# Patient Record
Sex: Female | Born: 2000
Health system: Southern US, Community
[De-identification: ages and names within clinical notes are randomized; demographics above are authoritative.]

## PROBLEM LIST (undated history)

## (undated) ENCOUNTER — Emergency Department: Admission: EM | Payer: Medicaid Other | Source: Home / Self Care

## (undated) ENCOUNTER — Emergency Department (HOSPITAL_COMMUNITY): Admission: EM | Payer: Medicaid Other

## (undated) DIAGNOSIS — E669 Obesity, unspecified: Secondary | ICD-10-CM

## (undated) DIAGNOSIS — E119 Type 2 diabetes mellitus without complications: Secondary | ICD-10-CM

## (undated) DIAGNOSIS — G8929 Other chronic pain: Secondary | ICD-10-CM

## (undated) DIAGNOSIS — E1010 Type 1 diabetes mellitus with ketoacidosis without coma: Secondary | ICD-10-CM

## (undated) DIAGNOSIS — F32A Depression, unspecified: Secondary | ICD-10-CM

## (undated) DIAGNOSIS — R1013 Epigastric pain: Secondary | ICD-10-CM

## (undated) DIAGNOSIS — I509 Heart failure, unspecified: Secondary | ICD-10-CM

## (undated) DIAGNOSIS — N83209 Unspecified ovarian cyst, unspecified side: Secondary | ICD-10-CM

## (undated) DIAGNOSIS — F329 Major depressive disorder, single episode, unspecified: Secondary | ICD-10-CM

## (undated) DIAGNOSIS — F419 Anxiety disorder, unspecified: Secondary | ICD-10-CM

## (undated) DIAGNOSIS — R569 Unspecified convulsions: Secondary | ICD-10-CM

## (undated) DIAGNOSIS — M545 Low back pain, unspecified: Secondary | ICD-10-CM

## (undated) DIAGNOSIS — L83 Acanthosis nigricans: Secondary | ICD-10-CM

## (undated) DIAGNOSIS — R7303 Prediabetes: Secondary | ICD-10-CM

## (undated) DIAGNOSIS — E270 Other adrenocortical overactivity: Secondary | ICD-10-CM

## (undated) HISTORY — PX: HERNIA REPAIR: SHX51

## (undated) HISTORY — DX: Prediabetes: R73.03

## (undated) HISTORY — DX: Acanthosis nigricans: L83

## (undated) HISTORY — DX: Epigastric pain: R10.13

## (undated) HISTORY — PX: TONSILLECTOMY AND ADENOIDECTOMY: SUR1326

## (undated) HISTORY — DX: Other adrenocortical overactivity: E27.0

## (undated) HISTORY — DX: Unspecified ovarian cyst, unspecified side: N83.209

## (undated) HISTORY — PX: ABDOMINAL HERNIA REPAIR: SHX539

## (undated) HISTORY — DX: Obesity, unspecified: E66.9

---
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
## 2018-01-04 ENCOUNTER — Other Ambulatory Visit: Payer: Self-pay | Admitting: Critical Care Medicine

## 2018-01-07 ENCOUNTER — Telehealth: Payer: Self-pay | Admitting: Nurse Practitioner

## 2018-01-07 NOTE — Telephone Encounter (Signed)
Patient called with chest pain I informed her to go to the ER she sated that she already did and that she needs to fu with PCP she has an appointment on 1/14

## 2018-01-08 MED FILL — CEPHALEXIN 500 MG CAPSULE: 500 | 7 days supply | Qty: 21 | Fill #0

## 2018-01-09 ENCOUNTER — Other Ambulatory Visit: Payer: Self-pay

## 2018-01-09 ENCOUNTER — Encounter (HOSPITAL_COMMUNITY): Payer: Self-pay

## 2018-01-09 ENCOUNTER — Emergency Department (HOSPITAL_COMMUNITY): Payer: Medicaid Other

## 2018-01-09 ENCOUNTER — Emergency Department (HOSPITAL_COMMUNITY)
Admission: EM | Admit: 2018-01-09 | Discharge: 2018-01-09 | Disposition: A | Discharge status: Home / Self Care | Payer: Medicaid Other | Attending: Emergency Medicine | Admitting: Emergency Medicine

## 2018-01-09 DIAGNOSIS — I509 Heart failure, unspecified: Secondary | ICD-10-CM | POA: Diagnosis not present

## 2018-01-09 DIAGNOSIS — Z794 Long term (current) use of insulin: Secondary | ICD-10-CM | POA: Diagnosis not present

## 2018-01-09 DIAGNOSIS — E119 Type 2 diabetes mellitus without complications: Secondary | ICD-10-CM | POA: Diagnosis not present

## 2018-01-09 DIAGNOSIS — R1084 Generalized abdominal pain: Secondary | ICD-10-CM | POA: Diagnosis not present

## 2018-01-09 DIAGNOSIS — R109 Unspecified abdominal pain: Secondary | ICD-10-CM

## 2018-01-09 DIAGNOSIS — R072 Precordial pain: Secondary | ICD-10-CM | POA: Insufficient documentation

## 2018-01-09 DIAGNOSIS — Z79899 Other long term (current) drug therapy: Secondary | ICD-10-CM | POA: Insufficient documentation

## 2018-01-09 DIAGNOSIS — I110 Hypertensive heart disease with heart failure: Secondary | ICD-10-CM | POA: Diagnosis not present

## 2018-01-09 DIAGNOSIS — R0789 Other chest pain: Secondary | ICD-10-CM | POA: Diagnosis present

## 2018-01-09 DIAGNOSIS — G8929 Other chronic pain: Secondary | ICD-10-CM | POA: Insufficient documentation

## 2018-01-09 LAB — CBC
HCT: 44.4 % (ref 36.0–46.0)
HEMOGLOBIN: 14.4 g/dL (ref 12.0–15.0)
MCH: 26.9 pg (ref 26.0–34.0)
MCHC: 32.4 g/dL (ref 30.0–36.0)
MCV: 82.8 fL (ref 80.0–100.0)
Platelets: 319 10*3/uL (ref 150–400)
RBC: 5.36 MIL/uL — ABNORMAL HIGH (ref 3.87–5.11)
RDW: 13.8 % (ref 11.5–15.5)
WBC: 7.1 10*3/uL (ref 4.0–10.5)
nRBC: 0 % (ref 0.0–0.2)

## 2018-01-09 LAB — BASIC METABOLIC PANEL
Anion gap: 10 (ref 5–15)
BUN: 8 mg/dL (ref 6–20)
CO2: 24 mmol/L (ref 22–32)
Calcium: 9.3 mg/dL (ref 8.9–10.3)
Chloride: 97 mmol/L — ABNORMAL LOW (ref 98–111)
Creatinine, Ser: 0.64 mg/dL (ref 0.44–1.00)
GFR calc non Af Amer: >60 mL/min (ref >60)
Glucose, Bld: 622 mg/dL (ref 70–99)
POTASSIUM: 3.9 mmol/L (ref 3.5–5.1)
Sodium: 131 mmol/L — ABNORMAL LOW (ref 135–145)

## 2018-01-09 LAB — I-STAT BETA HCG BLOOD, ED (MC, WL, AP ONLY): I-stat hCG, quantitative: <5 m[IU]/mL (ref <5)

## 2018-01-09 LAB — I-STAT TROPONIN, ED
TROPONIN I, POC: 0 ng/mL (ref 0.00–0.08)
Troponin i, poc: 0 ng/mL (ref 0.00–0.08)

## 2018-01-09 LAB — CBG MONITORING, ED: GLUCOSE-CAPILLARY: 245 mg/dL — AB (ref 70–99)

## 2018-01-09 MED ORDER — SODIUM CHLORIDE 0.9 % IV BOLUS
1000.0000 mL | Freq: Once | INTRAVENOUS | Status: AC
  Administered 2018-01-09: 1000 mL via INTRAVENOUS

## 2018-01-09 MED ORDER — INSULIN ASPART 100 UNIT/ML ~~LOC~~ SOLN
10.0000 [IU] | Freq: Once | SUBCUTANEOUS | Status: AC
  Administered 2018-01-09: 10 [IU] via SUBCUTANEOUS

## 2018-01-09 MED ORDER — KETAMINE HCL 50 MG/5ML IJ SOSY
0.3000 mg/kg | PREFILLED_SYRINGE | Freq: Once | INTRAMUSCULAR | Status: AC
  Administered 2018-01-09: 25 mg via INTRAVENOUS
  Filled 2018-01-09: qty 5

## 2018-01-09 MED ORDER — HALOPERIDOL LACTATE 5 MG/ML IJ SOLN
5.0000 mg | Freq: Once | INTRAMUSCULAR | Status: AC
  Administered 2018-01-09: 5 mg via INTRAVENOUS
  Filled 2018-01-09: qty 1

## 2018-01-09 MED ORDER — PROCHLORPERAZINE EDISYLATE 10 MG/2ML IJ SOLN
10.0000 mg | Freq: Once | INTRAMUSCULAR | Status: AC
  Administered 2018-01-09: 10 mg via INTRAVENOUS
  Filled 2018-01-09: qty 2

## 2018-01-09 MED ORDER — DIPHENHYDRAMINE HCL 50 MG/ML IJ SOLN
25.0000 mg | Freq: Once | INTRAMUSCULAR | Status: AC
  Administered 2018-01-09: 25 mg via INTRAVENOUS
  Filled 2018-01-09: qty 1

## 2018-01-09 MED ORDER — PROMETHAZINE HCL 25 MG PO TABS: 25.0000 mg | ORAL_TABLET | Freq: Four times a day (QID) | ORAL | 0 refills | Status: DC | PRN

## 2018-01-09 NOTE — Discharge Instructions (Signed)
You were seen in the ER for chest and abdominal pain.   I reviewed your most recent tests including MRI brain, CT chest, abdomen, pelvis, gastric emptying studies.  Cause of  your symptoms is unclear.   Take [phone] mg acetaminophen for pain. Phenergan for nausea and vomiting.   Follow up with gastroenterology and cardiology for further discussion of your symptoms.   Return to ER for fever, cough, phlegm, blood in vomit or stool, lower extremity swelling.

## 2018-01-09 NOTE — ED Notes (Signed)
Pt tolerating ginger ale, given sandwich. OK per [place], [place]

## 2018-01-09 NOTE — ED Triage Notes (Signed)
To  Triage via EMS.  Onset 3 weeks mid chest pain and abd pain.  Syncopal episode x 3   Pt has been seen in ED and has followup appt 01-19-18

## 2018-01-09 NOTE — ED Provider Notes (Addendum)
[facility] EMERGENCY DEPARTMENT Provider Note   CSN: [id number] Arrival date & time: 01/09/18  1506     History   Chief Complaint Chief Complaint  Patient presents with  . Chest Pain  . Abdominal Pain    HPI Nancy [name] is a 18 y.o. female with h/o poorly controlled DM on insulin, chronic abdominal pain, chronic chest pain, sinus tachycardia, syncope, is here for evaluation of chest pain and abdominal pain. On and off for 1 month. Acutely worsening this morning. It is 10/10, sharp, constant since onset this morning.  Associated with nausea, nbnb emesis.  Her chest pain is described as sharp, left sided, radiating to right chest and left scapula.  Worse with movement and walking around.  Her abdominal pain is sharp, located to umbilicus, non radiating, severe, 10/10.  Last vomited last night.  Nothing makes it better.  Sometimes heating pad to abdomen improves the abdominal pain and nausea. Sometimes she feels short of breath that she describes as "feeling like i'm going to have a panic attack".  States she has passed out more than 10 times this week, states her mom told her she has had 3 syncopal episodes today.  States she "passes out" randomly, sometimes at rest, while talking, standing up. When she wakes up she states she doesn't remember anything.  She has been compliant with lantus 60 units qhs and humalog 10 units BID with meals however last use yesterday.  She denies fevers, chills, cough, diarrhea, melena, dysuria, frequency, urgency. No ETOH or marijuana use. Does not take anything for pain at home such as acetaminophen, ibuprofen. LMP 12/14.  States ketamine helped her symptoms last time.  No h/o DVT/PE, recent prolonged travel, lower extremity edema or calf pain. Recent admission for syncope, chest pain, tachycardia.   HPI  Past Medical History:  Diagnosis Date  . Acanthosis nigricans   . Anxiety   . CHF (congestive heart failure) ([place])   . Chronic lower  back pain   . Depression   . Dyspepsia   . Obesity   . Ovarian cyst    pt is not aware of this hx (11/24/2017)  . Pre-diabetes   . 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 Premature baby   . Type II diabetes mellitus ([facility])    insulin dependant    Patient Active Problem List   Diagnosis Date Noted  . Chronic abdominal pain 12/24/2017  . Sinus tachycardia by electrocardiogram   . Acute lower UTI   . Uncontrolled type 2 diabetes mellitus with hyperglycemia ([place])   . Chest pain 12/19/2017  . Generalized abdominal pain 08/21/2017  . Non compliance with medical treatment 01/27/2012  . Adjustment disorder 09/16/2011  . Dyspepsia   . Acanthosis nigricans   . Goiter   . Obesity 06/14/2010  . Hypertension 06/14/2010    Past Surgical History:  Procedure Laterality Date  . ABDOMINAL HERNIA REPAIR     "I was a baby"  . HERNIA REPAIR    . TONSILLECTOMY AND ADENOIDECTOMY    . WISDOM TOOTH EXTRACTION  2017     OB History   No obstetric history on file.      Home Medications    Prior to Admission medications   Medication Sig Start Date End Date Taking? Authorizing Provider  amLODipine (NORVASC) 5 MG tablet Take 1 tablet (5 mg total) by mouth daily. 12/02/17   [name], NP  Blood Glucose Monitoring Suppl (TRUE METRIX METER) w/Device KIT Use as  instructed. Monitor blood glucose levels twice per day 12/02/17   [name], NP  diclofenac sodium (VOLTAREN) 1 % GEL Apply 2 g topically 4 (four) times daily. Patient taking differently: Apply 2 g topically See admin instructions. Apply 2 grams to painful areas of chest and/or stomach four times a day 12/20/17   Debbe Odea, MD  glucagon 1 MG injection Use for Severe Hypoglycemia . Inject 1 mg intramuscularly if unresponsive, unable to swallow, unconscious and/or has seizure Patient not taking: Reported on 12/28/2017 07/20/13 12/19/25  Renato Shin, MD  glucose blood (TRUE METRIX BLOOD GLUCOSE TEST) test strip Use as instructed  12/02/17   [name], NP  Insulin Glargine (LANTUS) 100 UNIT/ML Solostar Pen Inject 60 Units into the skin daily. And pen needles 2/day Patient taking differently: Inject 60 Units into the skin at bedtime.  12/02/17   [name], NP  insulin lispro (HUMALOG) 100 UNIT/ML injection Inject 10 Units into the skin 2 (two) times daily with a meal.     [provider]  Insulin Pen Needle (B-D UF III MINI PEN NEEDLES) 31G X 5 MM MISC Use as instructed. Monitor blood glucose levels twice per day 12/02/17   [name], NP  meloxicam (MOBIC) 15 MG tablet Take 1 tablet (15 mg total) by mouth daily. 12/28/17   Elsie Stain, MD  metoprolol tartrate (LOPRESSOR) 25 MG tablet Take 25 mg by mouth 2 (two) times daily.    [provider]  promethazine (PHENERGAN) 25 MG tablet Take 1 tablet (25 mg total) by mouth every 6 (six) hours as needed for nausea or vomiting. 01/09/18   Kinnie Feil, PA-C  QUEtiapine (SEROQUEL) 100 MG tablet Take 100 mg by mouth at bedtime.    [provider]  TRUEPLUS LANCETS 28G MISC Use as instructed. Monitor blood glucose levels twice per day 12/02/17   [name], NP    Family History Family History  Problem Relation Age of Onset  . Diabetes Mother   . Hypertension Mother   . Obesity Mother   . Asthma Mother   . Allergic rhinitis Mother   . Eczema Mother   . Diabetes Father   . Hypertension Father   . Obesity Father   . Hyperlipidemia Father   . Hypertension Paternal Aunt   . Hypertension Maternal Grandfather   . Colon cancer Maternal Grandfather   . Diabetes Paternal Grandmother   . Obesity Paternal Grandmother   . Diabetes Paternal Grandfather   . Obesity Paternal Grandfather   . Angioedema Neg Hx   . Immunodeficiency Neg Hx   . Urticaria Neg Hx   . Stomach cancer Neg Hx   . Esophageal cancer Neg Hx     Social History Social History   Tobacco Use  . Smoking status: Never Smoker  . Smokeless tobacco:  Never Used  Substance Use Topics  . Alcohol use: No    Alcohol/week: 0.0 standard drinks  . Drug use: No     Allergies   Patient has no known allergies.   Review of Systems Review of Systems  Respiratory: Positive for chest tightness and shortness of breath.   Cardiovascular: Positive for chest pain.  Gastrointestinal: Positive for abdominal pain, nausea and vomiting.  Neurological: Positive for syncope and light-headedness.  All other systems reviewed and are negative.    Physical Exam Updated Vital Signs BP 118/76   Pulse (!) 112   Temp 98.9 F (37.2 C) (Oral)   Resp  18   Wt 83 kg   LMP 12/19/2017   SpO2 100%   BMI 32.41 kg/m   Physical Exam Constitutional:      Appearance: She is well-developed.     Comments: NAD. Non toxic.   HENT:     Head: Normocephalic and atraumatic.     Nose: Nose normal.  Eyes:     General: Lids are normal.     Conjunctiva/sclera: Conjunctivae normal.  Neck:     Musculoskeletal: Normal range of motion.     Trachea: Trachea normal.     Comments: Trachea midline.  Cardiovascular:     Rate and Rhythm: Regular rhythm. Tachycardia present.     Pulses:          Carotid pulses are 2+ on the right side and 2+ on the left side.      Radial pulses are 2+ on the right side and 2+ on the left side.       Dorsalis pedis pulses are 2+ on the right side and 2+ on the left side.     Heart sounds: Normal heart sounds, S1 normal and S2 normal.     Comments: Slight left chest wall and scapular tenderness. No overlaying rash. Equal rise/fall of chest. No LE edema or calf tenderness.  Pulmonary:     Effort: Pulmonary effort is normal.     Breath sounds: Normal breath sounds.  Abdominal:     General: Bowel sounds are normal.     Palpations: Abdomen is soft.     Tenderness: There is abdominal tenderness.     Comments: Generalized abdominal tenderness, mild. No rigidity, guarding. Negative Murphy's and McBurneys. No CVA or suprapubic tenderness.     Skin:    General: Skin is warm and dry.     Capillary Refill: Capillary refill takes less than 2 seconds.     Comments: No rash to chest wall  Neurological:     Mental Status: She is alert.     GCS: GCS eye subscore is 4. GCS verbal subscore is 5. GCS motor subscore is 6.  Psychiatric:        Speech: Speech normal.        Behavior: Behavior normal.        Thought Content: Thought content normal.      ED Treatments / Results  Labs (all labs ordered are listed, but only abnormal results are displayed) Labs Reviewed  BASIC METABOLIC PANEL - Abnormal; Notable for the following components:      Result Value   Sodium 131 (*)    Chloride 97 (*)    Glucose, Bld 622 (*)    All other components within normal limits  CBC - Abnormal; Notable for the following components:   RBC 5.36 (*)    All other components within normal limits  CBG MONITORING, ED - Abnormal; Notable for the following components:   Glucose-Capillary 245 (*)    All other components within normal limits  I-STAT TROPONIN, ED  I-STAT BETA HCG BLOOD, ED ([facility], [facility], [facility] ONLY)  I-STAT TROPONIN, ED    EKG None  Radiology Dg Chest 2 View  Result Date: 01/09/2018 CLINICAL DATA:  Chest pain, shortness of breath and vomiting. EXAM: CHEST - 2 VIEW COMPARISON:  12/23/2017 FINDINGS: The heart size and mediastinal contours are within normal limits. There is no evidence of pulmonary edema, consolidation, pneumothorax, nodule or pleural fluid. The visualized skeletal structures are unremarkable. IMPRESSION: No active cardiopulmonary disease. Electronically Signed   By:  Aletta Edouard M.D.   On: 01/09/2018 15:58    Procedures Procedures (including critical care time)  Medications Ordered in ED Medications  sodium chloride 0.9 % bolus 1,000 mL (0 mLs Intravenous Stopped 01/09/18 1912)  insulin aspart (novoLOG) injection 10 Units (10 Units Subcutaneous Given 01/09/18 1806)  haloperidol lactate (HALDOL) injection 5 mg (5 mg Intravenous  Given 01/09/18 1924)  prochlorperazine (COMPAZINE) injection 10 mg (10 mg Intravenous Given 01/09/18 1924)  diphenhydrAMINE (BENADRYL) injection 25 mg (25 mg Intravenous Given 01/09/18 1924)  ketamine 50 mg in normal saline 5 mL (10 mg/mL) syringe (25 mg Intravenous Given 01/09/18 2044)     Initial Impression / Assessment and Plan / ED Course  I have reviewed the triage vital signs and the nursing notes.  Pertinent labs & imaging results that were available during my care of the patient were reviewed by me and considered in my medical decision making (see chart for details).    18 yo with acute on chronic chest and abd pain, nausea, vomiting.   Pt was admitted/discharged on 12/21 for syncope, chest pain, tachycardia, abdominal pain. At that time echo showed EF 45% with global hypokinesis, cardiology was consulted and recommended outpatient work up.  She has upcoming coronary cardiac CT on 01/09.   CTA and d-dimer last month negative.  MRI brain 11/2017 negative, unclear etiology of syncope. Normal gastric emptying study 05/2017. CT [facility] last month unremarkable. On exam today she has generalized abd tenderness but no peritonitis.  Cardiopulmonary exam unremarkable w/o signs of DVT.  She is tachycardic but this is at her baseline. No tachypnea, hypoxia.  I doubt cardiac ischemia, PE, acute intraabdominal process given recent extensive work up.  She is afebrile.    No leukocytosis.  Trop x 2 undetectable during constant CP.  CXR w/o infection or pulmonary edema.  Hyperglycemia w/o DKA/HHS.  CBG improved with IVF and insulin.  Cp/Abd pain and tenderness on exam improved with ketamine. She had full meal in ER w/o emesis.  Tachycardia improved.  We will dc with rec to f/u with cardiology and GI for ongoing management of chronic CP/abdominal pain. Return precautions discussed. Pt is comfortable with this plan.   Final Clinical Impressions(s) / ED Diagnoses   Final diagnoses:  Precordial pain  Chronic abdominal  pain    ED Discharge Orders         Ordered    promethazine (PHENERGAN) 25 MG tablet  Every 6 hours PRN     01/09/18 2114           Kinnie Feil, PA-C 01/09/18 2117    Margette Fast, MD 01/10/18 8502    Kinnie Feil, PA-C 01/28/18 1934    Long, [place] [employer], MD 01/30/18 [phone]

## 2018-01-13 ENCOUNTER — Telehealth (HOSPITAL_COMMUNITY): Payer: Self-pay | Admitting: Emergency Medicine

## 2018-01-13 NOTE — Telephone Encounter (Signed)
Reaching out to patient to offer assistance regarding upcoming cardiac imaging study; pt verbalizes understanding of appt date/time, parking situation and where to check in, pre-test NPO status and medications ordered, and verified current allergies; name and call back number provided for further questions should they arise Rodriques Badie RN Navigator Cardiac Imaging [phone] 

## 2018-01-14 ENCOUNTER — Ambulatory Visit (HOSPITAL_COMMUNITY)
Admission: RE | Admit: 2018-01-14 | Discharge: 2018-01-14 | Disposition: A | Discharge status: Home / Self Care | Payer: Medicaid Other | Source: Ambulatory Visit | Attending: Critical Care Medicine | Admitting: Critical Care Medicine

## 2018-01-14 DIAGNOSIS — Z006 Encounter for examination for normal comparison and control in clinical research program: Secondary | ICD-10-CM

## 2018-01-14 DIAGNOSIS — R072 Precordial pain: Secondary | ICD-10-CM

## 2018-01-14 DIAGNOSIS — I10 Essential (primary) hypertension: Secondary | ICD-10-CM

## 2018-01-14 MED ORDER — NITROGLYCERIN 0.4 MG SL SUBL
SUBLINGUAL_TABLET | SUBLINGUAL | Status: AC
  Filled 2018-01-14: qty 2

## 2018-01-14 MED ORDER — DILTIAZEM HCL 25 MG/5ML IV SOLN
5.0000 mg | Freq: Once | INTRAVENOUS | Status: AC
  Administered 2018-01-14: 5 mg via INTRAVENOUS
  Filled 2018-01-14: qty 5

## 2018-01-14 MED ORDER — IOPAMIDOL (ISOVUE-370) INJECTION 76%
100.0000 mL | Freq: Once | INTRAVENOUS | Status: AC | PRN
  Administered 2018-01-14: 100 mL via INTRAVENOUS

## 2018-01-14 MED ORDER — DILTIAZEM HCL 25 MG/5ML IV SOLN
INTRAVENOUS | Status: AC
  Administered 2018-01-14: 5 mg
  Filled 2018-01-14: qty 5

## 2018-01-14 MED ORDER — METOPROLOL TARTRATE 5 MG/5ML IV SOLN
INTRAVENOUS | Status: AC
  Filled 2018-01-14: qty 10

## 2018-01-14 MED ORDER — DILTIAZEM HCL 25 MG/5ML IV SOLN
10.0000 mg | Freq: Once | INTRAVENOUS | Status: AC
  Administered 2018-01-14: 5 mg via INTRAVENOUS
  Filled 2018-01-14: qty 5

## 2018-01-14 MED ORDER — DILTIAZEM LOAD VIA INFUSION: 10.0000 mg | Freq: Once | INTRAVENOUS | Status: DC

## 2018-01-14 MED ORDER — NITROGLYCERIN 0.4 MG SL SUBL
0.8000 mg | SUBLINGUAL_TABLET | Freq: Once | SUBLINGUAL | Status: AC
  Administered 2018-01-14: 0.8 mg via SUBLINGUAL
  Filled 2018-01-14: qty 25

## 2018-01-14 MED ORDER — METOPROLOL TARTRATE 5 MG/5ML IV SOLN
10.0000 mg | INTRAVENOUS | Status: DC | PRN
  Administered 2018-01-14: 10 mg via INTRAVENOUS
  Filled 2018-01-14: qty 10

## 2018-01-14 NOTE — Research (Signed)
[name] met inclusion and exclusion criteria.  The informed consent form, study requirements and expectations were reviewed with the subject and questions and concerns were addressed prior to the signing of the consent form.  The subject verbalized understanding of the trial requirements.  The subject agreed to participate in the [email or web address] trial and signed the informed consent.  The informed consent was obtained prior to performance of any protocol-specific procedures for the subject.  A copy of the signed informed consent was given to the subject and a copy was placed in the subject's medical record.   Dionne Bucy. [employer]

## 2018-01-14 NOTE — Progress Notes (Signed)
CT scan completed. Tolerated well. D/C home walking with mother. Awake and alert. In no distress. 

## 2018-01-16 ENCOUNTER — Encounter (HOSPITAL_COMMUNITY): Payer: Self-pay

## 2018-01-16 ENCOUNTER — Other Ambulatory Visit: Payer: Self-pay

## 2018-01-16 ENCOUNTER — Emergency Department (HOSPITAL_COMMUNITY)
Admission: EM | Admit: 2018-01-16 | Discharge: 2018-01-16 | Disposition: A | Discharge status: Home / Self Care | Payer: Medicaid Other | Attending: Emergency Medicine | Admitting: Emergency Medicine

## 2018-01-16 ENCOUNTER — Emergency Department (HOSPITAL_COMMUNITY): Payer: Medicaid Other

## 2018-01-16 DIAGNOSIS — R000 Tachycardia, unspecified: Secondary | ICD-10-CM

## 2018-01-16 DIAGNOSIS — I110 Hypertensive heart disease with heart failure: Secondary | ICD-10-CM | POA: Diagnosis not present

## 2018-01-16 DIAGNOSIS — R0789 Other chest pain: Secondary | ICD-10-CM | POA: Diagnosis not present

## 2018-01-16 DIAGNOSIS — Z79899 Other long term (current) drug therapy: Secondary | ICD-10-CM | POA: Insufficient documentation

## 2018-01-16 DIAGNOSIS — I509 Heart failure, unspecified: Secondary | ICD-10-CM | POA: Diagnosis not present

## 2018-01-16 DIAGNOSIS — R072 Precordial pain: Secondary | ICD-10-CM | POA: Diagnosis not present

## 2018-01-16 DIAGNOSIS — Z794 Long term (current) use of insulin: Secondary | ICD-10-CM | POA: Diagnosis not present

## 2018-01-16 DIAGNOSIS — E860 Dehydration: Secondary | ICD-10-CM | POA: Diagnosis not present

## 2018-01-16 DIAGNOSIS — R739 Hyperglycemia, unspecified: Secondary | ICD-10-CM

## 2018-01-16 DIAGNOSIS — E1165 Type 2 diabetes mellitus with hyperglycemia: Secondary | ICD-10-CM | POA: Insufficient documentation

## 2018-01-16 LAB — CBC WITH DIFFERENTIAL/PLATELET
Abs Immature Granulocytes: 0.03 10*3/uL (ref 0.00–0.07)
Basophils Absolute: 0 10*3/uL (ref 0.0–0.1)
Basophils Relative: 1 %
Eosinophils Absolute: 0.1 10*3/uL (ref 0.0–0.5)
Eosinophils Relative: 1 %
HCT: 44.4 % (ref 36.0–46.0)
Hemoglobin: 13.7 g/dL (ref 12.0–15.0)
IMMATURE GRANULOCYTES: 0 %
Lymphocytes Relative: 29 %
Lymphs Abs: 2.3 10*3/uL (ref 0.7–4.0)
MCH: 26.7 pg (ref 26.0–34.0)
MCHC: 30.9 g/dL (ref 30.0–36.0)
MCV: 86.4 fL (ref 80.0–100.0)
Monocytes Absolute: 0.8 10*3/uL (ref 0.1–1.0)
Monocytes Relative: 10 %
Neutro Abs: 4.7 10*3/uL (ref 1.7–7.7)
Neutrophils Relative %: 59 %
Platelets: 250 10*3/uL (ref 150–400)
RBC: 5.14 MIL/uL — ABNORMAL HIGH (ref 3.87–5.11)
RDW: 14.2 % (ref 11.5–15.5)
WBC: 7.9 10*3/uL (ref 4.0–10.5)
nRBC: 0 % (ref 0.0–0.2)

## 2018-01-16 LAB — COMPREHENSIVE METABOLIC PANEL
ALT: 36 U/L (ref 0–44)
AST: 26 U/L (ref 15–41)
Albumin: 3.9 g/dL (ref 3.5–5.0)
Alkaline Phosphatase: 74 U/L (ref 38–126)
Anion gap: 15 (ref 5–15)
BUN: 6 mg/dL (ref 6–20)
CHLORIDE: 98 mmol/L (ref 98–111)
CO2: 19 mmol/L — ABNORMAL LOW (ref 22–32)
Calcium: 9.4 mg/dL (ref 8.9–10.3)
Creatinine, Ser: 0.78 mg/dL (ref 0.44–1.00)
GFR calc Af Amer: >60 mL/min (ref >60)
GFR calc non Af Amer: >60 mL/min (ref >60)
Glucose, Bld: 579 mg/dL (ref 70–99)
POTASSIUM: 4.1 mmol/L (ref 3.5–5.1)
Sodium: 132 mmol/L — ABNORMAL LOW (ref 135–145)
Total Bilirubin: 0.4 mg/dL (ref 0.3–1.2)
Total Protein: 6.6 g/dL (ref 6.5–8.1)

## 2018-01-16 LAB — I-STAT VENOUS BLOOD GAS, ED
Acid-base deficit: 4 mmol/L — ABNORMAL HIGH (ref 0.0–2.0)
Bicarbonate: 21.6 mmol/L (ref 20.0–28.0)
O2 Saturation: 76 %
TCO2: 23 mmol/L (ref 22–32)
pCO2, Ven: 39 mmHg — ABNORMAL LOW (ref 44.0–60.0)
pH, Ven: 7.352 (ref 7.250–7.430)
pO2, Ven: 43 mmHg (ref 32.0–45.0)

## 2018-01-16 LAB — URINALYSIS, ROUTINE W REFLEX MICROSCOPIC
Bilirubin Urine: NEGATIVE
Glucose, UA: >=500 mg/dL — AB
Ketones, ur: 5 mg/dL — AB
Leukocytes, UA: NEGATIVE
Nitrite: NEGATIVE
Protein, ur: NEGATIVE mg/dL
Specific Gravity, Urine: 1.039 — ABNORMAL HIGH (ref 1.005–1.030)
pH: 6 (ref 5.0–8.0)

## 2018-01-16 LAB — I-STAT TROPONIN, ED
TROPONIN I, POC: 0.01 ng/mL (ref 0.00–0.08)
Troponin i, poc: 0 ng/mL (ref 0.00–0.08)

## 2018-01-16 LAB — MAGNESIUM: Magnesium: 2 mg/dL (ref 1.7–2.4)

## 2018-01-16 LAB — CBG MONITORING, ED
Glucose-Capillary: 307 mg/dL — ABNORMAL HIGH (ref 70–99)
Glucose-Capillary: 272 mg/dL — ABNORMAL HIGH (ref 70–99)

## 2018-01-16 LAB — POC URINE PREG, ED: Preg Test, Ur: NEGATIVE

## 2018-01-16 LAB — BRAIN NATRIURETIC PEPTIDE: B Natriuretic Peptide: 5.6 pg/mL (ref 0.0–100.0)

## 2018-01-16 LAB — TSH: TSH: 0.881 u[IU]/mL (ref 0.350–4.500)

## 2018-01-16 LAB — LIPASE, BLOOD: Lipase: 25 U/L (ref 11–51)

## 2018-01-16 MED ORDER — ONDANSETRON HCL 4 MG/2ML IJ SOLN
4.0000 mg | Freq: Once | INTRAMUSCULAR | Status: AC
  Administered 2018-01-16: 4 mg via INTRAVENOUS
  Filled 2018-01-16: qty 2

## 2018-01-16 MED ORDER — TRAMADOL HCL 50 MG PO TABS: 50.0000 mg | ORAL_TABLET | Freq: Four times a day (QID) | ORAL | 0 refills | Status: DC | PRN

## 2018-01-16 MED ORDER — FENTANYL CITRATE (PF) 100 MCG/2ML IJ SOLN
50.0000 ug | Freq: Once | INTRAMUSCULAR | Status: AC
  Administered 2018-01-16: 50 ug via INTRAVENOUS
  Filled 2018-01-16: qty 2

## 2018-01-16 MED ORDER — SODIUM CHLORIDE 0.9 % IV BOLUS
1000.0000 mL | Freq: Once | INTRAVENOUS | Status: AC
  Administered 2018-01-16: 1000 mL via INTRAVENOUS

## 2018-01-16 MED ORDER — PROCHLORPERAZINE MALEATE 10 MG PO TABS: 10.0000 mg | ORAL_TABLET | Freq: Two times a day (BID) | ORAL | 0 refills | Status: DC | PRN

## 2018-01-16 NOTE — ED Notes (Signed)
Pt still not able to give a urine sample at this time

## 2018-01-16 NOTE — ED Notes (Signed)
Pt ambulated independently to bathroom to provide urine sample at this time.

## 2018-01-16 NOTE — ED Triage Notes (Signed)
Pt c/o chest pain radiating to the back and also c/o SOB / with 2 syncopal episodes x2 ; generalized abd pain /n/v; pt states she has been dealing with the same symptoms on and off x couple of months

## 2018-01-16 NOTE — ED Provider Notes (Signed)
[facility] Provider Note   CSN: [id number] Arrival date & time: 01/16/18  1644     History   Chief Complaint Chief Complaint  Patient presents with  . Chest Pain  . Abdominal Pain    HPI Nancy [name] is a 18 y.o. female.  The history is provided by the patient and medical records.  Chest Pain  Pain location:  Substernal area and L chest Pain quality: crushing and pressure   Pain radiates to:  Does not radiate Pain severity:  Severe Onset quality:  Gradual Duration:  1 month Timing:  Constant Progression:  Waxing and waning Relieved by:  Nothing Worsened by:  Nothing Associated symptoms: abdominal pain, anxiety, fatigue, nausea, palpitations, shortness of breath, syncope and vomiting   Associated symptoms: no altered mental status, no back pain, no cough, no diaphoresis, no dizziness, no fever, no heartburn, no lower extremity edema and no numbness   Risk factors: diabetes mellitus   Abdominal Pain  Associated symptoms: chest pain, fatigue, nausea, shortness of breath and vomiting   Associated symptoms: no chills, no constipation, no cough, no diarrhea, no dysuria and no fever     Past Medical History:  Diagnosis Date  . Acanthosis nigricans   . Anxiety   . CHF (congestive heart failure) ([place])   . Chronic lower back pain   . Depression   . Dyspepsia   . Obesity   . Ovarian cyst    pt is not aware of this hx (11/24/2017)  . Pre-diabetes   . 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 Premature baby   . Type II diabetes mellitus ([facility])    insulin dependant    Patient Active Problem List   Diagnosis Date Noted  . Chronic abdominal pain 12/24/2017  . Sinus tachycardia by electrocardiogram   . Acute lower UTI   . Uncontrolled type 2 diabetes mellitus with hyperglycemia ([place])   . Chest pain 12/19/2017  . Generalized abdominal pain 08/21/2017  . Non compliance with medical treatment 01/27/2012  . Adjustment disorder 09/16/2011    . Dyspepsia   . Acanthosis nigricans   . Goiter   . Obesity 06/14/2010  . Hypertension 06/14/2010    Past Surgical History:  Procedure Laterality Date  . ABDOMINAL HERNIA REPAIR     "I was a baby"  . HERNIA REPAIR    . TONSILLECTOMY AND ADENOIDECTOMY    . WISDOM TOOTH EXTRACTION  2017     OB History   No obstetric history on file.      Home Medications    Prior to Admission medications   Medication Sig Start Date End Date Taking? Authorizing Provider  amLODipine (NORVASC) 5 MG tablet Take 1 tablet (5 mg total) by mouth daily. 12/02/17   [name], NP  Blood Glucose Monitoring Suppl (TRUE METRIX METER) w/Device KIT Use as instructed. Monitor blood glucose levels twice per day 12/02/17   [name], NP  diclofenac sodium (VOLTAREN) 1 % GEL Apply 2 g topically 4 (four) times daily. Patient taking differently: Apply 2 g topically See admin instructions. Apply 2 grams to painful areas of chest and/or stomach four times a day 12/20/17   Debbe Odea, MD  glucagon 1 MG injection Use for Severe Hypoglycemia . Inject 1 mg intramuscularly if unresponsive, unable to swallow, unconscious and/or has seizure 07/20/13 12/19/25  Renato Shin, MD  glucose blood (TRUE METRIX BLOOD GLUCOSE TEST) test strip Use as instructed 12/02/17   [name],  NP  Insulin Glargine (LANTUS) 100 UNIT/ML Solostar Pen Inject 60 Units into the skin daily. And pen needles 2/day Patient taking differently: Inject 60 Units into the skin at bedtime.  12/02/17   [name], NP  insulin lispro (HUMALOG) 100 UNIT/ML injection Inject 10 Units into the skin 2 (two) times daily with a meal.     [provider]  Insulin Pen Needle (B-D UF III MINI PEN NEEDLES) 31G X 5 MM MISC Use as instructed. Monitor blood glucose levels twice per day 12/02/17   [name], NP  meloxicam (MOBIC) 15 MG tablet Take 1 tablet (15 mg total) by mouth daily. Patient not taking: Reported on 01/14/2018 12/28/17    Elsie Stain, MD  metoprolol tartrate (LOPRESSOR) 25 MG tablet Take 25 mg by mouth 2 (two) times daily.    [provider]  promethazine (PHENERGAN) 25 MG tablet Take 1 tablet (25 mg total) by mouth every 6 (six) hours as needed for nausea or vomiting. 01/09/18   Kinnie Feil, PA-C  QUEtiapine (SEROQUEL) 100 MG tablet Take 100 mg by mouth at bedtime.    [provider]  TRUEPLUS LANCETS 28G MISC Use as instructed. Monitor blood glucose levels twice per day 12/02/17   [name], NP    Family History Family History  Problem Relation Age of Onset  . Diabetes Mother   . Hypertension Mother   . Obesity Mother   . Asthma Mother   . Allergic rhinitis Mother   . Eczema Mother   . Diabetes Father   . Hypertension Father   . Obesity Father   . Hyperlipidemia Father   . Hypertension Paternal Aunt   . Hypertension Maternal Grandfather   . Colon cancer Maternal Grandfather   . Diabetes Paternal Grandmother   . Obesity Paternal Grandmother   . Diabetes Paternal Grandfather   . Obesity Paternal Grandfather   . Angioedema Neg Hx   . Immunodeficiency Neg Hx   . Urticaria Neg Hx   . Stomach cancer Neg Hx   . Esophageal cancer Neg Hx     Social History Social History   Tobacco Use  . Smoking status: Never Smoker  . Smokeless tobacco: Never Used  Substance Use Topics  . Alcohol use: No    Alcohol/week: 0.0 standard drinks  . Drug use: No     Allergies   Patient has no known allergies.   Review of Systems Review of Systems  Constitutional: Positive for fatigue. Negative for chills, diaphoresis and fever.  HENT: Negative for congestion.   Eyes: Negative for visual disturbance.  Respiratory: Positive for chest tightness and shortness of breath. Negative for cough, wheezing and stridor.   Cardiovascular: Positive for chest pain, palpitations and syncope. Negative for leg swelling.  Gastrointestinal: Positive for abdominal pain, nausea and  vomiting. Negative for constipation, diarrhea and heartburn.  Genitourinary: Negative for dysuria and flank pain.  Musculoskeletal: Negative for back pain, neck pain and neck stiffness.  Skin: Negative for rash and wound.  Neurological: Positive for syncope and light-headedness. Negative for dizziness and numbness.  Psychiatric/Behavioral: Negative for agitation.  All other systems reviewed and are negative.    Physical Exam Updated Vital Signs BP 135/83 (BP Location: Right Arm)   Pulse (!) 133   Temp 98.7 F (37.1 C) (Oral)   Resp (!) 30   Ht 5' 3"  (1.6 m)   Wt 83 kg   LMP 12/19/2017   SpO2 100%   BMI 32.42  kg/m   Physical Exam Vitals signs and nursing note reviewed.  Constitutional:      General: She is not in acute distress.    Appearance: She is well-developed. She is not ill-appearing, toxic-appearing or diaphoretic.  HENT:     Head: Normocephalic and atraumatic.  Eyes:     Conjunctiva/sclera: Conjunctivae normal.     Pupils: Pupils are equal, round, and reactive to light.  Neck:     Musculoskeletal: Neck supple.  Cardiovascular:     Rate and Rhythm: Regular rhythm. Tachycardia present.     Heart sounds: No murmur.  Pulmonary:     Effort: Pulmonary effort is normal. No respiratory distress.     Breath sounds: Normal breath sounds. No decreased breath sounds, wheezing, rhonchi or rales.  Abdominal:     Palpations: Abdomen is soft.     Tenderness: There is no abdominal tenderness.  Musculoskeletal:     Right lower leg: She exhibits no tenderness. No edema.     Left lower leg: She exhibits no tenderness. No edema.  Skin:    General: Skin is warm and dry.     Capillary Refill: Capillary refill takes less than 2 seconds.     Findings: No erythema or rash.  Neurological:     General: No focal deficit present.     Mental Status: She is alert and oriented to person, place, and time.     Cranial Nerves: No cranial nerve deficit.  Psychiatric:        Mood and  Affect: Mood normal.      ED Treatments / Results  Labs (all labs ordered are listed, but only abnormal results are displayed) Labs Reviewed  CBC WITH DIFFERENTIAL/PLATELET - Abnormal; Notable for the following components:      Result Value   RBC 5.14 (*)    All other components within normal limits  COMPREHENSIVE METABOLIC PANEL - Abnormal; Notable for the following components:   Sodium 132 (*)    CO2 19 (*)    Glucose, Bld 579 (*)    All other components within normal limits  URINALYSIS, ROUTINE W REFLEX MICROSCOPIC - Abnormal; Notable for the following components:   Color, Urine STRAW (*)    Specific Gravity, Urine 1.039 (*)    Glucose, UA >=500 (*)    Hgb urine dipstick LARGE (*)    Ketones, ur 5 (*)    Bacteria, UA RARE (*)    All other components within normal limits  I-STAT VENOUS BLOOD GAS, ED - Abnormal; Notable for the following components:   pCO2, Ven 39.0 (*)    Acid-base deficit 4.0 (*)    All other components within normal limits  CBG MONITORING, ED - Abnormal; Notable for the following components:   Glucose-Capillary 307 (*)    All other components within normal limits  CBG MONITORING, ED - Abnormal; Notable for the following components:   Glucose-Capillary 272 (*)    All other components within normal limits  URINE CULTURE  BRAIN NATRIURETIC PEPTIDE  LIPASE, BLOOD  TSH  MAGNESIUM  I-STAT TROPONIN, ED  POC URINE PREG, ED  I-STAT TROPONIN, ED    EKG EKG Interpretation  Date/Time:  Saturday January 16 2018 16:57:06 EST Ventricular Rate:  131 PR Interval:    QRS Duration: 84 QT Interval:  308 QTC Calculation: 455 R Axis:   47 Text Interpretation:  Sinus tachycardia Multiple ventricular premature complexes ST elev, probable normal early repol pattern When compared to prior, similar tachycardia.  No STEMI  Confirmed by Antony Blackbird [phone]) on 01/16/2018 5:25:19 PM   Radiology Dg Chest 2 View  Result Date: 01/16/2018 CLINICAL DATA:  Chest pain  and shortness of breath. Two recent syncopal episodes. EXAM: CHEST - 2 VIEW COMPARISON:  01/09/2018 FINDINGS: The heart size and mediastinal contours are within normal limits. Both lungs are clear. The visualized skeletal structures are unremarkable. IMPRESSION: Negative.  No active cardiopulmonary disease. Electronically Signed   By: Earle Gell M.D.   On: 01/16/2018 17:46    Procedures Procedures (including critical care time)  Medications Ordered in ED Medications  sodium chloride 0.9 % bolus 1,000 mL (0 mLs Intravenous Stopped 01/16/18 1851)  fentaNYL (SUBLIMAZE) injection 50 mcg (50 mcg Intravenous Given 01/16/18 1859)  fentaNYL (SUBLIMAZE) injection 50 mcg (50 mcg Intravenous Given 01/16/18 2009)  sodium chloride 0.9 % bolus 1,000 mL (0 mLs Intravenous Stopped 01/16/18 2120)  ondansetron (ZOFRAN) injection 4 mg (4 mg Intravenous Given 01/16/18 2010)     Initial Impression / Assessment and Plan / ED Course  I have reviewed the triage vital signs and the nursing notes.  Pertinent labs & imaging results that were available during my care of the patient were reviewed by me and considered in my medical decision making (see chart for details).     EDLA PARA is a 18 y.o. female with a past medical history significant for uncontrolled diabetes, recent echocardiogram showing CHF, and extensive work-up for chest pain who presents with continued chest pain, syncopal episodes, nausea, vomiting, and lightheadedness.  Patient reports that for the last month she has been having significant chest pain.  She reports that she has had work-up including a CT scan which ruled out pulmonary blizzard, CT scan of the abdomen and pelvis and chest which did not show significant realities, recently had a coronary CT 2 days ago showing 0 on the calcium score and no evidence of CAD, and has also been admitted for the chest pain and syncopal episodes.  It is unclear why the patient is having the symptoms however  she says that she is passing out nearly every day and today passed out twice.  She reports that in the context of the passing out she has palpitations and chest tightness and chest pressure.  She reports it is a squeezing pain in her central chest.  She reports she has had no recent fevers or chills.  No significant cough but she has had these shortness of breath.  She is unsure if it is pleuritic or exertional.  She reports she is not sure how long she passed out for.  She has not been able to eat or drink much due to the nausea and vomiting that has been ongoing with it.  She denies any urinary symptoms constipation or diarrhea.  She denies recent trauma.  She reports the chest pain gets up to a 10 out of 10 but is currently a 9 out of 10 on arrival.  Vital signs on arrival show a heart rate of around 140.  Patient has some tachypnea.  Blood pressure is not hypotensive and patient is afebrile.  On exam, patient has no murmur.  Chest is slightly tender.  Lungs are clear.  Patient has no significant edema in her legs.  Symmetric radial pulses.  Abdomen nontender.  Unclear etiology of the patient's symptoms.  As she has recently had work-up showing no evidence of PE, dissection, mass, pneumonia, or other abnormalities, we will start with a chest x-ray and labs.  Will have labs to rule out DKA given her lack of eating and drinking and nausea and vomiting.  Given her continued chest pain, syncopal episodes, and her heart rate in the 140s, patient may require admission for further management of her discomfort despite unclear etiology at this time.  Anticipate reassessment after fluids and work-up.  10:04 PM After 1 L fluids heart rate improved, after second liter he continued to improve.  Rate was around 100 on my last assessment.  Glucose had improved from over 500 to into the 200s.  Troponin was negative x2.  Urinalysis showed no UTI.  Patient is not in DKA.  BNP not elevated.  TSH normal.  No kidney  injury.  No leukocytosis or anemia.  Chest x-ray was reassuring.  Patient reports he is still having the discomfort however it improved slightly after the fentanyl.  It is unclear the etiology given the reassuring work-up thus far.  Patient says she is seeing her doctor in the next 3 days.  I suspect she is dehydrated in setting of nausea and vomiting and making her have worsened symptoms.  As she is now rehydrated and sugars are improving, we feel she is safe for discharge home.  Do not feel she needs admission given the extensively reassuring recent cardiac work-up.  Patient recently had CT PE study for which she reports was exact same pain showed no evidence of pulmonary embolism.  Shared decision made conversation held with patient and we agreed not to pursue further imaging or lab work in this regard.  Patient will be given prescription for nausea medicine and pain medicine.  Anticipate she will need further management of her problems with her specialty and PCP team.  Patient understood plan of care as well as return precautions.  Patient discharged in good condition with slightly improved chest pain and significantly improved heart rate.   Final Clinical Impressions(s) / ED Diagnoses   Final diagnoses:  Precordial pain  Atypical chest pain  Tachycardia  Hyperglycemia  Dehydration    ED Discharge Orders         Ordered    traMADol (ULTRAM) 50 MG tablet  Every 6 hours PRN     01/16/18 2209    prochlorperazine (COMPAZINE) 10 MG tablet  2 times daily PRN     01/16/18 2209          Clinical Impression: 1. Precordial pain   2. Atypical chest pain   3. Tachycardia   4. Hyperglycemia   5. Dehydration     Disposition: Discharge  Condition: Good  I have discussed the results, Dx and Tx plan with the pt(& family if present). He/she/they expressed understanding and agree(s) with the plan. Discharge instructions discussed at great length. Strict return precautions discussed and  pt &/or family have verbalized understanding of the instructions. No further questions at time of discharge.    New Prescriptions   PROCHLORPERAZINE (COMPAZINE) 10 MG TABLET    Take 1 tablet (10 mg total) by mouth 2 (two) times daily as needed for nausea or vomiting.   TRAMADOL (ULTRAM) 50 MG TABLET    Take 1 tablet (50 mg total) by mouth every 6 (six) hours as needed.    Follow Up: [name], NP [place] [id number] [facility] [address] [id number] mc [place]       Indigo Barbian, Gwenyth Allegra, MD 01/17/18 [facility] Lewandowsky

## 2018-01-16 NOTE — ED Notes (Signed)
Patient continues to frequently request pain medication, stating pain is 10/10 while smiling/texing on her phone. Md notified that pt requests more pain meds.

## 2018-01-16 NOTE — Discharge Instructions (Signed)
Your work-up today was overall reassuring.  Your hyperglycemia was improved with just fluids.  Please maintain your hydration and use the nausea medicine prescription to help with this.  Please follow-up with your doctor in the next several days as scheduled.  We suspect your symptoms are more chronic and we did not see evidence of acute abnormality requiring admission at this time.  Please return if any symptoms change or worsen.

## 2018-01-16 NOTE — ED Notes (Signed)
Dr. tegeler aware of 579 blood sugar ; no further orders received at this time

## 2018-01-18 LAB — URINE CULTURE

## 2018-01-19 ENCOUNTER — Ambulatory Visit: Payer: Medicaid Other | Admitting: Nurse Practitioner

## 2018-01-19 MED FILL — traMADol HCL 50 MG TABS: 50 | 3 days supply | Qty: 12 | Fill #0

## 2018-01-19 MED FILL — !TRUE METRIX BLOOD GLUCOSE: 365 days supply | Qty: 1 | Fill #0

## 2018-01-24 ENCOUNTER — Encounter (HOSPITAL_COMMUNITY): Payer: Self-pay

## 2018-01-24 ENCOUNTER — Inpatient Hospital Stay (HOSPITAL_COMMUNITY)
Admission: EM | Admit: 2018-01-24 | Discharge: 2018-01-26 | DRG: 638 | Disposition: A | Discharge status: Home / Self Care | Payer: Medicaid Other | Attending: Internal Medicine | Admitting: Internal Medicine

## 2018-01-24 DIAGNOSIS — Z833 Family history of diabetes mellitus: Secondary | ICD-10-CM | POA: Diagnosis not present

## 2018-01-24 DIAGNOSIS — Z832 Family history of diseases of the blood and blood-forming organs and certain disorders involving the immune mechanism: Secondary | ICD-10-CM

## 2018-01-24 DIAGNOSIS — E1110 Type 2 diabetes mellitus with ketoacidosis without coma: Principal | ICD-10-CM

## 2018-01-24 DIAGNOSIS — Z8349 Family history of other endocrine, nutritional and metabolic diseases: Secondary | ICD-10-CM

## 2018-01-24 DIAGNOSIS — E049 Nontoxic goiter, unspecified: Secondary | ICD-10-CM | POA: Diagnosis present

## 2018-01-24 DIAGNOSIS — Z9119 Patient's noncompliance with other medical treatment and regimen: Secondary | ICD-10-CM

## 2018-01-24 DIAGNOSIS — E860 Dehydration: Secondary | ICD-10-CM | POA: Diagnosis present

## 2018-01-24 DIAGNOSIS — Z91199 Patient's noncompliance with other medical treatment and regimen due to unspecified reason: Secondary | ICD-10-CM

## 2018-01-24 DIAGNOSIS — F4329 Adjustment disorder with other symptoms: Secondary | ICD-10-CM

## 2018-01-24 DIAGNOSIS — Z886 Allergy status to analgesic agent status: Secondary | ICD-10-CM

## 2018-01-24 DIAGNOSIS — Z840 Family history of diseases of the skin and subcutaneous tissue: Secondary | ICD-10-CM

## 2018-01-24 DIAGNOSIS — Z6832 Body mass index (BMI) 32.0-32.9, adult: Secondary | ICD-10-CM | POA: Diagnosis not present

## 2018-01-24 DIAGNOSIS — I152 Hypertension secondary to endocrine disorders: Secondary | ICD-10-CM | POA: Diagnosis present

## 2018-01-24 DIAGNOSIS — L83 Acanthosis nigricans: Secondary | ICD-10-CM | POA: Diagnosis present

## 2018-01-24 DIAGNOSIS — I10 Essential (primary) hypertension: Secondary | ICD-10-CM | POA: Diagnosis not present

## 2018-01-24 DIAGNOSIS — I110 Hypertensive heart disease with heart failure: Secondary | ICD-10-CM | POA: Diagnosis present

## 2018-01-24 DIAGNOSIS — I428 Other cardiomyopathies: Secondary | ICD-10-CM | POA: Diagnosis present

## 2018-01-24 DIAGNOSIS — E876 Hypokalemia: Secondary | ICD-10-CM | POA: Diagnosis present

## 2018-01-24 DIAGNOSIS — R1084 Generalized abdominal pain: Secondary | ICD-10-CM | POA: Diagnosis present

## 2018-01-24 DIAGNOSIS — E872 Acidosis, unspecified: Secondary | ICD-10-CM | POA: Diagnosis present

## 2018-01-24 DIAGNOSIS — R109 Unspecified abdominal pain: Secondary | ICD-10-CM

## 2018-01-24 DIAGNOSIS — E1310 Other specified diabetes mellitus with ketoacidosis without coma: Secondary | ICD-10-CM

## 2018-01-24 DIAGNOSIS — Z9089 Acquired absence of other organs: Secondary | ICD-10-CM | POA: Diagnosis not present

## 2018-01-24 DIAGNOSIS — R55 Syncope and collapse: Secondary | ICD-10-CM

## 2018-01-24 DIAGNOSIS — I5022 Chronic systolic (congestive) heart failure: Secondary | ICD-10-CM | POA: Diagnosis present

## 2018-01-24 DIAGNOSIS — Z794 Long term (current) use of insulin: Secondary | ICD-10-CM | POA: Diagnosis not present

## 2018-01-24 DIAGNOSIS — Z79899 Other long term (current) drug therapy: Secondary | ICD-10-CM

## 2018-01-24 DIAGNOSIS — Z8249 Family history of ischemic heart disease and other diseases of the circulatory system: Secondary | ICD-10-CM

## 2018-01-24 DIAGNOSIS — F4320 Adjustment disorder, unspecified: Secondary | ICD-10-CM | POA: Diagnosis present

## 2018-01-24 DIAGNOSIS — Z800 Family history of malignant neoplasm of digestive organs: Secondary | ICD-10-CM

## 2018-01-24 DIAGNOSIS — M545 Low back pain: Secondary | ICD-10-CM | POA: Diagnosis present

## 2018-01-24 DIAGNOSIS — R000 Tachycardia, unspecified: Secondary | ICD-10-CM | POA: Diagnosis present

## 2018-01-24 DIAGNOSIS — E1165 Type 2 diabetes mellitus with hyperglycemia: Secondary | ICD-10-CM

## 2018-01-24 DIAGNOSIS — G8929 Other chronic pain: Secondary | ICD-10-CM | POA: Diagnosis present

## 2018-01-24 DIAGNOSIS — I951 Orthostatic hypotension: Secondary | ICD-10-CM | POA: Diagnosis present

## 2018-01-24 DIAGNOSIS — E669 Obesity, unspecified: Secondary | ICD-10-CM | POA: Diagnosis present

## 2018-01-24 DIAGNOSIS — E118 Type 2 diabetes mellitus with unspecified complications: Secondary | ICD-10-CM

## 2018-01-24 DIAGNOSIS — IMO0002 Reserved for concepts with insufficient information to code with codable children: Secondary | ICD-10-CM

## 2018-01-24 DIAGNOSIS — Z825 Family history of asthma and other chronic lower respiratory diseases: Secondary | ICD-10-CM

## 2018-01-24 DIAGNOSIS — Z791 Long term (current) use of non-steroidal anti-inflammatories (NSAID): Secondary | ICD-10-CM

## 2018-01-24 LAB — COMPREHENSIVE METABOLIC PANEL
ALBUMIN: 3.9 g/dL (ref 3.5–5.0)
ALT: 23 U/L (ref 0–44)
ANION GAP: 18 — AB (ref 5–15)
AST: 14 U/L — ABNORMAL LOW (ref 15–41)
Alkaline Phosphatase: 81 U/L (ref 38–126)
BUN: 8 mg/dL (ref 6–20)
CO2: 18 mmol/L — ABNORMAL LOW (ref 22–32)
Calcium: 9.4 mg/dL (ref 8.9–10.3)
Chloride: 96 mmol/L — ABNORMAL LOW (ref 98–111)
Creatinine, Ser: 0.68 mg/dL (ref 0.44–1.00)
GFR calc Af Amer: >60 mL/min (ref >60)
GFR calc non Af Amer: >60 mL/min (ref >60)
GLUCOSE: 563 mg/dL — AB (ref 70–99)
Potassium: 4 mmol/L (ref 3.5–5.1)
Sodium: 132 mmol/L — ABNORMAL LOW (ref 135–145)
Total Bilirubin: 0.8 mg/dL (ref 0.3–1.2)
Total Protein: 7.1 g/dL (ref 6.5–8.1)

## 2018-01-24 LAB — URINALYSIS, ROUTINE W REFLEX MICROSCOPIC
Bilirubin Urine: NEGATIVE
Glucose, UA: >=500 mg/dL — AB
Hgb urine dipstick: NEGATIVE
KETONES UR: 5 mg/dL — AB
Leukocytes, UA: NEGATIVE
Nitrite: NEGATIVE
PH: 5 (ref 5.0–8.0)
Protein, ur: NEGATIVE mg/dL
Specific Gravity, Urine: 1.036 — ABNORMAL HIGH (ref 1.005–1.030)

## 2018-01-24 LAB — TROPONIN I: Troponin I: <0.03 ng/mL (ref <0.03)

## 2018-01-24 LAB — CBC WITH DIFFERENTIAL/PLATELET
Abs Immature Granulocytes: 0.02 10*3/uL (ref 0.00–0.07)
Basophils Absolute: 0.1 10*3/uL (ref 0.0–0.1)
Basophils Relative: 1 %
Eosinophils Absolute: 0 10*3/uL (ref 0.0–0.5)
Eosinophils Relative: 1 %
HCT: 45.3 % (ref 36.0–46.0)
Hemoglobin: 14.8 g/dL (ref 12.0–15.0)
IMMATURE GRANULOCYTES: 0 %
Lymphocytes Relative: 26 %
Lymphs Abs: 1.7 10*3/uL (ref 0.7–4.0)
MCH: 28.1 pg (ref 26.0–34.0)
MCHC: 32.7 g/dL (ref 30.0–36.0)
MCV: 86.1 fL (ref 80.0–100.0)
Monocytes Absolute: 0.6 10*3/uL (ref 0.1–1.0)
Monocytes Relative: 10 %
NEUTROS ABS: 4 10*3/uL (ref 1.7–7.7)
NEUTROS PCT: 62 %
PLATELETS: 199 10*3/uL (ref 150–400)
RBC: 5.26 MIL/uL — ABNORMAL HIGH (ref 3.87–5.11)
RDW: 14.5 % (ref 11.5–15.5)
WBC: 6.4 10*3/uL (ref 4.0–10.5)
nRBC: 0 % (ref 0.0–0.2)

## 2018-01-24 LAB — BASIC METABOLIC PANEL
ANION GAP: 12 (ref 5–15)
Anion gap: 11 (ref 5–15)
BUN: 7 mg/dL (ref 6–20)
BUN: 8 mg/dL (ref 6–20)
CALCIUM: 8.5 mg/dL — AB (ref 8.9–10.3)
CO2: 25 mmol/L (ref 22–32)
CO2: 24 mmol/L (ref 22–32)
Calcium: 9.4 mg/dL (ref 8.9–10.3)
Chloride: 99 mmol/L (ref 98–111)
Chloride: 96 mmol/L — ABNORMAL LOW (ref 98–111)
Creatinine, Ser: 0.54 mg/dL (ref 0.44–1.00)
Creatinine, Ser: 0.51 mg/dL (ref 0.44–1.00)
GFR calc Af Amer: >60 mL/min (ref >60)
GFR calc Af Amer: >60 mL/min (ref >60)
GFR calc non Af Amer: >60 mL/min (ref >60)
Glucose, Bld: 358 mg/dL — ABNORMAL HIGH (ref 70–99)
Glucose, Bld: 262 mg/dL — ABNORMAL HIGH (ref 70–99)
POTASSIUM: 4.3 mmol/L (ref 3.5–5.1)
Potassium: 3.7 mmol/L (ref 3.5–5.1)
SODIUM: 131 mmol/L — AB (ref 135–145)
Sodium: 136 mmol/L (ref 135–145)

## 2018-01-24 LAB — GLUCOSE, CAPILLARY
Glucose-Capillary: 157 mg/dL — ABNORMAL HIGH (ref 70–99)
Glucose-Capillary: 194 mg/dL — ABNORMAL HIGH (ref 70–99)
Glucose-Capillary: 215 mg/dL — ABNORMAL HIGH (ref 70–99)
Glucose-Capillary: 250 mg/dL — ABNORMAL HIGH (ref 70–99)
Glucose-Capillary: 277 mg/dL — ABNORMAL HIGH (ref 70–99)
Glucose-Capillary: 280 mg/dL — ABNORMAL HIGH (ref 70–99)

## 2018-01-24 LAB — CBG MONITORING, ED
Glucose-Capillary: 487 mg/dL — ABNORMAL HIGH (ref 70–99)
Glucose-Capillary: 337 mg/dL — ABNORMAL HIGH (ref 70–99)
Glucose-Capillary: 280 mg/dL — ABNORMAL HIGH (ref 70–99)

## 2018-01-24 LAB — PREGNANCY, URINE: Preg Test, Ur: NEGATIVE

## 2018-01-24 LAB — RAPID URINE DRUG SCREEN, HOSP PERFORMED
Amphetamines: NOT DETECTED
Barbiturates: NOT DETECTED
Benzodiazepines: NOT DETECTED
COCAINE: NOT DETECTED
Opiates: NOT DETECTED
Tetrahydrocannabinol: NOT DETECTED

## 2018-01-24 LAB — HEMOGLOBIN A1C
HEMOGLOBIN A1C: 12 % — AB (ref 4.8–5.6)
Mean Plasma Glucose: 297.7 mg/dL

## 2018-01-24 LAB — MRSA PCR SCREENING: MRSA by PCR: NEGATIVE

## 2018-01-24 LAB — BRAIN NATRIURETIC PEPTIDE: B NATRIURETIC PEPTIDE 5: 4.8 pg/mL (ref 0.0–100.0)

## 2018-01-24 LAB — MAGNESIUM: Magnesium: 2.1 mg/dL (ref 1.7–2.4)

## 2018-01-24 MED ORDER — INSULIN REGULAR(HUMAN) IN NACL 100-0.9 UT/100ML-% IV SOLN
INTRAVENOUS | Status: DC
  Administered 2018-01-24: 2.8 [IU]/h via INTRAVENOUS
  Administered 2018-01-25: 5.8 [IU]/h via INTRAVENOUS
  Filled 2018-01-24 (×2): qty 100

## 2018-01-24 MED ORDER — MORPHINE SULFATE (PF) 2 MG/ML IV SOLN
1.0000 mg | INTRAVENOUS | Status: DC | PRN
  Administered 2018-01-24 – 2018-01-25 (×6): 1 mg via INTRAVENOUS
  Filled 2018-01-24 (×6): qty 1

## 2018-01-24 MED ORDER — DEXTROSE-NACL 5-0.45 % IV SOLN
INTRAVENOUS | Status: DC
  Administered 2018-01-24: 22:00:00 via INTRAVENOUS

## 2018-01-24 MED ORDER — SODIUM CHLORIDE 0.9 % IV BOLUS
500.0000 mL | Freq: Once | INTRAVENOUS | Status: AC
  Administered 2018-01-24: 500 mL via INTRAVENOUS

## 2018-01-24 MED ORDER — FLUCONAZOLE 150 MG PO TABS
150.0000 mg | ORAL_TABLET | Freq: Once | ORAL | Status: AC
  Administered 2018-01-24: 150 mg via ORAL
  Filled 2018-01-24: qty 1

## 2018-01-24 MED ORDER — QUETIAPINE FUMARATE 300 MG PO TABS
300.0000 mg | ORAL_TABLET | Freq: Every day | ORAL | Status: DC
  Administered 2018-01-24 – 2018-01-25 (×2): 300 mg via ORAL
  Filled 2018-01-24 (×2): qty 1

## 2018-01-24 MED ORDER — ACETAMINOPHEN 325 MG PO TABS
650.0000 mg | ORAL_TABLET | Freq: Once | ORAL | Status: AC
  Administered 2018-01-24: 650 mg via ORAL
  Filled 2018-01-24: qty 2

## 2018-01-24 MED ORDER — METOPROLOL TARTRATE 25 MG PO TABS
25.0000 mg | ORAL_TABLET | Freq: Two times a day (BID) | ORAL | Status: DC
  Administered 2018-01-24 – 2018-01-26 (×4): 25 mg via ORAL
  Filled 2018-01-24 (×4): qty 1

## 2018-01-24 MED ORDER — LACTATED RINGERS IV BOLUS
1000.0000 mL | Freq: Once | INTRAVENOUS | Status: AC
  Administered 2018-01-24: 1000 mL via INTRAVENOUS

## 2018-01-24 MED ORDER — LACTATED RINGERS IV SOLN
INTRAVENOUS | Status: DC
  Administered 2018-01-24: 16:00:00 via INTRAVENOUS

## 2018-01-24 MED ORDER — SODIUM CHLORIDE 0.9 % IV SOLN
INTRAVENOUS | Status: AC
  Administered 2018-01-24: 21:00:00 via INTRAVENOUS

## 2018-01-24 MED ORDER — ENOXAPARIN SODIUM 40 MG/0.4ML ~~LOC~~ SOLN
40.0000 mg | SUBCUTANEOUS | Status: DC
  Administered 2018-01-24 – 2018-01-25 (×2): 40 mg via SUBCUTANEOUS
  Filled 2018-01-24 (×2): qty 0.4

## 2018-01-24 MED ORDER — ONDANSETRON HCL 4 MG/2ML IJ SOLN
4.0000 mg | Freq: Once | INTRAMUSCULAR | Status: AC
  Administered 2018-01-24: 4 mg via INTRAVENOUS
  Filled 2018-01-24: qty 2

## 2018-01-24 MED ORDER — MORPHINE SULFATE (PF) 2 MG/ML IV SOLN
1.0000 mg | Freq: Once | INTRAVENOUS | Status: AC
  Administered 2018-01-24: 1 mg via INTRAVENOUS
  Filled 2018-01-24: qty 1

## 2018-01-24 MED ORDER — POTASSIUM CHLORIDE 10 MEQ/100ML IV SOLN
10.0000 meq | INTRAVENOUS | Status: AC
  Administered 2018-01-24 (×4): 10 meq via INTRAVENOUS
  Filled 2018-01-24 (×3): qty 100

## 2018-01-24 MED ORDER — FENTANYL CITRATE (PF) 100 MCG/2ML IJ SOLN
50.0000 ug | Freq: Once | INTRAMUSCULAR | Status: AC
  Administered 2018-01-24: 50 ug via INTRAVENOUS
  Filled 2018-01-24: qty 2

## 2018-01-24 MED ORDER — POTASSIUM CHLORIDE 10 MEQ/100ML IV SOLN
10.0000 meq | INTRAVENOUS | Status: AC
  Administered 2018-01-24 (×2): 10 meq via INTRAVENOUS
  Filled 2018-01-24 (×2): qty 100

## 2018-01-24 MED ORDER — SODIUM CHLORIDE 0.9 % IV SOLN
INTRAVENOUS | Status: DC
  Administered 2018-01-24: 19:00:00 via INTRAVENOUS

## 2018-01-24 MED ORDER — AMLODIPINE BESYLATE 5 MG PO TABS
5.0000 mg | ORAL_TABLET | Freq: Every day | ORAL | Status: DC
  Administered 2018-01-24 – 2018-01-25 (×2): 5 mg via ORAL
  Filled 2018-01-24 (×2): qty 1

## 2018-01-24 NOTE — ED Provider Notes (Addendum)
[facility] Provider Note   CSN: [id number] Arrival date & time: 01/24/18  1224     History   Chief Complaint Chief Complaint  Patient presents with  . Chest Pain  . Loss of Consciousness    HPI Nancy [name] is a 18 y.o. female.  HPI  18 year old female comes in with chief complaint of fainting. Patient has history of CHF, diabetes.  She reports that she has had persistent left-sided chest pain radiating to the back for the last month and a half.  Her pain is sharp in nature and there is no specific aggravating or relieving factors.  Over the past 24 hours patient has had 10+ episodes of syncope, which is why she decided to come to the ER.  She states that on her normal ED basis she has 1-3 syncopal episodes.  Patient's blood sugar is noted to be elevated.  She denies noncompliance with her medication and denies any recent infection, fevers, URI-like symptoms, UTI-like symptoms.  Patient denies any premature CAD or sudden unexpected deaths in the family.  She does indicate that she has had family members who have blood clots, however she had a CT PE last month which was negative for blood clots.  Patient denies any long distance travels, recent surgeries or any oral contraceptive use.  Past Medical History:  Diagnosis Date  . Acanthosis nigricans   . Anxiety   . CHF (congestive heart failure) ([place])   . Chronic lower back pain   . Depression   . Dyspepsia   . Obesity   . Ovarian cyst    pt is not aware of this hx (11/24/2017)  . Pre-diabetes   . 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 Premature baby   . Type II diabetes mellitus ([facility])    insulin dependant    Patient Active Problem List   Diagnosis Date Noted  . Orthostatic hypotension 01/24/2018  . Chronic abdominal pain 12/24/2017  . Sinus tachycardia by electrocardiogram   . Acute lower UTI   . Uncontrolled type 2 diabetes mellitus with hyperglycemia ([place])   . Chest pain  12/19/2017  . Generalized abdominal pain 08/21/2017  . Non compliance with medical treatment 01/27/2012  . Adjustment disorder 09/16/2011  . Dyspepsia   . Acanthosis nigricans   . Goiter   . Obesity 06/14/2010  . Hypertension 06/14/2010    Past Surgical History:  Procedure Laterality Date  . ABDOMINAL HERNIA REPAIR     "I was a baby"  . HERNIA REPAIR    . TONSILLECTOMY AND ADENOIDECTOMY    . WISDOM TOOTH EXTRACTION  2017     OB History   No obstetric history on file.      Home Medications    Prior to Admission medications   Medication Sig Start Date End Date Taking? Authorizing Provider  amLODipine (NORVASC) 5 MG tablet Take 1 tablet (5 mg total) by mouth daily. 12/02/17  Yes [name], NP  diclofenac sodium (VOLTAREN) 1 % GEL Apply 2 g topically 4 (four) times daily. Patient taking differently: Apply 2 g topically See admin instructions. Apply 2 grams to painful areas of chest and/or stomach four times a day 12/20/17  Yes Rizwan, Eunice Blase, MD  glucagon 1 MG injection Use for Severe Hypoglycemia . Inject 1 mg intramuscularly if unresponsive, unable to swallow, unconscious and/or has seizure Patient taking differently: Inject 1 mg into the muscle once as needed (for severe hypoglycemia- if unresponsive, unable to swallow, unconscious and/or  has seizure).  07/20/13 12/19/25 Yes Renato Shin, MD  Insulin Glargine (LANTUS) 100 UNIT/ML Solostar Pen Inject 60 Units into the skin daily. And pen needles 2/day Patient taking differently: Inject 60 Units into the skin at bedtime.  12/02/17  Yes [name], NP  insulin lispro (HUMALOG) 100 UNIT/ML injection Inject 10 Units into the skin 2 (two) times daily with a meal.    Yes [provider]  metoprolol tartrate (LOPRESSOR) 25 MG tablet Take 25 mg by mouth 2 (two) times daily.   Yes [provider]  promethazine (PHENERGAN) 25 MG tablet Take 1 tablet (25 mg total) by mouth every 6 (six) hours as needed for  nausea or vomiting. 01/09/18  Yes Carmon Sails J, PA-C  QUEtiapine (SEROQUEL) 100 MG tablet Take 300 mg by mouth at bedtime.    Yes [provider]  Blood Glucose Monitoring Suppl (TRUE METRIX METER) w/Device KIT Use as instructed. Monitor blood glucose levels twice per day 12/02/17   [name], NP  glucose blood (TRUE METRIX BLOOD GLUCOSE TEST) test strip Use as instructed 12/02/17   [name], NP  Insulin Pen Needle (B-D UF III MINI PEN NEEDLES) 31G X 5 MM MISC Use as instructed. Monitor blood glucose levels twice per day 12/02/17   [name], NP  meloxicam (MOBIC) 15 MG tablet Take 1 tablet (15 mg total) by mouth daily. Patient not taking: Reported on 01/16/2018 12/28/17   Elsie Stain, MD  prochlorperazine (COMPAZINE) 10 MG tablet Take 1 tablet (10 mg total) by mouth 2 (two) times daily as needed for nausea or vomiting. Patient not taking: Reported on 01/24/2018 01/16/18   Tegeler, Gwenyth Allegra, MD  traMADol (ULTRAM) 50 MG tablet Take 1 tablet (50 mg total) by mouth every 6 (six) hours as needed. Patient not taking: Reported on 01/24/2018 01/16/18   Tegeler, Gwenyth Allegra, MD  TRUEPLUS LANCETS 28G MISC Use as instructed. Monitor blood glucose levels twice per day 12/02/17   [name], NP    Family History Family History  Problem Relation Age of Onset  . Diabetes Mother   . Hypertension Mother   . Obesity Mother   . Asthma Mother   . Allergic rhinitis Mother   . Eczema Mother   . Diabetes Father   . Hypertension Father   . Obesity Father   . Hyperlipidemia Father   . Hypertension Paternal Aunt   . Hypertension Maternal Grandfather   . Colon cancer Maternal Grandfather   . Diabetes Paternal Grandmother   . Obesity Paternal Grandmother   . Diabetes Paternal Grandfather   . Obesity Paternal Grandfather   . Angioedema Neg Hx   . Immunodeficiency Neg Hx   . Urticaria Neg Hx   . Stomach cancer Neg Hx   . Esophageal cancer Neg Hx     Social  History Social History   Tobacco Use  . Smoking status: Never Smoker  . Smokeless tobacco: Never Used  Substance Use Topics  . Alcohol use: No    Alcohol/week: 0.0 standard drinks  . Drug use: No     Allergies   Ibuprofen   Review of Systems Review of Systems  Constitutional: Positive for activity change. Negative for fever.  Respiratory: Positive for shortness of breath.   Cardiovascular: Positive for chest pain.  Genitourinary: Negative for dysuria.  Neurological: Positive for dizziness and syncope. Negative for headaches.  All other systems reviewed and are negative.    Physical Exam Updated Vital  Signs BP (!) 136/125   Pulse (!) 126   Temp 98.4 F (36.9 C) (Oral)   Resp 19   LMP 01/13/2018   SpO2 98%   Physical Exam Vitals signs and nursing note reviewed.  Constitutional:      Appearance: She is well-developed.  HENT:     Head: Normocephalic and atraumatic.  Neck:     Musculoskeletal: Normal range of motion and neck supple.  Cardiovascular:     Rate and Rhythm: Tachycardia present.  Pulmonary:     Effort: Pulmonary effort is normal.  Abdominal:     General: Bowel sounds are normal.  Musculoskeletal:     Right lower leg: No edema.     Left lower leg: No edema.  Skin:    General: Skin is warm and dry.  Neurological:     Mental Status: She is alert and oriented to person, place, and time.      ED Treatments / Results  Labs (all labs ordered are listed, but only abnormal results are displayed) Labs Reviewed  COMPREHENSIVE METABOLIC PANEL - Abnormal; Notable for the following components:      Result Value   Sodium 132 (*)    Chloride 96 (*)    CO2 18 (*)    Glucose, Bld 563 (*)    AST 14 (*)    Anion gap 18 (*)    All other components within normal limits  CBC WITH DIFFERENTIAL/PLATELET - Abnormal; Notable for the following components:   RBC 5.26 (*)    All other components within normal limits  URINALYSIS, ROUTINE W REFLEX MICROSCOPIC -  Abnormal; Notable for the following components:   Color, Urine STRAW (*)    APPearance HAZY (*)    Specific Gravity, Urine 1.036 (*)    Glucose, UA >=500 (*)    Ketones, ur 5 (*)    Bacteria, UA RARE (*)    All other components within normal limits  CBG MONITORING, ED - Abnormal; Notable for the following components:   Glucose-Capillary 487 (*)    All other components within normal limits  CBG MONITORING, ED - Abnormal; Notable for the following components:   Glucose-Capillary 337 (*)    All other components within normal limits  PREGNANCY, URINE  RAPID URINE DRUG SCREEN, HOSP PERFORMED  MAGNESIUM  BRAIN NATRIURETIC PEPTIDE  BASIC METABOLIC PANEL  BASIC METABOLIC PANEL  CBG MONITORING, ED    EKG EKG Interpretation  Date/Time:  Sunday January 24 2018 12:32:00 EST Ventricular Rate:  124 PR Interval:  122 QRS Duration: 88 QT Interval:  308 QTC Calculation: 442 R Axis:   104 Text Interpretation:  Sinus tachycardia Right atrial enlargement Rightward axis T wave abnormality, consider inferior ischemia Abnormal ECG [id number] Confirmed by Varney Biles ([id number]) on 01/24/2018 12:55:18 PM   Radiology No results found.  Procedures .Critical Care Performed by: Varney Biles, MD Authorized by: Varney Biles, MD   Critical care provider statement:    Critical care time (minutes):  45   Critical care start time:  01/24/2018 12:41 PM   Critical care end time:  01/24/2018 2:41 PM   Critical care was necessary to treat or prevent imminent or life-threatening deterioration of the following conditions:  Circulatory failure and metabolic crisis   Critical care was time spent personally by me on the following activities:  Discussions with consultants, evaluation of patient's response to treatment, examination of patient, ordering and performing treatments and interventions, ordering and review of laboratory studies, ordering and review of radiographic  studies, pulse oximetry, re-evaluation  of patient's condition, obtaining history from patient or surrogate, review of old charts and development of treatment plan with patient or surrogate   (including critical care time)  Medications Ordered in ED Medications  insulin regular, human (MYXREDLIN) 100 units/ 100 mL infusion (has no administration in time range)  lactated ringers infusion (has no administration in time range)  potassium chloride 10 mEq in 100 mL IVPB (10 mEq Intravenous New Bag/Given 01/24/18 1503)  lactated ringers bolus 1,000 mL (1,000 mLs Intravenous New Bag/Given 01/24/18 1419)  sodium chloride 0.9 % bolus 500 mL (0 mLs Intravenous Stopped 01/24/18 1502)  acetaminophen (TYLENOL) tablet 650 mg (650 mg Oral Given 01/24/18 1450)  ondansetron (ZOFRAN) injection 4 mg (4 mg Intravenous Given 01/24/18 1451)  fentaNYL (SUBLIMAZE) injection 50 mcg (50 mcg Intravenous Given 01/24/18 1452)     Initial Impression / Assessment and Plan / ED Course  I have reviewed the triage vital signs and the nursing notes.  Pertinent labs & imaging results that were available during my care of the patient were reviewed by me and considered in my medical decision making (see chart for details).  Clinical Course as of Jan 24 1545  Sun Jan 24, 2018  1438 Blood sugar is 560, bicarb is 18 -anion gap is 18.  There is a mild DKA.  We will hydrate and start insulin drip.  Glucose(!!): 563 [AN]  1439 I discussed case with Dr. Katharina Caper, cardiology to see if there is any role for cardiology to further evaluate this patient since she has had negative CT PE, normal echo and normal coronary CT.  Dr. Katharina Caper thinks that the EP guys might be able to introduce a loop recorder.  He will let EP know about patient being admitted to medicine service.  I think since she is having syncopal episodes daily, it might be worthwhile for her to get some kind of event monitor.  She has a cardiology appointment but it is on February 23  Pulse Rate(!): 139 [AN]    Clinical  Course User Index [AN] Varney Biles, MD    Patient comes in with chief complaint of multiple syncopes.  She does not have any prodrome prior to syncope, and there is no specific aggravating or relieving factor. Patient is noted to be tachycardic.  EKG is mostly unchanged. She was admitted to the hospital last month with syncope.  At that time she had a CT PE which was negative, echocardiogram that showed EF of 45% and global dyskinesia and an outpatient coronary CT which was normal.  History is not suggestive of any underlying infection.  Patient has had significant weight loss over the past 1 year, she also had a TSH drawn which was normal.  -In the setting of patient having CHF with profound tachycardia, she is not safe for discharge.  We will admit her for telemetry monitoring.  She might have some kind of dysautonomia that needs further work-up. -Patient's orthostatics do reveal that her heart rate jumps almost 20 beats when she stood up, with her blood sugar being elevated there could be an element of dehydration.  We will hydrate her.   Final Clinical Impressions(s) / ED Diagnoses   Final diagnoses:  Syncope and collapse  Diabetic ketoacidosis without coma associated with other specified diabetes mellitus [facility])    ED Discharge Orders    None       Varney Biles, MD 01/24/18 1441    Varney Biles, MD 01/24/18 1548

## 2018-01-24 NOTE — ED Triage Notes (Signed)
Pt has been seen several times in the past month for syncopal episodes, chest pain, abd pain, vomiting, and  shortness of breath.  Symptoms.have worsened. Last vomit 30 min PTA.

## 2018-01-24 NOTE — Consult Note (Signed)
Cardiology Consultation:   Patient ID: Nancy [name] MRN: [id number]; DOB: 12/26/2000  Admit date: 01/24/2018 Date of Consult: 01/24/2018  Primary Care Provider: Claiborne Rigg, NP Primary Cardiologist:  New    Patient Profile:   Nancy [name] is a 18 y.o. female with a hx of syncope who is being seen today for the evaluation of syncope and SOB  at the request of   History of Present Illness:   Nancy [name] is a 18 yo with history of IDDM, DKA, chronic abdominal pain, (normal gastric emptying study in past) The patent says over the past couple of weeks she has been passing out all the time    Today she passed out 10 times   Denies dizziness   No palpitastions      SHe says she may have hit her head once Present to ED   Has some CP   SOme SOB   Past Medical History:  Diagnosis Date  . Acanthosis nigricans   . Anxiety   . CHF (congestive heart failure) (HCC)   . Chronic lower back pain   . Depression   . Dyspepsia   . Obesity   . Ovarian cyst    pt is not aware of this hx (11/24/2017)  . Pre-diabetes   . 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 Premature baby   . Type II diabetes mellitus (HCC)    insulin dependant    Past Surgical History:  Procedure Laterality Date  . ABDOMINAL HERNIA REPAIR     "I was a baby"  . HERNIA REPAIR    . TONSILLECTOMY AND ADENOIDECTOMY    . WISDOM TOOTH EXTRACTION  2017       Inpatient Medications: Scheduled Meds:  Continuous Infusions: . lactated ringers 1,000 mL (01/24/18 1419)  . sodium chloride     PRN Meds:   Allergies:    Allergies  Allergen Reactions  . Ibuprofen Other (See Comments)    GI MD said to not take this anymore    Social History:   Social History   Socioeconomic History  . Marital status: Single    Spouse name: Not on file  . Number of children: 0  . Years of education: Not on file  . Highest education level: Not on file  Occupational History  . Occupation: Admission  Social Needs  . Financial  resource strain: Not on file  . Food insecurity:    Worry: Not on file    Inability: Not on file  . Transportation needs:    Medical: Not on file    Non-medical: Not on file  Tobacco Use  . Smoking status: Never Smoker  . Smokeless tobacco: Never Used  Substance and Sexual Activity  . Alcohol use: No    Alcohol/week: 0.0 standard drinks  . Drug use: No  . Sexual activity: Never  Lifestyle  . Physical activity:    Days per week: Not on file    Minutes per session: Not on file  . Stress: Not on file  Relationships  . Social connections:    Talks on phone: Not on file    Gets together: Not on file    Attends religious service: Not on file    Active member of club or organization: Not on file    Attends meetings of clubs or organizations: Not on file    Relationship status: Not on file  . Intimate partner violence:    Fear of current or ex partner: Not on file  Emotionally abused: Not on file    Physically abused: Not on file    Forced sexual activity: Not on file  Other Topics Concern  . Not on file  Social History Narrative   Lives with mom and mom's girlfriend. Dad involved. 9th grade [employer].    Family History:    Family History  Problem Relation Age of Onset  . Diabetes Mother   . Hypertension Mother   . Obesity Mother   . Asthma Mother   . Allergic rhinitis Mother   . Eczema Mother   . Diabetes Father   . Hypertension Father   . Obesity Father   . Hyperlipidemia Father   . Hypertension Paternal Aunt   . Hypertension Maternal Grandfather   . Colon cancer Maternal Grandfather   . Diabetes Paternal Grandmother   . Obesity Paternal Grandmother   . Diabetes Paternal Grandfather   . Obesity Paternal Grandfather   . Angioedema Neg Hx   . Immunodeficiency Neg Hx   . Urticaria Neg Hx   . Stomach cancer Neg Hx   . Esophageal cancer Neg Hx      ROS:  Please see the history of present illness.   All other ROS reviewed and negative.      Physical Exam/Data:   Vitals:   01/24/18 1232 01/24/18 1245 01/24/18 1300 01/24/18 1345  BP: (!) 148/83 138/78 129/79 135/86  Pulse: (!) 126 (!) 126 (!) 126 (!) 139  Resp: (!) 24 18 15 19   Temp: 98.4 F (36.9 C)     TempSrc: Oral     SpO2:  99% 97% 97%   No intake or output data in the 24 hours ending 01/24/18 1424 Last 3 Weights 01/16/2018 01/09/2018 12/26/2017  Weight (lbs) 183 lb 182 lb 15.7 oz 183 lb 1.6 oz  Weight (kg) 83.008 kg 83 kg 83.054 kg     There is no height or weight on file to calculate BMI.  General:  Obese 18 yo in no acute distress HEENT: normal Lymph: no adenopathy Neck: no JVD Endocrine:  No thryomegaly Vascular: No carotid bruits; FA pulses 2+ bilaterally without bruits  Cardiac:  normal S1, S2; RRR; no murmur  Lungs:  clear to auscultation bilaterally, no wheezing, rhonchi or rales  Abd: soft, nontender, no hepatomegaly  Ext: no edema Musculoskeletal:  No deformities, BUE and BLE strength normal and equal Skin: warm and dry  Neuro:  CNs 2-12 intact, no focal abnormalities noted Psych:  Normal affect   EKG:  The EKG was personally reviewed and demonstrates:  ST 124 bpm  ST depression with  T wave inversion in II, III, AVF  (present in some previous EKGs) Telemetry:  Telemetry was personally reviewed and demonstrates:  Sinus tachycardia   110s    Relevant CV Studies:  Echo:  12/25/2017 ------------------------------------------------------------------- Study Conclusions  - Left ventricle: The cavity size was normal. Wall thickness was   normal. Systolic function was mildly reduced. The estimated   ejection fraction was in the range of 45% to 50%. Diffuse   hypokinesis. Left ventricular diastolic function parameters were   normal.  Impressions:  - Mild global reduction in LV systolic function.  ------------------------------------------------------------------- Study data:  No prior study was available for comparison.  Study status:   Routine.  Procedure:  The patient&'s pain level was 10 on a scale of 1 to 10. - new sentence - Transthoracic echocardiography. Image quality was good.  Study completion:  There were no complications.  Transthoracic echocardiography.  M-mode, complete 2D, spectral Doppler, and color Doppler.  Birthdate: Patient birthdate: Oct 12, 2000.  Age:  Patient is 18 yr old.  Sex: Gender: female.    BMI: 31.8 kg/m^2.  Blood pressure:     129/88 Patient status:  Inpatient.  Study date:  Study date: 12/25/2017. Study time: 09:45 AM.  Location:  Echo laboratory.  -------------------------------------------------------------------  ------------------------------------------------------------------- Left ventricle:  The cavity size was normal. Wall thickness was normal. Systolic function was mildly reduced. The estimated ejection fraction was in the range of 45% to 50%. Diffuse hypokinesis. Left ventricular diastolic function parameters were normal.  ------------------------------------------------------------------- Aortic valve:   Trileaflet; normal thickness leaflets. Mobility was not restricted.  Doppler:  Transvalvular velocity was within the normal range. There was no stenosis. There was no regurgitation.   ------------------------------------------------------------------- Aorta:  Aortic root: The aortic root was normal in size.  ------------------------------------------------------------------- Mitral valve:   Structurally normal valve.   Mobility was not restricted.  Doppler:  Transvalvular velocity was within the normal range. There was no evidence for stenosis. There was no regurgitation.    Peak gradient (D): 4 mm Hg.  ------------------------------------------------------------------- Left atrium:  The atrium was normal in size.  ------------------------------------------------------------------- Right ventricle:  The cavity size was normal. Systolic function  was normal.  ------------------------------------------------------------------- Pulmonic valve:    Doppler:  Transvalvular velocity was within the normal range. There was no evidence for stenosis.  ------------------------------------------------------------------- Tricuspid valve:   Structurally normal valve.    Doppler: Transvalvular velocity was within the normal range. There was trivial regurgitation.  ------------------------------------------------------------------- Right atrium:  The atrium was normal in size.  ------------------------------------------------------------------- Pericardium:  There was no pericardial effusion.  ------------------------------------------------------------------- Systemic veins: Inferior vena cava: The vessel was normal in size.  ------------------------------------------------------------------- Measurements   Left ventricle                         Value        Reference  LV ID, ED, PLAX chordal                47    mm     43 - 52  LV ID, ES, PLAX chordal                34    mm     23 - 38  LV fx shortening, PLAX chordal (L)     28    %      >=29  LV PW thickness, ED                    8     mm     ----------  IVS/LV PW ratio, ED                    1            <=1.3  LV e&', lateral                         11.1  cm/s   ----------  LV E/e&', lateral                       8.5          ----------  LV e&', medial                          9.36  cm/s   ----------  LV E/e&', medial                        10.07        ----------  LV e&', average                         10.23 cm/s   ----------  LV E/e&', average                       9.22         ----------    Ventricular septum                     Value        Reference  IVS thickness, ED                      8     mm     ----------    LVOT                                   Value        Reference  LVOT ID, S                             21    mm     ----------  LVOT area                               3.46  cm^2   ----------  LVOT peak velocity, S                  84.9  cm/s   ----------  LVOT mean velocity, S                  58.4  cm/s   ----------  LVOT VTI, S                            13.6  cm     ----------  LVOT peak gradient, S                  3     mm Hg  ----------    Aorta                                  Value        Reference  Aortic root ID, ED                     26    mm     ----------    Left atrium                            Value        Reference  LA ID, A-P, ES                         34    mm     ----------  LA ID/bsa, A-P  1.76  cm/m^2 <=2.2  LA volume, S                           35.3  ml     ----------  LA volume/bsa, S                       18.3  ml/m^2 ----------  LA volume, ES, 1-p A4C                 37.3  ml     ----------  LA volume/bsa, ES, 1-p A4C             19.3  ml/m^2 ----------  LA volume, ES, 1-p A2C                 33    ml     ----------  LA volume/bsa, ES, 1-p A2C             17.1  ml/m^2 ----------    Mitral valve                           Value        Reference  Mitral E-wave peak velocity            94.3  cm/s   ----------  Mitral A-wave peak velocity            50.1  cm/s   ----------  Mitral deceleration time               153   ms     150 - 230  Mitral peak gradient, D                4     mm Hg  ----------  Mitral E/A ratio, peak                 1.9          ----------    Right atrium                           Value        Reference  RA ID, S-I, ES, A4C                    39.4  mm     34 - 49  RA area, ES, A4C                       10.4  cm^2   8.3 - 19.5  RA volume, ES, A/L                     22    ml     ----------  RA volume/bsa, ES, A/L                 11.4  ml/m^2 ----------    Systemic veins                         Value        Reference  Estimated CVP                          3     mm Hg  ----------  Right ventricle                        Value        Reference  TAPSE                                   15.5  mm     ----------  RV s&', lateral, S                      11.5  cm/s   ----------  Legend: (L)  and  (H)  mark values outside specified reference range.  ------------------------------------------------------------------- Prepared and Electronically Authenticated by  Olga Millers 2019-12-20T14:03:07 Laboratory Data:  Chemistry Recent Labs  Lab 01/24/18 1313  NA 132*  K 4.0  CL 96*  CO2 18*  GLUCOSE 563*  BUN 8  CREATININE 0.68  CALCIUM 9.4  GFRNONAA >60  GFRAA >60  ANIONGAP 18*    Recent Labs  Lab 01/24/18 1313  PROT 7.1  ALBUMIN 3.9  AST 14*  ALT 23  ALKPHOS 81  BILITOT 0.8   Hematology Recent Labs  Lab 01/24/18 1313  WBC 6.4  RBC 5.26*  HGB 14.8  HCT 45.3  MCV 86.1  MCH 28.1  MCHC 32.7  RDW 14.5  PLT 199   Cardiac EnzymesNo results for input(s): TROPONINI in the last 168 hours. No results for input(s): TROPIPOC in the last 168 hours.  BNPNo results for input(s): BNP, PROBNP in the last 168 hours.  DDimer No results for input(s): DDIMER in the last 168 hours.  Radiology/Studies:  No results found.  Assessment and Plan:   Patient is a 18 yo with Hx IDDM poorly controlled   PResents with syncope and SOB   Found to be in DKA    UA shows urine is concentrated     She is currently getting hydrated   On exam  Orthostatics done;   Layin 143/89  P122   Sitting 149/102  P 134   Standing 143/83   P 143    COnsistent with mild POTS Otherwise exam unremarkabe   Syncpe most likely due to dehydration in setting of DKA along with some possible autonomic dysfunction in the setting of DM    I do not think it is due to an arrhythmia.    I am having a hard time believing that she actually passed out 10 times today   ? Presyncope instead given taht her current BP is high    The change in HR with standing shows though that her body is working to maintain BP  REcomm:   Hydrate     Treat glucose levels Check UA in AM Check  orthostatics in Am   2   LV dysfunction   I have reviewed echo from December   I think LVEf is probably better than stated   WOuld recomm Limited echo with Definity to confirm (once metabolics have improved) CT angio of heart showed normal coronary arteries  3   DM   Stressed with pt the importance of controlling glucose    WIll defer ti IM    For questions or updates, please contact [facility] Please consult [email or web address] for contact info under     Signed, Dietrich Pates, MD  01/24/2018 2:24 PM

## 2018-01-24 NOTE — Progress Notes (Signed)
RAYONNA KRIEL is a 18 y.o. female patient admitted from ED awake, alert - oriented  X 4 - no acute distress noted.  VSS - Blood pressure (!) 143/99, pulse 99, temperature 98.7 F (37.1 C), temperature source Oral, resp. rate 18, height 5\' 3"  (1.6 m), weight 83.3 kg, last menstrual period 01/13/2018, SpO2 100 %.    IV in place, occlusive dsg intact without redness.  Orientation to room, and floor completed with information packet given to patient/family.  Patient declined safety video at this time.  Admission INP armband ID verified with patient/family, and in place.   SR up x 2, fall assessment complete, with patient and family able to verbalize understanding of risk associated with falls, and verbalized understanding to call nsg before up out of bed.  Call light within reach, patient able to voice, and demonstrate understanding.  Skin, clean-dry- intact without evidence of bruising, or skin tears.   No evidence of skin break down noted on exam.     Will cont to eval and treat per MD orders.  Eligah East, RN 01/24/2018 6:40 PM

## 2018-01-24 NOTE — H&P (Addendum)
History and Physical  [name] TXH:[id number] DOB: Sep 20, 2000 DOA: 01/24/2018  Referring physician: Varney Biles, MD  PCP: [name], NP  Outpatient Specialists: None Patient coming from: Home & is able to ambulate   Chief Complaint: Recurrent syncope  HPI: Nancy [name] is a 18 y.o. female with medical history significant for 2 diabetes mellitus diagnosed at age 47 with chronic noncompliance.  Congestive heart failure with low ejection fraction of 44% global hypokinesia hypertension chronic GI issues was last seen admitted to this hospital December night 2019 for abdominal pain nausea and vomiting.  Patient comes in today with complaint of recurrent passing out, that she has been passing out about 3-4 or 5 times a day patient stated that it happens anytime she could be sitting down or walking or standing patient stated that she has been compliant with taking her Lantus regularly but not her Humalog because she is supposed to take it with meals and sometimes she does not eat so she does not take it.  She also complains of chest pain for the past 1 month pain is in the epigastric area and the abdomen and left flank  ED Course: Patient was given IV fluids boluses.  Orthostatic was done.  She was also found to have hypokalemia and was given IV potassium.  She was found to be in DKA and started on insulin drip.  ED doc consulted with Dr. Meda Coffee who recommended admission and they will consult with electrophysiology in a.m.  Review of Systems: Patient seen  . Pt complains of terminal pain chest pain Pt denies any cough fever or chills.  Review of systems are otherwise negative   Past Medical History:  Diagnosis Date  . Acanthosis nigricans   . Anxiety   . CHF (congestive heart failure) ([place])   . Chronic lower back pain   . Depression   . Dyspepsia   . Obesity   . Ovarian cyst    pt is not aware of this hx (11/24/2017)  . Pre-diabetes   . 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  Premature baby   . Type II diabetes mellitus (HCC)    insulin dependant   Past Surgical History:  Procedure Laterality Date  . ABDOMINAL HERNIA REPAIR     "I was a baby"  . HERNIA REPAIR    . TONSILLECTOMY AND ADENOIDECTOMY    . WISDOM TOOTH EXTRACTION  2017    Social History:  reports that she has never smoked. She has never used smokeless tobacco. She reports that she does not drink alcohol or use drugs.   Allergies  Allergen Reactions  . Ibuprofen Other (See Comments)    GI MD said to not take this anymore    Family History  Problem Relation Age of Onset  . Diabetes Mother   . Hypertension Mother   . Obesity Mother   . Asthma Mother   . Allergic rhinitis Mother   . Eczema Mother   . Diabetes Father   . Hypertension Father   . Obesity Father   . Hyperlipidemia Father   . Hypertension Paternal Aunt   . Hypertension Maternal Grandfather   . Colon cancer Maternal Grandfather   . Diabetes Paternal Grandmother   . Obesity Paternal Grandmother   . Diabetes Paternal Grandfather   . Obesity Paternal Grandfather   . Angioedema Neg Hx   . Immunodeficiency Neg Hx   . Urticaria Neg Hx   . Stomach cancer Neg Hx   . Esophageal  cancer Neg Hx       Prior to Admission medications   Medication Sig Start Date End Date Taking? Authorizing Provider  amLODipine (NORVASC) 5 MG tablet Take 1 tablet (5 mg total) by mouth daily. 12/02/17  Yes [name], NP  diclofenac sodium (VOLTAREN) 1 % GEL Apply 2 g topically 4 (four) times daily. Patient taking differently: Apply 2 g topically See admin instructions. Apply 2 grams to painful areas of chest and/or stomach four times a day 12/20/17  Yes Rizwan, Eunice Blase, MD  glucagon 1 MG injection Use for Severe Hypoglycemia . Inject 1 mg intramuscularly if unresponsive, unable to swallow, unconscious and/or has seizure Patient taking differently: Inject 1 mg into the muscle once as needed (for severe hypoglycemia- if unresponsive, unable to  swallow, unconscious and/or has seizure).  07/20/13 12/19/25 Yes Renato Shin, MD  Insulin Glargine (LANTUS) 100 UNIT/ML Solostar Pen Inject 60 Units into the skin daily. And pen needles 2/day Patient taking differently: Inject 60 Units into the skin at bedtime.  12/02/17  Yes [name], NP  insulin lispro (HUMALOG) 100 UNIT/ML injection Inject 10 Units into the skin 2 (two) times daily with a meal.    Yes [provider]  metoprolol tartrate (LOPRESSOR) 25 MG tablet Take 25 mg by mouth 2 (two) times daily.   Yes [provider]  promethazine (PHENERGAN) 25 MG tablet Take 1 tablet (25 mg total) by mouth every 6 (six) hours as needed for nausea or vomiting. 01/09/18  Yes Carmon Sails J, PA-C  QUEtiapine (SEROQUEL) 100 MG tablet Take 300 mg by mouth at bedtime.    Yes [provider]  Blood Glucose Monitoring Suppl (TRUE METRIX METER) w/Device KIT Use as instructed. Monitor blood glucose levels twice per day 12/02/17   [name], NP  glucose blood (TRUE METRIX BLOOD GLUCOSE TEST) test strip Use as instructed 12/02/17   [name], NP  Insulin Pen Needle (B-D UF III MINI PEN NEEDLES) 31G X 5 MM MISC Use as instructed. Monitor blood glucose levels twice per day 12/02/17   [name], NP  meloxicam (MOBIC) 15 MG tablet Take 1 tablet (15 mg total) by mouth daily. Patient not taking: Reported on 01/16/2018 12/28/17   Elsie Stain, MD  prochlorperazine (COMPAZINE) 10 MG tablet Take 1 tablet (10 mg total) by mouth 2 (two) times daily as needed for nausea or vomiting. Patient not taking: Reported on 01/24/2018 01/16/18   Tegeler, Gwenyth Allegra, MD  traMADol (ULTRAM) 50 MG tablet Take 1 tablet (50 mg total) by mouth every 6 (six) hours as needed. Patient not taking: Reported on 01/24/2018 01/16/18   Tegeler, Gwenyth Allegra, MD  TRUEPLUS LANCETS 28G MISC Use as instructed. Monitor blood glucose levels twice per day 12/02/17   [name], NP     Physical Exam: BP 126/88   Pulse (!) 108   Temp 98.4 F (36.9 C) (Oral)   Resp 17   LMP 01/13/2018   SpO2 100%   Exam:  . General: 18 y.o. year-old female well developed well nourished in no acute distress.  Alert and oriented x3. . Cardiovascular: Regular rate and rhythm with no rubs or gallops.  No thyromegaly or JVD noted.   Marland Kitchen Respiratory: Clear to auscultation with no wheezes or rales. Good inspiratory effort. . Abdomen: Soft nontender nondistended with normal bowel sounds x4 quadrants. . Musculoskeletal: No lower extremity edema. 2/4 pulses in all 4 extremities. . Skin: No ulcerative lesions noted  or rashes, . Psychiatry: Mood is appropriate for condition and setting           Labs on Admission:  Basic Metabolic Panel: Recent Labs  Lab 01/24/18 1313 01/24/18 1600  NA 132* 136  K 4.0 4.3  CL 96* 99  CO2 18* 25  GLUCOSE 563* 358*  BUN 8 7  CREATININE 0.68 0.54  CALCIUM 9.4 9.4  MG 2.1  --    Liver Function Tests: Recent Labs  Lab 01/24/18 1313  AST 14*  ALT 23  ALKPHOS 81  BILITOT 0.8  PROT 7.1  ALBUMIN 3.9   No results for input(s): LIPASE, AMYLASE in the last 168 hours. No results for input(s): AMMONIA in the last 168 hours. CBC: Recent Labs  Lab 01/24/18 1313  WBC 6.4  NEUTROABS 4.0  HGB 14.8  HCT 45.3  MCV 86.1  PLT 199   Cardiac Enzymes: No results for input(s): CKTOTAL, CKMB, CKMBINDEX, TROPONINI in the last 168 hours.  BNP (last 3 results) Recent Labs    01/16/18 1830 01/24/18 1313  BNP 5.6 4.8    ProBNP (last 3 results) No results for input(s): PROBNP in the last 8760 hours.  CBG: Recent Labs  Lab 01/24/18 1311 01/24/18 1537  GLUCAP 487* 337*    Radiological Exams on Admission: No results found.  EKG: Independently reviewed.    Assessment/Plan Present on Admission: . Obesity . Hypertension . Acanthosis nigricans . Goiter . Adjustment disorder . Sinus tachycardia by electrocardiogram . Uncontrolled type 2  diabetes mellitus with hyperglycemia ([place]) . (Resolved) Syncope . (Resolved) Lactic acidosis . (Resolved) DKA (diabetic ketoacidoses) ([place]) . Orthostatic hypotension . DKA (diabetic ketoacidosis) ([place])  Active Problems:   Obesity   Hypertension   Acanthosis nigricans   Goiter   Adjustment disorder   Non compliance with medical treatment   Sinus tachycardia by electrocardiogram   Uncontrolled type 2 diabetes mellitus with hyperglycemia (HCC)   Orthostatic hypotension   DKA (diabetic ketoacidosis) ([place])   1.  DKA.  Patient has elevated blood sugars in the 500s with no abdominal pain but she does have an anion gap.  DKA protocol has been started which includes aggressive IV rehydration and V insulin and she will be admitted to stepdown units  2.  Recurrent syncope unknown cause with orthostatic hypotension most likely contributing to her recurrent symptom.  Cardiology Dr. Katharina Caper has Been consulted and she will consult with electrophysiologist  3.  Tachycardia most likely from DKA patient is being hydrated with IV fluids boluses  4.  CHF with reduced ejection fraction of 44% with global hypokinesia patient has a cardiology appointment for follow-up she is not in failure at this time  5.  Dehydration most likely from DKA IV hydration is being done accordingly  6.  Chest pain EKG is abnormal but troponin is normal.  We will continue to monitor  7.  Hypokalemia: Patient is being repleted with IV potassium  Severity of Illness: The appropriate patient status for this patient is INPATIENT. Inpatient status is judged to be reasonable and necessary in order to provide the required intensity of service to ensure the patient's safety. The patient's presenting symptoms, physical exam findings, and initial radiographic and laboratory data in the context of their chronic comorbidities is felt to place them at high risk for further clinical deterioration. Furthermore, it is not anticipated that the  patient will be medically stable for discharge from the hospital within 2 midnights of admission. The following factors support the patient  status of inpatient.   " The patient's presenting symptoms include recurrent syncope abdominal pain chest pain. " The worrisome physical exam findings include chest pain DKA. " The initial radiographic and laboratory data are worrisome because of abnormal EKG. " The chronic co-morbidities include noncompliance.   * I certify that at the point of admission it is my clinical judgment that the patient will require inpatient hospital care spanning beyond 2 midnights from the point of admission due to high intensity of service, high risk for further deterioration and high frequency of surveillance required.*    DVT prophylaxis: Lovenox  Code Status: Full  Family Communication: None at bedside  Disposition Plan: When stable  Consults called: cardiology  Dr. Katharina Caper  Admission status: Patient    Cristal Deer MD [facility] Pager [phone]  If 7PM-7AM, please contact night-coverage [email or web address] Password [facility]  01/24/2018, 4:48 PM

## 2018-01-25 ENCOUNTER — Inpatient Hospital Stay (HOSPITAL_COMMUNITY): Payer: Medicaid Other

## 2018-01-25 DIAGNOSIS — R55 Syncope and collapse: Secondary | ICD-10-CM

## 2018-01-25 DIAGNOSIS — E1110 Type 2 diabetes mellitus with ketoacidosis without coma: Principal | ICD-10-CM

## 2018-01-25 LAB — GLUCOSE, CAPILLARY
GLUCOSE-CAPILLARY: 334 mg/dL — AB (ref 70–99)
Glucose-Capillary: 123 mg/dL — ABNORMAL HIGH (ref 70–99)
Glucose-Capillary: 134 mg/dL — ABNORMAL HIGH (ref 70–99)
Glucose-Capillary: 143 mg/dL — ABNORMAL HIGH (ref 70–99)
Glucose-Capillary: 144 mg/dL — ABNORMAL HIGH (ref 70–99)
Glucose-Capillary: 146 mg/dL — ABNORMAL HIGH (ref 70–99)
Glucose-Capillary: 157 mg/dL — ABNORMAL HIGH (ref 70–99)
Glucose-Capillary: 169 mg/dL — ABNORMAL HIGH (ref 70–99)
Glucose-Capillary: 193 mg/dL — ABNORMAL HIGH (ref 70–99)
Glucose-Capillary: 213 mg/dL — ABNORMAL HIGH (ref 70–99)
Glucose-Capillary: 215 mg/dL — ABNORMAL HIGH (ref 70–99)
Glucose-Capillary: 226 mg/dL — ABNORMAL HIGH (ref 70–99)
Glucose-Capillary: 234 mg/dL — ABNORMAL HIGH (ref 70–99)
Glucose-Capillary: 85 mg/dL (ref 70–99)

## 2018-01-25 LAB — BASIC METABOLIC PANEL
Anion gap: 8 (ref 5–15)
BUN: 11 mg/dL (ref 6–20)
CO2: 23 mmol/L (ref 22–32)
Calcium: 8 mg/dL — ABNORMAL LOW (ref 8.9–10.3)
Chloride: 107 mmol/L (ref 98–111)
Creatinine, Ser: 0.98 mg/dL (ref 0.44–1.00)
GFR calc Af Amer: >60 mL/min (ref >60)
GFR calc non Af Amer: >60 mL/min (ref >60)
Glucose, Bld: 269 mg/dL — ABNORMAL HIGH (ref 70–99)
Potassium: 3.5 mmol/L (ref 3.5–5.1)
Sodium: 138 mmol/L (ref 135–145)

## 2018-01-25 LAB — ECHOCARDIOGRAM LIMITED
Height: 63 in
Weight: 2938.29 oz

## 2018-01-25 LAB — TROPONIN I
Troponin I: <0.03 ng/mL (ref <0.03)
Troponin I: <0.03 ng/mL (ref <0.03)

## 2018-01-25 MED ORDER — INSULIN ASPART 100 UNIT/ML ~~LOC~~ SOLN
4.0000 [IU] | Freq: Three times a day (TID) | SUBCUTANEOUS | Status: DC
  Administered 2018-01-25: 4 [IU] via SUBCUTANEOUS

## 2018-01-25 MED ORDER — INSULIN GLARGINE 100 UNIT/ML ~~LOC~~ SOLN
42.0000 [IU] | SUBCUTANEOUS | Status: DC
  Administered 2018-01-25: 42 [IU] via SUBCUTANEOUS
  Filled 2018-01-25 (×2): qty 0.42

## 2018-01-25 MED ORDER — POTASSIUM CHLORIDE 10 MEQ/100ML IV SOLN
INTRAVENOUS | Status: AC
  Administered 2018-01-25: 10 meq
  Filled 2018-01-25: qty 100

## 2018-01-25 MED ORDER — INSULIN ASPART 100 UNIT/ML ~~LOC~~ SOLN: 0.0000 [IU] | Freq: Every day | SUBCUTANEOUS | Status: DC

## 2018-01-25 MED ORDER — INSULIN ASPART 100 UNIT/ML ~~LOC~~ SOLN
0.0000 [IU] | Freq: Three times a day (TID) | SUBCUTANEOUS | Status: DC
  Administered 2018-01-25: 15 [IU] via SUBCUTANEOUS
  Administered 2018-01-26: 11 [IU] via SUBCUTANEOUS

## 2018-01-25 MED ORDER — MORPHINE SULFATE (PF) 2 MG/ML IV SOLN
2.0000 mg | INTRAVENOUS | Status: DC | PRN
  Administered 2018-01-25 (×3): 2 mg via INTRAVENOUS
  Administered 2018-01-26: 4 mg via INTRAVENOUS
  Filled 2018-01-25 (×3): qty 1
  Filled 2018-01-25: qty 2

## 2018-01-25 MED ORDER — ALUM & MAG HYDROXIDE-SIMETH 200-200-20 MG/5ML PO SUSP: 30.0000 mL | ORAL | Status: DC | PRN

## 2018-01-25 MED ORDER — PERFLUTREN LIPID MICROSPHERE
2.0000 mL | INTRAVENOUS | Status: AC | PRN
  Administered 2018-01-25: 2 mL via INTRAVENOUS
  Filled 2018-01-25: qty 2

## 2018-01-25 MED ORDER — PANTOPRAZOLE SODIUM 40 MG PO TBEC
40.0000 mg | DELAYED_RELEASE_TABLET | Freq: Every day | ORAL | Status: DC
  Administered 2018-01-25 – 2018-01-26 (×2): 40 mg via ORAL
  Filled 2018-01-25 (×2): qty 1

## 2018-01-25 NOTE — Progress Notes (Addendum)
Inpatient Diabetes Program Recommendations  AACE/ADA: New Consensus Statement on Inpatient Glycemic Control   Target Ranges:  Prepandial:   less than 140 mg/dL      Peak postprandial:   less than 180 mg/dL (1-2 hours)      Critically ill patients:  140 - 180 mg/dL   Results for DESMARIE, NANDIN (MRN [id number]) as of 01/25/2018 08:48  Ref. Range 01/25/2018 00:28 01/25/2018 01:38 01/25/2018 02:31 01/25/2018 03:27 01/25/2018 04:31 01/25/2018 05:31 01/25/2018 06:34 01/25/2018 07:55  Glucose-Capillary Latest Ref Range: 70 - 99 mg/dL 903 (H) 833 (H) 383 (H) 146 (H) 144 (H) 157 (H) 143 (H) 85   Results for TABYTHA, ROHS (MRN [id number]) as of 01/25/2018 08:48  Ref. Range 01/24/2018 13:13  Glucose Latest Ref Range: 70 - 99 mg/dL 004 [facility])  Results for LACOSTA, AX (MRN [id number]) as of 01/25/2018 08:48  Ref. Range 11/24/2017 03:23 12/16/2017 08:23 12/19/2017 03:50 01/24/2018 18:19  Hemoglobin A1C Latest Ref Range: 4.8 - 5.6 % 12.4 (H) 10.6 (H) 10.8 (H) 12.0 (H)    Review of Glycemic Control  Diabetes history: DM2 (managed as DM1) Outpatient Diabetes medications: Lantus 60 units QHS, Humalog 10 units BID Current orders for Inpatient glycemic control: IV insulin drip per DKA  Inpatient Diabetes Program Recommendations:  At transition from IV to SQ Insulin - Basal: At time of transition from IV to SQ insulin, please consider ordering Lantus 42 units Q24H (based on 83 kg x 0.5 units), CBGs with Novolog 0-9 units Q4H, and if diet ordered then please order Novolog 3 units TID with meals for meal coverage if patient eats at least 50% of meals. HgbA1C: A1C 12% on 01/24/18 indicating an average glucose of 298 mg/dl. Patient was last seen by Diabetes Coordinator on 12/25/17 during last hospital admission. Patient has been seen by Inpatient Diabetes team multiple times over the past 6 months.  Patient would benefit from having a correction scale to use with the Humalog (along with meal coverage) AC&HS. Please  consider adding a correction scale for the Humalog at time of discharge.   NOTE: Patient does not have insurance but is followed by [facility] and she was last seen on 12/28/17. Patient admitted with DKA with initial glucose 563 mg/dl and patient was started on IV insulin drip. Diabetes Coordinator will continue to follow.  Addendum 01/25/18@12 :30-Spoke with patient about diabetes and home regimen for diabetes control. Patient reports that she is followed by [facility]) for diabetes management and currently she takes Lantus 60 units QHS and Humalog 10 units BID with meals as an outpatient for diabetes control.  Patient reports that she is taking Lantus insulin consistently as prescribed but she admits that she does not take the Humalog consistently. Patient states that she is not able to eat at times due to nausea and vomiting after trying to eat. Patient states she is working with GI doctors to resolve the issue but she reports that she has been told they ruled out gastroparesis.  Patient reports that she is able to get insulins and other medications from the clinic and she is in the process of trying to sign up for Medicaid.  Patient states thats he checks glucose 2-3 times per day and that it is usually 250 mg/dl or higher.  Discussed A1C results (12% on 01/24/18) and explained that current A1C indicates an average glucose of 298 mg/dl over the past 2-3 months. Discussed glucose and A1C goals. Discussed  importance of checking CBGs and maintaining good CBG control to prevent long-term and short-term complications. Explained how hyperglycemia leads to damage within blood vessels which lead to the common complications seen with uncontrolled diabetes. Stressed to the patient the importance of improving glycemic control to prevent further complications from uncontrolled diabetes especially given that she is only 18 years old. Discussed impact of nutrition,  exercise, stress, sickness, and medications on diabetes control. Encouraged patient to check her glucose 4 times per day (before meals and at bedtime) and to keep a log book of glucose readings and insulin taken which she will need to take to doctor appointments. Explained how the doctor she follows up with can use the log book to continue to make insulin adjustments if needed. Encouraged patient to ask if clinic has any access to continuous glucose sensors which would provide more data for her and for the doctor to make adjustments with DM medications. Explained that she likely would benefit from having a correction scale to use with the Humalog (along with meal coverage) so that she can take the Humalog to get her glucose back down to target glucose. Patient has used correction scale insulin in the past.  Patient verbalized understanding of information discussed and she states that she has no further questions at this time related to diabetes.  Thanks, Orlando Penner, RN, MSN, CDE Diabetes Coordinator Inpatient Diabetes Program [phone] (Team Pager from 8am to 5pm)

## 2018-01-25 NOTE — Progress Notes (Signed)
  Echocardiogram 2D Echocardiogram has been performed.  Callum Wolf G Derrek Puff 01/25/2018, 1:04 PM

## 2018-01-25 NOTE — Progress Notes (Addendum)
This nurse suspected that patient was eating due to visiting of a family member and empty grocery bag on the side of patient's bedside table and blood sugar jumped from 157 to 194 to 266 on insulin gtt. This nurse explained to patient the consequences of eating while being on a insulin gtt.  Pt denies eating, but CNA Lurena Joiner took patient to the bathroom and she had crumbs all in her bed.

## 2018-01-25 NOTE — Evaluation (Signed)
Physical Therapy Evaluation Patient Details Name: Nancy [name] MRN: [id number] DOB: 09/15/2000 Today's Date: 01/25/2018   History of Present Illness  Pt is a 18yo female admitted with complaints of frequent loss of consciousness (3-4 times/day) when sitting or standing, suspected due to orthostatic hypotension. She has DM and CHF, and was determined to have DKA upon admission.  Clinical Impression   Pt received in bed, willing to work with therapy. She reports pain 9/10 in her chest and abdomen, though her behaviors did not reflect this high of a pain rating. She reports she will be going home with her mother, who is available for 24 hour care, to their apartment which has no stairs to enter and no stairs inside. Pt was able to perform bed mobility Mod(I) and sit to/from stand transfers Mod(I). Evaluated pt's dizziness upon standing before beginning ambulation; she reported no dizziness. Pt able to ambulate with no AD approximately 200 ft in hallway with min guard for safety due to history of loss of consciousness. She maintained HR 102-110 during ambulation. Assessed pt for dizziness and SOB throughout ambulation, with no abnormal increase in HR or SOB. Pt returned to chair in room where she was left with needs met and call bell in reach. Due to patient's ability to perform bed mobility and transfers Mod(I) and ambulation with min guard for safety only, no further recommendations for PT follow up or equipment.     Follow Up Recommendations No PT follow up    Equipment Recommendations  None recommended by PT    Recommendations for Other Services       Precautions / Restrictions Precautions Precautions: Fall Restrictions Weight Bearing Restrictions: No      Mobility  Bed Mobility Overal bed mobility: Modified Independent             General bed mobility comments: Pt able to move from supine to sit without assistance or verbal cuing; increased time  Transfers Overall  transfer level: Modified independent               General transfer comment: Pt able to stand from sitting on EOB without assistance; increased time  Ambulation/Gait Ambulation/Gait assistance: Min guard Gait Distance (Feet): 200 Feet Assistive device: None Gait Pattern/deviations: Step-through pattern;Decreased stride length     General Gait Details: decreased  Stairs            Wheelchair Mobility    Modified Rankin (Stroke Patients Only)       Balance Overall balance assessment: Modified Independent(Pt able to balance in standing without UE support, and balance during ambulation wtihout UE support while walking around hallway obstacles)                                           Pertinent Vitals/Pain Pain Assessment: 0-10 Pain Score: 9  Pain Location: chest, abdomen Pain Descriptors / Indicators: Discomfort;Constant Pain Intervention(s): Monitored during session;Limited activity within patient's tolerance    Home Living Family/patient expects to be discharged to:: Private residence Living Arrangements: Parent Available Help at Discharge: Family;Available 24 hours/day Type of Home: Apartment Home Access: Level entry     Home Layout: One level Home Equipment: None      Prior Function Level of Independence: Independent               Hand Dominance        Extremity/Trunk Assessment  Upper Extremity Assessment Upper Extremity Assessment: Overall WFL for tasks assessed    Lower Extremity Assessment Lower Extremity Assessment: Overall WFL for tasks assessed    Cervical / Trunk Assessment Cervical / Trunk Assessment: Normal  Communication   Communication: No difficulties  Cognition Arousal/Alertness: Awake/alert Behavior During Therapy: WFL for tasks assessed/performed Overall Cognitive Status: Within Functional Limits for tasks assessed                                        General Comments       Exercises     Assessment/Plan    PT Assessment Patent does not need any further PT services  PT Problem List         PT Treatment Interventions      PT Goals (Current goals can be found in the Care Plan section)  Acute Rehab PT Goals Patient Stated Goal: go home PT Goal Formulation: With patient Time For Goal Achievement: 02/08/18 Potential to Achieve Goals: Good    Frequency     Barriers to discharge        Co-evaluation               AM-PAC PT "6 Clicks" Mobility  Outcome Measure Help needed turning from your back to your side while in a flat bed without using bedrails?: None Help needed moving from lying on your back to sitting on the side of a flat bed without using bedrails?: None Help needed moving to and from a bed to a chair (including a wheelchair)?: None Help needed standing up from a chair using your arms (e.g., wheelchair or bedside chair)?: None Help needed to walk in hospital room?: A Little Help needed climbing 3-5 steps with a railing? : A Little 6 Click Score: 22    End of Session Equipment Utilized During Treatment: Gait belt Activity Tolerance: Patient tolerated treatment well Patient left: in chair;with call bell/phone within reach   PT Visit Diagnosis: Pain;History of falling (Z91.81) Pain - part of body: (chest, abdomen)    Time: 1350-1410 PT Time Calculation (min) (ACUTE ONLY): 20 min   Charges:              Ronnell Guadal[place], SPT

## 2018-01-25 NOTE — Progress Notes (Signed)
Patient ID: Nancy [name], female   DOB: 2000/04/07, 18 y.o.   MRN: [id number]  PROGRESS NOTE    Nancy [name]  XVQ:[id number] DOB: [birth year] DOA: 01/24/2018 PCP: Claiborne Rigg, NP   Brief Narrative:  18 year old female with history of diabetes mellitus, chronic noncompliance, congestive heart failure with ejection fraction of 44%, hypertension, chronic abdominal pain presented with recurrent syncope.  She was found to have DKA and started on insulin drip and IV fluids.  Cardiology was consulted.   Assessment & Plan:   Active Problems:   Obesity   Hypertension   Acanthosis nigricans   Goiter   Adjustment disorder   Non compliance with medical treatment   Sinus tachycardia by electrocardiogram   Uncontrolled type 2 diabetes mellitus with hyperglycemia (HCC)   Orthostatic hypotension   DKA (diabetic ketoacidosis) (HCC)   DKA in a patient with history of diabetes mellitus type 2 and noncompliance -Started on insulin drip.  Anion gap is closed.  Will transition to long-acting insulin and give 42 units of Lantus daily.  Start CBGs with sliding scale coverage.  Add NovoLog with meals.  Recurrent syncope -Unknown cause.  Cardiology consulted.  Orthostatic vitals.  PT eval.  Might need outpatient event monitor.  Question of cardiomyopathy with EF of 45 to 50% in 12/2017 -strict input and output.  Daily weights.  Follow cardiology recommendations.  Continue metoprolol  Chronic chest and abdominal pain -Patient has had extensive work-up with GI as an outpatient.  Will get right upper quadrant ultrasound to rule out was source.  Outpatient follow-up with GI.  We will add Protonix and antacids.  Unclear if the chest pain is ischemic in nature.  Follow further cardiology recommendations.  DVT prophylaxis: Lovenox Code Status: Full Family Communication: None at bedside Disposition Plan: Home probably tomorrow if medically improved and cleared by cardiology  Consultants:  Cardiology  Procedures: None  Antimicrobials: None   Subjective: Patient seen and examined at bedside.  Complains of chest and abdominal pain but slightly better.  Feels hungry.  No overnight fever or vomiting.  Objective: Vitals:   01/24/18 1600 01/24/18 1828 01/24/18 2017 01/24/18 2230  BP: (!) 136/91 (!) 143/99  110/72  Pulse: (!) 106 99  100  Resp: 18 18  17   Temp:  98.7 F (37.1 C) 99.1 F (37.3 C) 98.1 F (36.7 C)  TempSrc:  Oral Oral Oral  SpO2: 100% 100%  99%  Weight:  83.3 kg    Height:  5\' 3"  (1.6 m)      Intake/Output Summary (Last 24 hours) at 01/25/2018 1032 Last data filed at 01/25/2018 0331 Gross per 24 hour  Intake 3362.99 ml  Output -  Net 3362.99 ml   Filed Weights   01/24/18 1828  Weight: 83.3 kg    Examination:  General exam: Appears calm and comfortable.  No distress Respiratory system: Bilateral decreased breath sounds at bases Cardiovascular system: S1 & S2 heard, Rate controlled Gastrointestinal system: Abdomen is nondistended, soft and mildly tender in the epigastric region. Normal bowel sounds heard. Extremities: No cyanosis, clubbing, edema     Data Reviewed: I have personally reviewed following labs and imaging studies  CBC: Recent Labs  Lab 01/24/18 1313  WBC 6.4  NEUTROABS 4.0  HGB 14.8  HCT 45.3  MCV 86.1  PLT 199   Basic Metabolic Panel: Recent Labs  Lab 01/24/18 1313 01/24/18 1600 01/24/18 2007 01/25/18 0114  NA 132* 136 131* 138  K 4.0 4.3 3.7 3.5  CL 96* 99 96* 107  CO2 18* 25 24 23   GLUCOSE 563* 358* 262* 269*  BUN 8 7 8 11   CREATININE 0.68 0.54 0.51 0.98  CALCIUM 9.4 9.4 8.5* 8.0*  MG 2.1  --   --   --    GFR: Estimated Creatinine Clearance: 94.5 mL/min (by C-G formula based on SCr of 0.98 mg/dL). Liver Function Tests: Recent Labs  Lab 01/24/18 1313  AST 14*  ALT 23  ALKPHOS 81  BILITOT 0.8  PROT 7.1  ALBUMIN 3.9   No results for input(s): LIPASE, AMYLASE in the last 168 hours. No results  for input(s): AMMONIA in the last 168 hours. Coagulation Profile: No results for input(s): INR, PROTIME in the last 168 hours. Cardiac Enzymes: Recent Labs  Lab 01/24/18 2007 01/25/18 0114 01/25/18 0520  TROPONINI <0.03 <0.03 <0.03   BNP (last 3 results) No results for input(s): PROBNP in the last 8760 hours. HbA1C: Recent Labs    01/24/18 1819  HGBA1C 12.0*   CBG: Recent Labs  Lab 01/25/18 0531 01/25/18 0634 01/25/18 0755 01/25/18 0916 01/25/18 1028  GLUCAP 157* 143* 85 134* 123*   Lipid Profile: No results for input(s): CHOL, HDL, LDLCALC, TRIG, CHOLHDL, LDLDIRECT in the last 72 hours. Thyroid Function Tests: No results for input(s): TSH, T4TOTAL, FREET4, T3FREE, THYROIDAB in the last 72 hours. Anemia Panel: No results for input(s): VITAMINB12, FOLATE, FERRITIN, TIBC, IRON, RETICCTPCT in the last 72 hours. Sepsis Labs: No results for input(s): PROCALCITON, LATICACIDVEN in the last 168 hours.  Recent Results (from the past 240 hour(s))  Urine culture     Status: Abnormal   Collection Time: 01/16/18  5:09 PM  Result Value Ref Range Status   Specimen Description URINE, RANDOM  Final   Special Requests   Final    NONE Performed at [facility], [address], [place], [place] [address]    Culture MULTIPLE SPECIES PRESENT, SUGGEST RECOLLECTION (A)  Final   Report Status 01/18/2018 FINAL  Final  MRSA PCR Screening     Status: None   Collection Time: 01/24/18  6:40 PM  Result Value Ref Range Status   MRSA by PCR NEGATIVE NEGATIVE Final    Comment:        The GeneXpert MRSA Assay (FDA approved for NASAL specimens only), is one component of a comprehensive MRSA colonization surveillance program. It is not intended to diagnose MRSA infection nor to guide or monitor treatment for MRSA infections. Performed at [facility], [address], [place], [place] [address]          Radiology Studies: No results found.      Scheduled Meds: .  amLODipine  5 mg Oral Daily  . enoxaparin (LOVENOX) injection  40 mg Subcutaneous Q24H  . insulin aspart  0-20 Units Subcutaneous TID WC  . insulin aspart  0-5 Units Subcutaneous QHS  . insulin aspart  4 Units Subcutaneous TID WC  . insulin glargine  42 Units Subcutaneous Q24H  . metoprolol tartrate  25 mg Oral BID  . QUEtiapine  300 mg Oral QHS   Continuous Infusions: . dextrose 5 % and 0.45% NaCl 100 mL/hr at 01/24/18 2133  . insulin 3.3 Units/hr (01/25/18 0918)  . lactated ringers 125 mL/hr at 01/24/18 1550     LOS: 1 day        Glade Lloyd, MD [facility] Pager [phone]  If 7PM-7AM, please contact night-coverage [email or web address] Password [facility] 01/25/2018, 10:32 AM

## 2018-01-25 NOTE — Care Management Note (Addendum)
Case Management Note  Patient Details  Name: Nancy [name] MRN: [id number] Date of Birth: [birth year]  Subjective/Objective:             DKA      [name] (Mother)     [phone]       PCP: Dr.Fleming  Action/Plan: Transition to home when medically stable. Hospital f/u: [facility]   [phone] [address] [place] [address]    Next Steps: Follow up on 02/09/2018 @ 10 am , Dr. Shan Levans         Pt states has transportation to home.  Expected Discharge Date:                  Expected Discharge Plan:  Home/Self Care  In-House Referral:  NA  Discharge planning Services  [facility], Follow-up appt scheduled, CM Consult  Post Acute Care Choice:    Choice offered to:  NA  DME Arranged:  N/A DME Agency:  NA  HH Arranged:  NA HH Agency:  NA  Status of Service:  Completed, signed off  If discussed at Long Length of Stay Meetings, dates discussed:    Additional Comments:  Epifanio Lesches, RN 01/25/2018, 4:38 PM

## 2018-01-25 NOTE — Progress Notes (Signed)
Patient still waiting on lunch tray. Pt states she can not wait to eat lunch. Pt must be NPO for 5 hours for ultrasound exam. Per Ultrasound, they will plan to perform ultrasound exam in the morning. Pt needs to be NPO after midnight. Information relayed to [place], RN. - Alwyn Ren RN

## 2018-01-25 NOTE — Progress Notes (Addendum)
Progress Note  Patient Name: Nancy [name] Date of Encounter: 01/25/2018  Primary Cardiologist: Dietrich Pates, MD  Subjective   Feeling terrible today. Chest and stomach still hurt. Stomach hurts worse with palpation. Chest hurts with palpation as well as shifting about in bed. No syncope since admission.  Inpatient Medications    Scheduled Meds: . amLODipine  5 mg Oral Daily  . enoxaparin (LOVENOX) injection  40 mg Subcutaneous Q24H  . metoprolol tartrate  25 mg Oral BID  . QUEtiapine  300 mg Oral QHS   Continuous Infusions: . sodium chloride 125 mL/hr at 01/24/18 1831  . dextrose 5 % and 0.45% NaCl 100 mL/hr at 01/24/18 2133  . insulin 3.3 Units/hr (01/25/18 0918)  . lactated ringers 125 mL/hr at 01/24/18 1550   PRN Meds: morphine injection   Vital Signs    Vitals:   01/24/18 1600 01/24/18 1828 01/24/18 2017 01/24/18 2230  BP: (!) 136/91 (!) 143/99  110/72  Pulse: (!) 106 99  100  Resp: 18 18  17   Temp:  98.7 F (37.1 C) 99.1 F (37.3 C) 98.1 F (36.7 C)  TempSrc:  Oral Oral Oral  SpO2: 100% 100%  99%  Weight:  83.3 kg    Height:  5\' 3"  (1.6 m)      Intake/Output Summary (Last 24 hours) at 01/25/2018 0925 Last data filed at 01/25/2018 0331 Gross per 24 hour  Intake 3362.99 ml  Output -  Net 3362.99 ml   Last 3 Weights 01/24/2018 01/16/2018 01/09/2018  Weight (lbs) 183 lb 10.3 oz 183 lb 182 lb 15.7 oz  Weight (kg) 83.3 kg 83.008 kg 83 kg     Telemetry    NSR/ST - Personally Reviewed  Physical Exam   GEN: No acute distress. Lying flat in bed without dyspnea HEENT: Normocephalic, atraumatic, sclera non-icteric. Neck: No JVD or bruits. Cardiac: RRR minimally elevated rate no murmurs, rubs, or gallops.  Radials/DP/PT 1+ and equal bilaterally.  Respiratory: Clear to auscultation bilaterally. Breathing is unlabored. GI: Soft, nontender, non-distended, BS +x 4. MS: no deformity. Extremities: No clubbing or cyanosis. No edema. Distal pedal pulses are 2+  and equal bilaterally. Neuro:  AAOx3. Follows commands. Psych:  Responds to questions appropriately with a normal affect.  Labs    Chemistry Recent Labs  Lab 01/24/18 1313 01/24/18 1600 01/24/18 2007 01/25/18 0114  NA 132* 136 131* 138  K 4.0 4.3 3.7 3.5  CL 96* 99 96* 107  CO2 18* 25 24 23   GLUCOSE 563* 358* 262* 269*  BUN 8 7 8 11   CREATININE 0.68 0.54 0.51 0.98  CALCIUM 9.4 9.4 8.5* 8.0*  PROT 7.1  --   --   --   ALBUMIN 3.9  --   --   --   AST 14*  --   --   --   ALT 23  --   --   --   ALKPHOS 81  --   --   --   BILITOT 0.8  --   --   --   GFRNONAA >60 >60 >60 >60  GFRAA >60 >60 >60 >60  ANIONGAP 18* 12 11 8      Hematology Recent Labs  Lab 01/24/18 1313  WBC 6.4  RBC 5.26*  HGB 14.8  HCT 45.3  MCV 86.1  MCH 28.1  MCHC 32.7  RDW 14.5  PLT 199    Cardiac Enzymes Recent Labs  Lab 01/24/18 2007 01/25/18 0114 01/25/18 0520  TROPONINI <0.03 <0.03 <  0.03   No results for input(s): TROPIPOC in the last 168 hours.   BNP Recent Labs  Lab 01/24/18 1313  BNP 4.8     DDimer No results for input(s): DDIMER in the last 168 hours.   Radiology    No results found.   Patient Profile     18 y.o. female with obesity, DM (dx age 65), precocious adrenarche, depression, chronic abdominal pain, DKA. She was admitted 11/2017 with confusion and syncope, having passed out in a car in the gas station - this was felt due to poorly controlled DM. Brain imaging nonacute. She had been taking occasional oxycocodone of her mother's. In mid 12/2017 she was admitted for DKA and left AMA but returned. She also had chest pain which was reproducible on palpation. CT angio 12/14/17 was negative for acute abnormality. She was recently re-admitted 12/19-12/21 for chest pain, abdominal pain and recurrent syncope both standing and sitting.2D echo showed EF 45-50% with possible global HK and OP f/u was recommended. Cardiac CT ordered by Dr. Delford Field 01/14/18 showed no evidence of CAD. She  presented back to [facility] 01/24/2018 with 3 weeks of mid-sternal chest pain, abdominal pain and recurrent syncopal episodes. Orthostatics were negative from BP standpoint but did note rise in HR 122->143.  Assessment & Plan    1. DKA with chronic abdominal pain/chest pain - per IM.  2. Chest pain - noncardiac. CT 01/2018 negative for CAD. Troponins negative. Multiple MSK components. No further cardiac w/u necessary for this.  3. Recurrent syncope - very atypical, passed out 10 times the day of admission but no significant injuries noted. Has not recurred while on telemetry as inpatient. Orthostatics showed rise in HR but no significant hypotension on standing. Consider OP event monitor - will review with MD. Discussed DMV recommendation of no driving x 6 months from last episode of syncope.  4. ? Cardiomyopathy - EF 45-50 in 12/2017. Dr. Tenny Craw reviewed and felt it was better as state. As per prior note, will repeat limited echo today with [facility] to clarify LVEF. Not clear why she is not on ACEI/ARB already for her DM but this could be considered (but would need contraception counseling to avoid pregnancy while taking med given category X).  5. Sinus tach likely related to #1. TSH wnl 01/16/18.  For questions or updates, please contact [facility] Please consult [email or web address] for contact info under Cardiology/STEMI.  Signed, Laurann Montana, PA-C 01/25/2018, 9:25 AM    Pt seen and examined  I agree with findings as noted above  Pt says she feels terrible   Has not been out of bed ON exam  HR 100s (ST) Lungs are CTA   Cardiac RRR  No murmurs   Ext are without edema  Recomm: Continue IV fluids   Again, I reviewed with her that she is dehydreated due to severe glucose elevation.     Orthostatics pending I would recomm repeat echo (limited ) to confirm LVEF   By my review of previous echo it appeared normal    Dietrich Pates

## 2018-01-26 ENCOUNTER — Inpatient Hospital Stay (HOSPITAL_COMMUNITY): Payer: Medicaid Other

## 2018-01-26 DIAGNOSIS — I10 Essential (primary) hypertension: Secondary | ICD-10-CM

## 2018-01-26 DIAGNOSIS — Z9119 Patient's noncompliance with other medical treatment and regimen: Secondary | ICD-10-CM

## 2018-01-26 LAB — COMPREHENSIVE METABOLIC PANEL
ALT: 16 U/L (ref 0–44)
AST: 13 U/L — ABNORMAL LOW (ref 15–41)
Albumin: 2.9 g/dL — ABNORMAL LOW (ref 3.5–5.0)
Alkaline Phosphatase: 51 U/L (ref 38–126)
Anion gap: 7 (ref 5–15)
BUN: 13 mg/dL (ref 6–20)
CO2: 27 mmol/L (ref 22–32)
Calcium: 8.5 mg/dL — ABNORMAL LOW (ref 8.9–10.3)
Chloride: 103 mmol/L (ref 98–111)
Creatinine, Ser: 0.65 mg/dL (ref 0.44–1.00)
GFR calc Af Amer: >60 mL/min (ref >60)
Glucose, Bld: 276 mg/dL — ABNORMAL HIGH (ref 70–99)
Potassium: 3.9 mmol/L (ref 3.5–5.1)
Sodium: 137 mmol/L (ref 135–145)
TOTAL PROTEIN: 5.1 g/dL — AB (ref 6.5–8.1)
Total Bilirubin: 0.3 mg/dL (ref 0.3–1.2)

## 2018-01-26 LAB — LIPASE, BLOOD: Lipase: 25 U/L (ref 11–51)

## 2018-01-26 LAB — GLUCOSE, CAPILLARY
Glucose-Capillary: 270 mg/dL — ABNORMAL HIGH (ref 70–99)
Glucose-Capillary: 314 mg/dL — ABNORMAL HIGH (ref 70–99)

## 2018-01-26 MED ORDER — INSULIN ASPART 100 UNIT/ML ~~LOC~~ SOLN: 0.0000 [IU] | Freq: Three times a day (TID) | SUBCUTANEOUS | 0 refills | Status: DC

## 2018-01-26 MED ORDER — INSULIN ASPART 100 UNIT/ML ~~LOC~~ SOLN: 6.0000 [IU] | Freq: Three times a day (TID) | SUBCUTANEOUS | Status: DC

## 2018-01-26 MED ORDER — ONDANSETRON HCL 4 MG PO TABS: 4.0000 mg | ORAL_TABLET | Freq: Three times a day (TID) | ORAL | 0 refills | Status: DC | PRN

## 2018-01-26 MED ORDER — INSULIN GLARGINE 100 UNIT/ML SOLOSTAR PEN: 60.0000 [IU] | PEN_INJECTOR | Freq: Every day | SUBCUTANEOUS | Status: DC

## 2018-01-26 MED ORDER — PANTOPRAZOLE SODIUM 40 MG PO TBEC: 40.0000 mg | DELAYED_RELEASE_TABLET | Freq: Every day | ORAL | 0 refills | Status: DC

## 2018-01-26 MED ORDER — INSULIN GLARGINE 100 UNIT/ML ~~LOC~~ SOLN
50.0000 [IU] | SUBCUTANEOUS | Status: DC
  Administered 2018-01-26: 50 [IU] via SUBCUTANEOUS
  Filled 2018-01-26: qty 0.5

## 2018-01-26 MED ORDER — ALUM & MAG HYDROXIDE-SIMETH 200-200-20 MG/5ML PO SUSP: 30.0000 mL | ORAL | 0 refills | Status: DC | PRN

## 2018-01-26 MED ORDER — TRAMADOL HCL 50 MG PO TABS: 50.0000 mg | ORAL_TABLET | Freq: Four times a day (QID) | ORAL | 0 refills | Status: DC | PRN

## 2018-01-26 MED FILL — traMADol HCL 50 MG TABS: 50 | 5 days supply | Qty: 20 | Fill #0

## 2018-01-26 NOTE — Plan of Care (Signed)
  Problem: Education: Goal: Knowledge of General Education information will improve Description Including pain rating scale, medication(s)/side effects and non-pharmacologic comfort measures Outcome: Adequate for Discharge   Problem: Pain Managment: Goal: General experience of comfort will improve Outcome: Adequate for Discharge   

## 2018-01-26 NOTE — Evaluation (Signed)
Physical Therapy Evaluation Patient Details Name: Nancy [name] MRN: [id number] DOB: 06-12-2000 Today's Date: 01/26/2018   History of Present Illness  Pt is a 18yo female admitted with complaints of frequent loss of consciousness (3-4 times/day) when sitting or standing, suspected due to orthostatic hypotension. She has DM and CHF, and was determined to have DKA upon admission.  Clinical Impression  Pt is at or close to baseline functioning and should be safe at home alone. There are no further acute PT needs.  Will sign off at this time.     Follow Up Recommendations No PT follow up    Equipment Recommendations  None recommended by PT    Recommendations for Other Services       Precautions / Restrictions Precautions Precautions: Fall      Mobility  Bed Mobility Overal bed mobility: Modified Independent                Transfers Overall transfer level: Modified independent                  Ambulation/Gait Ambulation/Gait assistance: Independent(in a homelike environment) Gait Distance (Feet): 700 Feet Assistive device: None Gait Pattern/deviations: Step-through pattern   Gait velocity interpretation: >4.37 ft/sec, indicative of normal walking speed    Stairs Stairs: Yes Stairs assistance: Modified independent (Device/Increase time) Stair Management: One rail Right;Alternating pattern;Forwards Number of Stairs: 12 General stair comments: safe with rail  Wheelchair Mobility    Modified Rankin (Stroke Patients Only)       Balance Overall balance assessment: Modified Independent                               Standardized Balance Assessment Standardized Balance Assessment : Dynamic Gait Index   Dynamic Gait Index Level Surface: Normal Change in Gait Speed: Normal Gait with Horizontal Head Turns: Normal Gait with Vertical Head Turns: Normal Gait and Pivot Turn: Normal Step Over Obstacle: Normal Step Around Obstacles:  Normal Steps: Mild Impairment Total Score: 23       Pertinent Vitals/Pain Pain Assessment: Faces Faces Pain Scale: Hurts little more Pain Location: chest, abdomen Pain Descriptors / Indicators: Discomfort;Constant Pain Intervention(s): Monitored during session    Home Living                        Prior Function                 Hand Dominance        Extremity/Trunk Assessment                Communication      Cognition Arousal/Alertness: Awake/alert Behavior During Therapy: WFL for tasks assessed/performed Overall Cognitive Status: Within Functional Limits for tasks assessed                                        General Comments General comments (skin integrity, edema, etc.): Completing balance activity at age appropriate speeds for a 18 y/o did not exacerbate pt's pain, but pt did hurt consistently.    Exercises     Assessment/Plan    PT Assessment    PT Problem List         PT Treatment Interventions      PT Goals (Current goals can be found in the Care Plan section)  Acute Rehab PT  Goals Patient Stated Goal: go home PT Goal Formulation: With patient Time For Goal Achievement: 02/08/18 Potential to Achieve Goals: Good    Frequency     Barriers to discharge        Co-evaluation               AM-PAC PT "6 Clicks" Mobility  Outcome Measure Help needed turning from your back to your side while in a flat bed without using bedrails?: None Help needed moving from lying on your back to sitting on the side of a flat bed without using bedrails?: None Help needed moving to and from a bed to a chair (including a wheelchair)?: None Help needed standing up from a chair using your arms (e.g., wheelchair or bedside chair)?: None Help needed to walk in hospital room?: None Help needed climbing 3-5 steps with a railing? : None 6 Click Score: 24    End of Session   Activity Tolerance: Patient tolerated treatment  well Patient left: in chair;with call bell/phone within reach Nurse Communication: Mobility status PT Visit Diagnosis: Pain Pain - part of body: (chest)    Time: 2440-1027 PT Time Calculation (min) (ACUTE ONLY): 21 min   Charges:     PT Treatments $Gait Training: 8-22 mins        01/26/2018  Plains Bing, PT Acute Rehabilitation Services [phone]  (pager) [phone]  (office)  Eliseo Gum Kennedy Brines 01/26/2018, 1:08 PM

## 2018-01-26 NOTE — Progress Notes (Addendum)
Inpatient Diabetes Program Recommendations  AACE/ADA: New Consensus Statement on Inpatient Glycemic Control  Target Ranges:  Prepandial:   less than 140 mg/dL      Peak postprandial:   less than 180 mg/dL (1-2 hours)      Critically ill patients:  140 - 180 mg/dL  Results for ASHIMA, LAFRAMBOISE (MRN [id number]) as of 01/26/2018 07:39  Ref. Range 01/26/2018 03:27  Glucose Latest Ref Range: 70 - 99 mg/dL 629 (H)   Results for YURICO, BASNETT (MRN [id number]) as of 01/26/2018 07:39  Ref. Range 01/25/2018 07:55 01/25/2018 09:16 01/25/2018 10:28 01/25/2018 11:56 01/25/2018 13:29 01/25/2018 17:30 01/25/2018 20:55  Glucose-Capillary Latest Ref Range: 70 - 99 mg/dL 85 010 (H) 272 (H) 536 (H) 215 (H) 334 (H) 193 (H)   Review of Glycemic Control  Diabetes history: DM2 (managed as DM1) Outpatient Diabetes medications: Lantus 60 units QHS, Humalog 10 units BID Current orders for Inpatient glycemic control: Lantus 42 units Q24H, Novolog 0-20 units TID with meals, Novolog 0-5 units QHS, Novolog 4 units TID with meals for meal coverage  Inpatient Diabetes Program Recommendations:   Insulin-Basal: Please consider increasing Lantus to 50 units Q24H.  Insulin-Meal Coverage: Please consider increasing meal coverage to Novolog 6 units TID with meals.  HgbA1C: A1C 12% on 01/24/18 indicating an average glucose of 298 mg/dl. Patient was last seen by Diabetes Coordinator on 12/25/17 during last hospital admission. Patient has been seen by Inpatient Diabetes team multiple times over the past 6 months.  Patient was seen by Diabetes Coordinator again yesterday.  Patient would benefit from having a correction scale to use with the Humalog (along with meal coverage) AC&HS. Please consider adding a correction scale for the Humalog at time of discharge.   Thanks, Orlando Penner, RN, MSN, CDE Diabetes Coordinator Inpatient Diabetes Program [phone] (Team Pager from 8am to 5pm)

## 2018-01-26 NOTE — Progress Notes (Addendum)
Progress Note  Patient Name: Nancy [name] Date of Encounter: 01/26/2018  Primary Cardiologist: Dietrich Pates, MD  Subjective   Achy all over   Not dizzy  Inpatient Medications    Scheduled Meds: . amLODipine  5 mg Oral Daily  . enoxaparin (LOVENOX) injection  40 mg Subcutaneous Q24H  . insulin aspart  0-20 Units Subcutaneous TID WC  . insulin aspart  0-5 Units Subcutaneous QHS  . insulin aspart  4 Units Subcutaneous TID WC  . insulin glargine  42 Units Subcutaneous Q24H  . metoprolol tartrate  25 mg Oral BID  . pantoprazole  40 mg Oral Daily  . QUEtiapine  300 mg Oral QHS   Continuous Infusions:  PRN Meds: alum & mag hydroxide-simeth, morphine injection   Vital Signs    Vitals:   01/24/18 2230 01/25/18 1420 01/25/18 2239 01/26/18 0531  BP: 110/72  (!) 97/56 (!) 93/58  Pulse: 100 (!) 102 91 84  Resp: 17  16 16   Temp: 98.1 F (36.7 C)  98.5 F (36.9 C) 97.7 F (36.5 C)  TempSrc: Oral  Oral Oral  SpO2: 99%  98% 99%  Weight:      Height:        Intake/Output Summary (Last 24 hours) at 01/26/2018 0715 Last data filed at 01/25/2018 1204 Gross per 24 hour  Intake 873.14 ml  Output -  Net 873.14 ml   Last 3 Weights 01/24/2018 01/16/2018 01/09/2018  Weight (lbs) 183 lb 10.3 oz 183 lb 182 lb 15.7 oz  Weight (kg) 83.3 kg 83.008 kg 83 kg     Telemetry    SR- Personally Reviewed  Physical Exam   GEN: No acute distress. Lying flat in bed without dyspnea HEENT: Normocephalic, atraumatic, sclera non-icteric. Neck: No JVD   Cardiac: RRR no murmurs, rubs, or gallops.  Radials/DP/PT 1+ and equal bilaterally.  Respiratory: Clear to auscultation bilaterally. Breathing is unlabored. GI: Soft, nontender, non-distended, BS +x 4. MS: no deformity. Extremities: No clubbing or cyanosis. No edema. Distal pedal pulses are 2+ and equal bilaterally. Neuro:  AAOx3. Follows commands.  Labs    Chemistry Recent Labs  Lab 01/24/18 1313  01/24/18 2007 01/25/18 0114  01/26/18 0327  NA 132*   < > 131* 138 137  K 4.0   < > 3.7 3.5 3.9  CL 96*   < > 96* 107 103  CO2 18*   < > 24 23 27   GLUCOSE 563*   < > 262* 269* 276*  BUN 8   < > 8 11 13   CREATININE 0.68   < > 0.51 0.98 0.65  CALCIUM 9.4   < > 8.5* 8.0* 8.5*  PROT 7.1  --   --   --  5.1*  ALBUMIN 3.9  --   --   --  2.9*  AST 14*  --   --   --  13*  ALT 23  --   --   --  16  ALKPHOS 81  --   --   --  51  BILITOT 0.8  --   --   --  0.3  GFRNONAA >60   < > >60 >60 >60  GFRAA >60   < > >60 >60 >60  ANIONGAP 18*   < > 11 8 7    < > = values in this interval not displayed.     Hematology Recent Labs  Lab 01/24/18 1313  WBC 6.4  RBC 5.26*  HGB 14.8  HCT  45.3  MCV 86.1  MCH 28.1  MCHC 32.7  RDW 14.5  PLT 199    Cardiac Enzymes Recent Labs  Lab 01/24/18 2007 01/25/18 0114 01/25/18 0520  TROPONINI <0.03 <0.03 <0.03   No results for input(s): TROPIPOC in the last 168 hours.   BNP Recent Labs  Lab 01/24/18 1313  BNP 4.8     DDimer No results for input(s): DDIMER in the last 168 hours.   Radiology    US Abdomen Limited Ruq  Result Date: 01/26/2018 CLINICAL DATA:  Abdominal pain EXAM: ULTRASOUND ABDOMEN LIMITED RIGHT UPPER QUADRANT COMPARISON:  12/24/2017 abdominal CT FINDINGS: Gallbladder: No gallstones or wall thickening visualized. No sonographic Murphy sign noted by sonographer. Common bile duct: Diameter: 5 mm.  Where visualized, no filling defect. Liver: No focal lesion identified. Within normal limits in parenchymal echogenicity. Portal vein is patent on color Doppler imaging with normal direction of blood flow towards the liver. IMPRESSION: Negative right upper quadrant ultrasound. Electronically Signed   By: Marnee Spring M.D.   On: 01/26/2018 04:53     Patient Profile     18 y.o. female with obesity, DM (dx age 40), precocious adrenarche, depression, chronic abdominal pain, DKA. She was admitted 11/2017 with confusion and syncope, having passed out in a car [place] the gas  station - this was felt due to poorly controlled DM. Brain imaging nonacute. She had been taking occasional oxycocodone of her mother's. In mid 12/2017 she was admitted for DKA and left AMA but returned. She also had chest pain which was reproducible on palpation. CT angio 12/14/17 was negative for acute abnormality. She was recently re-admitted 12/19-12/21 for chest pain, abdominal pain and recurrent syncope both standing and sitting.2D echo showed EF 45-50% with possible global HK and OP f/u was recommended. Cardiac CT ordered by Dr. Delford Field 01/14/18 showed no evidence of CAD. She presented back to [facility] 01/24/2018 with 3 weeks of mid-sternal chest pain, abdominal pain and recurrent syncopal episodes. Orthostatics were negative from BP standpoint but did note rise in HR 122->143.  Assessment & Plan    1. DKA  - per IM.  2. Chest pain - CTA 01/2018 negative for CAD. Troponins negative. Multiple MSK complaints  3. Recurrent syncope - Pt has finished IV fluids    Will get orthostatics today     Stop amlodipine     Ambulate and follow HR and BP  4. Cardiomyopathy Nonischemic with mild LV dysfunction - EF 45-50 in 12/2017. I will review echo done yesterday  Reported the same LVEF 45 to 50%   Volume status is OK    For questions or updates, please contact [facility] Please consult [email or web address] for contact info under Cardiology/STEMI.  Signed, Dietrich Pates, MD 01/26/2018, 7:15 AM

## 2018-01-26 NOTE — Discharge Summary (Signed)
Physician Discharge Summary  Nancy [name] KCM:[id number] DOB: 02/22/2000 DOA: 01/24/2018  PCP: [name], NP  Admit date: 01/24/2018 Discharge date: 01/26/2018  Admitted From:Home Disposition:  Home  Recommendations for Outpatient Follow-up:  1. Follow up with PCP in 1 week 2. Follow-up with gastroenterology as an outpatient 3. Follow-up with cardiology as an outpatient 4. Comply with medications and follow-up   Home Health: No Equipment/Devices: None  Discharge Condition: Stable CODE STATUS: Full Diet recommendation: Carb modified  Brief/Interim Summary: 18 year old female with history of diabetes mellitus, chronic noncompliance, congestive heart failure with ejection fraction of 44%, hypertension, chronic abdominal pain presented with recurrent syncope.  She was found to have DKA and started on insulin drip and IV fluids.  Cardiology was consulted.  After her anion gap closed, she was transitioned to long-acting insulin.  No more syncopal episodes since admission.  Amlodipine has been stopped, no signs of orthostatic hypotension.  Cardiology has cleared the patient for discharge.  Right upper quadrant ultrasound was negative for gallstones.  Patient will be discharged with outpatient follow-up with cardiology and gastroenterology.  Discharge Diagnoses:  Active Problems:   Obesity   Hypertension   Acanthosis nigricans   Goiter   Adjustment disorder   Non compliance with medical treatment   Sinus tachycardia by electrocardiogram   Uncontrolled type 2 diabetes mellitus with hyperglycemia (HCC)   Orthostatic hypotension   DKA (diabetic ketoacidosis) ([place])  DKA in a patient with history of diabetes mellitus type 2 and noncompliance -Started on insulin drip.  Anion gap closed.  Transitioned to long-acting insulin along with NovoLog with meals and sliding scale insulin.  Blood sugars improving.  Discharge home on current home regimen of Lantus 60 units at bedtime and  NovoLog 10 units twice a day with meals along with sliding scale insulin.  Recurrent syncope -Unknown cause.  No orthostatic hypotension.  -No further syncopal episodes in the hospital -Cardiology evaluation appreciated.  Limited echo showed EF of 45 to 50%.  Cardiology has discontinued amlodipine.  Cardiology has cleared the patient for discharge.  Outpatient follow-up with cardiology.  cardiomyopathy with EF of 45 to 50% in 12/2017 -strict input and output.  Daily weights.    Clear from my allergy continue metoprolol.  Repeat limited echo showed EF of 45 to 50%.  Outpatient follow-up with cardiology.  Amlodipine has been discontinued.  Chronic chest and abdominal pain -Patient has had extensive work-up with GI as an outpatient.  Right upper quadrant ultrasound was negative for gallstones. Outpatient follow-up with GI.    Continue Protonix and antacids.   -No further work-up as per cardiology at this time.  Outpatient follow-up   Discharge Instructions  Discharge Instructions    Ambulatory referral to Cardiology   Complete by:  As directed    Follow-up for recurrent syncope   Ambulatory referral to Gastroenterology   Complete by:  As directed    Abdominal pain   What is the reason for referral?:  Other   Call MD for:  persistant nausea and vomiting   Complete by:  As directed    Call MD for:  severe uncontrolled pain   Complete by:  As directed    Diet - low sodium heart healthy   Complete by:  As directed    Diet Carb Modified   Complete by:  As directed    Increase activity slowly   Complete by:  As directed      Allergies as of 01/26/2018  Reactions   Ibuprofen Other (See Comments)   GI MD said to not take this anymore      Medication List    STOP taking these medications   amLODipine 5 MG tablet Commonly known as:  NORVASC   meloxicam 15 MG tablet Commonly known as:  MOBIC   prochlorperazine 10 MG tablet Commonly known as:  COMPAZINE   promethazine 25  MG tablet Commonly known as:  PHENERGAN     TAKE these medications   alum & mag hydroxide-simeth 200-200-20 MG/5ML suspension Commonly known as:  MAALOX/MYLANTA Take 30 mLs by mouth every 4 (four) hours as needed for indigestion or heartburn.   diclofenac sodium 1 % Gel Commonly known as:  VOLTAREN Apply 2 g topically 4 (four) times daily. What changed:    when to take this  additional instructions   glucagon 1 MG injection Use for Severe Hypoglycemia . Inject 1 mg intramuscularly if unresponsive, unable to swallow, unconscious and/or has seizure What changed:    how much to take  how to take this  when to take this  reasons to take this  additional instructions   glucose blood test strip Commonly known as:  TRUE METRIX BLOOD GLUCOSE TEST Use as instructed   insulin aspart 100 UNIT/ML injection Commonly known as:  novoLOG Inject 0-20 Units into the skin 3 (three) times daily with meals.   Insulin Glargine 100 UNIT/ML Solostar Pen Commonly known as:  LANTUS Inject 60 Units into the skin at bedtime.   insulin lispro 100 UNIT/ML injection Commonly known as:  HUMALOG Inject 10 Units into the skin 2 (two) times daily with a meal.   Insulin Pen Needle 31G X 5 MM Misc Commonly known as:  B-D UF III MINI PEN NEEDLES Use as instructed. Monitor blood glucose levels twice per day   metoprolol tartrate 25 MG tablet Commonly known as:  LOPRESSOR Take 25 mg by mouth 2 (two) times daily.   ondansetron 4 MG tablet Commonly known as:  ZOFRAN Take 1 tablet (4 mg total) by mouth every 8 (eight) hours as needed for nausea or vomiting.   pantoprazole 40 MG tablet Commonly known as:  PROTONIX Take 1 tablet (40 mg total) by mouth daily. Start taking on:  January 27, 2018   QUEtiapine 100 MG tablet Commonly known as:  SEROQUEL Take 300 mg by mouth at bedtime.   traMADol 50 MG tablet Commonly known as:  ULTRAM Take 1 tablet (50 mg total) by mouth every 6 (six) hours as  needed for moderate pain. What changed:  reasons to take this   TRUE METRIX METER w/Device Kit Use as instructed. Monitor blood glucose levels twice per day   TRUEPLUS LANCETS 28G Misc Use as instructed. Monitor blood glucose levels twice per day      Follow-up [facility] Follow up on 02/09/2018.   Why:  10 am, Dr. Ferdinand Lango information: [place] [id number] [phone]       Yetta Flock, MD. Schedule an appointment as soon as possible for a visit in 1 week(s).   Specialty:  Gastroenterology Contact information: [address] [place] [address] [phone]          Allergies  Allergen Reactions  . Ibuprofen Other (See Comments)    GI MD said to not take this anymore    Consultations:  cardiology   Procedures/Studies: Dg Chest 2 View  Result Date: 01/16/2018 CLINICAL DATA:  Chest pain and shortness of breath. Two recent syncopal episodes. EXAM: CHEST - 2 VIEW COMPARISON:  01/09/2018 FINDINGS: The heart size and mediastinal contours are within normal limits. Both lungs are clear. The visualized skeletal structures are unremarkable. IMPRESSION: Negative.  No active cardiopulmonary disease. Electronically Signed   By: Earle Gell M.D.   On: 01/16/2018 17:46   Dg Chest 2 View  Result Date: 01/09/2018 CLINICAL DATA:  Chest pain, shortness of breath and vomiting. EXAM: CHEST - 2 VIEW COMPARISON:  12/23/2017 FINDINGS: The heart size and mediastinal contours are within normal limits. There is no evidence of pulmonary edema, consolidation, pneumothorax, nodule or pleural fluid. The visualized skeletal structures are unremarkable. IMPRESSION: No active cardiopulmonary disease. Electronically Signed   By: Aletta Edouard M.D.   On: 01/09/2018 15:58   Ct Coronary Morph W/cta Cor W/score W/ca W/cm &/or Wo/cm  Addendum Date: 01/14/2018   ADDENDUM REPORT: 01/14/2018 17:17  CLINICAL DATA:  18 year old female with recurrent atypical chest pain. EXAM: Cardiac/Coronary  CT TECHNIQUE: The patient was scanned on a [employer]. FINDINGS: A 120 kV prospective scan was triggered in the descending thoracic aorta at 111 HU's. Axial non-contrast 3 mm slices were carried out through the heart. The data set was analyzed on a dedicated work station and scored using the [place]. Gantry rotation speed was 250 msecs and collimation was .6 mm. No beta blockade and 0.8 mg of sl NTG was given. The 3D data set was reconstructed in 5% intervals of the 67-82 % of the R-R cycle. Diastolic phases were analyzed on a dedicated work station using MPR, MIP and VRT modes. The patient received 80 cc of contrast. Aorta:  Normal size.  No calcifications.  No dissection. Aortic Valve:  Trileaflet.  No calcifications. Coronary Arteries:  Normal coronary origin.  Right dominance. RCA is a large dominant artery that gives rise to PDA and PLVB. There is no plaque. Left main is a very large artery that gives rise to LAD and LCX arteries. Left main has no plaque. LAD is a large vessel that gives rise to one small diagonal artery and has no plaque. LCX is a non-dominant artery that gives rise to two large OM branches. There is no plaque. Other findings: Normal pulmonary vein drainage into the left atrium. Normal let atrial appendage without a thrombus. Normal size of the pulmonary artery. IMPRESSION: 1. Coronary calcium score of 0. This was 0 percentile for age and sex matched control. 2. Normal coronary origin with right dominance. 3. No evidence of CAD.  Consider non-cardiac causes of chest pain. Ena Dawley Electronically Signed   By: Ena Dawley   On: 01/14/2018 17:17   Result Date: 01/14/2018 EXAM: OVER-READ INTERPRETATION  CT CHEST The following report is an over-read performed by radiologist Dr. Rolm Baptise of [facility], [place] on 01/14/2018. This over-read does not include  interpretation of cardiac or coronary anatomy or pathology. The coronary CTA interpretation by the cardiologist is attached. COMPARISON:  12/14/2017 FINDINGS: Vascular: Heart is normal size.  Visualized aorta normal caliber. Mediastinum/Nodes: No adenopathy in the lower mediastinum or hila. Lungs/Pleura: Visualized lungs clear.  No effusions. Upper Abdomen: Imaging into the upper abdomen shows no acute findings. Musculoskeletal: Chest wall soft tissues are unremarkable. No acute bony abnormality. IMPRESSION: No acute or significant extracardiac abnormality. Electronically Signed: By: Rolm Baptise M.D. On: 01/14/2018 13:57   US Abdomen Limited Ruq  Result Date: 01/26/2018 CLINICAL DATA:  Abdominal  pain EXAM: ULTRASOUND ABDOMEN LIMITED RIGHT UPPER QUADRANT COMPARISON:  12/24/2017 abdominal CT FINDINGS: Gallbladder: No gallstones or wall thickening visualized. No sonographic Murphy sign noted by sonographer. Common bile duct: Diameter: 5 mm.  Where visualized, no filling defect. Liver: No focal lesion identified. Within normal limits in parenchymal echogenicity. Portal vein is patent on color Doppler imaging with normal direction of blood flow towards the liver. IMPRESSION: Negative right upper quadrant ultrasound. Electronically Signed   By: Monte Fantasia M.D.   On: 01/26/2018 04:53    Echo on 01/25/2018 Study Conclusions  - Left ventricle: The cavity size was normal. Systolic function was   mildly reduced. The estimated ejection fraction was in the range   of 45% to 50%. Diffuse hypokinesis.  Impressions:  - Limited images of LV only.  Subjective: Patient seen and examined at bedside.  She denies worsening abdominal pain or chest pain.  Tolerating diet.  Thinks that she is okay to go home.  No overnight fever or vomiting.  Discharge Exam: Vitals:   01/26/18 0938 01/26/18 0940  BP: 105/71 (!) 107/58  Pulse: 89   Resp:    Temp:    SpO2:     Vitals:   01/26/18 0531 01/26/18 0936  01/26/18 0938 01/26/18 0940  BP: (!) 93/58 (!) 97/59 105/71 (!) 107/58  Pulse: 84 81 89   Resp: 16 16    Temp: 97.7 F (36.5 C) (!) 97.4 F (36.3 C)    TempSrc: Oral Axillary    SpO2: 99% 100%    Weight:      Height:        General: Pt is alert, awake, not in acute distress Cardiovascular: rate controlled, S1/S2 + Respiratory: bilateral decreased breath sounds at bases Abdominal: Soft, obese, mildly tender in the epigastric region, ND, bowel sounds + Extremities: Trace edema, no cyanosis    The results of significant diagnostics from this hospitalization (including imaging, microbiology, ancillary and laboratory) are listed below for reference.     Microbiology: Recent Results (from the past 240 hour(s))  Urine culture     Status: Abnormal   Collection Time: 01/16/18  5:09 PM  Result Value Ref Range Status   Specimen Description URINE, RANDOM  Final   Special Requests   Final    NONE Performed at [facility], [address] [place], [place] [address]    Culture MULTIPLE SPECIES PRESENT, SUGGEST RECOLLECTION (A)  Final   Report Status 01/18/2018 FINAL  Final  MRSA PCR Screening     Status: None   Collection Time: 01/24/18  6:40 PM  Result Value Ref Range Status   MRSA by PCR NEGATIVE NEGATIVE Final    Comment:        The GeneXpert MRSA Assay (FDA approved for NASAL specimens only), is one component of a comprehensive MRSA colonization surveillance program. It is not intended to diagnose MRSA infection nor to guide or monitor treatment for MRSA infections. Performed at [facility], [place] [address] [place], [place] [address]      Labs: BNP (last 3 results) Recent Labs    01/16/18 1830 01/24/18 1313  BNP 5.6 4.8   Basic Metabolic Panel: Recent Labs  Lab 01/24/18 1313 01/24/18 1600 01/24/18 2007 01/25/18 0114 01/26/18 0327  NA 132* 136 131* 138 137  K 4.0 4.3 3.7 3.5 3.9  CL 96* 99 96* 107 103  CO2 18* _0 GLUCOSE 563* 358*  262* 269* 276*  BUN 8  _0 CREATININE 0.68 0.54 0.51 0.98 0.65  CALCIUM 9.4 9.4 8.5* 8.0* 8.5*  MG 2.1  --   --   --   --    Liver Function Tests: Recent Labs  Lab 01/24/18 1313 01/26/18 0327  AST 14* 13*  ALT 23 16  ALKPHOS 81 51  BILITOT 0.8 0.3  PROT 7.1 5.1*  ALBUMIN 3.9 2.9*   Recent Labs  Lab 01/26/18 0327  LIPASE 25   No results for input(s): AMMONIA in the last 168 hours. CBC: Recent Labs  Lab 01/24/18 1313  WBC 6.4  NEUTROABS 4.0  HGB 14.8  HCT 45.3  MCV 86.1  PLT 199   Cardiac Enzymes: Recent Labs  Lab 01/24/18 2007 01/25/18 0114 01/25/18 0520  TROPONINI <0.03 <0.03 <0.03   BNP: Invalid input(s): POCBNP CBG: Recent Labs  Lab 01/25/18 1156 01/25/18 1329 01/25/18 1730 01/25/18 2055 01/26/18 0848  GLUCAP 169* 215* 334* 193* 270*   D-Dimer No results for input(s): DDIMER in the last 72 hours. Hgb A1c Recent Labs    01/24/18 1819  HGBA1C 12.0*   Lipid Profile No results for input(s): CHOL, HDL, LDLCALC, TRIG, CHOLHDL, LDLDIRECT in the last 72 hours. Thyroid function studies No results for input(s): TSH, T4TOTAL, T3FREE, THYROIDAB in the last 72 hours.  Invalid input(s): FREET3 Anemia work up No results for input(s): VITAMINB12, FOLATE, FERRITIN, TIBC, IRON, RETICCTPCT in the last 72 hours. Urinalysis    Component Value Date/Time   COLORURINE STRAW (A) 01/24/2018 1313   APPEARANCEUR HAZY (A) 01/24/2018 1313   LABSPEC 1.036 (H) 01/24/2018 1313   PHURINE 5.0 01/24/2018 1313   GLUCOSEU >=500 (A) 01/24/2018 1313   HGBUR NEGATIVE 01/24/2018 1313   [place] 01/24/2018 1313   BILIRUBINUR negative 12/28/2017 1612   KETONESUR 5 (A) 01/24/2018 1313   PROTEINUR NEGATIVE 01/24/2018 1313   UROBILINOGEN 1.0 12/28/2017 1612   UROBILINOGEN 1.0 07/09/2017 1324   NITRITE NEGATIVE 01/24/2018 1313   LEUKOCYTESUR NEGATIVE 01/24/2018 1313   Sepsis Labs Invalid input(s): PROCALCITONIN,  WBC,  LACTICIDVEN Microbiology Recent  Results (from the past 240 hour(s))  Urine culture     Status: Abnormal   Collection Time: 01/16/18  5:09 PM  Result Value Ref Range Status   Specimen Description URINE, RANDOM  Final   Special Requests   Final    NONE Performed at [facility], [place] [address] [place], [place] [address]    Culture MULTIPLE SPECIES PRESENT, SUGGEST RECOLLECTION (A)  Final   Report Status 01/18/2018 FINAL  Final  MRSA PCR Screening     Status: None   Collection Time: 01/24/18  6:40 PM  Result Value Ref Range Status   MRSA by PCR NEGATIVE NEGATIVE Final    Comment:        The GeneXpert MRSA Assay (FDA approved for NASAL specimens only), is one component of a comprehensive MRSA colonization surveillance program. It is not intended to diagnose MRSA infection nor to guide or monitor treatment for MRSA infections. Performed at [facility], [place] [address] [place], [place] [address]      Time coordinating discharge: 35 minutes  SIGNED:   Aline August, MD  [facility] 01/26/2018, 11:20 AM Pager: [phone]  If 7PM-7AM, please contact night-coverage [email or web address] Password [email or web address]

## 2018-01-26 NOTE — Progress Notes (Signed)
Patient discharged home. Discharge instruction given to patient. All questions answered and concerns addressed. Patient verbalized understanding.

## 2018-01-27 ENCOUNTER — Telehealth: Payer: Self-pay

## 2018-01-27 NOTE — Telephone Encounter (Signed)
Nancy [name],  From the discharge call:    She stated that she is feeling " okay."  Said she still has experienced episodes of abdominal and chest pain.  She understands when to return to ED.  Marland Kitchen    She said that she is taking lantus 60 units at bedtime and humalog10 units twice daily with a meal. Both noted on the AVS.    Novolog is also listed on the AVS to take 0-20 units three times daily.  She does not have the novolog and stated that no one spoke to her about a sliding scale.  Informed her that this CM would ask her PCP to clarify the orders for novolog and humalog.

## 2018-01-27 NOTE — Telephone Encounter (Signed)
Transition Care Management Follow-up Telephone Call  Date of discharge and from where: 01/26/2018 , [facility]   How have you been since you were released from the hospital? She stated that she is feeling " okay."  Said she still has experienced episodes of abdominal and chest pain.  She understands when to return to ED.   Any questions or concerns? Her only question is regarding the order for novolog  - please see below.   Items Reviewed:  Did the pt receive and understand the discharge instructions provided? Yes, she said that she received the instructions and did not have any questions.   Medications obtained and verified? She stated that she has the medication list from her AVS and she did not have any questions.  She then noted that she has not picked up the new medications - maalox, novolog, ondansetron, pantoprazole and tramadol. She said that she is taking lantus 60 units at bedtime and humalog10 units twice daily with a meal.  Novolog is also listed on the AVS to take 0-20 units three times daily.  She does not have the novolog and stated that no one spoke to her about a sliding scale.  Informed her that this CM would ask her PCP to clarify the orders for novolog and humalog.   Any new allergies since your discharge? None reported   Do you have support at home? Her mother  Other (ie: DME, Home Health, etc) she has a glucometer and checks her blood sugars twice a day - fasting in the morning and then at night. This morning her blood sugar was 183.  She reports that she keeps a log of her blood sugars.   Functional Questionnaire: (I = Independent and D = Dependent) ADL's: independent   Follow up appointments reviewed:    PCP Hospital f/u appt confirmed? Appointment scheduled with Bertram Denver, NP for 02/01/2018 @ 1050.    Specialist Hospital f/u appt confirmed? GI appointment 01/28/2018. Needs to schedule an appointment with cardiology needs to be scheduled.   Are  transportation arrangements needed? She said that she has transportation.   If their condition worsens, is the pt aware to call  their PCP or go to the ED? yes  Was the patient provided with contact information for the PCP's office or ED? She has the phone number  Was the pt encouraged to call back with questions or concerns? yes

## 2018-01-28 ENCOUNTER — Ambulatory Visit: Payer: Medicaid Other | Admitting: Gastroenterology

## 2018-01-29 ENCOUNTER — Telehealth: Payer: Self-pay | Admitting: Nurse Practitioner

## 2018-01-29 ENCOUNTER — Other Ambulatory Visit: Payer: Self-pay

## 2018-01-29 ENCOUNTER — Other Ambulatory Visit: Payer: Self-pay | Admitting: Nurse Practitioner

## 2018-01-29 ENCOUNTER — Observation Stay (HOSPITAL_COMMUNITY)
Admission: EM | Admit: 2018-01-29 | Discharge: 2018-02-01 | Disposition: A | Discharge status: Home / Self Care | Payer: Medicaid Other | Attending: Internal Medicine | Admitting: Internal Medicine

## 2018-01-29 ENCOUNTER — Encounter (HOSPITAL_COMMUNITY): Payer: Self-pay

## 2018-01-29 ENCOUNTER — Emergency Department (HOSPITAL_COMMUNITY): Payer: Medicaid Other

## 2018-01-29 DIAGNOSIS — E1065 Type 1 diabetes mellitus with hyperglycemia: Secondary | ICD-10-CM | POA: Diagnosis not present

## 2018-01-29 DIAGNOSIS — Z79899 Other long term (current) drug therapy: Secondary | ICD-10-CM | POA: Diagnosis not present

## 2018-01-29 DIAGNOSIS — R55 Syncope and collapse: Principal | ICD-10-CM | POA: Diagnosis present

## 2018-01-29 DIAGNOSIS — N181 Chronic kidney disease, stage 1: Secondary | ICD-10-CM

## 2018-01-29 DIAGNOSIS — I509 Heart failure, unspecified: Secondary | ICD-10-CM | POA: Diagnosis not present

## 2018-01-29 DIAGNOSIS — R112 Nausea with vomiting, unspecified: Secondary | ICD-10-CM

## 2018-01-29 DIAGNOSIS — I152 Hypertension secondary to endocrine disorders: Secondary | ICD-10-CM | POA: Diagnosis present

## 2018-01-29 DIAGNOSIS — IMO0002 Reserved for concepts with insufficient information to code with codable children: Secondary | ICD-10-CM

## 2018-01-29 DIAGNOSIS — E118 Type 2 diabetes mellitus with unspecified complications: Secondary | ICD-10-CM

## 2018-01-29 DIAGNOSIS — I429 Cardiomyopathy, unspecified: Secondary | ICD-10-CM | POA: Diagnosis not present

## 2018-01-29 DIAGNOSIS — E1165 Type 2 diabetes mellitus with hyperglycemia: Secondary | ICD-10-CM

## 2018-01-29 DIAGNOSIS — E109 Type 1 diabetes mellitus without complications: Secondary | ICD-10-CM

## 2018-01-29 DIAGNOSIS — R079 Chest pain, unspecified: Secondary | ICD-10-CM | POA: Diagnosis present

## 2018-01-29 DIAGNOSIS — R000 Tachycardia, unspecified: Secondary | ICD-10-CM | POA: Diagnosis present

## 2018-01-29 DIAGNOSIS — E1022 Type 1 diabetes mellitus with diabetic chronic kidney disease: Secondary | ICD-10-CM

## 2018-01-29 DIAGNOSIS — F329 Major depressive disorder, single episode, unspecified: Secondary | ICD-10-CM | POA: Insufficient documentation

## 2018-01-29 DIAGNOSIS — F419 Anxiety disorder, unspecified: Secondary | ICD-10-CM | POA: Diagnosis not present

## 2018-01-29 DIAGNOSIS — Z794 Long term (current) use of insulin: Secondary | ICD-10-CM | POA: Insufficient documentation

## 2018-01-29 DIAGNOSIS — R42 Dizziness and giddiness: Secondary | ICD-10-CM | POA: Diagnosis present

## 2018-01-29 DIAGNOSIS — I110 Hypertensive heart disease with heart failure: Secondary | ICD-10-CM | POA: Insufficient documentation

## 2018-01-29 DIAGNOSIS — E1010 Type 1 diabetes mellitus with ketoacidosis without coma: Secondary | ICD-10-CM | POA: Diagnosis not present

## 2018-01-29 DIAGNOSIS — I10 Essential (primary) hypertension: Secondary | ICD-10-CM | POA: Diagnosis present

## 2018-01-29 LAB — LIPASE, BLOOD: LIPASE: 23 U/L (ref 11–51)

## 2018-01-29 LAB — POCT I-STAT EG7
Acid-Base Excess: 1 mmol/L (ref 0.0–2.0)
BICARBONATE: 25.5 mmol/L (ref 20.0–28.0)
Calcium, Ion: 1.23 mmol/L (ref 1.15–1.40)
HCT: 47 % — ABNORMAL HIGH (ref 36.0–46.0)
Hemoglobin: 16 g/dL — ABNORMAL HIGH (ref 12.0–15.0)
O2 Saturation: 79 %
Potassium: 4 mmol/L (ref 3.5–5.1)
Sodium: 134 mmol/L — ABNORMAL LOW (ref 135–145)
TCO2: 27 mmol/L (ref 22–32)
pCO2, Ven: 40.9 mmHg — ABNORMAL LOW (ref 44.0–60.0)
pH, Ven: 7.403 (ref 7.250–7.430)
pO2, Ven: 43 mmHg (ref 32.0–45.0)

## 2018-01-29 LAB — CBC
HCT: 45.8 % (ref 36.0–46.0)
HEMOGLOBIN: 15.1 g/dL — AB (ref 12.0–15.0)
MCH: 28.3 pg (ref 26.0–34.0)
MCHC: 33 g/dL (ref 30.0–36.0)
MCV: 85.9 fL (ref 80.0–100.0)
Platelets: 252 10*3/uL (ref 150–400)
RBC: 5.33 MIL/uL — ABNORMAL HIGH (ref 3.87–5.11)
RDW: 14.8 % (ref 11.5–15.5)
WBC: 8.2 10*3/uL (ref 4.0–10.5)
nRBC: 0 % (ref 0.0–0.2)

## 2018-01-29 LAB — HEPATIC FUNCTION PANEL
ALT: 25 U/L (ref 0–44)
AST: 19 U/L (ref 15–41)
Albumin: 4 g/dL (ref 3.5–5.0)
Alkaline Phosphatase: 62 U/L (ref 38–126)
BILIRUBIN INDIRECT: 0.5 mg/dL (ref 0.3–0.9)
Bilirubin, Direct: 0.1 mg/dL (ref 0.0–0.2)
Total Bilirubin: 0.6 mg/dL (ref 0.3–1.2)
Total Protein: 7 g/dL (ref 6.5–8.1)

## 2018-01-29 LAB — I-STAT BETA HCG BLOOD, ED (MC, WL, AP ONLY): I-stat hCG, quantitative: <5 m[IU]/mL (ref <5)

## 2018-01-29 LAB — BASIC METABOLIC PANEL
ANION GAP: 14 (ref 5–15)
BUN: 9 mg/dL (ref 6–20)
CO2: 24 mmol/L (ref 22–32)
Calcium: 9.4 mg/dL (ref 8.9–10.3)
Chloride: 96 mmol/L — ABNORMAL LOW (ref 98–111)
Creatinine, Ser: 0.56 mg/dL (ref 0.44–1.00)
GFR calc Af Amer: >60 mL/min (ref >60)
GFR calc non Af Amer: >60 mL/min (ref >60)
Glucose, Bld: 317 mg/dL — ABNORMAL HIGH (ref 70–99)
POTASSIUM: 4.2 mmol/L (ref 3.5–5.1)
Sodium: 134 mmol/L — ABNORMAL LOW (ref 135–145)

## 2018-01-29 LAB — PROTIME-INR
INR: 0.91
Prothrombin Time: 12.2 seconds (ref 11.4–15.2)

## 2018-01-29 LAB — I-STAT TROPONIN, ED: Troponin i, poc: 0 ng/mL (ref 0.00–0.08)

## 2018-01-29 LAB — BRAIN NATRIURETIC PEPTIDE: B Natriuretic Peptide: 12.4 pg/mL (ref 0.0–100.0)

## 2018-01-29 MED ORDER — PROMETHAZINE HCL 25 MG/ML IJ SOLN
12.5000 mg | Freq: Once | INTRAMUSCULAR | Status: AC
  Administered 2018-01-29: 12.5 mg via INTRAVENOUS
  Filled 2018-01-29: qty 1

## 2018-01-29 MED ORDER — MORPHINE SULFATE (PF) 4 MG/ML IV SOLN
4.0000 mg | Freq: Once | INTRAVENOUS | Status: AC
  Administered 2018-01-29: 4 mg via INTRAVENOUS
  Filled 2018-01-29: qty 1

## 2018-01-29 MED ORDER — SODIUM CHLORIDE 0.9 % IV BOLUS
1000.0000 mL | Freq: Once | INTRAVENOUS | Status: AC
  Administered 2018-01-29: 1000 mL via INTRAVENOUS

## 2018-01-29 MED ORDER — SODIUM CHLORIDE 0.9% FLUSH
3.0000 mL | Freq: Once | INTRAVENOUS | Status: AC
  Administered 2018-01-29: 3 mL via INTRAVENOUS

## 2018-01-29 MED ORDER — INSULIN LISPRO 100 UNIT/ML ~~LOC~~ SOLN: 15.0000 [IU] | Freq: Two times a day (BID) | SUBCUTANEOUS | 99 refills | Status: DC

## 2018-01-29 NOTE — Telephone Encounter (Signed)
Spoke with patient regarding medication changes.  She is now aware that she is to only administer Lantus 60 units in the p.m. and Humalog 15 units twice daily with meals.  NovoLog order has been DC'd at this time.

## 2018-01-29 NOTE — ED Triage Notes (Signed)
Pt here from home with central chest pain and feeling like she may pass out.  Pt A&Ox4, ambulatory to triage.

## 2018-01-29 NOTE — Telephone Encounter (Signed)
Patient called because she says she has a lot of stomach pain and is throwing up a lot of blood. Please follow up.  Patient was advised to go to the ED or UC.

## 2018-01-29 NOTE — ED Provider Notes (Signed)
[facility] EMERGENCY DEPARTMENT Provider Note   CSN: [id number] Arrival date & time: 01/29/18  1731     History   Chief Complaint Chief Complaint  Patient presents with  . Chest Pain  . Near Syncope    HPI Nancy [name] is a 18 y.o. female.  The history is provided by the patient and medical records. No language interpreter was used.  Loss of Consciousness  Episode history:  Multiple Most recent episode:  Today Duration:  11 minutes Timing:  Intermittent Progression:  Waxing and waning Chronicity:  Recurrent Witnessed: yes   Relieved by:  Nothing Worsened by:  Nothing Ineffective treatments:  None tried Associated symptoms: chest pain, diaphoresis, malaise/fatigue, nausea, palpitations, shortness of breath and vomiting   Associated symptoms: no dizziness, no fever, no headaches, no recent fall, no recent surgery, no rectal bleeding and no seizures     Past Medical History:  Diagnosis Date  . Acanthosis nigricans   . Anxiety   . CHF (congestive heart failure) ([place])   . Chronic lower back pain   . Depression   . Dyspepsia   . Obesity   . Ovarian cyst    pt is not aware of this hx (11/24/2017)  . Pre-diabetes   . 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 Premature baby   . Type II diabetes mellitus ([facility])    insulin dependant    Patient Active Problem List   Diagnosis Date Noted  . Orthostatic hypotension 01/24/2018  . DKA (diabetic ketoacidosis) ([place]) 01/24/2018  . Chronic abdominal pain 12/24/2017  . Sinus tachycardia by electrocardiogram   . Acute lower UTI   . Uncontrolled type 2 diabetes mellitus with hyperglycemia ([place])   . Chest pain 12/19/2017  . Generalized abdominal pain 08/21/2017  . Non compliance with medical treatment 01/27/2012  . Adjustment disorder 09/16/2011  . Dyspepsia   . Acanthosis nigricans   . Goiter   . Obesity 06/14/2010  . Hypertension 06/14/2010    Past Surgical History:  Procedure Laterality Date  .  ABDOMINAL HERNIA REPAIR     "I was a baby"  . HERNIA REPAIR    . TONSILLECTOMY AND ADENOIDECTOMY    . WISDOM TOOTH EXTRACTION  2017     OB History   No obstetric history on file.      Home Medications    Prior to Admission medications   Medication Sig Start Date End Date Taking? Authorizing Provider  alum & mag hydroxide-simeth (MAALOX/MYLANTA) 200-200-20 MG/5ML suspension Take 30 mLs by mouth every 4 (four) hours as needed for indigestion or heartburn. 01/26/18   Aline August, MD  Blood Glucose Monitoring Suppl (TRUE METRIX METER) w/Device KIT Use as instructed. Monitor blood glucose levels twice per day 12/02/17   [name], NP  diclofenac sodium (VOLTAREN) 1 % GEL Apply 2 g topically 4 (four) times daily. Patient taking differently: Apply 2 g topically See admin instructions. Apply 2 grams to painful areas of chest and/or stomach four times a day 12/20/17   Debbe Odea, MD  glucagon 1 MG injection Use for Severe Hypoglycemia . Inject 1 mg intramuscularly if unresponsive, unable to swallow, unconscious and/or has seizure Patient taking differently: Inject 1 mg into the muscle once as needed (for severe hypoglycemia- if unresponsive, unable to swallow, unconscious and/or has seizure).  07/20/13 12/19/25  Renato Shin, MD  glucose blood (TRUE METRIX BLOOD GLUCOSE TEST) test strip Use as instructed 12/02/17   [name], NP  Insulin Glargine (LANTUS)  100 UNIT/ML Solostar Pen Inject 60 Units into the skin at bedtime. 01/26/18   Aline August, MD  insulin lispro (HUMALOG) 100 UNIT/ML injection Inject 0.15 mLs (15 Units total) into the skin 2 (two) times daily with a meal for 30 days. 01/29/18 02/28/18  [name], NP  Insulin Pen Needle (B-D UF III MINI PEN NEEDLES) 31G X 5 MM MISC Use as instructed. Monitor blood glucose levels twice per day 12/02/17   [name], NP  metoprolol tartrate (LOPRESSOR) 25 MG tablet Take 25 mg by mouth 2 (two) times daily.    [provider]  ondansetron (ZOFRAN) 4 MG tablet Take 1 tablet (4 mg total) by mouth every 8 (eight) hours as needed for nausea or vomiting. 01/26/18 01/26/19  Aline August, MD  pantoprazole (PROTONIX) 40 MG tablet Take 1 tablet (40 mg total) by mouth daily. 01/27/18   Aline August, MD  QUEtiapine (SEROQUEL) 100 MG tablet Take 300 mg by mouth at bedtime.     [provider]  traMADol (ULTRAM) 50 MG tablet Take 1 tablet (50 mg total) by mouth every 6 (six) hours as needed for moderate pain. 01/26/18   Aline August, MD  TRUEPLUS LANCETS 28G MISC Use as instructed. Monitor blood glucose levels twice per day 12/02/17   [name], NP    Family History Family History  Problem Relation Age of Onset  . Diabetes Mother   . Hypertension Mother   . Obesity Mother   . Asthma Mother   . Allergic rhinitis Mother   . Eczema Mother   . Diabetes Father   . Hypertension Father   . Obesity Father   . Hyperlipidemia Father   . Hypertension Paternal Aunt   . Hypertension Maternal Grandfather   . Colon cancer Maternal Grandfather   . Diabetes Paternal Grandmother   . Obesity Paternal Grandmother   . Diabetes Paternal Grandfather   . Obesity Paternal Grandfather   . Angioedema Neg Hx   . Immunodeficiency Neg Hx   . Urticaria Neg Hx   . Stomach cancer Neg Hx   . Esophageal cancer Neg Hx     Social History Social History   Tobacco Use  . Smoking status: Never Smoker  . Smokeless tobacco: Never Used  Substance Use Topics  . Alcohol use: No    Alcohol/week: 0.0 standard drinks  . Drug use: No     Allergies   Ibuprofen   Review of Systems Review of Systems  Constitutional: Positive for chills, diaphoresis, fatigue and malaise/fatigue. Negative for fever.  HENT: Negative for congestion.   Eyes: Negative for visual disturbance.  Respiratory: Positive for chest tightness and shortness of breath. Negative for wheezing and stridor.   Cardiovascular: Positive for chest pain,  palpitations and syncope. Negative for leg swelling.  Gastrointestinal: Positive for diarrhea, nausea and vomiting. Negative for abdominal pain and constipation.  Genitourinary: Negative for dysuria.  Musculoskeletal: Negative for back pain, neck pain and neck stiffness.  Skin: Negative for rash and wound.  Neurological: Positive for syncope and light-headedness. Negative for dizziness, seizures and headaches.  All other systems reviewed and are negative.    Physical Exam Updated Vital Signs BP 129/81   Pulse (!) 133   Temp 98.4 F (36.9 C)   Resp 16   LMP 01/13/2018   SpO2 100%   Physical Exam Vitals signs and nursing note reviewed.  Constitutional:      General: She is not in acute distress.  Appearance: She is well-developed. She is not ill-appearing, toxic-appearing or diaphoretic.  HENT:     Head: Normocephalic and atraumatic.  Eyes:     Conjunctiva/sclera: Conjunctivae normal.     Pupils: Pupils are equal, round, and reactive to light.  Neck:     Musculoskeletal: Neck supple.  Cardiovascular:     Rate and Rhythm: Regular rhythm. Tachycardia present.     Heart sounds: No murmur.  Pulmonary:     Effort: Pulmonary effort is normal. No respiratory distress.     Breath sounds: Normal breath sounds. No wheezing, rhonchi or rales.  Chest:     Chest wall: Tenderness present.  Abdominal:     Palpations: Abdomen is soft.     Tenderness: There is abdominal tenderness. There is no guarding or rebound.  Musculoskeletal:     Right lower leg: She exhibits no tenderness. No edema.     Left lower leg: She exhibits no tenderness. No edema.  Skin:    General: Skin is warm and dry.     Capillary Refill: Capillary refill takes less than 2 seconds.  Neurological:     General: No focal deficit present.     Mental Status: She is alert.  Psychiatric:        Mood and Affect: Mood normal.      ED Treatments / Results  Labs (all labs ordered are listed, but only abnormal  results are displayed) Labs Reviewed  BASIC METABOLIC PANEL - Abnormal; Notable for the following components:      Result Value   Sodium 134 (*)    Chloride 96 (*)    Glucose, Bld 317 (*)    All other components within normal limits  CBC - Abnormal; Notable for the following components:   RBC 5.33 (*)    Hemoglobin 15.1 (*)    All other components within normal limits  POCT I-STAT EG7 - Abnormal; Notable for the following components:   pCO2, Ven 40.9 (*)    Sodium 134 (*)    HCT 47.0 (*)    Hemoglobin 16.0 (*)    All other components within normal limits  HEPATIC FUNCTION PANEL  LIPASE, BLOOD  PROTIME-INR  BRAIN NATRIURETIC PEPTIDE  I-STAT TROPONIN, ED  I-STAT BETA HCG BLOOD, ED ([facility], [facility], [facility] ONLY)  I-STAT VENOUS BLOOD GAS, ED    EKG EKG Interpretation  Date/Time:  Friday January 29 2018 17:43:38 EST Ventricular Rate:  137 PR Interval:  116 QRS Duration: 78 QT Interval:  306 QTC Calculation: 462 R Axis:   98 Text Interpretation:  Sinus tachycardia Right atrial enlargement Rightward axis Borderline ECG When compared to priorn similar tachycardia with t wave now upright in AVF.  no STEMI Confirmed by Antony Blackbird [phone]) on 01/29/2018 8:28:34 PM   Radiology Dg Chest 2 View  Result Date: 01/29/2018 CLINICAL DATA:  Chest pain and short of breath EXAM: CHEST - 2 VIEW COMPARISON:  01/16/2018 FINDINGS: The heart size and mediastinal contours are within normal limits. Both lungs are clear. The visualized skeletal structures are unremarkable. IMPRESSION: No active cardiopulmonary disease. Electronically Signed   By: Donavan Foil M.D.   On: 01/29/2018 18:51    Procedures Procedures (including critical care time)  Medications Ordered in ED Medications  sodium chloride 0.9 % bolus 1,000 mL (1,000 mLs Intravenous New Bag/Given 01/29/18 2208)  morphine 4 MG/ML injection 4 mg (has no administration in time range)  sodium chloride 0.9 % bolus 1,000 mL (has no administration in  time range)  sodium chloride flush (NS) 0.9 % injection 3 mL (3 mLs Intravenous Given 01/29/18 2208)  promethazine (PHENERGAN) injection 12.5 mg (12.5 mg Intravenous Given 01/29/18 2207)  morphine 4 MG/ML injection 4 mg (4 mg Intravenous Given 01/29/18 2207)     Initial Impression / Assessment and Plan / ED Course  I have reviewed the triage vital signs and the nursing notes.  Pertinent labs & imaging results that were available during my care of the patient were reviewed by me and considered in my medical decision making (see chart for details).     Nancy [name] is a 18 y.o. female with a past medical history significant for diabetes with prior DKA, hypertension, obesity, CHF, and recent admission for chest pain, syncope, and DKA who presents with recurrent chest pain, nausea, vomiting, diarrhea, numerous syncopal episodes, and hematemesis.  Patient reports that she was discharged 3 days ago from the hospital and says that her symptoms have continued to worsen since leaving.  She reports that she passed out greater than 10 times a day with the longest episode being out for 11 minutes.  She reports that she is been vomiting blood since yesterday that is bright blood.  She reports she has had some diarrhea and she is also had significant chest pain and palpitations.  She reports her chest pain is severe and 10 out of 10.  She reports that the exact same as it was when she left the hospital.  She reports that she has scheduled appointments with both cardiology and GI and since her symptoms continue to worsen she did not feel safe at home.   On arrival, EKG showed heart rate in the 130s.  EKG shows similar sinus tachycardia.  No STEMI.  Patient is still feeling lightheaded and having chest pain.  On exam, chest is slightly tender.  Lungs were clear.  No significant murmur appreciated.  Abdomen nontender.  Legs had no significant edema.  Patient had palpable pulses in extremities.  Clinically I am  concerned about the patient's report that she is having continued chest pain, palpitations, fast heart rate, nausea, vomiting, diarrhea, hematemesis, and the numerous syncopal episodes.  Patient will have screening laboratory testing and imaging however anticipate she will require readmission.  10:52 PM Patient diagnostic work-up began to return.  Patient's pH was normal, no evidence of DKA.  Hepatic function normal.  Troponin negative.  Lipase not elevated.  Patient is not pregnant.  BNP not elevated.  INR normal.  CBC shows no leukocytosis and patient is not anemic.  Patient is hyperglycemic.  Chest x-ray shows no acute cardiopulmonary disease.  Unclear etiology of the patient's tachycardia, chest pain, syncopal episodes, and presents for nausea and vomiting.  Patient was feeling somewhat better after medications however she is still feeling lightheaded and is still tachycardic.  Given her history of heart failure and her recent discharge several days ago with worsening symptoms, do not feel safe sending patient home.  Patient will be readmitted.   Final Clinical Impressions(s) / ED Diagnoses   Final diagnoses:  Syncope, unspecified syncope type  Tachycardia  Chest pain, unspecified type  Intractable vomiting with nausea, unspecified vomiting type    ED Discharge Orders    None     Clinical Impression: 1. Syncope, unspecified syncope type   2. Tachycardia   3. Chest pain, unspecified type   4. Intractable vomiting with nausea, unspecified vomiting type     Disposition: Admit  This note was prepared with assistance of  [employer] and education officer. Occasional wrong-word or sound-a-like substitutions may have occurred due to the inherent limitations of voice recognition software.     , Gwenyth Allegra, MD 01/29/18 2312

## 2018-01-29 NOTE — Telephone Encounter (Signed)
Pt name and DOB verified. She states she has N/V of blood, about 6 ounces. With emesis.  Sx's has been going on for 4 days: cx pain, hematemesis. Unable to keep fluids down. Denies gastroparesis.   GI appointment canceled on 01/28/2018 rescheduled on 02/01/2018 Has appointment with [facility] on 02/01/2018 Pt advised to go to ED if sx's worsen. Pt verbalized understanding.

## 2018-01-30 ENCOUNTER — Encounter (HOSPITAL_COMMUNITY): Payer: Self-pay | Admitting: Internal Medicine

## 2018-01-30 DIAGNOSIS — R55 Syncope and collapse: Secondary | ICD-10-CM | POA: Diagnosis not present

## 2018-01-30 DIAGNOSIS — R079 Chest pain, unspecified: Secondary | ICD-10-CM | POA: Diagnosis not present

## 2018-01-30 DIAGNOSIS — E1165 Type 2 diabetes mellitus with hyperglycemia: Secondary | ICD-10-CM

## 2018-01-30 LAB — COMPREHENSIVE METABOLIC PANEL
ALT: 24 U/L (ref 0–44)
AST: 15 U/L (ref 15–41)
Albumin: 3.6 g/dL (ref 3.5–5.0)
Alkaline Phosphatase: 56 U/L (ref 38–126)
Anion gap: 10 (ref 5–15)
BILIRUBIN TOTAL: 0.9 mg/dL (ref 0.3–1.2)
BUN: 11 mg/dL (ref 6–20)
CO2: 21 mmol/L — ABNORMAL LOW (ref 22–32)
Calcium: 8.6 mg/dL — ABNORMAL LOW (ref 8.9–10.3)
Chloride: 106 mmol/L (ref 98–111)
Creatinine, Ser: 0.64 mg/dL (ref 0.44–1.00)
GFR calc Af Amer: >60 mL/min (ref >60)
GFR calc non Af Amer: >60 mL/min (ref >60)
Glucose, Bld: 298 mg/dL — ABNORMAL HIGH (ref 70–99)
Potassium: 3.8 mmol/L (ref 3.5–5.1)
Sodium: 137 mmol/L (ref 135–145)
Total Protein: 6.3 g/dL — ABNORMAL LOW (ref 6.5–8.1)

## 2018-01-30 LAB — CBG MONITORING, ED
Glucose-Capillary: 247 mg/dL — ABNORMAL HIGH (ref 70–99)
Glucose-Capillary: 150 mg/dL — ABNORMAL HIGH (ref 70–99)

## 2018-01-30 LAB — C-REACTIVE PROTEIN: CRP: <0.8 mg/dL (ref <1.0)

## 2018-01-30 LAB — CBC WITH DIFFERENTIAL/PLATELET
Abs Immature Granulocytes: 0.02 10*3/uL (ref 0.00–0.07)
Basophils Absolute: 0 10*3/uL (ref 0.0–0.1)
Basophils Relative: 1 %
Eosinophils Absolute: 0.1 10*3/uL (ref 0.0–0.5)
Eosinophils Relative: 1 %
HCT: 41.8 % (ref 36.0–46.0)
Hemoglobin: 13.2 g/dL (ref 12.0–15.0)
Immature Granulocytes: 0 %
Lymphocytes Relative: 47 %
Lymphs Abs: 3.8 10*3/uL (ref 0.7–4.0)
MCH: 27.6 pg (ref 26.0–34.0)
MCHC: 31.6 g/dL (ref 30.0–36.0)
MCV: 87.4 fL (ref 80.0–100.0)
MONOS PCT: 13 %
Monocytes Absolute: 1.1 10*3/uL — ABNORMAL HIGH (ref 0.1–1.0)
Neutro Abs: 3.2 10*3/uL (ref 1.7–7.7)
Neutrophils Relative %: 38 %
Platelets: 228 10*3/uL (ref 150–400)
RBC: 4.78 MIL/uL (ref 3.87–5.11)
RDW: 15 % (ref 11.5–15.5)
WBC: 8.2 10*3/uL (ref 4.0–10.5)
nRBC: 0 % (ref 0.0–0.2)

## 2018-01-30 LAB — GLUCOSE, CAPILLARY
Glucose-Capillary: 143 mg/dL — ABNORMAL HIGH (ref 70–99)
Glucose-Capillary: 191 mg/dL — ABNORMAL HIGH (ref 70–99)

## 2018-01-30 LAB — TSH: TSH: 4.053 u[IU]/mL (ref 0.350–4.500)

## 2018-01-30 LAB — TROPONIN I
Troponin I: <0.03 ng/mL (ref <0.03)
Troponin I: <0.03 ng/mL (ref <0.03)
Troponin I: <0.03 ng/mL (ref <0.03)

## 2018-01-30 LAB — RAPID URINE DRUG SCREEN, HOSP PERFORMED
Amphetamines: NOT DETECTED
BENZODIAZEPINES: NOT DETECTED
Barbiturates: NOT DETECTED
Cocaine: NOT DETECTED
Opiates: POSITIVE — AB
Tetrahydrocannabinol: NOT DETECTED

## 2018-01-30 LAB — SEDIMENTATION RATE: Sed Rate: 1 mm/hr (ref 0–22)

## 2018-01-30 MED ORDER — ACETAMINOPHEN 650 MG RE SUPP: 650.0000 mg | Freq: Four times a day (QID) | RECTAL | Status: DC | PRN

## 2018-01-30 MED ORDER — ONDANSETRON HCL 4 MG/2ML IJ SOLN: 4.0000 mg | Freq: Four times a day (QID) | INTRAMUSCULAR | Status: DC | PRN

## 2018-01-30 MED ORDER — METOPROLOL TARTRATE 25 MG PO TABS
25.0000 mg | ORAL_TABLET | Freq: Two times a day (BID) | ORAL | Status: DC
  Administered 2018-01-30 – 2018-01-31 (×2): 25 mg via ORAL
  Filled 2018-01-30 (×3): qty 1

## 2018-01-30 MED ORDER — ACETAMINOPHEN 325 MG PO TABS
650.0000 mg | ORAL_TABLET | Freq: Four times a day (QID) | ORAL | Status: DC | PRN
  Administered 2018-01-30 (×3): 650 mg via ORAL
  Filled 2018-01-30 (×2): qty 2

## 2018-01-30 MED ORDER — INSULIN GLARGINE 100 UNIT/ML ~~LOC~~ SOLN
60.0000 [IU] | Freq: Every day | SUBCUTANEOUS | Status: DC
  Administered 2018-01-30: 60 [IU] via SUBCUTANEOUS
  Filled 2018-01-30: qty 0.6

## 2018-01-30 MED ORDER — QUETIAPINE FUMARATE 50 MG PO TABS
300.0000 mg | ORAL_TABLET | Freq: Every day | ORAL | Status: DC
  Administered 2018-01-30 – 2018-01-31 (×2): 300 mg via ORAL
  Filled 2018-01-30 (×2): qty 6

## 2018-01-30 MED ORDER — ESCITALOPRAM OXALATE 20 MG PO TABS
20.0000 mg | ORAL_TABLET | Freq: Every day | ORAL | Status: DC
  Administered 2018-01-30 – 2018-02-01 (×3): 20 mg via ORAL
  Filled 2018-01-30: qty 2
  Filled 2018-01-30 (×2): qty 1

## 2018-01-30 MED ORDER — INSULIN ASPART 100 UNIT/ML ~~LOC~~ SOLN
0.0000 [IU] | Freq: Three times a day (TID) | SUBCUTANEOUS | Status: DC
  Administered 2018-01-30 (×2): 1 [IU] via SUBCUTANEOUS
  Administered 2018-01-30: 3 [IU] via SUBCUTANEOUS
  Administered 2018-01-31 (×2): 2 [IU] via SUBCUTANEOUS
  Administered 2018-01-31 – 2018-02-01 (×2): 1 [IU] via SUBCUTANEOUS

## 2018-01-30 MED ORDER — HYDROMORPHONE HCL 1 MG/ML IJ SOLN
0.5000 mg | Freq: Once | INTRAMUSCULAR | Status: AC
  Administered 2018-01-30: 0.5 mg via INTRAVENOUS
  Filled 2018-01-30: qty 1

## 2018-01-30 MED ORDER — ONDANSETRON HCL 4 MG PO TABS: 4.0000 mg | ORAL_TABLET | Freq: Four times a day (QID) | ORAL | Status: DC | PRN

## 2018-01-30 MED ORDER — DICYCLOMINE HCL 10 MG PO CAPS
10.0000 mg | ORAL_CAPSULE | Freq: Three times a day (TID) | ORAL | Status: DC | PRN
  Administered 2018-01-30: 10 mg via ORAL
  Filled 2018-01-30: qty 1

## 2018-01-30 MED ORDER — INSULIN ASPART 100 UNIT/ML ~~LOC~~ SOLN
15.0000 [IU] | Freq: Two times a day (BID) | SUBCUTANEOUS | Status: DC
  Administered 2018-01-30 – 2018-02-01 (×5): 15 [IU] via SUBCUTANEOUS

## 2018-01-30 MED ORDER — PANTOPRAZOLE SODIUM 40 MG PO TBEC
40.0000 mg | DELAYED_RELEASE_TABLET | Freq: Every day | ORAL | Status: DC
  Administered 2018-01-30 – 2018-01-31 (×2): 40 mg via ORAL
  Filled 2018-01-30 (×3): qty 1

## 2018-01-30 NOTE — ED Notes (Signed)
Admitting MD ( Dr,. Kakrakandy ) notified on pt.'s persistent chest pain.

## 2018-01-30 NOTE — H&P (Signed)
History and Physical    Nancy [name] JOI:[id number] DOB: [birth year] DOA: 01/29/2018  PCP: [name], NP  Patient coming from: Home.  Chief Complaint: Loss of consciousness and chest pain.  HPI: Nancy [name] is a 18 y.o. female with history of diabetes mellitus type 2 recently admitted for DKA and recurrent syncope with history of cardiomyopathy last EF measured was 45% presents to the ER because of recurrent syncopal episode over the last 24 hours.  Patient states she has had at least 10 episodes in the last 24 hours and was witnessed by her family.  Patient stated the longest she had was around 10 minutes but did not have any seizure-like activities or any hypoglycemic episodes.  Did not have any tongue bite or incontinence of urine.  Some of the episodes were preceded by some dizziness but some of them were just spontaneous without any prodrome.  Patient did not have any loss of function of upper or lower extremity.  Same time patient also has been a persistent chest pain which is chronically present.  Patient states he has been compliant with her medications.  During the recent admission patient amlodipine was discontinued.  ED Course: In the ER patient was tachycardic with heart rate in the 130s.  Urine drug screen is positive for benzodiazepine.  Troponin was negative BNP was 12.4.  Chest x-ray unremarkable.  Given the recurrent syncopal episodes ongoing chest pain with tachycardia patient admitted for further observation.  Recent thyroid function test were within acceptable limits.  Patient also had recent CT angiogram of the chest on December 14, 2017 which was negative for PE.  Patient had CT coronary study on January 14 2018 showed a calcium score of 0.  Heart rate improved with fluids.  Patient admitted for further observation for syncope.  Review of Systems: As per HPI, rest all negative.   Past Medical History:  Diagnosis Date  . Acanthosis nigricans   . Anxiety   . CHF  (congestive heart failure) ([place])   . Chronic lower back pain   . Depression   . Dyspepsia   . Obesity   . Ovarian cyst    pt is not aware of this hx (11/24/2017)  . Pre-diabetes   . 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 Premature baby   . Type II diabetes mellitus (HCC)    insulin dependant    Past Surgical History:  Procedure Laterality Date  . ABDOMINAL HERNIA REPAIR     "I was a baby"  . HERNIA REPAIR    . TONSILLECTOMY AND ADENOIDECTOMY    . WISDOM TOOTH EXTRACTION  2017     reports that she has never smoked. She has never used smokeless tobacco. She reports that she does not drink alcohol or use drugs.  Allergies  Allergen Reactions  . Ibuprofen Other (See Comments)    GI MD said to not take this anymore    Family History  Problem Relation Age of Onset  . Diabetes Mother   . Hypertension Mother   . Obesity Mother   . Asthma Mother   . Allergic rhinitis Mother   . Eczema Mother   . Diabetes Father   . Hypertension Father   . Obesity Father   . Hyperlipidemia Father   . Hypertension Paternal Aunt   . Hypertension Maternal Grandfather   . Colon cancer Maternal Grandfather   . Diabetes Paternal Grandmother   . Obesity Paternal Grandmother   . Diabetes Paternal Grandfather   .  Obesity Paternal Grandfather   . Angioedema Neg Hx   . Immunodeficiency Neg Hx   . Urticaria Neg Hx   . Stomach cancer Neg Hx   . Esophageal cancer Neg Hx     Prior to Admission medications   Medication Sig Start Date End Date Taking? Authorizing Provider  alum & mag hydroxide-simeth (MAALOX/MYLANTA) 200-200-20 MG/5ML suspension Take 30 mLs by mouth every 4 (four) hours as needed for indigestion or heartburn. 01/26/18  Yes Aline August, MD  diclofenac sodium (VOLTAREN) 1 % GEL Apply 2 g topically 4 (four) times daily. Patient taking differently: Apply 2 g topically 4 (four) times daily. to painful areas of chest and/or stomach 12/20/17  Yes Rizwan, Eunice Blase, MD  escitalopram (LEXAPRO) 10 MG  tablet Take 20 mg by mouth daily.   Yes [provider]  glucagon 1 MG injection Use for Severe Hypoglycemia . Inject 1 mg intramuscularly if unresponsive, unable to swallow, unconscious and/or has seizure Patient taking differently: Inject 1 mg into the muscle once as needed (for severe hypoglycemia- if unresponsive, unable to swallow, unconscious and/or has seizure).  07/20/13 12/19/25 Yes Renato Shin, MD  Insulin Glargine (LANTUS) 100 UNIT/ML Solostar Pen Inject 60 Units into the skin at bedtime. 01/26/18  Yes Aline August, MD  insulin lispro (HUMALOG) 100 UNIT/ML injection Inject 0.15 mLs (15 Units total) into the skin 2 (two) times daily with a meal for 30 days. 01/29/18 02/28/18 Yes [name], NP  metoprolol tartrate (LOPRESSOR) 25 MG tablet Take 25 mg by mouth 2 (two) times daily.   Yes [provider]  ondansetron (ZOFRAN) 4 MG tablet Take 1 tablet (4 mg total) by mouth every 8 (eight) hours as needed for nausea or vomiting. 01/26/18 01/26/19 Yes Aline August, MD  pantoprazole (PROTONIX) 40 MG tablet Take 1 tablet (40 mg total) by mouth daily. 01/27/18  Yes Aline August, MD  QUEtiapine (SEROQUEL) 100 MG tablet Take 300 mg by mouth at bedtime.    Yes [provider]  traMADol (ULTRAM) 50 MG tablet Take 1 tablet (50 mg total) by mouth every 6 (six) hours as needed for moderate pain. 01/26/18  Yes Aline August, MD  Blood Glucose Monitoring Suppl (TRUE METRIX METER) w/Device KIT Use as instructed. Monitor blood glucose levels twice per day 12/02/17   [name], NP  glucose blood (TRUE METRIX BLOOD GLUCOSE TEST) test strip Use as instructed 12/02/17   [name], NP  Insulin Pen Needle (B-D UF III MINI PEN NEEDLES) 31G X 5 MM MISC Use as instructed. Monitor blood glucose levels twice per day 12/02/17   [name], NP  TRUEPLUS LANCETS 28G MISC Use as instructed. Monitor blood glucose levels twice per day 12/02/17   [name], NP     Physical Exam: Vitals:   01/30/18 0013 01/30/18 0030 01/30/18 0100 01/30/18 0130  BP: 123/75 117/77 119/81 113/74  Pulse: (!) 102 (!) 108 (!) 104 (!) 103  Resp: 16 (!) 24 (!) 27 (!) 23  Temp:      SpO2: 100% 100% 100% 100%      Constitutional: Moderately built and nourished. Vitals:   01/30/18 0013 01/30/18 0030 01/30/18 0100 01/30/18 0130  BP: 123/75 117/77 119/81 113/74  Pulse: (!) 102 (!) 108 (!) 104 (!) 103  Resp: 16 (!) 24 (!) 27 (!) 23  Temp:      SpO2: 100% 100% 100% 100%   Eyes: Anicteric no pallor. ENMT: No discharge from the ears eyes nose  and mouth. Neck: No JVD appreciated no mass felt. Respiratory: No rhonchi or crepitations. Cardiovascular: S1-S2 heard.  Tachycardic. Abdomen: Soft nontender bowel sounds present. Musculoskeletal: No edema.  No joint effusion. Skin: No rash. Neurologic: Alert awake oriented to time place and person.  Moves all extremities. Psychiatric: Appears normal per normal affect.   Labs on Admission: I have personally reviewed following labs and imaging studies  CBC: Recent Labs  Lab 01/24/18 1313 01/29/18 1740 01/29/18 2215  WBC 6.4 8.2  --   NEUTROABS 4.0  --   --   HGB 14.8 15.1* 16.0*  HCT 45.3 45.8 47.0*  MCV 86.1 85.9  --   PLT 199 252  --    Basic Metabolic Panel: Recent Labs  Lab 01/24/18 1313 01/24/18 1600 01/24/18 2007 01/25/18 0114 01/26/18 0327 01/29/18 1740 01/29/18 2215  NA 132* 136 131* 138 137 134* 134*  K 4.0 4.3 3.7 3.5 3.9 4.2 4.0  CL 96* 99 96* 107 103 96*  --   CO2 18* 25 24 23 27 24   --   GLUCOSE 563* 358* 262* 269* 276* 317*  --   BUN 8 7 8 11 13 9   --   CREATININE 0.68 0.54 0.51 0.98 0.65 0.56  --   CALCIUM 9.4 9.4 8.5* 8.0* 8.5* 9.4  --   MG 2.1  --   --   --   --   --   --    GFR: Estimated Creatinine Clearance: 115.7 mL/min (by C-G formula based on SCr of 0.56 mg/dL). Liver Function Tests: Recent Labs  Lab 01/24/18 1313 01/26/18 0327 01/29/18 2135  AST 14* 13* 19  ALT 23 16  25   ALKPHOS 81 51 62  BILITOT 0.8 0.3 0.6  PROT 7.1 5.1* 7.0  ALBUMIN 3.9 2.9* 4.0   Recent Labs  Lab 01/26/18 0327 01/29/18 2135  LIPASE 25 23   No results for input(s): AMMONIA in the last 168 hours. Coagulation Profile: Recent Labs  Lab 01/29/18 2135  INR 0.91   Cardiac Enzymes: Recent Labs  Lab 01/24/18 2007 01/25/18 0114 01/25/18 0520  TROPONINI <0.03 <0.03 <0.03   BNP (last 3 results) No results for input(s): PROBNP in the last 8760 hours. HbA1C: No results for input(s): HGBA1C in the last 72 hours. CBG: Recent Labs  Lab 01/25/18 1329 01/25/18 1730 01/25/18 2055 01/26/18 0848 01/26/18 1235  GLUCAP 215* 334* 193* 270* 314*   Lipid Profile: No results for input(s): CHOL, HDL, LDLCALC, TRIG, CHOLHDL, LDLDIRECT in the last 72 hours. Thyroid Function Tests: No results for input(s): TSH, T4TOTAL, FREET4, T3FREE, THYROIDAB in the last 72 hours. Anemia Panel: No results for input(s): VITAMINB12, FOLATE, FERRITIN, TIBC, IRON, RETICCTPCT in the last 72 hours. Urine analysis:    Component Value Date/Time   COLORURINE STRAW (A) 01/24/2018 1313   APPEARANCEUR HAZY (A) 01/24/2018 1313   LABSPEC 1.036 (H) 01/24/2018 1313   PHURINE 5.0 01/24/2018 1313   GLUCOSEU >=500 (A) 01/24/2018 1313   HGBUR NEGATIVE 01/24/2018 1313   BILIRUBINUR NEGATIVE 01/24/2018 1313   BILIRUBINUR negative 12/28/2017 1612   KETONESUR 5 (A) 01/24/2018 1313   PROTEINUR NEGATIVE 01/24/2018 1313   UROBILINOGEN 1.0 12/28/2017 1612   UROBILINOGEN 1.0 07/09/2017 1324   NITRITE NEGATIVE 01/24/2018 1313   LEUKOCYTESUR NEGATIVE 01/24/2018 1313   Sepsis Labs: @LABRCNTIP (procalcitonin:4,lacticidven:4) ) Recent Results (from the past 240 hour(s))  MRSA PCR Screening     Status: None   Collection Time: 01/24/18  6:40 PM  Result Value Ref Range  Status   MRSA by PCR NEGATIVE NEGATIVE Final    Comment:        The GeneXpert MRSA Assay (FDA approved for NASAL specimens only), is one component of  a comprehensive MRSA colonization surveillance program. It is not intended to diagnose MRSA infection nor to guide or monitor treatment for MRSA infections. Performed at [facility], [place] [address], [place], [place] [address]      Radiological Exams on Admission: Dg Chest 2 View  Result Date: 01/29/2018 CLINICAL DATA:  Chest pain and short of breath EXAM: CHEST - 2 VIEW COMPARISON:  01/16/2018 FINDINGS: The heart size and mediastinal contours are within normal limits. Both lungs are clear. The visualized skeletal structures are unremarkable. IMPRESSION: No active cardiopulmonary disease. Electronically Signed   By: Donavan Foil M.D.   On: 01/29/2018 18:51    EKG: Independently reviewed.  Sinus tachycardia with a QTC of 462 ms.  Assessment/Plan Principal Problem:   Chest pain Active Problems:   Hypertension   Sinus tachycardia by electrocardiogram   Uncontrolled type 2 diabetes mellitus with hyperglycemia (HCC)   Syncope    1. Recurrent syncopal episodes -has had been increasing episodes now.  EKG shows QTC of 462 ms.  During recent admission no orthostatic changes were found.  Limited 2D echo done on January 25, 2018 showed EF of 45 to 50%.  We will continue to monitor in telemetry for now and see if there is any further episodes.  May discuss with cardiologist to see if patient would need Holter monitoring or otherwise.  CT angiogram of the chest in December 2019 last month was negative for PE. 2. Chest pain appears atypical and is reproducible on palpation.  Coronary calcium scoring done on January 14, 2018 was 0.  Troponins are negative. 3. Sinus tachycardia appears to have improved with fluids.  Continue metoprolol. 4. Hypertension on metoprolol.  Amlodipine was recently discontinued. 5. Cardiomyopathy last EF was 45 to 50% appears compensated. 6. Diabetes mellitus type 2 on insulin recently admitted for DKA.  Blood sugars in the 300s has not taken her last night dose of  Lantus which I have ordered now.  Closely follow CBGs with sliding scale coverage.  Follow metabolic panel. 7. Depression on Seroquel.   DVT prophylaxis: Lovenox. Code Status: Full code. Family Communication: Discussed with patient. Disposition Plan: Home. Consults called: None. Admission status: Observation.   Rise Patience MD [facility] Pager [phone].  If 7PM-7AM, please contact night-coverage [email or web address] Password [facility]  01/30/2018, 3:49 AM

## 2018-01-30 NOTE — Progress Notes (Signed)
See H&P from this morning for full details.  Essentially this is a 18 year old female with history of diabetes mellitus, cardiomyopathy with EF of 45% who seems to have had recurrent hospitalizations for DKA/syncope work-up.  She has also had several visits to the ED with complaints of syncope and extensive work-up has been done with cardiology evaluation during repeated admissions.  She has also signed out AMA few times.  She presents now again with complaints of recurrent syncope x10 episodes yesterday.  When seen by me in the ED patient's room was being changed and she literally walked with me at least 30 feet with no dizziness or chest pain or shortness of breath.  Her orthostatic blood pressures have been checked in previous admissions and have been repeatedly negative. On further probing she reports she started having syncopal episodes a year and half back.  Initially associated with drooling/jerky movements in extremities witnessed by her mother.  Apparently this was evaluated by PCP and recommended no work-up or treatment.  She denies childhood history of seizures or seeing a neurologist.  Per recent discharge summaries and ED notes, patient presents with a evaluation for syncope and also requests IV morphine or ketamine for abdominal pain during her stay.  She has had extensive work-up including negative CT abdomen in December 2019.  Shortly after my evaluation and discussion regarding observation/downgrading to MedSurg bed, I was paged by nurse to inform that patient now complaining of abdominal pain and requesting IV morphine.  A/p: Given extensive radiological and cardiology work-up in the recent past with no obvious cause of syncope identified, will defer further imaging studies.  CT head and MRI head in November 2019 were negative.  Will recheck orthostatics though less likely to be contributing to her symptoms.  Will obtain EEG.  Can consider neurology evaluation for atypical  seizures/pseudoseizures.  Will add Bentyl for abdominal spasms/possible IBS.  Avoid IV opiates or ketamine as patient may be malingering/have Munchhausen's. Downgrade to MedSurg bed.

## 2018-01-30 NOTE — Plan of Care (Signed)
  Problem: Education: Goal: Knowledge of General Education information will improve Description: Including pain rating scale, medication(s)/side effects and non-pharmacologic comfort measures Outcome: Progressing   Problem: Pain Managment: Goal: General experience of comfort will improve Outcome: Progressing   Problem: Safety: Goal: Ability to remain free from injury will improve Outcome: Progressing   

## 2018-01-30 NOTE — ED Notes (Signed)
Lunch tray ordered for pt.

## 2018-01-30 NOTE — ED Notes (Signed)
Attempted to call report , nurse not available at this time , will call me back in 3 minutes

## 2018-01-30 NOTE — ED Notes (Signed)
Ordered diet tray for pt  

## 2018-01-31 ENCOUNTER — Observation Stay (HOSPITAL_COMMUNITY): Payer: Medicaid Other

## 2018-01-31 DIAGNOSIS — R112 Nausea with vomiting, unspecified: Secondary | ICD-10-CM

## 2018-01-31 DIAGNOSIS — I10 Essential (primary) hypertension: Secondary | ICD-10-CM

## 2018-01-31 DIAGNOSIS — R55 Syncope and collapse: Secondary | ICD-10-CM | POA: Diagnosis not present

## 2018-01-31 DIAGNOSIS — E1165 Type 2 diabetes mellitus with hyperglycemia: Secondary | ICD-10-CM | POA: Diagnosis not present

## 2018-01-31 LAB — GLUCOSE, CAPILLARY
Glucose-Capillary: 196 mg/dL — ABNORMAL HIGH (ref 70–99)
Glucose-Capillary: 133 mg/dL — ABNORMAL HIGH (ref 70–99)
Glucose-Capillary: 185 mg/dL — ABNORMAL HIGH (ref 70–99)
Glucose-Capillary: 196 mg/dL — ABNORMAL HIGH (ref 70–99)

## 2018-01-31 MED ORDER — INSULIN GLARGINE 100 UNIT/ML ~~LOC~~ SOLN
65.0000 [IU] | Freq: Every day | SUBCUTANEOUS | Status: DC
  Administered 2018-01-31: 65 [IU] via SUBCUTANEOUS
  Filled 2018-01-31: qty 0.65

## 2018-01-31 MED ORDER — DICYCLOMINE HCL 10 MG PO CAPS
10.0000 mg | ORAL_CAPSULE | Freq: Three times a day (TID) | ORAL | Status: DC
  Administered 2018-01-31: 10 mg via ORAL
  Filled 2018-01-31: qty 1

## 2018-01-31 MED ORDER — PROPRANOLOL HCL 40 MG PO TABS
40.0000 mg | ORAL_TABLET | Freq: Two times a day (BID) | ORAL | Status: DC
  Administered 2018-01-31: 40 mg via ORAL
  Filled 2018-01-31 (×3): qty 1

## 2018-01-31 NOTE — Progress Notes (Signed)
EEG completed, results pending. 

## 2018-01-31 NOTE — Procedures (Signed)
History: 18 year old female being evaluated for recurrent syncope  Sedation: None  Technique: This is a 21 channel routine scalp EEG performed at the bedside with bipolar and monopolar montages arranged in accordance to the international 10/20 system of electrode placement. One channel was dedicated to EKG recording.    Background: The background consists of intermixed alpha and beta activities. There is a well defined posterior dominant rhythm of 10 hz that attenuates with eye opening. Sleep is not recorded.   Photic stimulation: Physiologic driving is present  EEG Abnormalities: none  Clinical Interpretation: This normal EEG is recorded in the waking  state. There was no seizure or seizure predisposition recorded on this study. Please note that lack of epileptiform activity on EEG does not preclude the possibility of epilepsy.   Ritta Slot, MD [facility] [phone]  If 7pm- 7am, please page neurology on call as listed in AMION.

## 2018-01-31 NOTE — Progress Notes (Signed)
Progress Note    Nancy [name]  XJO:[id number] DOB: 27-Mar-2000  DOA: 01/29/2018 PCP: Claiborne Rigg, NP    Brief Narrative:     Medical records reviewed and are as summarized below:  Nancy [name] is an 18 y.o. female with history of diabetes mellitus type 2 recently admitted for DKA and recurrent syncope with history of cardiomyopathy last EF measured was 45% presents to the ER because of recurrent syncopal episode over the last 24 hours.  Patient states she has had at least 10 episodes in the last 24 hours and was witnessed by her family.  Patient stated the longest she had was around 10 minutes but did not have any seizure-like activities or any hypoglycemic episodes.  Did not have any tongue bite or incontinence of urine.  Some of the episodes were preceded by some dizziness but some of them were just spontaneous without any prodrome.  Patient did not have any loss of function of upper or lower extremity.  Same time patient also has been a persistent chest pain which is chronically present.  Patient states he has been compliant with her medications.  During the recent admission patient amlodipine was discontinued.  Assessment/Plan:   Principal Problem:   Chest pain Active Problems:   Hypertension   Sinus tachycardia by electrocardiogram   Uncontrolled type 2 diabetes mellitus with hyperglycemia (HCC)   Syncope  Recurrent syncopal episodes  -EKG shows QTC of 462 ms.   -During recent admission no orthostatic changes were found.   -Limited 2D echo done on January 25, 2018 showed EF of 45 to 50%.   -suspect patient was dehydrated and perhaps orthostatic prior to IVF -EEG done but doubtful to show anything  Abdominal pain with N/V -per patient, she has had HIDA (unable to find) and gastric emptying which were normal -she states her abdominal pain/N/V keep her from eating and her blood sugars drop rapidly and then she forces something in and blood sugars rebound high -she  is requesting narcotics frequently which is worrisome but I have spoken to patient and under no circumstances will she receive narcotics here or when she leaves-- she expressed understanding -another red flag is that she is eating 100% of her meals but c/o abdominal pain -? Abdominal migraines-- does say she has migraine headaches-- will do trial of propranolol for now -is due to follow up with [facility] (Dr. Leta Jungling PA) in few weeks  Chest pain  -appears atypical and is reproducible on palpation.  Coronary calcium scoring done on January 14, 2018 was 0.  Troponins are negative.  Sinus tachycardia  -related to dehydration from poorly controlled DM  Hypertension  -change BP medication and monitor  Cardiomyopathy last EF was 45 to 50% appears compensated.  Poorly controlled Diabetes mellitus type 1 on insulin recently admitted for DKA.   -HgbA1c 12 -per chart review appears she is a type 1 not type 2 -says she is compliant with her medications but doubt this -titrate medications-- would like to have controlled better prior to d/c  Depression  -Seroquel. -denies any current symptoms    Family Communication/Anticipated D/C date and plan/Code Status   DVT prophylaxis: Lovenox ordered. Code Status: Full Code.  Family Communication:  Disposition Plan: home in the AM?   Medical Consultants:    None.   Anti-Infectives:    None  Subjective:   C/o abdominal pain   Objective:    Vitals:   01/30/18 2124 01/31/18 0009 01/31/18 0541  01/31/18 1211  BP: 97/67 105/76 111/75 107/66  Pulse:  90 84 91  Resp:  18 18 18   Temp:  97.9 F (36.6 C) 98.6 F (37 C) 98.6 F (37 C)  TempSrc:  Oral Oral Oral  SpO2:  100% 100% 100%    Intake/Output Summary (Last 24 hours) at 01/31/2018 1543 Last data filed at 01/31/2018 1300 Gross per 24 hour  Intake 600 ml  Output -  Net 600 ml   There were no vitals filed for this visit.  Exam: In bed, NAD Poor eye  contact rrr +BS, soft, non-tender when distracted No LE edema  Data Reviewed:   I have personally reviewed following labs and imaging studies:  Labs: Labs show the following:   Basic Metabolic Panel: Recent Labs  Lab 01/24/18 2007 01/25/18 0114 01/26/18 0327 01/29/18 1740 01/29/18 2215 01/30/18 0409  NA 131* 138 137 134* 134* 137  K 3.7 3.5 3.9 4.2 4.0 3.8  CL 96* 107 103 96*  --  106  CO2 24 23 27 24   --  21*  GLUCOSE 262* 269* 276* 317*  --  298*  BUN 8 11 13 9   --  11  CREATININE 0.51 0.98 0.65 0.56  --  0.64  CALCIUM 8.5* 8.0* 8.5* 9.4  --  8.6*   GFR Estimated Creatinine Clearance: 115.7 mL/min (by C-G formula based on SCr of 0.64 mg/dL). Liver Function Tests: Recent Labs  Lab 01/26/18 0327 01/29/18 2135 01/30/18 0409  AST 13* 19 15  ALT 16 25 24   ALKPHOS 51 62 56  BILITOT 0.3 0.6 0.9  PROT 5.1* 7.0 6.3*  ALBUMIN 2.9* 4.0 3.6   Recent Labs  Lab 01/26/18 0327 01/29/18 2135  LIPASE 25 23   No results for input(s): AMMONIA in the last 168 hours. Coagulation profile Recent Labs  Lab 01/29/18 2135  INR 0.91    CBC: Recent Labs  Lab 01/29/18 1740 01/29/18 2215 01/30/18 0409  WBC 8.2  --  8.2  NEUTROABS  --   --  3.2  HGB 15.1* 16.0* 13.2  HCT 45.8 47.0* 41.8  MCV 85.9  --  87.4  PLT 252  --  228   Cardiac Enzymes: Recent Labs  Lab 01/25/18 0114 01/25/18 0520 01/30/18 0409 01/30/18 0947 01/30/18 1610  TROPONINI <0.03 <0.03 <0.03 <0.03 <0.03   BNP (last 3 results) No results for input(s): PROBNP in the last 8760 hours. CBG: Recent Labs  Lab 01/30/18 1216 01/30/18 1547 01/30/18 2151 01/31/18 0634 01/31/18 1135  GLUCAP 150* 143* 191* 196* 133*   D-Dimer: No results for input(s): DDIMER in the last 72 hours. Hgb A1c: No results for input(s): HGBA1C in the last 72 hours. Lipid Profile: No results for input(s): CHOL, HDL, LDLCALC, TRIG, CHOLHDL, LDLDIRECT in the last 72 hours. Thyroid function studies: Recent Labs     01/30/18 0409  TSH 4.053   Anemia work up: No results for input(s): VITAMINB12, FOLATE, FERRITIN, TIBC, IRON, RETICCTPCT in the last 72 hours. Sepsis Labs: Recent Labs  Lab 01/29/18 1740 01/30/18 0409  WBC 8.2 8.2    Microbiology Recent Results (from the past 240 hour(s))  MRSA PCR Screening     Status: None   Collection Time: 01/24/18  6:40 PM  Result Value Ref Range Status   MRSA by PCR NEGATIVE NEGATIVE Final    Comment:        The GeneXpert MRSA Assay (FDA approved for NASAL specimens only), is one component of a comprehensive MRSA colonization  surveillance program. It is not intended to diagnose MRSA infection nor to guide or monitor treatment for MRSA infections. Performed at [facility], [address] [place], [place] [address]     Procedures and diagnostic studies:  Dg Chest 2 View  Result Date: 01/29/2018 CLINICAL DATA:  Chest pain and short of breath EXAM: CHEST - 2 VIEW COMPARISON:  01/16/2018 FINDINGS: The heart size and mediastinal contours are within normal limits. Both lungs are clear. The visualized skeletal structures are unremarkable. IMPRESSION: No active cardiopulmonary disease. Electronically Signed   By: Jasmine Pang M.D.   On: 01/29/2018 18:51    Medications:   . escitalopram  20 mg Oral Daily  . insulin aspart  0-9 Units Subcutaneous TID WC  . insulin aspart  15 Units Subcutaneous BID WC  . insulin glargine  65 Units Subcutaneous QHS  . pantoprazole  40 mg Oral Daily  . propranolol  40 mg Oral BID  . QUEtiapine  300 mg Oral QHS   Continuous Infusions:   LOS: 0 days   Joseph Art  [facility]   *Please refer to [email or web address], password [email or web address] to get updated schedule on who will round on this patient, as hospitalists switch teams weekly. If 7PM-7AM, please contact night-coverage at [email or web address], password [email or web address] for any overnight needs.  01/31/2018, 3:43 PM

## 2018-01-31 NOTE — Plan of Care (Signed)

## 2018-02-01 ENCOUNTER — Ambulatory Visit: Payer: Medicaid Other | Admitting: Nurse Practitioner

## 2018-02-01 DIAGNOSIS — R000 Tachycardia, unspecified: Secondary | ICD-10-CM | POA: Diagnosis not present

## 2018-02-01 DIAGNOSIS — E1069 Type 1 diabetes mellitus with other specified complication: Secondary | ICD-10-CM

## 2018-02-01 DIAGNOSIS — R079 Chest pain, unspecified: Secondary | ICD-10-CM | POA: Diagnosis not present

## 2018-02-01 DIAGNOSIS — R55 Syncope and collapse: Secondary | ICD-10-CM | POA: Diagnosis not present

## 2018-02-01 LAB — GLUCOSE, CAPILLARY
Glucose-Capillary: 138 mg/dL — ABNORMAL HIGH (ref 70–99)
Glucose-Capillary: 92 mg/dL (ref 70–99)

## 2018-02-01 LAB — BASIC METABOLIC PANEL
Anion gap: 9 (ref 5–15)
BUN: 17 mg/dL (ref 6–20)
CO2: 25 mmol/L (ref 22–32)
Calcium: 8.5 mg/dL — ABNORMAL LOW (ref 8.9–10.3)
Chloride: 105 mmol/L (ref 98–111)
Creatinine, Ser: 0.64 mg/dL (ref 0.44–1.00)
GFR calc Af Amer: >60 mL/min (ref >60)
GFR calc non Af Amer: >60 mL/min (ref >60)
Glucose, Bld: 142 mg/dL — ABNORMAL HIGH (ref 70–99)
Potassium: 3.8 mmol/L (ref 3.5–5.1)
SODIUM: 139 mmol/L (ref 135–145)

## 2018-02-01 MED ORDER — PANTOPRAZOLE SODIUM 40 MG PO TBEC
40.0000 mg | DELAYED_RELEASE_TABLET | Freq: Two times a day (BID) | ORAL | Status: DC
  Administered 2018-02-01: 40 mg via ORAL

## 2018-02-01 MED ORDER — PROPRANOLOL HCL 20 MG PO TABS
20.0000 mg | ORAL_TABLET | Freq: Two times a day (BID) | ORAL | Status: DC
  Administered 2018-02-01: 20 mg via ORAL

## 2018-02-01 MED ORDER — ONDANSETRON HCL 4 MG PO TABS: 4.0000 mg | ORAL_TABLET | Freq: Three times a day (TID) | ORAL | 0 refills | Status: DC

## 2018-02-01 MED ORDER — ONDANSETRON HCL 4 MG PO TABS
4.0000 mg | ORAL_TABLET | Freq: Three times a day (TID) | ORAL | Status: DC
  Administered 2018-02-01: 4 mg via ORAL
  Filled 2018-02-01: qty 1

## 2018-02-01 MED ORDER — PANTOPRAZOLE SODIUM 40 MG PO TBEC: 40.0000 mg | DELAYED_RELEASE_TABLET | Freq: Two times a day (BID) | ORAL | 0 refills | Status: DC

## 2018-02-01 MED ORDER — INSULIN GLARGINE 100 UNIT/ML SOLOSTAR PEN: 65.0000 [IU] | PEN_INJECTOR | Freq: Every day | SUBCUTANEOUS | 11 refills | Status: DC

## 2018-02-01 MED ORDER — SUMATRIPTAN SUCCINATE 6 MG/0.5ML ~~LOC~~ SOLN
6.0000 mg | Freq: Once | SUBCUTANEOUS | Status: AC
  Administered 2018-02-01: 6 mg via SUBCUTANEOUS
  Filled 2018-02-01: qty 0.5

## 2018-02-01 MED ORDER — PROPRANOLOL HCL 20 MG PO TABS: 20.0000 mg | ORAL_TABLET | Freq: Two times a day (BID) | ORAL | 0 refills | Status: DC

## 2018-02-01 MED FILL — PROPRANOLOL 20 MG TABLET: 20 | 30 days supply | Qty: 60 | Fill #0

## 2018-02-01 NOTE — Discharge Summary (Signed)
Physician Discharge Summary  Nancy [name]:[id number] DOB: 03-06-2000 DOA: 01/29/2018  PCP: [name], NP  Admit date: 01/29/2018 Discharge date: 02/01/2018  Admitted From: home Discharge disposition: home   Recommendations for Outpatient Follow-Up:   1. Needs better diabetic control 2. Close GI follow up-- ? HIDA done? 3. Would not prescribe narcotics as has several red flags: 1) takes mom's opioids from home 2) always a 10/10 for pain despite eating 100% of meals    Discharge Diagnosis:   Principal Problem:   Chest pain Active Problems:   Hypertension   DM (diabetes mellitus), type 1 (HCC)   Sinus tachycardia by electrocardiogram   Syncope    Discharge Condition: Improved.  Diet recommendation: Low sodium, heart healthy.  Carbohydrate-modified  Wound care: None.  Code status: Full.   History of Present Illness:   Nancy [name] is a 18 y.o. female with history of diabetes mellitus type 2 recently admitted for DKA and recurrent syncope with history of cardiomyopathy last EF measured was 45% presents to the ER because of recurrent syncopal episode over the last 24 hours.  Patient states she has had at least 10 episodes in the last 24 hours and was witnessed by her family.  Patient stated the longest she had was around 10 minutes but did not have any seizure-like activities or any hypoglycemic episodes.  Did not have any tongue bite or incontinence of urine.  Some of the episodes were preceded by some dizziness but some of them were just spontaneous without any prodrome.  Patient did not have any loss of function of upper or lower extremity.  Same time patient also has been a persistent chest pain which is chronically present.  Patient states he has been compliant with her medications.  During the recent admission patient amlodipine was discontinued.   Hospital Course by Problem:   Recurrent syncopal episodes -EKG shows QTC of 462 ms.  -Limited  2D echo done on January 25, 2018 showed EF of 45 to 50%.  -suspect patient was dehydrated and perhaps orthostatic prior to IVF -EEG  Read as normal -no events here  Abdominal pain with N/V -per patient, she has had HIDA (unable to find) and gastric emptying which were normal -she states her abdominal pain/N/V keep her from eating and her blood sugars drop rapidly and then she forces something in and blood sugars rebound high -she is requesting narcotics frequently which is worrisome but I have spoken to patient and under no circumstances will she receive narcotics here or when she leaves-- she expressed understanding -another red flag is that she is eating 100% of her meals but c/o abdominal pain -? Abdominal migraines-- does say she has migraine headaches-- will change to propranolol for prevention -is due to follow up with [facility] (Dr. Azalee Course PA) in few weeks  Chest pain  -appears atypical and is reproducible on palpation. Coronary calcium scoring done on January 14, 2018 was 0. Troponins are negative.  Sinus tachycardia  -related to dehydration from poorly controlled DM  Hypertension  -change BP medication and monitor  Cardiomyopathy last EF was 45 to 50% appears compensated.  Poorly controlled Diabetes mellitus type 1 on insulin recently admitted for DKA.  -HgbA1c 12 -per chart review appears she is a type 1 not type 2 -says she is compliant with her medications but doubt this -titrate medications-- doubt she is taking medications at home  Depression  -Seroquel. -denies any current symptoms  Medical Consultants:      Discharge Exam:   Vitals:   02/01/18 0033 02/01/18 0633  BP: 100/68 100/66  Pulse: 73 82  Resp:  18  Temp: 98.4 F (36.9 C) 98 F (36.7 C)  SpO2: 99% 100%   Vitals:   01/31/18 1700 02/01/18 0033 02/01/18 0633 02/01/18 1050  BP: 117/78 100/68 100/66   Pulse: 95 73 82   Resp: 18  18   Temp: 98.6 F (37 C) 98.4 F (36.9  C) 98 F (36.7 C)   TempSrc: Oral Oral Oral   SpO2: 99% 99% 100%   Weight:    83.3 kg  Height:    5' 3"  (1.6 m)    General exam: NAD   The results of significant diagnostics from this hospitalization (including imaging, microbiology, ancillary and laboratory) are listed below for reference.     Procedures and Diagnostic Studies:   Dg Chest 2 View  Result Date: 01/29/2018 CLINICAL DATA:  Chest pain and short of breath EXAM: CHEST - 2 VIEW COMPARISON:  01/16/2018 FINDINGS: The heart size and mediastinal contours are within normal limits. Both lungs are clear. The visualized skeletal structures are unremarkable. IMPRESSION: No active cardiopulmonary disease. Electronically Signed   By: Donavan Foil M.D.   On: 01/29/2018 18:51     Labs:   Basic Metabolic Panel: Recent Labs  Lab 01/26/18 0327 01/29/18 1740 01/29/18 2215 01/30/18 0409 02/01/18 0622  NA 137 134* 134* 137 139  K 3.9 4.2 4.0 3.8 3.8  CL 103 96*  --  106 105  CO2 27 24  --  21* 25  GLUCOSE 276* 317*  --  298* 142*  BUN 13 9  --  11 17  CREATININE 0.65 0.56  --  0.64 0.64  CALCIUM 8.5* 9.4  --  8.6* 8.5*   GFR Estimated Creatinine Clearance: 115.7 mL/min (by C-G formula based on SCr of 0.64 mg/dL). Liver Function Tests: Recent Labs  Lab 01/26/18 0327 01/29/18 2135 01/30/18 0409  AST 13* 19 15  ALT 16 25 24   ALKPHOS 51 62 56  BILITOT 0.3 0.6 0.9  PROT 5.1* 7.0 6.3*  ALBUMIN 2.9* 4.0 3.6   Recent Labs  Lab 01/26/18 0327 01/29/18 2135  LIPASE 25 23   No results for input(s): AMMONIA in the last 168 hours. Coagulation profile Recent Labs  Lab 01/29/18 2135  INR 0.91    CBC: Recent Labs  Lab 01/29/18 1740 01/29/18 2215 01/30/18 0409  WBC 8.2  --  8.2  NEUTROABS  --   --  3.2  HGB 15.1* 16.0* 13.2  HCT 45.8 47.0* 41.8  MCV 85.9  --  87.4  PLT 252  --  228   Cardiac Enzymes: Recent Labs  Lab 01/30/18 0409 01/30/18 0947 01/30/18 1610  TROPONINI <0.03 <0.03 <0.03    BNP: Invalid input(s): POCBNP CBG: Recent Labs  Lab 01/31/18 1135 01/31/18 1626 01/31/18 2120 02/01/18 0647 02/01/18 1056  GLUCAP 133* 185* 196* 138* 92   D-Dimer No results for input(s): DDIMER in the last 72 hours. Hgb A1c No results for input(s): HGBA1C in the last 72 hours. Lipid Profile No results for input(s): CHOL, HDL, LDLCALC, TRIG, CHOLHDL, LDLDIRECT in the last 72 hours. Thyroid function studies Recent Labs    01/30/18 0409  TSH 4.053   Anemia work up No results for input(s): VITAMINB12, FOLATE, FERRITIN, TIBC, IRON, RETICCTPCT in the last 72 hours. Microbiology Recent Results (from the past 240 hour(s))  MRSA PCR Screening  Status: None   Collection Time: 01/24/18  6:40 PM  Result Value Ref Range Status   MRSA by PCR NEGATIVE NEGATIVE Final    Comment:        The GeneXpert MRSA Assay (FDA approved for NASAL specimens only), is one component of a comprehensive MRSA colonization surveillance program. It is not intended to diagnose MRSA infection nor to guide or monitor treatment for MRSA infections. Performed at [facility], [place] [address], [place], [place] [address]      Discharge Instructions:   Discharge Instructions    Diet Carb Modified   Complete by:  As directed    Discharge instructions   Complete by:  As directed    Keep appointment with GI Keep log of blood sugars and bring to PCP Very important to stay hydrated   Increase activity slowly   Complete by:  As directed      Allergies as of 02/01/2018      Reactions   Ibuprofen Other (See Comments)   GI MD said to not take this anymore      Medication List    STOP taking these medications   metoprolol tartrate 25 MG tablet Commonly known as:  LOPRESSOR   traMADol 50 MG tablet Commonly known as:  ULTRAM     TAKE these medications   alum & mag hydroxide-simeth 200-200-20 MG/5ML suspension Commonly known as:  MAALOX/MYLANTA Take 30 mLs by mouth every 4 (four)  hours as needed for indigestion or heartburn.   diclofenac sodium 1 % Gel Commonly known as:  VOLTAREN Apply 2 g topically 4 (four) times daily. What changed:  additional instructions   escitalopram 10 MG tablet Commonly known as:  LEXAPRO Take 20 mg by mouth daily.   glucagon 1 MG injection Use for Severe Hypoglycemia . Inject 1 mg intramuscularly if unresponsive, unable to swallow, unconscious and/or has seizure What changed:    how much to take  how to take this  when to take this  reasons to take this  additional instructions   glucose blood test strip Commonly known as:  TRUE METRIX BLOOD GLUCOSE TEST Use as instructed   Insulin Glargine 100 UNIT/ML Solostar Pen Commonly known as:  LANTUS Inject 65 Units into the skin at bedtime. What changed:  how much to take   insulin lispro 100 UNIT/ML injection Commonly known as:  HUMALOG Inject 0.15 mLs (15 Units total) into the skin 2 (two) times daily with a meal for 30 days.   Insulin Pen Needle 31G X 5 MM Misc Commonly known as:  B-D UF III MINI PEN NEEDLES Use as instructed. Monitor blood glucose levels twice per day   ondansetron 4 MG tablet Commonly known as:  ZOFRAN Take 1 tablet (4 mg total) by mouth 3 (three) times daily with meals. What changed:    when to take this  reasons to take this   pantoprazole 40 MG tablet Commonly known as:  PROTONIX Take 1 tablet (40 mg total) by mouth 2 (two) times daily. What changed:  when to take this   propranolol 20 MG tablet Commonly known as:  INDERAL Take 1 tablet (20 mg total) by mouth 2 (two) times daily.   QUEtiapine 100 MG tablet Commonly known as:  SEROQUEL Take 300 mg by mouth at bedtime.   TRUE METRIX METER w/Device Kit Use as instructed. Monitor blood glucose levels twice per day   TRUEPLUS LANCETS 28G Misc Use as instructed. Monitor blood glucose levels twice  per day      Follow-up Information    [name], NP Follow up in 1 week(s).    Specialty:  Nurse Practitioner Contact information: [place] [address] [phone]            Time coordinating discharge: 25 min  Signed:  Geradine Girt DO  [facility] 02/01/2018, 2:40 PM

## 2018-02-02 ENCOUNTER — Telehealth: Payer: Self-pay

## 2018-02-02 NOTE — Telephone Encounter (Signed)
NOTED

## 2018-02-02 NOTE — Telephone Encounter (Signed)
Transition Care Management Follow-up Telephone Call  Date of discharge and from where: 02/01/2018, [facility].   How have you been since you were released from the hospital? She said that she is feeling " okay" no complaints.   Any questions or concerns? No questions or concerns reported.   Items Reviewed:  Did the pt receive and understand the discharge instructions provided? Yes and she said that she did not have any questions.   Medications obtained and verified? She has not yet obtained the propanolol. She said that she has all other medications and did not have any questions and did not want to review the medication list. She did correctly state how much lantus and humalog she is taking.    Any new allergies?  None reported  Do you have support at home? Her mother  Other (ie: DME, Home Health, etc) she has a glucometer and checks blood sugars twice daily.  She reported this morning her blood sugar was 120.  She also stated that she keeps a log of the blood sugars.   Functional Questionnaire: (I = Independent and D = Dependent)       ADL's: independent   Follow up appointments reviewed:    PCP Hospital f/u appt confirmed? Yes, she has an appointment on 02/15/2018. She was offered an appointment on 2/7 or 2/10 and she preferred to keep the appointment she already has.   Specialist Hospital f/u appt confirmed?remined her of GI appointment on 02/04/2018. .  Are transportation arrangements needed? no  If their condition worsens, is the pt aware to call  their PCP or go to the ED? yes  Was the patient provided with contact information for the PCP's office or ED? She has the clinic phone number.  Was the pt encouraged to call back with questions?  yes

## 2018-02-04 ENCOUNTER — Ambulatory Visit: Payer: Medicaid Other | Admitting: Gastroenterology

## 2018-02-09 ENCOUNTER — Emergency Department (HOSPITAL_COMMUNITY)
Admission: EM | Admit: 2018-02-09 | Discharge: 2018-02-09 | Disposition: A | Discharge status: Home / Self Care | Payer: Medicaid Other | Attending: Emergency Medicine | Admitting: Emergency Medicine

## 2018-02-09 ENCOUNTER — Emergency Department (HOSPITAL_COMMUNITY): Payer: Medicaid Other

## 2018-02-09 ENCOUNTER — Inpatient Hospital Stay: Payer: Medicaid Other | Admitting: Critical Care Medicine

## 2018-02-09 ENCOUNTER — Encounter (HOSPITAL_COMMUNITY): Payer: Self-pay

## 2018-02-09 DIAGNOSIS — E1165 Type 2 diabetes mellitus with hyperglycemia: Secondary | ICD-10-CM | POA: Insufficient documentation

## 2018-02-09 DIAGNOSIS — Z79899 Other long term (current) drug therapy: Secondary | ICD-10-CM | POA: Diagnosis not present

## 2018-02-09 DIAGNOSIS — I509 Heart failure, unspecified: Secondary | ICD-10-CM | POA: Diagnosis not present

## 2018-02-09 DIAGNOSIS — Z794 Long term (current) use of insulin: Secondary | ICD-10-CM | POA: Insufficient documentation

## 2018-02-09 DIAGNOSIS — I110 Hypertensive heart disease with heart failure: Secondary | ICD-10-CM | POA: Insufficient documentation

## 2018-02-09 DIAGNOSIS — R079 Chest pain, unspecified: Secondary | ICD-10-CM | POA: Diagnosis present

## 2018-02-09 DIAGNOSIS — R739 Hyperglycemia, unspecified: Secondary | ICD-10-CM

## 2018-02-09 LAB — CBC
HCT: 44 % (ref 36.0–46.0)
Hemoglobin: 14.4 g/dL (ref 12.0–15.0)
MCH: 27.9 pg (ref 26.0–34.0)
MCHC: 32.7 g/dL (ref 30.0–36.0)
MCV: 85.3 fL (ref 80.0–100.0)
Platelets: 322 10*3/uL (ref 150–400)
RBC: 5.16 MIL/uL — ABNORMAL HIGH (ref 3.87–5.11)
RDW: 14.2 % (ref 11.5–15.5)
WBC: 9.7 10*3/uL (ref 4.0–10.5)
nRBC: 0 % (ref 0.0–0.2)

## 2018-02-09 LAB — COMPREHENSIVE METABOLIC PANEL
ALT: 19 U/L (ref 0–44)
AST: 17 U/L (ref 15–41)
Albumin: 4.2 g/dL (ref 3.5–5.0)
Alkaline Phosphatase: 66 U/L (ref 38–126)
Anion gap: 12 (ref 5–15)
BUN: 13 mg/dL (ref 6–20)
CO2: 21 mmol/L — ABNORMAL LOW (ref 22–32)
CREATININE: 0.63 mg/dL (ref 0.44–1.00)
Calcium: 9.5 mg/dL (ref 8.9–10.3)
Chloride: 101 mmol/L (ref 98–111)
GFR calc Af Amer: >60 mL/min (ref >60)
Glucose, Bld: 320 mg/dL — ABNORMAL HIGH (ref 70–99)
Potassium: 4.2 mmol/L (ref 3.5–5.1)
Sodium: 134 mmol/L — ABNORMAL LOW (ref 135–145)
Total Bilirubin: 0.5 mg/dL (ref 0.3–1.2)
Total Protein: 7 g/dL (ref 6.5–8.1)

## 2018-02-09 LAB — URINALYSIS, ROUTINE W REFLEX MICROSCOPIC
Bilirubin Urine: NEGATIVE
Glucose, UA: >=500 mg/dL — AB
Hgb urine dipstick: NEGATIVE
Ketones, ur: 5 mg/dL — AB
Leukocytes, UA: NEGATIVE
Nitrite: NEGATIVE
Protein, ur: NEGATIVE mg/dL
Specific Gravity, Urine: 1.04 — ABNORMAL HIGH (ref 1.005–1.030)
pH: 5 (ref 5.0–8.0)

## 2018-02-09 LAB — I-STAT TROPONIN, ED: TROPONIN I, POC: 0 ng/mL (ref 0.00–0.08)

## 2018-02-09 LAB — LIPASE, BLOOD: LIPASE: 21 U/L (ref 11–51)

## 2018-02-09 LAB — I-STAT BETA HCG BLOOD, ED (MC, WL, AP ONLY): I-stat hCG, quantitative: <5 m[IU]/mL (ref <5)

## 2018-02-09 LAB — CBG MONITORING, ED
Glucose-Capillary: 322 mg/dL — ABNORMAL HIGH (ref 70–99)
Glucose-Capillary: 231 mg/dL — ABNORMAL HIGH (ref 70–99)
Glucose-Capillary: 174 mg/dL — ABNORMAL HIGH (ref 70–99)

## 2018-02-09 MED ORDER — INSULIN ASPART 100 UNIT/ML ~~LOC~~ SOLN
5.0000 [IU] | Freq: Once | SUBCUTANEOUS | Status: AC
  Administered 2018-02-09: 5 [IU] via SUBCUTANEOUS

## 2018-02-09 MED ORDER — SODIUM CHLORIDE 0.9 % IV BOLUS
1000.0000 mL | Freq: Once | INTRAVENOUS | Status: AC
  Administered 2018-02-09: 1000 mL via INTRAVENOUS

## 2018-02-09 MED ORDER — SODIUM CHLORIDE 0.9% FLUSH: 3.0000 mL | Freq: Once | INTRAVENOUS | Status: DC

## 2018-02-09 NOTE — ED Provider Notes (Signed)
[facility] EMERGENCY DEPARTMENT Provider Note   CSN: [id number] Arrival date & time: 02/09/18  1432     History   Chief Complaint Chief Complaint  Patient presents with  . Chest Pain  . Abdominal Pain  . Loss of Consciousness    HPI Nancy [name] is a 18 y.o. female.  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  She has had multiple evaluations in the past for her diabetes, she is frequently seen for hyperglycemic and diabetic ketoacidosis events and has a history of frequent near syncope and syncope which has been evaluated multiple times in the inpatient setting most recently from January 24 through January 27.  During that admission it was noted that the patient had almost constant chest pain abdominal pain and complained of nausea and vomiting but despite that ate 100% of her meals, had no episodes of near syncope or syncope despite stating that she had at least 10 episodes per day leading up to the admission.  She had EEG monitoring which showed no signs of seizure activity and had EKGs which showed no signs of arrhythmias or prolonged QT.  She was discharged in good condition however it was noted that her A1c was 12 and she had had poor glycemic control.  The patient reports that she comes in today with multiple complaints including chest pain, abdominal pain, frequent nausea and vomiting, multiple syncopal events today totaling 10.  Some of these events she remembers including feeling lightheaded before she falls into the wall and to the ground and some she does not know anything about but was told by her mother.  She reports that she is taking her medications, she has had better control over her diabetes with sugars less than 200 on most days.  She has been eating and  drinking but states that she has had multiple episodes of nausea and vomiting which is very consistent with her.  She has had multiple evaluations for the symptoms in the past including a gastric emptying study, HIDA scan, ultrasounds and has ultimately not been found to have an etiology of her symptoms and thus abdominal migraines were entertained by the discharging team at her last admission.  The patient specifically denies fevers or chills, she denies headaches, blurred vision, sore throat and denies any dysuria, diarrhea, hematuria, increased swelling of her legs or rashes.  Past Medical History:  Diagnosis Date  . Acanthosis nigricans   . Anxiety   . CHF (congestive heart failure) ([place])   . Chronic lower back pain   . Depression   . Dyspepsia   . Obesity   . Ovarian cyst    pt is not aware of this hx (11/24/2017)  . Pre-diabetes   . 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 Premature baby   . Type II diabetes mellitus ([facility])    insulin dependant    Patient Active Problem List   Diagnosis Date Noted  . Syncope 01/30/2018  . Orthostatic hypotension 01/24/2018  . DKA (diabetic ketoacidosis) ([place]) 01/24/2018  . Chronic abdominal pain 12/24/2017  . Sinus tachycardia by electrocardiogram   . Acute lower UTI   . Uncontrolled type 2 diabetes mellitus with hyperglycemia ([place])   . Chest pain 12/19/2017  . Generalized abdominal pain 08/21/2017  . Non compliance with medical  treatment 01/27/2012  . Adjustment disorder 09/16/2011  . DM (diabetes mellitus), type 1 ([place]) 09/15/2011  . Dyspepsia   . Acanthosis nigricans   . Goiter   . Obesity 06/14/2010  . Hypertension 06/14/2010    Past Surgical History:  Procedure Laterality Date  . ABDOMINAL HERNIA REPAIR     "I was a baby"  . HERNIA REPAIR    . TONSILLECTOMY AND ADENOIDECTOMY    . WISDOM TOOTH EXTRACTION  2017     OB History   No obstetric history on file.      Home Medications    Prior to Admission medications     Medication Sig Start Date End Date Taking? Authorizing Provider  alum & mag hydroxide-simeth (MAALOX/MYLANTA) 200-200-20 MG/5ML suspension Take 30 mLs by mouth every 4 (four) hours as needed for indigestion or heartburn. 01/26/18   Aline August, MD  Blood Glucose Monitoring Suppl (TRUE METRIX METER) w/Device KIT Use as instructed. Monitor blood glucose levels twice per day 12/02/17   [name], NP  diclofenac sodium (VOLTAREN) 1 % GEL Apply 2 g topically 4 (four) times daily. Patient taking differently: Apply 2 g topically 4 (four) times daily. to painful areas of chest and/or stomach 12/20/17   Debbe Odea, MD  escitalopram (LEXAPRO) 10 MG tablet Take 20 mg by mouth daily.    [provider]  glucagon 1 MG injection Use for Severe Hypoglycemia . Inject 1 mg intramuscularly if unresponsive, unable to swallow, unconscious and/or has seizure Patient taking differently: Inject 1 mg into the muscle once as needed (for severe hypoglycemia- if unresponsive, unable to swallow, unconscious and/or has seizure).  07/20/13 12/19/25  Renato Shin, MD  glucose blood (TRUE METRIX BLOOD GLUCOSE TEST) test strip Use as instructed 12/02/17   [name], NP  Insulin Glargine (LANTUS) 100 UNIT/ML Solostar Pen Inject 65 Units into the skin at bedtime. 02/01/18   Geradine Girt, DO  insulin lispro (HUMALOG) 100 UNIT/ML injection Inject 0.15 mLs (15 Units total) into the skin 2 (two) times daily with a meal for 30 days. 01/29/18 02/28/18  [name], NP  Insulin Pen Needle (B-D UF III MINI PEN NEEDLES) 31G X 5 MM MISC Use as instructed. Monitor blood glucose levels twice per day 12/02/17   [name], NP  ondansetron (ZOFRAN) 4 MG tablet Take 1 tablet (4 mg total) by mouth 3 (three) times daily with meals. 02/01/18   Geradine Girt, DO  pantoprazole (PROTONIX) 40 MG tablet Take 1 tablet (40 mg total) by mouth 2 (two) times daily. 02/01/18   Geradine Girt, DO  propranolol (INDERAL) 20 MG  tablet Take 1 tablet (20 mg total) by mouth 2 (two) times daily. 02/01/18   Geradine Girt, DO  QUEtiapine (SEROQUEL) 100 MG tablet Take 300 mg by mouth at bedtime.     [provider]  TRUEPLUS LANCETS 28G MISC Use as instructed. Monitor blood glucose levels twice per day 12/02/17   [name], NP    Family History Family History  Problem Relation Age of Onset  . Diabetes Mother   . Hypertension Mother   . Obesity Mother   . Asthma Mother   . Allergic rhinitis Mother   . Eczema Mother   . Diabetes Father   . Hypertension Father   . Obesity Father   . Hyperlipidemia Father   . Hypertension Paternal Aunt   . Hypertension Maternal Grandfather   . Colon cancer Maternal Grandfather   .  Diabetes Paternal Grandmother   . Obesity Paternal Grandmother   . Diabetes Paternal Grandfather   . Obesity Paternal Grandfather   . Angioedema Neg Hx   . Immunodeficiency Neg Hx   . Urticaria Neg Hx   . Stomach cancer Neg Hx   . Esophageal cancer Neg Hx     Social History Social History   Tobacco Use  . Smoking status: Never Smoker  . Smokeless tobacco: Never Used  Substance Use Topics  . Alcohol use: No    Alcohol/week: 0.0 standard drinks  . Drug use: No     Allergies   Ibuprofen   Review of Systems Review of Systems  All other systems reviewed and are negative.    Physical Exam Updated Vital Signs BP 114/76 (BP Location: Right Arm)   Pulse (!) 113   Temp 98.6 F (37 C) (Oral)   Resp 20   LMP 01/13/2018   SpO2 100%   Physical Exam Vitals signs and nursing note reviewed.  Constitutional:      General: She is not in acute distress.    Appearance: She is well-developed.  HENT:     Head: Normocephalic and atraumatic.     Mouth/Throat:     Pharynx: No oropharyngeal exudate.  Eyes:     General: No scleral icterus.       Right eye: No discharge.        Left eye: No discharge.     Conjunctiva/sclera: Conjunctivae normal.     Pupils: Pupils are equal,  round, and reactive to light.  Neck:     Musculoskeletal: Normal range of motion and neck supple.     Thyroid: No thyromegaly.     Vascular: No JVD.  Cardiovascular:     Rate and Rhythm: Regular rhythm. Tachycardia present.     Heart sounds: Normal heart sounds. No murmur. No friction rub. No gallop.      Comments: Pulse of 103 Pulmonary:     Effort: Pulmonary effort is normal. No respiratory distress.     Breath sounds: Normal breath sounds. No wheezing or rales.  Abdominal:     General: Bowel sounds are normal. There is no distension.     Palpations: Abdomen is soft. There is no mass.     Tenderness: There is no abdominal tenderness.  Musculoskeletal: Normal range of motion.        General: No tenderness.  Lymphadenopathy:     Cervical: No cervical adenopathy.  Skin:    General: Skin is warm and dry.     Findings: No erythema or rash.  Neurological:     Mental Status: She is alert.     Coordination: Coordination normal.  Psychiatric:        Behavior: Behavior normal.      ED Treatments / Results  Labs (all labs ordered are listed, but only abnormal results are displayed) Labs Reviewed  COMPREHENSIVE METABOLIC PANEL - Abnormal; Notable for the following components:      Result Value   Sodium 134 (*)    CO2 21 (*)    Glucose, Bld 320 (*)    All other components within normal limits  CBC - Abnormal; Notable for the following components:   RBC 5.16 (*)    All other components within normal limits  URINALYSIS, ROUTINE W REFLEX MICROSCOPIC - Abnormal; Notable for the following components:   APPearance CLOUDY (*)    Specific Gravity, Urine 1.040 (*)    Glucose, UA >=500 (*)  Ketones, ur 5 (*)    Bacteria, UA MANY (*)    All other components within normal limits  CBG MONITORING, ED - Abnormal; Notable for the following components:   Glucose-Capillary 322 (*)    All other components within normal limits  LIPASE, BLOOD  I-STAT BETA HCG BLOOD, ED ([facility], [facility], [facility] ONLY)    I-STAT TROPONIN, ED  CBG MONITORING, ED    EKG EKG Interpretation  Date/Time:  Tuesday February 09 2018 14:37:20 EST Ventricular Rate:  115 PR Interval:  114 QRS Duration: 88 QT Interval:  324 QTC Calculation: 448 R Axis:   86 Text Interpretation:  Sinus tachycardia Right atrial enlargement Borderline ECG since last tracing no significant change Confirmed by Noemi Chapel [phone]) on 02/09/2018 4:44:19 PM   Radiology Dg Chest 2 View  Result Date: 02/09/2018 CLINICAL DATA:  Chest pain.  Nominal pain.  Syncope. EXAM: CHEST - 2 VIEW COMPARISON:  01/29/2018. FINDINGS: Mediastinum and hilar structures normal. Low lung volumes. No focal infiltrate. No pleural effusion pneumothorax. Heart size normal. No acute bony abnormality. IMPRESSION: No acute cardiopulmonary disease. Electronically Signed   By: Marcello Moores  Register   On: 02/09/2018 15:21    Procedures Procedures (including critical care time)  Medications Ordered in ED Medications  sodium chloride flush (NS) 0.9 % injection 3 mL (3 mLs Intravenous Not Given 02/09/18 1708)  sodium chloride 0.9 % bolus 1,000 mL (has no administration in time range)  insulin aspart (novoLOG) injection 5 Units (has no administration in time range)  sodium chloride 0.9 % bolus 1,000 mL (1,000 mLs Intravenous New Bag/Given 02/09/18 1708)     Initial Impression / Assessment and Plan / ED Course  I have reviewed the triage vital signs and the nursing notes.  Pertinent labs & imaging results that were available during my care of the patient were reviewed by me and considered in my medical decision making (see chart for details).     The patient has no reproducible tenderness to palpation, she has a very soft abdomen, her chest is slightly tender, her EKG is unremarkable, chest x-ray is unremarkable and the labs are unremarkable including a normal troponin.  She will be given 1 L of IV fluids but does not have diabetic ketoacidosis based on her lab work, she  has a normal CO2 and no increased anion gap.  Her blood sugar is just over 300.  The patient is agreeable, she does not have any acute findings that make me concerned about other causes, she is already followed by labao or gastroenterology she last had a CT angiogram of her chest on December 9 and a CT coronary scan on January 9 neither of which showed any significant findings  At change of shift the patient has had an evaluation with labs clinical evaluation, urinalysis and she does have a high specific gravity as well as a small amount of ketones in the urine.  She does not have any serum laboratory findings of ketoacidosis which is good, she will receive another liter of fluid as well as some insulin,   I have asked physician assistant Petrucelli to follow-up on repeat CBG and disposition the patient appropriately, I anticipate the patient will be able to be discharged.  I have counseled the patient at length regarding the appropriate use of her medications including insulin and following a diabetic diet.  She has had no episodes of vomiting or syncope while she has been here similar to her prior admission.  Final Clinical Impressions(s) /  ED Diagnoses   Final diagnoses:  Hyperglycemia      Noemi Chapel, MD 02/09/18 1921

## 2018-02-09 NOTE — ED Provider Notes (Signed)
20:00: Assumed care of patient from Dr. Hyacinth MeekerMiller at change of shift pending completion of fluids, insulin, and repeat CBG.  Plan for discharge home pending improving CBG.   Results for orders placed or performed during the hospital encounter of 02/09/18  Lipase, blood  Result Value Ref Range   Lipase 21 11 - 51 U/L  Comprehensive metabolic panel  Result Value Ref Range   Sodium 134 (L) 135 - 145 mmol/L   Potassium 4.2 3.5 - 5.1 mmol/L   Chloride 101 98 - 111 mmol/L   CO2 21 (L) 22 - 32 mmol/L   Glucose, Bld 320 (H) 70 - 99 mg/dL   BUN 13 6 - 20 mg/dL   Creatinine, Ser 1.610.63 0.44 - 1.00 mg/dL   Calcium 9.5 8.9 - 09.610.3 mg/dL   Total Protein 7.0 6.5 - 8.1 g/dL   Albumin 4.2 3.5 - 5.0 g/dL   AST 17 15 - 41 U/L   ALT 19 0 - 44 U/L   Alkaline Phosphatase 66 38 - 126 U/L   Total Bilirubin 0.5 0.3 - 1.2 mg/dL   GFR calc non Af Amer >60 >60 mL/min   GFR calc Af Amer >60 >60 mL/min   Anion gap 12 5 - 15  CBC  Result Value Ref Range   WBC 9.7 4.0 - 10.5 K/uL   RBC 5.16 (H) 3.87 - 5.11 MIL/uL   Hemoglobin 14.4 12.0 - 15.0 g/dL   HCT 04.544.0 40.936.0 - 81.146.0 %   MCV 85.3 80.0 - 100.0 fL   MCH 27.9 26.0 - 34.0 pg   MCHC 32.7 30.0 - 36.0 g/dL   RDW 91.414.2 78.211.5 - 95.615.5 %   Platelets 322 150 - 400 K/uL   nRBC 0.0 0.0 - 0.2 %  Urinalysis, Routine w reflex microscopic  Result Value Ref Range   Color, Urine YELLOW YELLOW   APPearance CLOUDY (A) CLEAR   Specific Gravity, Urine 1.040 (H) 1.005 - 1.030   pH 5.0 5.0 - 8.0   Glucose, UA >=500 (A) NEGATIVE mg/dL   Hgb urine dipstick NEGATIVE NEGATIVE   Bilirubin Urine NEGATIVE NEGATIVE   Ketones, ur 5 (A) NEGATIVE mg/dL   Protein, ur NEGATIVE NEGATIVE mg/dL   Nitrite NEGATIVE NEGATIVE   Leukocytes, UA NEGATIVE NEGATIVE   RBC / HPF 11-20 0 - 5 RBC/hpf   WBC, UA 0-5 0 - 5 WBC/hpf   Bacteria, UA MANY (A) NONE SEEN   Squamous Epithelial / LPF 21-50 0 - 5   Mucus PRESENT   I-Stat beta hCG blood, ED  Result Value Ref Range   I-stat hCG, quantitative <5.0  <5 mIU/mL   Comment 3          I-Stat Troponin, ED [facility])  Result Value Ref Range   Troponin i, poc 0.00 0.00 - 0.08 ng/mL   Comment 3          CBG monitoring, ED  Result Value Ref Range   Glucose-Capillary 322 (H) 70 - 99 mg/dL  CBG monitoring, ED  Result Value Ref Range   Glucose-Capillary 231 (H) 70 - 99 mg/dL  CBG monitoring, ED  Result Value Ref Range   Glucose-Capillary 174 (H) 70 - 99 mg/dL   Dg Chest 2 View  Result Date: 02/09/2018 CLINICAL DATA:  Chest pain.  Nominal pain.  Syncope. EXAM: CHEST - 2 VIEW COMPARISON:  01/29/2018. FINDINGS: Mediastinum and hilar structures normal. Low lung volumes. No focal infiltrate. No pleural effusion pneumothorax. Heart size normal. No acute  bony abnormality. IMPRESSION: No acute cardiopulmonary disease. Electronically Signed   By: Maisie Fus  Register   On: 02/09/2018 15:21   Dg Chest 2 View  Result Date: 01/29/2018 CLINICAL DATA:  Chest pain and short of breath EXAM: CHEST - 2 VIEW COMPARISON:  01/16/2018 FINDINGS: The heart size and mediastinal contours are within normal limits. Both lungs are clear. The visualized skeletal structures are unremarkable. IMPRESSION: No active cardiopulmonary disease. Electronically Signed   By: Jasmine Pang M.D.   On: 01/29/2018 18:51   Dg Chest 2 View  Result Date: 01/16/2018 CLINICAL DATA:  Chest pain and shortness of breath. Two recent syncopal episodes. EXAM: CHEST - 2 VIEW COMPARISON:  01/09/2018 FINDINGS: The heart size and mediastinal contours are within normal limits. Both lungs are clear. The visualized skeletal structures are unremarkable. IMPRESSION: Negative.  No active cardiopulmonary disease. Electronically Signed   By: Myles Rosenthal M.D.   On: 01/16/2018 17:46   Ct Coronary Morph W/cta Cor W/score W/ca W/cm &/or Wo/cm  Addendum Date: 01/14/2018   ADDENDUM REPORT: 01/14/2018 17:17 CLINICAL DATA:  18 year old female with recurrent atypical chest pain. EXAM: Cardiac/Coronary  CT TECHNIQUE: The  patient was scanned on a [employer]. FINDINGS: A 120 kV prospective scan was triggered in the descending thoracic aorta at 111 HU's. Axial non-contrast 3 mm slices were carried out through the heart. The data set was analyzed on a dedicated work station and scored using the Agatson method. Gantry rotation speed was 250 msecs and collimation was .6 mm. No beta blockade and 0.8 mg of sl NTG was given. The 3D data set was reconstructed in 5% intervals of the 67-82 % of the R-R cycle. Diastolic phases were analyzed on a dedicated work station using MPR, MIP and VRT modes. The patient received 80 cc of contrast. Aorta:  Normal size.  No calcifications.  No dissection. Aortic Valve:  Trileaflet.  No calcifications. Coronary Arteries:  Normal coronary origin.  Right dominance. RCA is a large dominant artery that gives rise to PDA and PLVB. There is no plaque. Left main is a very large artery that gives rise to LAD and LCX arteries. Left main has no plaque. LAD is a large vessel that gives rise to one small diagonal artery and has no plaque. LCX is a non-dominant artery that gives rise to two large OM branches. There is no plaque. Other findings: Normal pulmonary vein drainage into the left atrium. Normal let atrial appendage without a thrombus. Normal size of the pulmonary artery. IMPRESSION: 1. Coronary calcium score of 0. This was 0 percentile for age and sex matched control. 2. Normal coronary origin with right dominance. 3. No evidence of CAD.  Consider non-cardiac causes of chest pain. Tobias Alexander Electronically Signed   By: Tobias Alexander   On: 01/14/2018 17:17   Result Date: 01/14/2018 EXAM: OVER-READ INTERPRETATION  CT CHEST The following report is an over-read performed by radiologist Dr. Charlett Nose of [facility] on 01/14/2018. This over-read does not include interpretation of cardiac or coronary anatomy or pathology. The coronary CTA interpretation by the cardiologist is attached.  COMPARISON:  12/14/2017 FINDINGS: Vascular: Heart is normal size.  Visualized aorta normal caliber. Mediastinum/Nodes: No adenopathy in the lower mediastinum or hila. Lungs/Pleura: Visualized lungs clear.  No effusions. Upper Abdomen: Imaging into the upper abdomen shows no acute findings. Musculoskeletal: Chest wall soft tissues are unremarkable. No acute bony abnormality. IMPRESSION: No acute or significant extracardiac abnormality. Electronically Signed: By: Charlett Nose M.D.  On: 01/14/2018 13:57   US Abdomen Limited Ruq  Result Date: 01/26/2018 CLINICAL DATA:  Abdominal pain EXAM: ULTRASOUND ABDOMEN LIMITED RIGHT UPPER QUADRANT COMPARISON:  12/24/2017 abdominal CT FINDINGS: Gallbladder: No gallstones or wall thickening visualized. No sonographic Murphy sign noted by sonographer. Common bile duct: Diameter: 5 mm.  Where visualized, no filling defect. Liver: No focal lesion identified. Within normal limits in parenchymal echogenicity. Portal vein is patent on color Doppler imaging with normal direction of blood flow towards the liver. IMPRESSION: Negative right upper quadrant ultrasound. Electronically Signed   By: Marnee Spring M.D.   On: 01/26/2018 04:53    21:30: : Patient has completed her third liter of fluids & received insulin, her CBG is trending down, currently 174.  On reassessment she is nontoxic-appearing, resting comfortably, with normalized vital signs, and ability to tolerate PO fluids. She appears safe for discharge home at this time. She has been educated on importance of medication compliance and proper diet.  PCP follow-up. I discussed results, treatment plan, need for follow-up, and return precautions with the patient. Provided opportunity for questions, patient confirmed understanding and is in agreement with plan.      Desmond Lope 02/09/18 2224    Eber Hong, MD 02/10/18 [phone]

## 2018-02-09 NOTE — Discharge Instructions (Addendum)
You were seen in the ER today and found to be hyperglycemic, meaning that your blood sugar was high.  Your initial blood sugar was 322, improved to 174 following intervention the ER.  It Appears that you are very dehydrated, you were given fluids for this.  Please be sure to drink plenty of fluids, monitor your blood sugar, and follow-up with your primary care provider.  Please return to the ER for new or worsening symptoms or any other concerns.

## 2018-02-09 NOTE — ED Triage Notes (Signed)
Pt reports chest pain, abd pain, and 7 syncopal episodes today. Describes chest pain as sharp and stabbing, and a cramping pain in her abd. Pt also reports vomit x4. Pt a.o in triage nad.

## 2018-02-09 NOTE — ED Notes (Signed)
CBG 174  

## 2018-02-15 ENCOUNTER — Ambulatory Visit: Payer: Medicaid Other | Attending: Nurse Practitioner | Admitting: Nurse Practitioner

## 2018-02-15 ENCOUNTER — Encounter: Payer: Self-pay | Admitting: Nurse Practitioner

## 2018-02-15 VITALS — BP 132/90 | HR 108 | Temp 99.0°F | Ht 63.0 in | Wt 185.0 lb

## 2018-02-15 DIAGNOSIS — Z23 Encounter for immunization: Secondary | ICD-10-CM

## 2018-02-15 DIAGNOSIS — S00511A Abrasion of lip, initial encounter: Secondary | ICD-10-CM

## 2018-02-15 DIAGNOSIS — E1165 Type 2 diabetes mellitus with hyperglycemia: Secondary | ICD-10-CM | POA: Diagnosis not present

## 2018-02-15 DIAGNOSIS — Z91199 Patient's noncompliance with other medical treatment and regimen due to unspecified reason: Secondary | ICD-10-CM

## 2018-02-15 DIAGNOSIS — S00511D Abrasion of lip, subsequent encounter: Secondary | ICD-10-CM

## 2018-02-15 DIAGNOSIS — Z9119 Patient's noncompliance with other medical treatment and regimen: Secondary | ICD-10-CM | POA: Diagnosis not present

## 2018-02-15 DIAGNOSIS — E118 Type 2 diabetes mellitus with unspecified complications: Principal | ICD-10-CM

## 2018-02-15 DIAGNOSIS — IMO0002 Reserved for concepts with insufficient information to code with codable children: Secondary | ICD-10-CM

## 2018-02-15 DIAGNOSIS — S0091XA Abrasion of unspecified part of head, initial encounter: Secondary | ICD-10-CM | POA: Diagnosis not present

## 2018-02-15 LAB — POCT URINALYSIS DIP (CLINITEK)
Bilirubin, UA: NEGATIVE
Glucose, UA: 500 mg/dL — AB
Leukocytes, UA: NEGATIVE
Nitrite, UA: NEGATIVE
SPEC GRAV UA: 1.015 (ref 1.010–1.025)
Urobilinogen, UA: 0.2 E.U./dL
pH, UA: 6 (ref 5.0–8.0)

## 2018-02-15 LAB — GLUCOSE, POCT (MANUAL RESULT ENTRY)
POC Glucose: 286 mg/dl — AB (ref 70–99)
POC Glucose: 309 mg/dl — AB (ref 70–99)

## 2018-02-15 MED ORDER — INSULIN ASPART 100 UNIT/ML ~~LOC~~ SOLN
10.0000 [IU] | Freq: Once | SUBCUTANEOUS | Status: AC
  Administered 2018-02-15: 10 [IU] via SUBCUTANEOUS

## 2018-02-15 MED ORDER — GLUCOSE BLOOD VI STRP: ORAL_STRIP | 12 refills | Status: DC

## 2018-02-15 MED ORDER — INSULIN LISPRO 100 UNIT/ML ~~LOC~~ SOLN: 15.0000 [IU] | Freq: Two times a day (BID) | SUBCUTANEOUS | 99 refills | Status: DC

## 2018-02-15 MED ORDER — INSULIN GLARGINE 100 UNIT/ML SOLOSTAR PEN: 70.0000 [IU] | PEN_INJECTOR | Freq: Every day | SUBCUTANEOUS | 11 refills | Status: DC

## 2018-02-15 MED ORDER — INSULIN PEN NEEDLE 31G X 5 MM MISC: 1 refills | Status: DC

## 2018-02-15 MED ORDER — AMOXICILLIN 875 MG PO TABS: 875.0000 mg | ORAL_TABLET | Freq: Two times a day (BID) | ORAL | 0 refills | Status: DC

## 2018-02-15 NOTE — Progress Notes (Signed)
Assessment & Plan:  Nancy [name] was seen today for follow-up.  Diagnoses and all orders for this visit:  Diabetes mellitus type 2 with complications, uncontrolled (HCC) -     Glucose (CBG) -     insulin aspart (novoLOG) injection 10 Units -     Glucose (CBG) -     glucose blood (TRUE METRIX BLOOD GLUCOSE TEST) test strip; Use as instructed -     Insulin Glargine (LANTUS) 100 UNIT/ML Solostar Pen; Inject 70 Units into the skin at bedtime. -     insulin lispro (HUMALOG) 100 UNIT/ML injection; Inject 0.15 mLs (15 Units total) into the skin 2 (two) times daily with a meal for 30 days. Hold humalog if unable to eat -     Insulin Pen Needle (B-D UF III MINI PEN NEEDLES) 31G X 5 MM MISC; Use as instructed. Monitor blood glucose levels twice per day -     Microalbumin/Creatinine Ratio, Urine -     POCT URINALYSIS DIP (CLINITEK) Continue blood sugar control as discussed in office today, low carbohydrate diet, and regular physical exercise as tolerated, 150 minutes per week (30 min each day, 5 days per week, or 50 min 3 days per week). Keep blood sugar logs with fasting goal of 90-130 mg/dl, post prandial (after you eat) less than 180.  For Hypoglycemia: BS <60 and Hyperglycemia BS >400; contact the clinic ASAP. Annual eye exams and foot exams are recommended.   Need for immunization against influenza -     Flu Vaccine QUAD 36+ mos IM  Lip abrasion, subsequent encounter -     amoxicillin (AMOXIL) 875 MG tablet; Take 1 tablet (875 mg total) by mouth 2 (two) times daily. -     lidocaine (XYLOCAINE) 5 % ointment; Apply 1 application topically as needed.  Non compliance with medical treatment    Patient has been counseled on age-appropriate routine health concerns for screening and prevention. These are reviewed and up-to-date. Referrals have been placed accordingly. Immunizations are up-to-date or declined.    Subjective:   Chief Complaint  Patient presents with  . Follow-up    Patient  stated her stomach hurts sometimes.    HPI Nancy [name] 18 y.o. female presents to office today for follow up to DM. She has a history of anxiety, depression and insulin dependent diabetes. She still has not applied for the financial assistance although we have discussed this since last year.   DM TYPE 2 A1c is not well controlled. She required 10 units of insulin here in the office today due to elevated glucose.  She has a history of medication and diet noncompliance. Current medications include humalog 15 units BID, 65 units of lantus. She has not been able to take her humalog as she continues with complaints of chronic nausea and vomiting despite negative GI workup. There has been mention of abdominal migraines. Will increase lantus from 65units to 70 today. She denies any hypoglycemic symptoms. She is not monitoring her blood glucose levels consistently at home but does report lows and highs of 90-250s.  Lab Results  Component Value Date   HGBA1C 12.0 (H) 01/24/2018    Lip Abrasion Patient complains of skin tear of upper inner lip. States she went roller skating and fell on her face over a week ago. She has an abrasion on her chin as well as the right side of her face from where her skin made contact with the carpet. She did not lose consciousness.  Interventions to  date: none.   Review of Systems  Constitutional: Negative for fever, malaise/fatigue and weight loss.  HENT: Negative.  Negative for nosebleeds.   Eyes: Negative.  Negative for blurred vision, double vision and photophobia.  Respiratory: Negative.  Negative for cough and shortness of breath.   Cardiovascular: Negative.  Negative for chest pain, palpitations and leg swelling.  Gastrointestinal: Positive for constipation (chronic; BMs once a week), nausea and vomiting. Negative for heartburn.  Musculoskeletal: Positive for back pain (chronic low back). Negative for myalgias.  Skin:       SEE HPI  Neurological: Negative.   Negative for dizziness, focal weakness, seizures and headaches.  Psychiatric/Behavioral: Negative for suicidal ideas. The patient is nervous/anxious.     Past Medical History:  Diagnosis Date  . Acanthosis nigricans   . Anxiety   . CHF (congestive heart failure) ([place])   . Chronic lower back pain   . Depression   . Dyspepsia   . Obesity   . Ovarian cyst    pt is not aware of this hx (11/24/2017)  . Pre-diabetes   . 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 Premature baby   . Type II diabetes mellitus (HCC)    insulin dependant    Past Surgical History:  Procedure Laterality Date  . ABDOMINAL HERNIA REPAIR     "I was a baby"  . HERNIA REPAIR    . TONSILLECTOMY AND ADENOIDECTOMY    . WISDOM TOOTH EXTRACTION  2017    Family History  Problem Relation Age of Onset  . Diabetes Mother   . Hypertension Mother   . Obesity Mother   . Asthma Mother   . Allergic rhinitis Mother   . Eczema Mother   . Diabetes Father   . Hypertension Father   . Obesity Father   . Hyperlipidemia Father   . Hypertension Paternal Aunt   . Hypertension Maternal Grandfather   . Colon cancer Maternal Grandfather   . Diabetes Paternal Grandmother   . Obesity Paternal Grandmother   . Diabetes Paternal Grandfather   . Obesity Paternal Grandfather   . Angioedema Neg Hx   . Immunodeficiency Neg Hx   . Urticaria Neg Hx   . Stomach cancer Neg Hx   . Esophageal cancer Neg Hx     Social History Reviewed with no changes to be made today.   Outpatient Medications Prior to Visit  Medication Sig Dispense Refill  . Blood Glucose Monitoring Suppl (TRUE METRIX METER) w/Device KIT Use as instructed. Monitor blood glucose levels twice per day 1 kit 0  . pantoprazole (PROTONIX) 40 MG tablet Take 1 tablet (40 mg total) by mouth 2 (two) times daily. 60 tablet 0  . propranolol (INDERAL) 20 MG tablet Take 1 tablet (20 mg total) by mouth 2 (two) times daily. 60 tablet 0  . TRUEPLUS LANCETS 28G MISC Use as instructed.  Monitor blood glucose levels twice per day 100 each 3  . diclofenac sodium (VOLTAREN) 1 % GEL Apply 2 g topically 4 (four) times daily. (Patient taking differently: Apply 2 g topically 4 (four) times daily. to painful areas of chest and/or stomach)    . glucose blood (TRUE METRIX BLOOD GLUCOSE TEST) test strip Use as instructed 100 each 12  . Insulin Glargine (LANTUS) 100 UNIT/ML Solostar Pen Inject 65 Units into the skin at bedtime. 15 mL 11  . insulin lispro (HUMALOG) 100 UNIT/ML injection Inject 0.15 mLs (15 Units total) into the skin 2 (two) times daily with a meal for  30 days. 10 mL prn  . Insulin Pen Needle (B-D UF III MINI PEN NEEDLES) 31G X 5 MM MISC Use as instructed. Monitor blood glucose levels twice per day 90 each 1  . alum & mag hydroxide-simeth (MAALOX/MYLANTA) 200-200-20 MG/5ML suspension Take 30 mLs by mouth every 4 (four) hours as needed for indigestion or heartburn. (Patient not taking: Reported on 02/15/2018) 355 mL 0  . escitalopram (LEXAPRO) 10 MG tablet Take 20 mg by mouth daily.    Marland Kitchen glucagon 1 MG injection Use for Severe Hypoglycemia . Inject 1 mg intramuscularly if unresponsive, unable to swallow, unconscious and/or has seizure (Patient not taking: Reported on 02/15/2018) 2 each 5  . QUEtiapine (SEROQUEL) 100 MG tablet Take 300 mg by mouth at bedtime.     . ondansetron (ZOFRAN) 4 MG tablet Take 1 tablet (4 mg total) by mouth 3 (three) times daily with meals. (Patient not taking: Reported on 02/15/2018) 30 tablet 0   No facility-administered medications prior to visit.     Allergies  Allergen Reactions  . Ibuprofen Other (See Comments)    GI MD said to not take this anymore       Objective:    BP 132/90 (BP Location: Right Arm, Patient Position: Sitting, Cuff Size: Normal)   Pulse (!) 108   Temp 99 F (37.2 C) (Oral)   Ht 5' 3"  (1.6 m)   Wt 185 lb (83.9 kg)   LMP 02/11/2018   SpO2 100%   BMI 32.77 kg/m  Wt Readings from Last 3 Encounters:  02/15/18 185 lb  (83.9 kg) (96 %, Z= 1.70)*  02/01/18 183 lb 10.3 oz (83.3 kg) (95 %, Z= 1.67)*  01/24/18 183 lb 10.3 oz (83.3 kg) (95 %, Z= 1.67)*   * Growth percentiles are based on CDC (Girls, 2-20 Years) data.    Physical Exam Vitals signs and nursing note reviewed.  Constitutional:      Appearance: She is well-developed.  HENT:     Head: Normocephalic and atraumatic.     Mouth/Throat:     Lips: Lesions present.     Mouth: Oral lesions present.     Dentition: Abnormal dentition.   Neck:     Musculoskeletal: Normal range of motion.  Cardiovascular:     Rate and Rhythm: Normal rate and regular rhythm.     Heart sounds: Normal heart sounds. No murmur. No friction rub. No gallop.   Pulmonary:     Effort: Pulmonary effort is normal. No tachypnea or respiratory distress.     Breath sounds: Normal breath sounds. No decreased breath sounds, wheezing, rhonchi or rales.  Chest:     Chest wall: No tenderness.  Abdominal:     General: Bowel sounds are normal.     Palpations: Abdomen is soft.  Musculoskeletal: Normal range of motion.  Skin:    General: Skin is warm and dry.  Neurological:     Mental Status: She is alert and oriented to person, place, and time.     Coordination: Coordination normal.  Psychiatric:        Behavior: Behavior normal. Behavior is cooperative.        Thought Content: Thought content normal.        Judgment: Judgment normal.          Patient has been counseled extensively about nutrition and exercise as well as the importance of adherence with medications and regular follow-up. The patient was given clear instructions to go to ER or return to medical  center if symptoms don't improve, worsen or new problems develop. The patient verbalized understanding.   Follow-up: Return in about 3 months (around 05/16/2018) for DM.   [name], FNP-BC [facility] [place], [place]   02/19/2018, 9:52 PM

## 2018-02-16 LAB — MICROALBUMIN / CREATININE URINE RATIO
Creatinine, Urine: 87.7 mg/dL
Microalb/Creat Ratio: 25 mg/g creat (ref 0–29)
Microalbumin, Urine: 21.9 ug/mL

## 2018-02-19 MED ORDER — LIDOCAINE 5 % EX OINT: 1.0000 "application " | TOPICAL_OINTMENT | CUTANEOUS | 0 refills | Status: DC | PRN

## 2018-03-02 ENCOUNTER — Ambulatory Visit: Payer: Medicaid Other | Admitting: Gastroenterology

## 2018-03-03 ENCOUNTER — Encounter: Payer: Self-pay | Admitting: Interventional Cardiology

## 2018-03-23 ENCOUNTER — Ambulatory Visit: Payer: Self-pay | Admitting: Interventional Cardiology

## 2018-03-23 IMAGING — DX DG ABDOMEN 2V
2 series · 2 of 2 positions shown · non-contrast
Comparison: CT abdomen 10/13/2016

CLINICAL DATA: Patient with generalized abdominal pain and
vomiting.

EXAM:
ABDOMEN - 2 VIEW

[abdomen erect]
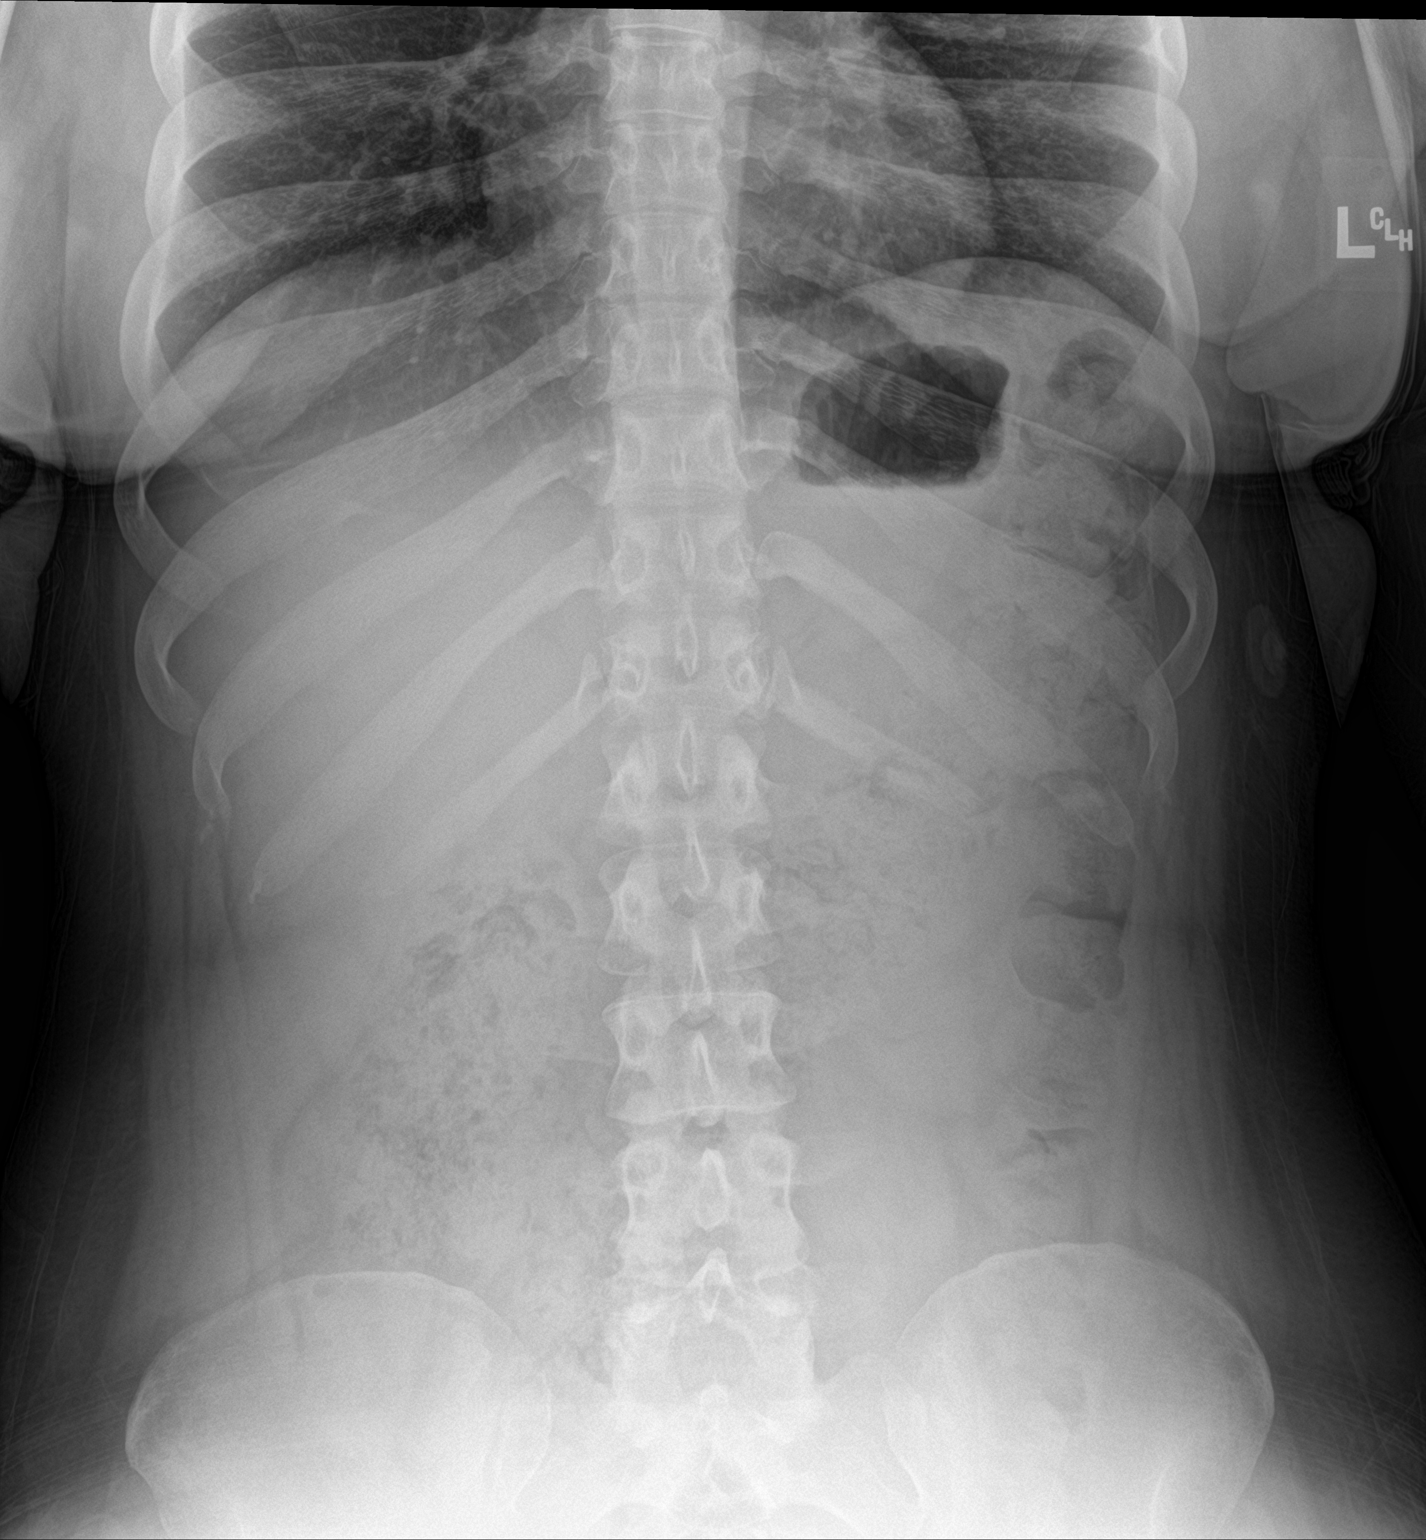

[abdomen supine]
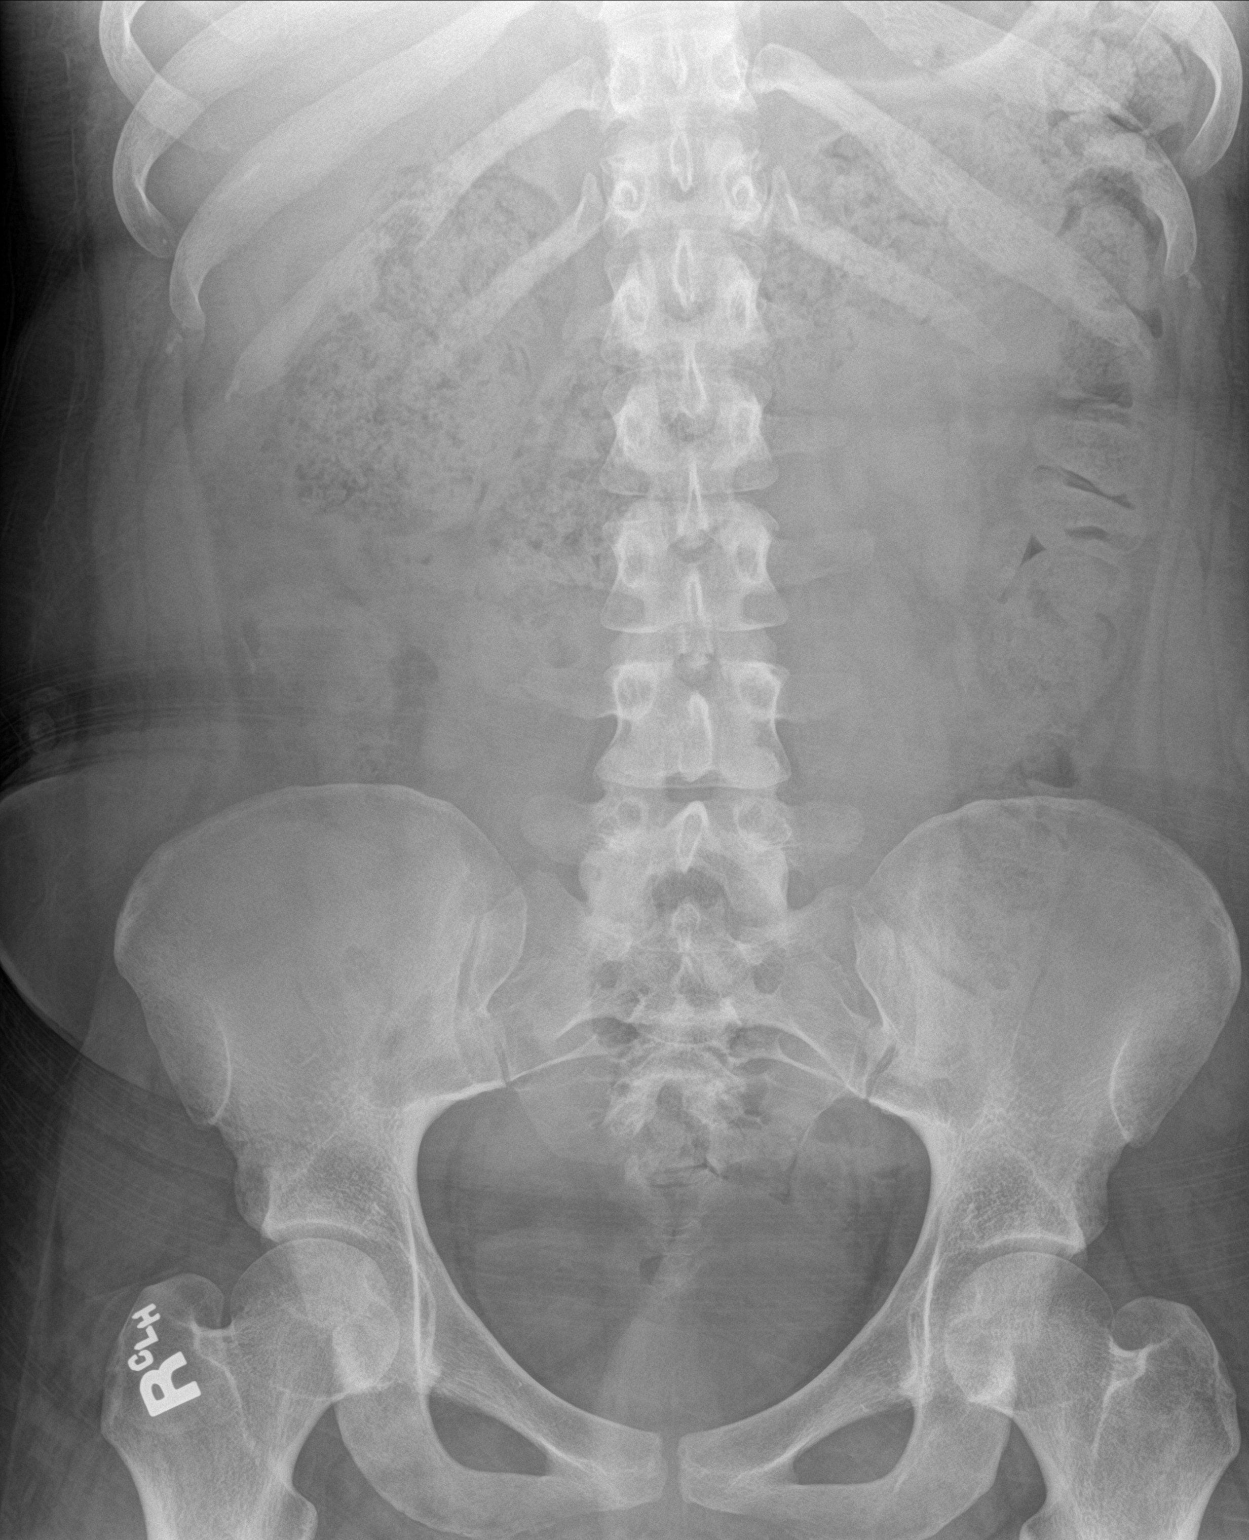

[2 of 2 positions shown; findings below may reference images not displayed]

FINDINGS: Lung bases are clear. Stool throughout the colon. Paucity of small
bowel gas. Unremarkable osseous skeleton.
IMPRESSION: Stool throughout the colon as can be seen with constipation.

Paucity of small bowel gas limits evaluation for obstruction.

## 2018-03-24 ENCOUNTER — Encounter: Payer: Self-pay | Admitting: Interventional Cardiology

## 2018-04-14 ENCOUNTER — Emergency Department (HOSPITAL_COMMUNITY)
Admission: EM | Admit: 2018-04-14 | Discharge: 2018-04-15 | Disposition: A | Discharge status: Home / Self Care | Payer: Medicaid Other | Attending: Emergency Medicine | Admitting: Emergency Medicine

## 2018-04-14 ENCOUNTER — Other Ambulatory Visit: Payer: Self-pay

## 2018-04-14 ENCOUNTER — Emergency Department (HOSPITAL_COMMUNITY): Payer: Medicaid Other

## 2018-04-14 ENCOUNTER — Encounter (HOSPITAL_COMMUNITY): Payer: Self-pay

## 2018-04-14 DIAGNOSIS — Z794 Long term (current) use of insulin: Secondary | ICD-10-CM | POA: Insufficient documentation

## 2018-04-14 DIAGNOSIS — I110 Hypertensive heart disease with heart failure: Secondary | ICD-10-CM | POA: Diagnosis not present

## 2018-04-14 DIAGNOSIS — I509 Heart failure, unspecified: Secondary | ICD-10-CM | POA: Diagnosis not present

## 2018-04-14 DIAGNOSIS — E860 Dehydration: Secondary | ICD-10-CM

## 2018-04-14 DIAGNOSIS — N390 Urinary tract infection, site not specified: Secondary | ICD-10-CM | POA: Insufficient documentation

## 2018-04-14 DIAGNOSIS — E119 Type 2 diabetes mellitus without complications: Secondary | ICD-10-CM | POA: Insufficient documentation

## 2018-04-14 DIAGNOSIS — Z79899 Other long term (current) drug therapy: Secondary | ICD-10-CM | POA: Diagnosis not present

## 2018-04-14 DIAGNOSIS — R079 Chest pain, unspecified: Secondary | ICD-10-CM | POA: Diagnosis present

## 2018-04-14 LAB — COMPREHENSIVE METABOLIC PANEL
ALT: 15 U/L (ref 0–44)
AST: 12 U/L — ABNORMAL LOW (ref 15–41)
Albumin: 4 g/dL (ref 3.5–5.0)
Alkaline Phosphatase: 77 U/L (ref 38–126)
Anion gap: 20 — ABNORMAL HIGH (ref 5–15)
BUN: 10 mg/dL (ref 6–20)
CO2: 19 mmol/L — ABNORMAL LOW (ref 22–32)
Calcium: 9.4 mg/dL (ref 8.9–10.3)
Chloride: 95 mmol/L — ABNORMAL LOW (ref 98–111)
Creatinine, Ser: 0.74 mg/dL (ref 0.44–1.00)
GFR calc Af Amer: >60 mL/min (ref >60)
GFR calc non Af Amer: >60 mL/min (ref >60)
Glucose, Bld: 476 mg/dL — ABNORMAL HIGH (ref 70–99)
Potassium: 3.1 mmol/L — ABNORMAL LOW (ref 3.5–5.1)
Sodium: 134 mmol/L — ABNORMAL LOW (ref 135–145)
Total Bilirubin: 0.4 mg/dL (ref 0.3–1.2)
Total Protein: 6.6 g/dL (ref 6.5–8.1)

## 2018-04-14 LAB — I-STAT TROPONIN, ED: Troponin i, poc: 0 ng/mL (ref 0.00–0.08)

## 2018-04-14 LAB — URINALYSIS, ROUTINE W REFLEX MICROSCOPIC
Bilirubin Urine: NEGATIVE
Glucose, UA: >=500 mg/dL — AB
Ketones, ur: 20 mg/dL — AB
Leukocytes,Ua: NEGATIVE
Nitrite: POSITIVE — AB
Protein, ur: NEGATIVE mg/dL
Specific Gravity, Urine: 1.037 — ABNORMAL HIGH (ref 1.005–1.030)
pH: 6 (ref 5.0–8.0)

## 2018-04-14 LAB — POCT I-STAT EG7
Acid-base deficit: 3 mmol/L — ABNORMAL HIGH (ref 0.0–2.0)
Bicarbonate: 20.3 mmol/L (ref 20.0–28.0)
Calcium, Ion: 1.09 mmol/L — ABNORMAL LOW (ref 1.15–1.40)
HCT: 48 % — ABNORMAL HIGH (ref 36.0–46.0)
Hemoglobin: 16.3 g/dL — ABNORMAL HIGH (ref 12.0–15.0)
O2 Saturation: 81 %
Potassium: 3.2 mmol/L — ABNORMAL LOW (ref 3.5–5.1)
Sodium: 134 mmol/L — ABNORMAL LOW (ref 135–145)
TCO2: 21 mmol/L — ABNORMAL LOW (ref 22–32)
pCO2, Ven: 31.1 mmHg — ABNORMAL LOW (ref 44.0–60.0)
pH, Ven: 7.423 (ref 7.250–7.430)
pO2, Ven: 44 mmHg (ref 32.0–45.0)

## 2018-04-14 LAB — I-STAT BETA HCG BLOOD, ED (MC, WL, AP ONLY): I-stat hCG, quantitative: <5 m[IU]/mL (ref <5)

## 2018-04-14 LAB — CBC WITH DIFFERENTIAL/PLATELET
Abs Immature Granulocytes: 0.06 10*3/uL (ref 0.00–0.07)
Basophils Absolute: 0.1 10*3/uL (ref 0.0–0.1)
Basophils Relative: 1 %
Eosinophils Absolute: 0 10*3/uL (ref 0.0–0.5)
Eosinophils Relative: 1 %
HCT: 47.7 % — ABNORMAL HIGH (ref 36.0–46.0)
Hemoglobin: 15.3 g/dL — ABNORMAL HIGH (ref 12.0–15.0)
Immature Granulocytes: 1 %
Lymphocytes Relative: 32 %
Lymphs Abs: 2.7 10*3/uL (ref 0.7–4.0)
MCH: 27.5 pg (ref 26.0–34.0)
MCHC: 32.1 g/dL (ref 30.0–36.0)
MCV: 85.6 fL (ref 80.0–100.0)
Monocytes Absolute: 0.9 10*3/uL (ref 0.1–1.0)
Monocytes Relative: 10 %
Neutro Abs: 4.8 10*3/uL (ref 1.7–7.7)
Neutrophils Relative %: 55 %
Platelets: 371 10*3/uL (ref 150–400)
RBC: 5.57 MIL/uL — ABNORMAL HIGH (ref 3.87–5.11)
RDW: 11.9 % (ref 11.5–15.5)
WBC: 8.5 10*3/uL (ref 4.0–10.5)
nRBC: 0 % (ref 0.0–0.2)

## 2018-04-14 LAB — CBG MONITORING, ED
Glucose-Capillary: 428 mg/dL — ABNORMAL HIGH (ref 70–99)
Glucose-Capillary: 333 mg/dL — ABNORMAL HIGH (ref 70–99)
Glucose-Capillary: 337 mg/dL — ABNORMAL HIGH (ref 70–99)

## 2018-04-14 LAB — LIPASE, BLOOD: Lipase: 28 U/L (ref 11–51)

## 2018-04-14 MED ORDER — SODIUM CHLORIDE 0.9 % IV BOLUS
1000.0000 mL | Freq: Once | INTRAVENOUS | Status: AC
  Administered 2018-04-14: 1000 mL via INTRAVENOUS

## 2018-04-14 MED ORDER — INSULIN ASPART 100 UNIT/ML ~~LOC~~ SOLN
4.0000 [IU] | Freq: Once | SUBCUTANEOUS | Status: AC
  Administered 2018-04-14: 22:00:00 4 [IU] via INTRAVENOUS

## 2018-04-14 MED ORDER — MORPHINE SULFATE (PF) 4 MG/ML IV SOLN
4.0000 mg | Freq: Once | INTRAVENOUS | Status: AC
  Administered 2018-04-14: 4 mg via INTRAVENOUS
  Filled 2018-04-14: qty 1

## 2018-04-14 MED ORDER — SODIUM CHLORIDE 0.9 % IV BOLUS
1000.0000 mL | Freq: Once | INTRAVENOUS | Status: AC
  Administered 2018-04-14: 22:00:00 1000 mL via INTRAVENOUS

## 2018-04-14 MED ORDER — LACTATED RINGERS IV BOLUS
1000.0000 mL | Freq: Once | INTRAVENOUS | Status: AC
  Administered 2018-04-14: 19:00:00 1000 mL via INTRAVENOUS

## 2018-04-14 MED ORDER — FENTANYL CITRATE (PF) 100 MCG/2ML IJ SOLN
100.0000 ug | Freq: Once | INTRAMUSCULAR | Status: AC
  Administered 2018-04-14: 100 ug via INTRAVENOUS
  Filled 2018-04-14: qty 2

## 2018-04-14 NOTE — ED Notes (Signed)
ED Provider at bedside. 

## 2018-04-14 NOTE — ED Provider Notes (Signed)
MSE was initiated and I personally evaluated the patient and placed orders (if any) at  6:17 PM on April 14, 2018.  Patient presents with recurrent chest pain, abdominal pain, and syncopal episodes.  She is noted to be quite tachycardic with a heart rate of around 150.  This appears to be sinus.  The chest pain and abdominal pain are recurrent but worse than typical.  She will need labs, fluids, and evaluation for DKA and other emergent conditions.  However this is not appropriate for this pod/zone and she will be moved.  The patient appears stable so that the remainder of the MSE may be completed by another provider.   Pricilla Loveless, MD 04/14/18 [phone]

## 2018-04-14 NOTE — ED Triage Notes (Signed)
Pt has had CP and abdominal pain  x 3-4 days,  Pt reports passing out more than ten times.

## 2018-04-15 ENCOUNTER — Telehealth: Payer: Self-pay | Admitting: Nurse Practitioner

## 2018-04-15 LAB — CBG MONITORING, ED: Glucose-Capillary: 240 mg/dL — ABNORMAL HIGH (ref 70–99)

## 2018-04-15 MED ORDER — CIPROFLOXACIN HCL 500 MG PO TABS: 500.0000 mg | ORAL_TABLET | Freq: Two times a day (BID) | ORAL | 0 refills | Status: DC

## 2018-04-15 MED ORDER — PROMETHAZINE HCL 25 MG PO TABS: 25.0000 mg | ORAL_TABLET | Freq: Four times a day (QID) | ORAL | 0 refills | Status: DC | PRN

## 2018-04-15 NOTE — Telephone Encounter (Signed)
Pt called in wanting to speak with nurse did not disclose reason

## 2018-04-15 NOTE — ED Provider Notes (Signed)
[facility] EMERGENCY DEPARTMENT Provider Note   CSN: [id number] Arrival date & time: 04/14/18  1750    History   Chief Complaint Chief Complaint  Patient presents with  . Chest Pain  . Abdominal Pain  . Loss of Consciousness    HPI Nancy [name] is a 18 y.o. female.     HPI Patient presents to the emergency department with chest and abdominal pain that is been ongoing for months.  She states that she feels weak and like she is been passing out all day.  She states she has had some vomiting intermittently as well.  Patient states she did not take any medications prior to arrival.  The patient denies chest pain, shortness of breath, headache,blurred vision, neck pain, fever, cough, weakness, numbness, dizziness, anorexia, edema, abdominal pain, nausea, vomiting, diarrhea, rash, back pain, dysuria, hematemesis, bloody stool, near syncope, or syncope. Past Medical History:  Diagnosis Date  . Acanthosis nigricans   . Anxiety   . CHF (congestive heart failure) ([place])   . Chronic lower back pain   . Depression   . Dyspepsia   . Obesity   . Ovarian cyst    pt is not aware of this hx (11/24/2017)  . Pre-diabetes   . 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 Premature baby   . Type II diabetes mellitus (HCC)    insulin dependant    Patient Active Problem List   Diagnosis Date Noted  . Syncope 01/30/2018  . Orthostatic hypotension 01/24/2018  . DKA (diabetic ketoacidosis) ([place]) 01/24/2018  . Chronic abdominal pain 12/24/2017  . Sinus tachycardia by electrocardiogram   . Acute lower UTI   . Uncontrolled type 2 diabetes mellitus with hyperglycemia ([place])   . Chest pain 12/19/2017  . Generalized abdominal pain 08/21/2017  . Non compliance with medical treatment 01/27/2012  . Adjustment disorder 09/16/2011  . DM (diabetes mellitus), type 1 ([place]) 09/15/2011  . Dyspepsia   . Acanthosis nigricans   . Goiter   . Obesity 06/14/2010  . Hypertension 06/14/2010     Past Surgical History:  Procedure Laterality Date  . ABDOMINAL HERNIA REPAIR     "I was a baby"  . HERNIA REPAIR    . TONSILLECTOMY AND ADENOIDECTOMY    . WISDOM TOOTH EXTRACTION  2017     OB History   No obstetric history on file.      Home Medications    Prior to Admission medications   Medication Sig Start Date End Date Taking? Authorizing Provider  diclofenac sodium (VOLTAREN) 1 % GEL Apply 2-4 g topically 3 (three) times daily as needed (for "chest" pain).    Yes [provider]  escitalopram (LEXAPRO) 10 MG tablet Take 20 mg by mouth daily.   Yes [provider]  FLUoxetine (PROZAC) 20 MG capsule Take 20 mg by mouth at bedtime.   Yes [provider]  glucagon 1 MG injection Use for Severe Hypoglycemia . Inject 1 mg intramuscularly if unresponsive, unable to swallow, unconscious and/or has seizure 07/20/13 12/19/25 Yes Renato Shin, MD  Insulin Glargine (LANTUS) 100 UNIT/ML Solostar Pen Inject 70 Units into the skin at bedtime. 02/15/18  Yes [name], NP  insulin lispro (HUMALOG) 100 UNIT/ML injection Inject 0.15 mLs (15 Units total) into the skin 2 (two) times daily with a meal for 30 days. Hold humalog if unable to eat Patient taking differently: Inject 15 Units into the skin See admin instructions. Hold humalog if unable to einm Inject  Units into the skin 2 times a day with meals and HOLD IF UNABLE TO EAT 02/15/18 04/14/18 Yes [name], NP  pantoprazole (PROTONIX) 40 MG tablet Take 1 tablet (40 mg total) by mouth 2 (two) times daily. 02/01/18  Yes Geradine Girt, DO  propranolol (INDERAL) 20 MG tablet Take 1 tablet (20 mg total) by mouth 2 (two) times daily. 02/01/18  Yes Vann, Jessica U, DO  QUEtiapine (SEROQUEL) 100 MG tablet Take 300 mg by mouth at bedtime.    Yes [provider]  alum & mag hydroxide-simeth (MAALOX/MYLANTA) 200-200-20 MG/5ML suspension Take 30 mLs by mouth every 4 (four) hours as needed for indigestion or  heartburn. Patient not taking: Reported on 04/14/2018 01/26/18   Aline August, MD  amoxicillin (AMOXIL) 875 MG tablet Take 1 tablet (875 mg total) by mouth 2 (two) times daily. Patient not taking: Reported on 04/14/2018 02/15/18   [name], NP  Blood Glucose Monitoring Suppl (TRUE METRIX METER) w/Device KIT Use as instructed. Monitor blood glucose levels twice per day 12/02/17   [name], NP  glucose blood (TRUE METRIX BLOOD GLUCOSE TEST) test strip Use as instructed 02/15/18   [name], NP  Insulin Pen Needle (B-D UF III MINI PEN NEEDLES) 31G X 5 MM MISC Use as instructed. Monitor blood glucose levels twice per day 02/15/18   [name], NP  lidocaine (XYLOCAINE) 5 % ointment Apply 1 application topically as needed. 02/19/18   [name], NP  TRUEPLUS LANCETS 28G MISC Use as instructed. Monitor blood glucose levels twice per day 12/02/17   [name], NP    Family History Family History  Problem Relation Age of Onset  . Diabetes Mother   . Hypertension Mother   . Obesity Mother   . Asthma Mother   . Allergic rhinitis Mother   . Eczema Mother   . Diabetes Father   . Hypertension Father   . Obesity Father   . Hyperlipidemia Father   . Hypertension Paternal Aunt   . Hypertension Maternal Grandfather   . Colon cancer Maternal Grandfather   . Diabetes Paternal Grandmother   . Obesity Paternal Grandmother   . Diabetes Paternal Grandfather   . Obesity Paternal Grandfather   . Angioedema Neg Hx   . Immunodeficiency Neg Hx   . Urticaria Neg Hx   . Stomach cancer Neg Hx   . Esophageal cancer Neg Hx     Social History Social History   Tobacco Use  . Smoking status: Never Smoker  . Smokeless tobacco: Never Used  Substance Use Topics  . Alcohol use: No    Alcohol/week: 0.0 standard drinks  . Drug use: No     Allergies   Ibuprofen   Review of Systems Review of Systems All other systems negative except as documented in the HPI. All  pertinent positives and negatives as reviewed in the HPI.  Physical Exam Updated Vital Signs BP 110/76   Pulse (!) 101   Temp 98.4 F (36.9 C) (Oral)   Resp 17   Ht 5' 3"  (1.6 m)   Wt 84.4 kg   LMP 04/07/2018 (Exact Date)   SpO2 99%   BMI 32.95 kg/m   Physical Exam Vitals signs and nursing note reviewed.  Constitutional:      General: She is not in acute distress.    Appearance: She is well-developed.  HENT:     Head: Normocephalic and atraumatic.  Eyes:  Pupils: Pupils are equal, round, and reactive to light.  Neck:     Musculoskeletal: Normal range of motion and neck supple.  Cardiovascular:     Rate and Rhythm: Normal rate and regular rhythm.     Heart sounds: Normal heart sounds. No murmur. No friction rub. No gallop.   Pulmonary:     Effort: Pulmonary effort is normal. No respiratory distress.     Breath sounds: Normal breath sounds. No wheezing.  Abdominal:     General: Bowel sounds are normal. There is no distension.     Palpations: Abdomen is soft.     Tenderness: There is no abdominal tenderness. There is no guarding or rebound.  Skin:    General: Skin is warm and dry.     Capillary Refill: Capillary refill takes less than 2 seconds.     Findings: No erythema or rash.  Neurological:     Mental Status: She is alert and oriented to person, place, and time.     Motor: No abnormal muscle tone.     Coordination: Coordination normal.  Psychiatric:        Behavior: Behavior normal.      ED Treatments / Results  Labs (all labs ordered are listed, but only abnormal results are displayed) Labs Reviewed  CBC WITH DIFFERENTIAL/PLATELET - Abnormal; Notable for the following components:      Result Value   RBC 5.57 (*)    Hemoglobin 15.3 (*)    HCT 47.7 (*)    All other components within normal limits  COMPREHENSIVE METABOLIC PANEL - Abnormal; Notable for the following components:   Sodium 134 (*)    Potassium 3.1 (*)    Chloride 95 (*)    CO2 19 (*)     Glucose, Bld 476 (*)    AST 12 (*)    Anion gap 20 (*)    All other components within normal limits  URINALYSIS, ROUTINE W REFLEX MICROSCOPIC - Abnormal; Notable for the following components:   APPearance CLOUDY (*)    Specific Gravity, Urine 1.037 (*)    Glucose, UA >=500 (*)    Hgb urine dipstick LARGE (*)    Ketones, ur 20 (*)    Nitrite POSITIVE (*)    Bacteria, UA MANY (*)    All other components within normal limits  CBG MONITORING, ED - Abnormal; Notable for the following components:   Glucose-Capillary 428 (*)    All other components within normal limits  POCT I-STAT EG7 - Abnormal; Notable for the following components:   pCO2, Ven 31.1 (*)    TCO2 21 (*)    Acid-base deficit 3.0 (*)    Sodium 134 (*)    Potassium 3.2 (*)    Calcium, Ion 1.09 (*)    HCT 48.0 (*)    Hemoglobin 16.3 (*)    All other components within normal limits  CBG MONITORING, ED - Abnormal; Notable for the following components:   Glucose-Capillary 333 (*)    All other components within normal limits  CBG MONITORING, ED - Abnormal; Notable for the following components:   Glucose-Capillary 337 (*)    All other components within normal limits  LIPASE, BLOOD  I-STAT TROPONIN, ED  I-STAT BETA HCG BLOOD, ED ([facility], [facility], [facility] ONLY)  CBG MONITORING, ED    EKG EKG Interpretation  Date/Time:  Wednesday April 14 2018 18:02:24 EDT Ventricular Rate:  142 PR Interval:  118 QRS Duration: 80 QT Interval:  364 QTC Calculation: 559 R Axis:  74 Text Interpretation:  Sinus tachycardia Right atrial enlargement Borderline ECG rate is faster compared to Feb 2020 Confirmed by Sherwood Gambler [phone]) on 04/14/2018 6:10:45 PM   Radiology Dg Chest Portable 1 View  Result Date: 04/14/2018 CLINICAL DATA:  Chest pain and shortness of breath EXAM: PORTABLE CHEST 1 VIEW COMPARISON:  February 09, 2018 FINDINGS: The lungs are clear. Heart size and pulmonary vascularity are normal. No adenopathy. No bone lesions. IMPRESSION: No  edema or consolidation. Electronically Signed   By: Lowella Grip III M.D.   On: 04/14/2018 19:15    Procedures Procedures (including critical care time)  Medications Ordered in ED Medications  lactated ringers bolus 1,000 mL (0 mLs Intravenous Stopped 04/14/18 2058)  sodium chloride 0.9 % bolus 1,000 mL (0 mLs Intravenous Stopped 04/14/18 2058)  morphine 4 MG/ML injection 4 mg (4 mg Intravenous Given 04/14/18 1856)  fentaNYL (SUBLIMAZE) injection 100 mcg (100 mcg Intravenous Given 04/14/18 2055)  sodium chloride 0.9 % bolus 1,000 mL (1,000 mLs Intravenous New Bag/Given 04/14/18 2145)  sodium chloride 0.9 % bolus 1,000 mL (1,000 mLs Intravenous New Bag/Given 04/14/18 2203)  insulin aspart (novoLOG) injection 4 Units (4 Units Intravenous Given 04/14/18 2203)     Initial Impression / Assessment and Plan / ED Course  I have reviewed the triage vital signs and the nursing notes.  Pertinent labs & imaging results that were available during my care of the patient were reviewed by me and considered in my medical decision making (see chart for details).        Patient does not appear to be in DKA I feel like she is dehydrated based on her laboratory testing.  I feel like the anion gap is due to lactic acidosis from the dehydration.  Patient will be a advised to return here as needed.  Her heart rate has improved.  She is been hydrated with 3 L of normal saline.  Patient is advised to follow-up with her primary doctor.  Final Clinical Impressions(s) / ED Diagnoses   Final diagnoses:  None    ED Discharge Orders    None       Dalia Heading, PA-C 04/15/18 0005    Lennice Sites, DO 04/15/18 0013

## 2018-04-15 NOTE — Telephone Encounter (Signed)
Patients call returned.  Patient identified by name and date of birth.  Patient states she has chest and abdominal pain for the last four (4) days.  Patient did go to ED yesterday where she was assessed.  EKG showed tachycardia.  Patient had a urinalysis that was noted as a urinary tract infection. Patient prescribed medications and told to call PCP.  Patient advised to continue medications and take tylenol, which she is not allergic to, and to drink plenty of water.  Patient advised that triage would attempt to move her appointment to sooner than scheduled.  Patient advised if chest pains become constant, with shortness of breath to go to the ED or urgent care. Patient acknowledged understanding of instructions.

## 2018-04-15 NOTE — Discharge Instructions (Addendum)
Return here as needed.  Follow-up with your primary doctor.  You do have a urinary tract infection.  Increase your fluid intake.

## 2018-04-19 NOTE — Telephone Encounter (Signed)
NOTED. Agree with instructions given to patient.

## 2018-04-26 NOTE — Progress Notes (Signed)
Subjective:    Patient ID: Nancy [name], female    DOB: 2000/05/05, 18 y.o.   MRN: [id number] Virtual Visit via Video Note  I connected with@ on 04/27/18 at@ by a video enabled telemedicine application and verified that I am speaking with the correct person using two identifiers.   Consent:  I discussed the limitations, risks, security and privacy concerns of performing an evaluation and management service by video visit and the availability of in person appointments. I also discussed with the patient that there may be a patient responsible charge related to this service. The patient expressed understanding and agreed to proceed.  Location of patient: The patient was at home Location of provider: I was in my office Persons participating in the televisit with the patient.    No one else was on the call   History of Present Illness: This is an 18 year old female who I previously saw in December 2019 and has been subsequently followed up by her primary provider Bertram Denver for type 1 diabetes and recurrent abdominal and chest pain.  The patient's had what is been described as abdominal migraines with a work-up that was negative for gastroparesis including a negative HIDA scan.  This patient has been to the emergency room or been hospitalized on multiple occasions since January.  She was last seen by provider Meredeth Ide on February 2020.  At that time the patient's insulin was adjusted to 70 units Lantus daily and Humalog 15 units twice daily at meals.  The patient had an elevated microalbumin and her hemoglobin A1c has been elevated as well.  She had a lip abrasion and was given an antibiotic for this and was given a flu vaccine.  Note we had previously obtain a cardiac CT which was negative for coronary artery disease.  The patient is also had recurrent urinary tract symptoms based upon the fact that she gets volume depleted and dehydrated and has orthostatic hypotension and decreased urine  output.  In addition to this because she has significant inability to take food in her diabetes is been difficult to control.  This has resulted that she is now tending more towards being a type I diabetic than type II and now is quite insulin-dependent.  I did connect with this patient today by WebEx video.  I confirmed that the patient was the only one in the room at the time.  Prior to the visit I spent 15 minutes reviewing the patient's entire record since I last saw the patient in December 2019.  Included in this was a recent emergency room visit April 14, 2018.  Documentation from that visit is as follows.  Recent ED visit 4/8 for UTI and: Patient presents to the emergency department with chest and abdominal pain that is been ongoing for months.  She states that she feels weak and like she is been passing out all day.  She states she has had some vomiting intermittently as well.  Patient states she did not take any medications prior to arrival.  The patient denies chest pain, shortness of breath, headache,blurred vision, neck pain, fever, cough, weakness, numbness, dizziness, anorexia, edema, abdominal pain, nausea, vomiting, diarrhea, rash, back pain, dysuria, hematemesis, bloody stool, near syncope, or syncope.  Medications Ordered in ED Medications lactated ringers bolus 1,000 mL (0 mLs Intravenous Stopped 04/14/18 2058) sodium chloride 0.9 % bolus 1,000 mL (0 mLs Intravenous Stopped 04/14/18 2058) morphine 4 MG/ML injection 4 mg (4 mg Intravenous Given 04/14/18 1856) fentaNYL (SUBLIMAZE) injection  100 mcg (100 mcg Intravenous Given 04/14/18 2055) sodium chloride 0.9 % bolus 1,000 mL (1,000 mLs Intravenous New Bag/Given 04/14/18 2145) sodium chloride 0.9 % bolus 1,000 mL (1,000 mLs Intravenous New Bag/Given 04/14/18 2203) insulin aspart (novoLOG) injection 4 Units (4 Units Intravenous Given 04/14/18 2203)  Patient does not appear to be in DKA I feel like she is dehydrated based on her laboratory  testing.  I feel like the anion gap is due to lactic acidosis from the dehydration.  Patient will be a advised to return here as needed.  Her heart rate has improved.  She is been hydrated with 3 L of normal saline.  Patient is advised to follow-up with her primary doctor. The patient was given a prescription for ciprofloxacin and promethazine.  The patient states that since that visit her urinary symptoms have improved.  She is having no dysuria.  However her chest pain continues and is on the left side of the chest radiating into the back.  Is an 8 out of 10 on a scale.  She still also has hypertension noted at home with blood pressures in the 150/97 range and heart rate in the 140+ range.  She states her blood sugars run fasting 145-1 55 and after eating 256.  She has emesis with every meal.  She is having difficulty keeping fluids down.  She states Phenergan, Reglan, and Zofran have not been helpful.  She has a pending visit with her gastroenterologist.  She states the lidocaine patch and the diclofenac gel have not helped the chest pain.  She states the Inderal given in the hospital back in January for potential abdominal migraine was of no benefit and she is now out of this.  She states she is out of Lexapro and Prozac and needs refills on both.  Note her last hemoglobin A1c was 12 in January 2020.  Also note she has findings in her urinalysis of budding yeast as well as bacteria.  Her last menstrual period was on April 1     Review of Systems  Constitutional: Positive for activity change, diaphoresis and fatigue. Negative for fever.  HENT: Negative.   Eyes: Negative.   Respiratory: Positive for shortness of breath. Negative for cough, choking, chest tightness, wheezing and stridor.   Cardiovascular: Positive for chest pain and palpitations. Negative for leg swelling.  Gastrointestinal: Positive for abdominal distention, abdominal pain, nausea and vomiting. Negative for blood in stool and diarrhea.   Endocrine: Negative for polyuria.  Genitourinary: Negative.   Musculoskeletal: Positive for back pain.  Skin: Negative.   Neurological: Positive for dizziness, syncope, weakness and headaches.  Psychiatric/Behavioral: Negative.    Observations/Objective: I was able to observe the patient through the video and I asked to look at her abdomen which did not appear distended.  The patient was not in any acute distress.  Her skin turgor appeared normal.    cxr 4/8 IMPRESSION: No edema or consolidation. Labs Reviewed  CBC WITH DIFFERENTIAL/PLATELET - Abnormal; Notable for the following components:      Result Value    RBC 5.57 (*)    Hemoglobin 15.3 (*)    HCT 47.7 (*)    All other components within normal limits  COMPREHENSIVE METABOLIC PANEL - Abnormal; Notable for the following components:   Sodium 134 (*)    Potassium 3.1 (*)    Chloride 95 (*)    CO2 19 (*)    Glucose, Bld 476 (*)    AST 12 (*)  Anion gap 20 (*)    All other components within normal limits  URINALYSIS, ROUTINE W REFLEX MICROSCOPIC - Abnormal; Notable for the following components:   APPearance CLOUDY (*)    Specific Gravity, Urine 1.037 (*)    Glucose, UA >=500 (*)    Hgb urine dipstick LARGE (*)    Ketones, ur 20 (*)    Nitrite POSITIVE (*)    Bacteria, UA MANY (*)    All other components within normal limits  CBG MONITORING, ED - Abnormal; Notable for the following components:   Glucose-Capillary 428 (*)    All other components within normal limits  POCT I-STAT EG7 - Abnormal; Notable for the following components:   pCO2, Ven 31.1 (*)    TCO2 21 (*)    Acid-base deficit 3.0 (*)    Sodium 134 (*)    Potassium 3.2 (*)    Calcium, Ion 1.09 (*)    HCT 48.0 (*)    Hemoglobin 16.3 (*)    All other components within normal limits  CBG MONITORING, ED - Abnormal; Notable for the following components:   Glucose-Capillary 333 (*)    All other  components within normal limits  CBG MONITORING, ED - Abnormal; Notable for the following components:   Glucose-Capillary 337 (*)    All other components within normal limits    Assessment and Plan: #1 former type II now tending towards type 1 diabetes insulin-dependent with significant hyperglycemia and poor glycemic control.  The patient tends towards diabetic ketoacidosis.  During the last emergency room visit she was acidotic but was not admitted after several liters of fluid given IV.  I am concerned about her metabolic status today as well.  Plan will be to maintain insulin at current dosing and will plan to have the patient come in for laboratory to include a complete metabolic panel and CBC as well as urinalysis.  Based on this we may recommend returning the patient to the emergency room for further IV fluids and evaluations  #2 recurrent abdominal pain with nausea and vomiting.  The patient has been resistant to primary care level interventions.  She has a gastroenterologist and we encouraged the patient to call the office today for a telemetry visit with gastroenterology.  #3 orthostatic hypotension and recurrent syncope which appears to continue to be an issue in this patient.  Again have recommended pushing fluids and yet may require recurrent hospitalization  #4 urinary tract infection appears to be quiesced sent at this time and no further antibiotics are indicated  #5 history of mood disorder and depression   I have refilled the Lexapro and the Prozac for this patient  Follow Up Instructions: The patient understands to come in today for labs and to contact gastroenterology for a visit.  Depending upon the results of the laboratory or gastroenterology recommendations the patient may need to return to the emergency room for further evaluation and possible admission   I discussed the assessment and treatment plan with the patient. The patient was provided an opportunity to ask  questions and all were answered. The patient agreed with the plan and demonstrated an understanding of the instructions.   The patient was advised to call back or seek an in-person evaluation if the symptoms worsen or if the condition fails to improve as anticipated.  I provided 45 minutes of non-face-to-face time during this encounter  including  median intraservice time , review of notes, labs, imaging, medications  and explaining diagnosis and management to  the patient .    Asencion Noble, MD

## 2018-04-27 ENCOUNTER — Other Ambulatory Visit: Payer: Self-pay

## 2018-04-27 ENCOUNTER — Encounter: Payer: Self-pay | Admitting: Critical Care Medicine

## 2018-04-27 ENCOUNTER — Ambulatory Visit: Payer: Medicaid Other | Attending: Critical Care Medicine | Admitting: Critical Care Medicine

## 2018-04-27 DIAGNOSIS — R55 Syncope and collapse: Secondary | ICD-10-CM | POA: Diagnosis not present

## 2018-04-27 DIAGNOSIS — I951 Orthostatic hypotension: Secondary | ICD-10-CM

## 2018-04-27 DIAGNOSIS — Z91199 Patient's noncompliance with other medical treatment and regimen due to unspecified reason: Secondary | ICD-10-CM

## 2018-04-27 DIAGNOSIS — I10 Essential (primary) hypertension: Secondary | ICD-10-CM

## 2018-04-27 DIAGNOSIS — E1069 Type 1 diabetes mellitus with other specified complication: Secondary | ICD-10-CM

## 2018-04-27 DIAGNOSIS — R000 Tachycardia, unspecified: Secondary | ICD-10-CM

## 2018-04-27 DIAGNOSIS — N390 Urinary tract infection, site not specified: Secondary | ICD-10-CM

## 2018-04-27 DIAGNOSIS — E1065 Type 1 diabetes mellitus with hyperglycemia: Secondary | ICD-10-CM

## 2018-04-27 DIAGNOSIS — F4329 Adjustment disorder with other symptoms: Secondary | ICD-10-CM

## 2018-04-27 DIAGNOSIS — R1084 Generalized abdominal pain: Secondary | ICD-10-CM

## 2018-04-27 DIAGNOSIS — Z9119 Patient's noncompliance with other medical treatment and regimen: Secondary | ICD-10-CM

## 2018-04-27 MED ORDER — FLUOXETINE HCL 20 MG PO CAPS: 20.0000 mg | ORAL_CAPSULE | Freq: Every day | ORAL | 4 refills | Status: DC

## 2018-04-27 MED ORDER — ESCITALOPRAM OXALATE 10 MG PO TABS: 20.0000 mg | ORAL_TABLET | Freq: Every day | ORAL | 3 refills | Status: DC

## 2018-04-27 NOTE — Progress Notes (Signed)
Called patient to initiate their telephone visit with provider Dr. Delford Field. Verified date of birth. Patient has c/o sharp, stabbing L sided chest pain that radiates to her L side of her body. Currently rates pain as 8/10.Marland Kitchen KWalker, CMA.

## 2018-05-12 IMAGING — CR DG CHEST 2V
2 series · 2 of 2 positions shown · non-contrast
Comparison: None

CLINICAL DATA: Nausea, vomiting, abdominal pain, worsening
RIGHT-side chest pain radiating to LEFT for 3 days, history diabetes
mellitus, hypertension

EXAM:
CHEST - 2 VIEW

[w chest pa]
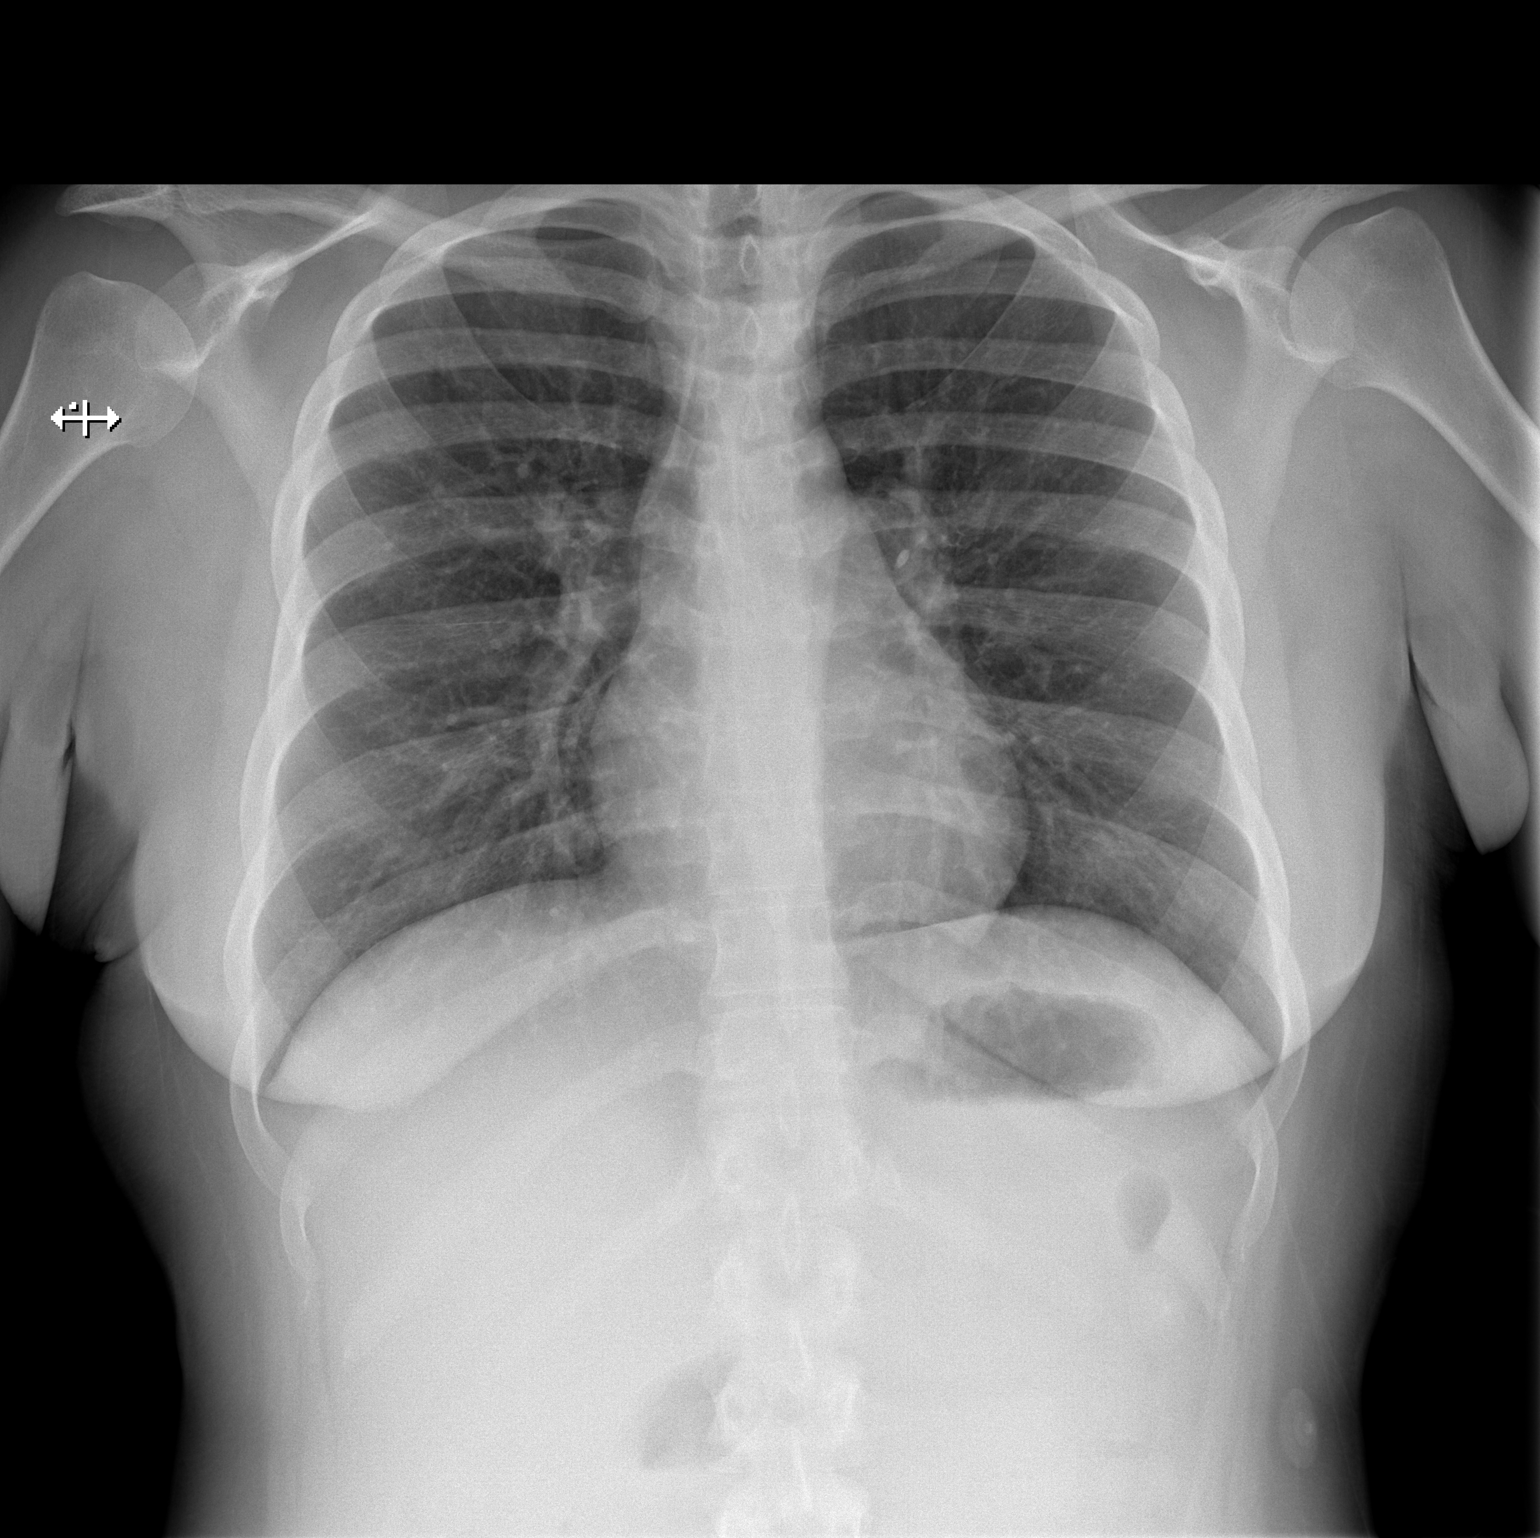

[w chest lat]
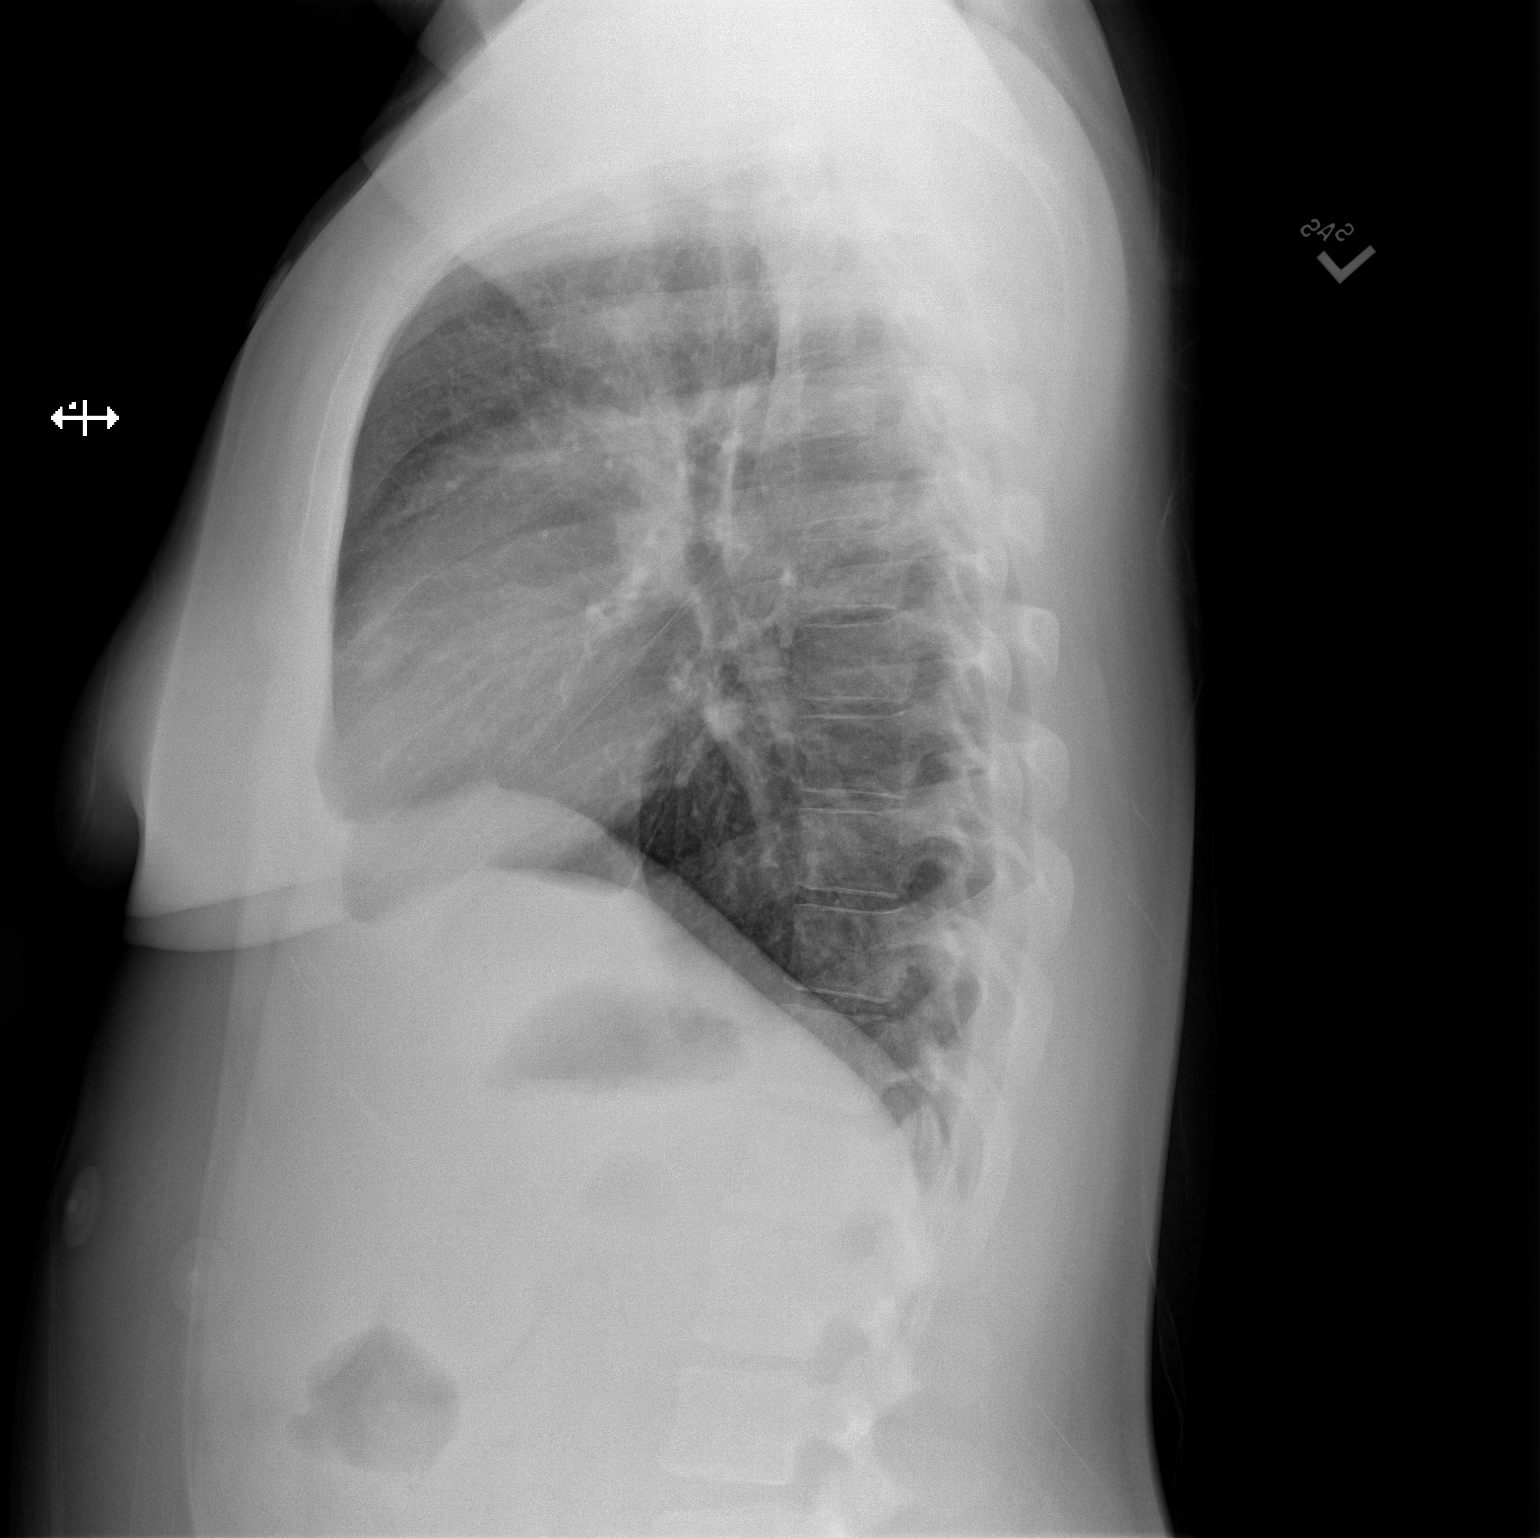

[2 of 2 positions shown; findings below may reference images not displayed]

FINDINGS: Normal heart size, mediastinal contours, and pulmonary vascularity.

Lungs clear.

No pleural effusion or pneumothorax.

Bones unremarkable.
IMPRESSION: Normal exam.

## 2018-05-17 ENCOUNTER — Ambulatory Visit: Payer: Self-pay | Admitting: Nurse Practitioner

## 2018-05-25 ENCOUNTER — Other Ambulatory Visit: Payer: Self-pay

## 2018-05-25 ENCOUNTER — Ambulatory Visit: Payer: Medicaid Other | Attending: Nurse Practitioner | Admitting: Nurse Practitioner

## 2018-05-25 ENCOUNTER — Encounter: Payer: Self-pay | Admitting: Nurse Practitioner

## 2018-05-25 DIAGNOSIS — F419 Anxiety disorder, unspecified: Secondary | ICD-10-CM

## 2018-05-25 DIAGNOSIS — E11649 Type 2 diabetes mellitus with hypoglycemia without coma: Secondary | ICD-10-CM

## 2018-05-25 DIAGNOSIS — F329 Major depressive disorder, single episode, unspecified: Secondary | ICD-10-CM

## 2018-05-25 MED ORDER — FLUOXETINE HCL 20 MG PO CAPS: 20.0000 mg | ORAL_CAPSULE | Freq: Every day | ORAL | 4 refills | Status: DC

## 2018-05-25 MED ORDER — ESCITALOPRAM OXALATE 10 MG PO TABS: 20.0000 mg | ORAL_TABLET | Freq: Every day | ORAL | 3 refills | Status: DC

## 2018-05-25 NOTE — Progress Notes (Signed)
Virtual Visit via Telephone Note Due to national recommendations of social distancing due to COVID 19, telehealth visit is felt to be most appropriate for this patient at this time.  I discussed the limitations, risks, security and privacy concerns of performing an evaluation and management service by telephone and the availability of in person appointments. I also discussed with the patient that there may be a patient responsible charge related to this service. The patient expressed understanding and agreed to proceed.    I connected with Nancy [name] on 05/25/18  at  10:50 AM EDT  EDT by telephone and verified that I am speaking with the correct person using two identifiers.   Consent I discussed the limitations, risks, security and privacy concerns of performing an evaluation and management service by telephone and the availability of in person appointments. I also discussed with the patient that there may be a patient responsible charge related to this service. The patient expressed understanding and agreed to proceed.   Location of Patient: Private  Residence    Location of Provider: [facility]    Persons participating in Telemedicine visit: Bertram Denver FNP-BC YY [place] CMA Nancy [name]    History of Present Illness: Telemedicine visit for: Hypoglycemia  She has a history of poorly controlled IDDM. Reports glucose readings 60-70s.  She has problems with her teeth due to poor dentition including multiple caries and broken teeth down into the gumline. Difficult to eat due to dental pain. Eating late at night and then giving herself lantus 70 units and humalog 15 units. Only eating foods like applesauce or soft fruits. I instructed her that if she is going to only be able to eat small amounts of food then she will need to add foods like no to low sodium soups. I recommended she stop giving herself humalog if she is eating small amounts of food at  at time. She is to only give herself humalog with a full meal. Also I instructed her to decrease her nighttime lantus by 2 units every 3 days until fasting glucose is 90-120.   She is uninsured and can not afford an endocrinologist. Somewhat of a difficult situation as she is having dental issues and also was referred to Gastroenterology for GI issues related to emesis with every meal.  Lab Results  Component Value Date   HGBA1C 12.0 (H) 01/24/2018      Past Medical History:  Diagnosis Date  . Acanthosis nigricans   . Anxiety   . CHF (congestive heart failure) (HCC)   . Chronic lower back pain   . Depression   . Dyspepsia   . Obesity   . Ovarian cyst    pt is not aware of this hx (11/24/2017)  . Pre-diabetes   . 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 Premature baby   . Type II diabetes mellitus (HCC)    insulin dependant    Past Surgical History:  Procedure Laterality Date  . ABDOMINAL HERNIA REPAIR     "I was a baby"  . HERNIA REPAIR    . TONSILLECTOMY AND ADENOIDECTOMY    . WISDOM TOOTH EXTRACTION  2017    Family History  Problem Relation Age of Onset  . Diabetes Mother   . Hypertension Mother   . Obesity Mother   . Asthma Mother   . Allergic rhinitis Mother   . Eczema Mother   . Diabetes Father   . Hypertension Father   . Obesity Father   .  Hyperlipidemia Father   . Hypertension Paternal Aunt   . Hypertension Maternal Grandfather   . Colon cancer Maternal Grandfather   . Diabetes Paternal Grandmother   . Obesity Paternal Grandmother   . Diabetes Paternal Grandfather   . Obesity Paternal Grandfather   . Angioedema Neg Hx   . Immunodeficiency Neg Hx   . Urticaria Neg Hx   . Stomach cancer Neg Hx   . Esophageal cancer Neg Hx     Social History   Socioeconomic History  . Marital status: Single    Spouse name: Not on file  . Number of children: 0  . Years of education: Not on file  . Highest education level: Not on file  Occupational History  . Occupation:  Admission  Social Needs  . Financial resource strain: Not on file  . Food insecurity:    Worry: Not on file    Inability: Not on file  . Transportation needs:    Medical: Not on file    Non-medical: Not on file  Tobacco Use  . Smoking status: Never Smoker  . Smokeless tobacco: Never Used  Substance and Sexual Activity  . Alcohol use: No    Alcohol/week: 0.0 standard drinks  . Drug use: No  . Sexual activity: Never  Lifestyle  . Physical activity:    Days per week: Not on file    Minutes per session: Not on file  . Stress: Not on file  Relationships  . Social connections:    Talks on phone: Not on file    Gets together: Not on file    Attends religious service: Not on file    Active member of club or organization: Not on file    Attends meetings of clubs or organizations: Not on file    Relationship status: Not on file  Other Topics Concern  . Not on file  Social History Narrative   Lives with mom and mom's girlfriend. Dad involved. 9th grade [employer].     Observations/Objective: Awake, alert and oriented x 3   Review of Systems  Constitutional: Negative for fever, malaise/fatigue and weight loss.  HENT: Negative.  Negative for nosebleeds.   Eyes: Negative.  Negative for blurred vision, double vision and photophobia.  Respiratory: Negative.  Negative for cough and shortness of breath.   Cardiovascular: Negative.  Negative for chest pain, palpitations and leg swelling.  Gastrointestinal: Positive for nausea and vomiting. Negative for abdominal pain, blood in stool, constipation, diarrhea, heartburn and melena.  Musculoskeletal: Negative.  Negative for myalgias.  Neurological: Negative.  Negative for dizziness, focal weakness, seizures and headaches.  Psychiatric/Behavioral: Positive for depression. Negative for hallucinations, memory loss, substance abuse and suicidal ideas. The patient is nervous/anxious and has insomnia.     Assessment and Plan: Nancy [name]  was seen today for follow-up.  Diagnoses and all orders for this visit:  Hypoglycemia associated with diabetes (HCC) Lantus to be decreased every 3 days by 2 units until fasting blood glucose 90-120  Anxiety and depression -     escitalopram (LEXAPRO) 10 MG tablet; Take 2 tablets (20 mg total) by mouth daily. -     FLUoxetine (PROZAC) 20 MG capsule; Take 1 capsule (20 mg total) by mouth at bedtime. She denies any current thoughts of self harm.      Follow Up Instructions Return in about 1 week (around 06/01/2018) for f/u hypoglycemia and lantus changes.     I discussed the assessment and treatment plan with the patient. The  patient was provided an opportunity to ask questions and all were answered. The patient agreed with the plan and demonstrated an understanding of the instructions.   The patient was advised to call back or seek an in-person evaluation if the symptoms worsen or if the condition fails to improve as anticipated.  I provided 29 minutes of non-face-to-face time during this encounter including median intraservice time, reviewing previous notes, labs, imaging, medications and explaining diagnosis and management.  Claiborne RiggZelda W Fleming, FNP-BC

## 2018-06-02 ENCOUNTER — Other Ambulatory Visit: Payer: Self-pay

## 2018-06-02 ENCOUNTER — Ambulatory Visit: Payer: Medicaid Other | Attending: Nurse Practitioner | Admitting: Nurse Practitioner

## 2018-06-02 ENCOUNTER — Encounter: Payer: Self-pay | Admitting: Nurse Practitioner

## 2018-06-02 DIAGNOSIS — Z8349 Family history of other endocrine, nutritional and metabolic diseases: Secondary | ICD-10-CM | POA: Insufficient documentation

## 2018-06-02 DIAGNOSIS — G8929 Other chronic pain: Secondary | ICD-10-CM | POA: Diagnosis not present

## 2018-06-02 DIAGNOSIS — R0602 Shortness of breath: Secondary | ICD-10-CM | POA: Diagnosis not present

## 2018-06-02 DIAGNOSIS — E669 Obesity, unspecified: Secondary | ICD-10-CM | POA: Insufficient documentation

## 2018-06-02 DIAGNOSIS — Z135 Encounter for screening for eye and ear disorders: Secondary | ICD-10-CM

## 2018-06-02 DIAGNOSIS — Z79899 Other long term (current) drug therapy: Secondary | ICD-10-CM | POA: Diagnosis not present

## 2018-06-02 DIAGNOSIS — I509 Heart failure, unspecified: Secondary | ICD-10-CM | POA: Diagnosis not present

## 2018-06-02 DIAGNOSIS — Z833 Family history of diabetes mellitus: Secondary | ICD-10-CM | POA: Insufficient documentation

## 2018-06-02 DIAGNOSIS — Z800 Family history of malignant neoplasm of digestive organs: Secondary | ICD-10-CM | POA: Diagnosis not present

## 2018-06-02 DIAGNOSIS — Z8249 Family history of ischemic heart disease and other diseases of the circulatory system: Secondary | ICD-10-CM | POA: Insufficient documentation

## 2018-06-02 DIAGNOSIS — E1165 Type 2 diabetes mellitus with hyperglycemia: Secondary | ICD-10-CM | POA: Insufficient documentation

## 2018-06-02 DIAGNOSIS — IMO0001 Reserved for inherently not codable concepts without codable children: Secondary | ICD-10-CM

## 2018-06-02 DIAGNOSIS — Z794 Long term (current) use of insulin: Secondary | ICD-10-CM | POA: Diagnosis not present

## 2018-06-02 MED ORDER — GABAPENTIN 300 MG PO CAPS: 300.0000 mg | ORAL_CAPSULE | Freq: Every day | ORAL | 1 refills | Status: DC

## 2018-06-02 NOTE — Progress Notes (Signed)
Virtual Visit via Telephone Note Due to national recommendations of social distancing due to COVID 19, telehealth visit is felt to be most appropriate for this patient at this time.  I discussed the limitations, risks, security and privacy concerns of performing an evaluation and management service by telephone and the availability of in person appointments. I also discussed with the patient that there may be a patient responsible charge related to this service. The patient expressed understanding and agreed to proceed.    I connected with Nancy [name] on 06/04/18  at  10:10 AM EDT  EDT by telephone and verified that I am speaking with the correct person using two identifiers.   Consent I discussed the limitations, risks, security and privacy concerns of performing an evaluation and management service by telephone and the availability of in person appointments. I also discussed with the patient that there may be a patient responsible charge related to this service. The patient expressed understanding and agreed to proceed.   Location of Patient: Private Residence   Location of Provider: [facility]    Persons participating in Telemedicine visit: Bertram Denver FNP-BC [place]Y [place] CMA Nancy [name]    History of Present Illness: Telemedicine visit for: F/U Meter check. Patient has been advised to apply for financial assistance and schedule to see our financial counselor.    DM TYPE 2 Reports highest post prandial reading averages 185. Still endorsing some low readings in the mornings 80s due to poor dentition causing her to eat substantially less. I have instructed her to not give herself insulin if she is not eating meals. Currently taking humalog 15 units daily and lantus 70 units at bedtime. She does have intermittent nausea which was at one point thought to be related to gastroparesis. Abdominal US 01-26-2018 negative.   She endorses hyperglycemic signs  of Diabetic neuropathy. Will start neurontin.  Lab Results  Component Value Date   HGBA1C 12.0 (H) 01/24/2018    Shortness of breath She has a history of CHF. Echo 01-2018 EF 45-50%. She endorses increased shortness of breath over the past few weeks. EKG in April also showing left atrial enlargement. She is obese with a BMI of 32. She is not diet or exercise compliant. Other symptoms include mild BLE edema which does resolve with elevation. Denies chest pain. Will need cardiology referral and BNP.   Past Medical History:  Diagnosis Date  . Acanthosis nigricans   . Anxiety   . CHF (congestive heart failure) (HCC)   . Chronic lower back pain   . Depression   . Dyspepsia   . Obesity   . Ovarian cyst    pt is not aware of this hx (11/24/2017)  . Pre-diabetes   . 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 Premature baby   . Type II diabetes mellitus (HCC)    insulin dependant    Past Surgical History:  Procedure Laterality Date  . ABDOMINAL HERNIA REPAIR     "I was a baby"  . HERNIA REPAIR    . TONSILLECTOMY AND ADENOIDECTOMY    . WISDOM TOOTH EXTRACTION  2017    Family History  Problem Relation Age of Onset  . Diabetes Mother   . Hypertension Mother   . Obesity Mother   . Asthma Mother   . Allergic rhinitis Mother   . Eczema Mother   . Diabetes Father   . Hypertension Father   . Obesity Father   . Hyperlipidemia Father   .  Hypertension Paternal Aunt   . Hypertension Maternal Grandfather   . Colon cancer Maternal Grandfather   . Diabetes Paternal Grandmother   . Obesity Paternal Grandmother   . Diabetes Paternal Grandfather   . Obesity Paternal Grandfather   . Angioedema Neg Hx   . Immunodeficiency Neg Hx   . Urticaria Neg Hx   . Stomach cancer Neg Hx   . Esophageal cancer Neg Hx     Social History   Socioeconomic History  . Marital status: Single    Spouse name: Not on file  . Number of children: 0  . Years of education: Not on file  . Highest education level: Not on  file  Occupational History  . Occupation: Admission  Social Needs  . Financial resource strain: Not on file  . Food insecurity:    Worry: Not on file    Inability: Not on file  . Transportation needs:    Medical: Not on file    Non-medical: Not on file  Tobacco Use  . Smoking status: Never Smoker  . Smokeless tobacco: Never Used  Substance and Sexual Activity  . Alcohol use: No    Alcohol/week: 0.0 standard drinks  . Drug use: No  . Sexual activity: Never  Lifestyle  . Physical activity:    Days per week: Not on file    Minutes per session: Not on file  . Stress: Not on file  Relationships  . Social connections:    Talks on phone: Not on file    Gets together: Not on file    Attends religious service: Not on file    Active member of club or organization: Not on file    Attends meetings of clubs or organizations: Not on file    Relationship status: Not on file  Other Topics Concern  . Not on file  Social History Narrative   Lives with mom and mom's girlfriend. Dad involved. 9th grade [employer].     Observations/Objective: Awake, alert and oriented x 3   Review of Systems  Constitutional: Negative for fever, malaise/fatigue and weight loss.       POOR DENTITION  HENT: Negative.  Negative for nosebleeds.   Eyes: Negative.  Negative for blurred vision, double vision and photophobia.  Respiratory: Positive for shortness of breath. Negative for cough.   Cardiovascular: Positive for leg swelling. Negative for chest pain and palpitations.  Gastrointestinal: Positive for heartburn. Negative for nausea and vomiting.  Musculoskeletal: Negative.  Negative for myalgias.  Neurological: Negative.  Negative for dizziness, focal weakness, seizures and headaches.  Psychiatric/Behavioral: Negative.  Negative for suicidal ideas.    Assessment and Plan:  Diagnoses and all orders for this visit:  Insulin dependent diabetes mellitus with complications (HCC) -      gabapentin (NEURONTIN) 300 MG capsule; Take 1 capsule (300 mg total) by mouth at bedtime for 30 days. -     CBC; Future -     Basic metabolic panel; Future -     Lipid panel; Future Diabetes is poorly controlled. Advised patient to keep a fasting blood sugar log fast, 2 hours post lunch and bedtime which will be reviewed at the next office visit.   Screening for diabetic retinopathy -     Ambulatory referral to Ophthalmology  Congestive heart failure, unspecified HF chronicity, unspecified heart failure type (HCC) -     Brain natriuretic peptide; Future DASH DIET Avoid excessive sodium intake     Follow Up Instructions Return in about  6 weeks (around 07/14/2018).     I discussed the assessment and treatment plan with the patient. The patient was provided an opportunity to ask questions and all were answered. The patient agreed with the plan and demonstrated an understanding of the instructions.   The patient was advised to call back or seek an in-person evaluation if the symptoms worsen or if the condition fails to improve as anticipated.  I provided 28 minutes of non-face-to-face time during this encounter including median intraservice time, reviewing previous notes, labs, imaging, medications and explaining diagnosis and management.  Claiborne Rigg, FNP-BC

## 2018-06-04 ENCOUNTER — Encounter: Payer: Self-pay | Admitting: Nurse Practitioner

## 2018-06-12 IMAGING — CT CT ABD-PELV W/ CM
2 of 4 series · 16 of 46 positions shown, 18 images · IV contrast (omnipaque)
Comparison: CT scan 10/13/2016

CLINICAL DATA: Abdominal pain with nausea, vomiting, and diarrhea.

EXAM:
CT ABDOMEN AND PELVIS WITH CONTRAST
TECHNIQUE: Multidetector CT imaging of the abdomen and pelvis was performed
using the standard protocol following bolus administration of
intravenous contrast.
CONTRAST:  100mL OMNIPAQUE IOHEXOL 300 MG/ML  SOLN

[Series 3: abdomen 5.0 · axial · 0.65mm/px · z∈[+753,+1133]mm · 13 of 88 slices shown, 15 images]
[im 6/88  soft-tissue]
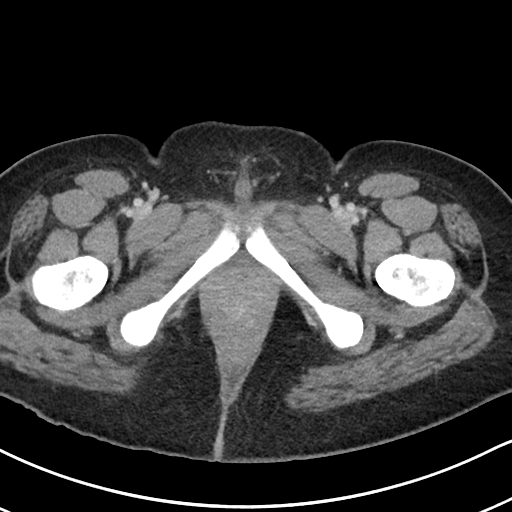
[im 6/88  bone]
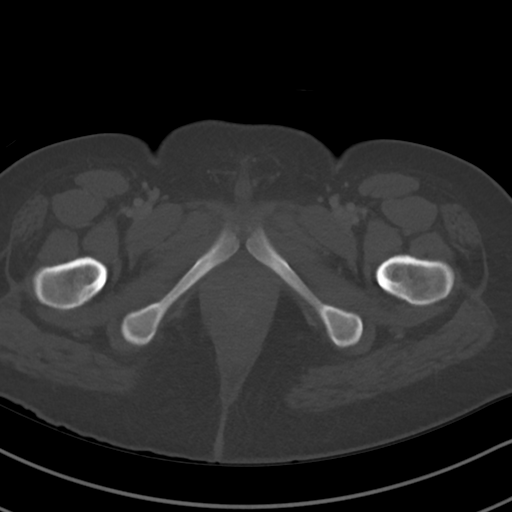
[im 11/88  soft-tissue]
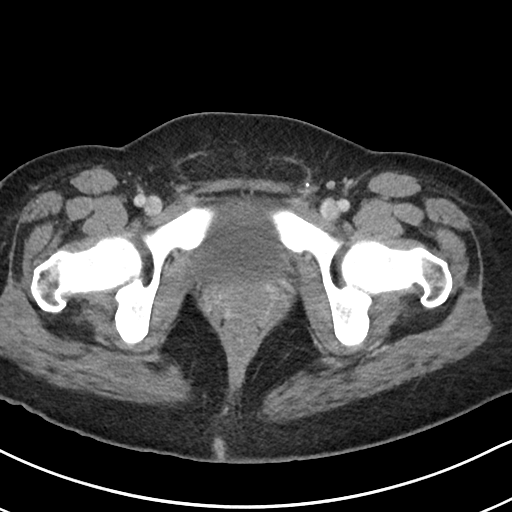
[im 21/88  soft-tissue]
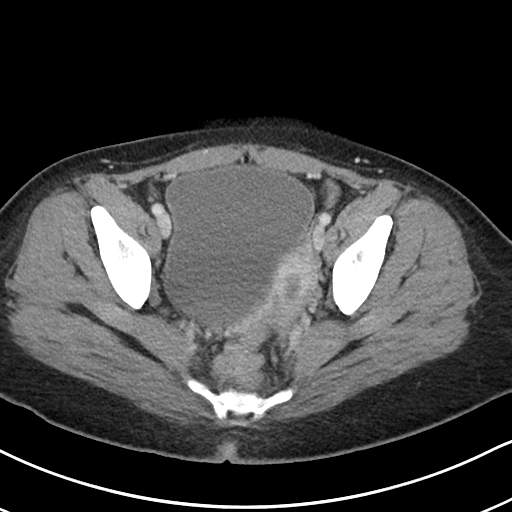
[im 26/88  soft-tissue]
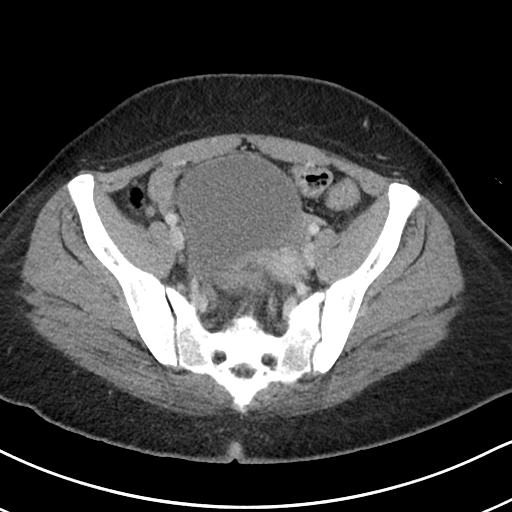
[im 31/88  soft-tissue]
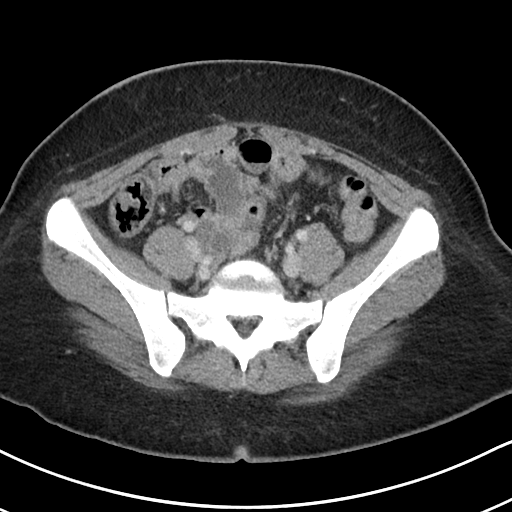
[im 36/88  soft-tissue]
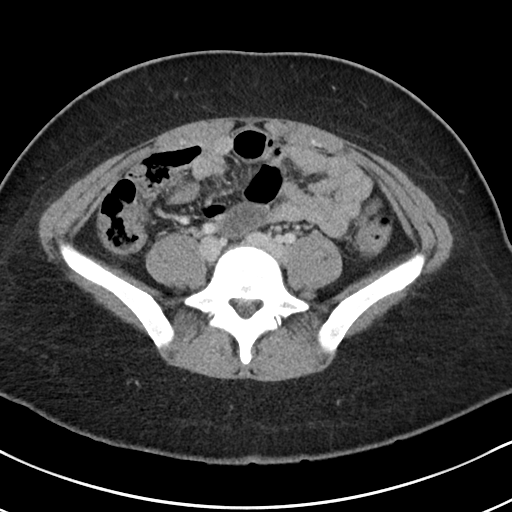
[im 47/88  soft-tissue]
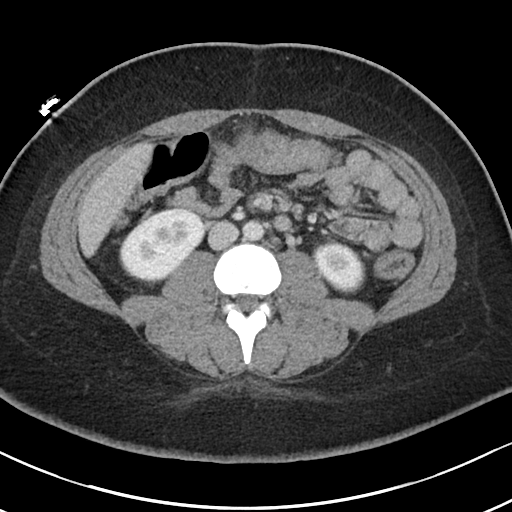
[im 52/88  soft-tissue]
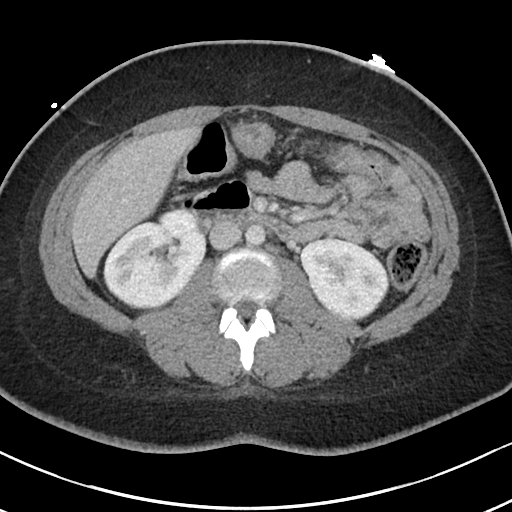
[im 57/88  soft-tissue]
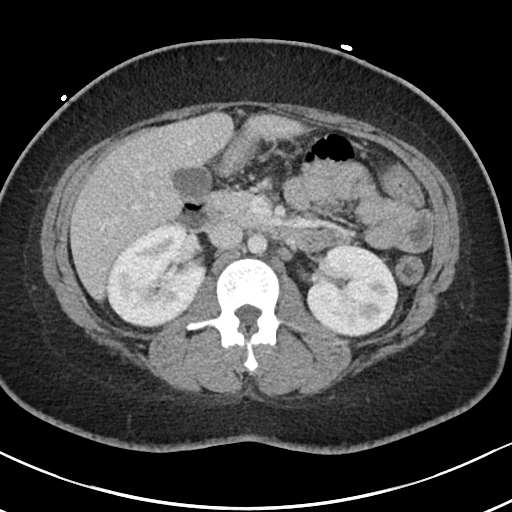
[im 57/88  bone]
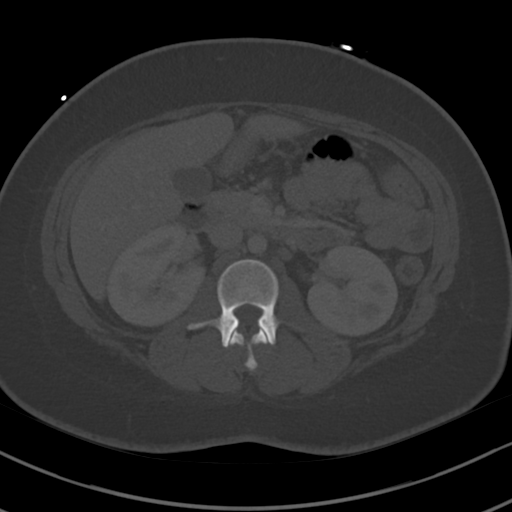
[im 62/88  soft-tissue]
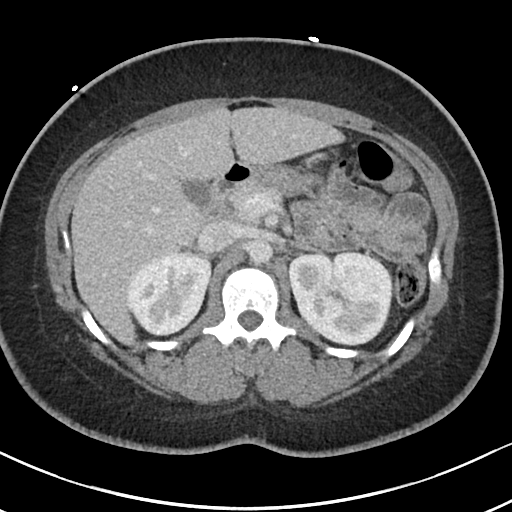
[im 67/88  soft-tissue]
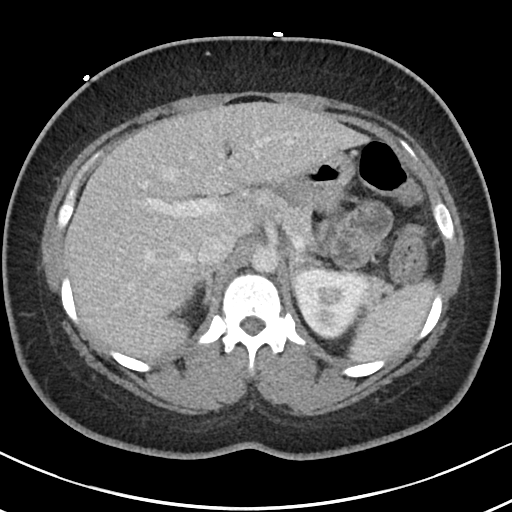
[im 77/88  soft-tissue]
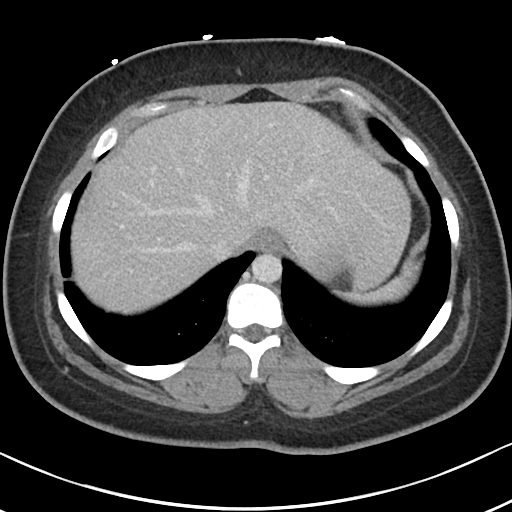
[im 82/88  soft-tissue]
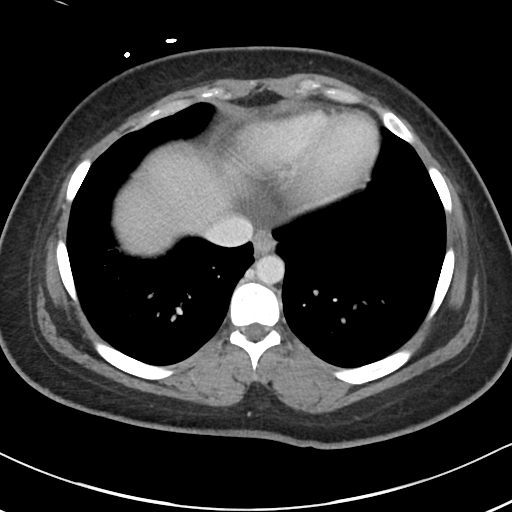

[Series 6: abdomen 3.0 mpr cor · coronal · 0.63mm/px · 3 of 101 slices shown]
[im 34/101  soft-tissue]
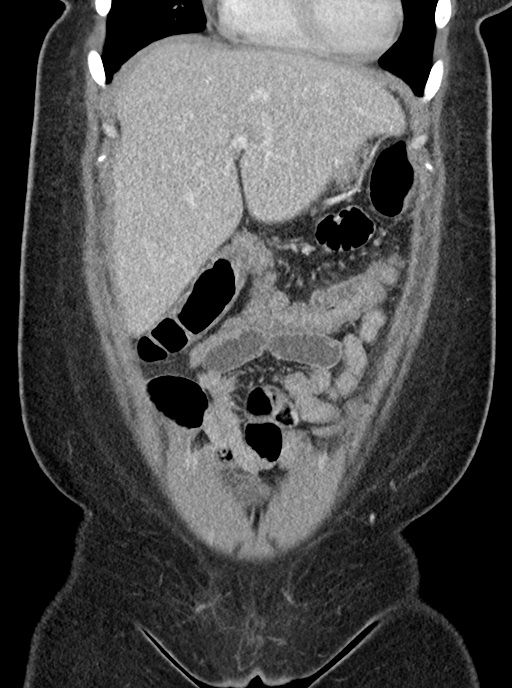
[im 45/101  soft-tissue]
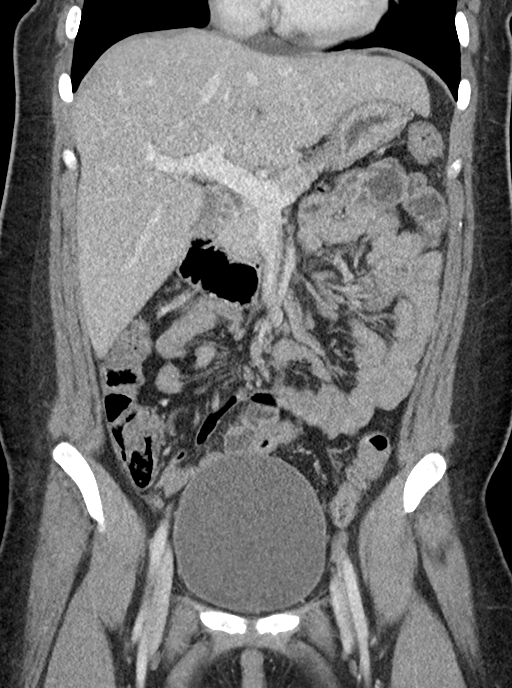
[im 56/101  soft-tissue]
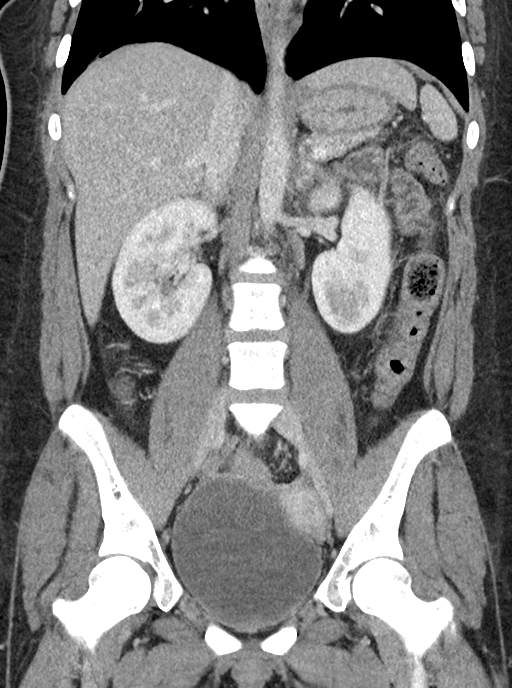

[16 of 46 positions shown; findings below may reference images not displayed]

FINDINGS: Lower chest: Normal.

Hepatobiliary: No focal liver abnormality is seen. No gallstones,
gallbladder wall thickening, or biliary dilatation.

Pancreas: Unremarkable. No pancreatic ductal dilatation or
surrounding inflammatory changes.

Spleen: Normal in size without focal abnormality.

Adrenals/Urinary Tract: Adrenal glands are unremarkable. Kidneys are
normal, without renal calculi, focal lesion, or hydronephrosis.
Bladder is unremarkable.

Stomach/Bowel: Stomach is within normal limits. Appendix appears
normal. No evidence of bowel wall thickening, distention, or
inflammatory changes.

Vascular/Lymphatic: No significant vascular findings are present. No
enlarged abdominal or pelvic lymph nodes.

Reproductive: Uterus and bilateral adnexa are unremarkable.

Other: No abdominal wall hernia or abnormality. No abdominopelvic
ascites.

Musculoskeletal: No acute or significant osseous findings.
IMPRESSION: Benign-appearing abdomen and pelvis.

## 2018-06-12 IMAGING — DX DG CHEST 2V
2 series · 2 of 2 positions shown · non-contrast
Comparison: 05/12/2017

CLINICAL DATA: Shortness of breath

EXAM:
CHEST - 2 VIEW

[chest pa]
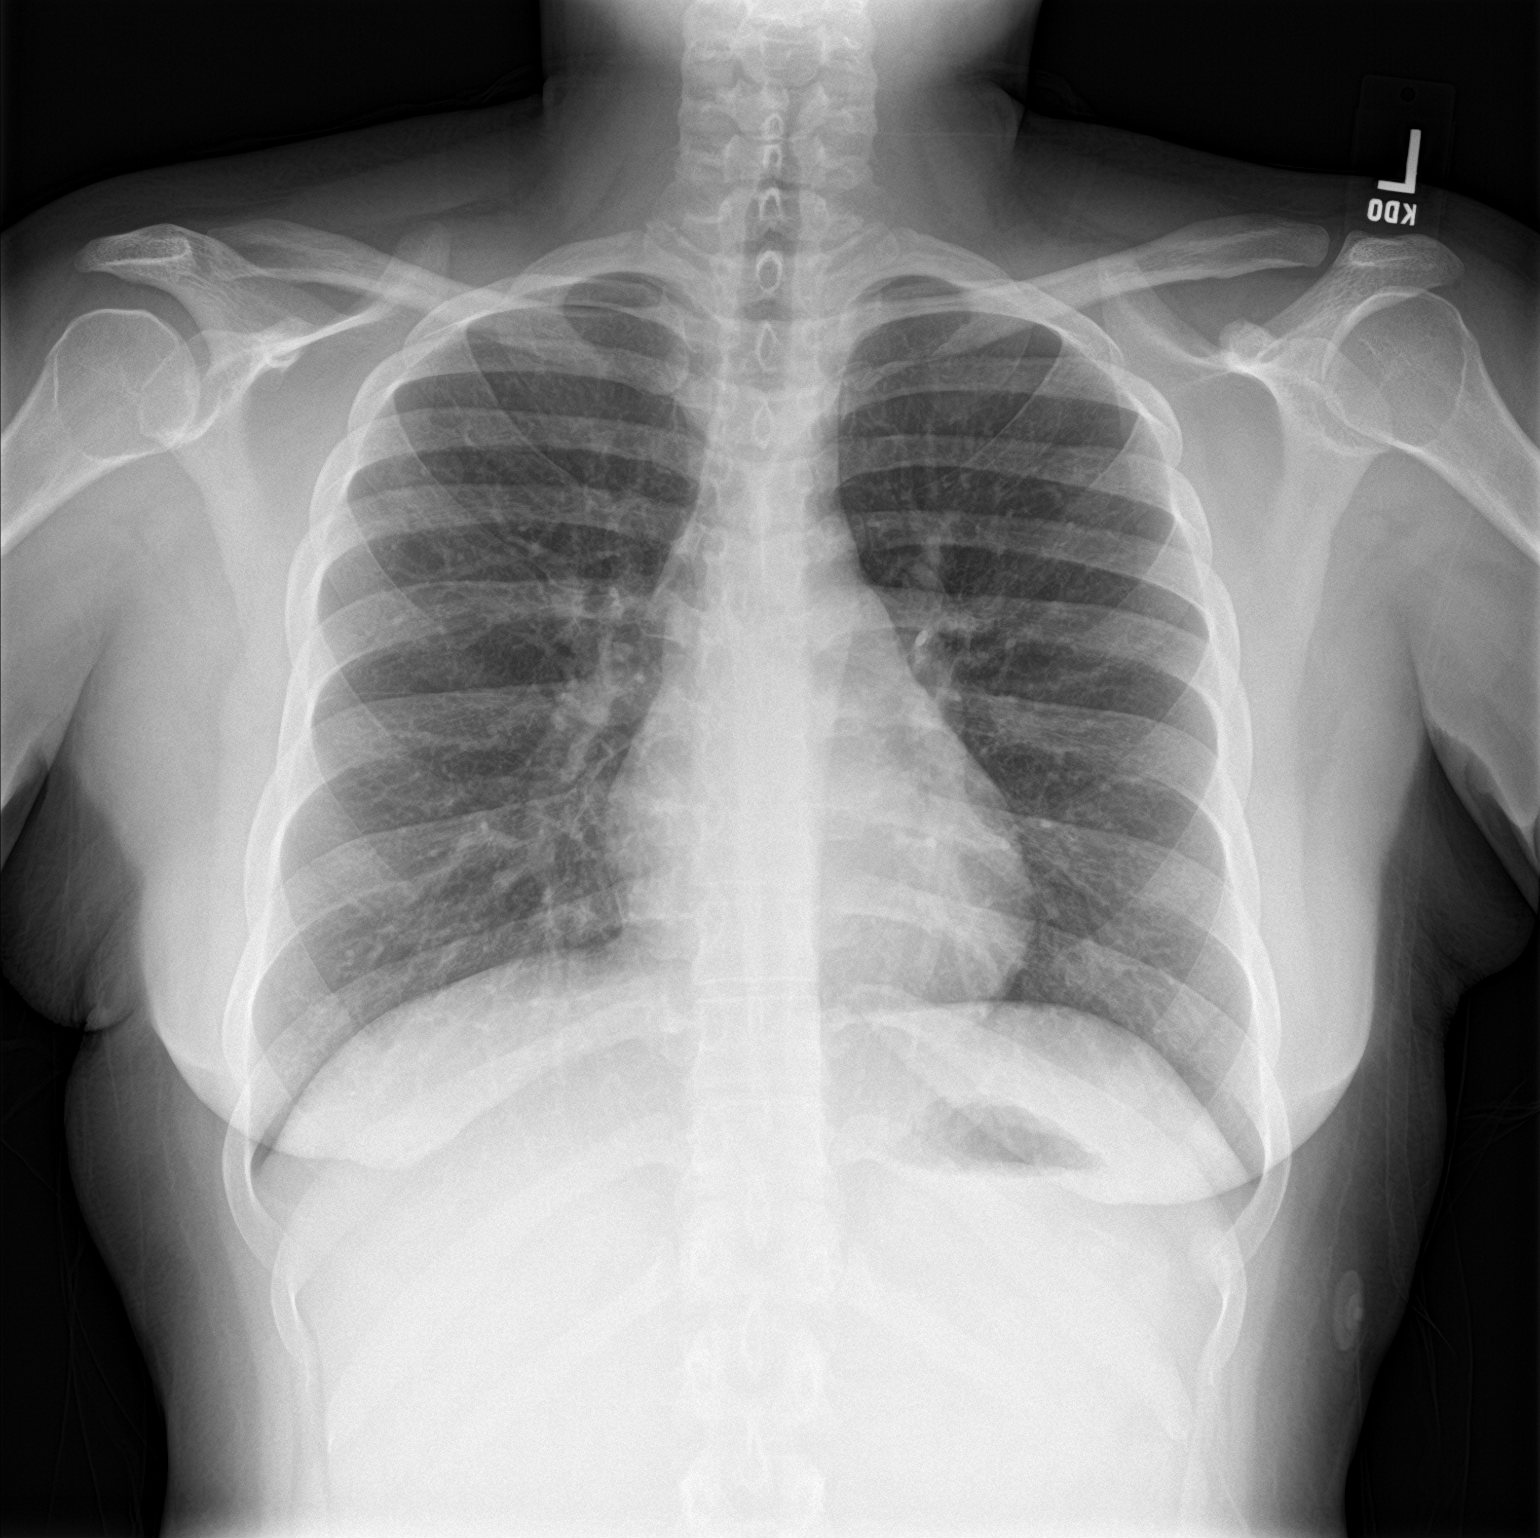

[chest lat]
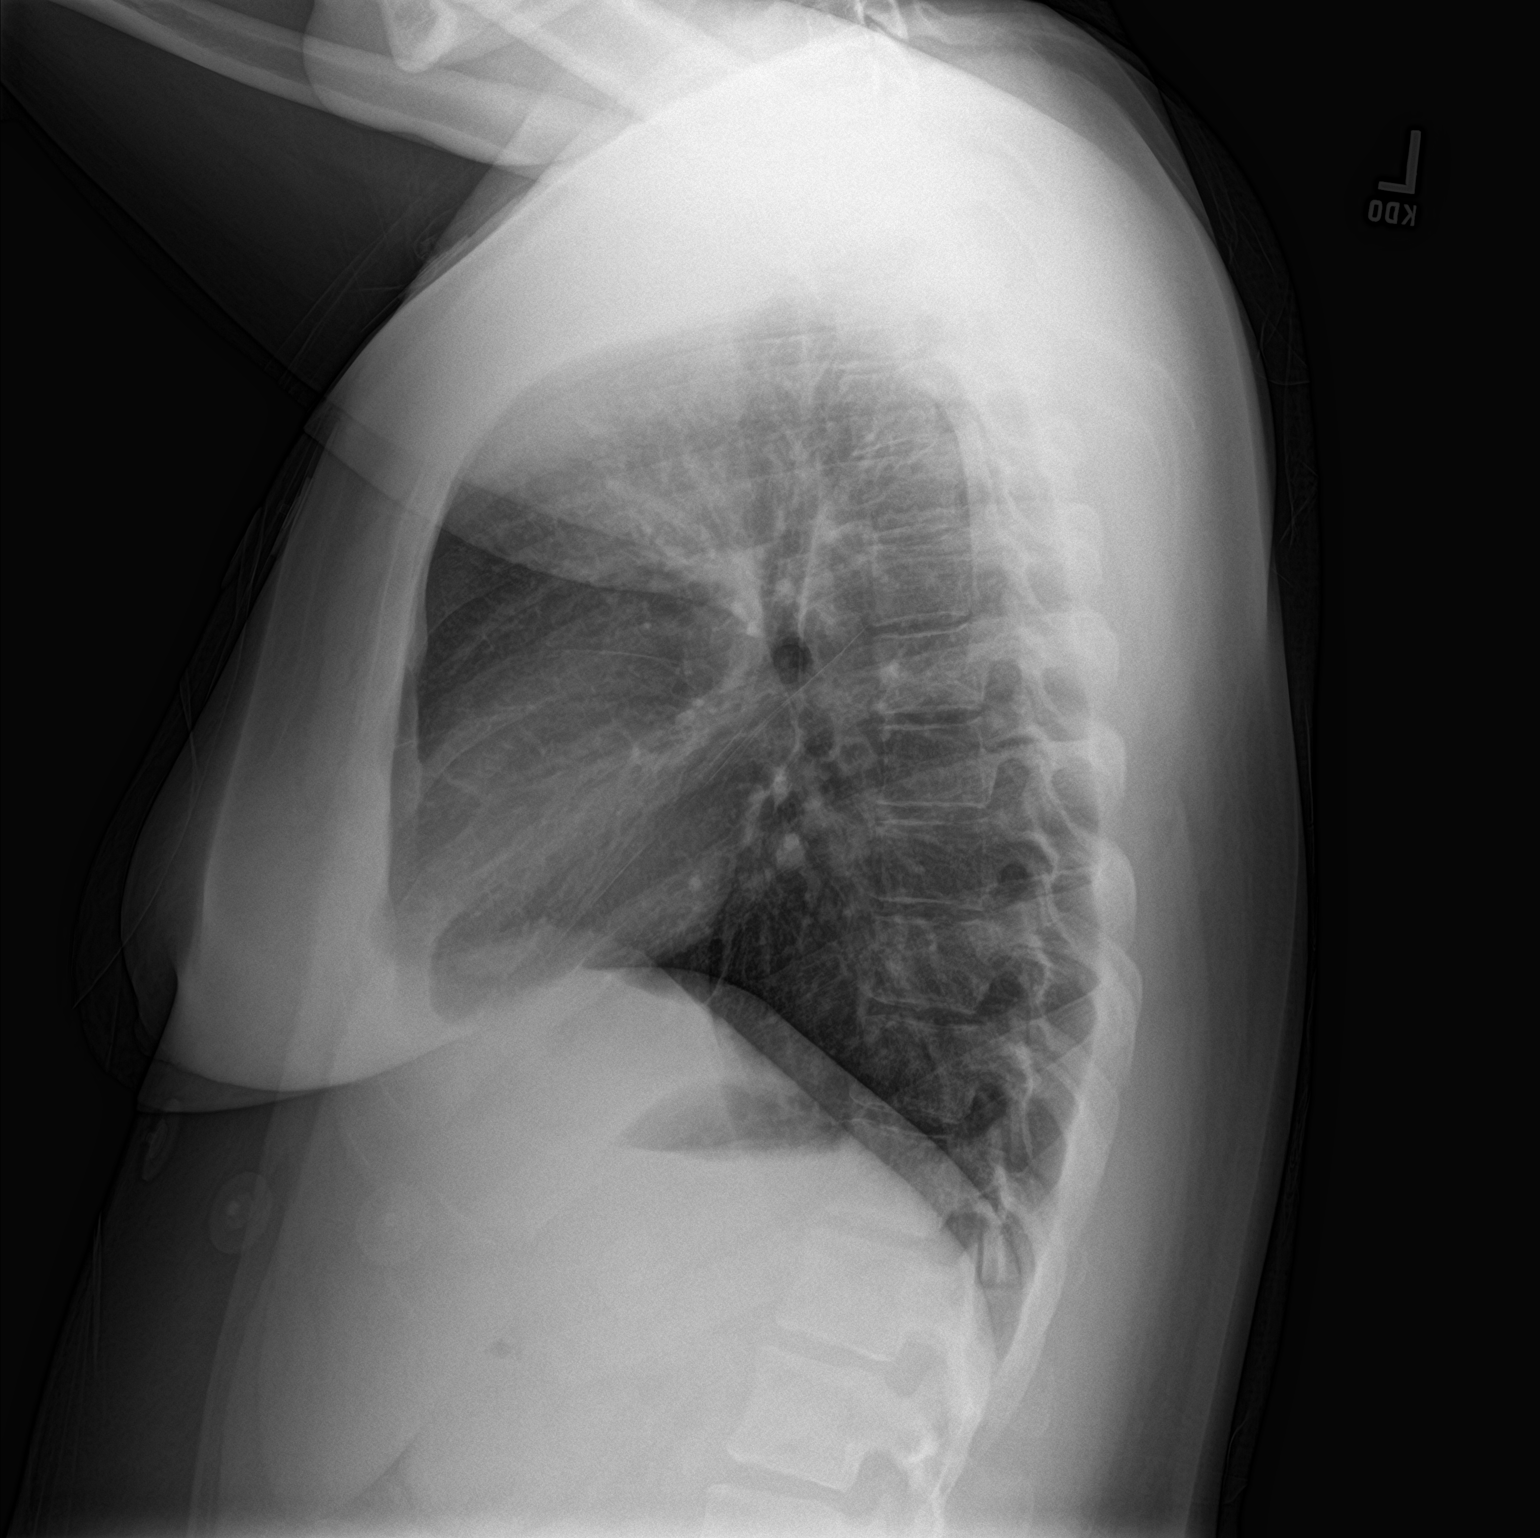

[2 of 2 positions shown; findings below may reference images not displayed]

FINDINGS: The heart size and mediastinal contours are within normal limits.
Both lungs are clear. The visualized skeletal structures are
unremarkable.
IMPRESSION: Normal chest.

## 2018-06-14 ENCOUNTER — Observation Stay (HOSPITAL_COMMUNITY)
Admission: EM | Admit: 2018-06-14 | Discharge: 2018-06-15 | Disposition: A | Discharge status: Home / Self Care | Payer: Medicaid Other | Attending: Internal Medicine | Admitting: Internal Medicine

## 2018-06-14 ENCOUNTER — Emergency Department (HOSPITAL_COMMUNITY): Payer: Medicaid Other

## 2018-06-14 ENCOUNTER — Other Ambulatory Visit: Payer: Self-pay

## 2018-06-14 ENCOUNTER — Encounter (HOSPITAL_COMMUNITY): Payer: Self-pay | Admitting: Emergency Medicine

## 2018-06-14 DIAGNOSIS — Z833 Family history of diabetes mellitus: Secondary | ICD-10-CM | POA: Diagnosis not present

## 2018-06-14 DIAGNOSIS — R569 Unspecified convulsions: Secondary | ICD-10-CM

## 2018-06-14 DIAGNOSIS — I10 Essential (primary) hypertension: Secondary | ICD-10-CM | POA: Diagnosis present

## 2018-06-14 DIAGNOSIS — M545 Low back pain: Secondary | ICD-10-CM | POA: Insufficient documentation

## 2018-06-14 DIAGNOSIS — F329 Major depressive disorder, single episode, unspecified: Secondary | ICD-10-CM | POA: Insufficient documentation

## 2018-06-14 DIAGNOSIS — Z8249 Family history of ischemic heart disease and other diseases of the circulatory system: Secondary | ICD-10-CM | POA: Diagnosis not present

## 2018-06-14 DIAGNOSIS — G894 Chronic pain syndrome: Secondary | ICD-10-CM | POA: Diagnosis not present

## 2018-06-14 DIAGNOSIS — Z886 Allergy status to analgesic agent status: Secondary | ICD-10-CM | POA: Insufficient documentation

## 2018-06-14 DIAGNOSIS — Z1159 Encounter for screening for other viral diseases: Secondary | ICD-10-CM | POA: Insufficient documentation

## 2018-06-14 DIAGNOSIS — F419 Anxiety disorder, unspecified: Secondary | ICD-10-CM | POA: Diagnosis not present

## 2018-06-14 DIAGNOSIS — F4320 Adjustment disorder, unspecified: Secondary | ICD-10-CM | POA: Insufficient documentation

## 2018-06-14 DIAGNOSIS — Z791 Long term (current) use of non-steroidal anti-inflammatories (NSAID): Secondary | ICD-10-CM | POA: Insufficient documentation

## 2018-06-14 DIAGNOSIS — R739 Hyperglycemia, unspecified: Secondary | ICD-10-CM

## 2018-06-14 DIAGNOSIS — Z91199 Patient's noncompliance with other medical treatment and regimen due to unspecified reason: Secondary | ICD-10-CM

## 2018-06-14 DIAGNOSIS — Z713 Dietary counseling and surveillance: Secondary | ICD-10-CM | POA: Diagnosis not present

## 2018-06-14 DIAGNOSIS — R079 Chest pain, unspecified: Secondary | ICD-10-CM

## 2018-06-14 DIAGNOSIS — Z79899 Other long term (current) drug therapy: Secondary | ICD-10-CM | POA: Insufficient documentation

## 2018-06-14 DIAGNOSIS — IMO0002 Reserved for concepts with insufficient information to code with codable children: Secondary | ICD-10-CM | POA: Diagnosis present

## 2018-06-14 DIAGNOSIS — I152 Hypertension secondary to endocrine disorders: Secondary | ICD-10-CM | POA: Diagnosis present

## 2018-06-14 DIAGNOSIS — I110 Hypertensive heart disease with heart failure: Secondary | ICD-10-CM | POA: Diagnosis not present

## 2018-06-14 DIAGNOSIS — R000 Tachycardia, unspecified: Secondary | ICD-10-CM | POA: Insufficient documentation

## 2018-06-14 DIAGNOSIS — G40909 Epilepsy, unspecified, not intractable, without status epilepticus: Principal | ICD-10-CM | POA: Insufficient documentation

## 2018-06-14 DIAGNOSIS — Z9114 Patient's other noncompliance with medication regimen: Secondary | ICD-10-CM | POA: Insufficient documentation

## 2018-06-14 DIAGNOSIS — E669 Obesity, unspecified: Secondary | ICD-10-CM | POA: Diagnosis present

## 2018-06-14 DIAGNOSIS — E1065 Type 1 diabetes mellitus with hyperglycemia: Secondary | ICD-10-CM | POA: Diagnosis present

## 2018-06-14 DIAGNOSIS — E1165 Type 2 diabetes mellitus with hyperglycemia: Secondary | ICD-10-CM | POA: Diagnosis not present

## 2018-06-14 DIAGNOSIS — R55 Syncope and collapse: Secondary | ICD-10-CM | POA: Insufficient documentation

## 2018-06-14 DIAGNOSIS — Z794 Long term (current) use of insulin: Secondary | ICD-10-CM | POA: Insufficient documentation

## 2018-06-14 DIAGNOSIS — I5032 Chronic diastolic (congestive) heart failure: Secondary | ICD-10-CM | POA: Insufficient documentation

## 2018-06-14 DIAGNOSIS — Z9119 Patient's noncompliance with other medical treatment and regimen: Secondary | ICD-10-CM

## 2018-06-14 LAB — URINALYSIS, ROUTINE W REFLEX MICROSCOPIC
Bacteria, UA: NONE SEEN
Bilirubin Urine: NEGATIVE
Glucose, UA: >=500 mg/dL — AB
Ketones, ur: NEGATIVE mg/dL
Leukocytes,Ua: NEGATIVE
Nitrite: NEGATIVE
Protein, ur: NEGATIVE mg/dL
Specific Gravity, Urine: 1.032 — ABNORMAL HIGH (ref 1.005–1.030)
pH: 5 (ref 5.0–8.0)

## 2018-06-14 LAB — SARS CORONAVIRUS 2 BY RT PCR (HOSPITAL ORDER, PERFORMED IN ~~LOC~~ HOSPITAL LAB): SARS Coronavirus 2: NEGATIVE

## 2018-06-14 LAB — CBC
HCT: 45.4 % (ref 36.0–46.0)
Hemoglobin: 14.6 g/dL (ref 12.0–15.0)
MCH: 27.7 pg (ref 26.0–34.0)
MCHC: 32.2 g/dL (ref 30.0–36.0)
MCV: 86 fL (ref 80.0–100.0)
Platelets: 387 10*3/uL (ref 150–400)
RBC: 5.28 MIL/uL — ABNORMAL HIGH (ref 3.87–5.11)
RDW: 14.4 % (ref 11.5–15.5)
WBC: 7.3 10*3/uL (ref 4.0–10.5)
nRBC: 0 % (ref 0.0–0.2)

## 2018-06-14 LAB — BASIC METABOLIC PANEL
Anion gap: 14 (ref 5–15)
BUN: 10 mg/dL (ref 6–20)
CO2: 23 mmol/L (ref 22–32)
Calcium: 9.4 mg/dL (ref 8.9–10.3)
Chloride: 96 mmol/L — ABNORMAL LOW (ref 98–111)
Creatinine, Ser: 0.68 mg/dL (ref 0.44–1.00)
GFR calc Af Amer: >60 mL/min (ref >60)
GFR calc non Af Amer: >60 mL/min (ref >60)
Glucose, Bld: 495 mg/dL — ABNORMAL HIGH (ref 70–99)
Potassium: 4.3 mmol/L (ref 3.5–5.1)
Sodium: 133 mmol/L — ABNORMAL LOW (ref 135–145)

## 2018-06-14 LAB — D-DIMER, QUANTITATIVE: D-Dimer, Quant: <0.27 ug/mL-FEU (ref 0.00–0.50)

## 2018-06-14 LAB — I-STAT BETA HCG BLOOD, ED (MC, WL, AP ONLY): I-stat hCG, quantitative: <5 m[IU]/mL (ref <5)

## 2018-06-14 LAB — GLUCOSE, CAPILLARY: Glucose-Capillary: 220 mg/dL — ABNORMAL HIGH (ref 70–99)

## 2018-06-14 LAB — CBG MONITORING, ED
Glucose-Capillary: 325 mg/dL — ABNORMAL HIGH (ref 70–99)
Glucose-Capillary: 266 mg/dL — ABNORMAL HIGH (ref 70–99)

## 2018-06-14 LAB — POC URINE PREG, ED: Preg Test, Ur: NEGATIVE

## 2018-06-14 MED ORDER — DICLOFENAC SODIUM 1 % TD GEL
2.0000 g | Freq: Three times a day (TID) | TRANSDERMAL | Status: DC
  Administered 2018-06-14 – 2018-06-15 (×2): 2 g via TOPICAL
  Filled 2018-06-14: qty 100

## 2018-06-14 MED ORDER — PROMETHAZINE HCL 25 MG PO TABS: 25.0000 mg | ORAL_TABLET | Freq: Four times a day (QID) | ORAL | Status: DC | PRN

## 2018-06-14 MED ORDER — FLUOXETINE HCL 20 MG PO CAPS
20.0000 mg | ORAL_CAPSULE | Freq: Every day | ORAL | Status: DC
  Administered 2018-06-14: 23:00:00 20 mg via ORAL
  Filled 2018-06-14: qty 1

## 2018-06-14 MED ORDER — ONDANSETRON HCL 4 MG/2ML IJ SOLN: 4.0000 mg | Freq: Four times a day (QID) | INTRAMUSCULAR | Status: DC | PRN

## 2018-06-14 MED ORDER — SODIUM CHLORIDE 0.9 % IV BOLUS
500.0000 mL | Freq: Once | INTRAVENOUS | Status: AC
  Administered 2018-06-14: 16:00:00 500 mL via INTRAVENOUS

## 2018-06-14 MED ORDER — GABAPENTIN 300 MG PO CAPS
300.0000 mg | ORAL_CAPSULE | Freq: Every day | ORAL | Status: DC
  Administered 2018-06-14: 300 mg via ORAL
  Filled 2018-06-14: qty 1

## 2018-06-14 MED ORDER — INSULIN ASPART 100 UNIT/ML ~~LOC~~ SOLN
0.0000 [IU] | Freq: Three times a day (TID) | SUBCUTANEOUS | Status: DC
  Administered 2018-06-15: 11 [IU] via SUBCUTANEOUS
  Administered 2018-06-15: 7 [IU] via SUBCUTANEOUS

## 2018-06-14 MED ORDER — INSULIN ASPART 100 UNIT/ML ~~LOC~~ SOLN
0.0000 [IU] | Freq: Every day | SUBCUTANEOUS | Status: DC
  Administered 2018-06-15: 2 [IU] via SUBCUTANEOUS

## 2018-06-14 MED ORDER — LEVETIRACETAM 250 MG PO TABS
250.0000 mg | ORAL_TABLET | Freq: Two times a day (BID) | ORAL | Status: DC
  Administered 2018-06-14: 23:00:00 250 mg via ORAL
  Filled 2018-06-14: qty 1

## 2018-06-14 MED ORDER — INSULIN ASPART 100 UNIT/ML ~~LOC~~ SOLN
10.0000 [IU] | Freq: Once | SUBCUTANEOUS | Status: AC
  Administered 2018-06-14: 10 [IU] via SUBCUTANEOUS

## 2018-06-14 MED ORDER — INSULIN GLARGINE 100 UNIT/ML SOLOSTAR PEN: 70.0000 [IU] | PEN_INJECTOR | Freq: Every day | SUBCUTANEOUS | Status: DC

## 2018-06-14 MED ORDER — QUETIAPINE FUMARATE 50 MG PO TABS
300.0000 mg | ORAL_TABLET | Freq: Every day | ORAL | Status: DC
  Administered 2018-06-14: 300 mg via ORAL
  Filled 2018-06-14: qty 6

## 2018-06-14 MED ORDER — PANTOPRAZOLE SODIUM 40 MG PO TBEC
40.0000 mg | DELAYED_RELEASE_TABLET | Freq: Two times a day (BID) | ORAL | Status: DC
  Administered 2018-06-14 – 2018-06-15 (×2): 40 mg via ORAL
  Filled 2018-06-14 (×2): qty 1

## 2018-06-14 MED ORDER — PROPRANOLOL HCL 20 MG PO TABS
20.0000 mg | ORAL_TABLET | Freq: Two times a day (BID) | ORAL | Status: DC
  Administered 2018-06-15 (×2): 20 mg via ORAL
  Filled 2018-06-14 (×3): qty 1

## 2018-06-14 MED ORDER — ENOXAPARIN SODIUM 40 MG/0.4ML ~~LOC~~ SOLN
40.0000 mg | SUBCUTANEOUS | Status: DC
  Administered 2018-06-14: 23:00:00 40 mg via SUBCUTANEOUS
  Filled 2018-06-14: qty 0.4

## 2018-06-14 MED ORDER — ACETAMINOPHEN 325 MG PO TABS
650.0000 mg | ORAL_TABLET | Freq: Four times a day (QID) | ORAL | Status: DC | PRN
  Administered 2018-06-14 – 2018-06-15 (×2): 650 mg via ORAL
  Filled 2018-06-14 (×2): qty 2

## 2018-06-14 MED ORDER — ONDANSETRON HCL 4 MG PO TABS: 4.0000 mg | ORAL_TABLET | Freq: Four times a day (QID) | ORAL | Status: DC | PRN

## 2018-06-14 MED ORDER — ESCITALOPRAM OXALATE 10 MG PO TABS
20.0000 mg | ORAL_TABLET | Freq: Every day | ORAL | Status: DC
  Administered 2018-06-14 – 2018-06-15 (×2): 20 mg via ORAL
  Filled 2018-06-14 (×2): qty 2

## 2018-06-14 MED ORDER — SODIUM CHLORIDE 0.9 % IV SOLN
INTRAVENOUS | Status: DC
  Administered 2018-06-15: 01:00:00 via INTRAVENOUS

## 2018-06-14 MED ORDER — SODIUM CHLORIDE 0.9 % IV SOLN: 250.0000 mg | Freq: Two times a day (BID) | INTRAVENOUS | Status: DC

## 2018-06-14 MED ORDER — INSULIN GLARGINE 100 UNIT/ML ~~LOC~~ SOLN
70.0000 [IU] | Freq: Every day | SUBCUTANEOUS | Status: DC
  Administered 2018-06-15: 70 [IU] via SUBCUTANEOUS
  Filled 2018-06-14 (×2): qty 0.7

## 2018-06-14 NOTE — ED Notes (Signed)
ED Provider at bedside. 

## 2018-06-14 NOTE — Consult Note (Addendum)
NEURO HOSPITALIST CONSULT NOTE   Requestig physician: Dr. Jonelle Sidle  Reason for Consult: Seizures  History obtained from:  Patient and Chart    HPI:                                                                                                                                          Nancy [name] is an 18 y.o. female with a PMHx of CHF, depression, obesity, insulin-dependent DM2, multiple caries and precocious adrenarche who presented to the [facility] ED this afternoon with multiple seizures and hyperglycemia. She was alert and oriented in Triage. She stated that her last seizure was yesterday on initial interview by Triage RN. She had multiple additional complaints, including intermittent CP and SOB.   Additional history regarding her seizures is obtained from the ED PA's note: "Patient also complaining of seizure-like activity, at least 8 episodes within the last 24 hours.  Last episode was around 2 AM this morning.  She states she was sitting with her mother and her mother states that fell backwards and she had some convulsive-like activity and urinary incontinence.  She states not all of the episodes included urinary incontinence, however most recent few have.  She has no prodrome.  So involved full body convulsions and lasted no more than a couple of minutes. When patient came to, she states she felt confused and disoriented, lasted about 10 to 15 minutes.  Does have history of syncope, however states she never has a prodrome with her syncope either.  She also never has convulsive-like movements with her syncopal episodes.  Syncope is not new and she has been evaluated for this."   She was noted to be hyperglycemic with a normal anion gap. Glucose at 1330 today was 495. Her CBG improved with fluids and insulin. She was febrile with tachycardia. WBC was normal. Initial neuro exam by EDP was negative. CT head was negative.   Past Medical History:  Diagnosis Date  . Acanthosis  nigricans   . Anxiety   . CHF (congestive heart failure) ([place])   . Chronic lower back pain   . Depression   . Dyspepsia   . Obesity   . Ovarian cyst    pt is not aware of this hx (11/24/2017)  . Pre-diabetes   . Precocious adrenarche ([place])   . Premature baby   . Type II diabetes mellitus (HCC)    insulin dependant    Past Surgical History:  Procedure Laterality Date  . ABDOMINAL HERNIA REPAIR     "I was a baby"  . HERNIA REPAIR    . TONSILLECTOMY AND ADENOIDECTOMY    . WISDOM TOOTH EXTRACTION  2017    Family History  Problem Relation Age of Onset  . Diabetes Mother   . Hypertension  Mother   . Obesity Mother   . Asthma Mother   . Allergic rhinitis Mother   . Eczema Mother   . Diabetes Father   . Hypertension Father   . Obesity Father   . Hyperlipidemia Father   . Hypertension Paternal Aunt   . Hypertension Maternal Grandfather   . Colon cancer Maternal Grandfather   . Diabetes Paternal Grandmother   . Obesity Paternal Grandmother   . Diabetes Paternal Grandfather   . Obesity Paternal Grandfather   . Angioedema Neg Hx   . Immunodeficiency Neg Hx   . Urticaria Neg Hx   . Stomach cancer Neg Hx   . Esophageal cancer Neg Hx              Social History:  reports that she has never smoked. She has never used smokeless tobacco. She reports that she does not drink alcohol or use drugs.  Allergies  Allergen Reactions  . Ibuprofen Other (See Comments)    GI MD said to not take this anymore    MEDICATIONS:                                                                                                                     Prior to Admission:  Medications Prior to Admission  Medication Sig Dispense Refill Last Dose  . Blood Glucose Monitoring Suppl (TRUE METRIX METER) w/Device KIT Use as instructed. Monitor blood glucose levels twice per day 1 kit 0 UNK  . diclofenac sodium (VOLTAREN) 1 % GEL Apply 2 g topically 3 (three) times daily.   06/13/2018 at Unknown time  .  escitalopram (LEXAPRO) 10 MG tablet Take 2 tablets (20 mg total) by mouth daily. 30 tablet 3 06/13/2018 at Unknown time  . FLUoxetine (PROZAC) 20 MG capsule Take 1 capsule (20 mg total) by mouth at bedtime. 30 capsule 4 06/13/2018 at Unknown time  . gabapentin (NEURONTIN) 300 MG capsule Take 1 capsule (300 mg total) by mouth at bedtime for 30 days. 30 capsule 1 06/13/2018 at Unknown time  . glucagon 1 MG injection Use for Severe Hypoglycemia . Inject 1 mg intramuscularly if unresponsive, unable to swallow, unconscious and/or has seizure 2 each 5 UNK  . glucose blood (TRUE METRIX BLOOD GLUCOSE TEST) test strip Use as instructed 100 each 12 UNK  . Insulin Glargine (LANTUS) 100 UNIT/ML Solostar Pen Inject 70 Units into the skin at bedtime. 15 mL 11 06/13/2018 at Unknown time  . insulin lispro (HUMALOG) 100 UNIT/ML injection Inject 0.15 mLs (15 Units total) into the skin 2 (two) times daily with a meal for 30 days. Hold humalog if unable to eat (Patient taking differently: Inject 15 Units into the skin See admin instructions. Hold humalog if unable to einm Inject  Units into the skin 2 times a day with meals and HOLD IF UNABLE TO EAT) 10 mL prn 06/13/2018 at Unknown time  . Insulin Pen Needle (B-D UF III MINI PEN NEEDLES) 31G X 5 MM MISC Use as instructed.  Monitor blood glucose levels twice per day 90 each 1 UNK  . pantoprazole (PROTONIX) 40 MG tablet Take 1 tablet (40 mg total) by mouth 2 (two) times daily. 60 tablet 0 06/13/2018 at Unknown time  . promethazine (PHENERGAN) 25 MG tablet Take 1 tablet (25 mg total) by mouth every 6 (six) hours as needed for nausea or vomiting. 10 tablet 0 Past Month at Unknown time  . propranolol (INDERAL) 20 MG tablet Take 1 tablet (20 mg total) by mouth 2 (two) times daily. 60 tablet 0 06/13/2018 at 1800  . QUEtiapine (SEROQUEL) 100 MG tablet Take 300 mg by mouth at bedtime.    06/13/2018 at Unknown time  . TRUEPLUS LANCETS 28G MISC Use as instructed. Monitor blood glucose levels twice  per day 100 each 3 UNK     ROS:                                                                                                                                       Has 8/10 headache. No vision changes, no confusion or trouble talking. No trouble eating. Had CP, SOB in the ED. No fever or chills. No limb weakness. Has numbness below her knee on the left, which has been ongoing for about 2 weeks. Other ROS as per HPI.    Blood pressure 117/80, pulse (!) 102, temperature 99 F (37.2 C), temperature source Oral, resp. rate (!) 21, height _0  (1.6 m), weight 85.7 kg, last menstrual period 06/07/2018, SpO2 100 %.   General Examination:                                                                                                       Physical Exam  HEENT-  [place]/AT    Lungs- Respirations unlabored Extremities- Warm and well perfused   Neurological Examination Mental Status: Intact to complex questions and commands Cranial Nerves: II: Visual fields intact with no extinction to DSS.  PERRL.  III,IV, VI: No ptosis. EOMI. V,VII: No facial droop. Temp sensation equal bilaterally  VIII: hearing intact to voice IX,X: Palate rises symmetrically XI: Symmetric  XII: midline tongue extension Motor: BUE and BLE are 5/5 with subtle decrease to LUE and LLE relative to the right.  Sensory: Decreased temp sensation LUE. Subjectively absent FT sensation to LUE and LLE Deep Tendon Reflexes:  Right brachioradialis, biceps and patella: 1+. Right achilles 0 Left brachioradialis, biceps, patella and achilles: 2+ Plantars: No movement bilaterally  Cerebellar: No ataxia with H-S  or FNF bilaterally  Gait: Deferred   Lab Results: Basic Metabolic Panel: Recent Labs  Lab 06/14/18 1330  NA 133*  K 4.3  CL 96*  CO2 23  GLUCOSE 495*  BUN 10  CREATININE 0.68  CALCIUM 9.4    CBC: Recent Labs  Lab 06/14/18 1330  WBC 7.3  HGB 14.6  HCT 45.4  MCV 86.0  PLT 387    Cardiac Enzymes: No  results for input(s): CKTOTAL, CKMB, CKMBINDEX, TROPONINI in the last 168 hours.  Lipid Panel: No results for input(s): CHOL, TRIG, HDL, CHOLHDL, VLDL, LDLCALC in the last 168 hours.  Imaging: Dg Chest 2 View  Result Date: 06/14/2018 CLINICAL DATA:  Chest pain EXAM: CHEST - 2 VIEW COMPARISON:  April 14, 2018 FINDINGS: Lungs are clear. Heart size and pulmonary vascularity are normal. No adenopathy. No pneumothorax. No bone lesions. IMPRESSION: No edema or consolidation. Electronically Signed   By: Lowella Grip III M.D.   On: 06/14/2018 16:04   Ct Head Wo Contrast  Result Date: 06/14/2018 CLINICAL DATA:  Multiple seizures, posterior head trauma EXAM: CT HEAD WITHOUT CONTRAST TECHNIQUE: Contiguous axial images were obtained from the base of the skull through the vertex without intravenous contrast. COMPARISON:  11/23/2017 FINDINGS: Brain: No evidence of acute infarction, hemorrhage, hydrocephalus, extra-axial collection or mass lesion/mass effect. Vascular: No hyperdense vessel or unexpected calcification. Skull: Normal. Negative for fracture or focal lesion. Sinuses/Orbits: No acute finding. Other: None IMPRESSION: Negative for bleed or other acute intracranial process. Electronically Signed   By: Lucrezia Europe M.D.   On: 06/14/2018 17:46    Assessment: 18 year old female with approximately 8 seizures recently 1. States that she also has a history of 2 prior seizures, the first about one year ago, with GTC activity. She states she was worked up for this here with EEG, which per her recollection was normal. Chart review reveals an EEG on 11/24/17 was normal. A subsequent EEG on 01/31/18 was also normal.  2. Regarding semiology, at least one of her 8 recent seizures began with left sided jerking per patient. This may functionally correlate with the sensory numbness, slightly increased reflexes and subtle weakness on this side noted on today's exam.  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 (Seizures may have been precipitated by severe hyperglycemia. 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 Conversely, although there is mild central atrophy on her old MRI, there is no focal cortical lesion seen). 3. Denies withdrawing from benzos or EtOH, neither of which she uses.   Recommendations: 1. EEG.  2. Start Keppra 250 mg po BID.  3. Monitoring and PRN correction of serum glucose.  4. Per [facility] statutes, patients with seizures are not allowed to drive until  they have been seizure-free for six months. Use caution when using heavy equipment or power tools. Avoid working on ladders or at heights. Take showers instead of baths. Ensure the water temperature is not too high on the home water heater. Do not go swimming alone. When caring for infants or small children, sit down when holding, feeding, or changing them to minimize risk of injury to the child in the event you have a seizure. Also, Maintain good sleep hygiene. Avoid alcohol.      Electronically signed: Dr. Kerney Elbe 06/14/2018,  7:33 PM

## 2018-06-14 NOTE — ED Provider Notes (Signed)
[facility] EMERGENCY DEPARTMENT Provider Note   CSN: [id number] Arrival date & time: 06/14/18  1217    History   Chief Complaint Chief Complaint  Patient presents with  . Seizures    HPI Nancy [name] is a 18 y.o. female past medical history of CHF, chronic abdominal pain, syncope, diabetes mellitus, hypertension, presenting to the emergency department with multiple complaints.  Patient complaining of sharp left-sided intermittent chest pain that began on Thursday with some associated intermittent shortness of breath.  Pain is worse when laying flat.  Denies new swelling or pain to her legs, no cough or fever, no history of DVT/PE.  No exogenous estrogen use, history of recent surgery, or history of cancer. Patient also complaining of seizure-like activity, at least 8 episodes within the last 24 hours.  Last episode was around 2 AM this morning.  She states she was sitting with her mother and her mother states that fell backwards and she had some convulsive-like activity and urinary incontinence.  She states not all of the episodes included urinary incontinence, however most recent few have.  She has no prodrome.  So involved full body convulsions and lasted no more than a couple of minutes. When patient came to, she states she felt confused and disoriented, lasted about 10 to 15 minutes.  Does have history of syncope, however states she never has a prodrome with her syncope either.  She also never has convulsive-like movements with her syncopal episodes.  Syncope is not new and she has been evaluated for this.     The history is provided by the patient and medical records.    Past Medical History:  Diagnosis Date  . Acanthosis nigricans   . Anxiety   . CHF (congestive heart failure) ([place])   . Chronic lower back pain   . Depression   . Dyspepsia   . Obesity   . Ovarian cyst    pt is not aware of this hx (11/24/2017)  . Pre-diabetes   . 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 Premature baby   . Type II diabetes mellitus ([facility])    insulin dependant    Patient Active Problem List   Diagnosis Date Noted  . Seizure disorder ([place]) 06/14/2018  . Syncope 01/30/2018  . Orthostatic hypotension 01/24/2018  . DKA (diabetic ketoacidosis) ([place]) 01/24/2018  . Chronic abdominal pain 12/24/2017  . Sinus tachycardia by electrocardiogram   . Acute lower UTI   . Uncontrolled type 2 diabetes mellitus with hyperglycemia ([place])   . Chest pain 12/19/2017  . Generalized abdominal pain 08/21/2017  . Non compliance with medical treatment 01/27/2012  . Adjustment disorder 09/16/2011  . DM (diabetes mellitus), type 1 ([place]) 09/15/2011  . Dyspepsia   . Acanthosis nigricans   . Goiter   . Obesity 06/14/2010  . Hypertension 06/14/2010    Past Surgical History:  Procedure Laterality Date  . ABDOMINAL HERNIA REPAIR     "I was a baby"  . HERNIA REPAIR    . TONSILLECTOMY AND ADENOIDECTOMY    . WISDOM TOOTH EXTRACTION  2017     OB History   No obstetric history on file.      Home Medications    Prior to Admission medications   Medication Sig Start Date End Date Taking? Authorizing Provider  Blood Glucose Monitoring Suppl (TRUE METRIX METER) w/Device KIT Use as instructed. Monitor blood glucose levels twice per day 12/02/17  Yes [name], NP  diclofenac sodium (VOLTAREN) 1 %  GEL Apply 2 g topically 3 (three) times daily.   Yes [provider]  escitalopram (LEXAPRO) 10 MG tablet Take 2 tablets (20 mg total) by mouth daily. 05/25/18  Yes [name], NP  FLUoxetine (PROZAC) 20 MG capsule Take 1 capsule (20 mg total) by mouth at bedtime. 05/25/18  Yes [name], NP  gabapentin (NEURONTIN) 300 MG capsule Take 1 capsule (300 mg total) by mouth at bedtime for 30 days. 06/02/18 07/02/18 Yes [name], NP  glucagon 1 MG injection Use for Severe Hypoglycemia . Inject 1 mg intramuscularly if unresponsive, unable to swallow, unconscious and/or  has seizure 07/20/13 12/19/25 Yes Renato Shin, MD  glucose blood (TRUE METRIX BLOOD GLUCOSE TEST) test strip Use as instructed 02/15/18  Yes [name], NP  Insulin Glargine (LANTUS) 100 UNIT/ML Solostar Pen Inject 70 Units into the skin at bedtime. 02/15/18  Yes [name], NP  insulin lispro (HUMALOG) 100 UNIT/ML injection Inject 0.15 mLs (15 Units total) into the skin 2 (two) times daily with a meal for 30 days. Hold humalog if unable to eat Patient taking differently: Inject 15 Units into the skin See admin instructions. Hold humalog if unable to einm Inject  Units into the skin 2 times a day with meals and HOLD IF UNABLE TO EAT 02/15/18 06/14/18 Yes [name], NP  Insulin Pen Needle (B-D UF III MINI PEN NEEDLES) 31G X 5 MM MISC Use as instructed. Monitor blood glucose levels twice per day 02/15/18  Yes [name], NP  pantoprazole (PROTONIX) 40 MG tablet Take 1 tablet (40 mg total) by mouth 2 (two) times daily. 02/01/18  Yes Geradine Girt, DO  promethazine (PHENERGAN) 25 MG tablet Take 1 tablet (25 mg total) by mouth every 6 (six) hours as needed for nausea or vomiting. 04/15/18  Yes Lawyer, Harrell Gave, PA-C  propranolol (INDERAL) 20 MG tablet Take 1 tablet (20 mg total) by mouth 2 (two) times daily. 02/01/18  Yes Vann, Jessica U, DO  QUEtiapine (SEROQUEL) 100 MG tablet Take 300 mg by mouth at bedtime.    Yes [provider]  TRUEPLUS LANCETS 28G MISC Use as instructed. Monitor blood glucose levels twice per day 12/02/17  Yes [name], NP    Family History Family History  Problem Relation Age of Onset  . Diabetes Mother   . Hypertension Mother   . Obesity Mother   . Asthma Mother   . Allergic rhinitis Mother   . Eczema Mother   . Diabetes Father   . Hypertension Father   . Obesity Father   . Hyperlipidemia Father   . Hypertension Paternal Aunt   . Hypertension Maternal Grandfather   . Colon cancer Maternal Grandfather   . Diabetes Paternal  Grandmother   . Obesity Paternal Grandmother   . Diabetes Paternal Grandfather   . Obesity Paternal Grandfather   . Angioedema Neg Hx   . Immunodeficiency Neg Hx   . Urticaria Neg Hx   . Stomach cancer Neg Hx   . Esophageal cancer Neg Hx     Social History Social History   Tobacco Use  . Smoking status: Never Smoker  . Smokeless tobacco: Never Used  Substance Use Topics  . Alcohol use: No    Alcohol/week: 0.0 standard drinks  . Drug use: No     Allergies   Ibuprofen   Review of Systems Review of Systems  Cardiovascular: Positive for chest pain.  Neurological: Positive for seizures.  All other systems reviewed and are negative.    Physical Exam Updated Vital Signs BP 115/85 (BP Location: Left Arm)   Pulse (!) 105   Temp 99 F (37.2 C) (Oral)   Resp (!) 26   Ht 5' 3"  (1.6 m)   Wt 85.7 kg   LMP 06/07/2018 (Exact Date)   SpO2 100%   BMI 33.48 kg/m   Physical Exam Vitals signs and nursing note reviewed.  Constitutional:      General: She is not in acute distress.    Appearance: She is well-developed. She is not ill-appearing.  HENT:     Head: Normocephalic and atraumatic.  Eyes:     Extraocular Movements: Extraocular movements intact.     Conjunctiva/sclera: Conjunctivae normal.     Pupils: Pupils are equal, round, and reactive to light.  Neck:     Musculoskeletal: Normal range of motion and neck supple.  Cardiovascular:     Rate and Rhythm: Regular rhythm. Tachycardia present.     Pulses: Normal pulses.  Pulmonary:     Effort: Pulmonary effort is normal. No respiratory distress.     Breath sounds: Normal breath sounds.  Chest:     Chest wall: No tenderness.  Abdominal:     General: Bowel sounds are normal.     Palpations: Abdomen is soft.     Tenderness: There is no abdominal tenderness. There is no guarding or rebound.  Musculoskeletal:     Right lower leg: No edema.     Left lower leg: No edema.  Skin:    General: Skin is warm.   Neurological:     Mental Status: She is alert.     Comments: Mental Status:  Alert, oriented, thought content appropriate, able to give a coherent history. Speech fluent without evidence of aphasia. Able to follow 2 step commands without difficulty.  Cranial Nerves:  II:  Peripheral visual fields grossly normal, pupils equal, round, reactive to light III,IV, VI: ptosis not present, extra-ocular motions intact bilaterally  V,VII: smile symmetric, facial light touch sensation equal VIII: hearing grossly normal to voice  X: uvula elevates symmetrically  XI: bilateral shoulder shrug symmetric and strong XII: midline tongue extension without fassiculations Motor:  Normal tone. 5/5 in upper and lower extremities bilaterally including strong and equal grip strength and dorsiflexion/plantar flexion Sensory: Pinprick and light touch normal in all extremities.  Cerebellar: normal finger-to-nose with bilateral upper extremities CV: distal pulses palpable throughout    Psychiatric:        Behavior: Behavior normal.      ED Treatments / Results  Labs (all labs ordered are listed, but only abnormal results are displayed) Labs Reviewed  BASIC METABOLIC PANEL - Abnormal; Notable for the following components:      Result Value   Sodium 133 (*)    Chloride 96 (*)    Glucose, Bld 495 (*)    All other components within normal limits  CBC - Abnormal; Notable for the following components:   RBC 5.28 (*)    All other components within normal limits  URINALYSIS, ROUTINE W REFLEX MICROSCOPIC - Abnormal; Notable for the following components:   APPearance HAZY (*)    Specific Gravity, Urine 1.032 (*)    Glucose, UA >=500 (*)    Hgb urine dipstick SMALL (*)    All other components within normal limits  CBG MONITORING, ED - Abnormal; Notable for the following components:   Glucose-Capillary 325 (*)    All other components within normal  limits  CBG MONITORING, ED - Abnormal; Notable for the  following components:   Glucose-Capillary 266 (*)    All other components within normal limits  SARS CORONAVIRUS 2 (HOSPITAL ORDER, [facility])  D-DIMER, QUANTITATIVE (NOT AT [facility])  I-STAT BETA HCG BLOOD, ED ([facility], [facility], [facility] ONLY)  POC URINE PREG, ED    EKG EKG Interpretation  Date/Time:  Monday June 14 2018 12:49:45 EDT Ventricular Rate:  119 PR Interval:  120 QRS Duration: 80 QT Interval:  318 QTC Calculation: 447 R Axis:   74 Text Interpretation:  Sinus tachycardia Abnormal QRS-T angle, consider primary T wave abnormality Abnormal ECG Confirmed by Julianne Rice [phone]) on 06/14/2018 3:41:48 PM   Radiology Dg Chest 2 View  Result Date: 06/14/2018 CLINICAL DATA:  Chest pain EXAM: CHEST - 2 VIEW COMPARISON:  April 14, 2018 FINDINGS: Lungs are clear. Heart size and pulmonary vascularity are normal. No adenopathy. No pneumothorax. No bone lesions. IMPRESSION: No edema or consolidation. Electronically Signed   By: Lowella Grip III M.D.   On: 06/14/2018 16:04   Ct Head Wo Contrast  Result Date: 06/14/2018 CLINICAL DATA:  Multiple seizures, posterior head trauma EXAM: CT HEAD WITHOUT CONTRAST TECHNIQUE: Contiguous axial images were obtained from the base of the skull through the vertex without intravenous contrast. COMPARISON:  11/23/2017 FINDINGS: Brain: No evidence of acute infarction, hemorrhage, hydrocephalus, extra-axial collection or mass lesion/mass effect. Vascular: No hyperdense vessel or unexpected calcification. Skull: Normal. Negative for fracture or focal lesion. Sinuses/Orbits: No acute finding. Other: None IMPRESSION: Negative for bleed or other acute intracranial process. Electronically Signed   By: Lucrezia Europe M.D.   On: 06/14/2018 17:46    Procedures Procedures (including critical care time)  Medications Ordered in ED Medications  sodium chloride 0.9 % bolus 500 mL (0 mLs Intravenous Stopped 06/14/18 1751)  insulin aspart (novoLOG) injection 10  Units (10 Units Subcutaneous Given 06/14/18 1643)     Initial Impression / Assessment and Plan / ED Course  I have reviewed the triage vital signs and the nursing notes.  Pertinent labs & imaging results that were available during my care of the patient were reviewed by me and considered in my medical decision making (see chart for details).  Clinical Course as of Jun 13 1837  Mon Jun 14, 2018  1820 Discussed with Dr. Leonel Ramsay with neurology.  At this time, will admit for seizure-like activity for further evaluation.   [JR]  [id number] Dr. Jonelle Sidle with [facility] accepting admission.   [JR]    Clinical Course User Index [JR] , [place] N, PA-C       Patient with history of CHF, diabetes (unknown if type I or type II), hypertension, presenting to the emergency department with multiple complaints.  Presenting with left-sided sharp intermittent chest pain since Thursday day, as well as about 8 more episodes of seizure-like activity when the last 24 hours and involved urinary incontinence, last episode around 2am.  Patient also noted to be hyperglycemic, however normal gap and low normal bicarb.  She is tachycardic on arrival as well, afebrile.  No distress noted.  Normal neuro exam.  No recent illness or infectious symptoms today.  D-dimer is negative, chest x-ray is negative.  Urine is negative for infection.  Negative beta-hCG.  No leukocytosis.  CT head is negative.  CBG improved after fluids and insulin.  Concern for large number of seizure-like episodes over the last 24 hours and multiple medical problems.  Consult placed to  neurology, spoke with Dr. Leonel Ramsay who recommends admission at this time for seizure-like activity versus possible syncope.  Dr. Jonelle Sidle with [facility] hospitalist accepting admission.  Patient discussed with Dr. Lita Mains.  The patient appears reasonably stabilized for admission considering the current resources, flow, and capabilities available in the ED at this time, and I  doubt any other [facility] requiring further screening and/or treatment in the ED prior to admission.   Final Clinical Impressions(s) / ED Diagnoses   Final diagnoses:  Seizure-like activity ([place])  Hyperglycemia  Intermittent chest pain    ED Discharge Orders    None       , [place] N, PA-C 06/14/18 1841    Julianne Rice, MD 06/19/18 [phone]

## 2018-06-14 NOTE — ED Notes (Signed)
D-dimer recollected and sent to main lab

## 2018-06-14 NOTE — ED Triage Notes (Signed)
Pt reports having multiple seizures as told to her by her mother. Pt CAOx4 in triage with the last seizure being yesterday.

## 2018-06-14 NOTE — ED Notes (Signed)
Nurse Navigator communication: The patient has a cell phone at bedside and is able to give updates to family.     

## 2018-06-14 NOTE — H&P (Signed)
History and Physical   Nancy [name] LHT:[id number] DOB: [birth year] DOA: 06/14/2018  Referring MD/NP/PA: Martinique Robinson, NP  PCP: [name], NP   Outpatient Specialists: None  Patient coming from: Home  Chief Complaint: Seizures  HPI: Nancy [name] is a 18 y.o. female with medical history significant of diabetes, diastolic CHF, depression, dyspepsia, morbid obesity, chronic pain syndrome, hypertension, previous syncope and chronic abdominal pain who presented with multiple complaints but notably multiple episodes of seizure-like activities.  Associated with intermittent shortness of breath as well as chest pain.  The chest pain is transient and mainly when she is laying flat.  No PND orthopnea no prior history of DVT or PE.  Patient's episodes of seizures were multiple about 8 episodes within 24 hours.  She was sitting down when she in started having them and mother noted that she fell backwards.  She was noted to have convulsive-like activity and urinary incontinence.  No prodrome.  There was postictal confusion apparently about 10 to 15 minutes.  She has previously had syncope but this time around suspected to have seizures.  Neurology was consulted in the ER and recommendation is for inpatient admission and evaluation..  ED Course: Temperature is 99 blood pressure 150/102 pulse 128 and respirate of 28.  Oxygen sat 98% room air.  Urinalysis essentially negative.  Glucose 220.  COVID-19 is negative.  Sodium 133 potassium 4.3 chloride 96 CO2 23 with glucose 495 initially.  Creatinine is 0.68.  CBC also all within normal.  Pregnancy test negative d-dimer also negative.  Chest x-ray showed no active findings and head CT without contrast showed no significant findings.  Patient is being admitted with possible seizures to be evaluated.  Review of Systems: As per HPI otherwise 10 point review of systems negative.    Past Medical History:  Diagnosis Date  . Acanthosis nigricans   .  Anxiety   . CHF (congestive heart failure) ([place])   . Chronic lower back pain   . Depression   . Dyspepsia   . Obesity   . Ovarian cyst    pt is not aware of this hx (11/24/2017)  . Pre-diabetes   . 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 Premature baby   . Type II diabetes mellitus ([facility])    insulin dependant    Past Surgical History:  Procedure Laterality Date  . ABDOMINAL HERNIA REPAIR     "I was a baby"  . HERNIA REPAIR    . TONSILLECTOMY AND ADENOIDECTOMY    . WISDOM TOOTH EXTRACTION  2017     reports that she has never smoked. She has never used smokeless tobacco. She reports that she does not drink alcohol or use drugs.  Allergies  Allergen Reactions  . Ibuprofen Other (See Comments)    GI MD said to not take this anymore    Family History  Problem Relation Age of Onset  . Diabetes Mother   . Hypertension Mother   . Obesity Mother   . Asthma Mother   . Allergic rhinitis Mother   . Eczema Mother   . Diabetes Father   . Hypertension Father   . Obesity Father   . Hyperlipidemia Father   . Hypertension Paternal Aunt   . Hypertension Maternal Grandfather   . Colon cancer Maternal Grandfather   . Diabetes Paternal Grandmother   . Obesity Paternal Grandmother   . Diabetes Paternal Grandfather   . Obesity Paternal Grandfather   . Angioedema Neg Hx   .  Immunodeficiency Neg Hx   . Urticaria Neg Hx   . Stomach cancer Neg Hx   . Esophageal cancer Neg Hx      Prior to Admission medications   Medication Sig Start Date End Date Taking? Authorizing Provider  Blood Glucose Monitoring Suppl (TRUE METRIX METER) w/Device KIT Use as instructed. Monitor blood glucose levels twice per day 12/02/17  Yes [name], NP  diclofenac sodium (VOLTAREN) 1 % GEL Apply 2 g topically 3 (three) times daily.   Yes [provider]  escitalopram (LEXAPRO) 10 MG tablet Take 2 tablets (20 mg total) by mouth daily. 05/25/18  Yes [name], NP  FLUoxetine (PROZAC) 20 MG  capsule Take 1 capsule (20 mg total) by mouth at bedtime. 05/25/18  Yes [name], NP  gabapentin (NEURONTIN) 300 MG capsule Take 1 capsule (300 mg total) by mouth at bedtime for 30 days. 06/02/18 07/02/18 Yes [name], NP  glucagon 1 MG injection Use for Severe Hypoglycemia . Inject 1 mg intramuscularly if unresponsive, unable to swallow, unconscious and/or has seizure 07/20/13 12/19/25 Yes Renato Shin, MD  glucose blood (TRUE METRIX BLOOD GLUCOSE TEST) test strip Use as instructed 02/15/18  Yes [name], NP  Insulin Glargine (LANTUS) 100 UNIT/ML Solostar Pen Inject 70 Units into the skin at bedtime. 02/15/18  Yes [name], NP  insulin lispro (HUMALOG) 100 UNIT/ML injection Inject 0.15 mLs (15 Units total) into the skin 2 (two) times daily with a meal for 30 days. Hold humalog if unable to eat Patient taking differently: Inject 15 Units into the skin See admin instructions. Hold humalog if unable to einm Inject  Units into the skin 2 times a day with meals and HOLD IF UNABLE TO EAT 02/15/18 06/14/18 Yes [name], NP  Insulin Pen Needle (B-D UF III MINI PEN NEEDLES) 31G X 5 MM MISC Use as instructed. Monitor blood glucose levels twice per day 02/15/18  Yes [name], NP  pantoprazole (PROTONIX) 40 MG tablet Take 1 tablet (40 mg total) by mouth 2 (two) times daily. 02/01/18  Yes Geradine Girt, DO  promethazine (PHENERGAN) 25 MG tablet Take 1 tablet (25 mg total) by mouth every 6 (six) hours as needed for nausea or vomiting. 04/15/18  Yes Lawyer, Harrell Gave, PA-C  propranolol (INDERAL) 20 MG tablet Take 1 tablet (20 mg total) by mouth 2 (two) times daily. 02/01/18  Yes Vann, Jessica U, DO  QUEtiapine (SEROQUEL) 100 MG tablet Take 300 mg by mouth at bedtime.    Yes [provider]  TRUEPLUS LANCETS 28G MISC Use as instructed. Monitor blood glucose levels twice per day 12/02/17  Yes [name], NP    Physical Exam: Vitals:   06/14/18 1630 06/14/18  1645 06/14/18 1700 06/14/18 1752  BP: 119/86 114/81 119/81 115/85  Pulse:  100 100 (!) 105  Resp: 14 20 (!) 22 (!) 26  Temp:      TempSrc:      SpO2:  100% 100% 100%  Weight:      Height:          Constitutional: Morbidly obese, very anxious NAD,  Vitals:   06/14/18 1630 06/14/18 1645 06/14/18 1700 06/14/18 1752  BP: 119/86 114/81 119/81 115/85  Pulse:  100 100 (!) 105  Resp: 14 20 (!) 22 (!) 26  Temp:      TempSrc:      SpO2:  100% 100% 100%  Weight:  Height:       Eyes: PERRL, lids and conjunctivae normal ENMT: Mucous membranes are moist. Posterior pharynx clear of any exudate or lesions.Normal dentition.  Neck: normal, supple, no masses, no thyromegaly Respiratory: clear to auscultation bilaterally, no wheezing, no crackles. Normal respiratory effort. No accessory muscle use.  Cardiovascular: Regular rate and rhythm, no murmurs / rubs / gallops. No extremity edema. 2+ pedal pulses. No carotid bruits.  Abdomen: no tenderness, no masses palpated. No hepatosplenomegaly. Bowel sounds positive.  Musculoskeletal: no clubbing / cyanosis. No joint deformity upper and lower extremities. Good ROM, no contractures. Normal muscle tone.  Skin: no rashes, lesions, ulcers. No induration Neurologic: CN 2-12 grossly intact. Sensation intact, DTR normal. Strength 5/5 in all 4.  Psychiatric: Normal judgment and insight. Alert and oriented x 3. Normal mood.     Labs on Admission: I have personally reviewed following labs and imaging studies  CBC: Recent Labs  Lab 06/14/18 1330  WBC 7.3  HGB 14.6  HCT 45.4  MCV 86.0  PLT 353   Basic Metabolic Panel: Recent Labs  Lab 06/14/18 1330  NA 133*  K 4.3  CL 96*  CO2 23  GLUCOSE 495*  BUN 10  CREATININE 0.68  CALCIUM 9.4   GFR: Estimated Creatinine Clearance: 117.3 mL/min (by C-G formula based on SCr of 0.68 mg/dL). Liver Function Tests: No results for input(s): AST, ALT, ALKPHOS, BILITOT, PROT, ALBUMIN in the last 168  hours. No results for input(s): LIPASE, AMYLASE in the last 168 hours. No results for input(s): AMMONIA in the last 168 hours. Coagulation Profile: No results for input(s): INR, PROTIME in the last 168 hours. Cardiac Enzymes: No results for input(s): CKTOTAL, CKMB, CKMBINDEX, TROPONINI in the last 168 hours. BNP (last 3 results) No results for input(s): PROBNP in the last 8760 hours. HbA1C: No results for input(s): HGBA1C in the last 72 hours. CBG: Recent Labs  Lab 06/14/18 1624 06/14/18 1743  GLUCAP 325* 266*   Lipid Profile: No results for input(s): CHOL, HDL, LDLCALC, TRIG, CHOLHDL, LDLDIRECT in the last 72 hours. Thyroid Function Tests: No results for input(s): TSH, T4TOTAL, FREET4, T3FREE, THYROIDAB in the last 72 hours. Anemia Panel: No results for input(s): VITAMINB12, FOLATE, FERRITIN, TIBC, IRON, RETICCTPCT in the last 72 hours. Urine analysis:    Component Value Date/Time   COLORURINE YELLOW 06/14/2018 1630   APPEARANCEUR HAZY (A) 06/14/2018 1630   LABSPEC 1.032 (H) 06/14/2018 1630   PHURINE 5.0 06/14/2018 1630   GLUCOSEU >=500 (A) 06/14/2018 1630   HGBUR SMALL (A) 06/14/2018 1630   BILIRUBINUR NEGATIVE 06/14/2018 1630   BILIRUBINUR negative 02/15/2018 1617   KETONESUR NEGATIVE 06/14/2018 1630   PROTEINUR NEGATIVE 06/14/2018 1630   UROBILINOGEN 0.2 02/15/2018 1617   UROBILINOGEN 1.0 07/09/2017 1324   NITRITE NEGATIVE 06/14/2018 1630   LEUKOCYTESUR NEGATIVE 06/14/2018 1630   Sepsis Labs: _0 (procalcitonin:4,lacticidven:4) )No results found for this or any previous visit (from the past 240 hour(s)).   Radiological Exams on Admission: Dg Chest 2 View  Result Date: 06/14/2018 CLINICAL DATA:  Chest pain EXAM: CHEST - 2 VIEW COMPARISON:  April 14, 2018 FINDINGS: Lungs are clear. Heart size and pulmonary vascularity are normal. No adenopathy. No pneumothorax. No bone lesions. IMPRESSION: No edema or consolidation. Electronically Signed   By: Lowella Grip  III M.D.   On: 06/14/2018 16:04   Ct Head Wo Contrast  Result Date: 06/14/2018 CLINICAL DATA:  Multiple seizures, posterior head trauma EXAM: CT HEAD WITHOUT CONTRAST TECHNIQUE: Contiguous axial images were  obtained from the base of the skull through the vertex without intravenous contrast. COMPARISON:  11/23/2017 FINDINGS: Brain: No evidence of acute infarction, hemorrhage, hydrocephalus, extra-axial collection or mass lesion/mass effect. Vascular: No hyperdense vessel or unexpected calcification. Skull: Normal. Negative for fracture or focal lesion. Sinuses/Orbits: No acute finding. Other: None IMPRESSION: Negative for bleed or other acute intracranial process. Electronically Signed   By: Lucrezia Europe M.D.   On: 06/14/2018 17:46    EKG: Independently reviewed. It shows sinus tachycardia with a rate of 115, evidence of right atrial enlargement, early repolarization.  No Change from old EKG on April 2020  Assessment/Plan Principal Problem:   Seizure disorder [facility]) Active Problems:   Obesity   Hypertension   DM (diabetes mellitus), type 1 ([place])   Adjustment disorder   Non compliance with medical treatment   Syncope     #1 suspected seizure disorder: Patient has not had any observed seizure in the hospital.  Syncope versus seizure being worked up.  No medications loaded at this point.  Neurology suggested admissions.  Will defer decision on antiseizure medicine to neurology.  #2 diabetes: Type II.  Uncontrolled.  Start sliding scale insulin with home regimen.  #3 morbid obesity: Dietary counseling.  #4 hypertension: Resume home regimen and continue.  #5 adjustment disorder: Again we will continue with home regimen.  She appears to be very stable.  #6 medication noncompliance: Reportedly noncompliant with medicines.  Patient will receive counseling.     DVT prophylaxis: Lovenox Code Status: Full code Family Communication: Care is discussed with patient. Disposition Plan: Home  Consults called: Dr. Katherine Roan of neurology Admission status: Observation  Severity of Illness: The appropriate patient status for this patient is OBSERVATION. Observation status is judged to be reasonable and necessary in order to provide the required intensity of service to ensure the patient's safety. The patient's presenting symptoms, physical exam findings, and initial radiographic and laboratory data in the context of their medical condition is felt to place them at decreased risk for further clinical deterioration. Furthermore, it is anticipated that the patient will be medically stable for discharge from the hospital within 2 midnights of admission. The following factors support the patient status of observation.   " The patient's presenting symptoms include seizure-like activities. " The physical exam findings include no abnormal finding at this point. " The initial radiographic and laboratory data are mostly within normal.     Neldon Shepard,LAWAL MD [facility] Pager [phone]  If 7PM-7AM, please contact night-coverage [email or web address] Password [facility]  06/14/2018, 6:47 PM

## 2018-06-14 NOTE — ED Notes (Signed)
Report given to 3W RN. All questions answered.  

## 2018-06-15 ENCOUNTER — Inpatient Hospital Stay (HOSPITAL_COMMUNITY): Payer: Medicaid Other

## 2018-06-15 DIAGNOSIS — G40909 Epilepsy, unspecified, not intractable, without status epilepticus: Secondary | ICD-10-CM | POA: Diagnosis not present

## 2018-06-15 DIAGNOSIS — R739 Hyperglycemia, unspecified: Secondary | ICD-10-CM | POA: Diagnosis not present

## 2018-06-15 LAB — COMPREHENSIVE METABOLIC PANEL
ALT: 12 U/L (ref 0–44)
AST: 13 U/L — ABNORMAL LOW (ref 15–41)
Albumin: 3.2 g/dL — ABNORMAL LOW (ref 3.5–5.0)
Alkaline Phosphatase: 57 U/L (ref 38–126)
Anion gap: 9 (ref 5–15)
BUN: 14 mg/dL (ref 6–20)
CO2: 24 mmol/L (ref 22–32)
Calcium: 8.7 mg/dL — ABNORMAL LOW (ref 8.9–10.3)
Chloride: 103 mmol/L (ref 98–111)
Creatinine, Ser: 0.47 mg/dL (ref 0.44–1.00)
GFR calc Af Amer: >60 mL/min (ref >60)
GFR calc non Af Amer: >60 mL/min (ref >60)
Glucose, Bld: 288 mg/dL — ABNORMAL HIGH (ref 70–99)
Potassium: 3.7 mmol/L (ref 3.5–5.1)
Sodium: 136 mmol/L (ref 135–145)
Total Bilirubin: 0.4 mg/dL (ref 0.3–1.2)
Total Protein: 5.6 g/dL — ABNORMAL LOW (ref 6.5–8.1)

## 2018-06-15 LAB — CBC
HCT: 40.9 % (ref 36.0–46.0)
Hemoglobin: 13.5 g/dL (ref 12.0–15.0)
MCH: 28.2 pg (ref 26.0–34.0)
MCHC: 33 g/dL (ref 30.0–36.0)
MCV: 85.4 fL (ref 80.0–100.0)
Platelets: 333 10*3/uL (ref 150–400)
RBC: 4.79 MIL/uL (ref 3.87–5.11)
RDW: 14.2 % (ref 11.5–15.5)
WBC: 8 10*3/uL (ref 4.0–10.5)
nRBC: 0 % (ref 0.0–0.2)

## 2018-06-15 LAB — GLUCOSE, CAPILLARY
Glucose-Capillary: 237 mg/dL — ABNORMAL HIGH (ref 70–99)
Glucose-Capillary: 299 mg/dL — ABNORMAL HIGH (ref 70–99)

## 2018-06-15 MED ORDER — LEVETIRACETAM 500 MG PO TABS
500.0000 mg | ORAL_TABLET | Freq: Two times a day (BID) | ORAL | Status: DC
  Administered 2018-06-15: 10:00:00 500 mg via ORAL
  Filled 2018-06-15: qty 1

## 2018-06-15 MED ORDER — LEVETIRACETAM 500 MG PO TABS: 500.0000 mg | ORAL_TABLET | Freq: Two times a day (BID) | ORAL | 0 refills | Status: DC

## 2018-06-15 MED FILL — levETIRAcetam 500 MG TABS: 500 | 30 days supply | Qty: 60 | Fill #0

## 2018-06-15 NOTE — Progress Notes (Signed)
Inpatient Diabetes Program Recommendations  AACE/ADA: New Consensus Statement on Inpatient Glycemic Control (2015)  Target Ranges:  Prepandial:   less than 140 mg/dL      Peak postprandial:   less than 180 mg/dL (1-2 hours)      Critically ill patients:  140 - 180 mg/dL   Lab Results  Component Value Date   GLUCAP 299 (H) 06/15/2018   HGBA1C 12.0 (H) 01/24/2018   Spoke with the patient regarding outpatient diabetes management. Patient is seen at the [facility]. Denies missing doses or having trouble picking up medications.  Reviewed patient's last A1c of 12.0%. Explained what a A1c is and what it measures. Also reviewed goal A1c with patient, importance of good glucose control @ home, and blood sugar goals. Reviewed patho of DM, need for insulin, role of pancreas, vascular changes and comorbidites.  Patient has a meter and reports checking 2-3 times per day. Reports fastings are below 120 mg/dL and post prandials are usually <200 mg/dL. Discussed current inpatient trends and patient states that this has not been her normal. Stressed the importance of continuing to taking CBGs at the same times 2-3 times per day and following up with [facility]. Patient aware of appointment and has no further questions.   Thanks, Bronson Curb, MSN, RNC-OB Diabetes Coordinator [phone] (8a-5p)

## 2018-06-15 NOTE — Progress Notes (Signed)
Pt given discharge summary and discharged home via mother as transportation.

## 2018-06-15 NOTE — Procedures (Signed)
History: 18 year old female being evaluated for seizures  Sedation: None  Technique: This is a 21 channel routine scalp EEG performed at the bedside with bipolar and monopolar montages arranged in accordance to the international 10/20 system of electrode placement. One channel was dedicated to EKG recording.    Background: The background consists of intermixed alpha and beta activities. There is a well defined posterior dominant rhythm of 10 hz that attenuates with eye opening. Sleep is recorded with normal appearing structures.   Photic stimulation: Physiologic driving is present  EEG Abnormalities: None  Clinical Interpretation: This normal EEG is recorded in the waking and sleep state. There was no seizure or seizure predisposition recorded on this study. Please note that lack of epileptiform activity on EEG does not preclude the possibility of epilepsy.   Roland Rack, MD [facility] [phone]  If 7pm- 7am, please page neurology on call as listed in [place].

## 2018-06-15 NOTE — Progress Notes (Signed)
EEG Completed; Results Pending  

## 2018-06-15 NOTE — Progress Notes (Signed)
Subjective: No further seizures  Exam: Vitals:   06/15/18 0900 06/15/18 1200  BP: 101/69 96/72  Pulse: 91 84  Resp: 17 18  Temp: 98.4 F (36.9 C) 98.9 F (37.2 C)  SpO2: 100% 100%   Gen: In bed, NAD Resp: non-labored breathing, no acute distress Abd: soft, nt  Neuro: MS: Awake, alert, interactive and appropriate CN: Visual fields full Motor: No pronator drift  Pertinent Labs: Glucose 288  Impression: 18 year old female with recurrent episodes concerning for seizure yesterday.  Given her history of syncope of unexplained origin, I do think that antiepileptic therapy is reasonable.  MRI done previously was negative, I do not think this needs to repeated at this time.  Recommendations: 1) Keppra 500 mg twice daily 2) I informed her that she is allowed to drive for 6 months.  Roland Rack, MD [facility] [phone]  If 7pm- 7am, please page neurology on call as listed in [place].

## 2018-06-15 NOTE — Progress Notes (Addendum)
Inpatient Diabetes Program Recommendations  AACE/ADA: New Consensus Statement on Inpatient Glycemic Control (2015)  Target Ranges:  Prepandial:   less than 140 mg/dL      Peak postprandial:   less than 180 mg/dL (1-2 hours)      Critically ill patients:  140 - 180 mg/dL   Lab Results  Component Value Date   GLUCAP 237 (H) 06/15/2018   HGBA1C 12.0 (H) 01/24/2018    Review of Glycemic Control Results for Nancy [name], Nancy [name] (MRN [id number]) as of 06/15/2018 09:14  Ref. Range 06/14/2018 16:24 06/14/2018 17:43 06/14/2018 22:34 06/15/2018 07:09  Glucose-Capillary Latest Ref Range: 70 - 99 mg/dL 325 (H) 266 (H) 220 (H) 237 (H)   Diabetes history: Type 2 DM Outpatient Diabetes medications: Lantus 70 units QHS, Humalog 15 units BID Current orders for Inpatient glycemic control: Lantus 70 units QHS, Novolog 0-20 units TID, Novolog 0-5 units QHS  Inpatient Diabetes Program Recommendations:    Last A1C was in January 2020. Consider repeating A1C?  Also, consider increasing Lantus to 76 units QHS and adding meal coverage: Novolog 4 units TID (asusming patient is consuming >50% of meals).   Thanks, Bronson Curb, MSN, RNC-OB Diabetes Coordinator [phone] (8a-5p)

## 2018-06-15 NOTE — TOC Transition Note (Signed)
Transition of Care [facility]) - CM/SW Discharge Note   Patient Details  Name: VALERY AMEDEE MRN: [id number] Date of Birth: [birth year]  Transition of Care [facility]) CM/SW Contact:  Zenon Mayo, RN Phone Number: 06/15/2018, 1:44 PM   Clinical Narrative:    From home with mom, pta indep, she has follow up apt at [facility] clinic, she will go there to get her medications with her card. She has transportation at [employer].    Final next level of care: Home/Self Care Barriers to Discharge: No Barriers Identified   Patient Goals and CMS Choice Patient states their goals for this hospitalization and ongoing recovery are:: to get better   Choice offered to / list presented to : NA  Discharge Placement                       Discharge Plan and Services In-house Referral: NA Discharge Planning Services: CM Consult, [facility], Follow-up appt scheduled, Medication Assistance Post Acute Care Choice: NA          DME Arranged: N/A DME Agency: NA       HH Arranged: NA HH Agency: NA        Social Determinants of Health (SDOH) Interventions     Readmission Risk Interventions Readmission Risk Prevention Plan 06/15/2018  Transportation Screening Complete  Medication Review Press [employer]) Complete  PCP or Specialist appointment within 3-5 days of discharge Complete  HRI or [place] Complete  SW Recovery Care/Counseling Consult Complete  [place] Not Applicable  Some recent data might be hidden

## 2018-06-15 NOTE — Discharge Summary (Signed)
Physician Discharge Summary  Nancy [name] OTL:[id number] DOB: [birth year] DOA: 06/14/2018  PCP: [name], NP  Admit date: 06/14/2018 Discharge date: 06/15/2018  Admitted From: Home  Disposition: Home   Recommendations for Outpatient Follow-up:  1. Follow up with PCP/neurology in 1-2 weeks scheduled 2. Please obtain BMP/CBC in one week  Discharge Condition: Stable CODE STATUS: Full  Diet recommendation: As tolerated  Brief/Interim Summary: Nancy [name] is a 18 y.o. female with medical history significant of diabetes, diastolic CHF, depression, dyspepsia, morbid obesity, chronic pain syndrome, hypertension, previous syncope and chronic abdominal pain who presented with multiple complaints but notably multiple episodes of seizure-like activities.  Associated with intermittent shortness of breath as well as chest pain.  The chest pain is transient and mainly when she is laying flat.  No PND orthopnea no prior history of DVT or PE.  Patient's episodes of seizures were multiple about 8 episodes within 24 hours.  She was sitting down when she in started having them and mother noted that she fell backwards.  She was noted to have convulsive-like activity and urinary incontinence.  No prodrome.  There was postictal confusion apparently about 10 to 15 minutes.  She has previously had syncope but this time around suspected to have seizures.  Neurology was consulted in the ER and recommendation is for inpatient admission and evaluation. In ED temperature is 99 blood pressure 150/102 pulse 128 and respirate of 28.  Oxygen sat 98% room air.  Urinalysis essentially negative.  Glucose 220.  COVID-19 is negative.  Sodium 133 potassium 4.3 chloride 96 CO2 23 with glucose 495 initially.  Creatinine is 0.68.  CBC also all within normal.  Pregnancy test negative d-dimer also negative.  Chest x-ray showed no active findings and head CT without contrast showed no significant findings.  Patient is being admitted  with possible seizures to be evaluated.  Patient admitted as above with multiple episodes of syncope with questionable seizure-like activity.  Further review and discussion with neurology patient has multiple episodes in the past of unprovoked syncope without clear etiology, given patient's multiple shaking episodes as above with incontinence and essentially a postictal state patient is been initiated on Keppra 500 twice daily, EEG pending, patient will need close follow-up with PCP and neurology in the outpatient setting ongoing evaluation and treatment.  Otherwise patient has been counseled at length at bedside about the need for driving cessation given her high risk for collapse with increased risk of morbidity mortality and risk to others as well as herself.  So advised avoidance of heights, machinery operation, power tool operation, and she would think would put her at a higher risk should she acutely syncopized or have a seizure.  Otherwise stable and agreeable for discharge as above.  Discharge Diagnoses:  Principal Problem:   Seizure disorder ([place]) Active Problems:   Obesity   Hypertension   DM (diabetes mellitus), type 1 (HCC)   Adjustment disorder   Non compliance with medical treatment   Syncope  Acute Bolick encephalopathy likely undiagnosed seizure disorder:  Neurology  following appreciate insight and recommendations deferring to their expertise  No recurrent episodes of seizure in the inpatient setting, Keppra initiated at 500 twice daily EEG unremarkable per discussion with neurology for acute seizure-like activity -this does not preclude or rule out seizures Close follow-up with PCP and neurology as above in the outpatient setting, continue Keppra 500 twice daily  Insulin dependent diabetes type 2, poorly controlled  Continue home regimen, No medication changes  during hospital stay Discussion about need for medication and dietary compliance Patient will need outpatient  follow-up, if glucose remains uncontrolled despite dietary control and medication compliance noted then adjust patient's home insulin dosing  Morbid obesity:  Dietary counseling at length at bedside  Hypertension, essential:  Continue home medications including propranolol Well controlled today -transiently elevated at intake, questionably stress related postictal state  Adjustment disorder:  Home medications, appears to be quite stable   Marked medication noncompliance:  Lengthy discussion at bedside about need for medication compliance in the setting of newly diagnosed seizure, poorly controlled diabetes, hypertension adjustment disorder, and obesity.  Discharge Instructions Follow up with PCP and neurology as scheduled  Allergies as of 06/15/2018      Reactions   Ibuprofen Other (See Comments)   GI MD said to not take this anymore      Medication List    TAKE these medications   diclofenac sodium 1 % Gel Commonly known as:  VOLTAREN Apply 2 g topically 3 (three) times daily.   escitalopram 10 MG tablet Commonly known as:  LEXAPRO Take 2 tablets (20 mg total) by mouth daily.   FLUoxetine 20 MG capsule Commonly known as:  PROZAC Take 1 capsule (20 mg total) by mouth at bedtime.   gabapentin 300 MG capsule Commonly known as:  NEURONTIN Take 1 capsule (300 mg total) by mouth at bedtime for 30 days.   glucagon 1 MG injection Use for Severe Hypoglycemia . Inject 1 mg intramuscularly if unresponsive, unable to swallow, unconscious and/or has seizure   glucose blood test strip Commonly known as:  True Metrix Blood Glucose Test Use as instructed   Insulin Glargine 100 UNIT/ML Solostar Pen Commonly known as:  LANTUS Inject 70 Units into the skin at bedtime.   insulin lispro 100 UNIT/ML injection Commonly known as:  HUMALOG Inject 0.15 mLs (15 Units total) into the skin 2 (two) times daily with a meal for 30 days. Hold humalog if unable to eat What changed:     when to take this  additional instructions   Insulin Pen Needle 31G X 5 MM Misc Commonly known as:  B-D UF III MINI PEN NEEDLES Use as instructed. Monitor blood glucose levels twice per day   levETIRAcetam 500 MG tablet Commonly known as:  KEPPRA Take 1 tablet (500 mg total) by mouth 2 (two) times daily for 30 days.   pantoprazole 40 MG tablet Commonly known as:  PROTONIX Take 1 tablet (40 mg total) by mouth 2 (two) times daily.   promethazine 25 MG tablet Commonly known as:  PHENERGAN Take 1 tablet (25 mg total) by mouth every 6 (six) hours as needed for nausea or vomiting.   propranolol 20 MG tablet Commonly known as:  INDERAL Take 1 tablet (20 mg total) by mouth 2 (two) times daily.   QUEtiapine 100 MG tablet Commonly known as:  SEROQUEL Take 300 mg by mouth at bedtime.   True Metrix Meter w/Device Kit Use as instructed. Monitor blood glucose levels twice per day   TRUEplus Lancets 28G Misc Use as instructed. Monitor blood glucose levels twice per day      Follow-up [facility] Follow up.   Contact information: [place] [id number] [phone]         Allergies  Allergen Reactions  . Ibuprofen Other (See Comments)    GI MD said to not take this anymore  Consultations: Neurology   Procedures/Studies: Dg Chest 2 View  Result Date: 06/14/2018 CLINICAL DATA:  Chest pain EXAM: CHEST - 2 VIEW COMPARISON:  April 14, 2018 FINDINGS: Lungs are clear. Heart size and pulmonary vascularity are normal. No adenopathy. No pneumothorax. No bone lesions. IMPRESSION: No edema or consolidation. Electronically Signed   By: Lowella Grip III M.D.   On: 06/14/2018 16:04   Ct Head Wo Contrast  Result Date: 06/14/2018 CLINICAL DATA:  Multiple seizures, posterior head trauma EXAM: CT HEAD WITHOUT CONTRAST TECHNIQUE: Contiguous axial images were obtained from the base of the skull through  the vertex without intravenous contrast. COMPARISON:  11/23/2017 FINDINGS: Brain: No evidence of acute infarction, hemorrhage, hydrocephalus, extra-axial collection or mass lesion/mass effect. Vascular: No hyperdense vessel or unexpected calcification. Skull: Normal. Negative for fracture or focal lesion. Sinuses/Orbits: No acute finding. Other: None IMPRESSION: Negative for bleed or other acute intracranial process. Electronically Signed   By: Lucrezia Europe M.D.   On: 06/14/2018 17:46    Subjective: No acute issues or events overnight, no further seizure-like activity, patient awake alert oriented x4, no overt deficits, mental status back to baseline.  Otherwise requesting discharge.   Discharge Exam: Vitals:   06/15/18 0900 06/15/18 1200  BP: 101/69 96/72  Pulse: 91 84  Resp: 17 18  Temp: 98.4 F (36.9 C) 98.9 F (37.2 C)  SpO2: 100% 100%   Vitals:   06/14/18 2348 06/15/18 0329 06/15/18 0900 06/15/18 1200  BP: 99/69 94/69 101/69 96/72  Pulse: (!) 128 99 91 84  Resp: '18 18 17 18  '$ Temp: 98.3 F (36.8 C) 98 F (36.7 C) 98.4 F (36.9 C) 98.9 F (37.2 C)  TempSrc: Oral Oral Oral Oral  SpO2: 99% 100% 100% 100%  Weight:      Height:        General:  Pleasantly resting in bed, No acute distress. HEENT:  Normocephalic atraumatic.  Sclerae nonicteric, noninjected.  Extraocular movements intact bilaterally. Neck:  Without mass or deformity.  Trachea is midline. Lungs:  Clear to auscultate bilaterally without rhonchi, wheeze, or rales. Heart:  Regular rate and rhythm.  Without murmurs, rubs, or gallops. Abdomen:  Soft, nontender, nondistended.  Without guarding or rebound. Extremities: Without cyanosis, clubbing, edema, or obvious deformity. Vascular:  Dorsalis pedis and posterior tibial pulses palpable bilaterally. Skin:  Warm and dry, no erythema, no ulcerations.   The results of significant diagnostics from this hospitalization (including imaging, microbiology, ancillary and  laboratory) are listed below for reference.     Microbiology: Recent Results (from the past 240 hour(s))  SARS Coronavirus 2 (CEPHEID - Performed in [place] hospital lab), Hosp Order     Status: None   Collection Time: 06/14/18  8:00 PM  Result Value Ref Range Status   SARS Coronavirus 2 NEGATIVE NEGATIVE Final    Comment: (NOTE) If result is NEGATIVE SARS-CoV-2 target nucleic acids are NOT DETECTED. The SARS-CoV-2 RNA is generally detectable in upper and lower  respiratory specimens during the acute phase of infection. The lowest  concentration of SARS-CoV-2 viral copies this assay can detect is 250  copies / mL. A negative result does not preclude SARS-CoV-2 infection  and should not be used as the sole basis for treatment or other  patient management decisions.  A negative result may occur with  improper specimen collection / handling, submission of specimen other  than nasopharyngeal swab, presence of viral mutation(s) within the  areas targeted by this assay, and inadequate number of viral  copies  (<250 copies / mL). A negative result must be combined with clinical  observations, patient history, and epidemiological information. If result is POSITIVE SARS-CoV-2 target nucleic acids are DETECTED. The SARS-CoV-2 RNA is generally detectable in upper and lower  respiratory specimens dur ing the acute phase of infection.  Positive  results are indicative of active infection with SARS-CoV-2.  Clinical  correlation with patient history and other diagnostic information is  necessary to determine patient infection status.  Positive results do  not rule out bacterial infection or co-infection with other viruses. If result is PRESUMPTIVE POSTIVE SARS-CoV-2 nucleic acids MAY BE PRESENT.   A presumptive positive result was obtained on the submitted specimen  and confirmed on repeat testing.  While 2019 novel coronavirus  (SARS-CoV-2) nucleic acids may be present in the submitted  sample  additional confirmatory testing may be necessary for epidemiological  and / or clinical management purposes  to differentiate between  SARS-CoV-2 and other Sarbecovirus currently known to infect humans.  If clinically indicated additional testing with an alternate test  methodology [phone]) is advised. The SARS-CoV-2 RNA is generally  detectable in upper and lower respiratory sp ecimens during the acute  phase of infection. The expected result is Negative. Fact Sheet for Patients:  StrictlyIdeas.no Fact Sheet for Healthcare Providers: BankingDealers.co.za This test is not yet approved or cleared by the [place] FDA and has been authorized for detection and/or diagnosis of SARS-CoV-2 by FDA under an Emergency Use Authorization (EUA).  This EUA will remain in effect (meaning this test can be used) for the duration of the COVID-19 declaration under Section 564(b)(1) of the Act, 21 U.S.C. section 360bbb-3(b)(1), unless the authorization is terminated or revoked sooner. Performed at [facility], [place] [address], [place], [place] [address]      Labs: BNP (last 3 results) Recent Labs    01/16/18 1830 01/24/18 1313 01/29/18 2134  BNP 5.6 4.8 48.0   Basic Metabolic Panel: Recent Labs  Lab 06/14/18 1330 06/15/18 0351  NA 133* 136  K 4.3 3.7  CL 96* 103  CO2 23 24  GLUCOSE 495* 288*  BUN 10 14  CREATININE 0.68 0.47  CALCIUM 9.4 8.7*   Liver Function Tests: Recent Labs  Lab 06/15/18 0351  AST 13*  ALT 12  ALKPHOS 57  BILITOT 0.4  PROT 5.6*  ALBUMIN 3.2*   No results for input(s): LIPASE, AMYLASE in the last 168 hours. No results for input(s): AMMONIA in the last 168 hours. CBC: Recent Labs  Lab 06/14/18 1330 06/15/18 0351  WBC 7.3 8.0  HGB 14.6 13.5  HCT 45.4 40.9  MCV 86.0 85.4  PLT 387 333   Cardiac Enzymes: No results for input(s): CKTOTAL, CKMB, CKMBINDEX, TROPONINI in the last 168  hours. BNP: Invalid input(s): POCBNP CBG: Recent Labs  Lab 06/14/18 1624 06/14/18 1743 06/14/18 2234 06/15/18 0709 06/15/18 1126  GLUCAP 325* 266* 220* 237* 299*   D-Dimer Recent Labs    06/14/18 1617  DDIMER <0.27   Hgb A1c No results for input(s): HGBA1C in the last 72 hours. Lipid Profile No results for input(s): CHOL, HDL, LDLCALC, TRIG, CHOLHDL, LDLDIRECT in the last 72 hours. Thyroid function studies No results for input(s): TSH, T4TOTAL, T3FREE, THYROIDAB in the last 72 hours.  Invalid input(s): FREET3 Anemia work up No results for input(s): VITAMINB12, FOLATE, FERRITIN, TIBC, IRON, RETICCTPCT in the last 72 hours. Urinalysis    Component Value Date/Time   COLORURINE YELLOW 06/14/2018 1630  APPEARANCEUR HAZY (A) 06/14/2018 1630   LABSPEC 1.032 (H) 06/14/2018 1630   PHURINE 5.0 06/14/2018 1630   GLUCOSEU >=500 (A) 06/14/2018 1630   HGBUR SMALL (A) 06/14/2018 1630   BILIRUBINUR NEGATIVE 06/14/2018 1630   BILIRUBINUR negative 02/15/2018 1617   KETONESUR NEGATIVE 06/14/2018 1630   PROTEINUR NEGATIVE 06/14/2018 1630   UROBILINOGEN 0.2 02/15/2018 1617   UROBILINOGEN 1.0 07/09/2017 1324   NITRITE NEGATIVE 06/14/2018 1630   LEUKOCYTESUR NEGATIVE 06/14/2018 1630   Sepsis Labs Invalid input(s): PROCALCITONIN,  WBC,  LACTICIDVEN Microbiology Recent Results (from the past 240 hour(s))  SARS Coronavirus 2 (CEPHEID - Performed in [place] hospital lab), Hosp Order     Status: None   Collection Time: 06/14/18  8:00 PM  Result Value Ref Range Status   SARS Coronavirus 2 NEGATIVE NEGATIVE Final    Comment: (NOTE) If result is NEGATIVE SARS-CoV-2 target nucleic acids are NOT DETECTED. The SARS-CoV-2 RNA is generally detectable in upper and lower  respiratory specimens during the acute phase of infection. The lowest  concentration of SARS-CoV-2 viral copies this assay can detect is 250  copies / mL. A negative result does not preclude SARS-CoV-2 infection  and  should not be used as the sole basis for treatment or other  patient management decisions.  A negative result may occur with  improper specimen collection / handling, submission of specimen other  than nasopharyngeal swab, presence of viral mutation(s) within the  areas targeted by this assay, and inadequate number of viral copies  (<250 copies / mL). A negative result must be combined with clinical  observations, patient history, and epidemiological information. If result is POSITIVE SARS-CoV-2 target nucleic acids are DETECTED. The SARS-CoV-2 RNA is generally detectable in upper and lower  respiratory specimens dur ing the acute phase of infection.  Positive  results are indicative of active infection with SARS-CoV-2.  Clinical  correlation with patient history and other diagnostic information is  necessary to determine patient infection status.  Positive results do  not rule out bacterial infection or co-infection with other viruses. If result is PRESUMPTIVE POSTIVE SARS-CoV-2 nucleic acids MAY BE PRESENT.   A presumptive positive result was obtained on the submitted specimen  and confirmed on repeat testing.  While 2019 novel coronavirus  (SARS-CoV-2) nucleic acids may be present in the submitted sample  additional confirmatory testing may be necessary for epidemiological  and / or clinical management purposes  to differentiate between  SARS-CoV-2 and other Sarbecovirus currently known to infect humans.  If clinically indicated additional testing with an alternate test  methodology [phone]) is advised. The SARS-CoV-2 RNA is generally  detectable in upper and lower respiratory sp ecimens during the acute  phase of infection. The expected result is Negative. Fact Sheet for Patients:  StrictlyIdeas.no Fact Sheet for Healthcare Providers: BankingDealers.co.za This test is not yet approved or cleared by the [place] FDA and has been  authorized for detection and/or diagnosis of SARS-CoV-2 by FDA under an Emergency Use Authorization (EUA).  This EUA will remain in effect (meaning this test can be used) for the duration of the COVID-19 declaration under Section 564(b)(1) of the Act, 21 U.S.C. section 360bbb-3(b)(1), unless the authorization is terminated or revoked sooner. Performed at [facility], [place] [address], [place], [place] [address]      Time coordinating discharge: Over 30 minutes  SIGNED:   Little Ishikawa, DO [facility] 06/15/2018, 1:26 PM Pager   If 7PM-7AM, please contact night-coverage [email or web address] Password  [email or web address]

## 2018-06-20 ENCOUNTER — Emergency Department (HOSPITAL_COMMUNITY)
Admission: EM | Admit: 2018-06-20 | Discharge: 2018-06-21 | Disposition: A | Discharge status: Home / Self Care | Payer: Medicaid Other | Attending: Emergency Medicine | Admitting: Emergency Medicine

## 2018-06-20 ENCOUNTER — Encounter (HOSPITAL_COMMUNITY): Payer: Self-pay | Admitting: Emergency Medicine

## 2018-06-20 ENCOUNTER — Other Ambulatory Visit: Payer: Self-pay

## 2018-06-20 ENCOUNTER — Emergency Department (HOSPITAL_COMMUNITY): Payer: Medicaid Other

## 2018-06-20 DIAGNOSIS — I110 Hypertensive heart disease with heart failure: Secondary | ICD-10-CM | POA: Insufficient documentation

## 2018-06-20 DIAGNOSIS — E109 Type 1 diabetes mellitus without complications: Secondary | ICD-10-CM | POA: Insufficient documentation

## 2018-06-20 DIAGNOSIS — Z794 Long term (current) use of insulin: Secondary | ICD-10-CM | POA: Diagnosis not present

## 2018-06-20 DIAGNOSIS — I509 Heart failure, unspecified: Secondary | ICD-10-CM | POA: Insufficient documentation

## 2018-06-20 DIAGNOSIS — R569 Unspecified convulsions: Secondary | ICD-10-CM

## 2018-06-20 DIAGNOSIS — Z79899 Other long term (current) drug therapy: Secondary | ICD-10-CM | POA: Diagnosis not present

## 2018-06-20 DIAGNOSIS — R1084 Generalized abdominal pain: Secondary | ICD-10-CM | POA: Insufficient documentation

## 2018-06-20 HISTORY — DX: Unspecified convulsions: R56.9

## 2018-06-20 LAB — I-STAT BETA HCG BLOOD, ED (MC, WL, AP ONLY): I-stat hCG, quantitative: <5 m[IU]/mL (ref <5)

## 2018-06-20 LAB — TROPONIN I: Troponin I: <0.03 ng/mL (ref <0.03)

## 2018-06-20 LAB — CBC
HCT: 41.9 % (ref 36.0–46.0)
Hemoglobin: 13.7 g/dL (ref 12.0–15.0)
MCH: 28.2 pg (ref 26.0–34.0)
MCHC: 32.7 g/dL (ref 30.0–36.0)
MCV: 86.4 fL (ref 80.0–100.0)
Platelets: 346 10*3/uL (ref 150–400)
RBC: 4.85 MIL/uL (ref 3.87–5.11)
RDW: 14 % (ref 11.5–15.5)
WBC: 15.5 10*3/uL — ABNORMAL HIGH (ref 4.0–10.5)
nRBC: 0 % (ref 0.0–0.2)

## 2018-06-20 LAB — BASIC METABOLIC PANEL
Anion gap: 15 (ref 5–15)
BUN: 6 mg/dL (ref 6–20)
CO2: 18 mmol/L — ABNORMAL LOW (ref 22–32)
Calcium: 9.1 mg/dL (ref 8.9–10.3)
Chloride: 99 mmol/L (ref 98–111)
Creatinine, Ser: 0.85 mg/dL (ref 0.44–1.00)
GFR calc Af Amer: >60 mL/min (ref >60)
GFR calc non Af Amer: >60 mL/min (ref >60)
Glucose, Bld: 624 mg/dL (ref 70–99)
Potassium: 4.5 mmol/L (ref 3.5–5.1)
Sodium: 132 mmol/L — ABNORMAL LOW (ref 135–145)

## 2018-06-20 LAB — PROTIME-INR
INR: 1 (ref 0.8–1.2)
Prothrombin Time: 12.8 seconds (ref 11.4–15.2)

## 2018-06-20 MED ORDER — SODIUM CHLORIDE 0.9% FLUSH: 3.0000 mL | Freq: Once | INTRAVENOUS | Status: DC

## 2018-06-20 NOTE — ED Triage Notes (Signed)
Patient reports seizures today x3 with headache and chest pain with SOB , she received 324 mg ASA po and 1 NTG sl prior to arrival with no relief , rates chest pain 10/10 . Pt. stated she hit her head on the floor . Alert and oriented x4 at arrival /respirations unlabored.

## 2018-06-21 ENCOUNTER — Other Ambulatory Visit: Payer: Self-pay

## 2018-06-21 ENCOUNTER — Emergency Department (HOSPITAL_COMMUNITY): Payer: Medicaid Other

## 2018-06-21 LAB — CBG MONITORING, ED
Glucose-Capillary: 455 mg/dL — ABNORMAL HIGH (ref 70–99)
Glucose-Capillary: 321 mg/dL — ABNORMAL HIGH (ref 70–99)

## 2018-06-21 MED ORDER — MORPHINE SULFATE (PF) 4 MG/ML IV SOLN
4.0000 mg | Freq: Once | INTRAVENOUS | Status: AC
  Administered 2018-06-21: 02:00:00 4 mg via INTRAVENOUS
  Filled 2018-06-21: qty 1

## 2018-06-21 MED ORDER — IOHEXOL 300 MG/ML  SOLN
100.0000 mL | Freq: Once | INTRAMUSCULAR | Status: AC | PRN
  Administered 2018-06-21: 100 mL via INTRAVENOUS

## 2018-06-21 MED ORDER — INSULIN ASPART 100 UNIT/ML ~~LOC~~ SOLN
10.0000 [IU] | Freq: Once | SUBCUTANEOUS | Status: AC
  Administered 2018-06-21: 10 [IU] via SUBCUTANEOUS

## 2018-06-21 MED ORDER — SODIUM CHLORIDE 0.9 % IV BOLUS
500.0000 mL | Freq: Once | INTRAVENOUS | Status: AC
  Administered 2018-06-21: 500 mL via INTRAVENOUS

## 2018-06-21 NOTE — ED Provider Notes (Signed)
[facility] EMERGENCY DEPARTMENT Provider Note   CSN: [id number] Arrival date & time: 06/20/18  2218     History   Chief Complaint Chief Complaint  Patient presents with   Seizures   Chest Pain    HPI Nancy [name] is a 18 y.o. female.     Patient is a 18 year old female with past medical history of type 1 diabetes and seizure disorder.  She presents today with multiple complaints.  She describes generalized chest and abdominal pain, headache, and reports having had 3 seizures today.  Each of these episodes consists of her losing consciousness shaking for approximately 1 minute, then returning to baseline.  She tells me that on one occasion she "wet herself".  She denies any fevers or chills.  Patient was just recently admitted with similar issues.  She was started on Keppra after having an unremarkable EEG.  She reports being compliant with this medication.  The history is provided by the patient.  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[place])    Premature baby    Seizures ([place])    Type II diabetes mellitus ([place])    insulin dependant    Patient Active Problem List   Diagnosis Date Noted   Seizure disorder ([place]) 06/14/2018   Syncope 01/30/2018   Orthostatic hypotension 01/24/2018   DKA (diabetic ketoacidosis) ([place]) 01/24/2018   Chronic abdominal pain 12/24/2017   Sinus tachycardia by electrocardiogram    Acute lower UTI     Uncontrolled type 2 diabetes mellitus with hyperglycemia ([place])    Chest pain 12/19/2017   Generalized abdominal pain 08/21/2017   Non compliance with medical treatment 01/27/2012   Adjustment disorder 09/16/2011   DM (diabetes mellitus), type 1 ([place]) 09/15/2011   Dyspepsia    Acanthosis nigricans    Goiter    Obesity 06/14/2010   Hypertension 06/14/2010    Past Surgical History:  Procedure Laterality Date   ABDOMINAL HERNIA REPAIR     "I was a baby"   HERNIA REPAIR     TONSILLECTOMY AND ADENOIDECTOMY     WISDOM TOOTH EXTRACTION  2017     OB History   No obstetric history on file.      Home Medications    Prior to Admission medications   Medication Sig Start Date End Date Taking? Authorizing Provider  diclofenac sodium (VOLTAREN) 1 % GEL Apply 2 g topically 3 (three) times daily as needed (as directed to painful sites).    Yes [provider]  escitalopram (LEXAPRO) 10 MG tablet Take 2 tablets (20 mg total) by mouth daily. 05/25/18  Yes [name], NP  FLUoxetine (PROZAC) 20 MG capsule Take 1 capsule (20 mg total) by mouth at bedtime. 05/25/18  Yes [name], NP  gabapentin (NEURONTIN) 300 MG capsule Take 1 capsule (300 mg total) by mouth at bedtime for 30 days. 06/02/18 07/02/18 Yes  [name], NP  glucagon 1 MG injection Use for Severe Hypoglycemia . Inject 1 mg intramuscularly if unresponsive, unable to swallow, unconscious and/or has seizure Patient taking differently: Inject 1 mg into the muscle once as needed (for severe hypoglycemia- if unresponsive, unable to swallow, unconscious, and/or has had seizure(s)).  07/20/13 12/19/25 Yes Renato Shin, MD  Insulin Glargine (LANTUS) 100 UNIT/ML Solostar Pen Inject 70 Units into the skin at bedtime. 02/15/18  Yes [name], NP  insulin lispro (HUMALOG) 100 UNIT/ML injection Inject 0.15 mLs (15 Units total) into the skin 2 (two) times daily with a meal for 30 days. Hold humalog if unable  to eat Patient taking differently: Inject 15 Units into the skin See admin instructions. Inject 15 units into the skin two times a day before meals and HOLD IF UNABLE TO EAT 02/15/18 06/20/18 Yes [name], NP  levETIRAcetam (KEPPRA) 500 MG tablet Take 1 tablet (500 mg total) by mouth 2 (two) times daily for 30 days. 06/15/18 07/15/18 Yes Little Ishikawa, MD  pantoprazole (PROTONIX) 40 MG tablet Take 1 tablet (40 mg total) by mouth 2 (two) times daily. 02/01/18  Yes Geradine Girt, DO  propranolol (INDERAL) 20 MG tablet Take 1 tablet (20 mg total) by mouth 2 (two) times daily. 02/01/18  Yes Vann, Jessica U, DO  QUEtiapine (SEROQUEL) 100 MG tablet Take 300 mg by mouth at bedtime.    Yes [provider]  traMADol (ULTRAM) 50 MG tablet Take 50 mg by mouth every 6 (six) hours as needed (for pain).   Yes [provider]  Blood Glucose Monitoring Suppl (TRUE METRIX METER) w/Device KIT Use as instructed. Monitor blood glucose levels twice per day 12/02/17   [name], NP  glucose blood (TRUE METRIX BLOOD GLUCOSE TEST) test strip Use as instructed 02/15/18   [name], NP  Insulin Pen Needle (B-D UF III MINI PEN NEEDLES) 31G X 5 MM MISC Use as instructed. Monitor blood glucose levels twice per day 02/15/18   [name], NP  promethazine (PHENERGAN) 25 MG tablet Take 1 tablet (25 mg total) by mouth every 6 (six) hours as needed for nausea or vomiting. Patient not taking: Reported on 06/20/2018 04/15/18   Dalia Heading, PA-C  TRUEPLUS LANCETS 28G MISC Use as instructed. Monitor blood glucose levels twice per day 12/02/17   [name], NP    Family History Family History  Problem Relation Age of Onset   Diabetes Mother    Hypertension Mother    Obesity Mother    Asthma Mother    Allergic rhinitis Mother    Eczema Mother    Diabetes Father    Hypertension Father    Obesity Father    Hyperlipidemia Father    Hypertension Paternal Aunt     Hypertension Maternal Grandfather    Colon cancer Maternal Grandfather    Diabetes Paternal Grandmother    Obesity Paternal Grandmother    Diabetes Paternal Grandfather    Obesity Paternal Grandfather    Angioedema Neg Hx    Immunodeficiency Neg Hx    Urticaria Neg Hx    Stomach cancer Neg Hx    Esophageal cancer Neg Hx     Social History Social History   Tobacco Use   Smoking status: Never Smoker   Smokeless tobacco: Never Used  Substance Use Topics   Alcohol use: No    Alcohol/week: 0.0 standard drinks   Drug use: No  Allergies   Ibuprofen   Review of Systems Review of Systems  Cardiovascular: Positive for chest pain.  Neurological: Positive for seizures.  All other systems reviewed and are negative.    Physical Exam Updated Vital Signs BP (!) 128/91 (BP Location: Left Arm)    Pulse (!) 110    Temp 99.3 F (37.4 C) (Oral)    Resp 18    LMP 06/07/2018 (Exact Date)    SpO2 100%   Physical Exam Vitals signs and nursing note reviewed.  Constitutional:      General: She is not in acute distress.    Appearance: She is well-developed. She is not diaphoretic.  HENT:     Head: Normocephalic and atraumatic.  Neck:     Musculoskeletal: Normal range of motion and neck supple.  Cardiovascular:     Rate and Rhythm: Normal rate and regular rhythm.     Heart sounds: No murmur. No friction rub. No gallop.   Pulmonary:     Effort: Pulmonary effort is normal. No respiratory distress.     Breath sounds: Normal breath sounds. No wheezing.  Abdominal:     General: Bowel sounds are normal. There is no distension.     Palpations: Abdomen is soft.     Tenderness: There is no abdominal tenderness.  Musculoskeletal: Normal range of motion.  Skin:    General: Skin is warm and dry.  Neurological:     Mental Status: She is alert and oriented to person, place, and time.      ED Treatments / Results  Labs (all labs ordered are listed, but only abnormal  results are displayed) Labs Reviewed  BASIC METABOLIC PANEL - Abnormal; Notable for the following components:      Result Value   Sodium 132 (*)    CO2 18 (*)    Glucose, Bld 624 (*)    All other components within normal limits  CBC - Abnormal; Notable for the following components:   WBC 15.5 (*)    All other components within normal limits  CBG MONITORING, ED - Abnormal; Notable for the following components:   Glucose-Capillary 455 (*)    All other components within normal limits  TROPONIN I  PROTIME-INR  I-STAT BETA HCG BLOOD, ED ([facility], [facility], [facility] ONLY)    EKG    Radiology Dg Chest 2 View  Result Date: 06/20/2018 CLINICAL DATA:  Patient reports seizures today x3 with headache and chest pain with SOB , she received 324 mg ASA po and 1 NTG sl prior to arrival with no relief , rates chest pain 10/10 . Pt. stated she hit her head on the floor EXAM: CHEST - 2 VIEW COMPARISON:  06/14/2018 FINDINGS: Normal heart, mediastinum and hila. Lungs clear and symmetrically aerated. No pleural effusion or pneumothorax. Skeletal structures are within normal limits. IMPRESSION: Normal chest radiographs. Electronically Signed   By: Lajean Manes M.D.   On: 06/20/2018 22:59   Ct Head Wo Contrast  Result Date: 06/20/2018 CLINICAL DATA:  Seizure EXAM: CT HEAD WITHOUT CONTRAST TECHNIQUE: Contiguous axial images were obtained from the base of the skull through the vertex without intravenous contrast. COMPARISON:  06/14/2018 FINDINGS: Brain: No acute intracranial abnormality. Specifically, no hemorrhage, hydrocephalus, mass lesion, acute infarction, or significant intracranial injury. Vascular: No hyperdense vessel or unexpected calcification. Skull: No acute calvarial abnormality. Sinuses/Orbits: Visualized paranasal sinuses and mastoids clear. Orbital soft tissues unremarkable. Other: None IMPRESSION: Normal study. Electronically Signed   By: Rolm Baptise M.D.   On:  06/20/2018 23:03    Procedures Procedures  (including critical care time)  Medications Ordered in ED Medications  sodium chloride flush (NS) 0.9 % injection 3 mL (has no administration in time range)  sodium chloride 0.9 % bolus 500 mL (has no administration in time range)  insulin aspart (novoLOG) injection 10 Units (has no administration in time range)     Initial Impression / Assessment and Plan / ED Course  I have reviewed the triage vital signs and the nursing notes.  Pertinent labs & imaging results that were available during my care of the patient were reviewed by me and considered in my medical decision making (see chart for details).  Patient presenting with complaints of headache, chest, and abdominal pain.  She also reports experiencing 3 seizures today.  Patient recently admitted for seizures and started on Keppra.  Today's work-up is essentially unremarkable with the exception of an elevated glucose.  Patient was given fluids and insulin and this has improved.  She does have a slight elevation of her white count, however CT scan of the abdomen and pelvis are unremarkable.  Patient has had no further seizure activity while in the ER.  I have discussed the care with Dr. Cheral Marker from neurology.  He is familiar with this patient as he saw her during her recent admission.  His recommendation is to increase her Keppra to 1000 mg twice daily.  She is to follow-up with neurology as an outpatient.  Final Clinical Impressions(s) / ED Diagnoses   Final diagnoses:  None    ED Discharge Orders    None       Veryl Speak, MD 06/21/18 [phone]

## 2018-06-21 NOTE — Discharge Instructions (Signed)
Increase your Keppra to 1000 mg twice daily.  Follow-up with neurology in the next week.

## 2018-06-21 NOTE — ED Notes (Signed)
Patient transported to CT 

## 2018-07-04 ENCOUNTER — Emergency Department (HOSPITAL_COMMUNITY)
Admission: EM | Admit: 2018-07-04 | Discharge: 2018-07-05 | Disposition: A | Discharge status: Home / Self Care | Payer: Medicaid Other | Attending: Emergency Medicine | Admitting: Emergency Medicine

## 2018-07-04 ENCOUNTER — Encounter (HOSPITAL_COMMUNITY): Payer: Self-pay

## 2018-07-04 ENCOUNTER — Other Ambulatory Visit: Payer: Self-pay

## 2018-07-04 DIAGNOSIS — R569 Unspecified convulsions: Secondary | ICD-10-CM | POA: Diagnosis not present

## 2018-07-04 DIAGNOSIS — R079 Chest pain, unspecified: Secondary | ICD-10-CM | POA: Insufficient documentation

## 2018-07-04 DIAGNOSIS — I509 Heart failure, unspecified: Secondary | ICD-10-CM | POA: Insufficient documentation

## 2018-07-04 DIAGNOSIS — R1084 Generalized abdominal pain: Secondary | ICD-10-CM | POA: Diagnosis present

## 2018-07-04 DIAGNOSIS — Z794 Long term (current) use of insulin: Secondary | ICD-10-CM | POA: Diagnosis not present

## 2018-07-04 DIAGNOSIS — R739 Hyperglycemia, unspecified: Secondary | ICD-10-CM

## 2018-07-04 DIAGNOSIS — E1165 Type 2 diabetes mellitus with hyperglycemia: Secondary | ICD-10-CM | POA: Diagnosis not present

## 2018-07-04 DIAGNOSIS — Z79899 Other long term (current) drug therapy: Secondary | ICD-10-CM | POA: Insufficient documentation

## 2018-07-04 DIAGNOSIS — G8929 Other chronic pain: Secondary | ICD-10-CM | POA: Insufficient documentation

## 2018-07-04 LAB — COMPREHENSIVE METABOLIC PANEL
ALT: 10 U/L (ref 0–44)
AST: 11 U/L — ABNORMAL LOW (ref 15–41)
Albumin: 3.6 g/dL (ref 3.5–5.0)
Alkaline Phosphatase: 55 U/L (ref 38–126)
Anion gap: 10 (ref 5–15)
BUN: 8 mg/dL (ref 6–20)
CO2: 25 mmol/L (ref 22–32)
Calcium: 9 mg/dL (ref 8.9–10.3)
Chloride: 101 mmol/L (ref 98–111)
Creatinine, Ser: 0.67 mg/dL (ref 0.44–1.00)
GFR calc Af Amer: >60 mL/min (ref >60)
GFR calc non Af Amer: >60 mL/min (ref >60)
Glucose, Bld: 355 mg/dL — ABNORMAL HIGH (ref 70–99)
Potassium: 3.6 mmol/L (ref 3.5–5.1)
Sodium: 136 mmol/L (ref 135–145)
Total Bilirubin: 0.4 mg/dL (ref 0.3–1.2)
Total Protein: 6 g/dL — ABNORMAL LOW (ref 6.5–8.1)

## 2018-07-04 LAB — I-STAT BETA HCG BLOOD, ED (MC, WL, AP ONLY): I-stat hCG, quantitative: <5 m[IU]/mL (ref <5)

## 2018-07-04 LAB — CBC
HCT: 40.3 % (ref 36.0–46.0)
Hemoglobin: 13.2 g/dL (ref 12.0–15.0)
MCH: 27.7 pg (ref 26.0–34.0)
MCHC: 32.8 g/dL (ref 30.0–36.0)
MCV: 84.7 fL (ref 80.0–100.0)
Platelets: 325 10*3/uL (ref 150–400)
RBC: 4.76 MIL/uL (ref 3.87–5.11)
RDW: 13.9 % (ref 11.5–15.5)
WBC: 9.3 10*3/uL (ref 4.0–10.5)
nRBC: 0 % (ref 0.0–0.2)

## 2018-07-04 LAB — LIPASE, BLOOD: Lipase: 25 U/L (ref 11–51)

## 2018-07-04 MED ORDER — SODIUM CHLORIDE 0.9% FLUSH: 3.0000 mL | Freq: Once | INTRAVENOUS | Status: DC

## 2018-07-04 MED ORDER — SODIUM CHLORIDE 0.9 % IV BOLUS (SEPSIS): 1000.0000 mL | Freq: Once | INTRAVENOUS | Status: DC

## 2018-07-04 MED ORDER — DICYCLOMINE HCL 10 MG/ML IM SOLN
20.0000 mg | Freq: Once | INTRAMUSCULAR | Status: AC
  Administered 2018-07-05: 20 mg via INTRAMUSCULAR
  Filled 2018-07-04: qty 2

## 2018-07-04 MED ORDER — SODIUM CHLORIDE 0.9 % IV SOLN
2000.0000 mg | Freq: Once | INTRAVENOUS | Status: AC
  Administered 2018-07-05: 2000 mg via INTRAVENOUS
  Filled 2018-07-04: qty 20

## 2018-07-04 MED ORDER — ONDANSETRON HCL 4 MG/2ML IJ SOLN
4.0000 mg | Freq: Once | INTRAMUSCULAR | Status: AC
  Administered 2018-07-05: 4 mg via INTRAVENOUS
  Filled 2018-07-04: qty 2

## 2018-07-04 NOTE — ED Provider Notes (Signed)
TIME SEEN: 11:37 PM  CHIEF COMPLAINT: seizures  HPI: Patient is a 18 year old female with history of insulin-dependent diabetes, CHF, seizures on Keppra, chronic chest pain who presents to the emergency department with complaints of 4 seizures today.  She states that they are generalized tonic-clonic seizures that last for a few minutes.  She had an episode of incontinence.  She states she is post ictal afterwards.  They were witnessed by her mother.  She is complaining of continued central chest pain that she describes as a sharp, tight pain that is intermittent and last for several seconds and then resolves.  She does feel short of breath with this pain.  This has been ongoing for weeks she has had work-up including negative cardiac CT and negative CTA to evaluate for PE.  She does have an EF of 45 to 50%.  No history of PE, DVT, exogenous estrogen use, recent fractures, surgery, trauma, hospitalization or prolonged travel. No lower extremity swelling or pain. No calf tenderness.  She also reports that she has had 3 days of vomiting and 2 days of mid crampy abdominal pain.  No diarrhea.  No dysuria, hematuria, vaginal bleeding or discharge.  No fevers.  No cough.  Does not think she has been exposed to coronavirus.  She states that she has been taking her Keppra and states she is taking 2000 mg twice a day.  She states that she is followed by outpatient neurology but she cannot remember the name of the doctor.  She states her seizures are not well controlled and her last seizure was a week ago.  Echo 01/2018:  Study Conclusions  - Left ventricle: The cavity size was normal. Systolic function was   mildly reduced. The estimated ejection fraction was in the range   of 45% to 50%. Diffuse hypokinesis.  Cardiac CT 01/2018:  IMPRESSION: 1. Coronary calcium score of 0. This was 0 percentile for age and sex matched control.  2. Normal coronary origin with right dominance.  3. No evidence of CAD.   Consider non-cardiac causes of chest pain.  12/2017 CTA chest:  FINDINGS: Cardiovascular: Heart is normal size. Aorta is normal caliber. No filling defects in the pulmonary arteries to suggest pulmonary emboli.  Mediastinum/Nodes: No mediastinal, hilar, or axillary adenopathy.  Lungs/Pleura: Lungs are clear. No focal airspace opacities or suspicious nodules. No effusions.  Upper Abdomen: Imaging into the upper abdomen shows no acute findings.  Musculoskeletal: Chest wall soft tissues are unremarkable. No acute bony abnormality.  Review of the MIP images confirms the above findings.  IMPRESSION: No acute cardiopulmonary disease.      ROS: See HPI Constitutional: no fever  Eyes: no drainage  ENT: no runny nose   Cardiovascular:   chest pain  Resp:  SOB  GI:  vomiting GU: no dysuria Integumentary: no rash  Allergy: no hives  Musculoskeletal: no leg swelling  Neurological: no slurred speech ROS otherwise negative  PAST MEDICAL HISTORY/PAST SURGICAL HISTORY:  Past Medical History:  Diagnosis Date  . Acanthosis nigricans   . Anxiety   . CHF (congestive heart failure) ([place])   . Chronic lower back pain   . Depression   . Dyspepsia   . Obesity   . Ovarian cyst    pt is not aware of this hx (11/24/2017)  . Pre-diabetes   . 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 Premature baby   . Seizures ([place])   . Type II diabetes mellitus (HCC)    insulin dependant  MEDICATIONS:  Prior to Admission medications   Medication Sig Start Date End Date Taking? Authorizing Provider  Blood Glucose Monitoring Suppl (TRUE METRIX METER) w/Device KIT Use as instructed. Monitor blood glucose levels twice per day 12/02/17   [name], NP  diclofenac sodium (VOLTAREN) 1 % GEL Apply 2 g topically 3 (three) times daily as needed (as directed to painful sites).     [provider]  escitalopram (LEXAPRO) 10 MG tablet Take 2 tablets (20 mg total) by mouth daily. 05/25/18    [name], NP  FLUoxetine (PROZAC) 20 MG capsule Take 1 capsule (20 mg total) by mouth at bedtime. 05/25/18   [name], NP  gabapentin (NEURONTIN) 300 MG capsule Take 1 capsule (300 mg total) by mouth at bedtime for 30 days. 06/02/18 07/02/18  [name], NP  glucagon 1 MG injection Use for Severe Hypoglycemia . Inject 1 mg intramuscularly if unresponsive, unable to swallow, unconscious and/or has seizure Patient taking differently: Inject 1 mg into the muscle once as needed (for severe hypoglycemia- if unresponsive, unable to swallow, unconscious, and/or has had seizure(s)).  07/20/13 12/19/25  Renato Shin, MD  glucose blood (TRUE METRIX BLOOD GLUCOSE TEST) test strip Use as instructed 02/15/18   [name], NP  Insulin Glargine (LANTUS) 100 UNIT/ML Solostar Pen Inject 70 Units into the skin at bedtime. 02/15/18   [name], NP  insulin lispro (HUMALOG) 100 UNIT/ML injection Inject 0.15 mLs (15 Units total) into the skin 2 (two) times daily with a meal for 30 days. Hold humalog if unable to eat Patient taking differently: Inject 15 Units into the skin See admin instructions. Inject 15 units into the skin two times a day before meals and HOLD IF UNABLE TO EAT 02/15/18 06/20/18  [name], NP  Insulin Pen Needle (B-D UF III MINI PEN NEEDLES) 31G X 5 MM MISC Use as instructed. Monitor blood glucose levels twice per day 02/15/18   [name], NP  levETIRAcetam (KEPPRA) 500 MG tablet Take 1 tablet (500 mg total) by mouth 2 (two) times daily for 30 days. 06/15/18 07/15/18  Little Ishikawa, MD  pantoprazole (PROTONIX) 40 MG tablet Take 1 tablet (40 mg total) by mouth 2 (two) times daily. 02/01/18   Geradine Girt, DO  promethazine (PHENERGAN) 25 MG tablet Take 1 tablet (25 mg total) by mouth every 6 (six) hours as needed for nausea or vomiting. Patient not taking: Reported on 06/20/2018 04/15/18   Dalia Heading, PA-C  propranolol (INDERAL) 20 MG tablet Take 1  tablet (20 mg total) by mouth 2 (two) times daily. 02/01/18   Geradine Girt, DO  QUEtiapine (SEROQUEL) 100 MG tablet Take 300 mg by mouth at bedtime.     [provider]  traMADol (ULTRAM) 50 MG tablet Take 50 mg by mouth every 6 (six) hours as needed (for pain).    [provider]  TRUEPLUS LANCETS 28G MISC Use as instructed. Monitor blood glucose levels twice per day 12/02/17   [name], NP    ALLERGIES:  Allergies  Allergen Reactions  . Ibuprofen Other (See Comments)    GI MD said to not take this anymore    SOCIAL HISTORY:  Social History   Tobacco Use  . Smoking status: Never Smoker  . Smokeless tobacco: Never Used  Substance Use Topics  . Alcohol use: No    Alcohol/week: 0.0 standard drinks    FAMILY HISTORY: Family History  Problem Relation Age of Onset  . Diabetes Mother   . Hypertension Mother   . Obesity Mother   . Asthma Mother   . Allergic rhinitis Mother   . Eczema Mother   . Diabetes Father   . Hypertension Father   . Obesity Father   . Hyperlipidemia Father   . Hypertension Paternal Aunt   . Hypertension Maternal Grandfather   . Colon cancer Maternal Grandfather   . Diabetes Paternal Grandmother   . Obesity Paternal Grandmother   . Diabetes Paternal Grandfather   . Obesity Paternal Grandfather   . Angioedema Neg Hx   . Immunodeficiency Neg Hx   . Urticaria Neg Hx   . Stomach cancer Neg Hx   . Esophageal cancer Neg Hx     EXAM: BP 120/78 (BP Location: Left Arm)   Pulse (!) 116   Temp 98.6 F (37 C) (Oral)   Resp 17   LMP 06/07/2018 (Exact Date)   SpO2 99%  CONSTITUTIONAL: Alert and oriented x 3 and responds appropriately to questions. Well-appearing; well-nourished, obese, no distress HEAD: Normocephalic, atraumatic EYES: Conjunctivae clear, pupils appear equal, EOMI ENT: normal nose; moist mucous membranes NECK: Supple, no meningismus, no nuchal rigidity, no LAD  CARD: RRR; S1 and S2 appreciated; no murmurs, no  clicks, no rubs, no gallops CHEST:  Chest wall is nontender to palpation.  No crepitus, ecchymosis, erythema, warmth, rash or other lesions present.   RESP: Normal chest excursion without splinting or tachypnea; breath sounds clear and equal bilaterally; no wheezes, no rhonchi, no rales, no hypoxia or respiratory distress, speaking full sentences ABD/GI: Normal bowel sounds; non-distended; soft, diffusely tender throughout the abdomen, no rebound, no guarding, no peritoneal signs, no hepatosplenomegaly BACK:  The back appears normal and is non-tender to palpation, there is no CVA tenderness EXT: Normal ROM in all joints; non-tender to palpation; no edema; normal capillary refill; no cyanosis, no calf tenderness or swelling    SKIN: Normal color for age and race; warm; no rash NEURO: Moves all extremities equally, sensation to light touch intact diffusely, cranial nerves II through XII intact, normal speech PSYCH: The patient's mood and manner are appropriate. Grooming and personal hygiene are appropriate.  MEDICAL DECISION MAKING: Patient here with multiple seizures.  She states she has an outpatient neurologist and states that her seizures are not well controlled.  She reports she is on Keppra 2000 mg twice a day and has been compliant with this medication but states she has been vomiting for the past 3 days with abdominal pain.  This may be the reason of her breakthrough seizures today.  Will give IV Keppra.  She is complaining of chest pain but has history of this and has had negative cardiac CT as well as negative CTA PE studies recently.  Low suspicion for ACS, PE or dissection.  Given she is having abdominal pain and is tender on exam with vomiting, will obtain CT of her abdomen pelvis to rule out appendicitis, colitis, diverticulitis, bowel obstruction.  Will give IV fluids, pain and nausea medicine.  ED PROGRESS: Patient reports she is feeling better.  She is hyperglycemic without DKA.  Will give  IV insulin.  Avoiding IV fluids given her history of CHF.  She has been loaded with IV Keppra here.  EKG shows no ischemic changes.  Chest x-ray clear.  CT of her abdomen pelvis shows mild bladder wall thickening.  Urinalysis pending.  Urine culture has also been sent.  She has been able  to drink here in the emergency department.   Urine shows no sign of infection or ketones.  Blood sugar has improved to 242.  Her heart rate is now down in the 80s.  Her vitals are now normal.  She has not had any further seizure activity.  No vomiting and has been able to tolerate p.o. fluids.  Will discharge with Zofran.  Have recommended PCP follow-up for her continued hyperglycemia and neurology follow-up for her breakthrough seizures.  Do not feel she needs further work-up for her atypical chest pain given she has had extensive work-up previously that has been negative.  I do not think she needs further work-up for her abdominal pain.  Her CT abdomen pelvis shows no acute abnormality.  Discussed with patient that this may have been a viral illness and that vomiting may have contributed to her breakthrough seizures.   At this time, I do not feel there is any life-threatening condition present. I have reviewed and discussed all results (EKG, imaging, lab, urine as appropriate) and exam findings with patient/family. I have reviewed nursing notes and appropriate previous records.  I feel the patient is safe to be discharged home without further emergent workup and can continue workup as an outpatient as needed. Discussed usual and customary return precautions. Patient/family verbalize understanding and are comfortable with this plan.  Outpatient follow-up has been provided as needed. All questions have been answered.     EKG Interpretation  Date/Time:  Monday July 05 2018 00:54:29 EDT Ventricular Rate:  99 PR Interval:    QRS Duration: 87 QT Interval:  333 QTC Calculation: 428 R Axis:   88 Text Interpretation:   Sinus rhythm ST elev, probable normal early repol pattern No significant change since last tracing other than rate is slower Confirmed by , Cyril Mourning [phone]) on 07/05/2018 1:08:44 AM         , Delice Bison, DO 07/05/18 [id number]

## 2018-07-04 NOTE — ED Triage Notes (Signed)
Pt reports abd pain for 4 days and 3 seizures today. Pt taking keppra as prescribed. Pt a.o, nad noted at this time

## 2018-07-05 ENCOUNTER — Emergency Department (HOSPITAL_COMMUNITY): Payer: Medicaid Other

## 2018-07-05 LAB — URINALYSIS, ROUTINE W REFLEX MICROSCOPIC
Bacteria, UA: NONE SEEN
Bilirubin Urine: NEGATIVE
Glucose, UA: >=500 mg/dL — AB
Ketones, ur: NEGATIVE mg/dL
Leukocytes,Ua: NEGATIVE
Nitrite: NEGATIVE
Protein, ur: NEGATIVE mg/dL
Specific Gravity, Urine: 1.035 — ABNORMAL HIGH (ref 1.005–1.030)
pH: 6 (ref 5.0–8.0)

## 2018-07-05 LAB — CBG MONITORING, ED
Glucose-Capillary: 349 mg/dL — ABNORMAL HIGH (ref 70–99)
Glucose-Capillary: 242 mg/dL — ABNORMAL HIGH (ref 70–99)

## 2018-07-05 LAB — RAPID URINE DRUG SCREEN, HOSP PERFORMED
Amphetamines: NOT DETECTED
Barbiturates: NOT DETECTED
Benzodiazepines: NOT DETECTED
Cocaine: NOT DETECTED
Opiates: POSITIVE — AB
Tetrahydrocannabinol: NOT DETECTED

## 2018-07-05 MED ORDER — IOHEXOL 300 MG/ML  SOLN
100.0000 mL | Freq: Once | INTRAMUSCULAR | Status: AC | PRN
  Administered 2018-07-05: 100 mL via INTRAVENOUS

## 2018-07-05 MED ORDER — INSULIN ASPART 100 UNIT/ML ~~LOC~~ SOLN
5.0000 [IU] | Freq: Once | SUBCUTANEOUS | Status: AC
  Administered 2018-07-05: 03:00:00 5 [IU] via INTRAVENOUS

## 2018-07-05 MED ORDER — ONDANSETRON 4 MG PO TBDP: 4.0000 mg | ORAL_TABLET | Freq: Three times a day (TID) | ORAL | 0 refills | Status: DC | PRN

## 2018-07-05 NOTE — ED Notes (Signed)
Pt able to drink some water with no issues.

## 2018-07-05 NOTE — ED Notes (Signed)
Patient transported to CT 

## 2018-07-05 NOTE — ED Notes (Signed)
CBG reading of 349 m/dl reported and read back by RN at 0200.

## 2018-07-06 LAB — URINE CULTURE: Culture: NO GROWTH

## 2018-07-13 ENCOUNTER — Ambulatory Visit: Payer: Medicaid Other | Admitting: Nurse Practitioner

## 2018-07-13 ENCOUNTER — Ambulatory Visit: Payer: Medicaid Other | Attending: Nurse Practitioner | Admitting: Nurse Practitioner

## 2018-07-13 ENCOUNTER — Encounter: Payer: Self-pay | Admitting: Nurse Practitioner

## 2018-07-13 ENCOUNTER — Other Ambulatory Visit: Payer: Self-pay

## 2018-07-13 VITALS — BP 100/69 | HR 138 | Temp 99.5°F | Ht 63.0 in | Wt 184.0 lb

## 2018-07-13 DIAGNOSIS — E118 Type 2 diabetes mellitus with unspecified complications: Secondary | ICD-10-CM | POA: Diagnosis not present

## 2018-07-13 DIAGNOSIS — R51 Headache: Secondary | ICD-10-CM

## 2018-07-13 DIAGNOSIS — G40909 Epilepsy, unspecified, not intractable, without status epilepticus: Secondary | ICD-10-CM

## 2018-07-13 DIAGNOSIS — F419 Anxiety disorder, unspecified: Secondary | ICD-10-CM | POA: Diagnosis not present

## 2018-07-13 DIAGNOSIS — F32A Depression, unspecified: Secondary | ICD-10-CM

## 2018-07-13 DIAGNOSIS — R519 Headache, unspecified: Secondary | ICD-10-CM

## 2018-07-13 DIAGNOSIS — Z794 Long term (current) use of insulin: Secondary | ICD-10-CM | POA: Diagnosis not present

## 2018-07-13 DIAGNOSIS — R000 Tachycardia, unspecified: Secondary | ICD-10-CM

## 2018-07-13 DIAGNOSIS — F329 Major depressive disorder, single episode, unspecified: Secondary | ICD-10-CM

## 2018-07-13 DIAGNOSIS — IMO0001 Reserved for inherently not codable concepts without codable children: Secondary | ICD-10-CM

## 2018-07-13 LAB — POCT GLYCOSYLATED HEMOGLOBIN (HGB A1C): Hemoglobin A1C: 12.9 % — AB (ref 4.0–5.6)

## 2018-07-13 LAB — POCT URINALYSIS DIP (CLINITEK)
Bilirubin, UA: NEGATIVE
Glucose, UA: 500 mg/dL — AB
Leukocytes, UA: NEGATIVE
Nitrite, UA: NEGATIVE
POC PROTEIN,UA: NEGATIVE
Spec Grav, UA: 1.01 (ref 1.010–1.025)
Urobilinogen, UA: 0.2 E.U./dL
pH, UA: 7 (ref 5.0–8.0)

## 2018-07-13 LAB — GLUCOSE, POCT (MANUAL RESULT ENTRY)
POC Glucose: 362 mg/dl — AB (ref 70–99)
POC Glucose: 327 mg/dl — AB (ref 70–99)

## 2018-07-13 MED ORDER — FLUOXETINE HCL 20 MG PO CAPS: 20.0000 mg | ORAL_CAPSULE | Freq: Every day | ORAL | 4 refills | Status: DC

## 2018-07-13 MED ORDER — TOPIRAMATE 25 MG PO TABS: 25.0000 mg | ORAL_TABLET | Freq: Two times a day (BID) | ORAL | 2 refills | Status: DC

## 2018-07-13 MED ORDER — PROPRANOLOL HCL 20 MG PO TABS: 20.0000 mg | ORAL_TABLET | Freq: Two times a day (BID) | ORAL | 2 refills | Status: DC

## 2018-07-13 MED ORDER — INSULIN ASPART 100 UNIT/ML ~~LOC~~ SOLN
20.0000 [IU] | Freq: Once | SUBCUTANEOUS | Status: AC
  Administered 2018-07-13: 20 [IU] via SUBCUTANEOUS

## 2018-07-13 MED ORDER — PROPRANOLOL HCL 40 MG PO TABS: 40.0000 mg | ORAL_TABLET | Freq: Two times a day (BID) | ORAL | 2 refills | Status: DC

## 2018-07-13 MED ORDER — ESCITALOPRAM OXALATE 20 MG PO TABS: 20.0000 mg | ORAL_TABLET | Freq: Every day | ORAL | 1 refills | Status: DC

## 2018-07-13 MED FILL — ESCITALOPRAM 20 MG TABLET: 20 | 30 days supply | Qty: 30 | Fill #0

## 2018-07-13 MED FILL — FLUoxetine HCL 20 MG CAPS: 20 | 30 days supply | Qty: 30 | Fill #0

## 2018-07-13 MED FILL — PROPRANOLOL HCL 40 MG TABS: 40 | 30 days supply | Qty: 60 | Fill #0

## 2018-07-13 MED FILL — TOPIRAMATE 25 MG TABS: 25 | 30 days supply | Qty: 60 | Fill #0

## 2018-07-13 NOTE — Progress Notes (Signed)
Assessment & Plan:  Nancy [name] was seen today for follow-up.  Diagnoses and all orders for this visit:  Insulin dependent diabetes mellitus with complications (HCC) -     Glucose (CBG) -     HgB A1c -     insulin aspart (novoLOG) injection 20 Units -     Glucose (CBG) -     POCT URINALYSIS DIP (CLINITEK) Poorly controlled. She needs to see Endocrinology for evaluation Her DM is very difficult to control and her noncompliance is a factor as well.    Seizure disorder [facility]) -     Ambulatory referral to Neurology She denies any seizure activity since her last ED visit.   Anxiety and depression -     FLUoxetine (PROZAC) 20 MG capsule; Take 1 capsule (20 mg total) by mouth at bedtime. Please allow patient to pick up 30 day supply for free -     escitalopram (LEXAPRO) 20 MG tablet; Take 1 tablet (20 mg total) by mouth daily. Please allow patient to pick up 30 day supply for free  Sinus tachycardia -     propranolol (INDERAL) 20 MG tablet; Take 1 tablet (20 mg total) by mouth 2 (two) times daily. Please allow patient to pick up 30 day supply for free  Chronic nonintractable headache, unspecified headache type -     topiramate (TOPAMAX) 25 MG tablet; Take 1 tablet (25 mg total) by mouth 2 (two) times daily. Please allow patient to pick up 30 day supply for free   Patient has been counseled on age-appropriate routine health concerns for screening and prevention. These are reviewed and up-to-date. Referrals have been placed accordingly. Immunizations are up-to-date or declined.    Subjective:   Chief Complaint  Patient presents with  . Follow-up    Pt. is here to follow up on diabetes.    HPI Nancy [name] 18 y.o. female presents to office today for follow up to DM. She is tachycardic today, has ketones in her urine and glucose is 362. She required 20 units of novolog in the office today. I did request that she be evaluated in the ED due to her tachycardia and ketonuria as well as  history of DKA. She is refusing to go. States she does not want another bill. She is out of several of her medications and will need to have them refilled today.  Lab Results  Component Value Date   HGBA1C 12.9 (A) 07/13/2018    Adjustment Disorder PHQ9 and GAD scores are high. Unfortunately the onsite LCSW is not available for 1 to 1 counseling today. Patient will be scheduled for counseling. She denies any current thoughts of suicide or self harm. She does endorse ruminating thoughts with no specific plan.  Last thoughts of harming herself was a few days ago. I did offer to contact [facility] for possible IP evaluation however patient declines. She has not been taking her prozac or lexapro. Will refill today. She endorses increased stress dealing with lack of finances, unemployment, and having to live with her mother and her mother's girlfriend. States her mother and gf argue and fight in the home often. She can't afford her medications and has to ask her mother for assistance with paying them so therefore she does not have her medications filled often.  Depression screen [facility] 2/9 07/13/2018 06/02/2018 05/25/2018 02/15/2018 12/02/2017  Decreased Interest 3 3 3 3 3   Down, Depressed, Hopeless 3 3 3 3 3   PHQ - 2 Score  6 6 6 6 6   Altered sleeping 3 3 3 3 3   Tired, decreased energy 3 3 3 2 2   Change in appetite 3 1 3 3 3   Feeling bad or failure about yourself  3 3 3 3 3   Trouble concentrating 2 0 1 0 0  Moving slowly or fidgety/restless 3 0 1 2 0  Suicidal thoughts 3 1 1 3 2   PHQ-9 Score 26 17 21 22 19    GAD 7 : Generalized Anxiety Score 07/13/2018 06/02/2018 05/25/2018 02/15/2018  Nervous, Anxious, on Edge 3 1 3 3   Control/stop worrying 3 3 3 3   Worry too much - different things 3 3 3 2   Trouble relaxing 3 1 3 3   Restless 2 1 3 1   Easily annoyed or irritable 3 3 1 2   Afraid - awful might happen 0 0 0 2  Total GAD 7 Score 17 12 16 16     Seizure Disorder She has been seen in the ED  and even admitted to the hospital 3 times since 06-14-2018. She was instructed to follow up with Neurology. Unfortunately she is uninsured. Patient has been advised to apply for financial assistance and schedule to see our financial counselor.  She endorses tonic clonic seizures. Some episodes with incontinence. Currently taking keppra 500 mg BID. EEG unremarkable per review of ED notes.    Review of Systems  Constitutional: Negative for fever, malaise/fatigue and weight loss.  HENT: Negative.  Negative for nosebleeds.   Eyes: Negative.  Negative for blurred vision, double vision and photophobia.  Respiratory: Positive for shortness of breath. Negative for cough.   Cardiovascular: Positive for chest pain. Negative for palpitations and leg swelling.  Gastrointestinal: Negative.  Negative for heartburn, nausea and vomiting.  Musculoskeletal: Negative.  Negative for myalgias.  Neurological: Positive for headaches. Negative for dizziness, focal weakness and seizures.  Psychiatric/Behavioral: Positive for depression. Negative for suicidal ideas. The patient is nervous/anxious and has insomnia.     Past Medical History:  Diagnosis Date  . Acanthosis nigricans   . Anxiety   . CHF (congestive heart failure) ([place])   . Chronic lower back pain   . Depression   . Dyspepsia   . Obesity   . Ovarian cyst    pt is not aware of this hx (11/24/2017)  . Pre-diabetes   . 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 Premature baby   . Seizures ([place])   . Type II diabetes mellitus (HCC)    insulin dependant    Past Surgical History:  Procedure Laterality Date  . ABDOMINAL HERNIA REPAIR     "I was a baby"  . HERNIA REPAIR    . TONSILLECTOMY AND ADENOIDECTOMY    . WISDOM TOOTH EXTRACTION  2017    Family History  Problem Relation Age of Onset  . Diabetes Mother   . Hypertension Mother   . Obesity Mother   . Asthma Mother   . Allergic rhinitis Mother   . Eczema Mother   . Diabetes Father   . Hypertension  Father   . Obesity Father   . Hyperlipidemia Father   . Hypertension Paternal Aunt   . Hypertension Maternal Grandfather   . Colon cancer Maternal Grandfather   . Diabetes Paternal Grandmother   . Obesity Paternal Grandmother   . Diabetes Paternal Grandfather   . Obesity Paternal Grandfather   . Angioedema Neg Hx   . Immunodeficiency Neg Hx   . Urticaria Neg Hx   . Stomach cancer Neg  Hx   . Esophageal cancer Neg Hx     Social History Reviewed with no changes to be made today.   Outpatient Medications Prior to Visit  Medication Sig Dispense Refill  . Blood Glucose Monitoring Suppl (TRUE METRIX METER) w/Device KIT Use as instructed. Monitor blood glucose levels twice per day 1 kit 0  . gabapentin (NEURONTIN) 300 MG capsule Take 1 capsule (300 mg total) by mouth at bedtime for 30 days. 30 capsule 1  . glucagon 1 MG injection Use for Severe Hypoglycemia . Inject 1 mg intramuscularly if unresponsive, unable to swallow, unconscious and/or has seizure (Patient taking differently: Inject 1 mg into the muscle once as needed (for severe hypoglycemia- if unresponsive, unable to swallow, unconscious, and/or has had seizure(s)). ) 2 each 5  . glucose blood (TRUE METRIX BLOOD GLUCOSE TEST) test strip Use as instructed 100 each 12  . Insulin Glargine (LANTUS) 100 UNIT/ML Solostar Pen Inject 70 Units into the skin at bedtime. 15 mL 11  . insulin lispro (HUMALOG) 100 UNIT/ML injection Inject 0.15 mLs (15 Units total) into the skin 2 (two) times daily with a meal for 30 days. Hold humalog if unable to eat (Patient taking differently: Inject 15 Units into the skin See admin instructions. Inject 15 units into the skin two times a day before meals and HOLD IF UNABLE TO EAT) 10 mL prn  . Insulin Pen Needle (B-D UF III MINI PEN NEEDLES) 31G X 5 MM MISC Use as instructed. Monitor blood glucose levels twice per day 90 each 1  . levETIRAcetam (KEPPRA) 500 MG tablet Take 1 tablet (500 mg total) by mouth 2 (two)  times daily for 30 days. 60 tablet 0  . QUEtiapine (SEROQUEL) 100 MG tablet Take 300 mg by mouth at bedtime.     . TRUEPLUS LANCETS 28G MISC Use as instructed. Monitor blood glucose levels twice per day 100 each 3  . escitalopram (LEXAPRO) 10 MG tablet Take 2 tablets (20 mg total) by mouth daily. 30 tablet 3  . FLUoxetine (PROZAC) 20 MG capsule Take 1 capsule (20 mg total) by mouth at bedtime. 30 capsule 4  . traMADol (ULTRAM) 50 MG tablet Take 50 mg by mouth every 6 (six) hours as needed (for pain).    Marland Kitchen diclofenac sodium (VOLTAREN) 1 % GEL Apply 2 g topically 3 (three) times daily as needed (as directed to painful sites).     . ondansetron (ZOFRAN ODT) 4 MG disintegrating tablet Take 1 tablet (4 mg total) by mouth every 8 (eight) hours as needed for nausea or vomiting. (Patient not taking: Reported on 07/13/2018) 20 tablet 0  . pantoprazole (PROTONIX) 40 MG tablet Take 1 tablet (40 mg total) by mouth 2 (two) times daily. (Patient not taking: Reported on 07/13/2018) 60 tablet 0  . promethazine (PHENERGAN) 25 MG tablet Take 1 tablet (25 mg total) by mouth every 6 (six) hours as needed for nausea or vomiting. (Patient not taking: Reported on 06/20/2018) 10 tablet 0  . propranolol (INDERAL) 20 MG tablet Take 1 tablet (20 mg total) by mouth 2 (two) times daily. (Patient not taking: Reported on 07/13/2018) 60 tablet 0   No facility-administered medications prior to visit.     Allergies  Allergen Reactions  . Ibuprofen Other (See Comments)    GI MD said to not take this anymore       Objective:    BP 100/69 (BP Location: Right Arm, Patient Position: Sitting, Cuff Size: Large)   Pulse Marland Kitchen)  138   Temp 99.5 F (37.5 C) (Oral)   Ht 5' 3"  (1.6 m)   Wt 184 lb (83.5 kg)   SpO2 100%   BMI 32.59 kg/m  Wt Readings from Last 3 Encounters:  07/13/18 184 lb (83.5 kg)  06/14/18 188 lb 11.4 oz (85.6 kg) (96 %, Z= 1.75)*  04/14/18 186 lb (84.4 kg) (96 %, Z= 1.71)*   * Growth percentiles are based on CDC  (Girls, 2-20 Years) data.    Physical Exam Vitals signs and nursing note reviewed.  Constitutional:      Appearance: She is well-developed.  HENT:     Head: Normocephalic and atraumatic.     Mouth/Throat:     Dentition: Abnormal dentition. Dental tenderness and dental caries (numerous broken teeth) present. No gingival swelling or dental abscesses.  Neck:     Musculoskeletal: Normal range of motion.  Cardiovascular:     Rate and Rhythm: Regular rhythm. Tachycardia present.     Heart sounds: Normal heart sounds. No murmur. No friction rub. No gallop.   Pulmonary:     Effort: Pulmonary effort is normal. No tachypnea or respiratory distress.     Breath sounds: Normal breath sounds. No decreased breath sounds, wheezing, rhonchi or rales.  Chest:     Chest wall: No tenderness.  Abdominal:     General: Bowel sounds are normal.     Palpations: Abdomen is soft.  Musculoskeletal: Normal range of motion.  Skin:    General: Skin is warm and dry.  Neurological:     Mental Status: She is alert and oriented to person, place, and time.     Coordination: Coordination normal.  Psychiatric:        Attention and Perception: Attention normal.        Mood and Affect: Mood is depressed.        Speech: Speech normal.        Behavior: Behavior normal. Behavior is cooperative.        Thought Content: Thought content normal.        Cognition and Memory: Cognition and memory normal.        Judgment: Judgment normal.        Patient has been counseled extensively about nutrition and exercise as well as the importance of adherence with medications and regular follow-up. The patient was given clear instructions to go to ER or return to medical center if symptoms don't improve, worsen or new problems develop. The patient verbalized understanding.   Follow-up: Return in about 2 weeks (around 07/27/2018) for BP & Heart rate check with Lurena Joiner.  [name], FNP-BC [facility] and  [place], [place]   07/13/2018, 8:58 PM

## 2018-07-14 ENCOUNTER — Encounter: Payer: Self-pay | Admitting: Neurology

## 2018-07-14 MED FILL — PROPRANOLOL 20 MG TABLET: 20 | 30 days supply | Qty: 60 | Fill #0

## 2018-07-16 ENCOUNTER — Telehealth: Payer: Self-pay | Admitting: Licensed Clinical Social Worker

## 2018-07-16 NOTE — Telephone Encounter (Signed)
Call placed to patient to follow up on behavioral health and/or resource needs. LCSW left a message requesting a return call.

## 2018-07-22 ENCOUNTER — Telehealth: Payer: Self-pay | Admitting: Licensed Clinical Social Worker

## 2018-07-22 NOTE — Telephone Encounter (Signed)
Call placed to patient. LCSW introduced self and explained role at [facility]. Pt was informed of consult from PCP to address behavioral health and/or resource needs.   Pt shared that she was diagnosed with depression and anxiety two years ago. She is currently having difficulty managing physical and mental health triggered by chronic health conditions and financial stain. Pt reports hx of SI without plan. She obtained a Financial Counseling application at previous appointment to assist with medical and medication costs.   LCSW informed pt on the correlation between one's physical and mental health, in addition, to how stress can negatively impact health. Coping skills to decrease and/or manage symptoms were discussed. Pt is participating in medication management through PCP. LCSW mailed crisis intervention and behavioral health resources to verified address on file.   Pt was strongly encouraged to contact PCP if symptoms increase or worsen. Pt verbalized understanding and was appreciative for the call.

## 2018-07-25 ENCOUNTER — Other Ambulatory Visit: Payer: Self-pay

## 2018-07-25 ENCOUNTER — Emergency Department (HOSPITAL_COMMUNITY): Payer: Medicaid Other

## 2018-07-25 ENCOUNTER — Encounter (HOSPITAL_COMMUNITY): Payer: Self-pay | Admitting: Emergency Medicine

## 2018-07-25 ENCOUNTER — Observation Stay (HOSPITAL_COMMUNITY)
Admission: EM | Admit: 2018-07-25 | Discharge: 2018-07-26 | Disposition: A | Discharge status: Home / Self Care | Payer: Medicaid Other | Attending: Student | Admitting: Student

## 2018-07-25 DIAGNOSIS — I491 Atrial premature depolarization: Secondary | ICD-10-CM | POA: Insufficient documentation

## 2018-07-25 DIAGNOSIS — R109 Unspecified abdominal pain: Secondary | ICD-10-CM | POA: Diagnosis not present

## 2018-07-25 DIAGNOSIS — G8929 Other chronic pain: Secondary | ICD-10-CM | POA: Diagnosis not present

## 2018-07-25 DIAGNOSIS — R160 Hepatomegaly, not elsewhere classified: Secondary | ICD-10-CM | POA: Diagnosis not present

## 2018-07-25 DIAGNOSIS — F418 Other specified anxiety disorders: Secondary | ICD-10-CM | POA: Diagnosis not present

## 2018-07-25 DIAGNOSIS — R9431 Abnormal electrocardiogram [ECG] [EKG]: Secondary | ICD-10-CM

## 2018-07-25 DIAGNOSIS — R52 Pain, unspecified: Secondary | ICD-10-CM | POA: Diagnosis present

## 2018-07-25 DIAGNOSIS — Z8249 Family history of ischemic heart disease and other diseases of the circulatory system: Secondary | ICD-10-CM | POA: Diagnosis not present

## 2018-07-25 DIAGNOSIS — E1010 Type 1 diabetes mellitus with ketoacidosis without coma: Secondary | ICD-10-CM | POA: Diagnosis not present

## 2018-07-25 DIAGNOSIS — R0789 Other chest pain: Secondary | ICD-10-CM | POA: Insufficient documentation

## 2018-07-25 DIAGNOSIS — Z833 Family history of diabetes mellitus: Secondary | ICD-10-CM | POA: Diagnosis not present

## 2018-07-25 DIAGNOSIS — R569 Unspecified convulsions: Secondary | ICD-10-CM | POA: Diagnosis present

## 2018-07-25 DIAGNOSIS — Z20828 Contact with and (suspected) exposure to other viral communicable diseases: Secondary | ICD-10-CM | POA: Diagnosis not present

## 2018-07-25 DIAGNOSIS — Z79899 Other long term (current) drug therapy: Secondary | ICD-10-CM | POA: Diagnosis not present

## 2018-07-25 DIAGNOSIS — I5022 Chronic systolic (congestive) heart failure: Secondary | ICD-10-CM | POA: Diagnosis not present

## 2018-07-25 DIAGNOSIS — E1110 Type 2 diabetes mellitus with ketoacidosis without coma: Secondary | ICD-10-CM | POA: Diagnosis not present

## 2018-07-25 DIAGNOSIS — R000 Tachycardia, unspecified: Secondary | ICD-10-CM | POA: Insufficient documentation

## 2018-07-25 DIAGNOSIS — R079 Chest pain, unspecified: Secondary | ICD-10-CM | POA: Diagnosis not present

## 2018-07-25 DIAGNOSIS — Z794 Long term (current) use of insulin: Secondary | ICD-10-CM | POA: Insufficient documentation

## 2018-07-25 DIAGNOSIS — F32A Depression, unspecified: Secondary | ICD-10-CM | POA: Diagnosis present

## 2018-07-25 DIAGNOSIS — Z791 Long term (current) use of non-steroidal anti-inflammatories (NSAID): Secondary | ICD-10-CM | POA: Diagnosis not present

## 2018-07-25 DIAGNOSIS — Z23 Encounter for immunization: Secondary | ICD-10-CM | POA: Insufficient documentation

## 2018-07-25 DIAGNOSIS — F329 Major depressive disorder, single episode, unspecified: Secondary | ICD-10-CM | POA: Diagnosis present

## 2018-07-25 DIAGNOSIS — IMO0002 Reserved for concepts with insufficient information to code with codable children: Secondary | ICD-10-CM

## 2018-07-25 DIAGNOSIS — E1165 Type 2 diabetes mellitus with hyperglycemia: Secondary | ICD-10-CM

## 2018-07-25 DIAGNOSIS — R112 Nausea with vomiting, unspecified: Secondary | ICD-10-CM

## 2018-07-25 LAB — BASIC METABOLIC PANEL
Anion gap: 17 — ABNORMAL HIGH (ref 5–15)
Anion gap: 16 — ABNORMAL HIGH (ref 5–15)
Anion gap: 10 (ref 5–15)
Anion gap: 9 (ref 5–15)
BUN: 6 mg/dL (ref 6–20)
BUN: 8 mg/dL (ref 6–20)
BUN: 6 mg/dL (ref 6–20)
BUN: 6 mg/dL (ref 6–20)
CO2: 17 mmol/L — ABNORMAL LOW (ref 22–32)
CO2: 17 mmol/L — ABNORMAL LOW (ref 22–32)
CO2: 22 mmol/L (ref 22–32)
CO2: 22 mmol/L (ref 22–32)
Calcium: 9.2 mg/dL (ref 8.9–10.3)
Calcium: 9 mg/dL (ref 8.9–10.3)
Calcium: 9 mg/dL (ref 8.9–10.3)
Calcium: 8.8 mg/dL — ABNORMAL LOW (ref 8.9–10.3)
Chloride: 101 mmol/L (ref 98–111)
Chloride: 103 mmol/L (ref 98–111)
Chloride: 105 mmol/L (ref 98–111)
Chloride: 105 mmol/L (ref 98–111)
Creatinine, Ser: 0.69 mg/dL (ref 0.44–1.00)
Creatinine, Ser: 0.89 mg/dL (ref 0.44–1.00)
Creatinine, Ser: 0.64 mg/dL (ref 0.44–1.00)
Creatinine, Ser: 0.44 mg/dL (ref 0.44–1.00)
GFR calc Af Amer: >60 mL/min (ref >60)
GFR calc Af Amer: >60 mL/min (ref >60)
GFR calc Af Amer: >60 mL/min (ref >60)
GFR calc Af Amer: >60 mL/min (ref >60)
GFR calc non Af Amer: >60 mL/min (ref >60)
GFR calc non Af Amer: >60 mL/min (ref >60)
GFR calc non Af Amer: >60 mL/min (ref >60)
GFR calc non Af Amer: >60 mL/min (ref >60)
Glucose, Bld: 435 mg/dL — ABNORMAL HIGH (ref 70–99)
Glucose, Bld: 421 mg/dL — ABNORMAL HIGH (ref 70–99)
Glucose, Bld: 125 mg/dL — ABNORMAL HIGH (ref 70–99)
Glucose, Bld: 157 mg/dL — ABNORMAL HIGH (ref 70–99)
Potassium: 4.2 mmol/L (ref 3.5–5.1)
Potassium: 3.7 mmol/L (ref 3.5–5.1)
Potassium: 4 mmol/L (ref 3.5–5.1)
Potassium: 3.3 mmol/L — ABNORMAL LOW (ref 3.5–5.1)
Sodium: 135 mmol/L (ref 135–145)
Sodium: 136 mmol/L (ref 135–145)
Sodium: 137 mmol/L (ref 135–145)
Sodium: 136 mmol/L (ref 135–145)

## 2018-07-25 LAB — CBG MONITORING, ED
Glucose-Capillary: 437 mg/dL — ABNORMAL HIGH (ref 70–99)
Glucose-Capillary: 349 mg/dL — ABNORMAL HIGH (ref 70–99)
Glucose-Capillary: 356 mg/dL — ABNORMAL HIGH (ref 70–99)
Glucose-Capillary: 403 mg/dL — ABNORMAL HIGH (ref 70–99)
Glucose-Capillary: 325 mg/dL — ABNORMAL HIGH (ref 70–99)
Glucose-Capillary: 259 mg/dL — ABNORMAL HIGH (ref 70–99)
Glucose-Capillary: 210 mg/dL — ABNORMAL HIGH (ref 70–99)

## 2018-07-25 LAB — GLUCOSE, CAPILLARY
Glucose-Capillary: 130 mg/dL — ABNORMAL HIGH (ref 70–99)
Glucose-Capillary: 136 mg/dL — ABNORMAL HIGH (ref 70–99)
Glucose-Capillary: 137 mg/dL — ABNORMAL HIGH (ref 70–99)
Glucose-Capillary: 141 mg/dL — ABNORMAL HIGH (ref 70–99)
Glucose-Capillary: 147 mg/dL — ABNORMAL HIGH (ref 70–99)
Glucose-Capillary: 150 mg/dL — ABNORMAL HIGH (ref 70–99)
Glucose-Capillary: 189 mg/dL — ABNORMAL HIGH (ref 70–99)
Glucose-Capillary: 232 mg/dL — ABNORMAL HIGH (ref 70–99)
Glucose-Capillary: 264 mg/dL — ABNORMAL HIGH (ref 70–99)
Glucose-Capillary: 267 mg/dL — ABNORMAL HIGH (ref 70–99)
Glucose-Capillary: 281 mg/dL — ABNORMAL HIGH (ref 70–99)
Glucose-Capillary: 327 mg/dL — ABNORMAL HIGH (ref 70–99)

## 2018-07-25 LAB — TROPONIN I (HIGH SENSITIVITY)
Troponin I (High Sensitivity): <2 ng/L (ref <18)
Troponin I (High Sensitivity): <2 ng/L (ref <18)

## 2018-07-25 LAB — RENAL FUNCTION PANEL
Albumin: 3.8 g/dL (ref 3.5–5.0)
Anion gap: 18 — ABNORMAL HIGH (ref 5–15)
BUN: 7 mg/dL (ref 6–20)
CO2: 15 mmol/L — ABNORMAL LOW (ref 22–32)
Calcium: 9 mg/dL (ref 8.9–10.3)
Chloride: 103 mmol/L (ref 98–111)
Creatinine, Ser: 0.89 mg/dL (ref 0.44–1.00)
GFR calc Af Amer: >60 mL/min (ref >60)
GFR calc non Af Amer: >60 mL/min (ref >60)
Glucose, Bld: 421 mg/dL — ABNORMAL HIGH (ref 70–99)
Phosphorus: 3.9 mg/dL (ref 2.5–4.6)
Potassium: 3.8 mmol/L (ref 3.5–5.1)
Sodium: 136 mmol/L (ref 135–145)

## 2018-07-25 LAB — POCT I-STAT EG7
Acid-base deficit: 3 mmol/L — ABNORMAL HIGH (ref 0.0–2.0)
Bicarbonate: 18.1 mmol/L — ABNORMAL LOW (ref 20.0–28.0)
Calcium, Ion: 1.06 mmol/L — ABNORMAL LOW (ref 1.15–1.40)
HCT: 43 % (ref 36.0–46.0)
Hemoglobin: 14.6 g/dL (ref 12.0–15.0)
O2 Saturation: 76 %
Potassium: 4.1 mmol/L (ref 3.5–5.1)
Sodium: 136 mmol/L (ref 135–145)
TCO2: 19 mmol/L — ABNORMAL LOW (ref 22–32)
pCO2, Ven: 23.7 mmHg — ABNORMAL LOW (ref 44.0–60.0)
pH, Ven: 7.49 — ABNORMAL HIGH (ref 7.250–7.430)
pO2, Ven: 36 mmHg (ref 32.0–45.0)

## 2018-07-25 LAB — CBC
HCT: 40.9 % (ref 36.0–46.0)
Hemoglobin: 13.6 g/dL (ref 12.0–15.0)
MCH: 28 pg (ref 26.0–34.0)
MCHC: 33.3 g/dL (ref 30.0–36.0)
MCV: 84.3 fL (ref 80.0–100.0)
Platelets: 300 10*3/uL (ref 150–400)
RBC: 4.85 MIL/uL (ref 3.87–5.11)
RDW: 12.4 % (ref 11.5–15.5)
WBC: 10.9 10*3/uL — ABNORMAL HIGH (ref 4.0–10.5)
nRBC: 0 % (ref 0.0–0.2)

## 2018-07-25 LAB — URINALYSIS, ROUTINE W REFLEX MICROSCOPIC
Bacteria, UA: NONE SEEN
Bilirubin Urine: NEGATIVE
Glucose, UA: >=500 mg/dL — AB
Hgb urine dipstick: NEGATIVE
Ketones, ur: 80 mg/dL — AB
Leukocytes,Ua: NEGATIVE
Nitrite: NEGATIVE
Protein, ur: NEGATIVE mg/dL
Specific Gravity, Urine: >1.046 — ABNORMAL HIGH (ref 1.005–1.030)
pH: 6 (ref 5.0–8.0)

## 2018-07-25 LAB — HEPATIC FUNCTION PANEL
ALT: 21 U/L (ref 0–44)
AST: 14 U/L — ABNORMAL LOW (ref 15–41)
Albumin: 4.1 g/dL (ref 3.5–5.0)
Alkaline Phosphatase: 85 U/L (ref 38–126)
Bilirubin, Direct: 0.2 mg/dL (ref 0.0–0.2)
Indirect Bilirubin: 1.1 mg/dL — ABNORMAL HIGH (ref 0.3–0.9)
Total Bilirubin: 1.3 mg/dL — ABNORMAL HIGH (ref 0.3–1.2)
Total Protein: 7.1 g/dL (ref 6.5–8.1)

## 2018-07-25 LAB — LIPASE, BLOOD: Lipase: 20 U/L (ref 11–51)

## 2018-07-25 LAB — I-STAT BETA HCG BLOOD, ED (MC, WL, AP ONLY): I-stat hCG, quantitative: <5 m[IU]/mL (ref <5)

## 2018-07-25 LAB — MAGNESIUM: Magnesium: 2.1 mg/dL (ref 1.7–2.4)

## 2018-07-25 LAB — SARS CORONAVIRUS 2 BY RT PCR (HOSPITAL ORDER, PERFORMED IN ~~LOC~~ HOSPITAL LAB): SARS Coronavirus 2: NEGATIVE

## 2018-07-25 MED ORDER — POLYETHYLENE GLYCOL 3350 17 G PO PACK: 17.0000 g | PACK | Freq: Every day | ORAL | Status: DC | PRN

## 2018-07-25 MED ORDER — ACETAMINOPHEN 325 MG PO TABS
650.0000 mg | ORAL_TABLET | Freq: Four times a day (QID) | ORAL | Status: DC | PRN
  Administered 2018-07-26: 650 mg via ORAL
  Filled 2018-07-25: qty 2

## 2018-07-25 MED ORDER — ESCITALOPRAM OXALATE 20 MG PO TABS: 20.0000 mg | ORAL_TABLET | Freq: Every day | ORAL | Status: DC

## 2018-07-25 MED ORDER — INSULIN REGULAR BOLUS VIA INFUSION
0.0000 [IU] | Freq: Three times a day (TID) | INTRAVENOUS | Status: DC
  Filled 2018-07-25: qty 10

## 2018-07-25 MED ORDER — PROPRANOLOL HCL 20 MG PO TABS
20.0000 mg | ORAL_TABLET | Freq: Two times a day (BID) | ORAL | Status: DC
  Administered 2018-07-25 – 2018-07-26 (×3): 20 mg via ORAL
  Filled 2018-07-25 (×4): qty 1

## 2018-07-25 MED ORDER — LEVETIRACETAM IN NACL 1000 MG/100ML IV SOLN
1000.0000 mg | Freq: Once | INTRAVENOUS | Status: AC
  Administered 2018-07-25: 1000 mg via INTRAVENOUS
  Filled 2018-07-25: qty 100

## 2018-07-25 MED ORDER — INSULIN GLARGINE 100 UNIT/ML ~~LOC~~ SOLN
20.0000 [IU] | Freq: Once | SUBCUTANEOUS | Status: AC
  Administered 2018-07-25: 18:00:00 20 [IU] via SUBCUTANEOUS
  Filled 2018-07-25: qty 0.2

## 2018-07-25 MED ORDER — INSULIN GLARGINE 100 UNIT/ML ~~LOC~~ SOLN
20.0000 [IU] | Freq: Two times a day (BID) | SUBCUTANEOUS | Status: DC
  Filled 2018-07-25: qty 0.2

## 2018-07-25 MED ORDER — ONDANSETRON HCL 4 MG PO TABS: 4.0000 mg | ORAL_TABLET | Freq: Four times a day (QID) | ORAL | Status: DC | PRN

## 2018-07-25 MED ORDER — INSULIN REGULAR(HUMAN) IN NACL 100-0.9 UT/100ML-% IV SOLN
INTRAVENOUS | Status: DC
  Administered 2018-07-25: 04:00:00 2.9 [IU]/h via INTRAVENOUS
  Filled 2018-07-25: qty 100

## 2018-07-25 MED ORDER — PANTOPRAZOLE SODIUM 40 MG PO TBEC: 40.0000 mg | DELAYED_RELEASE_TABLET | Freq: Two times a day (BID) | ORAL | Status: DC

## 2018-07-25 MED ORDER — DEXTROSE 50 % IV SOLN: 25.0000 mL | INTRAVENOUS | Status: DC | PRN

## 2018-07-25 MED ORDER — ENOXAPARIN SODIUM 40 MG/0.4ML ~~LOC~~ SOLN
40.0000 mg | SUBCUTANEOUS | Status: DC
  Administered 2018-07-25 – 2018-07-26 (×2): 40 mg via SUBCUTANEOUS
  Filled 2018-07-25 (×3): qty 0.4

## 2018-07-25 MED ORDER — INSULIN REGULAR(HUMAN) IN NACL 100-0.9 UT/100ML-% IV SOLN: INTRAVENOUS | Status: DC

## 2018-07-25 MED ORDER — ONDANSETRON HCL 4 MG/2ML IJ SOLN: 4.0000 mg | Freq: Four times a day (QID) | INTRAMUSCULAR | Status: DC | PRN

## 2018-07-25 MED ORDER — HYDROCODONE-ACETAMINOPHEN 5-325 MG PO TABS
1.0000 | ORAL_TABLET | ORAL | Status: DC | PRN
  Administered 2018-07-25 (×2): 2 via ORAL
  Filled 2018-07-25 (×2): qty 2

## 2018-07-25 MED ORDER — SODIUM CHLORIDE 0.9% FLUSH: 3.0000 mL | Freq: Once | INTRAVENOUS | Status: DC

## 2018-07-25 MED ORDER — IOHEXOL 300 MG/ML  SOLN
100.0000 mL | Freq: Once | INTRAMUSCULAR | Status: AC | PRN
  Administered 2018-07-25: 100 mL via INTRAVENOUS

## 2018-07-25 MED ORDER — PROMETHAZINE HCL 25 MG/ML IJ SOLN
25.0000 mg | Freq: Once | INTRAMUSCULAR | Status: AC
  Administered 2018-07-25: 25 mg via INTRAVENOUS
  Filled 2018-07-25: qty 1

## 2018-07-25 MED ORDER — TOPIRAMATE 25 MG PO TABS
25.0000 mg | ORAL_TABLET | Freq: Two times a day (BID) | ORAL | Status: DC
  Administered 2018-07-25 – 2018-07-26 (×3): 25 mg via ORAL
  Filled 2018-07-25 (×3): qty 1

## 2018-07-25 MED ORDER — GABAPENTIN 300 MG PO CAPS
300.0000 mg | ORAL_CAPSULE | Freq: Every day | ORAL | Status: DC
  Administered 2018-07-25: 23:00:00 300 mg via ORAL
  Filled 2018-07-25: qty 1

## 2018-07-25 MED ORDER — SODIUM CHLORIDE 0.9 % IV BOLUS (SEPSIS)
1000.0000 mL | Freq: Once | INTRAVENOUS | Status: AC
  Administered 2018-07-25: 1000 mL via INTRAVENOUS

## 2018-07-25 MED ORDER — OXYCODONE HCL 5 MG PO TABS
5.0000 mg | ORAL_TABLET | Freq: Four times a day (QID) | ORAL | Status: AC | PRN
  Administered 2018-07-25 – 2018-07-26 (×2): 10 mg via ORAL
  Filled 2018-07-25 (×2): qty 2

## 2018-07-25 MED ORDER — INSULIN ASPART 100 UNIT/ML ~~LOC~~ SOLN
0.0000 [IU] | SUBCUTANEOUS | Status: DC
  Administered 2018-07-25: 7 [IU] via SUBCUTANEOUS
  Administered 2018-07-26: 3 [IU] via SUBCUTANEOUS
  Administered 2018-07-26: 01:00:00 11 [IU] via SUBCUTANEOUS
  Administered 2018-07-26: 7 [IU] via SUBCUTANEOUS
  Administered 2018-07-26: 09:00:00 4 [IU] via SUBCUTANEOUS

## 2018-07-25 MED ORDER — SODIUM CHLORIDE 0.9 % IV SOLN
INTRAVENOUS | Status: DC
  Administered 2018-07-25 (×2): via INTRAVENOUS

## 2018-07-25 MED ORDER — SODIUM CHLORIDE 0.9% FLUSH
3.0000 mL | Freq: Two times a day (BID) | INTRAVENOUS | Status: DC
  Administered 2018-07-26: 3 mL via INTRAVENOUS

## 2018-07-25 MED ORDER — MORPHINE SULFATE (PF) 4 MG/ML IV SOLN
4.0000 mg | Freq: Once | INTRAVENOUS | Status: AC
  Administered 2018-07-25: 03:00:00 4 mg via INTRAVENOUS
  Filled 2018-07-25: qty 1

## 2018-07-25 MED ORDER — FLUOXETINE HCL 20 MG PO CAPS: 20.0000 mg | ORAL_CAPSULE | Freq: Every day | ORAL | Status: DC

## 2018-07-25 MED ORDER — DEXTROSE-NACL 5-0.45 % IV SOLN
INTRAVENOUS | Status: DC
  Administered 2018-07-25: 09:00:00 via INTRAVENOUS

## 2018-07-25 MED ORDER — SODIUM CHLORIDE 0.9 % IV SOLN
INTRAVENOUS | Status: DC
  Administered 2018-07-25: 04:00:00 via INTRAVENOUS

## 2018-07-25 MED ORDER — DEXTROSE-NACL 5-0.45 % IV SOLN: INTRAVENOUS | Status: DC

## 2018-07-25 MED ORDER — ACETAMINOPHEN 650 MG RE SUPP: 650.0000 mg | Freq: Four times a day (QID) | RECTAL | Status: DC | PRN

## 2018-07-25 MED ORDER — LEVETIRACETAM 500 MG PO TABS
1000.0000 mg | ORAL_TABLET | Freq: Two times a day (BID) | ORAL | Status: DC
  Administered 2018-07-25 – 2018-07-26 (×3): 1000 mg via ORAL
  Filled 2018-07-25 (×3): qty 2

## 2018-07-25 MED ORDER — INSULIN GLARGINE 100 UNIT/ML ~~LOC~~ SOLN
20.0000 [IU] | Freq: Two times a day (BID) | SUBCUTANEOUS | Status: DC
  Administered 2018-07-25: 20 [IU] via SUBCUTANEOUS
  Filled 2018-07-25: qty 0.2

## 2018-07-25 MED ORDER — POTASSIUM CHLORIDE 10 MEQ/100ML IV SOLN
10.0000 meq | INTRAVENOUS | Status: AC
  Administered 2018-07-25 (×2): 10 meq via INTRAVENOUS
  Filled 2018-07-25 (×2): qty 100

## 2018-07-25 MED ORDER — LEVETIRACETAM 500 MG PO TABS: 1000.0000 mg | ORAL_TABLET | Freq: Two times a day (BID) | ORAL | Status: DC

## 2018-07-25 MED ORDER — QUETIAPINE FUMARATE 300 MG PO TABS: 300.0000 mg | ORAL_TABLET | Freq: Every day | ORAL | Status: DC

## 2018-07-25 MED ORDER — DICLOFENAC SODIUM 1 % TD GEL
2.0000 g | Freq: Three times a day (TID) | TRANSDERMAL | Status: DC | PRN
  Filled 2018-07-25: qty 100

## 2018-07-25 MED ORDER — PNEUMOCOCCAL VAC POLYVALENT 25 MCG/0.5ML IJ INJ
0.5000 mL | INJECTION | INTRAMUSCULAR | Status: AC
  Administered 2018-07-26: 0.5 mL via INTRAMUSCULAR
  Filled 2018-07-25: qty 0.5

## 2018-07-25 NOTE — ED Triage Notes (Signed)
Per EMS, pt from home, c/o emesis X 1 day and abdominal pain.  Pt also reports three unwitnessed syncopal episodes.  Pt also reports chest pain X a couple days ago. Pt took ASA PTA, was given 2 nitro 188/110 down to 144/80).  Also given 4 mg of zofran.    144/80 Pulse - 118 CBG 440

## 2018-07-25 NOTE — Progress Notes (Signed)
Patient was admitted earlier today, 07/25/2018. Kindly see H&P done by Dr. Christia Reading Opyd.  DKA has resolved No seizure noted since admission Patient is asking for opiate medication for pain. Optimize blood sugar management.  Likely discharge home in the morning.

## 2018-07-25 NOTE — ED Notes (Signed)
ED TO INPATIENT HANDOFF REPORT  ED Nurse Name and Phone #: Seward Grater [phone]  S Name/Age/Gender Nancy [name] 18 y.o. female Room/Bed: RESUSC/RESUSC  Code Status   Code Status: Full Code  Home/SNF/Other Home Patient oriented to: self, place, time and situation Is this baseline? Yes   Triage Complete: Triage complete  Chief Complaint CHF, hypertension  Triage Note Per EMS, pt from home, c/o emesis X 1 day and abdominal pain.  Pt also reports three unwitnessed syncopal episodes.  Pt also reports chest pain X a couple days ago. Pt took ASA PTA, was given 2 nitro 188/110 down to 144/80).  Also given 4 mg of zofran.    144/80 Pulse - 118 CBG 440   Allergies Allergies  Allergen Reactions  . Ibuprofen Other (See Comments)    GI MD said to not take this anymore    Level of Care/Admitting Diagnosis ED Disposition    ED Disposition Condition Comment   Admit  Hospital Area: [facility] [[id number]]  Level of Care: Progressive [102]  I expect the patient will be discharged within 24 hours: Yes  LOW acuity---Tx typically complete <24 hrs---ACUTE conditions typically can be evaluated <24 hours---LABS likely to return to acceptable levels <24 hours---IS near functional baseline---EXPECTED to return to current living arrangement---NOT newly hypoxic: Meets criteria for 5C-Observation unit  Covid Evaluation: Asymptomatic Screening Protocol (No Symptoms)  Diagnosis: DKA (diabetic ketoacidoses) [facility]) [[id number]]  Admitting Physician: Briscoe Deutscher [[id number]]  Attending Physician: Briscoe Deutscher [[id number]]  PT Class (Do Not Modify): Observation [104]  PT Acc Code (Do Not Modify): Observation [10022]       B Medical/Surgery History Past Medical History:  Diagnosis Date  . Acanthosis nigricans   . Anxiety   . CHF (congestive heart failure) (HCC)   . Chronic lower back pain   . Depression   . Dyspepsia   . Obesity   . Ovarian cyst    pt is not aware of this hx  (11/24/2017)  . Pre-diabetes   . 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 Premature baby   . Seizures (HCC)   . Type II diabetes mellitus (HCC)    insulin dependant   Past Surgical History:  Procedure Laterality Date  . ABDOMINAL HERNIA REPAIR     "I was a baby"  . HERNIA REPAIR    . TONSILLECTOMY AND ADENOIDECTOMY    . WISDOM TOOTH EXTRACTION  2017     A IV Location/Drains/Wounds Patient Lines/Drains/Airways Status   Active Line/Drains/Airways    Name:   Placement date:   Placement time:   Site:   Days:   Peripheral IV 07/25/18 Right Antecubital   07/25/18    0247    Antecubital   less than 1   Peripheral IV 07/25/18 Left Antecubital   07/25/18    0350    Antecubital   less than 1          Intake/Output Last 24 hours  Intake/Output Summary (Last 24 hours) at 07/25/2018 0845 Last data filed at 07/25/2018 0865 Gross per 24 hour  Intake 1000 ml  Output -  Net 1000 ml    Labs/Imaging Results for orders placed or performed during the hospital encounter of 07/25/18 (from the past 48 hour(s))  CBG monitoring, ED     Status: Abnormal   Collection Time: 07/25/18  2:39 AM  Result Value Ref Range   Glucose-Capillary 437 (H) 70 - 99 mg/dL  Basic metabolic panel     Status:  Abnormal   Collection Time: 07/25/18  2:40 AM  Result Value Ref Range   Sodium 135 135 - 145 mmol/L   Potassium 4.2 3.5 - 5.1 mmol/L   Chloride 101 98 - 111 mmol/L   CO2 17 (L) 22 - 32 mmol/L   Glucose, Bld 435 (H) 70 - 99 mg/dL   BUN 6 6 - 20 mg/dL   Creatinine, Ser 4.540.69 0.44 - 1.00 mg/dL   Calcium 9.2 8.9 - 09.810.3 mg/dL   GFR calc non Af Amer >60 >60 mL/min   GFR calc Af Amer >60 >60 mL/min   Anion gap 17 (H) 5 - 15    Comment: Performed at [facility], [address] [place], [place] [address]  CBC     Status: Abnormal   Collection Time: 07/25/18  2:40 AM  Result Value Ref Range   WBC 10.9 (H) 4.0 - 10.5 K/uL   RBC 4.85 3.87 - 5.11 MIL/uL   Hemoglobin 13.6 12.0 - 15.0 g/dL   HCT 78.240.9 95.636.0 - 21.346.0  %   MCV 84.3 80.0 - 100.0 fL   MCH 28.0 26.0 - 34.0 pg   MCHC 33.3 30.0 - 36.0 g/dL   RDW 08.612.4 57.811.5 - 46.915.5 %   Platelets 300 150 - 400 K/uL   nRBC 0.0 0.0 - 0.2 %    Comment: Performed at [facility], [address] [place], [place] [address]  Troponin I (High Sensitivity)     Status: None   Collection Time: 07/25/18  2:40 AM  Result Value Ref Range   Troponin I (High Sensitivity) <2 <18 ng/L    Comment: Performed at [facility], [address] [place], [place] [address]  Hepatic function panel     Status: Abnormal   Collection Time: 07/25/18  2:40 AM  Result Value Ref Range   Total Protein 7.1 6.5 - 8.1 g/dL   Albumin 4.1 3.5 - 5.0 g/dL   AST 14 (L) 15 - 41 U/L   ALT 21 0 - 44 U/L   Alkaline Phosphatase 85 38 - 126 U/L   Total Bilirubin 1.3 (H) 0.3 - 1.2 mg/dL   Bilirubin, Direct 0.2 0.0 - 0.2 mg/dL   Indirect Bilirubin 1.1 (H) 0.3 - 0.9 mg/dL    Comment: Performed at [facility], [address] [place], [place] [address]  Lipase, blood     Status: None   Collection Time: 07/25/18  2:40 AM  Result Value Ref Range   Lipase 20 11 - 51 U/L    Comment: Performed at [facility], [address] [place], [place] [address]  I-Stat beta hCG blood, ED     Status: None   Collection Time: 07/25/18  2:49 AM  Result Value Ref Range   I-stat hCG, quantitative <5.0 <5 mIU/mL   Comment 3            Comment:   GEST. AGE      CONC.  (mIU/mL)   <=1 WEEK        5 - 50     2 WEEKS       50 - 500     3 WEEKS       100 - 10,000     4 WEEKS     1,000 - 30,000        FEMALE AND NON-PREGNANT FEMALE:     LESS THAN 5 mIU/mL   POCT I-Stat EG7  Status: Abnormal   Collection Time: 07/25/18  3:08 AM  Result Value Ref Range   pH, Ven 7.490 (H) 7.250 - 7.430   pCO2, Ven 23.7 (L) 44.0 - 60.0 mmHg   pO2, Ven 36.0 32.0 - 45.0 mmHg   Bicarbonate 18.1 (L) 20.0 - 28.0 mmol/L   TCO2 19 (L) 22 - 32 mmol/L   O2 Saturation 76.0 %   Acid-base deficit 3.0 (H) 0.0 - 2.0 mmol/L    Sodium 136 135 - 145 mmol/L   Potassium 4.1 3.5 - 5.1 mmol/L   Calcium, Ion 1.06 (L) 1.15 - 1.40 mmol/L   HCT 43.0 36.0 - 46.0 %   Hemoglobin 14.6 12.0 - 15.0 g/dL   Patient temperature HIDE    Sample type VENOUS    Comment NOTIFIED PHYSICIAN   CBG monitoring, ED     Status: Abnormal   Collection Time: 07/25/18  3:52 AM  Result Value Ref Range   Glucose-Capillary 349 (H) 70 - 99 mg/dL  Troponin I (High Sensitivity)     Status: None   Collection Time: 07/25/18  4:51 AM  Result Value Ref Range   Troponin I (High Sensitivity) <2 <18 ng/L    Comment: Performed at [facility], [address], [place], [place] [address]  CBG monitoring, ED     Status: Abnormal   Collection Time: 07/25/18  5:05 AM  Result Value Ref Range   Glucose-Capillary 356 (H) 70 - 99 mg/dL  Urinalysis, Routine w reflex microscopic     Status: Abnormal   Collection Time: 07/25/18  5:22 AM  Result Value Ref Range   Color, Urine STRAW (A) YELLOW   APPearance CLEAR CLEAR   Specific Gravity, Urine >1.046 (H) 1.005 - 1.030   pH 6.0 5.0 - 8.0   Glucose, UA >=500 (A) NEGATIVE mg/dL   Hgb urine dipstick NEGATIVE NEGATIVE   Bilirubin Urine NEGATIVE NEGATIVE   Ketones, ur 80 (A) NEGATIVE mg/dL   Protein, ur NEGATIVE NEGATIVE mg/dL   Nitrite NEGATIVE NEGATIVE   Leukocytes,Ua NEGATIVE NEGATIVE   RBC / HPF 6-10 0 - 5 RBC/hpf   WBC, UA 0-5 0 - 5 WBC/hpf   Bacteria, UA NONE SEEN NONE SEEN   Squamous Epithelial / LPF 0-5 0 - 5    Comment: Performed at [facility], [address] [place], [place] [address]  CBG monitoring, ED     Status: Abnormal   Collection Time: 07/25/18  5:36 AM  Result Value Ref Range   Glucose-Capillary 403 (H) 70 - 99 mg/dL  Basic metabolic panel     Status: Abnormal   Collection Time: 07/25/18  6:10 AM  Result Value Ref Range   Sodium 136 135 - 145 mmol/L   Potassium 3.7 3.5 - 5.1 mmol/L   Chloride 103 98 - 111 mmol/L   CO2 17 (L) 22 - 32 mmol/L   Glucose, Bld 421 (H) 70 - 99  mg/dL   BUN 8 6 - 20 mg/dL   Creatinine, Ser 8.29 0.44 - 1.00 mg/dL   Calcium 9.0 8.9 - 56.2 mg/dL   GFR calc non Af Amer >60 >60 mL/min   GFR calc Af Amer >60 >60 mL/min   Anion gap 16 (H) 5 - 15    Comment: Performed at [facility], [address] [place], [place] [address]  Renal function panel     Status: Abnormal   Collection Time: 07/25/18  6:10 AM  Result Value Ref Range  Sodium 136 135 - 145 mmol/L   Potassium 3.8 3.5 - 5.1 mmol/L   Chloride 103 98 - 111 mmol/L   CO2 15 (L) 22 - 32 mmol/L   Glucose, Bld 421 (H) 70 - 99 mg/dL   BUN 7 6 - 20 mg/dL   Creatinine, Ser 0.89 0.44 - 1.00 mg/dL   Calcium 9.0 8.9 - 10.3 mg/dL   Phosphorus 3.9 2.5 - 4.6 mg/dL   Albumin 3.8 3.5 - 5.0 g/dL   GFR calc non Af Amer >60 >60 mL/min   GFR calc Af Amer >60 >60 mL/min   Anion gap 18 (H) 5 - 15    Comment: Performed at [facility] [address] [place], [place] [address]  Magnesium     Status: None   Collection Time: 07/25/18  6:10 AM  Result Value Ref Range   Magnesium 2.1 1.7 - 2.4 mg/dL    Comment: Performed at [facility] [address], [place], [place] [address]  CBG monitoring, ED     Status: Abnormal   Collection Time: 07/25/18  6:40 AM  Result Value Ref Range   Glucose-Capillary 325 (H) 70 - 99 mg/dL  CBG monitoring, ED     Status: Abnormal   Collection Time: 07/25/18  7:52 AM  Result Value Ref Range   Glucose-Capillary 259 (H) 70 - 99 mg/dL   Ct Abdomen Pelvis W Contrast  Result Date: 07/25/2018 CLINICAL DATA:  Acute abdominal pain, generalized, with nausea. History of ovarian cyst. EXAM: CT ABDOMEN AND PELVIS WITH CONTRAST TECHNIQUE: Multidetector CT imaging of the abdomen and pelvis was performed using the standard protocol following bolus administration of intravenous contrast. CONTRAST:  14mL OMNIPAQUE IOHEXOL 300 MG/ML  SOLN COMPARISON:  None. FINDINGS: Lower chest: No acute abnormality. Hepatobiliary: No focal liver abnormality is seen. No  gallstones, gallbladder wall thickening, or biliary dilatation. Pancreas: Unremarkable. No pancreatic ductal dilatation or surrounding inflammatory changes. Spleen: Normal in size without focal abnormality. Adrenals/Urinary Tract: Adrenal glands appear normal. Subtle low-density focus within the lower pole of the LEFT kidney, possible edema. No renal stone or hydronephrosis bilaterally. No perinephric fluid. No ureteral or bladder calculi identified. Bladder appears normal. Stomach/Bowel: No dilated large or small bowel loops. No convincing evidence of bowel wall inflammation. Stomach appears normal. Appendix is normal. Vascular/Lymphatic: No significant vascular findings are present. No enlarged abdominal or pelvic lymph nodes. Reproductive: Mild RIGHT adnexal fullness, probable small chronic ovarian cyst. LEFT adnexal regions unremarkable. No free fluid or inflammatory change within either adnexal region Other: No free fluid or abscess collection seen. No free intraperitoneal air. Musculoskeletal: No acute or suspicious osseous finding. IMPRESSION: 1. Subtle low-density focus within the lower pole of the LEFT kidney, possible edema. Recommend correlation with urinalysis for any evidence of pyelonephritis. 2. Mild RIGHT adnexal fullness, probable small chronic ovarian cyst. No free fluid or inflammatory change within either adnexal region. Consider nonemergent pelvic ultrasound at some point to ensure benignity. 3. Remainder of the abdomen and pelvis CT is unremarkable, as detailed above. No bowel obstruction or evidence of bowel wall inflammation. No evidence of acute solid organ abnormality. No renal or ureteral calculi. Appendix is normal. Electronically Signed   By: Franki Cabot M.D.   On: 07/25/2018 04:54   Dg Chest Port 1 View  Result Date: 07/25/2018 CLINICAL DATA:  Pain. Emesis. EXAM: PORTABLE CHEST 1 VIEW COMPARISON:  07/05/2018 FINDINGS: The cardiomediastinal contours are normal. The lungs are clear.  Pulmonary vasculature is normal. No  consolidation, pleural effusion, or pneumothorax. No acute osseous abnormalities are seen. IMPRESSION: No acute chest findings. Electronically Signed   By: Narda RutherfordMelanie  Sanford M.D.   On: 07/25/2018 03:07    Pending Labs Unresulted Labs (From admission, onward)    Start     Ordered   08/01/18 0500  Creatinine, serum  (enoxaparin (LOVENOX)    CrCl >/= 30 ml/min)  Weekly,   R    Comments: while on enoxaparin therapy    07/25/18 0540   07/25/18 0553  SARS Coronavirus 2 (CEPHEID - Performed in [facility] hospital lab), Hosp Order  (Asymptomatic Patients Labs)  Once,   STAT    Question:  Rule Out  Answer:  Yes   07/25/18 0552   07/25/18 0538  Basic metabolic panel  STAT Now then every 4 hours ,   STAT     07/25/18 0538   07/25/18 0537  Levetiracetam level  Add-on,   AD     07/25/18 0536   07/25/18 0501  Urine culture  ONCE - STAT,   STAT     07/25/18 0500          Vitals/Pain Today's Vitals   07/25/18 0600 07/25/18 0743 07/25/18 0743 07/25/18 0800  BP:   (!) 114/58 (!) 128/98  Pulse:   (!) 120 (!) 115  Resp:   (!) 23 13  Temp:      TempSrc:      SpO2:   100% 100%  PainSc: Asleep 10-Worst pain ever      Isolation Precautions No active isolations  Medications Medications  sodium chloride flush (NS) 0.9 % injection 3 mL (3 mLs Intravenous Not Given 07/25/18 0755)  dextrose 5 %-0.45 % sodium chloride infusion ( Intravenous Not Given 07/25/18 0754)  insulin regular bolus via infusion 0-10 Units (0 Units Intravenous Not Given 07/25/18 0755)  insulin regular, human (MYXREDLIN) 100 units/ 100 mL infusion (6 Units/hr Intravenous Rate/Dose Change 07/25/18 0753)  dextrose 50 % solution 25 mL (has no administration in time range)  0.9 %  sodium chloride infusion ( Intravenous New Bag/Given 07/25/18 0755)  dextrose 5 %-0.45 % sodium chloride infusion ( Intravenous Not Given 07/25/18 0754)  potassium chloride 10 mEq in 100 mL IVPB (10 mEq Intravenous New  Bag/Given 07/25/18 0807)  enoxaparin (LOVENOX) injection 40 mg (has no administration in time range)  sodium chloride flush (NS) 0.9 % injection 3 mL (has no administration in time range)  acetaminophen (TYLENOL) tablet 650 mg (has no administration in time range)    Or  acetaminophen (TYLENOL) suppository 650 mg (has no administration in time range)  HYDROcodone-acetaminophen (NORCO/VICODIN) 5-325 MG per tablet 1-2 tablet (2 tablets Oral Given 07/25/18 0645)  polyethylene glycol (MIRALAX / GLYCOLAX) packet 17 g (has no administration in time range)  propranolol (INDERAL) tablet 20 mg (has no administration in time range)  escitalopram (LEXAPRO) tablet 20 mg (has no administration in time range)  QUEtiapine (SEROQUEL) tablet 300 mg (has no administration in time range)  gabapentin (NEURONTIN) capsule 300 mg (has no administration in time range)  levETIRAcetam (KEPPRA) tablet 1,000 mg (has no administration in time range)  topiramate (TOPAMAX) tablet 25 mg (has no administration in time range)  diclofenac sodium (VOLTAREN) 1 % transdermal gel 2 g (has no administration in time range)  sodium chloride 0.9 % bolus 1,000 mL (0 mLs Intravenous Stopped 07/25/18 0646)  promethazine (PHENERGAN) injection 25 mg (25 mg Intravenous Given 07/25/18 0318)  morphine 4 MG/ML injection 4 mg (4 mg  Intravenous Given 07/25/18 0322)  levETIRAcetam (KEPPRA) IVPB 1000 mg/100 mL premix (0 mg Intravenous Stopped 07/25/18 0353)  iohexol (OMNIPAQUE) 300 MG/ML solution 100 mL (100 mLs Intravenous Contrast Given 07/25/18 0411)    Mobility walks Low fall risk   Focused Assessments   R Recommendations: See Admitting Provider Note  Report given to:   Additional Notes: glucostablizer

## 2018-07-25 NOTE — ED Provider Notes (Signed)
TIME SEEN: 3:05 AM  CHIEF COMPLAINT: Multiple complaints  HPI: Patient is a 18 year old female with history of CHF, insulin-dependent type 2 diabetes, seizures, morbid obesity who presents to the emergency department with multiple complaints.  Patient complains of episodes of "passing out" that have been ongoing for weeks.  It appears she has had work-up for this and is thought to have undiagnosed seizure disorder.  She has had normal brain MRI in November 2019.  She has had negative EEG.  It is unclear if patient has followed up with neurology as recommended during her last admission in June 2020.  Patient also reports that she has had 3 days of chest pain that feels like a tightness worse with deep inspiration.  She states she feels short of breath.  No fevers or cough.  She has a history of chronic chest pain.  She does have a history of CHF.  No peripheral edema on exam.  It does not appear that she is on a diuretic.  Patient also complaining of diffuse abdominal pain that is crampy in nature with nausea and vomiting.  Her blood sugars in the 400s today.  She states she has had DKA today and this feels somewhat similar.  No fevers, diarrhea, dysuria, hematuria, vaginal bleeding or discharge.  No previous pregnancies.  Echo 12/2017:  Study Conclusions  - Left ventricle: The cavity size was normal. Wall thickness was   normal. Systolic function was mildly reduced. The estimated   ejection fraction was in the range of 45% to 50%. Diffuse   hypokinesis. Left ventricular diastolic function parameters were   normal.  Impressions:  - Mild global reduction in LV systolic function.   ROS: See HPI Constitutional: no fever  Eyes: no drainage  ENT: no runny nose   Cardiovascular:   chest pain  Resp:  SOB  GI:  vomiting GU: no dysuria Integumentary: no rash  Allergy: no hives  Musculoskeletal: no leg swelling  Neurological: no slurred speech ROS otherwise negative  PAST MEDICAL  HISTORY/PAST SURGICAL HISTORY:  Past Medical History:  Diagnosis Date  . Acanthosis nigricans   . Anxiety   . CHF (congestive heart failure) ([place])   . Chronic lower back pain   . Depression   . Dyspepsia   . Obesity   . Ovarian cyst    pt is not aware of this hx (11/24/2017)  . Pre-diabetes   . 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 Premature baby   . Seizures ([place])   . Type II diabetes mellitus ([facility])    insulin dependant    MEDICATIONS:  Prior to Admission medications   Medication Sig Start Date End Date Taking? Authorizing Provider  Blood Glucose Monitoring Suppl (TRUE METRIX METER) w/Device KIT Use as instructed. Monitor blood glucose levels twice per day 12/02/17   [name], NP  diclofenac sodium (VOLTAREN) 1 % GEL Apply 2 g topically 3 (three) times daily as needed (as directed to painful sites).     [provider]  escitalopram (LEXAPRO) 20 MG tablet Take 1 tablet (20 mg total) by mouth daily. Please allow patient to pick up 30 day supply for free 07/13/18 10/11/18  [name], NP  FLUoxetine (PROZAC) 20 MG capsule Take 1 capsule (20 mg total) by mouth at bedtime. Please allow patient to pick up 30 day supply for free 07/13/18 08/12/18  [name], NP  gabapentin (NEURONTIN) 300 MG capsule Take 1 capsule (300 mg total) by mouth at bedtime for  30 days. 06/02/18 07/05/27  [name], NP  glucagon 1 MG injection Use for Severe Hypoglycemia . Inject 1 mg intramuscularly if unresponsive, unable to swallow, unconscious and/or has seizure Patient taking differently: Inject 1 mg into the muscle once as needed (for severe hypoglycemia- if unresponsive, unable to swallow, unconscious, and/or has had seizure(s)).  07/20/13 12/19/25  Renato Shin, MD  glucose blood (TRUE METRIX BLOOD GLUCOSE TEST) test strip Use as instructed 02/15/18   [name], NP  Insulin Glargine (LANTUS) 100 UNIT/ML Solostar Pen Inject 70 Units into the skin at bedtime. 02/15/18   [name], NP  insulin lispro (HUMALOG) 100 UNIT/ML injection Inject 0.15 mLs (15 Units total) into the skin 2 (two) times daily with a meal for 30 days. Hold humalog if unable to eat Patient taking differently: Inject 15 Units into the skin See admin instructions. Inject 15 units into the skin two times a day before meals and HOLD IF UNABLE TO EAT 02/15/18 07/05/27  [name], NP  Insulin Pen Needle (B-D UF III MINI PEN NEEDLES) 31G X 5 MM MISC Use as instructed. Monitor blood glucose levels twice per day 02/15/18   [name], NP  levETIRAcetam (KEPPRA) 500 MG tablet Take 1 tablet (500 mg total) by mouth 2 (two) times daily for 30 days. 06/15/18 07/15/18  Little Ishikawa, MD  ondansetron (ZOFRAN ODT) 4 MG disintegrating tablet Take 1 tablet (4 mg total) by mouth every 8 (eight) hours as needed for nausea or vomiting. Patient not taking: Reported on 07/13/2018 07/05/18   Tomeshia Pizzi, Delice Bison, DO  pantoprazole (PROTONIX) 40 MG tablet Take 1 tablet (40 mg total) by mouth 2 (two) times daily. Patient not taking: Reported on 07/13/2018 02/01/18   Geradine Girt, DO  promethazine (PHENERGAN) 25 MG tablet Take 1 tablet (25 mg total) by mouth every 6 (six) hours as needed for nausea or vomiting. Patient not taking: Reported on 06/20/2018 04/15/18   Dalia Heading, PA-C  propranolol (INDERAL) 20 MG tablet Take 1 tablet (20 mg total) by mouth 2 (two) times daily. Please allow patient to pick up 30 day supply for free 07/13/18 08/12/18  [name], NP  QUEtiapine (SEROQUEL) 100 MG tablet Take 300 mg by mouth at bedtime.     [provider]  topiramate (TOPAMAX) 25 MG tablet Take 1 tablet (25 mg total) by mouth 2 (two) times daily. Please allow patient to pick up 30 day supply for free 07/13/18 08/12/18  [name], NP  TRUEPLUS LANCETS 28G MISC Use as instructed. Monitor blood glucose levels twice per day 12/02/17   [name], NP    ALLERGIES:  Allergies  Allergen Reactions  .  Ibuprofen Other (See Comments)    GI MD said to not take this anymore    SOCIAL HISTORY:  Social History   Tobacco Use  . Smoking status: Never Smoker  . Smokeless tobacco: Never Used  Substance Use Topics  . Alcohol use: No    Alcohol/week: 0.0 standard drinks    FAMILY HISTORY: Family History  Problem Relation Age of Onset  . Diabetes Mother   . Hypertension Mother   . Obesity Mother   . Asthma Mother   . Allergic rhinitis Mother   . Eczema Mother   . Diabetes Father   . Hypertension Father   . Obesity Father   . Hyperlipidemia Father   . Hypertension Paternal Aunt   . Hypertension Maternal Grandfather   .  Colon cancer Maternal Grandfather   . Diabetes Paternal Grandmother   . Obesity Paternal Grandmother   . Diabetes Paternal Grandfather   . Obesity Paternal Grandfather   . Angioedema Neg Hx   . Immunodeficiency Neg Hx   . Urticaria Neg Hx   . Stomach cancer Neg Hx   . Esophageal cancer Neg Hx     EXAM: BP (!) 141/91   Pulse (!) 137   Temp 98.3 F (36.8 C) (Oral)   Resp (!) 28   SpO2 100%  CONSTITUTIONAL: Alert and oriented and responds appropriately to questions.  Morbidly obese.  Appears uncomfortable.  Actively vomiting. HEAD: Normocephalic EYES: Conjunctivae clear, pupils appear equal, EOMI ENT: normal nose; moist mucous membranes NECK: Supple, no meningismus, no nuchal rigidity, no LAD  CARD: Regular and tachycardic; S1 and S2 appreciated; no murmurs, no clicks, no rubs, no gallops RESP: Normal chest excursion without splinting or tachypnea; breath sounds clear and equal bilaterally; no wheezes, no rhonchi, no rales, no hypoxia or respiratory distress, speaking full sentences ABD/GI: Normal bowel sounds; non-distended; soft, diffusely tender throughout the abdomen without guarding or rebound BACK:  The back appears normal and is non-tender to palpation, there is no CVA tenderness EXT: Normal ROM in all joints; non-tender to palpation; no edema; normal  capillary refill; no cyanosis, no calf tenderness or swelling    SKIN: Normal color for age and race; warm; no rash NEURO: Moves all extremities equally PSYCH: The patient's mood and manner are appropriate. Grooming and personal hygiene are appropriate.  MEDICAL DECISION MAKING: Patient here with multiple complaints.  She complains of syncopal event versus seizures which has been ongoing.  She is on Keppra 500 mg twice daily.  States she has not been able to keep her medication down today.  Will give a dose of IV Keppra here in the emergency department.  I do not feel this is a primary reason why she is here in the ED today.  Also complains of chest pain which seems very atypical.  Does have a history of CHF but appears dry rather than volume overloaded.  Low suspicion for ACS.  PE is on the differential the patient does have history of chronic chest pain.  EKG shows no ischemic change.  Patient also complaining of vomiting abdominal pain.  She is hyperglycemic here.  I suspect that she is in DKA but will obtain CT imaging of her abdomen to rule out any surgical pathology.  ED PROGRESS: Patient's pH is 7.49 with a PCO2 of 23.7 and a bicarb of 18.  Potassium normal at 4.1.  Will start insulin drip.  Patient CT scan shows subtle low-density focus within the lower pole of the left kidney that could be possible edema however urine does not appear infected.  Her urine does show large ketones consistent with her DKA.  She does have a right ovarian cyst but no significant pelvic pain on exam.  Discussed these findings with patient and have recommended admission which she agrees to.  5:35 AM Discussed patient's case with hospitalist, Dr. Myna Hidalgo.  I have recommended admission and patient (and family if present) agree with this plan. Admitting physician will place admission orders.   I reviewed all nursing notes, vitals, pertinent previous records, EKGs, lab and urine results, imaging (as  available).   CRITICAL CARE Performed by: Pryor Curia   Total critical care time: 45 minutes  Critical care time was exclusive of separately billable procedures and treating other patients.  Critical care  was necessary to treat or prevent imminent or life-threatening deterioration.  Critical care was time spent personally by me on the following activities: development of treatment plan with patient and/or surrogate as well as nursing, discussions with consultants, evaluation of patient's response to treatment, examination of patient, obtaining history from patient or surrogate, ordering and performing treatments and interventions, ordering and review of laboratory studies, ordering and review of radiographic studies, pulse oximetry and re-evaluation of patient's condition.      Karmello Abercrombie, Delice Bison, DO 07/25/18 [phone]

## 2018-07-25 NOTE — ED Notes (Signed)
Pt drinking "sugar free cool aide"

## 2018-07-25 NOTE — H&P (Signed)
History and Physical    Nancy [name] HYQ:[id number] DOB: 2000-06-30 DOA: 07/25/2018  PCP: [name], NP   Patient coming from: Home   Chief Complaint: Abd pain, N/V, seizures, chest pain,   HPI: Nancy [name] is a 18 y.o. female with medical history significant for insulin-dependent diabetes mellitus, seizures, CHF, chronic pain, and depression with anxiety, now presenting to the emergency department for evaluation of multiple complaints, primarily abdominal pain with nausea and vomiting, but she also reports some recent chest pain, and 3 seizures since her last visit.  Patient reports that the chest and abdominal pain have been going on for years, has been little worse over the past 3 days, and then she developed worsening abdominal pain overnight that was followed by nausea and recurrent episodes of nonbloody vomiting.  She reports that she took her Keppra in the morning and had not vomited for several hours after that.  She reports recent adherence with her medications.  She has not been coughing and does not believe that she has had a fever.  She has been seen in the emergency department couple times since her recent admission for similar complaints, had her Keppra increased to 1 g twice daily.  ED Course: Upon arrival to the ED, patient is found to be afebrile, saturating well on room air, tachycardic, and with stable blood pressure.  EKG features sinus tachycardia with rate 108, PAC, nonspecific ST abnormality, and QTc interval of 533 ms.  Chemistry panel is notable for glucose of 135 with bicarbonate of 17 and anion gap of 17.  CBC features a mild leukocytosis.  Troponin is undetectably low.  Pregnancy test negative.  Urine notable for glucosuria and ketonuria.  CT of the abdomen and pelvis was obtained and notable for subtle low-density focus within the lower pole the right kidney that could potentially reflect pyelonephritis, as well as mild right-sided adnexal fullness which is  probably a small chronic cyst.  Chest x-ray is negative for acute cardiopulmonary disease.  Patient was given a liter of normal saline, morphine, Reglan, Phenergan, and 1 g of IV Keppra in the ED.  Hospitalists are consulted for admission.  Review of Systems:  All other systems reviewed and apart from HPI, are negative.  Past Medical History:  Diagnosis Date  . Acanthosis nigricans   . Anxiety   . CHF (congestive heart failure) ([place])   . Chronic lower back pain   . Depression   . Dyspepsia   . Obesity   . Ovarian cyst    pt is not aware of this hx (11/24/2017)  . Pre-diabetes   . 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 Premature baby   . Seizures ([place])   . Type II diabetes mellitus ([facility])    insulin dependant    Past Surgical History:  Procedure Laterality Date  . ABDOMINAL HERNIA REPAIR     "I was a baby"  . HERNIA REPAIR    . TONSILLECTOMY AND ADENOIDECTOMY    . WISDOM TOOTH EXTRACTION  2017     reports that she has never smoked. She has never used smokeless tobacco. She reports that she does not drink alcohol or use drugs.  Allergies  Allergen Reactions  . Ibuprofen Other (See Comments)    GI MD said to not take this anymore    Family History  Problem Relation Age of Onset  . Diabetes Mother   . Hypertension Mother   . Obesity Mother   . Asthma Mother   .  Allergic rhinitis Mother   . Eczema Mother   . Diabetes Father   . Hypertension Father   . Obesity Father   . Hyperlipidemia Father   . Hypertension Paternal Aunt   . Hypertension Maternal Grandfather   . Colon cancer Maternal Grandfather   . Diabetes Paternal Grandmother   . Obesity Paternal Grandmother   . Diabetes Paternal Grandfather   . Obesity Paternal Grandfather   . Angioedema Neg Hx   . Immunodeficiency Neg Hx   . Urticaria Neg Hx   . Stomach cancer Neg Hx   . Esophageal cancer Neg Hx      Prior to Admission medications   Medication Sig Start Date End Date Taking? Authorizing Provider  Blood  Glucose Monitoring Suppl (TRUE METRIX METER) w/Device KIT Use as instructed. Monitor blood glucose levels twice per day 12/02/17   [name], NP  diclofenac sodium (VOLTAREN) 1 % GEL Apply 2 g topically 3 (three) times daily as needed (as directed to painful sites).     [provider]  escitalopram (LEXAPRO) 20 MG tablet Take 1 tablet (20 mg total) by mouth daily. Please allow patient to pick up 30 day supply for free 07/13/18 10/11/18  [name], NP  FLUoxetine (PROZAC) 20 MG capsule Take 1 capsule (20 mg total) by mouth at bedtime. Please allow patient to pick up 30 day supply for free 07/13/18 08/12/18  [name], NP  gabapentin (NEURONTIN) 300 MG capsule Take 1 capsule (300 mg total) by mouth at bedtime for 30 days. 06/02/18 07/05/27  [name], NP  glucagon 1 MG injection Use for Severe Hypoglycemia . Inject 1 mg intramuscularly if unresponsive, unable to swallow, unconscious and/or has seizure Patient taking differently: Inject 1 mg into the muscle once as needed (for severe hypoglycemia- if unresponsive, unable to swallow, unconscious, and/or has had seizure(s)).  07/20/13 12/19/25  Renato Shin, MD  glucose blood (TRUE METRIX BLOOD GLUCOSE TEST) test strip Use as instructed 02/15/18   [name], NP  Insulin Glargine (LANTUS) 100 UNIT/ML Solostar Pen Inject 70 Units into the skin at bedtime. 02/15/18   [name], NP  insulin lispro (HUMALOG) 100 UNIT/ML injection Inject 0.15 mLs (15 Units total) into the skin 2 (two) times daily with a meal for 30 days. Hold humalog if unable to eat Patient taking differently: Inject 15 Units into the skin See admin instructions. Inject 15 units into the skin two times a day before meals and HOLD IF UNABLE TO EAT 02/15/18 07/05/27  [name], NP  Insulin Pen Needle (B-D UF III MINI PEN NEEDLES) 31G X 5 MM MISC Use as instructed. Monitor blood glucose levels twice per day 02/15/18   [name], NP  levETIRAcetam  (KEPPRA) 500 MG tablet Take 1 tablet (500 mg total) by mouth 2 (two) times daily for 30 days. 06/15/18 07/15/18  Little Ishikawa, MD  ondansetron (ZOFRAN ODT) 4 MG disintegrating tablet Take 1 tablet (4 mg total) by mouth every 8 (eight) hours as needed for nausea or vomiting. Patient not taking: Reported on 07/13/2018 07/05/18   Ward, Delice Bison, DO  pantoprazole (PROTONIX) 40 MG tablet Take 1 tablet (40 mg total) by mouth 2 (two) times daily. Patient not taking: Reported on 07/13/2018 02/01/18   Geradine Girt, DO  promethazine (PHENERGAN) 25 MG tablet Take 1 tablet (25 mg total) by mouth every 6 (six) hours as needed for nausea or vomiting. Patient not taking: Reported on  06/20/2018 04/15/18   Lawyer, Harrell Gave, PA-C  propranolol (INDERAL) 20 MG tablet Take 1 tablet (20 mg total) by mouth 2 (two) times daily. Please allow patient to pick up 30 day supply for free 07/13/18 08/12/18  [name], NP  QUEtiapine (SEROQUEL) 100 MG tablet Take 300 mg by mouth at bedtime.     [provider]  topiramate (TOPAMAX) 25 MG tablet Take 1 tablet (25 mg total) by mouth 2 (two) times daily. Please allow patient to pick up 30 day supply for free 07/13/18 08/12/18  [name], NP  TRUEPLUS LANCETS 28G MISC Use as instructed. Monitor blood glucose levels twice per day 12/02/17   [name], NP    Physical Exam: Vitals:   07/25/18 0222 07/25/18 0243 07/25/18 0300 07/25/18 0330  BP: 139/79 (!) 141/91 (!) 155/98 (!) 141/82  Pulse: (!) 137  (!) 114   Resp: 20 (!) 28 (!) 21 (!) 8  Temp: 98.3 F (36.8 C)     TempSrc: Oral     SpO2: 100%  100%     Constitutional: NAD, sleeping, easily woken  Eyes: PERTLA, lids and conjunctivae normal ENMT: Mucous membranes are moist. Posterior pharynx clear of any exudate or lesions.   Neck: normal, supple, no masses, no thyromegaly Respiratory: no wheezing, no crackles. Normal respiratory effort. No accessory muscle use.  Cardiovascular: Rate ~110 and regular.  No extremity edema. 2+ pedal pulses. No carotid bruits. No significant JVD. Abdomen: No distension, soft, generalized tenderness. Bowel sounds active.  Musculoskeletal: no clubbing / cyanosis. No joint deformity upper and lower extremities.   Skin: no significant rashes, lesions, ulcers. Warm, dry, well-perfused. Neurologic: CN 2-12 grossly intact. Sensation intact. Strength 5/5 in all 4 limbs.  Psychiatric: Alert and oriented x 3. Calm, cooperative.    Labs on Admission: I have personally reviewed following labs and imaging studies  CBC: Recent Labs  Lab 07/25/18 0240 07/25/18 0308  WBC 10.9*  --   HGB 13.6 14.6  HCT 40.9 43.0  MCV 84.3  --   PLT 300  --    Basic Metabolic Panel: Recent Labs  Lab 07/25/18 0240 07/25/18 0308  NA 135 136  K 4.2 4.1  CL 101  --   CO2 17*  --   GLUCOSE 435*  --   BUN 6  --   CREATININE 0.69  --   CALCIUM 9.2  --    GFR: Estimated Creatinine Clearance: 114.8 mL/min (by C-G formula based on SCr of 0.69 mg/dL). Liver Function Tests: Recent Labs  Lab 07/25/18 0240  AST 14*  ALT 21  ALKPHOS 85  BILITOT 1.3*  PROT 7.1  ALBUMIN 4.1   Recent Labs  Lab 07/25/18 0240  LIPASE 20   No results for input(s): AMMONIA in the last 168 hours. Coagulation Profile: No results for input(s): INR, PROTIME in the last 168 hours. Cardiac Enzymes: No results for input(s): CKTOTAL, CKMB, CKMBINDEX, TROPONINI in the last 168 hours. BNP (last 3 results) No results for input(s): PROBNP in the last 8760 hours. HbA1C: No results for input(s): HGBA1C in the last 72 hours. CBG: Recent Labs  Lab 07/25/18 0239 07/25/18 0352 07/25/18 0505 07/25/18 0536  GLUCAP 437* 349* 356* 403*   Lipid Profile: No results for input(s): CHOL, HDL, LDLCALC, TRIG, CHOLHDL, LDLDIRECT in the last 72 hours. Thyroid Function Tests: No results for input(s): TSH, T4TOTAL, FREET4, T3FREE, THYROIDAB in the last 72 hours. Anemia Panel: No results for input(s): VITAMINB12,  FOLATE, FERRITIN,  TIBC, IRON, RETICCTPCT in the last 72 hours. Urine analysis:    Component Value Date/Time   COLORURINE STRAW (A) 07/25/2018 0522   APPEARANCEUR CLEAR 07/25/2018 0522   LABSPEC >1.046 (H) 07/25/2018 0522   PHURINE 6.0 07/25/2018 0522   GLUCOSEU >=500 (A) 07/25/2018 0522   HGBUR NEGATIVE 07/25/2018 0522   BILIRUBINUR NEGATIVE 07/25/2018 0522   BILIRUBINUR negative 07/13/2018 1651   KETONESUR 80 (A) 07/25/2018 0522   PROTEINUR NEGATIVE 07/25/2018 0522   UROBILINOGEN 0.2 07/13/2018 1651   UROBILINOGEN 1.0 07/09/2017 1324   NITRITE NEGATIVE 07/25/2018 0522   LEUKOCYTESUR NEGATIVE 07/25/2018 0522   Sepsis Labs: _0 (procalcitonin:4,lacticidven:4) )No results found for this or any previous visit (from the past 240 hour(s)).   Radiological Exams on Admission: Ct Abdomen Pelvis W Contrast  Result Date: 07/25/2018 CLINICAL DATA:  Acute abdominal pain, generalized, with nausea. History of ovarian cyst. EXAM: CT ABDOMEN AND PELVIS WITH CONTRAST TECHNIQUE: Multidetector CT imaging of the abdomen and pelvis was performed using the standard protocol following bolus administration of intravenous contrast. CONTRAST:  169m OMNIPAQUE IOHEXOL 300 MG/ML  SOLN COMPARISON:  None. FINDINGS: Lower chest: No acute abnormality. Hepatobiliary: No focal liver abnormality is seen. No gallstones, gallbladder wall thickening, or biliary dilatation. Pancreas: Unremarkable. No pancreatic ductal dilatation or surrounding inflammatory changes. Spleen: Normal in size without focal abnormality. Adrenals/Urinary Tract: Adrenal glands appear normal. Subtle low-density focus within the lower pole of the LEFT kidney, possible edema. No renal stone or hydronephrosis bilaterally. No perinephric fluid. No ureteral or bladder calculi identified. Bladder appears normal. Stomach/Bowel: No dilated large or small bowel loops. No convincing evidence of bowel wall inflammation. Stomach appears normal. Appendix is  normal. Vascular/Lymphatic: No significant vascular findings are present. No enlarged abdominal or pelvic lymph nodes. Reproductive: Mild RIGHT adnexal fullness, probable small chronic ovarian cyst. LEFT adnexal regions unremarkable. No free fluid or inflammatory change within either adnexal region Other: No free fluid or abscess collection seen. No free intraperitoneal air. Musculoskeletal: No acute or suspicious osseous finding. IMPRESSION: 1. Subtle low-density focus within the lower pole of the LEFT kidney, possible edema. Recommend correlation with urinalysis for any evidence of pyelonephritis. 2. Mild RIGHT adnexal fullness, probable small chronic ovarian cyst. No free fluid or inflammatory change within either adnexal region. Consider nonemergent pelvic ultrasound at some point to ensure benignity. 3. Remainder of the abdomen and pelvis CT is unremarkable, as detailed above. No bowel obstruction or evidence of bowel wall inflammation. No evidence of acute solid organ abnormality. No renal or ureteral calculi. Appendix is normal. Electronically Signed   By: SFranki CabotM.D.   On: 07/25/2018 04:54   Dg Chest Port 1 View  Result Date: 07/25/2018 CLINICAL DATA:  Pain. Emesis. EXAM: PORTABLE CHEST 1 VIEW COMPARISON:  07/05/2018 FINDINGS: The cardiomediastinal contours are normal. The lungs are clear. Pulmonary vasculature is normal. No consolidation, pleural effusion, or pneumothorax. No acute osseous abnormalities are seen. IMPRESSION: No acute chest findings. Electronically Signed   By: MKeith RakeM.D.   On: 07/25/2018 03:07    EKG: Independently reviewed. Sinus tachycardia (rate 108), PAC, non-specific ST-abnormality, QTc 533.   Assessment/Plan   1. DKA; uncontrolled IDDM  - Presents with many complaints but is primarily concerned about abd pain with N/V and is found to be in mild DKA with low bicarb, elevated AG, and urine ketones  - A1c was 12.9% earlier this month  - She has been  fluid-resuscitated in ED and started on insulin infusion  - Continue insulin  infusion with frequent CBG's and serial chem panels until DKA resolves and she is tolerating a diet   2. Abdominal pain with N/V  - Presents with increase in her chronic abd pain and reports N/V   - CT abd/pelvis is notable for possible mild area of edema at right kidney raising ? of pyelo, but no CVA tenderness and UA not consistent with infection  - She is tolerated clear liquids in ED  - Acute worsening likely secondary to DKA  - Treat DKA, continue supportive care, monitor electrolytes, advance diet as she improves   3. Chest pain  - Chronic complaint  - Troponin is undetectably low despite days of constant pain  - PE considered in light of tachycardia but she is not dyspneic or hypoxic and there is no cough or evidence for DVT; tachycardia now improving with IVF  - Continue cardiac monitoring, continue beta-blocker, repeat EKG    4. Seizures  - Patient reports ongoing seizures at home despite reported adherence with medications (has been an issue in the past) and increase in Keppra to 1g BID a month ago  - Check Keppra level, continue 1000 mg BID, will also continue the Topomax she takes for migraine ppx    5. Depression, anxiety  - Continue home medications   6. Chronic systolic CHF  - Appears compensated, states that ankles usually swollen but not now  - She is being hydrated with IVF, will follow daily wt and I/O's   7. Prolonged QT  - QTc interval is 533 ms in ED  - Continue cardiac monitoring, monitor electrolytes, minimize QT-prolonging medications, and repeat EKG in am    PPE: Mask, face shield  DVT prophylaxis: Lovenox  Code Status: Full  Family Communication: Discussed with patient   Consults called: None Admission status: Observation     Vianne Bulls, MD [facility] Pager [phone]  If 7PM-7AM, please contact night-coverage [email or web address] Password [facility]  07/25/2018,  5:40 AM

## 2018-07-25 NOTE — ED Notes (Signed)
Patient transported to CT 

## 2018-07-25 NOTE — Progress Notes (Signed)
Paged NP Blount at approx 1955.Pt 10/10 abdominal pain, 2 tablet norco at 1614 ineffective; takes oxy at home. Needs Phenergan for nausea   Insulin drip stopped at 2008.  Spoke with NP Blount at 2000 and informed that patient complaining of nausea after eating and after reviewing MAR and previous administration of Phenergan, Phenergan will likely address patient's nausea. Informed NP no orders for Novolog to be given after insulin drip stopped,however, nursing orders indicate to administer after drip stopped; Novolog orders updated. Informed Blount patient on heart healthy, not carb modified diet; diet updated to carb modified diet. Confirmed patient still on NS maintenance fluids and Blount informed to stop fluids. Informed Blount patient complaining of pain and aware of previous attempts by day RN to address pain mgmt; indicated unsure of why oxycodone not ordered. Patient stated can not take ibuprofen due to swelling/hives.  Approx 0320 patient inquired why she did not receive Seroquel. Informed patient no scheduled nor PRN Seroquel available. Patient indicated she takes it every night at home and previously communicated what medications she takes at home when she was admitted.

## 2018-07-25 NOTE — Progress Notes (Signed)
This nurse has spent several hours trying to get the provider to adjust the patient's pain medication. Patient states that Norco is not working. Patient states she takes Oxycodone at home but unable to verify with pharmacy as it is closed and inpatient pharmacy unable to locate any recent fills in narcotics database. Provider has not adjusted or changed medication. Patient is dissatisfied with current medication and is in pain. Explained to patient that I cannot give her what I do not have an order for but I will try again. Patient verbalizes understanding.

## 2018-07-25 NOTE — Progress Notes (Signed)
CSW acknowledges consult.  CSW spoke with pt concerning medicaid.  Pt stated that she has a medicaid care and will contact PCP tomorrow to update medicaid card.  CSW signing off.  Reed Breech LCSWA [phone]

## 2018-07-26 DIAGNOSIS — R000 Tachycardia, unspecified: Secondary | ICD-10-CM | POA: Diagnosis not present

## 2018-07-26 DIAGNOSIS — E1010 Type 1 diabetes mellitus with ketoacidosis without coma: Secondary | ICD-10-CM

## 2018-07-26 DIAGNOSIS — Z23 Encounter for immunization: Secondary | ICD-10-CM | POA: Diagnosis not present

## 2018-07-26 DIAGNOSIS — Z20828 Contact with and (suspected) exposure to other viral communicable diseases: Secondary | ICD-10-CM | POA: Diagnosis not present

## 2018-07-26 LAB — GLUCOSE, CAPILLARY
Glucose-Capillary: 139 mg/dL — ABNORMAL HIGH (ref 70–99)
Glucose-Capillary: 196 mg/dL — ABNORMAL HIGH (ref 70–99)
Glucose-Capillary: 216 mg/dL — ABNORMAL HIGH (ref 70–99)
Glucose-Capillary: 256 mg/dL — ABNORMAL HIGH (ref 70–99)

## 2018-07-26 LAB — BASIC METABOLIC PANEL
Anion gap: 8 (ref 5–15)
BUN: 12 mg/dL (ref 6–20)
CO2: 23 mmol/L (ref 22–32)
Calcium: 8.7 mg/dL — ABNORMAL LOW (ref 8.9–10.3)
Chloride: 105 mmol/L (ref 98–111)
Creatinine, Ser: 0.53 mg/dL (ref 0.44–1.00)
GFR calc Af Amer: >60 mL/min (ref >60)
GFR calc non Af Amer: >60 mL/min (ref >60)
Glucose, Bld: 155 mg/dL — ABNORMAL HIGH (ref 70–99)
Potassium: 3.6 mmol/L (ref 3.5–5.1)
Sodium: 136 mmol/L (ref 135–145)

## 2018-07-26 LAB — URINE CULTURE

## 2018-07-26 LAB — LEVETIRACETAM LEVEL: Levetiracetam Lvl: 16 ug/mL (ref 10.0–40.0)

## 2018-07-26 MED ORDER — INSULIN LISPRO 100 UNIT/ML ~~LOC~~ SOLN: 0.0000 [IU] | Freq: Three times a day (TID) | SUBCUTANEOUS | 99 refills | Status: DC

## 2018-07-26 MED ORDER — ATORVASTATIN CALCIUM 20 MG PO TABS: 20.0000 mg | ORAL_TABLET | Freq: Every day | ORAL | 0 refills | Status: DC

## 2018-07-26 MED ORDER — INSULIN GLARGINE 100 UNIT/ML ~~LOC~~ SOLN: 20.0000 [IU] | Freq: Two times a day (BID) | SUBCUTANEOUS | 1 refills | Status: DC

## 2018-07-26 NOTE — Plan of Care (Signed)
Care Plan needs met 

## 2018-07-26 NOTE — Progress Notes (Signed)
Pt seen by MD, orders written for discharge. RN went over discharge instructions with pt and answered all questions. RN removed IV's.  Levada Dy (Case Manager) arranged for medications to be delivered to [facility] by [facility] prior to discharge. Pt reported that she had both insulin types along with needles already at home and would follow up about lipitor at her doctor's appointment tomorrow. Pt stated she needed to leave due to timing of availability of her ride.   RN escorted for discharge with all belongings. Pt will follow up outpatient with MD.

## 2018-07-26 NOTE — Discharge Summary (Signed)
Physician Discharge Summary  Nancy [name] ZJI:[id number] DOB: 06-Sep-2000 DOA: 07/25/2018  PCP: [name], NP  Admit date: 07/25/2018 Discharge date: 07/26/2018  Admitted From: Home Disposition: Home  Recommendations for Outpatient Follow-up:  1. Follow up with PCP in 1-2 weeks 2. Please obtain CBC/BMP/Mag at follow up 3. Please follow up on the following pending results: None  Home Health: None Equipment/Devices: None  Discharge Condition: Stable CODE STATUS: Full code  HPI: Per Dr. Mattie [name] is a 18 y.o. female with medical history significant for insulin-dependent diabetes mellitus, seizures, CHF, chronic pain, and depression with anxiety, now presenting to the emergency department for evaluation of multiple complaints, primarily abdominal pain with nausea and vomiting, but she also reports some recent chest pain, and 3 seizures since her last visit.  Patient reports that the chest and abdominal pain have been going on for years, has been little worse over the past 3 days, and then she developed worsening abdominal pain overnight that was followed by nausea and recurrent episodes of nonbloody vomiting.  She reports that she took her Keppra in the morning and had not vomited for several hours after that.  She reports recent adherence with her medications.  She has not been coughing and does not believe that she has had a fever.  She has been seen in the emergency department couple times since her recent admission for similar complaints, had her Keppra increased to 1 g twice daily.  ED Course: Upon arrival to the ED, patient is found to be afebrile, saturating well on room air, tachycardic, and with stable blood pressure.  EKG features sinus tachycardia with rate 108, PAC, nonspecific ST abnormality, and QTc interval of 533 ms.  Chemistry panel is notable for glucose of 135 with bicarbonate of 17 and anion gap of 17.  CBC features a mild leukocytosis.  Troponin is  undetectably low.  Pregnancy test negative.  Urine notable for glucosuria and ketonuria.  CT of the abdomen and pelvis was obtained and notable for subtle low-density focus within the lower pole the right kidney that could potentially reflect pyelonephritis, as well as mild right-sided adnexal fullness which is probably a small chronic cyst.  Chest x-ray is negative for acute cardiopulmonary disease.  Patient was given a liter of normal saline, morphine, Reglan, Phenergan, and 1 g of IV Keppra in the ED.  Hospitalists are consulted for admission.  Hospital Course: Patient admitted with DKA and started on insulin drip with IV fluid.  DKA resolved and she was transitioned to subcu insulin.  CBG remained within fair range.  GI symptoms resolved except for vague and diffuse abdominal pain.  Apparently, patient had pain seeking behavior asking for opiates and stating that she takes oxycodone at home although I could not see this on a narcotic database.   Patient discharged on current regimen of insulin which includes Lantus 20 units twice daily and sliding scale. Patient stated that he has a follow-up with her PCP tomorrow.  See individual problem list below for more.  Discharge Diagnoses:  Poorly controlled type 1 diabetes with DKA: A1c 12.9%.  Suspect noncompliance.  Supposedly on 70 units of Lantus with mealtime coverage but did not require that much of insulin here. -DKA resolved.  CBG within appropriate range -Discharged on Lantus 20 units twice daily and moderate sliding scale.  Nausea/vomiting/abdominal pain: Likely due to the above and gastroparesis.  Nausea and vomiting resolved.  Abdominal pain appears to be chronic.  Abdominal exam  with diffuse tenderness.  CT abdomen and pelvis not impressive.  Atypical chest pain with tachycardia: Resolved.  History of seizures: No seizure activity here. -Discharged on home Keppra.  History of depression/anxiety -Discharged on home Seroquel and Prozac.   QTC resolved. -Lexapro listed on her chart as well but discontinued on discharge.  Chronic systolic CHF: Her limited echo in January with EF of 45 to 50%.  Appears euvolemic.  Not on medication at home.  Defer to PCP.   Prolonged QTc: Resolved.  Suspect this to be due to electrolyte derangements in the setting of DKA.  Discharge Instructions  Discharge Instructions    Call MD for:  persistant nausea and vomiting   Complete by: As directed    Diet - low sodium heart healthy   Complete by: As directed    Discharge instructions   Complete by: As directed    It is very important that you eat your meals regularly and watch the amount of carbohydrate you eat.   Check your blood glucose 4 times a day (before meals and at bedtime), and write them down. Correction coverage during day as follow: Glucose < 70: implement hypoglycemia protocol Glucose 70 - 120: 0 units Glucose 121 - 150: 2 units Glucose 151 - 200: 3 units Glucose 201 - 250: 5 units Glucose 251 - 300: 8 units Glucose 301 - 350: 11 units Glucose 351 - 400: 15 units and call your doctor or go to emergency department  5.  Correction coverage at bedtime (9pm) Glucose < 70: implement hypoglycemia protocol Glucose  70 - 120: 0 units Glucose 121 - 150: 0 units Glucose 151 - 200: 0 units Glucose 201 - 250: 2 units Glucose 251 - 300: 3 units Glucose 301 - 350: 4 units and call you doctor or go to emergency department   Increase activity slowly   Complete by: As directed      Allergies as of 07/26/2018      Reactions   Ibuprofen Other (See Comments)   GI MD said to not take this anymore      Medication List    STOP taking these medications   escitalopram 20 MG tablet Commonly known as: LEXAPRO   Insulin Glargine 100 UNIT/ML Solostar Pen Commonly known as: LANTUS Replaced by: insulin glargine 100 UNIT/ML injection     TAKE these medications   atorvastatin 20 MG tablet Commonly known as: Lipitor Take 1 tablet (20  mg total) by mouth daily.   diclofenac sodium 1 % Gel Commonly known as: VOLTAREN Apply 2 g topically 3 (three) times daily as needed (as directed to painful sites).   FLUoxetine 20 MG capsule Commonly known as: PROZAC Take 1 capsule (20 mg total) by mouth at bedtime. Please allow patient to pick up 30 day supply for free   gabapentin 300 MG capsule Commonly known as: NEURONTIN Take 1 capsule (300 mg total) by mouth at bedtime for 30 days.   glucagon 1 MG injection Use for Severe Hypoglycemia . Inject 1 mg intramuscularly if unresponsive, unable to swallow, unconscious and/or has seizure What changed:   how much to take  how to take this  when to take this  reasons to take this  additional instructions   glucose blood test strip Commonly known as: True Metrix Blood Glucose Test Use as instructed   insulin glargine 100 UNIT/ML injection Commonly known as: LANTUS Inject 0.2 mLs (20 Units total) into the skin 2 (two) times daily. Replaces: Insulin Glargine 100  UNIT/ML Solostar Pen   insulin lispro 100 UNIT/ML injection Commonly known as: HUMALOG Inject 0-0.15 mLs (0-15 Units total) into the skin 4 (four) times daily -  with meals and at bedtime. What changed:   how much to take  when to take this  additional instructions   Insulin Pen Needle 31G X 5 MM Misc Commonly known as: B-D UF III MINI PEN NEEDLES Use as instructed. Monitor blood glucose levels twice per day   levETIRAcetam 500 MG tablet Commonly known as: KEPPRA Take 1 tablet (500 mg total) by mouth 2 (two) times daily for 30 days. What changed: how much to take   meloxicam 7.5 MG tablet Commonly known as: MOBIC Take 7.5 mg by mouth daily.   propranolol 20 MG tablet Commonly known as: INDERAL Take 1 tablet (20 mg total) by mouth 2 (two) times daily. Please allow patient to pick up 30 day supply for free   QUEtiapine 100 MG tablet Commonly known as: SEROQUEL Take 400 mg by mouth at bedtime.     topiramate 25 MG tablet Commonly known as: Topamax Take 1 tablet (25 mg total) by mouth 2 (two) times daily. Please allow patient to pick up 30 day supply for free   True Metrix Meter w/Device Kit Use as instructed. Monitor blood glucose levels twice per day   TRUEplus Lancets 28G Misc Use as instructed. Monitor blood glucose levels twice per day      Follow-up Information    [name], NP. Schedule an appointment as soon as possible for a visit in 1 week(s).   Specialty: Nurse Practitioner Contact information: [address] [place] [address] [phone]           Consultations:  None  Procedures/Studies:  2D Echo: None  Ct Abdomen Pelvis W Contrast  Result Date: 07/25/2018 CLINICAL DATA:  Acute abdominal pain, generalized, with nausea. History of ovarian cyst. EXAM: CT ABDOMEN AND PELVIS WITH CONTRAST TECHNIQUE: Multidetector CT imaging of the abdomen and pelvis was performed using the standard protocol following bolus administration of intravenous contrast. CONTRAST:  159m OMNIPAQUE IOHEXOL 300 MG/ML  SOLN COMPARISON:  None. FINDINGS: Lower chest: No acute abnormality. Hepatobiliary: No focal liver abnormality is seen. No gallstones, gallbladder wall thickening, or biliary dilatation. Pancreas: Unremarkable. No pancreatic ductal dilatation or surrounding inflammatory changes. Spleen: Normal in size without focal abnormality. Adrenals/Urinary Tract: Adrenal glands appear normal. Subtle low-density focus within the lower pole of the LEFT kidney, possible edema. No renal stone or hydronephrosis bilaterally. No perinephric fluid. No ureteral or bladder calculi identified. Bladder appears normal. Stomach/Bowel: No dilated large or small bowel loops. No convincing evidence of bowel wall inflammation. Stomach appears normal. Appendix is normal. Vascular/Lymphatic: No significant vascular findings are present. No enlarged abdominal or pelvic lymph nodes. Reproductive:  Mild RIGHT adnexal fullness, probable small chronic ovarian cyst. LEFT adnexal regions unremarkable. No free fluid or inflammatory change within either adnexal region Other: No free fluid or abscess collection seen. No free intraperitoneal air. Musculoskeletal: No acute or suspicious osseous finding. IMPRESSION: 1. Subtle low-density focus within the lower pole of the LEFT kidney, possible edema. Recommend correlation with urinalysis for any evidence of pyelonephritis. 2. Mild RIGHT adnexal fullness, probable small chronic ovarian cyst. No free fluid or inflammatory change within either adnexal region. Consider nonemergent pelvic ultrasound at some point to ensure benignity. 3. Remainder of the abdomen and pelvis CT is unremarkable, as detailed above. No bowel obstruction or evidence of bowel wall inflammation. No evidence  of acute solid organ abnormality. No renal or ureteral calculi. Appendix is normal. Electronically Signed   By: Franki Cabot M.D.   On: 07/25/2018 04:54   Ct Abdomen Pelvis W Contrast  Result Date: 07/05/2018 CLINICAL DATA:  Abdominal pain and nausea x2 weeks EXAM: CT ABDOMEN AND PELVIS WITH CONTRAST TECHNIQUE: Multidetector CT imaging of the abdomen and pelvis was performed using the standard protocol following bolus administration of intravenous contrast. CONTRAST:  164m OMNIPAQUE IOHEXOL 300 MG/ML  SOLN COMPARISON:  June 21, 2018 FINDINGS: Lower chest: The lung bases are clear. The heart size is normal. Hepatobiliary: The liver is enlarged measuring approximately 22 cm craniocaudad. Normal gallbladder.There is no biliary ductal dilation. Pancreas: Normal contours without ductal dilatation. No peripancreatic fluid collection. Spleen: No splenic laceration or hematoma. Adrenals/Urinary Tract: --Adrenal glands: No adrenal hemorrhage. --Right kidney/ureter: No hydronephrosis or perinephric hematoma. --Left kidney/ureter: No hydronephrosis or perinephric hematoma. --Urinary bladder: There is  mild diffuse bladder wall thickening. Stomach/Bowel: --Stomach/Duodenum: No hiatal hernia or other gastric abnormality. Normal duodenal course and caliber. --Small bowel: No dilatation or inflammation. --Colon: There is a moderate amount of stool throughout the colon. No CT evidence for diverticulitis or colitis. --Appendix: Normal. Vascular/Lymphatic: Normal course and caliber of the major abdominal vessels. --No retroperitoneal lymphadenopathy. --No mesenteric lymphadenopathy. --No pelvic or inguinal lymphadenopathy. Reproductive: Unremarkable Other: No ascites or free air. The abdominal wall is normal. Musculoskeletal. No acute displaced fractures. IMPRESSION: 1. Mild bladder wall thickening of unknown clinical significance. Correlation with urinalysis is recommended. 2. Otherwise, no acute intra-abdominal abnormality detected. 3. Moderate amount of stool throughout the colon. Electronically Signed   By: CConstance HolsterM.D.   On: 07/05/2018 00:34   Dg Chest Port 1 View  Result Date: 07/25/2018 CLINICAL DATA:  Pain. Emesis. EXAM: PORTABLE CHEST 1 VIEW COMPARISON:  07/05/2018 FINDINGS: The cardiomediastinal contours are normal. The lungs are clear. Pulmonary vasculature is normal. No consolidation, pleural effusion, or pneumothorax. No acute osseous abnormalities are seen. IMPRESSION: No acute chest findings. Electronically Signed   By: MKeith RakeM.D.   On: 07/25/2018 03:07   Dg Chest Portable 1 View  Result Date: 07/05/2018 CLINICAL DATA:  Abdominal pain. EXAM: PORTABLE CHEST 1 VIEW COMPARISON:  June 20, 2018 FINDINGS: The cardiac silhouette is stable. There is no pneumothorax. No large pleural effusion. No infiltrate. No acute osseous abnormality. IMPRESSION: No active disease. Electronically Signed   By: CConstance HolsterM.D.   On: 07/05/2018 00:53      Subjective: No major events overnight of this morning.  CBG remained stable.  Complains of vague abdominal pain that she could not  describe.  Pain is diffuse.  No radiation to her back.  No urinary symptoms.  She says pain is chronic and comes and goes.    Discharge Exam: Vitals:   07/26/18 0807 07/26/18 1214  BP: 107/73 101/71  Pulse: 100 78  Resp: 18 20  Temp: 98.5 F (36.9 C) 98.4 F (36.9 C)  SpO2: 95% 100%    GENERAL: No acute distress.  Appears well.  HEENT: MMM.  Vision and hearing grossly intact.  NECK: Supple.  No JVD.  LUNGS:  No IWOB. Good air movement bilaterally. HEART:  RRR. Heart sounds normal.  ABD: Bowel sounds present. Soft.  Diffuse tenderness. MSK/EXT:  Moves all extremities. No apparent deformity. No edema bilaterally. SKIN: no apparent skin lesion or wound NEURO: Awake, alert and oriented appropriately.  No gross deficit.  PSYCH: Calm. Normal affect.     The  results of significant diagnostics from this hospitalization (including imaging, microbiology, ancillary and laboratory) are listed below for reference.     Microbiology: Recent Results (from the past 240 hour(s))  Urine culture     Status: Abnormal   Collection Time: 07/25/18  5:01 AM   Specimen: Urine, Random  Result Value Ref Range Status   Specimen Description URINE, RANDOM  Final   Special Requests   Final    NONE Performed at [facility], [address], [place], [place] [address]    Culture MULTIPLE SPECIES PRESENT, SUGGEST RECOLLECTION (A)  Final   Report Status 07/26/2018 FINAL  Final  SARS Coronavirus 2 (CEPHEID - Performed in [place] hospital lab), Hosp Order     Status: None   Collection Time: 07/25/18  7:44 AM   Specimen: Nasopharyngeal Swab  Result Value Ref Range Status   SARS Coronavirus 2 NEGATIVE NEGATIVE Final    Comment: (NOTE) If result is NEGATIVE SARS-CoV-2 target nucleic acids are NOT DETECTED. The SARS-CoV-2 RNA is generally detectable in upper and lower  respiratory specimens during the acute phase of infection. The lowest  concentration of SARS-CoV-2 viral copies this assay can  detect is 250  copies / mL. A negative result does not preclude SARS-CoV-2 infection  and should not be used as the sole basis for treatment or other  patient management decisions.  A negative result may occur with  improper specimen collection / handling, submission of specimen other  than nasopharyngeal swab, presence of viral mutation(s) within the  areas targeted by this assay, and inadequate number of viral copies  (<250 copies / mL). A negative result must be combined with clinical  observations, patient history, and epidemiological information. If result is POSITIVE SARS-CoV-2 target nucleic acids are DETECTED. The SARS-CoV-2 RNA is generally detectable in upper and lower  respiratory specimens dur ing the acute phase of infection.  Positive  results are indicative of active infection with SARS-CoV-2.  Clinical  correlation with patient history and other diagnostic information is  necessary to determine patient infection status.  Positive results do  not rule out bacterial infection or co-infection with other viruses. If result is PRESUMPTIVE POSTIVE SARS-CoV-2 nucleic acids MAY BE PRESENT.   A presumptive positive result was obtained on the submitted specimen  and confirmed on repeat testing.  While 2019 novel coronavirus  (SARS-CoV-2) nucleic acids may be present in the submitted sample  additional confirmatory testing may be necessary for epidemiological  and / or clinical management purposes  to differentiate between  SARS-CoV-2 and other Sarbecovirus currently known to infect humans.  If clinically indicated additional testing with an alternate test  methodology [phone]) is advised. The SARS-CoV-2 RNA is generally  detectable in upper and lower respiratory sp ecimens during the acute  phase of infection. The expected result is Negative. Fact Sheet for Patients:  StrictlyIdeas.no Fact Sheet for Healthcare  Providers: BankingDealers.co.za This test is not yet approved or cleared by the [place] FDA and has been authorized for detection and/or diagnosis of SARS-CoV-2 by FDA under an Emergency Use Authorization (EUA).  This EUA will remain in effect (meaning this test can be used) for the duration of the COVID-19 declaration under Section 564(b)(1) of the Act, 21 U.S.C. section 360bbb-3(b)(1), unless the authorization is terminated or revoked sooner. Performed at [facility], [place] [address] [place],  [address]      Labs: BNP (last 3 results) Recent Labs    01/16/18 1830 01/24/18  1313 01/29/18 2134  BNP 5.6 4.8 73.5   Basic Metabolic Panel: Recent Labs  Lab 07/25/18 0240 07/25/18 0308 07/25/18 0610 07/25/18 1008 07/25/18 1356 07/26/18 0204  NA 135 136 136   136 137 136 136  K 4.2 4.1 3.8   3.7 4.0 3.3* 3.6  CL 101  --  103   103 105 105 105  CO2 17*  --  15*   17* 22 22 23   GLUCOSE 435*  --  421*   421* 125* 157* 155*  BUN 6  --  7   8 6 6 12   CREATININE 0.69  --  0.89   0.89 0.64 0.44 0.53  CALCIUM 9.2  --  9.0   9.0 9.0 8.8* 8.7*  MG  --   --  2.1  --   --   --   PHOS  --   --  3.9  --   --   --    Liver Function Tests: Recent Labs  Lab 07/25/18 0240 07/25/18 0610  AST 14*  --   ALT 21  --   ALKPHOS 85  --   BILITOT 1.3*  --   PROT 7.1  --   ALBUMIN 4.1 3.8   Recent Labs  Lab 07/25/18 0240  LIPASE 20   No results for input(s): AMMONIA in the last 168 hours. CBC: Recent Labs  Lab 07/25/18 0240 07/25/18 0308  WBC 10.9*  --   HGB 13.6 14.6  HCT 40.9 43.0  MCV 84.3  --   PLT 300  --    Cardiac Enzymes: No results for input(s): CKTOTAL, CKMB, CKMBINDEX, TROPONINI in the last 168 hours. BNP: Invalid input(s): POCBNP CBG: Recent Labs  Lab 07/25/18 2354 07/26/18 0515 07/26/18 0806 07/26/18 1212 07/26/18 1220  GLUCAP 264* 139* 196* 216* 256*   D-Dimer No results for input(s): DDIMER in the last 72  hours. Hgb A1c No results for input(s): HGBA1C in the last 72 hours. Lipid Profile No results for input(s): CHOL, HDL, LDLCALC, TRIG, CHOLHDL, LDLDIRECT in the last 72 hours. Thyroid function studies No results for input(s): TSH, T4TOTAL, T3FREE, THYROIDAB in the last 72 hours.  Invalid input(s): FREET3 Anemia work up No results for input(s): VITAMINB12, FOLATE, FERRITIN, TIBC, IRON, RETICCTPCT in the last 72 hours. Urinalysis    Component Value Date/Time   COLORURINE STRAW (A) 07/25/2018 0522   APPEARANCEUR CLEAR 07/25/2018 0522   LABSPEC >1.046 (H) 07/25/2018 0522   PHURINE 6.0 07/25/2018 0522   GLUCOSEU >=500 (A) 07/25/2018 0522   HGBUR NEGATIVE 07/25/2018 0522   BILIRUBINUR NEGATIVE 07/25/2018 0522   BILIRUBINUR negative 07/13/2018 1651   KETONESUR 80 (A) 07/25/2018 0522   PROTEINUR NEGATIVE 07/25/2018 0522   UROBILINOGEN 0.2 07/13/2018 1651   UROBILINOGEN 1.0 07/09/2017 1324   NITRITE NEGATIVE 07/25/2018 0522   LEUKOCYTESUR NEGATIVE 07/25/2018 0522   Sepsis Labs Invalid input(s): PROCALCITONIN,  WBC,  LACTICIDVEN   Time coordinating discharge: 35 minutes  SIGNED:  Mercy Riding, MD  [facility] 07/26/2018, 6:03 PM  If 7PM-7AM, please contact night-coverage [email or web address] Password [email or web address]

## 2018-07-27 ENCOUNTER — Ambulatory Visit: Payer: Medicaid Other | Admitting: Pharmacist

## 2018-07-28 ENCOUNTER — Emergency Department (HOSPITAL_COMMUNITY): Payer: Medicaid Other

## 2018-07-28 ENCOUNTER — Emergency Department (HOSPITAL_COMMUNITY)
Admission: EM | Admit: 2018-07-28 | Discharge: 2018-07-29 | Disposition: A | Discharge status: Home / Self Care | Payer: Medicaid Other | Attending: Emergency Medicine | Admitting: Emergency Medicine

## 2018-07-28 ENCOUNTER — Encounter (HOSPITAL_COMMUNITY): Payer: Self-pay | Admitting: Emergency Medicine

## 2018-07-28 ENCOUNTER — Other Ambulatory Visit: Payer: Self-pay

## 2018-07-28 DIAGNOSIS — Z794 Long term (current) use of insulin: Secondary | ICD-10-CM | POA: Insufficient documentation

## 2018-07-28 DIAGNOSIS — Z79899 Other long term (current) drug therapy: Secondary | ICD-10-CM | POA: Diagnosis not present

## 2018-07-28 DIAGNOSIS — R072 Precordial pain: Secondary | ICD-10-CM | POA: Insufficient documentation

## 2018-07-28 DIAGNOSIS — R739 Hyperglycemia, unspecified: Secondary | ICD-10-CM

## 2018-07-28 DIAGNOSIS — I509 Heart failure, unspecified: Secondary | ICD-10-CM | POA: Diagnosis not present

## 2018-07-28 DIAGNOSIS — E1165 Type 2 diabetes mellitus with hyperglycemia: Secondary | ICD-10-CM | POA: Insufficient documentation

## 2018-07-28 DIAGNOSIS — R1110 Vomiting, unspecified: Secondary | ICD-10-CM | POA: Diagnosis not present

## 2018-07-28 DIAGNOSIS — R1084 Generalized abdominal pain: Secondary | ICD-10-CM

## 2018-07-28 DIAGNOSIS — R0789 Other chest pain: Secondary | ICD-10-CM | POA: Diagnosis present

## 2018-07-28 LAB — CBG MONITORING, ED: Glucose-Capillary: 425 mg/dL — ABNORMAL HIGH (ref 70–99)

## 2018-07-28 MED ORDER — SODIUM CHLORIDE 0.9 % IV BOLUS (SEPSIS)
1000.0000 mL | Freq: Once | INTRAVENOUS | Status: AC
  Administered 2018-07-28: 1000 mL via INTRAVENOUS

## 2018-07-28 MED ORDER — FENTANYL CITRATE (PF) 100 MCG/2ML IJ SOLN
100.0000 ug | Freq: Once | INTRAMUSCULAR | Status: AC
  Administered 2018-07-28: 100 ug via INTRAVENOUS
  Filled 2018-07-28: qty 2

## 2018-07-28 MED ORDER — ONDANSETRON HCL 4 MG/2ML IJ SOLN
4.0000 mg | Freq: Once | INTRAMUSCULAR | Status: AC
  Administered 2018-07-28: 4 mg via INTRAVENOUS
  Filled 2018-07-28: qty 2

## 2018-07-28 MED ORDER — LEVETIRACETAM IN NACL 500 MG/100ML IV SOLN
500.0000 mg | Freq: Once | INTRAVENOUS | Status: AC
  Administered 2018-07-29: 01:00:00 500 mg via INTRAVENOUS
  Filled 2018-07-28: qty 100

## 2018-07-28 NOTE — ED Triage Notes (Signed)
Pt BIB EMS from home with c/o continued abdominal pain and CP over a week, even after being d/c from the hospital for DKA 2 days ago. Pt c/o CP during inspiration and exhalation. Per EMS, pt stated she had a seizure at [place] with LOC and incontinence episode. Pt insulin was changed recently, she takes it as prescribed but does not monitor her glucose when she takes it. Pt is in stage 4 CHF. Lungs CTA, CBG 490, Slightly Hypertensive, Pt given 4 zofran with no relief. Allergic to ibuprofen. Pt reports no new symptoms since her discharge.

## 2018-07-28 NOTE — ED Provider Notes (Signed)
[facility] EMERGENCY DEPARTMENT Provider Note   CSN: [id number] Arrival date & time: 07/28/18  2238     History   Chief Complaint Chief Complaint - chest pain, abdominal pain, seizure, hyperglycemia  HPI Nancy [name] is a 18 y.o. female.     The history is provided by the patient.  Emesis Severity:  Moderate Timing:  Intermittent Progression:  Worsening Chronicity:  Recurrent Relieved by:  Nothing Worsened by:  Nothing Associated symptoms: abdominal pain   Associated symptoms: no cough and no fever   Risk factors: diabetes   Patient with history of diabetes, seizures presents multiple complaints.  She reports over the past several days she has had ongoing chest and abdominal pain.  She reports frequent episodes of vomiting.  She is also reporting increasing seizures. She was just discharged in the hospital about 2 days ago for DKA. She reports getting chest pain frequently, but reports this is different. She reports multiple seizures despite taking her Keppra twice a day. She has been vomiting, therefore she might not be able to keep all of her medicines down Past Medical History:  Diagnosis Date  . Acanthosis nigricans   . Anxiety   . CHF (congestive heart failure) ([place])   . Chronic lower back pain   . Depression   . Dyspepsia   . Obesity   . Ovarian cyst    pt is not aware of this hx (11/24/2017)  . Pre-diabetes   . 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 Premature baby   . Seizures ([place])   . Type II diabetes mellitus ([facility])    insulin dependant    Patient Active Problem List   Diagnosis Date Noted  . Depression with anxiety 07/25/2018  . DKA (diabetic ketoacidoses) ([place]) 07/25/2018  . Prolonged QT interval 07/25/2018  . Seizures ([place]) 06/14/2018  . Syncope 01/30/2018  . Orthostatic hypotension 01/24/2018  . DKA (diabetic ketoacidosis) ([place]) 01/24/2018  . Chronic abdominal pain 12/24/2017  . Sinus tachycardia by electrocardiogram   .  Acute lower UTI   . Uncontrolled type 2 diabetes mellitus with hyperglycemia ([place])   . Chest pain 12/19/2017  . Generalized abdominal pain 08/21/2017  . Non compliance with medical treatment 01/27/2012  . Adjustment disorder 09/16/2011  . DM (diabetes mellitus), type 1 ([place]) 09/15/2011  . Dyspepsia   . Acanthosis nigricans   . Goiter   . Obesity 06/14/2010  . Hypertension 06/14/2010    Past Surgical History:  Procedure Laterality Date  . ABDOMINAL HERNIA REPAIR     "I was a baby"  . HERNIA REPAIR    . TONSILLECTOMY AND ADENOIDECTOMY    . WISDOM TOOTH EXTRACTION  2017     OB History   No obstetric history on file.      Home Medications    Prior to Admission medications   Medication Sig Start Date End Date Taking? Authorizing Provider  atorvastatin (LIPITOR) 20 MG tablet Take 1 tablet (20 mg total) by mouth daily. 07/26/18 10/24/18  Mercy Riding, MD  Blood Glucose Monitoring Suppl (TRUE METRIX METER) w/Device KIT Use as instructed. Monitor blood glucose levels twice per day 12/02/17   [name], NP  diclofenac sodium (VOLTAREN) 1 % GEL Apply 2 g topically 3 (three) times daily as needed (as directed to painful sites).     [provider]  FLUoxetine (PROZAC) 20 MG capsule Take 1 capsule (20 mg total) by mouth at bedtime. Please allow patient to pick up 30 day  supply for free 07/13/18 08/12/18  [name], NP  gabapentin (NEURONTIN) 300 MG capsule Take 1 capsule (300 mg total) by mouth at bedtime for 30 days. 06/02/18 07/05/27  [name], NP  glucagon 1 MG injection Use for Severe Hypoglycemia . Inject 1 mg intramuscularly if unresponsive, unable to swallow, unconscious and/or has seizure Patient taking differently: Inject 1 mg into the muscle once as needed (for severe hypoglycemia- if unresponsive, unable to swallow, unconscious, and/or has had seizure(s)).  07/20/13 12/19/25  Renato Shin, MD  glucose blood (TRUE METRIX BLOOD GLUCOSE TEST) test strip Use  as instructed 02/15/18   [name], NP  insulin glargine (LANTUS) 100 UNIT/ML injection Inject 0.2 mLs (20 Units total) into the skin 2 (two) times daily. 07/26/18   Mercy Riding, MD  insulin lispro (HUMALOG) 100 UNIT/ML injection Inject 0-0.15 mLs (0-15 Units total) into the skin 4 (four) times daily -  with meals and at bedtime. 07/26/18 08/25/18  Mercy Riding, MD  Insulin Pen Needle (B-D UF III MINI PEN NEEDLES) 31G X 5 MM MISC Use as instructed. Monitor blood glucose levels twice per day 02/15/18   [name], NP  levETIRAcetam (KEPPRA) 500 MG tablet Take 1 tablet (500 mg total) by mouth 2 (two) times daily for 30 days. Patient taking differently: Take 1,000 mg by mouth 2 (two) times daily.  06/15/18 07/25/18  Little Ishikawa, MD  meloxicam (MOBIC) 7.5 MG tablet Take 7.5 mg by mouth daily.    [provider]  propranolol (INDERAL) 20 MG tablet Take 1 tablet (20 mg total) by mouth 2 (two) times daily. Please allow patient to pick up 30 day supply for free 07/13/18 08/12/18  [name], NP  QUEtiapine (SEROQUEL) 100 MG tablet Take 400 mg by mouth at bedtime.     [provider]  topiramate (TOPAMAX) 25 MG tablet Take 1 tablet (25 mg total) by mouth 2 (two) times daily. Please allow patient to pick up 30 day supply for free 07/13/18 08/12/18  [name], NP  TRUEPLUS LANCETS 28G MISC Use as instructed. Monitor blood glucose levels twice per day 12/02/17   [name], NP    Family History Family History  Problem Relation Age of Onset  . Diabetes Mother   . Hypertension Mother   . Obesity Mother   . Asthma Mother   . Allergic rhinitis Mother   . Eczema Mother   . Diabetes Father   . Hypertension Father   . Obesity Father   . Hyperlipidemia Father   . Hypertension Paternal Aunt   . Hypertension Maternal Grandfather   . Colon cancer Maternal Grandfather   . Diabetes Paternal Grandmother   . Obesity Paternal Grandmother   . Diabetes Paternal  Grandfather   . Obesity Paternal Grandfather   . Angioedema Neg Hx   . Immunodeficiency Neg Hx   . Urticaria Neg Hx   . Stomach cancer Neg Hx   . Esophageal cancer Neg Hx     Social History Social History   Tobacco Use  . Smoking status: Never Smoker  . Smokeless tobacco: Never Used  Substance Use Topics  . Alcohol use: No    Alcohol/week: 0.0 standard drinks  . Drug use: No     Allergies   Ibuprofen   Review of Systems Review of Systems  Constitutional: Negative for fever.  Respiratory: Negative for cough.   Cardiovascular: Positive for chest pain.  Gastrointestinal: Positive for abdominal  pain and vomiting.  Neurological: Positive for seizures.  All other systems reviewed and are negative.    Physical Exam Updated Vital Signs BP (!) 142/95 (BP Location: Right Arm)   Pulse (!) 110   Temp 99.9 F (37.7 C) (Oral)   Resp 20   Ht 1.6 m (_0 )   Wt 83.9 kg   SpO2 100%   BMI 32.77 kg/m   Physical Exam CONSTITUTIONAL: Well developed/well nourished, anxious HEAD: Normocephalic/atraumatic EYES: EOMI ENMT: Mucous membranes moist NECK: supple no meningeal signs SPINE/BACK:entire spine nontender CV: S1/S2 noted, no murmurs/rubs/gallops noted LUNGS: Lungs are clear to auscultation bilaterally, no apparent distress Chest-no chest wall tenderness ABDOMEN: soft, mild diffuse tenderness, no rebound or guarding, bowel sounds noted throughout abdomen GU:no cva tenderness NEURO: Pt is awake/alert/appropriate, moves all extremitiesx4.  No facial droop.   EXTREMITIES: pulses normal/equal, full ROM SKIN: warm, color normal PSYCH: Anxious  ED Treatments / Results  Labs (all labs ordered are listed, but only abnormal results are displayed) Labs Reviewed  BASIC METABOLIC PANEL - Abnormal; Notable for the following components:      Result Value   Potassium 3.4 (*)    CO2 21 (*)    Glucose, Bld 422 (*)    BUN <5 (*)    Calcium 8.6 (*)    All other components within  normal limits  URINALYSIS, ROUTINE W REFLEX MICROSCOPIC - Abnormal; Notable for the following components:   Color, Urine STRAW (*)    Glucose, UA >=500 (*)    All other components within normal limits  CBG MONITORING, ED - Abnormal; Notable for the following components:   Glucose-Capillary 425 (*)    All other components within normal limits  CBG MONITORING, ED - Abnormal; Notable for the following components:   Glucose-Capillary 221 (*)    All other components within normal limits  CBC WITH DIFFERENTIAL/PLATELET  POC URINE PREG, ED  CBG MONITORING, ED    EKG EKG Interpretation  Date/Time:  Wednesday July 28 2018 23:04:56 EDT Ventricular Rate:  108 PR Interval:    QRS Duration: 84 QT Interval:  341 QTC Calculation: 457 R Axis:   85 Text Interpretation:  Sinus tachycardia No significant change since last tracing Confirmed by Ripley Fraise ([id number]) on 07/29/2018 12:41:46 AM   Radiology Dg Chest Port 1 View  Result Date: 07/28/2018 CLINICAL DATA:  Chest pain. EXAM: PORTABLE CHEST 1 VIEW COMPARISON:  Multiple prior exams most recent radiograph 3 days ago. FINDINGS: The cardiomediastinal contours are normal. The lungs are clear. Pulmonary vasculature is normal. No consolidation, pleural effusion, or pneumothorax. No acute osseous abnormalities are seen. IMPRESSION: Negative AP view of the chest. Electronically Signed   By: Keith Rake M.D.   On: 07/28/2018 23:47    Procedures Procedures  Medications Ordered in ED Medications  fentaNYL (SUBLIMAZE) injection 50 mcg (has no administration in time range)  fentaNYL (SUBLIMAZE) injection 100 mcg (100 mcg Intravenous Given 07/28/18 2338)  ondansetron (ZOFRAN) injection 4 mg (4 mg Intravenous Given 07/28/18 2338)  levETIRAcetam (KEPPRA) IVPB 500 mg/100 mL premix (0 mg Intravenous Stopped 07/29/18 0136)  sodium chloride 0.9 % bolus 1,000 mL (0 mLs Intravenous Stopped 07/29/18 0008)  promethazine (PHENERGAN) injection 12.5 mg (12.5 mg  Intravenous Given 07/29/18 0131)  insulin aspart (novoLOG) injection 10 Units (10 Units Subcutaneous Given 07/29/18 0144)     Initial Impression / Assessment and Plan / ED Course  I have reviewed the triage vital signs and the nursing notes.  Pertinent labs &  imaging results that were available during my care of the patient were reviewed by me and considered in my medical decision making (see chart for details).        11:32 PM Patient presents with multiple complaints.  She reports continued chest abdominal pain, which appear to be chronic.  Also reports recent history of DKA, now reports hyperglycemia as well as vomiting Recent admission, she had negative CT abdomen pelvis Labs/imaging pending at this time 12:43 AM Patient reports no improvement in her pain.  In fact she reports she has not felt any improvement since her last admission 3:57 AM Patient monitored for several hours.  She appears improved.  Vitals are improved.  No distress, she has been resting comfortably.  No vomiting No Signs of DKA. She has had multiple re-evaluations previously. She has had multiple CT scans of her abdomen/pelvis, will defer further imaging. Low suspicion for ACS/PE at this time.  Patient feels uncomfortable going home.  I had a long discussion with her that she has had multiple evaluations before, and her work-up thus far is reassuring.  At this point I see no indication for readmission. We did agree to stop Zofran, will start Phenergan orally at home.  No prolonged QT on EKG tonight, potassium is normal   She reports she has PCP follow-up next week.  Patient reports she has monitor for her glucose at home and is taking her insulin. Final Clinical Impressions(s) / ED Diagnoses   Final diagnoses:  Hyperglycemia  Precordial pain  Generalized abdominal pain  Non-intractable vomiting, presence of nausea not specified, unspecified vomiting type    ED Discharge Orders         Ordered     promethazine (PHENERGAN) 25 MG tablet  Every 8 hours PRN     07/29/18 0348           Ripley Fraise, MD 07/29/18 [phone]

## 2018-07-29 LAB — BASIC METABOLIC PANEL
Anion gap: 11 (ref 5–15)
BUN: <5 mg/dL — ABNORMAL LOW (ref 6–20)
CO2: 21 mmol/L — ABNORMAL LOW (ref 22–32)
Calcium: 8.6 mg/dL — ABNORMAL LOW (ref 8.9–10.3)
Chloride: 103 mmol/L (ref 98–111)
Creatinine, Ser: 0.62 mg/dL (ref 0.44–1.00)
GFR calc Af Amer: >60 mL/min (ref >60)
GFR calc non Af Amer: >60 mL/min (ref >60)
Glucose, Bld: 422 mg/dL — ABNORMAL HIGH (ref 70–99)
Potassium: 3.4 mmol/L — ABNORMAL LOW (ref 3.5–5.1)
Sodium: 135 mmol/L (ref 135–145)

## 2018-07-29 LAB — CBC WITH DIFFERENTIAL/PLATELET
Abs Immature Granulocytes: 0.04 10*3/uL (ref 0.00–0.07)
Basophils Absolute: 0 10*3/uL (ref 0.0–0.1)
Basophils Relative: 0 %
Eosinophils Absolute: 0.1 10*3/uL (ref 0.0–0.5)
Eosinophils Relative: 1 %
HCT: 37.1 % (ref 36.0–46.0)
Hemoglobin: 12 g/dL (ref 12.0–15.0)
Immature Granulocytes: 0 %
Lymphocytes Relative: 20 %
Lymphs Abs: 1.9 10*3/uL (ref 0.7–4.0)
MCH: 28 pg (ref 26.0–34.0)
MCHC: 32.3 g/dL (ref 30.0–36.0)
MCV: 86.7 fL (ref 80.0–100.0)
Monocytes Absolute: 0.9 10*3/uL (ref 0.1–1.0)
Monocytes Relative: 9 %
Neutro Abs: 6.6 10*3/uL (ref 1.7–7.7)
Neutrophils Relative %: 70 %
Platelets: 257 10*3/uL (ref 150–400)
RBC: 4.28 MIL/uL (ref 3.87–5.11)
RDW: 12.8 % (ref 11.5–15.5)
WBC: 9.5 10*3/uL (ref 4.0–10.5)
nRBC: 0 % (ref 0.0–0.2)

## 2018-07-29 LAB — URINALYSIS, ROUTINE W REFLEX MICROSCOPIC
Bacteria, UA: NONE SEEN
Bilirubin Urine: NEGATIVE
Glucose, UA: >=500 mg/dL — AB
Hgb urine dipstick: NEGATIVE
Ketones, ur: NEGATIVE mg/dL
Leukocytes,Ua: NEGATIVE
Nitrite: NEGATIVE
Protein, ur: NEGATIVE mg/dL
Specific Gravity, Urine: 1.029 (ref 1.005–1.030)
pH: 7 (ref 5.0–8.0)

## 2018-07-29 LAB — POC URINE PREG, ED: Preg Test, Ur: NEGATIVE

## 2018-07-29 LAB — CBG MONITORING, ED: Glucose-Capillary: 221 mg/dL — ABNORMAL HIGH (ref 70–99)

## 2018-07-29 MED ORDER — PROMETHAZINE HCL 25 MG/ML IJ SOLN
12.5000 mg | Freq: Once | INTRAMUSCULAR | Status: AC
  Administered 2018-07-29: 02:00:00 12.5 mg via INTRAVENOUS
  Filled 2018-07-29: qty 1

## 2018-07-29 MED ORDER — FENTANYL CITRATE (PF) 100 MCG/2ML IJ SOLN: 50.0000 ug | Freq: Once | INTRAMUSCULAR | Status: DC

## 2018-07-29 MED ORDER — INSULIN ASPART 100 UNIT/ML ~~LOC~~ SOLN
10.0000 [IU] | Freq: Once | SUBCUTANEOUS | Status: AC
  Administered 2018-07-29: 10 [IU] via SUBCUTANEOUS

## 2018-07-29 MED ORDER — PROMETHAZINE HCL 25 MG PO TABS: 25.0000 mg | ORAL_TABLET | Freq: Three times a day (TID) | ORAL | 0 refills | Status: DC | PRN

## 2018-07-29 MED FILL — PROMETHAZINE 25 MG TABLET: 25 | 5 days supply | Qty: 15 | Fill #0

## 2018-08-03 ENCOUNTER — Inpatient Hospital Stay (HOSPITAL_COMMUNITY)
Admission: EM | Admit: 2018-08-03 | Discharge: 2018-08-05 | DRG: 101 | Disposition: A | Discharge status: Home / Self Care | Payer: Medicaid Other | Attending: Internal Medicine | Admitting: Internal Medicine

## 2018-08-03 ENCOUNTER — Emergency Department (HOSPITAL_COMMUNITY)
Admission: EM | Admit: 2018-08-03 | Discharge: 2018-08-03 | Disposition: A | Discharge status: Home / Self Care | Payer: Medicaid Other | Source: Home / Self Care | Attending: Emergency Medicine | Admitting: Emergency Medicine

## 2018-08-03 ENCOUNTER — Emergency Department (HOSPITAL_COMMUNITY): Payer: Medicaid Other

## 2018-08-03 ENCOUNTER — Other Ambulatory Visit: Payer: Self-pay

## 2018-08-03 DIAGNOSIS — I152 Hypertension secondary to endocrine disorders: Secondary | ICD-10-CM | POA: Diagnosis present

## 2018-08-03 DIAGNOSIS — N179 Acute kidney failure, unspecified: Secondary | ICD-10-CM

## 2018-08-03 DIAGNOSIS — Z794 Long term (current) use of insulin: Secondary | ICD-10-CM

## 2018-08-03 DIAGNOSIS — Z20828 Contact with and (suspected) exposure to other viral communicable diseases: Secondary | ICD-10-CM | POA: Insufficient documentation

## 2018-08-03 DIAGNOSIS — G8929 Other chronic pain: Secondary | ICD-10-CM | POA: Diagnosis present

## 2018-08-03 DIAGNOSIS — Z8249 Family history of ischemic heart disease and other diseases of the circulatory system: Secondary | ICD-10-CM

## 2018-08-03 DIAGNOSIS — E876 Hypokalemia: Secondary | ICD-10-CM | POA: Diagnosis present

## 2018-08-03 DIAGNOSIS — R569 Unspecified convulsions: Secondary | ICD-10-CM

## 2018-08-03 DIAGNOSIS — I110 Hypertensive heart disease with heart failure: Secondary | ICD-10-CM | POA: Insufficient documentation

## 2018-08-03 DIAGNOSIS — E1065 Type 1 diabetes mellitus with hyperglycemia: Secondary | ICD-10-CM | POA: Diagnosis present

## 2018-08-03 DIAGNOSIS — R739 Hyperglycemia, unspecified: Secondary | ICD-10-CM

## 2018-08-03 DIAGNOSIS — E860 Dehydration: Secondary | ICD-10-CM

## 2018-08-03 DIAGNOSIS — I10 Essential (primary) hypertension: Secondary | ICD-10-CM

## 2018-08-03 DIAGNOSIS — R9431 Abnormal electrocardiogram [ECG] [EKG]: Secondary | ICD-10-CM

## 2018-08-03 DIAGNOSIS — G40909 Epilepsy, unspecified, not intractable, without status epilepticus: Principal | ICD-10-CM | POA: Diagnosis present

## 2018-08-03 DIAGNOSIS — F329 Major depressive disorder, single episode, unspecified: Secondary | ICD-10-CM

## 2018-08-03 DIAGNOSIS — E1165 Type 2 diabetes mellitus with hyperglycemia: Secondary | ICD-10-CM

## 2018-08-03 DIAGNOSIS — Z79899 Other long term (current) drug therapy: Secondary | ICD-10-CM

## 2018-08-03 DIAGNOSIS — M545 Low back pain: Secondary | ICD-10-CM | POA: Diagnosis present

## 2018-08-03 DIAGNOSIS — Z800 Family history of malignant neoplasm of digestive organs: Secondary | ICD-10-CM

## 2018-08-03 DIAGNOSIS — F419 Anxiety disorder, unspecified: Secondary | ICD-10-CM

## 2018-08-03 DIAGNOSIS — E119 Type 2 diabetes mellitus without complications: Secondary | ICD-10-CM | POA: Insufficient documentation

## 2018-08-03 DIAGNOSIS — Z8349 Family history of other endocrine, nutritional and metabolic diseases: Secondary | ICD-10-CM

## 2018-08-03 DIAGNOSIS — R55 Syncope and collapse: Secondary | ICD-10-CM

## 2018-08-03 DIAGNOSIS — Z91199 Patient's noncompliance with other medical treatment and regimen due to unspecified reason: Secondary | ICD-10-CM

## 2018-08-03 DIAGNOSIS — Z825 Family history of asthma and other chronic lower respiratory diseases: Secondary | ICD-10-CM

## 2018-08-03 DIAGNOSIS — Z833 Family history of diabetes mellitus: Secondary | ICD-10-CM

## 2018-08-03 DIAGNOSIS — Z9119 Patient's noncompliance with other medical treatment and regimen: Secondary | ICD-10-CM | POA: Diagnosis not present

## 2018-08-03 DIAGNOSIS — I5020 Unspecified systolic (congestive) heart failure: Secondary | ICD-10-CM | POA: Diagnosis present

## 2018-08-03 DIAGNOSIS — I509 Heart failure, unspecified: Secondary | ICD-10-CM | POA: Insufficient documentation

## 2018-08-03 LAB — BASIC METABOLIC PANEL
Anion gap: 12 (ref 5–15)
BUN: 6 mg/dL (ref 6–20)
CO2: 21 mmol/L — ABNORMAL LOW (ref 22–32)
Calcium: 8.5 mg/dL — ABNORMAL LOW (ref 8.9–10.3)
Chloride: 108 mmol/L (ref 98–111)
Creatinine, Ser: 1.82 mg/dL — ABNORMAL HIGH (ref 0.44–1.00)
GFR calc Af Amer: 46 mL/min — ABNORMAL LOW (ref >60)
GFR calc non Af Amer: 39 mL/min — ABNORMAL LOW (ref >60)
Glucose, Bld: 345 mg/dL — ABNORMAL HIGH (ref 70–99)
Potassium: 3.4 mmol/L — ABNORMAL LOW (ref 3.5–5.1)
Sodium: 141 mmol/L (ref 135–145)

## 2018-08-03 LAB — CBC WITH DIFFERENTIAL/PLATELET
Abs Immature Granulocytes: 0.1 10*3/uL — ABNORMAL HIGH (ref 0.00–0.07)
Basophils Absolute: 0.1 10*3/uL (ref 0.0–0.1)
Basophils Relative: 1 %
Eosinophils Absolute: 0 10*3/uL (ref 0.0–0.5)
Eosinophils Relative: 0 %
HCT: 42.2 % (ref 36.0–46.0)
Hemoglobin: 13.8 g/dL (ref 12.0–15.0)
Immature Granulocytes: 1 %
Lymphocytes Relative: 35 %
Lymphs Abs: 3.5 10*3/uL (ref 0.7–4.0)
MCH: 28.5 pg (ref 26.0–34.0)
MCHC: 32.7 g/dL (ref 30.0–36.0)
MCV: 87 fL (ref 80.0–100.0)
Monocytes Absolute: 0.8 10*3/uL (ref 0.1–1.0)
Monocytes Relative: 8 %
Neutro Abs: 5.4 10*3/uL (ref 1.7–7.7)
Neutrophils Relative %: 55 %
Platelets: 338 10*3/uL (ref 150–400)
RBC: 4.85 MIL/uL (ref 3.87–5.11)
RDW: 12.8 % (ref 11.5–15.5)
WBC: 9.9 10*3/uL (ref 4.0–10.5)
nRBC: 0 % (ref 0.0–0.2)

## 2018-08-03 LAB — MAGNESIUM: Magnesium: 1.5 mg/dL — ABNORMAL LOW (ref 1.7–2.4)

## 2018-08-03 LAB — GLUCOSE, CAPILLARY: Glucose-Capillary: 251 mg/dL — ABNORMAL HIGH (ref 70–99)

## 2018-08-03 LAB — COMPREHENSIVE METABOLIC PANEL
ALT: 12 U/L (ref 0–44)
AST: 14 U/L — ABNORMAL LOW (ref 15–41)
Albumin: 3.9 g/dL (ref 3.5–5.0)
Alkaline Phosphatase: 63 U/L (ref 38–126)
Anion gap: 14 (ref 5–15)
BUN: 6 mg/dL (ref 6–20)
CO2: 20 mmol/L — ABNORMAL LOW (ref 22–32)
Calcium: 8.8 mg/dL — ABNORMAL LOW (ref 8.9–10.3)
Chloride: 106 mmol/L (ref 98–111)
Creatinine, Ser: 1.42 mg/dL — ABNORMAL HIGH (ref 0.44–1.00)
GFR calc Af Amer: >60 mL/min (ref >60)
GFR calc non Af Amer: 53 mL/min — ABNORMAL LOW (ref >60)
Glucose, Bld: 330 mg/dL — ABNORMAL HIGH (ref 70–99)
Potassium: 3.5 mmol/L (ref 3.5–5.1)
Sodium: 140 mmol/L (ref 135–145)
Total Bilirubin: 0.4 mg/dL (ref 0.3–1.2)
Total Protein: 6.7 g/dL (ref 6.5–8.1)

## 2018-08-03 LAB — POCT I-STAT EG7
Acid-base deficit: 3 mmol/L — ABNORMAL HIGH (ref 0.0–2.0)
Bicarbonate: 23.9 mmol/L (ref 20.0–28.0)
Calcium, Ion: 1.12 mmol/L — ABNORMAL LOW (ref 1.15–1.40)
HCT: 47 % — ABNORMAL HIGH (ref 36.0–46.0)
Hemoglobin: 16 g/dL — ABNORMAL HIGH (ref 12.0–15.0)
O2 Saturation: 54 %
Potassium: 3.6 mmol/L (ref 3.5–5.1)
Sodium: 142 mmol/L (ref 135–145)
TCO2: 25 mmol/L (ref 22–32)
pCO2, Ven: 47.3 mmHg (ref 44.0–60.0)
pH, Ven: 7.312 (ref 7.250–7.430)
pO2, Ven: 32 mmHg (ref 32.0–45.0)

## 2018-08-03 LAB — RAPID URINE DRUG SCREEN, HOSP PERFORMED
Amphetamines: NOT DETECTED
Barbiturates: NOT DETECTED
Benzodiazepines: NOT DETECTED
Cocaine: NOT DETECTED
Opiates: POSITIVE — AB
Tetrahydrocannabinol: NOT DETECTED

## 2018-08-03 LAB — CBG MONITORING, ED: Glucose-Capillary: 311 mg/dL — ABNORMAL HIGH (ref 70–99)

## 2018-08-03 LAB — URINALYSIS, ROUTINE W REFLEX MICROSCOPIC
Bacteria, UA: NONE SEEN
Bilirubin Urine: NEGATIVE
Glucose, UA: >=500 mg/dL — AB
Hgb urine dipstick: NEGATIVE
Ketones, ur: 20 mg/dL — AB
Leukocytes,Ua: NEGATIVE
Nitrite: NEGATIVE
Protein, ur: NEGATIVE mg/dL
Specific Gravity, Urine: 1.017 (ref 1.005–1.030)
pH: 7 (ref 5.0–8.0)

## 2018-08-03 LAB — CK: Total CK: 41 U/L (ref 38–234)

## 2018-08-03 LAB — I-STAT BETA HCG BLOOD, ED (MC, WL, AP ONLY): I-stat hCG, quantitative: <5 m[IU]/mL (ref <5)

## 2018-08-03 LAB — SARS CORONAVIRUS 2 BY RT PCR (HOSPITAL ORDER, PERFORMED IN ~~LOC~~ HOSPITAL LAB): SARS Coronavirus 2: NEGATIVE

## 2018-08-03 MED ORDER — HYDROCODONE-ACETAMINOPHEN 5-325 MG PO TABS
1.0000 | ORAL_TABLET | Freq: Once | ORAL | Status: AC
  Administered 2018-08-03: 1 via ORAL
  Filled 2018-08-03: qty 1

## 2018-08-03 MED ORDER — SODIUM CHLORIDE 0.9 % IV SOLN
INTRAVENOUS | Status: DC
  Administered 2018-08-03: 13:00:00 via INTRAVENOUS

## 2018-08-03 MED ORDER — SODIUM CHLORIDE 0.9 % IV SOLN
INTRAVENOUS | Status: DC
  Administered 2018-08-03 – 2018-08-05 (×5): via INTRAVENOUS

## 2018-08-03 MED ORDER — TOPIRAMATE 25 MG PO TABS
25.0000 mg | ORAL_TABLET | Freq: Two times a day (BID) | ORAL | Status: DC
  Administered 2018-08-03 – 2018-08-05 (×4): 25 mg via ORAL
  Filled 2018-08-03 (×4): qty 1

## 2018-08-03 MED ORDER — LORAZEPAM 2 MG/ML IJ SOLN
1.0000 mg | Freq: Once | INTRAMUSCULAR | Status: AC | PRN
  Administered 2018-08-03: 07:00:00 1 mg via INTRAVENOUS
  Filled 2018-08-03: qty 1

## 2018-08-03 MED ORDER — ENOXAPARIN SODIUM 40 MG/0.4ML ~~LOC~~ SOLN
40.0000 mg | SUBCUTANEOUS | Status: DC
  Administered 2018-08-03 – 2018-08-04 (×2): 40 mg via SUBCUTANEOUS
  Filled 2018-08-03 (×2): qty 0.4

## 2018-08-03 MED ORDER — QUETIAPINE FUMARATE 50 MG PO TABS
400.0000 mg | ORAL_TABLET | Freq: Every day | ORAL | Status: DC
  Administered 2018-08-03 – 2018-08-04 (×2): 400 mg via ORAL
  Filled 2018-08-03 (×2): qty 8

## 2018-08-03 MED ORDER — LEVETIRACETAM IN NACL 1000 MG/100ML IV SOLN
1000.0000 mg | Freq: Once | INTRAVENOUS | Status: AC
  Administered 2018-08-03: 09:00:00 1000 mg via INTRAVENOUS
  Filled 2018-08-03: qty 100

## 2018-08-03 MED ORDER — INSULIN GLARGINE 100 UNIT/ML ~~LOC~~ SOLN
20.0000 [IU] | Freq: Two times a day (BID) | SUBCUTANEOUS | Status: DC
  Administered 2018-08-03 – 2018-08-05 (×4): 20 [IU] via SUBCUTANEOUS
  Filled 2018-08-03 (×5): qty 0.2

## 2018-08-03 MED ORDER — LEVETIRACETAM 500 MG PO TABS: 1000.0000 mg | ORAL_TABLET | Freq: Two times a day (BID) | ORAL | 0 refills | Status: DC

## 2018-08-03 MED ORDER — POTASSIUM CHLORIDE CRYS ER 20 MEQ PO TBCR
40.0000 meq | EXTENDED_RELEASE_TABLET | Freq: Once | ORAL | Status: AC
  Administered 2018-08-03: 40 meq via ORAL
  Filled 2018-08-03: qty 2

## 2018-08-03 MED ORDER — SODIUM CHLORIDE 0.9 % IV BOLUS
1000.0000 mL | Freq: Once | INTRAVENOUS | Status: AC
  Administered 2018-08-03: 1000 mL via INTRAVENOUS

## 2018-08-03 MED ORDER — LEVETIRACETAM 500 MG PO TABS
1000.0000 mg | ORAL_TABLET | Freq: Two times a day (BID) | ORAL | Status: DC
  Administered 2018-08-03 – 2018-08-05 (×4): 1000 mg via ORAL
  Filled 2018-08-03 (×4): qty 2

## 2018-08-03 MED ORDER — DICLOFENAC SODIUM 1 % TD GEL
2.0000 g | Freq: Three times a day (TID) | TRANSDERMAL | Status: DC | PRN
  Administered 2018-08-03: 2 g via TOPICAL
  Filled 2018-08-03: qty 100

## 2018-08-03 MED ORDER — INSULIN ASPART 100 UNIT/ML ~~LOC~~ SOLN
10.0000 [IU] | Freq: Once | SUBCUTANEOUS | Status: AC
  Administered 2018-08-03: 10 [IU] via SUBCUTANEOUS

## 2018-08-03 MED ORDER — INSULIN ASPART 100 UNIT/ML ~~LOC~~ SOLN
0.0000 [IU] | Freq: Three times a day (TID) | SUBCUTANEOUS | Status: DC
  Administered 2018-08-04: 5 [IU] via SUBCUTANEOUS
  Administered 2018-08-04: 3 [IU] via SUBCUTANEOUS
  Administered 2018-08-05: 5 [IU] via SUBCUTANEOUS
  Administered 2018-08-05: 13:00:00 2 [IU] via SUBCUTANEOUS

## 2018-08-03 MED ORDER — ATORVASTATIN CALCIUM 10 MG PO TABS
20.0000 mg | ORAL_TABLET | Freq: Every day | ORAL | Status: DC
  Administered 2018-08-03 – 2018-08-05 (×3): 20 mg via ORAL
  Filled 2018-08-03 (×3): qty 2

## 2018-08-03 MED ORDER — PROPRANOLOL HCL 20 MG PO TABS
20.0000 mg | ORAL_TABLET | Freq: Two times a day (BID) | ORAL | Status: DC
  Administered 2018-08-04 – 2018-08-05 (×3): 20 mg via ORAL
  Filled 2018-08-03 (×5): qty 1

## 2018-08-03 MED ORDER — GABAPENTIN 300 MG PO CAPS
300.0000 mg | ORAL_CAPSULE | Freq: Every day | ORAL | Status: DC
  Administered 2018-08-03 – 2018-08-04 (×2): 300 mg via ORAL
  Filled 2018-08-03 (×2): qty 1

## 2018-08-03 NOTE — ED Notes (Signed)
Patient transported to CT 

## 2018-08-03 NOTE — ED Notes (Signed)
It took four staff members to complete an in and out on pt.  A tampon was removed that appeared to be in for an extended period of time.

## 2018-08-03 NOTE — ED Notes (Signed)
Iv attempted x2 unsucessful, placed IV team consult

## 2018-08-03 NOTE — ED Notes (Signed)
ED TO INPATIENT HANDOFF REPORT  ED Nurse Name and Phone #: Leatha Gilding [phone]  S Name/Age/Gender Nancy [name] 18 y.o. female Room/Bed: 045C/045C  Code Status   Code Status: Prior  Home/SNF/Other Home Patient oriented to: self, person, place, and time Is this baseline? Yes   Triage Complete: Triage complete  Chief Complaint sz  Triage Note Pt endorses having multiple seizures today, has hx and takes keppra. Has hx of DM and CHF. Tachy, axox4, slightly lethargic.    Allergies Allergies  Allergen Reactions  . Ibuprofen Other (See Comments)    GI MD said to not take this anymore    Level of Care/Admitting Diagnosis ED Disposition    ED Disposition Condition Comment   Admit  Hospital Area: [facility] [[id number]]  Level of Care: Telemetry Medical [104]  I expect the patient will be discharged within 24 hours: Yes  LOW acuity---Tx typically complete <24 hrs---ACUTE conditions typically can be evaluated <24 hours---LABS likely to return to acceptable levels <24 hours---IS near functional baseline---EXPECTED to return to current living arrangement---NOT newly hypoxic: Does not meet criteria for 5C-Observation unit  Covid Evaluation: Asymptomatic Screening Protocol (No Symptoms)  Diagnosis: Seizure [facility]) [[id number]]  Admitting Physician: Pieter Partridge [[id number]]  Attending Physician: Pieter Partridge [[id number]]  PT Class (Do Not Modify): Observation [104]  PT Acc Code (Do Not Modify): Observation [10022]       B Medical/Surgery History Past Medical History:  Diagnosis Date  . Acanthosis nigricans   . Anxiety   . CHF (congestive heart failure) ([facility])   . Chronic lower back pain   . Depression   . Dyspepsia   . Obesity   . Ovarian cyst    pt is not aware of this hx (11/24/2017)  . Pre-diabetes   . 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 Premature baby   . Seizures ([facility])   . Type II diabetes mellitus ([facility])    insulin dependant   Past  Surgical History:  Procedure Laterality Date  . ABDOMINAL HERNIA REPAIR     "I was a baby"  . HERNIA REPAIR    . TONSILLECTOMY AND ADENOIDECTOMY    . WISDOM TOOTH EXTRACTION  2017     A IV Location/Drains/Wounds Patient Lines/Drains/Airways Status   Active Line/Drains/Airways    Name:   Placement date:   Placement time:   Site:   Days:   Peripheral IV 08/03/18 Right Antecubital   08/03/18    1252    Antecubital   less than 1          Intake/Output Last 24 hours No intake or output data in the 24 hours ending 08/03/18 1842  Labs/Imaging Results for orders placed or performed during the hospital encounter of 08/03/18 (from the past 48 hour(s))  Basic metabolic panel     Status: Abnormal   Collection Time: 08/03/18 12:35 PM  Result Value Ref Range   Sodium 141 135 - 145 mmol/L   Potassium 3.4 (L) 3.5 - 5.1 mmol/L   Chloride 108 98 - 111 mmol/L   CO2 21 (L) 22 - 32 mmol/L   Glucose, Bld 345 (H) 70 - 99 mg/dL   BUN 6 6 - 20 mg/dL   Creatinine, Ser 5.78 (H) 0.44 - 1.00 mg/dL   Calcium 8.5 (L) 8.9 - 10.3 mg/dL   GFR calc non Af Amer 39 (L) >60 mL/min   GFR calc Af Amer 46 (L) >60 mL/min   Anion gap 12 5 - 15  Comment: Performed at [facility], [place] [address], [place], [place] [id number]  CK     Status: None   Collection Time: 08/03/18  5:20 PM  Result Value Ref Range   Total CK 41 38 - 234 U/L    Comment: Performed at [facility], [place] [address], [place], [place] [address]   Ct Head Wo Contrast  Result Date: 08/03/2018 CLINICAL DATA:  Ct head wo, Pt endorses having multiple seizures today, has hx and takes keppra. Has hx of DM and CHF. Tachy, axox4, slightly lethargic. EXAM: CT HEAD WITHOUT CONTRAST TECHNIQUE: Contiguous axial images were obtained from the base of the skull through the vertex without intravenous contrast. COMPARISON:  06/20/2018 FINDINGS: Brain: No evidence of acute infarction, hemorrhage, hydrocephalus, extra-axial collection or mass  lesion/mass effect. Vascular: No hyperdense vessel or unexpected calcification. Skull: Normal. Negative for fracture or focal lesion. Sinuses/Orbits: Normal globes and orbits.  Clear sinuses. Other: None. IMPRESSION: Normal unenhanced CT scan of the brain. Electronically Signed   By: Lajean Manes M.D.   On: 08/03/2018 16:19   Dg Chest Portable 1 View  Result Date: 08/03/2018 CLINICAL DATA:  Screening exam EXAM: PORTABLE CHEST 1 VIEW COMPARISON:  Six days ago FINDINGS: Low volume chest with rotation. Normal heart size and mediastinal contours. No acute infiltrate or edema. No effusion or pneumothorax. No acute osseous findings. IMPRESSION: Negative low volume chest. Electronically Signed   By: Monte Fantasia M.D.   On: 08/03/2018 05:27    Pending Labs Unresulted Labs (From admission, onward)    Start     Ordered   Signed and Held  CBC  (enoxaparin (LOVENOX)    CrCl >/= 30 ml/min)  Once,   R    Comments: Baseline for enoxaparin therapy IF NOT ALREADY DRAWN.  Notify MD if PLT < 100 K.    Signed and Held   Signed and Held  Creatinine, serum  (enoxaparin (LOVENOX)    CrCl >/= 30 ml/min)  Once,   R    Comments: Baseline for enoxaparin therapy IF NOT ALREADY DRAWN.    Signed and Held   Signed and Held  Creatinine, serum  (enoxaparin (LOVENOX)    CrCl >/= 30 ml/min)  Weekly,   R    Comments: while on enoxaparin therapy    Signed and Held   Signed and Held  Basic metabolic panel  Tomorrow morning,   R     Signed and Held   Signed and Held  CBC  Tomorrow morning,   R     Signed and Held   Signed and Held  Magnesium  Once,   R     Signed and Held          Vitals/Pain Today's Vitals   08/03/18 1642 08/03/18 1700 08/03/18 1728 08/03/18 1815  BP:  124/76 117/86 117/76  Pulse:  97 97 97  Resp:  18 (!) 21 (!) 24  Temp: 98.5 F (36.9 C)     TempSrc: Oral     SpO2:  100% 100% 100%    Isolation Precautions No active isolations  Medications Medications  0.9 %  sodium chloride infusion  ( Intravenous New Bag/Given 08/03/18 1252)  sodium chloride 0.9 % bolus 1,000 mL (0 mLs Intravenous Stopped 08/03/18 1633)  insulin aspart (novoLOG) injection 10 Units (10 Units Subcutaneous Given 08/03/18 1416)  potassium chloride SA (K-DUR) CR tablet 40 mEq (40 mEq Oral Given 08/03/18 1631)    Mobility walks Low fall  risk   Focused Assessments Neuro Assessment Handoff:  Swallow screen pass?      Last date known well: 08/03/18   Neuro Assessment:   Neuro Checks:      Last Documented NIHSS Modified Score:   Has TPA been given? No If patient is a Neuro Trauma and patient is going to OR before floor call report to 4N Charge nurse: [phone] or [phone]     R Recommendations: See Admitting Provider Note  Report given to:   Additional Notes:

## 2018-08-03 NOTE — ED Notes (Signed)
Diet tray ordered 

## 2018-08-03 NOTE — ED Provider Notes (Signed)
[facility] EMERGENCY DEPARTMENT Provider Note   CSN: [id number] Arrival date & time: 08/03/18  0431     History   Chief Complaint Chief Complaint  Patient presents with  . Seizures    HPI Nancy [name] is a 18 y.o. female.     Patient with past medical history significant for insulin-dependent diabetes mellitus, seizures, CHF, chronic pain, and depression with anxiety presents to the ED with a chief complaint of seizures.  Per EMS, family found patient on floor of house.  Suspected seizure.  Hx of the same.  Reportedly takes keppra, but unclear about compliance.  Level 5 caveat applies 2/2 mental status.  The history is provided by the patient. No language interpreter was used.  Seizures   Past Medical History:  Diagnosis Date  . Acanthosis nigricans   . Anxiety   . CHF (congestive heart failure) ([place])   . Chronic lower back pain   . Depression   . Dyspepsia   . Obesity   . Ovarian cyst    pt is not aware of this hx (11/24/2017)  . Pre-diabetes   . 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 Premature baby   . Seizures ([place])   . Type II diabetes mellitus ([facility])    insulin dependant    Patient Active Problem List   Diagnosis Date Noted  . Depression with anxiety 07/25/2018  . DKA (diabetic ketoacidoses) ([place]) 07/25/2018  . Prolonged QT interval 07/25/2018  . Seizures ([place]) 06/14/2018  . Syncope 01/30/2018  . Orthostatic hypotension 01/24/2018  . DKA (diabetic ketoacidosis) ([place]) 01/24/2018  . Chronic abdominal pain 12/24/2017  . Sinus tachycardia by electrocardiogram   . Acute lower UTI   . Uncontrolled type 2 diabetes mellitus with hyperglycemia ([place])   . Chest pain 12/19/2017  . Generalized abdominal pain 08/21/2017  . Non compliance with medical treatment 01/27/2012  . Adjustment disorder 09/16/2011  . DM (diabetes mellitus), type 1 ([place]) 09/15/2011  . Dyspepsia   . Acanthosis nigricans   . Goiter   . Obesity 06/14/2010  . Hypertension  06/14/2010    Past Surgical History:  Procedure Laterality Date  . ABDOMINAL HERNIA REPAIR     "I was a baby"  . HERNIA REPAIR    . TONSILLECTOMY AND ADENOIDECTOMY    . WISDOM TOOTH EXTRACTION  2017     OB History   No obstetric history on file.      Home Medications    Prior to Admission medications   Medication Sig Start Date End Date Taking? Authorizing Provider  atorvastatin (LIPITOR) 20 MG tablet Take 1 tablet (20 mg total) by mouth daily. 07/26/18 10/24/18  Mercy Riding, MD  Blood Glucose Monitoring Suppl (TRUE METRIX METER) w/Device KIT Use as instructed. Monitor blood glucose levels twice per day 12/02/17   [name], NP  diclofenac sodium (VOLTAREN) 1 % GEL Apply 2 g topically 3 (three) times daily as needed (as directed to painful sites).     [provider]  FLUoxetine (PROZAC) 20 MG capsule Take 1 capsule (20 mg total) by mouth at bedtime. Please allow patient to pick up 30 day supply for free 07/13/18 08/12/18  [name], NP  gabapentin (NEURONTIN) 300 MG capsule Take 1 capsule (300 mg total) by mouth at bedtime for 30 days. 06/02/18 07/05/27  [name], NP  glucagon 1 MG injection Use for Severe Hypoglycemia . Inject 1 mg intramuscularly if unresponsive, unable to swallow, unconscious and/or has seizure  Patient taking differently: Inject 1 mg into the muscle once as needed (for severe hypoglycemia- if unresponsive, unable to swallow, unconscious, and/or has had seizure(s)).  07/20/13 12/19/25  Renato Shin, MD  glucose blood (TRUE METRIX BLOOD GLUCOSE TEST) test strip Use as instructed 02/15/18   [name], NP  insulin glargine (LANTUS) 100 UNIT/ML injection Inject 0.2 mLs (20 Units total) into the skin 2 (two) times daily. 07/26/18   Mercy Riding, MD  insulin lispro (HUMALOG) 100 UNIT/ML injection Inject 0-0.15 mLs (0-15 Units total) into the skin 4 (four) times daily -  with meals and at bedtime. Patient taking differently: Inject 0-15  Units into the skin 4 (four) times daily -  with meals and at bedtime. Sliding scale 07/26/18 08/25/18  Mercy Riding, MD  Insulin Pen Needle (B-D UF III MINI PEN NEEDLES) 31G X 5 MM MISC Use as instructed. Monitor blood glucose levels twice per day 02/15/18   [name], NP  levETIRAcetam (KEPPRA) 500 MG tablet Take 1 tablet (500 mg total) by mouth 2 (two) times daily for 30 days. Patient taking differently: Take 1,000 mg by mouth 2 (two) times daily.  06/15/18 07/29/27  Little Ishikawa, MD  meloxicam (MOBIC) 7.5 MG tablet Take 7.5 mg by mouth daily.    [provider]  promethazine (PHENERGAN) 25 MG tablet Take 1 tablet (25 mg total) by mouth every 8 (eight) hours as needed for nausea or vomiting. 07/29/18   Ripley Fraise, MD  propranolol (INDERAL) 20 MG tablet Take 1 tablet (20 mg total) by mouth 2 (two) times daily. Please allow patient to pick up 30 day supply for free 07/13/18 08/12/18  [name], NP  QUEtiapine (SEROQUEL) 100 MG tablet Take 400 mg by mouth at bedtime.     [provider]  topiramate (TOPAMAX) 25 MG tablet Take 1 tablet (25 mg total) by mouth 2 (two) times daily. Please allow patient to pick up 30 day supply for free 07/13/18 08/12/18  [name], NP  TRUEPLUS LANCETS 28G MISC Use as instructed. Monitor blood glucose levels twice per day 12/02/17   [name], NP    Family History Family History  Problem Relation Age of Onset  . Diabetes Mother   . Hypertension Mother   . Obesity Mother   . Asthma Mother   . Allergic rhinitis Mother   . Eczema Mother   . Diabetes Father   . Hypertension Father   . Obesity Father   . Hyperlipidemia Father   . Hypertension Paternal Aunt   . Hypertension Maternal Grandfather   . Colon cancer Maternal Grandfather   . Diabetes Paternal Grandmother   . Obesity Paternal Grandmother   . Diabetes Paternal Grandfather   . Obesity Paternal Grandfather   . Angioedema Neg Hx   . Immunodeficiency Neg Hx    . Urticaria Neg Hx   . Stomach cancer Neg Hx   . Esophageal cancer Neg Hx     Social History Social History   Tobacco Use  . Smoking status: Never Smoker  . Smokeless tobacco: Never Used  Substance Use Topics  . Alcohol use: No    Alcohol/week: 0.0 standard drinks  . Drug use: No     Allergies   Ibuprofen   Review of Systems Review of Systems  Neurological: Positive for seizures.  All other systems reviewed and are negative.    Physical Exam Updated Vital Signs Pulse (!) 122   Temp (!) 96.8  F (36 C) (Axillary)   LMP 07/06/2018 (Approximate)   SpO2 100%   Physical Exam Vitals signs and nursing note reviewed.  Constitutional:      General: She is not in acute distress.    Appearance: She is well-developed.  HENT:     Head: Normocephalic and atraumatic.  Eyes:     Conjunctiva/sclera: Conjunctivae normal.  Neck:     Musculoskeletal: Neck supple.  Cardiovascular:     Rate and Rhythm: Normal rate and regular rhythm.     Heart sounds: No murmur.  Pulmonary:     Effort: Pulmonary effort is normal. No respiratory distress.     Breath sounds: Normal breath sounds.  Abdominal:     Palpations: Abdomen is soft.     Tenderness: There is no abdominal tenderness.  Skin:    General: Skin is warm and dry.  Neurological:     Comments: GCS 7 (E1V2M4), possibly postictal  Psychiatric:     Comments: Unable to assess      ED Treatments / Results  Labs (all labs ordered are listed, but only abnormal results are displayed) Labs Reviewed  SARS CORONAVIRUS 2 (HOSPITAL ORDER, [facility])  CBC WITH DIFFERENTIAL/PLATELET  COMPREHENSIVE METABOLIC PANEL  BLOOD GAS, VENOUS  URINALYSIS, ROUTINE W REFLEX MICROSCOPIC  I-STAT BETA HCG BLOOD, ED ([facility], [facility], [facility] ONLY)    EKG None  Radiology No results found.  Procedures Procedures (including critical care time)  Medications Ordered in ED Medications  sodium chloride 0.9 % bolus 1,000 mL (has  no administration in time range)  LORazepam (ATIVAN) injection 1 mg (has no administration in time range)     Initial Impression / Assessment and Plan / ED Course  I have reviewed the triage vital signs and the nursing notes.  Pertinent labs & imaging results that were available during my care of the patient were reviewed by me and considered in my medical decision making (see chart for details).        Patient with poorly controlled DM, hx of DKA.  Recent admission for syncope vs seizure.  Had neurology consult and was started on Keppra.  Questionable compliance.  Had another seizure tonight.  Postictal on arrival.  Labs thus far reassuring.  No evidence of DKA.  Will give loading dose of keppra.  Will continue to monitor and ensure that she returns baseline.    Patient signed out to oncoming team, Dr. Juliane Poot.   Final Clinical Impressions(s) / ED Diagnoses   Final diagnoses:  None    ED Discharge Orders    None       Montine Circle, PA-C 08/03/18 0645    Mesner, Corene Cornea, MD 08/06/18 [phone]

## 2018-08-03 NOTE — H&P (Signed)
History and Physical:    Nancy [name]   BVQ:[id number] DOB: 2000-06-27 DOA: 08/03/2018  Referring MD/provider: Dr Ashok Cordia PCP: [name], NP   Patient coming from: Home  Chief Complaint: recurrent seizures   History of Present Illness:   Nancy [name] is an 18 y.o. female with past medical history significant for known seizure disorder, type 1 diabetes with multiple admissions for DKA, medical noncompliance, hypertension, HFr EF, depression and chronic pain who presents with recurrent seizures.  Patient was seen earlier today with a seizure is brought in by her mother.  Extensive work-up was negative for any acute causes of seizures.  Patient is known to have recurrent seizures at home.  She was loaded with Keppra and given another prescription for Keppra and discharged home.  However on the way home patient apparently had another seizure and was brought back to the ED.  Per my history patient states that she has multiple seizures a week.  Notes that she often has 1-2 seizures a day.  Patient states that she is compliant with her medications.  She has no acute complaints and states that she does not remember her seizures earlier today.  She states that she has an outpatient neurology appointment for initial evaluation in the first week of August.  ED Course:  The patient was noted to be awake and alert without evidence of a postictal state.  She had no acute complaints.  She is admitted for recurrent seizures.  ROS:   ROS   Review of Systems: General: No fever, chills, weight changes Skin: No rashes, lesions, wounds Respiratory: No cough,, shortness of breath, hemoptysis Cardiovascular: No palpitations, chest pain GI: No  diarrhea, constipation.  Patient has chronic nausea. 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[place])    Premature baby    Seizures ([facility])    Type II diabetes mellitus ([place])    insulin dependant    Past Surgical History:   Past Surgical History:  Procedure Laterality Date   ABDOMINAL HERNIA REPAIR     "I was a baby"   HERNIA REPAIR     TONSILLECTOMY AND ADENOIDECTOMY     WISDOM TOOTH EXTRACTION  2017    Social History:   Social History   Socioeconomic History   Marital status: Single    Spouse name: Not on file   Number of children: 0   Years of education: Not on file   Highest education level: Not on file  Occupational History   Occupation: Admission  Social Needs   Financial resource strain: Not on file   Food insecurity    Worry: Not on file    Inability: Not on file   Transportation needs    Medical: Not on file    Non-medical: Not on file  Tobacco Use   Smoking status: Never Smoker   Smokeless tobacco: Never Used  Substance and Sexual Activity   Alcohol use: No    Alcohol/week: 0.0 standard drinks   Drug use: No  Sexual activity: Never  Lifestyle   Physical activity    Days per week: Not on file    Minutes per session: Not on file   Stress: Not on file  Relationships   Social connections    Talks on phone: Not on file    Gets together: Not on file    Attends religious service: Not on file    Active member of club or organization: Not on file    Attends meetings of clubs or organizations: Not on file    Relationship status: Not on file   Intimate partner violence    Fear of current or ex partner: Not on file    Emotionally abused: Not on file    Physically abused: Not on file    Forced sexual activity: Not on file  Other Topics Concern   Not on file  Social History  Narrative   Lives with mom and mom's girlfriend. Dad involved. 9th grade [employer].    Allergies   Ibuprofen  Family history:   Family History  Problem Relation Age of Onset   Diabetes Mother    Hypertension Mother    Obesity Mother    Asthma Mother    Allergic rhinitis Mother    Eczema Mother    Diabetes Father    Hypertension Father    Obesity Father    Hyperlipidemia Father    Hypertension Paternal Aunt    Hypertension Maternal Grandfather    Colon cancer Maternal Grandfather    Diabetes Paternal Grandmother    Obesity Paternal Grandmother    Diabetes Paternal Grandfather    Obesity Paternal Grandfather    Angioedema Neg Hx    Immunodeficiency Neg Hx    Urticaria Neg Hx    Stomach cancer Neg Hx    Esophageal cancer Neg Hx     Current Medications:   Prior to Admission medications   Medication Sig Start Date End Date Taking? Authorizing Provider  atorvastatin (LIPITOR) 20 MG tablet Take 1 tablet (20 mg total) by mouth daily. 07/26/18 10/24/18  Mercy Riding, MD  Blood Glucose Monitoring Suppl (TRUE METRIX METER) w/Device KIT Use as instructed. Monitor blood glucose levels twice per day 12/02/17   [name], NP  diclofenac sodium (VOLTAREN) 1 % GEL Apply 2 g topically 3 (three) times daily as needed (as directed to painful sites).     [provider]  FLUoxetine (PROZAC) 20 MG capsule Take 1 capsule (20 mg total) by mouth at bedtime. Please allow patient to pick up 30 day supply for free 07/13/18 08/12/18  [name], NP  gabapentin (NEURONTIN) 300 MG capsule Take 1 capsule (300 mg total) by mouth at bedtime for 30 days. 06/02/18 07/05/27  [name], NP  glucagon 1 MG injection Use for Severe Hypoglycemia . Inject 1 mg intramuscularly if unresponsive, unable to swallow, unconscious and/or has seizure Patient taking differently: Inject 1 mg into the muscle once as needed (for severe hypoglycemia- if unresponsive,  unable to swallow, unconscious, and/or has had seizure(s)).  07/20/13 12/19/25  Renato Shin, MD  glucose blood (TRUE METRIX BLOOD GLUCOSE TEST) test strip Use as instructed 02/15/18   [name], NP  insulin glargine (LANTUS) 100 UNIT/ML injection Inject 0.2 mLs (20 Units total) into the skin 2 (two) times daily. 07/26/18   Mercy Riding, MD  insulin lispro (HUMALOG) 100 UNIT/ML injection Inject 0-0.15 mLs (0-15 Units total) into the skin 4 (four) times daily -  with  meals and at bedtime. Patient taking differently: Inject 0-15 Units into the skin 4 (four) times daily -  with meals and at bedtime. Sliding scale 07/26/18 08/25/18  Mercy Riding, MD  Insulin Pen Needle (B-D UF III MINI PEN NEEDLES) 31G X 5 MM MISC Use as instructed. Monitor blood glucose levels twice per day 02/15/18   [name], NP  levETIRAcetam (KEPPRA) 500 MG tablet Take 2 tablets (1,000 mg total) by mouth 2 (two) times daily. 08/03/18 09/02/18  Curatolo, Adam, DO  meloxicam (MOBIC) 7.5 MG tablet Take 7.5 mg by mouth daily.    [provider]  promethazine (PHENERGAN) 25 MG tablet Take 1 tablet (25 mg total) by mouth every 8 (eight) hours as needed for nausea or vomiting. 07/29/18   Ripley Fraise, MD  propranolol (INDERAL) 20 MG tablet Take 1 tablet (20 mg total) by mouth 2 (two) times daily. Please allow patient to pick up 30 day supply for free 07/13/18 08/12/18  [name], NP  QUEtiapine (SEROQUEL) 100 MG tablet Take 400 mg by mouth at bedtime.     [provider]  topiramate (TOPAMAX) 25 MG tablet Take 1 tablet (25 mg total) by mouth 2 (two) times daily. Please allow patient to pick up 30 day supply for free 07/13/18 08/12/18  [name], NP  TRUEPLUS LANCETS 28G MISC Use as instructed. Monitor blood glucose levels twice per day 12/02/17   [name], NP    Physical Exam:   Vitals:   08/03/18 1637 08/03/18 1642 08/03/18 1700 08/03/18 1728  BP:   124/76 117/86  Pulse: 97  97 97  Resp:  (!) 26  18 (!) 21  Temp:  98.5 F (36.9 C)    TempSrc:  Oral    SpO2: 100%  100% 100%     Physical Exam: Blood pressure 117/86, pulse 97, temperature 98.5 F (36.9 C), temperature source Oral, resp. rate (!) 21, last menstrual period 07/06/2018, SpO2 100 %. Gen: Patient sleeping comfortably on her side in no acute distress. Eyes: Sclerae anicteric. Conjunctiva mildly injected. Chest: Moderately good air entry bilaterally with no adventitious sounds.  CV: Distant, regular, no audible murmurs. Abdomen: NABS, soft, nondistended, nontender. No tenderness to light or deep palpation. No rebound, no guarding. Extremities: No edema.  Skin: Warm and dry. No rashes, lesions or wounds. Neuro: Alert and oriented times 3; grossly nonfocal.   Data Review:    Labs: Basic Metabolic Panel: Recent Labs  Lab 07/29/18 0004 08/03/18 0511 08/03/18 0608 08/03/18 1235  NA 135 140 142 141  K 3.4* 3.5 3.6 3.4*  CL 103 106  --  108  CO2 21* 20*  --  21*  GLUCOSE 422* 330*  --  345*  BUN <5* 6  --  6  CREATININE 0.62 1.42*  --  1.82*  CALCIUM 8.6* 8.8*  --  8.5*   Liver Function Tests: Recent Labs  Lab 08/03/18 0511  AST 14*  ALT 12  ALKPHOS 63  BILITOT 0.4  PROT 6.7  ALBUMIN 3.9   No results for input(s): LIPASE, AMYLASE in the last 168 hours. No results for input(s): AMMONIA in the last 168 hours. CBC: Recent Labs  Lab 07/29/18 0004 08/03/18 0511 08/03/18 0608  WBC 9.5 9.9  --   NEUTROABS 6.6 5.4  --   HGB 12.0 13.8 16.0*  HCT 37.1 42.2 47.0*  MCV 86.7 87.0  --   PLT 257 338  --    Cardiac Enzymes:  No results for input(s): CKTOTAL, CKMB, CKMBINDEX, TROPONINI in the last 168 hours.  BNP (last 3 results) No results for input(s): PROBNP in the last 8760 hours. CBG: Recent Labs  Lab 07/28/18 2241 07/29/18 0311 08/03/18 0456  GLUCAP 425* 221* 311*    Urinalysis    Component Value Date/Time   COLORURINE STRAW (A) 08/03/2018 0455   APPEARANCEUR CLEAR 08/03/2018  0455   LABSPEC 1.017 08/03/2018 0455   PHURINE 7.0 08/03/2018 0455   GLUCOSEU >=500 (A) 08/03/2018 0455   HGBUR NEGATIVE 08/03/2018 0455   BILIRUBINUR NEGATIVE 08/03/2018 0455   BILIRUBINUR negative 07/13/2018 1651   KETONESUR 20 (A) 08/03/2018 0455   PROTEINUR NEGATIVE 08/03/2018 0455   UROBILINOGEN 0.2 07/13/2018 1651   UROBILINOGEN 1.0 07/09/2017 1324   NITRITE NEGATIVE 08/03/2018 0455   LEUKOCYTESUR NEGATIVE 08/03/2018 0455      Radiographic Studies: Ct Head Wo Contrast  Result Date: 08/03/2018 CLINICAL DATA:  Ct head wo, Pt endorses having multiple seizures today, has hx and takes keppra. Has hx of DM and CHF. Tachy, axox4, slightly lethargic. EXAM: CT HEAD WITHOUT CONTRAST TECHNIQUE: Contiguous axial images were obtained from the base of the skull through the vertex without intravenous contrast. COMPARISON:  06/20/2018 FINDINGS: Brain: No evidence of acute infarction, hemorrhage, hydrocephalus, extra-axial collection or mass lesion/mass effect. Vascular: No hyperdense vessel or unexpected calcification. Skull: Normal. Negative for fracture or focal lesion. Sinuses/Orbits: Normal globes and orbits.  Clear sinuses. Other: None. IMPRESSION: Normal unenhanced CT scan of the brain. Electronically Signed   By: Lajean Manes M.D.   On: 08/03/2018 16:19   Dg Chest Portable 1 View  Result Date: 08/03/2018 CLINICAL DATA:  Screening exam EXAM: PORTABLE CHEST 1 VIEW COMPARISON:  Six days ago FINDINGS: Low volume chest with rotation. Normal heart size and mediastinal contours. No acute infiltrate or edema. No effusion or pneumothorax. No acute osseous findings. IMPRESSION: Negative low volume chest. Electronically Signed   By: Monte Fantasia M.D.   On: 08/03/2018 05:27    EKG: Independently reviewed.  EKG from earlier today shows sinus tachycardia.  Nonspecific intraventricular conduction delay.  QTC is 504.   Assessment/Plan:   Principal Problem:   Seizures ([place]) Active Problems:    Hypertension   Non compliance with medical treatment   Uncontrolled type 2 diabetes mellitus with hyperglycemia ([place])   18 year old female with multiple medical problems and multiple admissions with known history of noncompliance presents with recurrent seizures.  SEIZURE DISORDER On Topamax and high-dose Keppra, 2000 twice daily at home, it is unclear if patient is taking her medications as prescribed.  Patient does state that she has multiple seizures a week and often daily seizures.  I am continuing patient's home medications.  Neurology consult has been called and is pending.  AKI Unclear etiology, CPK pending after multiple seizures Will hydrate gently given known systolic dysfunction Recheck in the morning  PROLONGED QTC QTC earlier today was 502, patient has had prolonged QT in the past thought to be secondary to medications and electrolyte abnormality. We will hold fluoxetine Continue quetiapine Hold Phenergan Check magnesium calcium and replete potassium  HTN Continue Inderal  HFrEF Patient with an EF of 45 to 50% per previous notes No evidence of acute decompensation at present  IDDM Continue Lantus 20 twice daily per home doses On a scale insulin moderate dose AC at bedtime  CHRONIC PAIN Continue gabapentin  DEPRESSION Continue Seroquel however will hold fluoxetine due to elevated QT     Other information:  DVT prophylaxis: Lovenox ordered. Code Status: Full code. Family Communication: Patient states her mother knows that she is here Disposition Plan: Home Consults called: Neurology Admission status: Observation  The medical decision making on this patient was of high complexity and the patient is at high risk for clinical deterioration, therefore this is a level 3 visit.   Dewaine Oats Tublu Erielle Gawronski [facility]  If 7PM-7AM, please contact night-coverage [email or web address] Password [facility] 08/03/2018, 5:43 PM

## 2018-08-03 NOTE — Discharge Instructions (Signed)
Continue taking her Keppra at home, keep close control of your blood sugars

## 2018-08-03 NOTE — ED Notes (Signed)
Attempted report 

## 2018-08-03 NOTE — ED Provider Notes (Signed)
  Physical Exam  Pulse (!) 122   Temp (!) 97.4 F (36.3 C) (Rectal)   LMP 07/06/2018 (Approximate)   SpO2 100%   Physical Exam  ED Course/Procedures   Clinical Course as of Aug 02 824  Tue Aug 03, 2018  0734 Not DKA. New AKI. Reassessed and still very somnolent. Also tachycardic. Additional fluids ordered.    [AS]    Clinical Course User Index [AS] Tillie Fantasia, MD    Procedures  MDM   Here with CC of seizure. Seemed post ictal. Hx of CHF, DBM and frequent DKA. Recent similar admission. Plan to reassess, follow up labs to confirm not in DKA, determine appropriate dispo.   10:28 AM Reassessed.  Patient has returned to baseline.  She is conversing and ambulating without difficulty.  Gave prescription for Keppra.  She has known history of seizures and no other remarkable findings on work-up with the exception of AKI.  This alone is not an indication for admission.  Counseled her regarding adequate p.o. hydration and the need to follow-up closely with PCP.  Gave prescription for Keppra.  Patient vocalized understanding and agreement with plan. Hand no other questions or concerns. Was given relevant verbal and written information regarding aftercare and return precautions and discharged in good condition.     Tillie Fantasia, MD 08/03/18 [address] [place], DO 08/03/18 [phone]

## 2018-08-03 NOTE — ED Triage Notes (Signed)
Per EMS, pt from home, family found her on the floor of the house after an unwitnessed seizure.  Unknown downtime, urinary incontinence, only response is pulling away.  No meds given, no IV, c-collar on.  There was some question about medications, family said they were in the middle of adjusting them?  Heart rate 106 107/60 CBG - 371 98% on room air.

## 2018-08-03 NOTE — ED Provider Notes (Addendum)
[facility] EMERGENCY DEPARTMENT Provider Note   CSN: [id number] Arrival date & time: 08/03/18  1043     History   Chief Complaint Chief Complaint  Patient presents with   Seizures    HPI Nancy [name] is a 18 y.o. female.     Patient w hx seizures, presents after possible seizure. Per report, pts family members was taking pt home after ED visit for seizure when had seizure while sitting. Duration not clear from report. On arrival to ED patient is awake, alert, oriented, not seizing. Pt denies headache. No neck pain or stiffness. Denies change in speech or vision. No numbness/weakness. Denies trauma or fall. No oral injury. No incontinence. Denies dysuria or gu c/o. No fever or chills. ?compliance w meds at home, but was given dose keppra while in ED earlier.   The history is provided by the patient.  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[place])    Premature baby    Seizures ([place])    Type II diabetes mellitus ([place])    insulin dependant    Patient Active Problem List   Diagnosis Date Noted   Depression with anxiety 07/25/2018   DKA (diabetic ketoacidoses) ([place]) 07/25/2018   Prolonged QT interval 07/25/2018   Seizures ([place]) 06/14/2018   Syncope 01/30/2018   Orthostatic hypotension 01/24/2018   DKA (diabetic ketoacidosis) ([place]) 01/24/2018   Chronic abdominal pain 12/24/2017   Sinus tachycardia by electrocardiogram    Acute lower UTI    Uncontrolled type 2 diabetes mellitus with hyperglycemia ([place])    Chest pain 12/19/2017   Generalized abdominal pain 08/21/2017   Non compliance with medical treatment 01/27/2012   Adjustment disorder 09/16/2011   DM (diabetes  mellitus), type 1 ([place]) 09/15/2011   Dyspepsia    Acanthosis nigricans    Goiter    Obesity 06/14/2010   Hypertension 06/14/2010    Past Surgical History:  Procedure Laterality Date   ABDOMINAL HERNIA REPAIR     "I was a baby"   HERNIA REPAIR     TONSILLECTOMY AND ADENOIDECTOMY     WISDOM TOOTH EXTRACTION  2017     OB History   No obstetric history on file.      Home Medications    Prior to Admission medications   Medication Sig Start Date End Date Taking? Authorizing Provider  atorvastatin (LIPITOR) 20 MG tablet Take 1 tablet (20 mg total) by mouth daily. 07/26/18 10/24/18  Mercy Riding, MD  Blood Glucose Monitoring Suppl (TRUE METRIX METER) w/Device KIT Use as instructed. Monitor blood glucose levels twice per day 12/02/17   [name], NP  diclofenac sodium (VOLTAREN) 1 % GEL Apply 2 g topically 3 (three) times daily as needed (as directed to painful sites).     [provider]  FLUoxetine (PROZAC) 20 MG capsule Take 1 capsule (20 mg total) by mouth at bedtime. Please allow patient to pick up 30 day supply for free 07/13/18 08/12/18  [name], NP  gabapentin (NEURONTIN) 300 MG capsule Take 1 capsule (300 mg total) by mouth at bedtime for 30 days. 06/02/18 07/05/27  Geryl Rankins  W, NP  glucagon 1 MG injection Use for Severe Hypoglycemia . Inject 1 mg intramuscularly if unresponsive, unable to swallow, unconscious and/or has seizure Patient taking differently: Inject 1 mg into the muscle once as needed (for severe hypoglycemia- if unresponsive, unable to swallow, unconscious, and/or has had seizure(s)).  07/20/13 12/19/25  Renato Shin, MD  glucose blood (TRUE METRIX BLOOD GLUCOSE TEST) test strip Use as instructed 02/15/18   [name], NP  insulin glargine (LANTUS) 100 UNIT/ML injection Inject 0.2 mLs (20 Units total) into the skin 2 (two) times daily. 07/26/18   Mercy Riding, MD  insulin lispro (HUMALOG) 100 UNIT/ML injection Inject 0-0.15 mLs  (0-15 Units total) into the skin 4 (four) times daily -  with meals and at bedtime. Patient taking differently: Inject 0-15 Units into the skin 4 (four) times daily -  with meals and at bedtime. Sliding scale 07/26/18 08/25/18  Mercy Riding, MD  Insulin Pen Needle (B-D UF III MINI PEN NEEDLES) 31G X 5 MM MISC Use as instructed. Monitor blood glucose levels twice per day 02/15/18   [name], NP  levETIRAcetam (KEPPRA) 500 MG tablet Take 2 tablets (1,000 mg total) by mouth 2 (two) times daily. 08/03/18 09/02/18  Curatolo, Adam, DO  meloxicam (MOBIC) 7.5 MG tablet Take 7.5 mg by mouth daily.    [provider]  promethazine (PHENERGAN) 25 MG tablet Take 1 tablet (25 mg total) by mouth every 8 (eight) hours as needed for nausea or vomiting. 07/29/18   Ripley Fraise, MD  propranolol (INDERAL) 20 MG tablet Take 1 tablet (20 mg total) by mouth 2 (two) times daily. Please allow patient to pick up 30 day supply for free 07/13/18 08/12/18  [name], NP  QUEtiapine (SEROQUEL) 100 MG tablet Take 400 mg by mouth at bedtime.     [provider]  topiramate (TOPAMAX) 25 MG tablet Take 1 tablet (25 mg total) by mouth 2 (two) times daily. Please allow patient to pick up 30 day supply for free 07/13/18 08/12/18  [name], NP  TRUEPLUS LANCETS 28G MISC Use as instructed. Monitor blood glucose levels twice per day 12/02/17   [name], NP    Family History Family History  Problem Relation Age of Onset   Diabetes Mother    Hypertension Mother    Obesity Mother    Asthma Mother    Allergic rhinitis Mother    Eczema Mother    Diabetes Father    Hypertension Father    Obesity Father    Hyperlipidemia Father    Hypertension Paternal Aunt    Hypertension Maternal Grandfather    Colon cancer Maternal Grandfather    Diabetes Paternal Grandmother    Obesity Paternal Grandmother    Diabetes Paternal Grandfather    Obesity Paternal Grandfather    Angioedema  Neg Hx    Immunodeficiency Neg Hx    Urticaria Neg Hx    Stomach cancer Neg Hx    Esophageal cancer Neg Hx     Social History Social History   Tobacco Use   Smoking status: Never Smoker   Smokeless tobacco: Never Used  Substance Use Topics   Alcohol use: No    Alcohol/week: 0.0 standard drinks   Drug use: No     Allergies   Ibuprofen   Review of Systems Review of Systems  Constitutional: Negative for chills and fever.  HENT: Negative for sore throat.   Eyes: Negative for redness.  Respiratory:  Negative for cough and shortness of breath.   Cardiovascular: Negative for chest pain.  Gastrointestinal: Negative for abdominal pain and vomiting.  Endocrine: Negative for polyuria.  Genitourinary: Negative for dysuria and flank pain.  Musculoskeletal: Negative for back pain and neck pain.  Skin: Negative for rash.  Neurological: Positive for seizures. Negative for speech difficulty, weakness and headaches.  Hematological: Does not bruise/bleed easily.  Psychiatric/Behavioral: Negative for dysphoric mood.     Physical Exam Updated Vital Signs BP 116/89 (BP Location: Right Arm)    Pulse (!) 117    Temp 99.3 F (37.4 C) (Oral)    Resp 16    LMP 07/06/2018 (Approximate)    SpO2 100%   Physical Exam Vitals signs and nursing note reviewed.  Constitutional:      Appearance: Normal appearance. She is well-developed.  HENT:     Head: Atraumatic.     Nose: Nose normal.     Mouth/Throat:     Mouth: Mucous membranes are moist.     Comments: No oral or tongue trauma.  Eyes:     General: No scleral icterus.    Extraocular Movements: Extraocular movements intact.     Conjunctiva/sclera: Conjunctivae normal.     Pupils: Pupils are equal, round, and reactive to light.  Neck:     Musculoskeletal: Normal range of motion and neck supple. No neck rigidity or muscular tenderness.     Vascular: No carotid bruit.     Trachea: No tracheal deviation.     Comments: No stiffness  or rigidity. Normal rom without pain.  Cardiovascular:     Rate and Rhythm: Normal rate and regular rhythm.     Pulses: Normal pulses.     Heart sounds: Normal heart sounds. No murmur. No friction rub. No gallop.   Pulmonary:     Effort: Pulmonary effort is normal. No respiratory distress.     Breath sounds: Normal breath sounds.  Chest:     Chest wall: No tenderness.  Abdominal:     General: Bowel sounds are normal. There is no distension.     Palpations: Abdomen is soft. There is no mass.     Tenderness: There is no abdominal tenderness. There is no guarding or rebound.     Hernia: No hernia is present.  Genitourinary:    Comments: No cva tenderness.  Musculoskeletal:        General: No swelling or tenderness.     Comments: CTLS spine, non tender, aligned, no step off. Good rom bil ext without pain or focal bony tenderness.   Skin:    General: Skin is warm and dry.     Findings: No rash.  Neurological:     Mental Status: She is alert.     Comments: Alert, speech normal/fluent. Motor intact bil, stre 5/5. sens grossly intact bil.   Psychiatric:        Mood and Affect: Mood normal.      ED Treatments / Results  Labs (all labs ordered are listed, but only abnormal results are displayed) Results for orders placed or performed during the hospital encounter of 89/21/19  Basic metabolic panel  Result Value Ref Range   Sodium 141 135 - 145 mmol/L   Potassium 3.4 (L) 3.5 - 5.1 mmol/L   Chloride 108 98 - 111 mmol/L   CO2 21 (L) 22 - 32 mmol/L   Glucose, Bld 345 (H) 70 - 99 mg/dL   BUN 6 6 - 20 mg/dL   Creatinine, Ser 1.82 (  H) 0.44 - 1.00 mg/dL   Calcium 8.5 (L) 8.9 - 10.3 mg/dL   GFR calc non Af Amer 39 (L) >60 mL/min   GFR calc Af Amer 46 (L) >60 mL/min   Anion gap 12 5 - 15   Ct Abdomen Pelvis W Contrast  Result Date: 07/25/2018 CLINICAL DATA:  Acute abdominal pain, generalized, with nausea. History of ovarian cyst. EXAM: CT ABDOMEN AND PELVIS WITH CONTRAST TECHNIQUE:  Multidetector CT imaging of the abdomen and pelvis was performed using the standard protocol following bolus administration of intravenous contrast. CONTRAST:  169m OMNIPAQUE IOHEXOL 300 MG/ML  SOLN COMPARISON:  None. FINDINGS: Lower chest: No acute abnormality. Hepatobiliary: No focal liver abnormality is seen. No gallstones, gallbladder wall thickening, or biliary dilatation. Pancreas: Unremarkable. No pancreatic ductal dilatation or surrounding inflammatory changes. Spleen: Normal in size without focal abnormality. Adrenals/Urinary Tract: Adrenal glands appear normal. Subtle low-density focus within the lower pole of the LEFT kidney, possible edema. No renal stone or hydronephrosis bilaterally. No perinephric fluid. No ureteral or bladder calculi identified. Bladder appears normal. Stomach/Bowel: No dilated large or small bowel loops. No convincing evidence of bowel wall inflammation. Stomach appears normal. Appendix is normal. Vascular/Lymphatic: No significant vascular findings are present. No enlarged abdominal or pelvic lymph nodes. Reproductive: Mild RIGHT adnexal fullness, probable small chronic ovarian cyst. LEFT adnexal regions unremarkable. No free fluid or inflammatory change within either adnexal region Other: No free fluid or abscess collection seen. No free intraperitoneal air. Musculoskeletal: No acute or suspicious osseous finding. IMPRESSION: 1. Subtle low-density focus within the lower pole of the LEFT kidney, possible edema. Recommend correlation with urinalysis for any evidence of pyelonephritis. 2. Mild RIGHT adnexal fullness, probable small chronic ovarian cyst. No free fluid or inflammatory change within either adnexal region. Consider nonemergent pelvic ultrasound at some point to ensure benignity. 3. Remainder of the abdomen and pelvis CT is unremarkable, as detailed above. No bowel obstruction or evidence of bowel wall inflammation. No evidence of acute solid organ abnormality. No renal  or ureteral calculi. Appendix is normal. Electronically Signed   By: SFranki CabotM.D.   On: 07/25/2018 04:54   Ct Abdomen Pelvis W Contrast  Result Date: 07/05/2018 CLINICAL DATA:  Abdominal pain and nausea x2 weeks EXAM: CT ABDOMEN AND PELVIS WITH CONTRAST TECHNIQUE: Multidetector CT imaging of the abdomen and pelvis was performed using the standard protocol following bolus administration of intravenous contrast. CONTRAST:  1086mOMNIPAQUE IOHEXOL 300 MG/ML  SOLN COMPARISON:  June 21, 2018 FINDINGS: Lower chest: The lung bases are clear. The heart size is normal. Hepatobiliary: The liver is enlarged measuring approximately 22 cm craniocaudad. Normal gallbladder.There is no biliary ductal dilation. Pancreas: Normal contours without ductal dilatation. No peripancreatic fluid collection. Spleen: No splenic laceration or hematoma. Adrenals/Urinary Tract: --Adrenal glands: No adrenal hemorrhage. --Right kidney/ureter: No hydronephrosis or perinephric hematoma. --Left kidney/ureter: No hydronephrosis or perinephric hematoma. --Urinary bladder: There is mild diffuse bladder wall thickening. Stomach/Bowel: --Stomach/Duodenum: No hiatal hernia or other gastric abnormality. Normal duodenal course and caliber. --Small bowel: No dilatation or inflammation. --Colon: There is a moderate amount of stool throughout the colon. No CT evidence for diverticulitis or colitis. --Appendix: Normal. Vascular/Lymphatic: Normal course and caliber of the major abdominal vessels. --No retroperitoneal lymphadenopathy. --No mesenteric lymphadenopathy. --No pelvic or inguinal lymphadenopathy. Reproductive: Unremarkable Other: No ascites or free air. The abdominal wall is normal. Musculoskeletal. No acute displaced fractures. IMPRESSION: 1. Mild bladder wall thickening of unknown clinical significance. Correlation with urinalysis is recommended. 2.  Otherwise, no acute intra-abdominal abnormality detected. 3. Moderate amount of stool  throughout the colon. Electronically Signed   By: Constance Holster M.D.   On: 07/05/2018 00:34   Dg Chest Portable 1 View  Result Date: 08/03/2018 CLINICAL DATA:  Screening exam EXAM: PORTABLE CHEST 1 VIEW COMPARISON:  Six days ago FINDINGS: Low volume chest with rotation. Normal heart size and mediastinal contours. No acute infiltrate or edema. No effusion or pneumothorax. No acute osseous findings. IMPRESSION: Negative low volume chest. Electronically Signed   By: Monte Fantasia M.D.   On: 08/03/2018 05:27   Dg Chest Port 1 View  Result Date: 07/28/2018 CLINICAL DATA:  Chest pain. EXAM: PORTABLE CHEST 1 VIEW COMPARISON:  Multiple prior exams most recent radiograph 3 days ago. FINDINGS: The cardiomediastinal contours are normal. The lungs are clear. Pulmonary vasculature is normal. No consolidation, pleural effusion, or pneumothorax. No acute osseous abnormalities are seen. IMPRESSION: Negative AP view of the chest. Electronically Signed   By: Keith Rake M.D.   On: 07/28/2018 23:47   Dg Chest Port 1 View  Result Date: 07/25/2018 CLINICAL DATA:  Pain. Emesis. EXAM: PORTABLE CHEST 1 VIEW COMPARISON:  07/05/2018 FINDINGS: The cardiomediastinal contours are normal. The lungs are clear. Pulmonary vasculature is normal. No consolidation, pleural effusion, or pneumothorax. No acute osseous abnormalities are seen. IMPRESSION: No acute chest findings. Electronically Signed   By: Keith Rake M.D.   On: 07/25/2018 03:07   Dg Chest Portable 1 View  Result Date: 07/05/2018 CLINICAL DATA:  Abdominal pain. EXAM: PORTABLE CHEST 1 VIEW COMPARISON:  June 20, 2018 FINDINGS: The cardiac silhouette is stable. There is no pneumothorax. No large pleural effusion. No infiltrate. No acute osseous abnormality. IMPRESSION: No active disease. Electronically Signed   By: Constance Holster M.D.   On: 07/05/2018 00:53    EKG None  Radiology Dg Chest Portable 1 View  Result Date: 08/03/2018 CLINICAL DATA:   Screening exam EXAM: PORTABLE CHEST 1 VIEW COMPARISON:  Six days ago FINDINGS: Low volume chest with rotation. Normal heart size and mediastinal contours. No acute infiltrate or edema. No effusion or pneumothorax. No acute osseous findings. IMPRESSION: Negative low volume chest. Electronically Signed   By: Monte Fantasia M.D.   On: 08/03/2018 05:27    Procedures Procedures (including critical care time)  Medications Ordered in ED Medications  0.9 %  sodium chloride infusion (has no administration in time range)     Initial Impression / Assessment and Plan / ED Course  I have reviewed the triage vital signs and the nursing notes.  Pertinent labs & imaging results that were available during my care of the patient were reviewed by me and considered in my medical decision making (see chart for details).  Iv ns. Seizure precautions. Cbg.   Reviewed nursing notes and prior charts for additional history. Pt had labs from this AM - reviewed by me - mild aki then. No uti.   Labs reviewed by me - AKI on bmet, and glucose also high, hco3 is not significantly low.  k sl low, kcl po. Iv ns bolus. novolog insulin sq.   Given report of multiple/recurrent seizures, will get ct.   CT reviewed by me - no acute hem/process.   Given second ED visit today, recurrent seizures, AKI on labs, hyperglycemia - will admit.   Hospitalists consulted for admission.   Hospitalists request neuro consult - Discussed with Dr Cheral Marker - he indicates keppra load earlier today, and recommends for now, getting back  on keppra reliably - he indicates keep eye on pts aki, if responds to fluid, can keep on her normal keppra dose, if AKI progresses, may need to adjust dose.      Final Clinical Impressions(s) / ED Diagnoses   Final diagnoses:  None    ED Discharge Orders    None           Lajean Saver, MD 08/03/18 1907

## 2018-08-03 NOTE — Progress Notes (Signed)
Inpatient Diabetes Program Recommendations  AACE/ADA: New Consensus Statement on Inpatient Glycemic Control (2015)  Target Ranges:  Prepandial:   less than 140 mg/dL      Peak postprandial:   less than 180 mg/dL (1-2 hours)      Critically ill patients:  140 - 180 mg/dL   Lab Results  Component Value Date   GLUCAP 311 (H) 08/03/2018   HGBA1C 12.9 (A) 07/13/2018    Review of Glycemic Control  Diabetes history: DM 1 Outpatient Diabetes medications: Lantus 20 units bid, Humalog 0-15 units 4x/day Current orders for Inpatient glycemic control: None in ED  Inpatient Diabetes Program Recommendations:    Consider placing patient on Lantus 15 units bid, Novolog 0-15 units tid + HS coverage.  Patient was seen on 6/9 by DM Coordinator regarding an A1c of 12%. Patient reported compliance w/meds and CBG checks at home. Patient follows with the [facility].   Thanks,  Tama Headings RN, MSN, BC-ADM Inpatient Diabetes Coordinator Team Pager [phone] (8a-5p)

## 2018-08-03 NOTE — ED Triage Notes (Signed)
Pt endorses having multiple seizures today, has hx and takes keppra. Has hx of DM and CHF. Tachy, axox4, slightly lethargic.

## 2018-08-03 NOTE — ED Notes (Signed)
Pt ambulated in hallway with standby assist. Pt assisted staff in dressing herself and requested we call her mother for a ride home.

## 2018-08-03 NOTE — ED Notes (Signed)
Spoke with patients mother over the phone and instructed her to please have keppra filled. Continue home seizure medications and follow up with her medical doctor. Mother verbalized understanding.

## 2018-08-04 ENCOUNTER — Ambulatory Visit: Payer: Medicaid Other | Admitting: Pharmacist

## 2018-08-04 DIAGNOSIS — Z794 Long term (current) use of insulin: Secondary | ICD-10-CM | POA: Diagnosis not present

## 2018-08-04 DIAGNOSIS — Z833 Family history of diabetes mellitus: Secondary | ICD-10-CM | POA: Diagnosis not present

## 2018-08-04 DIAGNOSIS — E860 Dehydration: Secondary | ICD-10-CM

## 2018-08-04 DIAGNOSIS — I5020 Unspecified systolic (congestive) heart failure: Secondary | ICD-10-CM | POA: Diagnosis present

## 2018-08-04 DIAGNOSIS — R9431 Abnormal electrocardiogram [ECG] [EKG]: Secondary | ICD-10-CM | POA: Diagnosis present

## 2018-08-04 DIAGNOSIS — Z8249 Family history of ischemic heart disease and other diseases of the circulatory system: Secondary | ICD-10-CM | POA: Diagnosis not present

## 2018-08-04 DIAGNOSIS — Z9119 Patient's noncompliance with other medical treatment and regimen: Secondary | ICD-10-CM | POA: Diagnosis not present

## 2018-08-04 DIAGNOSIS — E876 Hypokalemia: Secondary | ICD-10-CM | POA: Diagnosis present

## 2018-08-04 DIAGNOSIS — Z20828 Contact with and (suspected) exposure to other viral communicable diseases: Secondary | ICD-10-CM | POA: Diagnosis present

## 2018-08-04 DIAGNOSIS — Z800 Family history of malignant neoplasm of digestive organs: Secondary | ICD-10-CM | POA: Diagnosis not present

## 2018-08-04 DIAGNOSIS — M545 Low back pain: Secondary | ICD-10-CM | POA: Diagnosis present

## 2018-08-04 DIAGNOSIS — R739 Hyperglycemia, unspecified: Secondary | ICD-10-CM | POA: Diagnosis not present

## 2018-08-04 DIAGNOSIS — N179 Acute kidney failure, unspecified: Secondary | ICD-10-CM | POA: Diagnosis present

## 2018-08-04 DIAGNOSIS — I110 Hypertensive heart disease with heart failure: Secondary | ICD-10-CM | POA: Diagnosis present

## 2018-08-04 DIAGNOSIS — G8929 Other chronic pain: Secondary | ICD-10-CM | POA: Diagnosis present

## 2018-08-04 DIAGNOSIS — G40909 Epilepsy, unspecified, not intractable, without status epilepticus: Secondary | ICD-10-CM | POA: Diagnosis not present

## 2018-08-04 DIAGNOSIS — F329 Major depressive disorder, single episode, unspecified: Secondary | ICD-10-CM | POA: Diagnosis present

## 2018-08-04 DIAGNOSIS — Z825 Family history of asthma and other chronic lower respiratory diseases: Secondary | ICD-10-CM | POA: Diagnosis not present

## 2018-08-04 DIAGNOSIS — E1065 Type 1 diabetes mellitus with hyperglycemia: Secondary | ICD-10-CM | POA: Diagnosis present

## 2018-08-04 DIAGNOSIS — Z79899 Other long term (current) drug therapy: Secondary | ICD-10-CM | POA: Diagnosis not present

## 2018-08-04 DIAGNOSIS — Z8349 Family history of other endocrine, nutritional and metabolic diseases: Secondary | ICD-10-CM | POA: Diagnosis not present

## 2018-08-04 LAB — BASIC METABOLIC PANEL
Anion gap: 7 (ref 5–15)
BUN: 5 mg/dL — ABNORMAL LOW (ref 6–20)
CO2: 21 mmol/L — ABNORMAL LOW (ref 22–32)
Calcium: 8.2 mg/dL — ABNORMAL LOW (ref 8.9–10.3)
Chloride: 113 mmol/L — ABNORMAL HIGH (ref 98–111)
Creatinine, Ser: 1.33 mg/dL — ABNORMAL HIGH (ref 0.44–1.00)
GFR calc Af Amer: >60 mL/min (ref >60)
GFR calc non Af Amer: 57 mL/min — ABNORMAL LOW (ref >60)
Glucose, Bld: 173 mg/dL — ABNORMAL HIGH (ref 70–99)
Potassium: 3.3 mmol/L — ABNORMAL LOW (ref 3.5–5.1)
Sodium: 141 mmol/L (ref 135–145)

## 2018-08-04 LAB — GLUCOSE, CAPILLARY
Glucose-Capillary: 159 mg/dL — ABNORMAL HIGH (ref 70–99)
Glucose-Capillary: 120 mg/dL — ABNORMAL HIGH (ref 70–99)
Glucose-Capillary: 208 mg/dL — ABNORMAL HIGH (ref 70–99)
Glucose-Capillary: 128 mg/dL — ABNORMAL HIGH (ref 70–99)

## 2018-08-04 LAB — CBC
HCT: 38.1 % (ref 36.0–46.0)
Hemoglobin: 12.5 g/dL (ref 12.0–15.0)
MCH: 28.4 pg (ref 26.0–34.0)
MCHC: 32.8 g/dL (ref 30.0–36.0)
MCV: 86.6 fL (ref 80.0–100.0)
Platelets: 345 10*3/uL (ref 150–400)
RBC: 4.4 MIL/uL (ref 3.87–5.11)
RDW: 13.4 % (ref 11.5–15.5)
WBC: 10.1 10*3/uL (ref 4.0–10.5)
nRBC: 0 % (ref 0.0–0.2)

## 2018-08-04 MED ORDER — POTASSIUM CHLORIDE CRYS ER 20 MEQ PO TBCR
40.0000 meq | EXTENDED_RELEASE_TABLET | Freq: Once | ORAL | Status: AC
  Administered 2018-08-04: 40 meq via ORAL
  Filled 2018-08-04: qty 2

## 2018-08-04 MED ORDER — SODIUM CHLORIDE 0.9 % IV BOLUS
500.0000 mL | Freq: Once | INTRAVENOUS | Status: AC
  Administered 2018-08-04: 500 mL via INTRAVENOUS

## 2018-08-04 MED ORDER — MAGNESIUM SULFATE 2 GM/50ML IV SOLN
2.0000 g | Freq: Once | INTRAVENOUS | Status: AC
  Administered 2018-08-04: 2 g via INTRAVENOUS
  Filled 2018-08-04 (×2): qty 50

## 2018-08-04 NOTE — Progress Notes (Signed)
[facility] PROGRESS NOTE  Nancy [name] TOI:[id number] DOB: 2000/05/21 DOA: 08/03/2018 PCP: [name], NP  Assessment/Plan: SEIZURE : hx of seizure disorder. Home meds include Topamax and high-dose Keppra, 2000 twice daily at home. Long hx of non-compliance and presumably same. Chart review indicates ED MD spoke to neurology who recommended resuming home keppra dose (loading dose had occurred earlier presentation) and monitoring AKI as dose will need to be adjusted if AKI progresses. Today creatinine trending down. No further seizure activity -will continue IV fluids -continue keppra at home dose -prn ativan -seizure precautions  AKI. Likely related to decreased po intake and vomiting in setting of multiple seizures. Creatinine trending down to 1.3 from 1.8. CK within limits of normal -continue IV fluids -monitor urine output -recheck in am  PROLONGED QTC: hx of same thought to be secondary to medications and electrolyte abnormality. -We will hold fluoxetine -Continue quetiapine -Hold Phenergan -replete magnesium and potassium  Hypokalemia. Related to above -replete -recheck in am  Hypomagnesemia -replete  HTN. controlled Continue Inderal  HFrEF. Patient with an EF of 45 to 50% per previous notes No evidence of acute decompensation at present -continue home meds -monitor intake and output -obtain daily weights  IDDM. Uncontrolled. Home regimen Lantus 20 twice daily. Serum glucose 345 -SSI -continue home regimen  CHRONIC PAIN stble Continue gabapentin  DEPRESSION Continue Seroquel however will hold fluoxetine due to elevated QT    Code Status: full Family Communication: patient Disposition Plan: home hopefully tomorrow   Consultants:    Procedures:    Antibiotics:    HPI/Subjective: Lying in bed eyes closed. Arouses to verbal stimuli. Complains nausea and abdominal pain  Objective: Vitals:   08/04/18 0320 08/04/18 0819   BP: 116/67 128/78  Pulse: 93 (!) 106  Resp: 18 18  Temp: 98.7 F (37.1 C) 97.8 F (36.6 C)  SpO2: 100% 100%   No intake or output data in the 24 hours ending 08/04/18 0951 Filed Weights   08/03/18 2020  Weight: 82.6 kg    Exam:   General:  Obese drowsy no acute distress  Cardiovascular: tachycardia but regular no mgr No LE edema  Respiratory: normal effort BS clear bilaterally no wheeze  Abdomen: obese soft sluggish BS mild diffuse tenderness to palpation. No guarding or rebounding  Musculoskeletal: joints without swelling/erythema   Data Reviewed: Basic Metabolic Panel: Recent Labs  Lab 07/29/18 0004 08/03/18 0511 08/03/18 0608 08/03/18 1235 08/03/18 1720 08/04/18 0345  NA 135 140 142 141  --  141  K 3.4* 3.5 3.6 3.4*  --  3.3*  CL 103 106  --  108  --  113*  CO2 21* 20*  --  21*  --  21*  GLUCOSE 422* 330*  --  345*  --  173*  BUN <5* 6  --  6  --  5*  CREATININE 0.62 1.42*  --  1.82*  --  1.33*  CALCIUM 8.6* 8.8*  --  8.5*  --  8.2*  MG  --   --   --   --  1.5*  --    Liver Function Tests: Recent Labs  Lab 08/03/18 0511  AST 14*  ALT 12  ALKPHOS 63  BILITOT 0.4  PROT 6.7  ALBUMIN 3.9   No results for input(s): LIPASE, AMYLASE in the last 168 hours. No results for input(s): AMMONIA in the last 168 hours. CBC: Recent Labs  Lab 07/29/18 0004 08/03/18 0511 08/03/18 0608 08/04/18 0345  WBC 9.5  9.9  --  10.1  NEUTROABS 6.6 5.4  --   --   HGB 12.0 13.8 16.0* 12.5  HCT 37.1 42.2 47.0* 38.1  MCV 86.7 87.0  --  86.6  PLT 257 338  --  345   Cardiac Enzymes: Recent Labs  Lab 08/03/18 1720  CKTOTAL 41   BNP (last 3 results) Recent Labs    01/16/18 1830 01/24/18 1313 01/29/18 2134  BNP 5.6 4.8 12.4    ProBNP (last 3 results) No results for input(s): PROBNP in the last 8760 hours.  CBG: Recent Labs  Lab 07/28/18 2241 07/29/18 0311 08/03/18 0456 08/03/18 2111 08/04/18 0605  GLUCAP 425* 221* 311* 251* 159*    Recent Results  (from the past 240 hour(s))  SARS Coronavirus 2 (CEPHEID - Performed in [facility] lab), Hosp Order     Status: None   Collection Time: 08/03/18  4:59 AM   Specimen: Nasopharyngeal Swab  Result Value Ref Range Status   SARS Coronavirus 2 NEGATIVE NEGATIVE Final    Comment: (NOTE) If result is NEGATIVE SARS-CoV-2 target nucleic acids are NOT DETECTED. The SARS-CoV-2 RNA is generally detectable in upper and lower  respiratory specimens during the acute phase of infection. The lowest  concentration of SARS-CoV-2 viral copies this assay can detect is 250  copies / mL. A negative result does not preclude SARS-CoV-2 infection  and should not be used as the sole basis for treatment or other  patient management decisions.  A negative result may occur with  improper specimen collection / handling, submission of specimen other  than nasopharyngeal swab, presence of viral mutation(s) within the  areas targeted by this assay, and inadequate number of viral copies  (<250 copies / mL). A negative result must be combined with clinical  observations, patient history, and epidemiological information. If result is POSITIVE SARS-CoV-2 target nucleic acids are DETECTED. The SARS-CoV-2 RNA is generally detectable in upper and lower  respiratory specimens dur ing the acute phase of infection.  Positive  results are indicative of active infection with SARS-CoV-2.  Clinical  correlation with patient history and other diagnostic information is  necessary to determine patient infection status.  Positive results do  not rule out bacterial infection or co-infection with other viruses. If result is PRESUMPTIVE POSTIVE SARS-CoV-2 nucleic acids MAY BE PRESENT.   A presumptive positive result was obtained on the submitted specimen  and confirmed on repeat testing.  While 2019 novel coronavirus  (SARS-CoV-2) nucleic acids may be present in the submitted sample  additional confirmatory testing may be  necessary for epidemiological  and / or clinical management purposes  to differentiate between  SARS-CoV-2 and other Sarbecovirus currently known to infect humans.  If clinically indicated additional testing with an alternate test  methodology [phone] is advised. The SARS-CoV-2 RNA is generally  detectable in upper and lower respiratory sp ecimens during the acute  phase of infection. The expected result is Negative. Fact Sheet for Patients:  BoilerBrush.com.cyhttps://www.fda.gov/media/136312/download Fact Sheet for Healthcare Providers: https://pope.com/https://www.fda.gov/media/136313/download This test is not yet approved or cleared by the [place] FDA and has been authorized for detection and/or diagnosis of SARS-CoV-2 by FDA under an Emergency Use Authorization (EUA).  This EUA will remain in effect (meaning this test can be used) for the duration of the COVID-19 declaration under Section 564(b)(1) of the Act, 21 U.S.C. section 360bbb-3(b)(1), unless the authorization is terminated or revoked sooner. Performed at [facility], [address] [place], [place] [address]  Studies: Ct Head Wo Contrast  Result Date: 08/03/2018 CLINICAL DATA:  Ct head wo, Pt endorses having multiple seizures today, has hx and takes keppra. Has hx of DM and CHF. Tachy, axox4, slightly lethargic. EXAM: CT HEAD WITHOUT CONTRAST TECHNIQUE: Contiguous axial images were obtained from the base of the skull through the vertex without intravenous contrast. COMPARISON:  06/20/2018 FINDINGS: Brain: No evidence of acute infarction, hemorrhage, hydrocephalus, extra-axial collection or mass lesion/mass effect. Vascular: No hyperdense vessel or unexpected calcification. Skull: Normal. Negative for fracture or focal lesion. Sinuses/Orbits: Normal globes and orbits.  Clear sinuses. Other: None. IMPRESSION: Normal unenhanced CT scan of the brain. Electronically Signed   By: Amie Portland M.D.   On: 08/03/2018 16:19   Dg Chest Portable 1  View  Result Date: 08/03/2018 CLINICAL DATA:  Screening exam EXAM: PORTABLE CHEST 1 VIEW COMPARISON:  Six days ago FINDINGS: Low volume chest with rotation. Normal heart size and mediastinal contours. No acute infiltrate or edema. No effusion or pneumothorax. No acute osseous findings. IMPRESSION: Negative low volume chest. Electronically Signed   By: Marnee Spring M.D.   On: 08/03/2018 05:27    Scheduled Meds: . atorvastatin  20 mg Oral Daily  . enoxaparin (LOVENOX) injection  40 mg Subcutaneous Q24H  . gabapentin  300 mg Oral QHS  . insulin aspart  0-15 Units Subcutaneous TID WC  . insulin glargine  20 Units Subcutaneous BID  . levETIRAcetam  1,000 mg Oral BID  . propranolol  20 mg Oral BID  . QUEtiapine  400 mg Oral QHS  . topiramate  25 mg Oral BID   Continuous Infusions: . sodium chloride 20 mL/hr at 08/03/18 1252  . sodium chloride 100 mL/hr at 08/04/18 0505  . magnesium sulfate bolus IVPB    . sodium chloride      Principal Problem:   Seizures (HCC) Active Problems:   Non compliance with medical treatment   Uncontrolled type 2 diabetes mellitus with hyperglycemia (HCC)   Hypertension   Prolonged QT interval   Hypokalemia   Hypomagnesemia   Acute kidney injury (HCC)    Time spent: 45 minutes    [facility] M NP  [facility]  If 7PM-7AM, please contact night-coverage at [email or web address], password [facility] 08/04/2018, 9:51 AM  LOS: 0 days

## 2018-08-04 NOTE — Progress Notes (Signed)
Attempted to speak with patient but she was sleeping and c/o pain. She did not want to talk with CM currently. Bedside RN aware of pts complaint. TOC following.

## 2018-08-05 DIAGNOSIS — R739 Hyperglycemia, unspecified: Secondary | ICD-10-CM

## 2018-08-05 LAB — BASIC METABOLIC PANEL
Anion gap: 7 (ref 5–15)
BUN: 9 mg/dL (ref 6–20)
CO2: 21 mmol/L — ABNORMAL LOW (ref 22–32)
Calcium: 8.6 mg/dL — ABNORMAL LOW (ref 8.9–10.3)
Chloride: 113 mmol/L — ABNORMAL HIGH (ref 98–111)
Creatinine, Ser: 1.08 mg/dL — ABNORMAL HIGH (ref 0.44–1.00)
GFR calc Af Amer: >60 mL/min (ref >60)
GFR calc non Af Amer: >60 mL/min (ref >60)
Glucose, Bld: 238 mg/dL — ABNORMAL HIGH (ref 70–99)
Potassium: 3.5 mmol/L (ref 3.5–5.1)
Sodium: 141 mmol/L (ref 135–145)

## 2018-08-05 LAB — GLUCOSE, CAPILLARY
Glucose-Capillary: 212 mg/dL — ABNORMAL HIGH (ref 70–99)
Glucose-Capillary: 131 mg/dL — ABNORMAL HIGH (ref 70–99)

## 2018-08-05 MED ORDER — POTASSIUM CHLORIDE CRYS ER 20 MEQ PO TBCR
40.0000 meq | EXTENDED_RELEASE_TABLET | Freq: Once | ORAL | Status: AC
  Administered 2018-08-05: 40 meq via ORAL
  Filled 2018-08-05: qty 2

## 2018-08-05 NOTE — Plan of Care (Signed)
  Problem: Education: Goal: Expressions of having a comfortable level of knowledge regarding the disease process will increase Outcome: Progressing   Problem: Coping: Goal: Ability to adjust to condition or change in health will improve Outcome: Progressing Goal: Ability to identify appropriate support needs will improve Outcome: Progressing   Problem: Health Behavior/Discharge Planning: Goal: Compliance with prescribed medication regimen will improve Outcome: Progressing   Problem: Medication: Goal: Risk for medication side effects will decrease Outcome: Progressing   Problem: Clinical Measurements: Goal: Complications related to the disease process, condition or treatment will be avoided or minimized Outcome: Progressing Goal: Diagnostic test results will improve Outcome: Progressing   Problem: Safety: Goal: Verbalization of understanding the information provided will improve Outcome: Progressing   Problem: Self-Concept: Goal: Level of anxiety will decrease Outcome: Progressing Goal: Ability to verbalize feelings about condition will improve Outcome: Progressing   Ollis Daudelin, BSN, RN 

## 2018-08-05 NOTE — Discharge Summary (Signed)
Physician Discharge Summary  KAYLEIGH BROADWELL EXB:[id number] DOB: November 26, 2000 DOA: 08/03/2018  PCP: [name], NP  Admit date: 08/03/2018 Discharge date: 08/05/2018  Time spent: 45 minutes  Recommendations for Outpatient Follow-up:  1. Follow up with PCP 1-2 weeks for evaluation of symptoms. Recommend bmet to track electrolytes and kidney function 2. Follow up with neurology as scheduled    Discharge Diagnoses:  Principal Problem:   Seizures ([place]) Active Problems:   Non compliance with medical treatment   Uncontrolled type 2 diabetes mellitus with hyperglycemia (HCC)   Hypertension   Prolonged QT interval   Hypokalemia   Hypomagnesemia   Acute kidney injury ([place])   Seizure ([place])   Discharge Condition: stable  Diet recommendation: heart healthy carb modified  Filed Weights   08/03/18 2020 08/05/18 0354  Weight: 82.6 kg 89.3 kg    History of present illness:  BIANCO CANGE is an 18 y.o. female with past medical history significant for known seizure disorder, type 1 diabetes with multiple admissions for DKA, medical noncompliance, hypertension, HFr EF, depression and chronic pain who presented 7/28 with recurrent seizures.  Patient was seen earlier that day with a seizure  brought in by her mother.  Extensive work-up was negative for any acute causes of seizures.  Patient  known to have recurrent seizures at home.  She was loaded with Keppra and given another prescription for Keppra and discharged home.  However on the way home patient apparently had another seizure and was brought back to the ED.  Per history patient stated that she has multiple seizures a week.  Noted that she often has 1-2 seizures a day.  Patient stated that she is compliant with her medications.  She had no acute complaints and stated that she did not remember her seizures earlier that day.  She stated that she had an outpatient neurology appointment for initial evaluation in the first week of  August.   Hospital Course:   SEIZURE : hx of seizure disorder. Home meds include Topamax and high-dose Keppra, 2000 twice daily at home. Long hx of non-compliance and presumably same. Chart review indicates ED MD spoke to neurology who recommended resuming home keppra dose (loading dose had occurred earlier presentation) and monitoring AKI as dose will need to be adjusted if AKI progresses. Creatinine 1.08 at discharge down from 1.82 on admission. No further seizure activity. Has a neuro appointment next week.   AKI. Likely related to decreased po intake and vomiting in setting of multiple seizures. Creatinine trending down to 1.08 from 1.8. CK within limits of normal.  PROLONGED QTC: hx of same thought to be secondary to medications and electrolyte abnormality.magnesuim repleated as well as potassium.   Hypokalemia. Related to above -repleted. Potassium level 3.5 at discharge. Will give kdur 68mq before discharge.   Hypomagnesemia -repleted  HTN. controlled Continue Inderal  HFrEF. Patient with an EF of 45 to 50% per previous notes No evidence of acute decompensation.  IDDM. Uncontrolled. Mostly due to non-compliance. HgA1c 12.9 07/13/18.  Home regimen Lantus 20 twice daily. Serum glucose 345 on admission. Encouraged better control  CHRONIC PAIN stble Continue gabapentin  DEPRESSION Continue Seroquel   Procedures:    Consultations:    Discharge Exam: Vitals:   08/05/18 0354 08/05/18 0814  BP: 119/61 104/67  Pulse: 89 86  Resp: 16 17  Temp: 99 F (37.2 C) 99.4 F (37.4 C)  SpO2: 100% 100%    General: awake alert no acute distress Cardiovascular: rrr no  mgr no LE edema Respiratory: normal effort BS clear bilaterally   Discharge Instructions    Allergies as of 08/05/2018      Reactions   Ibuprofen Other (See Comments)   GI MD said to not take this anymore      Medication List    TAKE these medications   atorvastatin 20 MG tablet Commonly known  as: Lipitor Take 1 tablet (20 mg total) by mouth daily.   diclofenac sodium 1 % Gel Commonly known as: VOLTAREN Apply 2 g topically 3 (three) times daily as needed (as directed to painful sites).   FLUoxetine 20 MG capsule Commonly known as: PROZAC Take 1 capsule (20 mg total) by mouth at bedtime. Please allow patient to pick up 30 day supply for free   gabapentin 300 MG capsule Commonly known as: NEURONTIN Take 1 capsule (300 mg total) by mouth at bedtime for 30 days.   glucagon 1 MG injection Use for Severe Hypoglycemia . Inject 1 mg intramuscularly if unresponsive, unable to swallow, unconscious and/or has seizure What changed:   how much to take  how to take this  when to take this  reasons to take this  additional instructions   glucose blood test strip Commonly known as: True Metrix Blood Glucose Test Use as instructed   insulin glargine 100 UNIT/ML injection Commonly known as: LANTUS Inject 0.2 mLs (20 Units total) into the skin 2 (two) times daily.   insulin lispro 100 UNIT/ML injection Commonly known as: HUMALOG Inject 0-0.15 mLs (0-15 Units total) into the skin 4 (four) times daily -  with meals and at bedtime. What changed: additional instructions   Insulin Pen Needle 31G X 5 MM Misc Commonly known as: B-D UF III MINI PEN NEEDLES Use as instructed. Monitor blood glucose levels twice per day   levETIRAcetam 500 MG tablet Commonly known as: KEPPRA Take 2 tablets (1,000 mg total) by mouth 2 (two) times daily.   meloxicam 7.5 MG tablet Commonly known as: MOBIC Take 7.5 mg by mouth daily.   promethazine 25 MG tablet Commonly known as: PHENERGAN Take 1 tablet (25 mg total) by mouth every 8 (eight) hours as needed for nausea or vomiting.   propranolol 20 MG tablet Commonly known as: INDERAL Take 1 tablet (20 mg total) by mouth 2 (two) times daily. Please allow patient to pick up 30 day supply for free   QUEtiapine 100 MG tablet Commonly known as:  SEROQUEL Take 400 mg by mouth at bedtime.   topiramate 25 MG tablet Commonly known as: Topamax Take 1 tablet (25 mg total) by mouth 2 (two) times daily. Please allow patient to pick up 30 day supply for free   True Metrix Meter w/Device Kit Use as instructed. Monitor blood glucose levels twice per day   TRUEplus Lancets 28G Misc Use as instructed. Monitor blood glucose levels twice per day      Allergies  Allergen Reactions  . Ibuprofen Other (See Comments)    GI MD said to not take this anymore      The results of significant diagnostics from this hospitalization (including imaging, microbiology, ancillary and laboratory) are listed below for reference.    Significant Diagnostic Studies: Ct Head Wo Contrast  Result Date: 08/03/2018 CLINICAL DATA:  Ct head wo, Pt endorses having multiple seizures today, has hx and takes keppra. Has hx of DM and CHF. Tachy, axox4, slightly lethargic. EXAM: CT HEAD WITHOUT CONTRAST TECHNIQUE: Contiguous axial images were obtained from the base of  the skull through the vertex without intravenous contrast. COMPARISON:  06/20/2018 FINDINGS: Brain: No evidence of acute infarction, hemorrhage, hydrocephalus, extra-axial collection or mass lesion/mass effect. Vascular: No hyperdense vessel or unexpected calcification. Skull: Normal. Negative for fracture or focal lesion. Sinuses/Orbits: Normal globes and orbits.  Clear sinuses. Other: None. IMPRESSION: Normal unenhanced CT scan of the brain. Electronically Signed   By: Lajean Manes M.D.   On: 08/03/2018 16:19   Ct Abdomen Pelvis W Contrast  Result Date: 07/25/2018 CLINICAL DATA:  Acute abdominal pain, generalized, with nausea. History of ovarian cyst. EXAM: CT ABDOMEN AND PELVIS WITH CONTRAST TECHNIQUE: Multidetector CT imaging of the abdomen and pelvis was performed using the standard protocol following bolus administration of intravenous contrast. CONTRAST:  154m OMNIPAQUE IOHEXOL 300 MG/ML  SOLN  COMPARISON:  None. FINDINGS: Lower chest: No acute abnormality. Hepatobiliary: No focal liver abnormality is seen. No gallstones, gallbladder wall thickening, or biliary dilatation. Pancreas: Unremarkable. No pancreatic ductal dilatation or surrounding inflammatory changes. Spleen: Normal in size without focal abnormality. Adrenals/Urinary Tract: Adrenal glands appear normal. Subtle low-density focus within the lower pole of the LEFT kidney, possible edema. No renal stone or hydronephrosis bilaterally. No perinephric fluid. No ureteral or bladder calculi identified. Bladder appears normal. Stomach/Bowel: No dilated large or small bowel loops. No convincing evidence of bowel wall inflammation. Stomach appears normal. Appendix is normal. Vascular/Lymphatic: No significant vascular findings are present. No enlarged abdominal or pelvic lymph nodes. Reproductive: Mild RIGHT adnexal fullness, probable small chronic ovarian cyst. LEFT adnexal regions unremarkable. No free fluid or inflammatory change within either adnexal region Other: No free fluid or abscess collection seen. No free intraperitoneal air. Musculoskeletal: No acute or suspicious osseous finding. IMPRESSION: 1. Subtle low-density focus within the lower pole of the LEFT kidney, possible edema. Recommend correlation with urinalysis for any evidence of pyelonephritis. 2. Mild RIGHT adnexal fullness, probable small chronic ovarian cyst. No free fluid or inflammatory change within either adnexal region. Consider nonemergent pelvic ultrasound at some point to ensure benignity. 3. Remainder of the abdomen and pelvis CT is unremarkable, as detailed above. No bowel obstruction or evidence of bowel wall inflammation. No evidence of acute solid organ abnormality. No renal or ureteral calculi. Appendix is normal. Electronically Signed   By: SFranki CabotM.D.   On: 07/25/2018 04:54   Dg Chest Portable 1 View  Result Date: 08/03/2018 CLINICAL DATA:  Screening exam  EXAM: PORTABLE CHEST 1 VIEW COMPARISON:  Six days ago FINDINGS: Low volume chest with rotation. Normal heart size and mediastinal contours. No acute infiltrate or edema. No effusion or pneumothorax. No acute osseous findings. IMPRESSION: Negative low volume chest. Electronically Signed   By: JMonte FantasiaM.D.   On: 08/03/2018 05:27   Dg Chest Port 1 View  Result Date: 07/28/2018 CLINICAL DATA:  Chest pain. EXAM: PORTABLE CHEST 1 VIEW COMPARISON:  Multiple prior exams most recent radiograph 3 days ago. FINDINGS: The cardiomediastinal contours are normal. The lungs are clear. Pulmonary vasculature is normal. No consolidation, pleural effusion, or pneumothorax. No acute osseous abnormalities are seen. IMPRESSION: Negative AP view of the chest. Electronically Signed   By: MKeith RakeM.D.   On: 07/28/2018 23:47   Dg Chest Port 1 View  Result Date: 07/25/2018 CLINICAL DATA:  Pain. Emesis. EXAM: PORTABLE CHEST 1 VIEW COMPARISON:  07/05/2018 FINDINGS: The cardiomediastinal contours are normal. The lungs are clear. Pulmonary vasculature is normal. No consolidation, pleural effusion, or pneumothorax. No acute osseous abnormalities are seen. IMPRESSION: No acute chest  findings. Electronically Signed   By: Keith Rake M.D.   On: 07/25/2018 03:07    Microbiology: Recent Results (from the past 240 hour(s))  SARS Coronavirus 2 (CEPHEID - Performed in [place] hospital lab), Hosp Order     Status: None   Collection Time: 08/03/18  4:59 AM   Specimen: Nasopharyngeal Swab  Result Value Ref Range Status   SARS Coronavirus 2 NEGATIVE NEGATIVE Final    Comment: (NOTE) If result is NEGATIVE SARS-CoV-2 target nucleic acids are NOT DETECTED. The SARS-CoV-2 RNA is generally detectable in upper and lower  respiratory specimens during the acute phase of infection. The lowest  concentration of SARS-CoV-2 viral copies this assay can detect is 250  copies / mL. A negative result does not preclude  SARS-CoV-2 infection  and should not be used as the sole basis for treatment or other  patient management decisions.  A negative result may occur with  improper specimen collection / handling, submission of specimen other  than nasopharyngeal swab, presence of viral mutation(s) within the  areas targeted by this assay, and inadequate number of viral copies  (<250 copies / mL). A negative result must be combined with clinical  observations, patient history, and epidemiological information. If result is POSITIVE SARS-CoV-2 target nucleic acids are DETECTED. The SARS-CoV-2 RNA is generally detectable in upper and lower  respiratory specimens dur ing the acute phase of infection.  Positive  results are indicative of active infection with SARS-CoV-2.  Clinical  correlation with patient history and other diagnostic information is  necessary to determine patient infection status.  Positive results do  not rule out bacterial infection or co-infection with other viruses. If result is PRESUMPTIVE POSTIVE SARS-CoV-2 nucleic acids MAY BE PRESENT.   A presumptive positive result was obtained on the submitted specimen  and confirmed on repeat testing.  While 2019 novel coronavirus  (SARS-CoV-2) nucleic acids may be present in the submitted sample  additional confirmatory testing may be necessary for epidemiological  and / or clinical management purposes  to differentiate between  SARS-CoV-2 and other Sarbecovirus currently known to infect humans.  If clinically indicated additional testing with an alternate test  methodology [phone]) is advised. The SARS-CoV-2 RNA is generally  detectable in upper and lower respiratory sp ecimens during the acute  phase of infection. The expected result is Negative. Fact Sheet for Patients:  StrictlyIdeas.no Fact Sheet for Healthcare Providers: BankingDealers.co.za This test is not yet approved or cleared by the  [place] FDA and has been authorized for detection and/or diagnosis of SARS-CoV-2 by FDA under an Emergency Use Authorization (EUA).  This EUA will remain in effect (meaning this test can be used) for the duration of the COVID-19 declaration under Section 564(b)(1) of the Act, 21 U.S.C. section 360bbb-3(b)(1), unless the authorization is terminated or revoked sooner. Performed at [facility], [place] [address] [place], [place] [address]      Labs: Basic Metabolic Panel: Recent Labs  Lab 08/03/18 0511 08/03/18 0608 08/03/18 1235 08/03/18 1720 08/04/18 0345 08/05/18 0334  NA 140 142 141  --  141 141  K 3.5 3.6 3.4*  --  3.3* 3.5  CL 106  --  108  --  113* 113*  CO2 20*  --  21*  --  21* 21*  GLUCOSE 330*  --  345*  --  173* 238*  BUN 6  --  6  --  5* 9  CREATININE 1.42*  --  1.82*  --  1.33* 1.08*  CALCIUM 8.8*  --  8.5*  --  8.2* 8.6*  MG  --   --   --  1.5*  --   --    Liver Function Tests: Recent Labs  Lab 08/03/18 0511  AST 14*  ALT 12  ALKPHOS 63  BILITOT 0.4  PROT 6.7  ALBUMIN 3.9   No results for input(s): LIPASE, AMYLASE in the last 168 hours. No results for input(s): AMMONIA in the last 168 hours. CBC: Recent Labs  Lab 08/03/18 0511 08/03/18 0608 08/04/18 0345  WBC 9.9  --  10.1  NEUTROABS 5.4  --   --   HGB 13.8 16.0* 12.5  HCT 42.2 47.0* 38.1  MCV 87.0  --  86.6  PLT 338  --  345   Cardiac Enzymes: Recent Labs  Lab 08/03/18 1720  CKTOTAL 41   BNP: BNP (last 3 results) Recent Labs    01/16/18 1830 01/24/18 1313 01/29/18 2134  BNP 5.6 4.8 12.4    ProBNP (last 3 results) No results for input(s): PROBNP in the last 8760 hours.  CBG: Recent Labs  Lab 08/04/18 0605 08/04/18 1122 08/04/18 1659 08/04/18 2123 08/05/18 0616  GLUCAP 159* 120* 208* 128* 212*       Signed:  Radene Gunning NP [facility] 08/05/2018, 10:19 AM   Patient was seen, examined,treatment plan was discussed with the Advance Practice  Provider.  I have personally reviewed the clinical findings, labs, EKG, imaging studies and management of this patient in detail. I have also reviewed the orders written for this patient which were under my direction. I agree with the documentation, as recorded by the Advance Practice Provider.   ELIM PEALE is a 18 y.o. female  Who is non-compliant with her medications.  Suspect she will continue to have issues with her blood sugars and seizures.  Geradine Girt, DO  How to contact the [facility] Attending or Consulting provider [place] or covering provider during after hours [place], for this patient?  1. Check the care team in [facility] and look for a) attending/consulting TR[facility] provider listed and b) the [facility] team listed 2. Log into [email or web address] and use [place] universal password to access. If you do not have the password, please contact the hospital operator. 3. Locate the [facility] provider you are looking for under [facility] and page to a number that you can be directly reached. 4. If you still have difficulty reaching the provider, please page the [facility] (Director on Call) for the Hospitalists listed on amion for assistance.

## 2018-08-05 NOTE — Plan of Care (Signed)
Pt progressed toward goals and is adequate for discharge.

## 2018-08-05 NOTE — TOC Transition Note (Signed)
Transition of Care [facility]) - CM/SW Discharge Note   Patient Details  Name: Nancy [name] MRN: [id number] Date of Birth: 2000/07/27  Transition of [facility]) CM/SW Contact:  Pollie Friar, RN Phone Number: 08/05/2018, 1:46 PM   Clinical Narrative:    Pt discharging home with self care. Pt stats she has transportation home. Pt states her parents can provide needed transportation. She denies issues obtaining home meds.  Pt has PCP for hospital f/u.   Final next level of care: Home/Self Care Barriers to Discharge: No Barriers Identified   Patient Goals and CMS Choice        Discharge Placement                       Discharge Plan and Services                                     Social Determinants of Health (SDOH) Interventions     Readmission Risk Interventions Readmission Risk Prevention Plan 06/15/2018  Transportation Screening Complete  Medication Review ([place]) Complete  PCP or Specialist appointment within 3-5 days of discharge Complete  HRI or [place] Complete  SW Recovery Care/Counseling Consult Complete  [place] Not Applicable  Some recent data might be hidden

## 2018-08-05 NOTE — Progress Notes (Signed)
Messaged MD to notify of pt stating that she did not feel ready for discharge. Pt states that she felt more confused. NP returned  page and states that pt is medically ready for discharge and that MD would be around to round on pt.

## 2018-08-05 NOTE — Progress Notes (Signed)
Pt given discharge instructions and educated on the importance of complying with medication regimen. Pt voiced understanding of discharge instructions with no concerns. All pt belongings sent with her. Pt transported home via private vehicle.

## 2018-08-05 NOTE — Discharge Instructions (Signed)
Blood Glucose Monitoring, Adult °Monitoring your blood sugar (glucose) is an important part of managing your diabetes (diabetes mellitus). Blood glucose monitoring involves checking your blood glucose as often as directed and keeping a record (log) of your results over time. °Checking your blood glucose regularly and keeping a blood glucose log can: °· Help you and your health care provider adjust your diabetes management plan as needed, including your medicines or insulin. °· Help you understand how food, exercise, illnesses, and medicines affect your blood glucose. °· Let you know what your blood glucose is at any time. You can quickly find out if you have low blood glucose (hypoglycemia) or high blood glucose (hyperglycemia). °Your health care provider will set individualized treatment goals for you. Your goals will be based on your age, other medical conditions you have, and how you respond to diabetes treatment. Generally, the goal of treatment is to maintain the following blood glucose levels: °· Before meals (preprandial): 80-130 mg/dL (4.4-7.2 mmol/L). °· After meals (postprandial): below 180 mg/dL (10 mmol/L). °· A1c level: less than 7%. °Supplies needed: °· Blood glucose meter. °· Test strips for your meter. Each meter has its own strips. You must use the strips that came with your meter. °· A needle to prick your finger (lancet). Do not use a lancet more than one time. °· A device that holds the lancet (lancing device). °· A journal or log book to write down your results. °How to check your blood glucose ° °1. Wash your hands with soap and water. °2. Prick the side of your finger (not the tip) with the lancet. Use a different finger each time. °3. Gently rub the finger until a small drop of blood appears. °4. Follow instructions that come with your meter for inserting the test strip, applying blood to the strip, and using your blood glucose meter. °5. Write down your result and any notes. °Some meters  allow you to use areas of your body other than your finger (alternative sites) to test your blood. The most common alternative sites are: °· Forearm. °· Thigh. °· Palm of the hand. °If you think you may have hypoglycemia, or if you have a history of not knowing when your blood glucose is getting low (hypoglycemia unawareness), do not use alternative sites. Use your finger instead. Alternative sites may not be as accurate as the fingers, because blood flow is slower in these areas. This means that the result you get may be delayed, and it may be different from the result that you would get from your finger. °Follow these instructions at home: °Blood glucose log ° °· Every time you check your blood glucose, write down your result. Also write down any notes about things that may be affecting your blood glucose, such as your diet and exercise for the day. This information can help you and your health care provider: °? Look for patterns in your blood glucose over time. °? Adjust your diabetes management plan as needed. °· Check if your meter allows you to download your records to a computer. Most glucose meters store a record of glucose readings in the meter. °If you have type 1 diabetes: °· Check your blood glucose 2 or more times a day. °· Also check your blood glucose: °? Before every insulin injection. °? Before and after exercise. °? Before meals. °? 2 hours after a meal. °? Occasionally between 2:00 a.m. and 3:00 a.m., as directed. °? Before potentially dangerous tasks, like driving or using heavy machinery. °?   At bedtime.  You may need to check your blood glucose more often, up to 6-10 times a day, if you: ? Use an insulin pump. ? Need multiple daily injections (MDI). ? Have diabetes that is not well-controlled. ? Are ill. ? Have a history of severe hypoglycemia. ? Have hypoglycemia unawareness. If you have type 2 diabetes:  If you take insulin or other diabetes medicines, check your blood glucose 2 or  more times a day.  If you are on intensive insulin therapy, check your blood glucose 4 or more times a day. Occasionally, you may also need to check between 2:00 a.m. and 3:00 a.m., as directed.  Also check your blood glucose: ? Before and after exercise. ? Before potentially dangerous tasks, like driving or using heavy machinery.  You may need to check your blood glucose more often if: ? Your medicine is being adjusted. ? Your diabetes is not well-controlled. ? You are ill. General tips  Always keep your supplies with you.  If you have questions or need help, all blood glucose meters have a 24-hour "hotline" phone number that you can call. You may also contact your health care provider.  After you use a few boxes of test strips, adjust (calibrate) your blood glucose meter by following instructions that came with your meter. Contact a health care provider if:  Your blood glucose is at or above 240 mg/dL (13.3 mmol/L) for 2 days in a row.  You have been sick or have had a fever for 2 days or longer, and you are not getting better.  You have any of the following problems for more than 6 hours: ? You cannot eat or drink. ? You have nausea or vomiting. ? You have diarrhea. Get help right away if:  Your blood glucose is lower than 54 mg/dL (3 mmol/L).  You become confused or you have trouble thinking clearly.  You have difficulty breathing.  You have moderate or large ketone levels in your urine. Summary  Monitoring your blood sugar (glucose) is an important part of managing your diabetes (diabetes mellitus).  Blood glucose monitoring involves checking your blood glucose as often as directed and keeping a record (log) of your results over time.  Your health care provider will set individualized treatment goals for you. Your goals will be based on your age, other medical conditions you have, and how you respond to diabetes treatment.  Every time you check your blood glucose,  write down your result. Also write down any notes about things that may be affecting your blood glucose, such as your diet and exercise for the day. This information is not intended to replace advice given to you by your health care provider. Make sure you discuss any questions you have with your health care provider. Document Released: 12/26/2002 Document Revised: 10/16/2017 Document Reviewed: 06/04/2015 Elsevier Patient Education  2020 [employer].   Epilepsy Epilepsy is when a person keeps having seizures. A seizure is a burst of abnormal activity in the brain. A seizure can change how you think or behave, and it can make it hard to be aware of what is happening. This condition can cause problems such as:  Falls, accidents, and injury.  Sadness (depression).  Poor memory.  Sudden unexplained death in epilepsy (SUDEP). This is rare. Its cause is not known. Most people with epilepsy lead normal lives. What are the causes? This condition may be caused by:  A head injury.  An injury that happens at birth.  A high fever during childhood.  A stroke.  Bleeding that goes into or around the brain.  Certain medicines and drugs.  Having too little oxygen for a long period of time.  Abnormal brain development.  Certain infections.  Brain tumors.  Conditions that are passed from parent to child (are hereditary). What are the signs or symptoms? Symptoms of a seizure vary from person to person. They may include:  Jerky movements of muscles (convulsions).  Stiffening of the body.  Movements of the arms or legs that you are not able to control.  Passing out (loss of consciousness).  Breathing problems.  Sudden falls.  Confusion.  Head nodding.  Eye blinking or twitching.  Lip smacking.  Drooling.  Fast eye movements.  Grunting.  Not being able to control when you pee or poop.  Staring.  Being hard to wake up (unresponsiveness). Some people have symptoms  right before a seizure happens (aura) and right after a seizure happens. These symptoms include:  Fear or anxiety.  Feeling sick to your stomach (nauseous).  Feeling like the room is spinning (vertigo).  A feeling of having seen or heard something before (dj vu).  Odd tastes or smells.  Changes in how you see (vision), such as seeing flashing lights or spots. Symptoms that follow a seizure include:  Being confused.  Being sleepy.  Having a headache. How is this treated? Treatment can control seizures. Treatment for this condition may involve:  Taking medicines to control seizures.  Having a device (vagus nerve stimulator) put in the chest. The device sends signals to a nerve and to the brain to prevent seizures.  Brain surgery to stop seizures from happening or to reduce how often they happen.  Having blood tests often to make sure you are getting the right amount of medicine. Once this condition has been diagnosed, it is important to start treatment as soon as possible. For some people, epilepsy goes away in time. Others will need treatment for the rest of their life. Follow these instructions at home: Medicines  Take over-the-counter and prescription medicines only as told by your doctor.  Avoid anything that may keep your medicine from working, such as alcohol. Activity  Get enough rest. Lack of sleep can make seizures more likely to occur.  Follow your doctor's advice about driving, swimming, and doing anything else that would be dangerous if you had a seizure. ? If you live in the [place] check with your local DMV (department of motor vehicles) to find out about local driving laws. Each state has rules about when you can return to driving. Teaching others Teach friends and family what to do if you have a seizure. They should:  Lay you on the ground to prevent a fall.  Cushion your head and body.  Loosen any tight clothing around your neck.  Turn you on your  side.  Stay with you until you are better.  Not hold you down.  Not put anything in your mouth.  Know whether or not you need emergency care.  General instructions  Avoid anything that causes you to have seizures.  Keep a seizure diary. Write down what you remember about each seizure. Be sure to include what might have caused it.  Keep all follow-up visits as told by your doctor. This is important. Contact a doctor if:  You have a change in how often or when you have seizures.  You get an infection or start to feel sick. You may have  more seizures when you are sick. Get help right away if:  A seizure does not stop after 5 minutes.  You have more than one seizure in a row, and you do not have enough time between the seizures to feel better.  A seizure makes it harder to breathe.  A seizure is different from other seizures you have had.  A seizure makes you unable to speak or use a part of your body.  You did not wake up right after a seizure. These symptoms may be an emergency. Do not wait to see if the symptoms will go away. Get medical help right away. Call your local emergency services (911 in the [place] Do not drive yourself to the hospital. Summary  Epilepsy is when a person keeps having seizures. A seizure is a burst of abnormal activity in the brain.  Treatment can control seizures.  Teach friends and family what to do if you have a seizure. This information is not intended to replace advice given to you by your health care provider. Make sure you discuss any questions you have with your health care provider. Document Released: 10/20/2008 Document Revised: 08/17/2017 Document Reviewed: 08/17/2017 Elsevier Patient Education  2020 [employer]   Dehydration, Adult  Dehydration is when there is not enough fluid or water in your body. This happens when you lose more fluids than you take in. Dehydration can range from mild to very bad. It should be treated right  away to keep it from getting very bad. Symptoms of mild dehydration may include:  Thirst.  Dry lips.  Slightly dry mouth.  Dry, warm skin.  Dizziness. Symptoms of moderate dehydration may include:  Very dry mouth.  Muscle cramps.  Dark pee (urine). Pee may be the color of tea.  Your body making less pee.  Your eyes making fewer tears.  Heartbeat that is uneven or faster than normal (palpitations).  Headache.  Light-headedness, especially when you stand up from sitting.  Fainting (syncope). Symptoms of very bad dehydration may include:  Changes in skin, such as: ? Cold and clammy skin. ? Blotchy (mottled) or pale skin. ? Skin that does not quickly return to normal after being lightly pinched and let go (poor skin turgor).  Changes in body fluids, such as: ? Feeling very thirsty. ? Your eyes making fewer tears. ? Not sweating when body temperature is high, such as in hot weather. ? Your body making very little pee.  Changes in vital signs, such as: ? Weak pulse. ? Pulse that is more than 100 beats a minute when you are sitting still. ? Fast breathing. ? Low blood pressure.  Other changes, such as: ? Sunken eyes. ? Cold hands and feet. ? Confusion. ? Lack of energy (lethargy). ? Trouble waking up from sleep. ? Short-term weight loss. ? Unconsciousness. Follow these instructions at home:   If told by your doctor, drink an ORS: ? Make an ORS by using instructions on the package. ? Start by drinking small amounts, about  cup (120 mL) every 5-10 minutes. ? Slowly drink more until you have had the amount that your doctor said to have.  Drink enough clear fluid to keep your pee clear or pale yellow. If you were told to drink an ORS, finish the ORS first, then start slowly drinking clear fluids. Drink fluids such as: ? Water. Do not drink only water by itself. Doing that can make the salt (sodium) level in your body get too low (hyponatremia). ?  Ice  chips. ? Fruit juice that you have added water to (diluted). ? Low-calorie sports drinks.  Avoid: ? Alcohol. ? Drinks that have a lot of sugar. These include high-calorie sports drinks, fruit juice that does not have water added, and soda. ? Caffeine. ? Foods that are greasy or have a lot of fat or sugar.  Take over-the-counter and prescription medicines only as told by your doctor.  Do not take salt tablets. Doing that can make the salt level in your body get too high (hypernatremia).  Eat foods that have minerals (electrolytes). Examples include bananas, oranges, potatoes, tomatoes, and spinach.  Keep all follow-up visits as told by your doctor. This is important. Contact a doctor if:  You have belly (abdominal) pain that: ? Gets worse. ? Stays in one area (localizes).  You have a rash.  You have a stiff neck.  You get angry or annoyed more easily than normal (irritability).  You are more sleepy than normal.  You have a harder time waking up than normal.  You feel: ? Weak. ? Dizzy. ? Very thirsty.  You have peed (urinated) only a small amount of very dark pee during 6-8 hours. Get help right away if:  You have symptoms of very bad dehydration.  You cannot drink fluids without throwing up (vomiting).  Your symptoms get worse with treatment.  You have a fever.  You have a very bad headache.  You are throwing up or having watery poop (diarrhea) and it: ? Gets worse. ? Does not go away.  You have blood or something green (bile) in your throw-up.  You have blood in your poop (stool). This may cause poop to look black and tarry.  You have not peed in 6-8 hours.  You pass out (faint).  Your heart rate when you are sitting still is more than 100 beats a minute.  You have trouble breathing. This information is not intended to replace advice given to you by your health care provider. Make sure you discuss any questions you have with your health care  provider. Document Released: 10/19/2008 Document Revised: 12/05/2016 Document Reviewed: 02/16/2015 Elsevier Patient Education  2020 [employer].

## 2018-08-06 ENCOUNTER — Emergency Department (HOSPITAL_COMMUNITY): Payer: Medicaid Other

## 2018-08-06 ENCOUNTER — Other Ambulatory Visit: Payer: Self-pay

## 2018-08-06 ENCOUNTER — Inpatient Hospital Stay (HOSPITAL_COMMUNITY)
Admission: EM | Admit: 2018-08-06 | Discharge: 2018-08-08 | DRG: 101 | Disposition: A | Discharge status: Home / Self Care | Payer: Medicaid Other | Attending: Internal Medicine | Admitting: Internal Medicine

## 2018-08-06 ENCOUNTER — Inpatient Hospital Stay (HOSPITAL_COMMUNITY): Payer: Medicaid Other

## 2018-08-06 DIAGNOSIS — Z8249 Family history of ischemic heart disease and other diseases of the circulatory system: Secondary | ICD-10-CM

## 2018-08-06 DIAGNOSIS — Z794 Long term (current) use of insulin: Secondary | ICD-10-CM | POA: Diagnosis not present

## 2018-08-06 DIAGNOSIS — M545 Low back pain: Secondary | ICD-10-CM | POA: Diagnosis present

## 2018-08-06 DIAGNOSIS — Z79899 Other long term (current) drug therapy: Secondary | ICD-10-CM | POA: Diagnosis not present

## 2018-08-06 DIAGNOSIS — Z800 Family history of malignant neoplasm of digestive organs: Secondary | ICD-10-CM

## 2018-08-06 DIAGNOSIS — Z87440 Personal history of urinary (tract) infections: Secondary | ICD-10-CM

## 2018-08-06 DIAGNOSIS — G40909 Epilepsy, unspecified, not intractable, without status epilepticus: Principal | ICD-10-CM | POA: Diagnosis present

## 2018-08-06 DIAGNOSIS — G8929 Other chronic pain: Secondary | ICD-10-CM | POA: Diagnosis present

## 2018-08-06 DIAGNOSIS — E1065 Type 1 diabetes mellitus with hyperglycemia: Secondary | ICD-10-CM | POA: Diagnosis present

## 2018-08-06 DIAGNOSIS — K08409 Partial loss of teeth, unspecified cause, unspecified class: Secondary | ICD-10-CM | POA: Diagnosis present

## 2018-08-06 DIAGNOSIS — I110 Hypertensive heart disease with heart failure: Secondary | ICD-10-CM | POA: Diagnosis present

## 2018-08-06 DIAGNOSIS — Z886 Allergy status to analgesic agent status: Secondary | ICD-10-CM | POA: Diagnosis not present

## 2018-08-06 DIAGNOSIS — Z9119 Patient's noncompliance with other medical treatment and regimen: Secondary | ICD-10-CM | POA: Diagnosis not present

## 2018-08-06 DIAGNOSIS — F445 Conversion disorder with seizures or convulsions: Secondary | ICD-10-CM

## 2018-08-06 DIAGNOSIS — E785 Hyperlipidemia, unspecified: Secondary | ICD-10-CM | POA: Diagnosis present

## 2018-08-06 DIAGNOSIS — F418 Other specified anxiety disorders: Secondary | ICD-10-CM | POA: Diagnosis present

## 2018-08-06 DIAGNOSIS — E669 Obesity, unspecified: Secondary | ICD-10-CM | POA: Diagnosis present

## 2018-08-06 DIAGNOSIS — N179 Acute kidney failure, unspecified: Secondary | ICD-10-CM | POA: Diagnosis present

## 2018-08-06 DIAGNOSIS — E876 Hypokalemia: Secondary | ICD-10-CM | POA: Diagnosis present

## 2018-08-06 DIAGNOSIS — I5022 Chronic systolic (congestive) heart failure: Secondary | ICD-10-CM | POA: Diagnosis present

## 2018-08-06 DIAGNOSIS — R569 Unspecified convulsions: Secondary | ICD-10-CM

## 2018-08-06 DIAGNOSIS — E6609 Other obesity due to excess calories: Secondary | ICD-10-CM | POA: Diagnosis not present

## 2018-08-06 DIAGNOSIS — Z6834 Body mass index (BMI) 34.0-34.9, adult: Secondary | ICD-10-CM | POA: Diagnosis not present

## 2018-08-06 DIAGNOSIS — I10 Essential (primary) hypertension: Secondary | ICD-10-CM | POA: Diagnosis not present

## 2018-08-06 DIAGNOSIS — Z791 Long term (current) use of non-steroidal anti-inflammatories (NSAID): Secondary | ICD-10-CM

## 2018-08-06 DIAGNOSIS — I152 Hypertension secondary to endocrine disorders: Secondary | ICD-10-CM | POA: Diagnosis present

## 2018-08-06 DIAGNOSIS — F32A Depression, unspecified: Secondary | ICD-10-CM | POA: Diagnosis present

## 2018-08-06 DIAGNOSIS — F329 Major depressive disorder, single episode, unspecified: Secondary | ICD-10-CM | POA: Diagnosis present

## 2018-08-06 DIAGNOSIS — E1165 Type 2 diabetes mellitus with hyperglycemia: Secondary | ICD-10-CM | POA: Diagnosis present

## 2018-08-06 DIAGNOSIS — Z91199 Patient's noncompliance with other medical treatment and regimen due to unspecified reason: Secondary | ICD-10-CM

## 2018-08-06 DIAGNOSIS — Z833 Family history of diabetes mellitus: Secondary | ICD-10-CM

## 2018-08-06 DIAGNOSIS — IMO0002 Reserved for concepts with insufficient information to code with codable children: Secondary | ICD-10-CM | POA: Diagnosis present

## 2018-08-06 DIAGNOSIS — L83 Acanthosis nigricans: Secondary | ICD-10-CM | POA: Diagnosis present

## 2018-08-06 LAB — CBC
HCT: 39.9 % (ref 36.0–46.0)
HCT: 36.2 % (ref 36.0–46.0)
Hemoglobin: 12.9 g/dL (ref 12.0–15.0)
Hemoglobin: 11.8 g/dL — ABNORMAL LOW (ref 12.0–15.0)
MCH: 28.7 pg (ref 26.0–34.0)
MCH: 28.4 pg (ref 26.0–34.0)
MCHC: 32.3 g/dL (ref 30.0–36.0)
MCHC: 32.6 g/dL (ref 30.0–36.0)
MCV: 88.7 fL (ref 80.0–100.0)
MCV: 87.2 fL (ref 80.0–100.0)
Platelets: 380 10*3/uL (ref 150–400)
Platelets: 291 10*3/uL (ref 150–400)
RBC: 4.5 MIL/uL (ref 3.87–5.11)
RBC: 4.15 MIL/uL (ref 3.87–5.11)
RDW: 13.4 % (ref 11.5–15.5)
RDW: 13.6 % (ref 11.5–15.5)
WBC: 8.7 10*3/uL (ref 4.0–10.5)
WBC: 7.3 10*3/uL (ref 4.0–10.5)
nRBC: 0 % (ref 0.0–0.2)
nRBC: 0 % (ref 0.0–0.2)

## 2018-08-06 LAB — URINALYSIS, ROUTINE W REFLEX MICROSCOPIC
Bilirubin Urine: NEGATIVE
Glucose, UA: >=500 mg/dL — AB
Ketones, ur: 20 mg/dL — AB
Leukocytes,Ua: NEGATIVE
Nitrite: NEGATIVE
Protein, ur: NEGATIVE mg/dL
Specific Gravity, Urine: 1.008 (ref 1.005–1.030)
pH: 6 (ref 5.0–8.0)

## 2018-08-06 LAB — CBG MONITORING, ED
Glucose-Capillary: 236 mg/dL — ABNORMAL HIGH (ref 70–99)
Glucose-Capillary: 175 mg/dL — ABNORMAL HIGH (ref 70–99)

## 2018-08-06 LAB — BASIC METABOLIC PANEL
Anion gap: 12 (ref 5–15)
BUN: 7 mg/dL (ref 6–20)
CO2: 19 mmol/L — ABNORMAL LOW (ref 22–32)
Calcium: 9.1 mg/dL (ref 8.9–10.3)
Chloride: 108 mmol/L (ref 98–111)
Creatinine, Ser: 1.52 mg/dL — ABNORMAL HIGH (ref 0.44–1.00)
GFR calc Af Amer: 57 mL/min — ABNORMAL LOW (ref >60)
GFR calc non Af Amer: 49 mL/min — ABNORMAL LOW (ref >60)
Glucose, Bld: 291 mg/dL — ABNORMAL HIGH (ref 70–99)
Potassium: 3.7 mmol/L (ref 3.5–5.1)
Sodium: 139 mmol/L (ref 135–145)

## 2018-08-06 LAB — LIPASE, BLOOD: Lipase: 25 U/L (ref 11–51)

## 2018-08-06 LAB — MRSA PCR SCREENING: MRSA by PCR: NEGATIVE

## 2018-08-06 LAB — GLUCOSE, CAPILLARY
Glucose-Capillary: 185 mg/dL — ABNORMAL HIGH (ref 70–99)
Glucose-Capillary: 119 mg/dL — ABNORMAL HIGH (ref 70–99)
Glucose-Capillary: 112 mg/dL — ABNORMAL HIGH (ref 70–99)

## 2018-08-06 LAB — HEPATIC FUNCTION PANEL
ALT: 13 U/L (ref 0–44)
AST: 15 U/L (ref 15–41)
Albumin: 3.8 g/dL (ref 3.5–5.0)
Alkaline Phosphatase: 57 U/L (ref 38–126)
Bilirubin, Direct: 0.1 mg/dL (ref 0.0–0.2)
Indirect Bilirubin: 0.4 mg/dL (ref 0.3–0.9)
Total Bilirubin: 0.5 mg/dL (ref 0.3–1.2)
Total Protein: 6.7 g/dL (ref 6.5–8.1)

## 2018-08-06 LAB — I-STAT BETA HCG BLOOD, ED (MC, WL, AP ONLY): I-stat hCG, quantitative: <5 m[IU]/mL (ref <5)

## 2018-08-06 LAB — TROPONIN I (HIGH SENSITIVITY)
Troponin I (High Sensitivity): 2 ng/L (ref <18)
Troponin I (High Sensitivity): 3 ng/L (ref <18)

## 2018-08-06 LAB — CREATININE, SERUM
Creatinine, Ser: 1.37 mg/dL — ABNORMAL HIGH (ref 0.44–1.00)
GFR calc Af Amer: >60 mL/min (ref >60)
GFR calc non Af Amer: 55 mL/min — ABNORMAL LOW (ref >60)

## 2018-08-06 MED ORDER — SODIUM CHLORIDE 0.9 % IV SOLN
75.0000 mL/h | INTRAVENOUS | Status: DC
  Administered 2018-08-06: 14:00:00 75 mL/h via INTRAVENOUS

## 2018-08-06 MED ORDER — INSULIN GLARGINE 100 UNIT/ML ~~LOC~~ SOLN
20.0000 [IU] | Freq: Two times a day (BID) | SUBCUTANEOUS | Status: DC
  Administered 2018-08-07 – 2018-08-08 (×3): 20 [IU] via SUBCUTANEOUS
  Filled 2018-08-06 (×6): qty 0.2

## 2018-08-06 MED ORDER — GABAPENTIN 300 MG PO CAPS
300.0000 mg | ORAL_CAPSULE | Freq: Every day | ORAL | Status: DC
  Administered 2018-08-06: 300 mg via ORAL
  Filled 2018-08-06: qty 1

## 2018-08-06 MED ORDER — HYDROCODONE-ACETAMINOPHEN 5-325 MG PO TABS
2.0000 | ORAL_TABLET | Freq: Once | ORAL | Status: DC
  Filled 2018-08-06: qty 2

## 2018-08-06 MED ORDER — PROPRANOLOL HCL 20 MG PO TABS
20.0000 mg | ORAL_TABLET | Freq: Two times a day (BID) | ORAL | Status: DC
  Administered 2018-08-06 – 2018-08-08 (×4): 20 mg via ORAL
  Filled 2018-08-06 (×5): qty 1

## 2018-08-06 MED ORDER — TOPIRAMATE 25 MG PO TABS
25.0000 mg | ORAL_TABLET | Freq: Two times a day (BID) | ORAL | Status: DC
  Administered 2018-08-06: 25 mg via ORAL
  Filled 2018-08-06: qty 1

## 2018-08-06 MED ORDER — INSULIN ASPART 100 UNIT/ML ~~LOC~~ SOLN: 4.0000 [IU] | Freq: Once | SUBCUTANEOUS | Status: DC

## 2018-08-06 MED ORDER — QUETIAPINE FUMARATE 400 MG PO TABS
400.0000 mg | ORAL_TABLET | Freq: Every day | ORAL | Status: DC
  Administered 2018-08-06 – 2018-08-07 (×2): 400 mg via ORAL
  Filled 2018-08-06 (×3): qty 1

## 2018-08-06 MED ORDER — LEVETIRACETAM 500 MG PO TABS
1000.0000 mg | ORAL_TABLET | Freq: Two times a day (BID) | ORAL | Status: DC
  Administered 2018-08-06: 1000 mg via ORAL
  Filled 2018-08-06: qty 2

## 2018-08-06 MED ORDER — ACETAMINOPHEN 325 MG PO TABS
650.0000 mg | ORAL_TABLET | ORAL | Status: DC | PRN
  Administered 2018-08-06 – 2018-08-08 (×2): 650 mg via ORAL
  Filled 2018-08-06 (×3): qty 2

## 2018-08-06 MED ORDER — DICLOFENAC SODIUM 1 % TD GEL
2.0000 g | Freq: Three times a day (TID) | TRANSDERMAL | Status: DC | PRN
  Filled 2018-08-06: qty 100

## 2018-08-06 MED ORDER — KETOROLAC TROMETHAMINE 30 MG/ML IJ SOLN
15.0000 mg | Freq: Once | INTRAMUSCULAR | Status: AC
  Administered 2018-08-06: 15 mg via INTRAVENOUS
  Filled 2018-08-06: qty 1

## 2018-08-06 MED ORDER — ENOXAPARIN SODIUM 40 MG/0.4ML ~~LOC~~ SOLN
40.0000 mg | SUBCUTANEOUS | Status: DC
  Administered 2018-08-06 – 2018-08-07 (×2): 40 mg via SUBCUTANEOUS
  Filled 2018-08-06 (×2): qty 0.4

## 2018-08-06 MED ORDER — POLYETHYLENE GLYCOL 3350 17 G PO PACK: 17.0000 g | PACK | Freq: Every day | ORAL | Status: DC | PRN

## 2018-08-06 MED ORDER — SODIUM CHLORIDE 0.9 % IV BOLUS (SEPSIS)
1000.0000 mL | Freq: Once | INTRAVENOUS | Status: AC
  Administered 2018-08-06: 1000 mL via INTRAVENOUS

## 2018-08-06 MED ORDER — ONDANSETRON HCL 4 MG/2ML IJ SOLN
4.0000 mg | Freq: Four times a day (QID) | INTRAMUSCULAR | Status: DC | PRN
  Administered 2018-08-06 – 2018-08-07 (×2): 4 mg via INTRAVENOUS
  Filled 2018-08-06 (×2): qty 2

## 2018-08-06 MED ORDER — FLUOXETINE HCL 20 MG PO CAPS
20.0000 mg | ORAL_CAPSULE | Freq: Every day | ORAL | Status: DC
  Administered 2018-08-06 – 2018-08-07 (×2): 20 mg via ORAL
  Filled 2018-08-06 (×2): qty 1

## 2018-08-06 MED ORDER — ATORVASTATIN CALCIUM 10 MG PO TABS
20.0000 mg | ORAL_TABLET | Freq: Every day | ORAL | Status: DC
  Administered 2018-08-06 – 2018-08-08 (×3): 20 mg via ORAL
  Filled 2018-08-06 (×3): qty 2

## 2018-08-06 MED ORDER — INSULIN ASPART 100 UNIT/ML ~~LOC~~ SOLN: 8.0000 [IU] | Freq: Once | SUBCUTANEOUS | Status: DC

## 2018-08-06 MED ORDER — ACETAMINOPHEN 650 MG RE SUPP: 650.0000 mg | RECTAL | Status: DC | PRN

## 2018-08-06 MED ORDER — ONDANSETRON HCL 4 MG PO TABS: 4.0000 mg | ORAL_TABLET | Freq: Four times a day (QID) | ORAL | Status: DC | PRN

## 2018-08-06 MED ORDER — LORAZEPAM 2 MG/ML IJ SOLN
4.0000 mg | INTRAMUSCULAR | Status: AC
  Filled 2018-08-06: qty 2

## 2018-08-06 MED ORDER — MELOXICAM 7.5 MG PO TABS
7.5000 mg | ORAL_TABLET | Freq: Every day | ORAL | Status: DC
  Administered 2018-08-06 – 2018-08-08 (×3): 7.5 mg via ORAL
  Filled 2018-08-06 (×3): qty 1

## 2018-08-06 MED ORDER — SODIUM CHLORIDE 0.9 % IV SOLN
1000.0000 mL | INTRAVENOUS | Status: DC
  Administered 2018-08-06: 1000 mL via INTRAVENOUS

## 2018-08-06 MED ORDER — KETOROLAC TROMETHAMINE 30 MG/ML IJ SOLN
30.0000 mg | Freq: Once | INTRAMUSCULAR | Status: AC
  Administered 2018-08-06: 30 mg via INTRAVENOUS
  Filled 2018-08-06: qty 1

## 2018-08-06 MED ORDER — INSULIN ASPART 100 UNIT/ML ~~LOC~~ SOLN
0.0000 [IU] | SUBCUTANEOUS | Status: DC
  Administered 2018-08-06 – 2018-08-07 (×2): 4 [IU] via SUBCUTANEOUS
  Administered 2018-08-07: 7 [IU] via SUBCUTANEOUS
  Administered 2018-08-07 – 2018-08-08 (×2): 3 [IU] via SUBCUTANEOUS
  Administered 2018-08-08 (×2): 7 [IU] via SUBCUTANEOUS

## 2018-08-06 NOTE — Progress Notes (Addendum)
At 18:23 o'clock patient had a seizure activity for approx.1 min. She was shaking (upper and lower extremities), resp. rate 33. MD notified. Per MD no Ativan at this time. Will continue to monitor.

## 2018-08-06 NOTE — ED Notes (Signed)
Report given to [place], RN on 5W

## 2018-08-06 NOTE — Discharge Planning (Signed)
[facility] consulted regarding pt multiple admissions to ED.  Pt is active with [facility] with PCP.  Will continue to follow for disposition needs.

## 2018-08-06 NOTE — ED Notes (Signed)
Return from ct scan.

## 2018-08-06 NOTE — H&P (Signed)
History and Physical    Nancy [name] TGG:[id number] DOB: 2000-09-13 DOA: 08/06/2018  PCP: [name], NP  Patient coming from: Home after discharge yesterday  I have personally briefly reviewed patient's old medical records in [place]  Chief Complaint: Uncontrolled seizures  HPI: Nancy [name] is a 18 y.o. female with medical history significant of diabetes type 2, morbid obesity, prematurity as an infant, precocious adrenarche, congestive heart failure with EF of 45 to 50% in January 2020 who was just discharged yesterday from our facility after having an evaluation for seizures.  The patient has had 3 admissions since June 8 and today will be her fourth one.  All of these admissions have been for uncontrolled seizures.  She was seen in consultation on June 8 by Dr. Cheral Marker from neurology who felt that her seizures were perhaps precipitated by a focus that was noted on an MRI in 2019 and associated with hyperglycemia.  The entirety of the history is obtained from the ER physician the hospital records as patient is completely obtunded and not able to speak to me.  Patient had called EMS and told EMS that she fell and hit her head.  She also complained of having abdominal pain and chest pain to the nurse.  On evaluation by the ER doctor she complained of pain in her chest but not when I evaluated her.  Cannot tell me nothing about her seizures.  Planes of difficulty moving her arms and legs to the ER physician but not to me. ED Course: Patient with a waxing and waning course in the emergency department after a brief period of inability to move her legs she returned back to normal and began complaining of more chest and abdominal pain.  Laboratory tests were unremarkable with the exception of mild hyperglycemia and a slight decrease in bicarbonate.  No evidence of DKA with a normal anion gap.  Another seizure occurred in the emergency department witnessed by nursing staff.  Post  seizure she was postictal.  Records reveal that she has multiple seizures per day.  It is questionable as to whether these are true epileptic seizures versus nonepileptic seizure-like activity.  However when I evaluated the patient personally she remained extremely postictal and was unresponsive even to deep painful stimulation.  Case discussed with Dr. Cheral Marker who recommended admission to a progressive unit and will try to obtain EEG monitoring while in the hospital.  Review of Systems: Patient obtunded and unable to give any history  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[place])    Premature baby    Seizures ([place])    Type II diabetes mellitus ([place])    insulin dependant    Past Surgical History:  Procedure Laterality Date   ABDOMINAL HERNIA REPAIR     "I was a baby"   HERNIA REPAIR     TONSILLECTOMY AND ADENOIDECTOMY     WISDOM TOOTH EXTRACTION  2017    Social History   Social History Narrative   Lives with mom and mom's girlfriend. Dad involved. 9th grade [employer].  Definitely has some developmental  related deficits likely present since birth  reports that she has never smoked. She has never used smokeless tobacco. She reports that she does not drink alcohol or use drugs.  Allergies  Allergen Reactions   Ibuprofen Other (See Comments)    GI MD said to not take this anymore    Family History  Problem Relation Age of Onset   Diabetes Mother    Hypertension Mother    Obesity Mother    Asthma Mother    Allergic rhinitis Mother    Eczema Mother    Diabetes Father    Hypertension Father    Obesity Father    Hyperlipidemia Father    Hypertension Paternal Aunt    Hypertension Maternal  Grandfather    Colon cancer Maternal Grandfather    Diabetes Paternal Grandmother    Obesity Paternal Grandmother    Diabetes Paternal Grandfather    Obesity Paternal Grandfather    Angioedema Neg Hx    Immunodeficiency Neg Hx    Urticaria Neg Hx    Stomach cancer Neg Hx    Esophageal cancer Neg Hx     Prior to Admission medications   Medication Sig Start Date End Date Taking? Authorizing Provider  atorvastatin (LIPITOR) 20 MG tablet Take 1 tablet (20 mg total) by mouth daily. 07/26/18 10/24/18  Mercy Riding, MD  Blood Glucose Monitoring Suppl (TRUE METRIX METER) w/Device KIT Use as instructed. Monitor blood glucose levels twice per day 12/02/17   [name], NP  diclofenac sodium (VOLTAREN) 1 % GEL Apply 2 g topically 3 (three) times daily as needed (as directed to painful sites).     [provider]  FLUoxetine (PROZAC) 20 MG capsule Take 1 capsule (20 mg total) by mouth at bedtime. Please allow patient to pick up 30 day supply for free 07/13/18 08/12/18  [name], NP  gabapentin (NEURONTIN) 300 MG capsule Take 1 capsule (300 mg total) by mouth at bedtime for 30 days. 06/02/18 07/05/27  [name], NP  glucagon 1 MG injection Use for Severe Hypoglycemia . Inject 1 mg intramuscularly if unresponsive, unable to swallow, unconscious and/or has seizure Patient taking differently: Inject 1 mg into the muscle once as needed (for severe hypoglycemia- if unresponsive, unable to swallow, unconscious, and/or has had seizure(s)).  07/20/13 12/19/25  Renato Shin, MD  glucose blood (TRUE METRIX BLOOD GLUCOSE TEST) test strip Use as instructed 02/15/18   [name], NP  insulin glargine (LANTUS) 100 UNIT/ML injection Inject 0.2 mLs (20 Units total) into the skin 2 (two) times daily. 07/26/18   Mercy Riding, MD  insulin lispro (HUMALOG) 100 UNIT/ML injection Inject 0-0.15 mLs (0-15 Units total) into the skin 4 (four) times daily -  with meals and at bedtime. Patient  taking differently: Inject 0-15 Units into the skin 4 (four) times daily -  with meals and at bedtime. Sliding scale 07/26/18 08/25/18  Mercy Riding, MD  Insulin Pen Needle (B-D UF III MINI PEN NEEDLES) 31G X 5 MM MISC Use as instructed. Monitor blood glucose levels twice per day 02/15/18   [name], NP  levETIRAcetam (KEPPRA) 500 MG tablet Take 2 tablets (1,000 mg total) by mouth 2 (two) times daily. 08/03/18 09/02/18  Curatolo, Adam, DO  meloxicam (MOBIC) 7.5 MG tablet Take 7.5 mg by mouth daily.    [provider]  promethazine (PHENERGAN) 25 MG tablet Take 1 tablet (25 mg total) by mouth every 8 (eight) hours as needed for nausea or vomiting. 07/29/18   Wickline,  Elenore Rota, MD  propranolol (INDERAL) 20 MG tablet Take 1 tablet (20 mg total) by mouth 2 (two) times daily. Please allow patient to pick up 30 day supply for free 07/13/18 08/12/18  [name], NP  QUEtiapine (SEROQUEL) 100 MG tablet Take 400 mg by mouth at bedtime.     [provider]  topiramate (TOPAMAX) 25 MG tablet Take 1 tablet (25 mg total) by mouth 2 (two) times daily. Please allow patient to pick up 30 day supply for free 07/13/18 08/12/18  [name], NP  TRUEPLUS LANCETS 28G MISC Use as instructed. Monitor blood glucose levels twice per day 12/02/17   [name], NP    Physical Exam:  Constitutional: Barely arousable with deep stimulation breathing comfortably not snoring no evidence of respiratory distress Vitals:   08/06/18 1000 08/06/18 1045 08/06/18 1145 08/06/18 1200  BP: (!) 142/93 112/67 132/87 129/79  Pulse:  93 73 81  Resp: (!) 32 19 20 (!) 27  Temp:      TempSrc:      SpO2:  100% 100% 100%  Weight:      Height:       Eyes: PERRL, lids and conjunctivae pale ENMT: Mucous membranes are dry posterior pharynx clear of any exudate or lesions.Normal dentition.  Neck: normal, supple, no masses, no thyromegaly Respiratory: clear to auscultation bilaterally, no wheezing, no crackles.  Normal respiratory effort. No accessory muscle use.  Cardiovascular: Regular rate and rhythm, no murmurs / rubs / gallops. No extremity edema. 2+ pedal pulses. No carotid bruits.  Abdomen: no tenderness, no masses palpated. No hepatosplenomegaly. Bowel sounds positive.  Musculoskeletal: no clubbing / cyanosis. No joint deformity upper and lower extremities. Good ROM, no contractures. Normal muscle tone.  Skin: no rashes, lesions, ulcers. No induration Neurologic: CN 2-12 grossly intact. Sensation intact, DTR normal. Strength 5/5 in all 4.  Psychiatric: Normal judgment and insight. Alert and oriented x 3. Normal mood.    Labs on Admission: I have personally reviewed following labs and imaging studies  CBC: Recent Labs  Lab 08/03/18 0511 08/03/18 0608 08/04/18 0345 08/06/18 0604  WBC 9.9  --  10.1 8.7  NEUTROABS 5.4  --   --   --   HGB 13.8 16.0* 12.5 12.9  HCT 42.2 47.0* 38.1 39.9  MCV 87.0  --  86.6 88.7  PLT 338  --  345 086   Basic Metabolic Panel: Recent Labs  Lab 08/03/18 0511 08/03/18 0608 08/03/18 1235 08/03/18 1720 08/04/18 0345 08/05/18 0334 08/06/18 0604  NA 140 142 141  --  141 141 139  K 3.5 3.6 3.4*  --  3.3* 3.5 3.7  CL 106  --  108  --  113* 113* 108  CO2 20*  --  21*  --  21* 21* 19*  GLUCOSE 330*  --  345*  --  173* 238* 291*  BUN 6  --  6  --  5* 9 7  CREATININE 1.42*  --  1.82*  --  1.33* 1.08* 1.52*  CALCIUM 8.8*  --  8.5*  --  8.2* 8.6* 9.1  MG  --   --   --  1.5*  --   --   --    GFR: Estimated Creatinine Clearance: 62.6 mL/min (A) (by C-G formula based on SCr of 1.52 mg/dL (H)). Liver Function Tests: Recent Labs  Lab 08/03/18 0511  AST 14*  ALT 12  ALKPHOS 63  BILITOT 0.4  PROT 6.7  ALBUMIN  3.9   Recent Labs  Lab 08/06/18 0604  LIPASE 25   Cardiac Enzymes: Recent Labs  Lab 08/03/18 1720  CKTOTAL 41   CBG: Recent Labs  Lab 08/04/18 1659 08/04/18 2123 08/05/18 0616 08/05/18 1147 08/06/18 0622  GLUCAP 208* 128* 212* 131*  236*   Urine analysis:    Component Value Date/Time   COLORURINE STRAW (A) 08/06/2018 0901   APPEARANCEUR CLEAR 08/06/2018 0901   LABSPEC 1.008 08/06/2018 0901   PHURINE 6.0 08/06/2018 0901   GLUCOSEU >=500 (A) 08/06/2018 0901   HGBUR MODERATE (A) 08/06/2018 0901   BILIRUBINUR NEGATIVE 08/06/2018 0901   BILIRUBINUR negative 07/13/2018 1651   KETONESUR 20 (A) 08/06/2018 0901   PROTEINUR NEGATIVE 08/06/2018 0901   UROBILINOGEN 0.2 07/13/2018 1651   UROBILINOGEN 1.0 07/09/2017 1324   NITRITE NEGATIVE 08/06/2018 0901   LEUKOCYTESUR NEGATIVE 08/06/2018 0901    Radiological Exams on Admission: Dg Chest 2 View  Result Date: 08/06/2018 CLINICAL DATA:  Seizures. EXAM: CHEST - 2 VIEW COMPARISON:  08/03/2018. FINDINGS: Mediastinum hilar structures normal. Heart size normal. Low lung volumes. No focal alveolar infiltrate. No pleural effusion or pneumothorax. No acute bony abnormality. IMPRESSION: Low lung volumes.  No acute cardiopulmonary disease identified. Electronically Signed   By: Marcello Moores  Register   On: 08/06/2018 06:24   Dg Thoracic Spine 2 View  Result Date: 08/06/2018 CLINICAL DATA:  Pt to ED for seizures. Per EMS, pt is having chest and abdominal pain. Pt completley non-responsive. Best obtainable imaging due to pt's condition EXAM: THORACIC SPINE 2 VIEWS COMPARISON:  None. FINDINGS: There is no evidence of thoracic spine fracture. Alignment is normal. No other significant bone abnormalities are identified. IMPRESSION: Negative. Electronically Signed   By: Lucrezia Europe M.D.   On: 08/06/2018 08:53   Dg Lumbar Spine Complete  Result Date: 08/06/2018 CLINICAL DATA:  Pt to ED for seizures. Per EMS, pt is having chest and abdominal pain. Pt completley non-responsive. Best obtainable imaging due to pt's condition EXAM: LUMBAR SPINE - COMPLETE 4+ VIEW COMPARISON:  None. FINDINGS: There is no evidence of lumbar spine fracture. Alignment is normal. Intervertebral disc spaces are maintained.  IMPRESSION: Negative. Electronically Signed   By: Lucrezia Europe M.D.   On: 08/06/2018 08:54   Ct Head Wo Contrast  Result Date: 08/06/2018 CLINICAL DATA:  Head trauma EXAM: CT HEAD WITHOUT CONTRAST CT CERVICAL SPINE WITHOUT CONTRAST TECHNIQUE: Multidetector CT imaging of the head and cervical spine was performed following the standard protocol without intravenous contrast. Multiplanar CT image reconstructions of the cervical spine were also generated. COMPARISON:  08/03/2018 FINDINGS: CT HEAD FINDINGS Brain: No evidence of acute infarction, hemorrhage, hydrocephalus, extra-axial collection or mass lesion/mass effect. Vascular: No hyperdense vessel or unexpected calcification. Skull: Normal. Negative for fracture or focal lesion. Sinuses/Orbits: No acute finding. Other: None. CT CERVICAL SPINE FINDINGS Alignment: Normal. Skull base and vertebrae: No acute fracture. No primary bone lesion or focal pathologic process. Soft tissues and spinal canal: No prevertebral fluid or swelling. No visible canal hematoma. Disc levels:  Intact. Upper chest: Negative. Other: None. IMPRESSION: 1.  No acute intracranial pathology. 2.  No fracture or static subluxation of the cervical spine. Electronically Signed   By: Eddie Candle M.D.   On: 08/06/2018 08:54   Ct Cervical Spine Wo Contrast  Result Date: 08/06/2018 CLINICAL DATA:  Head trauma EXAM: CT HEAD WITHOUT CONTRAST CT CERVICAL SPINE WITHOUT CONTRAST TECHNIQUE: Multidetector CT imaging of the head and cervical spine was performed following the standard protocol without  intravenous contrast. Multiplanar CT image reconstructions of the cervical spine were also generated. COMPARISON:  08/03/2018 FINDINGS: CT HEAD FINDINGS Brain: No evidence of acute infarction, hemorrhage, hydrocephalus, extra-axial collection or mass lesion/mass effect. Vascular: No hyperdense vessel or unexpected calcification. Skull: Normal. Negative for fracture or focal lesion. Sinuses/Orbits: No acute  finding. Other: None. CT CERVICAL SPINE FINDINGS Alignment: Normal. Skull base and vertebrae: No acute fracture. No primary bone lesion or focal pathologic process. Soft tissues and spinal canal: No prevertebral fluid or swelling. No visible canal hematoma. Disc levels:  Intact. Upper chest: Negative. Other: None. IMPRESSION: 1.  No acute intracranial pathology. 2.  No fracture or static subluxation of the cervical spine. Electronically Signed   By: Eddie Candle M.D.   On: 08/06/2018 08:54    EKG: Independently reviewed. Sinus rhythm Borderline low voltage, extremity leads ST elev, probable normal early repol pattern Since last tracing rate slower c/w 08/03/18   Assessment/Plan Principal Problem:   Uncontrolled seizures (HCC) Active Problems:   Diabetes type 2, uncontrolled ([place])   Obesity   Hypertension   Non compliance with medical treatment   Depression with anxiety    1.  Uncontrolled seizures: Etiology is quite unclear.  May be related to elevated blood glucoses but glucoses running in the mid to low 200s does not exactly suggest extremely poorly controlled diabetes which is where she has been recently.  Also there is a concern that there may be a focus on an MRI.  Will await further evaluation and comment by neurology.  Will admit patient to a stepdown unit where she can have continuous EEG monitoring if ordered.  2.  Diabetes type 2 uncontrolled we will place patient on sliding scale insulin coverage hold metformin if receiving it.  3.  Obesity: Noted weight loss is desired but I doubt would be possible in this patient with developmental abnormalities.  4.  Hypertension: We will continue home propranolol.  5.  Noncompliance with medical treatment: Suspect patient really has issues with her own capability in order to do so I do not think this is volitional.  6.  Depression with anxiety: Continue home medication regimen which includes fluoxetine.  DVT prophylaxis: Lovenox Code  Status: Full code Family Communication: No family present at the time of admission. Disposition Plan: Likely home in 2 to 3 days Consults called: Dr. Cheral Marker from neurology Admission status: Admit - It is my clinical opinion that admission to INPATIENT is reasonable and necessary because of the expectation that this patient will require hospital care that crosses at least 2 midnights to treat this condition based on the medical complexity of the problems presented.  Given the aforementioned information, the predictability of an adverse outcome is felt to be significant.    Lady Deutscher MD FACP [facility] Pager [phone]  How to contact the [facility] Attending or Consulting provider [place] or covering provider during after hours [place], for this patient?  1. Check the care team in [facility] and look for a) attending/consulting [facility] provider listed and b) the [facility] team listed 2. Log into [email or web address] and use [place]'s universal password to access. If you do not have the password, please contact the hospital operator. 3. Locate the [facility] provider you are looking for under [facility] and page to a number that you can be directly reached. 4. If you still have difficulty reaching the provider, please page the [facility] (Director on Call) for the Hospitalists listed on amion for assistance.  If 7PM-7AM, please contact night-coverage [email or web address] Password [facility]  08/06/2018, 12:48 PM

## 2018-08-06 NOTE — Progress Notes (Addendum)
Paged NP Bodenheimer at 2031. Pt. Complain left chest, mid and upper abdominal pain and nausea. 9/10. Can not give more PRN Tylenol until 2150. Ordered Toradol given.   During medication administration/assessment, patient able to state name and DOB, nods and gestures appropriately, follows commands. When shaking legs inquired if doing so because of pain, patient stated yes.  2319 reassessment patient inquired if she could have something to eat. Informed no food due to concerns for seizure and aspiration. Took all oral meds without difficulty with sips of H2O. Confirmed shaking legs because of pain.  Paged NP Bodenheimer at 2330. CBG 112, scheduled 20 units Lantus, patient NPO. Give all 20?Still complaining of 10/10 chest pain,declined PRN tylenol.Received call from 2332 NP Bodenheimer, indicated to hold Lantus and will order pain meds. Vicodin ordered.   Patient drowsy, arousable, during attempt administer Vicodin. Vicodin held.  Paged MD A. Arora at 0100 requesting callback.  Upon callback informed Rory Percy administered oral meds including Topamax, Keppra and Gabapentin to patient prior to reading neuro note by MD Lindzen that indicated to hold meds; meds still scheduled in [facility]. Informed NPO order, however, was informed per handoff sips with meds. Meds had not been discontinued in [facility] thus given. Rory Percy indicated prior med administration was ok and will now discontinue orders for meds and will continue to monitor patient for seizure activity even with meds given.   During patient care at approx 0140 patient inquired about something to eat. Informed NPO because at risk for choking, aspiration due to possible seizure activity.   CBG 86 at 0435, 220 mL ginger ale given to avoid hypoglycemia. Patient sttated she was hungry. Informed patient care team/physicians will have to assess in AM if patient can eat, cannot provide food at this time. Patient able to use arms to drink ginger ale from can.   No seizure  like activity observed during night. Inconsistencies in neuro assessments as patient would indicate inability to lift arms/legs, however, observed patient able to cross and uncross legs and able to hold ginger ale to drink and gently handle cellphone. Patient not track finger upon command, however, observed patient tracking movements with eyes when approached by RN.

## 2018-08-06 NOTE — Progress Notes (Addendum)
Patient trasfered from ED to [phone] via stretcher; alert and oriented x 2;  complaints of abdomen and chest pain; IV saline locked in LAC running fluids @75  ml/hr; skin intact (bruises on left hip and left upper arm; laceration on chin ) Orient patient to room and unit; gave patient care guide; instructed how to use the call bell and  fall risk precautions; patient keep saying "I don't know" to each question. Will continue to monitor the patient.

## 2018-08-06 NOTE — ED Notes (Signed)
Pt is now alert and oriented x4. Pt states she does not remember what happened earlier or how she got here

## 2018-08-06 NOTE — Progress Notes (Signed)
LTM started; tested event button. Nurse to notify EEG lab when pt moved to floor.

## 2018-08-06 NOTE — ED Provider Notes (Signed)
[facility] EMERGENCY DEPARTMENT Provider Note   CSN: [id number] Arrival date & time: 08/06/18  3016    History   Chief Complaint Chief Complaint  Patient presents with   Seizures    HPI Nancy [name] is a 18 y.o. female.     HPI Patient presents to the ED for evaluation of recurrent seizures.  Patient was recently admitted to the hospital on July 28 after having recurrent seizures that day.  Patient was discharged yesterday.  Patient returns to the ED today after having a seizure in the evening.  Patient called EMS.  She told him she fell and hit her head.  She also complained of having abdominal pain and chest pain to the nurse.  My evaluation the patient complained pain in her chest.  She cannot tell me the details surrounding her seizure.  She is complaining of difficulty moving her arms and legs as well. 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[place])    Premature baby    Seizures ([place])    Type II diabetes mellitus ([place])    insulin dependant    Patient Active Problem List   Diagnosis Date Noted   Hypokalemia 08/04/2018   Hypomagnesemia 08/04/2018   Acute kidney injury ([place]) 08/04/2018   Seizure ([place]) 08/03/2018   Depression with anxiety 07/25/2018   DKA (diabetic ketoacidoses) ([place]) 07/25/2018   Prolonged QT interval 07/25/2018   Seizures ([place]) 06/14/2018   Syncope 01/30/2018   Orthostatic hypotension 01/24/2018   DKA (diabetic ketoacidosis) ([place]) 01/24/2018   Chronic abdominal pain 12/24/2017   Sinus tachycardia by electrocardiogram    Acute lower UTI    Uncontrolled type 2 diabetes mellitus with hyperglycemia ([place])    Chest pain 12/19/2017   Generalized abdominal pain  08/21/2017   Non compliance with medical treatment 01/27/2012   Adjustment disorder 09/16/2011   DM (diabetes mellitus), type 1 ([place]) 09/15/2011   Dyspepsia    Acanthosis nigricans    Goiter    Obesity 06/14/2010   Hypertension 06/14/2010    Past Surgical History:  Procedure Laterality Date   ABDOMINAL HERNIA REPAIR     "I was a baby"   [place] EXTRACTION  2017     OB History   No obstetric history on file.      Home Medications    Prior to Admission medications   Medication Sig Start Date End Date Taking? Authorizing Provider  atorvastatin (LIPITOR) 20 MG tablet Take 1 tablet (20 mg total) by mouth daily. 07/26/18 10/24/18  Mercy Riding, MD  Blood Glucose Monitoring Suppl (TRUE METRIX METER) w/Device KIT Use as instructed. Monitor blood glucose levels twice per day 12/02/17   [name], NP  diclofenac sodium (VOLTAREN) 1 % GEL Apply 2 g topically 3 (three) times daily as needed (as directed to painful sites).     [provider]  FLUoxetine (PROZAC) 20 MG capsule Take 1 capsule (20 mg total) by mouth at bedtime. Please allow patient to pick up 30 day supply for free 07/13/18 08/12/18  [name], NP  gabapentin (NEURONTIN) 300 MG capsule Take 1 capsule (300 mg total) by mouth at bedtime for 30 days. 06/02/18 07/05/27  [name], NP  glucagon 1 MG injection Use for Severe Hypoglycemia . Inject 1 mg intramuscularly if unresponsive, unable to swallow, unconscious and/or has seizure Patient taking differently: Inject 1 mg into the muscle once as needed (for severe hypoglycemia- if unresponsive, unable to swallow, unconscious, and/or has had seizure(s)).  07/20/13 12/19/25  Renato Shin, MD  glucose blood (TRUE METRIX BLOOD GLUCOSE TEST) test strip Use as instructed 02/15/18   [name], NP  insulin glargine (LANTUS) 100 UNIT/ML injection Inject 0.2 mLs (20 Units total) into the skin  2 (two) times daily. 07/26/18   Mercy Riding, MD  insulin lispro (HUMALOG) 100 UNIT/ML injection Inject 0-0.15 mLs (0-15 Units total) into the skin 4 (four) times daily -  with meals and at bedtime. Patient taking differently: Inject 0-15 Units into the skin 4 (four) times daily -  with meals and at bedtime. Sliding scale 07/26/18 08/25/18  Mercy Riding, MD  Insulin Pen Needle (B-D UF III MINI PEN NEEDLES) 31G X 5 MM MISC Use as instructed. Monitor blood glucose levels twice per day 02/15/18   [name], NP  levETIRAcetam (KEPPRA) 500 MG tablet Take 2 tablets (1,000 mg total) by mouth 2 (two) times daily. 08/03/18 09/02/18  Curatolo, Adam, DO  meloxicam (MOBIC) 7.5 MG tablet Take 7.5 mg by mouth daily.    [provider]  promethazine (PHENERGAN) 25 MG tablet Take 1 tablet (25 mg total) by mouth every 8 (eight) hours as needed for nausea or vomiting. 07/29/18   Ripley Fraise, MD  propranolol (INDERAL) 20 MG tablet Take 1 tablet (20 mg total) by mouth 2 (two) times daily. Please allow patient to pick up 30 day supply for free 07/13/18 08/12/18  [name], NP  QUEtiapine (SEROQUEL) 100 MG tablet Take 400 mg by mouth at bedtime.     [provider]  topiramate (TOPAMAX) 25 MG tablet Take 1 tablet (25 mg total) by mouth 2 (two) times daily. Please allow patient to pick up 30 day supply for free 07/13/18 08/12/18  [name], NP  TRUEPLUS LANCETS 28G MISC Use as instructed. Monitor blood glucose levels twice per day 12/02/17   [name], NP    Family History Family History  Problem Relation Age of Onset   Diabetes Mother    Hypertension Mother    Obesity Mother    Asthma Mother    Allergic rhinitis Mother    Eczema Mother    Diabetes Father    Hypertension Father    Obesity Father    Hyperlipidemia Father    Hypertension Paternal Aunt    Hypertension Maternal Grandfather    Colon cancer Maternal Grandfather    Diabetes Paternal Grandmother     Obesity Paternal Grandmother    Diabetes Paternal Grandfather    Obesity Paternal Grandfather    Angioedema Neg Hx    Immunodeficiency Neg Hx    Urticaria Neg Hx    Stomach cancer Neg Hx    Esophageal cancer Neg Hx     Social History Social History   Tobacco Use   Smoking status: Never Smoker   Smokeless tobacco: Never Used  Substance Use Topics   Alcohol use: No    Alcohol/week: 0.0 standard drinks   Drug use: No     Allergies   Ibuprofen   Review of Systems Review  of Systems  All other systems reviewed and are negative.    Physical Exam Updated Vital Signs BP 112/67    Pulse 93    Temp 97.9 F (36.6 C) (Oral)    Resp 19    Ht 1.6 m (5' 3" )    Wt 89.3 kg    LMP 08/05/2018    SpO2 100%    BMI 34.87 kg/m   Physical Exam Vitals signs and nursing note reviewed.  Constitutional:      General: She is not in acute distress.    Appearance: She is well-developed.  HENT:     Head: Normocephalic and atraumatic.     Right Ear: External ear normal.     Left Ear: External ear normal.  Eyes:     General: No scleral icterus.       Right eye: No discharge.        Left eye: No discharge.     Conjunctiva/sclera: Conjunctivae normal.  Neck:     Musculoskeletal: Neck supple.     Trachea: No tracheal deviation.  Cardiovascular:     Rate and Rhythm: Normal rate and regular rhythm.  Pulmonary:     Effort: Pulmonary effort is normal. No respiratory distress.     Breath sounds: Normal breath sounds. No stridor. No wheezing or rales.  Abdominal:     General: Bowel sounds are normal. There is no distension.     Palpations: Abdomen is soft.     Tenderness: There is no abdominal tenderness. There is no guarding or rebound.  Musculoskeletal:        General: No tenderness.  Skin:    General: Skin is warm and dry.     Findings: No rash.  Neurological:     Mental Status: She is alert.     Cranial Nerves: No cranial nerve deficit (no facial droop, extraocular  movements intact, no slurred speech).     Sensory: Sensory deficit present.     Motor: Weakness present. No abnormal muscle tone or seizure activity.     Coordination: Coordination normal.     Comments: Patient is alert and awake, she answers questions appropriately, she states she is unable to move either her arms or legs, denies any sensation in her arms or legs      ED Treatments / Results  Labs (all labs ordered are listed, but only abnormal results are displayed) Labs Reviewed  BASIC METABOLIC PANEL - Abnormal; Notable for the following components:      Result Value   CO2 19 (*)    Glucose, Bld 291 (*)    Creatinine, Ser 1.52 (*)    GFR calc non Af Amer 49 (*)    GFR calc Af Amer 57 (*)    All other components within normal limits  URINALYSIS, ROUTINE W REFLEX MICROSCOPIC - Abnormal; Notable for the following components:   Color, Urine STRAW (*)    Glucose, UA >=500 (*)    Hgb urine dipstick MODERATE (*)    Ketones, ur 20 (*)    Bacteria, UA RARE (*)    All other components within normal limits  CBG MONITORING, ED - Abnormal; Notable for the following components:   Glucose-Capillary 236 (*)    All other components within normal limits  CBC  LIPASE, BLOOD  HEPATIC FUNCTION PANEL  I-STAT BETA HCG BLOOD, ED ([facility], [facility], [facility] ONLY)  TROPONIN I (HIGH SENSITIVITY)  TROPONIN I (HIGH SENSITIVITY)    EKG EKG Interpretation  Date/Time:  Friday August 06 2018 08:45:07 EDT Ventricular Rate:  86 PR Interval:    QRS Duration: 84 QT Interval:  364 QTC Calculation: 436 R Axis:   40 Text Interpretation:  Sinus rhythm Borderline low voltage, extremity leads ST elev, probable normal early repol pattern Since last tracing rate slower Confirmed by Dorie Rank [phone]) on 08/06/2018 11:50:49 AM   Radiology Dg Chest 2 View  Result Date: 08/06/2018 CLINICAL DATA:  Seizures. EXAM: CHEST - 2 VIEW COMPARISON:  08/03/2018. FINDINGS: Mediastinum hilar structures normal. Heart size normal. Low  lung volumes. No focal alveolar infiltrate. No pleural effusion or pneumothorax. No acute bony abnormality. IMPRESSION: Low lung volumes.  No acute cardiopulmonary disease identified. Electronically Signed   By: Marcello Moores  Register   On: 08/06/2018 06:24   Dg Thoracic Spine 2 View  Result Date: 08/06/2018 CLINICAL DATA:  Pt to ED for seizures. Per EMS, pt is having chest and abdominal pain. Pt completley non-responsive. Best obtainable imaging due to pt's condition EXAM: THORACIC SPINE 2 VIEWS COMPARISON:  None. FINDINGS: There is no evidence of thoracic spine fracture. Alignment is normal. No other significant bone abnormalities are identified. IMPRESSION: Negative. Electronically Signed   By: Lucrezia Europe M.D.   On: 08/06/2018 08:53   Dg Lumbar Spine Complete  Result Date: 08/06/2018 CLINICAL DATA:  Pt to ED for seizures. Per EMS, pt is having chest and abdominal pain. Pt completley non-responsive. Best obtainable imaging due to pt's condition EXAM: LUMBAR SPINE - COMPLETE 4+ VIEW COMPARISON:  None. FINDINGS: There is no evidence of lumbar spine fracture. Alignment is normal. Intervertebral disc spaces are maintained. IMPRESSION: Negative. Electronically Signed   By: Lucrezia Europe M.D.   On: 08/06/2018 08:54   Ct Head Wo Contrast  Result Date: 08/06/2018 CLINICAL DATA:  Head trauma EXAM: CT HEAD WITHOUT CONTRAST CT CERVICAL SPINE WITHOUT CONTRAST TECHNIQUE: Multidetector CT imaging of the head and cervical spine was performed following the standard protocol without intravenous contrast. Multiplanar CT image reconstructions of the cervical spine were also generated. COMPARISON:  08/03/2018 FINDINGS: CT HEAD FINDINGS Brain: No evidence of acute infarction, hemorrhage, hydrocephalus, extra-axial collection or mass lesion/mass effect. Vascular: No hyperdense vessel or unexpected calcification. Skull: Normal. Negative for fracture or focal lesion. Sinuses/Orbits: No acute finding. Other: None. CT CERVICAL SPINE  FINDINGS Alignment: Normal. Skull base and vertebrae: No acute fracture. No primary bone lesion or focal pathologic process. Soft tissues and spinal canal: No prevertebral fluid or swelling. No visible canal hematoma. Disc levels:  Intact. Upper chest: Negative. Other: None. IMPRESSION: 1.  No acute intracranial pathology. 2.  No fracture or static subluxation of the cervical spine. Electronically Signed   By: Eddie Candle M.D.   On: 08/06/2018 08:54   Ct Cervical Spine Wo Contrast  Result Date: 08/06/2018 CLINICAL DATA:  Head trauma EXAM: CT HEAD WITHOUT CONTRAST CT CERVICAL SPINE WITHOUT CONTRAST TECHNIQUE: Multidetector CT imaging of the head and cervical spine was performed following the standard protocol without intravenous contrast. Multiplanar CT image reconstructions of the cervical spine were also generated. COMPARISON:  08/03/2018 FINDINGS: CT HEAD FINDINGS Brain: No evidence of acute infarction, hemorrhage, hydrocephalus, extra-axial collection or mass lesion/mass effect. Vascular: No hyperdense vessel or unexpected calcification. Skull: Normal. Negative for fracture or focal lesion. Sinuses/Orbits: No acute finding. Other: None. CT CERVICAL SPINE FINDINGS Alignment: Normal. Skull base and vertebrae: No acute fracture. No primary bone lesion or focal pathologic process. Soft tissues and spinal canal: No prevertebral fluid or swelling. No visible canal hematoma. Disc levels:  Intact. Upper chest: Negative. Other: None. IMPRESSION: 1.  No acute intracranial pathology. 2.  No fracture or static subluxation of the cervical spine. Electronically Signed   By: Eddie Candle M.D.   On: 08/06/2018 08:54    Procedures Procedures (including critical care time)  Medications Ordered in ED Medications  sodium chloride 0.9 % bolus 1,000 mL (0 mLs Intravenous Stopped 08/06/18 1142)    Followed by  0.9 %  sodium chloride infusion (1,000 mLs Intravenous New Bag/Given 08/06/18 1011)  ketorolac (TORADOL) 30 MG/ML  injection 15 mg (15 mg Intravenous Given 08/06/18 1008)     Initial Impression / Assessment and Plan / ED Course  I have reviewed the triage vital signs and the nursing notes.  Pertinent labs & imaging results that were available during my care of the patient were reviewed by me and considered in my medical decision making (see chart for details).  Clinical Course as of Aug 05 1200  Fri Aug 06, 2018  0911 CBC is normal.  Electrolyte panel shows hyperglycemia with a slight decrease in the bicarb but no elevation in anion gap.  Will give IV fluids   [JK]  0912 X-ray findings reviewed.  No abnormalities on the head CT and no signs of fracture.   [JK]  [id number] Patient is now moving all her extremities and has normal strength and sensation.  C-collar removed.   [JK]  [id number] Imaging tests reviewed.  Patient did have a CT scan of her abdomen and pelvis on July 19.Previous   [JK]  37 Notified by RN that pt had a seizure. I arrived at the bedside.  She is unresponsive.  ? Post ictal vs volitional.     [JK]  1201 D/w Dr Cheral Marker.  Will evaluate pt.  Will need long term monitoring.  I will consult medical service.   [JK]    Clinical Course User Index [JK] Dorie Rank, MD     Patient presented to the ED with complaints of recurrent seizure-like activity.  When I first evaluated the patient patient indicated that she was not able to move her arms and legs.  After a brief period of time this all returned back to normal and the patient began pulling complaining of more chest and abdominal pain.  Her laboratory tests are unremarkable with the exception of mild hyperglycemia and a slight decrease in her bicarb.  No findings to suggest DKA however with a normal anion gap.  While in the ED the patient had a recurrent episode of seizure activity witnessed by nursing staff.  I was not at the bedside when this occurred.  On my arrival she remained unresponsive.  According to the records patient states she has  multiple seizures per day.  Question whether these are true epileptic seizures versus nonepileptic seizure-like activity.  Patient was just admitted to the hospital and discharged.  I will consult with neurology to discuss whether she should be monitored and have further EEG testing.    Final Clinical Impressions(s) / ED Diagnoses   Final diagnoses:  Seizure-like activity [facility])       Dorie Rank, MD 08/06/18 1203

## 2018-08-06 NOTE — Progress Notes (Signed)
Inpatient Diabetes Program Recommendations  AACE/ADA: New Consensus Statement on Inpatient Glycemic Control (2015)  Target Ranges:  Prepandial:   less than 140 mg/dL      Peak postprandial:   less than 180 mg/dL (1-2 hours)      Critically ill patients:  140 - 180 mg/dL   Lab Results  Component Value Date   GLUCAP 236 (H) 08/06/2018   HGBA1C 12.9 (A) 07/13/2018    Review of Glycemic Control  Diabetes history: DM 1 Outpatient Diabetes medications: Lantus 20 units bid, Humalog 0-15 units 4x/day Current orders for Inpatient glycemic control: None in ED  Inpatient Diabetes Program Recommendations:    Consider Novolog 0-15 units Q4 hours while in ED if admitted will need to add her basal insulin as she has a history of DM type 1.  Thanks,  Tama Headings RN, MSN, BC-ADM Inpatient Diabetes Coordinator Team Pager [phone] (8a-5p)

## 2018-08-06 NOTE — Consult Note (Addendum)
NEURO HOSPITALIST CONSULT NOTE   Requestig physician: Dr. Tomi Bamberger  Reason for Consult: "seizure-like behavior "  History obtained from:  Patient, Mother and Chart  HPI:                                                                                                                                          Nancy [name] is an 18 y.o. female with a PMHx notable for insulin-dependent diabetes mellitus, CHF, chronic pain, depression and anxiety, as well as possible seizure disorder who presents to the ER for recurrent seizure-like episode following a recent discharge for the same.  The patient states that she does not recall any other events of the past 2 days since her discharge.  The last thing she remembers was being discharged from the hospital a few days prior.  When questioned as to whether or not the patient had lost bowel or bladder control, she states bladder control was lost.  When questioned regarding tongue biting she states that she bit her tongue severely and that it bled (there is no notable trauma to the tongue).   Per Mother: Patient returned from hospital the prior early afternoon. She stated that she felt unwell on arrival with chest discomfort and nonspecific abdominal discomfort. She had reportedly taken her Keppra. She was able to eat lunch and dinner the prior day. Per mother the patient seemed distant and had a mild stuttering but no physical weakness. Patient has been acting this way since the beginning of the month. Her mother stated that when she entered her room around 3am after hearing a noise she was noted to be lying on the floor. She did not lose bowel or bladder control per mother. She did not reportedly bite her tongue. Mother was not aware of a prior event or episode. The mother stated that since her diagnosis with epilepsy she has not been acting herself. She denied recent traumatic events, trauma, MDD or anxiety.  Past Medical History:  Diagnosis  Date  . Acanthosis nigricans   . Anxiety   . CHF (congestive heart failure) ([place])   . Chronic lower back pain   . Depression   . Dyspepsia   . Obesity   . Ovarian cyst    pt is not aware of this hx (11/24/2017)  . Pre-diabetes   . 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 Premature baby   . Seizures ([place])   . Type II diabetes mellitus ([facility])    insulin dependant    Past Surgical History:  Procedure Laterality Date  . ABDOMINAL HERNIA REPAIR     "I was a baby"  . HERNIA REPAIR    . TONSILLECTOMY AND ADENOIDECTOMY    . WISDOM TOOTH EXTRACTION  2017    Family History  Problem  Relation Age of Onset  . Diabetes Mother   . Hypertension Mother   . Obesity Mother   . Asthma Mother   . Allergic rhinitis Mother   . Eczema Mother   . Diabetes Father   . Hypertension Father   . Obesity Father   . Hyperlipidemia Father   . Hypertension Paternal Aunt   . Hypertension Maternal Grandfather   . Colon cancer Maternal Grandfather   . Diabetes Paternal Grandmother   . Obesity Paternal Grandmother   . Diabetes Paternal Grandfather   . Obesity Paternal Grandfather   . Angioedema Neg Hx   . Immunodeficiency Neg Hx   . Urticaria Neg Hx   . Stomach cancer Neg Hx   . Esophageal cancer Neg Hx               Social History:  reports that she has never smoked. She has never used smokeless tobacco. She reports that she does not drink alcohol or use drugs.  Allergies  Allergen Reactions  . Ibuprofen Other (See Comments)    GI MD said to not take this anymore    MEDICATIONS:                                                                                                                     Prior to Admission:  No current facility-administered medications on file prior to encounter.    Current Outpatient Medications on File Prior to Encounter  Medication Sig Dispense Refill  . atorvastatin (LIPITOR) 20 MG tablet Take 1 tablet (20 mg total) by mouth daily. 90 tablet 0  . Blood Glucose Monitoring  Suppl (TRUE METRIX METER) w/Device KIT Use as instructed. Monitor blood glucose levels twice per day 1 kit 0  . diclofenac sodium (VOLTAREN) 1 % GEL Apply 2 g topically 3 (three) times daily as needed (as directed to painful sites).     Marland Kitchen FLUoxetine (PROZAC) 20 MG capsule Take 1 capsule (20 mg total) by mouth at bedtime. Please allow patient to pick up 30 day supply for free 30 capsule 4  . gabapentin (NEURONTIN) 300 MG capsule Take 1 capsule (300 mg total) by mouth at bedtime for 30 days. 30 capsule 1  . glucagon 1 MG injection Use for Severe Hypoglycemia . Inject 1 mg intramuscularly if unresponsive, unable to swallow, unconscious and/or has seizure (Patient taking differently: Inject 1 mg into the muscle once as needed (for severe hypoglycemia- if unresponsive, unable to swallow, unconscious, and/or has had seizure(s)). ) 2 each 5  . glucose blood (TRUE METRIX BLOOD GLUCOSE TEST) test strip Use as instructed 100 each 12  . insulin glargine (LANTUS) 100 UNIT/ML injection Inject 0.2 mLs (20 Units total) into the skin 2 (two) times daily. 20 mL 1  . insulin lispro (HUMALOG) 100 UNIT/ML injection Inject 0-0.15 mLs (0-15 Units total) into the skin 4 (four) times daily -  with meals and at bedtime. (Patient taking differently: Inject 0-15 Units into the skin 4 (four) times daily -  with meals and at bedtime. Sliding scale) 10 mL prn  . Insulin Pen Needle (B-D UF III MINI PEN NEEDLES) 31G X 5 MM MISC Use as instructed. Monitor blood glucose levels twice per day 90 each 1  . levETIRAcetam (KEPPRA) 500 MG tablet Take 2 tablets (1,000 mg total) by mouth 2 (two) times daily. 120 tablet 0  . meloxicam (MOBIC) 7.5 MG tablet Take 7.5 mg by mouth daily.    . promethazine (PHENERGAN) 25 MG tablet Take 1 tablet (25 mg total) by mouth every 8 (eight) hours as needed for nausea or vomiting. 15 tablet 0  . propranolol (INDERAL) 20 MG tablet Take 1 tablet (20 mg total) by mouth 2 (two) times daily. Please allow patient to  pick up 30 day supply for free 60 tablet 2  . QUEtiapine (SEROQUEL) 100 MG tablet Take 400 mg by mouth at bedtime.     . topiramate (TOPAMAX) 25 MG tablet Take 1 tablet (25 mg total) by mouth 2 (two) times daily. Please allow patient to pick up 30 day supply for free 60 tablet 2  . TRUEPLUS LANCETS 28G MISC Use as instructed. Monitor blood glucose levels twice per day 100 each 3      Scheduled: . enoxaparin (LOVENOX) injection  40 mg Subcutaneous Q24H  . insulin aspart  0-20 Units Subcutaneous Q4H   Continuous: . sodium chloride 1,000 mL (08/06/18 1011)  . sodium chloride 75 mL/hr (08/06/18 1330)   WOE:HOZYYQMGNOIBB **OR** acetaminophen, ondansetron **OR** ondansetron (ZOFRAN) IV, polyethylene glycol   ROS:                                                                                                                                       History obtained from the patient Review of Systems  Constitutional: Negative for chills, diaphoresis and fever.  HENT: Negative for ear pain and sinus pain.   Eyes: Negative for blurred vision, photophobia and redness.  Respiratory: Negative for cough and shortness of breath.   Cardiovascular: Positive for chest pain. Negative for leg swelling.  Gastrointestinal: Positive for abdominal pain. Negative for nausea and vomiting.  Genitourinary: Negative for flank pain and urgency.  Musculoskeletal: Negative for myalgias.  Neurological: Positive for speech change and seizures. Negative for dizziness, focal weakness, weakness and headaches.  Psychiatric/Behavioral: Negative for depression. The patient is not nervous/anxious.    Blood pressure 129/79, pulse 81, temperature 97.9 F (36.6 C), temperature source Oral, resp. rate (!) 27, height 5' 3" (1.6 m), weight 89.3 kg, last menstrual period 08/05/2018, SpO2 100 %.   General Examination:  Physical  Exam  HEENT-  Normocephalic, no lesions, without obvious abnormality.  Normal external eye and conjunctiva.   Cardiovascular- S1-S2 audible, pulses palpable throughout   Lungs-no rhonchi or wheezing noted, no excessive working breathing.  Saturations within normal limits Abdomen- All 4 quadrants palpated and nontender Extremities- Warm, dry and intact Musculoskeletal-no joint tenderness, deformity or swelling Skin-warm and dry, no hyperpigmentation, vitiligo, or suspicious lesions  Neurological Examination Mental Status: Alert, oriented x4, thought content appropriate.  Speech fluent without evidence of aphasia but patient slow to respond to questions or commands.  Able to follow 3 step commands without difficulty but requires frequent repetition of commands. Cranial Nerves: II:  Visual fields exam: patient questioned what MD meant on the MD's badge but missed all of the visual acuity examination tests bilaterally x 10 attempts. She stares straight ahead when specifically testing vision, but when distracted she is able to count fingers and make eye contact.  III,IV, VI: ptosis not present, extra-ocular motions intact bilaterally pupils equal, round, reactive to light and accommodation V,VII: smile symmetric, facial light touch sensation normal bilaterally VIII: hearing normal bilaterally IX,X: uvula rises symmetrically XI: bilateral shoulder shrug XII: midline tongue extension Motor:  Right : Upper extremity   5/5    Left:     Upper extremity   5/5  Lower extremity   5/5     Lower extremity   5/5 Tone and bulk:normal tone throughout; no atrophy noted Sensory: Pinprick and light touch intact throughout, bilaterally Deep Tendon Reflexes: 2+ and symmetric throughout Plantars: Right: downgoing   Left: downgoing Cerebellar: Patient initially missed her nose by a wide margin when testing FNF on the right on 4 attempts at the exam but after increasing the speed of the exam she exhibited no  abnormal findings with normal finger-to-nose, normal rapid alternating movements and normal heel-to-shin test Reflexes: 2+ throughout Gait: gait and station not performed as patient was lying in bed with seizure precautions    Lab Results: Basic Metabolic Panel: Recent Labs  Lab 08/03/18 0511 08/03/18 0608 08/03/18 1235 08/03/18 1720 08/04/18 0345 08/05/18 0334 08/06/18 0604  NA 140 142 141  --  141 141 139  K 3.5 3.6 3.4*  --  3.3* 3.5 3.7  CL 106  --  108  --  113* 113* 108  CO2 20*  --  21*  --  21* 21* 19*  GLUCOSE 330*  --  345*  --  173* 238* 291*  BUN 6  --  6  --  5* 9 7  CREATININE 1.42*  --  1.82*  --  1.33* 1.08* 1.52*  CALCIUM 8.8*  --  8.5*  --  8.2* 8.6* 9.1  MG  --   --   --  1.5*  --   --   --     CBC: Recent Labs  Lab 08/03/18 0511 08/03/18 0608 08/04/18 0345 08/06/18 0604  WBC 9.9  --  10.1 8.7  NEUTROABS 5.4  --   --   --   HGB 13.8 16.0* 12.5 12.9  HCT 42.2 47.0* 38.1 39.9  MCV 87.0  --  86.6 88.7  PLT 338  --  345 380    Cardiac Enzymes: Recent Labs  Lab 08/03/18 1720  CKTOTAL 41    Lipid Panel: No results for input(s): CHOL, TRIG, HDL, CHOLHDL, VLDL, LDLCALC in the last 168 hours.  Imaging: Dg Chest 2 View  Result Date: 08/06/2018 CLINICAL DATA:  Seizures. EXAM: CHEST - 2 VIEW  COMPARISON:  08/03/2018. FINDINGS: Mediastinum hilar structures normal. Heart size normal. Low lung volumes. No focal alveolar infiltrate. No pleural effusion or pneumothorax. No acute bony abnormality. IMPRESSION: Low lung volumes.  No acute cardiopulmonary disease identified. Electronically Signed   By: Marcello Moores  Register   On: 08/06/2018 06:24   Dg Thoracic Spine 2 View  Result Date: 08/06/2018 CLINICAL DATA:  Pt to ED for seizures. Per EMS, pt is having chest and abdominal pain. Pt completley non-responsive. Best obtainable imaging due to pt's condition EXAM: THORACIC SPINE 2 VIEWS COMPARISON:  None. FINDINGS: There is no evidence of thoracic spine fracture.  Alignment is normal. No other significant bone abnormalities are identified. IMPRESSION: Negative. Electronically Signed   By: Lucrezia Europe M.D.   On: 08/06/2018 08:53   Dg Lumbar Spine Complete  Result Date: 08/06/2018 CLINICAL DATA:  Pt to ED for seizures. Per EMS, pt is having chest and abdominal pain. Pt completley non-responsive. Best obtainable imaging due to pt's condition EXAM: LUMBAR SPINE - COMPLETE 4+ VIEW COMPARISON:  None. FINDINGS: There is no evidence of lumbar spine fracture. Alignment is normal. Intervertebral disc spaces are maintained. IMPRESSION: Negative. Electronically Signed   By: Lucrezia Europe M.D.   On: 08/06/2018 08:54   Ct Head Wo Contrast  Result Date: 08/06/2018 CLINICAL DATA:  Head trauma EXAM: CT HEAD WITHOUT CONTRAST CT CERVICAL SPINE WITHOUT CONTRAST TECHNIQUE: Multidetector CT imaging of the head and cervical spine was performed following the standard protocol without intravenous contrast. Multiplanar CT image reconstructions of the cervical spine were also generated. COMPARISON:  08/03/2018 FINDINGS: CT HEAD FINDINGS Brain: No evidence of acute infarction, hemorrhage, hydrocephalus, extra-axial collection or mass lesion/mass effect. Vascular: No hyperdense vessel or unexpected calcification. Skull: Normal. Negative for fracture or focal lesion. Sinuses/Orbits: No acute finding. Other: None. CT CERVICAL SPINE FINDINGS Alignment: Normal. Skull base and vertebrae: No acute fracture. No primary bone lesion or focal pathologic process. Soft tissues and spinal canal: No prevertebral fluid or swelling. No visible canal hematoma. Disc levels:  Intact. Upper chest: Negative. Other: None. IMPRESSION: 1.  No acute intracranial pathology. 2.  No fracture or static subluxation of the cervical spine. Electronically Signed   By: Eddie Candle M.D.   On: 08/06/2018 08:54   Ct Cervical Spine Wo Contrast  Result Date: 08/06/2018 CLINICAL DATA:  Head trauma EXAM: CT HEAD WITHOUT CONTRAST CT  CERVICAL SPINE WITHOUT CONTRAST TECHNIQUE: Multidetector CT imaging of the head and cervical spine was performed following the standard protocol without intravenous contrast. Multiplanar CT image reconstructions of the cervical spine were also generated. COMPARISON:  08/03/2018 FINDINGS: CT HEAD FINDINGS Brain: No evidence of acute infarction, hemorrhage, hydrocephalus, extra-axial collection or mass lesion/mass effect. Vascular: No hyperdense vessel or unexpected calcification. Skull: Normal. Negative for fracture or focal lesion. Sinuses/Orbits: No acute finding. Other: None. CT CERVICAL SPINE FINDINGS Alignment: Normal. Skull base and vertebrae: No acute fracture. No primary bone lesion or focal pathologic process. Soft tissues and spinal canal: No prevertebral fluid or swelling. No visible canal hematoma. Disc levels:  Intact. Upper chest: Negative. Other: None. IMPRESSION: 1.  No acute intracranial pathology. 2.  No fracture or static subluxation of the cervical spine. Electronically Signed   By: Eddie Candle M.D.   On: 08/06/2018 08:54    Assessment;  18 year old female with a past medical history notable for insulin-dependent diabetes mellitus, CHF, chronic pain, depression and anxiety, as well as possible seizure disorder who presents to the ER for recurrent seizure-like episode following a  recent discharge for the same.  Nursing staff witnessed the patient with seizure-like activity in the ED while waiting for placement. Per EDP, the activity appeared suggestive of possible pseudoseizure and she was not postictal afterwards. The patient stated that she had fallen and struck her head during a seizure overnight.  However, mother states the patient was found lying in the middle of her floor in her room unresponsive which prompted a call to 911. CT of the head and cervical spine were unremarkable.  Lumbar thoracic spine plain films were equally unremarkable.  Patient has several recent admissions for  seizures with this and the prior admission resulting in a seizure while in the waiting room of the ED. 1.  The patient's physical exam gives the appearance that she is intentionally not cooperating, demonstrating inconsistent deficits which resolve at times and may reoccur prior to again resolving. Also distractable. We are uncertain if this is an attempt to convince her medical team of a true underlying disorder or if there is factitious behavior. She is alert, oriented to self, location, reason for visit, date, time and recent events. She does not appear post-ictal.   2.  Patient's BMP with an elevated glucose of 291, CO2 slightly acidotic at 19, and serum creatinine elevated to 1.52 with a normal anion gap of 12.  UA shows low-level ketones at 20, which is consistent with decreased p.o. intake and diabetes.  Lipase normal at 25, beta-hCG negative, high sensitivity troponins negative x2.  Hepatic panel unremarkable with AST ALT of 15/13, alk phos 57 total bili 0.5. 3.  The patient is afebrile with normal vitals, no clear etiology for a seizure-inducing illness. She endorsed compliance with her medication stating that she has been taking 1031m BID of her Keppra since last discharge.  4.  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 Less likely epilepsy given her prior unremarkable EEG's and exam findings today without postictal state following spells.  Given the prior EEG, and the fact that in general all EEGs have a potential false negative value if no events captured, we will recommend LTM EEG off Keppra.  Per mother, the patient has demonstrated notable cognitive slowing since starting Keppra, and if epileptic seizures are captured, she likely will need to be started on an alternate anticonvulsant.   Recommendations: - Hold Ativan 4 mg, as patient does not appear to be in status - Hold antiepileptics (Keppra, Neurontin and Topamax) while LTM EEG is running  and observe mental status - LTM EEG ordered and leads are being placed - [facility] neurology on call team STAT for seizure-like events - Neurology will follow. -- Per [facility], patients with seizures are not allowed to drive until  they have been seizure-free for six months. Use caution when using heavy equipment or power tools. Avoid working on ladders or at heights. Take showers instead of baths. Ensure the water temperature is not too high on the home water heater. Do not go swimming alone. When caring for infants or small children, sit down when holding, feeding, or changing them to minimize risk of injury to the child in the event you have a seizure. Also, maintain good sleep hygiene. Avoid alcohol.   LKathi Ludwig MD C[facility] LuthervilleInternal Medicine, PGY-3 Pager # [phone] I have seen and examined the patient. I have formulated the assessment and recommendations. 18year old female with seizure versus pseudoseizure. Exam reveals multiple inconsistent deficits that do not appear likely to be  physiological. Plan is for LTM EEG off anticonvulsants.  Electronically signed: Dr. Kerney Elbe

## 2018-08-06 NOTE — ED Triage Notes (Addendum)
Pt c/o having seizures this evening x1. Also told EMS that seh fell and hit her head. Pt tells this nurse that she is having abdominal pain and chest pain.

## 2018-08-06 NOTE — ED Notes (Signed)
ED TO INPATIENT HANDOFF REPORT  ED Nurse Name and Phone #: Percival Spanishlana [phone]  S Name/Age/Gender Nancy [name] 18 y.o. female Room/Bed: 023C/023C  Code Status   Code Status: Full Code  Home/SNF/Other Home Patient oriented to: self, place, time and situation Is this baseline? Yes   Triage Complete: Triage complete  Chief Complaint seizures  Triage Note Pt c/o having seizures this evening x1. Also told EMS that seh fell and hit her head. Pt tells this nurse that she is having abdominal pain and chest pain.   Allergies Allergies  Allergen Reactions  . Ibuprofen Other (See Comments)    GI MD said to not take this anymore    Level of Care/Admitting Diagnosis ED Disposition    ED Disposition Condition Comment   Admit  [facility] [[id number]]  Level of Care: Progressive [102]  Covid Evaluation: Asymptomatic Screening Protocol (No Symptoms)  Diagnosis: Uncontrolled seizures [facility](HCC) [[id number]]  Admitting Physician: Lahoma CrockerSHEEHAN, THERESA C [[id number]]  Attending Physician: Lahoma CrockerSHEEHAN, THERESA C [[id number]]  Estimated length of stay: 3 - 4 days  Certification:: I certify this patient will need inpatient services for at least 2 midnights  PT Class (Do Not Modify): Inpatient [101]  PT Acc Code (Do Not Modify): Private [1]       B Medical/Surgery History Past Medical History:  Diagnosis Date  . Acanthosis nigricans   . Anxiety   . CHF (congestive heart failure) (HCC)   . Chronic lower back pain   . Depression   . Dyspepsia   . Obesity   . Ovarian cyst    pt is not aware of this hx (11/24/2017)  . Pre-diabetes   . 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 Premature baby   . Seizures (HCC)   . Type II diabetes mellitus (HCC)    insulin dependant   Past Surgical History:  Procedure Laterality Date  . ABDOMINAL HERNIA REPAIR     "I was a baby"  . HERNIA REPAIR    . TONSILLECTOMY AND ADENOIDECTOMY    . WISDOM TOOTH EXTRACTION  2017     A IV  Location/Drains/Wounds Patient Lines/Drains/Airways Status   Active Line/Drains/Airways    Name:   Placement date:   Placement time:   Site:   Days:   Peripheral IV 08/06/18 Left Antecubital   08/06/18    0604    Antecubital   less than 1          Intake/Output Last 24 hours No intake or output data in the 24 hours ending 08/06/18 1425  Labs/Imaging Results for orders placed or performed during the hospital encounter of 08/06/18 (from the past 48 hour(s))  Basic metabolic panel     Status: Abnormal   Collection Time: 08/06/18  6:04 AM  Result Value Ref Range   Sodium 139 135 - 145 mmol/L   Potassium 3.7 3.5 - 5.1 mmol/L   Chloride 108 98 - 111 mmol/L   CO2 19 (L) 22 - 32 mmol/L   Glucose, Bld 291 (H) 70 - 99 mg/dL   BUN 7 6 - 20 mg/dL   Creatinine, Ser 1.301.52 (H) 0.44 - 1.00 mg/dL   Calcium 9.1 8.9 - 86.510.3 mg/dL   GFR calc non Af Amer 49 (L) >60 mL/min   GFR calc Af Amer 57 (L) >60 mL/min   Anion gap 12 5 - 15    Comment: Performed at [facility], [address] [place], [place] [address]  CBC  Status: None   Collection Time: 08/06/18  6:04 AM  Result Value Ref Range   WBC 8.7 4.0 - 10.5 K/uL   RBC 4.50 3.87 - 5.11 MIL/uL   Hemoglobin 12.9 12.0 - 15.0 g/dL   HCT 39.9 36.0 - 46.0 %   MCV 88.7 80.0 - 100.0 fL   MCH 28.7 26.0 - 34.0 pg   MCHC 32.3 30.0 - 36.0 g/dL   RDW 13.4 11.5 - 15.5 %   Platelets 380 150 - 400 K/uL   nRBC 0.0 0.0 - 0.2 %    Comment: Performed at [facility], [place] [address] [place], [place] [address]  Troponin I (High Sensitivity)     Status: None   Collection Time: 08/06/18  6:04 AM  Result Value Ref Range   Troponin I (High Sensitivity) 2 <18 ng/L    Comment: (NOTE) Elevated high sensitivity troponin I (hsTnI) values and significant  changes across serial measurements may suggest ACS but many other  chronic and acute conditions are known to elevate hsTnI results.  Refer to the "Links" section for chest pain algorithms and  additional  guidance. Performed at [facility], [place] [address] [place], [place] [address]   Lipase, blood     Status: None   Collection Time: 08/06/18  6:04 AM  Result Value Ref Range   Lipase 25 11 - 51 U/L    Comment: Performed at [place] [address] [place], [place] [address]  CBG monitoring, ED     Status: Abnormal   Collection Time: 08/06/18  6:22 AM  Result Value Ref Range   Glucose-Capillary 236 (H) 70 - 99 mg/dL  I-Stat beta hCG blood, ED     Status: None   Collection Time: 08/06/18  7:40 AM  Result Value Ref Range   I-stat hCG, quantitative <5.0 <5 mIU/mL   Comment 3            Comment:   GEST. AGE      CONC.  (mIU/mL)   <=1 WEEK        5 - 50     2 WEEKS       50 - 500     3 WEEKS       100 - 10,000     4 WEEKS     1,000 - 30,000        FEMALE AND NON-PREGNANT FEMALE:     LESS THAN 5 mIU/mL   Troponin I (High Sensitivity)     Status: None   Collection Time: 08/06/18  8:04 AM  Result Value Ref Range   Troponin I (High Sensitivity) 3 <18 ng/L    Comment: (NOTE) Elevated high sensitivity troponin I (hsTnI) values and significant  changes across serial measurements may suggest ACS but many other  chronic and acute conditions are known to elevate hsTnI results.  Refer to the "Links" section for chest pain algorithms and additional  guidance. Performed at [facility], [place] [address] [place], [place] [address]   Urinalysis, Routine w reflex microscopic     Status: Abnormal   Collection Time: 08/06/18  9:01 AM  Result Value Ref Range   Color, Urine STRAW (A) YELLOW   APPearance CLEAR CLEAR   Specific Gravity, Urine 1.008 1.005 - 1.030   pH 6.0 5.0 - 8.0   Glucose, UA >=500 (A) NEGATIVE mg/dL   Hgb urine dipstick MODERATE (A) NEGATIVE   Bilirubin Urine NEGATIVE NEGATIVE  Ketones, ur 20 (A) NEGATIVE mg/dL   Protein, ur NEGATIVE NEGATIVE mg/dL   Nitrite NEGATIVE NEGATIVE   Leukocytes,Ua NEGATIVE NEGATIVE   RBC / HPF 0-5 0 - 5  RBC/hpf   WBC, UA 0-5 0 - 5 WBC/hpf   Bacteria, UA RARE (A) NONE SEEN   Squamous Epithelial / LPF 0-5 0 - 5   Mucus PRESENT     Comment: Performed at [facility], [address] [place], [place] [address]  Hepatic function panel     Status: None   Collection Time: 08/06/18 12:04 PM  Result Value Ref Range   Total Protein 6.7 6.5 - 8.1 g/dL   Albumin 3.8 3.5 - 5.0 g/dL   AST 15 15 - 41 U/L   ALT 13 0 - 44 U/L   Alkaline Phosphatase 57 38 - 126 U/L   Total Bilirubin 0.5 0.3 - 1.2 mg/dL   Bilirubin, Direct 0.1 0.0 - 0.2 mg/dL   Indirect Bilirubin 0.4 0.3 - 0.9 mg/dL    Comment: Performed at [facility], [address] [place], [place] [address]  CBG monitoring, ED     Status: Abnormal   Collection Time: 08/06/18  2:03 PM  Result Value Ref Range   Glucose-Capillary 175 (H) 70 - 99 mg/dL   Comment 1 Notify RN    Comment 2 Document in Chart    Dg Chest 2 View  Result Date: 08/06/2018 CLINICAL DATA:  Seizures. EXAM: CHEST - 2 VIEW COMPARISON:  08/03/2018. FINDINGS: Mediastinum hilar structures normal. Heart size normal. Low lung volumes. No focal alveolar infiltrate. No pleural effusion or pneumothorax. No acute bony abnormality. IMPRESSION: Low lung volumes.  No acute cardiopulmonary disease identified. Electronically Signed   By: Maisie Fushomas  Register   On: 08/06/2018 06:24   Dg Thoracic Spine 2 View  Result Date: 08/06/2018 CLINICAL DATA:  Pt to ED for seizures. Per EMS, pt is having chest and abdominal pain. Pt completley non-responsive. Best obtainable imaging due to pt's condition EXAM: THORACIC SPINE 2 VIEWS COMPARISON:  None. FINDINGS: There is no evidence of thoracic spine fracture. Alignment is normal. No other significant bone abnormalities are identified. IMPRESSION: Negative. Electronically Signed   By: Corlis Leak  Hassell M.D.   On: 08/06/2018 08:53   Dg Lumbar Spine Complete  Result Date: 08/06/2018 CLINICAL DATA:  Pt to ED for seizures. Per EMS, pt is having chest and  abdominal pain. Pt completley non-responsive. Best obtainable imaging due to pt's condition EXAM: LUMBAR SPINE - COMPLETE 4+ VIEW COMPARISON:  None. FINDINGS: There is no evidence of lumbar spine fracture. Alignment is normal. Intervertebral disc spaces are maintained. IMPRESSION: Negative. Electronically Signed   By: Corlis Leak  Hassell M.D.   On: 08/06/2018 08:54   Ct Head Wo Contrast  Result Date: 08/06/2018 CLINICAL DATA:  Head trauma EXAM: CT HEAD WITHOUT CONTRAST CT CERVICAL SPINE WITHOUT CONTRAST TECHNIQUE: Multidetector CT imaging of the head and cervical spine was performed following the standard protocol without intravenous contrast. Multiplanar CT image reconstructions of the cervical spine were also generated. COMPARISON:  08/03/2018 FINDINGS: CT HEAD FINDINGS Brain: No evidence of acute infarction, hemorrhage, hydrocephalus, extra-axial collection or mass lesion/mass effect. Vascular: No hyperdense vessel or unexpected calcification. Skull: Normal. Negative for fracture or focal lesion. Sinuses/Orbits: No acute finding. Other: None. CT CERVICAL SPINE FINDINGS Alignment: Normal. Skull base and vertebrae: No acute fracture. No primary bone lesion or focal pathologic process. Soft tissues and spinal canal: No prevertebral fluid or swelling. No visible canal  hematoma. Disc levels:  Intact. Upper chest: Negative. Other: None. IMPRESSION: 1.  No acute intracranial pathology. 2.  No fracture or static subluxation of the cervical spine. Electronically Signed   By: Lauralyn Primes M.D.   On: 08/06/2018 08:54   Ct Cervical Spine Wo Contrast  Result Date: 08/06/2018 CLINICAL DATA:  Head trauma EXAM: CT HEAD WITHOUT CONTRAST CT CERVICAL SPINE WITHOUT CONTRAST TECHNIQUE: Multidetector CT imaging of the head and cervical spine was performed following the standard protocol without intravenous contrast. Multiplanar CT image reconstructions of the cervical spine were also generated. COMPARISON:  08/03/2018 FINDINGS: CT HEAD  FINDINGS Brain: No evidence of acute infarction, hemorrhage, hydrocephalus, extra-axial collection or mass lesion/mass effect. Vascular: No hyperdense vessel or unexpected calcification. Skull: Normal. Negative for fracture or focal lesion. Sinuses/Orbits: No acute finding. Other: None. CT CERVICAL SPINE FINDINGS Alignment: Normal. Skull base and vertebrae: No acute fracture. No primary bone lesion or focal pathologic process. Soft tissues and spinal canal: No prevertebral fluid or swelling. No visible canal hematoma. Disc levels:  Intact. Upper chest: Negative. Other: None. IMPRESSION: 1.  No acute intracranial pathology. 2.  No fracture or static subluxation of the cervical spine. Electronically Signed   By: Lauralyn Primes M.D.   On: 08/06/2018 08:54    Pending Labs Unresulted Labs (From admission, onward)    Start     Ordered   08/13/18 0500  Creatinine, serum  (enoxaparin (LOVENOX)    CrCl >/= 30 ml/min)  Weekly,   R    Comments: while on enoxaparin therapy    08/06/18 1303   08/06/18 1253  CBC  (enoxaparin (LOVENOX)    CrCl >/= 30 ml/min)  Once,   STAT    Comments: Baseline for enoxaparin therapy IF NOT ALREADY DRAWN.  Notify MD if PLT < 100 K.    08/06/18 1303   08/06/18 1253  Creatinine, serum  (enoxaparin (LOVENOX)    CrCl >/= 30 ml/min)  Once,   STAT    Comments: Baseline for enoxaparin therapy IF NOT ALREADY DRAWN.    08/06/18 1303          Vitals/Pain Today's Vitals   08/06/18 1315 08/06/18 1345 08/06/18 1400 08/06/18 1415  BP: (!) 138/94 127/80 125/85 130/83  Pulse:  76    Resp: 19 14 20 19   Temp:      TempSrc:      SpO2:  100%    Weight:      Height:      PainSc:        Isolation Precautions No active isolations  Medications Medications  sodium chloride 0.9 % bolus 1,000 mL (0 mLs Intravenous Stopped 08/06/18 1142)    Followed by  0.9 %  sodium chloride infusion (1,000 mLs Intravenous New Bag/Given 08/06/18 1011)  0.9 %  sodium chloride infusion (75 mL/hr  Intravenous New Bag/Given 08/06/18 1330)  enoxaparin (LOVENOX) injection 40 mg (has no administration in time range)  LORazepam (ATIVAN) injection 4 mg (4 mg Intravenous Not Given 08/06/18 1339)  acetaminophen (TYLENOL) tablet 650 mg (has no administration in time range)    Or  acetaminophen (TYLENOL) suppository 650 mg (has no administration in time range)  polyethylene glycol (MIRALAX / GLYCOLAX) packet 17 g (has no administration in time range)  ondansetron (ZOFRAN) tablet 4 mg (has no administration in time range)    Or  ondansetron (ZOFRAN) injection 4 mg (has no administration in time range)  insulin aspart (novoLOG) injection 0-20 Units (0 Units Subcutaneous Not Given  08/06/18 1405)  ketorolac (TORADOL) 30 MG/ML injection 15 mg (15 mg Intravenous Given 08/06/18 1008)    Mobility walks Low fall risk   Focused Assessments Neuro Assessment Handoff:  Swallow screen pass? Yes  Cardiac Rhythm: Normal sinus rhythm       Neuro Assessment: Within Defined Limits Neuro Checks:      Last Documented NIHSS Modified Score:   Has TPA been given? No If patient is a Neuro Trauma and patient is going to OR before floor call report to 4N Charge nurse: [phone] or [phone]     R Recommendations: See Admitting Provider Note  Report given to:   Additional Notes:

## 2018-08-06 NOTE — ED Notes (Signed)
Pt is NSR on monitor 

## 2018-08-07 DIAGNOSIS — I10 Essential (primary) hypertension: Secondary | ICD-10-CM

## 2018-08-07 DIAGNOSIS — R569 Unspecified convulsions: Secondary | ICD-10-CM

## 2018-08-07 LAB — BASIC METABOLIC PANEL
Anion gap: 4 — ABNORMAL LOW (ref 5–15)
BUN: <5 mg/dL — ABNORMAL LOW (ref 6–20)
CO2: 27 mmol/L (ref 22–32)
Calcium: 8.1 mg/dL — ABNORMAL LOW (ref 8.9–10.3)
Chloride: 112 mmol/L — ABNORMAL HIGH (ref 98–111)
Creatinine, Ser: 1.23 mg/dL — ABNORMAL HIGH (ref 0.44–1.00)
GFR calc Af Amer: >60 mL/min (ref >60)
GFR calc non Af Amer: >60 mL/min (ref >60)
Glucose, Bld: 110 mg/dL — ABNORMAL HIGH (ref 70–99)
Potassium: 2.9 mmol/L — ABNORMAL LOW (ref 3.5–5.1)
Sodium: 143 mmol/L (ref 135–145)

## 2018-08-07 LAB — GLUCOSE, CAPILLARY
Glucose-Capillary: 145 mg/dL — ABNORMAL HIGH (ref 70–99)
Glucose-Capillary: 186 mg/dL — ABNORMAL HIGH (ref 70–99)
Glucose-Capillary: 188 mg/dL — ABNORMAL HIGH (ref 70–99)
Glucose-Capillary: 231 mg/dL — ABNORMAL HIGH (ref 70–99)
Glucose-Capillary: 86 mg/dL (ref 70–99)
Glucose-Capillary: 95 mg/dL (ref 70–99)
Glucose-Capillary: 99 mg/dL (ref 70–99)

## 2018-08-07 LAB — CBC
HCT: 35.3 % — ABNORMAL LOW (ref 36.0–46.0)
Hemoglobin: 11.4 g/dL — ABNORMAL LOW (ref 12.0–15.0)
MCH: 28.3 pg (ref 26.0–34.0)
MCHC: 32.3 g/dL (ref 30.0–36.0)
MCV: 87.6 fL (ref 80.0–100.0)
Platelets: 383 10*3/uL (ref 150–400)
RBC: 4.03 MIL/uL (ref 3.87–5.11)
RDW: 13.5 % (ref 11.5–15.5)
WBC: 6.1 10*3/uL (ref 4.0–10.5)
nRBC: 0 % (ref 0.0–0.2)

## 2018-08-07 MED ORDER — LAMOTRIGINE 25 MG PO TABS
25.0000 mg | ORAL_TABLET | Freq: Two times a day (BID) | ORAL | Status: DC
  Administered 2018-08-07 – 2018-08-08 (×3): 25 mg via ORAL
  Filled 2018-08-07 (×3): qty 1

## 2018-08-07 MED ORDER — POTASSIUM CHLORIDE CRYS ER 20 MEQ PO TBCR
40.0000 meq | EXTENDED_RELEASE_TABLET | Freq: Two times a day (BID) | ORAL | Status: AC
  Administered 2018-08-07 (×2): 40 meq via ORAL
  Filled 2018-08-07 (×2): qty 2

## 2018-08-07 MED ORDER — GABAPENTIN 300 MG PO CAPS
300.0000 mg | ORAL_CAPSULE | Freq: Every day | ORAL | Status: DC
  Administered 2018-08-07: 300 mg via ORAL
  Filled 2018-08-07: qty 1

## 2018-08-07 MED ORDER — TOPIRAMATE 25 MG PO TABS
25.0000 mg | ORAL_TABLET | Freq: Two times a day (BID) | ORAL | Status: DC
  Administered 2018-08-07 – 2018-08-08 (×2): 25 mg via ORAL
  Filled 2018-08-07 (×2): qty 1

## 2018-08-07 MED ORDER — MAGNESIUM SULFATE 2 GM/50ML IV SOLN
2.0000 g | Freq: Once | INTRAVENOUS | Status: AC
  Administered 2018-08-07: 2 g via INTRAVENOUS
  Filled 2018-08-07: qty 50

## 2018-08-07 MED ORDER — HYDROCODONE-ACETAMINOPHEN 5-325 MG PO TABS
1.0000 | ORAL_TABLET | Freq: Four times a day (QID) | ORAL | Status: AC | PRN
  Administered 2018-08-07 – 2018-08-08 (×2): 2 via ORAL
  Filled 2018-08-07 (×2): qty 2

## 2018-08-07 NOTE — Progress Notes (Signed)
Overnight on-call note  Received call from patient's RN regarding antiepileptic medications.  The order was not removed from the chart and patient received her p.o. antiepileptics-Keppra, Topamax, gabapentin. Based on recommendations from the day team, antiepileptics were to be held.  Actions: I have discontinued the antiepileptic medications (gabapentin, Keppra, Topamax) orders for now.  They can be resumed on the direction of the neurology team.  Further management per daytime neurology team We will continue to follow  -- Amie Portland, MD [facility] Pager: [phone] If 7pm to 7am, please call on call as listed on AMION.

## 2018-08-07 NOTE — Progress Notes (Signed)
EEG reviewed till 7am. Two events were captured on 08/06/2018 at 1822 and 2009. During both events, patient started having tremor like movements in her right leg which gradually progressed to whole body tremoring. Eyes were open during first event ( unable to clearly see during second) and she responded to RN shaking her head sideways when asked if she can stop the movement. It abruptly stopped after about 1 minute. Concomitant EEG did not show seizure before, during and after the event.   Please refer to final EEG report for details.

## 2018-08-07 NOTE — Progress Notes (Signed)
During initial assessment patient had weak bilateral hand grips and unable to dorsiflex/plantar flex feet but was able to turn self to left side in bed using feet to move. Now patient is able to hold drink and use cellphone without difficulty also able to slide self up in bed using legs. Patient requested to eat so neuro and attending give ok for diet orders. Patient has no issues swallowing meds whole with soda. Will continue to monitor.

## 2018-08-07 NOTE — Progress Notes (Signed)
LTM discontinued. No skin breakdown was seen. 

## 2018-08-07 NOTE — Progress Notes (Signed)
PROGRESS NOTE  Nancy ParadiseRashonda N Mcilrath ZOX:[id number] DOB: 2000-02-25 DOA: 08/06/2018 PCP: Claiborne RiggFleming, Zelda W, NP  HPI/Recap of past 24 hours: Nancy ParadiseRashonda N Nevel is a 18 y.o. female with medical history significant of diabetes type 2, morbid obesity, prematurity as an infant, precocious adrenarche, congestive heart failure with EF of 45 to 50% in January 2020 who was just discharged from our facility the day prior to presentation after having an evaluation for seizures.  The patient has had 3 admissions since June 8 and today will be her fourth one.  All of these admissions have been for uncontrolled seizures.  Another seizure occurred in the emergency department witnessed by nursing staff.  Post seizure she was postictal.  Records reveal that she has multiple seizures per day.  It is questionable as to whether these are true epileptic seizures versus nonepileptic seizure-like activity.  Neurology Dr. Otelia LimesLindzen following.  Ongoing EEG monitoring.  08/07/18: Patient was seen and examined at her bedside this morning.  He is alert and interactive.  Reports a headache and hurting everywhere at the time of this visit.  Also asking to eat, okay to resume diet by neurology.     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 No seizure event reported so far this morning Seizure precautions Resume diet if passes bedside swallow evaluation.  AKI Baseline creatinine appears to be 1.08 with GFR greater than 60 Presented with creatinine of 1.52 Creatinine improving 1.23 Avoid nephrotoxins Continue IV fluid hydration normal  saline at 75 cc/h Monitor urine output  Hypokalemia Potassium 2.9 Repleted with KCl p.o. 40 mEq x 2 doses Added IV magnesium 2 g once  Hypertension Acceptable Continue home antihypertensive: Propranolol  Chronic anxiety/depression Continue Seroquel and Prozac  Hyperlipidemia Continue Lipitor  Chronic systolic CHF Appears euvolemic Last 2D echo done on January 25, 2018 revealed LVEF 45 to 50% with diffuse hypokinesis Continue beta-blocker Continue strict I's and O's and daily weight  Risks: High risk for decompensation due to suspected seizure activity versus somatization disorder.  Patient will require at least 2 midnights for further evaluation and treatment of present condition.   DVT prophylaxis: Lovenox Code Status: Full code Family Communication: No family present at the time of admission. Disposition Plan: Likely home in 2 to 3 days Consults called: Dr. Otelia LimesLindzen from neurology    Objective: Vitals:   08/07/18 0549 08/07/18 0551 08/07/18 0700 08/07/18 0800  BP:  104/72 101/71 103/70  Pulse:  73 65 72  Resp:  16 17 17   Temp: 97.6 F (36.4 C)  98.3 F (36.8 C) 98.4 F (36.9 C)  TempSrc:   Oral Oral  SpO2:  100% 100% 100%  Weight:      Height:        Intake/Output Summary (Last 24 hours) at 08/07/2018 0910 Last data filed at 08/07/2018 0440 Gross per 24 hour  Intake 670 ml  Output -  Net 670 ml   Filed Weights   08/06/18 0607  Weight: 89.3 kg    Exam:  . General: 18 y.o. year-old female well developed well nourished in no acute distress.  Alert and oriented x3. . Cardiovascular: Regular rate and  rhythm with no rubs or gallops.  No thyromegaly or JVD noted.   Marland Kitchen Respiratory: Clear to auscultation with no wheezes or rales. Good inspiratory effort. . Abdomen: Soft nontender nondistended with normal bowel sounds x4 quadrants. . Musculoskeletal: Trace lower extremity edema. 2/4 pulses in all 4 extremities. Marland Kitchen Psychiatry: Mood is appropriate for condition  and setting   Data Reviewed: CBC: Recent Labs  Lab 08/03/18 0511 08/03/18 0608 08/04/18 0345 08/06/18 0604 08/06/18 1416 08/07/18 0639  WBC 9.9  --  10.1 8.7 7.3 6.1  NEUTROABS 5.4  --   --   --   --   --   HGB 13.8 16.0* 12.5 12.9 11.8* 11.4*  HCT 42.2 47.0* 38.1 39.9 36.2 35.3*  MCV 87.0  --  86.6 88.7 87.2 87.6  PLT 338  --  345 380 291 383   Basic Metabolic Panel: Recent Labs  Lab 08/03/18 1235 08/03/18 1720 08/04/18 0345 08/05/18 0334 08/06/18 0604 08/06/18 1416 08/07/18 0639  NA 141  --  141 141 139  --  143  K 3.4*  --  3.3* 3.5 3.7  --  2.9*  CL 108  --  113* 113* 108  --  112*  CO2 21*  --  21* 21* 19*  --  27  GLUCOSE 345*  --  173* 238* 291*  --  110*  BUN 6  --  5* 9 7  --  <5*  CREATININE 1.82*  --  1.33* 1.08* 1.52* 1.37* 1.23*  CALCIUM 8.5*  --  8.2* 8.6* 9.1  --  8.1*  MG  --  1.5*  --   --   --   --   --    GFR: Estimated Creatinine Clearance: 77.4 mL/min (A) (by C-G formula based on SCr of 1.23 mg/dL (H)). Liver Function Tests: Recent Labs  Lab 08/03/18 0511 08/06/18 1204  AST 14* 15  ALT 12 13  ALKPHOS 63 57  BILITOT 0.4 0.5  PROT 6.7 6.7  ALBUMIN 3.9 3.8   Recent Labs  Lab 08/06/18 0604  LIPASE 25   No results for input(s): AMMONIA in the last 168 hours. Coagulation Profile: No results for input(s): INR, PROTIME in the last 168 hours. Cardiac Enzymes: Recent Labs  Lab 08/03/18 1720  CKTOTAL 41   BNP (last 3 results) No results for input(s): PROBNP in the last 8760 hours. HbA1C: No results for input(s): HGBA1C in the last 72 hours. CBG: Recent Labs  Lab 08/06/18 2325 08/07/18 0014 08/07/18 0424 08/07/18 0556 08/07/18 0805  GLUCAP 112* 145* 86 99 95   Lipid Profile: No results for input(s): CHOL, HDL, LDLCALC, TRIG, CHOLHDL, LDLDIRECT in the last 72 hours. Thyroid Function Tests: No results for input(s): TSH, T4TOTAL, FREET4, T3FREE, THYROIDAB in the last 72 hours. Anemia Panel: No results for input(s):  VITAMINB12, FOLATE, FERRITIN, TIBC, IRON, RETICCTPCT in the last 72 hours. Urine analysis:    Component Value Date/Time   COLORURINE STRAW (A) 08/06/2018 0901   APPEARANCEUR CLEAR 08/06/2018 0901   LABSPEC 1.008 08/06/2018 0901   PHURINE 6.0 08/06/2018 0901   GLUCOSEU >=500 (A) 08/06/2018 0901   HGBUR MODERATE (A) 08/06/2018 0901   BILIRUBINUR NEGATIVE 08/06/2018 0901   BILIRUBINUR negative 07/13/2018 1651   KETONESUR 20 (A) 08/06/2018 0901   PROTEINUR NEGATIVE 08/06/2018 0901   UROBILINOGEN 0.2 07/13/2018 1651   UROBILINOGEN 1.0 07/09/2017 1324   NITRITE NEGATIVE 08/06/2018 0901   LEUKOCYTESUR NEGATIVE 08/06/2018 0901   Sepsis Labs: @LABRCNTIP (procalcitonin:4,lacticidven:4)  ) Recent Results (  from the past 240 hour(s))  SARS Coronavirus 2 (CEPHEID - Performed in [facility] hospital lab), Hosp Order     Status: None   Collection Time: 08/03/18  4:59 AM   Specimen: Nasopharyngeal Swab  Result Value Ref Range Status   SARS Coronavirus 2 NEGATIVE NEGATIVE Final    Comment: (NOTE) If result is NEGATIVE SARS-CoV-2 target nucleic acids are NOT DETECTED. The SARS-CoV-2 RNA is generally detectable in upper and lower  respiratory specimens during the acute phase of infection. The lowest  concentration of SARS-CoV-2 viral copies this assay can detect is 250  copies / mL. A negative result does not preclude SARS-CoV-2 infection  and should not be used as the sole basis for treatment or other  patient management decisions.  A negative result may occur with  improper specimen collection / handling, submission of specimen other  than nasopharyngeal swab, presence of viral mutation(s) within the  areas targeted by this assay, and inadequate number of viral copies  (<250 copies / mL). A negative result must be combined with clinical  observations, patient history, and epidemiological information. If result is POSITIVE SARS-CoV-2 target nucleic acids are DETECTED. The SARS-CoV-2 RNA is  generally detectable in upper and lower  respiratory specimens dur ing the acute phase of infection.  Positive  results are indicative of active infection with SARS-CoV-2.  Clinical  correlation with patient history and other diagnostic information is  necessary to determine patient infection status.  Positive results do  not rule out bacterial infection or co-infection with other viruses. If result is PRESUMPTIVE POSTIVE SARS-CoV-2 nucleic acids MAY BE PRESENT.   A presumptive positive result was obtained on the submitted specimen  and confirmed on repeat testing.  While 2019 novel coronavirus  (SARS-CoV-2) nucleic acids may be present in the submitted sample  additional confirmatory testing may be necessary for epidemiological  and / or clinical management purposes  to differentiate between  SARS-CoV-2 and other Sarbecovirus currently known to infect humans.  If clinically indicated additional testing with an alternate test  methodology [phone]) is advised. The SARS-CoV-2 RNA is generally  detectable in upper and lower respiratory sp ecimens during the acute  phase of infection. The expected result is Negative. Fact Sheet for Patients:  StrictlyIdeas.no Fact Sheet for Healthcare Providers: BankingDealers.co.za This test is not yet approved or cleared by the [place] FDA and has been authorized for detection and/or diagnosis of SARS-CoV-2 by FDA under an Emergency Use Authorization (EUA).  This EUA will remain in effect (meaning this test can be used) for the duration of the COVID-19 declaration under Section 564(b)(1) of the Act, 21 U.S.C. section 360bbb-3(b)(1), unless the authorization is terminated or revoked sooner. Performed at [facility], [place] [address], [place], [place] [address]   MRSA PCR Screening     Status: None   Collection Time: 08/06/18  4:19 PM   Specimen: Nasopharyngeal  Result Value Ref Range Status    MRSA by PCR NEGATIVE NEGATIVE Final    Comment:        The GeneXpert MRSA Assay (FDA approved for NASAL specimens only), is one component of a comprehensive MRSA colonization surveillance program. It is not intended to diagnose MRSA infection nor to guide or monitor treatment for MRSA infections. Performed at [facility], [place] [address], [place], [place] [address]       Studies: No results found.  Scheduled Meds: . atorvastatin  20 mg Oral Daily  . enoxaparin (LOVENOX) injection  40 mg Subcutaneous Q24H  . FLUoxetine  20 mg Oral QHS  . HYDROcodone-acetaminophen  2 tablet Oral Once  . insulin aspart  0-20 Units Subcutaneous Q4H  . insulin glargine  20 Units Subcutaneous BID  . meloxicam  7.5 mg Oral Daily  . potassium chloride  40 mEq Oral BID  . propranolol  20 mg Oral BID  . QUEtiapine  400 mg Oral QHS    Continuous Infusions: . sodium chloride 75 mL/hr (08/06/18 1330)  . magnesium sulfate bolus IVPB       LOS: 1 day     Darlin Droparole N Oviya Ammar, MD [facility] Pager [phone]  If 7PM-7AM, please contact night-coverage [email or web address] Password [email or web address] 08/07/2018, 9:10 AM

## 2018-08-07 NOTE — Progress Notes (Addendum)
Subjective: States that she wants to eat. Had 2 episodes overnight without electrographic correlate on LTM EEG.   Objective: Current vital signs: BP 103/70   Pulse 72   Temp 98.4 F (36.9 C) (Oral)   Resp 17   Ht  (1.6 m)   Wt 89.3 kg   LMP 08/05/2018   SpO2 100%   BMI 34.87 kg/m  Vital signs in last 24 hours: Temp:  [97.5 F (36.4 C)-98.8 F (37.1 C)] 98.4 F (36.9 C) (08/01 0800) Pulse Rate:  [60-99] 72 (08/01 0800) Resp:  [14-23] 17 (08/01 0800) BP: (101-130)/(67-87) 103/70 (08/01 0800) SpO2:  [98 %-100 %] 100 % (08/01 0800)  Intake/Output from previous day: 07/31 0701 - 08/01 0700 In: 670 [P.O.:280; I.V.:390] Out: -  Intake/Output this shift: No intake/output data recorded. Nutritional status:  Diet Order            Diet heart healthy/carb modified Room service appropriate? Yes; Fluid consistency: Thin  Diet effective now             HEENT: Abrasions to chin noted Lungs: Respirations unlabored Ext: No edema  Neurologic Exam: Ment: Awake and alert. Conversing with examiner. Dysthymic affect.  CN: Eyes conjugate. No nystagmus. Face symmetric Motor: 5/5 x 4 Cerebellar: No ataxia noted.   Lab Results: Results for orders placed or performed during the hospital encounter of 08/06/18 (from the past 48 hour(s))  Basic metabolic panel     Status: Abnormal   Collection Time: 08/06/18  6:04 AM  Result Value Ref Range   Sodium 139 135 - 145 mmol/L   Potassium 3.7 3.5 - 5.1 mmol/L   Chloride 108 98 - 111 mmol/L   CO2 19 (L) 22 - 32 mmol/L   Glucose, Bld 291 (H) 70 - 99 mg/dL   BUN 7 6 - 20 mg/dL   Creatinine, Ser 9.60 (H) 0.44 - 1.00 mg/dL   Calcium 9.1 8.9 - 45.4 mg/dL   GFR calc non Af Amer 49 (L) >60 mL/min   GFR calc Af Amer 57 (L) >60 mL/min   Anion gap 12 5 - 15    Comment: Performed at [facility], [address], [place], [place] [address]  CBC     Status: None   Collection Time: 08/06/18  6:04 AM  Result Value Ref Range   WBC 8.7 4.0  - 10.5 K/uL   RBC 4.50 3.87 - 5.11 MIL/uL   Hemoglobin 12.9 12.0 - 15.0 g/dL   HCT 91.4 78.2 - 95.6 %   MCV 88.7 80.0 - 100.0 fL   MCH 28.7 26.0 - 34.0 pg   MCHC 32.3 30.0 - 36.0 g/dL   RDW 21.3 08.6 - 57.8 %   Platelets 380 150 - 400 K/uL   nRBC 0.0 0.0 - 0.2 %    Comment: Performed at [facility], [address] [place], [place] [address]  Troponin I (High Sensitivity)     Status: None   Collection Time: 08/06/18  6:04 AM  Result Value Ref Range   Troponin I (High Sensitivity) 2 <18 ng/L    Comment: (NOTE) Elevated high sensitivity troponin I (hsTnI) values and significant  changes across serial measurements may suggest ACS but many other  chronic and acute conditions are known to elevate hsTnI results.  Refer to the "Links" section for chest pain algorithms and additional  guidance. Performed at [facility], [address] [place], [place] [address]   Lipase, blood  Status: None   Collection Time: 08/06/18  6:04 AM  Result Value Ref Range   Lipase 25 11 - 51 U/L    Comment: Performed at [facility], [address] [place], [place] [address]  CBG monitoring, ED     Status: Abnormal   Collection Time: 08/06/18  6:22 AM  Result Value Ref Range   Glucose-Capillary 236 (H) 70 - 99 mg/dL  I-Stat beta hCG blood, ED     Status: None   Collection Time: 08/06/18  7:40 AM  Result Value Ref Range   I-stat hCG, quantitative <5.0 <5 mIU/mL   Comment 3            Comment:   GEST. AGE      CONC.  (mIU/mL)   <=1 WEEK        5 - 50     2 WEEKS       50 - 500     3 WEEKS       100 - 10,000     4 WEEKS     1,000 - 30,000        FEMALE AND NON-PREGNANT FEMALE:     LESS THAN 5 mIU/mL   Troponin I (High Sensitivity)     Status: None   Collection Time: 08/06/18  8:04 AM  Result Value Ref Range   Troponin I (High Sensitivity) 3 <18 ng/L    Comment: (NOTE) Elevated high sensitivity troponin I (hsTnI) values and significant  changes across serial measurements may  suggest ACS but many other  chronic and acute conditions are known to elevate hsTnI results.  Refer to the "Links" section for chest pain algorithms and additional  guidance. Performed at [facility], [address], [place], [place] [address]   Urinalysis, Routine w reflex microscopic     Status: Abnormal   Collection Time: 08/06/18  9:01 AM  Result Value Ref Range   Color, Urine STRAW (A) YELLOW   APPearance CLEAR CLEAR   Specific Gravity, Urine 1.008 1.005 - 1.030   pH 6.0 5.0 - 8.0   Glucose, UA >=500 (A) NEGATIVE mg/dL   Hgb urine dipstick MODERATE (A) NEGATIVE   Bilirubin Urine NEGATIVE NEGATIVE   Ketones, ur 20 (A) NEGATIVE mg/dL   Protein, ur NEGATIVE NEGATIVE mg/dL   Nitrite NEGATIVE NEGATIVE   Leukocytes,Ua NEGATIVE NEGATIVE   RBC / HPF 0-5 0 - 5 RBC/hpf   WBC, UA 0-5 0 - 5 WBC/hpf   Bacteria, UA RARE (A) NONE SEEN   Squamous Epithelial / LPF 0-5 0 - 5   Mucus PRESENT     Comment: Performed at [facility], [address] [place], [place] [address]  Hepatic function panel     Status: None   Collection Time: 08/06/18 12:04 PM  Result Value Ref Range   Total Protein 6.7 6.5 - 8.1 g/dL   Albumin 3.8 3.5 - 5.0 g/dL   AST 15 15 - 41 U/L   ALT 13 0 - 44 U/L   Alkaline Phosphatase 57 38 - 126 U/L   Total Bilirubin 0.5 0.3 - 1.2 mg/dL   Bilirubin, Direct 0.1 0.0 - 0.2 mg/dL   Indirect Bilirubin 0.4 0.3 - 0.9 mg/dL    Comment: Performed at [facility], [address], [place], [place] [address]  CBG monitoring, ED     Status: Abnormal   Collection Time: 08/06/18  2:03 PM  Result Value Ref Range  Glucose-Capillary 175 (H) 70 - 99 mg/dL   Comment 1 Notify RN    Comment 2 Document in Chart   CBC     Status: Abnormal   Collection Time: 08/06/18  2:16 PM  Result Value Ref Range   WBC 7.3 4.0 - 10.5 K/uL   RBC 4.15 3.87 - 5.11 MIL/uL   Hemoglobin 11.8 (L) 12.0 - 15.0 g/dL   HCT 26.4 15.8 - 30.9 %   MCV 87.2 80.0 - 100.0 fL   MCH 28.4 26.0 - 34.0  pg   MCHC 32.6 30.0 - 36.0 g/dL   RDW 40.7 68.0 - 88.1 %   Platelets 291 150 - 400 K/uL   nRBC 0.0 0.0 - 0.2 %    Comment: Performed at [facility], [address], [place], [place] [address]  Creatinine, serum     Status: Abnormal   Collection Time: 08/06/18  2:16 PM  Result Value Ref Range   Creatinine, Ser 1.37 (H) 0.44 - 1.00 mg/dL   GFR calc non Af Amer 55 (L) >60 mL/min   GFR calc Af Amer >60 >60 mL/min    Comment: Performed at [facility], [address], [place], [place] [address]  MRSA PCR Screening     Status: None   Collection Time: 08/06/18  4:19 PM   Specimen: Nasopharyngeal  Result Value Ref Range   MRSA by PCR NEGATIVE NEGATIVE    Comment:        The GeneXpert MRSA Assay (FDA approved for NASAL specimens only), is one component of a comprehensive MRSA colonization surveillance program. It is not intended to diagnose MRSA infection nor to guide or monitor treatment for MRSA infections. Performed at [facility], [address] [place], [place] [address]   Glucose, capillary     Status: Abnormal   Collection Time: 08/06/18  4:32 PM  Result Value Ref Range   Glucose-Capillary 185 (H) 70 - 99 mg/dL  Glucose, capillary     Status: Abnormal   Collection Time: 08/06/18  8:16 PM  Result Value Ref Range   Glucose-Capillary 119 (H) 70 - 99 mg/dL  Glucose, capillary     Status: Abnormal   Collection Time: 08/06/18 11:25 PM  Result Value Ref Range   Glucose-Capillary 112 (H) 70 - 99 mg/dL  Glucose, capillary     Status: Abnormal   Collection Time: 08/07/18 12:14 AM  Result Value Ref Range   Glucose-Capillary 145 (H) 70 - 99 mg/dL  Glucose, capillary     Status: None   Collection Time: 08/07/18  4:24 AM  Result Value Ref Range   Glucose-Capillary 86 70 - 99 mg/dL  Glucose, capillary     Status: None   Collection Time: 08/07/18  5:56 AM  Result Value Ref Range   Glucose-Capillary 99 70 - 99 mg/dL  Basic metabolic panel     Status: Abnormal    Collection Time: 08/07/18  6:39 AM  Result Value Ref Range   Sodium 143 135 - 145 mmol/L   Potassium 2.9 (L) 3.5 - 5.1 mmol/L   Chloride 112 (H) 98 - 111 mmol/L   CO2 27 22 - 32 mmol/L   Glucose, Bld 110 (H) 70 - 99 mg/dL   BUN <5 (L) 6 - 20 mg/dL   Creatinine, Ser 4.62 (H) 0.44 - 1.00 mg/dL   Calcium 8.1 (L) 8.9 - 10.3 mg/dL   GFR calc non Af Amer >60 >60 mL/min   GFR calc Af  Amer >60 >60 mL/min   Anion gap 4 (L) 5 - 15    Comment: Performed at [facility], [address] [place], [place] [address]  CBC     Status: Abnormal   Collection Time: 08/07/18  6:39 AM  Result Value Ref Range   WBC 6.1 4.0 - 10.5 K/uL   RBC 4.03 3.87 - 5.11 MIL/uL   Hemoglobin 11.4 (L) 12.0 - 15.0 g/dL   HCT 60.435.3 (L) 54.036.0 - 98.146.0 %   MCV 87.6 80.0 - 100.0 fL   MCH 28.3 26.0 - 34.0 pg   MCHC 32.3 30.0 - 36.0 g/dL   RDW 19.113.5 47.811.5 - 29.515.5 %   Platelets 383 150 - 400 K/uL   nRBC 0.0 0.0 - 0.2 %    Comment: Performed at [facility], [address] [place], [place] [address]  Glucose, capillary     Status: None   Collection Time: 08/07/18  8:05 AM  Result Value Ref Range   Glucose-Capillary 95 70 - 99 mg/dL  Glucose, capillary     Status: Abnormal   Collection Time: 08/07/18 11:51 AM  Result Value Ref Range   Glucose-Capillary 188 (H) 70 - 99 mg/dL    Recent Results (from the past 240 hour(s))  SARS Coronavirus 2 (CEPHEID - Performed in [facility] hospital lab), Hosp Order     Status: None   Collection Time: 08/03/18  4:59 AM   Specimen: Nasopharyngeal Swab  Result Value Ref Range Status   SARS Coronavirus 2 NEGATIVE NEGATIVE Final    Comment: (NOTE) If result is NEGATIVE SARS-CoV-2 target nucleic acids are NOT DETECTED. The SARS-CoV-2 RNA is generally detectable in upper and lower  respiratory specimens during the acute phase of infection. The lowest  concentration of SARS-CoV-2 viral copies this assay can detect is 250  copies / mL. A negative result does not preclude  SARS-CoV-2 infection  and should not be used as the sole basis for treatment or other  patient management decisions.  A negative result may occur with  improper specimen collection / handling, submission of specimen other  than nasopharyngeal swab, presence of viral mutation(s) within the  areas targeted by this assay, and inadequate number of viral copies  (<250 copies / mL). A negative result must be combined with clinical  observations, patient history, and epidemiological information. If result is POSITIVE SARS-CoV-2 target nucleic acids are DETECTED. The SARS-CoV-2 RNA is generally detectable in upper and lower  respiratory specimens dur ing the acute phase of infection.  Positive  results are indicative of active infection with SARS-CoV-2.  Clinical  correlation with patient history and other diagnostic information is  necessary to determine patient infection status.  Positive results do  not rule out bacterial infection or co-infection with other viruses. If result is PRESUMPTIVE POSTIVE SARS-CoV-2 nucleic acids MAY BE PRESENT.   A presumptive positive result was obtained on the submitted specimen  and confirmed on repeat testing.  While 2019 novel coronavirus  (SARS-CoV-2) nucleic acids may be present in the submitted sample  additional confirmatory testing may be necessary for epidemiological  and / or clinical management purposes  to differentiate between  SARS-CoV-2 and other Sarbecovirus currently known to infect humans.  If clinically indicated additional testing with an alternate test  methodology [phone]([id number]) is advised. The SARS-CoV-2 RNA is generally  detectable in upper and lower respiratory sp ecimens during the acute  phase of infection. The expected result is Negative. Fact Sheet for  Patients:  BoilerBrush.com.cy Fact Sheet for Healthcare Providers: https://pope.com/ This test is not yet approved or cleared by the  [place] FDA and has been authorized for detection and/or diagnosis of SARS-CoV-2 by FDA under an Emergency Use Authorization (EUA).  This EUA will remain in effect (meaning this test can be used) for the duration of the COVID-19 declaration under Section 564(b)(1) of the Act, 21 U.S.C. section 360bbb-3(b)(1), unless the authorization is terminated or revoked sooner. Performed at [facility], [address], [place], [place] [address]   MRSA PCR Screening     Status: None   Collection Time: 08/06/18  4:19 PM   Specimen: Nasopharyngeal  Result Value Ref Range Status   MRSA by PCR NEGATIVE NEGATIVE Final    Comment:        The GeneXpert MRSA Assay (FDA approved for NASAL specimens only), is one component of a comprehensive MRSA colonization surveillance program. It is not intended to diagnose MRSA infection nor to guide or monitor treatment for MRSA infections. Performed at [facility], [address] [place], [place] [address]     Lipid Panel No results for input(s): CHOL, TRIG, HDL, CHOLHDL, VLDL, LDLCALC in the last 72 hours.  Studies/Results: Dg Chest 2 View  Result Date: 08/06/2018 CLINICAL DATA:  Seizures. EXAM: CHEST - 2 VIEW COMPARISON:  08/03/2018. FINDINGS: Mediastinum hilar structures normal. Heart size normal. Low lung volumes. No focal alveolar infiltrate. No pleural effusion or pneumothorax. No acute bony abnormality. IMPRESSION: Low lung volumes.  No acute cardiopulmonary disease identified. Electronically Signed   By: Maisie Fus  Register   On: 08/06/2018 06:24   Dg Thoracic Spine 2 View  Result Date: 08/06/2018 CLINICAL DATA:  Pt to ED for seizures. Per EMS, pt is having chest and abdominal pain. Pt completley non-responsive. Best obtainable imaging due to pt's condition EXAM: THORACIC SPINE 2 VIEWS COMPARISON:  None. FINDINGS: There is no evidence of thoracic spine fracture. Alignment is normal. No other significant bone abnormalities are  identified. IMPRESSION: Negative. Electronically Signed   By: Corlis Leak M.D.   On: 08/06/2018 08:53   Dg Lumbar Spine Complete  Result Date: 08/06/2018 CLINICAL DATA:  Pt to ED for seizures. Per EMS, pt is having chest and abdominal pain. Pt completley non-responsive. Best obtainable imaging due to pt's condition EXAM: LUMBAR SPINE - COMPLETE 4+ VIEW COMPARISON:  None. FINDINGS: There is no evidence of lumbar spine fracture. Alignment is normal. Intervertebral disc spaces are maintained. IMPRESSION: Negative. Electronically Signed   By: Corlis Leak M.D.   On: 08/06/2018 08:54   Ct Head Wo Contrast  Result Date: 08/06/2018 CLINICAL DATA:  Head trauma EXAM: CT HEAD WITHOUT CONTRAST CT CERVICAL SPINE WITHOUT CONTRAST TECHNIQUE: Multidetector CT imaging of the head and cervical spine was performed following the standard protocol without intravenous contrast. Multiplanar CT image reconstructions of the cervical spine were also generated. COMPARISON:  08/03/2018 FINDINGS: CT HEAD FINDINGS Brain: No evidence of acute infarction, hemorrhage, hydrocephalus, extra-axial collection or mass lesion/mass effect. Vascular: No hyperdense vessel or unexpected calcification. Skull: Normal. Negative for fracture or focal lesion. Sinuses/Orbits: No acute finding. Other: None. CT CERVICAL SPINE FINDINGS Alignment: Normal. Skull base and vertebrae: No acute fracture. No primary bone lesion or focal pathologic process. Soft tissues and spinal canal: No prevertebral fluid or swelling. No visible canal hematoma. Disc levels:  Intact. Upper chest: Negative. Other: None. IMPRESSION: 1.  No acute intracranial pathology. 2.  No fracture or static subluxation of the cervical  spine. Electronically Signed   By: Lauralyn PrimesAlex  Bibbey M.D.   On: 08/06/2018 08:54   Ct Cervical Spine Wo Contrast  Result Date: 08/06/2018 CLINICAL DATA:  Head trauma EXAM: CT HEAD WITHOUT CONTRAST CT CERVICAL SPINE WITHOUT CONTRAST TECHNIQUE: Multidetector CT imaging  of the head and cervical spine was performed following the standard protocol without intravenous contrast. Multiplanar CT image reconstructions of the cervical spine were also generated. COMPARISON:  08/03/2018 FINDINGS: CT HEAD FINDINGS Brain: No evidence of acute infarction, hemorrhage, hydrocephalus, extra-axial collection or mass lesion/mass effect. Vascular: No hyperdense vessel or unexpected calcification. Skull: Normal. Negative for fracture or focal lesion. Sinuses/Orbits: No acute finding. Other: None. CT CERVICAL SPINE FINDINGS Alignment: Normal. Skull base and vertebrae: No acute fracture. No primary bone lesion or focal pathologic process. Soft tissues and spinal canal: No prevertebral fluid or swelling. No visible canal hematoma. Disc levels:  Intact. Upper chest: Negative. Other: None. IMPRESSION: 1.  No acute intracranial pathology. 2.  No fracture or static subluxation of the cervical spine. Electronically Signed   By: Lauralyn PrimesAlex  Bibbey M.D.   On: 08/06/2018 08:54    Medications:  Scheduled: . atorvastatin  20 mg Oral Daily  . enoxaparin (LOVENOX) injection  40 mg Subcutaneous Q24H  . FLUoxetine  20 mg Oral QHS  . HYDROcodone-acetaminophen  2 tablet Oral Once  . insulin aspart  0-20 Units Subcutaneous Q4H  . insulin glargine  20 Units Subcutaneous BID  . meloxicam  7.5 mg Oral Daily  . potassium chloride  40 mEq Oral BID  . propranolol  20 mg Oral BID  . QUEtiapine  400 mg Oral QHS   Continuous: . sodium chloride 75 mL/hr (08/06/18 1330)    LTM EEG preliminary report, 8/1:  EEG reviewed till 7am. Two events were captured on 08/06/2018 at 1822 and 2009. During both events, patient started having tremor like movements in her right leg which gradually progressed to whole body tremoring. Eyes were open during first event ( unable to clearly see during second) and she responded to RN shaking her head sideways when asked if she can stop the movement. It abruptly stopped after about 1 minute.  Concomitant EEG did not show seizure before, during and after the event.    Assessment;  18 year old female re-presenting with seizure versus pseudoseizure.  1. Nursing staff witnessed the patient with seizure-like activity in the ED while waiting for placement. Per EDP, the activity appeared suggestive of possible pseudoseizure and she was not postictal afterwards. Patient has several recent admissions for seizures with this and the prior admission resulting in a seizure while in the waiting room of the ED. 2.  The patient's physical exam in the ED was notable for what appeared hightly consistent with intentional non-cooperation. On exam, the patient demonstrated inconsistent deficits which resolved at times and reoccurred prior to again resolving. Also was distractable. She was not objectively post-ictal.   3. She endorsed compliance with her medication stating that she has been taking 1000 mg BID of Keppra since last discharge.  4.  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 Less likely epilepsy given her prior unremarkable EEG's and exam findings today without postictal state following spells.   5. LTM EEG overnight captured 2 spells without electrographic correlate.    6. Per mother, the patient has demonstrated notable cognitive slowing since starting Keppra. Restarting her on this medication is not currently indicated given the pseudoseizures seen on overnight EEG. 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    Recommendations: - Restart her home dosage regimens of Neurontin and Topamax -- Start low-dose Lamictal -- Do not restart Keppra due to sedative effect on this patient as well as pseudoseizures on EEG.  -- Discontinue LTM EEG  - Per [facility] statutes, patients with seizures are not allowed to drive  until  they have been seizure-free for six months. Use caution when using heavy equipment or power tools. Avoid working on ladders or at heights. Take showers instead of baths. Ensure the water temperature is not too high on the home water heater. Do not go swimming alone. When caring for infants or small children, sit down when holding, feeding, or changing them to minimize risk of injury to the child in the event you have a seizure. Also, maintain good sleep hygiene. Avoid alcohol. -- Outpatient Neurology follow up -- Also will need Psychology referral for Cognitive Behavioral Therapy -- Neurohospitalist team will sign off. Please call if there are additional questions.     LOS: 1 day   @Electronically  signed: Dr. Kerney Elbe 08/07/2018  1:40 PM

## 2018-08-07 NOTE — Procedures (Signed)
NOTE INCOMPLETE   Patient Name: Nancy [name]  MRN: [id number]  Epilepsy Attending: Lora Havens  Referring Physician/Provider: Dr Vassie Moment Duration: 08/06/2018 1344 to 8/1/20201127 am  Patient history: 18yo F with seizure like episodes. EEG to evaluate  Level of alertness: awake, alseep  AEDs during EEG study: Gabapentin, Keppra, Topamax  Technical aspects: This EEG study was done with scalp electrodes positioned according to the 10-20 International system of electrode placement. Electrical activity was acquired at a sampling rate of 500Hz  and reviewed with a high frequency filter of 70Hz  and a low frequency filter of 1Hz . EEG data were recorded continuously and digitally stored.   DESCRIPTION:  The posterior dominant rhythm consists of 8-9 Hz activity of moderate voltage (25-35 uV) seen predominantly in posterior head regions, symmetric and reactive to eye opening and eye closing.Vertex waves, sleep spindles (-12-14hz ), K complexes and slow wave sleep was noted.  Rare sharp transients were noted in bifrontal region. Hyperventilation and photic stimulation were not performed.  Two events were captured on 08/06/2018 at 1822 and 2009. During both events, patient started having tremor like movements in her right leg which gradually progressed to whole body tremoring. Eyes were open during first event ( unable to clearly see during second) and she responded to RN shaking her head sideways when asked if she can stop the movement. It abruptly stopped after about 1 minute. Concomitant EEG did not show seizure before, during and after the event.  IMPRESSION: This study is within normal limits. No seizures or epileptiform discharges were seen throughout the recording. Two event of whole body tremoring did not show any epileptiform discharges and were most likely non epileptic in nature.

## 2018-08-08 DIAGNOSIS — E1165 Type 2 diabetes mellitus with hyperglycemia: Secondary | ICD-10-CM

## 2018-08-08 DIAGNOSIS — F418 Other specified anxiety disorders: Secondary | ICD-10-CM

## 2018-08-08 DIAGNOSIS — Z6834 Body mass index (BMI) 34.0-34.9, adult: Secondary | ICD-10-CM

## 2018-08-08 DIAGNOSIS — E6609 Other obesity due to excess calories: Secondary | ICD-10-CM

## 2018-08-08 LAB — BASIC METABOLIC PANEL
Anion gap: 7 (ref 5–15)
BUN: 10 mg/dL (ref 6–20)
CO2: 25 mmol/L (ref 22–32)
Calcium: 8.8 mg/dL — ABNORMAL LOW (ref 8.9–10.3)
Chloride: 107 mmol/L (ref 98–111)
Creatinine, Ser: 0.96 mg/dL (ref 0.44–1.00)
GFR calc Af Amer: >60 mL/min (ref >60)
GFR calc non Af Amer: >60 mL/min (ref >60)
Glucose, Bld: 157 mg/dL — ABNORMAL HIGH (ref 70–99)
Potassium: 4.1 mmol/L (ref 3.5–5.1)
Sodium: 139 mmol/L (ref 135–145)

## 2018-08-08 LAB — GLUCOSE, CAPILLARY
Glucose-Capillary: 113 mg/dL — ABNORMAL HIGH (ref 70–99)
Glucose-Capillary: 136 mg/dL — ABNORMAL HIGH (ref 70–99)
Glucose-Capillary: 210 mg/dL — ABNORMAL HIGH (ref 70–99)
Glucose-Capillary: 238 mg/dL — ABNORMAL HIGH (ref 70–99)

## 2018-08-08 LAB — MAGNESIUM: Magnesium: 2 mg/dL (ref 1.7–2.4)

## 2018-08-08 MED ORDER — LAMOTRIGINE 25 MG PO TABS: 25.0000 mg | ORAL_TABLET | Freq: Two times a day (BID) | ORAL | 2 refills | Status: DC

## 2018-08-08 NOTE — Progress Notes (Signed)
PIV removed. AVS reviewed with patient. Patient discharged via wheelchair by staff to go home with mother.

## 2018-08-08 NOTE — Discharge Summary (Signed)
Physician Discharge Summary  Nancy [name] DDU:[id number] DOB: 2000-02-03 DOA: 08/06/2018  PCP: [name], NP  Admit date: 08/06/2018 Discharge date: 08/08/2018  Admitted From: Home Disposition: Home  Recommendations for Outpatient Follow-up:  1. Follow up with PCP in 1 week 2. Discontinued Keppra by neurology 3. Started on Lamictal by neurology 4. Recommend outpatient referral to psychology for cognitive behavioral therapy  Home Health: No Equipment/Devices: None  Discharge Condition: Stable CODE STATUS: Full code Diet recommendation: Carbohydrate modified, heart healthy diet  History of present illness:  Nancy [name] is a 18 y.o. female with medical history significant of diabetes type 2, morbid obesity, prematurity as an infant, precocious adrenarche, congestive heart failure with EF of 45 to 50% in January 2020 who was just discharged yesterday from our facility after having an evaluation for seizures.  The patient has had 3 admissions since June 8 and today will be her fourth one.  All of these admissions have been for uncontrolled seizures.  She was seen in consultation on June 8 by Dr. Cheral Marker from neurology who felt that her seizures were perhaps precipitated by a focus that was noted on an MRI in 2019 and associated with hyperglycemia.  The entirety of the history is obtained from the ER physician the hospital records as patient is completely obtunded and not able to speak to me.  Patient had called EMS and told EMS that she fell and hit her head.  She also complained of having abdominal pain and chest pain to the nurse.  On evaluation by the ER doctor she complained of pain in her chest but not when I evaluated her. Cannot tell me nothing about her seizures.  Planes of difficulty moving her arms and legs to the ER physician but not to me.  ED Course: Patient with a waxing and waning course in the emergency department after a brief period of inability to move her legs she  returned back to normal and began complaining of more chest and abdominal pain.  Laboratory tests were unremarkable with the exception of mild hyperglycemia and a slight decrease in bicarbonate.  No evidence of DKA with a normal anion gap.  Another seizure occurred in the emergency department witnessed by nursing staff.  Post seizure she was postictal.  Records reveal that she has multiple seizures per day.  It is questionable as to whether these are true epileptic seizures versus nonepileptic seizure-like activity.  However when I evaluated the patient personally she remained extremely postictal and was unresponsive even to deep painful stimulation.  Case discussed with Dr. Cheral Marker who recommended admission to a progressive unit and will try to obtain EEG monitoring while in the hospital.   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  Multiple hospitalizations for the same over the past 2 months.  Neurology was consulted and followed during the hospital course.  Patient endorses compliance with her home medication regimen.  Patient was monitored on continuous EEG with 2 events captured on 08/06/2018 with tremor-like movements, but did not correlate with any seizure activity on EEG.  Patient's Keppra was discontinued for sedative side effect properties and patient was started on Lamictal.  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  Continue home Topamax and Neurontin.  Recommend psychology follow-up for cognitive behavioral therapy.  Will need follow-up with neurology outpatient.  Avoid alcohol, maintain good sleep hygiene.  Per [facility] statues, patients with seizures or not allowed to drive until they have been seizure-free for 6 months.  Acute renal failure: Resolved Patient presented with a  creatinine of 1.52, baseline 1.08.  Patient started on IV fluid hydration with resolution of her renal failure.  Creatinine at time of discharge 0.96.  Hypertension Continue home propranolol  Chronic anxiety/depression Continue Seroquel and Prozac  Hyperlipidemia Continue Lipitor  Chronic systolic CHF Appears euvolemic Last 2D echo done on January 25, 2018 revealed LVEF 45 to 50% with diffuse hypokinesis Continue beta-blocker  Discharge Diagnoses:  Principal Problem:   Uncontrolled seizures ([place]) Active Problems:   Obesity   Hypertension   Diabetes type 2, uncontrolled ([place])   Non compliance with medical treatment   Depression with anxiety    Discharge Instructions  Discharge Instructions    Call MD for:  difficulty breathing, headache or visual disturbances   Complete by: As directed    Call MD for:  persistant dizziness or light-headedness   Complete by: As directed    Call MD for:  persistant nausea and vomiting   Complete by: As directed    Call MD for:  redness, tenderness, or signs of infection (pain, swelling, redness, odor or green/yellow discharge around incision site)   Complete by: As directed    Call MD for:  severe uncontrolled pain   Complete by: As directed    Call MD for:  temperature >100.4   Complete by: As directed    Diet - low sodium heart healthy   Complete by: As directed    Increase activity slowly   Complete by: As directed      Allergies as of 08/08/2018      Reactions   Ibuprofen Other (See Comments)   GI MD said to not take this anymore      Medication List    STOP taking these medications   levETIRAcetam 500 MG tablet Commonly known as: KEPPRA     TAKE these medications   atorvastatin 20 MG tablet Commonly known as: Lipitor Take 1 tablet (20 mg total) by mouth daily.   diclofenac sodium 1 % Gel Commonly known as: VOLTAREN Apply 2 g topically 3 (three) times daily as needed (as directed to painful sites).    FLUoxetine 20 MG capsule Commonly known as: PROZAC Take 1 capsule (20 mg total) by mouth at bedtime. Please allow patient to pick up 30 day supply for free   gabapentin 300 MG capsule Commonly known as: NEURONTIN Take 1 capsule (300 mg total) by mouth at bedtime for 30 days.   glucagon 1 MG injection Use for Severe Hypoglycemia . Inject 1 mg intramuscularly if unresponsive, unable to swallow, unconscious and/or has seizure What changed:   how much to take  how to take this  when to take this  reasons to take this  additional instructions   glucose blood test strip Commonly known as: True Metrix Blood Glucose Test Use as instructed   insulin glargine 100 UNIT/ML injection Commonly known as: LANTUS Inject 0.2 mLs (20 Units total) into the skin 2 (two) times daily.   insulin lispro 100 UNIT/ML injection Commonly known as: HUMALOG Inject 0-0.15 mLs (0-15 Units total) into the skin 4 (four) times daily -  with meals and at bedtime. What changed: additional instructions   Insulin Pen Needle 31G X 5 MM Misc Commonly known as: B-D UF III MINI PEN NEEDLES Use as instructed. Monitor blood glucose levels twice per day  lamoTRIgine 25 MG tablet Commonly known as: LAMICTAL Take 1 tablet (25 mg total) by mouth 2 (two) times daily.   meloxicam 7.5 MG tablet Commonly known as: MOBIC Take 7.5 mg by mouth daily.   promethazine 25 MG tablet Commonly known as: PHENERGAN Take 1 tablet (25 mg total) by mouth every 8 (eight) hours as needed for nausea or vomiting.   propranolol 20 MG tablet Commonly known as: INDERAL Take 1 tablet (20 mg total) by mouth 2 (two) times daily. Please allow patient to pick up 30 day supply for free   QUEtiapine 100 MG tablet Commonly known as: SEROQUEL Take 400 mg by mouth at bedtime.   topiramate 25 MG tablet Commonly known as: Topamax Take 1 tablet (25 mg total) by mouth 2 (two) times daily. Please allow patient to pick up 30 day supply for  free   True Metrix Meter w/Device Kit Use as instructed. Monitor blood glucose levels twice per day   TRUEplus Lancets 28G Misc Use as instructed. Monitor blood glucose levels twice per day      Follow-up Information    [name], NP. Schedule an appointment as soon as possible for a visit in 1 week(s).   Specialty: Nurse Practitioner Contact information: [place] [address] [phone]        Fay Records, MD .   Specialty: Cardiology Contact information: [address] [place] [address] [phone]          Allergies  Allergen Reactions  . Ibuprofen Other (See Comments)    GI MD said to not take this anymore    Consultations:  Neurology, Dr. Cheral Marker   Procedures/Studies: Dg Chest 2 View  Result Date: 08/06/2018 CLINICAL DATA:  Seizures. EXAM: CHEST - 2 VIEW COMPARISON:  08/03/2018. FINDINGS: Mediastinum hilar structures normal. Heart size normal. Low lung volumes. No focal alveolar infiltrate. No pleural effusion or pneumothorax. No acute bony abnormality. IMPRESSION: Low lung volumes.  No acute cardiopulmonary disease identified. Electronically Signed   By: Marcello Moores  Register   On: 08/06/2018 06:24   Dg Thoracic Spine 2 View  Result Date: 08/06/2018 CLINICAL DATA:  Pt to ED for seizures. Per EMS, pt is having chest and abdominal pain. Pt completley non-responsive. Best obtainable imaging due to pt's condition EXAM: THORACIC SPINE 2 VIEWS COMPARISON:  None. FINDINGS: There is no evidence of thoracic spine fracture. Alignment is normal. No other significant bone abnormalities are identified. IMPRESSION: Negative. Electronically Signed   By: Lucrezia Europe M.D.   On: 08/06/2018 08:53   Dg Lumbar Spine Complete  Result Date: 08/06/2018 CLINICAL DATA:  Pt to ED for seizures. Per EMS, pt is having chest and abdominal pain. Pt completley non-responsive. Best obtainable imaging due to pt's condition EXAM: LUMBAR SPINE -  COMPLETE 4+ VIEW COMPARISON:  None. FINDINGS: There is no evidence of lumbar spine fracture. Alignment is normal. Intervertebral disc spaces are maintained. IMPRESSION: Negative. Electronically Signed   By: Lucrezia Europe M.D.   On: 08/06/2018 08:54   Ct Head Wo Contrast  Result Date: 08/06/2018 CLINICAL DATA:  Head trauma EXAM: CT HEAD WITHOUT CONTRAST CT CERVICAL SPINE WITHOUT CONTRAST TECHNIQUE: Multidetector CT imaging of the head and cervical spine was performed following the standard protocol without intravenous contrast. Multiplanar CT image reconstructions of the cervical spine were also generated. COMPARISON:  08/03/2018 FINDINGS: CT HEAD FINDINGS Brain: No evidence of acute infarction, hemorrhage, hydrocephalus, extra-axial collection or mass lesion/mass effect. Vascular: No hyperdense  vessel or unexpected calcification. Skull: Normal. Negative for fracture or focal lesion. Sinuses/Orbits: No acute finding. Other: None. CT CERVICAL SPINE FINDINGS Alignment: Normal. Skull base and vertebrae: No acute fracture. No primary bone lesion or focal pathologic process. Soft tissues and spinal canal: No prevertebral fluid or swelling. No visible canal hematoma. Disc levels:  Intact. Upper chest: Negative. Other: None. IMPRESSION: 1.  No acute intracranial pathology. 2.  No fracture or static subluxation of the cervical spine. Electronically Signed   By: Eddie Candle M.D.   On: 08/06/2018 08:54   Ct Head Wo Contrast  Result Date: 08/03/2018 CLINICAL DATA:  Ct head wo, Pt endorses having multiple seizures today, has hx and takes keppra. Has hx of DM and CHF. Tachy, axox4, slightly lethargic. EXAM: CT HEAD WITHOUT CONTRAST TECHNIQUE: Contiguous axial images were obtained from the base of the skull through the vertex without intravenous contrast. COMPARISON:  06/20/2018 FINDINGS: Brain: No evidence of acute infarction, hemorrhage, hydrocephalus, extra-axial collection or mass lesion/mass effect. Vascular: No  hyperdense vessel or unexpected calcification. Skull: Normal. Negative for fracture or focal lesion. Sinuses/Orbits: Normal globes and orbits.  Clear sinuses. Other: None. IMPRESSION: Normal unenhanced CT scan of the brain. Electronically Signed   By: Lajean Manes M.D.   On: 08/03/2018 16:19   Ct Cervical Spine Wo Contrast  Result Date: 08/06/2018 CLINICAL DATA:  Head trauma EXAM: CT HEAD WITHOUT CONTRAST CT CERVICAL SPINE WITHOUT CONTRAST TECHNIQUE: Multidetector CT imaging of the head and cervical spine was performed following the standard protocol without intravenous contrast. Multiplanar CT image reconstructions of the cervical spine were also generated. COMPARISON:  08/03/2018 FINDINGS: CT HEAD FINDINGS Brain: No evidence of acute infarction, hemorrhage, hydrocephalus, extra-axial collection or mass lesion/mass effect. Vascular: No hyperdense vessel or unexpected calcification. Skull: Normal. Negative for fracture or focal lesion. Sinuses/Orbits: No acute finding. Other: None. CT CERVICAL SPINE FINDINGS Alignment: Normal. Skull base and vertebrae: No acute fracture. No primary bone lesion or focal pathologic process. Soft tissues and spinal canal: No prevertebral fluid or swelling. No visible canal hematoma. Disc levels:  Intact. Upper chest: Negative. Other: None. IMPRESSION: 1.  No acute intracranial pathology. 2.  No fracture or static subluxation of the cervical spine. Electronically Signed   By: Eddie Candle M.D.   On: 08/06/2018 08:54   Ct Abdomen Pelvis W Contrast  Result Date: 07/25/2018 CLINICAL DATA:  Acute abdominal pain, generalized, with nausea. History of ovarian cyst. EXAM: CT ABDOMEN AND PELVIS WITH CONTRAST TECHNIQUE: Multidetector CT imaging of the abdomen and pelvis was performed using the standard protocol following bolus administration of intravenous contrast. CONTRAST:  14m OMNIPAQUE IOHEXOL 300 MG/ML  SOLN COMPARISON:  None. FINDINGS: Lower chest: No acute abnormality.  Hepatobiliary: No focal liver abnormality is seen. No gallstones, gallbladder wall thickening, or biliary dilatation. Pancreas: Unremarkable. No pancreatic ductal dilatation or surrounding inflammatory changes. Spleen: Normal in size without focal abnormality. Adrenals/Urinary Tract: Adrenal glands appear normal. Subtle low-density focus within the lower pole of the LEFT kidney, possible edema. No renal stone or hydronephrosis bilaterally. No perinephric fluid. No ureteral or bladder calculi identified. Bladder appears normal. Stomach/Bowel: No dilated large or small bowel loops. No convincing evidence of bowel wall inflammation. Stomach appears normal. Appendix is normal. Vascular/Lymphatic: No significant vascular findings are present. No enlarged abdominal or pelvic lymph nodes. Reproductive: Mild RIGHT adnexal fullness, probable small chronic ovarian cyst. LEFT adnexal regions unremarkable. No free fluid or inflammatory change within either adnexal region Other: No free fluid or abscess collection  seen. No free intraperitoneal air. Musculoskeletal: No acute or suspicious osseous finding. IMPRESSION: 1. Subtle low-density focus within the lower pole of the LEFT kidney, possible edema. Recommend correlation with urinalysis for any evidence of pyelonephritis. 2. Mild RIGHT adnexal fullness, probable small chronic ovarian cyst. No free fluid or inflammatory change within either adnexal region. Consider nonemergent pelvic ultrasound at some point to ensure benignity. 3. Remainder of the abdomen and pelvis CT is unremarkable, as detailed above. No bowel obstruction or evidence of bowel wall inflammation. No evidence of acute solid organ abnormality. No renal or ureteral calculi. Appendix is normal. Electronically Signed   By: Franki Cabot M.D.   On: 07/25/2018 04:54   Dg Chest Portable 1 View  Result Date: 08/03/2018 CLINICAL DATA:  Screening exam EXAM: PORTABLE CHEST 1 VIEW COMPARISON:  Six days ago FINDINGS:  Low volume chest with rotation. Normal heart size and mediastinal contours. No acute infiltrate or edema. No effusion or pneumothorax. No acute osseous findings. IMPRESSION: Negative low volume chest. Electronically Signed   By: Monte Fantasia M.D.   On: 08/03/2018 05:27   Dg Chest Port 1 View  Result Date: 07/28/2018 CLINICAL DATA:  Chest pain. EXAM: PORTABLE CHEST 1 VIEW COMPARISON:  Multiple prior exams most recent radiograph 3 days ago. FINDINGS: The cardiomediastinal contours are normal. The lungs are clear. Pulmonary vasculature is normal. No consolidation, pleural effusion, or pneumothorax. No acute osseous abnormalities are seen. IMPRESSION: Negative AP view of the chest. Electronically Signed   By: Keith Rake M.D.   On: 07/28/2018 23:47   Dg Chest Port 1 View  Result Date: 07/25/2018 CLINICAL DATA:  Pain. Emesis. EXAM: PORTABLE CHEST 1 VIEW COMPARISON:  07/05/2018 FINDINGS: The cardiomediastinal contours are normal. The lungs are clear. Pulmonary vasculature is normal. No consolidation, pleural effusion, or pneumothorax. No acute osseous abnormalities are seen. IMPRESSION: No acute chest findings. Electronically Signed   By: Keith Rake M.D.   On: 07/25/2018 03:07     EEG: EEG reviewed till 7am. Two events were captured on 08/06/2018 at 1822 and 2009. During both events, patient started having tremor like movements in her right leg which gradually progressed to whole body tremoring. Eyes were open during first event ( unable to clearly see during second) and she responded to RN shaking her head sideways when asked if she can stop the movement. It abruptly stopped after about 1 minute. Concomitant EEG did not show seizure before, during and after the event.   Subjective: Patient seen and examined at bedside, resting comfortably.  No complaints this morning.  Denies headache, no fever/chills/night sweats, no chest pain, no palpitations, no shortness of breath, no abdominal pain.  No  acute events overnight per nursing staff.   Discharge Exam: Vitals:   08/08/18 1153 08/08/18 1154  BP:  107/66  Pulse:  73  Resp:  19  Temp: 98.6 F (37 C) 98.5 F (36.9 C)  SpO2:  100%   Vitals:   08/08/18 0422 08/08/18 0951 08/08/18 1153 08/08/18 1154  BP:  112/67  107/66  Pulse:  93  73  Resp:  18  19  Temp: 98.3 F (36.8 C) 98.4 F (36.9 C) 98.6 F (37 C) 98.5 F (36.9 C)  TempSrc:  Oral Oral Oral  SpO2:  100%  100%  Weight:      Height:        General: Pt is alert, awake, not in acute distress, obese Cardiovascular: RRR, S1/S2 +, no rubs, no gallops Respiratory: CTA  bilaterally, no wheezing, no rhonchi Abdominal: Soft, NT, ND, bowel sounds + Extremities: no edema, no cyanosis    The results of significant diagnostics from this hospitalization (including imaging, microbiology, ancillary and laboratory) are listed below for reference.     Microbiology: Recent Results (from the past 240 hour(s))  SARS Coronavirus 2 (CEPHEID - Performed in [place] hospital lab), Hosp Order     Status: None   Collection Time: 08/03/18  4:59 AM   Specimen: Nasopharyngeal Swab  Result Value Ref Range Status   SARS Coronavirus 2 NEGATIVE NEGATIVE Final    Comment: (NOTE) If result is NEGATIVE SARS-CoV-2 target nucleic acids are NOT DETECTED. The SARS-CoV-2 RNA is generally detectable in upper and lower  respiratory specimens during the acute phase of infection. The lowest  concentration of SARS-CoV-2 viral copies this assay can detect is 250  copies / mL. A negative result does not preclude SARS-CoV-2 infection  and should not be used as the sole basis for treatment or other  patient management decisions.  A negative result may occur with  improper specimen collection / handling, submission of specimen other  than nasopharyngeal swab, presence of viral mutation(s) within the  areas targeted by this assay, and inadequate number of viral copies  (<250 copies / mL). A  negative result must be combined with clinical  observations, patient history, and epidemiological information. If result is POSITIVE SARS-CoV-2 target nucleic acids are DETECTED. The SARS-CoV-2 RNA is generally detectable in upper and lower  respiratory specimens dur ing the acute phase of infection.  Positive  results are indicative of active infection with SARS-CoV-2.  Clinical  correlation with patient history and other diagnostic information is  necessary to determine patient infection status.  Positive results do  not rule out bacterial infection or co-infection with other viruses. If result is PRESUMPTIVE POSTIVE SARS-CoV-2 nucleic acids MAY BE PRESENT.   A presumptive positive result was obtained on the submitted specimen  and confirmed on repeat testing.  While 2019 novel coronavirus  (SARS-CoV-2) nucleic acids may be present in the submitted sample  additional confirmatory testing may be necessary for epidemiological  and / or clinical management purposes  to differentiate between  SARS-CoV-2 and other Sarbecovirus currently known to infect humans.  If clinically indicated additional testing with an alternate test  methodology [phone]) is advised. The SARS-CoV-2 RNA is generally  detectable in upper and lower respiratory sp ecimens during the acute  phase of infection. The expected result is Negative. Fact Sheet for Patients:  StrictlyIdeas.no Fact Sheet for Healthcare Providers: BankingDealers.co.za This test is not yet approved or cleared by the [place] FDA and has been authorized for detection and/or diagnosis of SARS-CoV-2 by FDA under an Emergency Use Authorization (EUA).  This EUA will remain in effect (meaning this test can be used) for the duration of the COVID-19 declaration under Section 564(b)(1) of the Act, 21 U.S.C. section 360bbb-3(b)(1), unless the authorization is terminated or revoked sooner. Performed  at [facility], [place] [address], [place], [place] [address]   MRSA PCR Screening     Status: None   Collection Time: 08/06/18  4:19 PM   Specimen: Nasopharyngeal  Result Value Ref Range Status   MRSA by PCR NEGATIVE NEGATIVE Final    Comment:        The GeneXpert MRSA Assay (FDA approved for NASAL specimens only), is one component of a comprehensive MRSA colonization surveillance program. It is not intended to diagnose MRSA infection nor  to guide or monitor treatment for MRSA infections. Performed at [facility], [place] [address] [place], [place] [address]      Labs: BNP (last 3 results) Recent Labs    01/16/18 1830 01/24/18 1313 01/29/18 2134  BNP 5.6 4.8 60.4   Basic Metabolic Panel: Recent Labs  Lab 08/03/18 1720 08/04/18 0345 08/05/18 0334 08/06/18 0604 08/06/18 1416 08/07/18 0639 08/08/18 0406  NA  --  141 141 139  --  143 139  K  --  3.3* 3.5 3.7  --  2.9* 4.1  CL  --  113* 113* 108  --  112* 107  CO2  --  21* 21* 19*  --  27 25  GLUCOSE  --  173* 238* 291*  --  110* 157*  BUN  --  5* 9 7  --  <5* 10  CREATININE  --  1.33* 1.08* 1.52* 1.37* 1.23* 0.96  CALCIUM  --  8.2* 8.6* 9.1  --  8.1* 8.8*  MG 1.5*  --   --   --   --   --  2.0   Liver Function Tests: Recent Labs  Lab 08/03/18 0511 08/06/18 1204  AST 14* 15  ALT 12 13  ALKPHOS 63 57  BILITOT 0.4 0.5  PROT 6.7 6.7  ALBUMIN 3.9 3.8   Recent Labs  Lab 08/06/18 0604  LIPASE 25   No results for input(s): AMMONIA in the last 168 hours. CBC: Recent Labs  Lab 08/03/18 0511 08/03/18 0608 08/04/18 0345 08/06/18 0604 08/06/18 1416 08/07/18 0639  WBC 9.9  --  10.1 8.7 7.3 6.1  NEUTROABS 5.4  --   --   --   --   --   HGB 13.8 16.0* 12.5 12.9 11.8* 11.4*  HCT 42.2 47.0* 38.1 39.9 36.2 35.3*  MCV 87.0  --  86.6 88.7 87.2 87.6  PLT 338  --  345 380 291 383   Cardiac Enzymes: Recent Labs  Lab 08/03/18 1720  CKTOTAL 41   BNP: Invalid input(s): POCBNP CBG: Recent Labs   Lab 08/07/18 2005 08/08/18 0010 08/08/18 0414 08/08/18 0757 08/08/18 1144  GLUCAP 186* 210* 136* 113* 238*   D-Dimer No results for input(s): DDIMER in the last 72 hours. Hgb A1c No results for input(s): HGBA1C in the last 72 hours. Lipid Profile No results for input(s): CHOL, HDL, LDLCALC, TRIG, CHOLHDL, LDLDIRECT in the last 72 hours. Thyroid function studies No results for input(s): TSH, T4TOTAL, T3FREE, THYROIDAB in the last 72 hours.  Invalid input(s): FREET3 Anemia work up No results for input(s): VITAMINB12, FOLATE, FERRITIN, TIBC, IRON, RETICCTPCT in the last 72 hours. Urinalysis    Component Value Date/Time   COLORURINE STRAW (A) 08/06/2018 0901   APPEARANCEUR CLEAR 08/06/2018 0901   LABSPEC 1.008 08/06/2018 0901   PHURINE 6.0 08/06/2018 0901   GLUCOSEU >=500 (A) 08/06/2018 0901   HGBUR MODERATE (A) 08/06/2018 0901   BILIRUBINUR NEGATIVE 08/06/2018 0901   BILIRUBINUR negative 07/13/2018 1651   KETONESUR 20 (A) 08/06/2018 0901   PROTEINUR NEGATIVE 08/06/2018 0901   UROBILINOGEN 0.2 07/13/2018 1651   UROBILINOGEN 1.0 07/09/2017 1324   NITRITE NEGATIVE 08/06/2018 0901   LEUKOCYTESUR NEGATIVE 08/06/2018 0901   Sepsis Labs Invalid input(s): PROCALCITONIN,  WBC,  LACTICIDVEN Microbiology Recent Results (from the past 240 hour(s))  SARS Coronavirus 2 (CEPHEID - Performed in [facility] hospital lab), Hosp Order     Status: None   Collection Time: 08/03/18  4:59 AM   Specimen: Nasopharyngeal  Swab  Result Value Ref Range Status   SARS Coronavirus 2 NEGATIVE NEGATIVE Final    Comment: (NOTE) If result is NEGATIVE SARS-CoV-2 target nucleic acids are NOT DETECTED. The SARS-CoV-2 RNA is generally detectable in upper and lower  respiratory specimens during the acute phase of infection. The lowest  concentration of SARS-CoV-2 viral copies this assay can detect is 250  copies / mL. A negative result does not preclude SARS-CoV-2 infection  and should not be used as the  sole basis for treatment or other  patient management decisions.  A negative result may occur with  improper specimen collection / handling, submission of specimen other  than nasopharyngeal swab, presence of viral mutation(s) within the  areas targeted by this assay, and inadequate number of viral copies  (<250 copies / mL). A negative result must be combined with clinical  observations, patient history, and epidemiological information. If result is POSITIVE SARS-CoV-2 target nucleic acids are DETECTED. The SARS-CoV-2 RNA is generally detectable in upper and lower  respiratory specimens dur ing the acute phase of infection.  Positive  results are indicative of active infection with SARS-CoV-2.  Clinical  correlation with patient history and other diagnostic information is  necessary to determine patient infection status.  Positive results do  not rule out bacterial infection or co-infection with other viruses. If result is PRESUMPTIVE POSTIVE SARS-CoV-2 nucleic acids MAY BE PRESENT.   A presumptive positive result was obtained on the submitted specimen  and confirmed on repeat testing.  While 2019 novel coronavirus  (SARS-CoV-2) nucleic acids may be present in the submitted sample  additional confirmatory testing may be necessary for epidemiological  and / or clinical management purposes  to differentiate between  SARS-CoV-2 and other Sarbecovirus currently known to infect humans.  If clinically indicated additional testing with an alternate test  methodology [phone]) is advised. The SARS-CoV-2 RNA is generally  detectable in upper and lower respiratory sp ecimens during the acute  phase of infection. The expected result is Negative. Fact Sheet for Patients:  StrictlyIdeas.no Fact Sheet for Healthcare Providers: BankingDealers.co.za This test is not yet approved or cleared by the [place] FDA and has been authorized for detection  and/or diagnosis of SARS-CoV-2 by FDA under an Emergency Use Authorization (EUA).  This EUA will remain in effect (meaning this test can be used) for the duration of the COVID-19 declaration under Section 564(b)(1) of the Act, 21 U.S.C. section 360bbb-3(b)(1), unless the authorization is terminated or revoked sooner. Performed at [facility], [place] [address], [place], [place] [address]   MRSA PCR Screening     Status: None   Collection Time: 08/06/18  4:19 PM   Specimen: Nasopharyngeal  Result Value Ref Range Status   MRSA by PCR NEGATIVE NEGATIVE Final    Comment:        The GeneXpert MRSA Assay (FDA approved for NASAL specimens only), is one component of a comprehensive MRSA colonization surveillance program. It is not intended to diagnose MRSA infection nor to guide or monitor treatment for MRSA infections. Performed at [name], [place] [address] [place], [place] [address]      Time coordinating discharge: Over 30 minutes  SIGNED:    J [place] DO  [facility] 08/08/2018, 12:44 PM

## 2018-08-09 ENCOUNTER — Telehealth: Payer: Self-pay

## 2018-08-09 NOTE — Telephone Encounter (Signed)
Transition Care Management Follow-up Telephone Call Date of discharge and from where: 08/08/2018, [facility]   Call placed to patient # [phone], and message noted that call not able to be completed at this time

## 2018-08-10 ENCOUNTER — Telehealth: Payer: Self-pay

## 2018-08-10 NOTE — Telephone Encounter (Addendum)
Transition Care Management Follow-up Telephone Call    Date of discharge and from where: 08/08/2018, [facility]  1032- Call placed to # [phone] and the message notes that calls are not being received at this time.    1615 - attempted to contact patient again, message still notes that call can't be complete at this time.

## 2018-08-12 ENCOUNTER — Other Ambulatory Visit: Payer: Self-pay

## 2018-08-12 ENCOUNTER — Telehealth: Payer: Medicaid Other | Admitting: Neurology

## 2018-08-25 ENCOUNTER — Other Ambulatory Visit: Payer: Self-pay

## 2018-08-25 ENCOUNTER — Emergency Department (HOSPITAL_COMMUNITY)
Admission: EM | Admit: 2018-08-25 | Discharge: 2018-08-25 | Disposition: A | Discharge status: Home / Self Care | Payer: Medicaid Other | Attending: Emergency Medicine | Admitting: Emergency Medicine

## 2018-08-25 ENCOUNTER — Encounter (HOSPITAL_COMMUNITY): Payer: Self-pay | Admitting: Emergency Medicine

## 2018-08-25 DIAGNOSIS — R51 Headache: Secondary | ICD-10-CM | POA: Diagnosis not present

## 2018-08-25 DIAGNOSIS — Z79899 Other long term (current) drug therapy: Secondary | ICD-10-CM | POA: Insufficient documentation

## 2018-08-25 DIAGNOSIS — Z9114 Patient's other noncompliance with medication regimen: Secondary | ICD-10-CM | POA: Insufficient documentation

## 2018-08-25 DIAGNOSIS — I110 Hypertensive heart disease with heart failure: Secondary | ICD-10-CM | POA: Insufficient documentation

## 2018-08-25 DIAGNOSIS — I509 Heart failure, unspecified: Secondary | ICD-10-CM | POA: Diagnosis not present

## 2018-08-25 DIAGNOSIS — G8929 Other chronic pain: Secondary | ICD-10-CM | POA: Diagnosis not present

## 2018-08-25 DIAGNOSIS — R109 Unspecified abdominal pain: Secondary | ICD-10-CM | POA: Diagnosis present

## 2018-08-25 DIAGNOSIS — R55 Syncope and collapse: Secondary | ICD-10-CM | POA: Diagnosis not present

## 2018-08-25 DIAGNOSIS — R1084 Generalized abdominal pain: Secondary | ICD-10-CM | POA: Diagnosis not present

## 2018-08-25 DIAGNOSIS — R112 Nausea with vomiting, unspecified: Secondary | ICD-10-CM | POA: Diagnosis not present

## 2018-08-25 DIAGNOSIS — E1165 Type 2 diabetes mellitus with hyperglycemia: Secondary | ICD-10-CM

## 2018-08-25 DIAGNOSIS — Z794 Long term (current) use of insulin: Secondary | ICD-10-CM | POA: Diagnosis not present

## 2018-08-25 LAB — URINALYSIS, ROUTINE W REFLEX MICROSCOPIC
Bilirubin Urine: NEGATIVE
Glucose, UA: >=500 mg/dL — AB
Hgb urine dipstick: NEGATIVE
Ketones, ur: NEGATIVE mg/dL
Leukocytes,Ua: NEGATIVE
Nitrite: NEGATIVE
Protein, ur: 30 mg/dL — AB
Specific Gravity, Urine: 1.027 (ref 1.005–1.030)
pH: 5 (ref 5.0–8.0)

## 2018-08-25 LAB — COMPREHENSIVE METABOLIC PANEL
ALT: 18 U/L (ref 0–44)
AST: 29 U/L (ref 15–41)
Albumin: 3.7 g/dL (ref 3.5–5.0)
Alkaline Phosphatase: 71 U/L (ref 38–126)
Anion gap: 11 (ref 5–15)
BUN: 7 mg/dL (ref 6–20)
CO2: 20 mmol/L — ABNORMAL LOW (ref 22–32)
Calcium: 9.1 mg/dL (ref 8.9–10.3)
Chloride: 101 mmol/L (ref 98–111)
Creatinine, Ser: 0.89 mg/dL (ref 0.44–1.00)
GFR calc Af Amer: >60 mL/min (ref >60)
GFR calc non Af Amer: >60 mL/min (ref >60)
Glucose, Bld: 378 mg/dL — ABNORMAL HIGH (ref 70–99)
Potassium: 4.9 mmol/L (ref 3.5–5.1)
Sodium: 132 mmol/L — ABNORMAL LOW (ref 135–145)
Total Bilirubin: 0.8 mg/dL (ref 0.3–1.2)
Total Protein: 6.4 g/dL — ABNORMAL LOW (ref 6.5–8.1)

## 2018-08-25 LAB — CBC WITH DIFFERENTIAL/PLATELET
Abs Immature Granulocytes: 0.03 10*3/uL (ref 0.00–0.07)
Basophils Absolute: 0.1 10*3/uL (ref 0.0–0.1)
Basophils Relative: 1 %
Eosinophils Absolute: 0.1 10*3/uL (ref 0.0–0.5)
Eosinophils Relative: 1 %
HCT: 38.5 % (ref 36.0–46.0)
Hemoglobin: 12.1 g/dL (ref 12.0–15.0)
Immature Granulocytes: 0 %
Lymphocytes Relative: 28 %
Lymphs Abs: 2.4 10*3/uL (ref 0.7–4.0)
MCH: 28 pg (ref 26.0–34.0)
MCHC: 31.4 g/dL (ref 30.0–36.0)
MCV: 89.1 fL (ref 80.0–100.0)
Monocytes Absolute: 1 10*3/uL (ref 0.1–1.0)
Monocytes Relative: 11 %
Neutro Abs: 5 10*3/uL (ref 1.7–7.7)
Neutrophils Relative %: 59 %
Platelets: 244 10*3/uL (ref 150–400)
RBC: 4.32 MIL/uL (ref 3.87–5.11)
RDW: 12.5 % (ref 11.5–15.5)
WBC: 8.6 10*3/uL (ref 4.0–10.5)
nRBC: 0 % (ref 0.0–0.2)

## 2018-08-25 LAB — I-STAT BETA HCG BLOOD, ED (MC, WL, AP ONLY): I-stat hCG, quantitative: <5 m[IU]/mL (ref <5)

## 2018-08-25 LAB — LIPASE, BLOOD: Lipase: 20 U/L (ref 11–51)

## 2018-08-25 LAB — CBC
HCT: 38 % (ref 36.0–46.0)
Hemoglobin: 12.2 g/dL (ref 12.0–15.0)
MCH: 28.5 pg (ref 26.0–34.0)
MCHC: 32.1 g/dL (ref 30.0–36.0)
MCV: 88.8 fL (ref 80.0–100.0)
Platelets: 247 10*3/uL (ref 150–400)
RBC: 4.28 MIL/uL (ref 3.87–5.11)
RDW: 12.6 % (ref 11.5–15.5)
WBC: 8.9 10*3/uL (ref 4.0–10.5)
nRBC: 0 % (ref 0.0–0.2)

## 2018-08-25 LAB — CBG MONITORING, ED
Glucose-Capillary: 357 mg/dL — ABNORMAL HIGH (ref 70–99)
Glucose-Capillary: 362 mg/dL — ABNORMAL HIGH (ref 70–99)

## 2018-08-25 MED ORDER — FENTANYL CITRATE (PF) 100 MCG/2ML IJ SOLN
25.0000 ug | Freq: Once | INTRAMUSCULAR | Status: AC
  Administered 2018-08-25: 25 ug via INTRAVENOUS
  Filled 2018-08-25: qty 2

## 2018-08-25 MED ORDER — INSULIN GLARGINE 100 UNIT/ML ~~LOC~~ SOLN
20.0000 [IU] | Freq: Once | SUBCUTANEOUS | Status: AC
  Administered 2018-08-25: 20 [IU] via SUBCUTANEOUS
  Filled 2018-08-25: qty 0.2

## 2018-08-25 MED ORDER — ONDANSETRON HCL 4 MG/2ML IJ SOLN
4.0000 mg | Freq: Once | INTRAMUSCULAR | Status: AC
  Administered 2018-08-25: 4 mg via INTRAVENOUS
  Filled 2018-08-25: qty 2

## 2018-08-25 MED ORDER — DICYCLOMINE HCL 10 MG PO CAPS
10.0000 mg | ORAL_CAPSULE | Freq: Once | ORAL | Status: AC
  Administered 2018-08-25: 10 mg via ORAL
  Filled 2018-08-25: qty 1

## 2018-08-25 MED ORDER — FENTANYL CITRATE (PF) 100 MCG/2ML IJ SOLN
50.0000 ug | Freq: Once | INTRAMUSCULAR | Status: AC
  Administered 2018-08-25: 50 ug via INTRAVENOUS
  Filled 2018-08-25: qty 2

## 2018-08-25 MED ORDER — SODIUM CHLORIDE 0.9 % IV BOLUS
500.0000 mL | Freq: Once | INTRAVENOUS | Status: AC
  Administered 2018-08-25: 500 mL via INTRAVENOUS

## 2018-08-25 NOTE — ED Triage Notes (Signed)
Pt brought in by [facility] from home. Pt reports abdominal pain x 2 weeks, N/V x 3 days, weakness and hyperglycemia. Pt has hx of DM, takes insulin but does not check her CBGs. She denies diarrhea.

## 2018-08-25 NOTE — Discharge Instructions (Signed)
Your lab results are reassuring today. Take your phenergan as prescribed as needed for nausea. It is important that you take your insulin as prescribed and regularly check your blood sugar. You can take over the counter medications as needed for abdominal pain. Follow up closely with your primary care provider.

## 2018-08-25 NOTE — ED Provider Notes (Signed)
[facility] EMERGENCY DEPARTMENT Provider Note   CSN: [id number] Arrival date & time: 08/25/18  1127    History   Chief Complaint Chief Complaint  Patient presents with  . Abdominal Pain  . Emesis    HPI Nancy [name] is a 18 y.o. female with past medical history of insulin-dependent type 2 diabetes, CHF, hypertension, chronic abdominal pain, presenting to the emergency department with complaint of 2 weeks of intermittent epigastric abdominal pain.  She developed associated nausea and vomiting 3 days ago, about 4 episodes of vomiting per day.  She denies associated hematemesis, diarrhea or constipation, fever.  She states she had some syncopal episodes today.  Her mother reports she had 2 episodes appear to be her usual episodes of syncope, these are not new.   Her mother reports there was no convulsive-like movement. She states she had the back of her head and has some localized pain, however no generalized headache or vision change.  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  She is currently taking Lamictal and compliant.     The history is provided by the patient, medical records and a parent.    Past Medical History:  Diagnosis Date  . Acanthosis nigricans   . Anxiety   . CHF (congestive heart failure) (HCC)   . Chronic lower back pain   . Depression   . Dyspepsia   . Obesity   . Ovarian cyst    pt is not aware of this hx (11/24/2017)  . Pre-diabetes   . 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 Premature baby   . Seizures (HCC)   . Type II diabetes mellitus (HCC)    insulin dependant    Patient Active Problem List   Diagnosis Date Noted  . Hypokalemia 08/04/2018  . Hypomagnesemia 08/04/2018  . Acute kidney injury (HCC) 08/04/2018  . Seizure (HCC) 08/03/2018  . Depression with anxiety 07/25/2018  . DKA (diabetic  ketoacidoses) (HCC) 07/25/2018  . Uncontrolled seizures (HCC) 06/14/2018  . Syncope 01/30/2018  . Orthostatic hypotension 01/24/2018  . DKA (diabetic ketoacidosis) (HCC) 01/24/2018  . Chronic abdominal pain 12/24/2017  . Sinus tachycardia by electrocardiogram   . Acute lower UTI   . Uncontrolled type 2 diabetes mellitus with hyperglycemia (HCC)   . Chest pain 12/19/2017  . Generalized abdominal pain 08/21/2017  . Non compliance with medical treatment 01/27/2012  . Adjustment disorder 09/16/2011  . Diabetes type 2, uncontrolled (HCC) 09/15/2011  . Dyspepsia   . Acanthosis nigricans   . Goiter   . Obesity 06/14/2010  . Hypertension 06/14/2010    Past Surgical History:  Procedure Laterality Date  . ABDOMINAL HERNIA REPAIR     "I was a baby"  . HERNIA REPAIR    . TONSILLECTOMY AND ADENOIDECTOMY    . WISDOM TOOTH EXTRACTION  2017     OB History   No obstetric history on file.      Home Medications    Prior to Admission medications   Medication Sig Start Date End Date Taking? Authorizing Provider  atorvastatin (LIPITOR) 20 MG tablet Take 1 tablet (20 mg total) by mouth daily. 07/26/18 10/24/18  Gonfa, Taye T, MD  Blood Glucose Monitoring Suppl (TRUE METRIX METER) w/Device KIT Use as instructed. Monitor blood glucose levels twice per day 12/02/17   Fleming, Zelda W, NP  diclofenac sodium (VOLTAREN) 1 % GEL Apply 2 g   topically 3 (three) times daily as needed (as directed to painful sites).     [provider]  FLUoxetine (PROZAC) 20 MG capsule Take 1 capsule (20 mg total) by mouth at bedtime. Please allow patient to pick up 30 day supply for free 07/13/18 08/12/18  Fleming, Zelda W, NP  gabapentin (NEURONTIN) 300 MG capsule Take 1 capsule (300 mg total) by mouth at bedtime for 30 days. 06/02/18 07/05/27  Fleming, Zelda W, NP  glucagon 1 MG injection Use for Severe Hypoglycemia . Inject 1 mg intramuscularly if unresponsive, unable to swallow, unconscious and/or has seizure  Patient taking differently: Inject 1 mg into the muscle once as needed (for severe hypoglycemia- if unresponsive, unable to swallow, unconscious, and/or has had seizure(s)).  07/20/13 12/19/25  Ellison, Sean, MD  glucose blood (TRUE METRIX BLOOD GLUCOSE TEST) test strip Use as instructed 02/15/18   Fleming, Zelda W, NP  insulin glargine (LANTUS) 100 UNIT/ML injection Inject 0.2 mLs (20 Units total) into the skin 2 (two) times daily. 07/26/18   Gonfa, Taye T, MD  insulin lispro (HUMALOG) 100 UNIT/ML injection Inject 0-0.15 mLs (0-15 Units total) into the skin 4 (four) times daily -  with meals and at bedtime. Patient taking differently: Inject 0-15 Units into the skin 4 (four) times daily -  with meals and at bedtime. Sliding scale 07/26/18 08/25/18  Gonfa, Taye T, MD  Insulin Pen Needle (B-D UF III MINI PEN NEEDLES) 31G X 5 MM MISC Use as instructed. Monitor blood glucose levels twice per day 02/15/18   Fleming, Zelda W, NP  lamoTRIgine (LAMICTAL) 25 MG tablet Take 1 tablet (25 mg total) by mouth 2 (two) times daily. 08/08/18   [place], Eric J, DO  meloxicam (MOBIC) 7.5 MG tablet Take 7.5 mg by mouth daily.    [provider]  promethazine (PHENERGAN) 25 MG tablet Take 1 tablet (25 mg total) by mouth every 8 (eight) hours as needed for nausea or vomiting. 07/29/18   Wickline, Donald, MD  propranolol (INDERAL) 20 MG tablet Take 1 tablet (20 mg total) by mouth 2 (two) times daily. Please allow patient to pick up 30 day supply for free 07/13/18 08/12/18  Fleming, Zelda W, NP  QUEtiapine (SEROQUEL) 100 MG tablet Take 400 mg by mouth at bedtime.     [provider]  topiramate (TOPAMAX) 25 MG tablet Take 1 tablet (25 mg total) by mouth 2 (two) times daily. Please allow patient to pick up 30 day supply for free 07/13/18 08/12/18  Fleming, Zelda W, NP  TRUEPLUS LANCETS 28G MISC Use as instructed. Monitor blood glucose levels twice per day 12/02/17   Fleming, Zelda W, NP    Family History Family History   Problem Relation Age of Onset  . Diabetes Mother   . Hypertension Mother   . Obesity Mother   . Asthma Mother   . Allergic rhinitis Mother   . Eczema Mother   . Diabetes Father   . Hypertension Father   . Obesity Father   . Hyperlipidemia Father   . Hypertension Paternal Aunt   . Hypertension Maternal Grandfather   . Colon cancer Maternal Grandfather   . Diabetes Paternal Grandmother   . Obesity Paternal Grandmother   . Diabetes Paternal Grandfather   . Obesity Paternal Grandfather   . Angioedema Neg Hx   . Immunodeficiency Neg Hx   . Urticaria Neg Hx   . Stomach cancer Neg Hx   . Esophageal cancer Neg Hx       Social History Social History   Tobacco Use  . Smoking status: Never Smoker  . Smokeless tobacco: Never Used  Substance Use Topics  . Alcohol use: No    Alcohol/week: 0.0 standard drinks  . Drug use: No     Allergies   Ibuprofen   Review of Systems Review of Systems  Constitutional: Negative for fever.  Gastrointestinal: Positive for abdominal pain, nausea and vomiting.  Neurological: Positive for syncope.  All other systems reviewed and are negative.    Physical Exam Updated Vital Signs BP 123/87   Pulse (!) 118   Temp 99.3 F (37.4 C) (Oral)   Resp 19   Ht 5' 3" (1.6 m)   Wt 84.4 kg   LMP 08/05/2018   SpO2 100%   BMI 32.95 kg/m   Physical Exam Vitals signs and nursing note reviewed.  Constitutional:      General: She is not in acute distress.    Appearance: She is well-developed.     Comments: Poor hygiene.  HENT:     Head: Normocephalic and atraumatic.     Comments: No scalp hematoma or deformity Eyes:     Extraocular Movements: Extraocular movements intact.     Conjunctiva/sclera: Conjunctivae normal.     Pupils: Pupils are equal, round, and reactive to light.  Cardiovascular:     Rate and Rhythm: Regular rhythm. Tachycardia present.  Pulmonary:     Effort: Pulmonary effort is normal. No respiratory distress.     Breath  sounds: Normal breath sounds.  Abdominal:     General: Bowel sounds are normal.     Palpations: Abdomen is soft.     Tenderness: There is abdominal tenderness (Generalized). There is no guarding or rebound.  Musculoskeletal:     Right lower leg: No edema.     Left lower leg: No edema.  Skin:    General: Skin is warm.  Neurological:     Mental Status: She is alert.     Comments: Alert and oriented.  Cranial nerves grossly intact.  Equal strength bilateral upper and lower extremities.  Normal tone. spontaneous moving all extremities without difficulty.  Normal coordination.  Psychiatric:        Behavior: Behavior normal.      ED Treatments / Results  Labs (all labs ordered are listed, but only abnormal results are displayed) Labs Reviewed  COMPREHENSIVE METABOLIC PANEL - Abnormal; Notable for the following components:      Result Value   Sodium 132 (*)    CO2 20 (*)    Glucose, Bld 378 (*)    Total Protein 6.4 (*)    All other components within normal limits  URINALYSIS, ROUTINE W REFLEX MICROSCOPIC - Abnormal; Notable for the following components:   Color, Urine YELLOW (*)    APPearance CLOUDY (*)    Glucose, UA >=500 (*)    Protein, ur 30 (*)    Bacteria, UA FEW (*)    All other components within normal limits  CBG MONITORING, ED - Abnormal; Notable for the following components:   Glucose-Capillary 357 (*)    All other components within normal limits  CBG MONITORING, ED - Abnormal; Notable for the following components:   Glucose-Capillary 362 (*)    All other components within normal limits  LIPASE, BLOOD  CBC  CBC WITH DIFFERENTIAL/PLATELET  I-STAT BETA HCG BLOOD, ED ([facility], [facility], [facility] ONLY)    EKG EKG Interpretation  Date/Time:  Wednesday August 25 2018 11:31:23 EDT Ventricular Rate:  121  PR Interval:    QRS Duration: 104 QT Interval:  323 QTC Calculation: 459 R Axis:   49 Text Interpretation:  Sinus tachycardia Borderline low voltage, extremity leads ST elev,  probable normal early repol pattern Confirmed by DeLo, Douglas ([id number]) on 08/25/2018 12:52:42 PM   Radiology No results found.  Procedures Procedures (including critical care time)  Medications Ordered in ED Medications  insulin glargine (LANTUS) injection 20 Units (has no administration in time range)  sodium chloride 0.9 % bolus 500 mL (0 mLs Intravenous Stopped 08/25/18 1508)  fentaNYL (SUBLIMAZE) injection 25 mcg (25 mcg Intravenous Given 08/25/18 1257)  ondansetron (ZOFRAN) injection 4 mg (4 mg Intravenous Given 08/25/18 1256)  fentaNYL (SUBLIMAZE) injection 50 mcg (50 mcg Intravenous Given 08/25/18 1506)  dicyclomine (BENTYL) capsule 10 mg (10 mg Oral Given 08/25/18 1507)     Initial Impression / Assessment and Plan / ED Course  I have reviewed the triage vital signs and the nursing notes.  Pertinent labs & imaging results that were available during my care of the patient were reviewed by me and considered in my medical decision making (see chart for details).  Clinical Course as of Aug 24 1601  Wed Aug 25, 2018  1322 Pt discussed with Dr. Delo. Will hydrate, recheck CBG and VS.   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 No seizure-like activity per pt's mother who witnessed the events. No focal neuro deficits on exam, no scalp hematoma. CBG elevated though normal anion gap. No arrhythmia on EKG.  Her symptoms were treated in the ED with improvement.  After further discussion, pt reports she did not take her morning dose of insulin. Will administer her home dose. Pt discussed with and evaluated by Dr. Delo. At this time, will discharge after insulin administration with instructions to take her home phenergan as needed for nausea.  Suspect abd pain is likely multifactorial; low suspicion for acute or emergent pathology given duration of symptoms and nonfocal abdominal exam. pt has hx of chronic abd pain and medication noncompliance. Encouraged she stay hydrated and take insulin as prescribed. Follow up with her PCP/neurologist (pt reports "something came up" and she wasn't able to get to her neurology appointment after hospital discharge). Strict return precautions.   Discussed results, findings, treatment and follow up. Patient advised of return precautions. Patient verbalized understanding and agreed with plan.   Final Clinical Impressions(s) / ED Diagnoses   Final diagnoses:  Type 2 diabetes mellitus with hyperglycemia, with long-term current use of insulin (HCC)  Chronic abdominal pain  Non-intractable vomiting with nausea, unspecified vomiting type    ED Discharge Orders    None       ,  N, PA-C 08/25/18 1603    Delo, Douglas, MD 08/25/18 1909  

## 2018-09-03 ENCOUNTER — Other Ambulatory Visit: Payer: Self-pay

## 2018-09-03 ENCOUNTER — Emergency Department (HOSPITAL_COMMUNITY): Payer: Medicaid Other

## 2018-09-03 ENCOUNTER — Emergency Department (HOSPITAL_COMMUNITY)
Admission: EM | Admit: 2018-09-03 | Discharge: 2018-09-03 | Disposition: A | Discharge status: Home / Self Care | Payer: Medicaid Other | Attending: Emergency Medicine | Admitting: Emergency Medicine

## 2018-09-03 DIAGNOSIS — Z79899 Other long term (current) drug therapy: Secondary | ICD-10-CM | POA: Diagnosis not present

## 2018-09-03 DIAGNOSIS — R112 Nausea with vomiting, unspecified: Secondary | ICD-10-CM | POA: Insufficient documentation

## 2018-09-03 DIAGNOSIS — E860 Dehydration: Secondary | ICD-10-CM | POA: Insufficient documentation

## 2018-09-03 DIAGNOSIS — Z794 Long term (current) use of insulin: Secondary | ICD-10-CM | POA: Diagnosis not present

## 2018-09-03 DIAGNOSIS — E1065 Type 1 diabetes mellitus with hyperglycemia: Secondary | ICD-10-CM

## 2018-09-03 DIAGNOSIS — I509 Heart failure, unspecified: Secondary | ICD-10-CM | POA: Insufficient documentation

## 2018-09-03 DIAGNOSIS — R0789 Other chest pain: Secondary | ICD-10-CM

## 2018-09-03 DIAGNOSIS — I110 Hypertensive heart disease with heart failure: Secondary | ICD-10-CM | POA: Diagnosis not present

## 2018-09-03 DIAGNOSIS — E109 Type 1 diabetes mellitus without complications: Secondary | ICD-10-CM | POA: Insufficient documentation

## 2018-09-03 DIAGNOSIS — R079 Chest pain, unspecified: Secondary | ICD-10-CM | POA: Diagnosis present

## 2018-09-03 LAB — POCT I-STAT EG7
Acid-base deficit: 6 mmol/L — ABNORMAL HIGH (ref 0.0–2.0)
Bicarbonate: 18.6 mmol/L — ABNORMAL LOW (ref 20.0–28.0)
Calcium, Ion: 1.09 mmol/L — ABNORMAL LOW (ref 1.15–1.40)
HCT: 44 % (ref 36.0–46.0)
Hemoglobin: 15 g/dL (ref 12.0–15.0)
O2 Saturation: 81 %
Potassium: 4.8 mmol/L (ref 3.5–5.1)
Sodium: 132 mmol/L — ABNORMAL LOW (ref 135–145)
TCO2: 20 mmol/L — ABNORMAL LOW (ref 22–32)
pCO2, Ven: 33.4 mmHg — ABNORMAL LOW (ref 44.0–60.0)
pH, Ven: 7.355 (ref 7.250–7.430)
pO2, Ven: 46 mmHg — ABNORMAL HIGH (ref 32.0–45.0)

## 2018-09-03 LAB — CBC
HCT: 41.7 % (ref 36.0–46.0)
Hemoglobin: 13.4 g/dL (ref 12.0–15.0)
MCH: 28.5 pg (ref 26.0–34.0)
MCHC: 32.1 g/dL (ref 30.0–36.0)
MCV: 88.5 fL (ref 80.0–100.0)
Platelets: 392 10*3/uL (ref 150–400)
RBC: 4.71 MIL/uL (ref 3.87–5.11)
RDW: 12.1 % (ref 11.5–15.5)
WBC: 9.4 10*3/uL (ref 4.0–10.5)
nRBC: 0 % (ref 0.0–0.2)

## 2018-09-03 LAB — BASIC METABOLIC PANEL
Anion gap: 15 (ref 5–15)
BUN: 16 mg/dL (ref 6–20)
CO2: 17 mmol/L — ABNORMAL LOW (ref 22–32)
Calcium: 9 mg/dL (ref 8.9–10.3)
Chloride: 97 mmol/L — ABNORMAL LOW (ref 98–111)
Creatinine, Ser: 0.87 mg/dL (ref 0.44–1.00)
GFR calc Af Amer: >60 mL/min (ref >60)
GFR calc non Af Amer: >60 mL/min (ref >60)
Glucose, Bld: 716 mg/dL (ref 70–99)
Potassium: 5.1 mmol/L (ref 3.5–5.1)
Sodium: 129 mmol/L — ABNORMAL LOW (ref 135–145)

## 2018-09-03 LAB — CBG MONITORING, ED
Glucose-Capillary: 460 mg/dL — ABNORMAL HIGH (ref 70–99)
Glucose-Capillary: 325 mg/dL — ABNORMAL HIGH (ref 70–99)
Glucose-Capillary: 269 mg/dL — ABNORMAL HIGH (ref 70–99)

## 2018-09-03 LAB — MAGNESIUM: Magnesium: 2.2 mg/dL (ref 1.7–2.4)

## 2018-09-03 LAB — I-STAT BETA HCG BLOOD, ED (MC, WL, AP ONLY): I-stat hCG, quantitative: <5 m[IU]/mL (ref <5)

## 2018-09-03 LAB — TROPONIN I (HIGH SENSITIVITY)
Troponin I (High Sensitivity): 3 ng/L (ref <18)
Troponin I (High Sensitivity): 3 ng/L (ref <18)

## 2018-09-03 LAB — BETA-HYDROXYBUTYRIC ACID: Beta-Hydroxybutyric Acid: 0.24 mmol/L (ref 0.05–0.27)

## 2018-09-03 MED ORDER — FAMOTIDINE IN NACL 20-0.9 MG/50ML-% IV SOLN
20.0000 mg | Freq: Once | INTRAVENOUS | Status: AC
  Administered 2018-09-03: 20 mg via INTRAVENOUS
  Filled 2018-09-03: qty 50

## 2018-09-03 MED ORDER — SODIUM CHLORIDE 0.9% FLUSH
3.0000 mL | Freq: Once | INTRAVENOUS | Status: AC
  Administered 2018-09-03: 3 mL via INTRAVENOUS

## 2018-09-03 MED ORDER — LIDOCAINE VISCOUS HCL 2 % MT SOLN
15.0000 mL | Freq: Once | OROMUCOSAL | Status: AC
  Administered 2018-09-03: 17:00:00 15 mL via ORAL
  Filled 2018-09-03: qty 15

## 2018-09-03 MED ORDER — ALUM & MAG HYDROXIDE-SIMETH 200-200-20 MG/5ML PO SUSP
30.0000 mL | Freq: Once | ORAL | Status: AC
  Administered 2018-09-03: 30 mL via ORAL
  Filled 2018-09-03: qty 30

## 2018-09-03 MED ORDER — INSULIN ASPART 100 UNIT/ML ~~LOC~~ SOLN: 8.0000 [IU] | Freq: Once | SUBCUTANEOUS | Status: DC

## 2018-09-03 MED ORDER — HYDROMORPHONE HCL 1 MG/ML IJ SOLN
1.0000 mg | Freq: Once | INTRAMUSCULAR | Status: AC
  Administered 2018-09-03: 1 mg via INTRAVENOUS
  Filled 2018-09-03: qty 1

## 2018-09-03 MED ORDER — PROCHLORPERAZINE MALEATE 10 MG PO TABS: 10.0000 mg | ORAL_TABLET | Freq: Two times a day (BID) | ORAL | 0 refills | Status: DC | PRN

## 2018-09-03 MED ORDER — MORPHINE SULFATE (PF) 4 MG/ML IV SOLN
4.0000 mg | Freq: Once | INTRAVENOUS | Status: AC
  Administered 2018-09-03: 4 mg via INTRAVENOUS
  Filled 2018-09-03: qty 1

## 2018-09-03 MED ORDER — SODIUM CHLORIDE 0.9 % IV BOLUS
1000.0000 mL | Freq: Once | INTRAVENOUS | Status: AC
  Administered 2018-09-03: 17:00:00 1000 mL via INTRAVENOUS

## 2018-09-03 MED ORDER — ONDANSETRON HCL 4 MG/2ML IJ SOLN
4.0000 mg | Freq: Once | INTRAMUSCULAR | Status: AC
  Administered 2018-09-03: 4 mg via INTRAVENOUS
  Filled 2018-09-03: qty 2

## 2018-09-03 MED ORDER — SODIUM CHLORIDE 0.9 % IV BOLUS
1000.0000 mL | Freq: Once | INTRAVENOUS | Status: AC
  Administered 2018-09-03: 1000 mL via INTRAVENOUS

## 2018-09-03 MED ORDER — INSULIN ASPART 100 UNIT/ML ~~LOC~~ SOLN
10.0000 [IU] | Freq: Once | SUBCUTANEOUS | Status: AC
  Administered 2018-09-03: 17:00:00 10 [IU] via INTRAVENOUS

## 2018-09-03 NOTE — ED Triage Notes (Signed)
Patient arrives via [facility] from home c/o recurrent chest pain x 4 days, left-sided chest radiating to back, stabbing in nature, comes and goes. She endorses a history of the same, as well as seizures (reports having 4 unwitnessed ones today, but no post-ictal periods? no incontinence or tongue injury) and diabetes that are not well-controlled. Took full dose ASA prior to EMS arrival. A&O x 4.   EMS VS: 135/88, HR 120 ST, RR 16, 98% RA, CBG 485.

## 2018-09-03 NOTE — ED Provider Notes (Addendum)
[facility] Provider Note   CSN: [id number] Arrival date & time: 09/03/18  1433     History   Chief Complaint Chief Complaint  Patient presents with  . Chest Pain    HPI Nancy [name] is a 18 y.o. female.     Pt presents to the ED today with cp, n/v.  She has also been having seizures.  Cp and n/v have been going on for weeks.  The pt has been admitted several times for seizure eval.  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  The pt denies any postictal period or urinary or bowel incontinence.     Past Medical History:  Diagnosis Date  . Acanthosis nigricans   . Anxiety   . CHF (congestive heart failure) ([place])   . Chronic lower back pain   . Depression   . Dyspepsia   . Obesity   . Ovarian cyst    pt is not aware of this hx (11/24/2017)  . Pre-diabetes   . 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 Premature baby   . Seizures ([place])   . Type II diabetes mellitus ([facility])    insulin dependant    Patient Active Problem List   Diagnosis Date Noted  . Hypokalemia 08/04/2018  . Hypomagnesemia 08/04/2018  . Acute kidney injury ([place]) 08/04/2018  . Seizure ([place]) 08/03/2018  . Depression with anxiety 07/25/2018  . DKA (diabetic ketoacidoses) ([place]) 07/25/2018  . Uncontrolled seizures ([place]) 06/14/2018  . Syncope 01/30/2018  . Orthostatic hypotension 01/24/2018  . DKA (diabetic ketoacidosis) ([place]) 01/24/2018  . Chronic abdominal pain 12/24/2017  . Sinus tachycardia by electrocardiogram   . Acute lower UTI   . Uncontrolled type 2 diabetes mellitus with hyperglycemia ([place])   . Chest pain 12/19/2017  . Generalized abdominal pain 08/21/2017  . Non compliance with medical treatment 01/27/2012  . Adjustment disorder 09/16/2011  . Diabetes type 2, uncontrolled ([place]) 09/15/2011  . Dyspepsia   . Acanthosis nigricans   . Goiter   . Obesity 06/14/2010  . Hypertension 06/14/2010    Past Surgical History:   Procedure Laterality Date  . ABDOMINAL HERNIA REPAIR     "I was a baby"  . HERNIA REPAIR    . TONSILLECTOMY AND ADENOIDECTOMY    . WISDOM TOOTH EXTRACTION  2017     OB History   No obstetric history on file.      Home Medications    Prior to Admission medications   Medication Sig Start Date End Date Taking? Authorizing Provider  diclofenac sodium (VOLTAREN) 1 % GEL Apply 2 g topically 3 (three) times daily as needed (pain).    Yes [provider]  FLUoxetine (PROZAC) 20 MG capsule Take 1 capsule (20 mg total) by mouth at bedtime. Please allow patient to pick up 30 day supply for free 07/13/18 09/03/18 Yes [name], NP  insulin glargine (LANTUS) 100 UNIT/ML injection Inject 0.2 mLs (20 Units total) into the skin 2 (two) times daily. Patient taking differently: Inject 60 Units into the skin at bedtime.  07/26/18  Yes Mercy Riding, MD  meloxicam (MOBIC) 7.5 MG tablet Take 7.5 mg by mouth at bedtime as needed for pain.    Yes [provider]  promethazine (PHENERGAN) 25 MG tablet Take 1 tablet (25 mg total) by mouth every 8 (eight) hours as needed for nausea or vomiting. 07/29/18  Yes Ripley Fraise, MD  propranolol (INDERAL) 20 MG tablet  Take 1 tablet (20 mg total) by mouth 2 (two) times daily. Please allow patient to pick up 30 day supply for free 07/13/18 10/06/18 Yes [name], NP  topiramate (TOPAMAX) 25 MG tablet Take 1 tablet (25 mg total) by mouth 2 (two) times daily. Please allow patient to pick up 30 day supply for free 07/13/18 10/06/18 Yes [name], NP  atorvastatin (LIPITOR) 20 MG tablet Take 1 tablet (20 mg total) by mouth daily. 07/26/18 10/24/18  Mercy Riding, MD  Blood Glucose Monitoring Suppl (TRUE METRIX METER) w/Device KIT Use as instructed. Monitor blood glucose levels twice per day 12/02/17   [name], NP  gabapentin (NEURONTIN) 300 MG capsule Take 1 capsule (300 mg total) by mouth at bedtime for 30 days. 06/02/18 07/05/27   [name], NP  glucagon 1 MG injection Use for Severe Hypoglycemia . Inject 1 mg intramuscularly if unresponsive, unable to swallow, unconscious and/or has seizure Patient taking differently: Inject 1 mg into the muscle once as needed (for severe hypoglycemia- if unresponsive, unable to swallow, unconscious, and/or has had seizure(s)).  07/20/13 12/19/25  Renato Shin, MD  glucose blood (TRUE METRIX BLOOD GLUCOSE TEST) test strip Use as instructed 02/15/18   [name], NP  insulin lispro (HUMALOG) 100 UNIT/ML injection Inject 0-0.15 mLs (0-15 Units total) into the skin 4 (four) times daily -  with meals and at bedtime. Patient not taking: Reported on 09/03/2018 07/26/18 08/25/18  Mercy Riding, MD  Insulin Pen Needle (B-D UF III MINI PEN NEEDLES) 31G X 5 MM MISC Use as instructed. Monitor blood glucose levels twice per day 02/15/18   [name], NP  lamoTRIgine (LAMICTAL) 25 MG tablet Take 1 tablet (25 mg total) by mouth 2 (two) times daily. 08/08/18   [place] Donnamarie Poag, DO  prochlorperazine (COMPAZINE) 10 MG tablet Take 1 tablet (10 mg total) by mouth 2 (two) times daily as needed for nausea or vomiting. 09/03/18   Isla Pence, MD  QUEtiapine (SEROQUEL) 100 MG tablet Take 400 mg by mouth at bedtime.     [provider]  TRUEPLUS LANCETS 28G MISC Use as instructed. Monitor blood glucose levels twice per day 12/02/17   [name], NP    Family History Family History  Problem Relation Age of Onset  . Diabetes Mother   . Hypertension Mother   . Obesity Mother   . Asthma Mother   . Allergic rhinitis Mother   . Eczema Mother   . Diabetes Father   . Hypertension Father   . Obesity Father   . Hyperlipidemia Father   . Hypertension Paternal Aunt   . Hypertension Maternal Grandfather   . Colon cancer Maternal Grandfather   . Diabetes Paternal Grandmother   . Obesity Paternal Grandmother   . Diabetes Paternal Grandfather   . Obesity Paternal Grandfather   .  Angioedema Neg Hx   . Immunodeficiency Neg Hx   . Urticaria Neg Hx   . Stomach cancer Neg Hx   . Esophageal cancer Neg Hx     Social History Social History   Tobacco Use  . Smoking status: Never Smoker  . Smokeless tobacco: Never Used  Substance Use Topics  . Alcohol use: No    Alcohol/week: 0.0 standard drinks  . Drug use: No     Allergies   Ibuprofen   Review of Systems Review of Systems  Cardiovascular: Positive for chest pain.  Gastrointestinal: Positive for nausea and vomiting.  Neurological: Positive for seizures.  All other systems reviewed and are negative.    Physical Exam Updated Vital Signs BP 112/76   Pulse (!) 116   Temp 99.8 F (37.7 C) (Oral)   Resp (!) 24   LMP 07/31/2018 (Exact Date)   SpO2 100%   Physical Exam Vitals signs and nursing note reviewed.  Constitutional:      Appearance: She is well-developed.  HENT:     Head: Normocephalic and atraumatic.  Eyes:     Extraocular Movements: Extraocular movements intact.     Pupils: Pupils are equal, round, and reactive to light.  Neck:     Musculoskeletal: Neck supple.  Cardiovascular:     Rate and Rhythm: Regular rhythm. Tachycardia present.     Heart sounds: Normal heart sounds.  Pulmonary:     Effort: Pulmonary effort is normal.     Breath sounds: Normal breath sounds.  Abdominal:     General: Bowel sounds are normal.     Palpations: Abdomen is soft.  Musculoskeletal: Normal range of motion.  Skin:    General: Skin is warm.     Capillary Refill: Capillary refill takes less than 2 seconds.  Neurological:     General: No focal deficit present.     Mental Status: She is alert and oriented to person, place, and time.  Psychiatric:        Mood and Affect: Mood normal.        Behavior: Behavior normal.      ED Treatments / Results  Labs (all labs ordered are listed, but only abnormal results are displayed) Labs Reviewed  BASIC METABOLIC PANEL - Abnormal; Notable for the  following components:      Result Value   Sodium 129 (*)    Chloride 97 (*)    CO2 17 (*)    Glucose, Bld 716 (*)    All other components within normal limits  POCT I-STAT EG7 - Abnormal; Notable for the following components:   pCO2, Ven 33.4 (*)    pO2, Ven 46.0 (*)    Bicarbonate 18.6 (*)    TCO2 20 (*)    Acid-base deficit 6.0 (*)    Sodium 132 (*)    Calcium, Ion 1.09 (*)    All other components within normal limits  CBG MONITORING, ED - Abnormal; Notable for the following components:   Glucose-Capillary 460 (*)    All other components within normal limits  CBG MONITORING, ED - Abnormal; Notable for the following components:   Glucose-Capillary 325 (*)    All other components within normal limits  CBG MONITORING, ED - Abnormal; Notable for the following components:   Glucose-Capillary 269 (*)    All other components within normal limits  CBC  MAGNESIUM  BETA-HYDROXYBUTYRIC ACID  URINALYSIS, ROUTINE W REFLEX MICROSCOPIC  I-STAT BETA HCG BLOOD, ED ([facility], [facility], [facility] ONLY)  I-STAT VENOUS BLOOD GAS, ED  CBG MONITORING, ED  TROPONIN I (HIGH SENSITIVITY)  TROPONIN I (HIGH SENSITIVITY)    EKG EKG Interpretation  Date/Time:  Friday September 03 2018 14:42:20 EDT Ventricular Rate:  136 PR Interval:  144 QRS Duration: 90 QT Interval:  292 QTC Calculation: 439 R Axis:   70 Text Interpretation:  Sinus tachycardia with occasional Premature ventricular complexes Otherwise normal ECG No significant change since last tracing Confirmed by Isla Pence [phone]) on 09/03/2018 3:16:31 PM   Radiology Dg Chest 2 View  Result Date: 09/03/2018 CLINICAL DATA:  18 year old female with left upper chest pain  radiating to the back for 1 week. EXAM: CHEST - 2 VIEW COMPARISON:  Chest radiographs 07/19/2018 and earlier. FINDINGS: AP and lateral views. Mildly low lung volumes. Mediastinal contours remain normal. Visualized tracheal air column is within normal limits. Both lungs appear clear. No  pneumothorax or pleural effusion. No acute osseous abnormality identified. Negative visible bowel gas pattern. IMPRESSION: Negative.  No cardiopulmonary abnormality. Electronically Signed   By: Genevie Ann M.D.   On: 09/03/2018 17:14    Procedures Procedures (including critical care time)  Medications Ordered in ED Medications  sodium chloride flush (NS) 0.9 % injection 3 mL (3 mLs Intravenous Given 09/03/18 1533)  sodium chloride 0.9 % bolus 1,000 mL (0 mLs Intravenous Stopped 09/03/18 1717)  ondansetron (ZOFRAN) injection 4 mg (4 mg Intravenous Given 09/03/18 1533)  famotidine (PEPCID) IVPB 20 mg premix (0 mg Intravenous Stopped 09/03/18 1617)  morphine 4 MG/ML injection 4 mg (4 mg Intravenous Given 09/03/18 1617)  insulin aspart (novoLOG) injection 10 Units (10 Units Intravenous Given 09/03/18 1723)  sodium chloride 0.9 % bolus 1,000 mL (0 mLs Intravenous Stopped 09/03/18 1936)  alum & mag hydroxide-simeth (MAALOX/MYLANTA) 200-200-20 MG/5ML suspension 30 mL (30 mLs Oral Given 09/03/18 1724)    And  lidocaine (XYLOCAINE) 2 % viscous mouth solution 15 mL (15 mLs Oral Given 09/03/18 1724)  HYDROmorphone (DILAUDID) injection 1 mg (1 mg Intravenous Given 09/03/18 1826)     Initial Impression / Assessment and Plan / ED Course  I have reviewed the triage vital signs and the nursing notes.  Pertinent labs & imaging results that were available during my care of the patient were reviewed by me and considered in my medical decision making (see chart for details).    CRITICAL CARE Performed by: Isla Pence   Total critical care time: 30 minutes  Critical care time was exclusive of separately billable procedures and treating other patients.  Critical care was necessary to treat or prevent imminent or life-threatening deterioration.  Critical care was time spent personally by me on the following activities: development of treatment plan with patient and/or surrogate as well as nursing, discussions  with consultants, evaluation of patient's response to treatment, examination of patient, obtaining history from patient or surrogate, ordering and performing treatments and interventions, ordering and review of laboratory studies, ordering and review of radiographic studies, pulse oximetry and re-evaluation of patient's condition.   Blood sugar is extremely high (716), but pt is not in DKA.  The pt is given 10 units of insulin IV and 2L of IVFs.  Pt's BS is down to 269.  CP is chronic and likely due to chronic vomiting.  Pt likely has diabetic gastroparesis.  She is able to keep down diet Ginger Ale and is stable for d/c home.  She said zofran, phenergan, and reglan don't help her nausea, so I will try compazine.  She is instructed to f/u with her pcp and with her GI dr.  Return if worse.  Final Clinical Impressions(s) / ED Diagnoses   Final diagnoses:  Atypical chest pain  Non-intractable vomiting with nausea, unspecified vomiting type  Poorly controlled type 1 diabetes mellitus ([place])  Dehydration    ED Discharge Orders         Ordered    prochlorperazine (COMPAZINE) 10 MG tablet  2 times daily PRN     09/03/18 1933           Isla Pence, MD 09/03/18 Holli Humbles    Isla Pence, MD 09/17/18 [phone]

## 2018-09-06 ENCOUNTER — Telehealth: Payer: Self-pay

## 2018-09-06 ENCOUNTER — Other Ambulatory Visit: Payer: Self-pay

## 2018-09-06 ENCOUNTER — Emergency Department (HOSPITAL_COMMUNITY): Payer: Medicaid Other

## 2018-09-06 ENCOUNTER — Encounter (HOSPITAL_COMMUNITY): Payer: Self-pay

## 2018-09-06 ENCOUNTER — Emergency Department (HOSPITAL_COMMUNITY)
Admission: EM | Admit: 2018-09-06 | Discharge: 2018-09-06 | Disposition: A | Discharge status: Home / Self Care | Payer: Medicaid Other | Attending: Emergency Medicine | Admitting: Emergency Medicine

## 2018-09-06 DIAGNOSIS — R1011 Right upper quadrant pain: Secondary | ICD-10-CM | POA: Diagnosis not present

## 2018-09-06 DIAGNOSIS — E119 Type 2 diabetes mellitus without complications: Secondary | ICD-10-CM | POA: Insufficient documentation

## 2018-09-06 DIAGNOSIS — Z79899 Other long term (current) drug therapy: Secondary | ICD-10-CM | POA: Diagnosis not present

## 2018-09-06 DIAGNOSIS — Z794 Long term (current) use of insulin: Secondary | ICD-10-CM | POA: Diagnosis not present

## 2018-09-06 DIAGNOSIS — G8929 Other chronic pain: Secondary | ICD-10-CM | POA: Diagnosis not present

## 2018-09-06 DIAGNOSIS — R569 Unspecified convulsions: Secondary | ICD-10-CM | POA: Diagnosis present

## 2018-09-06 LAB — CBC WITH DIFFERENTIAL/PLATELET
Abs Immature Granulocytes: 0.03 10*3/uL (ref 0.00–0.07)
Basophils Absolute: 0.1 10*3/uL (ref 0.0–0.1)
Basophils Relative: 1 %
Eosinophils Absolute: 0.1 10*3/uL (ref 0.0–0.5)
Eosinophils Relative: 1 %
HCT: 42.8 % (ref 36.0–46.0)
Hemoglobin: 13.7 g/dL (ref 12.0–15.0)
Immature Granulocytes: 0 %
Lymphocytes Relative: 30 %
Lymphs Abs: 2.8 10*3/uL (ref 0.7–4.0)
MCH: 28.4 pg (ref 26.0–34.0)
MCHC: 32 g/dL (ref 30.0–36.0)
MCV: 88.6 fL (ref 80.0–100.0)
Monocytes Absolute: 0.9 10*3/uL (ref 0.1–1.0)
Monocytes Relative: 10 %
Neutro Abs: 5.2 10*3/uL (ref 1.7–7.7)
Neutrophils Relative %: 58 %
Platelets: 406 10*3/uL — ABNORMAL HIGH (ref 150–400)
RBC: 4.83 MIL/uL (ref 3.87–5.11)
RDW: 12.5 % (ref 11.5–15.5)
WBC: 9.1 10*3/uL (ref 4.0–10.5)
nRBC: 0 % (ref 0.0–0.2)

## 2018-09-06 LAB — COMPREHENSIVE METABOLIC PANEL
ALT: 18 U/L (ref 0–44)
AST: 17 U/L (ref 15–41)
Albumin: 3.8 g/dL (ref 3.5–5.0)
Alkaline Phosphatase: 90 U/L (ref 38–126)
Anion gap: 13 (ref 5–15)
BUN: 12 mg/dL (ref 6–20)
CO2: 26 mmol/L (ref 22–32)
Calcium: 9.1 mg/dL (ref 8.9–10.3)
Chloride: 96 mmol/L — ABNORMAL LOW (ref 98–111)
Creatinine, Ser: 0.51 mg/dL (ref 0.44–1.00)
GFR calc Af Amer: >60 mL/min (ref >60)
GFR calc non Af Amer: >60 mL/min (ref >60)
Glucose, Bld: 354 mg/dL — ABNORMAL HIGH (ref 70–99)
Potassium: 3.9 mmol/L (ref 3.5–5.1)
Sodium: 135 mmol/L (ref 135–145)
Total Bilirubin: 0.2 mg/dL — ABNORMAL LOW (ref 0.3–1.2)
Total Protein: 6.9 g/dL (ref 6.5–8.1)

## 2018-09-06 LAB — CBG MONITORING, ED: Glucose-Capillary: 370 mg/dL — ABNORMAL HIGH (ref 70–99)

## 2018-09-06 LAB — URINALYSIS, ROUTINE W REFLEX MICROSCOPIC
Bilirubin Urine: NEGATIVE
Glucose, UA: >=500 mg/dL — AB
Hgb urine dipstick: NEGATIVE
Ketones, ur: NEGATIVE mg/dL
Leukocytes,Ua: NEGATIVE
Nitrite: NEGATIVE
Protein, ur: NEGATIVE mg/dL
Specific Gravity, Urine: 1.039 — ABNORMAL HIGH (ref 1.005–1.030)
pH: 5 (ref 5.0–8.0)

## 2018-09-06 LAB — LIPASE, BLOOD: Lipase: 26 U/L (ref 11–51)

## 2018-09-06 LAB — I-STAT BETA HCG BLOOD, ED (MC, WL, AP ONLY): I-stat hCG, quantitative: <5 m[IU]/mL (ref <5)

## 2018-09-06 MED ORDER — LIDOCAINE VISCOUS HCL 2 % MT SOLN
15.0000 mL | Freq: Once | OROMUCOSAL | Status: AC
  Administered 2018-09-06: 18:00:00 15 mL via ORAL
  Filled 2018-09-06: qty 15

## 2018-09-06 MED ORDER — SUCRALFATE 1 G PO TABS
1.0000 g | ORAL_TABLET | Freq: Once | ORAL | Status: AC
  Administered 2018-09-06: 1 g via ORAL
  Filled 2018-09-06: qty 1

## 2018-09-06 MED ORDER — ALUM & MAG HYDROXIDE-SIMETH 200-200-20 MG/5ML PO SUSP
30.0000 mL | Freq: Once | ORAL | Status: AC
  Administered 2018-09-06: 18:00:00 30 mL via ORAL
  Filled 2018-09-06: qty 30

## 2018-09-06 MED ORDER — SODIUM CHLORIDE 0.9 % IV BOLUS
1000.0000 mL | Freq: Once | INTRAVENOUS | Status: AC
  Administered 2018-09-06: 17:00:00 1000 mL via INTRAVENOUS

## 2018-09-06 MED ORDER — HYDROMORPHONE HCL 1 MG/ML IJ SOLN
0.5000 mg | Freq: Once | INTRAMUSCULAR | Status: AC
  Administered 2018-09-06: 20:00:00 0.5 mg via INTRAVENOUS
  Filled 2018-09-06: qty 1

## 2018-09-06 MED FILL — PROCHLORPERAZINE 10 MG TAB: 10 | 10 days supply | Qty: 20 | Fill #0

## 2018-09-06 NOTE — ED Notes (Addendum)
Pt given sandwich and water. No reports of nausea or difficulty swallowing

## 2018-09-06 NOTE — ED Provider Notes (Signed)
[facility] DEPT Provider Note   CSN: [id number] Arrival date & time: 09/06/18  1508     History   Chief Complaint Chief Complaint  Patient presents with   Seizures    HPI Nancy [name] is a 18 y.o. female.     HPI Patient states that for the past week she has had multiple seizures daily.  States they were witnessed by her mother and describes them as shaking.  Sometimes she is confused afterwards.  She thinks she may have bit her tongue.  Seizures typically last around 5 minutes.  States she has been compliant with her Lamictal.  Denies headache, visual changes, focal weakness or numbness. Patient also complains of upper abdominal pain and multiple episodes of vomiting for the last few days.  States she has noticed a small amount of blood in the vomit.  She had regular bowel movements with no grossly bloody or melanotic stools.  She has no fever or chills. 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[place])    Premature baby    Seizures ([place])    Type II diabetes mellitus ([place])    insulin dependant    Patient Active Problem List   Diagnosis Date Noted   Hypokalemia 08/04/2018   Hypomagnesemia 08/04/2018   Acute kidney injury ([place]) 08/04/2018   Seizure ([place]) 08/03/2018   Depression with anxiety 07/25/2018   DKA (diabetic ketoacidoses) ([place]) 07/25/2018   Uncontrolled seizures ([place]) 06/14/2018   Syncope 01/30/2018   Orthostatic hypotension 01/24/2018   DKA (diabetic ketoacidosis) ([place]) 01/24/2018   Chronic abdominal pain 12/24/2017   Sinus tachycardia by electrocardiogram    Acute lower UTI    Uncontrolled type 2 diabetes mellitus with hyperglycemia ([place])    Chest pain  12/19/2017   Generalized abdominal pain 08/21/2017   Non compliance with medical treatment 01/27/2012   Adjustment disorder 09/16/2011   Diabetes type 2, uncontrolled ([place]) 09/15/2011   Dyspepsia    Acanthosis nigricans    Goiter    Obesity 06/14/2010   Hypertension 06/14/2010    Past Surgical History:  Procedure Laterality Date   ABDOMINAL HERNIA REPAIR     "I was a baby"   [place] EXTRACTION  2017     OB History   No obstetric history on file.      Home Medications    Prior to Admission medications   Medication Sig Start Date End Date Taking? Authorizing Provider  atorvastatin (LIPITOR) 20 MG tablet Take 1 tablet (20 mg total) by mouth daily. 07/26/18 10/24/18 Yes Mercy Riding, MD  diclofenac sodium (VOLTAREN) 1 % GEL Apply 2 g topically 3 (three) times daily as needed (pain).    Yes [provider]  FLUoxetine (PROZAC) 20 MG capsule Take 1 capsule (20 mg total) by mouth at bedtime. Please allow patient to pick up 30 day supply for free 07/13/18 09/06/18 Yes [name], NP  gabapentin (NEURONTIN) 300 MG capsule Take 1 capsule (300 mg total) by mouth at bedtime for 30 days. 06/02/18 07/05/27 Yes [name], NP  glucagon 1  MG injection Use for Severe Hypoglycemia . Inject 1 mg intramuscularly if unresponsive, unable to swallow, unconscious and/or has seizure Patient taking differently: Inject 1 mg into the muscle once as needed (for severe hypoglycemia- if unresponsive, unable to swallow, unconscious, and/or has had seizure(s)).  07/20/13 12/19/25 Yes Renato Shin, MD  insulin glargine (LANTUS) 100 UNIT/ML injection Inject 0.2 mLs (20 Units total) into the skin 2 (two) times daily. Patient taking differently: Inject 60 Units into the skin at bedtime.  07/26/18  Yes Mercy Riding, MD  lamoTRIgine (LAMICTAL) 25 MG tablet Take 1 tablet (25 mg total) by mouth 2 (two) times daily. 08/08/18  Yes  [place] Eric J, DO  meloxicam (MOBIC) 7.5 MG tablet Take 7.5 mg by mouth at bedtime as needed for pain.    Yes [provider]  propranolol (INDERAL) 20 MG tablet Take 1 tablet (20 mg total) by mouth 2 (two) times daily. Please allow patient to pick up 30 day supply for free 07/13/18 10/06/18 Yes [name], NP  QUEtiapine (SEROQUEL) 100 MG tablet Take 400 mg by mouth at bedtime.    Yes [provider]  topiramate (TOPAMAX) 25 MG tablet Take 1 tablet (25 mg total) by mouth 2 (two) times daily. Please allow patient to pick up 30 day supply for free 07/13/18 10/06/18 Yes [name], NP  trazodone (DESYREL) 300 MG tablet Take 300 mg by mouth at bedtime.   Yes [provider]  Blood Glucose Monitoring Suppl (TRUE METRIX METER) w/Device KIT Use as instructed. Monitor blood glucose levels twice per day 12/02/17   [name], NP  glucose blood (TRUE METRIX BLOOD GLUCOSE TEST) test strip Use as instructed 02/15/18   [name], NP  insulin lispro (HUMALOG) 100 UNIT/ML injection Inject 0-0.15 mLs (0-15 Units total) into the skin 4 (four) times daily -  with meals and at bedtime. Patient not taking: Reported on 09/03/2018 07/26/18 08/25/18  Mercy Riding, MD  Insulin Pen Needle (B-D UF III MINI PEN NEEDLES) 31G X 5 MM MISC Use as instructed. Monitor blood glucose levels twice per day 02/15/18   [name], NP  prochlorperazine (COMPAZINE) 10 MG tablet Take 1 tablet (10 mg total) by mouth 2 (two) times daily as needed for nausea or vomiting. 09/03/18   Isla Pence, MD  promethazine (PHENERGAN) 25 MG tablet Take 1 tablet (25 mg total) by mouth every 8 (eight) hours as needed for nausea or vomiting. Patient not taking: Reported on 09/06/2018 07/29/18   Ripley Fraise, MD  TRUEPLUS LANCETS 28G MISC Use as instructed. Monitor blood glucose levels twice per day 12/02/17   [name], NP    Family History Family History  Problem Relation Age of Onset    Diabetes Mother    Hypertension Mother    Obesity Mother    Asthma Mother    Allergic rhinitis Mother    Eczema Mother    Diabetes Father    Hypertension Father    Obesity Father    Hyperlipidemia Father    Hypertension Paternal Aunt    Hypertension Maternal Grandfather    Colon cancer Maternal Grandfather    Diabetes Paternal Grandmother    Obesity Paternal Grandmother    Diabetes Paternal Grandfather    Obesity Paternal Grandfather    Angioedema Neg Hx    Immunodeficiency Neg Hx    Urticaria Neg Hx    Stomach cancer Neg Hx    Esophageal cancer Neg Hx  Social History Social History   Tobacco Use   Smoking status: Never Smoker   Smokeless tobacco: Never Used  Substance Use Topics   Alcohol use: No    Alcohol/week: 0.0 standard drinks   Drug use: No     Allergies   Ibuprofen   Review of Systems Review of Systems  Constitutional: Negative for chills and fever.  HENT: Negative for sore throat and trouble swallowing.   Eyes: Negative for visual disturbance.  Respiratory: Negative for cough and shortness of breath.   Cardiovascular: Negative for chest pain.  Gastrointestinal: Positive for abdominal pain, nausea and vomiting. Negative for blood in stool, constipation and diarrhea.  Genitourinary: Negative for dysuria and flank pain.  Musculoskeletal: Negative for back pain, myalgias, neck pain and neck stiffness.  Skin: Negative for rash and wound.  Neurological: Positive for seizures. Negative for dizziness, weakness, light-headedness, numbness and headaches.  All other systems reviewed and are negative.    Physical Exam Updated Vital Signs BP 120/88    Pulse 99    Temp 98 F (36.7 C) (Oral)    Resp 17    Ht 5' 3"  (1.6 m)    Wt 83.9 kg    LMP 08/30/2018    SpO2 100%    BMI 32.77 kg/m   Physical Exam Vitals signs and nursing note reviewed.  Constitutional:      Appearance: Normal appearance. She is well-developed.  HENT:      Head: Normocephalic and atraumatic.     Comments: No obvious head injury.  No intraoral trauma.    Nose: Nose normal.     Mouth/Throat:     Mouth: Mucous membranes are moist.  Eyes:     Extraocular Movements: Extraocular movements intact.     Pupils: Pupils are equal, round, and reactive to light.  Neck:     Musculoskeletal: Normal range of motion and neck supple. No neck rigidity or muscular tenderness.     Comments: No meningismus.  No posterior midline cervical tenderness to palpation. Cardiovascular:     Rate and Rhythm: Regular rhythm. Tachycardia present.     Heart sounds: No murmur. No friction rub. No gallop.   Pulmonary:     Effort: Pulmonary effort is normal. No respiratory distress.     Breath sounds: Normal breath sounds. No stridor. No wheezing, rhonchi or rales.  Chest:     Chest wall: No tenderness.  Abdominal:     General: Bowel sounds are normal. There is no distension.     Palpations: Abdomen is soft. There is no mass.     Tenderness: There is abdominal tenderness. There is no right CVA tenderness, left CVA tenderness, guarding or rebound.     Hernia: No hernia is present.     Comments: Mild epigastric tenderness to palpation.  No rebound or guarding.  Musculoskeletal: Normal range of motion.        General: No swelling, tenderness, deformity or signs of injury.     Right lower leg: No edema.     Left lower leg: No edema.     Comments: No midline thoracic or lumbar tenderness.  No CVA tenderness.  Lymphadenopathy:     Cervical: No cervical adenopathy.  Skin:    General: Skin is warm and dry.     Capillary Refill: Capillary refill takes less than 2 seconds.     Findings: No erythema or rash.  Neurological:     General: No focal deficit present.     Mental Status: She  is alert and oriented to person, place, and time.  Psychiatric:        Mood and Affect: Mood normal.        Behavior: Behavior normal.      ED Treatments / Results  Labs (all labs ordered  are listed, but only abnormal results are displayed) Labs Reviewed  CBC WITH DIFFERENTIAL/PLATELET - Abnormal; Notable for the following components:      Result Value   Platelets 406 (*)    All other components within normal limits  URINALYSIS, ROUTINE W REFLEX MICROSCOPIC - Abnormal; Notable for the following components:   APPearance CLOUDY (*)    Specific Gravity, Urine 1.039 (*)    Glucose, UA >=500 (*)    Bacteria, UA RARE (*)    All other components within normal limits  COMPREHENSIVE METABOLIC PANEL - Abnormal; Notable for the following components:   Chloride 96 (*)    Glucose, Bld 354 (*)    Total Bilirubin 0.2 (*)    All other components within normal limits  CBG MONITORING, ED - Abnormal; Notable for the following components:   Glucose-Capillary 370 (*)    All other components within normal limits  LIPASE, BLOOD  I-STAT BETA HCG BLOOD, ED ([facility], [facility], [facility] ONLY)    EKG EKG Interpretation  Date/Time:  Monday September 06 2018 15:33:15 EDT Ventricular Rate:  121 PR Interval:    QRS Duration: 88 QT Interval:  312 QTC Calculation: 443 R Axis:   55 Text Interpretation:  Sinus tachycardia Low voltage, extremity leads ST elev, probable normal early repol pattern Confirmed by Julianne Rice [phone]) on 09/06/2018 4:38:54 PM   Radiology US Abdomen Limited  Result Date: 09/06/2018 CLINICAL DATA:  Right upper quadrant and mid abdominal pain, nonacute. History of hernia surgery as a child. EXAM: ULTRASOUND ABDOMEN LIMITED RIGHT UPPER QUADRANT COMPARISON:  Ultrasound 01/26/2018. FINDINGS: Gallbladder: The gallbladder is incompletely distended. No gallstones or wall thickening visualized. No sonographic Murphy sign noted by sonographer. Common bile duct: Diameter: 4 mm Liver: The appendix echogenicity appears mildly increased. No focal abnormality identified. Portal vein is patent on color Doppler imaging with normal direction of blood flow towards the liver. Other: None. IMPRESSION: No  acute findings or evidence of biliary disease. Mildly increased hepatic echogenicity without focal abnormality. Electronically Signed   By: Richardean Sale M.D.   On: 09/06/2018 17:19    Procedures Procedures (including critical care time)  Medications Ordered in ED Medications  sodium chloride 0.9 % bolus 1,000 mL (0 mLs Intravenous Stopped 09/06/18 1905)  alum & mag hydroxide-simeth (MAALOX/MYLANTA) 200-200-20 MG/5ML suspension 30 mL (30 mLs Oral Given 09/06/18 1827)    And  lidocaine (XYLOCAINE) 2 % viscous mouth solution 15 mL (15 mLs Oral Given 09/06/18 1828)  sucralfate (CARAFATE) tablet 1 g (1 g Oral Given 09/06/18 1849)  HYDROmorphone (DILAUDID) injection 0.5 mg (0.5 mg Intravenous Given 09/06/18 2029)     Initial Impression / Assessment and Plan / ED Course  I have reviewed the triage vital signs and the nursing notes.  Pertinent labs & imaging results that were available during my care of the patient were reviewed by me and considered in my medical decision making (see chart for details).        Reviewed notes from patient's recent admission.  Had EEG during seizure-like activity that showed no epileptic activity.  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  Have recommended cognitive behavioral therapy.  Laboratory work-up without concerning findings.  Ultrasound without acute findings.  Patient with chronic abdominal pain, nausea and vomiting.  Given IV fluids, GI cocktail, Carafate and 1 dose of IV Dilaudid.  Patient is tolerating oral intake.  She continues to have abdominal pain but will follow-up with her gastroenterologist.  Strict return precautions have been given.  Final Clinical Impressions(s) / ED Diagnoses   Final diagnoses:  Seizure-like activity ([place])  Chronic abdominal pain    ED Discharge Orders    None       Julianne Rice, MD 09/06/18 2117

## 2018-09-06 NOTE — ED Notes (Signed)
Pt stated she was having chest pain. Repeat EKG given to [facility], MD. Pt is not diaphoretic or short of breath. Will continue to monitor.

## 2018-09-06 NOTE — ED Notes (Signed)
Pt ambulated with steady gait

## 2018-09-06 NOTE — ED Notes (Signed)
Pt states no relief with GI cocktail. 

## 2018-09-06 NOTE — ED Notes (Signed)
Pt ambulated to the bathroom without assistance. Gait steady. No shortness of breath.

## 2018-09-06 NOTE — ED Notes (Signed)
Pt verbalized discharge instructions and follow up care. Alert and ambulatory. No IV. No further questions at this time. Has ride home.

## 2018-09-06 NOTE — ED Triage Notes (Signed)
Per EMS- Patient was found lying prone on her floor at her home. Patient called 911 herself and stated she had a seizure. EMS reports that the patient was not postical and vital signs  were normal, no incontinence noted.

## 2018-09-06 NOTE — ED Notes (Signed)
US at bedside

## 2018-09-06 NOTE — Telephone Encounter (Signed)
Patient scheduled for office visit with Nicoletta Ba PA on 09/17/18 @ 2:00pm. F/U from ED visit on 09/03/18 per Dr. Doyne Keel request.( Dr. Havery Moros booked through Sept.). patient aware of appt. And good with it.

## 2018-09-06 NOTE — ED Notes (Signed)
Pt requesting something to drink and pain meds . MD made aware

## 2018-09-10 ENCOUNTER — Other Ambulatory Visit: Payer: Self-pay

## 2018-09-10 ENCOUNTER — Emergency Department (HOSPITAL_COMMUNITY): Payer: Medicaid Other

## 2018-09-10 ENCOUNTER — Emergency Department (HOSPITAL_COMMUNITY)
Admission: EM | Admit: 2018-09-10 | Discharge: 2018-09-10 | Disposition: A | Discharge status: Home / Self Care | Payer: Medicaid Other | Attending: Emergency Medicine | Admitting: Emergency Medicine

## 2018-09-10 DIAGNOSIS — G8929 Other chronic pain: Secondary | ICD-10-CM

## 2018-09-10 DIAGNOSIS — Z794 Long term (current) use of insulin: Secondary | ICD-10-CM | POA: Insufficient documentation

## 2018-09-10 DIAGNOSIS — R109 Unspecified abdominal pain: Secondary | ICD-10-CM | POA: Diagnosis not present

## 2018-09-10 DIAGNOSIS — R569 Unspecified convulsions: Secondary | ICD-10-CM | POA: Diagnosis not present

## 2018-09-10 DIAGNOSIS — I110 Hypertensive heart disease with heart failure: Secondary | ICD-10-CM | POA: Insufficient documentation

## 2018-09-10 DIAGNOSIS — Z79899 Other long term (current) drug therapy: Secondary | ICD-10-CM | POA: Diagnosis not present

## 2018-09-10 DIAGNOSIS — I509 Heart failure, unspecified: Secondary | ICD-10-CM | POA: Diagnosis not present

## 2018-09-10 DIAGNOSIS — R112 Nausea with vomiting, unspecified: Secondary | ICD-10-CM | POA: Diagnosis present

## 2018-09-10 DIAGNOSIS — E119 Type 2 diabetes mellitus without complications: Secondary | ICD-10-CM | POA: Diagnosis not present

## 2018-09-10 LAB — CBC WITH DIFFERENTIAL/PLATELET
Abs Immature Granulocytes: 0.03 10*3/uL (ref 0.00–0.07)
Basophils Absolute: 0.1 10*3/uL (ref 0.0–0.1)
Basophils Relative: 1 %
Eosinophils Absolute: 0.1 10*3/uL (ref 0.0–0.5)
Eosinophils Relative: 1 %
HCT: 39.2 % (ref 36.0–46.0)
Hemoglobin: 12.6 g/dL (ref 12.0–15.0)
Immature Granulocytes: 0 %
Lymphocytes Relative: 22 %
Lymphs Abs: 2 10*3/uL (ref 0.7–4.0)
MCH: 28.2 pg (ref 26.0–34.0)
MCHC: 32.1 g/dL (ref 30.0–36.0)
MCV: 87.7 fL (ref 80.0–100.0)
Monocytes Absolute: 0.8 10*3/uL (ref 0.1–1.0)
Monocytes Relative: 9 %
Neutro Abs: 6.3 10*3/uL (ref 1.7–7.7)
Neutrophils Relative %: 67 %
Platelets: 353 10*3/uL (ref 150–400)
RBC: 4.47 MIL/uL (ref 3.87–5.11)
RDW: 12.2 % (ref 11.5–15.5)
WBC: 9.3 10*3/uL (ref 4.0–10.5)
nRBC: 0 % (ref 0.0–0.2)

## 2018-09-10 LAB — COMPREHENSIVE METABOLIC PANEL
ALT: 12 U/L (ref 0–44)
AST: 30 U/L (ref 15–41)
Albumin: 3.7 g/dL (ref 3.5–5.0)
Alkaline Phosphatase: 92 U/L (ref 38–126)
Anion gap: 10 (ref 5–15)
BUN: 12 mg/dL (ref 6–20)
CO2: 25 mmol/L (ref 22–32)
Calcium: 8.8 mg/dL — ABNORMAL LOW (ref 8.9–10.3)
Chloride: 97 mmol/L — ABNORMAL LOW (ref 98–111)
Creatinine, Ser: 0.77 mg/dL (ref 0.44–1.00)
GFR calc Af Amer: >60 mL/min (ref >60)
GFR calc non Af Amer: >60 mL/min (ref >60)
Glucose, Bld: 481 mg/dL — ABNORMAL HIGH (ref 70–99)
Potassium: 5.3 mmol/L — ABNORMAL HIGH (ref 3.5–5.1)
Sodium: 132 mmol/L — ABNORMAL LOW (ref 135–145)
Total Bilirubin: 0.7 mg/dL (ref 0.3–1.2)
Total Protein: 6 g/dL — ABNORMAL LOW (ref 6.5–8.1)

## 2018-09-10 LAB — CBG MONITORING, ED
Glucose-Capillary: 499 mg/dL — ABNORMAL HIGH (ref 70–99)
Glucose-Capillary: 318 mg/dL — ABNORMAL HIGH (ref 70–99)

## 2018-09-10 LAB — I-STAT BETA HCG BLOOD, ED (MC, WL, AP ONLY): I-stat hCG, quantitative: <5 m[IU]/mL (ref <5)

## 2018-09-10 LAB — LIPASE, BLOOD: Lipase: 28 U/L (ref 11–51)

## 2018-09-10 LAB — BRAIN NATRIURETIC PEPTIDE: B Natriuretic Peptide: 13.1 pg/mL (ref 0.0–100.0)

## 2018-09-10 MED ORDER — ONDANSETRON HCL 4 MG/2ML IJ SOLN
4.0000 mg | Freq: Once | INTRAMUSCULAR | Status: AC
  Administered 2018-09-10: 17:00:00 4 mg via INTRAVENOUS
  Filled 2018-09-10: qty 2

## 2018-09-10 MED ORDER — LIDOCAINE VISCOUS HCL 2 % MT SOLN
15.0000 mL | Freq: Once | OROMUCOSAL | Status: AC
  Administered 2018-09-10: 15 mL via ORAL
  Filled 2018-09-10: qty 15

## 2018-09-10 MED ORDER — INSULIN ASPART 100 UNIT/ML ~~LOC~~ SOLN
10.0000 [IU] | Freq: Once | SUBCUTANEOUS | Status: AC
  Administered 2018-09-10: 10 [IU] via SUBCUTANEOUS

## 2018-09-10 MED ORDER — SODIUM CHLORIDE 0.9 % IV BOLUS
1000.0000 mL | Freq: Once | INTRAVENOUS | Status: AC
  Administered 2018-09-10: 1000 mL via INTRAVENOUS

## 2018-09-10 MED ORDER — HYDROMORPHONE HCL 1 MG/ML IJ SOLN
0.5000 mg | Freq: Once | INTRAMUSCULAR | Status: AC
  Administered 2018-09-10: 0.5 mg via INTRAVENOUS
  Filled 2018-09-10: qty 1

## 2018-09-10 MED ORDER — ALUM & MAG HYDROXIDE-SIMETH 200-200-20 MG/5ML PO SUSP
30.0000 mL | Freq: Once | ORAL | Status: AC
  Administered 2018-09-10: 30 mL via ORAL
  Filled 2018-09-10: qty 30

## 2018-09-10 MED ORDER — KETOROLAC TROMETHAMINE 15 MG/ML IJ SOLN
15.0000 mg | Freq: Once | INTRAMUSCULAR | Status: AC
  Administered 2018-09-10: 15 mg via INTRAVENOUS
  Filled 2018-09-10: qty 1

## 2018-09-10 MED ORDER — IOHEXOL 350 MG/ML SOLN
100.0000 mL | Freq: Once | INTRAVENOUS | Status: AC | PRN
  Administered 2018-09-10: 100 mL via INTRAVENOUS

## 2018-09-10 NOTE — ED Notes (Signed)
Iv  Delayed due to pt going to c-t

## 2018-09-10 NOTE — Discharge Instructions (Signed)
Talk to your therapist about a possible somatization disorder and next steps of treatment. Continue taking your home medications as prescribed. Follow-up with your GI doctor at your scheduled appointment. Return to the emergency room with any new, worsening, concerning symptoms.

## 2018-09-10 NOTE — ED Notes (Signed)
On her phone playing games

## 2018-09-10 NOTE — ED Notes (Signed)
Asking for food

## 2018-09-10 NOTE — ED Triage Notes (Signed)
Pt brought in by EMS for back to back seizures witnessed by a friend.  Pt also had a CBG of 456.  Pt c/o of chest pain along with nausea and vomiting x1 week

## 2018-09-10 NOTE — ED Provider Notes (Signed)
[facility] EMERGENCY DEPARTMENT Provider Note   CSN: [id number] Arrival date & time: 09/10/18  1356     History   Chief Complaint Chief Complaint  Patient presents with   Seizures   Hyperglycemia    HPI Nancy [name] is a 18 y.o. female presenting for evaluation of nausea, vomiting, and seizure-like activity.  Patient states for the past week, she has had persistent nausea, vomiting, abdominal pain.  She also reports left-sided chest pain that radiates to her back.  Pain is worse when she takes a deep breath in.  It is constant and severe.  She is not taking anything for this.  Patient states she had multiple back-to-back seizure episodes where she did not become awake in between.  No incontinence of bowel or bladder.  No postictal period.  Patient's friend called EMS due to frequent seizures.  Patient states her seizures have been more frequent this past week.  She has been taking all of her medications as prescribed, including her Keppra.  She has not followed up with her neurologist who is with [place].  States she does not check her blood sugars regularly, does not know they have been high this past week.  She has been unable to tolerate p.o., reports multiple episodes of vomiting a day.  Denies fevers, chills, cough, urinary symptoms, normal bowel movements.  Additional history obtained from chart review.  Patient with a history of chronic abdominal and chest pain.  Seen frequently for the same along with nausea and vomiting.  Patient also seen frequently with seizure-like activity.  She has had an EEG during which there is no findings to indicate epilepsy while patient was having tremors.  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  Additional history of diabetes, depression, CHF.     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[place])    Premature baby    Seizures ([place])    Type II diabetes mellitus ([place])    insulin dependant    Patient Active Problem List   Diagnosis Date Noted   Hypokalemia 08/04/2018   Hypomagnesemia 08/04/2018   Acute kidney injury ([place]) 08/04/2018   Seizure ([place]) 08/03/2018   Depression with anxiety 07/25/2018   DKA (diabetic ketoacidoses) ([place]) 07/25/2018   Uncontrolled seizures ([place]) 06/14/2018   Syncope 01/30/2018   Orthostatic hypotension 01/24/2018   DKA (diabetic ketoacidosis) ([place]) 01/24/2018   Chronic abdominal pain 12/24/2017   Sinus tachycardia by electrocardiogram    Acute lower UTI    Uncontrolled type 2 diabetes mellitus with hyperglycemia ([place])    Chest pain 12/19/2017   Generalized abdominal pain 08/21/2017   Non compliance with medical treatment 01/27/2012   Adjustment disorder 09/16/2011   Diabetes type 2, uncontrolled ([place]) 09/15/2011   Dyspepsia    Acanthosis nigricans    Goiter    Obesity 06/14/2010   Hypertension 06/14/2010    Past Surgical History:  Procedure Laterality Date   ABDOMINAL HERNIA REPAIR     "I was a baby"   HERNIA REPAIR     TONSILLECTOMY AND ADENOIDECTOMY     WISDOM TOOTH EXTRACTION  2017     OB History   No obstetric history on file.      Home Medications    Prior to Admission medications   Medication Sig Start Date End Date Taking? Authorizing Provider  atorvastatin (LIPITOR) 20 MG tablet Take 1 tablet (20 mg total) by mouth daily. 07/26/18 10/24/18  Mercy Riding, MD  Blood Glucose Monitoring Suppl (TRUE METRIX METER) w/Device KIT Use as instructed. Monitor blood glucose levels twice per day 12/02/17   [name], NP  diclofenac sodium (VOLTAREN) 1 % GEL Apply 2 g topically 3 (three) times daily as needed (pain).     [provider]  FLUoxetine (PROZAC) 20 MG capsule Take 1 capsule (20 mg total) by mouth at bedtime. Please allow patient to pick up 30 day supply for free 07/13/18 09/06/18  [name], NP  gabapentin (NEURONTIN) 300 MG capsule Take 1 capsule (300 mg total) by mouth at bedtime for 30 days. 06/02/18 07/05/27  [name], NP  glucagon 1 MG injection Use for Severe Hypoglycemia . Inject 1 mg intramuscularly if unresponsive, unable to swallow, unconscious and/or has seizure Patient taking differently: Inject 1 mg into the muscle once as needed (for severe hypoglycemia- if unresponsive, unable to swallow, unconscious, and/or has had seizure(s)).  07/20/13 12/19/25  Renato Shin, MD  glucose blood (TRUE METRIX BLOOD GLUCOSE TEST) test strip Use as instructed 02/15/18   [name], NP  insulin glargine (LANTUS) 100 UNIT/ML injection Inject 0.2 mLs (20 Units total) into the skin 2 (two) times daily. Patient taking differently: Inject 60 Units into the skin at bedtime.  07/26/18   Mercy Riding, MD  insulin lispro (HUMALOG) 100 UNIT/ML injection Inject 0-0.15 mLs (0-15 Units total) into the skin 4 (four) times daily -  with meals and at bedtime. Patient not taking: Reported on 09/03/2018 07/26/18 08/25/18  Mercy Riding, MD  Insulin Pen Needle (B-D UF III MINI PEN NEEDLES) 31G X 5 MM MISC Use as instructed. Monitor blood glucose levels twice per day 02/15/18   [name], NP  lamoTRIgine (LAMICTAL) 25 MG tablet Take 1 tablet (25 mg total) by mouth 2 (two) times daily. 08/08/18   [place] Donnamarie Poag, DO  meloxicam (MOBIC) 7.5 MG tablet Take 7.5 mg by mouth at bedtime as needed for pain.     [provider]  prochlorperazine (COMPAZINE) 10 MG tablet Take 1 tablet (10 mg total) by mouth 2 (two) times daily as needed for nausea or vomiting. 09/03/18   Isla Pence, MD  promethazine (PHENERGAN) 25 MG tablet Take 1 tablet (25 mg total) by mouth every 8 (eight) hours as needed for nausea or  vomiting. Patient not taking: Reported on 09/06/2018 07/29/18   Ripley Fraise, MD  propranolol (INDERAL) 20 MG tablet Take 1 tablet (20 mg total) by mouth 2 (two) times daily. Please allow patient to pick up 30 day supply for free 07/13/18 10/06/18  [name], NP  QUEtiapine (SEROQUEL) 100 MG tablet Take 400 mg by mouth at bedtime.     [provider]  topiramate (TOPAMAX) 25 MG tablet Take 1 tablet (25 mg total) by mouth 2 (two) times daily. Please allow patient to pick up 30 day supply for free 07/13/18 10/06/18  [name], NP  trazodone (DESYREL) 300 MG tablet Take 300 mg by mouth at bedtime.    [provider]  TRUEPLUS LANCETS 28G MISC Use as instructed. Monitor blood glucose levels twice  per day 12/02/17   [name], NP    Family History Family History  Problem Relation Age of Onset   Diabetes Mother    Hypertension Mother    Obesity Mother    Asthma Mother    Allergic rhinitis Mother    Eczema Mother    Diabetes Father    Hypertension Father    Obesity Father    Hyperlipidemia Father    Hypertension Paternal Aunt    Hypertension Maternal Grandfather    Colon cancer Maternal Grandfather    Diabetes Paternal Grandmother    Obesity Paternal Grandmother    Diabetes Paternal Grandfather    Obesity Paternal Grandfather    Angioedema Neg Hx    Immunodeficiency Neg Hx    Urticaria Neg Hx    Stomach cancer Neg Hx    Esophageal cancer Neg Hx     Social History Social History   Tobacco Use   Smoking status: Never Smoker   Smokeless tobacco: Never Used  Substance Use Topics   Alcohol use: No    Alcohol/week: 0.0 standard drinks   Drug use: No     Allergies   Ibuprofen   Review of Systems Review of Systems  Cardiovascular: Positive for chest pain.  Gastrointestinal: Positive for abdominal pain, nausea and vomiting.  Neurological: Positive for seizures (Seizure-like activity).  All other systems reviewed  and are negative.    Physical Exam Updated Vital Signs BP (!) 150/92    Pulse (!) 106    Temp 98.6 F (37 C) (Oral)    Resp 20    Ht _0  (1.6 m)    Wt 84.4 kg    LMP 08/30/2018    SpO2 100%    BMI 32.95 kg/m   Physical Exam Vitals signs and nursing note reviewed.  Constitutional:      General: She is not in acute distress.    Appearance: She is well-developed.     Comments: Obese female resting comfortably in the bed in no acute distress  HENT:     Head: Normocephalic and atraumatic.  Eyes:     Extraocular Movements: Extraocular movements intact.     Conjunctiva/sclera: Conjunctivae normal.     Pupils: Pupils are equal, round, and reactive to light.  Neck:     Musculoskeletal: Normal range of motion and neck supple.  Cardiovascular:     Rate and Rhythm: Regular rhythm. Tachycardia present.     Pulses: Normal pulses.     Comments: Heart rate around 110 Pulmonary:     Effort: Pulmonary effort is normal. No respiratory distress.     Breath sounds: Normal breath sounds. No wheezing.  Abdominal:     General: There is no distension.     Palpations: Abdomen is soft. There is no mass.     Tenderness: There is abdominal tenderness. There is no guarding or rebound.     Comments: Generalized tenderness palpation the abdomen.  Soft without rigidity, guarding, distention.  Negative rebound.  Musculoskeletal: Normal range of motion.  Skin:    General: Skin is warm and dry.     Capillary Refill: Capillary refill takes less than 2 seconds.  Neurological:     Mental Status: She is alert and oriented to person, place, and time.     Comments: No tremors or seizure-like activity noted.      ED Treatments / Results  Labs (all labs ordered are listed, but only abnormal results are displayed) Labs Reviewed  COMPREHENSIVE METABOLIC PANEL -  Abnormal; Notable for the following components:      Result Value   Sodium 132 (*)    Potassium 5.3 (*)    Chloride 97 (*)    Glucose, Bld 481  (*)    Calcium 8.8 (*)    Total Protein 6.0 (*)    All other components within normal limits  CBG MONITORING, ED - Abnormal; Notable for the following components:   Glucose-Capillary 499 (*)    All other components within normal limits  CBC WITH DIFFERENTIAL/PLATELET  LIPASE, BLOOD  BRAIN NATRIURETIC PEPTIDE  LEVETIRACETAM LEVEL  I-STAT BETA HCG BLOOD, ED ([facility], [facility], [facility] ONLY)    EKG EKG Interpretation  Date/Time:  Friday September 10 2018 14:06:51 EDT Ventricular Rate:  115 PR Interval:    QRS Duration: 93 QT Interval:  323 QTC Calculation: 447 R Axis:   61 Text Interpretation:  Sinus tachycardia Probable left ventricular hypertrophy ST elev, probable normal early repol pattern No significant change since last tracing Abnormal ECG Confirmed by Carmin Muskrat [phone]) on 09/10/2018 3:33:53 PM   Radiology Dg Chest 2 View  Result Date: 09/10/2018 CLINICAL DATA:  Left-sided chest pain with shortness of breath EXAM: CHEST - 2 VIEW COMPARISON:  09/03/2018 FINDINGS: The heart size and mediastinal contours are within normal limits. Both lungs are clear. The visualized skeletal structures are unremarkable. IMPRESSION: No active cardiopulmonary disease. Electronically Signed   By: Davina Poke M.D.   On: 09/10/2018 15:09   Ct Angio Chest Pe W And/or Wo Contrast  Result Date: 09/10/2018 CLINICAL DATA:  18 year old female with shortness of breath and high pretest probability for pulmonary embolism. EXAM: CT ANGIOGRAPHY CHEST WITH CONTRAST TECHNIQUE: Multidetector CT imaging of the chest was performed using the standard protocol during bolus administration of intravenous contrast. Multiplanar CT image reconstructions and MIPs were obtained to evaluate the vascular anatomy. CONTRAST:  188m OMNIPAQUE IOHEXOL 350 MG/ML SOLN COMPARISON:  Prior CT scan of the heart 01/14/2018 FINDINGS: Cardiovascular: Adequate opacification of the pulmonary arteries to the segmental level. No evidence of central  filling defect to suggest acute pulmonary embolus. The main pulmonary artery is normal in size. 2 vessel aortic arch. The right brachiocephalic and left common carotid artery share a common origin. No evidence of aneurysm or dissection. The heart is normal in size. No pericardial effusion. Mediastinum/Nodes: Unremarkable thyroid gland. 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 No suspicious lymphadenopathy. Unremarkable esophagus. Lungs/Pleura: Lungs are clear. No pleural effusion or pneumothorax. Upper Abdomen: No acute abnormality. Musculoskeletal: No acute fracture or aggressive appearing lytic or blastic osseous lesion. Review of the MIP images confirms the above findings. IMPRESSION: 1. Negative for acute pulmonary embolus, pneumonia or other acute cardiopulmonary process. Electronically Signed   By: HJacqulynn CadetM.D.   On: 09/10/2018 16:24    Procedures Procedures (including critical care time)  Medications Ordered in ED Medications  ketorolac (TORADOL) 15 MG/ML injection 15 mg (15 mg Intravenous Given 09/10/18 1533)  sodium chloride 0.9 % bolus 1,000 mL (1,000 mLs Intravenous New Bag/Given 09/10/18 1637)  insulin aspart (novoLOG) injection 10 Units (10 Units Subcutaneous Given 09/10/18 1639)  alum & mag hydroxide-simeth (MAALOX/MYLANTA) 200-200-20 MG/5ML suspension 30 mL (30 mLs Oral Given 09/10/18 1642)    And  lidocaine (XYLOCAINE) 2 % viscous mouth solution 15 mL (15 mLs Oral Given 09/10/18 1643)  ondansetron (ZOFRAN) injection 4 mg (4 mg Intravenous Given 09/10/18 1637)  iohexol (OMNIPAQUE) 350 MG/ML injection 100 mL (100 mLs Intravenous Contrast Given  09/10/18 1606)  HYDROmorphone (DILAUDID) injection 0.5 mg (0.5 mg Intravenous Given 09/10/18 1717)     Initial Impression / Assessment and Plan / ED Course  I have reviewed the triage vital signs and the nursing notes.  Pertinent labs & imaging results that were available during my care of the  patient were reviewed by me and considered in my medical decision making (see chart for details).        Pt presenting for evaluation of n/v, abd pain, cp, and sz-like activity.  On chart review, patient seen frequently with the same.  Has been suggested that she has somatization disorder, which would be consistent with her frequent visits and nonepileptic seizure-like activity.  No seizures witnessed with EMS or in the ED, doubt status despite description of back-to-back seizures.  Will check labs, as CBG is elevated, however low suspicion for acute intra-abdominal process.  Patient is tachycardic, and reporting shortness of breath worse with inspiration.  As such, consider possible PE.  Labs overall reassuring, no leukocytosis.  Kidney, liver, pacreatic function reassuring.  Glucose elevated at 480, fluids and insulin given.  CTA negative for PE.  On reassessment, patient reports she still having pain, but nausea is improved.  Will p.o. challenge, however I do not believe she needs further evaluation.  Encouraged close follow-up with her GI doctor and therapist.  Encourage patient to discuss somatization disorder with her therapist and possible treatments.  At this time, patient appears safe for discharge.  Return precautions given.  Patient states she understands and agrees to plan.  Final Clinical Impressions(s) / ED Diagnoses   Final diagnoses:  Non-intractable vomiting with nausea, unspecified vomiting type  Chronic abdominal pain  Seizure-like activity [facility])    ED Discharge Orders    None       Franchot Heidelberg, PA-C 09/10/18 1746    Carmin Muskrat, MD 09/11/18 [phone]

## 2018-09-12 ENCOUNTER — Encounter (HOSPITAL_COMMUNITY): Payer: Self-pay

## 2018-09-12 ENCOUNTER — Inpatient Hospital Stay (HOSPITAL_COMMUNITY)
Admission: EM | Admit: 2018-09-12 | Discharge: 2018-09-15 | DRG: 638 | Disposition: A | Discharge status: Other Acute Inpatient Hospital | Payer: Medicaid Other | Attending: Family Medicine | Admitting: Family Medicine

## 2018-09-12 DIAGNOSIS — R45851 Suicidal ideations: Secondary | ICD-10-CM

## 2018-09-12 DIAGNOSIS — K921 Melena: Secondary | ICD-10-CM | POA: Diagnosis present

## 2018-09-12 DIAGNOSIS — K5900 Constipation, unspecified: Secondary | ICD-10-CM | POA: Diagnosis present

## 2018-09-12 DIAGNOSIS — R000 Tachycardia, unspecified: Secondary | ICD-10-CM | POA: Diagnosis present

## 2018-09-12 DIAGNOSIS — E1110 Type 2 diabetes mellitus with ketoacidosis without coma: Secondary | ICD-10-CM

## 2018-09-12 DIAGNOSIS — R109 Unspecified abdominal pain: Secondary | ICD-10-CM | POA: Diagnosis present

## 2018-09-12 DIAGNOSIS — N179 Acute kidney failure, unspecified: Secondary | ICD-10-CM

## 2018-09-12 DIAGNOSIS — Z833 Family history of diabetes mellitus: Secondary | ICD-10-CM

## 2018-09-12 DIAGNOSIS — F332 Major depressive disorder, recurrent severe without psychotic features: Secondary | ICD-10-CM

## 2018-09-12 DIAGNOSIS — Z20828 Contact with and (suspected) exposure to other viral communicable diseases: Secondary | ICD-10-CM | POA: Diagnosis present

## 2018-09-12 DIAGNOSIS — Z818 Family history of other mental and behavioral disorders: Secondary | ICD-10-CM

## 2018-09-12 DIAGNOSIS — Z91199 Patient's noncompliance with other medical treatment and regimen due to unspecified reason: Secondary | ICD-10-CM

## 2018-09-12 DIAGNOSIS — Z915 Personal history of self-harm: Secondary | ICD-10-CM

## 2018-09-12 DIAGNOSIS — Z794 Long term (current) use of insulin: Secondary | ICD-10-CM

## 2018-09-12 DIAGNOSIS — Z79899 Other long term (current) drug therapy: Secondary | ICD-10-CM

## 2018-09-12 DIAGNOSIS — G8929 Other chronic pain: Secondary | ICD-10-CM | POA: Diagnosis present

## 2018-09-12 DIAGNOSIS — E1010 Type 1 diabetes mellitus with ketoacidosis without coma: Principal | ICD-10-CM | POA: Diagnosis present

## 2018-09-12 DIAGNOSIS — F419 Anxiety disorder, unspecified: Secondary | ICD-10-CM | POA: Diagnosis present

## 2018-09-12 DIAGNOSIS — Z9119 Patient's noncompliance with other medical treatment and regimen: Secondary | ICD-10-CM

## 2018-09-12 DIAGNOSIS — F445 Conversion disorder with seizures or convulsions: Secondary | ICD-10-CM | POA: Diagnosis present

## 2018-09-12 LAB — TYPE AND SCREEN
ABO/RH(D): A POS
Antibody Screen: NEGATIVE

## 2018-09-12 LAB — COMPREHENSIVE METABOLIC PANEL
ALT: 14 U/L (ref 0–44)
AST: 18 U/L (ref 15–41)
Albumin: 3.6 g/dL (ref 3.5–5.0)
Alkaline Phosphatase: 94 U/L (ref 38–126)
Anion gap: 16 — ABNORMAL HIGH (ref 5–15)
BUN: 16 mg/dL (ref 6–20)
CO2: 20 mmol/L — ABNORMAL LOW (ref 22–32)
Calcium: 9 mg/dL (ref 8.9–10.3)
Chloride: 98 mmol/L (ref 98–111)
Creatinine, Ser: 1.22 mg/dL — ABNORMAL HIGH (ref 0.44–1.00)
GFR calc Af Amer: >60 mL/min (ref >60)
GFR calc non Af Amer: >60 mL/min (ref >60)
Glucose, Bld: 412 mg/dL — ABNORMAL HIGH (ref 70–99)
Potassium: 4.7 mmol/L (ref 3.5–5.1)
Sodium: 134 mmol/L — ABNORMAL LOW (ref 135–145)
Total Bilirubin: 0.7 mg/dL (ref 0.3–1.2)
Total Protein: 6.8 g/dL (ref 6.5–8.1)

## 2018-09-12 LAB — CBC
HCT: 40.5 % (ref 36.0–46.0)
Hemoglobin: 13.6 g/dL (ref 12.0–15.0)
MCH: 28.9 pg (ref 26.0–34.0)
MCHC: 33.6 g/dL (ref 30.0–36.0)
MCV: 86.2 fL (ref 80.0–100.0)
Platelets: 354 10*3/uL (ref 150–400)
RBC: 4.7 MIL/uL (ref 3.87–5.11)
RDW: 12.2 % (ref 11.5–15.5)
WBC: 11.7 10*3/uL — ABNORMAL HIGH (ref 4.0–10.5)
nRBC: 0 % (ref 0.0–0.2)

## 2018-09-12 LAB — POC OCCULT BLOOD, ED: Fecal Occult Bld: NEGATIVE

## 2018-09-12 LAB — I-STAT BETA HCG BLOOD, ED (MC, WL, AP ONLY): I-stat hCG, quantitative: <5 m[IU]/mL (ref <5)

## 2018-09-12 LAB — SALICYLATE LEVEL: Salicylate Lvl: <7 mg/dL (ref 2.8–30.0)

## 2018-09-12 LAB — ETHANOL: Alcohol, Ethyl (B): <10 mg/dL (ref <10)

## 2018-09-12 LAB — ACETAMINOPHEN LEVEL: Acetaminophen (Tylenol), Serum: <10 ug/mL — ABNORMAL LOW (ref 10–30)

## 2018-09-12 MED ORDER — POTASSIUM CHLORIDE 10 MEQ/100ML IV SOLN
10.0000 meq | INTRAVENOUS | Status: AC
  Administered 2018-09-13 (×2): 10 meq via INTRAVENOUS
  Filled 2018-09-12 (×3): qty 100

## 2018-09-12 MED ORDER — DEXTROSE-NACL 5-0.45 % IV SOLN: INTRAVENOUS | Status: DC

## 2018-09-12 MED ORDER — SODIUM CHLORIDE 0.9 % IV BOLUS
1000.0000 mL | Freq: Once | INTRAVENOUS | Status: AC
  Administered 2018-09-12: 1000 mL via INTRAVENOUS

## 2018-09-12 MED ORDER — INSULIN REGULAR(HUMAN) IN NACL 100-0.9 UT/100ML-% IV SOLN
INTRAVENOUS | Status: DC
  Administered 2018-09-13: 2.6 [IU]/h via INTRAVENOUS
  Filled 2018-09-12: qty 100

## 2018-09-12 NOTE — ED Notes (Signed)
TTS Consult removed from consult list. RN will contact TTS clinician to let her know if TTS Consult is still needed.

## 2018-09-12 NOTE — ED Triage Notes (Signed)
Pt comes via [facility] for generalized abd pain that has been going on for several days with blood in her emesis and stool, pt also concerned with recent increase in seizure activity. Pt also hyperglycemia, seen her recently for the same, PTA received 4mg  zofran. Pt also endorses SI, plan to take a lot of ibuprofen, denies HI/AVH

## 2018-09-12 NOTE — ED Provider Notes (Addendum)
[facility] EMERGENCY DEPARTMENT Provider Note   CSN: [id number] Arrival date & time: 09/12/18  2220     History   Chief Complaint Chief Complaint  Patient presents with  . GI Bleeding  . Hyperglycemia  . Suicidal    HPI Nancy [name] is a 18 y.o. female with a history of CHF, diabetes mellitus type 2, and seizure-like activity who presents to the emergency department by EMS with multiple complaints.  She reports that she has been having dark black stool for the last 2 days.  Stool has been normal in consistency.  She also reports that she has been having frequent episodes of vomiting, some episodes appear to be bright red and others have been dark.  She endorses diffuse associated abdominal pain.  She also notes that she has been progressively more short of breath over the last few days.  She also reports that she has been having suicidal thoughts.  Reports this is been ongoing for months, but has been worsening.  She had a plan to overdose on NSAIDs.  She did take 200 mg of naproxen earlier today for her abdominal pain.  Reports a history of previous 7 suicide attempts.  Denies HI or auditory visual hallucinations.  She denies fever, chills, diarrhea, constipation, back pain, vaginal bleeding, dysuria, hematuria, chest pain, palpitations, leg swelling.      The history is provided by the patient. No language interpreter was used.    Past Medical History:  Diagnosis Date  . Acanthosis nigricans   . Anxiety   . CHF (congestive heart failure) ([place])   . Chronic lower back pain   . Depression   . Dyspepsia   . Obesity   . Ovarian cyst    pt is not aware of this hx (11/24/2017)  . Pre-diabetes   . 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 Premature baby   . Seizures ([place])   . Type II diabetes mellitus (HCC)    insulin dependant    Patient Active Problem List   Diagnosis Date Noted  . DKA, type 1 ([place]) 09/13/2018  . Suicidal ideations 09/13/2018  .  Hypokalemia 08/04/2018  . Hypomagnesemia 08/04/2018  . Acute kidney injury ([place]) 08/04/2018  . Seizure ([place]) 08/03/2018  . Depression with anxiety 07/25/2018  . DKA (diabetic ketoacidoses) ([place]) 07/25/2018  . Uncontrolled seizures ([place]) 06/14/2018  . Syncope 01/30/2018  . Orthostatic hypotension 01/24/2018  . DKA (diabetic ketoacidosis) ([place]) 01/24/2018  . Chronic abdominal pain 12/24/2017  . Sinus tachycardia by electrocardiogram   . Acute lower UTI   . Uncontrolled type 2 diabetes mellitus with hyperglycemia ([place])   . Chest pain 12/19/2017  . Generalized abdominal pain 08/21/2017  . Non compliance with medical treatment 01/27/2012  . Adjustment disorder 09/16/2011  . Diabetes type 2, uncontrolled ([place]) 09/15/2011  . Dyspepsia   . Acanthosis nigricans   . Goiter   . Obesity 06/14/2010  . Hypertension 06/14/2010    Past Surgical History:  Procedure Laterality Date  . ABDOMINAL HERNIA REPAIR     "I was a baby"  . HERNIA REPAIR    . TONSILLECTOMY AND ADENOIDECTOMY    . WISDOM TOOTH EXTRACTION  2017     OB History   No obstetric history on file.      Home Medications    Prior to Admission medications   Medication Sig Start Date End Date Taking? Authorizing Provider  atorvastatin (LIPITOR) 20 MG tablet Take 1 tablet (20 mg total) by mouth daily.  07/26/18 10/24/18  Mercy Riding, MD  Blood Glucose Monitoring Suppl (TRUE METRIX METER) w/Device KIT Use as instructed. Monitor blood glucose levels twice per day 12/02/17   [name], NP  diclofenac sodium (VOLTAREN) 1 % GEL Apply 2 g topically 3 (three) times daily as needed (pain).     [provider]  FLUoxetine (PROZAC) 20 MG capsule Take 1 capsule (20 mg total) by mouth at bedtime. Please allow patient to pick up 30 day supply for free 07/13/18 09/06/18  [name], NP  gabapentin (NEURONTIN) 300 MG capsule Take 1 capsule (300 mg total) by mouth at bedtime for 30 days. 06/02/18 07/05/27  [name], NP   glucagon 1 MG injection Use for Severe Hypoglycemia . Inject 1 mg intramuscularly if unresponsive, unable to swallow, unconscious and/or has seizure Patient taking differently: Inject 1 mg into the muscle once as needed (for severe hypoglycemia- if unresponsive, unable to swallow, unconscious, and/or has had seizure(s)).  07/20/13 12/19/25  Renato Shin, MD  glucose blood (TRUE METRIX BLOOD GLUCOSE TEST) test strip Use as instructed 02/15/18   [name], NP  insulin glargine (LANTUS) 100 UNIT/ML injection Inject 0.2 mLs (20 Units total) into the skin 2 (two) times daily. Patient taking differently: Inject 60 Units into the skin at bedtime.  07/26/18   Mercy Riding, MD  insulin lispro (HUMALOG) 100 UNIT/ML injection Inject 0-0.15 mLs (0-15 Units total) into the skin 4 (four) times daily -  with meals and at bedtime. Patient not taking: Reported on 09/03/2018 07/26/18 08/25/18  Mercy Riding, MD  Insulin Pen Needle (B-D UF III MINI PEN NEEDLES) 31G X 5 MM MISC Use as instructed. Monitor blood glucose levels twice per day 02/15/18   [name], NP  lamoTRIgine (LAMICTAL) 25 MG tablet Take 1 tablet (25 mg total) by mouth 2 (two) times daily. 08/08/18   [place] Donnamarie Poag, DO  meloxicam (MOBIC) 7.5 MG tablet Take 7.5 mg by mouth at bedtime as needed for pain.     [provider]  prochlorperazine (COMPAZINE) 10 MG tablet Take 1 tablet (10 mg total) by mouth 2 (two) times daily as needed for nausea or vomiting. 09/03/18   Isla Pence, MD  propranolol (INDERAL) 20 MG tablet Take 1 tablet (20 mg total) by mouth 2 (two) times daily. Please allow patient to pick up 30 day supply for free 07/13/18 10/06/18  [name], NP  QUEtiapine (SEROQUEL) 100 MG tablet Take 400 mg by mouth at bedtime.     [provider]  topiramate (TOPAMAX) 25 MG tablet Take 1 tablet (25 mg total) by mouth 2 (two) times daily. Please allow patient to pick up 30 day supply for free 07/13/18 10/06/18  [name], NP  trazodone (DESYREL) 300 MG tablet Take 300 mg by mouth at bedtime.    [provider]  TRUEPLUS LANCETS 28G MISC Use as instructed. Monitor blood glucose levels twice per day 12/02/17   [name], NP    Family History Family History  Problem Relation Age of Onset  . Diabetes Mother   . Hypertension Mother   . Obesity Mother   . Asthma Mother   . Allergic rhinitis Mother   . Eczema Mother   . Diabetes Father   . Hypertension Father   . Obesity Father   . Hyperlipidemia Father   . Hypertension Paternal Aunt   . Hypertension Maternal Grandfather   . Colon cancer Maternal  Grandfather   . Diabetes Paternal Grandmother   . Obesity Paternal Grandmother   . Diabetes Paternal Grandfather   . Obesity Paternal Grandfather   . Angioedema Neg Hx   . Immunodeficiency Neg Hx   . Urticaria Neg Hx   . Stomach cancer Neg Hx   . Esophageal cancer Neg Hx     Social History Social History   Tobacco Use  . Smoking status: Never Smoker  . Smokeless tobacco: Never Used  Substance Use Topics  . Alcohol use: No    Alcohol/week: 0.0 standard drinks  . Drug use: No     Allergies   Ibuprofen   Review of Systems Review of Systems  Constitutional: Negative for activity change, chills and fever.  HENT: Negative for congestion and sore throat.   Respiratory: Negative for shortness of breath and wheezing.   Cardiovascular: Negative for chest pain, palpitations and leg swelling.  Gastrointestinal: Positive for abdominal pain, blood in stool, nausea and vomiting. Negative for diarrhea.  Genitourinary: Negative for dysuria.  Musculoskeletal: Negative for back pain.  Skin: Negative for rash.  Allergic/Immunologic: Negative for immunocompromised state.  Neurological: Negative for dizziness, seizures, syncope, weakness and headaches.  Psychiatric/Behavioral: Negative for confusion.   Physical Exam Updated Vital Signs BP (!) 115/93   Pulse (!) 127   Temp 98.7 F (37.1  C) (Oral)   Resp (!) 25   Ht 5' 3"  (1.6 m)   Wt 84.4 kg   LMP 08/30/2018 Comment: pt shielded  SpO2 99%   BMI 32.95 kg/m   Physical Exam Vitals signs and nursing note reviewed.  Constitutional:      General: She is not in acute distress.    Comments: Patient was noted to have full body shaking when I entered the room, but shaking stopped and patient was immediately alert, oriented, and able to answer questions.   HENT:     Head: Normocephalic.     Comments: No wounds to the tongue.    Mouth/Throat:     Mouth: Mucous membranes are dry.  Eyes:     Conjunctiva/sclera: Conjunctivae normal.  Neck:     Musculoskeletal: Neck supple.  Cardiovascular:     Rate and Rhythm: Regular rhythm. Tachycardia present.     Heart sounds: No murmur. No friction rub. No gallop.   Pulmonary:     Effort: Pulmonary effort is normal. No respiratory distress.     Breath sounds: No stridor. No wheezing, rhonchi or rales.  Chest:     Chest wall: No tenderness.  Abdominal:     General: There is no distension.     Palpations: Abdomen is soft. There is no mass.     Tenderness: There is abdominal tenderness. There is no right CVA tenderness, left CVA tenderness, guarding or rebound.     Hernia: No hernia is present.     Comments: Tender to palpation over the left CVA.  She has diffuse tenderness to palpation throughout the abdomen that seems to improve with distraction.  Abdomen is soft and nondistended.  Normoactive bowel sounds.  Genitourinary:    Comments: Chaperoned exam.  No external hemorrhoids noted.  No fissures or tears to the skin.  No tenderness is noted.  Soft orange-colored stool with DRE.  Normal tone of the anus. Skin:    General: Skin is warm.     Capillary Refill: Capillary refill takes 2 to 3 seconds.     Findings: No rash.  Neurological:     Mental Status: She is  alert.  Psychiatric:        Behavior: Behavior normal.    ED Treatments / Results  Labs (all labs ordered are listed,  but only abnormal results are displayed) Labs Reviewed  COMPREHENSIVE METABOLIC PANEL - Abnormal; Notable for the following components:      Result Value   Sodium 134 (*)    CO2 20 (*)    Glucose, Bld 412 (*)    Creatinine, Ser 1.22 (*)    Anion gap 16 (*)    All other components within normal limits  CBC - Abnormal; Notable for the following components:   WBC 11.7 (*)    All other components within normal limits  ACETAMINOPHEN LEVEL - Abnormal; Notable for the following components:   Acetaminophen (Tylenol), Serum <10 (*)    All other components within normal limits  URINALYSIS, ROUTINE W REFLEX MICROSCOPIC - Abnormal; Notable for the following components:   APPearance HAZY (*)    Glucose, UA >=500 (*)    Ketones, ur 20 (*)    Bacteria, UA FEW (*)    All other components within normal limits  BASIC METABOLIC PANEL - Abnormal; Notable for the following components:   Glucose, Bld 281 (*)    Creatinine, Ser 1.20 (*)    All other components within normal limits  CBG MONITORING, ED - Abnormal; Notable for the following components:   Glucose-Capillary 348 (*)    All other components within normal limits  POCT I-STAT 7, (LYTES, BLD GAS, ICA,H+H) - Abnormal; Notable for the following components:   pH, Arterial 7.511 (*)    pO2, Arterial 116.0 (*)    Acid-Base Excess 4.0 (*)    Sodium 132 (*)    All other components within normal limits  CBG MONITORING, ED - Abnormal; Notable for the following components:   Glucose-Capillary 324 (*)    All other components within normal limits  CBG MONITORING, ED - Abnormal; Notable for the following components:   Glucose-Capillary 275 (*)    All other components within normal limits  SARS CORONAVIRUS 2 (TAT 6-24 HRS)  ETHANOL  SALICYLATE LEVEL  RAPID URINE DRUG SCREEN, HOSP PERFORMED  LACTIC ACID, PLASMA  BRAIN NATRIURETIC PEPTIDE  TSH  T4, FREE  T3, FREE  LIPASE, BLOOD  BASIC METABOLIC PANEL  BASIC METABOLIC PANEL  BASIC METABOLIC PANEL   BASIC METABOLIC PANEL  CBC  PREGNANCY, URINE  POC OCCULT BLOOD, ED  I-STAT BETA HCG BLOOD, ED ([facility], [facility], AP ONLY)  I-STAT BETA HCG BLOOD, ED (MC, WL, AP ONLY)  I-STAT ARTERIAL BLOOD GAS, ED  TYPE AND SCREEN  ABO/RH  TROPONIN I (HIGH SENSITIVITY)  TROPONIN I (HIGH SENSITIVITY)    EKG EKG Interpretation  Date/Time:  Sunday September 12 2018 22:31:32 EDT Ventricular Rate:  138 PR Interval:    QRS Duration: 91 QT Interval:  292 QTC Calculation: 443 R Axis:   77 Text Interpretation:  Sinus tachycardia Multiple ventricular premature complexes Low voltage with right axis deviation No significant change was found Confirmed by Ezequiel Essex [id number]) on 09/13/2018 12:18:55 AM   Radiology Dg Chest 2 View  Result Date: 09/13/2018 CLINICAL DATA:  Chest pain EXAM: CHEST - 2 VIEW COMPARISON:  September 10, 2018 FINDINGS: The heart size and mediastinal contours are within normal limits. Both lungs are clear. The visualized skeletal structures are unremarkable. IMPRESSION: No acute cardiopulmonary disease. Electronically Signed   By: Prudencio Pair M.D.   On: 09/13/2018 02:16    Procedures .Critical Care Performed by: Sherryle Lis,  Mia A, PA-C Authorized by: Joanne Gavel, PA-C   Critical care provider statement:    Critical care time (minutes):  45   Critical care time was exclusive of:  Separately billable procedures and treating other patients and teaching time   Critical care was necessary to treat or prevent imminent or life-threatening deterioration of the following conditions:  Metabolic crisis   Critical care was time spent personally by me on the following activities:  Ordering and performing treatments and interventions, ordering and review of laboratory studies, ordering and review of radiographic studies, pulse oximetry, re-evaluation of patient's condition, review of old charts, obtaining history from patient or surrogate, examination of patient, evaluation of patient's response to  treatment and development of treatment plan with patient or surrogate   (including critical care time)  Medications Ordered in ED Medications  insulin regular, human (MYXREDLIN) 100 units/ 100 mL infusion (4.3 Units/hr Intravenous Rate/Dose Change 09/13/18 0250)  potassium chloride 10 mEq in 100 mL IVPB (10 mEq Intravenous New Bag/Given 09/13/18 0252)  dextrose 5 %-0.45 % sodium chloride infusion (has no administration in time range)  atorvastatin (LIPITOR) tablet 20 mg (has no administration in time range)  lamoTRIgine (LAMICTAL) tablet 25 mg (25 mg Oral Given 09/13/18 0253)  prochlorperazine (COMPAZINE) tablet 10 mg (has no administration in time range)  diclofenac sodium (VOLTAREN) 1 % transdermal gel 2 g (has no administration in time range)  FLUoxetine (PROZAC) capsule 20 mg (has no administration in time range)  traZODone (DESYREL) tablet 300 mg (has no administration in time range)  propranolol (INDERAL) tablet 20 mg (20 mg Oral Given 09/13/18 0256)  topiramate (TOPAMAX) tablet 25 mg (has no administration in time range)  0.9 %  sodium chloride infusion (has no administration in time range)  0.9 %  sodium chloride infusion (has no administration in time range)  dextrose 5 %-0.45 % sodium chloride infusion (has no administration in time range)  enoxaparin (LOVENOX) injection 40 mg (has no administration in time range)  sodium chloride 0.9 % bolus 1,000 mL (1,000 mLs Intravenous New Bag/Given 09/12/18 2319)  promethazine (PHENERGAN) injection 25 mg (25 mg Intravenous Given 09/13/18 0256)     Initial Impression / Assessment and Plan / ED Course  I have reviewed the triage vital signs and the nursing notes.  Pertinent labs & imaging results that were available during my care of the patient were reviewed by me and considered in my medical decision making (see chart for details).        18 year old female with a history of CHF, diabetes mellitus type 2, and seizure-like activity who presents  to the emergency department with a chief complaint of vomiting and generalized abdominal pain for the last 2 days.  She also notes that her stool has been black in color as well as some black discoloration of vomiting.  She has not had any episodes of vomiting witnessed since arrival in the ER.  Tachycardic in the 140s on arrival, but vital signs are otherwise unremarkable.  She does have a history of sinus tachycardia, but this is much higher than her baseline of around 90-low 100s.   Hemoccult is negative.  She appears to be in DKA with a glucose of 412, bicarb of 20, and anion gap 16.  She was started on DKA order set.  Interestingly enough, pH on ABG is 7.5?  Discussed with Dr. Melanee Left, attending physician.  Perhaps thought to be secondary to hyperventilating.  She also has a new AKI with  creatinine 1.22 up from 0.77 in the ER two days ago.   EKG demonstrating sinus tachycardia on EKG is unchanged from previous.  Given tachycardia, considered PE, but she had a negative PE study on September 4.  Could be secondary to mild DKA.  TSH was checked to evaluate for hyperthyroidism, and was normal.  Free T3 and T4 are pending.  She is afebrile and has no infectious symptoms.  Hemoccult was negative the tachycardia is not due to GI bleed.   Patient also reports worsening suicidal thoughts with a plan to overdose on NSAIDs.  She will likely require TTS consult for psychiatric evaluation, but behavioral health may want to wait until the patient is medically cleared.  Consult the hospitalist team and Dr. Alcario Drought will admit for further work-up and evaluation. The patient appears reasonably stabilized for admission considering the current resources, flow, and capabilities available in the ED at this time, and I doubt any other [facility] requiring further screening and/or treatment in the ED prior to admission.  Final Clinical Impressions(s) / ED Diagnoses   Final diagnoses:  Diabetic ketoacidosis without coma associated  with type 2 diabetes mellitus ([place])  AKI (acute kidney injury) ([place])  Suicidal ideation  Tachycardia    ED Discharge Orders    None       Joanne Gavel, PA-C 09/13/18 0317    Joline Maxcy A, PA-C 09/13/18 0319    Ezequiel Essex, MD 09/13/18 [phone]

## 2018-09-13 ENCOUNTER — Emergency Department (HOSPITAL_COMMUNITY): Payer: Medicaid Other

## 2018-09-13 ENCOUNTER — Other Ambulatory Visit: Payer: Self-pay

## 2018-09-13 DIAGNOSIS — E1010 Type 1 diabetes mellitus with ketoacidosis without coma: Secondary | ICD-10-CM

## 2018-09-13 DIAGNOSIS — Z9119 Patient's noncompliance with other medical treatment and regimen: Secondary | ICD-10-CM

## 2018-09-13 DIAGNOSIS — R000 Tachycardia, unspecified: Secondary | ICD-10-CM

## 2018-09-13 DIAGNOSIS — Z833 Family history of diabetes mellitus: Secondary | ICD-10-CM | POA: Diagnosis not present

## 2018-09-13 DIAGNOSIS — F332 Major depressive disorder, recurrent severe without psychotic features: Secondary | ICD-10-CM

## 2018-09-13 DIAGNOSIS — Z794 Long term (current) use of insulin: Secondary | ICD-10-CM | POA: Diagnosis not present

## 2018-09-13 DIAGNOSIS — R45851 Suicidal ideations: Secondary | ICD-10-CM | POA: Diagnosis present

## 2018-09-13 DIAGNOSIS — Z915 Personal history of self-harm: Secondary | ICD-10-CM | POA: Diagnosis not present

## 2018-09-13 DIAGNOSIS — Z818 Family history of other mental and behavioral disorders: Secondary | ICD-10-CM | POA: Diagnosis not present

## 2018-09-13 DIAGNOSIS — R109 Unspecified abdominal pain: Secondary | ICD-10-CM

## 2018-09-13 DIAGNOSIS — F419 Anxiety disorder, unspecified: Secondary | ICD-10-CM | POA: Diagnosis present

## 2018-09-13 DIAGNOSIS — G8929 Other chronic pain: Secondary | ICD-10-CM | POA: Diagnosis present

## 2018-09-13 DIAGNOSIS — N179 Acute kidney failure, unspecified: Secondary | ICD-10-CM

## 2018-09-13 DIAGNOSIS — K5900 Constipation, unspecified: Secondary | ICD-10-CM | POA: Diagnosis present

## 2018-09-13 DIAGNOSIS — F445 Conversion disorder with seizures or convulsions: Secondary | ICD-10-CM | POA: Diagnosis present

## 2018-09-13 DIAGNOSIS — Z79899 Other long term (current) drug therapy: Secondary | ICD-10-CM | POA: Diagnosis not present

## 2018-09-13 DIAGNOSIS — E1110 Type 2 diabetes mellitus with ketoacidosis without coma: Secondary | ICD-10-CM | POA: Diagnosis not present

## 2018-09-13 DIAGNOSIS — K921 Melena: Secondary | ICD-10-CM | POA: Diagnosis present

## 2018-09-13 DIAGNOSIS — Z20828 Contact with and (suspected) exposure to other viral communicable diseases: Secondary | ICD-10-CM | POA: Diagnosis present

## 2018-09-13 HISTORY — DX: Type 1 diabetes mellitus with ketoacidosis without coma: E10.10

## 2018-09-13 LAB — RAPID URINE DRUG SCREEN, HOSP PERFORMED
Amphetamines: NOT DETECTED
Barbiturates: NOT DETECTED
Benzodiazepines: NOT DETECTED
Cocaine: NOT DETECTED
Opiates: NOT DETECTED
Tetrahydrocannabinol: NOT DETECTED

## 2018-09-13 LAB — TSH: TSH: 1.06 u[IU]/mL (ref 0.350–4.500)

## 2018-09-13 LAB — BASIC METABOLIC PANEL
Anion gap: 14 (ref 5–15)
Anion gap: 11 (ref 5–15)
Anion gap: 9 (ref 5–15)
Anion gap: 8 (ref 5–15)
BUN: 16 mg/dL (ref 6–20)
BUN: 14 mg/dL (ref 6–20)
BUN: 12 mg/dL (ref 6–20)
BUN: 13 mg/dL (ref 6–20)
CO2: 24 mmol/L (ref 22–32)
CO2: 23 mmol/L (ref 22–32)
CO2: 23 mmol/L (ref 22–32)
CO2: 23 mmol/L (ref 22–32)
Calcium: 9.4 mg/dL (ref 8.9–10.3)
Calcium: 8.2 mg/dL — ABNORMAL LOW (ref 8.9–10.3)
Calcium: 8.3 mg/dL — ABNORMAL LOW (ref 8.9–10.3)
Calcium: 8.5 mg/dL — ABNORMAL LOW (ref 8.9–10.3)
Chloride: 98 mmol/L (ref 98–111)
Chloride: 103 mmol/L (ref 98–111)
Chloride: 105 mmol/L (ref 98–111)
Chloride: 103 mmol/L (ref 98–111)
Creatinine, Ser: 1.2 mg/dL — ABNORMAL HIGH (ref 0.44–1.00)
Creatinine, Ser: 1 mg/dL (ref 0.44–1.00)
Creatinine, Ser: 0.92 mg/dL (ref 0.44–1.00)
Creatinine, Ser: 1.07 mg/dL — ABNORMAL HIGH (ref 0.44–1.00)
GFR calc Af Amer: >60 mL/min (ref >60)
GFR calc Af Amer: >60 mL/min (ref >60)
GFR calc Af Amer: >60 mL/min (ref >60)
GFR calc Af Amer: >60 mL/min (ref >60)
GFR calc non Af Amer: >60 mL/min (ref >60)
GFR calc non Af Amer: >60 mL/min (ref >60)
GFR calc non Af Amer: >60 mL/min (ref >60)
GFR calc non Af Amer: >60 mL/min (ref >60)
Glucose, Bld: 281 mg/dL — ABNORMAL HIGH (ref 70–99)
Glucose, Bld: 135 mg/dL — ABNORMAL HIGH (ref 70–99)
Glucose, Bld: 121 mg/dL — ABNORMAL HIGH (ref 70–99)
Glucose, Bld: 265 mg/dL — ABNORMAL HIGH (ref 70–99)
Potassium: 4.5 mmol/L (ref 3.5–5.1)
Potassium: 3.7 mmol/L (ref 3.5–5.1)
Potassium: 3.9 mmol/L (ref 3.5–5.1)
Potassium: 4 mmol/L (ref 3.5–5.1)
Sodium: 136 mmol/L (ref 135–145)
Sodium: 137 mmol/L (ref 135–145)
Sodium: 137 mmol/L (ref 135–145)
Sodium: 134 mmol/L — ABNORMAL LOW (ref 135–145)

## 2018-09-13 LAB — GLUCOSE, CAPILLARY
Glucose-Capillary: 142 mg/dL — ABNORMAL HIGH (ref 70–99)
Glucose-Capillary: 138 mg/dL — ABNORMAL HIGH (ref 70–99)
Glucose-Capillary: 203 mg/dL — ABNORMAL HIGH (ref 70–99)
Glucose-Capillary: 190 mg/dL — ABNORMAL HIGH (ref 70–99)
Glucose-Capillary: 151 mg/dL — ABNORMAL HIGH (ref 70–99)
Glucose-Capillary: 205 mg/dL — ABNORMAL HIGH (ref 70–99)
Glucose-Capillary: 196 mg/dL — ABNORMAL HIGH (ref 70–99)
Glucose-Capillary: 194 mg/dL — ABNORMAL HIGH (ref 70–99)

## 2018-09-13 LAB — URINALYSIS, ROUTINE W REFLEX MICROSCOPIC
Bilirubin Urine: NEGATIVE
Glucose, UA: >=500 mg/dL — AB
Hgb urine dipstick: NEGATIVE
Ketones, ur: 20 mg/dL — AB
Leukocytes,Ua: NEGATIVE
Nitrite: NEGATIVE
Protein, ur: NEGATIVE mg/dL
Specific Gravity, Urine: 1.03 (ref 1.005–1.030)
pH: 7 (ref 5.0–8.0)

## 2018-09-13 LAB — CBC
HCT: 38.3 % (ref 36.0–46.0)
Hemoglobin: 12.6 g/dL (ref 12.0–15.0)
MCH: 28.2 pg (ref 26.0–34.0)
MCHC: 32.9 g/dL (ref 30.0–36.0)
MCV: 85.7 fL (ref 80.0–100.0)
Platelets: 324 10*3/uL (ref 150–400)
RBC: 4.47 MIL/uL (ref 3.87–5.11)
RDW: 12.4 % (ref 11.5–15.5)
WBC: 10.8 10*3/uL — ABNORMAL HIGH (ref 4.0–10.5)
nRBC: 0 % (ref 0.0–0.2)

## 2018-09-13 LAB — CBG MONITORING, ED
Glucose-Capillary: 181 mg/dL — ABNORMAL HIGH (ref 70–99)
Glucose-Capillary: 185 mg/dL — ABNORMAL HIGH (ref 70–99)
Glucose-Capillary: 275 mg/dL — ABNORMAL HIGH (ref 70–99)
Glucose-Capillary: 324 mg/dL — ABNORMAL HIGH (ref 70–99)
Glucose-Capillary: 348 mg/dL — ABNORMAL HIGH (ref 70–99)

## 2018-09-13 LAB — POCT I-STAT 7, (LYTES, BLD GAS, ICA,H+H)
Acid-Base Excess: 4 mmol/L — ABNORMAL HIGH (ref 0.0–2.0)
Bicarbonate: 26.4 mmol/L (ref 20.0–28.0)
Calcium, Ion: 1.22 mmol/L (ref 1.15–1.40)
HCT: 41 % (ref 36.0–46.0)
Hemoglobin: 13.9 g/dL (ref 12.0–15.0)
O2 Saturation: 99 %
Patient temperature: 98.7
Potassium: 4.6 mmol/L (ref 3.5–5.1)
Sodium: 132 mmol/L — ABNORMAL LOW (ref 135–145)
TCO2: 27 mmol/L (ref 22–32)
pCO2 arterial: 33 mmHg (ref 32.0–48.0)
pH, Arterial: 7.511 — ABNORMAL HIGH (ref 7.350–7.450)
pO2, Arterial: 116 mmHg — ABNORMAL HIGH (ref 83.0–108.0)

## 2018-09-13 LAB — LACTIC ACID, PLASMA: Lactic Acid, Venous: 1.5 mmol/L (ref 0.5–1.9)

## 2018-09-13 LAB — ABO/RH: ABO/RH(D): A POS

## 2018-09-13 LAB — TROPONIN I (HIGH SENSITIVITY)
Troponin I (High Sensitivity): 2 ng/L (ref <18)
Troponin I (High Sensitivity): <2 ng/L (ref <18)

## 2018-09-13 LAB — MRSA PCR SCREENING: MRSA by PCR: NEGATIVE

## 2018-09-13 LAB — BRAIN NATRIURETIC PEPTIDE: B Natriuretic Peptide: 13.8 pg/mL (ref 0.0–100.0)

## 2018-09-13 LAB — LIPASE, BLOOD: Lipase: 32 U/L (ref 11–51)

## 2018-09-13 LAB — PREGNANCY, URINE: Preg Test, Ur: NEGATIVE

## 2018-09-13 LAB — T4, FREE: Free T4: 0.98 ng/dL (ref 0.61–1.12)

## 2018-09-13 LAB — SARS CORONAVIRUS 2 (TAT 6-24 HRS): SARS Coronavirus 2: NEGATIVE

## 2018-09-13 MED ORDER — INSULIN GLARGINE 100 UNIT/ML ~~LOC~~ SOLN
48.0000 [IU] | Freq: Every day | SUBCUTANEOUS | Status: DC
  Administered 2018-09-14: 10:00:00 48 [IU] via SUBCUTANEOUS
  Filled 2018-09-13 (×2): qty 0.48

## 2018-09-13 MED ORDER — FLUOXETINE HCL 20 MG PO CAPS
20.0000 mg | ORAL_CAPSULE | Freq: Every day | ORAL | Status: DC
  Administered 2018-09-13 – 2018-09-14 (×2): 20 mg via ORAL
  Filled 2018-09-13 (×3): qty 1

## 2018-09-13 MED ORDER — ENOXAPARIN SODIUM 40 MG/0.4ML ~~LOC~~ SOLN
40.0000 mg | SUBCUTANEOUS | Status: DC
  Administered 2018-09-13 – 2018-09-15 (×3): 40 mg via SUBCUTANEOUS
  Filled 2018-09-13 (×4): qty 0.4

## 2018-09-13 MED ORDER — ATORVASTATIN CALCIUM 10 MG PO TABS
20.0000 mg | ORAL_TABLET | Freq: Every day | ORAL | Status: DC
  Administered 2018-09-13 – 2018-09-15 (×3): 20 mg via ORAL
  Filled 2018-09-13 (×3): qty 2

## 2018-09-13 MED ORDER — ESCITALOPRAM OXALATE 10 MG PO TABS
20.0000 mg | ORAL_TABLET | Freq: Every day | ORAL | Status: DC
  Administered 2018-09-13 – 2018-09-15 (×3): 20 mg via ORAL
  Filled 2018-09-13 (×3): qty 2

## 2018-09-13 MED ORDER — LAMOTRIGINE 25 MG PO TABS
25.0000 mg | ORAL_TABLET | Freq: Two times a day (BID) | ORAL | Status: DC
  Administered 2018-09-13 – 2018-09-15 (×6): 25 mg via ORAL
  Filled 2018-09-13 (×7): qty 1

## 2018-09-13 MED ORDER — INSULIN ASPART 100 UNIT/ML ~~LOC~~ SOLN
0.0000 [IU] | Freq: Every day | SUBCUTANEOUS | Status: DC
  Administered 2018-09-14: 4 [IU] via SUBCUTANEOUS

## 2018-09-13 MED ORDER — INSULIN GLARGINE 100 UNIT/ML ~~LOC~~ SOLN
30.0000 [IU] | Freq: Every day | SUBCUTANEOUS | Status: DC
  Administered 2018-09-13: 11:00:00 30 [IU] via SUBCUTANEOUS
  Filled 2018-09-13: qty 0.3

## 2018-09-13 MED ORDER — DICLOFENAC SODIUM 1 % TD GEL
2.0000 g | Freq: Three times a day (TID) | TRANSDERMAL | Status: DC | PRN
  Administered 2018-09-13: 2 g via TOPICAL
  Filled 2018-09-13: qty 100

## 2018-09-13 MED ORDER — TOPIRAMATE 25 MG PO TABS
25.0000 mg | ORAL_TABLET | Freq: Two times a day (BID) | ORAL | Status: DC
  Administered 2018-09-13 – 2018-09-15 (×6): 25 mg via ORAL
  Filled 2018-09-13 (×6): qty 1

## 2018-09-13 MED ORDER — ADULT MULTIVITAMIN W/MINERALS CH
1.0000 | ORAL_TABLET | Freq: Every day | ORAL | Status: DC
  Administered 2018-09-13 – 2018-09-15 (×3): 1 via ORAL
  Filled 2018-09-13 (×3): qty 1

## 2018-09-13 MED ORDER — SODIUM CHLORIDE 0.9 % IV SOLN
INTRAVENOUS | Status: AC
  Administered 2018-09-13: 04:00:00 via INTRAVENOUS

## 2018-09-13 MED ORDER — INSULIN ASPART 100 UNIT/ML ~~LOC~~ SOLN
0.0000 [IU] | Freq: Three times a day (TID) | SUBCUTANEOUS | Status: DC
  Administered 2018-09-13: 13:00:00 5 [IU] via SUBCUTANEOUS
  Administered 2018-09-13: 3 [IU] via SUBCUTANEOUS
  Administered 2018-09-14: 5 [IU] via SUBCUTANEOUS
  Administered 2018-09-14: 2 [IU] via SUBCUTANEOUS
  Administered 2018-09-15: 8 [IU] via SUBCUTANEOUS
  Administered 2018-09-15: 2 [IU] via SUBCUTANEOUS

## 2018-09-13 MED ORDER — PROPRANOLOL HCL 20 MG PO TABS
20.0000 mg | ORAL_TABLET | Freq: Two times a day (BID) | ORAL | Status: DC
  Administered 2018-09-13 – 2018-09-15 (×6): 20 mg via ORAL
  Filled 2018-09-13 (×8): qty 1

## 2018-09-13 MED ORDER — TRAZODONE HCL 150 MG PO TABS
300.0000 mg | ORAL_TABLET | Freq: Every day | ORAL | Status: DC
  Administered 2018-09-13 – 2018-09-14 (×3): 300 mg via ORAL
  Filled 2018-09-13: qty 6
  Filled 2018-09-13 (×2): qty 2

## 2018-09-13 MED ORDER — PROCHLORPERAZINE MALEATE 10 MG PO TABS
10.0000 mg | ORAL_TABLET | Freq: Two times a day (BID) | ORAL | Status: DC | PRN
  Filled 2018-09-13: qty 1

## 2018-09-13 MED ORDER — PROMETHAZINE HCL 25 MG/ML IJ SOLN
25.0000 mg | Freq: Once | INTRAMUSCULAR | Status: AC
  Administered 2018-09-13: 25 mg via INTRAVENOUS
  Filled 2018-09-13: qty 1

## 2018-09-13 MED ORDER — ENSURE MAX PROTEIN PO LIQD
11.0000 [oz_av] | Freq: Two times a day (BID) | ORAL | Status: DC
  Administered 2018-09-13 – 2018-09-14 (×3): 11 [oz_av] via ORAL
  Filled 2018-09-13 (×7): qty 330

## 2018-09-13 MED ORDER — ACETAMINOPHEN 325 MG PO TABS
650.0000 mg | ORAL_TABLET | Freq: Four times a day (QID) | ORAL | Status: DC | PRN
  Administered 2018-09-13 – 2018-09-14 (×2): 650 mg via ORAL
  Filled 2018-09-13 (×2): qty 2

## 2018-09-13 MED ORDER — DEXTROSE-NACL 5-0.45 % IV SOLN
INTRAVENOUS | Status: DC
  Administered 2018-09-13: 04:00:00 via INTRAVENOUS

## 2018-09-13 MED ORDER — TRAMADOL HCL 50 MG PO TABS
50.0000 mg | ORAL_TABLET | Freq: Four times a day (QID) | ORAL | Status: DC | PRN
  Administered 2018-09-14 (×2): 50 mg via ORAL
  Filled 2018-09-13 (×2): qty 1

## 2018-09-13 MED ORDER — INSULIN ASPART 100 UNIT/ML ~~LOC~~ SOLN
5.0000 [IU] | Freq: Three times a day (TID) | SUBCUTANEOUS | Status: DC
  Administered 2018-09-13 – 2018-09-15 (×7): 5 [IU] via SUBCUTANEOUS

## 2018-09-13 MED ORDER — SODIUM CHLORIDE 0.9 % IV SOLN
INTRAVENOUS | Status: DC
  Administered 2018-09-13 (×2): via INTRAVENOUS

## 2018-09-13 MED ORDER — QUETIAPINE FUMARATE 100 MG PO TABS
400.0000 mg | ORAL_TABLET | Freq: Every day | ORAL | Status: DC
  Administered 2018-09-13 – 2018-09-14 (×2): 400 mg via ORAL
  Filled 2018-09-13 (×2): qty 4

## 2018-09-13 NOTE — ED Notes (Signed)
ED TO INPATIENT HANDOFF REPORT  ED Nurse Name and Phone #: [phone]  S Name/Age/Gender [name] 18 y.o. female Room/Bed: 017C/017C  Code Status   Code Status: Full Code  Home/SNF/Other Home Patient oriented to: self, place, time and situation Is this baseline? Yes   Triage Complete: Triage complete  Chief Complaint Abd Pain; Hyperglycemia; Possible GI Bleed  Triage Note Pt comes via [facility] for generalized abd pain that has been going on for several days with blood in her emesis and stool, pt also concerned with recent increase in seizure activity. Pt also hyperglycemia, seen her recently for the same, PTA received  zofran. Pt also endorses SI, plan to take a lot of ibuprofen, denies HI/AVH    Allergies Allergies  Allergen Reactions  . Ibuprofen Other (See Comments)    GI MD said to not take this anymore    Level of Care/Admitting Diagnosis ED Disposition    ED Disposition Condition Comment   Admit  Hospital Area: [facility] [[id number]]  Level of Care: Progressive [102]  Covid Evaluation: Asymptomatic Screening Protocol (No Symptoms)  Diagnosis: DKA, type [facility]) [604540]  Admitting Physician: Wyvonnia Dusky  Attending Physician: Hillary Bow [phone]  Estimated length of stay: past midnight tomorrow  Certification:: I certify this patient will need inpatient services for at least 2 midnights  PT Class (Do Not Modify): Inpatient [101]  PT Acc Code (Do Not Modify): Private [1]       B Medical/Surgery History Past Medical History:  Diagnosis Date  . Acanthosis nigricans   . Anxiety   . CHF (congestive heart failure) (HCC)   . Chronic lower back pain   . Depression   . Dyspepsia   . Obesity   . Ovarian cyst    pt is not aware of this hx (11/24/2017)  . Pre-diabetes   . 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 Premature baby   . Seizures (HCC)   . Type II diabetes mellitus (HCC)    insulin dependant   Past Surgical History:   Procedure Laterality Date  . ABDOMINAL HERNIA REPAIR     "I was a baby"  . HERNIA REPAIR    . TONSILLECTOMY AND ADENOIDECTOMY    . WISDOM TOOTH EXTRACTION  2017     A IV Location/Drains/Wounds Patient Lines/Drains/Airways Status   Active Line/Drains/Airways    Name:   Placement date:   Placement time:   Site:   Days:   Peripheral IV 09/12/18 Right Antecubital   09/12/18    2226    Antecubital   1   Peripheral IV 09/13/18 Right Hand   09/13/18    0220    Hand   less than 1          Intake/Output Last 24 hours  Intake/Output Summary (Last 24 hours) at 09/13/2018 0456 Last data filed at 09/13/2018 0231 Gross per 24 hour  Intake 100 ml  Output -  Net 100 ml    Labs/Imaging Results for orders placed or performed during the hospital encounter of 09/12/18 (from the past 48 hour(s))  Comprehensive metabolic panel     Status: Abnormal   Collection Time: 09/12/18 10:44 PM  Result Value Ref Range   Sodium 134 (L) 135 - 145 mmol/L    Comment: POST-ULTRACENTRIFUGATION LIPEMIC SPECIMEN    Potassium 4.7 3.5 - 5.1 mmol/L    Comment: SLIGHT HEMOLYSIS   Chloride 98 98 - 111 mmol/L   CO2 20 (L) 22 -  32 mmol/L   Glucose, Bld 412 (H) 70 - 99 mg/dL   BUN 16 6 - 20 mg/dL   Creatinine, Ser 1.611.22 (H) 0.44 - 1.00 mg/dL   Calcium 9.0 8.9 - 09.610.3 mg/dL   Total Protein 6.8 6.5 - 8.1 g/dL   Albumin 3.6 3.5 - 5.0 g/dL   AST 18 15 - 41 U/L   ALT 14 0 - 44 U/L   Alkaline Phosphatase 94 38 - 126 U/L   Total Bilirubin 0.7 0.3 - 1.2 mg/dL   GFR calc non Af Amer >60 >60 mL/min   GFR calc Af Amer >60 >60 mL/min   Anion gap 16 (H) 5 - 15    Comment: Performed at [facility], [address] [place], [place] [address]  CBC     Status: Abnormal   Collection Time: 09/12/18 10:44 PM  Result Value Ref Range   WBC 11.7 (H) 4.0 - 10.5 K/uL   RBC 4.70 3.87 - 5.11 MIL/uL   Hemoglobin 13.6 12.0 - 15.0 g/dL   HCT 98.140.5 19.136.0 - 47.846.0 %   MCV 86.2 80.0 - 100.0 fL   MCH 28.9 26.0 - 34.0 pg   MCHC 33.6  30.0 - 36.0 g/dL   RDW 29.512.2 62.111.5 - 30.815.5 %   Platelets 354 150 - 400 K/uL   nRBC 0.0 0.0 - 0.2 %    Comment: Performed at [facility], [address] [place], [place] [address]  Type and screen [facility]     Status: None   Collection Time: 09/12/18 10:44 PM  Result Value Ref Range   ABO/RH(D) A POS    Antibody Screen NEG    Sample Expiration      09/15/2018,2359 Performed at [facility], [address] [place], [place] [address]   ABO/Rh     Status: None (Preliminary result)   Collection Time: 09/12/18 10:44 PM  Result Value Ref Range   ABO/RH(D)      A POS Performed at [facility], [address] [place], [place] [address]   Brain natriuretic peptide     Status: None   Collection Time: 09/12/18 10:44 PM  Result Value Ref Range   B Natriuretic Peptide 13.8 0.0 - 100.0 pg/mL    Comment: Performed at [facility], [address] [place], [place] [address]  I-Stat beta hCG blood, ED     Status: None   Collection Time: 09/12/18 10:51 PM  Result Value Ref Range   I-stat hCG, quantitative <5.0 <5 mIU/mL   Comment 3            Comment:   GEST. AGE      CONC.  (mIU/mL)   <=1 WEEK        5 - 50     2 WEEKS       50 - 500     3 WEEKS       100 - 10,000     4 WEEKS     1,000 - 30,000        FEMALE AND NON-PREGNANT FEMALE:     LESS THAN 5 mIU/mL   Ethanol     Status: None   Collection Time: 09/12/18 10:57 PM  Result Value Ref Range   Alcohol, Ethyl (B) <10 <10 mg/dL    Comment: (NOTE) Lowest detectable limit for serum alcohol is 10 mg/dL. For medical purposes only. Performed at [facility], [address] [place], [place] [address]   Salicylate level     Status: None   Collection Time: 09/12/18 10:57 PM  Result Value Ref Range   Salicylate Lvl <7.0 2.8 - 30.0 mg/dL    Comment: Performed at [facility], [address] [place], [place] [address]  Acetaminophen level     Status: Abnormal   Collection Time:  09/12/18 10:57 PM  Result Value Ref Range   Acetaminophen (Tylenol), Serum <10 (L) 10 - 30 ug/mL    Comment: (NOTE) Therapeutic concentrations vary significantly. A range of 10-30 ug/mL  may be an effective concentration for many patients. However, some  are best treated at concentrations outside of this range. Acetaminophen concentrations >150 ug/mL at 4 hours after ingestion  and >50 ug/mL at 12 hours after ingestion are often associated with  toxic reactions. Performed at [facility], [address] [place], [place] [address]   POC occult blood, ED     Status: None   Collection Time: 09/12/18 11:42 PM  Result Value Ref Range   Fecal Occult Bld NEGATIVE NEGATIVE  CBG monitoring, ED     Status: Abnormal   Collection Time: 09/13/18 12:29 AM  Result Value Ref Range   Glucose-Capillary 348 (H) 70 - 99 mg/dL  I-STAT 7, (LYTES, BLD GAS, ICA, H+H)     Status: Abnormal   Collection Time: 09/13/18 12:37 AM  Result Value Ref Range   pH, Arterial 7.511 (H) 7.350 - 7.450   pCO2 arterial 33.0 32.0 - 48.0 mmHg   pO2, Arterial 116.0 (H) 83.0 - 108.0 mmHg   Bicarbonate 26.4 20.0 - 28.0 mmol/L   TCO2 27 22 - 32 mmol/L   O2 Saturation 99.0 %   Acid-Base Excess 4.0 (H) 0.0 - 2.0 mmol/L   Sodium 132 (L) 135 - 145 mmol/L   Potassium 4.6 3.5 - 5.1 mmol/L   Calcium, Ion 1.22 1.15 - 1.40 mmol/L   HCT 41.0 36.0 - 46.0 %   Hemoglobin 13.9 12.0 - 15.0 g/dL   Patient temperature 56.3 F    Collection site RADIAL, ALLEN'S TEST ACCEPTABLE    Drawn by Operator    Sample type ARTERIAL   Rapid urine drug screen (hospital performed)     Status: None   Collection Time: 09/13/18 12:49 AM  Result Value Ref Range   Opiates NONE DETECTED NONE DETECTED   Cocaine NONE DETECTED NONE DETECTED   Benzodiazepines NONE DETECTED NONE DETECTED   Amphetamines NONE DETECTED NONE DETECTED   Tetrahydrocannabinol NONE DETECTED NONE DETECTED   Barbiturates NONE DETECTED NONE DETECTED    Comment: (NOTE) DRUG  SCREEN FOR MEDICAL PURPOSES ONLY.  IF CONFIRMATION IS NEEDED FOR ANY PURPOSE, NOTIFY LAB WITHIN 5 DAYS. LOWEST DETECTABLE LIMITS FOR URINE DRUG SCREEN Drug Class                     Cutoff (ng/mL) Amphetamine and metabolites    1000 Barbiturate and metabolites    200 Benzodiazepine                 200 Tricyclics and metabolites     300 Opiates and metabolites        300 Cocaine and metabolites        300 THC                            50 Performed at [facility], [address] [place], [place] [address]   Urinalysis, Routine w reflex microscopic     Status: Abnormal   Collection Time: 09/13/18 12:49 AM  Result Value Ref Range   Color, Urine YELLOW YELLOW   APPearance HAZY (A) CLEAR   Specific Gravity, Urine 1.030 1.005 - 1.030   pH 7.0 5.0 - 8.0   Glucose, UA >=500 (A) NEGATIVE mg/dL   Hgb urine dipstick NEGATIVE NEGATIVE   Bilirubin Urine NEGATIVE NEGATIVE   Ketones, ur 20 (A) NEGATIVE mg/dL   Protein, ur NEGATIVE NEGATIVE mg/dL   Nitrite NEGATIVE NEGATIVE   Leukocytes,Ua NEGATIVE NEGATIVE   RBC / HPF 6-10 0 - 5 RBC/hpf   WBC, UA 0-5 0 - 5 WBC/hpf   Bacteria, UA FEW (A) NONE SEEN   Squamous Epithelial / LPF 0-5 0 - 5   Budding Yeast PRESENT     Comment: Performed at [facility], [address] [place], [place] [address]  Pregnancy, urine     Status: None   Collection Time: 09/13/18 12:58 AM  Result Value Ref Range   Preg Test, Ur NEGATIVE NEGATIVE    Comment:        THE SENSITIVITY OF THIS METHODOLOGY IS >20 mIU/mL. Performed at [facility], [address] [place], [place] [address]   Lactic acid, plasma     Status: None   Collection Time: 09/13/18  1:08 AM  Result Value Ref Range   Lactic Acid, Venous 1.5 0.5 - 1.9 mmol/L    Comment: Performed at [facility], [address] [place], [place] [address]  TSH     Status: None   Collection Time: 09/13/18  1:08 AM  Result Value Ref Range   TSH 1.060 0.350 - 4.500 uIU/mL    Comment:  Performed by a 3rd Generation assay with a functional sensitivity of <=0.01 uIU/mL. Performed at [facility], [address] [place], [place] [address]   T4, free     Status: None   Collection Time: 09/13/18  1:08 AM  Result Value Ref Range   Free T4 0.98 0.61 - 1.12 ng/dL    Comment: (NOTE) Biotin ingestion may interfere with free T4 tests. If the results are inconsistent with the TSH level, previous test results, or the clinical presentation, then consider biotin interference. If needed, order repeat testing after stopping biotin. Performed at [facility], [address] [place], [place] [address]   CBG monitoring, ED     Status: Abnormal   Collection Time: 09/13/18  1:23 AM  Result Value Ref Range   Glucose-Capillary 324 (H) 70 - 99 mg/dL  CBG monitoring, ED     Status: Abnormal   Collection Time: 09/13/18  2:22 AM  Result Value Ref Range   Glucose-Capillary 275 (H) 70 - 99 mg/dL  Troponin I (High Sensitivity)     Status: None   Collection Time: 09/13/18  2:27 AM  Result Value Ref Range   Troponin I (High Sensitivity) 2 <18 ng/L    Comment: (NOTE) Elevated high sensitivity troponin I (hsTnI) values and significant  changes across serial measurements may suggest ACS but many other  chronic and acute conditions are known to elevate hsTnI results.  Refer to the "Links" section for chest pain algorithms and additional  guidance. Performed at [facility], [address] [place], [place] [address]   Lipase, blood     Status: None   Collection Time: 09/13/18  2:27 AM  Result Value Ref Range   Lipase 32 11 - 51 U/L    Comment: Performed at [place] [address], [place], [place] [address]  Basic metabolic panel     Status: Abnormal   Collection Time: 09/13/18  2:27 AM  Result Value Ref Range   Sodium 136 135 - 145 mmol/L   Potassium 4.5 3.5 - 5.1 mmol/L   Chloride 98 98 - 111 mmol/L   CO2 24 22 - 32 mmol/L   Glucose, Bld 281 (H) 70 - 99  mg/dL   BUN 16 6 - 20 mg/dL   Creatinine, Ser 1.20 (H) 0.44 - 1.00 mg/dL   Calcium 9.4 8.9 - 10.3 mg/dL   GFR calc non Af Amer >60 >60 mL/min   GFR calc Af Amer >60 >60 mL/min   Anion gap 14 5 - 15    Comment: Performed at [facility], [place] [address], [place], [place] [address]  CBG monitoring, ED     Status: Abnormal   Collection Time: 09/13/18  3:40 AM  Result Value Ref Range   Glucose-Capillary 181 (H) 70 - 99 mg/dL   Dg Chest 2 View  Result Date: 09/13/2018 CLINICAL DATA:  Chest pain EXAM: CHEST - 2 VIEW COMPARISON:  September 10, 2018 FINDINGS: The heart size and mediastinal contours are within normal limits. Both lungs are clear. The visualized skeletal structures are unremarkable. IMPRESSION: No acute cardiopulmonary disease. Electronically Signed   By: Prudencio Pair M.D.   On: 09/13/2018 02:16    Pending Labs Unresulted Labs (From admission, onward)    Start     Ordered   09/13/18 0500  CBC  Tomorrow morning,   R     09/13/18 0230   09/13/18 1761  Basic metabolic panel  STAT Now then every 4 hours ,   STAT     09/13/18 0228   09/13/18 0044  T3, free  ONCE - STAT,   STAT     09/13/18 0045   09/13/18 0036  SARS CORONAVIRUS 2 (TAT 6-24 HRS) Nasopharyngeal Nasopharyngeal Swab  (Asymptomatic/Tier 2 Patients Labs)  Once,   STAT    Question Answer Comment  Is this test for diagnosis or screening Screening   Symptomatic for COVID-19 as defined by CDC No   Hospitalized for COVID-19 No   Admitted to ICU for COVID-19 No   Previously tested for COVID-19 Yes   Resident in a congregate (group) care setting No   Employed in healthcare setting No   Pregnant No      09/13/18 0035          Vitals/Pain Today's Vitals   09/13/18 0228 09/13/18 0259 09/13/18 0315 09/13/18 0430  BP:   113/77 106/63  Pulse:   (!) 134 (!) 102  Resp:   (!) 26 (!) 23  Temp: 98.7 F (37.1 C)     TempSrc: Oral     SpO2:   100% 100%  Weight:      Height:      PainSc:  10-Worst pain ever       Isolation Precautions No active isolations  Medications Medications  insulin regular, human (MYXREDLIN) 100 units/ 100 mL infusion (3.8 Units/hr Intravenous Rate/Dose Change 09/13/18 0455)  dextrose 5 %-0.45 % sodium chloride infusion ( Intravenous Not Given 09/13/18 0353)  atorvastatin (LIPITOR) tablet 20 mg (has no administration in time range)  lamoTRIgine (LAMICTAL) tablet 25 mg (25 mg Oral Given 09/13/18 0253)  prochlorperazine (COMPAZINE) tablet 10 mg (  has no administration in time range)  diclofenac sodium (VOLTAREN) 1 % transdermal gel 2 g (has no administration in time range)  FLUoxetine (PROZAC) capsule 20 mg (has no administration in time range)  traZODone (DESYREL) tablet 300 mg (300 mg Oral Given 09/13/18 0346)  propranolol (INDERAL) tablet 20 mg (20 mg Oral Given 09/13/18 0256)  topiramate (TOPAMAX) tablet 25 mg (25 mg Oral Given 09/13/18 0335)  0.9 %  sodium chloride infusion ( Intravenous New Bag/Given 09/13/18 0337)  0.9 %  sodium chloride infusion ( Intravenous New Bag/Given 09/13/18 0338)  dextrose 5 %-0.45 % sodium chloride infusion ( Intravenous New Bag/Given 09/13/18 0351)  enoxaparin (LOVENOX) injection 40 mg (has no administration in time range)  sodium chloride 0.9 % bolus 1,000 mL (0 mLs Intravenous Stopped 09/13/18 0337)  potassium chloride 10 mEq in 100 mL IVPB (0 mEq Intravenous Stopped 09/13/18 0349)  promethazine (PHENERGAN) injection 25 mg (25 mg Intravenous Given 09/13/18 0256)    Mobility walks Low fall risk   Focused Assessments Cardiac Assessment Handoff:  Cardiac Rhythm: Sinus tachycardia Lab Results  Component Value Date   CKTOTAL 41 08/03/2018   TROPONINI <0.03 06/20/2018   Lab Results  Component Value Date   DDIMER <0.27 06/14/2018   Does the Patient currently have chest pain? Yes     R Recommendations: See Admitting Provider Note  Report given to:   Additional Notes:

## 2018-09-13 NOTE — Progress Notes (Signed)
[facility] MD notified about pt's request for home dose seroquel. MD stated she would put orders in.

## 2018-09-13 NOTE — Progress Notes (Addendum)
  Patient is a 18 year old female with history of insulin-dependent diabetes type 1, psychogenic nonepileptic seizures, chronic abdominal pain, somatoform disorder, anxiety/depression who presents with several complaints.  She was complaining of abdomen pain, chest pain, black tarry stools, shortness of breath on prsentation  She also reported of having suicidal thoughts for the past several months.  On presentation she was found to be tachycardic.  Lab work showed elevated anion gap, ketones in urine.  She was admitted for the management of  DKA.  Started on insulin drip. Currently her gap has closed.  She has been started on diet and long-acting insulin.  Diabetic coordinator  following.   Patient reported worsening depressive symptoms after her father passed away.  She reports attempting suicide several times.  We requested for psychiatric consultation for suicidal thoughts. Currently she is hemodynamically stable.  Patient seen and examined the bedside this afternoon.She looked very comfortable and was eager to eat food.  Psychiatric consultation done. She takes Prozac 20 mg daily, Lexapro 20 mg daily , Trazodone 300 mg qhs, Seroquel 400 mg qhs and Lamictal 25 mg BID for seizures.  Psychiatry recommended to start her home meds: Lexapro and Seroquel.  Psychiatry has recommended inpatient inpatient psychiatric hospitalization.  Continue Engineer, materials.  I have requested social worker consult.She might be medically stable for discharge to inpatient psych tomorrow. Patient seen by Dr. Alcario Drought this morning.

## 2018-09-13 NOTE — Consult Note (Signed)
Telepsych Consultation   Reason for Consult:  SI Referring Physician: Dr. Burnadette PopAmrit Adhikari Location of Patient: [facility] Location of Provider: [facility]  Patient Identification: Nancy [name] MRN:  [id number] Principal Diagnosis: MDD (major depressive disorder), recurrent severe, without psychosis (HCC) Diagnosis:  Principal Problem:   DKA, type 1 (HCC) Active Problems:   Non compliance with medical treatment   Sinus tachycardia by electrocardiogram   Chronic abdominal pain   Suicidal ideations   Total Time spent with patient: 1 hour  Subjective:   Nancy [name] is a 18 y.o. female patient admitted with DKA.  HPI:   Per chart review, patient was admitted with DKA. She has a history of psychogenic nonepileptic seizures. Psychiatry was consulted for SI. She reports worsening depressive symptoms in the setting of ongoing health issues and academic stressors. 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 She reports significant grief over the lost of her father in 2013. He passed away in his sleep and she has a lot of guilt because he was asleep and she tried to wake him up multiple times but did not immediately seek medical attention. She has seen multiple therapists at [facility]. She has not found therapy to be beneficial. She was provided with resources by her current provider to establish care with a therapist that she can see consistently. She has limited social support. She endorses chronic and intermittent SI. She denies a plan but previously thought about taking Ibuprofen with which she is allergic. She reports attempting suicide in August by drinking rubbing alcohol. She did not seek medical attention for her suicide attempt. She denies HI or AVH. She reports poor appetite with a 100 pound weight loss over the past year. She reports insomnia with problems falling asleep and maintaining sleep. She reports generalized  worries. She reports compliance with her medications. She takes Prozac 20 mg daily, Lexapro 20 mg daily (added to augment Prozac), Trazodone 300 mg qhs, Seroquel 400 mg qhs and Lamictal 25 mg BID for seizures. She denies recent medication changes. She reports verbal consent to speak to her mother, Nancy [name] [phone]). She reports that her mother is with her 24/7 when she is home due to her history of seizures and inability to operate a motor vehicle.    Patient's mother reports concerns for her safety to harm self due to a history of recurrent suicide attempts and worsening depressive symptoms since her father passed. She is unaware of her recent suicide attempt a month ago and this was not mentioned to her mother during the telephone encounter.    Past Psychiatric History: MDD and anxiety.   Risk to Self: Yes endorses recent SI with a plan.  Risk to Others:  None. Denies HI.  Prior Inpatient Therapy:  Denies  Prior Outpatient Therapy:  [facility].   Past Medical History:  Past Medical History:  Diagnosis Date  . Acanthosis nigricans   . Anxiety   . CHF (congestive heart failure) (HCC)   . Chronic lower back pain   . Depression   . Dyspepsia   . Obesity   . Ovarian cyst    pt is not aware of this hx (11/24/2017)  . Pre-diabetes   . 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 Premature baby   . Seizures (HCC)   . Type II diabetes mellitus (HCC)    insulin dependant    Past Surgical History:  Procedure Laterality Date  . ABDOMINAL HERNIA REPAIR     "I was a baby"  . HERNIA REPAIR    . TONSILLECTOMY AND ADENOIDECTOMY    . WISDOM TOOTH EXTRACTION  2017   Family History:  Family History  Problem Relation Age of Onset  . Diabetes Mother   . Hypertension Mother   . Obesity Mother   . Asthma Mother   . Allergic rhinitis Mother   . Eczema Mother   . Diabetes Father   . Hypertension Father   . Obesity Father   . Hyperlipidemia Father   .  Hypertension Paternal Aunt   . Hypertension Maternal Grandfather   . Colon cancer Maternal Grandfather   . Diabetes Paternal Grandmother   . Obesity Paternal Grandmother   . Diabetes Paternal Grandfather   . Obesity Paternal Grandfather   . Angioedema Neg Hx   . Immunodeficiency Neg Hx   . Urticaria Neg Hx   . Stomach cancer Neg Hx   . Esophageal cancer Neg Hx    Family Psychiatric  History: Sister-depression  Social History:  Social History   Substance and Sexual Activity  Alcohol Use No  . Alcohol/week: 0.0 standard drinks     Social History   Substance and Sexual Activity  Drug Use No    Social History   Socioeconomic History  . Marital status: Single    Spouse name: Not on file  . Number of children: 0  . Years of education: Not on file  . Highest education level: Not on file  Occupational History  . Occupation: Admission  Social Needs  . Financial resource strain: Not on file  . Food insecurity    Worry: Not on file    Inability: Not on file  . Transportation needs    Medical: Not on file    Non-medical: Not on file  Tobacco Use  . Smoking status: Never Smoker  . Smokeless tobacco: Never Used  Substance and Sexual Activity  . Alcohol use: No    Alcohol/week: 0.0 standard drinks  . Drug use: No  . Sexual activity: Never  Lifestyle  . Physical activity    Days per week: Not on file    Minutes per session: Not on file  . Stress: Not on file  Relationships  . Social Musician on phone: Not on file    Gets together: Not on file    Attends religious service: Not on file    Active member of club or organization: Not on file    Attends meetings of clubs or organizations: Not on file    Relationship status: Not on file  Other Topics Concern  . Not on file  Social History Narrative   Lives with mom and mom's girlfriend. Dad involved. 9th grade [employer].   Additional Social History: She lives with her mother and her mother's  girlfriend. She is a full time Consulting civil engineer. She denies alcohol or illicit drug use.     Allergies:   Allergies  Allergen Reactions  . Ibuprofen Other (See Comments)    GI MD said to not take this anymore    Labs:  Results for orders placed or performed during the hospital encounter of 09/12/18 (from the past 48 hour(s))  Comprehensive metabolic panel     Status: Abnormal   Collection Time: 09/12/18 10:44 PM  Result Value Ref Range   Sodium 134 (L) 135 - 145 mmol/L    Comment: POST-ULTRACENTRIFUGATION LIPEMIC SPECIMEN  Potassium 4.7 3.5 - 5.1 mmol/L    Comment: SLIGHT HEMOLYSIS   Chloride 98 98 - 111 mmol/L   CO2 20 (L) 22 - 32 mmol/L   Glucose, Bld 412 (H) 70 - 99 mg/dL   BUN 16 6 - 20 mg/dL   Creatinine, Ser 1.22 (H) 0.44 - 1.00 mg/dL   Calcium 9.0 8.9 - 10.3 mg/dL   Total Protein 6.8 6.5 - 8.1 g/dL   Albumin 3.6 3.5 - 5.0 g/dL   AST 18 15 - 41 U/L   ALT 14 0 - 44 U/L   Alkaline Phosphatase 94 38 - 126 U/L   Total Bilirubin 0.7 0.3 - 1.2 mg/dL   GFR calc non Af Amer >60 >60 mL/min   GFR calc Af Amer >60 >60 mL/min   Anion gap 16 (H) 5 - 15    Comment: Performed at [facility], [address], [place], [place] [address]  CBC     Status: Abnormal   Collection Time: 09/12/18 10:44 PM  Result Value Ref Range   WBC 11.7 (H) 4.0 - 10.5 K/uL   RBC 4.70 3.87 - 5.11 MIL/uL   Hemoglobin 13.6 12.0 - 15.0 g/dL   HCT 40.5 36.0 - 46.0 %   MCV 86.2 80.0 - 100.0 fL   MCH 28.9 26.0 - 34.0 pg   MCHC 33.6 30.0 - 36.0 g/dL   RDW 12.2 11.5 - 15.5 %   Platelets 354 150 - 400 K/uL   nRBC 0.0 0.0 - 0.2 %    Comment: Performed at [facility], [place] [address] [place], [place] [address]  Type and screen [place]     Status: None   Collection Time: 09/12/18 10:44 PM  Result Value Ref Range   ABO/RH(D) A POS    Antibody Screen NEG    Sample Expiration      09/15/2018,2359 Performed at [facility], [place] [address], [place], [place] [address]    ABO/Rh     Status: None   Collection Time: 09/12/18 10:44 PM  Result Value Ref Range   ABO/RH(D)      A POS Performed at [facility] [address] [place], [place] [address]   Brain natriuretic peptide     Status: None   Collection Time: 09/12/18 10:44 PM  Result Value Ref Range   B Natriuretic Peptide 13.8 0.0 - 100.0 pg/mL    Comment: Performed at [facility] [address] [place], [place] [address]  I-Stat beta hCG blood, ED     Status: None   Collection Time: 09/12/18 10:51 PM  Result Value Ref Range   I-stat hCG, quantitative <5.0 <5 mIU/mL   Comment 3            Comment:   GEST. AGE      CONC.  (mIU/mL)   <=1 WEEK        5 - 50     2 WEEKS       50 - 500     3 WEEKS       100 - 10,000     4 WEEKS     1,000 - 30,000        FEMALE AND NON-PREGNANT FEMALE:     LESS THAN 5 mIU/mL   Ethanol     Status: None   Collection Time: 09/12/18 10:57 PM  Result Value Ref Range   Alcohol, Ethyl (B) <10 <10 mg/dL  Comment: (NOTE) Lowest detectable limit for serum alcohol is 10 mg/dL. For medical purposes only. Performed at [facility], [address] [place], [place] [address]   Salicylate level     Status: None   Collection Time: 09/12/18 10:57 PM  Result Value Ref Range   Salicylate Lvl <7.0 2.8 - 30.0 mg/dL    Comment: Performed at [facility], [address] [place], [place] [address]  Acetaminophen level     Status: Abnormal   Collection Time: 09/12/18 10:57 PM  Result Value Ref Range   Acetaminophen (Tylenol), Serum <10 (L) 10 - 30 ug/mL    Comment: (NOTE) Therapeutic concentrations vary significantly. A range of 10-30 ug/mL  may be an effective concentration for many patients. However, some  are best treated at concentrations outside of this range. Acetaminophen concentrations >150 ug/mL at 4 hours after ingestion  and >50 ug/mL at 12 hours after ingestion are often associated with  toxic reactions. Performed at [facility], [address] [place], [place] [address]   POC occult blood, ED     Status: None   Collection Time: 09/12/18 11:42 PM  Result Value Ref Range   Fecal Occult Bld NEGATIVE NEGATIVE  CBG monitoring, ED     Status: Abnormal   Collection Time: 09/13/18 12:29 AM  Result Value Ref Range   Glucose-Capillary 348 (H) 70 - 99 mg/dL  I-STAT 7, (LYTES, BLD GAS, ICA, H+H)     Status: Abnormal   Collection Time: 09/13/18 12:37 AM  Result Value Ref Range   pH, Arterial 7.511 (H) 7.350 - 7.450   pCO2 arterial 33.0 32.0 - 48.0 mmHg   pO2, Arterial 116.0 (H) 83.0 - 108.0 mmHg   Bicarbonate 26.4 20.0 - 28.0 mmol/L   TCO2 27 22 - 32 mmol/L   O2 Saturation 99.0 %   Acid-Base Excess 4.0 (H) 0.0 - 2.0 mmol/L   Sodium 132 (L) 135 - 145 mmol/L   Potassium 4.6 3.5 - 5.1 mmol/L   Calcium, Ion 1.22 1.15 - 1.40 mmol/L   HCT 41.0 36.0 - 46.0 %   Hemoglobin 13.9 12.0 - 15.0 g/dL   Patient temperature 91.498.7 F    Collection site RADIAL, ALLEN'S TEST ACCEPTABLE    Drawn by Operator    Sample type ARTERIAL   Rapid urine drug screen (hospital performed)     Status: None   Collection Time: 09/13/18 12:49 AM  Result Value Ref Range   Opiates NONE DETECTED NONE DETECTED   Cocaine NONE DETECTED NONE DETECTED   Benzodiazepines NONE DETECTED NONE DETECTED   Amphetamines NONE DETECTED NONE DETECTED   Tetrahydrocannabinol NONE DETECTED NONE DETECTED   Barbiturates NONE DETECTED NONE DETECTED    Comment: (NOTE) DRUG SCREEN FOR MEDICAL PURPOSES ONLY.  IF CONFIRMATION IS NEEDED FOR ANY PURPOSE, NOTIFY LAB WITHIN 5 DAYS. LOWEST DETECTABLE LIMITS FOR URINE DRUG SCREEN Drug Class                     Cutoff (ng/mL) Amphetamine and metabolites    1000 Barbiturate and metabolites    200 Benzodiazepine                 200 Tricyclics and metabolites     300 Opiates and metabolites        300 Cocaine and metabolites        300 THC  50 Performed at [facility], [address],  [place], [place] [address]   Urinalysis, Routine w reflex microscopic     Status: Abnormal   Collection Time: 09/13/18 12:49 AM  Result Value Ref Range   Color, Urine YELLOW YELLOW   APPearance HAZY (A) CLEAR   Specific Gravity, Urine 1.030 1.005 - 1.030   pH 7.0 5.0 - 8.0   Glucose, UA >=500 (A) NEGATIVE mg/dL   Hgb urine dipstick NEGATIVE NEGATIVE   Bilirubin Urine NEGATIVE NEGATIVE   Ketones, ur 20 (A) NEGATIVE mg/dL   Protein, ur NEGATIVE NEGATIVE mg/dL   Nitrite NEGATIVE NEGATIVE   Leukocytes,Ua NEGATIVE NEGATIVE   RBC / HPF 6-10 0 - 5 RBC/hpf   WBC, UA 0-5 0 - 5 WBC/hpf   Bacteria, UA FEW (A) NONE SEEN   Squamous Epithelial / LPF 0-5 0 - 5   Budding Yeast PRESENT     Comment: Performed at [facility], [address], [place], [place] [address]  Pregnancy, urine     Status: None   Collection Time: 09/13/18 12:58 AM  Result Value Ref Range   Preg Test, Ur NEGATIVE NEGATIVE    Comment:        THE SENSITIVITY OF THIS METHODOLOGY IS >20 mIU/mL. Performed at [facility], [address] [place], [place] [address]   Lactic acid, plasma     Status: None   Collection Time: 09/13/18  1:08 AM  Result Value Ref Range   Lactic Acid, Venous 1.5 0.5 - 1.9 mmol/L    Comment: Performed at [facility], [address], [place], [place] [address]  TSH     Status: None   Collection Time: 09/13/18  1:08 AM  Result Value Ref Range   TSH 1.060 0.350 - 4.500 uIU/mL    Comment: Performed by a 3rd Generation assay with a functional sensitivity of <=0.01 uIU/mL. Performed at [facility], [address] [place], [place] [address]   T4, free     Status: None   Collection Time: 09/13/18  1:08 AM  Result Value Ref Range   Free T4 0.98 0.61 - 1.12 ng/dL    Comment: (NOTE) Biotin ingestion may interfere with free T4 tests. If the results are inconsistent with the TSH level, previous test results, or the clinical presentation, then consider biotin interference. If  needed, order repeat testing after stopping biotin. Performed at [facility], [address] [place], [place] [address]   SARS CORONAVIRUS 2 (TAT 6-24 HRS) Nasopharyngeal Nasopharyngeal Swab     Status: None   Collection Time: 09/13/18  1:09 AM   Specimen: Nasopharyngeal Swab  Result Value Ref Range   SARS Coronavirus 2 NEGATIVE NEGATIVE    Comment: (NOTE) SARS-CoV-2 target nucleic acids are NOT DETECTED. The SARS-CoV-2 RNA is generally detectable in upper and lower respiratory specimens during the acute phase of infection. Negative results do not preclude SARS-CoV-2 infection, do not rule out co-infections with other pathogens, and should not be used as the sole basis for treatment or other patient management decisions. Negative results must be combined with clinical observations, patient history, and epidemiological information. The expected result is Negative. Fact Sheet for Patients: HairSlick.no Fact Sheet for Healthcare Providers: quierodirigir.com This test is not yet approved or cleared by the [place] FDA and  has been authorized for detection and/or diagnosis of SARS-CoV-2 by FDA under an Emergency Use Authorization (EUA). This EUA will remain  in effect (meaning this test can be used) for the duration of the COVID-19 declaration under Section 56 4(b)(1) of the Act, 21 U.S.C. section 360bbb-3(b)(1), unless the authorization is terminated or revoked sooner. Performed at [facility], [address], [place], [place] [address]   CBG monitoring, ED     Status: Abnormal   Collection Time: 09/13/18  1:23 AM  Result Value Ref Range   Glucose-Capillary 324 (H) 70 - 99 mg/dL  CBG monitoring, ED     Status: Abnormal   Collection Time: 09/13/18  2:22 AM  Result Value Ref Range   Glucose-Capillary 275 (H) 70 - 99 mg/dL  Troponin I (High Sensitivity)     Status: None   Collection Time: 09/13/18  2:27 AM   Result Value Ref Range   Troponin I (High Sensitivity) 2 <18 ng/L    Comment: (NOTE) Elevated high sensitivity troponin I (hsTnI) values and significant  changes across serial measurements may suggest ACS but many other  chronic and acute conditions are known to elevate hsTnI results.  Refer to the "Links" section for chest pain algorithms and additional  guidance. Performed at [facility], [address], [place], [place] [address]   Lipase, blood     Status: None   Collection Time: 09/13/18  2:27 AM  Result Value Ref Range   Lipase 32 11 - 51 U/L    Comment: Performed at [facility], [address], [place], [place] [address]  Basic metabolic panel     Status: Abnormal   Collection Time: 09/13/18  2:27 AM  Result Value Ref Range   Sodium 136 135 - 145 mmol/L   Potassium 4.5 3.5 - 5.1 mmol/L   Chloride 98 98 - 111 mmol/L   CO2 24 22 - 32 mmol/L   Glucose, Bld 281 (H) 70 - 99 mg/dL   BUN 16 6 - 20 mg/dL   Creatinine, Ser 6.57 (H) 0.44 - 1.00 mg/dL   Calcium 9.4 8.9 - 84.6 mg/dL   GFR calc non Af Amer >60 >60 mL/min   GFR calc Af Amer >60 >60 mL/min   Anion gap 14 5 - 15    Comment: Performed at [facility], [address] [place], [place] [address]  CBG monitoring, ED     Status: Abnormal   Collection Time: 09/13/18  3:40 AM  Result Value Ref Range   Glucose-Capillary 181 (H) 70 - 99 mg/dL  Troponin I (High Sensitivity)     Status: None   Collection Time: 09/13/18  4:30 AM  Result Value Ref Range   Troponin I (High Sensitivity) <2 <18 ng/L    Comment: Performed at [facility], [address] [place], [place] [address]  CBG monitoring, ED     Status: Abnormal   Collection Time: 09/13/18  4:53 AM  Result Value Ref Range   Glucose-Capillary 185 (H) 70 - 99 mg/dL  Glucose, capillary     Status: Abnormal   Collection Time: 09/13/18  5:58 AM  Result Value Ref Range   Glucose-Capillary 142 (H) 70 - 99 mg/dL  Basic metabolic panel     Status:  Abnormal   Collection Time: 09/13/18  6:28 AM  Result Value Ref Range   Sodium 137 135 - 145 mmol/L   Potassium 3.7 3.5 - 5.1 mmol/L   Chloride 103 98 - 111 mmol/L   CO2 23 22 - 32 mmol/L   Glucose, Bld 135 (H) 70 -  99 mg/dL   BUN 14 6 - 20 mg/dL   Creatinine, Ser 1.61 0.44 - 1.00 mg/dL   Calcium 8.2 (L) 8.9 - 10.3 mg/dL   GFR calc non Af Amer >60 >60 mL/min   GFR calc Af Amer >60 >60 mL/min   Anion gap 11 5 - 15    Comment: Performed at [facility], [address] [place] [address], [place], [place] [address]  CBC     Status: Abnormal   Collection Time: 09/13/18  6:28 AM  Result Value Ref Range   WBC 10.8 (H) 4.0 - 10.5 K/uL   RBC 4.47 3.87 - 5.11 MIL/uL   Hemoglobin 12.6 12.0 - 15.0 g/dL   HCT 54.0 98.1 - 19.1 %   MCV 85.7 80.0 - 100.0 fL   MCH 28.2 26.0 - 34.0 pg   MCHC 32.9 30.0 - 36.0 g/dL   RDW 47.8 29.5 - 62.1 %   Platelets 324 150 - 400 K/uL   nRBC 0.0 0.0 - 0.2 %    Comment: Performed at [facility], [address], [place], [place] [address]  Glucose, capillary     Status: Abnormal   Collection Time: 09/13/18  7:01 AM  Result Value Ref Range   Glucose-Capillary 138 (H) 70 - 99 mg/dL   Comment 1 Notify RN    Comment 2 Document in Chart   Glucose, capillary     Status: Abnormal   Collection Time: 09/13/18  8:08 AM  Result Value Ref Range   Glucose-Capillary 203 (H) 70 - 99 mg/dL  MRSA PCR Screening     Status: None   Collection Time: 09/13/18  8:28 AM   Specimen: Nasal Mucosa; Nasopharyngeal  Result Value Ref Range   MRSA by PCR NEGATIVE NEGATIVE    Comment:        The GeneXpert MRSA Assay (FDA approved for NASAL specimens only), is one component of a comprehensive MRSA colonization surveillance program. It is not intended to diagnose MRSA infection nor to guide or monitor treatment for MRSA infections. Performed at [facility], [address], [place], [place] [address]   Glucose, capillary     Status: Abnormal   Collection Time: 09/13/18  9:28 AM   Result Value Ref Range   Glucose-Capillary 190 (H) 70 - 99 mg/dL  Glucose, capillary     Status: Abnormal   Collection Time: 09/13/18 10:05 AM  Result Value Ref Range   Glucose-Capillary 151 (H) 70 - 99 mg/dL  Basic metabolic panel     Status: Abnormal   Collection Time: 09/13/18 10:51 AM  Result Value Ref Range   Sodium 137 135 - 145 mmol/L   Potassium 3.9 3.5 - 5.1 mmol/L   Chloride 105 98 - 111 mmol/L   CO2 23 22 - 32 mmol/L   Glucose, Bld 121 (H) 70 - 99 mg/dL   BUN 12 6 - 20 mg/dL   Creatinine, Ser 6.29 0.44 - 1.00 mg/dL   Calcium 8.3 (L) 8.9 - 10.3 mg/dL   GFR calc non Af Amer >60 >60 mL/min   GFR calc Af Amer >60 >60 mL/min   Anion gap 9 5 - 15    Comment: Performed at [facility], [address] [place], [place] [address]    Medications:  Current Facility-Administered Medications  Medication Dose Route Frequency Provider Last Rate Last Dose  . 0.9 %  sodium chloride infusion   Intravenous Continuous Hillary Bow, DO 125 mL/hr at 09/13/18  1024    . acetaminophen (TYLENOL) tablet 650 mg  650 mg Oral Q6H PRN Bodenheimer, Charles A, NP      . atorvastatin (LIPITOR) tablet 20 mg  20 mg Oral Daily Lyda Perone M, DO   20 mg at 09/13/18 1026  . diclofenac sodium (VOLTAREN) 1 % transdermal gel 2 g  2 g Topical TID PRN Hillary Bow, DO   2 g at 09/13/18 0523  . enoxaparin (LOVENOX) injection 40 mg  40 mg Subcutaneous Q24H Lyda Perone M, DO   40 mg at 09/13/18 1315  . FLUoxetine (PROZAC) capsule 20 mg  20 mg Oral QHS Lyda Perone M, DO   20 mg at 09/13/18 0522  . insulin aspart (novoLOG) injection 0-15 Units  0-15 Units Subcutaneous TID WC Burnadette Pop, MD   5 Units at 09/13/18 1300  . insulin aspart (novoLOG) injection 0-5 Units  0-5 Units Subcutaneous QHS Adhikari, Amrit, MD      . insulin aspart (novoLOG) injection 5 Units  5 Units Subcutaneous TID WC Burnadette Pop, MD      . Melene Muller ON 09/14/2018] insulin glargine (LANTUS) injection 48 Units  48 Units  Subcutaneous Daily Adhikari, Amrit, MD      . lamoTRIgine (LAMICTAL) tablet 25 mg  25 mg Oral BID Lyda Perone M, DO   25 mg at 09/13/18 1027  . multivitamin with minerals tablet 1 tablet  1 tablet Oral Daily Adhikari, Amrit, MD      . prochlorperazine (COMPAZINE) tablet 10 mg  10 mg Oral BID PRN Hillary Bow, DO      . propranolol (INDERAL) tablet 20 mg  20 mg Oral BID Lyda Perone M, DO   20 mg at 09/13/18 1026  . protein supplement (ENSURE MAX) liquid  11 oz Oral BID Burnadette Pop, MD      . topiramate (TOPAMAX) tablet 25 mg  25 mg Oral BID Lyda Perone M, DO   25 mg at 09/13/18 1027  . traMADol (ULTRAM) tablet 50 mg  50 mg Oral Q6H PRN Bodenheimer, Charles A, NP      . traZODone (DESYREL) tablet 300 mg  300 mg Oral QHS Lyda Perone M, DO   300 mg at 09/13/18 0346    Musculoskeletal: Strength & Muscle Tone: No atrophy noted. Gait & Station: UTA since patient is lying in bed. Patient leans: N/A  Psychiatric Specialty Exam: Physical Exam  Nursing note and vitals reviewed. Constitutional: She is oriented to person, place, and time. She appears well-developed and well-nourished.  HENT:  Head: Normocephalic and atraumatic.  Neck: Normal range of motion.  Respiratory: Effort normal.  Musculoskeletal: Normal range of motion.  Neurological: She is alert and oriented to person, place, and time.  Psychiatric: Her speech is normal and behavior is normal. Judgment and thought content normal. Cognition and memory are normal. She exhibits a depressed mood.    Review of Systems  Psychiatric/Behavioral: Positive for depression. Negative for suicidal ideas.  All other systems reviewed and are negative.   Blood pressure 106/63, pulse (!) 102, temperature 98.6 F (37 C), temperature source Oral, resp. rate 20, height 5\' 3"  (1.6 m), weight 88.3 kg, last menstrual period 08/30/2018, SpO2 100 %.Body mass index is 34.48 kg/m.  General Appearance: Fairly Groomed, young, African American  female, wearing a hospital gown with short hair who is sitting in bed. NAD.   Eye Contact:  Fair  Speech:  Clear and Coherent and Normal Rate  Volume:  Normal  Mood:  Depressed  Affect:  Appropriate and Full Range  Thought Process:  Goal Directed, Linear and Descriptions of Associations: Intact  Orientation:  Full (Time, Place, and Person)  Thought Content:  Logical  Suicidal Thoughts:  None currently but recent SI with a plan to overdose.  Homicidal Thoughts:  No  Memory:  Immediate;   Good Recent;   Good Remote;   Good  Judgement:  Good  Insight:  Good  Psychomotor Activity:  Normal  Concentration:  Concentration: Good and Attention Span: Good  Recall:  Good  Fund of Knowledge:  Good  Language:  Good  Akathisia:  No  Handed:  Right  AIMS (if indicated):   N/A  Assets:  Communication Skills Desire for Improvement Financial Resources/Insurance Housing Resilience Social Support  ADL's:  Intact  Cognition:  WNL  Sleep:   N/A   Assessment:  FINLEIGH CHEONG is a 18 y.o. female who was admitted with DKA. Patient endorses worsening depressive symptoms in the setting of multiple psychosocial stressors. She endorses recent SI with a plan to overdose. She has a history of multiple suicide attempts and last attempt was a month ago for which she did not seek medical attention. She denies HI or AVH. She warrants inpatient psychiatric hospitalization for stabilization and treatment.   Treatment Plan Summary: -Patient warrants inpatient psychiatric hospitalization given high risk of harm to self. -Continue Software engineer.  -Continue home psychotropic medications and resume home Lexapro for depression and Seroquel for mood stabilization.  -EKG reviewed and QTc 443 on 9/6. Please closely monitor when starting or increasing QTc prolonging agents.  -Please pursue involuntary commitment if patient refuses voluntary psychiatric hospitalization or attempts to leave the hospital.  -Will sign  off on patient at this time. Please consult psychiatry again as needed.    Disposition: Recommend psychiatric Inpatient admission when medically cleared.  This service was provided via telemedicine using a 2-way, interactive audio and video technology.  Names of all persons participating in this telemedicine service and their role in this encounter. Name: Juanetta Beets, DO Role: Psychiatrist   Name: Nancy [name] Role: Patient    Cherly Beach, DO 09/13/2018 2:05 PM

## 2018-09-13 NOTE — Progress Notes (Signed)
Inpatient Diabetes Program Recommendations  AACE/ADA: New Consensus Statement on Inpatient Glycemic Control (2015)  Target Ranges:  Prepandial:   less than 140 mg/dL      Peak postprandial:   less than 180 mg/dL (1-2 hours)      Critically ill patients:  140 - 180 mg/dL   Lab Results  Component Value Date   GLUCAP 151 (H) 09/13/2018   HGBA1C 12.9 (A) 07/13/2018    Review of Glycemic Control  Diabetes history: DM1 Outpatient Diabetes medications: Lantus 60 units daily Current orders for Inpatient glycemic control: Lantus 30 units daily + Novolog moderate correction tid + hs 0-5 units  Inpatient Diabetes Program Recommendations:   -Patient is listed as type 1, so may need increase to 80% home basal insulin dose = 48 units daily. Patient has in notes that her meal coverage was discontinued but has been on Humalog tid with meals based on food consumption. Patient was here recently admited and DM coordinator spoke with patient on 06/15/18 about A1c of 12.0.  Will follow during hospitalization. -If eating well, may need Novolog meal coverage tid  -Increase Lantus to 48  Thank you, Bethena Roys E. Yadiel Aubry, RN, MSN, CDE  Diabetes Coordinator Inpatient Glycemic Control Team Team Pager [phone] (8am-5pm) 09/13/2018 12:05 PM

## 2018-09-13 NOTE — ED Notes (Signed)
PT belongings in locker 6

## 2018-09-13 NOTE — Progress Notes (Signed)
Initial Nutrition Assessment  RD working remotely.  DOCUMENTATION CODES:   Obesity unspecified  INTERVENTION:   - Ensure Max po BID, each supplement provides 150 kcal and 30 grams of protein  - MVI with minerals daily  NUTRITION DIAGNOSIS:   Increased nutrient needs related to acute illness as evidenced by estimated needs.  GOAL:   Patient will meet greater than or equal to 90% of their needs  MONITOR:   Supplement acceptance, PO intake, Labs, Weight trends, I & O's  REASON FOR ASSESSMENT:   Malnutrition Screening Tool    ASSESSMENT:   18 year old female who presented to the ED on 9/06 with abdominal pain, N/V, melena, and suicidal thoughts. PMH of CHF, IDDM, seizure-like activity. Work-up consistent with dehydration and mild DKA.   No meal completions charted at this time.  Reviewed weight history in chart. Weight trending up over the last year.  Unable to reach pt via phone call to room. RD will order an oral nutrition supplement to aid pt in meeting kcal and protein needs. Will also order daily MVI.  Medications reviewed and include: SSI, Lantus 30 units daily IVF: NS @ 125 ml/hr  Labs reviewed. CBG's: 138-275 x 12 hours (trending down)  NUTRITION - FOCUSED PHYSICAL EXAM:  Unable to complete at this time. RD working remotely.  Diet Order:   Diet Order            Diet Carb Modified Fluid consistency: Thin; Room service appropriate? Yes  Diet effective now              EDUCATION NEEDS:   Not appropriate for education at this time(did not answer phone)  Skin:  Skin Assessment: Reviewed RN Assessment  Last BM:  09/12/18  Height:   Ht Readings from Last 1 Encounters:  09/13/18 5\' 3"  (1.6 m)    Weight:   Wt Readings from Last 1 Encounters:  09/13/18 88.3 kg    Ideal Body Weight:  52.3 kg  BMI:  Body mass index is 34.48 kg/m.  Estimated Nutritional Needs:   Kcal:  1950-2150  Protein:  90-105 grams  Fluid:  >/= 1.8 L    Gaynell Face, MS, RD, LDN Inpatient Clinical Dietitian Pager: [phone] Weekend/After Hours: [phone]

## 2018-09-13 NOTE — ED Notes (Signed)
ED TO INPATIENT HANDOFF REPORT  ED Nurse Name and Phone #:  [id number] [name]  S Name/Age/Gender Nancy [name] 18 y.o. female Room/Bed: 017C/017C  Code Status   Code Status: Full Code  Home/SNF/Other Home Patient oriented to: self, place, time and situation Is this baseline? Yes   Triage Complete: Triage complete  Chief Complaint Abd Pain; Hyperglycemia; Possible GI Bleed  Triage Note Pt comes via [facility] for generalized abd pain that has been going on for several days with blood in her emesis and stool, pt also concerned with recent increase in seizure activity. Pt also hyperglycemia, seen her recently for the same, PTA received 4mg  zofran. Pt also endorses SI, plan to take a lot of ibuprofen, denies HI/AVH    Allergies Allergies  Allergen Reactions  . Ibuprofen Other (See Comments)    GI MD said to not take this anymore    Level of Care/Admitting Diagnosis ED Disposition    ED Disposition Condition Comment   Admit  [facility] [[id number]]  Level of Care: Progressive [102]  Covid Evaluation: Asymptomatic Screening Protocol (No Symptoms)  Diagnosis: DKA, type 1 [facility](HCC) [[id number]][[id number]]  Admitting Physician: Wyvonnia DuskyGARDNER, JARED M [[id number]]  Attending Physician: Hillary BowGARDNER, JARED M [phone]]  Estimated length of stay: past midnight tomorrow  Certification:: I certify this patient will need inpatient services for at least 2 midnights  PT Class (Do Not Modify): Inpatient [101]  PT Acc Code (Do Not Modify): Private [1]       B Medical/Surgery History Past Medical History:  Diagnosis Date  . Acanthosis nigricans   . Anxiety   . CHF (congestive heart failure) (HCC)   . Chronic lower back pain   . Depression   . Dyspepsia   . Obesity   . Ovarian cyst    pt is not aware of this hx (11/24/2017)  . Pre-diabetes   . 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 Premature baby   . Seizures (HCC)   . Type II diabetes mellitus (HCC)    insulin dependant   Past Surgical  History:  Procedure Laterality Date  . ABDOMINAL HERNIA REPAIR     "I was a baby"  . HERNIA REPAIR    . TONSILLECTOMY AND ADENOIDECTOMY    . WISDOM TOOTH EXTRACTION  2017     A IV Location/Drains/Wounds Patient Lines/Drains/Airways Status   Active Line/Drains/Airways    Name:   Placement date:   Placement time:   Site:   Days:   Peripheral IV 09/10/18 Right Antecubital   09/10/18    1400    Antecubital   3   Peripheral IV 09/12/18 Right Antecubital   09/12/18    2226    Antecubital   1   Peripheral IV 09/13/18 Right Hand   09/13/18    0220    Hand   less than 1          Intake/Output Last 24 hours  Intake/Output Summary (Last 24 hours) at 09/13/2018 0300 Last data filed at 09/13/2018 0231 Gross per 24 hour  Intake 100 ml  Output -  Net 100 ml    Labs/Imaging Results for orders placed or performed during the hospital encounter of 09/12/18 (from the past 48 hour(s))  Comprehensive metabolic panel     Status: Abnormal   Collection Time: 09/12/18 10:44 PM  Result Value Ref Range   Sodium 134 (L) 135 - 145 mmol/L    Comment: POST-ULTRACENTRIFUGATION LIPEMIC SPECIMEN    Potassium 4.7 3.5 -  5.1 mmol/L    Comment: SLIGHT HEMOLYSIS   Chloride 98 98 - 111 mmol/L   CO2 20 (L) 22 - 32 mmol/L   Glucose, Bld 412 (H) 70 - 99 mg/dL   BUN 16 6 - 20 mg/dL   Creatinine, Ser 1.61 (H) 0.44 - 1.00 mg/dL   Calcium 9.0 8.9 - 09.6 mg/dL   Total Protein 6.8 6.5 - 8.1 g/dL   Albumin 3.6 3.5 - 5.0 g/dL   AST 18 15 - 41 U/L   ALT 14 0 - 44 U/L   Alkaline Phosphatase 94 38 - 126 U/L   Total Bilirubin 0.7 0.3 - 1.2 mg/dL   GFR calc non Af Amer >60 >60 mL/min   GFR calc Af Amer >60 >60 mL/min   Anion gap 16 (H) 5 - 15    Comment: Performed at [facility], [address], [place], [place] [address]  CBC     Status: Abnormal   Collection Time: 09/12/18 10:44 PM  Result Value Ref Range   WBC 11.7 (H) 4.0 - 10.5 K/uL   RBC 4.70 3.87 - 5.11 MIL/uL   Hemoglobin 13.6 12.0 - 15.0 g/dL    HCT 98.1 19.1 - 47.8 %   MCV 86.2 80.0 - 100.0 fL   MCH 28.9 26.0 - 34.0 pg   MCHC 33.6 30.0 - 36.0 g/dL   RDW 29.5 62.1 - 30.8 %   Platelets 354 150 - 400 K/uL   nRBC 0.0 0.0 - 0.2 %    Comment: Performed at [facility] Lab, [address], [place], [place] [address]  Type and screen [facility]     Status: None   Collection Time: 09/12/18 10:44 PM  Result Value Ref Range   ABO/RH(D) A POS    Antibody Screen NEG    Sample Expiration      09/15/2018,2359 Performed at [facility], [address] [place], [place] [address]   ABO/Rh     Status: None (Preliminary result)   Collection Time: 09/12/18 10:44 PM  Result Value Ref Range   ABO/RH(D)      A POS Performed at [facility], [address], [place], [place] [address]   Brain natriuretic peptide     Status: None   Collection Time: 09/12/18 10:44 PM  Result Value Ref Range   B Natriuretic Peptide 13.8 0.0 - 100.0 pg/mL    Comment: Performed at [facility], [address], [place], [place] [address]  I-Stat beta hCG blood, ED     Status: None   Collection Time: 09/12/18 10:51 PM  Result Value Ref Range   I-stat hCG, quantitative <5.0 <5 mIU/mL   Comment 3            Comment:   GEST. AGE      CONC.  (mIU/mL)   <=1 WEEK        5 - 50     2 WEEKS       50 - 500     3 WEEKS       100 - 10,000     4 WEEKS     1,000 - 30,000        FEMALE AND NON-PREGNANT FEMALE:     LESS THAN 5 mIU/mL   Ethanol     Status: None   Collection Time: 09/12/18 10:57 PM  Result Value Ref Range   Alcohol, Ethyl (B) <10 <10 mg/dL  Comment: (NOTE) Lowest detectable limit for serum alcohol is 10 mg/dL. For medical purposes only. Performed at [facility] Lab, [place] [address] [place], [place] [address]   Salicylate level     Status: None   Collection Time: 09/12/18 10:57 PM  Result Value Ref Range   Salicylate Lvl <2.7 2.8 - 30.0 mg/dL    Comment: Performed at [facility] [address],  [place], [place] [address]  Acetaminophen level     Status: Abnormal   Collection Time: 09/12/18 10:57 PM  Result Value Ref Range   Acetaminophen (Tylenol), Serum <10 (L) 10 - 30 ug/mL    Comment: (NOTE) Therapeutic concentrations vary significantly. A range of 10-30 ug/mL  may be an effective concentration for many patients. However, some  are best treated at concentrations outside of this range. Acetaminophen concentrations >150 ug/mL at 4 hours after ingestion  and >50 ug/mL at 12 hours after ingestion are often associated with  toxic reactions. Performed at [facility] Lab, [place] [address], [place], [place] [address]   POC occult blood, ED     Status: None   Collection Time: 09/12/18 11:42 PM  Result Value Ref Range   Fecal Occult Bld NEGATIVE NEGATIVE  CBG monitoring, ED     Status: Abnormal   Collection Time: 09/13/18 12:29 AM  Result Value Ref Range   Glucose-Capillary 348 (H) 70 - 99 mg/dL  I-STAT 7, (LYTES, BLD GAS, ICA, H+H)     Status: Abnormal   Collection Time: 09/13/18 12:37 AM  Result Value Ref Range   pH, Arterial 7.511 (H) 7.350 - 7.450   pCO2 arterial 33.0 32.0 - 48.0 mmHg   pO2, Arterial 116.0 (H) 83.0 - 108.0 mmHg   Bicarbonate 26.4 20.0 - 28.0 mmol/L   TCO2 27 22 - 32 mmol/L   O2 Saturation 99.0 %   Acid-Base Excess 4.0 (H) 0.0 - 2.0 mmol/L   Sodium 132 (L) 135 - 145 mmol/L   Potassium 4.6 3.5 - 5.1 mmol/L   Calcium, Ion 1.22 1.15 - 1.40 mmol/L   HCT 41.0 36.0 - 46.0 %   Hemoglobin 13.9 12.0 - 15.0 g/dL   Patient temperature 98.7 F    Collection site RADIAL, ALLEN'S TEST ACCEPTABLE    Drawn by Operator    Sample type ARTERIAL   Rapid urine drug screen (hospital performed)     Status: None   Collection Time: 09/13/18 12:49 AM  Result Value Ref Range   Opiates NONE DETECTED NONE DETECTED   Cocaine NONE DETECTED NONE DETECTED   Benzodiazepines NONE DETECTED NONE DETECTED   Amphetamines NONE DETECTED NONE DETECTED   Tetrahydrocannabinol NONE DETECTED NONE  DETECTED   Barbiturates NONE DETECTED NONE DETECTED    Comment: (NOTE) DRUG SCREEN FOR MEDICAL PURPOSES ONLY.  IF CONFIRMATION IS NEEDED FOR ANY PURPOSE, NOTIFY LAB WITHIN 5 DAYS. LOWEST DETECTABLE LIMITS FOR URINE DRUG SCREEN Drug Class                     Cutoff (ng/mL) Amphetamine and metabolites    1000 Barbiturate and metabolites    200 Benzodiazepine                 742 Tricyclics and metabolites     300 Opiates and metabolites        300 Cocaine and metabolites        300 THC  50 Performed at [facility] Lab, [address], [place], [place] [address]   Urinalysis, Routine w reflex microscopic     Status: Abnormal   Collection Time: 09/13/18 12:49 AM  Result Value Ref Range   Color, Urine YELLOW YELLOW   APPearance HAZY (A) CLEAR   Specific Gravity, Urine 1.030 1.005 - 1.030   pH 7.0 5.0 - 8.0   Glucose, UA >=500 (A) NEGATIVE mg/dL   Hgb urine dipstick NEGATIVE NEGATIVE   Bilirubin Urine NEGATIVE NEGATIVE   Ketones, ur 20 (A) NEGATIVE mg/dL   Protein, ur NEGATIVE NEGATIVE mg/dL   Nitrite NEGATIVE NEGATIVE   Leukocytes,Ua NEGATIVE NEGATIVE   RBC / HPF 6-10 0 - 5 RBC/hpf   WBC, UA 0-5 0 - 5 WBC/hpf   Bacteria, UA FEW (A) NONE SEEN   Squamous Epithelial / LPF 0-5 0 - 5   Budding Yeast PRESENT     Comment: Performed at [facility], [address] [place], [place] [address]  Lactic acid, plasma     Status: None   Collection Time: 09/13/18  1:08 AM  Result Value Ref Range   Lactic Acid, Venous 1.5 0.5 - 1.9 mmol/L    Comment: Performed at [facility], [address] [place], [place] [address]  TSH     Status: None   Collection Time: 09/13/18  1:08 AM  Result Value Ref Range   TSH 1.060 0.350 - 4.500 uIU/mL    Comment: Performed by a 3rd Generation assay with a functional sensitivity of <=0.01 uIU/mL. Performed at [facility], [address] [place], [place] [address]   T4, free     Status: None   Collection  Time: 09/13/18  1:08 AM  Result Value Ref Range   Free T4 0.98 0.61 - 1.12 ng/dL    Comment: (NOTE) Biotin ingestion may interfere with free T4 tests. If the results are inconsistent with the TSH level, previous test results, or the clinical presentation, then consider biotin interference. If needed, order repeat testing after stopping biotin. Performed at [facility], [address], [place], [place] [address]   CBG monitoring, ED     Status: Abnormal   Collection Time: 09/13/18  1:23 AM  Result Value Ref Range   Glucose-Capillary 324 (H) 70 - 99 mg/dL  CBG monitoring, ED     Status: Abnormal   Collection Time: 09/13/18  2:22 AM  Result Value Ref Range   Glucose-Capillary 275 (H) 70 - 99 mg/dL   Dg Chest 2 View  Result Date: 09/13/2018 CLINICAL DATA:  Chest pain EXAM: CHEST - 2 VIEW COMPARISON:  September 10, 2018 FINDINGS: The heart size and mediastinal contours are within normal limits. Both lungs are clear. The visualized skeletal structures are unremarkable. IMPRESSION: No acute cardiopulmonary disease. Electronically Signed   By: Jonna Clark M.D.   On: 09/13/2018 02:16    Pending Labs Unresulted Labs (From admission, onward)    Start     Ordered   09/13/18 0500  CBC  Tomorrow morning,   R     09/13/18 0230   09/13/18 0241  Pregnancy, urine  ONCE - STAT,   STAT     09/13/18 0240   09/13/18 0226  Basic metabolic panel  STAT Now then every 4 hours ,   STAT     09/13/18 0228   09/13/18 0155  Lipase, blood  Once,   STAT     09/13/18 0154  09/13/18 0044  T3, free  ONCE - STAT,   STAT     09/13/18 0045   09/13/18 0036  SARS CORONAVIRUS 2 (TAT 6-24 HRS) Nasopharyngeal Nasopharyngeal Swab  (Asymptomatic/Tier 2 Patients Labs)  Once,   STAT    Question Answer Comment  Is this test for diagnosis or screening Screening   Symptomatic for COVID-19 as defined by CDC No   Hospitalized for COVID-19 No   Admitted to ICU for COVID-19 No   Previously tested for COVID-19 Yes    Resident in a congregate (group) care setting No   Employed in healthcare setting No   Pregnant No      09/13/18 0035          Vitals/Pain Today's Vitals   09/13/18 0125 09/13/18 0224 09/13/18 0228 09/13/18 0259  BP:      Pulse:      Resp:      Temp:   98.7 F (37.1 C)   TempSrc:   Oral   SpO2:      Weight: 84.4 kg     Height: 5\' 3"  (1.6 m)     PainSc:  9   10-Worst pain ever    Isolation Precautions No active isolations  Medications Medications  insulin regular, human (MYXREDLIN) 100 units/ 100 mL infusion (4.3 Units/hr Intravenous Rate/Dose Change 09/13/18 0250)  potassium chloride 10 mEq in 100 mL IVPB (10 mEq Intravenous New Bag/Given 09/13/18 0252)  dextrose 5 %-0.45 % sodium chloride infusion (has no administration in time range)  atorvastatin (LIPITOR) tablet 20 mg (has no administration in time range)  lamoTRIgine (LAMICTAL) tablet 25 mg (25 mg Oral Given 09/13/18 0253)  prochlorperazine (COMPAZINE) tablet 10 mg (has no administration in time range)  diclofenac sodium (VOLTAREN) 1 % transdermal gel 2 g (has no administration in time range)  FLUoxetine (PROZAC) capsule 20 mg (has no administration in time range)  traZODone (DESYREL) tablet 300 mg (has no administration in time range)  propranolol (INDERAL) tablet 20 mg (20 mg Oral Given 09/13/18 0256)  topiramate (TOPAMAX) tablet 25 mg (has no administration in time range)  0.9 %  sodium chloride infusion (has no administration in time range)  0.9 %  sodium chloride infusion (has no administration in time range)  dextrose 5 %-0.45 % sodium chloride infusion (has no administration in time range)  enoxaparin (LOVENOX) injection 40 mg (has no administration in time range)  sodium chloride 0.9 % bolus 1,000 mL (1,000 mLs Intravenous New Bag/Given 09/12/18 2319)  promethazine (PHENERGAN) injection 25 mg (25 mg Intravenous Given 09/13/18 0256)    Mobility walks Low fall risk   Focused Assessments    R Recommendations:  See Admitting Provider Note  Report given to:   Additional Notes:

## 2018-09-13 NOTE — ED Notes (Signed)
Patient transported to X-ray 

## 2018-09-13 NOTE — H&P (Signed)
History and Physical    Nancy [name] HBZ:[id number] DOB: 2000-04-01 DOA: 09/12/2018  PCP: [name], NP  Patient coming from: Home  I have personally briefly reviewed patient's old medical records in [place]  Chief Complaint: GIB, hyperglycemia, suicidal  HPI: Nancy [name] is a 18 y.o. 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  Patient presents to ED via EMS with multiple complaints:  First she reports black tarry stool for the past 2 days, frequent episodes of vomiting, some of the vomiting episodes are bright red, and some dark, has diffuse abd pain and progressive SOB over past few days.  Second, she reports having suicidal thoughts ongoing for past several months.  Third she has elevated blood sugars.  No fevers, chills, back pain, dysuria, vaginal bleeding, hematuria, chest pain, palpitations.  ED Course: Tachycardic to 140s.  Mild DKA with 20 keytones in urine, AG of 16.  BNP nl  WBC 11.7.  UDS neg, tylenol and salicylate levels normal.  Ph 7.51 on ABG, O2 sat 99%  Mildly hyperventilating in the upper 20s.  Had a negative PE study x2 days ago on 9/4.  COVID test pending, CXR pending.  Review of Systems: As per HPI, otherwise all review of systems negative.  Past Medical History:  Diagnosis Date  . Acanthosis nigricans   . Anxiety   . CHF (congestive heart failure) ([place])   . Chronic lower back pain   . Depression   . Dyspepsia   . Obesity   . Ovarian cyst    pt is not aware of this hx (11/24/2017)  . Pre-diabetes   . 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 Premature baby   . Seizures ([place])   . Type II diabetes mellitus (HCC)    insulin dependant    Past Surgical History:  Procedure Laterality Date  . ABDOMINAL HERNIA REPAIR     "I was a baby"  . HERNIA REPAIR    . TONSILLECTOMY AND ADENOIDECTOMY    . WISDOM TOOTH  EXTRACTION  2017     reports that she has never smoked. She has never used smokeless tobacco. She reports that she does not drink alcohol or use drugs.  Allergies  Allergen Reactions  . Ibuprofen Other (See Comments)    GI MD said to not take this anymore    Family History  Problem Relation Age of Onset  . Diabetes Mother   . Hypertension Mother   . Obesity Mother   . Asthma Mother   . Allergic rhinitis Mother   . Eczema Mother   . Diabetes Father   . Hypertension Father   . Obesity Father   . Hyperlipidemia Father   . Hypertension Paternal Aunt   . Hypertension Maternal Grandfather   . Colon cancer Maternal Grandfather   . Diabetes Paternal Grandmother   . Obesity Paternal Grandmother   . Diabetes Paternal Grandfather   . Obesity Paternal Grandfather   . Angioedema Neg Hx   . Immunodeficiency Neg Hx   . Urticaria Neg Hx   . Stomach cancer Neg Hx   . Esophageal cancer Neg Hx      Prior to Admission medications   Medication Sig Start Date End Date Taking? Authorizing Provider  atorvastatin (LIPITOR) 20 MG tablet Take 1 tablet (20 mg total) by mouth daily. 07/26/18 10/24/18  Mercy Riding, MD  Blood Glucose Monitoring Suppl (TRUE METRIX METER)  w/Device KIT Use as instructed. Monitor blood glucose levels twice per day 12/02/17   [name], NP  diclofenac sodium (VOLTAREN) 1 % GEL Apply 2 g topically 3 (three) times daily as needed (pain).     [provider]  FLUoxetine (PROZAC) 20 MG capsule Take 1 capsule (20 mg total) by mouth at bedtime. Please allow patient to pick up 30 day supply for free 07/13/18 09/06/18  [name], NP  gabapentin (NEURONTIN) 300 MG capsule Take 1 capsule (300 mg total) by mouth at bedtime for 30 days. 06/02/18 07/05/27  [name], NP  glucagon 1 MG injection Use for Severe Hypoglycemia . Inject 1 mg intramuscularly if unresponsive, unable to swallow, unconscious and/or has seizure Patient taking differently: Inject 1 mg  into the muscle once as needed (for severe hypoglycemia- if unresponsive, unable to swallow, unconscious, and/or has had seizure(s)).  07/20/13 12/19/25  Renato Shin, MD  glucose blood (TRUE METRIX BLOOD GLUCOSE TEST) test strip Use as instructed 02/15/18   [name], NP  insulin glargine (LANTUS) 100 UNIT/ML injection Inject 0.2 mLs (20 Units total) into the skin 2 (two) times daily. Patient taking differently: Inject 60 Units into the skin at bedtime.  07/26/18   Mercy Riding, MD  insulin lispro (HUMALOG) 100 UNIT/ML injection Inject 0-0.15 mLs (0-15 Units total) into the skin 4 (four) times daily -  with meals and at bedtime. Patient not taking: Reported on 09/03/2018 07/26/18 08/25/18  Mercy Riding, MD  Insulin Pen Needle (B-D UF III MINI PEN NEEDLES) 31G X 5 MM MISC Use as instructed. Monitor blood glucose levels twice per day 02/15/18   [name], NP  lamoTRIgine (LAMICTAL) 25 MG tablet Take 1 tablet (25 mg total) by mouth 2 (two) times daily. 08/08/18   [place] Donnamarie Poag, DO  meloxicam (MOBIC) 7.5 MG tablet Take 7.5 mg by mouth at bedtime as needed for pain.     [provider]  prochlorperazine (COMPAZINE) 10 MG tablet Take 1 tablet (10 mg total) by mouth 2 (two) times daily as needed for nausea or vomiting. 09/03/18   Isla Pence, MD  propranolol (INDERAL) 20 MG tablet Take 1 tablet (20 mg total) by mouth 2 (two) times daily. Please allow patient to pick up 30 day supply for free 07/13/18 10/06/18  [name], NP  QUEtiapine (SEROQUEL) 100 MG tablet Take 400 mg by mouth at bedtime.     [provider]  topiramate (TOPAMAX) 25 MG tablet Take 1 tablet (25 mg total) by mouth 2 (two) times daily. Please allow patient to pick up 30 day supply for free 07/13/18 10/06/18  [name], NP  trazodone (DESYREL) 300 MG tablet Take 300 mg by mouth at bedtime.    [provider]  TRUEPLUS LANCETS 28G MISC Use as instructed. Monitor blood glucose levels twice per  day 12/02/17   [name], NP    Physical Exam: Vitals:   09/13/18 0000 09/13/18 0015 09/13/18 0125 09/13/18 0228  BP: 119/83 (!) 115/93    Pulse: (!) 138 (!) 127    Resp: (!) 28 (!) 25    Temp:    98.7 F (37.1 C)  TempSrc:    Oral  SpO2: 98% 99%    Weight:   84.4 kg   Height:   5' 3"  (1.6 m)     Constitutional: NAD, calm, comfortable Eyes: PERRL, lids and conjunctivae normal ENMT: Mucous membranes are moist. Posterior pharynx  clear of any exudate or lesions.Normal dentition.  Neck: normal, supple, no masses, no thyromegaly Respiratory: clear to auscultation bilaterally, no wheezing, no crackles. Normal respiratory effort. No accessory muscle use.  Cardiovascular: Tachycardic, regular Abdomen: no tenderness, no masses palpated. No hepatosplenomegaly. Bowel sounds positive.  Musculoskeletal: no clubbing / cyanosis. No joint deformity upper and lower extremities. Good ROM, no contractures. Normal muscle tone.  Skin: no rashes, lesions, ulcers. No induration Neurologic: CN 2-12 grossly intact. Sensation intact, DTR normal. Strength 5/5 in all 4.  Psychiatric: Normal judgment and insight. Alert and oriented x 3. Normal mood.    Labs on Admission: I have personally reviewed following labs and imaging studies  CBC: Recent Labs  Lab 09/06/18 1631 09/10/18 1423 09/12/18 2244 09/13/18 0037  WBC 9.1 9.3 11.7*  --   NEUTROABS 5.2 6.3  --   --   HGB 13.7 12.6 13.6 13.9  HCT 42.8 39.2 40.5 41.0  MCV 88.6 87.7 86.2  --   PLT 406* 353 354  --    Basic Metabolic Panel: Recent Labs  Lab 09/06/18 1810 09/10/18 1423 09/12/18 2244 09/13/18 0037  NA 135 132* 134* 132*  K 3.9 5.3* 4.7 4.6  CL 96* 97* 98  --   CO2 26 25 20*  --   GLUCOSE 354* 481* 412*  --   BUN 12 12 16   --   CREATININE 0.51 0.77 1.22*  --   CALCIUM 9.1 8.8* 9.0  --    GFR: Estimated Creatinine Clearance: 75.7 mL/min (A) (by C-G formula based on SCr of 1.22 mg/dL (H)). Liver Function Tests: Recent  Labs  Lab 09/06/18 1810 09/10/18 1423 09/12/18 2244  AST 17 30 18   ALT 18 12 14   ALKPHOS 90 92 94  BILITOT 0.2* 0.7 0.7  PROT 6.9 6.0* 6.8  ALBUMIN 3.8 3.7 3.6   Recent Labs  Lab 09/06/18 1810 09/10/18 1423  LIPASE 26 28   No results for input(s): AMMONIA in the last 168 hours. Coagulation Profile: No results for input(s): INR, PROTIME in the last 168 hours. Cardiac Enzymes: No results for input(s): CKTOTAL, CKMB, CKMBINDEX, TROPONINI in the last 168 hours. BNP (last 3 results) No results for input(s): PROBNP in the last 8760 hours. HbA1C: No results for input(s): HGBA1C in the last 72 hours. CBG: Recent Labs  Lab 09/10/18 1411 09/10/18 1739 09/13/18 0029 09/13/18 0123 09/13/18 0222  GLUCAP 499* 318* 348* 324* 275*   Lipid Profile: No results for input(s): CHOL, HDL, LDLCALC, TRIG, CHOLHDL, LDLDIRECT in the last 72 hours. Thyroid Function Tests: Recent Labs    09/13/18 0108  TSH 1.060  FREET4 0.98   Anemia Panel: No results for input(s): VITAMINB12, FOLATE, FERRITIN, TIBC, IRON, RETICCTPCT in the last 72 hours. Urine analysis:    Component Value Date/Time   COLORURINE YELLOW 09/13/2018 0049   APPEARANCEUR HAZY (A) 09/13/2018 0049   LABSPEC 1.030 09/13/2018 0049   PHURINE 7.0 09/13/2018 0049   GLUCOSEU >=500 (A) 09/13/2018 0049   HGBUR NEGATIVE 09/13/2018 0049   BILIRUBINUR NEGATIVE 09/13/2018 0049   BILIRUBINUR negative 07/13/2018 1651   KETONESUR 20 (A) 09/13/2018 0049   PROTEINUR NEGATIVE 09/13/2018 0049   UROBILINOGEN 0.2 07/13/2018 1651   UROBILINOGEN 1.0 07/09/2017 1324   NITRITE NEGATIVE 09/13/2018 0049   LEUKOCYTESUR NEGATIVE 09/13/2018 0049    Radiological Exams on Admission: Dg Chest 2 View  Result Date: 09/13/2018 CLINICAL DATA:  Chest pain EXAM: CHEST - 2 VIEW COMPARISON:  September 10, 2018 FINDINGS: The heart size  and mediastinal contours are within normal limits. Both lungs are clear. The visualized skeletal structures are  unremarkable. IMPRESSION: No acute cardiopulmonary disease. Electronically Signed   By: Prudencio Pair M.D.   On: 09/13/2018 02:16    EKG: Independently reviewed.  Assessment/Plan Principal Problem:   DKA, type 1 (HCC) Active Problems:   Non compliance with medical treatment   Sinus tachycardia by electrocardiogram   Chronic abdominal pain   Suicidal ideations    1. DKA - 1. DKA pathway 2. Insulin gtt 3. Hold home meds 4. IVF: 2L bolus, 125 cc/hr NS then 100 cc/hr D5 half 5. BMP Q4H 2. Sinus tachycardia - 1. Appears to be somewhat chronic, looks like her baseline is 110-120 most ED visits and admissions. 2. Dr. Hal Hope also noted this back in Jan. 3. Looks like shes supposed to be on chronic propranolol but hasnt filled since July. 4. Still more tachycardic than baseline today 5. CTA PE study was negative for PE (or other acute findings for that matter) just 2 days ago on 9/4 6. UDS neg 7. Could be dehydration from DKA 8. TSH nl today 9. Tele monitor 10. Resume propranolol 11. Treat DKA 12. If still S.Tach after DKA improved, then may need further work up. 3. 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 Psych consult in AM 2. Suicide precautions 3. Continue current home psych meds that we can verify she is either on or was on: 1. No record of her ever having filled the seroquel so will hold off on ordering that 4. Would strongly recommend avoiding controlled substances, feel that patient has high risk of misusing these: 1. Patient is already (by her own report) taking her mothers neurontin pills (patient not prescribed this at current time). 4. Report of melena - 1. Red stool on exam that is hemoccult negative today 2. Despite the 3 day report of symptoms, HGB is 13.6 today (baseline) 3. Repeat CBC in AM 4. But as of right now, not clinically seeing a GIB.  DVT prophylaxis: Lovenox Code Status: Full Family  Communication: No family in room Disposition Plan: TBD Consults called: None, call psych in AM Admission status: Admit to inpatient  Severity of Illness: The appropriate patient status for this patient is INPATIENT. Inpatient status is judged to be reasonable and necessary in order to provide the required intensity of service to ensure the patient's safety. The patient's presenting symptoms, physical exam findings, and initial radiographic and laboratory data in the context of their chronic comorbidities is felt to place them at high risk for further clinical deterioration. Furthermore, it is not anticipated that the patient will be medically stable for discharge from the hospital within 2 midnights of admission. The following factors support the patient status of inpatient.   IP status for treatment of DKA with confirmed keytones in urine on UA.  * I certify that at the point of admission it is my clinical judgment that the patient will require inpatient hospital care spanning beyond 2 midnights from the point of admission due to high intensity of service, high risk for further deterioration and high frequency of surveillance required.*    Travion Ke M. DO [facility]  How to contact the [facility] Attending or Consulting provider [place] or covering provider during after hours [place], for this patient?  1. Check the care team in [facility] and look for a) attending/consulting [facility] provider listed and b) the [facility] team listed 2. Log into  [email or web address]  Amion Physician Scheduling and messaging for groups and whole hospitals  On call and physician scheduling software for group practices, residents, hospitalists and other medical providers for call, clinic, rotation and shift schedules. On[employer] is a hospital-wide system for scheduling doctors and paging doctors on call. EasyPlot is for scientific plotting and data analysis.  [email or web address]  and use [place]'s universal password to access.  If you do not have the password, please contact the hospital operator.  3. Locate the [facility] provider you are looking for under [facility] and page to a number that you can be directly reached. 4. If you still have difficulty reaching the provider, please page the [facility] (Director on Call) for the Hospitalists listed on amion for assistance.  09/13/2018, 2:31 AM

## 2018-09-14 DIAGNOSIS — F332 Major depressive disorder, recurrent severe without psychotic features: Secondary | ICD-10-CM

## 2018-09-14 LAB — T3, FREE: T3, Free: 3.1 pg/mL (ref 2.0–4.4)

## 2018-09-14 LAB — GLUCOSE, CAPILLARY
Glucose-Capillary: 193 mg/dL — ABNORMAL HIGH (ref 70–99)
Glucose-Capillary: 242 mg/dL — ABNORMAL HIGH (ref 70–99)
Glucose-Capillary: 133 mg/dL — ABNORMAL HIGH (ref 70–99)
Glucose-Capillary: 101 mg/dL — ABNORMAL HIGH (ref 70–99)
Glucose-Capillary: 339 mg/dL — ABNORMAL HIGH (ref 70–99)

## 2018-09-14 NOTE — Progress Notes (Signed)
   Pt is medically stable  --Medically cleared  --Okay to transfer to inpatient psychiatric services if bed is available  Roxan Hockey, MD

## 2018-09-14 NOTE — Progress Notes (Signed)
Patient Demographics:    Nancy MelnickRashonda [name], is a 18 y.o. female, DOB - 07/27/2000, OZH:[id number]  Admit date - 09/12/2018   Admitting Physician Nancy BowJared M Gardner, DO  Outpatient Primary MD for the patient is Claiborne RiggFleming, Zelda W, NP  LOS - 1   Chief Complaint  Patient presents with   GI Bleeding   Hyperglycemia   Suicidal        Subjective:    Nancy [name] today has no fevers, no emesis,  No chest pain,  -resting comfortably, eating and drinking well, one-to-one sitter at bedside  Assessment  & Plan :    Principal Problem:   MDD (major depressive disorder), recurrent severe, without psychosis (HCC) Active Problems:   Non compliance with medical treatment   Sinus tachycardia by electrocardiogram   Chronic abdominal pain   DKA, type 1 (HCC)   Suicidal ideations  Brief summary -18 y.o. 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, anxiety and depression admitted on 09/13/2018 with multiple psychosomatic complaints and found to have mild DKA with anion gap of 16 -DKA symptomatology has since resolved -Patient is medically stable at this time awaiting transfer to inpatient psychiatric unit  A/p 1) depressive disorder with suicidal ideation-- Pt is medically stable --Medically cleared, --Okay to transfer to inpatient psychiatric services if bed is available -Psychiatric consult appreciated -Psychiatrist recommends IVC if patient refuses to go to psychiatric facility voluntarily -Continue Lexapro 20 mg daily, Prozac 20 mg daily, Lamictal 25 mg twice daily, Seroquel 400 mg nightly and Topamax 25 mg twice daily as well as trazodone 300 mg nightly -Watch for possible excessive sedation  2)DM1-recent A1c is 12.9, overall poorly controlled, DKA resolved -Continue Lantus 48 units daily, NovoLog 5 units 3 times  daily, Use Novolog/Humalog Sliding scale insulin with Accu-Cheks/Fingersticks as ordered  Disposition/Need for in-Hospital Stay- patient unable to be discharged at this time due to --- awaiting transfer to inpatient psychiatric unit  Code Status : Full  Family Communication:   NA (patient is alert, awake and coherent)   Disposition Plan  : Awaiting transfer to inpatient psychiatric unit  Consults  :  psych  DVT Prophylaxis  :  Lovenox -    Lab Results  Component Value Date   PLT 324 09/13/2018    Inpatient Medications  Scheduled Meds:  atorvastatin  20 mg Oral Daily   enoxaparin (LOVENOX) injection  40 mg Subcutaneous Q24H   escitalopram  20 mg Oral Daily   FLUoxetine  20 mg Oral QHS   insulin aspart  0-15 Units Subcutaneous TID WC   insulin aspart  0-5 Units Subcutaneous QHS   insulin aspart  5 Units Subcutaneous TID WC   insulin glargine  48 Units Subcutaneous Daily   lamoTRIgine  25 mg Oral BID   multivitamin with minerals  1 tablet Oral Daily   propranolol  20 mg Oral BID   Ensure Max Protein  11 oz Oral BID   QUEtiapine  400 mg Oral QHS   topiramate  25 mg Oral BID   trazodone  300 mg Oral QHS   Continuous Infusions:  sodium chloride Stopped (09/14/18 1220)   PRN Meds:.acetaminophen, diclofenac sodium, prochlorperazine, traMADol  Anti-infectives (From admission, onward)   None        Objective:   Vitals:   09/14/18 0727 09/14/18 0749 09/14/18 1241 09/14/18 1632  BP:  107/66    Pulse:  100    Resp:  12    Temp: 98.2 F (36.8 C) 98.2 F (36.8 C) 98.9 F (37.2 C) 99.4 F (37.4 C)  TempSrc: Oral Oral Oral Oral  SpO2:  100%    Weight:      Height:        Wt Readings from Last 3 Encounters:  09/13/18 88.3 kg  09/10/18 84.4 kg  09/06/18 83.9 kg     Intake/Output Summary (Last 24 hours) at 09/14/2018 1717 Last data filed at 09/14/2018 0400 Gross per 24 hour  Intake 1006.06 ml  Output --  Net 1006.06 ml     Physical  Exam  Gen:- Awake Alert,  In no apparent distress  HEENT:- [place].AT, No sclera icterus Neck-Supple Neck,No JVD,.  Lungs-  CTAB , fair symmetrical air movement CV- S1, S2 normal, regular  Abd-  +ve B.Sounds, Abd Soft, No tenderness,    Extremity/Skin:- No  edema, pedal pulses present  Psych-affect is appropriate,, she is cooperative, oriented x3 Neuro-no new focal deficits, no tremors   Data Review:   Micro Results Recent Results (from the past 240 hour(s))  SARS CORONAVIRUS 2 (TAT 6-24 HRS) Nasopharyngeal Nasopharyngeal Swab     Status: None   Collection Time: 09/13/18  1:09 AM   Specimen: Nasopharyngeal Swab  Result Value Ref Range Status   SARS Coronavirus 2 NEGATIVE NEGATIVE Final    Comment: (NOTE) SARS-CoV-2 target nucleic acids are NOT DETECTED. The SARS-CoV-2 RNA is generally detectable in upper and lower respiratory specimens during the acute phase of infection. Negative results do not preclude SARS-CoV-2 infection, do not rule out co-infections with other pathogens, and should not be used as the sole basis for treatment or other patient management decisions. Negative results must be combined with clinical observations, patient history, and epidemiological information. The expected result is Negative. Fact Sheet for Patients: SugarRoll.be Fact Sheet for Healthcare Providers: https://www.woods-mathews.com/ This test is not yet approved or cleared by the [place] FDA and  has been authorized for detection and/or diagnosis of SARS-CoV-2 by FDA under an Emergency Use Authorization (EUA). This EUA will remain  in effect (meaning this test can be used) for the duration of the COVID-19 declaration under Section 56 4(b)(1) of the Act, 21 U.S.C. section 360bbb-3(b)(1), unless the authorization is terminated or revoked sooner. Performed at [facility], [place] [address] [place], [place] [address]   MRSA PCR Screening      Status: None   Collection Time: 09/13/18  8:28 AM   Specimen: Nasal Mucosa; Nasopharyngeal  Result Value Ref Range Status   MRSA by PCR NEGATIVE NEGATIVE Final    Comment:        The GeneXpert MRSA Assay (FDA approved for NASAL specimens only), is one component of a comprehensive MRSA colonization surveillance program. It is not intended to diagnose MRSA infection nor to guide or monitor treatment for MRSA infections. Performed at [facility], [place] [address] [place], [place] [address]     Radiology Reports Dg Chest 2 View  Result Date: 09/13/2018 CLINICAL DATA:  Chest pain EXAM: CHEST - 2 VIEW COMPARISON:  September 10, 2018 FINDINGS: The heart size and mediastinal contours are within normal limits. Both lungs are clear. The visualized skeletal structures are unremarkable. IMPRESSION: No  acute cardiopulmonary disease. Electronically Signed   By: Jonna Clark M.D.   On: 09/13/2018 02:16   Dg Chest 2 View  Result Date: 09/10/2018 CLINICAL DATA:  Left-sided chest pain with shortness of breath EXAM: CHEST - 2 VIEW COMPARISON:  09/03/2018 FINDINGS: The heart size and mediastinal contours are within normal limits. Both lungs are clear. The visualized skeletal structures are unremarkable. IMPRESSION: No active cardiopulmonary disease. Electronically Signed   By: Duanne Guess M.D.   On: 09/10/2018 15:09   Dg Chest 2 View  Result Date: 09/03/2018 CLINICAL DATA:  18 year old female with left upper chest pain radiating to the back for 1 week. EXAM: CHEST - 2 VIEW COMPARISON:  Chest radiographs 07/19/2018 and earlier. FINDINGS: AP and lateral views. Mildly low lung volumes. Mediastinal contours remain normal. Visualized tracheal air column is within normal limits. Both lungs appear clear. No pneumothorax or pleural effusion. No acute osseous abnormality identified. Negative visible bowel gas pattern. IMPRESSION: Negative.  No cardiopulmonary abnormality. Electronically Signed   By: Odessa Fleming  M.D.   On: 09/03/2018 17:14   Ct Angio Chest Pe [name] And/or Wo Contrast  Result Date: 09/10/2018 CLINICAL DATA:  18 year old female with shortness of breath and high pretest probability for pulmonary embolism. EXAM: CT ANGIOGRAPHY CHEST WITH CONTRAST TECHNIQUE: Multidetector CT imaging of the chest was performed using the standard protocol during bolus administration of intravenous contrast. Multiplanar CT image reconstructions and MIPs were obtained to evaluate the vascular anatomy. CONTRAST:  OMNIPAQUE IOHEXOL 350 MG/ML SOLN COMPARISON:  Prior CT scan of the heart 01/14/2018 FINDINGS: Cardiovascular: Adequate opacification of the pulmonary arteries to the segmental level. No evidence of central filling defect to suggest acute pulmonary embolus. The main pulmonary artery is normal in size. 2 vessel aortic arch. The right brachiocephalic and left common carotid artery share a common origin. No evidence of aneurysm or dissection. The heart is normal in size. No pericardial effusion. Mediastinum/Nodes: Unremarkable thyroid gland. 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 No suspicious lymphadenopathy. Unremarkable esophagus. Lungs/Pleura: Lungs are clear. No pleural effusion or pneumothorax. Upper Abdomen: No acute abnormality. Musculoskeletal: No acute fracture or aggressive appearing lytic or blastic osseous lesion. Review of the MIP images confirms the above findings. IMPRESSION: 1. Negative for acute pulmonary embolus, pneumonia or other acute cardiopulmonary process. Electronically Signed   By: Malachy Moan M.D.   On: 09/10/2018 16:24   US Abdomen Limited  Result Date: 09/06/2018 CLINICAL DATA:  Right upper quadrant and mid abdominal pain, nonacute. History of hernia surgery as a child. EXAM: ULTRASOUND ABDOMEN LIMITED RIGHT UPPER QUADRANT COMPARISON:  Ultrasound 01/26/2018. FINDINGS: Gallbladder: The gallbladder is incompletely distended. No  gallstones or wall thickening visualized. No sonographic Murphy sign noted by sonographer. Common bile duct: Diameter: 4 mm Liver: The appendix echogenicity appears mildly increased. No focal abnormality identified. Portal vein is patent on color Doppler imaging with normal direction of blood flow towards the liver. Other: None. IMPRESSION: No acute findings or evidence of biliary disease. Mildly increased hepatic echogenicity without focal abnormality. Electronically Signed   By: Carey Bullocks M.D.   On: 09/06/2018 17:19     CBC Recent Labs  Lab 09/10/18 1423 09/12/18 2244 09/13/18 0037 09/13/18 0628  WBC 9.3 11.7*  --  10.8*  HGB 12.6 13.6 13.9 12.6  HCT 39.2 40.5 41.0 38.3  PLT 353 354  --  324  MCV 87.7 86.2  --  85.7  MCH 28.2 28.9  --  28.2  MCHC 32.1 33.6  --  32.9  RDW 12.2 12.2  --  12.4  LYMPHSABS 2.0  --   --   --   MONOABS 0.8  --   --   --   EOSABS 0.1  --   --   --   BASOSABS 0.1  --   --   --     Chemistries  Recent Labs  Lab 09/10/18 1423 09/12/18 2244 09/13/18 0037 09/13/18 0227 09/13/18 0628 09/13/18 1051 09/13/18 1443  NA 132* 134* 132* 136 137 137 134*  K 5.3* 4.7 4.6 4.5 3.7 3.9 4.0  CL 97* 98  --  98 103 105 103  CO2 25 20*  --  24 23 23 23   GLUCOSE 481* 412*  --  281* 135* 121* 265*  BUN 12 16  --  16 14 12 13   CREATININE 0.77 1.22*  --  1.20* 1.00 0.92 1.07*  CALCIUM 8.8* 9.0  --  9.4 8.2* 8.3* 8.5*  AST 30 18  --   --   --   --   --   ALT 12 14  --   --   --   --   --   ALKPHOS 92 94  --   --   --   --   --   BILITOT 0.7 0.7  --   --   --   --   --    ------------------------------------------------------------------------------------------------------------------ No results for input(s): CHOL, HDL, LDLCALC, TRIG, CHOLHDL, LDLDIRECT in the last 72 hours.  Lab Results  Component Value Date   HGBA1C 12.9 (A) 07/13/2018    ------------------------------------------------------------------------------------------------------------------ Recent Labs    09/13/18 0108  TSH 1.060  T3FREE 3.1   ------------------------------------------------------------------------------------------------------------------ No results for input(s): VITAMINB12, FOLATE, FERRITIN, TIBC, IRON, RETICCTPCT in the last 72 hours.  Coagulation profile No results for input(s): INR, PROTIME in the last 168 hours.  No results for input(s): DDIMER in the last 72 hours.  Cardiac Enzymes No results for input(s): CKMB, TROPONINI, MYOGLOBIN in the last 168 hours.  Invalid input(s): CK ------------------------------------------------------------------------------------------------------------------    Component Value Date/Time   BNP 13.8 09/12/2018 2244     Greta Yung M.D on 09/14/2018 at 5:17 PM  Go to [email or web address] - for contact info  [facility] - Office  [phone]

## 2018-09-15 ENCOUNTER — Inpatient Hospital Stay
Admission: AD | Admit: 2018-09-15 | Discharge: 2018-09-17 | DRG: 885 | Disposition: A | Discharge status: Home / Self Care | Payer: Medicaid Other | Source: Intra-hospital | Attending: Psychiatry | Admitting: Psychiatry

## 2018-09-15 ENCOUNTER — Other Ambulatory Visit: Payer: Self-pay

## 2018-09-15 DIAGNOSIS — F445 Conversion disorder with seizures or convulsions: Secondary | ICD-10-CM | POA: Diagnosis present

## 2018-09-15 DIAGNOSIS — I509 Heart failure, unspecified: Secondary | ICD-10-CM | POA: Diagnosis present

## 2018-09-15 DIAGNOSIS — I110 Hypertensive heart disease with heart failure: Secondary | ICD-10-CM | POA: Diagnosis present

## 2018-09-15 DIAGNOSIS — I10 Essential (primary) hypertension: Secondary | ICD-10-CM | POA: Diagnosis present

## 2018-09-15 DIAGNOSIS — E1110 Type 2 diabetes mellitus with ketoacidosis without coma: Secondary | ICD-10-CM | POA: Diagnosis present

## 2018-09-15 DIAGNOSIS — E1159 Type 2 diabetes mellitus with other circulatory complications: Secondary | ICD-10-CM | POA: Diagnosis present

## 2018-09-15 DIAGNOSIS — G8929 Other chronic pain: Secondary | ICD-10-CM | POA: Diagnosis present

## 2018-09-15 DIAGNOSIS — F329 Major depressive disorder, single episode, unspecified: Secondary | ICD-10-CM

## 2018-09-15 DIAGNOSIS — E1065 Type 1 diabetes mellitus with hyperglycemia: Secondary | ICD-10-CM | POA: Diagnosis present

## 2018-09-15 DIAGNOSIS — F332 Major depressive disorder, recurrent severe without psychotic features: Secondary | ICD-10-CM | POA: Diagnosis not present

## 2018-09-15 DIAGNOSIS — IMO0002 Reserved for concepts with insufficient information to code with codable children: Secondary | ICD-10-CM | POA: Diagnosis present

## 2018-09-15 DIAGNOSIS — R1013 Epigastric pain: Secondary | ICD-10-CM | POA: Diagnosis present

## 2018-09-15 DIAGNOSIS — R1084 Generalized abdominal pain: Secondary | ICD-10-CM | POA: Diagnosis present

## 2018-09-15 DIAGNOSIS — R45851 Suicidal ideations: Secondary | ICD-10-CM | POA: Diagnosis present

## 2018-09-15 DIAGNOSIS — I152 Hypertension secondary to endocrine disorders: Secondary | ICD-10-CM | POA: Diagnosis present

## 2018-09-15 DIAGNOSIS — F32A Depression, unspecified: Secondary | ICD-10-CM

## 2018-09-15 LAB — BASIC METABOLIC PANEL
Anion gap: 7 (ref 5–15)
BUN: 16 mg/dL (ref 6–20)
CO2: 22 mmol/L (ref 22–32)
Calcium: 8.5 mg/dL — ABNORMAL LOW (ref 8.9–10.3)
Chloride: 105 mmol/L (ref 98–111)
Creatinine, Ser: 0.67 mg/dL (ref 0.44–1.00)
GFR calc Af Amer: >60 mL/min (ref >60)
GFR calc non Af Amer: >60 mL/min (ref >60)
Glucose, Bld: 225 mg/dL — ABNORMAL HIGH (ref 70–99)
Potassium: 3.8 mmol/L (ref 3.5–5.1)
Sodium: 134 mmol/L — ABNORMAL LOW (ref 135–145)

## 2018-09-15 LAB — GLUCOSE, CAPILLARY
Glucose-Capillary: 226 mg/dL — ABNORMAL HIGH (ref 70–99)
Glucose-Capillary: 258 mg/dL — ABNORMAL HIGH (ref 70–99)
Glucose-Capillary: 265 mg/dL — ABNORMAL HIGH (ref 70–99)
Glucose-Capillary: 122 mg/dL — ABNORMAL HIGH (ref 70–99)
Glucose-Capillary: 153 mg/dL — ABNORMAL HIGH (ref 70–99)
Glucose-Capillary: 358 mg/dL — ABNORMAL HIGH (ref 70–99)

## 2018-09-15 LAB — LEVETIRACETAM LEVEL: Levetiracetam Lvl: <1 ug/mL — ABNORMAL LOW (ref 10.0–40.0)

## 2018-09-15 MED ORDER — INSULIN GLARGINE 100 UNIT/ML ~~LOC~~ SOLN: 52.0000 [IU] | Freq: Every day | SUBCUTANEOUS | 11 refills | Status: DC

## 2018-09-15 MED ORDER — INSULIN ASPART 100 UNIT/ML ~~LOC~~ SOLN
0.0000 [IU] | Freq: Three times a day (TID) | SUBCUTANEOUS | Status: DC
  Administered 2018-09-16 (×2): 3 [IU] via SUBCUTANEOUS
  Administered 2018-09-16 – 2018-09-17 (×2): 2 [IU] via SUBCUTANEOUS
  Administered 2018-09-17: 12:00:00 5 [IU] via SUBCUTANEOUS
  Filled 2018-09-15 (×4): qty 1

## 2018-09-15 MED ORDER — DICLOFENAC SODIUM 1 % TD GEL
2.0000 g | Freq: Three times a day (TID) | TRANSDERMAL | Status: DC | PRN
  Filled 2018-09-15: qty 100

## 2018-09-15 MED ORDER — MAGNESIUM HYDROXIDE 400 MG/5ML PO SUSP: 30.0000 mL | Freq: Every day | ORAL | Status: DC | PRN

## 2018-09-15 MED ORDER — LAMOTRIGINE 25 MG PO TABS
25.0000 mg | ORAL_TABLET | Freq: Two times a day (BID) | ORAL | Status: DC
  Administered 2018-09-16 – 2018-09-17 (×3): 25 mg via ORAL
  Filled 2018-09-15 (×3): qty 1

## 2018-09-15 MED ORDER — INSULIN ASPART 100 UNIT/ML ~~LOC~~ SOLN
5.0000 [IU] | Freq: Three times a day (TID) | SUBCUTANEOUS | Status: DC
  Administered 2018-09-16 – 2018-09-17 (×5): 5 [IU] via SUBCUTANEOUS
  Filled 2018-09-15 (×3): qty 1

## 2018-09-15 MED ORDER — LACTULOSE 10 GM/15ML PO SOLN
30.0000 g | Freq: Once | ORAL | Status: AC
  Administered 2018-09-15: 11:00:00 30 g via ORAL
  Filled 2018-09-15: qty 45

## 2018-09-15 MED ORDER — ATORVASTATIN CALCIUM 20 MG PO TABS
20.0000 mg | ORAL_TABLET | Freq: Every day | ORAL | Status: DC
  Administered 2018-09-16: 17:00:00 20 mg via ORAL
  Filled 2018-09-15: qty 1

## 2018-09-15 MED ORDER — TOPIRAMATE 25 MG PO TABS
25.0000 mg | ORAL_TABLET | Freq: Two times a day (BID) | ORAL | Status: DC
  Administered 2018-09-16 – 2018-09-17 (×3): 25 mg via ORAL
  Filled 2018-09-15 (×5): qty 1

## 2018-09-15 MED ORDER — INSULIN GLARGINE 100 UNIT/ML ~~LOC~~ SOLN
52.0000 [IU] | Freq: Every day | SUBCUTANEOUS | Status: DC
  Administered 2018-09-15: 52 [IU] via SUBCUTANEOUS
  Filled 2018-09-15: qty 0.52

## 2018-09-15 MED ORDER — SENNOSIDES-DOCUSATE SODIUM 8.6-50 MG PO TABS: 2.0000 | ORAL_TABLET | Freq: Every day | ORAL | Status: DC

## 2018-09-15 MED ORDER — ALUM & MAG HYDROXIDE-SIMETH 200-200-20 MG/5ML PO SUSP: 30.0000 mL | ORAL | Status: DC | PRN

## 2018-09-15 MED ORDER — POLYETHYLENE GLYCOL 3350 17 G PO PACK: 17.0000 g | PACK | Freq: Every day | ORAL | 2 refills | Status: DC

## 2018-09-15 MED ORDER — ACETAMINOPHEN 325 MG PO TABS: 650.0000 mg | ORAL_TABLET | Freq: Four times a day (QID) | ORAL | 0 refills | Status: DC | PRN

## 2018-09-15 MED ORDER — FLUOXETINE HCL 20 MG PO CAPS
20.0000 mg | ORAL_CAPSULE | Freq: Every day | ORAL | Status: DC
  Administered 2018-09-15: 20 mg via ORAL
  Filled 2018-09-15: qty 1

## 2018-09-15 MED ORDER — SENNOSIDES-DOCUSATE SODIUM 8.6-50 MG PO TABS
2.0000 | ORAL_TABLET | Freq: Every day | ORAL | Status: DC
  Administered 2018-09-15 – 2018-09-16 (×2): 2 via ORAL
  Filled 2018-09-15 (×3): qty 2

## 2018-09-15 MED ORDER — TRAMADOL HCL 50 MG PO TABS
50.0000 mg | ORAL_TABLET | Freq: Four times a day (QID) | ORAL | Status: DC | PRN
  Administered 2018-09-16: 20:00:00 50 mg via ORAL
  Filled 2018-09-15: qty 1

## 2018-09-15 MED ORDER — ESCITALOPRAM OXALATE 10 MG PO TABS
20.0000 mg | ORAL_TABLET | Freq: Every day | ORAL | Status: DC
  Administered 2018-09-16 – 2018-09-17 (×2): 20 mg via ORAL
  Filled 2018-09-15 (×2): qty 2

## 2018-09-15 MED ORDER — QUETIAPINE FUMARATE 200 MG PO TABS
400.0000 mg | ORAL_TABLET | Freq: Every day | ORAL | Status: DC
  Administered 2018-09-15: 400 mg via ORAL
  Filled 2018-09-15: qty 2

## 2018-09-15 MED ORDER — HYDROXYZINE HCL 25 MG PO TABS
25.0000 mg | ORAL_TABLET | Freq: Three times a day (TID) | ORAL | Status: DC | PRN
  Administered 2018-09-15 – 2018-09-16 (×2): 25 mg via ORAL
  Filled 2018-09-15 (×2): qty 1

## 2018-09-15 MED ORDER — INSULIN ASPART 100 UNIT/ML ~~LOC~~ SOLN: 0.0000 [IU] | Freq: Three times a day (TID) | SUBCUTANEOUS | 11 refills | Status: DC

## 2018-09-15 MED ORDER — ADULT MULTIVITAMIN W/MINERALS CH
1.0000 | ORAL_TABLET | Freq: Every day | ORAL | Status: DC
  Administered 2018-09-16 – 2018-09-17 (×2): 1 via ORAL
  Filled 2018-09-15 (×2): qty 1

## 2018-09-15 MED ORDER — INSULIN ASPART 100 UNIT/ML ~~LOC~~ SOLN
0.0000 [IU] | Freq: Every day | SUBCUTANEOUS | Status: DC
  Administered 2018-09-15: 20:00:00 5 [IU] via SUBCUTANEOUS
  Filled 2018-09-15: qty 1

## 2018-09-15 MED ORDER — ACETAMINOPHEN 325 MG PO TABS
650.0000 mg | ORAL_TABLET | Freq: Four times a day (QID) | ORAL | Status: DC | PRN
  Administered 2018-09-16 – 2018-09-17 (×2): 650 mg via ORAL
  Filled 2018-09-15 (×2): qty 2

## 2018-09-15 MED ORDER — ADULT MULTIVITAMIN W/MINERALS CH: 1.0000 | ORAL_TABLET | Freq: Every day | ORAL | 2 refills | Status: DC

## 2018-09-15 MED ORDER — SENNOSIDES-DOCUSATE SODIUM 8.6-50 MG PO TABS: 2.0000 | ORAL_TABLET | Freq: Every day | ORAL | 2 refills | Status: DC

## 2018-09-15 MED ORDER — ENSURE MAX PROTEIN PO LIQD
11.0000 [oz_av] | Freq: Two times a day (BID) | ORAL | Status: DC
  Administered 2018-09-16 – 2018-09-17 (×2): 11 [oz_av] via ORAL
  Filled 2018-09-15: qty 330

## 2018-09-15 MED ORDER — ESCITALOPRAM OXALATE 20 MG PO TABS: 20.0000 mg | ORAL_TABLET | Freq: Every day | ORAL | 2 refills | Status: DC

## 2018-09-15 MED ORDER — TRAZODONE HCL 100 MG PO TABS
300.0000 mg | ORAL_TABLET | Freq: Every day | ORAL | Status: DC
  Administered 2018-09-15 – 2018-09-16 (×2): 300 mg via ORAL
  Filled 2018-09-15 (×2): qty 3

## 2018-09-15 MED ORDER — INSULIN GLARGINE 100 UNIT/ML ~~LOC~~ SOLN
52.0000 [IU] | Freq: Every day | SUBCUTANEOUS | Status: DC
  Administered 2018-09-15 – 2018-09-16 (×2): 52 [IU] via SUBCUTANEOUS
  Filled 2018-09-15 (×3): qty 0.52

## 2018-09-15 NOTE — Discharge Instructions (Signed)
--  Transfer to inpatient psychiatric service--further management by psychiatrist --Insulin regimen as ordered including sliding scale - NovoLog insulin sliding scale 0-15 Units, Subcutaneous, 3 times daily with meals, First dose on Mon 09/13/18 at 1200 Correction coverage: Moderate (average weight, post-op) CBG < 70: Implement Hypoglycemia Standing Orders and refer to Hypoglycemia Standing Orders sidebar report CBG 70 - 120: 0 units CBG 121 - 150: 2 units CBG 151 - 200: 3 units CBG 201 - 250: 5 units CBG 251 - 300: 8 units CBG 301 - 350: 11 units CBG 351 - 400: 15 units CBG > 400: call MD

## 2018-09-15 NOTE — Progress Notes (Signed)
  Referral was made to [facility] on 09/14/2018. Contacted [facility], spoke with Heather to follow up on referral. They have asked [facility] to review for possible placement. Nira Conn will follow up and call CSW back.   Thurmond Butts, MSW, [place] Social Worker [phone]

## 2018-09-15 NOTE — Progress Notes (Signed)
Inpatient Diabetes Program Recommendations  AACE/ADA: New Consensus Statement on Inpatient Glycemic Control (2015)  Target Ranges:  Prepandial:   less than 140 mg/dL      Peak postprandial:   less than 180 mg/dL (1-2 hours)      Critically ill patients:  140 - 180 mg/dL   Lab Results  Component Value Date   GLUCAP 226 (H) 09/15/2018   HGBA1C 12.9 (A) 07/13/2018    Review of Glycemic Control Results for TIMICA, MARCOM (MRN [id number]) as of 09/15/2018 08:07  Ref. Range 09/14/2018 08:33 09/14/2018 12:38 09/14/2018 16:30 09/14/2018 20:12 09/15/2018 00:44  Glucose-Capillary Latest Ref Range: 70 - 99 mg/dL 242 (H) 133 (H) 101 (H) 339 (H) 226 (H)   Diabetes history: DM1 Outpatient Diabetes medications: Lantus 60 units daily Current orders for Inpatient glycemic control: Lantus 48 units daily + Novolog moderate correction tid + hs 0-5 units + Novolog 5 units tid meal coverage  Inpatient Diabetes Program Recommendations:     Basal insulin increased to 80% of home dose yesterday. Fasting glucose still elevated in the low 200 range.  Consider increasing Lantus to 52-54 units.   Thanks,  Tama Headings RN, MSN, BC-ADM Inpatient Diabetes Coordinator Team Pager [phone] (8a-5p)

## 2018-09-15 NOTE — Plan of Care (Signed)
  Problem: Education: Goal: Knowledge of [facility] Education information/materials will improve Outcome: Progressing Patient instructed on  Information  unit programing, able to verbalize understanding

## 2018-09-15 NOTE — TOC Transition Note (Signed)
Transition of Care [facility]) - CM/SW Discharge Note   Patient Details  Name: Nancy [name] MRN: [id number] Date of Birth: 10-29-2000  Transition of Care [facility]) CM/SW Contact:  Vinie Sill, [place] Phone Number: 09/15/2018, 4:46 PM   Clinical Narrative:    Patient will discharge : [facility] Accepting MD: [place]:  Bed # 309 Report: [id number] Transportation arranged: Richrd Prime, MSW, [facility] Clinical Social Worker [phone]         Patient Goals and CMS Choice        Discharge Placement                       Discharge Plan and Services                                     Social Determinants of Health (SDOH) Interventions     Readmission Risk Interventions Readmission Risk Prevention Plan 08/05/2018 06/15/2018  Transportation Screening Complete Complete  PCP or Specialist Appt within 3-5 Days Complete -  Social Work Consult for [place] Planning/Counseling Not Complete -  SW consult not completed comments no needs -  Palliative Care Screening Not Applicable -  Medication Review Press photographer) Referral to Pharmacy Complete  PCP or Specialist appointment within 3-5 days of discharge - Complete  HRI or [place] - Complete  SW Recovery Care/Counseling Consult - Complete  [place] - Not Applicable  Some recent data might be hidden

## 2018-09-15 NOTE — Discharge Summary (Signed)
Nancy [name], is a 18 y.o. female  DOB 2000-09-25  MRN [id number].  Admission date:  09/12/2018  Admitting Physician  Etta Quill, DO  Discharge Date:  09/15/2018   Primary MD  [name], NP  Recommendations for primary care physician for things to follow:     --Transfer to inpatient psychiatric service--further management by psychiatrist --Insulin regimen as ordered including sliding scale - NovoLog insulin sliding scale 0-15 Units, Subcutaneous, 3 times daily with meals, First dose on Mon 09/13/18 at 1200 Correction coverage: Moderate (average weight, post-op) CBG < 70: Implement Hypoglycemia Standing Orders and refer to Hypoglycemia Standing Orders sidebar report CBG 70 - 120: 0 units CBG 121 - 150: 2 units CBG 151 - 200: 3 units CBG 201 - 250: 5 units CBG 251 - 300: 8 units CBG 301 - 350: 11 units CBG 351 - 400: 15 units CBG > 400: call MD  Admission Diagnosis  Suicidal ideation [R45.851] Tachycardia [R00.0] AKI (acute kidney injury) ([place]) [N17.9] Diabetic ketoacidosis without coma associated with type 2 diabetes mellitus ([place]) [E11.10]  Discharge Diagnosis  Suicidal ideation [R45.851] Tachycardia [R00.0] AKI (acute kidney injury) ([place]) [N17.9] Diabetic ketoacidosis without coma associated with type 2 diabetes mellitus ([place]) [E11.10]    Principal Problem:   MDD (major depressive disorder), recurrent severe, without psychosis ([place]) Active Problems:   Non compliance with medical treatment   Sinus tachycardia by electrocardiogram   Chronic abdominal pain   DKA, type 1 ([place])   Suicidal ideations     Past Medical History:  Diagnosis Date   Acanthosis nigricans    Anxiety    CHF (congestive heart failure) (HCC)    Chronic lower back pain    Depression    Dyspepsia    Obesity    Ovarian cyst    pt is not aware of this hx (11/24/2017)   Pre-diabetes    Precocious  adrenarche (HCC)    Premature baby    Seizures (HCC)    Type II diabetes mellitus (HCC)    insulin dependant    Past Surgical History:  Procedure Laterality Date   ABDOMINAL HERNIA REPAIR     "I was a baby"   HERNIA REPAIR     TONSILLECTOMY AND ADENOIDECTOMY     WISDOM TOOTH EXTRACTION  2017     HPI  from the history and physical done on the day of admission:    - Chief Complaint: GIB, hyperglycemia, suicidal  HPI: Nancy [name] is a 18 y.o. 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  Patient presents to ED via EMS with multiple complaints:  First she reports black tarry stool for the past 2 days, frequent episodes of vomiting, some of the vomiting episodes are bright red, and some dark, has diffuse abd pain and progressive SOB over past few days.  Second, she reports having suicidal thoughts ongoing for past several months.  Third she has elevated blood  sugars.  No fevers, chills, back pain, dysuria, vaginal bleeding, hematuria, chest pain, palpitations.  ED Course: Tachycardic to 140s.  Mild DKA with 20 keytones in urine, AG of 16.  BNP nl  WBC 11.7.  UDS neg, tylenol and salicylate levels normal.  Ph 7.51 on ABG, O2 sat 99%  Mildly hyperventilating in the upper 20s.  Had a negative PE study x2 days ago on 9/4.  COVID test pending, CXR pending.  Review of Systems: As per HPI, otherwise all review of systems negative.    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, anxiety and depression admitted on 09/13/2018 with multiple psychosomatic complaints and found to have mild DKA with anion gap of 16 -DKA symptomatology has since resolved -Patient is medically stable at this time awaiting  transfer to inpatient psychiatric unit  A/p 1)Depressive disorder with suicidal ideation-- Pt is medicallystable --Medically cleared, --Okay to transfer to inpatient psychiatric services if bedisavailable -Psychiatric consult appreciated -Psychiatrist recommends IVC if patient refuses to go to psychiatric facility voluntarily -Continue Lexapro 20 mg daily, Prozac 20 mg daily, Lamictal 25 mg twice daily, Seroquel 400 mg nightly and Topamax 25 mg twice daily as well as trazodone 300 mg nightly -Watch for possible excessive sedation  2)DM1-recent A1c is 12.9, overall poorly controlled, DKA resolved -Increase Lantus to 52 units daily,  Use Novolog/Humalog Sliding scale insulin with Accu-Cheks/Fingersticks as ordered  3)Constipation--- No BM in at least 4 days, lactulose given x1, placed on daily MiraLAX and Senokot-S nightly        Code Status : Full  Family Communication:   NA (patient is alert, awake and coherent)   Disposition Plan  :   transfer to inpatient psychiatric unit  Consults  :  psych   Discharge Condition: Medically stable Diet and Activity recommendation:  As advised  Discharge Instructions    Discharge Instructions    Call MD for:  difficulty breathing, headache or visual disturbances   Complete by: As directed    Call MD for:  persistant dizziness or light-headedness   Complete by: As directed    Call MD for:  persistant nausea and vomiting   Complete by: As directed    Call MD for:  severe uncontrolled pain   Complete by: As directed    Call MD for:  temperature >100.4   Complete by: As directed    Diet - low sodium heart healthy   Complete by: As directed    Discharge instructions   Complete by: As directed    --Transfer to inpatient psychiatric service--further management by psychiatrist --Insulin regimen as ordered including sliding scale - NovoLog insulin sliding scale 0-15 Units, Subcutaneous, 3 times daily with meals, First dose on Mon  09/13/18 at 1200 Correction coverage: Moderate (average weight, post-op) CBG < 70: Implement Hypoglycemia Standing Orders and refer to Hypoglycemia Standing Orders sidebar report CBG 70 - 120: 0 units CBG 121 - 150: 2 units CBG 151 - 200: 3 units CBG 201 - 250: 5 units CBG 251 - 300: 8 units CBG 301 - 350: 11 units CBG 351 - 400: 15 units CBG > 400: call MD   Increase activity slowly   Complete by: As directed         Discharge Medications     Allergies as of 09/15/2018      Reactions   Ibuprofen Other (See Comments)   GI MD said to  not take this anymore      Medication List    STOP taking these medications   diclofenac sodium 1 % Gel Commonly known as: VOLTAREN   glucose blood test strip Commonly known as: True Metrix Blood Glucose Test   insulin lispro 100 UNIT/ML injection Commonly known as: HUMALOG   Insulin Pen Needle 31G X 5 MM Misc Commonly known as: B-D UF III MINI PEN NEEDLES   meloxicam 7.5 MG tablet Commonly known as: MOBIC   trazodone 300 MG tablet Commonly known as: DESYREL   True Metrix Meter w/Device Kit   TRUEplus Lancets 28G Misc     TAKE these medications   acetaminophen 325 MG tablet Commonly known as: TYLENOL Take 2 tablets (650 mg total) by mouth every 6 (six) hours as needed for mild pain or headache.   atorvastatin 20 MG tablet Commonly known as: Lipitor Take 1 tablet (20 mg total) by mouth daily.   escitalopram 20 MG tablet Commonly known as: LEXAPRO Take 1 tablet (20 mg total) by mouth daily. Start taking on: September 16, 2018   FLUoxetine 20 MG capsule Commonly known as: PROZAC Take 1 capsule (20 mg total) by mouth at bedtime. Please allow patient to pick up 30 day supply for free   gabapentin 300 MG capsule Commonly known as: NEURONTIN Take 1 capsule (300 mg total) by mouth at bedtime for 30 days.   glucagon 1 MG injection Use for Severe Hypoglycemia . Inject 1 mg intramuscularly if unresponsive, unable to swallow,  unconscious and/or has seizure What changed:   how much to take  how to take this  when to take this  reasons to take this  additional instructions   insulin aspart 100 UNIT/ML injection Commonly known as: novoLOG Inject 0-15 Units into the skin 3 (three) times daily with meals. 0-15 Units Subcutaneous, 3 times daily with meals, CBG 70 - 120: 0 units CBG 121 - 150: 2 units CBG 151 - 200: 3 units CBG 201 - 250: 5 units CBG 251 - 300: 8 units CBG 301 - 350: 11 units CBG 351 - 400: 15 units CBG > 400: call MD   insulin glargine 100 UNIT/ML injection Commonly known as: LANTUS Inject 0.52 mLs (52 Units total) into the skin daily. Start taking on: September 16, 2018 What changed:   how much to take  when to take this   lamoTRIgine 25 MG tablet Commonly known as: LAMICTAL Take 1 tablet (25 mg total) by mouth 2 (two) times daily.   multivitamin with minerals Tabs tablet Take 1 tablet by mouth daily. Start taking on: September 16, 2018   polyethylene glycol 17 g packet Commonly known as: MIRALAX / GLYCOLAX Take 17 g by mouth daily.   prochlorperazine 10 MG tablet Commonly known as: COMPAZINE Take 1 tablet (10 mg total) by mouth 2 (two) times daily as needed for nausea or vomiting.   propranolol 20 MG tablet Commonly known as: INDERAL Take 1 tablet (20 mg total) by mouth 2 (two) times daily. Please allow patient to pick up 30 day supply for free   QUEtiapine 100 MG tablet Commonly known as: SEROQUEL Take 400 mg by mouth at bedtime.   senna-docusate 8.6-50 MG tablet Commonly known as: Senokot-S Take 2 tablets by mouth at bedtime.   topiramate 25 MG tablet Commonly known as: Topamax Take 1 tablet (25 mg total) by mouth 2 (two) times daily. Please allow patient to pick up 30 day supply for free  Major procedures and Radiology Reports - PLEASE review detailed and final reports for all details, in brief -     Dg Chest 2 View  Result Date: 09/13/2018 CLINICAL  DATA:  Chest pain EXAM: CHEST - 2 VIEW COMPARISON:  September 10, 2018 FINDINGS: The heart size and mediastinal contours are within normal limits. Both lungs are clear. The visualized skeletal structures are unremarkable. IMPRESSION: No acute cardiopulmonary disease. Electronically Signed   By: Prudencio Pair M.D.   On: 09/13/2018 02:16   Dg Chest 2 View  Result Date: 09/10/2018 CLINICAL DATA:  Left-sided chest pain with shortness of breath EXAM: CHEST - 2 VIEW COMPARISON:  09/03/2018 FINDINGS: The heart size and mediastinal contours are within normal limits. Both lungs are clear. The visualized skeletal structures are unremarkable. IMPRESSION: No active cardiopulmonary disease. Electronically Signed   By: Davina Poke M.D.   On: 09/10/2018 15:09   Dg Chest 2 View  Result Date: 09/03/2018 CLINICAL DATA:  18 year old female with left upper chest pain radiating to the back for 1 week. EXAM: CHEST - 2 VIEW COMPARISON:  Chest radiographs 07/19/2018 and earlier. FINDINGS: AP and lateral views. Mildly low lung volumes. Mediastinal contours remain normal. Visualized tracheal air column is within normal limits. Both lungs appear clear. No pneumothorax or pleural effusion. No acute osseous abnormality identified. Negative visible bowel gas pattern. IMPRESSION: Negative.  No cardiopulmonary abnormality. Electronically Signed   By: Genevie Ann M.D.   On: 09/03/2018 17:14   Ct Angio Chest Pe W And/or Wo Contrast  Result Date: 09/10/2018 CLINICAL DATA:  18 year old female with shortness of breath and high pretest probability for pulmonary embolism. EXAM: CT ANGIOGRAPHY CHEST WITH CONTRAST TECHNIQUE: Multidetector CT imaging of the chest was performed using the standard protocol during bolus administration of intravenous contrast. Multiplanar CT image reconstructions and MIPs were obtained to evaluate the vascular anatomy. CONTRAST:  15m OMNIPAQUE IOHEXOL 350 MG/ML SOLN COMPARISON:  Prior CT scan of the heart 01/14/2018  FINDINGS: Cardiovascular: Adequate opacification of the pulmonary arteries to the segmental level. No evidence of central filling defect to suggest acute pulmonary embolus. The main pulmonary artery is normal in size. 2 vessel aortic arch. The right brachiocephalic and left common carotid artery share a common origin. No evidence of aneurysm or dissection. The heart is normal in size. No pericardial effusion. Mediastinum/Nodes: Unremarkable thyroid gland. 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 No suspicious lymphadenopathy. Unremarkable esophagus. Lungs/Pleura: Lungs are clear. No pleural effusion or pneumothorax. Upper Abdomen: No acute abnormality. Musculoskeletal: No acute fracture or aggressive appearing lytic or blastic osseous lesion. Review of the MIP images confirms the above findings. IMPRESSION: 1. Negative for acute pulmonary embolus, pneumonia or other acute cardiopulmonary process. Electronically Signed   By: HJacqulynn CadetM.D.   On: 09/10/2018 16:24   UKoreaAbdomen Limited  Result Date: 09/06/2018 CLINICAL DATA:  Right upper quadrant and mid abdominal pain, nonacute. History of hernia surgery as a child. EXAM: ULTRASOUND ABDOMEN LIMITED RIGHT UPPER QUADRANT COMPARISON:  Ultrasound 01/26/2018. FINDINGS: Gallbladder: The gallbladder is incompletely distended. No gallstones or wall thickening visualized. No sonographic Murphy sign noted by sonographer. Common bile duct: Diameter: 4 mm Liver: The appendix echogenicity appears mildly increased. No focal abnormality identified. Portal vein is patent on color Doppler imaging with normal direction of blood flow towards the liver. Other: None. IMPRESSION: No acute findings or evidence of biliary disease. Mildly increased hepatic echogenicity without focal abnormality. Electronically Signed   By: WGwyndolyn Saxon  Lin Landsman M.D.   On: 09/06/2018 17:19    Micro Results   Recent Results (from the past 240 hour(s))   SARS CORONAVIRUS 2 (TAT 6-24 HRS) Nasopharyngeal Nasopharyngeal Swab     Status: None   Collection Time: 09/13/18  1:09 AM   Specimen: Nasopharyngeal Swab  Result Value Ref Range Status   SARS Coronavirus 2 NEGATIVE NEGATIVE Final    Comment: (NOTE) SARS-CoV-2 target nucleic acids are NOT DETECTED. The SARS-CoV-2 RNA is generally detectable in upper and lower respiratory specimens during the acute phase of infection. Negative results do not preclude SARS-CoV-2 infection, do not rule out co-infections with other pathogens, and should not be used as the sole basis for treatment or other patient management decisions. Negative results must be combined with clinical observations, patient history, and epidemiological information. The expected result is Negative. Fact Sheet for Patients: SugarRoll.be Fact Sheet for Healthcare Providers: https://www.woods-mathews.com/ This test is not yet approved or cleared by the [place] FDA and  has been authorized for detection and/or diagnosis of SARS-CoV-2 by FDA under an Emergency Use Authorization (EUA). This EUA will remain  in effect (meaning this test can be used) for the duration of the COVID-19 declaration under Section 56 4(b)(1) of the Act, 21 U.S.C. section 360bbb-3(b)(1), unless the authorization is terminated or revoked sooner. Performed at [facility], [place] [address], [place], [place] [address]   MRSA PCR Screening     Status: None   Collection Time: 09/13/18  8:28 AM   Specimen: Nasal Mucosa; Nasopharyngeal  Result Value Ref Range Status   MRSA by PCR NEGATIVE NEGATIVE Final    Comment:        The GeneXpert MRSA Assay (FDA approved for NASAL specimens only), is one component of a comprehensive MRSA colonization surveillance program. It is not intended to diagnose MRSA infection nor to guide or monitor treatment for MRSA infections. Performed at [facility],  [place] [address] [place], [place] [address]        Today   Subjective    Nancy [name] today has no complaints, eating or drinking well,  -No BM in 4 days, lactulose given          Patient has been seen and examined prior to discharge   Objective   Blood pressure 117/74, pulse (!) 106, temperature 98.6 F (37 C), temperature source Oral, resp. rate (!) 28, height 5' 3" (1.6 m), weight 88.3 kg, last menstrual period 08/30/2018, SpO2 99 %.   Intake/Output Summary (Last 24 hours) at 09/15/2018 1657 Last data filed at 09/15/2018 0843 Gross per 24 hour  Intake 720 ml  Output 2 ml  Net 718 ml    Exam Gen:- Awake Alert, no acute distress  HEENT:- [place].AT, No sclera icterus Neck-Supple Neck,No JVD,.  Lungs-  CTAB , good air movement bilaterally  CV- S1, S2 normal, regular Abd-  +ve B.Sounds, Abd Soft, No tenderness,    Extremity/Skin:- No  edema,   good pulses Psych-affect is appropriate, oriented x3 Neuro-generalized weakness, no new focal deficits, no tremors    Data Review   CBC w Diff:  Lab Results  Component Value Date   WBC 10.8 (H) 09/13/2018   HGB 12.6 09/13/2018   HGB 14.4 12/28/2017   HCT 38.3 09/13/2018   HCT 44.8 12/28/2017   PLT 324 09/13/2018   PLT 407 12/28/2017   LYMPHOPCT 22 09/10/2018   MONOPCT 9 09/10/2018   EOSPCT 1 09/10/2018   BASOPCT 1 09/10/2018  CMP:  Lab Results  Component Value Date   NA 134 (L) 09/15/2018   NA 140 12/28/2017   K 3.8 09/15/2018   CL 105 09/15/2018   CO2 22 09/15/2018   BUN 16 09/15/2018   BUN 10 12/28/2017   CREATININE 0.67 09/15/2018   CREATININE 0.54 09/18/2012   PROT 6.8 09/12/2018   ALBUMIN 3.6 09/12/2018   BILITOT 0.7 09/12/2018   ALKPHOS 94 09/12/2018   AST 18 09/12/2018   ALT 14 09/12/2018  .  Total Discharge time is about 33 minutes  Roxan Hockey M.D on 09/15/2018 at 4:57 PM  Go to [email or web address] -  for contact info  [facility] - Office  [phone]

## 2018-09-15 NOTE — BH Assessment (Signed)
Patient is to be admitted to [facility] by Psychiatric Nurse Practitioner [facility].  Attending Physician will be Dr. Weber Cooks.   Patient has been assigned to room 309, by Octa F Call report to [phone].  Representative/Transfer Coordinator is Dispensing optician Patient pre-admitted by [facility] Patient Access Gust Rung.,)  [facility] Staff Caren Griffins, Social Worker) made aware of acceptance.

## 2018-09-15 NOTE — Tx Team (Signed)
Initial Treatment Plan 09/15/2018 7:11 PM Nancy [name] QHU:[id number]    PATIENT STRESSORS: Health problems Loss of father    PATIENT STRENGTHS: Ability for insight Active sense of humor Average or above average intelligence Capable of independent living Communication skills Supportive family/friends   PATIENT IDENTIFIED PROBLEMS: Depression 09/15/2018  Grief 09/15/2018                   DISCHARGE CRITERIA:  Ability to meet basic life and health needs Adequate post-discharge living arrangements Improved stabilization in mood, thinking, and/or behavior  PRELIMINARY DISCHARGE PLAN: Outpatient therapy Return to previous living arrangement  PATIENT/FAMILY INVOLVEMENT: This treatment plan has been presented to and reviewed with the patient, Nancy [name], and/or family member,   The patient and family have been given the opportunity to ask questions and make suggestions.  Leodis Liverpool, RN 09/15/2018, 7:11 PM

## 2018-09-15 NOTE — Progress Notes (Signed)
Patient pleasant and cooperative during admission assessment. Patient denies SI/HI at this time. Patient denies AVH. Patient informed of fall risk status, fall risk assessed "high"  as patient had fall with H/O seizure.Patient oriented to unit/staff/room. Patient denies any questions/concerns at this time. Patient safe on unit with Q15 minute checks for safety.Skin assessment and body search done,no contraband found.

## 2018-09-16 DIAGNOSIS — F445 Conversion disorder with seizures or convulsions: Secondary | ICD-10-CM

## 2018-09-16 DIAGNOSIS — F332 Major depressive disorder, recurrent severe without psychotic features: Principal | ICD-10-CM

## 2018-09-16 LAB — GLUCOSE, CAPILLARY
Glucose-Capillary: 122 mg/dL — ABNORMAL HIGH (ref 70–99)
Glucose-Capillary: 171 mg/dL — ABNORMAL HIGH (ref 70–99)
Glucose-Capillary: 167 mg/dL — ABNORMAL HIGH (ref 70–99)
Glucose-Capillary: 163 mg/dL — ABNORMAL HIGH (ref 70–99)

## 2018-09-16 MED ORDER — ARIPIPRAZOLE 10 MG PO TABS
10.0000 mg | ORAL_TABLET | Freq: Every day | ORAL | Status: DC
  Administered 2018-09-16 – 2018-09-17 (×2): 10 mg via ORAL
  Filled 2018-09-16 (×2): qty 1

## 2018-09-16 MED ORDER — FLUOXETINE HCL 20 MG PO CAPS
40.0000 mg | ORAL_CAPSULE | Freq: Every day | ORAL | Status: DC
  Administered 2018-09-16: 22:00:00 40 mg via ORAL
  Filled 2018-09-16: qty 2

## 2018-09-16 MED ORDER — ALUM & MAG HYDROXIDE-SIMETH 200-200-20 MG/5ML PO SUSP
30.0000 mL | Freq: Once | ORAL | Status: AC
  Administered 2018-09-16: 17:00:00 30 mL via ORAL
  Filled 2018-09-16: qty 30

## 2018-09-16 MED ORDER — FAMOTIDINE 20 MG PO TABS
20.0000 mg | ORAL_TABLET | Freq: Once | ORAL | Status: AC
  Administered 2018-09-16: 17:00:00 20 mg via ORAL
  Filled 2018-09-16: qty 1

## 2018-09-16 MED ORDER — ACETAMINOPHEN 500 MG PO TABS
1000.0000 mg | ORAL_TABLET | Freq: Once | ORAL | Status: AC
  Administered 2018-09-16: 17:00:00 1000 mg via ORAL
  Filled 2018-09-16 (×2): qty 2

## 2018-09-16 MED FILL — !LANTUS 100 UNITS/ML VIAL: 100 | 38 days supply | Qty: 20 | Fill #0

## 2018-09-16 MED FILL — ESCITALOPRAM 20 MG TABLET: 20 | 30 days supply | Qty: 30 | Fill #0

## 2018-09-16 NOTE — BHH Suicide Risk Assessment (Signed)
[facility] Admission Suicide Risk Assessment   Nursing information obtained from:  Patient Demographic factors:  Adolescent or young adult Current Mental Status:  NA Loss Factors:  NA Historical Factors:  NA Risk Reduction Factors:  Living with another person, especially a relative, Positive coping skills or problem solving skills  Total Time spent with patient: 1 hour Principal Problem: MDD (major depressive disorder), recurrent severe, without psychosis ([place]) Diagnosis:  Principal Problem:   MDD (major depressive disorder), recurrent severe, without psychosis ([place]) Active Problems:   Hypertension   Dyspepsia   Diabetes type 2, uncontrolled ([place])   Generalized abdominal pain   Chronic abdominal pain   Conversion disorder with attacks or seizures, acute episode, with psychological stressor  Subjective Data: Patient seen and chart reviewed this is a young woman transferred from [facility] for depression with suicidal ideation.  On interview today she denies any suicidal intent or plan.  Denies any acute psychotic symptoms.  Does report chronic depression and anxiety with recent worsening.  Poor sleep poor appetite and a lot of somatic symptoms.  Has been compliant with prescribed psychiatric medicine.  Continued Clinical Symptoms:  Alcohol Use Disorder Identification Test Final Score (AUDIT): 1 The "Alcohol Use Disorders Identification Test", Guidelines for Use in Primary Care, Second Edition.  World Pharmacologist [facility]). Score between 0-7:  no or low risk or alcohol related problems. Score between 8-15:  moderate risk of alcohol related problems. Score between 16-19:  high risk of alcohol related problems. Score 20 or above:  warrants further diagnostic evaluation for alcohol dependence and treatment.   CLINICAL FACTORS:   Depression:   Anhedonia Medical Diagnoses and Treatments/Surgeries   Musculoskeletal: Strength & Muscle Tone: within normal limits Gait & Station:  normal Patient leans: N/A  Psychiatric Specialty Exam: Physical Exam  Nursing note and vitals reviewed. Constitutional: She appears well-developed and well-nourished.  HENT:  Head: Normocephalic and atraumatic.  Eyes: Pupils are equal, round, and reactive to light. Conjunctivae are normal.  Neck: Normal range of motion.  Cardiovascular: Regular rhythm and normal heart sounds.  Respiratory: Effort normal.  GI: Soft.  Musculoskeletal: Normal range of motion.  Neurological: She is alert.  Skin: Skin is warm and dry.  Psychiatric: Her speech is normal. Judgment normal. Her affect is blunt. She is not agitated and not aggressive. Thought content is not paranoid. Cognition and memory are normal. She exhibits a depressed mood. She expresses no homicidal and no suicidal ideation.    Review of Systems  Constitutional: Negative.   HENT: Negative.   Eyes: Negative.   Respiratory: Negative.   Cardiovascular: Negative.   Gastrointestinal: Negative.   Musculoskeletal: Negative.   Skin: Negative.   Neurological: Negative.   Psychiatric/Behavioral: Positive for depression and suicidal ideas. Negative for hallucinations and substance abuse. The patient is nervous/anxious and has insomnia.     Blood pressure 123/85, pulse 84, temperature 98.9 F (37.2 C), temperature source Oral, resp. rate 18, height 5' 3"  (1.6 m), weight 89.4 kg, last menstrual period 08/30/2018, SpO2 100 %.Body mass index is 34.9 kg/m.  General Appearance: Casual  Eye Contact:  Fair  Speech:  Slow  Volume:  Decreased  Mood:  Depressed  Affect:  Congruent  Thought Process:  Goal Directed  Orientation:  Full (Time, Place, and Person)  Thought Content:  Logical  Suicidal Thoughts:  No  Homicidal Thoughts:  No  Memory:  Immediate;   Fair Recent;   Fair Remote;   Fair  Judgement:  Impaired  Insight:  Shallow  Psychomotor Activity:  Decreased  Concentration:  Concentration: Fair  Recall:  [place] of Knowledge:   Good  Language:  Fair  Akathisia:  No  Handed:  Right  AIMS (if indicated):     Assets:  Desire for Improvement Housing Resilience Social Support  ADL's:  Intact  Cognition:  WNL  Sleep:  Number of Hours: 6.5      COGNITIVE FEATURES THAT CONTRIBUTE TO RISK:  Thought constriction (tunnel vision)    SUICIDE RISK:   Mild:  Suicidal ideation of limited frequency, intensity, duration, and specificity.  There are no identifiable plans, no associated intent, mild dysphoria and related symptoms, good self-control (both objective and subjective assessment), few other risk factors, and identifiable protective factors, including available and accessible social support.  PLAN OF CARE: Patient will be on 15-minute checks.  Discussed medication changes with her.  We will make some medication changes.  Engage her in individual and group therapy.  She met with treatment team today.  Reassess safety and discharge follow-up plans prior to discharge.  I certify that inpatient services furnished can reasonably be expected to improve the patient's condition.   Alethia Berthold, MD 09/16/2018, 4:40 PM

## 2018-09-16 NOTE — Tx Team (Addendum)
Interdisciplinary Treatment and Diagnostic Plan Update  09/16/2018 Time of Session: 900am Nancy [name] MRN: [id number]  Principal Diagnosis: <principal problem not specified>  Secondary Diagnoses: Active Problems:   MDD (major depressive disorder), recurrent severe, without psychosis ([place])   Current Medications:  Current Facility-Administered Medications  Medication Dose Route Frequency Provider Last Rate Last Dose  . acetaminophen (TYLENOL) tablet 650 mg  650 mg Oral Q6H PRN Money, Lowry Ram, FNP      . alum & mag hydroxide-simeth (MAALOX/MYLANTA) 200-200-20 MG/5ML suspension 30 mL  30 mL Oral Q4H PRN Money, Lowry Ram, FNP      . atorvastatin (LIPITOR) tablet 20 mg  20 mg Oral q1800 Money, Lowry Ram, FNP      . diclofenac sodium (VOLTAREN) 1 % transdermal gel 2 g  2 g Topical TID PRN Money, Lowry Ram, FNP      . escitalopram (LEXAPRO) tablet 20 mg  20 mg Oral Daily Money, Lowry Ram, FNP   20 mg at 09/16/18 0742  . FLUoxetine (PROZAC) capsule 20 mg  20 mg Oral QHS Money, Lowry Ram, FNP   20 mg at 09/15/18 2146  . hydrOXYzine (ATARAX/VISTARIL) tablet 25 mg  25 mg Oral TID PRN Money, Lowry Ram, FNP   25 mg at 09/15/18 2017  . insulin aspart (novoLOG) injection 0-15 Units  0-15 Units Subcutaneous TID WC Money, Lowry Ram, FNP   2 Units at 09/16/18 0746  . insulin aspart (novoLOG) injection 0-5 Units  0-5 Units Subcutaneous QHS Money, Lowry Ram, FNP   5 Units at 09/15/18 2019  . insulin aspart (novoLOG) injection 5 Units  5 Units Subcutaneous TID WC Money, Lowry Ram, FNP   5 Units at 09/16/18 0741  . insulin glargine (LANTUS) injection 52 Units  52 Units Subcutaneous Daily Money, Lowry Ram, [place]   52 Units at 09/15/18 2144  . lamoTRIgine (LAMICTAL) tablet 25 mg  25 mg Oral BID Money, Lowry Ram, FNP   25 mg at 09/16/18 0742  . magnesium hydroxide (MILK OF MAGNESIA) suspension 30 mL  30 mL Oral Daily PRN Money, Lowry Ram, FNP      . multivitamin with minerals tablet 1 tablet  1 tablet Oral Daily Money,  Lowry Ram, FNP   1 tablet at 09/16/18 0741  . protein supplement (ENSURE MAX) liquid  11 oz Oral BID Money, Lowry Ram, FNP      . QUEtiapine (SEROQUEL) tablet 400 mg  400 mg Oral QHS Money, Lowry Ram, FNP   400 mg at 09/15/18 2145  . senna-docusate (Senokot-S) tablet 2 tablet  2 tablet Oral QHS Money, Lowry Ram, FNP   2 tablet at 09/15/18 2146  . topiramate (TOPAMAX) tablet 25 mg  25 mg Oral BID Money, Lowry Ram, FNP   25 mg at 09/16/18 0743  . traMADol (ULTRAM) tablet 50 mg  50 mg Oral Q6H PRN Money, Lowry Ram, FNP      . traZODone (DESYREL) tablet 300 mg  300 mg Oral QHS Money, Lowry Ram, FNP   300 mg at 09/15/18 2146   PTA Medications: Medications Prior to Admission  Medication Sig Dispense Refill Last Dose  . acetaminophen (TYLENOL) 325 MG tablet Take 2 tablets (650 mg total) by mouth every 6 (six) hours as needed for mild pain or headache. 12 tablet 0   . atorvastatin (LIPITOR) 20 MG tablet Take 1 tablet (20 mg total) by mouth daily. 90 tablet 0   . escitalopram (LEXAPRO) 20 MG tablet Take 1 tablet (20 mg  total) by mouth daily. 30 tablet 2   . FLUoxetine (PROZAC) 20 MG capsule Take 1 capsule (20 mg total) by mouth at bedtime. Please allow patient to pick up 30 day supply for free 30 capsule 4   . gabapentin (NEURONTIN) 300 MG capsule Take 1 capsule (300 mg total) by mouth at bedtime for 30 days. 30 capsule 1   . glucagon 1 MG injection Use for Severe Hypoglycemia . Inject 1 mg intramuscularly if unresponsive, unable to swallow, unconscious and/or has seizure (Patient taking differently: Inject 1 mg into the muscle once as needed (for severe hypoglycemia- if unresponsive, unable to swallow, unconscious, and/or has had seizure(s)). ) 2 each 5   . insulin aspart (NOVOLOG) 100 UNIT/ML injection Inject 0-15 Units into the skin 3 (three) times daily with meals. 0-15 Units Subcutaneous, 3 times daily with meals, CBG 70 - 120: 0 units CBG 121 - 150: 2 units CBG 151 - 200: 3 units CBG 201 - 250: 5 units CBG  251 - 300: 8 units CBG 301 - 350: 11 units CBG 351 - 400: 15 units CBG > 400: call MD 10 mL 11   . insulin glargine (LANTUS) 100 UNIT/ML injection Inject 0.52 mLs (52 Units total) into the skin daily. 10 mL 11   . lamoTRIgine (LAMICTAL) 25 MG tablet Take 1 tablet (25 mg total) by mouth 2 (two) times daily. 60 tablet 2   . Multiple Vitamin (MULTIVITAMIN WITH MINERALS) TABS tablet Take 1 tablet by mouth daily. 30 tablet 2   . polyethylene glycol (MIRALAX / GLYCOLAX) 17 g packet Take 17 g by mouth daily. 30 each 2   . prochlorperazine (COMPAZINE) 10 MG tablet Take 1 tablet (10 mg total) by mouth 2 (two) times daily as needed for nausea or vomiting. 20 tablet 0   . propranolol (INDERAL) 20 MG tablet Take 1 tablet (20 mg total) by mouth 2 (two) times daily. Please allow patient to pick up 30 day supply for free 60 tablet 2   . QUEtiapine (SEROQUEL) 100 MG tablet Take 400 mg by mouth at bedtime.      . senna-docusate (SENOKOT-S) 8.6-50 MG tablet Take 2 tablets by mouth at bedtime. 60 tablet 2   . topiramate (TOPAMAX) 25 MG tablet Take 1 tablet (25 mg total) by mouth 2 (two) times daily. Please allow patient to pick up 30 day supply for free 60 tablet 2     Patient Stressors: Health problems Loss of father   Patient Strengths: Ability for insight Active sense of humor Average or above average intelligence Capable of independent living Communication skills Supportive family/friends  Treatment Modalities: Medication Management, Group therapy, Case management,  1 to 1 session with clinician, Psychoeducation, Recreational therapy.   Physician Treatment Plan for Primary Diagnosis: <principal problem not specified> Long Term Goal(s):     Short Term Goals:    Medication Management: Evaluate patient's response, side effects, and tolerance of medication regimen.  Therapeutic Interventions: 1 to 1 sessions, Unit Group sessions and Medication administration.  Evaluation of Outcomes: Not  Met  Physician Treatment Plan for Secondary Diagnosis: Active Problems:   MDD (major depressive disorder), recurrent severe, without psychosis ([place])  Long Term Goal(s):     Short Term Goals:       Medication Management: Evaluate patient's response, side effects, and tolerance of medication regimen.  Therapeutic Interventions: 1 to 1 sessions, Unit Group sessions and Medication administration.  Evaluation of Outcomes: Not Met   RN Treatment Plan for Primary  Diagnosis: <principal problem not specified> Long Term Goal(s): Knowledge of disease and therapeutic regimen to maintain health will improve  Short Term Goals: Ability to remain free from injury will improve, Ability to verbalize feelings will improve, Ability to disclose and discuss suicidal ideas and Ability to identify and develop effective coping behaviors will improve  Medication Management: RN will administer medications as ordered by provider, will assess and evaluate patient's response and provide education to patient for prescribed medication. RN will report any adverse and/or side effects to prescribing provider.  Therapeutic Interventions: 1 on 1 counseling sessions, Psychoeducation, Medication administration, Evaluate responses to treatment, Monitor vital signs and CBGs as ordered, Perform/monitor CIWA, COWS, AIMS and Fall Risk screenings as ordered, Perform wound care treatments as ordered.  Evaluation of Outcomes: Not Met   LCSW Treatment Plan for Primary Diagnosis: <principal problem not specified> Long Term Goal(s): Safe transition to appropriate next level of care at discharge, Engage patient in therapeutic group addressing interpersonal concerns.  Short Term Goals: Engage patient in aftercare planning with referrals and resources, Increase social support, Increase emotional regulation and Increase skills for wellness and recovery  Therapeutic Interventions: Assess for all discharge needs, 1 to 1 time with Social  worker, Explore available resources and support systems, Assess for adequacy in community support network, Educate family and significant other(s) on suicide prevention, Complete Psychosocial Assessment, Interpersonal group therapy.  Evaluation of Outcomes: Not Met   Progress in Treatment: Attending groups: No. Participating in groups: No. Taking medication as prescribed: Yes. Toleration medication: Yes. Family/Significant other contact made: No, will contact:  pt declined Patient understands diagnosis: Yes. Discussing patient identified problems/goals with staff: Yes. Medical problems stabilized or resolved: Yes. Denies suicidal/homicidal ideation: Yes. Issues/concerns per patient self-inventory: No. Other: N/A  New problem(s) identified: No, Describe:  none  New Short Term/Long Term Goal(s): Detox, elimination of AVH/symptoms of psychosis, medication management for mood stabilization; elimination of SI thoughts; development of comprehensive mental wellness/sobriety plan.   Patient Goals:  "Fix my depression and get better"  Discharge Plan or Barriers: SPE pamphlet, Mobile Crisis information, and AA/NA information provided to patient for additional community support and resources. Pt agreeable to referral to [facility] in [place].  Reason for Continuation of Hospitalization: Medication stabilization Suicidal ideation  Estimated Length of Stay: 5-7 days  Recreational Therapy: Patient Stressors: N/A  Patient Goal: Patient will engage in groups without prompting or encouragement from LRT x3 group sessions within 5 recreation therapy group sessions  Attendees: Patient: Nancy [name] 09/16/2018 10:12 AM  Physician: Dr Weber Cooks MD 09/16/2018 10:12 AM  Nursing: Polly Cobia RN 09/16/2018 10:12 AM  RN Care Manager: 09/16/2018 10:12 AM  Social Worker: Minette Brine Moton LCSW 09/16/2018 10:12 AM  Recreational Therapist: Roanna Epley CTRS LRT 09/16/2018 10:12 AM  Other:  Sanjuana Kava LCSW 09/16/2018 10:12 AM  Other: Assunta Curtis LCSW 09/16/2018 10:12 AM  Other: 09/16/2018 10:12 AM    Scribe for Treatment Team: Mariann Laster Moton, LCSW 09/16/2018 10:12 AM

## 2018-09-16 NOTE — Progress Notes (Signed)
Recreation Therapy Notes  Date: 09/16/2018  Time: 9:30 am  Location: Craft room  Behavioral response: Appropriate   Intervention Topic: Goals  Discussion/Intervention:  Group content on today was focused on goals. Patients described what goals are and how they define goals. Individuals expressed how they go about setting goals and reaching them. The group identified how important goals are and if they make short term goals to reach long term goals. Patients described how many goals they work on at a time and what affects them not reaching their goal. Individuals described how much time they put into planning and obtaining their goals. The group participated in the intervention "My Goal Board" and made personal goal boards to help them achieve their goal. Clinical Observations/Feedback:  Patient came to group late due to unknown reasons. Individual was social with peers and staff while participating in group.  Nolen Lindamood LRT/CTRS         Diera Wirkkala 09/16/2018 11:47 AM
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
## 2018-09-16 NOTE — BHH Group Notes (Signed)
LCSW Group Therapy Note  09/16/2018 1:00 PM  Type of Therapy/Topic:  Group Therapy:  Balance in Life  Participation Level:  Active  Description of Group:    This group will address the concept of balance and how it feels and looks when one is unbalanced. Patients will be encouraged to process areas in their lives that are out of balance and identify reasons for remaining unbalanced. Facilitators will guide patients in utilizing problem-solving interventions to address and correct the stressor making their life unbalanced. Understanding and applying boundaries will be explored and addressed for obtaining and maintaining a balanced life. Patients will be encouraged to explore ways to assertively make their unbalanced needs known to significant others in their lives, using other group members and facilitator for support and feedback.  Therapeutic Goals: 1. Patient will identify two or more emotions or situations they have that consume much of in their lives. 2. Patient will identify signs/triggers that life has become out of balance:  3. Patient will identify two ways to set boundaries in order to achieve balance in their lives:  4. Patient will demonstrate ability to communicate their needs through discussion and/or role plays  Summary of Patient Progress: Patient was present in group. Patient shared that she feel unbalanced with school.  Patient shared with others that she supports a work life balance and leaving a job when necessary.    Therapeutic Modalities:   Cognitive Behavioral Therapy Solution-Focused Therapy Assertiveness Training  Assunta Curtis MSW, LCSW 09/16/2018 12:07 PM
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
## 2018-09-16 NOTE — Progress Notes (Signed)
D: Patient stated slept poor last night .Stated appetite fair and energy level low. Stated concentration good . Stated on Depression scale 7 , hopeless 4 and anxiety 5 .( low 0-10 high) Denies suicidal  homicidal ideations  .  No auditory hallucinations  No pain concerns . Appropriate ADL'S. Interacting with peers and staff. Patient is aware of [place] education and unit programing  able to verbalize understanding .  Emotional and mental status  improved . No frustration or anger issues  voiced  able to demonstrate self control . Voice no safety or sleep  concerns .  Working on coping  and decision making . Patient  Noted  With somatic complaints attended Treatment Team  Goal to improve on Depression .   A: Encourage patient participation with unit programming . Instruction  Given on  Medication , verbalize understanding.  R: Voice no other concerns. Staff continue to monitor

## 2018-09-16 NOTE — Progress Notes (Signed)
Patient ID: Nancy [name], female   DOB: 27-Jul-2000, 18 y.o.   MRN: [id number] Patient complaining this afternoon of abdominal pain.  Pain does not sound cardiac like.  Stabbing intermittent feeling it in her stomach as well as her chest.  Patient does not have other symptoms to suggest cardiac pain and has a history of chronic and recurrent abdominal pain.  I have ordered antacid medication and acetaminophen.

## 2018-09-16 NOTE — Plan of Care (Signed)
Patient is aware of [place] education and unit programing  able to verbalize understanding .  Emotional and mental status  improved . No frustration or anger issues  voiced  able to demonstrate self control . Voice no safety or sleep  concerns .  Working on coping  and decision making .   Problem: Coping: Goal: Will verbalize feelings Outcome: Progressing   Problem: Health Behavior/Discharge Planning: Goal: Ability to make decisions will improve Outcome: Progressing   Problem: Activity: Goal: Interest or engagement in leisure activities will improve Outcome: Progressing Goal: Imbalance in normal sleep/wake cycle will improve Outcome: Progressing   Problem: Safety: Goal: Periods of time without injury will increase Outcome: Progressing   Problem: Coping: Goal: Ability to verbalize frustrations and anger appropriately will improve Outcome: Progressing Goal: Ability to demonstrate self-control will improve Outcome: Progressing   Problem: Education: Goal: Emotional status will improve Outcome: Progressing   Problem: Education: Goal: Knowledge of [facility] Education information/materials will improve Outcome: Progressing Goal: Emotional status will improve Outcome: Progressing Goal: Mental status will improve Outcome: Progressing Goal: Verbalization of understanding the information provided will improve Outcome: Progressing

## 2018-09-16 NOTE — Plan of Care (Signed)
  Problem: Education: Goal: Knowledge of [facility] Education information/materials will improve Outcome: Progressing Goal: Emotional status will improve Outcome: Progressing Goal: Mental status will improve Outcome: Progressing Goal: Verbalization of understanding the information provided will improve Outcome: Progressing   Problem: Coping: Goal: Ability to verbalize frustrations and anger appropriately will improve Outcome: Progressing Goal: Ability to demonstrate self-control will improve Outcome: Progressing   Problem: Safety: Goal: Periods of time without injury will increase Outcome: Progressing   Problem: Activity: Goal: Interest or engagement in leisure activities will improve Outcome: Progressing Goal: Imbalance in normal sleep/wake cycle will improve Outcome: Progressing   Problem: Health Behavior/Discharge Planning: Goal: Ability to make decisions will improve Outcome: Progressing   Problem: Coping: Goal: Will verbalize feelings Outcome: Progressing   

## 2018-09-16 NOTE — Progress Notes (Signed)
Patient is adjusting well in the unit, educate  Patient on unit safety and expected behaviors, patient is appropriate , interacting with peers without any issues , complying with her medications with out any side effects , patient voice no concerns and denies SI/HI/AVH encourage sleep , 15 minutes safety checks is done and maintained.no distress.

## 2018-09-17 ENCOUNTER — Ambulatory Visit: Payer: Medicaid Other | Admitting: Physician Assistant

## 2018-09-17 LAB — GLUCOSE, CAPILLARY
Glucose-Capillary: 144 mg/dL — ABNORMAL HIGH (ref 70–99)
Glucose-Capillary: 214 mg/dL — ABNORMAL HIGH (ref 70–99)

## 2018-09-17 MED ORDER — INSULIN GLARGINE 100 UNIT/ML ~~LOC~~ SOLN: 52.0000 [IU] | Freq: Every day | SUBCUTANEOUS | 1 refills | Status: DC

## 2018-09-17 MED ORDER — ADULT MULTIVITAMIN W/MINERALS CH: 1.0000 | ORAL_TABLET | Freq: Every day | ORAL | 1 refills | Status: DC

## 2018-09-17 MED ORDER — TRAZODONE HCL 300 MG PO TABS: 300.0000 mg | ORAL_TABLET | Freq: Every day | ORAL | 1 refills | Status: DC

## 2018-09-17 MED ORDER — HYDROXYZINE HCL 25 MG PO TABS: 25.0000 mg | ORAL_TABLET | Freq: Three times a day (TID) | ORAL | 1 refills | Status: DC | PRN

## 2018-09-17 MED ORDER — FLUOXETINE HCL 40 MG PO CAPS: 40.0000 mg | ORAL_CAPSULE | Freq: Every day | ORAL | 1 refills | Status: DC

## 2018-09-17 MED ORDER — TOPIRAMATE 25 MG PO TABS: 25.0000 mg | ORAL_TABLET | Freq: Two times a day (BID) | ORAL | 1 refills | Status: DC

## 2018-09-17 MED ORDER — LAMOTRIGINE 25 MG PO TABS: 25.0000 mg | ORAL_TABLET | Freq: Two times a day (BID) | ORAL | 1 refills | Status: DC

## 2018-09-17 MED ORDER — POLYETHYLENE GLYCOL 3350 17 G PO PACK: 17.0000 g | PACK | Freq: Every day | ORAL | 2 refills | Status: DC

## 2018-09-17 MED ORDER — ESCITALOPRAM OXALATE 20 MG PO TABS: 20.0000 mg | ORAL_TABLET | Freq: Every day | ORAL | 1 refills | Status: DC

## 2018-09-17 MED ORDER — ATORVASTATIN CALCIUM 20 MG PO TABS: 20.0000 mg | ORAL_TABLET | Freq: Every day | ORAL | 1 refills | Status: DC

## 2018-09-17 MED ORDER — INSULIN ASPART 100 UNIT/ML ~~LOC~~ SOLN: 5.0000 [IU] | Freq: Three times a day (TID) | SUBCUTANEOUS | 1 refills | Status: DC

## 2018-09-17 MED ORDER — MAGNESIUM CITRATE PO SOLN
1.0000 | Freq: Once | ORAL | Status: AC
  Administered 2018-09-17: 14:00:00 1 via ORAL
  Filled 2018-09-17: qty 296

## 2018-09-17 MED ORDER — ARIPIPRAZOLE 10 MG PO TABS: 10.0000 mg | ORAL_TABLET | Freq: Every day | ORAL | 1 refills | Status: DC

## 2018-09-17 NOTE — Discharge Summary (Signed)
Physician Discharge Summary Note  Patient:  Nancy [name] is an 18 y.o., female MRN:  [id number] DOB:  02/17/2000 Patient phone:  [phone] (home)  Patient address:   [address] [place] [address],  Total Time spent with patient: 45 minutes  Date of Admission:  09/15/2018 Date of Discharge: September 17, 2018  Reason for Admission: Transferred from [place] where she had been stabilized for an episode of diabetic ketoacidosis before sent here for psychiatric admission because of some suicidal ideation.  Principal Problem: MDD (major depressive disorder), recurrent severe, without psychosis (HCC) Discharge Diagnoses: Principal Problem:   MDD (major depressive disorder), recurrent severe, without psychosis (HCC) Active Problems:   Hypertension   Dyspepsia   Diabetes type 2, uncontrolled (HCC)   Generalized abdominal pain   Chronic abdominal pain   Conversion disorder with attacks or seizures, acute episode, with psychological stressor   Past Psychiatric History: History of recurrent episodes of depression possible atypical bipolar disorder possible PTSD chronic anxiety.  Multiple medical problems.  Past history of suicide attempts.  Currently denying acute suicidal ideation.  Past Medical History:  Past Medical History:  Diagnosis Date  . Acanthosis nigricans   . Anxiety   . CHF (congestive heart failure) (HCC)   . Chronic lower back pain   . Depression   . Dyspepsia   . Obesity   . Ovarian cyst    pt is not aware of this hx (11/24/2017)  . Pre-diabetes   . 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 Premature baby   . Seizures (HCC)   . Type II diabetes mellitus (HCC)    insulin dependant    Past Surgical History:  Procedure Laterality Date  . ABDOMINAL HERNIA REPAIR     "I was a baby"  . HERNIA REPAIR    . TONSILLECTOMY AND ADENOIDECTOMY    . WISDOM TOOTH EXTRACTION  2017   Family History:  Family History  Problem Relation Age of Onset  .  Diabetes Mother   . Hypertension Mother   . Obesity Mother   . Asthma Mother   . Allergic rhinitis Mother   . Eczema Mother   . Diabetes Father   . Hypertension Father   . Obesity Father   . Hyperlipidemia Father   . Hypertension Paternal Aunt   . Hypertension Maternal Grandfather   . Colon cancer Maternal Grandfather   . Diabetes Paternal Grandmother   . Obesity Paternal Grandmother   . Diabetes Paternal Grandfather   . Obesity Paternal Grandfather   . Angioedema Neg Hx   . Immunodeficiency Neg Hx   . Urticaria Neg Hx   . Stomach cancer Neg Hx   . Esophageal cancer Neg Hx    Family Psychiatric  History: See previous mostly past history of medical problems Social History:  Social History   Substance and Sexual Activity  Alcohol Use No  . Alcohol/week: 0.0 standard drinks     Social History   Substance and Sexual Activity  Drug Use No    Social History   Socioeconomic History  . Marital status: Single    Spouse name: Not on file  . Number of children: 0  . Years of education: Not on file  . Highest education level: Not on file  Occupational History  . Occupation: Admission  Social Needs  . Financial resource strain: Not on file  . Food insecurity    Worry: Not on file    Inability: Not on file  . Transportation  needs    Medical: Not on file    Non-medical: Not on file  Tobacco Use  . Smoking status: Never Smoker  . Smokeless tobacco: Never Used  Substance and Sexual Activity  . Alcohol use: No    Alcohol/week: 0.0 standard drinks  . Drug use: No  . Sexual activity: Never  Lifestyle  . Physical activity    Days per week: Not on file    Minutes per session: Not on file  . Stress: Not on file  Relationships  . Social Musicianconnections    Talks on phone: Not on file    Gets together: Not on file    Attends religious service: Not on file    Active member of club or organization: Not on file    Attends meetings of clubs or organizations: Not on file     Relationship status: Not on file  Other Topics Concern  . Not on file  Social History Narrative   Lives with mom and mom's girlfriend. Dad involved. 9th grade [employer].    Hospital Course: Patient admitted to the psychiatric unit.  She was cooperative and calm throughout her hospital stay.  Displayed no dangerous or psychotic or suicidal behavior.  She was cooperative with treatment.  Interacted with peers and staff well.  Displayed no symptoms of psychosis.  We made some decisions about changing medicines.  Increased dose of Prozac to 40 mg and discontinued Seroquel in favor of Abilify 10 mg.  Patient participated in groups and individual assessment.  Patient denied any suicidal ideation.  She had multiple medical problems and medical complaints which seems to be a chronic concern for her.  Her blood sugars however seem to be much better controlled than they were previously.  She has been maintained on her regular standing daily insulin and 3 times a day shots with her meals.  At the time of discharge she is calm and lucid.  Agrees to outpatient treatment.  No complaints about medicine.  She has a safe place to live with her mother.  She denies any current suicidal thoughts and appears to be at her baseline for now.  Not likely to benefit from further hospital stay.  Physical Findings: AIMS:  , ,  ,  ,    CIWA:    COWS:     Musculoskeletal: Strength & Muscle Tone: within normal limits Gait & Station: normal Patient leans: N/A  Psychiatric Specialty Exam: Physical Exam  Nursing note and vitals reviewed. Constitutional: She appears well-developed and well-nourished.  HENT:  Head: Normocephalic and atraumatic.  Eyes: Pupils are equal, round, and reactive to light. Conjunctivae are normal.  Neck: Normal range of motion.  Cardiovascular: Regular rhythm and normal heart sounds.  Respiratory: Effort normal. No respiratory distress.  GI: Soft.  Musculoskeletal: Normal range of  motion.  Neurological: She is alert.  Skin: Skin is warm and dry.  Psychiatric: Her speech is normal and behavior is normal. Judgment normal. Her affect is blunt. Thought content is not paranoid. Cognition and memory are normal. She expresses no homicidal and no suicidal ideation.    Review of Systems  Constitutional: Negative.   HENT: Negative.   Eyes: Negative.   Respiratory: Negative.   Cardiovascular: Negative.   Gastrointestinal: Positive for constipation.  Musculoskeletal: Negative.   Skin: Negative.   Neurological: Negative.   Psychiatric/Behavioral: Positive for depression. Negative for hallucinations, memory loss, substance abuse and suicidal ideas. The patient is nervous/anxious. The patient does not have insomnia.  Blood pressure 128/88, pulse (!) 108, temperature (!) 97.3 F (36.3 C), temperature source Oral, resp. rate 18, height 5\' 3"  (1.6 m), weight 89.4 kg, last menstrual period 08/30/2018, SpO2 100 %.Body mass index is 34.9 kg/m.  General Appearance: Casual  Eye Contact:  Fair  Speech:  Clear and Coherent  Volume:  Normal  Mood:  Euthymic  Affect:  Congruent  Thought Process:  Coherent  Orientation:  Full (Time, Place, and Person)  Thought Content:  Logical  Suicidal Thoughts:  No  Homicidal Thoughts:  No  Memory:  Immediate;   Fair Recent;   Fair Remote;   Fair  Judgement:  Fair  Insight:  Fair  Psychomotor Activity:  Normal  Concentration:  Concentration: Fair  Recall:  [place] of Knowledge:  Fair  Language:  Fair  Akathisia:  No  Handed:  Right  AIMS (if indicated):     Assets:  Desire for Improvement Housing Social Support  ADL's:  Intact  Cognition:  WNL  Sleep:  Number of Hours: 6     Have you used any form of tobacco in the last 30 days? (Cigarettes, Smokeless Tobacco, Cigars, and/or Pipes): No  Has this patient used any form of tobacco in the last 30 days? (Cigarettes, Smokeless Tobacco, Cigars, and/or Pipes) Yes, No  Blood  Alcohol level:  Lab Results  Component Value Date   ETH <10 16/10/9602    Metabolic Disorder Labs:  Lab Results  Component Value Date   HGBA1C 12.9 (A) 07/13/2018   MPG 297.7 01/24/2018   MPG 263.26 12/19/2017   No results found for: PROLACTIN Lab Results  Component Value Date   CHOL 143 09/18/2012   TRIG 86 09/18/2012   HDL 44 09/18/2012   CHOLHDL 3.3 09/18/2012   VLDL 17 09/18/2012   LDLCALC 82 09/18/2012   LDLCALC 72 06/30/2011    See Psychiatric Specialty Exam and Suicide Risk Assessment completed by Attending Physician prior to discharge.  Discharge destination:  Home  Is patient on multiple antipsychotic therapies at discharge:  No   Has Patient had three or more failed trials of antipsychotic monotherapy by history:  No  Recommended Plan for Multiple Antipsychotic Therapies: NA  Discharge Instructions    Diet - low sodium heart healthy   Complete by: As directed    Increase activity slowly   Complete by: As directed      Allergies as of 09/17/2018      Reactions   Ibuprofen Other (See Comments)   GI MD said to not take this anymore      Medication List    STOP taking these medications   acetaminophen 325 MG tablet Commonly known as: TYLENOL   gabapentin 300 MG capsule Commonly known as: NEURONTIN   prochlorperazine 10 MG tablet Commonly known as: COMPAZINE   propranolol 20 MG tablet Commonly known as: INDERAL   QUEtiapine 100 MG tablet Commonly known as: SEROQUEL   senna-docusate 8.6-50 MG tablet Commonly known as: Senokot-S     TAKE these medications     Indication  ARIPiprazole 10 MG tablet Commonly known as: ABILIFY Take 1 tablet (10 mg total) by mouth daily. Start taking on: September 18, 2018  Indication: Major Depressive Disorder   atorvastatin 20 MG tablet Commonly known as: LIPITOR Take 1 tablet (20 mg total) by mouth daily at 6 PM. What changed: when to take this  Indication: High Amount of Fats in the Blood    escitalopram 20 MG tablet Commonly known as:  LEXAPRO Take 1 tablet (20 mg total) by mouth daily.  Indication: Major Depressive Disorder   FLUoxetine 40 MG capsule Commonly known as: PROZAC Take 1 capsule (40 mg total) by mouth at bedtime. What changed:   medication strength  how much to take  additional instructions  Indication: Depression, Major Depressive Disorder   glucagon 1 MG injection Use for Severe Hypoglycemia . Inject 1 mg intramuscularly if unresponsive, unable to swallow, unconscious and/or has seizure What changed:   how much to take  how to take this  when to take this  reasons to take this  additional instructions  Indication: Disorder with Low Blood Sugar   hydrOXYzine 25 MG tablet Commonly known as: ATARAX/VISTARIL Take 1 tablet (25 mg total) by mouth 3 (three) times daily as needed for anxiety.  Indication: Feeling Anxious   insulin aspart 100 UNIT/ML injection Commonly known as: novoLOG Inject 5 Units into the skin 3 (three) times daily with meals. What changed:   how much to take  additional instructions  Indication: Type 2 Diabetes   insulin glargine 100 UNIT/ML injection Commonly known as: LANTUS Inject 0.52 mLs (52 Units total) into the skin daily.  Indication: Insulin-Dependent Diabetes   lamoTRIgine 25 MG tablet Commonly known as: LAMICTAL Take 1 tablet (25 mg total) by mouth 2 (two) times daily.  Indication: Manic-Depression   multivitamin with minerals Tabs tablet Take 1 tablet by mouth daily. Start taking on: September 18, 2018  Indication: Vitamin and/or Mineral Deficiency   polyethylene glycol 17 g packet Commonly known as: MIRALAX / GLYCOLAX Take 17 g by mouth daily.  Indication: Constipation   topiramate 25 MG tablet Commonly known as: TOPAMAX Take 1 tablet (25 mg total) by mouth 2 (two) times daily. What changed: additional instructions  Indication: Partial Onset Seizure   trazodone 300 MG tablet Commonly  known as: DESYREL Take 1 tablet (300 mg total) by mouth at bedtime.  Indication: Trouble Sleeping      Follow-up Information    [facility] Follow up on 10/05/2018.   Why: Please follow up with Dr. Delford FieldWright on Tuesday, September 29 th at 10:00am.  Please take your hospital discharge paperwork with you to your appointment.   Contact information: [address] [place] [id number] [phone]          Follow-up recommendations:  Activity:  Activity as tolerated Diet:  Diabetic diet Other:  Psychoeducation about depression and mood stability.  Continue current medicine follow-up with health and wellness clinic  Comments: Prescriptions given at discharge  Signed: Mordecai RasmussenJohn Arhaan Chesnut, MD 09/17/2018, 12:15 PM

## 2018-09-17 NOTE — Plan of Care (Signed)
  Problem: Education: Goal: Knowledge of [facility] Education information/materials will improve Outcome: Progressing Goal: Emotional status will improve Outcome: Progressing Goal: Mental status will improve Outcome: Progressing Goal: Verbalization of understanding the information provided will improve Outcome: Progressing   Problem: Coping: Goal: Ability to verbalize frustrations and anger appropriately will improve Outcome: Progressing Goal: Ability to demonstrate self-control will improve Outcome: Progressing   Problem: Safety: Goal: Periods of time without injury will increase Outcome: Progressing   Problem: Activity: Goal: Interest or engagement in leisure activities will improve Outcome: Progressing Goal: Imbalance in normal sleep/wake cycle will improve Outcome: Progressing   Problem: Health Behavior/Discharge Planning: Goal: Ability to make decisions will improve Outcome: Progressing   Problem: Coping: Goal: Will verbalize feelings Outcome: Progressing

## 2018-09-17 NOTE — Progress Notes (Signed)
Recreation Therapy Notes  Date: 09/17/2018  Time: 9:30 am  Location: Craft room  Behavioral response: Appropriate   Intervention Topic: Communication   Discussion/Intervention:  Group content today was focused on communication. The group defined communication and ways to communicate with others. Individuals stated reason why communication is important and some reasons to communicate with others. Patients expressed if they thought they were good at communicating with others and ways they could improve their communication skills. The group identified important parts of communication and some experiences they have had in the past with communication. The group participated in the intervention "Words in a Bag", where they had a chance to test out their communication skills and identify ways to improve their communication techniques.  Clinical Observations/Feedback:  Patient came to group late due to unknown reasons. Individual was social with peers and staff while participating in group.  Dazani Norby LRT/CTRS           Siona Coulston 09/17/2018 11:24 AM

## 2018-09-17 NOTE — BHH Suicide Risk Assessment (Signed)
[facility] INPATIENT:  Family/Significant Other Suicide Prevention Education  Suicide Prevention Education:  Patient Refusal for Family/Significant Other Suicide Prevention Education: The patient Nancy [name] has refused to provide written consent for family/significant other to be provided Family/Significant Other Suicide Prevention Education during admission and/or prior to discharge.  Physician notified.  SPE completed with pt, as pt refused to consent to family contact. SPI pamphlet provided to pt and pt was encouraged to share information with support network, ask questions, and talk about any concerns relating to SPE. Pt denies access to guns/firearms and verbalized understanding of information provided. Mobile Crisis information also provided to pt.   Rozann Lesches 09/17/2018, 9:56 AM

## 2018-09-17 NOTE — BHH Suicide Risk Assessment (Signed)
[facility] Discharge Suicide Risk Assessment   Principal Problem: MDD (major depressive disorder), recurrent severe, without psychosis ([place]) Discharge Diagnoses: Principal Problem:   MDD (major depressive disorder), recurrent severe, without psychosis ([place]) Active Problems:   Hypertension   Dyspepsia   Diabetes type 2, uncontrolled ([place])   Generalized abdominal pain   Chronic abdominal pain   Conversion disorder with attacks or seizures, acute episode, with psychological stressor   Total Time spent with patient: 45 minutes  Musculoskeletal: Strength & Muscle Tone: within normal limits Gait & Station: normal Patient leans: N/A  Psychiatric Specialty Exam: Review of Systems  Constitutional: Negative.   HENT: Negative.   Eyes: Negative.   Respiratory: Negative.   Cardiovascular: Negative.   Gastrointestinal: Positive for constipation.  Musculoskeletal: Negative.   Skin: Negative.   Neurological: Negative.   Psychiatric/Behavioral: Negative for depression, hallucinations, memory loss, substance abuse and suicidal ideas. The patient is nervous/anxious. The patient does not have insomnia.     Blood pressure 128/88, pulse (!) 108, temperature (!) 97.3 F (36.3 C), temperature source Oral, resp. rate 18, height 5\' 3"  (1.6 m), weight 89.4 kg, last menstrual period 08/30/2018, SpO2 100 %.Body mass index is 34.9 kg/m.  General Appearance: Casual  Eye Contact::  Good  Speech:  Clear and [id number]  Volume:  Normal  Mood:  Euthymic  Affect:  Congruent  Thought Process:  Goal Directed  Orientation:  Full (Time, Place, and Person)  Thought Content:  Logical  Suicidal Thoughts:  No  Homicidal Thoughts:  No  Memory:  Immediate;   Fair Recent;   Fair Remote;   Fair  Judgement:  Fair  Insight:  Fair  Psychomotor Activity:  Normal  Concentration:  Fair  Recall:  [employer] [place]  Language: Fair  Akathisia:  No  Handed:  Right  AIMS (if indicated):     Assets:  Desire  for Improvement Housing Resilience  Sleep:  Number of Hours: 6  Cognition: WNL  ADL's:  Intact   Mental Status Per Nursing Assessment::   On Admission:  NA  Demographic Factors:  Adolescent or young adult  Loss Factors: Loss of significant relationship  Historical Factors: Impulsivity  Risk Reduction Factors:   Sense of responsibility to family, Positive social support and Positive therapeutic relationship  Continued Clinical Symptoms:  Depression:   Impulsivity  Cognitive Features That Contribute To Risk:  None    Suicide Risk:  Minimal: No identifiable suicidal ideation.  Patients presenting with no risk factors but with morbid ruminations; may be classified as minimal risk based on the severity of the depressive symptoms  Follow-up [facility] Follow up on 10/05/2018.   Why: Please follow up with Dr. Joya Gaskins on Tuesday, September 29 th at 10:00am.  Please take your hospital discharge paperwork with you to your appointment.   Contact information: [place] [phone]          Plan Of Care/Follow-up recommendations:  Activity:  Activity as tolerated Diet:  Diabetic diet Other:  Follow-up with outpatient care as directed above  Alethia Berthold, MD 09/17/2018, 12:06 PM

## 2018-09-17 NOTE — Plan of Care (Signed)
Patient authorized for discharge per attending orders. Care planning complete.    Problem: Education: Goal: Knowledge of [facility] Education information/materials will improve Outcome: Completed/Met Goal: Emotional status will improve Outcome: Completed/Met Goal: Mental status will improve Outcome: Completed/Met Goal: Verbalization of understanding the information provided will improve Outcome: Completed/Met   Problem: Coping: Goal: Ability to verbalize frustrations and anger appropriately will improve Outcome: Completed/Met Goal: Ability to demonstrate self-control will improve Outcome: Completed/Met   Problem: Safety: Goal: Periods of time without injury will increase Outcome: Completed/Met   Problem: Activity: Goal: Interest or engagement in leisure activities will improve Outcome: Completed/Met Goal: Imbalance in normal sleep/wake cycle will improve Outcome: Completed/Met   Problem: Health Behavior/Discharge Planning: Goal: Ability to make decisions will improve Outcome: Completed/Met   Problem: Health Behavior/Discharge Planning: Goal: Ability to make decisions will improve Outcome: Completed/Met   Problem: Coping: Goal: Will verbalize feelings Outcome: Completed/Met

## 2018-09-17 NOTE — Progress Notes (Signed)
Patient is alert and stable and communication somatic complains , she complained of chest pain but Vital signs are stable , she complained abdominal pain , and later came and say legs are hurting me, patient had had plainty medication for different complains and continues to hover around the nurses station loking for something to say.patient is educated and encouraged to go lay down in her bed and get some sleep, patient denies SI/HI [place] no distress 15 minutes safety check is maintained.

## 2018-09-17 NOTE — Progress Notes (Signed)
Patient has been calm and cooperative. Pt. Denies si/hi/avh, able to contract for safety. Pt. Is pleasant and appropriate. Pt. Endorses a normal mood. Pt. Observed eating good. Pt. Eager for discharge.

## 2018-09-17 NOTE — Progress Notes (Signed)
D:Patient denies SI/HI/AVH, able to contract for safety at this time. Pt appears calm and cooperative, and no distress noted.   A: All Personal items in locker returned to pt. Upon discharge.   R:  Pt States she will comply with discharge planning put into place and take MEDS as prescribed. Pt escorted out of the building by this writer/staff.  

## 2018-09-17 NOTE — Progress Notes (Signed)
  [facility] Adult Case Management Discharge Plan :  Will you be returning to the same living situation after discharge:  Yes,  pt reports that she is returning home.  At discharge, do you have transportation home?: Yes,  CSW will support with a cab home. Do you have the ability to pay for your medications: Yes,  [facility]  Release of information consent forms completed and in the chart;  Patient's signature needed at discharge.  Patient to Follow up at: Follow-up [facility] Follow up on 10/05/2018.   Why: Please follow up with Dr. Joya Gaskins on Tuesday, September 29 th at 10:00am.  Please take your hospital discharge paperwork with you to your appointment.   Contact information: [address] [place] [address] [phone]          Next level of care provider has access to [place] and Suicide Prevention discussed: No. Patient declined SPE contact.  Have you used any form of tobacco in the last 30 days? (Cigarettes, Smokeless Tobacco, Cigars, and/or Pipes): No  Has patient been referred to the Quitline?: N/A patient is not a smoker  Patient has been referred for addiction treatment: [place], LCSW 09/17/2018, 9:54 AM

## 2018-09-17 NOTE — BHH Group Notes (Signed)
LCSW Group Therapy Note  09/17/2018 1:47 PM  Type of Therapy and Topic:  Group Therapy:  Feelings around Relapse and Recovery  Participation Level:  Active   Description of Group:    Patients in this group will discuss emotions they experience before and after a relapse. They will process how experiencing these feelings, or avoidance of experiencing them, relates to having a relapse. Facilitator will guide patients to explore emotions they have related to recovery. Patients will be encouraged to process which emotions are more powerful. They will be guided to discuss the emotional reaction significant others in their lives may have to their relapse or recovery. Patients will be assisted in exploring ways to respond to the emotions of others without this contributing to a relapse.  Therapeutic Goals: 1. Patient will identify two or more emotions that lead to a relapse for them 2. Patient will identify two emotions that result when they relapse 3. Patient will identify two emotions related to recovery 4. Patient will demonstrate ability to communicate their needs through discussion and/or role plays   Summary of Patient Progress: Pt was appropriate and respectful in group. Pt was able to identify triggers to her relapsing on her mental health as not taking medication and being around negative people. Pt reported that her sister is a support for her when she feel like she is going to get in a bad space and reported that to cope she likes to listen to music and talk with her friends.    Therapeutic Modalities:   Cognitive Behavioral Therapy Solution-Focused Therapy Assertiveness Training Relapse Prevention Therapy   Evalina Field, MSW, LCSW Clinical Social Work 09/17/2018 1:47 PM

## 2018-09-27 MED FILL — ARIPiprazole 10 MG TABS: 10 | 30 days supply | Qty: 30 | Fill #0

## 2018-09-27 MED FILL — ATORVASTATIN 20 MG TABLET: 20 | 30 days supply | Qty: 30 | Fill #0

## 2018-09-27 MED FILL — FLUoxetine HCL 40 MG CAPS: 40 | 30 days supply | Qty: 30 | Fill #0

## 2018-09-27 MED FILL — hydrOXYzine HCL 25 MG TABS: 25 | 20 days supply | Qty: 60 | Fill #0

## 2018-09-27 MED FILL — traZODone HCL 100 MG TABS: 100 | 30 days supply | Qty: 90 | Fill #0

## 2018-09-27 MED FILL — ESCITALOPRAM 20 MG TABLET: 20 | 30 days supply | Qty: 30 | Fill #0

## 2018-09-29 ENCOUNTER — Ambulatory Visit: Payer: Medicaid Other | Admitting: Nurse Practitioner

## 2018-09-29 ENCOUNTER — Other Ambulatory Visit: Payer: Self-pay

## 2018-09-29 ENCOUNTER — Emergency Department (HOSPITAL_COMMUNITY)
Admission: EM | Admit: 2018-09-29 | Discharge: 2018-09-29 | Disposition: A | Discharge status: Home / Self Care | Payer: Medicaid Other | Attending: Emergency Medicine | Admitting: Emergency Medicine

## 2018-09-29 DIAGNOSIS — E860 Dehydration: Secondary | ICD-10-CM | POA: Insufficient documentation

## 2018-09-29 DIAGNOSIS — I509 Heart failure, unspecified: Secondary | ICD-10-CM | POA: Insufficient documentation

## 2018-09-29 DIAGNOSIS — Z794 Long term (current) use of insulin: Secondary | ICD-10-CM | POA: Insufficient documentation

## 2018-09-29 DIAGNOSIS — Z79899 Other long term (current) drug therapy: Secondary | ICD-10-CM | POA: Diagnosis not present

## 2018-09-29 DIAGNOSIS — E119 Type 2 diabetes mellitus without complications: Secondary | ICD-10-CM | POA: Insufficient documentation

## 2018-09-29 DIAGNOSIS — I110 Hypertensive heart disease with heart failure: Secondary | ICD-10-CM | POA: Diagnosis not present

## 2018-09-29 DIAGNOSIS — R569 Unspecified convulsions: Secondary | ICD-10-CM | POA: Diagnosis present

## 2018-09-29 LAB — BASIC METABOLIC PANEL
Anion gap: 11 (ref 5–15)
BUN: 8 mg/dL (ref 6–20)
CO2: 26 mmol/L (ref 22–32)
Calcium: 8.8 mg/dL — ABNORMAL LOW (ref 8.9–10.3)
Chloride: 101 mmol/L (ref 98–111)
Creatinine, Ser: 1.47 mg/dL — ABNORMAL HIGH (ref 0.44–1.00)
GFR calc Af Amer: 59 mL/min — ABNORMAL LOW (ref >60)
GFR calc non Af Amer: 51 mL/min — ABNORMAL LOW (ref >60)
Glucose, Bld: 308 mg/dL — ABNORMAL HIGH (ref 70–99)
Potassium: 4.1 mmol/L (ref 3.5–5.1)
Sodium: 138 mmol/L (ref 135–145)

## 2018-09-29 LAB — CBC
HCT: 40.7 % (ref 36.0–46.0)
Hemoglobin: 13.3 g/dL (ref 12.0–15.0)
MCH: 28.8 pg (ref 26.0–34.0)
MCHC: 32.7 g/dL (ref 30.0–36.0)
MCV: 88.1 fL (ref 80.0–100.0)
Platelets: 318 10*3/uL (ref 150–400)
RBC: 4.62 MIL/uL (ref 3.87–5.11)
RDW: 13.4 % (ref 11.5–15.5)
WBC: 8.1 10*3/uL (ref 4.0–10.5)
nRBC: 0 % (ref 0.0–0.2)

## 2018-09-29 LAB — CBG MONITORING, ED: Glucose-Capillary: 305 mg/dL — ABNORMAL HIGH (ref 70–99)

## 2018-09-29 MED ORDER — SODIUM CHLORIDE 0.9 % IV BOLUS
1000.0000 mL | Freq: Once | INTRAVENOUS | Status: AC
  Administered 2018-09-29: 17:00:00 1000 mL via INTRAVENOUS

## 2018-09-29 MED ORDER — SODIUM CHLORIDE 0.9 % IV BOLUS
500.0000 mL | Freq: Once | INTRAVENOUS | Status: AC
  Administered 2018-09-29: 500 mL via INTRAVENOUS

## 2018-09-29 MED ORDER — LORAZEPAM 2 MG/ML IJ SOLN
1.0000 mg | Freq: Once | INTRAMUSCULAR | Status: AC
  Administered 2018-09-29: 1 mg via INTRAVENOUS
  Filled 2018-09-29: qty 1

## 2018-09-29 NOTE — ED Notes (Signed)
Pt requested meal, this nurse gave her a [place] sandwich and water

## 2018-09-29 NOTE — ED Triage Notes (Signed)
Pt states history of depression/SI, does not have SI thoughts currently. States she woke up incontinent this morning in addition to fogginess, reports she feels continent currently.

## 2018-09-29 NOTE — Discharge Instructions (Signed)
Please return for any problem.  Follow-up with your regular care provider as instructed. °

## 2018-09-29 NOTE — ED Notes (Signed)
Discharge instructions discussed with Pt. Pt verbalized understanding. Pt stable and ambulatory.    

## 2018-09-29 NOTE — Progress Notes (Signed)
Inpatient Diabetes Program Recommendations  AACE/ADA: New Consensus Statement on Inpatient Glycemic Control   Target Ranges:  Prepandial:   less than 140 mg/dL      Peak postprandial:   less than 180 mg/dL (1-2 hours)      Critically ill patients:  140 - 180 mg/dL   Results for LATRONDA, SPINK (MRN [id number]) as of 09/29/2018 14:43  Ref. Range 09/29/2018 13:43  Glucose-Capillary Latest Ref Range: 70 - 99 mg/dL 305 (H)  Results for TYNIESHA, HOWALD (MRN [id number]) as of 09/29/2018 14:43  Ref. Range 07/13/2018 16:16  Hemoglobin A1C Latest Ref Range: 4.0 - 5.6 % 12.9 (A)   Review of Glycemic Control  Diabetes history: DM2 (managed as DM1) Outpatient Diabetes medications: Lantus 52 units daily, Novolog 5 units TID with meals Current orders for Inpatient glycemic control: None; in ED  Inpatient Diabetes Program Recommendations:   Inpatient DM order recommendations if admitted: If patient is admitted, please consider ordering Lantus 45 units Q24H, CBGs AC&HS, Novolog 0-15 units TID with meals, Novolog 0-5 units QHS, and Novolog 3 units TID with meals for meal coverage if patient eats at least 50% of meals.  NOTE: Noted patient in Emergency Department at this time with reported seizures. Noted patient was just discharged from [facility] on 09/17/18. Patient has had 7 hospital admission since January 06, 2018 and is well known to inpatient diabetes team.  Thanks, Barnie Alderman, RN, MSN, CDE Diabetes Coordinator Inpatient Diabetes Program [phone] (Team Pager from 8am to 5pm)

## 2018-09-29 NOTE — ED Triage Notes (Signed)
Pt comes from home via EMS c/o seizure hx, woke up this morning feeling "foggy & out of it" and reports abdomen/chest pain.

## 2018-09-29 NOTE — ED Provider Notes (Signed)
[facility] EMERGENCY DEPARTMENT Provider Note   CSN: [id number] Arrival date & time: 09/29/18  1333     History   Chief Complaint Chief Complaint  Patient presents with  . Seizures    HPI Nancy [name] is a 18 y.o. female.     HPI  18 year old female history of CHF, insulin-dependent diabetes, seizures reports that she has had up to 15 seizures today.  She states she has a seizure disorder diagnosed approximately 3 years ago.  She states she normally has seizures approximately 3 times a day.  She states that she takes her Keppra and Lamictal as prescribed.  She reports taking the Keppra in the morning and the Lamictal at night.  She states that her mother tells her that she has generalized seizures.  She states that dealing with that she knows she has seizures that she wakes up with urine on her.  She reports this is happened today.  Primary care is at [facility].  She reports seeing [facility] for her seizures She denies any recent febrile illness, cough, dyspnea, abdominal pain, vomiting, or diarrhea.  States that she has had poor appetite and has been seeing a gastroenterologist but this has not changed recently.  She denies any changes in urinary symptoms.  She denies pregnancy.  Past Medical History:  Diagnosis Date  . Acanthosis nigricans   . Anxiety   . CHF (congestive heart failure) (HCC)   . Chronic lower back pain   . Depression   . Dyspepsia   . Obesity   . Ovarian cyst    pt is not aware of this hx (11/24/2017)  . Pre-diabetes   . 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 Premature baby   . Seizures (HCC)   . Type II diabetes mellitus (HCC)    insulin dependant    Patient Active Problem List   Diagnosis Date Noted  . Conversion disorder with attacks or seizures, acute episode, with psychological stressor 09/16/2018  . DKA, type 1 (HCC) 09/13/2018  . Suicidal ideations 09/13/2018  . MDD (major depressive disorder),  recurrent severe, without psychosis (HCC)   . Hypokalemia 08/04/2018  . Hypomagnesemia 08/04/2018  . Acute kidney injury (HCC) 08/04/2018  . Seizure (HCC) 08/03/2018  . Depression with anxiety 07/25/2018  . DKA (diabetic ketoacidoses) (HCC) 07/25/2018  . Uncontrolled seizures (HCC) 06/14/2018  . Syncope 01/30/2018  . Orthostatic hypotension 01/24/2018  . DKA (diabetic ketoacidosis) (HCC) 01/24/2018  . Chronic abdominal pain 12/24/2017  . Sinus tachycardia by electrocardiogram   . Acute lower UTI   . Uncontrolled type 2 diabetes mellitus with hyperglycemia (HCC)   . Chest pain 12/19/2017  . Generalized abdominal pain 08/21/2017  . Non compliance with medical treatment 01/27/2012  . Adjustment disorder 09/16/2011  . Diabetes type 2, uncontrolled (HCC) 09/15/2011  . Dyspepsia   . Acanthosis nigricans   . Goiter   . Obesity 06/14/2010  . Hypertension 06/14/2010    Past Surgical History:  Procedure Laterality Date  . ABDOMINAL HERNIA REPAIR     "I was a baby"  . HERNIA REPAIR    . TONSILLECTOMY AND ADENOIDECTOMY    . WISDOM TOOTH EXTRACTION  2017     OB History   No obstetric history on file.      Home Medications    Prior to Admission medications   Medication Sig Start Date End Date Taking? Authorizing Provider  ARIPiprazole (ABILIFY) 10 MG tablet Take 1 tablet (10 mg total) by  mouth daily. 09/18/18   Clapacs, Madie Reno, MD  atorvastatin (LIPITOR) 20 MG tablet Take 1 tablet (20 mg total) by mouth daily at 6 PM. 09/17/18   Clapacs, Madie Reno, MD  escitalopram (LEXAPRO) 20 MG tablet Take 1 tablet (20 mg total) by mouth daily. 09/17/18   Clapacs, Madie Reno, MD  FLUoxetine (PROZAC) 40 MG capsule Take 1 capsule (40 mg total) by mouth at bedtime. 09/17/18   Clapacs, Madie Reno, MD  glucagon 1 MG injection Use for Severe Hypoglycemia . Inject 1 mg intramuscularly if unresponsive, unable to swallow, unconscious and/or has seizure Patient taking differently: Inject 1 mg into the muscle once as  needed (for severe hypoglycemia- if unresponsive, unable to swallow, unconscious, and/or has had seizure(s)).  07/20/13 12/19/25  Renato Shin, MD  hydrOXYzine (ATARAX/VISTARIL) 25 MG tablet Take 1 tablet (25 mg total) by mouth 3 (three) times daily as needed for anxiety. 09/17/18   Clapacs, Madie Reno, MD  insulin aspart (NOVOLOG) 100 UNIT/ML injection Inject 5 Units into the skin 3 (three) times daily with meals. 09/17/18   Clapacs, Madie Reno, MD  insulin glargine (LANTUS) 100 UNIT/ML injection Inject 0.52 mLs (52 Units total) into the skin daily. 09/17/18   Clapacs, Madie Reno, MD  lamoTRIgine (LAMICTAL) 25 MG tablet Take 1 tablet (25 mg total) by mouth 2 (two) times daily. 09/17/18   Clapacs, Madie Reno, MD  Multiple Vitamin (MULTIVITAMIN WITH MINERALS) TABS tablet Take 1 tablet by mouth daily. 09/18/18   Clapacs, Madie Reno, MD  polyethylene glycol (MIRALAX / GLYCOLAX) 17 g packet Take 17 g by mouth daily. 09/17/18   Clapacs, Madie Reno, MD  topiramate (TOPAMAX) 25 MG tablet Take 1 tablet (25 mg total) by mouth 2 (two) times daily. 09/17/18   Clapacs, Madie Reno, MD  traZODone (DESYREL) 300 MG tablet Take 1 tablet (300 mg total) by mouth at bedtime. 09/17/18   Clapacs, Madie Reno, MD    Family History Family History  Problem Relation Age of Onset  . Diabetes Mother   . Hypertension Mother   . Obesity Mother   . Asthma Mother   . Allergic rhinitis Mother   . Eczema Mother   . Diabetes Father   . Hypertension Father   . Obesity Father   . Hyperlipidemia Father   . Hypertension Paternal Aunt   . Hypertension Maternal Grandfather   . Colon cancer Maternal Grandfather   . Diabetes Paternal Grandmother   . Obesity Paternal Grandmother   . Diabetes Paternal Grandfather   . Obesity Paternal Grandfather   . Angioedema Neg Hx   . Immunodeficiency Neg Hx   . Urticaria Neg Hx   . Stomach cancer Neg Hx   . Esophageal cancer Neg Hx     Social History Social History   Tobacco Use  . Smoking status: Never Smoker  .  Smokeless tobacco: Never Used  Substance Use Topics  . Alcohol use: No    Alcohol/week: 0.0 standard drinks  . Drug use: No     Allergies   Ibuprofen   Review of Systems Review of Systems   Physical Exam Updated Vital Signs BP 118/83 (BP Location: Left Arm)   Pulse (!) 125   Temp 99.1 F (37.3 C)   Resp 15   Ht 1.6 m (5\' 3" )   Wt 89.4 kg   LMP 08/30/2018 Comment: pt shielded  BMI 34.90 kg/m   Physical Exam Vitals signs and nursing note reviewed.  Constitutional:      Appearance:  Normal appearance. She is obese.  HENT:     Head: Normocephalic and atraumatic.     Right Ear: External ear normal.     Left Ear: External ear normal.     Nose: Nose normal.     Mouth/Throat:     Mouth: Mucous membranes are moist.  Eyes:     Extraocular Movements: Extraocular movements intact.     Pupils: Pupils are equal, round, and reactive to light.  Neck:     Musculoskeletal: Normal range of motion.  Cardiovascular:     Rate and Rhythm: Regular rhythm. Tachycardia present.  Pulmonary:     Effort: Pulmonary effort is normal.     Breath sounds: Normal breath sounds.  Abdominal:     General: Abdomen is flat. Bowel sounds are normal.     Palpations: Abdomen is soft.  Musculoskeletal: Normal range of motion.  Skin:    General: Skin is warm.     Capillary Refill: Capillary refill takes less than 2 seconds.  Neurological:     General: No focal deficit present.     Mental Status: She is alert and oriented to person, place, and time. Mental status is at baseline.     Cranial Nerves: No cranial nerve deficit.     Motor: No weakness.     Coordination: Coordination normal.  Psychiatric:        Mood and Affect: Mood normal.        Behavior: Behavior normal.      ED Treatments / Results  Labs (all labs ordered are listed, but only abnormal results are displayed) Labs Reviewed  CBG MONITORING, ED - Abnormal; Notable for the following components:      Result Value    Glucose-Capillary 305 (*)    All other components within normal limits    EKG None  Radiology No results found.  Procedures Procedures (including critical care time)  Medications Ordered in ED Medications - No data to display   Initial Impression / Assessment and Plan / ED Course  I have reviewed the triage vital signs and the nursing notes.  Pertinent labs & imaging results that were available during my care of the patient were reviewed by me and considered in my medical decision making (see chart for details).   Discussed with Dr. Rodena Medin and will see  And follow labs. Likely able to discharge      Final Clinical Impressions(s) / ED Diagnoses   Final diagnoses:  Pseudoseizure    ED Discharge Orders    None       Margarita Grizzle, MD 09/29/18 1501

## 2018-09-29 NOTE — ED Provider Notes (Signed)
   Patient seen after prior ED provider.    She has been observed without evidence of further possible seizure-like activity.  Screening labs did demonstrate mild dehydration.  She feels improved following administration of a small dose of benzodiazepines and IV fluids.  Patient is aware of need for close follow-up.  Strict return precautions given and understood.   Valarie Merino, MD 09/29/18 [phone]

## 2018-10-01 ENCOUNTER — Other Ambulatory Visit: Payer: Self-pay

## 2018-10-01 ENCOUNTER — Emergency Department (HOSPITAL_COMMUNITY)
Admission: EM | Admit: 2018-10-01 | Discharge: 2018-10-02 | Disposition: A | Discharge status: Home / Self Care | Payer: Medicaid Other | Attending: Emergency Medicine | Admitting: Emergency Medicine

## 2018-10-01 DIAGNOSIS — F32A Depression, unspecified: Secondary | ICD-10-CM

## 2018-10-01 DIAGNOSIS — E119 Type 2 diabetes mellitus without complications: Secondary | ICD-10-CM | POA: Insufficient documentation

## 2018-10-01 DIAGNOSIS — F445 Conversion disorder with seizures or convulsions: Secondary | ICD-10-CM | POA: Insufficient documentation

## 2018-10-01 DIAGNOSIS — F332 Major depressive disorder, recurrent severe without psychotic features: Secondary | ICD-10-CM | POA: Diagnosis not present

## 2018-10-01 DIAGNOSIS — Z79899 Other long term (current) drug therapy: Secondary | ICD-10-CM | POA: Diagnosis not present

## 2018-10-01 DIAGNOSIS — Z794 Long term (current) use of insulin: Secondary | ICD-10-CM | POA: Insufficient documentation

## 2018-10-01 DIAGNOSIS — F329 Major depressive disorder, single episode, unspecified: Secondary | ICD-10-CM

## 2018-10-01 LAB — CBC WITH DIFFERENTIAL/PLATELET
Abs Immature Granulocytes: 0.02 10*3/uL (ref 0.00–0.07)
Basophils Absolute: 0 10*3/uL (ref 0.0–0.1)
Basophils Relative: 1 %
Eosinophils Absolute: 0.1 10*3/uL (ref 0.0–0.5)
Eosinophils Relative: 2 %
HCT: 39.2 % (ref 36.0–46.0)
Hemoglobin: 12.7 g/dL (ref 12.0–15.0)
Immature Granulocytes: 0 %
Lymphocytes Relative: 42 %
Lymphs Abs: 2.4 10*3/uL (ref 0.7–4.0)
MCH: 28.3 pg (ref 26.0–34.0)
MCHC: 32.4 g/dL (ref 30.0–36.0)
MCV: 87.5 fL (ref 80.0–100.0)
Monocytes Absolute: 0.6 10*3/uL (ref 0.1–1.0)
Monocytes Relative: 10 %
Neutro Abs: 2.6 10*3/uL (ref 1.7–7.7)
Neutrophils Relative %: 45 %
Platelets: 277 10*3/uL (ref 150–400)
RBC: 4.48 MIL/uL (ref 3.87–5.11)
RDW: 13.6 % (ref 11.5–15.5)
WBC: 5.8 10*3/uL (ref 4.0–10.5)
nRBC: 0 % (ref 0.0–0.2)

## 2018-10-01 LAB — RAPID URINE DRUG SCREEN, HOSP PERFORMED
Amphetamines: NOT DETECTED
Barbiturates: NOT DETECTED
Benzodiazepines: NOT DETECTED
Cocaine: NOT DETECTED
Opiates: NOT DETECTED
Tetrahydrocannabinol: NOT DETECTED

## 2018-10-01 LAB — BASIC METABOLIC PANEL
Anion gap: 9 (ref 5–15)
BUN: 10 mg/dL (ref 6–20)
CO2: 28 mmol/L (ref 22–32)
Calcium: 9.3 mg/dL (ref 8.9–10.3)
Chloride: 102 mmol/L (ref 98–111)
Creatinine, Ser: 0.94 mg/dL (ref 0.44–1.00)
GFR calc Af Amer: >60 mL/min (ref >60)
GFR calc non Af Amer: >60 mL/min (ref >60)
Glucose, Bld: 225 mg/dL — ABNORMAL HIGH (ref 70–99)
Potassium: 3.8 mmol/L (ref 3.5–5.1)
Sodium: 139 mmol/L (ref 135–145)

## 2018-10-01 LAB — I-STAT BETA HCG BLOOD, ED (MC, WL, AP ONLY): I-stat hCG, quantitative: <5 m[IU]/mL (ref <5)

## 2018-10-01 LAB — URINALYSIS, ROUTINE W REFLEX MICROSCOPIC
Bilirubin Urine: NEGATIVE
Glucose, UA: >=500 mg/dL — AB
Ketones, ur: 5 mg/dL — AB
Leukocytes,Ua: NEGATIVE
Nitrite: NEGATIVE
Protein, ur: NEGATIVE mg/dL
Specific Gravity, Urine: 1.018 (ref 1.005–1.030)
pH: 7 (ref 5.0–8.0)

## 2018-10-01 LAB — CBG MONITORING, ED: Glucose-Capillary: 248 mg/dL — ABNORMAL HIGH (ref 70–99)

## 2018-10-01 MED ORDER — ACETAMINOPHEN 325 MG PO TABS
650.0000 mg | ORAL_TABLET | ORAL | Status: DC | PRN
  Administered 2018-10-02 (×2): 650 mg via ORAL
  Filled 2018-10-01 (×2): qty 2

## 2018-10-01 MED ORDER — QUETIAPINE FUMARATE 300 MG PO TABS
600.0000 mg | ORAL_TABLET | Freq: Every day | ORAL | Status: DC
  Administered 2018-10-01: 600 mg via ORAL
  Filled 2018-10-01: qty 2

## 2018-10-01 MED ORDER — KETOROLAC TROMETHAMINE 30 MG/ML IJ SOLN
15.0000 mg | Freq: Once | INTRAMUSCULAR | Status: AC
  Administered 2018-10-01: 15 mg via INTRAVENOUS
  Filled 2018-10-01: qty 1

## 2018-10-01 MED ORDER — ARIPIPRAZOLE 10 MG PO TABS
10.0000 mg | ORAL_TABLET | Freq: Every day | ORAL | Status: DC
  Administered 2018-10-02: 10 mg via ORAL
  Filled 2018-10-01: qty 1

## 2018-10-01 MED ORDER — LAMOTRIGINE 25 MG PO TABS
25.0000 mg | ORAL_TABLET | Freq: Every day | ORAL | Status: DC
  Administered 2018-10-02: 25 mg via ORAL
  Filled 2018-10-01: qty 1

## 2018-10-01 MED ORDER — INSULIN GLARGINE 100 UNIT/ML ~~LOC~~ SOLN
20.0000 [IU] | Freq: Every day | SUBCUTANEOUS | Status: DC
  Administered 2018-10-01: 20 [IU] via SUBCUTANEOUS
  Filled 2018-10-01 (×2): qty 0.2

## 2018-10-01 MED ORDER — ZOLPIDEM TARTRATE 5 MG PO TABS: 5.0000 mg | ORAL_TABLET | Freq: Every evening | ORAL | Status: DC | PRN

## 2018-10-01 MED ORDER — LORAZEPAM 1 MG PO TABS
1.0000 mg | ORAL_TABLET | Freq: Once | ORAL | Status: AC
  Administered 2018-10-01: 1 mg via ORAL
  Filled 2018-10-01: qty 1

## 2018-10-01 MED ORDER — TRAZODONE HCL 100 MG PO TABS
300.0000 mg | ORAL_TABLET | Freq: Every day | ORAL | Status: DC
  Administered 2018-10-01: 300 mg via ORAL
  Filled 2018-10-01: qty 3

## 2018-10-01 MED ORDER — ONDANSETRON HCL 4 MG PO TABS: 4.0000 mg | ORAL_TABLET | Freq: Three times a day (TID) | ORAL | Status: DC | PRN

## 2018-10-01 MED ORDER — SODIUM CHLORIDE 0.9 % IV BOLUS
1000.0000 mL | Freq: Once | INTRAVENOUS | Status: AC
  Administered 2018-10-01: 17:00:00 1000 mL via INTRAVENOUS

## 2018-10-01 MED ORDER — LEVETIRACETAM 500 MG PO TABS
500.0000 mg | ORAL_TABLET | Freq: Two times a day (BID) | ORAL | Status: DC
  Administered 2018-10-01 – 2018-10-02 (×2): 500 mg via ORAL
  Filled 2018-10-01 (×2): qty 1

## 2018-10-01 NOTE — ED Provider Notes (Signed)
[place] DEPT Provider Note   CSN: [id number] Arrival date & time: 10/01/18  1509     History   Chief Complaint Chief Complaint  Patient presents with  . Seizures    HPI Nancy [name] is a 18 y.o. female.  Presents to the ER after reported seizures.  Patient states she had up to 15 seizure episodes throughout the day today, last seizure was around 2:00 this afternoon.  States seizure witnessed by her mother.  Has had many similar seizures to this.  No bladder or bowel incontinence on most of the seizure episodes however she thinks she had one episode of urinary incontinence today.  No associated numbness, weakness, vision changes.  States she felt foggy but currently has no confusion.  Patient states since being discharged from behavioral health she has not had more suicidal ideation but has felt depressed and is interested in talking to someone about her depression.  No self-harm activity.  Completed chart review, recent admission to behavioral health for depression, suicidal ideation.  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     HPI  Past Medical History:  Diagnosis Date  . Acanthosis nigricans   . Anxiety   . CHF (congestive heart failure) ([place])   . Chronic lower back pain   . Depression   . Dyspepsia   . Obesity   . Ovarian cyst    pt is not aware of this hx (11/24/2017)  . Pre-diabetes   . Precocious adrenarche ([place])   . Premature baby   . Seizures ([place])   . Type II diabetes mellitus (HCC)    insulin dependant    Patient Active Problem List   Diagnosis Date Noted  . Conversion disorder with attacks or seizures, acute episode, with psychological stressor 09/16/2018  . DKA, type 1 ([place]) 09/13/2018  . Suicidal ideations 09/13/2018  . MDD (major depressive disorder), recurrent severe,  without psychosis ([place])   . Hypokalemia 08/04/2018  . Hypomagnesemia 08/04/2018  . Acute kidney injury ([place]) 08/04/2018  . Seizure ([place]) 08/03/2018  . Depression with anxiety 07/25/2018  . DKA (diabetic ketoacidoses) ([place]) 07/25/2018  . Uncontrolled seizures ([place]) 06/14/2018  . Syncope 01/30/2018  . Orthostatic hypotension 01/24/2018  . DKA (diabetic ketoacidosis) ([place]) 01/24/2018  . Chronic abdominal pain 12/24/2017  . Sinus tachycardia by electrocardiogram   . Acute lower UTI   . Uncontrolled type 2 diabetes mellitus with hyperglycemia ([place])   . Chest pain 12/19/2017  . Generalized abdominal pain 08/21/2017  . Non compliance with medical treatment 01/27/2012  . Adjustment disorder 09/16/2011  . Diabetes type 2, uncontrolled ([place]) 09/15/2011  . Dyspepsia   . Acanthosis nigricans   . Goiter   . Obesity 06/14/2010  . Hypertension 06/14/2010    Past Surgical History:  Procedure Laterality Date  . ABDOMINAL HERNIA REPAIR     "I was a baby"  . HERNIA REPAIR    . TONSILLECTOMY AND ADENOIDECTOMY    . WISDOM TOOTH EXTRACTION  2017     OB History   No obstetric history on file.      Home Medications    Prior to Admission medications   Medication Sig Start Date End Date Taking? Authorizing Provider  ARIPiprazole (ABILIFY) 10 MG tablet Take 1 tablet (10 mg total) by mouth daily. 09/18/18  Yes Clapacs, Madie Reno, MD  Divalproex Sodium (DEPAKOTE ER PO) Take by mouth.  Yes [provider]  escitalopram (LEXAPRO) 20 MG tablet Take 1 tablet (20 mg total) by mouth daily. 09/17/18  Yes Clapacs, Jackquline Denmark, MD  FLUoxetine (PROZAC) 40 MG capsule Take 1 capsule (40 mg total) by mouth at bedtime. 09/17/18  Yes Clapacs, Jackquline Denmark, MD  hydrOXYzine (ATARAX/VISTARIL) 25 MG tablet Take 1 tablet (25 mg total) by mouth 3 (three) times daily as needed for anxiety. 09/17/18  Yes Clapacs, Jackquline Denmark, MD  insulin glargine (LANTUS) 100 UNIT/ML injection Inject 0.52 mLs (52 Units total) into the skin daily.  Patient taking differently: Inject 20 Units into the skin at bedtime.  09/17/18  Yes Clapacs, Jackquline Denmark, MD  lamoTRIgine (LAMICTAL) 25 MG tablet Take 1 tablet (25 mg total) by mouth 2 (two) times daily. Patient taking differently: Take 25 mg by mouth daily.  09/17/18  Yes Clapacs, Jackquline Denmark, MD  levETIRAcetam (KEPPRA) 500 MG tablet Take 500 mg by mouth 2 (two) times daily.   Yes [provider]  Multiple Vitamin (MULTIVITAMIN WITH MINERALS) TABS tablet Take 1 tablet by mouth daily. 09/18/18  Yes Clapacs, Jackquline Denmark, MD  QUEtiapine (SEROQUEL) 300 MG tablet Take 600 mg by mouth at bedtime.   Yes [provider]  topiramate (TOPAMAX) 25 MG tablet Take 1 tablet (25 mg total) by mouth 2 (two) times daily. 09/17/18  Yes Clapacs, Jackquline Denmark, MD  traZODone (DESYREL) 300 MG tablet Take 1 tablet (300 mg total) by mouth at bedtime. 09/17/18  Yes Clapacs, Jackquline Denmark, MD  atorvastatin (LIPITOR) 20 MG tablet Take 1 tablet (20 mg total) by mouth daily at 6 PM. Patient not taking: Reported on 10/01/2018 09/17/18   Clapacs, Jackquline Denmark, MD  glucagon 1 MG injection Use for Severe Hypoglycemia . Inject 1 mg intramuscularly if unresponsive, unable to swallow, unconscious and/or has seizure Patient taking differently: Inject 1 mg into the muscle once as needed (for severe hypoglycemia- if unresponsive, unable to swallow, unconscious, and/or has had seizure(s)).  07/20/13 12/19/25  Romero Belling, MD  insulin aspart (NOVOLOG) 100 UNIT/ML injection Inject 5 Units into the skin 3 (three) times daily with meals. Patient not taking: Reported on 10/01/2018 09/17/18   Clapacs, Jackquline Denmark, MD  polyethylene glycol (MIRALAX / GLYCOLAX) 17 g packet Take 17 g by mouth daily. Patient not taking: Reported on 09/29/2018 09/17/18   Clapacs, Jackquline Denmark, MD    Family History Family History  Problem Relation Age of Onset  . Diabetes Mother   . Hypertension Mother   . Obesity Mother   . Asthma Mother   . Allergic rhinitis Mother   . Eczema Mother   .  Diabetes Father   . Hypertension Father   . Obesity Father   . Hyperlipidemia Father   . Hypertension Paternal Aunt   . Hypertension Maternal Grandfather   . Colon cancer Maternal Grandfather   . Diabetes Paternal Grandmother   . Obesity Paternal Grandmother   . Diabetes Paternal Grandfather   . Obesity Paternal Grandfather   . Angioedema Neg Hx   . Immunodeficiency Neg Hx   . Urticaria Neg Hx   . Stomach cancer Neg Hx   . Esophageal cancer Neg Hx     Social History Social History   Tobacco Use  . Smoking status: Never Smoker  . Smokeless tobacco: Never Used  Substance Use Topics  . Alcohol use: No    Alcohol/week: 0.0 standard drinks  . Drug use: No     Allergies   Ibuprofen   Review  of Systems Review of Systems  Constitutional: Negative for chills and fever.  HENT: Negative for ear pain and sore throat.   Eyes: Negative for pain and visual disturbance.  Respiratory: Negative for cough and shortness of breath.   Cardiovascular: Negative for chest pain and palpitations.  Gastrointestinal: Negative for abdominal pain and vomiting.  Genitourinary: Negative for dysuria and hematuria.  Musculoskeletal: Negative for arthralgias and back pain.  Skin: Negative for color change and rash.  Neurological: Positive for seizures. Negative for syncope.  All other systems reviewed and are negative.    Physical Exam Updated Vital Signs BP (!) 131/93 (BP Location: Right Arm)   Pulse (!) 124   Temp 98.6 F (37 C) (Oral)   Resp 18   Ht 5\' 3"  (1.6 m)   Wt 89.4 kg   SpO2 100%   BMI 34.90 kg/m   Physical Exam Vitals signs and nursing note reviewed.  Constitutional:      General: She is not in acute distress.    Appearance: She is well-developed.  HENT:     Head: Normocephalic and atraumatic.  Eyes:     Conjunctiva/sclera: Conjunctivae normal.  Neck:     Musculoskeletal: Neck supple.  Cardiovascular:     Rate and Rhythm: Regular rhythm. Tachycardia present.      Heart sounds: No murmur.  Pulmonary:     Effort: Pulmonary effort is normal. No respiratory distress.     Breath sounds: Normal breath sounds.  Abdominal:     Palpations: Abdomen is soft.     Tenderness: There is no abdominal tenderness.  Skin:    General: Skin is warm and dry.  Neurological:     General: No focal deficit present.     Mental Status: She is alert and oriented to person, place, and time. Mental status is at baseline.     Cranial Nerves: No cranial nerve deficit.     Sensory: No sensory deficit.     Motor: No weakness.     Coordination: Coordination normal.  Psychiatric:     Comments: Somewhat flattened affect but no SI or HI or AVH      ED Treatments / Results  Labs (all labs ordered are listed, but only abnormal results are displayed) Labs Reviewed  CBG MONITORING, ED - Abnormal; Notable for the following components:      Result Value   Glucose-Capillary 248 (*)    All other components within normal limits  CBC WITH DIFFERENTIAL/PLATELET  URINALYSIS, ROUTINE W REFLEX MICROSCOPIC  RAPID URINE DRUG SCREEN, HOSP PERFORMED  BASIC METABOLIC PANEL  I-STAT BETA HCG BLOOD, ED ([facility], [facility], [facility] ONLY)    EKG None  Radiology No results found.  Procedures Procedures (including critical care time)  Medications Ordered in ED Medications  sodium chloride 0.9 % bolus 1,000 mL (has no administration in time range)     Initial Impression / Assessment and Plan / ED Course  I have reviewed the triage vital signs and the nursing notes.  Pertinent labs & imaging results that were available during my care of the patient were reviewed by me and considered in my medical decision making (see chart for details).  Clinical Course as of Oct 01 1647  Fri Oct 01, 2018  1517 Review chart, triage notes, will go assess patient   [RD]    Clinical Course User Index [RD] Milagros Lollykstra, Dasia Guerrier S, MD      18 year old presents with seizures.  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  Here patient was well-appearing no ongoing seizure episodes.  Noted tachycardia, but on chart review this seems to be not new.  Patient recently discharged from behavioral health, discussed this with patient she denied any SI, HI or self-harm behavior to me but was interested in talking to TTS given her ongoing depression.  TTS evaluated patient and recommended observation overnight and reassessment by psychiatry in the morning.  Patient was placed in psych hold, ordered home medications, voluntary at this time.   Final Clinical Impressions(s) / ED Diagnoses   Final diagnoses:  Pseudoseizure  Depression, unspecified depression type    ED Discharge Orders    None       Milagros Loll, MD 10/02/18 0002

## 2018-10-01 NOTE — ED Triage Notes (Signed)
Per EMS, patient from home, family reports two witnessed seizures x1 hour ago with post ictal state. A&Ox4 and ambulatory with EMS. Hx of same. Family reports daily seizures despite being compliant with keppra, Lamictal, and depakote. C/o nausea and headache at this time.  22g Right hand
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
## 2018-10-01 NOTE — ED Notes (Signed)
IV in right hand placed by EMS no longer patent. Patient voiced pain when flushing. Removed IV in right hand. Inserted 22G in left [facility]

## 2018-10-01 NOTE — BH Assessment (Signed)
[facility] Assessment Progress Note  Case was staffed with Tate NP who recommended patient be observed and monitored.     

## 2018-10-02 LAB — CBG MONITORING, ED: Glucose-Capillary: 268 mg/dL — ABNORMAL HIGH (ref 70–99)

## 2018-10-02 NOTE — ED Notes (Signed)
Continues to complain of severe headache not relieved by Torodol and Ativan. HS meds given plus her Lantus. CBG checked and it was 268. Put her in a recliner trying to make her more comfortable and help relieve her headache. SHe has been sitting on edge of her bed with her head in her arms on the side table. She has remained pleasant and cooperative. Gave her some peanut butter and water.

## 2018-10-02 NOTE — ED Notes (Signed)
Pt DCd off unit to home per MD. Pt alert, calm, cooperative, no s/s of distress. DC information given to pt. Belongings given to pt. Pt ambulatory off the unit, escorted by MHT.  Pt transported by family.

## 2018-10-02 NOTE — ED Notes (Signed)
Denies being suicidal at this time. She acknowledges she answered yes to being SI earlier tonight when asked but said that was said in frustration. Her chief focus now is her headache. Talked about her fiance she has known for 10 years who is in the service and she states she will be graduating from nursing school in Jan. She lives with her mom and states she was brought in tonight by EMS because she was having seizures at home. 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

## 2018-10-02 NOTE — ED Notes (Signed)
Pt walking around unit, looking in nurses station.

## 2018-10-02 NOTE — BH Assessment (Signed)
[place] Assessment Progress Note This Probation officer spoke with patient's mother Nancy [name] [phone] to gather collateral information in reference to patient possibly being discharged this date. Patient's mother reports patient is currently residing with her and feels she is safe to be discharged. Patient's mother voiced no concerns and patient was able to contact for safety. This Probation officer spoke with patient and discussed a safety plan to include patient contacting emergency services if she had thoughts of self harm in the future and present back to the emergency room. Patient agreed to above and states she will follow up with counseling and medication management at her current OP provider.

## 2018-10-02 NOTE — Discharge Summary (Signed)
Physician Discharge Summary Note  Patient:  Nancy [name] is an 18 y.o., female MRN:  [id number] DOB:  [birth year] Patient phone:  [phone] (home)  Patient address:   [address] Nancy [name] [address],  Total Time spent with patient: 30 minutes  Nancy [name] is an 18 y.o. female that was seen at [facility] via Telepsych by this provider 10/02/2018. On the patient initial presentation she presented with S/I. The Patient voice a plan to overdose on Ibuprofen. Patient states she is allegeric to that medication and "it would not take that much." Patient reports one prior attempt at self harm. On evaluation the patient is alert and oriented x4, calm with depressed mood but cooperative, and mood-congruent with affect. The patient does not appear to be responding to internal or external stimuli. Neither is the patient presenting with any delusional thinking. The patient denies auditory or visual hallucinations. The patient denies suicidal, homicidal, or self-harm ideations. The patient is not presenting with any psychotic or paranoid behaviors. During an encounter with the patient, she was able to answer questions appropriately. Collateral was obtained by TTS counselor Mr. Umberger, who spoke with patient's mother Shron Bode [phone] to gather collateral information in reference to patient possibly being discharged this date. Patient's mother reports patient is currently residing with her and feels she is safe to be discharged. Patient's mother voiced no concerns and patient was able to contact for safety. This Clinical research associate spoke with patient and discussed a safety plan to include patient contacting emergency services if she had thoughts of self harm in the future and present back to the emergency room. Patient agreed to above and states she will follow up with counseling and medication management at her current OP provider.      Date of Admission:  10/01/2018 Date of Discharge:  10/02/2018  Reason for Admission:  S/I  Principal Problem: Sezures Discharge Diagnoses: MDD  Past Psychiatric History:  Anxiety Depression Sezures  Past Medical History:  Past Medical History:  Diagnosis Date  . Acanthosis nigricans   . Anxiety   . CHF (congestive heart failure) (HCC)   . Chronic lower back pain   . Depression   . Dyspepsia   . Obesity   . Ovarian cyst    pt is not aware of this hx (11/24/2017)  . Pre-diabetes   . 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 Premature baby   . Seizures (HCC)   . Type II diabetes mellitus (HCC)    insulin dependant    Past Surgical History:  Procedure Laterality Date  . ABDOMINAL HERNIA REPAIR     "I was a baby"  . HERNIA REPAIR    . TONSILLECTOMY AND ADENOIDECTOMY    . WISDOM TOOTH EXTRACTION  2017   Family History:  Family History  Problem Relation Age of Onset  . Diabetes Mother   . Hypertension Mother   . Obesity Mother   . Asthma Mother   . Allergic rhinitis Mother   . Eczema Mother   . Diabetes Father   . Hypertension Father   . Obesity Father   . Hyperlipidemia Father   . Hypertension Paternal Aunt   . Hypertension Maternal Grandfather   . Colon cancer Maternal Grandfather   . Diabetes Paternal Grandmother   . Obesity Paternal Grandmother   . Diabetes Paternal Grandfather   . Obesity Paternal Grandfather   . Angioedema Neg Hx   . Immunodeficiency Neg Hx   . Urticaria Neg Hx   .  Stomach cancer Neg Hx   . Esophageal cancer Neg Hx    Family Psychiatric  History:  Social History:  Social History   Substance and Sexual Activity  Alcohol Use No  . Alcohol/week: 0.0 standard drinks     Social History   Substance and Sexual Activity  Drug Use No    Social History   Socioeconomic History  . Marital status: Single    Spouse name: Not on file  . Number of children: 0  . Years of education: Not on file  . Highest education level: Not on file  Occupational History  . Occupation: Admission  Social  Needs  . Financial resource strain: Not on file  . Food insecurity    Worry: Not on file    Inability: Not on file  . Transportation needs    Medical: Not on file    Non-medical: Not on file  Tobacco Use  . Smoking status: Never Smoker  . Smokeless tobacco: Never Used  Substance and Sexual Activity  . Alcohol use: No    Alcohol/week: 0.0 standard drinks  . Drug use: No  . Sexual activity: Never  Lifestyle  . Physical activity    Days per week: Not on file    Minutes per session: Not on file  . Stress: Not on file  Relationships  . Social Herbalist on phone: Not on file    Gets together: Not on file    Attends religious service: Not on file    Active member of club or organization: Not on file    Attends meetings of clubs or organizations: Not on file    Relationship status: Not on file  Other Topics Concern  . Not on file  Social History Narrative   Lives with mom and mom's girlfriend. Dad involved. 9th grade [employer].    Hospital Course:  24 hours stay  Physical Findings: AIMS:  , ,  ,  ,    CIWA:    COWS:     Musculoskeletal: Strength & Muscle Tone: within normal limits Gait & Station: normal Patient leans: N/A  Psychiatric Specialty Exam: Physical Exam  Nursing note and vitals reviewed. Constitutional: She is oriented to person, place, and time. She appears well-developed and well-nourished.  Neck: Normal range of motion. Neck supple.  Cardiovascular: Normal rate.  Respiratory: Effort normal.  Neurological: She is alert and oriented to person, place, and time.    Review of Systems  Psychiatric/Behavioral: Positive for depression. The patient is nervous/anxious.   All other systems reviewed and are negative.   Blood pressure 111/69, pulse 90, temperature 98.7 F (37.1 C), temperature source Oral, resp. rate 17, height 5\' 3"  (1.6 m), weight 89.4 kg, SpO2 100 %.Body mass index is 34.9 kg/m.  General Appearance: Casual  Eye Contact:   Good  Speech:  Clear and Coherent  Volume:  Decreased  Mood:  Anxious and Depressed  Affect:  Congruent  Thought Process:  Coherent  Orientation:  Full (Time, Place, and Person)  Thought Content:  WDL and Logical  Suicidal Thoughts:  No  Homicidal Thoughts:  No  Memory:  Immediate;   Good Recent;   Good Remote;   Good  Judgement:  Fair  Insight:  Fair  Psychomotor Activity:  Normal  Concentration:  Concentration: Good and Attention Span: Good  Recall:  Good  Fund of Knowledge:  Good  Language:  Good  Akathisia:  Negative  Handed:  Right  AIMS (if  indicated):     Assets:  Desire for Improvement Physical Health Social Support  ADL's:  Intact  Cognition:  WNL  Sleep:   Good        Has this patient used any form of tobacco in the last 30 days? (Cigarettes, Smokeless Tobacco, Cigars, and/or Pipes) Yes, No  Blood Alcohol level:  Lab Results  Component Value Date   ETH <10 09/12/2018    Metabolic Disorder Labs:  Lab Results  Component Value Date   HGBA1C 12.9 (A) 07/13/2018   MPG 297.7 01/24/2018   MPG 263.26 12/19/2017   No results found for: PROLACTIN Lab Results  Component Value Date   CHOL 143 09/18/2012   TRIG 86 09/18/2012   HDL 44 09/18/2012   CHOLHDL 3.3 09/18/2012   VLDL 17 09/18/2012   LDLCALC 82 09/18/2012   LDLCALC 72 06/30/2011    See Psychiatric Specialty Exam and Suicide Risk Assessment completed by Attending Physician prior to discharge.  Discharge destination:  Home  Is patient on multiple antipsychotic therapies at discharge:  No   Has Patient had three or more failed trials of antipsychotic monotherapy by history:  No  Recommended Plan for Multiple Antipsychotic Therapies: NA     Follow-up recommendations:  Other:  See Social Work notes  Comments:  Discharge home. The patient appears reasonably screened and/or stabilized for discharge and does not appear to have emergency medical/psychiatric concerns/conditions requiring further  screening, evaluation, or treatment at this time prior to discharge. Take all of you medications as prescribed by your mental healthcare provider. Report any adverse effects and reactions from your medications to your outpatient provider promptly. Do not engage in alcohol and or illegal drug use while on prescription medicines. Keep all scheduled appointments. This is to ensure that you are getting refills on time and to avoid any interruption in your medication. If you are unable to keep an appointment call to reschedule. Be sure to follow up with resources and follow ups given. In the event of worsening symptoms call the crisis hotline, 911, and or go to the nearest emergency department for appropriate evaluation and treatment of symptoms. Follow-up with your primary care provider for your medical issues, concerns and or health care needs.  Signed: Gillermo Murdoch, NP 10/02/2018, 4:41 PM

## 2018-10-05 ENCOUNTER — Encounter: Payer: Self-pay | Admitting: Critical Care Medicine

## 2018-10-05 ENCOUNTER — Other Ambulatory Visit: Payer: Self-pay

## 2018-10-05 ENCOUNTER — Ambulatory Visit: Payer: Medicaid Other | Attending: Critical Care Medicine | Admitting: Critical Care Medicine

## 2018-10-05 ENCOUNTER — Other Ambulatory Visit: Payer: Self-pay | Admitting: Pharmacist

## 2018-10-05 VITALS — BP 136/93 | HR 101 | Temp 99.0°F | Ht 63.0 in | Wt 197.0 lb

## 2018-10-05 DIAGNOSIS — Z794 Long term (current) use of insulin: Secondary | ICD-10-CM | POA: Diagnosis not present

## 2018-10-05 DIAGNOSIS — R45851 Suicidal ideations: Secondary | ICD-10-CM

## 2018-10-05 DIAGNOSIS — F445 Conversion disorder with seizures or convulsions: Secondary | ICD-10-CM

## 2018-10-05 DIAGNOSIS — I509 Heart failure, unspecified: Secondary | ICD-10-CM | POA: Diagnosis not present

## 2018-10-05 DIAGNOSIS — Z9114 Patient's other noncompliance with medication regimen: Secondary | ICD-10-CM | POA: Diagnosis not present

## 2018-10-05 DIAGNOSIS — F332 Major depressive disorder, recurrent severe without psychotic features: Secondary | ICD-10-CM

## 2018-10-05 DIAGNOSIS — Z91199 Patient's noncompliance with other medical treatment and regimen due to unspecified reason: Secondary | ICD-10-CM

## 2018-10-05 DIAGNOSIS — R000 Tachycardia, unspecified: Secondary | ICD-10-CM | POA: Diagnosis not present

## 2018-10-05 DIAGNOSIS — E10649 Type 1 diabetes mellitus with hypoglycemia without coma: Secondary | ICD-10-CM | POA: Insufficient documentation

## 2018-10-05 DIAGNOSIS — R569 Unspecified convulsions: Secondary | ICD-10-CM | POA: Diagnosis not present

## 2018-10-05 DIAGNOSIS — F319 Bipolar disorder, unspecified: Secondary | ICD-10-CM | POA: Insufficient documentation

## 2018-10-05 DIAGNOSIS — G8929 Other chronic pain: Secondary | ICD-10-CM

## 2018-10-05 DIAGNOSIS — F418 Other specified anxiety disorders: Secondary | ICD-10-CM | POA: Diagnosis not present

## 2018-10-05 DIAGNOSIS — E1065 Type 1 diabetes mellitus with hyperglycemia: Secondary | ICD-10-CM

## 2018-10-05 DIAGNOSIS — R1013 Epigastric pain: Secondary | ICD-10-CM | POA: Insufficient documentation

## 2018-10-05 DIAGNOSIS — Z79899 Other long term (current) drug therapy: Secondary | ICD-10-CM | POA: Insufficient documentation

## 2018-10-05 DIAGNOSIS — N179 Acute kidney failure, unspecified: Secondary | ICD-10-CM | POA: Diagnosis not present

## 2018-10-05 DIAGNOSIS — F419 Anxiety disorder, unspecified: Secondary | ICD-10-CM | POA: Insufficient documentation

## 2018-10-05 DIAGNOSIS — K5900 Constipation, unspecified: Secondary | ICD-10-CM | POA: Diagnosis not present

## 2018-10-05 DIAGNOSIS — E1010 Type 1 diabetes mellitus with ketoacidosis without coma: Secondary | ICD-10-CM | POA: Diagnosis not present

## 2018-10-05 DIAGNOSIS — F4320 Adjustment disorder, unspecified: Secondary | ICD-10-CM | POA: Diagnosis not present

## 2018-10-05 DIAGNOSIS — R1084 Generalized abdominal pain: Secondary | ICD-10-CM | POA: Diagnosis not present

## 2018-10-05 DIAGNOSIS — Z9119 Patient's noncompliance with other medical treatment and regimen: Secondary | ICD-10-CM | POA: Diagnosis not present

## 2018-10-05 DIAGNOSIS — I10 Essential (primary) hypertension: Secondary | ICD-10-CM

## 2018-10-05 DIAGNOSIS — R109 Unspecified abdominal pain: Secondary | ICD-10-CM

## 2018-10-05 DIAGNOSIS — I110 Hypertensive heart disease with heart failure: Secondary | ICD-10-CM | POA: Diagnosis not present

## 2018-10-05 DIAGNOSIS — F4329 Adjustment disorder with other symptoms: Secondary | ICD-10-CM

## 2018-10-05 MED ORDER — ESCITALOPRAM OXALATE 20 MG PO TABS: 20.0000 mg | ORAL_TABLET | Freq: Every day | ORAL | 1 refills | Status: DC

## 2018-10-05 MED ORDER — LAMOTRIGINE 25 MG PO TABS: 25.0000 mg | ORAL_TABLET | Freq: Two times a day (BID) | ORAL | 1 refills | Status: DC

## 2018-10-05 MED ORDER — INSULIN GLARGINE 100 UNIT/ML ~~LOC~~ SOLN: 52.0000 [IU] | Freq: Every day | SUBCUTANEOUS | 3 refills | Status: DC

## 2018-10-05 MED ORDER — ATORVASTATIN CALCIUM 20 MG PO TABS: 20.0000 mg | ORAL_TABLET | Freq: Every day | ORAL | 1 refills | Status: DC

## 2018-10-05 MED ORDER — TRAZODONE HCL 300 MG PO TABS: 300.0000 mg | ORAL_TABLET | Freq: Every day | ORAL | 1 refills | Status: DC

## 2018-10-05 MED ORDER — FLUOXETINE HCL 40 MG PO CAPS: 40.0000 mg | ORAL_CAPSULE | Freq: Every day | ORAL | 1 refills | Status: DC

## 2018-10-05 MED ORDER — INSULIN LISPRO 100 UNIT/ML ~~LOC~~ SOLN: 15.0000 [IU] | Freq: Two times a day (BID) | SUBCUTANEOUS | 6 refills | Status: DC

## 2018-10-05 MED ORDER — HYDROXYZINE HCL 25 MG PO TABS: 25.0000 mg | ORAL_TABLET | Freq: Three times a day (TID) | ORAL | 1 refills | Status: DC | PRN

## 2018-10-05 MED ORDER — TRAZODONE HCL 100 MG PO TABS: 300.0000 mg | ORAL_TABLET | Freq: Every day | ORAL | 2 refills | Status: DC

## 2018-10-05 MED ORDER — TRESIBA 100 UNIT/ML ~~LOC~~ SOLN: 52.0000 [IU] | Freq: Every day | SUBCUTANEOUS | 3 refills | Status: DC

## 2018-10-05 MED ORDER — TOPIRAMATE 25 MG PO TABS: 25.0000 mg | ORAL_TABLET | Freq: Two times a day (BID) | ORAL | 1 refills | Status: DC

## 2018-10-05 MED ORDER — ARIPIPRAZOLE 10 MG PO TABS: 10.0000 mg | ORAL_TABLET | Freq: Every day | ORAL | 1 refills | Status: DC

## 2018-10-05 MED ORDER — PROMETHAZINE HCL 25 MG PO TABS: 25.0000 mg | ORAL_TABLET | Freq: Three times a day (TID) | ORAL | 0 refills | Status: DC | PRN

## 2018-10-05 NOTE — Progress Notes (Signed)
Subjective:    Patient ID: Nancy [name], female    DOB: Jun 04, 2000, 18 y.o.   MRN: [id number]  This is a 18 year old female who has had multiple emergency room and hospital visits for diabetic ketoacidosis and hyperglycemia  The patient was last seen by myself in April of this year for chest pain and had a negative cardiac CT scan  More recently the patient came to the emergency room on 25 September with significant hyperglycemia and complaints of a seizure.  Apparently she had 15 seizures during the day witnessed by the mother.  However neurologically there did not appear to be an issue with active seizures.  She been seen by neurology previously and had EEGs performed and did not show evidence of seizure activity.  The patient reports she is been given Keppra however no evidence for need for antiepileptics were given.  Upon extensive chart review it is apparent the patient's had multiple visits with multiple providers indicating certain conditions are present and they are not.  She does have type 2 diabetes and has been nonadherent to her insulin program in the past.  She was followed previously by pediatric endocrine and then transferred over to [facility] endocrinology and follow-up with Dr. Loanne Drilling for some period of time but in 2017 was discharged from old our endocrine practice owing to lack of adherence to her insulin program.  She was give her Lantus on multiple occasions.  The patient states that she is now in a nursing program at [facility].  She states however due to her schedule she only eats twice a day.  After her last admission for DKA she was discharged on 52 units of Lantus daily and Humalog twice daily at 15 units.  The patient brings with her a very long list of medications.  Note she also has been in behavioral health hospital after emergency room visit recently in September for suicidal ideation.  The discharge summary from that visit is below.  I spent considerable period time  reviewing the chart review section and it is evident the patient has had multiple ED visits and documentation by endocrinology of medication nonadherence to her diabetes medications.  Also the patient continues to report diagnoses that have been disputed by other providers.  She states she has a seizure disorder but neurology has fully worked her up and EEG was normal and recommendations for seizure medicines were not made and yet the patient continues to have prescriptions and reports being on Keppra and other antiepileptics.  The patient clearly has an underlying mental health condition with major depression and anxiety and also some type of conversion reaction.  Below are the discharge summary from recent behavioral health admission where her diagnoses were clarified and she was placed on Abilify and continued on Prozac and was stated in the chart to stay on Lexapro as well which is confusing for me.  The patient has an appointment at [facility] this week and I have encouraged the patient to please keep that appointment so her diagnosis can be further clarified  [place] admit Date of Admission:  09/15/2018 Date of Discharge: September 17, 2018  Reason for Admission: Transferred from [place] where she had been stabilized for an episode of diabetic ketoacidosis before sent here for psychiatric admission because of some suicidal ideation.  Principal Problem: MDD (major depressive disorder), recurrent severe, without psychosis ([place]) Discharge Diagnoses: Principal Problem:   MDD (major depressive disorder), recurrent severe, without psychosis ([place]) Active Problems:   Hypertension  Dyspepsia   Diabetes type 2, uncontrolled (HCC)   Generalized abdominal pain   Chronic abdominal pain   Conversion disorder with attacks or seizures, acute episode, with psychological stressor   Past Psychiatric History: History of recurrent episodes of depression possible atypical bipolar disorder possible PTSD  chronic anxiety.  Multiple medical problems.  Past history of suicide attempts.  Currently denying acute suicidal ideation.  Hospital Course: Patient admitted to the psychiatric unit.  She was cooperative and calm throughout her hospital stay.  Displayed no dangerous or psychotic or suicidal behavior.  She was cooperative with treatment.  Interacted with peers and staff well.  Displayed no symptoms of psychosis.  We made some decisions about changing medicines.  Increased dose of Prozac to 40 mg and discontinued Seroquel in favor of Abilify 10 mg.  Patient participated in groups and individual assessment.  Patient denied any suicidal ideation.  She had multiple medical problems and medical complaints which seems to be a chronic concern for her.  Her blood sugars however seem to be much better controlled than they were previously.  She has been maintained on her regular standing daily insulin and 3 times a day shots with her meals.  At the time of discharge she is calm and lucid.  Agrees to outpatient treatment.  No complaints about medicine.  She has a safe place to live with her mother.  She denies any current suicidal thoughts and appears to be at her baseline for now.  Not likely to benefit from further hospital stay.  Note at discharge she was placed on Abilify and was told to stop her Seroquel and Keppra she was to stay on Tegretol and was to stop Lexapro but continue Prozac at a higher dose  Below is the last admission for DKA and after that admission was transferred to psychiatry because of suicidal ideation Adm DKA 9/6 Admission date:  09/12/2018  Admitting Physician  Etta Quill, DO  Discharge Date:  09/15/2018   Primary MD  [name], NP  Recommendations for primary care physician for things to follow:     --Transfer to inpatient psychiatric service--further management by psychiatrist --Insulin regimen as ordered including sliding scale - NovoLog insulin sliding scale 0-15  Units, Subcutaneous, 3 times daily with meals, First dose on Mon 09/13/18 at 1200 Correction coverage: Moderate (average weight, post-op) CBG < 70: Implement Hypoglycemia Standing Orders and refer to Hypoglycemia Standing Orders sidebar report CBG 70 - 120: 0 units CBG 121 - 150: 2 units CBG 151 - 200: 3 units CBG 201 - 250: 5 units CBG 251 - 300: 8 units CBG 301 - 350: 11 units CBG 351 - 400: 15 units CBG > 400: call MD  Admission Diagnosis  Suicidal ideation (R45.851) Tachycardia (R00.0) AKI (acute kidney injury) ([place]) (N17.9) Diabetic ketoacidosis without coma associated with type 2 diabetes mellitus ([place]) (E11.10)  Discharge Diagnosis  Suicidal ideation (R45.851) Tachycardia (R00.0) AKI (acute kidney injury) ([place]) (N17.9) Diabetic ketoacidosis without coma associated with type 2 diabetes mellitus ([place]) (E11.10)    Principal Problem:   MDD (major depressive disorder), recurrent severe, without psychosis ([place]) Active Problems:   Non compliance with medical treatment   Sinus tachycardia by electrocardiogram   Chronic abdominal pain   DKA, type 1 ([place])   Suicidal ideations        Past Medical History: Diagnosis Date  Acanthosis nigricans   Anxiety   CHF (congestive heart failure) (HCC)   Chronic lower back pain   Depression  Dyspepsia   Obesity   Ovarian cyst   pt is not aware of this hx (11/24/2017)  Pre-diabetes   Precocious adrenarche (HCC)   Premature baby   Seizures ([place])   Type II diabetes mellitus (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  Patient presents to ED via EMS with multiple complaints:  First she reports black tarry stool for the past 2 days, frequent episodes of vomiting, some of the vomiting episodes are bright red, and some dark, has diffuse abd pain and progressive SOB over past few days.  Second, she reports having suicidal thoughts ongoing for past several months.  Third she has elevated blood sugars.  No fevers, chills, back pain, dysuria, vaginal bleeding, hematuria, chest pain, palpitations.  ED Course:Tachycardic to 140s. Mild DKA with 20 keytones in urine, AG of 16.  BNP nl  WBC 11.7.  UDS neg, tylenol and salicylate levels normal.  Ph 7.51 on ABG, O2 sat 99% Mildly hyperventilating in the upper 20s.  Had a negative PE study x2 days ago on 9/4.  COVID test pending, CXR pending.  Review of Systems: As per HPI, otherwise all review of systems negative.    Hospital Course:  -Brief summary -18 y.o.femalewith medical history significant ofIDDM2, psychogenic nonepileptic seizures, chronic abd pain due to suspected somatoform disorder, anxiety and depressionadmitted on 09/13/2018 with multiple psychosomatic complaints and found to have mild DKA with anion gap of 16 -DKA symptomatology has since resolved -Patient is medically stable at this time awaiting transfer to inpatient psychiatric unit  A/p 1)Depressive disorder with suicidal ideation--Pt is medicallystable --Medically cleared,--Okay to transfer to inpatient psychiatric services if bedisavailable -Psychiatric consult appreciated -Psychiatrist recommends IVC if patient refuses to go to psychiatric facility voluntarily -Continue Lexapro 20 mg daily, Prozac 20 mg daily, Lamictal 25 mg twice daily, Seroquel 400 mg nightly and Topamax 25 mg twice daily as well as trazodone 300 mg nightly -Watch  for possible excessive sedation  2)DM1-recent A1c is 12.9, overall poorly controlled,DKA resolved -Increase Lantus to 52 units daily,  Use Novolog/Humalog Sliding scale insulin with Accu-Cheks/Fingersticks as ordered  3)Constipation--- No BM in at least 4 days, lactulose given x1, placed on daily MiraLAX and Senokot-S nightly         The patient currently is running glucose is anywhere from the 200-85 range at home   Below is the medication list as outlined from the discharge STOP taking these medications  acetaminophen 325 MG tablet Commonly known as: TYLENOL  gabapentin 300 MG capsule Commonly known as: NEURONTIN  prochlorperazine 10 MG tablet Commonly known as: COMPAZINE  propranolol 20 MG tablet Commonly known as: INDERAL  QUEtiapine 100 MG tablet Commonly known as: SEROQUEL  senna-docusate 8.6-50 MG tablet Commonly known as: Senokot-S   TAKE these medications    Indication ARIPiprazole 10 MG tablet Commonly known as: ABILIFY Take 1 tablet (10 mg total) by mouth daily. Start taking on: September 18, 2018  Indication: Major Depressive Disorder  atorvastatin 20 MG tablet Commonly known  as: LIPITOR Take 1 tablet (20 mg total) by mouth daily at 6 PM. What changed: when to take this  Indication: High Amount of Fats in the Blood  escitalopram 20 MG tablet Commonly known as: LEXAPRO Take 1 tablet (20 mg total) by mouth daily.  Indication: Major Depressive Disorder  FLUoxetine 40 MG capsule Commonly known as: PROZAC Take 1 capsule (40 mg total) by mouth at bedtime. What changed:   medication strength  how much to take  additional instructions  Indication: Depression, Major Depressive Disorder  glucagon 1 MG injection Use for Severe Hypoglycemia . Inject 1 mg intramuscularly if unresponsive, unable to swallow, unconscious and/or has seizure What changed:   how much to take  how to take this  when to take this  reasons to take  this  additional instructions  Indication: Disorder with Low Blood Sugar  hydrOXYzine 25 MG tablet Commonly known as: ATARAX/VISTARIL Take 1 tablet (25 mg total) by mouth 3 (three) times daily as needed for anxiety.  Indication: Feeling Anxious  insulin aspart 100 UNIT/ML injection Commonly known as: novoLOG Inject 5 Units into the skin 3 (three) times daily with meals. What changed:   how much to take  additional instructions  Indication: Type 2 Diabetes  insulin glargine 100 UNIT/ML injection Commonly known as: LANTUS Inject 0.52 mLs (52 Units total) into the skin daily.  Indication: Insulin-Dependent Diabetes  lamoTRIgine 25 MG tablet Commonly known as: LAMICTAL Take 1 tablet (25 mg total) by mouth 2 (two) times daily.  Indication: Manic-Depression  multivitamin with minerals Tabs tablet Take 1 tablet by mouth daily. Start taking on: September 18, 2018  Indication: Vitamin and/or Mineral Deficiency  polyethylene glycol 17 g packet Commonly known as: MIRALAX / GLYCOLAX Take 17 g by mouth daily.  Indication: Constipation  topiramate 25 MG tablet Commonly known as: TOPAMAX Take 1 tablet (25 mg total) by mouth 2 (two) times daily. What changed: additional instructions  Indication: Partial Onset Seizure  trazodone 300 MG tablet Commonly known as: DESYREL Take 1 tablet (300 mg total) by mouth at bedtime.  Indication: Trouble Sleeping   Since discharge blood sugars have been in the 120s according to the patient but she did not bring her meter in the show.  Patient also has seen gastroenterology for chronic nausea and vomiting.  She had an upper endoscopy in 2019 which showed retained fluid in the stomach.  A gastric emptying study was recommended but never followed through upon  The pt is not suicidal today  Past Medical History:  Diagnosis Date   Acanthosis nigricans    Anxiety    CHF (congestive heart failure) (HCC)    Chronic lower  back pain    Depression    DKA, type 1 ([place]) 09/13/2018   Dyspepsia    Obesity    Ovarian cyst    pt is not aware of this hx (11/24/2017)   Pre-diabetes    Precocious adrenarche ([place])    Premature baby    Seizures ([place])    Type II diabetes mellitus ([place])    insulin dependant     Family History  Problem Relation Age of Onset   Diabetes Mother    Hypertension Mother    Obesity Mother    Asthma Mother    Allergic rhinitis Mother    Eczema Mother    Diabetes Father    Hypertension Father    Obesity Father    Hyperlipidemia Father    Hypertension Paternal Architectural technologist  Hypertension Maternal Grandfather    Colon cancer Maternal Grandfather    Diabetes Paternal Grandmother    Obesity Paternal Grandmother    Diabetes Paternal Grandfather    Obesity Paternal Grandfather    Angioedema Neg Hx    Immunodeficiency Neg Hx    Urticaria Neg Hx    Stomach cancer Neg Hx    Esophageal cancer Neg Hx      Social History   Socioeconomic History   Marital status: Single    Spouse name: Not on file   Number of children: 0   Years of education: Not on file   Highest education level: Not on file  Occupational History   Occupation: Admission  Social Needs   Financial resource strain: Not on file   Food insecurity    Worry: Not on file    Inability: Not on file   Transportation needs    Medical: Not on file    Non-medical: Not on file  Tobacco Use   Smoking status: Never Smoker   Smokeless tobacco: Never Used  Substance and Sexual Activity   Alcohol use: No    Alcohol/week: 0.0 standard drinks   Drug use: No   Sexual activity: Never  Lifestyle   Physical activity    Days per week: Not on file    Minutes per session: Not on file   Stress: Not on file  Relationships   Social connections    Talks on phone: Not on file    Gets together: Not on file    Attends religious service: Not on file    Active member of club or organization:  Not on file    Attends meetings of clubs or organizations: Not on file    Relationship status: Not on file   Intimate partner violence    Fear of current or ex partner: Not on file    Emotionally abused: Not on file    Physically abused: Not on file    Forced sexual activity: Not on file  Other Topics Concern   Not on file  Social History Narrative   Lives with mom and mom's girlfriend. Dad involved. 9th grade [employer].     Allergies  Allergen Reactions   Ibuprofen Other (See Comments)    GI MD said to not take this anymore     Outpatient Medications Prior to Visit  Medication Sig Dispense Refill   glucagon 1 MG injection Use for Severe Hypoglycemia . Inject 1 mg intramuscularly if unresponsive, unable to swallow, unconscious and/or has seizure (Patient taking differently: Inject 1 mg into the muscle once as needed (for severe hypoglycemia- if unresponsive, unable to swallow, unconscious, and/or has had seizure(s)). ) 2 each 5   Multiple Vitamin (MULTIVITAMIN WITH MINERALS) TABS tablet Take 1 tablet by mouth daily. 30 tablet 1   polyethylene glycol (MIRALAX / GLYCOLAX) 17 g packet Take 17 g by mouth daily. 30 each 2   ARIPiprazole (ABILIFY) 10 MG tablet Take 1 tablet (10 mg total) by mouth daily. 30 tablet 1   atorvastatin (LIPITOR) 20 MG tablet Take 1 tablet (20 mg total) by mouth daily at 6 PM. 30 tablet 1   Divalproex Sodium (DEPAKOTE ER PO) Take by mouth.     escitalopram (LEXAPRO) 20 MG tablet Take 1 tablet (20 mg total) by mouth daily. 30 tablet 1   FLUoxetine (PROZAC) 40 MG capsule Take 1 capsule (40 mg total) by mouth at bedtime. 30 capsule 1   hydrOXYzine (ATARAX/VISTARIL) 25 MG  tablet Take 1 tablet (25 mg total) by mouth 3 (three) times daily as needed for anxiety. 60 tablet 1   insulin glargine (LANTUS) 100 UNIT/ML injection Inject 0.52 mLs (52 Units total) into the skin daily. (Patient taking differently: Inject 52 Units into the skin at bedtime. )  10 mL 1   insulin lispro (HUMALOG) 100 UNIT/ML injection Inject 15 Units into the skin 2 (two) times daily with a meal.      lamoTRIgine (LAMICTAL) 25 MG tablet Take 1 tablet (25 mg total) by mouth 2 (two) times daily. (Patient taking differently: Take 25 mg by mouth daily. ) 60 tablet 1   levETIRAcetam (KEPPRA) 500 MG tablet Take 500 mg by mouth 2 (two) times daily.     QUEtiapine (SEROQUEL) 300 MG tablet Take 600 mg by mouth at bedtime.     topiramate (TOPAMAX) 25 MG tablet Take 1 tablet (25 mg total) by mouth 2 (two) times daily. 60 tablet 1   traZODone (DESYREL) 300 MG tablet Take 1 tablet (300 mg total) by mouth at bedtime. 30 tablet 1   insulin aspart (NOVOLOG) 100 UNIT/ML injection Inject 5 Units into the skin 3 (three) times daily with meals. (Patient not taking: Reported on 10/05/2018) 10 mL 1   No facility-administered medications prior to visit.      Review of Systems Constitutional:   No  weight loss, night sweats,  Fevers, chills, fatigue, lassitude. HEENT:   No headaches,  Difficulty swallowing,  Tooth/dental problems,  Sore throat,                No sneezing, itching, ear ache, nasal congestion, post nasal drip,   CV:  No chest pain,  Orthopnea, PND, swelling in lower extremities, anasarca, dizziness, palpitations  GI   heartburn, indigestion, abdominal pain, nausea, vomiting, diarrhea, change in bowel habits, loss of appetite  Resp: No shortness of breath with exertion or at rest.  No excess mucus, no productive cough,  No non-productive cough,  No coughing up of blood.  No change in color of mucus.  No wheezing.  No chest wall deformity  Skin: no rash or lesions.  GU: no dysuria, change in color of urine, no urgency or frequency.  No flank pain.  MS:  No joint pain or swelling.  No decreased range of motion.  No back pain.  Psych:  change in mood or affect. No depression or anxiety.  No memory loss.     Objective:   Physical Exam Vitals:   10/05/18 1024   BP: (!) 136/93  Pulse: (!) 101  Temp: 99 F (37.2 C)  TempSrc: Oral  SpO2: 100%  Weight: 197 lb (89.4 kg)  Height: 5' 3"  (1.6 m)    Gen: Pleasant, well-nourished, in no distress,  normal affect  ENT: No lesions,  mouth clear,  oropharynx clear, no postnasal drip  Neck: No JVD, no TMG, no carotid bruits  Lungs: No use of accessory muscles, no dullness to percussion, clear without rales or rhonchi  Cardiovascular: RRR, heart sounds normal, no murmur or gallops, no peripheral edema  Abdomen: soft and NT, no HSM,  BS normal  Musculoskeletal: No deformities, no cyanosis or clubbing  Neuro: alert, non focal  Skin: Warm, no lesions or rashes  BMP Latest Ref Rng & Units 10/05/2018 10/01/2018 09/29/2018  Glucose 65 - 99 mg/dL 223(H) 225(H) 308(H)  BUN 6 - 20 mg/dL 13 10 8   Creatinine 0.57 - 1.00 mg/dL 0.61 0.94 1.47(H)  BUN/Creat Ratio 9 -  23 21 - -  Sodium 134 - 144 mmol/L 139 139 138  Potassium 3.5 - 5.2 mmol/L 4.0 3.8 4.1  Chloride 96 - 106 mmol/L 99 102 101  CO2 20 - 29 mmol/L 22 28 26   Calcium 8.7 - 10.2 mg/dL 9.1 9.3 8.8(L)   Hepatic Function Latest Ref Rng & Units 10/05/2018 09/12/2018 09/10/2018  Total Protein 6.0 - 8.5 g/dL 7.2 6.8 6.0(L)  Albumin 3.9 - 5.0 g/dL 4.7 3.6 3.7  AST 0 - 40 IU/L 15 18 30   ALT 0 - 32 IU/L 19 14 12   Alk Phosphatase 39 - 117 IU/L 88 94 92  Total Bilirubin 0.0 - 1.2 mg/dL <0.2 0.7 0.7  Bilirubin, Direct 0.0 - 0.2 mg/dL - - -   CBC Latest Ref Rng & Units 10/05/2018 10/01/2018 09/29/2018  WBC 3.4 - 10.8 x10E3/uL 8.5 5.8 8.1  Hemoglobin 11.1 - 15.9 g/dL 12.8 12.7 13.3  Hematocrit 34.0 - 46.6 % 40.8 39.2 40.7  Platelets 150 - 450 x10E3/uL 329 277 318   A1C 11-12 over past year      Assessment & Plan:  I personally reviewed all images and lab data in the [facility] system as well as any outside material available during this office visit and agree with the  radiology impressions.   DKA, type 1 ([place]) Type 1 diabetes with recurrent DKA poorly  controlled  Lack of adherence to insulin regimen also  I had the patient meet with clinical pharmacy today and the plan will be to change to [place] 52 units daily discontinue Lantus and to be continued Humalog at 15 units twice daily with meals  Referral to endocrinology was made  DM (diabetes mellitus), type 1, uncontrolled (HCC) As per DKA assessment  Acute kidney injury ([place]) Acute kidney injury now resolved on current labs  Conversion disorder with attacks or seizures, acute episode, with psychological stressor No real evidence of actual seizure disorder that appears to be some type of conversion disorder with pseudoseizures  I suggested no further Keppra be taken for this patient and also question the use of Topamax  MDD (major depressive disorder), recurrent severe, without psychosis ([place]) Major depressive disorder  Will encourage follow-up with [facility] and continue Prozac for now but have discontinued the Lexapro I have also recommend she maintain the Abilify for now  Seizure ([place]) I am resolving seizure off the patient is probably still there is no documentation actually a seizure  Suicidal ideations History of suicidal ideations appears to be stable at this time  Non compliance with medical treatment Documentation of being discharged from of our practice due to medication nonadherence  Need to refer to another endocrinology practice  Chronic abdominal pain Chronic abdominal pain and also evidence of potential diabetic gastroparesis  Ultimately need to get the patient back to gastroenterology but may need a different practice as she has been discharged from the [place] practice   Shaylinn was seen today for hospitalization follow-up.  Diagnoses and all orders for this visit:  Type 1 diabetes mellitus with ketoacidosis without coma ([place]) -     Comprehensive metabolic panel -     CBC with Differential/Platelet; Future -     Ambulatory referral to  Endocrinology -     Lipid panel -     CBC with Differential/Platelet  MDD (major depressive disorder), recurrent severe, without psychosis ([place])  Depression with anxiety  Convulsions, unspecified convulsion type ([place])  Suicidal ideations  Chronic abdominal pain  Seizure ([place])  Adjustment disorder with  other symptom  Conversion disorder with attacks or seizures, acute episode, with psychological stressor  Essential hypertension  Uncontrolled type 1 diabetes mellitus with hyperglycemia ([place])  Acute kidney injury ([place])  Non compliance with medical treatment  Other orders -     ARIPiprazole (ABILIFY) 10 MG tablet; Take 1 tablet (10 mg total) by mouth daily. -     atorvastatin (LIPITOR) 20 MG tablet; Take 1 tablet (20 mg total) by mouth daily at 6 PM. -     Discontinue: escitalopram (LEXAPRO) 20 MG tablet; Take 1 tablet (20 mg total) by mouth daily. -     FLUoxetine (PROZAC) 40 MG capsule; Take 1 capsule (40 mg total) by mouth at bedtime. -     hydrOXYzine (ATARAX/VISTARIL) 25 MG tablet; Take 1 tablet (25 mg total) by mouth 3 (three) times daily as needed for anxiety. -     Discontinue: insulin glargine (LANTUS) 100 UNIT/ML injection; Inject 0.52 mLs (52 Units total) into the skin at bedtime. -     insulin lispro (HUMALOG) 100 UNIT/ML injection; Inject 0.15 mLs (15 Units total) into the skin 2 (two) times daily with a meal. -     lamoTRIgine (LAMICTAL) 25 MG tablet; Take 1 tablet (25 mg total) by mouth 2 (two) times daily. -     topiramate (TOPAMAX) 25 MG tablet; Take 1 tablet (25 mg total) by mouth 2 (two) times daily. -     Discontinue: trazodone (DESYREL) 300 MG tablet; Take 1 tablet (300 mg total) by mouth at bedtime. -     Insulin Degludec (TRESIBA) 100 UNIT/ML SOLN; Inject 52 Units into the skin daily. -     promethazine (PHENERGAN) 25 MG tablet; Take 1 tablet (25 mg total) by mouth every 8 (eight) hours as needed for nausea or vomiting.

## 2018-10-05 NOTE — Patient Instructions (Addendum)
Refills on your medicines were sent to the pharmacy here  Stop Lantus, start Tresbia 52 units daily     Please look at your medication list on your a VS to review all medicines you should actually be on at this time  We asked Franky Macho our clinical pharmacist to visit with you today for advice on your diabetes and have made a referral to endocrinology as well  Please keep your appointment at [facility] on Friday for your mental health needs  Flu vaccine and tetanus vaccine given  Please return in follow-up with Ms. Bertram Denver in 1 month  Diabetic diet below  Diabetes Mellitus and Nutrition, Adult When you have diabetes (diabetes mellitus), it is very important to have healthy eating habits because your blood sugar (glucose) levels are greatly affected by what you eat and drink. Eating healthy foods in the appropriate amounts, at about the same times every day, can help you:  Control your blood glucose.  Lower your risk of heart disease.  Improve your blood pressure.  Reach or maintain a healthy weight. Every person with diabetes is different, and each person has different needs for a meal plan. Your health care provider may recommend that you work with a diet and nutrition specialist (dietitian) to make a meal plan that is best for you. Your meal plan may vary depending on factors such as:  The calories you need.  The medicines you take.  Your weight.  Your blood glucose, blood pressure, and cholesterol levels.  Your activity level.  Other health conditions you have, such as heart or kidney disease. How do carbohydrates affect me? Carbohydrates, also called carbs, affect your blood glucose level more than any other type of food. Eating carbs naturally raises the amount of glucose in your blood. Carb counting is a method for keeping track of how many carbs you eat. Counting carbs is important to keep your blood glucose at a healthy level, especially if you use insulin or take  certain oral diabetes medicines. It is important to know how many carbs you can safely have in each meal. This is different for every person. Your dietitian can help you calculate how many carbs you should have at each meal and for each snack. Foods that contain carbs include:  Bread, cereal, rice, pasta, and crackers.  Potatoes and corn.  Peas, beans, and lentils.  Milk and yogurt.  Fruit and juice.  Desserts, such as cakes, cookies, ice cream, and candy. How does alcohol affect me? Alcohol can cause a sudden decrease in blood glucose (hypoglycemia), especially if you use insulin or take certain oral diabetes medicines. Hypoglycemia can be a life-threatening condition. Symptoms of hypoglycemia (sleepiness, dizziness, and confusion) are similar to symptoms of having too much alcohol. If your health care provider says that alcohol is safe for you, follow these guidelines:  Limit alcohol intake to no more than 1 drink per day for nonpregnant women and 2 drinks per day for men. One drink equals 12 oz of beer, 5 oz of wine, or 1 oz of hard liquor.  Do not drink on an empty stomach.  Keep yourself hydrated with water, diet soda, or unsweetened iced tea.  Keep in mind that regular soda, juice, and other mixers may contain a lot of sugar and must be counted as carbs. What are tips for following this plan?  Reading food labels  Start by checking the serving size on the "Nutrition Facts" label of packaged foods and drinks. The amount of  calories, carbs, fats, and other nutrients listed on the label is based on one serving of the item. Many items contain more than one serving per package.  Check the total grams (g) of carbs in one serving. You can calculate the number of servings of carbs in one serving by dividing the total carbs by 15. For example, if a food has 30 g of total carbs, it would be equal to 2 servings of carbs.  Check the number of grams (g) of saturated and trans fats in one  serving. Choose foods that have low or no amount of these fats.  Check the number of milligrams (mg) of salt (sodium) in one serving. Most people should limit total sodium intake to less than 2,300 mg per day.  Always check the nutrition information of foods labeled as "low-fat" or "nonfat". These foods may be higher in added sugar or refined carbs and should be avoided.  Talk to your dietitian to identify your daily goals for nutrients listed on the label. Shopping  Avoid buying canned, premade, or processed foods. These foods tend to be high in fat, sodium, and added sugar.  Shop around the outside edge of the grocery store. This includes fresh fruits and vegetables, bulk grains, fresh meats, and fresh dairy. Cooking  Use low-heat cooking methods, such as baking, instead of high-heat cooking methods like deep frying.  Cook using healthy oils, such as olive, canola, or sunflower oil.  Avoid cooking with butter, cream, or high-fat meats. Meal planning  Eat meals and snacks regularly, preferably at the same times every day. Avoid going long periods of time without eating.  Eat foods high in fiber, such as fresh fruits, vegetables, beans, and whole grains. Talk to your dietitian about how many servings of carbs you can eat at each meal.  Eat 4-6 ounces (oz) of lean protein each day, such as lean meat, chicken, fish, eggs, or tofu. One oz of lean protein is equal to: ? 1 oz of meat, chicken, or fish. ? 1 egg. ?  cup of tofu.  Eat some foods each day that contain healthy fats, such as avocado, nuts, seeds, and fish. Lifestyle  Check your blood glucose regularly.  Exercise regularly as told by your health care provider. This may include: ? 150 minutes of moderate-intensity or vigorous-intensity exercise each week. This could be brisk walking, biking, or water aerobics. ? Stretching and doing strength exercises, such as yoga or weightlifting, at least 2 times a week.  Take medicines  as told by your health care provider.  Do not use any products that contain nicotine or tobacco, such as cigarettes and e-cigarettes. If you need help quitting, ask your health care provider.  Work with a Veterinary surgeon or diabetes educator to identify strategies to manage stress and any emotional and social challenges. Questions to ask a health care provider  Do I need to meet with a diabetes educator?  Do I need to meet with a dietitian?  What number can I call if I have questions?  When are the best times to check my blood glucose? Where to find more information:  American Diabetes Association: diabetes.org  Academy of Nutrition and Dietetics: www.eatright.[employer] of Diabetes and Digestive and Kidney Diseases (NIH): CarFlippers.tn Summary  A healthy meal plan will help you control your blood glucose and maintain a healthy lifestyle.  Working with a diet and nutrition specialist (dietitian) can help you make a meal plan that is best for you.  Keep in mind that carbohydrates (carbs) and alcohol have immediate effects on your blood glucose levels. It is important to count carbs and to use alcohol carefully. This information is not intended to replace advice given to you by your health care provider. Make sure you discuss any questions you have with your health care provider. Document Released: 09/19/2004 Document Revised: 12/05/2016 Document Reviewed: 01/28/2016 Elsevier Patient Education  2020 [employer].

## 2018-10-06 ENCOUNTER — Encounter: Payer: Self-pay | Admitting: Critical Care Medicine

## 2018-10-06 LAB — CBC WITH DIFFERENTIAL/PLATELET
Basophils Absolute: 0.1 10*3/uL (ref 0.0–0.2)
Basos: 1 %
EOS (ABSOLUTE): 0.1 10*3/uL (ref 0.0–0.4)
Eos: 1 %
Hematocrit: 40.8 % (ref 34.0–46.6)
Hemoglobin: 12.8 g/dL (ref 11.1–15.9)
Immature Grans (Abs): 0 10*3/uL (ref 0.0–0.1)
Immature Granulocytes: 0 %
Lymphocytes Absolute: 2.5 10*3/uL (ref 0.7–3.1)
Lymphs: 29 %
MCH: 28 pg (ref 26.6–33.0)
MCHC: 31.4 g/dL — ABNORMAL LOW (ref 31.5–35.7)
MCV: 89 fL (ref 79–97)
Monocytes Absolute: 0.8 10*3/uL (ref 0.1–0.9)
Monocytes: 9 %
Neutrophils Absolute: 5.1 10*3/uL (ref 1.4–7.0)
Neutrophils: 60 %
Platelets: 329 10*3/uL (ref 150–450)
RBC: 4.57 x10E6/uL (ref 3.77–5.28)
RDW: 13.1 % (ref 11.7–15.4)
WBC: 8.5 10*3/uL (ref 3.4–10.8)

## 2018-10-06 LAB — COMPREHENSIVE METABOLIC PANEL
ALT: 19 IU/L (ref 0–32)
AST: 15 IU/L (ref 0–40)
Albumin/Globulin Ratio: 1.9 (ref 1.2–2.2)
Albumin: 4.7 g/dL (ref 3.9–5.0)
Alkaline Phosphatase: 88 IU/L (ref 39–117)
BUN/Creatinine Ratio: 21 (ref 9–23)
BUN: 13 mg/dL (ref 6–20)
Bilirubin Total: <0.2 mg/dL (ref 0.0–1.2)
CO2: 22 mmol/L (ref 20–29)
Calcium: 9.1 mg/dL (ref 8.7–10.2)
Chloride: 99 mmol/L (ref 96–106)
Creatinine, Ser: 0.61 mg/dL (ref 0.57–1.00)
GFR calc Af Amer: 151 mL/min/{1.73_m2} (ref >59)
GFR calc non Af Amer: 131 mL/min/{1.73_m2} (ref >59)
Globulin, Total: 2.5 g/dL (ref 1.5–4.5)
Glucose: 223 mg/dL — ABNORMAL HIGH (ref 65–99)
Potassium: 4 mmol/L (ref 3.5–5.2)
Sodium: 139 mmol/L (ref 134–144)
Total Protein: 7.2 g/dL (ref 6.0–8.5)

## 2018-10-06 LAB — LIPID PANEL
Chol/HDL Ratio: 2.3 ratio (ref 0.0–4.4)
Cholesterol, Total: 190 mg/dL (ref 100–199)
HDL: 84 mg/dL (ref >39)
LDL Chol Calc (NIH): 72 mg/dL (ref 0–99)
Triglycerides: 210 mg/dL — ABNORMAL HIGH (ref 0–149)
VLDL Cholesterol Cal: 34 mg/dL (ref 5–40)

## 2018-10-06 NOTE — Assessment & Plan Note (Signed)
Type 1 diabetes with recurrent DKA poorly controlled  Lack of adherence to insulin regimen also  I had the patient meet with clinical pharmacy today and the plan will be to change to [place] 52 units daily discontinue Lantus and to be continued Humalog at 15 units twice daily with meals  Referral to endocrinology was made

## 2018-10-06 NOTE — Assessment & Plan Note (Signed)
Chronic abdominal pain and also evidence of potential diabetic gastroparesis  Ultimately need to get the patient back to gastroenterology but may need a different practice as she has been discharged from the [place] practice

## 2018-10-06 NOTE — Assessment & Plan Note (Signed)
History of suicidal ideations appears to be stable at this time

## 2018-10-06 NOTE — Assessment & Plan Note (Signed)
Major depressive disorder  Will encourage follow-up with [facility] and continue Prozac for now but have discontinued the Lexapro I have also recommend she maintain the Abilify for now

## 2018-10-06 NOTE — Assessment & Plan Note (Signed)
I am resolving seizure off the patient is probably still there is no documentation actually a seizure

## 2018-10-06 NOTE — Assessment & Plan Note (Signed)
Documentation of being discharged from of our practice due to medication nonadherence  Need to refer to another endocrinology practice

## 2018-10-06 NOTE — Assessment & Plan Note (Signed)
Acute kidney injury now resolved on current labs

## 2018-10-06 NOTE — Assessment & Plan Note (Signed)
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

## 2018-10-06 NOTE — Assessment & Plan Note (Signed)
As per DKA assessment

## 2018-10-08 ENCOUNTER — Inpatient Hospital Stay (HOSPITAL_COMMUNITY)
Admission: EM | Admit: 2018-10-08 | Discharge: 2018-10-15 | DRG: 638 | Disposition: A | Discharge status: Home / Self Care | Payer: Medicaid Other | Attending: Internal Medicine | Admitting: Internal Medicine

## 2018-10-08 ENCOUNTER — Emergency Department (HOSPITAL_COMMUNITY): Payer: Medicaid Other

## 2018-10-08 ENCOUNTER — Other Ambulatory Visit: Payer: Self-pay

## 2018-10-08 ENCOUNTER — Encounter (HOSPITAL_COMMUNITY): Payer: Self-pay

## 2018-10-08 DIAGNOSIS — G479 Sleep disorder, unspecified: Secondary | ICD-10-CM | POA: Diagnosis present

## 2018-10-08 DIAGNOSIS — F332 Major depressive disorder, recurrent severe without psychotic features: Secondary | ICD-10-CM | POA: Diagnosis not present

## 2018-10-08 DIAGNOSIS — R1084 Generalized abdominal pain: Secondary | ICD-10-CM | POA: Diagnosis present

## 2018-10-08 DIAGNOSIS — Z915 Personal history of self-harm: Secondary | ICD-10-CM

## 2018-10-08 DIAGNOSIS — R9431 Abnormal electrocardiogram [ECG] [EKG]: Secondary | ICD-10-CM

## 2018-10-08 DIAGNOSIS — Z886 Allergy status to analgesic agent status: Secondary | ICD-10-CM

## 2018-10-08 DIAGNOSIS — E871 Hypo-osmolality and hyponatremia: Secondary | ICD-10-CM | POA: Diagnosis present

## 2018-10-08 DIAGNOSIS — E1010 Type 1 diabetes mellitus with ketoacidosis without coma: Principal | ICD-10-CM | POA: Diagnosis present

## 2018-10-08 DIAGNOSIS — F459 Somatoform disorder, unspecified: Secondary | ICD-10-CM | POA: Diagnosis present

## 2018-10-08 DIAGNOSIS — Z20828 Contact with and (suspected) exposure to other viral communicable diseases: Secondary | ICD-10-CM | POA: Diagnosis present

## 2018-10-08 DIAGNOSIS — Z8249 Family history of ischemic heart disease and other diseases of the circulatory system: Secondary | ICD-10-CM

## 2018-10-08 DIAGNOSIS — E1310 Other specified diabetes mellitus with ketoacidosis without coma: Secondary | ICD-10-CM

## 2018-10-08 DIAGNOSIS — G8929 Other chronic pain: Secondary | ICD-10-CM | POA: Diagnosis present

## 2018-10-08 DIAGNOSIS — F419 Anxiety disorder, unspecified: Secondary | ICD-10-CM | POA: Diagnosis present

## 2018-10-08 DIAGNOSIS — K5900 Constipation, unspecified: Secondary | ICD-10-CM | POA: Diagnosis present

## 2018-10-08 DIAGNOSIS — Z6835 Body mass index (BMI) 35.0-35.9, adult: Secondary | ICD-10-CM

## 2018-10-08 DIAGNOSIS — B3781 Candidal esophagitis: Secondary | ICD-10-CM | POA: Diagnosis present

## 2018-10-08 DIAGNOSIS — I509 Heart failure, unspecified: Secondary | ICD-10-CM | POA: Diagnosis present

## 2018-10-08 DIAGNOSIS — F341 Dysthymic disorder: Secondary | ICD-10-CM | POA: Diagnosis present

## 2018-10-08 DIAGNOSIS — E669 Obesity, unspecified: Secondary | ICD-10-CM | POA: Diagnosis present

## 2018-10-08 DIAGNOSIS — R112 Nausea with vomiting, unspecified: Secondary | ICD-10-CM

## 2018-10-08 DIAGNOSIS — Z818 Family history of other mental and behavioral disorders: Secondary | ICD-10-CM

## 2018-10-08 DIAGNOSIS — R000 Tachycardia, unspecified: Secondary | ICD-10-CM | POA: Diagnosis present

## 2018-10-08 DIAGNOSIS — Z79899 Other long term (current) drug therapy: Secondary | ICD-10-CM

## 2018-10-08 DIAGNOSIS — K2970 Gastritis, unspecified, without bleeding: Secondary | ICD-10-CM | POA: Diagnosis present

## 2018-10-08 DIAGNOSIS — R079 Chest pain, unspecified: Secondary | ICD-10-CM

## 2018-10-08 DIAGNOSIS — Z794 Long term (current) use of insulin: Secondary | ICD-10-CM

## 2018-10-08 DIAGNOSIS — Z833 Family history of diabetes mellitus: Secondary | ICD-10-CM

## 2018-10-08 DIAGNOSIS — I160 Hypertensive urgency: Secondary | ICD-10-CM

## 2018-10-08 DIAGNOSIS — L83 Acanthosis nigricans: Secondary | ICD-10-CM | POA: Diagnosis present

## 2018-10-08 DIAGNOSIS — I110 Hypertensive heart disease with heart failure: Secondary | ICD-10-CM | POA: Diagnosis present

## 2018-10-08 DIAGNOSIS — F445 Conversion disorder with seizures or convulsions: Secondary | ICD-10-CM | POA: Diagnosis present

## 2018-10-08 LAB — CBG MONITORING, ED
Glucose-Capillary: 337 mg/dL — ABNORMAL HIGH (ref 70–99)
Glucose-Capillary: 226 mg/dL — ABNORMAL HIGH (ref 70–99)
Glucose-Capillary: 248 mg/dL — ABNORMAL HIGH (ref 70–99)
Glucose-Capillary: 252 mg/dL — ABNORMAL HIGH (ref 70–99)
Glucose-Capillary: 155 mg/dL — ABNORMAL HIGH (ref 70–99)
Glucose-Capillary: 106 mg/dL — ABNORMAL HIGH (ref 70–99)

## 2018-10-08 LAB — URINALYSIS, ROUTINE W REFLEX MICROSCOPIC
Bilirubin Urine: NEGATIVE
Glucose, UA: >=500 mg/dL — AB
Hgb urine dipstick: NEGATIVE
Ketones, ur: 20 mg/dL — AB
Leukocytes,Ua: NEGATIVE
Nitrite: NEGATIVE
Protein, ur: NEGATIVE mg/dL
Specific Gravity, Urine: >1.046 — ABNORMAL HIGH (ref 1.005–1.030)
pH: 7 (ref 5.0–8.0)

## 2018-10-08 LAB — CBC
HCT: 40.4 % (ref 36.0–46.0)
Hemoglobin: 13.7 g/dL (ref 12.0–15.0)
MCH: 29.1 pg (ref 26.0–34.0)
MCHC: 33.9 g/dL (ref 30.0–36.0)
MCV: 86 fL (ref 80.0–100.0)
Platelets: 322 10*3/uL (ref 150–400)
RBC: 4.7 MIL/uL (ref 3.87–5.11)
RDW: 13.1 % (ref 11.5–15.5)
WBC: 9.7 10*3/uL (ref 4.0–10.5)
nRBC: 0 % (ref 0.0–0.2)

## 2018-10-08 LAB — BASIC METABOLIC PANEL
Anion gap: 16 — ABNORMAL HIGH (ref 5–15)
Anion gap: 12 (ref 5–15)
BUN: 11 mg/dL (ref 6–20)
BUN: 10 mg/dL (ref 6–20)
CO2: 21 mmol/L — ABNORMAL LOW (ref 22–32)
CO2: 20 mmol/L — ABNORMAL LOW (ref 22–32)
Calcium: 9.5 mg/dL (ref 8.9–10.3)
Calcium: 8.6 mg/dL — ABNORMAL LOW (ref 8.9–10.3)
Chloride: 97 mmol/L — ABNORMAL LOW (ref 98–111)
Chloride: 102 mmol/L (ref 98–111)
Creatinine, Ser: 0.84 mg/dL (ref 0.44–1.00)
Creatinine, Ser: 0.61 mg/dL (ref 0.44–1.00)
GFR calc Af Amer: >60 mL/min (ref >60)
GFR calc Af Amer: >60 mL/min (ref >60)
GFR calc non Af Amer: >60 mL/min (ref >60)
GFR calc non Af Amer: >60 mL/min (ref >60)
Glucose, Bld: 355 mg/dL — ABNORMAL HIGH (ref 70–99)
Glucose, Bld: 282 mg/dL — ABNORMAL HIGH (ref 70–99)
Potassium: 4.2 mmol/L (ref 3.5–5.1)
Potassium: 3.5 mmol/L (ref 3.5–5.1)
Sodium: 134 mmol/L — ABNORMAL LOW (ref 135–145)
Sodium: 134 mmol/L — ABNORMAL LOW (ref 135–145)

## 2018-10-08 LAB — I-STAT BETA HCG BLOOD, ED (MC, WL, AP ONLY): I-stat hCG, quantitative: <5 m[IU]/mL (ref <5)

## 2018-10-08 LAB — POC OCCULT BLOOD, ED: Fecal Occult Bld: NEGATIVE

## 2018-10-08 LAB — LIPASE, BLOOD: Lipase: 20 U/L (ref 11–51)

## 2018-10-08 MED ORDER — ONDANSETRON HCL 4 MG/2ML IJ SOLN
4.0000 mg | Freq: Four times a day (QID) | INTRAMUSCULAR | Status: DC | PRN
  Administered 2018-10-09: 4 mg via INTRAVENOUS
  Filled 2018-10-08 (×2): qty 2

## 2018-10-08 MED ORDER — IOHEXOL 300 MG/ML  SOLN
100.0000 mL | Freq: Once | INTRAMUSCULAR | Status: AC | PRN
  Administered 2018-10-08: 13:00:00 100 mL via INTRAVENOUS

## 2018-10-08 MED ORDER — MAGNESIUM SULFATE IN D5W 1-5 GM/100ML-% IV SOLN
1.0000 g | Freq: Once | INTRAVENOUS | Status: AC
  Administered 2018-10-08: 1 g via INTRAVENOUS
  Filled 2018-10-08: qty 100

## 2018-10-08 MED ORDER — PROMETHAZINE HCL 25 MG/ML IJ SOLN
12.5000 mg | Freq: Once | INTRAMUSCULAR | Status: AC
  Administered 2018-10-08: 12.5 mg via INTRAVENOUS
  Filled 2018-10-08: qty 1

## 2018-10-08 MED ORDER — SODIUM CHLORIDE 0.9 % IV SOLN
INTRAVENOUS | Status: DC
  Administered 2018-10-08 – 2018-10-14 (×13): via INTRAVENOUS

## 2018-10-08 MED ORDER — FLUOXETINE HCL 20 MG PO CAPS
40.0000 mg | ORAL_CAPSULE | Freq: Every day | ORAL | Status: DC
  Administered 2018-10-08 – 2018-10-14 (×7): 40 mg via ORAL
  Filled 2018-10-08 (×8): qty 2

## 2018-10-08 MED ORDER — INSULIN GLARGINE 100 UNIT/ML ~~LOC~~ SOLN
42.0000 [IU] | Freq: Every day | SUBCUTANEOUS | Status: DC
  Administered 2018-10-08: 21 [IU] via SUBCUTANEOUS
  Filled 2018-10-08 (×3): qty 0.42

## 2018-10-08 MED ORDER — LABETALOL HCL 5 MG/ML IV SOLN
5.0000 mg | INTRAVENOUS | Status: DC | PRN
  Administered 2018-10-11: 5 mg via INTRAVENOUS
  Filled 2018-10-08: qty 4

## 2018-10-08 MED ORDER — LAMOTRIGINE 25 MG PO TABS
25.0000 mg | ORAL_TABLET | Freq: Two times a day (BID) | ORAL | Status: DC
  Administered 2018-10-08 – 2018-10-15 (×13): 25 mg via ORAL
  Filled 2018-10-08 (×15): qty 1

## 2018-10-08 MED ORDER — ONDANSETRON HCL 4 MG PO TABS: 4.0000 mg | ORAL_TABLET | Freq: Four times a day (QID) | ORAL | Status: DC | PRN

## 2018-10-08 MED ORDER — SODIUM CHLORIDE 0.9% FLUSH
3.0000 mL | Freq: Once | INTRAVENOUS | Status: AC
  Administered 2018-10-08: 3 mL via INTRAVENOUS

## 2018-10-08 MED ORDER — HYDROXYZINE HCL 25 MG PO TABS
25.0000 mg | ORAL_TABLET | Freq: Three times a day (TID) | ORAL | Status: DC | PRN
  Administered 2018-10-09 – 2018-10-14 (×11): 25 mg via ORAL
  Filled 2018-10-08 (×11): qty 1

## 2018-10-08 MED ORDER — ONDANSETRON HCL 4 MG/2ML IJ SOLN
4.0000 mg | Freq: Once | INTRAMUSCULAR | Status: AC
  Administered 2018-10-08: 4 mg via INTRAVENOUS
  Filled 2018-10-08: qty 2

## 2018-10-08 MED ORDER — INSULIN ASPART 100 UNIT/ML ~~LOC~~ SOLN
15.0000 [IU] | Freq: Two times a day (BID) | SUBCUTANEOUS | Status: DC
  Administered 2018-10-08 – 2018-10-15 (×6): 15 [IU] via SUBCUTANEOUS

## 2018-10-08 MED ORDER — TRAZODONE HCL 50 MG PO TABS
300.0000 mg | ORAL_TABLET | Freq: Every day | ORAL | Status: DC
  Administered 2018-10-08 – 2018-10-10 (×3): 300 mg via ORAL
  Filled 2018-10-08 (×3): qty 6

## 2018-10-08 MED ORDER — INSULIN ASPART 100 UNIT/ML ~~LOC~~ SOLN
0.0000 [IU] | Freq: Three times a day (TID) | SUBCUTANEOUS | Status: DC
  Administered 2018-10-08: 5 [IU] via SUBCUTANEOUS
  Administered 2018-10-09: 2 [IU] via SUBCUTANEOUS
  Administered 2018-10-09: 3 [IU] via SUBCUTANEOUS
  Administered 2018-10-10: 2 [IU] via SUBCUTANEOUS
  Administered 2018-10-10: 1 [IU] via SUBCUTANEOUS
  Administered 2018-10-10 – 2018-10-11 (×4): 2 [IU] via SUBCUTANEOUS
  Administered 2018-10-12: 1 [IU] via SUBCUTANEOUS
  Administered 2018-10-12 – 2018-10-13 (×2): 2 [IU] via SUBCUTANEOUS
  Administered 2018-10-14: 3 [IU] via SUBCUTANEOUS
  Administered 2018-10-14: 1 [IU] via SUBCUTANEOUS
  Administered 2018-10-15: 3 [IU] via SUBCUTANEOUS

## 2018-10-08 MED ORDER — SODIUM CHLORIDE 0.9 % IV BOLUS
1000.0000 mL | Freq: Once | INTRAVENOUS | Status: AC
  Administered 2018-10-08: 1000 mL via INTRAVENOUS

## 2018-10-08 MED ORDER — TOPIRAMATE 25 MG PO TABS
25.0000 mg | ORAL_TABLET | Freq: Two times a day (BID) | ORAL | Status: DC
  Administered 2018-10-08 – 2018-10-15 (×13): 25 mg via ORAL
  Filled 2018-10-08 (×14): qty 1

## 2018-10-08 MED ORDER — INSULIN ASPART 100 UNIT/ML ~~LOC~~ SOLN
6.0000 [IU] | Freq: Once | SUBCUTANEOUS | Status: AC
  Administered 2018-10-08: 6 [IU] via INTRAVENOUS

## 2018-10-08 MED ORDER — MORPHINE SULFATE (PF) 4 MG/ML IV SOLN
4.0000 mg | Freq: Once | INTRAVENOUS | Status: AC
  Administered 2018-10-08: 4 mg via INTRAVENOUS
  Filled 2018-10-08: qty 1

## 2018-10-08 MED ORDER — ACETAMINOPHEN 325 MG PO TABS
650.0000 mg | ORAL_TABLET | Freq: Four times a day (QID) | ORAL | Status: DC | PRN
  Administered 2018-10-09 – 2018-10-13 (×2): 650 mg via ORAL
  Filled 2018-10-08 (×3): qty 2

## 2018-10-08 MED ORDER — ALBUTEROL SULFATE (2.5 MG/3ML) 0.083% IN NEBU: 2.5000 mg | INHALATION_SOLUTION | RESPIRATORY_TRACT | Status: DC | PRN

## 2018-10-08 MED ORDER — INSULIN DEGLUDEC 100 UNIT/ML ~~LOC~~ SOPN: 42.0000 [IU] | PEN_INJECTOR | Freq: Every day | SUBCUTANEOUS | Status: DC

## 2018-10-08 MED ORDER — ARIPIPRAZOLE 10 MG PO TABS
20.0000 mg | ORAL_TABLET | Freq: Every day | ORAL | Status: DC
  Administered 2018-10-08 – 2018-10-11 (×4): 20 mg via ORAL
  Filled 2018-10-08 (×4): qty 2

## 2018-10-08 MED ORDER — ENOXAPARIN SODIUM 40 MG/0.4ML ~~LOC~~ SOLN
40.0000 mg | SUBCUTANEOUS | Status: DC
  Filled 2018-10-08: qty 0.4

## 2018-10-08 MED ORDER — ACETAMINOPHEN 650 MG RE SUPP: 650.0000 mg | Freq: Four times a day (QID) | RECTAL | Status: DC | PRN

## 2018-10-08 MED ORDER — SODIUM CHLORIDE 0.9% FLUSH
3.0000 mL | Freq: Two times a day (BID) | INTRAVENOUS | Status: DC
  Administered 2018-10-08 – 2018-10-11 (×7): 3 mL via INTRAVENOUS

## 2018-10-08 MED ORDER — ATORVASTATIN CALCIUM 10 MG PO TABS
20.0000 mg | ORAL_TABLET | Freq: Every day | ORAL | Status: DC
  Administered 2018-10-08 – 2018-10-14 (×7): 20 mg via ORAL
  Filled 2018-10-08 (×7): qty 2

## 2018-10-08 MED ORDER — POTASSIUM CHLORIDE CRYS ER 20 MEQ PO TBCR
40.0000 meq | EXTENDED_RELEASE_TABLET | ORAL | Status: AC
  Administered 2018-10-08: 22:00:00 40 meq via ORAL
  Filled 2018-10-08: qty 2

## 2018-10-08 NOTE — ED Triage Notes (Signed)
EMS adm 4mg  of IV zofran. BP: 177/111, HR-110 ST, CBG 342, O2-100% RA

## 2018-10-08 NOTE — ED Triage Notes (Addendum)
PER EMS: pt from home with c/o syncope, nausea and generalized lower abd pain. Pt states she has had blood in her vomit "like coffee grounds" and in her stool x 3 days. EMS states they found the pt responsive but laying on floor. She does not remember what happened, only remembers that she "passed out." A&OX4.

## 2018-10-08 NOTE — ED Notes (Signed)
Pharmacy has been notified to send scheduled meds Abilify and Lovenox

## 2018-10-08 NOTE — ED Provider Notes (Signed)
[facility] EMERGENCY DEPARTMENT Provider Note   CSN: [id number] Arrival date & time: 10/08/18  1041     History   Chief Complaint Chief Complaint  Patient presents with   Loss of Consciousness   Abdominal Pain    HPI Nancy [name] is a 18 y.o. female.     Patient is a 18 year old female with past medical history of type 1 diabetes, seizure disorder, anxiety, CHF, orthostatic hypotension, and chronic abdominal pain.  She presents today for evaluation of abdominal pain and vomiting.  I am told she was "found on the floor" prior to EMS being called.  Patient was initially unresponsive, but became responsive before EMS arrival.  Patient has no recollection of how she got there.  She does report several days of nausea, vomiting, and generalized abdominal pain.  She reports vomiting what she describes as "coffee ground material" and reports similar appearing stools.  Patient with multiple visits for syncope/seizure/possible pseudoseizure with extensive work-ups.  The history is provided by the patient.  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[place])    Premature baby    Seizures ([place])    Type II diabetes mellitus ([place])    insulin dependant    Patient Active Problem List   Diagnosis Date Noted   Conversion disorder with attacks or seizures, acute episode, with psychological stressor 09/16/2018   Suicidal ideations 09/13/2018   MDD (major depressive  disorder), recurrent severe, without psychosis ([place])    Syncope 01/30/2018   Orthostatic hypotension 01/24/2018   Chronic abdominal pain 12/24/2017   Chest pain 12/19/2017   Non compliance with medical treatment 01/27/2012   Adjustment disorder 09/16/2011   DM (diabetes mellitus), type 1, uncontrolled ([place]) 09/15/2011   Acanthosis nigricans    Goiter    Obesity 06/14/2010   Hypertension 06/14/2010    Past Surgical History:  Procedure Laterality Date   ABDOMINAL HERNIA REPAIR     "I was a baby"   [place] EXTRACTION  2017     OB History   No obstetric history on file.      Home Medications    Prior to Admission medications   Medication Sig Start Date End Date Taking? Authorizing Provider  ARIPiprazole (ABILIFY) 10 MG tablet Take 1 tablet (10 mg total) by mouth daily. 10/05/18   Elsie Stain, MD  atorvastatin (LIPITOR) 20 MG tablet Take 1 tablet (20 mg total) by mouth daily at 6 PM. 10/05/18   Elsie Stain, MD  FLUoxetine (PROZAC) 40 MG capsule Take 1 capsule (40 mg total) by mouth at bedtime. 10/05/18   Elsie Stain, MD  glucagon 1 MG injection Use for Severe Hypoglycemia . Inject 1 mg intramuscularly if unresponsive, unable to swallow, unconscious and/or has seizure Patient taking differently: Inject 1 mg into the muscle once as  needed (for severe hypoglycemia- if unresponsive, unable to swallow, unconscious, and/or has had seizure(s)).  07/20/13 12/19/25  Romero Belling, MD  hydrOXYzine (ATARAX/VISTARIL) 25 MG tablet Take 1 tablet (25 mg total) by mouth 3 (three) times daily as needed for anxiety. 10/05/18   Storm Frisk, MD  Insulin Degludec (TRESIBA) 100 UNIT/ML SOLN Inject 52 Units into the skin daily. 10/05/18   Storm Frisk, MD  insulin lispro (HUMALOG) 100 UNIT/ML injection Inject 0.15 mLs (15 Units total) into the skin 2 (two) times daily with a meal. 10/05/18   Storm Frisk,  MD  lamoTRIgine (LAMICTAL) 25 MG tablet Take 1 tablet (25 mg total) by mouth 2 (two) times daily. 10/05/18   Storm Frisk, MD  Multiple Vitamin (MULTIVITAMIN WITH MINERALS) TABS tablet Take 1 tablet by mouth daily. 09/18/18   Clapacs, Jackquline Denmark, MD  polyethylene glycol (MIRALAX / GLYCOLAX) 17 g packet Take 17 g by mouth daily. 09/17/18   Clapacs, Jackquline Denmark, MD  promethazine (PHENERGAN) 25 MG tablet Take 1 tablet (25 mg total) by mouth every 8 (eight) hours as needed for nausea or vomiting. 10/05/18   Storm Frisk, MD  topiramate (TOPAMAX) 25 MG tablet Take 1 tablet (25 mg total) by mouth 2 (two) times daily. 10/05/18   Storm Frisk, MD  traZODone (DESYREL) 100 MG tablet Take 3 tablets (300 mg total) by mouth at bedtime. 10/05/18   Hoy Register, MD    Family History Family History  Problem Relation Age of Onset   Diabetes Mother    Hypertension Mother    Obesity Mother    Asthma Mother    Allergic rhinitis Mother    Eczema Mother    Diabetes Father    Hypertension Father    Obesity Father    Hyperlipidemia Father    Hypertension Paternal Aunt    Hypertension Maternal Grandfather    Colon cancer Maternal Grandfather    Diabetes Paternal Grandmother    Obesity Paternal Grandmother    Diabetes Paternal Grandfather    Obesity Paternal Grandfather    Angioedema Neg Hx    Immunodeficiency Neg Hx    Urticaria Neg Hx    Stomach cancer Neg Hx    Esophageal cancer Neg Hx     Social History Social History   Tobacco Use   Smoking status: Never Smoker   Smokeless tobacco: Never Used  Substance Use Topics   Alcohol use: No    Alcohol/week: 0.0 standard drinks   Drug use: No     Allergies   Ibuprofen   Review of Systems Review of Systems  Cardiovascular: Positive for syncope.  Gastrointestinal: Positive for abdominal pain.  All other systems reviewed and are negative.    Physical Exam Updated Vital Signs BP (!) 148/95    Pulse 100    Temp  98.5 F (36.9 C) (Oral)    Resp (!) 29    Ht 5' 2.99" (1.6 m)    Wt 89.8 kg    LMP 09/27/2018 (Approximate)    SpO2 100%    BMI 35.08 kg/m   Physical Exam Vitals signs and nursing note reviewed.  Constitutional:      General: She is not in acute distress.    Appearance: She is well-developed. She is not diaphoretic.  HENT:     Head: Normocephalic and atraumatic.  Neck:     Musculoskeletal: Normal range of motion and neck supple.  Cardiovascular:     Rate and Rhythm: Normal rate and  regular rhythm.     Heart sounds: No murmur. No friction rub. No gallop.   Pulmonary:     Effort: Pulmonary effort is normal. No respiratory distress.     Breath sounds: Normal breath sounds. No wheezing.  Abdominal:     General: Bowel sounds are normal. There is no distension.     Palpations: Abdomen is soft.     Tenderness: There is generalized abdominal tenderness. There is no right CVA tenderness, left CVA tenderness, guarding or rebound.  Musculoskeletal: Normal range of motion.  Skin:    General: Skin is warm and dry.  Neurological:     Mental Status: She is alert and oriented to person, place, and time.      ED Treatments / Results  Labs (all labs ordered are listed, but only abnormal results are displayed) Labs Reviewed  CBG MONITORING, ED - Abnormal; Notable for the following components:      Result Value   Glucose-Capillary 337 (*)    All other components within normal limits  CBC  BASIC METABOLIC PANEL  URINALYSIS, ROUTINE W REFLEX MICROSCOPIC  LIPASE, BLOOD  I-STAT BETA HCG BLOOD, ED ([facility], [facility], [facility] ONLY)  POC OCCULT BLOOD, ED  I-STAT BETA HCG BLOOD, ED ([facility], [facility], [facility] ONLY)    EKG None  Radiology No results found.  Procedures Procedures (including critical care time)  Medications Ordered in ED Medications  sodium chloride 0.9 % bolus 1,000 mL (has no administration in time range)  ondansetron (ZOFRAN) injection 4 mg (has no administration in time range)  morphine 4 MG/ML  injection 4 mg (has no administration in time range)  insulin aspart (novoLOG) injection 6 Units (has no administration in time range)  sodium chloride flush (NS) 0.9 % injection 3 mL (3 mLs Intravenous Given 10/08/18 1108)     Initial Impression / Assessment and Plan / ED Course  I have reviewed the triage vital signs and the nursing notes.  Pertinent labs & imaging results that were available during my care of the patient were reviewed by me and considered in my medical decision making (see chart for details).  Patient with history of type 1 diabetes and multiple other medical issues presenting with complaints of nausea, vomiting, and abdominal pain.  The cause of the patient's abdominal pain is unknown as her CT scan is unremarkable.  Her electrolytes are consistent with a possible trend towards DKA.  Patient not feeling better after receiving fluids and insulin and medications.  Will discuss admission with Dr. Katrinka Blazing from [facility].    CRITICAL CARE Performed by: Geoffery Lyons Total critical care time: 35 minutes Critical care time was exclusive of separately billable procedures and treating other patients. Critical care was necessary to treat or prevent imminent or life-threatening deterioration. Critical care was time spent personally by me on the following activities: development of treatment plan with patient and/or surrogate as well as nursing, discussions with consultants, evaluation of patient's response to treatment, examination of patient, obtaining history from patient or surrogate, ordering and performing treatments and interventions, ordering and review of laboratory studies, ordering and review of radiographic studies, pulse oximetry and re-evaluation of patient's condition.   Final Clinical Impressions(s) / ED Diagnoses   Final diagnoses:  None    ED Discharge Orders    None       Geoffery Lyons, MD 10/08/18 1440

## 2018-10-08 NOTE — H&P (Signed)
History and Physical    Nancy ParadiseRashonda N Mayers ZOX:[id number] DOB: [birth year] DOA: 10/08/2018  Referring MD/NP/PA: Geoffery Lyonsouglas Delo, MD PCP: Claiborne RiggFleming, Zelda W, NP  Patient coming from: Home via EMS  Chief Complaint: Abdominal pain and nausea  I have personally briefly reviewed patient[facility]   HPI: Nancy [name] is a 18 y.o. 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  Reportedly, the patient was found down on the floor by family and was unresponsive.  She does not recall how she ended up on the floor, but states that she has been having seizures.  However, appears to have been taken off Keppra because it was "too strong".  Complains of throwing up blood and her "stomach has been all messed up".  Episodes of emesis were noted to be bright red and coffee-ground discusses previous evaluations by [facility] where she notes being worked up and told that she did not have gastroparesis.  Associated symptoms include constipation. Patient still reports being able to pass flatus.  She had been taking her Lantus 52 units at night, but reports that she had not been taking the Humalog with meals because she was unable to keep any significant amount of food down.  ED Course: Upon admission into the emergency department patient was noted to be, pulse 97 116, respirations 16-29, blood pressure 147/112-161/106.  Labs significant for sodium 134, CO2 21, glucose 355, lipase 20, and anion gap 16.  Urinalysis was positive for greater than 500 glucose, 20 ketones, and specific gravity of >1.046.  There is no signs of infection in the urinalysis.  Fecal occult testing was negative.  Patient was given 6 units of insulin, antiemetics, morphine, and 1 L of normal saline IV  fluids.  TRH called to admit for mild DKA.  Review of Systems  Constitutional: Positive for malaise/fatigue. Negative for fever and weight loss.  HENT: Negative for ear discharge and nosebleeds.   Eyes: Negative for photophobia and pain.  Respiratory: Negative for cough and shortness of breath.   Cardiovascular: Negative for chest pain and leg swelling.  Gastrointestinal: Positive for abdominal pain, constipation, nausea and vomiting. Negative for diarrhea.  Genitourinary: Negative for hematuria.  Musculoskeletal: Positive for falls.  Skin: Negative for rash.  Neurological: Positive for seizures and loss of consciousness.  Psychiatric/Behavioral: Negative for substance abuse.    Past Medical History:  Diagnosis Date   Acanthosis nigricans    Anxiety    CHF (congestive heart failure) (HCC)    Chronic lower back pain    Depression    DKA, type 1 (HCC) 09/13/2018   Dyspepsia    Obesity    Ovarian cyst    pt is not aware of this hx (11/24/2017)   Pre-diabetes    Precocious adrenarche (HCC)    Premature baby    Seizures (HCC)    Type II diabetes mellitus (HCC)    insulin dependant    Past Surgical History:  Procedure Laterality Date   ABDOMINAL HERNIA REPAIR     "I was a baby"   HERNIA REPAIR     TONSILLECTOMY AND ADENOIDECTOMY     WISDOM TOOTH EXTRACTION  2017     reports that she has never smoked. She has never used smokeless tobacco. She reports that she does not drink alcohol or use drugs.  Allergies  Allergen Reactions   Ibuprofen Other (See Comments)    GI MD said to not take this anymore    Family History  Problem Relation Age of Onset   Diabetes Mother    Hypertension Mother    Obesity Mother    Asthma Mother    Allergic rhinitis Mother    Eczema Mother    Diabetes Father    Hypertension Father    Obesity Father    Hyperlipidemia Father    Hypertension Paternal Aunt    Hypertension Maternal Grandfather    Colon cancer  Maternal Grandfather    Diabetes Paternal Grandmother    Obesity Paternal Grandmother    Diabetes Paternal Grandfather    Obesity Paternal Grandfather    Angioedema Neg Hx    Immunodeficiency Neg Hx    Urticaria Neg Hx    Stomach cancer Neg Hx    Esophageal cancer Neg Hx     Prior to Admission medications   Medication Sig Start Date End Date Taking? Authorizing Provider  ARIPiprazole (ABILIFY) 20 MG tablet Take 20 mg by mouth daily.   Yes [provider]  atorvastatin (LIPITOR) 20 MG tablet Take 1 tablet (20 mg total) by mouth daily at 6 PM. 10/05/18  Yes Elsie Stain, MD  FLUoxetine (PROZAC) 40 MG capsule Take 1 capsule (40 mg total) by mouth at bedtime. 10/05/18  Yes Elsie Stain, MD  glucagon 1 MG injection Use for Severe Hypoglycemia . Inject 1 mg intramuscularly if unresponsive, unable to swallow, unconscious and/or has seizure Patient taking differently: Inject 1 mg into the muscle once as needed (for severe hypoglycemia- if unresponsive, unable to swallow, unconscious, and/or has had seizure(s)).  07/20/13 12/19/25 Yes Renato Shin, MD  hydrOXYzine (ATARAX/VISTARIL) 25 MG tablet Take 1 tablet (25 mg total) by mouth 3 (three) times daily as needed for anxiety. 10/05/18  Yes Elsie Stain, MD  Insulin Degludec (TRESIBA) 100 UNIT/ML SOLN Inject 52 Units into the skin daily. 10/05/18  Yes Elsie Stain, MD  insulin lispro (HUMALOG) 100 UNIT/ML injection Inject 0.15 mLs (15 Units total) into the skin 2 (two) times daily with a meal. 10/05/18  Yes Elsie Stain, MD  lamoTRIgine (LAMICTAL) 25 MG tablet Take 1 tablet (25 mg total) by mouth 2 (two) times daily. 10/05/18  Yes Elsie Stain, MD  Multiple Vitamin (MULTIVITAMIN WITH MINERALS) TABS tablet Take 1 tablet by mouth daily. 09/18/18  Yes Clapacs, Madie Reno, MD  promethazine (PHENERGAN) 25 MG tablet Take 1 tablet (25 mg total) by mouth every 8 (eight) hours as needed for nausea or vomiting. 10/05/18  Yes  Elsie Stain, MD  topiramate (TOPAMAX) 25 MG tablet Take 1 tablet (25 mg total) by mouth 2 (two) times daily. 10/05/18  Yes Elsie Stain, MD  traZODone (DESYREL) 100 MG tablet Take 3 tablets (300 mg total) by mouth at bedtime. 10/05/18  Yes Charlott Rakes, MD    Physical Exam:  Constitutional: Young female who appears to be in some distress Vitals:   10/08/18 1130 10/08/18 1145 10/08/18 1200 10/08/18 1215  BP: (!) 161/101 (!) 154/97 (!) 159/103 (!) 150/94  Pulse: 99 (!) 108 (!) 107 (!) 116  Resp:      Temp:      TempSrc:      SpO2: 100% 100% 100% 100%  Weight:      Height:       Eyes: PERRL, lids and conjunctivae normal ENMT: Mucous membranes are dry. Posterior pharynx  clear of any exudate or lesions.  Neck: normal, supple, no masses, no thyromegaly Respiratory: clear to auscultation bilaterally, no wheezing, no crackles. Normal respiratory effort. No accessory muscle use.  Cardiovascular: Tachycardic, no murmurs / rubs / gallops. No extremity edema. 2+ pedal pulses. No carotid bruits.  Abdomen: no tenderness, no masses palpated. No hepatosplenomegaly. Bowel sounds positive.  Musculoskeletal: no clubbing / cyanosis. No joint deformity upper and lower extremities. Good ROM, no contractures. Normal muscle tone.  Skin: no rashes, lesions, ulcers. No induration Neurologic: CN 2-12 grossly intact. Sensation intact, DTR normal. Strength 5/5 in all 4.  Psychiatric: Normal judgment and insight. Alert and oriented x 3.  Anxious mood.     Labs on Admission: I have personally reviewed following labs and imaging studies  CBC: Recent Labs  Lab 10/01/18 1605 10/05/18 1133 10/08/18 1104  WBC 5.8 8.5 9.7  NEUTROABS 2.6 5.1  --   HGB 12.7 12.8 13.7  HCT 39.2 40.8 40.4  MCV 87.5 89 86.0  PLT 277 329 322   Basic Metabolic Panel: Recent Labs  Lab 10/01/18 1701 10/05/18 1133 10/08/18 1104  NA 139 139 134*  K 3.8 4.0 4.2  CL 102 99 97*  CO2 28 22 21*  GLUCOSE 225* 223*  355*  BUN 10 13 11   CREATININE 0.94 0.61 0.84  CALCIUM 9.3 9.1 9.5   GFR: Estimated Creatinine Clearance: 113.7 mL/min (by C-G formula based on SCr of 0.84 mg/dL). Liver Function Tests: Recent Labs  Lab 10/05/18 1133  AST 15  ALT 19  ALKPHOS 88  BILITOT <0.2  PROT 7.2  ALBUMIN 4.7   Recent Labs  Lab 10/08/18 1104  LIPASE 20   No results for input(s): AMMONIA in the last 168 hours. Coagulation Profile: No results for input(s): INR, PROTIME in the last 168 hours. Cardiac Enzymes: No results for input(s): CKTOTAL, CKMB, CKMBINDEX, TROPONINI in the last 168 hours. BNP (last 3 results) No results for input(s): PROBNP in the last 8760 hours. HbA1C: No results for input(s): HGBA1C in the last 72 hours. CBG: Recent Labs  Lab 10/01/18 1542 10/02/18 0004 10/08/18 1101 10/08/18 1358  GLUCAP 248* 268* 337* 226*   Lipid Profile: No results for input(s): CHOL, HDL, LDLCALC, TRIG, CHOLHDL, LDLDIRECT in the last 72 hours. Thyroid Function Tests: No results for input(s): TSH, T4TOTAL, FREET4, T3FREE, THYROIDAB in the last 72 hours. Anemia Panel: No results for input(s): VITAMINB12, FOLATE, FERRITIN, TIBC, IRON, RETICCTPCT in the last 72 hours. Urine analysis:    Component Value Date/Time   COLORURINE YELLOW 10/01/2018 1955   APPEARANCEUR CLOUDY (A) 10/01/2018 1955   LABSPEC 1.018 10/01/2018 1955   PHURINE 7.0 10/01/2018 1955   GLUCOSEU >=500 (A) 10/01/2018 1955   HGBUR SMALL (A) 10/01/2018 1955   BILIRUBINUR NEGATIVE 10/01/2018 1955   BILIRUBINUR negative 07/13/2018 1651   KETONESUR 5 (A) 10/01/2018 1955   PROTEINUR NEGATIVE 10/01/2018 1955   UROBILINOGEN 0.2 07/13/2018 1651   UROBILINOGEN 1.0 07/09/2017 1324   NITRITE NEGATIVE 10/01/2018 1955   LEUKOCYTESUR NEGATIVE 10/01/2018 1955   Sepsis Labs: No results found for this or any previous visit (from the past 240 hour(s)).   Radiological Exams on Admission: Ct Abdomen Pelvis W Contrast  Result Date:  10/08/2018 CLINICAL DATA:  Syncope in general abdominal pain today. The patient reports coffee ground emesis. EXAM: CT ABDOMEN AND PELVIS WITH CONTRAST TECHNIQUE: Multidetector CT imaging of the abdomen and pelvis was performed using the standard protocol following bolus administration of intravenous contrast. CONTRAST:  100  mL OMNIPAQUE IOHEXOL 300 MG/ML  SOLN COMPARISON:  CT abdomen and pelvis 07/25/2018. FINDINGS: Lower chest: Lung bases clear.  No pleural or pericardial effusion. Hepatobiliary: No focal liver abnormality is seen. No gallstones, gallbladder wall thickening, or biliary dilatation. Pancreas: Unremarkable. No pancreatic ductal dilatation or surrounding inflammatory changes. Spleen: Normal in size without focal abnormality. Adrenals/Urinary Tract: Adrenal glands are unremarkable. Kidneys are normal, without renal calculi, focal lesion, or hydronephrosis. Bladder is unremarkable. Stomach/Bowel: Adrenal glands are unremarkable. Kidneys are normal, without renal calculi, focal lesion, or hydronephrosis. Bladder is unremarkable. Vascular/Lymphatic: No significant vascular findings are present. No enlarged abdominal or pelvic lymph nodes. Reproductive: Uterus and bilateral adnexa are unremarkable. A cystic lesion in the superior aspect of the vagina on the right measuring 1.8 cm AP x 1.3 cm transverse x 0.9 cm craniocaudal is unchanged. Other: None. Musculoskeletal: Negative. IMPRESSION: No acute abnormality abdomen or pelvis. No finding to explain the patient's symptoms. 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 Electronically Signed   By: Drusilla Kanner M.D.   On: 10/08/2018 13:33    EKG: Independently reviewed. Sinus tachycardic at 124 bpm.  Assessment/Plan DKA, type I: Acute on chronic.  Patient presents with elevated blood glucose of 355 with CO2 21 and anion gap 16.  Last hemoglobin A1c was 12.9 on 07/13/2018. Patient with a  pseudo-hyponatremia of 134 which is within normal limits when corrected for hyperglycemia.  Patient had been given 1 L normal saline IV fluids and 6 units of insulin with repeat blood sugar 266. -Admit to a progressive bed  -Check repeat BMP stat -IV fluids normal saline at 100 mL/h -Continue pharmacy substitution of Humalog 15 units twice daily with meals -Decreased home Lantus down to 42 units while hospitalized -CBGs q. before meals with additional sliding scale insulin -Adjust insulin regimen as needed  Abdominal pain: Acute on chronic.  CT scan of the abdomen and pelvis did not show any acute abnormalities and noted a small cystic lesion in the right wall of the vagina thought to be a [employer] fast streamer ductal cysts unchanged. -Continue to monitor  Nausea and vomiting: Patient reports previously worked being worked up and negative for gastroparesis.  -Antiemetics as needed -IV fluids  Hypertensive urgency: Acute.  Blood pressures elevated up to 161/106 on admission.  Patient not on blood pressure medications at home. -Labetalol IV as needed  Sinus tachycardia: Acute on chronic.  Heart rates into the 120s on admission.  Patient previously was on propranolol in the past. -Need to monitor  Anxiety/depression: Patient follows in outpatient setting with [facility].  She was seen by PCP 3 days ago, and Lexapro was recommended to be discontinued by PCP and Seroquel was stopped. -Continue Abilify, Prozac, Lamictal, and hydroxyzine as needed -Continue outpatient follow-up with [facility] on October 19  Psychogenic seizures: Evaluated and previously noted not to have epileptiform seizures.  - Seizure precautions  Obesity: BMI 35.08 kg/m    DVT prophylaxis:lovenox Code Status: Full   Family Communication: No family present at bedside Disposition Plan: Possible discharge home in a.m. Consults called: None Admission status: Observation  Clydie Braun MD [facility] Pager  [phone]   If 7PM-7AM, please contact night-coverage [email or web address] Password [email or web address]  10/08/2018, 2:30 PM

## 2018-10-09 ENCOUNTER — Encounter (HOSPITAL_COMMUNITY): Payer: Self-pay | Admitting: Internal Medicine

## 2018-10-09 DIAGNOSIS — K920 Hematemesis: Secondary | ICD-10-CM

## 2018-10-09 DIAGNOSIS — Z20828 Contact with and (suspected) exposure to other viral communicable diseases: Secondary | ICD-10-CM | POA: Diagnosis present

## 2018-10-09 DIAGNOSIS — R1115 Cyclical vomiting syndrome unrelated to migraine: Secondary | ICD-10-CM | POA: Diagnosis not present

## 2018-10-09 DIAGNOSIS — Z6835 Body mass index (BMI) 35.0-35.9, adult: Secondary | ICD-10-CM | POA: Diagnosis not present

## 2018-10-09 DIAGNOSIS — K2970 Gastritis, unspecified, without bleeding: Secondary | ICD-10-CM | POA: Diagnosis present

## 2018-10-09 DIAGNOSIS — R000 Tachycardia, unspecified: Secondary | ICD-10-CM | POA: Diagnosis present

## 2018-10-09 DIAGNOSIS — R710 Precipitous drop in hematocrit: Secondary | ICD-10-CM

## 2018-10-09 DIAGNOSIS — E1010 Type 1 diabetes mellitus with ketoacidosis without coma: Secondary | ICD-10-CM | POA: Diagnosis present

## 2018-10-09 DIAGNOSIS — K229 Disease of esophagus, unspecified: Secondary | ICD-10-CM | POA: Diagnosis not present

## 2018-10-09 DIAGNOSIS — K5900 Constipation, unspecified: Secondary | ICD-10-CM | POA: Diagnosis present

## 2018-10-09 DIAGNOSIS — I160 Hypertensive urgency: Secondary | ICD-10-CM | POA: Diagnosis present

## 2018-10-09 DIAGNOSIS — G8929 Other chronic pain: Secondary | ICD-10-CM | POA: Diagnosis present

## 2018-10-09 DIAGNOSIS — I110 Hypertensive heart disease with heart failure: Secondary | ICD-10-CM | POA: Diagnosis present

## 2018-10-09 DIAGNOSIS — Z818 Family history of other mental and behavioral disorders: Secondary | ICD-10-CM | POA: Diagnosis not present

## 2018-10-09 DIAGNOSIS — F332 Major depressive disorder, recurrent severe without psychotic features: Secondary | ICD-10-CM | POA: Diagnosis present

## 2018-10-09 DIAGNOSIS — R55 Syncope and collapse: Secondary | ICD-10-CM | POA: Diagnosis not present

## 2018-10-09 DIAGNOSIS — G479 Sleep disorder, unspecified: Secondary | ICD-10-CM | POA: Diagnosis present

## 2018-10-09 DIAGNOSIS — L83 Acanthosis nigricans: Secondary | ICD-10-CM | POA: Diagnosis present

## 2018-10-09 DIAGNOSIS — E871 Hypo-osmolality and hyponatremia: Secondary | ICD-10-CM | POA: Diagnosis present

## 2018-10-09 DIAGNOSIS — R1084 Generalized abdominal pain: Secondary | ICD-10-CM | POA: Diagnosis present

## 2018-10-09 DIAGNOSIS — F341 Dysthymic disorder: Secondary | ICD-10-CM | POA: Diagnosis present

## 2018-10-09 DIAGNOSIS — R079 Chest pain, unspecified: Secondary | ICD-10-CM | POA: Diagnosis not present

## 2018-10-09 DIAGNOSIS — E1310 Other specified diabetes mellitus with ketoacidosis without coma: Secondary | ICD-10-CM | POA: Diagnosis not present

## 2018-10-09 DIAGNOSIS — F459 Somatoform disorder, unspecified: Secondary | ICD-10-CM | POA: Diagnosis present

## 2018-10-09 DIAGNOSIS — I509 Heart failure, unspecified: Secondary | ICD-10-CM | POA: Diagnosis present

## 2018-10-09 DIAGNOSIS — Z915 Personal history of self-harm: Secondary | ICD-10-CM | POA: Diagnosis not present

## 2018-10-09 DIAGNOSIS — R9431 Abnormal electrocardiogram [ECG] [EKG]: Secondary | ICD-10-CM | POA: Diagnosis not present

## 2018-10-09 DIAGNOSIS — Z833 Family history of diabetes mellitus: Secondary | ICD-10-CM | POA: Diagnosis not present

## 2018-10-09 DIAGNOSIS — R112 Nausea with vomiting, unspecified: Secondary | ICD-10-CM | POA: Diagnosis not present

## 2018-10-09 DIAGNOSIS — F419 Anxiety disorder, unspecified: Secondary | ICD-10-CM | POA: Diagnosis present

## 2018-10-09 DIAGNOSIS — B3781 Candidal esophagitis: Secondary | ICD-10-CM | POA: Diagnosis present

## 2018-10-09 DIAGNOSIS — F445 Conversion disorder with seizures or convulsions: Secondary | ICD-10-CM | POA: Diagnosis present

## 2018-10-09 DIAGNOSIS — Z8249 Family history of ischemic heart disease and other diseases of the circulatory system: Secondary | ICD-10-CM | POA: Diagnosis not present

## 2018-10-09 DIAGNOSIS — R109 Unspecified abdominal pain: Secondary | ICD-10-CM | POA: Diagnosis not present

## 2018-10-09 DIAGNOSIS — E669 Obesity, unspecified: Secondary | ICD-10-CM | POA: Diagnosis present

## 2018-10-09 LAB — BASIC METABOLIC PANEL
Anion gap: 6 (ref 5–15)
BUN: 7 mg/dL (ref 6–20)
CO2: 21 mmol/L — ABNORMAL LOW (ref 22–32)
Calcium: 7.8 mg/dL — ABNORMAL LOW (ref 8.9–10.3)
Chloride: 110 mmol/L (ref 98–111)
Creatinine, Ser: 0.42 mg/dL — ABNORMAL LOW (ref 0.44–1.00)
GFR calc Af Amer: >60 mL/min (ref >60)
GFR calc non Af Amer: >60 mL/min (ref >60)
Glucose, Bld: 195 mg/dL — ABNORMAL HIGH (ref 70–99)
Potassium: 3.7 mmol/L (ref 3.5–5.1)
Sodium: 137 mmol/L (ref 135–145)

## 2018-10-09 LAB — HEMOGLOBIN AND HEMATOCRIT, BLOOD
HCT: 37.9 % (ref 36.0–46.0)
HCT: 37.6 % (ref 36.0–46.0)
Hemoglobin: 12 g/dL (ref 12.0–15.0)
Hemoglobin: 12.5 g/dL (ref 12.0–15.0)

## 2018-10-09 LAB — CBC
HCT: 33.6 % — ABNORMAL LOW (ref 36.0–46.0)
Hemoglobin: 11 g/dL — ABNORMAL LOW (ref 12.0–15.0)
MCH: 28.9 pg (ref 26.0–34.0)
MCHC: 32.7 g/dL (ref 30.0–36.0)
MCV: 88.4 fL (ref 80.0–100.0)
Platelets: 291 10*3/uL (ref 150–400)
RBC: 3.8 MIL/uL — ABNORMAL LOW (ref 3.87–5.11)
RDW: 13.5 % (ref 11.5–15.5)
WBC: 7.8 10*3/uL (ref 4.0–10.5)
nRBC: 0 % (ref 0.0–0.2)

## 2018-10-09 LAB — GLUCOSE, CAPILLARY
Glucose-Capillary: 214 mg/dL — ABNORMAL HIGH (ref 70–99)
Glucose-Capillary: 239 mg/dL — ABNORMAL HIGH (ref 70–99)

## 2018-10-09 LAB — CBG MONITORING, ED
Glucose-Capillary: 200 mg/dL — ABNORMAL HIGH (ref 70–99)
Glucose-Capillary: 193 mg/dL — ABNORMAL HIGH (ref 70–99)

## 2018-10-09 LAB — MAGNESIUM: Magnesium: 2.2 mg/dL (ref 1.7–2.4)

## 2018-10-09 LAB — TROPONIN I (HIGH SENSITIVITY): Troponin I (High Sensitivity): 2 ng/L (ref <18)

## 2018-10-09 MED ORDER — TRAMADOL HCL 50 MG PO TABS
50.0000 mg | ORAL_TABLET | Freq: Four times a day (QID) | ORAL | Status: DC | PRN
  Administered 2018-10-09 – 2018-10-15 (×9): 50 mg via ORAL
  Filled 2018-10-09 (×9): qty 1

## 2018-10-09 MED ORDER — PANTOPRAZOLE SODIUM 40 MG IV SOLR
40.0000 mg | Freq: Two times a day (BID) | INTRAVENOUS | Status: DC
  Administered 2018-10-09 – 2018-10-14 (×11): 40 mg via INTRAVENOUS
  Filled 2018-10-09 (×11): qty 40

## 2018-10-09 MED ORDER — INSULIN GLARGINE 100 UNIT/ML ~~LOC~~ SOLN
47.0000 [IU] | Freq: Every day | SUBCUTANEOUS | Status: DC
  Administered 2018-10-09: 47 [IU] via SUBCUTANEOUS
  Filled 2018-10-09 (×2): qty 0.47

## 2018-10-09 MED ORDER — METOCLOPRAMIDE HCL 5 MG/ML IJ SOLN
5.0000 mg | Freq: Four times a day (QID) | INTRAMUSCULAR | Status: DC
  Administered 2018-10-09 – 2018-10-10 (×4): 5 mg via INTRAVENOUS
  Filled 2018-10-09 (×4): qty 2

## 2018-10-09 MED ORDER — PROMETHAZINE HCL 25 MG/ML IJ SOLN
12.5000 mg | Freq: Four times a day (QID) | INTRAMUSCULAR | Status: DC | PRN
  Administered 2018-10-09 – 2018-10-10 (×2): 25 mg via INTRAVENOUS
  Filled 2018-10-09 (×2): qty 1

## 2018-10-09 NOTE — Progress Notes (Signed)
KAMA CAMMARANO [id number] Admission Data: 10/09/2018 2:27 PM Attending Provider: Jonnie Finner, DO  BBC:[id number], Vernia Buff, NP  [name] is a 18 y.o. female patient admitted from ED awake, alert  & orientated  X 3,  Full Code, VSS - Blood pressure 130/88, pulse (!) 110, temperature 98 F (36.7 C), temperature source Oral, resp. rate 18, height 5' 2.99" (1.6 m), weight 89.8 kg, last menstrual period 09/27/2018, SpO2 100 %., on RA. No c/o shortness of breath, no c/o chest pain, no distress noted. Tele # 19 placed and pt is currently running:sinus tachycardia.  Pt orientation to unit, room and routine. Information packet given to patient/family.  Admission INP armband ID verified with patient/family, and in place. SR up x 2, fall risk assessment complete with patient and family verbalizing understanding of risks associated with falls. Pt verbalizes an understanding of how to use the call bell and to call for help before getting out of bed.  Skin, clean-dry- intact without evidence of bruising, or skin tears.   No evidence of skin break down noted on exam.   Will continue to monitor and assist as needed.  Hiram Comber, RN 10/09/2018 2:27 PM

## 2018-10-09 NOTE — ED Notes (Signed)
PAGED TRIAD TO RN LACY--Lamiah Marmol

## 2018-10-09 NOTE — Progress Notes (Signed)
[facility]  PROGRESS NOTE    Nancy [name]  OTL:[id number] DOB: May 03, 2000 DOA: 10/08/2018 PCP: Claiborne Rigg, NP   Brief Narrative:   Nancy [name] is a 18 y.o. 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  Reportedly, the patient was found down on the floor by family and was unresponsive.  She does not recall how she ended up on the floor, but states that she has been having seizures.  However, appears to have been taken off Keppra because it was "too strong".  Complains of throwing up blood and her "stomach has been all messed up".  Episodes of emesis were noted to be bright red and coffee-ground discusses previous evaluations by [facility] where she notes being worked up and told that she did not have gastroparesis.  Associated symptoms include constipation. Patient still reports being able to pass flatus.  She had been taking her Lantus 52 units at night, but reports that she had not been taking the Humalog with meals because she was unable to keep any significant amount of food down.  10/09/2018: c/o hematemesis; ongoing N/V   Assessment & Plan:   Active Problems:   Obesity   Nausea and vomiting   Chronic abdominal pain   MDD (major depressive disorder), recurrent severe, without psychosis (HCC)   Conversion disorder with attacks or seizures, acute episode, with psychological stressor   DKA, type 1 (HCC)   Hypertensive urgency   DKA: Acute on chronic       - Patient presents with elevated blood glucose of 355 with CO2 21 and anion gap 16.       - fluids, novolog 15 units BID WM, lantus 42 units daily     - increase lantus to 47 units  Abdominal pain: Acute on chronic.       - CT scan of the abdomen and pelvis did not show any acute abnormalities and noted a  small cystic lesion in the right wall of the vagina thought to be a [employer] fast streamer ductal cysts unchanged.     - chronic pain; PRN pain control.  Nausea, vomiting/hematemesis:     - followed by [facility]     - has been on "every" nausea medicine per her report; and nothing works     - starting IV reglan     - continue fluids     - start IV protonix     - have consulted GI; appreciate assistance  Hypertensive urgency: Acute.       - Blood pressures elevated up to 161/106 on admission.       - Patient not on blood pressure medications at home.     - Labetalol IV as needed  Sinus tachycardia: Acute on chronic.       - Heart rates into the 120s on admission.       - Patient previously was on propranolol in the past.     - fluids, monitor  Anxiety/depression:      - Patient follows in outpatient setting with [facility].       - She was seen by PCP 3 days ago, and Lexapro was recommended to be discontinued by PCP and Seroquel was stopped.     - Continue Abilify, Prozac, Lamictal, and hydroxyzine as needed     - Continue outpatient follow-up with  [facility] on October 19  Psychogenic seizures:      - Evaluated and previously noted not to have epileptiform seizures.      - Seizure precautions  Obesity:      - BMI 35.08 kg/m     - diet, exercise counseled  DVT prophylaxis: lovenox Code Status: FULL Family Communication: None at bedside   Disposition Plan: TBD  Consultants:   GI   ROS:  Reports ab pain, nausea, vomiting, hematemesis. Denies CP, dyspnea, palpitations . Remainder 10-pt ROS is negative for all not previously mentioned.  Subjective: "I'm still throwing up."  Objective: Vitals:   10/09/18 1130 10/09/18 1145 10/09/18 1200 10/09/18 1215  BP: (!) 153/92 (!) 145/91 (!) 145/92 (!) 148/97  Pulse: (!) 103 (!) 105 (!) 109 (!) 104  Resp: (!) 25 20 (!) 24 (!) 23  Temp:      TempSrc:      SpO2: 100% 100% 100% 100%  Weight:      Height:        Intake/Output  Summary (Last 24 hours) at 10/09/2018 1306 Last data filed at 10/08/2018 2243 Gross per 24 hour  Intake 2098.98 ml  Output --  Net 2098.98 ml   Filed Weights   10/08/18 1052  Weight: 89.8 kg    Examination:  General: 18 y.o. female resting in bed in NAD Eyes: PERRL, normal sclera ENMT: Nares patent w/o discharge, orophaynx clear, dentition normal, ears w/o discharge/lesions/ulcers Cardiovascular: tachy, +S1, S2, no m/g/r, equal pulses throughout Respiratory: CTABL, no w/r/r, normal WOB GI: BS+, NDNT, no masses noted, no organomegaly noted, obese MSK: No e/c/c Skin: No rashes, bruises, ulcerations noted Neuro: A&O x 3, no focal deficits   Data Reviewed: I have personally reviewed following labs and imaging studies.  CBC: Recent Labs  Lab 10/05/18 1133 10/08/18 1104 10/09/18 0500  WBC 8.5 9.7 7.8  NEUTROABS 5.1  --   --   HGB 12.8 13.7 11.0*  HCT 40.8 40.4 33.6*  MCV 89 86.0 88.4  PLT 329 322 291   Basic Metabolic Panel: Recent Labs  Lab 10/05/18 1133 10/08/18 1104 10/08/18 1529 10/09/18 0500  NA 139 134* 134* 137  K 4.0 4.2 3.5 3.7  CL 99 97* 102 110  CO2 22 21* 20* 21*  GLUCOSE 223* 355* 282* 195*  BUN 13 11 10 7   CREATININE 0.61 0.84 0.61 0.42*  CALCIUM 9.1 9.5 8.6* 7.8*  MG  --   --   --  2.2   GFR: Estimated Creatinine Clearance: 119.4 mL/min (A) (by C-G formula based on SCr of 0.42 mg/dL (L)). Liver Function Tests: Recent Labs  Lab 10/05/18 1133  AST 15  ALT 19  ALKPHOS 88  BILITOT <0.2  PROT 7.2  ALBUMIN 4.7   Recent Labs  Lab 10/08/18 1104  LIPASE 20   No results for input(s): AMMONIA in the last 168 hours. Coagulation Profile: No results for input(s): INR, PROTIME in the last 168 hours. Cardiac Enzymes: No results for input(s): CKTOTAL, CKMB, CKMBINDEX, TROPONINI in the last 168 hours. BNP (last 3 results) No results for input(s): PROBNP in the last 8760 hours. HbA1C: No results for input(s): HGBA1C in the last 72  hours. CBG: Recent Labs  Lab 10/08/18 1819 10/08/18 2031 10/08/18 2237 10/09/18 0800 10/09/18 1238  GLUCAP 252* 155* 106* 200* 193*   Lipid Profile: No results for input(s): CHOL, HDL, LDLCALC, TRIG, CHOLHDL, LDLDIRECT in the last 72 hours. Thyroid Function Tests: No results for input(s): TSH, T4TOTAL, FREET4,  T3FREE, THYROIDAB in the last 72 hours. Anemia Panel: No results for input(s): VITAMINB12, FOLATE, FERRITIN, TIBC, IRON, RETICCTPCT in the last 72 hours. Sepsis Labs: No results for input(s): PROCALCITON, LATICACIDVEN in the last 168 hours.  No results found for this or any previous visit (from the past 240 hour(s)).    Radiology Studies: Ct Abdomen Pelvis W Contrast  Result Date: 10/08/2018 CLINICAL DATA:  Syncope in general abdominal pain today. The patient reports coffee ground emesis. EXAM: CT ABDOMEN AND PELVIS WITH CONTRAST TECHNIQUE: Multidetector CT imaging of the abdomen and pelvis was performed using the standard protocol following bolus administration of intravenous contrast. CONTRAST:  100 mL OMNIPAQUE IOHEXOL 300 MG/ML  SOLN COMPARISON:  CT abdomen and pelvis 07/25/2018. FINDINGS: Lower chest: Lung bases clear.  No pleural or pericardial effusion. Hepatobiliary: No focal liver abnormality is seen. No gallstones, gallbladder wall thickening, or biliary dilatation. Pancreas: Unremarkable. No pancreatic ductal dilatation or surrounding inflammatory changes. Spleen: Normal in size without focal abnormality. Adrenals/Urinary Tract: Adrenal glands are unremarkable. Kidneys are normal, without renal calculi, focal lesion, or hydronephrosis. Bladder is unremarkable. Stomach/Bowel: Adrenal glands are unremarkable. Kidneys are normal, without renal calculi, focal lesion, or hydronephrosis. Bladder is unremarkable. Vascular/Lymphatic: No significant vascular findings are present. No enlarged abdominal or pelvic lymph nodes. Reproductive: Uterus and bilateral adnexa are unremarkable.  A cystic lesion in the superior aspect of the vagina on the right measuring 1.8 cm AP x 1.3 cm transverse x 0.9 cm craniocaudal is unchanged. Other: None. Musculoskeletal: Negative. IMPRESSION: No acute abnormality abdomen or pelvis. No finding to explain the patient's symptoms. 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 Electronically Signed   By: Inge Rise M.D.   On: 10/08/2018 13:33     Scheduled Meds:  ARIPiprazole  20 mg Oral Daily   atorvastatin  20 mg Oral q1800   enoxaparin (LOVENOX) injection  40 mg Subcutaneous Q24H   FLUoxetine  40 mg Oral QHS   insulin aspart  0-9 Units Subcutaneous TID WC   insulin aspart  15 Units Subcutaneous BID WC   insulin glargine  42 Units Subcutaneous QHS   lamoTRIgine  25 mg Oral BID   metoCLOPramide (REGLAN) injection  5 mg Intravenous Q6H   pantoprazole (PROTONIX) IV  40 mg Intravenous BID   sodium chloride flush  3 mL Intravenous Q12H   topiramate  25 mg Oral BID   traZODone  300 mg Oral QHS   Continuous Infusions:  sodium chloride 100 mL/hr at 10/09/18 0453     LOS: 0 days    Time spent: 25 minutes spent in the coordination of care today.    Jonnie Finner, DO [facility] Pager [phone]  If 7PM-7AM, please contact night-coverage [email or web address] Password [email or web address] 10/09/2018, 1:06 PM

## 2018-10-09 NOTE — ED Notes (Signed)
ED TO INPATIENT HANDOFF REPORT  ED Nurse Name and Phone #: Susa RaringLisa, RN [phone]  S Name/Age/Gender Nancy [name] 18 y.o. female Room/Bed: [phone]  Code Status   Code Status: Full Code  Home/SNF/Other Home Patient oriented to: self, place, time and situation Is this baseline? Yes   Triage Complete: Triage complete  Chief Complaint syncope  Triage Note PER EMS: pt from home with c/o syncope, nausea and generalized lower abd pain. Pt states she has had blood in her vomit "like coffee grounds" and in her stool x 3 days. EMS states they found the pt responsive but laying on floor. She does not remember what happened, only remembers that she "passed out." A&OX4.  EMS adm 4mg  of IV zofran. BP: 177/111, HR-110 ST, CBG 342, O2-100% RA   Allergies Allergies  Allergen Reactions  . Ibuprofen Other (See Comments)    GI MD said to not take this anymore    Level of Care/Admitting Diagnosis ED Disposition    ED Disposition Condition Comment   Admit  [facility] [[id number]]  Level of Care: Progressive [102]  I expect the patient will be discharged within 24 hours: No (not a candidate for 5C-Observation unit)  Covid Evaluation: Asymptomatic Screening Protocol (No Symptoms)  Diagnosis: DKA, type 1 [facility](HCC) [[id number]][[id number]]  Admitting Physician: Clydie BraunSMITH, RONDELL A [[id number]]  Attending Physician: Clydie BraunSMITH, RONDELL A [[id number]]  PT Class (Do Not Modify): Observation [104]  PT Acc Code (Do Not Modify): Observation [10022]       B Medical/Surgery History Past Medical History:  Diagnosis Date  . Acanthosis nigricans   . Anxiety   . CHF (congestive heart failure) (HCC)   . Chronic lower back pain   . Depression   . DKA, type 1 (HCC) 09/13/2018  . Dyspepsia   . Obesity   . Ovarian cyst    pt is not aware of this hx (11/24/2017)  . Pre-diabetes   . 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 Premature baby   . Seizures (HCC)   . 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 ABDOMINAL HERNIA REPAIR     "I was a baby"  . HERNIA REPAIR    . TONSILLECTOMY AND ADENOIDECTOMY    . WISDOM TOOTH EXTRACTION  2017     A IV Location/Drains/Wounds Patient Lines/Drains/Airways Status   Active Line/Drains/Airways    Name:   Placement date:   Placement time:   Site:   Days:   Peripheral IV 10/08/18 Right Antecubital   10/08/18    -    Antecubital   1          Intake/Output Last 24 hours  Intake/Output Summary (Last 24 hours) at 10/09/2018 1301 Last data filed at 10/08/2018 2243 Gross per 24 hour  Intake 2098.98 ml  Output -  Net 2098.98 ml    Labs/Imaging Results for orders placed or performed during the hospital encounter of 10/08/18 (from the past 48 hour(s))  CBG monitoring, ED     Status: Abnormal   Collection Time: 10/08/18 11:01 AM  Result Value Ref Range   Glucose-Capillary 337 (H) 70 - 99 mg/dL   Comment 1 Notify RN    Comment 2 Document in Chart   Basic metabolic panel     Status: Abnormal   Collection Time: 10/08/18 11:04 AM  Result Value Ref Range   Sodium 134 (L) 135 - 145 mmol/L  Potassium 4.2 3.5 - 5.1 mmol/L   Chloride 97 (L) 98 - 111 mmol/L   CO2 21 (L) 22 - 32 mmol/L   Glucose, Bld 355 (H) 70 - 99 mg/dL   BUN 11 6 - 20 mg/dL   Creatinine, Ser 4.540.84 0.44 - 1.00 mg/dL   Calcium 9.5 8.9 - 09.810.3 mg/dL   GFR calc non Af Amer >60 >60 mL/min   GFR calc Af Amer >60 >60 mL/min   Anion gap 16 (H) 5 - 15    Comment: Performed at [facility], [address] [place], [place] [address]  CBC     Status: None   Collection Time: 10/08/18 11:04 AM  Result Value Ref Range   WBC 9.7 4.0 - 10.5 K/uL   RBC 4.70 3.87 - 5.11 MIL/uL   Hemoglobin 13.7 12.0 - 15.0 g/dL   HCT 78.240.4 95.636.0 - 21.346.0 %   MCV 86.0 80.0 - 100.0 fL   MCH 29.1 26.0 - 34.0 pg   MCHC 33.9 30.0 - 36.0 g/dL   RDW 08.613.1 57.811.5 - 46.915.5 %   Platelets 322 150 - 400 K/uL   nRBC 0.0 0.0 - 0.2 %    Comment: Performed at  [facility], [address] [place], [place] [address]  Lipase, blood     Status: None   Collection Time: 10/08/18 11:04 AM  Result Value Ref Range   Lipase 20 11 - 51 U/L    Comment: Performed at [facility], [address] [place], [place] [address]  I-Stat beta hCG blood, ED     Status: None   Collection Time: 10/08/18 11:09 AM  Result Value Ref Range   I-stat hCG, quantitative <5.0 <5 mIU/mL   Comment 3            Comment:   GEST. AGE      CONC.  (mIU/mL)   <=1 WEEK        5 - 50     2 WEEKS       50 - 500     3 WEEKS       100 - 10,000     4 WEEKS     1,000 - 30,000        FEMALE AND NON-PREGNANT FEMALE:     LESS THAN 5 mIU/mL   POC occult blood, ED Provider will collect     Status: None   Collection Time: 10/08/18 11:34 AM  Result Value Ref Range   Fecal Occult Bld NEGATIVE NEGATIVE  CBG monitoring, ED     Status: Abnormal   Collection Time: 10/08/18  1:58 PM  Result Value Ref Range   Glucose-Capillary 226 (H) 70 - 99 mg/dL  Urinalysis, Routine w reflex microscopic     Status: Abnormal   Collection Time: 10/08/18  2:26 PM  Result Value Ref Range   Color, Urine YELLOW YELLOW   APPearance CLEAR CLEAR   Specific Gravity, Urine >1.046 (H) 1.005 - 1.030   pH 7.0 5.0 - 8.0   Glucose, UA >=500 (A) NEGATIVE mg/dL   Hgb urine dipstick NEGATIVE NEGATIVE   Bilirubin Urine NEGATIVE NEGATIVE   Ketones, ur 20 (A) NEGATIVE mg/dL   Protein, ur NEGATIVE NEGATIVE mg/dL   Nitrite NEGATIVE NEGATIVE   Leukocytes,Ua NEGATIVE NEGATIVE   RBC / HPF 0-5 0 - 5 RBC/hpf   WBC, UA 0-5 0 - 5 WBC/hpf   Bacteria, UA RARE (A) NONE SEEN   Squamous Epithelial /  LPF 6-10 0 - 5    Comment: Performed at [facility], [address] [place], [place] [address]  Basic metabolic panel     Status: Abnormal   Collection Time: 10/08/18  3:29 PM  Result Value Ref Range   Sodium 134 (L) 135 - 145 mmol/L   Potassium 3.5 3.5 - 5.1 mmol/L   Chloride 102 98 - 111 mmol/L   CO2 20 (L) 22 -  32 mmol/L   Glucose, Bld 282 (H) 70 - 99 mg/dL   BUN 10 6 - 20 mg/dL   Creatinine, Ser 3.61 0.44 - 1.00 mg/dL   Calcium 8.6 (L) 8.9 - 10.3 mg/dL   GFR calc non Af Amer >60 >60 mL/min   GFR calc Af Amer >60 >60 mL/min   Anion gap 12 5 - 15    Comment: Performed at [facility], [address] [place], [place] [address]  POC CBG, ED     Status: Abnormal   Collection Time: 10/08/18  5:06 PM  Result Value Ref Range   Glucose-Capillary 248 (H) 70 - 99 mg/dL  CBG monitoring, ED     Status: Abnormal   Collection Time: 10/08/18  6:19 PM  Result Value Ref Range   Glucose-Capillary 252 (H) 70 - 99 mg/dL  CBG monitoring, ED     Status: Abnormal   Collection Time: 10/08/18  8:31 PM  Result Value Ref Range   Glucose-Capillary 155 (H) 70 - 99 mg/dL  CBG monitoring, ED     Status: Abnormal   Collection Time: 10/08/18 10:37 PM  Result Value Ref Range   Glucose-Capillary 106 (H) 70 - 99 mg/dL  CBC     Status: Abnormal   Collection Time: 10/09/18  5:00 AM  Result Value Ref Range   WBC 7.8 4.0 - 10.5 K/uL   RBC 3.80 (L) 3.87 - 5.11 MIL/uL   Hemoglobin 11.0 (L) 12.0 - 15.0 g/dL   HCT 40.0 (L) 86.7 - 61.9 %   MCV 88.4 80.0 - 100.0 fL   MCH 28.9 26.0 - 34.0 pg   MCHC 32.7 30.0 - 36.0 g/dL   RDW 50.9 32.6 - 71.2 %   Platelets 291 150 - 400 K/uL   nRBC 0.0 0.0 - 0.2 %    Comment: Performed at [facility], [address], [place], [place] [address]  Basic metabolic panel     Status: Abnormal   Collection Time: 10/09/18  5:00 AM  Result Value Ref Range   Sodium 137 135 - 145 mmol/L   Potassium 3.7 3.5 - 5.1 mmol/L   Chloride 110 98 - 111 mmol/L   CO2 21 (L) 22 - 32 mmol/L   Glucose, Bld 195 (H) 70 - 99 mg/dL   BUN 7 6 - 20 mg/dL   Creatinine, Ser 9.83 (L) 0.44 - 1.00 mg/dL   Calcium 7.8 (L) 8.9 - 10.3 mg/dL   GFR calc non Af Amer >60 >60 mL/min   GFR calc Af Amer >60 >60 mL/min   Anion gap 6 5 - 15    Comment: Performed at [facility], [address], [place],  [place] [address]  Magnesium     Status: None   Collection Time: 10/09/18  5:00 AM  Result Value Ref Range   Magnesium 2.2 1.7 - 2.4 mg/dL    Comment: Performed at [facility], [address], [place], [place] [address]  Troponin I (High Sensitivity)  Status: None   Collection Time: 10/09/18  5:00 AM  Result Value Ref Range   Troponin I (High Sensitivity) 2 <18 ng/L    Comment: (NOTE) Elevated high sensitivity troponin I (hsTnI) values and significant  changes across serial measurements may suggest ACS but many other  chronic and acute conditions are known to elevate hsTnI results.  Refer to the "Links" section for chest pain algorithms and additional  guidance. Performed at [facility], [address], [place], [place] [address]   CBG monitoring, ED     Status: Abnormal   Collection Time: 10/09/18  8:00 AM  Result Value Ref Range   Glucose-Capillary 200 (H) 70 - 99 mg/dL  CBG monitoring, ED     Status: Abnormal   Collection Time: 10/09/18 12:38 PM  Result Value Ref Range   Glucose-Capillary 193 (H) 70 - 99 mg/dL   Ct Abdomen Pelvis W Contrast  Result Date: 10/08/2018 CLINICAL DATA:  Syncope in general abdominal pain today. The patient reports coffee ground emesis. EXAM: CT ABDOMEN AND PELVIS WITH CONTRAST TECHNIQUE: Multidetector CT imaging of the abdomen and pelvis was performed using the standard protocol following bolus administration of intravenous contrast. CONTRAST:  100 mL OMNIPAQUE IOHEXOL 300 MG/ML  SOLN COMPARISON:  CT abdomen and pelvis 07/25/2018. FINDINGS: Lower chest: Lung bases clear.  No pleural or pericardial effusion. Hepatobiliary: No focal liver abnormality is seen. No gallstones, gallbladder wall thickening, or biliary dilatation. Pancreas: Unremarkable. No pancreatic ductal dilatation or surrounding inflammatory changes. Spleen: Normal in size without focal abnormality. Adrenals/Urinary Tract: Adrenal glands are unremarkable. Kidneys are normal, without  renal calculi, focal lesion, or hydronephrosis. Bladder is unremarkable. Stomach/Bowel: Adrenal glands are unremarkable. Kidneys are normal, without renal calculi, focal lesion, or hydronephrosis. Bladder is unremarkable. Vascular/Lymphatic: No significant vascular findings are present. No enlarged abdominal or pelvic lymph nodes. Reproductive: Uterus and bilateral adnexa are unremarkable. A cystic lesion in the superior aspect of the vagina on the right measuring 1.8 cm AP x 1.3 cm transverse x 0.9 cm craniocaudal is unchanged. Other: None. Musculoskeletal: Negative. IMPRESSION: No acute abnormality abdomen or pelvis. No finding to explain the patient's symptoms. 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 Electronically Signed   By: Drusilla Kanner M.D.   On: 10/08/2018 13:33    Pending Labs Unresulted Labs (From admission, onward)   None      Vitals/Pain Today's Vitals   10/09/18 0615 10/09/18 0630 10/09/18 0645 10/09/18 1122  BP: 131/81 126/82 114/68 129/74  Pulse: (!) 110 (!) 110 (!) 107 (!) 114  Resp: (!) 26 (!) 21 (!) 22 (!) 24  Temp:      TempSrc:      SpO2: 99% 100% 99% 100%  Weight:      Height:      PainSc:        Isolation Precautions No active isolations  Medications Medications  atorvastatin (LIPITOR) tablet 20 mg (20 mg Oral Given 10/08/18 1831)  FLUoxetine (PROZAC) capsule 40 mg (40 mg Oral Given 10/08/18 2242)  ARIPiprazole (ABILIFY) tablet 20 mg (20 mg Oral Given 10/09/18 1255)  hydrOXYzine (ATARAX/VISTARIL) tablet 25 mg (25 mg Oral Given 10/09/18 1258)  insulin aspart (novoLOG) injection 15 Units (15 Units Subcutaneous Not Given 10/09/18 1240)  enoxaparin (LOVENOX) injection 40 mg (40 mg Subcutaneous Not Given 10/08/18 1919)  sodium chloride flush (NS) 0.9 % injection 3 mL (3 mLs Intravenous Given 10/08/18 2308)  0.9 %  sodium chloride infusion ( Intravenous Rate/Dose Verify 10/09/18 0453)  acetaminophen (TYLENOL)  tablet 650 mg (650 mg Oral Given 10/09/18 0655)    Or  acetaminophen (TYLENOL) suppository 650 mg ( Rectal See Alternative 10/09/18 0655)  ondansetron (ZOFRAN) tablet 4 mg ( Oral See Alternative 10/09/18 0655)    Or  ondansetron (ZOFRAN) injection 4 mg (4 mg Intravenous Given 10/09/18 0655)  albuterol (PROVENTIL) (2.5 MG/3ML) 0.083% nebulizer solution 2.5 mg (has no administration in time range)  insulin aspart (novoLOG) injection 0-9 Units (2 Units Subcutaneous Given 10/09/18 1255)  labetalol (NORMODYNE) injection 5 mg (has no administration in time range)  lamoTRIgine (LAMICTAL) tablet 25 mg (25 mg Oral Given 10/09/18 1256)  topiramate (TOPAMAX) tablet 25 mg (25 mg Oral Given 10/08/18 2253)  traZODone (DESYREL) tablet 300 mg (300 mg Oral Given 10/08/18 2241)  insulin glargine (LANTUS) injection 42 Units (21 Units Subcutaneous Given 10/08/18 2255)  metoCLOPramide (REGLAN) injection 5 mg (5 mg Intravenous Given 10/09/18 1157)  traMADol (ULTRAM) tablet 50 mg (50 mg Oral Given 10/09/18 1157)  pantoprazole (PROTONIX) injection 40 mg (has no administration in time range)  sodium chloride flush (NS) 0.9 % injection 3 mL (3 mLs Intravenous Given 10/08/18 1108)  sodium chloride 0.9 % bolus 1,000 mL (0 mLs Intravenous Stopped 10/08/18 1336)  ondansetron (ZOFRAN) injection 4 mg (4 mg Intravenous Given 10/08/18 1212)  morphine 4 MG/ML injection 4 mg (4 mg Intravenous Given 10/08/18 1212)  insulin aspart (novoLOG) injection 6 Units (6 Units Intravenous Given 10/08/18 1210)  iohexol (OMNIPAQUE) 300 MG/ML solution 100 mL (100 mLs Intravenous Contrast Given 10/08/18 1246)  morphine 4 MG/ML injection 4 mg (4 mg Intravenous Given 10/08/18 1424)  promethazine (PHENERGAN) injection 12.5 mg (12.5 mg Intravenous Given 10/08/18 1424)  sodium chloride 0.9 % bolus 1,000 mL (0 mLs Intravenous Stopped 10/08/18 1835)  magnesium sulfate IVPB 1 g 100 mL (0 g Intravenous Stopped 10/08/18 2243)  potassium chloride SA (KLOR-CON) CR tablet 40  mEq (40 mEq Oral Given 10/08/18 2146)  promethazine (PHENERGAN) injection 12.5 mg (12.5 mg Intravenous Given 10/08/18 2258)    Mobility walks Low fall risk   Focused Assessments Pulmonary Assessment Handoff:  Lung sounds:   O2 Device: Room Air        R Recommendations: See Admitting Provider Note  Report given to:   Additional Notes:

## 2018-10-09 NOTE — Consult Note (Addendum)
Consultation  Referring Provider: [facility]/ Dr Ronaldo MiyamotoKyle DO Primary Care Physician:  Claiborne RiggFleming, Zelda W, NP Primary Gastroenterologist:  Dr.Armbruster  Reason for Consultation: Nausea, vomiting and hematemesis  HPI: Nancy [name] is a 18 y.o. female, known to Dr. Adela LankArmbruster and last seen in our office August 2019, who was admitted through the emergency room last evening after being found on the floor at home.  She complained of multiple episodes of nausea and vomiting over the past several days, inability to keep down p.o.'s and vomiting blood. She was found to be in DKA. Patient has history of type 1 diabetes, psychogenic seizures, chronic abdominal pain, anxiety and depression. She had undergone work-up in spring 2019 for complaints of recurrent nausea and vomiting.  She had EGD in April 2019 with finding of retained fluid in the stomach and otherwise negative exam.  Subsequent gastric emptying scan was within normal limits.   CT scan yesterday was negative with exception of small cyst in the right vaginal wall.  Review of labs shows hemoglobin from 10/01/2018 of 13.3, yesterday on admission hemoglobin 13.7 and today hemoglobin 11 hematocrit of 33.6. DKA resolving/CO2 21 chloride 110/creatinine 0.4  Patient tells me that she has been having nausea and vomiting on a daily basis over the past 2 weeks and has been seeing red blood with her emesis during that period of time.  She denies any heartburn or indigestion, no dysphagia or odynophagia.  She says she is on omeprazole and Protonix at home and has been taking them regularly until recently unable with vomiting.  Denies any aspirin or NSAID use.  She says her stools appeared dark over the past couple of weeks no obvious bright red blood. She complains of chronic generalized abdominal pain. She states she threw up some blood about 45 minutes ago, did not save.  On further discussion she tells me that she very recently had an endoscopy with Dr.  Adela LankArmbruster.  On review of her chart her last EGD as above was April 2019.  I reviewed care everywhere etc. and do not see any other endoscopies.  She says she sometimes gets [facility] at [facility] and is sure she had a fairly recent endoscopy. Patient did have recent behavioral health admission overnight 10/02/2018 with major depressive disorder with suicidal ideation.     Past Medical History:  Diagnosis Date  . Acanthosis nigricans   . Anxiety   . CHF (congestive heart failure) (HCC)   . Chronic lower back pain   . Depression   . DKA, type 1 (HCC) 09/13/2018  . Dyspepsia   . Obesity   . Ovarian cyst    pt is not aware of this hx (11/24/2017)  . Pre-diabetes   . 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 Premature baby   . Seizures (HCC)   . Type II diabetes mellitus (HCC)    insulin dependant    Past Surgical History:  Procedure Laterality Date  . ABDOMINAL HERNIA REPAIR     "I was a baby"  . HERNIA REPAIR    . TONSILLECTOMY AND ADENOIDECTOMY    . WISDOM TOOTH EXTRACTION  2017    Prior to Admission medications   Medication Sig Start Date End Date Taking? Authorizing Provider  ARIPiprazole (ABILIFY) 20 MG tablet Take 20 mg by mouth daily.   Yes [provider]  atorvastatin (LIPITOR) 20 MG tablet Take 1 tablet (20 mg total) by mouth daily at 6 PM. 10/05/18  Yes Storm FriskWright, Patrick E, MD  FLUoxetine (  PROZAC) 40 MG capsule Take 1 capsule (40 mg total) by mouth at bedtime. 10/05/18  Yes Storm Frisk, MD  glucagon 1 MG injection Use for Severe Hypoglycemia . Inject 1 mg intramuscularly if unresponsive, unable to swallow, unconscious and/or has seizure Patient taking differently: Inject 1 mg into the muscle once as needed (for severe hypoglycemia- if unresponsive, unable to swallow, unconscious, and/or has had seizure(s)).  07/20/13 12/19/25 Yes Romero Belling, MD  hydrOXYzine (ATARAX/VISTARIL) 25 MG tablet Take 1 tablet (25 mg total) by mouth 3 (three) times daily as needed for anxiety.  10/05/18  Yes Storm Frisk, MD  Insulin Degludec (TRESIBA) 100 UNIT/ML SOLN Inject 52 Units into the skin daily. 10/05/18  Yes Storm Frisk, MD  insulin lispro (HUMALOG) 100 UNIT/ML injection Inject 0.15 mLs (15 Units total) into the skin 2 (two) times daily with a meal. 10/05/18  Yes Storm Frisk, MD  lamoTRIgine (LAMICTAL) 25 MG tablet Take 1 tablet (25 mg total) by mouth 2 (two) times daily. 10/05/18  Yes Storm Frisk, MD  Multiple Vitamin (MULTIVITAMIN WITH MINERALS) TABS tablet Take 1 tablet by mouth daily. 09/18/18  Yes Clapacs, Jackquline Denmark, MD  promethazine (PHENERGAN) 25 MG tablet Take 1 tablet (25 mg total) by mouth every 8 (eight) hours as needed for nausea or vomiting. 10/05/18  Yes Storm Frisk, MD  topiramate (TOPAMAX) 25 MG tablet Take 1 tablet (25 mg total) by mouth 2 (two) times daily. 10/05/18  Yes Storm Frisk, MD  traZODone (DESYREL) 100 MG tablet Take 3 tablets (300 mg total) by mouth at bedtime. 10/05/18  Yes Hoy Register, MD    Current Facility-Administered Medications  Medication Dose Route Frequency Provider Last Rate Last Dose  . 0.9 %  sodium chloride infusion   Intravenous Continuous Madelyn Flavors A, MD 100 mL/hr at 10/09/18 0453    . acetaminophen (TYLENOL) tablet 650 mg  650 mg Oral Q6H PRN Madelyn Flavors A, MD   650 mg at 10/09/18 8119   Or  . acetaminophen (TYLENOL) suppository 650 mg  650 mg Rectal Q6H PRN Smith, Rondell A, MD      . albuterol (PROVENTIL) (2.5 MG/3ML) 0.083% nebulizer solution 2.5 mg  2.5 mg Nebulization Q2H PRN Smith, Rondell A, MD      . ARIPiprazole (ABILIFY) tablet 20 mg  20 mg Oral Daily Smith, Rondell A, MD   20 mg at 10/09/18 1255  . atorvastatin (LIPITOR) tablet 20 mg  20 mg Oral q1800 Madelyn Flavors A, MD   20 mg at 10/08/18 1831  . enoxaparin (LOVENOX) injection 40 mg  40 mg Subcutaneous Q24H Smith, Rondell A, MD      . FLUoxetine (PROZAC) capsule 40 mg  40 mg Oral QHS Smith, Rondell A, MD   40 mg at 10/08/18 2242  .  hydrOXYzine (ATARAX/VISTARIL) tablet 25 mg  25 mg Oral TID PRN Madelyn Flavors A, MD   25 mg at 10/09/18 1258  . insulin aspart (novoLOG) injection 0-9 Units  0-9 Units Subcutaneous TID WC Madelyn Flavors A, MD   2 Units at 10/09/18 1255  . insulin aspart (novoLOG) injection 15 Units  15 Units Subcutaneous BID WC Clydie Braun, MD   15 Units at 10/08/18 1833  . insulin glargine (LANTUS) injection 47 Units  47 Units Subcutaneous QHS Kyle, Tyrone A, DO      . labetalol (NORMODYNE) injection 5 mg  5 mg Intravenous Q2H PRN Clydie Braun, MD      .  lamoTRIgine (LAMICTAL) tablet 25 mg  25 mg Oral BID Fuller Plan A, MD   25 mg at 10/09/18 1256  . metoCLOPramide (REGLAN) injection 5 mg  5 mg Intravenous Q6H Kyle, Tyrone A, DO   5 mg at 10/09/18 1157  . ondansetron (ZOFRAN) tablet 4 mg  4 mg Oral Q6H PRN Fuller Plan A, MD       Or  . ondansetron (ZOFRAN) injection 4 mg  4 mg Intravenous Q6H PRN Fuller Plan A, MD   4 mg at 10/09/18 0655  . pantoprazole (PROTONIX) injection 40 mg  40 mg Intravenous BID Kyle, Tyrone A, DO      . sodium chloride flush (NS) 0.9 % injection 3 mL  3 mL Intravenous Q12H Smith, Rondell A, MD   3 mL at 10/08/18 2308  . topiramate (TOPAMAX) tablet 25 mg  25 mg Oral BID Fuller Plan A, MD   25 mg at 10/08/18 2253  . traMADol (ULTRAM) tablet 50 mg  50 mg Oral Q6H PRN Marylyn Ishihara, Tyrone A, DO   50 mg at 10/09/18 1157  . traZODone (DESYREL) tablet 300 mg  300 mg Oral QHS Smith, Rondell A, MD   300 mg at 10/08/18 2241   Current Outpatient Medications  Medication Sig Dispense Refill  . ARIPiprazole (ABILIFY) 20 MG tablet Take 20 mg by mouth daily.    Marland Kitchen atorvastatin (LIPITOR) 20 MG tablet Take 1 tablet (20 mg total) by mouth daily at 6 PM. 30 tablet 1  . FLUoxetine (PROZAC) 40 MG capsule Take 1 capsule (40 mg total) by mouth at bedtime. 30 capsule 1  . glucagon 1 MG injection Use for Severe Hypoglycemia . Inject 1 mg intramuscularly if unresponsive, unable to swallow, unconscious  and/or has seizure (Patient taking differently: Inject 1 mg into the muscle once as needed (for severe hypoglycemia- if unresponsive, unable to swallow, unconscious, and/or has had seizure(s)). ) 2 each 5  . hydrOXYzine (ATARAX/VISTARIL) 25 MG tablet Take 1 tablet (25 mg total) by mouth 3 (three) times daily as needed for anxiety. 60 tablet 1  . Insulin Degludec (TRESIBA) 100 UNIT/ML SOLN Inject 52 Units into the skin daily. 10 mL 3  . insulin lispro (HUMALOG) 100 UNIT/ML injection Inject 0.15 mLs (15 Units total) into the skin 2 (two) times daily with a meal. 10 mL 6  . lamoTRIgine (LAMICTAL) 25 MG tablet Take 1 tablet (25 mg total) by mouth 2 (two) times daily. 60 tablet 1  . Multiple Vitamin (MULTIVITAMIN WITH MINERALS) TABS tablet Take 1 tablet by mouth daily. 30 tablet 1  . promethazine (PHENERGAN) 25 MG tablet Take 1 tablet (25 mg total) by mouth every 8 (eight) hours as needed for nausea or vomiting. 20 tablet 0  . topiramate (TOPAMAX) 25 MG tablet Take 1 tablet (25 mg total) by mouth 2 (two) times daily. 60 tablet 1  . traZODone (DESYREL) 100 MG tablet Take 3 tablets (300 mg total) by mouth at bedtime. 90 tablet 2    Allergies as of 10/08/2018 - Review Complete 10/08/2018  Allergen Reaction Noted  . Ibuprofen Other (See Comments) 01/16/2018    Family History  Problem Relation Age of Onset  . Diabetes Mother   . Hypertension Mother   . Obesity Mother   . Asthma Mother   . Allergic rhinitis Mother   . Eczema Mother   . Diabetes Father   . Hypertension Father   . Obesity Father   . Hyperlipidemia Father   . Hypertension Paternal Aunt   .  Hypertension Maternal Grandfather   . Colon cancer Maternal Grandfather   . Diabetes Paternal Grandmother   . Obesity Paternal Grandmother   . Diabetes Paternal Grandfather   . Obesity Paternal Grandfather   . Angioedema Neg Hx   . Immunodeficiency Neg Hx   . Urticaria Neg Hx   . Stomach cancer Neg Hx   . Esophageal cancer Neg Hx     Social History   Social History Narrative   Lives with mom and mom's girlfriend.   No EtOH, tobacco, Drugs     Review of Systems: Pertinent positive and negative review of systems were noted in the above HPI section.  All other review of systems was otherwise negative.  Physical Exam: Vital signs in last 24 hours: Temp:  [98 F (36.7 C)] 98 F (36.7 C) (10/03 0517) Pulse Rate:  [103-139] 104 (10/03 1215) Resp:  [15-44] 23 (10/03 1215) BP: (83-153)/(52-97) 148/97 (10/03 1215) SpO2:  [94 %-100 %] 100 % (10/03 1215)   General:   Alert,  Well-developed, well-nourished, young African-American female pleasant and cooperative in NAD Head:  Normocephalic and atraumatic. Eyes:  Sclera clear, no icterus.   Conjunctiva pink. Ears:  Normal auditory acuity. Nose:  No deformity, discharge,  or lesions. Mouth:  No deformity or lesions.   Neck:  Supple; no masses or thyromegaly. Lungs:  Clear throughout to auscultation.   No wheezes, crackles, or rhonchi.  Heart:  Regular rate and rhythm; no murmurs, clicks, rubs,  or gallops. Abdomen:  Soft, mild rather diffuse tenderness across upper abdomen BS active,nonpalp mass or hsm.   Rectal Not done stool -was heme-negative yesterday Msk:  Symmetrical without gross deformities. . Pulses:  Normal pulses noted. Extremities:  Without clubbing or edema. Neurologic:  Alert and  oriented x4;  grossly normal neurologically. Skin:  Intact without significant lesions or rashes.. Psych:  Alert and cooperative. Normal mood and affect.  Intake/Output from previous day: 10/02 0701 - 10/03 0700 In: 2099 [IV Piggyback:2099] Out: -  Intake/Output this shift: No intake/output data recorded.  Lab Results: Recent Labs    10/08/18 1104 10/09/18 0500  WBC 9.7 7.8  HGB 13.7 11.0*  HCT 40.4 33.6*  PLT 322 291   BMET Recent Labs    10/08/18 1104 10/08/18 1529 10/09/18 0500  NA 134* 134* 137  K 4.2 3.5 3.7  CL 97* 102 110  CO2 21* 20* 21*  GLUCOSE  355* 282* 195*  BUN 11 10 7   CREATININE 0.84 0.61 0.42*  CALCIUM 9.5 8.6* 7.8*      IMPRESSION:  #581 18 year old African-American female, type I diabetic admitted with several day history of intractable nausea and vomiting with complaints of hematemesis and found to be in DKA, resolving  Apparently hematemesis was witnessed in the emergency room, she has had 2 g drop in hemoglobin since yesterday.  Interestingly stool was documented Hemoccult negative on admission yesterday, so drop in hemoglobin may be dilutional.  Rule out small Mallory-Weiss tear, erosive esophagitis or gastropathy  #2 previous work-up with endoscopy April 2019- and gastric emptying scan was normal so no previous diagnosis of gastroparesis  #3 chronic abdominal pain-Prior notes suggest component of some matization, rule out diabetic visceral neuropathy  #4 psychogenic seizures #5 major depression-recent behavioral health admission  Plan; clear liquids today, keep n.p.o. after midnight IV PPI twice daily Hold Lovenox Serial hemoglobins every 8 hours We will switch to Phenergan 12.5 to 25 mg every 6 hours as needed, patient says Zofran ineffective. Have scheduled  for EGD in a.m. with Dr. Leone Payor.  Procedure was discussed in detail with the patient including indications risks and benefits and she is agreeable to proceed.  If hemoglobin very stable in a.m., EGD can be cancelled.     Amy EsterwoodPA-C  10/09/2018, 1:25 PM    [facility] Attending   I have taken an interval history, reviewed the chart and examined the patient. I agree with the Advanced Practitioner's note, impression and recommendations.   I doubt EGD will be necessary. She certainly may have some tear or gastritis but unless I were to see real evidence of major GI bleeding would do supportive care. I am struggling to put together days-weeks hematemesis and heme neg stool. Possible but unlkely and she has h/o somatization d/o.  Hgb will fall with  hydration and in DKA.  Will see again tomorrow.  Iva Boop, MD, [facility] 10/09/2018 4:07 PM Pager [phone]

## 2018-10-09 NOTE — ED Notes (Signed)
Provider contacted for pt complaining of sharp chest pain radiating to L back. EKG and troponin obtained

## 2018-10-09 NOTE — ED Notes (Signed)
Lunch Tray Ordered @ 1056. 

## 2018-10-09 NOTE — ED Notes (Signed)
Family at bedside. 

## 2018-10-09 NOTE — ED Notes (Signed)
Ordered breakfast--Patterson Hollenbaugh 

## 2018-10-10 ENCOUNTER — Encounter (HOSPITAL_COMMUNITY)
Admission: EM | Disposition: A | Discharge status: Home / Self Care | Payer: Self-pay | Source: Home / Self Care | Attending: Internal Medicine

## 2018-10-10 DIAGNOSIS — F445 Conversion disorder with seizures or convulsions: Secondary | ICD-10-CM

## 2018-10-10 DIAGNOSIS — R1084 Generalized abdominal pain: Secondary | ICD-10-CM

## 2018-10-10 LAB — CBC WITH DIFFERENTIAL/PLATELET
Abs Immature Granulocytes: 0.02 10*3/uL (ref 0.00–0.07)
Basophils Absolute: 0 10*3/uL (ref 0.0–0.1)
Basophils Relative: 1 %
Eosinophils Absolute: 0 10*3/uL (ref 0.0–0.5)
Eosinophils Relative: 0 %
HCT: 37 % (ref 36.0–46.0)
Hemoglobin: 12.5 g/dL (ref 12.0–15.0)
Immature Granulocytes: 0 %
Lymphocytes Relative: 21 %
Lymphs Abs: 1.3 10*3/uL (ref 0.7–4.0)
MCH: 29.2 pg (ref 26.0–34.0)
MCHC: 33.8 g/dL (ref 30.0–36.0)
MCV: 86.4 fL (ref 80.0–100.0)
Monocytes Absolute: 0.6 10*3/uL (ref 0.1–1.0)
Monocytes Relative: 10 %
Neutro Abs: 4.4 10*3/uL (ref 1.7–7.7)
Neutrophils Relative %: 68 %
Platelets: 312 10*3/uL (ref 150–400)
RBC: 4.28 MIL/uL (ref 3.87–5.11)
RDW: 13.1 % (ref 11.5–15.5)
WBC: 6.4 10*3/uL (ref 4.0–10.5)
nRBC: 0 % (ref 0.0–0.2)

## 2018-10-10 LAB — RENAL FUNCTION PANEL
Albumin: 3.5 g/dL (ref 3.5–5.0)
Anion gap: 10 (ref 5–15)
BUN: <5 mg/dL — ABNORMAL LOW (ref 6–20)
CO2: 20 mmol/L — ABNORMAL LOW (ref 22–32)
Calcium: 8.3 mg/dL — ABNORMAL LOW (ref 8.9–10.3)
Chloride: 107 mmol/L (ref 98–111)
Creatinine, Ser: 0.4 mg/dL — ABNORMAL LOW (ref 0.44–1.00)
GFR calc Af Amer: >60 mL/min (ref >60)
GFR calc non Af Amer: >60 mL/min (ref >60)
Glucose, Bld: 184 mg/dL — ABNORMAL HIGH (ref 70–99)
Phosphorus: 1.9 mg/dL — ABNORMAL LOW (ref 2.5–4.6)
Potassium: 3.3 mmol/L — ABNORMAL LOW (ref 3.5–5.1)
Sodium: 137 mmol/L (ref 135–145)

## 2018-10-10 LAB — GLUCOSE, CAPILLARY
Glucose-Capillary: 196 mg/dL — ABNORMAL HIGH (ref 70–99)
Glucose-Capillary: 141 mg/dL — ABNORMAL HIGH (ref 70–99)
Glucose-Capillary: 198 mg/dL — ABNORMAL HIGH (ref 70–99)
Glucose-Capillary: 165 mg/dL — ABNORMAL HIGH (ref 70–99)

## 2018-10-10 LAB — MAGNESIUM: Magnesium: 2.1 mg/dL (ref 1.7–2.4)

## 2018-10-10 LAB — MRSA PCR SCREENING: MRSA by PCR: NEGATIVE

## 2018-10-10 LAB — HEMOGLOBIN AND HEMATOCRIT, BLOOD
HCT: 37.7 % (ref 36.0–46.0)
HCT: 37.6 % (ref 36.0–46.0)
Hemoglobin: 12.3 g/dL (ref 12.0–15.0)
Hemoglobin: 12.3 g/dL (ref 12.0–15.0)

## 2018-10-10 SURGERY — ESOPHAGOGASTRODUODENOSCOPY (EGD) WITH PROPOFOL
Anesthesia: Monitor Anesthesia Care

## 2018-10-10 MED ORDER — SODIUM PHOSPHATES 45 MMOLE/15ML IV SOLN
10.0000 mmol | Freq: Once | INTRAVENOUS | Status: AC
  Administered 2018-10-10: 10 mmol via INTRAVENOUS
  Filled 2018-10-10: qty 3.33

## 2018-10-10 MED ORDER — PROMETHAZINE HCL 25 MG/ML IJ SOLN
12.5000 mg | Freq: Four times a day (QID) | INTRAMUSCULAR | Status: DC
  Administered 2018-10-10 – 2018-10-11 (×6): 25 mg via INTRAVENOUS
  Administered 2018-10-12: 12.5 mg via INTRAVENOUS
  Administered 2018-10-12 (×2): 25 mg via INTRAVENOUS
  Administered 2018-10-12: 12.5 mg via INTRAVENOUS
  Administered 2018-10-13 (×2): 25 mg via INTRAVENOUS
  Administered 2018-10-13: 12.5 mg via INTRAVENOUS
  Administered 2018-10-14: 25 mg via INTRAVENOUS
  Administered 2018-10-14: 12.5 mg via INTRAVENOUS
  Filled 2018-10-10 (×16): qty 1

## 2018-10-10 MED ORDER — METOCLOPRAMIDE HCL 5 MG/ML IJ SOLN
10.0000 mg | Freq: Four times a day (QID) | INTRAMUSCULAR | Status: DC
  Administered 2018-10-10 – 2018-10-14 (×17): 10 mg via INTRAVENOUS
  Filled 2018-10-10 (×17): qty 2

## 2018-10-10 MED ORDER — AMLODIPINE BESYLATE 5 MG PO TABS
5.0000 mg | ORAL_TABLET | Freq: Every day | ORAL | Status: DC
  Administered 2018-10-10 – 2018-10-15 (×5): 5 mg via ORAL
  Filled 2018-10-10 (×5): qty 1

## 2018-10-10 MED ORDER — POTASSIUM CHLORIDE CRYS ER 20 MEQ PO TBCR
40.0000 meq | EXTENDED_RELEASE_TABLET | Freq: Two times a day (BID) | ORAL | Status: DC
  Administered 2018-10-10 – 2018-10-15 (×11): 40 meq via ORAL
  Filled 2018-10-10 (×11): qty 2

## 2018-10-10 MED ORDER — INSULIN GLARGINE 100 UNIT/ML ~~LOC~~ SOLN
50.0000 [IU] | Freq: Every day | SUBCUTANEOUS | Status: DC
  Administered 2018-10-10 – 2018-10-14 (×5): 50 [IU] via SUBCUTANEOUS
  Filled 2018-10-10 (×8): qty 0.5

## 2018-10-10 MED ORDER — MAGNESIUM HYDROXIDE 400 MG/5ML PO SUSP
15.0000 mL | Freq: Every day | ORAL | Status: DC | PRN
  Administered 2018-10-11: 15 mL via ORAL
  Filled 2018-10-10: qty 30

## 2018-10-10 MED ORDER — DOCUSATE SODIUM 100 MG PO CAPS
100.0000 mg | ORAL_CAPSULE | Freq: Two times a day (BID) | ORAL | Status: DC
  Administered 2018-10-10 – 2018-10-15 (×10): 100 mg via ORAL
  Filled 2018-10-10 (×9): qty 1

## 2018-10-10 NOTE — Progress Notes (Signed)
Patient has reported continuous nausea and some episodes of emesis throughout the morning. Emesis not witnessed by RN or NT. Patient ate a full liquid lunch tray and called out for "real food." Inquired about nausea and patient stated its still there but wants more to eat. Transitioned patient to carb modified diet. Will monitor.   Hiram Comber, RN 10/10/2018 12:46 PM

## 2018-10-10 NOTE — Progress Notes (Addendum)
Patient ID: Nancy [name], female   DOB: 2000-10-03, 18 y.o.   MRN: [id number]    Progress Note   Subjective  Day # 2 CC; DKA/nausea vomiting/hematemesis  WBC 6.4, hemoglobin up to 12.5 Potassium 3.3, phosphorus 1.9 Anion gap 10  Lying on stomach, tearful - says still very nauseated meds not working, some dry heaves-when asked about blood she said " a little "    Objective   Vital signs in last 24 hours: Temp:  [98 F (36.7 C)-99.2 F (37.3 C)] 98.5 F (36.9 C) (10/04 0814) Pulse Rate:  [96-114] 96 (10/04 0814) Resp:  [11-25] 19 (10/04 0814) BP: (129-153)/(74-100) 153/82 (10/04 0814) SpO2:  [100 %] 100 % (10/04 0814) Last BM Date: 10/07/18 General:    Young African-American female in NAD, tearful, Heart:  Regular rate and rhythm; no murmurs Lungs: Respirations even and unlabored, lungs CTA bilaterally Abdomen:  Soft, tender across upper abdomen and nondistended. Normal bowel sounds. Extremities:  Without edema. Neurologic:  Alert and oriented,  grossly normal neurologically. Psych:  Cooperative. Normal mood and affect.  Intake/Output from previous day: 10/03 0701 - 10/04 0700 In: 2152.4 [I.V.:2152.4] Out: -    Lab Results: Recent Labs    10/08/18 1104 10/09/18 0500 10/09/18 1422 10/09/18 2155 10/10/18 0754  WBC 9.7 7.8  --   --  6.4  HGB 13.7 11.0* 12.0 12.5 12.5  HCT 40.4 33.6* 37.9 37.6 37.0  PLT 322 291  --   --  312   BMET Recent Labs    10/08/18 1529 10/09/18 0500 10/10/18 0754  NA 134* 137 137  K 3.5 3.7 3.3*  CL 102 110 107  CO2 20* 21* 20*  GLUCOSE 282* 195* 184*  BUN 10 7 <5*  CREATININE 0.61 0.42* 0.40*  CALCIUM 8.6* 7.8* 8.3*   LFT Recent Labs    10/10/18 0754  ALBUMIN 3.5   PT/INR No results for input(s): LABPROT, INR in the last 72 hours.  Studies/Results: Ct Abdomen Pelvis W Contrast  Result Date: 10/08/2018 CLINICAL DATA:  Syncope in general abdominal pain today. The patient reports coffee ground emesis. EXAM: CT  ABDOMEN AND PELVIS WITH CONTRAST TECHNIQUE: Multidetector CT imaging of the abdomen and pelvis was performed using the standard protocol following bolus administration of intravenous contrast. CONTRAST:  100 mL OMNIPAQUE IOHEXOL 300 MG/ML  SOLN COMPARISON:  CT abdomen and pelvis 07/25/2018. FINDINGS: Lower chest: Lung bases clear.  No pleural or pericardial effusion. Hepatobiliary: No focal liver abnormality is seen. No gallstones, gallbladder wall thickening, or biliary dilatation. Pancreas: Unremarkable. No pancreatic ductal dilatation or surrounding inflammatory changes. Spleen: Normal in size without focal abnormality. Adrenals/Urinary Tract: Adrenal glands are unremarkable. Kidneys are normal, without renal calculi, focal lesion, or hydronephrosis. Bladder is unremarkable. Stomach/Bowel: Adrenal glands are unremarkable. Kidneys are normal, without renal calculi, focal lesion, or hydronephrosis. Bladder is unremarkable. Vascular/Lymphatic: No significant vascular findings are present. No enlarged abdominal or pelvic lymph nodes. Reproductive: Uterus and bilateral adnexa are unremarkable. A cystic lesion in the superior aspect of the vagina on the right measuring 1.8 cm AP x 1.3 cm transverse x 0.9 cm craniocaudal is unchanged. Other: None. Musculoskeletal: Negative. IMPRESSION: No acute abnormality abdomen or pelvis. No finding to explain the patient's symptoms. 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 Electronically Signed   By: Drusilla Kanner M.D.   On: 10/08/2018 13:33       Assessment / Plan:    #  26 18 year old African-American female admitted with intractable nausea vomiting and complaints of hematemesis in setting of DKA  Patient remains very nauseated, no evidence of any significant GI bleeding. Hemoglobin actually up over the past 24 hours.  She likely has acute gastroparesis in setting of the DKA. 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    LOS: 1 day   Amy EsterwoodPA-C  10/10/2018, 9:19 AM   Agree with Ms. Genia Harold assessment and plan with following additions:  Verification of her history has been difficult. I believe there may be other issues at play as far as to the validity of her complaints. She does not have a clear diagnosis of gastroparesis and in fact had a NL gastric emptying study. 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  Psych consult could be helpful   Gatha Mayer, MD, [facility]

## 2018-10-10 NOTE — Progress Notes (Signed)
    Hgb Nl  No EGD  Gatha Mayer, MD, [facility] 10/10/2018 6:04 AM Pager [phone]

## 2018-10-10 NOTE — TOC Initial Note (Signed)
Transition of Care [facility]) - Initial/Assessment Note    Patient Details  Name: Nancy [name] MRN: [id number] Date of Birth: [birth year]  [facility] CM/SW Contact:    Carles Collet, RN Phone Number: 10/10/2018, 11:19 AM  Clinical Narrative:          Patient from home. 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  TOC will continue to follow.             Barriers to Discharge: Continued Medical Work up   Patient Goals and CMS Choice        Expected Discharge Plan and Services                                                Prior Living Arrangements/Services                       Activities of Daily Living      Permission Sought/Granted                  Emotional Assessment              Admission diagnosis:  Generalized abdominal pain [R10.84] Diabetic ketoacidosis without coma associated with other specified diabetes mellitus ([place]) [E13.10] Patient Active Problem List   Diagnosis Date Noted  . DKA, type 1 ([place]) 10/08/2018  . Hypertensive urgency 10/08/2018  . Conversion disorder with attacks or seizures, acute episode, with psychological stressor 09/16/2018  . Suicidal ideations 09/13/2018  . MDD (major depressive disorder), recurrent severe, without psychosis ([place])   . Syncope 01/30/2018  . Orthostatic hypotension 01/24/2018  . Chronic abdominal pain 12/24/2017  . Chest pain 12/19/2017  . Nausea and vomiting 08/21/2017  . Non compliance with medical treatment 01/27/2012  . Adjustment disorder 09/16/2011  . DM (diabetes mellitus), type 1, uncontrolled ([place]) 09/15/2011  . Acanthosis nigricans   . Goiter   . Obesity 06/14/2010  . Hypertension  06/14/2010   PCP:  [name], NP Pharmacy:   [facility], [address] [place] [address] Phone: [phone] Fax: [phone]     Social Determinants of Health (SDOH) Interventions    Readmission Risk Interventions Readmission Risk Prevention Plan 08/05/2018 06/15/2018  Transportation Screening Complete Complete  PCP or Specialist Appt within 3-5 Days Complete -  Social Work Consult for [place] Planning/Counseling Not Complete -  SW consult not completed comments no needs -  Palliative Care Screening Not Applicable -  Medication Review Press photographer) Referral to Pharmacy Complete  PCP or Specialist appointment within 3-5 days of discharge - Complete  HRI or [place] - Complete  SW Recovery Care/Counseling Consult - Complete  [place] - Not Applicable  Some recent data might be hidden

## 2018-10-10 NOTE — Progress Notes (Signed)
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 Reportedly, the patient was found down on the floor by family and was unresponsive. She does not recall how she ended up on the floor, but states that she has been having seizures. However, appears to have been taken off Keppra because it was "too strong". Complains of throwing up blood and her "stomach has been all messed up". Episodes of emesis werenoted to be bright red and coffee-ground discusses previous evaluations by [facility] where she notes being worked up and told that she did not have gastroparesis. Associated symptoms include constipation. Patient still reports being able to pass flatus.She had been taking her Lantus 52 units at night, but reports that she had not been taking the Humalog with meals because she was unable to keep any significant amount of food down.  10/3: c/o hematemesis; ongoing N/V 10/4: c/o N/V ON, able to keep down some chicken broth she says; ab pain not mentioned this AM; reports constipation   Assessment & Plan:   Active Problems:   Obesity   Nausea and vomiting   Chronic abdominal pain   MDD (major depressive disorder), recurrent severe, without psychosis (HCC)   Conversion disorder with attacks or seizures, acute episode, with psychological stressor   DKA, type 1 (HCC)   Hypertensive urgency   DKA: Acute on chronic       - Patient presents with elevated blood glucose of 355 with CO2 21 and anion gap 16.       - fluids, novolog 15 units BID WM, lantus 47 units daily     - increase lantus to 50.   Abdominal pain:  Acute on chronic.       - CT scan of the abdomen and pelvis did not show any acute abnormalities and noted a small cystic lesion in the right wall of the vagina thought to be a [employer] fast streamer ductal cysts unchanged.     - chronic pain; PRN pain control.     - stable  Nausea, vomiting/hematemesis:     - followed by [facility]     - has been on "every" nausea medicine per her report; and nothing works     - GI onboard; appreciate assistance     - IV reglan, protonix     - Hgb is stable  Hypertensive urgency: Acute.       - Blood pressures elevated up to 161/106 on admission.       - Patient not on blood pressure medications at home.     - Labetalol IV as needed     - start amlodipine 5 mg PO  Sinus tachycardia: Acute on chronic.       - Heart rates into the 120s on admission.       - Patient previously was on propranolol in the past.     - fluids, monitor     - improving  Anxiety/depression:      - Patient follows in outpatient setting with [facility].       - She was seen by PCP 3 days ago, and Lexapro was recommended to be discontinued by PCP and Seroquel was stopped.     -  Continue Abilify, Prozac, Lamictal, and hydroxyzine as needed     - Continue outpatient follow-up with [facility] on October 19  Psychogenic seizures:      - Evaluated and previously noted not to have epileptiform seizures.      - Seizure precautions  Obesity:      - BMI 35.08 kg/m     - diet, exercise counseled  Constipation     - colace, PRN MoM  DVT prophylaxis: lovenox Code Status: FULL  Disposition Plan: TBD  Consultants:   GI   ROS:  Reports N, V. Denies dyspnea, CP. Remainder 10-pt ROS is negative for all not previously mentioned.  Subjective: "I haven't pooped in 3 days."  Objective: Vitals:   10/09/18 2107 10/10/18 0313 10/10/18 0540 10/10/18 0814  BP: 138/90  (!) 149/100 (!) 153/82  Pulse: (!) 106  (!) 103 96  Resp: 18 11 18 19   Temp: 99.2 F (37.3 C)  98.5 F (36.9 C) 98.5  F (36.9 C)  TempSrc: Oral  Oral Oral  SpO2: 100%  100% 100%  Weight:      Height:        Intake/Output Summary (Last 24 hours) at 10/10/2018 1049 Last data filed at 10/09/2018 1651 Gross per 24 hour  Intake 2152.38 ml  Output -  Net 2152.38 ml   Filed Weights   10/08/18 1052  Weight: 89.8 kg    Examination:  General: 18 y.o. female resting in bed in NAD Eyes: PERRL, normal sclera ENMT: Nares patent w/o discharge, orophaynx clear, dentition normal, ears w/o discharge/lesions/ulcers Cardiovascular: tachy, +S1, S2, no m/g/r, equal pulses throughout Respiratory: CTABL, no w/r/r, normal WOB GI: BS+, NDNT, no masses noted, no organomegaly noted, obese MSK: No e/c/c Neuro: A&O x 3, no focal deficits   Data Reviewed: I have personally reviewed following labs and imaging studies.  CBC: Recent Labs  Lab 10/05/18 1133 10/08/18 1104 10/09/18 0500 10/09/18 1422 10/09/18 2155 10/10/18 0754  WBC 8.5 9.7 7.8  --   --  6.4  NEUTROABS 5.1  --   --   --   --  4.4  HGB 12.8 13.7 11.0* 12.0 12.5 12.5  HCT 40.8 40.4 33.6* 37.9 37.6 37.0  MCV 89 86.0 88.4  --   --  86.4  PLT 329 322 291  --   --  312   Basic Metabolic Panel: Recent Labs  Lab 10/05/18 1133 10/08/18 1104 10/08/18 1529 10/09/18 0500 10/10/18 0754  NA 139 134* 134* 137 137  K 4.0 4.2 3.5 3.7 3.3*  CL 99 97* 102 110 107  CO2 22 21* 20* 21* 20*  GLUCOSE 223* 355* 282* 195* 184*  BUN 13 11 10 7  <5*  CREATININE 0.61 0.84 0.61 0.42* 0.40*  CALCIUM 9.1 9.5 8.6* 7.8* 8.3*  MG  --   --   --  2.2 2.1  PHOS  --   --   --   --  1.9*   GFR: Estimated Creatinine Clearance: 119.4 mL/min (A) (by C-G formula based on SCr of 0.4 mg/dL (L)). Liver Function Tests: Recent Labs  Lab 10/05/18 1133 10/10/18 0754  AST 15  --   ALT 19  --   ALKPHOS 88  --   BILITOT <0.2  --   PROT 7.2  --   ALBUMIN 4.7 3.5   Recent Labs  Lab 10/08/18 1104  LIPASE 20   No results for input(s): AMMONIA in the last 168 hours.  Coagulation Profile: No results for input(s):  INR, PROTIME in the last 168 hours. Cardiac Enzymes: No results for input(s): CKTOTAL, CKMB, CKMBINDEX, TROPONINI in the last 168 hours. BNP (last 3 results) No results for input(s): PROBNP in the last 8760 hours. HbA1C: No results for input(s): HGBA1C in the last 72 hours. CBG: Recent Labs  Lab 10/09/18 0800 10/09/18 1238 10/09/18 1642 10/09/18 2104 10/10/18 0812  GLUCAP 200* 193* 214* 239* 196*   Lipid Profile: No results for input(s): CHOL, HDL, LDLCALC, TRIG, CHOLHDL, LDLDIRECT in the last 72 hours. Thyroid Function Tests: No results for input(s): TSH, T4TOTAL, FREET4, T3FREE, THYROIDAB in the last 72 hours. Anemia Panel: No results for input(s): VITAMINB12, FOLATE, FERRITIN, TIBC, IRON, RETICCTPCT in the last 72 hours. Sepsis Labs: No results for input(s): PROCALCITON, LATICACIDVEN in the last 168 hours.  Recent Results (from the past 240 hour(s))  MRSA PCR Screening     Status: None   Collection Time: 10/10/18  8:51 AM   Specimen: Nasal Mucosa; Nasopharyngeal  Result Value Ref Range Status   MRSA by PCR NEGATIVE NEGATIVE Final    Comment:        The GeneXpert MRSA Assay (FDA approved for NASAL specimens only), is one component of a comprehensive MRSA colonization surveillance program. It is not intended to diagnose MRSA infection nor to guide or monitor treatment for MRSA infections. Performed at [facility], [place] [address] [place], [place] [address]       Radiology Studies: Ct Abdomen Pelvis W Contrast  Result Date: 10/08/2018 CLINICAL DATA:  Syncope in general abdominal pain today. The patient reports coffee ground emesis. EXAM: CT ABDOMEN AND PELVIS WITH CONTRAST TECHNIQUE: Multidetector CT imaging of the abdomen and pelvis was performed using the standard protocol following bolus administration of intravenous contrast. CONTRAST:  100 mL OMNIPAQUE IOHEXOL 300 MG/ML  SOLN COMPARISON:  CT abdomen and  pelvis 07/25/2018. FINDINGS: Lower chest: Lung bases clear.  No pleural or pericardial effusion. Hepatobiliary: No focal liver abnormality is seen. No gallstones, gallbladder wall thickening, or biliary dilatation. Pancreas: Unremarkable. No pancreatic ductal dilatation or surrounding inflammatory changes. Spleen: Normal in size without focal abnormality. Adrenals/Urinary Tract: Adrenal glands are unremarkable. Kidneys are normal, without renal calculi, focal lesion, or hydronephrosis. Bladder is unremarkable. Stomach/Bowel: Adrenal glands are unremarkable. Kidneys are normal, without renal calculi, focal lesion, or hydronephrosis. Bladder is unremarkable. Vascular/Lymphatic: No significant vascular findings are present. No enlarged abdominal or pelvic lymph nodes. Reproductive: Uterus and bilateral adnexa are unremarkable. A cystic lesion in the superior aspect of the vagina on the right measuring 1.8 cm AP x 1.3 cm transverse x 0.9 cm craniocaudal is unchanged. Other: None. Musculoskeletal: Negative. IMPRESSION: No acute abnormality abdomen or pelvis. No finding to explain the patient's symptoms. 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 Electronically Signed   By: Inge Rise M.D.   On: 10/08/2018 13:33     Scheduled Meds: . ARIPiprazole  20 mg Oral Daily  . atorvastatin  20 mg Oral q1800  . docusate sodium  100 mg Oral BID  . FLUoxetine  40 mg Oral QHS  . insulin aspart  0-9 Units Subcutaneous TID WC  . insulin aspart  15 Units Subcutaneous BID WC  . insulin glargine  47 Units Subcutaneous QHS  . lamoTRIgine  25 mg Oral BID  . metoCLOPramide (REGLAN) injection  10 mg Intravenous Q6H  . pantoprazole (PROTONIX) IV  40 mg Intravenous BID  . potassium chloride  40  mEq Oral BID  . promethazine  12.5-25 mg Intravenous Q6H  . sodium chloride flush  3 mL Intravenous Q12H  . topiramate  25 mg Oral BID  . traZODone  300 mg Oral QHS    Continuous Infusions: . sodium chloride 100 mL/hr at 10/10/18 0842  . sodium phosphate  Dextrose 5% IVPB       LOS: 1 day    Time spent: 25 minutes spent in the coordination of care today.    Teddy Spikeyrone A Sheala Dosh, DO [facility] Pager [phone]  If 7PM-7AM, please contact night-coverage [email or web address] Password [email or web address] 10/10/2018, 10:49 AM

## 2018-10-11 ENCOUNTER — Inpatient Hospital Stay (HOSPITAL_COMMUNITY): Payer: Medicaid Other

## 2018-10-11 DIAGNOSIS — R109 Unspecified abdominal pain: Secondary | ICD-10-CM

## 2018-10-11 DIAGNOSIS — G8929 Other chronic pain: Secondary | ICD-10-CM

## 2018-10-11 DIAGNOSIS — R55 Syncope and collapse: Secondary | ICD-10-CM

## 2018-10-11 LAB — CBC WITH DIFFERENTIAL/PLATELET
Abs Immature Granulocytes: 0.03 10*3/uL (ref 0.00–0.07)
Basophils Absolute: 0 10*3/uL (ref 0.0–0.1)
Basophils Relative: 0 %
Eosinophils Absolute: 0 10*3/uL (ref 0.0–0.5)
Eosinophils Relative: 0 %
HCT: 37.8 % (ref 36.0–46.0)
Hemoglobin: 12.8 g/dL (ref 12.0–15.0)
Immature Granulocytes: 0 %
Lymphocytes Relative: 22 %
Lymphs Abs: 1.7 10*3/uL (ref 0.7–4.0)
MCH: 28.8 pg (ref 26.0–34.0)
MCHC: 33.9 g/dL (ref 30.0–36.0)
MCV: 85.1 fL (ref 80.0–100.0)
Monocytes Absolute: 0.9 10*3/uL (ref 0.1–1.0)
Monocytes Relative: 12 %
Neutro Abs: 5 10*3/uL (ref 1.7–7.7)
Neutrophils Relative %: 66 %
Platelets: 331 10*3/uL (ref 150–400)
RBC: 4.44 MIL/uL (ref 3.87–5.11)
RDW: 13.2 % (ref 11.5–15.5)
WBC: 7.6 10*3/uL (ref 4.0–10.5)
nRBC: 0 % (ref 0.0–0.2)

## 2018-10-11 LAB — RENAL FUNCTION PANEL
Albumin: 3.7 g/dL (ref 3.5–5.0)
Anion gap: 9 (ref 5–15)
BUN: <5 mg/dL — ABNORMAL LOW (ref 6–20)
CO2: 22 mmol/L (ref 22–32)
Calcium: 8.7 mg/dL — ABNORMAL LOW (ref 8.9–10.3)
Chloride: 106 mmol/L (ref 98–111)
Creatinine, Ser: 0.46 mg/dL (ref 0.44–1.00)
GFR calc Af Amer: >60 mL/min (ref >60)
GFR calc non Af Amer: >60 mL/min (ref >60)
Glucose, Bld: 169 mg/dL — ABNORMAL HIGH (ref 70–99)
Phosphorus: 2.4 mg/dL — ABNORMAL LOW (ref 2.5–4.6)
Potassium: 3.5 mmol/L (ref 3.5–5.1)
Sodium: 137 mmol/L (ref 135–145)

## 2018-10-11 LAB — GLUCOSE, CAPILLARY
Glucose-Capillary: 162 mg/dL — ABNORMAL HIGH (ref 70–99)
Glucose-Capillary: 161 mg/dL — ABNORMAL HIGH (ref 70–99)
Glucose-Capillary: 158 mg/dL — ABNORMAL HIGH (ref 70–99)
Glucose-Capillary: 117 mg/dL — ABNORMAL HIGH (ref 70–99)

## 2018-10-11 LAB — T4, FREE: Free T4: 1.07 ng/dL (ref 0.61–1.12)

## 2018-10-11 LAB — ECHOCARDIOGRAM COMPLETE
Height: 62.992 in
Weight: 3168 oz

## 2018-10-11 LAB — LIPASE, BLOOD: Lipase: 22 U/L (ref 11–51)

## 2018-10-11 LAB — TSH: TSH: 0.199 u[IU]/mL — ABNORMAL LOW (ref 0.350–4.500)

## 2018-10-11 LAB — SARS CORONAVIRUS 2 (TAT 6-24 HRS): SARS Coronavirus 2: NEGATIVE

## 2018-10-11 LAB — MAGNESIUM: Magnesium: 2 mg/dL (ref 1.7–2.4)

## 2018-10-11 MED ORDER — ARIPIPRAZOLE 5 MG PO TABS
5.0000 mg | ORAL_TABLET | Freq: Every day | ORAL | Status: DC
  Administered 2018-10-12 – 2018-10-15 (×4): 5 mg via ORAL
  Filled 2018-10-11 (×4): qty 1

## 2018-10-11 MED ORDER — QUETIAPINE FUMARATE 25 MG PO TABS
50.0000 mg | ORAL_TABLET | Freq: Every day | ORAL | Status: DC
  Administered 2018-10-11 – 2018-10-14 (×4): 50 mg via ORAL
  Filled 2018-10-11 (×4): qty 2

## 2018-10-11 MED ORDER — BISACODYL 10 MG RE SUPP
10.0000 mg | Freq: Every day | RECTAL | Status: AC
  Administered 2018-10-11 – 2018-10-12 (×2): 10 mg via RECTAL
  Filled 2018-10-11 (×2): qty 1

## 2018-10-11 NOTE — Progress Notes (Signed)
   10/11/18 0755  What Happened  Was fall witnessed? No  Was patient injured? No  Patient found on floor  Found by Staff-comment (Ted [employer] Raven, NT)  Stated prior activity ambulating-unassisted  Follow Up  MD notified Cherylann Ratel, MD  Time MD notified [phone]  Family notified No - patient refusal  Additional tests Yes-comment (labs to be ordered)  Progress note created (see row info) Yes  Adult Fall Risk Assessment  Risk Factor Category (scoring not indicated) Fall has occurred during this admission (document High fall risk)  Patient Fall Risk Level High fall risk  Adult Fall Risk Interventions  Required Bundle Interventions *See Row Information* High fall risk - low, moderate, and high requirements implemented  Additional Interventions Use of appropriate toileting equipment (bedpan, BSC, etc.)  Screening for Fall Injury Risk (To be completed on HIGH fall risk patients) - Assessing Need for Low Bed  Risk For Fall Injury- Low Bed Criteria Previous fall this admission  Will Implement Low Bed and Floor Mats Yes  Screening for Fall Injury Risk (To be completed on HIGH fall risk patients who do not meet crieteria for Low Bed) - Assessing Need for Floor Mats Only  Risk For Fall Injury- Criteria for Floor Mats None identified - No additional interventions needed  Vitals  Temp 99.1 F (37.3 C)  Temp Source Oral  BP 122/73  MAP (mmHg) 88  BP Location Right Arm  BP Method Automatic  Patient Position (if appropriate) Lying  Pulse Rate (!) 101  ECG Heart Rate 100  Resp (!) 25  Oxygen Therapy  SpO2 100 %  O2 Device Room Air  Pain Assessment  Pain Scale 0-10  Pain Score 0  Neurological  Neuro (WDL) WDL  Glasgow Coma Scale  Eye Opening 4  Best Verbal Response (NON-intubated) 5  Best Motor Response 6  Glasgow Coma Scale Score 15  Musculoskeletal  Musculoskeletal (WDL) WDL  Integumentary  Integumentary (WDL) WDL

## 2018-10-11 NOTE — Progress Notes (Signed)
   10/11/18 0755  Vitals  Temp 99.1 F (37.3 C)  Temp Source Oral  BP 122/73  MAP (mmHg) 88  Pulse Rate (!) 101  ECG Heart Rate 100  Resp (!) 25  Oxygen Therapy  SpO2 100 %  MEWS Score  MEWS RR 1  MEWS Pulse 0  MEWS Systolic 0  MEWS LOC 0  MEWS Temp 0  MEWS Score 1  MEWS Score Color Green   During shift report, patient called and said she felt like she was going to "pass out." upon assessment, patient lying face first on the floor. Sinus tach on the monitor in 100's. Patient arousable and states she doesn't recall how she got on the floor. Vital signs above. Dr. Marylyn Ishihara paged to make aware. Blood sugar 162. Patient assisted back to bed. C/o dizziness. Will continue to monitor.  Hiram Comber, RN 10/11/2018 7:59 AM

## 2018-10-11 NOTE — Consult Note (Signed)
[facility] Face-to-Face Psychiatry Consult   Reason for Consult: GI symptoms unexplained by medical assesments Referring Physician:  Dr. Cherylann Ratel Patient Identification: Nancy [name] MRN:  [id number] Principal Diagnosis: MDD (major depressive disorder), recurrent severe, without psychosis ([place]) Diagnosis:  Principal Problem:   MDD (major depressive disorder), recurrent severe, without psychosis ([place]) Active Problems:   Generalized abdominal pain   Obesity   Nausea and vomiting   Chronic abdominal pain   Conversion disorder with attacks or seizures, acute episode, with psychological stressor   DKA, type 1 ([place])   Hypertensive urgency  Total Time spent with patient: 1 hour  Subjective:   Nancy [name] is a 18 y.o. female patient admitted with complaints of depression 9/10, anxiety and difficulty sleeping.  Denies suicidal ideations along with homicidal ideations, hallucinations and substance abuse.  She currently gets her medications at the [facility] but plans to continue her care at [facility] for therapy and med management.  Also encouraged her to use her campus counseling services.  Complains of difficulty sleeping with trazodone being ineffective.  Discussed medications and agreeable to decrease Abilify and start low-dose Seroquel for sleep and mood.  First interaction with this client with inability to determine if her multiple somatic symptoms and seizures are related to psychiatry.  There is obviously some carryover into somatic issues.  This could be resolved with much therapy and medication management.  Client is not interested in coming inpatient for help as she does not feel like she is at a point of needing this.  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  Appetite remains poor according the patient but intake is good in the hospital.  Unsure if the weight loss she reported 40 pounds in the past 3 months.  Dysthymic mood on assessment, inpatient  psychiatric care not recommended at this time.  Safety plan discussed with the patient of what she would do if she had suicidal thoughts with encouragement to utilize the crisis numbers.  Again, patient appears to have dysthymia but not meeting criteria for inpatient hospitalization at this time.  Patient seen in person and chart reviewed. Reportedly, the patient was found down on the floor by family and was unresponsive prior to her admission. She does not recall how she ended up on the floor, but stated that she her medications were recently changed. Complains of continued nausea and vomiting and she also reports a 40 pound weight loss in 3 months. Episodes of emesis werenoted to be bright red and coffee-ground discusses previous evaluations by [facility] where she notes being worked up and told that she did not have gastroparesis. Associated symptoms include constipation. Patient still reports being able to pass flatus.She had been taking her Lantus 52 units at night, but reports that she had not been taking the Humalog with meals because she was unable to keep any significant amount of food down.  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 Reportedly, the patient was found down on the floor by family and was unresponsive. She does not recall how she ended up on the floor, but states that she has been having seizures. However, appears to have been taken off Keppra because it was "too strong". Complains of throwing up blood and her "stomach has been all  messed up". Episodes of emesis werenoted to be bright red and coffee-ground discusses previous evaluations by [facility] where she notes being worked up and told that she did  not have gastroparesis. Associated symptoms include constipation. Patient still reports being able to pass flatus.She had been taking her Lantus 52 units at night, but reports that she had not been taking the Humalog with meals because she was unable to keep any significant amount of food down. 10/3: c/o hematemesis; ongoing N/V  Past Psychiatric History: Patient reports one previous psychiatric hospitalizations at [facility] behavioral health.  She has been seeing outpatient mental health providers along with her medical providers in [place].  Current medications include Lexapro and Prozac both at 20 mg/day, sertraline 400 mg at night, lamotrigine 25 mg twice a day and trazodone 300 mg at night.  Patient says she has been on these for a couple of years and does not find them to be providing any benefit.  Denies any substance abuse problems.  Patient does have a past history of suicide attempts saying that she had made an overdose attempt most recently about 2-1/2 months ago Patient reports that she has chronic depression, that has been present for years.  Per chart she relates quite a bit of it to the death of her father in Apr 30, 2011.  She reports to have felt anxious negative depressed and guilty ever since then.  Summertime is often worse because it is the anniversary of his death.  Sleep is poor.  Energy poor.  Appetite is poor.  Patient says that she has been compliant with all of her outpatient medications and treatments but continues to have serious medical problems.  Patient is currently getting her outpatient mental health care through I believe a public clinic in [place].  Denies alcohol or drug abuse.  Denies hallucinations or psychotic symptoms.    Risk to Self:  None Risk to Others:  None Prior Inpatient Therapy:  Yes Prior Outpatient Therapy:  Yes [facility]  Past Medical History:  Past Medical History:  Diagnosis Date  . Acanthosis nigricans   . Anxiety   . CHF (congestive heart  failure) (HCC)   . Chronic lower back pain   . Depression   . DKA, type 1 (HCC) 09/13/2018  . Dyspepsia   . Obesity   . Ovarian cyst    pt is not aware of this hx (11/24/2017)  . Pre-diabetes   . 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 Premature baby   . Seizures (HCC)   . Type II diabetes mellitus (HCC)    insulin dependant    Past Surgical History:  Procedure Laterality Date  . ABDOMINAL HERNIA REPAIR     "I was a baby"  . HERNIA REPAIR    . TONSILLECTOMY AND ADENOIDECTOMY    . WISDOM TOOTH EXTRACTION  2015/04/30   Family History:  Family History  Problem Relation Age of Onset  . Diabetes Mother   . Hypertension Mother   . Obesity Mother   . Asthma Mother   . Allergic rhinitis Mother   . Eczema Mother   . Diabetes Father   . Hypertension Father   . Obesity Father   . Hyperlipidemia Father   . Hypertension Paternal Aunt   . Hypertension Maternal Grandfather   . Colon cancer Maternal Grandfather   . Diabetes Paternal Grandmother   . Obesity Paternal Grandmother   . Diabetes Paternal Grandfather   . Obesity Paternal Grandfather   . Angioedema Neg Hx   .  Immunodeficiency Neg Hx   . Urticaria Neg Hx   . Stomach cancer Neg Hx   . Esophageal cancer Neg Hx    Family Psychiatric  History:  Patient reports that she has a sister with major depression and believes that her father may have had bipolar disorder.  No family history of suicide Social History:  Social History   Substance and Sexual Activity  Alcohol Use No  . Alcohol/week: 0.0 standard drinks     Social History   Substance and Sexual Activity  Drug Use No    Social History   Socioeconomic History  . Marital status: Single    Spouse name: Not on file  . Number of children: 0  . Years of education: Not on file  . Highest education level: Not on file  Occupational History  . Occupation: Admission  Social Needs  . Financial resource strain: Not on file  . Food insecurity    Worry: Not on file    Inability:  Not on file  . Transportation needs    Medical: Not on file    Non-medical: Not on file  Tobacco Use  . Smoking status: Never Smoker  . Smokeless tobacco: Never Used  Substance and Sexual Activity  . Alcohol use: No    Alcohol/week: 0.0 standard drinks  . Drug use: No  . Sexual activity: Never  Lifestyle  . Physical activity    Days per week: Not on file    Minutes per session: Not on file  . Stress: Not on file  Relationships  . Social Musicianconnections    Talks on phone: Not on file    Gets together: Not on file    Attends religious service: Not on file    Active member of club or organization: Not on file    Attends meetings of clubs or organizations: Not on file    Relationship status: Not on file  Other Topics Concern  . Not on file  Social History Narrative   Lives with mom and mom's girlfriend.   No EtOH, tobacco, Drugs   Additional Social History:    Allergies:   Allergies  Allergen Reactions  . Ibuprofen Other (See Comments)    GI MD said to not take this anymore    Labs:  Results for orders placed or performed during the hospital encounter of 10/08/18 (from the past 48 hour(s))  Glucose, capillary     Status: Abnormal   Collection Time: 10/09/18  9:04 PM  Result Value Ref Range   Glucose-Capillary 239 (H) 70 - 99 mg/dL  Hemoglobin and hematocrit, blood     Status: None   Collection Time: 10/09/18  9:55 PM  Result Value Ref Range   Hemoglobin 12.5 12.0 - 15.0 g/dL   HCT 16.137.6 09.636.0 - 04.546.0 %    Comment: Performed at [facility], [name] [place], [place] [id number]  CBC with Differential/Platelet     Status: None   Collection Time: 10/10/18  7:54 AM  Result Value Ref Range   WBC 6.4 4.0 - 10.5 K/uL   RBC 4.28 3.87 - 5.11 MIL/uL   Hemoglobin 12.5 12.0 - 15.0 g/dL   HCT 19.137.0 47.836.0 - 29.546.0 %   MCV 86.4 80.0 - 100.0 fL   MCH 29.2 26.0 - 34.0 pg   MCHC 33.8 30.0 - 36.0 g/dL   RDW 62.113.1 30.811.5 - 65.715.5 %   Platelets 312 150 - 400 K/uL   nRBC 0.0 0.0 -  0.2 %   Neutrophils Relative % 68 %   Neutro Abs 4.4 1.7 - 7.7 K/uL   Lymphocytes Relative 21 %   Lymphs Abs 1.3 0.7 - 4.0 K/uL   Monocytes Relative 10 %   Monocytes Absolute 0.6 0.1 - 1.0 K/uL   Eosinophils Relative 0 %   Eosinophils Absolute 0.0 0.0 - 0.5 K/uL   Basophils Relative 1 %   Basophils Absolute 0.0 0.0 - 0.1 K/uL   Immature Granulocytes 0 %   Abs Immature Granulocytes 0.02 0.00 - 0.07 K/uL    Comment: Performed at [facility], [address], [place], [place] [address]  Magnesium     Status: None   Collection Time: 10/10/18  7:54 AM  Result Value Ref Range   Magnesium 2.1 1.7 - 2.4 mg/dL    Comment: Performed at [facility], [address], [place], [place] [address]  Renal function panel     Status: Abnormal   Collection Time: 10/10/18  7:54 AM  Result Value Ref Range   Sodium 137 135 - 145 mmol/L   Potassium 3.3 (L) 3.5 - 5.1 mmol/L   Chloride 107 98 - 111 mmol/L   CO2 20 (L) 22 - 32 mmol/L   Glucose, Bld 184 (H) 70 - 99 mg/dL   BUN <5 (L) 6 - 20 mg/dL   Creatinine, Ser 0.98 (L) 0.44 - 1.00 mg/dL   Calcium 8.3 (L) 8.9 - 10.3 mg/dL   Phosphorus 1.9 (L) 2.5 - 4.6 mg/dL   Albumin 3.5 3.5 - 5.0 g/dL   GFR calc non Af Amer >60 >60 mL/min   GFR calc Af Amer >60 >60 mL/min   Anion gap 10 5 - 15    Comment: Performed at [facility], [name] [place], [place] [address]  Glucose, capillary     Status: Abnormal   Collection Time: 10/10/18  8:12 AM  Result Value Ref Range   Glucose-Capillary 196 (H) 70 - 99 mg/dL  MRSA PCR Screening     Status: None   Collection Time: 10/10/18  8:51 AM   Specimen: Nasal Mucosa; Nasopharyngeal  Result Value Ref Range   MRSA by PCR NEGATIVE NEGATIVE    Comment:        The GeneXpert MRSA Assay (FDA approved for NASAL specimens only), is one component of a comprehensive MRSA colonization surveillance program. It is not intended to diagnose MRSA infection nor to guide or monitor treatment for MRSA  infections. Performed at [facility], [address] [place], [place] [address]   Glucose, capillary     Status: Abnormal   Collection Time: 10/10/18 11:15 AM  Result Value Ref Range   Glucose-Capillary 141 (H) 70 - 99 mg/dL  Hemoglobin and hematocrit, blood     Status: None   Collection Time: 10/10/18  3:00 PM  Result Value Ref Range   Hemoglobin 12.3 12.0 - 15.0 g/dL   HCT 62.1 30.8 - 65.7 %    Comment: Performed at [facility], [address], [place], [place] [address]  Glucose, capillary     Status: Abnormal   Collection Time: 10/10/18  4:34 PM  Result Value Ref Range   Glucose-Capillary 198 (H) 70 - 99 mg/dL  Hemoglobin and hematocrit, blood     Status: None   Collection Time: 10/10/18  9:44 PM  Result Value Ref Range   Hemoglobin 12.3 12.0 - 15.0 g/dL   HCT 29.5 28.4 - 13.2 %  Comment: Performed at [facility], [address], [place], [place] [address]  Glucose, capillary     Status: Abnormal   Collection Time: 10/10/18  9:48 PM  Result Value Ref Range   Glucose-Capillary 165 (H) 70 - 99 mg/dL  Glucose, capillary     Status: Abnormal   Collection Time: 10/11/18  8:03 AM  Result Value Ref Range   Glucose-Capillary 162 (H) 70 - 99 mg/dL  CBC with Differential/Platelet     Status: None   Collection Time: 10/11/18 10:22 AM  Result Value Ref Range   WBC 7.6 4.0 - 10.5 K/uL   RBC 4.44 3.87 - 5.11 MIL/uL   Hemoglobin 12.8 12.0 - 15.0 g/dL   HCT 60.4 54.0 - 98.1 %   MCV 85.1 80.0 - 100.0 fL   MCH 28.8 26.0 - 34.0 pg   MCHC 33.9 30.0 - 36.0 g/dL   RDW 19.1 47.8 - 29.5 %   Platelets 331 150 - 400 K/uL   nRBC 0.0 0.0 - 0.2 %   Neutrophils Relative % 66 %   Neutro Abs 5.0 1.7 - 7.7 K/uL   Lymphocytes Relative 22 %   Lymphs Abs 1.7 0.7 - 4.0 K/uL   Monocytes Relative 12 %   Monocytes Absolute 0.9 0.1 - 1.0 K/uL   Eosinophils Relative 0 %   Eosinophils Absolute 0.0 0.0 - 0.5 K/uL   Basophils Relative 0 %   Basophils Absolute 0.0 0.0 - 0.1 K/uL    Immature Granulocytes 0 %   Abs Immature Granulocytes 0.03 0.00 - 0.07 K/uL    Comment: Performed at [facility], [address], [place], [place] [address]  Magnesium     Status: None   Collection Time: 10/11/18 10:22 AM  Result Value Ref Range   Magnesium 2.0 1.7 - 2.4 mg/dL    Comment: Performed at [facility], [address] [place], [place] [address]  Renal function panel     Status: Abnormal   Collection Time: 10/11/18 10:22 AM  Result Value Ref Range   Sodium 137 135 - 145 mmol/L   Potassium 3.5 3.5 - 5.1 mmol/L   Chloride 106 98 - 111 mmol/L   CO2 22 22 - 32 mmol/L   Glucose, Bld 169 (H) 70 - 99 mg/dL   BUN <5 (L) 6 - 20 mg/dL   Creatinine, Ser 4.69 0.44 - 1.00 mg/dL   Calcium 8.7 (L) 8.9 - 10.3 mg/dL   Phosphorus 2.4 (L) 2.5 - 4.6 mg/dL   Albumin 3.7 3.5 - 5.0 g/dL   GFR calc non Af Amer >60 >60 mL/min   GFR calc Af Amer >60 >60 mL/min   Anion gap 9 5 - 15    Comment: Performed at [facility], [address], [place], [place] [address]  Glucose, capillary     Status: Abnormal   Collection Time: 10/11/18 11:43 AM  Result Value Ref Range   Glucose-Capillary 161 (H) 70 - 99 mg/dL  Lipase, blood     Status: None   Collection Time: 10/11/18  4:09 PM  Result Value Ref Range   Lipase 22 11 - 51 U/L    Comment: Performed at [facility], [address], [place], [place] [address]  Glucose, capillary     Status: Abnormal   Collection Time: 10/11/18  4:24 PM  Result Value Ref Range   Glucose-Capillary 158 (H) 70 - 99 mg/dL    Current Facility-Administered Medications  Medication Dose Route Frequency Provider Last Rate Last Dose  . 0.9 %  sodium chloride infusion   Intravenous Continuous Madelyn FlavorsSmith, Rondell A, MD 100 mL/hr at 10/11/18 1633    . acetaminophen (TYLENOL) tablet 650 mg  650 mg Oral Q6H PRN Clydie BraunSmith, Rondell A, MD   650 mg at 10/09/18 [id number]   Or  . acetaminophen (TYLENOL) suppository 650 mg  650 mg Rectal Q6H PRN Madelyn FlavorsSmith, Rondell A, MD      .  albuterol (PROVENTIL) (2.5 MG/3ML) 0.083% nebulizer solution 2.5 mg  2.5 mg Nebulization Q2H PRN Smith, Rondell A, MD      . amLODipine (NORVASC) tablet 5 mg  5 mg Oral Daily Kyle, Tyrone A, DO   5 mg at 10/11/18 0932  . ARIPiprazole (ABILIFY) tablet 20 mg  20 mg Oral Daily Madelyn FlavorsSmith, Rondell A, MD   20 mg at 10/11/18 0933  . atorvastatin (LIPITOR) tablet 20 mg  20 mg Oral q1800 Madelyn FlavorsSmith, Rondell A, MD   20 mg at 10/10/18 1641  . bisacodyl (DULCOLAX) suppository 10 mg  10 mg Rectal Daily Dianah FieldGribbin, Sarah J, PA-C   10 mg at 10/11/18 1532  . docusate sodium (COLACE) capsule 100 mg  100 mg Oral BID Ronaldo MiyamotoKyle, Tyrone A, DO   100 mg at 10/11/18 0933  . FLUoxetine (PROZAC) capsule 40 mg  40 mg Oral QHS Madelyn FlavorsSmith, Rondell A, MD   40 mg at 10/10/18 2148  . hydrOXYzine (ATARAX/VISTARIL) tablet 25 mg  25 mg Oral TID PRN Madelyn FlavorsSmith, Rondell A, MD   25 mg at 10/11/18 1253  . insulin aspart (novoLOG) injection 0-9 Units  0-9 Units Subcutaneous TID WC Clydie BraunSmith, Rondell A, MD   2 Units at 10/11/18 1252  . insulin aspart (novoLOG) injection 15 Units  15 Units Subcutaneous BID WC Clydie BraunSmith, Rondell A, MD   15 Units at 10/10/18 0843  . insulin glargine (LANTUS) injection 50 Units  50 Units Subcutaneous QHS Kyle, Tyrone A, DO   50 Units at 10/10/18 2153  . labetalol (NORMODYNE) injection 5 mg  5 mg Intravenous Q2H PRN Madelyn FlavorsSmith, Rondell A, MD   5 mg at 10/11/18 0021  . lamoTRIgine (LAMICTAL) tablet 25 mg  25 mg Oral BID Madelyn FlavorsSmith, Rondell A, MD   25 mg at 10/11/18 0933  . magnesium hydroxide (MILK OF MAGNESIA) suspension 15 mL  15 mL Oral Daily PRN Ronaldo MiyamotoKyle, Tyrone A, DO   15 mL at 10/11/18 0943  . metoCLOPramide (REGLAN) injection 10 mg  10 mg Intravenous Q6H Esterwood, Amy S, PA-C   10 mg at 10/11/18 1253  . pantoprazole (PROTONIX) injection 40 mg  40 mg Intravenous BID Ronaldo MiyamotoKyle, Tyrone A, DO   40 mg at 10/11/18 0935  . potassium chloride SA (KLOR-CON) CR tablet 40 mEq  40 mEq Oral BID Ronaldo MiyamotoKyle, Tyrone A, DO   40 mEq at 10/11/18 0933  . promethazine (PHENERGAN)  injection 12.5-25 mg  12.5-25 mg Intravenous Q6H Esterwood, Amy S, PA-C   25 mg at 10/11/18 1505  . sodium chloride flush (NS) 0.9 % injection 3 mL  3 mL Intravenous Q12H Smith, Rondell A, MD   3 mL at 10/11/18 0937  . topiramate (TOPAMAX) tablet 25 mg  25 mg Oral BID Madelyn FlavorsSmith, Rondell A, MD   25 mg at 10/11/18 0934  . traMADol (ULTRAM) tablet 50 mg  50 mg Oral Q6H PRN Ronaldo MiyamotoKyle, Tyrone A, DO   50 mg at 10/10/18 0313  . traZODone (DESYREL) tablet 300 mg  300 mg Oral QHS Katrinka BlazingSmith,  Rondell A, MD   300 mg at 10/10/18 2149    Musculoskeletal: Strength & Muscle Tone: within normal limits Gait & Station: normal Patient leans: N/A  Psychiatric Specialty Exam: Physical Exam  Nursing note and vitals reviewed. Constitutional: She is oriented to person, place, and time. She appears well-developed and well-nourished.  HENT:  Head: Normocephalic.  Neck: Neck supple.  Respiratory: Effort normal.  Musculoskeletal: Normal range of motion.  Neurological: She is alert and oriented to person, place, and time.  Psychiatric: Her speech is normal. Judgment and thought content normal. Her mood appears anxious. She is slowed and withdrawn. Thought content is not paranoid and not delusional. Cognition and memory are normal. She does not express impulsivity or inappropriate judgment. She exhibits a depressed mood. She expresses no homicidal ideation.    Review of Systems  Constitutional: Negative.  Negative for fever.  HENT: Negative.   Eyes: Negative.   Respiratory: Negative.   Cardiovascular: Negative.   Gastrointestinal: Positive for nausea and vomiting.  Genitourinary: Negative.   Musculoskeletal: Negative.  Negative for falls.  Skin: Negative.  Negative for rash.  Neurological: Negative.  Negative for tremors.  Endo/Heme/Allergies: Negative.   Psychiatric/Behavioral: Positive for depression. Negative for hallucinations, substance abuse and suicidal ideas. The patient is nervous/anxious and has insomnia.      Blood pressure (!) 135/95, pulse (!) 117, temperature 98.9 F (37.2 C), temperature source Oral, resp. rate 16, height 5' 2.99" (1.6 m), weight 89.8 kg, last menstrual period 09/27/2018, SpO2 100 %.Body mass index is 35.08 kg/m.  General Appearance: Casual  Eye Contact:  Good  Speech:  Normal Rate  Volume:  Normal  Mood:  Anxious and Depressed  Affect:  Congruent  Thought Process:  Coherent and Descriptions of Associations: Intact  Orientation:  Full (Time, Place, and Person)  Thought Content:  WDL and Logical  Suicidal Thoughts:  No  Homicidal Thoughts:  No  Memory:  Immediate;   Good Recent;   Good Remote;   Good  Judgement:  Fair  Insight:  Good  Psychomotor Activity:  Decreased  Concentration:  Concentration: Good and Attention Span: Good  Recall:  Good  Fund of Knowledge:  Good  Language:  Good  Akathisia:  No  Handed:  Right  AIMS (if indicated):     Assets:  Housing Leisure Time Physical Health Resilience Social Support Vocational/Educational  ADL's:  Intact  Cognition:  WNL  Sleep:       Treatment Plan Summary: Bipolar affective disorder depressed moderate: -Decreased Abilify 20 mg daily to 5 mg daily -Continue Prozac 40 mg daily -Continued Lexapro 20 mg daily -Follow-up with [facility] for therapy and medication management  Insomnia and mood: -Discontinue trazodone 300 mg at bedtime -Start Seroquel 50 mg at bedtime  Anxiety: -Continue hydroxyzine 25 mg 3 times daily as needed  Disposition: No evidence of imminent risk to self or others at present.   Discussed crisis plan, support from social network, calling 911, coming to the Emergency Department, and calling Suicide Hotline.  Nanine Means, NP 10/11/2018 5:29 PM

## 2018-10-11 NOTE — Progress Notes (Addendum)
Daily Rounding Note  10/11/2018, 2:03 PM  LOS: 2 days   SUBJECTIVE:   Chief complaint:   Nausea, vomiting, hematemesis in the setting of DKA. Nausea and diffuse abdominal pain persist.  Vomited this morning scant streaks of blood and otherwise green bilious emesis.  Vomiting occurs whether she eats or not.  Did not tolerate clear or full liquids.  No bowel movements since Thursday, 4 days ago.  Normally has a bowel movement about every 2 or 3 days.  Feels like if she could have a bowel movement she would feel better and her abdominal pressure would improve.  OBJECTIVE:         Vital signs in last 24 hours:    Temp:  [98.8 F (37.1 C)-99.2 F (37.3 C)] 99.1 F (37.3 C) (10/05 0755) Pulse Rate:  [100-115] 101 (10/05 0755) Resp:  [16-26] 25 (10/05 0755) BP: (122-169)/(73-103) 122/73 (10/05 0755) SpO2:  [100 %] 100 % (10/05 0755) Last BM Date: 10/07/18 Filed Weights   10/08/18 1052  Weight: 89.8 kg   General: Obese, comfortable appearing.  Somewhat chronically ill-appearing Heart: RRR Chest: Clear bilaterally Abdomen: Hypoactive bowel sounds but none tinkling or tympanitic.  Soft.  Not tender though she says it is she has no reaction to my exam i.e. no wincing guarding, rebound. Extremities: No CCE. Neuro/Psych: Cooperative, oriented x3.  No gross deficits.  Intake/Output from previous day: 10/04 0701 - 10/05 0700 In: 2332 [I.V.:2172.6; IV Piggyback:159.4] Out: -   Intake/Output this shift: Total I/O In: 3 [I.V.:3] Out: -   Lab Results: Recent Labs    10/09/18 0500  10/10/18 0754 10/10/18 1500 10/10/18 2144 10/11/18 1022  WBC 7.8  --  6.4  --   --  7.6  HGB 11.0*   < > 12.5 12.3 12.3 12.8  HCT 33.6*   < > 37.0 37.7 37.6 37.8  PLT 291  --  312  --   --  331   < > = values in this interval not displayed.   BMET Recent Labs    10/09/18 0500 10/10/18 0754 10/11/18 1022  NA 137 137 137  K 3.7 3.3* 3.5   CL 110 107 106  CO2 21* 20* 22  GLUCOSE 195* 184* 169*  BUN 7 <5* <5*  CREATININE 0.42* 0.40* 0.46  CALCIUM 7.8* 8.3* 8.7*   LFT Recent Labs    10/10/18 0754 10/11/18 1022  ALBUMIN 3.5 3.7   PT/INR No results for input(s): LABPROT, INR in the last 72 hours. Hepatitis Panel No results for input(s): HEPBSAG, HCVAB, HEPAIGM, HEPBIGM in the last 72 hours.  Studies/Results: No results found.   Scheduled Meds: . amLODipine  5 mg Oral Daily  . ARIPiprazole  20 mg Oral Daily  . atorvastatin  20 mg Oral q1800  . docusate sodium  100 mg Oral BID  . FLUoxetine  40 mg Oral QHS  . insulin aspart  0-9 Units Subcutaneous TID WC  . insulin aspart  15 Units Subcutaneous BID WC  . insulin glargine  50 Units Subcutaneous QHS  . lamoTRIgine  25 mg Oral BID  . metoCLOPramide (REGLAN) injection  10 mg  Intravenous Q6H  . pantoprazole (PROTONIX) IV  40 mg Intravenous BID  . potassium chloride  40 mEq Oral BID  . promethazine  12.5-25 mg Intravenous Q6H  . sodium chloride flush  3 mL Intravenous Q12H  . topiramate  25 mg Oral BID  . traZODone  300 mg Oral QHS   Continuous Infusions: . sodium chloride 100 mL/hr at 10/11/18 0556   PRN Meds:.acetaminophen **OR** acetaminophen, albuterol, hydrOXYzine, labetalol, magnesium hydroxide, traMADol   ASSESMENT:   *    Nausea, vomiting, hematemesis.  Suspect gastroparesis in setting of DKA. Chronic PPI at home.   Currently on Protonix 40 IV bid, every 6 hour IV Reglan  *     Chronic abdominal pain.  ?  Diabetic visceral neuropathy. Underwent EGD 04/2017 revealing retained gastric fluid.  Subsequent normal gastric emptying study. CTAP 10/08/2018 unrevealing as to cause of abdominal pain or nausea, vomiting.  *     Depression.  Overnight psych admission 10/02/2018 due to suicidal ideation.  On several psychiatric medications.  Hx psychogenic seizures.  Suspect depression may be leading to functional gastrointestinal issues.  *  IDDM.  Blood glucose  355 at admission   PLAN   *   k pad.   Dulcolax suppository.  CBC in the morning. Continue scheduled Reglan, IV Protonix Patient just got off the phone ordering soup and a sandwich.    Azucena Freed  10/11/2018, 2:03 PM Phone [phone]     Attending physician's note   I have taken an interval history, reviewed the chart and examined the patient. I agree with the Advanced Practitioner's note, impression and recommendations.    Seen this morning in rounds. "worst N/V/abdo pain ever in my life". Subjective complaints> objective findings. Still, I believe we should give her reasonable benefit of doubt.   Plan: -Continue IV Protonix -Continue Reglan -EGD in a.m. (will give her some reassurance even if it is negative) -Continue supportive treatment. -Do recommend psych consultation.   Carmell Austria, MD [facility] [phone].

## 2018-10-11 NOTE — Progress Notes (Signed)
  Echocardiogram 2D Echocardiogram has been performed.  Nancy [name] 10/11/2018, 12:34 PM

## 2018-10-11 NOTE — Progress Notes (Signed)
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 Reportedly, the patient was found down on the floor by family and was unresponsive. She does not recall how she ended up on the floor, but states that she has been having seizures. However, appears to have been taken off Keppra because it was "too strong". Complains of throwing up blood and her "stomach has been all messed up". Episodes of emesis werenoted to be bright red and coffee-ground discusses previous evaluations by [facility] where she notes being worked up and told that she did not have gastroparesis. Associated symptoms include constipation. Patient still reports being able to pass flatus.She had been taking her Lantus 52 units at night, but reports that she had not been taking the Humalog with meals because she was unable to keep any significant amount of food down.  10/3: c/o hematemesis; ongoing N/V 10/4: c/o N/V ON, able to keep down some chicken broth she says; ab pain not mentioned this AM; reports constipation 10/5: reports syncopal episode this AM, continued N/V   Assessment & Plan:   Active Problems:   Obesity   Nausea and vomiting   Generalized abdominal pain   Chronic abdominal pain   MDD (major depressive disorder), recurrent severe, without psychosis (HCC)   Conversion disorder with attacks or seizures, acute episode, with psychological stressor   DKA, type 1 (HCC)   Hypertensive urgency   DKA: Acute on chronic  - Patient presents with elevated blood glucose of 355 with CO2 21 and anion gap 16.  - fluids, novolog 15 units  BID WM, lantus 50 units daily  Abdominal pain: Acute on chronic.  - CT scan of the abdomen and pelvis did not show any acute abnormalities and noted a small cystic lesion in the right wall of the vagina thought to be a [employer] fast streamer ductal cysts unchanged. - chronic pain; PRN pain control.     - check lipase     - GI onboard; EGD in AM.  Nausea, vomiting/hematemesis: - followed by [facility] - has been on "every" nausea medicine per her report; and nothing works - GI onboard; appreciate assistance     - IV reglan, protonix     - Hgb is stable  Hypertensive urgency: Acute.  - Blood pressures elevated up to 161/106 on admission.  - Patient not on blood pressure medications at home. - Labetalol IV as needed     - amlodipine 5 mg PO; can increase to 10mg .  Sinus tachycardia: Acute on chronic.  - Heart rates into the 120s on admission.  - Patient previously was on propranolol in the past. - fluids, monitor     - check TSH/FT4  Anxiety/depression:  - Patient follows in outpatient setting with [facility].  - She was seen by PCP 3 days ago, and Lexapro was recommended to be discontinued by PCP and Seroquel was stopped. - Continue Abilify, Prozac, Lamictal, and hydroxyzine as needed - Continue outpatient follow-up with [facility] on October 19  Psychogenic seizures:  - Evaluated and previously noted not to have epileptiform seizures.  - Seizure precautions  Obesity:  - BMI 35.08 kg/m - diet, exercise counseled  Constipation     -  colace, PRN MoM  Syncopal episode?     - per nursing, pt reported that she was going to pass out this AM; they went to check on her and she was on the floor     - no head injury reported.      - check echo     - EKG with sinus tach     - orthostatics ok  DVT prophylaxis:lovenox Code Status:FULL  Disposition Plan:TBD  Consultants:  GI  ROS:  Reports  passing out this AM, reports ab pain, N, V. Remainder 10-pt ROS is negative for all not previously mentioned.  Subjective: "I don't want to eat."  Objective: Vitals:   10/11/18 0403 10/11/18 0500 10/11/18 0600 10/11/18 0755  BP: (!) 148/99   122/73  Pulse: 100   (!) 101  Resp: 17 (!) 26 20 (!) 25  Temp: 99.2 F (37.3 C)   99.1 F (37.3 C)  TempSrc: Oral   Oral  SpO2: 100%   100%  Weight:      Height:        Intake/Output Summary (Last 24 hours) at 10/11/2018 1534 Last data filed at 10/11/2018 1941 Gross per 24 hour  Intake 3 ml  Output -  Net 3 ml   Filed Weights   10/08/18 1052  Weight: 89.8 kg    Examination:  General: 18 y.o. female resting in bed in NAD Eyes: PERRL, normal sclera ENMT: Nares patent w/o discharge, orophaynx clear, dentition normal, ears w/o discharge/lesions/ulcers Cardiovascular: tachy regular, +S1, S2, no m/g/r, equal pulses throughout Respiratory: CTABL, no w/r/r, normal WOB GI: BS+, NDNT, no masses noted, no organomegaly noted MSK: No e/c/c Neuro: A&O x 3, no focal deficits Psyc: flat affect, calm, cooperative   Data Reviewed: I have personally reviewed following labs and imaging studies.  CBC: Recent Labs  Lab 10/05/18 1133  10/08/18 1104 10/09/18 0500  10/09/18 2155 10/10/18 0754 10/10/18 1500 10/10/18 2144 10/11/18 1022  WBC 8.5  --  9.7 7.8  --   --  6.4  --   --  7.6  NEUTROABS 5.1  --   --   --   --   --  4.4  --   --  5.0  HGB 12.8   < > 13.7 11.0*   < > 12.5 12.5 12.3 12.3 12.8  HCT 40.8   < > 40.4 33.6*   < > 37.6 37.0 37.7 37.6 37.8  MCV 89  --  86.0 88.4  --   --  86.4  --   --  85.1  PLT 329  --  322 291  --   --  312  --   --  331   < > = values in this interval not displayed.   Basic Metabolic Panel: Recent Labs  Lab 10/08/18 1104 10/08/18 1529 10/09/18 0500 10/10/18 0754 10/11/18 1022  NA 134* 134* 137 137 137  K 4.2 3.5 3.7 3.3* 3.5  CL 97* 102 110 107 106  CO2 21* 20* 21* 20* 22  GLUCOSE 355* 282*  195* 184* 169*  BUN 11 10 7  <5* <5*  CREATININE 0.84 0.61 0.42* 0.40* 0.46  CALCIUM 9.5 8.6* 7.8* 8.3* 8.7*  MG  --   --  2.2 2.1 2.0  PHOS  --   --   --  1.9* 2.4*   GFR: Estimated Creatinine Clearance: 119.4 mL/min (by C-G formula based on SCr of 0.46 mg/dL). Liver Function Tests: Recent Labs  Lab 10/05/18 1133 10/10/18  7341 10/11/18 1022  AST 15  --   --   ALT 19  --   --   ALKPHOS 88  --   --   BILITOT <0.2  --   --   PROT 7.2  --   --   ALBUMIN 4.7 3.5 3.7   Recent Labs  Lab 10/08/18 1104  LIPASE 20   No results for input(s): AMMONIA in the last 168 hours. Coagulation Profile: No results for input(s): INR, PROTIME in the last 168 hours. Cardiac Enzymes: No results for input(s): CKTOTAL, CKMB, CKMBINDEX, TROPONINI in the last 168 hours. BNP (last 3 results) No results for input(s): PROBNP in the last 8760 hours. HbA1C: No results for input(s): HGBA1C in the last 72 hours. CBG: Recent Labs  Lab 10/10/18 1115 10/10/18 1634 10/10/18 2148 10/11/18 0803 10/11/18 1143  GLUCAP 141* 198* 165* 162* 161*   Lipid Profile: No results for input(s): CHOL, HDL, LDLCALC, TRIG, CHOLHDL, LDLDIRECT in the last 72 hours. Thyroid Function Tests: No results for input(s): TSH, T4TOTAL, FREET4, T3FREE, THYROIDAB in the last 72 hours. Anemia Panel: No results for input(s): VITAMINB12, FOLATE, FERRITIN, TIBC, IRON, RETICCTPCT in the last 72 hours. Sepsis Labs: No results for input(s): PROCALCITON, LATICACIDVEN in the last 168 hours.  Recent Results (from the past 240 hour(s))  MRSA PCR Screening     Status: None   Collection Time: 10/10/18  8:51 AM   Specimen: Nasal Mucosa; Nasopharyngeal  Result Value Ref Range Status   MRSA by PCR NEGATIVE NEGATIVE Final    Comment:        The GeneXpert MRSA Assay (FDA approved for NASAL specimens only), is one component of a comprehensive MRSA colonization surveillance program. It is not intended to diagnose MRSA infection nor to  guide or monitor treatment for MRSA infections. Performed at [facility], [address] [place], [place] [address]       Radiology Studies: No results found.   Scheduled Meds: . amLODipine  5 mg Oral Daily  . ARIPiprazole  20 mg Oral Daily  . atorvastatin  20 mg Oral q1800  . bisacodyl  10 mg Rectal Daily  . docusate sodium  100 mg Oral BID  . FLUoxetine  40 mg Oral QHS  . insulin aspart  0-9 Units Subcutaneous TID WC  . insulin aspart  15 Units Subcutaneous BID WC  . insulin glargine  50 Units Subcutaneous QHS  . lamoTRIgine  25 mg Oral BID  . metoCLOPramide (REGLAN) injection  10 mg Intravenous Q6H  . pantoprazole (PROTONIX) IV  40 mg Intravenous BID  . potassium chloride  40 mEq Oral BID  . promethazine  12.5-25 mg Intravenous Q6H  . sodium chloride flush  3 mL Intravenous Q12H  . topiramate  25 mg Oral BID  . traZODone  300 mg Oral QHS   Continuous Infusions: . sodium chloride 100 mL/hr at 10/11/18 0556     LOS: 2 days    Time spent: 25 minutes spent in the coordination of care today.    Teddy Spike, DO [facility] Pager [phone]  If 7PM-7AM, please contact night-coverage [email or web address] Password [facility] 10/11/2018, 3:34 PM

## 2018-10-11 NOTE — Progress Notes (Signed)
   10/11/18 1054  Orthostatic Lying   BP- Lying (!) 161/101  Pulse- Lying 110  Orthostatic Sitting  BP- Sitting (!) 147/103  Pulse- Sitting 105  Orthostatic Standing at 0 minutes  BP- Standing at 0 minutes 127/88  Pulse- Standing at 0 minutes 138

## 2018-10-11 NOTE — H&P (View-Only) (Signed)
                                                             Daily Rounding Note  10/11/2018, 2:03 PM  LOS: 2 days   SUBJECTIVE:   Chief complaint:   Nausea, vomiting, hematemesis in the setting of DKA. Nausea and diffuse abdominal pain persist.  Vomited this morning scant streaks of blood and otherwise green bilious emesis.  Vomiting occurs whether she eats or not.  Did not tolerate clear or full liquids.  No bowel movements since Thursday, 4 days ago.  Normally has a bowel movement about every 2 or 3 days.  Feels like if she could have a bowel movement she would feel better and her abdominal pressure would improve.  OBJECTIVE:         Vital signs in last 24 hours:    Temp:  [98.8 F (37.1 C)-99.2 F (37.3 C)] 99.1 F (37.3 C) (10/05 0755) Pulse Rate:  [100-115] 101 (10/05 0755) Resp:  [16-26] 25 (10/05 0755) BP: (122-169)/(73-103) 122/73 (10/05 0755) SpO2:  [100 %] 100 % (10/05 0755) Last BM Date: 10/07/18 Filed Weights   10/08/18 1052  Weight: 89.8 kg   General: Obese, comfortable appearing.  Somewhat chronically ill-appearing Heart: RRR Chest: Clear bilaterally Abdomen: Hypoactive bowel sounds but none tinkling or tympanitic.  Soft.  Not tender though she says it is she has no reaction to my exam i.e. no wincing guarding, rebound. Extremities: No CCE. Neuro/Psych: Cooperative, oriented x3.  No gross deficits.  Intake/Output from previous day: 10/04 0701 - 10/05 0700 In: 2332 [I.V.:2172.6; IV Piggyback:159.4] Out: -   Intake/Output this shift: Total I/O In: 3 [I.V.:3] Out: -   Lab Results: Recent Labs    10/09/18 0500  10/10/18 0754 10/10/18 1500 10/10/18 2144 10/11/18 1022  WBC 7.8  --  6.4  --   --  7.6  HGB 11.0*   < > 12.5 12.3 12.3 12.8  HCT 33.6*   < > 37.0 37.7 37.6 37.8  PLT 291  --  312  --   --  331   < > = values in this interval not displayed.   BMET Recent Labs    10/09/18 0500 10/10/18 0754 10/11/18 1022  NA 137 137 137  K 3.7 3.3* 3.5   CL 110 107 106  CO2 21* 20* 22  GLUCOSE 195* 184* 169*  BUN 7 <5* <5*  CREATININE 0.42* 0.40* 0.46  CALCIUM 7.8* 8.3* 8.7*   LFT Recent Labs    10/10/18 0754 10/11/18 1022  ALBUMIN 3.5 3.7   PT/INR No results for input(s): LABPROT, INR in the last 72 hours. Hepatitis Panel No results for input(s): HEPBSAG, HCVAB, HEPAIGM, HEPBIGM in the last 72 hours.  Studies/Results: No results found.   Scheduled Meds: . amLODipine  5 mg Oral Daily  . ARIPiprazole  20 mg Oral Daily  . atorvastatin  20 mg Oral q1800  . docusate sodium  100 mg Oral BID  . FLUoxetine  40 mg Oral QHS  . insulin aspart  0-9 Units Subcutaneous TID WC  . insulin aspart  15 Units Subcutaneous BID WC  . insulin glargine  50 Units Subcutaneous QHS  . lamoTRIgine  25 mg Oral BID  . metoCLOPramide (REGLAN) injection  10 mg   Intravenous Q6H  . pantoprazole (PROTONIX) IV  40 mg Intravenous BID  . potassium chloride  40 mEq Oral BID  . promethazine  12.5-25 mg Intravenous Q6H  . sodium chloride flush  3 mL Intravenous Q12H  . topiramate  25 mg Oral BID  . traZODone  300 mg Oral QHS   Continuous Infusions: . sodium chloride 100 mL/hr at 10/11/18 0556   PRN Meds:.acetaminophen **OR** acetaminophen, albuterol, hydrOXYzine, labetalol, magnesium hydroxide, traMADol   ASSESMENT:   *    Nausea, vomiting, hematemesis.  Suspect gastroparesis in setting of DKA. Chronic PPI at home.   Currently on Protonix 40 IV bid, every 6 hour IV Reglan  *     Chronic abdominal pain.  ?  Diabetic visceral neuropathy. Underwent EGD 04/2017 revealing retained gastric fluid.  Subsequent normal gastric emptying study. CTAP 10/08/2018 unrevealing as to cause of abdominal pain or nausea, vomiting.  *     Depression.  Overnight psych admission 10/02/2018 due to suicidal ideation.  On several psychiatric medications.  Hx psychogenic seizures.  Suspect depression may be leading to functional gastrointestinal issues.  *  IDDM.  Blood glucose  355 at admission   PLAN   *   k pad.   Dulcolax suppository.  CBC in the morning. Continue scheduled Reglan, IV Protonix Patient just got off the phone ordering soup and a sandwich.    Nancy [name]  10/11/2018, 2:03 PM Phone [phone]     Attending physician's note   I have taken an interval history, reviewed the chart and examined the patient. I agree with the Advanced Practitioner's note, impression and recommendations.    Seen this morning in rounds. "worst N/V/abdo pain ever in my life". Subjective complaints> objective findings. Still, I believe we should give her reasonable benefit of doubt.   Plan: -Continue IV Protonix -Continue Reglan -EGD in a.m. (will give her some reassurance even if it is negative) -Continue supportive treatment. -Do recommend psych consultation.   Carmell Austria, MD [facility] [phone].

## 2018-10-11 NOTE — Plan of Care (Signed)

## 2018-10-12 ENCOUNTER — Encounter (HOSPITAL_COMMUNITY): Payer: Self-pay | Admitting: *Deleted

## 2018-10-12 ENCOUNTER — Inpatient Hospital Stay (HOSPITAL_COMMUNITY): Payer: Medicaid Other | Admitting: Anesthesiology

## 2018-10-12 ENCOUNTER — Encounter (HOSPITAL_COMMUNITY)
Admission: EM | Disposition: A | Discharge status: Home / Self Care | Payer: Self-pay | Source: Home / Self Care | Attending: Internal Medicine

## 2018-10-12 DIAGNOSIS — K229 Disease of esophagus, unspecified: Secondary | ICD-10-CM

## 2018-10-12 DIAGNOSIS — K2970 Gastritis, unspecified, without bleeding: Secondary | ICD-10-CM

## 2018-10-12 HISTORY — PX: BIOPSY: SHX5522

## 2018-10-12 HISTORY — PX: ESOPHAGOGASTRODUODENOSCOPY (EGD) WITH PROPOFOL: SHX5813

## 2018-10-12 LAB — CBC WITH DIFFERENTIAL/PLATELET
Abs Immature Granulocytes: 0.03 10*3/uL (ref 0.00–0.07)
Basophils Absolute: 0 10*3/uL (ref 0.0–0.1)
Basophils Relative: 0 %
Eosinophils Absolute: 0.1 10*3/uL (ref 0.0–0.5)
Eosinophils Relative: 1 %
HCT: 37.2 % (ref 36.0–46.0)
Hemoglobin: 12.6 g/dL (ref 12.0–15.0)
Immature Granulocytes: 0 %
Lymphocytes Relative: 33 %
Lymphs Abs: 3 10*3/uL (ref 0.7–4.0)
MCH: 28.8 pg (ref 26.0–34.0)
MCHC: 33.9 g/dL (ref 30.0–36.0)
MCV: 85.1 fL (ref 80.0–100.0)
Monocytes Absolute: 1.1 10*3/uL — ABNORMAL HIGH (ref 0.1–1.0)
Monocytes Relative: 13 %
Neutro Abs: 4.8 10*3/uL (ref 1.7–7.7)
Neutrophils Relative %: 53 %
Platelets: 351 10*3/uL (ref 150–400)
RBC: 4.37 MIL/uL (ref 3.87–5.11)
RDW: 13.4 % (ref 11.5–15.5)
WBC: 9 10*3/uL (ref 4.0–10.5)
nRBC: 0 % (ref 0.0–0.2)

## 2018-10-12 LAB — GLUCOSE, CAPILLARY
Glucose-Capillary: 149 mg/dL — ABNORMAL HIGH (ref 70–99)
Glucose-Capillary: 117 mg/dL — ABNORMAL HIGH (ref 70–99)
Glucose-Capillary: 153 mg/dL — ABNORMAL HIGH (ref 70–99)
Glucose-Capillary: 203 mg/dL — ABNORMAL HIGH (ref 70–99)

## 2018-10-12 LAB — RENAL FUNCTION PANEL
Albumin: 3.3 g/dL — ABNORMAL LOW (ref 3.5–5.0)
Anion gap: 7 (ref 5–15)
BUN: <5 mg/dL — ABNORMAL LOW (ref 6–20)
CO2: 22 mmol/L (ref 22–32)
Calcium: 8.5 mg/dL — ABNORMAL LOW (ref 8.9–10.3)
Chloride: 107 mmol/L (ref 98–111)
Creatinine, Ser: 0.59 mg/dL (ref 0.44–1.00)
GFR calc Af Amer: >60 mL/min (ref >60)
GFR calc non Af Amer: >60 mL/min (ref >60)
Glucose, Bld: 184 mg/dL — ABNORMAL HIGH (ref 70–99)
Phosphorus: 2.5 mg/dL (ref 2.5–4.6)
Potassium: 4 mmol/L (ref 3.5–5.1)
Sodium: 136 mmol/L (ref 135–145)

## 2018-10-12 LAB — MAGNESIUM: Magnesium: 2.1 mg/dL (ref 1.7–2.4)

## 2018-10-12 SURGERY — ESOPHAGOGASTRODUODENOSCOPY (EGD) WITH PROPOFOL
Anesthesia: Monitor Anesthesia Care

## 2018-10-12 MED ORDER — PROPRANOLOL HCL 10 MG PO TABS
10.0000 mg | ORAL_TABLET | Freq: Three times a day (TID) | ORAL | Status: DC
  Administered 2018-10-12 – 2018-10-15 (×7): 10 mg via ORAL
  Filled 2018-10-12 (×9): qty 1

## 2018-10-12 MED ORDER — PROPOFOL 10 MG/ML IV BOLUS
INTRAVENOUS | Status: DC | PRN
  Administered 2018-10-12: 30 mg via INTRAVENOUS
  Administered 2018-10-12: 20 mg via INTRAVENOUS
  Administered 2018-10-12: 30 mg via INTRAVENOUS

## 2018-10-12 MED ORDER — FLUCONAZOLE 100 MG PO TABS
100.0000 mg | ORAL_TABLET | Freq: Every day | ORAL | Status: DC
  Administered 2018-10-13 – 2018-10-15 (×3): 100 mg via ORAL
  Filled 2018-10-12 (×3): qty 1

## 2018-10-12 MED ORDER — PROPOFOL 500 MG/50ML IV EMUL
INTRAVENOUS | Status: DC | PRN
  Administered 2018-10-12: 125 ug/kg/min via INTRAVENOUS

## 2018-10-12 MED ORDER — LIDOCAINE 2% (20 MG/ML) 5 ML SYRINGE
INTRAMUSCULAR | Status: DC | PRN
  Administered 2018-10-12: 40 mg via INTRAVENOUS

## 2018-10-12 MED ORDER — LACTATED RINGERS IV SOLN
INTRAVENOUS | Status: DC
  Administered 2018-10-12: 1000 mL via INTRAVENOUS

## 2018-10-12 MED ORDER — FLUCONAZOLE 200 MG PO TABS
200.0000 mg | ORAL_TABLET | Freq: Once | ORAL | Status: AC
  Administered 2018-10-12: 200 mg via ORAL
  Filled 2018-10-12: qty 1
  Filled 2018-10-12: qty 2

## 2018-10-12 SURGICAL SUPPLY — 15 items

## 2018-10-12 NOTE — Progress Notes (Signed)
MEWS/VS Documentation      10/12/2018 0048 10/12/2018 0407 10/12/2018 0426 10/12/2018 0700   MEWS Score:  2  2  1  2    MEWS Score Color:  Yellow  Yellow  Green  Yellow   Resp:  18  -  18  -   Pulse:  (!) 120  -  (!) 104  -   BP:  133/88  -  (!) 147/107  -   Temp:  98.5 F (36.9 C)  -  99 F (37.2 C)  -   Level of Consciousness:  -  Alert  -  -     No acute change. Implementing VS protocol before patient goes down for procedure.

## 2018-10-12 NOTE — Progress Notes (Signed)
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 Reportedly, the patient was found down on the floor by family and was unresponsive. She does not recall how she ended up on the floor, but states that she has been having seizures. However, appears to have been taken off Keppra because it was "too strong". Complains of throwing up blood and her "stomach has been all messed up". Episodes of emesis werenoted to be bright red and coffee-ground discusses previous evaluations by [facility] where she notes being worked up and told that she did not have gastroparesis. Associated symptoms include constipation. Patient still reports being able to pass flatus.She had been taking her Lantus 52 units at night, but reports that she had not been taking the Humalog with meals because she was unable to keep any significant amount of food down.  10/3: c/o hematemesis; ongoing N/V 10/4: c/o N/V ON, able to keep down some chicken broth she says; ab pain not mentioned this AM; reports constipation 10/5: reports syncopal episode this AM, continued N/V 10/6: To EGD today. Reports ab pain per nursing.   Assessment & Plan:   Principal Problem:   MDD (major depressive disorder), recurrent severe, without psychosis (HCC) Active Problems:   Obesity   Nausea and vomiting   Generalized abdominal pain   Chronic abdominal pain   Conversion disorder with attacks or seizures, acute episode, with psychological stressor   DKA, type 1 (HCC)   Hypertensive urgency   DKA: Acute on chronic  - Patient presents with elevated blood glucose  of 355 with CO2 21 and anion gap 16.  - fluids, novolog 15 units BID WM, lantus 50units daily  Abdominal pain: Acute on chronic.  - CT scan of the abdomen and pelvis did not show any acute abnormalities and noted a small cystic lesion in the right wall of the vagina thought to be a [employer] fast streamer ductal cysts unchanged. - chronic pain; PRN pain control. - check lipase     - GI onboard, appreciate assistance     - EGD: mild candida and gastritis; diflucan started  Nausea, vomiting/hematemesis: - followed by [facility] - has been on "every" nausea medicine per her report; and nothing works -GI onboard; appreciate assistance - IV reglan, protonix - Hgb is stable  Hypertensive urgency: Acute.  - Blood pressures elevated up to 161/106 on admission.  - Patient not on blood pressure medications at home. - Labetalol IV as needed - amlodipine 5 mg PO; can increase to 10mg .  Sinus tachycardia: Acute on chronic.  - Heart rates into the 120s on admission.  - Patient previously was on propranolol in the past. - fluids, monitor - check TSH/FT4: TSH suppressed, FT4 is normal; check FT3     - start propranolol 10 TID      - need follow up with endocrine outpt  Anxiety/depression:  - Patient follows in outpatient setting with [facility].  - She was seen by PCP 3 days ago, and Lexapro was recommended to be discontinued by PCP and Seroquel was stopped. - Continue Abilify, Prozac, Lamictal, and hydroxyzine as needed - Continue outpatient follow-up with [facility] on October 19  Psychogenic seizures:  -  Evaluated and previously noted not to have epileptiform seizures.  - Seizure precautions  Obesity:  - BMI 35.08 kg/m - diet, exercise counseled  Constipation - colace, PRN MoM  Syncopal episode?     - per nursing, pt reported that she was going to pass out this AM; they went  to check on her and she was on the floor     - no head injury reported.      - check echo     - EKG with sinus tach     - orthostatics ok  Starting diflucan for esophageal candida. EGD w/ mild gastritis. Continue fluids, PO as tolerated. Resume propanolol. Needs follow up with endocrine.  DVT prophylaxis:lovenox Code Status:FULL Disposition Plan:TBD  Consultants:  GI  Psyc  ROS:  Reports N, V. Denies CP, dyspnea Remainder 10-pt ROS is negative for all not previously mentioned.  Subjective: "That's the same thing he said."  Objective: Vitals:   10/12/18 1053 10/12/18 1055 10/12/18 1127 10/12/18 1158  BP: (!) 165/114 (!) 165/114 (!) 139/103   Pulse: (!) 117 (!) 116 (!) 121   Resp: 19 (!) 30 (!) 22 (!) 25  Temp:   99 F (37.2 C)   TempSrc:   Oral   SpO2: 100% 100% 100%   Weight:      Height:        Intake/Output Summary (Last 24 hours) at 10/12/2018 1238 Last data filed at 10/12/2018 1039 Gross per 24 hour  Intake 1400 ml  Output -  Net 1400 ml   Filed Weights   10/08/18 1052  Weight: 89.8 kg    Examination:  General: 18 y.o. female resting in bed in NAD Eyes: PERRL, normal sclera ENMT: Nares patent w/o discharge, orophaynx clear, dentition normal, ears w/o discharge/lesions/ulcers Cardiovascular: tachy, +S1, S2, no m/g/r, equal pulses throughout Respiratory: CTABL, no w/r/r, normal WOB GI: BS+, NDNT, no masses noted, no organomegaly noted MSK: No e/c/c, no tremor Neuro: A&O x 3, no focal deficits   Data Reviewed: I have personally reviewed following labs and imaging studies.  CBC: Recent Labs  Lab 10/08/18 1104 10/09/18 0500  10/10/18 0754 10/10/18 1500 10/10/18 2144 10/11/18 1022 10/12/18 0238  WBC 9.7 7.8  --  6.4  --   --  7.6 9.0  NEUTROABS  --   --   --  4.4  --   --  5.0 4.8  HGB 13.7 11.0*   < > 12.5 12.3 12.3 12.8 12.6  HCT 40.4 33.6*   < > 37.0 37.7 37.6 37.8 37.2  MCV 86.0 88.4  --  86.4  --   --  85.1 85.1  PLT 322 291  --   312  --   --  331 351   < > = values in this interval not displayed.   Basic Metabolic Panel: Recent Labs  Lab 10/08/18 1529 10/09/18 0500 10/10/18 0754 10/11/18 1022 10/12/18 0238  NA 134* 137 137 137 136  K 3.5 3.7 3.3* 3.5 4.0  CL 102 110 107 106 107  CO2 20* 21* 20* 22 22  GLUCOSE 282* 195* 184* 169* 184*  BUN 10 7 <5* <5* <5*  CREATININE 0.61 0.42* 0.40* 0.46 0.59  CALCIUM 8.6* 7.8* 8.3* 8.7* 8.5*  MG  --  2.2 2.1 2.0 2.1  PHOS  --   --  1.9* 2.4* 2.5   GFR: Estimated Creatinine Clearance: 119.4 mL/min (by C-G formula based on SCr of 0.59 mg/dL). Liver Function Tests: Recent Labs  Lab 10/10/18 0754 10/11/18 1022 10/12/18 0238  ALBUMIN 3.5 3.7 3.3*   Recent Labs  Lab 10/08/18 1104 10/11/18 1609  LIPASE 20 22   No results for input(s): AMMONIA in the last 168 hours. Coagulation Profile: No results for input(s): INR, PROTIME in the last 168 hours. Cardiac Enzymes: No results for input(s): CKTOTAL, CKMB, CKMBINDEX, TROPONINI in the last 168 hours. BNP (last 3 results) No results for input(s): PROBNP in the last 8760 hours. HbA1C: No results for input(s): HGBA1C in the last 72 hours. CBG: Recent Labs  Lab 10/11/18 1143 10/11/18 1624 10/11/18 2124 10/12/18 0756 10/12/18 1202  GLUCAP 161* 158* 117* 149* 117*   Lipid Profile: No results for input(s): CHOL, HDL, LDLCALC, TRIG, CHOLHDL, LDLDIRECT in the last 72 hours. Thyroid Function Tests: Recent Labs    10/11/18 1609  TSH 0.199*  FREET4 1.07   Anemia Panel: No results for input(s): VITAMINB12, FOLATE, FERRITIN, TIBC, IRON, RETICCTPCT in the last 72 hours. Sepsis Labs: No results for input(s): PROCALCITON, LATICACIDVEN in the last 168 hours.  Recent Results (from the past 240 hour(s))  MRSA PCR Screening     Status: None   Collection Time: 10/10/18  8:51 AM   Specimen: Nasal Mucosa; Nasopharyngeal  Result Value Ref Range Status   MRSA by PCR NEGATIVE NEGATIVE Final    Comment:        The  GeneXpert MRSA Assay (FDA approved for NASAL specimens only), is one component of a comprehensive MRSA colonization surveillance program. It is not intended to diagnose MRSA infection nor to guide or monitor treatment for MRSA infections. Performed at [facility], [address] [place], [place] [address]   SARS CORONAVIRUS 2 (TAT 6-24 HRS) Nasopharyngeal Nasopharyngeal Swab     Status: None   Collection Time: 10/11/18  3:18 PM   Specimen: Nasopharyngeal Swab  Result Value Ref Range Status   SARS Coronavirus 2 NEGATIVE NEGATIVE Final    Comment: (NOTE) SARS-CoV-2 target nucleic acids are NOT DETECTED. The SARS-CoV-2 RNA is generally detectable in upper and lower respiratory specimens during the acute phase of infection. Negative results do not preclude SARS-CoV-2 infection, do not rule out co-infections with other pathogens, and should not be used as the sole basis for treatment or other patient management decisions. Negative results must be combined with clinical observations, patient history, and epidemiological information. The expected result is Negative. Fact Sheet for Patients: HairSlick.nohttps://www.fda.gov/media/138098/download Fact Sheet for Healthcare Providers: quierodirigir.comhttps://www.fda.gov/media/138095/download This test is not yet approved or cleared by the [place] FDA and  has been authorized for detection and/or diagnosis of SARS-CoV-2 by FDA under an Emergency Use Authorization (EUA). This EUA will remain  in effect (meaning this test can be used) for the duration of the COVID-19 declaration under Section 56 4(b)(1) of the Act, 21 U.S.C. section 360bbb-3(b)(1), unless the authorization is terminated or revoked sooner. Performed at [facility], [address] [place], [place] [address]       Radiology Studies: No results found.   Scheduled Meds: . amLODipine  5 mg Oral Daily  . ARIPiprazole  5 mg Oral Daily  . atorvastatin  20 mg Oral q1800  .  bisacodyl  10 mg Rectal Daily  . docusate sodium  100 mg Oral BID  . [START ON 10/13/2018] fluconazole  100 mg Oral Daily  . FLUoxetine  40 mg Oral QHS  . insulin aspart  0-9 Units Subcutaneous TID WC  . insulin aspart  15 Units Subcutaneous BID  WC  . insulin glargine  50 Units Subcutaneous QHS  . lamoTRIgine  25 mg Oral BID  . metoCLOPramide (REGLAN) injection  10 mg Intravenous Q6H  . pantoprazole (PROTONIX) IV  40 mg Intravenous BID  . potassium chloride  40 mEq Oral BID  . promethazine  12.5-25 mg Intravenous Q6H  . QUEtiapine  50 mg Oral QHS  . sodium chloride flush  3 mL Intravenous Q12H  . topiramate  25 mg Oral BID   Continuous Infusions: . sodium chloride 100 mL/hr at 10/12/18 1145     LOS: 3 days    Time spent: 25 minutes spent in the coordination of care today.    Teddy Spike, DO [facility] Pager [phone]  If 7PM-7AM, please contact night-coverage [email or web address] Password [email or web address] 10/12/2018, 12:38 PM

## 2018-10-12 NOTE — Interval H&P Note (Signed)
History and Physical Interval Note:  10/12/2018 10:20 AM  Nancy [name]  has presented today for surgery, with the diagnosis of Nausea, vomiting, scant hematemesis.  Acute on chronic.  Chronic abdominal pain..  The various methods of treatment have been discussed with the patient and family. After consideration of risks, benefits and other options for treatment, the patient has consented to  Procedure(s): ESOPHAGOGASTRODUODENOSCOPY (EGD) WITH PROPOFOL (N/A) as a surgical intervention.  The patient's history has been reviewed, patient examined, no change in status, stable for surgery.  I have reviewed the patient's chart and labs.  Questions were answered to the patient's satisfaction.     Jackquline Denmark

## 2018-10-12 NOTE — Op Note (Signed)
[facility] Patient Name: Nancy [name] Procedure Date : 10/12/2018 MRN: [id number] Attending MD: Lynann Bologna , MD Date of Birth: 21-Sep-2000 CSN: [id number] Age: 18 Admit Type: Inpatient Procedure:                Upper GI endoscopy Indications:              N/V with neg CT abdo/pelvis 10/08/2018 Providers:                Lynann Bologna, MD, Vicki Mallet, RN, Beryle Beams,                            Technician, Merril Abbe CRNA, CRNA Referring MD:              Medicines:                Monitored Anesthesia Care Complications:            No immediate complications. Estimated Blood Loss:     Estimated blood loss: none. Procedure:                Pre-Anesthesia Assessment:                           - Prior to the procedure, a History and Physical                            was performed, and patient medications and                            allergies were reviewed. The patient's tolerance of                            previous anesthesia was also reviewed. The risks                            and benefits of the procedure and the sedation                            options and risks were discussed with the patient.                            All questions were answered, and informed consent                            was obtained. Prior Anticoagulants: The patient has                            taken no previous anticoagulant or antiplatelet                            agents. ASA Grade Assessment: II - A patient with                            mild systemic disease. After reviewing the risks  and benefits, the patient was deemed in                            satisfactory condition to undergo the procedure.                           After obtaining informed consent, the endoscope was                            passed under direct vision. Throughout the                            procedure, the patient's blood pressure, pulse, and           oxygen saturations were monitored continuously. The                            GIF-H190 ([id number]) Olympus gastroscope was                            introduced through the mouth, and advanced to the                            second part of duodenum. The upper GI endoscopy was                            accomplished without difficulty. The patient                            tolerated the procedure well. Scope In: Scope Out: Findings:      Few white plaques were found at the gastroesophageal junction, 36 cm       from Incisors. Biopsies were taken with a cold forceps for histology.      Some retained food in the stomach limiting examination. Mild       inflammation characterized by erythema was found in the gastric antrum.       Biopsies were taken with a cold forceps for histology.      The examined duodenum was normal. Biopsies for histology were taken with       a cold forceps for evaluation of celiac disease. Impression:               -Mild Candida esophagitis.                           -Mild gastritis.                           -Some retained food without mechanical outlet                            obstruction (likely due to recent p.o. intake) Recommendation:           - Return patient to hospital ward for ongoing care.                           - Resume previous diet.                           -  Continue Protonix 40 mg p.o. once a day.                           - Await pathology results.                           - Avoid ibuprofen, naproxen, or other non-steroidal                            anti-inflammatory drugs.                           - Diflucan (fluconazole) 200 mg p.o. x1 followed by                            100 mg PO daily for 2 weeks.                           - FU in GI clinic in 6-8 weeks. If still with                            problems, would consider repeat GES as outpatient.                            Of note that her GES was normal 05/2017.                            - Will sign off for now. Procedure Code(s):        --- Professional ---                           [phone], Esophagogastroduodenoscopy, flexible,                            transoral; with biopsy, single or multiple Diagnosis Code(s):        --- Professional ---                           K22.9, Disease of esophagus, unspecified                           K29.70, Gastritis, unspecified, without bleeding                           R11.15, Cyclical vomiting syndrome unrelated to                            migraine CPT copyright 2019 American Medical Association. All rights reserved. The codes documented in this report are preliminary and upon coder review may  be revised to meet current compliance requirements. Lynann Bologna, MD 10/12/2018 10:46:09 AM This report has been signed electronically. Number of Addenda: 0

## 2018-10-12 NOTE — Transfer of Care (Signed)
Immediate Anesthesia Transfer of Care Note  Patient: [name]  Procedure(s) Performed: ESOPHAGOGASTRODUODENOSCOPY (EGD) WITH PROPOFOL (N/A )  Patient Location: Endoscopy Unit  Anesthesia Type:MAC  Level of Consciousness: drowsy and patient cooperative  Airway & Oxygen Therapy: Patient Spontanous Breathing and Patient connected to nasal cannula oxygen  Post-op Assessment: Report given to RN and Post -op Vital signs reviewed and stable  Post vital signs: Reviewed and stable  Last Vitals:  Vitals Value Taken Time  BP 149/101 10/12/18 1043  Temp 37 C 10/12/18 1042  Pulse 120 10/12/18 1043  Resp 25 10/12/18 1043  SpO2 100 % 10/12/18 1043  Vitals shown include unvalidated device data.  Last Pain:  Vitals:   10/12/18 1042  TempSrc: Temporal  PainSc: 0-No pain      Patients Stated Pain Goal: 2 (97/35/32 9924)  Complications: No apparent anesthesia complications

## 2018-10-12 NOTE — Anesthesia Preprocedure Evaluation (Signed)
Anesthesia Evaluation  Patient identified by MRN, date of birth, ID band Patient awake    Reviewed: Allergy & Precautions, NPO status , Patient's Chart, lab work & pertinent test results  Airway Mallampati: I  TM Distance: >3 FB Neck ROM: Full    Dental   Pulmonary    Pulmonary exam normal        Cardiovascular hypertension, Pt. on medications Normal cardiovascular exam     Neuro/Psych    GI/Hepatic   Endo/Other  diabetes, Type 2, Insulin Dependent  Renal/GU      Musculoskeletal   Abdominal   Peds  Hematology   Anesthesia Other Findings   Reproductive/Obstetrics                             Anesthesia Physical Anesthesia Plan  ASA: II  Anesthesia Plan: MAC   Post-op Pain Management:    Induction: Intravenous  PONV Risk Score and Plan: 2 and Treatment may vary due to age or medical condition and Ondansetron  Airway Management Planned: Nasal Cannula  Additional Equipment:   Intra-op Plan:   Post-operative Plan:   Informed Consent: I have reviewed the patients History and Physical, chart, labs and discussed the procedure including the risks, benefits and alternatives for the proposed anesthesia with the patient or authorized representative who has indicated his/her understanding and acceptance.       Plan Discussed with: CRNA and Surgeon  Anesthesia Plan Comments:         Anesthesia Quick Evaluation

## 2018-10-12 NOTE — Anesthesia Postprocedure Evaluation (Signed)
Anesthesia Post Note  Patient: Nancy [name]  Procedure(s) Performed: ESOPHAGOGASTRODUODENOSCOPY (EGD) WITH PROPOFOL (N/A )     Patient location during evaluation: PACU Anesthesia Type: MAC Level of consciousness: awake and alert Pain management: pain level controlled Vital Signs Assessment: post-procedure vital signs reviewed and stable Respiratory status: spontaneous breathing, nonlabored ventilation, respiratory function stable and patient connected to nasal cannula oxygen Cardiovascular status: stable and blood pressure returned to baseline Postop Assessment: no apparent nausea or vomiting Anesthetic complications: no    Last Vitals:  Vitals:   10/12/18 1200 10/12/18 1355  BP:  135/88  Pulse:  (!) 123  Resp: 18   Temp:  37.2 C  SpO2:      Last Pain:  Vitals:   10/12/18 1355  TempSrc: Oral  PainSc:                  Gaylia Kassel DAVID

## 2018-10-13 ENCOUNTER — Other Ambulatory Visit: Payer: Self-pay

## 2018-10-13 ENCOUNTER — Other Ambulatory Visit: Payer: Self-pay | Admitting: Physician Assistant

## 2018-10-13 ENCOUNTER — Encounter (HOSPITAL_COMMUNITY)
Admission: EM | Disposition: A | Discharge status: Home / Self Care | Payer: Self-pay | Source: Home / Self Care | Attending: Internal Medicine

## 2018-10-13 ENCOUNTER — Encounter (HOSPITAL_COMMUNITY): Payer: Self-pay | Admitting: Gastroenterology

## 2018-10-13 DIAGNOSIS — R9431 Abnormal electrocardiogram [ECG] [EKG]: Secondary | ICD-10-CM

## 2018-10-13 DIAGNOSIS — E1310 Other specified diabetes mellitus with ketoacidosis without coma: Secondary | ICD-10-CM

## 2018-10-13 DIAGNOSIS — R079 Chest pain, unspecified: Secondary | ICD-10-CM

## 2018-10-13 HISTORY — PX: LEFT HEART CATH AND CORONARY ANGIOGRAPHY: CATH118249

## 2018-10-13 LAB — RENAL FUNCTION PANEL
Albumin: 3.3 g/dL — ABNORMAL LOW (ref 3.5–5.0)
Anion gap: 7 (ref 5–15)
BUN: 10 mg/dL (ref 6–20)
CO2: 22 mmol/L (ref 22–32)
Calcium: 8.4 mg/dL — ABNORMAL LOW (ref 8.9–10.3)
Chloride: 105 mmol/L (ref 98–111)
Creatinine, Ser: 0.66 mg/dL (ref 0.44–1.00)
GFR calc Af Amer: >60 mL/min (ref >60)
GFR calc non Af Amer: >60 mL/min (ref >60)
Glucose, Bld: 168 mg/dL — ABNORMAL HIGH (ref 70–99)
Phosphorus: 3.3 mg/dL (ref 2.5–4.6)
Potassium: 4.1 mmol/L (ref 3.5–5.1)
Sodium: 134 mmol/L — ABNORMAL LOW (ref 135–145)

## 2018-10-13 LAB — CBC WITH DIFFERENTIAL/PLATELET
Abs Immature Granulocytes: 0.05 10*3/uL (ref 0.00–0.07)
Basophils Absolute: 0.1 10*3/uL (ref 0.0–0.1)
Basophils Relative: 0 %
Eosinophils Absolute: 0.1 10*3/uL (ref 0.0–0.5)
Eosinophils Relative: 1 %
HCT: 37.3 % (ref 36.0–46.0)
Hemoglobin: 12.1 g/dL (ref 12.0–15.0)
Immature Granulocytes: 0 %
Lymphocytes Relative: 32 %
Lymphs Abs: 4.1 10*3/uL — ABNORMAL HIGH (ref 0.7–4.0)
MCH: 28.5 pg (ref 26.0–34.0)
MCHC: 32.4 g/dL (ref 30.0–36.0)
MCV: 88 fL (ref 80.0–100.0)
Monocytes Absolute: 2 10*3/uL — ABNORMAL HIGH (ref 0.1–1.0)
Monocytes Relative: 15 %
Neutro Abs: 6.6 10*3/uL (ref 1.7–7.7)
Neutrophils Relative %: 52 %
Platelets: 367 10*3/uL (ref 150–400)
RBC: 4.24 MIL/uL (ref 3.87–5.11)
RDW: 14 % (ref 11.5–15.5)
WBC: 12.8 10*3/uL — ABNORMAL HIGH (ref 4.0–10.5)
nRBC: 0 % (ref 0.0–0.2)

## 2018-10-13 LAB — POCT ACTIVATED CLOTTING TIME: Activated Clotting Time: 175 seconds

## 2018-10-13 LAB — GLUCOSE, CAPILLARY
Glucose-Capillary: 146 mg/dL — ABNORMAL HIGH (ref 70–99)
Glucose-Capillary: 196 mg/dL — ABNORMAL HIGH (ref 70–99)
Glucose-Capillary: 59 mg/dL — ABNORMAL LOW (ref 70–99)
Glucose-Capillary: 122 mg/dL — ABNORMAL HIGH (ref 70–99)

## 2018-10-13 LAB — TROPONIN I (HIGH SENSITIVITY): Troponin I (High Sensitivity): <2 ng/L (ref <18)

## 2018-10-13 LAB — MAGNESIUM: Magnesium: 2 mg/dL (ref 1.7–2.4)

## 2018-10-13 LAB — SURGICAL PATHOLOGY

## 2018-10-13 LAB — T3, FREE: T3, Free: 2.5 pg/mL (ref 2.0–4.4)

## 2018-10-13 SURGERY — LEFT HEART CATH AND CORONARY ANGIOGRAPHY
Anesthesia: LOCAL

## 2018-10-13 MED ORDER — MIDAZOLAM HCL 2 MG/2ML IJ SOLN
INTRAMUSCULAR | Status: AC
  Filled 2018-10-13: qty 2

## 2018-10-13 MED ORDER — NITROGLYCERIN 0.4 MG SL SUBL
SUBLINGUAL_TABLET | SUBLINGUAL | Status: AC
  Filled 2018-10-13: qty 1

## 2018-10-13 MED ORDER — LIDOCAINE HCL (PF) 1 % IJ SOLN
INTRAMUSCULAR | Status: AC
  Filled 2018-10-13: qty 30

## 2018-10-13 MED ORDER — FENTANYL CITRATE (PF) 100 MCG/2ML IJ SOLN
INTRAMUSCULAR | Status: AC
  Filled 2018-10-13: qty 2

## 2018-10-13 MED ORDER — SODIUM CHLORIDE 0.9 % IV SOLN: INTRAVENOUS | Status: AC

## 2018-10-13 MED ORDER — FENTANYL CITRATE (PF) 100 MCG/2ML IJ SOLN
INTRAMUSCULAR | Status: DC | PRN
  Administered 2018-10-13: 25 ug via INTRAVENOUS

## 2018-10-13 MED ORDER — HEPARIN (PORCINE) IN NACL 1000-0.9 UT/500ML-% IV SOLN
INTRAVENOUS | Status: DC | PRN
  Administered 2018-10-13 (×2): 500 mL

## 2018-10-13 MED ORDER — IOHEXOL 350 MG/ML SOLN
INTRAVENOUS | Status: DC | PRN
  Administered 2018-10-13: 09:00:00 105 mL

## 2018-10-13 MED ORDER — HYDRALAZINE HCL 20 MG/ML IJ SOLN: 10.0000 mg | INTRAMUSCULAR | Status: AC | PRN

## 2018-10-13 MED ORDER — HEPARIN SODIUM (PORCINE) 1000 UNIT/ML IJ SOLN
INTRAMUSCULAR | Status: DC | PRN
  Administered 2018-10-13: 4500 [IU] via INTRAVENOUS

## 2018-10-13 MED ORDER — SODIUM CHLORIDE 0.9 % IV SOLN: 250.0000 mL | INTRAVENOUS | Status: DC | PRN

## 2018-10-13 MED ORDER — HEPARIN (PORCINE) IN NACL 1000-0.9 UT/500ML-% IV SOLN
INTRAVENOUS | Status: AC
  Filled 2018-10-13: qty 1000

## 2018-10-13 MED ORDER — SODIUM CHLORIDE 0.9% FLUSH: 3.0000 mL | Freq: Two times a day (BID) | INTRAVENOUS | Status: DC

## 2018-10-13 MED ORDER — LIDOCAINE HCL (PF) 1 % IJ SOLN
INTRAMUSCULAR | Status: DC | PRN
  Administered 2018-10-13: 2 mL
  Administered 2018-10-13: 10 mL
  Administered 2018-10-13: 15 mL

## 2018-10-13 MED ORDER — HEPARIN SODIUM (PORCINE) 1000 UNIT/ML IJ SOLN
INTRAMUSCULAR | Status: AC
  Filled 2018-10-13: qty 1

## 2018-10-13 MED ORDER — MIDAZOLAM HCL 2 MG/2ML IJ SOLN
INTRAMUSCULAR | Status: DC | PRN
  Administered 2018-10-13: 1 mg via INTRAVENOUS

## 2018-10-13 MED ORDER — ALUM & MAG HYDROXIDE-SIMETH 200-200-20 MG/5ML PO SUSP
30.0000 mL | Freq: Once | ORAL | Status: DC
  Filled 2018-10-13: qty 30

## 2018-10-13 MED ORDER — SODIUM CHLORIDE 0.9% FLUSH: 3.0000 mL | INTRAVENOUS | Status: DC | PRN

## 2018-10-13 MED ORDER — VERAPAMIL HCL 2.5 MG/ML IV SOLN
INTRAVENOUS | Status: DC | PRN
  Administered 2018-10-13: 08:00:00 10 mL via INTRA_ARTERIAL

## 2018-10-13 MED ORDER — VERAPAMIL HCL 2.5 MG/ML IV SOLN
INTRAVENOUS | Status: AC
  Filled 2018-10-13: qty 2

## 2018-10-13 MED ORDER — MAGNESIUM CITRATE PO SOLN
1.0000 | Freq: Once | ORAL | Status: AC
  Administered 2018-10-13: 1 via ORAL
  Filled 2018-10-13: qty 296

## 2018-10-13 SURGICAL SUPPLY — 18 items
CATH 5FR JL3.5 JR4 ANG PIG MP (CATHETERS) ×2 IMPLANT
CATH INFINITI 5FR JL4 (CATHETERS) ×2 IMPLANT
CATH INFINITI MULTIPACK ANG 4F (CATHETERS) ×2 IMPLANT
DEVICE RAD COMP TR BAND LRG (VASCULAR PRODUCTS) ×2 IMPLANT
GLIDESHEATH SLEND SS 6F .021 (SHEATH) ×2 IMPLANT
GUIDEWIRE INQWIRE 1.5J.035X260 (WIRE) ×1 IMPLANT
INQWIRE 1.5J .035X260CM (WIRE) ×2
KIT ENCORE 26 ADVANTAGE (KITS) ×2 IMPLANT
KIT HEART LEFT (KITS) ×2 IMPLANT
KIT MICROPUNCTURE NIT STIFF (SHEATH) ×2 IMPLANT
PACK CARDIAC CATHETERIZATION (CUSTOM PROCEDURE TRAY) ×2 IMPLANT
SHEATH PINNACLE 4F 10CM (SHEATH) ×2 IMPLANT
SHEATH PINNACLE 5F 10CM (SHEATH) ×2 IMPLANT
SHEATH PROBE COVER 6X72 (BAG) ×2 IMPLANT
TRANSDUCER W/STOPCOCK (MISCELLANEOUS) ×2 IMPLANT
TUBING CIL FLEX 10 FLL-RA (TUBING) ×2 IMPLANT
WIRE EMERALD 3MM-J .035X150CM (WIRE) ×2 IMPLANT
WIRE HI TORQ VERSACORE-J 145CM (WIRE) ×2 IMPLANT

## 2018-10-13 NOTE — Plan of Care (Signed)
  Problem: Education: Goal: Knowledge of General Education information will improve Description: Including pain rating scale, medication(s)/side effects and non-pharmacologic comfort measures Outcome: Progressing   Problem: Clinical Measurements: Goal: Ability to maintain clinical measurements within normal limits will improve Outcome: Progressing Goal: Will remain free from infection Outcome: Progressing Goal: Diagnostic test results will improve Outcome: Progressing Goal: Respiratory complications will improve Outcome: Progressing   Problem: Activity: Goal: Risk for activity intolerance will decrease Outcome: Progressing   Problem: Nutrition: Goal: Adequate nutrition will be maintained Outcome: Progressing   Problem: Coping: Goal: Level of anxiety will decrease Outcome: Progressing   Problem: Pain Managment: Goal: General experience of comfort will improve Outcome: Progressing   Problem: Safety: Goal: Ability to remain free from injury will improve Outcome: Progressing   Problem: Skin Integrity: Goal: Risk for impaired skin integrity will decrease Outcome: Progressing   Problem: Elimination: Goal: Will not experience complications related to bowel motility Outcome: Not Progressing   Problem: Health Behavior/Discharge Planning: Goal: Ability to manage health-related needs will improve Outcome: Not Met (add Reason)   Problem: Clinical Measurements: Goal: Cardiovascular complication will be avoided Outcome: Not Met (add Reason)   Problem: Elimination: Goal: Will not experience complications related to urinary retention Outcome: Not Applicable

## 2018-10-13 NOTE — Progress Notes (Signed)
MEWS/VS Documentation      10/13/2018 1235 10/13/2018 1335 10/13/2018 1435 10/13/2018 1502   MEWS Score:  3  2  1  2    MEWS Score Color:  Yellow  Yellow  Green  Yellow   Resp:  (!) 22  20  20  18    Pulse:  (!) 112  (!) 113  (!) 103  (!) 115   BP:  (!) 137/93  115/79  (!) 138/92  (!) 133/94   O2 Device:  -  -  -  Room Air    Pt just returned from holding where she was observed after having heart catherization this AM. Pt experienced chest pain and had abnormal EKG, was evaluated by Cardiac Specialist.

## 2018-10-13 NOTE — Consult Note (Addendum)
Cardiology Consultation:   Patient ID: LENETTE TERLIZZI MRN: [id number]; DOB: [birth year]  Admit date: 10/08/2018 Date of Consult: 10/13/2018  Primary Care Provider: Claiborne Rigg, NP Primary Cardiologist: Dietrich Pates, MD  Primary Electrophysiologist:  None   Consulting: Dr. Rhona Leavens  Patient Profile:   NUSRAT LINE is a 18 y.o. female with a hx of DM, DKA, chronic abdominal pain, prior syncope, depression, anxiety and seizures who is being seen today for the evaluation of chest pain and abnormal EKG. Consulted by Dr. Rhona Leavens.   History of Present Illness:   Ms. Brookes with a history of IDDM, DKA, chronic abdominal pain, prior syncope, depression, anxiety and seizures who is being seen today for the evaluation of chest pain and abnormal EKG. She was admitted after a syncopal episode and was found to have DKA. She also describes hematemesis. GI workup with upper EGD yesterday with gastritis. This am she began having severe chest pain. Rapid response nursing responded and EKG with 2 mm ST segment elevation in lead V2 with no reciprocal changes. Code STEMI called. Pt with ongoing chest pain. Mild dyspnea this am with some nausea. Echo 10/11/18 with normal LV systolic function.   Past Medical History:  Diagnosis Date   Acanthosis nigricans    Anxiety    CHF (congestive heart failure) (HCC)    Chronic lower back pain    Depression    DKA, type 1 (HCC) 09/13/2018   Dyspepsia    Obesity    Ovarian cyst    pt is not aware of this hx (11/24/2017)   Pre-diabetes    Precocious adrenarche (HCC)    Premature baby    Seizures (HCC)    Type II diabetes mellitus (HCC)    insulin dependant    Past Surgical History:  Procedure Laterality Date   ABDOMINAL HERNIA REPAIR     "I was a baby"   HERNIA REPAIR     TONSILLECTOMY AND ADENOIDECTOMY     WISDOM TOOTH EXTRACTION  2017      Home Medications:  Prior to Admission medications   Medication Sig Start Date End Date Taking?  Authorizing Provider  ARIPiprazole (ABILIFY) 20 MG tablet Take 20 mg by mouth daily.   Yes [provider]  atorvastatin (LIPITOR) 20 MG tablet Take 1 tablet (20 mg total) by mouth daily at 6 PM. 10/05/18  Yes Storm Frisk, MD  FLUoxetine (PROZAC) 40 MG capsule Take 1 capsule (40 mg total) by mouth at bedtime. 10/05/18  Yes Storm Frisk, MD  glucagon 1 MG injection Use for Severe Hypoglycemia . Inject 1 mg intramuscularly if unresponsive, unable to swallow, unconscious and/or has seizure Patient taking differently: Inject 1 mg into the muscle once as needed (for severe hypoglycemia- if unresponsive, unable to swallow, unconscious, and/or has had seizure(s)).  07/20/13 12/19/25 Yes Romero Belling, MD  hydrOXYzine (ATARAX/VISTARIL) 25 MG tablet Take 1 tablet (25 mg total) by mouth 3 (three) times daily as needed for anxiety. 10/05/18  Yes Storm Frisk, MD  Insulin Degludec (TRESIBA) 100 UNIT/ML SOLN Inject 52 Units into the skin daily. 10/05/18  Yes Storm Frisk, MD  insulin lispro (HUMALOG) 100 UNIT/ML injection Inject 0.15 mLs (15 Units total) into the skin 2 (two) times daily with a meal. 10/05/18  Yes Storm Frisk, MD  lamoTRIgine (LAMICTAL) 25 MG tablet Take 1 tablet (25 mg total) by mouth 2 (two) times daily. 10/05/18  Yes Storm Frisk, MD  Multiple Vitamin (MULTIVITAMIN WITH MINERALS)  TABS tablet Take 1 tablet by mouth daily. 09/18/18  Yes Clapacs, Jackquline Denmark, MD  promethazine (PHENERGAN) 25 MG tablet Take 1 tablet (25 mg total) by mouth every 8 (eight) hours as needed for nausea or vomiting. 10/05/18  Yes Storm Frisk, MD  topiramate (TOPAMAX) 25 MG tablet Take 1 tablet (25 mg total) by mouth 2 (two) times daily. 10/05/18  Yes Storm Frisk, MD  traZODone (DESYREL) 100 MG tablet Take 3 tablets (300 mg total) by mouth at bedtime. 10/05/18  Yes Hoy Register, MD    Inpatient Medications: Scheduled Meds:  alum & mag hydroxide-simeth  30 mL Oral Once    amLODipine  5 mg Oral Daily   ARIPiprazole  5 mg Oral Daily   atorvastatin  20 mg Oral q1800   bisacodyl  10 mg Rectal Daily   docusate sodium  100 mg Oral BID   fluconazole  100 mg Oral Daily   FLUoxetine  40 mg Oral QHS   insulin aspart  0-9 Units Subcutaneous TID WC   insulin aspart  15 Units Subcutaneous BID WC   insulin glargine  50 Units Subcutaneous QHS   lamoTRIgine  25 mg Oral BID   metoCLOPramide (REGLAN) injection  10 mg Intravenous Q6H   nitroGLYCERIN       pantoprazole (PROTONIX) IV  40 mg Intravenous BID   potassium chloride  40 mEq Oral BID   promethazine  12.5-25 mg Intravenous Q6H   propranolol  10 mg Oral TID   QUEtiapine  50 mg Oral QHS   sodium chloride flush  3 mL Intravenous Q12H   topiramate  25 mg Oral BID   Continuous Infusions:  sodium chloride 20 mL/hr at 10/13/18 0220   PRN Meds: acetaminophen **OR** acetaminophen, albuterol, hydrOXYzine, labetalol, magnesium hydroxide, traMADol  Allergies:    Allergies  Allergen Reactions   Ibuprofen Other (See Comments)    GI MD said to not take this anymore    Social History:   Social History   Socioeconomic History   Marital status: Single    Spouse name: Not on file   Number of children: 0   Years of education: Not on file   Highest education level: Not on file  Occupational History   Occupation: Admission  Social Needs   Financial resource strain: Not on file   Food insecurity    Worry: Not on file    Inability: Not on file   Transportation needs    Medical: Not on file    Non-medical: Not on file  Tobacco Use   Smoking status: Never Smoker   Smokeless tobacco: Never Used  Substance and Sexual Activity   Alcohol use: No    Alcohol/week: 0.0 standard drinks   Drug use: No   Sexual activity: Never  Lifestyle   Physical activity    Days per week: Not on file    Minutes per session: Not on file   Stress: Not on file  Relationships   Social connections     Talks on phone: Not on file    Gets together: Not on file    Attends religious service: Not on file    Active member of club or organization: Not on file    Attends meetings of clubs or organizations: Not on file    Relationship status: Not on file   Intimate partner violence    Fear of current or ex partner: Not on file    Emotionally abused: Not on file  Physically abused: Not on file    Forced sexual activity: Not on file  Other Topics Concern   Not on file  Social History Narrative   Lives with mom and mom's girlfriend.   No EtOH, tobacco, Drugs    Family History:    Family History  Problem Relation Age of Onset   Diabetes Mother    Hypertension Mother    Obesity Mother    Asthma Mother    Allergic rhinitis Mother    Eczema Mother    Diabetes Father    Hypertension Father    Obesity Father    Hyperlipidemia Father    Hypertension Paternal Aunt    Hypertension Maternal Grandfather    Colon cancer Maternal Grandfather    Diabetes Paternal Grandmother    Obesity Paternal Grandmother    Diabetes Paternal Grandfather    Obesity Paternal Grandfather    Angioedema Neg Hx    Immunodeficiency Neg Hx    Urticaria Neg Hx    Stomach cancer Neg Hx    Esophageal cancer Neg Hx      ROS:  Please see the history of present illness.  All other ROS reviewed and negative.     Physical Exam/Data:   Vitals:   10/13/18 0703 10/13/18 0722 10/13/18 0727 10/13/18 0741  BP: 104/72 114/73 (!) 103/55 (!) 110/57  Pulse: (!) 108 (!) 117 (!) 118 (!) 119  Resp:    16  Temp: 99.5 F (37.5 C)     TempSrc: Oral     SpO2: 100%   100%  Weight:      Height:        Intake/Output Summary (Last 24 hours) at 10/13/2018 0746 Last data filed at 10/13/2018 0534 Gross per 24 hour  Intake 2332.07 ml  Output --  Net 2332.07 ml   Last 3 Weights 10/08/2018 10/05/2018 10/01/2018  Weight (lbs) 198 lb 197 lb 197 lb  Weight (kg) 89.812 kg 89.359 kg 89.359 kg  Some  encounter information is confidential and restricted. Go to Review Flowsheets activity to see all data.     Body mass index is 35.08 kg/m.  General:  Well nourished, well developed, in no acute distress HEENT: normal Lymph: no adenopathy Neck: no JVD Endocrine:  No thryomegaly Vascular: No carotid bruits; FA pulses 2+ bilaterally without bruits  Cardiac:  normal S1, S2; RRR; no murmur  Lungs:  clear to auscultation bilaterally, no wheezing, rhonchi or rales  Abd: soft, nontender, no hepatomegaly  Ext: no edema Musculoskeletal:  No deformities, BUE and BLE strength normal and equal Skin: warm and dry  Neuro:  CNs 2-12 intact, no focal abnormalities noted Psych:  Normal affect   EKG:  The EKG was personally reviewed and demonstrates:  Sinus with ST elevation lead V2. No reciprocal changes.  Telemetry:  Telemetry was personally reviewed and demonstrates:  sinus  Relevant CV Studies: Echo 10/11/18:  1. Left ventricular ejection fraction, by visual estimation, is 60 to 65%. The left ventricle has normal function. Normal left ventricular size. There is mildly increased left ventricular hypertrophy.  2. Global right ventricle has normal systolic function.The right ventricular size is normal. No increase in right ventricular wall thickness.  3. Left atrial size was normal.  4. Right atrial size was normal.  5. Trivial pericardial effusion is present.  6. The mitral valve is normal in structure. No evidence of mitral valve regurgitation.  7. The tricuspid valve is normal in structure. Tricuspid valve regurgitation was not visualized by color  flow Doppler.  8. The aortic valve is tricuspid Aortic valve regurgitation was not visualized by color flow Doppler.  9. The pulmonic valve was grossly normal. Pulmonic valve regurgitation is not visualized by color flow Doppler. 10. The inferior vena cava is normal in size with greater than 50% respiratory variability, suggesting right atrial pressure of  3 mmHg.  Laboratory Data:  High Sensitivity Troponin:   Recent Labs  Lab 10/09/18 0500  TROPONINIHS 2     Chemistry Recent Labs  Lab 10/11/18 1022 10/12/18 0238 10/13/18 0254  NA 137 136 134*  K 3.5 4.0 4.1  CL 106 107 105  CO2 22 22 22   GLUCOSE 169* 184* 168*  BUN <5* <5* 10  CREATININE 0.46 0.59 0.66  CALCIUM 8.7* 8.5* 8.4*  GFRNONAA >60 >60 >60  GFRAA >60 >60 >60  ANIONGAP 9 7 7     Recent Labs  Lab 10/11/18 1022 10/12/18 0238 10/13/18 0254  ALBUMIN 3.7 3.3* 3.3*   Hematology Recent Labs  Lab 10/11/18 1022 10/12/18 0238 10/13/18 0254  WBC 7.6 9.0 12.8*  RBC 4.44 4.37 4.24  HGB 12.8 12.6 12.1  HCT 37.8 37.2 37.3  MCV 85.1 85.1 88.0  MCH 28.8 28.8 28.5  MCHC 33.9 33.9 32.4  RDW 13.2 13.4 14.0  PLT 331 351 367   BNPNo results for input(s): BNP, PROBNP in the last 168 hours.  DDimer No results for input(s): DDIMER in the last 168 hours.   Radiology/Studies:  No results found.  Assessment and Plan:   1. Acute chest pain with abnormal EKG: She appears to be comfortable but is describing chest pain. EKG with new ST elevation in lead V2. Will plan emergent cardiac catheterization.  I have reviewed the risks, indications, and alternatives to cardiac catheterization, possible angioplasty, and stenting with the patient. Risks include but are not limited to bleeding, infection, vascular injury, stroke, myocardial infection, arrhythmia, kidney injury, radiation-related injury in the case of prolonged fluoroscopy use, emergency cardiac surgery, and death. The patient understands the risks of serious complication is 1-2 in 1000 with diagnostic cardiac cath and 1-2% or less with angioplasty/stenting.  For questions or updates, please contact [facility] Please consult [email or web address] for contact info under     Signed, Verne Carrowhristopher Ruba Outen, MD  10/13/2018 7:46 AM

## 2018-10-13 NOTE — Interval H&P Note (Signed)
History and Physical Interval Note:  10/13/2018 8:04 AM  [name]  has presented today for surgery, with the diagnosis of chest pain, abnormal ekg.  The various methods of treatment have been discussed with the patient and family. After consideration of risks, benefits and other options for treatment, the patient has consented to  Procedure(s): CORONARY/GRAFT ACUTE MI REVASCULARIZATION (N/A) as a surgical intervention.  The patient's history has been reviewed, patient examined, no change in status, stable for surgery.  I have reviewed the patient's chart and labs.  Questions were answered to the patient's satisfaction.    Cath Lab Visit (complete for each Cath Lab visit)  Clinical Evaluation Leading to the Procedure:   ACS: No.  Non-ACS:    Anginal Classification: CCS IV  Anti-ischemic medical therapy: No Therapy  Non-Invasive Test Results: No non-invasive testing performed  Prior CABG: No previous CABG        [name]

## 2018-10-13 NOTE — Progress Notes (Signed)
TR BAND REMOVAL  LOCATION:    Radial rt radial site  DEFLATED PER PROTOCOL:   yes  TIME BAND OFF / DRESSING APPLIED:    1150/gauze and tegaderm  SITE UPON ARRIVAL:    Level 0  SITE AFTER BAND REMOVAL:    Level 0  CIRCULATION SENSATION AND MOVEMENT:    Within Normal Limits :  Yes, rt hand and fingers warm and pink, palpable rt radial pulse. Instructions reviewed w/patient  COMMENTS:

## 2018-10-13 NOTE — Progress Notes (Signed)
Patient was feeling chest pain on left thrombin and squeezing score 9. During that time BP was 104/72, pulse 108 and temp 99.5. called the rapid and done EKG. Given ntg q 5x 3. Rapid and cardiology came to see the patient's bed side. patient transport to the cath lab. Day shift nurse aware.

## 2018-10-13 NOTE — Progress Notes (Signed)
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 Reportedly, the patient was found down on the floor by family and was unresponsive. She does not recall how she ended up on the floor, but states that she has been having seizures. However, appears to have been taken off Keppra because it was "too strong". Complains of throwing up blood and her "stomach has been all messed up". Episodes of emesis werenoted to be bright red and coffee-ground discusses previous evaluations by [facility] where she notes being worked up and told that she did not have gastroparesis. Associated symptoms include constipation. Patient still reports being able to pass flatus.She had been taking her Lantus 52 units at night, but reports that she had not been taking the Humalog with meals because she was unable to keep any significant amount of food down.  Assessment & Plan:   Principal Problem:   MDD (major depressive disorder), recurrent severe, without psychosis (HCC) Active Problems:   Obesity   Nausea and vomiting   Generalized abdominal pain   Chronic abdominal pain   Conversion disorder with attacks or seizures, acute episode, with psychological stressor   DKA, type 1 (HCC)   Hypertensive urgency   Nonspecific abnormal electrocardiogram (ECG) (EKG)   Chest pain of uncertain etiology   DKA: Acute on chronic  - Patient presented with elevated blood glucose of 355 with CO2 21 and anion gap 16.  - Continue novolog 15 units BID WM, lantus50units daily - DKA resolved. Glucose trends stable  Abdominal pain: Acute on chronic.  - chronic  pain; PRN pain control. -lipase normal - GI onboard, appreciate assistance     - EGD: mild candida and gastritis; diflucan started -On my own read of CT abd, stool noted throughout colon in ascending, transverse, and descending colons, most prominently in splenic flexure    Nausea, vomiting/hematemesis: - followed by [facility] - has been on "every" nausea medicine per her report; and nothing works - IV reglan, protonix - See above, findings of stool per my read of CT abd. Will give trial of Mg citrate  Hypertensive urgency: Acute.  - Blood pressures elevated up to 161/106 on admission.  - Patient not on blood pressure medications at home. - Labetalol IV as needed - amlodipine 5 mg PO; can increase to 10mg . -BP stable  Sinus tachycardia: Acute on chronic.  - Heart rates into the 120s on admission.  - Patient previously was on propranolol in the past. - fluids, monitor -check TSH/FT4: TSH suppressed, FT4 is normal; check FT3     - Continue on propranolol 10 TID      - need follow up with endocrine outpt  Anxiety/depression:  - Patient follows in outpatient setting with [facility].  - She was seen by PCP 3 days ago, and Lexapro was recommended to be discontinued by PCP and Seroquel was stopped. - Continue Abilify, Prozac, Lamictal, and hydroxyzine as needed - Continue outpatient follow-up with [facility] on October 19 -Seems stable at present  Psychogenic seizures:  - Evaluated and previously noted not to have epileptiform seizures.  - Seizure precautions  Obesity:  - BMI 35.08 kg/m - recommend diet/lifestyle   Constipation - colace, PRN MoM  Syncopal episode -  no head injury reported.  - 2d echo with normal LVEF - orthostatics ok -This AM, pt noted to have L sided chest pains with new ST elevations -Cardiology consulted and pt now s/p heart cath 10/7 with  patent coronaries  DVT prophylaxis: SCD's Code Status: Full Family Communication: Pt in room, family not at bedside Disposition Plan: Uncertain at this time  Consultants:   Cardiology  GI  Procedures:   EGD 10/6  Heart cath 10/7  Antimicrobials: Anti-infectives (From admission, onward)   Start     Dose/Rate Route Frequency Ordered Stop   10/13/18 1000  fluconazole (DIFLUCAN) tablet 100 mg     100 mg Oral Daily 10/12/18 1114 10/27/18 0959   10/12/18 1200  fluconazole (DIFLUCAN) tablet 200 mg    Note to Pharmacy: Then 100mg  po qd x 14 days. Pl check for any drug-drug interactions.   200 mg Oral  Once 10/12/18 1048 10/12/18 1131       Subjective: Feeling constipated  Objective: Vitals:   10/13/18 1235 10/13/18 1335 10/13/18 1435 10/13/18 1502  BP: (!) 137/93 115/79 (!) 138/92 (!) 133/94  Pulse: (!) 112 (!) 113 (!) 103 (!) 115  Resp: (!) 22 20 20 18   Temp:      TempSrc:      SpO2: 100% 99% 100% 100%  Weight:      Height:        Intake/Output Summary (Last 24 hours) at 10/13/2018 1757 Last data filed at 10/13/2018 1659 Gross per 24 hour  Intake 2123.16 ml  Output -  Net 2123.16 ml   Filed Weights   10/08/18 1052  Weight: 89.8 kg    Examination:  General exam: Appears calm and comfortable  Respiratory system: Clear to auscultation. Respiratory effort normal. Cardiovascular system: S1 & S2 heard, RRR Gastrointestinal system: Abdomen is nondistended, soft and nontender. No organomegaly or masses felt. Normal bowel sounds heard. Central nervous system: Alert and oriented. No focal neurological deficits. Extremities: Symmetric 5 x 5 power. Skin: No rashes, lesions Psychiatry: Judgement and insight appear normal. Mood & affect appropriate.   Data Reviewed: I have personally reviewed following labs and imaging studies  CBC: Recent Labs  Lab 10/09/18 0500  10/10/18 0754 10/10/18 1500 10/10/18 2144 10/11/18 1022 10/12/18 0238 10/13/18 0254  WBC 7.8   --  6.4  --   --  7.6 9.0 12.8*  NEUTROABS  --   --  4.4  --   --  5.0 4.8 6.6  HGB 11.0*   < > 12.5 12.3 12.3 12.8 12.6 12.1  HCT 33.6*   < > 37.0 37.7 37.6 37.8 37.2 37.3  MCV 88.4  --  86.4  --   --  85.1 85.1 88.0  PLT 291  --  312  --   --  331 351 367   < > = values in this interval not displayed.   Basic Metabolic Panel: Recent Labs  Lab 10/09/18 0500 10/10/18 0754 10/11/18 1022 10/12/18 0238 10/13/18 0254  NA 137 137 137 136 134*  K 3.7 3.3* 3.5 4.0 4.1  CL 110 107 106 107 105  CO2 21* 20* 22 22 22   GLUCOSE 195* 184* 169* 184* 168*  BUN 7 <5* <5* <5* 10  CREATININE 0.42* 0.40* 0.46 0.59 0.66  CALCIUM 7.8* 8.3* 8.7* 8.5* 8.4*  MG 2.2 2.1 2.0 2.1 2.0  PHOS  --  1.9* 2.4* 2.5 3.3   GFR: Estimated Creatinine Clearance: 119.4 mL/min (by C-G formula based on SCr of  0.66 mg/dL). Liver Function Tests: Recent Labs  Lab 10/10/18 0754 10/11/18 1022 10/12/18 0238 10/13/18 0254  ALBUMIN 3.5 3.7 3.3* 3.3*   Recent Labs  Lab 10/08/18 1104 10/11/18 1609  LIPASE 20 22   No results for input(s): AMMONIA in the last 168 hours. Coagulation Profile: No results for input(s): INR, PROTIME in the last 168 hours. Cardiac Enzymes: No results for input(s): CKTOTAL, CKMB, CKMBINDEX, TROPONINI in the last 168 hours. BNP (last 3 results) No results for input(s): PROBNP in the last 8760 hours. HbA1C: No results for input(s): HGBA1C in the last 72 hours. CBG: Recent Labs  Lab 10/12/18 1202 10/12/18 1623 10/12/18 2113 10/13/18 0936 10/13/18 1726  GLUCAP 117* 153* 203* 146* 196*   Lipid Profile: No results for input(s): CHOL, HDL, LDLCALC, TRIG, CHOLHDL, LDLDIRECT in the last 72 hours. Thyroid Function Tests: Recent Labs    10/11/18 1609 10/12/18 1300  TSH 0.199*  --   FREET4 1.07  --   T3FREE  --  2.5   Anemia Panel: No results for input(s): VITAMINB12, FOLATE, FERRITIN, TIBC, IRON, RETICCTPCT in the last 72 hours. Sepsis Labs: No results for input(s): PROCALCITON,  LATICACIDVEN in the last 168 hours.  Recent Results (from the past 240 hour(s))  MRSA PCR Screening     Status: None   Collection Time: 10/10/18  8:51 AM   Specimen: Nasal Mucosa; Nasopharyngeal  Result Value Ref Range Status   MRSA by PCR NEGATIVE NEGATIVE Final    Comment:        The GeneXpert MRSA Assay (FDA approved for NASAL specimens only), is one component of a comprehensive MRSA colonization surveillance program. It is not intended to diagnose MRSA infection nor to guide or monitor treatment for MRSA infections. Performed at [facility], [address], [place], [place] [address]   SARS CORONAVIRUS 2 (TAT 6-24 HRS) Nasopharyngeal Nasopharyngeal Swab     Status: None   Collection Time: 10/11/18  3:18 PM   Specimen: Nasopharyngeal Swab  Result Value Ref Range Status   SARS Coronavirus 2 NEGATIVE NEGATIVE Final    Comment: (NOTE) SARS-CoV-2 target nucleic acids are NOT DETECTED. The SARS-CoV-2 RNA is generally detectable in upper and lower respiratory specimens during the acute phase of infection. Negative results do not preclude SARS-CoV-2 infection, do not rule out co-infections with other pathogens, and should not be used as the sole basis for treatment or other patient management decisions. Negative results must be combined with clinical observations, patient history, and epidemiological information. The expected result is Negative. Fact Sheet for Patients: HairSlick.no Fact Sheet for Healthcare Providers: quierodirigir.com This test is not yet approved or cleared by the [place] FDA and  has been authorized for detection and/or diagnosis of SARS-CoV-2 by FDA under an Emergency Use Authorization (EUA). This EUA will remain  in effect (meaning this test can be used) for the duration of the COVID-19 declaration under Section 56 4(b)(1) of the Act, 21 U.S.C. section 360bbb-3(b)(1), unless the  authorization is terminated or revoked sooner. Performed at [facility], [address], [place], [place] [address]      Radiology Studies: No results found.  Scheduled Meds: . alum & mag hydroxide-simeth  30 mL Oral Once  . amLODipine  5 mg Oral Daily  . ARIPiprazole  5 mg Oral Daily  . atorvastatin  20 mg Oral q1800  . bisacodyl  10 mg Rectal Daily  . docusate sodium  100 mg Oral BID  . fluconazole  100 mg Oral Daily  . FLUoxetine  40 mg Oral QHS  . insulin aspart  0-9 Units Subcutaneous TID WC  . insulin aspart  15 Units Subcutaneous BID WC  . insulin glargine  50 Units Subcutaneous QHS  . lamoTRIgine  25 mg Oral BID  . metoCLOPramide (REGLAN) injection  10 mg Intravenous Q6H  . nitroGLYCERIN      . pantoprazole (PROTONIX) IV  40 mg Intravenous BID  . potassium chloride  40 mEq Oral BID  . promethazine  12.5-25 mg Intravenous Q6H  . propranolol  10 mg Oral TID  . QUEtiapine  50 mg Oral QHS  . sodium chloride flush  3 mL Intravenous Q12H  . topiramate  25 mg Oral BID   Continuous Infusions: . sodium chloride 100 mL/hr at 10/13/18 1718  . sodium chloride       LOS: 4 days   Marylu Lund, MD [facility] Pager On Amion  If 7PM-7AM, please contact night-coverage 10/13/2018, 5:57 PM

## 2018-10-13 NOTE — Progress Notes (Signed)
1949 Patient asked that blood sugar be checked. Blood sugar checked 59 patient verbalized that she is hungry and has missed meal today because heart cath.Patient given a meal to eat.It took patient about 45 minutes to eat meal and rechecked blood sugar 122.Patient scheduled to receive 50 units of lantus tonight.This nurse paged NP Chaney Malling to inform of blood sugar drop and to see if full dose of lantus should be given tonight.Per NP Schorr okay to give full dose of insulin.Will continue to monitor patient.

## 2018-10-13 NOTE — Progress Notes (Signed)
Site area: rt groin fa and fv sheaths pulled and pressure held by [employer] Prior to Removal:  Level 0 Pressure Applied For: 20 minutes Manual:   yes Patient Status During Pull:  stable Post Pull Site:  Level  0 Post Pull Instructions Given:  yes Post Pull Pulses Present: rt dp palpable Dressing Applied:  Gauze and tegaderm Bedrest begins @ 4462 Comments:

## 2018-10-13 NOTE — H&P (View-Only) (Signed)
Cardiology Consultation:   Patient ID: Nancy [name] MRN: [id number]; DOB: Mar 13, 2000  Admit date: 10/08/2018 Date of Consult: 10/13/2018  Primary Care Provider: Claiborne RiggFleming, Zelda W, NP Primary Cardiologist: Dietrich PatesPaula Ross, MD  Primary Electrophysiologist:  None    Patient Profile:   Nancy [name] is a 18 y.o. female with a hx of DM, DKA, chronic abdominal pain, prior syncope, depression, anxiety and seizures who is being seen today for the evaluation of chest pain and abnormal EKG.   History of Present Illness:   Nancy [name] with a history of IDDM, DKA, chronic abdominal pain, prior syncope, depression, anxiety and seizures who is being seen today for the evaluation of chest pain and abnormal EKG. She was admitted after a syncopal episode and was found to have DKA. She also describes hematemesis. GI workup with upper EGD yesterday with gastritis. This am she began having severe chest pain. Rapid response nursing responded and EKG with 2 mm ST segment elevation in lead V2 with no reciprocal changes. Code STEMI called. Pt with ongoing chest pain. Mild dyspnea this am with some nausea. Echo 10/11/18 with normal LV systolic function.   Past Medical History:  Diagnosis Date   Acanthosis nigricans    Anxiety    CHF (congestive heart failure) (HCC)    Chronic lower back pain    Depression    DKA, type 1 (HCC) 09/13/2018   Dyspepsia    Obesity    Ovarian cyst    pt is not aware of this hx (11/24/2017)   Pre-diabetes    Precocious adrenarche (HCC)    Premature baby    Seizures (HCC)    Type II diabetes mellitus (HCC)    insulin dependant    Past Surgical History:  Procedure Laterality Date   ABDOMINAL HERNIA REPAIR     "I was a baby"   HERNIA REPAIR     TONSILLECTOMY AND ADENOIDECTOMY     WISDOM TOOTH EXTRACTION  2017      Home Medications:  Prior to Admission medications   Medication Sig Start Date End Date Taking? Authorizing Provider  ARIPiprazole (ABILIFY)  20 MG tablet Take 20 mg by mouth daily.   Yes [provider]  atorvastatin (LIPITOR) 20 MG tablet Take 1 tablet (20 mg total) by mouth daily at 6 PM. 10/05/18  Yes Storm FriskWright, Patrick E, MD  FLUoxetine (PROZAC) 40 MG capsule Take 1 capsule (40 mg total) by mouth at bedtime. 10/05/18  Yes Storm FriskWright, Patrick E, MD  glucagon 1 MG injection Use for Severe Hypoglycemia . Inject 1 mg intramuscularly if unresponsive, unable to swallow, unconscious and/or has seizure Patient taking differently: Inject 1 mg into the muscle once as needed (for severe hypoglycemia- if unresponsive, unable to swallow, unconscious, and/or has had seizure(s)).  07/20/13 12/19/25 Yes Romero BellingEllison, Sean, MD  hydrOXYzine (ATARAX/VISTARIL) 25 MG tablet Take 1 tablet (25 mg total) by mouth 3 (three) times daily as needed for anxiety. 10/05/18  Yes Storm FriskWright, Patrick E, MD  Insulin Degludec (TRESIBA) 100 UNIT/ML SOLN Inject 52 Units into the skin daily. 10/05/18  Yes Storm FriskWright, Patrick E, MD  insulin lispro (HUMALOG) 100 UNIT/ML injection Inject 0.15 mLs (15 Units total) into the skin 2 (two) times daily with a meal. 10/05/18  Yes Storm FriskWright, Patrick E, MD  lamoTRIgine (LAMICTAL) 25 MG tablet Take 1 tablet (25 mg total) by mouth 2 (two) times daily. 10/05/18  Yes Storm FriskWright, Patrick E, MD  Multiple Vitamin (MULTIVITAMIN WITH MINERALS) TABS tablet Take 1 tablet by mouth  daily. 09/18/18  Yes Clapacs, Jackquline Denmark, MD  promethazine (PHENERGAN) 25 MG tablet Take 1 tablet (25 mg total) by mouth every 8 (eight) hours as needed for nausea or vomiting. 10/05/18  Yes Storm Frisk, MD  topiramate (TOPAMAX) 25 MG tablet Take 1 tablet (25 mg total) by mouth 2 (two) times daily. 10/05/18  Yes Storm Frisk, MD  traZODone (DESYREL) 100 MG tablet Take 3 tablets (300 mg total) by mouth at bedtime. 10/05/18  Yes Hoy Register, MD    Inpatient Medications: Scheduled Meds:  alum & mag hydroxide-simeth  30 mL Oral Once   amLODipine  5 mg Oral Daily   ARIPiprazole  5 mg  Oral Daily   atorvastatin  20 mg Oral q1800   bisacodyl  10 mg Rectal Daily   docusate sodium  100 mg Oral BID   fluconazole  100 mg Oral Daily   FLUoxetine  40 mg Oral QHS   insulin aspart  0-9 Units Subcutaneous TID WC   insulin aspart  15 Units Subcutaneous BID WC   insulin glargine  50 Units Subcutaneous QHS   lamoTRIgine  25 mg Oral BID   metoCLOPramide (REGLAN) injection  10 mg Intravenous Q6H   nitroGLYCERIN       pantoprazole (PROTONIX) IV  40 mg Intravenous BID   potassium chloride  40 mEq Oral BID   promethazine  12.5-25 mg Intravenous Q6H   propranolol  10 mg Oral TID   QUEtiapine  50 mg Oral QHS   sodium chloride flush  3 mL Intravenous Q12H   topiramate  25 mg Oral BID   Continuous Infusions:  sodium chloride 20 mL/hr at 10/13/18 0220   PRN Meds: acetaminophen **OR** acetaminophen, albuterol, hydrOXYzine, labetalol, magnesium hydroxide, traMADol  Allergies:    Allergies  Allergen Reactions   Ibuprofen Other (See Comments)    GI MD said to not take this anymore    Social History:   Social History   Socioeconomic History   Marital status: Single    Spouse name: Not on file   Number of children: 0   Years of education: Not on file   Highest education level: Not on file  Occupational History   Occupation: Admission  Social Needs   Financial resource strain: Not on file   Food insecurity    Worry: Not on file    Inability: Not on file   Transportation needs    Medical: Not on file    Non-medical: Not on file  Tobacco Use   Smoking status: Never Smoker   Smokeless tobacco: Never Used  Substance and Sexual Activity   Alcohol use: No    Alcohol/week: 0.0 standard drinks   Drug use: No   Sexual activity: Never  Lifestyle   Physical activity    Days per week: Not on file    Minutes per session: Not on file   Stress: Not on file  Relationships   Social connections    Talks on phone: Not on file    Gets together:  Not on file    Attends religious service: Not on file    Active member of club or organization: Not on file    Attends meetings of clubs or organizations: Not on file    Relationship status: Not on file   Intimate partner violence    Fear of current or ex partner: Not on file    Emotionally abused: Not on file    Physically abused: Not on file  Forced sexual activity: Not on file  Other Topics Concern   Not on file  Social History Narrative   Lives with mom and mom's girlfriend.   No EtOH, tobacco, Drugs    Family History:    Family History  Problem Relation Age of Onset   Diabetes Mother    Hypertension Mother    Obesity Mother    Asthma Mother    Allergic rhinitis Mother    Eczema Mother    Diabetes Father    Hypertension Father    Obesity Father    Hyperlipidemia Father    Hypertension Paternal Aunt    Hypertension Maternal Grandfather    Colon cancer Maternal Grandfather    Diabetes Paternal Grandmother    Obesity Paternal Grandmother    Diabetes Paternal Grandfather    Obesity Paternal Grandfather    Angioedema Neg Hx    Immunodeficiency Neg Hx    Urticaria Neg Hx    Stomach cancer Neg Hx    Esophageal cancer Neg Hx      ROS:  Please see the history of present illness.  All other ROS reviewed and negative.     Physical Exam/Data:   Vitals:   10/13/18 0703 10/13/18 0722 10/13/18 0727 10/13/18 0741  BP: 104/72 114/73 (!) 103/55 (!) 110/57  Pulse: (!) 108 (!) 117 (!) 118 (!) 119  Resp:    16  Temp: 99.5 F (37.5 C)     TempSrc: Oral     SpO2: 100%   100%  Weight:      Height:        Intake/Output Summary (Last 24 hours) at 10/13/2018 0746 Last data filed at 10/13/2018 0534 Gross per 24 hour  Intake 2332.07 ml  Output --  Net 2332.07 ml   Last 3 Weights 10/08/2018 10/05/2018 10/01/2018  Weight (lbs) 198 lb 197 lb 197 lb  Weight (kg) 89.812 kg 89.359 kg 89.359 kg  Some encounter information is confidential and restricted.  Go to Review Flowsheets activity to see all data.     Body mass index is 35.08 kg/m.  General:  Well nourished, well developed, in no acute distress HEENT: normal Lymph: no adenopathy Neck: no JVD Endocrine:  No thryomegaly Vascular: No carotid bruits; FA pulses 2+ bilaterally without bruits  Cardiac:  normal S1, S2; RRR; no murmur  Lungs:  clear to auscultation bilaterally, no wheezing, rhonchi or rales  Abd: soft, nontender, no hepatomegaly  Ext: no edema Musculoskeletal:  No deformities, BUE and BLE strength normal and equal Skin: warm and dry  Neuro:  CNs 2-12 intact, no focal abnormalities noted Psych:  Normal affect   EKG:  The EKG was personally reviewed and demonstrates:  Sinus with ST elevation lead V2. No reciprocal changes.  Telemetry:  Telemetry was personally reviewed and demonstrates:  sinus  Relevant CV Studies: Echo 10/11/18:  1. Left ventricular ejection fraction, by visual estimation, is 60 to 65%. The left ventricle has normal function. Normal left ventricular size. There is mildly increased left ventricular hypertrophy.  2. Global right ventricle has normal systolic function.The right ventricular size is normal. No increase in right ventricular wall thickness.  3. Left atrial size was normal.  4. Right atrial size was normal.  5. Trivial pericardial effusion is present.  6. The mitral valve is normal in structure. No evidence of mitral valve regurgitation.  7. The tricuspid valve is normal in structure. Tricuspid valve regurgitation was not visualized by color flow Doppler.  8. The aortic valve is  tricuspid Aortic valve regurgitation was not visualized by color flow Doppler.  9. The pulmonic valve was grossly normal. Pulmonic valve regurgitation is not visualized by color flow Doppler. 10. The inferior vena cava is normal in size with greater than 50% respiratory variability, suggesting right atrial pressure of 3 mmHg.  Laboratory Data:  High Sensitivity  Troponin:   Recent Labs  Lab 10/09/18 0500  TROPONINIHS 2     Chemistry Recent Labs  Lab 10/11/18 1022 10/12/18 0238 10/13/18 0254  NA 137 136 134*  K 3.5 4.0 4.1  CL 106 107 105  CO2 22 22 22   GLUCOSE 169* 184* 168*  BUN <5* <5* 10  CREATININE 0.46 0.59 0.66  CALCIUM 8.7* 8.5* 8.4*  GFRNONAA >60 >60 >60  GFRAA >60 >60 >60  ANIONGAP 9 7 7     Recent Labs  Lab 10/11/18 1022 10/12/18 0238 10/13/18 0254  ALBUMIN 3.7 3.3* 3.3*   Hematology Recent Labs  Lab 10/11/18 1022 10/12/18 0238 10/13/18 0254  WBC 7.6 9.0 12.8*  RBC 4.44 4.37 4.24  HGB 12.8 12.6 12.1  HCT 37.8 37.2 37.3  MCV 85.1 85.1 88.0  MCH 28.8 28.8 28.5  MCHC 33.9 33.9 32.4  RDW 13.2 13.4 14.0  PLT 331 351 367   BNPNo results for input(s): BNP, PROBNP in the last 168 hours.  DDimer No results for input(s): DDIMER in the last 168 hours.   Radiology/Studies:  No results found.  Assessment and Plan:   1. Acute chest pain with abnormal EKG: She appears to be comfortable but is describing chest pain. EKG with new ST elevation in lead V2. Will plan emergent cardiac catheterization.  I have reviewed the risks, indications, and alternatives to cardiac catheterization, possible angioplasty, and stenting with the patient. Risks include but are not limited to bleeding, infection, vascular injury, stroke, myocardial infection, arrhythmia, kidney injury, radiation-related injury in the case of prolonged fluoroscopy use, emergency cardiac surgery, and death. The patient understands the risks of serious complication is 1-2 in 5916 with diagnostic cardiac cath and 1-2% or less with angioplasty/stenting.  For questions or updates, please contact [facility] Please consult [email or web address] for contact info under     Signed, [name], MD  10/13/2018 7:46 AM

## 2018-10-13 NOTE — Significant Event (Signed)
Rapid Response Event Note  Overview: Time Called: 0704 Arrival Time: 0707 Event Type: Cardiac  Initial Focused Assessment: Patient complaining of 9/10 chest pain, describes it as squeezing in left chest.  Not reproducible.  She states that she has had chest pain before but not this sever. Lung sounds clear BP 104/72  ST 107  RR17  O2 sat 100% on RA RN notified MD of CP via amion prior to my arrival  Interventions: Placed on 3L [place] 12 lead EKG done:  ST elevation noted in V2 Code Stemi called Dr Wyline Copas notified via amion of EKG results NTG SL given x3 no effect on CP  Dr Angelena Form at bedside to assess patient Spoke with Dr Wyline Copas via phone  Lab drew troponin Placed on [place] Transported to Cath lab as "Code Stemi"   Plan of Care (if not transferred):  Event Summary: Name of Physician Notified: Marylu Lund at [facility]  Name of Consulting Physician Notified: Dr Angelena Form at [facility] Time: 0800  Raliegh Ip

## 2018-10-14 ENCOUNTER — Encounter (HOSPITAL_COMMUNITY): Payer: Self-pay | Admitting: Cardiovascular Disease

## 2018-10-14 DIAGNOSIS — R079 Chest pain, unspecified: Secondary | ICD-10-CM

## 2018-10-14 DIAGNOSIS — E1010 Type 1 diabetes mellitus with ketoacidosis without coma: Principal | ICD-10-CM

## 2018-10-14 DIAGNOSIS — F332 Major depressive disorder, recurrent severe without psychotic features: Secondary | ICD-10-CM

## 2018-10-14 LAB — TROPONIN I (HIGH SENSITIVITY): Troponin I (High Sensitivity): 3 ng/L (ref <18)

## 2018-10-14 LAB — COMPREHENSIVE METABOLIC PANEL
ALT: 22 U/L (ref 0–44)
AST: 18 U/L (ref 15–41)
Albumin: 3.3 g/dL — ABNORMAL LOW (ref 3.5–5.0)
Alkaline Phosphatase: 62 U/L (ref 38–126)
Anion gap: 9 (ref 5–15)
BUN: 15 mg/dL (ref 6–20)
CO2: 22 mmol/L (ref 22–32)
Calcium: 9 mg/dL (ref 8.9–10.3)
Chloride: 103 mmol/L (ref 98–111)
Creatinine, Ser: 0.5 mg/dL (ref 0.44–1.00)
GFR calc Af Amer: >60 mL/min (ref >60)
GFR calc non Af Amer: >60 mL/min (ref >60)
Glucose, Bld: 178 mg/dL — ABNORMAL HIGH (ref 70–99)
Potassium: 4.9 mmol/L (ref 3.5–5.1)
Sodium: 134 mmol/L — ABNORMAL LOW (ref 135–145)
Total Bilirubin: 0.4 mg/dL (ref 0.3–1.2)
Total Protein: 6.2 g/dL — ABNORMAL LOW (ref 6.5–8.1)

## 2018-10-14 LAB — GLUCOSE, CAPILLARY
Glucose-Capillary: 98 mg/dL (ref 70–99)
Glucose-Capillary: 140 mg/dL — ABNORMAL HIGH (ref 70–99)
Glucose-Capillary: 236 mg/dL — ABNORMAL HIGH (ref 70–99)
Glucose-Capillary: 203 mg/dL — ABNORMAL HIGH (ref 70–99)

## 2018-10-14 LAB — MAGNESIUM: Magnesium: 2.3 mg/dL (ref 1.7–2.4)

## 2018-10-14 MED ORDER — PANTOPRAZOLE SODIUM 40 MG PO TBEC
40.0000 mg | DELAYED_RELEASE_TABLET | Freq: Every day | ORAL | Status: DC
  Administered 2018-10-15: 40 mg via ORAL
  Filled 2018-10-14: qty 1

## 2018-10-14 MED ORDER — BISACODYL 10 MG RE SUPP
10.0000 mg | Freq: Once | RECTAL | Status: AC
  Administered 2018-10-14: 10 mg via RECTAL
  Filled 2018-10-14: qty 1

## 2018-10-14 MED ORDER — CALCIUM CARBONATE ANTACID 500 MG PO CHEW
1.0000 | CHEWABLE_TABLET | Freq: Once | ORAL | Status: AC | PRN
  Administered 2018-10-14: 200 mg via ORAL
  Filled 2018-10-14: qty 1

## 2018-10-14 MED ORDER — METOCLOPRAMIDE HCL 10 MG PO TABS
10.0000 mg | ORAL_TABLET | Freq: Three times a day (TID) | ORAL | Status: DC
  Administered 2018-10-14 – 2018-10-15 (×4): 10 mg via ORAL
  Filled 2018-10-14 (×4): qty 1

## 2018-10-14 MED ORDER — PROMETHAZINE HCL 25 MG/ML IJ SOLN: 12.5000 mg | Freq: Four times a day (QID) | INTRAMUSCULAR | Status: DC | PRN

## 2018-10-14 MED ORDER — LACTULOSE 10 GM/15ML PO SOLN
20.0000 g | ORAL | Status: AC
  Administered 2018-10-14: 20 g via ORAL
  Filled 2018-10-14: qty 30

## 2018-10-14 NOTE — Progress Notes (Signed)
Soaps suds enema administered to patient with mild relief. MD Wyline Copas aware.

## 2018-10-14 NOTE — Progress Notes (Signed)
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 Reportedly, the patient was found down on the floor by family and was unresponsive. She does not recall how she ended up on the floor, but states that she has been having seizures. However, appears to have been taken off Keppra because it was "too strong". Complains of throwing up blood and her "stomach has been all messed up". Episodes of emesis werenoted to be bright red and coffee-ground discusses previous evaluations by [facility] where she notes being worked up and told that she did not have gastroparesis. Associated symptoms include constipation. Patient still reports being able to pass flatus.She had been taking her Lantus 52 units at night, but reports that she had not been taking the Humalog with meals because she was unable to keep any significant amount of food down.  Assessment & Plan:   Principal Problem:   MDD (major depressive disorder), recurrent severe, without psychosis (HCC) Active Problems:   Obesity   Nausea and vomiting   Generalized abdominal pain   Chronic abdominal pain   Conversion disorder with attacks or seizures, acute episode, with psychological stressor   DKA, type 1 (HCC)   Hypertensive urgency   Nonspecific abnormal electrocardiogram (ECG) (EKG)   Chest pain of uncertain etiology   DKA: Acute on chronic  - Patient presented with elevated blood glucose of 355 with CO2 21 and anion gap 16.  - Continue novolog 15 units BID WM, lantus50units daily - DKA resolved. Glucose trends remain stable  Abdominal pain: Acute on chronic.  -  chronic pain; PRN pain control. -lipase normal - GI onboard, appreciate assistance     - EGD: mild candida and gastritis; diflucan started -On my own read of CT abd, stool noted throughout colon in ascending, transverse, and descending colons, most prominently in splenic flexure  -No results with Mg citrate with mild results following soap suds enema -Pt remains symptomatic with continued abd pain and nausea. Given high risk for re-admission, will give trial of lactulose with dulcolax suppository    Nausea, vomiting/hematemesis: - followed by [facility] - has been on "every" nausea medicine per her report; and nothing works - IV reglan, protonix - See above, findings of stool per my read of CT abd.  - See above. Will continue with cathartics as tolerated  Hypertensive urgency: Acute.  - Blood pressures elevated up to 161/106 on admission.  - Labetalol IV as needed - Continued on amlodipine 5 mg PO with 10mg  propranalol TID -BP remains stable at this time  Sinus tachycardia: Acute on chronic.  - Heart rates into the 120s on admission.  - Patient previously was on propranolol in the past. - fluids, monitor -check TSH/FT4: TSH suppressed, FT4 is normal; check FT3     - Continue on propranolol 10 TID   Anxiety/depression:  - Patient follows in outpatient setting with [facility].  - She was seen by PCP 3 days ago, and Lexapro was recommended to be discontinued by PCP and Seroquel was stopped. - Continue Abilify, Prozac, Lamictal, and hydroxyzine as needed - Continue outpatient follow-up with [facility] on October 19 -Seems stable at present  Psychogenic seizures:  - Evaluated and previously noted not to have  epileptiform seizures.  - Remains stable  Obesity:  - BMI 35.08 kg/m - recommend diet/lifestyle   Constipation - colace, PRN MoM  Syncopal episode - no head injury  reported.  - 2d echo with normal LVEF - Not orthostatic -On 10/7 pt noted to have L sided chest pains with new ST elevations -Cardiology consulted and pt now s/p heart cath 10/7 with patent coronaries -Cardiology recommends repeat trop, if neg, then no further cardiac w/u. Trop is neg  DVT prophylaxis: SCD's Code Status: Full Family Communication: Pt in room, family not at bedside Disposition Plan: Uncertain at this time  Consultants:   Cardiology  GI  Procedures:   EGD 10/6  Heart cath 10/7  Antimicrobials: Anti-infectives (From admission, onward)   Start     Dose/Rate Route Frequency Ordered Stop   10/13/18 1000  fluconazole (DIFLUCAN) tablet 100 mg     100 mg Oral Daily 10/12/18 1114 10/27/18 0959   10/12/18 1200  fluconazole (DIFLUCAN) tablet 200 mg    Note to Pharmacy: Then 100mg  po qd x 14 days. Pl check for any drug-drug interactions.   200 mg Oral  Once 10/12/18 1048 10/12/18 1131      Subjective: Still with marked lower abd discomfort with no BM this AM despite Mg citrate  Objective: Vitals:   10/13/18 2329 10/14/18 0425 10/14/18 0803 10/14/18 1155  BP: 129/89 116/82 114/73 111/68  Pulse:  (!) 118 (!) 109 (!) 105  Resp: (!) 21 20  18   Temp: 99.1 F (37.3 C) 98.4 F (36.9 C) 98.5 F (36.9 C) 99.7 F (37.6 C)  TempSrc: Oral Oral Oral Oral  SpO2: 100% 100% 100% 100%  Weight:      Height:        Intake/Output Summary (Last 24 hours) at 10/14/2018 1628 Last data filed at 10/14/2018 0845 Gross per 24 hour  Intake 1900.91 ml  Output -  Net 1900.91 ml   Filed Weights   10/08/18 1052  Weight: 89.8 kg    Examination: General exam: Awake, laying in bed, in nad Respiratory system: Normal respiratory effort, no wheezing Cardiovascular system: regular rate, s1, s2 Gastrointestinal system: Generalized tenderness on mild palpation, pos BS Central nervous system: CN2-12 grossly intact, strength intact Extremities: Perfused, no clubbing Skin:  Normal skin turgor, no notable skin lesions seen Psychiatry: Mood normal // no visual hallucinations   Data Reviewed: I have personally reviewed following labs and imaging studies  CBC: Recent Labs  Lab 10/09/18 0500  10/10/18 0754 10/10/18 1500 10/10/18 2144 10/11/18 1022 10/12/18 0238 10/13/18 0254  WBC 7.8  --  6.4  --   --  7.6 9.0 12.8*  NEUTROABS  --   --  4.4  --   --  5.0 4.8 6.6  HGB 11.0*   < > 12.5 12.3 12.3 12.8 12.6 12.1  HCT 33.6*   < > 37.0 37.7 37.6 37.8 37.2 37.3  MCV 88.4  --  86.4  --   --  85.1 85.1 88.0  PLT 291  --  312  --   --  331 351 367   < > = values in this interval not displayed.   Basic Metabolic Panel: Recent Labs  Lab 10/10/18 0754 10/11/18 1022 10/12/18 0238 10/13/18 0254 10/14/18 0249  NA 137 137 136 134* 134*  K 3.3* 3.5 4.0 4.1 4.9  CL 107 106 107 105 103  CO2 20* 22 22 22 22   GLUCOSE 184* 169* 184* 168* 178*  BUN <5* <5* <  5* 10 15  CREATININE 0.40* 0.46 0.59 0.66 0.50  CALCIUM 8.3* 8.7* 8.5* 8.4* 9.0  MG 2.1 2.0 2.1 2.0 2.3  PHOS 1.9* 2.4* 2.5 3.3  --    GFR: Estimated Creatinine Clearance: 119.4 mL/min (by C-G formula based on SCr of 0.5 mg/dL). Liver Function Tests: Recent Labs  Lab 10/10/18 0754 10/11/18 1022 10/12/18 0238 10/13/18 0254 10/14/18 0249  AST  --   --   --   --  18  ALT  --   --   --   --  22  ALKPHOS  --   --   --   --  62  BILITOT  --   --   --   --  0.4  PROT  --   --   --   --  6.2*  ALBUMIN 3.5 3.7 3.3* 3.3* 3.3*   Recent Labs  Lab 10/08/18 1104 10/11/18 1609  LIPASE 20 22   No results for input(s): AMMONIA in the last 168 hours. Coagulation Profile: No results for input(s): INR, PROTIME in the last 168 hours. Cardiac Enzymes: No results for input(s): CKTOTAL, CKMB, CKMBINDEX, TROPONINI in the last 168 hours. BNP (last 3 results) No results for input(s): PROBNP in the last 8760 hours. HbA1C: No results for input(s): HGBA1C in the last 72 hours. CBG: Recent Labs  Lab 10/13/18 1726  10/13/18 1949 10/13/18 2037 10/14/18 0801 10/14/18 1153  GLUCAP 196* 59* 122* 98 140*   Lipid Profile: No results for input(s): CHOL, HDL, LDLCALC, TRIG, CHOLHDL, LDLDIRECT in the last 72 hours. Thyroid Function Tests: Recent Labs    10/12/18 1300  T3FREE 2.5   Anemia Panel: No results for input(s): VITAMINB12, FOLATE, FERRITIN, TIBC, IRON, RETICCTPCT in the last 72 hours. Sepsis Labs: No results for input(s): PROCALCITON, LATICACIDVEN in the last 168 hours.  Recent Results (from the past 240 hour(s))  MRSA PCR Screening     Status: None   Collection Time: 10/10/18  8:51 AM   Specimen: Nasal Mucosa; Nasopharyngeal  Result Value Ref Range Status   MRSA by PCR NEGATIVE NEGATIVE Final    Comment:        The GeneXpert MRSA Assay (FDA approved for NASAL specimens only), is one component of a comprehensive MRSA colonization surveillance program. It is not intended to diagnose MRSA infection nor to guide or monitor treatment for MRSA infections. Performed at [facility], [address] [place], [place] [address]   SARS CORONAVIRUS 2 (TAT 6-24 HRS) Nasopharyngeal Nasopharyngeal Swab     Status: None   Collection Time: 10/11/18  3:18 PM   Specimen: Nasopharyngeal Swab  Result Value Ref Range Status   SARS Coronavirus 2 NEGATIVE NEGATIVE Final    Comment: (NOTE) SARS-CoV-2 target nucleic acids are NOT DETECTED. The SARS-CoV-2 RNA is generally detectable in upper and lower respiratory specimens during the acute phase of infection. Negative results do not preclude SARS-CoV-2 infection, do not rule out co-infections with other pathogens, and should not be used as the sole basis for treatment or other patient management decisions. Negative results must be combined with clinical observations, patient history, and epidemiological information. The expected result is Negative. Fact Sheet for Patients: HairSlick.no Fact Sheet for Healthcare  Providers: quierodirigir.com This test is not yet approved or cleared by the [place] FDA and  has been authorized for detection and/or diagnosis of SARS-CoV-2 by FDA under an Emergency Use Authorization (EUA). This EUA will remain  in effect (meaning this test can  be used) for the duration of the COVID-19 declaration under Section 56 4(b)(1) of the Act, 21 U.S.C. section 360bbb-3(b)(1), unless the authorization is terminated or revoked sooner. Performed at [facility], [place] [address], [place],  [address]      Radiology Studies: No results found.  Scheduled Meds: . alum & mag hydroxide-simeth  30 mL Oral Once  . amLODipine  5 mg Oral Daily  . ARIPiprazole  5 mg Oral Daily  . atorvastatin  20 mg Oral q1800  . docusate sodium  100 mg Oral BID  . fluconazole  100 mg Oral Daily  . FLUoxetine  40 mg Oral QHS  . insulin aspart  0-9 Units Subcutaneous TID WC  . insulin aspart  15 Units Subcutaneous BID WC  . insulin glargine  50 Units Subcutaneous QHS  . lamoTRIgine  25 mg Oral BID  . metoCLOPramide  10 mg Oral TID AC & HS  . [START ON 10/15/2018] pantoprazole  40 mg Oral Daily  . potassium chloride  40 mEq Oral BID  . propranolol  10 mg Oral TID  . QUEtiapine  50 mg Oral QHS  . sodium chloride flush  3 mL Intravenous Q12H  . topiramate  25 mg Oral BID   Continuous Infusions: . sodium chloride 100 mL/hr at 10/14/18 1518  . sodium chloride       LOS: 5 days   Marylu Lund, MD [facility] Pager On Amion  If 7PM-7AM, please contact night-coverage 10/14/2018, 4:28 PM

## 2018-10-14 NOTE — Progress Notes (Signed)
Progress Note  Patient Name: Nancy [name] Date of Encounter: 10/14/2018  Primary Cardiologist: Dorris Carnes, MD   Subjective   Constant chest pain, no SOB  Inpatient Medications    Scheduled Meds: . alum & mag hydroxide-simeth  30 mL Oral Once  . amLODipine  5 mg Oral Daily  . ARIPiprazole  5 mg Oral Daily  . atorvastatin  20 mg Oral q1800  . docusate sodium  100 mg Oral BID  . fluconazole  100 mg Oral Daily  . FLUoxetine  40 mg Oral QHS  . insulin aspart  0-9 Units Subcutaneous TID WC  . insulin aspart  15 Units Subcutaneous BID WC  . insulin glargine  50 Units Subcutaneous QHS  . lamoTRIgine  25 mg Oral BID  . metoCLOPramide (REGLAN) injection  10 mg Intravenous Q6H  . pantoprazole (PROTONIX) IV  40 mg Intravenous BID  . potassium chloride  40 mEq Oral BID  . promethazine  12.5-25 mg Intravenous Q6H  . propranolol  10 mg Oral TID  . QUEtiapine  50 mg Oral QHS  . sodium chloride flush  3 mL Intravenous Q12H  . topiramate  25 mg Oral BID   Continuous Infusions: . sodium chloride 100 mL/hr at 10/14/18 0200  . sodium chloride     PRN Meds: sodium chloride, acetaminophen **OR** acetaminophen, albuterol, hydrOXYzine, labetalol, magnesium hydroxide, sodium chloride flush, traMADol   Vital Signs    Vitals:   10/13/18 2022 10/13/18 2329 10/14/18 0425 10/14/18 0803  BP: 123/72 129/89 116/82 114/73  Pulse: (!) 115  (!) 118 (!) 109  Resp: 18 (!) 21 20   Temp: 98.9 F (37.2 C) 99.1 F (37.3 C) 98.4 F (36.9 C) 98.5 F (36.9 C)  TempSrc: Oral Oral Oral Oral  SpO2: 100% 100% 100% 100%  Weight:      Height:        Intake/Output Summary (Last 24 hours) at 10/14/2018 1041 Last data filed at 10/14/2018 0845 Gross per 24 hour  Intake 1900.91 ml  Output -  Net 1900.91 ml   Last 3 Weights 10/08/2018 10/05/2018 10/01/2018  Weight (lbs) 198 lb 197 lb 197 lb  Weight (kg) 89.812 kg 89.359 kg 89.359 kg  Some encounter information is confidential and restricted. Go to  Review Flowsheets activity to see all data.      Telemetry    Sinus tachycardia, HR 100s - Personally Reviewed  ECG     sinus tachycardia, no significant ST-T wave changes- Personally Reviewed  Physical Exam   GEN: No acute distress.   Neck: No JVD Cardiac: RRR, no murmurs, rubs, or gallops.  Respiratory: Clear to auscultation bilaterally. GI: Soft, nontender, non-distended  MS: No edema; No deformity. Neuro:  Nonfocal  Psych: Normal affect   Labs    High Sensitivity Troponin:   Recent Labs  Lab 10/09/18 0500 10/13/18 0747  TROPONINIHS 2 <2      Chemistry Recent Labs  Lab 10/12/18 0238 10/13/18 0254 10/14/18 0249  NA 136 134* 134*  K 4.0 4.1 4.9  CL 107 105 103  CO2 22 22 22   GLUCOSE 184* 168* 178*  BUN <5* 10 15  CREATININE 0.59 0.66 0.50  CALCIUM 8.5* 8.4* 9.0  PROT  --   --  6.2*  ALBUMIN 3.3* 3.3* 3.3*  AST  --   --  18  ALT  --   --  22  ALKPHOS  --   --  62  BILITOT  --   --  0.4  GFRNONAA >60 >60 >60  GFRAA >60 >60 >60  ANIONGAP 7 7 9      Hematology Recent Labs  Lab 10/11/18 1022 10/12/18 0238 10/13/18 0254  WBC 7.6 9.0 12.8*  RBC 4.44 4.37 4.24  HGB 12.8 12.6 12.1  HCT 37.8 37.2 37.3  MCV 85.1 85.1 88.0  MCH 28.8 28.8 28.5  MCHC 33.9 33.9 32.4  RDW 13.2 13.4 14.0  PLT 331 351 367    BNPNo results for input(s): BNP, PROBNP in the last 168 hours.   DDimer No results for input(s): DDIMER in the last 168 hours.   Radiology    No results found.  Cardiac Studies   Echo 10/11/2018 IMPRESSIONS    1. Left ventricular ejection fraction, by visual estimation, is 60 to 65%. The left ventricle has normal function. Normal left ventricular size. There is mildly increased left ventricular hypertrophy.  2. Global right ventricle has normal systolic function.The right ventricular size is normal. No increase in right ventricular wall thickness.  3. Left atrial size was normal.  4. Right atrial size was normal.  5. Trivial pericardial  effusion is present.  6. The mitral valve is normal in structure. No evidence of mitral valve regurgitation.  7. The tricuspid valve is normal in structure. Tricuspid valve regurgitation was not visualized by color flow Doppler.  8. The aortic valve is tricuspid Aortic valve regurgitation was not visualized by color flow Doppler.  9. The pulmonic valve was grossly normal. Pulmonic valve regurgitation is not visualized by color flow Doppler. 10. The inferior vena cava is normal in size with greater than 50% respiratory variability, suggesting right atrial pressure of 3 mmHg.   Cath 10/13/2018 1. The LAD is a large caliber vessel that courses to the apex and becomes small caliber near the apex. 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 Good flow to the apex. No obvious dissection flap or contrast dye staining.  2. The Circumflex is large and has no obstructive disease. 3. The RCA is large and has no obstructive disease.   Recommendations: Cannot exclude spontaneous dissection of the distal LAD but there is no obvious dissection flap or dye staining. The very distal LAD becomes small in caliber. This could be a normal variant.  Will follow on telemetry for 48 hours.    Patient Profile     18 y.o. female with PMH of DM with DKA, chronic abdominal pain, prior syncope, depression, anxiety and seizure who was admitted after a syncope episode and found to have DKA. GI workup showed gastritis. 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   Assessment & Plan    1. Chest pain with EKG changes  - cardiac cath 10/7 showed no obstructive disease, tapering of the distal LAD cannot exclude pruning as result of dissection, however no dissection or contrast dye  staining was observed, plan to monitor for 48 hours per Dr. Clifton James.  - patient says she has been having constant chest pain since yesterday, 9/10 in intensity. However she never informed her nurse nor does she appears to be uncomfortable during the visit. Maybe GI in etiology. Consider trend troponin, unless significant jump, would not recommend further cardiac workup.   2. DKA: managed by hospitalist  3. Gastritis: confirmed on EGD, also  had mild candida esophagitis  4. Abnormal TSH: TSH 0.199, however free T4 normal.   5. Hypertension: BP controlled on amlodipine  6. Tachycardia: HR around 100-110, likely related to DKA and GI pain, unclear cause for low TSH despite normal free T4       For questions or updates, please contact [facility] Please consult [email or web address] for contact info under        Signed, Azalee Course, PA  10/14/2018, 10:41 AM

## 2018-10-14 NOTE — Progress Notes (Addendum)
Inpatient Diabetes Program Recommendations  AACE/ADA: New Consensus Statement on Inpatient Glycemic Control (2015)  Target Ranges:  Prepandial:   less than 140 mg/dL      Peak postprandial:   less than 180 mg/dL (1-2 hours)      Critically ill patients:  140 - 180 mg/dL   Lab Results  Component Value Date   GLUCAP 98 10/14/2018   HGBA1C 12.9 (A) 07/13/2018    Review of Glycemic Control Results for ADRA, SHEPLER (MRN [id number]) as of 10/14/2018 11:33  Ref. Range 10/13/2018 09:36 10/13/2018 17:26 10/13/2018 19:49 10/13/2018 20:37 10/14/2018 08:01  Glucose-Capillary Latest Ref Range: 70 - 99 mg/dL 146 (H) 196 (H) 59 (L) 122 (H) 98   Diabetes history: DM 2 Outpatient Diabetes medications: Tresiba 52 QD + Humalog 15 BID  Current orders for Inpatient glycemic control:  Lantus 50 units qhs Novolog 0-9 units tid Novolog 15 units bid with meals for meal coverage  Inpatient Diabetes Program Recommendations:    PT had hypoglycemia yesterday evening on current meal coverage dose. Unsure of percentage of meal intake. Pt needs to consumes at least 50% of meal sand glucose be at least 80 mg/dl in order to receive meal coverage.  May consider decreasing Novolog meal coverage to 10-12 units tid.  Thanks,  Tama Headings RN, MSN, BC-ADM Inpatient Diabetes Coordinator Team Pager [phone] (8a-5p)

## 2018-10-15 LAB — GLUCOSE, CAPILLARY: Glucose-Capillary: 250 mg/dL — ABNORMAL HIGH (ref 70–99)

## 2018-10-15 MED ORDER — PANTOPRAZOLE SODIUM 40 MG PO TBEC: 40.0000 mg | DELAYED_RELEASE_TABLET | Freq: Every day | ORAL | 0 refills | Status: DC

## 2018-10-15 MED ORDER — PROPRANOLOL HCL 10 MG PO TABS: 10.0000 mg | ORAL_TABLET | Freq: Three times a day (TID) | ORAL | 0 refills | Status: DC

## 2018-10-15 MED ORDER — FLUCONAZOLE 100 MG PO TABS: 100.0000 mg | ORAL_TABLET | Freq: Every day | ORAL | 0 refills | Status: DC

## 2018-10-15 MED ORDER — METOCLOPRAMIDE HCL 10 MG PO TABS: 10.0000 mg | ORAL_TABLET | Freq: Three times a day (TID) | ORAL | 0 refills | Status: DC

## 2018-10-15 MED ORDER — DICLOFENAC SODIUM 1 % TD GEL
2.0000 g | Freq: Four times a day (QID) | TRANSDERMAL | Status: DC
  Administered 2018-10-15: 2 g via TOPICAL
  Filled 2018-10-15: qty 100

## 2018-10-15 MED ORDER — AMLODIPINE BESYLATE 5 MG PO TABS: 5.0000 mg | ORAL_TABLET | Freq: Every day | ORAL | 0 refills | Status: DC

## 2018-10-15 MED FILL — METOCLOPRAMIDE 10 MG TABLET: 10 | 30 days supply | Qty: 120 | Fill #0

## 2018-10-15 MED FILL — AMLODIPINE BESYLATE 5 MG TA: 5 | 30 days supply | Qty: 30 | Fill #0

## 2018-10-15 MED FILL — PANTOPRAZOLE SOD DR 40 MG T: 40 | 30 days supply | Qty: 30 | Fill #0

## 2018-10-15 MED FILL — FLUCONAZOLE 100 MG TABLET: 100 | 12 days supply | Qty: 12 | Fill #0

## 2018-10-15 MED FILL — PROPRANOLOL 10 MG TABLET: 10 | 30 days supply | Qty: 90 | Fill #0

## 2018-10-15 NOTE — Discharge Summary (Signed)
Physician Discharge Summary  Nancy [name]:[id number] DOB: 2000/03/26 DOA: 10/08/2018  PCP: Claiborne Rigg, NP  Admit date: 10/08/2018 Discharge date: 10/15/2018  Admitted From: Home Disposition:  Home  Recommendations for Outpatient Follow-up:  1. Follow up with PCP in 1-2 weeks 2. 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 Reportedly, the patient was found down on the floor by family and was unresponsive. She does not recall how she ended up on the floor, but states that she has been having seizures. However, appears to have been taken off Keppra because it was "too strong". Complains of throwing up blood and her "stomach has been all messed up". Episodes of emesis werenoted to be bright red and coffee-ground discusses previous evaluations by [facility] where she notes being worked up and told that she did not have gastroparesis. Associated symptoms include constipation. Patient still reports being able to pass flatus.She had been taking her Lantus 52 units at night, but reports that she had not been taking the Humalog with meals because she was unable to keep any significant amount of food down.  Discharge Diagnoses:  Principal Problem:   MDD (major depressive disorder), recurrent severe, without psychosis (HCC) Active Problems:   Obesity   Nausea and vomiting   Generalized abdominal pain   Chronic abdominal pain   Conversion disorder with attacks or seizures, acute episode, with psychological stressor   DKA, type 1 (HCC)   Hypertensive urgency   Nonspecific abnormal electrocardiogram (ECG) (EKG)   Chest  pain of uncertain etiology   DKA: Acute on chronic  - Patient presented with elevated blood glucose of 355 with CO2 21 and anion gap 16.  - Continue novolog 15 units BID WM, lantus50units daily while in hospital - DKA resolved. Glucose trends remain stable -Resume home meds on d/c  Abdominal pain: Acute on chronic.  - chronic pain; PRN pain control. -lipase normal - GI onboard, appreciate assistance - EGD: mild candida and gastritis; diflucan started -On my own read of CT abd, stool noted throughout colon in ascending, transverse, and descending colons, most prominently in splenic flexure  -No results with Mg citrate with mild results following soap suds enema -seems improved. Of note, pt has prior documentation of chronic abdominal pain due to suspected somatoform disorder on 09/15/2018 discharge summary  Nausea, vomiting/hematemesis: - followed by [facility] - has been on "every" nausea medicine per her report; and nothing works - IV reglan, protonix - See above, findings of stool per my read of CT abd.  - improvement following cathartics  Hypertensive urgency: Acute.  - Blood pressures elevated up to 161/106 on admission.  - Labetalol IV as needed - Continued on amlodipine 5 mg PO with 10mg  propranalol TID -BP remains stable  Sinus tachycardia: Acute on chronic.  - Heart rates into the 120s on admission.  - Patient previously was on propranolol in the past. - fluids, monitor -check TSH/FT4: TSH suppressed, FT4 is normal; check FT3 - Continue on propranolol 10 TID   Anxiety/depression:  - Patient follows in outpatient setting with [facility].  - She was seen by PCP 3 days ago, and Lexapro was recommended to be discontinued by PCP and Seroquel was stopped. - Continue Abilify, Prozac, Lamictal, and  hydroxyzine as needed - Continue outpatient follow-up with [facility] on October  19 -remained stable  Psychogenic seizures:  - Evaluated and previously noted not to have epileptiform seizures.  - Remains stable  Obesity:  - BMI 35.08 kg/m - recommend diet/lifestyle   Constipation - colace, cathartics given  Syncopal episode - no head injury reported.  - 2d echo with normal LVEF - Not orthostatic -On 10/7 pt noted to have L sided chest pains with new ST elevations -Cardiology consulted and pt now s/p heart cath 10/7 with patent coronaries -Cardiology recommends repeat trop, if neg, then no further cardiac w/u. Trop is neg  Discharge Instructions   Allergies as of 10/15/2018      Reactions   Ibuprofen Other (See Comments)   GI MD said to not take this anymore      Medication List    TAKE these medications   amLODipine 5 MG tablet Commonly known as: NORVASC Take 1 tablet (5 mg total) by mouth daily. Start taking on: October 16, 2018   ARIPiprazole 20 MG tablet Commonly known as: ABILIFY Take 20 mg by mouth daily.   atorvastatin 20 MG tablet Commonly known as: LIPITOR Take 1 tablet (20 mg total) by mouth daily at 6 PM.   fluconazole 100 MG tablet Commonly known as: DIFLUCAN Take 1 tablet (100 mg total) by mouth daily for 12 days. Start taking on: October 16, 2018   FLUoxetine 40 MG capsule Commonly known as: PROZAC Take 1 capsule (40 mg total) by mouth at bedtime.   glucagon 1 MG injection Use for Severe Hypoglycemia . Inject 1 mg intramuscularly if unresponsive, unable to swallow, unconscious and/or has seizure What changed:   how much to take  how to take this  when to take this  reasons to take this  additional instructions   hydrOXYzine 25 MG tablet Commonly known as: ATARAX/VISTARIL Take 1 tablet (25 mg total) by mouth 3 (three) times daily as needed for anxiety.   insulin lispro 100 UNIT/ML injection Commonly known as: HUMALOG Inject 0.15 mLs (15 Units total) into the skin 2 (two) times  daily with a meal.   lamoTRIgine 25 MG tablet Commonly known as: LAMICTAL Take 1 tablet (25 mg total) by mouth 2 (two) times daily.   metoCLOPramide 10 MG tablet Commonly known as: REGLAN Take 1 tablet (10 mg total) by mouth 4 (four) times daily -  before meals and at bedtime.   multivitamin with minerals Tabs tablet Take 1 tablet by mouth daily.   pantoprazole 40 MG tablet Commonly known as: PROTONIX Take 1 tablet (40 mg total) by mouth daily. Start taking on: October 16, 2018   promethazine 25 MG tablet Commonly known as: PHENERGAN Take 1 tablet (25 mg total) by mouth every 8 (eight) hours as needed for nausea or vomiting.   propranolol 10 MG tablet Commonly known as: INDERAL Take 1 tablet (10 mg total) by mouth 3 (three) times daily.   topiramate 25 MG tablet Commonly known as: TOPAMAX Take 1 tablet (25 mg total) by mouth 2 (two) times daily.   traZODone 100 MG tablet Commonly known as: DESYREL Take 3 tablets (300 mg total) by mouth at bedtime.   Tresiba 100 UNIT/ML Soln Generic drug: Insulin Degludec Inject 52 Units into the skin daily.      Follow-up Information    [facility] Follow up.   Contact information: [address] [place] [address] [phone]  [name], NP. Schedule an appointment as soon as possible for a visit in 1 week(s).   Specialty: Nurse Practitioner Contact information: [place] [address] [phone]        Fay Records, MD .   Specialty: Cardiology Contact information: [place] [address] [phone]        Yetta Flock, MD. Schedule an appointment as soon as possible for a visit in 6 week(s).   Specialty: Gastroenterology Contact information: [place] [address] [place] [address] [phone]          Allergies  Allergen Reactions  . Ibuprofen Other (See Comments)    GI MD  said to not take this anymore    Consultations:  GI  Cardiology  Procedures/Studies: Ct Abdomen Pelvis W Contrast  Result Date: 10/08/2018 CLINICAL DATA:  Syncope in general abdominal pain today. The patient reports coffee ground emesis. EXAM: CT ABDOMEN AND PELVIS WITH CONTRAST TECHNIQUE: Multidetector CT imaging of the abdomen and pelvis was performed using the standard protocol following bolus administration of intravenous contrast. CONTRAST:  100 mL OMNIPAQUE IOHEXOL 300 MG/ML  SOLN COMPARISON:  CT abdomen and pelvis 07/25/2018. FINDINGS: Lower chest: Lung bases clear.  No pleural or pericardial effusion. Hepatobiliary: No focal liver abnormality is seen. No gallstones, gallbladder wall thickening, or biliary dilatation. Pancreas: Unremarkable. No pancreatic ductal dilatation or surrounding inflammatory changes. Spleen: Normal in size without focal abnormality. Adrenals/Urinary Tract: Adrenal glands are unremarkable. Kidneys are normal, without renal calculi, focal lesion, or hydronephrosis. Bladder is unremarkable. Stomach/Bowel: Adrenal glands are unremarkable. Kidneys are normal, without renal calculi, focal lesion, or hydronephrosis. Bladder is unremarkable. Vascular/Lymphatic: No significant vascular findings are present. No enlarged abdominal or pelvic lymph nodes. Reproductive: Uterus and bilateral adnexa are unremarkable. A cystic lesion in the superior aspect of the vagina on the right measuring 1.8 cm AP x 1.3 cm transverse x 0.9 cm craniocaudal is unchanged. Other: None. Musculoskeletal: Negative. IMPRESSION: No acute abnormality abdomen or pelvis. No finding to explain the patient's symptoms. 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 Electronically Signed   By: Inge Rise M.D.   On: 10/08/2018 13:33     Subjective: Eager to go home  Discharge Exam: Vitals:   10/15/18 0838 10/15/18 1005  BP: (!) 104/58   Pulse: (!) 112    Resp:  14  Temp:    SpO2: 100%    Vitals:   10/15/18 0020 10/15/18 0306 10/15/18 0838 10/15/18 1005  BP: (!) 102/55  (!) 104/58   Pulse: (!) 116  (!) 112   Resp: 16   14  Temp: 98.6 F (37 C)     TempSrc: Oral     SpO2: 100%  100%   Weight:  92 kg    Height:        General: Pt is alert, awake, not in acute distress Cardiovascular: RRR, S1/S2 +, no rubs, no gallops Respiratory: CTA bilaterally, no wheezing, no rhonchi Abdominal: Soft, NT, ND, bowel sounds + Extremities: no edema, no cyanosis   The results of significant diagnostics from this hospitalization (including imaging, microbiology, ancillary and laboratory) are listed below for reference.     Microbiology: Recent Results (from the past 240 hour(s))  MRSA PCR Screening     Status: None   Collection Time: 10/10/18  8:51 AM   Specimen: Nasal Mucosa; Nasopharyngeal  Result Value Ref  Range Status   MRSA by PCR NEGATIVE NEGATIVE Final    Comment:        The GeneXpert MRSA Assay (FDA approved for NASAL specimens only), is one component of a comprehensive MRSA colonization surveillance program. It is not intended to diagnose MRSA infection nor to guide or monitor treatment for MRSA infections. Performed at [facility], [address] [place], [place] [address]   SARS CORONAVIRUS 2 (TAT 6-24 HRS) Nasopharyngeal Nasopharyngeal Swab     Status: None   Collection Time: 10/11/18  3:18 PM   Specimen: Nasopharyngeal Swab  Result Value Ref Range Status   SARS Coronavirus 2 NEGATIVE NEGATIVE Final    Comment: (NOTE) SARS-CoV-2 target nucleic acids are NOT DETECTED. The SARS-CoV-2 RNA is generally detectable in upper and lower respiratory specimens during the acute phase of infection. Negative results do not preclude SARS-CoV-2 infection, do not rule out co-infections with other pathogens, and should not be used as the sole basis for treatment or other patient management decisions. Negative results must be  combined with clinical observations, patient history, and epidemiological information. The expected result is Negative. Fact Sheet for Patients: HairSlick.nohttps://www.fda.gov/media/138098/download Fact Sheet for Healthcare Providers: quierodirigir.comhttps://www.fda.gov/media/138095/download This test is not yet approved or cleared by the [place] FDA and  has been authorized for detection and/or diagnosis of SARS-CoV-2 by FDA under an Emergency Use Authorization (EUA). This EUA will remain  in effect (meaning this test can be used) for the duration of the COVID-19 declaration under Section 56 4(b)(1) of the Act, 21 U.S.C. section 360bbb-3(b)(1), unless the authorization is terminated or revoked sooner. Performed at [facility], [address] [place], [place] [address]      Labs: BNP (last 3 results) Recent Labs    01/29/18 2134 09/10/18 1424 09/12/18 2244  BNP 12.4 13.1 13.8   Basic Metabolic Panel: Recent Labs  Lab 10/10/18 0754 10/11/18 1022 10/12/18 0238 10/13/18 0254 10/14/18 0249  NA 137 137 136 134* 134*  K 3.3* 3.5 4.0 4.1 4.9  CL 107 106 107 105 103  CO2 20* 22 22 22 22   GLUCOSE 184* 169* 184* 168* 178*  BUN <5* <5* <5* 10 15  CREATININE 0.40* 0.46 0.59 0.66 0.50  CALCIUM 8.3* 8.7* 8.5* 8.4* 9.0  MG 2.1 2.0 2.1 2.0 2.3  PHOS 1.9* 2.4* 2.5 3.3  --    Liver Function Tests: Recent Labs  Lab 10/10/18 0754 10/11/18 1022 10/12/18 0238 10/13/18 0254 10/14/18 0249  AST  --   --   --   --  18  ALT  --   --   --   --  22  ALKPHOS  --   --   --   --  62  BILITOT  --   --   --   --  0.4  PROT  --   --   --   --  6.2*  ALBUMIN 3.5 3.7 3.3* 3.3* 3.3*   Recent Labs  Lab 10/08/18 1104 10/11/18 1609  LIPASE 20 22   No results for input(s): AMMONIA in the last 168 hours. CBC: Recent Labs  Lab 10/09/18 0500  10/10/18 0754 10/10/18 1500 10/10/18 2144 10/11/18 1022 10/12/18 0238 10/13/18 0254  WBC 7.8  --  6.4  --   --  7.6 9.0 12.8*  NEUTROABS  --   --  4.4   --   --  5.0 4.8 6.6  HGB 11.0*   < > 12.5 12.3 12.3 12.8  12.6 12.1  HCT 33.6*   < > 37.0 37.7 37.6 37.8 37.2 37.3  MCV 88.4  --  86.4  --   --  85.1 85.1 88.0  PLT 291  --  312  --   --  331 351 367   < > = values in this interval not displayed.   Cardiac Enzymes: No results for input(s): CKTOTAL, CKMB, CKMBINDEX, TROPONINI in the last 168 hours. BNP: Invalid input(s): POCBNP CBG: Recent Labs  Lab 10/14/18 0801 10/14/18 1153 10/14/18 1659 10/14/18 2228 10/15/18 0853  GLUCAP 98 140* 236* 203* 250*   D-Dimer No results for input(s): DDIMER in the last 72 hours. Hgb A1c No results for input(s): HGBA1C in the last 72 hours. Lipid Profile No results for input(s): CHOL, HDL, LDLCALC, TRIG, CHOLHDL, LDLDIRECT in the last 72 hours. Thyroid function studies Recent Labs    10/12/18 1300  T3FREE 2.5   Anemia work up No results for input(s): VITAMINB12, FOLATE, FERRITIN, TIBC, IRON, RETICCTPCT in the last 72 hours. Urinalysis    Component Value Date/Time   COLORURINE YELLOW 10/08/2018 1426   APPEARANCEUR CLEAR 10/08/2018 1426   LABSPEC >1.046 (H) 10/08/2018 1426   PHURINE 7.0 10/08/2018 1426   GLUCOSEU >=500 (A) 10/08/2018 1426   HGBUR NEGATIVE 10/08/2018 1426   BILIRUBINUR NEGATIVE 10/08/2018 1426   BILIRUBINUR negative 07/13/2018 1651   KETONESUR 20 (A) 10/08/2018 1426   PROTEINUR NEGATIVE 10/08/2018 1426   UROBILINOGEN 0.2 07/13/2018 1651   UROBILINOGEN 1.0 07/09/2017 1324   NITRITE NEGATIVE 10/08/2018 1426   LEUKOCYTESUR NEGATIVE 10/08/2018 1426   Sepsis Labs Invalid input(s): PROCALCITONIN,  WBC,  LACTICIDVEN Microbiology Recent Results (from the past 240 hour(s))  MRSA PCR Screening     Status: None   Collection Time: 10/10/18  8:51 AM   Specimen: Nasal Mucosa; Nasopharyngeal  Result Value Ref Range Status   MRSA by PCR NEGATIVE NEGATIVE Final    Comment:        The GeneXpert MRSA Assay (FDA approved for NASAL specimens only), is one component of  a comprehensive MRSA colonization surveillance program. It is not intended to diagnose MRSA infection nor to guide or monitor treatment for MRSA infections. Performed at [facility], [address] [place], [place] [address]   SARS CORONAVIRUS 2 (TAT 6-24 HRS) Nasopharyngeal Nasopharyngeal Swab     Status: None   Collection Time: 10/11/18  3:18 PM   Specimen: Nasopharyngeal Swab  Result Value Ref Range Status   SARS Coronavirus 2 NEGATIVE NEGATIVE Final    Comment: (NOTE) SARS-CoV-2 target nucleic acids are NOT DETECTED. The SARS-CoV-2 RNA is generally detectable in upper and lower respiratory specimens during the acute phase of infection. Negative results do not preclude SARS-CoV-2 infection, do not rule out co-infections with other pathogens, and should not be used as the sole basis for treatment or other patient management decisions. Negative results must be combined with clinical observations, patient history, and epidemiological information. The expected result is Negative. Fact Sheet for Patients: HairSlick.no Fact Sheet for Healthcare Providers: quierodirigir.com This test is not yet approved or cleared by the [place] FDA and  has been authorized for detection and/or diagnosis of SARS-CoV-2 by FDA under an Emergency Use Authorization (EUA). This EUA will remain  in effect (meaning this test can be used) for the duration of the COVID-19 declaration under Section 56 4(b)(1) of the Act, 21 U.S.C. section 360bbb-3(b)(1), unless the authorization is terminated or revoked sooner. Performed at [facility],  [address] [place], [place] [address]    Time spent: 30 min  SIGNED:   Rickey BarbaraStephen Swan Zayed, MD  [facility] 10/15/2018, 10:43 AM  If 7PM-7AM, please contact night-coverage

## 2018-10-15 NOTE — Progress Notes (Signed)
Nsg Discharge Note  Admit Date:  10/08/2018 Discharge date: 10/15/2018   [name] to be D/C'd Home per MD order.  AVS completed.  Copy for chart, and copy for patient signed, and dated. Patient/caregiver able to verbalize understanding.  Discharge Medication: Allergies as of 10/15/2018      Reactions   Ibuprofen Other (See Comments)   GI MD said to not take this anymore      Medication List    TAKE these medications   amLODipine 5 MG tablet Commonly known as: NORVASC Take 1 tablet (5 mg total) by mouth daily. Start taking on: October 16, 2018   ARIPiprazole 20 MG tablet Commonly known as: ABILIFY Take 20 mg by mouth daily.   atorvastatin 20 MG tablet Commonly known as: LIPITOR Take 1 tablet (20 mg total) by mouth daily at 6 PM.   fluconazole 100 MG tablet Commonly known as: DIFLUCAN Take 1 tablet (100 mg total) by mouth daily for 12 days. Start taking on: October 16, 2018   FLUoxetine 40 MG capsule Commonly known as: PROZAC Take 1 capsule (40 mg total) by mouth at bedtime.   glucagon 1 MG injection Use for Severe Hypoglycemia . Inject 1 mg intramuscularly if unresponsive, unable to swallow, unconscious and/or has seizure What changed:   how much to take  how to take this  when to take this  reasons to take this  additional instructions   hydrOXYzine 25 MG tablet Commonly known as: ATARAX/VISTARIL Take 1 tablet (25 mg total) by mouth 3 (three) times daily as needed for anxiety.   insulin lispro 100 UNIT/ML injection Commonly known as: HUMALOG Inject 0.15 mLs (15 Units total) into the skin 2 (two) times daily with a meal.   lamoTRIgine 25 MG tablet Commonly known as: LAMICTAL Take 1 tablet (25 mg total) by mouth 2 (two) times daily.   metoCLOPramide 10 MG tablet Commonly known as: REGLAN Take 1 tablet (10 mg total) by mouth 4 (four) times daily -  before meals and at bedtime.   multivitamin with minerals Tabs tablet Take 1 tablet by mouth  daily.   pantoprazole 40 MG tablet Commonly known as: PROTONIX Take 1 tablet (40 mg total) by mouth daily. Start taking on: October 16, 2018   promethazine 25 MG tablet Commonly known as: PHENERGAN Take 1 tablet (25 mg total) by mouth every 8 (eight) hours as needed for nausea or vomiting.   propranolol 10 MG tablet Commonly known as: INDERAL Take 1 tablet (10 mg total) by mouth 3 (three) times daily.   topiramate 25 MG tablet Commonly known as: TOPAMAX Take 1 tablet (25 mg total) by mouth 2 (two) times daily.   traZODone 100 MG tablet Commonly known as: DESYREL Take 3 tablets (300 mg total) by mouth at bedtime.   Tresiba 100 UNIT/ML Soln Generic drug: Insulin Degludec Inject 52 Units into the skin daily.       Discharge Assessment: Vitals:   10/15/18 0838 10/15/18 1005  BP: (!) 104/58   Pulse: (!) 112   Resp:  14  Temp:    SpO2: 100%    Skin clean, dry and intact without evidence of skin break down, no evidence of skin tears noted. IV catheter discontinued intact. Site without signs and symptoms of complications - no redness or edema noted at insertion site, patient denies c/o pain - only slight tenderness at site.  Dressing with slight pressure applied.  D/c Instructions-Education: Discharge instructions given to patient/family with verbalized understanding.  D/c education completed with patient/family including follow up instructions, medication list, d/c activities limitations if indicated, with other d/c instructions as indicated by MD - patient able to verbalize understanding, all questions fully answered. Patient instructed to return to ED, call 911, or call MD for any changes in condition.  Patient escorted via WC, and D/C home via private auto.  Boykin Nearing, RN 10/15/2018 11:01 AM

## 2018-10-16 ENCOUNTER — Emergency Department (HOSPITAL_COMMUNITY)
Admission: EM | Admit: 2018-10-16 | Discharge: 2018-10-16 | Disposition: A | Discharge status: Home / Self Care | Payer: Medicaid Other | Attending: Emergency Medicine | Admitting: Emergency Medicine

## 2018-10-16 DIAGNOSIS — R1084 Generalized abdominal pain: Secondary | ICD-10-CM | POA: Diagnosis not present

## 2018-10-16 DIAGNOSIS — R739 Hyperglycemia, unspecified: Secondary | ICD-10-CM

## 2018-10-16 DIAGNOSIS — R531 Weakness: Secondary | ICD-10-CM | POA: Diagnosis not present

## 2018-10-16 DIAGNOSIS — E1165 Type 2 diabetes mellitus with hyperglycemia: Secondary | ICD-10-CM | POA: Diagnosis not present

## 2018-10-16 DIAGNOSIS — R112 Nausea with vomiting, unspecified: Secondary | ICD-10-CM | POA: Diagnosis not present

## 2018-10-16 DIAGNOSIS — I509 Heart failure, unspecified: Secondary | ICD-10-CM | POA: Insufficient documentation

## 2018-10-16 DIAGNOSIS — R0789 Other chest pain: Secondary | ICD-10-CM | POA: Insufficient documentation

## 2018-10-16 DIAGNOSIS — Z794 Long term (current) use of insulin: Secondary | ICD-10-CM | POA: Insufficient documentation

## 2018-10-16 DIAGNOSIS — R61 Generalized hyperhidrosis: Secondary | ICD-10-CM | POA: Insufficient documentation

## 2018-10-16 DIAGNOSIS — R000 Tachycardia, unspecified: Secondary | ICD-10-CM | POA: Diagnosis not present

## 2018-10-16 DIAGNOSIS — I110 Hypertensive heart disease with heart failure: Secondary | ICD-10-CM | POA: Diagnosis not present

## 2018-10-16 DIAGNOSIS — Z79899 Other long term (current) drug therapy: Secondary | ICD-10-CM | POA: Insufficient documentation

## 2018-10-16 LAB — CBC
HCT: 38.1 % (ref 36.0–46.0)
Hemoglobin: 12.9 g/dL (ref 12.0–15.0)
MCH: 28.9 pg (ref 26.0–34.0)
MCHC: 33.9 g/dL (ref 30.0–36.0)
MCV: 85.4 fL (ref 80.0–100.0)
Platelets: 380 10*3/uL (ref 150–400)
RBC: 4.46 MIL/uL (ref 3.87–5.11)
RDW: 13.4 % (ref 11.5–15.5)
WBC: 10.4 10*3/uL (ref 4.0–10.5)
nRBC: 0 % (ref 0.0–0.2)

## 2018-10-16 LAB — COMPREHENSIVE METABOLIC PANEL
ALT: 36 U/L (ref 0–44)
AST: 26 U/L (ref 15–41)
Albumin: 4.1 g/dL (ref 3.5–5.0)
Alkaline Phosphatase: 70 U/L (ref 38–126)
Anion gap: 13 (ref 5–15)
BUN: 12 mg/dL (ref 6–20)
CO2: 20 mmol/L — ABNORMAL LOW (ref 22–32)
Calcium: 9.3 mg/dL (ref 8.9–10.3)
Chloride: 99 mmol/L (ref 98–111)
Creatinine, Ser: 0.6 mg/dL (ref 0.44–1.00)
GFR calc Af Amer: >60 mL/min (ref >60)
GFR calc non Af Amer: >60 mL/min (ref >60)
Glucose, Bld: 281 mg/dL — ABNORMAL HIGH (ref 70–99)
Potassium: 4.2 mmol/L (ref 3.5–5.1)
Sodium: 132 mmol/L — ABNORMAL LOW (ref 135–145)
Total Bilirubin: 0.3 mg/dL (ref 0.3–1.2)
Total Protein: 7.2 g/dL (ref 6.5–8.1)

## 2018-10-16 LAB — I-STAT BETA HCG BLOOD, ED (MC, WL, AP ONLY): I-stat hCG, quantitative: <5 m[IU]/mL (ref <5)

## 2018-10-16 LAB — TROPONIN I (HIGH SENSITIVITY): Troponin I (High Sensitivity): <2 ng/L (ref <18)

## 2018-10-16 LAB — LIPASE, BLOOD: Lipase: 22 U/L (ref 11–51)

## 2018-10-16 MED ORDER — MORPHINE SULFATE (PF) 4 MG/ML IV SOLN
4.0000 mg | Freq: Once | INTRAVENOUS | Status: AC
  Administered 2018-10-16: 4 mg via INTRAVENOUS
  Filled 2018-10-16: qty 1

## 2018-10-16 MED ORDER — DIPHENHYDRAMINE HCL 50 MG/ML IJ SOLN
12.5000 mg | Freq: Once | INTRAMUSCULAR | Status: AC
  Administered 2018-10-16: 12.5 mg via INTRAVENOUS
  Filled 2018-10-16: qty 1

## 2018-10-16 MED ORDER — SODIUM CHLORIDE 0.9 % IV BOLUS
1000.0000 mL | Freq: Once | INTRAVENOUS | Status: AC
  Administered 2018-10-16: 1000 mL via INTRAVENOUS

## 2018-10-16 MED ORDER — PROMETHAZINE HCL 25 MG RE SUPP: 25.0000 mg | Freq: Four times a day (QID) | RECTAL | 0 refills | Status: DC | PRN

## 2018-10-16 MED ORDER — METOCLOPRAMIDE HCL 5 MG/ML IJ SOLN
10.0000 mg | Freq: Once | INTRAMUSCULAR | Status: AC
  Administered 2018-10-16: 10 mg via INTRAVENOUS
  Filled 2018-10-16: qty 2

## 2018-10-16 MED ORDER — PROMETHAZINE HCL 25 MG/ML IJ SOLN
12.5000 mg | Freq: Once | INTRAMUSCULAR | Status: AC
  Administered 2018-10-16: 17:00:00 12.5 mg via INTRAVENOUS
  Filled 2018-10-16: qty 1

## 2018-10-16 MED ORDER — PANTOPRAZOLE SODIUM 40 MG IV SOLR
40.0000 mg | Freq: Once | INTRAVENOUS | Status: AC
  Administered 2018-10-16: 40 mg via INTRAVENOUS
  Filled 2018-10-16: qty 40

## 2018-10-16 NOTE — ED Provider Notes (Signed)
[facility] EMERGENCY DEPARTMENT Provider Note   CSN: [id number] Arrival date & time: 10/16/18  1222     History   Chief Complaint Chief Complaint  Patient presents with   Emesis    HPI Nancy [name] is a 18 y.o. 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  Onset of symptoms 2 weeks ago.  Was admitted to the hospital for this and discharged yesterday.  States while in the hospital she continued to have nausea vomiting chest pain and abdominal pain but doctors discharged her anyway even though she did not feel any better.  She was discharged and arrived home yesterday at around 11:30 AM and 20 minutes after she got home she started vomiting again.  She has vomited at least 15 times and states there is blood in it described as bright red blood and also dark liquid coming out.  Her chest pain has been in the left side of her chest for 2 weeks but it feels worse today than usual.  It is worse with active vomiting.  She feels weak but has no SOB. Her insulin regimen includes 52 units of Lantus nightly and 15 units of Humalog with meals.  States she has not been doing this at home because she is not eating or drinking as well due to the vomiting.  No fever, diarrhea, constipation, melena, dysuria. She is requesting something for pain and nausea. She tried reglan and zofran at home without relief, states phenergan is the only thing that helps her. Denies ETOH, NSAID or marijuana use.      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)    Syncope 01/30/2018   Orthostatic hypotension 01/24/2018   Chronic abdominal pain 12/24/2017   Chest pain 12/19/2017   Nausea and vomiting 08/21/2017   Generalized abdominal pain 08/21/2017   Non compliance with medical treatment 01/27/2012   Adjustment disorder 09/16/2011   DM (diabetes mellitus), type 1, uncontrolled ([place]) 09/15/2011   Acanthosis nigricans    Goiter    Obesity 06/14/2010   Hypertension 06/14/2010    Past Surgical History:  Procedure Laterality Date   ABDOMINAL HERNIA REPAIR     "I was a baby"   BIOPSY  10/12/2018   Procedure: BIOPSY;  Surgeon: Jackquline Denmark, MD;  Location: [facility] ENDOSCOPY;  Service: Endoscopy;;   ESOPHAGOGASTRODUODENOSCOPY (EGD) WITH PROPOFOL N/A 10/12/2018  Procedure: ESOPHAGOGASTRODUODENOSCOPY (EGD) WITH PROPOFOL;  Surgeon: Lynann BolognaGupta, Rajesh, MD;  [facility];  Service: Endoscopy;  Laterality: N/A;   HERNIA REPAIR     LEFT HEART CATH AND CORONARY ANGIOGRAPHY N/A 10/13/2018   Procedure: LEFT HEART CATH AND CORONARY ANGIOGRAPHY;  Surgeon: Kathleene HazelMcAlhany, Christopher D, MD;  Location: [facility] INVASIVE CV LAB;  Service: Cardiovascular;  Laterality: N/A;   TONSILLECTOMY AND ADENOIDECTOMY     WISDOM TOOTH EXTRACTION  2017     OB History   No obstetric history on file.      Home Medications    Prior to  Admission medications   Medication Sig Start Date End Date Taking? Authorizing Provider  amLODipine (NORVASC) 5 MG tablet Take 1 tablet (5 mg total) by mouth daily. 10/16/18 11/15/18  Jerald Kiefhiu, Stephen K, MD  ARIPiprazole (ABILIFY) 20 MG tablet Take 20 mg by mouth daily.    [provider]  atorvastatin (LIPITOR) 20 MG tablet Take 1 tablet (20 mg total) by mouth daily at 6 PM. 10/05/18   Storm FriskWright, Patrick E, MD  fluconazole (DIFLUCAN) 100 MG tablet Take 1 tablet (100 mg total) by mouth daily for 12 days. 10/16/18 10/28/18  Jerald Kiefhiu, Stephen K, MD  FLUoxetine (PROZAC) 40 MG capsule Take 1 capsule (40 mg total) by mouth at bedtime. 10/05/18   Storm FriskWright, Patrick E, MD  glucagon 1 MG injection Use for Severe Hypoglycemia . Inject 1 mg intramuscularly if unresponsive, unable to swallow, unconscious and/or has seizure Patient taking differently: Inject 1 mg into the muscle once as needed (for severe hypoglycemia- if unresponsive, unable to swallow, unconscious, and/or has had seizure(s)).  07/20/13 12/19/25  Romero BellingEllison, Sean, MD  hydrOXYzine (ATARAX/VISTARIL) 25 MG tablet Take 1 tablet (25 mg total) by mouth 3 (three) times daily as needed for anxiety. 10/05/18   Storm FriskWright, Patrick E, MD  Insulin Degludec (TRESIBA) 100 UNIT/ML SOLN Inject 52 Units into the skin daily. 10/05/18   Storm FriskWright, Patrick E, MD  insulin lispro (HUMALOG) 100 UNIT/ML injection Inject 0.15 mLs (15 Units total) into the skin 2 (two) times daily with a meal. 10/05/18   Storm FriskWright, Patrick E, MD  lamoTRIgine (LAMICTAL) 25 MG tablet Take 1 tablet (25 mg total) by mouth 2 (two) times daily. 10/05/18   Storm FriskWright, Patrick E, MD  metoCLOPramide (REGLAN) 10 MG tablet Take 1 tablet (10 mg total) by mouth 4 (four) times daily -  before meals and at bedtime. 10/15/18 11/14/18  Jerald Kiefhiu, Stephen K, MD  Multiple Vitamin (MULTIVITAMIN WITH MINERALS) TABS tablet Take 1 tablet by mouth daily. 09/18/18   Clapacs, Jackquline DenmarkJohn T, MD  pantoprazole (PROTONIX) 40 MG tablet Take 1 tablet (40 mg  total) by mouth daily. 10/16/18 11/15/18  Jerald Kiefhiu, Stephen K, MD  promethazine (PHENERGAN) 25 MG suppository Place 1 suppository (25 mg total) rectally every 6 (six) hours as needed for nausea or vomiting. 10/16/18   Liberty HandyGibbons, Hawley Pavia J, PA-C  propranolol (INDERAL) 10 MG tablet Take 1 tablet (10 mg total) by mouth 3 (three) times daily. 10/15/18 11/14/18  Jerald Kiefhiu, Stephen K, MD  topiramate (TOPAMAX) 25 MG tablet Take 1 tablet (25 mg total) by mouth 2 (two) times daily. 10/05/18   Storm FriskWright, Patrick E, MD  traZODone (DESYREL) 100 MG tablet Take 3 tablets (300 mg total) by mouth at bedtime. 10/05/18   Hoy RegisterNewlin, Enobong, MD    Family History Family History  Problem Relation Age of Onset   Diabetes Mother    Hypertension Mother    Obesity Mother    Asthma Mother  Allergic rhinitis Mother    Eczema Mother    Diabetes Father    Hypertension Father    Obesity Father    Hyperlipidemia Father    Hypertension Paternal Aunt    Hypertension Maternal Grandfather    Colon cancer Maternal Grandfather    Diabetes Paternal Grandmother    Obesity Paternal Grandmother    Diabetes Paternal Grandfather    Obesity Paternal Grandfather    Angioedema Neg Hx    Immunodeficiency Neg Hx    Urticaria Neg Hx    Stomach cancer Neg Hx    Esophageal cancer Neg Hx     Social History Social History   Tobacco Use   Smoking status: Never Smoker   Smokeless tobacco: Never Used  Substance Use Topics   Alcohol use: No    Alcohol/week: 0.0 standard drinks   Drug use: No     Allergies   Ibuprofen   Review of Systems Review of Systems  Cardiovascular: Positive for chest pain.  Gastrointestinal: Positive for abdominal pain, nausea and vomiting.  Neurological: Positive for weakness (generalized).  All other systems reviewed and are negative.    Physical Exam Updated Vital Signs BP (!) 111/57    Pulse (!) 108    Temp 98.4 F (36.9 C) (Oral)    Resp 14    LMP 09/27/2018 (Approximate)     SpO2 100%   Physical Exam Vitals signs and nursing note reviewed.  Constitutional:      Appearance: She is well-developed.     Comments: Non toxic in NAD  HENT:     Head: Normocephalic and atraumatic.     Nose: Nose normal.     Mouth/Throat:     Comments: Moist mucous membranes. Eyes:     Conjunctiva/sclera: Conjunctivae normal.  Neck:     Musculoskeletal: Normal range of motion.  Cardiovascular:     Rate and Rhythm: Normal rate and regular rhythm.  Pulmonary:     Effort: Pulmonary effort is normal.     Breath sounds: Normal breath sounds.  Abdominal:     General: Bowel sounds are normal.     Palpations: Abdomen is soft.     Tenderness: There is abdominal tenderness (Generalized).     Comments: No G/R/R. No suprapubic or CVA tenderness. Negative Murphy's and McBurney's. Active BS to lower quadrants.   Musculoskeletal: Normal range of motion.  Skin:    General: Skin is warm and dry.     Capillary Refill: Capillary refill takes less than 2 seconds.  Neurological:     Mental Status: She is alert.  Psychiatric:        Behavior: Behavior normal.      ED Treatments / Results  Labs (all labs ordered are listed, but only abnormal results are displayed) Labs Reviewed  COMPREHENSIVE METABOLIC PANEL - Abnormal; Notable for the following components:      Result Value   Sodium 132 (*)    CO2 20 (*)    Glucose, Bld 281 (*)    All other components within normal limits  LIPASE, BLOOD  CBC  URINALYSIS, ROUTINE W REFLEX MICROSCOPIC  I-STAT BETA HCG BLOOD, ED ([facility], [facility], [facility] ONLY)  TROPONIN I (HIGH SENSITIVITY)    EKG EKG Interpretation  Date/Time:  Saturday October 16 2018 13:36:34 EDT Ventricular Rate:  121 PR Interval:    QRS Duration: 87 QT Interval:  345 QTC Calculation: 490 R Axis:   32 Text Interpretation:  Sinus tachycardia ST elev, probable normal early repol pattern Borderline  prolonged QT interval No significant change since 10/11/2018 Confirmed by Geoffery LyonseLo, Douglas  [phone]([id number]) on 10/16/2018 1:39:12 PM   Radiology No results found.  Procedures Procedures (including critical care time)  Medications Ordered in ED Medications  sodium chloride 0.9 % bolus 1,000 mL (0 mLs Intravenous Stopped 10/16/18 1543)  metoCLOPramide (REGLAN) injection 10 mg (10 mg Intravenous Given 10/16/18 1325)  pantoprazole (PROTONIX) injection 40 mg (40 mg Intravenous Given 10/16/18 1325)  diphenhydrAMINE (BENADRYL) injection 12.5 mg (12.5 mg Intravenous Given 10/16/18 1325)  sodium chloride 0.9 % bolus 1,000 mL (1,000 mLs Intravenous New Bag/Given 10/16/18 1547)  morphine 4 MG/ML injection 4 mg (4 mg Intravenous Given 10/16/18 1710)  promethazine (PHENERGAN) injection 12.5 mg (12.5 mg Intravenous Given 10/16/18 1710)     Initial Impression / Assessment and Plan / ED Course  I have reviewed the triage vital signs and the nursing notes.  Pertinent labs & imaging results that were available during my care of the patient were reviewed by me and considered in my medical decision making (see chart for details).  Clinical Course as of Oct 16 1802  Sat Oct 16, 2018  1421 Glucose(!): 281 [CG]  1421 Anion gap: 13 [CG]  1428 Reevaluated patient.  She is found asleep.  Easily arousable.  Heart rate in the 120s.  States she vomited one time since initial evaluation.  No emesis bag nearby, states she threw it out in the trash.  Continues to report pain in her chest and abdomen.  Will give another IV fluid bolus, reassess.   [CG]  1429 Sinus tachycardia ST elev, probable normal early repol pattern Borderline prolonged QT interval 492 No significant change since 10/11/2018 Confirmed by Geoffery LyonseLo, Douglas ([id number]) on 10/16/2018 1:39:12 PM  EKG 12-Lead [CG]  1557 RN notified me patient is complaining of nausea vomiting and pain still however RN and myself have not seen emesis or emesis bag in the room.  Patient reevaluated again and she is found asleep, easily arousable.   [CG]  1639 Troponin I  (High Sensitivity): <2 [CG]  1648 Secretary called me into room. Apparently teaching service residents walking by room saw patient on floor.  Walked into room and patient is laying on the floor on her back with eyes closed. Sternal rub and chest wall skin pinch and she immediately opened her eyes.  No tremors, shaking. No tongue biting or incontinence.  No post-ictal confusion.  She stood up from ground and sat on bed with minimal assistance. Patient states "I think I had a seizure"   [CG]  1748 Reevaluated patient again.  She is found asleep, drooling.  Easily arousable.  Still states she feels poorly but actually states she feels a little bit better.  Explained to her that her labs are normal and that I have checked in on her in the last 45 minutes and she has been asleep all 3 times which just she feels slightly better.  She agrees with this.  Recommended discharge with Phenergan oral and rectally and she is comfortable with the plan.   [CG]    Clinical Course User Index [CG] Liberty HandyGibbons, Einar Nolasco J, PA-C   EMR reviewed.  Recent hospitalization with discharge date yesterday reviewed.  She had EGD done that showed esophagitis and candidiasis.  During hospitalization she reported chest pain and EKG showed some acute ST elevation so cardiology did LHC showing pain coronaries questionable distal LAD pruning which could be from dissection.  Her troponins were undetectable however and cardiology thought this is  likely a normal variant.  ER work-up today reviewed by me is vastly reassuring.  Hyperglycemia without DKA or HHS.  Labs otherwise normal.  Sinus tachycardia on EKG with heart rate in the 110s to the 120s, when compared to previous ER visits she is usually tachycardic.  She has generalized abdominal tenderness but no peritonitis, surgical abdomen or focal tenderness to the RUQ or RLQ.  She has no urinary symptoms.  No diarrhea.  Given recent extensive work-up at last admission, reassuring labs today I  do not think further emergent lab work, imaging or admission is indicated today.  Patient has been reevaluated several times, found asleep in no distress but when she wakes up she again reports abdominal pain and nausea and vomiting however RN and myself have not seen emesis or emesis bag in the room.  Will re-dose antiemetics, finish IVF, reasess.    1755: Pt has been reevaluated several times by RN and myself.  Only saw minimal amount of emesis non bloody without coffee ground in bag once during almost 6 hr ER stay.  Repeat abd exam unchanged.  Sinus tachycardia significantly improved now 108.  She had questionable fall to ground unwitnessed 15 minutes after I had discussed plan to discharge plan.  No convulsions, tongue biting, incontinence, post ictal state. Sternal rub/nipple pinch and she immediately woke up and transferred to bed on her own. When asked what happened she said she "had a seizure".  Seen by neurology recently and had normal EEG with physical tremors but nothing captured on EEG.    Discussed with patient at length that she has been asleep throughout several re-evaluation in ER with minimal emesis, improving VS, extensive recent work up in hospital. I do not think she needs readmission today.  She was actually agreeable and stated she felt a little better. Will dc with phenergan PR and PO.  Shared with EDP.    Final Clinical Impressions(s) / ED Diagnoses   Final diagnoses:  Nausea and vomiting in adult  Sinus tachycardia  Hyperglycemia    ED Discharge Orders         Ordered    promethazine (PHENERGAN) 25 MG suppository  Every 6 hours PRN     10/16/18 1802           Liberty Handy, PA-C 10/16/18 1805    Geoffery Lyons, MD 10/17/18 1103

## 2018-10-16 NOTE — Discharge Instructions (Addendum)
You are seen in the ER for nausea vomiting abdominal pain, chest pain  Glucose levels were slightly elevated but everything else was normal  You were given several rounds of nausea medicine and pain medicines and on reevaluation to work asleep and appeared more comfortable  Stay hydrated as best he can, use promethazine/Phenergan suppository every 6 hours  Call your doctor to make an appointment for reevaluation on Monday.

## 2018-10-16 NOTE — ED Triage Notes (Signed)
Pt returning to ER for evaluation of persistent nausea and vomiting. Pt actively vomiting in triage. Arrives by EMS, 20g in [place], received 4 mg zofran in route with EMS. Was just discharged from the hospital yesterday.

## 2018-10-18 ENCOUNTER — Telehealth: Payer: Self-pay

## 2018-10-18 MED FILL — PROMETHAZINE HCL 25 MG SUPP: 25 | 3 days supply | Qty: 12 | Fill #0

## 2018-10-18 NOTE — Telephone Encounter (Signed)
Transition Care Management Follow-up Telephone Call Date of discharge and from where: 10/15/2018, [facility] 10/16/2018 - returned to ED.  Attempted to contact patient # [phone], message left with call back requested to this CM # [phone]

## 2018-10-19 ENCOUNTER — Telehealth: Payer: Self-pay

## 2018-10-19 ENCOUNTER — Emergency Department (HOSPITAL_COMMUNITY): Payer: Medicaid Other

## 2018-10-19 ENCOUNTER — Other Ambulatory Visit: Payer: Self-pay

## 2018-10-19 ENCOUNTER — Emergency Department (HOSPITAL_COMMUNITY)
Admission: EM | Admit: 2018-10-19 | Discharge: 2018-10-19 | Disposition: A | Discharge status: Home / Self Care | Payer: Medicaid Other | Attending: Emergency Medicine | Admitting: Emergency Medicine

## 2018-10-19 DIAGNOSIS — R0789 Other chest pain: Secondary | ICD-10-CM | POA: Insufficient documentation

## 2018-10-19 DIAGNOSIS — Z79899 Other long term (current) drug therapy: Secondary | ICD-10-CM | POA: Diagnosis not present

## 2018-10-19 DIAGNOSIS — Z794 Long term (current) use of insulin: Secondary | ICD-10-CM | POA: Insufficient documentation

## 2018-10-19 DIAGNOSIS — N189 Chronic kidney disease, unspecified: Secondary | ICD-10-CM | POA: Diagnosis not present

## 2018-10-19 DIAGNOSIS — E1022 Type 1 diabetes mellitus with diabetic chronic kidney disease: Secondary | ICD-10-CM | POA: Diagnosis not present

## 2018-10-19 DIAGNOSIS — G8929 Other chronic pain: Secondary | ICD-10-CM | POA: Diagnosis not present

## 2018-10-19 DIAGNOSIS — R112 Nausea with vomiting, unspecified: Secondary | ICD-10-CM | POA: Insufficient documentation

## 2018-10-19 DIAGNOSIS — I509 Heart failure, unspecified: Secondary | ICD-10-CM | POA: Insufficient documentation

## 2018-10-19 DIAGNOSIS — I110 Hypertensive heart disease with heart failure: Secondary | ICD-10-CM | POA: Diagnosis not present

## 2018-10-19 LAB — COMPREHENSIVE METABOLIC PANEL
ALT: 43 U/L (ref 0–44)
AST: 30 U/L (ref 15–41)
Albumin: 3.6 g/dL (ref 3.5–5.0)
Alkaline Phosphatase: 69 U/L (ref 38–126)
Anion gap: 9 (ref 5–15)
BUN: 5 mg/dL — ABNORMAL LOW (ref 6–20)
CO2: 26 mmol/L (ref 22–32)
Calcium: 8.8 mg/dL — ABNORMAL LOW (ref 8.9–10.3)
Chloride: 100 mmol/L (ref 98–111)
Creatinine, Ser: 0.58 mg/dL (ref 0.44–1.00)
GFR calc Af Amer: >60 mL/min (ref >60)
GFR calc non Af Amer: >60 mL/min (ref >60)
Glucose, Bld: 298 mg/dL — ABNORMAL HIGH (ref 70–99)
Potassium: 4.1 mmol/L (ref 3.5–5.1)
Sodium: 135 mmol/L (ref 135–145)
Total Bilirubin: 0.3 mg/dL (ref 0.3–1.2)
Total Protein: 6.3 g/dL — ABNORMAL LOW (ref 6.5–8.1)

## 2018-10-19 LAB — CBC
HCT: 37.2 % (ref 36.0–46.0)
Hemoglobin: 11.8 g/dL — ABNORMAL LOW (ref 12.0–15.0)
MCH: 28 pg (ref 26.0–34.0)
MCHC: 31.7 g/dL (ref 30.0–36.0)
MCV: 88.2 fL (ref 80.0–100.0)
Platelets: 373 10*3/uL (ref 150–400)
RBC: 4.22 MIL/uL (ref 3.87–5.11)
RDW: 13.2 % (ref 11.5–15.5)
WBC: 8.6 10*3/uL (ref 4.0–10.5)
nRBC: 0 % (ref 0.0–0.2)

## 2018-10-19 LAB — I-STAT BETA HCG BLOOD, ED (MC, WL, AP ONLY): I-stat hCG, quantitative: <5 m[IU]/mL (ref <5)

## 2018-10-19 LAB — LIPASE, BLOOD: Lipase: 31 U/L (ref 11–51)

## 2018-10-19 LAB — TROPONIN I (HIGH SENSITIVITY): Troponin I (High Sensitivity): <2 ng/L (ref <18)

## 2018-10-19 MED ORDER — ALUM & MAG HYDROXIDE-SIMETH 200-200-20 MG/5ML PO SUSP
15.0000 mL | Freq: Once | ORAL | Status: AC
  Administered 2018-10-19: 15 mL via ORAL
  Filled 2018-10-19: qty 30

## 2018-10-19 MED ORDER — FAMOTIDINE 20 MG PO TABS
20.0000 mg | ORAL_TABLET | Freq: Once | ORAL | Status: AC
  Administered 2018-10-19: 20 mg via ORAL
  Filled 2018-10-19: qty 1

## 2018-10-19 MED ORDER — METOCLOPRAMIDE HCL 5 MG/ML IJ SOLN
10.0000 mg | Freq: Once | INTRAMUSCULAR | Status: DC
  Filled 2018-10-19: qty 2

## 2018-10-19 MED ORDER — SODIUM CHLORIDE 0.9 % IV BOLUS
1000.0000 mL | Freq: Once | INTRAVENOUS | Status: AC
  Administered 2018-10-19: 1000 mL via INTRAVENOUS

## 2018-10-19 MED ORDER — DIPHENHYDRAMINE HCL 50 MG/ML IJ SOLN
25.0000 mg | Freq: Once | INTRAMUSCULAR | Status: DC
  Filled 2018-10-19: qty 1

## 2018-10-19 MED ORDER — ACETAMINOPHEN 325 MG PO TABS
650.0000 mg | ORAL_TABLET | Freq: Once | ORAL | Status: AC
  Administered 2018-10-19: 650 mg via ORAL
  Filled 2018-10-19: qty 2

## 2018-10-19 NOTE — ED Notes (Signed)
MD still would like to Dc, pt agrees.

## 2018-10-19 NOTE — ED Triage Notes (Signed)
3 day hx of N/V and emesis of bright red blood also witnessed by EMS.  Reported ly recent admission for CHF and cardiac cath was done with narrowing noted but no blockage.  Pt states her chest pain has been constant since discharge with no relief at a 10.   Does c/o minimal dyspnea.  Pt calm and quiet, no outward distress or pain responses.  Denies any diarrhea.

## 2018-10-19 NOTE — Telephone Encounter (Signed)
Transition Care Management Follow-up Telephone Call  Attempt # 2  Date of discharge and from where: 10/15/2018, [facility] - returned to ED 10/16/2018.  Attempted to contact patient # [id number], message left with call back requested to this CM # [phone].

## 2018-10-19 NOTE — ED Notes (Signed)
States her pain is worse, intermittently like electricity is hitting her.  Pt aware she is up for DC, PA notified at this time.

## 2018-10-19 NOTE — ED Provider Notes (Signed)
[facility]   CSN: [id number] Arrival date & time: 10/19/18  1300     History   Chief Complaint No chief complaint on file.   HPI Nancy ParadiseRashonda [name] Singley is a 18 y.o. female past medical history of anxiety, CHF, depression, type 1 diabetes, obesity, seizures who presents for evaluation of chest pain, shortness of breath, vomiting blood, abdominal pain.  She reports that this has been constant since she was discharged on the hospital on 10/15/2018.  She reports the symptoms are never gone away.  She was seen here in the ED on 10/16/2018 for evaluation of symptoms.  At that time, her work-up was unremarkable and she was discharged home.  States that "everybody is focused on other things and I keep having all the symptoms."  Patient states she also feels like she has been having seizures.  She states that she "just knows that she is having them."  She states she is vomiting bright red blood.  She has not had any blood in stools and denies any diarrhea.  She reports a constant, sharp chest pain.  She states that her abdominal pain has been constant but will sometimes get better.  She has not had any urinary complaints, fevers, chills.  She reports she took her at home medications with no improvement in symptoms.     The history is provided by the patient.    Past Medical History:  Diagnosis Date  . Acanthosis nigricans   . Anxiety   . CHF (congestive heart failure) (HCC)   . Chronic lower back pain   . Depression   . DKA, type 1 (HCC) 09/13/2018  . Dyspepsia   . Obesity   . Ovarian cyst    pt is not aware of this hx (11/24/2017)  . Pre-diabetes   . 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 Premature baby   . Seizures (HCC)   . Type II diabetes mellitus (HCC)    insulin dependant    Patient Active Problem List   Diagnosis Date Noted  . Nonspecific abnormal electrocardiogram (ECG) (EKG)   . Chest pain of uncertain etiology   . DKA, type 1 (HCC)  10/08/2018  . Hypertensive urgency 10/08/2018  . Conversion disorder with attacks or seizures, acute episode, with psychological stressor 09/16/2018  . Suicidal ideations 09/13/2018  . MDD (major depressive disorder), recurrent severe, without psychosis (HCC)   . Syncope 01/30/2018  . Orthostatic hypotension 01/24/2018  . Chronic abdominal pain 12/24/2017  . Chest pain 12/19/2017  . Nausea and vomiting 08/21/2017  . Generalized abdominal pain 08/21/2017  . Non compliance with medical treatment 01/27/2012  . Adjustment disorder 09/16/2011  . DM (diabetes mellitus), type 1, uncontrolled (HCC) 09/15/2011  . Acanthosis nigricans   . Goiter   . Obesity 06/14/2010  . Hypertension 06/14/2010    Past Surgical History:  Procedure Laterality Date  . ABDOMINAL HERNIA REPAIR     "I was a baby"  . BIOPSY  10/12/2018   Procedure: BIOPSY;  Surgeon: Lynann BolognaGupta, Rajesh, MD;  [facility];  Service: Endoscopy;;  . ESOPHAGOGASTRODUODENOSCOPY (EGD) WITH PROPOFOL [name]/A 10/12/2018   Procedure: ESOPHAGOGASTRODUODENOSCOPY (EGD) WITH PROPOFOL;  Surgeon: Lynann BolognaGupta, Rajesh, MD;  [facility];  Service: Endoscopy;  Laterality: [name]/A;  . HERNIA REPAIR    . LEFT HEART CATH AND CORONARY ANGIOGRAPHY [name]/A 10/13/2018   Procedure: LEFT HEART CATH AND CORONARY ANGIOGRAPHY;  Surgeon: Kathleene HazelMcAlhany, Christopher D, MD;  Location: [facility] INVASIVE CV LAB;  Service: Cardiovascular;  Laterality: [name]/A;  . TONSILLECTOMY AND ADENOIDECTOMY    . WISDOM TOOTH EXTRACTION  2017     OB History   No obstetric history on file.      Home Medications    Prior to Admission medications   Medication Sig Start Date End Date Taking? Authorizing Provider  amLODipine (NORVASC) 5 MG tablet Take 1 tablet (5 mg total) by mouth daily. 10/16/18 11/15/18  Jerald Kief, MD  ARIPiprazole (ABILIFY) 20 MG tablet Take 20 mg by mouth daily.    [provider]  atorvastatin (LIPITOR) 20 MG tablet Take 1 tablet (20 mg total) by mouth daily  at 6 PM. 10/05/18   Storm Frisk, MD  fluconazole (DIFLUCAN) 100 MG tablet Take 1 tablet (100 mg total) by mouth daily for 12 days. 10/16/18 10/28/18  Jerald Kief, MD  FLUoxetine (PROZAC) 40 MG capsule Take 1 capsule (40 mg total) by mouth at bedtime. 10/05/18   Storm Frisk, MD  glucagon 1 MG injection Use for Severe Hypoglycemia . Inject 1 mg intramuscularly if unresponsive, unable to swallow, unconscious and/or has seizure Patient taking differently: Inject 1 mg into the muscle once as needed (for severe hypoglycemia- if unresponsive, unable to swallow, unconscious, and/or has had seizure(s)).  07/20/13 12/19/25  Romero Belling, MD  hydrOXYzine (ATARAX/VISTARIL) 25 MG tablet Take 1 tablet (25 mg total) by mouth 3 (three) times daily as needed for anxiety. 10/05/18   Storm Frisk, MD  Insulin Degludec (TRESIBA) 100 UNIT/ML SOLN Inject 52 Units into the skin daily. 10/05/18   Storm Frisk, MD  insulin lispro (HUMALOG) 100 UNIT/ML injection Inject 0.15 mLs (15 Units total) into the skin 2 (two) times daily with a meal. 10/05/18   Storm Frisk, MD  lamoTRIgine (LAMICTAL) 25 MG tablet Take 1 tablet (25 mg total) by mouth 2 (two) times daily. 10/05/18   Storm Frisk, MD  metoCLOPramide (REGLAN) 10 MG tablet Take 1 tablet (10 mg total) by mouth 4 (four) times daily -  before meals and at bedtime. 10/15/18 11/14/18  Jerald Kief, MD  Multiple Vitamin (MULTIVITAMIN WITH MINERALS) TABS tablet Take 1 tablet by mouth daily. 09/18/18   Clapacs, Jackquline Denmark, MD  pantoprazole (PROTONIX) 40 MG tablet Take 1 tablet (40 mg total) by mouth daily. 10/16/18 11/15/18  Jerald Kief, MD  promethazine (PHENERGAN) 25 MG suppository Place 1 suppository (25 mg total) rectally every 6 (six) hours as needed for nausea or vomiting. 10/16/18   Liberty Handy, PA-C  propranolol (INDERAL) 10 MG tablet Take 1 tablet (10 mg total) by mouth 3 (three) times daily. 10/15/18 11/14/18  Jerald Kief, MD   topiramate (TOPAMAX) 25 MG tablet Take 1 tablet (25 mg total) by mouth 2 (two) times daily. 10/05/18   Storm Frisk, MD  traZODone (DESYREL) 100 MG tablet Take 3 tablets (300 mg total) by mouth at bedtime. 10/05/18   Hoy Register, MD    Family History Family History  Problem Relation Age of Onset  . Diabetes Mother   . Hypertension Mother   . Obesity Mother   . Asthma Mother   . Allergic rhinitis Mother   . Eczema Mother   . Diabetes Father   . Hypertension Father   . Obesity Father   . Hyperlipidemia Father   . Hypertension Paternal Aunt   . Hypertension Maternal Grandfather   . Colon cancer Maternal Grandfather   . Diabetes Paternal Grandmother   . Obesity Paternal Grandmother   .  Diabetes Paternal Grandfather   . Obesity Paternal Grandfather   . Angioedema Neg Hx   . Immunodeficiency Neg Hx   . Urticaria Neg Hx   . Stomach cancer Neg Hx   . Esophageal cancer Neg Hx     Social History Social History   Tobacco Use  . Smoking status: Never Smoker  . Smokeless tobacco: Never Used  Substance Use Topics  . Alcohol use: No    Alcohol/week: 0.0 standard drinks  . Drug use: No     Allergies   Ibuprofen   Review of Systems Review of Systems  Constitutional: Negative for fever.  Respiratory: Positive for shortness of breath. Negative for cough.   Cardiovascular: Positive for chest pain.  Gastrointestinal: Positive for abdominal pain, nausea and vomiting. Negative for blood in stool.  Genitourinary: Negative for dysuria and hematuria.  Neurological: Negative for headaches.  All other systems reviewed and are negative.    Physical Exam Updated Vital Signs BP 127/79 (BP Location: Right Arm)   Pulse (!) 102   Temp 98.4 F (36.9 C) (Oral)   Resp (!) 28   Ht 5\' 3"  (1.6 m)   Wt 89.8 kg   LMP 10/19/2018   SpO2 100%   BMI 35.07 kg/m   Physical Exam Vitals signs and nursing note reviewed.  Constitutional:      Appearance: Normal appearance. She is  well-developed.  HENT:     Head: Normocephalic and atraumatic.  Eyes:     General: Lids are normal.     Conjunctiva/sclera: Conjunctivae normal.     Pupils: Pupils are equal, round, and reactive to light.  Neck:     Musculoskeletal: Full passive range of motion without pain.  Cardiovascular:     Rate and Rhythm: Normal rate and regular rhythm.     Pulses: Normal pulses.          Radial pulses are 2+ on the right side and 2+ on the left side.       Dorsalis pedis pulses are 2+ on the right side and 2+ on the left side.     Heart sounds: Normal heart sounds. No murmur. No friction rub. No gallop.   Pulmonary:     Effort: Pulmonary effort is normal.     Breath sounds: Normal breath sounds.     Comments: Lungs clear to auscultation bilaterally.  Symmetric chest rise.  No wheezing, rales, rhonchi. Abdominal:     Palpations: Abdomen is soft. Abdomen is not rigid.     Tenderness: There is no abdominal tenderness. There is no guarding.     Comments: Abdomen is soft, non-distended, non-tender. No rigidity, No guarding. No peritoneal signs.  As I am palpating her abdomen, she shows no signs of distress.  No active vomiting.  Musculoskeletal: Normal range of motion.  Skin:    General: Skin is warm and dry.     Capillary Refill: Capillary refill takes less than 2 seconds.  Neurological:     Mental Status: She is alert and oriented to person, place, and time.     Comments: MAE Folows commands  Normal strength Normal coordination  Psychiatric:        Speech: Speech normal.      ED Treatments / Results  Labs (all labs ordered are listed, but only abnormal results are displayed) Labs Reviewed  CBC - Abnormal; Notable for the following components:      Result Value   Hemoglobin 11.8 (*)    All other components within  normal limits  COMPREHENSIVE METABOLIC PANEL - Abnormal; Notable for the following components:   Glucose, Bld 298 (*)    BUN 5 (*)    Calcium 8.8 (*)    Total Protein  6.3 (*)    All other components within normal limits  LIPASE, BLOOD  I-STAT BETA HCG BLOOD, ED ([facility], [facility], [facility] ONLY)  TROPONIN I (HIGH SENSITIVITY)    EKG EKG Interpretation  Date/Time:  Tuesday October 19 2018 13:09:03 EDT Ventricular Rate:  103 PR Interval:    QRS Duration: 98 QT Interval:  360 QTC Calculation: 472 R Axis:   43 Text Interpretation:  Sinus tachycardia Baseline wander No significant change since last tracing Confirmed by Lajean Saver [phone]) on 10/19/2018 1:14:14 PM   Radiology Dg Chest Port 1 View  Result Date: 10/19/2018 CLINICAL DATA:  Chest pain. EXAM: PORTABLE CHEST 1 VIEW COMPARISON:  09/13/2018. FINDINGS: Mediastinum and hilar structures normal. Lungs are clear. No pleural effusion or pneumothorax. Heart size normal. No acute bony abnormality. IMPRESSION: No acute cardiopulmonary disease. Electronically Signed   By: Marcello Moores  Register   On: 10/19/2018 14:27    Procedures Procedures (including critical care time)  Medications Ordered in ED Medications  famotidine (PEPCID) tablet 20 mg (20 mg Oral Given 10/19/18 1513)  alum & mag hydroxide-simeth (MAALOX/MYLANTA) 200-200-20 MG/5ML suspension 15 mL (15 mLs Oral Given 10/19/18 1513)  acetaminophen (TYLENOL) tablet 650 mg (650 mg Oral Given 10/19/18 1513)  sodium chloride 0.9 % bolus 1,000 mL (1,000 mLs Intravenous New Bag/Given 10/19/18 1512)     Initial Impression / Assessment and Plan / ED Course  I have reviewed the triage vital signs and the nursing notes.  Pertinent labs & imaging results that were available during my care of the patient were reviewed by me and considered in my medical decision making (see chart for details).  Clinical Course as of Oct 18 1529  Tue Oct 19, 2018  1530 BUN(!): 5 [LL]    Clinical Course User Index [LL] Volanda Napoleon, PA-C       18 year old female who presents for evaluation of continued and persistent chest pain, abdominal pain, bloody emesis.  She was  recently in the hospital and discharged on 10/15/2018.  She reports that symptoms have resolved and they have been continuous since then.  Seen here in the ED on 10/16/2018 with unremarkable work-up but states she continues have symptoms.  Also states that she feels like she has been having seizures.  When I asked her how she knows this, she states "I just know that I am having them."  Reports bright red blood in emesis.  No fevers, diarrhea. Patient is afebrile, non-toxic appearing, sitting comfortably on examination table. Vital signs reviewed and stable.  On my evaluation, she is sitting comfortably in bed with no signs of distress.  No active vomiting.  Vitals are stable.  Plan to check basic labs, chest x-ray, EKG.  Review of her previous visit on 10/16/2018.  She did extensive work-up was unremarkable.  She continued to tell ED provider that she had been vomiting there is no signs of vomiting and she was hemodynamically stable.  Will get chest x-ray but hold on any any imaging of her abdomen right now.  I-STAT beta negative.  CBC shows no leukocytosis.  Hemoglobin stable at 11.8.  This is down slightly from 12.93 days ago.  CMP shows glucose of 298.  Normal bicarb.  Normal anion gap.  Lipase is normal.  Troponin is negative.  Her symptoms have been constant for the last several days.  She states she is not pain-free since 10/15/2018.  At this time, given her constant chest pain and atypical presentation, do not feel this is representative ACS etiology.  1 troponin is sufficient rule out for ACS etiology.  Discussed results with patient.  Vitals are stable.  Patient is resting comfortably in bed.  No signs of acute distress.  Repeat abdominal exam shows diffuse tenderness.  She has no rigidity, guarding.  No location that she would need CT abdomen does not suspect surgical abdomen.  Additionally, she has not had any vomiting here in the ED.  She has been sitting and resting comfortably and is asking for  something to eat. At this time, patient exhibits no emergent life-threatening condition that require further evaluation in ED or admission. Discussed patient with Dr. Denton Lank who is agreeable.  Patient has not followed up with her outpatient GI.  Instructed her to take her Protonix and Pepcid at home as directed and follow-up with GI as directed. Patient had ample opportunity for questions and discussion. All patient's questions were answered with full understanding. Strict return precautions discussed. Patient expresses understanding and agreement to plan.   Portions of this note were generated with Scientist, clinical (histocompatibility and immunogenetics). Dictation errors may occur despite best attempts at proofreading.   Final Clinical Impressions(s) / ED Diagnoses   Final diagnoses:  Atypical chest pain  Non-intractable vomiting with nausea, unspecified vomiting type    ED Discharge Orders    None       Maxwell Caul, PA-C 10/20/18 [id number]    Cathren Laine, MD 10/20/18 1023

## 2018-10-19 NOTE — Telephone Encounter (Signed)
Transition Care Management Follow-up Telephone Call  Date of discharge and from where: 10/15/2018, [facility], then returned to ED on 10/16/2018   How have you been since you were released from the hospital? She said than she is feeling worse than when she was hospitalized.  She explained that she has " a lot " of nausea and vomiting. She said her stomach " hurts really bad" and she is not constipated. She has filled 3-4 emesis bags today. No blood reported with emesis.  She stated that she has not been able to tolerate any food/drink. Blood sugar is 154.  She said she went back to the ED on 10/16/2018  With the same complaints but she does not feel any better. She said that she has been taking the reglan, promethazine and pantoprazole  as ordered with no relief. At this point, she said that her mother will be taking her back to the ED.   Any questions or concerns? medical concerns noted above  She also stated that she only has family planning medicaid and is worried about her medical bills. She said that she spoke to a case worker at [facility] yesterday and was told that she only has family planning medicaid. She currently has no income.   Items Reviewed:  Did the pt receive and understand the discharge instructions provided? She has the instructions and has no questions.   Medications obtained and verified? She said that she has all medications. No questions at this time   Any new allergies since your discharge?none reported   Do you have support at home? Lives with her mother and her mother's girlfriend.   Other (ie: DME, Home Health, etc) no home health ordered.   Has glucometer.   Functional Questionnaire: (I = Independent and D = Dependent) ADL's: independent   Follow up appointments reviewed:    PCP Hospital f/u appt confirmed? 11/08/2018 @ 1330 with Ms Raul Del.    [facility] f/u appt confirmed? She said that she has an appointment with Dr Havery Moros in 2 weeks but  the schedule is not in Epic.   Are transportation arrangements needed? No   If their condition worsens, is the pt aware to call  their PCP or go to the ED? Yes - going to ED now.   Was the patient provided with contact information for the PCP's office or ED? Yes, she has the clinic phone number  Was the pt encouraged to call back with questions or concerns? Yes

## 2018-10-19 NOTE — Discharge Instructions (Signed)
Take your medications at home as directed.  Call your GI doctor and arrange for an appointment.  Return the emergency department for any worsening chest pain, difficulty breathing, abdominal pain, fevers or any other worsening or concerning symptoms.

## 2018-10-20 ENCOUNTER — Telehealth: Payer: Self-pay

## 2018-10-20 NOTE — Telephone Encounter (Signed)
Left message for patient to please call back. (trying to schedule a follow-up office visit with an app. from her ED visit on 10/19/18)

## 2018-10-24 ENCOUNTER — Other Ambulatory Visit: Payer: Self-pay

## 2018-10-24 ENCOUNTER — Emergency Department (HOSPITAL_COMMUNITY)
Admission: EM | Admit: 2018-10-24 | Discharge: 2018-10-25 | Disposition: A | Discharge status: Home / Self Care | Payer: Medicaid Other | Attending: Emergency Medicine | Admitting: Emergency Medicine

## 2018-10-24 ENCOUNTER — Encounter (HOSPITAL_COMMUNITY): Payer: Self-pay | Admitting: Emergency Medicine

## 2018-10-24 DIAGNOSIS — Z79899 Other long term (current) drug therapy: Secondary | ICD-10-CM | POA: Diagnosis not present

## 2018-10-24 DIAGNOSIS — I509 Heart failure, unspecified: Secondary | ICD-10-CM | POA: Diagnosis not present

## 2018-10-24 DIAGNOSIS — R1084 Generalized abdominal pain: Secondary | ICD-10-CM | POA: Diagnosis present

## 2018-10-24 DIAGNOSIS — R112 Nausea with vomiting, unspecified: Secondary | ICD-10-CM | POA: Diagnosis not present

## 2018-10-24 DIAGNOSIS — E109 Type 1 diabetes mellitus without complications: Secondary | ICD-10-CM | POA: Diagnosis not present

## 2018-10-24 DIAGNOSIS — G8929 Other chronic pain: Secondary | ICD-10-CM | POA: Diagnosis not present

## 2018-10-24 DIAGNOSIS — R109 Unspecified abdominal pain: Secondary | ICD-10-CM

## 2018-10-24 DIAGNOSIS — R110 Nausea: Secondary | ICD-10-CM

## 2018-10-24 LAB — CBC
HCT: 39.6 % (ref 36.0–46.0)
Hemoglobin: 12.7 g/dL (ref 12.0–15.0)
MCH: 27.9 pg (ref 26.0–34.0)
MCHC: 32.1 g/dL (ref 30.0–36.0)
MCV: 86.8 fL (ref 80.0–100.0)
Platelets: 374 10*3/uL (ref 150–400)
RBC: 4.56 MIL/uL (ref 3.87–5.11)
RDW: 13.2 % (ref 11.5–15.5)
WBC: 8.8 10*3/uL (ref 4.0–10.5)
nRBC: 0 % (ref 0.0–0.2)

## 2018-10-24 LAB — COMPREHENSIVE METABOLIC PANEL
ALT: 33 U/L (ref 0–44)
AST: 16 U/L (ref 15–41)
Albumin: 4.2 g/dL (ref 3.5–5.0)
Alkaline Phosphatase: 86 U/L (ref 38–126)
Anion gap: 13 (ref 5–15)
BUN: 12 mg/dL (ref 6–20)
CO2: 22 mmol/L (ref 22–32)
Calcium: 9.2 mg/dL (ref 8.9–10.3)
Chloride: 98 mmol/L (ref 98–111)
Creatinine, Ser: 0.73 mg/dL (ref 0.44–1.00)
GFR calc Af Amer: >60 mL/min (ref >60)
GFR calc non Af Amer: >60 mL/min (ref >60)
Glucose, Bld: 305 mg/dL — ABNORMAL HIGH (ref 70–99)
Potassium: 3.8 mmol/L (ref 3.5–5.1)
Sodium: 133 mmol/L — ABNORMAL LOW (ref 135–145)
Total Bilirubin: 0.5 mg/dL (ref 0.3–1.2)
Total Protein: 7.1 g/dL (ref 6.5–8.1)

## 2018-10-24 LAB — LIPASE, BLOOD: Lipase: 21 U/L (ref 11–51)

## 2018-10-24 LAB — I-STAT BETA HCG BLOOD, ED (MC, WL, AP ONLY): I-stat hCG, quantitative: <5 m[IU]/mL (ref <5)

## 2018-10-24 MED ORDER — SODIUM CHLORIDE 0.9% FLUSH: 3.0000 mL | Freq: Once | INTRAVENOUS | Status: DC

## 2018-10-24 MED ORDER — PROMETHAZINE HCL 25 MG/ML IJ SOLN
12.5000 mg | Freq: Once | INTRAMUSCULAR | Status: AC
  Administered 2018-10-24: 23:00:00 12.5 mg via INTRAVENOUS
  Filled 2018-10-24: qty 1

## 2018-10-24 MED ORDER — SODIUM CHLORIDE 0.9 % IV BOLUS
1000.0000 mL | Freq: Once | INTRAVENOUS | Status: AC
  Administered 2018-10-24: 23:00:00 1000 mL via INTRAVENOUS

## 2018-10-24 NOTE — ED Provider Notes (Signed)
[facility] EMERGENCY DEPARTMENT Provider Note   CSN: [id number] Arrival date & time: 10/24/18  1901     History   Chief Complaint Chief Complaint  Patient presents with  . Seizures  . Emesis    HPI Nancy [name] is a 18 y.o. female.     Patient with history of T1DM, medical noncompliance, CHF, psychogenic seizures, chronic abdominal pain presents for the 3rd time since discharge on 10/15/18 for similar complaints of nausea, vomiting, abdominal and chest pain with report of seizures and hematemesis. No fever. She reports constipation at home with small bowel movement today after using Miralax. She has PR and PO phenergan at home that is not providing relief. No syncope but she reports she thinks she had a seizure today lasting 10 minutes. This event was unwitnessed.   The history is provided by the patient and the EMS personnel. No language interpreter was used.  Seizures Emesis Associated symptoms: abdominal pain   Associated symptoms: no chills and no fever     Past Medical History:  Diagnosis Date  . Acanthosis nigricans   . Anxiety   . CHF (congestive heart failure) (HCC)   . Chronic lower back pain   . Depression   . DKA, type 1 (HCC) 09/13/2018  . Dyspepsia   . Obesity   . Ovarian cyst    pt is not aware of this hx (11/24/2017)  . Pre-diabetes   . 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 Premature baby   . Seizures (HCC)   . Type II diabetes mellitus (HCC)    insulin dependant    Patient Active Problem List   Diagnosis Date Noted  . Nonspecific abnormal electrocardiogram (ECG) (EKG)   . Chest pain of uncertain etiology   . DKA, type 1 (HCC) 10/08/2018  . Hypertensive urgency 10/08/2018  . Conversion disorder with attacks or seizures, acute episode, with psychological stressor 09/16/2018  . Suicidal ideations 09/13/2018  . MDD (major depressive disorder), recurrent severe, without psychosis (HCC)   . Syncope 01/30/2018  . Orthostatic  hypotension 01/24/2018  . Chronic abdominal pain 12/24/2017  . Chest pain 12/19/2017  . Nausea and vomiting 08/21/2017  . Generalized abdominal pain 08/21/2017  . Non compliance with medical treatment 01/27/2012  . Adjustment disorder 09/16/2011  . DM (diabetes mellitus), type 1, uncontrolled (HCC) 09/15/2011  . Acanthosis nigricans   . Goiter   . Obesity 06/14/2010  . Hypertension 06/14/2010    Past Surgical History:  Procedure Laterality Date  . ABDOMINAL HERNIA REPAIR     "I was a baby"  . BIOPSY  10/12/2018   Procedure: BIOPSY;  Surgeon: Lynann Bologna, MD;  Location: [facility] ENDOSCOPY;  Service: Endoscopy;;  . ESOPHAGOGASTRODUODENOSCOPY (EGD) WITH PROPOFOL N/A 10/12/2018   Procedure: ESOPHAGOGASTRODUODENOSCOPY (EGD) WITH PROPOFOL;  Surgeon: Lynann Bologna, MD;  Location: [facility] ENDOSCOPY;  Service: Endoscopy;  Laterality: N/A;  . HERNIA REPAIR    . LEFT HEART CATH AND CORONARY ANGIOGRAPHY N/A 10/13/2018   Procedure: LEFT HEART CATH AND CORONARY ANGIOGRAPHY;  Surgeon: Kathleene Hazel, MD;  Location: [facility] INVASIVE CV LAB;  Service: Cardiovascular;  Laterality: N/A;  . TONSILLECTOMY AND ADENOIDECTOMY    . WISDOM TOOTH EXTRACTION  2017     OB History   No obstetric history on file.      Home Medications    Prior to Admission medications   Medication Sig Start Date End Date Taking? Authorizing Provider  amLODipine (NORVASC) 5 MG tablet Take 1 tablet (5 mg total)  by mouth daily. 10/16/18 11/15/18  Jerald Kief, MD  ARIPiprazole (ABILIFY) 20 MG tablet Take 20 mg by mouth daily.    [provider]  atorvastatin (LIPITOR) 20 MG tablet Take 1 tablet (20 mg total) by mouth daily at 6 PM. 10/05/18   Storm Frisk, MD  fluconazole (DIFLUCAN) 100 MG tablet Take 1 tablet (100 mg total) by mouth daily for 12 days. 10/16/18 10/28/18  Jerald Kief, MD  FLUoxetine (PROZAC) 40 MG capsule Take 1 capsule (40 mg total) by mouth at bedtime. 10/05/18   Storm Frisk, MD  glucagon  1 MG injection Use for Severe Hypoglycemia . Inject 1 mg intramuscularly if unresponsive, unable to swallow, unconscious and/or has seizure Patient taking differently: Inject 1 mg into the muscle once as needed (for severe hypoglycemia- if unresponsive, unable to swallow, unconscious, and/or has had seizure(s)).  07/20/13 12/19/25  Romero Belling, MD  hydrOXYzine (ATARAX/VISTARIL) 25 MG tablet Take 1 tablet (25 mg total) by mouth 3 (three) times daily as needed for anxiety. 10/05/18   Storm Frisk, MD  Insulin Degludec (TRESIBA) 100 UNIT/ML SOLN Inject 52 Units into the skin daily. 10/05/18   Storm Frisk, MD  insulin lispro (HUMALOG) 100 UNIT/ML injection Inject 0.15 mLs (15 Units total) into the skin 2 (two) times daily with a meal. 10/05/18   Storm Frisk, MD  lamoTRIgine (LAMICTAL) 25 MG tablet Take 1 tablet (25 mg total) by mouth 2 (two) times daily. 10/05/18   Storm Frisk, MD  metoCLOPramide (REGLAN) 10 MG tablet Take 1 tablet (10 mg total) by mouth 4 (four) times daily -  before meals and at bedtime. 10/15/18 11/14/18  Jerald Kief, MD  Multiple Vitamin (MULTIVITAMIN WITH MINERALS) TABS tablet Take 1 tablet by mouth daily. 09/18/18   Clapacs, Jackquline Denmark, MD  pantoprazole (PROTONIX) 40 MG tablet Take 1 tablet (40 mg total) by mouth daily. 10/16/18 11/15/18  Jerald Kief, MD  promethazine (PHENERGAN) 25 MG suppository Place 1 suppository (25 mg total) rectally every 6 (six) hours as needed for nausea or vomiting. 10/16/18   Liberty Handy, PA-C  propranolol (INDERAL) 10 MG tablet Take 1 tablet (10 mg total) by mouth 3 (three) times daily. 10/15/18 11/14/18  Jerald Kief, MD  topiramate (TOPAMAX) 25 MG tablet Take 1 tablet (25 mg total) by mouth 2 (two) times daily. 10/05/18   Storm Frisk, MD  traZODone (DESYREL) 100 MG tablet Take 3 tablets (300 mg total) by mouth at bedtime. 10/05/18   Hoy Register, MD    Family History Family History  Problem Relation Age of Onset  .  Diabetes Mother   . Hypertension Mother   . Obesity Mother   . Asthma Mother   . Allergic rhinitis Mother   . Eczema Mother   . Diabetes Father   . Hypertension Father   . Obesity Father   . Hyperlipidemia Father   . Hypertension Paternal Aunt   . Hypertension Maternal Grandfather   . Colon cancer Maternal Grandfather   . Diabetes Paternal Grandmother   . Obesity Paternal Grandmother   . Diabetes Paternal Grandfather   . Obesity Paternal Grandfather   . Angioedema Neg Hx   . Immunodeficiency Neg Hx   . Urticaria Neg Hx   . Stomach cancer Neg Hx   . Esophageal cancer Neg Hx     Social History Social History   Tobacco Use  . Smoking status: Never Smoker  . Smokeless tobacco:  Never Used  Substance Use Topics  . Alcohol use: No    Alcohol/week: 0.0 standard drinks  . Drug use: No     Allergies   Ibuprofen   Review of Systems Review of Systems  Constitutional: Negative for chills and fever.  HENT: Negative.   Respiratory: Negative.  Negative for shortness of breath.   Cardiovascular: Positive for chest pain.  Gastrointestinal: Positive for abdominal pain, blood in stool, constipation, nausea and vomiting.  Musculoskeletal: Negative.   Skin: Negative.   Neurological: Positive for seizures.     Physical Exam Updated Vital Signs BP (!) 156/100   Pulse (!) 109   Temp 98.8 F (37.1 C) (Oral)   Resp 16   LMP 10/19/2018   SpO2 98%   Physical Exam Vitals signs and nursing note reviewed.  Constitutional:      General: She is not in acute distress.    Appearance: She is well-developed. She is obese. She is not toxic-appearing.  HENT:     Head: Normocephalic.  Neck:     Musculoskeletal: Normal range of motion and neck supple.  Cardiovascular:     Rate and Rhythm: Regular rhythm. Tachycardia present.     Heart sounds: No murmur.  Pulmonary:     Effort: Pulmonary effort is normal.     Breath sounds: Normal breath sounds.  Abdominal:     General: Bowel  sounds are normal.     Palpations: Abdomen is soft.     Tenderness: There is abdominal tenderness (Soft abdomen, generally tender.). There is no guarding or rebound.  Musculoskeletal: Normal range of motion.  Skin:    General: Skin is warm and dry.     Findings: No rash.  Neurological:     Mental Status: She is alert and oriented to person, place, and time.      ED Treatments / Results  Labs (all labs ordered are listed, but only abnormal results are displayed) Labs Reviewed  COMPREHENSIVE METABOLIC PANEL - Abnormal; Notable for the following components:      Result Value   Sodium 133 (*)    Glucose, Bld 305 (*)    All other components within normal limits  LIPASE, BLOOD  CBC  URINALYSIS, ROUTINE W REFLEX MICROSCOPIC  I-STAT BETA HCG BLOOD, ED ([facility], [facility], [facility] ONLY)  POC OCCULT BLOOD, ED    EKG None  Radiology No results found.  Procedures Procedures (including critical care time)  Medications Ordered in ED Medications  sodium chloride flush (NS) 0.9 % injection 3 mL (3 mLs Intravenous Not Given 10/24/18 2136)  sodium chloride 0.9 % bolus 1,000 mL (has no administration in time range)  promethazine (PHENERGAN) injection 12.5 mg (has no administration in time range)     Initial Impression / Assessment and Plan / ED Course  I have reviewed the triage vital signs and the nursing notes.  Pertinent labs & imaging results that were available during my care of the patient were reviewed by me and considered in my medical decision making (see chart for details).        Patient to ED with persistent nausea, vomiting, abdominal and chest pain since prior to last admission for same. She reports unwitnessed seizure activity at home.  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 She was taken off Keppra and was to continue Topamax and lamictal at home.  Cardiac cath 10/07 - no obstructive  disease EGD 10/06 -  mild gastritis, mild candidal esophagitis, recommend continuation of Protonix.  1:10 - Patient found on the floor by nursing. No shaking activity, eyes open. Uncooperative for nurses. I went into the room and asked the patient to get up off the floor. I informed her that on chart review that this is found to be her normal/recurrent behavior and that she could get herself up and into bed, at which time she got up without assistance or complaint and got into bed.   No further emesis since initial episode on arrival.   1:40 - UA negative for infection.   Patient has hyperglycemia but no sign of DKA. No further emesis since arrival. She continues to complain of nausea, which may be chronic for her. She has GI follow up this week. Encouraged to continue medications. She has antiemetics at home. She can be discharged home appropriately.  Final Clinical Impressions(s) / ED Diagnoses   Final diagnoses:  None   1. Chronic abdominal pain 2. Chronic nausea  ED Discharge Orders    None       Elpidio AnisUpstill, Pierson Vantol, PA-C 10/25/18 0144    Charlynne PanderYao, David Hsienta, MD 10/26/18 [phone]

## 2018-10-24 NOTE — ED Notes (Addendum)
Pt called nurse over to state that she thinks she had a seizure a few minutes ago, vitals rechecked

## 2018-10-24 NOTE — ED Triage Notes (Signed)
Pt arrives via EMS from home with reports of N/V. Had an endoscopy on the 10th. Last taken insulin last night. 4 mg zofran given by EMS. Pt states she had a seizure today lasting 10 min but was unwitnessed. EMS reports normal 12 lead.

## 2018-10-24 NOTE — ED Notes (Signed)
Pt has vomited x2 since arrival.

## 2018-10-24 NOTE — ED Notes (Signed)
Pt states that she is now having CP, EKG ordered

## 2018-10-25 LAB — URINALYSIS, ROUTINE W REFLEX MICROSCOPIC
Bacteria, UA: NONE SEEN
Bilirubin Urine: NEGATIVE
Glucose, UA: >=500 mg/dL — AB
Ketones, ur: 80 mg/dL — AB
Leukocytes,Ua: NEGATIVE
Nitrite: NEGATIVE
Protein, ur: NEGATIVE mg/dL
Specific Gravity, Urine: 1.037 — ABNORMAL HIGH (ref 1.005–1.030)
pH: 6 (ref 5.0–8.0)

## 2018-10-25 LAB — POC OCCULT BLOOD, ED: Fecal Occult Bld: NEGATIVE

## 2018-10-25 MED ORDER — PROMETHAZINE HCL 25 MG/ML IJ SOLN
6.2500 mg | Freq: Once | INTRAMUSCULAR | Status: AC
  Administered 2018-10-25: 02:00:00 6.25 mg via INTRAVENOUS
  Filled 2018-10-25: qty 1

## 2018-10-25 MED ORDER — PROMETHAZINE HCL 25 MG/ML IJ SOLN: 12.5000 mg | Freq: Once | INTRAMUSCULAR | Status: DC

## 2018-10-25 NOTE — ED Notes (Addendum)
Walked in to check on patient and found her on the floor, sleeping. Provider notified and states that this is normal behavior for this patient.

## 2018-10-25 NOTE — Discharge Instructions (Addendum)
Keep your scheduled appointment with GI this week. Continue your current medications.

## 2018-10-25 NOTE — ED Notes (Signed)
Staff asked the pt to use the restroom to provide a sample, the pt said she doesn't have to go right now and won't try.

## 2018-10-27 ENCOUNTER — Ambulatory Visit (INDEPENDENT_AMBULATORY_CARE_PROVIDER_SITE_OTHER): Payer: Medicaid Other | Admitting: Physician Assistant

## 2018-10-27 ENCOUNTER — Encounter: Payer: Self-pay | Admitting: Physician Assistant

## 2018-10-27 ENCOUNTER — Other Ambulatory Visit: Payer: Self-pay

## 2018-10-27 VITALS — BP 100/60 | HR 100 | Temp 98.3°F | Ht 63.0 in | Wt 191.4 lb

## 2018-10-27 DIAGNOSIS — K3184 Gastroparesis: Secondary | ICD-10-CM | POA: Diagnosis not present

## 2018-10-27 DIAGNOSIS — K5904 Chronic idiopathic constipation: Secondary | ICD-10-CM

## 2018-10-27 DIAGNOSIS — R112 Nausea with vomiting, unspecified: Secondary | ICD-10-CM

## 2018-10-27 DIAGNOSIS — E1143 Type 2 diabetes mellitus with diabetic autonomic (poly)neuropathy: Secondary | ICD-10-CM

## 2018-10-27 MED ORDER — LUBIPROSTONE 24 MCG PO CAPS: 24.0000 ug | ORAL_CAPSULE | Freq: Two times a day (BID) | ORAL | 6 refills | Status: DC

## 2018-10-27 MED FILL — AMITIZA 24 MCG CAPSULES: 24 | 30 days supply | Qty: 60 | Fill #0

## 2018-10-27 NOTE — Patient Instructions (Addendum)
If you are age 20 or older, your body mass index should be between 23-30. Your Body mass index is 33.9 kg/m. If this is out of the aforementioned range listed, please consider follow up with your Primary Care Provider.  If you are age 46 or younger, your body mass index should be between 19-25. Your Body mass index is 33.9 kg/m. If this is out of the aformentioned range listed, please consider follow up with your Primary Care Provider.   Medication Samples have been provided to the patient.  Drug name: Amitiza       Strength: 28mcg        Qty: 12  LOT: [id number]  Exp.Date: 05-2020  Dosing instructions: Take twice a day, if stool are too loose back down to one a day.  Continue current medication Regimen. No additional changes.   Most importantly keep your sugars under control.  It was a pleasure to see you today!

## 2018-10-28 ENCOUNTER — Encounter: Payer: Self-pay | Admitting: Physician Assistant

## 2018-10-28 NOTE — Progress Notes (Signed)
Agree with assessment and plan as outlined.  

## 2018-10-28 NOTE — Progress Notes (Signed)
Subjective:    Patient ID: Nancy [name], female    DOB: 2000/03/31, 18 y.o.   MRN: [id number]  HPI Nancy [name] is a 18 year old African-American female, established with Dr. Adela Lank.  Patient has history of insulin-dependent diabetes mellitus with frequent admissions to the hospital with complaints of nausea vomiting and DKA.  Also with hypertension, echogenic seizures, depression, conversion disorder, chronic abdominal pain and carries diagnosis of gastroparesis. She has had a couple of admissions in the past 4 to 6 weeks.  Most recently admitted 10/2 through 10/15/2018, found on the floor at home by her family and brought in by EMS.  She was found to have evidence of DKA but also felt to have conversion disorder with pseudoseizure episodes as likely cause for being found down.  She has recently continued to complain of nausea and vomiting and continues to report that she vomits up blood, and black material however on 2 recent admission she has had no evidence of active GI bleeding with perfectly stable hemoglobins.  She had a EGD on 10/12/2018 which showed some retained food in the stomach and mild gastritis and mild distal candidiasis but no findings which should cause any GI bleeding. She also underwent cardiac cath during that admission which was negative. Since discharge from the hospital she had had 3 more ER visits. Today she is pleasant, conversant.  Says she still vomiting every day and bringing up black material or blood.  When I told her that we have not found any evidence of her bleeding from her stomach or her esophagus, she said "I know".  She states she had finished the fluconazole for the candidiasis. She asked me about her chronic abdominal pain and what may be causing that.  She also states she has been having a lot of problems with constipation and may only have 1 bowel movement per week frequently. She says she has been taking all of her medications as directed including  metoclopramide, pantoprazole and uses Phenergan suppositories as needed.  Review of Systems Pertinent positive and negative review of systems were noted in the above HPI section.  All other review of systems was otherwise negative.  Outpatient Encounter Medications as of 10/27/2018  Medication Sig  . amLODipine (NORVASC) 5 MG tablet Take 1 tablet (5 mg total) by mouth daily.  . ARIPiprazole (ABILIFY) 20 MG tablet Take 20 mg by mouth daily.  Marland Kitchen atorvastatin (LIPITOR) 20 MG tablet Take 1 tablet (20 mg total) by mouth daily at 6 PM.  . FLUoxetine (PROZAC) 40 MG capsule Take 1 capsule (40 mg total) by mouth at bedtime.  . hydrOXYzine (ATARAX/VISTARIL) 25 MG tablet Take 1 tablet (25 mg total) by mouth 3 (three) times daily as needed for anxiety.  . Insulin Degludec (TRESIBA) 100 UNIT/ML SOLN Inject 52 Units into the skin daily.  . insulin lispro (HUMALOG) 100 UNIT/ML injection Inject 0.15 mLs (15 Units total) into the skin 2 (two) times daily with a meal.  . lamoTRIgine (LAMICTAL) 25 MG tablet Take 1 tablet (25 mg total) by mouth 2 (two) times daily.  . metoCLOPramide (REGLAN) 10 MG tablet Take 1 tablet (10 mg total) by mouth 4 (four) times daily -  before meals and at bedtime.  . Multiple Vitamin (MULTIVITAMIN WITH MINERALS) TABS tablet Take 1 tablet by mouth daily.  . pantoprazole (PROTONIX) 40 MG tablet Take 1 tablet (40 mg total) by mouth daily.  . promethazine (PHENERGAN) 25 MG suppository Place 1 suppository (25 mg total) rectally every 6 (six)  hours as needed for nausea or vomiting.  . propranolol (INDERAL) 10 MG tablet Take 1 tablet (10 mg total) by mouth 3 (three) times daily.  Marland Kitchen topiramate (TOPAMAX) 25 MG tablet Take 1 tablet (25 mg total) by mouth 2 (two) times daily.  . traZODone (DESYREL) 100 MG tablet Take 3 tablets (300 mg total) by mouth at bedtime.  Marland Kitchen glucagon 1 MG injection Use for Severe Hypoglycemia . Inject 1 mg intramuscularly if unresponsive, unable to swallow, unconscious  and/or has seizure (Patient not taking: Reported on 10/27/2018)  . lubiprostone (AMITIZA) 24 MCG capsule Take 1 capsule (24 mcg total) by mouth 2 (two) times daily with a meal.  . [DISCONTINUED] fluconazole (DIFLUCAN) 100 MG tablet Take 1 tablet (100 mg total) by mouth daily for 12 days.   No facility-administered encounter medications on file as of 10/27/2018.    Allergies  Allergen Reactions  . Ibuprofen Other (See Comments)    GI MD said to not take this anymore   Patient Active Problem List   Diagnosis Date Noted  . Nonspecific abnormal electrocardiogram (ECG) (EKG)   . Chest pain of uncertain etiology   . DKA, type 1 ([place]) 10/08/2018  . Hypertensive urgency 10/08/2018  . Conversion disorder with attacks or seizures, acute episode, with psychological stressor 09/16/2018  . Suicidal ideations 09/13/2018  . MDD (major depressive disorder), recurrent severe, without psychosis ([place])   . Syncope 01/30/2018  . Orthostatic hypotension 01/24/2018  . Chronic abdominal pain 12/24/2017  . Chest pain 12/19/2017  . Nausea and vomiting 08/21/2017  . Generalized abdominal pain 08/21/2017  . Non compliance with medical treatment 01/27/2012  . Adjustment disorder 09/16/2011  . DM (diabetes mellitus), type 1, uncontrolled ([place]) 09/15/2011  . Acanthosis nigricans   . Goiter   . Obesity 06/14/2010  . Hypertension 06/14/2010   Social History   Socioeconomic History  . Marital status: Single    Spouse name: Not on file  . Number of children: 0  . Years of education: Not on file  . Highest education level: Not on file  Occupational History  . Occupation: Admission  Social Needs  . Financial resource strain: Not on file  . Food insecurity    Worry: Not on file    Inability: Not on file  . Transportation needs    Medical: Not on file    Non-medical: Not on file  Tobacco Use  . Smoking status: Never Smoker  . Smokeless tobacco: Never Used  Substance and Sexual Activity  . Alcohol  use: No    Alcohol/week: 0.0 standard drinks  . Drug use: No  . Sexual activity: Never  Lifestyle  . Physical activity    Days per week: Not on file    Minutes per session: Not on file  . Stress: Not on file  Relationships  . Social Herbalist on phone: Not on file    Gets together: Not on file    Attends religious service: Not on file    Active member of club or organization: Not on file    Attends meetings of clubs or organizations: Not on file    Relationship status: Not on file  . Intimate partner violence    Fear of current or ex partner: Not on file    Emotionally abused: Not on file    Physically abused: Not on file    Forced sexual activity: Not on file  Other Topics Concern  . Not on file  Social History  Narrative   Lives with mom and mom's girlfriend.   No EtOH, tobacco, Drugs    Ms. Bair's family history includes Allergic rhinitis in her mother; Asthma in her mother; Cervical cancer in her mother; Colon cancer in her maternal grandfather; Diabetes in her father, mother, paternal grandfather, and paternal grandmother; Eczema in her mother; Hyperlipidemia in her father; Hypertension in her father, maternal grandfather, mother, and paternal aunt; Obesity in her father, mother, paternal grandfather, and paternal grandmother.      Objective:    Vitals:   10/27/18 1401  BP: 100/60  Pulse: 100  Temp: 98.3 F (36.8 C)    Physical Exam Well-developed well-nourished young African-American female in no acute distress.  Height, Weight, 191   HEENT; nontraumatic normocephalic, EOMI, PE RR LA, sclera anicteric. Oropharynx; tongue benign, no obvious candidiasis Neck; supple, no JVD Cardiovascular; regular rate and rhythm with S1-S2, no murmur rub or gallop Pulmonary; Clear bilaterally Abdomen; soft, obese she has mild diffuse tenderness to very light palpation, nondistended, no palpable mass or hepatosplenomegaly, bowel sounds are active Rectal; not done  Skin; benign exam, no jaundice rash or appreciable lesions Extremities; no clubbing cyanosis or edema skin warm and dry Neuro/Psych; alert and oriented x4, grossly nonfocal mood and affect appropriate       Assessment & Plan:  #2 18 year old African-American female with insulin-dependent diabetes mellitus, with recurrent admissions with DKA, also with chronic complaints of nausea vomiting chronic complaints of hematemesis and coffee-ground emesis without any objective evidence for such. 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 every morning Continue metoclopramide 10 mg AC meals and at at bedtime Continue Phenergan suppository every 6 hours as needed for nausea and vomiting. Will start trial of Amitiza 24 mcg twice daily for chronic constipation, she is advised that if this causes her stools to become too loose to decrease to 24 mcg once daily.  Of also asked her to call back for advice if the Amitiza is ineffective. She can follow-up with Dr. Adela Lank or myself as needed.   Toshi Ishii Oswald Hillock PA-C 10/28/2018   Cc: Claiborne Rigg, NP

## 2018-10-30 ENCOUNTER — Emergency Department (HOSPITAL_COMMUNITY): Payer: Medicaid Other

## 2018-10-30 ENCOUNTER — Other Ambulatory Visit: Payer: Self-pay

## 2018-10-30 ENCOUNTER — Emergency Department (HOSPITAL_COMMUNITY)
Admission: EM | Admit: 2018-10-30 | Discharge: 2018-10-31 | Disposition: A | Discharge status: Home / Self Care | Payer: Medicaid Other | Attending: Emergency Medicine | Admitting: Emergency Medicine

## 2018-10-30 DIAGNOSIS — I110 Hypertensive heart disease with heart failure: Secondary | ICD-10-CM | POA: Insufficient documentation

## 2018-10-30 DIAGNOSIS — E119 Type 2 diabetes mellitus without complications: Secondary | ICD-10-CM | POA: Insufficient documentation

## 2018-10-30 DIAGNOSIS — I509 Heart failure, unspecified: Secondary | ICD-10-CM | POA: Diagnosis not present

## 2018-10-30 DIAGNOSIS — Z794 Long term (current) use of insulin: Secondary | ICD-10-CM | POA: Insufficient documentation

## 2018-10-30 DIAGNOSIS — Z79899 Other long term (current) drug therapy: Secondary | ICD-10-CM | POA: Insufficient documentation

## 2018-10-30 DIAGNOSIS — R569 Unspecified convulsions: Secondary | ICD-10-CM

## 2018-10-30 LAB — RAPID URINE DRUG SCREEN, HOSP PERFORMED
Amphetamines: NOT DETECTED
Barbiturates: NOT DETECTED
Benzodiazepines: NOT DETECTED
Cocaine: NOT DETECTED
Opiates: POSITIVE — AB
Tetrahydrocannabinol: NOT DETECTED

## 2018-10-30 LAB — CBC WITH DIFFERENTIAL/PLATELET
Abs Immature Granulocytes: 0.03 10*3/uL (ref 0.00–0.07)
Basophils Absolute: 0 10*3/uL (ref 0.0–0.1)
Basophils Relative: 0 %
Eosinophils Absolute: 0.1 10*3/uL (ref 0.0–0.5)
Eosinophils Relative: 1 %
HCT: 37.8 % (ref 36.0–46.0)
Hemoglobin: 12.1 g/dL (ref 12.0–15.0)
Immature Granulocytes: 0 %
Lymphocytes Relative: 24 %
Lymphs Abs: 2.5 10*3/uL (ref 0.7–4.0)
MCH: 27.8 pg (ref 26.0–34.0)
MCHC: 32 g/dL (ref 30.0–36.0)
MCV: 86.9 fL (ref 80.0–100.0)
Monocytes Absolute: 0.8 10*3/uL (ref 0.1–1.0)
Monocytes Relative: 8 %
Neutro Abs: 7.2 10*3/uL (ref 1.7–7.7)
Neutrophils Relative %: 67 %
Platelets: 294 10*3/uL (ref 150–400)
RBC: 4.35 MIL/uL (ref 3.87–5.11)
RDW: 13 % (ref 11.5–15.5)
WBC: 10.7 10*3/uL — ABNORMAL HIGH (ref 4.0–10.5)
nRBC: 0 % (ref 0.0–0.2)

## 2018-10-30 LAB — D-DIMER, QUANTITATIVE: D-Dimer, Quant: <0.27 ug/mL-FEU (ref 0.00–0.50)

## 2018-10-30 LAB — URINALYSIS, ROUTINE W REFLEX MICROSCOPIC
Bacteria, UA: NONE SEEN
Bilirubin Urine: NEGATIVE
Glucose, UA: >=500 mg/dL — AB
Hgb urine dipstick: NEGATIVE
Ketones, ur: NEGATIVE mg/dL
Leukocytes,Ua: NEGATIVE
Nitrite: NEGATIVE
Protein, ur: NEGATIVE mg/dL
Specific Gravity, Urine: 1.028 (ref 1.005–1.030)
pH: 6 (ref 5.0–8.0)

## 2018-10-30 LAB — CBG MONITORING, ED
Glucose-Capillary: 355 mg/dL — ABNORMAL HIGH (ref 70–99)
Glucose-Capillary: 229 mg/dL — ABNORMAL HIGH (ref 70–99)

## 2018-10-30 LAB — COMPREHENSIVE METABOLIC PANEL
ALT: 16 U/L (ref 0–44)
AST: 16 U/L (ref 15–41)
Albumin: 3.5 g/dL (ref 3.5–5.0)
Alkaline Phosphatase: 79 U/L (ref 38–126)
Anion gap: 12 (ref 5–15)
BUN: 8 mg/dL (ref 6–20)
CO2: 25 mmol/L (ref 22–32)
Calcium: 9.3 mg/dL (ref 8.9–10.3)
Chloride: 100 mmol/L (ref 98–111)
Creatinine, Ser: 0.69 mg/dL (ref 0.44–1.00)
GFR calc Af Amer: >60 mL/min (ref >60)
GFR calc non Af Amer: >60 mL/min (ref >60)
Glucose, Bld: 351 mg/dL — ABNORMAL HIGH (ref 70–99)
Potassium: 4.2 mmol/L (ref 3.5–5.1)
Sodium: 137 mmol/L (ref 135–145)
Total Bilirubin: 0.5 mg/dL (ref 0.3–1.2)
Total Protein: 5.8 g/dL — ABNORMAL LOW (ref 6.5–8.1)

## 2018-10-30 LAB — I-STAT BETA HCG BLOOD, ED (MC, WL, AP ONLY): I-stat hCG, quantitative: <5 m[IU]/mL (ref <5)

## 2018-10-30 LAB — TROPONIN I (HIGH SENSITIVITY): Troponin I (High Sensitivity): <2 ng/L (ref <18)

## 2018-10-30 MED ORDER — SODIUM CHLORIDE 0.9 % IV BOLUS
1000.0000 mL | Freq: Once | INTRAVENOUS | Status: AC
  Administered 2018-10-30: 17:00:00 1000 mL via INTRAVENOUS

## 2018-10-30 MED ORDER — METOCLOPRAMIDE HCL 5 MG/ML IJ SOLN
5.0000 mg | Freq: Once | INTRAMUSCULAR | Status: AC
  Administered 2018-10-30: 17:00:00 5 mg via INTRAVENOUS
  Filled 2018-10-30: qty 2

## 2018-10-30 MED ORDER — ACETAMINOPHEN 325 MG PO TABS
650.0000 mg | ORAL_TABLET | Freq: Once | ORAL | Status: AC
  Administered 2018-10-30: 17:00:00 650 mg via ORAL
  Filled 2018-10-30: qty 2

## 2018-10-30 MED ORDER — MORPHINE SULFATE (PF) 4 MG/ML IV SOLN
4.0000 mg | Freq: Once | INTRAVENOUS | Status: AC
  Administered 2018-10-30: 4 mg via INTRAVENOUS
  Filled 2018-10-30: qty 1

## 2018-10-30 NOTE — ED Notes (Signed)
Pt able to tolerate PO without nausea/vomiting 

## 2018-10-30 NOTE — ED Triage Notes (Signed)
BIB EMS. Mother reports 15 seizures since this morning. Around 12 started having nausea, vomiting and chest pain rated as 10/10. EMS placed c-collar d/t fall during seizure and also c/o back pain from fall. 324mg  aspirin, 1 nitro, and 4mg  Zofran administered prior to arrival with no relief. Pt is alert and oriented. EMS reports pt was able to ambulate to truck. CBG 493.

## 2018-10-30 NOTE — ED Provider Notes (Signed)
[facility] EMERGENCY DEPARTMENT Provider Note   CSN: [id number] Arrival date & time: 10/30/18  1620     History   Chief Complaint Chief Complaint  Patient presents with   Seizures    HPI Nancy [name] is a 18 y.o. female.     HPI   Patient is a 18 year old female with a history of anxiety/depression, CHF, diabetes, seizures?/conversion disorder?,  Who presents to the emergency department today for evaluation of reported seizures at home.  Patient states that she has had greater than 15 seizures that were witnessed by her mother earlier today.  She describes them as total body shaking per mother.  States her mother became concerned because one of the episodes lasted about 15 minutes.  She states that she is fallen at least twice today and sustained head trauma.  She is complaining of headache, neck pain and back pain.  She also reports that she started having left upper and left lower extremity numbness/weakness starting around 12 or 1230 today.  She is not exactly sure when it started.  She states that it has improved since onset.  She is also experiencing pain to the entire left leg.  She notes that she feels like it is been swollen starting today.  She reports compliance with her seizure medications.  States that she is on Lamictal and Keppra.  She reports that her Keppra was recently increased to 1000 mg twice daily around 10/21.  She states this was increased her because she was having breakthrough seizures.  She sees [facility] neurology.  NV/abd pain: She also notes that she has had nausea, vomiting and diffuse abdominal pain.  This is a chronic issue for her and she follows with GI for it.   Chest pain: She also complains of left-sided chest pain she denies a cough, shortness of breath or pleuritic pain.  She is not on birth control. Denies hemoptysis.   Past Medical History:  Diagnosis Date   Acanthosis nigricans    Anxiety    CHF (congestive heart  failure) (HCC)    Chronic lower back pain    Depression    DKA, type 1 (HCC) 09/13/2018   Dyspepsia    Obesity    Ovarian cyst    pt is not aware of this hx (11/24/2017)   Pre-diabetes    Precocious adrenarche (HCC)    Premature baby    Seizures (HCC)    Type II diabetes mellitus (HCC)    insulin dependant    Patient Active Problem List   Diagnosis Date Noted   Nonspecific abnormal electrocardiogram (ECG) (EKG)    Chest pain of uncertain etiology    DKA, type 1 (HCC) 10/08/2018   Hypertensive urgency 10/08/2018   Conversion disorder with attacks or seizures, acute episode, with psychological stressor 09/16/2018   Suicidal ideations 09/13/2018   MDD (major depressive disorder), recurrent severe, without psychosis (HCC)    Syncope 01/30/2018   Orthostatic hypotension 01/24/2018   Chronic abdominal pain 12/24/2017   Chest pain 12/19/2017   Nausea and vomiting 08/21/2017   Generalized abdominal pain 08/21/2017   Non compliance with medical treatment 01/27/2012   Adjustment disorder 09/16/2011   DM (diabetes mellitus), type 1, uncontrolled (HCC) 09/15/2011   Acanthosis nigricans    Goiter    Obesity 06/14/2010   Hypertension 06/14/2010    Past Surgical History:  Procedure Laterality Date   ABDOMINAL HERNIA REPAIR     "I was a baby"   BIOPSY  10/12/2018   Procedure: BIOPSY;  Surgeon: Lynann BolognaGupta, Rajesh, MD;  [facility];  Service: Endoscopy;;   ESOPHAGOGASTRODUODENOSCOPY (EGD) WITH PROPOFOL N/A 10/12/2018   Procedure: ESOPHAGOGASTRODUODENOSCOPY (EGD) WITH PROPOFOL;  Surgeon: Lynann BolognaGupta, Rajesh, MD;  [facility];  Service: Endoscopy;  Laterality: N/A;   HERNIA REPAIR     LEFT HEART CATH AND CORONARY ANGIOGRAPHY N/A 10/13/2018   Procedure: LEFT HEART CATH AND CORONARY ANGIOGRAPHY;  Surgeon: Kathleene HazelMcAlhany, Christopher D, MD;  Location: [facility] INVASIVE CV LAB;  Service: Cardiovascular;  Laterality: N/A;   TONSILLECTOMY AND ADENOIDECTOMY       WISDOM TOOTH EXTRACTION  2017     OB History   No obstetric history on file.      Home Medications    Prior to Admission medications   Medication Sig Start Date End Date Taking? Authorizing Provider  amLODipine (NORVASC) 5 MG tablet Take 1 tablet (5 mg total) by mouth daily. 10/16/18 11/15/18 Yes Jerald Kiefhiu, Stephen K, MD  ARIPiprazole (ABILIFY) 20 MG tablet Take 20 mg by mouth daily.   Yes [provider]  atorvastatin (LIPITOR) 20 MG tablet Take 1 tablet (20 mg total) by mouth daily at 6 PM. 10/05/18  Yes Storm FriskWright, Patrick E, MD  FLUoxetine (PROZAC) 40 MG capsule Take 1 capsule (40 mg total) by mouth at bedtime. 10/05/18  Yes Storm FriskWright, Patrick E, MD  hydrOXYzine (ATARAX/VISTARIL) 25 MG tablet Take 1 tablet (25 mg total) by mouth 3 (three) times daily as needed for anxiety. 10/05/18  Yes Storm FriskWright, Patrick E, MD  Insulin Degludec (TRESIBA) 100 UNIT/ML SOLN Inject 52 Units into the skin daily. 10/05/18  Yes Storm FriskWright, Patrick E, MD  insulin lispro (HUMALOG) 100 UNIT/ML injection Inject 0.15 mLs (15 Units total) into the skin 2 (two) times daily with a meal. 10/05/18  Yes Storm FriskWright, Patrick E, MD  lamoTRIgine (LAMICTAL) 25 MG tablet Take 1 tablet (25 mg total) by mouth 2 (two) times daily. 10/05/18  Yes Storm FriskWright, Patrick E, MD  lubiprostone (AMITIZA) 24 MCG capsule Take 1 capsule (24 mcg total) by mouth 2 (two) times daily with a meal. 10/27/18  Yes Esterwood, Amy S, PA-C  metoCLOPramide (REGLAN) 10 MG tablet Take 1 tablet (10 mg total) by mouth 4 (four) times daily -  before meals and at bedtime. 10/15/18 11/14/18 Yes Jerald Kiefhiu, Stephen K, MD  Multiple Vitamin (MULTIVITAMIN WITH MINERALS) TABS tablet Take 1 tablet by mouth daily. 09/18/18  Yes Clapacs, Jackquline DenmarkJohn T, MD  pantoprazole (PROTONIX) 40 MG tablet Take 1 tablet (40 mg total) by mouth daily. 10/16/18 11/15/18 Yes Jerald Kiefhiu, Stephen K, MD  promethazine (PHENERGAN) 25 MG suppository Place 1 suppository (25 mg total) rectally every 6 (six) hours as needed for nausea or  vomiting. 10/16/18  Yes Liberty HandyGibbons, Claudia J, PA-C  propranolol (INDERAL) 10 MG tablet Take 1 tablet (10 mg total) by mouth 3 (three) times daily. 10/15/18 11/14/18 Yes Jerald Kiefhiu, Stephen K, MD  topiramate (TOPAMAX) 25 MG tablet Take 1 tablet (25 mg total) by mouth 2 (two) times daily. 10/05/18  Yes Storm FriskWright, Patrick E, MD  traZODone (DESYREL) 100 MG tablet Take 3 tablets (300 mg total) by mouth at bedtime. 10/05/18  Yes Hoy RegisterNewlin, Enobong, MD    Family History Family History  Problem Relation Age of Onset   Diabetes Mother    Hypertension Mother    Obesity Mother    Asthma Mother    Allergic rhinitis Mother    Eczema Mother    Cervical cancer Mother    Diabetes Father  Hypertension Father    Obesity Father    Hyperlipidemia Father    Hypertension Paternal Aunt    Hypertension Maternal Grandfather    Colon cancer Maternal Grandfather    Diabetes Paternal Grandmother    Obesity Paternal Grandmother    Diabetes Paternal Grandfather    Obesity Paternal Grandfather    Angioedema Neg Hx    Immunodeficiency Neg Hx    Urticaria Neg Hx    Stomach cancer Neg Hx    Esophageal cancer Neg Hx     Social History Social History   Tobacco Use   Smoking status: Never Smoker   Smokeless tobacco: Never Used  Substance Use Topics   Alcohol use: No    Alcohol/week: 0.0 standard drinks   Drug use: No     Allergies   Ibuprofen   Review of Systems Review of Systems  Constitutional: Negative for chills and fever.  HENT: Negative for ear pain and sore throat.   Eyes: Negative for pain and visual disturbance.  Respiratory: Negative for cough and shortness of breath.   Cardiovascular: Negative for chest pain and palpitations.  Gastrointestinal: Negative for abdominal pain and vomiting.  Genitourinary: Negative for dysuria and hematuria.  Musculoskeletal: Positive for back pain and neck pain.       Left leg pain  Skin: Negative for color change and rash.  Neurological:  Positive for seizures, weakness, numbness and headaches. Negative for dizziness, syncope and light-headedness.       Head trauma  All other systems reviewed and are negative.    Physical Exam Updated Vital Signs BP 121/86    Pulse 87    Temp 99.4 F (37.4 C) (Oral)    Resp (!) 22    LMP 10/19/2018    SpO2 99%   Physical Exam Vitals signs and nursing note reviewed.  Constitutional:      General: She is not in acute distress.    Appearance: She is well-developed. She is not ill-appearing or toxic-appearing.  HENT:     Head: Normocephalic and atraumatic.  Eyes:     Conjunctiva/sclera: Conjunctivae normal.  Neck:     Musculoskeletal: Neck supple.  Cardiovascular:     Rate and Rhythm: Regular rhythm. Tachycardia present.     Pulses: Normal pulses.     Heart sounds: Normal heart sounds. No murmur.  Pulmonary:     Effort: Pulmonary effort is normal. No respiratory distress.     Breath sounds: Normal breath sounds. No wheezing, rhonchi or rales.  Chest:     Chest wall: No tenderness.  Abdominal:     General: Bowel sounds are normal.     Palpations: Abdomen is soft.     Tenderness: There is abdominal tenderness (mild generalized). There is no guarding or rebound.  Musculoskeletal:     Comments: TTP to the cervical and thoracic spine  Skin:    General: Skin is warm and dry.  Neurological:     Mental Status: She is alert.     Comments: Mental Status:  Alert, thought content appropriate, able to give a coherent history. Speech fluent without evidence of aphasia. Able to follow 2 step commands without difficulty.  Cranial Nerves:  II:  pupils equal, round, reactive to light III,IV, VI: ptosis not present, extra-ocular motions intact bilaterally  V,VII: smile symmetric, facial light touch sensation equal VIII: hearing grossly normal to voice  X: uvula elevates symmetrically  XI: bilateral shoulder shrug symmetric and strong XII: midline tongue extension without  fassiculations Motor:  Normal tone. 5/5 strength of BUE and BLE major muscle groups including strong and equal grip strength and dorsiflexion/plantar flexion Sensory: reports abnormal sensation to the LUE/LLE CV: 2+ radial and DP pulses Negative pronator drift    ED Treatments / Results  Labs (all labs ordered are listed, but only abnormal results are displayed) Labs Reviewed  CBC WITH DIFFERENTIAL/PLATELET - Abnormal; Notable for the following components:      Result Value   WBC 10.7 (*)    All other components within normal limits  COMPREHENSIVE METABOLIC PANEL - Abnormal; Notable for the following components:   Glucose, Bld 351 (*)    Total Protein 5.8 (*)    All other components within normal limits  URINALYSIS, ROUTINE W REFLEX MICROSCOPIC - Abnormal; Notable for the following components:   Color, Urine STRAW (*)    Glucose, UA >=500 (*)    All other components within normal limits  RAPID URINE DRUG SCREEN, HOSP PERFORMED - Abnormal; Notable for the following components:   Opiates POSITIVE (*)    All other components within normal limits  CBG MONITORING, ED - Abnormal; Notable for the following components:   Glucose-Capillary 355 (*)    All other components within normal limits  CBG MONITORING, ED - Abnormal; Notable for the following components:   Glucose-Capillary 229 (*)    All other components within normal limits  D-DIMER, QUANTITATIVE (NOT AT [facility])  I-STAT BETA HCG BLOOD, ED ([facility], [facility], [facility] ONLY)  TROPONIN I (HIGH SENSITIVITY)  TROPONIN I (HIGH SENSITIVITY)    EKG EKG Interpretation  Date/Time:  Saturday October 30 2018 16:56:53 EDT Ventricular Rate:  108 PR Interval:    QRS Duration: 82 QT Interval:  320 QTC Calculation: 429 R Axis:   77 Text Interpretation:  Sinus tachycardia ST elev, probable normal early repol pattern No significant change since last tracing Confirmed by Gwyneth Sprout ([id number]) on 10/30/2018 6:10:40 PM   Radiology Dg Thoracic Spine 2  View  Result Date: 10/30/2018 CLINICAL DATA:  18 year old female with emesis. EXAM: THORACIC SPINE 2 VIEWS COMPARISON:  Chest radiograph dated 10/30/2018 and thoracic spine radiograph dated 08/06/2018 FINDINGS: There is no acute fracture or subluxation of the thoracic spine. The vertebral body heights and disc spaces are maintained. The visualized posterior elements appear intact. The soft tissues are grossly unremarkable. IMPRESSION: No acute/traumatic thoracic spine pathology. Electronically Signed   By: Elgie Collard M.D.   On: 10/30/2018 21:44   Ct Head Wo Contrast  Result Date: 10/30/2018 CLINICAL DATA:  Trauma EXAM: CT HEAD WITHOUT CONTRAST CT CERVICAL SPINE WITHOUT CONTRAST TECHNIQUE: Multidetector CT imaging of the head and cervical spine was performed following the standard protocol without intravenous contrast. Multiplanar CT image reconstructions of the cervical spine were also generated. COMPARISON:  08/06/2018 FINDINGS: CT HEAD FINDINGS Brain: No evidence of acute infarction, hemorrhage, hydrocephalus, extra-axial collection or mass lesion/mass effect. Vascular: No hyperdense vessel or unexpected calcification. Skull: Normal. Negative for fracture or focal lesion. Sinuses/Orbits: No acute finding. Other: None. CT CERVICAL SPINE FINDINGS Alignment: Positional straightening of the normal cervical lordosis. Skull base and vertebrae: No acute fracture. No primary bone lesion or focal pathologic process. Soft tissues and spinal canal: No prevertebral fluid or swelling. No visible canal hematoma. Disc levels:  Intact. Upper chest: Negative. Other: None. IMPRESSION: 1.  No acute intracranial pathology. 2.  No fracture or static subluxation of the cervical spine. Electronically Signed   By: Lauralyn Primes M.D.   On: 10/30/2018 17:37   Ct Cervical  Spine Wo Contrast  Result Date: 10/30/2018 CLINICAL DATA:  Trauma EXAM: CT HEAD WITHOUT CONTRAST CT CERVICAL SPINE WITHOUT CONTRAST TECHNIQUE:  Multidetector CT imaging of the head and cervical spine was performed following the standard protocol without intravenous contrast. Multiplanar CT image reconstructions of the cervical spine were also generated. COMPARISON:  08/06/2018 FINDINGS: CT HEAD FINDINGS Brain: No evidence of acute infarction, hemorrhage, hydrocephalus, extra-axial collection or mass lesion/mass effect. Vascular: No hyperdense vessel or unexpected calcification. Skull: Normal. Negative for fracture or focal lesion. Sinuses/Orbits: No acute finding. Other: None. CT CERVICAL SPINE FINDINGS Alignment: Positional straightening of the normal cervical lordosis. Skull base and vertebrae: No acute fracture. No primary bone lesion or focal pathologic process. Soft tissues and spinal canal: No prevertebral fluid or swelling. No visible canal hematoma. Disc levels:  Intact. Upper chest: Negative. Other: None. IMPRESSION: 1.  No acute intracranial pathology. 2.  No fracture or static subluxation of the cervical spine. Electronically Signed   By: Lauralyn Primes M.D.   On: 10/30/2018 17:37   Dg Chest Portable 1 View  Result Date: 10/30/2018 CLINICAL DATA:  Multiple seizures, pain EXAM: PORTABLE CHEST 1 VIEW COMPARISON:  10/19/2018 FINDINGS: The heart size and mediastinal contours are within normal limits. Both lungs are clear. The visualized skeletal structures are unremarkable. IMPRESSION: No active disease. Electronically Signed   By: Elige Ko   On: 10/30/2018 17:52    Procedures Procedures (including critical care time)  Medications Ordered in ED Medications  sodium chloride 0.9 % bolus 1,000 mL (0 mLs Intravenous Stopped 10/30/18 2343)  acetaminophen (TYLENOL) tablet 650 mg (650 mg Oral Given 10/30/18 1720)  metoCLOPramide (REGLAN) injection 5 mg (5 mg Intravenous Given 10/30/18 1720)  morphine 4 MG/ML injection 4 mg (4 mg Intravenous Given 10/30/18 1843)     Initial Impression / Assessment and Plan / ED Course  I have reviewed  the triage vital signs and the nursing notes.  Pertinent labs & imaging results that were available during my care of the patient were reviewed by me and considered in my medical decision making (see chart for details).    Final Clinical Impressions(s) / ED Diagnoses   Final diagnoses:  Seizure-like activity (HCC)   18 y/o female presenting for eval of multiple complaints including seizures, nv/abd pain, CP.   On arrival, somewhat tachycardic (consistent with prior readings), otherwise reassuring VS.  Lungs CTAB. Heart with regular rhythm. Tachycardic. Reports sensory changes to the LUE/LLE but no objective findings/abnormalities on exam.  Labs revealed CBC nonacute CMP elevated BS but normal bicarb and normal anion gap Trop negative DDimer neg Beta hcg negative UA w/o evidence of UTI UDS + opiates  EKG Sinus tachycardia ST elev, probable normal early repol pattern No significant change since last tracing   CXR wnl Xray thoracic spine negative for fx or abnormality CT head/cervical spine negative for acute injury  NV/abd pain: abd soft and minimally diffusely tender. Labs reassuring. Tolerating po in the ED. She has had extensive w/u in the ED and is also following with GI. Low suspicion for surgical abdomen and do not feel she needs further w/u for this as it seems that are sxs are chronic in nature.  Chest pain: Trop negative. EKG unchanged. CXR wnl. DDimer neg. Pt with recent left heart cath with clear coronaries. She also had recent CT PE which was negative. This has been an ongoing issue for her and I have low suspicion for an emergency cause of her sxs. She can f/u  as an outpt.   Pt given 1L NS, antiemetics, on reassessment she feels improved. She no longer has any complaints. She reportedly had seizure like activity while in xray however this was felt to be secondary to pseudoseizures rather than true seizure like activity.   Seizures: Per chart review, h/o nonepileptic  seizures. EEG on 8/21 with no evidence of epileptiform seizures on overnight EEG.  She was observed in the ED for a period of almost 8 hours and did not have evidence of true seizure like activity. I feel she is appropriate for outpt f/u.   ED Discharge Orders    None       Bishop Dublin 10/31/18 0007    Blanchie Dessert, MD 10/31/18 [phone]

## 2018-10-31 NOTE — Discharge Instructions (Signed)
Please follow up with your primary care provider within 5-7 days for re-evaluation of your symptoms. If you do not have a primary care provider, information for a healthcare clinic has been provided for you to make arrangements for follow up care. Please return to the emergency department for any new or worsening symptoms. ° °

## 2018-11-01 ENCOUNTER — Emergency Department (HOSPITAL_COMMUNITY)
Admission: EM | Admit: 2018-11-01 | Discharge: 2018-11-01 | Disposition: A | Discharge status: Home / Self Care | Payer: Medicaid Other | Attending: Emergency Medicine | Admitting: Emergency Medicine

## 2018-11-01 ENCOUNTER — Emergency Department (HOSPITAL_COMMUNITY): Payer: Medicaid Other

## 2018-11-01 ENCOUNTER — Encounter (HOSPITAL_COMMUNITY): Payer: Self-pay | Admitting: Emergency Medicine

## 2018-11-01 ENCOUNTER — Other Ambulatory Visit: Payer: Self-pay

## 2018-11-01 DIAGNOSIS — R079 Chest pain, unspecified: Secondary | ICD-10-CM

## 2018-11-01 DIAGNOSIS — Z794 Long term (current) use of insulin: Secondary | ICD-10-CM | POA: Insufficient documentation

## 2018-11-01 DIAGNOSIS — I509 Heart failure, unspecified: Secondary | ICD-10-CM | POA: Diagnosis not present

## 2018-11-01 DIAGNOSIS — I10 Essential (primary) hypertension: Secondary | ICD-10-CM | POA: Diagnosis not present

## 2018-11-01 DIAGNOSIS — E109 Type 1 diabetes mellitus without complications: Secondary | ICD-10-CM | POA: Insufficient documentation

## 2018-11-01 DIAGNOSIS — R0789 Other chest pain: Secondary | ICD-10-CM | POA: Insufficient documentation

## 2018-11-01 DIAGNOSIS — Z79899 Other long term (current) drug therapy: Secondary | ICD-10-CM | POA: Diagnosis not present

## 2018-11-01 DIAGNOSIS — R112 Nausea with vomiting, unspecified: Secondary | ICD-10-CM | POA: Diagnosis not present

## 2018-11-01 DIAGNOSIS — R1084 Generalized abdominal pain: Secondary | ICD-10-CM | POA: Diagnosis not present

## 2018-11-01 LAB — COMPREHENSIVE METABOLIC PANEL
ALT: 15 U/L (ref 0–44)
AST: 15 U/L (ref 15–41)
Albumin: 3.6 g/dL (ref 3.5–5.0)
Alkaline Phosphatase: 68 U/L (ref 38–126)
Anion gap: 14 (ref 5–15)
BUN: 8 mg/dL (ref 6–20)
CO2: 22 mmol/L (ref 22–32)
Calcium: 9.6 mg/dL (ref 8.9–10.3)
Chloride: 95 mmol/L — ABNORMAL LOW (ref 98–111)
Creatinine, Ser: 0.76 mg/dL (ref 0.44–1.00)
GFR calc Af Amer: >60 mL/min (ref >60)
GFR calc non Af Amer: >60 mL/min (ref >60)
Glucose, Bld: 480 mg/dL — ABNORMAL HIGH (ref 70–99)
Potassium: 3.9 mmol/L (ref 3.5–5.1)
Sodium: 131 mmol/L — ABNORMAL LOW (ref 135–145)
Total Bilirubin: 0.3 mg/dL (ref 0.3–1.2)
Total Protein: 6.6 g/dL (ref 6.5–8.1)

## 2018-11-01 LAB — CBC WITH DIFFERENTIAL/PLATELET
Abs Immature Granulocytes: 0.03 10*3/uL (ref 0.00–0.07)
Basophils Absolute: 0 10*3/uL (ref 0.0–0.1)
Basophils Relative: 0 %
Eosinophils Absolute: 0.1 10*3/uL (ref 0.0–0.5)
Eosinophils Relative: 1 %
HCT: 39.1 % (ref 36.0–46.0)
Hemoglobin: 12.4 g/dL (ref 12.0–15.0)
Immature Granulocytes: 0 %
Lymphocytes Relative: 17 %
Lymphs Abs: 1.6 10*3/uL (ref 0.7–4.0)
MCH: 27.9 pg (ref 26.0–34.0)
MCHC: 31.7 g/dL (ref 30.0–36.0)
MCV: 87.9 fL (ref 80.0–100.0)
Monocytes Absolute: 1.1 10*3/uL — ABNORMAL HIGH (ref 0.1–1.0)
Monocytes Relative: 11 %
Neutro Abs: 6.9 10*3/uL (ref 1.7–7.7)
Neutrophils Relative %: 71 %
Platelets: 286 10*3/uL (ref 150–400)
RBC: 4.45 MIL/uL (ref 3.87–5.11)
RDW: 13.2 % (ref 11.5–15.5)
WBC: 9.7 10*3/uL (ref 4.0–10.5)
nRBC: 0 % (ref 0.0–0.2)

## 2018-11-01 LAB — BLOOD GAS, VENOUS
Acid-base deficit: 0.1 mmol/L (ref 0.0–2.0)
Bicarbonate: 25.9 mmol/L (ref 20.0–28.0)
O2 Saturation: 63.1 %
Patient temperature: 98.6
pCO2, Ven: 50.4 mmHg (ref 44.0–60.0)
pH, Ven: 7.332 (ref 7.250–7.430)
pO2, Ven: 36.4 mmHg (ref 32.0–45.0)

## 2018-11-01 LAB — D-DIMER, QUANTITATIVE: D-Dimer, Quant: 0.29 ug/mL-FEU (ref 0.00–0.50)

## 2018-11-01 LAB — LIPASE, BLOOD: Lipase: 32 U/L (ref 11–51)

## 2018-11-01 LAB — I-STAT BETA HCG BLOOD, ED (MC, WL, AP ONLY): I-stat hCG, quantitative: <5 m[IU]/mL (ref <5)

## 2018-11-01 LAB — TROPONIN I (HIGH SENSITIVITY)
Troponin I (High Sensitivity): <2 ng/L (ref <18)
Troponin I (High Sensitivity): <2 ng/L (ref <18)

## 2018-11-01 MED ORDER — ONDANSETRON HCL 4 MG/2ML IJ SOLN
4.0000 mg | Freq: Once | INTRAMUSCULAR | Status: AC
  Administered 2018-11-01: 4 mg via INTRAVENOUS
  Filled 2018-11-01: qty 2

## 2018-11-01 MED ORDER — SODIUM CHLORIDE 0.9 % IV BOLUS
500.0000 mL | Freq: Once | INTRAVENOUS | Status: AC
  Administered 2018-11-01: 500 mL via INTRAVENOUS

## 2018-11-01 MED ORDER — IOHEXOL 350 MG/ML SOLN
100.0000 mL | Freq: Once | INTRAVENOUS | Status: AC | PRN
  Administered 2018-11-01: 09:00:00 100 mL via INTRAVENOUS

## 2018-11-01 MED ORDER — DICYCLOMINE HCL 10 MG PO CAPS
10.0000 mg | ORAL_CAPSULE | Freq: Once | ORAL | Status: AC
  Administered 2018-11-01: 10 mg via ORAL
  Filled 2018-11-01: qty 1

## 2018-11-01 MED ORDER — SODIUM CHLORIDE (PF) 0.9 % IJ SOLN
INTRAMUSCULAR | Status: AC
  Filled 2018-11-01: qty 50

## 2018-11-01 MED ORDER — ACETAMINOPHEN 500 MG PO TABS
1000.0000 mg | ORAL_TABLET | Freq: Once | ORAL | Status: AC
  Administered 2018-11-01: 1000 mg via ORAL
  Filled 2018-11-01: qty 2

## 2018-11-01 MED ORDER — SODIUM CHLORIDE 0.9 % IV BOLUS: 1000.0000 mL | Freq: Once | INTRAVENOUS | Status: DC

## 2018-11-01 MED ORDER — ASPIRIN 81 MG PO CHEW
324.0000 mg | CHEWABLE_TABLET | Freq: Once | ORAL | Status: AC
  Administered 2018-11-01: 324 mg via ORAL
  Filled 2018-11-01: qty 4

## 2018-11-01 NOTE — ED Notes (Signed)
Pt PO challenged 

## 2018-11-01 NOTE — Discharge Instructions (Addendum)
Your CT today was within normal limits.  Please follow-up with your primary care physician at your earliest convenience.  If you experience any chest pain, shortness of breath, worsening symptoms you may return to the emergency department.  Please continue compliance with your medication for your blood pressure, diabetes.

## 2018-11-01 NOTE — ED Notes (Signed)
Pt provided a warm blanket as requested.

## 2018-11-01 NOTE — ED Provider Notes (Addendum)
Nancy [name] Provider Note   CSN: [id number] Arrival date & time: 11/01/18  0520     History   Chief Complaint Chief Complaint  Patient presents with  . Abdominal Pain    HPI Nancy [name] is a 18 y.o. female with a hx of anxiety/depression, CHF, diabetes, questionable seizure versus conversion disorder presents to the Emergency Department complaining of gradual, persistent, progressively worsening abdominal pain onset 3 days ago.  Associated symptoms include nausea, vomiting, diarrhea and diffuse abdominal pain.  She reports hematemesis.  This is a chronic issue and she follows with GI for it however she reports it is worse in the last few days.  No fever or chills, no melena or hematochezia.  No treatments prior to arrival.  LMP: 10/20/2018.    Additionally, patient complains of left-sided chest pain that radiates to her left shoulder and back.  Patient reports constant pain x2 days.  She denies cough, shortness of breath, pleuritic pain.  She denies leg swelling or pain.  She denies previous history of DVT/PE.  She does have a history of congestive heart failure but denies peripheral edema.  She does not take birth control and denies hemoptysis.  Pt also c/o increasing depression worse in the last 3 days.  She reports several days ago she felt suicidal but she does not today.  No hx of self harm.  Pt reports her health issues have made her depressed.  Pt reports some of her concern is her living arrangement.  Pt reports previous hospitalization for depression and SI.  Pt denies HI/AVH.  Pt denies etoh and drug usage.     The history is provided by the patient and medical records. No language interpreter was used.    Past Medical History:  Diagnosis Date  . Acanthosis nigricans   . Anxiety   . CHF (congestive heart failure) (HCC)   . Chronic lower back pain   . Depression   . DKA, type 1 (HCC) 09/13/2018  . Dyspepsia   . Obesity   . Ovarian  cyst    pt is not aware of this hx (11/24/2017)  . Pre-diabetes   . 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 Premature baby   . Seizures (HCC)   . Type II diabetes mellitus (HCC)    insulin dependant    Patient Active Problem List   Diagnosis Date Noted  . Nonspecific abnormal electrocardiogram (ECG) (EKG)   . Chest pain of uncertain etiology   . DKA, type 1 (HCC) 10/08/2018  . Hypertensive urgency 10/08/2018  . Conversion disorder with attacks or seizures, acute episode, with psychological stressor 09/16/2018  . Suicidal ideations 09/13/2018  . MDD (major depressive disorder), recurrent severe, without psychosis (HCC)   . Syncope 01/30/2018  . Orthostatic hypotension 01/24/2018  . Chronic abdominal pain 12/24/2017  . Chest pain 12/19/2017  . Nausea and vomiting 08/21/2017  . Generalized abdominal pain 08/21/2017  . Non compliance with medical treatment 01/27/2012  . Adjustment disorder 09/16/2011  . DM (diabetes mellitus), type 1, uncontrolled (HCC) 09/15/2011  . Acanthosis nigricans   . Goiter   . Obesity 06/14/2010  . Hypertension 06/14/2010    Past Surgical History:  Procedure Laterality Date  . ABDOMINAL HERNIA REPAIR     "I was a baby"  . BIOPSY  10/12/2018   Procedure: BIOPSY;  Surgeon: Lynann Bologna, MD;  Location: [facility] ENDOSCOPY;  Service: Endoscopy;;  . ESOPHAGOGASTRODUODENOSCOPY (EGD) WITH PROPOFOL N/A 10/12/2018   Procedure: ESOPHAGOGASTRODUODENOSCOPY (EGD) WITH  PROPOFOL;  Surgeon: Lynann Bologna, MD;  Location: [facility] ENDOSCOPY;  Service: Endoscopy;  Laterality: N/A;  . HERNIA REPAIR    . LEFT HEART CATH AND CORONARY ANGIOGRAPHY N/A 10/13/2018   Procedure: LEFT HEART CATH AND CORONARY ANGIOGRAPHY;  Surgeon: Kathleene Hazel, MD;  Location: [facility] INVASIVE CV LAB;  Service: Cardiovascular;  Laterality: N/A;  . TONSILLECTOMY AND ADENOIDECTOMY    . WISDOM TOOTH EXTRACTION  2017     OB History   No obstetric history on file.      Home Medications    Prior to  Admission medications   Medication Sig Start Date End Date Taking? Authorizing Provider  amLODipine (NORVASC) 5 MG tablet Take 1 tablet (5 mg total) by mouth daily. 10/16/18 11/15/18  Jerald Kief, MD  ARIPiprazole (ABILIFY) 20 MG tablet Take 20 mg by mouth daily.    [provider]  atorvastatin (LIPITOR) 20 MG tablet Take 1 tablet (20 mg total) by mouth daily at 6 PM. 10/05/18   Storm Frisk, MD  FLUoxetine (PROZAC) 40 MG capsule Take 1 capsule (40 mg total) by mouth at bedtime. 10/05/18   Storm Frisk, MD  hydrOXYzine (ATARAX/VISTARIL) 25 MG tablet Take 1 tablet (25 mg total) by mouth 3 (three) times daily as needed for anxiety. 10/05/18   Storm Frisk, MD  Insulin Degludec (TRESIBA) 100 UNIT/ML SOLN Inject 52 Units into the skin daily. 10/05/18   Storm Frisk, MD  insulin lispro (HUMALOG) 100 UNIT/ML injection Inject 0.15 mLs (15 Units total) into the skin 2 (two) times daily with a meal. 10/05/18   Storm Frisk, MD  lamoTRIgine (LAMICTAL) 25 MG tablet Take 1 tablet (25 mg total) by mouth 2 (two) times daily. 10/05/18   Storm Frisk, MD  lubiprostone (AMITIZA) 24 MCG capsule Take 1 capsule (24 mcg total) by mouth 2 (two) times daily with a meal. 10/27/18   Esterwood, Amy S, PA-C  metoCLOPramide (REGLAN) 10 MG tablet Take 1 tablet (10 mg total) by mouth 4 (four) times daily -  before meals and at bedtime. 10/15/18 11/14/18  Jerald Kief, MD  Multiple Vitamin (MULTIVITAMIN WITH MINERALS) TABS tablet Take 1 tablet by mouth daily. 09/18/18   Clapacs, Jackquline Denmark, MD  pantoprazole (PROTONIX) 40 MG tablet Take 1 tablet (40 mg total) by mouth daily. 10/16/18 11/15/18  Jerald Kief, MD  promethazine (PHENERGAN) 25 MG suppository Place 1 suppository (25 mg total) rectally every 6 (six) hours as needed for nausea or vomiting. 10/16/18   Liberty Handy, PA-C  propranolol (INDERAL) 10 MG tablet Take 1 tablet (10 mg total) by mouth 3 (three) times daily. 10/15/18 11/14/18   Jerald Kief, MD  topiramate (TOPAMAX) 25 MG tablet Take 1 tablet (25 mg total) by mouth 2 (two) times daily. 10/05/18   Storm Frisk, MD  traZODone (DESYREL) 100 MG tablet Take 3 tablets (300 mg total) by mouth at bedtime. 10/05/18   Hoy Register, MD    Family History Family History  Problem Relation Age of Onset  . Diabetes Mother   . Hypertension Mother   . Obesity Mother   . Asthma Mother   . Allergic rhinitis Mother   . Eczema Mother   . Cervical cancer Mother   . Diabetes Father   . Hypertension Father   . Obesity Father   . Hyperlipidemia Father   . Hypertension Paternal Aunt   . Hypertension Maternal Grandfather   . Colon cancer Maternal Grandfather   .  Diabetes Paternal Grandmother   . Obesity Paternal Grandmother   . Diabetes Paternal Grandfather   . Obesity Paternal Grandfather   . Angioedema Neg Hx   . Immunodeficiency Neg Hx   . Urticaria Neg Hx   . Stomach cancer Neg Hx   . Esophageal cancer Neg Hx     Social History Social History   Tobacco Use  . Smoking status: Never Smoker  . Smokeless tobacco: Never Used  Substance Use Topics  . Alcohol use: No    Alcohol/week: 0.0 standard drinks  . Drug use: No     Allergies   Ibuprofen   Review of Systems Review of Systems  Constitutional: Negative for appetite change, diaphoresis, fatigue, fever and unexpected weight change.  HENT: Negative for mouth sores.   Eyes: Negative for visual disturbance.  Respiratory: Negative for cough, chest tightness, shortness of breath and wheezing.   Cardiovascular: Positive for chest pain.  Gastrointestinal: Positive for abdominal pain, diarrhea, nausea and vomiting. Negative for constipation.  Endocrine: Negative for polydipsia, polyphagia and polyuria.  Genitourinary: Negative for dysuria, frequency, hematuria and urgency.  Musculoskeletal: Negative for back pain and neck stiffness.  Skin: Negative for rash.  Allergic/Immunologic: Negative for  immunocompromised state.  Neurological: Negative for syncope, light-headedness and headaches.  Hematological: Does not bruise/bleed easily.  Psychiatric/Behavioral: Positive for suicidal ideas. Negative for sleep disturbance. The patient is not nervous/anxious.      Physical Exam Updated Vital Signs BP (!) 129/92 (BP Location: Left Arm)   Pulse (!) 108   Temp 98.9 F (37.2 C) (Oral)   Resp 19   Ht 5\' 3"  (1.6 m)   Wt 86.6 kg   LMP 10/19/2018   SpO2 100%   BMI 33.83 kg/m   Physical Exam Vitals signs and nursing note reviewed.  Constitutional:      General: She is not in acute distress.    Appearance: She is not diaphoretic.  HENT:     Head: Normocephalic.  Eyes:     General: No scleral icterus.    Conjunctiva/sclera: Conjunctivae normal.  Neck:     Musculoskeletal: Normal range of motion.  Cardiovascular:     Rate and Rhythm: Regular rhythm. Tachycardia present.     Pulses: Normal pulses.          Radial pulses are 2+ on the right side and 2+ on the left side.  Pulmonary:     Effort: No tachypnea, accessory muscle usage, prolonged expiration, respiratory distress or retractions.     Breath sounds: No stridor.     Comments: Equal chest rise. No increased work of breathing. Abdominal:     General: There is no distension.     Palpations: Abdomen is soft.     Tenderness: There is generalized abdominal tenderness. There is no guarding or rebound.  Musculoskeletal:     Right lower leg: No edema.     Left lower leg: No edema.     Comments: Moves all extremities equally and without difficulty. No calf tenderness  Skin:    General: Skin is warm and dry.     Capillary Refill: Capillary refill takes less than 2 seconds.  Neurological:     Mental Status: She is alert.     GCS: GCS eye subscore is 4. GCS verbal subscore is 5. GCS motor subscore is 6.     Comments: Speech is clear and goal oriented.  Psychiatric:        Mood and Affect: Mood normal.  ED Treatments  / Results  Labs (all labs ordered are listed, but only abnormal results are displayed) Labs Reviewed  CBC WITH DIFFERENTIAL/PLATELET - Abnormal; Notable for the following components:      Result Value   Monocytes Absolute 1.1 (*)    All other components within normal limits  D-DIMER, QUANTITATIVE (NOT AT [facility])  COMPREHENSIVE METABOLIC PANEL  LIPASE, BLOOD  URINALYSIS, ROUTINE W REFLEX MICROSCOPIC  BLOOD GAS, VENOUS  I-STAT BETA HCG BLOOD, ED ([facility], [facility], [facility] ONLY)  TROPONIN I (HIGH SENSITIVITY)    EKG EKG Interpretation  Date/Time:  Monday November 01 2018 06:28:45 EDT Ventricular Rate:  98 PR Interval:    QRS Duration: 82 QT Interval:  337 QTC Calculation: 431 R Axis:   80 Text Interpretation:  Sinus rhythm LVH by voltage ST elev, probable normal early repol pattern similar to 10/30/18, increased J point elevation V6 Confirmed by Glynn Octaveancour, Stephen [phone]([id number]) on 11/01/2018 6:38:36 AM   EKG Interpretation  Date/Time:  Monday November 01 2018 07:01:04 EDT Ventricular Rate:  111 PR Interval:    QRS Duration: 83 QT Interval:  330 QTC Calculation: 449 R Axis:   66 Text Interpretation:  Sinus tachycardia ST elev, probable normal early repol pattern Confirmed by Glynn Octaveancour, Stephen [phone]([id number]) on 11/01/2018 7:04:11 AM       Procedures Procedures (including critical care time)  Medications Ordered in ED Medications  aspirin chewable tablet 324 mg (has no administration in time range)  ondansetron (ZOFRAN) injection 4 mg (has no administration in time range)  acetaminophen (TYLENOL) tablet 1,000 mg (has no administration in time range)  dicyclomine (BENTYL) capsule 10 mg (has no administration in time range)  sodium chloride 0.9 % bolus 500 mL (500 mLs Intravenous New Bag/Given 11/01/18 [id number])     Initial Impression / Assessment and Plan / ED Course  I have reviewed the triage vital signs and the nursing notes.  Pertinent labs & imaging results that were available during my care of the  patient were reviewed by me and considered in my medical decision making (see chart for details).        Pt presents for evaluation of continued and persistent chest pain, abdominal pain, and bloody emesis.  She was recently in the hospital and discharged on 10/15/2018 and has been seen in the ED for similar symptoms on 10/10, 10/13, 10/18, 10/24.    Of note: Cardiac cath 10/07 - no obstructive disease EGD 10/06 - mild gastritis, mild candidal esophagitis, recommend continuation of Protonix.   ECG: with slight elevation in V6 from 10/24.  Will repeat.  The patient was discussed with and seen by Dr. Manus Gunningancour who agrees with the treatment plan.  Labs and imaging pending.   6:59 AM At shift change [facility] will follow pending studies, re-evaulate and determine disposition.      Final Clinical Impressions(s) / ED Diagnoses   Final diagnoses:  Generalized abdominal pain  Non-intractable vomiting with nausea, unspecified vomiting type  Left-sided chest pain    ED Discharge Orders    None         Milta DeitersMuthersbaugh, Lorre Opdahl, PA-C 11/01/18 [id number]    Glynn Octaveancour, Stephen, MD 11/01/18 [phone]

## 2018-11-01 NOTE — ED Notes (Signed)
Pt has a Lac to top of her head, mild bleeding is noted and is controlled. Pt reports that she had probably sustained the injury this morning during a seizure.

## 2018-11-01 NOTE — ED Triage Notes (Signed)
Pt presents by [facility] for evaluation of abdominal pain with emesis and diarrhea since yesterday. Pt also reports depression for the last several days without any thoughts of SI/HI.

## 2018-11-01 NOTE — ED Provider Notes (Signed)
Physical Exam  BP 97/80   Pulse (!) 101   Temp 98.9 F (37.2 C) (Oral)   Resp 17   Ht 5\' 3"  (1.6 m)   Wt 86.6 kg   LMP 10/19/2018 Comment: NEG BETA HCG 11/01/18  SpO2 97%   BMI 33.83 kg/m   Physical Exam  ED Course/Procedures   Clinical Course as of Nov 01 1118  Mon Nov 01, 2018  0832 D-Dimer, Quant: 0.29 [JS]  0832 Discussed with cardiologist Dr. Debara Pickett   Troponin I (High Sensitivity): <2 [JS]    Clinical Course User Index [JS] Janeece Fitting, PA-C    Procedures  MDM  Patient care assumed from [facility]. PA-C at shift change, please see her note for a full HPI. Briefly, patient here with prior history of CHF, anxiety, depression presents with significant left-sided chest pain with radiation onto her back.  Patient has had a previous cardiac work-up and last admission was October 5 in which she had an EGD which showed esophagitis along with a cardiac cath: 1. The LAD is a large caliber vessel that courses to the apex and becomes small caliber near the apex. 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 Good flow to the apex. No obvious dissection flap or contrast dye staining.  2. The Circumflex is large and has no obstructive disease. 3. The RCA is large and has no obstructive disease.   CMP with some mild hyponatremia, glucose is 480, she does have a previous history of diabetes.  Kidney function is unremarkable.  No anion gap on today's visit.  CBC without any leukocytosis, hemoglobin is within normal limits.  VBG within normal limits.  No signs of DKA on today's visit.  Patient did receive fluids by prior provider, she does have a history of CHF will monitor this closely.  Serial troponins have been unremarkable.  hCG is negative.  Lipase level is within normal limits.  D-dimer is negative.  EKG has been repeated and will consult cardiology to further evaluate.  I have personally review x-ray of her chest and acute abdomen without any acute process at this  time.  8:29 AM Spoke to Dr. Debara Pickett, cardiology who evaluated EKG and does not believe this is cardiac involvement with reassuring ECHO and cardiac cath recently. She does not have any changes likely early repolarization, Plan for serial troponin.Could be diagnosed with amlodipine that could help with a possible spasms. Will add UDS.   CT angio dissection study showed: No evidence of thoracic or abdominal aortic dissection or aneurysm.    No significant abnormality seen in the chest, abdomen or pelvis.     10:12 AM Patient was reassessed by me, she reports no symptoms at this time.  Pain in her chest has resolved but she has been sleeping in room.  Vitals are within normal limits aside from some mild elevation in her heart rate at 100.  Patient denies any SI, HI, or hallucinations at this time.  She does report established primary care follow-up.  I have discussed the results of all her testing.  Will p.o. challenge patient prior to disposition.  UDS is currently pending, she does deny any history of drug use and alcohol use.  11:17 AM patient is currently tolerating p.o., vital signs are within normal limits.  EKG as reiterated within normal limits today.  D-dimer is negative.  This has been an ongoing issue for patient but with a reassuring cardiac cath with clear coronaries feel that this is  likely a chronic symptom at this time. She does have a previous history of seizures, EEG last performed on August 21 without any evidence of epileptiform.  Patient is agreeable of discharge home, according to her she does have proper primary care follow-up.  With stable vital signs, tolerating p.o. and no further symptoms patient stable for discharge.   Portions of this note were generated with Scientist, clinical (histocompatibility and immunogenetics). Dictation errors may occur despite best attempts at proofreading.     Claude Manges, PA-C 11/01/18 1120    Sabas Sous, MD 11/02/18 [phone]

## 2018-11-03 ENCOUNTER — Encounter (HOSPITAL_COMMUNITY): Payer: Self-pay | Admitting: Emergency Medicine

## 2018-11-03 ENCOUNTER — Emergency Department (HOSPITAL_COMMUNITY)
Admission: EM | Admit: 2018-11-03 | Discharge: 2018-11-06 | Disposition: A | Discharge status: Other Acute Inpatient Hospital | Payer: Medicaid Other | Attending: Emergency Medicine | Admitting: Emergency Medicine

## 2018-11-03 DIAGNOSIS — I509 Heart failure, unspecified: Secondary | ICD-10-CM | POA: Insufficient documentation

## 2018-11-03 DIAGNOSIS — E1065 Type 1 diabetes mellitus with hyperglycemia: Secondary | ICD-10-CM | POA: Diagnosis not present

## 2018-11-03 DIAGNOSIS — Z79899 Other long term (current) drug therapy: Secondary | ICD-10-CM | POA: Insufficient documentation

## 2018-11-03 DIAGNOSIS — I110 Hypertensive heart disease with heart failure: Secondary | ICD-10-CM | POA: Insufficient documentation

## 2018-11-03 DIAGNOSIS — Z886 Allergy status to analgesic agent status: Secondary | ICD-10-CM | POA: Insufficient documentation

## 2018-11-03 DIAGNOSIS — R739 Hyperglycemia, unspecified: Secondary | ICD-10-CM

## 2018-11-03 DIAGNOSIS — Z794 Long term (current) use of insulin: Secondary | ICD-10-CM | POA: Insufficient documentation

## 2018-11-03 DIAGNOSIS — Z20828 Contact with and (suspected) exposure to other viral communicable diseases: Secondary | ICD-10-CM | POA: Diagnosis not present

## 2018-11-03 DIAGNOSIS — F332 Major depressive disorder, recurrent severe without psychotic features: Secondary | ICD-10-CM | POA: Insufficient documentation

## 2018-11-03 DIAGNOSIS — F329 Major depressive disorder, single episode, unspecified: Secondary | ICD-10-CM | POA: Diagnosis present

## 2018-11-03 DIAGNOSIS — R45851 Suicidal ideations: Secondary | ICD-10-CM | POA: Insufficient documentation

## 2018-11-03 LAB — COMPREHENSIVE METABOLIC PANEL
ALT: 16 U/L (ref 0–44)
AST: 14 U/L — ABNORMAL LOW (ref 15–41)
Albumin: 3.8 g/dL (ref 3.5–5.0)
Alkaline Phosphatase: 80 U/L (ref 38–126)
Anion gap: 14 (ref 5–15)
BUN: 6 mg/dL (ref 6–20)
CO2: 23 mmol/L (ref 22–32)
Calcium: 9.7 mg/dL (ref 8.9–10.3)
Chloride: 99 mmol/L (ref 98–111)
Creatinine, Ser: 0.74 mg/dL (ref 0.44–1.00)
GFR calc Af Amer: >60 mL/min (ref >60)
GFR calc non Af Amer: >60 mL/min (ref >60)
Glucose, Bld: 463 mg/dL — ABNORMAL HIGH (ref 70–99)
Potassium: 4.4 mmol/L (ref 3.5–5.1)
Sodium: 136 mmol/L (ref 135–145)
Total Bilirubin: 0.2 mg/dL — ABNORMAL LOW (ref 0.3–1.2)
Total Protein: 6.7 g/dL (ref 6.5–8.1)

## 2018-11-03 LAB — RAPID URINE DRUG SCREEN, HOSP PERFORMED
Amphetamines: NOT DETECTED
Barbiturates: NOT DETECTED
Benzodiazepines: NOT DETECTED
Cocaine: NOT DETECTED
Opiates: NOT DETECTED
Tetrahydrocannabinol: NOT DETECTED

## 2018-11-03 LAB — URINALYSIS, ROUTINE W REFLEX MICROSCOPIC
Bacteria, UA: NONE SEEN
Bilirubin Urine: NEGATIVE
Glucose, UA: >=500 mg/dL — AB
Hgb urine dipstick: NEGATIVE
Ketones, ur: NEGATIVE mg/dL
Leukocytes,Ua: NEGATIVE
Nitrite: NEGATIVE
Protein, ur: NEGATIVE mg/dL
Specific Gravity, Urine: 1.029 (ref 1.005–1.030)
pH: 6 (ref 5.0–8.0)

## 2018-11-03 LAB — CBC
HCT: 37.1 % (ref 36.0–46.0)
Hemoglobin: 12.1 g/dL (ref 12.0–15.0)
MCH: 27.9 pg (ref 26.0–34.0)
MCHC: 32.6 g/dL (ref 30.0–36.0)
MCV: 85.7 fL (ref 80.0–100.0)
Platelets: 277 10*3/uL (ref 150–400)
RBC: 4.33 MIL/uL (ref 3.87–5.11)
RDW: 12.9 % (ref 11.5–15.5)
WBC: 9.4 10*3/uL (ref 4.0–10.5)
nRBC: 0 % (ref 0.0–0.2)

## 2018-11-03 LAB — SARS CORONAVIRUS 2 BY RT PCR (HOSPITAL ORDER, PERFORMED IN ~~LOC~~ HOSPITAL LAB): SARS Coronavirus 2: NEGATIVE

## 2018-11-03 LAB — I-STAT BETA HCG BLOOD, ED (MC, WL, AP ONLY): I-stat hCG, quantitative: <5 m[IU]/mL (ref <5)

## 2018-11-03 LAB — ETHANOL: Alcohol, Ethyl (B): <10 mg/dL (ref <10)

## 2018-11-03 LAB — CBG MONITORING, ED
Glucose-Capillary: 450 mg/dL — ABNORMAL HIGH (ref 70–99)
Glucose-Capillary: 387 mg/dL — ABNORMAL HIGH (ref 70–99)
Glucose-Capillary: 359 mg/dL — ABNORMAL HIGH (ref 70–99)
Glucose-Capillary: 353 mg/dL — ABNORMAL HIGH (ref 70–99)
Glucose-Capillary: 276 mg/dL — ABNORMAL HIGH (ref 70–99)
Glucose-Capillary: 205 mg/dL — ABNORMAL HIGH (ref 70–99)
Glucose-Capillary: 176 mg/dL — ABNORMAL HIGH (ref 70–99)

## 2018-11-03 LAB — SALICYLATE LEVEL: Salicylate Lvl: <7 mg/dL (ref 2.8–30.0)

## 2018-11-03 LAB — ACETAMINOPHEN LEVEL: Acetaminophen (Tylenol), Serum: <10 ug/mL — ABNORMAL LOW (ref 10–30)

## 2018-11-03 MED ORDER — LUBIPROSTONE 24 MCG PO CAPS
24.0000 ug | ORAL_CAPSULE | Freq: Two times a day (BID) | ORAL | Status: DC | PRN
  Filled 2018-11-03: qty 1

## 2018-11-03 MED ORDER — AMLODIPINE BESYLATE 5 MG PO TABS
5.0000 mg | ORAL_TABLET | Freq: Every day | ORAL | Status: DC
  Administered 2018-11-03 – 2018-11-06 (×3): 5 mg via ORAL
  Filled 2018-11-03 (×5): qty 1

## 2018-11-03 MED ORDER — INSULIN GLARGINE 100 UNIT/ML ~~LOC~~ SOLN
42.0000 [IU] | Freq: Every day | SUBCUTANEOUS | Status: DC
  Administered 2018-11-04 (×2): 42 [IU] via SUBCUTANEOUS
  Filled 2018-11-03 (×4): qty 0.42

## 2018-11-03 MED ORDER — LAMOTRIGINE 25 MG PO TABS
25.0000 mg | ORAL_TABLET | Freq: Two times a day (BID) | ORAL | Status: DC
  Administered 2018-11-03 – 2018-11-06 (×7): 25 mg via ORAL
  Filled 2018-11-03 (×10): qty 1

## 2018-11-03 MED ORDER — SODIUM CHLORIDE 0.9 % IV BOLUS
500.0000 mL | Freq: Once | INTRAVENOUS | Status: AC
  Administered 2018-11-03: 500 mL via INTRAVENOUS

## 2018-11-03 MED ORDER — TRAZODONE HCL 100 MG PO TABS
300.0000 mg | ORAL_TABLET | Freq: Every day | ORAL | Status: DC
  Administered 2018-11-03 – 2018-11-05 (×2): 300 mg via ORAL
  Filled 2018-11-03: qty 3
  Filled 2018-11-03: qty 6
  Filled 2018-11-03: qty 3

## 2018-11-03 MED ORDER — SODIUM CHLORIDE 0.9 % IV BOLUS
500.0000 mL | Freq: Once | INTRAVENOUS | Status: AC
  Administered 2018-11-03: 20:00:00 500 mL via INTRAVENOUS

## 2018-11-03 MED ORDER — TOPIRAMATE 25 MG PO TABS
25.0000 mg | ORAL_TABLET | Freq: Two times a day (BID) | ORAL | Status: DC
  Administered 2018-11-03 – 2018-11-06 (×7): 25 mg via ORAL
  Filled 2018-11-03 (×7): qty 1

## 2018-11-03 MED ORDER — PANTOPRAZOLE SODIUM 40 MG PO TBEC
40.0000 mg | DELAYED_RELEASE_TABLET | Freq: Every day | ORAL | Status: DC
  Administered 2018-11-03 – 2018-11-06 (×4): 40 mg via ORAL
  Filled 2018-11-03 (×4): qty 1

## 2018-11-03 MED ORDER — INSULIN ASPART 100 UNIT/ML ~~LOC~~ SOLN
15.0000 [IU] | Freq: Once | SUBCUTANEOUS | Status: AC
  Administered 2018-11-03: 11:00:00 15 [IU] via SUBCUTANEOUS

## 2018-11-03 MED ORDER — ONDANSETRON HCL 4 MG/2ML IJ SOLN
4.0000 mg | Freq: Once | INTRAMUSCULAR | Status: AC
  Administered 2018-11-03: 4 mg via INTRAVENOUS
  Filled 2018-11-03: qty 2

## 2018-11-03 MED ORDER — ACETAMINOPHEN 500 MG PO TABS
1000.0000 mg | ORAL_TABLET | Freq: Once | ORAL | Status: AC
  Administered 2018-11-03: 11:00:00 1000 mg via ORAL
  Filled 2018-11-03: qty 2

## 2018-11-03 MED ORDER — INSULIN DEGLUDEC 100 UNIT/ML ~~LOC~~ SOLN: 52.0000 [IU] | Freq: Every day | SUBCUTANEOUS | Status: DC

## 2018-11-03 MED ORDER — PROPRANOLOL HCL 10 MG PO TABS
10.0000 mg | ORAL_TABLET | Freq: Three times a day (TID) | ORAL | Status: DC
  Administered 2018-11-03 – 2018-11-06 (×7): 10 mg via ORAL
  Filled 2018-11-03 (×13): qty 1

## 2018-11-03 MED ORDER — INSULIN ASPART 100 UNIT/ML ~~LOC~~ SOLN
15.0000 [IU] | Freq: Two times a day (BID) | SUBCUTANEOUS | Status: DC
  Administered 2018-11-03 – 2018-11-05 (×4): 15 [IU] via SUBCUTANEOUS

## 2018-11-03 MED ORDER — ARIPIPRAZOLE 10 MG PO TABS
20.0000 mg | ORAL_TABLET | Freq: Every day | ORAL | Status: DC
  Administered 2018-11-03 – 2018-11-06 (×4): 20 mg via ORAL
  Filled 2018-11-03 (×7): qty 2

## 2018-11-03 MED ORDER — FLUOXETINE HCL 20 MG PO CAPS
40.0000 mg | ORAL_CAPSULE | Freq: Every day | ORAL | Status: DC
  Administered 2018-11-03 – 2018-11-05 (×3): 40 mg via ORAL
  Filled 2018-11-03 (×4): qty 2

## 2018-11-03 MED ORDER — ATORVASTATIN CALCIUM 10 MG PO TABS
20.0000 mg | ORAL_TABLET | Freq: Every day | ORAL | Status: DC
  Administered 2018-11-03 – 2018-11-05 (×3): 20 mg via ORAL
  Filled 2018-11-03 (×4): qty 2

## 2018-11-03 MED ORDER — HYDROXYZINE HCL 25 MG PO TABS
25.0000 mg | ORAL_TABLET | Freq: Once | ORAL | Status: AC
  Administered 2018-11-03: 25 mg via ORAL
  Filled 2018-11-03: qty 1

## 2018-11-03 MED ORDER — INSULIN ASPART 100 UNIT/ML ~~LOC~~ SOLN
10.0000 [IU] | Freq: Once | SUBCUTANEOUS | Status: AC
  Administered 2018-11-03: 16:00:00 10 [IU] via SUBCUTANEOUS

## 2018-11-03 MED ORDER — ADULT MULTIVITAMIN W/MINERALS CH
1.0000 | ORAL_TABLET | Freq: Every day | ORAL | Status: DC
  Administered 2018-11-03 – 2018-11-06 (×4): 1 via ORAL
  Filled 2018-11-03 (×4): qty 1

## 2018-11-03 MED ORDER — KETOROLAC TROMETHAMINE 15 MG/ML IJ SOLN
15.0000 mg | Freq: Once | INTRAMUSCULAR | Status: AC
  Administered 2018-11-03: 15 mg via INTRAVENOUS
  Filled 2018-11-03: qty 1

## 2018-11-03 NOTE — ED Provider Notes (Signed)
[facility] EMERGENCY [facility] Provider Note   CSN: [id number] Arrival date & time: 11/03/18  0342     History   Chief Complaint Chief Complaint  Patient presents with  . Hyperglycemia  . Suicidal    HPI Nancy [name] is a 18 y.o. female.     HPI   18 year old female presents today with complaints of suicidal ideation.  Patient notes that she has had depression that has been ongoing.  She ran out of her antidepressant medication approximately 2 weeks ago.  She notes that she went to [facility] but was told that she did not need to stay, she notes her depression is worsened into suicidal ideations.  When asked what thought she had about harming herself she would like to drink rubbing alcohol or take a bunch of pills.  She denies any homicidal ideation hallucinations.  She notes she is a type I diabetic and takes her insulin as directed.  She notes her blood sugar at home is usually in the 100s but whenever she comes to the emergency room is significantly elevated.  She notes body aches and cramps today diffusely.    Past Medical History:  Diagnosis Date  . Acanthosis nigricans   . Anxiety   . CHF (congestive heart failure) (HCC)   . Chronic lower back pain   . Depression   . DKA, type 1 (HCC) 09/13/2018  . Dyspepsia   . Obesity   . Ovarian cyst    pt is not aware of this hx (11/24/2017)  . Pre-diabetes   . 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 Premature baby   . Seizures (HCC)   . Type II diabetes mellitus (HCC)    insulin dependant    Patient Active Problem List   Diagnosis Date Noted  . Nonspecific abnormal electrocardiogram (ECG) (EKG)   . Chest pain of uncertain etiology   . DKA, type 1 (HCC) 10/08/2018  . Hypertensive urgency 10/08/2018  . Conversion disorder with attacks or seizures, acute episode, with psychological stressor 09/16/2018  . Suicidal ideations 09/13/2018  . MDD (major depressive disorder), recurrent severe, without psychosis  (HCC)   . Syncope 01/30/2018  . Orthostatic hypotension 01/24/2018  . Chronic abdominal pain 12/24/2017  . Chest pain 12/19/2017  . Nausea and vomiting 08/21/2017  . Generalized abdominal pain 08/21/2017  . Non compliance with medical treatment 01/27/2012  . Adjustment disorder 09/16/2011  . DM (diabetes mellitus), type 1, uncontrolled (HCC) 09/15/2011  . Acanthosis nigricans   . Goiter   . Obesity 06/14/2010  . Hypertension 06/14/2010    Past Surgical History:  Procedure Laterality Date  . ABDOMINAL HERNIA REPAIR     "I was a baby"  . BIOPSY  10/12/2018   Procedure: BIOPSY;  Surgeon: Lynann BolognaGupta, Rajesh, MD;  [facility];  Service: Endoscopy;;  . ESOPHAGOGASTRODUODENOSCOPY (EGD) WITH PROPOFOL N/A 10/12/2018   Procedure: ESOPHAGOGASTRODUODENOSCOPY (EGD) WITH PROPOFOL;  Surgeon: Lynann BolognaGupta, Rajesh, MD;  [facility];  Service: Endoscopy;  Laterality: N/A;  . HERNIA REPAIR    . LEFT HEART CATH AND CORONARY ANGIOGRAPHY N/A 10/13/2018   Procedure: LEFT HEART CATH AND CORONARY ANGIOGRAPHY;  Surgeon: Kathleene HazelMcAlhany, Christopher D, MD;  Location: [facility] INVASIVE CV LAB;  Service: Cardiovascular;  Laterality: N/A;  . TONSILLECTOMY AND ADENOIDECTOMY    . WISDOM TOOTH EXTRACTION  2017     OB History   No obstetric history on file.      Home Medications    Prior to Admission medications  Medication Sig Start Date End Date Taking? Authorizing Provider  amLODipine (NORVASC) 5 MG tablet Take 1 tablet (5 mg total) by mouth daily. 10/16/18 11/15/18 Yes Jerald Kief, MD  ARIPiprazole (ABILIFY) 20 MG tablet Take 20 mg by mouth daily.   Yes [provider]  atorvastatin (LIPITOR) 20 MG tablet Take 1 tablet (20 mg total) by mouth daily at 6 PM. 10/05/18  Yes Storm Frisk, MD  FLUoxetine (PROZAC) 40 MG capsule Take 1 capsule (40 mg total) by mouth at bedtime. 10/05/18  Yes Storm Frisk, MD  hydrOXYzine (ATARAX/VISTARIL) 25 MG tablet Take 1 tablet (25 mg total) by mouth 3  (three) times daily as needed for anxiety. 10/05/18  Yes Storm Frisk, MD  Insulin Degludec (TRESIBA) 100 UNIT/ML SOLN Inject 52 Units into the skin daily. 10/05/18  Yes Storm Frisk, MD  insulin lispro (HUMALOG) 100 UNIT/ML injection Inject 0.15 mLs (15 Units total) into the skin 2 (two) times daily with a meal. 10/05/18  Yes Storm Frisk, MD  lamoTRIgine (LAMICTAL) 25 MG tablet Take 1 tablet (25 mg total) by mouth 2 (two) times daily. 10/05/18  Yes Storm Frisk, MD  metoCLOPramide (REGLAN) 10 MG tablet Take 1 tablet (10 mg total) by mouth 4 (four) times daily -  before meals and at bedtime. 10/15/18 11/14/18 Yes Jerald Kief, MD  Multiple Vitamin (MULTIVITAMIN WITH MINERALS) TABS tablet Take 1 tablet by mouth daily. 09/18/18  Yes Clapacs, Jackquline Denmark, MD  pantoprazole (PROTONIX) 40 MG tablet Take 1 tablet (40 mg total) by mouth daily. 10/16/18 11/15/18 Yes Jerald Kief, MD  promethazine (PHENERGAN) 25 MG suppository Place 1 suppository (25 mg total) rectally every 6 (six) hours as needed for nausea or vomiting. 10/16/18  Yes Liberty Handy, PA-C  propranolol (INDERAL) 10 MG tablet Take 1 tablet (10 mg total) by mouth 3 (three) times daily. 10/15/18 11/14/18 Yes Jerald Kief, MD  topiramate (TOPAMAX) 25 MG tablet Take 1 tablet (25 mg total) by mouth 2 (two) times daily. 10/05/18  Yes Storm Frisk, MD  traZODone (DESYREL) 100 MG tablet Take 3 tablets (300 mg total) by mouth at bedtime. 10/05/18  Yes Hoy Register, MD  lubiprostone (AMITIZA) 24 MCG capsule Take 1 capsule (24 mcg total) by mouth 2 (two) times daily with a meal. 10/27/18   Esterwood, Amy S, PA-C    Family History Family History  Problem Relation Age of Onset  . Diabetes Mother   . Hypertension Mother   . Obesity Mother   . Asthma Mother   . Allergic rhinitis Mother   . Eczema Mother   . Cervical cancer Mother   . Diabetes Father   . Hypertension Father   . Obesity Father   . Hyperlipidemia Father   .  Hypertension Paternal Aunt   . Hypertension Maternal Grandfather   . Colon cancer Maternal Grandfather   . Diabetes Paternal Grandmother   . Obesity Paternal Grandmother   . Diabetes Paternal Grandfather   . Obesity Paternal Grandfather   . Angioedema Neg Hx   . Immunodeficiency Neg Hx   . Urticaria Neg Hx   . Stomach cancer Neg Hx   . Esophageal cancer Neg Hx     Social History Social History   Tobacco Use  . Smoking status: Never Smoker  . Smokeless tobacco: Never Used  Substance Use Topics  . Alcohol use: No    Alcohol/week: 0.0 standard drinks  . Drug use: No  Allergies   Ibuprofen   Review of Systems Review of Systems  All other systems reviewed and are negative.    Physical Exam Updated Vital Signs BP 134/90 (BP Location: Left Arm)   Pulse 100   Temp 99.1 F (37.3 C) (Oral)   Resp 18   LMP 10/19/2018 Comment: NEG BETA HCG 11/01/18  SpO2 96%   Physical Exam Vitals signs and nursing note reviewed.  Constitutional:      Appearance: She is well-developed.  HENT:     Head: Normocephalic and atraumatic.  Eyes:     General: No scleral icterus.       Right eye: No discharge.        Left eye: No discharge.     Conjunctiva/sclera: Conjunctivae normal.     Pupils: Pupils are equal, round, and reactive to light.  Neck:     Musculoskeletal: Normal range of motion.     Vascular: No JVD.     Trachea: No tracheal deviation.  Pulmonary:     Effort: Pulmonary effort is normal.     Breath sounds: No stridor.  Musculoskeletal:     Comments: Joint compartments are soft but tender to palpation throughout  Neurological:     Mental Status: She is alert and oriented to person, place, and time.     Coordination: Coordination normal.  Psychiatric:        Behavior: Behavior normal.        Thought Content: Thought content normal.        Judgment: Judgment normal.      ED Treatments / Results  Labs (all labs ordered are listed, but only abnormal results are  displayed) Labs Reviewed  COMPREHENSIVE METABOLIC PANEL - Abnormal; Notable for the following components:      Result Value   Glucose, Bld 463 (*)    AST 14 (*)    Total Bilirubin 0.2 (*)    All other components within normal limits  ACETAMINOPHEN LEVEL - Abnormal; Notable for the following components:   Acetaminophen (Tylenol), Serum <10 (*)    All other components within normal limits  URINALYSIS, ROUTINE W REFLEX MICROSCOPIC - Abnormal; Notable for the following components:   Color, Urine COLORLESS (*)    Glucose, UA >=500 (*)    All other components within normal limits  CBG MONITORING, ED - Abnormal; Notable for the following components:   Glucose-Capillary 450 (*)    All other components within normal limits  CBG MONITORING, ED - Abnormal; Notable for the following components:   Glucose-Capillary 387 (*)    All other components within normal limits  CBG MONITORING, ED - Abnormal; Notable for the following components:   Glucose-Capillary 359 (*)    All other components within normal limits  CBG MONITORING, ED - Abnormal; Notable for the following components:   Glucose-Capillary 353 (*)    All other components within normal limits  CBG MONITORING, ED - Abnormal; Notable for the following components:   Glucose-Capillary 276 (*)    All other components within normal limits  SARS CORONAVIRUS 2 BY RT PCR (HOSPITAL ORDER, [place] [facility])  ETHANOL  SALICYLATE LEVEL  CBC  RAPID URINE DRUG SCREEN, HOSP PERFORMED  I-STAT BETA HCG BLOOD, ED ([facility], [facility], [facility] ONLY)    EKG None  Radiology No results found.  Procedures Procedures (including critical care time)  Medications Ordered in ED Medications  amLODipine (NORVASC) tablet 5 mg (5 mg Oral Given 11/03/18 1454)  ARIPiprazole (ABILIFY) tablet 20  mg (20 mg Oral Given 11/03/18 1454)  atorvastatin (LIPITOR) tablet 20 mg (has no administration in time range)  FLUoxetine (PROZAC) capsule 40 mg (has no  administration in time range)  insulin aspart (novoLOG) injection 15 Units (has no administration in time range)  lamoTRIgine (LAMICTAL) tablet 25 mg (25 mg Oral Given 11/03/18 1454)  lubiprostone (AMITIZA) capsule 24 mcg (has no administration in time range)  multivitamin with minerals tablet 1 tablet (1 tablet Oral Given 11/03/18 1454)  pantoprazole (PROTONIX) EC tablet 40 mg (40 mg Oral Given 11/03/18 1610)  propranolol (INDERAL) tablet 10 mg (has no administration in time range)  topiramate (TOPAMAX) tablet 25 mg (25 mg Oral Given 11/03/18 1610)  traZODone (DESYREL) tablet 300 mg (has no administration in time range)  insulin glargine (LANTUS) injection 42 Units (has no administration in time range)  sodium chloride 0.9 % bolus 500 mL (has no administration in time range)  insulin aspart (novoLOG) injection 15 Units (15 Units Subcutaneous Given 11/03/18 1116)  acetaminophen (TYLENOL) tablet 1,000 mg (1,000 mg Oral Given 11/03/18 1116)  insulin aspart (novoLOG) injection 10 Units (10 Units Subcutaneous Given 11/03/18 1609)  hydrOXYzine (ATARAX/VISTARIL) tablet 25 mg (25 mg Oral Given 11/03/18 1610)     Initial Impression / Assessment and Plan / ED Course  I have reviewed the triage vital signs and the nursing notes.  Pertinent labs & imaging results that were available during my care of the patient were reviewed by me and considered in my medical decision making (see chart for details).        18 year old female presents today with complaints of suicidal ideation.  Patient also is hyperglycemic here.  She has no signs of DKA or hyperosmolar state.  She was given insulin.  Do not anticipate any further interventions for blood sugar.  Patient medically cleared awaiting TTS evaluation.  Patient's blood sugar remains elevated here.  Each time that I have evaluated her she has had some sort of carbohydrate including chips sitting by her.  Staff made aware that she needs to have low-carb  diet.  Patient has a blood sugar reading of 276.  I will give her a small bolus of fluid and reassess her blood sugar.  She is awaiting disposition has been excepted to behavioral health but they will not allow her to come with blood sugar over 200.  I do think with small amount of fluids, reducing her oral intake of carbs that her blood sugar will be within a reasonable range this evening.  Patient care will be signed to oncoming provider pending recheck of blood sugar.  Final Clinical Impressions(s) / ED Diagnoses   Final diagnoses:  Suicidal ideation  Hyperglycemia    ED Discharge Orders    None       Rosalio Loud 11/03/18 1814    Little, Ambrose Finland, MD 11/04/18 [phone]

## 2018-11-03 NOTE — ED Notes (Signed)
Spoke with [facility].  They will accept pt when her blood sugar gets below 100.

## 2018-11-03 NOTE — ED Notes (Signed)
Checked patient cbg it was 359 notified the RN Hassan Rowan of blood sugar patient is resting with sitter at bedside

## 2018-11-03 NOTE — ED Notes (Signed)
Pt has a bed ready at the facility she's accepted at, she can be sent tonight just call with a heads up shes coming
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
## 2018-11-03 NOTE — BHH Counselor (Signed)
TTS waiting for patient to be placed in room, and for cart to be set up and will start assessment once notified by nurse Bethena Midget.

## 2018-11-03 NOTE — BH Assessment (Signed)
10/28: Per Elmyra Ricks, Patient accepted to [facility] in [place]. The accepting provider is Nelida Gores, MD. Room #306. The nursing report number [phone]. Patient's nurse/Phil made aware of patient's disposition.

## 2018-11-03 NOTE — Progress Notes (Addendum)
Inpatient Diabetes Program Recommendations  AACE/ADA: New Consensus Statement on Inpatient Glycemic Control (2015)  Target Ranges:  Prepandial:   less than 140 mg/dL      Peak postprandial:   less than 180 mg/dL (1-2 hours)      Critically ill patients:  140 - 180 mg/dL   Lab Results  Component Value Date   GLUCAP 387 (H) 11/03/2018   HGBA1C 12.9 (A) 07/13/2018    Review of Glycemic Control Results for ARCHER, VISE (MRN [id number]) as of 11/03/2018 14:37  Ref. Range 10/30/2018 18:39 10/30/2018 20:55 11/03/2018 03:45 11/03/2018 09:49  Glucose-Capillary Latest Ref Range: 70 - 99 mg/dL 355 (H) 229 (H) 450 (H) 387 (H)   Diabetes history: DM 2 Outpatient Diabetes medications: Tresiba 52 QD + Humalog 15 BID  Current orders for Inpatient glycemic control:  Novolog 15 units x1, Novolog 15 units BID, Tresiba 52 units QD  Inpatient Diabetes Program Recommendations:   If to remain inpatient, consider: -Novolog 0-15 units TID & HS -May have to switch [place] to Lantus due to availability on formulary.  Thanks, Bronson Curb, MSN, RNC-OB Diabetes Coordinator [phone] (8a-5p)

## 2018-11-03 NOTE — ED Notes (Signed)
Pt's CBG result was 353. Informed Hassan Rowan - RN.

## 2018-11-03 NOTE — ED Triage Notes (Signed)
Pt BIB [facility] with multiple complaints. Reports that she is feeling suicidal with a plan to overdose on sleeping medication and that she is hyperglycemic. CBG 450 in triage, states she has been taking her meds appropriately.

## 2018-11-03 NOTE — BH Assessment (Signed)
Per Elmyra Ricks, Patient accepted to [facility] in [place]. The accepting provider is Nelida Gores, MD. Room #306. The nursing report number [id number].

## 2018-11-03 NOTE — ED Provider Notes (Signed)
Care assumed from J. Hedges  PA-C at shift change pending glucose recheck see his note for full H&P.   Plan is for recheck blood sugar after IV fluids.  Patient is already has received home dose of insulin as well as an additional 10 units because she continued to be hyperglycemic.  Patient has been eating chips and crackers upon exam per previous provider.  Patient's blood sugar was 205 after 500 mL IV fluids, patient given second 500 mL IV fluid bolus.  When rechecked glucose is 176.  Patient is medically clear.  She is resting comfortably on exam stretcher, in no acute distress.  No complaints at this time.  She is stable at time of disposition.  [facility] was accepted at [facility] [place] with accepting provider Dr. Nelida Gores.   Flint Melter 11/03/18 2319    Isla Pence, MD 11/07/18 [phone]

## 2018-11-03 NOTE — ED Notes (Signed)
Heart Healthy Diet was ordered for Patient. 

## 2018-11-04 LAB — CBG MONITORING, ED
Glucose-Capillary: 196 mg/dL — ABNORMAL HIGH (ref 70–99)
Glucose-Capillary: 224 mg/dL — ABNORMAL HIGH (ref 70–99)
Glucose-Capillary: 298 mg/dL — ABNORMAL HIGH (ref 70–99)
Glucose-Capillary: 324 mg/dL — ABNORMAL HIGH (ref 70–99)
Glucose-Capillary: 406 mg/dL — ABNORMAL HIGH (ref 70–99)

## 2018-11-04 MED ORDER — DOXYCYCLINE HYCLATE 100 MG PO TABS
100.0000 mg | ORAL_TABLET | Freq: Once | ORAL | Status: AC
  Administered 2018-11-04: 100 mg via ORAL
  Filled 2018-11-04: qty 1

## 2018-11-04 MED ORDER — ACETAMINOPHEN 325 MG PO TABS
650.0000 mg | ORAL_TABLET | Freq: Once | ORAL | Status: AC
  Administered 2018-11-04: 650 mg via ORAL
  Filled 2018-11-04: qty 2

## 2018-11-04 MED ORDER — ACETAMINOPHEN 500 MG PO TABS
1000.0000 mg | ORAL_TABLET | Freq: Once | ORAL | Status: AC
  Administered 2018-11-04: 1000 mg via ORAL
  Filled 2018-11-04: qty 2

## 2018-11-04 MED ORDER — MELOXICAM 7.5 MG PO TABS: 7.5000 mg | ORAL_TABLET | Freq: Once | ORAL | Status: DC

## 2018-11-04 NOTE — ED Notes (Signed)
Breakfast ordered 

## 2018-11-04 NOTE — Progress Notes (Signed)
Pt has declined the bed offer at [facility], reportedly feeling that [place] is too far away from home. She remains under review at the following hospitals:  [place], [place], [place], [place], [place], [place], [place], [facility], [place], [place], [place] Disposition [facility] BHH/TTS [phone]

## 2018-11-04 NOTE — ED Notes (Signed)
Pt refusing [facility]. Pt states she does not have a way to get home from there and would like some where closer

## 2018-11-04 NOTE — ED Notes (Signed)
Belongings in locker #10 

## 2018-11-04 NOTE — ED Provider Notes (Signed)
I was asked to evaluate the patient regarding swelling and pain to her leg.  Patient has what appears to be a small cyst or early abscess to the right upper inner thigh.  I do not feel as though this requires incision and drainage as it is very small.  She will be treated with doxycycline and warm compresses.    Patient has also asked for pain medication for this and generalized discomfort.  She will be given Tylenol 1000 mg.   Veryl Speak, MD 11/04/18 Vernelle Emerald

## 2018-11-04 NOTE — Progress Notes (Signed)
Inpatient Diabetes Program Recommendations  AACE/ADA: New Consensus Statement on Inpatient Glycemic Control   Target Ranges:  Prepandial:   less than 140 mg/dL      Peak postprandial:   less than 180 mg/dL (1-2 hours)      Critically ill patients:  140 - 180 mg/dL   Results for CASSANDRE, OLEKSY (MRN [id number]) as of 11/04/2018 10:48  Ref. Range 11/03/2018 09:49 11/03/2018 14:08 11/03/2018 15:51 11/03/2018 18:09 11/03/2018 20:36 11/03/2018 22:46 11/04/2018 00:44 11/04/2018 08:44  Glucose-Capillary Latest Ref Range: 70 - 99 mg/dL 387 (H) 359 (H) 353 (H) 276 (H) 205 (H) 176 (H) 224 (H) 406 (H)   Review of Glycemic Control  Diabetes history: DM2 Outpatient Diabetes medications: Tresiba 52 units daily, Humalog 15 units BID Current orders for Inpatient glycemic control: Lantus 42 units QHS, Novolog 15 units BID  Inpatient Diabetes Program Recommendations:   Insulin-Basal: Please consider increasing Lantus to 47 units QHS.  Insulin-Correction: Please consider ordering Novolog 0-15 units TID with meals and Novolog 0-5 units QHS.  Insulin-Meal Coverage: Please consider changing meal coverage to Novolog 10 units TID with meals if patient eats at least 50% of meals.   Thanks, Barnie Alderman, RN, MSN, CDE Diabetes Coordinator Inpatient Diabetes Program [phone] (Team Pager from 8am to 5pm)

## 2018-11-04 NOTE — BHH Counselor (Signed)
The Pt declined 2 previous placements due to not being able to have transportation back home.   The Pt states that she will only be able to have someone pick her up from [facility].   The Pt cannot be admitted to [facility] until her blood sugar is below 350 for 24 hours.  Per Nira Conn the Pt can be re-evaluated in the am to see if her blood sugar has lowered.  Lorenza Cambridge, [facility] Triage Specialist

## 2018-11-05 LAB — CBG MONITORING, ED
Glucose-Capillary: 247 mg/dL — ABNORMAL HIGH (ref 70–99)
Glucose-Capillary: 206 mg/dL — ABNORMAL HIGH (ref 70–99)
Glucose-Capillary: 284 mg/dL — ABNORMAL HIGH (ref 70–99)
Glucose-Capillary: 243 mg/dL — ABNORMAL HIGH (ref 70–99)
Glucose-Capillary: 271 mg/dL — ABNORMAL HIGH (ref 70–99)

## 2018-11-05 MED ORDER — ACETAMINOPHEN 325 MG PO TABS
650.0000 mg | ORAL_TABLET | Freq: Once | ORAL | Status: AC
  Administered 2018-11-05: 18:00:00 650 mg via ORAL
  Filled 2018-11-05: qty 2

## 2018-11-05 MED ORDER — INSULIN ASPART 100 UNIT/ML ~~LOC~~ SOLN
0.0000 [IU] | Freq: Every day | SUBCUTANEOUS | Status: DC
  Administered 2018-11-05: 22:00:00 3 [IU] via SUBCUTANEOUS

## 2018-11-05 MED ORDER — INSULIN GLARGINE 100 UNIT/ML ~~LOC~~ SOLN
47.0000 [IU] | Freq: Every day | SUBCUTANEOUS | Status: DC
  Administered 2018-11-05: 47 [IU] via SUBCUTANEOUS
  Filled 2018-11-05 (×2): qty 0.47

## 2018-11-05 MED ORDER — INSULIN ASPART 100 UNIT/ML ~~LOC~~ SOLN: 10.0000 [IU] | Freq: Three times a day (TID) | SUBCUTANEOUS | Status: DC

## 2018-11-05 MED ORDER — HYDROXYZINE HCL 25 MG PO TABS
25.0000 mg | ORAL_TABLET | Freq: Three times a day (TID) | ORAL | Status: DC | PRN
  Administered 2018-11-05: 19:00:00 25 mg via ORAL
  Filled 2018-11-05: qty 1

## 2018-11-05 MED ORDER — INSULIN ASPART 100 UNIT/ML ~~LOC~~ SOLN
0.0000 [IU] | Freq: Three times a day (TID) | SUBCUTANEOUS | Status: DC
  Administered 2018-11-05 – 2018-11-06 (×3): 5 [IU] via SUBCUTANEOUS
  Administered 2018-11-06: 13:00:00 8 [IU] via SUBCUTANEOUS

## 2018-11-05 NOTE — ED Provider Notes (Signed)
ED psychiatric rounding note  18 year old female who is been in this department for over 57 hours at this point.  Originally presented for suicidal ideations and hyperglycemia.  Medically cleared by previous team.  Elevated blood sugar thought to be secondary to patient's poor diet choices here in the ED drinking sodas and chips.  Patient was reassessed by Dr. Stark Jock yesterday, she was started on doxycycline for a small cyst or early abscess of her right upper inner thigh that does not necessitate incision and drainage.  She will continue to use warm compresses.  Per psychiatry notes patient awaiting inpatient behavioral health placement.  Covid negative Beta-hCG negative Acetaminophen level negative Salicylate level negative Ethanol negative CBG today 206 Urinalysis nonacute CMP with elevated glucose, no evidence of DKA CBC within normal limits UDS negative  Vital signs from 11/05/2018, 10:36 AM: Temperature 98.6 F, pulse 98, BP 101/55, respiratory rate 19, SPO2 100% on room air. Physical Exam  BP (!) 101/55 (BP Location: Right Arm)   Pulse 98   Temp 98.6 F (37 C) (Oral)   Resp 19   LMP 10/19/2018 Comment: NEG BETA HCG 11/01/18  SpO2 100%   Physical Exam Constitutional:      General: She is not in acute distress.    Appearance: Normal appearance. She is well-developed. She is not ill-appearing or diaphoretic.  HENT:     Head: Normocephalic and atraumatic.     Right Ear: External ear normal.     Left Ear: External ear normal.     Nose: Nose normal.  Eyes:     General: Vision grossly intact. Gaze aligned appropriately.  Neck:     Musculoskeletal: Normal range of motion.     Trachea: Trachea and phonation normal. No tracheal deviation.  Pulmonary:     Effort: Pulmonary effort is normal. No respiratory distress.  Musculoskeletal: Normal range of motion.  Skin:    General: Skin is warm and dry.  Neurological:     Mental Status: She is alert.     GCS: GCS eye subscore is  4. GCS verbal subscore is 5. GCS motor subscore is 6.     Comments: Speech is clear and goal oriented, follows commands Major Cranial nerves without deficit, no facial droop Moves extremities without ataxia, coordination intact  Psychiatric:        Behavior: Behavior normal.     ED Course/Procedures     Procedures  MDM  1:30 PM: Per RN no news to report, we are awaiting 24 hours of CBGs under 350 prior to inpatient placement.  Patient assessed lying in bed watching TV.  She reports that she is just finished her lunch and she enjoyed it.  She has no specific complaints, concerns or questions at this time.  She states understanding of care plan and is agreeable to above.  She is overall well-appearing and in no acute distress resting comfortably.  At this time there does not appear to be any evidence of an acute emergency medical condition and the patient remains medically cleared awaiting psychiatric placement.  Note: Portions of this report may have been transcribed using voice recognition software. Every effort was made to ensure accuracy; however, inadvertent computerized transcription errors may still be present.   Deliah Boston, PA-C 11/05/18 1332    Drenda Freeze, MD 11/08/18 [phone]

## 2018-11-05 NOTE — ED Notes (Signed)
Breakfast tray at bedside 

## 2018-11-05 NOTE — ED Notes (Signed)
Lunch tray at bedside. ?

## 2018-11-05 NOTE — ED Notes (Signed)
Dinner tray at bedside

## 2018-11-05 NOTE — ED Provider Notes (Signed)
Informed by RN that patient complaining of abdominal pain.  Tylenol was given.  I evaluated the patient she is laying on her stomach in bed sleeping, easily arousable to voice.  She reports that she had a generalized abdominal pain earlier and does not like what she was given for dinner, fish.  Examination of the abdomen reveals a soft nondistended abdomen without peritoneal signs.  She has asked me for a [place] sandwich to eat now, RN informed of patient's request.  Based on physical examination and patient requesting to eat at this time do not feel any additional testing or imaging is indicated, patient is well-appearing and in no acute distress.  Patient remains medically cleared at this time.  Note: Portions of this report may have been transcribed using voice recognition software. Every effort was made to ensure accuracy; however, inadvertent computerized transcription errors may still be present.    Gari Crown 11/05/18 1931    Rex Kras Wenda [place], MD 11/08/18 [phone]

## 2018-11-06 ENCOUNTER — Other Ambulatory Visit: Payer: Self-pay

## 2018-11-06 ENCOUNTER — Encounter (HOSPITAL_COMMUNITY): Payer: Self-pay

## 2018-11-06 ENCOUNTER — Inpatient Hospital Stay (HOSPITAL_COMMUNITY)
Admission: AD | Admit: 2018-11-06 | Discharge: 2018-11-07 | DRG: 885 | Disposition: A | Discharge status: Other Acute Inpatient Hospital | Payer: Medicaid Other | Source: Intra-hospital | Attending: Emergency Medicine | Admitting: Emergency Medicine

## 2018-11-06 DIAGNOSIS — F4310 Post-traumatic stress disorder, unspecified: Secondary | ICD-10-CM | POA: Diagnosis present

## 2018-11-06 DIAGNOSIS — Z62810 Personal history of physical and sexual abuse in childhood: Secondary | ICD-10-CM | POA: Diagnosis present

## 2018-11-06 DIAGNOSIS — Z794 Long term (current) use of insulin: Secondary | ICD-10-CM | POA: Diagnosis not present

## 2018-11-06 DIAGNOSIS — L02415 Cutaneous abscess of right lower limb: Secondary | ICD-10-CM | POA: Diagnosis present

## 2018-11-06 DIAGNOSIS — R519 Headache, unspecified: Secondary | ICD-10-CM | POA: Diagnosis not present

## 2018-11-06 DIAGNOSIS — Z79899 Other long term (current) drug therapy: Secondary | ICD-10-CM

## 2018-11-06 DIAGNOSIS — R45851 Suicidal ideations: Secondary | ICD-10-CM | POA: Diagnosis present

## 2018-11-06 DIAGNOSIS — Z915 Personal history of self-harm: Secondary | ICD-10-CM

## 2018-11-06 DIAGNOSIS — E109 Type 1 diabetes mellitus without complications: Secondary | ICD-10-CM | POA: Diagnosis present

## 2018-11-06 DIAGNOSIS — Z818 Family history of other mental and behavioral disorders: Secondary | ICD-10-CM

## 2018-11-06 DIAGNOSIS — G4700 Insomnia, unspecified: Secondary | ICD-10-CM | POA: Diagnosis present

## 2018-11-06 DIAGNOSIS — Z833 Family history of diabetes mellitus: Secondary | ICD-10-CM

## 2018-11-06 DIAGNOSIS — F419 Anxiety disorder, unspecified: Secondary | ICD-10-CM | POA: Diagnosis present

## 2018-11-06 DIAGNOSIS — R072 Precordial pain: Secondary | ICD-10-CM

## 2018-11-06 DIAGNOSIS — F332 Major depressive disorder, recurrent severe without psychotic features: Principal | ICD-10-CM | POA: Diagnosis present

## 2018-11-06 LAB — CBG MONITORING, ED
Glucose-Capillary: 230 mg/dL — ABNORMAL HIGH (ref 70–99)
Glucose-Capillary: 252 mg/dL — ABNORMAL HIGH (ref 70–99)

## 2018-11-06 LAB — GLUCOSE, CAPILLARY
Glucose-Capillary: 219 mg/dL — ABNORMAL HIGH (ref 70–99)
Glucose-Capillary: 239 mg/dL — ABNORMAL HIGH (ref 70–99)

## 2018-11-06 MED ORDER — ALUM & MAG HYDROXIDE-SIMETH 200-200-20 MG/5ML PO SUSP
30.0000 mL | ORAL | Status: DC | PRN
  Administered 2018-11-06: 19:00:00 30 mL via ORAL
  Filled 2018-11-06: qty 30

## 2018-11-06 MED ORDER — ADULT MULTIVITAMIN W/MINERALS CH
1.0000 | ORAL_TABLET | Freq: Every day | ORAL | Status: DC
  Administered 2018-11-07: 1 via ORAL
  Filled 2018-11-06 (×3): qty 1

## 2018-11-06 MED ORDER — LAMOTRIGINE 25 MG PO TABS
25.0000 mg | ORAL_TABLET | Freq: Two times a day (BID) | ORAL | Status: DC
  Administered 2018-11-06 – 2018-11-07 (×2): 25 mg via ORAL
  Filled 2018-11-06 (×6): qty 1

## 2018-11-06 MED ORDER — TRAZODONE HCL 150 MG PO TABS
300.0000 mg | ORAL_TABLET | Freq: Every day | ORAL | Status: DC
  Filled 2018-11-06: qty 2

## 2018-11-06 MED ORDER — ARIPIPRAZOLE 15 MG PO TABS
15.0000 mg | ORAL_TABLET | Freq: Every day | ORAL | Status: DC
  Administered 2018-11-07: 15 mg via ORAL
  Filled 2018-11-06 (×3): qty 1

## 2018-11-06 MED ORDER — INSULIN ASPART 100 UNIT/ML ~~LOC~~ SOLN
0.0000 [IU] | Freq: Three times a day (TID) | SUBCUTANEOUS | Status: DC
  Administered 2018-11-06: 5 [IU] via SUBCUTANEOUS
  Administered 2018-11-07: 15 [IU] via SUBCUTANEOUS
  Administered 2018-11-07: 11 [IU] via SUBCUTANEOUS
  Administered 2018-11-07: 5 [IU] via SUBCUTANEOUS

## 2018-11-06 MED ORDER — HYDROXYZINE HCL 25 MG PO TABS
25.0000 mg | ORAL_TABLET | Freq: Three times a day (TID) | ORAL | Status: DC | PRN
  Administered 2018-11-06: 21:00:00 25 mg via ORAL
  Filled 2018-11-06: qty 1

## 2018-11-06 MED ORDER — INSULIN GLARGINE 100 UNIT/ML ~~LOC~~ SOLN
47.0000 [IU] | Freq: Every day | SUBCUTANEOUS | Status: DC
  Administered 2018-11-06: 47 [IU] via SUBCUTANEOUS
  Filled 2018-11-06: qty 0.47

## 2018-11-06 MED ORDER — INSULIN ASPART 100 UNIT/ML ~~LOC~~ SOLN
0.0000 [IU] | Freq: Every day | SUBCUTANEOUS | Status: DC
  Administered 2018-11-06: 2 [IU] via SUBCUTANEOUS

## 2018-11-06 MED ORDER — TRAZODONE HCL 150 MG PO TABS
150.0000 mg | ORAL_TABLET | Freq: Once | ORAL | Status: AC
  Administered 2018-11-06: 150 mg via ORAL
  Filled 2018-11-06 (×2): qty 1

## 2018-11-06 MED ORDER — AMLODIPINE BESYLATE 5 MG PO TABS
5.0000 mg | ORAL_TABLET | Freq: Every day | ORAL | Status: DC
  Administered 2018-11-07: 5 mg via ORAL
  Filled 2018-11-06 (×3): qty 1

## 2018-11-06 MED ORDER — ACETAMINOPHEN 325 MG PO TABS
650.0000 mg | ORAL_TABLET | Freq: Four times a day (QID) | ORAL | Status: DC | PRN
  Administered 2018-11-06 – 2018-11-07 (×3): 650 mg via ORAL
  Filled 2018-11-06 (×3): qty 2

## 2018-11-06 MED ORDER — LUBIPROSTONE 24 MCG PO CAPS
24.0000 ug | ORAL_CAPSULE | Freq: Two times a day (BID) | ORAL | Status: DC | PRN
  Filled 2018-11-06: qty 1

## 2018-11-06 MED ORDER — PROPRANOLOL HCL 10 MG PO TABS
10.0000 mg | ORAL_TABLET | Freq: Three times a day (TID) | ORAL | Status: DC
  Administered 2018-11-06 – 2018-11-07 (×2): 10 mg via ORAL
  Filled 2018-11-06 (×8): qty 1

## 2018-11-06 MED ORDER — PANTOPRAZOLE SODIUM 40 MG PO TBEC
40.0000 mg | DELAYED_RELEASE_TABLET | Freq: Every day | ORAL | Status: DC
  Administered 2018-11-07: 40 mg via ORAL
  Filled 2018-11-06 (×3): qty 1

## 2018-11-06 MED ORDER — MAGNESIUM HYDROXIDE 400 MG/5ML PO SUSP: 30.0000 mL | Freq: Every day | ORAL | Status: DC | PRN

## 2018-11-06 MED ORDER — FLUOXETINE HCL 20 MG PO CAPS
40.0000 mg | ORAL_CAPSULE | Freq: Every day | ORAL | Status: DC
  Administered 2018-11-06: 21:00:00 40 mg via ORAL
  Filled 2018-11-06 (×3): qty 2

## 2018-11-06 MED ORDER — TOPIRAMATE 25 MG PO TABS
25.0000 mg | ORAL_TABLET | Freq: Two times a day (BID) | ORAL | Status: DC
  Administered 2018-11-06 – 2018-11-07 (×3): 25 mg via ORAL
  Filled 2018-11-06 (×6): qty 1

## 2018-11-06 MED ORDER — TRAZODONE HCL 150 MG PO TABS
150.0000 mg | ORAL_TABLET | Freq: Every evening | ORAL | Status: DC | PRN
  Administered 2018-11-06: 21:00:00 150 mg via ORAL
  Filled 2018-11-06: qty 1

## 2018-11-06 MED ORDER — ATORVASTATIN CALCIUM 10 MG PO TABS
20.0000 mg | ORAL_TABLET | Freq: Every day | ORAL | Status: DC
  Administered 2018-11-06 – 2018-11-07 (×2): 20 mg via ORAL
  Filled 2018-11-06: qty 2
  Filled 2018-11-06: qty 1
  Filled 2018-11-06: qty 2
  Filled 2018-11-06: qty 1

## 2018-11-06 MED ORDER — ARIPIPRAZOLE 10 MG PO TABS
20.0000 mg | ORAL_TABLET | Freq: Every day | ORAL | Status: DC
  Filled 2018-11-06: qty 2

## 2018-11-06 NOTE — ED Notes (Signed)
Patient was given a snack and drink. 

## 2018-11-06 NOTE — ED Notes (Signed)
The pt reports that she is allergic to oatmeal  Call made to the kitchen to tell them she does not want oatmeal

## 2018-11-06 NOTE — ED Notes (Signed)
Pt voiced understanding and agreement w/tx plan - accepted to [facility] - Pt signed consent form - Copy faxed to [facility] - Copy sent to Medical Records - Original placed in envelope for [facility]. Pt called family member/friend to notify of tx plan.

## 2018-11-06 NOTE — ED Notes (Signed)
Carb Modified Diet was ordered for Lunch. 

## 2018-11-06 NOTE — ED Notes (Signed)
Pt aware [place] advised will have Bed 301-1 ready for pt at 1300.

## 2018-11-06 NOTE — ED Notes (Signed)
Breakfast ordered 

## 2018-11-06 NOTE — ED Notes (Signed)
Patient CBG was 252, The Nurse was informed. 
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
## 2018-11-06 NOTE — Progress Notes (Signed)
Per [facility], pt has been accepted to [facility] bed 301-01. Accepting provider is Dr. Dwyane Dee. Attending provider is Dr.Cobos. Patient can arrive by 1:00pm. Number for report is [phone]. Patient is negative for COVID-19.  CSW contacted patient's nurse at [facility] named [place], [place] at [place], MSW, [place] Social Worker II/Disposition [facility]  Phone: [phone] Fax: [phone]

## 2018-11-06 NOTE — Progress Notes (Signed)
Patient is a 18 year old AA female admitted from [facility] ED for SI and increased depression. Pt has a hx of DM, seizures and HTN.  Pt reports increasing depression and states that she hasn't slept in 6 days Pt presents as depressed, calm and cooperative behavior, answered questions logically and coherently throughout admission interview. Pt currently denies SI/HI and A/V hallucinations.  VS obtained- Skin assessment revealed small cyst @ right upper thigh. No drainage or redness noted, but painful to touch at site. Belongings searched and secured in locker. Admission paperwork completed and signed- verbal understanding expressed. Fall precautions initiated due to recent fall (seizure). Patient oriented to unit

## 2018-11-06 NOTE — ED Notes (Signed)
[facility] called to inform that pt is allergic to oatmeal

## 2018-11-06 NOTE — BHH Suicide Risk Assessment (Signed)
[facility] Admission Suicide Risk Assessment   Nursing information obtained from:   Patient and chart Demographic factors:   18 year old female, lives with mother, college student Current Mental Status:   See below Loss Factors:   Strained relationship with mother Historical Factors:   History of depression, reports history of PTSD symptoms, history of prior psychiatric admission, history of medical illnesses (DM) Risk Reduction Factors:   Resilience  Total Time spent with patient: 45 minutes Principal Problem:  MDD  Diagnosis:  Active Problems:   MDD (major depressive disorder), recurrent episode, severe ([place])  Subjective Data:  Continued Clinical Symptoms:    The "Alcohol Use Disorders Identification Test", Guidelines for Use in Primary Care, Second Edition.  World Pharmacologist [facility]). Score between 0-7:  no or low risk or alcohol related problems. Score between 8-15:  moderate risk of alcohol related problems. Score between 16-19:  high risk of alcohol related problems. Score 20 or above:  warrants further diagnostic evaluation for alcohol dependence and treatment.   CLINICAL FACTORS:  18 year old female, presented to ED on 10/24 reporting seizures, with elevated CBG (493).  Also reported depression, suicidal ideations with thoughts of overdosing on rubbing alcohol and a recent overdose on trazodone about 2 weeks prior.  Attributes depression in part to strained relationship with mother and running out of her antidepressant medications.  Medical history is remarkable for diabetes mellitus on insulin.  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  See admit note [facility]    COGNITIVE FEATURES THAT CONTRIBUTE TO RISK:  Closed-mindedness and Loss of executive function    SUICIDE RISK:    Moderate:  Frequent suicidal ideation with limited intensity, and duration, some specificity in terms of plans, no associated intent, good self-control, limited dysphoria/symptomatology, some risk factors present, and identifiable protective factors, including available and accessible social support.  PLAN OF CARE: Patient will be admitted to inpatient psychiatric unit for stabilization and safety. Will provide and encourage milieu participation. Provide medication management and maked adjustments as needed.  Will follow daily.    I certify that inpatient services furnished can reasonably be expected to improve the patient's condition.   Jenne Campus, MD 11/06/2018, 6:09 PM

## 2018-11-06 NOTE — ED Notes (Signed)
ALL belongings - Safe Transport - Pt aware - verified all items present. Found 2nd bag w/pt's slides, shirt, and cell phone w/charger in [place] # 10 - not inventoried.
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
## 2018-11-07 ENCOUNTER — Other Ambulatory Visit: Payer: Self-pay

## 2018-11-07 ENCOUNTER — Inpatient Hospital Stay (HOSPITAL_COMMUNITY): Payer: Medicaid Other

## 2018-11-07 ENCOUNTER — Encounter (HOSPITAL_COMMUNITY): Payer: Self-pay

## 2018-11-07 ENCOUNTER — Inpatient Hospital Stay (HOSPITAL_COMMUNITY)
Admission: AD | Admit: 2018-11-07 | Discharge: 2018-11-11 | DRG: 885 | Disposition: A | Discharge status: Home / Self Care | Payer: Medicaid Other | Source: Intra-hospital | Attending: Psychiatry | Admitting: Psychiatry

## 2018-11-07 DIAGNOSIS — R45851 Suicidal ideations: Secondary | ICD-10-CM | POA: Diagnosis present

## 2018-11-07 DIAGNOSIS — Z915 Personal history of self-harm: Secondary | ICD-10-CM | POA: Diagnosis not present

## 2018-11-07 DIAGNOSIS — I110 Hypertensive heart disease with heart failure: Secondary | ICD-10-CM | POA: Diagnosis present

## 2018-11-07 DIAGNOSIS — G43909 Migraine, unspecified, not intractable, without status migrainosus: Secondary | ICD-10-CM | POA: Diagnosis present

## 2018-11-07 DIAGNOSIS — E1043 Type 1 diabetes mellitus with diabetic autonomic (poly)neuropathy: Secondary | ICD-10-CM | POA: Diagnosis present

## 2018-11-07 DIAGNOSIS — K5909 Other constipation: Secondary | ICD-10-CM | POA: Diagnosis present

## 2018-11-07 DIAGNOSIS — G4700 Insomnia, unspecified: Secondary | ICD-10-CM | POA: Diagnosis present

## 2018-11-07 DIAGNOSIS — E785 Hyperlipidemia, unspecified: Secondary | ICD-10-CM | POA: Diagnosis present

## 2018-11-07 DIAGNOSIS — Z8349 Family history of other endocrine, nutritional and metabolic diseases: Secondary | ICD-10-CM

## 2018-11-07 DIAGNOSIS — I509 Heart failure, unspecified: Secondary | ICD-10-CM | POA: Diagnosis present

## 2018-11-07 DIAGNOSIS — E1065 Type 1 diabetes mellitus with hyperglycemia: Secondary | ICD-10-CM | POA: Diagnosis present

## 2018-11-07 DIAGNOSIS — L02415 Cutaneous abscess of right lower limb: Secondary | ICD-10-CM | POA: Diagnosis not present

## 2018-11-07 DIAGNOSIS — Z91018 Allergy to other foods: Secondary | ICD-10-CM

## 2018-11-07 DIAGNOSIS — Z888 Allergy status to other drugs, medicaments and biological substances status: Secondary | ICD-10-CM

## 2018-11-07 DIAGNOSIS — Z794 Long term (current) use of insulin: Secondary | ICD-10-CM | POA: Diagnosis not present

## 2018-11-07 DIAGNOSIS — F515 Nightmare disorder: Secondary | ICD-10-CM | POA: Diagnosis present

## 2018-11-07 DIAGNOSIS — M545 Low back pain: Secondary | ICD-10-CM | POA: Diagnosis present

## 2018-11-07 DIAGNOSIS — F332 Major depressive disorder, recurrent severe without psychotic features: Principal | ICD-10-CM | POA: Diagnosis present

## 2018-11-07 DIAGNOSIS — Z8249 Family history of ischemic heart disease and other diseases of the circulatory system: Secondary | ICD-10-CM

## 2018-11-07 DIAGNOSIS — F419 Anxiety disorder, unspecified: Secondary | ICD-10-CM | POA: Diagnosis present

## 2018-11-07 DIAGNOSIS — Z79899 Other long term (current) drug therapy: Secondary | ICD-10-CM

## 2018-11-07 DIAGNOSIS — K3184 Gastroparesis: Secondary | ICD-10-CM | POA: Diagnosis present

## 2018-11-07 DIAGNOSIS — Z818 Family history of other mental and behavioral disorders: Secondary | ICD-10-CM

## 2018-11-07 DIAGNOSIS — R569 Unspecified convulsions: Secondary | ICD-10-CM | POA: Diagnosis present

## 2018-11-07 DIAGNOSIS — Z62810 Personal history of physical and sexual abuse in childhood: Secondary | ICD-10-CM | POA: Diagnosis present

## 2018-11-07 DIAGNOSIS — Z833 Family history of diabetes mellitus: Secondary | ICD-10-CM

## 2018-11-07 DIAGNOSIS — F4310 Post-traumatic stress disorder, unspecified: Secondary | ICD-10-CM | POA: Diagnosis present

## 2018-11-07 DIAGNOSIS — L83 Acanthosis nigricans: Secondary | ICD-10-CM | POA: Diagnosis present

## 2018-11-07 DIAGNOSIS — G8929 Other chronic pain: Secondary | ICD-10-CM | POA: Diagnosis present

## 2018-11-07 DIAGNOSIS — F322 Major depressive disorder, single episode, severe without psychotic features: Secondary | ICD-10-CM | POA: Diagnosis present

## 2018-11-07 LAB — GLUCOSE, CAPILLARY
Glucose-Capillary: 303 mg/dL — ABNORMAL HIGH (ref 70–99)
Glucose-Capillary: 363 mg/dL — ABNORMAL HIGH (ref 70–99)
Glucose-Capillary: 318 mg/dL — ABNORMAL HIGH (ref 70–99)

## 2018-11-07 LAB — TROPONIN I (HIGH SENSITIVITY)
Troponin I (High Sensitivity): <2 ng/L (ref <18)
Troponin I (High Sensitivity): <2 ng/L (ref <18)

## 2018-11-07 LAB — CBC WITH DIFFERENTIAL/PLATELET
Abs Immature Granulocytes: 0.05 10*3/uL (ref 0.00–0.07)
Basophils Absolute: 0 10*3/uL (ref 0.0–0.1)
Basophils Relative: 0 %
Eosinophils Absolute: 0.1 10*3/uL (ref 0.0–0.5)
Eosinophils Relative: 1 %
HCT: 36 % (ref 36.0–46.0)
Hemoglobin: 12.1 g/dL (ref 12.0–15.0)
Immature Granulocytes: 0 %
Lymphocytes Relative: 17 %
Lymphs Abs: 2.2 10*3/uL (ref 0.7–4.0)
MCH: 28.5 pg (ref 26.0–34.0)
MCHC: 33.6 g/dL (ref 30.0–36.0)
MCV: 84.9 fL (ref 80.0–100.0)
Monocytes Absolute: 1.4 10*3/uL — ABNORMAL HIGH (ref 0.1–1.0)
Monocytes Relative: 11 %
Neutro Abs: 8.8 10*3/uL — ABNORMAL HIGH (ref 1.7–7.7)
Neutrophils Relative %: 71 %
Platelets: 295 10*3/uL (ref 150–400)
RBC: 4.24 MIL/uL (ref 3.87–5.11)
RDW: 13.2 % (ref 11.5–15.5)
WBC: 12.5 10*3/uL — ABNORMAL HIGH (ref 4.0–10.5)
nRBC: 0 % (ref 0.0–0.2)

## 2018-11-07 LAB — LIPID PANEL
Cholesterol: 95 mg/dL (ref 0–200)
HDL: 52 mg/dL (ref >40)
LDL Cholesterol: 26 mg/dL (ref 0–99)
Total CHOL/HDL Ratio: 1.8 RATIO
Triglycerides: 86 mg/dL (ref <150)
VLDL: 17 mg/dL (ref 0–40)

## 2018-11-07 LAB — BASIC METABOLIC PANEL
Anion gap: 12 (ref 5–15)
BUN: 17 mg/dL (ref 6–20)
CO2: 20 mmol/L — ABNORMAL LOW (ref 22–32)
Calcium: 8.9 mg/dL (ref 8.9–10.3)
Chloride: 101 mmol/L (ref 98–111)
Creatinine, Ser: 0.51 mg/dL (ref 0.44–1.00)
GFR calc Af Amer: >60 mL/min (ref >60)
GFR calc non Af Amer: >60 mL/min (ref >60)
Glucose, Bld: 249 mg/dL — ABNORMAL HIGH (ref 70–99)
Potassium: 4.2 mmol/L (ref 3.5–5.1)
Sodium: 133 mmol/L — ABNORMAL LOW (ref 135–145)

## 2018-11-07 LAB — TSH: TSH: 1.157 u[IU]/mL (ref 0.350–4.500)

## 2018-11-07 LAB — CBG MONITORING, ED: Glucose-Capillary: 231 mg/dL — ABNORMAL HIGH (ref 70–99)

## 2018-11-07 MED ORDER — INSULIN ASPART 100 UNIT/ML ~~LOC~~ SOLN
0.0000 [IU] | Freq: Three times a day (TID) | SUBCUTANEOUS | Status: DC
  Administered 2018-11-08 (×2): 5 [IU] via SUBCUTANEOUS
  Administered 2018-11-08: 8 [IU] via SUBCUTANEOUS
  Administered 2018-11-09: 06:00:00 15 [IU] via SUBCUTANEOUS
  Administered 2018-11-09: 17:00:00 5 [IU] via SUBCUTANEOUS
  Administered 2018-11-09 – 2018-11-10 (×2): 2 [IU] via SUBCUTANEOUS
  Administered 2018-11-10: 17:00:00 3 [IU] via SUBCUTANEOUS
  Administered 2018-11-10: 5 [IU] via SUBCUTANEOUS
  Administered 2018-11-11 (×2): 3 [IU] via SUBCUTANEOUS

## 2018-11-07 MED ORDER — LAMOTRIGINE 25 MG PO TABS
ORAL_TABLET | ORAL | Status: AC
  Filled 2018-11-07: qty 1

## 2018-11-07 MED ORDER — FLUOXETINE HCL 20 MG PO CAPS
40.0000 mg | ORAL_CAPSULE | Freq: Every day | ORAL | Status: DC
  Administered 2018-11-07: 40 mg via ORAL
  Filled 2018-11-07 (×3): qty 2

## 2018-11-07 MED ORDER — INSULIN DEGLUDEC 100 UNIT/ML ~~LOC~~ SOLN: 52.0000 [IU] | Freq: Every day | SUBCUTANEOUS | Status: DC

## 2018-11-07 MED ORDER — INSULIN GLARGINE 100 UNIT/ML ~~LOC~~ SOLN
49.0000 [IU] | Freq: Every day | SUBCUTANEOUS | Status: DC
  Administered 2018-11-08: 49 [IU] via SUBCUTANEOUS

## 2018-11-07 MED ORDER — QUETIAPINE FUMARATE 50 MG PO TABS
50.0000 mg | ORAL_TABLET | Freq: Once | ORAL | Status: AC
  Administered 2018-11-07: 50 mg via ORAL
  Filled 2018-11-07 (×2): qty 1

## 2018-11-07 MED ORDER — ONDANSETRON HCL 4 MG/2ML IJ SOLN
4.0000 mg | Freq: Once | INTRAMUSCULAR | Status: AC
  Administered 2018-11-07: 4 mg via INTRAVENOUS
  Filled 2018-11-07: qty 2

## 2018-11-07 MED ORDER — INSULIN ASPART 100 UNIT/ML ~~LOC~~ SOLN
0.0000 [IU] | Freq: Every day | SUBCUTANEOUS | Status: DC
  Administered 2018-11-07: 4 [IU] via SUBCUTANEOUS
  Administered 2018-11-08: 2 [IU] via SUBCUTANEOUS
  Administered 2018-11-09: 22:00:00 3 [IU] via SUBCUTANEOUS
  Administered 2018-11-10: 2 [IU] via SUBCUTANEOUS

## 2018-11-07 MED ORDER — TRAZODONE HCL 100 MG PO TABS: 200.0000 mg | ORAL_TABLET | Freq: Every evening | ORAL | Status: DC | PRN

## 2018-11-07 MED ORDER — DOXYCYCLINE HYCLATE 100 MG PO TABS
100.0000 mg | ORAL_TABLET | Freq: Two times a day (BID) | ORAL | Status: AC
  Administered 2018-11-07 – 2018-11-10 (×7): 100 mg via ORAL
  Filled 2018-11-07 (×8): qty 1

## 2018-11-07 MED ORDER — TOPIRAMATE 25 MG PO TABS
25.0000 mg | ORAL_TABLET | Freq: Two times a day (BID) | ORAL | Status: DC
  Administered 2018-11-07 – 2018-11-11 (×8): 25 mg via ORAL
  Filled 2018-11-07 (×16): qty 1

## 2018-11-07 MED ORDER — ARIPIPRAZOLE 15 MG PO TABS
15.0000 mg | ORAL_TABLET | Freq: Every day | ORAL | Status: DC
  Filled 2018-11-07 (×3): qty 1

## 2018-11-07 MED ORDER — ASPIRIN 81 MG PO CHEW
162.0000 mg | CHEWABLE_TABLET | Freq: Once | ORAL | Status: AC
  Filled 2018-11-07: qty 2

## 2018-11-07 MED ORDER — NITROGLYCERIN 0.4 MG SL SUBL
0.4000 mg | SUBLINGUAL_TABLET | SUBLINGUAL | Status: DC | PRN
  Administered 2018-11-07: 0.4 mg via SUBLINGUAL
  Filled 2018-11-07 (×2): qty 1

## 2018-11-07 MED ORDER — PROPRANOLOL HCL 10 MG PO TABS
10.0000 mg | ORAL_TABLET | Freq: Three times a day (TID) | ORAL | Status: DC
  Administered 2018-11-08 – 2018-11-10 (×7): 10 mg via ORAL
  Filled 2018-11-07 (×13): qty 1

## 2018-11-07 MED ORDER — DULOXETINE HCL 20 MG PO CPEP: 20.0000 mg | ORAL_CAPSULE | Freq: Every day | ORAL | Status: DC

## 2018-11-07 MED ORDER — METOCLOPRAMIDE HCL 10 MG PO TABS
10.0000 mg | ORAL_TABLET | Freq: Three times a day (TID) | ORAL | Status: DC
  Administered 2018-11-07 – 2018-11-11 (×15): 10 mg via ORAL
  Filled 2018-11-07 (×25): qty 1

## 2018-11-07 MED ORDER — TRAZODONE HCL 150 MG PO TABS
300.0000 mg | ORAL_TABLET | Freq: Every day | ORAL | Status: DC
  Filled 2018-11-07 (×2): qty 2

## 2018-11-07 MED ORDER — AMLODIPINE BESYLATE 5 MG PO TABS
5.0000 mg | ORAL_TABLET | Freq: Every day | ORAL | Status: DC
  Administered 2018-11-08 – 2018-11-11 (×4): 5 mg via ORAL
  Filled 2018-11-07 (×8): qty 1

## 2018-11-07 MED ORDER — ATORVASTATIN CALCIUM 20 MG PO TABS
20.0000 mg | ORAL_TABLET | Freq: Every day | ORAL | Status: DC
  Administered 2018-11-08 – 2018-11-10 (×3): 20 mg via ORAL
  Filled 2018-11-07 (×5): qty 1

## 2018-11-07 MED ORDER — ADULT MULTIVITAMIN W/MINERALS CH
1.0000 | ORAL_TABLET | Freq: Every day | ORAL | Status: DC
  Administered 2018-11-08 – 2018-11-11 (×4): 1 via ORAL
  Filled 2018-11-07 (×7): qty 1

## 2018-11-07 MED ORDER — ARIPIPRAZOLE 10 MG PO TABS: 10.0000 mg | ORAL_TABLET | Freq: Every day | ORAL | Status: DC

## 2018-11-07 MED ORDER — INSULIN ASPART 100 UNIT/ML ~~LOC~~ SOLN: 15.0000 [IU] | Freq: Two times a day (BID) | SUBCUTANEOUS | Status: DC

## 2018-11-07 MED ORDER — ASPIRIN 81 MG PO CHEW
CHEWABLE_TABLET | ORAL | Status: AC
  Administered 2018-11-07: 162 mg via ORAL
  Filled 2018-11-07: qty 2

## 2018-11-07 MED ORDER — LUBIPROSTONE 24 MCG PO CAPS
24.0000 ug | ORAL_CAPSULE | Freq: Two times a day (BID) | ORAL | Status: DC
  Administered 2018-11-08 – 2018-11-11 (×7): 24 ug via ORAL
  Filled 2018-11-07 (×13): qty 1

## 2018-11-07 MED ORDER — ARIPIPRAZOLE 10 MG PO TABS
20.0000 mg | ORAL_TABLET | Freq: Every day | ORAL | Status: DC
  Filled 2018-11-07: qty 2

## 2018-11-07 MED ORDER — ASPIRIN 81 MG PO CHEW
162.0000 mg | CHEWABLE_TABLET | Freq: Once | ORAL | Status: AC
  Administered 2018-11-07 (×2): 162 mg via ORAL
  Filled 2018-11-07 (×2): qty 2

## 2018-11-07 MED ORDER — LAMOTRIGINE 25 MG PO TABS
25.0000 mg | ORAL_TABLET | Freq: Two times a day (BID) | ORAL | Status: DC
  Administered 2018-11-07 – 2018-11-10 (×6): 25 mg via ORAL
  Filled 2018-11-07 (×11): qty 1

## 2018-11-07 MED ORDER — HYDROXYZINE HCL 25 MG PO TABS
ORAL_TABLET | ORAL | Status: AC
  Administered 2018-11-07: 20:00:00
  Filled 2018-11-07: qty 1

## 2018-11-07 MED ORDER — INSULIN GLARGINE 100 UNIT/ML ~~LOC~~ SOLN
49.0000 [IU] | Freq: Every day | SUBCUTANEOUS | Status: DC
  Filled 2018-11-07: qty 0.49

## 2018-11-07 MED ORDER — PANTOPRAZOLE SODIUM 40 MG PO TBEC
40.0000 mg | DELAYED_RELEASE_TABLET | Freq: Every day | ORAL | Status: DC
  Administered 2018-11-08 – 2018-11-10 (×3): 40 mg via ORAL
  Filled 2018-11-07 (×5): qty 1

## 2018-11-07 MED ORDER — HYDROXYZINE HCL 25 MG PO TABS
25.0000 mg | ORAL_TABLET | Freq: Three times a day (TID) | ORAL | Status: DC | PRN
  Administered 2018-11-08 – 2018-11-10 (×7): 25 mg via ORAL
  Filled 2018-11-07 (×6): qty 1

## 2018-11-07 MED ORDER — DOXYCYCLINE HYCLATE 100 MG PO TABS
100.0000 mg | ORAL_TABLET | Freq: Two times a day (BID) | ORAL | Status: DC
  Administered 2018-11-07: 100 mg via ORAL
  Filled 2018-11-07 (×3): qty 1

## 2018-11-07 MED ORDER — LAMOTRIGINE 25 MG PO TABS
25.0000 mg | ORAL_TABLET | Freq: Every day | ORAL | Status: DC
  Filled 2018-11-07: qty 1

## 2018-11-07 MED ORDER — INSULIN ASPART 100 UNIT/ML ~~LOC~~ SOLN: 6.0000 [IU] | Freq: Three times a day (TID) | SUBCUTANEOUS | Status: DC

## 2018-11-07 NOTE — ED Provider Notes (Signed)
[facility]   CSN: [id number] Arrival date & time: 11/07/18  0549     History   Chief Complaint Chief Complaint  Patient presents with  . Chest Pain    HPI Nancy [name] is a 18 y.o. female.     The history is provided by the patient.  Chest Pain Pain location:  L chest Pain quality: pressure   Pain radiates to:  L shoulder Pain severity:  Severe Onset quality:  Gradual Timing:  Constant Progression:  Unchanged Chronicity:  New Relieved by:  Nothing Worsened by:  Nothing Ineffective treatments:  Aspirin Associated symptoms: shortness of breath   Patient with history of obesity, diabetes, CHF presents with chest pain Patient just left the ER and was admitted to behavioral health for suicidal ideations She reports approximately 12 PM yesterday she began having left-sided chest pain.  She reports feeling short of breath at times.  She also reports feeling hot and clammy.  She reports a history of stent placement recently in her heart   Past Medical History:  Diagnosis Date  . Acanthosis nigricans   . Anxiety   . CHF (congestive heart failure) (HCC)   . Chronic lower back pain   . Depression   . DKA, type 1 (HCC) 09/13/2018  . Dyspepsia   . Obesity   . Ovarian cyst    pt is not aware of this hx (11/24/2017)  . Pre-diabetes   . 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 Premature baby   . Seizures (HCC)   . Type II diabetes mellitus (HCC)    insulin dependant    Patient Active Problem List   Diagnosis Date Noted  . MDD (major depressive disorder), recurrent episode, severe (HCC) 11/06/2018  . Nonspecific abnormal electrocardiogram (ECG) (EKG)   . Chest pain of uncertain etiology   . DKA, type 1 (HCC) 10/08/2018  . Hypertensive urgency 10/08/2018  . Conversion disorder with attacks or seizures, acute episode, with psychological stressor 09/16/2018  . Suicidal ideations 09/13/2018  . MDD (major depressive  disorder), recurrent severe, without psychosis (HCC)   . Syncope 01/30/2018  . Orthostatic hypotension 01/24/2018  . Chronic abdominal pain 12/24/2017  . Chest pain 12/19/2017  . Nausea and vomiting 08/21/2017  . Generalized abdominal pain 08/21/2017  . Non compliance with medical treatment 01/27/2012  . Adjustment disorder 09/16/2011  . DM (diabetes mellitus), type 1, uncontrolled (HCC) 09/15/2011  . Acanthosis nigricans   . Goiter   . Obesity 06/14/2010  . Hypertension 06/14/2010    Past Surgical History:  Procedure Laterality Date  . ABDOMINAL HERNIA REPAIR     "I was a baby"  . BIOPSY  10/12/2018   Procedure: BIOPSY;  Surgeon: Lynann BolognaGupta, Rajesh, MD;  [facility];  Service: Endoscopy;;  . ESOPHAGOGASTRODUODENOSCOPY (EGD) WITH PROPOFOL N/A 10/12/2018   Procedure: ESOPHAGOGASTRODUODENOSCOPY (EGD) WITH PROPOFOL;  Surgeon: Lynann BolognaGupta, Rajesh, MD;  [facility];  Service: Endoscopy;  Laterality: N/A;  . HERNIA REPAIR    . LEFT HEART CATH AND CORONARY ANGIOGRAPHY N/A 10/13/2018   Procedure: LEFT HEART CATH AND CORONARY ANGIOGRAPHY;  Surgeon: Kathleene HazelMcAlhany, Christopher D, MD;  Location: [facility] INVASIVE CV LAB;  Service: Cardiovascular;  Laterality: N/A;  . TONSILLECTOMY AND ADENOIDECTOMY    . WISDOM TOOTH EXTRACTION  2017     OB History   No obstetric history on file.      Home Medications    Prior to Admission medications   Medication Sig Start Date End  Date Taking? Authorizing Provider  amLODipine (NORVASC) 5 MG tablet Take 1 tablet (5 mg total) by mouth daily. 10/16/18 11/15/18  Jerald Kief, MD  ARIPiprazole (ABILIFY) 20 MG tablet Take 20 mg by mouth daily.    [provider]  atorvastatin (LIPITOR) 20 MG tablet Take 1 tablet (20 mg total) by mouth daily at 6 PM. 10/05/18   Storm Frisk, MD  FLUoxetine (PROZAC) 40 MG capsule Take 1 capsule (40 mg total) by mouth at bedtime. 10/05/18   Storm Frisk, MD  hydrOXYzine (ATARAX/VISTARIL) 25 MG tablet Take 1  tablet (25 mg total) by mouth 3 (three) times daily as needed for anxiety. 10/05/18   Storm Frisk, MD  Insulin Degludec (TRESIBA) 100 UNIT/ML SOLN Inject 52 Units into the skin daily. 10/05/18   Storm Frisk, MD  insulin lispro (HUMALOG) 100 UNIT/ML injection Inject 0.15 mLs (15 Units total) into the skin 2 (two) times daily with a meal. 10/05/18   Storm Frisk, MD  lamoTRIgine (LAMICTAL) 25 MG tablet Take 1 tablet (25 mg total) by mouth 2 (two) times daily. 10/05/18   Storm Frisk, MD  lubiprostone (AMITIZA) 24 MCG capsule Take 1 capsule (24 mcg total) by mouth 2 (two) times daily with a meal. 10/27/18   Esterwood, Amy S, PA-C  metoCLOPramide (REGLAN) 10 MG tablet Take 1 tablet (10 mg total) by mouth 4 (four) times daily -  before meals and at bedtime. 10/15/18 11/14/18  Jerald Kief, MD  Multiple Vitamin (MULTIVITAMIN WITH MINERALS) TABS tablet Take 1 tablet by mouth daily. 09/18/18   Clapacs, Jackquline Denmark, MD  pantoprazole (PROTONIX) 40 MG tablet Take 1 tablet (40 mg total) by mouth daily. 10/16/18 11/15/18  Jerald Kief, MD  promethazine (PHENERGAN) 25 MG suppository Place 1 suppository (25 mg total) rectally every 6 (six) hours as needed for nausea or vomiting. 10/16/18   Liberty Handy, PA-C  propranolol (INDERAL) 10 MG tablet Take 1 tablet (10 mg total) by mouth 3 (three) times daily. 10/15/18 11/14/18  Jerald Kief, MD  topiramate (TOPAMAX) 25 MG tablet Take 1 tablet (25 mg total) by mouth 2 (two) times daily. 10/05/18   Storm Frisk, MD  traZODone (DESYREL) 100 MG tablet Take 3 tablets (300 mg total) by mouth at bedtime. 10/05/18   Hoy Register, MD    Family History Family History  Problem Relation Age of Onset  . Diabetes Mother   . Hypertension Mother   . Obesity Mother   . Asthma Mother   . Allergic rhinitis Mother   . Eczema Mother   . Cervical cancer Mother   . Diabetes Father   . Hypertension Father   . Obesity Father   . Hyperlipidemia Father   .  Hypertension Paternal Aunt   . Hypertension Maternal Grandfather   . Colon cancer Maternal Grandfather   . Diabetes Paternal Grandmother   . Obesity Paternal Grandmother   . Diabetes Paternal Grandfather   . Obesity Paternal Grandfather   . Angioedema Neg Hx   . Immunodeficiency Neg Hx   . Urticaria Neg Hx   . Stomach cancer Neg Hx   . Esophageal cancer Neg Hx     Social History Social History   Tobacco Use  . Smoking status: Never Smoker  . Smokeless tobacco: Never Used  Substance Use Topics  . Alcohol use: No    Alcohol/week: 0.0 standard drinks  . Drug use: No     Allergies  Ibuprofen and Oatmeal   Review of Systems Review of Systems  Respiratory: Positive for shortness of breath.   Cardiovascular: Positive for chest pain.  Psychiatric/Behavioral: Positive for suicidal ideas.  All other systems reviewed and are negative.    Physical Exam Updated Vital Signs BP 119/69 (BP Location: Left Arm)   Pulse (!) 106   Temp 98.8 F (37.1 C) (Oral)   Resp (!) 22   Ht 1.6 m (5\' 3" )   Wt 86.6 kg   LMP 10/19/2018 Comment: NEG BETA HCG 11/01/18  SpO2 100%   BMI 33.83 kg/m   Physical Exam CONSTITUTIONAL: Well developed/well nourished, no acute distress HEAD: Normocephalic/atraumatic EYES: EOMI/PERRL ENMT: Mucous membranes moist NECK: supple no meningeal signs SPINE/BACK:entire spine nontender CV: S1/S2 noted, no murmurs/rubs/gallops noted LUNGS: Lungs are clear to auscultation bilaterally, no apparent distress ABDOMEN: soft, nontender, no rebound or guarding, bowel sounds noted throughout abdomen GU:no cva tenderness NEURO: Pt is awake/alert/appropriate, moves all extremitiesx4.  No facial droop.   EXTREMITIES: pulses normal/equal x4 full ROM SKIN: warm, color normal PSYCH: no abnormalities of mood noted, alert and oriented to situation   ED Treatments / Results  Labs (all labs ordered are listed, but only abnormal results are displayed) Labs Reviewed   GLUCOSE, CAPILLARY - Abnormal; Notable for the following components:      Result Value   Glucose-Capillary 219 (*)    All other components within normal limits  GLUCOSE, CAPILLARY - Abnormal; Notable for the following components:   Glucose-Capillary 239 (*)    All other components within normal limits  HEMOGLOBIN A1C  LIPID PANEL  TSH  BASIC METABOLIC PANEL  CBC WITH DIFFERENTIAL/PLATELET  TROPONIN I (HIGH SENSITIVITY)    EKG EKG Interpretation  Date/Time:  Sunday November 07 2018 06:17:33 EST Ventricular Rate:  108 PR Interval:    QRS Duration: 86 QT Interval:  333 QTC Calculation: 447 R Axis:   86 Text Interpretation: Sinus tachycardia ST elev, probable normal early repol pattern No significant change since last tracing Confirmed by Ripley Fraise ([id number]) on 11/07/2018 6:51:08 AM   Radiology Dg Chest Port 1 View  Result Date: 11/07/2018 CLINICAL DATA:  Left chest pain. EXAM: PORTABLE CHEST 1 VIEW COMPARISON:  10/30/2018 FINDINGS: The heart size and mediastinal contours are within normal limits. Both lungs are clear. The visualized skeletal structures are unremarkable. IMPRESSION: Normal examination. Electronically Signed   By: Claudie Revering M.D.   On: 11/07/2018 06:49    Procedures Procedures   Medications Ordered in ED Medications  acetaminophen (TYLENOL) tablet 650 mg (650 mg Oral Given 11/06/18 1900)  alum & mag hydroxide-simeth (MAALOX/MYLANTA) 200-200-20 MG/5ML suspension 30 mL (30 mLs Oral Given 11/06/18 1900)  magnesium hydroxide (MILK OF MAGNESIA) suspension 30 mL (has no administration in time range)  insulin aspart (novoLOG) injection 0-15 Units (5 Units Subcutaneous Given 11/06/18 1737)  insulin aspart (novoLOG) injection 0-5 Units (2 Units Subcutaneous Given 11/06/18 2129)  amLODipine (NORVASC) tablet 5 mg (has no administration in time range)  atorvastatin (LIPITOR) tablet 20 mg (20 mg Oral Given 11/06/18 1738)  FLUoxetine (PROZAC) capsule 40 mg (40 mg  Oral Given 11/06/18 2128)  hydrOXYzine (ATARAX/VISTARIL) tablet 25 mg (25 mg Oral Given 11/06/18 2048)  insulin glargine (LANTUS) injection 47 Units (47 Units Subcutaneous Given 11/06/18 2128)  lamoTRIgine (LAMICTAL) tablet 25 mg (25 mg Oral Given 11/06/18 1738)  lubiprostone (AMITIZA) capsule 24 mcg (has no administration in time range)  multivitamin with minerals tablet 1 tablet (has no administration in time range)  pantoprazole (PROTONIX) EC tablet 40 mg (has no administration in time range)  propranolol (INDERAL) tablet 10 mg (10 mg Oral Given 11/06/18 2051)  topiramate (TOPAMAX) tablet 25 mg (25 mg Oral Given 11/06/18 1738)  ARIPiprazole (ABILIFY) tablet 15 mg (has no administration in time range)  traZODone (DESYREL) tablet 150 mg (150 mg Oral Given 11/06/18 2128)  nitroGLYCERIN (NITROSTAT) SL tablet 0.4 mg (has no administration in time range)  doxycycline (VIBRA-TABS) tablet 100 mg (has no administration in time range)  traZODone (DESYREL) tablet 150 mg (150 mg Oral Given 11/06/18 2317)  aspirin chewable tablet 162 mg (162 mg Oral Given 11/07/18 0500)  aspirin chewable tablet 162 mg (0 mg Oral Duplicate 11/07/18 0541)     Initial Impression / Assessment and Plan / ED Course  I have reviewed the triage vital signs and the nursing notes.  Pertinent labs & imaging results that were available during my care of the patient were reviewed by me and considered in my medical decision making (see chart for details).        6:27 AM Patient presents from behavioral health for chest pain.  Patient underwent a left heart cardiac cath October 7.  Report recommendations include possible spontaneous dissection of the distal LAD but no obvious dissection flap could also be normal variant.  Patent coronaries found Patient also noted to have sinus tach before and on propranolol 6:53 AM No acute EKG changes.  Labs are pending 7:13 AM Patient now reports she has a boil in her thigh.  With nurse  present, located a small abscess noted to right medial thigh.  No fluctuance or crepitus.  Will start on oral antibiotics. Defer I&D at this time  Signed out to Dr. Jeraldine Loots with labs pending.  If there is no change in her troponin, she can be discharged back to behavioral health Final Clinical Impressions(s) / ED Diagnoses   Final diagnoses:  Precordial pain  Abscess of right thigh    ED Discharge Orders    None       Zadie Rhine, MD 11/07/18 [phone]

## 2018-11-07 NOTE — Progress Notes (Signed)
Inpatient Diabetes Program Recommendations  AACE/ADA: New Consensus Statement on Inpatient Glycemic Control  Target Ranges:  Prepandial:   less than 140 mg/dL      Peak postprandial:   less than 180 mg/dL (1-2 hours)      Critically ill patients:  140 - 180 mg/dL   Results for NAJLA, AUGHENBAUGH (MRN [id number]) as of 11/07/2018 14:14  Ref. Range 11/06/2018 07:05 11/06/2018 12:17 11/06/2018 16:54 11/06/2018 20:16 11/07/2018 09:29 11/07/2018 12:36  Glucose-Capillary Latest Ref Range: 70 - 99 mg/dL 230 (H) 252 (H) 219 (H) 239 (H) 231 (H) 303 (H)   Review of Glycemic Control   Diabetes history: DM2 Outpatient Diabetes medications: Tresiba 52 units daily, Humalog 15 units BID Current orders for Inpatient glycemic control: Lantus 47 units QHS, Novolog 0-15 units TID with meals, Novolog 0-5 units QHS  Inpatient Diabetes Program Recommendations:   Insulin-Meal Coverage: Please consider ordering Novolog 6 units TID with meals for meal coverage if patient eats at least 50% of meals.  Insulin-Basal: Please consider increasing Lantus to 49 units QHS.  Diet: Please discontinue Regular diet and order Carb Modified diet.  NOTE: Noted consult for Diabetes Coordinator. Diabetes Coordinator is not on campus over the weekend but available by pager from 8am to 5pm for questions or concerns. Chart reviewed.    Thanks, Barnie Alderman, RN, MSN, CDE Diabetes Coordinator Inpatient Diabetes Program [phone] (Team Pager from 8am to 5pm)

## 2018-11-07 NOTE — Progress Notes (Signed)
Patient reports that she has been having chest pain "all night." States that the pain is in her left chest and has been sharp at times and radiates down her left arm. Patient has a history of CHF, DM, nonepileptic seizures, and heart catherization with stent placement on 10/13/2018. Patient received 324 ASA. EKG is without ST elevation. EKG appears similar to EKG on 11/04/2018. Due to patient's recent history will transfer to ED for further evaluation.

## 2018-11-07 NOTE — ED Provider Notes (Signed)
11:00 AM Patient sitting upright, eating, in no distress.  Troponins unremarkable, patient appropriate for return to behavioral health.   Carmin Muskrat, MD 11/07/18 1100

## 2018-11-07 NOTE — Progress Notes (Addendum)
Patient transferred to [facility] for medical clearance.  Patient has been complaining of chest pain. EKG completed and per Gwenyth Bender., PA, patient sent via EMS. Camera operator at [facility] notified. [facility] accompanied patient to ED and will stay until relief arrives at 0700. Mother was notified that patient was sent over.

## 2018-11-07 NOTE — ED Triage Notes (Signed)
Pt arrived to ED via [facility] from [facility] with c/o left sided chest pain. [facility] admin 324 aspirin prior to EMS arrival. Pt states chest pain is currently 10/10. C/o accompanying shortness of breath and nausea, no vomiting. Hx of CHF, recent stent placement.

## 2018-11-07 NOTE — Progress Notes (Signed)
Patient ID: Nancy [name], female   DOB: 2000-08-18, 18 y.o.   MRN: [id number] Pt up c/o chest pain. Pt reports pain as pounding and sharp. Pt rated pain as 9 on a 0-10 scale. Pt requested aspirin which she takes when this happens at home. Corene Cornea, NP notified. Two chewable Asprin given. Will continue to reassess.

## 2018-11-07 NOTE — ED Notes (Signed)
Pt eating breakfast 

## 2018-11-07 NOTE — Progress Notes (Signed)
[place] NOVEL CORONAVIRUS (COVID-19) DAILY CHECK-OFF SYMPTOMS - answer yes or no to each - every day NO YES  Have you had a fever in the past 24 hours?  . Fever (Temp > 37.80C / 100F) X   Have you had any of these symptoms in the past 24 hours? . New Cough .  Sore Throat  .  Shortness of Breath .  Difficulty Breathing .  Unexplained Body Aches   X   Have you had any one of these symptoms in the past 24 hours not related to allergies?   . Runny Nose .  Nasal Congestion .  Sneezing   X   If you have had runny nose, nasal congestion, sneezing in the past 24 hours, has it worsened?  X   EXPOSURES - check yes or no X   Have you traveled outside the state in the past 14 days?  X   Have you been in contact with someone with a confirmed diagnosis of COVID-19 or PUI in the past 14 days without wearing appropriate PPE?  X   Have you been living in the same home as a person with confirmed diagnosis of COVID-19 or a PUI (household contact)?    X   Have you been diagnosed with COVID-19?    X              What to do next: Answered NO to all: Answered YES to anything:   Proceed with unit schedule Follow the [facility] Inpatient Flowsheet.   

## 2018-11-07 NOTE — Progress Notes (Signed)
   11/07/18 0006  Psych Admission Type (Psych Patients Only)  Admission Status Voluntary  Psychosocial Assessment  Patient Complaints Anxiety  Eye Contact Fair  Facial Expression Anxious  Affect Apprehensive  Speech Logical/coherent  Interaction Assertive  Motor Activity Other (Comment) (wnl)  Appearance/Hygiene Unremarkable  Behavior Characteristics Cooperative  Mood Anxious;Depressed  Thought Process  Coherency WDL  Content WDL  Delusions WDL;None reported or observed  Perception WDL  Hallucination None reported or observed  Judgment WDL  Confusion WDL  Danger to Self  Current suicidal ideation? Denies  Danger to Others  Danger to Others None reported or observed  D: Patient in dayroom playing uno with peers. Pt c/o increase anxiety.   A: Medications administered as prescribed. Support and encouragement provided as needed.  R: Patient remains safe on the unit. Will continue to monitor for safety and stability.

## 2018-11-07 NOTE — ED Notes (Signed)
Breakfast tray ordered 

## 2018-11-07 NOTE — Progress Notes (Signed)
[facility] MD Progress Note  11/07/2018 5:00 PM Nancy [name]  MRN:  [id number] Subjective: reports she had severe chest pain earlier this AM due to which she went to ED. Chest pain has now improved  . ( Was medically cleared in ED).  Reports persistent insomnia, slept fairly last night. Denies suicidal ideations . Denies side effects other than mild dizziness after taking her medications.  Objective : I have reviewed notes and have met with patient . 18 year old female, presented to ED on 10/24 reporting seizures, with elevated CBG (493).  Also reported depression, suicidal ideations with thoughts of overdosing on rubbing alcohol and a recent overdose on trazodone about 2 weeks prior.  Attributes depression in part to strained relationship with mother and running out of her antidepressant medications.  Medical history is remarkable for diabetes mellitus on insulin.  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  She reported increased /severe chest pain earlier today and was sent to ED . Work up was negative ( negative troponins/  reassuring EKG). She was medically cleared and returned to [facility]. She was also evaluated for abscess on R thigh, for which she was started on antibiotics. Reports persistent depression. Denies suicidal ideations and contracts for safety on unit . Tolerating medications well ( Abilify, Prozac) . Reports mild/temporary  dizziness after taking medications this AM. Patient reports that she does not feel  Prozac has been particularly helpful and states " even though I have taken it for a while, my depression just got worse". CBGs today 303, 363. Appreciate Nurse care coordinator recommendations regarding diabetic management.  Principal Problem:  Depression Diagnosis: Active Problems:   MDD (major depressive disorder), recurrent episode, severe ([place])  Total Time spent with patient: 20 minutes  Past Psychiatric History:    Past Medical History:  Past Medical History:  Diagnosis Date  . Acanthosis nigricans   . Anxiety   . CHF (congestive heart failure) ([place])   . Chronic lower back pain   . Depression   . DKA, type 1 ([place]) 09/13/2018  . Dyspepsia   . Obesity   . Ovarian cyst    pt is not aware of this hx (11/24/2017)  . Pre-diabetes   . 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 Premature baby   . Seizures ([place])   . Type II diabetes mellitus (H[facility])    insulin dependant    Past Surgical History:  Procedure Laterality Date  . ABDOMINAL HERNIA REPAIR     "I was a baby"  . BIOPSY  10/12/2018   Procedure: BIOPSY;  Surgeon: Jackquline Denmark, MD;  Location: [facility] ENDOSCOPY;  Service: Endoscopy;;  . ESOPHAGOGASTRODUODENOSCOPY (EGD) WITH PROPOFOL N/A 10/12/2018   Procedure: ESOPHAGOGASTRODUODENOSCOPY (EGD) WITH PROPOFOL;  Surgeon: Jackquline Denmark, MD;  Location: [facility] ENDOSCOPY;  Service: Endoscopy;  Laterality: N/A;  . HERNIA REPAIR    . LEFT HEART CATH AND CORONARY ANGIOGRAPHY N/A 10/13/2018   Procedure: LEFT HEART CATH AND CORONARY ANGIOGRAPHY;  Surgeon: Burnell Blanks, MD;  Location: [facility] LAB;  Service: Cardiovascular;  Laterality: N/A;  . TONSILLECTOMY AND ADENOIDECTOMY    . WISDOM TOOTH EXTRACTION  2017   Family History:  Family History  Problem Relation Age of Onset  . Diabetes Mother   . Hypertension Mother   . Obesity Mother   . Asthma Mother   . Allergic rhinitis Mother   . Eczema Mother   . Cervical cancer Mother   . Diabetes Father   . Hypertension  Father   . Obesity Father   . Hyperlipidemia Father   . Hypertension Paternal Aunt   . Hypertension Maternal Grandfather   . Colon cancer Maternal Grandfather   . Diabetes Paternal Grandmother   . Obesity Paternal Grandmother   . Diabetes Paternal Grandfather   . Obesity Paternal Grandfather   . Angioedema Neg Hx   . Immunodeficiency Neg Hx   . Urticaria Neg Hx   . Stomach cancer Neg Hx   . Esophageal cancer Neg Hx    Family  Psychiatric  History: Social History:  Social History   Substance and Sexual Activity  Alcohol Use No  . Alcohol/week: 0.0 standard drinks     Social History   Substance and Sexual Activity  Drug Use No    Social History   Socioeconomic History  . Marital status: Single    Spouse name: Not on file  . Number of children: 0  . Years of education: Not on file  . Highest education level: Not on file  Occupational History  . Occupation: Admission  Social Needs  . Financial resource strain: Not on file  . Food insecurity    Worry: Not on file    Inability: Not on file  . Transportation needs    Medical: Not on file    Non-medical: Not on file  Tobacco Use  . Smoking status: Never Smoker  . Smokeless tobacco: Never Used  Substance and Sexual Activity  . Alcohol use: No    Alcohol/week: 0.0 standard drinks  . Drug use: No  . Sexual activity: Never  Lifestyle  . Physical activity    Days per week: Not on file    Minutes per session: Not on file  . Stress: Not on file  Relationships  . Social Herbalist on phone: Not on file    Gets together: Not on file    Attends religious service: Not on file    Active member of club or organization: Not on file    Attends meetings of clubs or organizations: Not on file    Relationship status: Not on file  Other Topics Concern  . Not on file  Social History Narrative   Lives with mom and mom's girlfriend.   No EtOH, tobacco, Drugs   Additional Social History:     Sleep: Fair  Appetite:  Fair  Current Medications: Current Facility-Administered Medications  Medication Dose Route Frequency Provider Last Rate Last Dose  . acetaminophen (TYLENOL) tablet 650 mg  650 mg Oral Q6H PRN Suella Broad, FNP   650 mg at 11/07/18 1054  . alum & mag hydroxide-simeth (MAALOX/MYLANTA) 200-200-20 MG/5ML suspension 30 mL  30 mL Oral Q4H PRN Suella Broad, FNP   30 mL at 11/06/18 1900  . amLODipine (NORVASC)  tablet 5 mg  5 mg Oral Daily Suella Broad, FNP   5 mg at 11/07/18 0835  . ARIPiprazole (ABILIFY) tablet 15 mg  15 mg Oral Daily , Myer Peer, MD   15 mg at 11/07/18 0920  . atorvastatin (LIPITOR) tablet 20 mg  20 mg Oral q1800 Suella Broad, FNP   20 mg at 11/06/18 1738  . doxycycline (VIBRA-TABS) tablet 100 mg  100 mg Oral Q12H Ripley Fraise, MD   100 mg at 11/07/18 1028  . FLUoxetine (PROZAC) capsule 40 mg  40 mg Oral QHS Suella Broad, FNP   40 mg at 11/06/18 2128  . hydrOXYzine (ATARAX/VISTARIL) tablet 25 mg  25  mg Oral TID PRN Suella Broad, FNP   25 mg at 11/06/18 2048  . insulin aspart (novoLOG) injection 0-15 Units  0-15 Units Subcutaneous TID WC Suella Broad, FNP   11 Units at 11/07/18 1239  . insulin aspart (novoLOG) injection 0-5 Units  0-5 Units Subcutaneous QHS Suella Broad, FNP   2 Units at 11/06/18 2129  . insulin glargine (LANTUS) injection 47 Units  47 Units Subcutaneous QHS Suella Broad, FNP   47 Units at 11/06/18 2128  . lamoTRIgine (LAMICTAL) tablet 25 mg  25 mg Oral BID Suella Broad, FNP   25 mg at 11/07/18 0919  . lubiprostone (AMITIZA) capsule 24 mcg  24 mcg Oral BID PRN Suella Broad, FNP      . magnesium hydroxide (MILK OF MAGNESIA) suspension 30 mL  30 mL Oral Daily PRN Starkes-Perry, Gayland Curry, FNP      . multivitamin with minerals tablet 1 tablet  1 tablet Oral Daily Suella Broad, FNP   1 tablet at 11/07/18 0920  . nitroGLYCERIN (NITROSTAT) SL tablet 0.4 mg  0.4 mg Sublingual Q5 min PRN Ripley Fraise, MD   0.4 mg at 11/07/18 0715  . pantoprazole (PROTONIX) EC tablet 40 mg  40 mg Oral Daily Suella Broad, FNP   40 mg at 11/07/18 0836  . propranolol (INDERAL) tablet 10 mg  10 mg Oral TID Suella Broad, FNP   10 mg at 11/06/18 2051  . topiramate (TOPAMAX) tablet 25 mg  25 mg Oral BID Suella Broad, FNP   25 mg at 11/07/18 0920  . traZODone  (DESYREL) tablet 150 mg  150 mg Oral QHS PRN , Myer Peer, MD   150 mg at 11/06/18 2128   Current Outpatient Medications  Medication Sig Dispense Refill  . amLODipine (NORVASC) 5 MG tablet Take 1 tablet (5 mg total) by mouth daily. 30 tablet 0  . ARIPiprazole (ABILIFY) 20 MG tablet Take 20 mg by mouth daily.    Marland Kitchen atorvastatin (LIPITOR) 20 MG tablet Take 1 tablet (20 mg total) by mouth daily at 6 PM. 30 tablet 1  . FLUoxetine (PROZAC) 40 MG capsule Take 1 capsule (40 mg total) by mouth at bedtime. 30 capsule 1  . hydrOXYzine (ATARAX/VISTARIL) 25 MG tablet Take 1 tablet (25 mg total) by mouth 3 (three) times daily as needed for anxiety. 60 tablet 1  . Insulin Degludec (TRESIBA) 100 UNIT/ML SOLN Inject 52 Units into the skin daily. 10 mL 3  . insulin lispro (HUMALOG) 100 UNIT/ML injection Inject 0.15 mLs (15 Units total) into the skin 2 (two) times daily with a meal. 10 mL 6  . lamoTRIgine (LAMICTAL) 25 MG tablet Take 1 tablet (25 mg total) by mouth 2 (two) times daily. 60 tablet 1  . lubiprostone (AMITIZA) 24 MCG capsule Take 1 capsule (24 mcg total) by mouth 2 (two) times daily with a meal. 60 capsule 6  . metoCLOPramide (REGLAN) 10 MG tablet Take 1 tablet (10 mg total) by mouth 4 (four) times daily -  before meals and at bedtime. 120 tablet 0  . Multiple Vitamin (MULTIVITAMIN WITH MINERALS) TABS tablet Take 1 tablet by mouth daily. 30 tablet 1  . pantoprazole (PROTONIX) 40 MG tablet Take 1 tablet (40 mg total) by mouth daily. 30 tablet 0  . promethazine (PHENERGAN) 25 MG suppository Place 1 suppository (25 mg total) rectally every 6 (six) hours as needed for nausea or vomiting. 12 each 0  . propranolol (  INDERAL) 10 MG tablet Take 1 tablet (10 mg total) by mouth 3 (three) times daily. 90 tablet 0  . topiramate (TOPAMAX) 25 MG tablet Take 1 tablet (25 mg total) by mouth 2 (two) times daily. 60 tablet 1  . traZODone (DESYREL) 100 MG tablet Take 3 tablets (300 mg total) by mouth at bedtime. 90  tablet 2    Lab Results:  Results for orders placed or performed during the hospital encounter of 11/06/18 (from the past 48 hour(s))  Glucose, capillary     Status: Abnormal   Collection Time: 11/06/18  4:54 PM  Result Value Ref Range   Glucose-Capillary 219 (H) 70 - 99 mg/dL  Glucose, capillary     Status: Abnormal   Collection Time: 11/06/18  8:16 PM  Result Value Ref Range   Glucose-Capillary 239 (H) 70 - 99 mg/dL  Basic metabolic panel     Status: Abnormal   Collection Time: 11/07/18  7:14 AM  Result Value Ref Range   Sodium 133 (L) 135 - 145 mmol/L   Potassium 4.2 3.5 - 5.1 mmol/L   Chloride 101 98 - 111 mmol/L   CO2 20 (L) 22 - 32 mmol/L   Glucose, Bld 249 (H) 70 - 99 mg/dL   BUN 17 6 - 20 mg/dL   Creatinine, Ser 0.51 0.44 - 1.00 mg/dL   Calcium 8.9 8.9 - 10.3 mg/dL   GFR calc non Af Amer >60 >60 mL/min   GFR calc Af Amer >60 >60 mL/min   Anion gap 12 5 - 15    Comment: Performed at [facility], [place] [address], [place], [place] [address]  CBC with Differential/Platelet     Status: Abnormal   Collection Time: 11/07/18  7:14 AM  Result Value Ref Range   WBC 12.5 (H) 4.0 - 10.5 K/uL   RBC 4.24 3.87 - 5.11 MIL/uL   Hemoglobin 12.1 12.0 - 15.0 g/dL   HCT 36.0 36.0 - 46.0 %   MCV 84.9 80.0 - 100.0 fL   MCH 28.5 26.0 - 34.0 pg   MCHC 33.6 30.0 - 36.0 g/dL   RDW 13.2 11.5 - 15.5 %   Platelets 295 150 - 400 K/uL   nRBC 0.0 0.0 - 0.2 %   Neutrophils Relative % 71 %   Neutro Abs 8.8 (H) 1.7 - 7.7 K/uL   Lymphocytes Relative 17 %   Lymphs Abs 2.2 0.7 - 4.0 K/uL   Monocytes Relative 11 %   Monocytes Absolute 1.4 (H) 0.1 - 1.0 K/uL   Eosinophils Relative 1 %   Eosinophils Absolute 0.1 0.0 - 0.5 K/uL   Basophils Relative 0 %   Basophils Absolute 0.0 0.0 - 0.1 K/uL   Immature Granulocytes 0 %   Abs Immature Granulocytes 0.05 0.00 - 0.07 K/uL    Comment: Performed at [facility] [address], [place], [place] [address]  Troponin I (High Sensitivity)      Status: None   Collection Time: 11/07/18  7:14 AM  Result Value Ref Range   Troponin I (High Sensitivity) <2 <18 ng/L    Comment: Performed at [facility] [address] [place], [place] [address]  CBG monitoring, ED     Status: Abnormal   Collection Time: 11/07/18  9:29 AM  Result Value Ref Range   Glucose-Capillary 231 (H) 70 - 99 mg/dL  Troponin I (High Sensitivity)     Status: None   Collection Time: 11/07/18 10:35 AM  Result Value Ref Range   Troponin I (High Sensitivity) <2 <18 ng/L    Comment: Performed at [facility] [address], [place], [place] [address]  Glucose, capillary     Status: Abnormal   Collection Time: 11/07/18 12:36 PM  Result Value Ref Range   Glucose-Capillary 303 (H) 70 - 99 mg/dL  Glucose, capillary     Status: Abnormal   Collection Time: 11/07/18  4:55 PM  Result Value Ref Range   Glucose-Capillary 363 (H) 70 - 99 mg/dL    Blood Alcohol level:  Lab Results  Component Value Date   ETH <10 11/03/2018   ETH <10 90/30/0923    Metabolic Disorder Labs: Lab Results  Component Value Date   HGBA1C 12.9 (A) 07/13/2018   MPG 297.7 01/24/2018   MPG 263.26 12/19/2017   No results found for: PROLACTIN Lab Results  Component Value Date   CHOL 190 10/05/2018   TRIG 210 (H) 10/05/2018   HDL 84 10/05/2018   CHOLHDL 2.3 10/05/2018   VLDL 17 09/18/2012   LDLCALC 72 10/05/2018   LDLCALC 82 09/18/2012    Physical Findings: AIMS:  , ,  ,  ,    CIWA:    COWS:     Musculoskeletal: Strength & Muscle Tone: within normal limits Gait & Station: normal Patient leans: N/A  Psychiatric Specialty Exam: Physical Exam  ROS reports some dizziness earlier today. Chest pain is now improved, no dyspnea at room air, mild nausea, no vomiting   Blood pressure 107/65, pulse (!) 105, temperature 98.6 F (37 C), temperature source Oral, resp. rate 18, height _0  (1.6 m), weight 86.6 kg, last menstrual period 10/19/2018, SpO2 100 %.Body mass index is  33.83 kg/m.  General Appearance: Fairly Groomed  Eye Contact:  Fair  Speech:  Normal Rate  Volume:  Normal  Mood:  remains depressed  Affect:  constricted , slightly irritable  Thought Process:  Linear and Descriptions of Associations: Intact  Orientation:  Other:  fully alert and attentive\  Thought Content:  no hallucinations, no delusions , not internally preoccupied  Somatically focused   Suicidal Thoughts:  No denies current suicidal or self injurious ideations and contracts for safety, denies homicidal or violent ideations  Homicidal Thoughts:  No  Memory:  recent and remote grossly intact   Judgement:  Fair  Insight:  Fair  Psychomotor Activity:  Decreased  Concentration:  Concentration: Good and Attention Span: Good  Recall:  Good  Fund of Knowledge:  Good  Language:  Good  Akathisia:  Negative  Handed:  Right  AIMS (if indicated):     Assets:  Communication Skills Desire for Improvement Resilience  ADL's:  Intact  Cognition:  WNL  Sleep:      Assessment -  18 year old female, presented to ED on 10/24 reporting seizures, with elevated CBG (493).  Also reported depression, suicidal ideations with thoughts of overdosing on rubbing alcohol and a recent overdose on trazodone about 2 weeks prior.  Attributes depression in part to strained relationship with mother and running out of her antidepressant medications.  Medical history is remarkable for diabetes mellitus on insulin.  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  Patient reports persistent depression, denies SI , contracts for safety on unit. No psychotic symptoms. We have reviewed history today- mainly endorses history of depression and PTSD symptoms, describes brief mood swings lasting minutes or hours but no clear history of hypomania or mania .  No history of psychosis.  She states she does not feel that Prozac has been helpful and would rather change to  another antidepressant trial. We discussed options and expresses interest in Cymbalta as it may also help address pain issues . Side effects reviewed. Other medications were reviewed as well - she is on Topamax for migraine prophylaxis and on low dose Lamictal which she states " does not work". Has history of suspected pseudoseizures .  Agrees with efforts to simplify medication regimen gradually. Of note,she was evaluated in ED earlier todayfor chest pain , was medically cleared .    Treatment Plan Summary: Daily contact with patient to assess and evaluate symptoms and progress in treatment, Medication management, Plan inpatient treatment  and medications as below Encourage group and milieu participation Decrease Lamictal to 25 mgrs QDAY - consider tapering off  Decrease  Abilify 10 mgrs QDAY for mood disorder Discontinue Prozac - see above  Start Cymbalta 20 mgrs QDAY for depression, anxiety, pain symptoms Continue Topamax 25 mgrs BID for migraine prophylaxis  Increase Trazodone to 200 mgrs QHS PRN for insomnia Continue diabetic management- as recommended by Diabetic Nurse Care Coordinator, increase Lantus to 49 units QDAY and add Novolog 6 units TID with meals .  Treatment team working on disposition options.  Jenne Campus, MD 11/07/2018, 5:00 PM

## 2018-11-07 NOTE — Progress Notes (Signed)
[facility] Group Notes:  (Nursing/MHT/Case Management/Adjunct)  Date:  11/07/2018  Time: 2030  Type of Therapy:  wrap up group  Participation Level:  Active  Participation Quality:  Appropriate, Attentive, Sharing and Supportive  Affect:  Anxious  Cognitive:  Appropriate  Insight:  Improving  Engagement in Group:  Engaged  Modes of Intervention:  Clarification, Education and Support  Summary of Progress/Problems:  Nancy [name] 11/07/2018, 9:54 PM

## 2018-11-07 NOTE — Progress Notes (Addendum)
   11/07/18 1600  Psych Admission Type (Psych Patients Only)  Admission Status Voluntary  Psychosocial Assessment  Patient Complaints Anxiety  Eye Contact Fair  Facial Expression Anxious  Affect Apprehensive  Speech Logical/coherent  Interaction Assertive  Motor Activity Other (Comment) (wnl)  Appearance/Hygiene Unremarkable  Behavior Characteristics Cooperative  Mood Depressed;Anxious  Aggressive Behavior  Effect No apparent injury  Thought Process  Coherency WDL  Content WDL  Delusions WDL;None reported or observed  Perception WDL  Hallucination None reported or observed  Judgment WDL  Confusion WDL  Danger to Self  Current suicidal ideation? Denies  Danger to Others  Danger to Others None reported or observed

## 2018-11-07 NOTE — ED Notes (Signed)
Called report to [facility]. Pt cleared and ready for transport

## 2018-11-08 ENCOUNTER — Ambulatory Visit: Payer: Medicaid Other | Admitting: Nurse Practitioner

## 2018-11-08 DIAGNOSIS — F332 Major depressive disorder, recurrent severe without psychotic features: Principal | ICD-10-CM

## 2018-11-08 LAB — GLUCOSE, CAPILLARY
Glucose-Capillary: 296 mg/dL — ABNORMAL HIGH (ref 70–99)
Glucose-Capillary: 209 mg/dL — ABNORMAL HIGH (ref 70–99)
Glucose-Capillary: 232 mg/dL — ABNORMAL HIGH (ref 70–99)
Glucose-Capillary: 228 mg/dL — ABNORMAL HIGH (ref 70–99)

## 2018-11-08 LAB — HEMOGLOBIN A1C
Hgb A1c MFr Bld: 10.2 % — ABNORMAL HIGH (ref 4.8–5.6)
Mean Plasma Glucose: 246.04 mg/dL

## 2018-11-08 MED ORDER — ACETAMINOPHEN 325 MG PO TABS
650.0000 mg | ORAL_TABLET | Freq: Four times a day (QID) | ORAL | Status: DC | PRN
  Administered 2018-11-08 – 2018-11-11 (×6): 650 mg via ORAL
  Filled 2018-11-08 (×5): qty 2

## 2018-11-08 MED ORDER — ACETAMINOPHEN 325 MG PO TABS
ORAL_TABLET | ORAL | Status: AC
  Administered 2018-11-08: 05:00:00
  Filled 2018-11-08: qty 2

## 2018-11-08 MED ORDER — MIRTAZAPINE 7.5 MG PO TABS
7.5000 mg | ORAL_TABLET | Freq: Every day | ORAL | Status: DC
  Administered 2018-11-08 – 2018-11-10 (×3): 7.5 mg via ORAL
  Filled 2018-11-08 (×6): qty 1

## 2018-11-08 MED ORDER — DULOXETINE HCL 20 MG PO CPEP: 20.0000 mg | ORAL_CAPSULE | Freq: Every day | ORAL | Status: DC

## 2018-11-08 MED ORDER — INSULIN ASPART 100 UNIT/ML ~~LOC~~ SOLN
6.0000 [IU] | Freq: Three times a day (TID) | SUBCUTANEOUS | Status: DC
  Administered 2018-11-08 – 2018-11-09 (×3): 6 [IU] via SUBCUTANEOUS

## 2018-11-08 MED ORDER — ARIPIPRAZOLE 10 MG PO TABS
10.0000 mg | ORAL_TABLET | Freq: Every day | ORAL | Status: DC
  Administered 2018-11-08 – 2018-11-09 (×2): 10 mg via ORAL
  Filled 2018-11-08 (×2): qty 1

## 2018-11-08 NOTE — [facility] Group Notes (Signed)
LCSW Group Therapy Notes 11/08/2018 2:16 PM  Type of Therapy and Topic: Group Therapy: Overcoming Obstacles  Participation Level: Did Not Attend  Description of Group:  In this group patients will be encouraged to explore what they see as obstacles to their own wellness and recovery. They will be guided to discuss their thoughts, feelings, and behaviors related to these obstacles. The group will process together ways to cope with barriers, with attention given to specific choices patients can make. Each patient will be challenged to identify changes they are motivated to make in order to overcome their obstacles. This group will be process-oriented, with patients participating in exploration of their own experiences as well as giving and receiving support and challenge from other group members.  Therapeutic Goals: 1. Patient will identify personal and current obstacles as they relate to admission. 2. Patient will identify barriers that currently interfere with their wellness or overcoming obstacles.  3. Patient will identify feelings, thought process and behaviors related to these barriers. 4. Patient will identify two changes they are willing to make to overcome these obstacles:   Summary of Patient Progress n/a  Therapeutic Modalities:  Cognitive Behavioral Therapy Solution Focused Therapy Motivational Interviewing Relapse Prevention [place], MSW, [facility] 11/08/2018 2:16 PM

## 2018-11-08 NOTE — Progress Notes (Addendum)
Inpatient Diabetes Program Recommendations  AACE/ADA: New Consensus Statement on Inpatient Glycemic Control (2015)  Target Ranges:  Prepandial:   less than 140 mg/dL      Peak postprandial:   less than 180 mg/dL (1-2 hours)      Critically ill patients:  140 - 180 mg/dL   Results for Nancy [name], Nancy [name] (MRN [id number]) as of 11/08/2018 07:33  Ref. Range 11/06/2018 07:05 11/06/2018 12:17 11/06/2018 16:54 11/06/2018 20:16  Glucose-Capillary Latest Ref Range: 70 - 99 mg/dL 230 (H)  5 units NOVOLOG  252 (H)  8 units NOVOLOG  219 (H)  5 units NOVOLOG  239 (H)  2 units NOVOLOG +  47 units LANTUS   Results for Nancy [name], Nancy [name] (MRN [id number]) as of 11/08/2018 07:33  Ref. Range 11/07/2018 09:29 11/07/2018 12:36 11/07/2018 16:55 11/07/2018 20:50  Glucose-Capillary Latest Ref Range: 70 - 99 mg/dL 231 (H)  5 units NOVOLOG  303 (H)  11 units NOVOLOG  363 (H)  15 units NOVOLOG  318 (H)  4 units NOVOLOG    Results for Nancy [name], Nancy [name] (MRN [id number]) as of 11/08/2018 07:33  Ref. Range 11/08/2018 06:00  Glucose-Capillary Latest Ref Range: 70 - 99 mg/dL 296 (H)  8 units NOVOLOG    Results for Nancy [name], Nancy [name] (MRN [id number]) as of 11/08/2018 07:33  Ref. Range 12/19/2017 03:50 01/24/2018 18:19 07/13/2018 16:16 07/13/2018 16:16  Hemoglobin A1C Latest Ref Range: 4.0 - 5.6 % 10.8 (H) 12.0 (H) Pend 12.9 (A)     Admit with: MDD (major depressive disorder)  History: Diabetes (type 1), CHF  Home DM Meds: Tresiba 52 units Daily       Humalog 15 units BID  Current Orders: Lantus 49 units Daily      Novolog Moderate Correction Scale/ SSI (0-15 units) TID AC + HS    [facility] ([place] office).  Not sure if patient has made an appointment yet?    MD- Note patient was getting Lantus at bedtime.  Not sure why, however, dose was moved to Daytime.  Patient did NOT get any Lantus last PM.  Due for Lantus  49 units this AM.  Please also consider adding Novolog Meal Coverage for this patient:  Novolog 6 units TID with meals  (Please add the following Hold Parameters: Hold if pt eats <50% of meal, Hold if pt NPO)     --Will follow patient during hospitalization--  Wyn Quaker RN, MSN, CDE Diabetes Coordinator Inpatient Glycemic Control Team Team Pager: [phone] (8a-5p)

## 2018-11-08 NOTE — [facility] Suicide Risk Assessment (Signed)
[facility] INPATIENT:  Family/Significant Other Suicide Prevention Education  Suicide Prevention Education:  Patient Refusal for Family/Significant Other Suicide Prevention Education: The patient Nancy [name] has refused to provide written consent for family/significant other to be provided Family/Significant Other Suicide Prevention Education during admission and/or prior to discharge.  Physician notified.  SPE reviewed with patient.  Joellen Jersey 11/08/2018, 9:27 AM
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
## 2018-11-08 NOTE — Progress Notes (Signed)
Recreation Therapy Notes  Date:  11.2.20 Time: 0930 Location: 300 Hall Group Room  Group Topic: Stress Management  Goal Area(s) Addresses:  Patient will identify positive stress management techniques. Patient will identify benefits of using stress management post d/c.  Behavioral Response:  Engaged  Intervention: Stress Management  Activity :  Meditation.  LRT introduced the stress management technique of meditation.  LRT played a meditation that focused on making the most of your day.  Patients were to listen and follow along as meditation was played to fully engage in activity.  Education:  Stress Management, Discharge Planning.   Education Outcome: Acknowledges Education  Clinical Observations/Feedback:  Patient attended and participated in activity.    Victorino Sparrow, LRT/CTRS   Ria Comment, Ashby Moskal A 11/08/2018 11:22 AM

## 2018-11-08 NOTE — Progress Notes (Signed)
Patient shared in group that her day was "okay" overall due to the groups that were offered. Her goal for tomorrow is to be in a better mood.

## 2018-11-08 NOTE — Progress Notes (Signed)
D:  Patient's self inventory sheet, patient has fair sleep, sleep medication helpful.   Fair appetite, low energy level, poor concentration.  Rated depression 1, denied hopeless, anxiety 8.  Denied withdrawals.  Denied SI.  Physical problems, headaches, pain, worst pain #19,  back side, head.  Goal is better mood and energy.  Does have discharge plans. A:  Medications administered per MD orders.  Emotional support and encouragement given patient. R:  Denied SI and HI, contracts for safety.  Denied A/V hallucinations.  Safety maintained with 15 minute checks.

## 2018-11-08 NOTE — Progress Notes (Signed)
Patient ID: Nancy [name], female   DOB: Aug 17, 2000, 18 y.o.   MRN: [id number]  D: Patient has a flat affect but pleasant on approach. Reports continued depression but no active SI. Reports trouble sleeping. Was up at nurse's station a hour later asking for more sleep medication but medication just switched for the third time since admission so I encouraged her to sleep the best she could tonight and talk to the doctor about it.  A: Staff will monitor on q 15 minute checks, follow treatment plan, and give medications as ordered. R: Cooperative on the unit

## 2018-11-08 NOTE — Progress Notes (Signed)
Spiritual care group on grief and loss facilitated by chaplain Jerene Pitch MDiv, [facility]  Group Goal:  Support / Education around grief and loss Members engage in facilitated group support and psycho-social education.  Group Description:  Following introductions and group rules, group members engaged in facilitated group dialog and support around topic of loss, with particular support around experiences of loss in their lives. Group Identified types of loss (relationships / self / things) and identified patterns, circumstances, and changes that precipitate losses. Reflected on thoughts / feelings around loss, normalized grief responses, and recognized variety in grief experience.   Group noted Worden's four tasks of grief in discussion.  Group drew on Adlerian / Rogerian, narrative MI, Patient Progress:  Nancy [name] was present throughout group.  Attentive to group discussion as evidenced by head nodding during other group member's conversation.  Devoiry did not engage in group discussion.

## 2018-11-08 NOTE — Tx Team (Signed)
Interdisciplinary Treatment and Diagnostic Plan Update  11/08/2018 Time of Session: 9:00am Nancy [name] MRN: [id number]  Principal Diagnosis: <principal problem not specified>  Secondary Diagnoses: Active Problems:   Severe recurrent major depression without psychotic features (HCC)   Current Medications:  Current Facility-Administered Medications  Medication Dose Route Frequency Provider Last Rate Last Dose  . acetaminophen (TYLENOL) tablet 650 mg  650 mg Oral Q6H PRN Lindon Romp A, NP   650 mg at 11/08/18 0437  . amLODipine (NORVASC) tablet 5 mg  5 mg Oral Daily Lindon Romp A, NP   5 mg at 11/08/18 0847  . ARIPiprazole (ABILIFY) tablet 10 mg  10 mg Oral Daily Connye Burkitt, NP   10 mg at 11/08/18 0847  . atorvastatin (LIPITOR) tablet 20 mg  20 mg Oral q1800 Lindon Romp A, NP      . doxycycline (VIBRA-TABS) tablet 100 mg  100 mg Oral Q12H Lindon Romp A, NP   100 mg at 11/08/18 0850  . hydrOXYzine (ATARAX/VISTARIL) tablet 25 mg  25 mg Oral TID PRN Lindon Romp A, NP   25 mg at 11/08/18 0856  . insulin aspart (novoLOG) injection 0-15 Units  0-15 Units Subcutaneous TID WC Lindon Romp A, NP   8 Units at 11/08/18 0650  . insulin aspart (novoLOG) injection 0-5 Units  0-5 Units Subcutaneous QHS Lindon Romp A, NP   4 Units at 11/07/18 2139  . insulin glargine (LANTUS) injection 49 Units  49 Units Subcutaneous Daily Lindon Romp A, NP   49 Units at 11/08/18 0758  . lamoTRIgine (LAMICTAL) tablet 25 mg  25 mg Oral BID Lindon Romp A, NP   25 mg at 11/08/18 0848  . lubiprostone (AMITIZA) capsule 24 mcg  24 mcg Oral BID WC Lindon Romp A, NP      . metoCLOPramide (REGLAN) tablet 10 mg  10 mg Oral TID AC & HS Lindon Romp A, NP   10 mg at 11/08/18 0800  . multivitamin with minerals tablet 1 tablet  1 tablet Oral Daily Lindon Romp A, NP   1 tablet at 11/08/18 0846  . pantoprazole (PROTONIX) EC tablet 40 mg  40 mg Oral Daily Lindon Romp A, NP   40 mg at 11/08/18 0847  . propranolol  (INDERAL) tablet 10 mg  10 mg Oral TID Lindon Romp A, NP   10 mg at 11/08/18 0800  . topiramate (TOPAMAX) tablet 25 mg  25 mg Oral BID Lindon Romp A, NP   25 mg at 11/08/18 0847  . traZODone (DESYREL) tablet 300 mg  300 mg Oral QHS Lindon Romp A, NP       PTA Medications: Medications Prior to Admission  Medication Sig Dispense Refill Last Dose  . amLODipine (NORVASC) 5 MG tablet Take 1 tablet (5 mg total) by mouth daily. 30 tablet 0   . ARIPiprazole (ABILIFY) 20 MG tablet Take 20 mg by mouth daily.     Marland Kitchen atorvastatin (LIPITOR) 20 MG tablet Take 1 tablet (20 mg total) by mouth daily at 6 PM. 30 tablet 1   . FLUoxetine (PROZAC) 40 MG capsule Take 1 capsule (40 mg total) by mouth at bedtime. 30 capsule 1   . hydrOXYzine (ATARAX/VISTARIL) 25 MG tablet Take 1 tablet (25 mg total) by mouth 3 (three) times daily as needed for anxiety. 60 tablet 1   . Insulin Degludec (TRESIBA) 100 UNIT/ML SOLN Inject 52 Units into the skin daily. 10 mL 3   . insulin lispro (HUMALOG) 100 UNIT/ML  injection Inject 0.15 mLs (15 Units total) into the skin 2 (two) times daily with a meal. 10 mL 6   . lamoTRIgine (LAMICTAL) 25 MG tablet Take 1 tablet (25 mg total) by mouth 2 (two) times daily. 60 tablet 1   . lubiprostone (AMITIZA) 24 MCG capsule Take 1 capsule (24 mcg total) by mouth 2 (two) times daily with a meal. 60 capsule 6   . metoCLOPramide (REGLAN) 10 MG tablet Take 1 tablet (10 mg total) by mouth 4 (four) times daily -  before meals and at bedtime. 120 tablet 0   . Multiple Vitamin (MULTIVITAMIN WITH MINERALS) TABS tablet Take 1 tablet by mouth daily. 30 tablet 1   . pantoprazole (PROTONIX) 40 MG tablet Take 1 tablet (40 mg total) by mouth daily. 30 tablet 0   . promethazine (PHENERGAN) 25 MG suppository Place 1 suppository (25 mg total) rectally every 6 (six) hours as needed for nausea or vomiting. 12 each 0   . propranolol (INDERAL) 10 MG tablet Take 1 tablet (10 mg total) by mouth 3 (three) times daily. 90  tablet 0   . topiramate (TOPAMAX) 25 MG tablet Take 1 tablet (25 mg total) by mouth 2 (two) times daily. 60 tablet 1   . traZODone (DESYREL) 100 MG tablet Take 3 tablets (300 mg total) by mouth at bedtime. 90 tablet 2     Patient Stressors:    Patient Strengths:    Treatment Modalities: Medication Management, Group therapy, Case management,  1 to 1 session with clinician, Psychoeducation, Recreational therapy.   Physician Treatment Plan for Primary Diagnosis: <principal problem not specified> Long Term Goal(s):     Short Term Goals:    Medication Management: Evaluate patient's response, side effects, and tolerance of medication regimen.  Therapeutic Interventions: 1 to 1 sessions, Unit Group sessions and Medication administration.  Evaluation of Outcomes: Not Met  Physician Treatment Plan for Secondary Diagnosis: Active Problems:   Severe recurrent major depression without psychotic features ([place])  Long Term Goal(s):     Short Term Goals:       Medication Management: Evaluate patient's response, side effects, and tolerance of medication regimen.  Therapeutic Interventions: 1 to 1 sessions, Unit Group sessions and Medication administration.  Evaluation of Outcomes: Not Met   RN Treatment Plan for Primary Diagnosis: <principal problem not specified> Long Term Goal(s): Knowledge of disease and therapeutic regimen to maintain health will improve  Short Term Goals: Ability to participate in decision making will improve, Ability to disclose and discuss suicidal ideas, Ability to identify and develop effective coping behaviors will improve and Compliance with prescribed medications will improve  Medication Management: RN will administer medications as ordered by provider, will assess and evaluate patient's response and provide education to patient for prescribed medication. RN will report any adverse and/or side effects to prescribing provider.  Therapeutic Interventions: 1 on 1  counseling sessions, Psychoeducation, Medication administration, Evaluate responses to treatment, Monitor vital signs and CBGs as ordered, Perform/monitor CIWA, COWS, AIMS and Fall Risk screenings as ordered, Perform wound care treatments as ordered.  Evaluation of Outcomes: Not Met   LCSW Treatment Plan for Primary Diagnosis: <principal problem not specified> Long Term Goal(s): Safe transition to appropriate next level of care at discharge, Engage patient in therapeutic group addressing interpersonal concerns.  Short Term Goals: Engage patient in aftercare planning with referrals and resources  Therapeutic Interventions: Assess for all discharge needs, 1 to 1 time with Social worker, Explore available resources and support systems, Assess  for adequacy in community support network, Educate family and significant other(s) on suicide prevention, Complete Psychosocial Assessment, Interpersonal group therapy.  Evaluation of Outcomes: Not Met   Progress in Treatment: Attending groups: No. New to unit  Participating in groups: No.  Taking medication as prescribed: Yes. Toleration medication: Yes. Family/Significant other contact made: No, will contact:  if patient consents to collateral contacts Patient understands diagnosis: Yes. Discussing patient identified problems/goals with staff: Yes. Medical problems stabilized or resolved: Yes. Denies suicidal/homicidal ideation: Yes. Issues/concerns per patient self-inventory: No. Other:   New problem(s) identified: None   New Short Term/Long Term Goal(s): medication stabilization, elimination of SI thoughts, development of comprehensive mental wellness plan.    Patient Goals:  "To feel less depressed and manage my mood and energy"   Discharge Plan or Barriers: Patient recently admitted. CSW will continue to follow and assess for appropriate referrals and possible discharge planning.    Reason for Continuation of Hospitalization:  Anxiety Depression Medication stabilization Suicidal ideation  Estimated Length of Stay: 3-5 days   Attendees: Patient: Nancy [name]  11/08/2018 9:16 AM  Physician: Dr. Neita Garnet, MD 11/08/2018 9:16 AM  Nursing: Rise Paganini.Raliegh Ip, RN 11/08/2018 9:16 AM  RN Care Manager: 11/08/2018 9:16 AM  Social Worker: Radonna Ricker, LCSW 11/08/2018 9:16 AM  Recreational Therapist:  11/08/2018 9:16 AM  Other: Harriett Sine, NP 11/08/2018 9:16 AM  Other:  11/08/2018 9:16 AM  Other: 11/08/2018 9:16 AM    Scribe for Treatment Team: Marylee Floras, [place] 11/08/2018 9:16 AM

## 2018-11-08 NOTE — Progress Notes (Signed)
Patient ID: Nancy [name], female   DOB: 2000-09-22, 18 y.o.   MRN: [id number] DAR Note: Patient cooperative with care, visible in the day room at staff of shift interacting with her peers in the day room, and denied SI/HI/AVH. Affect blunted, mood depressed, pt asked for, was given Seroquel 50mg  as one time dose for insomnia, and refused the Trazodone for sleep, stating it does not work.Pt also given all of her other meds, and is currently in bed sleeping with no signs of distress, and is being monitored Q15 minutes.
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
## 2018-11-09 LAB — GLUCOSE, CAPILLARY
Glucose-Capillary: 355 mg/dL — ABNORMAL HIGH (ref 70–99)
Glucose-Capillary: 146 mg/dL — ABNORMAL HIGH (ref 70–99)
Glucose-Capillary: 209 mg/dL — ABNORMAL HIGH (ref 70–99)
Glucose-Capillary: 298 mg/dL — ABNORMAL HIGH (ref 70–99)

## 2018-11-09 MED ORDER — INSULIN ASPART 100 UNIT/ML ~~LOC~~ SOLN
10.0000 [IU] | Freq: Three times a day (TID) | SUBCUTANEOUS | Status: DC
  Administered 2018-11-09 – 2018-11-11 (×6): 10 [IU] via SUBCUTANEOUS

## 2018-11-09 MED ORDER — QUETIAPINE FUMARATE 50 MG PO TABS
50.0000 mg | ORAL_TABLET | Freq: Every day | ORAL | Status: DC
  Administered 2018-11-09: 50 mg via ORAL
  Filled 2018-11-09 (×2): qty 1

## 2018-11-09 MED ORDER — QUETIAPINE FUMARATE 25 MG PO TABS
25.0000 mg | ORAL_TABLET | Freq: Two times a day (BID) | ORAL | Status: DC
  Administered 2018-11-09 – 2018-11-11 (×5): 25 mg via ORAL
  Filled 2018-11-09 (×11): qty 1

## 2018-11-09 MED ORDER — INSULIN GLARGINE 100 UNIT/ML ~~LOC~~ SOLN
55.0000 [IU] | Freq: Every day | SUBCUTANEOUS | Status: DC
  Administered 2018-11-09 – 2018-11-10 (×2): 55 [IU] via SUBCUTANEOUS

## 2018-11-09 NOTE — Progress Notes (Signed)
Recreation Therapy Notes  Animal-Assisted Activity (AAA) Program Checklist/Progress Notes Patient Eligibility Criteria Checklist & Daily Group note for Rec Tx Intervention  Date: 11.3.20 Time: 22 Location: 300 Hall Group Room   AAA/T Program Assumption of Risk Form signed by Patient/ or Parent Legal Guardian  YES   Patient is free of allergies or sever asthma YES   Patient reports no fear of animals  YES   Patient reports no history of cruelty to animals  YES   Patient understands his/her participation is voluntary  YES   Patient washes hands before animal contact  YES   Patient washes hands after animal contact  YES   Behavioral Response: Engaged  Education: Contractor, Appropriate Animal Interaction   Education Outcome: Acknowledges understanding/In group clarification offered/Needs additional education.   Clinical Observations/Feedback: Pt attended and participated in activity.    Victorino Sparrow, LRT/CTRS         Victorino Sparrow A 11/09/2018 3:31 PM

## 2018-11-09 NOTE — Progress Notes (Signed)
D:  Patient's self inventory sheet, patient has poor sleep, sleep medication not helpful.  Poor appetite, low energy level, poor concentration.  Rated depression 9, denied hopeless, anxiety 10.  Denied withdrawals.  Denied SI.  Physical problems, headaches.  Physical pain, back, side, head, worst pain #6 in past 24 hours.  Goal is working on my depression and mood.  Plans to make the best today.  No discharge plans.   A:  Medications administered per MD orders.  Emotional support and encouragment given patient. R:  Denied SI and HI, contracts for safety.  Denied A/V hallucinations.  Safety maintained with 15 minute checks.

## 2018-11-09 NOTE — Progress Notes (Signed)
Inpatient Diabetes Program Recommendations  AACE/ADA: New Consensus Statement on Inpatient Glycemic Control (2015)  Target Ranges:  Prepandial:   less than 140 mg/dL      Peak postprandial:   less than 180 mg/dL (1-2 hours)      Critically ill patients:  140 - 180 mg/dL   Results for Nancy [name], Nancy [name] (MRN [id number]) as of 11/09/2018 07:05  Ref. Range 11/08/2018 06:00 11/08/2018 11:50 11/08/2018 17:01 11/08/2018 20:40  Glucose-Capillary Latest Ref Range: 70 - 99 mg/dL 296 (H)  8 units NOVOLOG +  49 units LANTUS given at 8am  209 (H)  11 units NOVOLOG  232 (H)  11 units NOVOLOG  228 (H)  2 units NOVOLOG    Results for Nancy [name], Nancy [name] (MRN [id number]) as of 11/09/2018 07:05  Ref. Range 11/09/2018 06:08  Glucose-Capillary Latest Ref Range: 70 - 99 mg/dL 355 (H)  21 units NOVOLOG    Results for Nancy [name], Nancy [name] (MRN [id number]) as of 11/09/2018 07:05  Ref. Range 07/13/2018 16:16 11/07/2018 18:04  Hemoglobin A1C Latest Ref Range: 4.8 - 5.6 % 12.9 (A) 10.2 (H)    Admit with: MDD (major depressive disorder)  History: Diabetes (type 1), CHF  Home DM Meds: Tresiba 52 units Daily                             Humalog 15 units BID  Current Orders: Lantus 49 units Daily                            Novolog Moderate Correction Scale/ SSI (0-15 units) TID AC + HS      Novolog 6 units TID with meals    [facility] ([place] office).  Not sure if patient has made an appointment yet?    MD- Please consider the following in-hospital insulin adjustments:  1. Increase Lantus to 55 units Daily (if dose already given this AM, please place order for Lantus 6 units X 1 dose to be given this AM as well)   2. Increase Novolog Meal Coverage to: Novolog 10 units TID with meals  (Please add the following Hold Parameters: Hold if pt eats <50% of meal, Hold if pt NPO)      --Will follow patient during  hospitalization--  Wyn Quaker RN, MSN, CDE Diabetes Coordinator Inpatient Glycemic Control Team Team Pager: [phone] (8a-5p)

## 2018-11-09 NOTE — Progress Notes (Addendum)
[facility] MD Progress Note  11/09/2018 11:10 AM Nancy [name]  MRN:  [id number] Subjective:  "I want to punch everyone in the face."  Ms. Mcenroe found sitting in the dayroom.  She has been active in unit milieu and participating in groups. She appears calm but reports increased irritability this morning related to lack of sleep overnight. She states she was up multiple times during the night with difficulty falling back asleep. 5 hours of sleep recorded. She is requesting medication for agitation. She is agreeable to discontinue Abilify and start Seroquel. She reports one-time dose of Seroquel was helpful for sleep two nights ago, and also reports she has taken this medication in the past with improved sleep. She denies SI/HI/AVH.  CBGs remain elevated with 228 at HS and 355 this morning. Diabetes coordinator's recommendations reviewed- increase Lantus to 55 units/day and meal coverage Novolog to 10 units TID with meals.  From admission H&P: Patient is 18 year old female, lives with mother. Presented to ED on 10/24 reporting seizures , with a CBG of 493. ED notes indicate possiblepseudoseizures/ conversion disorder. Alsoreported worsening depression with recent suicidal ideations of drinking rubbing alcohol or overdosing and reports thatshe had recently overdosed on Trazodone about 2 weeks prior,but that" nothing happened".  Principal Problem: <principal problem not specified> Diagnosis: Active Problems:   Severe recurrent major depression without psychotic features ([place])  Total Time spent with patient: 15 minutes  Past Psychiatric History: See admission H&P  Past Medical History:  Past Medical History:  Diagnosis Date  . Acanthosis nigricans   . Anxiety   . CHF (congestive heart failure) ([place])   . Chronic lower back pain   . Depression   . DKA, type 1 ([place]) 09/13/2018  . Dyspepsia   . Obesity   . Ovarian cyst    pt is not aware of this hx (11/24/2017)  . Pre-diabetes   . 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 Premature baby   . Seizures ([place])   . Type II diabetes mellitus ([facility])    insulin dependant    Past Surgical History:  Procedure Laterality Date  . ABDOMINAL HERNIA REPAIR     "I was a baby"  . BIOPSY  10/12/2018   Procedure: BIOPSY;  Surgeon: Jackquline Denmark, MD;  Location: [facility] ENDOSCOPY;  Service: Endoscopy;;  . ESOPHAGOGASTRODUODENOSCOPY (EGD) WITH PROPOFOL N/A 10/12/2018   Procedure: ESOPHAGOGASTRODUODENOSCOPY (EGD) WITH PROPOFOL;  Surgeon: Jackquline Denmark, MD;  Location: [facility] ENDOSCOPY;  Service: Endoscopy;  Laterality: N/A;  . HERNIA REPAIR    . LEFT HEART CATH AND CORONARY ANGIOGRAPHY N/A 10/13/2018   Procedure: LEFT HEART CATH AND CORONARY ANGIOGRAPHY;  Surgeon: Burnell Blanks, MD;  Location: [place] [facility] LAB;  Service: Cardiovascular;  Laterality: N/A;  . TONSILLECTOMY AND ADENOIDECTOMY    . WISDOM TOOTH EXTRACTION  2017   Family History:  Family History  Problem Relation Age of Onset  . Diabetes Mother   . Hypertension Mother   . Obesity Mother   . Asthma Mother   . Allergic rhinitis Mother   . Eczema Mother   . Cervical cancer Mother   . Diabetes Father   . Hypertension Father   . Obesity Father   . Hyperlipidemia Father   . Hypertension Paternal Aunt   . Hypertension Maternal Grandfather   . Colon cancer Maternal Grandfather   . Diabetes Paternal Grandmother   . Obesity Paternal Grandmother   . Diabetes Paternal Grandfather   . Obesity Paternal Grandfather   . Angioedema  Neg Hx   . Immunodeficiency Neg Hx   . Urticaria Neg Hx   . Stomach cancer Neg Hx   . Esophageal cancer Neg Hx    Family Psychiatric  History: See admission H&P Social History:  Social History   Substance and Sexual Activity  Alcohol Use No  . Alcohol/week: 0.0 standard drinks     Social History   Substance and Sexual Activity  Drug Use No    Social History   Socioeconomic History  . Marital status: Single    Spouse name: Not on file  . Number of  children: 0  . Years of education: Not on file  . Highest education level: Not on file  Occupational History  . Occupation: Admission  Social Needs  . Financial resource strain: Not on file  . Food insecurity    Worry: Not on file    Inability: Not on file  . Transportation needs    Medical: Not on file    Non-medical: Not on file  Tobacco Use  . Smoking status: Never Smoker  . Smokeless tobacco: Never Used  Substance and Sexual Activity  . Alcohol use: No    Alcohol/week: 0.0 standard drinks  . Drug use: No  . Sexual activity: Never  Lifestyle  . Physical activity    Days per week: Not on file    Minutes per session: Not on file  . Stress: Not on file  Relationships  . Social Musicianconnections    Talks on phone: Not on file    Gets together: Not on file    Attends religious service: Not on file    Active member of club or organization: Not on file    Attends meetings of clubs or organizations: Not on file    Relationship status: Not on file  Other Topics Concern  . Not on file  Social History Narrative   Lives with mom and mom's girlfriend.   No EtOH, tobacco, Drugs   Additional Social History:                         Sleep: Poor  Appetite:  Fair  Current Medications: Current Facility-Administered Medications  Medication Dose Route Frequency Provider Last Rate Last Dose  . acetaminophen (TYLENOL) tablet 650 mg  650 mg Oral Q6H PRN Nira ConnBerry, Jason A, NP   650 mg at 11/09/18 0319  . amLODipine (NORVASC) tablet 5 mg  5 mg Oral Daily Nira ConnBerry, Jason A, NP   5 mg at 11/09/18 0827  . atorvastatin (LIPITOR) tablet 20 mg  20 mg Oral q1800 Nira ConnBerry, Jason A, NP   20 mg at 11/08/18 1743  . doxycycline (VIBRA-TABS) tablet 100 mg  100 mg Oral Q12H Nira ConnBerry, Jason A, NP   100 mg at 11/09/18 [id number]  . hydrOXYzine (ATARAX/VISTARIL) tablet 25 mg  25 mg Oral TID PRN Jackelyn PolingBerry, Jason A, NP   25 mg at 11/08/18 2105  . insulin aspart (novoLOG) injection 0-15 Units  0-15 Units Subcutaneous TID  WC Nira ConnBerry, Jason A, NP   15 Units at 11/09/18 [phone]  . insulin aspart (novoLOG) injection 0-5 Units  0-5 Units Subcutaneous QHS Nira ConnBerry, Jason A, NP   2 Units at 11/08/18 2104  . insulin aspart (novoLOG) injection 10 Units  10 Units Subcutaneous TID WC Aldean BakerSykes, Hiral Lukasiewicz E, NP      . insulin glargine (LANTUS) injection 55 Units  55 Units Subcutaneous Daily Aldean BakerSykes, Silvino Selman E, NP   55 Units at 11/09/18  0830  . lamoTRIgine (LAMICTAL) tablet 25 mg  25 mg Oral BID Nira Conn A, NP   25 mg at 11/09/18 0827  . lubiprostone (AMITIZA) capsule 24 mcg  24 mcg Oral BID WC Nira Conn A, NP   24 mcg at 11/09/18 0828  . metoCLOPramide (REGLAN) tablet 10 mg  10 mg Oral TID AC & HS Nira Conn A, NP   10 mg at 11/09/18 0618  . mirtazapine (REMERON) tablet 7.5 mg  7.5 mg Oral QHS Aldean Baker, NP   7.5 mg at 11/08/18 2104  . multivitamin with minerals tablet 1 tablet  1 tablet Oral Daily Nira Conn A, NP   1 tablet at 11/09/18 0827  . pantoprazole (PROTONIX) EC tablet 40 mg  40 mg Oral Daily Nira Conn A, NP   40 mg at 11/09/18 0828  . propranolol (INDERAL) tablet 10 mg  10 mg Oral TID Nira Conn A, NP   10 mg at 11/09/18 0834  . QUEtiapine (SEROQUEL) tablet 25 mg  25 mg Oral BID Aldean Baker, NP   25 mg at 11/09/18 1005  . QUEtiapine (SEROQUEL) tablet 50 mg  50 mg Oral QHS Aldean Baker, NP      . topiramate (TOPAMAX) tablet 25 mg  25 mg Oral BID Nira Conn A, NP   25 mg at 11/09/18 8453    Lab Results:  Results for orders placed or performed during the hospital encounter of 11/07/18 (from the past 48 hour(s))  Glucose, capillary     Status: Abnormal   Collection Time: 11/07/18  8:50 PM  Result Value Ref Range   Glucose-Capillary 318 (H) 70 - 99 mg/dL   Comment 1 Notify RN    Comment 2 Document in Chart   Glucose, capillary     Status: Abnormal   Collection Time: 11/08/18  6:00 AM  Result Value Ref Range   Glucose-Capillary 296 (H) 70 - 99 mg/dL   Comment 1 Notify RN    Comment 2 Document in Chart    Glucose, capillary     Status: Abnormal   Collection Time: 11/08/18 11:50 AM  Result Value Ref Range   Glucose-Capillary 209 (H) 70 - 99 mg/dL  Glucose, capillary     Status: Abnormal   Collection Time: 11/08/18  5:01 PM  Result Value Ref Range   Glucose-Capillary 232 (H) 70 - 99 mg/dL   Comment 1 Notify RN   Glucose, capillary     Status: Abnormal   Collection Time: 11/08/18  8:40 PM  Result Value Ref Range   Glucose-Capillary 228 (H) 70 - 99 mg/dL   Comment 1 Notify RN   Glucose, capillary     Status: Abnormal   Collection Time: 11/09/18  6:08 AM  Result Value Ref Range   Glucose-Capillary 355 (H) 70 - 99 mg/dL    Blood Alcohol level:  Lab Results  Component Value Date   ETH <10 11/03/2018   ETH <10 09/12/2018    Metabolic Disorder Labs: Lab Results  Component Value Date   HGBA1C 10.2 (H) 11/07/2018   MPG 246.04 11/07/2018   MPG 297.7 01/24/2018   No results found for: PROLACTIN Lab Results  Component Value Date   CHOL 95 11/07/2018   TRIG 86 11/07/2018   HDL 52 11/07/2018   CHOLHDL 1.8 11/07/2018   VLDL 17 11/07/2018   LDLCALC 26 11/07/2018   LDLCALC 72 10/05/2018    Physical Findings: AIMS: Facial and Oral Movements Muscles of Facial Expression:  None, normal Lips and Perioral Area: None, normal Jaw: None, normal Tongue: None, normal,Extremity Movements Upper (arms, wrists, hands, fingers): None, normal Lower (legs, knees, ankles, toes): None, normal, Trunk Movements Neck, shoulders, hips: None, normal, Overall Severity Severity of abnormal movements (highest score from questions above): None, normal Incapacitation due to abnormal movements: None, normal Patient's awareness of abnormal movements (rate only patient's report): No Awareness, Dental Status Current problems with teeth and/or dentures?: No Does patient usually wear dentures?: No  CIWA:  CIWA-Ar Total: 1 COWS:  COWS Total Score: 5  Musculoskeletal: Strength & Muscle Tone: within normal  limits Gait & Station: normal Patient leans: N/A  Psychiatric Specialty Exam: Physical Exam  Nursing note and vitals reviewed. Constitutional: She is oriented to person, place, and time. She appears well-developed and well-nourished.  Cardiovascular: Normal rate.  Respiratory: Effort normal.  Neurological: She is alert and oriented to person, place, and time.    Review of Systems  Constitutional: Negative.   Respiratory: Negative for cough and shortness of breath.   Cardiovascular: Negative for chest pain.  Psychiatric/Behavioral: Positive for depression. Negative for hallucinations, substance abuse and suicidal ideas. The patient is nervous/anxious and has insomnia.     Blood pressure 105/70, pulse (!) 129, temperature 98.8 F (37.1 C), temperature source Oral, last menstrual period 10/19/2018.There is no height or weight on file to calculate BMI.  General Appearance: Casual  Eye Contact:  Good  Speech:  Normal Rate  Volume:  Normal  Mood:  Irritable  Affect:  Non-Congruent  Thought Process:  Coherent  Orientation:  Full (Time, Place, and Person)  Thought Content:  Logical  Suicidal Thoughts:  No  Homicidal Thoughts:  No  Memory:  Immediate;   Good Recent;   Good  Judgement:  Fair  Insight:  Fair  Psychomotor Activity:  Normal  Concentration:  Concentration: Good and Attention Span: Fair  Recall:  Good  Fund of Knowledge:  Fair  Language:  Good  Akathisia:  No  Handed:  Right  AIMS (if indicated):     Assets:  Communication Skills Desire for Improvement Housing Resilience  ADL's:  Intact  Cognition:  WNL  Sleep:  Number of Hours: 5     Treatment Plan Summary: Daily contact with patient to assess and evaluate symptoms and progress in treatment and Medication management   Continue inpatient hospitalization.  Continue inpatient hospitalization.  Discontinue Abilify Start Seroquel 50 mg PO QHS, 25 mg PO BID for agitation/sleep Continue Remeron 7.5 mg PO QHS  for mood/sleep Continue Lamictal 25 mg PO BID for mood Increase Novolog to 10 units TID with meals for diabetes, hold if eating <50% of meal Continue sliding scale Novolog TID with meals for diabetes Increase Lantus to 55 units SQ daily for diabetes Continue doxycycline 100 mg PO BID for abscess Continue Norvasc 5 mg PO daily for HTN Continue Lipitor 20 mg PO daily for HLD Continue Vistaril 25 mg PO TID PRN anxiety Continue Amitiza 24 mcg PO daily for IBS Continue Reglan 7.5 mg PO TID with meals for IBS Continue Protonix 40 mg PO daily for GERD Continue propanolol 10 mg PO TID for tachycardia Continue Topamax 25 mg PO BID for migraines  Patient will participate in the therapeutic group milieu.  Discharge disposition in progress.   Aldean Baker, NP 11/09/2018, 11:10 AM

## 2018-11-10 LAB — GLUCOSE, CAPILLARY
Glucose-Capillary: 242 mg/dL — ABNORMAL HIGH (ref 70–99)
Glucose-Capillary: 240 mg/dL — ABNORMAL HIGH (ref 70–99)
Glucose-Capillary: 124 mg/dL — ABNORMAL HIGH (ref 70–99)
Glucose-Capillary: 193 mg/dL — ABNORMAL HIGH (ref 70–99)
Glucose-Capillary: 214 mg/dL — ABNORMAL HIGH (ref 70–99)

## 2018-11-10 MED ORDER — INSULIN GLARGINE 100 UNIT/ML ~~LOC~~ SOLN
45.0000 [IU] | Freq: Every day | SUBCUTANEOUS | Status: DC
  Administered 2018-11-10: 21:00:00 45 [IU] via SUBCUTANEOUS

## 2018-11-10 MED ORDER — PROPRANOLOL HCL 20 MG PO TABS
20.0000 mg | ORAL_TABLET | Freq: Three times a day (TID) | ORAL | Status: DC
  Administered 2018-11-10 – 2018-11-11 (×4): 20 mg via ORAL
  Filled 2018-11-10 (×9): qty 1

## 2018-11-10 MED ORDER — INSULIN GLARGINE 100 UNIT/ML ~~LOC~~ SOLN: 60.0000 [IU] | Freq: Every day | SUBCUTANEOUS | Status: DC

## 2018-11-10 MED ORDER — LAMOTRIGINE 25 MG PO TABS
25.0000 mg | ORAL_TABLET | ORAL | Status: AC
  Administered 2018-11-10: 11:00:00 25 mg via ORAL
  Filled 2018-11-10: qty 1

## 2018-11-10 MED ORDER — HYDROXYZINE HCL 50 MG PO TABS
50.0000 mg | ORAL_TABLET | Freq: Three times a day (TID) | ORAL | Status: DC | PRN
  Administered 2018-11-10 – 2018-11-11 (×2): 50 mg via ORAL
  Filled 2018-11-10 (×2): qty 1

## 2018-11-10 MED ORDER — LAMOTRIGINE 25 MG PO TABS
50.0000 mg | ORAL_TABLET | Freq: Two times a day (BID) | ORAL | Status: DC
  Administered 2018-11-10 – 2018-11-11 (×2): 50 mg via ORAL
  Filled 2018-11-10 (×6): qty 2

## 2018-11-10 MED ORDER — PANTOPRAZOLE SODIUM 40 MG PO TBEC
40.0000 mg | DELAYED_RELEASE_TABLET | Freq: Two times a day (BID) | ORAL | Status: DC
  Administered 2018-11-10 – 2018-11-11 (×2): 40 mg via ORAL
  Filled 2018-11-10 (×6): qty 1

## 2018-11-10 MED ORDER — QUETIAPINE FUMARATE 100 MG PO TABS
100.0000 mg | ORAL_TABLET | Freq: Every day | ORAL | Status: DC
  Administered 2018-11-10: 21:00:00 100 mg via ORAL
  Filled 2018-11-10 (×3): qty 1

## 2018-11-10 NOTE — Progress Notes (Signed)
Recreation Therapy Notes  Date:  11.4.20 Time: 0930 Location: [address] Group Room  Group Topic: Stress Management  Goal Area(s) Addresses:  Patient will identify positive stress management techniques. Patient will identify benefits of using stress management post d/c.  Behavioral Response: Engaged  Intervention: Stress Management  Activity :  Guided Imagery.  LRT introduced the stress management technique of guided imagery.  LRT read a script that took patients on a mental vacation to the beach.  Patients were to listen as LRT read the script to engage in activity.  Education:  Stress Management, Discharge Planning.   Education Outcome: Acknowledges Education  Clinical Observations/Feedback:  Pt attended and participated in activity.    Victorino Sparrow, LRT/CTRS         Victorino Sparrow A 11/10/2018 12:16 PM

## 2018-11-10 NOTE — Progress Notes (Signed)
   11/09/18 2200  Psych Admission Type (Psych Patients Only)  Admission Status Voluntary  Psychosocial Assessment  Patient Complaints Anxiety;Insomnia  Eye Contact Brief  Facial Expression Animated;Anxious  Affect Anxious  Speech Logical/coherent  Interaction Assertive;Attention-seeking;Needy  Motor Activity Fidgety  Appearance/Hygiene Unremarkable  Behavior Characteristics Anxious  Mood Anxious  Thought Process  Coherency WDL  Content WDL  Delusions WDL  Perception WDL  Hallucination None reported or observed  Judgment WDL  Confusion None  Danger to Self  Current suicidal ideation? Denies  Danger to Others  Danger to Others None reported or observed

## 2018-11-10 NOTE — Plan of Care (Signed)
  Problem: Education: Goal: Utilization of techniques to improve thought processes will improve Outcome: Progressing   

## 2018-11-10 NOTE — Progress Notes (Signed)
[facility]  11/10/2018 11:04 AM Nancy [name]  MRN:  [id number] Subjective: Patient is a 18 year old female admitted on 11/07/2018 [facility] secondary to depression and suicidal ideation.  Objective: Patient is seen and examined.  Patient is a 18 year old female with the above-stated past psychiatric history who is seen in follow-up.  She states she is doing a little bit better.  She stated that her suicidal ideation has resolved.  She stated her mood is doing a bit better.  We discussed one of the major issues which really has to do with her irritability.  She discussed an issue that it occurred on the unit earlier today which irritated her.  She is on Lamictal for seizures, and she was unaware of the fact that it can be used for mood stability.  During the course the hospitalization that had been reduced to 25 twice a day, and we discussed increasing it back to 50 mg p.o. twice daily.  She had also been on Abilify, but was not sleeping well, and was switched to Seroquel.  She is still not sleeping great and got 5.25 hours of sleep last night.  She had also been placed on mirtazapine 7.5 mg p.o. nightly last night.  We discussed increasing her Seroquel as well at bedtime.  Her blood sugar continues to be a problem.  Her hemoglobin A1c on admission was 10.2.  She stated that previously it had been as high as 14.  This morning it was 240.  We discussed increasing her Lantus insulin.  Review of her laboratories on admission showed a mildly low sodium at 133, her blood sugar on admission was 249.  Her creatinine is stable at 0.51.  CBC was normal, TSH was normal.  As mentioned above, her hemoglobin A1c on 11/1 was 10.2.  She is still tachycardic with a rate of 122 this morning.  Her blood pressure stable at 113/81.  She is afebrile.  Principal Problem: <principal problem not specified> Diagnosis: Active Problems:   Severe recurrent major depression without psychotic  features (HCC)  Total Time spent with patient: 20 minutes  Past Psychiatric History: See admission H&P  Past Medical History:  Past Medical History:  Diagnosis Date  . Acanthosis nigricans   . Anxiety   . CHF (congestive heart failure) (HCC)   . Chronic lower back pain   . Depression   . DKA, type 1 (HCC) 09/13/2018  . Dyspepsia   . Obesity   . Ovarian cyst    pt is not aware of this hx (11/24/2017)  . Pre-diabetes   . 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 Premature baby   . Seizures (HCC)   . Type II diabetes mellitus (HCC)    insulin dependant    Past Surgical History:  Procedure Laterality Date  . ABDOMINAL HERNIA REPAIR     "I was a baby"  . BIOPSY  10/12/2018   Procedure: BIOPSY;  Surgeon: Lynann BolognaGupta, Rajesh, MD;  [facility];  Service: Endoscopy;;  . ESOPHAGOGASTRODUODENOSCOPY (EGD) WITH PROPOFOL N/A 10/12/2018   Procedure: ESOPHAGOGASTRODUODENOSCOPY (EGD) WITH PROPOFOL;  Surgeon: Lynann BolognaGupta, Rajesh, MD;  [facility];  Service: Endoscopy;  Laterality: N/A;  . HERNIA REPAIR    . LEFT HEART CATH AND CORONARY ANGIOGRAPHY N/A 10/13/2018   Procedure: LEFT HEART CATH AND CORONARY ANGIOGRAPHY;  Surgeon: Kathleene HazelMcAlhany, Christopher D, MD;  Location: [facility] INVASIVE CV LAB;  Service: Cardiovascular;  Laterality: N/A;  . TONSILLECTOMY AND ADENOIDECTOMY    .  WISDOM TOOTH EXTRACTION  2017   Family History:  Family History  Problem Relation Age of Onset  . Diabetes Mother   . Hypertension Mother   . Obesity Mother   . Asthma Mother   . Allergic rhinitis Mother   . Eczema Mother   . Cervical cancer Mother   . Diabetes Father   . Hypertension Father   . Obesity Father   . Hyperlipidemia Father   . Hypertension Paternal Aunt   . Hypertension Maternal Grandfather   . Colon cancer Maternal Grandfather   . Diabetes Paternal Grandmother   . Obesity Paternal Grandmother   . Diabetes Paternal Grandfather   . Obesity Paternal Grandfather   . Angioedema Neg Hx   . Immunodeficiency  Neg Hx   . Urticaria Neg Hx   . Stomach cancer Neg Hx   . Esophageal cancer Neg Hx    Family Psychiatric  History: See admission H&P Social History:  Social History   Substance and Sexual Activity  Alcohol Use No  . Alcohol/week: 0.0 standard drinks     Social History   Substance and Sexual Activity  Drug Use No    Social History   Socioeconomic History  . Marital status: Single    Spouse name: Not on file  . Number of children: 0  . Years of education: Not on file  . Highest education level: Not on file  Occupational History  . Occupation: Admission  Social Needs  . Financial resource strain: Not on file  . Food insecurity    Worry: Not on file    Inability: Not on file  . Transportation needs    Medical: Not on file    Non-medical: Not on file  Tobacco Use  . Smoking status: Never Smoker  . Smokeless tobacco: Never Used  Substance and Sexual Activity  . Alcohol use: No    Alcohol/week: 0.0 standard drinks  . Drug use: No  . Sexual activity: Never  Lifestyle  . Physical activity    Days per week: Not on file    Minutes per session: Not on file  . Stress: Not on file  Relationships  . Social Musician on phone: Not on file    Gets together: Not on file    Attends religious service: Not on file    Active member of club or organization: Not on file    Attends meetings of clubs or organizations: Not on file    Relationship status: Not on file  Other Topics Concern  . Not on file  Social History Narrative   Lives with mom and mom's girlfriend.   No EtOH, tobacco, Drugs   Additional Social History:                         Sleep: Fair  Appetite:  Fair  Current Medications: Current Facility-Administered Medications  Medication Dose Route Frequency Provider Last Rate Last Dose  . acetaminophen (TYLENOL) tablet 650 mg  650 mg Oral Q6H PRN Nira Conn A, NP   650 mg at 11/10/18 8333  . amLODipine (NORVASC) tablet 5 mg  5 mg Oral  Daily Nira Conn A, NP   5 mg at 11/10/18 0819  . atorvastatin (LIPITOR) tablet 20 mg  20 mg Oral q1800 Nira Conn A, NP   20 mg at 11/09/18 1718  . doxycycline (VIBRA-TABS) tablet 100 mg  100 mg Oral Q12H Nira Conn A, NP   100 mg  at 11/10/18 0816  . hydrOXYzine (ATARAX/VISTARIL) tablet 50 mg  50 mg Oral TID PRN Antonieta Pertlary,  Lawson, MD      . insulin aspart (novoLOG) injection 0-15 Units  0-15 Units Subcutaneous TID WC Jackelyn PolingBerry, Jason A, NP   5 Units at 11/10/18 [phone]  . insulin aspart (novoLOG) injection 0-5 Units  0-5 Units Subcutaneous QHS Nira ConnBerry, Jason A, NP   3 Units at 11/09/18 2141  . insulin aspart (novoLOG) injection 10 Units  10 Units Subcutaneous TID WC Aldean BakerSykes, Janet E, NP   Stopped at 11/10/18 0827  . [START ON 11/11/2018] insulin glargine (LANTUS) injection 60 Units  60 Units Subcutaneous Daily Antonieta Pertlary,  Lawson, MD      . lamoTRIgine (LAMICTAL) tablet 50 mg  50 mg Oral BID Antonieta Pertlary,  Lawson, MD      . lubiprostone (AMITIZA) capsule 24 mcg  24 mcg Oral BID WC Nira ConnBerry, Jason A, NP   24 mcg at 11/10/18 1050  . metoCLOPramide (REGLAN) tablet 10 mg  10 mg Oral TID AC & HS Nira ConnBerry, Jason A, NP   10 mg at 11/10/18 0624  . mirtazapine (REMERON) tablet 7.5 mg  7.5 mg Oral QHS Aldean BakerSykes, Janet E, NP   7.5 mg at 11/09/18 2140  . multivitamin with minerals tablet 1 tablet  1 tablet Oral Daily Nira ConnBerry, Jason A, NP   1 tablet at 11/10/18 0816  . pantoprazole (PROTONIX) EC tablet 40 mg  40 mg Oral BID Antonieta Pertlary,  Lawson, MD      . propranolol (INDERAL) tablet 10 mg  10 mg Oral TID Nira ConnBerry, Jason A, NP   10 mg at 11/10/18 0819  . QUEtiapine (SEROQUEL) tablet 100 mg  100 mg Oral QHS Antonieta Pertlary,  Lawson, MD      . QUEtiapine (SEROQUEL) tablet 25 mg  25 mg Oral BID Aldean BakerSykes, Janet E, NP   25 mg at 11/10/18 0819  . topiramate (TOPAMAX) tablet 25 mg  25 mg Oral BID Nira ConnBerry, Jason A, NP   25 mg at 11/10/18 0815    Lab Results:  Results for orders placed or performed during the hospital encounter of 11/07/18 (from the  past 48 hour(s))  Glucose, capillary     Status: Abnormal   Collection Time: 11/08/18 11:50 AM  Result Value Ref Range   Glucose-Capillary 209 (H) 70 - 99 mg/dL  Glucose, capillary     Status: Abnormal   Collection Time: 11/08/18  5:01 PM  Result Value Ref Range   Glucose-Capillary 232 (H) 70 - 99 mg/dL   Comment 1 Notify RN   Glucose, capillary     Status: Abnormal   Collection Time: 11/08/18  8:40 PM  Result Value Ref Range   Glucose-Capillary 228 (H) 70 - 99 mg/dL   Comment 1 Notify RN   Glucose, capillary     Status: Abnormal   Collection Time: 11/09/18  6:08 AM  Result Value Ref Range   Glucose-Capillary 355 (H) 70 - 99 mg/dL  Glucose, capillary     Status: Abnormal   Collection Time: 11/09/18 11:47 AM  Result Value Ref Range   Glucose-Capillary 146 (H) 70 - 99 mg/dL   Comment 1 Notify RN    Comment 2 Document in Chart   Glucose, capillary     Status: Abnormal   Collection Time: 11/09/18  4:23 PM  Result Value Ref Range   Glucose-Capillary 209 (H) 70 - 99 mg/dL   Comment 1 Notify RN   Glucose, capillary     Status:  Abnormal   Collection Time: 11/09/18  8:16 PM  Result Value Ref Range   Glucose-Capillary 298 (H) 70 - 99 mg/dL   Comment 1 Notify RN   Glucose, capillary     Status: Abnormal   Collection Time: 11/10/18  5:48 AM  Result Value Ref Range   Glucose-Capillary 242 (H) 70 - 99 mg/dL   Comment 1 Notify RN   Glucose, capillary     Status: Abnormal   Collection Time: 11/10/18  8:17 AM  Result Value Ref Range   Glucose-Capillary 240 (H) 70 - 99 mg/dL   Comment 1 Notify RN    Comment 2 Document in Chart     Blood Alcohol level:  Lab Results  Component Value Date   ETH <10 11/03/2018   ETH <10 41/32/4401    Metabolic Disorder Labs: Lab Results  Component Value Date   HGBA1C 10.2 (H) 11/07/2018   MPG 246.04 11/07/2018   MPG 297.7 01/24/2018   No results found for: PROLACTIN Lab Results  Component Value Date   CHOL 95 11/07/2018   TRIG 86  11/07/2018   HDL 52 11/07/2018   CHOLHDL 1.8 11/07/2018   VLDL 17 11/07/2018   LDLCALC 26 11/07/2018   LDLCALC 72 10/05/2018    Physical Findings: AIMS: Facial and Oral Movements Muscles of Facial Expression: None, normal Lips and Perioral Area: None, normal Jaw: None, normal Tongue: None, normal,Extremity Movements Upper (arms, wrists, hands, fingers): None, normal Lower (legs, knees, ankles, toes): None, normal, Trunk Movements Neck, shoulders, hips: None, normal, Overall Severity Severity of abnormal movements (highest score from questions above): None, normal Incapacitation due to abnormal movements: None, normal Patient's awareness of abnormal movements (rate only patient's report): No Awareness, Dental Status Current problems with teeth and/or dentures?: No Does patient usually wear dentures?: No  CIWA:  CIWA-Ar Total: 1 COWS:  COWS Total Score: 1  Musculoskeletal: Strength & Muscle Tone: within normal limits Gait & Station: normal Patient leans: N/A  Psychiatric Specialty Exam: Physical Exam  Nursing note and vitals reviewed. Constitutional: She is oriented to person, place, and time. She appears well-developed and well-nourished.  HENT:  Head: Normocephalic and atraumatic.  Respiratory: Effort normal.  Neurological: She is alert and oriented to person, place, and time.    ROS  Blood pressure 113/81, pulse (!) 122, temperature 98.4 F (36.9 C), temperature source Oral, last menstrual period 10/19/2018, SpO2 100 %.There is no height or weight on file to calculate BMI.  General Appearance: Casual  Eye Contact:  Fair  Speech:  Normal Rate  Volume:  Normal  Mood:  Dysphoric  Affect:  Congruent  Thought Process:  Coherent and Descriptions of Associations: Intact  Orientation:  Full (Time, Place, and Person)  Thought Content:  Logical  Suicidal Thoughts:  No  Homicidal Thoughts:  No  Memory:  Immediate;   Fair Recent;   Fair Remote;   Fair  Judgement:  Intact   Insight:  Fair  Psychomotor Activity:  Normal  Concentration:  Concentration: Fair and Attention Span: Fair  Recall:  [employer] of Knowledge:  Fair  Language:  Good  Akathisia:  Negative  Handed:  Right  AIMS (if indicated):     Assets:  Desire for Improvement Resilience Talents/Skills  ADL's:  Intact  Cognition:  WNL  Sleep:  Number of Hours: 5.25     Treatment Plan Summary: Daily contact with patient to assess and evaluate symptoms and progress in treatment, Medication management and Plan : Patient is seen  and examined.  Patient is a 18 year old female with the above-stated past psychiatric history who is seen in follow-up.   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  Patient is seen in follow-up.  She feels better, but still not sleeping well.  She also has had increased irritability since she reduced Lamictal.  I am going to increase her Lamictal back up to 50 mg p.o. twice daily.  We will continue the mirtazapine 7.5 mg p.o. nightly for depression as well as insomnia.  We will increase her Seroquel to 100 mg p.o. nightly, and at least for now we will continue the 25 mg p.o. twice daily.  She continues on Topamax 25 mg p.o. twice daily for migraine headaches.  She continues on Amitiza for chronic constipation.  She has problems with chronic nausea and is on Reglan as well at 10 mg p.o. 3 times daily.  Her blood sugar this morning is 240, and I will increase her Lantus insulin to this 60 units subcu nightly.  She has been on propranolol 10 mg p.o. 3 times daily as an outpatient, yet remains tachycardic.  I will increase that to 20 mg p.o. 3 times daily.  Hopefully the beta-blockade will not mask her hypoglycemic episodes. 1.  Continue amlodipine 5 mg p.o. daily for  hypertension. 2.  Continue Lipitor 20 mg p.o. every afternoon for hyperlipidemia. 3.  Continue doxycycline 100 mg p.o. every 12 hours for infection. 4.  Increase hydroxyzine to 50 mg p.o. 3 times daily as needed anxiety. 5.  Continue sliding scale insulin. 6.  Increase Lantus to 60 units subcu nightly for diabetes mellitus. 7.  Continue NovoLog 10 units subcu 3 times daily AC for diabetes mellitus. 8.  Increase Lamictal back to 50 mg p.o. twice daily for mood stability as well as seizure activity. 9.  Continue Amitiza 24 mcg p.o. twice daily with meals for chronic constipation. 10.  Continue metoclopramide 10 mg p.o. 3 times daily AC for gastroparesis and chronic nausea. 11.  Continue mirtazapine 7.5 mg p.o. nightly for insomnia and depression. 12.  Continue multivitamin 1 tablet p.o. daily for nutritional supplementation. 13.  Increase Protonix to 40 mg p.o. twice daily for reflux disease. 14.  Increase propranolol to 20 mg p.o. 3 times daily for tachycardia. 15.  Increase Seroquel to 100 mg p.o. nightly, but continue Seroquel 25 mg p.o. twice daily for mood stability and insomnia. 16.  Continue topiramate 25 mg p.o. twice daily for migraine headache prevention. 17.  Disposition planning-in progress.  Antonieta Pert, MD 11/10/2018, 11:04 AM

## 2018-11-10 NOTE — Plan of Care (Signed)
Patient is visible in the milieu. Currently in the dayroom with peers. Calm and cooperative. Denying thoughts of self harm. Denying hallucinations. Avoiding long conversations but reporting that she is feeling better. Requested sleeping medications for tonight. No additional concerns. Support and encouragements provided. Safety precautions maintained.

## 2018-11-10 NOTE — Progress Notes (Signed)
[facility] Group Notes:  (Nursing/MHT/Case Management/Adjunct)  Date:  11/10/2018  Time:  2030  Type of Therapy:  wrap up group  Participation Level:  Active  Participation Quality:  Appropriate, Attentive, Sharing and Supportive  Affect:  Appropriate  Cognitive:  Appropriate  Insight:  Improving  Engagement in Group:  Engaged  Modes of Intervention:  Clarification, Education and Support  Summary of Progress/Problems:  Shellia Cleverly 11/10/2018, 9:18 PM

## 2018-11-10 NOTE — [facility] Group Notes (Signed)
LCSW Aftercare Discharge Planning Group Note  11/10/2018 8:45am  Type of Group and Topic: Psychoeducational Group: Discharge Planning  Participation Level: Active  Description of Group Discharge planning group reviews patient's anticipated discharge plans and assists patients to anticipate and address any barriers to wellness/recovery in the community. Suicide prevention education is reviewed with patients in group.  Therapeutic Goals  1. Patients will state their anticipated discharge plan and mental health aftercare  2. Patients will identify potential barriers to wellness in the community setting  3. Patients will engage in problem solving, solution focused discussion of ways to anticipate and address barriers to wellness/recovery  Summary of Patient Progress:  Shakiyla was engaged and participated throughout the group session. Sholonda reports she feels good about her discharge plans. She reports she plans to return home with her mother.    Transportation Means: Reports her mother will pick her up.   Supports: Mother  Therapeutic Modalities: Motivational Interviewing; Psycho-Educations  Chevis Pretty  11/10/2018 2:51 PM

## 2018-11-10 NOTE — Progress Notes (Signed)
Pt CBG 242 this morning. 5 units administered. Pt unsure if she's able to eat 50% of her breakfast this am. Pt will notify nurse if she eats 50% of her breakfast. Writer will report off to oncoming nurse.

## 2018-11-10 NOTE — Progress Notes (Signed)
Patient ID: Nancy [name], female   DOB: [birth year], 18 y.o.   MRN: [id number]  [place] NOVEL CORONAVIRUS (COVID-19) DAILY CHECK-OFF SYMPTOMS - answer yes or no to each - every day NO YES  Have you had a fever in the past 24 hours?  . Fever (Temp > 37.80C / 100F) X   Have you had any of these symptoms in the past 24 hours? . New Cough .  Sore Throat  .  Shortness of Breath .  Difficulty Breathing .  Unexplained Body Aches   X   Have you had any one of these symptoms in the past 24 hours not related to allergies?   . Runny Nose .  Nasal Congestion .  Sneezing   X   If you have had runny nose, nasal congestion, sneezing in the past 24 hours, has it worsened?  X   EXPOSURES - check yes or no X   Have you traveled outside the state in the past 14 days?  X   Have you been in contact with someone with a confirmed diagnosis of COVID-19 or PUI in the past 14 days without wearing appropriate PPE?  X   Have you been living in the same home as a person with confirmed diagnosis of COVID-19 or a PUI (household contact)?    X   Have you been diagnosed with COVID-19?    X              What to do next: Answered NO to all: Answered YES to anything:   Proceed with unit schedule Follow the [facility] Inpatient Flowsheet.

## 2018-11-11 LAB — GLUCOSE, CAPILLARY
Glucose-Capillary: 193 mg/dL — ABNORMAL HIGH (ref 70–99)
Glucose-Capillary: 159 mg/dL — ABNORMAL HIGH (ref 70–99)

## 2018-11-11 MED ORDER — INSULIN GLARGINE 100 UNIT/ML ~~LOC~~ SOLN: 60.0000 [IU] | Freq: Every day | SUBCUTANEOUS | Status: DC

## 2018-11-11 MED ORDER — MIRTAZAPINE 7.5 MG PO TABS: 7.5000 mg | ORAL_TABLET | Freq: Every day | ORAL | 0 refills | Status: DC

## 2018-11-11 MED ORDER — QUETIAPINE FUMARATE 25 MG PO TABS: 25.0000 mg | ORAL_TABLET | Freq: Two times a day (BID) | ORAL | 0 refills | Status: DC

## 2018-11-11 MED ORDER — HYDROXYZINE HCL 50 MG PO TABS: 50.0000 mg | ORAL_TABLET | Freq: Three times a day (TID) | ORAL | 0 refills | Status: DC | PRN

## 2018-11-11 MED ORDER — TRAZODONE HCL 100 MG PO TABS: 300.0000 mg | ORAL_TABLET | Freq: Every day | ORAL | 0 refills | Status: DC

## 2018-11-11 MED ORDER — TOPIRAMATE 25 MG PO TABS: 25.0000 mg | ORAL_TABLET | Freq: Two times a day (BID) | ORAL | 0 refills | Status: DC

## 2018-11-11 MED ORDER — PROPRANOLOL HCL 20 MG PO TABS: 20.0000 mg | ORAL_TABLET | Freq: Three times a day (TID) | ORAL | 0 refills | Status: DC

## 2018-11-11 MED ORDER — INSULIN GLARGINE 100 UNIT/ML ~~LOC~~ SOLN: 60.0000 [IU] | Freq: Every day | SUBCUTANEOUS | 0 refills | Status: DC

## 2018-11-11 MED ORDER — LAMOTRIGINE 25 MG PO TABS: 50.0000 mg | ORAL_TABLET | Freq: Two times a day (BID) | ORAL | 0 refills | Status: DC

## 2018-11-11 MED ORDER — FLUOXETINE HCL 40 MG PO CAPS: 40.0000 mg | ORAL_CAPSULE | Freq: Every day | ORAL | 0 refills | Status: DC

## 2018-11-11 MED ORDER — PANTOPRAZOLE SODIUM 40 MG PO TBEC: 40.0000 mg | DELAYED_RELEASE_TABLET | Freq: Two times a day (BID) | ORAL | 0 refills | Status: DC

## 2018-11-11 MED ORDER — QUETIAPINE FUMARATE 100 MG PO TABS: 100.0000 mg | ORAL_TABLET | Freq: Every day | ORAL | 0 refills | Status: DC

## 2018-11-11 NOTE — Progress Notes (Signed)
  [facility] Adult Case Management Discharge Plan :  Will you be returning to the same living situation after discharge:  Yes At discharge, do you have transportation home?: Yes,  Lyft Do you have the ability to pay for your medications: Yes,  Medicaid.  Release of information consent forms completed and in the chart. Letters on chart.  Patient to Follow up at: Follow-up Information    [facility] Follow up on 11/19/2018.   Why: Hospital follow up appointment is Friday 11/13 at 3:00p.  Please bring your photo ID, SSN, and current medications.  Contact information: [place] [id number] ph: [phone] fx: [phone]          Next level of care provider has access to [place] and Suicide Prevention discussed: Yes,  with patient.   Has patient been referred to the Quitline?: N/A patient is not a smoker  Patient has been referred for addiction treatment: Yes  Joellen Jersey, [place] 11/11/2018, 9:16 AM

## 2018-11-11 NOTE — Plan of Care (Signed)
Discharge note  Patient verbalizes readiness for discharge. Follow up plan explained, AVS, Transition record and SRA given. Prescriptions and teaching provided. Belongings returned and signed for. Suicide safety plan completed and signed. Patient verbalizes understanding. Patient denies SI/HI and assures this writer she will seek assistance should that change. Patient discharged .  Problem: Education: Goal: Ability to state activities that reduce stress will improve Outcome: Adequate for Discharge   Problem: Education: Goal: Utilization of techniques to improve thought processes will improve Outcome: Adequate for Discharge   Problem: Self-Concept: Goal: Will verbalize positive feelings about self Outcome: Adequate for Discharge

## 2018-11-11 NOTE — Discharge Summary (Signed)
Physician Discharge Summary Note  Patient:  Nancy [name] is an 18 y.o., female  MRN:  [id number]  DOB:  08/16/2000  Patient phone:  [phone] (home)   Patient address:   [address] [place] [id number],   Total Time spent with patient: Greater than 30 minutes  Date of Admission:  11/07/2018  Date of Discharge: 11-11-18  Reason for Admission: Worsening symptoms of depression & suicidal ideations with plans to hang herself or overdose on medications.  Principal Problem: MDD (major depressive disorder), recurrent episode, severe (HCC)  Discharge Diagnoses: Principal Problem:   MDD (major depressive disorder), recurrent episode, severe (HCC) Active Problems:   Severe recurrent major depression without psychotic features (HCC)  Past Psychiatric History: Recurrent episodes of depression possible atypical bipolar disorder possible PTSD chronic anxiety.  Multiple medical problems.  Past history of suicide attempts.  Currently denying acute suicidal ideation.  Past Medical History:  Past Medical History:  Diagnosis Date  . Acanthosis nigricans   . Anxiety   . CHF (congestive heart failure) (HCC)   . Chronic lower back pain   . Depression   . DKA, type 1 (HCC) 09/13/2018  . Dyspepsia   . Obesity   . Ovarian cyst    pt is not aware of this hx (11/24/2017)  . Pre-diabetes   . 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 Premature baby   . Seizures (HCC)   . Type II diabetes mellitus (HCC)    insulin dependant    Past Surgical History:  Procedure Laterality Date  . ABDOMINAL HERNIA REPAIR     "I was a baby"  . BIOPSY  10/12/2018   Procedure: BIOPSY;  Surgeon: Lynann BolognaGupta, Rajesh, MD;  [facility];  Service: Endoscopy;;  . ESOPHAGOGASTRODUODENOSCOPY (EGD) WITH PROPOFOL N/A 10/12/2018   Procedure: ESOPHAGOGASTRODUODENOSCOPY (EGD) WITH PROPOFOL;  Surgeon: Lynann BolognaGupta, Rajesh, MD;  [facility];  Service: Endoscopy;  Laterality: N/A;  . HERNIA REPAIR    . LEFT  HEART CATH AND CORONARY ANGIOGRAPHY N/A 10/13/2018   Procedure: LEFT HEART CATH AND CORONARY ANGIOGRAPHY;  Surgeon: Kathleene HazelMcAlhany, Christopher D, MD;  Location: [facility] INVASIVE CV LAB;  Service: Cardiovascular;  Laterality: N/A;  . TONSILLECTOMY AND ADENOIDECTOMY    . WISDOM TOOTH EXTRACTION  2017   Family History:  Family History  Problem Relation Age of Onset  . Diabetes Mother   . Hypertension Mother   . Obesity Mother   . Asthma Mother   . Allergic rhinitis Mother   . Eczema Mother   . Cervical cancer Mother   . Diabetes Father   . Hypertension Father   . Obesity Father   . Hyperlipidemia Father   . Hypertension Paternal Aunt   . Hypertension Maternal Grandfather   . Colon cancer Maternal Grandfather   . Diabetes Paternal Grandmother   . Obesity Paternal Grandmother   . Diabetes Paternal Grandfather   . Obesity Paternal Grandfather   . Angioedema Neg Hx   . Immunodeficiency Neg Hx   . Urticaria Neg Hx   . Stomach cancer Neg Hx   . Esophageal cancer Neg Hx    Family Psychiatric  History: See H&P  Social History:  Social History   Substance and Sexual Activity  Alcohol Use No  . Alcohol/week: 0.0 standard drinks     Social History   Substance and Sexual Activity  Drug Use No    Social History   Socioeconomic History  . Marital status: Single    Spouse name: Not  on file  . Number of children: 0  . Years of education: Not on file  . Highest education level: Not on file  Occupational History  . Occupation: Admission  Social Needs  . Financial resource strain: Not on file  . Food insecurity    Worry: Not on file    Inability: Not on file  . Transportation needs    Medical: Not on file    Non-medical: Not on file  Tobacco Use  . Smoking status: Never Smoker  . Smokeless tobacco: Never Used  Substance and Sexual Activity  . Alcohol use: No    Alcohol/week: 0.0 standard drinks  . Drug use: No  . Sexual activity: Never  Lifestyle  . Physical activity    Days  per week: Not on file    Minutes per session: Not on file  . Stress: Not on file  Relationships  . Social Herbalist on phone: Not on file    Gets together: Not on file    Attends religious service: Not on file    Active member of club or organization: Not on file    Attends meetings of clubs or organizations: Not on file    Relationship status: Not on file  Other Topics Concern  . Not on file  Social History Narrative   Lives with mom and mom's girlfriend.   No EtOH, tobacco, Drugs   Hospital Course: This is one of several psychiatric discharge summaries for this 18 year old AA female with hx of chronic depression & multiple psychiatric admissions in the past. She is known in the [place] possibly in  other psychiatric hospitals within the surrounding areas for worsening symptoms of depression & suicidal ideations. She has been tried on multiple psychotropic medications for her symptoms & it appears nothing has actually been helpful in stabilizing her symptoms. She came to the [facility] around for evaluation & treatment of her symptoms.  After evaluation of her presenting symptoms, the medication regimen for her presenting symptoms were discussed & with her consent initiated. She received, stabilized & was discharged on the medications as listed below on her discharge medication lists. She was enrolled & participated in the group counseling sessions being offered & held on this unit. She learned coping skills. She presented on this admission, other chronic medical conditions that required treatment & monitoring. She was resumed/discharged on all her pertinent home medications for those health issues. She tolerated her treatment regimen without any adverse effects or reactions reported.  During the course of her hospitalization, the 15-minute checks were adequate to ensure Sanvi's's safety.  Patient did not display any dangerous, violent or suicidal behavior on the unit.  She  interacted with patients & staff appropriately, participated appropriately in the group sessions/therapies. Her medications were addressed & adjusted to meet her needs. She was recommended for outpatient follow-up care & medication management upon discharge to assure her continuity of care.  At the time of discharge patient is not reporting any acute suicidal/homicidal ideations. She feels more confident about her self-care & in managing the suicidal thoughts. She currently denies any new issues or concerns. Education and supportive counseling provided throughout her hospital stay & upon discharge.  Today upon her discharge evaluation with the attending psychiatrist, Ajaya shares she is doing well. She denies any other specific concerns. She is sleeping well. Her appetite is good. She denies other physical complaints. She denies AH/VH. She feels that her medications have been helpful &  is in agreement to continue her current treatment regimen as recommended. She was able to engage in safety planning including plan to return to [facility] or contact emergency services if she feels unable to maintain her own safety or the safety of others. Pt had no further questions, comments, or concerns. She left [facility] with all personal belongings in no apparent distress. Transportation per the [employer]..  Physical Findings: AIMS: Facial and Oral Movements Muscles of Facial Expression: None, normal Lips and Perioral Area: None, normal Jaw: None, normal Tongue: None, normal,Extremity Movements Upper (arms, wrists, hands, fingers): None, normal Lower (legs, knees, ankles, toes): None, normal, Trunk Movements Neck, shoulders, hips: None, normal, Overall Severity Severity of abnormal movements (highest score from questions above): None, normal Incapacitation due to abnormal movements: None, normal Patient's awareness of abnormal movements (rate only patient's report): No Awareness, Dental Status Current  problems with teeth and/or dentures?: No Does patient usually wear dentures?: No  CIWA:  CIWA-Ar Total: 1 COWS:  COWS Total Score: 1  Musculoskeletal: Strength & Muscle Tone: within normal limits Gait & Station: normal Patient leans: N/A  Psychiatric Specialty Exam: Physical Exam  Nursing note and vitals reviewed. Constitutional: She is oriented to person, place, and time. She appears well-developed and well-nourished.  HENT:  Head: Normocephalic and atraumatic.  Eyes: Pupils are equal, round, and reactive to light. Conjunctivae are normal.  Neck: Normal range of motion.  Cardiovascular: Regular rhythm and normal heart sounds.  Respiratory: Effort normal. No respiratory distress. She has no wheezes.  Genitourinary:    Genitourinary Comments: Deferred   Musculoskeletal: Normal range of motion.  Neurological: She is alert and oriented to person, place, and time.  Skin: Skin is warm and dry.  Psychiatric: Her speech is normal and behavior is normal. Judgment normal. Her affect is blunt. Thought content is not paranoid. Cognition and memory are normal. She expresses no homicidal and no suicidal ideation.    Review of Systems  Constitutional: Negative.  Negative for chills and fever.  HENT: Negative.   Eyes: Negative.   Respiratory: Negative.  Negative for cough, shortness of breath and wheezing.   Cardiovascular: Negative.  Negative for chest pain and palpitations.  Gastrointestinal: Negative for abdominal pain, constipation, heartburn, nausea and vomiting.  Musculoskeletal: Negative.   Skin: Negative.   Neurological: Negative.   Psychiatric/Behavioral: Positive for depression (Stabilized with medication prior to discharge) and substance abuse (Hx. opioid use disorder). Negative for hallucinations, memory loss and suicidal ideas. The patient is nervous/anxious and has insomnia (Stabilized with medication prior to discharge).     Blood pressure 129/80, pulse (!) 115, temperature 99  F (37.2 C), temperature source Oral, resp. rate 16, last menstrual period 10/19/2018, SpO2 100 %.There is no height or weight on file to calculate BMI.  See Md's discharge SRA   Has this patient used any form of tobacco in the last 30 days? (Cigarettes, Smokeless Tobacco, Cigars, and/or Pipes): No  Blood Alcohol level:  Lab Results  Component Value Date   ETH <10 11/03/2018   ETH <10 09/12/2018   Metabolic Disorder Labs:  Lab Results  Component Value Date   HGBA1C 10.2 (H) 11/07/2018   MPG 246.04 11/07/2018   MPG 297.7 01/24/2018   No results found for: PROLACTIN Lab Results  Component Value Date   CHOL 95 11/07/2018   TRIG 86 11/07/2018   HDL 52 11/07/2018   CHOLHDL 1.8 11/07/2018   VLDL 17 11/07/2018   LDLCALC 26 11/07/2018   LDLCALC 72  10/05/2018   See Psychiatric Specialty Exam and Suicide Risk Assessment completed by Attending Physician prior to discharge.  Discharge destination:  Home  Is patient on multiple antipsychotic therapies at discharge:  No   Has Patient had three or more failed trials of antipsychotic monotherapy by history:  No  Recommended Plan for Multiple Antipsychotic Therapies: NA  Allergies as of 11/11/2018      Reactions   Ibuprofen Other (See Comments)   GI MD said to not take this anymore   Oatmeal       Medication List    STOP taking these medications   ARIPiprazole 20 MG tablet Commonly known as: ABILIFY   FLUoxetine 40 MG capsule Commonly known as: PROZAC   promethazine 25 MG suppository Commonly known as: PHENERGAN   traZODone 100 MG tablet Commonly known as: DESYREL   Tresiba 100 UNIT/ML Soln Generic drug: Insulin Degludec     TAKE these medications     Indication  amLODipine 5 MG tablet Commonly known as: NORVASC Take 1 tablet (5 mg total) by mouth daily.  Indication: High Blood Pressure Disorder   atorvastatin 20 MG tablet Commonly known as: LIPITOR Take 1 tablet (20 mg total) by mouth daily at 6 PM.   Indication: High Amount of Fats in the Blood   hydrOXYzine 50 MG tablet Commonly known as: ATARAX/VISTARIL Take 1 tablet (50 mg total) by mouth 3 (three) times daily as needed for anxiety. What changed:   medication strength  how much to take  Indication: Feeling Anxious   insulin glargine 100 UNIT/ML injection Commonly known as: LANTUS Inject 0.6 mLs (60 Units total) into the skin at bedtime. For diabetes management  Indication: Type 2 Diabetes   insulin lispro 100 UNIT/ML injection Commonly known as: HUMALOG Inject 0.15 mLs (15 Units total) into the skin 2 (two) times daily with a meal.  Indication: Type 2 Diabetes   lamoTRIgine 25 MG tablet Commonly known as: LAMICTAL Take 2 tablets (50 mg total) by mouth 2 (two) times daily. For mood stabilization What changed:   how much to take  additional instructions  Indication: Mood stabilization   lubiprostone 24 MCG capsule Commonly known as: AMITIZA Take 1 capsule (24 mcg total) by mouth 2 (two) times daily with a meal.  Indication: Opioid-Induced Constipation   metoCLOPramide 10 MG tablet Commonly known as: REGLAN Take 1 tablet (10 mg total) by mouth 4 (four) times daily -  before meals and at bedtime.  Indication: Nausea and Vomiting   mirtazapine 7.5 MG tablet Commonly known as: REMERON Take 1 tablet (7.5 mg total) by mouth at bedtime. For depression/sleep  Indication: Major Depressive Disorder, Insomnia   multivitamin with minerals Tabs tablet Take 1 tablet by mouth daily.  Indication: Vitamin and/or Mineral Deficiency   pantoprazole 40 MG tablet Commonly known as: PROTONIX Take 1 tablet (40 mg total) by mouth 2 (two) times daily. For acid reflux What changed:   when to take this  additional instructions  Indication: Gastroesophageal Reflux Disease   propranolol 20 MG tablet Commonly known as: INDERAL Take 1 tablet (20 mg total) by mouth 3 (three) times daily. For anxiety/HTN What changed:    medication strength  how much to take  additional instructions  Indication: Feeling Anxious, High Blood Pressure Disorder   QUEtiapine 100 MG tablet Commonly known as: SEROQUEL Take 1 tablet (100 mg total) by mouth at bedtime. For mood control  Indication: Mood control   QUEtiapine 25 MG tablet Commonly known as: SEROQUEL  Take 1 tablet (25 mg total) by mouth 2 (two) times daily. For agitation  Indication: Agitation   topiramate 25 MG tablet Commonly known as: TOPAMAX Take 1 tablet (25 mg total) by mouth 2 (two) times daily. For seizure activities What changed: additional instructions  Indication: Partial Onset Seizure      Follow-up Information    [facility] Follow up on 11/19/2018.   Why: Hospital follow up appointment is Friday 11/13 at 3:00p.  Please bring your photo ID, SSN, and current medications.  Contact information: [address] [place] [address] ph: [phone] fx: [phone]         Follow-up recommendations: Activity:  As tolerated Diet: As recommended by your primary care doctor. Keep all scheduled follow-up appointments as recommended.   Comments: Prescriptions given at discharge.  Patient agreeable to plan.  Given opportunity to ask questions.  Appears to feel comfortable with discharge denies any current suicidal or homicidal thought. Patient is also instructed prior to discharge to: Take all medications as prescribed by his/her mental healthcare provider. Report any adverse effects and or reactions from the medicines to his/her outpatient provider promptly. Patient has been instructed & cautioned: To not engage in alcohol and or illegal drug use while on prescription medicines. In the event of worsening symptoms, patient is instructed to call the crisis hotline, 911 and or go to the nearest ED for appropriate evaluation and treatment of symptoms. To follow-up with his/her primary care provider for your other medical issues, concerns  and or health care needs.  Signed: Armandina Stammer, NP, PMHNP, FNP-BC 11/11/2018, 10:24 AM

## 2018-11-11 NOTE — [facility] Suicide Risk Assessment (Signed)
[facility] Discharge Suicide Risk Assessment   Principal Problem: <principal problem not specified> Discharge Diagnoses: Active Problems:   Severe recurrent major depression without psychotic features ([place])   Total Time spent with patient: 20 minutes  Musculoskeletal: Strength & Muscle Tone: within normal limits Gait & Station: normal Patient leans: N/A  Psychiatric Specialty Exam: Review of Systems  All other systems reviewed and are negative.   Blood pressure 129/80, pulse (!) 115, temperature 99 F (37.2 C), temperature source Oral, resp. rate 16, last menstrual period 10/19/2018, SpO2 100 %.There is no height or weight on file to calculate BMI.  General Appearance: Casual  Eye Contact::  Good  Speech:  Normal Rate409  Volume:  Normal  Mood:  Euthymic  Affect:  Congruent  Thought Process:  Coherent and Descriptions of Associations: Intact  Orientation:  Full (Time, Place, and Person)  Thought Content:  Logical  Suicidal Thoughts:  No  Homicidal Thoughts:  No  Memory:  Immediate;   Fair Recent;   Fair Remote;   Fair  Judgement:  Intact  Insight:  Fair  Psychomotor Activity:  Normal  Concentration:  Good  Recall:  Good  Fund of Knowledge:Good  Language: Good  Akathisia:  Negative  Handed:  Right  AIMS (if indicated):     Assets:  Desire for Improvement Resilience  Sleep:  Number of Hours: 3.75  Cognition: WNL  ADL's:  Intact   Mental Status Per Nursing Assessment::   On Admission:     Demographic Factors:  Low socioeconomic status  Loss Factors: NA  Historical Factors: Impulsivity  Risk Reduction Factors:   Living with another person, especially a relative and Positive social support  Continued Clinical Symptoms:  Depression:   Impulsivity  Cognitive Features That Contribute To Risk:  None    Suicide Risk:  Minimal: No identifiable suicidal ideation.  Patients presenting with no risk factors but with morbid ruminations; may be classified as minimal  risk based on the severity of the depressive symptoms  Follow-up Information    [facility] Follow up on 11/19/2018.   Why: Hospital follow up appointment is Friday 11/13 at 3:00p.  Please bring your photo ID, SSN, and current medications.  Contact information: [place] [address] ph: [phone] fx: [phone]          Plan Of Care/Follow-up recommendations:  Activity:  ad lib  Sharma Covert, MD 11/11/2018, 9:15 AM

## 2018-11-20 NOTE — H&P (Addendum)
Patient was admitted to [facility] on 11/07/18. Please see 11/07/18 H&P. She was sent to the ED for evaluation of CP on 11/07/18 before returning to [facility].  Attest to NP note

## 2018-11-23 IMAGING — CT CT HEAD W/O CM
4 series · 16 of 47 positions shown, 18 images · non-contrast
Comparison: Report of 03/23/1999.  Images not available.

CLINICAL DATA: Altered level of consciousness.  Unresponsive.

EXAM:
CT HEAD WITHOUT CONTRAST
TECHNIQUE: Contiguous axial images were obtained from the base of the skull
through the vertex without intravenous contrast.

[Series 3: head wo · axial · 0.39mm/px · z∈[-136,-16]mm · 7 of 32 slices shown, 9 images]
[im 4/32  brain]
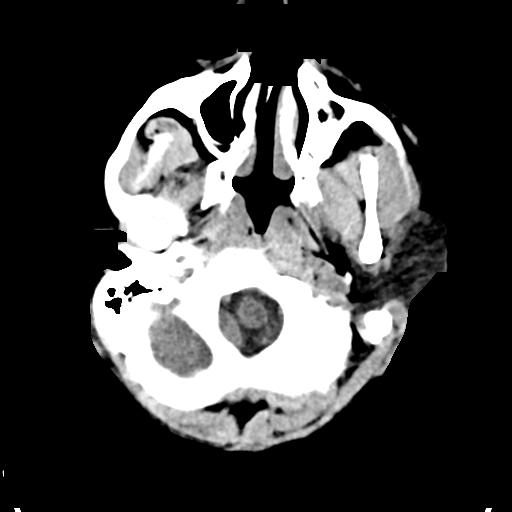
[im 4/32  bone]
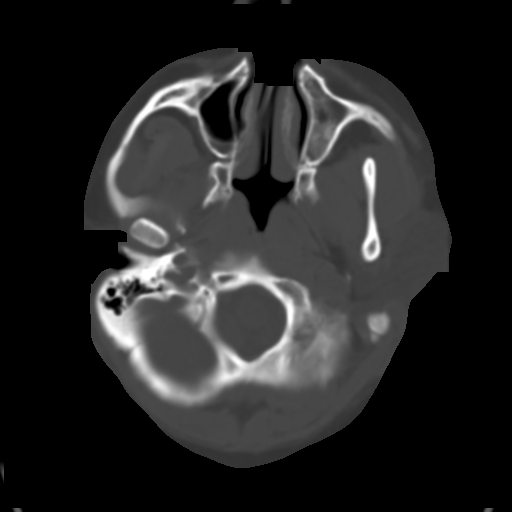
[im 8/32  brain]
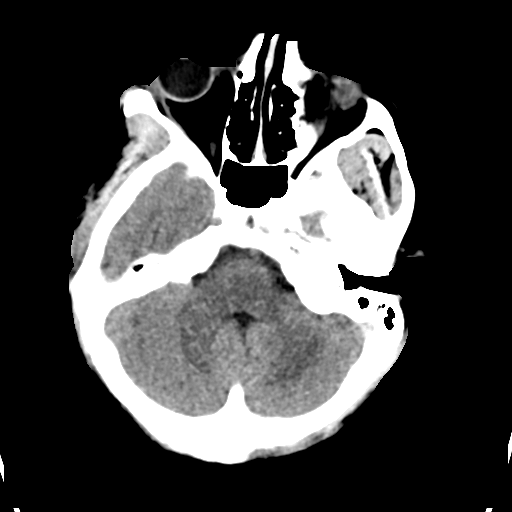
[im 12/32  brain]
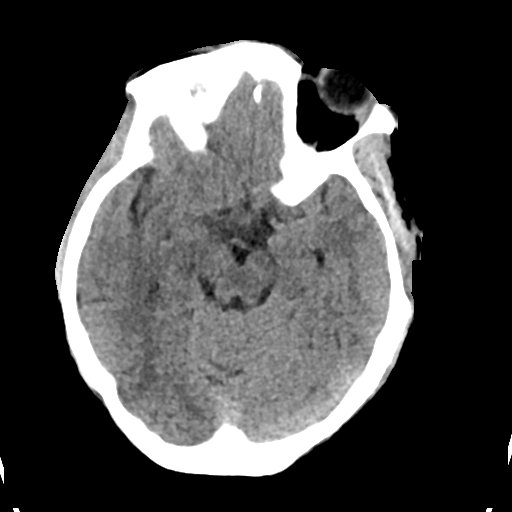
[im 16/32  brain]
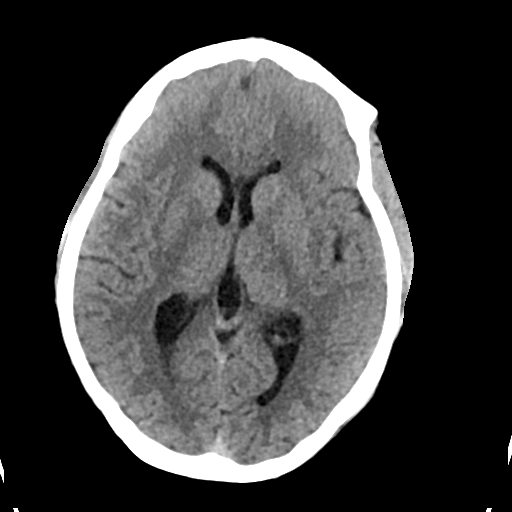
[im 20/32  brain]
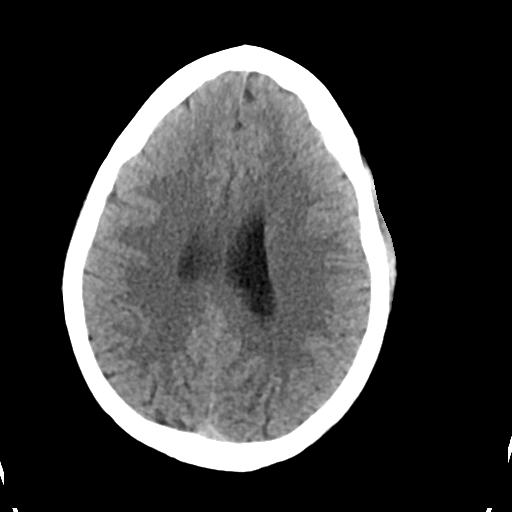
[im 20/32  bone]
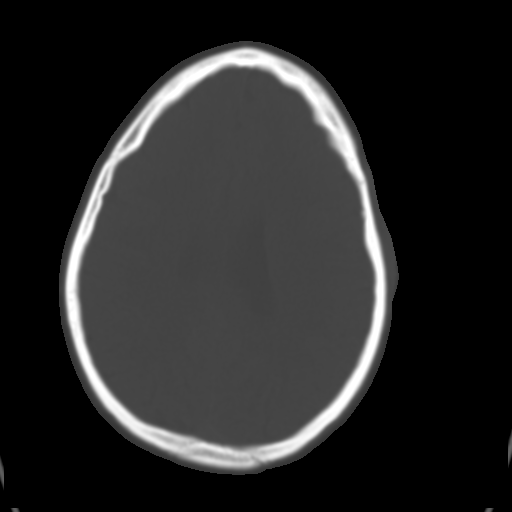
[im 24/32  brain]
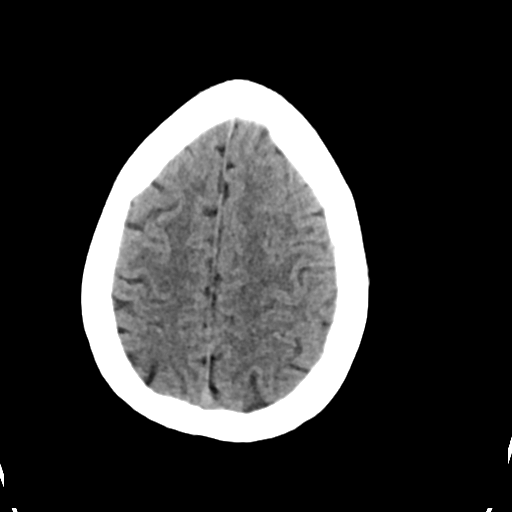
[im 28/32  brain]
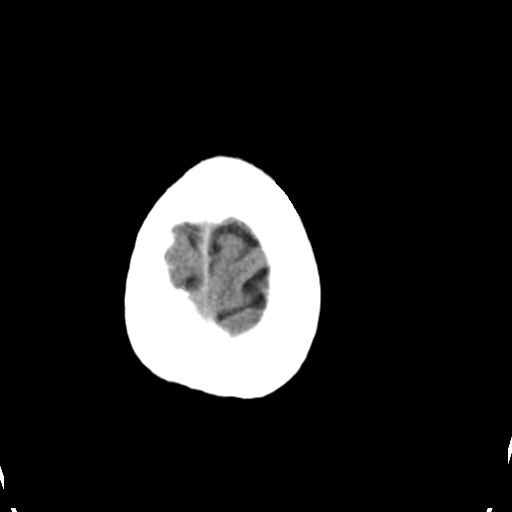

[Series 4: head bone · axial · 0.39mm/px · z∈[-136,-104]mm · 3 of 80 slices shown]
[im 8/80  bone]
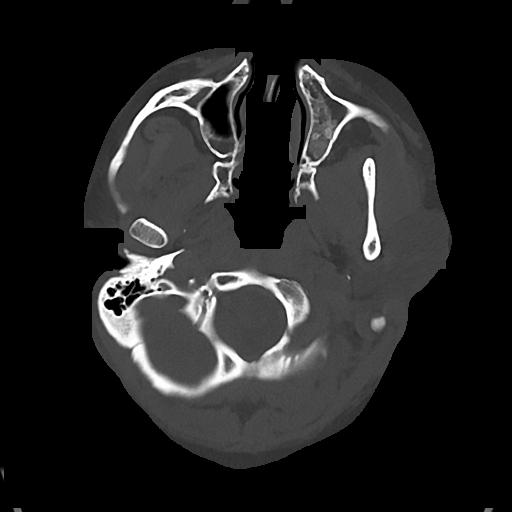
[im 16/80  bone]
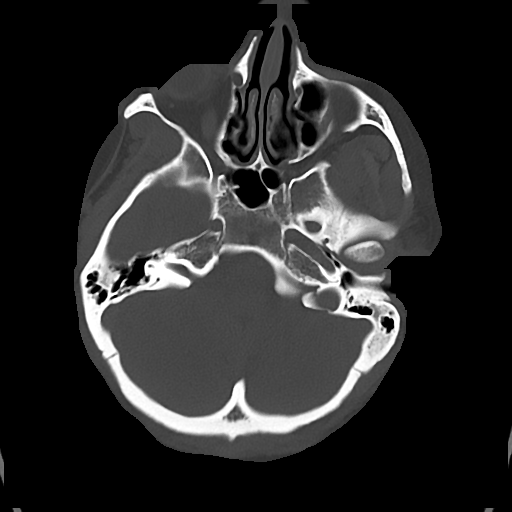
[im 24/80  bone]
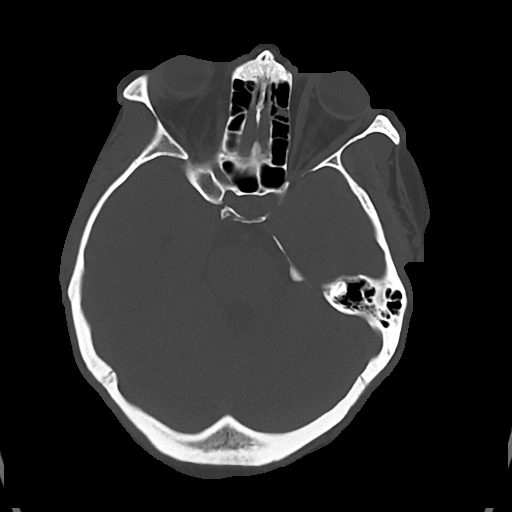

[Series 5: cor soft · coronal · 0.33mm/px · 3 of 63 slices shown]
[im 21/63  brain]
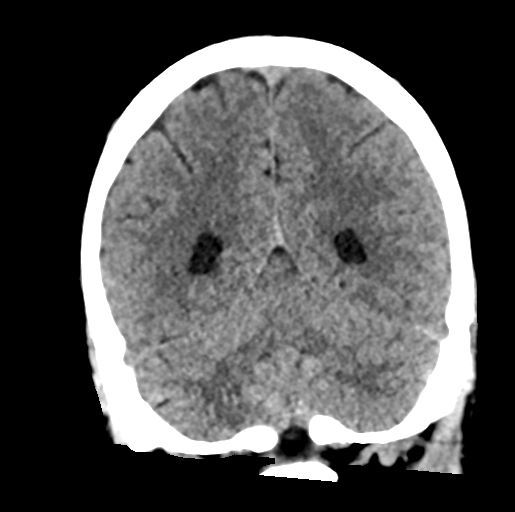
[im 28/63  brain]
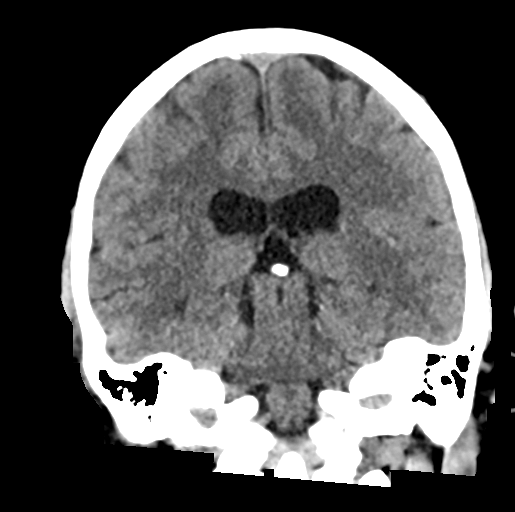
[im 35/63  brain]
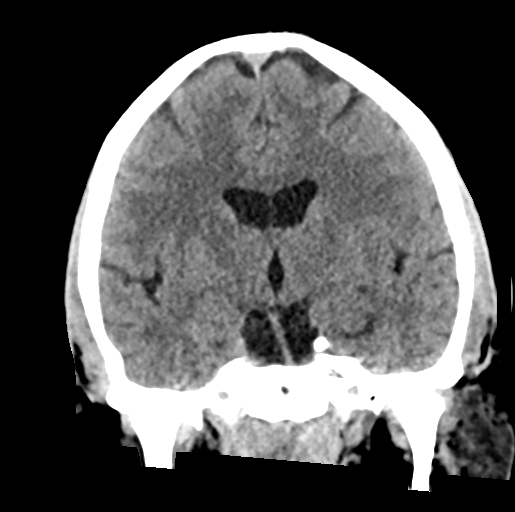

[Series 6: sag soft · sagittal · 0.33mm/px · 3 of 57 slices shown]
[im 19/57  brain]
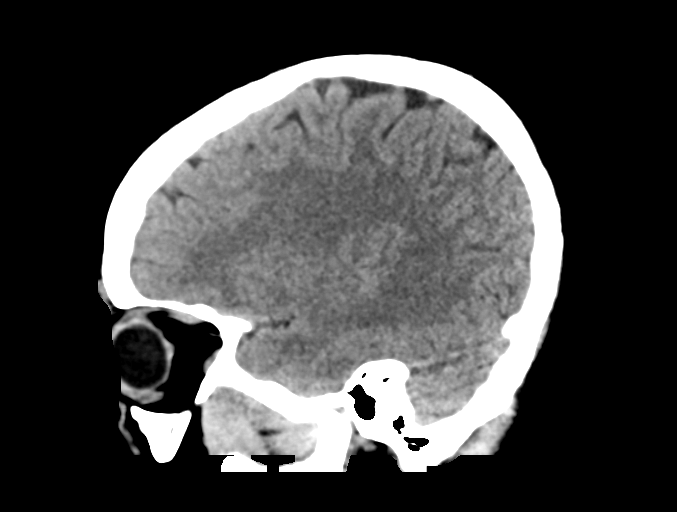
[im 29/57  brain]
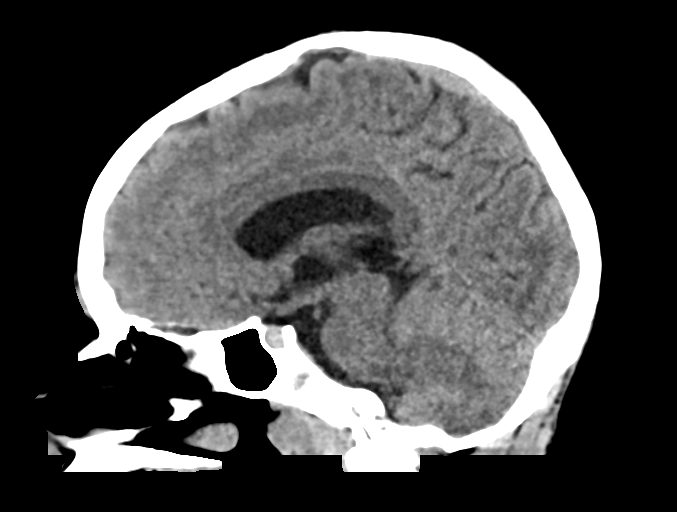
[im 38/57  brain]
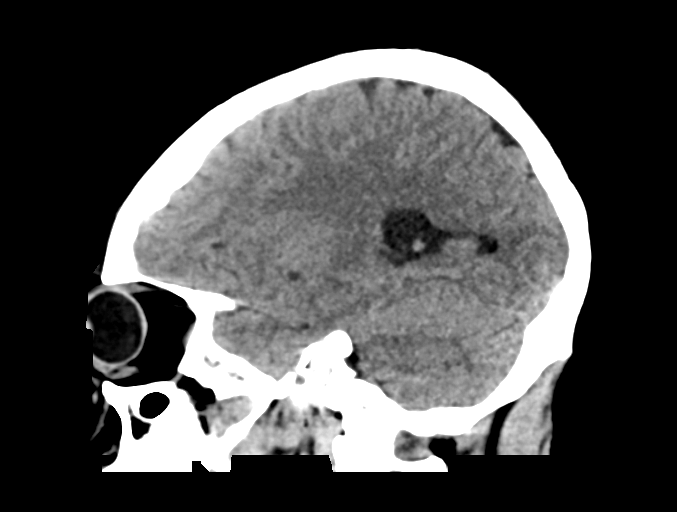

[16 of 47 positions shown; findings below may reference images not displayed]

FINDINGS: Brain: No mass lesion, hemorrhage, hydrocephalus, acute infarct,
intra-axial, or extra-axial fluid collection.

Vascular: No hyperdense vessel or unexpected calcification.

Skull: Patient is slightly obliqued in the scanner. Given this
limitation, no scalp soft tissue swelling identified. No skull
fracture.

Sinuses/Orbits: Normal imaged portions of the orbits and globes.
Hypoplastic frontal sinuses. Otherwise normal paranasal sinuses and
mastoid air cells.

Other: None.
IMPRESSION: No acute intracranial abnormality.

## 2018-11-24 IMAGING — MR MR HEAD W/O CM
9 of 11 series · 35 of 48 positions shown · non-contrast
Comparison: Head CT 11/23/2017

CLINICAL DATA: Confusion.  History of diabetes and prematurity.

EXAM:
MRI HEAD WITHOUT CONTRAST
TECHNIQUE: Multiplanar, multiecho pulse sequences of the brain and surrounding
structures were obtained without intravenous contrast.

[Series 3: DWI · axial · 3.0mm · 1.09mm/px · z∈[-78,+63]mm · 8 of 100 slices shown (1 of 4)]
[im 1/100]
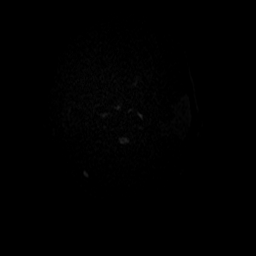
[im 12/100]
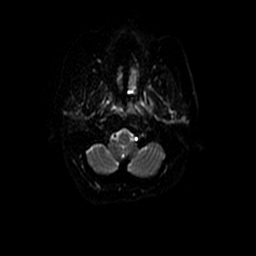
[im 34/100]
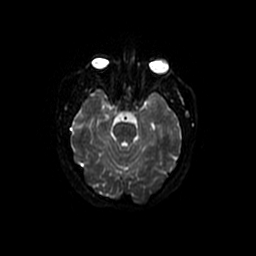
[im 45/100]
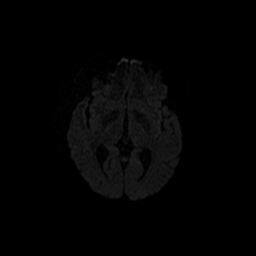
[im 56/100]
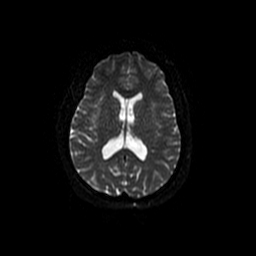
[im 67/100]
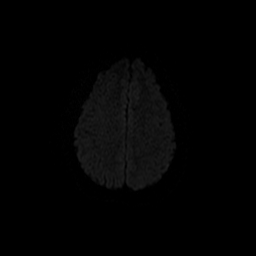
[im 89/100]
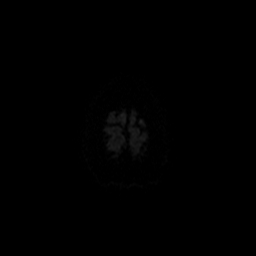
[im 100/100]
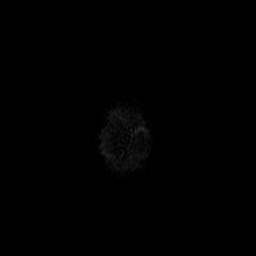

[Series 4: DWI · coronal · 5.0mm · 1.09mm/px · 7 of 70 slices shown (2 of 4)]
[im 1/70]
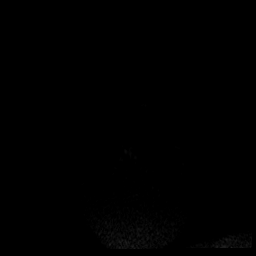
[im 12/70]
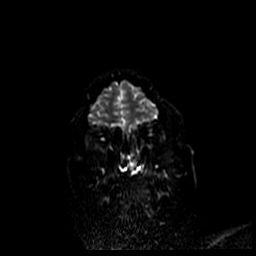
[im 24/70]
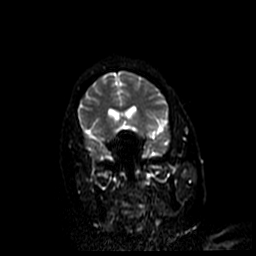
[im 35/70]
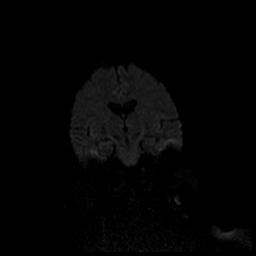
[im 47/70]
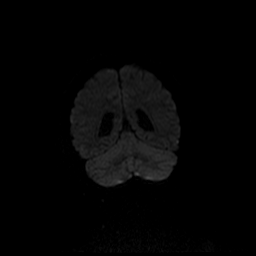
[im 58/70]
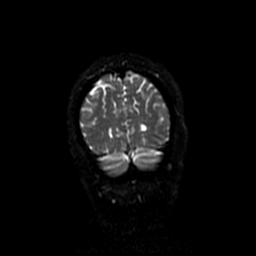
[im 70/70]
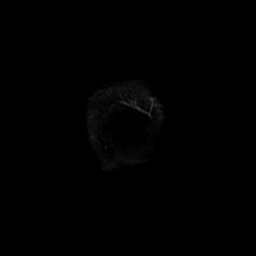

[Series 5: T1 · sagittal · 5.0mm · 0.47mm/px · 2 of 26 slices shown]
[im 1/26]
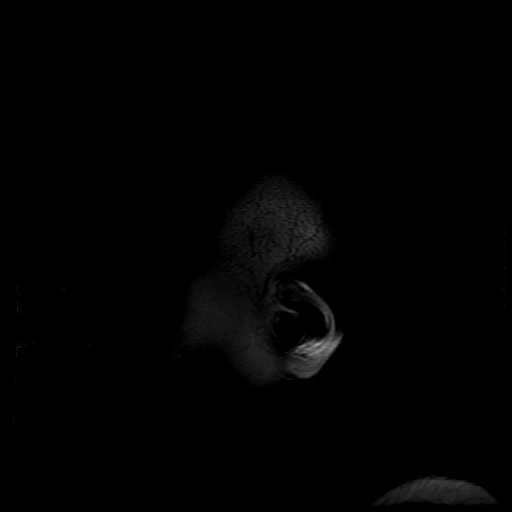
[im 26/26]
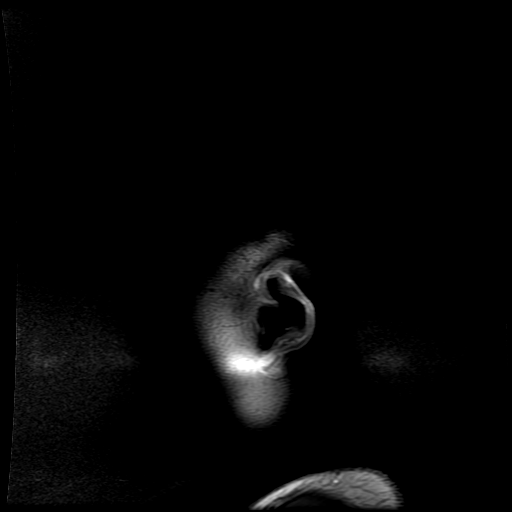

[Series 6: T2 · axial · 5.0mm · 0.43mm/px · z∈[-70,+90]mm · 3 of 28 slices shown (1 of 2)]
[im 1/28]
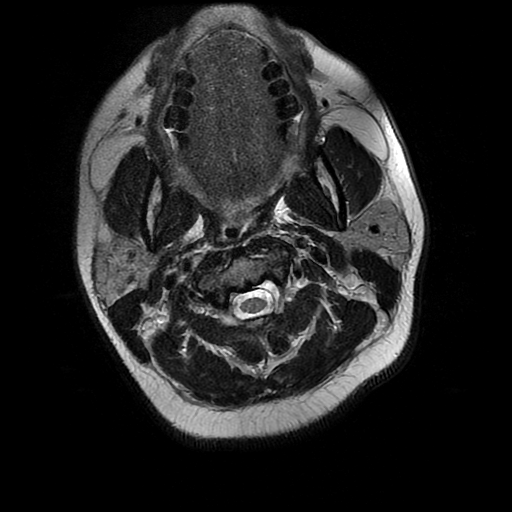
[im 14/28]
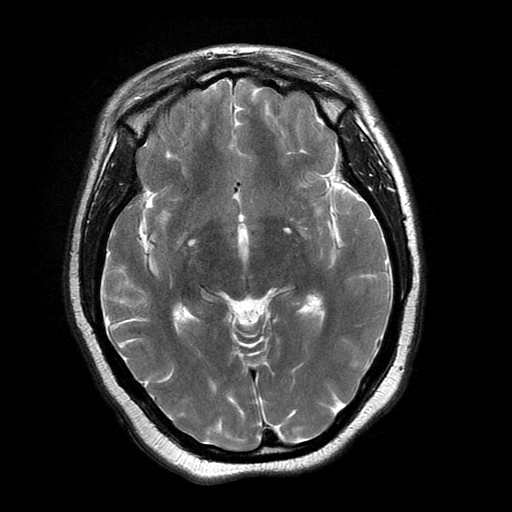
[im 28/28]
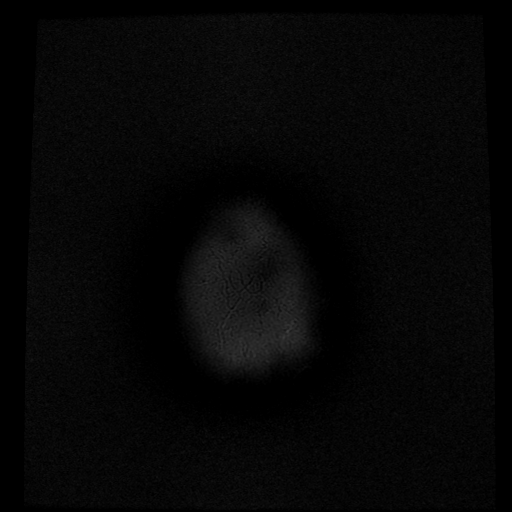

[Series 7: FLAIR · axial · 3.0mm · 0.43mm/px · z∈[-72,+74]mm · 2 of 26 slices shown (1 of 2)]
[im 1/26]
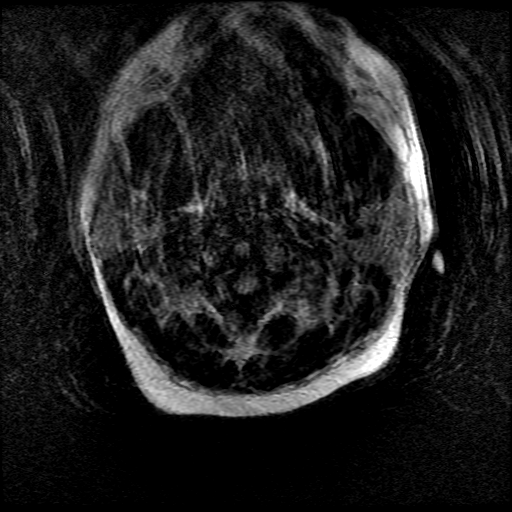
[im 26/26]
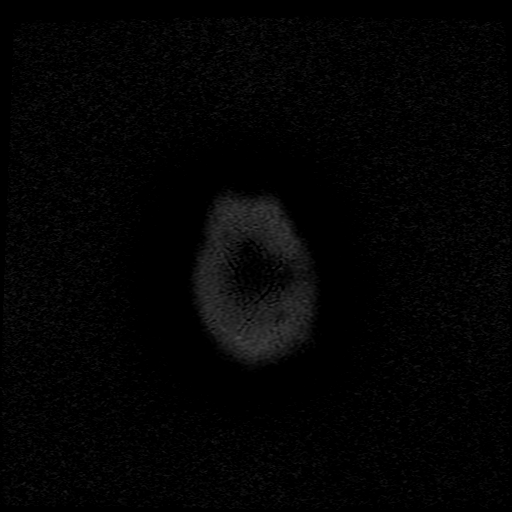

[Series 10: T2 · coronal · 5.0mm · 0.39mm/px · 3 of 28 slices shown (2 of 2)]
[im 1/28]
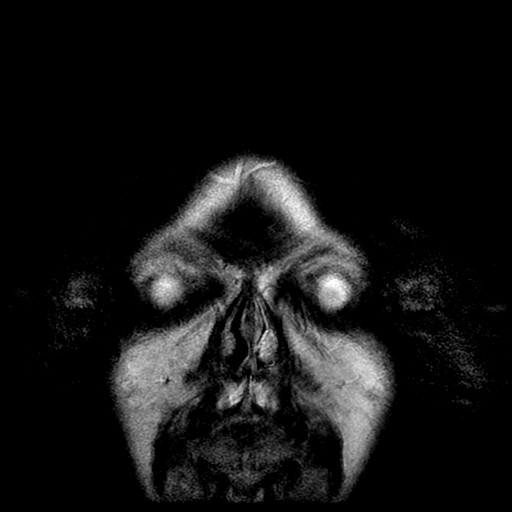
[im 14/28]
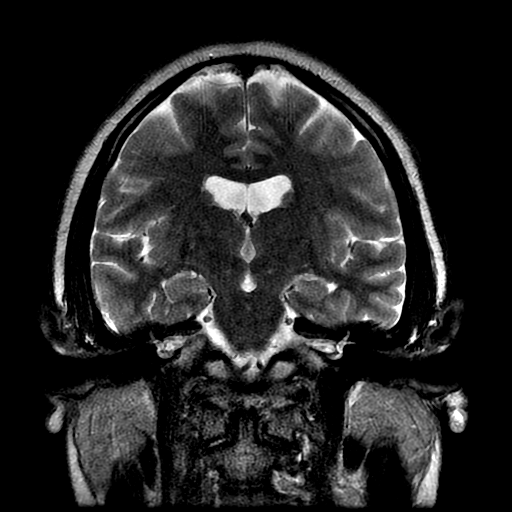
[im 28/28]
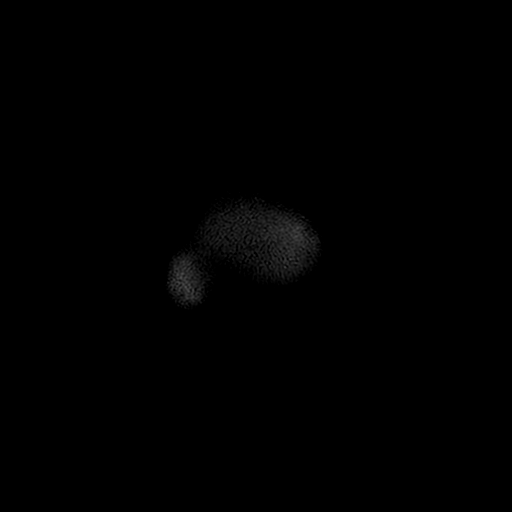

[Series 11: FLAIR · axial · 3.0mm · 0.43mm/px · z∈[-72,+74]mm · 2 of 26 slices shown (2 of 2)]
[im 1/26]
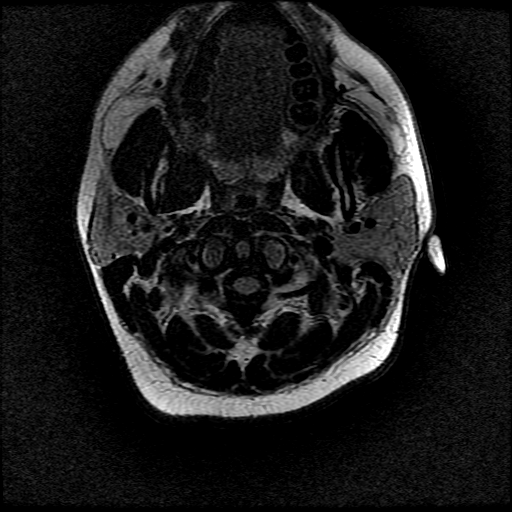
[im 26/26]
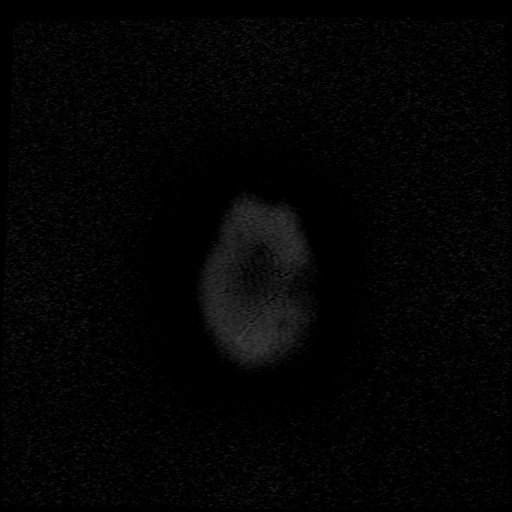

[Series 300: DWI · axial · 3.0mm · 1.09mm/px · z∈[-78,+63]mm · 5 of 50 slices shown (3 of 4)]
[im 1/50]
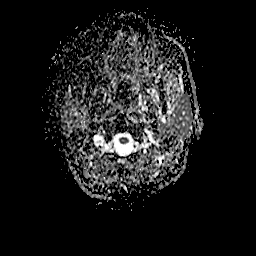
[im 13/50]
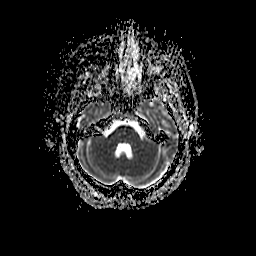
[im 25/50]
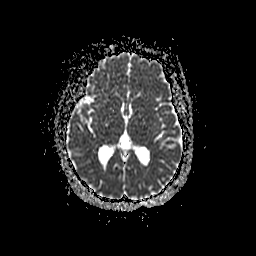
[im 37/50]
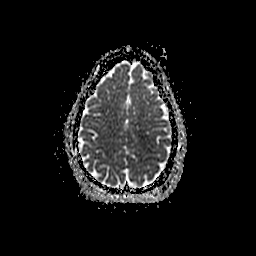
[im 50/50]
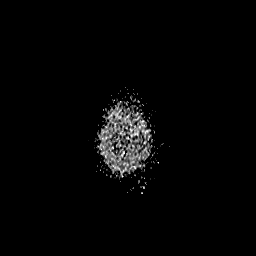

[Series 400: DWI · coronal · 5.0mm · 1.09mm/px · 3 of 35 slices shown (4 of 4)]
[im 1/35]
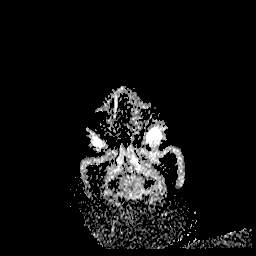
[im 18/35]
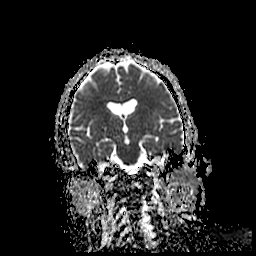
[im 35/35]
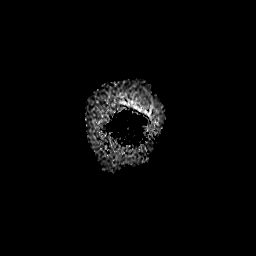

[35 of 48 positions shown; findings below may reference images not displayed]

FINDINGS: The study is mildly motion degraded.

Brain: There is no evidence of acute infarct, intracranial
hemorrhage, mass, midline shift, or extra-axial fluid collection.
The atria of the lateral ventricles are mildly prominent in size
with mildly angular morphology which may reflect white matter loss
related to prematurity. There is no significant gliosis, and the
brain is normal in signal.

Vascular: Major intracranial vascular flow voids are preserved.

Skull and upper cervical spine: Unremarkable bone marrow signal.

Sinuses/Orbits: Unremarkable orbits. Minimal mucosal thickening in
the paranasal sinuses. Clear mastoid air cells.

Other: None.
IMPRESSION: 1. No acute intracranial abnormality.
2. Suspected mild white matter injury of prematurity.

## 2018-11-24 IMAGING — DX DG CHEST 1V PORT
1 series · 1 of 1 positions shown · non-contrast
Comparison: 06/12/2017

CLINICAL DATA: Altered mental status, shortness of breath

EXAM:
PORTABLE CHEST 1 VIEW

[chest ap]
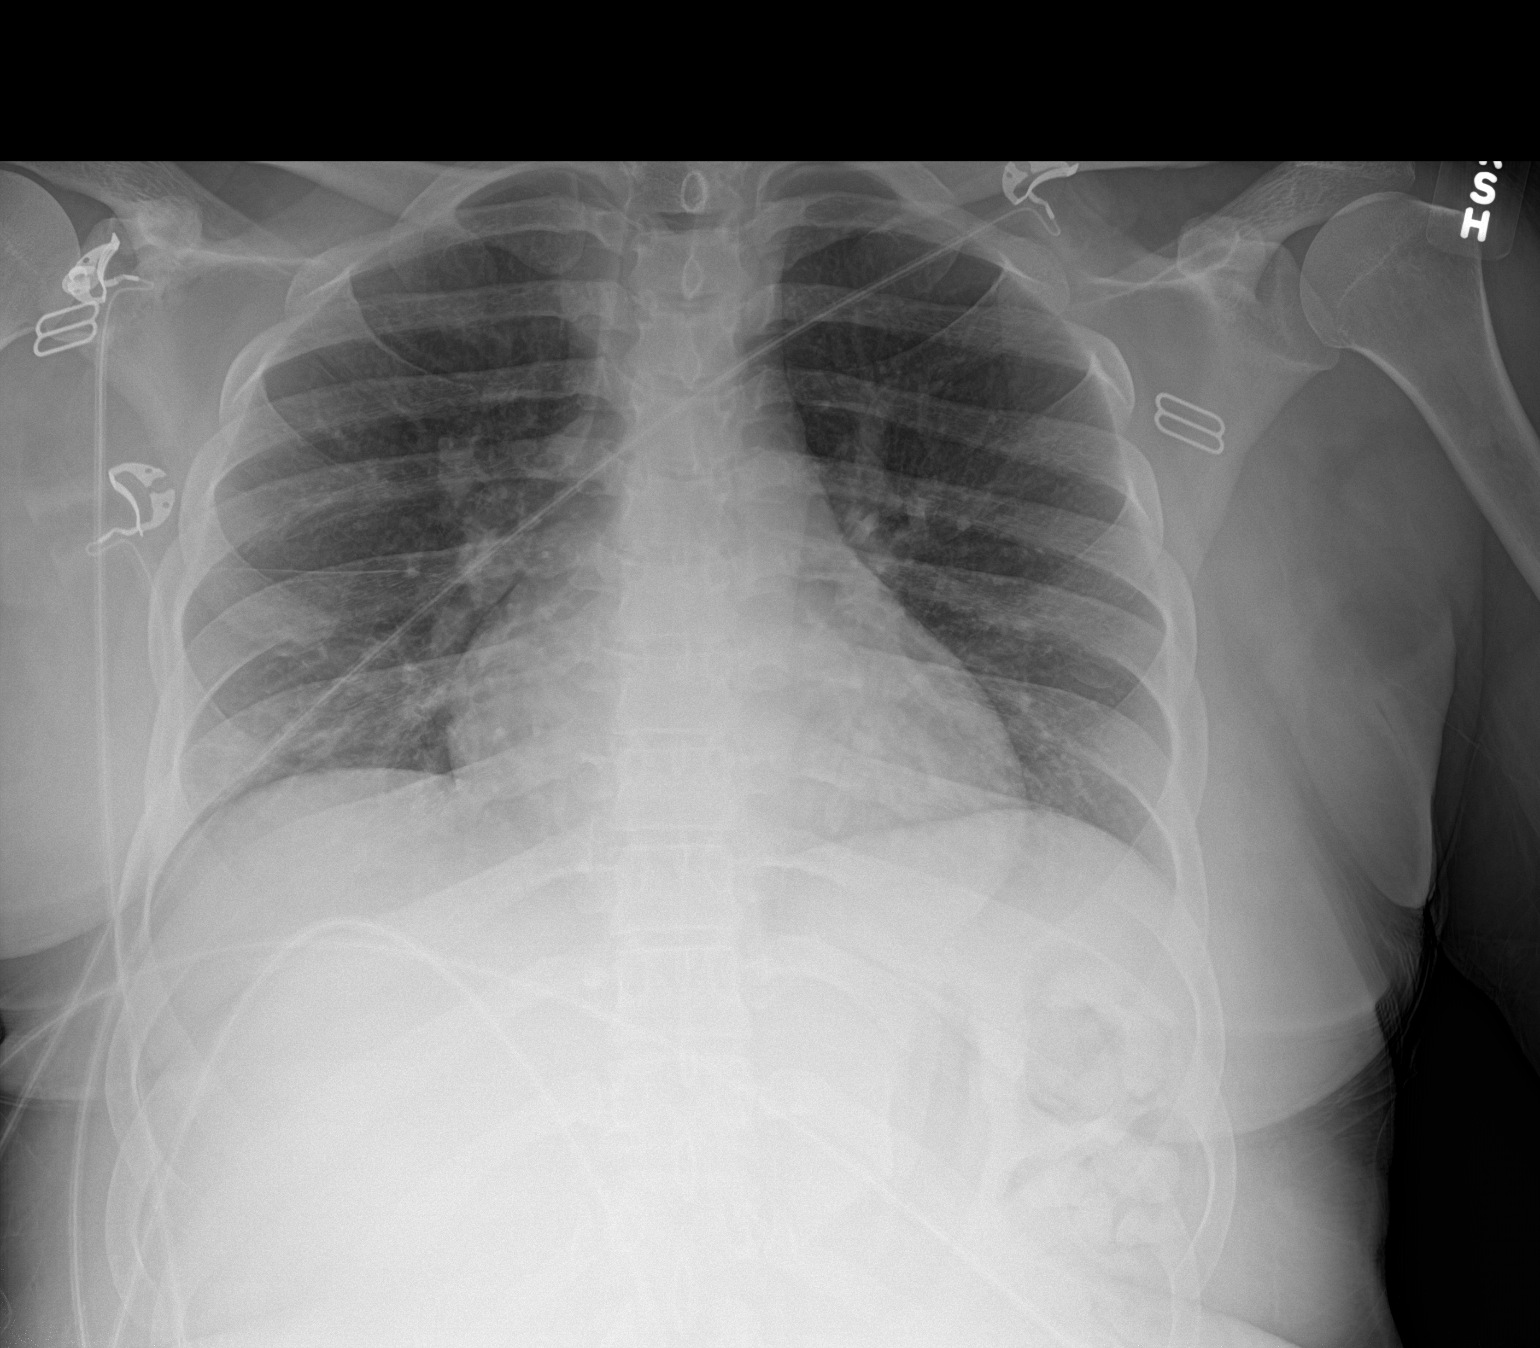

[1 of 1 positions shown; findings below may reference images not displayed]

FINDINGS: Heart and mediastinal contours are within normal limits. No focal
opacities or effusions. No acute bony abnormality.
IMPRESSION: No active disease.

## 2018-11-27 ENCOUNTER — Encounter (HOSPITAL_COMMUNITY): Payer: Self-pay | Admitting: *Deleted

## 2018-11-27 ENCOUNTER — Other Ambulatory Visit: Payer: Self-pay

## 2018-11-27 ENCOUNTER — Inpatient Hospital Stay (HOSPITAL_COMMUNITY)
Admission: EM | Admit: 2018-11-27 | Discharge: 2018-11-30 | DRG: 638 | Disposition: A | Discharge status: Home / Self Care | Payer: Medicaid Other | Attending: Internal Medicine | Admitting: Internal Medicine

## 2018-11-27 DIAGNOSIS — Z79899 Other long term (current) drug therapy: Secondary | ICD-10-CM

## 2018-11-27 DIAGNOSIS — Z825 Family history of asthma and other chronic lower respiratory diseases: Secondary | ICD-10-CM

## 2018-11-27 DIAGNOSIS — Z20828 Contact with and (suspected) exposure to other viral communicable diseases: Secondary | ICD-10-CM | POA: Diagnosis present

## 2018-11-27 DIAGNOSIS — Z833 Family history of diabetes mellitus: Secondary | ICD-10-CM

## 2018-11-27 DIAGNOSIS — G8929 Other chronic pain: Secondary | ICD-10-CM | POA: Diagnosis present

## 2018-11-27 DIAGNOSIS — F332 Major depressive disorder, recurrent severe without psychotic features: Secondary | ICD-10-CM | POA: Diagnosis not present

## 2018-11-27 DIAGNOSIS — R109 Unspecified abdominal pain: Secondary | ICD-10-CM

## 2018-11-27 DIAGNOSIS — Z8249 Family history of ischemic heart disease and other diseases of the circulatory system: Secondary | ICD-10-CM

## 2018-11-27 DIAGNOSIS — R739 Hyperglycemia, unspecified: Secondary | ICD-10-CM | POA: Diagnosis present

## 2018-11-27 DIAGNOSIS — M545 Low back pain: Secondary | ICD-10-CM | POA: Diagnosis present

## 2018-11-27 DIAGNOSIS — E1043 Type 1 diabetes mellitus with diabetic autonomic (poly)neuropathy: Secondary | ICD-10-CM | POA: Diagnosis present

## 2018-11-27 DIAGNOSIS — E1165 Type 2 diabetes mellitus with hyperglycemia: Secondary | ICD-10-CM | POA: Diagnosis present

## 2018-11-27 DIAGNOSIS — Z800 Family history of malignant neoplasm of digestive organs: Secondary | ICD-10-CM

## 2018-11-27 DIAGNOSIS — IMO0002 Reserved for concepts with insufficient information to code with codable children: Secondary | ICD-10-CM | POA: Diagnosis present

## 2018-11-27 DIAGNOSIS — K3184 Gastroparesis: Secondary | ICD-10-CM | POA: Diagnosis present

## 2018-11-27 DIAGNOSIS — M549 Dorsalgia, unspecified: Secondary | ICD-10-CM | POA: Diagnosis present

## 2018-11-27 DIAGNOSIS — E871 Hypo-osmolality and hyponatremia: Secondary | ICD-10-CM | POA: Diagnosis present

## 2018-11-27 DIAGNOSIS — E1065 Type 1 diabetes mellitus with hyperglycemia: Principal | ICD-10-CM

## 2018-11-27 DIAGNOSIS — Z8049 Family history of malignant neoplasm of other genital organs: Secondary | ICD-10-CM

## 2018-11-27 DIAGNOSIS — F445 Conversion disorder with seizures or convulsions: Secondary | ICD-10-CM | POA: Diagnosis present

## 2018-11-27 DIAGNOSIS — I509 Heart failure, unspecified: Secondary | ICD-10-CM | POA: Diagnosis present

## 2018-11-27 DIAGNOSIS — Z8349 Family history of other endocrine, nutritional and metabolic diseases: Secondary | ICD-10-CM

## 2018-11-27 DIAGNOSIS — F419 Anxiety disorder, unspecified: Secondary | ICD-10-CM | POA: Diagnosis present

## 2018-11-27 DIAGNOSIS — I110 Hypertensive heart disease with heart failure: Secondary | ICD-10-CM | POA: Diagnosis present

## 2018-11-27 DIAGNOSIS — K5900 Constipation, unspecified: Secondary | ICD-10-CM | POA: Diagnosis present

## 2018-11-27 DIAGNOSIS — E1010 Type 1 diabetes mellitus with ketoacidosis without coma: Secondary | ICD-10-CM | POA: Diagnosis present

## 2018-11-27 DIAGNOSIS — Z886 Allergy status to analgesic agent status: Secondary | ICD-10-CM

## 2018-11-27 LAB — URINALYSIS, ROUTINE W REFLEX MICROSCOPIC
Bilirubin Urine: NEGATIVE
Glucose, UA: >=500 mg/dL — AB
Hgb urine dipstick: NEGATIVE
Ketones, ur: NEGATIVE mg/dL
Leukocytes,Ua: NEGATIVE
Nitrite: NEGATIVE
Protein, ur: NEGATIVE mg/dL
Specific Gravity, Urine: 1.03 (ref 1.005–1.030)
pH: 6 (ref 5.0–8.0)

## 2018-11-27 LAB — I-STAT BETA HCG BLOOD, ED (MC, WL, AP ONLY): I-stat hCG, quantitative: <5 m[IU]/mL (ref <5)

## 2018-11-27 LAB — CBG MONITORING, ED
Glucose-Capillary: >600 mg/dL (ref 70–99)
Glucose-Capillary: 386 mg/dL — ABNORMAL HIGH (ref 70–99)

## 2018-11-27 LAB — CBC
HCT: 38.4 % (ref 36.0–46.0)
Hemoglobin: 12.1 g/dL (ref 12.0–15.0)
MCH: 27.6 pg (ref 26.0–34.0)
MCHC: 31.5 g/dL (ref 30.0–36.0)
MCV: 87.5 fL (ref 80.0–100.0)
Platelets: 283 10*3/uL (ref 150–400)
RBC: 4.39 MIL/uL (ref 3.87–5.11)
RDW: 13.3 % (ref 11.5–15.5)
WBC: 9.3 10*3/uL (ref 4.0–10.5)
nRBC: 0 % (ref 0.0–0.2)

## 2018-11-27 LAB — BASIC METABOLIC PANEL
Anion gap: 15 (ref 5–15)
BUN: 9 mg/dL (ref 6–20)
CO2: 15 mmol/L — ABNORMAL LOW (ref 22–32)
Calcium: 8.7 mg/dL — ABNORMAL LOW (ref 8.9–10.3)
Chloride: 95 mmol/L — ABNORMAL LOW (ref 98–111)
Creatinine, Ser: 1 mg/dL (ref 0.44–1.00)
GFR calc Af Amer: >60 mL/min (ref >60)
GFR calc non Af Amer: >60 mL/min (ref >60)
Glucose, Bld: 861 mg/dL (ref 70–99)
Potassium: 3.6 mmol/L (ref 3.5–5.1)
Sodium: 125 mmol/L — ABNORMAL LOW (ref 135–145)

## 2018-11-27 LAB — HEPATIC FUNCTION PANEL
ALT: 13 U/L (ref 0–44)
AST: 21 U/L (ref 15–41)
Albumin: 3.6 g/dL (ref 3.5–5.0)
Alkaline Phosphatase: 95 U/L (ref 38–126)
Bilirubin, Direct: 0.1 mg/dL (ref 0.0–0.2)
Indirect Bilirubin: 0.2 mg/dL — ABNORMAL LOW (ref 0.3–0.9)
Total Bilirubin: 0.3 mg/dL (ref 0.3–1.2)
Total Protein: 6.3 g/dL — ABNORMAL LOW (ref 6.5–8.1)

## 2018-11-27 LAB — LIPASE, BLOOD: Lipase: 26 U/L (ref 11–51)

## 2018-11-27 LAB — TROPONIN I (HIGH SENSITIVITY): Troponin I (High Sensitivity): <2 ng/L (ref <18)

## 2018-11-27 MED ORDER — DEXTROSE 50 % IV SOLN: 0.0000 mL | INTRAVENOUS | Status: DC | PRN

## 2018-11-27 MED ORDER — PROMETHAZINE HCL 25 MG/ML IJ SOLN: 12.5000 mg | Freq: Four times a day (QID) | INTRAMUSCULAR | Status: DC | PRN

## 2018-11-27 MED ORDER — SODIUM CHLORIDE 0.9 % IV BOLUS
1000.0000 mL | Freq: Once | INTRAVENOUS | Status: AC
  Administered 2018-11-27: 1000 mL via INTRAVENOUS

## 2018-11-27 MED ORDER — NYSTATIN 100000 UNIT/ML MT SUSP
5.0000 mL | Freq: Three times a day (TID) | OROMUCOSAL | Status: DC
  Administered 2018-11-28 – 2018-11-29 (×8): 500000 [IU] via ORAL
  Filled 2018-11-27 (×12): qty 5

## 2018-11-27 MED ORDER — SODIUM CHLORIDE 0.9 % IV SOLN: INTRAVENOUS | Status: DC

## 2018-11-27 MED ORDER — INSULIN REGULAR(HUMAN) IN NACL 100-0.9 UT/100ML-% IV SOLN: INTRAVENOUS | Status: DC

## 2018-11-27 MED ORDER — SODIUM CHLORIDE 0.9% FLUSH: 3.0000 mL | Freq: Once | INTRAVENOUS | Status: DC

## 2018-11-27 MED ORDER — POTASSIUM CHLORIDE 10 MEQ/100ML IV SOLN
10.0000 meq | INTRAVENOUS | Status: AC
  Administered 2018-11-27 – 2018-11-28 (×2): 10 meq via INTRAVENOUS
  Filled 2018-11-27 (×2): qty 100

## 2018-11-27 MED ORDER — SODIUM CHLORIDE 0.9 % IV BOLUS
1000.0000 mL | Freq: Once | INTRAVENOUS | Status: AC
  Administered 2018-11-28: 1000 mL via INTRAVENOUS

## 2018-11-27 MED ORDER — ONDANSETRON HCL 4 MG/2ML IJ SOLN
4.0000 mg | Freq: Once | INTRAMUSCULAR | Status: AC | PRN
  Administered 2018-11-27: 4 mg via INTRAVENOUS
  Filled 2018-11-27: qty 2

## 2018-11-27 MED ORDER — SODIUM CHLORIDE 0.9 % IV SOLN
INTRAVENOUS | Status: DC
  Administered 2018-11-27: 23:00:00 via INTRAVENOUS

## 2018-11-27 MED ORDER — PANTOPRAZOLE SODIUM 40 MG IV SOLR
40.0000 mg | Freq: Once | INTRAVENOUS | Status: AC
  Administered 2018-11-28: 40 mg via INTRAVENOUS
  Filled 2018-11-27: qty 40

## 2018-11-27 MED ORDER — ONDANSETRON HCL 4 MG/2ML IJ SOLN
4.0000 mg | Freq: Four times a day (QID) | INTRAMUSCULAR | Status: DC | PRN
  Administered 2018-11-29 (×2): 4 mg via INTRAVENOUS
  Filled 2018-11-27 (×2): qty 2

## 2018-11-27 MED ORDER — POTASSIUM CHLORIDE 10 MEQ/100ML IV SOLN: 10.0000 meq | INTRAVENOUS | Status: DC

## 2018-11-27 MED ORDER — INSULIN ASPART 100 UNIT/ML ~~LOC~~ SOLN
15.0000 [IU] | Freq: Once | SUBCUTANEOUS | Status: AC
  Administered 2018-11-27: 15 [IU] via INTRAVENOUS

## 2018-11-27 MED ORDER — INSULIN REGULAR(HUMAN) IN NACL 100-0.9 UT/100ML-% IV SOLN
INTRAVENOUS | Status: DC
  Administered 2018-11-27: 13 [IU]/h via INTRAVENOUS
  Filled 2018-11-27: qty 100

## 2018-11-27 MED ORDER — MORPHINE SULFATE (PF) 4 MG/ML IV SOLN
4.0000 mg | Freq: Once | INTRAVENOUS | Status: AC
  Administered 2018-11-27: 4 mg via INTRAVENOUS
  Filled 2018-11-27: qty 1

## 2018-11-27 MED ORDER — DEXTROSE-NACL 5-0.45 % IV SOLN: INTRAVENOUS | Status: DC

## 2018-11-27 MED ORDER — PROMETHAZINE HCL 25 MG/ML IJ SOLN
12.5000 mg | Freq: Once | INTRAMUSCULAR | Status: AC
  Administered 2018-11-27: 12.5 mg via INTRAVENOUS
  Filled 2018-11-27: qty 1

## 2018-11-27 MED ORDER — DEXTROSE-NACL 5-0.45 % IV SOLN
INTRAVENOUS | Status: DC
  Administered 2018-11-28: via INTRAVENOUS

## 2018-11-27 NOTE — ED Provider Notes (Signed)
[facility] EMERGENCY DEPARTMENT Provider Note   CSN: [id number] Arrival date & time: 11/27/18  2026     History   Chief Complaint Chief Complaint  Patient presents with  . Hyperglycemia  . Chest Pain  . Abdominal Pain    HPI Nancy [name] is a 18 y.o. female with PMH significant for T1DM, gastroparesis, depression/anxiety, CHF, ?seizures vs conversion d/o who presents for 3 day h/o nausea and multiple episodes of vomiting dark blood with associated generalized abdominal pain, worse in epigastric region. She denies any new or undercooked foods. No sick contacts or fevers. Stools have been soft and normal, no blood. Normally has a BM twice per week. She has had difficulties tolerating PO in the last few days, last ate 2 days ago. Denies NSAID use. Is not on blood thinners. Denies current alcohol use. Also with L sided chest pain, stabbing and squeezing that has been persistent since [facility] earlier this month. She is a never smoker. No FH of early cardiac disease. Reports CBGs are normally in the 200s but earlier today her CBG read "high." She has not missed any doses of home insulin. She reports she has recently had a couple of boils in her genital area as well as one on her L breast, resolved with antibiotics. Denies fevers, cough, congestion.    Also reporting ongoing seizures, last seizure was full body shaking earlier today witnessed by mom per patient with bladder incontinence. Last seizure earlier tonight ~7:45pm. Reports has been having seizures daily since September. She reports compliance with home lamictal and topamax. Reports keppra was discontinued about a month ago. She reports she last saw primary neurologist 3 weeks ago. Recently [facility]. Denies vision changes but reports she has been having difficulties getting words out.     Past Medical History:  Diagnosis Date  . Acanthosis nigricans    . Anxiety   . CHF (congestive heart failure) (HCC)   . Chronic lower back pain   . Depression   . DKA, type 1 (HCC) 09/13/2018  . Dyspepsia   . Obesity   . Ovarian cyst    pt is not aware of this hx (11/24/2017)  . Pre-diabetes   . 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 Premature baby   . Seizures (HCC)   . Type II diabetes mellitus (HCC)    insulin dependant    Patient Active Problem List   Diagnosis Date Noted  . 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  . MDD (major depressive disorder), recurrent episode, severe (HCC) 11/06/2018  . Nonspecific abnormal electrocardiogram (ECG) (EKG)   . Chest pain of uncertain etiology   . DKA, type 1 (HCC) 10/08/2018  . Hypertensive urgency 10/08/2018  . Conversion disorder with attacks or seizures, acute episode, with psychological stressor 09/16/2018  . Suicidal ideations 09/13/2018  . MDD (major depressive disorder), recurrent severe, without psychosis (HCC)   . Syncope 01/30/2018  . Orthostatic hypotension 01/24/2018  . Chronic abdominal pain 12/24/2017  . Chest pain 12/19/2017  . Nausea and vomiting 08/21/2017  . Generalized abdominal pain 08/21/2017  . Non compliance with medical treatment 01/27/2012  . Adjustment disorder 09/16/2011  . DM (diabetes mellitus), type 1, uncontrolled (HCC) 09/15/2011  . Acanthosis nigricans   . Goiter   . Obesity 06/14/2010  . Hypertension 06/14/2010    Past Surgical History:  Procedure Laterality Date  . ABDOMINAL HERNIA REPAIR     "  I was a baby"  . BIOPSY  10/12/2018   Procedure: BIOPSY;  Surgeon: Lynann BolognaGupta, Rajesh, MD;  [facility];  Service: Endoscopy;;  . ESOPHAGOGASTRODUODENOSCOPY (EGD) WITH PROPOFOL N/A 10/12/2018   Procedure: ESOPHAGOGASTRODUODENOSCOPY (EGD) WITH PROPOFOL;  Surgeon: Lynann BolognaGupta, Rajesh, MD;  [facility];  Service: Endoscopy;  Laterality: N/A;  . HERNIA REPAIR    . LEFT HEART CATH AND CORONARY ANGIOGRAPHY N/A 10/13/2018    Procedure: LEFT HEART CATH AND CORONARY ANGIOGRAPHY;  Surgeon: Kathleene HazelMcAlhany, Christopher D, MD;  Location: [facility] INVASIVE CV LAB;  Service: Cardiovascular;  Laterality: N/A;  . TONSILLECTOMY AND ADENOIDECTOMY    . WISDOM TOOTH EXTRACTION  2017     OB History   No obstetric history on file.      Home Medications    Prior to Admission medications   Medication Sig Start Date End Date Taking? Authorizing Provider  amLODipine (NORVASC) 5 MG tablet Take 1 tablet (5 mg total) by mouth daily. 10/16/18 11/15/18  Jerald Kiefhiu, Stephen K, MD  atorvastatin (LIPITOR) 20 MG tablet Take 1 tablet (20 mg total) by mouth daily at 6 PM. 10/05/18   Storm FriskWright, Patrick E, MD  hydrOXYzine (ATARAX/VISTARIL) 50 MG tablet Take 1 tablet (50 mg total) by mouth 3 (three) times daily as needed for anxiety. 11/11/18   Armandina StammerNwoko, Agnes I, NP  insulin glargine (LANTUS) 100 UNIT/ML injection Inject 0.6 mLs (60 Units total) into the skin at bedtime. For diabetes management 11/11/18   Armandina StammerNwoko, Agnes I, NP  insulin lispro (HUMALOG) 100 UNIT/ML injection Inject 0.15 mLs (15 Units total) into the skin 2 (two) times daily with a meal. 10/05/18   Storm FriskWright, Patrick E, MD  lamoTRIgine (LAMICTAL) 25 MG tablet Take 2 tablets (50 mg total) by mouth 2 (two) times daily. For mood stabilization 11/11/18   Armandina StammerNwoko, Agnes I, NP  lubiprostone (AMITIZA) 24 MCG capsule Take 1 capsule (24 mcg total) by mouth 2 (two) times daily with a meal. 10/27/18   Esterwood, Amy S, PA-C  metoCLOPramide (REGLAN) 10 MG tablet Take 1 tablet (10 mg total) by mouth 4 (four) times daily -  before meals and at bedtime. 10/15/18 11/14/18  Jerald Kiefhiu, Stephen K, MD  mirtazapine (REMERON) 7.5 MG tablet Take 1 tablet (7.5 mg total) by mouth at bedtime. For depression/sleep 11/11/18   Armandina StammerNwoko, Agnes I, NP  Multiple Vitamin (MULTIVITAMIN WITH MINERALS) TABS tablet Take 1 tablet by mouth daily. 09/18/18   Clapacs, Jackquline DenmarkJohn T, MD  pantoprazole (PROTONIX) 40 MG tablet Take 1 tablet (40 mg total) by mouth 2 (two) times  daily. For acid reflux 11/11/18   Armandina StammerNwoko, Agnes I, NP  propranolol (INDERAL) 20 MG tablet Take 1 tablet (20 mg total) by mouth 3 (three) times daily. For anxiety/HTN 11/11/18   Armandina StammerNwoko, Agnes I, NP  QUEtiapine (SEROQUEL) 100 MG tablet Take 1 tablet (100 mg total) by mouth at bedtime. For mood control 11/11/18   Armandina StammerNwoko, Agnes I, NP  QUEtiapine (SEROQUEL) 25 MG tablet Take 1 tablet (25 mg total) by mouth 2 (two) times daily. For agitation 11/11/18   Armandina StammerNwoko, Agnes I, NP  topiramate (TOPAMAX) 25 MG tablet Take 1 tablet (25 mg total) by mouth 2 (two) times daily. For seizure activities 11/11/18   Sanjuana KavaNwoko, Agnes I, NP    Family History Family History  Problem Relation Age of Onset  . Diabetes Mother   . Hypertension Mother   . Obesity Mother   . Asthma Mother   . Allergic rhinitis Mother   . Eczema Mother   .  Cervical cancer Mother   . Diabetes Father   . Hypertension Father   . Obesity Father   . Hyperlipidemia Father   . Hypertension Paternal Aunt   . Hypertension Maternal Grandfather   . Colon cancer Maternal Grandfather   . Diabetes Paternal Grandmother   . Obesity Paternal Grandmother   . Diabetes Paternal Grandfather   . Obesity Paternal Grandfather   . Angioedema Neg Hx   . Immunodeficiency Neg Hx   . Urticaria Neg Hx   . Stomach cancer Neg Hx   . Esophageal cancer Neg Hx     Social History Social History   Tobacco Use  . Smoking status: Never Smoker  . Smokeless tobacco: Never Used  Substance Use Topics  . Alcohol use: No    Alcohol/week: 0.0 standard drinks  . Drug use: No    Allergies   Ibuprofen and Oatmeal  Review of Systems Review of Systems  Constitutional: Negative for fever.  HENT: Negative for congestion, rhinorrhea and sore throat.   Respiratory: Negative for cough and shortness of breath.   Cardiovascular: Positive for chest pain, palpitations and leg swelling.  Gastrointestinal: Positive for abdominal pain, nausea and vomiting. Negative for blood in stool,  constipation and diarrhea.  Endocrine: Positive for polydipsia and polyuria.  Genitourinary: Negative for dysuria.  Skin: Negative for rash.  Neurological: Positive for dizziness, seizures, light-headedness and headaches.  Psychiatric/Behavioral: Positive for sleep disturbance. Negative for suicidal ideas.    Physical Exam Updated Vital Signs BP 137/90   Pulse (!) 135   Temp 99 F (37.2 C)   Resp (!) 29   Ht 5\' 3"  (1.6 m)   Wt 89.8 kg   SpO2 100%   BMI 35.07 kg/m   Physical Exam Vitals signs reviewed.  HENT:     Head: Normocephalic.  Eyes:     Extraocular Movements: Extraocular movements intact.     Pupils: Pupils are equal, round, and reactive to light.  Cardiovascular:     Rate and Rhythm: Regular rhythm. Tachycardia present.     Heart sounds: No murmur.  Pulmonary:     Effort: Pulmonary effort is normal. No tachypnea or accessory muscle usage.     Breath sounds: Normal breath sounds. No wheezing or rales.  Abdominal:     General: Bowel sounds are normal.     Palpations: Abdomen is soft.     Tenderness: There is generalized abdominal tenderness.  Musculoskeletal:     Right lower leg: No edema.     Left lower leg: No edema.  Neurological:     General: No focal deficit present.     Mental Status: She is alert and oriented to person, place, and time.     Cranial Nerves: No cranial nerve deficit.     Motor: No weakness.     ED Treatments / Results  Labs (all labs ordered are listed, but only abnormal results are displayed) Labs Reviewed  BASIC METABOLIC PANEL - Abnormal; Notable for the following components:      Result Value   Sodium 125 (*)    Chloride 95 (*)    CO2 15 (*)    Glucose, Bld 861 (*)    Calcium 8.7 (*)    All other components within normal limits  URINALYSIS, ROUTINE W REFLEX MICROSCOPIC - Abnormal; Notable for the following components:   Color, Urine STRAW (*)    APPearance HAZY (*)    Glucose, UA >=500 (*)    Bacteria, UA FEW (*)  All  other components within normal limits  HEPATIC FUNCTION PANEL - Abnormal; Notable for the following components:   Total Protein 6.3 (*)    Indirect Bilirubin 0.2 (*)    All other components within normal limits  CBG MONITORING, ED - Abnormal; Notable for the following components:   Glucose-Capillary >600 (*)    All other components within normal limits  CBG MONITORING, ED - Abnormal; Notable for the following components:   Glucose-Capillary 386 (*)    All other components within normal limits  SARS CORONAVIRUS 2 (TAT 6-24 HRS)  CBC  LIPASE, BLOOD  BASIC METABOLIC PANEL  BASIC METABOLIC PANEL  BASIC METABOLIC PANEL  BASIC METABOLIC PANEL  BETA-HYDROXYBUTYRIC ACID  BETA-HYDROXYBUTYRIC ACID  I-STAT BETA HCG BLOOD, ED ([facility], [facility], [facility] ONLY)  I-STAT VENOUS BLOOD GAS, ED  TROPONIN I (HIGH SENSITIVITY)    EKG EKG Interpretation  Date/Time:  Saturday November 27 2018 20:36:08 EST Ventricular Rate:  129 PR Interval:    QRS Duration: 89 QT Interval:  323 QTC Calculation: 474 R Axis:   49 Text Interpretation: Sinus tachycardia ST elev, probable normal early repol pattern Confirmed by Blane Ohara [phone]) on 11/27/2018 10:01:36 PM   Radiology No results found.  Procedures Procedures (including critical care time)  Medications Ordered in ED Medications  sodium chloride flush (NS) 0.9 % injection 3 mL (has no administration in time range)  insulin regular, human (MYXREDLIN) 100 units/ 100 mL infusion (has no administration in time range)  0.9 %  sodium chloride infusion ( Intravenous New Bag/Given 11/27/18 2315)  dextrose 5 %-0.45 % sodium chloride infusion (has no administration in time range)  dextrose 50 % solution 0-50 mL (has no administration in time range)  potassium chloride 10 mEq in 100 mL IVPB (10 mEq Intravenous New Bag/Given 11/27/18 2318)  nystatin (MYCOSTATIN) 100000 UNIT/ML suspension 500,000 Units (has no administration in time range)  ondansetron (ZOFRAN)  injection 4 mg (4 mg Intravenous Given 11/27/18 2207)  sodium chloride 0.9 % bolus 1,000 mL (1,000 mLs Intravenous New Bag/Given 11/27/18 2207)  insulin aspart (novoLOG) injection 15 Units (15 Units Intravenous Given 11/27/18 2203)  morphine 4 MG/ML injection 4 mg (4 mg Intravenous Given 11/27/18 2309)  promethazine (PHENERGAN) injection 12.5 mg (12.5 mg Intravenous Given 11/27/18 2306)     Initial Impression / Assessment and Plan / ED Course  I have reviewed the triage vital signs and the nursing notes.  Pertinent labs & imaging results that were available during my care of the patient were reviewed by me and considered in my medical decision making (see chart for details).   18yo F w/ h/o T1DM, gastroparesis, depression/anxiety, CHF, ?seizures vs conversion d/o who presents for 3 day h/o generalized abdominal pain, nausea, and vomiting with dark blood with >600 CBG on presentation. Vitals notable for tachycardia, soft BP with systolic in 90s. On exam, patient with abdominal tenderness diffusely though without rebound or guarding.  Neurologically intact without focal deficit. Glucose on BMP 861 with bicarb 15 concerning for DKA. Recent infection with genital boils likely precipitating factor given patient reports compliance with home insulin though notably has poor control with last A1c 10.2 11/07/18.  Lipase and LFTs wnl not suggestive of concomitant pancreatitis or gallbladder pathology. Recent EGD 10/2018 notable for mild candida and gastritis, followed by GI for gastroparesis. Troponin negative, EKG notable for sinus tachycardia with early repolarization. IVF bolus provided in ED and started on insulin drip.   Patient remained neurologically intact without witnessed seizure activity  in ED, given she recently saw primary neurology, previous extensive negative seizure workup and symptoms have been ongoing for months with also possible consideration of conversion disorder, likely not contributing to  current DKA episode.   Discussed with hospitalist who will admit for DKA.    Final Clinical Impressions(s) / ED Diagnoses   Final diagnoses:  Type 1 diabetes mellitus with ketoacidosis without coma [facility])    ED Discharge Orders    None       Ellwood Dense, DO 11/27/18 2321    Blane Ohara, MD 11/27/18 [phone]

## 2018-11-27 NOTE — ED Triage Notes (Signed)
The pt arrived by [facility] from home wherfe she thinks she had a seizure  Hx seizures  She also has multiple other complaints. shes a diabetic and the cbg of ems read high coffeeground emesis for 3 days  None observed chest pain also   Nausea no vomiting

## 2018-11-27 NOTE — H&P (Addendum)
History and Physical    Nancy [name] SKA:[id number] DOB: 2000-09-05 DOA: 11/27/2018  PCP: Claiborne Rigg, NP   Patient coming from: Home   Chief Complaint: abdominal pain, malaise, N/V   HPI: Nancy [name] is a 18 y.o. female with medical history significant for type 1 diabetes mellitus, seizures and/or pseudoseizures, depression with anxiety, hypertension, and chronic pain, now presenting to the emergency department with abdominal pain, general malaise, nausea, vomiting, and possible seizure.  Patient reports that she developed increase in her chronic abdominal pain approximately 3 days ago and has had increase in her chronic nausea and vomiting over the same interval.  She reports seeing some dark gritty material in the vomitus.  She has had some loose stools but denies diarrhea per se.  She denies any fevers, chills, cough, or shortness of breath.  Her chronic chest pain has not been bothering her lately.  She felt as though she was having a seizure earlier today, denies any injury associated with this, denies incontinence, and states that there is no witnesses.  She called EMS and was found to have CBG "high," was treated with 250 cc of IV fluid, and brought into the ED.  ED Course: Upon arrival to the ED, patient is found to be afebrile, saturating well on room air, mildly tachypneic, tachycardic to the 130s, and with stable blood pressure.  EKG features sinus tachycardia with rate 129 and nonspecific ST abnormality that is unchanged from prior.  Chemistry panel is concerning for a glucose of 861 with bicarbonate of 15 and anion gap 15.  CBC is unremarkable.  High-sensitivity troponin is undetectable.  Lipase was normal.  Urinalysis notable for glucose urea but no ketones.  Patient was given a liter of normal saline, morphine, Zofran, IV potassium, IV NovoLog bolus, and was started on insulin infusion in the ED.  COVID-19 screening test has not yet resulted.  Review of Systems:  All  other systems reviewed and apart from HPI, are negative.  Past Medical History:  Diagnosis Date  . Acanthosis nigricans   . Anxiety   . CHF (congestive heart failure) (HCC)   . Chronic lower back pain   . Depression   . DKA, type 1 (HCC) 09/13/2018  . Dyspepsia   . Obesity   . Ovarian cyst    pt is not aware of this hx (11/24/2017)  . Pre-diabetes   . 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 Premature baby   . Seizures (HCC)   . Type II diabetes mellitus (HCC)    insulin dependant    Past Surgical History:  Procedure Laterality Date  . ABDOMINAL HERNIA REPAIR     "I was a baby"  . BIOPSY  10/12/2018   Procedure: BIOPSY;  Surgeon: Lynann Bologna, MD;  Location: [facility] ENDOSCOPY;  Service: Endoscopy;;  . ESOPHAGOGASTRODUODENOSCOPY (EGD) WITH PROPOFOL N/A 10/12/2018   Procedure: ESOPHAGOGASTRODUODENOSCOPY (EGD) WITH PROPOFOL;  Surgeon: Lynann Bologna, MD;  Location[facility] ENDOSCOPY;  Service: Endoscopy;  Laterality: N/A;  . HERNIA REPAIR    . LEFT HEART CATH AND CORONARY ANGIOGRAPHY N/A 10/13/2018   Procedure: LEFT HEART CATH AND CORONARY ANGIOGRAPHY;  Surgeon: Kathleene Hazel, MD;  Location: [facility] INVASIVE CV LAB;  Service: Cardiovascular;  Laterality: N/A;  . TONSILLECTOMY AND ADENOIDECTOMY    . WISDOM TOOTH EXTRACTION  2017     reports that she has never smoked. She has never used smokeless tobacco. She reports that she does not drink alcohol or use drugs.  Allergies  Allergen Reactions  . Ibuprofen Other (See Comments)    GI MD said to not take this anymore  . Oatmeal     Family History  Problem Relation Age of Onset  . Diabetes Mother   . Hypertension Mother   . Obesity Mother   . Asthma Mother   . Allergic rhinitis Mother   . Eczema Mother   . Cervical cancer Mother   . Diabetes Father   . Hypertension Father   . Obesity Father   . Hyperlipidemia Father   . Hypertension Paternal Aunt   . Hypertension Maternal Grandfather   . Colon cancer Maternal Grandfather   .  Diabetes Paternal Grandmother   . Obesity Paternal Grandmother   . Diabetes Paternal Grandfather   . Obesity Paternal Grandfather   . Angioedema Neg Hx   . Immunodeficiency Neg Hx   . Urticaria Neg Hx   . Stomach cancer Neg Hx   . Esophageal cancer Neg Hx      Prior to Admission medications   Medication Sig Start Date End Date Taking? Authorizing Provider  amLODipine (NORVASC) 5 MG tablet Take 1 tablet (5 mg total) by mouth daily. 10/16/18 11/15/18  Donne Hazel, MD  atorvastatin (LIPITOR) 20 MG tablet Take 1 tablet (20 mg total) by mouth daily at 6 PM. 10/05/18   Elsie Stain, MD  hydrOXYzine (ATARAX/VISTARIL) 50 MG tablet Take 1 tablet (50 mg total) by mouth 3 (three) times daily as needed for anxiety. 11/11/18   Lindell Spar I, NP  insulin glargine (LANTUS) 100 UNIT/ML injection Inject 0.6 mLs (60 Units total) into the skin at bedtime. For diabetes management 11/11/18   Lindell Spar I, NP  insulin lispro (HUMALOG) 100 UNIT/ML injection Inject 0.15 mLs (15 Units total) into the skin 2 (two) times daily with a meal. 10/05/18   Elsie Stain, MD  lamoTRIgine (LAMICTAL) 25 MG tablet Take 2 tablets (50 mg total) by mouth 2 (two) times daily. For mood stabilization 11/11/18   Lindell Spar I, NP  lubiprostone (AMITIZA) 24 MCG capsule Take 1 capsule (24 mcg total) by mouth 2 (two) times daily with a meal. 10/27/18   Esterwood, Amy S, PA-C  metoCLOPramide (REGLAN) 10 MG tablet Take 1 tablet (10 mg total) by mouth 4 (four) times daily -  before meals and at bedtime. 10/15/18 11/14/18  Donne Hazel, MD  mirtazapine (REMERON) 7.5 MG tablet Take 1 tablet (7.5 mg total) by mouth at bedtime. For depression/sleep 11/11/18   Lindell Spar I, NP  Multiple Vitamin (MULTIVITAMIN WITH MINERALS) TABS tablet Take 1 tablet by mouth daily. 09/18/18   Clapacs, Madie Reno, MD  pantoprazole (PROTONIX) 40 MG tablet Take 1 tablet (40 mg total) by mouth 2 (two) times daily. For acid reflux 11/11/18   Lindell Spar I, NP   propranolol (INDERAL) 20 MG tablet Take 1 tablet (20 mg total) by mouth 3 (three) times daily. For anxiety/HTN 11/11/18   Lindell Spar I, NP  QUEtiapine (SEROQUEL) 100 MG tablet Take 1 tablet (100 mg total) by mouth at bedtime. For mood control 11/11/18   Lindell Spar I, NP  QUEtiapine (SEROQUEL) 25 MG tablet Take 1 tablet (25 mg total) by mouth 2 (two) times daily. For agitation 11/11/18   Lindell Spar I, NP  topiramate (TOPAMAX) 25 MG tablet Take 1 tablet (25 mg total) by mouth 2 (two) times daily. For seizure activities 11/11/18   Encarnacion Slates, NP    Physical Exam: Vitals:   11/27/18  2045 11/27/18 2215 11/27/18 2230 11/27/18 2245  BP: 92/70 123/90 (!) 128/94 137/90  Pulse: (!) 135     Resp: (!) 24 (!) 25 (!) 25 (!) 29  Temp:      SpO2: 100%     Weight:      Height:        Constitutional: NAD, calm  Eyes: PERTLA, lids and conjunctivae normal ENMT: Mucous membranes are moist. Posterior pharynx clear of any exudate or lesions.   Neck: normal, supple, no masses, no thyromegaly Respiratory:  no wheezing, no crackles. Normal respiratory effort. No accessory muscle use.  Cardiovascular: Rate ~120 and regular. No extremity edema.   Abdomen: No distension, soft, generalized tenderness, no rebound pain or guarding. Bowel sounds active.  Musculoskeletal: no clubbing / cyanosis. No joint deformity upper and lower extremities.     Skin: no significant rashes, lesions, ulcers. Warm, dry, well-perfused. Neurologic: No facial asymmetry. Sensation intact. Moving all extremities.  Psychiatric: Alert and oriented to person, place, and situation. Calm, cooperative.       Labs on Admission: I have personally reviewed following labs and imaging studies  CBC: Recent Labs  Lab 11/27/18 2057  WBC 9.3  HGB 12.1  HCT 38.4  MCV 87.5  PLT 283   Basic Metabolic Panel: Recent Labs  Lab 11/27/18 2057  NA 125*  K 3.6  CL 95*  CO2 15*  GLUCOSE 861*  BUN 9  CREATININE 1.00  CALCIUM 8.7*   GFR:  Estimated Creatinine Clearance: 95.5 mL/min (by C-G formula based on SCr of 1 mg/dL). Liver Function Tests: Recent Labs  Lab 11/27/18 2135  AST 21  ALT 13  ALKPHOS 95  BILITOT 0.3  PROT 6.3*  ALBUMIN 3.6   Recent Labs  Lab 11/27/18 2057  LIPASE 26   No results for input(s): AMMONIA in the last 168 hours. Coagulation Profile: No results for input(s): INR, PROTIME in the last 168 hours. Cardiac Enzymes: No results for input(s): CKTOTAL, CKMB, CKMBINDEX, TROPONINI in the last 168 hours. BNP (last 3 results) No results for input(s): PROBNP in the last 8760 hours. HbA1C: No results for input(s): HGBA1C in the last 72 hours. CBG: Recent Labs  Lab 11/27/18 2053 11/27/18 2304  GLUCAP >600* 386*   Lipid Profile: No results for input(s): CHOL, HDL, LDLCALC, TRIG, CHOLHDL, LDLDIRECT in the last 72 hours. Thyroid Function Tests: No results for input(s): TSH, T4TOTAL, FREET4, T3FREE, THYROIDAB in the last 72 hours. Anemia Panel: No results for input(s): VITAMINB12, FOLATE, FERRITIN, TIBC, IRON, RETICCTPCT in the last 72 hours. Urine analysis:    Component Value Date/Time   COLORURINE STRAW (A) 11/27/2018 2105   APPEARANCEUR HAZY (A) 11/27/2018 2105   LABSPEC 1.030 11/27/2018 2105   PHURINE 6.0 11/27/2018 2105   GLUCOSEU >=500 (A) 11/27/2018 2105   HGBUR NEGATIVE 11/27/2018 2105   BILIRUBINUR NEGATIVE 11/27/2018 2105   BILIRUBINUR negative 07/13/2018 1651   KETONESUR NEGATIVE 11/27/2018 2105   PROTEINUR NEGATIVE 11/27/2018 2105   UROBILINOGEN 0.2 07/13/2018 1651   UROBILINOGEN 1.0 07/09/2017 1324   NITRITE NEGATIVE 11/27/2018 2105   LEUKOCYTESUR NEGATIVE 11/27/2018 2105   Sepsis Labs: @LABRCNTIP (procalcitonin:4,lacticidven:4) )No results found for this or any previous visit (from the past 240 hour(s)).   Radiological Exams on Admission: No results found.  EKG: Independently reviewed. Sinus tachycardia (rate 129), STE probable early repol pattern, not significantly  changed from prior.   Assessment/Plan   1. Uncontrolled type I DM with hyperglycemia, possible DKA  - Presents with increased abdominal  pain, N/V, and is found to have serum glucose of 861 with bicarb 15, normal AG, no urine ketones, and beta-hydroxybutyrate pending   - She reports adherence with insulin regimen, denies illicit substance use, and does not appear to have any severe acute illness as precipitant  - A1c was 10.2% earlier this month  - She was fluid-resuscitated with NS in ED and started on insulin infusion  - Continue IVF hydration, continue insulin infusion with serial chem panels and frequent CBG's, and convert back to sq insulin once glucose controlled, bicarb normalized, and she is tolerating diet, consult diabetes coordinator   2. Abdominal pain, N/V  - Patient has chronic abdominal pain, frequent N/V, and reports N/V for the past 3 days with some coffee-ground material  - Abdominal exam benign  - She is not vomiting in ED and is asking to eat so will try to manage pain with oral analgesics only, continue antiemetics, continue her twice-daily PPI, avoid NSAID   3. Depression, anxiety  - Continue Seroquel, Remeron, Lamictal, Inderal, prn Vistaril     4. Seizures  - Patient has hx of seizures and/or pseudoseizures  - Continue Topomax    5. Hypertension  - BP low-normal in ED and Norvasc held on admission, Inderal continued with holding parameters     PPE: mask, face shield  DVT prophylaxis: SCD's  Code Status: Full  Family Communication: Discussed with patient  Consults called: None Admission status: Observation     Briscoe Deutscher, MD [facility] Pager [phone]  If 7PM-7AM, please contact night-coverage [email or web address] Password [email or web address]  11/27/2018, 11:59 PM

## 2018-11-27 NOTE — ED Notes (Signed)
Ems gved 259ml nss on the way  here

## 2018-11-28 ENCOUNTER — Other Ambulatory Visit: Payer: Self-pay

## 2018-11-28 DIAGNOSIS — R55 Syncope and collapse: Secondary | ICD-10-CM | POA: Diagnosis not present

## 2018-11-28 DIAGNOSIS — Z800 Family history of malignant neoplasm of digestive organs: Secondary | ICD-10-CM | POA: Diagnosis not present

## 2018-11-28 DIAGNOSIS — E1065 Type 1 diabetes mellitus with hyperglycemia: Secondary | ICD-10-CM | POA: Diagnosis present

## 2018-11-28 DIAGNOSIS — F445 Conversion disorder with seizures or convulsions: Secondary | ICD-10-CM | POA: Diagnosis not present

## 2018-11-28 DIAGNOSIS — E1043 Type 1 diabetes mellitus with diabetic autonomic (poly)neuropathy: Secondary | ICD-10-CM | POA: Diagnosis present

## 2018-11-28 DIAGNOSIS — E118 Type 2 diabetes mellitus with unspecified complications: Secondary | ICD-10-CM | POA: Diagnosis not present

## 2018-11-28 DIAGNOSIS — E1010 Type 1 diabetes mellitus with ketoacidosis without coma: Secondary | ICD-10-CM

## 2018-11-28 DIAGNOSIS — Z20828 Contact with and (suspected) exposure to other viral communicable diseases: Secondary | ICD-10-CM | POA: Diagnosis present

## 2018-11-28 DIAGNOSIS — E871 Hypo-osmolality and hyponatremia: Secondary | ICD-10-CM | POA: Diagnosis present

## 2018-11-28 DIAGNOSIS — K3184 Gastroparesis: Secondary | ICD-10-CM | POA: Diagnosis present

## 2018-11-28 DIAGNOSIS — F4329 Adjustment disorder with other symptoms: Secondary | ICD-10-CM | POA: Diagnosis not present

## 2018-11-28 DIAGNOSIS — N179 Acute kidney failure, unspecified: Secondary | ICD-10-CM | POA: Diagnosis not present

## 2018-11-28 DIAGNOSIS — F419 Anxiety disorder, unspecified: Secondary | ICD-10-CM | POA: Diagnosis present

## 2018-11-28 DIAGNOSIS — M549 Dorsalgia, unspecified: Secondary | ICD-10-CM | POA: Diagnosis present

## 2018-11-28 DIAGNOSIS — I509 Heart failure, unspecified: Secondary | ICD-10-CM | POA: Diagnosis present

## 2018-11-28 DIAGNOSIS — M545 Low back pain: Secondary | ICD-10-CM | POA: Diagnosis present

## 2018-11-28 DIAGNOSIS — K5900 Constipation, unspecified: Secondary | ICD-10-CM | POA: Diagnosis present

## 2018-11-28 DIAGNOSIS — F332 Major depressive disorder, recurrent severe without psychotic features: Secondary | ICD-10-CM | POA: Diagnosis present

## 2018-11-28 DIAGNOSIS — Z79899 Other long term (current) drug therapy: Secondary | ICD-10-CM | POA: Diagnosis not present

## 2018-11-28 DIAGNOSIS — G8929 Other chronic pain: Secondary | ICD-10-CM | POA: Diagnosis present

## 2018-11-28 DIAGNOSIS — Z8249 Family history of ischemic heart disease and other diseases of the circulatory system: Secondary | ICD-10-CM | POA: Diagnosis not present

## 2018-11-28 DIAGNOSIS — Z886 Allergy status to analgesic agent status: Secondary | ICD-10-CM | POA: Diagnosis not present

## 2018-11-28 DIAGNOSIS — Z833 Family history of diabetes mellitus: Secondary | ICD-10-CM | POA: Diagnosis not present

## 2018-11-28 DIAGNOSIS — Z825 Family history of asthma and other chronic lower respiratory diseases: Secondary | ICD-10-CM | POA: Diagnosis not present

## 2018-11-28 DIAGNOSIS — E1165 Type 2 diabetes mellitus with hyperglycemia: Secondary | ICD-10-CM | POA: Diagnosis present

## 2018-11-28 DIAGNOSIS — Z8049 Family history of malignant neoplasm of other genital organs: Secondary | ICD-10-CM | POA: Diagnosis not present

## 2018-11-28 DIAGNOSIS — Z8349 Family history of other endocrine, nutritional and metabolic diseases: Secondary | ICD-10-CM | POA: Diagnosis not present

## 2018-11-28 DIAGNOSIS — I110 Hypertensive heart disease with heart failure: Secondary | ICD-10-CM | POA: Diagnosis present

## 2018-11-28 LAB — CBC
HCT: 33.6 % — ABNORMAL LOW (ref 36.0–46.0)
Hemoglobin: 10.8 g/dL — ABNORMAL LOW (ref 12.0–15.0)
MCH: 27.1 pg (ref 26.0–34.0)
MCHC: 32.1 g/dL (ref 30.0–36.0)
MCV: 84.4 fL (ref 80.0–100.0)
Platelets: 251 10*3/uL (ref 150–400)
RBC: 3.98 MIL/uL (ref 3.87–5.11)
RDW: 13.2 % (ref 11.5–15.5)
WBC: 8.3 10*3/uL (ref 4.0–10.5)
nRBC: 0 % (ref 0.0–0.2)

## 2018-11-28 LAB — BASIC METABOLIC PANEL
Anion gap: 13 (ref 5–15)
Anion gap: 15 (ref 5–15)
Anion gap: 7 (ref 5–15)
Anion gap: 8 (ref 5–15)
BUN: 7 mg/dL (ref 6–20)
BUN: 7 mg/dL (ref 6–20)
BUN: 8 mg/dL (ref 6–20)
BUN: 8 mg/dL (ref 6–20)
CO2: 18 mmol/L — ABNORMAL LOW (ref 22–32)
CO2: 21 mmol/L — ABNORMAL LOW (ref 22–32)
CO2: 23 mmol/L (ref 22–32)
CO2: 23 mmol/L (ref 22–32)
Calcium: 8 mg/dL — ABNORMAL LOW (ref 8.9–10.3)
Calcium: 8.1 mg/dL — ABNORMAL LOW (ref 8.9–10.3)
Calcium: 9.1 mg/dL (ref 8.9–10.3)
Calcium: 9.2 mg/dL (ref 8.9–10.3)
Chloride: 101 mmol/L (ref 98–111)
Chloride: 102 mmol/L (ref 98–111)
Chloride: 103 mmol/L (ref 98–111)
Chloride: 104 mmol/L (ref 98–111)
Creatinine, Ser: 0.56 mg/dL (ref 0.44–1.00)
Creatinine, Ser: 0.64 mg/dL (ref 0.44–1.00)
Creatinine, Ser: 0.78 mg/dL (ref 0.44–1.00)
Creatinine, Ser: 0.87 mg/dL (ref 0.44–1.00)
GFR calc Af Amer: >60 mL/min (ref >60)
GFR calc Af Amer: >60 mL/min (ref >60)
GFR calc Af Amer: >60 mL/min (ref >60)
GFR calc Af Amer: >60 mL/min (ref >60)
GFR calc non Af Amer: >60 mL/min (ref >60)
GFR calc non Af Amer: >60 mL/min (ref >60)
GFR calc non Af Amer: >60 mL/min (ref >60)
GFR calc non Af Amer: >60 mL/min (ref >60)
Glucose, Bld: 332 mg/dL — ABNORMAL HIGH (ref 70–99)
Glucose, Bld: 390 mg/dL — ABNORMAL HIGH (ref 70–99)
Glucose, Bld: 393 mg/dL — ABNORMAL HIGH (ref 70–99)
Glucose, Bld: 473 mg/dL — ABNORMAL HIGH (ref 70–99)
Potassium: 3.6 mmol/L (ref 3.5–5.1)
Potassium: 3.8 mmol/L (ref 3.5–5.1)
Potassium: 4.1 mmol/L (ref 3.5–5.1)
Potassium: 4.1 mmol/L (ref 3.5–5.1)
Sodium: 134 mmol/L — ABNORMAL LOW (ref 135–145)
Sodium: 134 mmol/L — ABNORMAL LOW (ref 135–145)
Sodium: 135 mmol/L (ref 135–145)
Sodium: 135 mmol/L (ref 135–145)

## 2018-11-28 LAB — POCT I-STAT EG7
Acid-base deficit: 3 mmol/L — ABNORMAL HIGH (ref 0.0–2.0)
Bicarbonate: 22.7 mmol/L (ref 20.0–28.0)
Calcium, Ion: 1.18 mmol/L (ref 1.15–1.40)
HCT: 39 % (ref 36.0–46.0)
Hemoglobin: 13.3 g/dL (ref 12.0–15.0)
O2 Saturation: 99 %
Potassium: 3.8 mmol/L (ref 3.5–5.1)
Sodium: 136 mmol/L (ref 135–145)
TCO2: 24 mmol/L (ref 22–32)
pCO2, Ven: 41.9 mmHg — ABNORMAL LOW (ref 44.0–60.0)
pH, Ven: 7.342 (ref 7.250–7.430)
pO2, Ven: 161 mmHg — ABNORMAL HIGH (ref 32.0–45.0)

## 2018-11-28 LAB — SARS CORONAVIRUS 2 (TAT 6-24 HRS): SARS Coronavirus 2: NEGATIVE

## 2018-11-28 LAB — CBG MONITORING, ED
Glucose-Capillary: 156 mg/dL — ABNORMAL HIGH (ref 70–99)
Glucose-Capillary: 188 mg/dL — ABNORMAL HIGH (ref 70–99)
Glucose-Capillary: 220 mg/dL — ABNORMAL HIGH (ref 70–99)
Glucose-Capillary: 334 mg/dL — ABNORMAL HIGH (ref 70–99)
Glucose-Capillary: 440 mg/dL — ABNORMAL HIGH (ref 70–99)

## 2018-11-28 LAB — HIV ANTIBODY (ROUTINE TESTING W REFLEX): HIV Screen 4th Generation wRfx: NONREACTIVE

## 2018-11-28 LAB — BETA-HYDROXYBUTYRIC ACID: Beta-Hydroxybutyric Acid: 0.09 mmol/L (ref 0.05–0.27)

## 2018-11-28 LAB — GLUCOSE, CAPILLARY: Glucose-Capillary: 327 mg/dL — ABNORMAL HIGH (ref 70–99)

## 2018-11-28 MED ORDER — POLYETHYLENE GLYCOL 3350 17 G PO PACK: 17.0000 g | PACK | Freq: Every day | ORAL | Status: DC | PRN

## 2018-11-28 MED ORDER — LAMOTRIGINE 25 MG PO TABS
50.0000 mg | ORAL_TABLET | Freq: Two times a day (BID) | ORAL | Status: DC
  Administered 2018-11-28 – 2018-11-30 (×5): 50 mg via ORAL
  Filled 2018-11-28 (×6): qty 2

## 2018-11-28 MED ORDER — ACETAMINOPHEN 325 MG PO TABS
650.0000 mg | ORAL_TABLET | Freq: Four times a day (QID) | ORAL | Status: DC | PRN
  Administered 2018-11-28 – 2018-11-29 (×4): 650 mg via ORAL
  Filled 2018-11-28 (×7): qty 2

## 2018-11-28 MED ORDER — HYDROCODONE-ACETAMINOPHEN 5-325 MG PO TABS
1.0000 | ORAL_TABLET | ORAL | Status: DC | PRN
  Administered 2018-11-28: 2 via ORAL
  Filled 2018-11-28: qty 2

## 2018-11-28 MED ORDER — MIRTAZAPINE 15 MG PO TABS
7.5000 mg | ORAL_TABLET | Freq: Every day | ORAL | Status: DC
  Administered 2018-11-28 – 2018-11-29 (×3): 7.5 mg via ORAL
  Filled 2018-11-28 (×4): qty 1

## 2018-11-28 MED ORDER — BISACODYL 10 MG RE SUPP
10.0000 mg | Freq: Once | RECTAL | Status: DC
  Filled 2018-11-28: qty 1

## 2018-11-28 MED ORDER — PROPRANOLOL HCL 20 MG PO TABS
20.0000 mg | ORAL_TABLET | Freq: Three times a day (TID) | ORAL | Status: DC
  Administered 2018-11-28 – 2018-11-30 (×7): 20 mg via ORAL
  Filled 2018-11-28 (×9): qty 1

## 2018-11-28 MED ORDER — QUETIAPINE FUMARATE 25 MG PO TABS
100.0000 mg | ORAL_TABLET | Freq: Every day | ORAL | Status: DC
  Administered 2018-11-28 – 2018-11-29 (×3): 100 mg via ORAL
  Filled 2018-11-28 (×2): qty 1
  Filled 2018-11-28 (×2): qty 4

## 2018-11-28 MED ORDER — HYDROXYZINE HCL 25 MG PO TABS
50.0000 mg | ORAL_TABLET | Freq: Three times a day (TID) | ORAL | Status: DC | PRN
  Administered 2018-11-28 – 2018-11-29 (×2): 50 mg via ORAL
  Filled 2018-11-28 (×2): qty 2

## 2018-11-28 MED ORDER — HYDROMORPHONE HCL 2 MG PO TABS
3.0000 mg | ORAL_TABLET | ORAL | Status: DC | PRN
  Administered 2018-11-28 (×2): 3 mg via ORAL
  Filled 2018-11-28 (×2): qty 2

## 2018-11-28 MED ORDER — QUETIAPINE FUMARATE 25 MG PO TABS
25.0000 mg | ORAL_TABLET | Freq: Two times a day (BID) | ORAL | Status: DC
  Administered 2018-11-28 – 2018-11-30 (×5): 25 mg via ORAL
  Filled 2018-11-28 (×7): qty 1

## 2018-11-28 MED ORDER — ACETAMINOPHEN 650 MG RE SUPP: 650.0000 mg | Freq: Four times a day (QID) | RECTAL | Status: DC | PRN

## 2018-11-28 MED ORDER — INSULIN ASPART 100 UNIT/ML ~~LOC~~ SOLN
0.0000 [IU] | Freq: Three times a day (TID) | SUBCUTANEOUS | Status: DC
  Administered 2018-11-28: 4 [IU] via SUBCUTANEOUS
  Administered 2018-11-28: 7 [IU] via SUBCUTANEOUS
  Administered 2018-11-28: 20 [IU] via SUBCUTANEOUS
  Administered 2018-11-29: 15 [IU] via SUBCUTANEOUS
  Administered 2018-11-29: 20 [IU] via SUBCUTANEOUS
  Administered 2018-11-29: 7 [IU] via SUBCUTANEOUS
  Administered 2018-11-30: 4 [IU] via SUBCUTANEOUS
  Administered 2018-11-30: 11 [IU] via SUBCUTANEOUS

## 2018-11-28 MED ORDER — LACTULOSE 10 GM/15ML PO SOLN
20.0000 g | ORAL | Status: AC
  Administered 2018-11-28: 20 g via ORAL
  Filled 2018-11-28 (×2): qty 30

## 2018-11-28 MED ORDER — ATORVASTATIN CALCIUM 10 MG PO TABS
20.0000 mg | ORAL_TABLET | Freq: Every day | ORAL | Status: DC
  Administered 2018-11-28 – 2018-11-29 (×2): 20 mg via ORAL
  Filled 2018-11-28 (×2): qty 2

## 2018-11-28 MED ORDER — LUBIPROSTONE 24 MCG PO CAPS
24.0000 ug | ORAL_CAPSULE | Freq: Two times a day (BID) | ORAL | Status: DC
  Administered 2018-11-28 – 2018-11-30 (×5): 24 ug via ORAL
  Filled 2018-11-28 (×7): qty 1

## 2018-11-28 MED ORDER — INSULIN GLARGINE 100 UNIT/ML ~~LOC~~ SOLN
50.0000 [IU] | Freq: Every day | SUBCUTANEOUS | Status: DC
  Administered 2018-11-28: 50 [IU] via SUBCUTANEOUS
  Filled 2018-11-28 (×2): qty 0.5

## 2018-11-28 MED ORDER — INSULIN GLARGINE 100 UNIT/ML ~~LOC~~ SOLN
30.0000 [IU] | Freq: Every day | SUBCUTANEOUS | Status: DC
  Administered 2018-11-28: 30 [IU] via SUBCUTANEOUS
  Filled 2018-11-28 (×2): qty 0.3

## 2018-11-28 MED ORDER — SODIUM CHLORIDE 0.9% FLUSH
3.0000 mL | Freq: Two times a day (BID) | INTRAVENOUS | Status: DC
  Administered 2018-11-28 – 2018-11-30 (×6): 3 mL via INTRAVENOUS

## 2018-11-28 MED ORDER — PANTOPRAZOLE SODIUM 40 MG PO TBEC
40.0000 mg | DELAYED_RELEASE_TABLET | Freq: Two times a day (BID) | ORAL | Status: DC
  Administered 2018-11-28 – 2018-11-30 (×5): 40 mg via ORAL
  Filled 2018-11-28 (×5): qty 1

## 2018-11-28 MED ORDER — TOPIRAMATE 25 MG PO TABS
25.0000 mg | ORAL_TABLET | Freq: Two times a day (BID) | ORAL | Status: DC
  Administered 2018-11-28 – 2018-11-30 (×5): 25 mg via ORAL
  Filled 2018-11-28 (×5): qty 1

## 2018-11-28 MED ORDER — HYDROCODONE-ACETAMINOPHEN 5-325 MG PO TABS: 1.0000 | ORAL_TABLET | Freq: Three times a day (TID) | ORAL | Status: DC | PRN

## 2018-11-28 MED ORDER — INSULIN ASPART 100 UNIT/ML ~~LOC~~ SOLN
8.0000 [IU] | Freq: Three times a day (TID) | SUBCUTANEOUS | Status: DC
  Administered 2018-11-28 – 2018-11-29 (×3): 8 [IU] via SUBCUTANEOUS

## 2018-11-28 NOTE — Progress Notes (Addendum)
Inpatient Diabetes Program Recommendations  AACE/ADA: New Consensus Statement on Inpatient Glycemic Control (2015)  Target Ranges:  Prepandial:   less than 140 mg/dL      Peak postprandial:   less than 180 mg/dL (1-2 hours)      Critically ill patients:  140 - 180 mg/dL   Lab Results  Component Value Date   GLUCAP 440 (H) 11/28/2018   HGBA1C 10.2 (H) 11/07/2018    Review of Glycemic Control Results for Nancy [name], Nancy [name] (MRN [id number]) as of 11/28/2018 08:08  Ref. Range 11/27/2018 20:53 11/27/2018 23:04 11/28/2018 00:07 11/28/2018 02:01 11/28/2018 07:50  Glucose-Capillary Latest Ref Range: 70 - 99 mg/dL >600 (HH) 386 (H) 188 (H) 334 (H) 440 (H)   Diabetes history: DM 2 Outpatient Diabetes medications: Lantus 60 units, Humalog 15 units bid with meals Current orders for Inpatient glycemic control:  Lantus 30 units Novolog 0-20 units tid  A1c 10.2 on 11/1  Inpatient Diabetes Program Recommendations:    Glucose currently 440. Consider increasing Lantus to 50 units. Give additional 20 units this am.  NOTE: Noted consult for Diabetes Coordinator. Diabetes Coordinator is not on campus over the weekend but available by pager from 8am to 5pm for questions or concerns. Chart reviewed.   If pt here 11/23 DM coordinator will touch base with pt.   Thanks,  Tama Headings RN, MSN, BC-ADM Inpatient Diabetes Coordinator Team Pager [phone] (8a-5p)

## 2018-11-28 NOTE — ED Triage Notes (Signed)
Report given to [facility] That DR Wyline Copas notified of PT CBG

## 2018-11-28 NOTE — ED Triage Notes (Signed)
CBG 440 

## 2018-11-28 NOTE — ED Notes (Signed)
ED TO INPATIENT HANDOFF REPORT  ED Nurse Name and Phone #: Osborne Casco, RN  S Name/Age/Gender Nancy [name] 18 y.o. female Room/Bed: 228 [id number]  Code Status   Code Status: Full Code  Home/SNF/Other Home Patient oriented to: self, place, time and situation Is this baseline? Yes   Triage Complete: Triage complete  Chief Complaint seizure, hypertention  Triage Note The pt arrived by gems from home wherfe she thinks she had a seizure  Hx seizures  She also has multiple other complaints. shes a diabetic and the cbg of ems read high coffeeground emesis for 3 days  None observed chest pain also   Nausea no vomiting  CBG 440  Report given to [place] That DR Rhona Leavens notified of PT CBG   Allergies Allergies  Allergen Reactions  . Ibuprofen Other (See Comments)    GI MD said to not take this anymore  . Oatmeal Rash    Level of Care/Admitting Diagnosis ED Disposition    ED Disposition Condition Comment   Admit  Hospital Area: [facility] [[id number]]  Level of Care: Progressive [102]  Admit to Progressive based on following criteria: GI, ENDOCRINE disease patients with GI bleeding, acute liver failure or pancreatitis, stable with diabetic ketoacidosis or thyrotoxicosis (hypothyroid) state.  Covid Evaluation: Asymptomatic Screening Protocol (No Symptoms)  Diagnosis: Uncontrolled diabetes mellitus [facility]) [[id number]]  Admitting Physician: Briscoe Deutscher [[id number]]  Attending Physician: Jerald Kief [[id number]]  Estimated length of stay: past midnight tomorrow  Certification:: I certify this patient will need inpatient services for at least 2 midnights  PT Class (Do Not Modify): Inpatient [101]  PT Acc Code (Do Not Modify): Private [1]       B Medical/Surgery History Past Medical History:  Diagnosis Date  . Acanthosis nigricans   . Anxiety   . CHF (congestive heart failure) (HCC)   . Chronic lower back pain   . Depression   . DKA, type 1 (HCC) 09/13/2018  . Dyspepsia    . Obesity   . Ovarian cyst    pt is not aware of this hx (11/24/2017)  . Pre-diabetes   . 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 Premature baby   . Seizures (HCC)   . Type II diabetes mellitus (HCC)    insulin dependant   Past Surgical History:  Procedure Laterality Date  . ABDOMINAL HERNIA REPAIR     "I was a baby"  . BIOPSY  10/12/2018   Procedure: BIOPSY;  Surgeon: Lynann Bologna, MD;  Location: [facility] ENDOSCOPY;  Service: Endoscopy;;  . ESOPHAGOGASTRODUODENOSCOPY (EGD) WITH PROPOFOL N/A 10/12/2018   Procedure: ESOPHAGOGASTRODUODENOSCOPY (EGD) WITH PROPOFOL;  Surgeon: Lynann Bologna, MD;  Location: [facility] ENDOSCOPY;  Service: Endoscopy;  Laterality: N/A;  . HERNIA REPAIR    . LEFT HEART CATH AND CORONARY ANGIOGRAPHY N/A 10/13/2018   Procedure: LEFT HEART CATH AND CORONARY ANGIOGRAPHY;  Surgeon: Kathleene Hazel, MD;  Location: [facility] INVASIVE CV LAB;  Service: Cardiovascular;  Laterality: N/A;  . TONSILLECTOMY AND ADENOIDECTOMY    . WISDOM TOOTH EXTRACTION  2017     A IV Location/Drains/Wounds Patient Lines/Drains/Airways Status   Active Line/Drains/Airways    Name:   Placement date:   Placement time:   Site:   Days:   Peripheral IV 11/27/18 Left Antecubital   11/27/18    2250    Antecubital   1          Intake/Output Last 24 hours  Intake/Output Summary (Last 24 hours) at 11/28/2018 1720 Last data filed  at 11/28/2018 1610 Gross per 24 hour  Intake 2407.25 ml  Output -  Net 2407.25 ml    Labs/Imaging Results for orders placed or performed during the hospital encounter of 11/27/18 (from the past 48 hour(s))  CBG monitoring, ED     Status: Abnormal   Collection Time: 11/27/18  8:53 PM  Result Value Ref Range   Glucose-Capillary >600 (HH) 70 - 99 mg/dL  Basic metabolic panel     Status: Abnormal   Collection Time: 11/27/18  8:57 PM  Result Value Ref Range   Sodium 125 (L) 135 - 145 mmol/L   Potassium 3.6 3.5 - 5.1 mmol/L   Chloride 95 (L) 98 - 111 mmol/L   CO2 15 (L) 22 -  32 mmol/L   Glucose, Bld 861 (HH) 70 - 99 mg/dL    Comment: CRITICAL RESULT CALLED TO, READ BACK BY AND VERIFIED WITH: CAMPBELL T,RN 11/27/18 2139 WAYK    BUN 9 6 - 20 mg/dL   Creatinine, Ser 9.60 0.44 - 1.00 mg/dL   Calcium 8.7 (L) 8.9 - 10.3 mg/dL   GFR calc non Af Amer >60 >60 mL/min   GFR calc Af Amer >60 >60 mL/min   Anion gap 15 5 - 15    Comment: Performed at [facility], [address] [place], [place] [address]  CBC     Status: None   Collection Time: 11/27/18  8:57 PM  Result Value Ref Range   WBC 9.3 4.0 - 10.5 K/uL   RBC 4.39 3.87 - 5.11 MIL/uL   Hemoglobin 12.1 12.0 - 15.0 g/dL   HCT 81.1 91.4 - 78.2 %   MCV 87.5 80.0 - 100.0 fL   MCH 27.6 26.0 - 34.0 pg   MCHC 31.5 30.0 - 36.0 g/dL   RDW 95.6 21.3 - 08.6 %   Platelets 283 150 - 400 K/uL   nRBC 0.0 0.0 - 0.2 %    Comment: Performed at [facility], [address] [place], [place] [address]  Lipase, blood     Status: None   Collection Time: 11/27/18  8:57 PM  Result Value Ref Range   Lipase 26 11 - 51 U/L    Comment: Performed at [name], [address] [place], [place] [address]  I-Stat beta hCG blood, ED     Status: None   Collection Time: 11/27/18  9:01 PM  Result Value Ref Range   I-stat hCG, quantitative <5.0 <5 mIU/mL   Comment 3            Comment:   GEST. AGE      CONC.  (mIU/mL)   <=1 WEEK        5 - 50     2 WEEKS       50 - 500     3 WEEKS       100 - 10,000     4 WEEKS     1,000 - 30,000        FEMALE AND NON-PREGNANT FEMALE:     LESS THAN 5 mIU/mL   Urinalysis, Routine w reflex microscopic     Status: Abnormal   Collection Time: 11/27/18  9:05 PM  Result Value Ref Range   Color, Urine STRAW (A) YELLOW   APPearance HAZY (A) CLEAR   Specific Gravity, Urine 1.030 1.005 - 1.030   pH 6.0 5.0 - 8.0   Glucose, UA >=500 (A) NEGATIVE mg/dL   Hgb urine dipstick  NEGATIVE NEGATIVE   Bilirubin Urine NEGATIVE NEGATIVE   Ketones, ur NEGATIVE NEGATIVE mg/dL   Protein, ur  NEGATIVE NEGATIVE mg/dL   Nitrite NEGATIVE NEGATIVE   Leukocytes,Ua NEGATIVE NEGATIVE   RBC / HPF 6-10 0 - 5 RBC/hpf   WBC, UA 0-5 0 - 5 WBC/hpf   Bacteria, UA FEW (A) NONE SEEN   Squamous Epithelial / LPF 6-10 0 - 5   Mucus PRESENT    Budding Yeast PRESENT     Comment: Performed at [facility], [address], [place], [place] [address]  Hepatic function panel     Status: Abnormal   Collection Time: 11/27/18  9:35 PM  Result Value Ref Range   Total Protein 6.3 (L) 6.5 - 8.1 g/dL   Albumin 3.6 3.5 - 5.0 g/dL   AST 21 15 - 41 U/L   ALT 13 0 - 44 U/L   Alkaline Phosphatase 95 38 - 126 U/L   Total Bilirubin 0.3 0.3 - 1.2 mg/dL   Bilirubin, Direct 0.1 0.0 - 0.2 mg/dL   Indirect Bilirubin 0.2 (L) 0.3 - 0.9 mg/dL    Comment: Performed at [facility], [address] [place], [place] [address]  Troponin I (High Sensitivity)     Status: None   Collection Time: 11/27/18  9:35 PM  Result Value Ref Range   Troponin I (High Sensitivity) <2 <18 ng/L    Comment: Performed at [facility], [address] [place], [place] [address]  Basic metabolic panel     Status: Abnormal   Collection Time: 11/27/18 10:18 PM  Result Value Ref Range   Sodium 135 135 - 145 mmol/L   Potassium 3.6 3.5 - 5.1 mmol/L   Chloride 102 98 - 111 mmol/L   CO2 18 (L) 22 - 32 mmol/L   Glucose, Bld 393 (H) 70 - 99 mg/dL   BUN 8 6 - 20 mg/dL   Creatinine, Ser 2.13 0.44 - 1.00 mg/dL   Calcium 9.1 8.9 - 08.6 mg/dL   GFR calc non Af Amer >60 >60 mL/min   GFR calc Af Amer >60 >60 mL/min   Anion gap 15 5 - 15    Comment: Performed at [facility], [address] [place], [place] [address]  CBG monitoring, ED     Status: Abnormal   Collection Time: 11/27/18 11:04 PM  Result Value Ref Range   Glucose-Capillary 386 (H) 70 - 99 mg/dL  Beta-hydroxybutyric acid     Status: None   Collection Time: 11/27/18 11:28 PM  Result Value Ref Range   Beta-Hydroxybutyric Acid 0.09 0.05 - 0.27 mmol/L     Comment: Performed at [facility], [address], [place], [place] [address]  Basic metabolic panel     Status: Abnormal   Collection Time: 11/27/18 11:30 PM  Result Value Ref Range   Sodium 135 135 - 145 mmol/L   Potassium 3.8 3.5 - 5.1 mmol/L   Chloride 101 98 - 111 mmol/L   CO2 21 (L) 22 - 32 mmol/L   Glucose, Bld 390 (H) 70 - 99 mg/dL   BUN 8 6 - 20 mg/dL   Creatinine, Ser 2.84 0.44 - 1.00 mg/dL   Calcium 9.2 8.9 - 13.2 mg/dL   GFR calc non Af Amer >60 >60 mL/min   GFR calc Af Amer >60 >60 mL/min   Anion gap 13 5 - 15    Comment: Performed at [facility], [address] [place], [place] [address]  CBG monitoring, ED     Status: Abnormal   Collection Time: 11/28/18 12:07 AM  Result Value Ref Range   Glucose-Capillary 188 (H) 70 - 99 mg/dL  POCT I-Stat EG7     Status: Abnormal   Collection Time: 11/28/18 12:12 AM  Result Value Ref Range   pH, Ven 7.342 7.250 - 7.430   pCO2, Ven 41.9 (L) 44.0 - 60.0 mmHg   pO2, Ven 161.0 (H) 32.0 - 45.0 mmHg   Bicarbonate 22.7 20.0 - 28.0 mmol/L   TCO2 24 22 - 32 mmol/L   O2 Saturation 99.0 %   Acid-base deficit 3.0 (H) 0.0 - 2.0 mmol/L   Sodium 136 135 - 145 mmol/L   Potassium 3.8 3.5 - 5.1 mmol/L   Calcium, Ion 1.18 1.15 - 1.40 mmol/L   HCT 39.0 36.0 - 46.0 %   Hemoglobin 13.3 12.0 - 15.0 g/dL   Patient temperature HIDE    Sample type VENOUS   SARS CORONAVIRUS 2 (TAT 6-24 HRS) Nasopharyngeal Nasopharyngeal Swab     Status: None   Collection Time: 11/28/18  1:04 AM   Specimen: Nasopharyngeal Swab  Result Value Ref Range   SARS Coronavirus 2 NEGATIVE NEGATIVE    Comment: (NOTE) SARS-CoV-2 target nucleic acids are NOT DETECTED. The SARS-CoV-2 RNA is generally detectable in upper and lower respiratory specimens during the acute phase of infection. Negative results do not preclude SARS-CoV-2 infection, do not rule out co-infections with other pathogens, and should not be used as the sole basis for treatment or other  patient management decisions. Negative results must be combined with clinical observations, patient history, and epidemiological information. The expected result is Negative. Fact Sheet for Patients: SugarRoll.be Fact Sheet for Healthcare Providers: https://www.woods-mathews.com/ This test is not yet approved or cleared by the [place] FDA and  has been authorized for detection and/or diagnosis of SARS-CoV-2 by FDA under an Emergency Use Authorization (EUA). This EUA will remain  in effect (meaning this test can be used) for the duration of the COVID-19 declaration under Section 56 4(b)(1) of the Act, 21 U.S.C. section 360bbb-3(b)(1), unless the authorization is terminated or revoked sooner. Performed at [name], [place] [address] [place], [place] [address]   CBG monitoring, ED     Status: Abnormal   Collection Time: 11/28/18  2:01 AM  Result Value Ref Range   Glucose-Capillary 334 (H) 70 - 99 mg/dL  Basic metabolic panel     Status: Abnormal   Collection Time: 11/28/18  6:00 AM  Result Value Ref Range   Sodium 134 (L) 135 - 145 mmol/L   Potassium 4.1 3.5 - 5.1 mmol/L   Chloride 103 98 - 111 mmol/L   CO2 23 22 - 32 mmol/L   Glucose, Bld 332 (H) 70 - 99 mg/dL   BUN 7 6 - 20 mg/dL   Creatinine, Ser 0.56 0.44 - 1.00 mg/dL   Calcium 8.0 (L) 8.9 - 10.3 mg/dL   GFR calc non Af Amer >60 >60 mL/min   GFR calc Af Amer >60 >60 mL/min   Anion gap 8 5 - 15    Comment: Performed at [facility], [place] [address], [place], [place] [address]  HIV Antibody (routine testing w rflx)     Status: None   Collection Time: 11/28/18  6:00 AM  Result Value Ref Range   HIV Screen 4th Generation wRfx NON REACTIVE NON REACTIVE    Comment: Performed at  [facility], [address] [place], [place] [address]  CBC     Status: Abnormal   Collection Time: 11/28/18  6:00 AM  Result Value Ref Range   WBC 8.3 4.0 - 10.5 K/uL   RBC 3.98 3.87  - 5.11 MIL/uL   Hemoglobin 10.8 (L) 12.0 - 15.0 g/dL   HCT 47.433.6 (L) 25.936.0 - 56.346.0 %   MCV 84.4 80.0 - 100.0 fL   MCH 27.1 26.0 - 34.0 pg   MCHC 32.1 30.0 - 36.0 g/dL   RDW 87.513.2 64.311.5 - 32.915.5 %   Platelets 251 150 - 400 K/uL   nRBC 0.0 0.0 - 0.2 %    Comment: Performed at [facility], [address] [place], [place] [address]  CBG monitoring, ED     Status: Abnormal   Collection Time: 11/28/18  7:50 AM  Result Value Ref Range   Glucose-Capillary 440 (H) 70 - 99 mg/dL  Basic metabolic panel     Status: Abnormal   Collection Time: 11/28/18  8:23 AM  Result Value Ref Range   Sodium 134 (L) 135 - 145 mmol/L   Potassium 4.1 3.5 - 5.1 mmol/L   Chloride 104 98 - 111 mmol/L   CO2 23 22 - 32 mmol/L   Glucose, Bld 473 (H) 70 - 99 mg/dL   BUN 7 6 - 20 mg/dL   Creatinine, Ser 1.660.64 0.44 - 1.00 mg/dL   Calcium 8.1 (L) 8.9 - 10.3 mg/dL   GFR calc non Af Amer >60 >60 mL/min   GFR calc Af Amer >60 >60 mL/min   Anion gap 7 5 - 15    Comment: Performed at [facility], [address] [place], [place] [address]  CBG monitoring, ED     Status: Abnormal   Collection Time: 11/28/18 12:01 PM  Result Value Ref Range   Glucose-Capillary 156 (H) 70 - 99 mg/dL  CBG monitoring, ED     Status: Abnormal   Collection Time: 11/28/18  4:24 PM  Result Value Ref Range   Glucose-Capillary 220 (H) 70 - 99 mg/dL   No results found.  Pending Labs [facility] (From admission, onward)    Start     Ordered   11/27/18 2218  Basic metabolic panel  (Diabetes Ketoacidosis (DKA))  STAT Now then every 4 hours ,   STAT     11/27/18 2219          Vitals/Pain Today's Vitals   11/28/18 1130 11/28/18 1200 11/28/18 1229 11/28/18 1245  BP: 115/68 107/68  121/70  Pulse: (!) 110 (!) 109    Resp: 17 (!) 22  (!) 21  Temp:      SpO2: 98% 98%    Weight:      Height:      PainSc:   2      Isolation Precautions No active isolations  Medications Medications  sodium chloride flush (NS) 0.9 %  injection 3 mL (has no administration in time range)  nystatin (MYCOSTATIN) 100000 UNIT/ML suspension 500,000 Units (500,000 Units Oral Given 11/28/18 1237)  dextrose 50 % solution 0-50 mL (has no administration in time range)  ondansetron (ZOFRAN) injection 4 mg (has no administration in time range)  promethazine (PHENERGAN) injection 12.5 mg (has no administration in time range)  propranolol (INDERAL) tablet 20 mg (20 mg Oral Given 11/28/18 0941)  hydrOXYzine (ATARAX/VISTARIL) tablet 50 mg (has no administration in time range)  mirtazapine (REMERON) tablet 7.5 mg (7.5 mg  Oral Given 11/28/18 0149)  QUEtiapine (SEROQUEL) tablet 100 mg (100 mg Oral Given 11/28/18 0150)  QUEtiapine (SEROQUEL) tablet 25 mg (25 mg Oral Given 11/28/18 0940)  lamoTRIgine (LAMICTAL) tablet 50 mg (50 mg Oral Given 11/28/18 1237)  topiramate (TOPAMAX) tablet 25 mg (25 mg Oral Given 11/28/18 0939)  atorvastatin (LIPITOR) tablet 20 mg (has no administration in time range)  lubiprostone (AMITIZA) capsule 24 mcg (24 mcg Oral Given 11/28/18 0940)  pantoprazole (PROTONIX) EC tablet 40 mg (40 mg Oral Given 11/28/18 0939)  sodium chloride flush (NS) 0.9 % injection 3 mL (3 mLs Intravenous Given 11/28/18 1236)  acetaminophen (TYLENOL) tablet 650 mg (has no administration in time range)    Or  acetaminophen (TYLENOL) suppository 650 mg (has no administration in time range)  polyethylene glycol (MIRALAX / GLYCOLAX) packet 17 g (has no administration in time range)  insulin aspart (novoLOG) injection 0-20 Units (4 Units Subcutaneous Given 11/28/18 1240)  insulin glargine (LANTUS) injection 50 Units (has no administration in time range)  insulin aspart (novoLOG) injection 8 Units (8 Units Subcutaneous Given 11/28/18 1239)  lactulose (CHRONULAC) 10 GM/15ML solution 20 g (has no administration in time range)  bisacodyl (DULCOLAX) suppository 10 mg (has no administration in time range)  ondansetron (ZOFRAN) injection 4 mg (4 mg  Intravenous Given 11/27/18 2207)  sodium chloride 0.9 % bolus 1,000 mL (0 mLs Intravenous Stopped 11/28/18 0619)  insulin aspart (novoLOG) injection 15 Units (15 Units Intravenous Given 11/27/18 2203)  potassium chloride 10 mEq in 100 mL IVPB (0 mEq Intravenous Stopped 11/28/18 0619)  morphine 4 MG/ML injection 4 mg (4 mg Intravenous Given 11/27/18 2309)  promethazine (PHENERGAN) injection 12.5 mg (12.5 mg Intravenous Given 11/27/18 2306)  sodium chloride 0.9 % bolus 1,000 mL (0 mLs Intravenous Stopped 11/28/18 0619)  pantoprazole (PROTONIX) injection 40 mg (40 mg Intravenous Given 11/28/18 0030)    Mobility walks Low fall risk     R Recommendations: See Admitting Provider Note  Report given to:   Additional Notes:

## 2018-11-28 NOTE — Progress Notes (Signed)
PROGRESS NOTE    Nancy [name]  DGU:[id number] DOB: 15-Feb-2000 DOA: 11/27/2018 PCP: [name], NP    Brief Narrative:  18 y.o. female with medical history significant for type 1 diabetes mellitus, seizures and/or pseudoseizures, depression with anxiety, hypertension, and chronic pain, now presenting to the emergency department with abdominal pain, general malaise, nausea, vomiting, and possible seizure.  Patient reports that she developed increase in her chronic abdominal pain approximately 3 days ago and has had increase in her chronic nausea and vomiting over the same interval.  She reports seeing some dark gritty material in the vomitus.  She has had some loose stools but denies diarrhea per se.  She denies any fevers, chills, cough, or shortness of breath.  Her chronic chest pain has not been bothering her lately.  She felt as though she was having a seizure earlier today, denies any injury associated with this, denies incontinence, and states that there is no witnesses.  She called EMS and was found to have CBG "high," was treated with 250 cc of IV fluid, and brought into the ED.  ED Course: Upon arrival to the ED, patient is found to be afebrile, saturating well on room air, mildly tachypneic, tachycardic to the 130s, and with stable blood pressure.  EKG features sinus tachycardia with rate 129 and nonspecific ST abnormality that is unchanged from prior.  Chemistry panel is concerning for a glucose of 861 with bicarbonate of 15 and anion gap 15.  CBC is unremarkable.  High-sensitivity troponin is undetectable.  Lipase was normal.  Urinalysis notable for glucose urea but no ketones.  Patient was given a liter of normal saline, morphine, Zofran, IV potassium, IV NovoLog bolus, and was started on insulin infusion in the ED.  COVID-19 neg  Assessment & Plan:   Principal Problem:   Uncontrolled diabetes mellitus with hyperglycemia (HCC) Active Problems:   DM (diabetes mellitus), type 1,  uncontrolled ([place])   Conversion disorder with attacks or seizures, acute episode, with psychological stressor   Severe recurrent major depression without psychotic features ([place])   Uncontrolled diabetes mellitus ([place])   1. Uncontrolled type I DM with hyperglycemia, possible DKA  - Presented with abdominal pain, N/V, and is found to have serum glucose of 861 with bicarb 15, normal AG, no urine ketones, and beta-hydroxybutyrate pending   - She claims adherence with insulin regimen, denies illicit substance use, and does not appear to have any severe acute illness as precipitant  - A1c was 10.2% earlier this month  - She was fluid-resuscitated with NS in ED and started on insulin infusion  - Pt now transitioned to subq insulin. Improved. Continue to titrate as needed -Will need to continue to monitor overnight given markedly labile glucose and high risk for developing DKA  2. Abdominal pain, N/V  - Patient has chronic abdominal pain, frequent N/V, and reports N/V for the past 3 days with some coffee-ground material  - Abdominal exam benign  - Chronic abd pain was addressed one month ago. During that admit, pt was seen by GI and pt underwent EGD with findings notable only for some candida -Imaging from that admit was notable for constipation with pain improved after BM - Prior documentation also reported chronic abdominal pain due to suspected somatoform disorder on a 09/15/18 discharge summary - Wean off narcotics  3. Depression, anxiety  - Continue Seroquel, Remeron, Lamictal, Inderal, prn Vistaril     4. Seizures  - Patient has hx of seizures and/or pseudoseizures  -  Continue Topomax    5. Hypertension  - BP low-normal in ED and Norvasc held on admission, Inderal continued with holding parameters   - Stable at present    DVT prophylaxis: SCD's Code Status: Full Family Communication: Pt in room, family not at bedside Disposition Plan: Uncertain at this time  Consultants:      Procedures:     Antimicrobials: Anti-infectives (From admission, onward)   None       Subjective: Claims to have abd pain  Objective: Vitals:   11/28/18 1000 11/28/18 1130 11/28/18 1200 11/28/18 1245  BP: 124/66 115/68 107/68 121/70  Pulse:  (!) 110 (!) 109   Resp: (!) 23 17 (!) 22 (!) 21  Temp:      SpO2:  98% 98%   Weight:      Height:        Intake/Output Summary (Last 24 hours) at 11/28/2018 1522 Last data filed at 11/28/2018 8502 Gross per 24 hour  Intake 2407.25 ml  Output -  Net 2407.25 ml   Filed Weights   11/27/18 2030  Weight: 89.8 kg    Examination:  General exam: Appears calm and comfortable  Respiratory system: Clear to auscultation. Respiratory effort normal. Cardiovascular system: S1 & S2 heard, Regular Gastrointestinal system: Abdomen is nondistended, soft Central nervous system: Alert and oriented. No focal neurological deficits. Extremities: Symmetric 5 x 5 power. Skin: No rashes, lesions Psychiatry: Judgement and insight appear normal. Mood & affect appropriate.   Data Reviewed: I have personally reviewed following labs and imaging studies  CBC: Recent Labs  Lab 11/27/18 2057 11/28/18 0012 11/28/18 0600  WBC 9.3  --  8.3  HGB 12.1 13.3 10.8*  HCT 38.4 39.0 33.6*  MCV 87.5  --  84.4  PLT 283  --  251   Basic Metabolic Panel: Recent Labs  Lab 11/27/18 2057 11/27/18 2218 11/27/18 2330 11/28/18 0012 11/28/18 0600 11/28/18 0823  NA 125* 135 135 136 134* 134*  K 3.6 3.6 3.8 3.8 4.1 4.1  CL 95* 102 101  --  103 104  CO2 15* 18* 21*  --  23 23  GLUCOSE 861* 393* 390*  --  332* 473*  BUN 9 8 8   --  7 7  CREATININE 1.00 0.87 0.78  --  0.56 0.64  CALCIUM 8.7* 9.1 9.2  --  8.0* 8.1*   GFR: Estimated Creatinine Clearance: 119.4 mL/min (by C-G formula based on SCr of 0.64 mg/dL). Liver Function Tests: Recent Labs  Lab 11/27/18 2135  AST 21  ALT 13  ALKPHOS 95  BILITOT 0.3  PROT 6.3*  ALBUMIN 3.6   Recent Labs  Lab  11/27/18 2057  LIPASE 26   No results for input(s): AMMONIA in the last 168 hours. Coagulation Profile: No results for input(s): INR, PROTIME in the last 168 hours. Cardiac Enzymes: No results for input(s): CKTOTAL, CKMB, CKMBINDEX, TROPONINI in the last 168 hours. BNP (last 3 results) No results for input(s): PROBNP in the last 8760 hours. HbA1C: No results for input(s): HGBA1C in the last 72 hours. CBG: Recent Labs  Lab 11/27/18 2304 11/28/18 0007 11/28/18 0201 11/28/18 0750 11/28/18 1201  GLUCAP 386* 188* 334* 440* 156*   Lipid Profile: No results for input(s): CHOL, HDL, LDLCALC, TRIG, CHOLHDL, LDLDIRECT in the last 72 hours. Thyroid Function Tests: No results for input(s): TSH, T4TOTAL, FREET4, T3FREE, THYROIDAB in the last 72 hours. Anemia Panel: No results for input(s): VITAMINB12, FOLATE, FERRITIN, TIBC, IRON, RETICCTPCT in the last 72  hours. Sepsis Labs: No results for input(s): PROCALCITON, LATICACIDVEN in the last 168 hours.  Recent Results (from the past 240 hour(s))  SARS CORONAVIRUS 2 (TAT 6-24 HRS) Nasopharyngeal Nasopharyngeal Swab     Status: None   Collection Time: 11/28/18  1:04 AM   Specimen: Nasopharyngeal Swab  Result Value Ref Range Status   SARS Coronavirus 2 NEGATIVE NEGATIVE Final    Comment: (NOTE) SARS-CoV-2 target nucleic acids are NOT DETECTED. The SARS-CoV-2 RNA is generally detectable in upper and lower respiratory specimens during the acute phase of infection. Negative results do not preclude SARS-CoV-2 infection, do not rule out co-infections with other pathogens, and should not be used as the sole basis for treatment or other patient management decisions. Negative results must be combined with clinical observations, patient history, and epidemiological information. The expected result is Negative. Fact Sheet for Patients: HairSlick.no Fact Sheet for Healthcare Providers:  quierodirigir.com This test is not yet approved or cleared by the [place] FDA and  has been authorized for detection and/or diagnosis of SARS-CoV-2 by FDA under an Emergency Use Authorization (EUA). This EUA will remain  in effect (meaning this test can be used) for the duration of the COVID-19 declaration under Section 56 4(b)(1) of the Act, 21 U.S.C. section 360bbb-3(b)(1), unless the authorization is terminated or revoked sooner. Performed at [facility], [address] [place], [place] [address]      Radiology Studies: No results found.  Scheduled Meds: . atorvastatin  20 mg Oral q1800  . bisacodyl  10 mg Rectal Once  . insulin aspart  0-20 Units Subcutaneous TID WC  . insulin aspart  8 Units Subcutaneous TID WC  . insulin glargine  50 Units Subcutaneous Daily  . lactulose  20 g Oral NOW  . lamoTRIgine  50 mg Oral BID  . lubiprostone  24 mcg Oral BID WC  . mirtazapine  7.5 mg Oral QHS  . nystatin  5 mL Oral TID AC & HS  . pantoprazole  40 mg Oral BID  . propranolol  20 mg Oral TID  . QUEtiapine  100 mg Oral QHS  . QUEtiapine  25 mg Oral BID WC  . sodium chloride flush  3 mL Intravenous Once  . sodium chloride flush  3 mL Intravenous Q12H  . topiramate  25 mg Oral BID   Continuous Infusions:   LOS: 0 days   Rickey Barbara, MD [facility] Pager On Amion  If 7PM-7AM, please contact night-coverage 11/28/2018, 3:22 PM

## 2018-11-29 LAB — COMPREHENSIVE METABOLIC PANEL
ALT: 13 U/L (ref 0–44)
AST: 12 U/L — ABNORMAL LOW (ref 15–41)
Albumin: 3.1 g/dL — ABNORMAL LOW (ref 3.5–5.0)
Alkaline Phosphatase: 70 U/L (ref 38–126)
Anion gap: 7 (ref 5–15)
BUN: 13 mg/dL (ref 6–20)
CO2: 24 mmol/L (ref 22–32)
Calcium: 8.5 mg/dL — ABNORMAL LOW (ref 8.9–10.3)
Chloride: 102 mmol/L (ref 98–111)
Creatinine, Ser: 0.79 mg/dL (ref 0.44–1.00)
GFR calc Af Amer: >60 mL/min (ref >60)
GFR calc non Af Amer: >60 mL/min (ref >60)
Glucose, Bld: 360 mg/dL — ABNORMAL HIGH (ref 70–99)
Potassium: 3.9 mmol/L (ref 3.5–5.1)
Sodium: 133 mmol/L — ABNORMAL LOW (ref 135–145)
Total Bilirubin: 0.1 mg/dL — ABNORMAL LOW (ref 0.3–1.2)
Total Protein: 5.6 g/dL — ABNORMAL LOW (ref 6.5–8.1)

## 2018-11-29 LAB — GLUCOSE, CAPILLARY
Glucose-Capillary: 396 mg/dL — ABNORMAL HIGH (ref 70–99)
Glucose-Capillary: 239 mg/dL — ABNORMAL HIGH (ref 70–99)
Glucose-Capillary: 312 mg/dL — ABNORMAL HIGH (ref 70–99)
Glucose-Capillary: 223 mg/dL — ABNORMAL HIGH (ref 70–99)

## 2018-11-29 MED ORDER — INSULIN ASPART 100 UNIT/ML ~~LOC~~ SOLN
12.0000 [IU] | Freq: Three times a day (TID) | SUBCUTANEOUS | Status: DC
  Administered 2018-11-29: 12 [IU] via SUBCUTANEOUS

## 2018-11-29 MED ORDER — INSULIN GLARGINE 100 UNIT/ML ~~LOC~~ SOLN
60.0000 [IU] | Freq: Every day | SUBCUTANEOUS | Status: DC
  Administered 2018-11-29: 60 [IU] via SUBCUTANEOUS
  Filled 2018-11-29 (×2): qty 0.6

## 2018-11-29 MED ORDER — GLUCERNA SHAKE PO LIQD
237.0000 mL | Freq: Three times a day (TID) | ORAL | Status: DC
  Administered 2018-11-29 – 2018-11-30 (×4): 237 mL via ORAL

## 2018-11-29 MED ORDER — METOCLOPRAMIDE HCL 5 MG/ML IJ SOLN
5.0000 mg | Freq: Three times a day (TID) | INTRAMUSCULAR | Status: DC
  Administered 2018-11-29 – 2018-11-30 (×4): 5 mg via INTRAVENOUS
  Filled 2018-11-29 (×4): qty 2

## 2018-11-29 MED ORDER — INSULIN ASPART 100 UNIT/ML ~~LOC~~ SOLN
15.0000 [IU] | Freq: Three times a day (TID) | SUBCUTANEOUS | Status: DC
  Administered 2018-11-29 – 2018-11-30 (×3): 15 [IU] via SUBCUTANEOUS

## 2018-11-29 MED ORDER — PEG 3350-KCL-NA BICARB-NACL 420 G PO SOLR
4000.0000 mL | Freq: Once | ORAL | Status: AC
  Administered 2018-11-29: 4000 mL via ORAL
  Filled 2018-11-29 (×2): qty 4000

## 2018-11-29 MED ORDER — ADULT MULTIVITAMIN W/MINERALS CH
1.0000 | ORAL_TABLET | Freq: Every day | ORAL | Status: DC
  Administered 2018-11-29 – 2018-11-30 (×2): 1 via ORAL
  Filled 2018-11-29 (×2): qty 1

## 2018-11-29 NOTE — Progress Notes (Signed)
Initial Nutrition Assessment  DOCUMENTATION CODES:   Obesity unspecified  INTERVENTION:    Glucerna Shake po TID, each supplement provides 220 kcal and 10 grams of protein  MVI daily   NUTRITION DIAGNOSIS:   Inadequate oral intake related to nausea, vomiting as evidenced by per patient/family report.  GOAL:   Patient will meet greater than or equal to 90% of their needs  MONITOR:   PO intake, Supplement acceptance, Weight trends, Labs, I & O's  REASON FOR ASSESSMENT:   Malnutrition Screening Tool    ASSESSMENT:   Patient with PMH significant for type 1 DM, CHF, seizures, depression, HTN, and chronic pain. Presents this admission with DKA.   RD working remotely.  Pt reports difficulty eating over the last year due to ongoing abdominal issues. States she is followed by GI and has not had any formal diagnosis. Denies gastroparesis. She typically eats 1 meal daily that consist of meat, vegetable, and grain (mother typically prepares). Over the last 3-4 days she was unable to consume anything PO due to worsening nausea/vomiting. Endorses compliance to insulin regimen. Discussed the importance of protein intake for preservation of lean body mass. Pt does not like Ensure but willing to try Glucerna.   Reports a UBW of 260 lb and a 65 lb wt loss in 2-3 months. Records indicate pt has maintained her weight around 184-194 lb over the last year. Will need to obtain actual weight to assess for weight loss this admission.   I/O: +2,330 ml since admit  UOP: 800 ml x 24 hrs    Medications: dulcolax, SS novolog, lantus, remeron Labs: CBG 156-473 A1C 10.2 (H)   Diet Order:   Diet Order            Diet Carb Modified Fluid consistency: Thin; Room service appropriate? Yes  Diet effective now              EDUCATION NEEDS:   Education needs have been addressed  Skin:  Skin Assessment: Reviewed RN Assessment  Last BM:  11/21  Height:   Ht Readings from Last 1 Encounters:   11/28/18 5\' 3"  (1.6 m)    Weight:   Wt Readings from Last 1 Encounters:  11/27/18 89.8 kg    Ideal Body Weight:  52.3 kg  BMI:  Body mass index is 35.07 kg/m.  Estimated Nutritional Needs:   Kcal:  1700-1900 kcal  Protein:  85-100 grams  Fluid:  >/= 1.7 L/day   Mariana Single RD, LDN Clinical Nutrition Pager # - [phone]

## 2018-11-29 NOTE — Progress Notes (Signed)
Patient drank the entire bowel prep container but still has not had a bowel movement. States she just feels gassy. Will continue to monitor.

## 2018-11-29 NOTE — Progress Notes (Signed)
PROGRESS NOTE    Nancy [name]  TRR:[id number] DOB: 2000/10/29 DOA: 11/27/2018 PCP: Claiborne Rigg, NP    Brief Narrative:  18 y.o. female with medical history significant for type 1 diabetes mellitus, seizures and/or pseudoseizures, depression with anxiety, hypertension, and chronic pain, now presenting to the emergency department with abdominal pain, general malaise, nausea, vomiting, and possible seizure.  Patient reports that she developed increase in her chronic abdominal pain approximately 3 days ago and has had increase in her chronic nausea and vomiting over the same interval.  She reports seeing some dark gritty material in the vomitus.  She has had some loose stools but denies diarrhea per se.  She denies any fevers, chills, cough, or shortness of breath.  Her chronic chest pain has not been bothering her lately.  She felt as though she was having a seizure earlier today, denies any injury associated with this, denies incontinence, and states that there is no witnesses.  She called EMS and was found to have CBG "high," was treated with 250 cc of IV fluid, and brought into the ED.  ED Course: Upon arrival to the ED, patient is found to be afebrile, saturating well on room air, mildly tachypneic, tachycardic to the 130s, and with stable blood pressure.  EKG features sinus tachycardia with rate 129 and nonspecific ST abnormality that is unchanged from prior.  Chemistry panel is concerning for a glucose of 861 with bicarbonate of 15 and anion gap 15.  CBC is unremarkable.  High-sensitivity troponin is undetectable.  Lipase was normal.  Urinalysis notable for glucose urea but no ketones.  Patient was given a liter of normal saline, morphine, Zofran, IV potassium, IV NovoLog bolus, and was started on insulin infusion in the ED.  COVID-19 neg  Assessment & Plan:   Principal Problem:   Uncontrolled diabetes mellitus with hyperglycemia (HCC) Active Problems:   DM (diabetes mellitus), type 1,  uncontrolled (HCC)   Conversion disorder with attacks or seizures, acute episode, with psychological stressor   Severe recurrent major depression without psychotic features (HCC)   Uncontrolled diabetes mellitus (HCC)   1. Uncontrolled type I DM with hyperglycemia, possible DKA  - Presented with abdominal pain, N/V, and is found to have serum glucose of 861 with bicarb 15, normal AG, no urine ketones, and beta-hydroxybutyrate pending   - She claims adherence with insulin regimen, denies illicit substance use, and does not appear to have any severe acute illness as precipitant  - A1c was 10.2% earlier this month  - She was fluid-resuscitated with NS in ED and started on insulin infusion  - Pt now transitioned to subq insulin. Cont to titrate insulin as needed  2. Abdominal pain, N/V  - Patient has chronic abdominal pain, frequent N/V, and reports N/V for the past 3 days with some coffee-ground material  - Abdominal exam benign  - Chronic abd pain was addressed one month ago. 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 No result with lactulose. Will give trial of reglan with golytely. If no result, then consider enema -Will need to treat constipation as pt is symptomatic and unable to tolerate much PO, thus likely resulting in  decompensation and early re-admission  3. Depression, anxiety  - Continue Seroquel, Remeron, Lamictal, Inderal, prn Vistaril -Seems stable at this time     4. Seizures  - Patient has hx of seizures and/or pseudoseizures  - Continue Topomax as tolerated  5. Hypertension  - BP low-normal in ED and Norvasc held on admission,  Inderal continued with holding parameters   - Remains stable   DVT prophylaxis: SCD's Code Status: Full Family Communication: Pt in room, family not at bedside Disposition Plan: Uncertain at this time  Consultants:     Procedures:     Antimicrobials: Anti-infectives (From admission, onward)   None      Subjective: Still with abd discomfort and no bowel movement  Objective: Vitals:   11/29/18 0501 11/29/18 0757 11/29/18 1205 11/29/18 1609  BP: 121/82 (!) 103/54 (!) 97/59 110/70  Pulse: (!) 109 85 97 96  Resp: (!) 21 (!) 21 (!) 23   Temp:  98.2 F (36.8 C) 97.9 F (36.6 C)   TempSrc:  Oral Oral   SpO2: 100% 100% 99%   Weight:      Height:        Intake/Output Summary (Last 24 hours) at 11/29/2018 1731 Last data filed at 11/29/2018 1608 Gross per 24 hour  Intake 1306 ml  Output 1300 ml  Net 6 ml   Filed Weights   11/27/18 2030  Weight: 89.8 kg    Examination: General exam: Awake, laying in bed, in nad Respiratory system: Normal respiratory effort, no wheezing Cardiovascular system: regular rate, s1, s2 Gastrointestinal system: Soft, nondistended, positive BS Central nervous system: CN2-12 grossly intact, strength intact Extremities: Perfused, no clubbing Skin: Normal skin turgor, no notable skin lesions seen Psychiatry: Mood normal // no visual hallucinations   Data Reviewed: I have personally reviewed following labs and imaging studies  CBC: Recent Labs  Lab 11/27/18 2057 11/28/18 0012 11/28/18 0600  WBC 9.3  --  8.3  HGB 12.1 13.3 10.8*  HCT 38.4 39.0 33.6*  MCV 87.5  --  84.4  PLT 283  --  251   Basic Metabolic Panel: Recent Labs  Lab 11/27/18 2218 11/27/18 2330 11/28/18 0012 11/28/18 0600 11/28/18 0823 11/29/18 0234  NA 135 135 136 134* 134* 133*  K 3.6 3.8 3.8 4.1 4.1 3.9  CL 102 101  --  103 104 102  CO2 18* 21*  --  23 23 24   GLUCOSE 393* 390*  --  332* 473* 360*  BUN 8 8  --  7 7 13   CREATININE 0.87 0.78  --  0.56 0.64  0.79  CALCIUM 9.1 9.2  --  8.0* 8.1* 8.5*   GFR: Estimated Creatinine Clearance: 119.4 mL/min (by C-G formula based on SCr of 0.79 mg/dL). Liver Function Tests: Recent Labs  Lab 11/27/18 2135 11/29/18 0234  AST 21 12*  ALT 13 13  ALKPHOS 95 70  BILITOT 0.3 0.1*  PROT 6.3* 5.6*  ALBUMIN 3.6 3.1*   Recent Labs  Lab 11/27/18 2057  LIPASE 26   No results for input(s): AMMONIA in the last 168 hours. Coagulation Profile: No results for input(s): INR, PROTIME in the last 168 hours. Cardiac Enzymes: No results for input(s): CKTOTAL, CKMB, CKMBINDEX, TROPONINI in the last 168 hours. BNP (last 3 results) No results for input(s): PROBNP in the last 8760 hours. HbA1C: No results for input(s): HGBA1C in the last 72 hours. CBG: Recent Labs  Lab 11/28/18 1624 11/28/18 2111 11/29/18 0755 11/29/18 1254 11/29/18 1658  GLUCAP  220* 327* 396* 239* 312*   Lipid Profile: No results for input(s): CHOL, HDL, LDLCALC, TRIG, CHOLHDL, LDLDIRECT in the last 72 hours. Thyroid Function Tests: No results for input(s): TSH, T4TOTAL, FREET4, T3FREE, THYROIDAB in the last 72 hours. Anemia Panel: No results for input(s): VITAMINB12, FOLATE, FERRITIN, TIBC, IRON, RETICCTPCT in the last 72 hours. Sepsis Labs: No results for input(s): PROCALCITON, LATICACIDVEN in the last 168 hours.  Recent Results (from the past 240 hour(s))  SARS CORONAVIRUS 2 (TAT 6-24 HRS) Nasopharyngeal Nasopharyngeal Swab     Status: None   Collection Time: 11/28/18  1:04 AM   Specimen: Nasopharyngeal Swab  Result Value Ref Range Status   SARS Coronavirus 2 NEGATIVE NEGATIVE Final    Comment: (NOTE) SARS-CoV-2 target nucleic acids are NOT DETECTED. The SARS-CoV-2 RNA is generally detectable in upper and lower respiratory specimens during the acute phase of infection. Negative results do not preclude SARS-CoV-2 infection, do not rule out co-infections with other pathogens, and should not be used as the sole basis for  treatment or other patient management decisions. Negative results must be combined with clinical observations, patient history, and epidemiological information. The expected result is Negative. Fact Sheet for Patients: SugarRoll.be Fact Sheet for Healthcare Providers: https://www.woods-mathews.com/ This test is not yet approved or cleared by the [place] FDA and  has been authorized for detection and/or diagnosis of SARS-CoV-2 by FDA under an Emergency Use Authorization (EUA). This EUA will remain  in effect (meaning this test can be used) for the duration of the COVID-19 declaration under Section 56 4(b)(1) of the Act, 21 U.S.C. section 360bbb-3(b)(1), unless the authorization is terminated or revoked sooner. Performed at [facility], [place] [employer] [address] [place], [place] [address]      Radiology Studies: No results found.  Scheduled Meds:  atorvastatin  20 mg Oral q1800   bisacodyl  10 mg Rectal Once   feeding supplement (GLUCERNA SHAKE)  237 mL Oral TID BM   insulin aspart  0-20 Units Subcutaneous TID WC   insulin aspart  12 Units Subcutaneous TID WC   insulin glargine  60 Units Subcutaneous Daily   lamoTRIgine  50 mg Oral BID   lubiprostone  24 mcg Oral BID WC   metoCLOPramide (REGLAN) injection  5 mg Intravenous Q8H   mirtazapine  7.5 mg Oral QHS   multivitamin with minerals  1 tablet Oral Daily   nystatin  5 mL Oral TID AC & HS   pantoprazole  40 mg Oral BID   propranolol  20 mg Oral TID   QUEtiapine  100 mg Oral QHS   QUEtiapine  25 mg Oral BID WC   sodium chloride flush  3 mL Intravenous Once   sodium chloride flush  3 mL Intravenous Q12H   topiramate  25 mg Oral BID   Continuous Infusions:   LOS: 1 day   Marylu Lund, MD [facility] Pager On Amion  If 7PM-7AM, please contact night-coverage 11/29/2018, 5:31 PM

## 2018-11-29 NOTE — Progress Notes (Signed)
MD stated that it is okay to remove high fall risk status since patient is steady on her feet and will be receiving bowel prep. Patient instructed to call if assistance is needed or if she becomes dizzy or lightheaded.

## 2018-11-29 NOTE — Progress Notes (Signed)
Inpatient Diabetes Program Recommendations  AACE/ADA: New Consensus Statement on Inpatient Glycemic Control (2015)  Target Ranges:  Prepandial:   less than 140 mg/dL      Peak postprandial:   less than 180 mg/dL (1-2 hours)      Critically ill patients:  140 - 180 mg/dL   Lab Results  Component Value Date   GLUCAP 239 (H) 11/29/2018   HGBA1C 10.2 (H) 11/07/2018    Review of Glycemic Control Results for TODD, ARGABRIGHT (MRN [id number]) as of 11/29/2018 14:47  Ref. Range 11/28/2018 16:24 11/28/2018 21:11 11/29/2018 07:55 11/29/2018 12:54  Glucose-Capillary Latest Ref Range: 70 - 99 mg/dL 220 (H) 327 (H) 396 (H) 239 (H)   Diabetes history: DM 2 Outpatient Diabetes medications:  Lantus 60 units q HS, Humalog 15 units tid with meals  Current orders for Inpatient glycemic control:  Lantus 60 units daily, Novolog 12 units tid with meals Novolog resistant tid with meals Inpatient Diabetes Program Recommendations:    Spoke with patient regarding home DM management.  She reports that she is taking her insulin as ordered although admits that she sometimes forgets meal doses.  She has appointment with Endocrinologist on Wednesday and is hoping that she will be a candidate for the insulin pump and possibly a sensor for monitoring.  She appears very resistant to insulin.  I inquired about whether she has been on metformin in the past and she states yes but it was stopped when insulin was initiated.  She also complained of stomach upset with Metformin.  Explained that she likely needs DM medication to help with insulin resistance as well.  Encourage checking CBG's as ordered and we also discussed A1C goals.  Discussed importance of control of DM to prevent complications from DM.  Patient states she has no questions at this time.   Thanks  Adah Perl, RN, BC-ADM Inpatient Diabetes Coordinator Pager [phone] (8a-5p)

## 2018-11-30 ENCOUNTER — Other Ambulatory Visit: Payer: Self-pay

## 2018-11-30 ENCOUNTER — Emergency Department (HOSPITAL_COMMUNITY): Payer: Medicaid Other

## 2018-11-30 ENCOUNTER — Encounter (HOSPITAL_COMMUNITY): Payer: Self-pay | Admitting: Radiology

## 2018-11-30 ENCOUNTER — Inpatient Hospital Stay (HOSPITAL_COMMUNITY)
Admission: EM | Admit: 2018-11-30 | Discharge: 2018-12-02 | DRG: 880 | Disposition: A | Discharge status: Home / Self Care | Payer: Medicaid Other | Attending: Internal Medicine | Admitting: Internal Medicine

## 2018-11-30 DIAGNOSIS — F419 Anxiety disorder, unspecified: Secondary | ICD-10-CM | POA: Diagnosis present

## 2018-11-30 DIAGNOSIS — F445 Conversion disorder with seizures or convulsions: Principal | ICD-10-CM | POA: Diagnosis present

## 2018-11-30 DIAGNOSIS — E1165 Type 2 diabetes mellitus with hyperglycemia: Secondary | ICD-10-CM

## 2018-11-30 DIAGNOSIS — Z6379 Other stressful life events affecting family and household: Secondary | ICD-10-CM

## 2018-11-30 DIAGNOSIS — R739 Hyperglycemia, unspecified: Secondary | ICD-10-CM

## 2018-11-30 DIAGNOSIS — N179 Acute kidney failure, unspecified: Secondary | ICD-10-CM

## 2018-11-30 DIAGNOSIS — Z79899 Other long term (current) drug therapy: Secondary | ICD-10-CM

## 2018-11-30 DIAGNOSIS — G8929 Other chronic pain: Secondary | ICD-10-CM | POA: Diagnosis present

## 2018-11-30 DIAGNOSIS — Z8249 Family history of ischemic heart disease and other diseases of the circulatory system: Secondary | ICD-10-CM

## 2018-11-30 DIAGNOSIS — Z794 Long term (current) use of insulin: Secondary | ICD-10-CM

## 2018-11-30 DIAGNOSIS — K219 Gastro-esophageal reflux disease without esophagitis: Secondary | ICD-10-CM | POA: Diagnosis present

## 2018-11-30 DIAGNOSIS — K5900 Constipation, unspecified: Secondary | ICD-10-CM | POA: Diagnosis present

## 2018-11-30 DIAGNOSIS — Z20828 Contact with and (suspected) exposure to other viral communicable diseases: Secondary | ICD-10-CM | POA: Diagnosis present

## 2018-11-30 DIAGNOSIS — Z8719 Personal history of other diseases of the digestive system: Secondary | ICD-10-CM

## 2018-11-30 DIAGNOSIS — F4320 Adjustment disorder, unspecified: Secondary | ICD-10-CM | POA: Diagnosis present

## 2018-11-30 DIAGNOSIS — I493 Ventricular premature depolarization: Secondary | ICD-10-CM | POA: Diagnosis present

## 2018-11-30 DIAGNOSIS — R112 Nausea with vomiting, unspecified: Secondary | ICD-10-CM | POA: Diagnosis present

## 2018-11-30 DIAGNOSIS — Z833 Family history of diabetes mellitus: Secondary | ICD-10-CM

## 2018-11-30 DIAGNOSIS — E1159 Type 2 diabetes mellitus with other circulatory complications: Secondary | ICD-10-CM | POA: Diagnosis present

## 2018-11-30 DIAGNOSIS — R55 Syncope and collapse: Secondary | ICD-10-CM | POA: Diagnosis present

## 2018-11-30 DIAGNOSIS — Z9089 Acquired absence of other organs: Secondary | ICD-10-CM

## 2018-11-30 DIAGNOSIS — Z886 Allergy status to analgesic agent status: Secondary | ICD-10-CM

## 2018-11-30 DIAGNOSIS — R569 Unspecified convulsions: Secondary | ICD-10-CM

## 2018-11-30 DIAGNOSIS — E669 Obesity, unspecified: Secondary | ICD-10-CM | POA: Diagnosis present

## 2018-11-30 DIAGNOSIS — F4310 Post-traumatic stress disorder, unspecified: Secondary | ICD-10-CM | POA: Diagnosis present

## 2018-11-30 DIAGNOSIS — Z915 Personal history of self-harm: Secondary | ICD-10-CM

## 2018-11-30 DIAGNOSIS — E872 Acidosis: Secondary | ICD-10-CM | POA: Diagnosis present

## 2018-11-30 DIAGNOSIS — Z6835 Body mass index (BMI) 35.0-35.9, adult: Secondary | ICD-10-CM

## 2018-11-30 DIAGNOSIS — F339 Major depressive disorder, recurrent, unspecified: Secondary | ICD-10-CM | POA: Diagnosis present

## 2018-11-30 DIAGNOSIS — I10 Essential (primary) hypertension: Secondary | ICD-10-CM | POA: Diagnosis present

## 2018-11-30 DIAGNOSIS — Z8349 Family history of other endocrine, nutritional and metabolic diseases: Secondary | ICD-10-CM

## 2018-11-30 DIAGNOSIS — I152 Hypertension secondary to endocrine disorders: Secondary | ICD-10-CM | POA: Diagnosis present

## 2018-11-30 DIAGNOSIS — Z791 Long term (current) use of non-steroidal anti-inflammatories (NSAID): Secondary | ICD-10-CM

## 2018-11-30 LAB — COMPREHENSIVE METABOLIC PANEL
ALT: 15 U/L (ref 0–44)
AST: 15 U/L (ref 15–41)
Albumin: 3.8 g/dL (ref 3.5–5.0)
Alkaline Phosphatase: 81 U/L (ref 38–126)
Anion gap: 11 (ref 5–15)
BUN: 28 mg/dL — ABNORMAL HIGH (ref 6–20)
CO2: 17 mmol/L — ABNORMAL LOW (ref 22–32)
Calcium: 9.2 mg/dL (ref 8.9–10.3)
Chloride: 106 mmol/L (ref 98–111)
Creatinine, Ser: 1.46 mg/dL — ABNORMAL HIGH (ref 0.44–1.00)
GFR calc Af Amer: 59 mL/min — ABNORMAL LOW (ref >60)
GFR calc non Af Amer: 51 mL/min — ABNORMAL LOW (ref >60)
Glucose, Bld: 464 mg/dL — ABNORMAL HIGH (ref 70–99)
Potassium: 4.6 mmol/L (ref 3.5–5.1)
Sodium: 134 mmol/L — ABNORMAL LOW (ref 135–145)
Total Bilirubin: 0.4 mg/dL (ref 0.3–1.2)
Total Protein: 6.3 g/dL — ABNORMAL LOW (ref 6.5–8.1)

## 2018-11-30 LAB — CBC WITH DIFFERENTIAL/PLATELET
Abs Immature Granulocytes: 0.08 10*3/uL — ABNORMAL HIGH (ref 0.00–0.07)
Basophils Absolute: 0.1 10*3/uL (ref 0.0–0.1)
Basophils Relative: 1 %
Eosinophils Absolute: 0.2 10*3/uL (ref 0.0–0.5)
Eosinophils Relative: 2 %
HCT: 38.6 % (ref 36.0–46.0)
Hemoglobin: 12.6 g/dL (ref 12.0–15.0)
Immature Granulocytes: 1 %
Lymphocytes Relative: 32 %
Lymphs Abs: 3.9 10*3/uL (ref 0.7–4.0)
MCH: 27.3 pg (ref 26.0–34.0)
MCHC: 32.6 g/dL (ref 30.0–36.0)
MCV: 83.5 fL (ref 80.0–100.0)
Monocytes Absolute: 1.1 10*3/uL — ABNORMAL HIGH (ref 0.1–1.0)
Monocytes Relative: 9 %
Neutro Abs: 6.9 10*3/uL (ref 1.7–7.7)
Neutrophils Relative %: 55 %
Platelets: 297 10*3/uL (ref 150–400)
RBC: 4.62 MIL/uL (ref 3.87–5.11)
RDW: 13.2 % (ref 11.5–15.5)
WBC: 12.2 10*3/uL — ABNORMAL HIGH (ref 4.0–10.5)
nRBC: 0 % (ref 0.0–0.2)

## 2018-11-30 LAB — GLUCOSE, CAPILLARY
Glucose-Capillary: 283 mg/dL — ABNORMAL HIGH (ref 70–99)
Glucose-Capillary: 152 mg/dL — ABNORMAL HIGH (ref 70–99)

## 2018-11-30 LAB — POCT I-STAT 7, (LYTES, BLD GAS, ICA,H+H)
Acid-base deficit: 6 mmol/L — ABNORMAL HIGH (ref 0.0–2.0)
Bicarbonate: 18.4 mmol/L — ABNORMAL LOW (ref 20.0–28.0)
Calcium, Ion: 1.19 mmol/L (ref 1.15–1.40)
HCT: 36 % (ref 36.0–46.0)
Hemoglobin: 12.2 g/dL (ref 12.0–15.0)
O2 Saturation: 96 %
Patient temperature: 98.3
Potassium: 4.5 mmol/L (ref 3.5–5.1)
Sodium: 135 mmol/L (ref 135–145)
TCO2: 19 mmol/L — ABNORMAL LOW (ref 22–32)
pCO2 arterial: 32.6 mmHg (ref 32.0–48.0)
pH, Arterial: 7.36 (ref 7.350–7.450)
pO2, Arterial: 85 mmHg (ref 83.0–108.0)

## 2018-11-30 LAB — I-STAT BETA HCG BLOOD, ED (MC, WL, AP ONLY): I-stat hCG, quantitative: <5 m[IU]/mL (ref <5)

## 2018-11-30 LAB — CBG MONITORING, ED
Glucose-Capillary: 421 mg/dL — ABNORMAL HIGH (ref 70–99)
Glucose-Capillary: 333 mg/dL — ABNORMAL HIGH (ref 70–99)

## 2018-11-30 LAB — LACTIC ACID, PLASMA: Lactic Acid, Venous: 3.8 mmol/L (ref 0.5–1.9)

## 2018-11-30 MED ORDER — DEXTROSE-NACL 5-0.45 % IV SOLN: INTRAVENOUS | Status: DC

## 2018-11-30 MED ORDER — INSULIN REGULAR(HUMAN) IN NACL 100-0.9 UT/100ML-% IV SOLN
INTRAVENOUS | Status: DC
  Filled 2018-11-30: qty 100

## 2018-11-30 MED ORDER — METOCLOPRAMIDE HCL 10 MG PO TABS: 10.0000 mg | ORAL_TABLET | Freq: Three times a day (TID) | ORAL | 0 refills | Status: DC

## 2018-11-30 MED ORDER — INSULIN ASPART 100 UNIT/ML ~~LOC~~ SOLN
10.0000 [IU] | Freq: Once | SUBCUTANEOUS | Status: AC
  Administered 2018-11-30: 10 [IU] via INTRAVENOUS

## 2018-11-30 MED ORDER — SODIUM CHLORIDE 0.9 % IV SOLN
INTRAVENOUS | Status: DC
  Administered 2018-12-01: 01:00:00 via INTRAVENOUS

## 2018-11-30 MED ORDER — DEXTROSE 50 % IV SOLN: 0.0000 mL | INTRAVENOUS | Status: DC | PRN

## 2018-11-30 MED ORDER — ACETAMINOPHEN 650 MG RE SUPP: 650.0000 mg | Freq: Four times a day (QID) | RECTAL | Status: DC | PRN

## 2018-11-30 MED ORDER — ACETAMINOPHEN 325 MG PO TABS: 650.0000 mg | ORAL_TABLET | Freq: Four times a day (QID) | ORAL | Status: DC | PRN

## 2018-11-30 MED ORDER — LACTATED RINGERS IV BOLUS
1000.0000 mL | Freq: Once | INTRAVENOUS | Status: AC
  Administered 2018-11-30: 1000 mL via INTRAVENOUS

## 2018-11-30 MED ORDER — ENOXAPARIN SODIUM 40 MG/0.4ML ~~LOC~~ SOLN
40.0000 mg | Freq: Every day | SUBCUTANEOUS | Status: DC
  Administered 2018-12-01 – 2018-12-02 (×2): 40 mg via SUBCUTANEOUS
  Filled 2018-11-30 (×2): qty 0.4

## 2018-11-30 NOTE — ED Triage Notes (Addendum)
Pt from home c/o syncopal episode while sitting on couch, no injury, no fall. Was d/c this morning. Pt then began experiencing CP, N, V, SOB, abdominal pain, and dizziness.   EMS gave 4 mg zofran, 324 ASA< 1 NTG  Pt Alert upon arrival, able to answer questions.  Activitly seizing started at 2006. MD at bedside  HX seizure  and/or pseudoseizures

## 2018-11-30 NOTE — Progress Notes (Signed)
Inpatient Diabetes Program Recommendations  AACE/ADA: New Consensus Statement on Inpatient Glycemic Control (2015)  Target Ranges:  Prepandial:   less than 140 mg/dL      Peak postprandial:   less than 180 mg/dL (1-2 hours)      Critically ill patients:  140 - 180 mg/dL   Lab Results  Component Value Date   GLUCAP 223 (H) 11/29/2018   HGBA1C 10.2 (H) 11/07/2018    Review of Glycemic Control Results for Nancy [name], Nancy [name] (MRN [id number]) as of 11/30/2018 09:03  Ref. Range 11/29/2018 07:55 11/29/2018 12:54 11/29/2018 16:58 11/29/2018 21:01  Glucose-Capillary Latest Ref Range: 70 - 99 mg/dL 396 (H) 239 (H) 312 (H) 223 (H)   Diabetes history: DM 2 Outpatient Diabetes medications:  Lantus 60 units q HS, Humalog 15 units tid with meals  Current orders for Inpatient glycemic control:  Lantus 60 units daily, Novolog 12 units tid with meals Novolog resistant tid with meals Inpatient Diabetes Program Recommendations:   Consider increasing Lantus to 70 units QHS and increasing Novolog to 15 units TID (assuming patient is consuming >50% of meals).   She likely needs a DM medication that could help with insulin resistance.   Thanks, Bronson Curb, MSN, RNC-OB Diabetes Coordinator [phone] (8a-5p)

## 2018-11-30 NOTE — ED Notes (Signed)
Pt voided on self. Pt cleaned and linens changed.Pure wick in place with depends.

## 2018-11-30 NOTE — Discharge Summary (Signed)
Physician Discharge Summary  VELTA MCMORRIS BWI:[id number] DOB: 29-Jun-2000 DOA: 11/27/2018  PCP: Claiborne Rigg, NP  Admit date: 11/27/2018 Discharge date: 11/30/2018  Admitted From: Home Disposition:  Home  Recommendations for Outpatient Follow-up:  1. Follow up with PCP in 1-2 weeks  Discharge Condition:Stable CODE STATUS:Full Diet recommendation: Diabetic   Brief/Interim Summary: 18 y.o.femalewith medical history significant fortype 1 diabetes mellitus, seizures and/or pseudoseizures, depression with anxiety, hypertension, and chronic pain, now presenting to the emergency department with abdominal pain, general malaise, nausea, vomiting, and possible seizure. Patient reports that she developed increase in her chronic abdominal pain approximately 3 days ago and has had increase in her chronic nausea and vomiting over the same interval. She reports seeing some dark gritty material in the vomitus. She has had some loose stools but denies diarrhea per se. She denies any fevers, chills, cough, or shortness of breath. Her chronic chest pain has not been bothering her lately. She felt as though she was having a seizure earlier today, denies any injury associated with this, denies incontinence, and states that there is no witnesses. She called EMS and was found to have CBG "high," was treated with 250 cc of IV fluid, and brought into the ED.  ED Course:Upon arrival to the ED, patient is found to be afebrile, saturating well on room air, mildly tachypneic, tachycardic to the 130s, and with stable blood pressure. EKG features sinus tachycardia with rate 129 and nonspecific ST abnormality that is unchanged from prior. Chemistry panel is concerning for a glucose of 861 with bicarbonate of 15 and anion gap 15. CBC is unremarkable. High-sensitivity troponin is undetectable. Lipase was normal. Urinalysis notable for glucose urea but no ketones. Patient was given a liter of normal  saline, morphine, Zofran, IV potassium, IV NovoLog bolus, and was started on insulin infusion in the ED. COVID-19 neg  Discharge Diagnoses:  Principal Problem:   Uncontrolled diabetes mellitus with hyperglycemia (HCC) Active Problems:   DM (diabetes mellitus), type 1, uncontrolled (HCC)   Conversion disorder with attacks or seizures, acute episode, with psychological stressor   Severe recurrent major depression without psychotic features (HCC)   Uncontrolled diabetes mellitus (HCC)  1.Uncontrolled type I DM with hyperglycemia, possible DKA -Presented with abdominal pain, N/V, and is found to have serum glucose of 861 with bicarb 15, normal AG, no urine ketones, and beta-hydroxybutyrate pending  - She claims adherence with insulin regimen, denies illicit substance use, and does not appear to have any severe acute illness as precipitant  - A1c was 10.2% earlier this month -She was fluid-resuscitated with NS in ED and started on insulin infusion -Pt now transitioned to subq insulin.  2.Abdominal pain, N/V -Patient has chronic abdominal pain, frequent N/V, and reports N/V for the past 3 days with some coffee-ground material -Abdominal exam benign -Chronic abd pain was addressed one month ago. 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 No result with lactulose. Results noted with reglan with golytely bowel prep  3.Depression, anxiety -Continue Seroquel, Remeron, Lamictal, Inderal, prn Vistaril -Seems stable at this time  4.Seizures -Patient has hx of seizures and/or pseudoseizures -Continue Topomax as tolerated  5.  Hypertension  -  BP low-normal in ED and Norvasc held on admission, Inderal continued with holding parameters  - Remains stable   Discharge Instructions   Allergies as of 11/30/2018      Reactions   Ibuprofen Other (See Comments)   GI MD said to not take this anymore   Oatmeal Rash      Medication List    STOP taking these medications   amLODipine 5 MG tablet Commonly known as: NORVASC     TAKE these medications   atorvastatin 20 MG tablet Commonly known as: LIPITOR Take 1 tablet (20 mg total) by mouth daily at 6 PM.   busPIRone 10 MG tablet Commonly known as: BUSPAR Take 10 mg by mouth 3 (three) times daily.   dicyclomine 10 MG capsule Commonly known as: BENTYL Take 10 mg by mouth 3 (three) times daily as needed (for abdominal pain).   escitalopram 20 MG tablet Commonly known as: LEXAPRO Take 20 mg by mouth daily.   FLUoxetine 40 MG capsule Commonly known as: PROZAC Take 40 mg by mouth daily.   hydrOXYzine 50 MG tablet Commonly known as: ATARAX/VISTARIL Take 1 tablet (50 mg total) by mouth 3 (three) times daily as needed for anxiety. What changed: when to take this   insulin glargine 100 UNIT/ML injection Commonly known as: LANTUS Inject 0.6 mLs (60 Units total) into the skin at bedtime. For diabetes management   insulin lispro 100 UNIT/ML injection Commonly known as: HUMALOG Inject 0.15 mLs (15 Units total) into the skin 2 (two) times daily with a meal.   lamoTRIgine 25 MG tablet Commonly known as: LAMICTAL Take 2 tablets (50 mg total) by mouth 2 (two) times daily. For mood stabilization   lubiprostone 24 MCG capsule Commonly known as: AMITIZA Take 1 capsule (24 mcg total) by mouth 2 (two) times daily with a meal.   meloxicam 7.5 MG tablet Commonly known as: MOBIC Take 7.5 mg by mouth daily as needed for pain.   metoCLOPramide 10 MG tablet Commonly known as: REGLAN Take 1 tablet (10 mg total) by mouth 4 (four) times daily -  before meals and at  bedtime.   mirtazapine 7.5 MG tablet Commonly known as: REMERON Take 1 tablet (7.5 mg total) by mouth at bedtime. For depression/sleep   multivitamin with minerals Tabs tablet Take 1 tablet by mouth daily.   pantoprazole 40 MG tablet Commonly known as: PROTONIX Take 1 tablet (40 mg total) by mouth 2 (two) times daily. For acid reflux   Pazeo 0.7 % Soln Generic drug: Olopatadine HCl Place 3 drops into both eyes 2 (two) times daily.   promethazine 25 MG suppository Commonly known as: PHENERGAN Place 25 mg rectally every 8 (eight) hours as needed for nausea or vomiting.   propranolol 20 MG tablet Commonly known as: INDERAL Take 1 tablet (20 mg total) by mouth 3 (three) times daily. For anxiety/HTN   QUEtiapine 200 MG tablet Commonly known as: SEROQUEL Take 400 mg by mouth at bedtime.   QUEtiapine 25 MG tablet Commonly known as: SEROQUEL Take 1 tablet (25 mg total) by mouth 2 (two) times daily. For agitation   Restasis 0.05 % ophthalmic emulsion Generic drug: cycloSPORINE Place 1 drop into both eyes 2 (two) times daily.   topiramate 25 MG tablet Commonly known as: TOPAMAX Take 1 tablet (25 mg total) by mouth 2 (two) times daily. For seizure activities      Follow-up Information    Claiborne RiggFleming, Zelda W, NP. Schedule an appointment as soon as  possible for a visit in 1 week(s).   Specialty: Nurse Practitioner Contact information: [address] [place] [address] [phone]        Pricilla Riffleoss, Paula V, MD .   Specialty: Cardiology Contact information: [address] [place] [address] [phone]          Allergies  Allergen Reactions  . Ibuprofen Other (See Comments)    GI MD said to not take this anymore  . Oatmeal Rash     Procedures/Studies: Dg Chest Port 1 View  Result Date: 11/07/2018 CLINICAL DATA:  Left chest pain. EXAM: PORTABLE CHEST 1 VIEW COMPARISON:  10/30/2018 FINDINGS: The heart size and mediastinal contours are  within normal limits. Both lungs are clear. The visualized skeletal structures are unremarkable. IMPRESSION: Normal examination. Electronically Signed   By: Beckie SaltsSteven  Reid M.D.   On: 11/07/2018 06:49   Dg Abd Acute 2+v W 1v Chest  Result Date: 11/01/2018 CLINICAL DATA:  Chest pain and abdominal pain, worsening EXAM: DG ABDOMEN ACUTE W/ 1V CHEST COMPARISON:  10/08/18 FINDINGS: Normal heart size and mediastinal contours. No acute infiltrate or edema. No effusion or pneumothorax. No acute osseous findings. Normal bowel gas pattern. Mild to moderate stool volume in the left colon without rectal distention. No concerning mass effect, gas collection, or calcification. IMPRESSION: Negative abdominal radiographs.  No acute cardiopulmonary disease. Electronically Signed   By: Marnee SpringJonathon  Watts M.D.   On: 11/01/2018 08:09   Ct Angio Chest/abd/pel For Dissection W And/or Wo Contrast  Result Date: 11/01/2018 CLINICAL DATA:  Acute chest and back pain. EXAM: CT ANGIOGRAPHY CHEST, ABDOMEN AND PELVIS TECHNIQUE: Multidetector CT imaging through the chest, abdomen and pelvis was performed using the standard protocol during bolus administration of intravenous contrast. Multiplanar reconstructed images and MIPs were obtained and reviewed to evaluate the vascular anatomy. CONTRAST:  100mL OMNIPAQUE IOHEXOL 350 MG/ML SOLN COMPARISON:  October 08, 2018. FINDINGS: CTA CHEST FINDINGS Cardiovascular: Preferential opacification of the thoracic aorta. No evidence of thoracic aortic aneurysm or dissection. Normal heart size. No pericardial effusion. Mediastinum/Nodes: No enlarged mediastinal, hilar, or axillary lymph nodes. Thyroid gland, trachea, and esophagus demonstrate no significant findings. Lungs/Pleura: Lungs are clear. No pleural effusion or pneumothorax. Musculoskeletal: No chest wall abnormality. No acute or significant osseous findings. Review of the MIP images confirms the above findings. CTA ABDOMEN AND PELVIS FINDINGS  VASCULAR Aorta: Normal caliber aorta without aneurysm, dissection, vasculitis or significant stenosis. Celiac: Patent without evidence of aneurysm, dissection, vasculitis or significant stenosis. SMA: Patent without evidence of aneurysm, dissection, vasculitis or significant stenosis. Renals: Both renal arteries are patent without evidence of aneurysm, dissection, vasculitis, fibromuscular dysplasia or significant stenosis. IMA: Patent without evidence of aneurysm, dissection, vasculitis or significant stenosis. Inflow: Patent without evidence of aneurysm, dissection, vasculitis or significant stenosis. Veins: No obvious venous abnormality within the limitations of this arterial phase study. Review of the MIP images confirms the above findings. NON-VASCULAR Hepatobiliary: No focal liver abnormality is seen. No gallstones, gallbladder wall thickening, or biliary dilatation. Pancreas: Unremarkable. No pancreatic ductal dilatation or surrounding inflammatory changes. Spleen: Normal in size without focal abnormality. Adrenals/Urinary Tract: Adrenal glands are unremarkable. Kidneys are normal, without renal calculi, focal lesion, or hydronephrosis. Bladder is unremarkable. Stomach/Bowel: Stomach is within normal limits. Appendix appears normal. No evidence of bowel wall thickening, distention, or inflammatory changes. Lymphatic: No significant vascular findings are present. No enlarged abdominal or pelvic lymph nodes. Reproductive: Uterus and bilateral adnexa are unremarkable. Other: No abdominal wall hernia or  abnormality. No abdominopelvic ascites. Musculoskeletal: No acute or significant osseous findings. Review of the MIP images confirms the above findings. IMPRESSION: No evidence of thoracic or abdominal aortic dissection or aneurysm. No significant abnormality seen in the chest, abdomen or pelvis. Electronically Signed   By: Lupita Raider M.D.   On: 11/01/2018 09:28     Subjective: Eager to go home  today  Discharge Exam: Vitals:   11/30/18 0906 11/30/18 1140  BP: (!) 96/59 (!) 93/55  Pulse: 86 97  Resp: 18 20  Temp:  98.8 F (37.1 C)  SpO2:  98%   Vitals:   11/29/18 2140 11/30/18 0540 11/30/18 0906 11/30/18 1140  BP:  98/71 (!) 96/59 (!) 93/55  Pulse:  95 86 97  Resp: (!) 22  18 20   Temp:  98.9 F (37.2 C)  98.8 F (37.1 C)  TempSrc:  Oral  Oral  SpO2:  100%  98%  Weight:      Height:        General: Pt is alert, awake, not in acute distress Cardiovascular: RRR, S1/S2 +, no rubs, no gallops Respiratory: CTA bilaterally, no wheezing, no rhonchi Abdominal: Soft, NT, ND, bowel sounds + Extremities: no edema, no cyanosis   The results of significant diagnostics from this hospitalization (including imaging, microbiology, ancillary and laboratory) are listed below for reference.     Microbiology: Recent Results (from the past 240 hour(s))  SARS CORONAVIRUS 2 (TAT 6-24 HRS) Nasopharyngeal Nasopharyngeal Swab     Status: None   Collection Time: 11/28/18  1:04 AM   Specimen: Nasopharyngeal Swab  Result Value Ref Range Status   SARS Coronavirus 2 NEGATIVE NEGATIVE Final    Comment: (NOTE) SARS-CoV-2 target nucleic acids are NOT DETECTED. The SARS-CoV-2 RNA is generally detectable in upper and lower respiratory specimens during the acute phase of infection. Negative results do not preclude SARS-CoV-2 infection, do not rule out co-infections with other pathogens, and should not be used as the sole basis for treatment or other patient management decisions. Negative results must be combined with clinical observations, patient history, and epidemiological information. The expected result is Negative. Fact Sheet for Patients: 11/30/18 Fact Sheet for Healthcare Providers: HairSlick.no This test is not yet approved or cleared by the [place] FDA and  has been authorized for detection and/or diagnosis of  SARS-CoV-2 by FDA under an Emergency Use Authorization (EUA). This EUA will remain  in effect (meaning this test can be used) for the duration of the COVID-19 declaration under Section 56 4(b)(1) of the Act, 21 U.S.C. section 360bbb-3(b)(1), unless the authorization is terminated or revoked sooner. Performed at [facility], [address] [place], [place]      Labs: BNP (last 3 results) Recent Labs    01/29/18 2134 09/10/18 1424 09/12/18 2244  BNP 12.4 13.1 13.8   Basic Metabolic Panel: Recent Labs  Lab 11/27/18 2218 11/27/18 2330 11/28/18 0012 11/28/18 0600 11/28/18 0823 11/29/18 0234  NA 135 135 136 134* 134* 133*  K 3.6 3.8 3.8 4.1 4.1 3.9  CL 102 101  --  103 104 102  CO2 18* 21*  --  23 23 24   GLUCOSE 393* 390*  --  332* 473* 360*  BUN 8 8  --  7 7 13   CREATININE 0.87 0.78  --  0.56 0.64 0.79  CALCIUM 9.1 9.2  --  8.0* 8.1* 8.5*   Liver Function Tests: Recent Labs  Lab 11/27/18 2135 11/29/18 0234  AST  21 12*  ALT 13 13  ALKPHOS 95 70  BILITOT 0.3 0.1*  PROT 6.3* 5.6*  ALBUMIN 3.6 3.1*   Recent Labs  Lab 11/27/18 2057  LIPASE 26   No results for input(s): AMMONIA in the last 168 hours. CBC: Recent Labs  Lab 11/27/18 2057 11/28/18 0012 11/28/18 0600  WBC 9.3  --  8.3  HGB 12.1 13.3 10.8*  HCT 38.4 39.0 33.6*  MCV 87.5  --  84.4  PLT 283  --  251   Cardiac Enzymes: No results for input(s): CKTOTAL, CKMB, CKMBINDEX, TROPONINI in the last 168 hours. BNP: Invalid input(s): POCBNP CBG: Recent Labs  Lab 11/29/18 1254 11/29/18 1658 11/29/18 2101 11/30/18 0850 11/30/18 1142  GLUCAP 239* 312* 223* 283* 152*   D-Dimer No results for input(s): DDIMER in the last 72 hours. Hgb A1c No results for input(s): HGBA1C in the last 72 hours. Lipid Profile No results for input(s): CHOL, HDL, LDLCALC, TRIG, CHOLHDL, LDLDIRECT in the last 72 hours. Thyroid function studies No results for input(s): TSH, T4TOTAL, T3FREE, THYROIDAB  in the last 72 hours.  Invalid input(s): FREET3 Anemia work up No results for input(s): VITAMINB12, FOLATE, FERRITIN, TIBC, IRON, RETICCTPCT in the last 72 hours. Urinalysis    Component Value Date/Time   COLORURINE STRAW (A) 11/27/2018 2105   APPEARANCEUR HAZY (A) 11/27/2018 2105   LABSPEC 1.030 11/27/2018 2105   PHURINE 6.0 11/27/2018 2105   GLUCOSEU >=500 (A) 11/27/2018 2105   HGBUR NEGATIVE 11/27/2018 2105   BILIRUBINUR NEGATIVE 11/27/2018 2105   BILIRUBINUR negative 07/13/2018 [place] 11/27/2018 2105   PROTEINUR NEGATIVE 11/27/2018 2105   UROBILINOGEN 0.2 07/13/2018 1651   UROBILINOGEN 1.0 07/09/2017 1324   NITRITE NEGATIVE 11/27/2018 2105   LEUKOCYTESUR NEGATIVE 11/27/2018 2105   Sepsis Labs Invalid input(s): PROCALCITONIN,  WBC,  LACTICIDVEN Microbiology Recent Results (from the past 240 hour(s))  SARS CORONAVIRUS 2 (TAT 6-24 HRS) Nasopharyngeal Nasopharyngeal Swab     Status: None   Collection Time: 11/28/18  1:04 AM   Specimen: Nasopharyngeal Swab  Result Value Ref Range Status   SARS Coronavirus 2 NEGATIVE NEGATIVE Final    Comment: (NOTE) SARS-CoV-2 target nucleic acids are NOT DETECTED. The SARS-CoV-2 RNA is generally detectable in upper and lower respiratory specimens during the acute phase of infection. Negative results do not preclude SARS-CoV-2 infection, do not rule out co-infections with other pathogens, and should not be used as the sole basis for treatment or other patient management decisions. Negative results must be combined with clinical observations, patient history, and epidemiological information. The expected result is Negative. Fact Sheet for Patients: SugarRoll.be Fact Sheet for Healthcare Providers: https://www.woods-mathews.com/ This test is not yet approved or cleared by the [place] FDA and  has been authorized for detection and/or diagnosis of SARS-CoV-2 by FDA under an  Emergency Use Authorization (EUA). This EUA will remain  in effect (meaning this test can be used) for the duration of the COVID-19 declaration under Section 56 4(b)(1) of the Act, 21 U.S.C. section 360bbb-3(b)(1), unless the authorization is terminated or revoked sooner. Performed at [facility], [place] [address] [place], [place] [address]    Time spent:30 min  SIGNED:   Marylu Lund, MD  [facility] 11/30/2018, 11:55 AM  If 7PM-7AM, please contact night-coverage

## 2018-11-30 NOTE — ED Notes (Signed)
Respiratory call to request arterial blood gas

## 2018-11-30 NOTE — ED Provider Notes (Signed)
[facility] EMERGENCY DEPARTMENT Provider Note   CSN: [id number] Arrival date & time: 11/30/18  2007     History   Chief Complaint No chief complaint on file.   HPI Nancy [name] is a 18 y.o. female.     HPI   18 year old female brought in by EMS.  She called from home.  She reported that she had a syncopal event sitting on the couch.  Afterwards she began experiencing nausea/vomiting, abdominal pain and dizziness.  Of note, she was just discharged from the hospital earlier today.  Shortly after arrival emergency room she had a episode which was reported as seizure-like activity by nursing.  The time I came to evaluate her this activity had stopped but she appeared to be possibly postictal.  She was reportedly speaking and answering commands prior to this but is only spotting to painful stimuli on my initial exam.  Past Medical History:  Diagnosis Date  . Acanthosis nigricans   . Anxiety   . CHF (congestive heart failure) (HCC)   . Chronic lower back pain   . Depression   . DKA, type 1 (HCC) 09/13/2018  . Dyspepsia   . Obesity   . Ovarian cyst    pt is not aware of this hx (11/24/2017)  . Pre-diabetes   . 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 Premature baby   . Seizures (HCC)   . Type II diabetes mellitus (HCC)    insulin dependant    Patient Active Problem List   Diagnosis Date Noted  . Uncontrolled diabetes mellitus (HCC) 11/28/2018  . Uncontrolled diabetes mellitus with hyperglycemia (HCC) 11/27/2018  . 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  . MDD (major depressive disorder), recurrent episode, severe (HCC) 11/06/2018  . Nonspecific abnormal electrocardiogram (ECG) (EKG)   . Chest pain of uncertain etiology   . DKA, type 1 (HCC) 10/08/2018  . Hypertensive urgency 10/08/2018  . Conversion disorder with attacks or seizures, acute episode, with psychological stressor 09/16/2018  . MDD (major depressive  disorder), recurrent severe, without psychosis (HCC)   . Syncope 01/30/2018  . Orthostatic hypotension 01/24/2018  . Chronic abdominal pain 12/24/2017  . Chest pain 12/19/2017  . Nausea and vomiting 08/21/2017  . Generalized abdominal pain 08/21/2017  . Non compliance with medical treatment 01/27/2012  . Adjustment disorder 09/16/2011  . DM (diabetes mellitus), type 1, uncontrolled (HCC) 09/15/2011  . Acanthosis nigricans   . Goiter   . Obesity 06/14/2010  . Hypertension 06/14/2010    Past Surgical History:  Procedure Laterality Date  . ABDOMINAL HERNIA REPAIR     "I was a baby"  . BIOPSY  10/12/2018   Procedure: BIOPSY;  Surgeon: Lynann Bologna, MD;  Location: [facility] ENDOSCOPY;  Service: Endoscopy;;  . ESOPHAGOGASTRODUODENOSCOPY (EGD) WITH PROPOFOL N/A 10/12/2018   Procedure: ESOPHAGOGASTRODUODENOSCOPY (EGD) WITH PROPOFOL;  Surgeon: Lynann Bologna, MD;  Location: [facility] ENDOSCOPY;  Service: Endoscopy;  Laterality: N/A;  . HERNIA REPAIR    . LEFT HEART CATH AND CORONARY ANGIOGRAPHY N/A 10/13/2018   Procedure: LEFT HEART CATH AND CORONARY ANGIOGRAPHY;  Surgeon: Kathleene Hazel, MD;  Location: [facility] INVASIVE CV LAB;  Service: Cardiovascular;  Laterality: N/A;  . TONSILLECTOMY AND ADENOIDECTOMY    . WISDOM TOOTH EXTRACTION  2017     OB History   No obstetric history on file.      Home Medications    Prior to Admission medications   Medication Sig Start Date End Date Taking? Authorizing Provider  atorvastatin (LIPITOR) 20 MG tablet Take 1 tablet (20 mg total) by mouth daily at 6 PM. 10/05/18   Storm FriskWright, Patrick E, MD  busPIRone (BUSPAR) 10 MG tablet Take 10 mg by mouth 3 (three) times daily. 11/19/18   [provider]  dicyclomine (BENTYL) 10 MG capsule Take 10 mg by mouth 3 (three) times daily as needed (for abdominal pain).    [provider]  escitalopram (LEXAPRO) 20 MG tablet Take 20 mg by mouth daily.    [provider]  FLUoxetine (PROZAC) 40 MG capsule  Take 40 mg by mouth daily.    [provider]  hydrOXYzine (ATARAX/VISTARIL) 50 MG tablet Take 1 tablet (50 mg total) by mouth 3 (three) times daily as needed for anxiety. Patient taking differently: Take 50 mg by mouth 3 (three) times daily.  11/11/18   Armandina StammerNwoko, Agnes I, NP  insulin glargine (LANTUS) 100 UNIT/ML injection Inject 0.6 mLs (60 Units total) into the skin at bedtime. For diabetes management 11/11/18   Armandina StammerNwoko, Agnes I, NP  insulin lispro (HUMALOG) 100 UNIT/ML injection Inject 0.15 mLs (15 Units total) into the skin 2 (two) times daily with a meal. 10/05/18   Storm FriskWright, Patrick E, MD  lamoTRIgine (LAMICTAL) 25 MG tablet Take 2 tablets (50 mg total) by mouth 2 (two) times daily. For mood stabilization 11/11/18   Armandina StammerNwoko, Agnes I, NP  lubiprostone (AMITIZA) 24 MCG capsule Take 1 capsule (24 mcg total) by mouth 2 (two) times daily with a meal. 10/27/18   Esterwood, Amy S, PA-C  meloxicam (MOBIC) 7.5 MG tablet Take 7.5 mg by mouth daily as needed for pain.    [provider]  metoCLOPramide (REGLAN) 10 MG tablet Take 1 tablet (10 mg total) by mouth 4 (four) times daily -  before meals and at bedtime. 11/30/18 12/30/18  Jerald Kiefhiu, Kenetha Cozza K, MD  mirtazapine (REMERON) 7.5 MG tablet Take 1 tablet (7.5 mg total) by mouth at bedtime. For depression/sleep 11/11/18   Armandina StammerNwoko, Agnes I, NP  Multiple Vitamin (MULTIVITAMIN WITH MINERALS) TABS tablet Take 1 tablet by mouth daily. 09/18/18   Clapacs, Jackquline DenmarkJohn T, MD  Olopatadine HCl (PAZEO) 0.7 % SOLN Place 3 drops into both eyes 2 (two) times daily.    [provider]  pantoprazole (PROTONIX) 40 MG tablet Take 1 tablet (40 mg total) by mouth 2 (two) times daily. For acid reflux 11/11/18   Armandina StammerNwoko, Agnes I, NP  promethazine (PHENERGAN) 25 MG suppository Place 25 mg rectally every 8 (eight) hours as needed for nausea or vomiting.    [provider]  propranolol (INDERAL) 20 MG tablet Take 1 tablet (20 mg total) by mouth 3 (three) times daily. For  anxiety/HTN 11/11/18   Armandina StammerNwoko, Agnes I, NP  QUEtiapine (SEROQUEL) 200 MG tablet Take 400 mg by mouth at bedtime.     [provider]  QUEtiapine (SEROQUEL) 25 MG tablet Take 1 tablet (25 mg total) by mouth 2 (two) times daily. For agitation 11/11/18   Armandina StammerNwoko, Agnes I, NP  RESTASIS 0.05 % ophthalmic emulsion Place 1 drop into both eyes 2 (two) times daily. 11/16/18   [provider]  topiramate (TOPAMAX) 25 MG tablet Take 1 tablet (25 mg total) by mouth 2 (two) times daily. For seizure activities 11/11/18   Sanjuana KavaNwoko, Agnes I, NP    Family History Family History  Problem Relation Age of Onset  . Diabetes Mother   . Hypertension Mother   . Obesity Mother   . Asthma Mother   .  Allergic rhinitis Mother   . Eczema Mother   . Cervical cancer Mother   . Diabetes Father   . Hypertension Father   . Obesity Father   . Hyperlipidemia Father   . Hypertension Paternal Aunt   . Hypertension Maternal Grandfather   . Colon cancer Maternal Grandfather   . Diabetes Paternal Grandmother   . Obesity Paternal Grandmother   . Diabetes Paternal Grandfather   . Obesity Paternal Grandfather   . Angioedema Neg Hx   . Immunodeficiency Neg Hx   . Urticaria Neg Hx   . Stomach cancer Neg Hx   . Esophageal cancer Neg Hx     Social History Social History   Tobacco Use  . Smoking status: Never Smoker  . Smokeless tobacco: Never Used  Substance Use Topics  . Alcohol use: No    Alcohol/week: 0.0 standard drinks  . Drug use: No     Allergies   Ibuprofen and Oatmeal   Review of Systems Review of Systems  Level 5 caveat because patient is postictal. Physical Exam Updated Vital Signs BP (!) 106/54   Pulse (!) 121   Temp 98.8 F (37.1 C) (Oral)   Resp (!) 32   Ht 5\' 3"  (1.6 m)   Wt 89.8 kg   SpO2 96%   BMI 35.07 kg/m   Physical Exam Vitals signs and nursing note reviewed.  Constitutional:      Appearance: She is well-developed. She is obese.     Comments: Laying in bed.   Appears to be sleeping.  HENT:     Head: Normocephalic and atraumatic.  Eyes:     General:        Right eye: No discharge.        Left eye: No discharge.     Conjunctiva/sclera: Conjunctivae normal.  Neck:     Musculoskeletal: Neck supple.  Cardiovascular:     Rate and Rhythm: Regular rhythm. Tachycardia present.     Heart sounds: Normal heart sounds. No murmur. No friction rub. No gallop.   Pulmonary:     Effort: Pulmonary effort is normal. No respiratory distress.     Breath sounds: Normal breath sounds.  Abdominal:     General: There is no distension.     Palpations: Abdomen is soft.     Tenderness: There is no abdominal tenderness.  Musculoskeletal:        General: No tenderness.  Skin:    General: Skin is warm and dry.  Neurological:     Mental Status: She is alert.     Comments: Drowsy.  May get some sounds but not speaking/answering questions.  Not following commands.  Localizes pain and moves all extremities to painful stimuli.  Psychiatric:        Behavior: Behavior normal.        Thought Content: Thought content normal.      ED Treatments / Results  Labs (all labs ordered are listed, but only abnormal results are displayed) Labs Reviewed  CBC WITH DIFFERENTIAL/PLATELET - Abnormal; Notable for the following components:      Result Value   WBC 12.2 (*)    Monocytes Absolute 1.1 (*)    Abs Immature Granulocytes 0.08 (*)    All other components within normal limits  COMPREHENSIVE METABOLIC PANEL - Abnormal; Notable for the following components:   Sodium 134 (*)    CO2 17 (*)    Glucose, Bld 464 (*)    BUN 28 (*)    Creatinine, Ser  1.46 (*)    Total Protein 6.3 (*)    GFR calc non Af Amer 51 (*)    GFR calc Af Amer 59 (*)    All other components within normal limits  LACTIC ACID, PLASMA - Abnormal; Notable for the following components:   Lactic Acid, Venous 3.8 (*)    All other components within normal limits  CBG MONITORING, ED - Abnormal; Notable for  the following components:   Glucose-Capillary 421 (*)    All other components within normal limits  POCT I-STAT 7, (LYTES, BLD GAS, ICA,H+H) - Abnormal; Notable for the following components:   Bicarbonate 18.4 (*)    TCO2 19 (*)    Acid-base deficit 6.0 (*)    All other components within normal limits  CBG MONITORING, ED - Abnormal; Notable for the following components:   Glucose-Capillary 333 (*)    All other components within normal limits  URINALYSIS, ROUTINE W REFLEX MICROSCOPIC  LACTIC ACID, PLASMA  I-STAT ARTERIAL BLOOD GAS, ED  I-STAT BETA HCG BLOOD, ED ([facility], [facility], [facility] ONLY)    EKG EKG Interpretation  Date/Time:  Tuesday November 30 2018 20:25:54 EST Ventricular Rate:  138 PR Interval:    QRS Duration: 85 QT Interval:  299 QTC Calculation: 453 R Axis:   72 Text Interpretation: Sinus tachycardia Ventricular premature complex Probable left atrial enlargement Confirmed by Raeford RazorKohut, Huston Stonehocker [id number]) on 11/30/2018 8:39:53 PM   Radiology Ct Head Wo Contrast  Result Date: 11/30/2018 CLINICAL DATA:  Syncope, altered LOC EXAM: CT HEAD WITHOUT CONTRAST TECHNIQUE: Contiguous axial images were obtained from the base of the skull through the vertex without intravenous contrast. COMPARISON:  CT brain 10/30/2018 FINDINGS: Brain: No evidence of acute infarction, hemorrhage, hydrocephalus, extra-axial collection or mass lesion/mass effect. Vascular: No hyperdense vessel or unexpected calcification. Skull: Normal. Negative for fracture or focal lesion. Sinuses/Orbits: Mild mucosal thickening in the sinuses. Other: None IMPRESSION: Negative non contrasted CT appearance of the brain Electronically Signed   By: Jasmine PangKim  Fujinaga M.D.   On: 11/30/2018 22:51    Procedures Procedures (including critical care time)  Medications Ordered in ED Medications  lactated ringers bolus 1,000 mL (1,000 mLs Intravenous New Bag/Given 11/30/18 2029)  lactated ringers bolus 1,000 mL (1,000 mLs Intravenous New  Bag/Given 11/30/18 2116)  insulin aspart (novoLOG) injection 10 Units (10 Units Intravenous Given 11/30/18 2320)     Initial Impression / Assessment and Plan / ED Course  I have reviewed the triage vital signs and the nursing notes.  Pertinent labs & imaging results that were available during my care of the patient were reviewed by me and considered in my medical decision making (see chart for details).        18 year old female with possible syncopal event earlier today and then complaining of abdominal pain and nausea/vomiting.  Per review of records, patient has numerous evaluations in the emergency room and admissions.  History of chronic abdominal pain.  Also history of seizures versus pseudoseizure.  She is hyperglycemic with metabolic acidosis but I suspect this is from a lactic acidosis.  She does not have increased anion gap.  She truly did have a seizure this could explain her lactic acid being elevated.  She was also incontinent of urine during this event.  Tachycardia is persistent although improved with IV fluids.  New AKI.  She remains very drowsy.  She is not responding to me verbally or following commands although she has been noted to reposition herself in the bed several times  by both nursing and myself. May be some psychogenic component to her presentation.   Final Clinical Impressions(s) / ED Diagnoses   Final diagnoses:  Seizure-like activity [facility])  Hyperglycemia    ED Discharge Orders    None       Raeford Razor, MD 12/03/18 2030

## 2018-11-30 NOTE — ED Notes (Signed)
Date and time results received: 11/30/18 11:01 PM   Test: Lactic  Critical Value: 3.8  Name of Provider Notified: Dr. Wilson Singer  MD, informed pt is not responding RN's questions but has been moving in bed.

## 2018-11-30 NOTE — ED Notes (Signed)
Patient transported to CT 

## 2018-11-30 NOTE — Progress Notes (Signed)
Nancy [name] to be D/C'd home per MD order. Discussed with the patient and all questions fully answered. VVS, Skin clean, dry and intact without evidence of skin break down, no evidence of skin tears noted. Reglan delivered through [place]. IV catheter discontinued intact. Site without signs and symptoms of complications. Dressing and pressure applied.  An After Visit Summary was printed and given to the patient.  Patient escorted via [place], and D/C home via private auto.  [name]  11/30/2018 2:21 PM

## 2018-11-30 NOTE — H&P (Signed)
TRH H&P    Patient Demographics:    Nancy MelnickRashonda [name], is a 18 y.o. female  MRN: [id number]  DOB - [birth year]  Admit Date - 11/30/2018  Referring MD/NP/PA: Raeford RazorStephen Kohut  Outpatient Primary MD for the patient is Claiborne RiggFleming, Zelda W, NP  Patient coming from:  home  Chief complaint- syncope   HPI:    Nancy MelnickRashonda [name]  is a 18 y.o. female,  w diabetes type 1, , CHF (EF 60-65%), w normal cardiac cath 10/13/2018, h/o esophagitis/ candidiasis, seizure/ and/or pseudoseizures, depression with anxiety w recent admission for Hyperglycemia, apparently presents tonight due to syncope, and in ER had witnessed seizure per nursing staff?  Per mother, syncope at home,  Unwitnessed but she heard a thump. Slight confusion, no tongue laceration,  No incontinence.   In ED,  T 98.8, P 138, R 29, Bp 129/70  Pox 98% onRA Wt 89.8 kg  CT brain IMPRESSION: Negative non contrasted CT appearance of the brain  Wbc 12.2, Hgb 12.6, Plt 297 Na 134, K 4.6, Bun 28, Creatinine 1.46 Ast 15, Alt 15  PH 7.360, PCo2 32.6,  Lactic acid  3.8  Pt will be admitted for syncope, lactic acidosis. nonAG metabolic acidosis     Review of systems:    In addition to the HPI above,  No Fever-chills, No Headache, No changes with Vision or hearing, No problems swallowing food or Liquids, No Chest pain, Cough or Shortness of Breath, No Abdominal pain, No Nausea or Vomiting, bowel movements are regular, No Blood in stool or Urine, No dysuria, No new skin rashes or bruises, No new joints pains-aches,  No new weakness, tingling, numbness in any extremity, No recent weight gain or loss, No polyuria, polydypsia or polyphagia, No significant Mental Stressors.  All other systems reviewed and are negative.    Past History of the following :    Past Medical History:  Diagnosis Date  . Acanthosis nigricans   . Anxiety   . CHF (congestive heart  failure) (HCC)   . Chronic lower back pain   . Depression   . DKA, type 1 (HCC) 09/13/2018  . Dyspepsia   . Obesity   . Ovarian cyst    pt is not aware of this hx (11/24/2017)  . Pre-diabetes   . 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 Premature baby   . Seizures (HCC)   . Type II diabetes mellitus (HCC)    insulin dependant      Past Surgical History:  Procedure Laterality Date  . ABDOMINAL HERNIA REPAIR     "I was a baby"  . BIOPSY  10/12/2018   Procedure: BIOPSY;  Surgeon: Lynann BolognaGupta, Rajesh, MD;  [facility];  Service: Endoscopy;;  . ESOPHAGOGASTRODUODENOSCOPY (EGD) WITH PROPOFOL N/A 10/12/2018   Procedure: ESOPHAGOGASTRODUODENOSCOPY (EGD) WITH PROPOFOL;  Surgeon: Lynann BolognaGupta, Rajesh, MD;  [facility];  Service: Endoscopy;  Laterality: N/A;  . HERNIA REPAIR    . LEFT HEART CATH AND CORONARY ANGIOGRAPHY N/A 10/13/2018   Procedure: LEFT HEART CATH AND CORONARY ANGIOGRAPHY;  Surgeon: Verne CarrowMcAlhany, Christopher  D, MD;  Location: [facility] INVASIVE CV LAB;  Service: Cardiovascular;  Laterality: N/A;  . TONSILLECTOMY AND ADENOIDECTOMY    . WISDOM TOOTH EXTRACTION  2017      Social History:      Social History   Tobacco Use  . Smoking status: Never Smoker  . Smokeless tobacco: Never Used  Substance Use Topics  . Alcohol use: No    Alcohol/week: 0.0 standard drinks       Family History :     Family History  Problem Relation Age of Onset  . Diabetes Mother   . Hypertension Mother   . Obesity Mother   . Asthma Mother   . Allergic rhinitis Mother   . Eczema Mother   . Cervical cancer Mother   . Diabetes Father   . Hypertension Father   . Obesity Father   . Hyperlipidemia Father   . Hypertension Paternal Aunt   . Hypertension Maternal Grandfather   . Colon cancer Maternal Grandfather   . Diabetes Paternal Grandmother   . Obesity Paternal Grandmother   . Diabetes Paternal Grandfather   . Obesity Paternal Grandfather   . Angioedema Neg Hx   . Immunodeficiency Neg Hx    . Urticaria Neg Hx   . Stomach cancer Neg Hx   . Esophageal cancer Neg Hx        Home Medications:   Prior to Admission medications   Medication Sig Start Date End Date Taking? Authorizing Provider  atorvastatin (LIPITOR) 20 MG tablet Take 1 tablet (20 mg total) by mouth daily at 6 PM. 10/05/18   Storm Frisk, MD  busPIRone (BUSPAR) 10 MG tablet Take 10 mg by mouth 3 (three) times daily. 11/19/18   [provider]  dicyclomine (BENTYL) 10 MG capsule Take 10 mg by mouth 3 (three) times daily as needed (for abdominal pain).    [provider]  escitalopram (LEXAPRO) 20 MG tablet Take 20 mg by mouth daily.    [provider]  FLUoxetine (PROZAC) 40 MG capsule Take 40 mg by mouth daily.    [provider]  hydrOXYzine (ATARAX/VISTARIL) 50 MG tablet Take 1 tablet (50 mg total) by mouth 3 (three) times daily as needed for anxiety. Patient taking differently: Take 50 mg by mouth 3 (three) times daily.  11/11/18   Armandina Stammer I, NP  insulin glargine (LANTUS) 100 UNIT/ML injection Inject 0.6 mLs (60 Units total) into the skin at bedtime. For diabetes management 11/11/18   Armandina Stammer I, NP  insulin lispro (HUMALOG) 100 UNIT/ML injection Inject 0.15 mLs (15 Units total) into the skin 2 (two) times daily with a meal. 10/05/18   Storm Frisk, MD  lamoTRIgine (LAMICTAL) 25 MG tablet Take 2 tablets (50 mg total) by mouth 2 (two) times daily. For mood stabilization 11/11/18   Armandina Stammer I, NP  lubiprostone (AMITIZA) 24 MCG capsule Take 1 capsule (24 mcg total) by mouth 2 (two) times daily with a meal. 10/27/18   Esterwood, Amy S, PA-C  meloxicam (MOBIC) 7.5 MG tablet Take 7.5 mg by mouth daily as needed for pain.    [provider]  metoCLOPramide (REGLAN) 10 MG tablet Take 1 tablet (10 mg total) by mouth 4 (four) times daily -  before meals and at bedtime. 11/30/18 12/30/18  Jerald Kief, MD  mirtazapine (REMERON) 7.5 MG tablet Take 1 tablet (7.5 mg  total) by mouth at bedtime. For depression/sleep 11/11/18   Sanjuana Kava, NP  Multiple Vitamin (  MULTIVITAMIN WITH MINERALS) TABS tablet Take 1 tablet by mouth daily. 09/18/18   Clapacs, Madie Reno, MD  Olopatadine HCl (PAZEO) 0.7 % SOLN Place 3 drops into both eyes 2 (two) times daily.    [provider]  pantoprazole (PROTONIX) 40 MG tablet Take 1 tablet (40 mg total) by mouth 2 (two) times daily. For acid reflux 11/11/18   Lindell Spar I, NP  promethazine (PHENERGAN) 25 MG suppository Place 25 mg rectally every 8 (eight) hours as needed for nausea or vomiting.    [provider]  propranolol (INDERAL) 20 MG tablet Take 1 tablet (20 mg total) by mouth 3 (three) times daily. For anxiety/HTN 11/11/18   Lindell Spar I, NP  QUEtiapine (SEROQUEL) 200 MG tablet Take 400 mg by mouth at bedtime.     [provider]  QUEtiapine (SEROQUEL) 25 MG tablet Take 1 tablet (25 mg total) by mouth 2 (two) times daily. For agitation 11/11/18   Lindell Spar I, NP  RESTASIS 0.05 % ophthalmic emulsion Place 1 drop into both eyes 2 (two) times daily. 11/16/18   [provider]  topiramate (TOPAMAX) 25 MG tablet Take 1 tablet (25 mg total) by mouth 2 (two) times daily. For seizure activities 11/11/18   Lindell Spar I, NP     Allergies:     Allergies  Allergen Reactions  . Ibuprofen Other (See Comments)    GI MD said to not take this anymore  . Oatmeal Rash     Physical Exam:   Vitals  Blood pressure (!) 106/54, pulse (!) 121, temperature 98.8 F (37.1 C), temperature source Oral, resp. rate (!) 32, height 5\' 3"  (1.6 m), weight 89.8 kg, SpO2 96 %.  1.  General: axoxo0   2. Psychiatric: Unable to assess  3. Neurologic: cn2-12 intact, reflexes 2+ symmetric, diffuse with downgoing toes bilaterally, motor 5/5 to painful stimuli  4. HEENMT:  Anicteric, pupils 1.23mm symmetric, direct, consensual , intact Neck: no jvd  5. Respiratory : CTAB  6. Cardiovascular : tachy s1, s2,  no m/g/r  7. Gastrointestinal:  Abd: soft, nt, nd, +bs  8. Skin:  Ext: no c/c/e, no rash  9.Musculoskeletal:  Good ROM    Data Review:    CBC Recent Labs  Lab 11/27/18 2057 11/28/18 0012 11/28/18 0600 11/30/18 2022 11/30/18 2131  WBC 9.3  --  8.3 12.2*  --   HGB 12.1 13.3 10.8* 12.6 12.2  HCT 38.4 39.0 33.6* 38.6 36.0  PLT 283  --  251 297  --   MCV 87.5  --  84.4 83.5  --   MCH 27.6  --  27.1 27.3  --   MCHC 31.5  --  32.1 32.6  --   RDW 13.3  --  13.2 13.2  --   LYMPHSABS  --   --   --  3.9  --   MONOABS  --   --   --  1.1*  --   EOSABS  --   --   --  0.2  --   BASOSABS  --   --   --  0.1  --    ------------------------------------------------------------------------------------------------------------------  Results for orders placed or performed during the hospital encounter of 11/30/18 (from the past 48 hour(s))  CBG monitoring, ED     Status: Abnormal   Collection Time: 11/30/18  8:15 PM  Result Value Ref Range   Glucose-Capillary 421 (H) 70 - 99 mg/dL  CBC with Differential  Status: Abnormal   Collection Time: 11/30/18  8:22 PM  Result Value Ref Range   WBC 12.2 (H) 4.0 - 10.5 K/uL   RBC 4.62 3.87 - 5.11 MIL/uL   Hemoglobin 12.6 12.0 - 15.0 g/dL   HCT 16.138.6 09.636.0 - 04.546.0 %   MCV 83.5 80.0 - 100.0 fL   MCH 27.3 26.0 - 34.0 pg   MCHC 32.6 30.0 - 36.0 g/dL   RDW 40.913.2 81.111.5 - 91.415.5 %   Platelets 297 150 - 400 K/uL   nRBC 0.0 0.0 - 0.2 %   Neutrophils Relative % 55 %   Neutro Abs 6.9 1.7 - 7.7 K/uL   Lymphocytes Relative 32 %   Lymphs Abs 3.9 0.7 - 4.0 K/uL   Monocytes Relative 9 %   Monocytes Absolute 1.1 (H) 0.1 - 1.0 K/uL   Eosinophils Relative 2 %   Eosinophils Absolute 0.2 0.0 - 0.5 K/uL   Basophils Relative 1 %   Basophils Absolute 0.1 0.0 - 0.1 K/uL   Immature Granulocytes 1 %   Abs Immature Granulocytes 0.08 (H) 0.00 - 0.07 K/uL    Comment: Performed at [facility], [address] [place], [place] [address]  Comprehensive metabolic  panel     Status: Abnormal   Collection Time: 11/30/18  8:22 PM  Result Value Ref Range   Sodium 134 (L) 135 - 145 mmol/L   Potassium 4.6 3.5 - 5.1 mmol/L   Chloride 106 98 - 111 mmol/L   CO2 17 (L) 22 - 32 mmol/L   Glucose, Bld 464 (H) 70 - 99 mg/dL   BUN 28 (H) 6 - 20 mg/dL   Creatinine, Ser 6.211.46 (H) 0.44 - 1.00 mg/dL   Calcium 9.2 8.9 - 30.810.3 mg/dL   Total Protein 6.3 (L) 6.5 - 8.1 g/dL   Albumin 3.8 3.5 - 5.0 g/dL   AST 15 15 - 41 U/L   ALT 15 0 - 44 U/L   Alkaline Phosphatase 81 38 - 126 U/L   Total Bilirubin 0.4 0.3 - 1.2 mg/dL   GFR calc non Af Amer 51 (L) >60 mL/min   GFR calc Af Amer 59 (L) >60 mL/min   Anion gap 11 5 - 15    Comment: Performed at [facility], [address] [place], [place] [address]  I-STAT 7, (LYTES, BLD GAS, ICA, H+H)     Status: Abnormal   Collection Time: 11/30/18  9:31 PM  Result Value Ref Range   pH, Arterial 7.360 7.350 - 7.450   pCO2 arterial 32.6 32.0 - 48.0 mmHg   pO2, Arterial 85.0 83.0 - 108.0 mmHg   Bicarbonate 18.4 (L) 20.0 - 28.0 mmol/L   TCO2 19 (L) 22 - 32 mmol/L   O2 Saturation 96.0 %   Acid-base deficit 6.0 (H) 0.0 - 2.0 mmol/L   Sodium 135 135 - 145 mmol/L   Potassium 4.5 3.5 - 5.1 mmol/L   Calcium, Ion 1.19 1.15 - 1.40 mmol/L   HCT 36.0 36.0 - 46.0 %   Hemoglobin 12.2 12.0 - 15.0 g/dL   Patient temperature 69.698.3 F    Sample type ARTERIAL   Lactic acid, plasma     Status: Abnormal   Collection Time: 11/30/18  9:59 PM  Result Value Ref Range   Lactic Acid, Venous 3.8 (HH) 0.5 - 1.9 mmol/L    Comment: CRITICAL RESULT CALLED TO, READ BACK BY AND VERIFIED WITH: OSORIO B,RN 11/30/18 2247 WAYK Performed at [facility],  [address] [place], [place] [address]   I-Stat Beta hCG blood, ED ([facility], [facility], [facility] only)     Status: None   Collection Time: 11/30/18 10:01 PM  Result Value Ref Range   I-stat hCG, quantitative <5.0 <5 mIU/mL   Comment 3            Comment:   GEST. AGE      CONC.  (mIU/mL)   <=1 WEEK        5 -  50     2 WEEKS       50 - 500     3 WEEKS       100 - 10,000     4 WEEKS     1,000 - 30,000        FEMALE AND NON-PREGNANT FEMALE:     LESS THAN 5 mIU/mL   CBG monitoring, ED     Status: Abnormal   Collection Time: 11/30/18 11:19 PM  Result Value Ref Range   Glucose-Capillary 333 (H) 70 - 99 mg/dL    Chemistries  Recent Labs  Lab 11/27/18 2135  11/27/18 2330  11/28/18 0600 11/28/18 0823 11/29/18 0234 11/30/18 2022 11/30/18 2131  NA  --    < > 135   < > 134* 134* 133* 134* 135  K  --    < > 3.8   < > 4.1 4.1 3.9 4.6 4.5  CL  --    < > 101  --  103 104 102 106  --   CO2  --    < > 21*  --  23 23 24  17*  --   GLUCOSE  --    < > 390*  --  332* 473* 360* 464*  --   BUN  --    < > 8  --  7 7 13  28*  --   CREATININE  --    < > 0.78  --  0.56 0.64 0.79 1.46*  --   CALCIUM  --    < > 9.2  --  8.0* 8.1* 8.5* 9.2  --   AST 21  --   --   --   --   --  12* 15  --   ALT 13  --   --   --   --   --  13 15  --   ALKPHOS 95  --   --   --   --   --  70 81  --   BILITOT 0.3  --   --   --   --   --  0.1* 0.4  --    < > = values in this interval not displayed.   ------------------------------------------------------------------------------------------------------------------  ------------------------------------------------------------------------------------------------------------------ GFR: Estimated Creatinine Clearance: 65.4 mL/min (A) (by C-G formula based on SCr of 1.46 mg/dL (H)). Liver Function Tests: Recent Labs  Lab 11/27/18 2135 11/29/18 0234 11/30/18 2022  AST 21 12* 15  ALT 13 13 15   ALKPHOS 95 70 81  BILITOT 0.3 0.1* 0.4  PROT 6.3* 5.6* 6.3*  ALBUMIN 3.6 3.1* 3.8   Recent Labs  Lab 11/27/18 2057  LIPASE 26   No results for input(s): AMMONIA in the last 168 hours. Coagulation Profile: No results for input(s): INR, PROTIME in the last 168 hours. Cardiac Enzymes: No results for input(s): CKTOTAL, CKMB, CKMBINDEX, TROPONINI in the last 168 hours. BNP (last 3  results) No results for input(s): PROBNP in the last 8760 hours. HbA1C: No  results for input(s): HGBA1C in the last 72 hours. CBG: Recent Labs  Lab 11/29/18 2101 11/30/18 0850 11/30/18 1142 11/30/18 2015 11/30/18 2319  GLUCAP 223* 283* 152* 421* 333*   Lipid Profile: No results for input(s): CHOL, HDL, LDLCALC, TRIG, CHOLHDL, LDLDIRECT in the last 72 hours. Thyroid Function Tests: No results for input(s): TSH, T4TOTAL, FREET4, T3FREE, THYROIDAB in the last 72 hours. Anemia Panel: No results for input(s): VITAMINB12, FOLATE, FERRITIN, TIBC, IRON, RETICCTPCT in the last 72 hours.  --------------------------------------------------------------------------------------------------------------- Urine analysis:    Component Value Date/Time   COLORURINE STRAW (A) 11/27/2018 2105   APPEARANCEUR HAZY (A) 11/27/2018 2105   LABSPEC 1.030 11/27/2018 2105   PHURINE 6.0 11/27/2018 2105   GLUCOSEU >=500 (A) 11/27/2018 2105   HGBUR NEGATIVE 11/27/2018 2105   BILIRUBINUR NEGATIVE 11/27/2018 2105   BILIRUBINUR negative 07/13/2018 1651   KETONESUR NEGATIVE 11/27/2018 2105   PROTEINUR NEGATIVE 11/27/2018 2105   UROBILINOGEN 0.2 07/13/2018 1651   UROBILINOGEN 1.0 07/09/2017 1324   NITRITE NEGATIVE 11/27/2018 2105   LEUKOCYTESUR NEGATIVE 11/27/2018 2105      Imaging Results:    Ct Head Wo Contrast  Result Date: 11/30/2018 CLINICAL DATA:  Syncope, altered LOC EXAM: CT HEAD WITHOUT CONTRAST TECHNIQUE: Contiguous axial images were obtained from the base of the skull through the vertex without intravenous contrast. COMPARISON:  CT brain 10/30/2018 FINDINGS: Brain: No evidence of acute infarction, hemorrhage, hydrocephalus, extra-axial collection or mass lesion/mass effect. Vascular: No hyperdense vessel or unexpected calcification. Skull: Normal. Negative for fracture or focal lesion. Sinuses/Orbits: Mild mucosal thickening in the sinuses. Other: None IMPRESSION: Negative non contrasted CT  appearance of the brain Electronically Signed   By: Jasmine Pang M.D.   On: 11/30/2018 22:51       Assessment & Plan:    Principal Problem:   Syncope Active Problems:   Hypertension  Syncope, Tele Trop I q2h x2 D dimer, if positive consider CTA chest Carotid ultrasound Cardiac echo   Seizure / Pseudoseizure Check MRI brain Check EEG Keppra  iv x1 Cont Topiramate for now Please consult neurology in [place]  Dm2 w hyperglycemia Glucose stabilizer  Depression/ Anxiety Cont Remeron 7.5mg  po qhs Cont Seroquel Cont Inderal Cont Lexapro  po qday Cont Buspiraone  po tid Psychiatry consult placed in computer  Gerd Cont PPI  Nausea/ vomitting Cont Reglan  Constipation DC Amitiza -> associated with syncope miralax 17g po qday prn   Lactic acidosis Unclear etiology  Low bicarb ? Topiramate , consider alternative agent, NOTE:  Decreased serum bicarbonate (adolescents and adults: 14% to 77%; children and adolescents: 9% to 25%; >5 mEq/L to <17 mEq/L: 1% to 11%)    DVT Prophylaxis-   Lovenox - SCDs  AM Labs Ordered, also please review Full Orders  Family Communication: Admission, patients condition and plan of care including tests being ordered have been discussed with the patient  who indicate understanding and agree with the plan and Code Status.  Code Status: FULL CODE per patient, spoke with mother to notify her of admission to [facility]  Admission status: Observation: Based on patients clinical presentation and evaluation of above clinical data, I have made determination that patient meets observation criteria at this time    Time spent in minutes : 70 minutes   Pearson Grippe M.D on 11/30/2018 at 11:58 PM

## 2018-12-01 ENCOUNTER — Encounter (HOSPITAL_COMMUNITY): Payer: Self-pay | Admitting: *Deleted

## 2018-12-01 ENCOUNTER — Observation Stay (HOSPITAL_BASED_OUTPATIENT_CLINIC_OR_DEPARTMENT_OTHER): Payer: Medicaid Other

## 2018-12-01 ENCOUNTER — Observation Stay (HOSPITAL_COMMUNITY): Payer: Medicaid Other

## 2018-12-01 DIAGNOSIS — I10 Essential (primary) hypertension: Secondary | ICD-10-CM | POA: Diagnosis present

## 2018-12-01 DIAGNOSIS — F339 Major depressive disorder, recurrent, unspecified: Secondary | ICD-10-CM | POA: Diagnosis present

## 2018-12-01 DIAGNOSIS — R569 Unspecified convulsions: Secondary | ICD-10-CM | POA: Insufficient documentation

## 2018-12-01 DIAGNOSIS — E669 Obesity, unspecified: Secondary | ICD-10-CM | POA: Diagnosis present

## 2018-12-01 DIAGNOSIS — R55 Syncope and collapse: Secondary | ICD-10-CM | POA: Diagnosis present

## 2018-12-01 DIAGNOSIS — K5900 Constipation, unspecified: Secondary | ICD-10-CM | POA: Diagnosis present

## 2018-12-01 DIAGNOSIS — F4329 Adjustment disorder with other symptoms: Secondary | ICD-10-CM | POA: Diagnosis not present

## 2018-12-01 DIAGNOSIS — Z8249 Family history of ischemic heart disease and other diseases of the circulatory system: Secondary | ICD-10-CM | POA: Diagnosis not present

## 2018-12-01 DIAGNOSIS — R112 Nausea with vomiting, unspecified: Secondary | ICD-10-CM | POA: Diagnosis present

## 2018-12-01 DIAGNOSIS — F445 Conversion disorder with seizures or convulsions: Secondary | ICD-10-CM | POA: Insufficient documentation

## 2018-12-01 DIAGNOSIS — Z6379 Other stressful life events affecting family and household: Secondary | ICD-10-CM | POA: Diagnosis not present

## 2018-12-01 DIAGNOSIS — Z833 Family history of diabetes mellitus: Secondary | ICD-10-CM | POA: Diagnosis not present

## 2018-12-01 DIAGNOSIS — N179 Acute kidney failure, unspecified: Secondary | ICD-10-CM

## 2018-12-01 DIAGNOSIS — Z8349 Family history of other endocrine, nutritional and metabolic diseases: Secondary | ICD-10-CM | POA: Diagnosis not present

## 2018-12-01 DIAGNOSIS — Z8719 Personal history of other diseases of the digestive system: Secondary | ICD-10-CM | POA: Diagnosis not present

## 2018-12-01 DIAGNOSIS — Z6835 Body mass index (BMI) 35.0-35.9, adult: Secondary | ICD-10-CM | POA: Diagnosis not present

## 2018-12-01 DIAGNOSIS — E118 Type 2 diabetes mellitus with unspecified complications: Secondary | ICD-10-CM

## 2018-12-01 DIAGNOSIS — K219 Gastro-esophageal reflux disease without esophagitis: Secondary | ICD-10-CM | POA: Diagnosis present

## 2018-12-01 DIAGNOSIS — E872 Acidosis: Secondary | ICD-10-CM | POA: Diagnosis present

## 2018-12-01 DIAGNOSIS — F4310 Post-traumatic stress disorder, unspecified: Secondary | ICD-10-CM | POA: Diagnosis present

## 2018-12-01 DIAGNOSIS — E1165 Type 2 diabetes mellitus with hyperglycemia: Secondary | ICD-10-CM | POA: Diagnosis present

## 2018-12-01 DIAGNOSIS — F4320 Adjustment disorder, unspecified: Secondary | ICD-10-CM | POA: Diagnosis present

## 2018-12-01 DIAGNOSIS — F419 Anxiety disorder, unspecified: Secondary | ICD-10-CM | POA: Diagnosis present

## 2018-12-01 DIAGNOSIS — Z20828 Contact with and (suspected) exposure to other viral communicable diseases: Secondary | ICD-10-CM | POA: Diagnosis present

## 2018-12-01 DIAGNOSIS — Z915 Personal history of self-harm: Secondary | ICD-10-CM | POA: Diagnosis not present

## 2018-12-01 DIAGNOSIS — G8929 Other chronic pain: Secondary | ICD-10-CM | POA: Diagnosis present

## 2018-12-01 DIAGNOSIS — I493 Ventricular premature depolarization: Secondary | ICD-10-CM | POA: Diagnosis present

## 2018-12-01 DIAGNOSIS — Z9089 Acquired absence of other organs: Secondary | ICD-10-CM | POA: Diagnosis not present

## 2018-12-01 LAB — URINALYSIS, ROUTINE W REFLEX MICROSCOPIC
Bacteria, UA: NONE SEEN
Bilirubin Urine: NEGATIVE
Glucose, UA: >=500 mg/dL — AB
Hgb urine dipstick: NEGATIVE
Ketones, ur: 5 mg/dL — AB
Leukocytes,Ua: NEGATIVE
Nitrite: NEGATIVE
Protein, ur: NEGATIVE mg/dL
Specific Gravity, Urine: 1.017 (ref 1.005–1.030)
pH: 7 (ref 5.0–8.0)

## 2018-12-01 LAB — GLUCOSE, CAPILLARY
Glucose-Capillary: 236 mg/dL — ABNORMAL HIGH (ref 70–99)
Glucose-Capillary: 182 mg/dL — ABNORMAL HIGH (ref 70–99)
Glucose-Capillary: 191 mg/dL — ABNORMAL HIGH (ref 70–99)
Glucose-Capillary: 257 mg/dL — ABNORMAL HIGH (ref 70–99)
Glucose-Capillary: 189 mg/dL — ABNORMAL HIGH (ref 70–99)

## 2018-12-01 LAB — RAPID URINE DRUG SCREEN, HOSP PERFORMED
Amphetamines: NOT DETECTED
Barbiturates: NOT DETECTED
Benzodiazepines: NOT DETECTED
Cocaine: NOT DETECTED
Opiates: NOT DETECTED
Tetrahydrocannabinol: NOT DETECTED

## 2018-12-01 LAB — TROPONIN I (HIGH SENSITIVITY)
Troponin I (High Sensitivity): <2 ng/L (ref <18)
Troponin I (High Sensitivity): <2 ng/L (ref <18)

## 2018-12-01 LAB — POCT I-STAT 7, (LYTES, BLD GAS, ICA,H+H)
Acid-base deficit: 3 mmol/L — ABNORMAL HIGH (ref 0.0–2.0)
Bicarbonate: 21.3 mmol/L (ref 20.0–28.0)
Calcium, Ion: 1.18 mmol/L (ref 1.15–1.40)
HCT: 36 % (ref 36.0–46.0)
Hemoglobin: 12.2 g/dL (ref 12.0–15.0)
O2 Saturation: 96 %
Potassium: 3.9 mmol/L (ref 3.5–5.1)
Sodium: 140 mmol/L (ref 135–145)
TCO2: 22 mmol/L (ref 22–32)
pCO2 arterial: 34.2 mmHg (ref 32.0–48.0)
pH, Arterial: 7.402 (ref 7.350–7.450)
pO2, Arterial: 84 mmHg (ref 83.0–108.0)

## 2018-12-01 LAB — CBC
HCT: 37.4 % (ref 36.0–46.0)
Hemoglobin: 12.4 g/dL (ref 12.0–15.0)
MCH: 27.4 pg (ref 26.0–34.0)
MCHC: 33.2 g/dL (ref 30.0–36.0)
MCV: 82.6 fL (ref 80.0–100.0)
Platelets: 306 10*3/uL (ref 150–400)
RBC: 4.53 MIL/uL (ref 3.87–5.11)
RDW: 13.2 % (ref 11.5–15.5)
WBC: 12.5 10*3/uL — ABNORMAL HIGH (ref 4.0–10.5)
nRBC: 0 % (ref 0.0–0.2)

## 2018-12-01 LAB — ECHOCARDIOGRAM COMPLETE
Height: 63 in
Weight: 3167.99 oz

## 2018-12-01 LAB — LACTIC ACID, PLASMA: Lactic Acid, Venous: 2.6 mmol/L (ref 0.5–1.9)

## 2018-12-01 LAB — COMPREHENSIVE METABOLIC PANEL
ALT: 14 U/L (ref 0–44)
AST: 11 U/L — ABNORMAL LOW (ref 15–41)
Albumin: 3.5 g/dL (ref 3.5–5.0)
Alkaline Phosphatase: 70 U/L (ref 38–126)
Anion gap: 9 (ref 5–15)
BUN: 20 mg/dL (ref 6–20)
CO2: 23 mmol/L (ref 22–32)
Calcium: 9 mg/dL (ref 8.9–10.3)
Chloride: 109 mmol/L (ref 98–111)
Creatinine, Ser: 1.77 mg/dL — ABNORMAL HIGH (ref 0.44–1.00)
GFR calc Af Amer: 47 mL/min — ABNORMAL LOW (ref >60)
GFR calc non Af Amer: 41 mL/min — ABNORMAL LOW (ref >60)
Glucose, Bld: 235 mg/dL — ABNORMAL HIGH (ref 70–99)
Potassium: 3.9 mmol/L (ref 3.5–5.1)
Sodium: 141 mmol/L (ref 135–145)
Total Bilirubin: 0.6 mg/dL (ref 0.3–1.2)
Total Protein: 6.1 g/dL — ABNORMAL LOW (ref 6.5–8.1)

## 2018-12-01 LAB — CBG MONITORING, ED: Glucose-Capillary: 212 mg/dL — ABNORMAL HIGH (ref 70–99)

## 2018-12-01 LAB — SARS CORONAVIRUS 2 (TAT 6-24 HRS): SARS Coronavirus 2: NEGATIVE

## 2018-12-01 MED ORDER — INSULIN ASPART 100 UNIT/ML ~~LOC~~ SOLN: 0.0000 [IU] | Freq: Every day | SUBCUTANEOUS | Status: DC

## 2018-12-01 MED ORDER — PANTOPRAZOLE SODIUM 40 MG PO TBEC
40.0000 mg | DELAYED_RELEASE_TABLET | Freq: Two times a day (BID) | ORAL | Status: DC
  Administered 2018-12-01 – 2018-12-02 (×3): 40 mg via ORAL
  Filled 2018-12-01 (×3): qty 1

## 2018-12-01 MED ORDER — INSULIN ASPART 100 UNIT/ML ~~LOC~~ SOLN
0.0000 [IU] | Freq: Three times a day (TID) | SUBCUTANEOUS | Status: DC
  Administered 2018-12-01: 4 [IU] via SUBCUTANEOUS
  Administered 2018-12-01: 11 [IU] via SUBCUTANEOUS
  Administered 2018-12-02: 07:00:00 20 [IU] via SUBCUTANEOUS
  Administered 2018-12-02: 12:00:00 4 [IU] via SUBCUTANEOUS

## 2018-12-01 MED ORDER — FLUOXETINE HCL 20 MG PO CAPS
40.0000 mg | ORAL_CAPSULE | Freq: Every day | ORAL | Status: DC
  Administered 2018-12-01 – 2018-12-02 (×2): 40 mg via ORAL
  Filled 2018-12-01 (×2): qty 2

## 2018-12-01 MED ORDER — SODIUM CHLORIDE 0.9 % IV SOLN
INTRAVENOUS | Status: AC
  Administered 2018-12-01 (×2): via INTRAVENOUS

## 2018-12-01 MED ORDER — INSULIN GLARGINE 100 UNIT/ML ~~LOC~~ SOLN
60.0000 [IU] | Freq: Every day | SUBCUTANEOUS | Status: DC
  Administered 2018-12-01 – 2018-12-02 (×2): 60 [IU] via SUBCUTANEOUS
  Filled 2018-12-01 (×2): qty 0.6

## 2018-12-01 MED ORDER — PROPRANOLOL HCL 20 MG PO TABS
20.0000 mg | ORAL_TABLET | Freq: Three times a day (TID) | ORAL | Status: DC
  Administered 2018-12-01 – 2018-12-02 (×4): 20 mg via ORAL
  Filled 2018-12-01 (×6): qty 1

## 2018-12-01 MED ORDER — HYDROXYZINE HCL 25 MG PO TABS
50.0000 mg | ORAL_TABLET | Freq: Three times a day (TID) | ORAL | Status: DC
  Administered 2018-12-01 – 2018-12-02 (×4): 50 mg via ORAL
  Filled 2018-12-01 (×4): qty 2

## 2018-12-01 MED ORDER — CYCLOSPORINE 0.05 % OP EMUL
1.0000 [drp] | Freq: Two times a day (BID) | OPHTHALMIC | Status: DC
  Administered 2018-12-01 – 2018-12-02 (×3): 1 [drp] via OPHTHALMIC
  Filled 2018-12-01 (×4): qty 30

## 2018-12-01 MED ORDER — ATORVASTATIN CALCIUM 10 MG PO TABS
20.0000 mg | ORAL_TABLET | Freq: Every day | ORAL | Status: DC
  Administered 2018-12-01: 20 mg via ORAL
  Filled 2018-12-01: qty 2

## 2018-12-01 MED ORDER — METOCLOPRAMIDE HCL 10 MG PO TABS
10.0000 mg | ORAL_TABLET | Freq: Three times a day (TID) | ORAL | Status: DC
  Administered 2018-12-01 – 2018-12-02 (×5): 10 mg via ORAL
  Filled 2018-12-01 (×5): qty 1

## 2018-12-01 MED ORDER — QUETIAPINE FUMARATE 25 MG PO TABS
25.0000 mg | ORAL_TABLET | Freq: Two times a day (BID) | ORAL | Status: DC
  Administered 2018-12-01 – 2018-12-02 (×2): 25 mg via ORAL
  Filled 2018-12-01 (×2): qty 1

## 2018-12-01 MED ORDER — SODIUM CHLORIDE 0.9 % IV SOLN
INTRAVENOUS | Status: AC
  Administered 2018-12-01: 12:00:00 via INTRAVENOUS

## 2018-12-01 MED ORDER — LUBIPROSTONE 24 MCG PO CAPS
24.0000 ug | ORAL_CAPSULE | Freq: Two times a day (BID) | ORAL | Status: DC
  Administered 2018-12-01 – 2018-12-02 (×2): 24 ug via ORAL
  Filled 2018-12-01 (×4): qty 1

## 2018-12-01 MED ORDER — ADULT MULTIVITAMIN W/MINERALS CH
1.0000 | ORAL_TABLET | Freq: Every day | ORAL | Status: DC
  Administered 2018-12-01 – 2018-12-02 (×2): 1 via ORAL
  Filled 2018-12-01 (×2): qty 1

## 2018-12-01 MED ORDER — BUSPIRONE HCL 10 MG PO TABS
10.0000 mg | ORAL_TABLET | Freq: Three times a day (TID) | ORAL | Status: DC
  Administered 2018-12-01 – 2018-12-02 (×4): 10 mg via ORAL
  Filled 2018-12-01 (×4): qty 1

## 2018-12-01 MED ORDER — MIRTAZAPINE 15 MG PO TABS
7.5000 mg | ORAL_TABLET | Freq: Every day | ORAL | Status: DC
  Administered 2018-12-01: 7.5 mg via ORAL
  Filled 2018-12-01: qty 1

## 2018-12-01 MED ORDER — DICYCLOMINE HCL 10 MG PO CAPS
10.0000 mg | ORAL_CAPSULE | Freq: Three times a day (TID) | ORAL | Status: DC | PRN
  Administered 2018-12-01: 23:00:00 10 mg via ORAL
  Filled 2018-12-01 (×2): qty 1

## 2018-12-01 MED ORDER — OLOPATADINE HCL 0.1 % OP SOLN
3.0000 [drp] | Freq: Two times a day (BID) | OPHTHALMIC | Status: DC
  Administered 2018-12-01 – 2018-12-02 (×3): 3 [drp] via OPHTHALMIC
  Filled 2018-12-01: qty 5

## 2018-12-01 MED ORDER — INSULIN ASPART 100 UNIT/ML ~~LOC~~ SOLN: 0.0000 [IU] | SUBCUTANEOUS | Status: DC

## 2018-12-01 MED ORDER — ESCITALOPRAM OXALATE 10 MG PO TABS
20.0000 mg | ORAL_TABLET | Freq: Every day | ORAL | Status: DC
  Administered 2018-12-01 – 2018-12-02 (×2): 20 mg via ORAL
  Filled 2018-12-01 (×2): qty 2

## 2018-12-01 MED ORDER — INSULIN ASPART 100 UNIT/ML ~~LOC~~ SOLN
0.0000 [IU] | SUBCUTANEOUS | Status: DC
  Administered 2018-12-01 (×2): 3 [IU] via SUBCUTANEOUS
  Administered 2018-12-01: 2 [IU] via SUBCUTANEOUS

## 2018-12-01 MED ORDER — TOPIRAMATE 25 MG PO TABS
25.0000 mg | ORAL_TABLET | Freq: Two times a day (BID) | ORAL | Status: DC
  Administered 2018-12-01 – 2018-12-02 (×3): 25 mg via ORAL
  Filled 2018-12-01 (×3): qty 1

## 2018-12-01 MED ORDER — QUETIAPINE FUMARATE 400 MG PO TABS
400.0000 mg | ORAL_TABLET | Freq: Every day | ORAL | Status: DC
  Administered 2018-12-01: 400 mg via ORAL
  Filled 2018-12-01 (×2): qty 1

## 2018-12-01 MED ORDER — LEVETIRACETAM IN NACL 1000 MG/100ML IV SOLN
1000.0000 mg | Freq: Once | INTRAVENOUS | Status: AC
  Administered 2018-12-01: 1000 mg via INTRAVENOUS
  Filled 2018-12-01: qty 100

## 2018-12-01 MED ORDER — LAMOTRIGINE 100 MG PO TABS
50.0000 mg | ORAL_TABLET | Freq: Two times a day (BID) | ORAL | Status: DC
  Administered 2018-12-01 – 2018-12-02 (×3): 50 mg via ORAL
  Filled 2018-12-01 (×3): qty 1

## 2018-12-01 NOTE — Progress Notes (Signed)
  Echocardiogram 2D Echocardiogram has been performed.  Nancy [name] M 12/01/2018, 9:10 AM

## 2018-12-01 NOTE — ED Notes (Signed)
RN notified Dr. Maudie Mercury CBG 212. RN to hold IV insulin. Repeat Arterial Blood Gas and report Bicarb level.

## 2018-12-01 NOTE — Progress Notes (Signed)
Carotid duplex       has been completed. Preliminary results can be found under CV proc through chart review. Katlin Ciszewski, BS, RDMS, RVT   
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
## 2018-12-01 NOTE — Progress Notes (Signed)
HR has been sustained in the 120s, dAPP on call, no new orders

## 2018-12-01 NOTE — ED Notes (Signed)
RT requested for repeat arterial blood gas

## 2018-12-01 NOTE — Procedures (Addendum)
Patient Name: Nancy [name]  MRN: [id number]  Epilepsy Attending: Lora Havens  Referring Physician/Provider: Dr. Jani Gravel Date: 12/01/2018 Duration: 25.36 minutes  Patient history: 18 year old female with syncopal episode and possible seizure-like activity.  EEG to evaluate for seizures.  Level of alertness: Awake, asleep  AEDs during EEG study: Lamotrigine, topiramate  Technical aspects: This EEG study was done with scalp electrodes positioned according to the 10-20 International system of electrode placement. Electrical activity was acquired at a sampling rate of 500Hz  and reviewed with a high frequency filter of 70Hz  and a low frequency filter of 1Hz . EEG data were recorded continuously and digitally stored.   Description: During awake state, no clear posterior dominant rhythm was seen.  Sleep was characterized by vertex waves, sleep spindles (12 to 14 Hz), maximal frontocentral.  Physiologic photic driving was not seen during photic stimulation.  Generalized 3 to 5 Hz theta-delta slowing was seen during hyperventilation.  IMPRESSION: This study is within normal limits. No seizures or epileptiform discharges were seen throughout the recording.  Nancy [name]

## 2018-12-01 NOTE — ED Notes (Signed)
ED TO INPATIENT HANDOFF REPORT  ED Nurse Name and Phone #:  [phone]  S Name/Age/Gender Nancy [name] 18 y.o. female Room/Bed: 017C/017C  Code Status   Code Status: Full Code  Home/SNF/Other Home  Is this baseline? No   Triage Complete: Triage complete  Chief Complaint SOB,CP, N/V, Syncope  Triage Note Pt from home c/o syncopal episode while sitting on couch, no injury, no fall. Was d/c this morning. Pt then began experiencing CP, N, V, SOB, abdominal pain, and dizziness.   EMS gave 4 mg zofran, 324 ASA< 1 NTG  Pt Alert upon arrival, able to answer questions.  Activitly seizing started at 2006. MD at bedside  HX seizure  and/or pseudoseizures    Allergies Allergies  Allergen Reactions  . Ibuprofen Other (See Comments)    GI MD said to not take this anymore  . Oatmeal Rash    Level of Care/Admitting Diagnosis ED Disposition    ED Disposition Condition Comment   Admit  Hospital Area: [facility] [[id number]]  Level of Care: Progressive [102]  Admit to Progressive based on following criteria: CARDIOVASCULAR & THORACIC of moderate stability with acute coronary syndrome symptoms/low risk myocardial infarction/hypertensive urgency/arrhythmias/heart failure potentially compromising stability and stable post cardiovascular intervention patients.  Covid Evaluation: Asymptomatic Screening Protocol (No Symptoms)  Diagnosis: Syncope [206001]  Admitting Physician: Pearson Grippe [[id number]]  Attending Physician: Pearson Grippe [[id number]]  PT Class (Do Not Modify): Observation [104]  PT Acc Code (Do Not Modify): Observation [10022]       B Medical/Surgery History Past Medical History:  Diagnosis Date  . Acanthosis nigricans   . Anxiety   . CHF (congestive heart failure) (HCC)   . Chronic lower back pain   . Depression   . DKA, type 1 (HCC) 09/13/2018  . Dyspepsia   . Obesity   . Ovarian cyst    pt is not aware of this hx (11/24/2017)  . Pre-diabetes   .  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 Premature baby   . Seizures (HCC)   . Type II diabetes mellitus (HCC)    insulin dependant   Past Surgical History:  Procedure Laterality Date  . ABDOMINAL HERNIA REPAIR     "I was a baby"  . BIOPSY  10/12/2018   Procedure: BIOPSY;  Surgeon: Lynann Bologna, MD;  [facility] END[facility]COPY;  Service: Endoscopy;;  . ESOPHAGOGASTRODUODENOSCOPY (EGD) WITH PROPOFOL N/A 10/12/2018   Procedure: ESOPHAGOGASTRODUODENOSCOPY (EGD) WITH PROPOFOL;  Surgeon: Lynann Bologna, MD;  Location: [facility] ENDOSCOPY;  Service: Endoscopy;  Laterality: N/A;  . HERNIA REPAIR    . LEFT HEART CATH AND CORONARY ANGIOGRAPHY N/A 10/13/2018   Procedure: LEFT HEART CATH AND CORONARY ANGIOGRAPHY;  Surgeon: Kathleene Hazel, MD;  Location: [facility] INVASIVE CV LAB;  Service: Cardiovascular;  Laterality: N/A;  . TONSILLECTOMY AND ADENOIDECTOMY    . WISDOM TOOTH EXTRACTION  2017     A IV Location/Drains/Wounds Patient Lines/Drains/Airways Status   Active Line/Drains/Airways    Name:   Placement date:   Placement time:   Site:   Days:   Peripheral IV 11/30/18 Right Hand   11/30/18    2018    Hand   1          Intake/Output Last 24 hours No intake or output data in the 24 hours ending 12/01/18 0119  Labs/Imaging Results for orders placed or performed during the hospital encounter of 11/30/18 (from the past 48 hour(s))  CBG monitoring, ED  Status: Abnormal   Collection Time: 11/30/18  8:15 PM  Result Value Ref Range   Glucose-Capillary 421 (H) 70 - 99 mg/dL  CBC with Differential     Status: Abnormal   Collection Time: 11/30/18  8:22 PM  Result Value Ref Range   WBC 12.2 (H) 4.0 - 10.5 K/uL   RBC 4.62 3.87 - 5.11 MIL/uL   Hemoglobin 12.6 12.0 - 15.0 g/dL   HCT 49.6 75.9 - 16.3 %   MCV 83.5 80.0 - 100.0 fL   MCH 27.3 26.0 - 34.0 pg   MCHC 32.6 30.0 - 36.0 g/dL   RDW 84.6 65.9 - 93.5 %   Platelets 297 150 - 400 K/uL   nRBC 0.0 0.0 - 0.2 %   Neutrophils Relative % 55 %   Neutro Abs  6.9 1.7 - 7.7 K/uL   Lymphocytes Relative 32 %   Lymphs Abs 3.9 0.7 - 4.0 K/uL   Monocytes Relative 9 %   Monocytes Absolute 1.1 (H) 0.1 - 1.0 K/uL   Eosinophils Relative 2 %   Eosinophils Absolute 0.2 0.0 - 0.5 K/uL   Basophils Relative 1 %   Basophils Absolute 0.1 0.0 - 0.1 K/uL   Immature Granulocytes 1 %   Abs Immature Granulocytes 0.08 (H) 0.00 - 0.07 K/uL    Comment: Performed at [facility], [address], [place], [place] [address]  Comprehensive metabolic panel     Status: Abnormal   Collection Time: 11/30/18  8:22 PM  Result Value Ref Range   Sodium 134 (L) 135 - 145 mmol/L   Potassium 4.6 3.5 - 5.1 mmol/L   Chloride 106 98 - 111 mmol/L   CO2 17 (L) 22 - 32 mmol/L   Glucose, Bld 464 (H) 70 - 99 mg/dL   BUN 28 (H) 6 - 20 mg/dL   Creatinine, Ser 9.39 (H) 0.44 - 1.00 mg/dL   Calcium 9.2 8.9 - 03.0 mg/dL   Total Protein 6.3 (L) 6.5 - 8.1 g/dL   Albumin 3.8 3.5 - 5.0 g/dL   AST 15 15 - 41 U/L   ALT 15 0 - 44 U/L   Alkaline Phosphatase 81 38 - 126 U/L   Total Bilirubin 0.4 0.3 - 1.2 mg/dL   GFR calc non Af Amer 51 (L) >60 mL/min   GFR calc Af Amer 59 (L) >60 mL/min   Anion gap 11 5 - 15    Comment: Performed at [facility], [address] [place], [place] [address]  I-STAT 7, (LYTES, BLD GAS, ICA, H+H)     Status: Abnormal   Collection Time: 11/30/18  9:31 PM  Result Value Ref Range   pH, Arterial 7.360 7.350 - 7.450   pCO2 arterial 32.6 32.0 - 48.0 mmHg   pO2, Arterial 85.0 83.0 - 108.0 mmHg   Bicarbonate 18.4 (L) 20.0 - 28.0 mmol/L   TCO2 19 (L) 22 - 32 mmol/L   O2 Saturation 96.0 %   Acid-base deficit 6.0 (H) 0.0 - 2.0 mmol/L   Sodium 135 135 - 145 mmol/L   Potassium 4.5 3.5 - 5.1 mmol/L   Calcium, Ion 1.19 1.15 - 1.40 mmol/L   HCT 36.0 36.0 - 46.0 %   Hemoglobin 12.2 12.0 - 15.0 g/dL   Patient temperature 00.7 F    Sample type ARTERIAL   Lactic acid, plasma     Status: Abnormal   Collection Time: 11/30/18  9:59 PM  Result Value Ref Range    Lactic  Acid, Venous 3.8 (HH) 0.5 - 1.9 mmol/L    Comment: CRITICAL RESULT CALLED TO, READ BACK BY AND VERIFIED WITH: OSORIO B,RN 11/30/18 2247 WAYK Performed at [facility], [address] [place], [place] [address]   I-Stat Beta hCG blood, ED ([facility], [facility], [facility] only)     Status: None   Collection Time: 11/30/18 10:01 PM  Result Value Ref Range   I-stat hCG, quantitative <5.0 <5 mIU/mL   Comment 3            Comment:   GEST. AGE      CONC.  (mIU/mL)   <=1 WEEK        5 - 50     2 WEEKS       50 - 500     3 WEEKS       100 - 10,000     4 WEEKS     1,000 - 30,000        FEMALE AND NON-PREGNANT FEMALE:     LESS THAN 5 mIU/mL   CBG monitoring, ED     Status: Abnormal   Collection Time: 11/30/18 11:19 PM  Result Value Ref Range   Glucose-Capillary 333 (H) 70 - 99 mg/dL  CBG monitoring, ED     Status: Abnormal   Collection Time: 12/01/18 12:30 AM  Result Value Ref Range   Glucose-Capillary 212 (H) 70 - 99 mg/dL   Ct Head Wo Contrast  Result Date: 11/30/2018 CLINICAL DATA:  Syncope, altered LOC EXAM: CT HEAD WITHOUT CONTRAST TECHNIQUE: Contiguous axial images were obtained from the base of the skull through the vertex without intravenous contrast. COMPARISON:  CT brain 10/30/2018 FINDINGS: Brain: No evidence of acute infarction, hemorrhage, hydrocephalus, extra-axial collection or mass lesion/mass effect. Vascular: No hyperdense vessel or unexpected calcification. Skull: Normal. Negative for fracture or focal lesion. Sinuses/Orbits: Mild mucosal thickening in the sinuses. Other: None IMPRESSION: Negative non contrasted CT appearance of the brain Electronically Signed   By: Jasmine PangKim  Fujinaga M.D.   On: 11/30/2018 22:51    Pending Labs Unresulted Labs (From admission, onward)    Start     Ordered   12/07/18 0500  Creatinine, serum  (enoxaparin (LOVENOX)    CrCl >/= 30 ml/min)  Weekly,   R    Comments: while on enoxaparin therapy    11/30/18 2356   12/01/18 0500  Basic metabolic panel   (Hyperglycemia (not DKA or HHS))  Daily,   STAT     11/30/18 2356   12/01/18 0500  Comprehensive metabolic panel  Tomorrow morning,   R     11/30/18 2356   12/01/18 0500  CBC  Tomorrow morning,   R     11/30/18 2356   12/01/18 0047  Blood gas, arterial ([facility])  ONCE - STAT,   STAT     12/01/18 0046   11/30/18 2355  SARS CORONAVIRUS 2 (TAT 6-24 HRS) Nasopharyngeal Nasopharyngeal Swab  (Asymptomatic/Tier 3)  Once,   STAT    Question Answer Comment  Is this test for diagnosis or screening Screening   Symptomatic for COVID-19 as defined by CDC No   Hospitalized for COVID-19 No   Admitted to ICU for COVID-19 No   Previously tested for COVID-19 Yes   Resident in a congregate (group) care setting No   Employed in healthcare setting No   Pregnant No      11/30/18 2354   11/30/18 2347  Urine rapid drug screen (hosp performed)  ONCE - STAT,   STAT     11/30/18 2346   11/30/18 2143  Lactic acid, plasma  Now then every 2 hours,   STAT     11/30/18 2143   11/30/18 2022  Urinalysis, Routine w reflex microscopic  Once,   STAT     11/30/18 2021          Vitals/Pain Today's Vitals   11/30/18 2248 11/30/18 2315 11/30/18 2345 12/01/18 0104  BP: 107/68 (!) 106/54 118/64 118/62  Pulse: (!) 123 (!) 121 (!) 128 (!) 129  Resp: (!) 35 (!) 32 (!) 31 (!) 29  Temp:      TempSrc:      SpO2: 100% 96% 97% 97%  Weight:      Height:      PainSc:        Isolation Precautions No active isolations  Medications Medications  insulin regular, human (MYXREDLIN) 100 units/ 100 mL infusion (0 Units/hr Intravenous Hold 12/01/18 0017)  0.9 %  sodium chloride infusion ( Intravenous New Bag/Given 12/01/18 0104)  dextrose 5 %-0.45 % sodium chloride infusion ( Intravenous Hold 12/01/18 0017)  dextrose 50 % solution 0-50 mL (has no administration in time range)  enoxaparin (LOVENOX) injection 40 mg (has no administration in time range)  acetaminophen (TYLENOL) tablet 650 mg (has no administration in time  range)    Or  acetaminophen (TYLENOL) suppository 650 mg (has no administration in time range)  lactated ringers bolus 1,000 mL (0 mLs Intravenous Stopped 12/01/18 0017)  lactated ringers bolus 1,000 mL (0 mLs Intravenous Stopped 12/01/18 0017)  insulin aspart (novoLOG) injection 10 Units (10 Units Intravenous Given 11/30/18 2320)    Mobility non-ambulatory Low fall risk   Focused Assessments Neuro Assessment Handoff:  Cardiac Rhythm: Sinus tachycardia       Neuro Assessment: Exceptions to WDL Neuro Checks:      Last Documented NIHSS Modified Score:   Has TPA been given? No If patient is a Neuro Trauma and patient is going to OR before floor call report to [place] nurse: [phone] or [phone]     R Recommendations: See Admitting Provider Note  Report given to:   Additional Notes:

## 2018-12-01 NOTE — Progress Notes (Signed)
Progress Note    CLARITZA JULY  ZOX:[id number] DOB: September 11, 2000  DOA: 11/30/2018 PCP: Claiborne Rigg, NP    Brief Narrative:     Medical records reviewed and are as summarized below:  [name] is an 18 y.o. female w diabetes type 1, , CHF (EF 60-65%), w normal cardiac cath 10/13/2018, h/o esophagitis/ candidiasis, seizure/ and/or pseudoseizures, depression with anxiety w recent admission for Hyperglycemia, apparently presents tonight due to syncope, and in ER had witnessed seizure per nursing staff? She has had 26 ER/hospitalizations in last 6 months.    Assessment/Plan:   Principal Problem:   Syncope Active Problems:   Hypertension    Reported Syncope/seizure (h/o pseudoseizures) -negative troponin -Carotid ultrasound by admitting MD -Cardiac echo ordered by admitting MD -MRI: No acute intracranial abnormality - Keppra  IV given in ER -EEG pending -on Neurontin/topamax and lamictal -chart review shows:  LTM EEG preliminary report, 8/1:  EEG reviewed till 7am.Two events were captured on 08/06/2018 at 1822 and 2009. During both events, patient started having tremor like movements in her right leg which gradually progressed to whole body tremoring. Eyes were open during first event ( unable to clearly see during second) and she responded to RN shaking her head sideways when asked if she can stop the movement. It abruptly stopped after about 1 minute. Concomitant EEG did not show seizure before, during and after the event. 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 Lesslikely epilepsy given herprior unremarkableEEG's and examfindingstodaywithout postictal state following spells. Per mother, the patient has demonstrated notable cognitive slowing since startingKeppra. Restarting her on this medication is not currently indicated given the pseudoseizures seen on overnight EEG. 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   Dm2 w hyperglycemia NOT on a glucose stabilizer -SSI q 4 -lantus -appreciated diabetic coordination consult  AKI -? Due to increased blood sugars -IVF -bladder scan PRN -recheck in AM  Depression/ Anxiety Cont Remeron 7.5mg  po qhs Cont Seroquel Cont Inderal Cont Lexapro  po qday Cont Buspiraone  po tid Psychiatry consult placed in computer by admitting MD  Genella Rife Cont PPI  Nausea/ vomitting Cont Reglan  Constipation DC Amitiza -> associated with syncope miralax 17g po qday prn   Lactic acidosis Unclear etiology  obesity Body mass index is 35.07 kg/m.   Family Communication/Anticipated D/C date and plan/Code Status   DVT prophylaxis: Lovenox ordered. Code Status: Full Code.  Family Communication:  Disposition Plan: pending work up   Scientist, physiological:    None.     Subjective:   Per nursing, minimally responsive.  Did not respond to vigorous sternal rub, did not withdraw to painful stimuli on lower extremitited but did wake up when I checked her pupils with a light.  Speech was clear and patient answered questions   Objective:    Vitals:   12/01/18 0104 12/01/18 0218 12/01/18 0347 12/01/18 0822  BP: 118/62 (!) 101/59 109/64 (!) 101/55  Pulse: (!) 129 (!) 121 (!) 125 (!) 126  Resp: (!) (!) 24  Temp:  98.1 F (36.7 C) 97.7 F (36.5 C) 98.4 F (36.9 C)  TempSrc:  Oral Oral Axillary  SpO2: 97% 100% 100% 100%  Weight:      Height:        Intake/Output Summary (Last 24 hours) at 12/01/2018 1040 Last data filed at 12/01/2018  0600 Gross per 24 hour  Intake 208.54 ml  Output 800 ml  Net -591.46 ml   Filed Weights   11/30/18 2015  Weight: 89.8 kg    Exam: In bed, unresponsive.  No reaction to sternal rub, no movement to painful stimuli in lower ext, open eyes with light when I  was examining her pupils and she immediately woke up following commands and answering questions Alert rrr +BS, soft, NT No LE edema  Data Reviewed:   I have personally reviewed following labs and imaging studies:  Labs: Labs show the following:   Basic Metabolic Panel: Recent Labs  Lab 11/28/18 0600 11/28/18 0823 11/29/18 0234 11/30/18 2022 11/30/18 2131 12/01/18 0120 12/01/18 0242  NA 134* 134* 133* 134* 135 140 141  K 4.1 4.1 3.9 4.6 4.5 3.9 3.9  CL 103 104 102 106  --   --  109  CO2 23 23 24  17*  --   --  23  GLUCOSE 332* 473* 360* 464*  --   --  235*  BUN 7 7 13  28*  --   --  20  CREATININE 0.56 0.64 0.79 1.46*  --   --  1.77*  CALCIUM 8.0* 8.1* 8.5* 9.2  --   --  9.0   GFR Estimated Creatinine Clearance: 53.9 mL/min (A) (by C-G formula based on SCr of 1.77 mg/dL (H)). Liver Function Tests: Recent Labs  Lab 11/27/18 2135 11/29/18 0234 11/30/18 2022 12/01/18 0242  AST 21 12* 15 11*  ALT 13 13 15 14   ALKPHOS 95 70 81 70  BILITOT 0.3 0.1* 0.4 0.6  PROT 6.3* 5.6* 6.3* 6.1*  ALBUMIN 3.6 3.1* 3.8 3.5   Recent Labs  Lab 11/27/18 2057  LIPASE 26   No results for input(s): AMMONIA in the last 168 hours. Coagulation profile No results for input(s): INR, PROTIME in the last 168 hours.  CBC: Recent Labs  Lab 11/27/18 2057  11/28/18 0600 11/30/18 2022 11/30/18 2131 12/01/18 0120 12/01/18 0242  WBC 9.3  --  8.3 12.2*  --   --  12.5*  NEUTROABS  --   --   --  6.9  --   --   --   HGB 12.1   < > 10.8* 12.6 12.2 12.2 12.4  HCT 38.4   < > 33.6* 38.6 36.0 36.0 37.4  MCV 87.5  --  84.4 83.5  --   --  82.6  PLT 283  --  251 297  --   --  306   < > = values in this interval not displayed.   Cardiac Enzymes: No results for input(s): CKTOTAL, CKMB, CKMBINDEX, TROPONINI in the last 168 hours. BNP (last 3 results) No results for input(s): PROBNP in the last 8760 hours. CBG: Recent Labs  Lab 11/30/18 2015 11/30/18 2319 12/01/18 0030 12/01/18 0305  12/01/18 0824  GLUCAP 421* 333* 212* 236* 182*   D-Dimer: No results for input(s): DDIMER in the last 72 hours. Hgb A1c: No results for input(s): HGBA1C in the last 72 hours. Lipid Profile: No results for input(s): CHOL, HDL, LDLCALC, TRIG, CHOLHDL, LDLDIRECT in the last 72 hours. Thyroid function studies: No results for input(s): TSH, T4TOTAL, T3FREE, THYROIDAB in the last 72 hours.  Invalid input(s): FREET3 Anemia work up: No results for input(s): VITAMINB12, FOLATE, FERRITIN, TIBC, IRON, RETICCTPCT in the last 72 hours. Sepsis Labs: Recent Labs  Lab 11/27/18 2057 11/28/18 0600 11/30/18 2022 11/30/18 2159 11/30/18 2343 12/01/18 0242  WBC 9.3 8.3  12.2*  --   --  12.5*  LATICACIDVEN  --   --   --  3.8* 2.6*  --     Microbiology Recent Results (from the past 240 hour(s))  SARS CORONAVIRUS 2 (TAT 6-24 HRS) Nasopharyngeal Nasopharyngeal Swab     Status: None   Collection Time: 11/28/18  1:04 AM   Specimen: Nasopharyngeal Swab  Result Value Ref Range Status   SARS Coronavirus 2 NEGATIVE NEGATIVE Final    Comment: (NOTE) SARS-CoV-2 target nucleic acids are NOT DETECTED. The SARS-CoV-2 RNA is generally detectable in upper and lower respiratory specimens during the acute phase of infection. Negative results do not preclude SARS-CoV-2 infection, do not rule out co-infections with other pathogens, and should not be used as the sole basis for treatment or other patient management decisions. Negative results must be combined with clinical observations, patient history, and epidemiological information. The expected result is Negative. Fact Sheet for Patients: SugarRoll.be Fact Sheet for Healthcare Providers: https://www.woods-mathews.com/ This test is not yet approved or cleared by the [place] FDA and  has been authorized for detection and/or diagnosis of SARS-CoV-2 by FDA under an Emergency Use Authorization (EUA). This EUA will  remain  in effect (meaning this test can be used) for the duration of the COVID-19 declaration under Section 56 4(b)(1) of the Act, 21 U.S.C. section 360bbb-3(b)(1), unless the authorization is terminated or revoked sooner. Performed at [facility], [place] [address], [place], [place] [address]   SARS CORONAVIRUS 2 (TAT 6-24 HRS) Nasopharyngeal Nasopharyngeal Swab     Status: None   Collection Time: 12/01/18 12:33 AM   Specimen: Nasopharyngeal Swab  Result Value Ref Range Status   SARS Coronavirus 2 NEGATIVE NEGATIVE Final    Comment: (NOTE) SARS-CoV-2 target nucleic acids are NOT DETECTED. The SARS-CoV-2 RNA is generally detectable in upper and lower respiratory specimens during the acute phase of infection. Negative results do not preclude SARS-CoV-2 infection, do not rule out co-infections with other pathogens, and should not be used as the sole basis for treatment or other patient management decisions. Negative results must be combined with clinical observations, patient history, and epidemiological information. The expected result is Negative. Fact Sheet for Patients: SugarRoll.be Fact Sheet for Healthcare Providers: https://www.woods-mathews.com/ This test is not yet approved or cleared by the [place] FDA and  has been authorized for detection and/or diagnosis of SARS-CoV-2 by FDA under an Emergency Use Authorization (EUA). This EUA will remain  in effect (meaning this test can be used) for the duration of the COVID-19 declaration under Section 56 4(b)(1) of the Act, 21 U.S.C. section 360bbb-3(b)(1), unless the authorization is terminated or revoked sooner. Performed at [facility], [place] [address], [place], [place] [address]     Procedures and diagnostic studies:  Ct Head Wo Contrast  Result Date: 11/30/2018 CLINICAL DATA:  Syncope, altered LOC EXAM: CT HEAD WITHOUT CONTRAST TECHNIQUE: Contiguous axial images  were obtained from the base of the skull through the vertex without intravenous contrast. COMPARISON:  CT brain 10/30/2018 FINDINGS: Brain: No evidence of acute infarction, hemorrhage, hydrocephalus, extra-axial collection or mass lesion/mass effect. Vascular: No hyperdense vessel or unexpected calcification. Skull: Normal. Negative for fracture or focal lesion. Sinuses/Orbits: Mild mucosal thickening in the sinuses. Other: None IMPRESSION: Negative non contrasted CT appearance of the brain Electronically Signed   By: Donavan Foil M.D.   On: 11/30/2018 22:51   Mr Brain Wo Contrast  Result Date: 12/01/2018 CLINICAL DATA:  18 year old female with  altered mental status. Seizure versus pseudo seizure. Type 1 diabetes with recent hyperglycemia. EXAM: MRI HEAD WITHOUT CONTRAST TECHNIQUE: Multiplanar, multiecho pulse sequences of the brain and surrounding structures were obtained without intravenous contrast. COMPARISON:  Head CT without contrast 11/30/2018. Brain MRI 11/24/2017. FINDINGS: Brain: Stable cerebral volume since 2019. Angular appearance of the ventricles, which can be seen with periventricular leukomalacia (and this patient has a history of pre term birth per the EMR). No restricted diffusion to suggest acute infarction. No midline shift, mass effect, evidence of mass lesion, ventriculomegaly, extra-axial collection or acute intracranial hemorrhage. Cervicomedullary junction and pituitary are within normal limits. Thin slice coronal imaging. The hippocampal formations appear symmetric and within normal limits. Bilateral temporal lobe signal elsewhere also appears normal. Wallace CullensGray and white matter signal is within normal limits throughout the brain. No cortical encephalomalacia. No chronic cerebral blood products identified. Deep gray nuclei, brainstem and cerebellum appear normal. Vascular: Major intracranial vascular flow voids are stable and appear normal. Skull and upper cervical spine: Negative visible  cervical spine. Visualized bone marrow signal is within normal limits. Sinuses/Orbits: Negative orbits, mild motion artifact. Trace paranasal sinus mucosal thickening is stable since 2019. Other: Mastoids remain clear. Visible internal auditory structures appear normal. Scalp and face soft tissues appear negative. IMPRESSION: 1. No acute intracranial abnormality. 2. Stable since 2019 and negative non-contrast MRI appearance of the brain aside from suggestion of some perinatal periventricular leukomalacia. Electronically Signed   By: Odessa FlemingH  Hall M.D.   On: 12/01/2018 05:29   Vas [place] Carotid  Result Date: 12/01/2018 Carotid Arterial Duplex Study Indications:  Syncope. Risk Factors: None. Performing Technologist: Jeb LeveringJill Parker RDMS, RVT  Examination Guidelines: A complete evaluation includes B-mode imaging, spectral Doppler, color Doppler, and power Doppler as needed of all accessible portions of each vessel. Bilateral testing is considered an integral part of a complete examination. Limited examinations for reoccurring indications may be performed as noted.  Right Carotid Findings: +----------+--------+--------+--------+------------------+--------+             PSV cm/s EDV cm/s Stenosis Plaque Description Comments  +----------+--------+--------+--------+------------------+--------+  CCA Prox   151      36                                             +----------+--------+--------+--------+------------------+--------+  CCA Distal 105      23                                             +----------+--------+--------+--------+------------------+--------+  ICA Prox   108      34       Normal                                +----------+--------+--------+--------+------------------+--------+  ICA Distal 101      24                                             +----------+--------+--------+--------+------------------+--------+  ECA        145      26                                              +----------+--------+--------+--------+------------------+--------+ +----------+--------+-------+----------------+-------------------+  PSV cm/s EDV cms Describe         Arm Pressure (mmHG)  +----------+--------+-------+----------------+-------------------+  Subclavian 291              Multiphasic, WNL                      +----------+--------+-------+----------------+-------------------+ +---------+--------+--+--------+--+---------+  Vertebral PSV cm/s 80 EDV cm/s 22 Antegrade  +---------+--------+--+--------+--+---------+  Left Carotid Findings: +----------+--------+--------+--------+------------------+--------+             PSV cm/s EDV cm/s Stenosis Plaque Description Comments  +----------+--------+--------+--------+------------------+--------+  CCA Prox   200      55                                             +----------+--------+--------+--------+------------------+--------+  CCA Distal 165      32                                             +----------+--------+--------+--------+------------------+--------+  ICA Prox   155      52       Normal                                +----------+--------+--------+--------+------------------+--------+  ICA Distal 107      32                                             +----------+--------+--------+--------+------------------+--------+  ECA        166      32                                             +----------+--------+--------+--------+------------------+--------+ +----------+--------+--------+----------------+-------------------+             PSV cm/s EDV cm/s Describe         Arm Pressure (mmHG)  +----------+--------+--------+----------------+-------------------+  Subclavian 269               Multiphasic, WNL                      +----------+--------+--------+----------------+-------------------+ +---------+--------+--+--------+--+---------+  Vertebral PSV cm/s 65 EDV cm/s 19 Antegrade  +---------+--------+--+--------+--+---------+  Summary: Right  Carotid: There is no evidence of stenosis in the right ICA. There was no                evidence of thrombus, dissection, atherosclerotic plaque or                stenosis in the cervical carotid system. Left Carotid: There is no evidence of stenosis in the left ICA. There was no               evidence of thrombus, dissection, atherosclerotic plaque or               stenosis in the cervical carotid system.  *See table(s) above for measurements and observations.     Preliminary     Medications:    atorvastatin  20 mg Oral q1800   busPIRone  10 mg Oral TID   cycloSPORINE  1 drop Both Eyes BID   enoxaparin (LOVENOX) injection  40 mg Subcutaneous Daily   escitalopram  20 mg Oral Daily   FLUoxetine  40 mg Oral Daily   hydrOXYzine  50 mg Oral TID   insulin aspart  0-20 Units Subcutaneous TID WC   insulin aspart  0-5 Units Subcutaneous QHS   insulin glargine  60 Units Subcutaneous Daily   lamoTRIgine  50 mg Oral BID   lubiprostone  24 mcg Oral BID WC   metoCLOPramide  10 mg Oral TID AC & HS   mirtazapine  7.5 mg Oral QHS   multivitamin with minerals  1 tablet Oral Daily   olopatadine  3 drop Both Eyes BID   pantoprazole  40 mg Oral BID   propranolol  20 mg Oral TID   QUEtiapine  25 mg Oral BID   QUEtiapine  400 mg Oral QHS   topiramate  25 mg Oral BID   Continuous Infusions:  sodium chloride 75 mL/hr at 12/01/18 0600     LOS: 0 days   Joseph ArtJessica U Rhylei Mcquaig  [facility]   How to contact the [facility] Attending or Consulting provider 7A - 7P or covering provider during after hours 7P -7A, for this patient?  1. Check the care team in [facility] and look for a) attending/consulting TR[facility] provider listed and b) the [facility] team listed 2. Log into [email or web address] and use [facility]'s universal password to access. If you do not have the password, please contact the hospital operator. 3. Locate the [facility] provider you are looking for under [facility] and page to a number that you can  be directly reached. 4. If you still have difficulty reaching the provider, please page the [facility] (Director on Call) for the Hospitalists listed on amion for assistance.  12/01/2018, 10:40 AM

## 2018-12-01 NOTE — Progress Notes (Addendum)
Inpatient Diabetes Program Recommendations  AACE/ADA: New Consensus Statement on Inpatient Glycemic Control (2015)  Target Ranges:  Prepandial:   less than 140 mg/dL      Peak postprandial:   less than 180 mg/dL (1-2 hours)      Critically ill patients:  140 - 180 mg/dL   Lab Results  Component Value Date   GLUCAP 182 (H) 12/01/2018   HGBA1C 10.2 (H) 11/07/2018   Results for NORETA, KUE (MRN [id number]) as of 12/01/2018 08:28  Ref. Range 11/30/2018 20:15 11/30/2018 23:19 12/01/2018 00:30 12/01/2018 03:05 12/01/2018 08:24  Glucose-Capillary Latest Ref Range: 70 - 99 mg/dL 421 (H) 333 (H) 212 (H) 236 (H) 182 (H)   Review of Glycemic Control  Diabetes history: type 2? Insulin resistant Outpatient Diabetes medications: Lantus 60 units every HS, Humalog 15 units TID Current orders for Inpatient glycemic control: Novolog SENSITIVE every 4 hours  Inpatient Diabetes Program Recommendations:   Noted that patient was just discharged yesterday. (11/23)   Recommend starting patient on Lantus 60 units at HS, increase Novolog correction scale to RESISTENT every 4 hours while NPO. Titrate Lantus dosage as needed.  When eating, will need to consider adding Novolog 10-12 units TID with meals if patient eats at least 50% of meal.  Will continue to monitor blood sugars while in the hospital.  Harvel Ricks RN BSN CDE Diabetes Coordinator Pager: [phone]  8am-5pm

## 2018-12-01 NOTE — TOC Initial Note (Signed)
Transition of Care [facility]) - Initial/Assessment Note    Patient Details  Name: Nancy [name] MRN: [id number] Date of Birth: 2000-03-02  [facility] CM/SW Contact:    Mearl Latin, LCSW Phone Number: 12/01/2018, 4:31 PM  Clinical Narrative:                 Patient re-presents to the hospital from home with her mother. TOC following for high readmission risk score. Chart review complete. Patient cleared by psychiatry. No needs identified currently.    Expected Discharge Plan: Home/Self Care Barriers to Discharge: Continued Medical Work up   Patient Goals and CMS Choice        Expected Discharge Plan and Services Expected Discharge Plan: Home/Self Care       Living arrangements for the past 2 months: Apartment                                      Prior Living Arrangements/Services Living arrangements for the past 2 months: Apartment Lives with:: Parents Patient language and need for interpreter reviewed:: Yes Do you feel safe going back to the place where you live?: Yes      Need for Family Participation in Patient Care: No (Comment) Care giver support system in place?: Yes (comment)   Criminal Activity/Legal Involvement Pertinent to Current Situation/Hospitalization: No - Comment as needed  Activities of Daily Living      Permission Sought/Granted                  Emotional Assessment Appearance:: Appears stated age     Orientation: : Oriented to Self, Oriented to Place, Oriented to  Time, Oriented to Situation Alcohol / Substance Use: Not Applicable Psych Involvement: Yes (comment)(Seen and cleared by psych)  Admission diagnosis:  Syncope [R55] Patient Active Problem List   Diagnosis Date Noted  . Pseudoseizures   . Uncontrolled diabetes mellitus (HCC) 11/28/2018  . Uncontrolled diabetes mellitus with hyperglycemia (HCC) 11/27/2018  . 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  . MDD (major  depressive disorder), recurrent episode, severe (HCC) 11/06/2018  . Nonspecific abnormal electrocardiogram (ECG) (EKG)   . Chest pain of uncertain etiology   . DKA, type 1 (HCC) 10/08/2018  . Hypertensive urgency 10/08/2018  . Conversion disorder with attacks or seizures, acute episode, with psychological stressor 09/16/2018  . MDD (major depressive disorder), recurrent severe, without psychosis (HCC)   . AKI (acute kidney injury) (HCC) 08/04/2018  . Syncope 01/30/2018  . Orthostatic hypotension 01/24/2018  . Chronic abdominal pain 12/24/2017  . Chest pain 12/19/2017  . Nausea and vomiting 08/21/2017  . Generalized abdominal pain 08/21/2017  . Non compliance with medical treatment 01/27/2012  . Adjustment disorder 09/16/2011  . DM (diabetes mellitus), type 1, uncontrolled (HCC) 09/15/2011  . Acanthosis nigricans   . Goiter   . Obesity 06/14/2010  . Hypertension 06/14/2010   PCP:  Claiborne Rigg, NP Pharmacy:   [facility] [phone] - [place], [place] - [address] AT [address] [facility] [address] [place] [address] Phone: [phone] Fax: [phone]  [facility] [place], [place] - [place] [address] [place] [address] Phone: [phone] Fax: [phone]  [facility] [place], [place] - [address] [place] [address] Phone: [phone] Fax: [phone]  Social Determinants of Health (SDOH) Interventions    Readmission Risk Interventions Readmission Risk Prevention Plan 08/05/2018 06/15/2018  Transportation Screening Complete Complete  PCP or Specialist Appt within 3-5 Days Complete -  Social Work Consult for [place] Planning/Counseling Not Complete -  SW consult not completed comments no needs -  Palliative Care Screening Not Applicable -  Medication Review Press photographer) Referral to Pharmacy Complete  PCP or Specialist  appointment within 3-5 days of discharge - Complete  HRI or [place] - Complete  SW Recovery Care/Counseling Consult - Complete  [place] - Not Applicable  Some recent data might be hidden

## 2018-12-01 NOTE — Progress Notes (Signed)
EEG Completed; Results Pending  

## 2018-12-02 DIAGNOSIS — F4329 Adjustment disorder with other symptoms: Secondary | ICD-10-CM

## 2018-12-02 LAB — BASIC METABOLIC PANEL
Anion gap: 9 (ref 5–15)
BUN: 10 mg/dL (ref 6–20)
CO2: 22 mmol/L (ref 22–32)
Calcium: 8.3 mg/dL — ABNORMAL LOW (ref 8.9–10.3)
Chloride: 106 mmol/L (ref 98–111)
Creatinine, Ser: 1.23 mg/dL — ABNORMAL HIGH (ref 0.44–1.00)
GFR calc Af Amer: >60 mL/min (ref >60)
GFR calc non Af Amer: >60 mL/min (ref >60)
Glucose, Bld: 91 mg/dL (ref 70–99)
Potassium: 3.6 mmol/L (ref 3.5–5.1)
Sodium: 137 mmol/L (ref 135–145)

## 2018-12-02 LAB — GLUCOSE, CAPILLARY
Glucose-Capillary: 374 mg/dL — ABNORMAL HIGH (ref 70–99)
Glucose-Capillary: 156 mg/dL — ABNORMAL HIGH (ref 70–99)
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
## 2018-12-02 NOTE — Progress Notes (Signed)
Patient discharged home with self care. Education and information provided to patient. IV removed. CCMD notified. All belongings with patient. Leaving unit via wheelchair.

## 2018-12-02 NOTE — TOC Transition Note (Signed)
Transition of Care [facility]) - CM/SW Discharge Note   Patient Details  Name: Nancy [name] MRN: [id number] Date of Birth: 05-24-2000  [facility] CM/SW Contact:  Pollie Friar, RN Phone Number: 12/02/2018, 12:42 PM   Clinical Narrative:    Pt discharging home. She has transportation home. No changes to her home medications.   Final next level of care: Home/Self Care Barriers to Discharge: No Barriers Identified   Patient Goals and CMS Choice        Discharge Placement                       Discharge Plan and Services                                     Social Determinants of Health (SDOH) Interventions     Readmission Risk Interventions Readmission Risk Prevention Plan 12/01/2018 08/05/2018 06/15/2018  Transportation Screening Complete Complete Complete  PCP or Specialist Appt within 3-5 Days - Complete -  Social Work Consult for [place] Planning/Counseling - Not Complete -  SW consult not completed comments - no needs -  Palliative Care Screening - Not Applicable -  Medication Review (RN Care Manager) Referral to Pharmacy Referral to Pharmacy Complete  PCP or Specialist appointment within 3-5 days of discharge Complete - Complete  HRI or Home Care Consult Complete - Complete  SW Recovery Care/Counseling Consult Complete - Complete  Palliative Care Screening Not Applicable - Not [place] Not Applicable - Not Applicable  Some recent data might be hidden

## 2018-12-02 NOTE — Progress Notes (Signed)
Progress Note    Nancy [name]  WUJ:[id number] DOB: Jun 11, 2000  DOA: 11/30/2018 PCP: Claiborne Rigg, NP    Brief Narrative:     Medical records reviewed and are as summarized below:  Nancy [name] is an 18 y.o. female w diabetes type 1, , CHF (EF 60-65%), w normal cardiac cath 10/13/2018, h/o esophagitis/ candidiasis, seizure/ and/or pseudoseizures, depression with anxiety w recent admission for Hyperglycemia, apparently presents tonight due to syncope, and in ER had witnessed seizure per nursing staff? She has had 26 ER/hospitalizations in last 6 months.    Assessment/Plan:   Principal Problem:   Syncope Active Problems:   Hypertension   AKI (acute kidney injury) (HCC)   Pseudoseizures    Reported Syncope/seizure (h/o pseudoseizures) -negative troponin -Carotid ultrasound by admitting MD- no thrombus -Cardiac echo ordered by admitting MD- preserved EF -MRI: No acute intracranial abnormality - Keppra  IV given in ER- will NOT continue-- see below -EEG normal -on Neurontin/topamax and lamictal -chart review shows:  LTM EEG preliminary report, 8/1:  EEG reviewed till 7am.Two events were captured on 08/06/2018 at 1822 and 2009. During both events, patient started having tremor like movements in her right leg which gradually progressed to whole body tremoring. Eyes were open during first event ( unable to clearly see during second) and she responded to RN shaking her head sideways when asked if she can stop the movement. It abruptly stopped after about 1 minute. Concomitant EEG did not show seizure before, during and after the event. 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 Lesslikely epilepsy given herprior unremarkableEEG's and examfindingstodaywithout postictal state following spells. Per mother, the patient has demonstrated notable cognitive slowing since startingKeppra. Restarting her on this  medication is not currently indicated given the pseudoseizures seen on overnight EEG. She may also have a concomitant epileptic seizure disorder, but this can be covered empirically with her home Topamax and Neurontin until outpatient Neurology follow up.  -psych consult: Patient states "my living situation is stressful I live with my mom and my mom's girlfriend, we do not get along and my mom threatens to put me out this is what triggers my seizures."    Dm2 w hyperglycemia NOT on a glucose stabilizer -SSI plus lantus -appreciated diabetic coordination consult -blood sugars appear labile-- patient states she is not eating, will ask nurse to monitor and document intake  AKI -? Due to increased blood sugars -IVF -Cr trending down  Depression/ Anxiety Cont Remeron 7.5mg  po qhs Cont Seroquel Cont Inderal Cont Lexapro  po qday Cont Buspiraone  po tid Psychiatry consult: Plan discharge home, [facility] at [facility] outpatient  Gerd Cont PPI  Nausea/ vomiting Cont Reglan -avoid IV pain meds  Constipation DC Amitiza -> associated with syncope miralax 17g po qday prn   Lactic acidosis -trending down  obesity Body mass index is 35.07 kg/m.   Family Communication/Anticipated D/C date and plan/Code Status   DVT prophylaxis: Lovenox ordered. Code Status: Full Code.  Family Communication:  Disposition Plan: pending work up   Scientist, physiological:    None.     Subjective:   Says she is not eating Patient told psych: "my living situation is stressful I live with my mom and my mom's girlfriend, we do not get along and my mom threatens to put me out this is what triggers my seizures."    Objective:    Vitals:   12/02/18 0015 12/02/18 0426 12/02/18 0448 12/02/18 0758  BP: Marland Kitchen)  93/58 (!) 102/57 (!) 162/80 (!) 111/50  Pulse: (!) 105 93 76 66  Resp:  18 18 18   Temp:  98.5 F (36.9 C) 98.8 F (37.1 C) 99.8 F (37.7 C)  TempSrc:  Oral Oral Oral  SpO2:  98% 100% 93% 100%  Weight:      Height:        Intake/Output Summary (Last 24 hours) at 12/02/2018 9355 Last data filed at 12/02/2018 0603 Gross per 24 hour  Intake 480 ml  Output 2300 ml  Net -1820 ml   Filed Weights   11/30/18 2015  Weight: 89.8 kg    Exam: In bed, NAD Tearful at times Tachycardic Poor insight, poor eye contact  Data Reviewed:   I have personally reviewed following labs and imaging studies:  Labs: Labs show the following:   Basic Metabolic Panel: Recent Labs  Lab 11/28/18 0823 11/29/18 0234 11/30/18 2022 11/30/18 2131 12/01/18 0120 12/01/18 0242 12/02/18 0327  NA 134* 133* 134* 135 140 141 137  K 4.1 3.9 4.6 4.5 3.9 3.9 3.6  CL 104 102 106  --   --  109 106  CO2 23 24 17*  --   --  23 22  GLUCOSE 473* 360* 464*  --   --  235* 91  BUN 7 13 28*  --   --  20 10  CREATININE 0.64 0.79 1.46*  --   --  1.77* 1.23*  CALCIUM 8.1* 8.5* 9.2  --   --  9.0 8.3*   GFR Estimated Creatinine Clearance: 77.6 mL/min (A) (by C-G formula based on SCr of 1.23 mg/dL (H)). Liver Function Tests: Recent Labs  Lab 11/27/18 2135 11/29/18 0234 11/30/18 2022 12/01/18 0242  AST 21 12* 15 11*  ALT 13 13 15 14   ALKPHOS 95 70 81 70  BILITOT 0.3 0.1* 0.4 0.6  PROT 6.3* 5.6* 6.3* 6.1*  ALBUMIN 3.6 3.1* 3.8 3.5   Recent Labs  Lab 11/27/18 2057  LIPASE 26   No results for input(s): AMMONIA in the last 168 hours. Coagulation profile No results for input(s): INR, PROTIME in the last 168 hours.  CBC: Recent Labs  Lab 11/27/18 2057  11/28/18 0600 11/30/18 2022 11/30/18 2131 12/01/18 0120 12/01/18 0242  WBC 9.3  --  8.3 12.2*  --   --  12.5*  NEUTROABS  --   --   --  6.9  --   --   --   HGB 12.1   < > 10.8* 12.6 12.2 12.2 12.4  HCT 38.4   < > 33.6* 38.6 36.0 36.0 37.4  MCV 87.5  --  84.4 83.5  --   --  82.6  PLT 283  --  251 297  --   --  306   < > = values in this interval not displayed.   Cardiac Enzymes: No results for input(s): CKTOTAL, CKMB,  CKMBINDEX, TROPONINI in the last 168 hours. BNP (last 3 results) No results for input(s): PROBNP in the last 8760 hours. CBG: Recent Labs  Lab 12/01/18 0824 12/01/18 1116 12/01/18 1705 12/01/18 2126 12/02/18 0612  GLUCAP 182* 191* 257* 189* 374*   D-Dimer: No results for input(s): DDIMER in the last 72 hours. Hgb A1c: No results for input(s): HGBA1C in the last 72 hours. Lipid Profile: No results for input(s): CHOL, HDL, LDLCALC, TRIG, CHOLHDL, LDLDIRECT in the last 72 hours. Thyroid function studies: No results for input(s): TSH, T4TOTAL, T3FREE, THYROIDAB in the last 72 hours.  Invalid  input(s): FREET3 Anemia work up: No results for input(s): VITAMINB12, FOLATE, FERRITIN, TIBC, IRON, RETICCTPCT in the last 72 hours. Sepsis Labs: Recent Labs  Lab 11/27/18 2057 11/28/18 0600 11/30/18 2022 11/30/18 2159 11/30/18 2343 12/01/18 0242  WBC 9.3 8.3 12.2*  --   --  12.5*  LATICACIDVEN  --   --   --  3.8* 2.6*  --     Microbiology Recent Results (from the past 240 hour(s))  SARS CORONAVIRUS 2 (TAT 6-24 HRS) Nasopharyngeal Nasopharyngeal Swab     Status: None   Collection Time: 11/28/18  1:04 AM   Specimen: Nasopharyngeal Swab  Result Value Ref Range Status   SARS Coronavirus 2 NEGATIVE NEGATIVE Final    Comment: (NOTE) SARS-CoV-2 target nucleic acids are NOT DETECTED. The SARS-CoV-2 RNA is generally detectable in upper and lower respiratory specimens during the acute phase of infection. Negative results do not preclude SARS-CoV-2 infection, do not rule out co-infections with other pathogens, and should not be used as the sole basis for treatment or other patient management decisions. Negative results must be combined with clinical observations, patient history, and epidemiological information. The expected result is Negative. Fact Sheet for Patients: HairSlick.no Fact Sheet for Healthcare  Providers: quierodirigir.com This test is not yet approved or cleared by the [place] FDA and  has been authorized for detection and/or diagnosis of SARS-CoV-2 by FDA under an Emergency Use Authorization (EUA). This EUA will remain  in effect (meaning this test can be used) for the duration of the COVID-19 declaration under Section 56 4(b)(1) of the Act, 21 U.S.C. section 360bbb-3(b)(1), unless the authorization is terminated or revoked sooner. Performed at [facility], [address] [place], [place] [address]   SARS CORONAVIRUS 2 (TAT 6-24 HRS) Nasopharyngeal Nasopharyngeal Swab     Status: None   Collection Time: 12/01/18 12:33 AM   Specimen: Nasopharyngeal Swab  Result Value Ref Range Status   SARS Coronavirus 2 NEGATIVE NEGATIVE Final    Comment: (NOTE) SARS-CoV-2 target nucleic acids are NOT DETECTED. The SARS-CoV-2 RNA is generally detectable in upper and lower respiratory specimens during the acute phase of infection. Negative results do not preclude SARS-CoV-2 infection, do not rule out co-infections with other pathogens, and should not be used as the sole basis for treatment or other patient management decisions. Negative results must be combined with clinical observations, patient history, and epidemiological information. The expected result is Negative. Fact Sheet for Patients: HairSlick.no Fact Sheet for Healthcare Providers: quierodirigir.com This test is not yet approved or cleared by the [place] FDA and  has been authorized for detection and/or diagnosis of SARS-CoV-2 by FDA under an Emergency Use Authorization (EUA). This EUA will remain  in effect (meaning this test can be used) for the duration of the COVID-19 declaration under Section 56 4(b)(1) of the Act, 21 U.S.C. section 360bbb-3(b)(1), unless the authorization is terminated or revoked sooner. Performed at  [facility], [address], [place], [place] [address]     Procedures and diagnostic studies:  Ct Head Wo Contrast  Result Date: 11/30/2018 CLINICAL DATA:  Syncope, altered LOC EXAM: CT HEAD WITHOUT CONTRAST TECHNIQUE: Contiguous axial images were obtained from the base of the skull through the vertex without intravenous contrast. COMPARISON:  CT brain 10/30/2018 FINDINGS: Brain: No evidence of acute infarction, hemorrhage, hydrocephalus, extra-axial collection or mass lesion/mass effect. Vascular: No hyperdense vessel or unexpected calcification. Skull: Normal. Negative for fracture or focal lesion. Sinuses/Orbits: Mild mucosal thickening in the  sinuses. Other: None IMPRESSION: Negative non contrasted CT appearance of the brain Electronically Signed   By: Jasmine PangKim  Fujinaga M.D.   On: 11/30/2018 22:51   Mr Brain Wo Contrast  Result Date: 12/01/2018 CLINICAL DATA:  18 year old female with altered mental status. Seizure versus pseudo seizure. Type 1 diabetes with recent hyperglycemia. EXAM: MRI HEAD WITHOUT CONTRAST TECHNIQUE: Multiplanar, multiecho pulse sequences of the brain and surrounding structures were obtained without intravenous contrast. COMPARISON:  Head CT without contrast 11/30/2018. Brain MRI 11/24/2017. FINDINGS: Brain: Stable cerebral volume since 2019. Angular appearance of the ventricles, which can be seen with periventricular leukomalacia (and this patient has a history of pre term birth per the EMR). No restricted diffusion to suggest acute infarction. No midline shift, mass effect, evidence of mass lesion, ventriculomegaly, extra-axial collection or acute intracranial hemorrhage. Cervicomedullary junction and pituitary are within normal limits. Thin slice coronal imaging. The hippocampal formations appear symmetric and within normal limits. Bilateral temporal lobe signal elsewhere also appears normal. Wallace CullensGray and white matter signal is within normal limits throughout the brain. No  cortical encephalomalacia. No chronic cerebral blood products identified. Deep gray nuclei, brainstem and cerebellum appear normal. Vascular: Major intracranial vascular flow voids are stable and appear normal. Skull and upper cervical spine: Negative visible cervical spine. Visualized bone marrow signal is within normal limits. Sinuses/Orbits: Negative orbits, mild motion artifact. Trace paranasal sinus mucosal thickening is stable since 2019. Other: Mastoids remain clear. Visible internal auditory structures appear normal. Scalp and face soft tissues appear negative. IMPRESSION: 1. No acute intracranial abnormality. 2. Stable since 2019 and negative non-contrast MRI appearance of the brain aside from suggestion of some perinatal periventricular leukomalacia. Electronically Signed   By: Odessa FlemingH  Hall M.D.   On: 12/01/2018 05:29   [place] Carotid  Result Date: 12/01/2018 Carotid Arterial Duplex Study Indications:  Syncope. Risk Factors: None. Performing Technologist: Jeb LeveringJill Parker RDMS, RVT  Examination Guidelines: A complete evaluation includes B-mode imaging, spectral Doppler, color Doppler, and power Doppler as needed of all accessible portions of each vessel. Bilateral testing is considered an integral part of a complete examination. Limited examinations for reoccurring indications may be performed as noted.  Right Carotid Findings: +----------+--------+--------+--------+------------------+--------+             PSV cm/s EDV cm/s Stenosis Plaque Description Comments  +----------+--------+--------+--------+------------------+--------+  CCA Prox   151      36                                             +----------+--------+--------+--------+------------------+--------+  CCA Distal 105      23                                             +----------+--------+--------+--------+------------------+--------+  ICA Prox   108      34       Normal                                 +----------+--------+--------+--------+------------------+--------+  ICA Distal 101      24                                             +----------+--------+--------+--------+------------------+--------+  ECA        145      26                                             +----------+--------+--------+--------+------------------+--------+ +----------+--------+-------+----------------+-------------------+             PSV cm/s EDV cms Describe         Arm Pressure (mmHG)  +----------+--------+-------+----------------+-------------------+  Subclavian 291              Multiphasic, WNL                      +----------+--------+-------+----------------+-------------------+ +---------+--------+--+--------+--+---------+  Vertebral PSV cm/s 80 EDV cm/s 22 Antegrade  +---------+--------+--+--------+--+---------+  Left Carotid Findings: +----------+--------+--------+--------+------------------+--------+             PSV cm/s EDV cm/s Stenosis Plaque Description Comments  +----------+--------+--------+--------+------------------+--------+  CCA Prox   200      55                                             +----------+--------+--------+--------+------------------+--------+  CCA Distal 165      32                                             +----------+--------+--------+--------+------------------+--------+  ICA Prox   155      52       Normal                                +----------+--------+--------+--------+------------------+--------+  ICA Distal 107      32                                             +----------+--------+--------+--------+------------------+--------+  ECA        166      32                                             +----------+--------+--------+--------+------------------+--------+ +----------+--------+--------+----------------+-------------------+             PSV cm/s EDV cm/s Describe         Arm Pressure (mmHG)  +----------+--------+--------+----------------+-------------------+  Subclavian 269                Multiphasic, WNL                      +----------+--------+--------+----------------+-------------------+ +---------+--------+--+--------+--+---------+  Vertebral PSV cm/s 65 EDV cm/s 19 Antegrade  +---------+--------+--+--------+--+---------+  Summary: Right Carotid: There is no evidence of stenosis in the right ICA. There was no                evidence of thrombus, dissection, atherosclerotic plaque or                stenosis in the cervical carotid system. Left Carotid: There is no evidence  of stenosis in the left ICA. There was no               evidence of thrombus, dissection, atherosclerotic plaque or               stenosis in the cervical carotid system.  *See table(s) above for measurements and observations.  Electronically signed by Lemar LivingsBrandon Cain MD on 12/01/2018 at 1:53:58 PM.    Final     Medications:    atorvastatin  20 mg Oral q1800   busPIRone  10 mg Oral TID   cycloSPORINE  1 drop Both Eyes BID   enoxaparin (LOVENOX) injection  40 mg Subcutaneous Daily   escitalopram  20 mg Oral Daily   FLUoxetine  40 mg Oral Daily   hydrOXYzine  50 mg Oral TID   insulin aspart  0-20 Units Subcutaneous TID WC   insulin aspart  0-5 Units Subcutaneous QHS   insulin glargine  60 Units Subcutaneous Daily   lamoTRIgine  50 mg Oral BID   lubiprostone  24 mcg Oral BID WC   metoCLOPramide  10 mg Oral TID AC & HS   mirtazapine  7.5 mg Oral QHS   multivitamin with minerals  1 tablet Oral Daily   olopatadine  3 drop Both Eyes BID   pantoprazole  40 mg Oral BID   propranolol  20 mg Oral TID   QUEtiapine  25 mg Oral BID   QUEtiapine  400 mg Oral QHS   topiramate  25 mg Oral BID   Continuous Infusions:    LOS: 1 day   Joseph ArtJessica U Nyla Creason  [facility]   How to contact the [facility] Attending or Consulting provider 7A - 7P or covering provider during after hours 7P -7A, for this patient?  1. Check the care team in [facility] and look for a) attending/consulting [facility] provider listed  and b) the [facility] team listed 2. Log into [email or web address] and use [place]'s universal password to access. If you do not have the password, please contact the hospital operator. 3. Locate the [facility] provider you are looking for under [facility] and page to a number that you can be directly reached. 4. If you still have difficulty reaching the provider, please page the [facility] (Director on Call) for the Hospitalists listed on amion for assistance.  12/02/2018, 9:27 AM

## 2018-12-03 ENCOUNTER — Other Ambulatory Visit: Payer: Self-pay

## 2018-12-03 ENCOUNTER — Emergency Department (HOSPITAL_COMMUNITY)
Admission: EM | Admit: 2018-12-03 | Discharge: 2018-12-03 | Disposition: A | Discharge status: Home / Self Care | Payer: Medicaid Other | Attending: Emergency Medicine | Admitting: Emergency Medicine

## 2018-12-03 DIAGNOSIS — R112 Nausea with vomiting, unspecified: Secondary | ICD-10-CM | POA: Insufficient documentation

## 2018-12-03 DIAGNOSIS — I110 Hypertensive heart disease with heart failure: Secondary | ICD-10-CM | POA: Diagnosis not present

## 2018-12-03 DIAGNOSIS — I509 Heart failure, unspecified: Secondary | ICD-10-CM | POA: Diagnosis not present

## 2018-12-03 DIAGNOSIS — R569 Unspecified convulsions: Secondary | ICD-10-CM | POA: Insufficient documentation

## 2018-12-03 DIAGNOSIS — Z79899 Other long term (current) drug therapy: Secondary | ICD-10-CM | POA: Insufficient documentation

## 2018-12-03 DIAGNOSIS — Z794 Long term (current) use of insulin: Secondary | ICD-10-CM | POA: Insufficient documentation

## 2018-12-03 DIAGNOSIS — E119 Type 2 diabetes mellitus without complications: Secondary | ICD-10-CM | POA: Diagnosis not present

## 2018-12-03 LAB — POCT I-STAT EG7
Acid-base deficit: 2 mmol/L (ref 0.0–2.0)
Bicarbonate: 23.2 mmol/L (ref 20.0–28.0)
Calcium, Ion: 1.19 mmol/L (ref 1.15–1.40)
HCT: 39 % (ref 36.0–46.0)
Hemoglobin: 13.3 g/dL (ref 12.0–15.0)
O2 Saturation: 80 %
Potassium: 4.1 mmol/L (ref 3.5–5.1)
Sodium: 136 mmol/L (ref 135–145)
TCO2: 24 mmol/L (ref 22–32)
pCO2, Ven: 41 mmHg — ABNORMAL LOW (ref 44.0–60.0)
pH, Ven: 7.36 (ref 7.250–7.430)
pO2, Ven: 46 mmHg — ABNORMAL HIGH (ref 32.0–45.0)

## 2018-12-03 LAB — CBC WITH DIFFERENTIAL/PLATELET
Abs Immature Granulocytes: 0.02 10*3/uL (ref 0.00–0.07)
Basophils Absolute: 0 10*3/uL (ref 0.0–0.1)
Basophils Relative: 1 %
Eosinophils Absolute: 0.1 10*3/uL (ref 0.0–0.5)
Eosinophils Relative: 1 %
HCT: 39.6 % (ref 36.0–46.0)
Hemoglobin: 12.7 g/dL (ref 12.0–15.0)
Immature Granulocytes: 0 %
Lymphocytes Relative: 28 %
Lymphs Abs: 2.4 10*3/uL (ref 0.7–4.0)
MCH: 27.2 pg (ref 26.0–34.0)
MCHC: 32.1 g/dL (ref 30.0–36.0)
MCV: 84.8 fL (ref 80.0–100.0)
Monocytes Absolute: 1 10*3/uL (ref 0.1–1.0)
Monocytes Relative: 12 %
Neutro Abs: 5.1 10*3/uL (ref 1.7–7.7)
Neutrophils Relative %: 58 %
Platelets: 332 10*3/uL (ref 150–400)
RBC: 4.67 MIL/uL (ref 3.87–5.11)
RDW: 13.6 % (ref 11.5–15.5)
WBC: 8.7 10*3/uL (ref 4.0–10.5)
nRBC: 0 % (ref 0.0–0.2)

## 2018-12-03 LAB — COMPREHENSIVE METABOLIC PANEL
ALT: 16 U/L (ref 0–44)
AST: 15 U/L (ref 15–41)
Albumin: 4 g/dL (ref 3.5–5.0)
Alkaline Phosphatase: 68 U/L (ref 38–126)
Anion gap: 12 (ref 5–15)
BUN: 10 mg/dL (ref 6–20)
CO2: 21 mmol/L — ABNORMAL LOW (ref 22–32)
Calcium: 9.2 mg/dL (ref 8.9–10.3)
Chloride: 105 mmol/L (ref 98–111)
Creatinine, Ser: 0.77 mg/dL (ref 0.44–1.00)
GFR calc Af Amer: >60 mL/min (ref >60)
GFR calc non Af Amer: >60 mL/min (ref >60)
Glucose, Bld: 148 mg/dL — ABNORMAL HIGH (ref 70–99)
Potassium: 3.9 mmol/L (ref 3.5–5.1)
Sodium: 138 mmol/L (ref 135–145)
Total Bilirubin: 0.7 mg/dL (ref 0.3–1.2)
Total Protein: 7 g/dL (ref 6.5–8.1)

## 2018-12-03 LAB — LIPASE, BLOOD: Lipase: 27 U/L (ref 11–51)

## 2018-12-03 LAB — CBG MONITORING, ED: Glucose-Capillary: 143 mg/dL — ABNORMAL HIGH (ref 70–99)

## 2018-12-03 LAB — I-STAT BETA HCG BLOOD, ED (MC, WL, AP ONLY): I-stat hCG, quantitative: <5 m[IU]/mL (ref <5)

## 2018-12-03 LAB — TROPONIN I (HIGH SENSITIVITY): Troponin I (High Sensitivity): <2 ng/L (ref <18)

## 2018-12-03 MED ORDER — METOCLOPRAMIDE HCL 5 MG/ML IJ SOLN
10.0000 mg | Freq: Once | INTRAMUSCULAR | Status: AC
  Administered 2018-12-03: 17:00:00 10 mg via INTRAVENOUS
  Filled 2018-12-03: qty 2

## 2018-12-03 MED ORDER — SODIUM CHLORIDE 0.9 % IV BOLUS
1000.0000 mL | Freq: Once | INTRAVENOUS | Status: AC
  Administered 2018-12-03: 1000 mL via INTRAVENOUS

## 2018-12-03 NOTE — ED Provider Notes (Signed)
[facility] EMERGENCY DEPARTMENT Provider Note   CSN: [id number] Arrival date & time: 12/03/18  1613     History   Chief Complaint No chief complaint on file.   HPI Nancy [name] is a 18 y.o. 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  Patient was just discharged from admission yesterday.  Patient had a 2-day admission and was admitted on 11/27/18 DKA.  Patient reports generalized abdominal pain as well as ongoing, sharp chest pain that has been present over the past several weeks.  Patient reports this is due to her heart failure.  Per chart review, patient had a normal EF 1 month ago, unsure where this diagnosis was given.  Patient denies any diarrhea, urinary symptoms, fever, shortness of breath.  Patient has had some sparse blood seen in emesis.  She has been taking Phenergan, Reglan, Bentyl at home without relief.     HPI  Past Medical History:  Diagnosis Date  . Acanthosis nigricans   . Anxiety   . CHF (congestive heart failure) (HCC)   . Chronic lower back pain   . Depression   . DKA, type 1 (HCC) 09/13/2018  . Dyspepsia   . Obesity   . Ovarian cyst    pt is not aware of this hx (11/24/2017)  . Pre-diabetes   . 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 Premature baby   . Seizures (HCC)   . Type II diabetes mellitus (HCC)    insulin dependant    Patient Active Problem List   Diagnosis Date Noted  . Pseudoseizures   . Uncontrolled diabetes mellitus (HCC) 11/28/2018  . Uncontrolled diabetes mellitus with hyperglycemia (HCC) 11/27/2018  . 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  . MDD (major depressive disorder), recurrent episode, severe (HCC) 11/06/2018  . Nonspecific abnormal electrocardiogram (ECG) (EKG)   . Chest pain of uncertain etiology   . Hypertensive  urgency 10/08/2018  . Conversion disorder with attacks or seizures, acute episode, with psychological stressor 09/16/2018  . MDD (major depressive disorder), recurrent severe, without psychosis (HCC)   . AKI (acute kidney injury) (HCC) 08/04/2018  . Syncope 01/30/2018  . Orthostatic hypotension 01/24/2018  . Chronic abdominal pain 12/24/2017  . Chest pain 12/19/2017  . Nausea and vomiting 08/21/2017  . Generalized abdominal pain 08/21/2017  . Non compliance with medical treatment 01/27/2012  . Adjustment disorder 09/16/2011  . Acanthosis nigricans   . Goiter   . Obesity 06/14/2010  . Hypertension 06/14/2010    Past Surgical History:  Procedure Laterality Date  . ABDOMINAL HERNIA REPAIR     "I was a baby"  . BIOPSY  10/12/2018   Procedure: BIOPSY;  Surgeon: Lynann Bologna, MD;  Location: [facility] ENDOSCOPY;  Service: Endoscopy;;  . ESOPHAGOGASTRODUODENOSCOPY (EGD) WITH PROPOFOL N/A 10/12/2018   Procedure: ESOPHAGOGASTRODUODENOSCOPY (EGD) WITH PROPOFOL;  Surgeon: Lynann Bologna, MD;  Location: [facility] ENDOSCOPY;  Service: Endoscopy;  Laterality: N/A;  . HERNIA REPAIR    . LEFT HEART CATH AND CORONARY ANGIOGRAPHY N/A 10/13/2018   Procedure: LEFT HEART CATH AND CORONARY ANGIOGRAPHY;  Surgeon: Kathleene Hazel, MD;  Location: [facility] INVASIVE CV LAB;  Service: Cardiovascular;  Laterality: N/A;  . TONSILLECTOMY AND ADENOIDECTOMY    . WISDOM TOOTH EXTRACTION  2017     OB History   No obstetric history on file.      Home Medications  Prior to Admission medications   Medication Sig Start Date End Date Taking? Authorizing Provider  atorvastatin (LIPITOR) 20 MG tablet Take 1 tablet (20 mg total) by mouth daily at 6 PM. 10/05/18   Storm Frisk, MD  busPIRone (BUSPAR) 10 MG tablet Take 10 mg by mouth 3 (three) times daily. 11/19/18   [provider]  dicyclomine (BENTYL) 10 MG capsule Take 10 mg by mouth 3 (three) times daily as needed (for abdominal pain).    [provider]  escitalopram (LEXAPRO) 20 MG tablet Take 20 mg by mouth daily.    [provider]  FLUoxetine (PROZAC) 40 MG capsule Take 40 mg by mouth daily.    [provider]  hydrOXYzine (ATARAX/VISTARIL) 50 MG tablet Take 1 tablet (50 mg total) by mouth 3 (three) times daily as needed for anxiety. Patient taking differently: Take 50 mg by mouth 3 (three) times daily.  11/11/18   Armandina Stammer I, NP  insulin glargine (LANTUS) 100 UNIT/ML injection Inject 0.6 mLs (60 Units total) into the skin at bedtime. For diabetes management 11/11/18   Armandina Stammer I, NP  insulin lispro (HUMALOG) 100 UNIT/ML injection Inject 0.15 mLs (15 Units total) into the skin 2 (two) times daily with a meal. 10/05/18   Storm Frisk, MD  lamoTRIgine (LAMICTAL) 25 MG tablet Take 2 tablets (50 mg total) by mouth 2 (two) times daily. For mood stabilization 11/11/18   Armandina Stammer I, NP  lubiprostone (AMITIZA) 24 MCG capsule Take 1 capsule (24 mcg total) by mouth 2 (two) times daily with a meal. 10/27/18   Esterwood, Amy S, PA-C  meloxicam (MOBIC) 7.5 MG tablet Take 7.5 mg by mouth daily as needed for pain.    [provider]  metoCLOPramide (REGLAN) 10 MG tablet Take 1 tablet (10 mg total) by mouth 4 (four) times daily -  before meals and at bedtime. 11/30/18 12/30/18  Jerald Kief, MD  mirtazapine (REMERON) 7.5 MG tablet Take 1 tablet (7.5 mg total) by mouth at bedtime. For depression/sleep 11/11/18   Armandina Stammer I, NP  Multiple Vitamin (MULTIVITAMIN WITH MINERALS) TABS tablet Take 1 tablet by mouth daily. 09/18/18   Clapacs, Jackquline Denmark, MD  Olopatadine HCl (PAZEO) 0.7 % SOLN Place 3 drops into both eyes 2 (two) times daily.    [provider]  pantoprazole (PROTONIX) 40 MG tablet Take 1 tablet (40 mg total) by mouth 2 (two) times daily. For acid reflux 11/11/18   Armandina Stammer I, NP  promethazine (PHENERGAN) 25 MG suppository Place 25 mg rectally every 8 (eight) hours as needed for nausea or vomiting.     [provider]  propranolol (INDERAL) 20 MG tablet Take 1 tablet (20 mg total) by mouth 3 (three) times daily. For anxiety/HTN 11/11/18   Armandina Stammer I, NP  QUEtiapine (SEROQUEL) 200 MG tablet Take 400 mg by mouth at bedtime.     [provider]  QUEtiapine (SEROQUEL) 25 MG tablet Take 1 tablet (25 mg total) by mouth 2 (two) times daily. For agitation 11/11/18   Armandina Stammer I, NP  RESTASIS 0.05 % ophthalmic emulsion Place 1 drop into both eyes 2 (two) times daily. 11/16/18   [provider]  topiramate (TOPAMAX) 25 MG tablet Take 1 tablet (25 mg total) by mouth 2 (two) times daily. For seizure activities 11/11/18   Sanjuana Kava, NP    Family History Family History  Problem Relation Age of Onset  . Diabetes Mother   .  Hypertension Mother   . Obesity Mother   . Asthma Mother   . Allergic rhinitis Mother   . Eczema Mother   . Cervical cancer Mother   . Diabetes Father   . Hypertension Father   . Obesity Father   . Hyperlipidemia Father   . Hypertension Paternal Aunt   . Hypertension Maternal Grandfather   . Colon cancer Maternal Grandfather   . Diabetes Paternal Grandmother   . Obesity Paternal Grandmother   . Diabetes Paternal Grandfather   . Obesity Paternal Grandfather   . Angioedema Neg Hx   . Immunodeficiency Neg Hx   . Urticaria Neg Hx   . Stomach cancer Neg Hx   . Esophageal cancer Neg Hx     Social History Social History   Tobacco Use  . Smoking status: Never Smoker  . Smokeless tobacco: Never Used  Substance Use Topics  . Alcohol use: No    Alcohol/week: 0.0 standard drinks  . Drug use: No     Allergies   Ibuprofen and Oatmeal   Review of Systems Review of Systems  Constitutional: Negative for chills and fever.  HENT: Negative for facial swelling and sore throat.   Respiratory: Negative for shortness of breath.   Cardiovascular: Positive for chest pain.  Gastrointestinal: Positive for abdominal pain, nausea and vomiting.  Negative for diarrhea.  Genitourinary: Negative for dysuria.  Musculoskeletal: Negative for back pain.  Skin: Negative for rash and wound.  Neurological: Positive for seizures. Negative for headaches.  Psychiatric/Behavioral: The patient is not nervous/anxious.      Physical Exam Updated Vital Signs BP (!) 129/96   Pulse (!) 101   Temp 97.8 F (36.6 C) (Oral)   Resp 13   SpO2 100%   Physical Exam Vitals signs and nursing note reviewed.  Constitutional:      General: She is not in acute distress.    Appearance: She is well-developed. She is not diaphoretic.  HENT:     Head: Normocephalic and atraumatic.     Mouth/Throat:     Pharynx: No oropharyngeal exudate.  Eyes:     General: No scleral icterus.       Right eye: No discharge.        Left eye: No discharge.     Conjunctiva/sclera: Conjunctivae normal.     Pupils: Pupils are equal, round, and reactive to light.  Neck:     Musculoskeletal: Normal range of motion and neck supple.     Thyroid: No thyromegaly.  Cardiovascular:     Rate and Rhythm: Regular rhythm. Tachycardia present.     Heart sounds: Normal heart sounds. No murmur. No friction rub. No gallop.   Pulmonary:     Effort: Pulmonary effort is normal. No respiratory distress.     Breath sounds: Normal breath sounds. No stridor. No wheezing or rales.  Abdominal:     General: Bowel sounds are normal. There is no distension.     Palpations: Abdomen is soft.     Tenderness: There is generalized abdominal tenderness. There is no right CVA tenderness, left CVA tenderness, guarding or rebound.  Lymphadenopathy:     Cervical: No cervical adenopathy.  Skin:    General: Skin is warm and dry.     Coloration: Skin is not pale.     Findings: No rash.  Neurological:     Mental Status: She is alert.     Coordination: Coordination normal.      ED Treatments / Results  Labs (all labs  ordered are listed, but only abnormal results are displayed) Labs Reviewed   COMPREHENSIVE METABOLIC PANEL - Abnormal; Notable for the following components:      Result Value   CO2 21 (*)    Glucose, Bld 148 (*)    All other components within normal limits  CBG MONITORING, ED - Abnormal; Notable for the following components:   Glucose-Capillary 143 (*)    All other components within normal limits  POCT I-STAT EG7 - Abnormal; Notable for the following components:   pCO2, Ven 41.0 (*)    pO2, Ven 46.0 (*)    All other components within normal limits  CBC WITH DIFFERENTIAL/PLATELET  LIPASE, BLOOD  I-STAT VENOUS BLOOD GAS, ED  I-STAT BETA HCG BLOOD, ED ([facility], [facility], [facility] ONLY)  TROPONIN I (HIGH SENSITIVITY)    EKG EKG Interpretation  Date/Time:  Friday December 03 2018 16:21:44 EST Ventricular Rate:  110 PR Interval:    QRS Duration: 83 QT Interval:  333 QTC Calculation: 451 R Axis:   73 Text Interpretation: Sinus tachycardia Confirmed by Madalyn Rob [phone]) on 12/03/2018 6:31:58 PM   Radiology No results found.  Procedures Procedures (including critical care time)  Medications Ordered in ED Medications  metoCLOPramide (REGLAN) injection 10 mg (10 mg Intravenous Given 12/03/18 1701)  sodium chloride 0.9 % bolus 1,000 mL (1,000 mLs Intravenous New Bag/Given 12/03/18 1700)     Initial Impression / Assessment and Plan / ED Course  I have reviewed the triage vital signs and the nursing notes.  Pertinent labs & imaging results that were available during my care of the patient were reviewed by me and considered in my medical decision making (see chart for details).        Patient presenting with ongoing nausea and vomiting as well as seizure-like activity.  Patient was just discharged from the hospital yesterday for both of these things.  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  Patient's labs are unremarkable today.  She is tolerating oral fluids after IV Reglan.  Patient has Phenergan suppository and Reglan at  home.  Advised progressive diet starting with clear liquids.  Close follow-up with PCP and neurology.  Patient will be discharged home.  There is an FYI in the chart that was added yesterday without documentation that patient has a legal guardian.  Patient denies this; question whether this was added by mistake since no supporting documentation.  Considering no documentation and patient was discharged home from her admission yesterday without documentation of legal guardian, will discharge home at this time.  Patient states she is ready to go home and feeling better.  Continue Phenergan and Reglan at home.  Return precautions discussed.  Patient understands and agrees with plan.  Patient vitals stable throughout ED course and discharged in satisfactory condition.  Patient also evaluated by my attending, Dr. Roslynn Amble, who guided the patient's management and agrees with plan.  Final Clinical Impressions(s) / ED Diagnoses   Final diagnoses:  Non-intractable vomiting with nausea, unspecified vomiting type    ED Discharge Orders    None       Frederica Kuster, Hershal Coria 12/03/18 2003    Lucrezia Starch, MD 12/04/18 1147

## 2018-12-03 NOTE — ED Triage Notes (Signed)
Pt here for continuing n/v. Seen for same yesterday. Pt sts she's having seizures every other day, had one this morning, sts she is compliant with Lamictal. Pt taking phergan, reglan, and bentyl at home without relief.

## 2018-12-03 NOTE — ED Notes (Signed)
Pt tolerating PO challenge without vomiting

## 2018-12-03 NOTE — Discharge Instructions (Signed)
Your labs were very reassuring today.  Continue taking your Reglan and Phenergan suppository as needed for nausea or vomiting.  Begin with clear liquids and you can progress your diet as tolerated.  Please follow-up with your PCP and neurologist next week.  Please return the emergency department if you develop any new or worsening symptoms.

## 2018-12-05 ENCOUNTER — Other Ambulatory Visit: Payer: Self-pay

## 2018-12-05 ENCOUNTER — Emergency Department (HOSPITAL_COMMUNITY)
Admission: EM | Admit: 2018-12-05 | Discharge: 2018-12-05 | Disposition: A | Discharge status: Home / Self Care | Payer: Medicaid Other | Attending: Emergency Medicine | Admitting: Emergency Medicine

## 2018-12-05 DIAGNOSIS — I110 Hypertensive heart disease with heart failure: Secondary | ICD-10-CM | POA: Insufficient documentation

## 2018-12-05 DIAGNOSIS — R112 Nausea with vomiting, unspecified: Secondary | ICD-10-CM | POA: Diagnosis present

## 2018-12-05 DIAGNOSIS — R251 Tremor, unspecified: Secondary | ICD-10-CM | POA: Diagnosis not present

## 2018-12-05 DIAGNOSIS — Z794 Long term (current) use of insulin: Secondary | ICD-10-CM | POA: Diagnosis not present

## 2018-12-05 DIAGNOSIS — E1143 Type 2 diabetes mellitus with diabetic autonomic (poly)neuropathy: Secondary | ICD-10-CM | POA: Diagnosis not present

## 2018-12-05 DIAGNOSIS — Z79899 Other long term (current) drug therapy: Secondary | ICD-10-CM | POA: Insufficient documentation

## 2018-12-05 DIAGNOSIS — K3184 Gastroparesis: Secondary | ICD-10-CM

## 2018-12-05 DIAGNOSIS — I509 Heart failure, unspecified: Secondary | ICD-10-CM | POA: Diagnosis not present

## 2018-12-05 LAB — CBC
HCT: 37.4 % (ref 36.0–46.0)
Hemoglobin: 12.1 g/dL (ref 12.0–15.0)
MCH: 27.1 pg (ref 26.0–34.0)
MCHC: 32.4 g/dL (ref 30.0–36.0)
MCV: 83.7 fL (ref 80.0–100.0)
Platelets: 326 10*3/uL (ref 150–400)
RBC: 4.47 MIL/uL (ref 3.87–5.11)
RDW: 13 % (ref 11.5–15.5)
WBC: 7.8 10*3/uL (ref 4.0–10.5)
nRBC: 0 % (ref 0.0–0.2)

## 2018-12-05 LAB — I-STAT BETA HCG BLOOD, ED (MC, WL, AP ONLY): I-stat hCG, quantitative: <5 m[IU]/mL (ref <5)

## 2018-12-05 LAB — COMPREHENSIVE METABOLIC PANEL
ALT: 17 U/L (ref 0–44)
AST: 16 U/L (ref 15–41)
Albumin: 4 g/dL (ref 3.5–5.0)
Alkaline Phosphatase: 72 U/L (ref 38–126)
Anion gap: 16 — ABNORMAL HIGH (ref 5–15)
BUN: 9 mg/dL (ref 6–20)
CO2: 21 mmol/L — ABNORMAL LOW (ref 22–32)
Calcium: 9.1 mg/dL (ref 8.9–10.3)
Chloride: 101 mmol/L (ref 98–111)
Creatinine, Ser: 0.66 mg/dL (ref 0.44–1.00)
GFR calc Af Amer: >60 mL/min (ref >60)
GFR calc non Af Amer: >60 mL/min (ref >60)
Glucose, Bld: 271 mg/dL — ABNORMAL HIGH (ref 70–99)
Potassium: 3.7 mmol/L (ref 3.5–5.1)
Sodium: 138 mmol/L (ref 135–145)
Total Bilirubin: 0.6 mg/dL (ref 0.3–1.2)
Total Protein: 6.9 g/dL (ref 6.5–8.1)

## 2018-12-05 LAB — LIPASE, BLOOD: Lipase: 37 U/L (ref 11–51)

## 2018-12-05 MED ORDER — ALUM & MAG HYDROXIDE-SIMETH 200-200-20 MG/5ML PO SUSP
30.0000 mL | Freq: Once | ORAL | Status: AC
  Administered 2018-12-05: 30 mL via ORAL
  Filled 2018-12-05: qty 30

## 2018-12-05 MED ORDER — DICYCLOMINE HCL 10 MG/5ML PO SOLN
10.0000 mg | Freq: Once | ORAL | Status: AC
  Administered 2018-12-05: 10 mg via ORAL
  Filled 2018-12-05: qty 5

## 2018-12-05 MED ORDER — PROMETHAZINE HCL 25 MG/ML IJ SOLN
25.0000 mg | Freq: Once | INTRAMUSCULAR | Status: AC
  Administered 2018-12-05: 08:00:00 25 mg via INTRAMUSCULAR
  Filled 2018-12-05: qty 1

## 2018-12-05 MED ORDER — ACETAMINOPHEN 500 MG PO TABS
1000.0000 mg | ORAL_TABLET | Freq: Once | ORAL | Status: AC
  Administered 2018-12-05: 11:00:00 1000 mg via ORAL
  Filled 2018-12-05: qty 2

## 2018-12-05 MED ORDER — FAMOTIDINE IN NACL 20-0.9 MG/50ML-% IV SOLN
20.0000 mg | Freq: Once | INTRAVENOUS | Status: AC
  Administered 2018-12-05: 08:00:00 20 mg via INTRAVENOUS
  Filled 2018-12-05: qty 50

## 2018-12-05 MED ORDER — KETOROLAC TROMETHAMINE 60 MG/2ML IM SOLN
15.0000 mg | Freq: Once | INTRAMUSCULAR | Status: AC
  Administered 2018-12-05: 15 mg via INTRAMUSCULAR
  Filled 2018-12-05: qty 2

## 2018-12-05 MED ORDER — SODIUM CHLORIDE 0.9% FLUSH
3.0000 mL | Freq: Once | INTRAVENOUS | Status: AC
  Administered 2018-12-05: 3 mL via INTRAVENOUS

## 2018-12-05 MED ORDER — SODIUM CHLORIDE 0.9 % IV BOLUS
1000.0000 mL | Freq: Once | INTRAVENOUS | Status: AC
  Administered 2018-12-05: 1000 mL via INTRAVENOUS

## 2018-12-05 MED ORDER — MORPHINE SULFATE (PF) 4 MG/ML IV SOLN
4.0000 mg | Freq: Once | INTRAVENOUS | Status: AC
  Administered 2018-12-05: 08:00:00 4 mg via INTRAVENOUS
  Filled 2018-12-05: qty 1

## 2018-12-05 MED ORDER — PROCHLORPERAZINE 25 MG RE SUPP: 25.0000 mg | Freq: Two times a day (BID) | RECTAL | 0 refills | Status: DC | PRN

## 2018-12-05 NOTE — ED Notes (Signed)
Patient calling mother to pick up. Discharge instructions reviewed with patient.

## 2018-12-05 NOTE — ED Provider Notes (Signed)
[facility] Provider Note   CSN: [id number] Arrival date & time: 12/05/18  [id number]     History   Chief Complaint Chief Complaint  Patient presents with  . Emesis  . Abdominal Pain    HPI Nancy [name] is a 18 y.o. female.     18 yo F with a chief complaints of diffuse abdominal pain nausea and vomiting.  States has been going on for a couple weeks.  Patient has a history of gastroparesis and she thinks feels the same.  No fevers.  No diarrhea.  She has been taking her medications without improvement.  States that she has tried her home Phenergan suppositories without improvement.  She also has been complaining of some generalized shaking that she refers to his seizures.  The history is provided by the patient.  Emesis Associated symptoms: abdominal pain   Associated symptoms: no arthralgias, no chills, no fever, no headaches and no myalgias   Abdominal Pain Associated symptoms: vomiting   Associated symptoms: no chest pain, no chills, no dysuria, no fever, no nausea and no shortness of breath   Illness Severity:  Moderate Onset quality:  Gradual Duration:  2 weeks Timing:  Constant Progression:  Worsening Chronicity:  New Associated symptoms: abdominal pain and vomiting   Associated symptoms: no chest pain, no congestion, no fever, no headaches, no myalgias, no nausea, no rhinorrhea, no shortness of breath and no wheezing     Past Medical History:  Diagnosis Date  . Acanthosis nigricans   . Anxiety   . CHF (congestive heart failure) (HCC)   . Chronic lower back pain   . Depression   . DKA, type 1 (HCC) 09/13/2018  . Dyspepsia   . Obesity   . Ovarian cyst    pt is not aware of this hx (11/24/2017)  . Pre-diabetes   . 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 Premature baby   . Seizures (HCC)   . Type II diabetes mellitus (HCC)    insulin dependant    Patient Active Problem List   Diagnosis Date Noted  . Pseudoseizures   .  Uncontrolled diabetes mellitus (HCC) 11/28/2018  . Uncontrolled diabetes mellitus with hyperglycemia (HCC) 11/27/2018  . 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  . MDD (major depressive disorder), recurrent episode, severe (HCC) 11/06/2018  . Nonspecific abnormal electrocardiogram (ECG) (EKG)   . Chest pain of uncertain etiology   . Hypertensive urgency 10/08/2018  . Conversion disorder with attacks or seizures, acute episode, with psychological stressor 09/16/2018  . MDD (major depressive disorder), recurrent severe, without psychosis (HCC)   . AKI (acute kidney injury) (HCC) 08/04/2018  . Syncope 01/30/2018  . Orthostatic hypotension 01/24/2018  . Chronic abdominal pain 12/24/2017  . Chest pain 12/19/2017  . Nausea and vomiting 08/21/2017  . Generalized abdominal pain 08/21/2017  . Non compliance with medical treatment 01/27/2012  . Adjustment disorder 09/16/2011  . Acanthosis nigricans   . Goiter   . Obesity 06/14/2010  . Hypertension 06/14/2010    Past Surgical History:  Procedure Laterality Date  . ABDOMINAL HERNIA REPAIR     "I was a baby"  . BIOPSY  10/12/2018   Procedure: BIOPSY;  Surgeon: Lynann BolognaGupta, Rajesh, MD;  [facility];  Service: Endoscopy;;  . ESOPHAGOGASTRODUODENOSCOPY (EGD) WITH PROPOFOL N/A 10/12/2018   Procedure: ESOPHAGOGASTRODUODENOSCOPY (EGD) WITH PROPOFOL;  Surgeon: Lynann BolognaGupta, Rajesh, MD;  [facility];  Service: Endoscopy;  Laterality: N/A;  . HERNIA REPAIR    .  LEFT HEART CATH AND CORONARY ANGIOGRAPHY N/A 10/13/2018   Procedure: LEFT HEART CATH AND CORONARY ANGIOGRAPHY;  Surgeon: Burnell Blanks, MD;  Location: [place] CV LAB;  Service: Cardiovascular;  Laterality: N/A;  . TONSILLECTOMY AND ADENOIDECTOMY    . WISDOM TOOTH EXTRACTION  2017     OB History   No obstetric history on file.      Home Medications    Prior to Admission medications   Medication Sig Start Date End Date Taking?  Authorizing Provider  atorvastatin (LIPITOR) 20 MG tablet Take 1 tablet (20 mg total) by mouth daily at 6 PM. 10/05/18   Elsie Stain, MD  busPIRone (BUSPAR) 10 MG tablet Take 10 mg by mouth 3 (three) times daily. 11/19/18   [provider]  dicyclomine (BENTYL) 10 MG capsule Take 10 mg by mouth 3 (three) times daily as needed (for abdominal pain).    [provider]  escitalopram (LEXAPRO) 20 MG tablet Take 20 mg by mouth daily.    [provider]  FLUoxetine (PROZAC) 40 MG capsule Take 40 mg by mouth daily.    [provider]  hydrOXYzine (ATARAX/VISTARIL) 50 MG tablet Take 1 tablet (50 mg total) by mouth 3 (three) times daily as needed for anxiety. Patient taking differently: Take 50 mg by mouth 3 (three) times daily.  11/11/18   Lindell Spar I, NP  insulin glargine (LANTUS) 100 UNIT/ML injection Inject 0.6 mLs (60 Units total) into the skin at bedtime. For diabetes management 11/11/18   Lindell Spar I, NP  insulin lispro (HUMALOG) 100 UNIT/ML injection Inject 0.15 mLs (15 Units total) into the skin 2 (two) times daily with a meal. 10/05/18   Elsie Stain, MD  lamoTRIgine (LAMICTAL) 25 MG tablet Take 2 tablets (50 mg total) by mouth 2 (two) times daily. For mood stabilization 11/11/18   Lindell Spar I, NP  lubiprostone (AMITIZA) 24 MCG capsule Take 1 capsule (24 mcg total) by mouth 2 (two) times daily with a meal. 10/27/18   Esterwood, Amy S, PA-C  meloxicam (MOBIC) 7.5 MG tablet Take 7.5 mg by mouth daily as needed for pain.    [provider]  metoCLOPramide (REGLAN) 10 MG tablet Take 1 tablet (10 mg total) by mouth 4 (four) times daily -  before meals and at bedtime. 11/30/18 12/30/18  Donne Hazel, MD  mirtazapine (REMERON) 7.5 MG tablet Take 1 tablet (7.5 mg total) by mouth at bedtime. For depression/sleep 11/11/18   Lindell Spar I, NP  Multiple Vitamin (MULTIVITAMIN WITH MINERALS) TABS tablet Take 1 tablet by mouth daily. 09/18/18   Clapacs,  Madie Reno, MD  Olopatadine HCl (PAZEO) 0.7 % SOLN Place 3 drops into both eyes 2 (two) times daily.    [provider]  pantoprazole (PROTONIX) 40 MG tablet Take 1 tablet (40 mg total) by mouth 2 (two) times daily. For acid reflux 11/11/18   Lindell Spar I, NP  prochlorperazine (COMPAZINE) 25 MG suppository Place 1 suppository (25 mg total) rectally every 12 (twelve) hours as needed for nausea or vomiting. 12/05/18   Deno Etienne, DO  promethazine (PHENERGAN) 25 MG suppository Place 25 mg rectally every 8 (eight) hours as needed for nausea or vomiting.    [provider]  propranolol (INDERAL) 20 MG tablet Take 1 tablet (20 mg total) by mouth 3 (three) times daily. For anxiety/HTN 11/11/18   Lindell Spar I, NP  QUEtiapine (SEROQUEL) 200 MG tablet Take 400 mg by mouth at bedtime.  [provider]  QUEtiapine (SEROQUEL) 25 MG tablet Take 1 tablet (25 mg total) by mouth 2 (two) times daily. For agitation 11/11/18   Armandina Stammer I, NP  RESTASIS 0.05 % ophthalmic emulsion Place 1 drop into both eyes 2 (two) times daily. 11/16/18   [provider]  topiramate (TOPAMAX) 25 MG tablet Take 1 tablet (25 mg total) by mouth 2 (two) times daily. For seizure activities 11/11/18   Sanjuana Kava, NP    Family History Family History  Problem Relation Age of Onset  . Diabetes Mother   . Hypertension Mother   . Obesity Mother   . Asthma Mother   . Allergic rhinitis Mother   . Eczema Mother   . Cervical cancer Mother   . Diabetes Father   . Hypertension Father   . Obesity Father   . Hyperlipidemia Father   . Hypertension Paternal Aunt   . Hypertension Maternal Grandfather   . Colon cancer Maternal Grandfather   . Diabetes Paternal Grandmother   . Obesity Paternal Grandmother   . Diabetes Paternal Grandfather   . Obesity Paternal Grandfather   . Angioedema Neg Hx   . Immunodeficiency Neg Hx   . Urticaria Neg Hx   . Stomach cancer Neg Hx   . Esophageal cancer Neg Hx      Social History Social History   Tobacco Use  . Smoking status: Never Smoker  . Smokeless tobacco: Never Used  Substance Use Topics  . Alcohol use: No    Alcohol/week: 0.0 standard drinks  . Drug use: No     Allergies   Ibuprofen and Oatmeal   Review of Systems Review of Systems  Constitutional: Negative for chills and fever.  HENT: Negative for congestion and rhinorrhea.   Eyes: Negative for redness and visual disturbance.  Respiratory: Negative for shortness of breath and wheezing.   Cardiovascular: Negative for chest pain and palpitations.  Gastrointestinal: Positive for abdominal pain and vomiting. Negative for nausea.  Genitourinary: Negative for dysuria and urgency.  Musculoskeletal: Negative for arthralgias and myalgias.  Skin: Negative for pallor and wound.  Neurological: Negative for dizziness and headaches.     Physical Exam Updated Vital Signs BP 130/86   Pulse 94   Temp 98.6 F (37 C) (Oral)   Resp (!) 22   SpO2 99%   Physical Exam Vitals signs and nursing note reviewed.  Constitutional:      General: She is not in acute distress.    Appearance: She is well-developed. She is not diaphoretic.  HENT:     Head: Normocephalic and atraumatic.  Eyes:     Pupils: Pupils are equal, round, and reactive to light.  Neck:     Musculoskeletal: Normal range of motion and neck supple.  Cardiovascular:     Rate and Rhythm: Normal rate and regular rhythm.     Heart sounds: No murmur. No friction rub. No gallop.   Pulmonary:     Effort: Pulmonary effort is normal.     Breath sounds: No wheezing or rales.  Abdominal:     General: There is no distension.     Palpations: Abdomen is soft.     Tenderness: There is no abdominal tenderness.  Musculoskeletal:        General: No tenderness.  Skin:    General: Skin is warm and dry.  Neurological:     Mental Status: She is alert and oriented to person, place, and time.  Psychiatric:  Behavior: Behavior  normal.      ED Treatments / Results  Labs (all labs ordered are listed, but only abnormal results are displayed) Labs Reviewed  COMPREHENSIVE METABOLIC PANEL - Abnormal; Notable for the following components:      Result Value   CO2 21 (*)    Glucose, Bld 271 (*)    Anion gap 16 (*)    All other components within normal limits  LIPASE, BLOOD  CBC  URINALYSIS, ROUTINE W REFLEX MICROSCOPIC  I-STAT BETA HCG BLOOD, ED ([facility], [facility], [facility] ONLY)    EKG EKG Interpretation  Date/Time:  Sunday December 05 2018 07:41:46 EST Ventricular Rate:  116 PR Interval:    QRS Duration: 83 QT Interval:  355 QTC Calculation: 494 R Axis:   63 Text Interpretation: Sinus tachycardia Borderline prolonged QT interval No significant change since last tracing Confirmed by ,  ([id number]) on 12/05/2018 8:08:28 AM   Radiology No results found.  Procedures Procedures (including critical care time)  Medications Ordered in ED Medications  ketorolac (TORADOL) injection 15 mg (has no administration in time range)  acetaminophen (TYLENOL) tablet 1,000 mg (has no administration in time range)  sodium chloride flush (NS) 0.9 % injection 3 mL (3 mLs Intravenous Given 12/05/18 0800)  promethazine (PHENERGAN) injection 25 mg (25 mg Intramuscular Given 12/05/18 0800)  sodium chloride 0.9 % bolus 1,000 mL (1,000 mLs Intravenous New Bag/Given 12/05/18 0800)  famotidine (PEPCID) IVPB 20 mg premix (0 mg Intravenous Stopped 12/05/18 0835)  alum & mag hydroxide-simeth (MAALOX/MYLANTA) 200-200-20 MG/5ML suspension 30 mL (30 mLs Oral Given 12/05/18 0905)  dicyclomine (BENTYL) 10 MG/5ML solution 10 mg (10 mg Oral Given 12/05/18 0905)  morphine 4 MG/ML injection 4 mg (4 mg Intravenous Given 12/05/18 0802)     Initial Impression / Assessment and Plan / ED Course  I have reviewed the triage vital signs and the nursing notes.  Pertinent labs & imaging results that were available during my care of the patient were  reviewed by me and considered in my medical decision making (see chart for details).        18  yo F with a chief complaints of nausea and vomiting.  This is a recurrent problem for her.  Thought to be due to her gastroparesis from poorly controlled diabetes.  Patient has been hospitalized twice within the last week for the same.  The patient was commenting that she was having some seizures though she told me she was having them as she was actively shaking in the room and talking with me.  Seems more psychogenic.  Has a documented hospitalization with the same.  She is well-appearing and nontoxic does not clinically appear to be dehydrated.  Her lab work is consistent with her prior.  She has no LFT elevation lipase is normal.  She is tolerating p.o. for me here.  We will discharge her home.  PCP follow-up.  9:57 AM:  I have discussed the diagnosis/risks/treatment options with the patient and believe the pt to be eligible for discharge home to follow-up with PCP. We also discussed returning to the ED immediately if new or worsening sx occur. We discussed the sx which are most concerning (e.g., sudden worsening pain, fever, inability to tolerate by mouth) that necessitate immediate return. Medications administered to the patient during their visit and any new prescriptions provided to the patient are listed below.  Medications given during this visit Medications  ketorolac (TORADOL) injection 15 mg (has no administration in time range)  acetaminophen (TYLENOL) tablet 1,000 mg (has no administration in time range)  sodium chloride flush (NS) 0.9 % injection 3 mL (3 mLs Intravenous Given 12/05/18 0800)  promethazine (PHENERGAN) injection 25 mg (25 mg Intramuscular Given 12/05/18 0800)  sodium chloride 0.9 % bolus 1,000 mL (1,000 mLs Intravenous New Bag/Given 12/05/18 0800)  famotidine (PEPCID) IVPB 20 mg premix (0 mg Intravenous Stopped 12/05/18 0835)  alum & mag hydroxide-simeth (MAALOX/MYLANTA)  200-200-20 MG/5ML suspension 30 mL (30 mLs Oral Given 12/05/18 0905)  dicyclomine (BENTYL) 10 MG/5ML solution 10 mg (10 mg Oral Given 12/05/18 0905)  morphine 4 MG/ML injection 4 mg (4 mg Intravenous Given 12/05/18 0802)     The patient appears reasonably screen and/or stabilized for discharge and I doubt any other medical condition or other [facility] requiring further screening, evaluation, or treatment in the ED at this time prior to discharge.    Final Clinical Impressions(s) / ED Diagnoses   Final diagnoses:  Gastroparesis    ED Discharge Orders         Ordered    prochlorperazine (COMPAZINE) 25 MG suppository  Every 12 hours PRN     12/05/18 0951           Melene PlanFloyd, Myrick Mcnairy, DO 12/05/18 0957

## 2018-12-05 NOTE — ED Triage Notes (Signed)
Per pt she has been having abdominal pain with emesis that has been going on for 2 weeks. Pt said she was here yesterday with same symptoms and was given fluids and reglan. Nothing is working. Pt is still vomiting.

## 2018-12-05 NOTE — Discharge Instructions (Signed)
Follow up with your family doctor in the office.  Eat small meals and consume liquids in sips.  Return for worsening symptoms.    Try to avoid things that may make this worse, most commonly these are spicy foods tomato based products fatty foods chocolate and peppermint.  Alcohol and tobacco can also make this worse.  Return to the emergency department for sudden worsening pain fever or inability to eat or drink.

## 2018-12-06 ENCOUNTER — Ambulatory Visit: Payer: Medicaid Other | Attending: Nurse Practitioner | Admitting: Nurse Practitioner

## 2018-12-06 ENCOUNTER — Encounter: Payer: Self-pay | Admitting: Nurse Practitioner

## 2018-12-06 ENCOUNTER — Other Ambulatory Visit: Payer: Self-pay

## 2018-12-06 DIAGNOSIS — E1165 Type 2 diabetes mellitus with hyperglycemia: Secondary | ICD-10-CM | POA: Diagnosis not present

## 2018-12-06 DIAGNOSIS — F418 Other specified anxiety disorders: Secondary | ICD-10-CM

## 2018-12-06 DIAGNOSIS — IMO0002 Reserved for concepts with insufficient information to code with codable children: Secondary | ICD-10-CM

## 2018-12-06 DIAGNOSIS — Z794 Long term (current) use of insulin: Secondary | ICD-10-CM | POA: Diagnosis not present

## 2018-12-06 NOTE — Progress Notes (Signed)
Virtual Visit via Telephone Note Due to national recommendations of social distancing due to COVID 19, telehealth visit is felt to be most appropriate for this patient at this time.  I discussed the limitations, risks, security and privacy concerns of performing an evaluation and management service by telephone and the availability of in person appointments. I also discussed with the patient that there may be a patient responsible charge related to this service. The patient expressed understanding and agreed to proceed.    I connected with Nancy [name] on 12/08/18  at   2:10 PM EST  EDT by telephone and verified that I am speaking with the correct person using two identifiers.   Consent I discussed the limitations, risks, security and privacy concerns of performing an evaluation and management service by telephone and the availability of in person appointments. I also discussed with the patient that there may be a patient responsible charge related to this service. The patient expressed understanding and agreed to proceed.   Location of Patient: Private Residence   Location of Provider: [facility]    Persons participating in Telemedicine visit: Bertram Denver FNP-BC [name] CMA Nancy [name]    History of Present Illness: Telemedicine visit for: Follow up  has a past medical history of Acanthosis nigricans, Anxiety, CHF (congestive heart failure) (HCC), Chronic lower back pain, Depression, DKA, type 1 (HCC) (09/13/2018), Dyspepsia, Obesity, Ovarian cyst, Pre-diabetes, Precocious adrenarche (HCC), Premature baby, Seizures (HCC), and Type II diabetes mellitus (HCC).   DM TYPE 2 Nancy [name] has poorly controlled Type 2 diabetes due to noncompliance with medications, inactivity and diet.  Blood Glucose levels running high at home: Postprandial 250s. Fasting 170-180s. She has been to a nutrition class in the past.  I had referred her to Endocrinology at  [facility] however she missed that appointment as she was being seen in the ED for gastroparesis. She was dismissed from Dr. George Hugh office due to non adherence. A1c continues to be above goal at every office visit.  She is aware of the complications from diabetes that can occur including: blindness, renal failure, stroke, heart attack and even death. However she continues to return to the office with elevated blood glucose levels usually requiring Novolog in the office. Not making much progress with her health as she does not appear to be fully invested in making the necessary changes.  Current medications include lantus 60 units at bedtime and humalog 15 units BID. Taking atorvastatin 20mg  daily and LDL at goal.  Lab Results  Component Value Date   HGBA1C 10.2 (H) 11/07/2018   Lab Results  Component Value Date   LDLCALC 26 11/07/2018   Anxiety and Depression Seeing a therapist and psychiatrist twice a month through [facility].  Current medications include lamictal 50 mg BID, lexapro 20 mg (dosage has not been verified) prozac 40 mg reported by patient, buspar 10 Mg TID, hydroxyzine 50 mg TID, seroquel 400 mg at bedtime, propranolol 20 mg TID,  and remeron 7.5 mg daily prn. Several of these medications were relayed by the patient however unable to verify from a pharmacy for any recent fills.       Past Medical History:  Diagnosis Date  . Acanthosis nigricans   . Anxiety   . CHF (congestive heart failure) (HCC)   . Chronic lower back pain   . Depression   . DKA, type 1 (HCC) 09/13/2018  . Dyspepsia   . Obesity   . Ovarian cyst  pt is not aware of this hx (11/24/2017)  . Pre-diabetes   . 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 Premature baby   . Seizures (HCC)   . Type II diabetes mellitus (HCC)    insulin dependant    Past Surgical History:  Procedure Laterality Date  . ABDOMINAL HERNIA REPAIR     "I was a baby"  . BIOPSY  10/12/2018   Procedure: BIOPSY;  Surgeon: Lynann Bologna, MD;   Location: [facility] ENDOSCOPY;  Service: Endoscopy;;  . ESOPHAGOGASTRODUODENOSCOPY (EGD) WITH PROPOFOL N/A 10/12/2018   Procedure: ESOPHAGOGASTRODUODENOSCOPY (EGD) WITH PROPOFOL;  Surgeon: Lynann Bologna, MD;  Location: [facility] ENDOSCOPY;  Service: Endoscopy;  Laterality: N/A;  . HERNIA REPAIR    . LEFT HEART CATH AND CORONARY ANGIOGRAPHY N/A 10/13/2018   Procedure: LEFT HEART CATH AND CORONARY ANGIOGRAPHY;  Surgeon: Kathleene Hazel, MD;  [facility] IN[facility] LAB;  Service: Cardiovascular;  Laterality: N/A;  . TONSILLECTOMY AND ADENOIDECTOMY    . WISDOM TOOTH EXTRACTION  2017    Family History  Problem Relation Age of Onset  . Diabetes Mother   . Hypertension Mother   . Obesity Mother   . Asthma Mother   . Allergic rhinitis Mother   . Eczema Mother   . Cervical cancer Mother   . Diabetes Father   . Hypertension Father   . Obesity Father   . Hyperlipidemia Father   . Hypertension Paternal Aunt   . Hypertension Maternal Grandfather   . Colon cancer Maternal Grandfather   . Diabetes Paternal Grandmother   . Obesity Paternal Grandmother   . Diabetes Paternal Grandfather   . Obesity Paternal Grandfather   . Angioedema Neg Hx   . Immunodeficiency Neg Hx   . Urticaria Neg Hx   . Stomach cancer Neg Hx   . Esophageal cancer Neg Hx     Social History   Socioeconomic History  . Marital status: Single    Spouse name: Not on file  . Number of children: 0  . Years of education: Not on file  . Highest education level: Not on file  Occupational History  . Occupation: Admission  Social Needs  . Financial resource strain: Not on file  . Food insecurity    Worry: Not on file    Inability: Not on file  . Transportation needs    Medical: Not on file    Non-medical: Not on file  Tobacco Use  . Smoking status: Never Smoker  . Smokeless tobacco: Never Used  Substance and Sexual Activity  . Alcohol use: No    Alcohol/week: 0.0 standard drinks  . Drug use: No  . Sexual activity: Never   Lifestyle  . Physical activity    Days per week: Not on file    Minutes per session: Not on file  . Stress: Not on file  Relationships  . Social Musician on phone: Not on file    Gets together: Not on file    Attends religious service: Not on file    Active member of club or organization: Not on file    Attends meetings of clubs or organizations: Not on file    Relationship status: Not on file  Other Topics Concern  . Not on file  Social History Narrative   Lives with mom and mom's girlfriend.   No EtOH, tobacco, Drugs     Observations/Objective: Awake, alert and oriented x 3   Review of Systems  Constitutional: Negative for fever, malaise/fatigue and  weight loss.  HENT: Negative.  Negative for nosebleeds.   Eyes: Negative.  Negative for blurred vision, double vision and photophobia.  Respiratory: Negative.  Negative for cough and shortness of breath.   Cardiovascular: Negative.  Negative for chest pain, palpitations and leg swelling.  Gastrointestinal: Positive for abdominal pain (gastroparesis; chronic) and heartburn. Negative for nausea and vomiting.  Musculoskeletal: Positive for back pain. Negative for myalgias.  Neurological: Positive for headaches. Negative for dizziness, focal weakness and seizures (history; no recent episodes).  Psychiatric/Behavioral: Positive for depression. Negative for suicidal ideas. The patient is nervous/anxious.     Assessment and Plan: Laurrie was seen today for follow-up.  Diagnoses and all orders for this visit:  Insulin dependent type 2 diabetes mellitus, uncontrolled ([place]) Long discussion today regarding diabetes management. Increasing activity and working on diet and medication adherence.  Diabetes is poorly controlled. Advised patient to keep a fasting blood sugar log fast, 2 hours post lunch and bedtime which will be reviewed at the next office visit.  Continue blood sugar control as discussed in office today, low  carbohydrate diet, and regular physical exercise as tolerated, 150 minutes per week (30 min each day, 5 days per week, or 50 min 3 days per week). Keep blood sugar logs with fasting goal of 90-130 mg/dl, post prandial (after you eat) less than 180.  For Hypoglycemia: BS <60 and Hyperglycemia BS >400; contact the clinic ASAP. Annual eye exams and foot exams are recommended.   Depression with anxiety Continue [facility] f/u Bring all meds to next office visit for review   Denies any current thoughts of self harm  Follow Up Instructions Return in about 2 months (around 02/05/2019).     I discussed the assessment and treatment plan with the patient. The patient was provided an opportunity to ask questions and all were answered. The patient agreed with the plan and demonstrated an understanding of the instructions.   The patient was advised to call back or seek an in-person evaluation if the symptoms worsen or if the condition fails to improve as anticipated.  I provided 24 minutes of non-face-to-face time during this encounter including median intraservice time, reviewing previous notes, labs, imaging, medications and explaining diagnosis and management.  [name], FNP-BC

## 2018-12-08 ENCOUNTER — Encounter: Payer: Self-pay | Admitting: Nurse Practitioner

## 2018-12-10 ENCOUNTER — Emergency Department (HOSPITAL_COMMUNITY)
Admission: EM | Admit: 2018-12-10 | Discharge: 2018-12-11 | Disposition: A | Discharge status: Home / Self Care | Payer: Medicaid Other | Attending: Emergency Medicine | Admitting: Emergency Medicine

## 2018-12-10 ENCOUNTER — Other Ambulatory Visit: Payer: Self-pay

## 2018-12-10 DIAGNOSIS — I509 Heart failure, unspecified: Secondary | ICD-10-CM | POA: Diagnosis not present

## 2018-12-10 DIAGNOSIS — R569 Unspecified convulsions: Secondary | ICD-10-CM | POA: Insufficient documentation

## 2018-12-10 DIAGNOSIS — Z79899 Other long term (current) drug therapy: Secondary | ICD-10-CM | POA: Insufficient documentation

## 2018-12-10 DIAGNOSIS — R112 Nausea with vomiting, unspecified: Secondary | ICD-10-CM | POA: Insufficient documentation

## 2018-12-10 DIAGNOSIS — G40909 Epilepsy, unspecified, not intractable, without status epilepticus: Secondary | ICD-10-CM

## 2018-12-10 DIAGNOSIS — E109 Type 1 diabetes mellitus without complications: Secondary | ICD-10-CM | POA: Diagnosis not present

## 2018-12-10 DIAGNOSIS — I110 Hypertensive heart disease with heart failure: Secondary | ICD-10-CM | POA: Insufficient documentation

## 2018-12-10 LAB — CBC
HCT: 39.2 % (ref 36.0–46.0)
Hemoglobin: 12.5 g/dL (ref 12.0–15.0)
MCH: 27.6 pg (ref 26.0–34.0)
MCHC: 31.9 g/dL (ref 30.0–36.0)
MCV: 86.5 fL (ref 80.0–100.0)
Platelets: 397 10*3/uL (ref 150–400)
RBC: 4.53 MIL/uL (ref 3.87–5.11)
RDW: 13.6 % (ref 11.5–15.5)
WBC: 8.7 10*3/uL (ref 4.0–10.5)
nRBC: 0 % (ref 0.0–0.2)

## 2018-12-10 LAB — LIPASE, BLOOD: Lipase: 29 U/L (ref 11–51)

## 2018-12-10 LAB — COMPREHENSIVE METABOLIC PANEL
ALT: 19 U/L (ref 0–44)
AST: 18 U/L (ref 15–41)
Albumin: 3.7 g/dL (ref 3.5–5.0)
Alkaline Phosphatase: 72 U/L (ref 38–126)
Anion gap: 11 (ref 5–15)
BUN: 9 mg/dL (ref 6–20)
CO2: 26 mmol/L (ref 22–32)
Calcium: 9.2 mg/dL (ref 8.9–10.3)
Chloride: 96 mmol/L — ABNORMAL LOW (ref 98–111)
Creatinine, Ser: 0.88 mg/dL (ref 0.44–1.00)
GFR calc Af Amer: >60 mL/min (ref >60)
GFR calc non Af Amer: >60 mL/min (ref >60)
Glucose, Bld: 435 mg/dL — ABNORMAL HIGH (ref 70–99)
Potassium: 4.4 mmol/L (ref 3.5–5.1)
Sodium: 133 mmol/L — ABNORMAL LOW (ref 135–145)
Total Bilirubin: 0.4 mg/dL (ref 0.3–1.2)
Total Protein: 6.4 g/dL — ABNORMAL LOW (ref 6.5–8.1)

## 2018-12-10 LAB — I-STAT BETA HCG BLOOD, ED (MC, WL, AP ONLY): I-stat hCG, quantitative: <5 m[IU]/mL (ref <5)

## 2018-12-10 LAB — CBG MONITORING, ED: Glucose-Capillary: 436 mg/dL — ABNORMAL HIGH (ref 70–99)

## 2018-12-10 MED ORDER — ONDANSETRON HCL 4 MG PO TABS
4.0000 mg | ORAL_TABLET | Freq: Once | ORAL | Status: AC
  Administered 2018-12-10: 4 mg via ORAL
  Filled 2018-12-10: qty 1

## 2018-12-10 MED ORDER — DROPERIDOL 2.5 MG/ML IJ SOLN
1.2500 mg | Freq: Once | INTRAMUSCULAR | Status: AC
  Administered 2018-12-11: 1.25 mg via INTRAVENOUS
  Filled 2018-12-10: qty 2

## 2018-12-10 MED ORDER — ONDANSETRON HCL 4 MG/2ML IJ SOLN
4.0000 mg | Freq: Once | INTRAMUSCULAR | Status: AC
  Administered 2018-12-10: 4 mg via INTRAVENOUS
  Filled 2018-12-10: qty 2

## 2018-12-10 MED ORDER — LAMOTRIGINE 25 MG PO TABS
25.0000 mg | ORAL_TABLET | Freq: Once | ORAL | Status: AC
  Administered 2018-12-11: 25 mg via ORAL
  Filled 2018-12-10: qty 1

## 2018-12-10 MED ORDER — LACTATED RINGERS IV BOLUS
1000.0000 mL | Freq: Once | INTRAVENOUS | Status: AC
  Administered 2018-12-11: 1000 mL via INTRAVENOUS

## 2018-12-10 MED ORDER — LEVETIRACETAM IN NACL 1000 MG/100ML IV SOLN
1000.0000 mg | Freq: Once | INTRAVENOUS | Status: AC
  Administered 2018-12-11: 1000 mg via INTRAVENOUS
  Filled 2018-12-10: qty 100

## 2018-12-10 NOTE — ED Triage Notes (Signed)
Pt here via EMS, per mother pt has had six seizures today. Also would like evaluation of vomiting blood x 4 days. Pt hyperglycemic to 500s, pt sts she is compliant with insulin, although did not take any this morning.

## 2018-12-11 MED ORDER — LACTATED RINGERS IV BOLUS: 1000.0000 mL | Freq: Once | INTRAVENOUS | Status: DC

## 2018-12-11 MED ORDER — INSULIN ASPART 100 UNIT/ML ~~LOC~~ SOLN
10.0000 [IU] | Freq: Once | SUBCUTANEOUS | Status: AC
  Administered 2018-12-11: 10 [IU] via INTRAVENOUS

## 2018-12-11 MED ORDER — HALOPERIDOL LACTATE 5 MG/ML IJ SOLN
2.0000 mg | Freq: Once | INTRAMUSCULAR | Status: AC
  Administered 2018-12-11: 2 mg via INTRAVENOUS
  Filled 2018-12-11: qty 1

## 2018-12-11 NOTE — ED Provider Notes (Signed)
Emergency Department Provider Note   I have reviewed the triage vital signs and the nursing notes.   HISTORY  Chief Complaint Seizures and Hematemesis   HPI Nancy [name] is a 18 y.o. female who presents to the emergency department today with vomiting and seizure-like activity at home.  Patient's been seen multiple times in the last few months for same symptoms.  She has had extensive work-ups without etiologies found.  She states that today she been vomiting for the last few days sometimes with blood in it.  But not a large amount of blood.  Today she had 6 seizures per her account.  She states that somehow she is female keep her seizure medicine down even though she cannot keep anything else down.  Her blood sugars been high but she states she is taking her insulin. No fevers, abd pain, chest pain, headache or other associated complaints.    No other associated or modifying symptoms.    Past Medical History:  Diagnosis Date  . Acanthosis nigricans   . Anxiety   . CHF (congestive heart failure) (HCC)   . Chronic lower back pain   . Depression   . DKA, type 1 (HCC) 09/13/2018  . Dyspepsia   . Obesity   . Ovarian cyst    pt is not aware of this hx (11/24/2017)  . Pre-diabetes   . 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 Premature baby   . Seizures (HCC)   . Type II diabetes mellitus (HCC)    insulin dependant    Patient Active Problem List   Diagnosis Date Noted  . Pseudoseizures   . Uncontrolled diabetes mellitus (HCC) 11/28/2018  . Uncontrolled diabetes mellitus with hyperglycemia (HCC) 11/27/2018  . 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  . MDD (major depressive disorder), recurrent episode, severe (HCC) 11/06/2018  . Nonspecific abnormal electrocardiogram (ECG) (EKG)   . Chest pain of uncertain etiology   . Hypertensive urgency 10/08/2018  . Conversion disorder with attacks or seizures, acute episode, with psychological stressor  09/16/2018  . MDD (major depressive disorder), recurrent severe, without psychosis (HCC)   . AKI (acute kidney injury) (HCC) 08/04/2018  . Syncope 01/30/2018  . Orthostatic hypotension 01/24/2018  . Chronic abdominal pain 12/24/2017  . Chest pain 12/19/2017  . Nausea and vomiting 08/21/2017  . Generalized abdominal pain 08/21/2017  . Non compliance with medical treatment 01/27/2012  . Adjustment disorder 09/16/2011  . Acanthosis nigricans   . Goiter   . Obesity 06/14/2010  . Hypertension 06/14/2010    Past Surgical History:  Procedure Laterality Date  . ABDOMINAL HERNIA REPAIR     "I was a baby"  . BIOPSY  10/12/2018   Procedure: BIOPSY;  Surgeon: Lynann Bologna, MD;  Location: [facility] ENDOSCOPY;  Service: Endoscopy;;  . ESOPHAGOGASTRODUODENOSCOPY (EGD) WITH PROPOFOL N/A 10/12/2018   Procedure: ESOPHAGOGASTRODUODENOSCOPY (EGD) WITH PROPOFOL;  Surgeon: Lynann Bologna, MD;  Location: [facility] ENDOSCOPY;  Service: Endoscopy;  Laterality: N/A;  . HERNIA REPAIR    . LEFT HEART CATH AND CORONARY ANGIOGRAPHY N/A 10/13/2018   Procedure: LEFT HEART CATH AND CORONARY ANGIOGRAPHY;  Surgeon: Kathleene Hazel, MD;  Location: [facility] INVASIVE CV LAB;  Service: Cardiovascular;  Laterality: N/A;  . TONSILLECTOMY AND ADENOIDECTOMY    . WISDOM TOOTH EXTRACTION  2017    Current Outpatient Rx  . Order #: [id number] Class: Normal  . Order #: [id number] Class: Historical Med  . Order #: [id number] Class: Historical Med  . Order #: [id number] Class: Historical Med  .  Order #: [id number] Class: Historical Med  . Order #: [id number] Class: Print  . Order #: [id number] Class: Print  . Order #: [id number] Class: Normal  . Order #: [id number] Class: Print  . Order #: [id number] Class: Normal  . Order #: [id number] Class: Historical Med  . Order #: [id number] Class: Normal  . Order #: [id number] Class: Print  . Order #: [id number] Class: Print  . Order #: [id number] Class: Historical Med  . Order #: [id number] Class: Print  . Order #:  [id number] Class: Normal  . Order #: [id number] Class: Historical Med  . Order #: [id number] Class: Print  . Order #: [id number] Class: Historical Med  . Order #: [id number] Class: Historical Med  . Order #: [id number] Class: Print    Allergies Ibuprofen and Oatmeal  Family History  Problem Relation Age of Onset  . Diabetes Mother   . Hypertension Mother   . Obesity Mother   . Asthma Mother   . Allergic rhinitis Mother   . Eczema Mother   . Cervical cancer Mother   . Diabetes Father   . Hypertension Father   . Obesity Father   . Hyperlipidemia Father   . Hypertension Paternal Aunt   . Hypertension Maternal Grandfather   . Colon cancer Maternal Grandfather   . Diabetes Paternal Grandmother   . Obesity Paternal Grandmother   . Diabetes Paternal Grandfather   . Obesity Paternal Grandfather   . Angioedema Neg Hx   . Immunodeficiency Neg Hx   . Urticaria Neg Hx   . Stomach cancer Neg Hx   . Esophageal cancer Neg Hx     Social History Social History   Tobacco Use  . Smoking status: Never Smoker  . Smokeless tobacco: Never Used  Substance Use Topics  . Alcohol use: No    Alcohol/week: 0.0 standard drinks  . Drug use: No    Review of Systems  All other systems negative except as documented in the HPI. All pertinent positives and negatives as reviewed in the HPI. ____________________________________________   PHYSICAL EXAM:  VITAL SIGNS: ED Triage Vitals  Enc Vitals Group     BP 12/10/18 1239 127/72     Pulse Rate 12/10/18 1239 (!) 110     Resp 12/10/18 1239 18     Temp 12/10/18 1239 98.9 F (37.2 C)     Temp Source 12/10/18 1239 Oral     SpO2 12/10/18 1239 100 %   Constitutional: Alert and oriented. Well appearing and in no acute distress. Eyes: Conjunctivae are normal. PERRL. EOMI. Head: Atraumatic. Nose: No congestion/rhinnorhea. Mouth/Throat: Mucous membranes are moist.  Oropharynx non-erythematous. Neck: No stridor.  No meningeal signs.   Cardiovascular:  tachycardic rate, regular rhythm. Good peripheral circulation. Grossly normal heart sounds.   Respiratory: Normal respiratory effort.  No retractions. Lungs CTAB. Gastrointestinal: Soft and nontender. No distention.  Musculoskeletal: No lower extremity tenderness nor edema. No gross deformities of extremities. Neurologic:  Normal speech and language. No gross focal neurologic deficits are appreciated.  Skin:  Skin is warm, dry and intact. No rash noted.   ____________________________________________   LABS (all labs ordered are listed, but only abnormal results are displayed)  Labs Reviewed  COMPREHENSIVE METABOLIC PANEL - Abnormal; Notable for the following components:      Result Value   Sodium 133 (*)    Chloride 96 (*)    Glucose, Bld 435 (*)    Total Protein 6.4 (*)    All other components within normal limits  CBG MONITORING, ED - Abnormal; Notable for the following components:   Glucose-Capillary 436 (*)  All other components within normal limits  LIPASE, BLOOD  CBC  URINALYSIS, ROUTINE W REFLEX MICROSCOPIC  LEVETIRACETAM LEVEL  LAMOTRIGINE LEVEL  I-STAT BETA HCG BLOOD, ED ([facility], [facility], [facility] ONLY)   ____________________________________________  EKG  My ECG Read Indication:evaluate QT EKG was personally contemporaneously reviewed by myself. Rate: 103 PR Interval: 126 QRS duration: 105 QT/QTC: 342/448 Axis: normal EKG: unchanged from previous tracings, sinus tachycardia. Other significant findings: none   ____________________________________________  INITIAL IMPRESSION / ASSESSMENT AND PLAN / ED COURSE  Patient ultimately able to eat and drink without difficulty.  Labs are unremarkable.  Her heart rate had improved significantly prior to when she first got here.  Of note it does appear at the time of writing this note that her discharge heart rate is documented 124 however the last time I saw it was in the high 90s low 100s.  This may be related to still be a little  bit hypovolemic but patient is tolerating p.o. and should build the drink fluids and eat food and take her seizure medication at home.  Outpatient follow-up warranted.  Pertinent labs & imaging results that were available during my care of the patient were reviewed by me and considered in my medical decision making (see chart for details).  A medical screening exam was performed and I feel the patient has had an appropriate workup for their chief complaint at this time and likelihood of emergent condition existing is low. They have been counseled on decision, discharge, follow up and which symptoms necessitate immediate return to the emergency department. They or their family verbally stated understanding and agreement with plan and discharged in stable condition.   ____________________________________________  FINAL CLINICAL IMPRESSION(S) / ED DIAGNOSES  Final diagnoses:  Seizure disorder ([place])  Nausea and vomiting, intractability of vomiting not specified, unspecified vomiting type     MEDICATIONS GIVEN DURING THIS VISIT:  Medications  ondansetron (ZOFRAN) injection 4 mg (4 mg Intravenous Given 12/10/18 1541)  ondansetron (ZOFRAN) tablet 4 mg (4 mg Oral Given 12/10/18 2251)  lactated ringers bolus 1,000 mL (0 mLs Intravenous Stopped 12/11/18 0348)  levETIRAcetam (KEPPRA) IVPB 1000 mg/100 mL premix (0 mg Intravenous Stopped 12/11/18 0026)  lamoTRIgine (LAMICTAL) tablet 25 mg (25 mg Oral Given 12/11/18 0247)  droperidol (INAPSINE) 2.5 MG/ML injection 1.25 mg (1.25 mg Intravenous Given 12/11/18 0024)  insulin aspart (novoLOG) injection 10 Units (10 Units Intravenous Given 12/11/18 0242)  haloperidol lactate (HALDOL) injection 2 mg (2 mg Intravenous Given 12/11/18 0243)     NEW OUTPATIENT MEDICATIONS STARTED DURING THIS VISIT:  Discharge Medication List as of 12/11/2018  3:49 AM      Note:  This note was prepared with assistance of Dragon voice recognition software. Occasional wrong-word or  sound-a-like substitutions may have occurred due to the inherent limitations of voice recognition software.   Drevin Ortner, Corene Cornea, MD 12/11/18 [phone]

## 2018-12-11 NOTE — ED Notes (Signed)
Patient in bed, laying on stomach. Informed patient need of 12 lead EKG; pt repositioned with no issues. Pt requested a sandwich, stated she is not nauseous anymore.

## 2018-12-13 LAB — LEVETIRACETAM LEVEL: Levetiracetam Lvl: 10.6 ug/mL (ref 10.0–40.0)

## 2018-12-13 LAB — LAMOTRIGINE LEVEL: Lamotrigine Lvl: <1 ug/mL — ABNORMAL LOW (ref 2.0–20.0)

## 2018-12-14 IMAGING — CT CT ANGIO CHEST
2 of 6 series · 19 of 36 positions shown · IV contrast (iopamidol)
Comparison: None.

CLINICAL DATA: Chest pain

EXAM:
CT ANGIOGRAPHY CHEST WITH CONTRAST
TECHNIQUE: Multidetector CT imaging of the chest was performed using the
standard protocol during bolus administration of intravenous
contrast. Multiplanar CT image reconstructions and MIPs were
obtained to evaluate the vascular anatomy.
CONTRAST:  100mL F3QNIN-XEA IOPAMIDOL (F3QNIN-XEA) INJECTION 76%

[Series 7: pe thins · axial · 0.62mm/px · z∈[+1262,+1470]mm · 18 of 332 slices shown]
[im 17/332  lung]
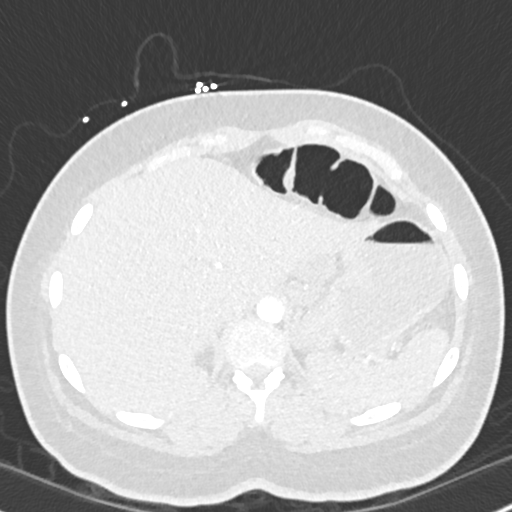
[im 34/332  mediastinal]
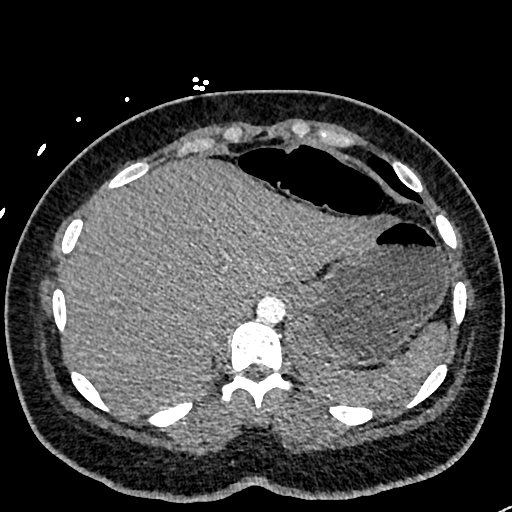
[im 50/332  lung]
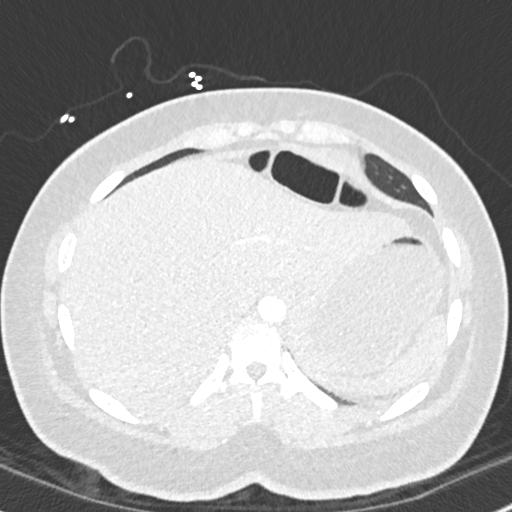
[im 67/332  mediastinal]
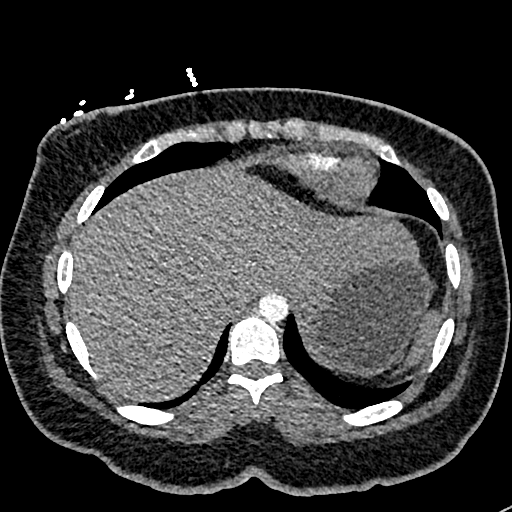
[im 83/332  lung]
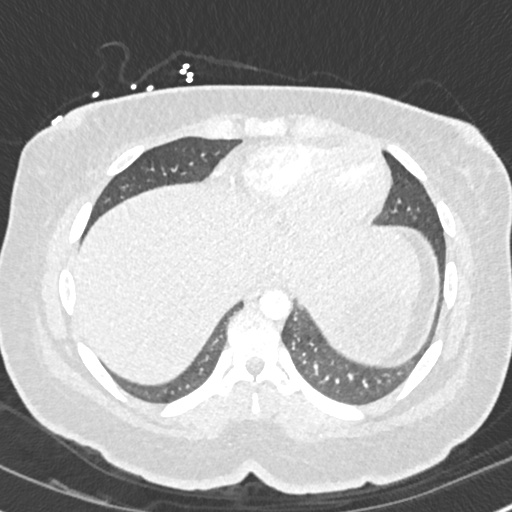
[im 100/332  mediastinal]
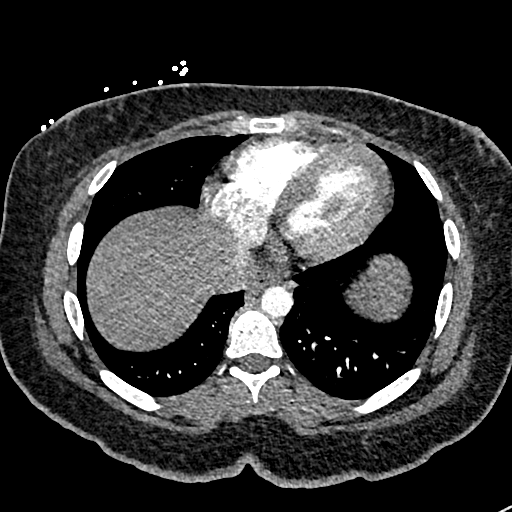
[im 116/332  lung]
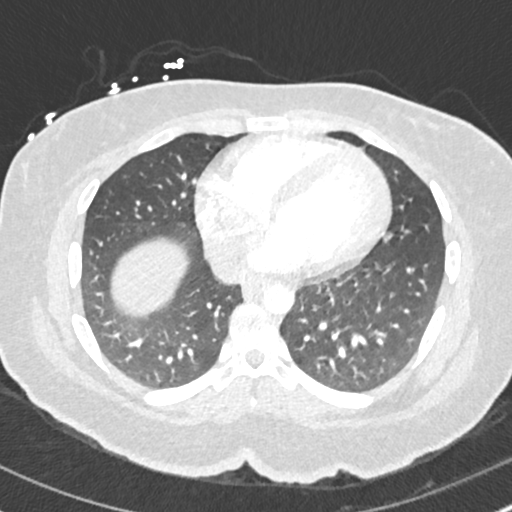
[im 133/332  mediastinal]
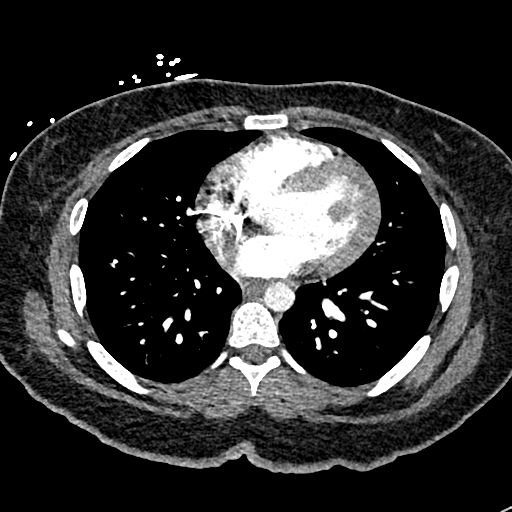
[im 149/332  lung]
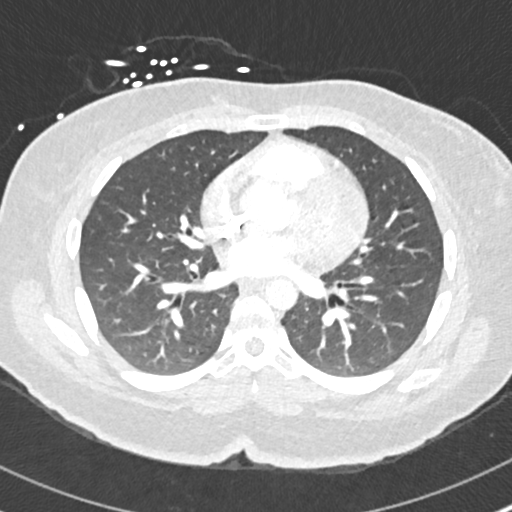
[im 183/332  mediastinal]
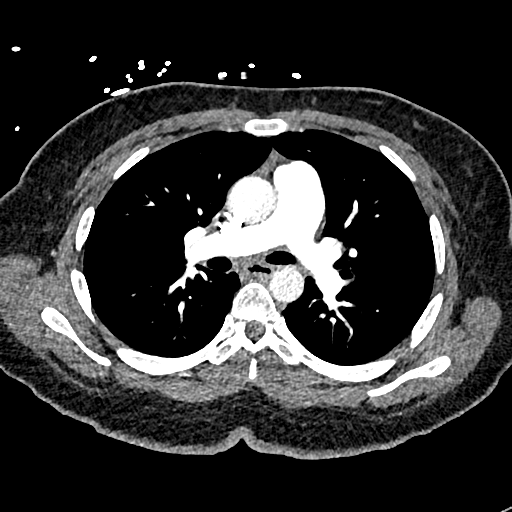
[im 199/332  lung]
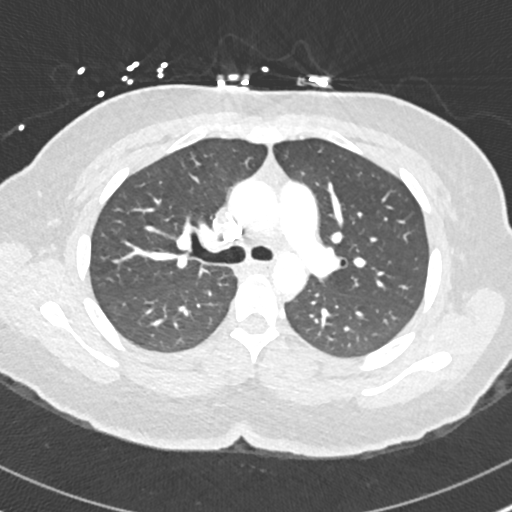
[im 216/332  mediastinal]
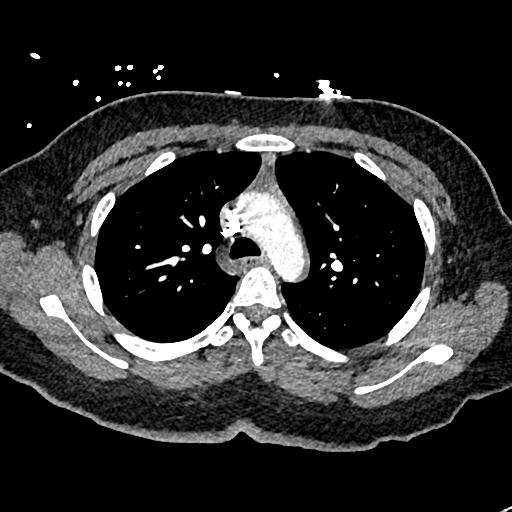
[im 232/332  lung]
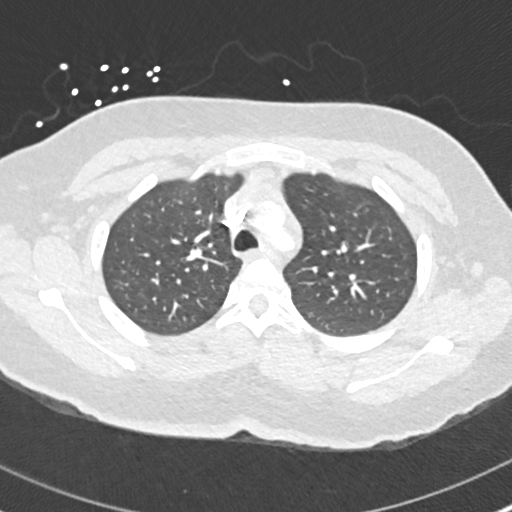
[im 249/332  mediastinal]
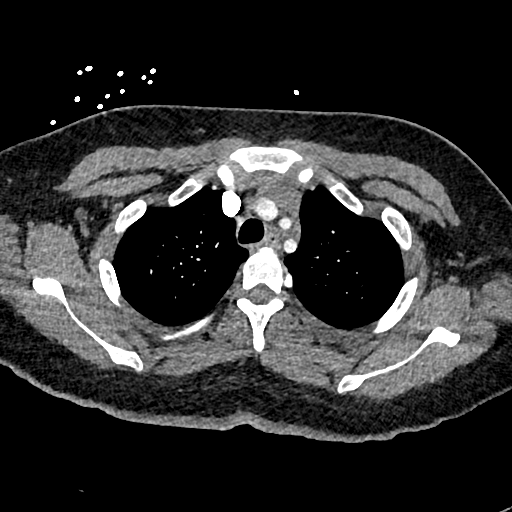
[im 265/332  lung]
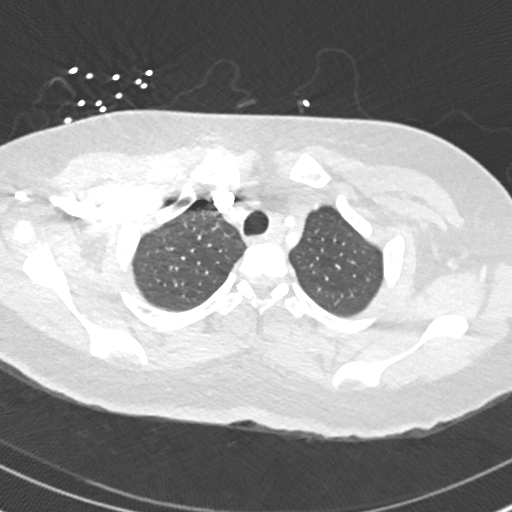
[im 282/332  mediastinal]
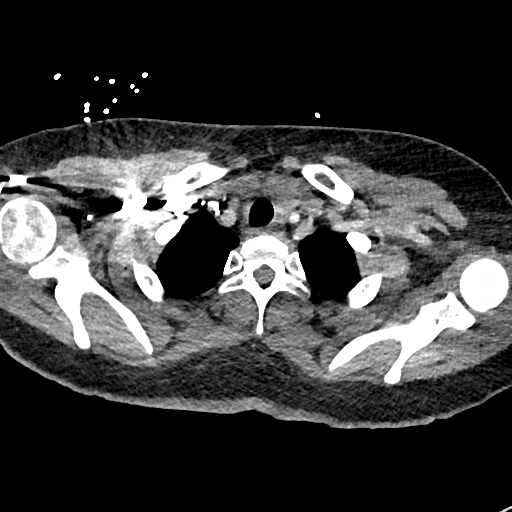
[im 298/332  lung]
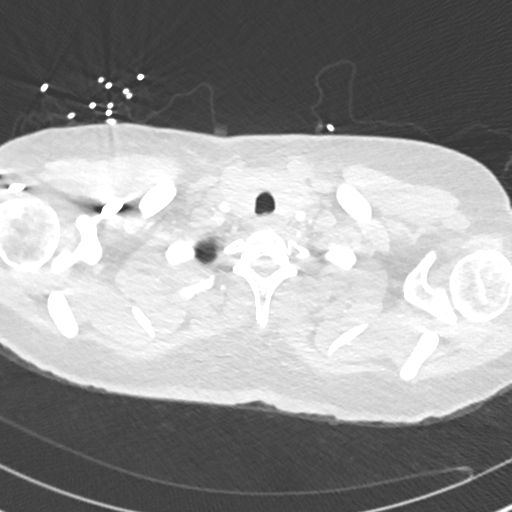
[im 315/332  mediastinal]
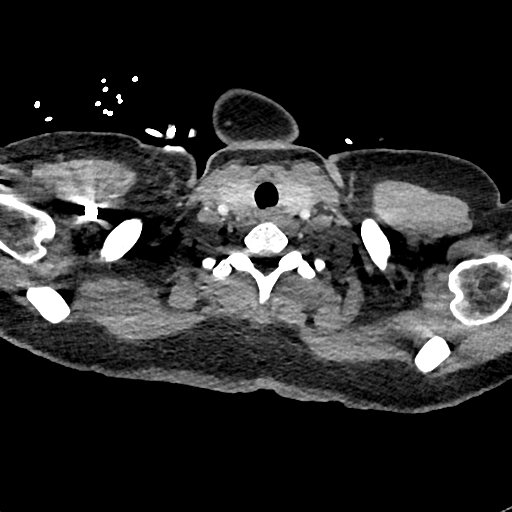

[Series 8: pe 2mm cor · coronal · 0.48mm/px · 1 of 151 slices shown]
[im 76/151  mediastinal]
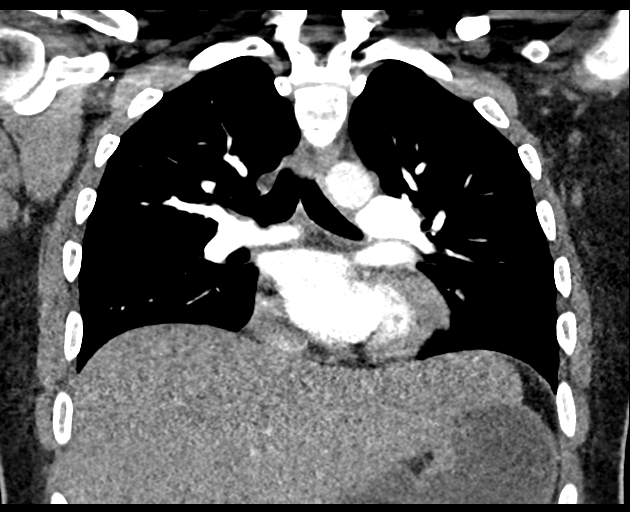

[19 of 36 positions shown; findings below may reference images not displayed]

FINDINGS: Cardiovascular: Heart is normal size. Aorta is normal caliber. No
filling defects in the pulmonary arteries to suggest pulmonary
emboli.

Mediastinum/Nodes: No mediastinal, hilar, or axillary adenopathy.

Lungs/Pleura: Lungs are clear. No focal airspace opacities or
suspicious nodules. No effusions.

Upper Abdomen: Imaging into the upper abdomen shows no acute
findings.

Musculoskeletal: Chest wall soft tissues are unremarkable. No acute
bony abnormality.

Review of the MIP images confirms the above findings.
IMPRESSION: No acute cardiopulmonary disease.

## 2018-12-14 IMAGING — DX DG CHEST 2V
2 series · 2 of 2 positions shown · non-contrast
Comparison: November 24, 2017

CLINICAL DATA: Chest pain

EXAM:
CHEST - 2 VIEW

[w chest pa]
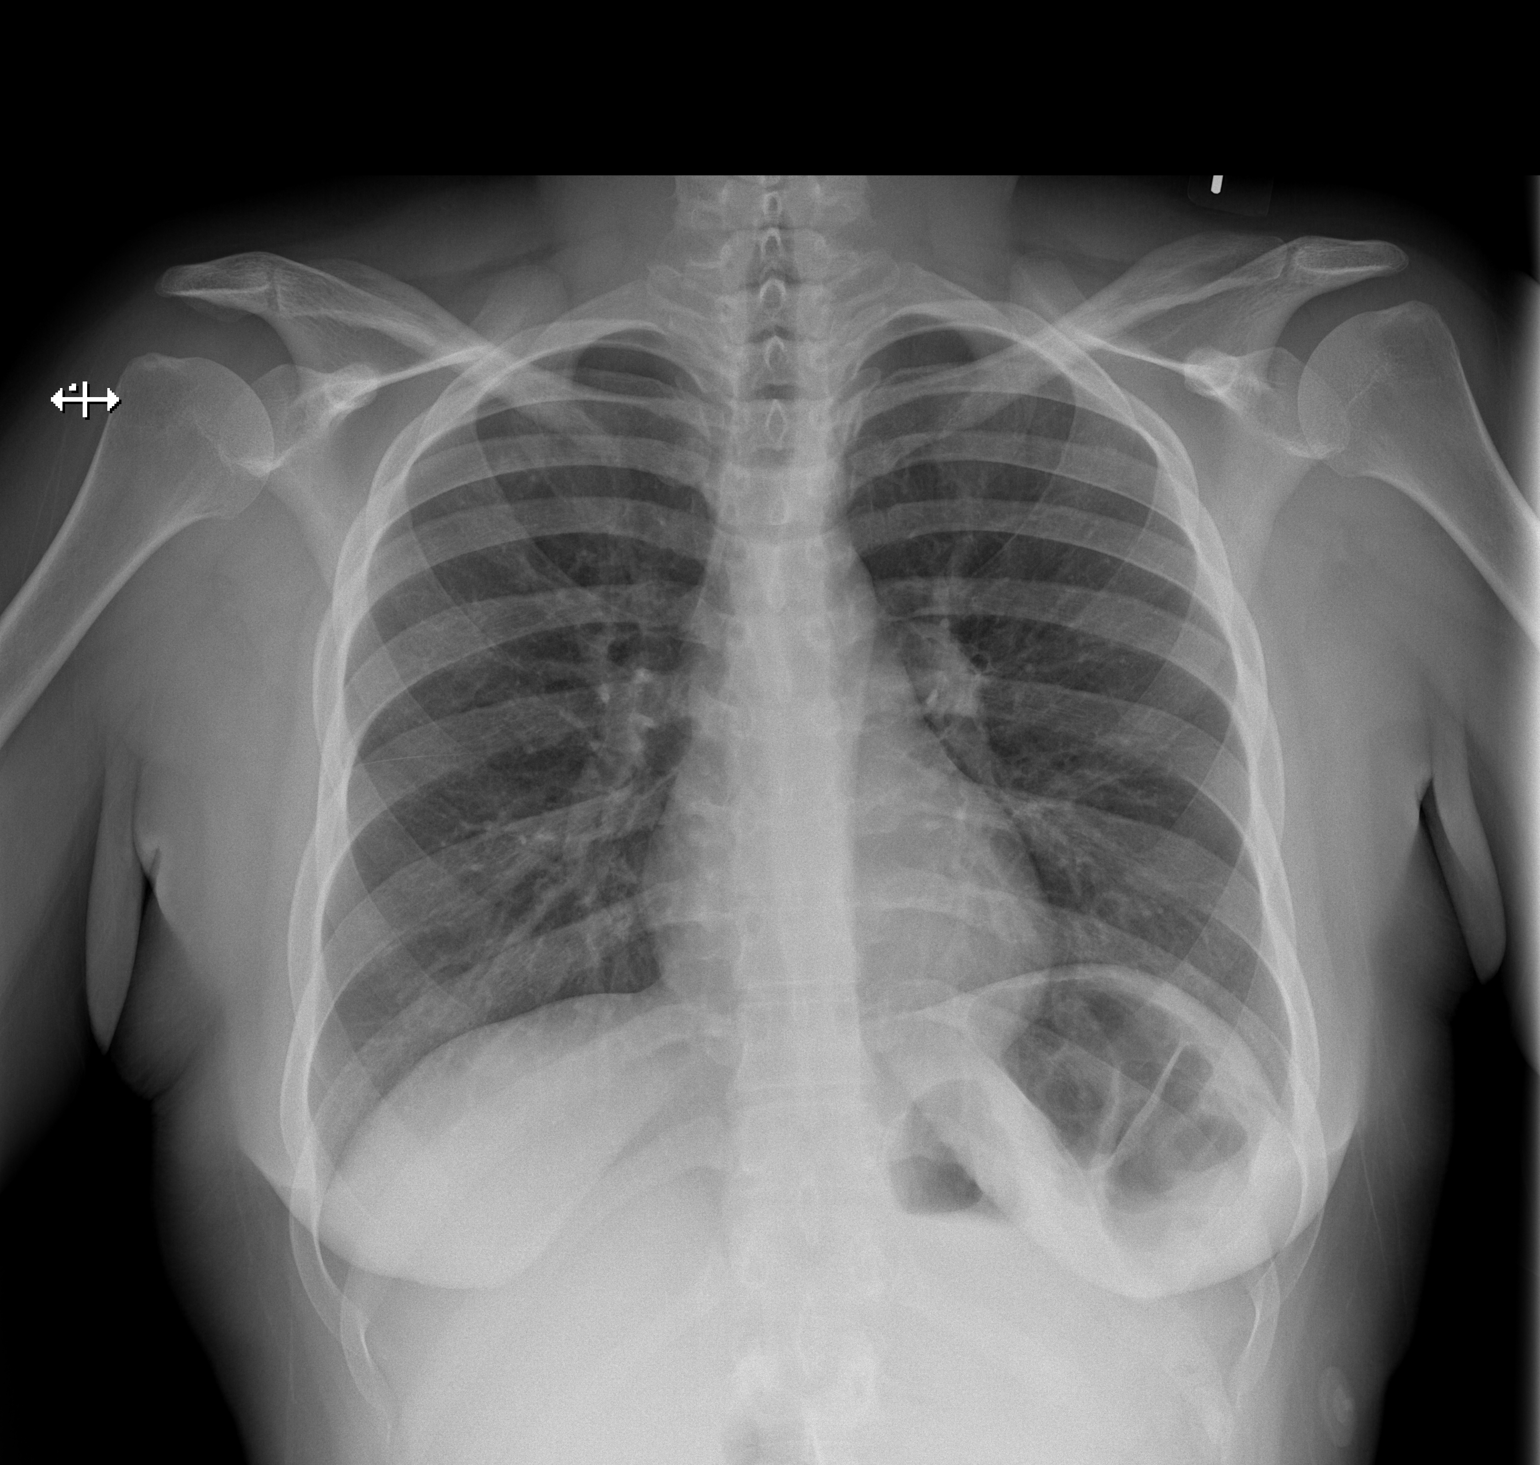

[w chest lat]
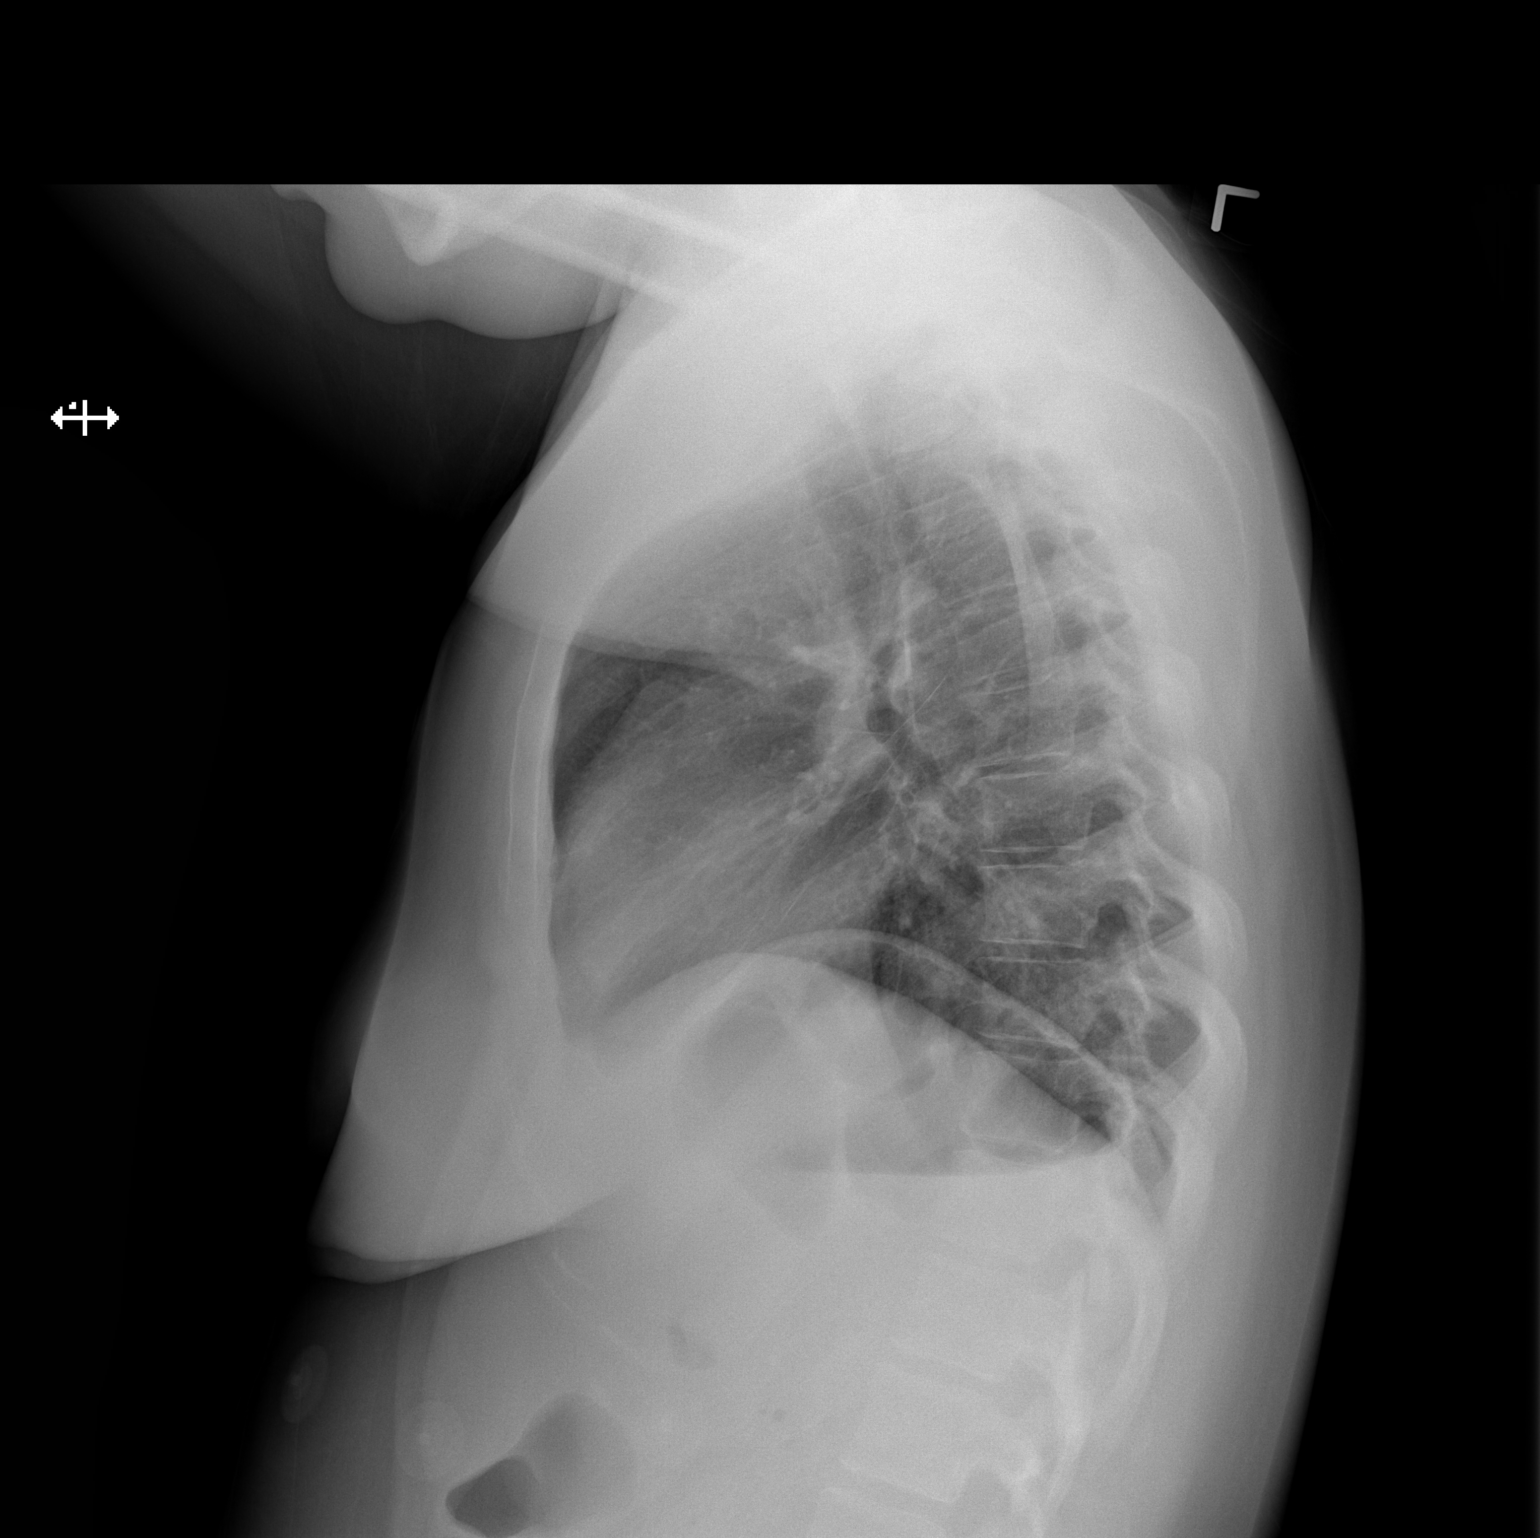

[2 of 2 positions shown; findings below may reference images not displayed]

FINDINGS: Lungs are clear. Heart size and pulmonary vascularity are normal. No
adenopathy. No bone lesions. No pneumothorax.
IMPRESSION: No edema or consolidation.

## 2018-12-15 ENCOUNTER — Telehealth: Payer: Self-pay

## 2018-12-15 NOTE — Telephone Encounter (Signed)
Patient called and she states that lamoTRIgine is making her dizzy. Please follow up with patient.

## 2018-12-15 NOTE — Telephone Encounter (Signed)
Spoke to patient and informed PCP did prescribed her that medication. She needs to contact who she see for her mood behavior.  Pt. Understood and will contact [facility].

## 2018-12-16 IMAGING — DX DG CHEST 1V PORT
1 series · 1 of 1 positions shown · non-contrast
Comparison: Chest radiographs and chest CTA dated 12/14/2017.

CLINICAL DATA: Chest pain tonight.

EXAM:
PORTABLE CHEST 1 VIEW

[chest]
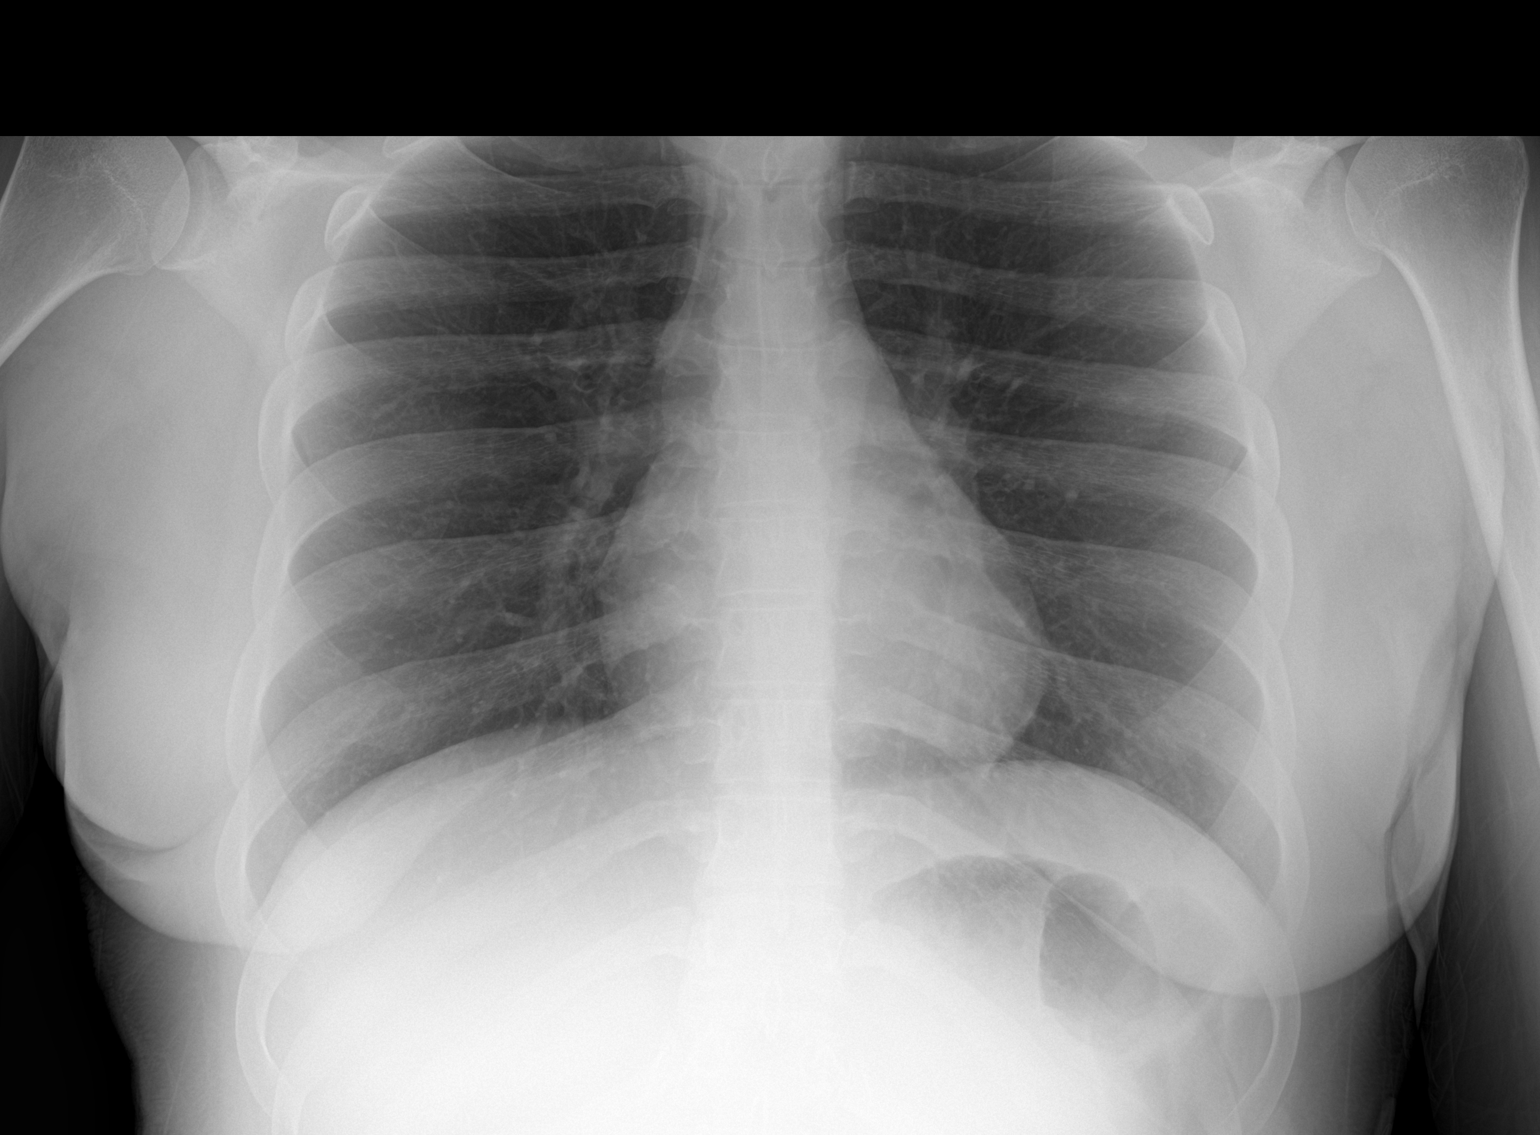

[1 of 1 positions shown; findings below may reference images not displayed]

FINDINGS: The heart size and mediastinal contours are within normal limits.
Both lungs are clear. The visualized skeletal structures are
unremarkable.
IMPRESSION: Normal examination.

## 2018-12-18 DIAGNOSIS — Z825 Family history of asthma and other chronic lower respiratory diseases: Secondary | ICD-10-CM

## 2018-12-18 DIAGNOSIS — Z9114 Patient's other noncompliance with medication regimen: Secondary | ICD-10-CM

## 2018-12-18 DIAGNOSIS — G8929 Other chronic pain: Secondary | ICD-10-CM | POA: Diagnosis present

## 2018-12-18 DIAGNOSIS — Z8049 Family history of malignant neoplasm of other genital organs: Secondary | ICD-10-CM

## 2018-12-18 DIAGNOSIS — I110 Hypertensive heart disease with heart failure: Secondary | ICD-10-CM | POA: Diagnosis present

## 2018-12-18 DIAGNOSIS — E1043 Type 1 diabetes mellitus with diabetic autonomic (poly)neuropathy: Principal | ICD-10-CM | POA: Diagnosis present

## 2018-12-18 DIAGNOSIS — E860 Dehydration: Secondary | ICD-10-CM | POA: Diagnosis present

## 2018-12-18 DIAGNOSIS — Z833 Family history of diabetes mellitus: Secondary | ICD-10-CM

## 2018-12-18 DIAGNOSIS — Z91018 Allergy to other foods: Secondary | ICD-10-CM

## 2018-12-18 DIAGNOSIS — Z794 Long term (current) use of insulin: Secondary | ICD-10-CM

## 2018-12-18 DIAGNOSIS — M545 Low back pain: Secondary | ICD-10-CM | POA: Diagnosis present

## 2018-12-18 DIAGNOSIS — Z79899 Other long term (current) drug therapy: Secondary | ICD-10-CM

## 2018-12-18 DIAGNOSIS — Z800 Family history of malignant neoplasm of digestive organs: Secondary | ICD-10-CM

## 2018-12-18 DIAGNOSIS — R0789 Other chest pain: Secondary | ICD-10-CM | POA: Diagnosis present

## 2018-12-18 DIAGNOSIS — F445 Conversion disorder with seizures or convulsions: Secondary | ICD-10-CM | POA: Diagnosis present

## 2018-12-18 DIAGNOSIS — Z20828 Contact with and (suspected) exposure to other viral communicable diseases: Secondary | ICD-10-CM | POA: Diagnosis present

## 2018-12-18 DIAGNOSIS — Z791 Long term (current) use of non-steroidal anti-inflammatories (NSAID): Secondary | ICD-10-CM

## 2018-12-18 DIAGNOSIS — R6510 Systemic inflammatory response syndrome (SIRS) of non-infectious origin without acute organ dysfunction: Secondary | ICD-10-CM | POA: Diagnosis present

## 2018-12-18 DIAGNOSIS — Z888 Allergy status to other drugs, medicaments and biological substances status: Secondary | ICD-10-CM

## 2018-12-18 DIAGNOSIS — D72829 Elevated white blood cell count, unspecified: Secondary | ICD-10-CM | POA: Diagnosis present

## 2018-12-18 DIAGNOSIS — K3184 Gastroparesis: Secondary | ICD-10-CM | POA: Diagnosis present

## 2018-12-18 DIAGNOSIS — Z8249 Family history of ischemic heart disease and other diseases of the circulatory system: Secondary | ICD-10-CM

## 2018-12-18 DIAGNOSIS — K5909 Other constipation: Secondary | ICD-10-CM | POA: Diagnosis present

## 2018-12-18 DIAGNOSIS — Z6834 Body mass index (BMI) 34.0-34.9, adult: Secondary | ICD-10-CM

## 2018-12-18 DIAGNOSIS — K219 Gastro-esophageal reflux disease without esophagitis: Secondary | ICD-10-CM | POA: Diagnosis present

## 2018-12-18 DIAGNOSIS — Z8349 Family history of other endocrine, nutritional and metabolic diseases: Secondary | ICD-10-CM

## 2018-12-18 DIAGNOSIS — I509 Heart failure, unspecified: Secondary | ICD-10-CM | POA: Diagnosis present

## 2018-12-18 DIAGNOSIS — E669 Obesity, unspecified: Secondary | ICD-10-CM | POA: Diagnosis present

## 2018-12-18 DIAGNOSIS — F418 Other specified anxiety disorders: Secondary | ICD-10-CM | POA: Diagnosis present

## 2018-12-18 DIAGNOSIS — E1065 Type 1 diabetes mellitus with hyperglycemia: Secondary | ICD-10-CM | POA: Diagnosis present

## 2018-12-19 ENCOUNTER — Other Ambulatory Visit: Payer: Self-pay

## 2018-12-19 ENCOUNTER — Emergency Department (HOSPITAL_COMMUNITY): Payer: Medicaid Other

## 2018-12-19 ENCOUNTER — Encounter (HOSPITAL_COMMUNITY): Payer: Self-pay | Admitting: Emergency Medicine

## 2018-12-19 ENCOUNTER — Inpatient Hospital Stay (HOSPITAL_COMMUNITY)
Admission: EM | Admit: 2018-12-19 | Discharge: 2018-12-23 | DRG: 074 | Disposition: A | Discharge status: Home / Self Care | Payer: Medicaid Other | Attending: Internal Medicine | Admitting: Internal Medicine

## 2018-12-19 DIAGNOSIS — I10 Essential (primary) hypertension: Secondary | ICD-10-CM | POA: Diagnosis present

## 2018-12-19 DIAGNOSIS — E1165 Type 2 diabetes mellitus with hyperglycemia: Secondary | ICD-10-CM | POA: Diagnosis not present

## 2018-12-19 DIAGNOSIS — R1084 Generalized abdominal pain: Secondary | ICD-10-CM | POA: Diagnosis present

## 2018-12-19 DIAGNOSIS — Z833 Family history of diabetes mellitus: Secondary | ICD-10-CM | POA: Diagnosis not present

## 2018-12-19 DIAGNOSIS — R6510 Systemic inflammatory response syndrome (SIRS) of non-infectious origin without acute organ dysfunction: Secondary | ICD-10-CM | POA: Diagnosis present

## 2018-12-19 DIAGNOSIS — R000 Tachycardia, unspecified: Secondary | ICD-10-CM | POA: Diagnosis present

## 2018-12-19 DIAGNOSIS — K5909 Other constipation: Secondary | ICD-10-CM | POA: Diagnosis present

## 2018-12-19 DIAGNOSIS — K3184 Gastroparesis: Secondary | ICD-10-CM | POA: Diagnosis present

## 2018-12-19 DIAGNOSIS — Z79899 Other long term (current) drug therapy: Secondary | ICD-10-CM | POA: Diagnosis not present

## 2018-12-19 DIAGNOSIS — E669 Obesity, unspecified: Secondary | ICD-10-CM | POA: Diagnosis present

## 2018-12-19 DIAGNOSIS — R0789 Other chest pain: Secondary | ICD-10-CM | POA: Diagnosis present

## 2018-12-19 DIAGNOSIS — Z791 Long term (current) use of non-steroidal anti-inflammatories (NSAID): Secondary | ICD-10-CM | POA: Diagnosis not present

## 2018-12-19 DIAGNOSIS — F339 Major depressive disorder, recurrent, unspecified: Secondary | ICD-10-CM | POA: Diagnosis not present

## 2018-12-19 DIAGNOSIS — R112 Nausea with vomiting, unspecified: Secondary | ICD-10-CM | POA: Diagnosis not present

## 2018-12-19 DIAGNOSIS — G8929 Other chronic pain: Secondary | ICD-10-CM | POA: Diagnosis present

## 2018-12-19 DIAGNOSIS — R569 Unspecified convulsions: Secondary | ICD-10-CM | POA: Diagnosis not present

## 2018-12-19 DIAGNOSIS — R072 Precordial pain: Secondary | ICD-10-CM | POA: Diagnosis not present

## 2018-12-19 DIAGNOSIS — E1143 Type 2 diabetes mellitus with diabetic autonomic (poly)neuropathy: Secondary | ICD-10-CM | POA: Diagnosis not present

## 2018-12-19 DIAGNOSIS — F418 Other specified anxiety disorders: Secondary | ICD-10-CM | POA: Diagnosis present

## 2018-12-19 DIAGNOSIS — D72829 Elevated white blood cell count, unspecified: Secondary | ICD-10-CM | POA: Diagnosis not present

## 2018-12-19 DIAGNOSIS — R55 Syncope and collapse: Secondary | ICD-10-CM | POA: Diagnosis present

## 2018-12-19 DIAGNOSIS — I152 Hypertension secondary to endocrine disorders: Secondary | ICD-10-CM | POA: Diagnosis present

## 2018-12-19 DIAGNOSIS — Z800 Family history of malignant neoplasm of digestive organs: Secondary | ICD-10-CM | POA: Diagnosis not present

## 2018-12-19 DIAGNOSIS — R079 Chest pain, unspecified: Secondary | ICD-10-CM | POA: Diagnosis not present

## 2018-12-19 DIAGNOSIS — Z91018 Allergy to other foods: Secondary | ICD-10-CM | POA: Diagnosis not present

## 2018-12-19 DIAGNOSIS — Z8349 Family history of other endocrine, nutritional and metabolic diseases: Secondary | ICD-10-CM | POA: Diagnosis not present

## 2018-12-19 DIAGNOSIS — Z6834 Body mass index (BMI) 34.0-34.9, adult: Secondary | ICD-10-CM | POA: Diagnosis not present

## 2018-12-19 DIAGNOSIS — Z8249 Family history of ischemic heart disease and other diseases of the circulatory system: Secondary | ICD-10-CM | POA: Diagnosis not present

## 2018-12-19 DIAGNOSIS — Z825 Family history of asthma and other chronic lower respiratory diseases: Secondary | ICD-10-CM | POA: Diagnosis not present

## 2018-12-19 DIAGNOSIS — R739 Hyperglycemia, unspecified: Secondary | ICD-10-CM | POA: Diagnosis present

## 2018-12-19 DIAGNOSIS — E860 Dehydration: Secondary | ICD-10-CM | POA: Diagnosis present

## 2018-12-19 DIAGNOSIS — Z20828 Contact with and (suspected) exposure to other viral communicable diseases: Secondary | ICD-10-CM | POA: Diagnosis present

## 2018-12-19 DIAGNOSIS — Z8049 Family history of malignant neoplasm of other genital organs: Secondary | ICD-10-CM | POA: Diagnosis not present

## 2018-12-19 DIAGNOSIS — E1065 Type 1 diabetes mellitus with hyperglycemia: Secondary | ICD-10-CM | POA: Diagnosis present

## 2018-12-19 DIAGNOSIS — F32A Depression, unspecified: Secondary | ICD-10-CM | POA: Diagnosis present

## 2018-12-19 DIAGNOSIS — F329 Major depressive disorder, single episode, unspecified: Secondary | ICD-10-CM | POA: Diagnosis present

## 2018-12-19 DIAGNOSIS — M545 Low back pain: Secondary | ICD-10-CM | POA: Diagnosis present

## 2018-12-19 DIAGNOSIS — Z888 Allergy status to other drugs, medicaments and biological substances status: Secondary | ICD-10-CM | POA: Diagnosis not present

## 2018-12-19 DIAGNOSIS — E1043 Type 1 diabetes mellitus with diabetic autonomic (poly)neuropathy: Secondary | ICD-10-CM | POA: Diagnosis present

## 2018-12-19 DIAGNOSIS — Z794 Long term (current) use of insulin: Secondary | ICD-10-CM | POA: Diagnosis not present

## 2018-12-19 LAB — POCT I-STAT EG7
Acid-base deficit: 3 mmol/L — ABNORMAL HIGH (ref 0.0–2.0)
Bicarbonate: 23.8 mmol/L (ref 20.0–28.0)
Calcium, Ion: 1.25 mmol/L (ref 1.15–1.40)
HCT: 43 % (ref 36.0–46.0)
Hemoglobin: 14.6 g/dL (ref 12.0–15.0)
O2 Saturation: 74 %
Potassium: 4.8 mmol/L (ref 3.5–5.1)
Sodium: 139 mmol/L (ref 135–145)
TCO2: 25 mmol/L (ref 22–32)
pCO2, Ven: 46.4 mmHg (ref 44.0–60.0)
pH, Ven: 7.318 (ref 7.250–7.430)
pO2, Ven: 43 mmHg (ref 32.0–45.0)

## 2018-12-19 LAB — COMPREHENSIVE METABOLIC PANEL
ALT: 16 U/L (ref 0–44)
AST: 11 U/L — ABNORMAL LOW (ref 15–41)
Albumin: 4.2 g/dL (ref 3.5–5.0)
Alkaline Phosphatase: 86 U/L (ref 38–126)
Anion gap: 14 (ref 5–15)
BUN: 13 mg/dL (ref 6–20)
CO2: 23 mmol/L (ref 22–32)
Calcium: 9.5 mg/dL (ref 8.9–10.3)
Chloride: 99 mmol/L (ref 98–111)
Creatinine, Ser: 0.81 mg/dL (ref 0.44–1.00)
GFR calc Af Amer: >60 mL/min (ref >60)
GFR calc non Af Amer: >60 mL/min (ref >60)
Glucose, Bld: 440 mg/dL — ABNORMAL HIGH (ref 70–99)
Potassium: 4.2 mmol/L (ref 3.5–5.1)
Sodium: 136 mmol/L (ref 135–145)
Total Bilirubin: 0.4 mg/dL (ref 0.3–1.2)
Total Protein: 6.9 g/dL (ref 6.5–8.1)

## 2018-12-19 LAB — URINALYSIS, ROUTINE W REFLEX MICROSCOPIC
Bacteria, UA: NONE SEEN
Bilirubin Urine: NEGATIVE
Glucose, UA: >=500 mg/dL — AB
Hgb urine dipstick: NEGATIVE
Ketones, ur: 80 mg/dL — AB
Leukocytes,Ua: NEGATIVE
Nitrite: NEGATIVE
Protein, ur: NEGATIVE mg/dL
Specific Gravity, Urine: >1.046 — ABNORMAL HIGH (ref 1.005–1.030)
pH: 5 (ref 5.0–8.0)

## 2018-12-19 LAB — CBC
HCT: 38.2 % (ref 36.0–46.0)
Hemoglobin: 12.5 g/dL (ref 12.0–15.0)
MCH: 27.1 pg (ref 26.0–34.0)
MCHC: 32.7 g/dL (ref 30.0–36.0)
MCV: 82.9 fL (ref 80.0–100.0)
Platelets: 319 10*3/uL (ref 150–400)
RBC: 4.61 MIL/uL (ref 3.87–5.11)
RDW: 13.1 % (ref 11.5–15.5)
WBC: 13.7 10*3/uL — ABNORMAL HIGH (ref 4.0–10.5)
nRBC: 0 % (ref 0.0–0.2)

## 2018-12-19 LAB — LIPASE, BLOOD: Lipase: 31 U/L (ref 11–51)

## 2018-12-19 LAB — TSH: TSH: 0.237 u[IU]/mL — ABNORMAL LOW (ref 0.350–4.500)

## 2018-12-19 LAB — CBG MONITORING, ED
Glucose-Capillary: 158 mg/dL — ABNORMAL HIGH (ref 70–99)
Glucose-Capillary: 159 mg/dL — ABNORMAL HIGH (ref 70–99)
Glucose-Capillary: 166 mg/dL — ABNORMAL HIGH (ref 70–99)
Glucose-Capillary: 166 mg/dL — ABNORMAL HIGH (ref 70–99)
Glucose-Capillary: 173 mg/dL — ABNORMAL HIGH (ref 70–99)
Glucose-Capillary: 232 mg/dL — ABNORMAL HIGH (ref 70–99)
Glucose-Capillary: 348 mg/dL — ABNORMAL HIGH (ref 70–99)
Glucose-Capillary: 394 mg/dL — ABNORMAL HIGH (ref 70–99)

## 2018-12-19 LAB — RAPID URINE DRUG SCREEN, HOSP PERFORMED
Amphetamines: POSITIVE — AB
Barbiturates: NOT DETECTED
Benzodiazepines: NOT DETECTED
Cocaine: NOT DETECTED
Opiates: NOT DETECTED
Tetrahydrocannabinol: NOT DETECTED

## 2018-12-19 LAB — I-STAT BETA HCG BLOOD, ED (MC, WL, AP ONLY): I-stat hCG, quantitative: <5 m[IU]/mL (ref <5)

## 2018-12-19 LAB — GLUCOSE, CAPILLARY
Glucose-Capillary: 133 mg/dL — ABNORMAL HIGH (ref 70–99)
Glucose-Capillary: 147 mg/dL — ABNORMAL HIGH (ref 70–99)
Glucose-Capillary: 153 mg/dL — ABNORMAL HIGH (ref 70–99)

## 2018-12-19 LAB — SARS CORONAVIRUS 2 (TAT 6-24 HRS): SARS Coronavirus 2: NEGATIVE

## 2018-12-19 LAB — MAGNESIUM: Magnesium: 2.1 mg/dL (ref 1.7–2.4)

## 2018-12-19 LAB — D-DIMER, QUANTITATIVE: D-Dimer, Quant: <0.27 ug/mL-FEU (ref 0.00–0.50)

## 2018-12-19 LAB — PHOSPHORUS: Phosphorus: 3.6 mg/dL (ref 2.5–4.6)

## 2018-12-19 LAB — LACTIC ACID, PLASMA: Lactic Acid, Venous: 1.7 mmol/L (ref 0.5–1.9)

## 2018-12-19 LAB — HEMOGLOBIN A1C
Hgb A1c MFr Bld: 11.3 % — ABNORMAL HIGH (ref 4.8–5.6)
Mean Plasma Glucose: 277.61 mg/dL

## 2018-12-19 LAB — TROPONIN I (HIGH SENSITIVITY)
Troponin I (High Sensitivity): <2 ng/L (ref <18)
Troponin I (High Sensitivity): <2 ng/L (ref <18)

## 2018-12-19 LAB — ETHANOL: Alcohol, Ethyl (B): <10 mg/dL (ref <10)

## 2018-12-19 MED ORDER — SODIUM CHLORIDE 0.9 % IV BOLUS
1000.0000 mL | Freq: Once | INTRAVENOUS | Status: AC
  Administered 2018-12-19: 1000 mL via INTRAVENOUS

## 2018-12-19 MED ORDER — ENOXAPARIN SODIUM 40 MG/0.4ML ~~LOC~~ SOLN
40.0000 mg | SUBCUTANEOUS | Status: DC
  Administered 2018-12-20 – 2018-12-23 (×4): 40 mg via SUBCUTANEOUS
  Filled 2018-12-19 (×5): qty 0.4

## 2018-12-19 MED ORDER — SODIUM CHLORIDE 0.9 % IV SOLN: INTRAVENOUS | Status: DC

## 2018-12-19 MED ORDER — INSULIN GLARGINE 100 UNIT/ML ~~LOC~~ SOLN
40.0000 [IU] | Freq: Every day | SUBCUTANEOUS | Status: DC
  Administered 2018-12-19: 40 [IU] via SUBCUTANEOUS
  Filled 2018-12-19 (×2): qty 0.4

## 2018-12-19 MED ORDER — INSULIN REGULAR(HUMAN) IN NACL 100-0.9 UT/100ML-% IV SOLN
INTRAVENOUS | Status: DC
  Administered 2018-12-19: 1.3 [IU]/h via INTRAVENOUS
  Administered 2018-12-19: 13 [IU]/h via INTRAVENOUS
  Administered 2018-12-19: 2.8 [IU]/h via INTRAVENOUS
  Filled 2018-12-19: qty 100

## 2018-12-19 MED ORDER — ACETAMINOPHEN 325 MG PO TABS
650.0000 mg | ORAL_TABLET | Freq: Four times a day (QID) | ORAL | Status: DC | PRN
  Administered 2018-12-21 – 2018-12-23 (×2): 650 mg via ORAL
  Filled 2018-12-19 (×2): qty 2

## 2018-12-19 MED ORDER — PROCHLORPERAZINE 25 MG RE SUPP
25.0000 mg | Freq: Two times a day (BID) | RECTAL | Status: DC | PRN
  Filled 2018-12-19: qty 1

## 2018-12-19 MED ORDER — ATORVASTATIN CALCIUM 10 MG PO TABS
20.0000 mg | ORAL_TABLET | Freq: Every day | ORAL | Status: DC
  Administered 2018-12-19 – 2018-12-23 (×5): 20 mg via ORAL
  Filled 2018-12-19 (×5): qty 2

## 2018-12-19 MED ORDER — INSULIN ASPART 100 UNIT/ML ~~LOC~~ SOLN
0.0000 [IU] | Freq: Three times a day (TID) | SUBCUTANEOUS | Status: DC
  Administered 2018-12-20: 5 [IU] via SUBCUTANEOUS
  Administered 2018-12-20 (×2): 11 [IU] via SUBCUTANEOUS

## 2018-12-19 MED ORDER — BUSPIRONE HCL 5 MG PO TABS
10.0000 mg | ORAL_TABLET | Freq: Every day | ORAL | Status: DC
  Administered 2018-12-19 – 2018-12-23 (×5): 10 mg via ORAL
  Filled 2018-12-19: qty 1
  Filled 2018-12-19 (×4): qty 2

## 2018-12-19 MED ORDER — METOCLOPRAMIDE HCL 10 MG PO TABS
10.0000 mg | ORAL_TABLET | Freq: Three times a day (TID) | ORAL | Status: DC
  Administered 2018-12-19 – 2018-12-23 (×17): 10 mg via ORAL
  Filled 2018-12-19 (×17): qty 1

## 2018-12-19 MED ORDER — DEXTROSE-NACL 5-0.45 % IV SOLN
INTRAVENOUS | Status: DC
  Administered 2018-12-19: 12:00:00 via INTRAVENOUS

## 2018-12-19 MED ORDER — PROPRANOLOL HCL 20 MG PO TABS
20.0000 mg | ORAL_TABLET | Freq: Two times a day (BID) | ORAL | Status: DC
  Administered 2018-12-20 – 2018-12-23 (×7): 20 mg via ORAL
  Filled 2018-12-19 (×9): qty 1

## 2018-12-19 MED ORDER — PANTOPRAZOLE SODIUM 40 MG PO TBEC
40.0000 mg | DELAYED_RELEASE_TABLET | Freq: Two times a day (BID) | ORAL | Status: DC
  Administered 2018-12-19 – 2018-12-23 (×9): 40 mg via ORAL
  Filled 2018-12-19 (×9): qty 1

## 2018-12-19 MED ORDER — IOHEXOL 300 MG/ML  SOLN
100.0000 mL | Freq: Once | INTRAMUSCULAR | Status: AC | PRN
  Administered 2018-12-19: 100 mL via INTRAVENOUS

## 2018-12-19 MED ORDER — MIRTAZAPINE 7.5 MG PO TABS
7.5000 mg | ORAL_TABLET | Freq: Every day | ORAL | Status: DC
  Administered 2018-12-20 – 2018-12-22 (×4): 7.5 mg via ORAL
  Filled 2018-12-19 (×5): qty 1

## 2018-12-19 MED ORDER — SODIUM CHLORIDE 0.9% FLUSH: 3.0000 mL | Freq: Once | INTRAVENOUS | Status: DC

## 2018-12-19 MED ORDER — INSULIN ASPART 100 UNIT/ML ~~LOC~~ SOLN
10.0000 [IU] | Freq: Once | SUBCUTANEOUS | Status: AC
  Administered 2018-12-19: 10 [IU] via SUBCUTANEOUS

## 2018-12-19 MED ORDER — TOPIRAMATE 25 MG PO TABS
25.0000 mg | ORAL_TABLET | Freq: Two times a day (BID) | ORAL | Status: DC
  Administered 2018-12-19 – 2018-12-23 (×8): 25 mg via ORAL
  Filled 2018-12-19 (×10): qty 1

## 2018-12-19 MED ORDER — DEXTROSE 50 % IV SOLN: 0.0000 mL | INTRAVENOUS | Status: DC | PRN

## 2018-12-19 MED ORDER — HYDROXYZINE HCL 25 MG PO TABS
50.0000 mg | ORAL_TABLET | Freq: Three times a day (TID) | ORAL | Status: DC | PRN
  Administered 2018-12-21: 50 mg via ORAL
  Filled 2018-12-19: qty 2

## 2018-12-19 MED ORDER — QUETIAPINE FUMARATE 50 MG PO TABS
400.0000 mg | ORAL_TABLET | Freq: Every day | ORAL | Status: DC
  Administered 2018-12-20 – 2018-12-22 (×4): 400 mg via ORAL
  Filled 2018-12-19 (×3): qty 8
  Filled 2018-12-19: qty 1
  Filled 2018-12-19: qty 8

## 2018-12-19 MED ORDER — LAMOTRIGINE 100 MG PO TABS
50.0000 mg | ORAL_TABLET | Freq: Two times a day (BID) | ORAL | Status: DC
  Administered 2018-12-20 – 2018-12-23 (×8): 50 mg via ORAL
  Filled 2018-12-19: qty 2
  Filled 2018-12-19 (×3): qty 1
  Filled 2018-12-19: qty 2
  Filled 2018-12-19 (×5): qty 1

## 2018-12-19 MED ORDER — ACETAMINOPHEN 650 MG RE SUPP: 650.0000 mg | Freq: Four times a day (QID) | RECTAL | Status: DC | PRN

## 2018-12-19 MED ORDER — INSULIN ASPART 100 UNIT/ML ~~LOC~~ SOLN
0.0000 [IU] | Freq: Every day | SUBCUTANEOUS | Status: DC
  Administered 2018-12-20: 3 [IU] via SUBCUTANEOUS

## 2018-12-19 MED ORDER — METOCLOPRAMIDE HCL 5 MG/ML IJ SOLN
10.0000 mg | Freq: Once | INTRAMUSCULAR | Status: AC
  Administered 2018-12-19: 10 mg via INTRAVENOUS
  Filled 2018-12-19: qty 2

## 2018-12-19 MED ORDER — HYDROMORPHONE HCL 1 MG/ML IJ SOLN
0.5000 mg | INTRAMUSCULAR | Status: DC | PRN
  Administered 2018-12-23: 0.5 mg via INTRAVENOUS
  Filled 2018-12-19 (×2): qty 1

## 2018-12-19 MED ORDER — ONDANSETRON HCL 4 MG PO TABS: 4.0000 mg | ORAL_TABLET | Freq: Four times a day (QID) | ORAL | Status: DC | PRN

## 2018-12-19 MED ORDER — ONDANSETRON HCL 4 MG/2ML IJ SOLN: 4.0000 mg | Freq: Four times a day (QID) | INTRAMUSCULAR | Status: DC | PRN

## 2018-12-19 NOTE — ED Notes (Signed)
Attempted to call nursing report.  

## 2018-12-19 NOTE — H&P (Addendum)
History and Physical    Nancy [name] TOI:[id number] DOB: 27-Feb-2000 DOA: 12/19/2018  PCP: [name], NP  Patient coming from: Home  I have personally briefly reviewed patient's old medical records in [place]  Chief Complaint: Nausea/vomiting/abdominal pain, seizures, chest pain, syncope  HPI: Nancy [name] is a 18 y.o. female with medical history significant of uncontrolled type 1 diabetes mellitus, hypertension, hyperlipidemia, GERD, obesity, depression with anxiety, seizure/pseudoseizure disorder presents to emergency department with multiple medical issues.  She tells me that she has nausea, vomiting, abdominal pain, chest pain since 1 week.  Her abdominal pain is severe, generalized, nonradiating, no aggravating or relieving factors, associated with nausea and vomiting, could not keep anything down.  Reports severe chest pain midsternal, nonradiating, associated with shortness of breath.  Reports that she passed out 4 times this morning and had seizure which lasted for 10 minutes before arrival to the emergency department.  She tells me that she peed on herself when she had seizure.  She tells me that she has headache, blurry vision, lightheadedness, dizziness however denies fever, chills, cough, congestion, urinary symptoms such as dysuria, hematuria, foul-smelling urine, palpitation, leg swelling, orthopnea, PND, decreased appetite or sleep changes.  She tells me that although she continues to have nausea and vomiting and abdominal pain she is compliant with her insulin regime and her antiepileptic medication.  She forgot to take Lantus last night and this morning she took her 60 units of Lantus.  History of multiple admission last month with similar symptoms.  She tells me that she hads to find a new neurologist for her seizure disorder as her previous neurologist is retired.  Work-up on previous admission including MRI, EEG, carotid Doppler, transthoracic echo all  were negative.   ED Course: Upon arrival: Patient tachycardic, tachypneic, maintaining oxygen saturation on room air, WBC elevated at 13.7, troponin x2 -, blood sugar 440, lipase: WNL, anion gap closed.  Lactic acid/D-dimer, chest x-ray, CT abdomen: WNL.  UA positive for glucose and ketones.  Patient started on insulin drip and received 2 IV fluid boluses.  Review of Systems: As per HPI otherwise negative.    Past Medical History:  Diagnosis Date  . Acanthosis nigricans   . Anxiety   . CHF (congestive heart failure) ([place])   . Chronic lower back pain   . Depression   . DKA, type 1 ([place]) 09/13/2018  . Dyspepsia   . Obesity   . Ovarian cyst    pt is not aware of this hx (11/24/2017)  . Pre-diabetes   . 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 Premature baby   . Seizures ([place])   . Type II diabetes mellitus ([facility])    insulin dependant    Past Surgical History:  Procedure Laterality Date  . ABDOMINAL HERNIA REPAIR     "I was a baby"  . BIOPSY  10/12/2018   Procedure: BIOPSY;  Surgeon: Jackquline Denmark, MD;  Location: [facility] ENDOSCOPY;  Service: Endoscopy;;  . ESOPHAGOGASTRODUODENOSCOPY (EGD) WITH PROPOFOL N/A 10/12/2018   Procedure: ESOPHAGOGASTRODUODENOSCOPY (EGD) WITH PROPOFOL;  Surgeon: Jackquline Denmark, MD;  Location: [facility] ENDOSCOPY;  Service: Endoscopy;  Laterality: N/A;  . HERNIA REPAIR    . LEFT HEART CATH AND CORONARY ANGIOGRAPHY N/A 10/13/2018   Procedure: LEFT HEART CATH AND CORONARY ANGIOGRAPHY;  Surgeon: Burnell Blanks, MD;  Location: [place] CV LAB;  Service: Cardiovascular;  Laterality: N/A;  . TONSILLECTOMY AND ADENOIDECTOMY    . [place] EXTRACTION  2017  reports that she has never smoked. She has never used smokeless tobacco. She reports that she does not drink alcohol or use drugs.  Allergies  Allergen Reactions  . Ibuprofen Other (See Comments)    GI MD said to not take this anymore  . Oatmeal Rash    Family History  Problem Relation Age of Onset  .  Diabetes Mother   . Hypertension Mother   . Obesity Mother   . Asthma Mother   . Allergic rhinitis Mother   . Eczema Mother   . Cervical cancer Mother   . Diabetes Father   . Hypertension Father   . Obesity Father   . Hyperlipidemia Father   . Hypertension Paternal Aunt   . Hypertension Maternal Grandfather   . Colon cancer Maternal Grandfather   . Diabetes Paternal Grandmother   . Obesity Paternal Grandmother   . Diabetes Paternal Grandfather   . Obesity Paternal Grandfather   . Angioedema Neg Hx   . Immunodeficiency Neg Hx   . Urticaria Neg Hx   . Stomach cancer Neg Hx   . Esophageal cancer Neg Hx     Prior to Admission medications   Medication Sig Start Date End Date Taking? Authorizing Provider  atorvastatin (LIPITOR) 20 MG tablet Take 1 tablet (20 mg total) by mouth daily at 6 PM. 10/05/18   Storm Frisk, MD  busPIRone (BUSPAR) 10 MG tablet Take 10 mg by mouth 3 (three) times daily. 11/19/18   [provider]  dicyclomine (BENTYL) 10 MG capsule Take 10 mg by mouth 3 (three) times daily as needed (for abdominal pain).    [provider]  escitalopram (LEXAPRO) 20 MG tablet Take 20 mg by mouth daily.    [provider]  FLUoxetine (PROZAC) 40 MG capsule Take 40 mg by mouth daily.    [provider]  hydrOXYzine (ATARAX/VISTARIL) 50 MG tablet Take 1 tablet (50 mg total) by mouth 3 (three) times daily as needed for anxiety. Patient taking differently: Take 50 mg by mouth 3 (three) times daily.  11/11/18   Armandina Stammer I, NP  insulin glargine (LANTUS) 100 UNIT/ML injection Inject 0.6 mLs (60 Units total) into the skin at bedtime. For diabetes management 11/11/18   Armandina Stammer I, NP  insulin lispro (HUMALOG) 100 UNIT/ML injection Inject 0.15 mLs (15 Units total) into the skin 2 (two) times daily with a meal. 10/05/18   Storm Frisk, MD  lamoTRIgine (LAMICTAL) 25 MG tablet Take 2 tablets (50 mg total) by mouth 2 (two) times daily. For mood  stabilization 11/11/18   Armandina Stammer I, NP  lubiprostone (AMITIZA) 24 MCG capsule Take 1 capsule (24 mcg total) by mouth 2 (two) times daily with a meal. 10/27/18   Esterwood, Amy S, PA-C  meloxicam (MOBIC) 7.5 MG tablet Take 7.5 mg by mouth daily as needed for pain.    [provider]  metoCLOPramide (REGLAN) 10 MG tablet Take 1 tablet (10 mg total) by mouth 4 (four) times daily -  before meals and at bedtime. 11/30/18 12/30/18  Jerald Kief, MD  mirtazapine (REMERON) 7.5 MG tablet Take 1 tablet (7.5 mg total) by mouth at bedtime. For depression/sleep 11/11/18   Armandina Stammer I, NP  Multiple Vitamin (MULTIVITAMIN WITH MINERALS) TABS tablet Take 1 tablet by mouth daily. 09/18/18   Clapacs, Jackquline Denmark, MD  Olopatadine HCl (PAZEO) 0.7 % SOLN Place 3 drops into both eyes 2 (two) times daily.    [provider]  pantoprazole (PROTONIX) 40 MG tablet Take 1 tablet (40 mg total) by mouth 2 (two) times daily. For acid reflux 11/11/18   Armandina StammerNwoko, Agnes I, NP  prochlorperazine (COMPAZINE) 25 MG suppository Place 1 suppository (25 mg total) rectally every 12 (twelve) hours as needed for nausea or vomiting. 12/05/18   Melene PlanFloyd, Dan, DO  promethazine (PHENERGAN) 25 MG suppository Place 25 mg rectally every 8 (eight) hours as needed for nausea or vomiting.    [provider]  propranolol (INDERAL) 20 MG tablet Take 1 tablet (20 mg total) by mouth 3 (three) times daily. For anxiety/HTN 11/11/18   Armandina StammerNwoko, Agnes I, NP  QUEtiapine (SEROQUEL) 200 MG tablet Take 400 mg by mouth at bedtime.     [provider]  RESTASIS 0.05 % ophthalmic emulsion Place 1 drop into both eyes 2 (two) times daily. 11/16/18   [provider]  topiramate (TOPAMAX) 25 MG tablet Take 1 tablet (25 mg total) by mouth 2 (two) times daily. For seizure activities 11/11/18   Armandina StammerNwoko, Agnes I, NP    Physical Exam: Vitals:   12/19/18 1250 12/19/18 1300 12/19/18 1345 12/19/18 1415  BP: 123/70 134/80 106/64 111/66  Pulse:  (!) 120 (!) 130 (!) 119 (!) 113  Resp: 20  (!) 24 (!) 24  Temp:      TempSrc:      SpO2: 100% 100% 100% 100%  Weight:      Height:        Constitutional: NAD, calm, comfortable, obese, appears dehydrated  eyes: PERRL, lids and conjunctivae normal ENMT: Mucous membranes are dry.  Posterior pharynx clear of any exudate or lesions.Normal dentition.  Neck: normal, supple, no masses, no thyromegaly Respiratory: clear to auscultation bilaterally, no wheezing, no crackles. Normal respiratory effort. No accessory muscle use.  Cardiovascular: Regular rate and rhythm, no murmurs / rubs / gallops. No extremity edema. 2+ pedal pulses. No carotid bruits.  Abdomen: Abdomen is soft, tenderness generalized positive, no guarding, no rigidity, no hepatosplenomegaly, bowel sounds positive.   Musculoskeletal: no clubbing / cyanosis. No joint deformity upper and lower extremities. Good ROM, no contractures. Normal muscle tone.  Skin: no rashes, lesions, ulcers. No induration Neurologic: CN 2-12 grossly intact. Sensation intact, DTR normal. Strength 5/5 in all 4.  Psychiatric: Normal judgment and insight. Alert and oriented x 3. Normal mood.    Labs on Admission: I have personally reviewed following labs and imaging studies  CBC: Recent Labs  Lab 12/19/18 0020 12/19/18 0921  WBC 13.7*  --   HGB 12.5 14.6  HCT 38.2 43.0  MCV 82.9  --   PLT 319  --    Basic Metabolic Panel: Recent Labs  Lab 12/19/18 0020 12/19/18 0921  NA 136 139  K 4.2 4.8  CL 99  --   CO2 23  --   GLUCOSE 440*  --   BUN 13  --   CREATININE 0.81  --   CALCIUM 9.5  --    GFR: Estimated Creatinine Clearance: 120.9 mL/min (by C-G formula based on SCr of 0.81 mg/dL). Liver Function Tests: Recent Labs  Lab 12/19/18 0020  AST 11*  ALT 16  ALKPHOS 86  BILITOT 0.4  PROT 6.9  ALBUMIN 4.2   Recent Labs  Lab 12/19/18 0020  LIPASE 31   No results for input(s): AMMONIA in the last 168 hours. Coagulation Profile: No  results for input(s): INR, PROTIME in the last 168 hours. Cardiac Enzymes: No results for input(s): CKTOTAL, CKMB, CKMBINDEX, TROPONINI in  the last 168 hours. BNP (last 3 results) No results for input(s): PROBNP in the last 8760 hours. HbA1C: No results for input(s): HGBA1C in the last 72 hours. CBG: Recent Labs  Lab 12/19/18 0019 12/19/18 1020 12/19/18 1215 12/19/18 1338 12/19/18 1439  GLUCAP 394* 348* 232* 166* 166*   Lipid Profile: No results for input(s): CHOL, HDL, LDLCALC, TRIG, CHOLHDL, LDLDIRECT in the last 72 hours. Thyroid Function Tests: No results for input(s): TSH, T4TOTAL, FREET4, T3FREE, THYROIDAB in the last 72 hours. Anemia Panel: No results for input(s): VITAMINB12, FOLATE, FERRITIN, TIBC, IRON, RETICCTPCT in the last 72 hours. Urine analysis:    Component Value Date/Time   COLORURINE STRAW (A) 12/19/2018 1244   APPEARANCEUR CLEAR 12/19/2018 1244   LABSPEC >1.046 (H) 12/19/2018 1244   PHURINE 5.0 12/19/2018 1244   GLUCOSEU >=500 (A) 12/19/2018 1244   HGBUR NEGATIVE 12/19/2018 1244   BILIRUBINUR NEGATIVE 12/19/2018 1244   BILIRUBINUR negative 07/13/2018 1651   KETONESUR 80 (A) 12/19/2018 1244   PROTEINUR NEGATIVE 12/19/2018 1244   UROBILINOGEN 0.2 07/13/2018 1651   UROBILINOGEN 1.0 07/09/2017 1324   NITRITE NEGATIVE 12/19/2018 1244   LEUKOCYTESUR NEGATIVE 12/19/2018 1244    Radiological Exams on Admission: DG Chest 2 View  Result Date: 12/19/2018 CLINICAL DATA:  Chest pain EXAM: CHEST - 2 VIEW COMPARISON:  11/07/2018 FINDINGS: Shallow lung inflation. Normal cardiomediastinal contours. No focal airspace consolidation or pulmonary edema. No pleural effusion or pneumothorax. IMPRESSION: No active cardiopulmonary disease. Electronically Signed   By: Deatra RobinsonKevin  Herman M.D.   On: 12/19/2018 00:42   CT Abdomen Pelvis W Contrast  Result Date: 12/19/2018 CLINICAL DATA:  Back pain.  Flank pain. EXAM: CT ABDOMEN AND PELVIS WITH CONTRAST TECHNIQUE: Multidetector CT  imaging of the abdomen and pelvis was performed using the standard protocol following bolus administration of intravenous contrast. CONTRAST:  100mL OMNIPAQUE IOHEXOL 300 MG/ML  SOLN COMPARISON:  CT 10/08/2018 FINDINGS: Patient scanned in nonstandard position due to back pain Lower chest: Lung bases are clear. Hepatobiliary: No focal hepatic lesion. No biliary duct dilatation. Gallbladder is normal. Common bile duct is normal. Pancreas: Pancreas is normal. No ductal dilatation. No pancreatic inflammation. Spleen: Normal spleen Adrenals/urinary tract: Adrenal glands and kidneys are normal. The ureters and bladder normal. Bladder mildly distended. Stomach/Bowel: Stomach, small bowel, appendix, and cecum are normal. The colon and rectosigmoid colon are normal. Vascular/Lymphatic: Abdominal aorta is normal caliber. No periportal or retroperitoneal adenopathy. No pelvic adenopathy. Reproductive: Uterus and adnexa normal. Other: No free fluid or free air. Musculoskeletal: No aggressive osseous lesion. IMPRESSION: 1. No explanation for back pain.  No obstructive uropathy. 2. Normal appendix. 3. Bladder is distended. Electronically Signed   By: Genevive BiStewart  Edmunds M.D.   On: 12/19/2018 11:35    EKG: Sinus tachycardia with prolonged QT interval, no ST elevation or depression noted. Assessment/Plan Active Problems:   Obesity   Hypertension   Nausea and vomiting   Generalized abdominal pain   Chest pain   Tachycardia   Syncope   Depression   Seizures (HCC)   Uncontrolled diabetes mellitus with hyperglycemia (HCC)   Leukocytosis   Dehydration   Uncontrolled type 1 diabetes mellitus: -Likely secondary to medication noncompliance.  Last A1c 10.2 checked 1 month ago. -Upon arrival her blood sugar was 440.  Patient started on insulin drip -Blood sugar is improving.  Will admit patient on the floor for close monitoring.  On telemetry -Received 2 IV fluid boluses.  Continue normal saline-when blood sugar more than  250, start  D5 half-normal saline if blood sugar less than 250.Marland Kitchen  Continue insulin gtt. -Anion gap is closed. -Consulted diabetic counselor -Monitor blood sugar closely.  Nausea, vomiting, generalized abdominal pain: -Unknown etiology?,  UA negative for infection, CT abdomen/pelvis negative for acute findings.  Chest x-ray is negative.  Lipase: WNL, lactic acid: WNL. -Keep her n.p.o. except meds, continue IV fluids. -Zofran as needed for nausea and vomiting.  Dilaudid as needed for severe pain.  Chest pain: Unknown etiology -Troponin x2 -, EKG: No ST elevation or depression. -Had cardiac cath on 10/13/2018 which came back negative. -Continue to monitor.  On telemetry.  Syncope/seizure/pseudoseizure disorder: -Work-up from 12/01/2018 such as carotid ultrasound: Negative, transthoracic echo: Shows preserved ejection fraction/normal systolic and diastolic function, MRI: No acute finding, EEG: WNL. -On Neurontin/Topamax/Lamictal at home-we will continue same -On seizure precautions.  Dehydration: -UA positive for ketones and patient appears to be dehydrated on exam. -Continue IV fluids.  Lactic acid: WNL.  Sinus tachycardia: -Patient has leukocytosis of 13.7-likely secondary to dehydration.  Patient is afebrile, chest x-ray and CT abdomen/pelvis negative.  UA: Negative for infection. -We will get blood culture/ethanol level/urine drug screen -No indication of antibiotics at this time.  Depression with anxiety: -Continue BuSpar, hydroxyzine, Remeron, Seroquel  GERD: Continue PPI  Obesity: BMI of 34.3 -Counseled regarding dietary modification, exercise and weight loss.  DVT prophylaxis: TED/SCD/Lovenox Code Status: Full code  family Communication: None present at bedside.  Plan of care discussed with patient in length and he verbalized understanding and agreed with it. Disposition Plan: TBD Consults called: None Admission status: Inpatient   Ollen Bowl MD [facility] Pager [phone]  If 7PM-7AM, please contact night-coverage [email or web address] Password [email or web address]  12/19/2018, 2:59 PM

## 2018-12-19 NOTE — ED Notes (Signed)
ED TO INPATIENT HANDOFF REPORT  ED Nurse Name and Phone #: Laree Garron, [phone]  S Name/Age/Gender Nancy [name] 18 y.o. female Room/Bed: 005C/005C  Code Status   Code Status: Full Code  Home/SNF/Other Home Patient oriented to: self, place, time and situation Is this baseline? Yes   Triage Complete: Triage complete  Chief Complaint Uncontrolled diabetes mellitus ([place]) [E11.65]  Triage Note BIB EMS from home. Pt presents with multiple complaints. Reports chest pain, abd pain, states she had a seizure this AM. CBG 315 en route. VSS.     Allergies Allergies  Allergen Reactions  . Ibuprofen Other (See Comments)    GI MD said to not take this anymore  . Oatmeal Rash    Level of Care/Admitting Diagnosis ED Disposition    ED Disposition Condition Comment   Admit  Hospital Area: [place] [[id number]]  Level of Care: Telemetry Medical [104]  Covid Evaluation: Asymptomatic Screening Protocol (No Symptoms)  Diagnosis: Uncontrolled diabetes mellitus [facility]) [[id number]]  Admitting Physician: Mckinley Jewel [[id number]]  Attending Physician: Mckinley Jewel [phone]  Estimated length of stay: past midnight tomorrow  Certification:: I certify this patient will need inpatient services for at least 2 midnights       B Medical/Surgery History Past Medical History:  Diagnosis Date  . Acanthosis nigricans   . Anxiety   . CHF (congestive heart failure) ([place])   . Chronic lower back pain   . Depression   . DKA, type 1 ([place]) 09/13/2018  . Dyspepsia   . Obesity   . Ovarian cyst    pt is not aware of this hx (11/24/2017)  . Pre-diabetes   . 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 Premature baby   . Seizures ([place])   . Type II diabetes mellitus ([facility])    insulin dependant   Past Surgical History:  Procedure Laterality Date  . ABDOMINAL HERNIA REPAIR     "I was a baby"  . BIOPSY  10/12/2018   Procedure: BIOPSY;  Surgeon: Jackquline Denmark, MD;  Location: [facility] ENDOSCOPY;  Service:  Endoscopy;;  . ESOPHAGOGASTRODUODENOSCOPY (EGD) WITH PROPOFOL N/A 10/12/2018   Procedure: ESOPHAGOGASTRODUODENOSCOPY (EGD) WITH PROPOFOL;  Surgeon: Jackquline Denmark, MD;  Location: [facility] ENDOSCOPY;  Service: Endoscopy;  Laterality: N/A;  . HERNIA REPAIR    . LEFT HEART CATH AND CORONARY ANGIOGRAPHY N/A 10/13/2018   Procedure: LEFT HEART CATH AND CORONARY ANGIOGRAPHY;  Surgeon: Burnell Blanks, MD;  Location: [place] CV LAB;  Service: Cardiovascular;  Laterality: N/A;  . TONSILLECTOMY AND ADENOIDECTOMY    . WISDOM TOOTH EXTRACTION  2017     A IV Location/Drains/Wounds Patient Lines/Drains/Airways Status   Active Line/Drains/Airways    Name:   Placement date:   Placement time:   Site:   Days:   Peripheral IV 12/19/18 Left Antecubital   12/19/18    --    Antecubital   less than 1          Intake/Output Last 24 hours No intake or output data in the 24 hours ending 12/19/18 1951  Labs/Imaging Results for orders placed or performed during the hospital encounter of 12/19/18 (from the past 48 hour(s))  CBG monitoring, ED     Status: Abnormal   Collection Time: 12/19/18 12:19 AM  Result Value Ref Range   Glucose-Capillary 394 (H) 70 - 99 mg/dL  CBC     Status: Abnormal   Collection Time: 12/19/18 12:20 AM  Result Value Ref Range   WBC 13.7 (H) 4.0 -  10.5 K/uL   RBC 4.61 3.87 - 5.11 MIL/uL   Hemoglobin 12.5 12.0 - 15.0 g/dL   HCT 09.838.2 11.936.0 - 14.746.0 %   MCV 82.9 80.0 - 100.0 fL   MCH 27.1 26.0 - 34.0 pg   MCHC 32.7 30.0 - 36.0 g/dL   RDW 82.913.1 56.211.5 - 13.015.5 %   Platelets 319 150 - 400 K/uL   nRBC 0.0 0.0 - 0.2 %    Comment: Performed at [facility], [address] [place], [place] [address]  Troponin I (High Sensitivity)     Status: None   Collection Time: 12/19/18 12:20 AM  Result Value Ref Range   Troponin I (High Sensitivity) <2 <18 ng/L    Comment: Performed at [facility], [address] [place], [place] [address]  Comprehensive metabolic panel     Status:  Abnormal   Collection Time: 12/19/18 12:20 AM  Result Value Ref Range   Sodium 136 135 - 145 mmol/L   Potassium 4.2 3.5 - 5.1 mmol/L   Chloride 99 98 - 111 mmol/L   CO2 23 22 - 32 mmol/L   Glucose, Bld 440 (H) 70 - 99 mg/dL   BUN 13 6 - 20 mg/dL   Creatinine, Ser 9.520.81 0.44 - 1.00 mg/dL   Calcium 9.5 8.9 - 84.110.3 mg/dL   Total Protein 6.9 6.5 - 8.1 g/dL   Albumin 4.2 3.5 - 5.0 g/dL   AST 11 (L) 15 - 41 U/L   ALT 16 0 - 44 U/L   Alkaline Phosphatase 86 38 - 126 U/L   Total Bilirubin 0.4 0.3 - 1.2 mg/dL   GFR calc non Af Amer >60 >60 mL/min   GFR calc Af Amer >60 >60 mL/min   Anion gap 14 5 - 15    Comment: Performed at [facility], [address] [place], [place] [address]  Lipase, blood     Status: None   Collection Time: 12/19/18 12:20 AM  Result Value Ref Range   Lipase 31 11 - 51 U/L    Comment: Performed at [facility], [address] [place], [place] [address]  TSH     Status: Abnormal   Collection Time: 12/19/18 12:20 AM  Result Value Ref Range   TSH 0.237 (L) 0.350 - 4.500 uIU/mL    Comment: Performed by a 3rd Generation assay with a functional sensitivity of <=0.01 uIU/mL. Performed at [facility], [address] [place], [place] [address]   I-Stat beta hCG blood, ED     Status: None   Collection Time: 12/19/18 12:28 AM  Result Value Ref Range   I-stat hCG, quantitative <5.0 <5 mIU/mL   Comment 3            Comment:   GEST. AGE      CONC.  (mIU/mL)   <=1 WEEK        5 - 50     2 WEEKS       50 - 500     3 WEEKS       100 - 10,000     4 WEEKS     1,000 - 30,000        FEMALE AND NON-PREGNANT FEMALE:     LESS THAN 5 mIU/mL   Troponin I (High Sensitivity)     Status: None   Collection Time: 12/19/18  9:17 AM  Result Value Ref Range   Troponin I (High Sensitivity) <2 <  18 ng/L    Comment: Performed at [facility], [address] [place], [place] [address]  POCT I-Stat EG7     Status: Abnormal   Collection Time: 12/19/18  9:21 AM   Result Value Ref Range   pH, Ven 7.318 7.250 - 7.430   pCO2, Ven 46.4 44.0 - 60.0 mmHg   pO2, Ven 43.0 32.0 - 45.0 mmHg   Bicarbonate 23.8 20.0 - 28.0 mmol/L   TCO2 25 22 - 32 mmol/L   O2 Saturation 74.0 %   Acid-base deficit 3.0 (H) 0.0 - 2.0 mmol/L   Sodium 139 135 - 145 mmol/L   Potassium 4.8 3.5 - 5.1 mmol/L   Calcium, Ion 1.25 1.15 - 1.40 mmol/L   HCT 43.0 36.0 - 46.0 %   Hemoglobin 14.6 12.0 - 15.0 g/dL   Patient temperature HIDE    Sample type VENOUS   POC CBG, ED     Status: Abnormal   Collection Time: 12/19/18 10:20 AM  Result Value Ref Range   Glucose-Capillary 348 (H) 70 - 99 mg/dL  Lactic acid, plasma     Status: None   Collection Time: 12/19/18 10:26 AM  Result Value Ref Range   Lactic Acid, Venous 1.7 0.5 - 1.9 mmol/L    Comment: Performed at [facility], [address] [place], [place] [address]  D-dimer, quantitative     Status: None   Collection Time: 12/19/18 10:26 AM  Result Value Ref Range   D-Dimer, Quant <0.27 0.00 - 0.50 ug/mL-FEU    Comment: (NOTE) At the manufacturer cut-off of 0.50 ug/mL FEU, this assay has been documented to exclude PE with a sensitivity and negative predictive value of 97 to 99%.  At this time, this assay has not been approved by the FDA to exclude DVT/VTE. Results should be correlated with clinical presentation. Performed at [facility], [address], [place], [place] [address]   CBG monitoring, ED     Status: Abnormal   Collection Time: 12/19/18 12:15 PM  Result Value Ref Range   Glucose-Capillary 232 (H) 70 - 99 mg/dL  Urinalysis, Routine w reflex microscopic     Status: Abnormal   Collection Time: 12/19/18 12:44 PM  Result Value Ref Range   Color, Urine STRAW (A) YELLOW   APPearance CLEAR CLEAR   Specific Gravity, Urine >1.046 (H) 1.005 - 1.030   pH 5.0 5.0 - 8.0   Glucose, UA >=500 (A) NEGATIVE mg/dL   Hgb urine dipstick NEGATIVE NEGATIVE   Bilirubin Urine NEGATIVE NEGATIVE   Ketones, ur 80 (A)  NEGATIVE mg/dL   Protein, ur NEGATIVE NEGATIVE mg/dL   Nitrite NEGATIVE NEGATIVE   Leukocytes,Ua NEGATIVE NEGATIVE   RBC / HPF 0-5 0 - 5 RBC/hpf   WBC, UA 0-5 0 - 5 WBC/hpf   Bacteria, UA NONE SEEN NONE SEEN   Squamous Epithelial / LPF 0-5 0 - 5    Comment: Performed at [facility], [address], [place], [place] [address]  Urine rapid drug screen (hosp performed)     Status: Abnormal   Collection Time: 12/19/18 12:44 PM  Result Value Ref Range   Opiates NONE DETECTED NONE DETECTED   Cocaine NONE DETECTED NONE DETECTED   Benzodiazepines NONE DETECTED NONE DETECTED   Amphetamines POSITIVE (A) NONE DETECTED   Tetrahydrocannabinol NONE DETECTED NONE DETECTED   Barbiturates NONE DETECTED NONE DETECTED    Comment: (NOTE) DRUG SCREEN FOR MEDICAL PURPOSES ONLY.  IF CONFIRMATION IS  NEEDED FOR ANY PURPOSE, NOTIFY LAB WITHIN 5 DAYS. LOWEST DETECTABLE LIMITS FOR URINE DRUG SCREEN Drug Class                     Cutoff (ng/mL) Amphetamine and metabolites    1000 Barbiturate and metabolites    200 Benzodiazepine                 200 Tricyclics and metabolites     300 Opiates and metabolites        300 Cocaine and metabolites        300 THC                            50 Performed at [facility], [address] [name], [place] [address]   CBG monitoring, ED     Status: Abnormal   Collection Time: 12/19/18  1:38 PM  Result Value Ref Range   Glucose-Capillary 166 (H) 70 - 99 mg/dL  CBG monitoring, ED     Status: Abnormal   Collection Time: 12/19/18  2:39 PM  Result Value Ref Range   Glucose-Capillary 166 (H) 70 - 99 mg/dL  CBG monitoring, ED     Status: Abnormal   Collection Time: 12/19/18  4:10 PM  Result Value Ref Range   Glucose-Capillary 173 (H) 70 - 99 mg/dL  Magnesium     Status: None   Collection Time: 12/19/18  5:25 PM  Result Value Ref Range   Magnesium 2.1 1.7 - 2.4 mg/dL    Comment: Performed at [facility], [address], [place], [place]  [address]  Phosphorus     Status: None   Collection Time: 12/19/18  5:25 PM  Result Value Ref Range   Phosphorus 3.6 2.5 - 4.6 mg/dL    Comment: Performed at [facility], [name], [place], [place] [address]  Ethanol     Status: None   Collection Time: 12/19/18  5:25 PM  Result Value Ref Range   Alcohol, Ethyl (B) <10 <10 mg/dL    Comment: (NOTE) Lowest detectable limit for serum alcohol is 10 mg/dL. For medical purposes only. Performed at [facility], [address] [place], [place] [address]   CBG monitoring, ED     Status: Abnormal   Collection Time: 12/19/18  5:30 PM  Result Value Ref Range   Glucose-Capillary 158 (H) 70 - 99 mg/dL  CBG monitoring, ED     Status: Abnormal   Collection Time: 12/19/18  6:22 PM  Result Value Ref Range   Glucose-Capillary 159 (H) 70 - 99 mg/dL   DG Chest 2 View  Result Date: 12/19/2018 CLINICAL DATA:  Chest pain EXAM: CHEST - 2 VIEW COMPARISON:  11/07/2018 FINDINGS: Shallow lung inflation. Normal cardiomediastinal contours. No focal airspace consolidation or pulmonary edema. No pleural effusion or pneumothorax. IMPRESSION: No active cardiopulmonary disease. Electronically Signed   By: Deatra Robinson M.D.   On: 12/19/2018 00:42   CT Abdomen Pelvis W Contrast  Result Date: 12/19/2018 CLINICAL DATA:  Back pain.  Flank pain. EXAM: CT ABDOMEN AND PELVIS WITH CONTRAST TECHNIQUE: Multidetector CT imaging of the abdomen and pelvis was performed using the standard protocol following bolus administration of intravenous contrast. CONTRAST:  OMNIPAQUE IOHEXOL 300 MG/ML  SOLN COMPARISON:  CT 10/08/2018 FINDINGS: Patient scanned in nonstandard position due to back pain Lower chest: Lung bases are clear. Hepatobiliary: No focal hepatic  lesion. No biliary duct dilatation. Gallbladder is normal. Common bile duct is normal. Pancreas: Pancreas is normal. No ductal dilatation. No pancreatic inflammation. Spleen: Normal spleen Adrenals/urinary  tract: Adrenal glands and kidneys are normal. The ureters and bladder normal. Bladder mildly distended. Stomach/Bowel: Stomach, small bowel, appendix, and cecum are normal. The colon and rectosigmoid colon are normal. Vascular/Lymphatic: Abdominal aorta is normal caliber. No periportal or retroperitoneal adenopathy. No pelvic adenopathy. Reproductive: Uterus and adnexa normal. Other: No free fluid or free air. Musculoskeletal: No aggressive osseous lesion. IMPRESSION: 1. No explanation for back pain.  No obstructive uropathy. 2. Normal appendix. 3. Bladder is distended. Electronically Signed   By: Genevive Bi M.D.   On: 12/19/2018 11:35    Pending Labs Unresulted Labs (From admission, onward)    Start     Ordered   12/26/18 0500  Creatinine, serum  (enoxaparin (LOVENOX)    CrCl >/= 30 ml/min)  Weekly,   R    Comments: while on enoxaparin therapy    12/19/18 1439   12/20/18 0500  Comprehensive metabolic panel  Tomorrow morning,   R     12/19/18 1439   12/20/18 0500  CBC  Tomorrow morning,   R     12/19/18 1439   12/19/18 1440  Culture, blood (routine x 2)  BLOOD CULTURE X 2,   R (with STAT occurrences)     12/19/18 1440   12/19/18 1309  SARS CORONAVIRUS 2 (TAT 6-24 HRS) Nasopharyngeal Nasopharyngeal Swab  (Tier 3 (TAT 6-24 hrs))  Once,   STAT    Question Answer Comment  Is this test for diagnosis or screening Screening   Symptomatic for COVID-19 as defined by CDC No   Hospitalized for COVID-19 No   Admitted to ICU for COVID-19 No   Previously tested for COVID-19 Yes   Resident in a congregate (group) care setting No   Employed in healthcare setting No   Pregnant No      12/19/18 1308   12/19/18 0714  Levetiracetam level  Once,   STAT     12/19/18 0715   12/19/18 0714  Lamotrigine level  Once,   STAT     12/19/18 0715          Vitals/Pain Today's Vitals   12/19/18 1600 12/19/18 1700 12/19/18 1800 12/19/18 1900  BP: 118/76 122/82 117/71 112/72  Pulse: (!) 117 (!) 108 (!)  110 (!) 104  Resp:   (!) 22 (!) 21  Temp:      TempSrc:      SpO2: 100% 100% 100% 98%  Weight:      Height:      PainSc:        Isolation Precautions No active isolations  Medications Medications  sodium chloride flush (NS) 0.9 % injection 3 mL (3 mLs Intravenous Not Given 12/19/18 1046)  insulin regular, human (MYXREDLIN) 100 units/ 100 mL infusion (3 Units/hr Intravenous Rate/Dose Change 12/19/18 1829)  0.9 %  sodium chloride infusion ( Intravenous Not Given 12/19/18 1642)  dextrose 5 %-0.45 % sodium chloride infusion ( Intravenous New Bag/Given 12/19/18 1220)  dextrose 50 % solution 0-50 mL (has no administration in time range)  enoxaparin (LOVENOX) injection 40 mg (has no administration in time range)  0.9 %  sodium chloride infusion (has no administration in time range)  acetaminophen (TYLENOL) tablet 650 mg (has no administration in time range)    Or  acetaminophen (TYLENOL) suppository 650 mg (has no administration in time range)  ondansetron (ZOFRAN)  tablet 4 mg (has no administration in time range)    Or  ondansetron (ZOFRAN) injection 4 mg (has no administration in time range)  HYDROmorphone (DILAUDID) injection 0.5 mg (has no administration in time range)  pantoprazole (PROTONIX) EC tablet 40 mg (40 mg Oral Given 12/19/18 1711)  atorvastatin (LIPITOR) tablet 20 mg (20 mg Oral Given 12/19/18 1711)  propranolol (INDERAL) tablet 20 mg (has no administration in time range)  busPIRone (BUSPAR) tablet 10 mg (10 mg Oral Given 12/19/18 1712)  hydrOXYzine (ATARAX/VISTARIL) tablet 50 mg (has no administration in time range)  mirtazapine (REMERON) tablet 7.5 mg (has no administration in time range)  prochlorperazine (COMPAZINE) suppository 25 mg (has no administration in time range)  QUEtiapine (SEROQUEL) tablet 400 mg (has no administration in time range)  metoCLOPramide (REGLAN) tablet 10 mg (10 mg Oral Given 12/19/18 1908)  lamoTRIgine (LAMICTAL) tablet 50 mg (has no  administration in time range)  topiramate (TOPAMAX) tablet 25 mg (25 mg Oral Given 12/19/18 1712)  sodium chloride 0.9 % bolus 1,000 mL (0 mLs Intravenous Stopped 12/19/18 1046)  metoCLOPramide (REGLAN) injection 10 mg (10 mg Intravenous Given 12/19/18 0837)  insulin aspart (novoLOG) injection 10 Units (10 Units Subcutaneous Given 12/19/18 0838)  sodium chloride 0.9 % bolus 1,000 mL (0 mLs Intravenous Stopped 12/19/18 1222)  iohexol (OMNIPAQUE) 300 MG/ML solution 100 mL (100 mLs Intravenous Contrast Given 12/19/18 1059)    Mobility walks Moderate fall risk   Focused Assessments     R Recommendations: See Admitting Provider Note  Report given to:   Additional Notes:

## 2018-12-19 NOTE — ED Notes (Signed)
Per previous RN, unsuccesful attempts to collect cultures were made by both RN and tech

## 2018-12-19 NOTE — ED Notes (Signed)
Pt refusing to give urine sample at this time

## 2018-12-19 NOTE — ED Notes (Signed)
This RN attempted to call report to floor nurse

## 2018-12-19 NOTE — ED Provider Notes (Signed)
[facility]   CSN: [id number] Arrival date & time: 12/18/18  2359     History Chief Complaint  Patient presents with  . Multiple Complaints    Nancy ParadiseRashonda N Vessels is a 18 y.o. 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 who presents to the ED via EMS for multiple complaints.   Patient currently complaining of gradual onset, constant, achy, diffuse abdominal pain x1 to 2 weeks.  He is also complaining of nausea and nonbloody nonbilious emesis.  States that she has been taking her home medications without relief.  Patient states that she has been compliant with her insulin despite not eating or drinking much due to vomiting.  States she is having greater than 10 episodes of vomiting per day.   She states that she has also been having chest pain although states that this is normal for her with her history of "congestive heart failure." Per chart review ECHO 12/01/2018 with EF 65-70%. Pt denies fever, chills, leg swelling, palpitations, diarrhea, constipation, urinary sx, or any other associated symptoms. No recent prolonged travel or immobilization. No hx DVT/PE. No exogenous hormone use. No active malignancy.   She also reports that she had a seizure earlier today prior to arrival to the ED.  States that the most recent 1 prior to this was 2 days ago states that before that she was having multiple seizures per day.  Patient states she is currently taking Keppra and Lamictal and states she is compliant.  There has been concern for pseudoseizures in the past.  States that she was being seen by the [facility] neurolog[facility] was states her provider is retiring and she needs to find a new neurologist.  Does not appear that she has seen neurology in quite some time per chart review.  It does appear patient has had multiple ED visits in the past for similar complaints.  28 visits in the past 6 months.   The  history is provided by the patient and medical records.       Past Medical History:  Diagnosis Date  . Acanthosis nigricans   . Anxiety   . CHF (congestive heart failure) (HCC)   . Chronic lower back pain   . Depression   . DKA, type 1 (HCC) 09/13/2018  . Dyspepsia   . Obesity   . Ovarian cyst    pt is not aware of this hx (11/24/2017)  . Pre-diabetes   . 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 Premature baby   . Seizures (HCC)   . Type II diabetes mellitus (HCC)    insulin dependant    Patient Active Problem List   Diagnosis Date Noted  . Leukocytosis 12/19/2018  . Dehydration   . Pseudoseizures   . Uncontrolled diabetes mellitus (HCC) 11/28/2018  . Uncontrolled diabetes mellitus with hyperglycemia (HCC) 11/27/2018  . 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  . MDD (major depressive disorder), recurrent episode, severe (HCC) 11/06/2018  . Nonspecific abnormal electrocardiogram (ECG) (EKG)   . Chest pain of uncertain etiology   . Hypertensive urgency 10/08/2018  . Conversion disorder with attacks or seizures, acute episode, with psychological stressor 09/16/2018  . MDD (major depressive disorder), recurrent severe, without psychosis (HCC)   . AKI (acute kidney injury) (HCC) 08/04/2018  . Seizures (HCC) 08/03/2018  . Depression 07/25/2018  . Syncope 01/30/2018  . Orthostatic hypotension 01/24/2018  . Tachycardia 12/28/2017  . Chronic abdominal pain 12/24/2017  .  Chest pain 12/19/2017  . Nausea and vomiting 08/21/2017  . Generalized abdominal pain 08/21/2017  . Non compliance with medical treatment 01/27/2012  . Adjustment disorder 09/16/2011  . Acanthosis nigricans   . Goiter   . Obesity 06/14/2010  . Hypertension 06/14/2010    Past Surgical History:  Procedure Laterality Date  . ABDOMINAL HERNIA REPAIR     "I was a baby"  . BIOPSY  10/12/2018   Procedure: BIOPSY;  Surgeon: Lynann Bologna, MD;  Location: [facility] ENDOSCOPY;  Service:  Endoscopy;;  . ESOPHAGOGASTRODUODENOSCOPY (EGD) WITH PROPOFOL N/A 10/12/2018   Procedure: ESOPHAGOGASTRODUODENOSCOPY (EGD) WITH PROPOFOL;  Surgeon: Lynann Bologna, MD;  Location: [facility] ENDOSCOPY;  Service: Endoscopy;  Laterality: N/A;  . HERNIA REPAIR    . LEFT HEART CATH AND CORONARY ANGIOGRAPHY N/A 10/13/2018   Procedure: LEFT HEART CATH AND CORONARY ANGIOGRAPHY;  Surgeon: Kathleene Hazel, MD;  Location: [facility] INVASIVE CV LAB;  Service: Cardiovascular;  Laterality: N/A;  . TONSILLECTOMY AND ADENOIDECTOMY    . WISDOM TOOTH EXTRACTION  2017     OB History   No obstetric history on file.     Family History  Problem Relation Age of Onset  . Diabetes Mother   . Hypertension Mother   . Obesity Mother   . Asthma Mother   . Allergic rhinitis Mother   . Eczema Mother   . Cervical cancer Mother   . Diabetes Father   . Hypertension Father   . Obesity Father   . Hyperlipidemia Father   . Hypertension Paternal Aunt   . Hypertension Maternal Grandfather   . Colon cancer Maternal Grandfather   . Diabetes Paternal Grandmother   . Obesity Paternal Grandmother   . Diabetes Paternal Grandfather   . Obesity Paternal Grandfather   . Angioedema Neg Hx   . Immunodeficiency Neg Hx   . Urticaria Neg Hx   . Stomach cancer Neg Hx   . Esophageal cancer Neg Hx     Social History   Tobacco Use  . Smoking status: Never Smoker  . Smokeless tobacco: Never Used  Substance Use Topics  . Alcohol use: No    Alcohol/week: 0.0 standard drinks  . Drug use: No    Home Medications Prior to Admission medications   Medication Sig Start Date End Date Taking? Authorizing Provider  atorvastatin (LIPITOR) 20 MG tablet Take 1 tablet (20 mg total) by mouth daily at 6 PM. 10/05/18   Storm Frisk, MD  busPIRone (BUSPAR) 10 MG tablet Take 10 mg by mouth 3 (three) times daily. 11/19/18   [provider]  dicyclomine (BENTYL) 10 MG capsule Take 10 mg by mouth 3 (three) times daily as needed (for  abdominal pain).    [provider]  escitalopram (LEXAPRO) 20 MG tablet Take 20 mg by mouth daily.    [provider]  FLUoxetine (PROZAC) 40 MG capsule Take 40 mg by mouth daily.    [provider]  hydrOXYzine (ATARAX/VISTARIL) 50 MG tablet Take 1 tablet (50 mg total) by mouth 3 (three) times daily as needed for anxiety. Patient taking differently: Take 50 mg by mouth 3 (three) times daily.  11/11/18   Armandina Stammer I, NP  insulin glargine (LANTUS) 100 UNIT/ML injection Inject 0.6 mLs (60 Units total) into the skin at bedtime. For diabetes management 11/11/18   Armandina Stammer I, NP  insulin lispro (HUMALOG) 100 UNIT/ML injection Inject 0.15 mLs (15 Units total) into the skin 2 (two) times daily with a meal. 10/05/18  Storm FriskWright, Patrick E, MD  lamoTRIgine (LAMICTAL) 25 MG tablet Take 2 tablets (50 mg total) by mouth 2 (two) times daily. For mood stabilization 11/11/18   Armandina StammerNwoko, Agnes I, NP  lubiprostone (AMITIZA) 24 MCG capsule Take 1 capsule (24 mcg total) by mouth 2 (two) times daily with a meal. 10/27/18   Esterwood, Amy S, PA-C  meloxicam (MOBIC) 7.5 MG tablet Take 7.5 mg by mouth daily as needed for pain.    [provider]  metoCLOPramide (REGLAN) 10 MG tablet Take 1 tablet (10 mg total) by mouth 4 (four) times daily -  before meals and at bedtime. 11/30/18 12/30/18  Jerald Kiefhiu, Stephen K, MD  mirtazapine (REMERON) 7.5 MG tablet Take 1 tablet (7.5 mg total) by mouth at bedtime. For depression/sleep 11/11/18   Armandina StammerNwoko, Agnes I, NP  Multiple Vitamin (MULTIVITAMIN WITH MINERALS) TABS tablet Take 1 tablet by mouth daily. 09/18/18   Clapacs, Jackquline DenmarkJohn T, MD  Olopatadine HCl (PAZEO) 0.7 % SOLN Place 3 drops into both eyes 2 (two) times daily.    [provider]  pantoprazole (PROTONIX) 40 MG tablet Take 1 tablet (40 mg total) by mouth 2 (two) times daily. For acid reflux 11/11/18   Armandina StammerNwoko, Agnes I, NP  prochlorperazine (COMPAZINE) 25 MG suppository Place 1 suppository (25 mg total)  rectally every 12 (twelve) hours as needed for nausea or vomiting. 12/05/18   Melene PlanFloyd, Dan, DO  promethazine (PHENERGAN) 25 MG suppository Place 25 mg rectally every 8 (eight) hours as needed for nausea or vomiting.    [provider]  propranolol (INDERAL) 20 MG tablet Take 1 tablet (20 mg total) by mouth 3 (three) times daily. For anxiety/HTN 11/11/18   Armandina StammerNwoko, Agnes I, NP  QUEtiapine (SEROQUEL) 200 MG tablet Take 400 mg by mouth at bedtime.     [provider]  RESTASIS 0.05 % ophthalmic emulsion Place 1 drop into both eyes 2 (two) times daily. 11/16/18   [provider]  topiramate (TOPAMAX) 25 MG tablet Take 1 tablet (25 mg total) by mouth 2 (two) times daily. For seizure activities 11/11/18   Armandina StammerNwoko, Agnes I, NP    Allergies    Ibuprofen and Oatmeal  Review of Systems   Review of Systems  Constitutional: Negative for chills and fever.  HENT: Negative for congestion.   Eyes: Negative for visual disturbance.  Respiratory: Negative for cough and shortness of breath.   Cardiovascular: Positive for chest pain. Negative for palpitations and leg swelling.  Gastrointestinal: Positive for abdominal pain, nausea and vomiting. Negative for blood in stool, constipation and diarrhea.  Genitourinary: Negative for difficulty urinating.  Musculoskeletal: Negative for myalgias.  Skin: Negative for rash.  Neurological: Negative for headaches.    Physical Exam Updated Vital Signs BP (!) 157/99   Pulse (!) 115   Temp 97.7 F (36.5 C) (Oral)   Resp 20   Ht 5\' 4"  (1.626 m)   Wt 90.7 kg   SpO2 99%   BMI 34.33 kg/m   Physical Exam Vitals and nursing note reviewed.  Constitutional:      Appearance: She is not ill-appearing or diaphoretic.  HENT:     Head: Normocephalic and atraumatic.     Mouth/Throat:     Mouth: Mucous membranes are dry.  Eyes:     Conjunctiva/sclera: Conjunctivae normal.  Cardiovascular:     Rate and Rhythm: Regular rhythm. Tachycardia present.      Pulses: Normal pulses.  Pulmonary:     Effort: Pulmonary effort is normal.  Breath sounds: Normal breath sounds. No wheezing, rhonchi or rales.  Abdominal:     Palpations: Abdomen is soft.     Tenderness: There is no abdominal tenderness. There is no right CVA tenderness, left CVA tenderness, guarding or rebound.  Musculoskeletal:     Cervical back: Neck supple.     Right lower leg: No edema.     Left lower leg: No edema.  Skin:    General: Skin is warm and dry.  Neurological:     Mental Status: She is alert.     ED Results / Procedures / Treatments   Labs (all labs ordered are listed, but only abnormal results are displayed) Labs Reviewed  CBC - Abnormal; Notable for the following components:      Result Value   WBC 13.7 (*)    All other components within normal limits  COMPREHENSIVE METABOLIC PANEL - Abnormal; Notable for the following components:   Glucose, Bld 440 (*)    AST 11 (*)    All other components within normal limits  URINALYSIS, ROUTINE W REFLEX MICROSCOPIC - Abnormal; Notable for the following components:   Color, Urine STRAW (*)    Specific Gravity, Urine >1.046 (*)    Glucose, UA >=500 (*)    Ketones, ur 80 (*)    All other components within normal limits  CBG MONITORING, ED - Abnormal; Notable for the following components:   Glucose-Capillary 394 (*)    All other components within normal limits  POCT I-STAT EG7 - Abnormal; Notable for the following components:   Acid-base deficit 3.0 (*)    All other components within normal limits  CBG MONITORING, ED - Abnormal; Notable for the following components:   Glucose-Capillary 348 (*)    All other components within normal limits  CBG MONITORING, ED - Abnormal; Notable for the following components:   Glucose-Capillary 232 (*)    All other components within normal limits  CBG MONITORING, ED - Abnormal; Notable for the following components:   Glucose-Capillary 166 (*)    All other components within  normal limits  CBG MONITORING, ED - Abnormal; Notable for the following components:   Glucose-Capillary 166 (*)    All other components within normal limits  SARS CORONAVIRUS 2 (TAT 6-24 HRS)  CULTURE, BLOOD (ROUTINE X 2)  CULTURE, BLOOD (ROUTINE X 2)  LIPASE, BLOOD  LACTIC ACID, PLASMA  D-DIMER, QUANTITATIVE (NOT AT [facility])  LEVETIRACETAM LEVEL  LAMOTRIGINE LEVEL  CBC  CREATININE, SERUM  MAGNESIUM  PHOSPHORUS  TSH  RAPID URINE DRUG SCREEN, HOSP PERFORMED  ETHANOL  I-STAT BETA HCG BLOOD, ED ([facility], [facility], [facility] ONLY)  I-STAT VENOUS BLOOD GAS, ED  TROPONIN I (HIGH SENSITIVITY)  TROPONIN I (HIGH SENSITIVITY)    EKG EKG Interpretation  Date/Time:  Sunday December 19 2018 07:41:57 EST Ventricular Rate:  133 PR Interval:    QRS Duration: 83 QT Interval:  324 QTC Calculation: 482 R Axis:   76 Text Interpretation: Sinus tachycardia Borderline prolonged QT interval similar to earlier in the day Confirmed by Pricilla Loveless [phone]) on 12/19/2018 7:48:00 AM   Radiology DG Chest 2 View  Result Date: 12/19/2018 CLINICAL DATA:  Chest pain EXAM: CHEST - 2 VIEW COMPARISON:  11/07/2018 FINDINGS: Shallow lung inflation. Normal cardiomediastinal contours. No focal airspace consolidation or pulmonary edema. No pleural effusion or pneumothorax. IMPRESSION: No active cardiopulmonary disease. Electronically Signed   By: Deatra Robinson M.D.   On: 12/19/2018 00:42   CT Abdomen Pelvis W Contrast  Result Date:  12/19/2018 CLINICAL DATA:  Back pain.  Flank pain. EXAM: CT ABDOMEN AND PELVIS WITH CONTRAST TECHNIQUE: Multidetector CT imaging of the abdomen and pelvis was performed using the standard protocol following bolus administration of intravenous contrast. CONTRAST:  153mL OMNIPAQUE IOHEXOL 300 MG/ML  SOLN COMPARISON:  CT 10/08/2018 FINDINGS: Patient scanned in nonstandard position due to back pain Lower chest: Lung bases are clear. Hepatobiliary: No focal hepatic lesion. No biliary duct dilatation.  Gallbladder is normal. Common bile duct is normal. Pancreas: Pancreas is normal. No ductal dilatation. No pancreatic inflammation. Spleen: Normal spleen Adrenals/urinary tract: Adrenal glands and kidneys are normal. The ureters and bladder normal. Bladder mildly distended. Stomach/Bowel: Stomach, small bowel, appendix, and cecum are normal. The colon and rectosigmoid colon are normal. Vascular/Lymphatic: Abdominal aorta is normal caliber. No periportal or retroperitoneal adenopathy. No pelvic adenopathy. Reproductive: Uterus and adnexa normal. Other: No free fluid or free air. Musculoskeletal: No aggressive osseous lesion. IMPRESSION: 1. No explanation for back pain.  No obstructive uropathy. 2. Normal appendix. 3. Bladder is distended. Electronically Signed   By: Suzy Bouchard M.D.   On: 12/19/2018 11:35    Procedures Procedures (including critical care time)  Medications Ordered in ED Medications  sodium chloride flush (NS) 0.9 % injection 3 mL (3 mLs Intravenous Not Given 12/19/18 1046)  insulin regular, human (MYXREDLIN) 100 units/ 100 mL infusion (3.6 Units/hr Intravenous Rate/Dose Change 12/19/18 1446)  0.9 %  sodium chloride infusion (has no administration in time range)  dextrose 5 %-0.45 % sodium chloride infusion ( Intravenous New Bag/Given 12/19/18 1220)  dextrose 50 % solution 0-50 mL (has no administration in time range)  enoxaparin (LOVENOX) injection 40 mg (has no administration in time range)  0.9 %  sodium chloride infusion (has no administration in time range)  acetaminophen (TYLENOL) tablet 650 mg (has no administration in time range)    Or  acetaminophen (TYLENOL) suppository 650 mg (has no administration in time range)  ondansetron (ZOFRAN) tablet 4 mg (has no administration in time range)    Or  ondansetron (ZOFRAN) injection 4 mg (has no administration in time range)  HYDROmorphone (DILAUDID) injection 0.5 mg (has no administration in time range)  sodium chloride 0.9 %  bolus 1,000 mL (0 mLs Intravenous Stopped 12/19/18 1046)  metoCLOPramide (REGLAN) injection 10 mg (10 mg Intravenous Given 12/19/18 0837)  insulin aspart (novoLOG) injection 10 Units (10 Units Subcutaneous Given 12/19/18 0838)  sodium chloride 0.9 % bolus 1,000 mL (0 mLs Intravenous Stopped 12/19/18 1222)  iohexol (OMNIPAQUE) 300 MG/ML solution 100 mL (100 mLs Intravenous Contrast Given 12/19/18 1059)    ED Course  I have reviewed the triage vital signs and the nursing notes.  Pertinent labs & imaging results that were available during my care of the patient were reviewed by me and considered in my medical decision making (see chart for details).  18 year old female presents the ED today with multiple complaints including nausea, vomiting, abdominal pain with history of gastroparesis uncontrolled at home medications.  Also complaining of chest pain which is not new for her.  And seizure that occurred prior to arrival.  Patient states that she is compliant with her Lamictal and Keppra although there is concern for pseudoseizures with previous ED visits and negative work-up.  Will check Lamictal and Keppra levels today and reevaluate.  Per med rec pt is not currently on Keppra? Has been in the ED for approximately 7 hours prior to being seen due to long wait times, unable to assess if  she had postictal state upon arrival. She is resting comfortably as I enter the room with her face and body covered with the blanket. Noted to be tachycardic on arrival.  She is afebrile however.  With her nausea, vomiting, abdominal pain - has no focal tenderness on exam to abdomen.  Doubt acute abdomen today.  Does appear dry on exam.  Will provide fluids and IV Reglan.  His chest pain appears to be a recurrent issue for her.  Doubt ACS today.  She is PERC negative. Troponin levels drawn prior to being seen.   Lab work was obtained prior to being seen.  CBC with leukocytosis 13,000.  Question of this is related to  infection versus possible seizure-like activity prior to arrival.  Patient is afebrile in the ED today and besides her tachycardia no other signs of infection.  Will reassess once fluids are given, patient may just be dehydrated.  CMP with glucose 440.  Potassium within normal limits at 4.2.  With sodium correction anion gap of 19.  Will obtain VBG at this time to rule out DKA.  Will give 10 units insulin prior to this.  Initial troponin less than 2.  EKG without ischemic changes although sinus tachycardia noted.  X-ray negative.   VBG with normal pH 7.3.  Recheck CBG 348 and patient continues to be tachycardic despite 1 L NS bolus. Will give additional unit and add on d dimer at this time.  Also order CT abdomen and pelvis given complaint of nausea and vomiting and abdominal pain, tachycardia, elevated WBC count and heart rate unchanged with fluids.  Will start on insulin drip.    Lactic acid within normal limits.  D-dimer negative.  CT a/p without any acute findings at this time.  Despite second liter of fluids and insulin drip patient continues to be tachycardic in the 120s.  Urinalysis at this time but likely will admit for further work-up and continuation of IV insulin.  Repeat CBG 232.  Urinalysis with > 500 glucose and increased spec gravity consistent with dehydration but no signs of infection. Will consult hospitalist for admission at this time given continued tachycardia despite intervention with fluids and insulin.   Clinical Course as of Dec 18 1445  Sun Dec 19, 2018  1110 D-Dimer, Quant: <0.27 [MV]  1429 Discussed case with Dr. Jacqulyn Bath who agrees to accept patient for admission   [MV]    Clinical Course User Index [MV] Tanda Rockers, PA-C   MDM Rules/Calculators/A&P  Final Clinical Impression(s) / ED Diagnoses Final diagnoses:  Tachycardia  Dehydration  Gastroparesis    Rx / DC Orders ED Discharge Orders    None       Tanda Rockers, PA-C 12/19/18 1447    Pricilla Loveless, MD 12/22/18 [phone]

## 2018-12-19 NOTE — ED Notes (Signed)
Patient transported to CT 

## 2018-12-19 NOTE — ED Triage Notes (Addendum)
BIB EMS from home. Pt presents with multiple complaints. Reports chest pain, abd pain, states she had a seizure this AM. CBG 315 en route. VSS.

## 2018-12-19 NOTE — ED Notes (Signed)
Pt. Aware urine is needed, states they are unable to provide any at this time.  

## 2018-12-20 DIAGNOSIS — R000 Tachycardia, unspecified: Secondary | ICD-10-CM

## 2018-12-20 LAB — CBC
HCT: 34.4 % — ABNORMAL LOW (ref 36.0–46.0)
Hemoglobin: 11 g/dL — ABNORMAL LOW (ref 12.0–15.0)
MCH: 26.9 pg (ref 26.0–34.0)
MCHC: 32 g/dL (ref 30.0–36.0)
MCV: 84.1 fL (ref 80.0–100.0)
Platelets: 266 10*3/uL (ref 150–400)
RBC: 4.09 MIL/uL (ref 3.87–5.11)
RDW: 13.3 % (ref 11.5–15.5)
WBC: 10.7 10*3/uL — ABNORMAL HIGH (ref 4.0–10.5)
nRBC: 0 % (ref 0.0–0.2)

## 2018-12-20 LAB — COMPREHENSIVE METABOLIC PANEL
ALT: 11 U/L (ref 0–44)
AST: 10 U/L — ABNORMAL LOW (ref 15–41)
Albumin: 3.2 g/dL — ABNORMAL LOW (ref 3.5–5.0)
Alkaline Phosphatase: 65 U/L (ref 38–126)
Anion gap: 10 (ref 5–15)
BUN: 9 mg/dL (ref 6–20)
CO2: 22 mmol/L (ref 22–32)
Calcium: 8.5 mg/dL — ABNORMAL LOW (ref 8.9–10.3)
Chloride: 103 mmol/L (ref 98–111)
Creatinine, Ser: 0.61 mg/dL (ref 0.44–1.00)
GFR calc Af Amer: >60 mL/min (ref >60)
GFR calc non Af Amer: >60 mL/min (ref >60)
Glucose, Bld: 347 mg/dL — ABNORMAL HIGH (ref 70–99)
Potassium: 3.6 mmol/L (ref 3.5–5.1)
Sodium: 135 mmol/L (ref 135–145)
Total Bilirubin: 0.6 mg/dL (ref 0.3–1.2)
Total Protein: 5.5 g/dL — ABNORMAL LOW (ref 6.5–8.1)

## 2018-12-20 LAB — GLUCOSE, CAPILLARY
Glucose-Capillary: 332 mg/dL — ABNORMAL HIGH (ref 70–99)
Glucose-Capillary: 227 mg/dL — ABNORMAL HIGH (ref 70–99)
Glucose-Capillary: 311 mg/dL — ABNORMAL HIGH (ref 70–99)
Glucose-Capillary: 333 mg/dL — ABNORMAL HIGH (ref 70–99)
Glucose-Capillary: 270 mg/dL — ABNORMAL HIGH (ref 70–99)

## 2018-12-20 LAB — LAMOTRIGINE LEVEL: Lamotrigine Lvl: 7.2 ug/mL (ref 2.0–20.0)

## 2018-12-20 LAB — TROPONIN I (HIGH SENSITIVITY): Troponin I (High Sensitivity): 3 ng/L (ref <18)

## 2018-12-20 MED ORDER — INSULIN GLARGINE 100 UNIT/ML ~~LOC~~ SOLN
60.0000 [IU] | Freq: Every day | SUBCUTANEOUS | Status: DC
  Administered 2018-12-20 – 2018-12-22 (×3): 60 [IU] via SUBCUTANEOUS
  Filled 2018-12-20 (×4): qty 0.6

## 2018-12-20 MED ORDER — INSULIN GLARGINE 100 UNIT/ML ~~LOC~~ SOLN
20.0000 [IU] | Freq: Once | SUBCUTANEOUS | Status: AC
  Administered 2018-12-20: 20 [IU] via SUBCUTANEOUS
  Filled 2018-12-20: qty 0.2

## 2018-12-20 MED ORDER — INSULIN ASPART 100 UNIT/ML ~~LOC~~ SOLN
10.0000 [IU] | Freq: Three times a day (TID) | SUBCUTANEOUS | Status: DC
  Administered 2018-12-20: 10 [IU] via SUBCUTANEOUS

## 2018-12-20 NOTE — Evaluation (Signed)
Physical Therapy Evaluation Patient Details Name: Nancy [name] MRN: [id number] DOB: 03/20/2000 Today's Date: 12/20/2018   History of Present Illness  18yo female c/o nausea/vomiting, abdominal pain, chest pain x1 week, and recent history of passing out as well as a seizure that lasted 10 minutes. Admitted due to uncontrolled DM, CP, nausea/vomiting, and seizure disorder. PMH DM, seizures, obesity, LBP, CHF, anxiety, cardiac cath  Clinical Impression   Patient received in bed, A&Ox4 and very pleasant, quite cooperative with PT today. Able to complete all functional bed mobility, transfers, gait approximately 142f with no device and Mod(I)-I today. She reports she is at her functional baseline and has no concerns about her overall mobility, strength, or balance. She was left sitting at EOB with breakfast tray ready, all needs otherwise met this morning. She is not in need of skilled PT services at this point, however do recommend working with mobility tech during her hospital stay. PT signing off- thank you for the referral.     Follow Up Recommendations No PT follow up    Equipment Recommendations  None recommended by PT    Recommendations for Other Services Other (comment)(mobility tech)     Precautions / Restrictions Precautions Precautions: None Restrictions Weight Bearing Restrictions: No      Mobility  Bed Mobility Overal bed mobility: Independent             General bed mobility comments: distant S  Transfers Overall transfer level: Independent Equipment used: None             General transfer comment: distant S, no cues or physical assist given; able to don sandals and pants at EOB without difficulty  Ambulation/Gait Ambulation/Gait assistance: Independent Gait Distance (Feet): 160 Feet Assistive device: None Gait Pattern/deviations: WFL(Within Functional Limits);Step-through pattern Gait velocity: decreased   General Gait Details: gait slow but  steady with no significant functional impairments, she reports she is generally at her functional baseline  Stairs            Wheelchair Mobility    Modified Rankin (Stroke Patients Only)       Balance Overall balance assessment: Independent                                           Pertinent Vitals/Pain Pain Assessment: 0-10 Pain Score: 9  Pain Location: chest Pain Descriptors / Indicators: Pressure;Sharp Pain Intervention(s): Limited activity within patient's tolerance;Monitored during session    Home Living Family/patient expects to be discharged to:: Private residence Living Arrangements: Parent;Other (Comment)(and girlfriend) Available Help at Discharge: Family;Available 24 hours/day Type of Home: Apartment Home Access: Stairs to enter Entrance Stairs-Rails: None Entrance Stairs-Number of Steps: lives on third floor Home Layout: One level Home Equipment: None      Prior Function Level of Independence: Independent               Hand Dominance        Extremity/Trunk Assessment   Upper Extremity Assessment Upper Extremity Assessment: Overall WFL for tasks assessed    Lower Extremity Assessment Lower Extremity Assessment: Overall WFL for tasks assessed    Cervical / Trunk Assessment Cervical / Trunk Assessment: Normal  Communication   Communication: No difficulties  Cognition Arousal/Alertness: Awake/alert Behavior During Therapy: WFL for tasks assessed/performed Overall Cognitive Status: Within Functional Limits for tasks assessed  General Comments: very pleasant and cooperative      General Comments      Exercises     Assessment/Plan    PT Assessment Patent does not need any further PT services  PT Problem List         PT Treatment Interventions      PT Goals (Current goals can be found in the Care Plan section)  Acute Rehab PT Goals Patient Stated Goal: feel  better, less pain and go home PT Goal Formulation: With patient Time For Goal Achievement: 01/03/19 Potential to Achieve Goals: Good    Frequency     Barriers to discharge        Co-evaluation               AM-PAC PT "6 Clicks" Mobility  Outcome Measure Help needed turning from your back to your side while in a flat bed without using bedrails?: None Help needed moving from lying on your back to sitting on the side of a flat bed without using bedrails?: None Help needed moving to and from a bed to a chair (including a wheelchair)?: None Help needed standing up from a chair using your arms (e.g., wheelchair or bedside chair)?: None Help needed to walk in hospital room?: None Help needed climbing 3-5 steps with a railing? : A Little 6 Click Score: 23    End of Session   Activity Tolerance: Patient tolerated treatment well Patient left: in bed;with call bell/phone within reach   PT Visit Diagnosis: Pain Pain - Right/Left: (chest) Pain - part of body: (chest)    Time: 3532-9924 PT Time Calculation (min) (ACUTE ONLY): 10 min   Charges:   PT Evaluation $PT Eval Moderate Complexity: 1 Mod          Windell Norfolk, DPT, PN1   Supplemental Physical Therapist [place]    Pager [phone] Acute Rehab Office [phone]

## 2018-12-20 NOTE — Evaluation (Signed)
Occupational Therapy Evaluation Patient Details Name: Nancy [name] MRN: [id number] DOB: [birth year] Today's Date: 12/20/2018    History of Present Illness 18yo female c/o nausea/vomiting, abdominal pain, chest pain x1 week, and recent history of passing out as well as a seizure that lasted 10 minutes. Admitted due to uncontrolled DM, CP, nausea/vomiting, and seizure disorder. PMH DM, seizures, obesity, LBP, CHF, anxiety, cardiac cath. Per chart review patient has been admitted approx 29 times in past 6 months   Clinical Impression   Patient is a 18 year old female that lives in an apartment with her mom. Patient is independent at baseline with self care and functional mobility, reports spells of dizziness and decreased balance. Currently patient independent with self care and functional ambulation without AD or physical assist. Addressed patient's concerns/questions regarding her mobility at home to best of this OT's knowledge. Will discontinue acute OT services at this time.     Follow Up Recommendations  No OT follow up    Equipment Recommendations  Other (comment)(spoke with patient where to obtain rw if needed)       Precautions / Restrictions Precautions Precautions: None Restrictions Weight Bearing Restrictions: No      Mobility Bed Mobility Overal bed mobility: Independent               Transfers Overall transfer level: Independent Equipment used: None                 Balance Overall balance assessment: Independent                                         ADL either performed or assessed with clinical judgement   ADL Overall ADL's : Independent                                       General ADL Comments: patient able to perform LB dressing, functional transfers and ambulation without assistance                  Pertinent Vitals/Pain Pain Assessment: Faces Pain Score: 9  Faces Pain Scale: Hurts little  more Pain Location: abdomen Pain Descriptors / Indicators: Aching Pain Intervention(s): Limited activity within patient's tolerance;Monitored during session     Hand Dominance Right   Extremity/Trunk Assessment Upper Extremity Assessment Upper Extremity Assessment: Overall WFL for tasks assessed   Lower Extremity Assessment Lower Extremity Assessment: Defer to PT evaluation   Cervical / Trunk Assessment Cervical / Trunk Assessment: Normal   Communication Communication Communication: No difficulties   Cognition Arousal/Alertness: Awake/alert Behavior During Therapy: WFL for tasks assessed/performed Overall Cognitive Status: Within Functional Limits for tasks assessed                                 General Comments: very pleasant and cooperative   General Comments  patient reports her main concern physically is that she feels her balance is being affected by her medications to the point where she has to grab onto walls or hold her mom's arm. educate patient where to obtain walker if she feels her balance becomes heavily impaired. patient reports she could also her her mom's cane as her mother doesn't use it.  Home Living Family/patient expects to be discharged to:: Private residence Living Arrangements: Parent Available Help at Discharge: Family;Available 24 hours/day Type of Home: Apartment Home Access: Stairs to enter [employer] of Steps: lives on third floor Entrance Stairs-Rails: None Home Layout: One level     Bathroom Shower/Tub: Chief Strategy Officer: Standard     Home Equipment: [employer] - single point          Prior Functioning/Environment Level of Independence: Independent                 OT Problem List: Decreased activity tolerance          AM-PAC OT "6 Clicks" Daily Activity     Outcome Measure Help from another person eating meals?: None Help from another person taking care of personal  grooming?: None Help from another person toileting, which includes using toliet, bedpan, or urinal?: None Help from another person bathing (including washing, rinsing, drying)?: None Help from another person to put on and taking off regular upper body clothing?: None Help from another person to put on and taking off regular lower body clothing?: None 6 Click Score: 24   End of Session Nurse Communication: Mobility status  Activity Tolerance: Patient tolerated treatment well Patient left: in bed;with call bell/phone within reach  OT Visit Diagnosis: Pain Pain - part of body: (abdomen)                Time: 1023-1040 OT Time Calculation (min): 17 min Charges:  OT General Charges $OT Visit: 1 Visit OT Evaluation $OT Eval Moderate Complexity: 1 Mod  Myrtie Neither OT OT office: [phone]  Carmelia Roller 12/20/2018, 11:43 AM

## 2018-12-20 NOTE — Progress Notes (Signed)
PROGRESS NOTE    Nancy [name]  XBJ:[id number] DOB: 2000/03/02 DOA: 12/19/2018 PCP: Claiborne Rigg, NP   Brief Narrative:  HPI on 12/19/2018 by Dr. Ardean Larsen Nancy [name] is a 18 y.o. female with medical history significant of uncontrolled type 1 diabetes mellitus, hypertension, hyperlipidemia, GERD, obesity, depression with anxiety, seizure/pseudoseizure disorder presents to emergency department with multiple medical issues.  She tells me that she has nausea, vomiting, abdominal pain, chest pain since 1 week.  Her abdominal pain is severe, generalized, nonradiating, no aggravating or relieving factors, associated with nausea and vomiting, could not keep anything down.  Reports severe chest pain midsternal, nonradiating, associated with shortness of breath.  Reports that she passed out 4 times this morning and had seizure which lasted for 10 minutes before arrival to the emergency department.  She tells me that she peed on herself when she had seizure.  She tells me that she has headache, blurry vision, lightheadedness, dizziness however denies fever, chills, cough, congestion, urinary symptoms such as dysuria, hematuria, foul-smelling urine, palpitation, leg swelling, orthopnea, PND, decreased appetite or sleep changes.  She tells me that although she continues to have nausea and vomiting and abdominal pain she is compliant with her insulin regime and her antiepileptic medication.  She forgot to take Lantus last night and this morning she took her 60 units of Lantus.  History of multiple admission last month with similar symptoms.  She tells me that she hads to find a new neurologist for her seizure disorder as her previous neurologist is retired.  Work-up on previous admission including MRI, EEG, carotid Doppler, transthoracic echo all were negative.  Assessment & Plan   Uncontrolled diabetes mitis, type II with hyperglycemia/HHNK -Presented with a blood glucose of 394,  however on BMP glucose was noted to be 440 -Anion gap was 14 -Patient was initially placed on insulin drip -Suspect secondary to noncompliance, patient does tell me that she has been taking her Lantus however holds her NovoLog when she is not eating -Last hemoglobin A1c was 11.3 on 12/19/2018 -Patient transition to Lantus, home dose of 60 along with insulin sliding scale and NovoLog 10 units per meal   Nausea, vomiting, generalized abdominal pain -Unknown etiology, question gastroparesis -UA and chest x-ray unremarkable for infection -CT abdomen pelvisNegative for acute finding -Lipase and lactic acid within normal limits -Continue antiemetics and IV fluids as needed -Patient feels her nausea is getting better and would like to eat -Have placed on carb modified diet, will continue to monitor  Chest pain -Unknown etiology, may be secondary to the above -Troponin unremarkable x2.  EKG showed no ST elevation or depression -Patient did have cardiac catheterization on 10/13/2018 which is unremarkable -Will continue to monitor  Syncope/seizure/pseudoseizure -Patient had work-up on 12/01/2018 with carotid ultrasound which was unremarkable, echocardiogram showed a preserved EF with normal systolic and diastolic function.  MRI showed no acute findings.  EEG was also normal. -Continue Neurontin, Topamax, Lamictal -Continue seizure precautions -Patient is to follow-up with neurology as an outpatient  Dehydration -Likely secondary to the above -UA was positive for ketones and patient did appear dehydrated on admission -Patient was placed on IV fluids and appears to have improved  Sinus tachycardia/leukocytosis-SIRS -Suspect secondary to dehydration -Patient currently afebrile -Leukocytosis has resolved, suspect it was reactive to the above -As above chest x-ray and UA unremarkable for infection, CT abdomen pelvis is also unremarkable -Blood cultures show no growth to date -Covid  negative  Depression/anxiety -Continue BuSpar, hydroxyzine,  Remeron, Seroquel  GERD -Continue PPI  Obesity -BMI 34.3 -Patient will need to follow-up with her PCP to discuss lifestyle modifications including diet and exercise  DVT Prophylaxis Lovenox  Code Status: Full  Family Communication: None at bedside  Disposition Plan: Admitted.  Pending improvement in symptoms  Consultants None  Procedures  None  Antibiotics   Anti-infectives (From admission, onward)   None      Subjective:   Marga Melnick seen and examined today.  Patient continues to have some nausea but would like to eat.  Feels her abdominal pain is about the same.  Continues to feel some chest pain on the left side but with no other symptoms.  Denies shortness of breath, active vomiting, diarrhea or constipation, dizziness or headache.  Objective:   Vitals:   12/19/18 1900 12/19/18 2000 12/19/18 2026 12/20/18 0538  BP: 112/72 (!) 109/52 112/68 (!) 107/58  Pulse: (!) 104 (!) 106 (!) 113 (!) 102  Resp: (!) 21 19 16 16   Temp:   99 F (37.2 C) 98 F (36.7 C)  TempSrc:   Oral Oral  SpO2: 98% 100% 100% 100%  Weight:    89 kg  Height:        Intake/Output Summary (Last 24 hours) at 12/20/2018 1442 Last data filed at 12/20/2018 1000 Gross per 24 hour  Intake 931.39 ml  Output --  Net 931.39 ml   Filed Weights   12/19/18 0011 12/20/18 0538  Weight: 90.7 kg 89 kg    Exam  General: Well developed, well nourished, NAD, appears stated age  HEENT: NCAT, mucous membranes moist.   Cardiovascular: S1 S2 auscultated, RRR  Respiratory: Clear to auscultation bilaterally with equal chest rise  Abdomen: Soft, obese,  nontender, nondistended, + bowel sounds  Extremities: warm dry without cyanosis clubbing or edema  Neuro: AAOx3, nonfocal  Psych: Normal affect and demeanor    Data Reviewed: I have personally reviewed following labs and imaging studies  CBC: Recent Labs  Lab 12/19/18 0020  12/19/18 0921 12/20/18 0415  WBC 13.7*  --  10.7*  HGB 12.5 14.6 11.0*  HCT 38.2 43.0 34.4*  MCV 82.9  --  84.1  PLT 319  --  266   Basic Metabolic Panel: Recent Labs  Lab 12/19/18 0020 12/19/18 0921 12/19/18 1725 12/20/18 0415  NA 136 139  --  135  K 4.2 4.8  --  3.6  CL 99  --   --  103  CO2 23  --   --  22  GLUCOSE 440*  --   --  347*  BUN 13  --   --  9  CREATININE 0.81  --   --  0.61  CALCIUM 9.5  --   --  8.5*  MG  --   --  2.1  --   PHOS  --   --  3.6  --    GFR: Estimated Creatinine Clearance: 121.1 mL/min (by C-G formula based on SCr of 0.61 mg/dL). Liver Function Tests: Recent Labs  Lab 12/19/18 0020 12/20/18 0415  AST 11* 10*  ALT 16 11  ALKPHOS 86 65  BILITOT 0.4 0.6  PROT 6.9 5.5*  ALBUMIN 4.2 3.2*   Recent Labs  Lab 12/19/18 0020  LIPASE 31   No results for input(s): AMMONIA in the last 168 hours. Coagulation Profile: No results for input(s): INR, PROTIME in the last 168 hours. Cardiac Enzymes: No results for input(s): CKTOTAL, CKMB, CKMBINDEX, TROPONINI in the last 168 hours.  BNP (last 3 results) No results for input(s): PROBNP in the last 8760 hours. HbA1C: Recent Labs    12/19/18 0020  HGBA1C 11.3*   CBG: Recent Labs  Lab 12/19/18 2023 12/19/18 2232 12/19/18 2337 12/20/18 0405 12/20/18 0748  GLUCAP 153* 133* 147* 332* 227*   Lipid Profile: No results for input(s): CHOL, HDL, LDLCALC, TRIG, CHOLHDL, LDLDIRECT in the last 72 hours. Thyroid Function Tests: Recent Labs    12/19/18 0020  TSH 0.237*   Anemia Panel: No results for input(s): VITAMINB12, FOLATE, FERRITIN, TIBC, IRON, RETICCTPCT in the last 72 hours. Urine analysis:    Component Value Date/Time   COLORURINE STRAW (A) 12/19/2018 1244   APPEARANCEUR CLEAR 12/19/2018 1244   LABSPEC >1.046 (H) 12/19/2018 1244   PHURINE 5.0 12/19/2018 1244   GLUCOSEU >=500 (A) 12/19/2018 [place] 12/19/2018 [place] 12/19/2018 1244   BILIRUBINUR  negative 07/13/2018 1651   KETONESUR 80 (A) 12/19/2018 1244   PROTEINUR NEGATIVE 12/19/2018 1244   UROBILINOGEN 0.2 07/13/2018 1651   UROBILINOGEN 1.0 07/09/2017 1324   NITRITE NEGATIVE 12/19/2018 1244   LEUKOCYTESUR NEGATIVE 12/19/2018 1244   Sepsis Labs: @LABRCNTIP (procalcitonin:4,lacticidven:4)  ) Recent Results (from the past 240 hour(s))  SARS CORONAVIRUS 2 (TAT 6-24 HRS) Nasopharyngeal Nasopharyngeal Swab     Status: None   Collection Time: 12/19/18  1:39 PM   Specimen: Nasopharyngeal Swab  Result Value Ref Range Status   SARS Coronavirus 2 NEGATIVE NEGATIVE Final    Comment: (NOTE) SARS-CoV-2 target nucleic acids are NOT DETECTED. The SARS-CoV-2 RNA is generally detectable in upper and lower respiratory specimens during the acute phase of infection. Negative results do not preclude SARS-CoV-2 infection, do not rule out co-infections with other pathogens, and should not be used as the sole basis for treatment or other patient management decisions. Negative results must be combined with clinical observations, patient history, and epidemiological information. The expected result is Negative. Fact Sheet for Patients: SugarRoll.be Fact Sheet for Healthcare Providers: https://www.woods-mathews.com/ This test is not yet approved or cleared by the [place] FDA and  has been authorized for detection and/or diagnosis of SARS-CoV-2 by FDA under an Emergency Use Authorization (EUA). This EUA will remain  in effect (meaning this test can be used) for the duration of the COVID-19 declaration under Section 56 4(b)(1) of the Act, 21 U.S.C. section 360bbb-3(b)(1), unless the authorization is terminated or revoked sooner. Performed at [facility], [place] [address], [place], [place] [address]   Culture, blood (routine x 2)     Status: None (Preliminary result)   Collection Time: 12/19/18  8:42 PM   Specimen: BLOOD  Result Value Ref  Range Status   Specimen Description BLOOD RIGHT ARM  Final   Special Requests   Final    BOTTLES DRAWN AEROBIC ONLY Blood Culture results may not be optimal due to an inadequate volume of blood received in culture bottles   Culture   Final    NO GROWTH < 12 HOURS Performed at [facility], [place] [address] [place], [place] [address]    Report Status PENDING  Incomplete  Culture, blood (routine x 2)     Status: None (Preliminary result)   Collection Time: 12/19/18  8:42 PM   Specimen: BLOOD  Result Value Ref Range Status   Specimen Description BLOOD LEFT ARM  Final   Special Requests   Final    BOTTLES DRAWN AEROBIC AND ANAEROBIC Blood Culture adequate volume  Culture   Final    NO GROWTH < 12 HOURS Performed at [facility], [address] [place], [place] [address]    Report Status PENDING  Incomplete      Radiology Studies: DG Chest 2 View  Result Date: 12/19/2018 CLINICAL DATA:  Chest pain EXAM: CHEST - 2 VIEW COMPARISON:  11/07/2018 FINDINGS: Shallow lung inflation. Normal cardiomediastinal contours. No focal airspace consolidation or pulmonary edema. No pleural effusion or pneumothorax. IMPRESSION: No active cardiopulmonary disease. Electronically Signed   By: Deatra RobinsonKevin  Herman M.D.   On: 12/19/2018 00:42   CT Abdomen Pelvis W Contrast  Result Date: 12/19/2018 CLINICAL DATA:  Back pain.  Flank pain. EXAM: CT ABDOMEN AND PELVIS WITH CONTRAST TECHNIQUE: Multidetector CT imaging of the abdomen and pelvis was performed using the standard protocol following bolus administration of intravenous contrast. CONTRAST:  100mL OMNIPAQUE IOHEXOL 300 MG/ML  SOLN COMPARISON:  CT 10/08/2018 FINDINGS: Patient scanned in nonstandard position due to back pain Lower chest: Lung bases are clear. Hepatobiliary: No focal hepatic lesion. No biliary duct dilatation. Gallbladder is normal. Common bile duct is normal. Pancreas: Pancreas is normal. No ductal dilatation. No pancreatic inflammation.  Spleen: Normal spleen Adrenals/urinary tract: Adrenal glands and kidneys are normal. The ureters and bladder normal. Bladder mildly distended. Stomach/Bowel: Stomach, small bowel, appendix, and cecum are normal. The colon and rectosigmoid colon are normal. Vascular/Lymphatic: Abdominal aorta is normal caliber. No periportal or retroperitoneal adenopathy. No pelvic adenopathy. Reproductive: Uterus and adnexa normal. Other: No free fluid or free air. Musculoskeletal: No aggressive osseous lesion. IMPRESSION: 1. No explanation for back pain.  No obstructive uropathy. 2. Normal appendix. 3. Bladder is distended. Electronically Signed   By: Genevive BiStewart  Edmunds M.D.   On: 12/19/2018 11:35     Scheduled Meds: . atorvastatin  20 mg Oral q1800  . busPIRone  10 mg Oral Daily  . enoxaparin (LOVENOX) injection  40 mg Subcutaneous Q24H  . insulin aspart  0-15 Units Subcutaneous TID WC  . insulin aspart  0-5 Units Subcutaneous QHS  . insulin aspart  10 Units Subcutaneous TID WC  . insulin glargine  60 Units Subcutaneous QHS  . lamoTRIgine  50 mg Oral BID  . metoCLOPramide  10 mg Oral TID AC & HS  . mirtazapine  7.5 mg Oral QHS  . pantoprazole  40 mg Oral BID  . propranolol  20 mg Oral BID  . QUEtiapine  400 mg Oral QHS  . sodium chloride flush  3 mL Intravenous Once  . topiramate  25 mg Oral BID   Continuous Infusions: . sodium chloride Stopped (12/19/18 2015)  . dextrose 5 % and 0.45% NaCl 75 mL/hr at 12/19/18 1220  . insulin Stopped (12/19/18 2330)     LOS: 1 day   Time Spent in minutes   45 minutes  Anette Barra D.O. on 12/20/2018 at 2:42 PM  Between 7am to 7pm - Please see pager noted on [email or web address]  After 7pm go to [email or web address]  And look for the night coverage person covering for me after hours  [facility] Office  [phone]

## 2018-12-20 NOTE — Evaluation (Signed)
Clinical/Bedside Swallow Evaluation Patient Details  Name: Nancy [name] MRN: [id number] Date of Birth: 2000/03/17  Today's Date: 12/20/2018 Time: SLP Start Time (ACUTE ONLY): 1230 SLP Stop Time (ACUTE ONLY): 1245 SLP Time Calculation (min) (ACUTE ONLY): 15 min  Past Medical History:  Past Medical History:  Diagnosis Date  . Acanthosis nigricans   . Anxiety   . CHF (congestive heart failure) (HCC)   . Chronic lower back pain   . Depression   . DKA, type 1 (HCC) 09/13/2018  . Dyspepsia   . Obesity   . Ovarian cyst    pt is not aware of this hx (11/24/2017)  . Pre-diabetes   . 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 Premature baby   . Seizures (HCC)   . Type II diabetes mellitus (HCC)    insulin dependant   Past Surgical History:  Past Surgical History:  Procedure Laterality Date  . ABDOMINAL HERNIA REPAIR     "I was a baby"  . BIOPSY  10/12/2018   Procedure: BIOPSY;  Surgeon: Lynann Bologna, MD;  Location: [facility] ENDOSCOPY;  Service: Endoscopy;;  . ESOPHAGOGASTRODUODENOSCOPY (EGD) WITH PROPOFOL N/A 10/12/2018   Procedure: ESOPHAGOGASTRODUODENOSCOPY (EGD) WITH PROPOFOL;  Surgeon: Lynann Bologna, MD;  Location: [facility] ENDOSCOPY;  Service: Endoscopy;  Laterality: N/A;  . HERNIA REPAIR    . LEFT HEART CATH AND CORONARY ANGIOGRAPHY N/A 10/13/2018   Procedure: LEFT HEART CATH AND CORONARY ANGIOGRAPHY;  Surgeon: Kathleene Hazel, MD;  Location: [facility] INVASIVE CV LAB;  Service: Cardiovascular;  Laterality: N/A;  . TONSILLECTOMY AND ADENOIDECTOMY    . WISDOM TOOTH EXTRACTION  2017   HPI:  18 yo female admitted 12/19/2018 with N/V, abdominal pain, seizures, chest pain, syncope. PMH: DM1, HTN, HLD, GERD, obesity, depression, anxiety, (speudo)seizure disorder. CXR = no active disease. MRI = no acute abnormality   Assessment / Plan / Recommendation Clinical Impression  Pt presents with adequate oral motor strength and function. Pt reports she is missing a few teeth, but has no difficulty with  regular solids and thin liquids. Pt was observed during lunch (regular/thin). No obvious oral difficulty or overt s/s aspiration observed on any consistency. No further ST intervention recommended at this time. Please reconsult if needs arise.    SLP Visit Diagnosis: Dysphagia, unspecified (R13.10)    Aspiration Risk  Mild aspiration risk    Diet Recommendation Regular;Thin liquid   Liquid Administration via: Straw;Cup Medication Administration: Whole meds with liquid Supervision: Patient able to self feed Compensations: Slow rate;Small sips/bites Postural Changes: Seated upright at 90 degrees    Other  Recommendations Oral Care Recommendations: Oral care BID   Follow up Recommendations None          Prognosis Prognosis for Safe Diet Advancement: Good      Swallow Study   General Date of Onset: 12/19/18 HPI: 18 yo female admitted 12/19/2018 with N/V, abdominal pain, seizures, chest pain, syncope. PMH: DM1, HTN, HLD, GERD, obesity, depression, anxiety, (speudo)seizure disorder. CXR = no active disease. MRI = no acute abnormality Type of Study: Bedside Swallow Evaluation Previous Swallow Assessment: none Diet Prior to this Study: Regular;Thin liquids Temperature Spikes Noted: No Respiratory Status: Room air History of Recent Intubation: No Behavior/Cognition: Alert Oral Cavity Assessment: Within Functional Limits Oral Care Completed by SLP: No Oral Cavity - Dentition: Missing dentition Vision: Functional for self-feeding Self-Feeding Abilities: Able to feed self Patient Positioning: Partially reclined Baseline Vocal Quality: Normal Volitional Cough: Strong Volitional Swallow: Able to elicit    Oral/Motor/Sensory Function Overall  Oral Motor/Sensory Function: Within functional limits   Ice Chips Ice chips: Not tested   Thin Liquid Thin Liquid: Within functional limits Presentation: Straw    Nectar Thick Nectar Thick Liquid: Not tested   Honey Thick Honey Thick Liquid:  Not tested   Puree Puree: Within functional limits Presentation: Spoon;Self Fed   Solid     Solid: Within functional limits Presentation: [place], [place], [place] Pathologist Office: [phone] Pager: [phone]  Shonna Chock 12/20/2018,1:31 PM

## 2018-12-20 NOTE — Progress Notes (Addendum)
Inpatient Diabetes Program Recommendations  AACE/ADA: New Consensus Statement on Inpatient Glycemic Control (2015)  Target Ranges:  Prepandial:   less than 140 mg/dL      Peak postprandial:   less than 180 mg/dL (1-2 hours)      Critically ill patients:  140 - 180 mg/dL   Lab Results  Component Value Date   GLUCAP 227 (H) 12/20/2018   HGBA1C 11.3 (H) 12/19/2018    Review of Glycemic Control  Diabetes history: DM2 Outpatient Diabetes medications: Lantus 60 units + Humalog 15 units bid meal coverage Current orders for Inpatient glycemic control: Lantus 40 units + Novolog moderate correction tid + hs 0-5 units  Inpatient Diabetes Program Recommendations:    Noted patient off of IV insulin drip. -Increase Lantus to home dose of 60 units (give additional 20 units this am) -Novolog 10 units tid meal coverage if eats 50%  Secure chat to Dr. Ree Kida.  Diabetes coordinators have spoken to patient on prior admissions regarding elevated A1c and CBGs.  Thank you, Nani Gasser. Emeli Goguen, RN, MSN, CDE  Diabetes Coordinator Inpatient Glycemic Control Team Team Pager [phone] (8am-5pm) 12/20/2018 10:07 AM

## 2018-12-20 NOTE — Progress Notes (Signed)
Patient complaining of 9/10 left sided, stabbing chest pain. States this is "new pain" and has "never been this severe". Patient appears to be in no distress; comfortably resting in bed, watching TV. Paged MD with patient status. Orders to obtain EKG and troponin levels. Will continue to monitor.

## 2018-12-20 NOTE — Plan of Care (Signed)
  Problem: Clinical Measurements: Goal: Ability to maintain clinical measurements within normal limits will improve Outcome: Progressing Endo Tool transitioned off.

## 2018-12-21 ENCOUNTER — Encounter (HOSPITAL_COMMUNITY): Payer: Self-pay | Admitting: Internal Medicine

## 2018-12-21 DIAGNOSIS — R072 Precordial pain: Secondary | ICD-10-CM

## 2018-12-21 DIAGNOSIS — E1165 Type 2 diabetes mellitus with hyperglycemia: Secondary | ICD-10-CM

## 2018-12-21 DIAGNOSIS — I10 Essential (primary) hypertension: Secondary | ICD-10-CM

## 2018-12-21 LAB — BASIC METABOLIC PANEL
Anion gap: 8 (ref 5–15)
BUN: 17 mg/dL (ref 6–20)
CO2: 24 mmol/L (ref 22–32)
Calcium: 8.4 mg/dL — ABNORMAL LOW (ref 8.9–10.3)
Chloride: 105 mmol/L (ref 98–111)
Creatinine, Ser: 0.61 mg/dL (ref 0.44–1.00)
GFR calc Af Amer: >60 mL/min (ref >60)
GFR calc non Af Amer: >60 mL/min (ref >60)
Glucose, Bld: 269 mg/dL — ABNORMAL HIGH (ref 70–99)
Potassium: 3.9 mmol/L (ref 3.5–5.1)
Sodium: 137 mmol/L (ref 135–145)

## 2018-12-21 LAB — GLUCOSE, CAPILLARY
Glucose-Capillary: 132 mg/dL — ABNORMAL HIGH (ref 70–99)
Glucose-Capillary: 234 mg/dL — ABNORMAL HIGH (ref 70–99)
Glucose-Capillary: 279 mg/dL — ABNORMAL HIGH (ref 70–99)
Glucose-Capillary: 294 mg/dL — ABNORMAL HIGH (ref 70–99)
Glucose-Capillary: 343 mg/dL — ABNORMAL HIGH (ref 70–99)
Glucose-Capillary: 560 mg/dL (ref 70–99)

## 2018-12-21 LAB — C-REACTIVE PROTEIN: CRP: 1.3 mg/dL — ABNORMAL HIGH (ref <1.0)

## 2018-12-21 MED ORDER — INSULIN ASPART 100 UNIT/ML ~~LOC~~ SOLN
0.0000 [IU] | SUBCUTANEOUS | Status: DC
  Administered 2018-12-21: 2 [IU] via SUBCUTANEOUS

## 2018-12-21 MED ORDER — SODIUM CHLORIDE 0.9 % IV SOLN: INTRAVENOUS | Status: DC

## 2018-12-21 MED ORDER — ENSURE ENLIVE PO LIQD: 237.0000 mL | Freq: Two times a day (BID) | ORAL | Status: DC

## 2018-12-21 MED ORDER — COLCHICINE 0.6 MG PO TABS
0.6000 mg | ORAL_TABLET | Freq: Two times a day (BID) | ORAL | Status: DC
  Administered 2018-12-21 – 2018-12-22 (×2): 0.6 mg via ORAL
  Filled 2018-12-21 (×2): qty 1

## 2018-12-21 MED ORDER — INSULIN ASPART 100 UNIT/ML ~~LOC~~ SOLN: 0.0000 [IU] | Freq: Three times a day (TID) | SUBCUTANEOUS | Status: DC

## 2018-12-21 MED ORDER — INSULIN ASPART 100 UNIT/ML ~~LOC~~ SOLN
20.0000 [IU] | Freq: Once | SUBCUTANEOUS | Status: AC
  Administered 2018-12-21: 20 [IU] via SUBCUTANEOUS

## 2018-12-21 MED ORDER — INSULIN ASPART 100 UNIT/ML ~~LOC~~ SOLN
0.0000 [IU] | Freq: Three times a day (TID) | SUBCUTANEOUS | Status: DC
  Administered 2018-12-21: 8 [IU] via SUBCUTANEOUS
  Administered 2018-12-22: 15 [IU] via SUBCUTANEOUS
  Administered 2018-12-22 – 2018-12-23 (×2): 8 [IU] via SUBCUTANEOUS
  Administered 2018-12-23: 3 [IU] via SUBCUTANEOUS
  Administered 2018-12-23: 5 [IU] via SUBCUTANEOUS

## 2018-12-21 MED ORDER — INSULIN GLARGINE 100 UNIT/ML ~~LOC~~ SOLN
20.0000 [IU] | Freq: Once | SUBCUTANEOUS | Status: DC
  Filled 2018-12-21: qty 0.2

## 2018-12-21 NOTE — Progress Notes (Signed)
PROGRESS NOTE    Nancy [name]  DTO:[id number] DOB: 04-24-2000 DOA: 12/19/2018 PCP: [name], NP   Brief Narrative:  HPI on 12/19/2018 by Dr. Early Osmond Nancy [name] is a 18 y.o. female with medical history significant of uncontrolled type 1 diabetes mellitus, hypertension, hyperlipidemia, GERD, obesity, depression with anxiety, seizure/pseudoseizure disorder presents to emergency department with multiple medical issues.  She tells me that she has nausea, vomiting, abdominal pain, chest pain since 1 week.  Her abdominal pain is severe, generalized, nonradiating, no aggravating or relieving factors, associated with nausea and vomiting, could not keep anything down.  Reports severe chest pain midsternal, nonradiating, associated with shortness of breath.  Reports that she passed out 4 times this morning and had seizure which lasted for 10 minutes before arrival to the emergency department.  She tells me that she peed on herself when she had seizure.  She tells me that she has headache, blurry vision, lightheadedness, dizziness however denies fever, chills, cough, congestion, urinary symptoms such as dysuria, hematuria, foul-smelling urine, palpitation, leg swelling, orthopnea, PND, decreased appetite or sleep changes.  She tells me that although she continues to have nausea and vomiting and abdominal pain she is compliant with her insulin regime and her antiepileptic medication.  She forgot to take Lantus last night and this morning she took her 60 units of Lantus.  History of multiple admission last month with similar symptoms.  She tells me that she hads to find a new neurologist for her seizure disorder as her previous neurologist is retired.  Work-up on previous admission including MRI, EEG, carotid Doppler, transthoracic echo all were negative.  Interim history  admitted with hyperglycemia, nausea vomiting and generalized abdominal pain.  Patient continues to have periods  of hyperglycemia and continued nausea.  Currently n.p.o. on saline and antiemetics.  Patient also with chest pain, though troponins have been negative, cardiology consulted. Assessment & Plan   Uncontrolled diabetes mitis, type II with hyperglycemia/HHNK -Presented with a blood glucose of 394, however on BMP glucose was noted to be 440 -Anion gap was 14 -Patient was initially placed on insulin drip -Suspect secondary to noncompliance, patient does tell me that she has been taking her Lantus however holds her NovoLog when she is not eating -Last hemoglobin A1c was 11.3 on 12/19/2018 -Continue lantus -Blood glucose 560 this morning -patient also complaining of more abdominal pain -have changed her back to NPO, placed on IVF  -Change sliding scale to q4h   Nausea, vomiting, generalized abdominal pain -Unknown etiology, question gastroparesis- although on reglan -UA and chest x-ray unremarkable for infection -CT abdomen pelvisNegative for acute finding -Lipase and lactic acid within normal limits -Continue antiemetics and IV fluids as needed -Patient feels her nausea is getting better and would like to eat -Have placed on carb modified diet, will continue to monitor  Chest pain -Unknown etiology, may be secondary to the above -Troponin unremarkable x2.   -Patient did have cardiac catheterization on 10/13/2018 which is unremarkable -She continued to complain of chest pain on 12/20/2018, repeat high-sensitivity troponin was unremarkable. -EKG with subtle ST changes -Cardiology consulted and appreciated  Syncope/seizure/pseudoseizure -Patient had work-up on 12/01/2018 with carotid ultrasound which was unremarkable, echocardiogram showed a preserved EF with normal systolic and diastolic function.  MRI showed no acute findings.  EEG was also normal. -Continue Neurontin, Topamax, Lamictal -Continue seizure precautions -Patient is to follow-up with neurology as an  outpatient  Dehydration -Likely secondary to the above -UA was positive  for ketones and patient did appear dehydrated on admission -Patient was placed on IV fluids and appears to have improved  Sinus tachycardia/leukocytosis-SIRS -Suspect secondary to dehydration -Patient currently afebrile -Leukocytosis has resolved, suspect it was reactive to the above -As above chest x-ray and UA unremarkable for infection, CT abdomen pelvis is also unremarkable -Blood cultures show no growth to date -Covid negative  Depression/anxiety -Continue BuSpar, hydroxyzine, Remeron, Seroquel  GERD -Continue PPI  Obesity -BMI 34.3 -Patient will need to follow-up with her PCP to discuss lifestyle modifications including diet and exercise  DVT Prophylaxis Lovenox  Code Status: Full  Family Communication: None at bedside  Disposition Plan: Admitted.  Pending improvement in symptoms  Consultants Cardiology   Procedures  None  Antibiotics   Anti-infectives (From admission, onward)   None      Subjective:   Nancy [name] seen and examined today.  Continues to have some nausea she states without vomiting.  She has had some abdominal pain.  Has continued chest pain.  Denies shortness of breath, dizziness or headache. Objective:   Vitals:   12/20/18 1529 12/20/18 2104 12/21/18 0504 12/21/18 1004  BP: 108/60 115/67 (!) 100/59 109/70  Pulse: 93 98 79 96  Resp:      Temp: (!) 97.4 F (36.3 C) 99.1 F (37.3 C) 98 F (36.7 C)   TempSrc: Oral Oral Oral   SpO2: 91% 100% 100%   Weight:   91.3 kg   Height:        Intake/Output Summary (Last 24 hours) at 12/21/2018 1034 Last data filed at 12/20/2018 2035 Gross per 24 hour  Intake 880 ml  Output 700 ml  Net 180 ml   Filed Weights   12/19/18 0011 12/20/18 0538 12/21/18 0504  Weight: 90.7 kg 89 kg 91.3 kg   Exam  General: Well developed, well nourished, NAD, appears stated age  HEENT: NCAT, mucous membranes moist.    Cardiovascular: S1 S2 auscultated, RRR  Respiratory: Clear to auscultation bilaterally with equal chest rise  Abdomen: Soft, obese, nontender, nondistended, + bowel sounds  Extremities: warm dry without cyanosis clubbing or edema  Neuro: AAOx3, nonfocal  Psych: Appropriate mood and affect  Data Reviewed: I have personally reviewed following labs and imaging studies  CBC: Recent Labs  Lab 12/19/18 0020 12/19/18 0921 12/20/18 0415  WBC 13.7*  --  10.7*  HGB 12.5 14.6 11.0*  HCT 38.2 43.0 34.4*  MCV 82.9  --  84.1  PLT 319  --  266   Basic Metabolic Panel: Recent Labs  Lab 12/19/18 0020 12/19/18 0921 12/19/18 1725 12/20/18 0415 12/21/18 0425  NA 136 139  --  135 137  K 4.2 4.8  --  3.6 3.9  CL 99  --   --  103 105  CO2 23  --   --  22 24  GLUCOSE 440*  --   --  347* 269*  BUN 13  --   --  9 17  CREATININE 0.81  --   --  0.61 0.61  CALCIUM 9.5  --   --  8.5* 8.4*  MG  --   --  2.1  --   --   PHOS  --   --  3.6  --   --    GFR: Estimated Creatinine Clearance: 122.7 mL/min (by C-G formula based on SCr of 0.61 mg/dL). Liver Function Tests: Recent Labs  Lab 12/19/18 0020 12/20/18 0415  AST 11* 10*  ALT 16 11  ALKPHOS 86  65  BILITOT 0.4 0.6  PROT 6.9 5.5*  ALBUMIN 4.2 3.2*   Recent Labs  Lab 12/19/18 0020  LIPASE 31   No results for input(s): AMMONIA in the last 168 hours. Coagulation Profile: No results for input(s): INR, PROTIME in the last 168 hours. Cardiac Enzymes: No results for input(s): CKTOTAL, CKMB, CKMBINDEX, TROPONINI in the last 168 hours. BNP (last 3 results) No results for input(s): PROBNP in the last 8760 hours. HbA1C: Recent Labs    12/19/18 0020  HGBA1C 11.3*   CBG: Recent Labs  Lab 12/20/18 1135 12/20/18 1701 12/20/18 2103 12/21/18 0722 12/21/18 1000  GLUCAP 311* 333* 270* 560* 234*   Lipid Profile: No results for input(s): CHOL, HDL, LDLCALC, TRIG, CHOLHDL, LDLDIRECT in the last 72 hours. Thyroid Function  Tests: Recent Labs    12/19/18 0020  TSH 0.237*   Anemia Panel: No results for input(s): VITAMINB12, FOLATE, FERRITIN, TIBC, IRON, RETICCTPCT in the last 72 hours. Urine analysis:    Component Value Date/Time   COLORURINE STRAW (A) 12/19/2018 1244   APPEARANCEUR CLEAR 12/19/2018 1244   LABSPEC >1.046 (H) 12/19/2018 1244   PHURINE 5.0 12/19/2018 1244   GLUCOSEU >=500 (A) 12/19/2018 1244   HGBUR NEGATIVE 12/19/2018 1244   BILIRUBINUR NEGATIVE 12/19/2018 1244   BILIRUBINUR negative 07/13/2018 1651   KETONESUR 80 (A) 12/19/2018 1244   PROTEINUR NEGATIVE 12/19/2018 1244   UROBILINOGEN 0.2 07/13/2018 1651   UROBILINOGEN 1.0 07/09/2017 1324   NITRITE NEGATIVE 12/19/2018 1244   LEUKOCYTESUR NEGATIVE 12/19/2018 1244   Sepsis Labs: @LABRCNTIP (procalcitonin:4,lacticidven:4)  ) Recent Results (from the past 240 hour(s))  SARS CORONAVIRUS 2 (TAT 6-24 HRS) Nasopharyngeal Nasopharyngeal Swab     Status: None   Collection Time: 12/19/18  1:39 PM   Specimen: Nasopharyngeal Swab  Result Value Ref Range Status   SARS Coronavirus 2 NEGATIVE NEGATIVE Final    Comment: (NOTE) SARS-CoV-2 target nucleic acids are NOT DETECTED. The SARS-CoV-2 RNA is generally detectable in upper and lower respiratory specimens during the acute phase of infection. Negative results do not preclude SARS-CoV-2 infection, do not rule out co-infections with other pathogens, and should not be used as the sole basis for treatment or other patient management decisions. Negative results must be combined with clinical observations, patient history, and epidemiological information. The expected result is Negative. Fact Sheet for Patients: HairSlick.no Fact Sheet for Healthcare Providers: quierodirigir.com This test is not yet approved or cleared by the [place] FDA and  has been authorized for detection and/or diagnosis of SARS-CoV-2 by FDA under an Emergency  Use Authorization (EUA). This EUA will remain  in effect (meaning this test can be used) for the duration of the COVID-19 declaration under Section 56 4(b)(1) of the Act, 21 U.S.C. section 360bbb-3(b)(1), unless the authorization is terminated or revoked sooner. Performed at [facility], [address] [place], [place] [address]   Culture, blood (routine x 2)     Status: None (Preliminary result)   Collection Time: 12/19/18  8:42 PM   Specimen: BLOOD  Result Value Ref Range Status   Specimen Description BLOOD RIGHT ARM  Final   Special Requests   Final    BOTTLES DRAWN AEROBIC ONLY Blood Culture results may not be optimal due to an inadequate volume of blood received in culture bottles   Culture   Final    NO GROWTH < 12 HOURS Performed at [facility], [address] [place], [place] [address]    Report Status PENDING  Incomplete  Culture, blood (routine x 2)     Status: None (Preliminary result)   Collection Time: 12/19/18  8:42 PM   Specimen: BLOOD  Result Value Ref Range Status   Specimen Description BLOOD LEFT ARM  Final   Special Requests   Final    BOTTLES DRAWN AEROBIC AND ANAEROBIC Blood Culture adequate volume   Culture   Final    NO GROWTH < 12 HOURS Performed at [facility], [address] [place], [place] [address]    Report Status PENDING  Incomplete      Radiology Studies: CT Abdomen Pelvis W Contrast  Result Date: 12/19/2018 CLINICAL DATA:  Back pain.  Flank pain. EXAM: CT ABDOMEN AND PELVIS WITH CONTRAST TECHNIQUE: Multidetector CT imaging of the abdomen and pelvis was performed using the standard protocol following bolus administration of intravenous contrast. CONTRAST:  OMNIPAQUE IOHEXOL 300 MG/ML  SOLN COMPARISON:  CT 10/08/2018 FINDINGS: Patient scanned in nonstandard position due to back pain Lower chest: Lung bases are clear. Hepatobiliary: No focal hepatic lesion. No biliary duct dilatation. Gallbladder is normal. Common bile  duct is normal. Pancreas: Pancreas is normal. No ductal dilatation. No pancreatic inflammation. Spleen: Normal spleen Adrenals/urinary tract: Adrenal glands and kidneys are normal. The ureters and bladder normal. Bladder mildly distended. Stomach/Bowel: Stomach, small bowel, appendix, and cecum are normal. The colon and rectosigmoid colon are normal. Vascular/Lymphatic: Abdominal aorta is normal caliber. No periportal or retroperitoneal adenopathy. No pelvic adenopathy. Reproductive: Uterus and adnexa normal. Other: No free fluid or free air. Musculoskeletal: No aggressive osseous lesion. IMPRESSION: 1. No explanation for back pain.  No obstructive uropathy. 2. Normal appendix. 3. Bladder is distended. Electronically Signed   By: Genevive Bi M.D.   On: 12/19/2018 11:35     Scheduled Meds: . atorvastatin  20 mg Oral q1800  . busPIRone  10 mg Oral Daily  . enoxaparin (LOVENOX) injection  40 mg Subcutaneous Q24H  . feeding supplement (ENSURE ENLIVE)  237 mL Oral BID BM  . insulin aspart  0-15 Units Subcutaneous Q4H  . insulin glargine  60 Units Subcutaneous QHS  . lamoTRIgine  50 mg Oral BID  . metoCLOPramide  10 mg Oral TID AC & HS  . mirtazapine  7.5 mg Oral QHS  . pantoprazole  40 mg Oral BID  . propranolol  20 mg Oral BID  . QUEtiapine  400 mg Oral QHS  . sodium chloride flush  3 mL Intravenous Once  . topiramate  25 mg Oral BID   Continuous Infusions: . sodium chloride       LOS: 2 days   Time Spent in minutes   45 minutes  Keijuan Schellhase D.O. on 12/21/2018 at 10:34 AM  Between 7am to 7pm - Please see pager noted on [email or web address]  After 7pm go to [email or web address]  And look for the night coverage person covering for me after hours  [facility] Office  [phone]

## 2018-12-21 NOTE — Plan of Care (Signed)
  Problem: Clinical Measurements: Goal: Ability to maintain clinical measurements within normal limits will improve Outcome: Progressing CBG's remain elevated. Pt needs frequent reminders about oral intake and carbohydrate intake.

## 2018-12-21 NOTE — Consult Note (Addendum)
The patient has been seen in conjunction with Theodore Demark, PA-C. All aspects of care have been considered and discussed. The patient has been personally interviewed, examined, and all clinical data has been reviewed.   History, clinical exam, and electronic health records including EKGs, echo, and catheterization have been extensively reviewed.  Chest pain if anything has a pleuritic/pericarditic quality.  Exam is unremarkable.  EKG is nondiagnostic.  Looking at serial EKGs over time it appears that she has early repolarization that is more prominent at times than others.  Today I am impressed by minimal although perhaps chronic diffuse ST elevation.  The patient has never had an abnormal troponin over multiple determinations during the past 2 years.  Consider inflammatory chest pain related to pericarditis, especially given the relapsing nature.  Recommend CRP, ANA, sed rate, and sed rate although they have been normal in the past.  Start colchicine 0.6 mg twice daily  She does not need to have an ischemic evaluation.     Cardiology Consultation:   Patient ID: Nancy [name]; [id number]; 2000/04/11   Admit date: 12/19/2018 Date of Consult: 12/21/2018  Primary Care Provider: Claiborne Rigg, NP Primary Cardiologist: Dietrich Pates, MD 01/26/2018 Primary Electrophysiologist:  None   Patient Profile:   Nancy [name] is a 18 y.o. female with a hx of CP s/p cath 10/13/2018 w/ non-obs dz, DM, hx DKA, chronic abdominal pain, prior syncope, depression, anxiety, gastritis, and psychogenic nonepileptic Sz, who is being seen today for the evaluation of chest pain at the request of Dr Catha Gosselin.  History of Present Illness:   Ms. [place]fork was hospitalized 10/01-10/09/2018 with DKA and several other issues.  She had chest pain, ECG was concerning for STEMI>>she was taken to the cath lab w/ results below.  There was the possibility of a distal LAD dissection but not obvious.  Troponin  was negative.  10/10 ER visit for nausea/vomiting 10/13 ER visit for pain/shortness of breath 10/18 visit for nausea, vomiting, chest pain 10/24 ER visit for possible seizures 10/26 ER visit for abdominal pain 10/28 ER visit for suicidal ideation, she stayed in the ER until 11/1, admitted til 11/5 11/21 ER visit and admission for abdominal pain and nausea/vomiting, DC 11/24 11/24 ER visit and admission for syncope, possible seizure, DC 11/26 11/27 ER visit for nausea and vomiting 11/29 ER visit for nausea and vomiting 12/5 ER visit for seizures and hematemesis  12/13 ER visit and admission for nausea, diffuse abdominal pain, she was also complaining of chest pain and cardiology was asked to evaluate her.  She states the chest pain is the same as the pain she was having in October 2020 when she had her heart catheterization, but this is more severe.  The previous chest pain severity was a 6/10, this is a 9-01/08/08.  In October, the chest pain would get better with sublingual nitroglycerin, but she has not had any this admission. The chest pain is a little worse with deep inspiration.  She does not do very much, but will walk some with her dog.  She gets some shortness of breath with exertion, but has not had any chest pain episodes with this.  The dyspnea on exertion has not changed much recently.  This is the first time she has had chest pain this bad since October.  She has not had lower extremity edema, no orthopnea or PND.   Past Medical History:  Diagnosis Date  . Acanthosis nigricans   . Anxiety   .  CHF (congestive heart failure) (HCC)   . Chronic lower back pain   . Depression   . DKA, type 1 (HCC) 09/13/2018  . Dyspepsia   . Obesity   . Ovarian cyst    pt is not aware of this hx (11/24/2017)  . Pre-diabetes   . 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 Premature baby   . Seizures (HCC)   . Type II diabetes mellitus (HCC)    insulin dependant    Past Surgical History:    Procedure Laterality Date  . ABDOMINAL HERNIA REPAIR     "I was a baby"  . BIOPSY  10/12/2018   Procedure: BIOPSY;  Surgeon: Lynann Bologna, MD;  Location: [facility] ENDOSCOPY;  Service: Endoscopy;;  . ESOPHAGOGASTRODUODENOSCOPY (EGD) WITH PROPOFOL N/A 10/12/2018   Procedure: ESOPHAGOGASTRODUODENOSCOPY (EGD) WITH PROPOFOL;  Surgeon: Lynann Bologna, MD;  Location: [facility] ENDOSCOPY;  Service: Endoscopy;  Laterality: N/A;  . HERNIA REPAIR    . LEFT HEART CATH AND CORONARY ANGIOGRAPHY N/A 10/13/2018   Procedure: LEFT HEART CATH AND CORONARY ANGIOGRAPHY;  Surgeon: Kathleene Hazel, MD;  Location: [facility] INVASIVE CV LAB;  Service: Cardiovascular;  Laterality: N/A;  . TONSILLECTOMY AND ADENOIDECTOMY    . WISDOM TOOTH EXTRACTION  2017     Prior to Admission medications   Medication Sig Start Date End Date Taking? Authorizing Provider  atorvastatin (LIPITOR) 20 MG tablet Take 1 tablet (20 mg total) by mouth daily at 6 PM. Patient taking differently: Take 10 mg by mouth 2 (two) times daily.  10/05/18  Yes Storm Frisk, MD  busPIRone (BUSPAR) 10 MG tablet Take 10 mg by mouth daily.  11/19/18  Yes [provider]  cycloSPORINE (RESTASIS) 0.05 % ophthalmic emulsion Place 1 drop into both eyes 2 (two) times daily.   Yes [provider]  dicyclomine (BENTYL) 10 MG capsule Take 10 mg by mouth 3 (three) times daily as needed (for abdominal pain).   Yes [provider]  hydrOXYzine (ATARAX/VISTARIL) 50 MG tablet Take 1 tablet (50 mg total) by mouth 3 (three) times daily as needed for anxiety. Patient taking differently: Take 50 mg by mouth 3 (three) times daily.  11/11/18  Yes Armandina Stammer I, NP  insulin glargine (LANTUS) 100 UNIT/ML injection Inject 0.6 mLs (60 Units total) into the skin at bedtime. For diabetes management 11/11/18  Yes Armandina Stammer I, NP  insulin lispro (HUMALOG) 100 UNIT/ML injection Inject 0.15 mLs (15 Units total) into the skin 2 (two) times daily with a meal. Patient  taking differently: Inject 15 Units into the skin See admin instructions. Inject 15 units subcutaneously up to twice daily with meals 10/05/18  Yes Storm Frisk, MD  lamoTRIgine (LAMICTAL) 25 MG tablet Take 2 tablets (50 mg total) by mouth 2 (two) times daily. For mood stabilization 11/11/18  Yes Nwoko, Nicole Kindred I, NP  lubiprostone (AMITIZA) 24 MCG capsule Take 1 capsule (24 mcg total) by mouth 2 (two) times daily with a meal. Patient taking differently: Take 24 mcg by mouth daily with breakfast.  10/27/18  Yes Esterwood, Amy S, PA-C  meloxicam (MOBIC) 7.5 MG tablet Take 7.5 mg by mouth daily with lunch.    Yes [provider]  metoCLOPramide (REGLAN) 10 MG tablet Take 1 tablet (10 mg total) by mouth 4 (four) times daily -  before meals and at bedtime. Patient taking differently: Take 10 mg by mouth daily before breakfast.  11/30/18 12/30/18 Yes Jerald Kief, MD  mirtazapine (REMERON) 7.5 MG tablet Take  1 tablet (7.5 mg total) by mouth at bedtime. For depression/sleep 11/11/18  Yes Armandina Stammer I, NP  Olopatadine HCl (PAZEO) 0.7 % SOLN Place 2 drops into both eyes 2 (two) times daily.    Yes [provider]  prochlorperazine (COMPAZINE) 25 MG suppository Place 1 suppository (25 mg total) rectally every 12 (twelve) hours as needed for nausea or vomiting. 12/05/18  Yes Melene Plan, DO  promethazine (PHENERGAN) 25 MG suppository Place 25 mg rectally every 8 (eight) hours as needed for nausea or vomiting.   Yes [provider]  propranolol (INDERAL) 20 MG tablet Take 1 tablet (20 mg total) by mouth 3 (three) times daily. For anxiety/HTN Patient taking differently: Take 20 mg by mouth 2 (two) times daily. For anxiety/HTN 11/11/18  Yes Armandina Stammer I, NP  QUEtiapine (SEROQUEL) 200 MG tablet Take 400 mg by mouth at bedtime.    Yes [provider]  topiramate (TOPAMAX) 25 MG tablet Take 1 tablet (25 mg total) by mouth 2 (two) times daily. For seizure activities 11/11/18  Yes  Armandina Stammer I, NP  Multiple Vitamin (MULTIVITAMIN WITH MINERALS) TABS tablet Take 1 tablet by mouth daily. Patient not taking: Reported on 12/19/2018 09/18/18   Clapacs, Jackquline Denmark, MD  pantoprazole (PROTONIX) 40 MG tablet Take 1 tablet (40 mg total) by mouth 2 (two) times daily. For acid reflux Patient not taking: Reported on 12/19/2018 11/11/18   Armandina Stammer I, NP    Inpatient Medications: Scheduled Meds: . atorvastatin  20 mg Oral q1800  . busPIRone  10 mg Oral Daily  . enoxaparin (LOVENOX) injection  40 mg Subcutaneous Q24H  . feeding supplement (ENSURE ENLIVE)  237 mL Oral BID BM  . insulin aspart  0-15 Units Subcutaneous Q4H  . insulin glargine  60 Units Subcutaneous QHS  . lamoTRIgine  50 mg Oral BID  . metoCLOPramide  10 mg Oral TID AC & HS  . mirtazapine  7.5 mg Oral QHS  . pantoprazole  40 mg Oral BID  . propranolol  20 mg Oral BID  . QUEtiapine  400 mg Oral QHS  . sodium chloride flush  3 mL Intravenous Once  . topiramate  25 mg Oral BID   Continuous Infusions: . sodium chloride     PRN Meds: acetaminophen **OR** acetaminophen, dextrose, HYDROmorphone (DILAUDID) injection, hydrOXYzine, ondansetron **OR** ondansetron (ZOFRAN) IV, prochlorperazine  Allergies:    Allergies  Allergen Reactions  . Ibuprofen Other (See Comments)    GI MD said to not take this anymore  . Oatmeal Rash    Social History:   Social History   Socioeconomic History  . Marital status: Single    Spouse name: Not on file  . Number of children: 0  . Years of education: Not on file  . Highest education level: Not on file  Occupational History  . Occupation: Admission  Tobacco Use  . Smoking status: Never Smoker  . Smokeless tobacco: Never Used  Substance and Sexual Activity  . Alcohol use: No    Alcohol/week: 0.0 standard drinks  . Drug use: No  . Sexual activity: Never  Other Topics Concern  . Not on file  Social History Narrative   Lives with mom and mom's girlfriend.   No EtOH,  tobacco, Drugs   Social Determinants of Health   Financial Resource Strain:   . Difficulty of Paying Living Expenses: Not on file  Food Insecurity:   . Worried About Programme researcher, broadcasting/film/video in the Last Year: Not  on file  . Ran Out of Food in the Last Year: Not on file  Transportation Needs:   . Lack of Transportation (Medical): Not on file  . Lack of Transportation (Non-Medical): Not on file  Physical Activity:   . Days of Exercise per Week: Not on file  . Minutes of Exercise per Session: Not on file  Stress:   . Feeling of Stress : Not on file  Social Connections:   . Frequency of Communication with Friends and Family: Not on file  . Frequency of Social Gatherings with Friends and Family: Not on file  . Attends Religious Services: Not on file  . Active Member of Clubs or Organizations: Not on file  . Attends BankerClub or Organization Meetings: Not on file  . Marital Status: Not on file  Intimate Partner Violence:   . Fear of Current or Ex-Partner: Not on file  . Emotionally Abused: Not on file  . Physically Abused: Not on file  . Sexually Abused: Not on file    Family History:   Family History  Problem Relation Age of Onset  . Diabetes Mother   . Hypertension Mother   . Obesity Mother   . Asthma Mother   . Allergic rhinitis Mother   . Eczema Mother   . Cervical cancer Mother   . Diabetes Father   . Hypertension Father   . Obesity Father   . Hyperlipidemia Father   . Hypertension Paternal Aunt   . Hypertension Maternal Grandfather   . Colon cancer Maternal Grandfather   . Diabetes Paternal Grandmother   . Obesity Paternal Grandmother   . Diabetes Paternal Grandfather   . Obesity Paternal Grandfather   . Angioedema Neg Hx   . Immunodeficiency Neg Hx   . Urticaria Neg Hx   . Stomach cancer Neg Hx   . Esophageal cancer Neg Hx    Family Status:  Family Status  Relation Name Status  . Mother  Alive  . Father  Deceased  . Emelda BrothersPat Aunt  (Not Specified)  . MGF  Alive  . PGM   Alive  . PGF  Alive  . MGM  Alive  . Neg Hx  (Not Specified)    ROS:  Please see the history of present illness.  All other ROS reviewed and negative.     Physical Exam/Data:   Vitals:   12/20/18 1529 12/20/18 2104 12/21/18 0504 12/21/18 1004  BP: 108/60 115/67 (!) 100/59 109/70  Pulse: 93 98 79 96  Resp:      Temp: (!) 97.4 F (36.3 C) 99.1 F (37.3 C) 98 F (36.7 C)   TempSrc: Oral Oral Oral   SpO2: 91% 100% 100%   Weight:   91.3 kg   Height:        Intake/Output Summary (Last 24 hours) at 12/21/2018 1124 Last data filed at 12/20/2018 2035 Gross per 24 hour  Intake 880 ml  Output 700 ml  Net 180 ml   Filed Weights   12/19/18 0011 12/20/18 0538 12/21/18 0504  Weight: 90.7 kg 89 kg 91.3 kg   Body mass index is 34.54 kg/m.  General:  Well nourished, well developed, female in no acute distress HEENT: normal Lymph: no adenopathy Neck: JVD -not elevated Endocrine:  No thryomegaly Vascular: No carotid bruits; 4/4 extremity pulses 2+  Cardiac:  normal S1, S2; RRR; no murmur Lungs:  clear bilaterally, no wheezing, rhonchi or rales  Abd: soft, nontender, no hepatomegaly  Ext: no edema Musculoskeletal:  No  deformities, BUE and BLE strength normal and equal Skin: warm and dry  Neuro:  CNs 2-12 intact, no focal abnormalities noted Psych:  Normal affect   EKG:  The EKG was personally reviewed and demonstrates:  SR, HR 91, ?early repol but no acute ischemic changes Telemetry:  Telemetry was personally reviewed and demonstrates:  SR, ST   CV studies:   ECHO: 12/01/2018  1. Left ventricular ejection fraction, by visual estimation, is 65 to 70%. The left ventricle has hyperdynamic function. There is mildly increased left ventricular hypertrophy.  2. Left ventricular diastolic parameters are indeterminate, howevere medial annular tissue Doppler suggests normal diastolic function.  3. Global right ventricle has normal systolic function.The right ventricular size is  normal. No increase in right ventricular wall thickness.  4. Left atrial size was normal.  5. Right atrial size was normal.  6. The mitral valve is normal in structure. Trace mitral valve regurgitation. No evidence of mitral stenosis.  7. The tricuspid valve is normal in structure. Tricuspid valve regurgitation is trivial.  8. The aortic valve is normal in structure. Aortic valve regurgitation is trivial. No evidence of aortic valve sclerosis or stenosis.  9. The pulmonic valve was normal in structure. Pulmonic valve regurgitation is not visualized. 10. The inferior vena cava is normal in size with greater than 50% respiratory variability, suggesting right atrial pressure of 3 mmHg  CATH: 10/13/2018 1. The LAD is a large caliber vessel that courses to the apex and becomes small caliber near the apex. 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 Good flow to the apex. No obvious dissection flap or contrast dye staining.  2. The Circumflex is large and has no obstructive disease. 3. The RCA is large and has no obstructive disease.   Recommendations: Cannot exclude spontaneous dissection of the distal LAD but there is no obvious dissection flap or dye staining. The very distal LAD becomes small in caliber. This could be a normal variant.  Will follow on telemetry for 48 hours  CAROTID DOPPLERS: 12/01/2018  Summary: Right Carotid: There is no evidence of stenosis in the right ICA. There was no                evidence of thrombus, dissection, atherosclerotic plaque or                stenosis in the cervical carotid system.  Left Carotid: There is no evidence of stenosis in the left ICA. There was no               evidence of thrombus, dissection, atherosclerotic plaque or               stenosis in the cervical carotid system   Laboratory Data:   Chemistry Recent Labs  Lab 12/19/18 0020 12/19/18 0921 12/20/18 0415 12/21/18 0425  NA 136 139 135 137  K 4.2 4.8 3.6  3.9  CL 99  --  103 105  CO2 23  --  22 24  GLUCOSE 440*  --  347* 269*  BUN 13  --  9 17  CREATININE 0.81  --  0.61 0.61  CALCIUM 9.5  --  8.5* 8.4*  GFRNONAA >60  --  >60 >60  GFRAA >60  --  >60 >60  ANIONGAP 14  --  10 8    Lab Results  Component Value Date   ALT 11 12/20/2018   AST 10 (L) 12/20/2018   ALKPHOS 65 12/20/2018   BILITOT  0.6 12/20/2018   Hematology Recent Labs  Lab 12/19/18 0020 12/19/18 0921 12/20/18 0415  WBC 13.7*  --  10.7*  RBC 4.61  --  4.09  HGB 12.5 14.6 11.0*  HCT 38.2 43.0 34.4*  MCV 82.9  --  84.1  MCH 27.1  --  26.9  MCHC 32.7  --  32.0  RDW 13.1  --  13.3  PLT 319  --  266   Cardiac Enzymes High Sensitivity Troponin:   Recent Labs  Lab 12/01/18 0242 12/03/18 1627 12/19/18 0020 12/19/18 0917 12/20/18 1606  TROPONINIHS <2 <2 <2 <2 3      BNPNo results for input(s): BNP, PROBNP in the last 168 hours.  DDimer  Recent Labs  Lab 12/19/18 1026  DDIMER <0.27   TSH:  Lab Results  Component Value Date   TSH 0.237 (L) 12/19/2018   Lipids: Lab Results  Component Value Date   CHOL 95 11/07/2018   HDL 52 11/07/2018   LDLCALC 26 11/07/2018   TRIG 86 11/07/2018   CHOLHDL 1.8 11/07/2018   HgbA1c: Lab Results  Component Value Date   HGBA1C 11.3 (H) 12/19/2018   Magnesium:  Magnesium  Date Value Ref Range Status  12/19/2018 2.1 1.7 - 2.4 mg/dL Final    Comment:    Performed at [facility], [place] [address] [place], [place] [address]     Radiology/Studies:  DG Chest 2 View  Result Date: 12/19/2018 CLINICAL DATA:  Chest pain EXAM: CHEST - 2 VIEW COMPARISON:  11/07/2018 FINDINGS: Shallow lung inflation. Normal cardiomediastinal contours. No focal airspace consolidation or pulmonary edema. No pleural effusion or pneumothorax. IMPRESSION: No active cardiopulmonary disease. Electronically Signed   By: Ulyses Jarred M.D.   On: 12/19/2018 00:42   CT Abdomen Pelvis W Contrast  Result Date: 12/19/2018 CLINICAL DATA:  Back  pain.  Flank pain. EXAM: CT ABDOMEN AND PELVIS WITH CONTRAST TECHNIQUE: Multidetector CT imaging of the abdomen and pelvis was performed using the standard protocol following bolus administration of intravenous contrast. CONTRAST:  165mL OMNIPAQUE IOHEXOL 300 MG/ML  SOLN COMPARISON:  CT 10/08/2018 FINDINGS: Patient scanned in nonstandard position due to back pain Lower chest: Lung bases are clear. Hepatobiliary: No focal hepatic lesion. No biliary duct dilatation. Gallbladder is normal. Common bile duct is normal. Pancreas: Pancreas is normal. No ductal dilatation. No pancreatic inflammation. Spleen: Normal spleen Adrenals/urinary tract: Adrenal glands and kidneys are normal. The ureters and bladder normal. Bladder mildly distended. Stomach/Bowel: Stomach, small bowel, appendix, and cecum are normal. The colon and rectosigmoid colon are normal. Vascular/Lymphatic: Abdominal aorta is normal caliber. No periportal or retroperitoneal adenopathy. No pelvic adenopathy. Reproductive: Uterus and adnexa normal. Other: No free fluid or free air. Musculoskeletal: No aggressive osseous lesion. IMPRESSION: 1. No explanation for back pain.  No obstructive uropathy. 2. Normal appendix. 3. Bladder is distended. Electronically Signed   By: Suzy Bouchard M.D.   On: 12/19/2018 11:35    Assessment and Plan:   1. Chest pain - pt describes the pain as 9-1/2 out of 10, but ECG is not acute and cardiac enzymes are negative. - there was concern for distal LAD dissection at cath 10/2018, but do not feel that is the case now, as there would be empiric evidence of this on ECG and ez. - do not need to repeat cath, EF prev normal on echo w/ no WMA - will leave treatment of non-cardiac chest pain to IM  Otherwise, per IM Active Problems:   Obesity  Hypertension   Nausea and vomiting   Generalized abdominal pain   Chest pain   Tachycardia   Syncope   Depression   Seizures (HCC)   Uncontrolled diabetes mellitus with  hyperglycemia (HCC)   Leukocytosis   Dehydration     For questions or updates, please contact [facility] Please consult [email or web address] for contact info under Cardiology/STEMI.   SignedTheodore Demark, Rhonda Barrett, PA-C  12/21/2018 11:24 AM

## 2018-12-21 NOTE — Progress Notes (Signed)
Nancy [name] with Service Response called for clarification on patient tray orders.  She states patient is calling down and ordering multiple trays and stating she is the nurse Janett Billow) approving the orders.  I have not approved any orders for trays with service response.   Nancy instructed to take orders through RN instead of patient.

## 2018-12-21 NOTE — TOC Initial Note (Signed)
Transition of Care [facility]) - Initial/Assessment Note    Patient Details  Name: Nancy [name] MRN: [id number] Date of Birth: 07-16-2000  Transition of Care [facility]) CM/SW Contact:    Gala Lewandowsky, RN Phone Number: 12/21/2018, 1:55 PM  Clinical Narrative:  Patient presented for uncontrolled diabetes. Prior to arrival from home with her mother. Her mother drives her to all appointments and picks up medications [facility]  Patient has glucose meter. Patient states she now has Medicaid and she goes to [facility]. Appointment will be scheduled for hospital follow up. Patient states she gets medications at no cost. No further needs from Case Manager at this time.                  Expected Discharge Plan: Home/Self Care Barriers to Discharge: Continued Medical Work up(monitoring gluocse- cardiology followng as well.)   Patient Goals and CMS Choice Patient states their goals for this hospitalization and ongoing recovery are:: "to return home"   Choice offered to / list presented to : NA  Expected Discharge Plan and Services Expected Discharge Plan: Home/Self Care In-house Referral: NA Discharge Planning Services: CM Consult Post Acute Care Choice: NA Living arrangements for the past 2 months: Apartment                  Prior Living Arrangements/Services Living arrangements for the past 2 months: Apartment Lives with:: Parents Patient language and need for interpreter reviewed:: Yes Do you feel safe going back to the place where you live?: Yes      Need for Family Participation in Patient Care: Yes (Comment) Care giver support system in place?: Yes (comment)   Criminal Activity/Legal Involvement Pertinent to Current Situation/Hospitalization: No - Comment as needed  Activities of Daily Living Home Assistive Devices/Equipment: CBG Meter ADL Screening (condition at time of admission) Patient's cognitive ability adequate to safely complete daily  activities?: Yes Is the patient deaf or have difficulty hearing?: No Does the patient have difficulty seeing, even when wearing glasses/contacts?: No Does the patient have difficulty concentrating, remembering, or making decisions?: No Patient able to express need for assistance with ADLs?: Yes Does the patient have difficulty dressing or bathing?: No Independently performs ADLs?: Yes (appropriate for developmental age) Does the patient have difficulty walking or climbing stairs?: Yes Weakness of Legs: Both Weakness of Arms/Hands: None  Permission Sought/Granted Permission sought to share information with : Family Supports                Emotional Assessment Appearance:: Appears stated age Attitude/Demeanor/Rapport: Engaged Affect (typically observed): Appropriate Orientation: : Oriented to Self, Oriented to Place, Oriented to  Time, Oriented to Situation Alcohol / Substance Use: Not Applicable Psych Involvement: No (comment)  Admission diagnosis:  Gastroparesis [K31.84] Dehydration [E86.0] Tachycardia [R00.0] Uncontrolled diabetes mellitus (HCC) [E11.65] Patient Active Problem List   Diagnosis Date Noted  . Leukocytosis 12/19/2018  . Dehydration   . Pseudoseizures   . Uncontrolled diabetes mellitus (HCC) 11/28/2018  . Uncontrolled diabetes mellitus with hyperglycemia (HCC) 11/27/2018  . 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  . MDD (major depressive disorder), recurrent episode, severe (HCC) 11/06/2018  . Nonspecific abnormal electrocardiogram (ECG) (EKG)   . Chest pain of uncertain etiology   . Hypertensive urgency 10/08/2018  . Conversion disorder with attacks or seizures, acute episode, with psychological stressor 09/16/2018  . MDD (major depressive disorder), recurrent severe, without psychosis (HCC)   . AKI (acute kidney injury) (HCC) 08/04/2018  .  Seizures ([place]) 08/03/2018  . Depression 07/25/2018  . Syncope 01/30/2018  .  Orthostatic hypotension 01/24/2018  . Tachycardia 12/28/2017  . Chronic abdominal pain 12/24/2017  . Chest pain 12/19/2017  . Nausea and vomiting 08/21/2017  . Generalized abdominal pain 08/21/2017  . Non compliance with medical treatment 01/27/2012  . Adjustment disorder 09/16/2011  . Acanthosis nigricans   . Goiter   . Obesity 06/14/2010  . Hypertension 06/14/2010   PCP:  [name], NP Pharmacy[facility]   [id number] [place], [place] [address] [place] [address] Phone: [phone] Fax: [place], [place] [address] [place] [address] Phone: [phone] Fax: [phone]     Social Determinants of Health (SDOH) Interventions    Readmission Risk Interventions Readmission Risk Prevention Plan 12/21/2018 12/01/2018 08/05/2018  Transportation Screening Complete Complete Complete  PCP or Specialist Appt within 3-5 Days - - Complete  Social Work Consult for [place] Planning/Counseling - - Not Complete  SW consult not completed comments - - no needs  Palliative Care Screening - - Not Applicable  Medication Review Press photographer) Complete Referral to Pharmacy Referral to Pharmacy  PCP or Specialist appointment within 3-5 days of discharge Complete Complete -  [place] or Home Care Consult Complete Complete -  SW Recovery Care/Counseling Consult Complete Complete -  Palliative Care Screening Not Applicable Not Applicable -  [place] Not Applicable Not Applicable -  Some recent data might be hidden

## 2018-12-22 DIAGNOSIS — K3184 Gastroparesis: Secondary | ICD-10-CM

## 2018-12-22 DIAGNOSIS — K5901 Slow transit constipation: Secondary | ICD-10-CM

## 2018-12-22 DIAGNOSIS — E1143 Type 2 diabetes mellitus with diabetic autonomic (poly)neuropathy: Secondary | ICD-10-CM

## 2018-12-22 LAB — BASIC METABOLIC PANEL
Anion gap: 8 (ref 5–15)
BUN: 16 mg/dL (ref 6–20)
CO2: 26 mmol/L (ref 22–32)
Calcium: 9.3 mg/dL (ref 8.9–10.3)
Chloride: 105 mmol/L (ref 98–111)
Creatinine, Ser: 0.72 mg/dL (ref 0.44–1.00)
GFR calc Af Amer: >60 mL/min (ref >60)
GFR calc non Af Amer: >60 mL/min (ref >60)
Glucose, Bld: 229 mg/dL — ABNORMAL HIGH (ref 70–99)
Potassium: 4.1 mmol/L (ref 3.5–5.1)
Sodium: 139 mmol/L (ref 135–145)

## 2018-12-22 LAB — LEVETIRACETAM LEVEL: Levetiracetam Lvl: <1 ug/mL — ABNORMAL LOW (ref 10.0–40.0)

## 2018-12-22 LAB — EXTRACTABLE NUCLEAR ANTIGEN ANTIBODY
ENA SM Ab Ser-aCnc: <0.2 AI (ref 0.0–0.9)
Ribonucleic Protein: <0.2 AI (ref 0.0–0.9)
SSA (Ro) (ENA) Antibody, IgG: <0.2 AI (ref 0.0–0.9)
SSB (La) (ENA) Antibody, IgG: <0.2 AI (ref 0.0–0.9)
Scleroderma (Scl-70) (ENA) Antibody, IgG: <0.2 AI (ref 0.0–0.9)
ds DNA Ab: <1 IU/mL (ref 0–9)

## 2018-12-22 LAB — CBC
HCT: 36.9 % (ref 36.0–46.0)
Hemoglobin: 12 g/dL (ref 12.0–15.0)
MCH: 27.2 pg (ref 26.0–34.0)
MCHC: 32.5 g/dL (ref 30.0–36.0)
MCV: 83.7 fL (ref 80.0–100.0)
Platelets: 283 10*3/uL (ref 150–400)
RBC: 4.41 MIL/uL (ref 3.87–5.11)
RDW: 13.1 % (ref 11.5–15.5)
WBC: 9.5 10*3/uL (ref 4.0–10.5)
nRBC: 0 % (ref 0.0–0.2)

## 2018-12-22 LAB — GLUCOSE, CAPILLARY
Glucose-Capillary: 196 mg/dL — ABNORMAL HIGH (ref 70–99)
Glucose-Capillary: 119 mg/dL — ABNORMAL HIGH (ref 70–99)
Glucose-Capillary: 362 mg/dL — ABNORMAL HIGH (ref 70–99)
Glucose-Capillary: 268 mg/dL — ABNORMAL HIGH (ref 70–99)
Glucose-Capillary: 217 mg/dL — ABNORMAL HIGH (ref 70–99)

## 2018-12-22 LAB — SEDIMENTATION RATE: Sed Rate: 4 mm/hr (ref 0–22)

## 2018-12-22 LAB — ANTI-JO 1 ANTIBODY, IGG: Anti JO-1: <0.2 AI (ref 0.0–0.9)

## 2018-12-22 MED ORDER — POLYETHYLENE GLYCOL 3350 17 G PO PACK
17.0000 g | PACK | Freq: Every day | ORAL | Status: DC
  Administered 2018-12-22 – 2018-12-23 (×2): 17 g via ORAL
  Filled 2018-12-22 (×2): qty 1

## 2018-12-22 MED ORDER — BISACODYL 10 MG RE SUPP
10.0000 mg | Freq: Once | RECTAL | Status: AC
  Administered 2018-12-22: 10 mg via RECTAL
  Filled 2018-12-22: qty 1

## 2018-12-22 MED ORDER — LACTULOSE 10 GM/15ML PO SOLN
20.0000 g | Freq: Three times a day (TID) | ORAL | Status: DC
  Administered 2018-12-22 – 2018-12-23 (×4): 20 g via ORAL
  Filled 2018-12-22 (×4): qty 30

## 2018-12-22 MED ORDER — DOCUSATE SODIUM 100 MG PO CAPS
200.0000 mg | ORAL_CAPSULE | Freq: Once | ORAL | Status: AC
  Administered 2018-12-22: 200 mg via ORAL
  Filled 2018-12-22: qty 2

## 2018-12-22 NOTE — Progress Notes (Addendum)
The patient has been seen in conjunction with Laverda Page, NP. All aspects of care have been considered and discussed. The patient has been personally interviewed, examined, and all clinical data has been reviewed.   Still with atypical chest pain.  Has not responded to anti-inflammatory therapy.  Etiology of pain is uncertain.  There is no other reasonable cardiac work-up at this time.  No further testing seems indicated.   Progress Note  Patient Name: Nancy [name] Date of Encounter: 12/22/2018  Primary Cardiologist: Dietrich Pates, MD   Subjective   Still having intermittent episodes of chest pain this morning. No associated with anything per her report.   Inpatient Medications    Scheduled Meds: . atorvastatin  20 mg Oral q1800  . busPIRone  10 mg Oral Daily  . enoxaparin (LOVENOX) injection  40 mg Subcutaneous Q24H  . insulin aspart  0-15 Units Subcutaneous TID WC  . insulin glargine  60 Units Subcutaneous QHS  . lamoTRIgine  50 mg Oral BID  . metoCLOPramide  10 mg Oral TID AC & HS  . mirtazapine  7.5 mg Oral QHS  . pantoprazole  40 mg Oral BID  . polyethylene glycol  17 g Oral Daily  . propranolol  20 mg Oral BID  . QUEtiapine  400 mg Oral QHS  . sodium chloride flush  3 mL Intravenous Once  . topiramate  25 mg Oral BID   Continuous Infusions:  PRN Meds: acetaminophen **OR** acetaminophen, dextrose, HYDROmorphone (DILAUDID) injection, hydrOXYzine, ondansetron **OR** ondansetron (ZOFRAN) IV, prochlorperazine   Vital Signs    Vitals:   12/21/18 2114 12/22/18 0413 12/22/18 0500 12/22/18 0815  BP: 118/69 (!) 106/59  (!) 97/53  Pulse: (!) 106 80  98  Resp:  18  18  Temp: 98.9 F (37.2 C) 98.2 F (36.8 C)  98.3 F (36.8 C)  TempSrc: Oral Oral  Oral  SpO2: 100% 100%  100%  Weight:   90.7 kg   Height:        Intake/Output Summary (Last 24 hours) at 12/22/2018 1322 Last data filed at 12/22/2018 0810 Gross per 24 hour  Intake 960 ml  Output --  Net  960 ml   Last 3 Weights 12/22/2018 12/21/2018 12/20/2018  Weight (lbs) 199 lb 14.4 oz 201 lb 3.2 oz 196 lb 3.2 oz  Weight (kg) 90.674 kg 91.264 kg 88.996 kg  Some encounter information is confidential and restricted. Go to Review Flowsheets activity to see all data.      Telemetry    SR-->ST - Personally Reviewed  ECG    SR with diffuse ST elevation - Personally Reviewed  Physical Exam  Young AAF GEN: No acute distress.   Neck: No JVD Cardiac: RRR, no murmurs, rubs, or gallops.  Respiratory: Clear to auscultation bilaterally. GI: Soft, nontender, non-distended  MS: No edema; No deformity. Neuro:  Nonfocal  Psych: Normal affect   Labs    High Sensitivity Troponin:   Recent Labs  Lab 12/01/18 0242 12/03/18 1627 12/19/18 0020 12/19/18 0917 12/20/18 1606  TROPONINIHS <2 <2 <2 <2 3      Chemistry Recent Labs  Lab 12/19/18 0020 12/20/18 0415 12/21/18 0425 12/22/18 0355  NA 136 135 137 139  K 4.2 3.6 3.9 4.1  CL 99 103 105 105  CO2 23 22 24 26   GLUCOSE 440* 347* 269* 229*  BUN 13 9 17 16   CREATININE 0.81 0.61 0.61 0.72  CALCIUM 9.5 8.5* 8.4* 9.3  PROT 6.9 5.5*  --   --  ALBUMIN 4.2 3.2*  --   --   AST 11* 10*  --   --   ALT 16 11  --   --   ALKPHOS 86 65  --   --   BILITOT 0.4 0.6  --   --   GFRNONAA >60 >60 >60 >60  GFRAA >60 >60 >60 >60  ANIONGAP 14 10 8 8      Hematology Recent Labs  Lab 12/19/18 0020 12/19/18 0921 12/20/18 0415 12/22/18 0355  WBC 13.7*  --  10.7* 9.5  RBC 4.61  --  4.09 4.41  HGB 12.5 14.6 11.0* 12.0  HCT 38.2 43.0 34.4* 36.9  MCV 82.9  --  84.1 83.7  MCH 27.1  --  26.9 27.2  MCHC 32.7  --  32.0 32.5  RDW 13.1  --  13.3 13.1  PLT 319  --  266 283    BNPNo results for input(s): BNP, PROBNP in the last 168 hours.   DDimer  Recent Labs  Lab 12/19/18 1026  DDIMER <0.27     Radiology    No results found.  Cardiac Studies   ECHO: 12/01/2018 1. Left ventricular ejection fraction, by visual estimation, is 65 to  70%. The left ventricle has hyperdynamic function. There is mildly increased left ventricular hypertrophy. 2. Left ventricular diastolic parameters are indeterminate, howevere medial annular tissue Doppler suggests normal diastolic function. 3. Global right ventricle has normal systolic function.The right ventricular size is normal. No increase in right ventricular wall thickness. 4. Left atrial size was normal. 5. Right atrial size was normal. 6. The mitral valve is normal in structure. Trace mitral valve regurgitation. No evidence of mitral stenosis. 7. The tricuspid valve is normal in structure. Tricuspid valve regurgitation is trivial. 8. The aortic valve is normal in structure. Aortic valve regurgitation is trivial. No evidence of aortic valve sclerosis or stenosis. 9. The pulmonic valve was normal in structure. Pulmonic valve regurgitation is not visualized. 10. The inferior vena cava is normal in size with greater than 50% respiratory variability, suggesting right atrial pressure of 3 mmHg  CATH: 10/13/2018 1. The LAD is a large caliber vessel that courses to the apex and becomes small caliber near the apex. 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 Good flow to the apex. No obvious dissection flap or contrast dye staining.  2. The Circumflex is large and has no obstructive disease. 3. The RCA is large and has no obstructive disease.   Recommendations: Cannot exclude spontaneous dissection of the distal LAD but there is no obvious dissection flap or dye staining. The very distal LAD becomes small in caliber. This could be a normal variant. Will follow on telemetry for 48 hours  CAROTID DOPPLERS: 12/01/2018  Summary: Right Carotid: There is no evidence of stenosis in the right ICA. There was no evidence of thrombus, dissection, atherosclerotic plaque or stenosis in the cervical carotid system.  Left Carotid: There is no  evidence of stenosis in the left ICA. There was no evidence of thrombus, dissection, atherosclerotic plaque or stenosis in the cervical carotid system  Patient Profile     18 y.o. female with a hx of CP s/p cath 10/13/2018 w/ non-obs dz,DM, hx DKA, chronic abdominal pain, prior syncope, depression, anxiety, gastritis, and psychogenic nonepileptic Sz, who was seen for the evaluation of chest pain at the request of Dr Ree Kida.   Assessment & Plan    1. Chest pain: reports continued intermittent episodes of chest pain this  morning. Not associated with anything. Was started on colchicine yesterday, but inflammatory markers are essentially normal. She reports no relief with addition of colchicine. Will stop for now. Suspect noncardiac source of chest pain. Of note, has not had a BM in several days. RN to treat.   2. Hx of gastritis: On PPI, does not associate symptoms with eating. Notes indicate she has been ordering multiple trays.   For questions or updates, please contact [facility] Please consult [email or web address] for contact info under        Signed, Lesleigh Noe, MD  12/22/2018, 1:22 PM

## 2018-12-22 NOTE — Progress Notes (Signed)
PROGRESS NOTE  Nancy [name] LFY:[id number] DOB: 05-24-2000 DOA: 12/19/2018 PCP: Claiborne Rigg, NP  Brief History   HPI on 12/19/2018 by Dr. Yolanda Bonine [name] a 18 y.o.femalewith medical history significant ofuncontrolled type 1 diabetes mellitus, hypertension, hyperlipidemia, GERD, obesity, depression with anxiety, seizure/pseudoseizure disorder presents to emergency department with multiple medical issues.  She tells me that she has nausea, vomiting, abdominal pain, chest pain since 1 week.Her abdominal pain is severe, generalized, nonradiating, no aggravating or relieving factors, associated with nausea and vomiting, could not keep anything down. Reports severe chest pain midsternal, nonradiating, associated with shortness of breath.Reports that she passed out 4 times this morning and had seizure which lasted for 10 minutes before arrival to the emergency department. She tells me that she peed on herself when she had seizure.  She tells me that she has headache, blurry vision, lightheadedness, dizziness however denies fever, chills, cough, congestion, urinary symptoms such as dysuria, hematuria, foul-smelling urine, palpitation, leg swelling, orthopnea, PND, decreased appetite or sleep changes.  She tells me that although she continues to have nausea and vomiting and abdominal pain she is compliant with her insulin regime and her antiepileptic medication. She forgot to take Lantus last night and this morning she took her 60 units of Lantus.  History of multiple admission last month with similar symptoms. She tells me that she hadsto find a new neurologist for her seizure disorder as her previous neurologist is retired.Work-up on previous admission including MRI, EEG, carotid Doppler, transthoracic echo all were negative.  Interim history The patient was admitted with hyperglycemia, nausea vomiting and generalized abdominal pain.  Patient continued to have  periods of hyperglycemia and continued nausea.  Currently n.p.o. on saline and antiemetics for the first 3 days of her admission. Patient also complains of chest pain, though troponins have been negative. Cardiology has been consulted. Today I have learned that the patient has not had a BM in 6 days. She is now eating well. No nausea or vomiting.  Consultants  . Cardiology  Procedures  . None  Antibiotics   Anti-infectives (From admission, onward)   None    .  Subjective  The patient is resting comfortably. No new complaints.  Objective   Vitals:  Vitals:   12/22/18 0413 12/22/18 0815  BP: (!) 106/59 (!) 97/53  Pulse: 80 98  Resp: 18 18  Temp: 98.2 F (36.8 C) 98.3 F (36.8 C)  SpO2: 100% 100%    Exam:  Constitutional:  . The patient is awake, alert, and oriented x 3. No acute distress. Respiratory:  . No increased work of breathing. . No wheezes, rales, or rhonchi . No tactile fremitus Cardiovascular:  . Regular rate and rhythm . No murmurs, ectopy, or gallups. . No lateral PMI. No thrills. Abdomen:  . Abdomen is soft, non-tender, non-distended . No hernias, masses, or organomegaly . Normoactive bowel sounds.  Musculoskeletal:  . No cyanosis, clubbing, or edema Skin:  . No rashes, lesions, ulcers . palpation of skin: no induration or nodules Neurologic:  . CN 2-12 intact . Sensation all 4 extremities intact Psychiatric:  . Mental status o Mood, affect appropriate o Orientation to person, place, time  . judgment and insight appear intact   I have personally reviewed the following:   Today's Data  . Vitals . BMP . CBC  Scheduled Meds: . atorvastatin  20 mg Oral q1800  . busPIRone  10 mg Oral Daily  . enoxaparin (LOVENOX) injection  40 mg  Subcutaneous Q24H  . insulin aspart  0-15 Units Subcutaneous TID WC  . insulin glargine  60 Units Subcutaneous QHS  . lamoTRIgine  50 mg Oral BID  . metoCLOPramide  10 mg Oral TID AC & HS  . mirtazapine  7.5  mg Oral QHS  . pantoprazole  40 mg Oral BID  . polyethylene glycol  17 g Oral Daily  . propranolol  20 mg Oral BID  . QUEtiapine  400 mg Oral QHS  . sodium chloride flush  3 mL Intravenous Once  . topiramate  25 mg Oral BID   Continuous Infusions:  Active Problems:   Obesity   Hypertension   Nausea and vomiting   Generalized abdominal pain   Chest pain   Tachycardia   Syncope   Depression   Seizures (HCC)   Uncontrolled diabetes mellitus with hyperglycemia (HCC)   Leukocytosis   Dehydration   LOS: 3 days   A & P   Uncontrolled diabetes, type II with hyperglycemia/HHNK: The patient presented with a blood glucose of 394, however on BMP glucose was noted to be 440 with anion gap of 14. She responded to an insulin drip. The patient is notably non-compliant with her insulin at home. She states that she takes her lantus, but holds her novolog when she is not eating.  HbA1c from 12/19/2018 was 11.3. The patient was made NPO yesterday. However she has had her diet changed to heart healthy/carbohydrate controlled. She has been ordering multiple trays. No abdominal distress upon my visit. Glucose 119 this am. She is currently receiving Lantus 60 units daily and resistant SSI scale insulin coverage qac and hs.   Nausea, vomiting, generalized abdominal pain: Likely due to gastroparesis and chronic, resistant constipation. Continue reglan. Soap suds enemas q shift until she has two large BM's.. Pt may be discharged to home once her constipation is resolved. Will start on lactulose at home. She has been ordering multiple trays in the last 24 hours.  Chest pain: Possibly due to pericarditis. I appreciate cardiology's assistance. No further cardiac work up is recommended. Dr. Katrinka Blazing states that he would like to continue to monitor her for another 24 hours. She is receiving Colchicine.Patient did have cardiac catheterization on 10/13/2018 which is unremarkable. Her EKG has subtle ST changes, but  troponin unremarkable x2. I appreciate cardiology's assistance.   Syncope/seizure/pseudoseizure: Patient had work-up on 12/01/2018 with carotid ultrasound which was unremarkable, echocardiogram showed a preserved EF with normal systolic and diastolic function.  MRI showed no acute findings.  EEG was also normal. Continue Neurontin, Topamax, Lamictal. Continue seizure precautions. Patient is to follow-up with neurology as an outpatient.  Dehydration: Likely secondary to chronic hyperglycemia. UA was positive for ketones and patient did appear dehydrated on admission. Monitor volume/electrolyte status. -Patient was placed on IV fluids and appears to have improved  Sinus tachycardia/leukocytosis-SIRS: Suspect secondary to dehydration. Leukocytosis has resolved. No current source of infection found. Blood cultures have had no growth to day. She has tested negative for COVID.  Depression/anxiety: Continue BuSpar, hydroxyzine, Remeron, Seroquel.  GERD: Continue PPI.  Obesity: BMI 34.3. Complicates all cares. Patient will need to follow-up with her PCP to discuss lifestyle modifications including diet and exercise.  I have seen and examined this patient myself. I have spent 35 minutes in her evaluation and care.  DVT Prophylaxis: Lovenox Code Status: Full Family Communication: None at bedside Disposition Plan: Admitted.  Pending improvement in symptoms  Henrique Parekh, DO [facility] Direct contact: see [email or web address]  7PM-7AM contact night coverage as above 12/22/2018, 3:21 PM  LOS: 3 days

## 2018-12-22 NOTE — Progress Notes (Signed)
Patient states she has not had BM since 12/10 and feels constipated.  Abdomen distended, soft with bowel sounds x 4 quadrants, tender to palpation, passing flatus.  Given Miralax and dulcolax suppository at 0925 hrs with no results at 1400 hrs.  Patient requesting enema.  Dr. Benny Lennert notified and soap suds enema ordered.  Patient given Soap suds enema at 1500hrs with no results.  Patient states enema gave her abdominal cramping and is refusing further enemas at this time.  Dr.  Benny Lennert notified that patient had no BM and refusing further enemas at this time.

## 2018-12-22 NOTE — Progress Notes (Signed)
Inpatient Diabetes Program Recommendations  AACE/ADA: New Consensus Statement on Inpatient Glycemic Control (2015)  Target Ranges:  Prepandial:   less than 140 mg/dL      Peak postprandial:   less than 180 mg/dL (1-2 hours)      Critically ill patients:  140 - 180 mg/dL   Lab Results  Component Value Date   GLUCAP 362 (H) 12/22/2018   HGBA1C 11.3 (H) 12/19/2018    Review of Glycemic Control Results for Nancy [name], Nancy [name] (MRN [id number]) as of 12/22/2018 13:45  Ref. Range 12/21/2018 21:16 12/21/2018 23:54 12/22/2018 04:10 12/22/2018 07:40 12/22/2018 11:10  Glucose-Capillary Latest Ref Range: 70 - 99 mg/dL 343 (H) 279 (H) 196 (H) 119 (H) 362 (H)   Inpatient Diabetes Program Recommendations:   -Novolog 10 units tid meal coverage if eats 50% Secure chat sent to Dr. Benny Lennert  Thank you, Nani Gasser. Lyle Leisner, RN, MSN, CDE  Diabetes Coordinator Inpatient Glycemic Control Team Team Pager [phone] (8am-5pm) 12/22/2018 1:45 PM

## 2018-12-23 DIAGNOSIS — R0789 Other chest pain: Secondary | ICD-10-CM

## 2018-12-23 LAB — CBC WITH DIFFERENTIAL/PLATELET
Abs Immature Granulocytes: 0.12 10*3/uL — ABNORMAL HIGH (ref 0.00–0.07)
Basophils Absolute: 0.1 10*3/uL (ref 0.0–0.1)
Basophils Relative: 1 %
Eosinophils Absolute: 0.1 10*3/uL (ref 0.0–0.5)
Eosinophils Relative: 1 %
HCT: 38 % (ref 36.0–46.0)
Hemoglobin: 12.4 g/dL (ref 12.0–15.0)
Immature Granulocytes: 1 %
Lymphocytes Relative: 38 %
Lymphs Abs: 3.7 10*3/uL (ref 0.7–4.0)
MCH: 27 pg (ref 26.0–34.0)
MCHC: 32.6 g/dL (ref 30.0–36.0)
MCV: 82.8 fL (ref 80.0–100.0)
Monocytes Absolute: 1.1 10*3/uL — ABNORMAL HIGH (ref 0.1–1.0)
Monocytes Relative: 11 %
Neutro Abs: 4.7 10*3/uL (ref 1.7–7.7)
Neutrophils Relative %: 48 %
Platelets: 285 10*3/uL (ref 150–400)
RBC: 4.59 MIL/uL (ref 3.87–5.11)
RDW: 13.2 % (ref 11.5–15.5)
WBC: 9.7 10*3/uL (ref 4.0–10.5)
nRBC: 0 % (ref 0.0–0.2)

## 2018-12-23 LAB — BASIC METABOLIC PANEL
Anion gap: 11 (ref 5–15)
BUN: 12 mg/dL (ref 6–20)
CO2: 24 mmol/L (ref 22–32)
Calcium: 9.2 mg/dL (ref 8.9–10.3)
Chloride: 105 mmol/L (ref 98–111)
Creatinine, Ser: 0.58 mg/dL (ref 0.44–1.00)
GFR calc Af Amer: >60 mL/min (ref >60)
GFR calc non Af Amer: >60 mL/min (ref >60)
Glucose, Bld: 153 mg/dL — ABNORMAL HIGH (ref 70–99)
Potassium: 3.6 mmol/L (ref 3.5–5.1)
Sodium: 140 mmol/L (ref 135–145)

## 2018-12-23 LAB — GLUCOSE, CAPILLARY
Glucose-Capillary: 186 mg/dL — ABNORMAL HIGH (ref 70–99)
Glucose-Capillary: 307 mg/dL — ABNORMAL HIGH (ref 70–99)
Glucose-Capillary: 288 mg/dL — ABNORMAL HIGH (ref 70–99)
Glucose-Capillary: 210 mg/dL — ABNORMAL HIGH (ref 70–99)

## 2018-12-23 IMAGING — DX DG CHEST 1V PORT
1 series · 1 of 1 positions shown · non-contrast
Comparison: Chest radiograph December 16, 2017

CLINICAL DATA: Shortness of breath for 3 weeks.  History of CHF.

EXAM:
PORTABLE CHEST 1 VIEW

[chest ap]
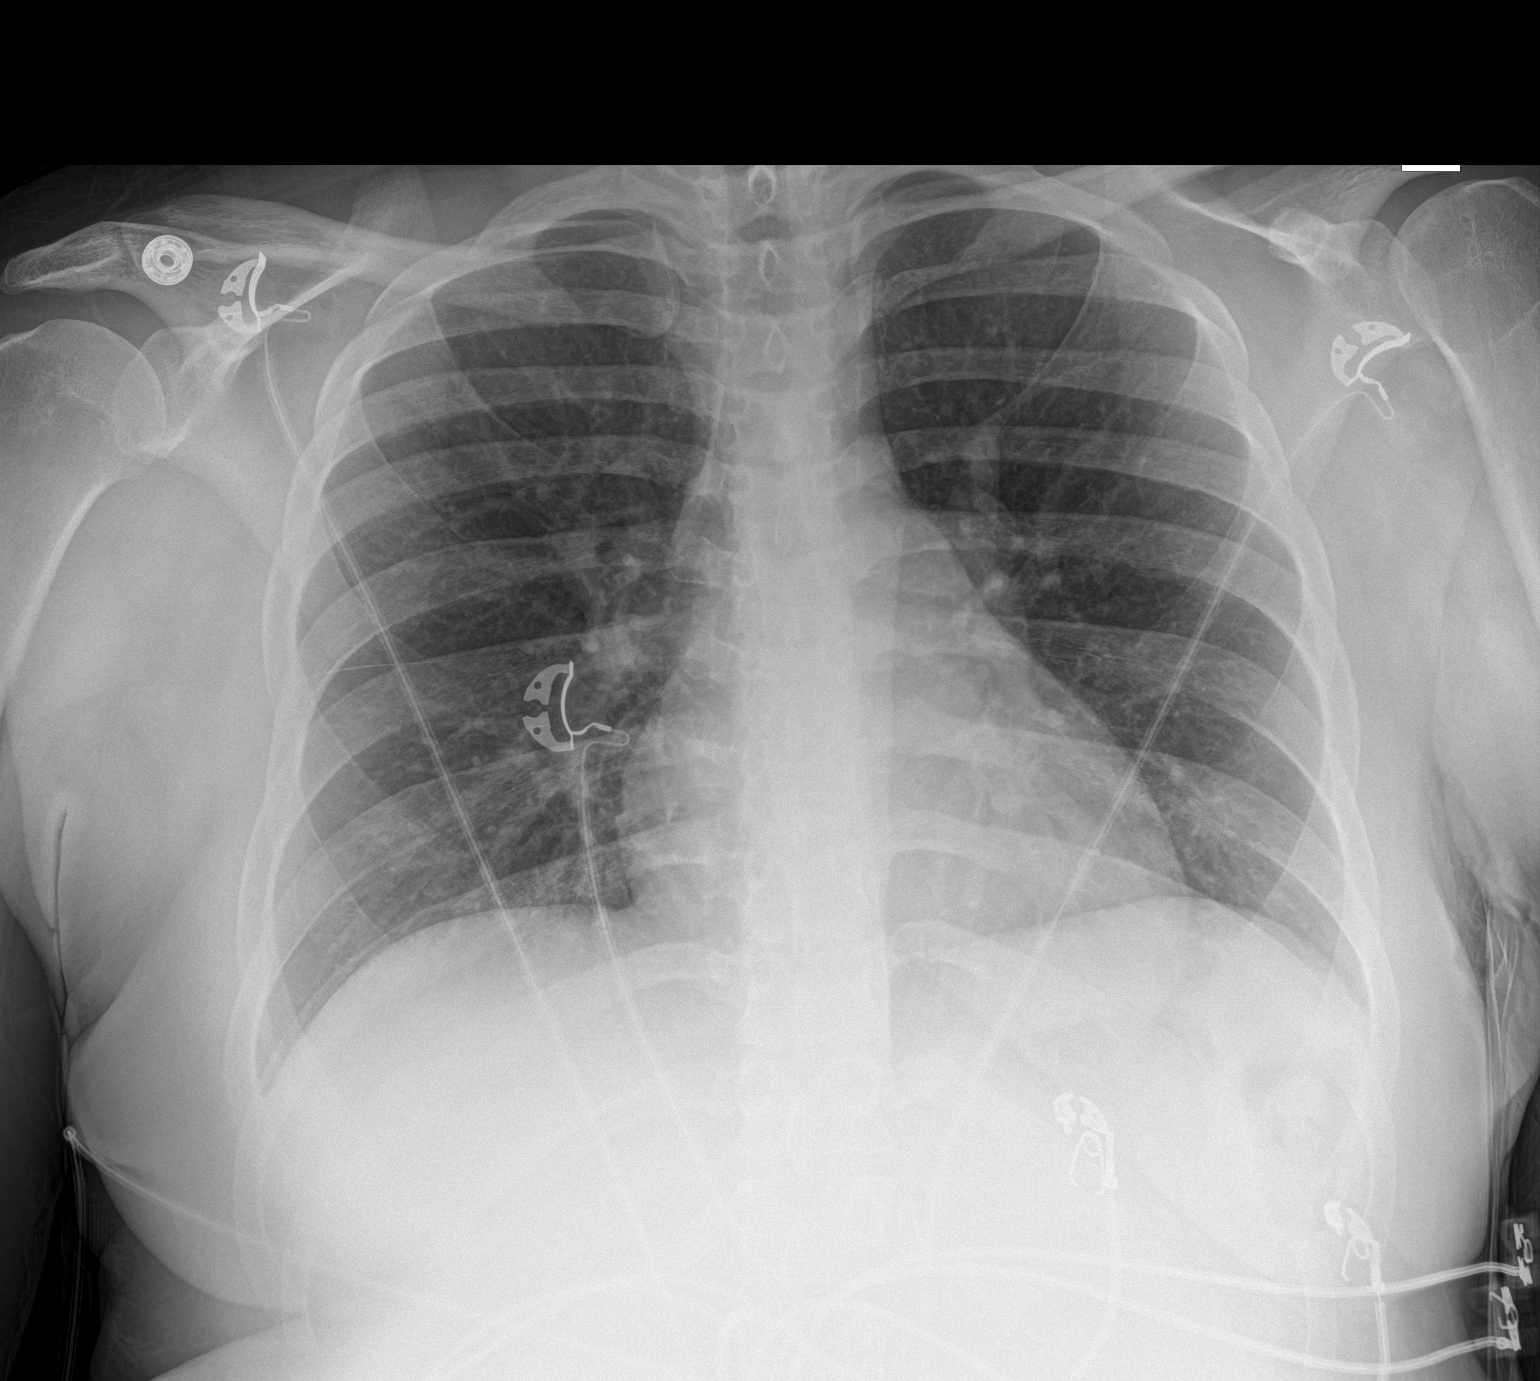

[1 of 1 positions shown; findings below may reference images not displayed]

FINDINGS: Cardiomediastinal silhouette is normal. No pleural effusions or
focal consolidations. Trachea projects midline and there is no
pneumothorax. Soft tissue planes and included osseous structures are
non-suspicious.
IMPRESSION: Normal chest radiograph.

## 2018-12-23 MED ORDER — BISACODYL 10 MG RE SUPP
10.0000 mg | Freq: Every day | RECTAL | Status: DC
  Administered 2018-12-23: 10 mg via RECTAL
  Filled 2018-12-23: qty 1

## 2018-12-23 MED ORDER — LACTULOSE 10 GM/15ML PO SOLN: 20.0000 g | Freq: Three times a day (TID) | ORAL | 0 refills | Status: DC

## 2018-12-23 MED ORDER — POLYETHYLENE GLYCOL 3350 17 G PO PACK: 17.0000 g | PACK | Freq: Every day | ORAL | 0 refills | Status: DC

## 2018-12-23 MED ORDER — DIPHENHYDRAMINE HCL 25 MG PO CAPS: 25.0000 mg | ORAL_CAPSULE | Freq: Three times a day (TID) | ORAL | Status: DC | PRN

## 2018-12-23 MED ORDER — DIPHENHYDRAMINE HCL 50 MG/ML IJ SOLN
25.0000 mg | Freq: Three times a day (TID) | INTRAMUSCULAR | Status: DC | PRN
  Administered 2018-12-23: 25 mg via INTRAVENOUS
  Filled 2018-12-23: qty 1

## 2018-12-23 MED ORDER — PROPRANOLOL HCL 20 MG PO TABS: 20.0000 mg | ORAL_TABLET | Freq: Two times a day (BID) | ORAL | 0 refills | Status: DC

## 2018-12-23 MED ORDER — BISACODYL 10 MG RE SUPP: 10.0000 mg | Freq: Every day | RECTAL | 0 refills | Status: DC

## 2018-12-23 NOTE — Progress Notes (Signed)
PHARMACIST - PHYSICIAN COMMUNICATION   CONCERNING: IV to Oral Route Change Policy  RECOMMENDATION: This patient is receiving diphenhydramine by the intravenous route.  Based on criteria approved by the Pharmacy and Therapeutics Committee, the intravenous medication(s) is/are being converted to the equivalent oral dose form(s).   DESCRIPTION: These criteria include:  The patient is eating (either orally or via tube) and/or has been taking other orally administered medications for a least 24 hours  The patient has no evidence of active gastrointestinal bleeding or impaired GI absorption (gastrectomy, short bowel, patient on TNA or NPO).  If you have questions about this conversion, please contact the Pharmacy Department  []   [phone] )  [facility] []   [phone] )  [facility] [x]   [phone] )  [facility] []   [phone] )  [facility] []   [phone] )  [place], PharmD Clinical Pharmacist **Pharmacist phone directory can now be found on [email or web address] (PW [email or web address]).  Listed under [place].

## 2018-12-23 NOTE — Progress Notes (Signed)
Patient's mother was called and made aware of discharge and education. Pt's mother's states she will be here shortly to pick up pt.

## 2018-12-23 NOTE — Discharge Summary (Signed)
Physician Discharge Summary  ZYAIR RHEIN GNF:[id number] DOB: [birth year] DOA: 12/19/2018  PCP: Claiborne Rigg, NP  Admit date: 12/19/2018 Discharge date: 12/23/2018  Recommendations for Outpatient Follow-up:  Take Amitiza twice a day as prescribed. Hold Lactulose for more than 2 BM's a day. Follow up with PCP in 7-10 days. Follow up with Dr. Adela Lank in 2 weeks.  Follow up with neurology in a month.  Follow-up Information    [facility] Follow up.   Contact information: [address] [place] [address] [phone]         Discharge Diagnoses: Principal diagnosis is #1 1. Abdominal pain, nausea and vomiting 2. Diabetic gastroparesis 3. Chronic constipation 4. Uncontrolled DM II 5. Medical non-compliance. 6. Chronic non-cardiac chest pain. 7. Syncope, seizure/pseudoseizure  Discharge Condition: Fair.  Disposition: Home  Diet recommendation: Carbohydrate controlled.  Filed Weights   12/21/18 0504 12/22/18 0500 12/23/18 0500  Weight: 91.3 kg 90.7 kg 89.9 kg    History of present illness:   CINDIA HUSTEAD is a 18 y.o. female with medical history significant of uncontrolled type 1 diabetes mellitus, hypertension, hyperlipidemia, GERD, obesity, depression with anxiety, seizure/pseudoseizure disorder presents to emergency department with multiple medical issues.  She tells me that she has nausea, vomiting, abdominal pain, chest pain since 1 week.  Her abdominal pain is severe, generalized, nonradiating, no aggravating or relieving factors, associated with nausea and vomiting, could not keep anything down.  Reports severe chest pain midsternal, nonradiating, associated with shortness of breath.  Reports that she passed out 4 times this morning and had seizure which lasted for 10 minutes before arrival to the emergency department.  She tells me that she peed on herself when she had seizure.  She tells me that she has headache,  blurry vision, lightheadedness, dizziness however denies fever, chills, cough, congestion, urinary symptoms such as dysuria, hematuria, foul-smelling urine, palpitation, leg swelling, orthopnea, PND, decreased appetite or sleep changes.  She tells me that although she continues to have nausea and vomiting and abdominal pain she is compliant with her insulin regime and her antiepileptic medication.  She forgot to take Lantus last night and this morning she took her 60 units of Lantus.  History of multiple admission last month with similar symptoms.  She tells me that she hads to find a new neurologist for her seizure disorder as her previous neurologist is retired.  Work-up on previous admission including MRI, EEG, carotid Doppler, transthoracic echo all were negative.  ED Course: Upon arrival: Patient tachycardic, tachypneic, maintaining oxygen saturation on room air, WBC elevated at 13.7, troponin x2 -, blood sugar 440, lipase: WNL, anion gap closed.  Lactic acid/D-dimer, chest x-ray, CT abdomen: WNL.  UA positive for glucose and ketones.  Patient started on insulin drip and received 2 IV fluid boluses.  Hospital Course:  The patient was admitted on an insulin drip. She initially received dilaudid for pain control. The patient's glucoses were brought under control. She advised me that she has not had a BM in 6 days. She has refused further enemas. She is now tolerating her diet. 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 She has not had a BM despite lactulose, miralax, an enema, bisacodyl suppositories. The patient states that she usually only has a BM once a month. She is in no distress and wants to go home. I have discussed the patient with Jennye Moccasin from GI, after  learning that the patient's gastroenterologist, Dr. Adela Lank would be in procedures all afternoon. Maralyn Sago has advised me that is it fine for the patient  to go home and follow up with Dr. Adela Lank as outpatient.   She will follow up with GI as outpatient. Furthermore I have advised that the patient take her insulin, her amitiza and her laculose as prescribed. She has only been taking her amitiza once a day, and her novolog only once a day most of the time.   No further seizures. All work up ( CT, MRI, EEG) was negative. She will continue on her seizure medications as prior to admission and follow up with neurology as outpatient.  Today's assessment: S: The patient is sitting up in bed. She is pleasant and has no new complaints. O: Vitals:  Vitals:   12/22/18 2141 12/23/18 1502  BP: 121/80 119/72  Pulse: 95 98  Resp:  17  Temp: 98.2 F (36.8 C) 98.4 F (36.9 C)  SpO2: 100% 100%   Constitutional:   The patient is awake, alert, and oriented x 3. No acute distress. Respiratory:   No increased work of breathing.  No wheezes, rales, or rhonchi  No tactile fremitus Cardiovascular:   Regular rate and rhythm  No murmurs, ectopy, or gallups.  No lateral PMI. No thrills. Abdomen:   Abdomen is soft, non-tender, non-distended  No hernias, masses, or organomegaly  Normoactive bowel sounds.  Musculoskeletal:   No cyanosis, clubbing, or edema Skin:   No rashes, lesions, ulcers  palpation of skin: no induration or nodules Neurologic:   CN 2-12 intact  Sensation all 4 extremities intact Psychiatric:   Mental status ? Mood, affect appropriate ? Orientation to person, place, time   judgment and insight appear intact   Discharge Instructions  Discharge Instructions    Activity as tolerated - No restrictions   Complete by: As directed    Call MD for:  persistant nausea and vomiting   Complete by: As directed    Diet Carb Modified   Complete by: As directed    Discharge instructions   Complete by: As directed    Hold lactulose for more than 2 BM's a day.  Take amitiza twice daily as prescribed. Follow up with  PCP in 7-10 days. Follow up with Dr. Adela Lank in 2 weeks.   Increase activity slowly   Complete by: As directed      Allergies as of 12/23/2018      Reactions   Ibuprofen Other (See Comments)   GI MD said to not take this anymore   Oatmeal Rash      Medication List    STOP taking these medications   dicyclomine 10 MG capsule Commonly known as: BENTYL     TAKE these medications   atorvastatin 20 MG tablet Commonly known as: LIPITOR Take 1 tablet (20 mg total) by mouth daily at 6 PM. What changed:   how much to take  when to take this   bisacodyl 10 MG suppository Commonly known as: DULCOLAX Place 1 suppository (10 mg total) rectally daily. Start taking on: December 24, 2018   busPIRone 10 MG tablet Commonly known as: BUSPAR Take 10 mg by mouth daily.   cycloSPORINE 0.05 % ophthalmic emulsion Commonly known as: RESTASIS Place 1 drop into both eyes 2 (two) times daily.   hydrOXYzine 50 MG tablet Commonly known as: ATARAX/VISTARIL Take 1 tablet (50 mg total) by mouth 3 (three) times daily as needed for anxiety. What changed: when  to take this   insulin glargine 100 UNIT/ML injection Commonly known as: LANTUS Inject 0.6 mLs (60 Units total) into the skin at bedtime. For diabetes management   insulin lispro 100 UNIT/ML injection Commonly known as: HUMALOG Inject 0.15 mLs (15 Units total) into the skin 2 (two) times daily with a meal. What changed:   when to take this  additional instructions   lactulose 10 GM/15ML solution Commonly known as: CHRONULAC Take 30 mLs (20 g total) by mouth 3 (three) times daily.   lamoTRIgine 25 MG tablet Commonly known as: LAMICTAL Take 2 tablets (50 mg total) by mouth 2 (two) times daily. For mood stabilization   lubiprostone 24 MCG capsule Commonly known as: AMITIZA Take 1 capsule (24 mcg total) by mouth 2 (two) times daily with a meal. What changed: when to take this   meloxicam 7.5 MG tablet Commonly known as:  MOBIC Take 7.5 mg by mouth daily with lunch.   metoCLOPramide 10 MG tablet Commonly known as: REGLAN Take 1 tablet (10 mg total) by mouth 4 (four) times daily -  before meals and at bedtime. What changed: when to take this   mirtazapine 7.5 MG tablet Commonly known as: REMERON Take 1 tablet (7.5 mg total) by mouth at bedtime. For depression/sleep   multivitamin with minerals Tabs tablet Take 1 tablet by mouth daily.   pantoprazole 40 MG tablet Commonly known as: PROTONIX Take 1 tablet (40 mg total) by mouth 2 (two) times daily. For acid reflux   Pazeo 0.7 % Soln Generic drug: Olopatadine HCl Place 2 drops into both eyes 2 (two) times daily.   polyethylene glycol 17 g packet Commonly known as: MIRALAX / GLYCOLAX Take 17 g by mouth daily. Start taking on: December 24, 2018   prochlorperazine 25 MG suppository Commonly known as: COMPAZINE Place 1 suppository (25 mg total) rectally every 12 (twelve) hours as needed for nausea or vomiting.   promethazine 25 MG suppository Commonly known as: PHENERGAN Place 25 mg rectally every 8 (eight) hours as needed for nausea or vomiting.   propranolol 20 MG tablet Commonly known as: INDERAL Take 1 tablet (20 mg total) by mouth 2 (two) times daily. For anxiety/HTN   QUEtiapine 200 MG tablet Commonly known as: SEROQUEL Take 400 mg by mouth at bedtime.   topiramate 25 MG tablet Commonly known as: TOPAMAX Take 1 tablet (25 mg total) by mouth 2 (two) times daily. For seizure activities      Allergies  Allergen Reactions   Ibuprofen Other (See Comments)    GI MD said to not take this anymore   Oatmeal Rash    The results of significant diagnostics from this hospitalization (including imaging, microbiology, ancillary and laboratory) are listed below for reference.    Significant Diagnostic Studies: EEG  Result Date: 12/01/2018 Charlsie Quest, MD     12/01/2018  1:23 PM Patient Name: CHIKITA ALEXIS MRN: [id number] Epilepsy  Attending: Charlsie Quest Referring Physician/Provider: Dr. Pearson Grippe Date: 12/01/2018 Duration: 25.36 minutes Patient history: 18 year old female with syncopal episode and possible seizure-like activity.  EEG to evaluate for seizures. Level of alertness: Awake, asleep AEDs during EEG study: Lamotrigine, topiramate Technical aspects: This EEG study was done with scalp electrodes positioned according to the 10-20 International system of electrode placement. Electrical activity was acquired at a sampling rate of 500Hz  and reviewed with a high frequency filter of 70Hz  and a low frequency filter of 1Hz . EEG data were recorded continuously and digitally stored.  Description: During awake state, no clear posterior dominant rhythm was seen.  Sleep was characterized by vertex waves, sleep spindles (12 to 14 Hz), maximal frontocentral.  Physiologic photic driving was not seen during photic stimulation.  Generalized 3 to 5 Hz theta-delta slowing was seen during hyperventilation. IMPRESSION: This study is within normal limits. No seizures or epileptiform discharges were seen throughout the recording. Charlsie Quest   DG Chest 2 View  Result Date: 12/19/2018 CLINICAL DATA:  Chest pain EXAM: CHEST - 2 VIEW COMPARISON:  11/07/2018 FINDINGS: Shallow lung inflation. Normal cardiomediastinal contours. No focal airspace consolidation or pulmonary edema. No pleural effusion or pneumothorax. IMPRESSION: No active cardiopulmonary disease. Electronically Signed   By: Deatra Robinson M.D.   On: 12/19/2018 00:42   CT Head Wo Contrast  Result Date: 11/30/2018 CLINICAL DATA:  Syncope, altered LOC EXAM: CT HEAD WITHOUT CONTRAST TECHNIQUE: Contiguous axial images were obtained from the base of the skull through the vertex without intravenous contrast. COMPARISON:  CT brain 10/30/2018 FINDINGS: Brain: No evidence of acute infarction, hemorrhage, hydrocephalus, extra-axial collection or mass lesion/mass effect. Vascular: No hyperdense  vessel or unexpected calcification. Skull: Normal. Negative for fracture or focal lesion. Sinuses/Orbits: Mild mucosal thickening in the sinuses. Other: None IMPRESSION: Negative non contrasted CT appearance of the brain Electronically Signed   By: Jasmine Pang M.D.   On: 11/30/2018 22:51   MR BRAIN WO CONTRAST  Result Date: 12/01/2018 CLINICAL DATA:  18 year old female with altered mental status. Seizure versus pseudo seizure. Type 1 diabetes with recent hyperglycemia. EXAM: MRI HEAD WITHOUT CONTRAST TECHNIQUE: Multiplanar, multiecho pulse sequences of the brain and surrounding structures were obtained without intravenous contrast. COMPARISON:  Head CT without contrast 11/30/2018. Brain MRI 11/24/2017. FINDINGS: Brain: Stable cerebral volume since 2019. Angular appearance of the ventricles, which can be seen with periventricular leukomalacia (and this patient has a history of pre term birth per the EMR). No restricted diffusion to suggest acute infarction. No midline shift, mass effect, evidence of mass lesion, ventriculomegaly, extra-axial collection or acute intracranial hemorrhage. Cervicomedullary junction and pituitary are within normal limits. Thin slice coronal imaging. The hippocampal formations appear symmetric and within normal limits. Bilateral temporal lobe signal elsewhere also appears normal. Wallace Cullens and white matter signal is within normal limits throughout the brain. No cortical encephalomalacia. No chronic cerebral blood products identified. Deep gray nuclei, brainstem and cerebellum appear normal. Vascular: Major intracranial vascular flow voids are stable and appear normal. Skull and upper cervical spine: Negative visible cervical spine. Visualized bone marrow signal is within normal limits. Sinuses/Orbits: Negative orbits, mild motion artifact. Trace paranasal sinus mucosal thickening is stable since 2019. Other: Mastoids remain clear. Visible internal auditory structures appear normal. Scalp  and face soft tissues appear negative. IMPRESSION: 1. No acute intracranial abnormality. 2. Stable since 2019 and negative non-contrast MRI appearance of the brain aside from suggestion of some perinatal periventricular leukomalacia. Electronically Signed   By: Odessa Fleming M.D.   On: 12/01/2018 05:29   CT Abdomen Pelvis W Contrast  Result Date: 12/19/2018 CLINICAL DATA:  Back pain.  Flank pain. EXAM: CT ABDOMEN AND PELVIS WITH CONTRAST TECHNIQUE: Multidetector CT imaging of the abdomen and pelvis was performed using the standard protocol following bolus administration of intravenous contrast. CONTRAST:  OMNIPAQUE IOHEXOL 300 MG/ML  SOLN COMPARISON:  CT 10/08/2018 FINDINGS: Patient scanned in nonstandard position due to back pain Lower chest: Lung bases are clear. Hepatobiliary: No focal hepatic lesion. No biliary duct dilatation. Gallbladder is normal. Common bile  duct is normal. Pancreas: Pancreas is normal. No ductal dilatation. No pancreatic inflammation. Spleen: Normal spleen Adrenals/urinary tract: Adrenal glands and kidneys are normal. The ureters and bladder normal. Bladder mildly distended. Stomach/Bowel: Stomach, small bowel, appendix, and cecum are normal. The colon and rectosigmoid colon are normal. Vascular/Lymphatic: Abdominal aorta is normal caliber. No periportal or retroperitoneal adenopathy. No pelvic adenopathy. Reproductive: Uterus and adnexa normal. Other: No free fluid or free air. Musculoskeletal: No aggressive osseous lesion. IMPRESSION: 1. No explanation for back pain.  No obstructive uropathy. 2. Normal appendix. 3. Bladder is distended. Electronically Signed   By: Genevive BiStewart  Edmunds M.D.   On: 12/19/2018 11:35   ECHOCARDIOGRAM COMPLETE  Result Date: 12/01/2018   ECHOCARDIOGRAM REPORT   Patient Name:   Denice ParadiseRASHONDA N Zendejas Date of Exam: 12/01/2018 Medical Rec #:  [id number]        Height:       63.0 in Accession #:    [id number]       Weight:       198.0 lb Date of Birth:  December 31, 2000         BSA:          1.93 m Patient Age:    18 years         BP:           101/55 mmHg Patient Gender: F                HR:           126 bpm. Exam Location:  Inpatient Procedure: 2D Echo Indications:    Syncope 780.2 / R55  History:        Patient has prior history of Echocardiogram examinations, most                 recent 10/11/2018. CHF; Risk Factors:Diabetes. Seizure/ and/or                 pseudoseizures.  Sonographer:    Leta Junglingiffany Cooper RDCS Referring Phys: [phone] Pearson GrippeJAMES KIM  Sonographer Comments: IMPRESSIONS  1. Left ventricular ejection fraction, by visual estimation, is 65 to 70%. The left ventricle has hyperdynamic function. There is mildly increased left ventricular hypertrophy.  2. Left ventricular diastolic parameters are indeterminate, howevere medial annular tissue Doppler suggests normal diastolic function.  3. Global right ventricle has normal systolic function.The right ventricular size is normal. No increase in right ventricular wall thickness.  4. Left atrial size was normal.  5. Right atrial size was normal.  6. The mitral valve is normal in structure. Trace mitral valve regurgitation. No evidence of mitral stenosis.  7. The tricuspid valve is normal in structure. Tricuspid valve regurgitation is trivial.  8. The aortic valve is normal in structure. Aortic valve regurgitation is trivial. No evidence of aortic valve sclerosis or stenosis.  9. The pulmonic valve was normal in structure. Pulmonic valve regurgitation is not visualized. 10. The inferior vena cava is normal in size with greater than 50% respiratory variability, suggesting right atrial pressure of 3 mmHg. FINDINGS  Left Ventricle: Left ventricular ejection fraction, by visual estimation, is 65 to 70%. The left ventricle has hyperdynamic function. There is mildly increased left ventricular hypertrophy. Left ventricular diastolic parameters are indeterminate. Normal  left atrial pressure. Right Ventricle: The right ventricular size is normal.  No increase in right ventricular wall thickness. Global RV systolic function is has normal systolic function. Left Atrium: Left atrial size was normal in size. Right Atrium: Right atrial size was normal  in size Pericardium: There is no evidence of pericardial effusion. Mitral Valve: The mitral valve is normal in structure. No evidence of mitral valve stenosis by observation. Trace mitral valve regurgitation. Tricuspid Valve: The tricuspid valve is normal in structure. Tricuspid valve regurgitation is trivial. Aortic Valve: The aortic valve is normal in structure. Aortic valve regurgitation is trivial. The aortic valve is structurally normal, with no evidence of sclerosis or stenosis. Pulmonic Valve: The pulmonic valve was normal in structure. Pulmonic valve regurgitation is not visualized. Aorta: The aortic root, ascending aorta and aortic arch are all structurally normal, with no evidence of dilitation or obstruction. Venous: The inferior vena cava is normal in size with greater than 50% respiratory variability, suggesting right atrial pressure of 3 mmHg. IAS/Shunts: No atrial level shunt detected by color flow Doppler. No ventricular septal defect is seen or detected. There is no evidence of an atrial septal defect.  LEFT VENTRICLE PLAX 2D LVIDd:         4.46 cm  Diastology LVIDs:         2.84 cm  LV e' lateral: 7.51 cm/s LV PW:         1.09 cm  LV e' medial:  12.00 cm/s LV IVS:        1.09 cm LVOT diam:     2.20 cm LV SV:         60 ml LV SV Index:   29.41 LVOT Area:     3.80 cm  RIGHT VENTRICLE RV S prime:     18.80 cm/s TAPSE (M-mode): 1.7 cm LEFT ATRIUM             Index       RIGHT ATRIUM           Index LA diam:        4.00 cm 2.08 cm/m  RA Area:     11.60 cm LA Vol (A2C):   35.0 ml 18.18 ml/m RA Volume:   23.70 ml  12.31 ml/m LA Vol (A4C):   34.7 ml 18.02 ml/m LA Biplane Vol: 36.4 ml 18.91 ml/m  AORTIC VALVE LVOT Vmax:   122.00 cm/s LVOT Vmean:  88.600 cm/s LVOT VTI:    0.195 m  AORTA Ao Root diam:  2.60 cm Ao Asc diam:  2.80 cm  SHUNTS Systemic VTI:  0.20 m Systemic Diam: 2.20 cm  Cherlynn Kaiser MD Electronically signed by Cherlynn Kaiser MD Signature Date/Time: 12/01/2018/12:08:29 PM    Final    VAS US CAROTID  Result Date: 12/01/2018 Carotid Arterial Duplex Study Indications:  Syncope. Risk Factors: None. Performing Technologist: June Leap RDMS, RVT  Examination Guidelines: A complete evaluation includes B-mode imaging, spectral Doppler, color Doppler, and power Doppler as needed of all accessible portions of each vessel. Bilateral testing is considered an integral part of a complete examination. Limited examinations for reoccurring indications may be performed as noted.  Right Carotid Findings: +----------+--------+--------+--------+------------------+--------+             PSV cm/s EDV cm/s Stenosis Plaque Description Comments  +----------+--------+--------+--------+------------------+--------+  CCA Prox   151      36                                             +----------+--------+--------+--------+------------------+--------+  CCA Distal 105      23                                             +----------+--------+--------+--------+------------------+--------+  ICA Prox   108      34       Normal                                +----------+--------+--------+--------+------------------+--------+  ICA Distal 101      24                                             +----------+--------+--------+--------+------------------+--------+  ECA        145      26                                             +----------+--------+--------+--------+------------------+--------+ +----------+--------+-------+----------------+-------------------+             PSV cm/s EDV cms Describe         Arm Pressure (mmHG)  +----------+--------+-------+----------------+-------------------+  Subclavian 291              Multiphasic, WNL                      +----------+--------+-------+----------------+-------------------+  +---------+--------+--+--------+--+---------+  Vertebral PSV cm/s 80 EDV cm/s 22 Antegrade  +---------+--------+--+--------+--+---------+  Left Carotid Findings: +----------+--------+--------+--------+------------------+--------+             PSV cm/s EDV cm/s Stenosis Plaque Description Comments  +----------+--------+--------+--------+------------------+--------+  CCA Prox   200      55                                             +----------+--------+--------+--------+------------------+--------+  CCA Distal 165      32                                             +----------+--------+--------+--------+------------------+--------+  ICA Prox   155      52       Normal                                +----------+--------+--------+--------+------------------+--------+  ICA Distal 107      32                                             +----------+--------+--------+--------+------------------+--------+  ECA        166      32                                             +----------+--------+--------+--------+------------------+--------+ +----------+--------+--------+----------------+-------------------+             PSV cm/s EDV cm/s Describe         Arm Pressure (mmHG)  +----------+--------+--------+----------------+-------------------+  Subclavian 269  Multiphasic, WNL                      +----------+--------+--------+----------------+-------------------+ +---------+--------+--+--------+--+---------+  Vertebral PSV cm/s 65 EDV cm/s 19 Antegrade  +---------+--------+--+--------+--+---------+  Summary: Right Carotid: There is no evidence of stenosis in the right ICA. There was no                evidence of thrombus, dissection, atherosclerotic plaque or                stenosis in the cervical carotid system. Left Carotid: There is no evidence of stenosis in the left ICA. There was no               evidence of thrombus, dissection, atherosclerotic plaque or               stenosis in the cervical carotid system.   *See table(s) above for measurements and observations.  Electronically signed by Lemar Livings MD on 12/01/2018 at 1:53:58 PM.    Final     Microbiology: Recent Results (from the past 240 hour(s))  SARS CORONAVIRUS 2 (TAT 6-24 HRS) Nasopharyngeal Nasopharyngeal Swab     Status: None   Collection Time: 12/19/18  1:39 PM   Specimen: Nasopharyngeal Swab  Result Value Ref Range Status   SARS Coronavirus 2 NEGATIVE NEGATIVE Final    Comment: (NOTE) SARS-CoV-2 target nucleic acids are NOT DETECTED. The SARS-CoV-2 RNA is generally detectable in upper and lower respiratory specimens during the acute phase of infection. Negative results do not preclude SARS-CoV-2 infection, do not rule out co-infections with other pathogens, and should not be used as the sole basis for treatment or other patient management decisions. Negative results must be combined with clinical observations, patient history, and epidemiological information. The expected result is Negative. Fact Sheet for Patients: HairSlick.no Fact Sheet for Healthcare Providers: quierodirigir.com This test is not yet approved or cleared by the [place] FDA and  has been authorized for detection and/or diagnosis of SARS-CoV-2 by FDA under an Emergency Use Authorization (EUA). This EUA will remain  in effect (meaning this test can be used) for the duration of the COVID-19 declaration under Section 56 4(b)(1) of the Act, 21 U.S.C. section 360bbb-3(b)(1), unless the authorization is terminated or revoked sooner. Performed at [facility], [address] [place], [place] [address]   Culture, blood (routine x 2)     Status: None (Preliminary result)   Collection Time: 12/19/18  8:42 PM   Specimen: BLOOD  Result Value Ref Range Status   Specimen Description BLOOD RIGHT ARM  Final   Special Requests   Final    BOTTLES DRAWN AEROBIC ONLY Blood Culture results may not be  optimal due to an inadequate volume of blood received in culture bottles   Culture   Final    NO GROWTH 4 DAYS Performed at [facility], [address], [place], [place] [address]    Report Status PENDING  Incomplete  Culture, blood (routine x 2)     Status: None (Preliminary result)   Collection Time: 12/19/18  8:42 PM   Specimen: BLOOD  Result Value Ref Range Status   Specimen Description BLOOD LEFT ARM  Final   Special Requests   Final    BOTTLES DRAWN AEROBIC AND ANAEROBIC Blood Culture adequate volume   Culture   Final    NO GROWTH 4 DAYS Performed at [facility], [address] [place], [place]  [address]    Report Status PENDING  Incomplete     Labs: Basic Metabolic Panel: Recent Labs  Lab 12/19/18 0020 12/19/18 0921 12/19/18 1725 12/20/18 0415 12/21/18 0425 12/22/18 0355 12/23/18 0354  NA 136 139  --  135 137 139 140  K 4.2 4.8  --  3.6 3.9 4.1 3.6  CL 99  --   --  103 105 105 105  CO2 23  --   --  22 24 26 24   GLUCOSE 440*  --   --  347* 269* 229* 153*  BUN 13  --   --  9 17 16 12   CREATININE 0.81  --   --  0.61 0.61 0.72 0.58  CALCIUM 9.5  --   --  8.5* 8.4* 9.3 9.2  MG  --   --  2.1  --   --   --   --   PHOS  --   --  3.6  --   --   --   --    Liver Function Tests: Recent Labs  Lab 12/19/18 0020 12/20/18 0415  AST 11* 10*  ALT 16 11  ALKPHOS 86 65  BILITOT 0.4 0.6  PROT 6.9 5.5*  ALBUMIN 4.2 3.2*   Recent Labs  Lab 12/19/18 0020  LIPASE 31   No results for input(s): AMMONIA in the last 168 hours. CBC: Recent Labs  Lab 12/19/18 0020 12/19/18 0921 12/20/18 0415 12/22/18 0355 12/23/18 0354  WBC 13.7*  --  10.7* 9.5 9.7  NEUTROABS  --   --   --   --  4.7  HGB 12.5 14.6 11.0* 12.0 12.4  HCT 38.2 43.0 34.4* 36.9 38.0  MCV 82.9  --  84.1 83.7 82.8  PLT 319  --  266 283 285   Cardiac Enzymes: No results for input(s): CKTOTAL, CKMB, CKMBINDEX, TROPONINI in the last 168 hours. BNP: BNP (last 3 results) Recent Labs     01/29/18 2134 09/10/18 1424 09/12/18 2244  BNP 12.4 13.1 13.8    ProBNP (last 3 results) No results for input(s): PROBNP in the last 8760 hours.  CBG: Recent Labs  Lab 12/22/18 1646 12/22/18 2139 12/23/18 0728 12/23/18 1158 12/23/18 1501  GLUCAP 268* 217* 186* 307* 288*    Active Problems:   Obesity   Hypertension   Nausea and vomiting   Generalized abdominal pain   Chest pain   Tachycardia   Syncope   Depression   Seizures (HCC)   Uncontrolled diabetes mellitus with hyperglycemia (HCC)   Leukocytosis   Dehydration   Time coordinating discharge: 38 minutes.  Signed:        Coleby Yett, DO [facility]  12/23/2018, 4:40 PM

## 2018-12-24 ENCOUNTER — Telehealth: Payer: Self-pay

## 2018-12-24 LAB — CULTURE, BLOOD (ROUTINE X 2)
Culture: NO GROWTH
Culture: NO GROWTH
Special Requests: ADEQUATE

## 2018-12-24 IMAGING — CT CT ABD-PELV W/ CM
2 of 4 series · 13 of 36 positions shown, 16 images · IV contrast (omnipaque)
Comparison: CT chest 12/14/2017. CT abdomen and pelvis 06/12/2017.

CLINICAL DATA: Abdominal pain and chest pain for about 3 weeks.
Nausea and loss of consciousness. Type 2 diabetes.

EXAM:
CT CHEST, ABDOMEN, AND PELVIS WITH CONTRAST
TECHNIQUE: Multidetector CT imaging of the chest, abdomen and pelvis was
performed following the standard protocol during bolus
administration of intravenous contrast.
CONTRAST:  100mL OMNIPAQUE IOHEXOL 300 MG/ML  SOLN

[Series 3: cap with 5mm st · axial · 0.64mm/px · z∈[+780,+1305]mm · 10 of 125 slices shown, 13 images]
[im 10/125  mediastinal]
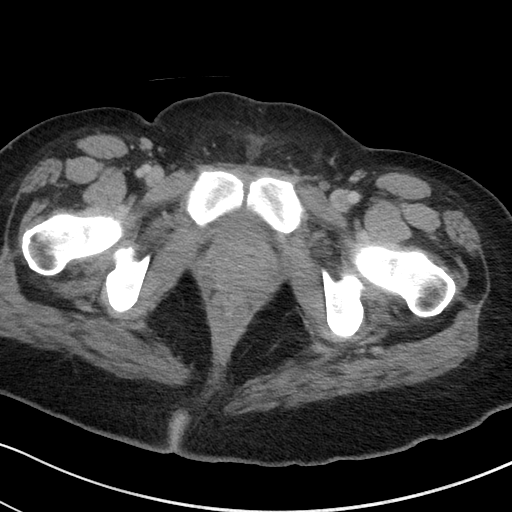
[im 10/125  lung]
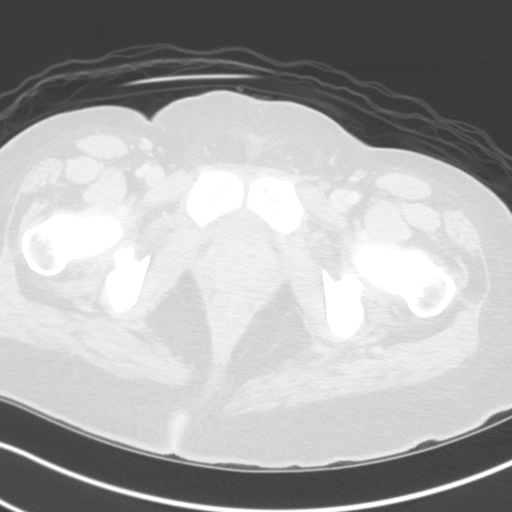
[im 20/125  lung]
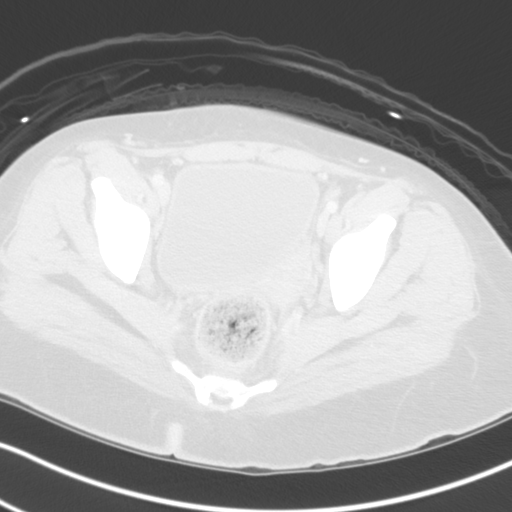
[im 39/125  lung]
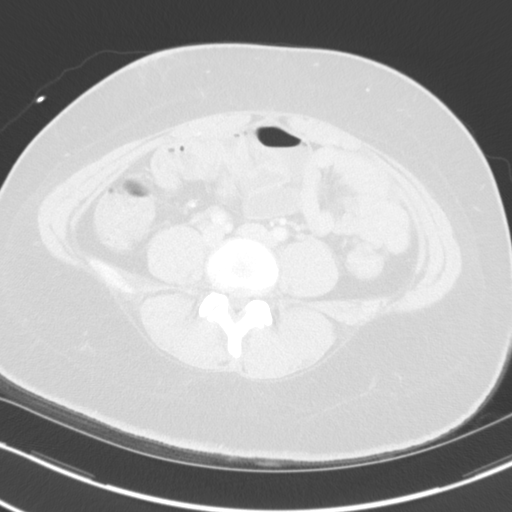
[im 48/125  lung]
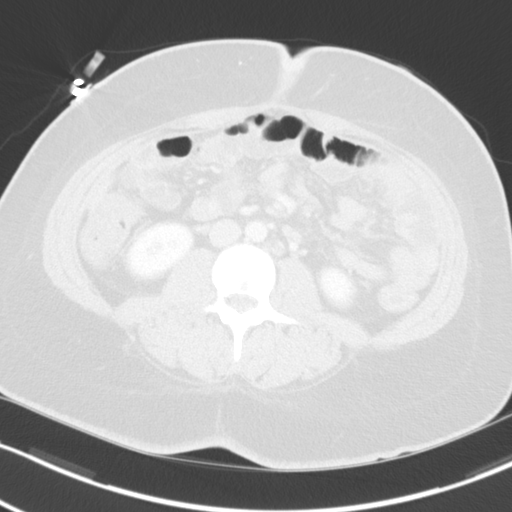
[im 58/125  mediastinal]
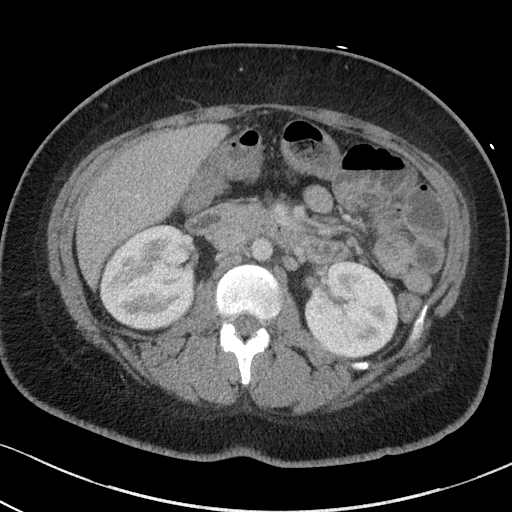
[im 58/125  lung]
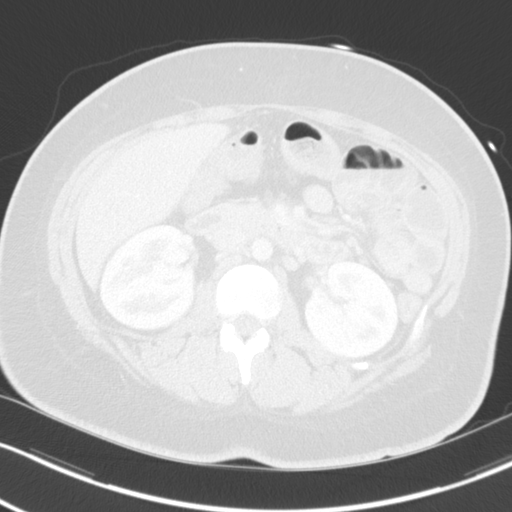
[im 67/125  lung]
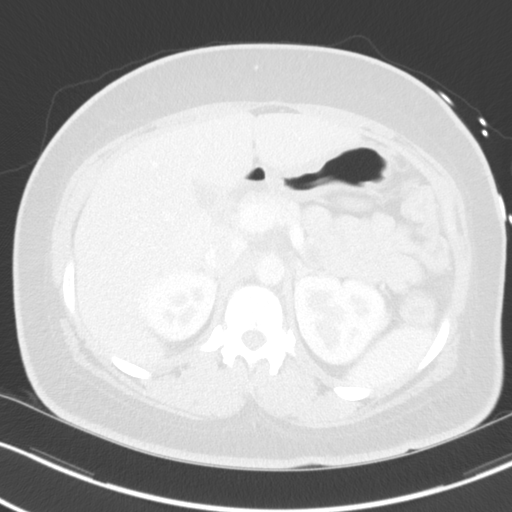
[im 77/125  lung]
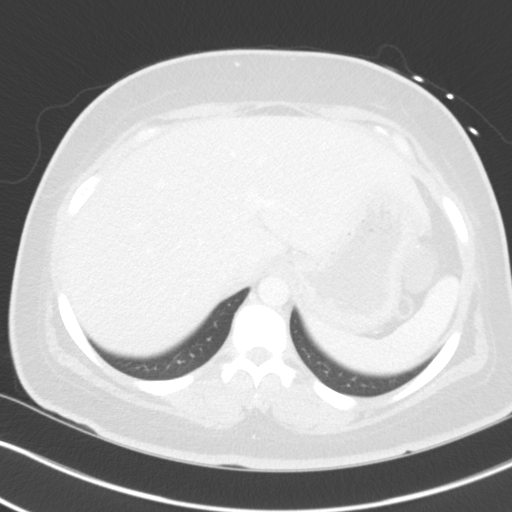
[im 96/125  lung]
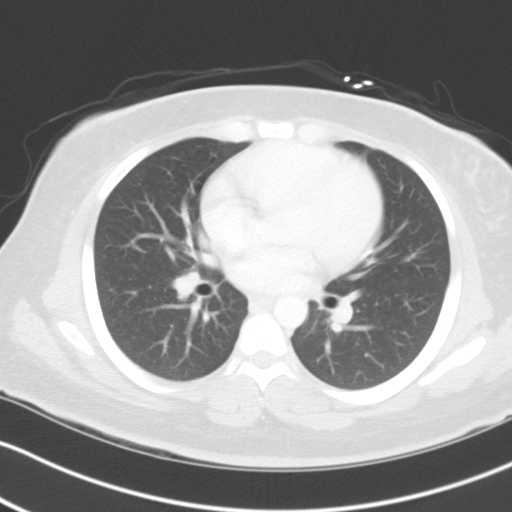
[im 105/125  mediastinal]
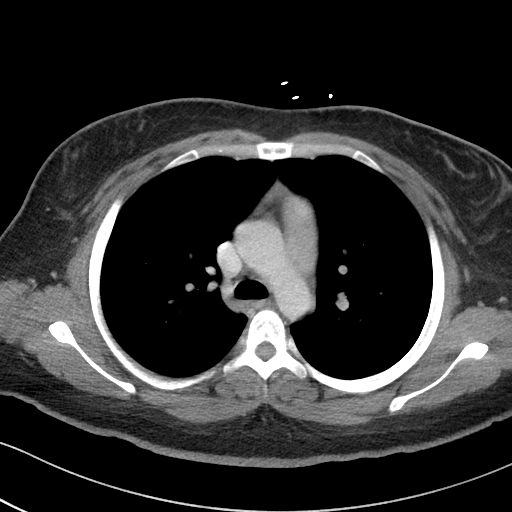
[im 105/125  lung]
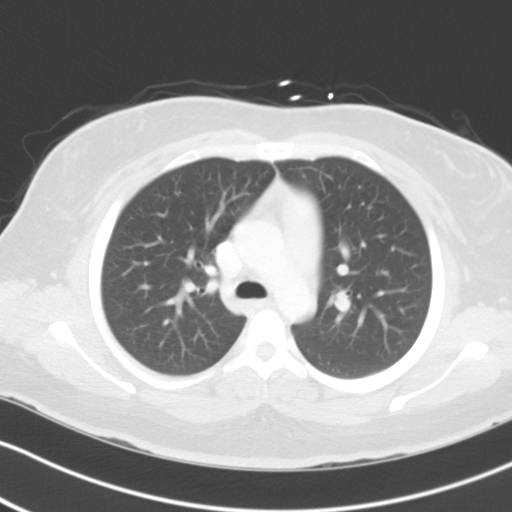
[im 115/125  lung]
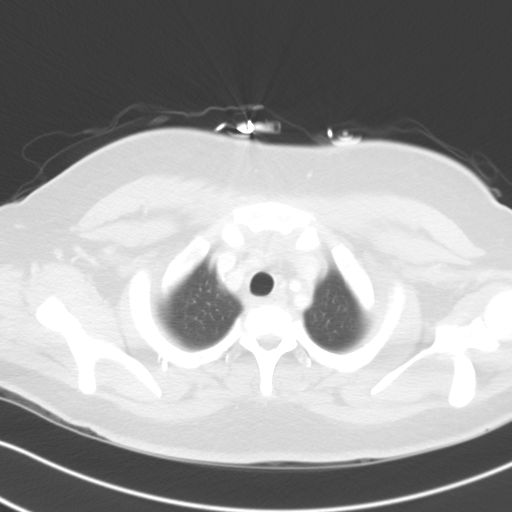

[Series 6: cap with 3mm st cor · coronal · 0.64mm/px · 3 of 123 slices shown]
[im 25/123  lung]
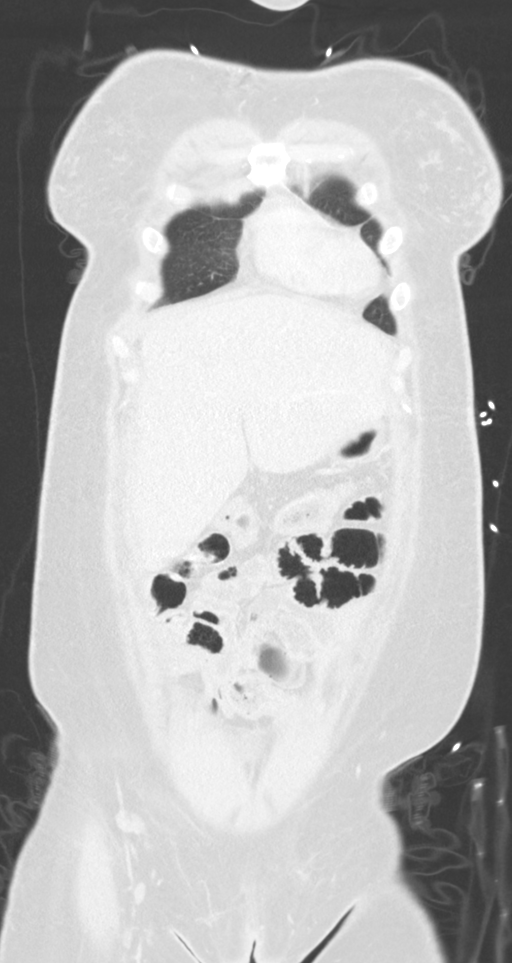
[im 49/123  lung]
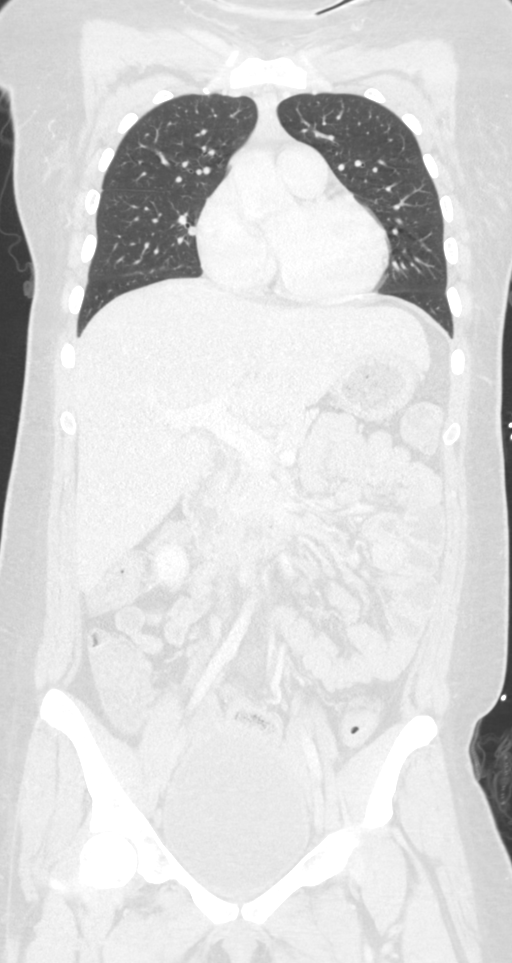
[im 74/123  lung]
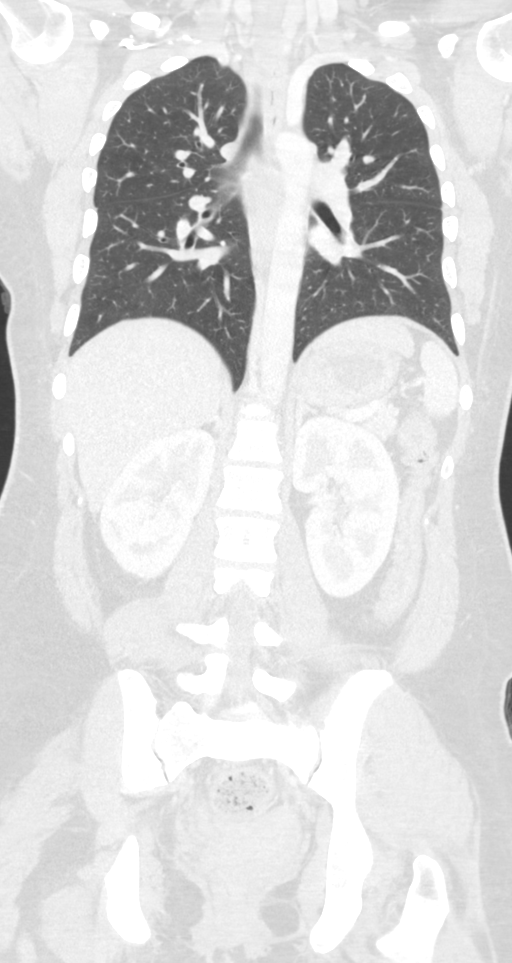

[13 of 36 positions shown; findings below may reference images not displayed]

FINDINGS: CT CHEST FINDINGS

Cardiovascular: Normal heart size. No pericardial effusions. Normal
caliber thoracic aorta. Great vessel origins are patent.

Mediastinum/Nodes: Esophagus is decompressed. No significant
lymphadenopathy in the chest. Residual thymic tissue in the anterior
mediastinum.

Lungs/Pleura: Lungs are clear. No airspace disease or consolidation
identified. No pleural effusions. No pneumothorax. Airways are
patent.

Musculoskeletal: No chest wall mass or suspicious bone lesions
identified.

CT ABDOMEN PELVIS FINDINGS

Hepatobiliary: No focal liver abnormality is seen. No gallstones,
gallbladder wall thickening, or biliary dilatation.

Pancreas: Unremarkable. No pancreatic ductal dilatation or
surrounding inflammatory changes.

Spleen: Normal in size without focal abnormality.

Adrenals/Urinary Tract: Adrenal glands are unremarkable. Kidneys are
normal, without renal calculi, focal lesion, or hydronephrosis.
Bladder is unremarkable.

Stomach/Bowel: Stomach, small bowel, and colon are not abnormally
distended. No wall thickening or inflammatory changes appreciated.
Appendix is normal.

Vascular/Lymphatic: No significant vascular findings are present. No
enlarged abdominal or pelvic lymph nodes.

Reproductive: Uterus and ovaries are not enlarged. Small vaginal
cyst likely a Bartholin's gland cyst.

Other: No free air or free fluid in the abdomen. Abdominal wall
musculature appears intact.

Musculoskeletal: No acute or significant osseous findings.
IMPRESSION: No acute process demonstrated in the chest, abdomen, or pelvis.

## 2018-12-24 NOTE — Telephone Encounter (Signed)
Transition Care Management Follow-up Telephone Call  Date of discharge and from where: 12/23/2018, [facility]  This CM spoke to patient. She said that she is " feeling pretty good." she has all medications as well as discharge instructions and did not have any questions or want to review the medication list. She said that her blood sugar was 112 this morning and she is checking/ keeping a log of blood sugars three times daily.   When asked about scheduling a follow up appointment, she said that she actually has 2 PCPs -  [facility] is where she  normally goes and she has an appt 01/06/2019.  [facility] is her back up.

## 2018-12-26 NOTE — Telephone Encounter (Signed)
Bien please let her know she can not have 2 PCPs. She has to choose between the 2 clinics. Thanks.

## 2018-12-29 NOTE — Telephone Encounter (Signed)
Spoke to patient. Pt. Stated she is going to continue seeing the [facility] instead of [facility].

## 2018-12-30 ENCOUNTER — Emergency Department (HOSPITAL_COMMUNITY)
Admission: EM | Admit: 2018-12-30 | Discharge: 2018-12-30 | Disposition: A | Discharge status: Home / Self Care | Payer: Medicaid Other | Attending: Emergency Medicine | Admitting: Emergency Medicine

## 2018-12-30 ENCOUNTER — Encounter (HOSPITAL_COMMUNITY): Payer: Self-pay

## 2018-12-30 ENCOUNTER — Other Ambulatory Visit: Payer: Self-pay

## 2018-12-30 DIAGNOSIS — I509 Heart failure, unspecified: Secondary | ICD-10-CM | POA: Insufficient documentation

## 2018-12-30 DIAGNOSIS — R0602 Shortness of breath: Secondary | ICD-10-CM | POA: Diagnosis not present

## 2018-12-30 DIAGNOSIS — E119 Type 2 diabetes mellitus without complications: Secondary | ICD-10-CM | POA: Diagnosis not present

## 2018-12-30 DIAGNOSIS — Z79899 Other long term (current) drug therapy: Secondary | ICD-10-CM | POA: Diagnosis not present

## 2018-12-30 DIAGNOSIS — Z794 Long term (current) use of insulin: Secondary | ICD-10-CM | POA: Insufficient documentation

## 2018-12-30 DIAGNOSIS — R519 Headache, unspecified: Secondary | ICD-10-CM | POA: Insufficient documentation

## 2018-12-30 DIAGNOSIS — R112 Nausea with vomiting, unspecified: Secondary | ICD-10-CM | POA: Diagnosis not present

## 2018-12-30 DIAGNOSIS — R1084 Generalized abdominal pain: Secondary | ICD-10-CM | POA: Diagnosis present

## 2018-12-30 LAB — URINALYSIS, ROUTINE W REFLEX MICROSCOPIC
Bacteria, UA: NONE SEEN
Bilirubin Urine: NEGATIVE
Glucose, UA: >=500 mg/dL — AB
Hgb urine dipstick: NEGATIVE
Ketones, ur: 5 mg/dL — AB
Leukocytes,Ua: NEGATIVE
Nitrite: NEGATIVE
Protein, ur: NEGATIVE mg/dL
Specific Gravity, Urine: 1.024 (ref 1.005–1.030)
pH: 7 (ref 5.0–8.0)

## 2018-12-30 LAB — COMPREHENSIVE METABOLIC PANEL
ALT: 24 U/L (ref 0–44)
AST: 14 U/L — ABNORMAL LOW (ref 15–41)
Albumin: 3.6 g/dL (ref 3.5–5.0)
Alkaline Phosphatase: 73 U/L (ref 38–126)
Anion gap: 12 (ref 5–15)
BUN: 7 mg/dL (ref 6–20)
CO2: 24 mmol/L (ref 22–32)
Calcium: 8.9 mg/dL (ref 8.9–10.3)
Chloride: 100 mmol/L (ref 98–111)
Creatinine, Ser: 1.13 mg/dL — ABNORMAL HIGH (ref 0.44–1.00)
GFR calc Af Amer: >60 mL/min (ref >60)
GFR calc non Af Amer: >60 mL/min (ref >60)
Glucose, Bld: 276 mg/dL — ABNORMAL HIGH (ref 70–99)
Potassium: 4.1 mmol/L (ref 3.5–5.1)
Sodium: 136 mmol/L (ref 135–145)
Total Bilirubin: 0.5 mg/dL (ref 0.3–1.2)
Total Protein: 6.4 g/dL — ABNORMAL LOW (ref 6.5–8.1)

## 2018-12-30 LAB — CBC
HCT: 40.5 % (ref 36.0–46.0)
Hemoglobin: 12.8 g/dL (ref 12.0–15.0)
MCH: 26.7 pg (ref 26.0–34.0)
MCHC: 31.6 g/dL (ref 30.0–36.0)
MCV: 84.4 fL (ref 80.0–100.0)
Platelets: 385 10*3/uL (ref 150–400)
RBC: 4.8 MIL/uL (ref 3.87–5.11)
RDW: 13.6 % (ref 11.5–15.5)
WBC: 8.8 10*3/uL (ref 4.0–10.5)
nRBC: 0 % (ref 0.0–0.2)

## 2018-12-30 LAB — I-STAT BETA HCG BLOOD, ED (MC, WL, AP ONLY): I-stat hCG, quantitative: <5 m[IU]/mL (ref <5)

## 2018-12-30 LAB — LIPASE, BLOOD: Lipase: 28 U/L (ref 11–51)

## 2018-12-30 MED ORDER — HYDROMORPHONE HCL 1 MG/ML IJ SOLN
1.0000 mg | Freq: Once | INTRAMUSCULAR | Status: AC
  Administered 2018-12-30: 1 mg via INTRAMUSCULAR

## 2018-12-30 MED ORDER — HYDROMORPHONE HCL 1 MG/ML IJ SOLN
1.0000 mg | Freq: Once | INTRAMUSCULAR | Status: DC
  Filled 2018-12-30: qty 1

## 2018-12-30 MED ORDER — DIPHENHYDRAMINE HCL 50 MG/ML IJ SOLN
25.0000 mg | Freq: Once | INTRAMUSCULAR | Status: AC
  Administered 2018-12-30: 25 mg via INTRAVENOUS
  Filled 2018-12-30: qty 1

## 2018-12-30 MED ORDER — HYDROMORPHONE HCL 1 MG/ML IJ SOLN
1.0000 mg | Freq: Once | INTRAMUSCULAR | Status: AC
  Administered 2018-12-30: 1 mg via INTRAVENOUS
  Filled 2018-12-30: qty 1

## 2018-12-30 MED ORDER — SODIUM CHLORIDE 0.9 % IV BOLUS
1000.0000 mL | Freq: Once | INTRAVENOUS | Status: AC
  Administered 2018-12-30: 1000 mL via INTRAVENOUS

## 2018-12-30 MED ORDER — PROCHLORPERAZINE EDISYLATE 10 MG/2ML IJ SOLN
10.0000 mg | Freq: Once | INTRAMUSCULAR | Status: AC
  Administered 2018-12-30: 10 mg via INTRAVENOUS
  Filled 2018-12-30: qty 2

## 2018-12-30 MED ORDER — METOCLOPRAMIDE HCL 10 MG PO TABS
10.0000 mg | ORAL_TABLET | Freq: Once | ORAL | Status: AC
  Administered 2018-12-30: 10 mg via ORAL
  Filled 2018-12-30: qty 1

## 2018-12-30 MED ORDER — ONDANSETRON HCL 4 MG/2ML IJ SOLN
4.0000 mg | Freq: Once | INTRAMUSCULAR | Status: DC
  Filled 2018-12-30: qty 2

## 2018-12-30 NOTE — ED Triage Notes (Signed)
Pt from home with ems c.o generalized abd pain for 3 days, hx of seizures and states she had 1 about an hr ago witnessed by mother. Recent change in seizure medication.  Pt now a.o, ambulatory.     P 120 CBG 319

## 2018-12-30 NOTE — ED Notes (Signed)
Patient verbalizes understanding of discharge instructions. Opportunity for questioning and answers were provided. pt discharged from ED. Ambulatory by self  

## 2018-12-30 NOTE — ED Provider Notes (Signed)
[facility] EMERGENCY DEPARTMENT Provider Note   CSN: [id number] Arrival date & time: 12/30/18  1255     History Chief Complaint  Patient presents with  . Abdominal Pain  . Seizures    Nancy [name] is a 18 y.o. female with history of seizures, insulin dependent DM, chronic headaches, chronic constipation presents with seizures, headache, chest pain, abdominal pain. Has been seen in the ED frequently for the same problems. Had 28 visits in the last 6 months.  Seizure - she reports she has a seizure every other day typically. She is on Keppra and Lamictal and hasn't missed any doses. Over the past couple of days she has had multiple daily seizures. Her mom told her since she is having more seizures she should come to the hospital. 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  Headache - She has daily headaches but then also says her headache started last night and has gradually worsened. She feels that her head is "banging". She takes Topamax for migraines. She states it's a generalized headache but then also states it's more in the front. She had a negative head CT and MRI brain in November and December respectively.   Chest pain - Having chest pain for 3-4 weeks. She reports it is left sided. It comes and go at random. It feels like a sharp stabbing pain. She reports associated SOB. She was having this pain in the hospital a couple weeks ago. They felt it was non-cardiac. Had a CXR on Dec 13 which was negative.  Abdominal pain - This started one week ago although on review of EMR she was admitted for this a couple weeks ago for probable gastroparesis. She states it feels like a "nausea pain". It is generalized. She reports associated vomiting up to 6 times a day. Emesis is liquid and green in color. She is constipated as well and typically has one Bm a month. She denies urinary symptoms. LMP was Dec 1. She is not sexually active. Had a CT scan of the abdomen  on Dec 13 which was negative. She is followed by GI.  HPI     Past Medical History:  Diagnosis Date  . Acanthosis nigricans   . Anxiety   . CHF (congestive heart failure) (HCC)   . Chronic lower back pain   . Depression   . DKA, type 1 (HCC) 09/13/2018  . Dyspepsia   . Obesity   . Ovarian cyst    pt is not aware of this hx (11/24/2017)  . 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 Premature baby   . Seizures (HCC)   . Type II diabetes mellitus (HCC)    insulin dependant    Patient Active Problem List   Diagnosis Date Noted  . Leukocytosis 12/19/2018  . Dehydration   . Pseudoseizures   . Uncontrolled diabetes mellitus (HCC) 11/28/2018  . Uncontrolled diabetes mellitus with hyperglycemia (HCC) 11/27/2018  . 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  . MDD (major depressive disorder), recurrent episode, severe (HCC) 11/06/2018  . Nonspecific abnormal electrocardiogram (ECG) (EKG)   . Chest pain of uncertain etiology   . Hypertensive urgency 10/08/2018  . Conversion disorder with attacks or seizures, acute episode, with psychological stressor 09/16/2018  . MDD (major depressive disorder), recurrent severe, without psychosis (HCC)   . AKI (acute kidney injury) (HCC) 08/04/2018  . Seizures (HCC) 08/03/2018  . Depression 07/25/2018  . Syncope 01/30/2018  . Orthostatic hypotension 01/24/2018  .  Tachycardia 12/28/2017  . Chronic abdominal pain 12/24/2017  . Chest pain 12/19/2017  . Nausea and vomiting 08/21/2017  . Generalized abdominal pain 08/21/2017  . Non compliance with medical treatment 01/27/2012  . Adjustment disorder 09/16/2011  . Acanthosis nigricans   . Goiter   . Obesity 06/14/2010  . Hypertension 06/14/2010    Past Surgical History:  Procedure Laterality Date  . ABDOMINAL HERNIA REPAIR     "I was a baby"  . BIOPSY  10/12/2018   Procedure: BIOPSY;  Surgeon: Jackquline Denmark, MD;  Location[facility] ENDOSCOPY;  Service: Endoscopy;;  .  ESOPHAGOGASTRODUODENOSCOPY (EGD) WITH PROPOFOL N/A 10/12/2018   Procedure: ESOPHAGOGASTRODUODENOSCOPY (EGD) WITH PROPOFOL;  Surgeon: Jackquline Denmark, MD;  Location: [facility] ENDOSCOPY;  Service: Endoscopy;  Laterality: N/A;  . HERNIA REPAIR    . LEFT HEART CATH AND CORONARY ANGIOGRAPHY N/A 10/13/2018   Procedure: LEFT HEART CATH AND CORONARY ANGIOGRAPHY;  Surgeon: Burnell Blanks, MD;  Location: [facility] CV LAB;  Service: Cardiovascular;  Laterality: N/A;  . TONSILLECTOMY AND ADENOIDECTOMY    . WISDOM TOOTH EXTRACTION  2017     OB History   No obstetric history on file.     Family History  Problem Relation Age of Onset  . Diabetes Mother   . Hypertension Mother   . Obesity Mother   . Asthma Mother   . Allergic rhinitis Mother   . Eczema Mother   . Cervical cancer Mother   . Diabetes Father   . Hypertension Father   . Obesity Father   . Hyperlipidemia Father   . Hypertension Paternal Aunt   . Hypertension Maternal Grandfather   . Colon cancer Maternal Grandfather   . Diabetes Paternal Grandmother   . Obesity Paternal Grandmother   . Diabetes Paternal Grandfather   . Obesity Paternal Grandfather   . Angioedema Neg Hx   . Immunodeficiency Neg Hx   . Urticaria Neg Hx   . Stomach cancer Neg Hx   . Esophageal cancer Neg Hx     Social History   Tobacco Use  . Smoking status: Never Smoker  . Smokeless tobacco: Never Used  Substance Use Topics  . Alcohol use: No    Alcohol/week: 0.0 standard drinks  . Drug use: No    Home Medications Prior to Admission medications   Medication Sig Start Date End Date Taking? Authorizing Provider  atorvastatin (LIPITOR) 20 MG tablet Take 1 tablet (20 mg total) by mouth daily at 6 PM. Patient taking differently: Take 10 mg by mouth 2 (two) times daily.  10/05/18   Elsie Stain, MD  bisacodyl (DULCOLAX) 10 MG suppository Place 1 suppository (10 mg total) rectally daily. 12/24/18   Swayze, Ava, DO  busPIRone (BUSPAR) 10 MG tablet  Take 10 mg by mouth daily.  11/19/18   [provider]  cycloSPORINE (RESTASIS) 0.05 % ophthalmic emulsion Place 1 drop into both eyes 2 (two) times daily.    [provider]  hydrOXYzine (ATARAX/VISTARIL) 50 MG tablet Take 1 tablet (50 mg total) by mouth 3 (three) times daily as needed for anxiety. Patient taking differently: Take 50 mg by mouth 3 (three) times daily.  11/11/18   Lindell Spar I, NP  insulin glargine (LANTUS) 100 UNIT/ML injection Inject 0.6 mLs (60 Units total) into the skin at bedtime. For diabetes management 11/11/18   Lindell Spar I, NP  insulin lispro (HUMALOG) 100 UNIT/ML injection Inject 0.15 mLs (15 Units total) into the skin 2 (two) times daily with a meal.  Patient taking differently: Inject 15 Units into the skin See admin instructions. Inject 15 units subcutaneously up to twice daily with meals 10/05/18   Storm Frisk, MD  lactulose (CHRONULAC) 10 GM/15ML solution Take 30 mLs (20 g total) by mouth 3 (three) times daily. 12/23/18   Swayze, Ava, DO  lamoTRIgine (LAMICTAL) 25 MG tablet Take 2 tablets (50 mg total) by mouth 2 (two) times daily. For mood stabilization 11/11/18   Armandina Stammer I, NP  lubiprostone (AMITIZA) 24 MCG capsule Take 1 capsule (24 mcg total) by mouth 2 (two) times daily with a meal. Patient taking differently: Take 24 mcg by mouth daily with breakfast.  10/27/18   Esterwood, Amy S, PA-C  meloxicam (MOBIC) 7.5 MG tablet Take 7.5 mg by mouth daily with lunch.     [provider]  metoCLOPramide (REGLAN) 10 MG tablet Take 1 tablet (10 mg total) by mouth 4 (four) times daily -  before meals and at bedtime. Patient taking differently: Take 10 mg by mouth daily before breakfast.  11/30/18 12/30/18  Jerald Kief, MD  mirtazapine (REMERON) 7.5 MG tablet Take 1 tablet (7.5 mg total) by mouth at bedtime. For depression/sleep 11/11/18   Armandina Stammer I, NP  Multiple Vitamin (MULTIVITAMIN WITH MINERALS) TABS tablet Take 1 tablet by mouth  daily. Patient not taking: Reported on 12/19/2018 09/18/18   Clapacs, Jackquline Denmark, MD  Olopatadine HCl (PAZEO) 0.7 % SOLN Place 2 drops into both eyes 2 (two) times daily.     [provider]  pantoprazole (PROTONIX) 40 MG tablet Take 1 tablet (40 mg total) by mouth 2 (two) times daily. For acid reflux Patient not taking: Reported on 12/19/2018 11/11/18   Armandina Stammer I, NP  polyethylene glycol (MIRALAX / GLYCOLAX) 17 g packet Take 17 g by mouth daily. 12/24/18   Swayze, Ava, DO  prochlorperazine (COMPAZINE) 25 MG suppository Place 1 suppository (25 mg total) rectally every 12 (twelve) hours as needed for nausea or vomiting. 12/05/18   Melene Plan, DO  promethazine (PHENERGAN) 25 MG suppository Place 25 mg rectally every 8 (eight) hours as needed for nausea or vomiting.    [provider]  propranolol (INDERAL) 20 MG tablet Take 1 tablet (20 mg total) by mouth 2 (two) times daily. For anxiety/HTN 12/23/18   Swayze, Ava, DO  QUEtiapine (SEROQUEL) 200 MG tablet Take 400 mg by mouth at bedtime.     [provider]  topiramate (TOPAMAX) 25 MG tablet Take 1 tablet (25 mg total) by mouth 2 (two) times daily. For seizure activities 11/11/18   Armandina Stammer I, NP    Allergies    Ibuprofen and Oatmeal  Review of Systems   Review of Systems  Constitutional: Positive for appetite change. Negative for chills and fever.  Respiratory: Positive for shortness of breath.   Cardiovascular: Positive for chest pain.  Gastrointestinal: Positive for abdominal pain, constipation, nausea and vomiting.  Genitourinary: Negative for dysuria, menstrual problem, pelvic pain, vaginal bleeding and vaginal discharge.  Neurological: Positive for seizures and headaches.  All other systems reviewed and are negative.   Physical Exam Updated Vital Signs BP (!) 116/91 (BP Location: Right Arm)   Pulse (!) 118   Temp 99 F (37.2 C) (Oral)   Resp (!) 23   LMP 12/07/2018   SpO2 100%   Physical  Exam Vitals and nursing note reviewed.  Constitutional:      General: She is not in acute distress.    Appearance: She is  well-developed. She is obese. She is not ill-appearing.  HENT:     Head: Normocephalic and atraumatic.  Eyes:     General: No scleral icterus.       Right eye: No discharge.        Left eye: No discharge.     Conjunctiva/sclera: Conjunctivae normal.     Pupils: Pupils are equal, round, and reactive to light.  Cardiovascular:     Rate and Rhythm: Tachycardia present.  Pulmonary:     Effort: Pulmonary effort is normal. No respiratory distress.  Abdominal:     General: Abdomen is protuberant. Bowel sounds are normal. There is no distension.     Tenderness: There is generalized abdominal tenderness.  Musculoskeletal:     Cervical back: Normal range of motion.  Skin:    General: Skin is warm and dry.  Neurological:     General: No focal deficit present.     Mental Status: She is alert and oriented to person, place, and time.  Psychiatric:        Mood and Affect: Mood normal.        Behavior: Behavior normal.     ED Results / Procedures / Treatments   Labs (all labs ordered are listed, but only abnormal results are displayed) Labs Reviewed  COMPREHENSIVE METABOLIC PANEL - Abnormal; Notable for the following components:      Result Value   Glucose, Bld 276 (*)    Creatinine, Ser 1.13 (*)    Total Protein 6.4 (*)    AST 14 (*)    All other components within normal limits  URINALYSIS, ROUTINE W REFLEX MICROSCOPIC - Abnormal; Notable for the following components:   APPearance HAZY (*)    Glucose, UA >=500 (*)    Ketones, ur 5 (*)    All other components within normal limits  LIPASE, BLOOD  CBC  I-STAT BETA HCG BLOOD, ED ([facility], [facility], [facility] ONLY)    EKG None  Radiology No results found.  Procedures Procedures (including critical care time)  Medications Ordered in ED Medications  sodium chloride 0.9 % bolus 1,000 mL (0 mLs Intravenous Stopped 12/30/18  2106)  HYDROmorphone (DILAUDID) injection 1 mg (1 mg Intramuscular Given 12/30/18 1706)  metoCLOPramide (REGLAN) tablet 10 mg (10 mg Oral Given 12/30/18 1705)  prochlorperazine (COMPAZINE) injection 10 mg (10 mg Intravenous Given 12/30/18 1923)  diphenhydrAMINE (BENADRYL) injection 25 mg (25 mg Intravenous Given 12/30/18 1925)  HYDROmorphone (DILAUDID) injection 1 mg (1 mg Intravenous Given 12/30/18 1922)    ED Course  I have reviewed the triage vital signs and the nursing notes.  Pertinent labs & imaging results that were available during my care of the patient were reviewed by me and considered in my medical decision making (see chart for details).  18 year old female with frequent ED visits and multiple complaints that are similar to prior. Her HR is elevated here but otherwise vitals are reassuring. She has generalized abdominal tenderness on exam. She has not had any seizure like activity here or vomiting. CBC is normal. CMP is remarkable for elevation of her SCr to 1.1 today. UA has 5 ketones. She has had 16 CT scans this year therefore will forego any more imaging tonight and focus on symptomatic control and rehydration.  7:52 PM Rechecked pt. It took some time to get IV started. She is just now getting fluids. She feels better after meds but HR is still elevated at ~125. Will continue to monitor. She is asking for  food stating she's very hungry - sandwich was given and she tolerated well.  9:22 PM Pt requesting to leave. HR is improved. Will d/c.  MDM Rules/Calculators/A&P                       Final Clinical Impression(s) / ED Diagnoses Final diagnoses:  Generalized abdominal pain  Nausea and vomiting, intractability of vomiting not specified, unspecified vomiting type    Rx / DC Orders ED Discharge Orders    None       Bethel BornGekas, Nickolai Rinks Marie, PA-C 12/30/18 2124    Charlynne PanderYao, David Hsienta, MD 12/31/18 1242

## 2019-01-02 ENCOUNTER — Encounter (HOSPITAL_COMMUNITY): Payer: Self-pay | Admitting: Emergency Medicine

## 2019-01-02 ENCOUNTER — Other Ambulatory Visit: Payer: Self-pay

## 2019-01-02 ENCOUNTER — Emergency Department (HOSPITAL_COMMUNITY)
Admission: EM | Admit: 2019-01-02 | Discharge: 2019-01-02 | Disposition: A | Discharge status: Home / Self Care | Payer: Medicaid Other | Attending: Emergency Medicine | Admitting: Emergency Medicine

## 2019-01-02 DIAGNOSIS — I110 Hypertensive heart disease with heart failure: Secondary | ICD-10-CM | POA: Insufficient documentation

## 2019-01-02 DIAGNOSIS — R112 Nausea with vomiting, unspecified: Secondary | ICD-10-CM | POA: Insufficient documentation

## 2019-01-02 DIAGNOSIS — I509 Heart failure, unspecified: Secondary | ICD-10-CM | POA: Insufficient documentation

## 2019-01-02 DIAGNOSIS — R569 Unspecified convulsions: Secondary | ICD-10-CM | POA: Diagnosis present

## 2019-01-02 DIAGNOSIS — Z79899 Other long term (current) drug therapy: Secondary | ICD-10-CM | POA: Diagnosis not present

## 2019-01-02 DIAGNOSIS — Z794 Long term (current) use of insulin: Secondary | ICD-10-CM | POA: Diagnosis not present

## 2019-01-02 DIAGNOSIS — E119 Type 2 diabetes mellitus without complications: Secondary | ICD-10-CM | POA: Diagnosis not present

## 2019-01-02 LAB — COMPREHENSIVE METABOLIC PANEL
ALT: 21 U/L (ref 0–44)
AST: 17 U/L (ref 15–41)
Albumin: 4.2 g/dL (ref 3.5–5.0)
Alkaline Phosphatase: 78 U/L (ref 38–126)
Anion gap: 12 (ref 5–15)
BUN: 10 mg/dL (ref 6–20)
CO2: 22 mmol/L (ref 22–32)
Calcium: 9.8 mg/dL (ref 8.9–10.3)
Chloride: 99 mmol/L (ref 98–111)
Creatinine, Ser: 0.61 mg/dL (ref 0.44–1.00)
GFR calc Af Amer: >60 mL/min (ref >60)
GFR calc non Af Amer: >60 mL/min (ref >60)
Glucose, Bld: 368 mg/dL — ABNORMAL HIGH (ref 70–99)
Potassium: 4.2 mmol/L (ref 3.5–5.1)
Sodium: 133 mmol/L — ABNORMAL LOW (ref 135–145)
Total Bilirubin: 0.4 mg/dL (ref 0.3–1.2)
Total Protein: 7 g/dL (ref 6.5–8.1)

## 2019-01-02 LAB — CBC
HCT: 37.9 % (ref 36.0–46.0)
Hemoglobin: 12.3 g/dL (ref 12.0–15.0)
MCH: 27.2 pg (ref 26.0–34.0)
MCHC: 32.5 g/dL (ref 30.0–36.0)
MCV: 83.8 fL (ref 80.0–100.0)
Platelets: 345 10*3/uL (ref 150–400)
RBC: 4.52 MIL/uL (ref 3.87–5.11)
RDW: 13.6 % (ref 11.5–15.5)
WBC: 7.8 10*3/uL (ref 4.0–10.5)
nRBC: 0 % (ref 0.0–0.2)

## 2019-01-02 LAB — I-STAT BETA HCG BLOOD, ED (MC, WL, AP ONLY): I-stat hCG, quantitative: <5 m[IU]/mL (ref <5)

## 2019-01-02 LAB — CBG MONITORING, ED
Glucose-Capillary: 357 mg/dL — ABNORMAL HIGH (ref 70–99)
Glucose-Capillary: 285 mg/dL — ABNORMAL HIGH (ref 70–99)

## 2019-01-02 LAB — LIPASE, BLOOD: Lipase: 27 U/L (ref 11–51)

## 2019-01-02 MED ORDER — SODIUM CHLORIDE 0.9% FLUSH: 3.0000 mL | Freq: Once | INTRAVENOUS | Status: DC

## 2019-01-02 MED ORDER — SODIUM CHLORIDE 0.9 % IV BOLUS
1000.0000 mL | Freq: Once | INTRAVENOUS | Status: AC
  Administered 2019-01-02: 1000 mL via INTRAVENOUS

## 2019-01-02 MED ORDER — PROMETHAZINE HCL 25 MG/ML IJ SOLN
25.0000 mg | Freq: Once | INTRAMUSCULAR | Status: AC
  Administered 2019-01-02: 25 mg via INTRAVENOUS
  Filled 2019-01-02: qty 1

## 2019-01-02 MED ORDER — MORPHINE SULFATE (PF) 4 MG/ML IV SOLN
4.0000 mg | Freq: Once | INTRAVENOUS | Status: AC
  Administered 2019-01-02: 4 mg via INTRAVENOUS
  Filled 2019-01-02: qty 1

## 2019-01-02 NOTE — ED Triage Notes (Signed)
Pt to triage via [facility] from home.  C/o vomiting since last night.  Bright red streaks in vomit this morning.  Reports 6 seizures this morning prior to EMS arrival.  No seizure activity for EMS.  CBG 465.  Pt reports sugar was 156 at 7am.  Generalized abd pain x 2 -3 weeks.

## 2019-01-02 NOTE — ED Notes (Addendum)
Pt verbalized understanding of discharge instructions, no further questions 

## 2019-01-02 NOTE — ED Provider Notes (Signed)
[facility] EMERGENCY DEPARTMENT Provider Note   CSN: [id number] Arrival date & time: 01/02/19  1275     History Chief Complaint  Patient presents with  . Abdominal Pain  . Seizures  . Emesis  . Hyperglycemia    Nancy [name] is a 18 y.o. female who presents with seizures and hematemesis. Pt has been seen multiple times for this and just 3 days ago by me for the same thing. She states she never felt better after leaving the hospital. She started having emesis today she estimates 6 times. It's black in color. She also had 6 seizures at home. She reports taking her medicines as prescribed. She has ongoing generalized abdominal pain. She has been taking aspirin for her symptoms. She denies fever, chills. She took her long acting insulin last night but hasn't taken short acting because she hasn't had anything to eat today.  HPI     Past Medical History:  Diagnosis Date  . Acanthosis nigricans   . Anxiety   . CHF (congestive heart failure) (HCC)   . Chronic lower back pain   . Depression   . DKA, type 1 (HCC) 09/13/2018  . Dyspepsia   . Obesity   . Ovarian cyst    pt is not aware of this hx (11/24/2017)  . 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 Premature baby   . Seizures (HCC)   . Type II diabetes mellitus (HCC)    insulin dependant    Patient Active Problem List   Diagnosis Date Noted  . Leukocytosis 12/19/2018  . Dehydration   . Pseudoseizures   . Uncontrolled diabetes mellitus (HCC) 11/28/2018  . Uncontrolled diabetes mellitus with hyperglycemia (HCC) 11/27/2018  . 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  . MDD (major depressive disorder), recurrent episode, severe (HCC) 11/06/2018  . Nonspecific abnormal electrocardiogram (ECG) (EKG)   . Chest pain of uncertain etiology   . Hypertensive urgency 10/08/2018  . Conversion disorder with attacks or seizures, acute episode, with psychological stressor 09/16/2018  .  MDD (major depressive disorder), recurrent severe, without psychosis (HCC)   . AKI (acute kidney injury) (HCC) 08/04/2018  . Seizures (HCC) 08/03/2018  . Depression 07/25/2018  . Syncope 01/30/2018  . Orthostatic hypotension 01/24/2018  . Tachycardia 12/28/2017  . Chronic abdominal pain 12/24/2017  . Chest pain 12/19/2017  . Nausea and vomiting 08/21/2017  . Generalized abdominal pain 08/21/2017  . Non compliance with medical treatment 01/27/2012  . Adjustment disorder 09/16/2011  . Acanthosis nigricans   . Goiter   . Obesity 06/14/2010  . Hypertension 06/14/2010    Past Surgical History:  Procedure Laterality Date  . ABDOMINAL HERNIA REPAIR     "I was a baby"  . BIOPSY  10/12/2018   Procedure: BIOPSY;  Surgeon: Lynann Bologna, MD;  Location: [facility] ENDOSCOPY;  Service: Endoscopy;;  . ESOPHAGOGASTRODUODENOSCOPY (EGD) WITH PROPOFOL N/A 10/12/2018   Procedure: ESOPHAGOGASTRODUODENOSCOPY (EGD) WITH PROPOFOL;  Surgeon: Lynann Bologna, MD;  Location: [facility] ENDOSCOPY;  Service: Endoscopy;  Laterality: N/A;  . HERNIA REPAIR    . LEFT HEART CATH AND CORONARY ANGIOGRAPHY N/A 10/13/2018   Procedure: LEFT HEART CATH AND CORONARY ANGIOGRAPHY;  Surgeon: Kathleene Hazel, MD;  Location: [facility] INVASIVE CV LAB;  Service: Cardiovascular;  Laterality: N/A;  . TONSILLECTOMY AND ADENOIDECTOMY    . WISDOM TOOTH EXTRACTION  2017     OB History   No obstetric history on file.     Family History  Problem Relation Age  of Onset  . Diabetes Mother   . Hypertension Mother   . Obesity Mother   . Asthma Mother   . Allergic rhinitis Mother   . Eczema Mother   . Cervical cancer Mother   . Diabetes Father   . Hypertension Father   . Obesity Father   . Hyperlipidemia Father   . Hypertension Paternal Aunt   . Hypertension Maternal Grandfather   . Colon cancer Maternal Grandfather   . Diabetes Paternal Grandmother   . Obesity Paternal Grandmother   . Diabetes Paternal Grandfather   . Obesity Paternal  Grandfather   . Angioedema Neg Hx   . Immunodeficiency Neg Hx   . Urticaria Neg Hx   . Stomach cancer Neg Hx   . Esophageal cancer Neg Hx     Social History   Tobacco Use  . Smoking status: Never Smoker  . Smokeless tobacco: Never Used  Substance Use Topics  . Alcohol use: No    Alcohol/week: 0.0 standard drinks  . Drug use: No    Home Medications Prior to Admission medications   Medication Sig Start Date End Date Taking? Authorizing Provider  atorvastatin (LIPITOR) 20 MG tablet Take 1 tablet (20 mg total) by mouth daily at 6 PM. Patient taking differently: Take 10 mg by mouth 2 (two) times daily.  10/05/18   Storm Frisk, MD  bisacodyl (DULCOLAX) 10 MG suppository Place 1 suppository (10 mg total) rectally daily. 12/24/18   Swayze, Ava, DO  busPIRone (BUSPAR) 10 MG tablet Take 10 mg by mouth daily.  11/19/18   [provider]  cycloSPORINE (RESTASIS) 0.05 % ophthalmic emulsion Place 1 drop into both eyes 2 (two) times daily.    [provider]  hydrOXYzine (ATARAX/VISTARIL) 50 MG tablet Take 1 tablet (50 mg total) by mouth 3 (three) times daily as needed for anxiety. Patient taking differently: Take 50 mg by mouth 3 (three) times daily.  11/11/18   Armandina Stammer I, NP  insulin glargine (LANTUS) 100 UNIT/ML injection Inject 0.6 mLs (60 Units total) into the skin at bedtime. For diabetes management 11/11/18   Armandina Stammer I, NP  insulin lispro (HUMALOG) 100 UNIT/ML injection Inject 0.15 mLs (15 Units total) into the skin 2 (two) times daily with a meal. Patient taking differently: Inject 15 Units into the skin See admin instructions. Inject 15 units subcutaneously up to twice daily with meals 10/05/18   Storm Frisk, MD  lactulose (CHRONULAC) 10 GM/15ML solution Take 30 mLs (20 g total) by mouth 3 (three) times daily. 12/23/18   Swayze, Ava, DO  lamoTRIgine (LAMICTAL) 25 MG tablet Take 2 tablets (50 mg total) by mouth 2 (two) times daily. For mood stabilization  11/11/18   Armandina Stammer I, NP  lubiprostone (AMITIZA) 24 MCG capsule Take 1 capsule (24 mcg total) by mouth 2 (two) times daily with a meal. Patient taking differently: Take 24 mcg by mouth daily with breakfast.  10/27/18   Esterwood, Amy S, PA-C  meloxicam (MOBIC) 7.5 MG tablet Take 7.5 mg by mouth daily with lunch.     [provider]  metoCLOPramide (REGLAN) 10 MG tablet Take 1 tablet (10 mg total) by mouth 4 (four) times daily -  before meals and at bedtime. Patient taking differently: Take 10 mg by mouth daily before breakfast.  11/30/18 12/30/18  Jerald Kief, MD  mirtazapine (REMERON) 7.5 MG tablet Take 1 tablet (7.5 mg total) by mouth at bedtime. For depression/sleep 11/11/18   Armandina Stammer  I, NP  Multiple Vitamin (MULTIVITAMIN WITH MINERALS) TABS tablet Take 1 tablet by mouth daily. Patient not taking: Reported on 12/19/2018 09/18/18   Clapacs, Madie Reno, MD  Olopatadine HCl (PAZEO) 0.7 % SOLN Place 2 drops into both eyes 2 (two) times daily.     [provider]  pantoprazole (PROTONIX) 40 MG tablet Take 1 tablet (40 mg total) by mouth 2 (two) times daily. For acid reflux Patient not taking: Reported on 12/19/2018 11/11/18   Lindell Spar I, NP  polyethylene glycol (MIRALAX / GLYCOLAX) 17 g packet Take 17 g by mouth daily. 12/24/18   Swayze, Ava, DO  prochlorperazine (COMPAZINE) 25 MG suppository Place 1 suppository (25 mg total) rectally every 12 (twelve) hours as needed for nausea or vomiting. 12/05/18   Deno Etienne, DO  promethazine (PHENERGAN) 25 MG suppository Place 25 mg rectally every 8 (eight) hours as needed for nausea or vomiting.    [provider]  propranolol (INDERAL) 20 MG tablet Take 1 tablet (20 mg total) by mouth 2 (two) times daily. For anxiety/HTN 12/23/18   Swayze, Ava, DO  QUEtiapine (SEROQUEL) 200 MG tablet Take 400 mg by mouth at bedtime.     [provider]  topiramate (TOPAMAX) 25 MG tablet Take 1 tablet (25 mg total) by mouth 2 (two)  times daily. For seizure activities 11/11/18   Lindell Spar I, NP    Allergies    Ibuprofen and Oatmeal  Review of Systems   Review of Systems  Constitutional: Negative for chills and fever.  Respiratory: Negative for shortness of breath.   Cardiovascular: Negative for chest pain.  Gastrointestinal: Positive for abdominal pain, constipation, nausea and vomiting.  Genitourinary: Negative for dysuria.  Neurological: Negative for headaches.  All other systems reviewed and are negative.   Physical Exam Updated Vital Signs BP (!) 135/91 (BP Location: Left Arm)   Pulse (!) 118   Temp 98.8 F (37.1 C) (Oral)   Resp 16   LMP 12/07/2018   SpO2 98%   Physical Exam Vitals and nursing note reviewed.  Constitutional:      General: She is not in acute distress.    Appearance: She is well-developed. She is obese. She is not ill-appearing.  HENT:     Head: Normocephalic and atraumatic.  Eyes:     General: No scleral icterus.       Right eye: No discharge.        Left eye: No discharge.     Conjunctiva/sclera: Conjunctivae normal.     Pupils: Pupils are equal, round, and reactive to light.  Cardiovascular:     Rate and Rhythm: Tachycardia present.  Pulmonary:     Effort: Pulmonary effort is normal. No respiratory distress.     Breath sounds: Normal breath sounds.  Abdominal:     General: There is no distension.     Palpations: Abdomen is soft.     Tenderness: There is abdominal tenderness (generalized).  Musculoskeletal:     Cervical back: Normal range of motion.  Skin:    General: Skin is warm and dry.  Neurological:     Mental Status: She is alert and oriented to person, place, and time.  Psychiatric:        Mood and Affect: Affect is flat.        Speech: Speech normal.        Behavior: Behavior normal. Behavior is cooperative.     ED Results / Procedures / Treatments   Labs (all labs  ordered are listed, but only abnormal results are displayed) Labs Reviewed    COMPREHENSIVE METABOLIC PANEL - Abnormal; Notable for the following components:      Result Value   Sodium 133 (*)    Glucose, Bld 368 (*)    All other components within normal limits  CBG MONITORING, ED - Abnormal; Notable for the following components:   Glucose-Capillary 357 (*)    All other components within normal limits  CBG MONITORING, ED - Abnormal; Notable for the following components:   Glucose-Capillary 285 (*)    All other components within normal limits  LIPASE, BLOOD  CBC  URINALYSIS, ROUTINE W REFLEX MICROSCOPIC  I-STAT BETA HCG BLOOD, ED ([facility], [facility], [facility] ONLY)    EKG None  Radiology No results found.  Procedures Procedures (including critical care time)  Medications Ordered in ED Medications  sodium chloride flush (NS) 0.9 % injection 3 mL (3 mLs Intravenous Not Given 01/02/19 1110)  sodium chloride 0.9 % bolus 1,000 mL (1,000 mLs Intravenous New Bag/Given 01/02/19 1245)  morphine 4 MG/ML injection 4 mg (4 mg Intravenous Given 01/02/19 1251)  promethazine (PHENERGAN) injection 25 mg (25 mg Intravenous Given 01/02/19 1245)    ED Course  I have reviewed the triage vital signs and the nursing notes.  Pertinent labs & imaging results that were available during my care of the patient were reviewed by me and considered in my medical decision making (see chart for details).  18 year old female presents with reported hematemesis and seizures at home.  Blood pressure is elevated she is tachycardic.  This is very similar to her presentation 3 days ago when I saw her.  Glucose is significantly elevated.  Otherwise lab work is normal.  Abdomen is soft and minimally tender.  Will give fluids and symptomatic control  Patient received a phone call from her mother stating that she was going to come get her.  She has not had any further vomiting or seizure-like activity here in the ED.  Will discharge  MDM Rules/Calculators/A&P                       Final Clinical  Impression(s) / ED Diagnoses Final diagnoses:  Non-intractable vomiting with nausea, unspecified vomiting type    Rx / DC Orders ED Discharge Orders    None       Bethel BornGekas, Amarissa Koerner Marie, PA-C 01/02/19 1457    Milagros Lollykstra, Richard S, MD 01/03/19 [phone]

## 2019-01-08 ENCOUNTER — Other Ambulatory Visit: Payer: Self-pay

## 2019-01-08 ENCOUNTER — Encounter (HOSPITAL_COMMUNITY): Payer: Self-pay

## 2019-01-08 DIAGNOSIS — I110 Hypertensive heart disease with heart failure: Secondary | ICD-10-CM | POA: Insufficient documentation

## 2019-01-08 DIAGNOSIS — R112 Nausea with vomiting, unspecified: Secondary | ICD-10-CM | POA: Insufficient documentation

## 2019-01-08 DIAGNOSIS — G40909 Epilepsy, unspecified, not intractable, without status epilepticus: Secondary | ICD-10-CM | POA: Insufficient documentation

## 2019-01-08 DIAGNOSIS — I509 Heart failure, unspecified: Secondary | ICD-10-CM | POA: Diagnosis not present

## 2019-01-08 DIAGNOSIS — Z79899 Other long term (current) drug therapy: Secondary | ICD-10-CM | POA: Insufficient documentation

## 2019-01-08 DIAGNOSIS — E119 Type 2 diabetes mellitus without complications: Secondary | ICD-10-CM | POA: Insufficient documentation

## 2019-01-08 DIAGNOSIS — Z791 Long term (current) use of non-steroidal anti-inflammatories (NSAID): Secondary | ICD-10-CM | POA: Insufficient documentation

## 2019-01-08 DIAGNOSIS — Z794 Long term (current) use of insulin: Secondary | ICD-10-CM | POA: Diagnosis not present

## 2019-01-08 NOTE — ED Triage Notes (Signed)
Pt called EMS reporting that she had an unwitnessed seizure. States that she woke up on the ground. No seizure activity with EMS and she reports medication compliance. No urinary incontinence or oral trauma. A&Ox4.

## 2019-01-09 ENCOUNTER — Other Ambulatory Visit: Payer: Self-pay

## 2019-01-09 ENCOUNTER — Emergency Department (HOSPITAL_COMMUNITY)
Admission: EM | Admit: 2019-01-09 | Discharge: 2019-01-09 | Disposition: A | Discharge status: Home / Self Care | Payer: Medicaid Other | Attending: Emergency Medicine | Admitting: Emergency Medicine

## 2019-01-09 DIAGNOSIS — R112 Nausea with vomiting, unspecified: Secondary | ICD-10-CM

## 2019-01-09 DIAGNOSIS — G40909 Epilepsy, unspecified, not intractable, without status epilepticus: Secondary | ICD-10-CM

## 2019-01-09 LAB — CBC WITH DIFFERENTIAL/PLATELET
Abs Immature Granulocytes: 0.03 10*3/uL (ref 0.00–0.07)
Basophils Absolute: 0.1 10*3/uL (ref 0.0–0.1)
Basophils Relative: 1 %
Eosinophils Absolute: 0.1 10*3/uL (ref 0.0–0.5)
Eosinophils Relative: 1 %
HCT: 43.9 % (ref 36.0–46.0)
Hemoglobin: 14.1 g/dL (ref 12.0–15.0)
Immature Granulocytes: 0 %
Lymphocytes Relative: 44 %
Lymphs Abs: 3.6 10*3/uL (ref 0.7–4.0)
MCH: 27.3 pg (ref 26.0–34.0)
MCHC: 32.1 g/dL (ref 30.0–36.0)
MCV: 85.1 fL (ref 80.0–100.0)
Monocytes Absolute: 0.7 10*3/uL (ref 0.1–1.0)
Monocytes Relative: 9 %
Neutro Abs: 3.7 10*3/uL (ref 1.7–7.7)
Neutrophils Relative %: 45 %
Platelets: 446 10*3/uL — ABNORMAL HIGH (ref 150–400)
RBC: 5.16 MIL/uL — ABNORMAL HIGH (ref 3.87–5.11)
RDW: 13.4 % (ref 11.5–15.5)
WBC: 8.2 10*3/uL (ref 4.0–10.5)
nRBC: 0 % (ref 0.0–0.2)

## 2019-01-09 LAB — I-STAT BETA HCG BLOOD, ED (MC, WL, AP ONLY): I-stat hCG, quantitative: <5 m[IU]/mL (ref <5)

## 2019-01-09 LAB — URINALYSIS, ROUTINE W REFLEX MICROSCOPIC
Bacteria, UA: NONE SEEN
Bilirubin Urine: NEGATIVE
Glucose, UA: >=500 mg/dL — AB
Hgb urine dipstick: NEGATIVE
Ketones, ur: 80 mg/dL — AB
Leukocytes,Ua: NEGATIVE
Nitrite: NEGATIVE
Protein, ur: NEGATIVE mg/dL
Specific Gravity, Urine: 1.035 — ABNORMAL HIGH (ref 1.005–1.030)
pH: 5 (ref 5.0–8.0)

## 2019-01-09 LAB — COMPREHENSIVE METABOLIC PANEL
ALT: 11 U/L (ref 0–44)
AST: 21 U/L (ref 15–41)
Albumin: 3.5 g/dL (ref 3.5–5.0)
Alkaline Phosphatase: 71 U/L (ref 38–126)
Anion gap: 10 (ref 5–15)
BUN: 13 mg/dL (ref 6–20)
CO2: 23 mmol/L (ref 22–32)
Calcium: 8.1 mg/dL — ABNORMAL LOW (ref 8.9–10.3)
Chloride: 107 mmol/L (ref 98–111)
Creatinine, Ser: 0.95 mg/dL (ref 0.44–1.00)
GFR calc Af Amer: >60 mL/min (ref >60)
GFR calc non Af Amer: >60 mL/min (ref >60)
Glucose, Bld: 256 mg/dL — ABNORMAL HIGH (ref 70–99)
Potassium: 4.1 mmol/L (ref 3.5–5.1)
Sodium: 140 mmol/L (ref 135–145)
Total Bilirubin: 0.7 mg/dL (ref 0.3–1.2)
Total Protein: 6.6 g/dL (ref 6.5–8.1)

## 2019-01-09 LAB — RAPID URINE DRUG SCREEN, HOSP PERFORMED
Amphetamines: NOT DETECTED
Barbiturates: NOT DETECTED
Benzodiazepines: NOT DETECTED
Cocaine: NOT DETECTED
Opiates: NOT DETECTED
Tetrahydrocannabinol: NOT DETECTED

## 2019-01-09 LAB — CBG MONITORING, ED: Glucose-Capillary: 269 mg/dL — ABNORMAL HIGH (ref 70–99)

## 2019-01-09 LAB — LIPASE, BLOOD: Lipase: 23 U/L (ref 11–51)

## 2019-01-09 IMAGING — CR DG CHEST 2V
2 series · 2 of 2 positions shown · non-contrast
Comparison: 12/23/2017

CLINICAL DATA: Chest pain, shortness of breath and vomiting.

EXAM:
CHEST - 2 VIEW

[chest pa]
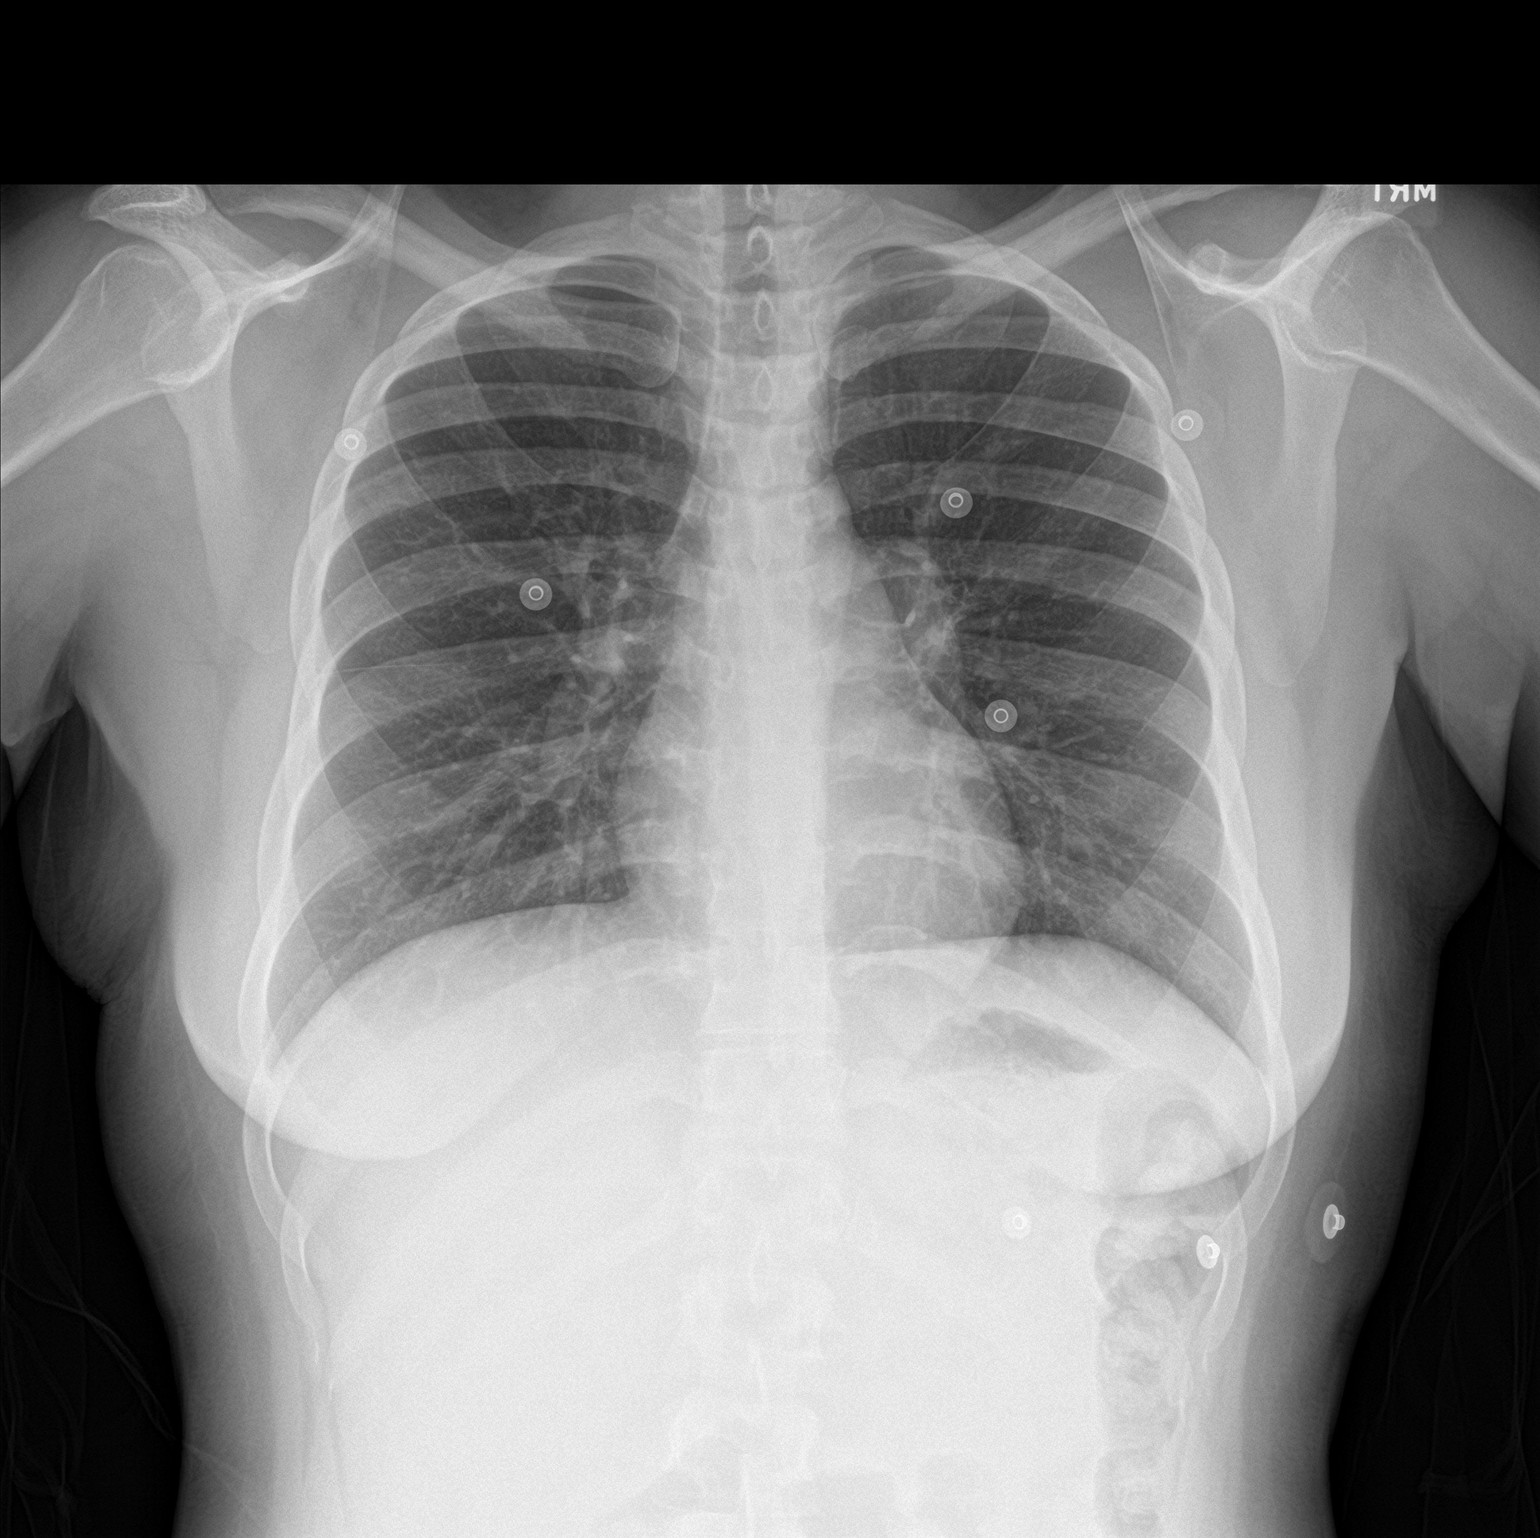

[chest lat]
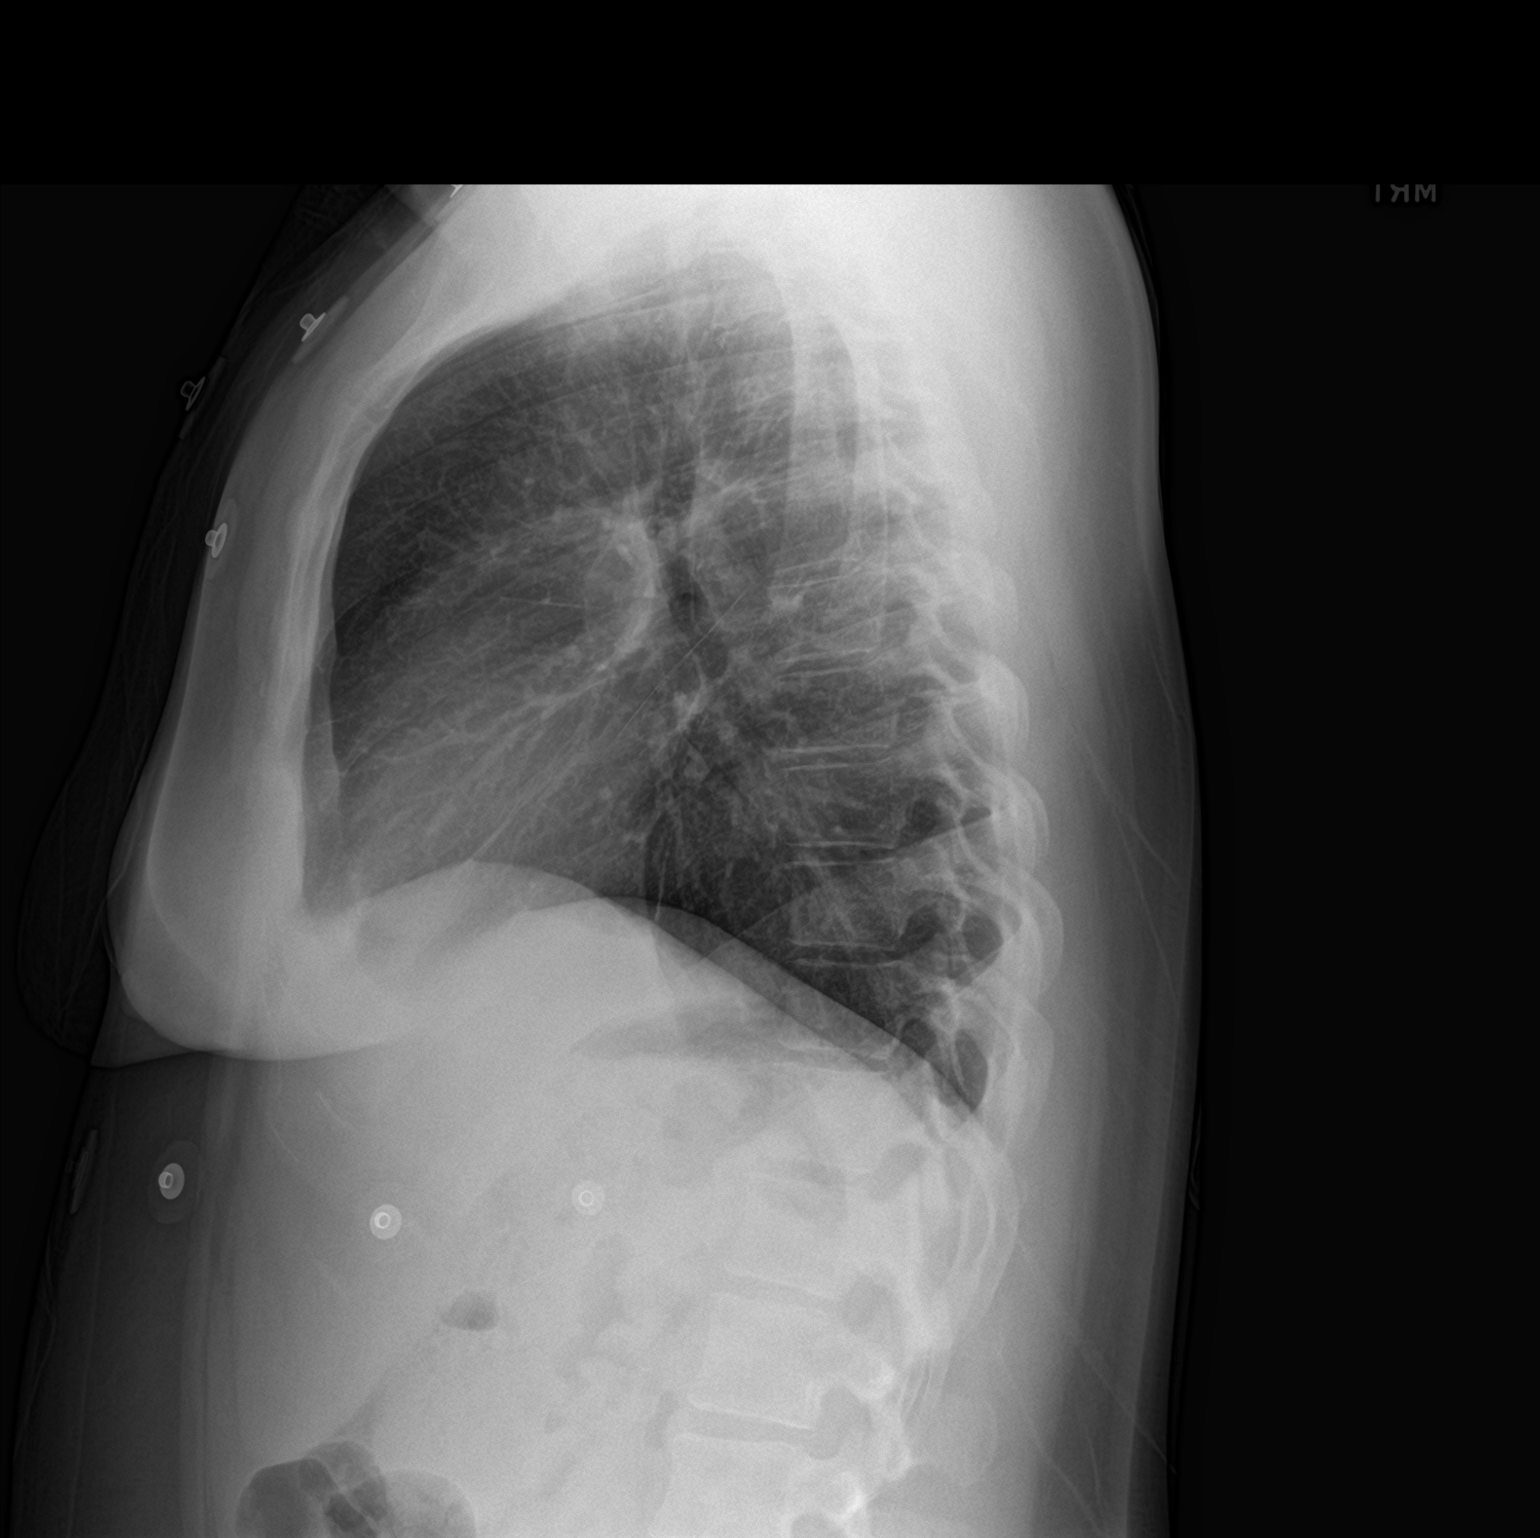

[2 of 2 positions shown; findings below may reference images not displayed]

FINDINGS: The heart size and mediastinal contours are within normal limits.
There is no evidence of pulmonary edema, consolidation,
pneumothorax, nodule or pleural fluid. The visualized skeletal
structures are unremarkable.
IMPRESSION: No active cardiopulmonary disease.

## 2019-01-09 MED ORDER — METOCLOPRAMIDE HCL 5 MG/ML IJ SOLN
10.0000 mg | Freq: Once | INTRAMUSCULAR | Status: AC
  Administered 2019-01-09: 10 mg via INTRAVENOUS
  Filled 2019-01-09: qty 2

## 2019-01-09 MED ORDER — SODIUM CHLORIDE 0.9 % IV BOLUS
1000.0000 mL | Freq: Once | INTRAVENOUS | Status: AC
  Administered 2019-01-09: 1000 mL via INTRAVENOUS

## 2019-01-09 MED ORDER — HALOPERIDOL LACTATE 5 MG/ML IJ SOLN
2.0000 mg | Freq: Once | INTRAMUSCULAR | Status: AC
  Administered 2019-01-09: 06:00:00 2 mg via INTRAVENOUS
  Filled 2019-01-09: qty 1

## 2019-01-09 MED ORDER — ONDANSETRON HCL 4 MG/2ML IJ SOLN: 4.0000 mg | Freq: Once | INTRAMUSCULAR | Status: DC

## 2019-01-09 NOTE — ED Notes (Signed)
Patient made aware that urine sample is needed.  

## 2019-01-09 NOTE — ED Provider Notes (Addendum)
[facility]-EMERGENCY DEPT Provider Note   CSN: [id number] Arrival date & time: 01/08/19  2221     History Chief Complaint  Patient presents with  . Seizures    Nancy [name] is a 19 y.o. female.  HPI     This is a 19 year old female with a history of type 1 diabetes, gastroparesis, seizures who presents with reported increase seizure activity, vomiting, and abdominal pain.  She has had several recent evaluations for the same.  Patient reports that she has had multiple seizures in the last day.  She states that they were witnessed by her family.  She reports compliance with her Lamictal and Keppra.  She reports multiple episodes of nonbilious, nonbloody emesis.  She reports diffuse abdominal pain stating "it is everywhere."  Denies any radiation of the pain.  Rates her pain at 9 out of 10.  She has not taken anything for her symptoms.  She denies fever, cough, shortness of breath.  Reports ongoing chest discomfort which is similar to her prior presentations.  Chart reviewed.  Extensive history of similar presentations including multiple admissions.  Most recent admission for persistent tachycardia, DKA, gastroparesis.  She recently has had a CT of the abdomen which was reassuring, MRI, EEG, echocardiogram.  Past Medical History:  Diagnosis Date  . Acanthosis nigricans   . Anxiety   . CHF (congestive heart failure) (HCC)   . Chronic lower back pain   . Depression   . DKA, type 1 (HCC) 09/13/2018  . Dyspepsia   . Obesity   . Ovarian cyst    pt is not aware of this hx (11/24/2017)  . 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 Premature baby   . Seizures (HCC)   . Type II diabetes mellitus (HCC)    insulin dependant    Patient Active Problem List   Diagnosis Date Noted  . Leukocytosis 12/19/2018  . Dehydration   . Pseudoseizures   . Uncontrolled diabetes mellitus (HCC) 11/28/2018  . Uncontrolled diabetes mellitus with hyperglycemia (HCC) 11/27/2018  . Severe  recurrent major depression without psychotic features (HCC) 11/08/2018  . MDD (major depressive disorder), recurrent episode, severe (HCC) 11/06/2018  . Nonspecific abnormal electrocardiogram (ECG) (EKG)   . Chest pain of uncertain etiology   . Hypertensive urgency 10/08/2018  . Conversion disorder with attacks or seizures, acute episode, with psychological stressor 09/16/2018  . MDD (major depressive disorder), recurrent severe, without psychosis (HCC)   . AKI (acute kidney injury) (HCC) 08/04/2018  . Seizures (HCC) 08/03/2018  . Depression 07/25/2018  . Syncope 01/30/2018  . Orthostatic hypotension 01/24/2018  . Tachycardia 12/28/2017  . Chronic abdominal pain 12/24/2017  . Chest pain 12/19/2017  . Nausea and vomiting 08/21/2017  . Generalized abdominal pain 08/21/2017  . Non compliance with medical treatment 01/27/2012  . Adjustment disorder 09/16/2011  . Acanthosis nigricans   . Goiter   . Obesity 06/14/2010  . Hypertension 06/14/2010    Past Surgical History:  Procedure Laterality Date  . ABDOMINAL HERNIA REPAIR     "I was a baby"  . BIOPSY  10/12/2018   Procedure: BIOPSY;  Surgeon: Lynann Bologna, MD;  Location: [facility] ENDOSCOPY;  Service: Endoscopy;;  . ESOPHAGOGASTRODUODENOSCOPY (EGD) WITH PROPOFOL N/A 10/12/2018   Procedure: ESOPHAGOGASTRODUODENOSCOPY (EGD) WITH PROPOFOL;  Surgeon: Lynann Bologna, MD;  Location: [facility] ENDOSCOPY;  Service: Endoscopy;  Laterality: N/A;  . HERNIA REPAIR    . LEFT HEART CATH AND CORONARY ANGIOGRAPHY N/A 10/13/2018   Procedure: LEFT HEART CATH AND  CORONARY ANGIOGRAPHY;  Surgeon: Burnell Blanks, MD;  Location: [place] [facility];  Service: Cardiovascular;  Laterality: N/A;  . TONSILLECTOMY AND ADENOIDECTOMY    . WISDOM TOOTH EXTRACTION  2017     OB History   No obstetric history on file.     Family History  Problem Relation Age of Onset  . Diabetes Mother   . Hypertension Mother   . Obesity Mother   . Asthma Mother   . Allergic  rhinitis Mother   . Eczema Mother   . Cervical cancer Mother   . Diabetes Father   . Hypertension Father   . Obesity Father   . Hyperlipidemia Father   . Hypertension Paternal Aunt   . Hypertension Maternal Grandfather   . Colon cancer Maternal Grandfather   . Diabetes Paternal Grandmother   . Obesity Paternal Grandmother   . Diabetes Paternal Grandfather   . Obesity Paternal Grandfather   . Angioedema Neg Hx   . Immunodeficiency Neg Hx   . Urticaria Neg Hx   . Stomach cancer Neg Hx   . Esophageal cancer Neg Hx     Social History   Tobacco Use  . Smoking status: Never Smoker  . Smokeless tobacco: Never Used  Substance Use Topics  . Alcohol use: No    Alcohol/week: 0.0 standard drinks  . Drug use: No    Home Medications Prior to Admission medications   Medication Sig Start Date End Date Taking? Authorizing Provider  atorvastatin (LIPITOR) 20 MG tablet Take 1 tablet (20 mg total) by mouth daily at 6 PM. Patient taking differently: Take 10 mg by mouth 2 (two) times daily.  10/05/18   Elsie Stain, MD  bisacodyl (DULCOLAX) 10 MG suppository Place 1 suppository (10 mg total) rectally daily. 12/24/18   Swayze, Ava, DO  busPIRone (BUSPAR) 10 MG tablet Take 10 mg by mouth daily.  11/19/18   [provider]  cycloSPORINE (RESTASIS) 0.05 % ophthalmic emulsion Place 1 drop into both eyes 2 (two) times daily.    [provider]  hydrOXYzine (ATARAX/VISTARIL) 50 MG tablet Take 1 tablet (50 mg total) by mouth 3 (three) times daily as needed for anxiety. Patient taking differently: Take 50 mg by mouth 3 (three) times daily.  11/11/18   Lindell Spar I, NP  insulin glargine (LANTUS) 100 UNIT/ML injection Inject 0.6 mLs (60 Units total) into the skin at bedtime. For diabetes management 11/11/18   Lindell Spar I, NP  insulin lispro (HUMALOG) 100 UNIT/ML injection Inject 0.15 mLs (15 Units total) into the skin 2 (two) times daily with a meal. Patient taking differently:  Inject 15 Units into the skin See admin instructions. Inject 15 units subcutaneously up to twice daily with meals 10/05/18   Elsie Stain, MD  lactulose (CHRONULAC) 10 GM/15ML solution Take 30 mLs (20 g total) by mouth 3 (three) times daily. 12/23/18   Swayze, Ava, DO  lamoTRIgine (LAMICTAL) 25 MG tablet Take 2 tablets (50 mg total) by mouth 2 (two) times daily. For mood stabilization 11/11/18   Lindell Spar I, NP  lubiprostone (AMITIZA) 24 MCG capsule Take 1 capsule (24 mcg total) by mouth 2 (two) times daily with a meal. Patient taking differently: Take 24 mcg by mouth daily with breakfast.  10/27/18   Esterwood, Amy S, PA-C  meloxicam (MOBIC) 7.5 MG tablet Take 7.5 mg by mouth daily with lunch.     [provider]  metoCLOPramide (REGLAN) 10 MG tablet Take 1 tablet (10  mg total) by mouth 4 (four) times daily -  before meals and at bedtime. Patient taking differently: Take 10 mg by mouth daily before breakfast.  11/30/18 12/30/18  Jerald Kief, MD  mirtazapine (REMERON) 7.5 MG tablet Take 1 tablet (7.5 mg total) by mouth at bedtime. For depression/sleep 11/11/18   Armandina Stammer I, NP  Multiple Vitamin (MULTIVITAMIN WITH MINERALS) TABS tablet Take 1 tablet by mouth daily. Patient not taking: Reported on 12/19/2018 09/18/18   Clapacs, Jackquline Denmark, MD  Olopatadine HCl (PAZEO) 0.7 % SOLN Place 2 drops into both eyes 2 (two) times daily.     [provider]  pantoprazole (PROTONIX) 40 MG tablet Take 1 tablet (40 mg total) by mouth 2 (two) times daily. For acid reflux Patient not taking: Reported on 12/19/2018 11/11/18   Armandina Stammer I, NP  polyethylene glycol (MIRALAX / GLYCOLAX) 17 g packet Take 17 g by mouth daily. 12/24/18   Swayze, Ava, DO  prochlorperazine (COMPAZINE) 25 MG suppository Place 1 suppository (25 mg total) rectally every 12 (twelve) hours as needed for nausea or vomiting. 12/05/18   Melene Plan, DO  promethazine (PHENERGAN) 25 MG suppository Place 25 mg rectally every 8  (eight) hours as needed for nausea or vomiting.    [provider]  propranolol (INDERAL) 20 MG tablet Take 1 tablet (20 mg total) by mouth 2 (two) times daily. For anxiety/HTN 12/23/18   Swayze, Ava, DO  QUEtiapine (SEROQUEL) 200 MG tablet Take 400 mg by mouth at bedtime.     [provider]  topiramate (TOPAMAX) 25 MG tablet Take 1 tablet (25 mg total) by mouth 2 (two) times daily. For seizure activities 11/11/18   Armandina Stammer I, NP    Allergies    Ibuprofen and Oatmeal  Review of Systems   Review of Systems  Constitutional: Negative for fever.  Respiratory: Negative for chest tightness and shortness of breath.   Cardiovascular: Negative for chest pain.  Gastrointestinal: Positive for abdominal pain, nausea and vomiting. Negative for diarrhea.  Genitourinary: Negative for dysuria.  Neurological: Positive for seizures. Negative for dizziness.  All other systems reviewed and are negative.   Physical Exam Updated Vital Signs BP 107/63   Pulse (!) 110   Temp 98.4 F (36.9 C) (Oral)   Resp 15   Ht 1.6 m (5\' 3" )   Wt 88.5 kg   SpO2 100%   BMI 34.54 kg/m   Physical Exam Vitals and nursing note reviewed.  Constitutional:      Appearance: She is well-developed. She is obese. She is not ill-appearing.  HENT:     Head: Normocephalic and atraumatic.     Nose: Nose normal.     Mouth/Throat:     Mouth: Mucous membranes are dry.  Eyes:     Extraocular Movements: Extraocular movements intact.     Pupils: Pupils are equal, round, and reactive to light.     Comments: Pupils 6 mm reactive bilaterally  Cardiovascular:     Rate and Rhythm: Regular rhythm. Tachycardia present.     Heart sounds: Normal heart sounds.  Pulmonary:     Effort: Pulmonary effort is normal. No respiratory distress.     Breath sounds: No wheezing.  Abdominal:     General: Bowel sounds are normal.     Palpations: Abdomen is soft.     Tenderness: There is abdominal tenderness. There is no  guarding or rebound.     Comments: epigastric tenderness palpation, no rebound or guarding  Musculoskeletal:     Cervical back: Neck supple.     Right lower leg: No edema.     Left lower leg: No edema.  Skin:    General: Skin is warm and dry.  Neurological:     Mental Status: She is alert and oriented to person, place, and time.     Comments: Fluent speech, cranial nerves II through XII intact, 5 out of 5 strength in all 4 extremities  Psychiatric:        Mood and Affect: Mood normal.     ED Results / Procedures / Treatments   Labs (all labs ordered are listed, but only abnormal results are displayed) Labs Reviewed  CBC WITH DIFFERENTIAL/PLATELET - Abnormal; Notable for the following components:      Result Value   RBC 5.16 (*)    Platelets 446 (*)    All other components within normal limits  URINALYSIS, ROUTINE W REFLEX MICROSCOPIC - Abnormal; Notable for the following components:   APPearance HAZY (*)    Specific Gravity, Urine 1.035 (*)    Glucose, UA >=500 (*)    Ketones, ur 80 (*)    All other components within normal limits  COMPREHENSIVE METABOLIC PANEL - Abnormal; Notable for the following components:   Glucose, Bld 256 (*)    Calcium 8.1 (*)    All other components within normal limits  CBG MONITORING, ED - Abnormal; Notable for the following components:   Glucose-Capillary 269 (*)    All other components within normal limits  RAPID URINE DRUG SCREEN, HOSP PERFORMED  LIPASE, BLOOD  I-STAT BETA HCG BLOOD, ED ([facility], [facility], [facility] ONLY)  I-STAT BETA HCG BLOOD, ED ([facility], [facility], [facility] ONLY)    EKG EKG Interpretation  Date/Time:  Sunday January 09 2019 07:37:33 EST Ventricular Rate:  115 PR Interval:    QRS Duration: 94 QT Interval:  334 QTC Calculation: 462 R Axis:   72 Text Interpretation: Sinus tachycardia Ventricular premature complex Borderline low voltage, extremity leads Baseline wander in lead(s) V3 V4 Confirmed by Margarita Grizzle [phone]) on 01/09/2019 7:44:46  AM   Radiology No results found.  Procedures Procedures (including critical care time)  Medications Ordered in ED Medications  sodium chloride 0.9 % bolus 1,000 mL (0 mLs Intravenous Stopped 01/09/19 0653)  haloperidol lactate (HALDOL) injection 2 mg (2 mg Intravenous Given 01/09/19 0547)  metoCLOPramide (REGLAN) injection 10 mg (10 mg Intravenous Given 01/09/19 0547)    ED Course  I have reviewed the triage vital signs and the nursing notes.  Pertinent labs & imaging results that were available during my care of the patient were reviewed by me and considered in my medical decision making (see chart for details).    MDM Rules/Calculators/A&P                      Patient with history of diabetes, gastroparesis, recurrent evaluations for DKA, nausea, vomiting, abdominal pain and chest pain.  She is overall nontoxic-appearing.  She does clinically appear dry.  Initially her heart rate is in the 130s.  Blood sugar at admission 269.  Given vital sign abnormalities, I will repeat her lab work.  I have low suspicion for intra-abdominal pathology.  Patient was given fluids and Haldol.  Lab work is still pending.  However, pulse rate has improved to 107.  She has not had any recurrent emesis or seizures while in the emergency room.  I anticipate if her lab work is reassuring she can be  discharged with outpatient follow-up.  7:55 AM Lab work reviewed.  No metabolic derangements.  No evidence of DKA.  Patient has had no recurrent emesis or seizures in the department.  Will discharge home with PCP follow-up.  Final Clinical Impression(s) / ED Diagnoses Final diagnoses:  Seizure disorder (HCC)  Non-intractable vomiting with nausea, unspecified vomiting type    Rx / DC Orders ED Discharge Orders    None       Dawsen Krieger, Mayer Masker, MD 01/09/19 3491    Shon Baton, MD 01/09/19 [phone]

## 2019-01-09 NOTE — Discharge Instructions (Signed)
Seen today for ongoing nausea and vomiting and seizures.  Your lab work-up is reassuring.  Follow-up closely with your primary doctor.  Take your Reglan at home as directed

## 2019-01-14 IMAGING — CT CT HEART MORP W/ CTA COR W/ SCORE W/ CA W/CM &/OR W/O CM
4 of 7 series · 8 of 20 positions shown, 9 images · IV contrast (APPLIED)
Comparison: 12/14/2017

Addendum:
EXAM:
OVER-READ INTERPRETATION  CT CHEST

The following report is an over-read performed by radiologist Dr.
Klpigbb Moolman [REDACTED] on 01/14/2018. This over-read
does not include interpretation of cardiac or coronary anatomy or
pathology. The coronary CTA interpretation by the cardiologist is
attached.
CLINICAL DATA: 17-year-old female with recurrent atypical chest
pain.
Cardiac/Coronary  CT
TECHNIQUE: The patient was scanned on a [employer] Force scanner.

[Series 7: best diast 73 % · axial · 0.39mm/px · z∈[+1142,+1182]mm · 2 of 298 slices shown, 3 images]
[im 100/298  vessel]
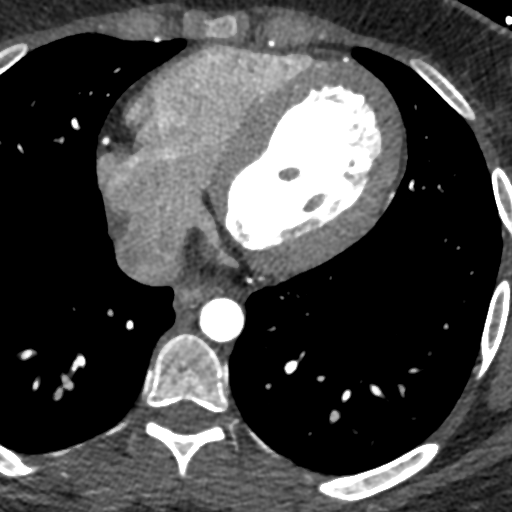
[im 100/298  lung]
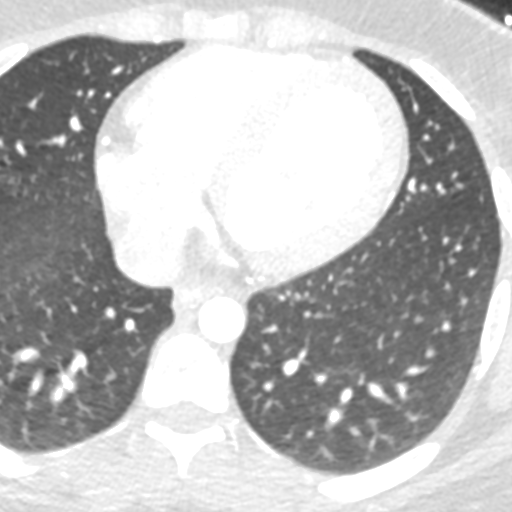
[im 199/298  vessel]
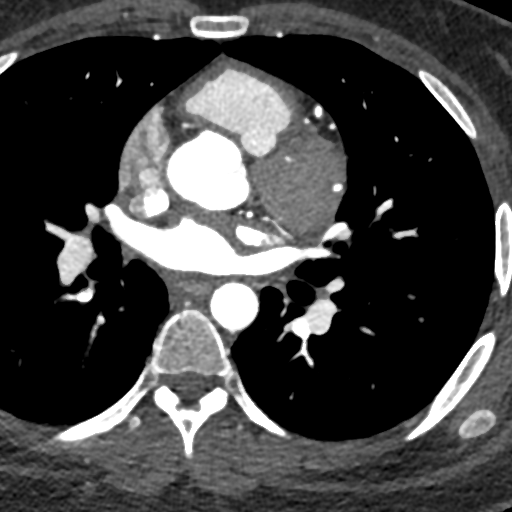

[Series 8: best syst 38 % · axial · 0.39mm/px · z∈[+1142,+1182]mm · 2 of 298 slices shown]
[im 100/298  vessel]
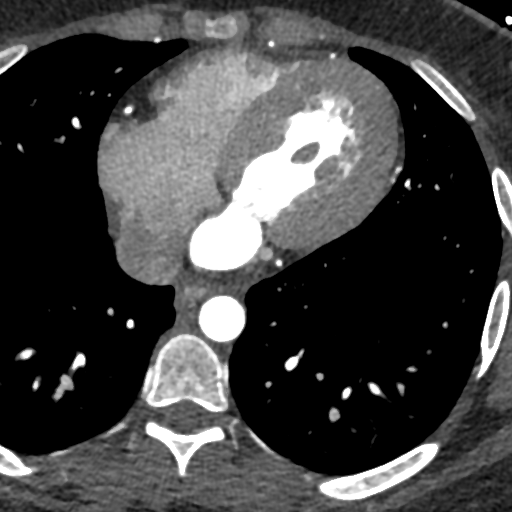
[im 199/298  vessel]
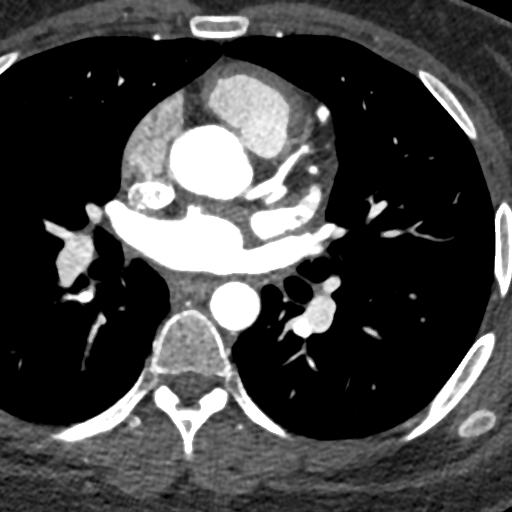

[Series 9: ts diast sharp 73 % · axial · 0.39mm/px · z∈[+1142,+1182]mm · 2 of 298 slices shown]
[im 100/298  lung]
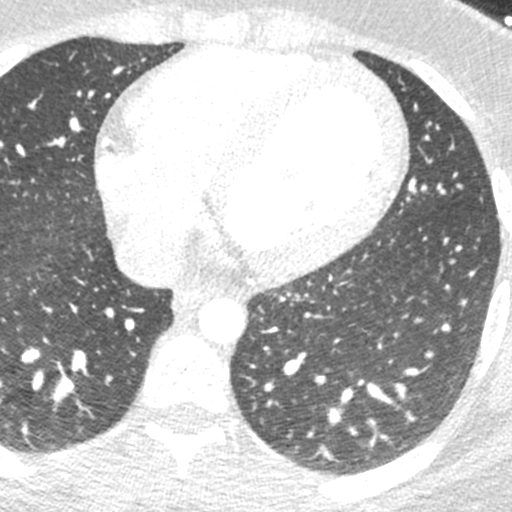
[im 199/298  lung]
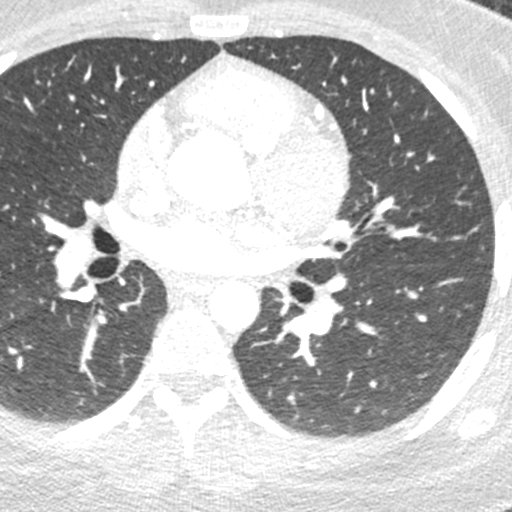

[Series 10: ts syst sharp 38 % · axial · 0.39mm/px · z∈[+1142,+1182]mm · 2 of 298 slices shown]
[im 100/298  lung]
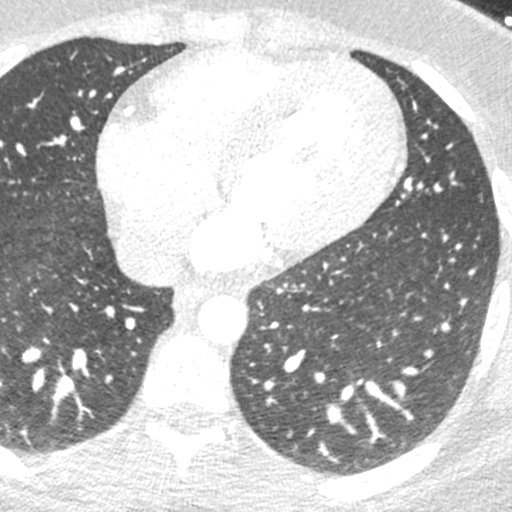
[im 199/298  lung]
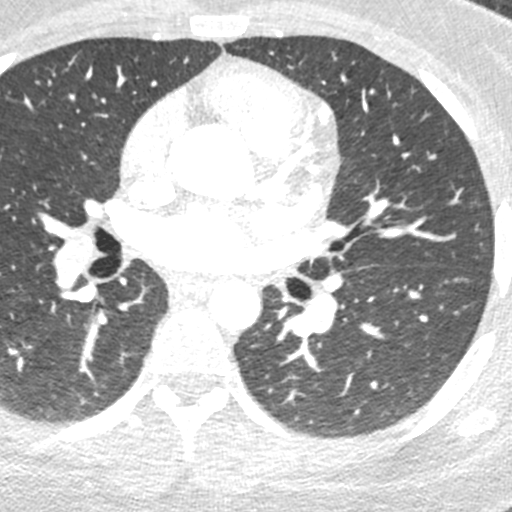

[8 of 20 positions shown; findings below may reference images not displayed]

FINDINGS: Vascular: Heart is normal size.  Visualized aorta normal caliber.

Mediastinum/Nodes: No adenopathy in the lower mediastinum or hila.

Lungs/Pleura: Visualized lungs clear.  No effusions.

Upper Abdomen: Imaging into the upper abdomen shows no acute
findings.

Musculoskeletal: Chest wall soft tissues are unremarkable. No acute
bony abnormality.
IMPRESSION: No acute or significant extracardiac abnormality.
FINDINGS: A 120 kV prospective scan was triggered in the descending thoracic
aorta at 111 HU's. Axial non-contrast 3 mm slices were carried out
through the heart. The data set was analyzed on a dedicated work
station and scored using the Agatson method. Gantry rotation speed
was 250 msecs and collimation was .6 mm. No beta blockade and 0.8 mg
of sl NTG was given. The 3D data set was reconstructed in 5%
intervals of the 67-82 % of the R-R cycle. Diastolic phases were
analyzed on a dedicated work station using MPR, MIP and VRT modes.
The patient received 80 cc of contrast.

Aorta:  Normal size.  No calcifications.  No dissection.

Aortic Valve:  Trileaflet.  No calcifications.

Coronary Arteries:  Normal coronary origin.  Right dominance.

RCA is a large dominant artery that gives rise to PDA and PLVB.
There is no plaque.

Left main is a very large artery that gives rise to LAD and LCX
arteries. Left main has no plaque.

LAD is a large vessel that gives rise to one small diagonal artery
and has no plaque.

LCX is a non-dominant artery that gives rise to two large OM
branches. There is no plaque.

Other findings:

Normal pulmonary vein drainage into the left atrium.

Normal let atrial appendage without a thrombus.

Normal size of the pulmonary artery.
IMPRESSION: 1. Coronary calcium score of 0. This was 0 percentile for age and
sex matched control.

2. Normal coronary origin with right dominance.

3. No evidence of CAD.  Consider non-cardiac causes of chest pain.

Gaku Fonseca Custodio

*** End of Addendum ***

## 2019-01-15 ENCOUNTER — Emergency Department (HOSPITAL_COMMUNITY)
Admission: EM | Admit: 2019-01-15 | Discharge: 2019-01-15 | Disposition: A | Discharge status: Home / Self Care | Payer: Medicaid Other | Attending: Emergency Medicine | Admitting: Emergency Medicine

## 2019-01-15 ENCOUNTER — Emergency Department (HOSPITAL_COMMUNITY): Payer: Medicaid Other

## 2019-01-15 ENCOUNTER — Other Ambulatory Visit: Payer: Self-pay

## 2019-01-15 ENCOUNTER — Encounter (HOSPITAL_COMMUNITY): Payer: Self-pay

## 2019-01-15 DIAGNOSIS — R569 Unspecified convulsions: Secondary | ICD-10-CM | POA: Insufficient documentation

## 2019-01-15 DIAGNOSIS — Z79899 Other long term (current) drug therapy: Secondary | ICD-10-CM | POA: Insufficient documentation

## 2019-01-15 DIAGNOSIS — I509 Heart failure, unspecified: Secondary | ICD-10-CM | POA: Insufficient documentation

## 2019-01-15 DIAGNOSIS — K2901 Acute gastritis with bleeding: Secondary | ICD-10-CM | POA: Insufficient documentation

## 2019-01-15 DIAGNOSIS — R112 Nausea with vomiting, unspecified: Secondary | ICD-10-CM

## 2019-01-15 DIAGNOSIS — E860 Dehydration: Secondary | ICD-10-CM | POA: Insufficient documentation

## 2019-01-15 DIAGNOSIS — I110 Hypertensive heart disease with heart failure: Secondary | ICD-10-CM | POA: Diagnosis not present

## 2019-01-15 DIAGNOSIS — Z794 Long term (current) use of insulin: Secondary | ICD-10-CM | POA: Diagnosis not present

## 2019-01-15 DIAGNOSIS — E119 Type 2 diabetes mellitus without complications: Secondary | ICD-10-CM | POA: Insufficient documentation

## 2019-01-15 LAB — BLOOD GAS, VENOUS
Acid-base deficit: 0.6 mmol/L (ref 0.0–2.0)
Bicarbonate: 25.7 mmol/L (ref 20.0–28.0)
FIO2: 100
O2 Saturation: 50.2 %
Patient temperature: 98.6
pCO2, Ven: 51.1 mmHg (ref 44.0–60.0)
pH, Ven: 7.322 (ref 7.250–7.430)
pO2, Ven: 31.4 mmHg — CL (ref 32.0–45.0)

## 2019-01-15 LAB — CBG MONITORING, ED: Glucose-Capillary: 381 mg/dL — ABNORMAL HIGH (ref 70–99)

## 2019-01-15 LAB — CBC WITH DIFFERENTIAL/PLATELET
Abs Immature Granulocytes: 0.05 10*3/uL (ref 0.00–0.07)
Basophils Absolute: 0.1 10*3/uL (ref 0.0–0.1)
Basophils Relative: 0 %
Eosinophils Absolute: 0 10*3/uL (ref 0.0–0.5)
Eosinophils Relative: 0 %
HCT: 48.6 % — ABNORMAL HIGH (ref 36.0–46.0)
Hemoglobin: 15 g/dL (ref 12.0–15.0)
Immature Granulocytes: 0 %
Lymphocytes Relative: 10 %
Lymphs Abs: 1.5 10*3/uL (ref 0.7–4.0)
MCH: 26.3 pg (ref 26.0–34.0)
MCHC: 30.9 g/dL (ref 30.0–36.0)
MCV: 85.1 fL (ref 80.0–100.0)
Monocytes Absolute: 0.8 10*3/uL (ref 0.1–1.0)
Monocytes Relative: 5 %
Neutro Abs: 12.7 10*3/uL — ABNORMAL HIGH (ref 1.7–7.7)
Neutrophils Relative %: 85 %
Platelets: 381 10*3/uL (ref 150–400)
RBC: 5.71 MIL/uL — ABNORMAL HIGH (ref 3.87–5.11)
RDW: 13.5 % (ref 11.5–15.5)
WBC: 15.1 10*3/uL — ABNORMAL HIGH (ref 4.0–10.5)
nRBC: 0 % (ref 0.0–0.2)

## 2019-01-15 LAB — COMPREHENSIVE METABOLIC PANEL
ALT: 18 U/L (ref 0–44)
AST: 14 U/L — ABNORMAL LOW (ref 15–41)
Albumin: 5.6 g/dL — ABNORMAL HIGH (ref 3.5–5.0)
Alkaline Phosphatase: 105 U/L (ref 38–126)
Anion gap: 18 — ABNORMAL HIGH (ref 5–15)
BUN: 12 mg/dL (ref 6–20)
CO2: 24 mmol/L (ref 22–32)
Calcium: 10.3 mg/dL (ref 8.9–10.3)
Chloride: 96 mmol/L — ABNORMAL LOW (ref 98–111)
Creatinine, Ser: 0.69 mg/dL (ref 0.44–1.00)
GFR calc Af Amer: >60 mL/min (ref >60)
GFR calc non Af Amer: >60 mL/min (ref >60)
Glucose, Bld: 388 mg/dL — ABNORMAL HIGH (ref 70–99)
Potassium: 3.9 mmol/L (ref 3.5–5.1)
Sodium: 138 mmol/L (ref 135–145)
Total Bilirubin: 1.1 mg/dL (ref 0.3–1.2)
Total Protein: 9.4 g/dL — ABNORMAL HIGH (ref 6.5–8.1)

## 2019-01-15 LAB — I-STAT BETA HCG BLOOD, ED (MC, WL, AP ONLY): I-stat hCG, quantitative: <5 m[IU]/mL (ref <5)

## 2019-01-15 LAB — LIPASE, BLOOD: Lipase: 20 U/L (ref 11–51)

## 2019-01-15 LAB — LACTIC ACID, PLASMA: Lactic Acid, Venous: 1.8 mmol/L (ref 0.5–1.9)

## 2019-01-15 MED ORDER — PANTOPRAZOLE SODIUM 40 MG IV SOLR
40.0000 mg | Freq: Once | INTRAVENOUS | Status: AC
  Administered 2019-01-15: 14:00:00 40 mg via INTRAVENOUS
  Filled 2019-01-15: qty 40

## 2019-01-15 MED ORDER — ALUM & MAG HYDROXIDE-SIMETH 200-200-20 MG/5ML PO SUSP
30.0000 mL | Freq: Once | ORAL | Status: AC
  Administered 2019-01-15: 30 mL via ORAL
  Filled 2019-01-15: qty 30

## 2019-01-15 MED ORDER — LIDOCAINE VISCOUS HCL 2 % MT SOLN
15.0000 mL | Freq: Once | OROMUCOSAL | Status: AC
  Administered 2019-01-15: 16:00:00 15 mL via ORAL
  Filled 2019-01-15: qty 15

## 2019-01-15 MED ORDER — MORPHINE SULFATE (PF) 4 MG/ML IV SOLN
4.0000 mg | Freq: Once | INTRAVENOUS | Status: AC
  Administered 2019-01-15: 14:00:00 4 mg via INTRAVENOUS
  Filled 2019-01-15: qty 1

## 2019-01-15 MED ORDER — LACTATED RINGERS IV BOLUS
1000.0000 mL | Freq: Once | INTRAVENOUS | Status: AC
  Administered 2019-01-15: 15:00:00 1000 mL via INTRAVENOUS

## 2019-01-15 MED ORDER — LACTATED RINGERS IV BOLUS: 1000.0000 mL | Freq: Once | INTRAVENOUS | Status: DC

## 2019-01-15 MED ORDER — LACTATED RINGERS IV BOLUS
1000.0000 mL | Freq: Once | INTRAVENOUS | Status: AC
  Administered 2019-01-15: 14:00:00 1000 mL via INTRAVENOUS

## 2019-01-15 MED ORDER — MORPHINE SULFATE (PF) 4 MG/ML IV SOLN: 4.0000 mg | Freq: Once | INTRAVENOUS | Status: DC

## 2019-01-15 MED ORDER — METOCLOPRAMIDE HCL 5 MG/ML IJ SOLN
10.0000 mg | Freq: Once | INTRAMUSCULAR | Status: AC
  Administered 2019-01-15: 14:00:00 10 mg via INTRAVENOUS
  Filled 2019-01-15: qty 2

## 2019-01-15 MED ORDER — MORPHINE SULFATE (PF) 4 MG/ML IV SOLN
4.0000 mg | Freq: Once | INTRAVENOUS | Status: AC
  Administered 2019-01-15: 16:00:00 4 mg via INTRAVENOUS
  Filled 2019-01-15: qty 1

## 2019-01-15 MED ORDER — LEVETIRACETAM IN NACL 1000 MG/100ML IV SOLN
1000.0000 mg | Freq: Once | INTRAVENOUS | Status: AC
  Administered 2019-01-15: 1000 mg via INTRAVENOUS
  Filled 2019-01-15: qty 100

## 2019-01-15 MED ORDER — HYOSCYAMINE SULFATE 0.125 MG SL SUBL
0.2500 mg | SUBLINGUAL_TABLET | Freq: Once | SUBLINGUAL | Status: AC
  Administered 2019-01-15: 16:00:00 0.25 mg via SUBLINGUAL
  Filled 2019-01-15: qty 2

## 2019-01-15 MED ORDER — QUETIAPINE FUMARATE 200 MG PO TABS: 400.0000 mg | ORAL_TABLET | Freq: Every day | ORAL | 0 refills | Status: DC

## 2019-01-15 NOTE — ED Triage Notes (Signed)
Patient arrived via [facility]. Patient is normally Aox4 and ambulatory. Patient chief complaint is as follows- patient had 8 seizures today with the last one having a fall which is why patient is in C-collar, patient did walk from kitchen to couch to lay down, patient has not been taking insulin due to feeling sick, patient also states she has been having emesis for 3x days with a scant amount of blood in one of them.   4mg  zofran given IV 22g in left AC.

## 2019-01-15 NOTE — ED Provider Notes (Signed)
[facility] DEPT Provider Note   CSN: [id number] Arrival date & time: 01/15/19  1229     History Chief Complaint  Patient presents with  . Seizures    Nancy [name] is a 19 y.o. female.  Patient is a 19 year old female with a history of type 2 diabetes, seizure versus pseudoseizure versus syncope, CHF, GERD, chronic constipation, chronic abdominal pain and possibly gastroparesis who is presenting today with multiple complaints.  Patient states that for the last several days she has had intermittent nausea vomiting and abdominal pain.  Yesterday she had 6-7 episodes of emesis with some blood present but she states last night she ate some oatmeal which she did not realize prior to eating and because she is allergic she tried taking some Benadryl but has been vomiting repeatedly ever since.  She reports a lot of blood in her emesis and has vomited more than 20 times in the last 24 hours.  She states today she had 6 or 7 seizures where her mom told her she was shaking all over.  She did have one episode of falling during a seizure where she hit her head and is now having a headache and neck pain.  She denies any numbness or tingling in her upper or lower extremities.  She is having diffuse upper abdominal pain which is worse if she tries to eat or with palpation.  She is also having pain radiating up into her chest and towards her left shoulder.  She denies fever but states for the last 6 days she has not been able to eat because she just feels hot and cold.  She does states she still taking her seizure medications which are Lamictal and Keppra and has been taking her Lantus at night.  She has not missed any of her Lantus doses but states she is not taking the Humalog because she has not been eating.  She denies productive cough or any shortness of breath or swelling.  She denies dysuria or vaginal discharge but has had polyuria and polydipsia.  LMP was 2 weeks ago.  The  history is provided by the patient.  Seizures      Past Medical History:  Diagnosis Date  . Acanthosis nigricans   . Anxiety   . CHF (congestive heart failure) ([place])   . Chronic lower back pain   . Depression   . DKA, type 1 ([place]) 09/13/2018  . Dyspepsia   . Obesity   . Ovarian cyst    pt is not aware of this hx (11/24/2017)  . 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 Premature baby   . Seizures ([place])   . Type II diabetes mellitus (HCC)    insulin dependant    Patient Active Problem List   Diagnosis Date Noted  . Leukocytosis 12/19/2018  . Dehydration   . Pseudoseizures   . Uncontrolled diabetes mellitus ([place]) 11/28/2018  . Uncontrolled diabetes mellitus with hyperglycemia ([place]) 11/27/2018  . Severe recurrent major depression without psychotic features ([place]) 11/08/2018  . MDD (major depressive disorder), recurrent episode, severe ([place]) 11/06/2018  . Nonspecific abnormal electrocardiogram (ECG) (EKG)   . Chest pain of uncertain etiology   . Hypertensive urgency 10/08/2018  . Conversion disorder with attacks or seizures, acute episode, with psychological stressor 09/16/2018  . MDD (major depressive disorder), recurrent severe, without psychosis ([place])   . AKI (acute kidney injury) ([place]) 08/04/2018  . Seizures ([place]) 08/03/2018  . Depression 07/25/2018  . Syncope 01/30/2018  .  Orthostatic hypotension 01/24/2018  . Tachycardia 12/28/2017  . Chronic abdominal pain 12/24/2017  . Chest pain 12/19/2017  . Nausea and vomiting 08/21/2017  . Generalized abdominal pain 08/21/2017  . Non compliance with medical treatment 01/27/2012  . Adjustment disorder 09/16/2011  . Acanthosis nigricans   . Goiter   . Obesity 06/14/2010  . Hypertension 06/14/2010    Past Surgical History:  Procedure Laterality Date  . ABDOMINAL HERNIA REPAIR     "I was a baby"  . BIOPSY  10/12/2018   Procedure: BIOPSY;  Surgeon: Lynann Bologna, MD;  Location: [facility] ENDOSCOPY;  Service: Endoscopy;;  .  ESOPHAGOGASTRODUODENOSCOPY (EGD) WITH PROPOFOL N/A 10/12/2018   Procedure: ESOPHAGOGASTRODUODENOSCOPY (EGD) WITH PROPOFOL;  Surgeon: Lynann Bologna, MD;  Location: [facility] ENDOSCOPY;  Service: Endoscopy;  Laterality: N/A;  . HERNIA REPAIR    . LEFT HEART CATH AND CORONARY ANGIOGRAPHY N/A 10/13/2018   Procedure: LEFT HEART CATH AND CORONARY ANGIOGRAPHY;  Surgeon: Kathleene Hazel, MD;  Location: [facility] INVASIVE CV LAB;  Service: Cardiovascular;  Laterality: N/A;  . TONSILLECTOMY AND ADENOIDECTOMY    . WISDOM TOOTH EXTRACTION  2017     OB History   No obstetric history on file.     Family History  Problem Relation Age of Onset  . Diabetes Mother   . Hypertension Mother   . Obesity Mother   . Asthma Mother   . Allergic rhinitis Mother   . Eczema Mother   . Cervical cancer Mother   . Diabetes Father   . Hypertension Father   . Obesity Father   . Hyperlipidemia Father   . Hypertension Paternal Aunt   . Hypertension Maternal Grandfather   . Colon cancer Maternal Grandfather   . Diabetes Paternal Grandmother   . Obesity Paternal Grandmother   . Diabetes Paternal Grandfather   . Obesity Paternal Grandfather   . Angioedema Neg Hx   . Immunodeficiency Neg Hx   . Urticaria Neg Hx   . Stomach cancer Neg Hx   . Esophageal cancer Neg Hx     Social History   Tobacco Use  . Smoking status: Never Smoker  . Smokeless tobacco: Never Used  Substance Use Topics  . Alcohol use: No    Alcohol/week: 0.0 standard drinks  . Drug use: No    Home Medications Prior to Admission medications   Medication Sig Start Date End Date Taking? Authorizing Provider  atorvastatin (LIPITOR) 20 MG tablet Take 1 tablet (20 mg total) by mouth daily at 6 PM. Patient taking differently: Take 10 mg by mouth 2 (two) times daily.  10/05/18   Storm Frisk, MD  bisacodyl (DULCOLAX) 10 MG suppository Place 1 suppository (10 mg total) rectally daily. 12/24/18   Swayze, Ava, DO  busPIRone (BUSPAR) 10 MG tablet  Take 10 mg by mouth daily.  11/19/18   [provider]  cycloSPORINE (RESTASIS) 0.05 % ophthalmic emulsion Place 1 drop into both eyes 2 (two) times daily.    [provider]  hydrOXYzine (ATARAX/VISTARIL) 50 MG tablet Take 1 tablet (50 mg total) by mouth 3 (three) times daily as needed for anxiety. Patient taking differently: Take 50 mg by mouth 3 (three) times daily.  11/11/18   Armandina Stammer I, NP  insulin glargine (LANTUS) 100 UNIT/ML injection Inject 0.6 mLs (60 Units total) into the skin at bedtime. For diabetes management 11/11/18   Armandina Stammer I, NP  insulin lispro (HUMALOG) 100 UNIT/ML injection Inject 0.15 mLs (15 Units total) into the skin 2 (two)  times daily with a meal. Patient taking differently: Inject 15 Units into the skin See admin instructions. Inject 15 units subcutaneously up to twice daily with meals 10/05/18   Storm Frisk, MD  lactulose (CHRONULAC) 10 GM/15ML solution Take 30 mLs (20 g total) by mouth 3 (three) times daily. 12/23/18   Swayze, Ava, DO  lamoTRIgine (LAMICTAL) 25 MG tablet Take 2 tablets (50 mg total) by mouth 2 (two) times daily. For mood stabilization 11/11/18   Armandina Stammer I, NP  lubiprostone (AMITIZA) 24 MCG capsule Take 1 capsule (24 mcg total) by mouth 2 (two) times daily with a meal. Patient taking differently: Take 24 mcg by mouth daily with breakfast.  10/27/18   Esterwood, Amy S, PA-C  meloxicam (MOBIC) 7.5 MG tablet Take 7.5 mg by mouth daily with lunch.     [provider]  metoCLOPramide (REGLAN) 10 MG tablet Take 1 tablet (10 mg total) by mouth 4 (four) times daily -  before meals and at bedtime. Patient taking differently: Take 10 mg by mouth daily before breakfast.  11/30/18 12/30/18  Jerald Kief, MD  mirtazapine (REMERON) 7.5 MG tablet Take 1 tablet (7.5 mg total) by mouth at bedtime. For depression/sleep 11/11/18   Armandina Stammer I, NP  Multiple Vitamin (MULTIVITAMIN WITH MINERALS) TABS tablet Take 1 tablet by mouth  daily. Patient not taking: Reported on 12/19/2018 09/18/18   Clapacs, Jackquline Denmark, MD  Olopatadine HCl (PAZEO) 0.7 % SOLN Place 2 drops into both eyes 2 (two) times daily.     [provider]  pantoprazole (PROTONIX) 40 MG tablet Take 1 tablet (40 mg total) by mouth 2 (two) times daily. For acid reflux Patient not taking: Reported on 12/19/2018 11/11/18   Armandina Stammer I, NP  polyethylene glycol (MIRALAX / GLYCOLAX) 17 g packet Take 17 g by mouth daily. 12/24/18   Swayze, Ava, DO  prochlorperazine (COMPAZINE) 25 MG suppository Place 1 suppository (25 mg total) rectally every 12 (twelve) hours as needed for nausea or vomiting. 12/05/18   Melene Plan, DO  promethazine (PHENERGAN) 25 MG suppository Place 25 mg rectally every 8 (eight) hours as needed for nausea or vomiting.    [provider]  propranolol (INDERAL) 20 MG tablet Take 1 tablet (20 mg total) by mouth 2 (two) times daily. For anxiety/HTN 12/23/18   Swayze, Ava, DO  QUEtiapine (SEROQUEL) 200 MG tablet Take 400 mg by mouth at bedtime.     [provider]  topiramate (TOPAMAX) 25 MG tablet Take 1 tablet (25 mg total) by mouth 2 (two) times daily. For seizure activities 11/11/18   Armandina Stammer I, NP    Allergies    Ibuprofen and Oatmeal  Review of Systems   Review of Systems  Neurological: Positive for seizures.  All other systems reviewed and are negative.   Physical Exam Updated Vital Signs BP (!) 125/91 (BP Location: Right Arm)   Pulse (!) 139   Temp 98.2 F (36.8 C) (Axillary)   Resp (!) 22   Ht  (1.6 m)   Wt 88.5 kg   LMP 01/08/2019   SpO2 100%   BMI 34.54 kg/m   Physical Exam Vitals and nursing note reviewed.  Constitutional:      General: She is not in acute distress.    Appearance: She is well-developed. She is obese.  HENT:     Head: Normocephalic and atraumatic.     Nose: Nose normal.     Mouth/Throat:  Mouth: Mucous membranes are moist.  Eyes:     Extraocular Movements:  Extraocular movements intact.     Pupils: Pupils are equal, round, and reactive to light.  Neck:     Comments: c-collar in place Cardiovascular:     Rate and Rhythm: Regular rhythm. Tachycardia present.     Pulses: Normal pulses.     Heart sounds: Normal heart sounds. No murmur. No friction rub.  Pulmonary:     Effort: Pulmonary effort is normal. Tachypnea present.     Breath sounds: Normal breath sounds. No wheezing or rales.  Abdominal:     General: Bowel sounds are normal. There is no distension.     Palpations: Abdomen is soft.     Tenderness: There is abdominal tenderness in the right upper quadrant, epigastric area and left upper quadrant. There is no guarding or rebound.  Musculoskeletal:        General: Normal range of motion.     Cervical back: Tenderness present. Spinous process tenderness and muscular tenderness present.     Right lower leg: No edema.     Left lower leg: No edema.     Comments: No edema  Skin:    General: Skin is warm and dry.     Capillary Refill: Capillary refill takes less than 2 seconds.     Findings: No rash.  Neurological:     General: No focal deficit present.     Mental Status: She is alert and oriented to person, place, and time. Mental status is at baseline.     Cranial Nerves: No cranial nerve deficit.  Psychiatric:        Mood and Affect: Mood normal.        Behavior: Behavior normal.        Thought Content: Thought content normal.     ED Results / Procedures / Treatments   Labs (all labs ordered are listed, but only abnormal results are displayed) Labs Reviewed  CBC WITH DIFFERENTIAL/PLATELET - Abnormal; Notable for the following components:      Result Value   WBC 15.1 (*)    RBC 5.71 (*)    HCT 48.6 (*)    Neutro Abs 12.7 (*)    All other components within normal limits  COMPREHENSIVE METABOLIC PANEL - Abnormal; Notable for the following components:   Chloride 96 (*)    Glucose, Bld 388 (*)    Total Protein 9.4 (*)     Albumin 5.6 (*)    AST 14 (*)    Anion gap 18 (*)    All other components within normal limits  BLOOD GAS, VENOUS - Abnormal; Notable for the following components:   pO2, Ven 31.4 (*)    All other components within normal limits  CBG MONITORING, ED - Abnormal; Notable for the following components:   Glucose-Capillary 381 (*)    All other components within normal limits  LIPASE, BLOOD  LACTIC ACID, PLASMA  URINALYSIS, ROUTINE W REFLEX MICROSCOPIC  CBG MONITORING, ED  I-STAT BETA HCG BLOOD, ED ([facility], [facility], [facility] ONLY)    EKG EKG Interpretation  Date/Time:  Saturday January 15 2019 12:47:13 EST Ventricular Rate:  138 PR Interval:    QRS Duration: 85 QT Interval:  314 QTC Calculation: 476 R Axis:   68 Text Interpretation: Sinus tachycardia Nonspecific T abnormalities, inferior leads ST elev, probable normal early repol pattern Borderline prolonged QT interval No significant change since last tracing Confirmed by Gwyneth Sprout ([id number]) on 01/15/2019 1:13:43 PM  Radiology CT Head Wo Contrast  Result Date: 01/15/2019 CLINICAL DATA:  Multiple seizures with fall.  Neck pain. EXAM: CT HEAD WITHOUT CONTRAST CT CERVICAL SPINE WITHOUT CONTRAST TECHNIQUE: Multidetector CT imaging of the head and cervical spine was performed following the standard protocol without intravenous contrast. Multiplanar CT image reconstructions of the cervical spine were also generated. COMPARISON:  Cervical spine CT October 30, 2018; head CT November 30, 2018; brain MRI December 01, 2018 FINDINGS: CT HEAD FINDINGS Brain: The ventricles and sulci appear stable and within normal limits. There is no intracranial mass, hemorrhage, extra-axial fluid collection or midline shift. The brain parenchyma appears unremarkable. No demonstrable acute infarct. Vascular: No hyperdense vessel. No vascular calcifications are evident. Skull: The bony calvarium appears intact. Sinuses/Orbits: There is slight mucosal thickening in several ethmoid  air cells. Other visualized paranasal sinuses are clear. Frontal sinuses are aplastic. Orbits appear symmetric bilaterally. Other: Mastoid air cells are clear. CT CERVICAL SPINE FINDINGS Alignment: There is no demonstrable spondylolisthesis. Skull base and vertebrae: Skull base and craniocervical junction regions appear normal. No evident fracture. No blastic or lytic lesions. Soft tissues and spinal canal: Prevertebral soft tissues and predental space regions are normal. There is no cord or canal hematoma. No paraspinous lesions are evident. Disc levels: Disc spaces appear normal. No appreciable facet arthropathy. No nerve root edema or effacement. No disc extrusion or stenosis. Upper chest: Visualized upper lung regions are clear. Other: None IMPRESSION: CT head: Slight mucosal thickening in several ethmoid air cells. Study otherwise unremarkable. CT cervical spine: No fracture or spondylolisthesis. No appreciable arthropathy. No nerve root edema or effacement. No disc extrusion or stenosis. Electronically Signed   By: Bretta Bang III M.D.   On: 01/15/2019 13:27   CT Cervical Spine Wo Contrast  Result Date: 01/15/2019 CLINICAL DATA:  Multiple seizures with fall.  Neck pain. EXAM: CT HEAD WITHOUT CONTRAST CT CERVICAL SPINE WITHOUT CONTRAST TECHNIQUE: Multidetector CT imaging of the head and cervical spine was performed following the standard protocol without intravenous contrast. Multiplanar CT image reconstructions of the cervical spine were also generated. COMPARISON:  Cervical spine CT October 30, 2018; head CT November 30, 2018; brain MRI December 01, 2018 FINDINGS: CT HEAD FINDINGS Brain: The ventricles and sulci appear stable and within normal limits. There is no intracranial mass, hemorrhage, extra-axial fluid collection or midline shift. The brain parenchyma appears unremarkable. No demonstrable acute infarct. Vascular: No hyperdense vessel. No vascular calcifications are evident. Skull: The bony  calvarium appears intact. Sinuses/Orbits: There is slight mucosal thickening in several ethmoid air cells. Other visualized paranasal sinuses are clear. Frontal sinuses are aplastic. Orbits appear symmetric bilaterally. Other: Mastoid air cells are clear. CT CERVICAL SPINE FINDINGS Alignment: There is no demonstrable spondylolisthesis. Skull base and vertebrae: Skull base and craniocervical junction regions appear normal. No evident fracture. No blastic or lytic lesions. Soft tissues and spinal canal: Prevertebral soft tissues and predental space regions are normal. There is no cord or canal hematoma. No paraspinous lesions are evident. Disc levels: Disc spaces appear normal. No appreciable facet arthropathy. No nerve root edema or effacement. No disc extrusion or stenosis. Upper chest: Visualized upper lung regions are clear. Other: None IMPRESSION: CT head: Slight mucosal thickening in several ethmoid air cells. Study otherwise unremarkable. CT cervical spine: No fracture or spondylolisthesis. No appreciable arthropathy. No nerve root edema or effacement. No disc extrusion or stenosis. Electronically Signed   By: Bretta Bang III M.D.   On: 01/15/2019 13:27   DG Chest  Port 1 View  Result Date: 01/15/2019 CLINICAL DATA:  Seizure with pain EXAM: PORTABLE CHEST 1 VIEW COMPARISON:  November 07, 2018 FINDINGS: Lungs are clear. Heart size and pulmonary vascularity are normal. No adenopathy. No pneumothorax. No bone lesions. IMPRESSION: No abnormality noted. Electronically Signed   By: Bretta Bang III M.D.   On: 01/15/2019 13:34    Procedures Procedures (including critical care time)  Medications Ordered in ED Medications  lactated ringers bolus 1,000 mL (has no administration in time range)  metoCLOPramide (REGLAN) injection 10 mg (has no administration in time range)  morphine 4 MG/ML injection 4 mg (has no administration in time range)  pantoprazole (PROTONIX) injection 40 mg (has no  administration in time range)  levETIRAcetam (KEPPRA) IVPB 1000 mg/100 mL premix (has no administration in time range)    ED Course  I have reviewed the triage vital signs and the nursing notes.  Pertinent labs & imaging results that were available during my care of the patient were reviewed by me and considered in my medical decision making (see chart for details).    MDM Rules/Calculators/A&P                      19 year old female with multiple medical problems and multiple prior emergency room evaluations and admissions for similar symptoms as today.  Patient has a history of chronic constipation and abdominal pain with intermittent episodes of vomiting.  Patient does not use marijuana and states she has had several evaluations for gastroparesis and told it was negative.  She does follow with Dr. Adela Lank and has spoken to the PA this week because she has had intermittent bouts of nausea and vomiting.  She has had some blood in her emesis and upper abdominal pain.  She does report that she does not use any NSAID products due to allergy but has recently started taking baby aspirin.  Low suspicion for GU pathology as she is having no dysuria, vaginal discharge or any lower abdominal pain.  Suspect that seizures today may have been brought on by her repetitive nausea and vomiting.  Patient is tachycardic and slightly tachypneic here.  Will rule out DKA.  Initial blood sugar in the 300s.  She has been using her nightly Lantus but has not been using her Humalog because she has not been eating.  She does report that she is still taking her seizure medication.  Patient has been on Protonix and still taking lactulose and Amitiza for her chronic constipation.  Patient's chest pain today seems noncardiac in nature.  Most likely related to GI.  It is a sharp pain in the center of her chest that radiates to the left.  She is not having shortness of breath and symptoms are not exertional.  Low suspicion for  dissection or PE today.  Concern for dehydration.  EKG is unchanged except for sinus tachycardia.  Patient given IV fluids.  Labs are pending.  Head and neck imaging pending due to her fall during a seizure today.  Patient was given Protonix, antiemetics and an IV dose of Keppra. 3:25 PM Patient's labs are significant for leukocytosis of 15,000 but otherwise normal CBC, beta hCG negative, CMP with glucose of 388 with normal potassium, LFTs and lipase.  Patient does have a mildly elevated anion gap of 18 with a normal lactic acid.  VBG was within normal limits and imaging of the chest, head and cervical spine are negative.  Patient's c-collar was removed.  On  repeat evaluation she states the nausea is much improved but she is still having chest and abdominal pain which she feels is worse than when she first got here.  Will give GI cocktail and levsin.  Pt is also requesting something to eat and drink.  5:04 PM Pt feeling better.  Tolerated po's and wanting to go home.  Requesting seroquel ppx due to her being out and that is why she is not sleeping.  Pt given short ppx.  Also tachycardia improved and feel pt stable for d/c.  Final Clinical Impression(s) / ED Diagnoses Final diagnoses:  Seizure-like activity (HCC)  Dehydration  Intractable vomiting with nausea, unspecified vomiting type  Acute gastritis with hemorrhage, unspecified gastritis type    Rx / DC Orders ED Discharge Orders         Ordered    QUEtiapine (SEROQUEL) 200 MG tablet  Daily at bedtime     01/15/19 1706           Gwyneth Sprout, MD 01/15/19 1959

## 2019-01-15 NOTE — ED Notes (Signed)
Pt verbalizes understanding of DC instructions. Pt belongings returned and is ambulatory out of ED.  

## 2019-01-15 NOTE — ED Notes (Signed)
ED Provider at bedside. 

## 2019-01-15 NOTE — Discharge Instructions (Signed)
Avoid any aspirin products.  Continue to take all your medication for your constipation as well as the Protonix for your stomach.  Continue to take your seizure medication and you were given a prescription for Seroquel today so hopefully will start getting more sleep.

## 2019-01-16 IMAGING — CR DG CHEST 2V
2 series · 2 of 2 positions shown · non-contrast
Comparison: 01/09/2018

CLINICAL DATA: Chest pain and shortness of breath. Two recent
syncopal episodes.

EXAM:
CHEST - 2 VIEW

[chest pa]
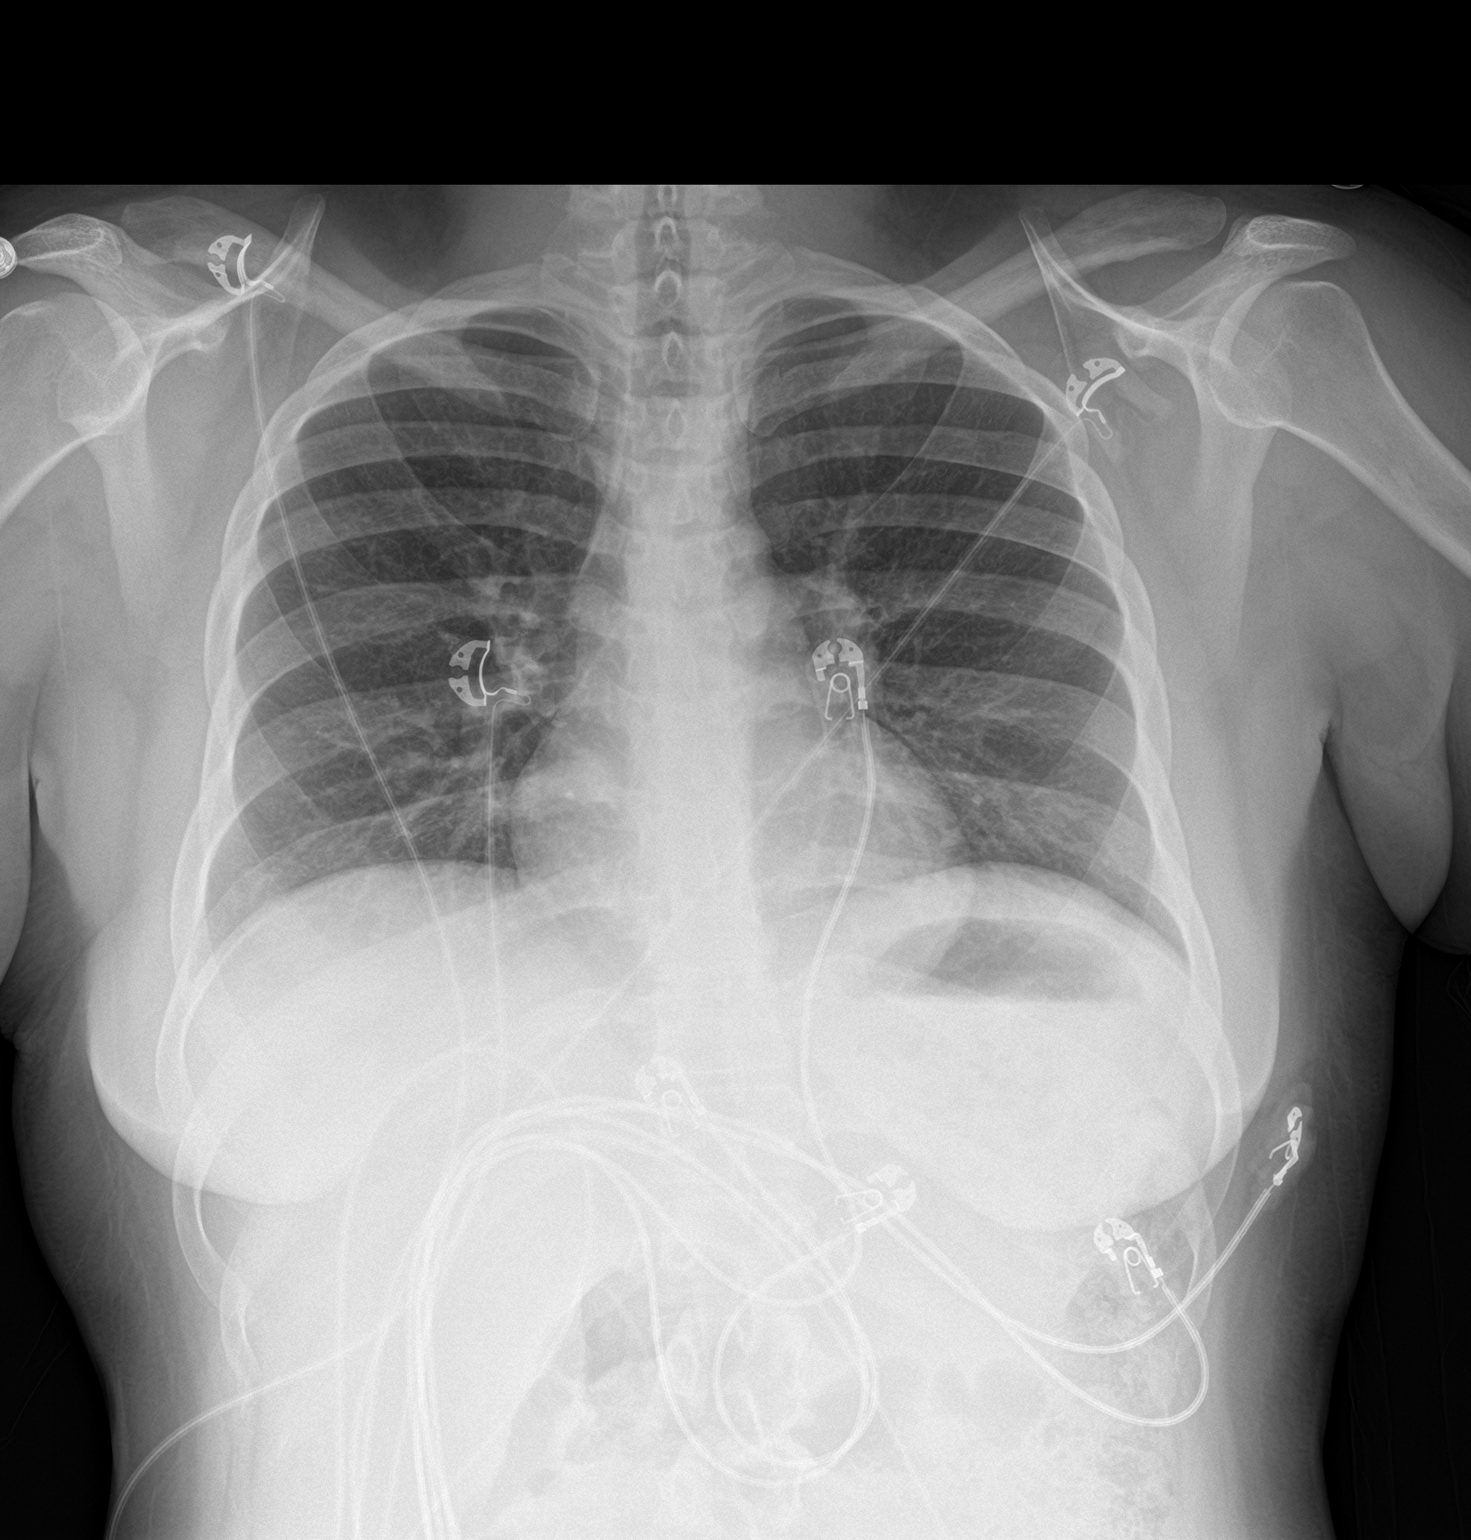

[chest lat]
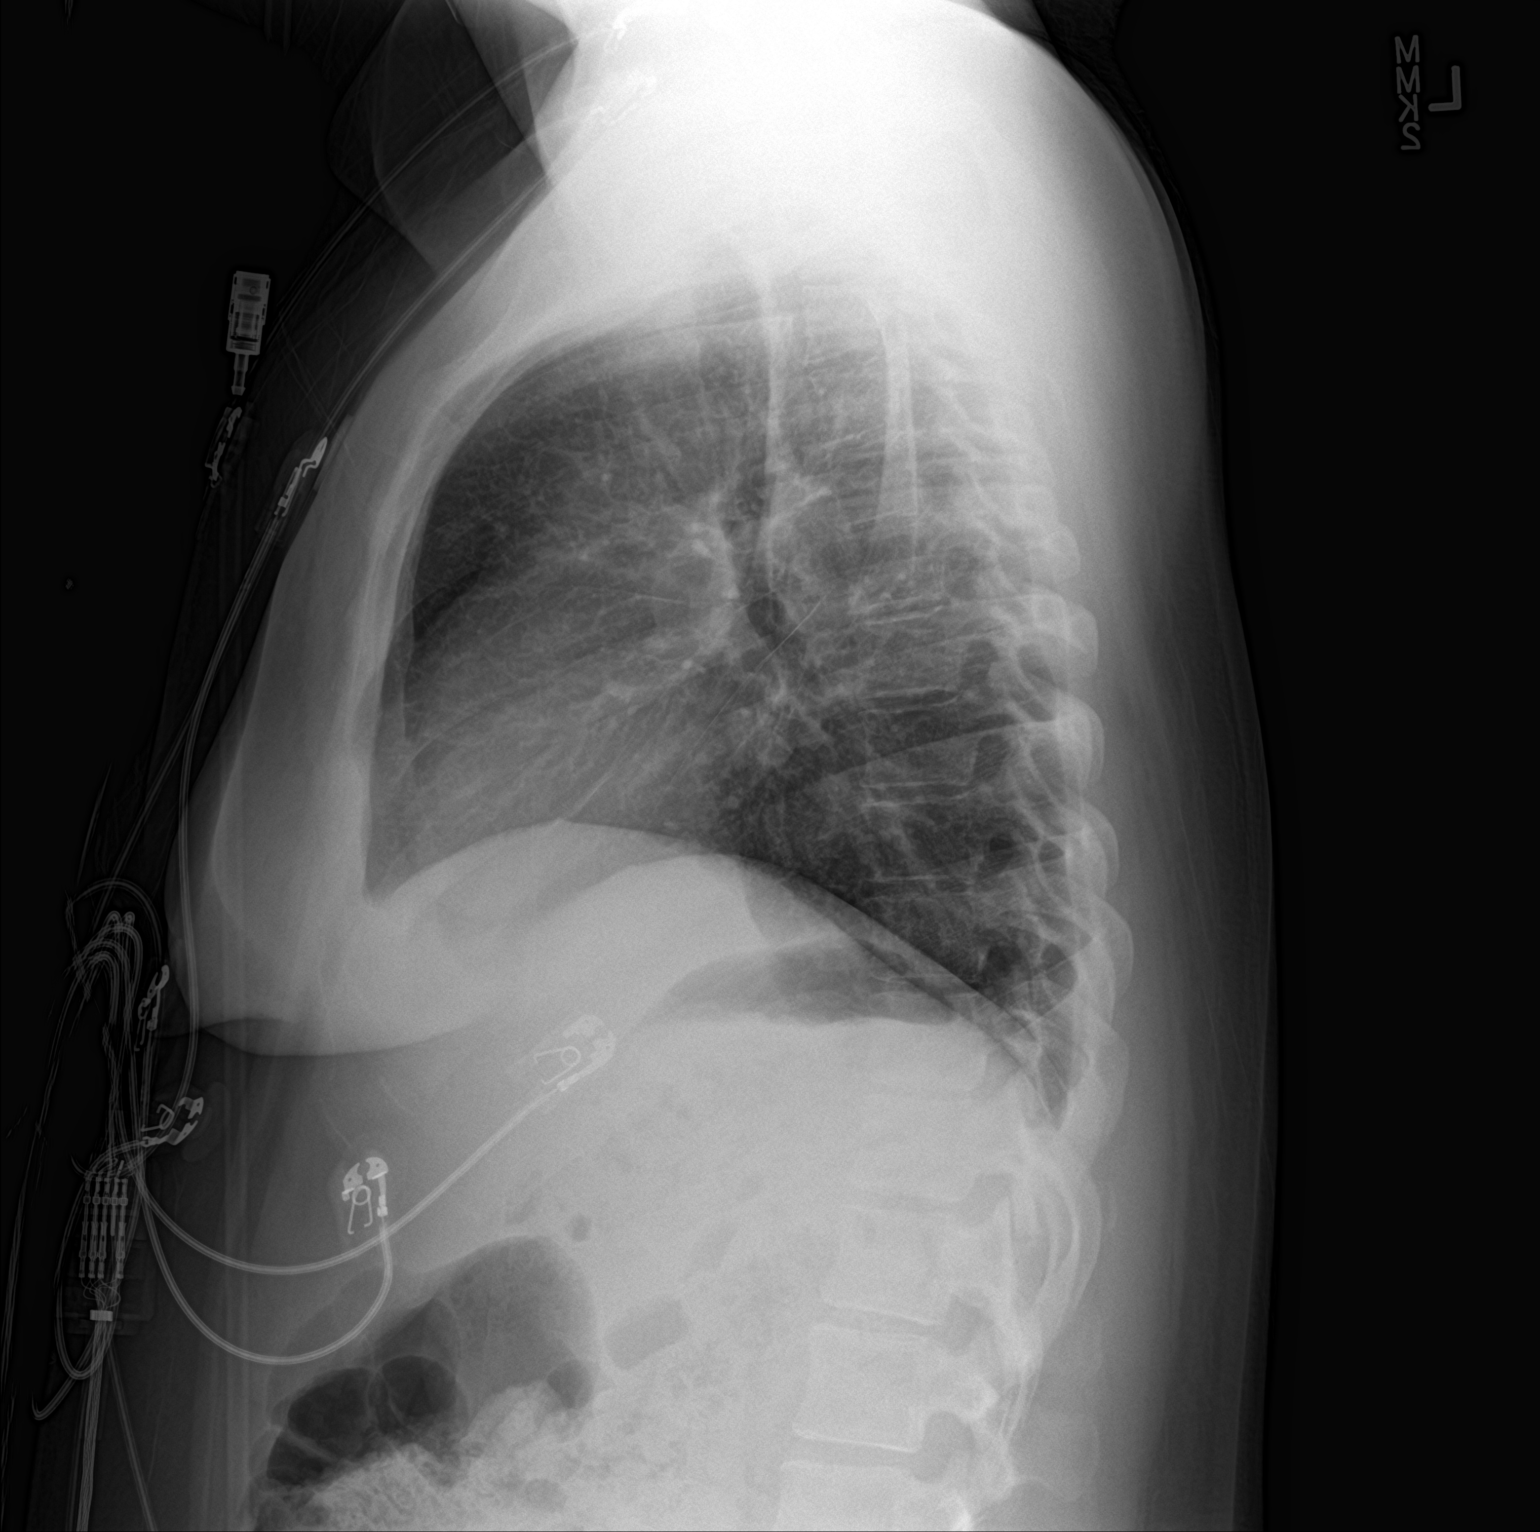

[2 of 2 positions shown; findings below may reference images not displayed]

FINDINGS: The heart size and mediastinal contours are within normal limits.
Both lungs are clear. The visualized skeletal structures are
unremarkable.
IMPRESSION: Negative.  No active cardiopulmonary disease.

## 2019-01-17 ENCOUNTER — Telehealth: Payer: Self-pay

## 2019-01-17 NOTE — Telephone Encounter (Signed)
Left message to please call back. Trying to schedule follow-up office visit after ED visit

## 2019-01-19 ENCOUNTER — Other Ambulatory Visit: Payer: Self-pay

## 2019-01-19 ENCOUNTER — Telehealth: Payer: Self-pay

## 2019-01-19 ENCOUNTER — Emergency Department (HOSPITAL_COMMUNITY)
Admission: EM | Admit: 2019-01-19 | Discharge: 2019-01-19 | Disposition: A | Discharge status: Home / Self Care | Payer: Medicaid Other | Attending: Emergency Medicine | Admitting: Emergency Medicine

## 2019-01-19 ENCOUNTER — Encounter (HOSPITAL_COMMUNITY): Payer: Self-pay | Admitting: Emergency Medicine

## 2019-01-19 DIAGNOSIS — R112 Nausea with vomiting, unspecified: Secondary | ICD-10-CM | POA: Diagnosis present

## 2019-01-19 DIAGNOSIS — I110 Hypertensive heart disease with heart failure: Secondary | ICD-10-CM | POA: Diagnosis not present

## 2019-01-19 DIAGNOSIS — R1084 Generalized abdominal pain: Secondary | ICD-10-CM | POA: Diagnosis not present

## 2019-01-19 DIAGNOSIS — Z79899 Other long term (current) drug therapy: Secondary | ICD-10-CM | POA: Insufficient documentation

## 2019-01-19 DIAGNOSIS — R202 Paresthesia of skin: Secondary | ICD-10-CM | POA: Insufficient documentation

## 2019-01-19 DIAGNOSIS — Z794 Long term (current) use of insulin: Secondary | ICD-10-CM | POA: Insufficient documentation

## 2019-01-19 DIAGNOSIS — I509 Heart failure, unspecified: Secondary | ICD-10-CM | POA: Diagnosis not present

## 2019-01-19 DIAGNOSIS — E1165 Type 2 diabetes mellitus with hyperglycemia: Secondary | ICD-10-CM | POA: Diagnosis not present

## 2019-01-19 LAB — COMPREHENSIVE METABOLIC PANEL
ALT: 15 U/L (ref 0–44)
AST: 15 U/L (ref 15–41)
Albumin: 3.9 g/dL (ref 3.5–5.0)
Alkaline Phosphatase: 86 U/L (ref 38–126)
Anion gap: 15 (ref 5–15)
BUN: 8 mg/dL (ref 6–20)
CO2: 21 mmol/L — ABNORMAL LOW (ref 22–32)
Calcium: 9 mg/dL (ref 8.9–10.3)
Chloride: 103 mmol/L (ref 98–111)
Creatinine, Ser: 0.57 mg/dL (ref 0.44–1.00)
GFR calc Af Amer: >60 mL/min (ref >60)
GFR calc non Af Amer: >60 mL/min (ref >60)
Glucose, Bld: 331 mg/dL — ABNORMAL HIGH (ref 70–99)
Potassium: 3.9 mmol/L (ref 3.5–5.1)
Sodium: 139 mmol/L (ref 135–145)
Total Bilirubin: 0.5 mg/dL (ref 0.3–1.2)
Total Protein: 6.8 g/dL (ref 6.5–8.1)

## 2019-01-19 LAB — CBC
HCT: 42.2 % (ref 36.0–46.0)
Hemoglobin: 13.4 g/dL (ref 12.0–15.0)
MCH: 26.9 pg (ref 26.0–34.0)
MCHC: 31.8 g/dL (ref 30.0–36.0)
MCV: 84.6 fL (ref 80.0–100.0)
Platelets: 317 10*3/uL (ref 150–400)
RBC: 4.99 MIL/uL (ref 3.87–5.11)
RDW: 13.1 % (ref 11.5–15.5)
WBC: 8.6 10*3/uL (ref 4.0–10.5)
nRBC: 0 % (ref 0.0–0.2)

## 2019-01-19 LAB — I-STAT BETA HCG BLOOD, ED (MC, WL, AP ONLY): I-stat hCG, quantitative: <5 m[IU]/mL (ref <5)

## 2019-01-19 LAB — LIPASE, BLOOD: Lipase: 19 U/L (ref 11–51)

## 2019-01-19 MED ORDER — HYDROMORPHONE HCL 1 MG/ML IJ SOLN
1.0000 mg | Freq: Once | INTRAMUSCULAR | Status: AC
  Administered 2019-01-19: 20:00:00 1 mg via INTRAVENOUS
  Filled 2019-01-19: qty 1

## 2019-01-19 MED ORDER — SODIUM CHLORIDE 0.9 % IV BOLUS
1000.0000 mL | Freq: Once | INTRAVENOUS | Status: AC
  Administered 2019-01-19: 17:00:00 1000 mL via INTRAVENOUS

## 2019-01-19 MED ORDER — MORPHINE SULFATE (PF) 4 MG/ML IV SOLN
4.0000 mg | Freq: Once | INTRAVENOUS | Status: AC
  Administered 2019-01-19: 17:00:00 4 mg via INTRAVENOUS
  Filled 2019-01-19: qty 1

## 2019-01-19 MED ORDER — METOCLOPRAMIDE HCL 5 MG/ML IJ SOLN
10.0000 mg | Freq: Once | INTRAMUSCULAR | Status: AC
  Administered 2019-01-19: 17:00:00 10 mg via INTRAVENOUS
  Filled 2019-01-19: qty 2

## 2019-01-19 MED ORDER — PANTOPRAZOLE SODIUM 40 MG IV SOLR
40.0000 mg | Freq: Once | INTRAVENOUS | Status: AC
  Administered 2019-01-19: 18:00:00 40 mg via INTRAVENOUS
  Filled 2019-01-19: qty 40

## 2019-01-19 NOTE — ED Notes (Signed)
Pt is aware of need for urine sample, states she cannot go at this time.

## 2019-01-19 NOTE — Discharge Instructions (Addendum)
Please follow-up closely with your GI doctor regarding your visit today. Continue taking your prescribed medications as needed. Return to the emergency department for severely worsening abdominal pain, high fever, uncontrollable vomiting.

## 2019-01-19 NOTE — Telephone Encounter (Signed)
Spoke to Patient's Mom ([place]) and she will have her daughter call to schedule an office appt. With Dr. Adela Lank or an app. Follow-up from patient's ED visit on 01/15/19.

## 2019-01-19 NOTE — ED Triage Notes (Signed)
Pt arrives via EMS from home with reports of abd pain and CP. Endorses n/v. EMS gave 400 cc NS, 4 mg zofran. 20G L hand  CBG 486 140/80 120 HR 99%

## 2019-01-19 NOTE — ED Provider Notes (Signed)
[facility] EMERGENCY DEPARTMENT Provider Note   CSN: [id number] Arrival date & time: 01/19/19  1534     History Chief Complaint  Patient presents with  . Abdominal Pain  . Chest Pain    Nancy [name] is a 19 y.o. female with past medical history of type 2 diabetes, chronic abdominal pain with nausea vomiting, GERD, presenting to the emergency department with complaint of persistent nausea and vomiting with upper abdominal pain.  She was evaluated on 01/15/2019 for similar symptoms.  She states her symptoms are worse during that visit, however they persist today and therefore she presents for reevaluation.  She has been taking her numerous antiemetics at home without significant relief.  Her abdominal pain is in the upper abdomen described as a tingling sensation.  Her pain sometimes radiates to her left shoulder that is unchanged from few days ago.  Was diagnosed with a gastritis during last ED visit.  She has been taking her Protonix as directed as well.  She is followed by GI for her chronic abdominal pain with nausea and vomiting, similar to her presentation today.  No fevers or chills, no urinary symptoms, no diarrhea or constipation.  The history is provided by the patient and medical records.       Past Medical History:  Diagnosis Date  . Acanthosis nigricans   . Anxiety   . CHF (congestive heart failure) (HCC)   . Chronic lower back pain   . Depression   . DKA, type 1 (HCC) 09/13/2018  . Dyspepsia   . Obesity   . Ovarian cyst    pt is not aware of this hx (11/24/2017)  . 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 Premature baby   . Seizures (HCC)   . Type II diabetes mellitus (HCC)    insulin dependant    Patient Active Problem List   Diagnosis Date Noted  . Leukocytosis 12/19/2018  . Dehydration   . Pseudoseizures   . Uncontrolled diabetes mellitus (HCC) 11/28/2018  . Uncontrolled diabetes mellitus with hyperglycemia (HCC) 11/27/2018  . Severe recurrent  major depression without psychotic features (HCC) 11/08/2018  . MDD (major depressive disorder), recurrent episode, severe (HCC) 11/06/2018  . Nonspecific abnormal electrocardiogram (ECG) (EKG)   . Chest pain of uncertain etiology   . Hypertensive urgency 10/08/2018  . Conversion disorder with attacks or seizures, acute episode, with psychological stressor 09/16/2018  . MDD (major depressive disorder), recurrent severe, without psychosis (HCC)   . AKI (acute kidney injury) (HCC) 08/04/2018  . Seizures (HCC) 08/03/2018  . Depression 07/25/2018  . Syncope 01/30/2018  . Orthostatic hypotension 01/24/2018  . Tachycardia 12/28/2017  . Chronic abdominal pain 12/24/2017  . Chest pain 12/19/2017  . Nausea and vomiting 08/21/2017  . Generalized abdominal pain 08/21/2017  . Non compliance with medical treatment 01/27/2012  . Adjustment disorder 09/16/2011  . Acanthosis nigricans   . Goiter   . Obesity 06/14/2010  . Hypertension 06/14/2010    Past Surgical History:  Procedure Laterality Date  . ABDOMINAL HERNIA REPAIR     "I was a baby"  . BIOPSY  10/12/2018   Procedure: BIOPSY;  Surgeon: Lynann Bologna, MD;  Location: [facility] ENDOSCOPY;  Service: Endoscopy;;  . ESOPHAGOGASTRODUODENOSCOPY (EGD) WITH PROPOFOL N/A 10/12/2018   Procedure: ESOPHAGOGASTRODUODENOSCOPY (EGD) WITH PROPOFOL;  Surgeon: Lynann Bologna, MD;  Location: [facility] ENDOSCOPY;  Service: Endoscopy;  Laterality: N/A;  . HERNIA REPAIR    . LEFT HEART CATH AND CORONARY ANGIOGRAPHY N/A 10/13/2018   Procedure:  LEFT HEART CATH AND CORONARY ANGIOGRAPHY;  Surgeon: Kathleene Hazel, MD;  Location: [facility] INVASIVE CV LAB;  Service: Cardiovascular;  Laterality: N/A;  . TONSILLECTOMY AND ADENOIDECTOMY    . WISDOM TOOTH EXTRACTION  2017     OB History   No obstetric history on file.     Family History  Problem Relation Age of Onset  . Diabetes Mother   . Hypertension Mother   . Obesity Mother   . Asthma Mother   . Allergic rhinitis  Mother   . Eczema Mother   . Cervical cancer Mother   . Diabetes Father   . Hypertension Father   . Obesity Father   . Hyperlipidemia Father   . Hypertension Paternal Aunt   . Hypertension Maternal Grandfather   . Colon cancer Maternal Grandfather   . Diabetes Paternal Grandmother   . Obesity Paternal Grandmother   . Diabetes Paternal Grandfather   . Obesity Paternal Grandfather   . Angioedema Neg Hx   . Immunodeficiency Neg Hx   . Urticaria Neg Hx   . Stomach cancer Neg Hx   . Esophageal cancer Neg Hx     Social History   Tobacco Use  . Smoking status: Never Smoker  . Smokeless tobacco: Never Used  Substance Use Topics  . Alcohol use: No    Alcohol/week: 0.0 standard drinks  . Drug use: No    Home Medications Prior to Admission medications   Medication Sig Start Date End Date Taking? Authorizing Provider  atorvastatin (LIPITOR) 20 MG tablet Take 1 tablet (20 mg total) by mouth daily at 6 PM. Patient taking differently: Take 10 mg by mouth 2 (two) times daily.  10/05/18   Storm Frisk, MD  bisacodyl (DULCOLAX) 10 MG suppository Place 1 suppository (10 mg total) rectally daily. 12/24/18   Swayze, Ava, DO  busPIRone (BUSPAR) 10 MG tablet Take 10 mg by mouth daily.  11/19/18   [provider]  cycloSPORINE (RESTASIS) 0.05 % ophthalmic emulsion Place 1 drop into both eyes 2 (two) times daily.    [provider]  hydrOXYzine (ATARAX/VISTARIL) 50 MG tablet Take 1 tablet (50 mg total) by mouth 3 (three) times daily as needed for anxiety. Patient taking differently: Take 50 mg by mouth 3 (three) times daily.  11/11/18   Armandina Stammer I, NP  insulin glargine (LANTUS) 100 UNIT/ML injection Inject 0.6 mLs (60 Units total) into the skin at bedtime. For diabetes management 11/11/18   Armandina Stammer I, NP  insulin lispro (HUMALOG) 100 UNIT/ML injection Inject 0.15 mLs (15 Units total) into the skin 2 (two) times daily with a meal. Patient taking differently: Inject 15  Units into the skin See admin instructions. Inject 15 units subcutaneously up to twice daily with meals 10/05/18   Storm Frisk, MD  lactulose (CHRONULAC) 10 GM/15ML solution Take 30 mLs (20 g total) by mouth 3 (three) times daily. 12/23/18   Swayze, Ava, DO  lamoTRIgine (LAMICTAL) 25 MG tablet Take 2 tablets (50 mg total) by mouth 2 (two) times daily. For mood stabilization 11/11/18   Armandina Stammer I, NP  lubiprostone (AMITIZA) 24 MCG capsule Take 1 capsule (24 mcg total) by mouth 2 (two) times daily with a meal. Patient taking differently: Take 24 mcg by mouth daily with breakfast.  10/27/18   Esterwood, Amy S, PA-C  meloxicam (MOBIC) 7.5 MG tablet Take 7.5 mg by mouth daily with lunch.     [provider]  metoCLOPramide (REGLAN) 10 MG tablet  Take 1 tablet (10 mg total) by mouth 4 (four) times daily -  before meals and at bedtime. Patient taking differently: Take 10 mg by mouth daily before breakfast.  11/30/18 12/30/18  Donne Hazel, MD  mirtazapine (REMERON) 7.5 MG tablet Take 1 tablet (7.5 mg total) by mouth at bedtime. For depression/sleep 11/11/18   Lindell Spar I, NP  Multiple Vitamin (MULTIVITAMIN WITH MINERALS) TABS tablet Take 1 tablet by mouth daily. Patient not taking: Reported on 12/19/2018 09/18/18   Clapacs, Madie Reno, MD  Olopatadine HCl (PAZEO) 0.7 % SOLN Place 2 drops into both eyes 2 (two) times daily.     [provider]  pantoprazole (PROTONIX) 40 MG tablet Take 1 tablet (40 mg total) by mouth 2 (two) times daily. For acid reflux Patient not taking: Reported on 12/19/2018 11/11/18   Lindell Spar I, NP  polyethylene glycol (MIRALAX / GLYCOLAX) 17 g packet Take 17 g by mouth daily. 12/24/18   Swayze, Ava, DO  prochlorperazine (COMPAZINE) 25 MG suppository Place 1 suppository (25 mg total) rectally every 12 (twelve) hours as needed for nausea or vomiting. 12/05/18   Deno Etienne, DO  promethazine (PHENERGAN) 25 MG suppository Place 25 mg rectally every 8 (eight)  hours as needed for nausea or vomiting.    [provider]  propranolol (INDERAL) 20 MG tablet Take 1 tablet (20 mg total) by mouth 2 (two) times daily. For anxiety/HTN 12/23/18   Swayze, Ava, DO  QUEtiapine (SEROQUEL) 200 MG tablet Take 2 tablets (400 mg total) by mouth at bedtime. 01/15/19   Blanchie Dessert, MD  topiramate (TOPAMAX) 25 MG tablet Take 1 tablet (25 mg total) by mouth 2 (two) times daily. For seizure activities 11/11/18   Lindell Spar I, NP    Allergies    Ibuprofen and Oatmeal  Review of Systems   Review of Systems  All other systems reviewed and are negative.   Physical Exam Updated Vital Signs BP (!) 160/102(BP Location: Right Arm)   Pulse 95   Temp 98.2 F (36.8 C) (Oral)   Resp 16   LMP 01/08/2019   SpO2 100%   Physical Exam Vitals and nursing note reviewed.  Constitutional:      General: She is not in acute distress.    Appearance: She is well-developed. She is not ill-appearing.  HENT:     Head: Normocephalic and atraumatic.  Eyes:     Conjunctiva/sclera: Conjunctivae normal.  Cardiovascular:     Rate and Rhythm: Normal rate and regular rhythm.  Pulmonary:     Effort: Pulmonary effort is normal. No respiratory distress.     Breath sounds: Normal breath sounds.  Abdominal:     General: Bowel sounds are normal.     Palpations: Abdomen is soft.     Tenderness: There is generalized abdominal tenderness. There is no guarding or rebound.  Skin:    General: Skin is warm.  Neurological:     Mental Status: She is alert.  Psychiatric:        Behavior: Behavior normal.     ED Results / Procedures / Treatments   Labs (all labs ordered are listed, but only abnormal results are displayed) Labs Reviewed  COMPREHENSIVE METABOLIC PANEL - Abnormal; Notable for the following components:      Result Value   CO2 21 (*)    Glucose, Bld 331 (*)    All other components within normal limits  LIPASE, BLOOD  CBC  URINALYSIS, ROUTINE W REFLEX  MICROSCOPIC  I-STAT BETA HCG BLOOD, ED ([facility], [facility], [facility] ONLY)    EKG None  Radiology No results found.  Procedures Procedures (including critical care time)  Medications Ordered in ED Medications  HYDROmorphone (DILAUDID) injection 1 mg (has no administration in time range)  sodium chloride 0.9 % bolus 1,000 mL (0 mLs Intravenous Stopped 01/19/19 1815)  morphine 4 MG/ML injection 4 mg (4 mg Intravenous Given 01/19/19 1726)  metoCLOPramide (REGLAN) injection 10 mg (10 mg Intravenous Given 01/19/19 1729)  pantoprazole (PROTONIX) injection 40 mg (40 mg Intravenous Given 01/19/19 1730)    ED Course  I have reviewed the triage vital signs and the nursing notes.  Pertinent labs & imaging results that were available during my care of the patient were reviewed by me and considered in my medical decision making (see chart for details).  Clinical Course as of Jan 19 1939  Wed Jan 19, 2019  1909 Pt reports nausea is much improved, she feels she is ready to eat. Her stomach pain lingers. Will redose pain medication, and PO challenge with anticipated d/c. Pt feels comfortable with this plan   [JR]    Clinical Course User Index [JR] Robinson, [place] N, PA-C   MDM Rules/Calculators/A&P                      Patient with history of chronic abdominal pain with nausea and vomiting, presenting with symptoms of the same.  She was evaluated 4 days ago with similar symptoms, diagnosed with a likely gastritis and discharged with symptomatic management.  She states her pain is not as severe as it was during last visit though somewhat persists with the nausea vomiting.  CBC today with significant provement in white count, down to 8.6 from 15.1 four days ago.  She is hyperglycemic today with sugar of 330 though normal gap, electrolytes are otherwise unremarkable.  Lipase is normal limits.  hCG is negative.  On exam today, she is afebrile and in no distress.  Abdomen with generalized tenderness though no guarding  or rebound.  Low suspicion for emergent/surgical intra-abdominal pathology given significant improvement in white count and improvement in symptoms.  Will treat symptomatically, similar to prior ED visit and reevaluate.  Patient states nausea and vomiting has resolved and she feels ready to eat and drink.  She reports hunger.  Her pain somewhat persists.  Will redose with pain medicine and p.o. challenge.  Patient tolerating liquids and requesting more.  She feels comfortable discharge at this time with close outpatient follow-up with her GI specialist.  Return precautions discussed.  Safe for discharge.  Discussed results, findings, treatment and follow up. Patient advised of return precautions. Patient verbalized understanding and agreed with plan.  Final Clinical Impression(s) / ED Diagnoses Final diagnoses:  Generalized abdominal pain  Non-intractable vomiting with nausea, unspecified vomiting type    Rx / DC Orders ED Discharge Orders    None       Robinson, [place] N, PA-C 01/19/19 1942    Maia Plan, MD 01/20/19 1249

## 2019-01-26 ENCOUNTER — Inpatient Hospital Stay (HOSPITAL_COMMUNITY)
Admission: EM | Admit: 2019-01-26 | Discharge: 2019-01-29 | DRG: 638 | Disposition: A | Discharge status: Home / Self Care | Payer: Medicaid Other | Attending: Internal Medicine | Admitting: Internal Medicine

## 2019-01-26 ENCOUNTER — Other Ambulatory Visit: Payer: Self-pay

## 2019-01-26 ENCOUNTER — Encounter (HOSPITAL_COMMUNITY): Payer: Self-pay

## 2019-01-26 DIAGNOSIS — Z800 Family history of malignant neoplasm of digestive organs: Secondary | ICD-10-CM

## 2019-01-26 DIAGNOSIS — E860 Dehydration: Secondary | ICD-10-CM | POA: Diagnosis present

## 2019-01-26 DIAGNOSIS — Z79899 Other long term (current) drug therapy: Secondary | ICD-10-CM

## 2019-01-26 DIAGNOSIS — R112 Nausea with vomiting, unspecified: Secondary | ICD-10-CM

## 2019-01-26 DIAGNOSIS — Z8249 Family history of ischemic heart disease and other diseases of the circulatory system: Secondary | ICD-10-CM

## 2019-01-26 DIAGNOSIS — E1010 Type 1 diabetes mellitus with ketoacidosis without coma: Principal | ICD-10-CM | POA: Diagnosis present

## 2019-01-26 DIAGNOSIS — R0789 Other chest pain: Secondary | ICD-10-CM | POA: Diagnosis present

## 2019-01-26 DIAGNOSIS — R109 Unspecified abdominal pain: Secondary | ICD-10-CM

## 2019-01-26 DIAGNOSIS — Z6834 Body mass index (BMI) 34.0-34.9, adult: Secondary | ICD-10-CM

## 2019-01-26 DIAGNOSIS — Z833 Family history of diabetes mellitus: Secondary | ICD-10-CM

## 2019-01-26 DIAGNOSIS — F419 Anxiety disorder, unspecified: Secondary | ICD-10-CM | POA: Diagnosis present

## 2019-01-26 DIAGNOSIS — G40909 Epilepsy, unspecified, not intractable, without status epilepticus: Secondary | ICD-10-CM | POA: Diagnosis present

## 2019-01-26 DIAGNOSIS — F329 Major depressive disorder, single episode, unspecified: Secondary | ICD-10-CM | POA: Diagnosis present

## 2019-01-26 DIAGNOSIS — K920 Hematemesis: Secondary | ICD-10-CM | POA: Diagnosis present

## 2019-01-26 DIAGNOSIS — Z20822 Contact with and (suspected) exposure to covid-19: Secondary | ICD-10-CM | POA: Diagnosis present

## 2019-01-26 DIAGNOSIS — E1110 Type 2 diabetes mellitus with ketoacidosis without coma: Secondary | ICD-10-CM | POA: Diagnosis present

## 2019-01-26 DIAGNOSIS — Z8349 Family history of other endocrine, nutritional and metabolic diseases: Secondary | ICD-10-CM

## 2019-01-26 DIAGNOSIS — Z8049 Family history of malignant neoplasm of other genital organs: Secondary | ICD-10-CM

## 2019-01-26 DIAGNOSIS — E669 Obesity, unspecified: Secondary | ICD-10-CM | POA: Diagnosis present

## 2019-01-26 DIAGNOSIS — I959 Hypotension, unspecified: Secondary | ICD-10-CM | POA: Diagnosis present

## 2019-01-26 DIAGNOSIS — Z794 Long term (current) use of insulin: Secondary | ICD-10-CM

## 2019-01-26 DIAGNOSIS — Z825 Family history of asthma and other chronic lower respiratory diseases: Secondary | ICD-10-CM

## 2019-01-26 DIAGNOSIS — I10 Essential (primary) hypertension: Secondary | ICD-10-CM | POA: Diagnosis present

## 2019-01-26 LAB — COMPREHENSIVE METABOLIC PANEL
ALT: 18 U/L (ref 0–44)
AST: 15 U/L (ref 15–41)
Albumin: 4.1 g/dL (ref 3.5–5.0)
Alkaline Phosphatase: 91 U/L (ref 38–126)
Anion gap: 17 — ABNORMAL HIGH (ref 5–15)
BUN: 9 mg/dL (ref 6–20)
CO2: 18 mmol/L — ABNORMAL LOW (ref 22–32)
Calcium: 9.2 mg/dL (ref 8.9–10.3)
Chloride: 101 mmol/L (ref 98–111)
Creatinine, Ser: 0.65 mg/dL (ref 0.44–1.00)
GFR calc Af Amer: >60 mL/min (ref >60)
GFR calc non Af Amer: >60 mL/min (ref >60)
Glucose, Bld: 410 mg/dL — ABNORMAL HIGH (ref 70–99)
Potassium: 4.2 mmol/L (ref 3.5–5.1)
Sodium: 136 mmol/L (ref 135–145)
Total Bilirubin: 0.8 mg/dL (ref 0.3–1.2)
Total Protein: 7.4 g/dL (ref 6.5–8.1)

## 2019-01-26 LAB — POC SARS CORONAVIRUS 2 AG -  ED: SARS Coronavirus 2 Ag: NEGATIVE

## 2019-01-26 LAB — CBC
HCT: 43 % (ref 36.0–46.0)
Hemoglobin: 13.9 g/dL (ref 12.0–15.0)
MCH: 26.4 pg (ref 26.0–34.0)
MCHC: 32.3 g/dL (ref 30.0–36.0)
MCV: 81.7 fL (ref 80.0–100.0)
Platelets: 322 10*3/uL (ref 150–400)
RBC: 5.26 MIL/uL — ABNORMAL HIGH (ref 3.87–5.11)
RDW: 13.2 % (ref 11.5–15.5)
WBC: 10.9 10*3/uL — ABNORMAL HIGH (ref 4.0–10.5)
nRBC: 0 % (ref 0.0–0.2)

## 2019-01-26 LAB — LIPASE, BLOOD: Lipase: 18 U/L (ref 11–51)

## 2019-01-26 LAB — I-STAT BETA HCG BLOOD, ED (MC, WL, AP ONLY): I-stat hCG, quantitative: <5 m[IU]/mL (ref <5)

## 2019-01-26 IMAGING — US US ABDOMEN LIMITED
1 series · 14 of 25 positions shown · non-contrast
Comparison: 12/24/2017 abdominal CT

CLINICAL DATA: Abdominal pain

EXAM:
ULTRASOUND ABDOMEN LIMITED RIGHT UPPER QUADRANT

[Series 1: us abdomen limited · 14 of 53 slices shown]
[im 1/53]
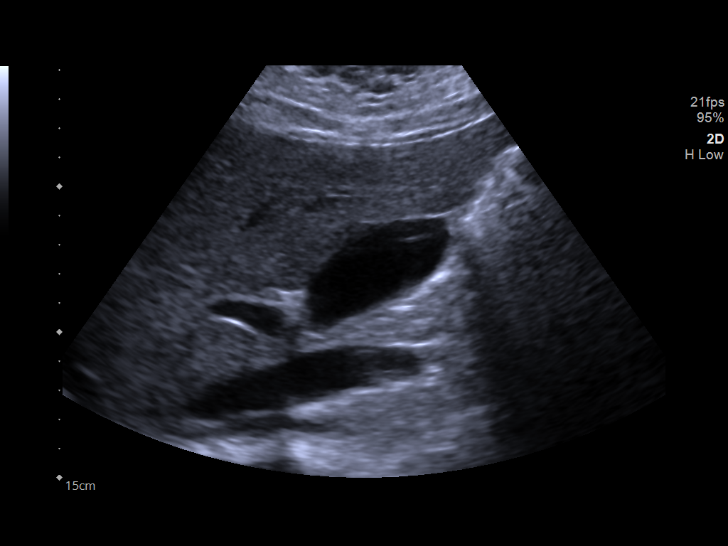
[im 5/53]
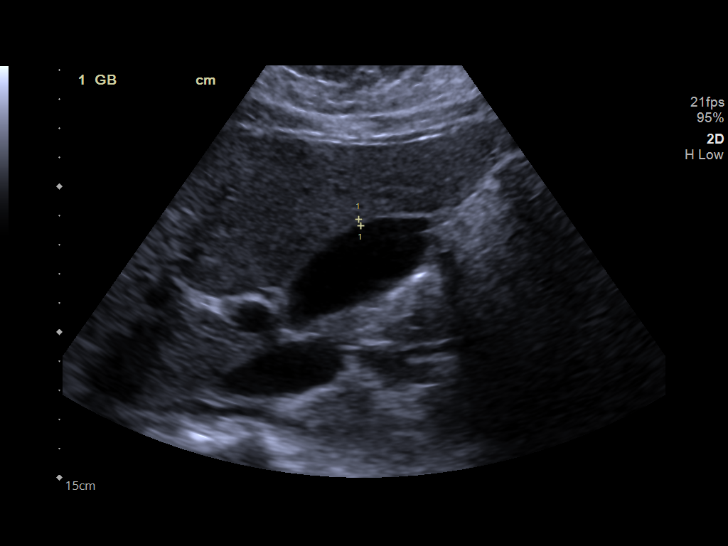
[im 9/53]
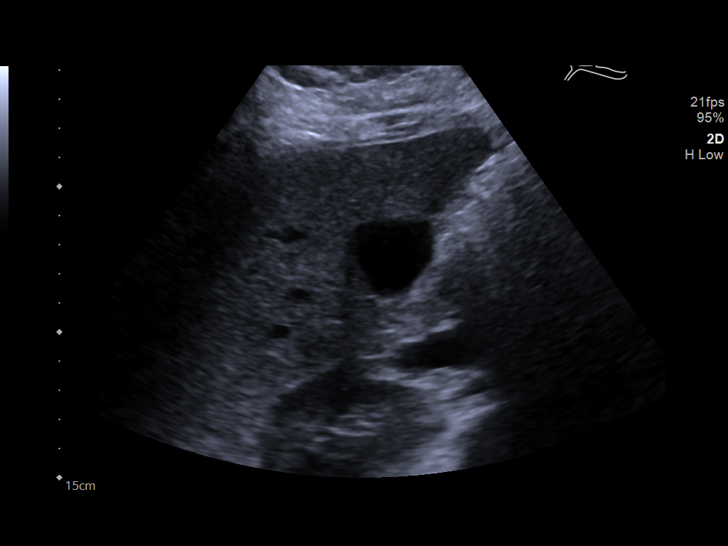
[im 14/53]
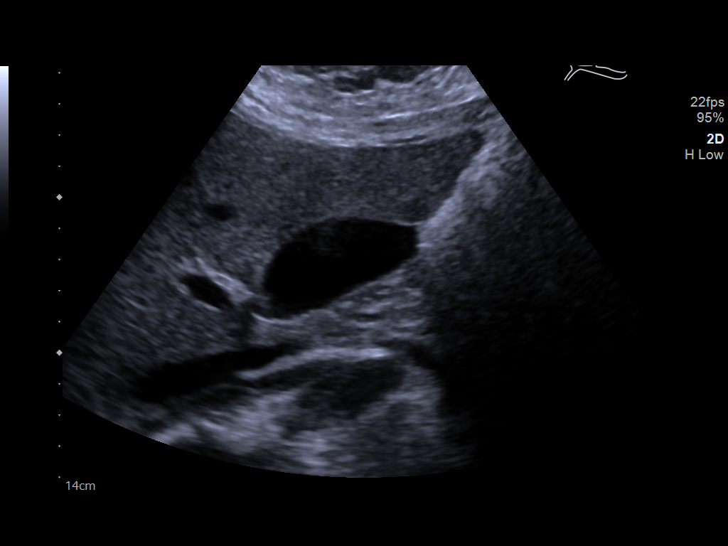
[im 18/53]
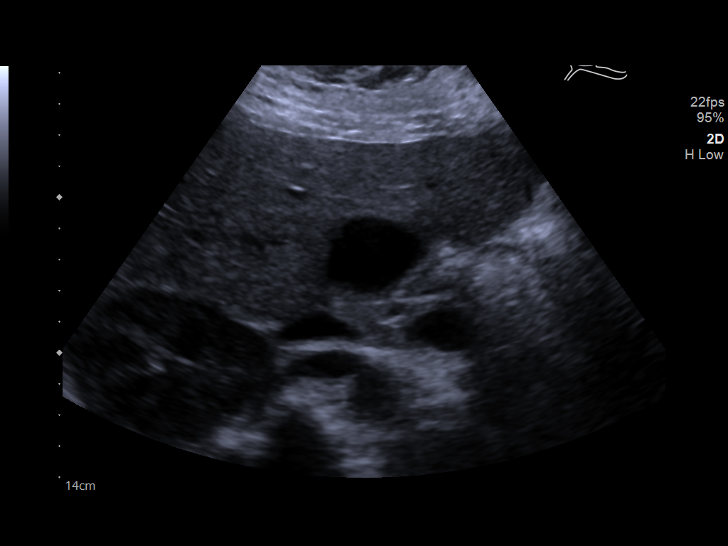
[im 20/53]
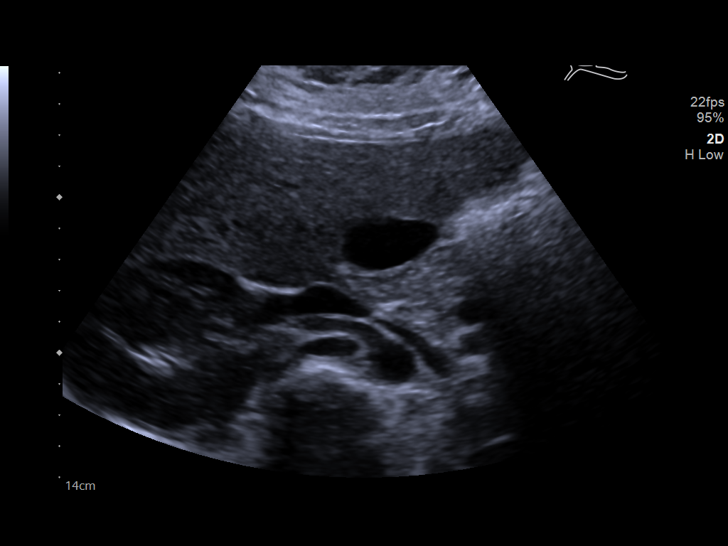
[im 24/53]
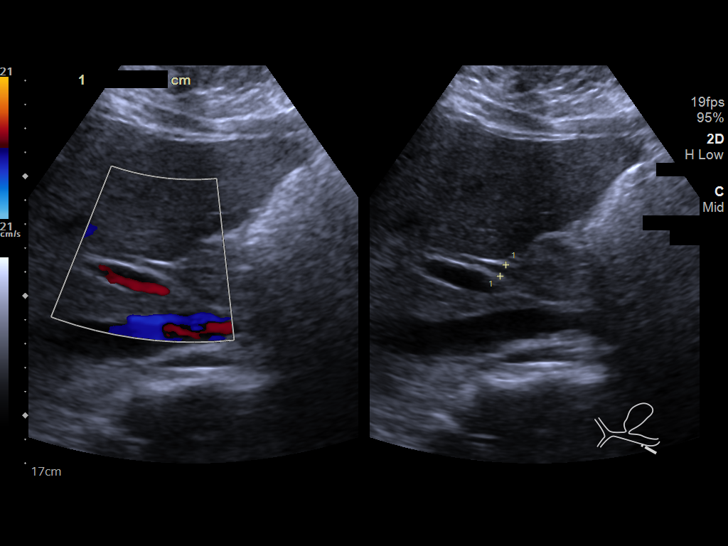
[im 29/53]
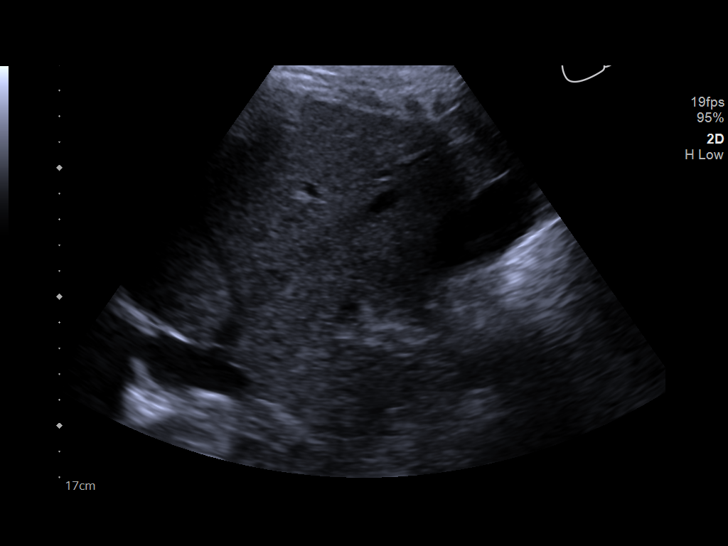
[im 33/53]
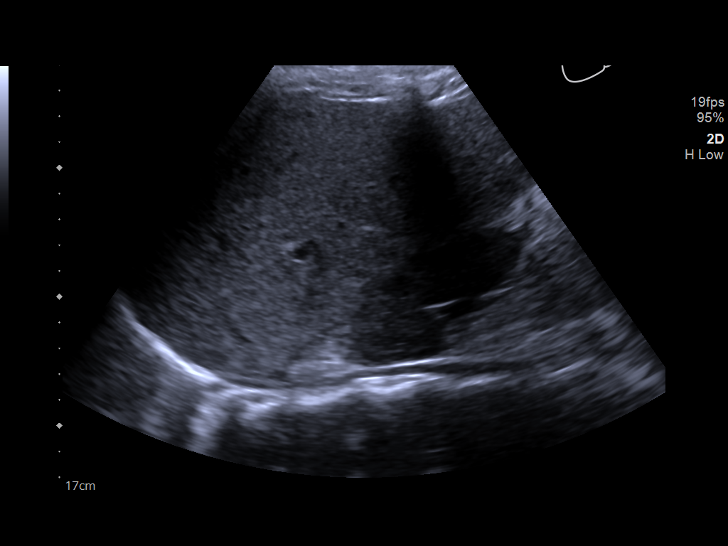
[im 35/53]
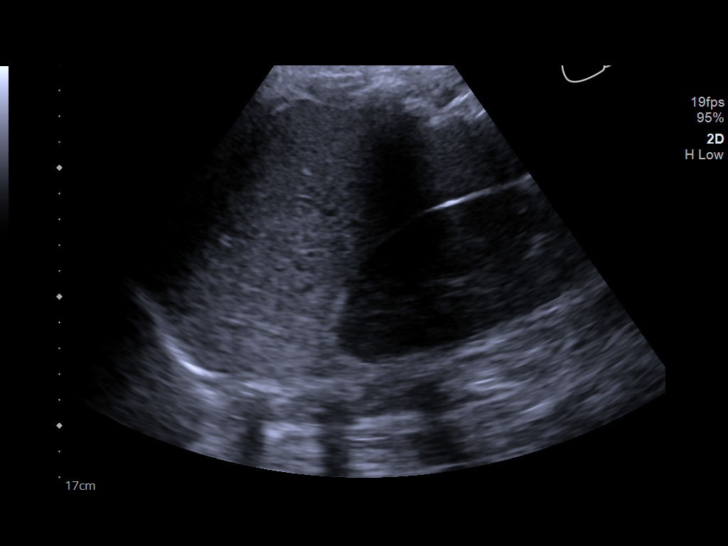
[im 40/53]
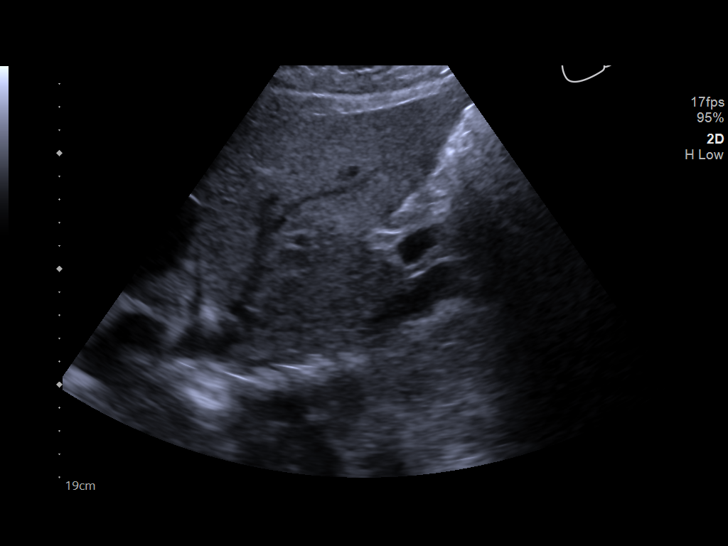
[im 44/53]
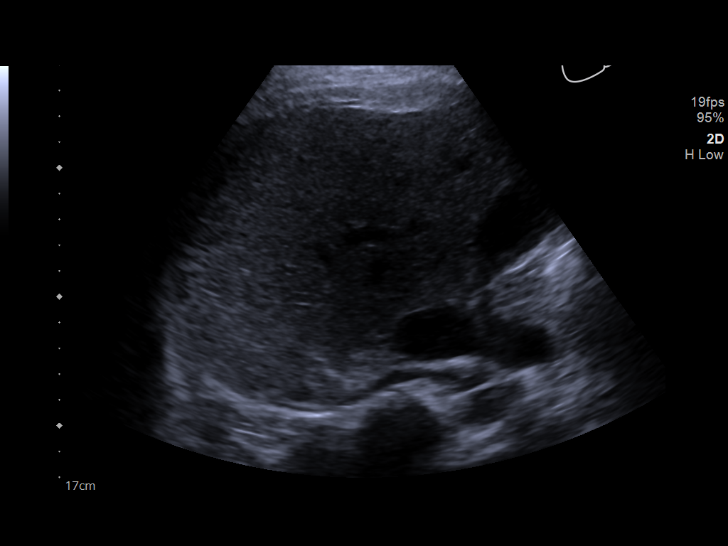
[im 48/53]
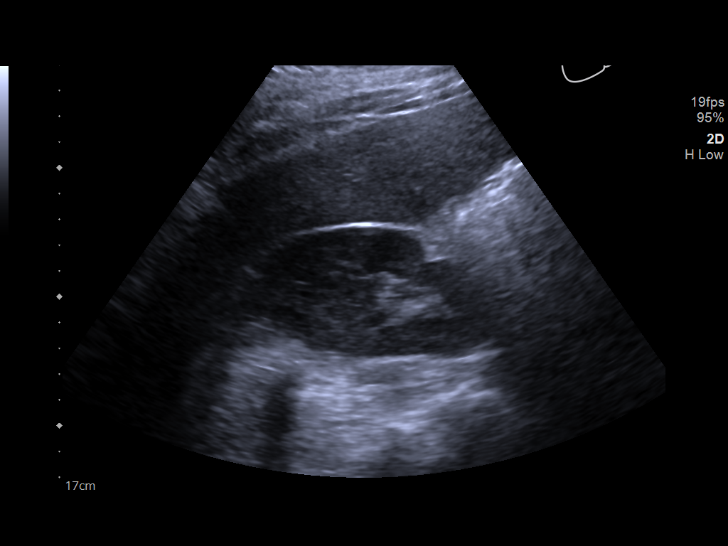
[im 53/53]
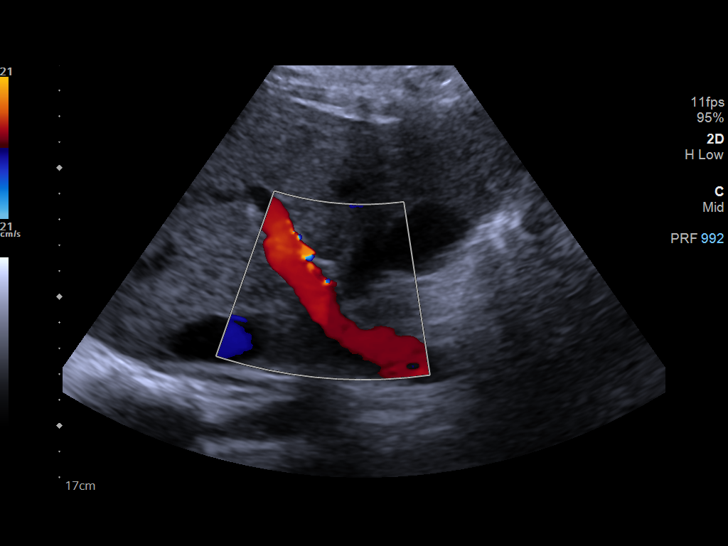

[14 of 25 positions shown; findings below may reference images not displayed]

FINDINGS: Gallbladder:

No gallstones or wall thickening visualized. No sonographic Murphy
sign noted by sonographer.

Common bile duct:

Diameter: 5 mm.  Where visualized, no filling defect.

Liver:

No focal lesion identified. Within normal limits in parenchymal
echogenicity. Portal vein is patent on color Doppler imaging with
normal direction of blood flow towards the liver.
IMPRESSION: Negative right upper quadrant ultrasound.

## 2019-01-26 MED ORDER — SODIUM CHLORIDE 0.9% FLUSH: 3.0000 mL | Freq: Once | INTRAVENOUS | Status: DC

## 2019-01-26 NOTE — ED Triage Notes (Signed)
To triage via EMS  From home.  Onset 2 days loss of taste and smell.  Onset 2 weeks N/V, x >20 today, last vomit PTA, abd and chest pain.  Seizures x 8 today.  EMS BP 160/96 HR 114-133 - pt states HR normally fast,  RR 18 T 97.7

## 2019-01-27 ENCOUNTER — Observation Stay (HOSPITAL_COMMUNITY): Payer: Medicaid Other

## 2019-01-27 ENCOUNTER — Encounter (HOSPITAL_COMMUNITY): Payer: Self-pay | Admitting: Internal Medicine

## 2019-01-27 ENCOUNTER — Emergency Department (HOSPITAL_COMMUNITY): Payer: Medicaid Other

## 2019-01-27 DIAGNOSIS — G40909 Epilepsy, unspecified, not intractable, without status epilepticus: Secondary | ICD-10-CM | POA: Diagnosis present

## 2019-01-27 DIAGNOSIS — Z79899 Other long term (current) drug therapy: Secondary | ICD-10-CM | POA: Diagnosis not present

## 2019-01-27 DIAGNOSIS — F329 Major depressive disorder, single episode, unspecified: Secondary | ICD-10-CM | POA: Diagnosis present

## 2019-01-27 DIAGNOSIS — Z825 Family history of asthma and other chronic lower respiratory diseases: Secondary | ICD-10-CM | POA: Diagnosis not present

## 2019-01-27 DIAGNOSIS — E1010 Type 1 diabetes mellitus with ketoacidosis without coma: Principal | ICD-10-CM

## 2019-01-27 DIAGNOSIS — R569 Unspecified convulsions: Secondary | ICD-10-CM

## 2019-01-27 DIAGNOSIS — Z8249 Family history of ischemic heart disease and other diseases of the circulatory system: Secondary | ICD-10-CM | POA: Diagnosis not present

## 2019-01-27 DIAGNOSIS — I10 Essential (primary) hypertension: Secondary | ICD-10-CM | POA: Diagnosis present

## 2019-01-27 DIAGNOSIS — E669 Obesity, unspecified: Secondary | ICD-10-CM | POA: Diagnosis present

## 2019-01-27 DIAGNOSIS — I959 Hypotension, unspecified: Secondary | ICD-10-CM | POA: Diagnosis present

## 2019-01-27 DIAGNOSIS — Z794 Long term (current) use of insulin: Secondary | ICD-10-CM | POA: Diagnosis not present

## 2019-01-27 DIAGNOSIS — R0789 Other chest pain: Secondary | ICD-10-CM | POA: Diagnosis present

## 2019-01-27 DIAGNOSIS — Z8049 Family history of malignant neoplasm of other genital organs: Secondary | ICD-10-CM | POA: Diagnosis not present

## 2019-01-27 DIAGNOSIS — Z8349 Family history of other endocrine, nutritional and metabolic diseases: Secondary | ICD-10-CM | POA: Diagnosis not present

## 2019-01-27 DIAGNOSIS — R1084 Generalized abdominal pain: Secondary | ICD-10-CM | POA: Diagnosis not present

## 2019-01-27 DIAGNOSIS — E860 Dehydration: Secondary | ICD-10-CM | POA: Diagnosis present

## 2019-01-27 DIAGNOSIS — E1110 Type 2 diabetes mellitus with ketoacidosis without coma: Secondary | ICD-10-CM | POA: Diagnosis present

## 2019-01-27 DIAGNOSIS — R112 Nausea with vomiting, unspecified: Secondary | ICD-10-CM | POA: Diagnosis not present

## 2019-01-27 DIAGNOSIS — Z833 Family history of diabetes mellitus: Secondary | ICD-10-CM | POA: Diagnosis not present

## 2019-01-27 DIAGNOSIS — Z800 Family history of malignant neoplasm of digestive organs: Secondary | ICD-10-CM | POA: Diagnosis not present

## 2019-01-27 DIAGNOSIS — Z6834 Body mass index (BMI) 34.0-34.9, adult: Secondary | ICD-10-CM | POA: Diagnosis not present

## 2019-01-27 DIAGNOSIS — Z20822 Contact with and (suspected) exposure to covid-19: Secondary | ICD-10-CM | POA: Diagnosis present

## 2019-01-27 DIAGNOSIS — K920 Hematemesis: Secondary | ICD-10-CM | POA: Diagnosis present

## 2019-01-27 DIAGNOSIS — F419 Anxiety disorder, unspecified: Secondary | ICD-10-CM | POA: Diagnosis present

## 2019-01-27 LAB — BASIC METABOLIC PANEL
Anion gap: 9 (ref 5–15)
Anion gap: 11 (ref 5–15)
Anion gap: 9 (ref 5–15)
Anion gap: 9 (ref 5–15)
Anion gap: 9 (ref 5–15)
BUN: 10 mg/dL (ref 6–20)
BUN: 9 mg/dL (ref 6–20)
BUN: 9 mg/dL (ref 6–20)
BUN: 8 mg/dL (ref 6–20)
BUN: 9 mg/dL (ref 6–20)
CO2: 19 mmol/L — ABNORMAL LOW (ref 22–32)
CO2: 19 mmol/L — ABNORMAL LOW (ref 22–32)
CO2: 19 mmol/L — ABNORMAL LOW (ref 22–32)
CO2: 20 mmol/L — ABNORMAL LOW (ref 22–32)
CO2: 19 mmol/L — ABNORMAL LOW (ref 22–32)
Calcium: 8.2 mg/dL — ABNORMAL LOW (ref 8.9–10.3)
Calcium: 8.3 mg/dL — ABNORMAL LOW (ref 8.9–10.3)
Calcium: 8.5 mg/dL — ABNORMAL LOW (ref 8.9–10.3)
Calcium: 8.4 mg/dL — ABNORMAL LOW (ref 8.9–10.3)
Calcium: 8.5 mg/dL — ABNORMAL LOW (ref 8.9–10.3)
Chloride: 107 mmol/L (ref 98–111)
Chloride: 106 mmol/L (ref 98–111)
Chloride: 106 mmol/L (ref 98–111)
Chloride: 103 mmol/L (ref 98–111)
Chloride: 107 mmol/L (ref 98–111)
Creatinine, Ser: 0.56 mg/dL (ref 0.44–1.00)
Creatinine, Ser: 0.53 mg/dL (ref 0.44–1.00)
Creatinine, Ser: 0.51 mg/dL (ref 0.44–1.00)
Creatinine, Ser: 0.56 mg/dL (ref 0.44–1.00)
Creatinine, Ser: 0.53 mg/dL (ref 0.44–1.00)
GFR calc Af Amer: >60 mL/min (ref >60)
GFR calc Af Amer: >60 mL/min (ref >60)
GFR calc Af Amer: >60 mL/min (ref >60)
GFR calc Af Amer: >60 mL/min (ref >60)
GFR calc Af Amer: >60 mL/min (ref >60)
GFR calc non Af Amer: >60 mL/min (ref >60)
GFR calc non Af Amer: >60 mL/min (ref >60)
GFR calc non Af Amer: >60 mL/min (ref >60)
GFR calc non Af Amer: >60 mL/min (ref >60)
GFR calc non Af Amer: >60 mL/min (ref >60)
Glucose, Bld: 175 mg/dL — ABNORMAL HIGH (ref 70–99)
Glucose, Bld: 146 mg/dL — ABNORMAL HIGH (ref 70–99)
Glucose, Bld: 131 mg/dL — ABNORMAL HIGH (ref 70–99)
Glucose, Bld: 232 mg/dL — ABNORMAL HIGH (ref 70–99)
Glucose, Bld: 166 mg/dL — ABNORMAL HIGH (ref 70–99)
Potassium: 3.5 mmol/L (ref 3.5–5.1)
Potassium: 4 mmol/L (ref 3.5–5.1)
Potassium: 3.7 mmol/L (ref 3.5–5.1)
Potassium: 3.7 mmol/L (ref 3.5–5.1)
Potassium: 5.4 mmol/L — ABNORMAL HIGH (ref 3.5–5.1)
Sodium: 135 mmol/L (ref 135–145)
Sodium: 136 mmol/L (ref 135–145)
Sodium: 134 mmol/L — ABNORMAL LOW (ref 135–145)
Sodium: 132 mmol/L — ABNORMAL LOW (ref 135–145)
Sodium: 135 mmol/L (ref 135–145)

## 2019-01-27 LAB — POCT I-STAT EG7
Acid-base deficit: 7 mmol/L — ABNORMAL HIGH (ref 0.0–2.0)
Bicarbonate: 18.8 mmol/L — ABNORMAL LOW (ref 20.0–28.0)
Calcium, Ion: 1.17 mmol/L (ref 1.15–1.40)
HCT: 46 % (ref 36.0–46.0)
Hemoglobin: 15.6 g/dL — ABNORMAL HIGH (ref 12.0–15.0)
O2 Saturation: 52 %
Potassium: 4.2 mmol/L (ref 3.5–5.1)
Sodium: 133 mmol/L — ABNORMAL LOW (ref 135–145)
TCO2: 20 mmol/L — ABNORMAL LOW (ref 22–32)
pCO2, Ven: 38.2 mmHg — ABNORMAL LOW (ref 44.0–60.0)
pH, Ven: 7.299 (ref 7.250–7.430)
pO2, Ven: 31 mmHg — CL (ref 32.0–45.0)

## 2019-01-27 LAB — URINALYSIS, ROUTINE W REFLEX MICROSCOPIC
Bilirubin Urine: NEGATIVE
Glucose, UA: >=500 mg/dL — AB
Ketones, ur: 80 mg/dL — AB
Leukocytes,Ua: NEGATIVE
Nitrite: NEGATIVE
Protein, ur: 30 mg/dL — AB
Specific Gravity, Urine: 1.032 — ABNORMAL HIGH (ref 1.005–1.030)
pH: 5 (ref 5.0–8.0)

## 2019-01-27 LAB — CBC
HCT: 38.6 % (ref 36.0–46.0)
Hemoglobin: 12.3 g/dL (ref 12.0–15.0)
MCH: 26.7 pg (ref 26.0–34.0)
MCHC: 31.9 g/dL (ref 30.0–36.0)
MCV: 83.9 fL (ref 80.0–100.0)
Platelets: 322 10*3/uL (ref 150–400)
RBC: 4.6 MIL/uL (ref 3.87–5.11)
RDW: 13.5 % (ref 11.5–15.5)
WBC: 15.2 10*3/uL — ABNORMAL HIGH (ref 4.0–10.5)
nRBC: 0 % (ref 0.0–0.2)

## 2019-01-27 LAB — GLUCOSE, CAPILLARY
Glucose-Capillary: 158 mg/dL — ABNORMAL HIGH (ref 70–99)
Glucose-Capillary: 132 mg/dL — ABNORMAL HIGH (ref 70–99)
Glucose-Capillary: 215 mg/dL — ABNORMAL HIGH (ref 70–99)
Glucose-Capillary: 166 mg/dL — ABNORMAL HIGH (ref 70–99)
Glucose-Capillary: 119 mg/dL — ABNORMAL HIGH (ref 70–99)
Glucose-Capillary: 207 mg/dL — ABNORMAL HIGH (ref 70–99)
Glucose-Capillary: 291 mg/dL — ABNORMAL HIGH (ref 70–99)
Glucose-Capillary: 279 mg/dL — ABNORMAL HIGH (ref 70–99)
Glucose-Capillary: 207 mg/dL — ABNORMAL HIGH (ref 70–99)
Glucose-Capillary: 165 mg/dL — ABNORMAL HIGH (ref 70–99)
Glucose-Capillary: 155 mg/dL — ABNORMAL HIGH (ref 70–99)
Glucose-Capillary: 171 mg/dL — ABNORMAL HIGH (ref 70–99)
Glucose-Capillary: 189 mg/dL — ABNORMAL HIGH (ref 70–99)
Glucose-Capillary: 235 mg/dL — ABNORMAL HIGH (ref 70–99)

## 2019-01-27 LAB — BETA-HYDROXYBUTYRIC ACID
Beta-Hydroxybutyric Acid: 1.36 mmol/L — ABNORMAL HIGH (ref 0.05–0.27)
Beta-Hydroxybutyric Acid: 0.13 mmol/L (ref 0.05–0.27)

## 2019-01-27 LAB — TROPONIN I (HIGH SENSITIVITY)
Troponin I (High Sensitivity): <2 ng/L (ref <18)
Troponin I (High Sensitivity): <2 ng/L (ref <18)

## 2019-01-27 LAB — LACTIC ACID, PLASMA: Lactic Acid, Venous: 1.3 mmol/L (ref 0.5–1.9)

## 2019-01-27 LAB — SARS CORONAVIRUS 2 (TAT 6-24 HRS): SARS Coronavirus 2: NEGATIVE

## 2019-01-27 LAB — CBG MONITORING, ED
Glucose-Capillary: 267 mg/dL — ABNORMAL HIGH (ref 70–99)
Glucose-Capillary: 177 mg/dL — ABNORMAL HIGH (ref 70–99)
Glucose-Capillary: 158 mg/dL — ABNORMAL HIGH (ref 70–99)

## 2019-01-27 LAB — RESPIRATORY PANEL BY RT PCR (FLU A&B, COVID)
Influenza A by PCR: NEGATIVE
Influenza B by PCR: NEGATIVE
SARS Coronavirus 2 by RT PCR: NEGATIVE

## 2019-01-27 LAB — POC SARS CORONAVIRUS 2 AG -  ED: SARS Coronavirus 2 Ag: NEGATIVE

## 2019-01-27 MED ORDER — DEXTROSE-NACL 5-0.45 % IV SOLN: INTRAVENOUS | Status: DC

## 2019-01-27 MED ORDER — INSULIN GLARGINE 100 UNIT/ML ~~LOC~~ SOLN
48.0000 [IU] | SUBCUTANEOUS | Status: DC
  Administered 2019-01-27: 48 [IU] via SUBCUTANEOUS
  Filled 2019-01-27 (×2): qty 0.48

## 2019-01-27 MED ORDER — METOCLOPRAMIDE HCL 5 MG/ML IJ SOLN
10.0000 mg | Freq: Once | INTRAMUSCULAR | Status: AC
  Administered 2019-01-27: 10 mg via INTRAVENOUS
  Filled 2019-01-27: qty 2

## 2019-01-27 MED ORDER — ENOXAPARIN SODIUM 40 MG/0.4ML ~~LOC~~ SOLN
40.0000 mg | SUBCUTANEOUS | Status: DC
  Administered 2019-01-27 – 2019-01-28 (×2): 40 mg via SUBCUTANEOUS
  Filled 2019-01-27 (×2): qty 0.4

## 2019-01-27 MED ORDER — MIRTAZAPINE 7.5 MG PO TABS
7.5000 mg | ORAL_TABLET | Freq: Every day | ORAL | Status: DC
  Administered 2019-01-28 (×2): 7.5 mg via ORAL
  Filled 2019-01-27 (×2): qty 1

## 2019-01-27 MED ORDER — SODIUM CHLORIDE 0.9 % IV SOLN: INTRAVENOUS | Status: DC

## 2019-01-27 MED ORDER — INSULIN REGULAR(HUMAN) IN NACL 100-0.9 UT/100ML-% IV SOLN
INTRAVENOUS | Status: DC
  Administered 2019-01-27: 10.5 [IU]/h via INTRAVENOUS
  Filled 2019-01-27: qty 100

## 2019-01-27 MED ORDER — OLOPATADINE HCL 0.7 % OP SOLN: 2.0000 [drp] | Freq: Two times a day (BID) | OPHTHALMIC | Status: DC

## 2019-01-27 MED ORDER — NITROGLYCERIN 0.4 MG SL SUBL
SUBLINGUAL_TABLET | SUBLINGUAL | Status: AC
  Administered 2019-01-27: 0.4 mg
  Filled 2019-01-27: qty 1

## 2019-01-27 MED ORDER — TOPIRAMATE 25 MG PO TABS
25.0000 mg | ORAL_TABLET | Freq: Two times a day (BID) | ORAL | Status: DC
  Administered 2019-01-27 – 2019-01-29 (×5): 25 mg via ORAL
  Filled 2019-01-27 (×6): qty 1

## 2019-01-27 MED ORDER — OLOPATADINE HCL 0.1 % OP SOLN
1.0000 [drp] | Freq: Two times a day (BID) | OPHTHALMIC | Status: DC
  Administered 2019-01-27 – 2019-01-29 (×5): 1 [drp] via OPHTHALMIC
  Filled 2019-01-27: qty 5

## 2019-01-27 MED ORDER — PANTOPRAZOLE SODIUM 40 MG IV SOLR
40.0000 mg | Freq: Once | INTRAVENOUS | Status: AC
  Administered 2019-01-27: 40 mg via INTRAVENOUS
  Filled 2019-01-27: qty 40

## 2019-01-27 MED ORDER — DEXTROSE 50 % IV SOLN: 0.0000 mL | INTRAVENOUS | Status: DC | PRN

## 2019-01-27 MED ORDER — SODIUM CHLORIDE 0.45 % IV SOLN: INTRAVENOUS | Status: DC

## 2019-01-27 MED ORDER — BUSPIRONE HCL 5 MG PO TABS
10.0000 mg | ORAL_TABLET | Freq: Two times a day (BID) | ORAL | Status: DC
  Administered 2019-01-27 – 2019-01-29 (×5): 10 mg via ORAL
  Filled 2019-01-27 (×5): qty 2

## 2019-01-27 MED ORDER — CYCLOSPORINE 0.05 % OP EMUL
1.0000 [drp] | Freq: Two times a day (BID) | OPHTHALMIC | Status: DC
  Administered 2019-01-27 – 2019-01-29 (×5): 1 [drp] via OPHTHALMIC
  Filled 2019-01-27 (×6): qty 1

## 2019-01-27 MED ORDER — ACETAMINOPHEN 500 MG PO TABS
500.0000 mg | ORAL_TABLET | Freq: Four times a day (QID) | ORAL | Status: DC | PRN
  Administered 2019-01-27 – 2019-01-29 (×3): 500 mg via ORAL
  Filled 2019-01-27 (×3): qty 1

## 2019-01-27 MED ORDER — LIDOCAINE 5 % EX PTCH
1.0000 | MEDICATED_PATCH | CUTANEOUS | Status: DC
  Administered 2019-01-27 – 2019-01-29 (×3): 1 via TRANSDERMAL
  Filled 2019-01-27 (×3): qty 1

## 2019-01-27 MED ORDER — HYDROXYZINE HCL 25 MG PO TABS
50.0000 mg | ORAL_TABLET | Freq: Three times a day (TID) | ORAL | Status: DC
  Administered 2019-01-27 – 2019-01-29 (×7): 50 mg via ORAL
  Filled 2019-01-27 (×7): qty 2

## 2019-01-27 MED ORDER — SODIUM CHLORIDE 0.9 % IV BOLUS
1000.0000 mL | Freq: Once | INTRAVENOUS | Status: AC
  Administered 2019-01-27: 1000 mL via INTRAVENOUS

## 2019-01-27 MED ORDER — INSULIN ASPART 100 UNIT/ML ~~LOC~~ SOLN
0.0000 [IU] | SUBCUTANEOUS | Status: DC
  Administered 2019-01-27 – 2019-01-28 (×2): 3 [IU] via SUBCUTANEOUS
  Administered 2019-01-28 (×2): 5 [IU] via SUBCUTANEOUS
  Administered 2019-01-28: 3 [IU] via SUBCUTANEOUS
  Administered 2019-01-28: 7 [IU] via SUBCUTANEOUS
  Administered 2019-01-28 – 2019-01-29 (×2): 3 [IU] via SUBCUTANEOUS
  Administered 2019-01-29: 5 [IU] via SUBCUTANEOUS

## 2019-01-27 MED ORDER — QUETIAPINE FUMARATE 50 MG PO TABS
400.0000 mg | ORAL_TABLET | Freq: Every day | ORAL | Status: DC
  Administered 2019-01-28 (×2): 400 mg via ORAL
  Filled 2019-01-27 (×2): qty 8

## 2019-01-27 MED ORDER — INSULIN REGULAR(HUMAN) IN NACL 100-0.9 UT/100ML-% IV SOLN
INTRAVENOUS | Status: DC
  Administered 2019-01-27: 3.4 [IU]/h via INTRAVENOUS
  Administered 2019-01-27: 2.6 [IU]/h via INTRAVENOUS
  Administered 2019-01-27: 3.2 [IU]/h via INTRAVENOUS
  Administered 2019-01-27: 6.5 [IU]/h via INTRAVENOUS
  Administered 2019-01-27: 0.9 [IU]/h via INTRAVENOUS
  Filled 2019-01-27 (×2): qty 100

## 2019-01-27 MED ORDER — PROMETHAZINE HCL 25 MG/ML IJ SOLN: 12.5000 mg | Freq: Four times a day (QID) | INTRAMUSCULAR | Status: DC | PRN

## 2019-01-27 MED ORDER — ONDANSETRON HCL 4 MG/2ML IJ SOLN
4.0000 mg | Freq: Four times a day (QID) | INTRAMUSCULAR | Status: DC | PRN
  Administered 2019-01-27 – 2019-01-28 (×2): 4 mg via INTRAVENOUS
  Filled 2019-01-27 (×2): qty 2

## 2019-01-27 MED ORDER — LAMOTRIGINE 25 MG PO TABS
50.0000 mg | ORAL_TABLET | Freq: Two times a day (BID) | ORAL | Status: DC
  Administered 2019-01-27 – 2019-01-29 (×5): 50 mg via ORAL
  Filled 2019-01-27 (×6): qty 2

## 2019-01-27 MED ORDER — METOCLOPRAMIDE HCL 5 MG/ML IJ SOLN
10.0000 mg | Freq: Four times a day (QID) | INTRAMUSCULAR | Status: DC
  Administered 2019-01-28 – 2019-01-29 (×6): 10 mg via INTRAVENOUS
  Filled 2019-01-27 (×6): qty 2

## 2019-01-27 MED ORDER — ATORVASTATIN CALCIUM 10 MG PO TABS
10.0000 mg | ORAL_TABLET | Freq: Two times a day (BID) | ORAL | Status: DC
  Administered 2019-01-27 – 2019-01-29 (×5): 10 mg via ORAL
  Filled 2019-01-27 (×5): qty 1

## 2019-01-27 MED ORDER — ALUM & MAG HYDROXIDE-SIMETH 200-200-20 MG/5ML PO SUSP
15.0000 mL | ORAL | Status: DC | PRN
  Administered 2019-01-27: 15 mL via ORAL
  Filled 2019-01-27: qty 30

## 2019-01-27 NOTE — Progress Notes (Signed)
LTM maint complete - no skin breakdown under:   

## 2019-01-27 NOTE — Progress Notes (Signed)
Inpatient Diabetes Program Recommendations  AACE/ADA: New Consensus Statement on Inpatient Glycemic Control (2015)  Target Ranges:  Prepandial:   less than 140 mg/dL      Peak postprandial:   less than 180 mg/dL (1-2 hours)      Critically ill patients:  140 - 180 mg/dL   Lab Results  Component Value Date   GLUCAP 119 (H) 01/27/2019   HGBA1C 11.3 (H) 12/19/2018    Review of Glycemic Control  Diabetes history: DM ?2 Outpatient Diabetes medications: Lantus 60 units + Humalog 15 units bid meal coverage if eats 50% Current orders for Inpatient glycemic control: IV insulin  Inpatient Diabetes Program Recommendations:   When patient ready to transition off of IV insulin: -Give basal insulin 2 hrs prior to D/C of insulin drip -Cover CBG with Novolog correction scale @ time insulin drip discontinued Lantus 48 units would = 80 % home basal dose which patient required last admission. Add Novolog meal coverage when eating Sensitive correction q 4 hrs while NPO  Thank you, Darel Hong E. Rileyann Florance, RN, MSN, CDE  Diabetes Coordinator Inpatient Glycemic Control Team Team Pager [phone] (8am-5pm) 01/27/2019 2:01 PM

## 2019-01-27 NOTE — Progress Notes (Signed)
2000- CBG 171- Endotool recommends to transition to SQ insulin- paged [facility] on call with this information

## 2019-01-27 NOTE — ED Provider Notes (Signed)
[facility] EMERGENCY DEPARTMENT Provider Note   CSN: [id number] Arrival date & time: 01/26/19  1815     History Chief Complaint  Patient presents with  . Emesis  . Seizures    Nancy [name] is a 19 y.o. female.  The history is provided by the patient and medical records.  Emesis Associated symptoms: abdominal pain (cramping)   Seizures  19 year old female with history of anxiety, CHF, depression, type 1 diabetes, ovarian cyst, seizures/pseudoseizures, presenting to the ED with nausea and vomiting.  She reports she began feeling bad a few days ago but over the past 24 hours has significantly worsened.  She reports over 20 episodes of nonbloody, nonbilious emesis in the last 24 hours.  Reports now she is at the point of dry heaves.  She does have some abdominal cramping, states it feels like if she were able to vomit she would feel better.  She denies any diarrhea.  No fever or chills.  She has had wet cough with clear phlegm and lost her sense of taste and smell 2 days ago.  She lives with mother who had a positive Covid exposure a few days ago.  States mother remains asymptomatic currently.  States overall she feels very fatigued and rundown.  She is not had any medications prior to arrival.  Past Medical History:  Diagnosis Date  . Acanthosis nigricans   . Anxiety   . CHF (congestive heart failure) (HCC)   . Chronic lower back pain   . Depression   . DKA, type 1 (HCC) 09/13/2018  . Dyspepsia   . Obesity   . Ovarian cyst    pt is not aware of this hx (11/24/2017)  . 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 Premature baby   . Seizures (HCC)   . Type II diabetes mellitus (HCC)    insulin dependant    Patient Active Problem List   Diagnosis Date Noted  . Leukocytosis 12/19/2018  . Dehydration   . Pseudoseizures   . Uncontrolled diabetes mellitus (HCC) 11/28/2018  . Uncontrolled diabetes mellitus with hyperglycemia (HCC) 11/27/2018  . Severe recurrent major  depression without psychotic features (HCC) 11/08/2018  . MDD (major depressive disorder), recurrent episode, severe (HCC) 11/06/2018  . Nonspecific abnormal electrocardiogram (ECG) (EKG)   . Chest pain of uncertain etiology   . Hypertensive urgency 10/08/2018  . Conversion disorder with attacks or seizures, acute episode, with psychological stressor 09/16/2018  . MDD (major depressive disorder), recurrent severe, without psychosis (HCC)   . AKI (acute kidney injury) (HCC) 08/04/2018  . Seizures (HCC) 08/03/2018  . Depression 07/25/2018  . Syncope 01/30/2018  . Orthostatic hypotension 01/24/2018  . Tachycardia 12/28/2017  . Chronic abdominal pain 12/24/2017  . Chest pain 12/19/2017  . Nausea and vomiting 08/21/2017  . Generalized abdominal pain 08/21/2017  . Non compliance with medical treatment 01/27/2012  . Adjustment disorder 09/16/2011  . Acanthosis nigricans   . Goiter   . Obesity 06/14/2010  . Hypertension 06/14/2010    Past Surgical History:  Procedure Laterality Date  . ABDOMINAL HERNIA REPAIR     "I was a baby"  . BIOPSY  10/12/2018   Procedure: BIOPSY;  Surgeon: Lynann Bologna, MD;  Location: [facility] ENDOSCOPY;  Service: Endoscopy;;  . ESOPHAGOGASTRODUODENOSCOPY (EGD) WITH PROPOFOL N/A 10/12/2018   Procedure: ESOPHAGOGASTRODUODENOSCOPY (EGD) WITH PROPOFOL;  Surgeon: Lynann Bologna, MD;  Location: [facility] ENDOSCOPY;  Service: Endoscopy;  Laterality: N/A;  . HERNIA REPAIR    . LEFT HEART CATH AND  CORONARY ANGIOGRAPHY N/A 10/13/2018   Procedure: LEFT HEART CATH AND CORONARY ANGIOGRAPHY;  Surgeon: Kathleene Hazel, MD;  Location: [facility] INVASIVE CV LAB;  Service: Cardiovascular;  Laterality: N/A;  . TONSILLECTOMY AND ADENOIDECTOMY    . WISDOM TOOTH EXTRACTION  2017     OB History   No obstetric history on file.     Family History  Problem Relation Age of Onset  . Diabetes Mother   . Hypertension Mother   . Obesity Mother   . Asthma Mother   . Allergic rhinitis Mother   .  Eczema Mother   . Cervical cancer Mother   . Diabetes Father   . Hypertension Father   . Obesity Father   . Hyperlipidemia Father   . Hypertension Paternal Aunt   . Hypertension Maternal Grandfather   . Colon cancer Maternal Grandfather   . Diabetes Paternal Grandmother   . Obesity Paternal Grandmother   . Diabetes Paternal Grandfather   . Obesity Paternal Grandfather   . Angioedema Neg Hx   . Immunodeficiency Neg Hx   . Urticaria Neg Hx   . Stomach cancer Neg Hx   . Esophageal cancer Neg Hx     Social History   Tobacco Use  . Smoking status: Never Smoker  . Smokeless tobacco: Never Used  Substance Use Topics  . Alcohol use: No    Alcohol/week: 0.0 standard drinks  . Drug use: No    Home Medications Prior to Admission medications   Medication Sig Start Date End Date Taking? Authorizing Provider  atorvastatin (LIPITOR) 20 MG tablet Take 1 tablet (20 mg total) by mouth daily at 6 PM. Patient taking differently: Take 10 mg by mouth 2 (two) times daily.  10/05/18   Storm Frisk, MD  bisacodyl (DULCOLAX) 10 MG suppository Place 1 suppository (10 mg total) rectally daily. 12/24/18   Swayze, Ava, DO  busPIRone (BUSPAR) 10 MG tablet Take 10 mg by mouth daily.  11/19/18   [provider]  cycloSPORINE (RESTASIS) 0.05 % ophthalmic emulsion Place 1 drop into both eyes 2 (two) times daily.    [provider]  hydrOXYzine (ATARAX/VISTARIL) 50 MG tablet Take 1 tablet (50 mg total) by mouth 3 (three) times daily as needed for anxiety. Patient taking differently: Take 50 mg by mouth 3 (three) times daily.  11/11/18   Armandina Stammer I, NP  insulin glargine (LANTUS) 100 UNIT/ML injection Inject 0.6 mLs (60 Units total) into the skin at bedtime. For diabetes management 11/11/18   Armandina Stammer I, NP  insulin lispro (HUMALOG) 100 UNIT/ML injection Inject 0.15 mLs (15 Units total) into the skin 2 (two) times daily with a meal. Patient taking differently: Inject 15 Units into  the skin See admin instructions. Inject 15 units subcutaneously up to twice daily with meals 10/05/18   Storm Frisk, MD  lactulose (CHRONULAC) 10 GM/15ML solution Take 30 mLs (20 g total) by mouth 3 (three) times daily. 12/23/18   Swayze, Ava, DO  lamoTRIgine (LAMICTAL) 25 MG tablet Take 2 tablets (50 mg total) by mouth 2 (two) times daily. For mood stabilization 11/11/18   Armandina Stammer I, NP  lubiprostone (AMITIZA) 24 MCG capsule Take 1 capsule (24 mcg total) by mouth 2 (two) times daily with a meal. Patient taking differently: Take 24 mcg by mouth daily with breakfast.  10/27/18   Esterwood, Amy S, PA-C  meloxicam (MOBIC) 7.5 MG tablet Take 7.5 mg by mouth daily with lunch.     [provider]  metoCLOPramide (REGLAN) 10 MG tablet Take 1 tablet (10 mg total) by mouth 4 (four) times daily -  before meals and at bedtime. Patient taking differently: Take 10 mg by mouth daily before breakfast.  11/30/18 12/30/18  Jerald Kief, MD  mirtazapine (REMERON) 7.5 MG tablet Take 1 tablet (7.5 mg total) by mouth at bedtime. For depression/sleep 11/11/18   Armandina Stammer I, NP  Multiple Vitamin (MULTIVITAMIN WITH MINERALS) TABS tablet Take 1 tablet by mouth daily. Patient not taking: Reported on 12/19/2018 09/18/18   Clapacs, Jackquline Denmark, MD  Olopatadine HCl (PAZEO) 0.7 % SOLN Place 2 drops into both eyes 2 (two) times daily.     [provider]  pantoprazole (PROTONIX) 40 MG tablet Take 1 tablet (40 mg total) by mouth 2 (two) times daily. For acid reflux Patient not taking: Reported on 12/19/2018 11/11/18   Armandina Stammer I, NP  polyethylene glycol (MIRALAX / GLYCOLAX) 17 g packet Take 17 g by mouth daily. 12/24/18   Swayze, Ava, DO  prochlorperazine (COMPAZINE) 25 MG suppository Place 1 suppository (25 mg total) rectally every 12 (twelve) hours as needed for nausea or vomiting. 12/05/18   Melene Plan, DO  promethazine (PHENERGAN) 25 MG suppository Place 25 mg rectally every 8 (eight) hours as  needed for nausea or vomiting.    [provider]  propranolol (INDERAL) 20 MG tablet Take 1 tablet (20 mg total) by mouth 2 (two) times daily. For anxiety/HTN 12/23/18   Swayze, Ava, DO  QUEtiapine (SEROQUEL) 200 MG tablet Take 2 tablets (400 mg total) by mouth at bedtime. 01/15/19   Gwyneth Sprout, MD  topiramate (TOPAMAX) 25 MG tablet Take 1 tablet (25 mg total) by mouth 2 (two) times daily. For seizure activities 11/11/18   Armandina Stammer I, NP    Allergies    Ibuprofen and Oatmeal  Review of Systems   Review of Systems  Gastrointestinal: Positive for abdominal pain (cramping), nausea and vomiting.  Neurological: Positive for seizures.  All other systems reviewed and are negative.   Physical Exam Updated Vital Signs BP 125/85 (BP Location: Right Arm)   Pulse (!) 133   Temp 97.8 F (36.6 C) (Oral)   Resp (!) 26   LMP 01/08/2019   SpO2 100%   Physical Exam Vitals and nursing note reviewed.  Constitutional:      Appearance: She is well-developed.  HENT:     Head: Normocephalic and atraumatic.  Eyes:     Conjunctiva/sclera: Conjunctivae normal.     Pupils: Pupils are equal, round, and reactive to light.  Cardiovascular:     Rate and Rhythm: Regular rhythm. Tachycardia present.     Heart sounds: Normal heart sounds.     Comments: Sinus tachy in 130's during exam Pulmonary:     Effort: Pulmonary effort is normal.     Breath sounds: Normal breath sounds.  Abdominal:     General: Bowel sounds are normal.     Palpations: Abdomen is soft.  Musculoskeletal:        General: Normal range of motion.     Cervical back: Normal range of motion.  Skin:    General: Skin is warm and dry.  Neurological:     Mental Status: She is alert and oriented to person, place, and time.     ED Results / Procedures / Treatments   Labs (all labs ordered are listed, but only abnormal results are displayed) Labs Reviewed  COMPREHENSIVE METABOLIC PANEL - Abnormal; Notable for the  following components:      Result Value   CO2 18 (*)    Glucose, Bld 410 (*)    Anion gap 17 (*)    All other components within normal limits  CBC - Abnormal; Notable for the following components:   WBC 10.9 (*)    RBC 5.26 (*)    All other components within normal limits  URINALYSIS, ROUTINE W REFLEX MICROSCOPIC - Abnormal; Notable for the following components:   APPearance HAZY (*)    Specific Gravity, Urine 1.032 (*)    Glucose, UA >=500 (*)    Hgb urine dipstick LARGE (*)    Ketones, ur 80 (*)    Protein, ur 30 (*)    Bacteria, UA RARE (*)    All other components within normal limits  POCT I-STAT EG7 - Abnormal; Notable for the following components:   pCO2, Ven 38.2 (*)    pO2, Ven 31.0 (*)    Bicarbonate 18.8 (*)    TCO2 20 (*)    Acid-base deficit 7.0 (*)    Sodium 133 (*)    Hemoglobin 15.6 (*)    All other components within normal limits  CBG MONITORING, ED - Abnormal; Notable for the following components:   Glucose-Capillary 267 (*)    All other components within normal limits  RESPIRATORY PANEL BY RT PCR (FLU A&B, COVID)  LIPASE, BLOOD  LACTIC ACID, PLASMA  CBC  BASIC METABOLIC PANEL  BASIC METABOLIC PANEL  BASIC METABOLIC PANEL  BASIC METABOLIC PANEL  BASIC METABOLIC PANEL  BETA-HYDROXYBUTYRIC ACID  BETA-HYDROXYBUTYRIC ACID  BETA-HYDROXYBUTYRIC ACID  I-STAT BETA HCG BLOOD, ED ([facility], [facility], [facility] ONLY)  POC SARS CORONAVIRUS 2 AG -  ED  POC SARS CORONAVIRUS 2 AG -  ED    EKG None  Radiology DG Chest Port 1 View  Result Date: 01/27/2019 CLINICAL DATA:  Cough. EXAM: PORTABLE CHEST 1 VIEW COMPARISON:  December 19, 2018 FINDINGS: The heart size and mediastinal contours are within normal limits. Both lungs are clear. The visualized skeletal structures are unremarkable. IMPRESSION: No active disease. Electronically Signed   By: Constance Holster M.D.   On: 01/27/2019 01:13    Procedures Procedures (including critical care time)  CRITICAL CARE Performed by:  Larene Pickett   Total critical care time: 45 minutes  Critical care time was exclusive of separately billable procedures and treating other patients.  Critical care was necessary to treat or prevent imminent or life-threatening deterioration.  Critical care was time spent personally by me on the following activities: development of treatment plan with patient and/or surrogate as well as nursing, discussions with consultants, evaluation of patient's response to treatment, examination of patient, obtaining history from patient or surrogate, ordering and performing treatments and interventions, ordering and review of laboratory studies, ordering and review of radiographic studies, pulse oximetry and re-evaluation of patient's condition.   Medications Ordered in ED Medications  atorvastatin (LIPITOR) tablet 10 mg (has no administration in time range)  busPIRone (BUSPAR) tablet 10 mg (has no administration in time range)  hydrOXYzine (ATARAX/VISTARIL) tablet 50 mg (has no administration in time range)  mirtazapine (REMERON) tablet 7.5 mg (has no administration in time range)  QUEtiapine (SEROQUEL) tablet 400 mg (has no administration in time range)  lamoTRIgine (LAMICTAL) tablet 50 mg (has no administration in time range)  topiramate (TOPAMAX) tablet 25 mg (has no administration in time range)  cycloSPORINE (RESTASIS) 0.05 % ophthalmic emulsion 1 drop (has no administration in time range)  Olopatadine HCl  0.7 % SOLN 2 drop (has no administration in time range)  enoxaparin (LOVENOX) injection 40 mg (has no administration in time range)  insulin regular, human (MYXREDLIN) 100 units/ 100 mL infusion (has no administration in time range)  0.9 %  sodium chloride infusion (has no administration in time range)  dextrose 5 %-0.45 % sodium chloride infusion (has no administration in time range)  dextrose 50 % solution 0-50 mL (has no administration in time range)  sodium chloride 0.9 % bolus 1,000 mL  (0 mLs Intravenous Stopped 01/27/19 0203)  metoCLOPramide (REGLAN) injection 10 mg (10 mg Intravenous Given 01/27/19 0053)  pantoprazole (PROTONIX) injection 40 mg (40 mg Intravenous Given 01/27/19 0053)  sodium chloride 0.9 % bolus 1,000 mL (1,000 mLs Intravenous New Bag/Given 01/27/19 0416)    ED Course  I have reviewed the triage vital signs and the nursing notes.  Pertinent labs & imaging results that were available during my care of the patient were reviewed by me and considered in my medical decision making (see chart for details).    MDM Rules/Calculators/A&P  19 year old female here with 20+ episodes of vomiting over the past 24 hours.  Has history of type 1 diabetes, appears poorly controlled.  Has also had some Covid symptoms including cough, loss of taste and smell after a exposure a few days ago.  She is afebrile and nontoxic in appearance.  She is tachycardic and clinically dry appearing. Labs obtained from triage with mildly low bicarb of 18 and mildly elevated anion gap of 17.  VBG was obtained with normal pH but does have acid base deficit of 7.  Will give IVFB, antiemetics, protonix.  Awaiting COVID screen.  Covid and flu screen is negative.  After fluids on reassessment, she is still tachycardic in the 140s.  Blood pressures are mildly low in the 90s systolic.  At this point, seems she is trending towards DKA.  I discussed this with her, states this is how she usually feels when this happens.  Feel she will require admission for ongoing IV hydration and symptomatic control.  She is agreeable.  Given additional bolus and starting glucostabilizer.  No further vomiting at this time, given water.  Discussed with hospitalist, Dr. Toniann Fail-- will admit for ongoing care.  Final Clinical Impression(s) / ED Diagnoses Final diagnoses:  Type 1 diabetes mellitus with ketoacidosis without coma (HCC)  Non-intractable vomiting with nausea, unspecified vomiting type    Rx / DC Orders ED  Discharge Orders    None       Garlon Hatchet, PA-C 01/27/19 0506    Gilda Crease, MD 01/27/19 [phone]

## 2019-01-27 NOTE — Procedures (Signed)
Patient Name: Nancy [name]  MRN: [id number]  Epilepsy Attending: Charlsie Quest  Referring Physician/Provider: Dr. Lindie Spruce Date: 01/27/2019 Duration: 23.46 minutes  Patient history: 19 year old female with whole-body shaking seizure-like episodes.  EEG evaluate for seizures.  Level of alertness: Awake  AEDs during EEG study: Topamax, lamotrigine  Technical aspects: This EEG study was done with scalp electrodes positioned according to the 10-20 International system of electrode placement. Electrical activity was acquired at a sampling rate of 500Hz  and reviewed with a high frequency filter of 70Hz  and a low frequency filter of 1Hz . EEG data were recorded continuously and digitally stored.   Description: The posterior dominant rhythm consists of 8-9 Hz activity of moderate voltage (25-35 uV) seen predominantly in posterior head regions, symmetric and reactive to eye opening and eye closing.  Hyperventilation and photic stimulation were not performed.  IMPRESSION: This study is within normal limits. No seizures or epileptiform discharges were seen throughout the recording.  [name] 

## 2019-01-27 NOTE — Progress Notes (Signed)
This is a 19 year old female with a history of DM1, seizures vs. pseudoseizures, multiple hospitalizations, depression, anxiety who who was admitted earlier this morning by Dr. Toniann Fail for DKA after having reported at least 8 seizures over the past 24 hours at home witnessed by mom. Also found to have close COVID 19 contact exposure but negative rapid test in ED. She has since been started on DKA protocol and diabetic coordinator has been consulted.   For her history of seizures on Topamax and Lamictal, neurology was consulted by admitting physician for further evaluation. Patient reported that her Lamictal was increased 2 months ago.   Currently: complains of localized left sided chest pain which occurs at rest and on exertion at home and here intermittently. No improvement with tylenol. No radiation. Does not worsen with inspiration or cough. Worsens with position. Also with generalized abdominal pain  General: AAO x 3, no acute distress on room air Heart: no stethoscope in covid PUI room. Telemetry in sinus tachycardia Lungs: not auscultated. No audible wheeze, congestion/cough MSK: tenderness to palpation over left chest, no swelling of lower extremities  A/P 1. DKA - continue DKA protocol, follow up with Diabetic coordinator 2. Intractable Nausea/Vomiting - gastroparesis vs. Gastroenteritis, improving. 3. Abdominal pain likely from nausea/vomiting/DKA - maalox, eat slowly 4. Chest pain - EKG without pathologic ST changes, negative troponin. Currently tachycardic. Likely musculoskeletal. Lidoderm patch, telemetry  5. Seizures - on Lamictal (recently increased) and Topamax. neurology consulted, Long Term EEG 6. Depression/Anxiety - on Buspar and Seroquel 7. COVID 19 Exposure - rapid test negative, follow up confirmatory testing 8. Hypertension - holding beta blocker  Jae Dire, DO

## 2019-01-27 NOTE — ED Notes (Signed)
Admitting paged to RN per his request  

## 2019-01-27 NOTE — Progress Notes (Signed)
EEG complete - results pending 

## 2019-01-27 NOTE — H&P (Addendum)
History and Physical    Nancy [name] [name] DOB: 06/02/2000 DOA: 01/26/2019  PCP: Nolene Ebbs, MD  Patient coming from: Home.  Chief Complaint: Nausea vomiting.  Seizures.  HPI: Nancy [name] is a 19 y.o. female with history of diabetes mellitus type 1, seizures, depression and anxiety presents to the ER with complaints of having persistent nausea vomiting for the last 2 weeks unable to keep anything.  Also patient states that she had at least 8 seizures over the last 24 hours at home witnessed by her mom.  After the seizure patient had urinated on herself and lost consciousness briefly.  When she vomits she gets some chest discomfort otherwise denies any abdominal pain or diarrhea.  Denies any fever chills.  Did come in contact with Covid positive patient about 4 days ago.  ED Course: In the ER patient was Covid test negative chest x-ray unremarkable.  EKG shows sinus tachycardia patient was hypotensive was given fluid bolus.  Appears nonfocal.  UA shows ketones.  Labs show blood sugar of 410 bicarb 18 anion gap 17 WBC of 10.9.  Lactic acid 1.3.  Patient is admitted for diabetic ketoacidosis with intractable nausea vomiting.  On exam patient abdomen appears benign.  Patient appears nonfocal.  Review of Systems: As per HPI, rest all negative.   Past Medical History:  Diagnosis Date  . Acanthosis nigricans   . Anxiety   . CHF (congestive heart failure) ([place])   . Chronic lower back pain   . Depression   . DKA, type 1 ([place]) 09/13/2018  . Dyspepsia   . Obesity   . Ovarian cyst    pt is not aware of this hx (11/24/2017)  . 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 Premature baby   . Seizures ([place])   . Type II diabetes mellitus (HCC)    insulin dependant    Past Surgical History:  Procedure Laterality Date  . ABDOMINAL HERNIA REPAIR     "I was a baby"  . BIOPSY  10/12/2018   Procedure: BIOPSY;  Surgeon: Jackquline Denmark, MD;  Location: [facility] ENDOSCOPY;  Service: Endoscopy;;  .  ESOPHAGOGASTRODUODENOSCOPY (EGD) WITH PROPOFOL N/A 10/12/2018   Procedure: ESOPHAGOGASTRODUODENOSCOPY (EGD) WITH PROPOFOL;  Surgeon: Jackquline Denmark, MD;  Location[facility] ENDOSCOPY;  Service: Endoscopy;  Laterality: N/A;  . HERNIA REPAIR    . LEFT HEART CATH AND CORONARY ANGIOGRAPHY N/A 10/13/2018   Procedure: LEFT HEART CATH AND CORONARY ANGIOGRAPHY;  Surgeon: Burnell Blanks, MD;  Location: [facility];  Service: Cardiovascular;  Laterality: N/A;  . TONSILLECTOMY AND ADENOIDECTOMY    . WISDOM TOOTH EXTRACTION  2017     reports that she has never smoked. She has never used smokeless tobacco. She reports that she does not drink alcohol or use drugs.  Allergies  Allergen Reactions  . Ibuprofen Other (See Comments)    GI MD said to not take this anymore  . Oatmeal Rash    Family History  Problem Relation Age of Onset  . Diabetes Mother   . Hypertension Mother   . Obesity Mother   . Asthma Mother   . Allergic rhinitis Mother   . Eczema Mother   . Cervical cancer Mother   . Diabetes Father   . Hypertension Father   . Obesity Father   . Hyperlipidemia Father   . Hypertension Paternal Aunt   . Hypertension Maternal Grandfather   . Colon cancer Maternal Grandfather   . Diabetes Paternal Grandmother   .  Obesity Paternal Grandmother   . Diabetes Paternal Grandfather   . Obesity Paternal Grandfather   . Angioedema Neg Hx   . Immunodeficiency Neg Hx   . Urticaria Neg Hx   . Stomach cancer Neg Hx   . Esophageal cancer Neg Hx     Prior to Admission medications   Medication Sig Start Date End Date Taking? Authorizing Provider  atorvastatin (LIPITOR) 20 MG tablet Take 1 tablet (20 mg total) by mouth daily at 6 PM. Patient taking differently: Take 10 mg by mouth 2 (two) times daily.  10/05/18  Yes Storm Frisk, MD  bisacodyl (DULCOLAX) 10 MG suppository Place 1 suppository (10 mg total) rectally daily. 12/24/18  Yes Swayze, Ava, DO  busPIRone (BUSPAR) 10 MG tablet Take  10 mg by mouth 2 (two) times daily.  11/19/18  Yes [provider]  cycloSPORINE (RESTASIS) 0.05 % ophthalmic emulsion Place 1 drop into both eyes 2 (two) times daily.   Yes [provider]  hydrOXYzine (ATARAX/VISTARIL) 50 MG tablet Take 1 tablet (50 mg total) by mouth 3 (three) times daily as needed for anxiety. Patient taking differently: Take 50 mg by mouth 3 (three) times daily.  11/11/18  Yes Armandina Stammer I, NP  insulin glargine (LANTUS) 100 UNIT/ML injection Inject 0.6 mLs (60 Units total) into the skin at bedtime. For diabetes management Patient taking differently: Inject 70 Units into the skin at bedtime. For diabetes management 11/11/18  Yes Armandina Stammer I, NP  insulin lispro (HUMALOG) 100 UNIT/ML injection Inject 0.15 mLs (15 Units total) into the skin 2 (two) times daily with a meal. Patient taking differently: Inject 15 Units into the skin See admin instructions. up to twice daily with meals 10/05/18  Yes Storm Frisk, MD  lactulose (CHRONULAC) 10 GM/15ML solution Take 30 mLs (20 g total) by mouth 3 (three) times daily. 12/23/18  Yes Swayze, Ava, DO  lamoTRIgine (LAMICTAL) 25 MG tablet Take 2 tablets (50 mg total) by mouth 2 (two) times daily. For mood stabilization 11/11/18  Yes Nwoko, Nicole Kindred I, NP  lubiprostone (AMITIZA) 24 MCG capsule Take 1 capsule (24 mcg total) by mouth 2 (two) times daily with a meal. Patient taking differently: Take 24 mcg by mouth daily with breakfast.  10/27/18  Yes Esterwood, Amy S, PA-C  meloxicam (MOBIC) 7.5 MG tablet Take 7.5 mg by mouth daily as needed for pain.    Yes [provider]  metoCLOPramide (REGLAN) 10 MG tablet Take 1 tablet (10 mg total) by mouth 4 (four) times daily -  before meals and at bedtime. Patient taking differently: Take 10 mg by mouth every 6 (six) hours as needed for nausea or vomiting.  11/30/18 01/27/28 Yes Jerald Kief, MD  mirtazapine (REMERON) 7.5 MG tablet Take 1 tablet (7.5 mg total) by mouth at  bedtime. For depression/sleep 11/11/18  Yes Armandina Stammer I, NP  Olopatadine HCl (PAZEO) 0.7 % SOLN Place 2 drops into both eyes 2 (two) times daily.    Yes [provider]  polyethylene glycol (MIRALAX / GLYCOLAX) 17 g packet Take 17 g by mouth daily. 12/24/18  Yes Swayze, Ava, DO  prochlorperazine (COMPAZINE) 25 MG suppository Place 1 suppository (25 mg total) rectally every 12 (twelve) hours as needed for nausea or vomiting. 12/05/18  Yes Melene Plan, DO  promethazine (PHENERGAN) 25 MG suppository Place 25 mg rectally every 8 (eight) hours as needed for nausea or vomiting.   Yes [provider]  propranolol (INDERAL) 20 MG tablet  Take 1 tablet (20 mg total) by mouth 2 (two) times daily. For anxiety/HTN 12/23/18  Yes Swayze, Ava, DO  QUEtiapine (SEROQUEL) 200 MG tablet Take 2 tablets (400 mg total) by mouth at bedtime. 01/15/19  Yes Gwyneth Sprout, MD  topiramate (TOPAMAX) 25 MG tablet Take 1 tablet (25 mg total) by mouth 2 (two) times daily. For seizure activities 11/11/18  Yes Armandina Stammer I, NP  Multiple Vitamin (MULTIVITAMIN WITH MINERALS) TABS tablet Take 1 tablet by mouth daily. Patient not taking: Reported on 12/19/2018 09/18/18   Clapacs, Jackquline Denmark, MD  pantoprazole (PROTONIX) 40 MG tablet Take 1 tablet (40 mg total) by mouth 2 (two) times daily. For acid reflux Patient not taking: Reported on 12/19/2018 11/11/18   Sanjuana Kava, NP    Physical Exam: Constitutional: Moderately built and nourished. Vitals:   01/27/19 0130 01/27/19 0200 01/27/19 0230 01/27/19 0300  BP: 119/85 105/63 (!) 98/58 109/75  Pulse: (!) 126 (!) 120 (!) 120   Resp: (!) 23 (!) 21 (!) 21 15  Temp:      TempSrc:      SpO2: 100% 100% 100%    Eyes: Anicteric no pallor. ENMT: No discharge from the ears eyes nose or mouth. Neck: No mass felt.  No neck rigidity. Respiratory: No rhonchi or crepitations. Cardiovascular: S1-S2 heard. Abdomen: Soft nontender bowel sounds present. Musculoskeletal: No  edema.  No joint effusion. Skin: No rash. Neurologic: Alert awake oriented to time place and person.  Moves all extremities. Psychiatric: Appears normal.   Labs on Admission: I have personally reviewed following labs and imaging studies  CBC: Recent Labs  Lab 01/26/19 1830 01/27/19 0107  WBC 10.9*  --   HGB 13.9 15.6*  HCT 43.0 46.0  MCV 81.7  --   PLT 322  --    Basic Metabolic Panel: Recent Labs  Lab 01/26/19 1830 01/27/19 0107  NA 136 133*  K 4.2 4.2  CL 101  --   CO2 18*  --   GLUCOSE 410*  --   BUN 9  --   CREATININE 0.65  --   CALCIUM 9.2  --    GFR: Estimated Creatinine Clearance: 118.3 mL/min (by C-G formula based on SCr of 0.65 mg/dL). Liver Function Tests: Recent Labs  Lab 01/26/19 1830  AST 15  ALT 18  ALKPHOS 91  BILITOT 0.8  PROT 7.4  ALBUMIN 4.1   Recent Labs  Lab 01/26/19 1830  LIPASE 18   No results for input(s): AMMONIA in the last 168 hours. Coagulation Profile: No results for input(s): INR, PROTIME in the last 168 hours. Cardiac Enzymes: No results for input(s): CKTOTAL, CKMB, CKMBINDEX, TROPONINI in the last 168 hours. BNP (last 3 results) No results for input(s): PROBNP in the last 8760 hours. HbA1C: No results for input(s): HGBA1C in the last 72 hours. CBG: Recent Labs  Lab 01/27/19 0410  GLUCAP 267*   Lipid Profile: No results for input(s): CHOL, HDL, LDLCALC, TRIG, CHOLHDL, LDLDIRECT in the last 72 hours. Thyroid Function Tests: No results for input(s): TSH, T4TOTAL, FREET4, T3FREE, THYROIDAB in the last 72 hours. Anemia Panel: No results for input(s): VITAMINB12, FOLATE, FERRITIN, TIBC, IRON, RETICCTPCT in the last 72 hours. Urine analysis:    Component Value Date/Time   COLORURINE YELLOW 01/27/2019 0301   APPEARANCEUR HAZY (A) 01/27/2019 0301   LABSPEC 1.032 (H) 01/27/2019 0301   PHURINE 5.0 01/27/2019 0301   GLUCOSEU >=500 (A) 01/27/2019 0301   HGBUR LARGE (A) 01/27/2019 0301  BILIRUBINUR NEGATIVE 01/27/2019  0301   BILIRUBINUR negative 07/13/2018 1651   KETONESUR 80 (A) 01/27/2019 0301   PROTEINUR 30 (A) 01/27/2019 0301   UROBILINOGEN 0.2 07/13/2018 1651   UROBILINOGEN 1.0 07/09/2017 1324   NITRITE NEGATIVE 01/27/2019 0301   LEUKOCYTESUR NEGATIVE 01/27/2019 0301   Sepsis Labs: @LABRCNTIP (procalcitonin:4,lacticidven:4) ) Recent Results (from the past 240 hour(s))  Respiratory Panel by RT PCR (Flu A&B, Covid) - Nasopharyngeal Swab     Status: None   Collection Time: 01/27/19  1:19 AM   Specimen: Nasopharyngeal Swab  Result Value Ref Range Status   SARS Coronavirus 2 by RT PCR NEGATIVE NEGATIVE Final    Comment: (NOTE) SARS-CoV-2 target nucleic acids are NOT DETECTED. The SARS-CoV-2 RNA is generally detectable in upper respiratoy specimens during the acute phase of infection. The lowest concentration of SARS-CoV-2 viral copies this assay can detect is 131 copies/mL. A negative result does not preclude SARS-Cov-2 infection and should not be used as the sole basis for treatment or other patient management decisions. A negative result may occur with  improper specimen collection/handling, submission of specimen other than nasopharyngeal swab, presence of viral mutation(s) within the areas targeted by this assay, and inadequate number of viral copies (<131 copies/mL). A negative result must be combined with clinical observations, patient history, and epidemiological information. The expected result is Negative. Fact Sheet for Patients:  01/29/19 Fact Sheet for Healthcare Providers:  https://www.moore.com/ This test is not yet ap proved or cleared by the https://www.young.biz/ FDA and  has been authorized for detection and/or diagnosis of SARS-CoV-2 by FDA under an Emergency Use Authorization (EUA). This EUA will remain  in effect (meaning this test can be used) for the duration of the COVID-19 declaration under Section 564(b)(1) of the Act, 21  U.S.C. section 360bbb-3(b)(1), unless the authorization is terminated or revoked sooner.    Influenza A by PCR NEGATIVE NEGATIVE Final   Influenza B by PCR NEGATIVE NEGATIVE Final    Comment: (NOTE) The Xpert Xpress SARS-CoV-2/FLU/RSV assay is intended as an aid in  the diagnosis of influenza from Nasopharyngeal swab specimens and  should not be used as a sole basis for treatment. Nasal washings and  aspirates are unacceptable for Xpert Xpress SARS-CoV-2/FLU/RSV  testing. Fact Sheet for Patients: [place] Fact Sheet for Healthcare Providers: https://www.moore.com/ This test is not yet approved or cleared by the https://www.young.biz/ FDA and  has been authorized for detection and/or diagnosis of SARS-CoV-2 by  FDA under an Emergency Use Authorization (EUA). This EUA will remain  in effect (meaning this test can be used) for the duration of the  Covid-19 declaration under Section 564(b)(1) of the Act, 21  U.S.C. section 360bbb-3(b)(1), unless the authorization is  terminated or revoked. Performed at [facility], [address] [place], [place]      Radiological Exams on Admission: DG Chest Port 1 View  Result Date: 01/27/2019 CLINICAL DATA:  Cough. EXAM: PORTABLE CHEST 1 VIEW COMPARISON:  December 19, 2018 FINDINGS: The heart size and mediastinal contours are within normal limits. Both lungs are clear. The visualized skeletal structures are unremarkable. IMPRESSION: No active disease. Electronically Signed   By: December 21, 2018 M.D.   On: 01/27/2019 01:13    EKG: Independently reviewed.  Sinus tachycardia.  Assessment/Plan Principal Problem:   DKA (diabetic ketoacidoses) (HCC) Active Problems:   Nausea and vomiting   Type 1 diabetes mellitus with ketoacidosis without coma (HCC)    1. Diabetic ketoacidosis likely precipitated by intractable  nausea vomiting.  Patient states she has been compliant with her  medication.  Will check hemoglobin A1c patient is on insulin infusion IV fluids follow metabolic panel until anion gap is corrected change to long-acting insulin. 2. Intractable nausea vomiting has improved but abdomen appears benign.  Patient is tolerating clear liquids.  Closely monitor.  Could be from gastroenteritis versus gastroparesis. 3. Seizures with history of seizure disorder on Topamax and Lamictal.  Per patient patient Lamictal was increased 2 months ago.  Neurologist consulted for further recommendations. 4. Depression and anxiety on Seroquel and BuSpar. 5. Covid 19 exposure last 4 days ago.  Presently asymptomatic Covid test was negative.  Closely observe. 6. Hypertension at presentation likely from dehydration.  Holding propranolol for now.  Continue hydration does not look septic.  Not sure why patient is taking propranolol.   DVT prophylaxis: Lovenox. Code Status: Full code. Family Communication: Discussed with patient. Disposition Plan: Home. Consults called: Neurologist. Admission status: Observation.   Eduard Clos MD [facility] Pager [phone].  If 7PM-7AM, please contact night-coverage [email or web address] Password [facility]  01/27/2019, 4:31 AM

## 2019-01-27 NOTE — Consult Note (Signed)
Neurology Consultation Reason for Consult: Seizure Referring Physician: Dr. Marva Panda  CC: Seizure, diabetes  History is obtained from: Patient and chart review  HPI: Nancy [name] is a 19 y.o. female with history of type 1 diabetes, seizure disorder, depression anxiety who presented to the ER yesterday with complaints of persistent nausea and vomiting as well as multiple seizures.  Patient was diagnosed with diabetic ketoacidosis and admitted on the medicine service.  Neurology was consulted for seizure-like episodes.  Seizure history: Patient states she started having seizure-like episodes about 2 years ago.  States her mother has witnessed a lot of her episodes and told her that she has whole body shaking during the episodes.  She denies any warning/aura before the episodes.  Denies any history of tongue bite, bladder or bowel incontinence.  Reports having seizures every other day. States she has been taking Topamax and Lamictal for seizures with no significant change in seizures.   Seizure risk factors: Denies febrile seizures, meningitis/encephalitis, head injury with loss of consciousness, family history of epilepsy.  Of note, patient has had about 20 ED visits since October 2020. Patient has had multiple routine EEGs without any epileptiform abnormality.  She has also had video EEG monitoring in August 2020 during which the events of her body tremoring were recorded without any epileptiform discharges and were most likely nonepileptic.  ROS: A 14 point ROS was performed and is negative except as noted in the HPI.   Past Medical History:  Diagnosis Date  . Acanthosis nigricans   . Anxiety   . CHF (congestive heart failure) ([place])   . Chronic lower back pain   . Depression   . DKA, type 1 ([place]) 09/13/2018  . Dyspepsia   . Obesity   . Ovarian cyst    pt is not aware of this hx (11/24/2017)  . 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 Premature baby   . Seizures ([place])   . Type II diabetes  mellitus (HCC)    insulin dependant    Family History  Problem Relation Age of Onset  . Diabetes Mother   . Hypertension Mother   . Obesity Mother   . Asthma Mother   . Allergic rhinitis Mother   . Eczema Mother   . Cervical cancer Mother   . Diabetes Father   . Hypertension Father   . Obesity Father   . Hyperlipidemia Father   . Hypertension Paternal Aunt   . Hypertension Maternal Grandfather   . Colon cancer Maternal Grandfather   . Diabetes Paternal Grandmother   . Obesity Paternal Grandmother   . Diabetes Paternal Grandfather   . Obesity Paternal Grandfather   . Angioedema Neg Hx   . Immunodeficiency Neg Hx   . Urticaria Neg Hx   . Stomach cancer Neg Hx   . Esophageal cancer Neg Hx    Social History:  reports that she has never smoked. She has never used smokeless tobacco. She reports that she does not drink alcohol or use drugs.   Exam: Current vital signs: BP 105/69 (BP Location: Right Arm)   Pulse (!) 117   Temp (!) 96.8 F (36 C) (Oral)   Resp 19   Wt 88.4 kg   LMP 01/08/2019   SpO2 98%   BMI 34.52 kg/m  Vital signs in last 24 hours: Temp:  [96.8 F (36 C)-98.6 F (37 C)] 96.8 F (36 C) (01/21 0647) Pulse Rate:  [114-145] 117 (01/21 0637) Resp:  [15-26] 19 (01/21 0637) BP: (  98-147)/(50-116) 105/69 (01/21 0637) SpO2:  [98 %-100 %] 98 % (01/21 0639) Weight:  [88.4 kg] 88.4 kg (01/21 0645)   Physical Exam  Constitutional: Appears well-developed and well-nourished.  Psych: Affect appropriate to situation Eyes: No scleral injection HENT: No OP obstrucion Head: Normocephalic.  Cardiovascular: Normal rate and regular rhythm.  Respiratory: Effort normal, non-labored breathing GI: Soft.  No distension. There is no tenderness.  Skin: WDI  Neuro: Mental Status: Patient is awake, alert, oriented to person, place, month, year, and situation. Patient is able to give a clear and coherent history No signs of aphasia or neglect Cranial Nerves: II:  Visual Fields are full. Pupils are equal, round, and reactive to light.  III,IV, VI: EOMI without ptosis or diploplia. V: Facial sensation is symmetric to temperature VII: Facial movement is symmetric.  VIII: hearing is intact to voice X: Uvula elevates symmetrically XI: Shoulder shrug is symmetric. XII: tongue is midline without atrophy or fasciculations.  Motor: Tone is normal. Bulk is normal. 5/5 strength was present in all four extremities.  Sensory: Sensation is symmetric to light touch and temperature in the arms and legs Deep Tendon Reflexes: 2+ and symmetric in the biceps and patellae.  I have reviewed labs in epic and the results pertinent to this consultation are: Blood glucose 14, sodium 136, WBC 10.9  I have reviewed the images obtained: CT head without contrast 01/15/2019: No acute abnormality MRI brain without contrast 12/01/2018: No acute intracranial abnormality. Stable since 2019 and negative non-contrast MRI appearance of the brain aside from suggestion of some perinatal periventricular leukomalacia.  ASSESSMENT/PLAN: 19 year old female admitted with DKA and frequent seizures.  Convulsions DKA -Patient has had previous video EEG during which 2 episodes were captured without EEG change and were consistent with nonepileptic events. -Patient continues to report frequent seizure-like episodes.   Recommendations -Patient has uncontrolled diabetes and although less likely it is possible that hyperglycemia and/or hypoglycemia could provoke some of her seizures.   -We will obtain another video EEG to look for any interictal discharges. -Patient needs better control of her diabetes and close follow-up with psychiatry for management of nonepileptic events. -I will also share some resources regarding management of nonepileptic events with the patient and patient's mother -Continue lamotrigine 50 mg twice daily and Topamax 25 mg twice daily. 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  Neurology will follow.  Please page neuro hospitalist for any further questions after 5 PM.  Lantz Hermann [name]

## 2019-01-28 DIAGNOSIS — R1084 Generalized abdominal pain: Secondary | ICD-10-CM

## 2019-01-28 LAB — CBC
HCT: 37.8 % (ref 36.0–46.0)
Hemoglobin: 12.3 g/dL (ref 12.0–15.0)
MCH: 26.6 pg (ref 26.0–34.0)
MCHC: 32.5 g/dL (ref 30.0–36.0)
MCV: 81.8 fL (ref 80.0–100.0)
Platelets: 338 10*3/uL (ref 150–400)
RBC: 4.62 MIL/uL (ref 3.87–5.11)
RDW: 13.6 % (ref 11.5–15.5)
WBC: 8.2 10*3/uL (ref 4.0–10.5)
nRBC: 0 % (ref 0.0–0.2)

## 2019-01-28 LAB — BASIC METABOLIC PANEL
Anion gap: 9 (ref 5–15)
BUN: 8 mg/dL (ref 6–20)
CO2: 19 mmol/L — ABNORMAL LOW (ref 22–32)
Calcium: 8.3 mg/dL — ABNORMAL LOW (ref 8.9–10.3)
Chloride: 104 mmol/L (ref 98–111)
Creatinine, Ser: 0.53 mg/dL (ref 0.44–1.00)
GFR calc Af Amer: >60 mL/min (ref >60)
GFR calc non Af Amer: >60 mL/min (ref >60)
Glucose, Bld: 237 mg/dL — ABNORMAL HIGH (ref 70–99)
Potassium: 3.3 mmol/L — ABNORMAL LOW (ref 3.5–5.1)
Sodium: 132 mmol/L — ABNORMAL LOW (ref 135–145)

## 2019-01-28 LAB — MAGNESIUM: Magnesium: 2 mg/dL (ref 1.7–2.4)

## 2019-01-28 LAB — GLUCOSE, CAPILLARY
Glucose-Capillary: 231 mg/dL — ABNORMAL HIGH (ref 70–99)
Glucose-Capillary: 241 mg/dL — ABNORMAL HIGH (ref 70–99)
Glucose-Capillary: 247 mg/dL — ABNORMAL HIGH (ref 70–99)
Glucose-Capillary: 254 mg/dL — ABNORMAL HIGH (ref 70–99)
Glucose-Capillary: 284 mg/dL — ABNORMAL HIGH (ref 70–99)
Glucose-Capillary: 321 mg/dL — ABNORMAL HIGH (ref 70–99)
Glucose-Capillary: 322 mg/dL — ABNORMAL HIGH (ref 70–99)

## 2019-01-28 LAB — BETA-HYDROXYBUTYRIC ACID: Beta-Hydroxybutyric Acid: 0.07 mmol/L (ref 0.05–0.27)

## 2019-01-28 LAB — HEPATIC FUNCTION PANEL
ALT: 14 U/L (ref 0–44)
AST: 15 U/L (ref 15–41)
Albumin: 3.1 g/dL — ABNORMAL LOW (ref 3.5–5.0)
Alkaline Phosphatase: 64 U/L (ref 38–126)
Bilirubin, Direct: <0.1 mg/dL (ref 0.0–0.2)
Total Bilirubin: 0.4 mg/dL (ref 0.3–1.2)
Total Protein: 5.5 g/dL — ABNORMAL LOW (ref 6.5–8.1)

## 2019-01-28 LAB — LIPASE, BLOOD: Lipase: 23 U/L (ref 11–51)

## 2019-01-28 MED ORDER — POTASSIUM CHLORIDE CRYS ER 20 MEQ PO TBCR
40.0000 meq | EXTENDED_RELEASE_TABLET | Freq: Once | ORAL | Status: AC
  Administered 2019-01-28: 40 meq via ORAL
  Filled 2019-01-28: qty 2

## 2019-01-28 MED ORDER — INSULIN ASPART 100 UNIT/ML ~~LOC~~ SOLN
5.0000 [IU] | Freq: Three times a day (TID) | SUBCUTANEOUS | Status: DC
  Administered 2019-01-29: 5 [IU] via SUBCUTANEOUS

## 2019-01-28 MED ORDER — INSULIN GLARGINE 100 UNIT/ML ~~LOC~~ SOLN
60.0000 [IU] | SUBCUTANEOUS | Status: DC
  Administered 2019-01-28: 60 [IU] via SUBCUTANEOUS
  Filled 2019-01-28 (×2): qty 0.6

## 2019-01-28 NOTE — Progress Notes (Signed)
Inpatient Diabetes Program Recommendations  AACE/ADA: New Consensus Statement on Inpatient Glycemic Control (2015)  Target Ranges:  Prepandial:   less than 140 mg/dL      Peak postprandial:   less than 180 mg/dL (1-2 hours)      Critically ill patients:  140 - 180 mg/dL   Lab Results  Component Value Date   GLUCAP 321 (H) 01/28/2019   HGBA1C 11.3 (H) 12/19/2018    Review of Glycemic Control Results for Nancy [name], Nancy [name] (MRN [id number]) as of 01/28/2019 15:19  Ref. Range 01/28/2019 03:16 01/28/2019 06:19 01/28/2019 08:13 01/28/2019 11:12  Glucose-Capillary Latest Ref Range: 70 - 99 mg/dL 701 (H) 100 (H) 349 (H) 321 (H)   Diabetes history: DM ?2 Outpatient Diabetes medications: Lantus 70 units + Humalog 15 units bid meal coverage if eats 50% Current orders for Inpatient glycemic control: Novolog 0-9 units Q4H, Lantus 48 units QD  Inpatient Diabetes Program Recommendations:    Consider increasing lantus to 60 units QD and adding Novolog 5 units TID (assuming patient is consuming >50% of meal).   Thanks, Lujean Rave, MSN, RNC-OB Diabetes Coordinator [phone] (8a-5p)

## 2019-01-28 NOTE — Progress Notes (Signed)
LTM EEG continues. Marland KitchenReset electrodes as needed.  No skin break down noted. Photic stim performed.

## 2019-01-28 NOTE — Progress Notes (Signed)
Subjective: No acute events overnight.  No seizure-like activity.  ROS: negative except above  Examination  Vital signs in last 24 hours: Temp:  [98.5 F (36.9 C)-99 F (37.2 C)] 98.5 F (36.9 C) (01/22 1401) Pulse Rate:  [101-122] 107 (01/22 1426) Resp:  [17-25] 17 (01/21 2052) BP: (108-132)/(71-87) 108/72 (01/22 1401) SpO2:  [100 %] 100 % (01/22 1426)  General: lying in bed, not in apparent distress CVS: pulse-normal rate and rhythm RS: breathing comfortably Extremities: normal   Neuro: MS: Alert, oriented, follows commands CN: pupils equal and reactive,  EOMI, face symmetric, tongue midline, normal sensation over face, Motor: 5/5 strength in all 4 extremities Reflexes: 2+ bilaterally over patella, biceps, plantars: flexor Coordination: normal Gait: not tested  Basic Metabolic Panel: Recent Labs  Lab 01/27/19 0819 01/27/19 0819 01/27/19 1230 01/27/19 1230 01/27/19 1638 01/27/19 2034 01/28/19 0104  NA 136  --  134*  --  132* 135 132*  K 4.0  --  3.7  --  3.7 5.4* 3.3*  CL 106  --  106  --  103 107 104  CO2 19*  --  19*  --  20* 19* 19*  GLUCOSE 146*  --  131*  --  232* 166* 237*  BUN 9  --  9  --  8 9 8   CREATININE 0.53  --  0.51  --  0.56 0.53 0.53  CALCIUM 8.3*   < > 8.5*   < > 8.4* 8.5* 8.3*  MG  --   --   --   --   --   --  2.0   < > = values in this interval not displayed.    CBC: Recent Labs  Lab 01/26/19 1830 01/27/19 0107 01/27/19 0557 01/28/19 0104  WBC 10.9*  --  15.2* 8.2  HGB 13.9 15.6* 12.3 12.3  HCT 43.0 46.0 38.6 37.8  MCV 81.7  --  83.9 81.8  PLT 322  --  322 338     Coagulation Studies: No results for input(s): LABPROT, INR in the last 72 hours.  Imaging CT head without contrast 01/15/2019: No acute abnormality MRI brain without contrast 12/01/2018: No acute intracranial abnormality. Stable since 2019 and negative non-contrast MRI appearance of the brain aside from suggestion of some perinatal periventricular  leukomalacia.  ASSESSMENT/PLAN: 19 year old female admitted with DKA and frequent seizures.  Convulsions DKA -Patient has had previous video EEG during which 2 episodes were captured without EEG change and were consistent with nonepileptic events. -Patient continues to report frequent seizure-like episodes.   Recommendations -Patient has uncontrolled diabetes and although less likely it is possible that hyperglycemia and/or hypoglycemia could provoke some of her seizures.   -Continue video EEG to look for any interictal discharges.  If normal tomorrow, will discontinue. -Patient needs better control of her diabetes and close follow-up with psychiatry for management of nonepileptic events. 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  Patient states she sees a therapist and will continue to work on management of her nonepileptic events. -Continue lamotrigine 50 mg twice daily and Topamax 25 mg twice daily. 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  Neurology will follow.  Please page neuro hospitalist  for any further questions after 5 PM.  I have spent a total of  35 minutes with the patient reviewing hospital notes,  test results, labs and examining the patient as well as establishing an assessment and plan that was discussed personally with the patient.  > 50% of time was spent in direct patient care.     Nancy [name] Nancy [name]

## 2019-01-28 NOTE — Progress Notes (Signed)
Ok w/ neurology for pt to disconnect from EEG while down in [place]. EEG tech notified.

## 2019-01-28 NOTE — Plan of Care (Signed)
  Problem: Education: Goal: Knowledge of General Education information will improve Description: Including pain rating scale, medication(s)/side effects and non-pharmacologic comfort measures Outcome: Progressing   Problem: Activity: Goal: Risk for activity intolerance will decrease Outcome: Progressing   Problem: Nutrition: Goal: Adequate nutrition will be maintained Outcome: Progressing   Problem: Coping: Goal: Level of anxiety will decrease Outcome: Progressing   

## 2019-01-28 NOTE — Progress Notes (Signed)
PROGRESS NOTE    Nancy [name]    Code Status: Full Code  AJO:[id number] DOB: [birth year] DOA: 01/26/2019  PCP: Nolene Ebbs, MD    Hospital Summary  This is a 19 year old female with a history of DM1, seizures vs. pseudoseizures, multiple hospitalizations, depression, anxiety who who was admitted for DKA after having reported at least 8 seizures over 24 hours at home witnessed by mom. Also found to have close COVID 19 contact exposure but negative rapid test in ED. She has since been started on DKA protocol and diabetic coordinator has been consulted.  Neurology consulted and patient is been on long-term EEG monitoring without any epileptiform activity.  Patient transitioned off DKA protocol.  Complaining of persistent abdominal pain and abdominal ultrasound was ordered 1/22  A & P   Principal Problem:   DKA (diabetic ketoacidoses) (HCC) Active Problems:   Nausea and vomiting   Type 1 diabetes mellitus with ketoacidosis without coma ([place])   1. DKA, resolved 1. Now on subcu insulin 2. Poorly controlled type 1 diabetes 1. Diabetic coordinator: Increase Lantus to 60 units daily, Add NovoLog 5 units 3 times daily, continue sliding scale 3. Nausea/Vomiting likely from DKA, resolved 4. Generalized/most notable RUQ and epigastric abdominal pain unknown etiology 1. EGD 10/12/2018: Mild gastritis, retained food and mild distal candidiasis 2. Lipase unremarkable 3. LFTs unremarkable 4. Abdominal ultrasound ordered 5. If continued symptoms and negative work-up discussed with GI for possible EGD vs outpatient follow-up 5. Chest pain - EKG without pathologic ST changes, negative troponin. Currently tachycardic. Likely musculoskeletal. Lidoderm patch, telemetry  6. Seizure-like activity- on Lamictal (recently increased) and Topamax. 1. Continue Long Term EEG per neurology 7. Depression/Anxiety - on Buspar and Seroquel 8. COVID 19 Exposure -confirmatory testing negative 9. Hypertension -  holding beta blocker  DVT prophylaxis: Lovenox Diet: Heart healthy/carb control Family Communication: Discussed with mother over the phone Disposition Plan: Barrier to discharge is abdominal ultrasound done abdominal pain, suspect discharge in next 24 hours.  Consultants  Neurology  Procedures  EEG  Antibiotics   Anti-infectives (From admission, onward)   None           Subjective   Evaluated at bedside no acute distress resting comfortably.  States that she has persistent epigastric and right upper quadrant abdominal pain as well as generalized abdominal pain which does not improve or worsen with food and has not changed with any medications that she has been given.  Denies any diarrhea any other GI symptoms at this time.  Tolerating p.o. intake.  Otherwise feels much better than when she presented  Objective   Vitals:   01/27/19 2051 01/27/19 2052 01/28/19 1401 01/28/19 1426  BP: 132/87  108/72   Pulse: (!) 101  (!) 122 (!) 107  Resp:  17    Temp: 99 F (37.2 C)  98.5 F (36.9 C)   TempSrc: Oral  Oral   SpO2: 100%  100% 100%  Weight:        Intake/Output Summary (Last 24 hours) at 01/28/2019 1750 Last data filed at 01/28/2019 0300 Gross per 24 hour  Intake 755.02 ml  Output --  Net 755.02 ml   Filed Weights   01/27/19 0645  Weight: 88.4 kg    Examination:  Physical Exam Vitals and nursing note reviewed.  Constitutional:      Appearance: She is obese.  HENT:     Head: Normocephalic.     Nose: Nose normal.     Mouth/Throat:  Mouth: Mucous membranes are moist.  Eyes:     Conjunctiva/sclera: Conjunctivae normal.  Cardiovascular:     Rate and Rhythm: Regular rhythm. Tachycardia present.  Pulmonary:     Effort: Pulmonary effort is normal. No respiratory distress.  Abdominal:     General: Bowel sounds are normal.     Palpations: Abdomen is soft.     Tenderness: There is generalized abdominal tenderness and tenderness in the right upper quadrant  and epigastric area.  Musculoskeletal:        General: No swelling or tenderness.     Cervical back: Normal range of motion.  Neurological:     General: No focal deficit present.     Mental Status: She is alert. Mental status is at baseline.  Psychiatric:        Mood and Affect: Mood normal.        Behavior: Behavior normal.     Data Reviewed: I have personally reviewed following labs and imaging studies  CBC: Recent Labs  Lab 01/26/19 1830 01/27/19 0107 01/27/19 0557 01/28/19 0104  WBC 10.9*  --  15.2* 8.2  HGB 13.9 15.6* 12.3 12.3  HCT 43.0 46.0 38.6 37.8  MCV 81.7  --  83.9 81.8  PLT 322  --  322 338   Basic Metabolic Panel: Recent Labs  Lab 01/27/19 0819 01/27/19 1230 01/27/19 1638 01/27/19 2034 01/28/19 0104  NA 136 134* 132* 135 132*  K 4.0 3.7 3.7 5.4* 3.3*  CL 106 106 103 107 104  CO2 19* 19* 20* 19* 19*  GLUCOSE 146* 131* 232* 166* 237*  BUN 9 9 8 9 8   CREATININE 0.53 0.51 0.56 0.53 0.53  CALCIUM 8.3* 8.5* 8.4* 8.5* 8.3*  MG  --   --   --   --  2.0   GFR: Estimated Creatinine Clearance: 118.3 mL/min (by C-G formula based on SCr of 0.53 mg/dL). Liver Function Tests: Recent Labs  Lab 01/26/19 1830 01/28/19 1226  AST 15 15  ALT 18 14  ALKPHOS 91 64  BILITOT 0.8 0.4  PROT 7.4 5.5*  ALBUMIN 4.1 3.1*   Recent Labs  Lab 01/26/19 1830 01/28/19 1226  LIPASE 18 23   No results for input(s): AMMONIA in the last 168 hours. Coagulation Profile: No results for input(s): INR, PROTIME in the last 168 hours. Cardiac Enzymes: No results for input(s): CKTOTAL, CKMB, CKMBINDEX, TROPONINI in the last 168 hours. BNP (last 3 results) No results for input(s): PROBNP in the last 8760 hours. HbA1C: No results for input(s): HGBA1C in the last 72 hours. CBG: Recent Labs  Lab 01/28/19 0316 01/28/19 0619 01/28/19 0813 01/28/19 1112 01/28/19 1633  GLUCAP 247* 254* 284* 321* 241*   Lipid Profile: No results for input(s): CHOL, HDL, LDLCALC, TRIG,  CHOLHDL, LDLDIRECT in the last 72 hours. Thyroid Function Tests: No results for input(s): TSH, T4TOTAL, FREET4, T3FREE, THYROIDAB in the last 72 hours. Anemia Panel: No results for input(s): VITAMINB12, FOLATE, FERRITIN, TIBC, IRON, RETICCTPCT in the last 72 hours. Sepsis Labs: Recent Labs  Lab 01/27/19 0421  LATICACIDVEN 1.3    Recent Results (from the past 240 hour(s))  Respiratory Panel by RT PCR (Flu A&B, Covid) - Nasopharyngeal Swab     Status: None   Collection Time: 01/27/19  1:19 AM   Specimen: Nasopharyngeal Swab  Result Value Ref Range Status   SARS Coronavirus 2 by RT PCR NEGATIVE NEGATIVE Final    Comment: (NOTE) SARS-CoV-2 target nucleic acids are NOT DETECTED. The SARS-CoV-2 RNA  is generally detectable in upper respiratoy specimens during the acute phase of infection. The lowest concentration of SARS-CoV-2 viral copies this assay can detect is 131 copies/mL. A negative result does not preclude SARS-Cov-2 infection and should not be used as the sole basis for treatment or other patient management decisions. A negative result may occur with  improper specimen collection/handling, submission of specimen other than nasopharyngeal swab, presence of viral mutation(s) within the areas targeted by this assay, and inadequate number of viral copies (<131 copies/mL). A negative result must be combined with clinical observations, patient history, and epidemiological information. The expected result is Negative. Fact Sheet for Patients:  https://www.moore.com/ Fact Sheet for Healthcare Providers:  https://www.young.biz/ This test is not yet ap proved or cleared by the [place] FDA and  has been authorized for detection and/or diagnosis of SARS-CoV-2 by FDA under an Emergency Use Authorization (EUA). This EUA will remain  in effect (meaning this test can be used) for the duration of the COVID-19 declaration under Section 564(b)(1) of  the Act, 21 U.S.C. section 360bbb-3(b)(1), unless the authorization is terminated or revoked sooner.    Influenza A by PCR NEGATIVE NEGATIVE Final   Influenza B by PCR NEGATIVE NEGATIVE Final    Comment: (NOTE) The Xpert Xpress SARS-CoV-2/FLU/RSV assay is intended as an aid in  the diagnosis of influenza from Nasopharyngeal swab specimens and  should not be used as a sole basis for treatment. Nasal washings and  aspirates are unacceptable for Xpert Xpress SARS-CoV-2/FLU/RSV  testing. Fact Sheet for Patients: https://www.moore.com/ Fact Sheet for Healthcare Providers: https://www.young.biz/ This test is not yet approved or cleared by the [place] FDA and  has been authorized for detection and/or diagnosis of SARS-CoV-2 by  FDA under an Emergency Use Authorization (EUA). This EUA will remain  in effect (meaning this test can be used) for the duration of the  Covid-19 declaration under Section 564(b)(1) of the Act, 21  U.S.C. section 360bbb-3(b)(1), unless the authorization is  terminated or revoked. Performed at [facility], [address], [place], [place] [address]   SARS CORONAVIRUS 2 (TAT 6-24 HRS) Nasopharyngeal Nasopharyngeal Swab     Status: None   Collection Time: 01/27/19  7:35 AM   Specimen: Nasopharyngeal Swab  Result Value Ref Range Status   SARS Coronavirus 2 NEGATIVE NEGATIVE Final    Comment: (NOTE) SARS-CoV-2 target nucleic acids are NOT DETECTED. The SARS-CoV-2 RNA is generally detectable in upper and lower respiratory specimens during the acute phase of infection. Negative results do not preclude SARS-CoV-2 infection, do not rule out co-infections with other pathogens, and should not be used as the sole basis for treatment or other patient management decisions. Negative results must be combined with clinical observations, patient history, and epidemiological information. The expected result is Negative. Fact  Sheet for Patients: HairSlick.no Fact Sheet for Healthcare Providers: quierodirigir.com This test is not yet approved or cleared by the [place] FDA and  has been authorized for detection and/or diagnosis of SARS-CoV-2 by FDA under an Emergency Use Authorization (EUA). This EUA will remain  in effect (meaning this test can be used) for the duration of the COVID-19 declaration under Section 56 4(b)(1) of the Act, 21 U.S.C. section 360bbb-3(b)(1), unless the authorization is terminated or revoked sooner. Performed at [facility], [address], [place], [place] [address]          Radiology Studies: EEG  Result Date: 01/27/2019 Charlsie Quest, MD  01/27/2019 12:51 PM Patient Name: Nancy [name] MRN: [id number] Epilepsy Attending: Charlsie Quest Referring Physician/Provider: Dr. Lindie Spruce Date: 01/27/2019 Duration: 23.46 minutes Patient history: 19 year old female with whole-body shaking seizure-like episodes.  EEG evaluate for seizures. Level of alertness: Awake AEDs during EEG study: Topamax, lamotrigine Technical aspects: This EEG study was done with scalp electrodes positioned according to the 10-20 International system of electrode placement. Electrical activity was acquired at a sampling rate of 500Hz  and reviewed with a high frequency filter of 70Hz  and a low frequency filter of 1Hz . EEG data were recorded continuously and digitally stored. Description: The posterior dominant rhythm consists of 8-9 Hz activity of moderate voltage (25-35 uV) seen predominantly in posterior head regions, symmetric and reactive to eye opening and eye closing.  Hyperventilation and photic stimulation were not performed. IMPRESSION: This study is within normal limits. No seizures or epileptiform discharges were seen throughout the recording.   DG Chest Port 1 View  Result Date: 01/27/2019 CLINICAL DATA:  Cough.  EXAM: PORTABLE CHEST 1 VIEW COMPARISON:  December 19, 2018 FINDINGS: The heart size and mediastinal contours are within normal limits. Both lungs are clear. The visualized skeletal structures are unremarkable. IMPRESSION: No active disease. Electronically Signed   By: Charlsie Quest M.D.   On: 01/27/2019 01:13        Scheduled Meds: . atorvastatin  10 mg Oral BID  . busPIRone  10 mg Oral BID  . cycloSPORINE  1 drop Both Eyes BID  . enoxaparin (LOVENOX) injection  40 mg Subcutaneous Q24H  . hydrOXYzine  50 mg Oral TID  . insulin aspart  0-9 Units Subcutaneous Q4H  . [START ON 01/29/2019] insulin aspart  5 Units Subcutaneous TID WC  . insulin glargine  60 Units Subcutaneous Q24H  . lamoTRIgine  50 mg Oral BID  . lidocaine  1 patch Transdermal Q24H  . metoCLOPramide (REGLAN) injection  10 mg Intravenous Q6H  . mirtazapine  7.5 mg Oral QHS  . olopatadine  1 drop Both Eyes BID  . QUEtiapine  400 mg Oral QHS  . topiramate  25 mg Oral BID   Continuous Infusions: . insulin Stopped (01/28/19 0112)     LOS: 1 day    Time spent: 30 minutes with over 50% of the time coordinating the patient's care    01/29/2019, DO [facility] Pager [phone]  If 7PM-7AM, please contact night-coverage [email or web address] Password [email or web address] 01/28/2019, 5:50 PM

## 2019-01-29 ENCOUNTER — Inpatient Hospital Stay (HOSPITAL_COMMUNITY): Payer: Medicaid Other

## 2019-01-29 LAB — BASIC METABOLIC PANEL
Anion gap: 9 (ref 5–15)
BUN: 12 mg/dL (ref 6–20)
CO2: 23 mmol/L (ref 22–32)
Calcium: 8.7 mg/dL — ABNORMAL LOW (ref 8.9–10.3)
Chloride: 103 mmol/L (ref 98–111)
Creatinine, Ser: 0.8 mg/dL (ref 0.44–1.00)
GFR calc Af Amer: >60 mL/min (ref >60)
GFR calc non Af Amer: >60 mL/min (ref >60)
Glucose, Bld: 289 mg/dL — ABNORMAL HIGH (ref 70–99)
Potassium: 3.9 mmol/L (ref 3.5–5.1)
Sodium: 135 mmol/L (ref 135–145)

## 2019-01-29 LAB — CBC
HCT: 35.3 % — ABNORMAL LOW (ref 36.0–46.0)
Hemoglobin: 11.4 g/dL — ABNORMAL LOW (ref 12.0–15.0)
MCH: 26.4 pg (ref 26.0–34.0)
MCHC: 32.3 g/dL (ref 30.0–36.0)
MCV: 81.7 fL (ref 80.0–100.0)
Platelets: 301 10*3/uL (ref 150–400)
RBC: 4.32 MIL/uL (ref 3.87–5.11)
RDW: 13.6 % (ref 11.5–15.5)
WBC: 8.2 10*3/uL (ref 4.0–10.5)
nRBC: 0 % (ref 0.0–0.2)

## 2019-01-29 LAB — GLUCOSE, CAPILLARY
Glucose-Capillary: 236 mg/dL — ABNORMAL HIGH (ref 70–99)
Glucose-Capillary: 269 mg/dL — ABNORMAL HIGH (ref 70–99)
Glucose-Capillary: 276 mg/dL — ABNORMAL HIGH (ref 70–99)
Glucose-Capillary: 351 mg/dL — ABNORMAL HIGH (ref 70–99)

## 2019-01-29 LAB — MAGNESIUM: Magnesium: 1.8 mg/dL (ref 1.7–2.4)

## 2019-01-29 IMAGING — CR DG CHEST 2V
2 series · 2 of 2 positions shown · non-contrast
Comparison: 01/16/2018

CLINICAL DATA: Chest pain and short of breath

EXAM:
CHEST - 2 VIEW

[chest pa]
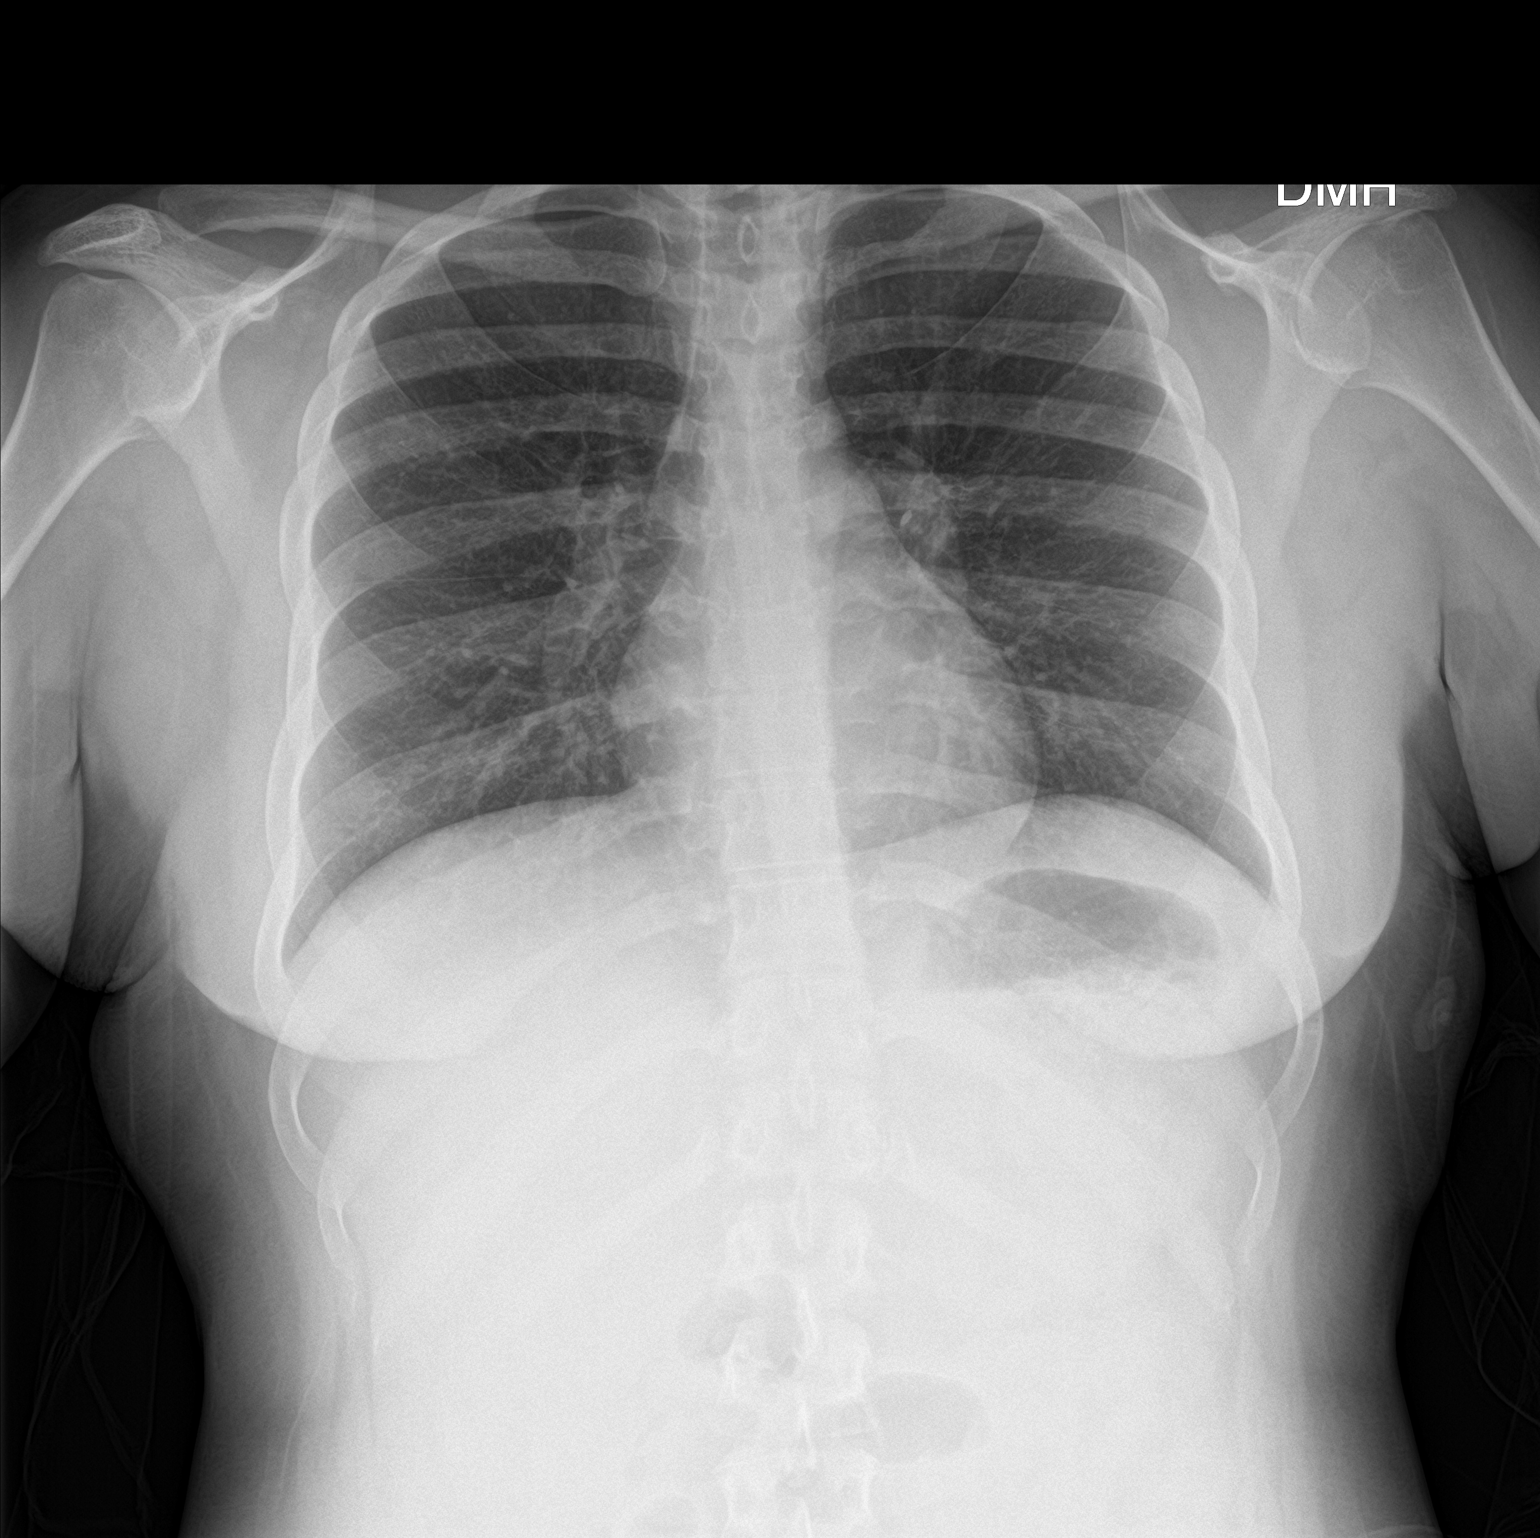

[chest lat]
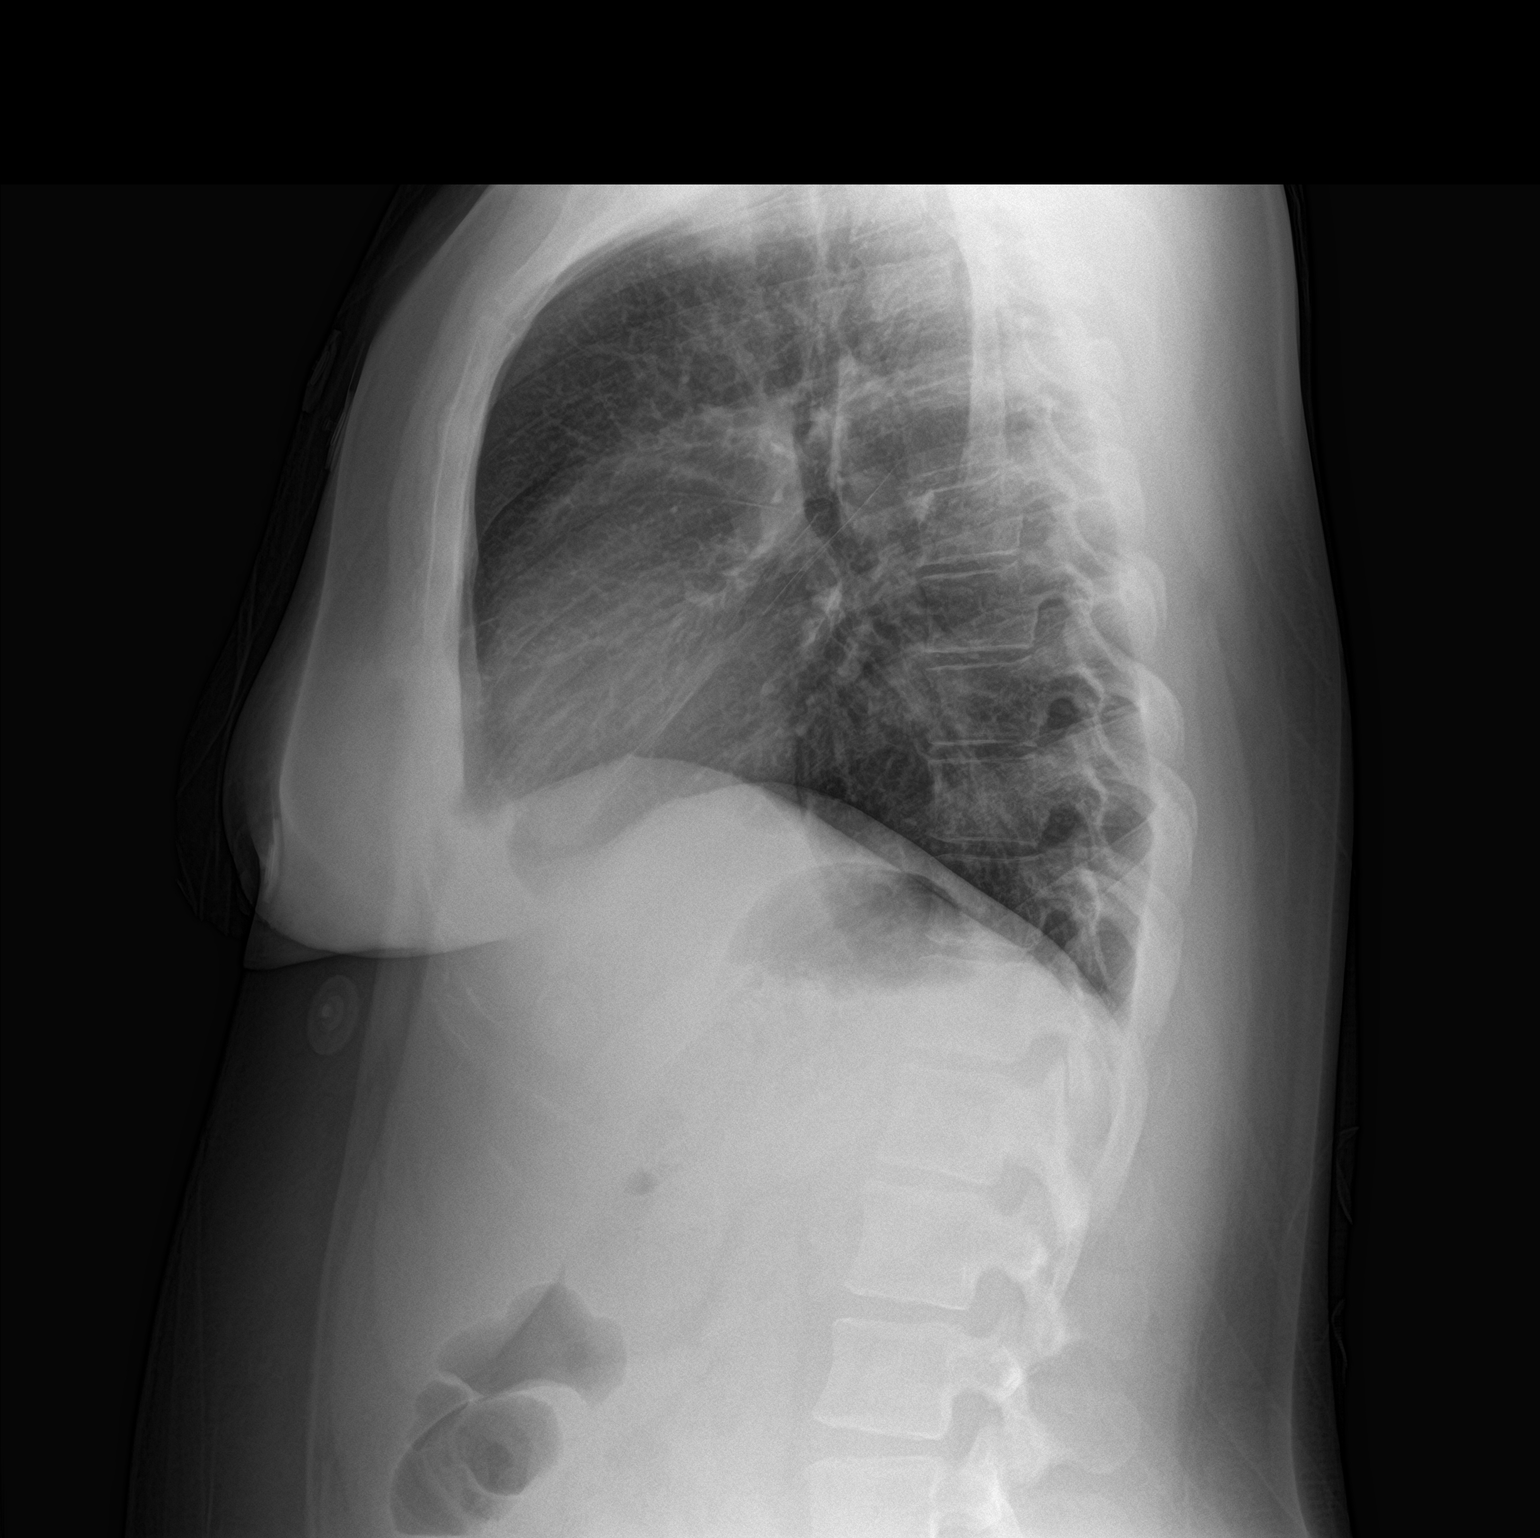

[2 of 2 positions shown; findings below may reference images not displayed]

FINDINGS: The heart size and mediastinal contours are within normal limits.
Both lungs are clear. The visualized skeletal structures are
unremarkable.
IMPRESSION: No active cardiopulmonary disease.

## 2019-01-29 MED ORDER — POLYETHYLENE GLYCOL 3350 17 G PO PACK
17.0000 g | PACK | Freq: Every day | ORAL | Status: DC
  Administered 2019-01-29: 17 g via ORAL
  Filled 2019-01-29: qty 1

## 2019-01-29 MED ORDER — INSULIN LISPRO 100 UNIT/ML ~~LOC~~ SOLN: 10.0000 [IU] | Freq: Three times a day (TID) | SUBCUTANEOUS | 6 refills | Status: DC

## 2019-01-29 MED ORDER — PANTOPRAZOLE SODIUM 40 MG PO TBEC
40.0000 mg | DELAYED_RELEASE_TABLET | Freq: Two times a day (BID) | ORAL | Status: DC
  Administered 2019-01-29: 40 mg via ORAL
  Filled 2019-01-29: qty 1

## 2019-01-29 MED ORDER — SENNA 8.6 MG PO TABS
1.0000 | ORAL_TABLET | Freq: Every day | ORAL | Status: DC
  Administered 2019-01-29: 8.6 mg via ORAL
  Filled 2019-01-29: qty 1

## 2019-01-29 MED ORDER — PANTOPRAZOLE SODIUM 40 MG PO TBEC: 40.0000 mg | DELAYED_RELEASE_TABLET | Freq: Two times a day (BID) | ORAL | 1 refills | Status: DC

## 2019-01-29 MED ORDER — ATORVASTATIN CALCIUM 10 MG PO TABS: 20.0000 mg | ORAL_TABLET | Freq: Every day | ORAL | Status: DC

## 2019-01-29 NOTE — Plan of Care (Signed)
-   LTM eeg didn't show any evidence of epilepsy. 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  - No change in AEds is needed.  - Please avoid administering ativan or benzodiazepines for rescue if patient has seizure like episodes.  - Continue follow up with Psychiatry/therapist for cognitive behavioral therapy  - management of diabetes per primary team - seizure precautions including DO NOT DRIVE  Nancy [name] Nancy [name]

## 2019-01-29 NOTE — Progress Notes (Signed)
LTM EEG discontinued - no skin breakdown at unhook.   

## 2019-01-29 NOTE — Procedures (Addendum)
Patient Name:Nancy [name] TSV:[id number] Epilepsy Attending:Malyn Aytes [name] Referring Physician/Provider:Dr. Tinnie Gens Duration:01/28/2019 1241 to 01/29/2019 1241  Patient history:19 year old female with whole-body shaking seizure-like episodes. EEG evaluate for seizures.  Level of alertness:Awake, asleep  AEDs during EEG study:Topamax, lamotrigine  Technical aspects: This EEG study was done with scalp electrodes positioned according to the 10-20 International system of electrode placement. Electrical activity was acquired at a sampling rate of 500Hz  and reviewed with a high frequency filter of 70Hz  and a low frequency filter of 1Hz . EEG data were recorded continuously and digitally stored.  Description:The posterior dominant rhythm consists of8-9Hz  activity of moderate voltage (25-35 uV) seen predominantly in posterior head regions, symmetric and reactive to eye opening and eye closing. Sleep was characterized by vertex waves, sleep spindles (12-14hz ) maximal frontocentral, REM sleep. Photic driving was not seen during photic stimulation.   IMPRESSION: This study is within normal limits. No seizures or epileptiform discharges were seen throughout the recording.  [name] 

## 2019-01-29 NOTE — Procedures (Addendum)
Patient Name: THALYA FOUCHE  MRN: [id number]  Epilepsy Attending: Charlsie Quest  Referring Physician/Provider: Dr. Lindie Spruce Duration: 01/27/2019 1241 to 01/28/2019 1241  Patient history: 19 year old female with whole-body shaking seizure-like episodes.  EEG evaluate for seizures.  Level of alertness: Awake, asleep  AEDs during EEG study: Topamax, lamotrigine  Technical aspects: This EEG study was done with scalp electrodes positioned according to the 10-20 International system of electrode placement. Electrical activity was acquired at a sampling rate of 500Hz  and reviewed with a high frequency filter of 70Hz  and a low frequency filter of 1Hz . EEG data were recorded continuously and digitally stored.   Description: The posterior dominant rhythm consists of 8-9 Hz activity of moderate voltage (25-35 uV) seen predominantly in posterior head regions, symmetric and reactive to eye opening and eye closing. Sleep was characterized by vertex waves, sleep spindles (12-14hz ) maximal frontocentral, REM sleep. Photic driving was not seen during photic stimulation.   IMPRESSION: This study is within normal limits. No seizures or epileptiform discharges were seen throughout the recording.  Joziah Dollins 

## 2019-01-29 NOTE — Plan of Care (Signed)

## 2019-01-29 NOTE — Procedures (Addendum)
Patient Name:Nancy [name] AXE:[id number] Epilepsy Attending:Nasri Boakye [name] Referring Physician/Provider:Dr. Tinnie Gens Duration:01/29/2019 1241 to 01/29/2019 1653  Patient history:19 year old female with whole-body shaking seizure-like episodes. EEG evaluate for seizures.  Level of alertness:Awake, asleep  AEDs during EEG study:Topamax, lamotrigine  Technical aspects: This EEG study was done with scalp electrodes positioned according to the 10-20 International system of electrode placement. Electrical activity was acquired at a sampling rate of 500Hz  and reviewed with a high frequency filter of 70Hz  and a low frequency filter of 1Hz . EEG data were recorded continuously and digitally stored.  Description:The posterior dominant rhythm consists of8-9Hz  activity of moderate voltage (25-35 uV) seen predominantly in posterior head regions, symmetric and reactive to eye opening and eye closing. Sleep was characterized by vertex waves, sleep spindles (12-14hz ) maximal frontocentral. Photic driving was not seen during photic stimulation.  IMPRESSION: This study is within normal limits. No seizures or epileptiform discharges were seen throughout the recording.  Timmi Devora 

## 2019-01-29 NOTE — Discharge Summary (Signed)
Physician Discharge Summary  Nancy [name] XFG:[id number] DOB: 2000/10/01 DOA: 01/26/2019  PCP: Fleet Contras, MD  Admit date: 01/26/2019 Discharge date: 01/29/2019   Code Status: Full Code  Admitted From: Home Discharged to: Home Home Health: None Equipment/Devices: None Discharge Condition: Stable  Recommendations for Outpatient Follow-up   1. Patient needs to schedule follow-up appointment with her gastroenterologist most likely from repeat endoscopy in the next 2 weeks 2. Patient needs better diabetic control, please encourage proper diet and medication compliance 3. Nonepileptic seizures, continue behavioral therapy and following with psych.  Changes to medications.  Hospital Summary  This is a 19 year old female with a history of DM1, seizures vs. pseudoseizures, multiple hospitalizations, depression, anxiety who who was admitted for DKA after having reported at least 8 seizures over 24 hours at home witnessed by mom. Also found to have close COVID 19 contact exposure but negative rapid test in ED. She has since been started on DKA protocol and diabetic coordinator has been consulted.  Neurology consulted and patient is been on long-term EEG monitoring without any epileptiform activity.  Patient transitioned off DKA protocol.  Complaining of persistent abdominal pain and abdominal ultrasound was ordered 1/22 which was unremarkable.  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  She was advised to follow-up with her gastroenterologist in the next few weeks.  A & P   Principal Problem:   DKA (diabetic ketoacidoses) (HCC) Active Problems:   Nausea and vomiting   Type 1 diabetes mellitus with ketoacidosis without coma (HCC)   1. DKA, resolved 1. She was on insulin drip treated with DKA protocol.  Transitioned successfully to subcutaneous insulin 2. Poorly controlled type 1 diabetes 1. Patient discharged  with Lantus 70 units nightly Humalog 10 units 3 times daily with meals 2. She needs close follow-up for her diabetes 3. Diabetic diet education given 3. Nausea/Vomiting likely from DKA, resolved 4. Generalized/most notable RUQ and epigastric abdominal pain, hematemesis unknown etiology 1. EGD 10/12/2018: Mild gastritis, retained food and mild distal candidiasis.  Completed Diflucan at that time and was recommended to follow-up with her GI specialist however she has not followed up yet. 2. Also complained of hematemesis, remained hemodynamically stable and hemoglobin stable 3. Lipase unremarkable 4. LFTs unremarkable 5. Abdominal ultrasound negative 6. Discussed findings with GI who recommended outpatient follow-up 5. Musculoskeletal chest pain - EKG without pathologic ST changes, negative troponin.  1. Lidoderm patch, telemetrywhile inpatient 2. Monitor outpatient 6. Seizure-like activity- on Lamictal (recently increased) and Topamax. 1. Completed Long Term EEG per neurology and without any epileptiform activity 2. Per neurology: Continue current AEDs continue current behavioral therapy and psych follow-up, patient is not allowed to drive until cleared by physician. Please avoid administering ativan or benzodiazepines for rescue if patient has seizure like episodes.  7. Depression/Anxiety - on Buspar and Seroquel and propranolol 8. COVID 19 Exposure -confirmatory testing negative 9. Hypertension - beta blocker held during admission, remained tachycardic.  Reinstituted at discharge    Consultants  . Neurology . Discussed findings and will follow with GI  Procedures  . Long-term EEG-negative  Antibiotics  None   Subjective  Seen and examined at bedside no acute distress resting comfortably.  Continues to complain of abdominal pain but does have an appetite.  Also admitted to hematemesis overnight.  I discussed this with her nurse had report of pink-tinged vomitus overnight.  This  seems to be a chronic issue for her.  Hemoglobin remained stable and she did not require any treatment or intervention otherwise no other complaints and no recurrence of seizure-like activity.  Objective   Discharge Exam: Vitals:   01/28/19 2133 01/29/19 0605  BP: 122/78 116/70  Pulse: (!) 103 (!) 103  Resp: 18   Temp: 98.7 F (37.1 C) 98.1 F (36.7 C)  SpO2: 100% 100%   Vitals:   01/28/19 1401 01/28/19 1426 01/28/19 2133 01/29/19 0605  BP: 108/72  122/78 116/70  Pulse: (!) 122 (!) 107 (!) 103 (!) 103  Resp:   18   Temp: 98.5 F (36.9 C)  98.7 F (37.1 C) 98.1 F (36.7 C)  TempSrc: Oral  Oral Oral  SpO2: 100% 100% 100% 100%  Weight:        Physical Exam Vitals and nursing note reviewed.  Constitutional:      Appearance: Normal appearance.  HENT:     Head: Normocephalic and atraumatic.     Nose: Nose normal.     Mouth/Throat:     Mouth: Mucous membranes are moist.  Eyes:     Extraocular Movements: Extraocular movements intact.  Cardiovascular:     Rate and Rhythm: Regular rhythm. Tachycardia present.  Pulmonary:     Effort: Pulmonary effort is normal.     Breath sounds: Normal breath sounds.  Abdominal:     General: Abdomen is flat.     Palpations: Abdomen is soft.     Comments: Epigastric tenderness to palpation  Musculoskeletal:        General: No swelling or tenderness.     Cervical back: Normal range of motion. No rigidity.  Neurological:     General: No focal deficit present.     Mental Status: She is alert. Mental status is at baseline.  Psychiatric:        Mood and Affect: Mood normal.        Behavior: Behavior normal.       The results of significant diagnostics from this hospitalization (including imaging, microbiology, ancillary and laboratory) are listed below for reference.     Microbiology: Recent Results (from the past 240 hour(s))  Respiratory Panel by RT PCR (Flu A&B, Covid) - Nasopharyngeal Swab     Status: None   Collection Time:  01/27/19  1:19 AM   Specimen: Nasopharyngeal Swab  Result Value Ref Range Status   SARS Coronavirus 2 by RT PCR NEGATIVE NEGATIVE Final    Comment: (NOTE) SARS-CoV-2 target nucleic acids are NOT DETECTED. The SARS-CoV-2 RNA is generally detectable in upper respiratoy specimens during the acute phase of infection. The lowest concentration of SARS-CoV-2 viral copies this assay can detect is 131 copies/mL. A negative result does not preclude SARS-Cov-2 infection and should not be used as the sole basis for treatment or other patient management decisions. A negative result may occur with  improper specimen collection/handling, submission of specimen other than nasopharyngeal swab, presence of viral mutation(s) within the areas targeted by this assay, and inadequate number of viral copies (<131 copies/mL). A negative result must be combined with clinical observations, patient history, and epidemiological information. The expected result is Negative. Fact Sheet for Patients:  https://www.moore.com/ Fact Sheet for Healthcare Providers:  https://www.young.biz/ This test is not yet ap proved or cleared by the [place] FDA and  has been authorized for detection and/or diagnosis of SARS-CoV-2 by FDA under an Emergency Use Authorization (EUA). This EUA will remain  in effect (meaning this test can be used) for the duration of the COVID-19  declaration under Section 564(b)(1) of the Act, 21 U.S.C. section 360bbb-3(b)(1), unless the authorization is terminated or revoked sooner.    Influenza A by PCR NEGATIVE NEGATIVE Final   Influenza B by PCR NEGATIVE NEGATIVE Final    Comment: (NOTE) The Xpert Xpress SARS-CoV-2/FLU/RSV assay is intended as an aid in  the diagnosis of influenza from Nasopharyngeal swab specimens and  should not be used as a sole basis for treatment. Nasal washings and  aspirates are unacceptable for Xpert Xpress SARS-CoV-2/FLU/RSV   testing. Fact Sheet for Patients: https://www.moore.com/ Fact Sheet for Healthcare Providers: https://www.young.biz/ This test is not yet approved or cleared by the [place] FDA and  has been authorized for detection and/or diagnosis of SARS-CoV-2 by  FDA under an Emergency Use Authorization (EUA). This EUA will remain  in effect (meaning this test can be used) for the duration of the  Covid-19 declaration under Section 564(b)(1) of the Act, 21  U.S.C. section 360bbb-3(b)(1), unless the authorization is  terminated or revoked. Performed at [facility], [address], [place], [place] [address]   SARS CORONAVIRUS 2 (TAT 6-24 HRS) Nasopharyngeal Nasopharyngeal Swab     Status: None   Collection Time: 01/27/19  7:35 AM   Specimen: Nasopharyngeal Swab  Result Value Ref Range Status   SARS Coronavirus 2 NEGATIVE NEGATIVE Final    Comment: (NOTE) SARS-CoV-2 target nucleic acids are NOT DETECTED. The SARS-CoV-2 RNA is generally detectable in upper and lower respiratory specimens during the acute phase of infection. Negative results do not preclude SARS-CoV-2 infection, do not rule out co-infections with other pathogens, and should not be used as the sole basis for treatment or other patient management decisions. Negative results must be combined with clinical observations, patient history, and epidemiological information. The expected result is Negative. Fact Sheet for Patients: HairSlick.no Fact Sheet for Healthcare Providers: quierodirigir.com This test is not yet approved or cleared by the [place] FDA and  has been authorized for detection and/or diagnosis of SARS-CoV-2 by FDA under an Emergency Use Authorization (EUA). This EUA will remain  in effect (meaning this test can be used) for the duration of the COVID-19 declaration under Section 56 4(b)(1) of the Act, 21  U.S.C. section 360bbb-3(b)(1), unless the authorization is terminated or revoked sooner. Performed at [facility], [address] [place], [place] [address]      Labs: BNP (last 3 results) Recent Labs    01/29/18 2134 09/10/18 1424 09/12/18 2244  BNP 12.4 13.1 13.8   Basic Metabolic Panel: Recent Labs  Lab 01/27/19 1230 01/27/19 1638 01/27/19 2034 01/28/19 0104 01/29/19 0518  NA 134* 132* 135 132* 135  K 3.7 3.7 5.4* 3.3* 3.9  CL 106 103 107 104 103  CO2 19* 20* 19* 19* 23  GLUCOSE 131* 232* 166* 237* 289*  BUN 9 8 9 8 12   CREATININE 0.51 0.56 0.53 0.53 0.80  CALCIUM 8.5* 8.4* 8.5* 8.3* 8.7*  MG  --   --   --  2.0 1.8   Liver Function Tests: Recent Labs  Lab 01/26/19 1830 01/28/19 1226  AST 15 15  ALT 18 14  ALKPHOS 91 64  BILITOT 0.8 0.4  PROT 7.4 5.5*  ALBUMIN 4.1 3.1*   Recent Labs  Lab 01/26/19 1830 01/28/19 1226  LIPASE 18 23   No results for input(s): AMMONIA in the last 168 hours. CBC: Recent Labs  Lab 01/26/19 1830 01/27/19 0107 01/27/19 0557 01/28/19 0104 01/29/19 0518  WBC 10.9*  --  15.2* 8.2 8.2  HGB 13.9 15.6* 12.3 12.3 11.4*  HCT 43.0 46.0 38.6 37.8 35.3*  MCV 81.7  --  83.9 81.8 81.7  PLT 322  --  322 338 301   Cardiac Enzymes: No results for input(s): CKTOTAL, CKMB, CKMBINDEX, TROPONINI in the last 168 hours. BNP: Invalid input(s): POCBNP CBG: Recent Labs  Lab 01/28/19 1633 01/28/19 2039 01/29/19 0003 01/29/19 0603 01/29/19 0727  GLUCAP 241* 231* 351* 236* 276*   D-Dimer No results for input(s): DDIMER in the last 72 hours. Hgb A1c No results for input(s): HGBA1C in the last 72 hours. Lipid Profile No results for input(s): CHOL, HDL, LDLCALC, TRIG, CHOLHDL, LDLDIRECT in the last 72 hours. Thyroid function studies No results for input(s): TSH, T4TOTAL, T3FREE, THYROIDAB in the last 72 hours.  Invalid input(s): FREET3 Anemia work up No results for input(s): VITAMINB12, FOLATE, FERRITIN, TIBC, IRON,  RETICCTPCT in the last 72 hours. Urinalysis    Component Value Date/Time   COLORURINE YELLOW 01/27/2019 0301   APPEARANCEUR HAZY (A) 01/27/2019 0301   LABSPEC 1.032 (H) 01/27/2019 0301   PHURINE 5.0 01/27/2019 0301   GLUCOSEU >=500 (A) 01/27/2019 0301   HGBUR LARGE (A) 01/27/2019 0301   BILIRUBINUR NEGATIVE 01/27/2019 0301   BILIRUBINUR negative 07/13/2018 1651   KETONESUR 80 (A) 01/27/2019 0301   PROTEINUR 30 (A) 01/27/2019 0301   UROBILINOGEN 0.2 07/13/2018 1651   UROBILINOGEN 1.0 07/09/2017 1324   NITRITE NEGATIVE 01/27/2019 0301   LEUKOCYTESUR NEGATIVE 01/27/2019 0301   Sepsis Labs Invalid input(s): PROCALCITONIN,  WBC,  LACTICIDVEN Microbiology Recent Results (from the past 240 hour(s))  Respiratory Panel by RT PCR (Flu A&B, Covid) - Nasopharyngeal Swab     Status: None   Collection Time: 01/27/19  1:19 AM   Specimen: Nasopharyngeal Swab  Result Value Ref Range Status   SARS Coronavirus 2 by RT PCR NEGATIVE NEGATIVE Final    Comment: (NOTE) SARS-CoV-2 target nucleic acids are NOT DETECTED. The SARS-CoV-2 RNA is generally detectable in upper respiratoy specimens during the acute phase of infection. The lowest concentration of SARS-CoV-2 viral copies this assay can detect is 131 copies/mL. A negative result does not preclude SARS-Cov-2 infection and should not be used as the sole basis for treatment or other patient management decisions. A negative result may occur with  improper specimen collection/handling, submission of specimen other than nasopharyngeal swab, presence of viral mutation(s) within the areas targeted by this assay, and inadequate number of viral copies (<131 copies/mL). A negative result must be combined with clinical observations, patient history, and epidemiological information. The expected result is Negative. Fact Sheet for Patients:  https://www.moore.com/ Fact Sheet for Healthcare Providers:   https://www.young.biz/ This test is not yet ap proved or cleared by the [place] FDA and  has been authorized for detection and/or diagnosis of SARS-CoV-2 by FDA under an Emergency Use Authorization (EUA). This EUA will remain  in effect (meaning this test can be used) for the duration of the COVID-19 declaration under Section 564(b)(1) of the Act, 21 U.S.C. section 360bbb-3(b)(1), unless the authorization is terminated or revoked sooner.    Influenza A by PCR NEGATIVE NEGATIVE Final   Influenza B by PCR NEGATIVE NEGATIVE Final    Comment: (NOTE) The Xpert Xpress SARS-CoV-2/FLU/RSV assay is intended as an aid in  the diagnosis of influenza from Nasopharyngeal swab specimens and  should not be used as a sole basis for treatment. Nasal washings and  aspirates are unacceptable for Xpert Xpress SARS-CoV-2/FLU/RSV  testing. Fact Sheet  for Patients: PinkCheek.be Fact Sheet for Healthcare Providers: GravelBags.it This test is not yet approved or cleared by the [place] FDA and  has been authorized for detection and/or diagnosis of SARS-CoV-2 by  FDA under an Emergency Use Authorization (EUA). This EUA will remain  in effect (meaning this test can be used) for the duration of the  Covid-19 declaration under Section 564(b)(1) of the Act, 21  U.S.C. section 360bbb-3(b)(1), unless the authorization is  terminated or revoked. Performed at [facility], [place] [address] [place], [place] [address]   SARS CORONAVIRUS 2 (TAT 6-24 HRS) Nasopharyngeal Nasopharyngeal Swab     Status: None   Collection Time: 01/27/19  7:35 AM   Specimen: Nasopharyngeal Swab  Result Value Ref Range Status   SARS Coronavirus 2 NEGATIVE NEGATIVE Final    Comment: (NOTE) SARS-CoV-2 target nucleic acids are NOT DETECTED. The SARS-CoV-2 RNA is generally detectable in upper and lower respiratory specimens during the acute phase  of infection. Negative results do not preclude SARS-CoV-2 infection, do not rule out co-infections with other pathogens, and should not be used as the sole basis for treatment or other patient management decisions. Negative results must be combined with clinical observations, patient history, and epidemiological information. The expected result is Negative. Fact Sheet for Patients: SugarRoll.be Fact Sheet for Healthcare Providers: https://www.woods-mathews.com/ This test is not yet approved or cleared by the [place] FDA and  has been authorized for detection and/or diagnosis of SARS-CoV-2 by FDA under an Emergency Use Authorization (EUA). This EUA will remain  in effect (meaning this test can be used) for the duration of the COVID-19 declaration under Section 56 4(b)(1) of the Act, 21 U.S.C. section 360bbb-3(b)(1), unless the authorization is terminated or revoked sooner. Performed at [facility], [place] [address], [place], [place] [address]     Discharge Instructions     Discharge Instructions    Diet - low sodium heart healthy   Complete by: As directed    Diet Carb Modified   Complete by: As directed    Discharge instructions   Complete by: As directed    You were seen and examined in the hospital for DKA, seizure-like activity and abdominal pain and cared for by a hospitalist.   Your abdominal ultrasound was normal  Upon Discharge:  -Your insulin was changed: Humalog 10 units 3 times daily with meals, continue Lantus nightly.  Please adhere to a low carbohydrate diet.  Please refer to handout at discharge -Continue taking your current antiseizure medications -Take Protonix 40 mg twice daily -Schedule an appointment with your gastroenterologist in the next 2 weeks recurrent abdominal pain.  You may need a repeat endoscopy Make an appointment with your primary care physician within 7 days Get lab work prior to your  follow up appointment with your PCP Bring all home medications to your appointment to review Request that your primary physician go over all hospital tests and procedures/radiological results at the follow up.   Please get all hospital records sent to your physician by signing a hospital release before you go home.     Read the complete instructions along with all the possible side effects for all the medicines you take and that have been prescribed to you. Take any new medicines after you have completely understood and accept all the possible adverse reactions/side effects.   If you have any questions about your discharge medications or the care you received while you were in the hospital, you  can call the unit and asked to speak with the hospitalist on call. Once you are discharged, your primary care physician will handle any further medical issues. Please note that NO REFILLS for any discharge medications will be authorized, as it is imperative that you return to your primary care physician (or establish a relationship with a primary care physician if you do not have one) for your aftercare needs so that they can reassess your need for medications and monitor your lab values.   Do not drive, operate heavy machinery, perform activities at heights, swimming or participation in water activities or provide baby sitting services if your were admitted for loss of consciousness/seizures or if you are on sedating medications including, but not limited to benzodiazepines, sleep medications, narcotic pain medications, etc., until you have been cleared to do so by a medical doctor.   Do not take more than prescribed medications.   Wear a seat belt while driving.  If you have smoked or chewed Tobacco in the last 2 years please stop smoking; also stop any regular Alcohol and/or any Recreational drug use including marijuana.  If you experience worsening of your admission symptoms or develop shortness of  breath, chest pain, suicidal or homicidal thoughts or experience a life threatening emergency, you must seek medical attention immediately by calling 911 or calling your PCP immediately.   Driving Restrictions   Complete by: As directed    You cannot drive until you are cleared by a physician due to your history of seizures.   Increase activity slowly   Complete by: As directed      Allergies as of 01/29/2019      Reactions   Ibuprofen Other (See Comments)   GI MD said to not take this anymore   Oatmeal Rash      Medication List    TAKE these medications   atorvastatin 20 MG tablet Commonly known as: LIPITOR Take 1 tablet (20 mg total) by mouth daily at 6 PM. What changed:   how much to take  when to take this   bisacodyl 10 MG suppository Commonly known as: DULCOLAX Place 1 suppository (10 mg total) rectally daily.   busPIRone 10 MG tablet Commonly known as: BUSPAR Take 10 mg by mouth 2 (two) times daily.   cycloSPORINE 0.05 % ophthalmic emulsion Commonly known as: RESTASIS Place 1 drop into both eyes 2 (two) times daily.   hydrOXYzine 50 MG tablet Commonly known as: ATARAX/VISTARIL Take 1 tablet (50 mg total) by mouth 3 (three) times daily as needed for anxiety. What changed: when to take this   insulin glargine 100 UNIT/ML injection Commonly known as: LANTUS Inject 0.6 mLs (60 Units total) into the skin at bedtime. For diabetes management What changed: how much to take   insulin lispro 100 UNIT/ML injection Commonly known as: HUMALOG Inject 0.1 mLs (10 Units total) into the skin 3 (three) times daily with meals. What changed:   how much to take  when to take this   lactulose 10 GM/15ML solution Commonly known as: CHRONULAC Take 30 mLs (20 g total) by mouth 3 (three) times daily.   lamoTRIgine 25 MG tablet Commonly known as: LAMICTAL Take 2 tablets (50 mg total) by mouth 2 (two) times daily. For mood stabilization   lubiprostone 24 MCG  capsule Commonly known as: AMITIZA Take 1 capsule (24 mcg total) by mouth 2 (two) times daily with a meal. What changed: when to take this   meloxicam 7.5 MG tablet  Commonly known as: MOBIC Take 7.5 mg by mouth daily as needed for pain.   metoCLOPramide 10 MG tablet Commonly known as: REGLAN Take 1 tablet (10 mg total) by mouth 4 (four) times daily -  before meals and at bedtime. What changed:   when to take this  reasons to take this   mirtazapine 7.5 MG tablet Commonly known as: REMERON Take 1 tablet (7.5 mg total) by mouth at bedtime. For depression/sleep   multivitamin with minerals Tabs tablet Take 1 tablet by mouth daily.   pantoprazole 40 MG tablet Commonly known as: PROTONIX Take 1 tablet (40 mg total) by mouth 2 (two) times daily. For acid reflux   Pazeo 0.7 % Soln Generic drug: Olopatadine HCl Place 2 drops into both eyes 2 (two) times daily.   polyethylene glycol 17 g packet Commonly known as: MIRALAX / GLYCOLAX Take 17 g by mouth daily.   prochlorperazine 25 MG suppository Commonly known as: COMPAZINE Place 1 suppository (25 mg total) rectally every 12 (twelve) hours as needed for nausea or vomiting.   promethazine 25 MG suppository Commonly known as: PHENERGAN Place 25 mg rectally every 8 (eight) hours as needed for nausea or vomiting.   propranolol 20 MG tablet Commonly known as: INDERAL Take 1 tablet (20 mg total) by mouth 2 (two) times daily. For anxiety/HTN   QUEtiapine 200 MG tablet Commonly known as: SEROQUEL Take 2 tablets (400 mg total) by mouth at bedtime.   topiramate 25 MG tablet Commonly known as: TOPAMAX Take 1 tablet (25 mg total) by mouth 2 (two) times daily. For seizure activities       Allergies  Allergen Reactions  . Ibuprofen Other (See Comments)    GI MD said to not take this anymore  . Oatmeal Rash    Time coordinating discharge: Over 30 minutes   SIGNED:   Jae Dire, D.O. [facility] Pager:  [phone]  01/29/2019, 1:48 PM

## 2019-02-09 IMAGING — DX DG CHEST 2V
2 series · 2 of 2 positions shown · non-contrast
Comparison: 01/29/2018.

CLINICAL DATA: Chest pain.  Nominal pain.  Syncope.

EXAM:
CHEST - 2 VIEW

[w chest pa]
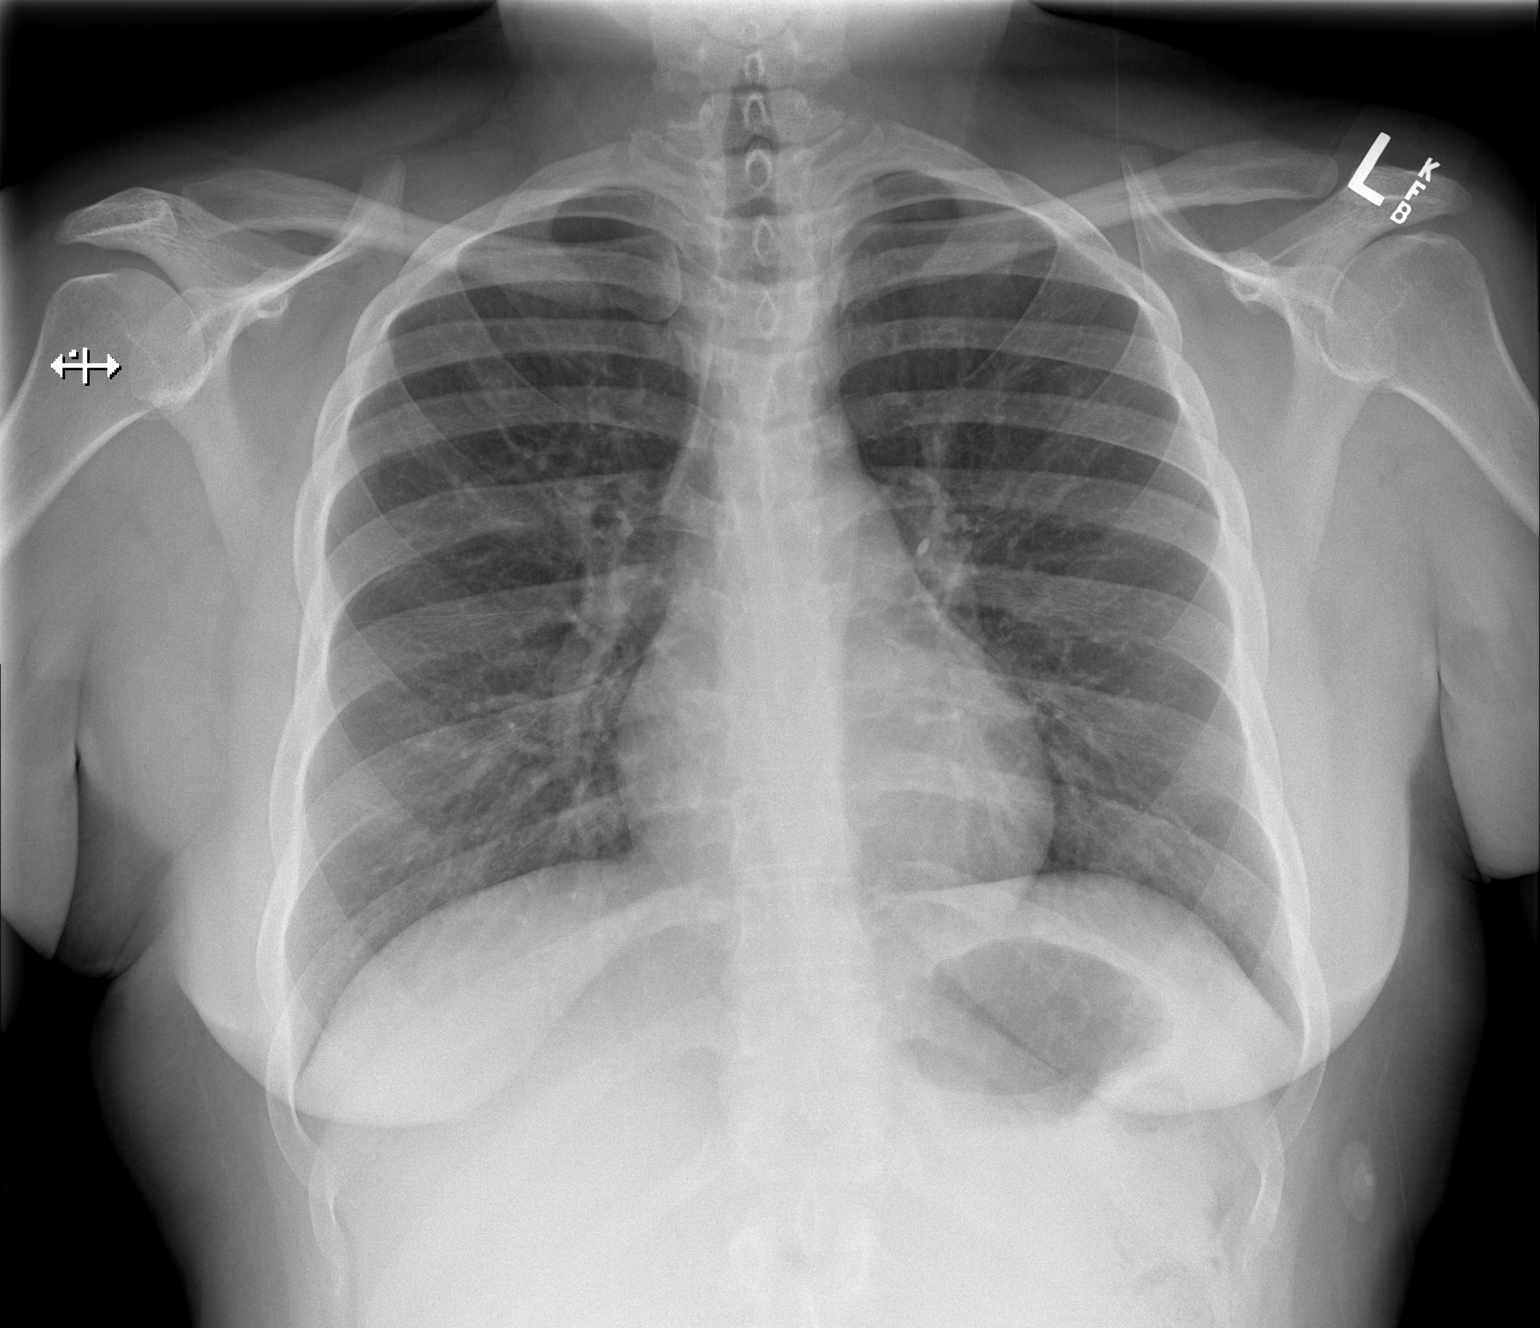

[w chest lat]
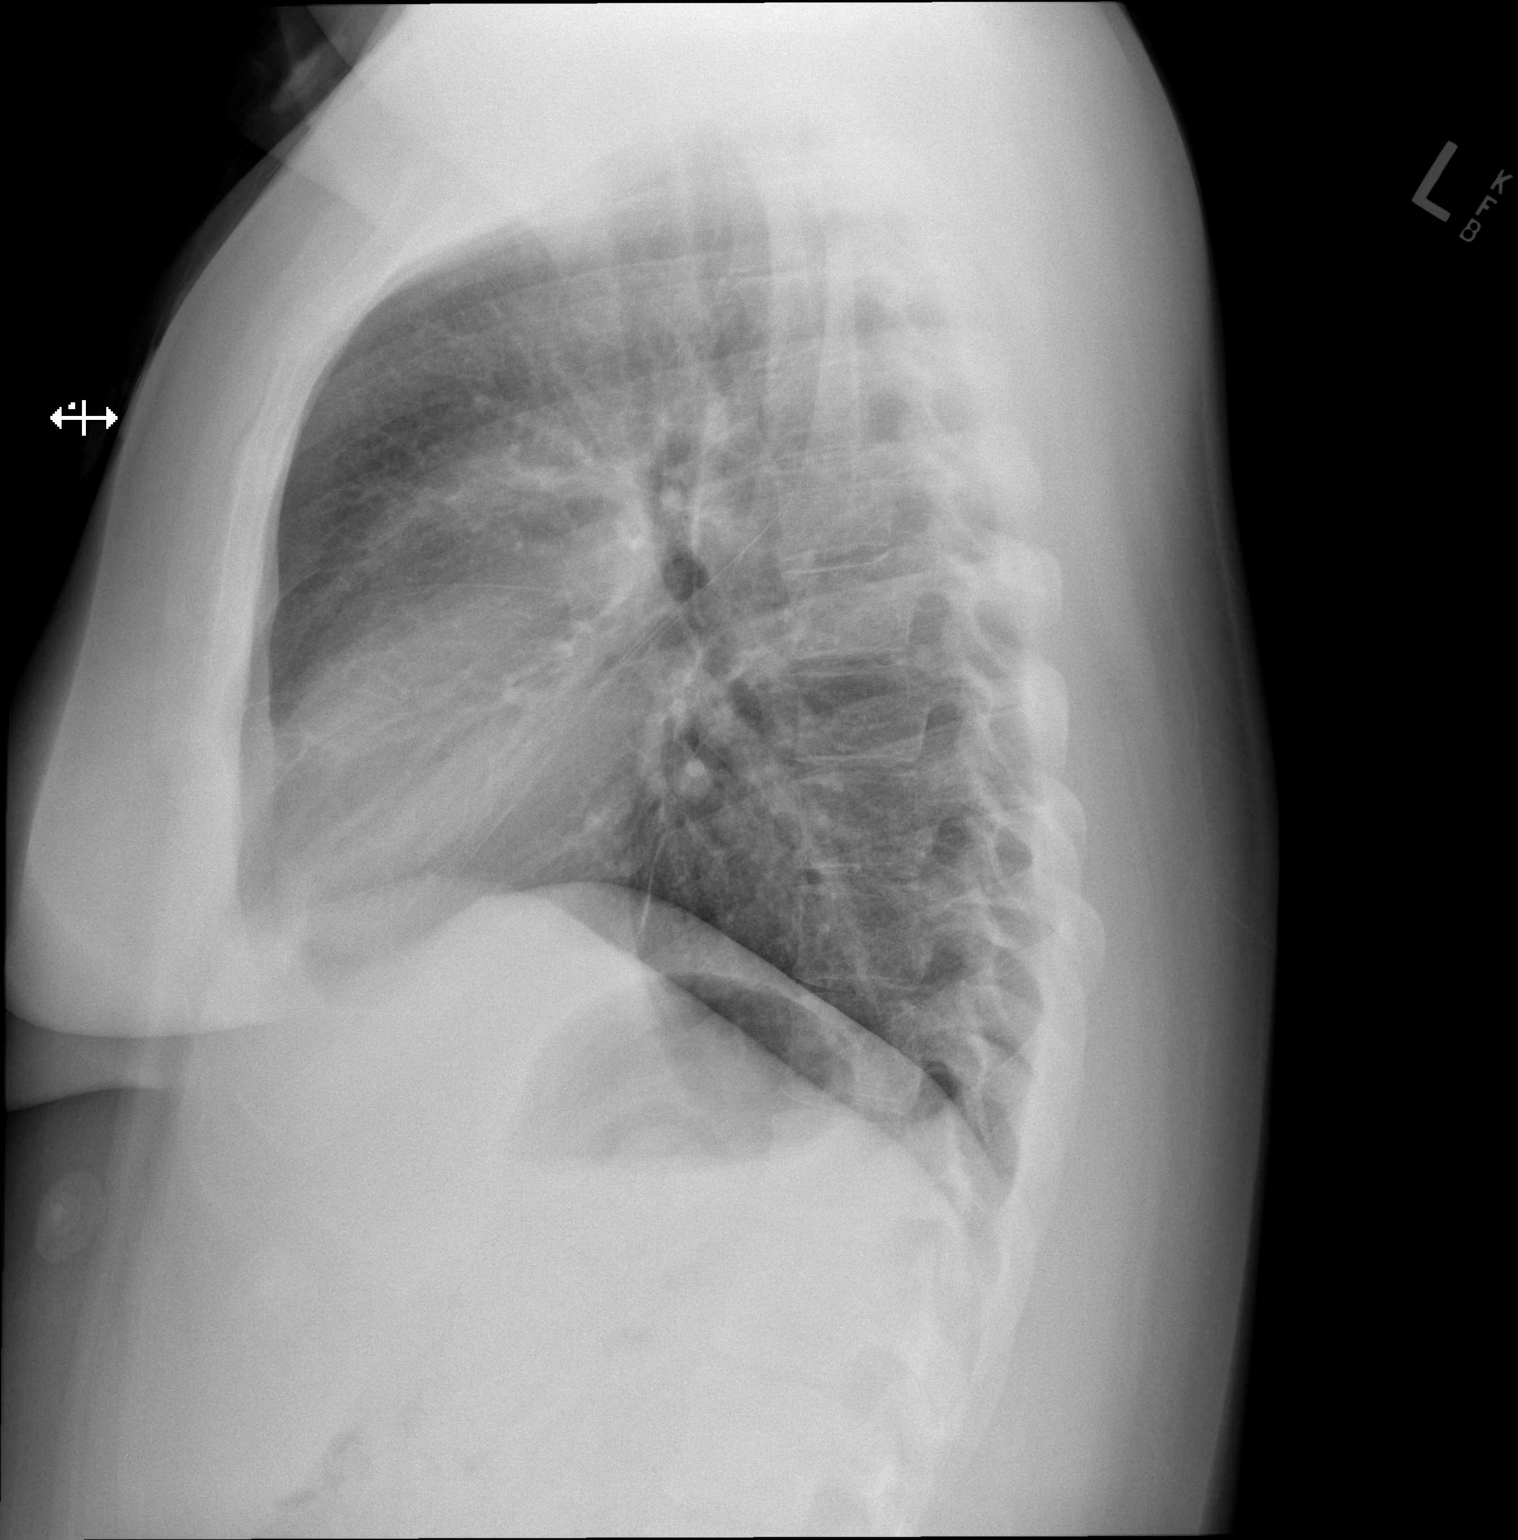

[2 of 2 positions shown; findings below may reference images not displayed]

FINDINGS: Mediastinum and hilar structures normal. Low lung volumes. No focal
infiltrate. No pleural effusion pneumothorax. Heart size normal. No
acute bony abnormality.
IMPRESSION: No acute cardiopulmonary disease.

## 2019-02-22 ENCOUNTER — Ambulatory Visit: Payer: Medicaid Other | Admitting: Gastroenterology

## 2019-02-26 ENCOUNTER — Emergency Department (HOSPITAL_COMMUNITY): Payer: Medicaid Other

## 2019-02-26 ENCOUNTER — Inpatient Hospital Stay (HOSPITAL_COMMUNITY)
Admission: EM | Admit: 2019-02-26 | Discharge: 2019-02-28 | DRG: 638 | Disposition: A | Discharge status: Home / Self Care | Payer: Medicaid Other | Attending: Family Medicine | Admitting: Family Medicine

## 2019-02-26 ENCOUNTER — Encounter (HOSPITAL_COMMUNITY): Payer: Self-pay

## 2019-02-26 DIAGNOSIS — G8929 Other chronic pain: Secondary | ICD-10-CM | POA: Diagnosis present

## 2019-02-26 DIAGNOSIS — G40909 Epilepsy, unspecified, not intractable, without status epilepticus: Secondary | ICD-10-CM | POA: Diagnosis present

## 2019-02-26 DIAGNOSIS — Z79899 Other long term (current) drug therapy: Secondary | ICD-10-CM

## 2019-02-26 DIAGNOSIS — E1165 Type 2 diabetes mellitus with hyperglycemia: Secondary | ICD-10-CM

## 2019-02-26 DIAGNOSIS — E860 Dehydration: Secondary | ICD-10-CM

## 2019-02-26 DIAGNOSIS — K920 Hematemesis: Secondary | ICD-10-CM

## 2019-02-26 DIAGNOSIS — Z833 Family history of diabetes mellitus: Secondary | ICD-10-CM

## 2019-02-26 DIAGNOSIS — Z9119 Patient's noncompliance with other medical treatment and regimen: Secondary | ICD-10-CM

## 2019-02-26 DIAGNOSIS — R109 Unspecified abdominal pain: Secondary | ICD-10-CM | POA: Diagnosis present

## 2019-02-26 DIAGNOSIS — L03818 Cellulitis of other sites: Secondary | ICD-10-CM

## 2019-02-26 DIAGNOSIS — Z794 Long term (current) use of insulin: Secondary | ICD-10-CM

## 2019-02-26 DIAGNOSIS — K5900 Constipation, unspecified: Secondary | ICD-10-CM | POA: Diagnosis present

## 2019-02-26 DIAGNOSIS — E1110 Type 2 diabetes mellitus with ketoacidosis without coma: Secondary | ICD-10-CM | POA: Diagnosis present

## 2019-02-26 DIAGNOSIS — F445 Conversion disorder with seizures or convulsions: Secondary | ICD-10-CM | POA: Diagnosis not present

## 2019-02-26 DIAGNOSIS — F332 Major depressive disorder, recurrent severe without psychotic features: Secondary | ICD-10-CM | POA: Diagnosis not present

## 2019-02-26 DIAGNOSIS — R739 Hyperglycemia, unspecified: Secondary | ICD-10-CM

## 2019-02-26 DIAGNOSIS — E1010 Type 1 diabetes mellitus with ketoacidosis without coma: Secondary | ICD-10-CM | POA: Diagnosis not present

## 2019-02-26 DIAGNOSIS — Z20822 Contact with and (suspected) exposure to covid-19: Secondary | ICD-10-CM | POA: Diagnosis present

## 2019-02-26 DIAGNOSIS — F4322 Adjustment disorder with anxiety: Secondary | ICD-10-CM | POA: Diagnosis present

## 2019-02-26 DIAGNOSIS — L03314 Cellulitis of groin: Secondary | ICD-10-CM | POA: Diagnosis present

## 2019-02-26 DIAGNOSIS — L0390 Cellulitis, unspecified: Secondary | ICD-10-CM | POA: Insufficient documentation

## 2019-02-26 DIAGNOSIS — F4320 Adjustment disorder, unspecified: Secondary | ICD-10-CM | POA: Diagnosis present

## 2019-02-26 LAB — CBC
HCT: 43.8 % (ref 36.0–46.0)
Hemoglobin: 13.7 g/dL (ref 12.0–15.0)
MCH: 25.6 pg — ABNORMAL LOW (ref 26.0–34.0)
MCHC: 31.3 g/dL (ref 30.0–36.0)
MCV: 81.9 fL (ref 80.0–100.0)
Platelets: 366 10*3/uL (ref 150–400)
RBC: 5.35 MIL/uL — ABNORMAL HIGH (ref 3.87–5.11)
RDW: 12.9 % (ref 11.5–15.5)
WBC: 8.2 10*3/uL (ref 4.0–10.5)
nRBC: 0 % (ref 0.0–0.2)

## 2019-02-26 LAB — COMPREHENSIVE METABOLIC PANEL
ALT: 25 U/L (ref 0–44)
AST: 25 U/L (ref 15–41)
Albumin: 3.5 g/dL (ref 3.5–5.0)
Alkaline Phosphatase: 115 U/L (ref 38–126)
Anion gap: 17 — ABNORMAL HIGH (ref 5–15)
BUN: 7 mg/dL (ref 6–20)
CO2: 19 mmol/L — ABNORMAL LOW (ref 22–32)
Calcium: 9 mg/dL (ref 8.9–10.3)
Chloride: 97 mmol/L — ABNORMAL LOW (ref 98–111)
Creatinine, Ser: 0.87 mg/dL (ref 0.44–1.00)
GFR calc Af Amer: >60 mL/min (ref >60)
GFR calc non Af Amer: >60 mL/min (ref >60)
Glucose, Bld: 608 mg/dL (ref 70–99)
Potassium: 4.1 mmol/L (ref 3.5–5.1)
Sodium: 133 mmol/L — ABNORMAL LOW (ref 135–145)
Total Bilirubin: 0.6 mg/dL (ref 0.3–1.2)
Total Protein: 6.8 g/dL (ref 6.5–8.1)

## 2019-02-26 LAB — POCT I-STAT EG7
Acid-base deficit: 4 mmol/L — ABNORMAL HIGH (ref 0.0–2.0)
Acid-base deficit: 1 mmol/L (ref 0.0–2.0)
Bicarbonate: 21.2 mmol/L (ref 20.0–28.0)
Bicarbonate: 24.6 mmol/L (ref 20.0–28.0)
Calcium, Ion: 1.12 mmol/L — ABNORMAL LOW (ref 1.15–1.40)
Calcium, Ion: 1.13 mmol/L — ABNORMAL LOW (ref 1.15–1.40)
HCT: 45 % (ref 36.0–46.0)
HCT: 43 % (ref 36.0–46.0)
Hemoglobin: 15.3 g/dL — ABNORMAL HIGH (ref 12.0–15.0)
Hemoglobin: 14.6 g/dL (ref 12.0–15.0)
O2 Saturation: 91 %
O2 Saturation: 72 %
Patient temperature: 98.5
Potassium: 4 mmol/L (ref 3.5–5.1)
Potassium: 3.7 mmol/L (ref 3.5–5.1)
Sodium: 132 mmol/L — ABNORMAL LOW (ref 135–145)
Sodium: 135 mmol/L (ref 135–145)
TCO2: 22 mmol/L (ref 22–32)
TCO2: 26 mmol/L (ref 22–32)
pCO2, Ven: 37.1 mmHg — ABNORMAL LOW (ref 44.0–60.0)
pCO2, Ven: 43.8 mmHg — ABNORMAL LOW (ref 44.0–60.0)
pH, Ven: 7.366 (ref 7.250–7.430)
pH, Ven: 7.357 (ref 7.250–7.430)
pO2, Ven: 64 mmHg — ABNORMAL HIGH (ref 32.0–45.0)
pO2, Ven: 40 mmHg (ref 32.0–45.0)

## 2019-02-26 LAB — LACTIC ACID, PLASMA
Lactic Acid, Venous: 6.8 mmol/L (ref 0.5–1.9)
Lactic Acid, Venous: 2.4 mmol/L (ref 0.5–1.9)

## 2019-02-26 LAB — CBG MONITORING, ED
Glucose-Capillary: 535 mg/dL (ref 70–99)
Glucose-Capillary: 429 mg/dL — ABNORMAL HIGH (ref 70–99)
Glucose-Capillary: 327 mg/dL — ABNORMAL HIGH (ref 70–99)
Glucose-Capillary: 298 mg/dL — ABNORMAL HIGH (ref 70–99)

## 2019-02-26 LAB — LIPASE, BLOOD: Lipase: 20 U/L (ref 11–51)

## 2019-02-26 LAB — TYPE AND SCREEN
ABO/RH(D): A POS
Antibody Screen: NEGATIVE

## 2019-02-26 LAB — I-STAT BETA HCG BLOOD, ED (MC, WL, AP ONLY): I-stat hCG, quantitative: <5 m[IU]/mL (ref <5)

## 2019-02-26 MED ORDER — QUETIAPINE FUMARATE 100 MG PO TABS
400.0000 mg | ORAL_TABLET | Freq: Every day | ORAL | Status: DC
  Administered 2019-02-27 (×2): 400 mg via ORAL
  Filled 2019-02-26: qty 4
  Filled 2019-02-26: qty 1
  Filled 2019-02-26: qty 4

## 2019-02-26 MED ORDER — HYDROMORPHONE HCL 1 MG/ML IJ SOLN
1.0000 mg | Freq: Once | INTRAMUSCULAR | Status: AC
  Administered 2019-02-26: 21:00:00 1 mg via INTRAVENOUS
  Filled 2019-02-26: qty 1

## 2019-02-26 MED ORDER — DEXTROSE-NACL 5-0.45 % IV SOLN: INTRAVENOUS | Status: DC

## 2019-02-26 MED ORDER — PANTOPRAZOLE SODIUM 40 MG IV SOLR
40.0000 mg | Freq: Once | INTRAVENOUS | Status: AC
  Administered 2019-02-27: 02:00:00 40 mg via INTRAVENOUS
  Filled 2019-02-26 (×2): qty 40

## 2019-02-26 MED ORDER — BUSPIRONE HCL 10 MG PO TABS
10.0000 mg | ORAL_TABLET | Freq: Three times a day (TID) | ORAL | Status: DC
  Administered 2019-02-27 – 2019-02-28 (×5): 10 mg via ORAL
  Filled 2019-02-26 (×5): qty 1

## 2019-02-26 MED ORDER — HALOPERIDOL LACTATE 5 MG/ML IJ SOLN
2.0000 mg | Freq: Once | INTRAMUSCULAR | Status: AC
  Administered 2019-02-26: 2 mg via INTRAVENOUS
  Filled 2019-02-26: qty 1

## 2019-02-26 MED ORDER — POTASSIUM CHLORIDE 10 MEQ/100ML IV SOLN
10.0000 meq | INTRAVENOUS | Status: AC
  Administered 2019-02-26 (×2): 10 meq via INTRAVENOUS
  Filled 2019-02-26 (×2): qty 100

## 2019-02-26 MED ORDER — DEXTROSE 50 % IV SOLN: 0.0000 mL | INTRAVENOUS | Status: DC | PRN

## 2019-02-26 MED ORDER — IOHEXOL 300 MG/ML  SOLN
100.0000 mL | Freq: Once | INTRAMUSCULAR | Status: AC | PRN
  Administered 2019-02-26: 22:00:00 100 mL via INTRAVENOUS

## 2019-02-26 MED ORDER — LAMOTRIGINE 25 MG PO TABS
50.0000 mg | ORAL_TABLET | Freq: Two times a day (BID) | ORAL | Status: DC
  Administered 2019-02-27 – 2019-02-28 (×4): 50 mg via ORAL
  Filled 2019-02-26 (×6): qty 2

## 2019-02-26 MED ORDER — SULFAMETHOXAZOLE-TRIMETHOPRIM 800-160 MG PO TABS
1.0000 | ORAL_TABLET | Freq: Two times a day (BID) | ORAL | Status: DC
  Administered 2019-02-27 – 2019-02-28 (×3): 1 via ORAL
  Filled 2019-02-26 (×3): qty 1

## 2019-02-26 MED ORDER — SODIUM CHLORIDE 0.9 % IV BOLUS
1000.0000 mL | Freq: Once | INTRAVENOUS | Status: AC
  Administered 2019-02-26: 20:00:00 1000 mL via INTRAVENOUS

## 2019-02-26 MED ORDER — SULFAMETHOXAZOLE-TRIMETHOPRIM 800-160 MG PO TABS: 2.0000 | ORAL_TABLET | Freq: Two times a day (BID) | ORAL | Status: DC

## 2019-02-26 MED ORDER — ACETAMINOPHEN 325 MG PO TABS: 650.0000 mg | ORAL_TABLET | Freq: Four times a day (QID) | ORAL | Status: DC | PRN

## 2019-02-26 MED ORDER — HYDROXYZINE HCL 25 MG PO TABS: 50.0000 mg | ORAL_TABLET | Freq: Three times a day (TID) | ORAL | Status: DC | PRN

## 2019-02-26 MED ORDER — PROMETHAZINE HCL 25 MG/ML IJ SOLN: 12.5000 mg | Freq: Four times a day (QID) | INTRAMUSCULAR | Status: DC | PRN

## 2019-02-26 MED ORDER — HYDROCODONE-ACETAMINOPHEN 5-325 MG PO TABS
1.0000 | ORAL_TABLET | ORAL | Status: DC | PRN
  Administered 2019-02-27: 01:00:00 1 via ORAL
  Administered 2019-02-27 – 2019-02-28 (×5): 2 via ORAL
  Filled 2019-02-26: qty 2
  Filled 2019-02-26: qty 1
  Filled 2019-02-26 (×5): qty 2

## 2019-02-26 MED ORDER — PROPRANOLOL HCL 40 MG PO TABS
20.0000 mg | ORAL_TABLET | Freq: Two times a day (BID) | ORAL | Status: DC
  Administered 2019-02-27 – 2019-02-28 (×4): 20 mg via ORAL
  Filled 2019-02-26 (×6): qty 1

## 2019-02-26 MED ORDER — TOPIRAMATE 25 MG PO TABS
25.0000 mg | ORAL_TABLET | Freq: Two times a day (BID) | ORAL | Status: DC
  Administered 2019-02-27 – 2019-02-28 (×4): 25 mg via ORAL
  Filled 2019-02-26 (×5): qty 1

## 2019-02-26 MED ORDER — INSULIN REGULAR(HUMAN) IN NACL 100-0.9 UT/100ML-% IV SOLN
INTRAVENOUS | Status: DC
  Administered 2019-02-26: 21:00:00 13 [IU]/h via INTRAVENOUS
  Filled 2019-02-26: qty 100

## 2019-02-26 MED ORDER — SODIUM CHLORIDE 0.9 % IV SOLN: INTRAVENOUS | Status: DC

## 2019-02-26 MED ORDER — MIRTAZAPINE 15 MG PO TABS
7.5000 mg | ORAL_TABLET | Freq: Every day | ORAL | Status: DC
  Administered 2019-02-27 (×2): 7.5 mg via ORAL
  Filled 2019-02-26 (×3): qty 1

## 2019-02-26 MED ORDER — LUBIPROSTONE 24 MCG PO CAPS
24.0000 ug | ORAL_CAPSULE | Freq: Two times a day (BID) | ORAL | Status: DC
  Administered 2019-02-27 – 2019-02-28 (×3): 24 ug via ORAL
  Filled 2019-02-26 (×5): qty 1

## 2019-02-26 MED ORDER — PANTOPRAZOLE SODIUM 40 MG PO TBEC
40.0000 mg | DELAYED_RELEASE_TABLET | Freq: Two times a day (BID) | ORAL | Status: DC
  Administered 2019-02-27 – 2019-02-28 (×3): 40 mg via ORAL
  Filled 2019-02-26 (×4): qty 1

## 2019-02-26 MED ORDER — PANTOPRAZOLE SODIUM 40 MG IV SOLR: 40.0000 mg | INTRAVENOUS | Status: DC

## 2019-02-26 MED ORDER — CEPHALEXIN 250 MG PO CAPS: 500.0000 mg | ORAL_CAPSULE | Freq: Four times a day (QID) | ORAL | Status: DC

## 2019-02-26 NOTE — ED Triage Notes (Signed)
Pt has arrived to ED via [facility] due to her stating that she has been having "multiple seizures" (witnessed by her mother) and vomiting blood for about a "week and a half" (per pt). Upon arrival pt is A/Ox4, NOT postictal, & she is tachy on the monitor in the 130's. Pt states she has a Hx of DM, CHF, and seizures.

## 2019-02-26 NOTE — H&P (Signed)
History and Physical    Nancy [name] NGE:[id number] DOB: 11/03/2000 DOA: 02/26/2019  PCP: Fleet Contras, MD   Patient coming from: Home   Chief Complaint: N/V, blood in vomit, multiple seizure-like episodes, abdominal pain   HPI: Nancy [name] is a 19 y.o. female with medical history significant for poorly controlled insulin-dependent diabetes mellitus, chronic abdominal and chest pain, seizures and/or pseudoseizures, depression with anxiety, and poor adherence with her treatment plan, now presenting to the emergency department with nausea, vomiting, abdominal pain, and multiple seizures at home.  Patient reports that she has had increase in her chronic abdominal pain, nausea, and vomiting for the past 1.5 weeks.  She then sought a small amount of red blood in her vomit today, reports having several seizures at home without any significant injury, and comes in for evaluation of that.  She also reports a recurrent boil near the left groin that she lanced a couple days ago but continues to have some mild erythema and tenderness there.  She denies any fevers or chills, denies cough, denies shortness of breath, sore throat, or loss of taste or smell.  ED Course: Upon arrival to the ED, patient is found to be afebrile, saturating well on room air, tachycardic to 130, and with blood pressure 99/59.  Chest x-ray is negative for acute cardiopulmonary disease and CT of the abdomen and pelvis is negative for acute intra-abdominal or pelvic abnormality but notable for possible mild soft tissue infection superior to the left labia majora.  Chemistry panel with glucose 608, bicarbonate 19, and anion gap 17.  CBC is unremarkable.  Initial lactic acid 6.8, down to 2.4 with IV fluids.  Type and screen was performed, Covid PCR screening test is pending, urinalysis pending, and the patient was treated with 2 L normal saline, 20 mEq IV potassium, Haldol, and Dilaudid in the ED.  Review of Systems:  All other  systems reviewed and apart from HPI, are negative.  Past Medical History:  Diagnosis Date  . Acanthosis nigricans   . Anxiety   . CHF (congestive heart failure) (HCC)   . Chronic lower back pain   . Depression   . DKA, type 1 (HCC) 09/13/2018  . Dyspepsia   . Obesity   . Ovarian cyst    pt is not aware of this hx (11/24/2017)  . 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 Premature baby   . Seizures (HCC)   . Type II diabetes mellitus (HCC)    insulin dependant    Past Surgical History:  Procedure Laterality Date  . ABDOMINAL HERNIA REPAIR     "I was a baby"  . BIOPSY  10/12/2018   Procedure: BIOPSY;  Surgeon: Lynann Bologna, MD;  Location: [facility] ENDOSCOPY;  Service: Endoscopy;;  . ESOPHAGOGASTRODUODENOSCOPY (EGD) WITH PROPOFOL N/A 10/12/2018   Procedure: ESOPHAGOGASTRODUODENOSCOPY (EGD) WITH PROPOFOL;  Surgeon: Lynann Bologna, MD;  Location: [facility] ENDOSCOPY;  Service: Endoscopy;  Laterality: N/A;  . HERNIA REPAIR    . LEFT HEART CATH AND CORONARY ANGIOGRAPHY N/A 10/13/2018   Procedure: LEFT HEART CATH AND CORONARY ANGIOGRAPHY;  Surgeon: Kathleene Hazel, MD;  Location: [facility] INVASIVE CV LAB;  Service: Cardiovascular;  Laterality: N/A;  . TONSILLECTOMY AND ADENOIDECTOMY    . WISDOM TOOTH EXTRACTION  2017     reports that she has never smoked. She has never used smokeless tobacco. She reports that she does not drink alcohol or use drugs.  Allergies  Allergen Reactions  . Ibuprofen Other (See Comments)  GI MD said to not take this anymore  . Oatmeal Rash    Family History  Problem Relation Age of Onset  . Diabetes Mother   . Hypertension Mother   . Obesity Mother   . Asthma Mother   . Allergic rhinitis Mother   . Eczema Mother   . Cervical cancer Mother   . Diabetes Father   . Hypertension Father   . Obesity Father   . Hyperlipidemia Father   . Hypertension Paternal Aunt   . Hypertension Maternal Grandfather   . Colon cancer Maternal Grandfather   . Diabetes Paternal  Grandmother   . Obesity Paternal Grandmother   . Diabetes Paternal Grandfather   . Obesity Paternal Grandfather   . Angioedema Neg Hx   . Immunodeficiency Neg Hx   . Urticaria Neg Hx   . Stomach cancer Neg Hx   . Esophageal cancer Neg Hx      Prior to Admission medications   Medication Sig Start Date End Date Taking? Authorizing Provider  atorvastatin (LIPITOR) 20 MG tablet Take 1 tablet (20 mg total) by mouth daily at 6 PM. 10/05/18  Yes Storm Frisk, MD  bisacodyl (DULCOLAX) 10 MG suppository Place 1 suppository (10 mg total) rectally daily. 12/24/18  Yes Swayze, Ava, DO  busPIRone (BUSPAR) 10 MG tablet Take 10 mg by mouth 3 (three) times daily.  11/19/18  Yes [provider]  cycloSPORINE (RESTASIS) 0.05 % ophthalmic emulsion Place 1 drop into both eyes 2 (two) times daily.   Yes [provider]  hydrOXYzine (ATARAX/VISTARIL) 50 MG tablet Take 1 tablet (50 mg total) by mouth 3 (three) times daily as needed for anxiety. Patient taking differently: Take 50 mg by mouth 3 (three) times daily.  11/11/18  Yes Armandina Stammer I, NP  insulin glargine (LANTUS) 100 UNIT/ML injection Inject 0.6 mLs (60 Units total) into the skin at bedtime. For diabetes management Patient taking differently: Inject 70 Units into the skin at bedtime. For diabetes management 11/11/18  Yes Armandina Stammer I, NP  insulin lispro (HUMALOG) 100 UNIT/ML injection Inject 0.1 mLs (10 Units total) into the skin 3 (three) times daily with meals. Patient taking differently: Inject 10 Units into the skin in the morning and at bedtime.  01/29/19  Yes Jae Dire, MD  lamoTRIgine (LAMICTAL) 25 MG tablet Take 2 tablets (50 mg total) by mouth 2 (two) times daily. For mood stabilization 11/11/18  Yes Nwoko, Nicole Kindred I, NP  lubiprostone (AMITIZA) 24 MCG capsule Take 1 capsule (24 mcg total) by mouth 2 (two) times daily with a meal. 10/27/18  Yes Esterwood, Amy S, PA-C  metoCLOPramide (REGLAN) 10 MG tablet Take 1 tablet (10  mg total) by mouth 4 (four) times daily -  before meals and at bedtime. Patient taking differently: Take 10 mg by mouth every 6 (six) hours as needed for nausea or vomiting.  11/30/18 01/27/28 Yes Jerald Kief, MD  mirtazapine (REMERON) 7.5 MG tablet Take 1 tablet (7.5 mg total) by mouth at bedtime. For depression/sleep 11/11/18  Yes Armandina Stammer I, NP  Multiple Vitamin (MULTIVITAMIN WITH MINERALS) TABS tablet Take 1 tablet by mouth daily. 09/18/18  Yes Clapacs, Jackquline Denmark, MD  Olopatadine HCl (PAZEO) 0.7 % SOLN Place 2 drops into both eyes 2 (two) times daily.    Yes [provider]  pantoprazole (PROTONIX) 40 MG tablet Take 1 tablet (40 mg total) by mouth 2 (two) times daily. For acid reflux 01/29/19  Yes Jae Dire, MD  polyethylene glycol (MIRALAX / GLYCOLAX) 17 g packet Take 17 g by mouth daily. 12/24/18  Yes Swayze, Ava, DO  prochlorperazine (COMPAZINE) 25 MG suppository Place 1 suppository (25 mg total) rectally every 12 (twelve) hours as needed for nausea or vomiting. 12/05/18  Yes Melene Plan, DO  promethazine (PHENERGAN) 25 MG suppository Place 25 mg rectally every 8 (eight) hours as needed for nausea or vomiting.   Yes [provider]  propranolol (INDERAL) 20 MG tablet Take 1 tablet (20 mg total) by mouth 2 (two) times daily. For anxiety/HTN 12/23/18  Yes Swayze, Ava, DO  QUEtiapine (SEROQUEL) 200 MG tablet Take 2 tablets (400 mg total) by mouth at bedtime. 01/15/19  Yes Gwyneth Sprout, MD  topiramate (TOPAMAX) 25 MG tablet Take 1 tablet (25 mg total) by mouth 2 (two) times daily. For seizure activities 11/11/18  Yes Armandina Stammer I, NP  lactulose (CHRONULAC) 10 GM/15ML solution Take 30 mLs (20 g total) by mouth 3 (three) times daily. Patient not taking: Reported on 02/26/2019 12/23/18   Fran Lowes, DO    Physical Exam: Vitals:   02/26/19 1834 02/26/19 1845 02/26/19 1900 02/26/19 2130  BP: 121/80 104/67 109/80 (!) 99/59  Pulse: (!) 130 (!) 132 (!) 141 (!) 129  Resp: 18  (!) 26 16 (!) 22  Temp: 98.7 F (37.1 C)     TempSrc: Oral     SpO2: 100% 100% 100% 99%     Constitutional: NAD, calm  Eyes: PERTLA, lids and conjunctivae normal ENMT: Mucous membranes are moist. Posterior pharynx clear of any exudate or lesions.   Neck: normal, supple, no masses, no thyromegaly Respiratory: clear to auscultation bilaterally, no wheezing, no crackles. No accessory muscle use.  Cardiovascular: Rate ~120 and regular. No extremity edema.  Abdomen: No distension, soft, generally tender without rebound pain or guarding. Bowel sounds active.  Musculoskeletal: no clubbing / cyanosis. No joint deformity upper and lower extremities.   Skin: no significant rashes, lesions, ulcers. Warm, dry, well-perfused. Neurologic: No facial asymmetry. Sensation intact. Moving all extremities.  Psychiatric: Alert and oriented to person, place, and situation. Pleasant and cooperative.    Labs and Imaging on Admission: I have personally reviewed following labs and imaging studies  CBC: Recent Labs  Lab 02/26/19 1918 02/26/19 1928 02/26/19 2101  WBC  --  8.2  --   HGB 15.3* 13.7 14.6  HCT 45.0 43.8 43.0  MCV  --  81.9  --   PLT  --  366  --    Basic Metabolic Panel: Recent Labs  Lab 02/26/19 1918 02/26/19 1928 02/26/19 2101  NA 132* 133* 135  K 4.0 4.1 3.7  CL  --  97*  --   CO2  --  19*  --   GLUCOSE  --  608*  --   BUN  --  7  --   CREATININE  --  0.87  --   CALCIUM  --  9.0  --    GFR: CrCl cannot be calculated (Unknown ideal weight.). Liver Function Tests: Recent Labs  Lab 02/26/19 1928  AST 25  ALT 25  ALKPHOS 115  BILITOT 0.6  PROT 6.8  ALBUMIN 3.5   Recent Labs  Lab 02/26/19 1928  LIPASE 20   No results for input(s): AMMONIA in the last 168 hours. Coagulation Profile: No results for input(s): INR, PROTIME in the last 168 hours. Cardiac Enzymes: No results for input(s): CKTOTAL, CKMB, CKMBINDEX, TROPONINI in the last 168 hours. BNP (last 3  results) No results for input(s): PROBNP in the last 8760 hours. HbA1C: No results for input(s): HGBA1C in the last 72 hours. CBG: Recent Labs  Lab 02/26/19 1950 02/26/19 2050 02/26/19 2206  GLUCAP 535* 429* 327*   Lipid Profile: No results for input(s): CHOL, HDL, LDLCALC, TRIG, CHOLHDL, LDLDIRECT in the last 72 hours. Thyroid Function Tests: No results for input(s): TSH, T4TOTAL, FREET4, T3FREE, THYROIDAB in the last 72 hours. Anemia Panel: No results for input(s): VITAMINB12, FOLATE, FERRITIN, TIBC, IRON, RETICCTPCT in the last 72 hours. Urine analysis:    Component Value Date/Time   COLORURINE YELLOW 01/27/2019 0301   APPEARANCEUR HAZY (A) 01/27/2019 0301   LABSPEC 1.032 (H) 01/27/2019 0301   PHURINE 5.0 01/27/2019 0301   GLUCOSEU >=500 (A) 01/27/2019 0301   HGBUR LARGE (A) 01/27/2019 0301   BILIRUBINUR NEGATIVE 01/27/2019 0301   BILIRUBINUR negative 07/13/2018 1651   KETONESUR 80 (A) 01/27/2019 0301   PROTEINUR 30 (A) 01/27/2019 0301   UROBILINOGEN 0.2 07/13/2018 1651   UROBILINOGEN 1.0 07/09/2017 1324   NITRITE NEGATIVE 01/27/2019 0301   LEUKOCYTESUR NEGATIVE 01/27/2019 0301   Sepsis Labs: @LABRCNTIP (procalcitonin:4,lacticidven:4) ) Recent Results (from the past 240 hour(s))  Culture, blood (single) w Reflex to ID Panel     Status: None (Preliminary result)   Collection Time: 02/26/19  6:57 PM   Specimen: BLOOD  Result Value Ref Range Status   Specimen Description BLOOD RIGHT ANTECUBITAL  Final   Special Requests   Final    BOTTLES DRAWN AEROBIC ONLY Blood Culture results may not be optimal due to an inadequate volume of blood received in culture bottles Performed at [facility], [address], [place], [place]    Culture PENDING  Incomplete   Report Status PENDING  Incomplete     Radiological Exams on Admission: CT ABDOMEN PELVIS W CONTRAST  Result Date: 02/26/2019 CLINICAL DATA:  Abdominal pain and hematemesis. EXAM: CT ABDOMEN AND  PELVIS WITH CONTRAST TECHNIQUE: Multidetector CT imaging of the abdomen and pelvis was performed using the standard protocol following bolus administration of intravenous contrast. CONTRAST:  02/28/2019 OMNIPAQUE IOHEXOL 300 MG/ML  SOLN COMPARISON:  CT dated December 19, 2018. FINDINGS: Lower chest: The lung bases are clear. The heart size is normal. Hepatobiliary: The liver is normal. Normal gallbladder.There is no biliary ductal dilation. Pancreas: Normal contours without ductal dilatation. No peripancreatic fluid collection. Spleen: No splenic laceration or hematoma. Adrenals/Urinary Tract: --Adrenal glands: No adrenal hemorrhage. --Right kidney/ureter: No hydronephrosis or perinephric hematoma. --Left kidney/ureter: No hydronephrosis or perinephric hematoma. --Urinary bladder: Unremarkable. Stomach/Bowel: --Stomach/Duodenum: No hiatal hernia or other gastric abnormality. Normal duodenal course and caliber. --Small bowel: No dilatation or inflammation. --Colon: No focal abnormality. --Appendix: Normal. Vascular/Lymphatic: Normal course and caliber of the major abdominal vessels. --No retroperitoneal lymphadenopathy. --No mesenteric lymphadenopathy. --No pelvic or inguinal lymphadenopathy. Reproductive: Superior to the left labia majora there is an area of skin thickening and subcutaneous fat induration (axial series 3, image 91). Other: No ascites or free air. There is a new nodule within the subcutaneous fat of the anterior abdominal wall on the right measuring approximately 1.5 cm (axial series 3, image 65). This is of doubtful clinical significance and may represent an injection granuloma or small subcutaneous hematoma. Musculoskeletal. No acute displaced fractures. IMPRESSION: 1. No CT evidence of acute intra-abdominal or intrapelvic abnormalities. 2. There is an area of skin thickening and subcutaneous fat stranding superior to the left labia majora. Findings favored to represent a small soft tissue infection.  Clinical correlation is recommended. Electronically Signed   By: Constance Holster M.D.   On: 02/26/2019 22:17   DG Chest Port 1 View  Result Date: 02/26/2019 CLINICAL DATA:  Chest pain. Seizure. EXAM: PORTABLE CHEST 1 VIEW COMPARISON:  01/27/2019 FINDINGS: The cardiomediastinal contours are normal. The lungs are clear. Pulmonary vasculature is normal. No consolidation, pleural effusion, or pneumothorax. No acute osseous abnormalities are seen. IMPRESSION: Negative chest radiograph. Electronically Signed   By: Keith Rake M.D.   On: 02/26/2019 19:21    Assessment/Plan   1. Uncontrolled IDDM with hyperglycemia  - Presents with numerous complaints and found to have serum glucose 608 with bicarb 19 and AG 17, may be mild DKA (UA and BHOB pending) - A1c was 11.3% in December 2020  - She was fluid-resuscitated in ED and started on insulin infusion  - Continue insulin infusion with frequent CBG's and serial chem panels and transition to sq insulin once better controlled and she is tolerating a diet   2. Abdominal pain  - Chronic, no acute findings no CT in ED     3. Seizures   - Reports multiples episodes today, hx of seizures vs pseudoseizures, no resulting injury today  - Continue antiepileptics    4. Depression, anxiety - Continue Seroquel, Buspar, Remeron, Atarax, propranolol    5. Hematemesis  - Patient reports frequent N/V over the past week or more with possible blood in vomitus tonight  - Type & screen was performed in ED, initial H&H normal  - Continue PPI, repeat CBC in am (sooner if active bleeding)  - She has GI follow-up planned   6. Cellulitis  - Patient reports recurrent boil near left groin that she lanced at home a couple days ago but continues to have some mild erythema and tenderness there  - No residual fluid-collection note on CT  - Treat with Bactrim     DVT prophylaxis: SCD's Code Status: Full  Family Communication: Discussed with patient  Disposition  Plan: Will likely return home once glucose controlled and she is tolerating a diet  Consults called: none  Admission status: Observation     Vianne Bulls, MD [facility] Pager: See [email or web address]  If 7AM-7PM, please contact the daytime attending [email or web address]  02/26/2019, 10:49 PM

## 2019-02-26 NOTE — Progress Notes (Signed)
Pharmacy Antibiotic Note  Nancy [name] is a 19 y.o. female admitted on 02/26/2019 with cellulitis.  Pharmacy has been consulted for bactrim dosing.  Plan: Bactrim ordered per MD 2 DS tablets q12 hours.  This is dose for PCP.  Pt admitted with cellulitis.  Will adjust dose to 1DS tablet bid.     Temp (24hrs), Avg:98.7 F (37.1 C), Min:98.7 F (37.1 C), Max:98.7 F (37.1 C)  Recent Labs  Lab 02/26/19 1928 02/26/19 1930 02/26/19 2052  WBC 8.2  --   --   CREATININE 0.87  --   --   LATICACIDVEN  --  6.8* 2.4*    CrCl cannot be calculated (Unknown ideal weight.).    Allergies  Allergen Reactions  . Ibuprofen Other (See Comments)    GI MD said to not take this anymore  . Oatmeal Rash     Thank you for allowing pharmacy to be a part of this patient's care.  Talbert Cage [place] 02/26/2019 11:22 PM

## 2019-02-26 NOTE — ED Provider Notes (Signed)
[facility] DEPT [facility] Emergency Department Provider Note MRN:  [id number]  Arrival date & time: 02/26/19     Chief Complaint   Seizures and Hematemesis   History of Present Illness   CASHLYNN YEARWOOD is a 19 y.o. year-old female with a history of diabetes, CHF presenting to the ED with chief complaint of seizures and hematemesis.  Patient reports over 1 week of persistent vomiting, today looked like blood.  Persistent nausea and abdominal pain, the pain is diffuse in the abdomen and constant, no exacerbating or alleviating factors.  Endorsing 8 separate seizures today witnessed by mom.  Denies shortness of breath, endorsing mild burning chest pain, no vaginal bleeding or discharge.  Review of Systems  A complete 10 system review of systems was obtained and all systems are negative except as noted in the HPI and PMH.   Patient's Health History    Past Medical History:  Diagnosis Date  . Acanthosis nigricans   . Anxiety   . CHF (congestive heart failure) (HCC)   . Chronic lower back pain   . Depression   . DKA, type 1 (HCC) 09/13/2018  . Dyspepsia   . Obesity   . Ovarian cyst    pt is not aware of this hx (11/24/2017)  . 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 Premature baby   . Seizures (HCC)   . Type II diabetes mellitus (HCC)    insulin dependant    Past Surgical History:  Procedure Laterality Date  . ABDOMINAL HERNIA REPAIR     "I was a baby"  . BIOPSY  10/12/2018   Procedure: BIOPSY;  Surgeon: Lynann Bologna, MD;  Location: [facility] ENDOSCOPY;  Service: Endoscopy;;  . ESOPHAGOGASTRODUODENOSCOPY (EGD) WITH PROPOFOL N/A 10/12/2018   Procedure: ESOPHAGOGASTRODUODENOSCOPY (EGD) WITH PROPOFOL;  Surgeon: Lynann Bologna, MD;  Location: [facility] ENDOSCOPY;  Service: Endoscopy;  Laterality: N/A;  . HERNIA REPAIR    . LEFT HEART CATH AND CORONARY ANGIOGRAPHY N/A 10/13/2018   Procedure: LEFT HEART CATH AND CORONARY ANGIOGRAPHY;  Surgeon: Kathleene Hazel, MD;  Location: [facility] INVASIVE  CV LAB;  Service: Cardiovascular;  Laterality: N/A;  . TONSILLECTOMY AND ADENOIDECTOMY    . WISDOM TOOTH EXTRACTION  2017    Family History  Problem Relation Age of Onset  . Diabetes Mother   . Hypertension Mother   . Obesity Mother   . Asthma Mother   . Allergic rhinitis Mother   . Eczema Mother   . Cervical cancer Mother   . Diabetes Father   . Hypertension Father   . Obesity Father   . Hyperlipidemia Father   . Hypertension Paternal Aunt   . Hypertension Maternal Grandfather   . Colon cancer Maternal Grandfather   . Diabetes Paternal Grandmother   . Obesity Paternal Grandmother   . Diabetes Paternal Grandfather   . Obesity Paternal Grandfather   . Angioedema Neg Hx   . Immunodeficiency Neg Hx   . Urticaria Neg Hx   . Stomach cancer Neg Hx   . Esophageal cancer Neg Hx     Social History   Socioeconomic History  . Marital status: Single    Spouse name: Not on file  . Number of children: 0  . Years of education: Not on file  . Highest education level: Not on file  Occupational History  . Occupation: Admission  Tobacco Use  . Smoking status: Never Smoker  . Smokeless tobacco: Never Used  Substance and Sexual Activity  . Alcohol use: No  Alcohol/week: 0.0 standard drinks  . Drug use: No  . Sexual activity: Not Currently  Other Topics Concern  . Not on file  Social History Narrative   Lives with mom and mom's girlfriend.   No EtOH, tobacco, Drugs   Social Determinants of Health   Financial Resource Strain:   . Difficulty of Paying Living Expenses: Not on file  Food Insecurity:   . Worried About Programme researcher, broadcasting/film/video in the Last Year: Not on file  . Ran Out of Food in the Last Year: Not on file  Transportation Needs:   . Lack of Transportation (Medical): Not on file  . Lack of Transportation (Non-Medical): Not on file  Physical Activity:   . Days of Exercise per Week: Not on file  . Minutes of Exercise per Session: Not on file  Stress:   . Feeling of  Stress : Not on file  Social Connections:   . Frequency of Communication with Friends and Family: Not on file  . Frequency of Social Gatherings with Friends and Family: Not on file  . Attends Religious Services: Not on file  . Active Member of Clubs or Organizations: Not on file  . Attends Banker Meetings: Not on file  . Marital Status: Not on file  Intimate Partner Violence:   . Fear of Current or Ex-Partner: Not on file  . Emotionally Abused: Not on file  . Physically Abused: Not on file  . Sexually Abused: Not on file     Physical Exam   Vitals:   02/26/19 1900 02/26/19 2130  BP: 109/80 (!) 99/59  Pulse: (!) 141 (!) 129  Resp: 16 (!) 22  Temp:    SpO2: 100% 99%    CONSTITUTIONAL: Well-appearing, NAD NEURO:  Alert and oriented x 3, no focal deficits EYES:  eyes equal and reactive ENT/NECK:  no LAD, no JVD CARDIO: Tachycardic rate, well-perfused, normal S1 and S2 PULM:  CTAB no wheezing or rhonchi GI/GU:  normal bowel sounds, non-distended, mild diffuse tenderness to palpation MSK/SPINE:  No gross deformities, no edema SKIN:  no rash, atraumatic PSYCH:  Appropriate speech and behavior  *Additional and/or pertinent findings included in MDM below  Diagnostic and Interventional Summary    EKG Interpretation  Date/Time:    Ventricular Rate:    PR Interval:    QRS Duration:   QT Interval:    QTC Calculation:   R Axis:     Text Interpretation:        Cardiac Monitoring Interpretation:  Labs Reviewed  CBC - Abnormal; Notable for the following components:      Result Value   RBC 5.35 (*)    MCH 25.6 (*)    All other components within normal limits  COMPREHENSIVE METABOLIC PANEL - Abnormal; Notable for the following components:   Sodium 133 (*)    Chloride 97 (*)    CO2 19 (*)    Glucose, Bld 608 (*)    Anion gap 17 (*)    All other components within normal limits  LACTIC ACID, PLASMA - Abnormal; Notable for the following components:   Lactic  Acid, Venous 6.8 (*)    All other components within normal limits  LACTIC ACID, PLASMA - Abnormal; Notable for the following components:   Lactic Acid, Venous 2.4 (*)    All other components within normal limits  POCT I-STAT EG7 - Abnormal; Notable for the following components:   pCO2, Ven 37.1 (*)    pO2, Ven 64.0 (*)  Acid-base deficit 4.0 (*)    Sodium 132 (*)    Calcium, Ion 1.12 (*)    Hemoglobin 15.3 (*)    All other components within normal limits  CBG MONITORING, ED - Abnormal; Notable for the following components:   Glucose-Capillary 535 (*)    All other components within normal limits  CBG MONITORING, ED - Abnormal; Notable for the following components:   Glucose-Capillary 429 (*)    All other components within normal limits  POCT I-STAT EG7 - Abnormal; Notable for the following components:   pCO2, Ven 43.8 (*)    Calcium, Ion 1.13 (*)    All other components within normal limits  CBG MONITORING, ED - Abnormal; Notable for the following components:   Glucose-Capillary 327 (*)    All other components within normal limits  CULTURE, BLOOD (SINGLE)  SARS CORONAVIRUS 2 (TAT 6-24 HRS)  LIPASE, BLOOD  URINALYSIS, ROUTINE W REFLEX MICROSCOPIC  I-STAT BETA HCG BLOOD, ED ([facility], [facility], [facility] ONLY)  I-STAT VENOUS BLOOD GAS, ED  TYPE AND SCREEN    CT ABDOMEN PELVIS W CONTRAST  Final Result    DG Chest Port 1 View  Final Result      Medications  insulin regular, human (MYXREDLIN) 100 units/ 100 mL infusion (12 Units/hr Intravenous Rate/Dose Change 02/26/19 2210)  0.9 %  sodium chloride infusion ( Intravenous Bolus from Bag 02/26/19 2057)  dextrose 5 %-0.45 % sodium chloride infusion ( Intravenous Hold 02/26/19 2057)  dextrose 50 % solution 0-50 mL (has no administration in time range)  potassium chloride 10 mEq in 100 mL IVPB (10 mEq Intravenous New Bag/Given 02/26/19 2055)  sodium chloride 0.9 % bolus 1,000 mL (0 mLs Intravenous Stopped 02/26/19 2022)  sodium chloride 0.9 % bolus  1,000 mL (0 mLs Intravenous Stopped 02/26/19 2022)  haloperidol lactate (HALDOL) injection 2 mg (2 mg Intravenous Given 02/26/19 1931)  HYDROmorphone (DILAUDID) injection 1 mg (1 mg Intravenous Given 02/26/19 2124)  iohexol (OMNIPAQUE) 300 MG/ML solution 100 mL (100 mLs Intravenous Contrast Given 02/26/19 2205)     Procedures  /  Critical Care .Critical Care Performed by: Sabas Sous, MD Authorized by: Sabas Sous, MD   Critical care provider statement:    Critical care time (minutes):  35   Critical care was necessary to treat or prevent imminent or life-threatening deterioration of the following conditions:  Dehydration (Diabetic ketoacidosis)   Critical care was time spent personally by me on the following activities:  Discussions with consultants, evaluation of patient's response to treatment, examination of patient, ordering and performing treatments and interventions, ordering and review of laboratory studies, ordering and review of radiographic studies, pulse oximetry, re-evaluation of patient's condition, obtaining history from patient or surrogate and review of old charts    ED Course and Medical Decision Making  I have reviewed the triage vital signs, the nursing notes, and pertinent available records from the EMR.  Pertinent labs & imaging results that were available during my care of the patient were reviewed by me and considered in my medical decision making (see below for details).     Patient arrives well-appearing but tachycardic between 130 and 140, diffuse abdominal tenderness, mildly tender, reporting multiple episodes of hematemesis, multiple seizures today.  Denies alcohol or drug use, unclear reason why patient would have hematemesis other than Mallory-Weiss tear in the setting of gastroparesis or cyclical vomiting or DKA.  Work-up is pending.  10:23 PM update: Initial lactate is markedly elevated at 6.8, improving with  fluids as is patient's heart rate.  Labs  reveal mild base deficit with elevated anion gap, could be related to the lactate or DKA.  CT is without surgical process, will admit to hospital service.  Barth Kirks. Sedonia Small, MD [place] [email or web address]  Final Clinical Impressions(s) / ED Diagnoses     ICD-10-CM   1. Dehydration  E86.0   2. Diabetic ketoacidosis without coma associated with type 2 diabetes mellitus ([place])  E11.10     ED Discharge Orders    None       Discharge Instructions Discussed with and Provided to Patient:   Discharge Instructions   None       Maudie Flakes, MD 02/26/19 2224

## 2019-02-26 NOTE — ED Notes (Signed)
EDP Bero made aware of pt's critical Lactic acid: 6.8 & critical CBG of 608.

## 2019-02-27 DIAGNOSIS — Z20822 Contact with and (suspected) exposure to covid-19: Secondary | ICD-10-CM | POA: Diagnosis present

## 2019-02-27 DIAGNOSIS — E860 Dehydration: Secondary | ICD-10-CM

## 2019-02-27 DIAGNOSIS — Z833 Family history of diabetes mellitus: Secondary | ICD-10-CM | POA: Diagnosis not present

## 2019-02-27 DIAGNOSIS — F4322 Adjustment disorder with anxiety: Secondary | ICD-10-CM | POA: Diagnosis present

## 2019-02-27 DIAGNOSIS — F445 Conversion disorder with seizures or convulsions: Secondary | ICD-10-CM

## 2019-02-27 DIAGNOSIS — G8929 Other chronic pain: Secondary | ICD-10-CM | POA: Diagnosis present

## 2019-02-27 DIAGNOSIS — F332 Major depressive disorder, recurrent severe without psychotic features: Secondary | ICD-10-CM | POA: Diagnosis present

## 2019-02-27 DIAGNOSIS — K5900 Constipation, unspecified: Secondary | ICD-10-CM | POA: Diagnosis present

## 2019-02-27 DIAGNOSIS — R109 Unspecified abdominal pain: Secondary | ICD-10-CM

## 2019-02-27 DIAGNOSIS — F4329 Adjustment disorder with other symptoms: Secondary | ICD-10-CM

## 2019-02-27 DIAGNOSIS — Z79899 Other long term (current) drug therapy: Secondary | ICD-10-CM | POA: Diagnosis not present

## 2019-02-27 DIAGNOSIS — K920 Hematemesis: Secondary | ICD-10-CM | POA: Diagnosis present

## 2019-02-27 DIAGNOSIS — G40909 Epilepsy, unspecified, not intractable, without status epilepticus: Secondary | ICD-10-CM | POA: Diagnosis present

## 2019-02-27 DIAGNOSIS — E1010 Type 1 diabetes mellitus with ketoacidosis without coma: Secondary | ICD-10-CM | POA: Diagnosis not present

## 2019-02-27 DIAGNOSIS — E1165 Type 2 diabetes mellitus with hyperglycemia: Secondary | ICD-10-CM

## 2019-02-27 DIAGNOSIS — Z9119 Patient's noncompliance with other medical treatment and regimen: Secondary | ICD-10-CM | POA: Diagnosis not present

## 2019-02-27 DIAGNOSIS — Z794 Long term (current) use of insulin: Secondary | ICD-10-CM | POA: Diagnosis not present

## 2019-02-27 DIAGNOSIS — L03314 Cellulitis of groin: Secondary | ICD-10-CM | POA: Diagnosis present

## 2019-02-27 LAB — URINALYSIS, ROUTINE W REFLEX MICROSCOPIC
Bacteria, UA: NONE SEEN
Bilirubin Urine: NEGATIVE
Glucose, UA: >=500 mg/dL — AB
Ketones, ur: NEGATIVE mg/dL
Leukocytes,Ua: NEGATIVE
Nitrite: NEGATIVE
Protein, ur: NEGATIVE mg/dL
Specific Gravity, Urine: >1.046 — ABNORMAL HIGH (ref 1.005–1.030)
pH: 6 (ref 5.0–8.0)

## 2019-02-27 LAB — BASIC METABOLIC PANEL
Anion gap: 7 (ref 5–15)
BUN: 12 mg/dL (ref 6–20)
CO2: 23 mmol/L (ref 22–32)
Calcium: 8.5 mg/dL — ABNORMAL LOW (ref 8.9–10.3)
Chloride: 106 mmol/L (ref 98–111)
Creatinine, Ser: 0.49 mg/dL (ref 0.44–1.00)
GFR calc Af Amer: >60 mL/min (ref >60)
GFR calc non Af Amer: >60 mL/min (ref >60)
Glucose, Bld: 213 mg/dL — ABNORMAL HIGH (ref 70–99)
Potassium: 4.1 mmol/L (ref 3.5–5.1)
Sodium: 136 mmol/L (ref 135–145)

## 2019-02-27 LAB — GLUCOSE, CAPILLARY
Glucose-Capillary: 152 mg/dL — ABNORMAL HIGH (ref 70–99)
Glucose-Capillary: 207 mg/dL — ABNORMAL HIGH (ref 70–99)
Glucose-Capillary: 229 mg/dL — ABNORMAL HIGH (ref 70–99)
Glucose-Capillary: 89 mg/dL (ref 70–99)
Glucose-Capillary: 115 mg/dL — ABNORMAL HIGH (ref 70–99)
Glucose-Capillary: 362 mg/dL — ABNORMAL HIGH (ref 70–99)
Glucose-Capillary: 332 mg/dL — ABNORMAL HIGH (ref 70–99)
Glucose-Capillary: 274 mg/dL — ABNORMAL HIGH (ref 70–99)

## 2019-02-27 LAB — CBC
HCT: 38.9 % (ref 36.0–46.0)
Hemoglobin: 12.4 g/dL (ref 12.0–15.0)
MCH: 25.9 pg — ABNORMAL LOW (ref 26.0–34.0)
MCHC: 31.9 g/dL (ref 30.0–36.0)
MCV: 81.4 fL (ref 80.0–100.0)
Platelets: 350 10*3/uL (ref 150–400)
RBC: 4.78 MIL/uL (ref 3.87–5.11)
RDW: 13.1 % (ref 11.5–15.5)
WBC: 9.9 10*3/uL (ref 4.0–10.5)
nRBC: 0 % (ref 0.0–0.2)

## 2019-02-27 LAB — BETA-HYDROXYBUTYRIC ACID: Beta-Hydroxybutyric Acid: 0.14 mmol/L (ref 0.05–0.27)

## 2019-02-27 LAB — SARS CORONAVIRUS 2 (TAT 6-24 HRS): SARS Coronavirus 2: NEGATIVE

## 2019-02-27 LAB — HEMOGLOBIN A1C
Hgb A1c MFr Bld: 13.4 % — ABNORMAL HIGH (ref 4.8–5.6)
Mean Plasma Glucose: 337.88 mg/dL

## 2019-02-27 LAB — CBG MONITORING, ED: Glucose-Capillary: 148 mg/dL — ABNORMAL HIGH (ref 70–99)

## 2019-02-27 MED ORDER — INSULIN ASPART 100 UNIT/ML ~~LOC~~ SOLN
6.0000 [IU] | Freq: Three times a day (TID) | SUBCUTANEOUS | Status: DC
  Administered 2019-02-27 – 2019-02-28 (×3): 6 [IU] via SUBCUTANEOUS

## 2019-02-27 MED ORDER — HEPARIN SODIUM (PORCINE) 5000 UNIT/ML IJ SOLN
5000.0000 [IU] | Freq: Three times a day (TID) | INTRAMUSCULAR | Status: DC
  Administered 2019-02-27 – 2019-02-28 (×3): 5000 [IU] via SUBCUTANEOUS
  Filled 2019-02-27 (×4): qty 1

## 2019-02-27 MED ORDER — CYCLOSPORINE 0.05 % OP EMUL
1.0000 [drp] | Freq: Two times a day (BID) | OPHTHALMIC | Status: DC
  Administered 2019-02-27 – 2019-02-28 (×4): 1 [drp] via OPHTHALMIC
  Filled 2019-02-27 (×5): qty 1

## 2019-02-27 MED ORDER — INSULIN REGULAR(HUMAN) IN NACL 100-0.9 UT/100ML-% IV SOLN: INTRAVENOUS | Status: DC

## 2019-02-27 MED ORDER — POLYETHYLENE GLYCOL 3350 17 G PO PACK
17.0000 g | PACK | Freq: Every day | ORAL | Status: DC
  Administered 2019-02-27 – 2019-02-28 (×2): 17 g via ORAL
  Filled 2019-02-27 (×2): qty 1

## 2019-02-27 MED ORDER — INSULIN ASPART 100 UNIT/ML ~~LOC~~ SOLN
0.0000 [IU] | Freq: Three times a day (TID) | SUBCUTANEOUS | Status: DC
  Administered 2019-02-27: 14:00:00 20 [IU] via SUBCUTANEOUS
  Administered 2019-02-27 – 2019-02-28 (×3): 15 [IU] via SUBCUTANEOUS

## 2019-02-27 MED ORDER — BISACODYL 10 MG RE SUPP
10.0000 mg | Freq: Every day | RECTAL | Status: DC
  Administered 2019-02-27 – 2019-02-28 (×2): 10 mg via RECTAL
  Filled 2019-02-27 (×2): qty 1

## 2019-02-27 MED ORDER — MINERAL OIL RE ENEM
1.0000 | ENEMA | Freq: Once | RECTAL | Status: AC
  Administered 2019-02-27: 14:00:00 1 via RECTAL
  Filled 2019-02-27: qty 1

## 2019-02-27 MED ORDER — INSULIN GLARGINE 100 UNIT/ML ~~LOC~~ SOLN
30.0000 [IU] | Freq: Every day | SUBCUTANEOUS | Status: DC
  Administered 2019-02-27 – 2019-02-28 (×2): 30 [IU] via SUBCUTANEOUS
  Filled 2019-02-27 (×2): qty 0.3

## 2019-02-27 MED ORDER — OLOPATADINE HCL 0.1 % OP SOLN
2.0000 [drp] | Freq: Two times a day (BID) | OPHTHALMIC | Status: DC
  Administered 2019-02-27 – 2019-02-28 (×4): 2 [drp] via OPHTHALMIC
  Filled 2019-02-27: qty 5

## 2019-02-27 MED ORDER — ATORVASTATIN CALCIUM 10 MG PO TABS
20.0000 mg | ORAL_TABLET | Freq: Every day | ORAL | Status: DC
  Administered 2019-02-27: 18:00:00 20 mg via ORAL
  Filled 2019-02-27 (×2): qty 2

## 2019-02-27 MED ORDER — KETOROLAC TROMETHAMINE 15 MG/ML IJ SOLN
15.0000 mg | Freq: Once | INTRAMUSCULAR | Status: AC
  Administered 2019-02-27: 15 mg via INTRAVENOUS
  Filled 2019-02-27: qty 1

## 2019-02-27 NOTE — Progress Notes (Signed)
Patient arrived the unit form the ED on a stretcher, assessment completed see flow sheet, placed on tele ccmd notified, patient oriented to room and staff, bed in lowest position, call bell within reach will continue to monitor.

## 2019-02-27 NOTE — Progress Notes (Signed)
PROGRESS NOTE    Nancy [name]  MAU:[id number] DOB: [birth year] DOA: 02/26/2019 PCP: Fleet Contras, MD    Brief Narrative: (Start on day 1 of progress note - keep it brief and live) Nancy [name] is a 19 y.o. female with medical history significant for poorly controlled insulin-dependent diabetes mellitus, chronic abdominal and chest pain, seizures and/or pseudoseizures, depression with anxiety, and poor adherence with her treatment plan, now presenting to the emergency department with nausea, vomiting, abdominal pain, and multiple seizures at home.  Patient reports that she has had increase in her chronic abdominal pain, nausea, and vomiting for the past 1.5 weeks.  She then sought a small amount of red blood in her vomit today, reports having several seizures at home without any significant injury, and comes in for evaluation of that.  She also reports a recurrent boil near the left groin that she lanced a couple days ago but continues to have some mild erythema and tenderness there.  She denies any fevers or chills, denies cough, denies shortness of breath, sore throat, or loss of taste or smell.  She was found to have severe hyperglycemia with associated tachycardia and low normal BP and elevated lactate. She received IVF with improvement in her lactate, HR and blood pressure.    Assessment & Plan:   Principal Problem:   DKA (diabetic ketoacidoses) (HCC) Active Problems:   Adjustment disorder   Chronic abdominal pain   Conversion disorder with attacks or seizures, acute episode, with psychological stressor   Severe recurrent major depression without psychotic features (HCC)  1. Uncontrolled IDDM with hyperglycemia  - Presents with numerous complaints and found to have serum glucose 608 with bicarb 19 and AG 17. Glucose down to 213 with improvement in bicarb as well - A1c was 11.3% in December 2020, up to 13.4 today. Concerned for noncompliance - She was fluid-resuscitated in ED  and started on insulin infusion  - Continue lantus 30 U daily and humalog with meals per SSI. Will add mealtime humalog at 6 U with each meal.   2. Abdominal pain  - Chronic, no acute findings no CT in ED     3. Seizures   - Reports multiples episodes prior to arrival, hx of seizures vs pseudoseizures, no resulting injury today  - Continue antiepileptics    4. Depression, anxiety - Continue Seroquel, Buspar, Remeron, Atarax, propranolol    5. Hematemesis  - Patient reports frequent N/V over the past week or more with possible blood in vomitus tonight  - Type & screen was performed in ED, initial H&H normal  - Continue PPI, repeat CBC in am (sooner if active bleeding)  - She has GI follow-up planned   6. Cellulitis  - Patient reports recurrent boil near left groin that she lanced at home a couple days ago but continues to have some mild erythema and tenderness there  - No residual fluid-collection note on CT  - Treat with Bactrim    7. Constipation/abdominal pain - Reports constipation with ~ 1 BM monthly as well as persistent abdominal pain - Will give 1 time dose of toradol 15 mg for pain - Reports she has tried miralax and stool softeners as well as enemas. Will give mineral oil enema today .   DVT prophylaxis:Heparin Code Status: Full Family Communication: None Disposition Plan: Anticipate discharge in 24-48 hours pending clinical improvement   Consultants:  None   Procedures:   None   Antimicrobials:  Bactrim 2/20   Subjective:  Patient reports abdominal pain this morning that is unchanged from arrival. She denies any associated nausea or vomiting. She states her abdominal pain is chronic however has worsened over the last couple weeks. She denies any chest pain, SOB, fever, chills. She does admit to associated constipation.  Objective: Vitals:   02/27/19 0418 02/27/19 0843 02/27/19 0930 02/27/19 1100  BP: 102/62 114/78    Pulse: 99 100 99   Resp: 20  18    Temp: 97.7 F (36.5 C) (!) 97.5 F (36.4 C)  97.6 F (36.4 C)  TempSrc: Oral Oral  Oral  SpO2: 99% 100%    Weight:      Height:        Intake/Output Summary (Last 24 hours) at 02/27/2019 1241 Last data filed at 02/27/2019 0800 Gross per 24 hour  Intake 1078.88 ml  Output 150 ml  Net 928.88 ml   Filed Weights   02/27/19 0056  Weight: 90.9 kg    Examination:  General exam: Appears calm although uncomfortable secondary to abdominal pain Respiratory system: Clear to auscultation. Respiratory effort normal. Cardiovascular system: S1 & S2 heard, RRR. No JVD, murmurs, rubs, gallops or clicks. No pedal edema. Gastrointestinal system: Abdomen is nondistended, soft and nontender. No organomegaly or masses felt. Normal bowel sounds heard. Central nervous system: Alert and oriented. No focal neurological deficits. Extremities: Symmetric 5 x 5 power. Skin: No rashes, lesions or ulcers Psychiatry: Judgement and insight appear normal. Mood & affect appropriate.   Data Reviewed: I have personally reviewed following labs and imaging studies  CBC: Recent Labs  Lab 02/26/19 1918 02/26/19 1928 02/26/19 2101 02/27/19 0210  WBC  --  8.2  --  9.9  HGB 15.3* 13.7 14.6 12.4  HCT 45.0 43.8 43.0 38.9  MCV  --  81.9  --  81.4  PLT  --  366  --  885   Basic Metabolic Panel: Recent Labs  Lab 02/26/19 1918 02/26/19 1928 02/26/19 2101 02/27/19 0210  NA 132* 133* 135 136  K 4.0 4.1 3.7 4.1  CL  --  97*  --  106  CO2  --  19*  --  23  GLUCOSE  --  608*  --  213*  BUN  --  7  --  12  CREATININE  --  0.87  --  0.49  CALCIUM  --  9.0  --  8.5*   GFR: Estimated Creatinine Clearance: 120.1 mL/min (by C-G formula based on SCr of 0.49 mg/dL). Liver Function Tests: Recent Labs  Lab 02/26/19 1928  AST 25  ALT 25  ALKPHOS 115  BILITOT 0.6  PROT 6.8  ALBUMIN 3.5   Recent Labs  Lab 02/26/19 1928  LIPASE 20   No results for input(s): AMMONIA in the last 168 hours. Coagulation  Profile: No results for input(s): INR, PROTIME in the last 168 hours. Cardiac Enzymes: No results for input(s): CKTOTAL, CKMB, CKMBINDEX, TROPONINI in the last 168 hours. BNP (last 3 results) No results for input(s): PROBNP in the last 8760 hours. HbA1C: Recent Labs    02/27/19 0210  HGBA1C 13.4*   CBG: Recent Labs  Lab 02/27/19 0203 02/27/19 0304 02/27/19 0415 02/27/19 0518 02/27/19 1103  GLUCAP 207* 229* 89 115* 362*   Lipid Profile: No results for input(s): CHOL, HDL, LDLCALC, TRIG, CHOLHDL, LDLDIRECT in the last 72 hours. Thyroid Function Tests: No results for input(s): TSH, T4TOTAL, FREET4, T3FREE, THYROIDAB in the last 72 hours. Anemia Panel: No results for input(s): VITAMINB12, FOLATE,  FERRITIN, TIBC, IRON, RETICCTPCT in the last 72 hours. Urine analysis:    Component Value Date/Time   COLORURINE YELLOW 02/27/2019 0944   APPEARANCEUR CLOUDY (A) 02/27/2019 0944   LABSPEC >1.046 (H) 02/27/2019 0944   PHURINE 6.0 02/27/2019 0944   GLUCOSEU >=500 (A) 02/27/2019 0944   HGBUR SMALL (A) 02/27/2019 0944   BILIRUBINUR NEGATIVE 02/27/2019 0944   BILIRUBINUR negative 07/13/2018 1651   KETONESUR NEGATIVE 02/27/2019 0944   PROTEINUR NEGATIVE 02/27/2019 0944   UROBILINOGEN 0.2 07/13/2018 1651   UROBILINOGEN 1.0 07/09/2017 1324   NITRITE NEGATIVE 02/27/2019 0944   LEUKOCYTESUR NEGATIVE 02/27/2019 0944   Sepsis Labs: @LABRCNTIP (procalcitonin:4,lacticidven:4)  ) Recent Results (from the past 240 hour(s))  Culture, blood (single) w Reflex to ID Panel     Status: None (Preliminary result)   Collection Time: 02/26/19  6:57 PM   Specimen: BLOOD  Result Value Ref Range Status   Specimen Description BLOOD RIGHT ANTECUBITAL  Final   Special Requests   Final    BOTTLES DRAWN AEROBIC ONLY Blood Culture results may not be optimal due to an inadequate volume of blood received in culture bottles Performed at [facility], [address] [place], [place]     Culture PENDING  Incomplete   Report Status PENDING  Incomplete  SARS CORONAVIRUS 2 (TAT 6-24 HRS) Nasopharyngeal Nasopharyngeal Swab     Status: None   Collection Time: 02/26/19  9:20 PM   Specimen: Nasopharyngeal Swab  Result Value Ref Range Status   SARS Coronavirus 2 NEGATIVE NEGATIVE Final    Comment: (NOTE) SARS-CoV-2 target nucleic acids are NOT DETECTED. The SARS-CoV-2 RNA is generally detectable in upper and lower respiratory specimens during the acute phase of infection. Negative results do not preclude SARS-CoV-2 infection, do not rule out co-infections with other pathogens, and should not be used as the sole basis for treatment or other patient management decisions. Negative results must be combined with clinical observations, patient history, and epidemiological information. The expected result is Negative. Fact Sheet for Patients: 02/28/19 Fact Sheet for Healthcare Providers: HairSlick.no This test is not yet approved or cleared by the quierodirigir.com FDA and  has been authorized for detection and/or diagnosis of SARS-CoV-2 by FDA under an Emergency Use Authorization (EUA). This EUA will remain  in effect (meaning this test can be used) for the duration of the COVID-19 declaration under Section 56 4(b)(1) of the Act, 21 U.S.C. section 360bbb-3(b)(1), unless the authorization is terminated or revoked sooner. Performed at [facility], [address], [place], [place]          Radiology Studies: CT ABDOMEN PELVIS W CONTRAST  Result Date: 02/26/2019 CLINICAL DATA:  Abdominal pain and hematemesis. EXAM: CT ABDOMEN AND PELVIS WITH CONTRAST TECHNIQUE: Multidetector CT imaging of the abdomen and pelvis was performed using the standard protocol following bolus administration of intravenous contrast. CONTRAST:  02/28/2019 OMNIPAQUE IOHEXOL 300 MG/ML  SOLN COMPARISON:  CT dated December 19, 2018.  FINDINGS: Lower chest: The lung bases are clear. The heart size is normal. Hepatobiliary: The liver is normal. Normal gallbladder.There is no biliary ductal dilation. Pancreas: Normal contours without ductal dilatation. No peripancreatic fluid collection. Spleen: No splenic laceration or hematoma. Adrenals/Urinary Tract: --Adrenal glands: No adrenal hemorrhage. --Right kidney/ureter: No hydronephrosis or perinephric hematoma. --Left kidney/ureter: No hydronephrosis or perinephric hematoma. --Urinary bladder: Unremarkable. Stomach/Bowel: --Stomach/Duodenum: No hiatal hernia or other gastric abnormality. Normal duodenal course and caliber. --Small bowel: No dilatation or inflammation. --Colon: No focal  abnormality. --Appendix: Normal. Vascular/Lymphatic: Normal course and caliber of the major abdominal vessels. --No retroperitoneal lymphadenopathy. --No mesenteric lymphadenopathy. --No pelvic or inguinal lymphadenopathy. Reproductive: Superior to the left labia majora there is an area of skin thickening and subcutaneous fat induration (axial series 3, image 91). Other: No ascites or free air. There is a new nodule within the subcutaneous fat of the anterior abdominal wall on the right measuring approximately 1.5 cm (axial series 3, image 65). This is of doubtful clinical significance and may represent an injection granuloma or small subcutaneous hematoma. Musculoskeletal. No acute displaced fractures. IMPRESSION: 1. No CT evidence of acute intra-abdominal or intrapelvic abnormalities. 2. There is an area of skin thickening and subcutaneous fat stranding superior to the left labia majora. Findings favored to represent a small soft tissue infection. Clinical correlation is recommended. Electronically Signed   By: Katherine Mantle M.D.   On: 02/26/2019 22:17   DG Chest Port 1 View  Result Date: 02/26/2019 CLINICAL DATA:  Chest pain. Seizure. EXAM: PORTABLE CHEST 1 VIEW COMPARISON:  01/27/2019 FINDINGS: The  cardiomediastinal contours are normal. The lungs are clear. Pulmonary vasculature is normal. No consolidation, pleural effusion, or pneumothorax. No acute osseous abnormalities are seen. IMPRESSION: Negative chest radiograph. Electronically Signed   By: Narda Rutherford M.D.   On: 02/26/2019 19:21        Scheduled Meds: . atorvastatin  20 mg Oral q1800  . bisacodyl  10 mg Rectal Daily  . busPIRone  10 mg Oral TID  . cycloSPORINE  1 drop Both Eyes BID  . insulin aspart  0-20 Units Subcutaneous TID WC  . insulin glargine  30 Units Subcutaneous Daily  . lamoTRIgine  50 mg Oral BID  . lubiprostone  24 mcg Oral BID WC  . mineral oil  1 enema Rectal Once  . mirtazapine  7.5 mg Oral QHS  . olopatadine  2 drop Both Eyes BID  . pantoprazole  40 mg Oral BID AC  . polyethylene glycol  17 g Oral Daily  . propranolol  20 mg Oral BID  . QUEtiapine  400 mg Oral QHS  . sulfamethoxazole-trimethoprim  1 tablet Oral Q12H  . topiramate  25 mg Oral BID   Continuous Infusions:   LOS: 0 days   Pollyann Savoy, MD [facility]  02/27/2019, 12:41 PM

## 2019-02-27 NOTE — Progress Notes (Signed)
Inpatient Diabetes Program Recommendations  AACE/ADA: New Consensus Statement on Inpatient Glycemic Control   Target Ranges:  Prepandial:   less than 140 mg/dL      Peak postprandial:   less than 180 mg/dL (1-2 hours)      Critically ill patients:  140 - 180 mg/dL   Results for TENAE, GRAZIOSI (MRN [id number]) as of 02/27/2019 10:15  Ref. Range 02/27/2019 00:04 02/27/2019 01:01 02/27/2019 02:03 02/27/2019 03:04 02/27/2019 3:48 02/27/2019 04:15 02/27/2019 05:18  Glucose-Capillary Latest Ref Range: 70 - 99 mg/dL 701 (H) 779 (H) 390 (H) 229 (H)    Lantus 30 units 89 115 (H)  Results for BUSHRA, DENMAN (MRN [id number]) as of 02/27/2019 10:15  Ref. Range 02/26/2019 19:28 02/27/2019 02:10  Beta-Hydroxybutyric Acid Latest Ref Range: 0.05 - 0.27 mmol/L  0.14  Glucose Latest Ref Range: 70 - 99 mg/dL 762 (HH) 263 (H)  Hemoglobin A1C Latest Ref Range: 4.8 - 5.6 %  13.4 (H)   Review of Glycemic Control  Diabetes history: DM2 Outpatient Diabetes medications: Lantus 60 units QHS, Humalog 10 units QAM and QHS Current orders for Inpatient glycemic control: Lantus 30 units daily, Novolog 0-20 units TID with meals  Inpatient Diabetes Program Recommendations:   Insulin-Basal: If glucose becomes consistently elevated despite Novolog correction, may need to increase Lantus to 30 units BID (to start at bedtime today if persistent hyperglycemia).  Insulin-Correction: Please consider ordering Novolog 0-5 units QHS.  A1C: A1C 13.4% on 02/27/19 indicating an average glucose of 338 mg/dl over the past 2-3 months.  NOTE: Noted consult for Diabetes Coordinator. Diabetes Coordinator is not on campus over the weekend but available by pager from 8am to 5pm for questions or concerns. Chart reviewed. Patient has had several hospitalizations over the past 6 months and most recently inpatient 01/27/19 to 01/29/19. During that admission patient was last ordered Lantus 60 units daily, Novolog 5 units TID with meals for meal  coverage and Novolog 0-9 units correction Q4H. Diabetes Coordinator will follow up with patient on 02/28/19.  Thanks, Orlando Penner, RN, MSN, CDE Diabetes Coordinator Inpatient Diabetes Program [phone] (Team Pager from 8am to 5pm)

## 2019-02-28 LAB — CBC
HCT: 38.2 % (ref 36.0–46.0)
HCT: 36.6 % (ref 36.0–46.0)
Hemoglobin: 11.9 g/dL — ABNORMAL LOW (ref 12.0–15.0)
Hemoglobin: 11.5 g/dL — ABNORMAL LOW (ref 12.0–15.0)
MCH: 26 pg (ref 26.0–34.0)
MCH: 25.8 pg — ABNORMAL LOW (ref 26.0–34.0)
MCHC: 31.2 g/dL (ref 30.0–36.0)
MCHC: 31.4 g/dL (ref 30.0–36.0)
MCV: 83.6 fL (ref 80.0–100.0)
MCV: 82.1 fL (ref 80.0–100.0)
Platelets: 304 10*3/uL (ref 150–400)
Platelets: 308 10*3/uL (ref 150–400)
RBC: 4.57 MIL/uL (ref 3.87–5.11)
RBC: 4.46 MIL/uL (ref 3.87–5.11)
RDW: 13.4 % (ref 11.5–15.5)
RDW: 13.2 % (ref 11.5–15.5)
WBC: 7.6 10*3/uL (ref 4.0–10.5)
WBC: 6.5 10*3/uL (ref 4.0–10.5)
nRBC: 0 % (ref 0.0–0.2)
nRBC: 0 % (ref 0.0–0.2)

## 2019-02-28 LAB — GLUCOSE, CAPILLARY
Glucose-Capillary: 310 mg/dL — ABNORMAL HIGH (ref 70–99)
Glucose-Capillary: 304 mg/dL — ABNORMAL HIGH (ref 70–99)

## 2019-02-28 LAB — BASIC METABOLIC PANEL
Anion gap: 10 (ref 5–15)
BUN: 18 mg/dL (ref 6–20)
CO2: 24 mmol/L (ref 22–32)
Calcium: 9.1 mg/dL (ref 8.9–10.3)
Chloride: 99 mmol/L (ref 98–111)
Creatinine, Ser: 0.95 mg/dL (ref 0.44–1.00)
GFR calc Af Amer: >60 mL/min (ref >60)
GFR calc non Af Amer: >60 mL/min (ref >60)
Glucose, Bld: 363 mg/dL — ABNORMAL HIGH (ref 70–99)
Potassium: 4.6 mmol/L (ref 3.5–5.1)
Sodium: 133 mmol/L — ABNORMAL LOW (ref 135–145)

## 2019-02-28 MED ORDER — SULFAMETHOXAZOLE-TRIMETHOPRIM 800-160 MG PO TABS: 1.0000 | ORAL_TABLET | Freq: Two times a day (BID) | ORAL | 0 refills | Status: AC

## 2019-02-28 NOTE — Progress Notes (Signed)
Inpatient Diabetes Program Recommendations  AACE/ADA: New Consensus Statement on Inpatient Glycemic Control (2015)  Target Ranges:  Prepandial:   less than 140 mg/dL      Peak postprandial:   less than 180 mg/dL (1-2 hours)      Critically ill patients:  140 - 180 mg/dL   Lab Results  Component Value Date   GLUCAP 310 (H) 02/28/2019   HGBA1C 13.4 (H) 02/27/2019    Review of Glycemic Control Results for AMARAH, BROSSMAN (MRN [id number]) as of 02/28/2019 10:25  Ref. Range 02/27/2019 11:03 02/27/2019 15:57 02/27/2019 21:20 02/28/2019 06:23  Glucose-Capillary Latest Ref Range: 70 - 99 mg/dL 944 (H) 967 (H) 591 (H) 310 (H)    Diabetes history: DM1 (does not make insulin) Outpatient Diabetes medications: Lantus 70 units daily+ Humalog 10 units AC& HS Current orders for Inpatient glycemic control: Lantus 30 units daily + Novolog 0-20 TID + Novolog 6 units TID with meals.    Note:  Spoke with patient at bedside today.  She states she has Type 1 DM and was diagnosed at age 19.  Reviewed patient's current A1c of 13.4%. Explained what a A1c is and what it measures. Also reviewed goal A1c with patient, importance of good glucose control @ home, and blood sugar goals.  She seemed very surprised with this result.  She states her A1C is usually around 10%.  She states she drinks a lot of Kool Aid and does not watch what she eats.  Reviewed importance of glucose control and long term complications of uncontrolled diabetes.  She sees Dr. Donnie Coffin at [facility].  She had a telephone appointment last week and has a follow up appointment next month.  She denies difficulties obtaining medications.  She states she has been taking her insulin as prescribed.  Encouraged her to keep follow up appointment with PCP and bring meter with her to that appointment.  She states she checks her blood sugar 2-3 times a day.  Does not check first thing in the morning before eating.  Encouraged her to check her fasting blood  sugars for PCP to review.  Encouraged her to call her PCP if blood sugars consistently >200 or <100 mg/dl.  Reviewed hypoglycemia and treatment.  No further questions at this time.    Thank you, Dulce Sellar, RN, BSN Diabetes Coordinator Inpatient Diabetes Program [phone] (team pager from 8a-5p)

## 2019-02-28 NOTE — Discharge Instructions (Signed)
You were cared for by a hospitalist during your hospital stay. If you have any questions about your discharge medications or the care you received while you were in the hospital after you are discharged, you can call the unit and ask to speak with the hospitalist on call if the hospitalist that took care of you is not available. Once you are discharged, your primary care physician will handle any further medical issues. Please note that NO REFILLS for any discharge medications will be authorized once you are discharged, as it is imperative that you return to your primary care physician (or establish a relationship with a primary care physician if you do not have one) for your aftercare needs so that they can reassess your need for medications and monitor your lab values.  

## 2019-02-28 NOTE — Discharge Summary (Signed)
Physician Discharge Summary  Nancy [name] UYQ:[id number] DOB: [birth year] DOA: 02/26/2019  PCP: Nancy Contras, MD  Admit date: 02/26/2019 Discharge date: 02/28/2019  Admitted From: Home Disposition:  Home  Recommendations for Outpatient Follow-up:  1. Follow up with PCP in 1 week 2. Follow up with your gastroenterologist, Dr [name] Nancy in 3-4 weeks 3. Please obtain BMP/CBC in 1 week   Discharge Condition: Good CODE STATUS: Full  Diet recommendation: CHO modified  Brief/Interim Summary: Patient is a 19 yo F with a past medical history of poorly controlled insulin-dependent diabetes, seizures versus pseudoseizures, depression with anxiety, and noncompliance presenting for seizures and blood in vomit.  She was found to be in DKA.  She underwent treatment.  She steadily improved and was transitioned to basal bolus insulin.  She reports compliance at home.  Subjective on day of discharge: Largely feeling well. Pt reports she threw up frank blood over night. This was not reported by overnight nursing team, though pt did state they saw it. Hb reck is stable for d/c for outpatient follow up.   Discharge Diagnoses:  Principal Problem:   DKA (diabetic ketoacidoses) (HCC) Active Problems:   Adjustment disorder   Chronic abdominal pain   Conversion disorder with attacks or seizures, acute episode, with psychological stressor   Severe recurrent major depression without psychotic features (HCC)   Hyperglycemia due to diabetes mellitus [facility])   Discharge Instructions  Discharge Instructions    Call MD for:  persistant dizziness or light-headedness   Complete by: As directed    Call MD for:  persistant nausea and vomiting   Complete by: As directed    Call MD for:  severe uncontrolled pain   Complete by: As directed    Diet - low sodium heart healthy   Complete by: As directed    Increase activity slowly   Complete by: As directed      Allergies as of 02/28/2019      Reactions    Ibuprofen Other (See Comments)   GI MD said to not take this anymore   Oatmeal Rash      Medication List    STOP taking these medications   lactulose 10 GM/15ML solution Commonly known as: CHRONULAC     TAKE these medications   atorvastatin 20 MG tablet Commonly known as: LIPITOR Take 1 tablet (20 mg total) by mouth daily at 6 PM.   bisacodyl 10 MG suppository Commonly known as: DULCOLAX Place 1 suppository (10 mg total) rectally daily. Notes to patient: Take tomorrow AM 03/01/2019   busPIRone 10 MG tablet Commonly known as: BUSPAR Take 10 mg by mouth 3 (three) times daily.   cycloSPORINE 0.05 % ophthalmic emulsion Commonly known as: RESTASIS Place 1 drop into both eyes 2 (two) times daily.   hydrOXYzine 50 MG tablet Commonly known as: ATARAX/VISTARIL Take 1 tablet (50 mg total) by mouth 3 (three) times daily as needed for anxiety. What changed: when to take this Notes to patient: Take as needed for anxiety 02/28/2019   insulin glargine 100 UNIT/ML injection Commonly known as: LANTUS Inject 0.6 mLs (60 Units total) into the skin at bedtime. For diabetes management What changed: how much to take   insulin lispro 100 UNIT/ML injection Commonly known as: HUMALOG Inject 0.1 mLs (10 Units total) into the skin 3 (three) times daily with meals. What changed: when to take this   lamoTRIgine 25 MG tablet Commonly known as: LAMICTAL Take 2 tablets (50 mg total) by mouth 2 (two) times  daily. For mood stabilization   lubiprostone 24 MCG capsule Commonly known as: AMITIZA Take 1 capsule (24 mcg total) by mouth 2 (two) times daily with a meal.   metoCLOPramide 10 MG tablet Commonly known as: REGLAN Take 1 tablet (10 mg total) by mouth 4 (four) times daily -  before meals and at bedtime. What changed:   when to take this  reasons to take this   mirtazapine 7.5 MG tablet Commonly known as: REMERON Take 1 tablet (7.5 mg total) by mouth at bedtime. For  depression/sleep   multivitamin with minerals Tabs tablet Take 1 tablet by mouth daily. Notes to patient: Take tomorrow AM 03/01/2019   pantoprazole 40 MG tablet Commonly known as: PROTONIX Take 1 tablet (40 mg total) by mouth 2 (two) times daily. For acid reflux   Pazeo 0.7 % Soln Generic drug: Olopatadine HCl Place 2 drops into both eyes 2 (two) times daily.   polyethylene glycol 17 g packet Commonly known as: MIRALAX / GLYCOLAX Take 17 g by mouth daily. Notes to patient: Take tomorrow AM 03/01/2019   prochlorperazine 25 MG suppository Commonly known as: COMPAZINE Place 1 suppository (25 mg total) rectally every 12 (twelve) hours as needed for nausea or vomiting. Notes to patient: Take as needed for Nausea and Vomiting 02/28/2019   promethazine 25 MG suppository Commonly known as: PHENERGAN Place 25 mg rectally every 8 (eight) hours as needed for nausea or vomiting. Notes to patient: Take as needed for Nausea and Vomiting 02/28/2019   propranolol 20 MG tablet Commonly known as: INDERAL Take 1 tablet (20 mg total) by mouth 2 (two) times daily. For anxiety/HTN   QUEtiapine 200 MG tablet Commonly known as: SEROQUEL Take 2 tablets (400 mg total) by mouth at bedtime.   sulfamethoxazole-trimethoprim 800-160 MG tablet Commonly known as: BACTRIM DS Take 1 tablet by mouth every 12 (twelve) hours for 5 days.   topiramate 25 MG tablet Commonly known as: TOPAMAX Take 1 tablet (25 mg total) by mouth 2 (two) times daily. For seizure activities       Allergies  Allergen Reactions  . Ibuprofen Other (See Comments)    GI MD said to not take this anymore  . Oatmeal Rash    Procedures/Studies: CT ABDOMEN PELVIS W CONTRAST  Result Date: 02/26/2019 CLINICAL DATA:  Abdominal pain and hematemesis. EXAM: CT ABDOMEN AND PELVIS WITH CONTRAST TECHNIQUE: Multidetector CT imaging of the abdomen and pelvis was performed using the standard protocol following bolus administration of  intravenous contrast. CONTRAST:  OMNIPAQUE IOHEXOL 300 MG/ML  SOLN COMPARISON:  CT dated December 19, 2018. FINDINGS: Lower chest: The lung bases are clear. The heart size is normal. Hepatobiliary: The liver is normal. Normal gallbladder.There is no biliary ductal dilation. Pancreas: Normal contours without ductal dilatation. No peripancreatic fluid collection. Spleen: No splenic laceration or hematoma. Adrenals/Urinary Tract: --Adrenal glands: No adrenal hemorrhage. --Right kidney/ureter: No hydronephrosis or perinephric hematoma. --Left kidney/ureter: No hydronephrosis or perinephric hematoma. --Urinary bladder: Unremarkable. Stomach/Bowel: --Stomach/Duodenum: No hiatal hernia or other gastric abnormality. Normal duodenal course and caliber. --Small bowel: No dilatation or inflammation. --Colon: No focal abnormality. --Appendix: Normal. Vascular/Lymphatic: Normal course and caliber of the major abdominal vessels. --No retroperitoneal lymphadenopathy. --No mesenteric lymphadenopathy. --No pelvic or inguinal lymphadenopathy. Reproductive: Superior to the left labia majora there is an area of skin thickening and subcutaneous fat induration (axial series 3, image 91). Other: No ascites or free air. There is a new nodule within the subcutaneous fat of the anterior abdominal  wall on the right measuring approximately 1.5 cm (axial series 3, image 65). This is of doubtful clinical significance and may represent an injection granuloma or small subcutaneous hematoma. Musculoskeletal. No acute displaced fractures. IMPRESSION: 1. No CT evidence of acute intra-abdominal or intrapelvic abnormalities. 2. There is an area of skin thickening and subcutaneous fat stranding superior to the left labia majora. Findings favored to represent a small soft tissue infection. Clinical correlation is recommended. Electronically Signed   By: Katherine Mantle M.D.   On: 02/26/2019 22:17   DG Chest Port 1 View  Result Date:  02/26/2019 CLINICAL DATA:  Chest pain. Seizure. EXAM: PORTABLE CHEST 1 VIEW COMPARISON:  01/27/2019 FINDINGS: The cardiomediastinal contours are normal. The lungs are clear. Pulmonary vasculature is normal. No consolidation, pleural effusion, or pneumothorax. No acute osseous abnormalities are seen. IMPRESSION: Negative chest radiograph. Electronically Signed   By: Narda Rutherford M.D.   On: 02/26/2019 19:21     Discharge Exam: Vitals:   02/28/19 0829 02/28/19 1107  BP: 101/65 101/61  Pulse: (!) 109 100  Resp:  20  Temp:  98.2 F (36.8 C)  SpO2:      General: Pt is alert, awake, not in acute distress Cardiovascular: RRR, S1/S2 +, no edema Respiratory: CTA bilaterally, no wheezing, no rhonchi, no respiratory distress, no conversational dyspnea  Abdominal: Soft, diffusely tender to moderate palpation, ND, bowel sounds + Extremities: no edema, no cyanosis Psych: Normal mood and affect     The results of significant diagnostics from this hospitalization (including imaging, microbiology, ancillary and laboratory) are listed below for reference.     Microbiology: Recent Results (from the past 240 hour(s))  Culture, blood (single) w Reflex to ID Panel     Status: None (Preliminary result)   Collection Time: 02/26/19  6:57 PM   Specimen: BLOOD  Result Value Ref Range Status   Specimen Description BLOOD RIGHT ANTECUBITAL  Final   Special Requests   Final    BOTTLES DRAWN AEROBIC ONLY Blood Culture results may not be optimal due to an inadequate volume of blood received in culture bottles   Culture   Final    NO GROWTH 2 DAYS Performed at [facility], [address], [place], [place] [address]    Report Status PENDING  Incomplete  SARS CORONAVIRUS 2 (TAT 6-24 HRS) Nasopharyngeal Nasopharyngeal Swab     Status: None   Collection Time: 02/26/19  9:20 PM   Specimen: Nasopharyngeal Swab  Result Value Ref Range Status   SARS Coronavirus 2 NEGATIVE NEGATIVE Final    Comment:  (NOTE) SARS-CoV-2 target nucleic acids are NOT DETECTED. The SARS-CoV-2 RNA is generally detectable in upper and lower respiratory specimens during the acute phase of infection. Negative results do not preclude SARS-CoV-2 infection, do not rule out co-infections with other pathogens, and should not be used as the sole basis for treatment or other patient management decisions. Negative results must be combined with clinical observations, patient history, and epidemiological information. The expected result is Negative. Fact Sheet for Patients: HairSlick.no Fact Sheet for Healthcare Providers: quierodirigir.com This test is not yet approved or cleared by the [place] FDA and  has been authorized for detection and/or diagnosis of SARS-CoV-2 by FDA under an Emergency Use Authorization (EUA). This EUA will remain  in effect (meaning this test can be used) for the duration of the COVID-19 declaration under Section 56 4(b)(1) of the Act, 21 U.S.C. section 360bbb-3(b)(1), unless the authorization is terminated or revoked sooner.  Performed at [facility], [place] [address] [place], [place] [address]      Labs: BNP (last 3 results) Recent Labs    09/10/18 1424 09/12/18 2244  BNP 13.1 43.1   Basic Metabolic Panel: Recent Labs  Lab 02/26/19 1918 02/26/19 1928 02/26/19 2101 02/27/19 0210 02/28/19 0107  NA 132* 133* 135 136 133*  K 4.0 4.1 3.7 4.1 4.6  CL  --  97*  --  106 99  CO2  --  19*  --  23 24  GLUCOSE  --  608*  --  213* 363*  BUN  --  7  --  12 18  CREATININE  --  0.87  --  0.49 0.95  CALCIUM  --  9.0  --  8.5* 9.1   Liver Function Tests: Recent Labs  Lab 02/26/19 1928  AST 25  ALT 25  ALKPHOS 115  BILITOT 0.6  PROT 6.8  ALBUMIN 3.5   Recent Labs  Lab 02/26/19 1928  LIPASE 20   CBC: Recent Labs  Lab 02/26/19 1928 02/26/19 2101 02/27/19 0210 02/28/19 0107 02/28/19 1043  WBC 8.2  --  9.9  7.6 6.5  HGB 13.7 14.6 12.4 11.9* 11.5*  HCT 43.8 43.0 38.9 38.2 36.6  MCV 81.9  --  81.4 83.6 82.1  PLT 366  --  350 304 308   CBG: Recent Labs  Lab 02/27/19 1103 02/27/19 1557 02/27/19 2120 02/28/19 0623 02/28/19 1106  GLUCAP 362* 332* 274* 310* 304*   Hgb A1c Recent Labs    02/27/19 0210  HGBA1C 13.4*   Urinalysis    Component Value Date/Time   COLORURINE YELLOW 02/27/2019 0944   APPEARANCEUR CLOUDY (A) 02/27/2019 0944   LABSPEC >1.046 (H) 02/27/2019 0944   PHURINE 6.0 02/27/2019 0944   GLUCOSEU >=500 (A) 02/27/2019 0944   HGBUR SMALL (A) 02/27/2019 0944   BILIRUBINUR NEGATIVE 02/27/2019 0944   BILIRUBINUR negative 07/13/2018 [place] 02/27/2019 0944   PROTEINUR NEGATIVE 02/27/2019 0944   UROBILINOGEN 0.2 07/13/2018 1651   UROBILINOGEN 1.0 07/09/2017 1324   NITRITE NEGATIVE 02/27/2019 [place] NEGATIVE 02/27/2019 0944   Microbiology Recent Results (from the past 240 hour(s))  Culture, blood (single) w Reflex to ID Panel     Status: None (Preliminary result)   Collection Time: 02/26/19  6:57 PM   Specimen: BLOOD  Result Value Ref Range Status   Specimen Description BLOOD RIGHT ANTECUBITAL  Final   Special Requests   Final    BOTTLES DRAWN AEROBIC ONLY Blood Culture results may not be optimal due to an inadequate volume of blood received in culture bottles   Culture   Final    NO GROWTH 2 DAYS Performed at [facility], [place] [address], [place], [place] [address]    Report Status PENDING  Incomplete  SARS CORONAVIRUS 2 (TAT 6-24 HRS) Nasopharyngeal Nasopharyngeal Swab     Status: None   Collection Time: 02/26/19  9:20 PM   Specimen: Nasopharyngeal Swab  Result Value Ref Range Status   SARS Coronavirus 2 NEGATIVE NEGATIVE Final    Comment: (NOTE) SARS-CoV-2 target nucleic acids are NOT DETECTED. The SARS-CoV-2 RNA is generally detectable in upper and lower respiratory specimens during the acute phase of infection.  Negative results do not preclude SARS-CoV-2 infection, do not rule out co-infections with other pathogens, and should not be used as the sole basis for treatment or other patient management decisions. Negative results must be combined with  clinical observations, patient history, and epidemiological information. The expected result is Negative. Fact Sheet for Patients: HairSlick.no Fact Sheet for Healthcare Providers: quierodirigir.com This test is not yet approved or cleared by the [place] FDA and  has been authorized for detection and/or diagnosis of SARS-CoV-2 by FDA under an Emergency Use Authorization (EUA). This EUA will remain  in effect (meaning this test can be used) for the duration of the COVID-19 declaration under Section 56 4(b)(1) of the Act, 21 U.S.C. section 360bbb-3(b)(1), unless the authorization is terminated or revoked sooner. Performed at [facility], [address] [place], [place] [address]      Patient was seen and examined on the day of discharge and was found to be in stable condition. Time coordinating discharge: 35 minutes including assessment and coordination of care, as well as examination of the patient.   SIGNED:  Sharlene Dory, DO [facility] 02/28/2019, 2:13 PM

## 2019-02-28 NOTE — Plan of Care (Signed)
  Problem: Clinical Measurements: Goal: Respiratory complications will improve Outcome: Progressing Goal: Cardiovascular complication will be avoided Outcome: Progressing   Problem: Health Behavior/Discharge Planning: Goal: Ability to manage health-related needs will improve Outcome: Not Progressing   

## 2019-03-03 LAB — CULTURE, BLOOD (SINGLE): Culture: NO GROWTH

## 2019-03-21 ENCOUNTER — Encounter: Payer: Self-pay | Admitting: Gastroenterology

## 2019-03-21 ENCOUNTER — Ambulatory Visit (INDEPENDENT_AMBULATORY_CARE_PROVIDER_SITE_OTHER): Payer: Medicaid Other | Admitting: Gastroenterology

## 2019-03-21 VITALS — BP 104/70 | HR 121 | Temp 98.7°F | Ht 63.0 in | Wt 195.0 lb

## 2019-03-21 DIAGNOSIS — K5900 Constipation, unspecified: Secondary | ICD-10-CM | POA: Diagnosis not present

## 2019-03-21 DIAGNOSIS — R109 Unspecified abdominal pain: Secondary | ICD-10-CM

## 2019-03-21 DIAGNOSIS — R112 Nausea with vomiting, unspecified: Secondary | ICD-10-CM | POA: Diagnosis not present

## 2019-03-21 MED ORDER — MOTEGRITY 2 MG PO TABS: 2.0000 mg | ORAL_TABLET | Freq: Every day | ORAL | 0 refills | Status: DC

## 2019-03-21 MED ORDER — FDGARD 25-20.75 MG PO CAPS: 2.0000 | ORAL_CAPSULE | ORAL | 0 refills | Status: DC | PRN

## 2019-03-21 NOTE — Patient Instructions (Addendum)
If you are age 21 or older, your body mass index should be between 23-30. Your Body mass index is 34.54 kg/m. If this is out of the aforementioned range listed, please consider follow up with your Primary Care Provider.  If you are age 76 or younger, your body mass index should be between 19-25. Your Body mass index is 34.54 kg/m. If this is out of the aformentioned range listed, please consider follow up with your Primary Care Provider.   You have been scheduled for a gastric emptying scan at [facility] on Tuesday, 04-05-19 at 7:30am. Please arrive at prior to your appointment for registration. Please make certain not to have anything to eat or drink after midnight the night before your test. Hold all stomach medications (ex: Zofran, phenergan, Reglan) 48 hours prior to your test. If you need to reschedule your appointment, please contact radiology scheduling at [phone]. _____________________________________________________________________ A gastric-emptying study measures how long it takes for food to move through your stomach. There are several ways to measure stomach emptying. In the most common test, you eat food that contains a small amount of radioactive material. A scanner that detects the movement of the radioactive material is placed over your abdomen to monitor the rate at which food leaves your stomach. This test normally takes about 4 hours to complete. _____________________________________________________________________  Please go to the lab for a fasting Cortisol lab.  Do not eat or drink after midnight before going.  You have been given samples of FD Gard and Motegrity  Discontinue Amitiza, Reglan, and Zofran  Thank you for entrusting me with your care and for choosing [facility], Dr. Ileene Patrick

## 2019-03-21 NOTE — Progress Notes (Signed)
HPI :  19 year old female here for a follow-up visit for nausea vomiting and abdominal pain.  She has a history of insulin-dependent diabetes with frequent ER visits and admissions to the hospital for DKA, nausea vomiting and abdominal pain.  She also has a history of hypertension, seizures, conversion disorder, depression.  She was last seen by Mike Gip in October 2020.  At that time she was continued on Reglan, Protonix, Phenergan, and was given a trial of Amitiza for chronic constipation.  Unfortunately she is not been doing well lately.  She continues to have a lot of chronic symptoms of bother her.  She has nausea frequently, mostly all the time, she has vomiting roughly every other day.  She loses her appetite easily and does have early satiety with her meals.  Associated with this she has pain in her abdomen she states that is located all over, although seems to localize in the epigastric area.  She states she feels discomfort there all the time and it never goes away.  She has tried a variety of regimens for her symptoms as outlined below.  More recently her symptoms have been worsening and she had been losing weight, she thinks 36 pounds over the past 3 to 4 months.  She is had difficulty controlling her blood sugars, her A1c is most recently 13.4.  She has trouble with insomnia which she thinks makes her symptoms worse otherwise.  She has normally 2 bowel movements a month or so.  She states on Amitiza it has not really helped too much.  However more recently she is gone 3-4 times in the past week and doing better in that regard.   Her regimen for her nausea includes Compazine suppositories, Phenergan suppositories which she thinks works the best.  She has been taking Reglan for several weeks now and states she has not had any significant benefit.  She has been taking Protonix 40 mg twice a day, Pepcid as needed, Zofran has helped.  She does not like Zofran it does not help her at all.   She has also taking BuSpar 10 mg 3 times a day and Remeron 7.5 mg nightly.  She has taken Bentyl as well for pain which does not help.  She has complained of hematemesis periodically through this time.  She is had 2 EGDs in the past for this issue which have been normal other than retained fluid.  Most recent CBC about 3 weeks ago showed a hemoglobin 11.5 with an MCV of 82.  Looking through the chart she has had numerous imaging studies for these complaints.  She has suspected gastroparesis however her gastric emptying study in 2019 was negative.  I see that she has had 7 CT scans in our system within the past year itself, none of these have shown any concerning pathology regarding her symptoms.  She has had abdominal ultrasound x3 in the past year, these have not shown any problems with her gallbladder abnormalities to account for symptoms.  She emphatically denies any marijuana use.  She has a hard time identifying clear triggers for her symptoms.  Her abdominal pain is constant and present all the time, she is use lidocaine patches and some Voltaren cream which she does not think helps too much.  Eating definitely triggers her nausea vomiting and early satiety.    CT abdomen 02/26/19 - IMPRESSION: 1. No CT evidence of acute intra-abdominal or intrapelvic abnormalities. 2. There is an area of skin thickening and subcutaneous fat  stranding superior to the left labia majora. Findings favored to represent a small soft tissue infection. Clinical correlation is Recommended.  [place] 01/29/19 - IMPRESSION: Normal abdominal ultrasound.  CT scan 12/19/18: IMPRESSION: 1. No explanation for back pain.  No obstructive uropathy. 2. Normal appendix. 3. Bladder is distended.  CT scan 10/08/18: IMPRESSION: No acute abnormality abdomen or pelvis. No finding to explain the patient's symptoms.  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   GES  05/26/17 - normal  EGD 10/12/18 - Few white plaques were found at the gastroesophageal junction, 36 cm from Incisors. Biopsies were taken with a cold forceps for histology. Findings: Some retained food in the stomach limiting examination. Mild inflammation characterized by erythema was found in the gastric antrum. Biopsies were taken with a cold forceps for histology. The examined duodenum was normal. Biopsies for histology were taken with a cold forceps for evaluation of celiac disease.  EGD 04/27/2017 - Esophagogastric landmarks were identified: the Z-line was found at 36 cm, the gastroesophageal junction was found at 36 cm and the upper extent of the gastric folds was found at 36 cm from the incisors. Findings: - The exam of the esophagus was otherwise normal. - Excessive retained fluid was found in the gastric body, raising concern for gastroparesis. - The exam of the stomach was otherwise normal. - Biopsies were taken with a cold forceps in the gastric body, at the incisura and in the gastric antrum for Helicobacter pylori testing. - The duodenal bulb and second portion of the duodenum were normal.     Past Medical History:  Diagnosis Date   Acanthosis nigricans    Anxiety    CHF (congestive heart failure) (HCC)    Chronic lower back pain    Depression    DKA, type 1 (HCC) 09/13/2018   Dyspepsia    Obesity    Ovarian cyst    pt is not aware of this hx (11/24/2017)   Precocious adrenarche (HCC)    Premature baby    Seizures (HCC)    Type II diabetes mellitus (HCC)    insulin dependant     Past Surgical History:  Procedure Laterality Date   ABDOMINAL HERNIA REPAIR     "I was a baby"   BIOPSY  10/12/2018   Procedure: BIOPSY;  Surgeon: Lynann Bologna, MD;  Location: [facility] ENDOSCOPY;  Service: Endoscopy;;   ESOPHAGOGASTRODUODENOSCOPY (EGD) WITH PROPOFOL N/A 10/12/2018   Procedure: ESOPHAGOGASTRODUODENOSCOPY (EGD) WITH PROPOFOL;  Surgeon: Lynann Bologna, MD;   Location: [facility] ENDOSCOPY;  Service: Endoscopy;  Laterality: N/A;   HERNIA REPAIR     LEFT HEART CATH AND CORONARY ANGIOGRAPHY N/A 10/13/2018   Procedure: LEFT HEART CATH AND CORONARY ANGIOGRAPHY;  Surgeon: Kathleene Hazel, MD;  Location: [facility] INVASIVE CV LAB;  Service: Cardiovascular;  Laterality: N/A;   TONSILLECTOMY AND ADENOIDECTOMY     WISDOM TOOTH EXTRACTION  2017   Family History  Problem Relation Age of Onset   Diabetes Mother    Hypertension Mother    Obesity Mother    Asthma Mother    Allergic rhinitis Mother    Eczema Mother    Cervical cancer Mother    Diabetes Father    Hypertension Father    Obesity Father    Hyperlipidemia Father    Hypertension Paternal Aunt    Hypertension Maternal Grandfather    Colon cancer Maternal Grandfather    Diabetes Paternal Grandmother    Obesity  Paternal Grandmother    Diabetes Paternal Grandfather    Obesity Paternal Grandfather    Angioedema Neg Hx    Immunodeficiency Neg Hx    Urticaria Neg Hx    Stomach cancer Neg Hx    Esophageal cancer Neg Hx    Social History   Tobacco Use   Smoking status: Never Smoker   Smokeless tobacco: Never Used  Substance Use Topics   Alcohol use: No    Alcohol/week: 0.0 standard drinks   Drug use: No   Current Outpatient Medications  Medication Sig Dispense Refill   atorvastatin (LIPITOR) 20 MG tablet Take 1 tablet (20 mg total) by mouth daily at 6 PM. 30 tablet 1   bisacodyl (DULCOLAX) 10 MG suppository Place 1 suppository (10 mg total) rectally daily. 12 suppository 0   busPIRone (BUSPAR) 10 MG tablet Take 10 mg by mouth 3 (three) times daily.      cycloSPORINE (RESTASIS) 0.05 % ophthalmic emulsion Place 1 drop into both eyes 2 (two) times daily.     hydrOXYzine (ATARAX/VISTARIL) 50 MG tablet Take 1 tablet (50 mg total) by mouth 3 (three) times daily as needed for anxiety. (Patient taking differently: Take 50 mg by mouth 3 (three) times daily. ) 75  tablet 0   insulin aspart (NOVOLOG FLEXPEN) 100 UNIT/ML FlexPen Inject 15 Units into the skin 3 (three) times daily with meals.     insulin glargine (LANTUS) 100 UNIT/ML injection Inject 0.6 mLs (60 Units total) into the skin at bedtime. For diabetes management (Patient taking differently: Inject 70 Units into the skin at bedtime. For diabetes management) 10 mL 0   lamoTRIgine (LAMICTAL) 25 MG tablet Take 2 tablets (50 mg total) by mouth 2 (two) times daily. For mood stabilization 60 tablet 0   lubiprostone (AMITIZA) 24 MCG capsule Take 1 capsule (24 mcg total) by mouth 2 (two) times daily with a meal. 60 capsule 6   metoCLOPramide (REGLAN) 10 MG tablet Take 1 tablet (10 mg total) by mouth 4 (four) times daily -  before meals and at bedtime. (Patient taking differently: Take 10 mg by mouth every 6 (six) hours as needed for nausea or vomiting. ) 120 tablet 0   mirtazapine (REMERON) 7.5 MG tablet Take 1 tablet (7.5 mg total) by mouth at bedtime. For depression/sleep 30 tablet 0   Multiple Vitamin (MULTIVITAMIN WITH MINERALS) TABS tablet Take 1 tablet by mouth daily. 30 tablet 1   Olopatadine HCl (PAZEO) 0.7 % SOLN Place 2 drops into both eyes 2 (two) times daily.      pantoprazole (PROTONIX) 40 MG tablet Take 1 tablet (40 mg total) by mouth 2 (two) times daily. For acid reflux 30 tablet 1   polyethylene glycol (MIRALAX / GLYCOLAX) 17 g packet Take 17 g by mouth daily. 14 each 0   prochlorperazine (COMPAZINE) 25 MG suppository Place 1 suppository (25 mg total) rectally every 12 (twelve) hours as needed for nausea or vomiting. 12 suppository 0   promethazine (PHENERGAN) 25 MG suppository Place 25 mg rectally every 8 (eight) hours as needed for nausea or vomiting.     propranolol (INDERAL) 20 MG tablet Take 1 tablet (20 mg total) by mouth 2 (two) times daily. For anxiety/HTN 60 tablet 0   QUEtiapine (SEROQUEL) 200 MG tablet Take 2 tablets (400 mg total) by mouth at bedtime. 28 tablet 0    topiramate (TOPAMAX) 25 MG tablet Take 1 tablet (25 mg total) by mouth 2 (two) times daily. For seizure activities 60  tablet 0   No current facility-administered medications for this visit.   Allergies  Allergen Reactions   Ibuprofen Other (See Comments)    GI MD said to not take this anymore   Oatmeal Rash     Review of Systems: All systems reviewed and negative except where noted in HPI.    CT ABDOMEN PELVIS W CONTRAST  Result Date: 02/26/2019 CLINICAL DATA:  Abdominal pain and hematemesis. EXAM: CT ABDOMEN AND PELVIS WITH CONTRAST TECHNIQUE: Multidetector CT imaging of the abdomen and pelvis was performed using the standard protocol following bolus administration of intravenous contrast. CONTRAST:  OMNIPAQUE IOHEXOL 300 MG/ML  SOLN COMPARISON:  CT dated December 19, 2018. FINDINGS: Lower chest: The lung bases are clear. The heart size is normal. Hepatobiliary: The liver is normal. Normal gallbladder.There is no biliary ductal dilation. Pancreas: Normal contours without ductal dilatation. No peripancreatic fluid collection. Spleen: No splenic laceration or hematoma. Adrenals/Urinary Tract: --Adrenal glands: No adrenal hemorrhage. --Right kidney/ureter: No hydronephrosis or perinephric hematoma. --Left kidney/ureter: No hydronephrosis or perinephric hematoma. --Urinary bladder: Unremarkable. Stomach/Bowel: --Stomach/Duodenum: No hiatal hernia or other gastric abnormality. Normal duodenal course and caliber. --Small bowel: No dilatation or inflammation. --Colon: No focal abnormality. --Appendix: Normal. Vascular/Lymphatic: Normal course and caliber of the major abdominal vessels. --No retroperitoneal lymphadenopathy. --No mesenteric lymphadenopathy. --No pelvic or inguinal lymphadenopathy. Reproductive: Superior to the left labia majora there is an area of skin thickening and subcutaneous fat induration (axial series 3, image 91). Other: No ascites or free air. There is a new nodule within  the subcutaneous fat of the anterior abdominal wall on the right measuring approximately 1.5 cm (axial series 3, image 65). This is of doubtful clinical significance and may represent an injection granuloma or small subcutaneous hematoma. Musculoskeletal. No acute displaced fractures. IMPRESSION: 1. No CT evidence of acute intra-abdominal or intrapelvic abnormalities. 2. There is an area of skin thickening and subcutaneous fat stranding superior to the left labia majora. Findings favored to represent a small soft tissue infection. Clinical correlation is recommended. Electronically Signed   By: Katherine Mantle M.D.   On: 02/26/2019 22:17   DG Chest Port 1 View  Result Date: 02/26/2019 CLINICAL DATA:  Chest pain. Seizure. EXAM: PORTABLE CHEST 1 VIEW COMPARISON:  01/27/2019 FINDINGS: The cardiomediastinal contours are normal. The lungs are clear. Pulmonary vasculature is normal. No consolidation, pleural effusion, or pneumothorax. No acute osseous abnormalities are seen. IMPRESSION: Negative chest radiograph. Electronically Signed   By: Narda Rutherford M.D.   On: 02/26/2019 19:21    Lab Results  Component Value Date   WBC 6.5 02/28/2019   HGB 11.5 (L) 02/28/2019   HCT 36.6 02/28/2019   MCV 82.1 02/28/2019   PLT 308 02/28/2019    Lab Results  Component Value Date   CREATININE 0.95 02/28/2019   BUN 18 02/28/2019   NA 133 (L) 02/28/2019   K 4.6 02/28/2019   CL 99 02/28/2019   CO2 24 02/28/2019    Lab Results  Component Value Date   ALT 25 02/26/2019   AST 25 02/26/2019   ALKPHOS 115 02/26/2019   BILITOT 0.6 02/26/2019    Lab Results  Component Value Date   LIPASE 20 02/26/2019      Physical Exam: Temp 98.7 F (37.1 C)    Ht 5\' 3"  (1.6 m)    Wt 195 lb (88.5 kg)    BMI 34.54 kg/m  Constitutional: Pleasant,well-developed, female in no acute distress. Abdominal: Soft, nondistended, nontender.  There are no  masses palpable. No hepatomegaly. Extremities: no  edema Lymphadenopathy: No cervical adenopathy noted. Neurological: Alert and oriented to person place and time. Skin: Skin is warm and dry. No rashes noted. Psychiatric: Normal mood and affect. Behavior is normal.   ASSESSMENT AND PLAN: 19 year old female here for reassessment the following:  Chronic nausea vomiting / chronic abdominal pain / constipation - very difficult situation, numerous ED visits and hospitalizations with fluctuating severity of symptoms.  Her diabetes is poorly controlled with an A1c in the 13's which I think may be driving some of the symptoms..  It has been suspected she has had gastroparesis as her symptoms are quite consistent with that and she is had negative endoscopic and radiographic imaging to date. She has not had any benefit with Reglan at all however and symptoms persist.  No cause of hematemesis on EGD x2.  Unclear if she had a Mallory-Weiss tear in the setting of multiple episodes of vomiting that led to hematemesis. I think it may be reasonable to repeat a gastric emptying study at this time off meds to help determine if she truly has gastroparesis or not.  If this confirms our suspicion of gastroparesis, may refer her to a tertiary motility center for consideration of a gastric pacemaker.  Would likely give a trial of domperidone in the interim however we discussed this and she is hesitant to try it.  Reglan clearly has not helped at all so we will stop that.  Zofran does not help at all and we will stop that.  I will give her some samples of FD guard today to see if that will help some of her dyspepsia.  She can otherwise continue Phenergan, and continue with her buspirone and Remeron, both of which should help dyspepsia.  Regarding her chronic constipation, given no improvement with Amitiza we will stop that and we will switch her to [facility], of which there is some evidence to support its use and gastroparesis and will see if that helps.  She knows she needs to  stop this prior to her gastric emptying study.  I will otherwise send a fasting cortisol level to screen for adrenal insufficiency in case this could be contributing.  Patient is understanding of the situation and her options, she is very frustrated by her course to date however hopefully we can sort this out a bit with her work-up and put her on a regimen that works better for her.  She agreed with the plan  Kalihiwai Cellar, MD [facility]

## 2019-04-01 ENCOUNTER — Encounter (HOSPITAL_COMMUNITY): Payer: Self-pay | Admitting: Emergency Medicine

## 2019-04-01 ENCOUNTER — Emergency Department (HOSPITAL_COMMUNITY)
Admission: EM | Admit: 2019-04-01 | Discharge: 2019-04-02 | Disposition: A | Discharge status: Home / Self Care | Payer: Medicaid Other | Attending: Emergency Medicine | Admitting: Emergency Medicine

## 2019-04-01 ENCOUNTER — Other Ambulatory Visit: Payer: Self-pay

## 2019-04-01 DIAGNOSIS — E109 Type 1 diabetes mellitus without complications: Secondary | ICD-10-CM | POA: Insufficient documentation

## 2019-04-01 DIAGNOSIS — I509 Heart failure, unspecified: Secondary | ICD-10-CM | POA: Diagnosis not present

## 2019-04-01 DIAGNOSIS — Z79899 Other long term (current) drug therapy: Secondary | ICD-10-CM | POA: Diagnosis not present

## 2019-04-01 DIAGNOSIS — R109 Unspecified abdominal pain: Secondary | ICD-10-CM | POA: Insufficient documentation

## 2019-04-01 DIAGNOSIS — I110 Hypertensive heart disease with heart failure: Secondary | ICD-10-CM | POA: Insufficient documentation

## 2019-04-01 DIAGNOSIS — G8929 Other chronic pain: Secondary | ICD-10-CM | POA: Diagnosis not present

## 2019-04-01 DIAGNOSIS — R110 Nausea: Secondary | ICD-10-CM

## 2019-04-01 LAB — CBC
HCT: 38.3 % (ref 36.0–46.0)
Hemoglobin: 12 g/dL (ref 12.0–15.0)
MCH: 25.9 pg — ABNORMAL LOW (ref 26.0–34.0)
MCHC: 31.3 g/dL (ref 30.0–36.0)
MCV: 82.5 fL (ref 80.0–100.0)
Platelets: 323 10*3/uL (ref 150–400)
RBC: 4.64 MIL/uL (ref 3.87–5.11)
RDW: 13.6 % (ref 11.5–15.5)
WBC: 8.1 10*3/uL (ref 4.0–10.5)
nRBC: 0 % (ref 0.0–0.2)

## 2019-04-01 LAB — I-STAT BETA HCG BLOOD, ED (MC, WL, AP ONLY): I-stat hCG, quantitative: <5 m[IU]/mL (ref <5)

## 2019-04-01 LAB — CBG MONITORING, ED: Glucose-Capillary: 411 mg/dL — ABNORMAL HIGH (ref 70–99)

## 2019-04-01 MED ORDER — SODIUM CHLORIDE 0.9% FLUSH: 3.0000 mL | Freq: Once | INTRAVENOUS | Status: DC

## 2019-04-01 NOTE — ED Triage Notes (Signed)
Pt arrived via EMS from home, pt c/o emesis with EMS, pt states the last few have contained bright red blood, pt ambulatory to [facility]. 22 L hand by EMS, 1L NS given.  Pt reports to this nurse she is having "passing out" episodes for last 2 days, up to 12 a day. Pt reports no injuries, pt states she spoke with her MD via phone yesterday and was told to come to ED. Pt reports she has tried Tylenol, naproxen, lidocaine patches with no relief.

## 2019-04-02 LAB — COMPREHENSIVE METABOLIC PANEL
ALT: 15 U/L (ref 0–44)
AST: 11 U/L — ABNORMAL LOW (ref 15–41)
Albumin: 3.5 g/dL (ref 3.5–5.0)
Alkaline Phosphatase: 90 U/L (ref 38–126)
Anion gap: 13 (ref 5–15)
BUN: 7 mg/dL (ref 6–20)
CO2: 25 mmol/L (ref 22–32)
Calcium: 9.4 mg/dL (ref 8.9–10.3)
Chloride: 102 mmol/L (ref 98–111)
Creatinine, Ser: 0.85 mg/dL (ref 0.44–1.00)
GFR calc Af Amer: >60 mL/min (ref >60)
GFR calc non Af Amer: >60 mL/min (ref >60)
Glucose, Bld: 352 mg/dL — ABNORMAL HIGH (ref 70–99)
Potassium: 4.2 mmol/L (ref 3.5–5.1)
Sodium: 140 mmol/L (ref 135–145)
Total Bilirubin: 0.5 mg/dL (ref 0.3–1.2)
Total Protein: 6.3 g/dL — ABNORMAL LOW (ref 6.5–8.1)

## 2019-04-02 LAB — LIPASE, BLOOD: Lipase: 29 U/L (ref 11–51)

## 2019-04-02 MED ORDER — PROMETHAZINE HCL 25 MG/ML IJ SOLN
12.5000 mg | Freq: Once | INTRAMUSCULAR | Status: AC
  Administered 2019-04-02: 12.5 mg via INTRAVENOUS
  Filled 2019-04-02: qty 1

## 2019-04-02 MED ORDER — FENTANYL CITRATE (PF) 100 MCG/2ML IJ SOLN
50.0000 ug | Freq: Once | INTRAMUSCULAR | Status: AC
  Administered 2019-04-02: 50 ug via INTRAVENOUS
  Filled 2019-04-02: qty 2

## 2019-04-02 MED ORDER — INSULIN GLARGINE 100 UNIT/ML ~~LOC~~ SOLN
60.0000 [IU] | Freq: Once | SUBCUTANEOUS | Status: AC
  Administered 2019-04-02: 60 [IU] via SUBCUTANEOUS
  Filled 2019-04-02: qty 0.6

## 2019-04-02 MED ORDER — SODIUM CHLORIDE 0.9 % IV BOLUS
1000.0000 mL | Freq: Once | INTRAVENOUS | Status: AC
  Administered 2019-04-02: 1000 mL via INTRAVENOUS

## 2019-04-02 NOTE — ED Notes (Signed)
Pt was discharged from the ED. Pt read and understood discharge paperwork. Pt had vital signs completed. Pt conscious, breathing, and A&Ox4. No distress noted. Pt speaking in complete sentences. Pt ambulated out of the ED with a smooth and steady gait. E-signature not available.  

## 2019-04-02 NOTE — ED Provider Notes (Signed)
[facility] EMERGENCY DEPARTMENT Provider Note   CSN: [id number] Arrival date & time: 04/01/19  2102     History Chief Complaint  Patient presents with  . Abdominal Pain    Nancy [name] is a 19 y.o. female.  Patient with history of T1DM, chronic abdominal pain, chronic nausea, CHF, pseudoseizures, dyspepsia presents with abdominal pain, nausea and vomiting. She reports seeing blood in her emesis. She is using Phenergan and Compazine suppositories at home without relief. She states her last episode emesis was about 20 minutes ago. She has chronic constipation and reports no change in this. She is followed by Dr. Adela Lank and was last seen on the 15th. He changed her medication regimen and she reports she is taking medications without any relief. She also reports she has been passing out today, "at least 12 times".   The history is provided by the patient. No language interpreter was used.  Abdominal Pain Associated symptoms: constipation, nausea and vomiting   Associated symptoms: no chills and no fever        Past Medical History:  Diagnosis Date  . Acanthosis nigricans   . Anxiety   . CHF (congestive heart failure) (HCC)   . Chronic lower back pain   . Depression   . DKA, type 1 (HCC) 09/13/2018  . Dyspepsia   . Obesity   . Ovarian cyst    pt is not aware of this hx (11/24/2017)  . 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 Premature baby   . Seizures (HCC)   . Type II diabetes mellitus (HCC)    insulin dependant    Patient Active Problem List   Diagnosis Date Noted  . Cellulitis   . DKA (diabetic ketoacidoses) (HCC) 01/27/2019  . Leukocytosis 12/19/2018  . Dehydration   . Pseudoseizures   . Type 1 diabetes mellitus with ketoacidosis without coma (HCC) 11/28/2018  . Hyperglycemia due to diabetes mellitus (HCC) 11/27/2018  . 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  . MDD (major depressive disorder), recurrent  episode, severe (HCC) 11/06/2018  . Nonspecific abnormal electrocardiogram (ECG) (EKG)   . Chest pain of uncertain etiology   . Hypertensive urgency 10/08/2018  . Conversion disorder with attacks or seizures, acute episode, with psychological stressor 09/16/2018  . MDD (major depressive disorder), recurrent severe, without psychosis (HCC)   . AKI (acute kidney injury) (HCC) 08/04/2018  . Seizures (HCC) 08/03/2018  . Depression 07/25/2018  . Syncope 01/30/2018  . Orthostatic hypotension 01/24/2018  . Tachycardia 12/28/2017  . Chronic abdominal pain 12/24/2017  . Chest pain 12/19/2017  . Nausea and vomiting 08/21/2017  . Generalized abdominal pain 08/21/2017  . Non compliance with medical treatment 01/27/2012  . Adjustment disorder 09/16/2011  . Acanthosis nigricans   . Goiter   . Obesity 06/14/2010  . Hypertension 06/14/2010    Past Surgical History:  Procedure Laterality Date  . ABDOMINAL HERNIA REPAIR     "I was a baby"  . BIOPSY  10/12/2018   Procedure: BIOPSY;  Surgeon: Lynann Bologna, MD;  Location: [facility] ENDOSCOPY;  Service: Endoscopy;;  . ESOPHAGOGASTRODUODENOSCOPY (EGD) WITH PROPOFOL N/A 10/12/2018   Procedure: ESOPHAGOGASTRODUODENOSCOPY (EGD) WITH PROPOFOL;  Surgeon: Lynann Bologna, MD;  Location: [facility] ENDOSCOPY;  Service: Endoscopy;  Laterality: N/A;  . HERNIA REPAIR    . LEFT HEART CATH AND CORONARY ANGIOGRAPHY N/A 10/13/2018   Procedure: LEFT HEART CATH AND CORONARY ANGIOGRAPHY;  Surgeon: Kathleene Hazel, MD;  Location: [facility] INVASIVE CV LAB;  Service: Cardiovascular;  Laterality: N/A;  . TONSILLECTOMY AND ADENOIDECTOMY    . WISDOM TOOTH EXTRACTION  2017     OB History   No obstetric history on file.     Family History  Problem Relation Age of Onset  . Diabetes Mother   . Hypertension Mother   . Obesity Mother   . Asthma Mother   . Allergic rhinitis Mother   . Eczema Mother   . Cervical cancer Mother   . Diabetes Father   . Hypertension Father   . Obesity  Father   . Hyperlipidemia Father   . Hypertension Paternal Aunt   . Hypertension Maternal Grandfather   . Colon cancer Maternal Grandfather   . Diabetes Paternal Grandmother   . Obesity Paternal Grandmother   . Diabetes Paternal Grandfather   . Obesity Paternal Grandfather   . Angioedema Neg Hx   . Immunodeficiency Neg Hx   . Urticaria Neg Hx   . Stomach cancer Neg Hx   . Esophageal cancer Neg Hx     Social History   Tobacco Use  . Smoking status: Never Smoker  . Smokeless tobacco: Never Used  Substance Use Topics  . Alcohol use: No    Alcohol/week: 0.0 standard drinks  . Drug use: No    Home Medications Prior to Admission medications   Medication Sig Start Date End Date Taking? Authorizing Provider  atorvastatin (LIPITOR) 20 MG tablet Take 1 tablet (20 mg total) by mouth daily at 6 PM. 10/05/18   Elsie Stain, MD  bisacodyl (DULCOLAX) 10 MG suppository Place 1 suppository (10 mg total) rectally daily. 12/24/18   Swayze, Ava, DO  busPIRone (BUSPAR) 10 MG tablet Take 10 mg by mouth 3 (three) times daily.  11/19/18   [provider]  Caraway Oil-Levomenthol (FDGARD) 25-20.75 MG CAPS Take 2 capsules by mouth as needed. Take as directed as needed 03/21/19   Armbruster, Carlota Raspberry, MD  cycloSPORINE (RESTASIS) 0.05 % ophthalmic emulsion Place 1 drop into both eyes 2 (two) times daily.    [provider]  hydrOXYzine (ATARAX/VISTARIL) 50 MG tablet Take 1 tablet (50 mg total) by mouth 3 (three) times daily as needed for anxiety. Patient taking differently: Take 50 mg by mouth 3 (three) times daily.  11/11/18   Lindell Spar I, NP  insulin aspart (NOVOLOG FLEXPEN) 100 UNIT/ML FlexPen Inject 15 Units into the skin 3 (three) times daily with meals.    [provider]  insulin glargine (LANTUS) 100 UNIT/ML injection Inject 0.6 mLs (60 Units total) into the skin at bedtime. For diabetes management Patient taking differently: Inject 70 Units into the skin at  bedtime. For diabetes management 11/11/18   Lindell Spar I, NP  lamoTRIgine (LAMICTAL) 25 MG tablet Take 2 tablets (50 mg total) by mouth 2 (two) times daily. For mood stabilization 11/11/18   Lindell Spar I, NP  lubiprostone (AMITIZA) 24 MCG capsule Take 1 capsule (24 mcg total) by mouth 2 (two) times daily with a meal. 10/27/18   Esterwood, Amy S, PA-C  mirtazapine (REMERON) 7.5 MG tablet Take 1 tablet (7.5 mg total) by mouth at bedtime. For depression/sleep 11/11/18   Lindell Spar I, NP  Multiple Vitamin (MULTIVITAMIN WITH MINERALS) TABS tablet Take 1 tablet by mouth daily. 09/18/18   Clapacs, Madie Reno, MD  Olopatadine HCl (PAZEO) 0.7 % SOLN Place 2 drops into both eyes 2 (two) times daily.     [provider]  pantoprazole (PROTONIX) 40 MG tablet Take 1 tablet (40 mg  total) by mouth 2 (two) times daily. For acid reflux 01/29/19   Jae Dire, MD  polyethylene glycol (MIRALAX / GLYCOLAX) 17 g packet Take 17 g by mouth daily. 12/24/18   Swayze, Ava, DO  prochlorperazine (COMPAZINE) 25 MG suppository Place 1 suppository (25 mg total) rectally every 12 (twelve) hours as needed for nausea or vomiting. 12/05/18   Melene Plan, DO  promethazine (PHENERGAN) 25 MG suppository Place 25 mg rectally every 8 (eight) hours as needed for nausea or vomiting.    [provider]  propranolol (INDERAL) 20 MG tablet Take 1 tablet (20 mg total) by mouth 2 (two) times daily. For anxiety/HTN 12/23/18   Swayze, Ava, DO  Prucalopride Succinate (MOTEGRITY) 2 MG TABS Take 1 tablet (2 mg total) by mouth daily. 03/21/19   Armbruster, Willaim Rayas, MD  QUEtiapine (SEROQUEL) 200 MG tablet Take 2 tablets (400 mg total) by mouth at bedtime. 01/15/19   Gwyneth Sprout, MD  topiramate (TOPAMAX) 25 MG tablet Take 1 tablet (25 mg total) by mouth 2 (two) times daily. For seizure activities 11/11/18   Armandina Stammer I, NP    Allergies    Ibuprofen and Oatmeal  Review of Systems   Review of Systems  Constitutional: Negative  for chills and fever.  HENT: Negative.   Respiratory: Negative.   Cardiovascular: Negative.   Gastrointestinal: Positive for abdominal pain, constipation, nausea and vomiting.       See HPI.  Genitourinary: Negative.   Musculoskeletal: Negative.   Skin: Negative.   Neurological: Positive for syncope.    Physical Exam Updated Vital Signs BP 130/80   Pulse 69   Temp 98.4 F (36.9 C)   Resp 16   LMP 03/28/2019   SpO2 97%   Physical Exam Vitals and nursing note reviewed.  Constitutional:      Appearance: She is well-developed. She is obese. She is not toxic-appearing.  HENT:     Head: Normocephalic.  Cardiovascular:     Rate and Rhythm: Normal rate and regular rhythm.     Heart sounds: No murmur.  Pulmonary:     Effort: Pulmonary effort is normal.     Breath sounds: Normal breath sounds. No wheezing, rhonchi or rales.  Abdominal:     General: Bowel sounds are normal.     Palpations: Abdomen is soft.     Tenderness: There is abdominal tenderness. There is no guarding or rebound.  Musculoskeletal:        General: Normal range of motion.     Cervical back: Normal range of motion and neck supple.  Skin:    General: Skin is warm and dry.     Findings: No rash.  Neurological:     Mental Status: She is alert and oriented to person, place, and time.     ED Results / Procedures / Treatments   Labs (all labs ordered are listed, but only abnormal results are displayed) Labs Reviewed  COMPREHENSIVE METABOLIC PANEL - Abnormal; Notable for the following components:      Result Value   Glucose, Bld 352 (*)    Total Protein 6.3 (*)    AST 11 (*)    All other components within normal limits  CBC - Abnormal; Notable for the following components:   MCH 25.9 (*)    All other components within normal limits  CBG MONITORING, ED - Abnormal; Notable for the following components:   Glucose-Capillary 411 (*)    All other components within normal limits  LIPASE,  BLOOD  URINALYSIS,  ROUTINE W REFLEX MICROSCOPIC  I-STAT BETA HCG BLOOD, ED ([facility], [facility], [facility] ONLY)    EKG None  Radiology No results found.  Procedures Procedures (including critical care time)  Medications Ordered in ED Medications  sodium chloride flush (NS) 0.9 % injection 3 mL (has no administration in time range)  sodium chloride 0.9 % bolus 1,000 mL (has no administration in time range)  promethazine (PHENERGAN) injection 12.5 mg (has no administration in time range)  insulin glargine (LANTUS) injection 60 Units (has no administration in time range)  fentaNYL (SUBLIMAZE) injection 50 mcg (has no administration in time range)    ED Course  I have reviewed the triage vital signs and the nursing notes.  Pertinent labs & imaging results that were available during my care of the patient were reviewed by me and considered in my medical decision making (see chart for details).    MDM Rules/Calculators/A&P                      Patient with history of chronic AP, N, V, often reporting hematemesis, presents with chronic symptoms.  On chart review the patient has had multiple CT scans in the last year which revealed no significant findings, including CTA's. She has had multiple abdominal US's that have been normal. She has had multiple admissions for DKA, however, no evidence DKA tonight.  Per note of 03/21/19 (Armbruster), she has undergone multiple EGD without evidence of bleeding as well as multiple gastric emptying studies that did not show delay. She has been scheduled for another on 04/05/19.   Do not feel further imaging is warranted tonight for evaluation of well documented chronic symptoms. Discussed with the patient that my goal in the ED tonight is symptom control and rehydration. Her Hgb tonight is 12.0. Doubt significant GI bleeding. Would help to visualize emesis but she has not vomited since being placed in a room. Will continue to observe.   No vomiting in ED. On re-exam, the patient is sitting  up on her phone, requesting a diet ginger ale. PO challenge provided. No vomiting afterward. She is felt appropriate for discharge and encouraged to follow up with her doctors in the outpatient setting for further management.  Final Clinical Impression(s) / ED Diagnoses Final diagnoses:  None   1. Chronic abdominal pain 2. Chronic nausea Rx / DC Orders ED Discharge Orders    None       Elpidio Anis, PA-C 04/02/19 0631    Geoffery Lyons, MD 04/02/19 [phone]

## 2019-04-02 NOTE — Discharge Instructions (Addendum)
Continue your regular medications at home. Follow up with your doctors for further management of chronic/recurrent symptoms.

## 2019-04-05 ENCOUNTER — Other Ambulatory Visit: Payer: Self-pay

## 2019-04-05 ENCOUNTER — Encounter (HOSPITAL_COMMUNITY)
Admission: RE | Admit: 2019-04-05 | Discharge: 2019-04-05 | Disposition: A | Discharge status: Home / Self Care | Payer: Medicaid Other | Source: Ambulatory Visit | Attending: Gastroenterology | Admitting: Gastroenterology

## 2019-04-05 DIAGNOSIS — R112 Nausea with vomiting, unspecified: Secondary | ICD-10-CM | POA: Insufficient documentation

## 2019-04-05 DIAGNOSIS — R109 Unspecified abdominal pain: Secondary | ICD-10-CM | POA: Insufficient documentation

## 2019-04-05 DIAGNOSIS — K5900 Constipation, unspecified: Secondary | ICD-10-CM | POA: Insufficient documentation

## 2019-04-05 MED ORDER — TECHNETIUM TC 99M SULFUR COLLOID
2.0000 | Freq: Once | INTRAVENOUS | Status: AC | PRN
  Administered 2019-04-05: 2 via INTRAVENOUS

## 2019-04-14 IMAGING — DX PORTABLE CHEST - 1 VIEW
1 series · 1 of 1 positions shown · non-contrast
Comparison: February 09, 2018

CLINICAL DATA: Chest pain and shortness of breath

EXAM:
PORTABLE CHEST 1 VIEW

[chest]
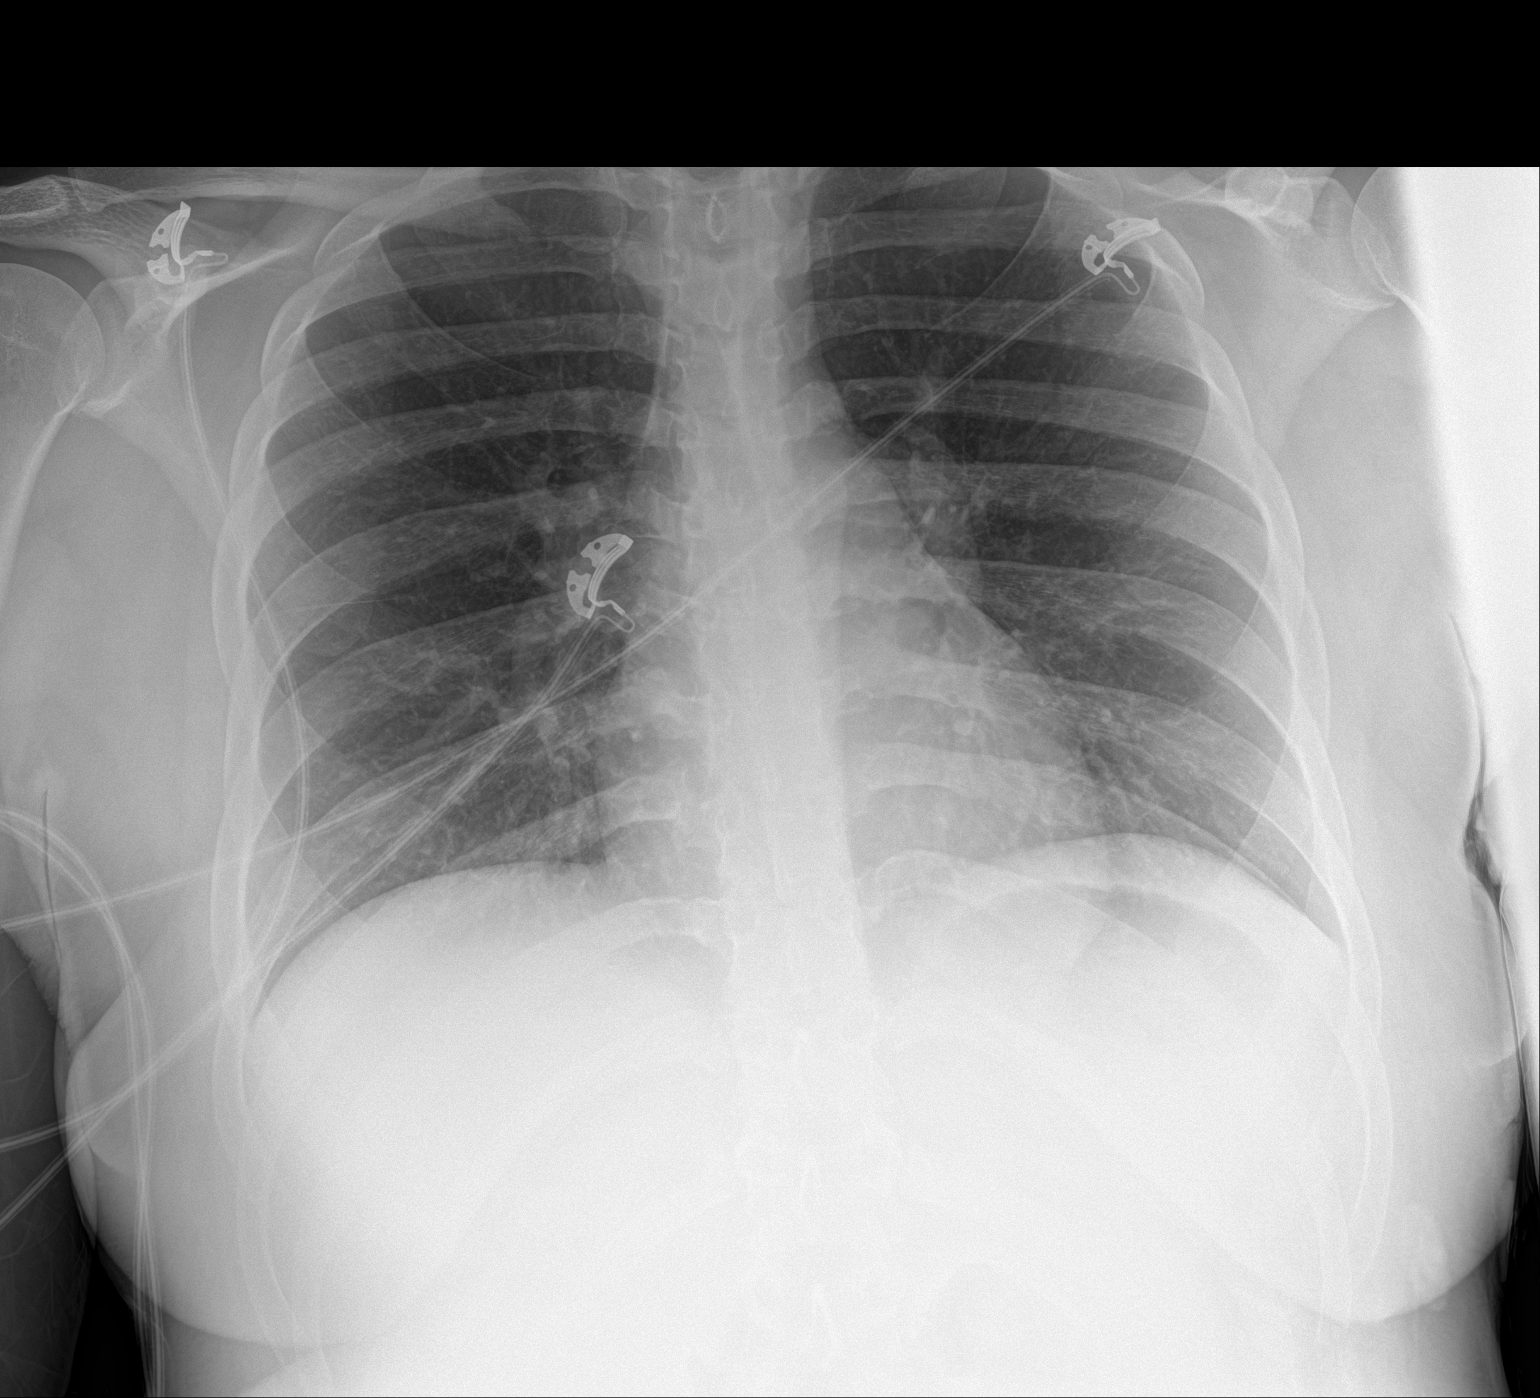

[1 of 1 positions shown; findings below may reference images not displayed]

FINDINGS: The lungs are clear. Heart size and pulmonary vascularity are
normal. No adenopathy. No bone lesions.
IMPRESSION: No edema or consolidation.

## 2019-04-27 ENCOUNTER — Other Ambulatory Visit: Payer: Self-pay | Admitting: Family Medicine

## 2019-04-28 NOTE — Telephone Encounter (Signed)
Please advise 

## 2019-05-02 ENCOUNTER — Other Ambulatory Visit (INDEPENDENT_AMBULATORY_CARE_PROVIDER_SITE_OTHER): Payer: Medicaid Other

## 2019-05-02 DIAGNOSIS — R112 Nausea with vomiting, unspecified: Secondary | ICD-10-CM | POA: Diagnosis not present

## 2019-05-02 DIAGNOSIS — R109 Unspecified abdominal pain: Secondary | ICD-10-CM | POA: Diagnosis not present

## 2019-05-02 DIAGNOSIS — K5900 Constipation, unspecified: Secondary | ICD-10-CM

## 2019-05-02 LAB — CORTISOL: Cortisol, Plasma: 10.1 ug/dL

## 2019-05-03 ENCOUNTER — Telehealth: Payer: Self-pay | Admitting: Gastroenterology

## 2019-05-03 NOTE — Telephone Encounter (Signed)
Left message for patient to call back  

## 2019-05-03 NOTE — Telephone Encounter (Signed)
Pt called to inform that she has been vomiting blood since 3 days ago and today she has been experiencing generalized abd pain. Pls call her.

## 2019-05-04 NOTE — Telephone Encounter (Signed)
Left message for patient to call back  

## 2019-05-05 NOTE — Telephone Encounter (Signed)
Left message for patient to call back  

## 2019-05-06 NOTE — Telephone Encounter (Signed)
No return call from the patient 

## 2019-06-14 IMAGING — CT CT HEAD WITHOUT CONTRAST
4 series · 17 of 47 positions shown, 19 images · non-contrast
Comparison: 11/23/2017

CLINICAL DATA: Multiple seizures, posterior head trauma

EXAM:
CT HEAD WITHOUT CONTRAST
TECHNIQUE: Contiguous axial images were obtained from the base of the skull
through the vertex without intravenous contrast.

[Series 3: head without · axial · non-contrast · 0.40mm/px · z∈[-610,-500]mm · 7 of 30 slices shown, 9 images]
[im 4/30  brain]
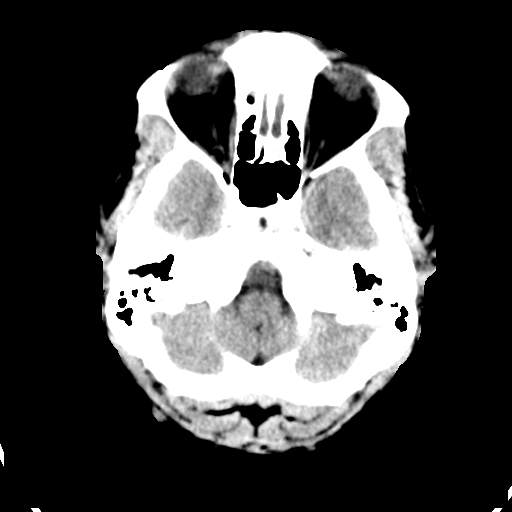
[im 4/30  bone]
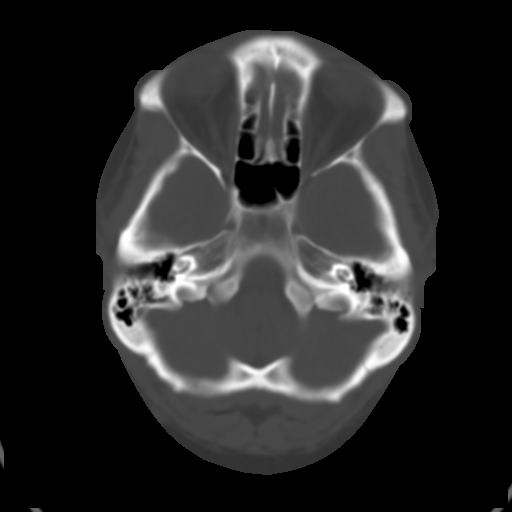
[im 8/30  brain]
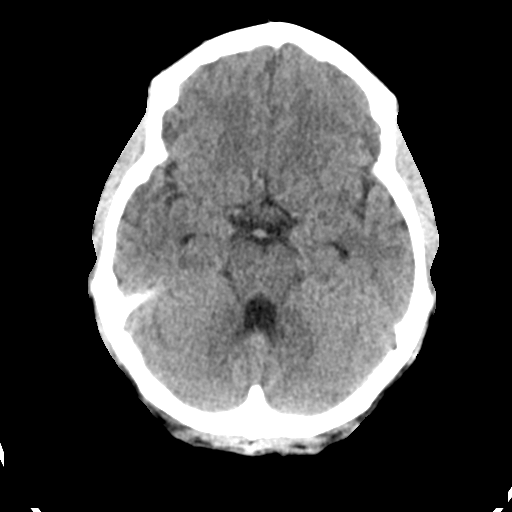
[im 11/30  brain]
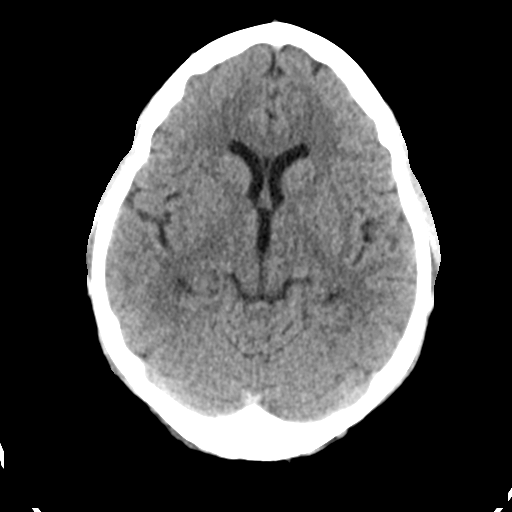
[im 15/30  brain]
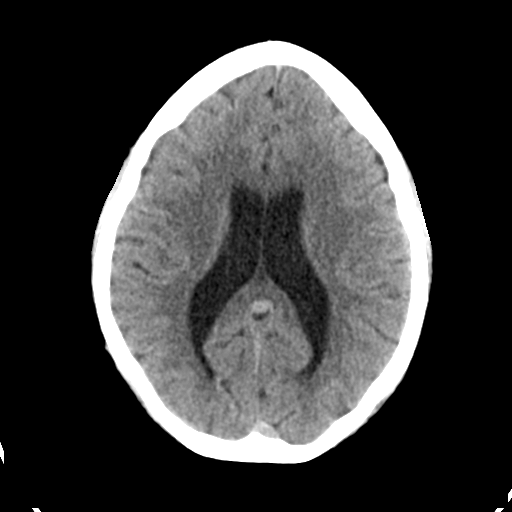
[im 19/30  brain]
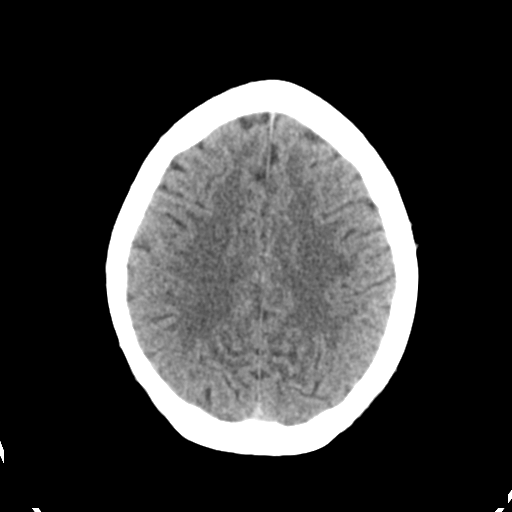
[im 19/30  bone]
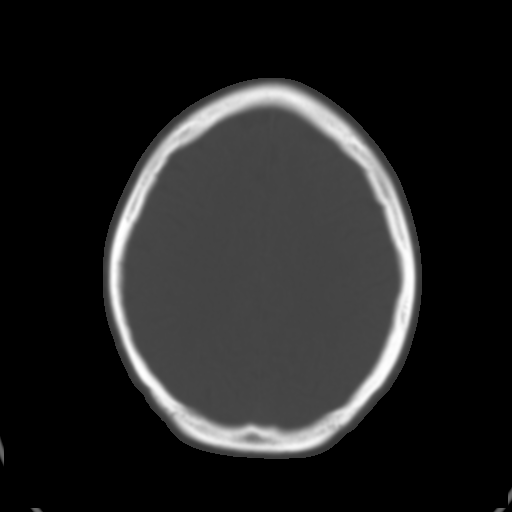
[im 22/30  brain]
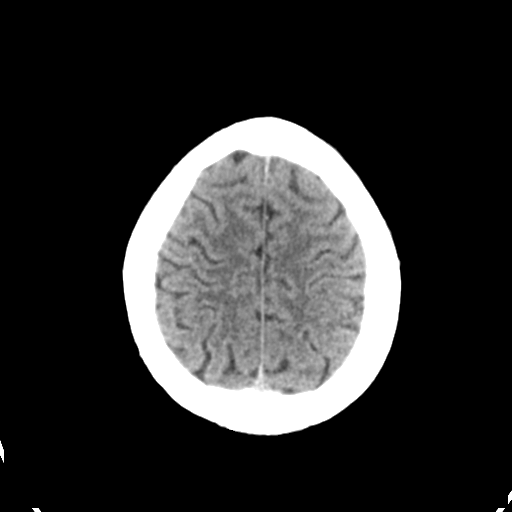
[im 26/30  brain]
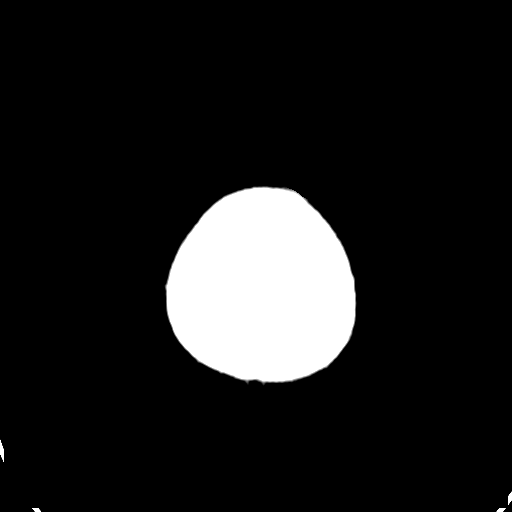

[Series 4: head bone · axial · 0.40mm/px · z∈[-611,-561]mm · 4 of 74 slices shown]
[im 8/74  bone]
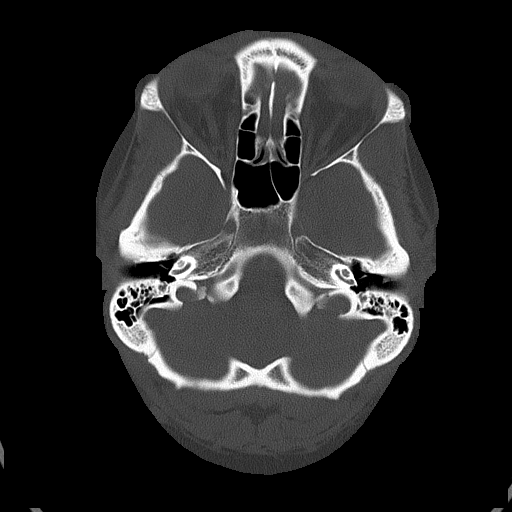
[im 15/74  bone]
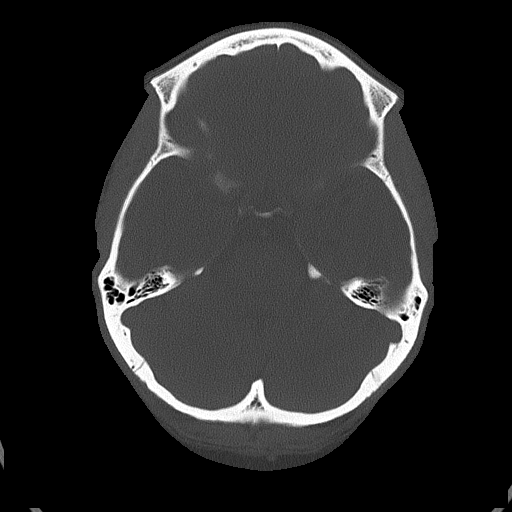
[im 22/74  bone]
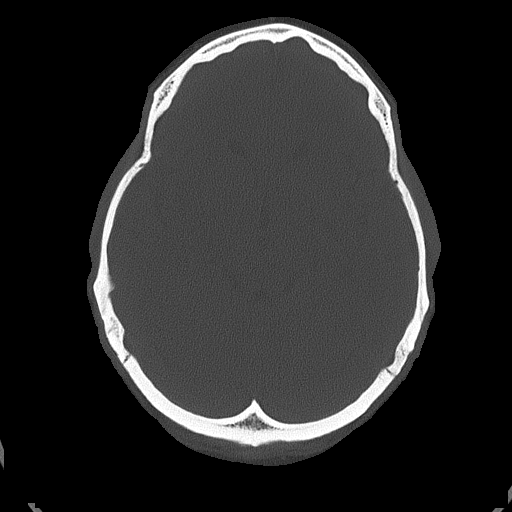
[im 33/74  bone]
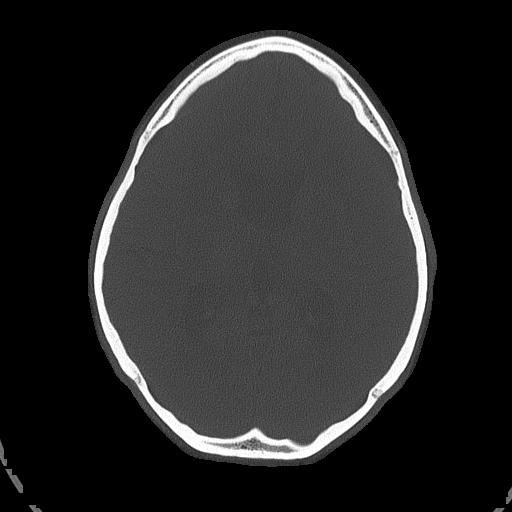

[Series 5: head without cor · coronal · non-contrast · 0.29mm/px · 3 of 66 slices shown]
[im 22/66  brain]
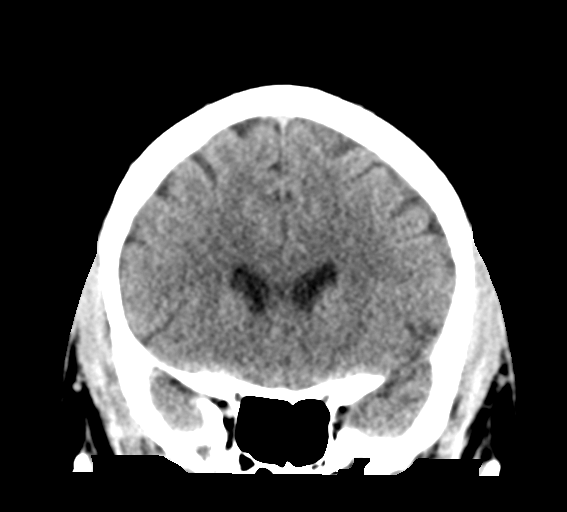
[im 29/66  brain]
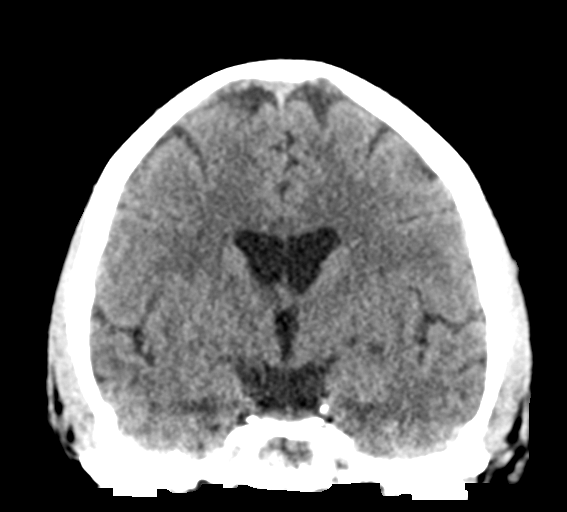
[im 37/66  brain]
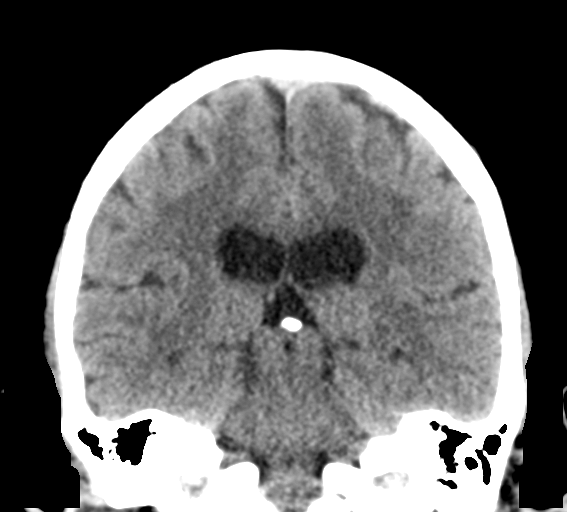

[Series 6: head without sag · sagittal · non-contrast · 0.30mm/px · 3 of 54 slices shown]
[im 18/54  brain]
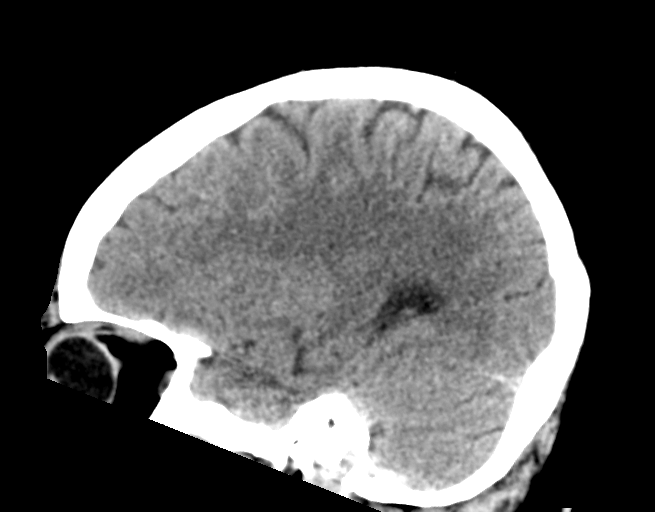
[im 27/54  brain]
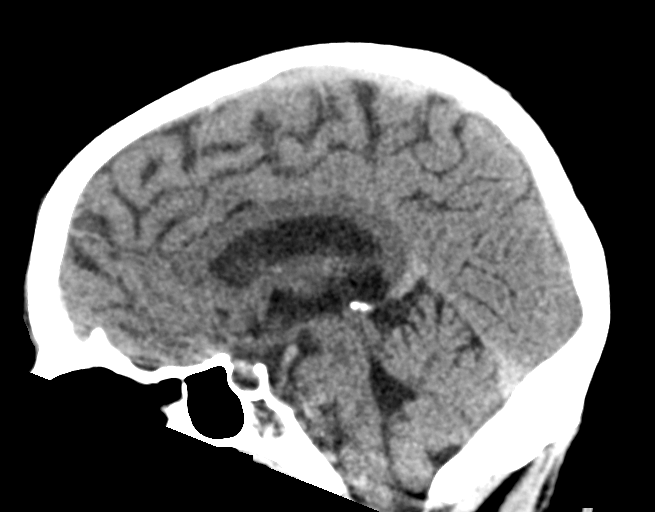
[im 36/54  brain]
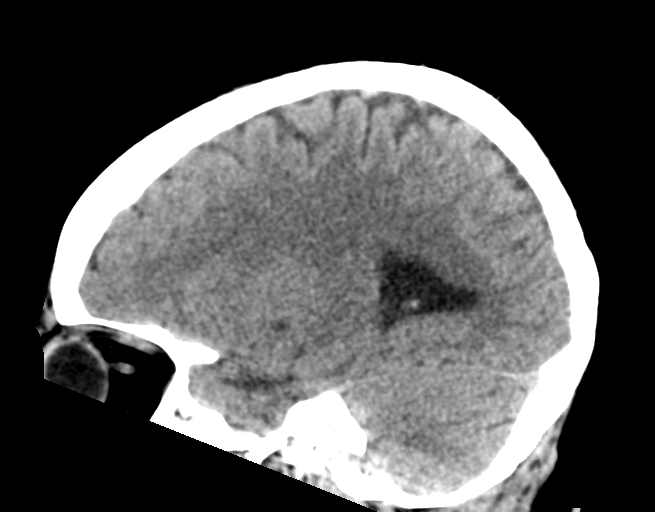

[17 of 47 positions shown; findings below may reference images not displayed]

FINDINGS: Brain: No evidence of acute infarction, hemorrhage, hydrocephalus,
extra-axial collection or mass lesion/mass effect.

Vascular: No hyperdense vessel or unexpected calcification.

Skull: Normal. Negative for fracture or focal lesion.

Sinuses/Orbits: No acute finding.

Other: None
IMPRESSION: Negative for bleed or other acute intracranial process.

## 2019-06-14 IMAGING — CR CHEST - 2 VIEW
2 series · 2 of 2 positions shown · non-contrast
Comparison: April 14, 2018

CLINICAL DATA: Chest pain

EXAM:
CHEST - 2 VIEW

[chest pa]
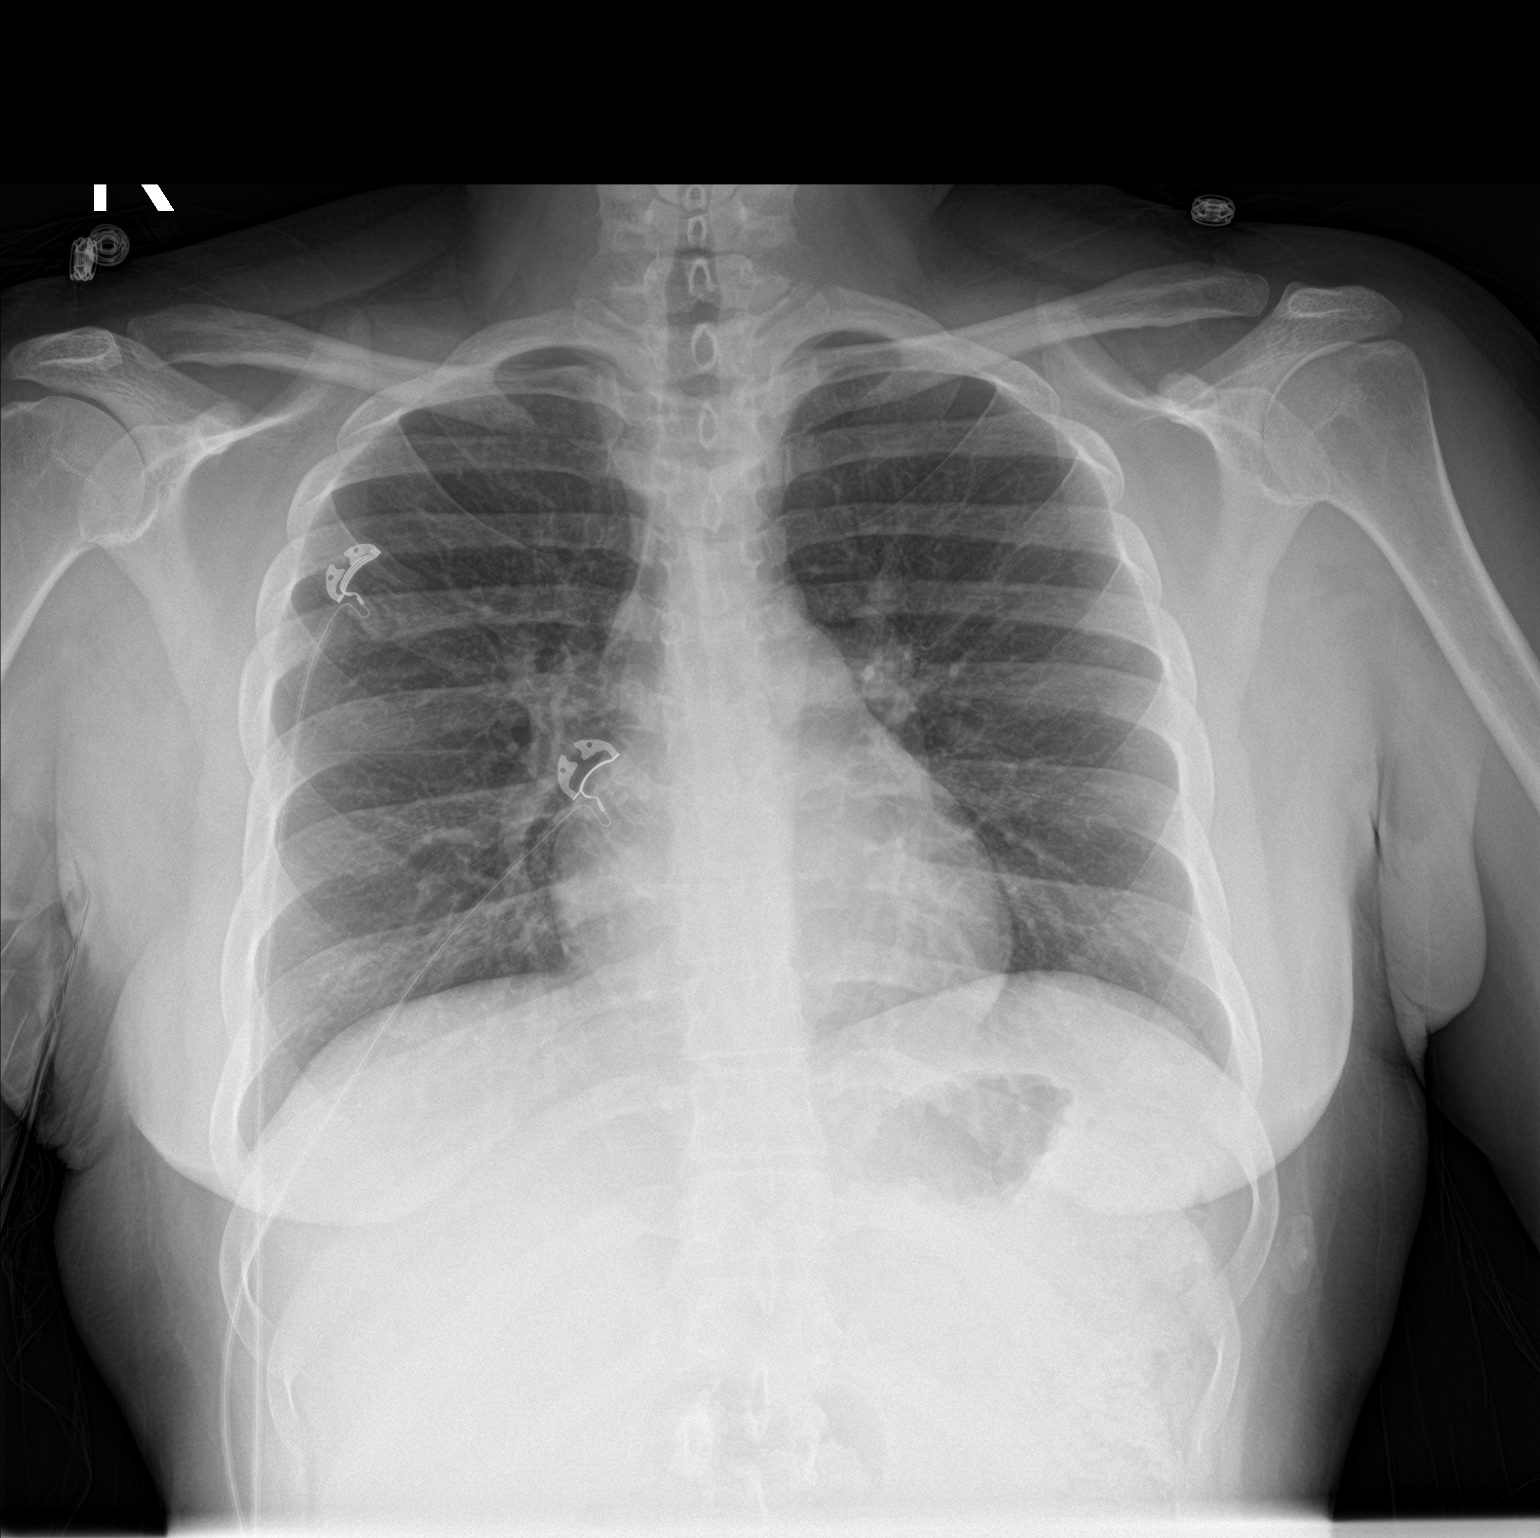

[chest lat]
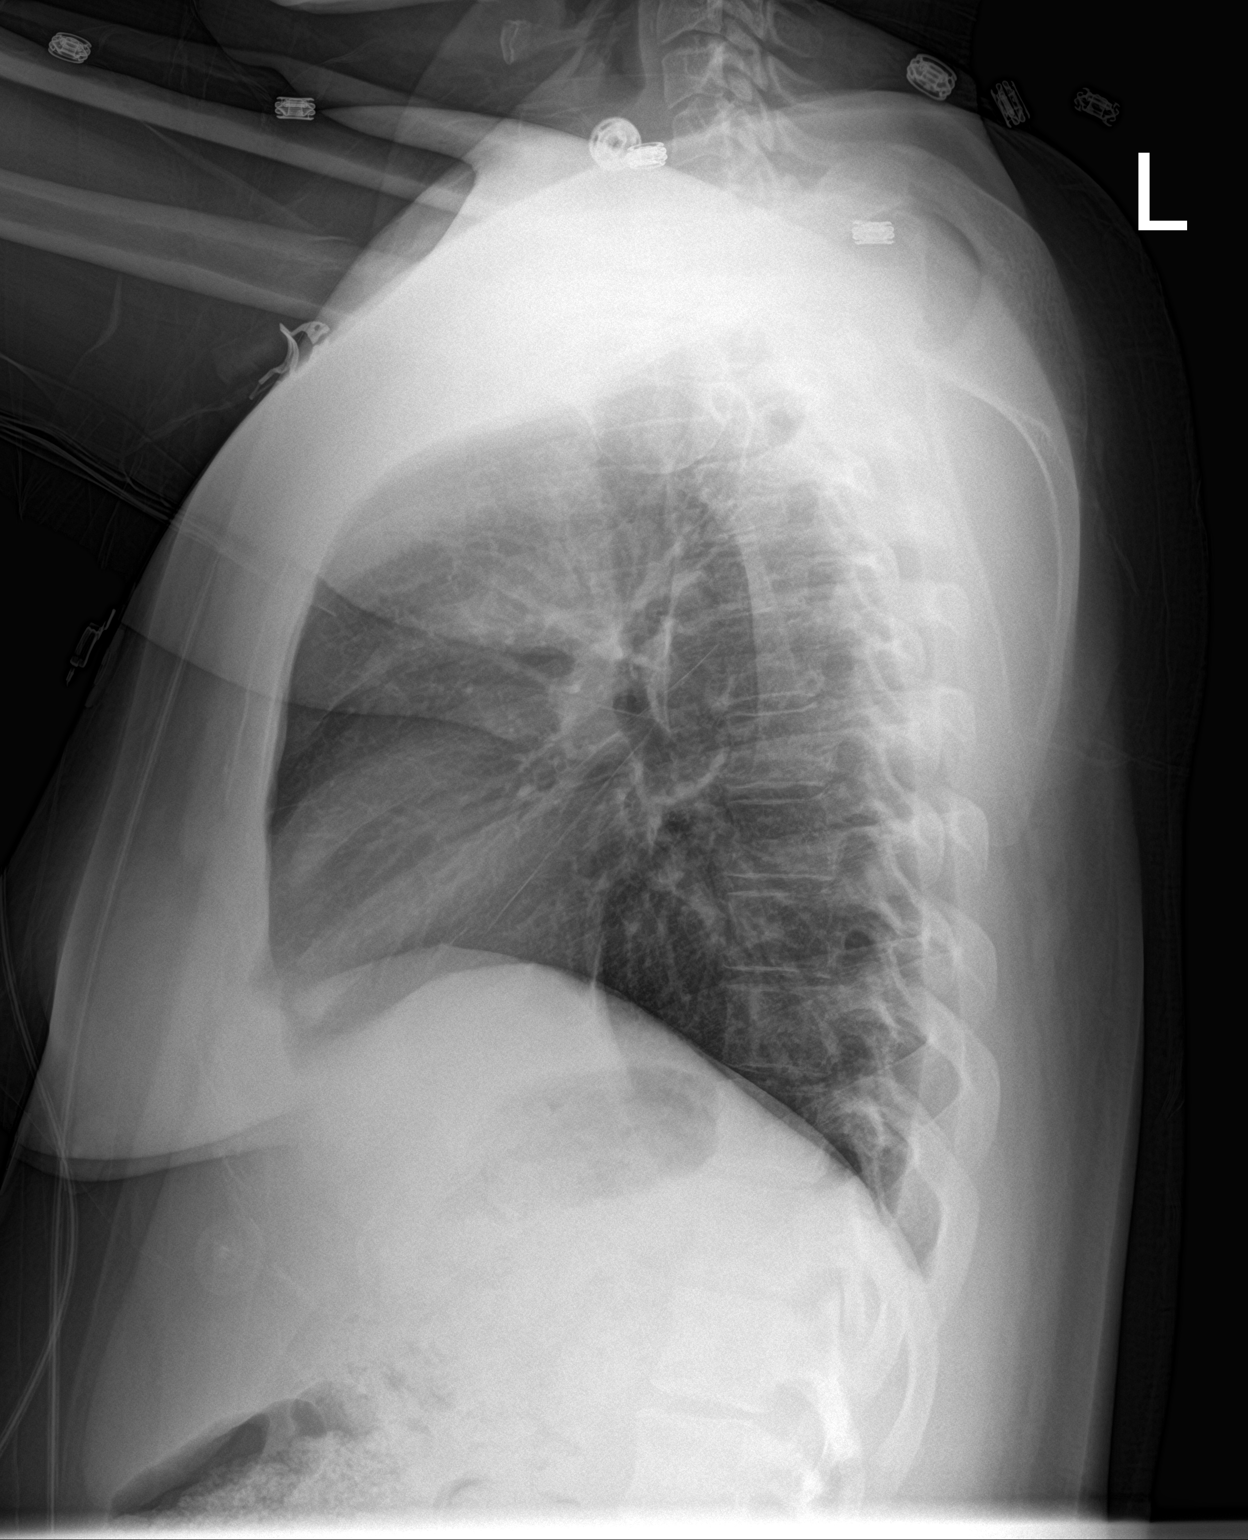

[2 of 2 positions shown; findings below may reference images not displayed]

FINDINGS: Lungs are clear. Heart size and pulmonary vascularity are normal. No
adenopathy. No pneumothorax. No bone lesions.
IMPRESSION: No edema or consolidation.

## 2019-06-20 IMAGING — DX CHEST - 2 VIEW
2 series · 2 of 2 positions shown · non-contrast
Comparison: 06/14/2018

CLINICAL DATA: Patient reports seizures today x3 with headache and
chest pain with SOB , she received 324 mg ASA po and 1 NTG sl prior
to arrival with no relief , rates chest pain [DATE] . Pt. stated she
hit her head on the floor

EXAM:
CHEST - 2 VIEW

[chest lat]
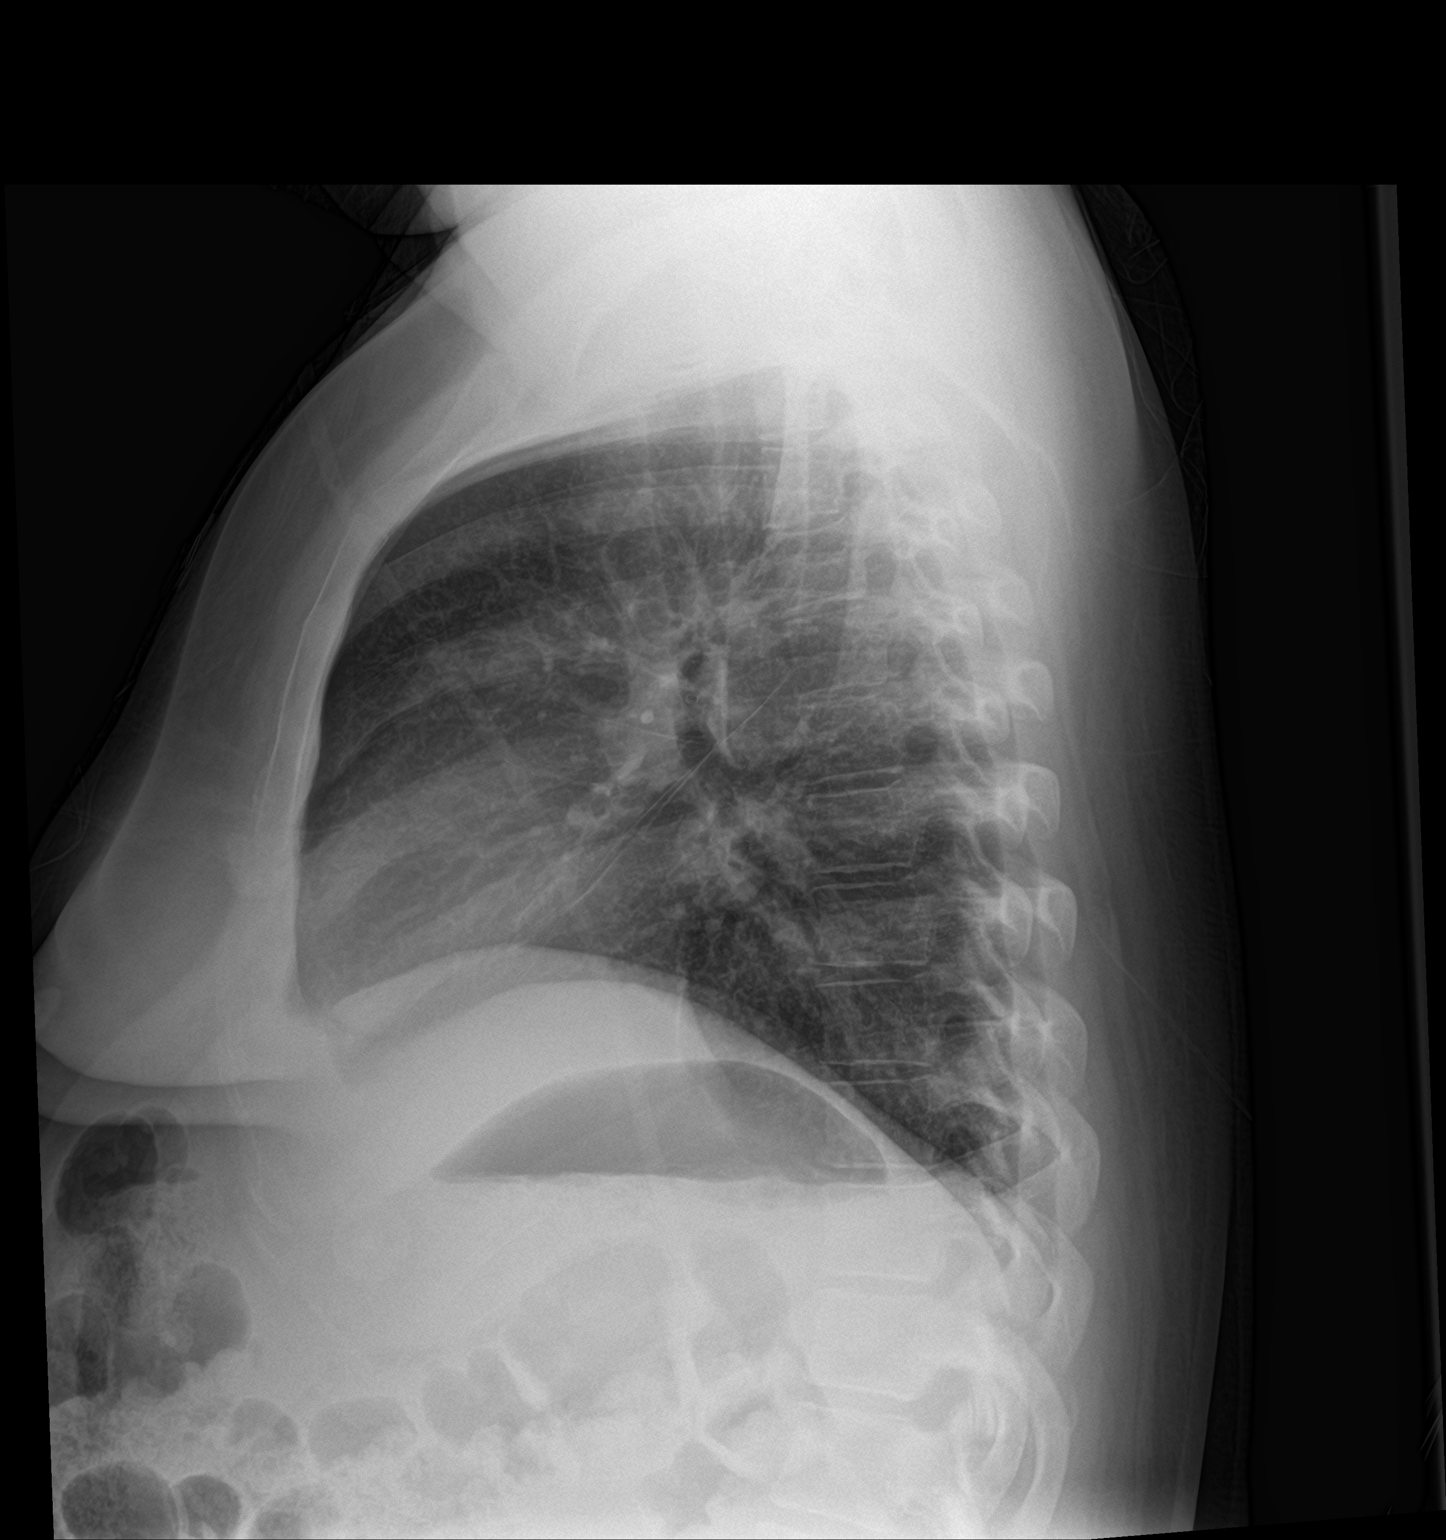

[chest ap]
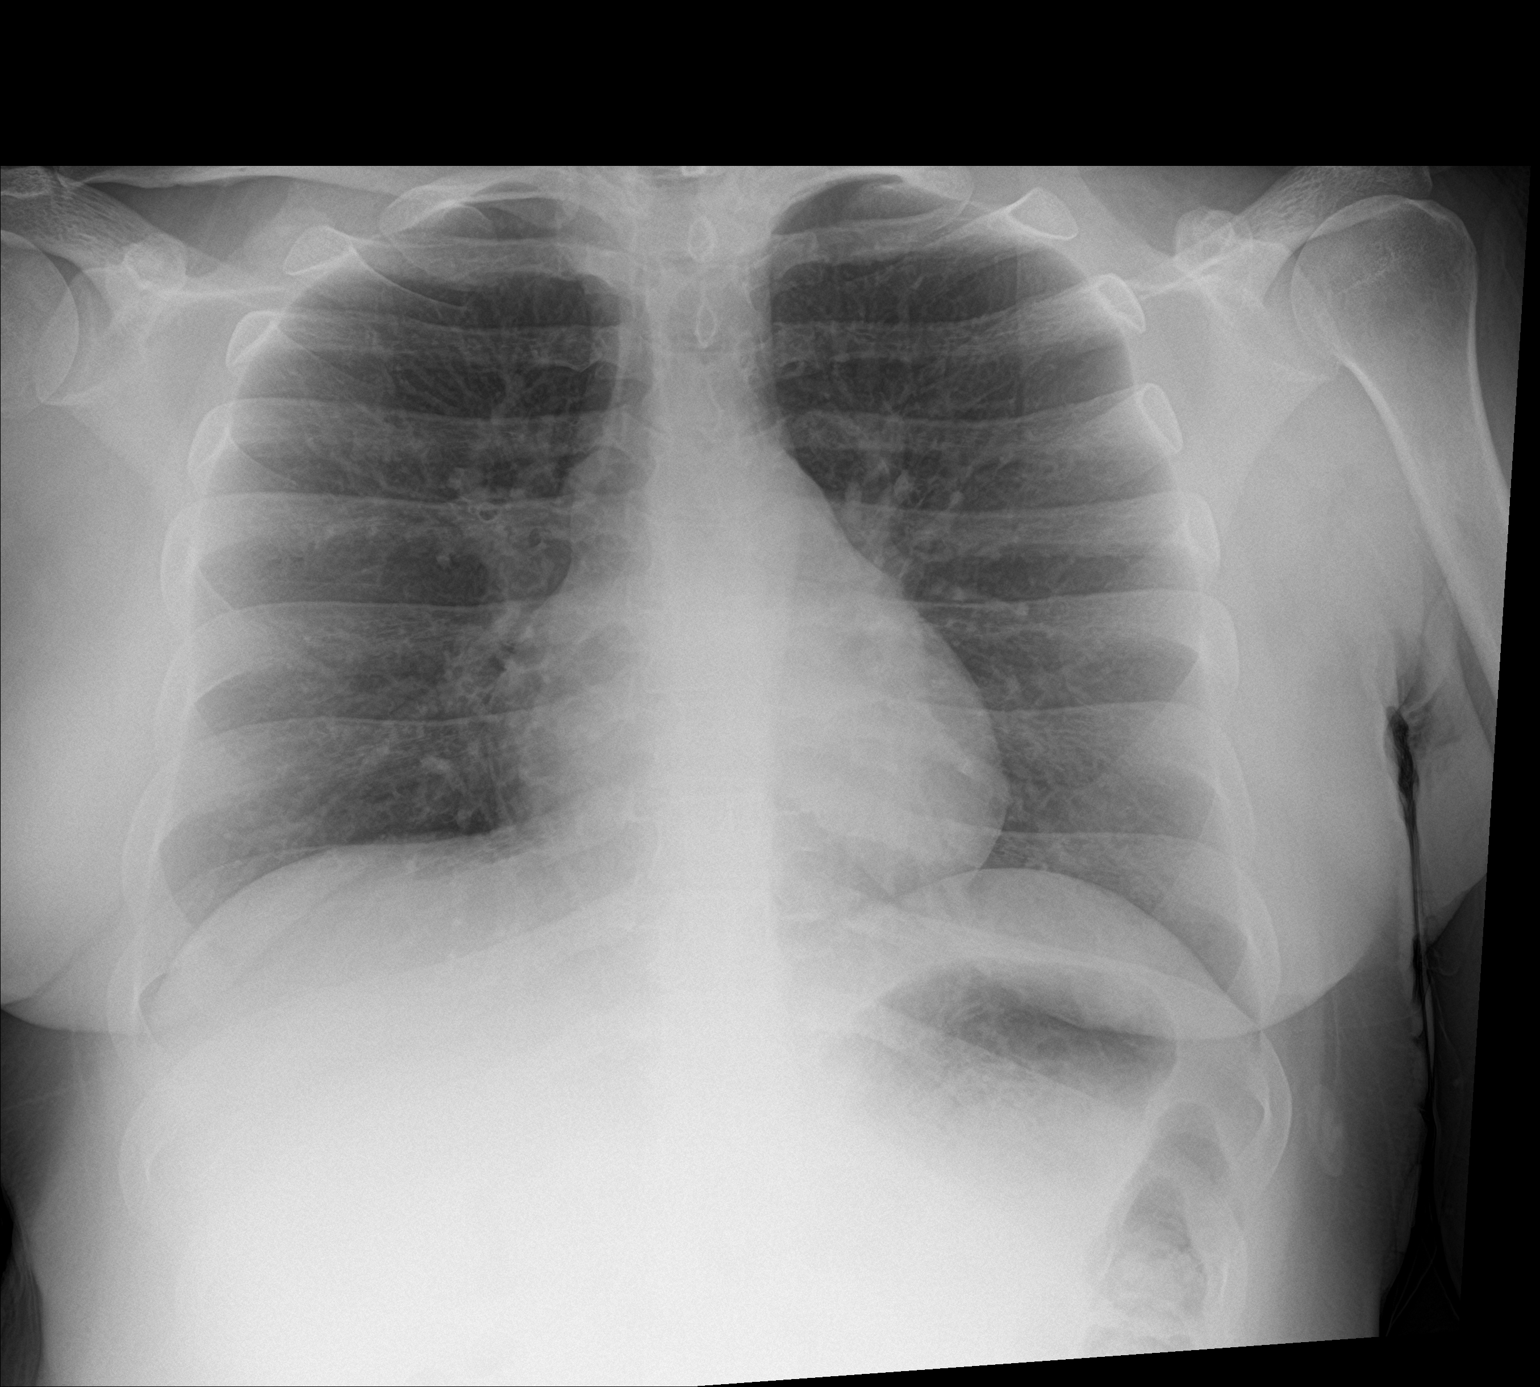

[2 of 2 positions shown; findings below may reference images not displayed]

FINDINGS: Normal heart, mediastinum and hila.

Lungs clear and symmetrically aerated.

No pleural effusion or pneumothorax.

Skeletal structures are within normal limits.
IMPRESSION: Normal chest radiographs.

## 2019-06-20 IMAGING — CT CT HEAD WITHOUT CONTRAST
4 series · 17 of 47 positions shown, 19 images · non-contrast
Comparison: 06/14/2018

CLINICAL DATA: Seizure

EXAM:
CT HEAD WITHOUT CONTRAST
TECHNIQUE: Contiguous axial images were obtained from the base of the skull
through the vertex without intravenous contrast.

[Series 3: head without · axial · non-contrast · 0.42mm/px · z∈[-50,+70]mm · 7 of 33 slices shown, 9 images]
[im 5/33  brain]
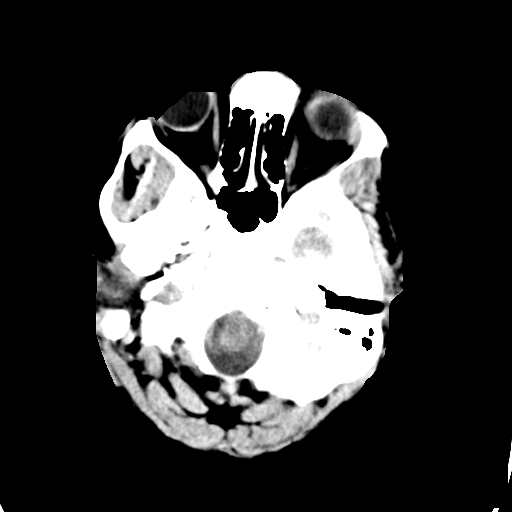
[im 5/33  bone]
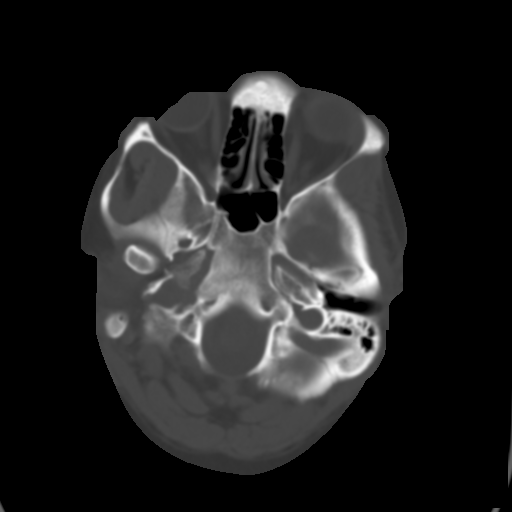
[im 9/33  brain]
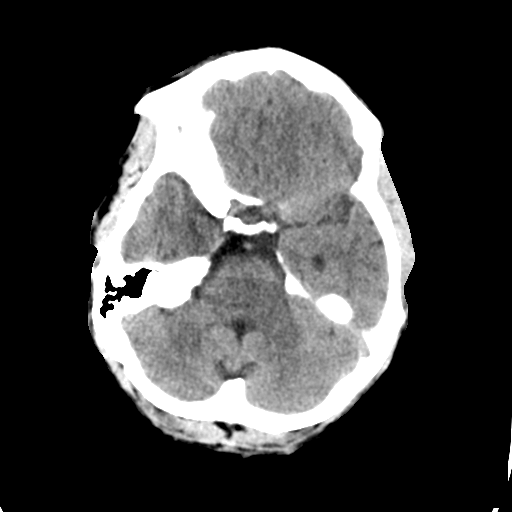
[im 13/33  brain]
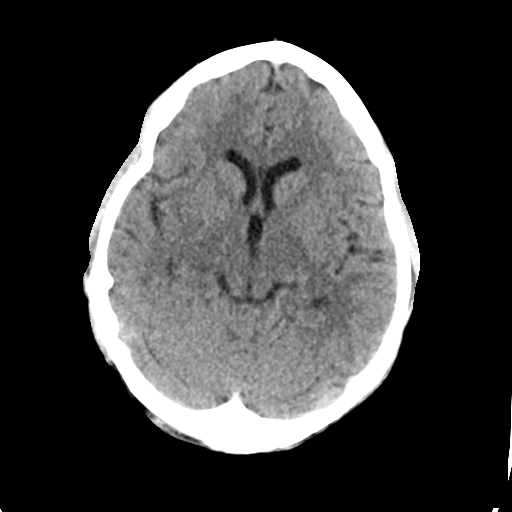
[im 17/33  brain]
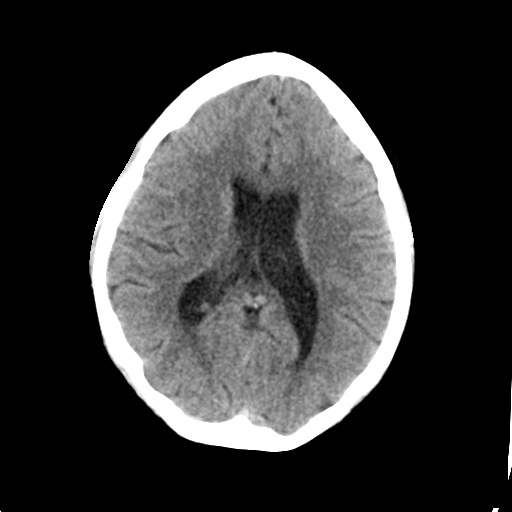
[im 21/33  brain]
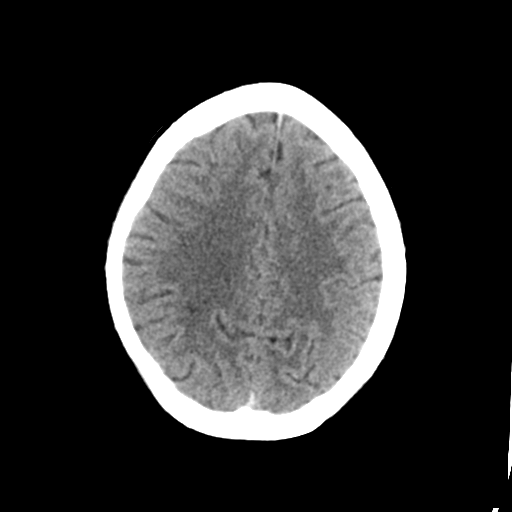
[im 21/33  bone]
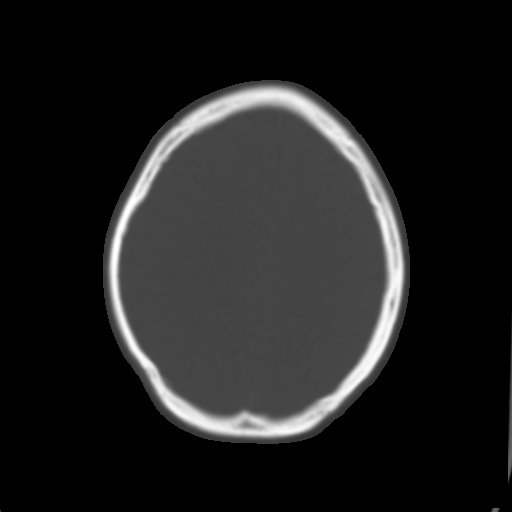
[im 25/33  brain]
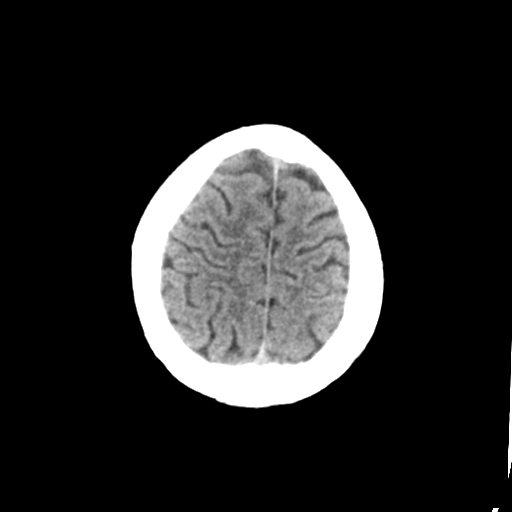
[im 29/33  brain]
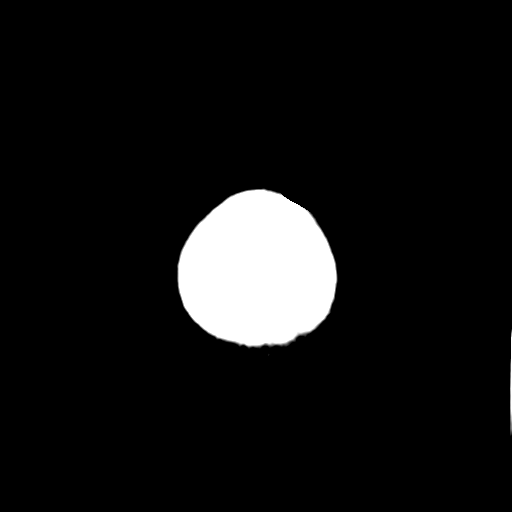

[Series 4: head bone · axial · 0.42mm/px · z∈[-54,+2]mm · 4 of 81 slices shown]
[im 9/81  bone]
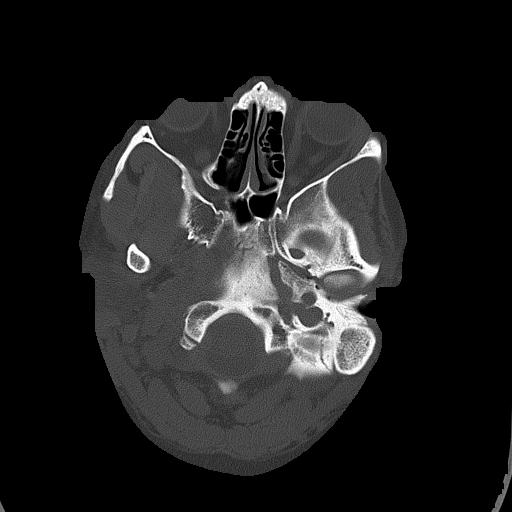
[im 17/81  bone]
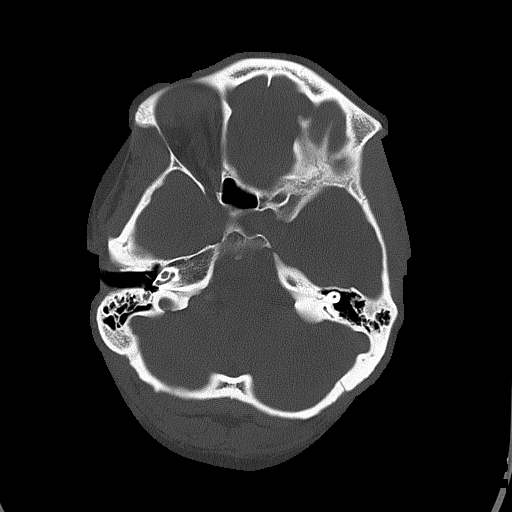
[im 25/81  bone]
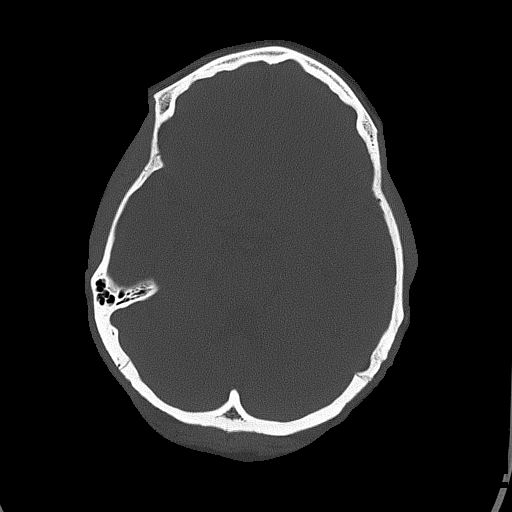
[im 37/81  bone]
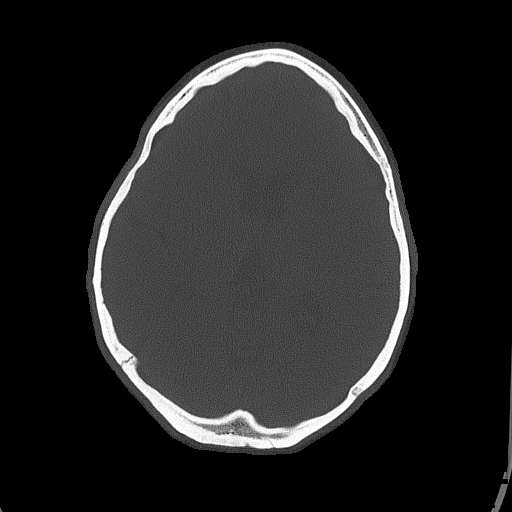

[Series 5: head without cor · coronal · non-contrast · 0.32mm/px · 3 of 67 slices shown]
[im 23/67  brain]
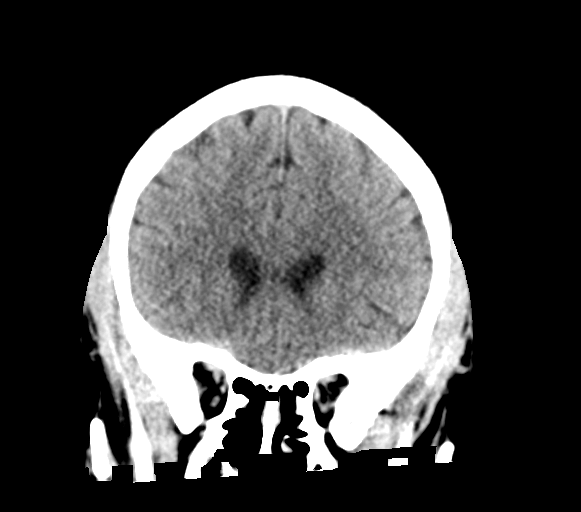
[im 30/67  brain]
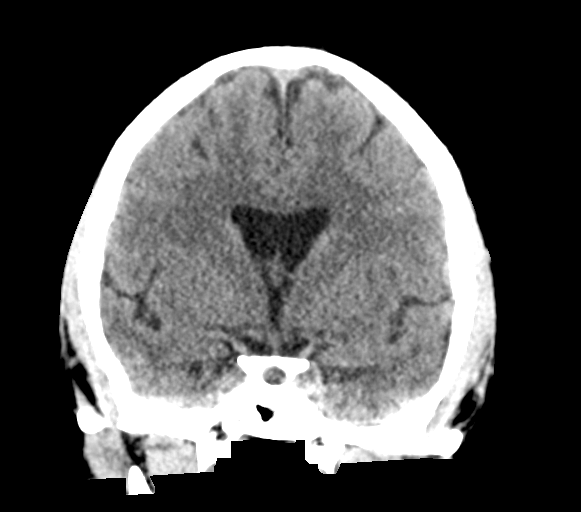
[im 37/67  brain]
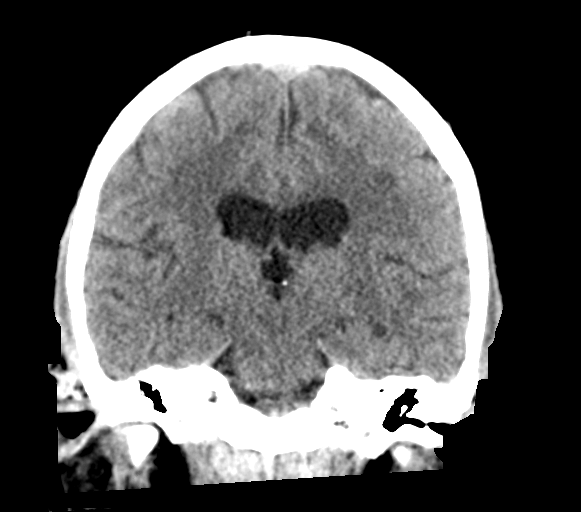

[Series 6: head without sag · sagittal · non-contrast · 0.36mm/px · 3 of 67 slices shown]
[im 23/67  brain]
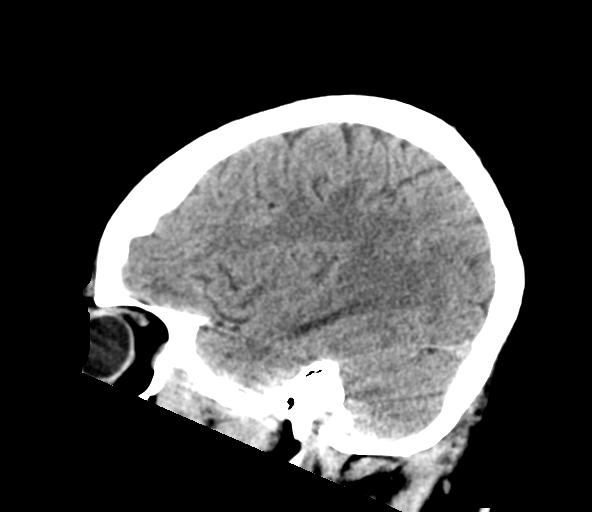
[im 34/67  brain]
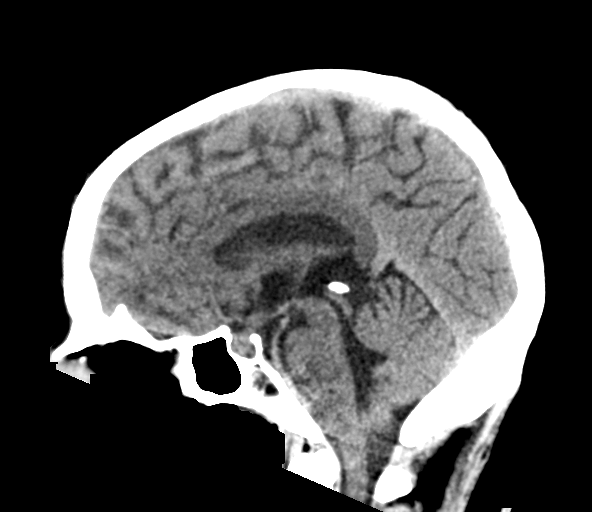
[im 45/67  brain]
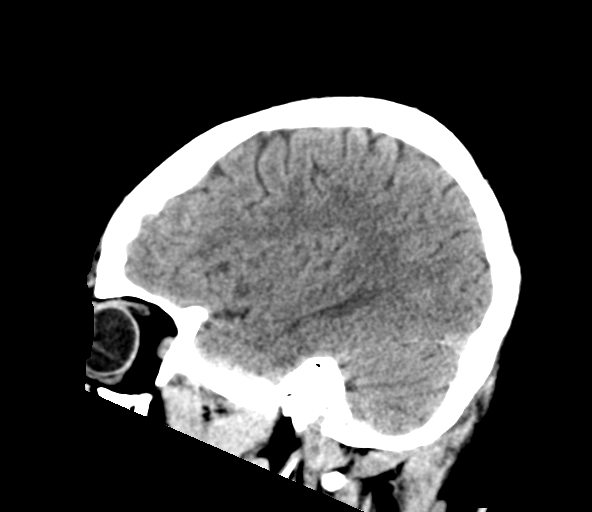

[17 of 47 positions shown; findings below may reference images not displayed]

FINDINGS: Brain: No acute intracranial abnormality. Specifically, no
hemorrhage, hydrocephalus, mass lesion, acute infarction, or
significant intracranial injury.

Vascular: No hyperdense vessel or unexpected calcification.

Skull: No acute calvarial abnormality.

Sinuses/Orbits: Visualized paranasal sinuses and mastoids clear.
Orbital soft tissues unremarkable.

Other: None
IMPRESSION: Normal study.

## 2019-06-21 IMAGING — CT CT ABDOMEN AND PELVIS WITH CONTRAST
2 of 4 series · 16 of 46 positions shown, 18 images · IV contrast (Omni 300)
Comparison: 12/24/2017

CLINICAL DATA: Acute generalized abdominal pain.

EXAM:
CT ABDOMEN AND PELVIS WITH CONTRAST
TECHNIQUE: Multidetector CT imaging of the abdomen and pelvis was performed
using the standard protocol following bolus administration of
intravenous contrast.
CONTRAST:  100mL OMNIPAQUE IOHEXOL 300 MG/ML  SOLN

[Series 3: a/p w/ 5mm · axial · 0.89mm/px · z∈[+901,+1356]mm · 13 of 99 slices shown, 15 images]
[im 4/99  soft-tissue]
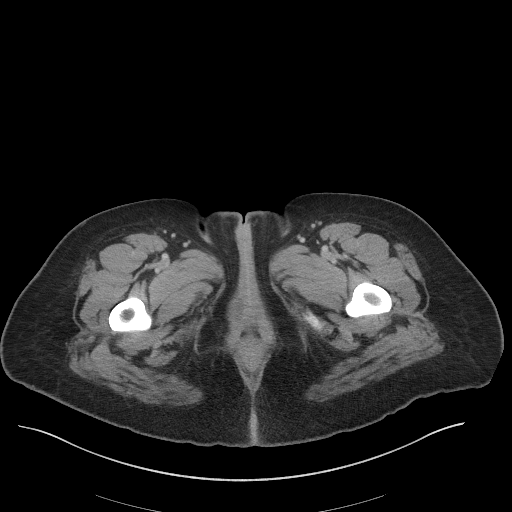
[im 4/99  bone]
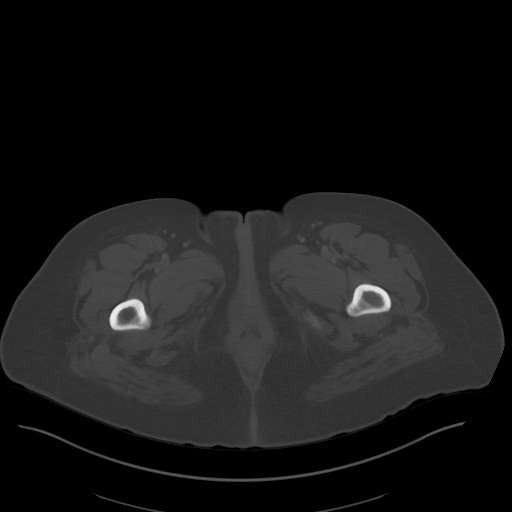
[im 12/99  soft-tissue]
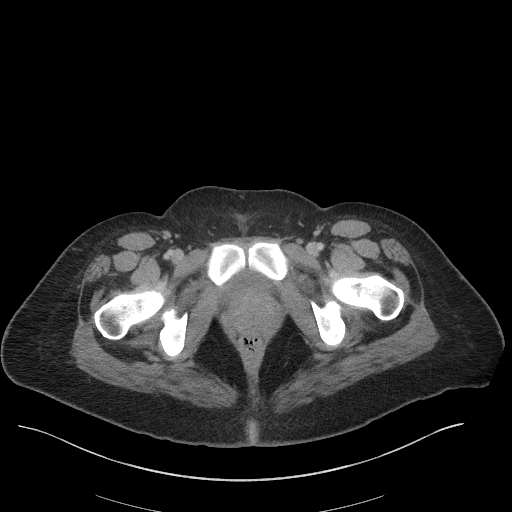
[im 20/99  soft-tissue]
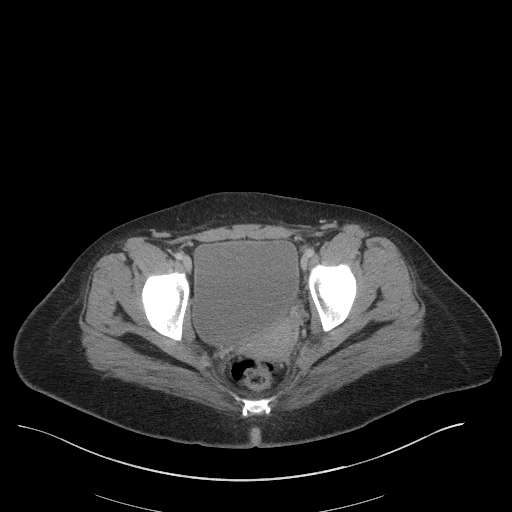
[im 28/99  soft-tissue]
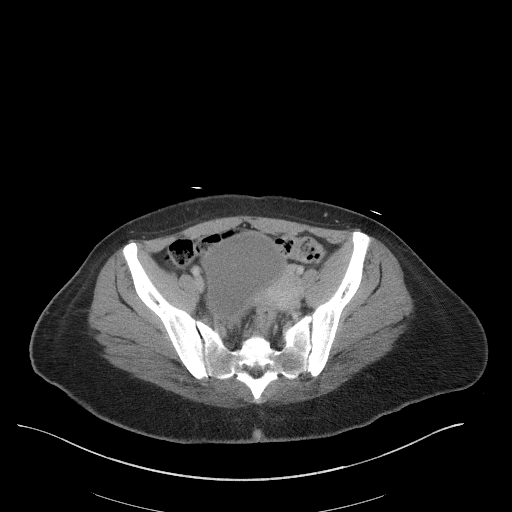
[im 36/99  soft-tissue]
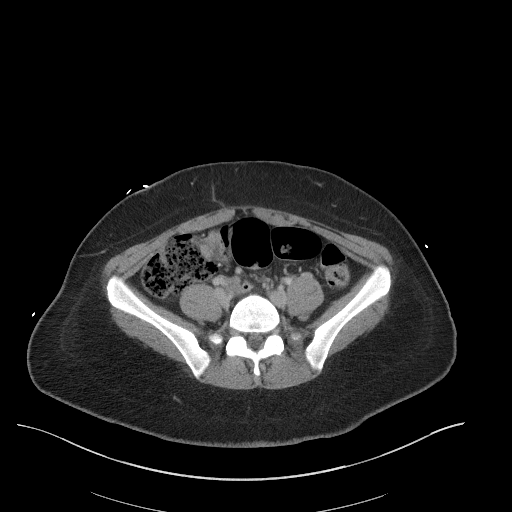
[im 44/99  soft-tissue]
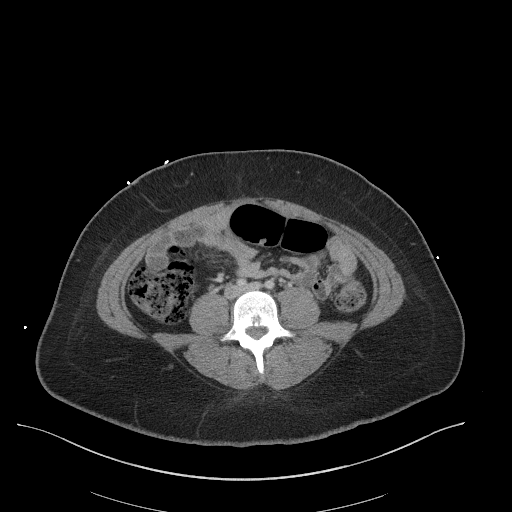
[im 51/99  soft-tissue]
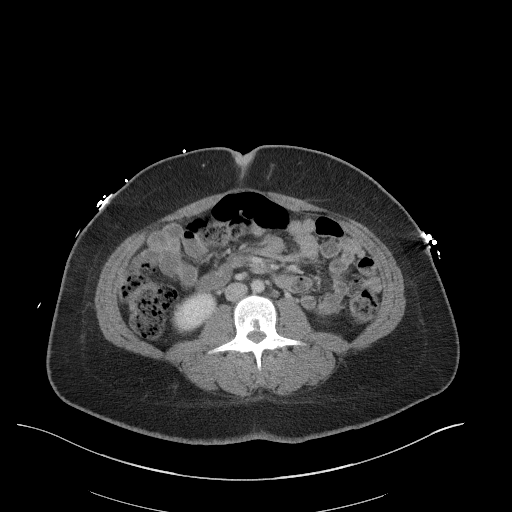
[im 55/99  soft-tissue]
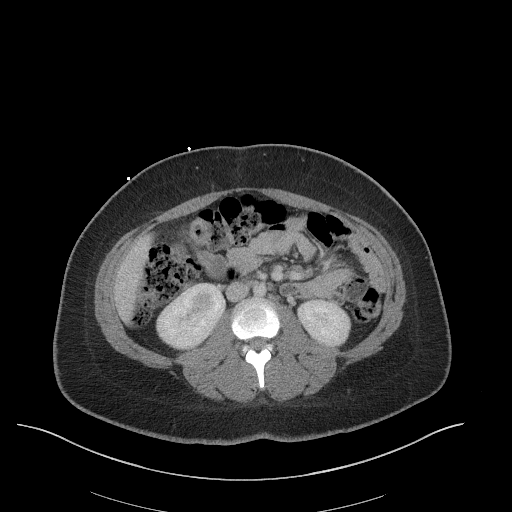
[im 63/99  soft-tissue]
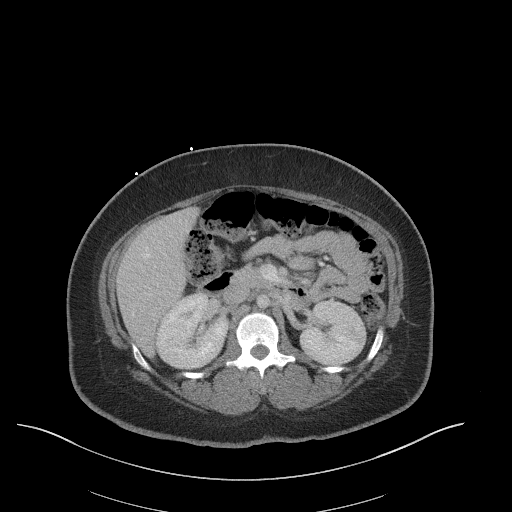
[im 63/99  bone]
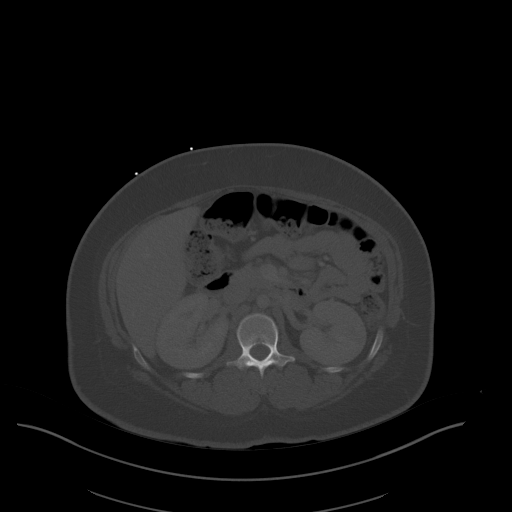
[im 71/99  soft-tissue]
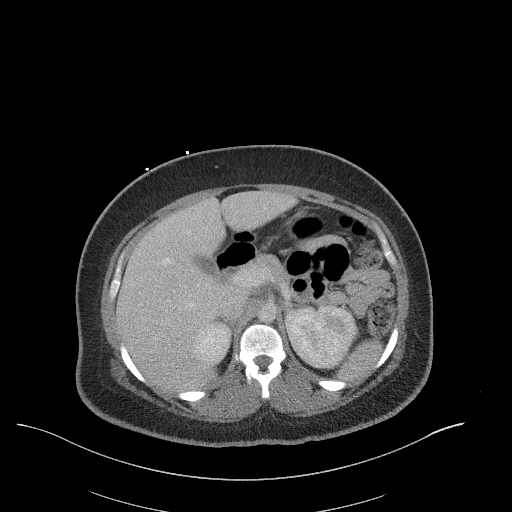
[im 79/99  soft-tissue]
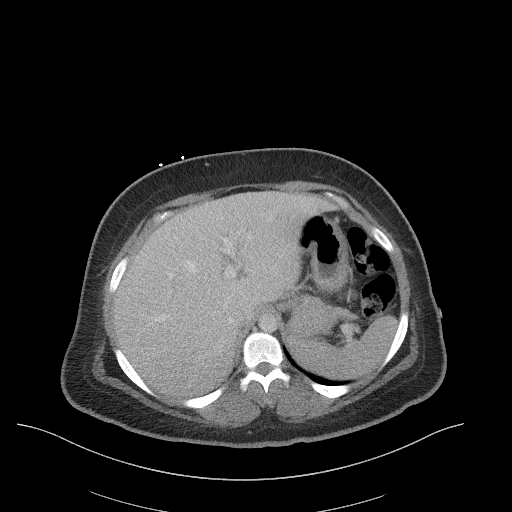
[im 87/99  soft-tissue]
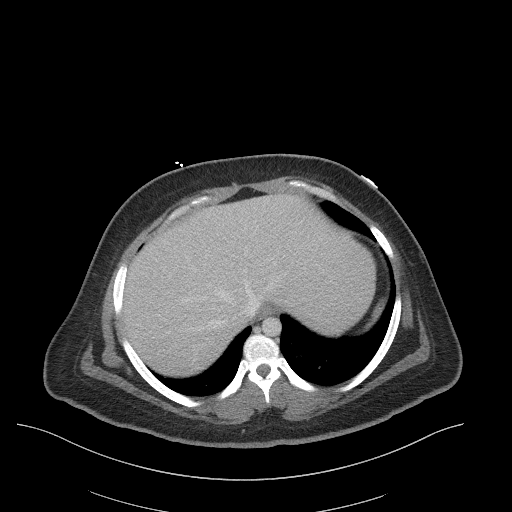
[im 95/99  soft-tissue]
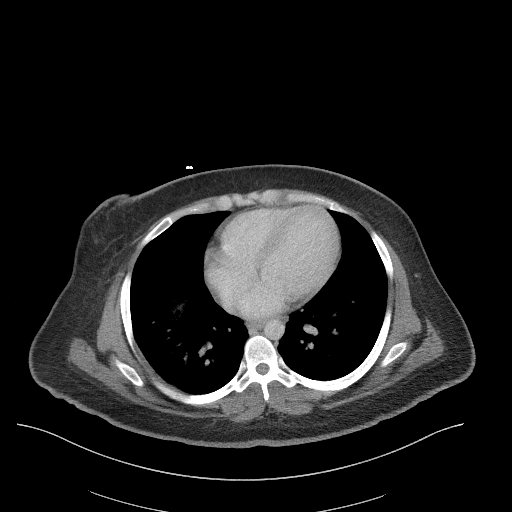

[Series 6: a/p w/ cor · coronal · 0.79mm/px · 3 of 134 slices shown]
[im 45/134  soft-tissue]
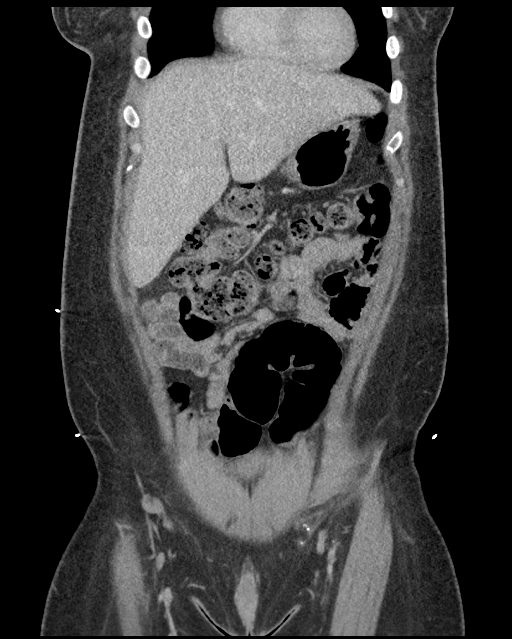
[im 60/134  soft-tissue]
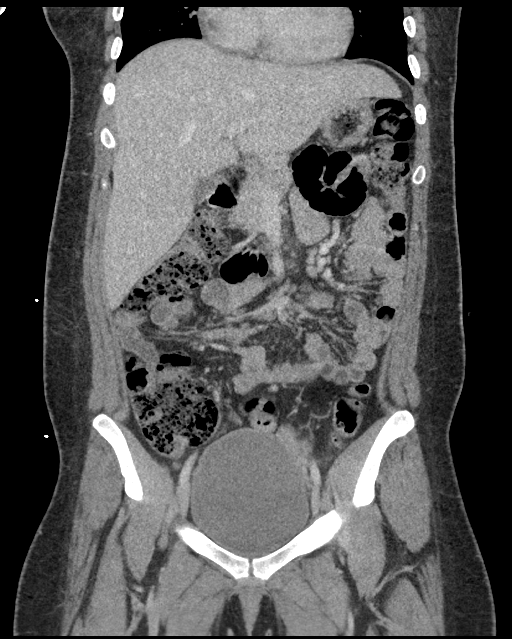
[im 74/134  soft-tissue]
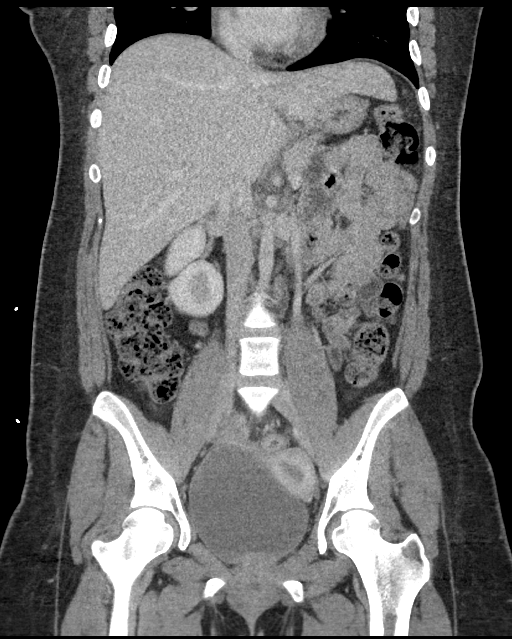

[16 of 46 positions shown; findings below may reference images not displayed]

FINDINGS: Lower chest: Mild respiratory motion artifact and atelectasis in the
lung bases. No pleural effusion.

Hepatobiliary: No focal liver abnormality is seen. No gallstones,
gallbladder wall thickening, or biliary dilatation.

Pancreas: Unremarkable.

Spleen: Unremarkable.

Adrenals/Urinary Tract: Unremarkable adrenal glands. No evidence of
renal mass, calculi, or hydronephrosis. Unremarkable bladder.

Stomach/Bowel: The stomach is unremarkable. There is no evidence of
bowel obstruction or inflammation. The appendix is unremarkable.

Vascular/Lymphatic: No significant vascular findings are present. No
enlarged abdominal or pelvic lymph nodes.

Reproductive: Grossly unremarkable uterus and ovaries. 2.3 cm
hypodensity along the right aspect of the vagina, unchanged and
likely a Bartholin cyst.

Other: No intraperitoneal free fluid. No abdominal wall hernia.

Musculoskeletal: No acute osseous abnormality or suspicious osseous
lesion.
IMPRESSION: No acute abnormality identified in the abdomen or pelvis.

## 2019-07-05 IMAGING — CT CT ABDOMEN AND PELVIS WITH CONTRAST
2 of 4 series · 15 of 46 positions shown, 17 images · IV contrast (Omni 300)
Comparison: June 21, 2018

CLINICAL DATA: Abdominal pain and nausea x2 weeks

EXAM:
CT ABDOMEN AND PELVIS WITH CONTRAST
TECHNIQUE: Multidetector CT imaging of the abdomen and pelvis was performed
using the standard protocol following bolus administration of
intravenous contrast.
CONTRAST:  100mL OMNIPAQUE IOHEXOL 300 MG/ML  SOLN

[Series 3: a/p w/ 5mm · axial · 0.88mm/px · z∈[-441,-26]mm · 12 of 93 slices shown, 14 images]
[im 5/93  soft-tissue]
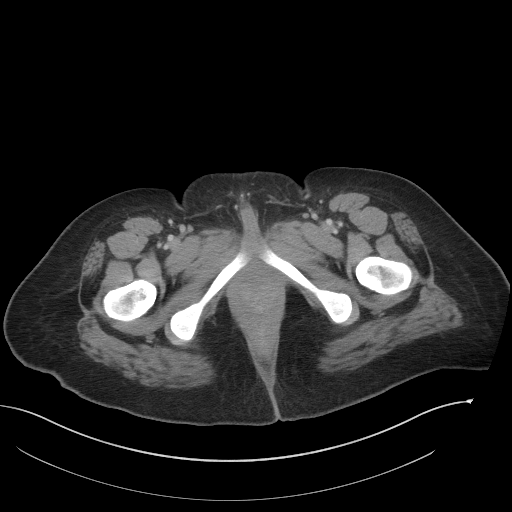
[im 5/93  bone]
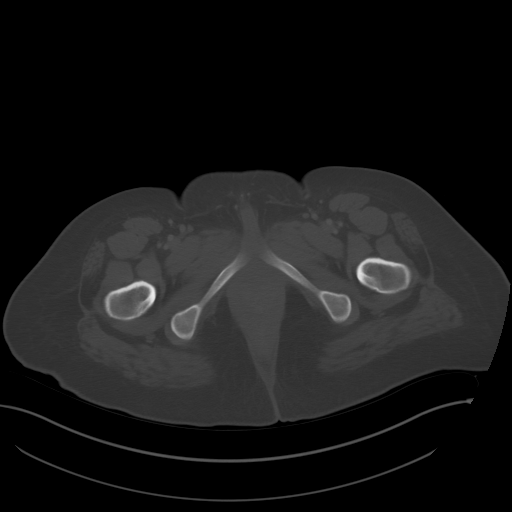
[im 14/93  soft-tissue]
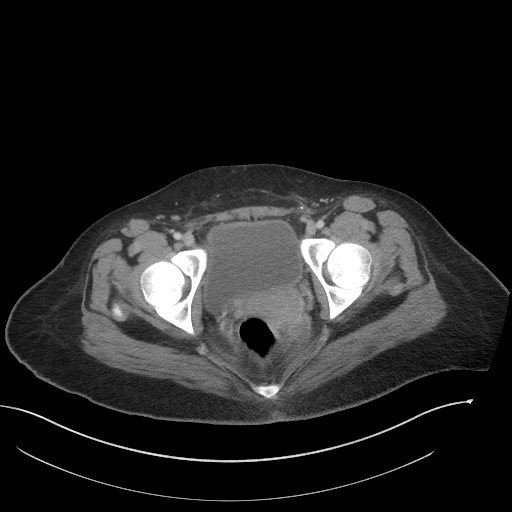
[im 22/93  soft-tissue]
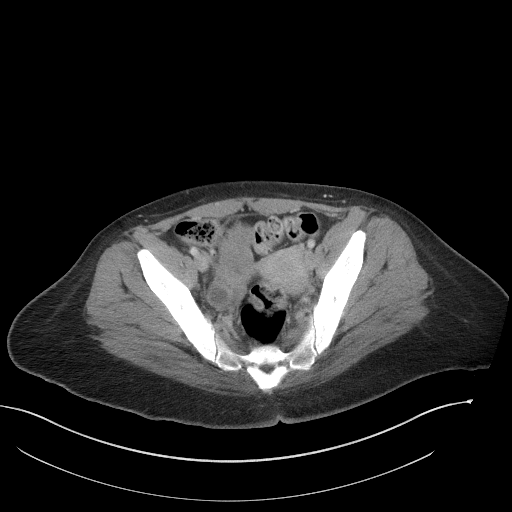
[im 27/93  soft-tissue]
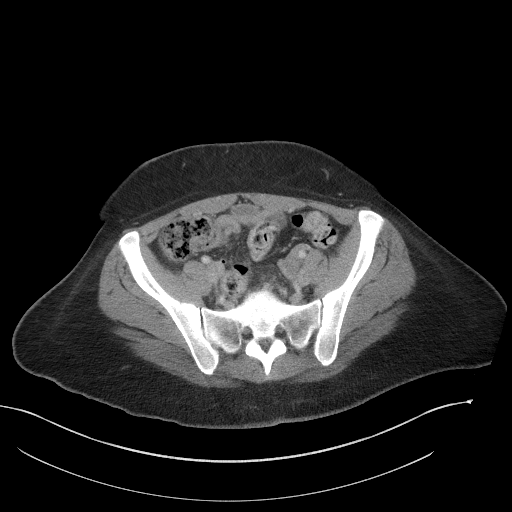
[im 36/93  soft-tissue]
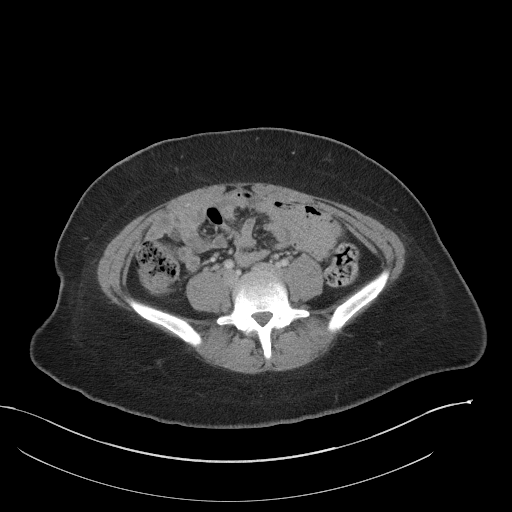
[im 44/93  soft-tissue]
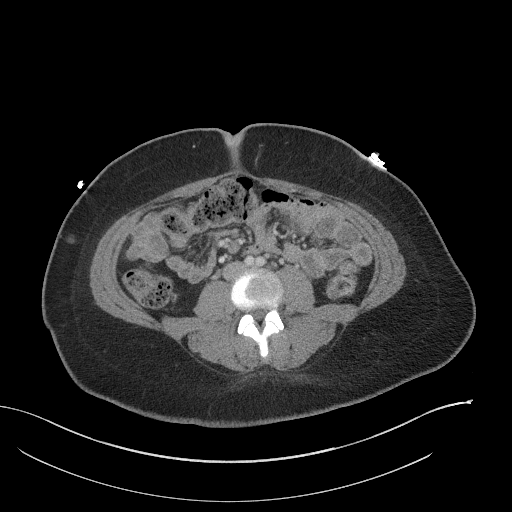
[im 49/93  soft-tissue]
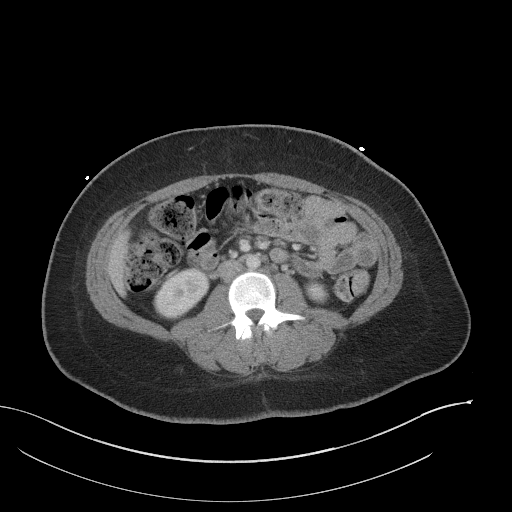
[im 57/93  soft-tissue]
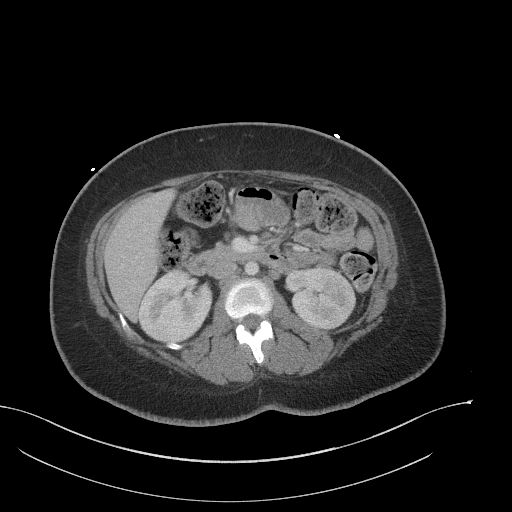
[im 66/93  soft-tissue]
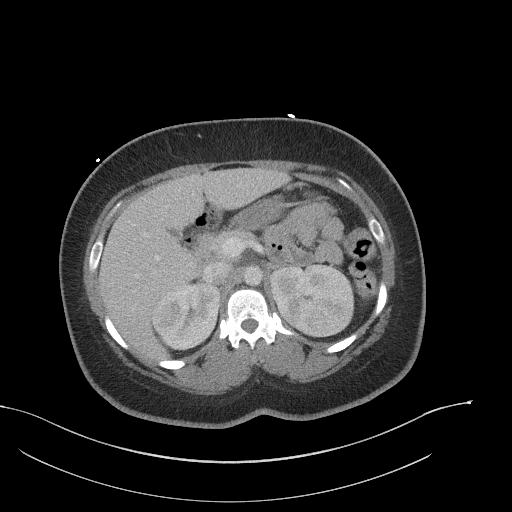
[im 66/93  bone]
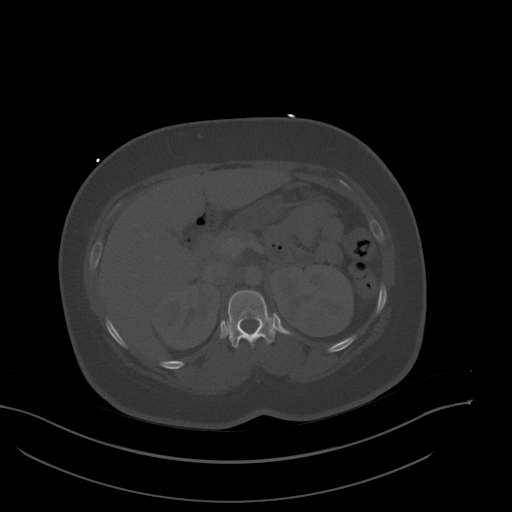
[im 71/93  soft-tissue]
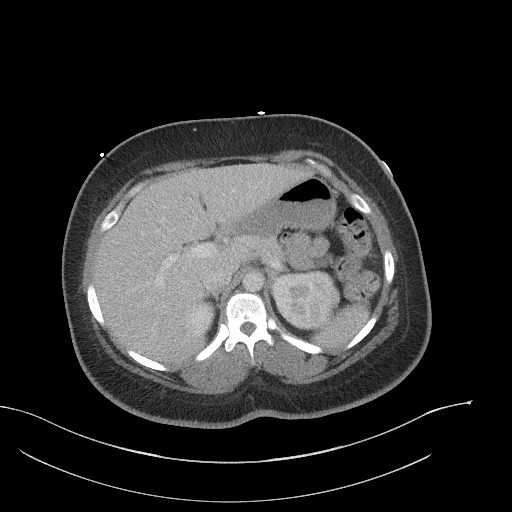
[im 79/93  soft-tissue]
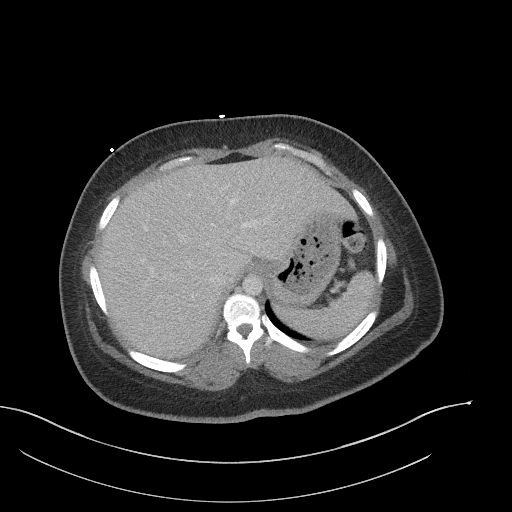
[im 88/93  soft-tissue]
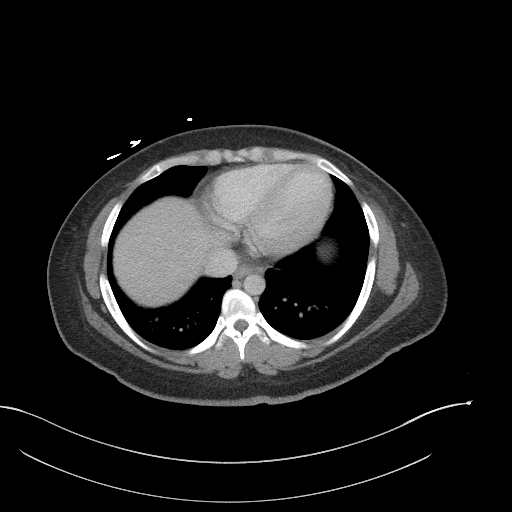

[Series 6: a/p w/ cor · coronal · 0.66mm/px · 3 of 126 slices shown]
[im 42/126  soft-tissue]
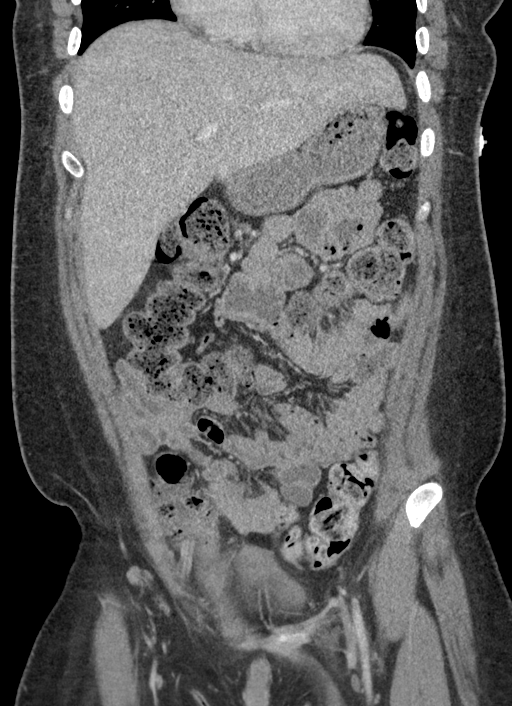
[im 56/126  soft-tissue]
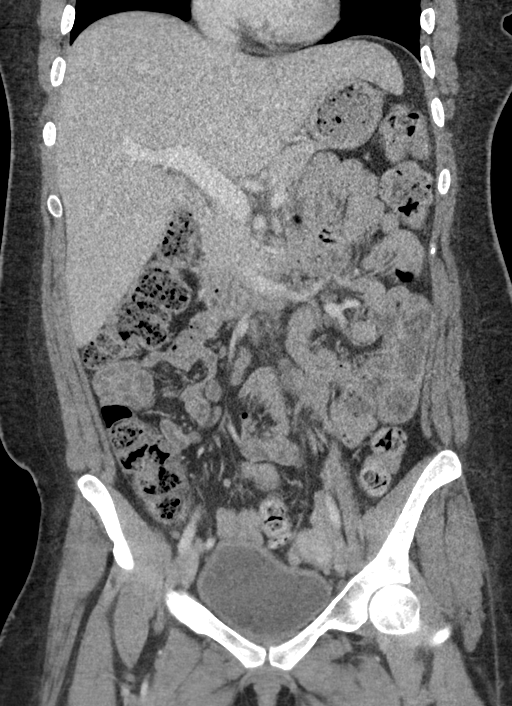
[im 70/126  soft-tissue]
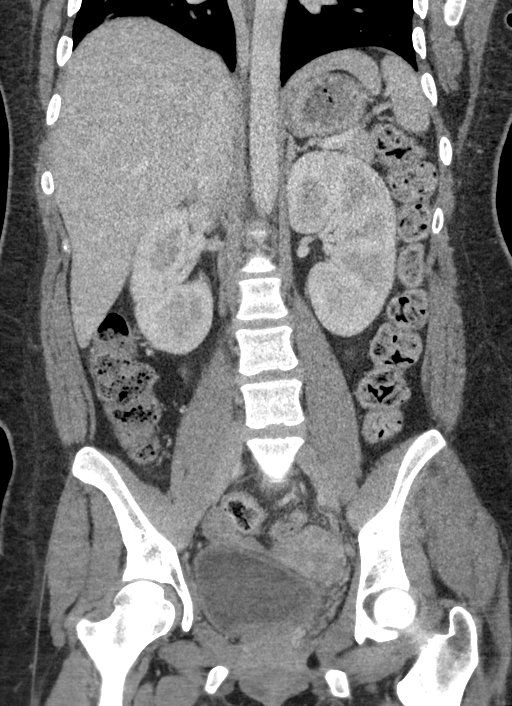

[15 of 46 positions shown; findings below may reference images not displayed]

FINDINGS: Lower chest: The lung bases are clear. The heart size is normal.

Hepatobiliary: The liver is enlarged measuring approximately 22 cm
craniocaudad. Normal gallbladder.There is no biliary ductal
dilation.

Pancreas: Normal contours without ductal dilatation. No
peripancreatic fluid collection.

Spleen: No splenic laceration or hematoma.

Adrenals/Urinary Tract:

--Adrenal glands: No adrenal hemorrhage.

--Right kidney/ureter: No hydronephrosis or perinephric hematoma.

--Left kidney/ureter: No hydronephrosis or perinephric hematoma.

--Urinary bladder: There is mild diffuse bladder wall thickening.

Stomach/Bowel:

--Stomach/Duodenum: No hiatal hernia or other gastric abnormality.
Normal duodenal course and caliber.

--Small bowel: No dilatation or inflammation.

--Colon: There is a moderate amount of stool throughout the colon.
No CT evidence for diverticulitis or colitis.

--Appendix: Normal.

Vascular/Lymphatic: Normal course and caliber of the major abdominal
vessels.

--No retroperitoneal lymphadenopathy.

--No mesenteric lymphadenopathy.

--No pelvic or inguinal lymphadenopathy.

Reproductive: Unremarkable

Other: No ascites or free air. The abdominal wall is normal.

Musculoskeletal. No acute displaced fractures.
IMPRESSION: 1. Mild bladder wall thickening of unknown clinical significance.
Correlation with urinalysis is recommended.
2. Otherwise, no acute intra-abdominal abnormality detected.
3. Moderate amount of stool throughout the colon.

## 2019-07-05 IMAGING — DX PORTABLE CHEST - 1 VIEW
1 series · 1 of 1 positions shown · non-contrast
Comparison: June 20, 2018

CLINICAL DATA: Abdominal pain.

EXAM:
PORTABLE CHEST 1 VIEW

[chest ap]
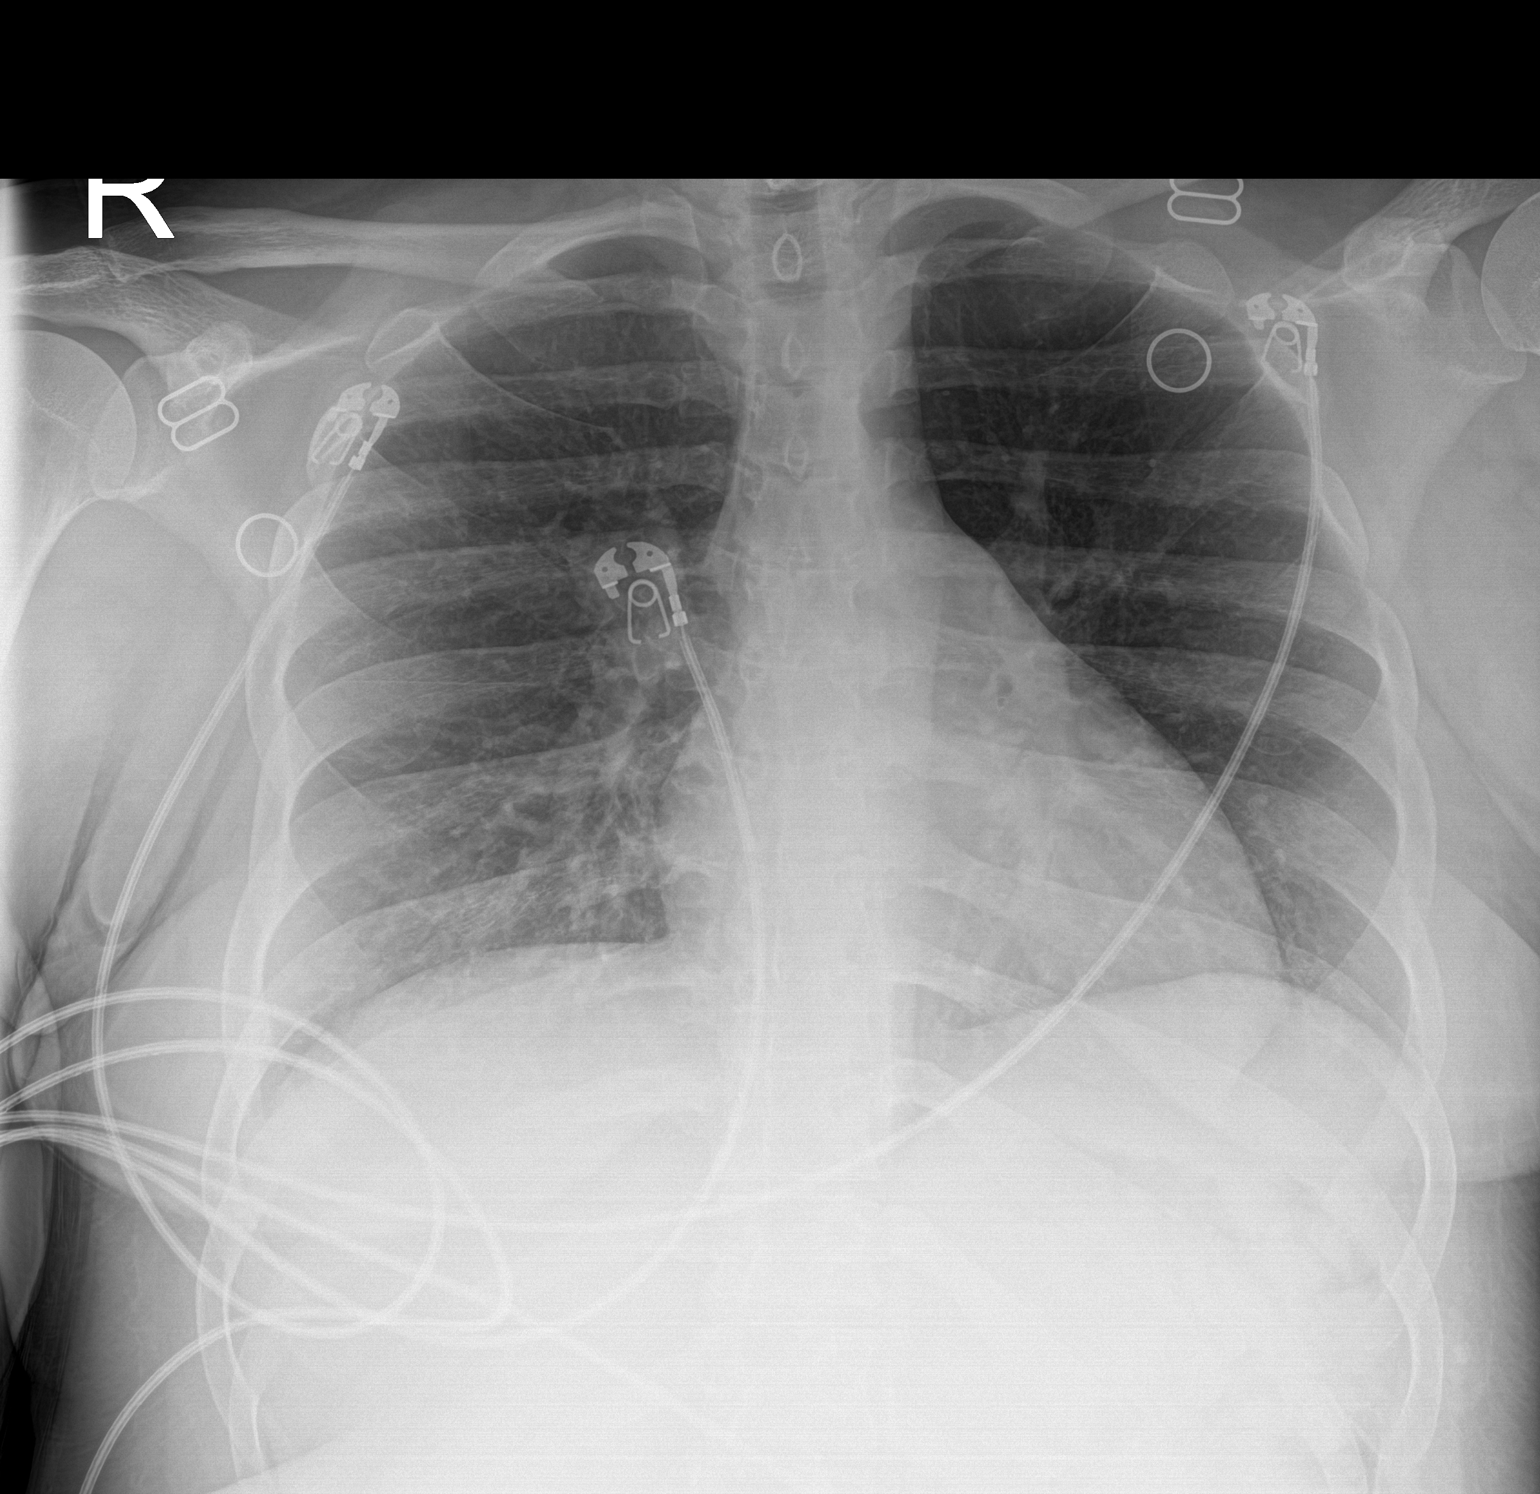

[1 of 1 positions shown; findings below may reference images not displayed]

FINDINGS: The cardiac silhouette is stable. There is no pneumothorax. No large
pleural effusion. No infiltrate. No acute osseous abnormality.
IMPRESSION: No active disease.

## 2019-07-25 IMAGING — DX PORTABLE CHEST - 1 VIEW
1 series · 1 of 1 positions shown · non-contrast
Comparison: 07/05/2018

CLINICAL DATA: Pain. Emesis.

EXAM:
PORTABLE CHEST 1 VIEW

[chest]
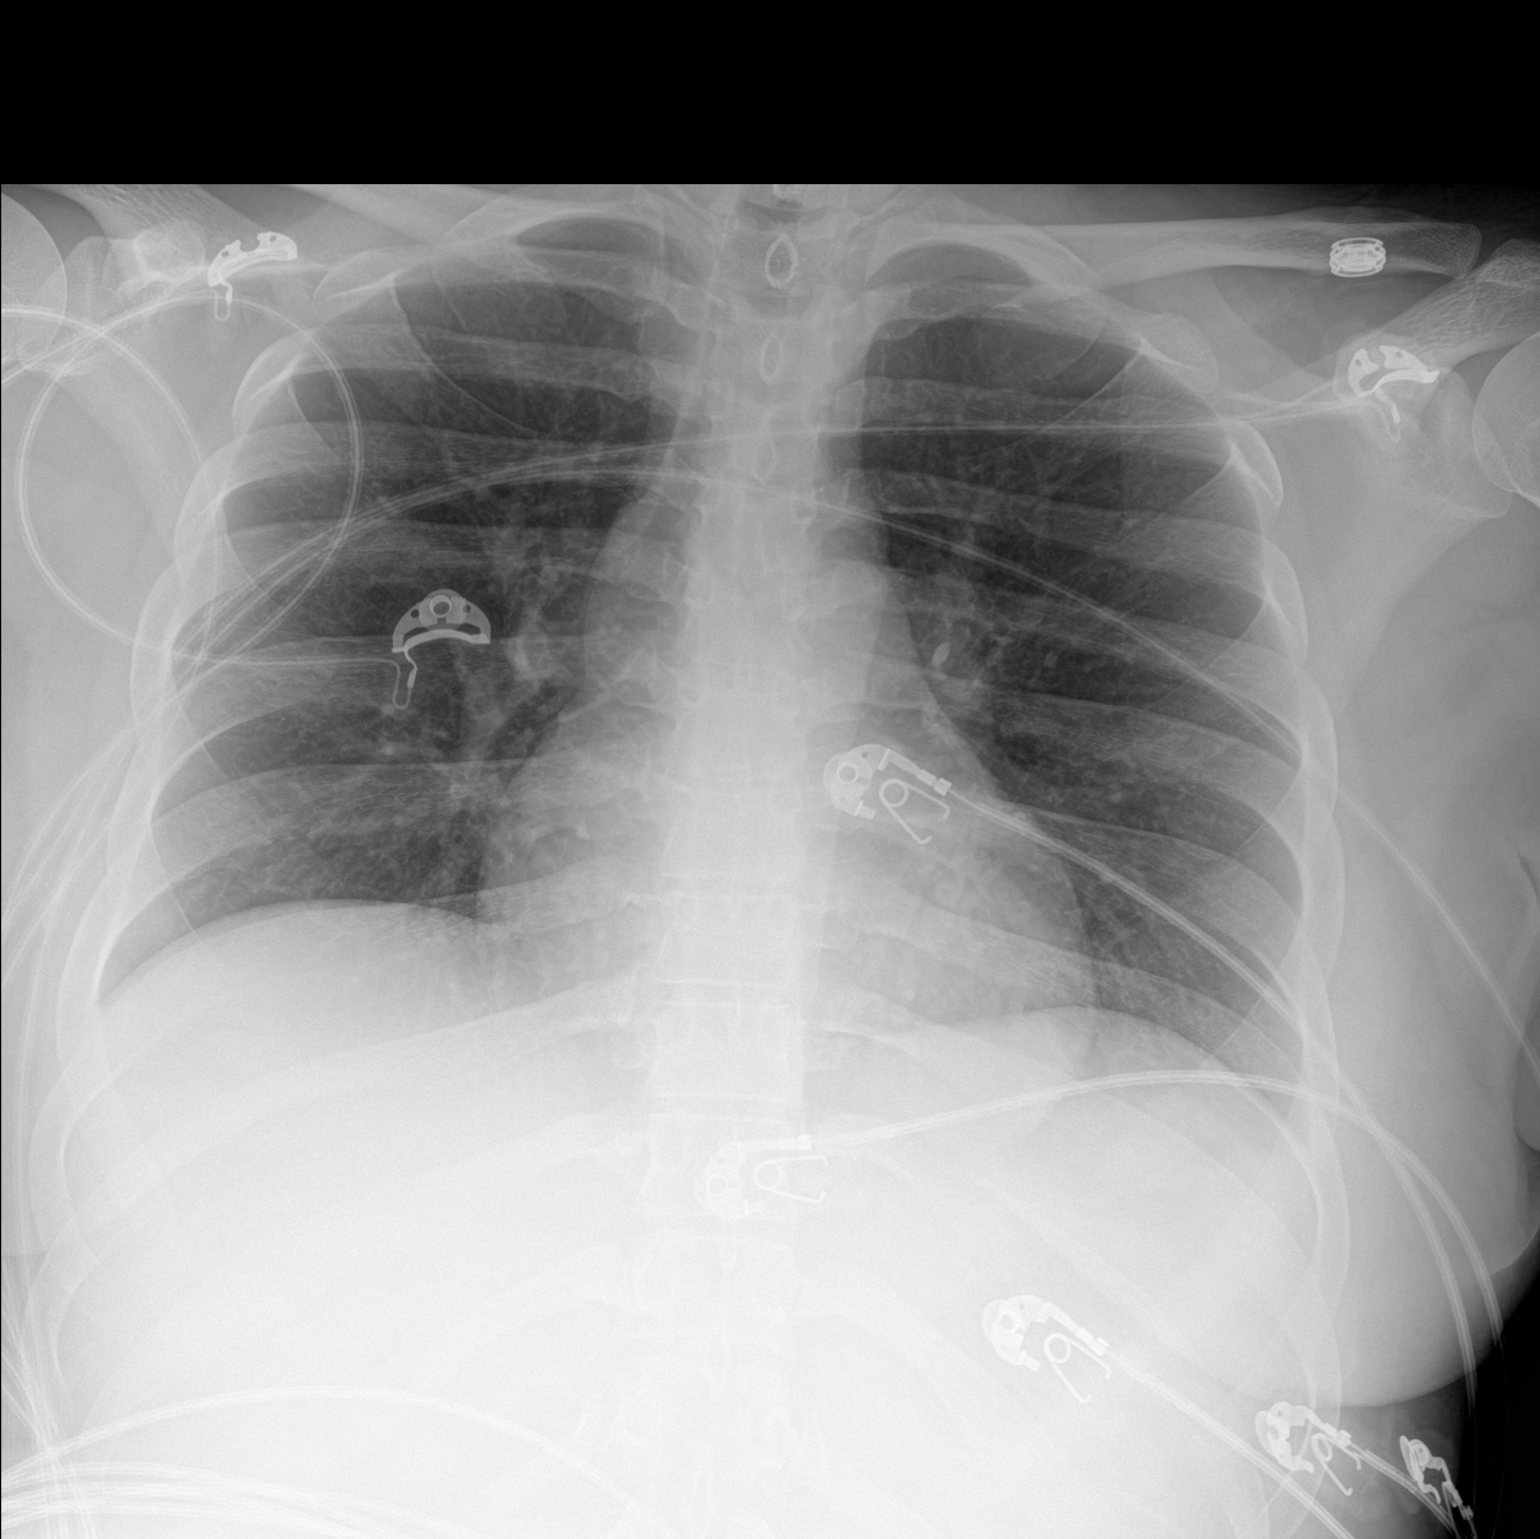

[1 of 1 positions shown; findings below may reference images not displayed]

FINDINGS: The cardiomediastinal contours are normal. The lungs are clear.
Pulmonary vasculature is normal. No consolidation, pleural effusion,
or pneumothorax. No acute osseous abnormalities are seen.
IMPRESSION: No acute chest findings.

## 2019-07-25 IMAGING — CT CT ABDOMEN AND PELVIS WITH CONTRAST
2 of 4 series · 16 of 46 positions shown, 18 images · IV contrast (Omni 300)
Comparison: None.

CLINICAL DATA: Acute abdominal pain, generalized, with nausea.
History of ovarian cyst.

EXAM:
CT ABDOMEN AND PELVIS WITH CONTRAST
TECHNIQUE: Multidetector CT imaging of the abdomen and pelvis was performed
using the standard protocol following bolus administration of
intravenous contrast.
CONTRAST:  100mL OMNIPAQUE IOHEXOL 300 MG/ML  SOLN

[Series 3: a/p w/ 5mm · axial · 0.84mm/px · z∈[-437,-22]mm · 13 of 93 slices shown, 15 images]
[im 5/93  soft-tissue]
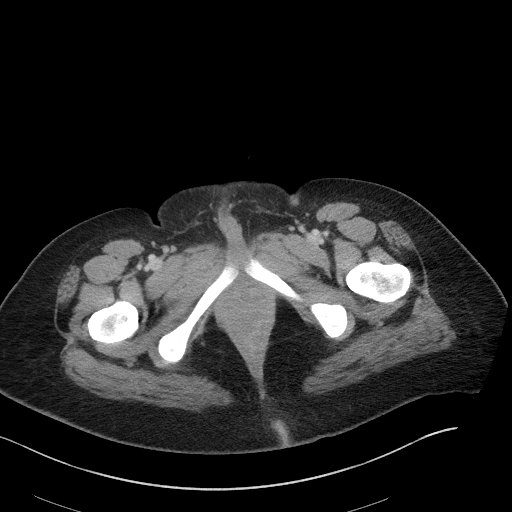
[im 5/93  bone]
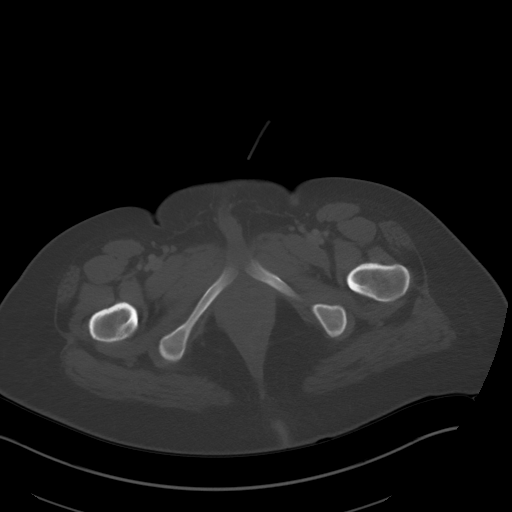
[im 14/93  soft-tissue]
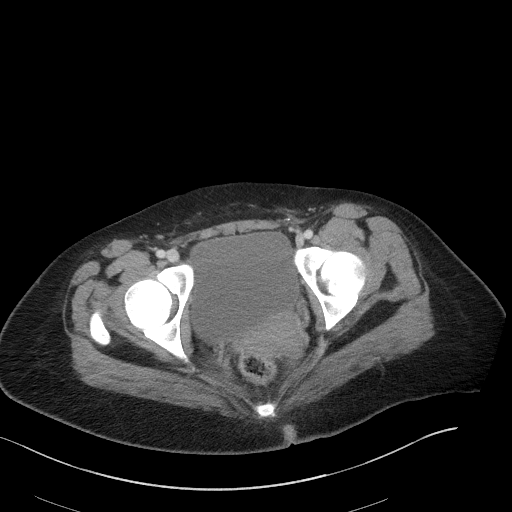
[im 18/93  soft-tissue]
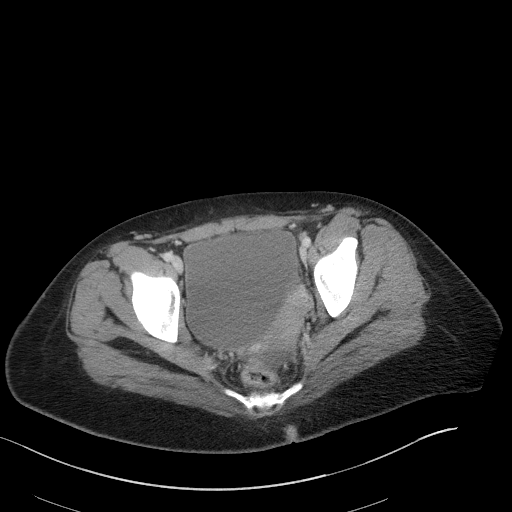
[im 27/93  soft-tissue]
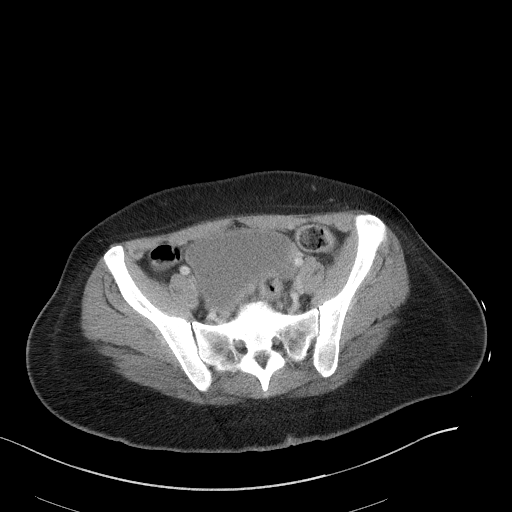
[im 31/93  soft-tissue]
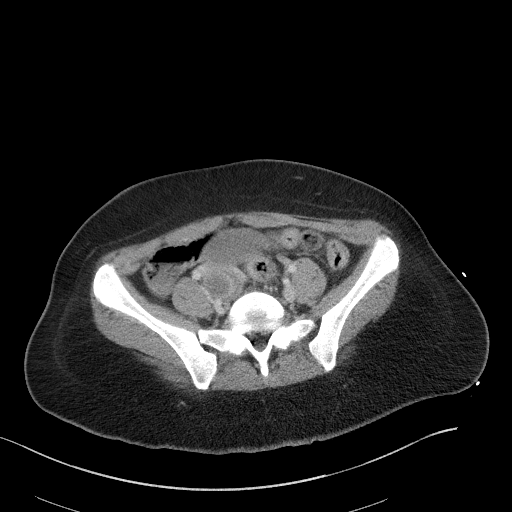
[im 40/93  soft-tissue]
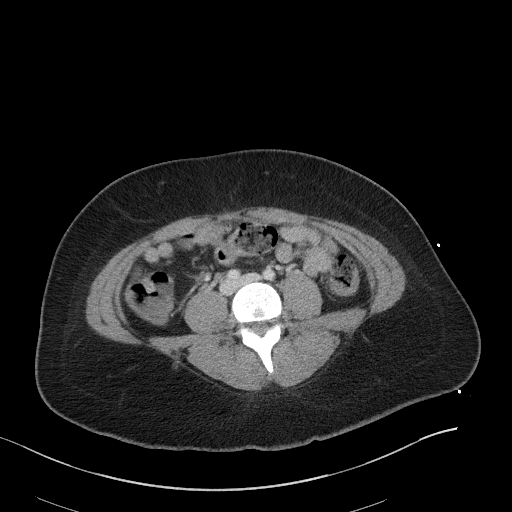
[im 49/93  soft-tissue]
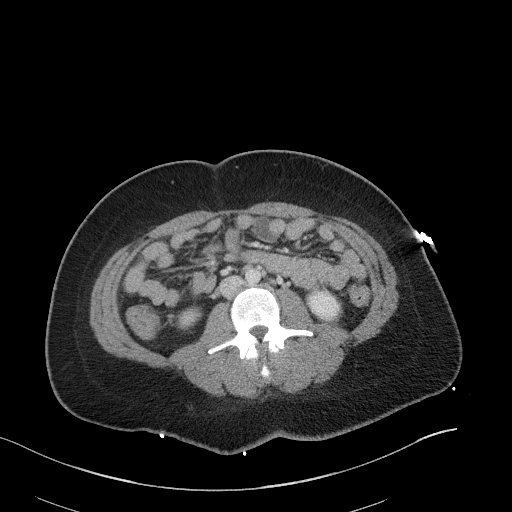
[im 53/93  soft-tissue]
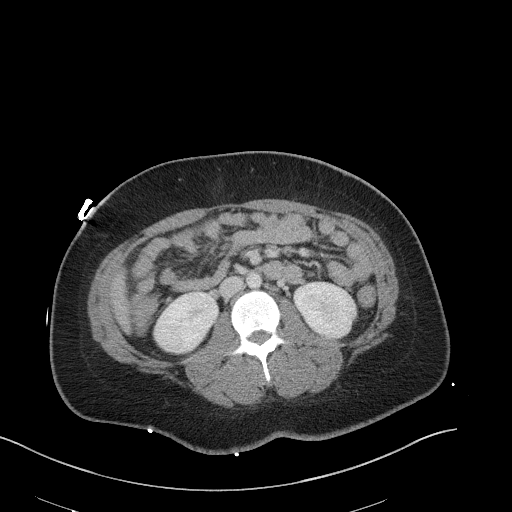
[im 62/93  soft-tissue]
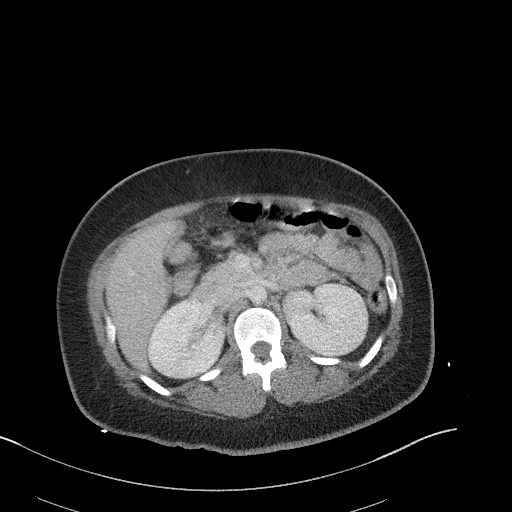
[im 62/93  bone]
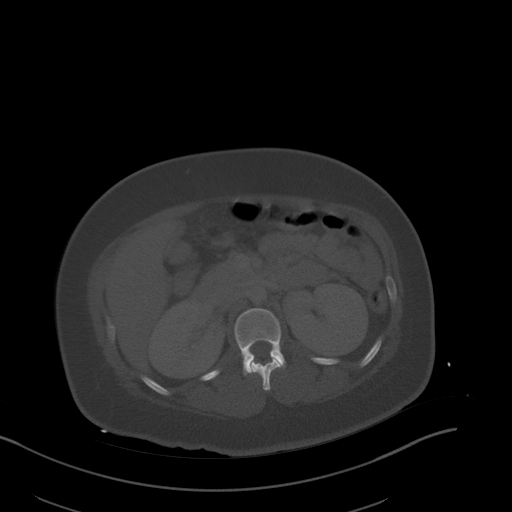
[im 66/93  soft-tissue]
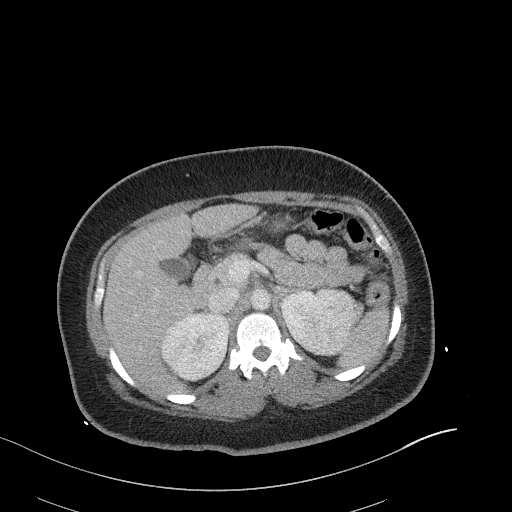
[im 75/93  soft-tissue]
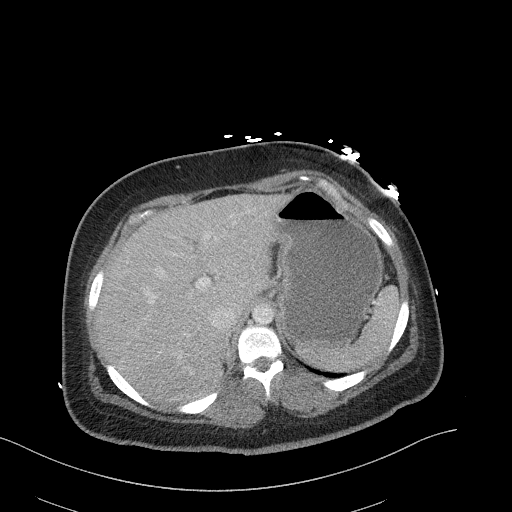
[im 79/93  soft-tissue]
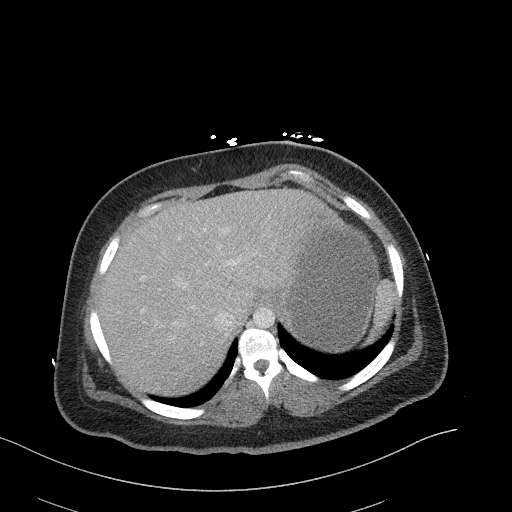
[im 88/93  soft-tissue]
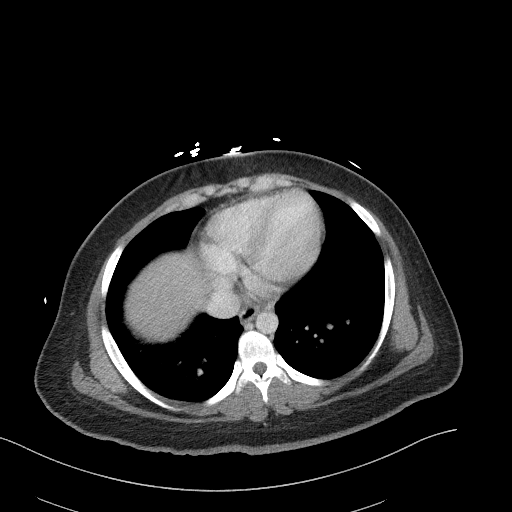

[Series 6: a/p w/ cor · coronal · 0.71mm/px · 3 of 121 slices shown]
[im 41/121  soft-tissue]
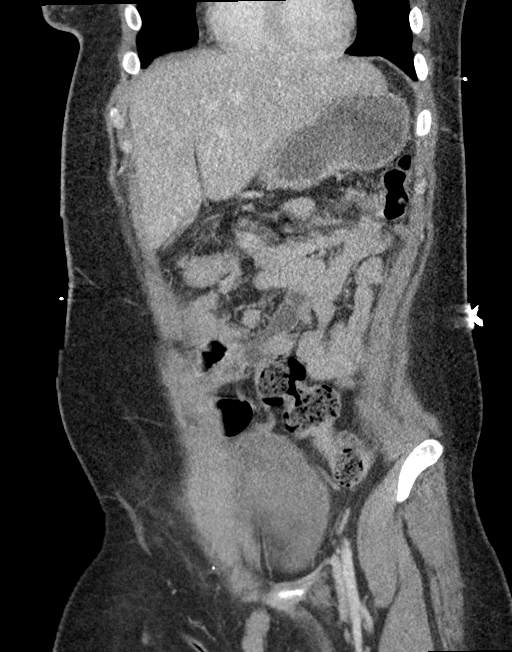
[im 54/121  soft-tissue]
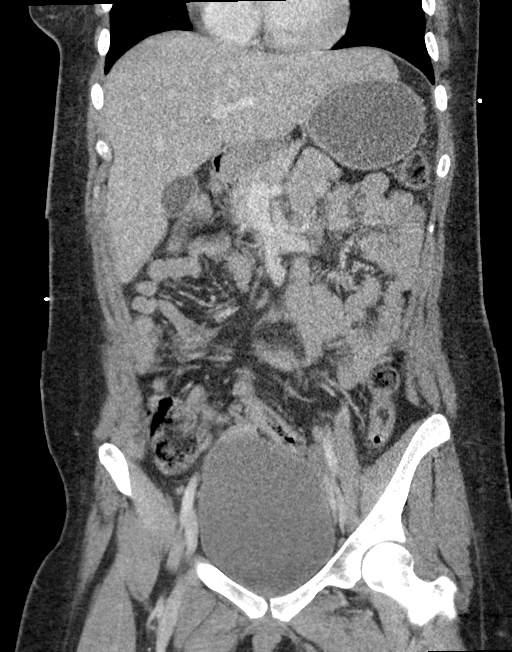
[im 67/121  soft-tissue]
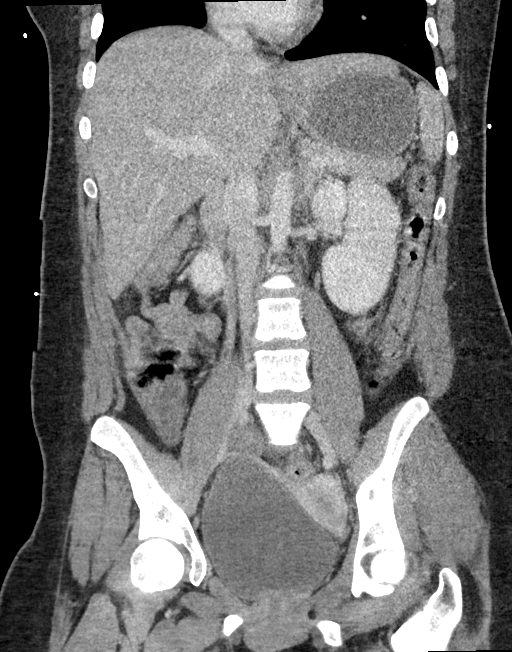

[16 of 46 positions shown; findings below may reference images not displayed]

FINDINGS: Lower chest: No acute abnormality.

Hepatobiliary: No focal liver abnormality is seen. No gallstones,
gallbladder wall thickening, or biliary dilatation.

Pancreas: Unremarkable. No pancreatic ductal dilatation or
surrounding inflammatory changes.

Spleen: Normal in size without focal abnormality.

Adrenals/Urinary Tract: Adrenal glands appear normal. Subtle
low-density focus within the lower pole of the LEFT kidney, possible
edema. No renal stone or hydronephrosis bilaterally. No perinephric
fluid. No ureteral or bladder calculi identified. Bladder appears
normal.

Stomach/Bowel: No dilated large or small bowel loops. No convincing
evidence of bowel wall inflammation. Stomach appears normal.
Appendix is normal.

Vascular/Lymphatic: No significant vascular findings are present. No
enlarged abdominal or pelvic lymph nodes.

Reproductive: Mild RIGHT adnexal fullness, probable small chronic
ovarian cyst. LEFT adnexal regions unremarkable. No free fluid or
inflammatory change within either adnexal region

Other: No free fluid or abscess collection seen. No free
intraperitoneal air.

Musculoskeletal: No acute or suspicious osseous finding.
IMPRESSION: 1. Subtle low-density focus within the lower pole of the LEFT
kidney, possible edema. Recommend correlation with urinalysis for
any evidence of pyelonephritis.
2. Mild RIGHT adnexal fullness, probable small chronic ovarian cyst.
No free fluid or inflammatory change within either adnexal region.
Consider nonemergent pelvic ultrasound at some point to ensure
benignity.
3. Remainder of the abdomen and pelvis CT is unremarkable, as
detailed above. No bowel obstruction or evidence of bowel wall
inflammation. No evidence of acute solid organ abnormality. No renal
or ureteral calculi. Appendix is normal.

## 2019-07-28 IMAGING — DX PORTABLE CHEST - 1 VIEW
1 series · 1 of 1 positions shown · non-contrast
Comparison: Multiple prior exams most recent radiograph 3 days ago.

CLINICAL DATA: Chest pain.

EXAM:
PORTABLE CHEST 1 VIEW

[chest]
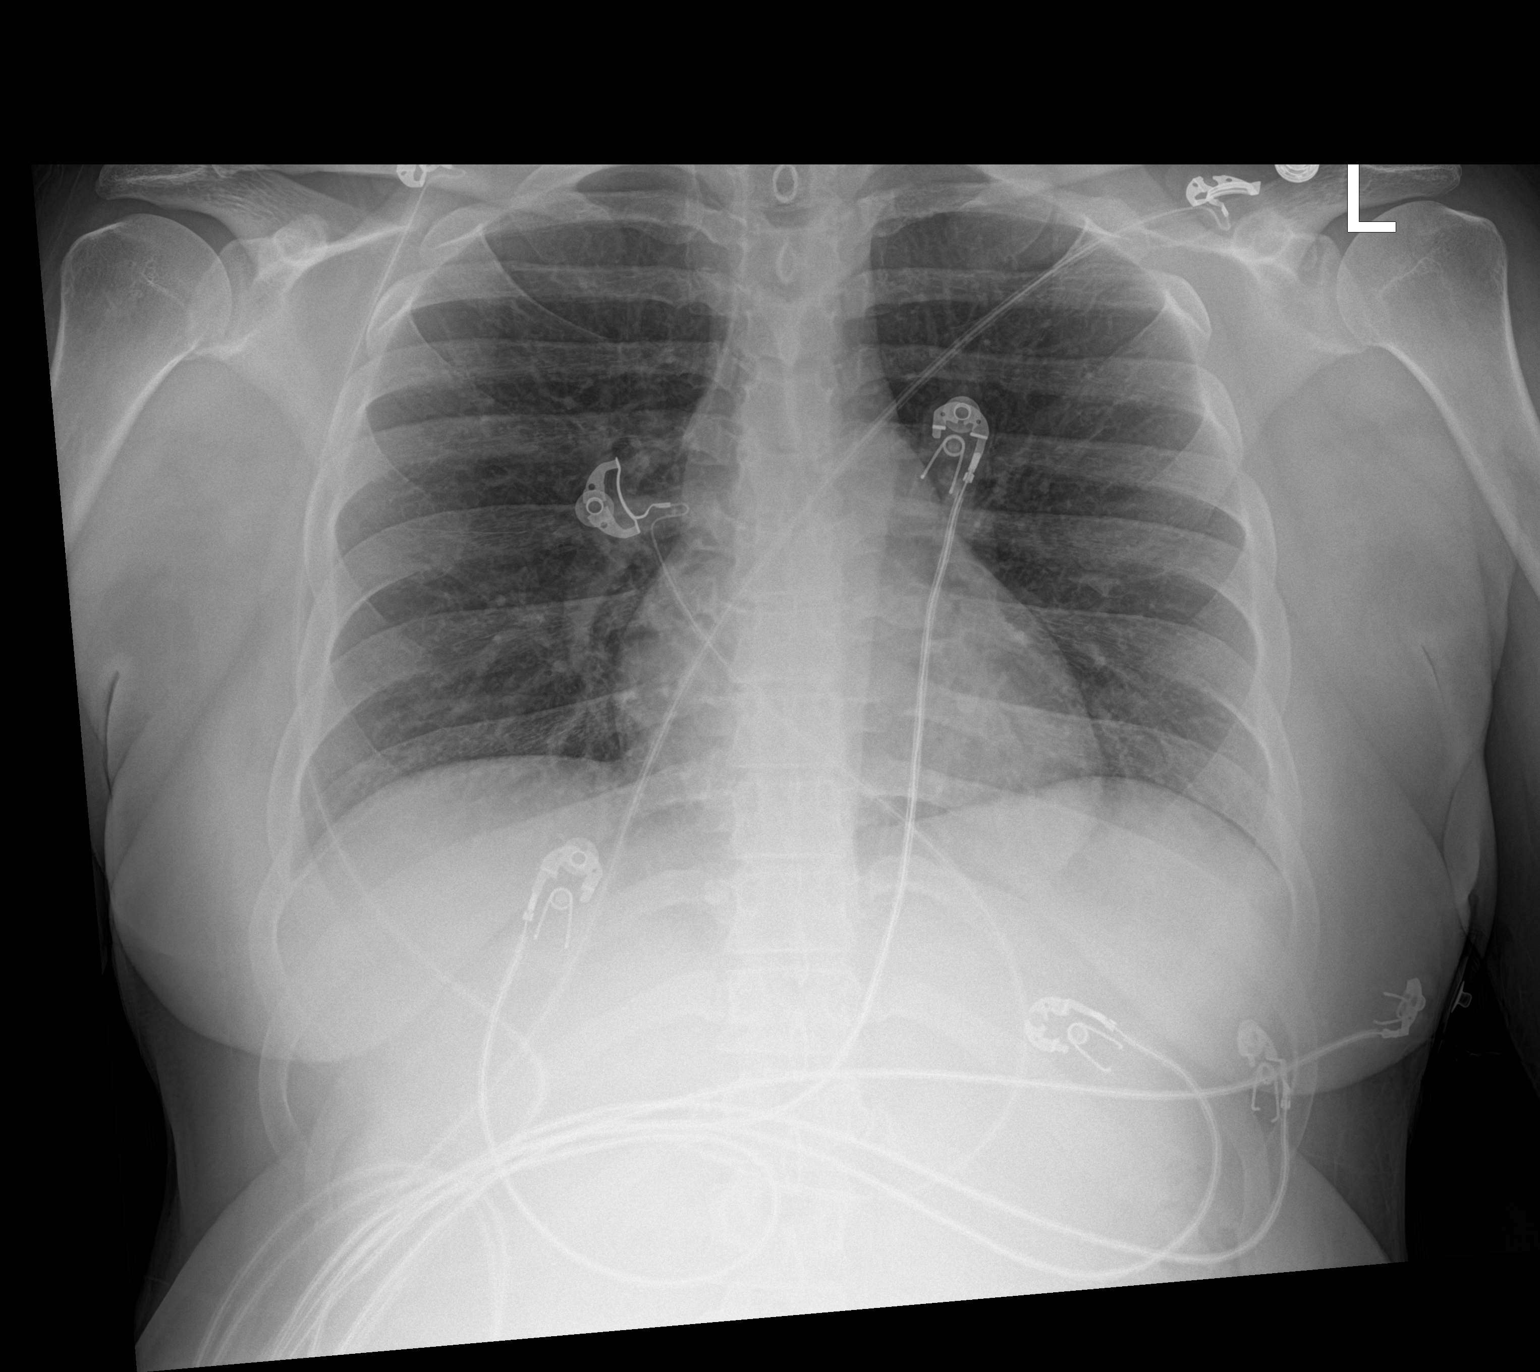

[1 of 1 positions shown; findings below may reference images not displayed]

FINDINGS: The cardiomediastinal contours are normal. The lungs are clear.
Pulmonary vasculature is normal. No consolidation, pleural effusion,
or pneumothorax. No acute osseous abnormalities are seen.
IMPRESSION: Negative AP view of the chest.

## 2019-08-03 IMAGING — CT CT HEAD WITHOUT CONTRAST
4 series · 17 of 47 positions shown, 19 images · non-contrast
Comparison: 06/20/2018

CLINICAL DATA: Ct head wo, Pt endorses having multiple seizures
today, has hx and takes keppra. Has hx of DM and CHF. Tachy, axox5,
slightly lethargic.

EXAM:
CT HEAD WITHOUT CONTRAST
TECHNIQUE: Contiguous axial images were obtained from the base of the skull
through the vertex without intravenous contrast.

[Series 2: head without · axial · non-contrast · 0.42mm/px · z∈[-105,+20]mm · 7 of 35 slices shown, 9 images]
[im 5/35  brain]
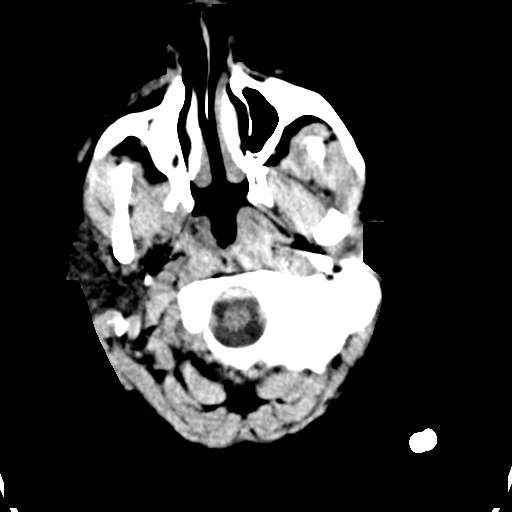
[im 5/35  bone]
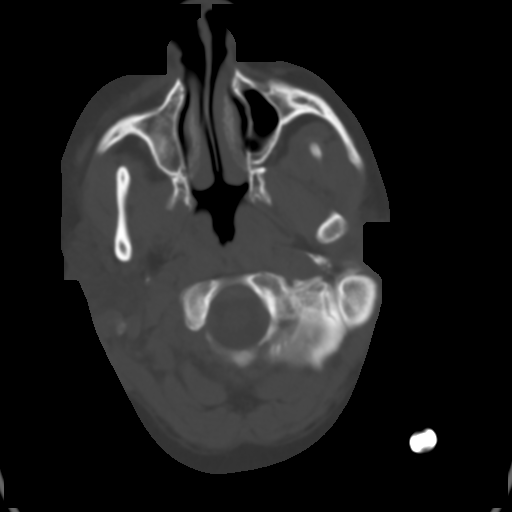
[im 9/35  brain]
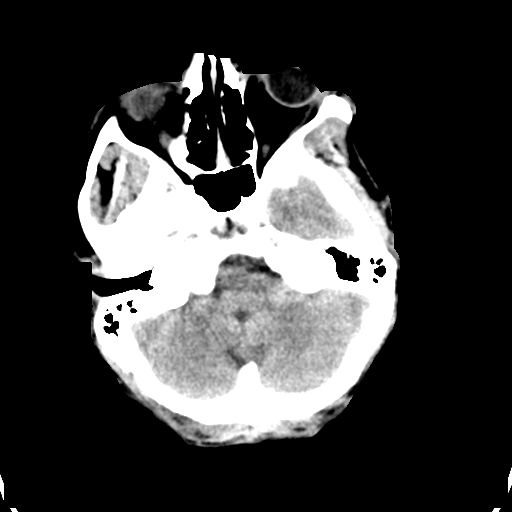
[im 13/35  brain]
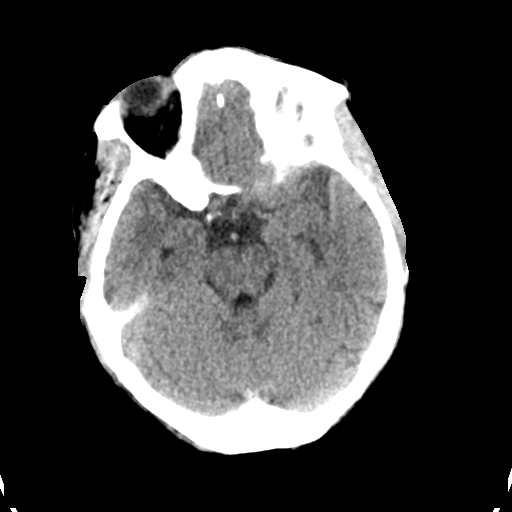
[im 18/35  brain]
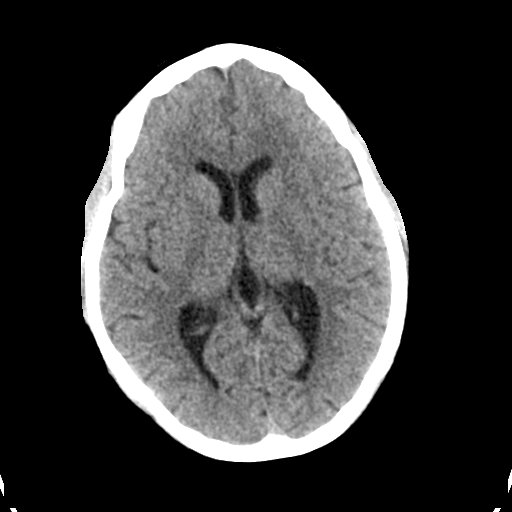
[im 22/35  brain]
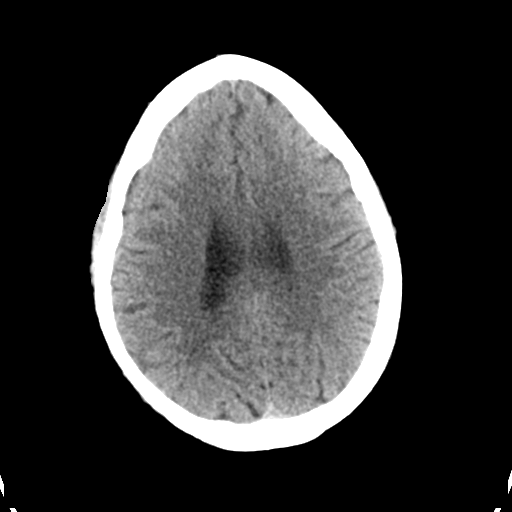
[im 22/35  bone]
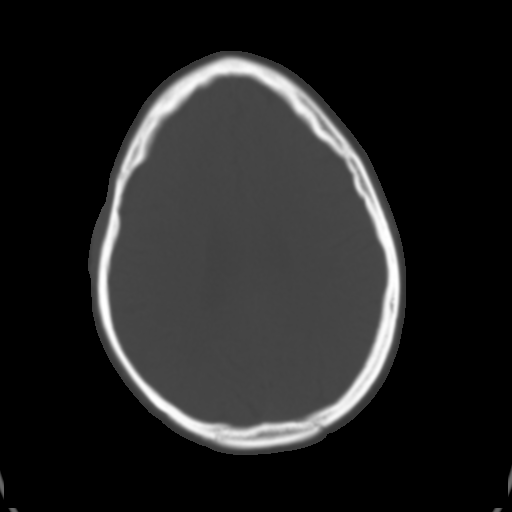
[im 26/35  brain]
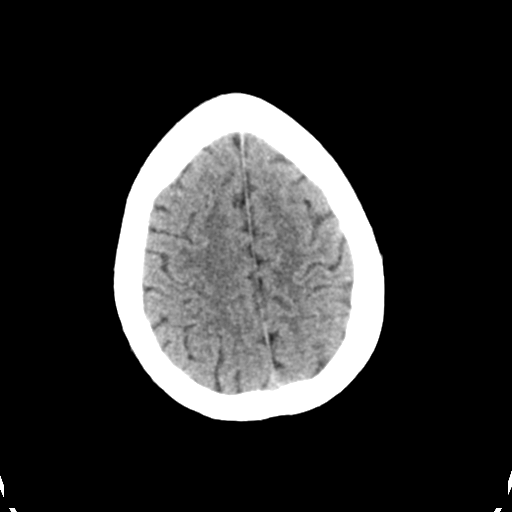
[im 30/35  brain]
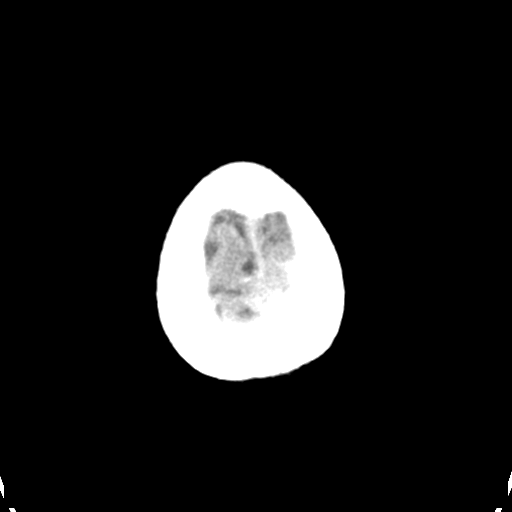

[Series 3: head bone · axial · 0.42mm/px · z∈[-109,-47]mm · 4 of 88 slices shown]
[im 9/88  bone]
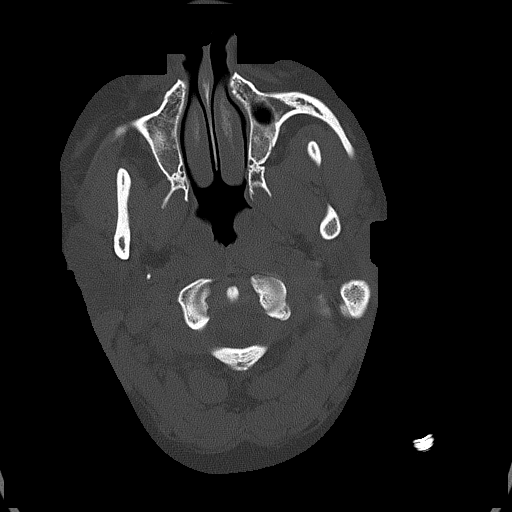
[im 18/88  bone]
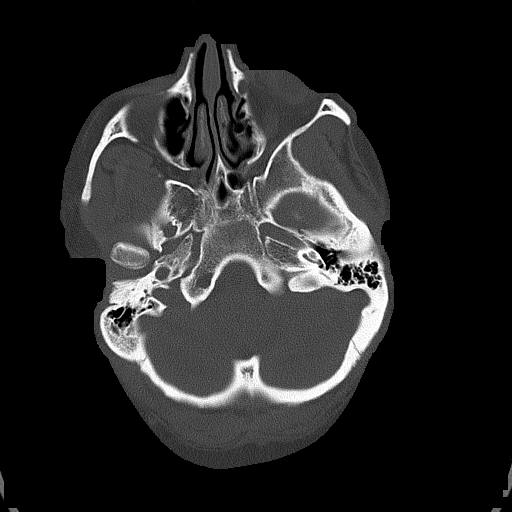
[im 27/88  bone]
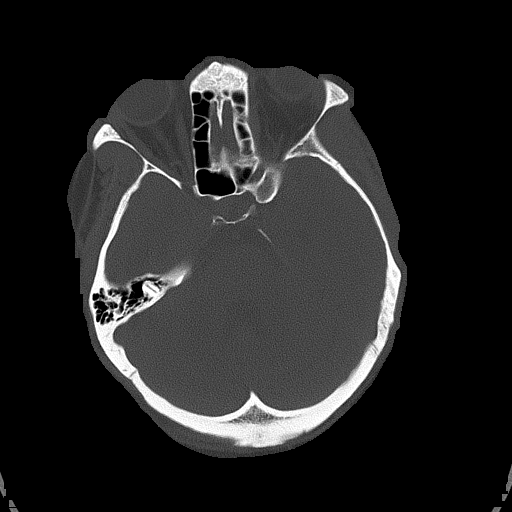
[im 40/88  bone]
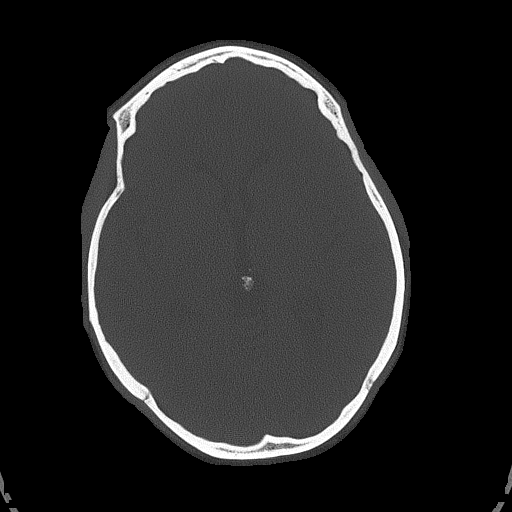

[Series 4: head without cor · coronal · non-contrast · 0.33mm/px · 3 of 69 slices shown]
[im 23/69  brain]
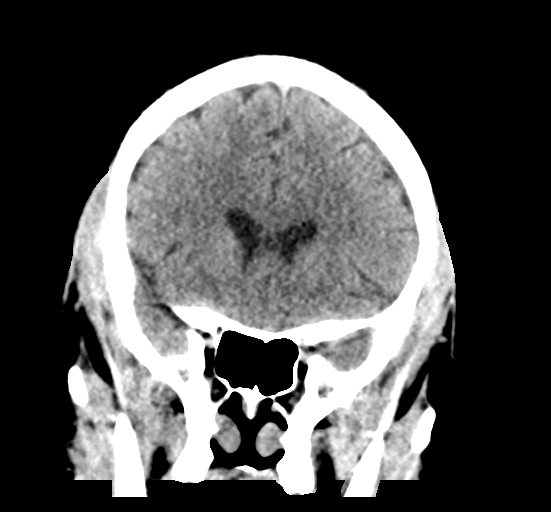
[im 31/69  brain]
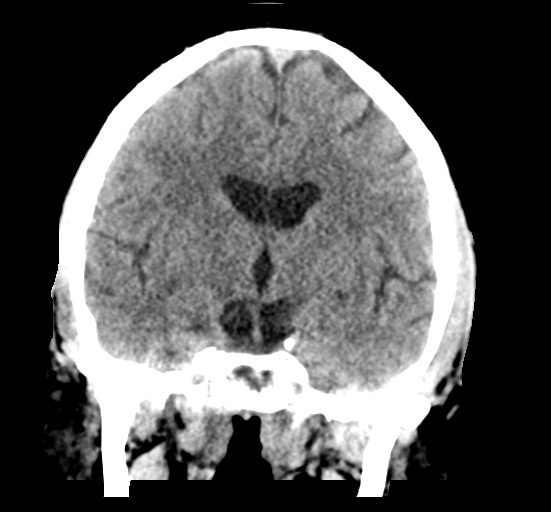
[im 38/69  brain]
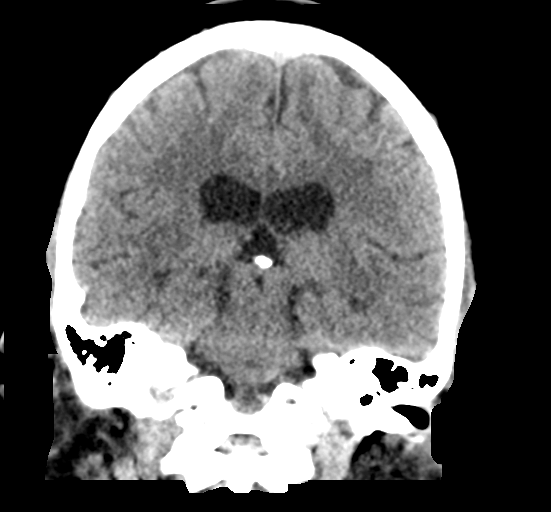

[Series 5: head without sag · sagittal · non-contrast · 0.34mm/px · 3 of 62 slices shown]
[im 21/62  brain]
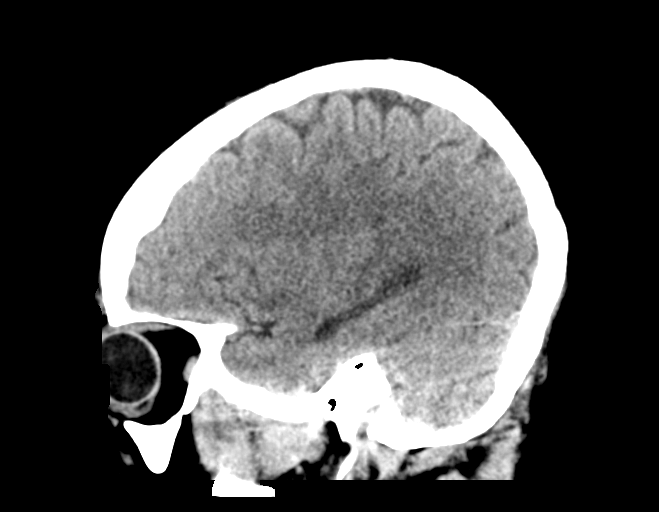
[im 31/62  brain]
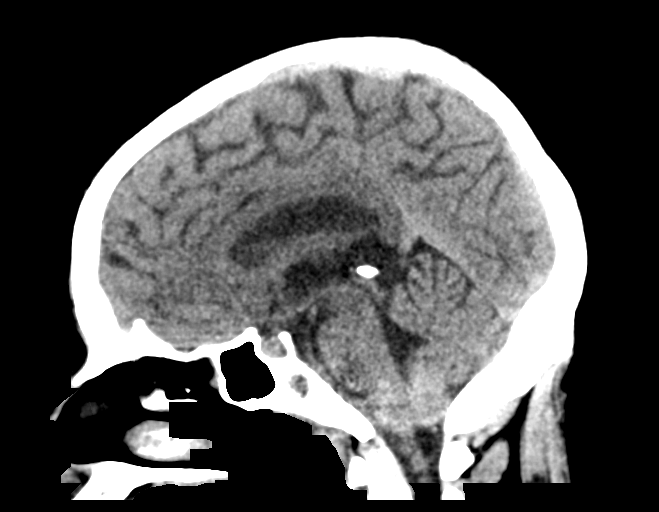
[im 41/62  brain]
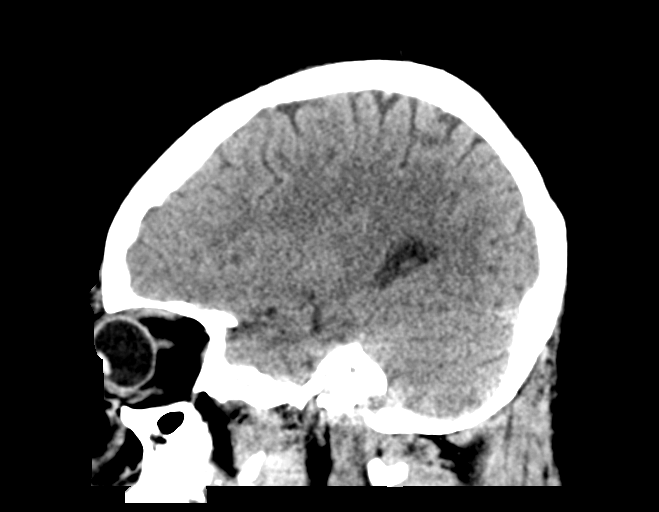

[17 of 47 positions shown; findings below may reference images not displayed]

FINDINGS: Brain: No evidence of acute infarction, hemorrhage, hydrocephalus,
extra-axial collection or mass lesion/mass effect.

Vascular: No hyperdense vessel or unexpected calcification.

Skull: Normal. Negative for fracture or focal lesion.

Sinuses/Orbits: Normal globes and orbits.  Clear sinuses.

Other: None.
IMPRESSION: Normal unenhanced CT scan of the brain.

## 2019-08-05 ENCOUNTER — Emergency Department (HOSPITAL_COMMUNITY)
Admission: EM | Admit: 2019-08-05 | Discharge: 2019-08-05 | Disposition: A | Discharge status: Home / Self Care | Payer: Medicaid Other | Attending: Emergency Medicine | Admitting: Emergency Medicine

## 2019-08-05 ENCOUNTER — Other Ambulatory Visit: Payer: Self-pay

## 2019-08-05 ENCOUNTER — Encounter (HOSPITAL_COMMUNITY): Payer: Self-pay

## 2019-08-05 DIAGNOSIS — E1165 Type 2 diabetes mellitus with hyperglycemia: Secondary | ICD-10-CM | POA: Diagnosis not present

## 2019-08-05 DIAGNOSIS — R1115 Cyclical vomiting syndrome unrelated to migraine: Secondary | ICD-10-CM

## 2019-08-05 DIAGNOSIS — I110 Hypertensive heart disease with heart failure: Secondary | ICD-10-CM | POA: Insufficient documentation

## 2019-08-05 DIAGNOSIS — E1110 Type 2 diabetes mellitus with ketoacidosis without coma: Secondary | ICD-10-CM | POA: Diagnosis not present

## 2019-08-05 DIAGNOSIS — K920 Hematemesis: Secondary | ICD-10-CM | POA: Diagnosis present

## 2019-08-05 DIAGNOSIS — R1084 Generalized abdominal pain: Secondary | ICD-10-CM | POA: Insufficient documentation

## 2019-08-05 DIAGNOSIS — Z79899 Other long term (current) drug therapy: Secondary | ICD-10-CM | POA: Insufficient documentation

## 2019-08-05 DIAGNOSIS — Z794 Long term (current) use of insulin: Secondary | ICD-10-CM | POA: Diagnosis not present

## 2019-08-05 DIAGNOSIS — I509 Heart failure, unspecified: Secondary | ICD-10-CM | POA: Insufficient documentation

## 2019-08-05 LAB — CBG MONITORING, ED: Glucose-Capillary: 99 mg/dL (ref 70–99)

## 2019-08-05 LAB — COMPREHENSIVE METABOLIC PANEL
ALT: 16 U/L (ref 0–44)
AST: 14 U/L — ABNORMAL LOW (ref 15–41)
Albumin: 4.7 g/dL (ref 3.5–5.0)
Alkaline Phosphatase: 98 U/L (ref 38–126)
Anion gap: 14 (ref 5–15)
BUN: 11 mg/dL (ref 6–20)
CO2: 23 mmol/L (ref 22–32)
Calcium: 9.5 mg/dL (ref 8.9–10.3)
Chloride: 99 mmol/L (ref 98–111)
Creatinine, Ser: 0.57 mg/dL (ref 0.44–1.00)
GFR calc Af Amer: >60 mL/min (ref >60)
GFR calc non Af Amer: >60 mL/min (ref >60)
Glucose, Bld: 380 mg/dL — ABNORMAL HIGH (ref 70–99)
Potassium: 4.1 mmol/L (ref 3.5–5.1)
Sodium: 136 mmol/L (ref 135–145)
Total Bilirubin: 0.5 mg/dL (ref 0.3–1.2)
Total Protein: 8.1 g/dL (ref 6.5–8.1)

## 2019-08-05 LAB — CBC
HCT: 43.4 % (ref 36.0–46.0)
Hemoglobin: 13.4 g/dL (ref 12.0–15.0)
MCH: 25 pg — ABNORMAL LOW (ref 26.0–34.0)
MCHC: 30.9 g/dL (ref 30.0–36.0)
MCV: 81.1 fL (ref 80.0–100.0)
Platelets: 313 10*3/uL (ref 150–400)
RBC: 5.35 MIL/uL — ABNORMAL HIGH (ref 3.87–5.11)
RDW: 14.8 % (ref 11.5–15.5)
WBC: 12 10*3/uL — ABNORMAL HIGH (ref 4.0–10.5)
nRBC: 0 % (ref 0.0–0.2)

## 2019-08-05 LAB — I-STAT BETA HCG BLOOD, ED (MC, WL, AP ONLY): I-stat hCG, quantitative: <5 m[IU]/mL (ref <5)

## 2019-08-05 LAB — HEMOGLOBIN AND HEMATOCRIT, BLOOD
HCT: 39.1 % (ref 36.0–46.0)
Hemoglobin: 12.3 g/dL (ref 12.0–15.0)

## 2019-08-05 LAB — TYPE AND SCREEN
ABO/RH(D): A POS
Antibody Screen: NEGATIVE

## 2019-08-05 MED ORDER — ONDANSETRON 4 MG PO TBDP
4.0000 mg | ORAL_TABLET | Freq: Once | ORAL | Status: AC
  Administered 2019-08-05: 4 mg via ORAL
  Filled 2019-08-05: qty 1

## 2019-08-05 MED ORDER — INSULIN ASPART 100 UNIT/ML ~~LOC~~ SOLN
10.0000 [IU] | Freq: Once | SUBCUTANEOUS | Status: AC
  Administered 2019-08-05: 10 [IU] via INTRAVENOUS
  Filled 2019-08-05: qty 0.1

## 2019-08-05 MED ORDER — SODIUM CHLORIDE 0.9 % IV BOLUS
1000.0000 mL | Freq: Once | INTRAVENOUS | Status: AC
  Administered 2019-08-05: 1000 mL via INTRAVENOUS

## 2019-08-05 MED ORDER — HYDROMORPHONE HCL 1 MG/ML IJ SOLN
1.0000 mg | Freq: Once | INTRAMUSCULAR | Status: AC
  Administered 2019-08-05: 1 mg via INTRAVENOUS
  Filled 2019-08-05: qty 1

## 2019-08-05 MED ORDER — PANTOPRAZOLE SODIUM 40 MG IV SOLR
40.0000 mg | Freq: Once | INTRAVENOUS | Status: AC
  Administered 2019-08-05: 40 mg via INTRAVENOUS
  Filled 2019-08-05: qty 40

## 2019-08-05 MED ORDER — ONDANSETRON HCL 4 MG/2ML IJ SOLN
4.0000 mg | Freq: Once | INTRAMUSCULAR | Status: AC
  Administered 2019-08-05: 4 mg via INTRAVENOUS
  Filled 2019-08-05: qty 2

## 2019-08-05 MED ORDER — SODIUM CHLORIDE 0.9 % IV SOLN: INTRAVENOUS | Status: DC

## 2019-08-05 NOTE — Discharge Instructions (Signed)
Continue your current medications.  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  Follow-up with your doctors.  Return for any new or worse symptoms.  No significant abnormalities today.

## 2019-08-05 NOTE — ED Notes (Signed)
Pt verbalizes understanding of DC instructions. Pt belongings returned and is ambulatory out of ED.  

## 2019-08-05 NOTE — ED Triage Notes (Signed)
Patient states she has been having generalized abdominal pain and vomiting a mixture of bright red and dark blood x 2 days, Patient states she has been having diarrhea, but no blood noted.

## 2019-08-05 NOTE — ED Provider Notes (Signed)
[facility]-EMERGENCY DEPT Provider Note   CSN: [id number] Arrival date & time: 08/05/19  1248     History Chief Complaint  Patient presents with  . Abdominal Pain  . Hematemesis    Nancy [name] is a 19 y.o. female.  Patient with a complaint of generalized abdominal pain nausea vomiting.  States that she had some mixtures of blood with the vomiting.  Abdominal pain has been for about a week.  The nausea and vomiting is been for about 2 to 3 days.  Patient came in from work.  Chart review shows that patient has had several episodes of this in the past has had multiple CT of the abdomen has seen gastroenterology and has had gastric emptying studies which have all been normal.  Patient states she is taking Reglan at home as well as Zofran.  In addition patient has a history of type 1 diabetes.  Patient states this is similar to which she is gone through many times before and they have not been able to figure out the cause.        Past Medical History:  Diagnosis Date  . Acanthosis nigricans   . Anxiety   . CHF (congestive heart failure) (HCC)   . Chronic lower back pain   . Depression   . DKA, type 1 (HCC) 09/13/2018  . Dyspepsia   . Obesity   . Ovarian cyst    pt is not aware of this hx (11/24/2017)  . 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 Premature baby   . Seizures (HCC)   . Type II diabetes mellitus (HCC)    insulin dependant    Patient Active Problem List   Diagnosis Date Noted  . Cellulitis   . DKA (diabetic ketoacidoses) (HCC) 01/27/2019  . Leukocytosis 12/19/2018  . Dehydration   . Pseudoseizures   . Type 1 diabetes mellitus with ketoacidosis without coma (HCC) 11/28/2018  . Hyperglycemia due to diabetes mellitus (HCC) 11/27/2018  . 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  . MDD (major depressive disorder), recurrent episode, severe (HCC) 11/06/2018  . Nonspecific abnormal electrocardiogram (ECG) (EKG)     . Chest pain of uncertain etiology   . Hypertensive urgency 10/08/2018  . Conversion disorder with attacks or seizures, acute episode, with psychological stressor 09/16/2018  . MDD (major depressive disorder), recurrent severe, without psychosis (HCC)   . AKI (acute kidney injury) (HCC) 08/04/2018  . Seizures (HCC) 08/03/2018  . Depression 07/25/2018  . Syncope 01/30/2018  . Orthostatic hypotension 01/24/2018  . Tachycardia 12/28/2017  . Chronic abdominal pain 12/24/2017  . Chest pain 12/19/2017  . Nausea and vomiting 08/21/2017  . Generalized abdominal pain 08/21/2017  . Non compliance with medical treatment 01/27/2012  . Adjustment disorder 09/16/2011  . Acanthosis nigricans   . Goiter   . Obesity 06/14/2010  . Hypertension 06/14/2010    Past Surgical History:  Procedure Laterality Date  . ABDOMINAL HERNIA REPAIR     "I was a baby"  . BIOPSY  10/12/2018   Procedure: BIOPSY;  Surgeon: Lynann Bologna, MD;  Location: [facility] ENDOSCOPY;  Service: Endoscopy;;  . ESOPHAGOGASTRODUODENOSCOPY (EGD) WITH PROPOFOL N/A 10/12/2018   Procedure: ESOPHAGOGASTRODUODENOSCOPY (EGD) WITH PROPOFOL;  Surgeon: Lynann Bologna, MD;  Location: [facility] ENDOSCOPY;  Service: Endoscopy;  Laterality: N/A;  . HERNIA REPAIR    . LEFT HEART CATH AND CORONARY ANGIOGRAPHY N/A 10/13/2018   Procedure: LEFT HEART CATH AND CORONARY ANGIOGRAPHY;  Surgeon: Kathleene Hazel, MD;  Location:  [facility] INVASIVE CV LAB;  Service: Cardiovascular;  Laterality: N/A;  . TONSILLECTOMY AND ADENOIDECTOMY    . WISDOM TOOTH EXTRACTION  2017     OB History   No obstetric history on file.     Family History  Problem Relation Age of Onset  . Diabetes Mother   . Hypertension Mother   . Obesity Mother   . Asthma Mother   . Allergic rhinitis Mother   . Eczema Mother   . Cervical cancer Mother   . Diabetes Father   . Hypertension Father   . Obesity Father   . Hyperlipidemia Father   . Hypertension Paternal Aunt   . Hypertension  Maternal Grandfather   . Colon cancer Maternal Grandfather   . Diabetes Paternal Grandmother   . Obesity Paternal Grandmother   . Diabetes Paternal Grandfather   . Obesity Paternal Grandfather   . Angioedema Neg Hx   . Immunodeficiency Neg Hx   . Urticaria Neg Hx   . Stomach cancer Neg Hx   . Esophageal cancer Neg Hx     Social History   Tobacco Use  . Smoking status: Never Smoker  . Smokeless tobacco: Never Used  Vaping Use  . Vaping Use: Never used  Substance Use Topics  . Alcohol use: No    Alcohol/week: 0.0 standard drinks  . Drug use: No    Home Medications Prior to Admission medications   Medication Sig Start Date End Date Taking? Authorizing Provider  atorvastatin (LIPITOR) 20 MG tablet Take 1 tablet (20 mg total) by mouth daily at 6 PM. 10/05/18  Yes Storm Frisk, MD  bisacodyl (DULCOLAX) 10 MG suppository Place 1 suppository (10 mg total) rectally daily. Patient taking differently: Place 10 mg rectally daily as needed for mild constipation.  12/24/18  Yes Swayze, Ava, DO  busPIRone (BUSPAR) 10 MG tablet Take 10 mg by mouth 3 (three) times daily.  11/19/18  Yes [provider]  cycloSPORINE (RESTASIS) 0.05 % ophthalmic emulsion Place 1 drop into both eyes 2 (two) times daily.   Yes [provider]  dicyclomine (BENTYL) 10 MG capsule Take 10 mg by mouth 3 (three) times daily as needed for spasms.  07/05/19  Yes [provider]  hydrOXYzine (ATARAX/VISTARIL) 50 MG tablet Take 1 tablet (50 mg total) by mouth 3 (three) times daily as needed for anxiety. Patient taking differently: Take 50 mg by mouth in the morning and at bedtime.  11/11/18  Yes Nwoko, Nicole Kindred I, NP  insulin aspart (NOVOLOG FLEXPEN) 100 UNIT/ML FlexPen Inject 15 Units into the skin 3 (three) times daily with meals.   Yes [provider]  lamoTRIgine (LAMICTAL) 25 MG tablet Take 2 tablets (50 mg total) by mouth 2 (two) times daily. For mood stabilization 11/11/18  Yes Nwoko,  Agnes I, NP  LANTUS SOLOSTAR 100 UNIT/ML Solostar Pen Inject 70 Units into the skin at bedtime.  07/05/19  Yes [provider]  lubiprostone (AMITIZA) 24 MCG capsule Take 1 capsule (24 mcg total) by mouth 2 (two) times daily with a meal. 10/27/18  Yes Esterwood, Amy S, PA-C  mirtazapine (REMERON) 7.5 MG tablet Take 1 tablet (7.5 mg total) by mouth at bedtime. For depression/sleep 11/11/18  Yes Armandina Stammer I, NP  Multiple Vitamin (MULTIVITAMIN WITH MINERALS) TABS tablet Take 1 tablet by mouth daily. 09/18/18  Yes Clapacs, Jackquline Denmark, MD  Olopatadine HCl (PAZEO) 0.7 % SOLN Place 2 drops into both eyes 2 (two) times daily.    Yes [provider]  pantoprazole (PROTONIX) 40 MG tablet Take 1 tablet (40 mg total) by mouth 2 (two) times daily. For acid reflux 01/29/19  Yes Jae Dire, MD  polyethylene glycol (MIRALAX / GLYCOLAX) 17 g packet Take 17 g by mouth daily. Patient taking differently: Take 17 g by mouth daily as needed for moderate constipation.  12/24/18  Yes Swayze, Ava, DO  prochlorperazine (COMPAZINE) 25 MG suppository Place 1 suppository (25 mg total) rectally every 12 (twelve) hours as needed for nausea or vomiting. 12/05/18  Yes Melene Plan, DO  promethazine (PHENERGAN) 25 MG suppository Place 25 mg rectally every 8 (eight) hours as needed for nausea or vomiting.   Yes [provider]  propranolol (INDERAL) 20 MG tablet Take 1 tablet (20 mg total) by mouth 2 (two) times daily. For anxiety/HTN 12/23/18  Yes Swayze, Ava, DO  Prucalopride Succinate (MOTEGRITY) 2 MG TABS Take 1 tablet (2 mg total) by mouth daily. 03/21/19  Yes Armbruster, Willaim Rayas, MD  QUEtiapine (SEROQUEL) 100 MG tablet Take 300 mg by mouth at bedtime. 07/05/19  Yes [provider]  sucralfate (CARAFATE) 1 GM/10ML suspension Take 1 g by mouth 3 (three) times daily. 07/05/19  Yes [provider]  topiramate (TOPAMAX) 25 MG tablet Take 1 tablet (25 mg total) by mouth 2 (two) times daily. For  seizure activities 11/11/18  Yes Nwoko, Nicole Kindred I, NP  Caraway Oil-Levomenthol (FDGARD) 25-20.75 MG CAPS Take 2 capsules by mouth as needed. Take as directed as needed Patient not taking: Reported on 08/05/2019 03/21/19   Benancio Deeds, MD  insulin glargine (LANTUS) 100 UNIT/ML injection Inject 0.6 mLs (60 Units total) into the skin at bedtime. For diabetes management Patient not taking: Reported on 08/05/2019 11/11/18   Armandina Stammer I, NP  QUEtiapine (SEROQUEL) 200 MG tablet Take 2 tablets (400 mg total) by mouth at bedtime. Patient not taking: Reported on 08/05/2019 01/15/19   Gwyneth Sprout, MD    Allergies    Ibuprofen and Oatmeal  Review of Systems   Review of Systems  Constitutional: Negative for chills and fever.  HENT: Negative for congestion, rhinorrhea and sore throat.   Eyes: Negative for visual disturbance.  Respiratory: Negative for cough and shortness of breath.   Cardiovascular: Negative for chest pain and leg swelling.  Gastrointestinal: Positive for abdominal pain, nausea and vomiting. Negative for blood in stool and diarrhea.  Genitourinary: Negative for dysuria.  Musculoskeletal: Negative for back pain and neck pain.  Skin: Negative for rash.  Neurological: Negative for dizziness, light-headedness and headaches.  Hematological: Does not bruise/bleed easily.  Psychiatric/Behavioral: Negative for confusion.    Physical Exam Updated Vital Signs BP (!) 135/79   Pulse 99   Temp 98.7 F (37.1 C) (Oral)   Resp 18   Ht 1.6 m (5\' 3" )   Wt (!) 97.5 kg   LMP 07/20/2019 (Approximate)   SpO2 99%   BMI 38.09 kg/m   Physical Exam Vitals and nursing note reviewed.  Constitutional:      General: She is not in acute distress.    Appearance: Normal appearance. She is well-developed.  HENT:     Head: Normocephalic and atraumatic.  Eyes:     Extraocular Movements: Extraocular movements intact.     Conjunctiva/sclera: Conjunctivae normal.     Pupils: Pupils are equal,  round, and reactive to light.  Cardiovascular:     Rate and Rhythm: Normal rate and regular rhythm.     Heart sounds: No murmur heard.  Pulmonary:     Effort: Pulmonary effort is normal. No respiratory distress.     Breath sounds: Normal breath sounds.  Abdominal:     Palpations: Abdomen is soft.     Tenderness: There is no abdominal tenderness. There is no guarding.  Musculoskeletal:        General: Normal range of motion.     Cervical back: Normal range of motion and neck supple.  Skin:    General: Skin is warm and dry.     Capillary Refill: Capillary refill takes less than 2 seconds.  Neurological:     General: No focal deficit present.     Mental Status: She is alert and oriented to person, place, and time.     Cranial Nerves: No cranial nerve deficit.     Sensory: No sensory deficit.     Motor: No weakness.     ED Results / Procedures / Treatments   Labs (all labs ordered are listed, but only abnormal results are displayed) Labs Reviewed  COMPREHENSIVE METABOLIC PANEL - Abnormal; Notable for the following components:      Result Value   Glucose, Bld 380 (*)    AST 14 (*)    All other components within normal limits  CBC - Abnormal; Notable for the following components:   WBC 12.0 (*)    RBC 5.35 (*)    MCH 25.0 (*)    All other components within normal limits  HEMOGLOBIN AND HEMATOCRIT, BLOOD  I-STAT BETA HCG BLOOD, ED ([facility], [facility], [facility] ONLY)  CBG MONITORING, ED  TYPE AND SCREEN    EKG None  Radiology No results found.  Procedures Procedures (including critical care time)  CRITICAL CARE Performed by: Vanetta Mulders Total critical care time: 35 minutes Critical care time was exclusive of separately billable procedures and treating other patients. Critical care was necessary to treat or prevent imminent or life-threatening deterioration. Critical care was time spent personally by me on the following activities: development of treatment plan with patient  and/or surrogate as well as nursing, discussions with consultants, evaluation of patient's response to treatment, examination of patient, obtaining history from patient or surrogate, ordering and performing treatments and interventions, ordering and review of laboratory studies, ordering and review of radiographic studies, pulse oximetry and re-evaluation of patient's condition.   Medications Ordered in ED Medications  0.9 %  sodium chloride infusion (has no administration in time range)  ondansetron (ZOFRAN-ODT) disintegrating tablet 4 mg (4 mg Oral Given 08/05/19 1339)  sodium chloride 0.9 % bolus 1,000 mL (0 mLs Intravenous Stopped 08/05/19 1750)  ondansetron (ZOFRAN) injection 4 mg (4 mg Intravenous Given 08/05/19 1605)  HYDROmorphone (DILAUDID) injection 1 mg (1 mg Intravenous Given 08/05/19 1605)  pantoprazole (PROTONIX) injection 40 mg (40 mg Intravenous Given 08/05/19 1606)  insulin aspart (novoLOG) injection 10 Units (10 Units Intravenous Given 08/05/19 1622)  HYDROmorphone (DILAUDID) injection 1 mg (1 mg Intravenous Given 08/05/19 1817)    ED Course  I have reviewed the triage vital signs and the nursing notes.  Pertinent labs & imaging results that were available during my care of the patient were reviewed by me and considered in my medical decision making (see chart for details).    MDM Rules/Calculators/A&P                          Patient given so Protonix given Zofran and given hydromorphone.  With overall improvement.  No vomiting of blood here.  Hemoglobin and hematocrit was repeated remained stable.  No significant change.  Patient's blood sugar was initially elevated at 380.  CO2 though was 23 so no signs of acidosis.  Patient given 10 units of regular insulin.  Blood blood sugars down to 99.  Overall with the treatment here patient feeling much better.  Seems to be hemodynamically stable.  Patient be given a work note for today and tomorrow.  She will continue her home  medications.  Patient has had multiple CTs of the abdomen without any acute findings.  Did not see a need to repeat that today.  Based on her exam.  No significant tenderness.  Patient's last CT abdomen without any acute findings was in February.   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

## 2019-08-06 ENCOUNTER — Inpatient Hospital Stay (HOSPITAL_COMMUNITY)
Admission: EM | Admit: 2019-08-06 | Discharge: 2019-08-09 | DRG: 638 | Disposition: A | Discharge status: Home / Self Care | Payer: Medicaid Other | Attending: Internal Medicine | Admitting: Internal Medicine

## 2019-08-06 DIAGNOSIS — Z794 Long term (current) use of insulin: Secondary | ICD-10-CM

## 2019-08-06 DIAGNOSIS — Z833 Family history of diabetes mellitus: Secondary | ICD-10-CM

## 2019-08-06 DIAGNOSIS — Z9119 Patient's noncompliance with other medical treatment and regimen: Secondary | ICD-10-CM

## 2019-08-06 DIAGNOSIS — R112 Nausea with vomiting, unspecified: Secondary | ICD-10-CM | POA: Diagnosis present

## 2019-08-06 DIAGNOSIS — I10 Essential (primary) hypertension: Secondary | ICD-10-CM | POA: Diagnosis present

## 2019-08-06 DIAGNOSIS — R569 Unspecified convulsions: Secondary | ICD-10-CM | POA: Diagnosis present

## 2019-08-06 DIAGNOSIS — F445 Conversion disorder with seizures or convulsions: Secondary | ICD-10-CM | POA: Diagnosis present

## 2019-08-06 DIAGNOSIS — E1010 Type 1 diabetes mellitus with ketoacidosis without coma: Principal | ICD-10-CM | POA: Diagnosis present

## 2019-08-06 DIAGNOSIS — Z6838 Body mass index (BMI) 38.0-38.9, adult: Secondary | ICD-10-CM

## 2019-08-06 DIAGNOSIS — Z79899 Other long term (current) drug therapy: Secondary | ICD-10-CM

## 2019-08-06 DIAGNOSIS — E1159 Type 2 diabetes mellitus with other circulatory complications: Secondary | ICD-10-CM | POA: Diagnosis present

## 2019-08-06 DIAGNOSIS — Z91199 Patient's noncompliance with other medical treatment and regimen due to unspecified reason: Secondary | ICD-10-CM

## 2019-08-06 DIAGNOSIS — F1290 Cannabis use, unspecified, uncomplicated: Secondary | ICD-10-CM | POA: Diagnosis present

## 2019-08-06 DIAGNOSIS — Z20822 Contact with and (suspected) exposure to covid-19: Secondary | ICD-10-CM | POA: Diagnosis present

## 2019-08-06 DIAGNOSIS — Z713 Dietary counseling and surveillance: Secondary | ICD-10-CM

## 2019-08-06 DIAGNOSIS — E860 Dehydration: Secondary | ICD-10-CM | POA: Diagnosis present

## 2019-08-06 DIAGNOSIS — G8929 Other chronic pain: Secondary | ICD-10-CM | POA: Diagnosis present

## 2019-08-06 DIAGNOSIS — Z9114 Patient's other noncompliance with medication regimen: Secondary | ICD-10-CM

## 2019-08-06 DIAGNOSIS — E875 Hyperkalemia: Secondary | ICD-10-CM | POA: Diagnosis present

## 2019-08-06 DIAGNOSIS — Z8249 Family history of ischemic heart disease and other diseases of the circulatory system: Secondary | ICD-10-CM

## 2019-08-06 DIAGNOSIS — F1190 Opioid use, unspecified, uncomplicated: Secondary | ICD-10-CM | POA: Diagnosis present

## 2019-08-06 DIAGNOSIS — E876 Hypokalemia: Secondary | ICD-10-CM | POA: Diagnosis present

## 2019-08-06 DIAGNOSIS — Z886 Allergy status to analgesic agent status: Secondary | ICD-10-CM

## 2019-08-06 DIAGNOSIS — D72829 Elevated white blood cell count, unspecified: Secondary | ICD-10-CM | POA: Diagnosis present

## 2019-08-06 DIAGNOSIS — I152 Hypertension secondary to endocrine disorders: Secondary | ICD-10-CM | POA: Diagnosis present

## 2019-08-06 DIAGNOSIS — K5909 Other constipation: Secondary | ICD-10-CM | POA: Diagnosis present

## 2019-08-06 DIAGNOSIS — F449 Dissociative and conversion disorder, unspecified: Secondary | ICD-10-CM | POA: Diagnosis present

## 2019-08-06 DIAGNOSIS — Z91018 Allergy to other foods: Secondary | ICD-10-CM

## 2019-08-06 DIAGNOSIS — D649 Anemia, unspecified: Secondary | ICD-10-CM | POA: Diagnosis present

## 2019-08-06 DIAGNOSIS — F419 Anxiety disorder, unspecified: Secondary | ICD-10-CM | POA: Diagnosis present

## 2019-08-06 DIAGNOSIS — E1110 Type 2 diabetes mellitus with ketoacidosis without coma: Secondary | ICD-10-CM | POA: Diagnosis present

## 2019-08-06 DIAGNOSIS — F338 Other recurrent depressive disorders: Secondary | ICD-10-CM | POA: Diagnosis present

## 2019-08-06 DIAGNOSIS — E669 Obesity, unspecified: Secondary | ICD-10-CM | POA: Diagnosis present

## 2019-08-06 LAB — URINALYSIS, ROUTINE W REFLEX MICROSCOPIC
Bacteria, UA: NONE SEEN
Bilirubin Urine: NEGATIVE
Glucose, UA: >=500 mg/dL — AB
Hgb urine dipstick: NEGATIVE
Ketones, ur: 80 mg/dL — AB
Leukocytes,Ua: NEGATIVE
Nitrite: NEGATIVE
Protein, ur: NEGATIVE mg/dL
Specific Gravity, Urine: 1.035 — ABNORMAL HIGH (ref 1.005–1.030)
pH: 5 (ref 5.0–8.0)

## 2019-08-06 LAB — CBC WITH DIFFERENTIAL/PLATELET
Abs Immature Granulocytes: 0.56 10*3/uL — ABNORMAL HIGH (ref 0.00–0.07)
Basophils Absolute: 0 10*3/uL (ref 0.0–0.1)
Basophils Relative: 0 %
Eosinophils Absolute: 0 10*3/uL (ref 0.0–0.5)
Eosinophils Relative: 0 %
HCT: 43.5 % (ref 36.0–46.0)
Hemoglobin: 13.8 g/dL (ref 12.0–15.0)
Immature Granulocytes: 6 %
Lymphocytes Relative: 10 %
Lymphs Abs: 1 10*3/uL (ref 0.7–4.0)
MCH: 25.6 pg — ABNORMAL LOW (ref 26.0–34.0)
MCHC: 31.7 g/dL (ref 30.0–36.0)
MCV: 80.6 fL (ref 80.0–100.0)
Monocytes Absolute: 0.7 10*3/uL (ref 0.1–1.0)
Monocytes Relative: 6 %
Neutro Abs: 8 10*3/uL — ABNORMAL HIGH (ref 1.7–7.7)
Neutrophils Relative %: 78 %
Platelets: 359 10*3/uL (ref 150–400)
RBC: 5.4 MIL/uL — ABNORMAL HIGH (ref 3.87–5.11)
RDW: 15.2 % (ref 11.5–15.5)
WBC: 10.3 10*3/uL (ref 4.0–10.5)
nRBC: 0.6 % — ABNORMAL HIGH (ref 0.0–0.2)

## 2019-08-06 LAB — RAPID URINE DRUG SCREEN, HOSP PERFORMED
Amphetamines: NOT DETECTED
Barbiturates: NOT DETECTED
Benzodiazepines: NOT DETECTED
Cocaine: NOT DETECTED
Opiates: POSITIVE — AB
Tetrahydrocannabinol: POSITIVE — AB

## 2019-08-06 LAB — BASIC METABOLIC PANEL
Anion gap: 6 (ref 5–15)
Anion gap: 11 (ref 5–15)
BUN: 10 mg/dL (ref 6–20)
BUN: 9 mg/dL (ref 6–20)
CO2: 22 mmol/L (ref 22–32)
CO2: 21 mmol/L — ABNORMAL LOW (ref 22–32)
Calcium: 8 mg/dL — ABNORMAL LOW (ref 8.9–10.3)
Calcium: 8.4 mg/dL — ABNORMAL LOW (ref 8.9–10.3)
Chloride: 112 mmol/L — ABNORMAL HIGH (ref 98–111)
Chloride: 109 mmol/L (ref 98–111)
Creatinine, Ser: 0.47 mg/dL (ref 0.44–1.00)
Creatinine, Ser: 0.43 mg/dL — ABNORMAL LOW (ref 0.44–1.00)
GFR calc Af Amer: >60 mL/min (ref >60)
GFR calc Af Amer: >60 mL/min (ref >60)
GFR calc non Af Amer: >60 mL/min (ref >60)
GFR calc non Af Amer: >60 mL/min (ref >60)
Glucose, Bld: 167 mg/dL — ABNORMAL HIGH (ref 70–99)
Glucose, Bld: 154 mg/dL — ABNORMAL HIGH (ref 70–99)
Potassium: 4.4 mmol/L (ref 3.5–5.1)
Potassium: 3.8 mmol/L (ref 3.5–5.1)
Sodium: 140 mmol/L (ref 135–145)
Sodium: 141 mmol/L (ref 135–145)

## 2019-08-06 LAB — CBG MONITORING, ED
Glucose-Capillary: 110 mg/dL — ABNORMAL HIGH (ref 70–99)
Glucose-Capillary: 134 mg/dL — ABNORMAL HIGH (ref 70–99)
Glucose-Capillary: 147 mg/dL — ABNORMAL HIGH (ref 70–99)
Glucose-Capillary: 149 mg/dL — ABNORMAL HIGH (ref 70–99)
Glucose-Capillary: 160 mg/dL — ABNORMAL HIGH (ref 70–99)
Glucose-Capillary: 160 mg/dL — ABNORMAL HIGH (ref 70–99)
Glucose-Capillary: 173 mg/dL — ABNORMAL HIGH (ref 70–99)
Glucose-Capillary: 178 mg/dL — ABNORMAL HIGH (ref 70–99)
Glucose-Capillary: 316 mg/dL — ABNORMAL HIGH (ref 70–99)
Glucose-Capillary: 364 mg/dL — ABNORMAL HIGH (ref 70–99)

## 2019-08-06 LAB — COMPREHENSIVE METABOLIC PANEL
ALT: 14 U/L (ref 0–44)
AST: 17 U/L (ref 15–41)
Albumin: 4.7 g/dL (ref 3.5–5.0)
Alkaline Phosphatase: 103 U/L (ref 38–126)
Anion gap: 22 — ABNORMAL HIGH (ref 5–15)
BUN: 11 mg/dL (ref 6–20)
CO2: 18 mmol/L — ABNORMAL LOW (ref 22–32)
Calcium: 9.7 mg/dL (ref 8.9–10.3)
Chloride: 98 mmol/L (ref 98–111)
Creatinine, Ser: 0.61 mg/dL (ref 0.44–1.00)
GFR calc Af Amer: >60 mL/min (ref >60)
GFR calc non Af Amer: >60 mL/min (ref >60)
Glucose, Bld: 325 mg/dL — ABNORMAL HIGH (ref 70–99)
Potassium: 4.2 mmol/L (ref 3.5–5.1)
Sodium: 138 mmol/L (ref 135–145)
Total Bilirubin: 1.2 mg/dL (ref 0.3–1.2)
Total Protein: 8.4 g/dL — ABNORMAL HIGH (ref 6.5–8.1)

## 2019-08-06 LAB — HEMOGLOBIN A1C
Hgb A1c MFr Bld: 11.8 % — ABNORMAL HIGH (ref 4.8–5.6)
Mean Plasma Glucose: 291.96 mg/dL

## 2019-08-06 LAB — SARS CORONAVIRUS 2 BY RT PCR (HOSPITAL ORDER, PERFORMED IN ~~LOC~~ HOSPITAL LAB): SARS Coronavirus 2: NEGATIVE

## 2019-08-06 LAB — LIPASE, BLOOD: Lipase: 22 U/L (ref 11–51)

## 2019-08-06 LAB — BLOOD GAS, VENOUS
Acid-base deficit: 4.4 mmol/L — ABNORMAL HIGH (ref 0.0–2.0)
Bicarbonate: 20.8 mmol/L (ref 20.0–28.0)
O2 Saturation: 67.4 %
Patient temperature: 98.6
pCO2, Ven: 40.6 mmHg — ABNORMAL LOW (ref 44.0–60.0)
pH, Ven: 7.329 (ref 7.250–7.430)
pO2, Ven: 41.3 mmHg (ref 32.0–45.0)

## 2019-08-06 LAB — I-STAT BETA HCG BLOOD, ED (MC, WL, AP ONLY): I-stat hCG, quantitative: <5 m[IU]/mL (ref <5)

## 2019-08-06 LAB — HEMOGLOBIN AND HEMATOCRIT, BLOOD
HCT: 38.3 % (ref 36.0–46.0)
Hemoglobin: 11.8 g/dL — ABNORMAL LOW (ref 12.0–15.0)

## 2019-08-06 LAB — BETA-HYDROXYBUTYRIC ACID: Beta-Hydroxybutyric Acid: 4.27 mmol/L — ABNORMAL HIGH (ref 0.05–0.27)

## 2019-08-06 IMAGING — CT CT CERVICAL SPINE WITHOUT CONTRAST
3 of 4 series · 14 of 33 positions shown, 17 images · non-contrast
Comparison: 08/03/2018

CLINICAL DATA: Head trauma

EXAM:
CT HEAD WITHOUT CONTRAST
CT CERVICAL SPINE WITHOUT CONTRAST
TECHNIQUE: Multidetector CT imaging of the head and cervical spine was
performed following the standard protocol without intravenous
contrast. Multiplanar CT image reconstructions of the cervical spine
were also generated.

[Series 6: c_spine 2.0 sag bone · sagittal · 0.39mm/px · 5 of 61 slices shown, 6 images]
[im 21/61  bone]
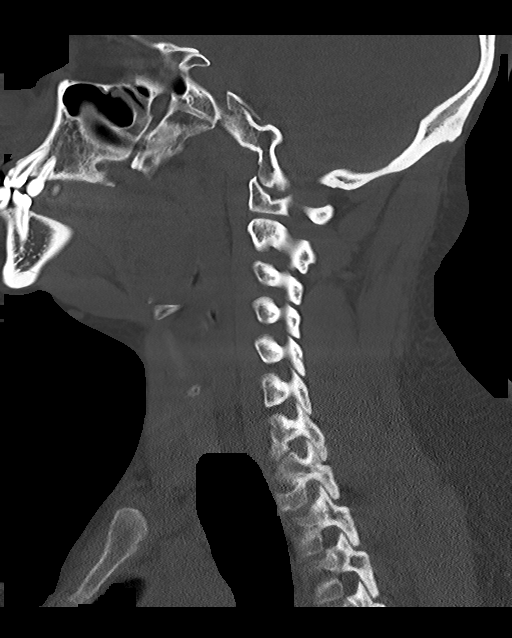
[im 26/61  bone]
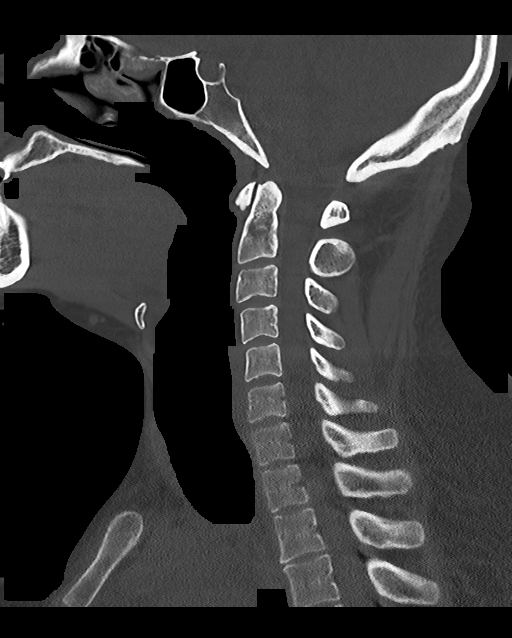
[im 31/61  soft-tissue]
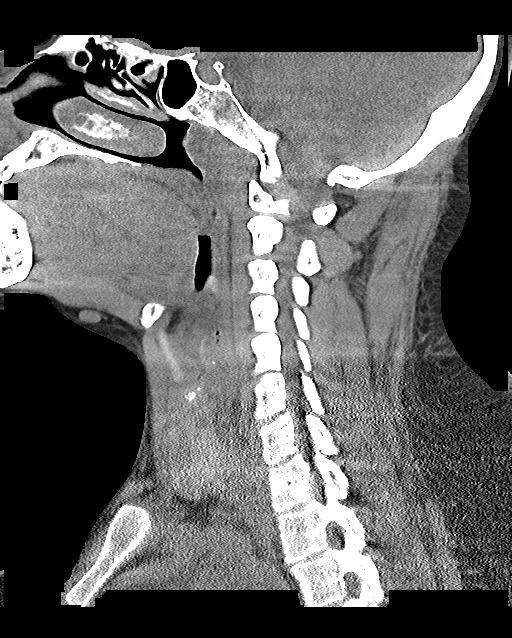
[im 31/61  bone]
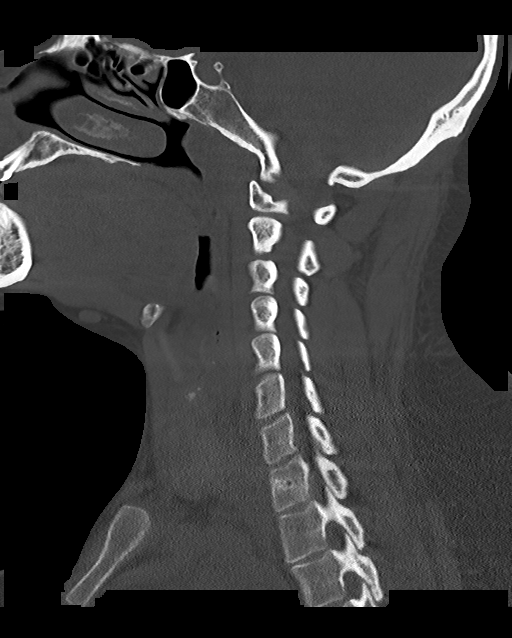
[im 36/61  bone]
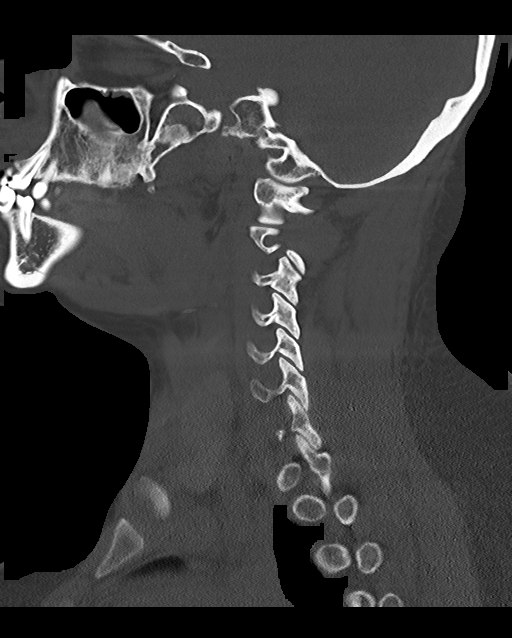
[im 41/61  bone]
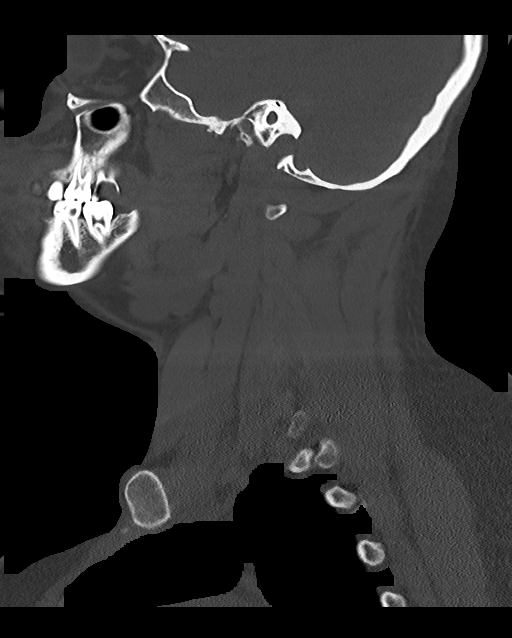

[Series 7: c_spine 2.0 cor bone · coronal · 0.32mm/px · 3 of 61 slices shown]
[im 13/61  bone]
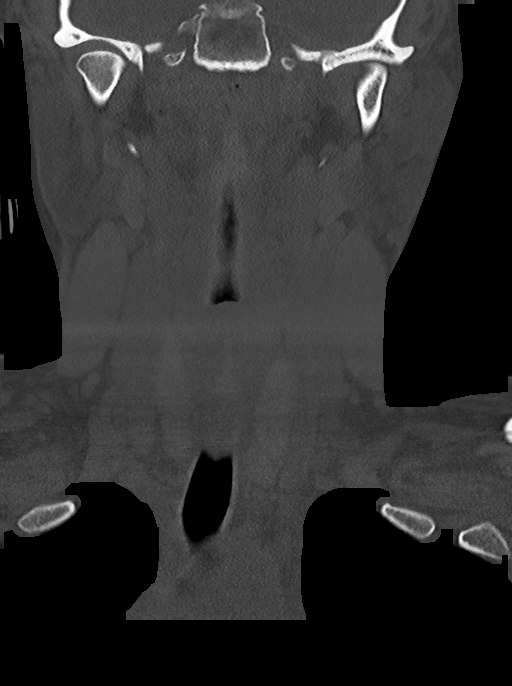
[im 25/61  bone]
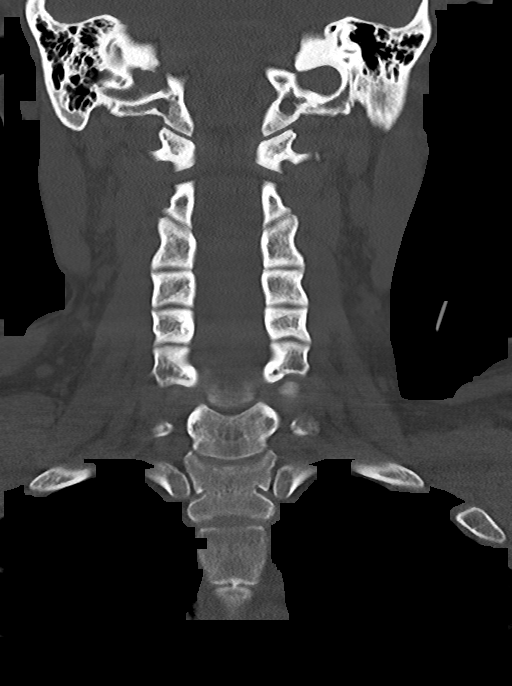
[im 37/61  bone]
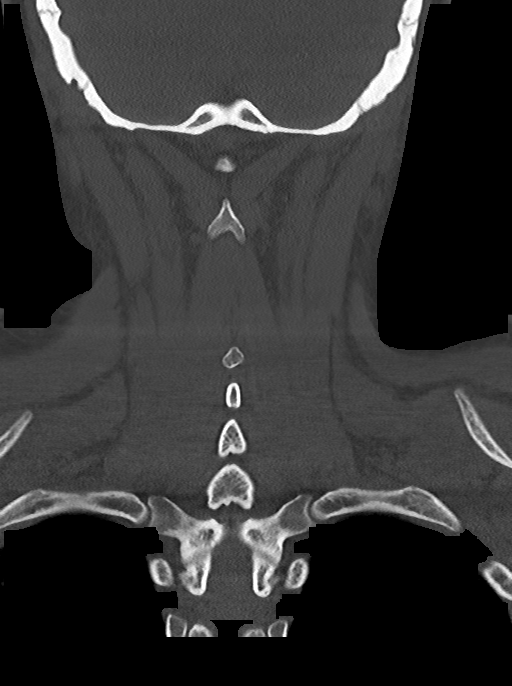

[Series 9: c_spine 1.0 st thins · axial · 0.31mm/px · z∈[-315,-139]mm · 6 of 315 slices shown, 8 images]
[im 32/315  soft-tissue]
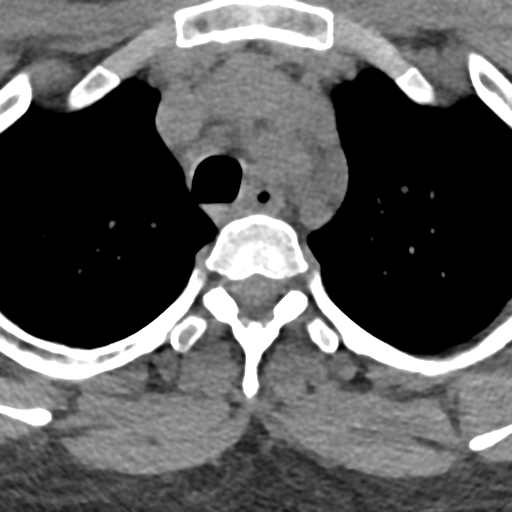
[im 32/315  bone]
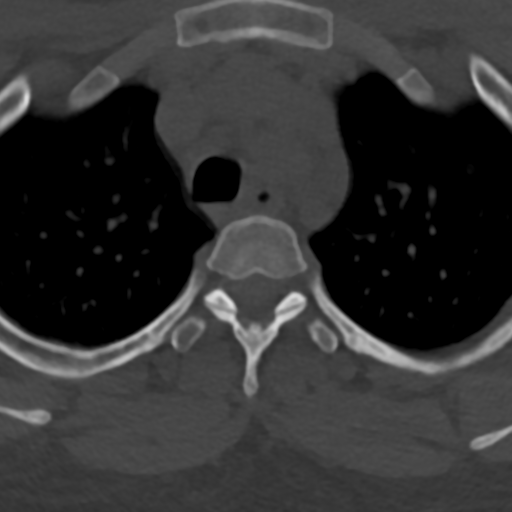
[im 95/315  bone]
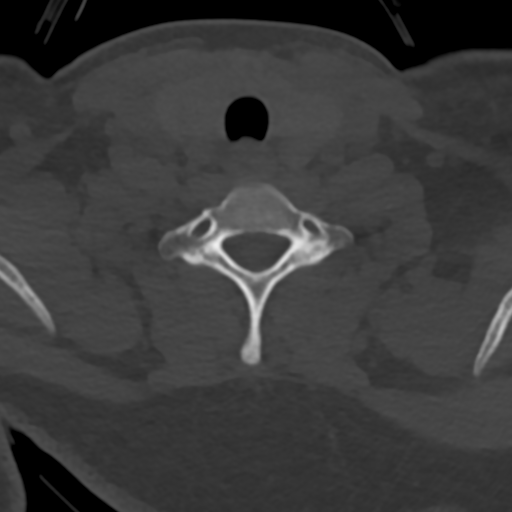
[im 126/315  bone]
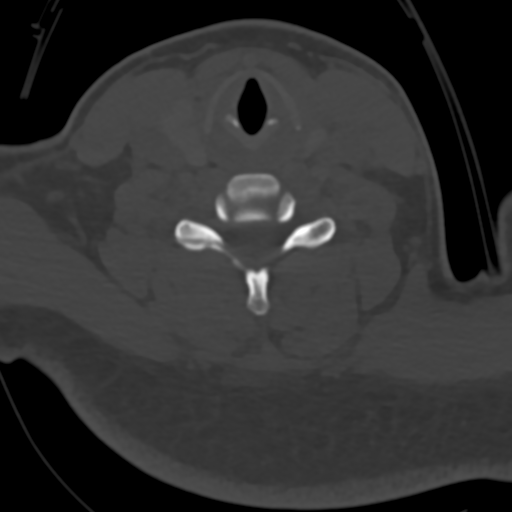
[im 189/315  bone]
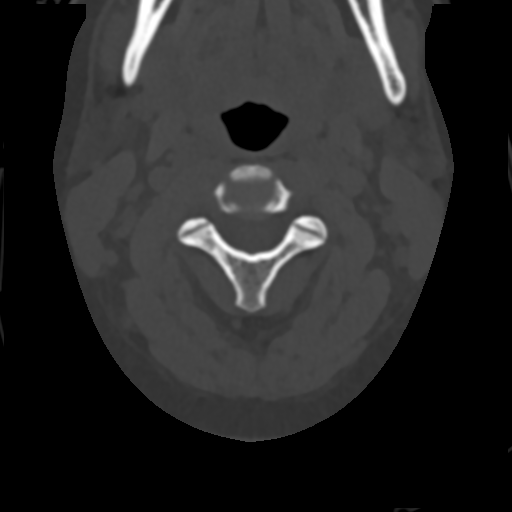
[im 220/315  soft-tissue]
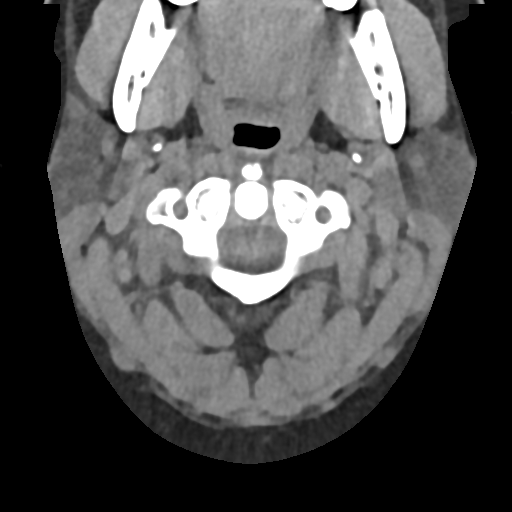
[im 220/315  bone]
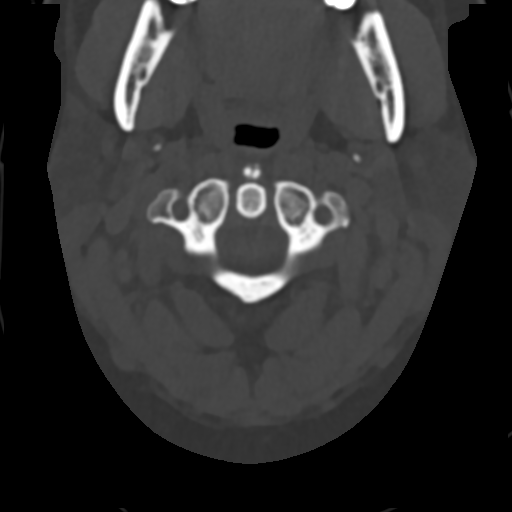
[im 283/315  bone]
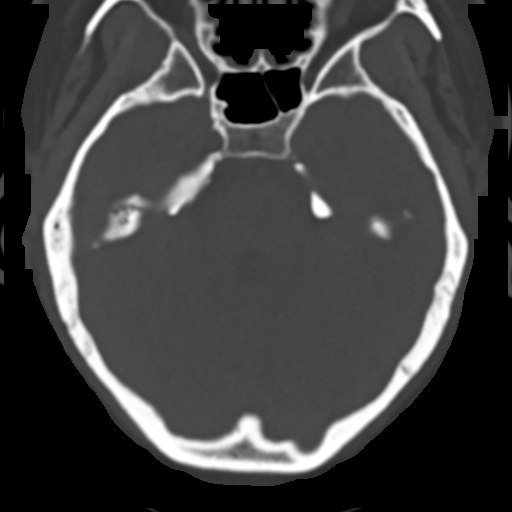

[14 of 33 positions shown; findings below may reference images not displayed]

FINDINGS: CT HEAD FINDINGS

Brain: No evidence of acute infarction, hemorrhage, hydrocephalus,
extra-axial collection or mass lesion/mass effect.

Vascular: No hyperdense vessel or unexpected calcification.

Skull: Normal. Negative for fracture or focal lesion.

Sinuses/Orbits: No acute finding.

Other: None.

CT CERVICAL SPINE FINDINGS

Alignment: Normal.

Skull base and vertebrae: No acute fracture. No primary bone lesion
or focal pathologic process.

Soft tissues and spinal canal: No prevertebral fluid or swelling. No
visible canal hematoma.

Disc levels:  Intact.

Upper chest: Negative.

Other: None.
IMPRESSION: 1.  No acute intracranial pathology.

2.  No fracture or static subluxation of the cervical spine.

## 2019-08-06 IMAGING — CT CT HEAD WITHOUT CONTRAST
3 of 4 series · 13 of 47 positions shown, 15 images · non-contrast
Comparison: 08/03/2018

CLINICAL DATA: Head trauma

EXAM:
CT HEAD WITHOUT CONTRAST
CT CERVICAL SPINE WITHOUT CONTRAST
TECHNIQUE: Multidetector CT imaging of the head and cervical spine was
performed following the standard protocol without intravenous
contrast. Multiplanar CT image reconstructions of the cervical spine
were also generated.

[Series 3: head without · axial · non-contrast · 0.46mm/px · z∈[-172,-38]mm · 7 of 37 slices shown, 9 images]
[im 5/37  brain]
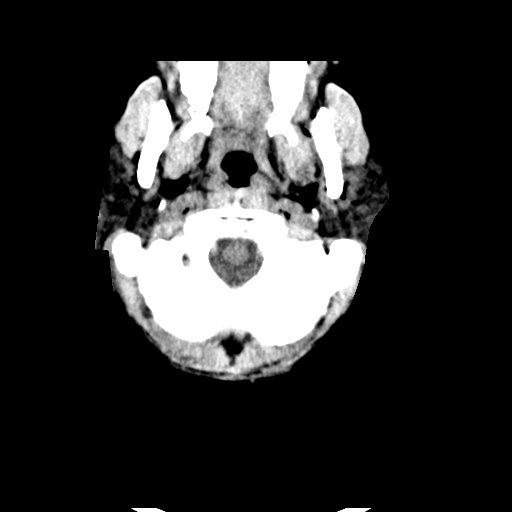
[im 5/37  bone]
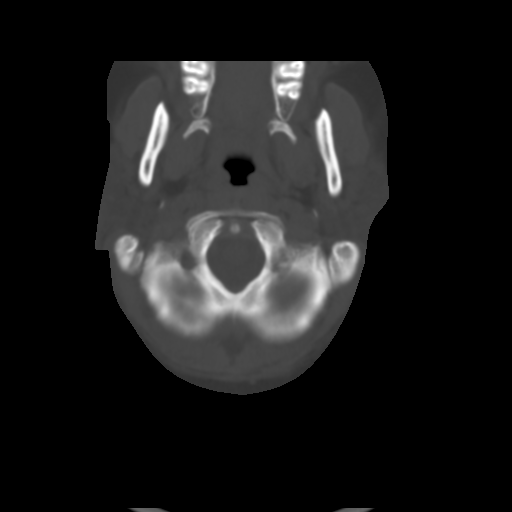
[im 10/37  brain]
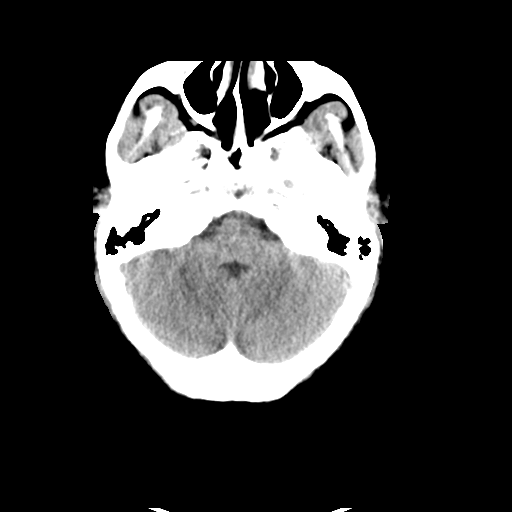
[im 14/37  brain]
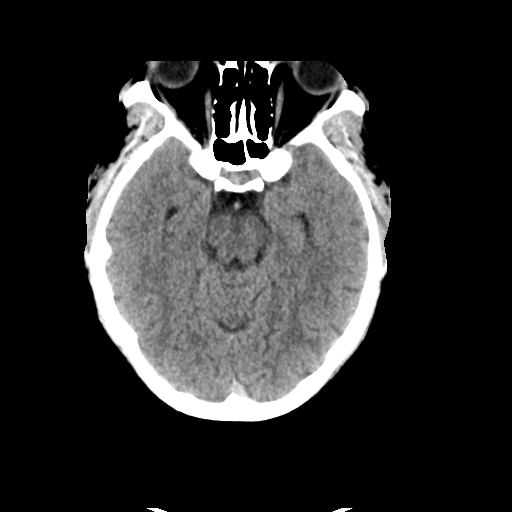
[im 19/37  brain]
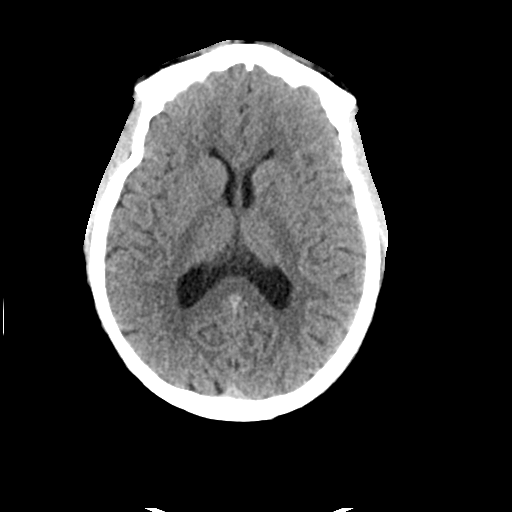
[im 23/37  brain]
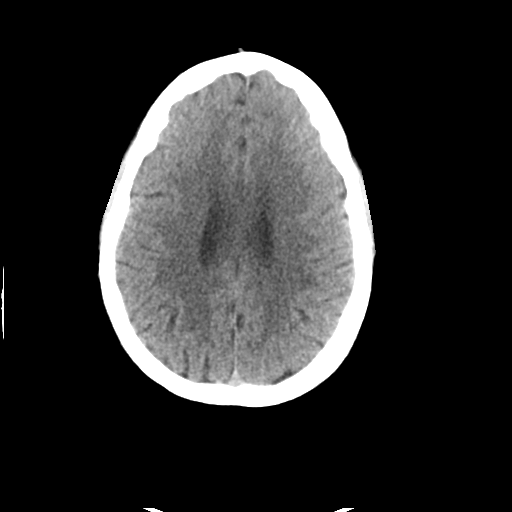
[im 23/37  bone]
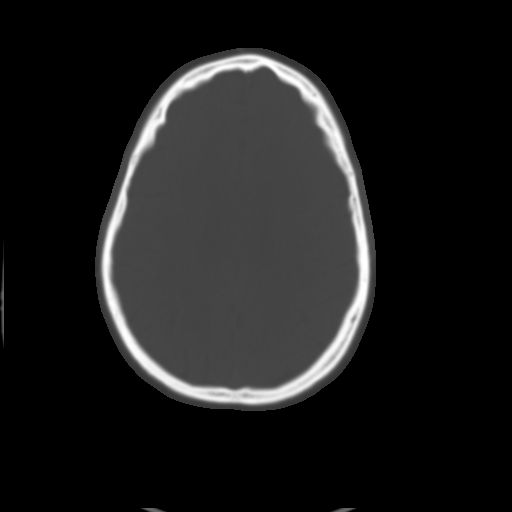
[im 28/37  brain]
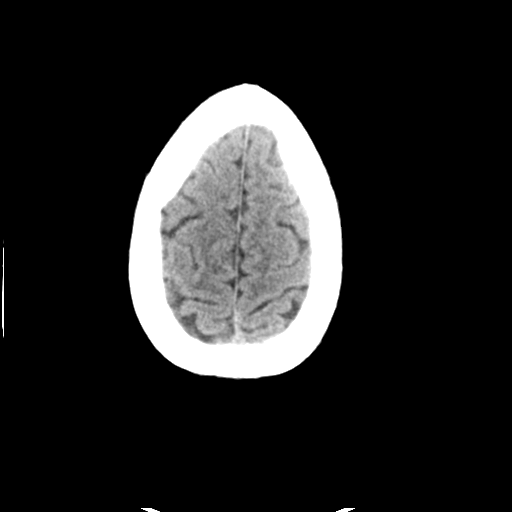
[im 32/37  brain]
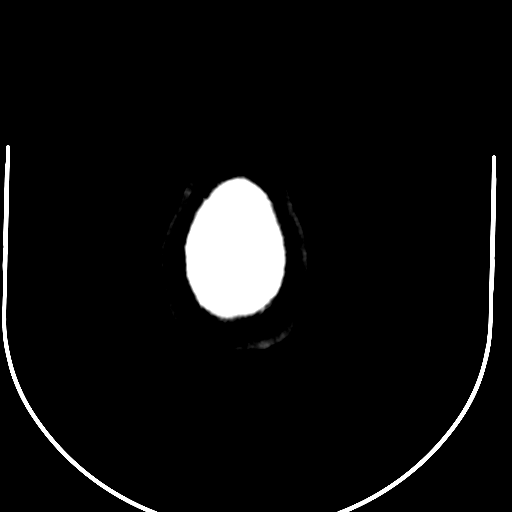

[Series 5: head without cor · coronal · non-contrast · 0.36mm/px · 3 of 74 slices shown]
[im 25/74  brain]
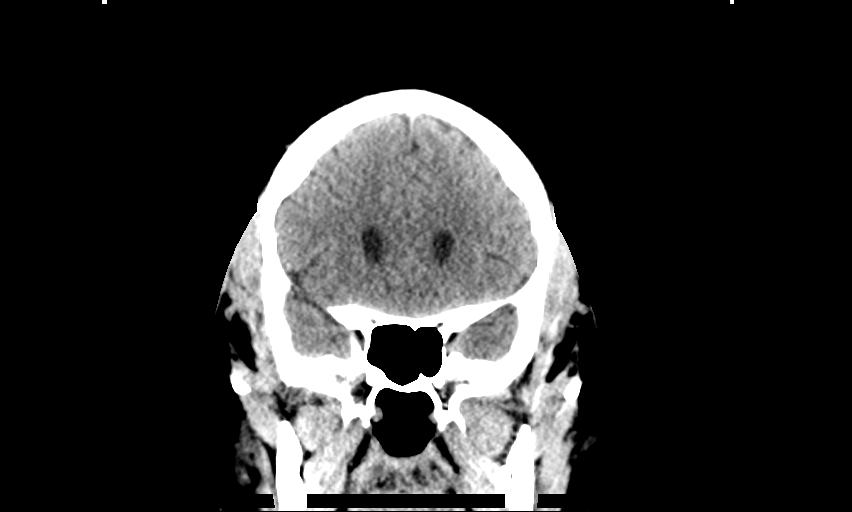
[im 33/74  brain]
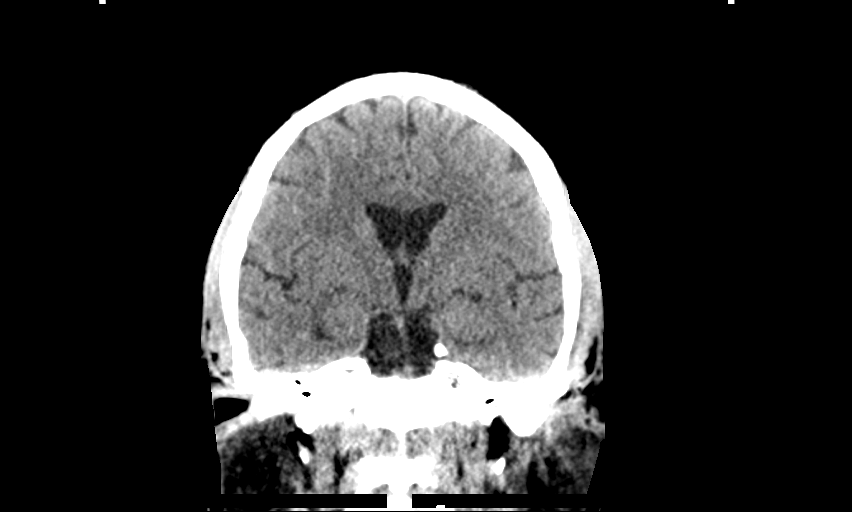
[im 41/74  brain]
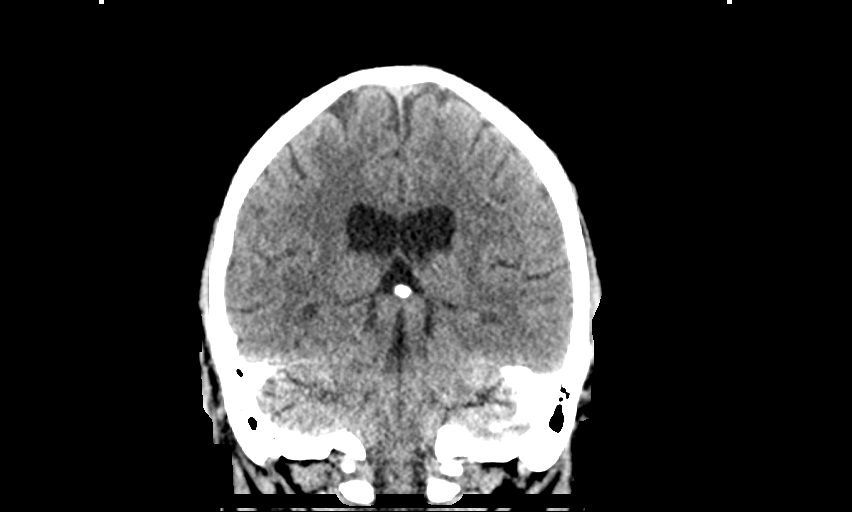

[Series 6: head without sag · sagittal · non-contrast · 0.36mm/px · 3 of 67 slices shown]
[im 23/67  brain]
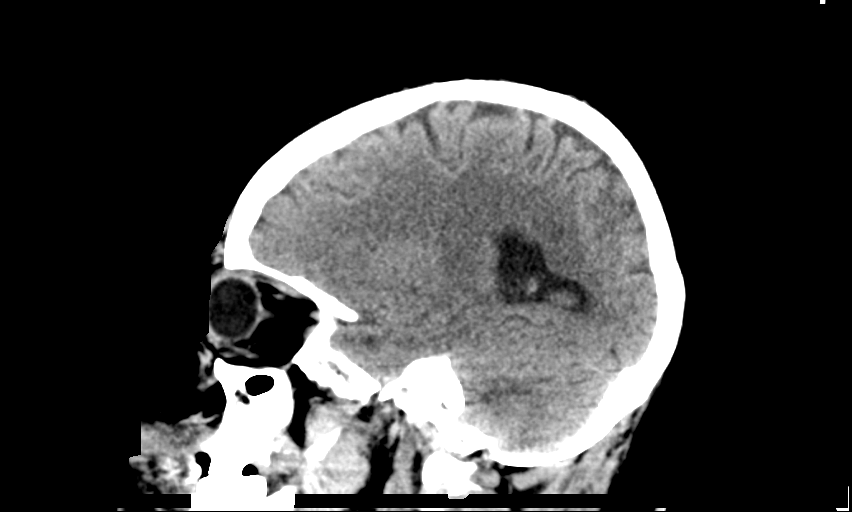
[im 34/67  brain]
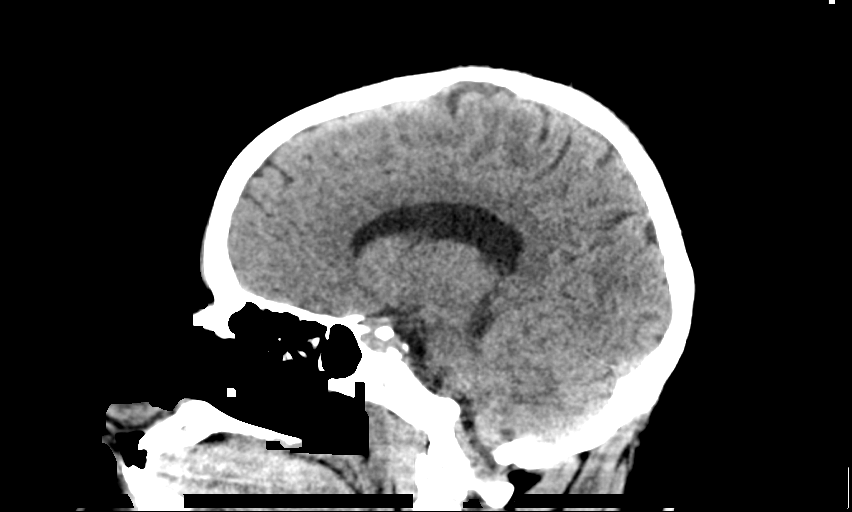
[im 45/67  brain]
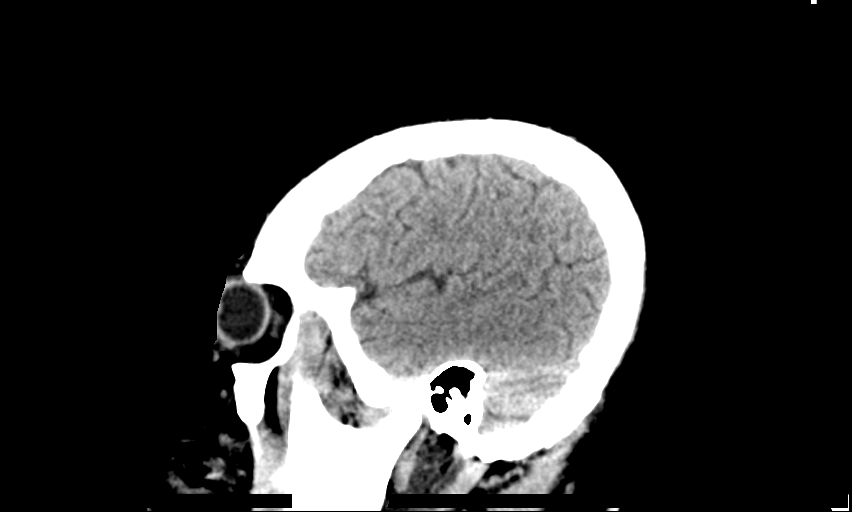

[13 of 47 positions shown; findings below may reference images not displayed]

FINDINGS: CT HEAD FINDINGS

Brain: No evidence of acute infarction, hemorrhage, hydrocephalus,
extra-axial collection or mass lesion/mass effect.

Vascular: No hyperdense vessel or unexpected calcification.

Skull: Normal. Negative for fracture or focal lesion.

Sinuses/Orbits: No acute finding.

Other: None.

CT CERVICAL SPINE FINDINGS

Alignment: Normal.

Skull base and vertebrae: No acute fracture. No primary bone lesion
or focal pathologic process.

Soft tissues and spinal canal: No prevertebral fluid or swelling. No
visible canal hematoma.

Disc levels:  Intact.

Upper chest: Negative.

Other: None.
IMPRESSION: 1.  No acute intracranial pathology.

2.  No fracture or static subluxation of the cervical spine.

## 2019-08-06 IMAGING — CR THORACIC SPINE 2 VIEWS
2 series · 2 of 2 positions shown · non-contrast
Comparison: None.

CLINICAL DATA: Pt to ED for seizures. Per EMS, pt is having chest
and abdominal pain. Pt completley non-responsive. Best obtainable
imaging due to pt's condition

EXAM:
THORACIC SPINE 2 VIEWS

[t-spine ap]
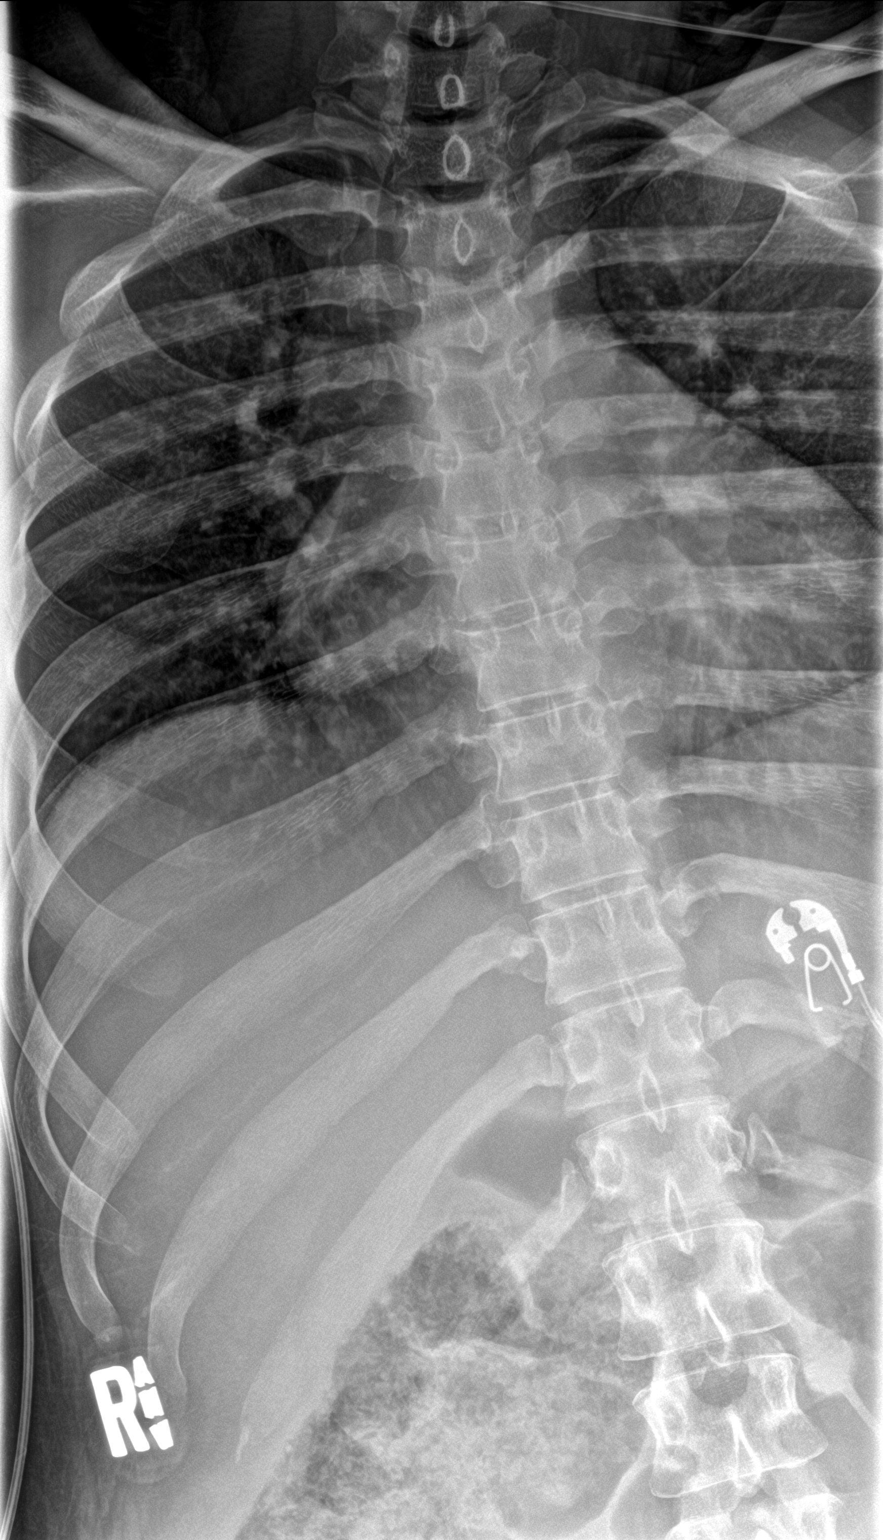

[t-spine lat]
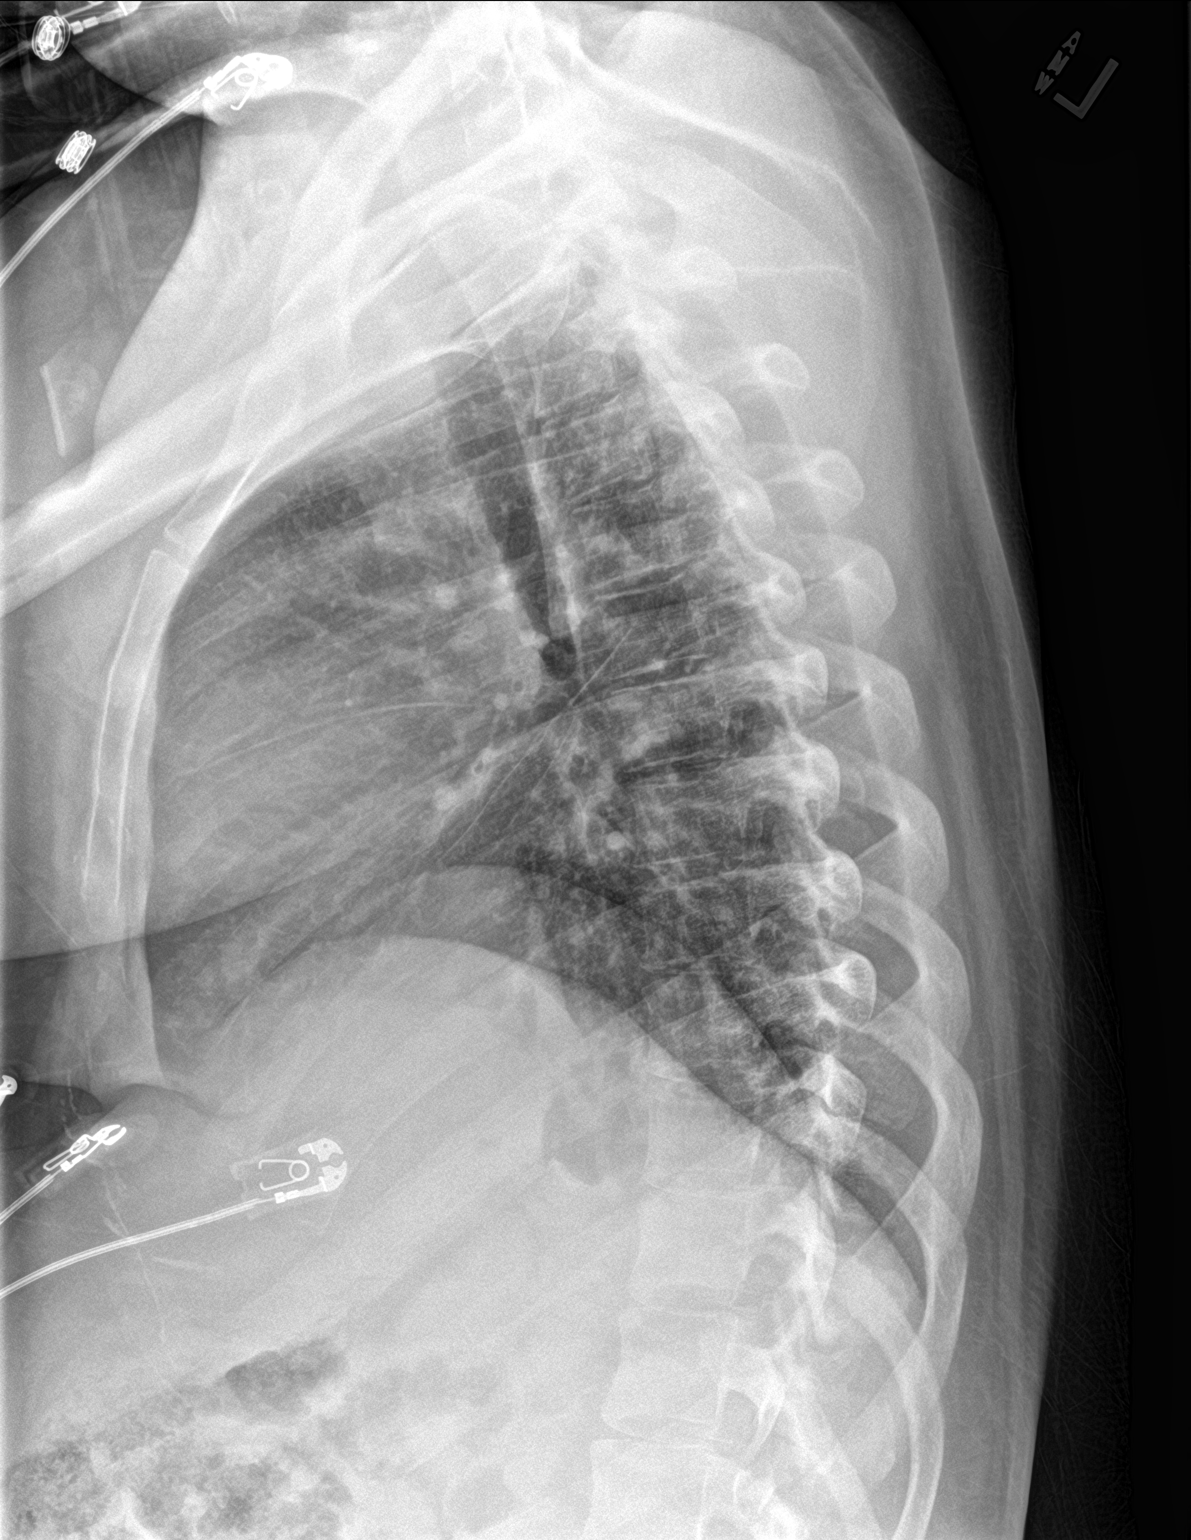

[2 of 2 positions shown; findings below may reference images not displayed]

FINDINGS: There is no evidence of thoracic spine fracture. Alignment is
normal. No other significant bone abnormalities are identified.
IMPRESSION: Negative.

## 2019-08-06 MED ORDER — INSULIN ASPART 100 UNIT/ML ~~LOC~~ SOLN
10.0000 [IU] | Freq: Three times a day (TID) | SUBCUTANEOUS | Status: DC
  Administered 2019-08-07 – 2019-08-09 (×7): 10 [IU] via SUBCUTANEOUS
  Filled 2019-08-06: qty 0.1

## 2019-08-06 MED ORDER — PRUCALOPRIDE SUCCINATE 2 MG PO TABS: 2.0000 mg | ORAL_TABLET | Freq: Every day | ORAL | Status: DC

## 2019-08-06 MED ORDER — DEXTROSE 50 % IV SOLN: 0.0000 mL | INTRAVENOUS | Status: DC | PRN

## 2019-08-06 MED ORDER — SUCRALFATE 1 GM/10ML PO SUSP
1.0000 g | Freq: Three times a day (TID) | ORAL | Status: DC
  Administered 2019-08-06 – 2019-08-09 (×6): 1 g via ORAL
  Filled 2019-08-06 (×7): qty 10

## 2019-08-06 MED ORDER — PROMETHAZINE HCL 25 MG/ML IJ SOLN
6.2500 mg | Freq: Four times a day (QID) | INTRAMUSCULAR | Status: DC | PRN
  Administered 2019-08-06 – 2019-08-08 (×3): 6.25 mg via INTRAVENOUS
  Filled 2019-08-06 (×4): qty 1

## 2019-08-06 MED ORDER — POTASSIUM CHLORIDE 10 MEQ/100ML IV SOLN
10.0000 meq | INTRAVENOUS | Status: AC
  Administered 2019-08-06 (×2): 10 meq via INTRAVENOUS
  Filled 2019-08-06 (×2): qty 100

## 2019-08-06 MED ORDER — METOCLOPRAMIDE HCL 5 MG/ML IJ SOLN
10.0000 mg | Freq: Once | INTRAMUSCULAR | Status: AC
  Administered 2019-08-06: 10 mg via INTRAVENOUS
  Filled 2019-08-06: qty 2

## 2019-08-06 MED ORDER — QUETIAPINE FUMARATE 50 MG PO TABS
300.0000 mg | ORAL_TABLET | Freq: Every day | ORAL | Status: DC
  Administered 2019-08-06 – 2019-08-08 (×3): 300 mg via ORAL
  Filled 2019-08-06 (×2): qty 6
  Filled 2019-08-06: qty 1

## 2019-08-06 MED ORDER — INSULIN ASPART 100 UNIT/ML ~~LOC~~ SOLN
0.0000 [IU] | Freq: Every day | SUBCUTANEOUS | Status: DC
  Filled 2019-08-06: qty 0.05

## 2019-08-06 MED ORDER — TOPIRAMATE 25 MG PO TABS
25.0000 mg | ORAL_TABLET | Freq: Two times a day (BID) | ORAL | Status: DC
  Administered 2019-08-06 – 2019-08-09 (×6): 25 mg via ORAL
  Filled 2019-08-06 (×6): qty 1

## 2019-08-06 MED ORDER — PROMETHAZINE HCL 25 MG/ML IJ SOLN
25.0000 mg | Freq: Once | INTRAMUSCULAR | Status: AC
  Administered 2019-08-06: 25 mg via INTRAVENOUS
  Filled 2019-08-06: qty 1

## 2019-08-06 MED ORDER — DEXTROSE 50 % IV SOLN
0.0000 mL | INTRAVENOUS | Status: DC | PRN
Start: 2019-08-06 — End: 2019-08-06

## 2019-08-06 MED ORDER — INSULIN REGULAR(HUMAN) IN NACL 100-0.9 UT/100ML-% IV SOLN
INTRAVENOUS | Status: DC
Start: 2019-08-06 — End: 2019-08-06

## 2019-08-06 MED ORDER — PANTOPRAZOLE SODIUM 40 MG IV SOLR
40.0000 mg | Freq: Once | INTRAVENOUS | Status: AC
  Administered 2019-08-06: 40 mg via INTRAVENOUS
  Filled 2019-08-06: qty 40

## 2019-08-06 MED ORDER — BUSPIRONE HCL 5 MG PO TABS
10.0000 mg | ORAL_TABLET | Freq: Three times a day (TID) | ORAL | Status: DC
  Administered 2019-08-06 – 2019-08-09 (×7): 10 mg via ORAL
  Filled 2019-08-06 (×2): qty 1
  Filled 2019-08-06 (×6): qty 2

## 2019-08-06 MED ORDER — INSULIN ASPART 100 UNIT/ML ~~LOC~~ SOLN
0.0000 [IU] | Freq: Three times a day (TID) | SUBCUTANEOUS | Status: DC
  Administered 2019-08-07: 2 [IU] via SUBCUTANEOUS
  Administered 2019-08-07 – 2019-08-08 (×4): 3 [IU] via SUBCUTANEOUS
  Administered 2019-08-08: 19:00:00 5 [IU] via SUBCUTANEOUS
  Filled 2019-08-06: qty 0.15

## 2019-08-06 MED ORDER — DEXTROSE-NACL 5-0.45 % IV SOLN: INTRAVENOUS | Status: DC

## 2019-08-06 MED ORDER — DICYCLOMINE HCL 10 MG PO CAPS
10.0000 mg | ORAL_CAPSULE | Freq: Three times a day (TID) | ORAL | Status: DC | PRN
  Administered 2019-08-06 – 2019-08-08 (×3): 10 mg via ORAL
  Filled 2019-08-06 (×3): qty 1

## 2019-08-06 MED ORDER — METOCLOPRAMIDE HCL 5 MG/ML IJ SOLN
10.0000 mg | Freq: Four times a day (QID) | INTRAMUSCULAR | Status: DC
  Administered 2019-08-06 – 2019-08-09 (×9): 10 mg via INTRAVENOUS
  Filled 2019-08-06 (×9): qty 2

## 2019-08-06 MED ORDER — SODIUM CHLORIDE 0.9 % IV BOLUS
1000.0000 mL | INTRAVENOUS | Status: AC
  Administered 2019-08-06 (×2): 1000 mL via INTRAVENOUS

## 2019-08-06 MED ORDER — METHOCARBAMOL 1000 MG/10ML IJ SOLN
500.0000 mg | Freq: Once | INTRAVENOUS | Status: AC
  Administered 2019-08-06: 500 mg via INTRAVENOUS
  Filled 2019-08-06: qty 500

## 2019-08-06 MED ORDER — PANTOPRAZOLE SODIUM 40 MG IV SOLR
40.0000 mg | Freq: Two times a day (BID) | INTRAVENOUS | Status: DC
  Administered 2019-08-06 – 2019-08-09 (×6): 40 mg via INTRAVENOUS
  Filled 2019-08-06 (×6): qty 40

## 2019-08-06 MED ORDER — SODIUM CHLORIDE 0.9 % IV BOLUS
1000.0000 mL | Freq: Once | INTRAVENOUS | Status: AC
  Administered 2019-08-06: 1000 mL via INTRAVENOUS

## 2019-08-06 MED ORDER — ATORVASTATIN CALCIUM 20 MG PO TABS
20.0000 mg | ORAL_TABLET | Freq: Every day | ORAL | Status: DC
  Administered 2019-08-08: 20 mg via ORAL
  Filled 2019-08-06 (×2): qty 1

## 2019-08-06 MED ORDER — CYCLOSPORINE 0.05 % OP EMUL
1.0000 [drp] | Freq: Two times a day (BID) | OPHTHALMIC | Status: DC
  Administered 2019-08-07 – 2019-08-08 (×2): 1 [drp] via OPHTHALMIC
  Filled 2019-08-06 (×6): qty 1

## 2019-08-06 MED ORDER — PROPRANOLOL HCL 20 MG PO TABS
20.0000 mg | ORAL_TABLET | Freq: Two times a day (BID) | ORAL | Status: DC
  Administered 2019-08-06 – 2019-08-09 (×6): 20 mg via ORAL
  Filled 2019-08-06 (×7): qty 1

## 2019-08-06 MED ORDER — SODIUM CHLORIDE 0.9 % IV SOLN: INTRAVENOUS | Status: DC

## 2019-08-06 MED ORDER — ONDANSETRON HCL 4 MG/2ML IJ SOLN
4.0000 mg | Freq: Four times a day (QID) | INTRAMUSCULAR | Status: DC | PRN
  Administered 2019-08-07 – 2019-08-08 (×2): 4 mg via INTRAVENOUS
  Filled 2019-08-06 (×2): qty 2

## 2019-08-06 MED ORDER — PANTOPRAZOLE SODIUM 40 MG IV SOLR: 40.0000 mg | Freq: Two times a day (BID) | INTRAVENOUS | Status: DC

## 2019-08-06 MED ORDER — INSULIN REGULAR(HUMAN) IN NACL 100-0.9 UT/100ML-% IV SOLN
INTRAVENOUS | Status: AC
  Administered 2019-08-06: 13 [IU]/h via INTRAVENOUS
  Filled 2019-08-06: qty 100

## 2019-08-06 MED ORDER — ONDANSETRON HCL 4 MG/2ML IJ SOLN
4.0000 mg | Freq: Once | INTRAMUSCULAR | Status: DC
  Filled 2019-08-06: qty 2

## 2019-08-06 MED ORDER — HYDROXYZINE HCL 25 MG PO TABS: 50.0000 mg | ORAL_TABLET | Freq: Two times a day (BID) | ORAL | Status: DC | PRN

## 2019-08-06 MED ORDER — HALOPERIDOL LACTATE 5 MG/ML IJ SOLN
5.0000 mg | Freq: Once | INTRAMUSCULAR | Status: AC
  Administered 2019-08-06: 5 mg via INTRAVENOUS
  Filled 2019-08-06: qty 1

## 2019-08-06 MED ORDER — MIRTAZAPINE 15 MG PO TABS
7.5000 mg | ORAL_TABLET | Freq: Every day | ORAL | Status: DC
  Administered 2019-08-06 – 2019-08-08 (×3): 7.5 mg via ORAL
  Filled 2019-08-06 (×3): qty 1

## 2019-08-06 MED ORDER — LAMOTRIGINE 25 MG PO TABS
50.0000 mg | ORAL_TABLET | Freq: Two times a day (BID) | ORAL | Status: DC
  Administered 2019-08-06 – 2019-08-09 (×6): 50 mg via ORAL
  Filled 2019-08-06 (×6): qty 2

## 2019-08-06 MED ORDER — INSULIN GLARGINE 100 UNIT/ML ~~LOC~~ SOLN
50.0000 [IU] | Freq: Every day | SUBCUTANEOUS | Status: DC
  Administered 2019-08-06 – 2019-08-08 (×2): 50 [IU] via SUBCUTANEOUS
  Filled 2019-08-06 (×3): qty 0.5

## 2019-08-06 MED ORDER — MORPHINE SULFATE (PF) 4 MG/ML IV SOLN
4.0000 mg | Freq: Once | INTRAVENOUS | Status: AC
  Administered 2019-08-06: 4 mg via INTRAVENOUS
  Filled 2019-08-06: qty 1

## 2019-08-06 NOTE — ED Triage Notes (Addendum)
Pt from home via EMS c/o seizures and hyperglycemia. Pt reports that she was seen for the same at this facility yesterday. Pt mother called 911 after pt had a seizure and emesis this morning. Pt adds that she also has abd pain. Pt is A&O x4

## 2019-08-06 NOTE — ED Notes (Signed)
We have had some difficulty obtaining IV access. Our C.N., Darl Pikes is starting u/s-guided IV as I write this.

## 2019-08-06 NOTE — H&P (Signed)
History and Physical    [name]:[id number] DOB: 31-Oct-2000 DOA: 08/06/2019  PCP: Fleet Contras, MD   Patient coming from:Home   Chief complaint:Nausea vomiting   HPI: Nancy [name] is a 19 y.o. 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  Patient was seen in the ED yesterday with abdominal pain nausea vomiting and was discharged home.  After within an hour returning as her symptoms return at home with persistent nausea vomiting/?  Blood in vomitus several cups of emesis no streaks.  Her mom also noticed seizure-like activity with shaking and tremors at least 20 separate episodes throughout the day.  Patient is unable to take her medication or hydrate herself so came to the ER.  She did not take her insulin yesterday, she reports seeing Dr. Adela Lank from GI for recurrent nausea and vomiting and had gastric emptying test in the past.  ED Course: Hypertensive tachycardic up to 140s, saturating well on room air.  Blood work shows pH 7.3, anion gap 22 with blood sugar 325, bicarb 18, normal LFTs and lipase, elevated beta hydroxybutyric acid at 4.2, abnormal UA with ketones 80, blood sugar more than 500, positive for opiates and THC.  Patient was given 2 L bolus normal saline and also received Reglan 10 mg, Haldol 5 mg, morphine 4 mg, Protonix 40 mg, Phenergan 25 mg symptoms persisted and admission was requested for management of DKA. Currently in ED she is resting well. She reports she vomited 3x in ED but at home was vomiting every 15 mins. She feels some better,HR is down in 100-103. Has diffuse abdomen pain but soft belly. Not in distress.  Review of Systems: All systems were reviewed and were negative except as mentioned in HPI above. Negative for fever Negative for  chest pain Negative for shortness of breath  Past Medical History:  Diagnosis Date  . Acanthosis nigricans   . Anxiety   . CHF (congestive heart failure) (HCC)   . Chronic lower back pain   . Depression   . DKA, type 1 (HCC) 09/13/2018  . Dyspepsia   . Obesity   . Ovarian cyst    pt is not aware of this hx (11/24/2017)  . 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 Premature baby   . Seizures (HCC)   . Type II diabetes mellitus (HCC)    insulin dependant    Past Surgical History:  Procedure Laterality Date  . ABDOMINAL HERNIA REPAIR     "I was a baby"  . BIOPSY  10/12/2018   Procedure: BIOPSY;  Surgeon: Lynann Bologna, MD;  Location: [facility] ENDOSCOPY;  Service: Endoscopy;;  . ESOPHAGOGASTRODUODENOSCOPY (EGD) WITH PROPOFOL N/A 10/12/2018   Procedure: ESOPHAGOGASTRODUODENOSCOPY (EGD) WITH PROPOFOL;  Surgeon: Lynann Bologna, MD;  Location: [facility] ENDOSCOPY;  Service: Endoscopy;  Laterality: N/A;  . HERNIA REPAIR    . LEFT HEART CATH AND CORONARY ANGIOGRAPHY N/A 10/13/2018   Procedure: LEFT HEART CATH AND CORONARY ANGIOGRAPHY;  Surgeon: Kathleene Hazel, MD;  Location: [facility] INVASIVE CV LAB;  Service: Cardiovascular;  Laterality: N/A;  . TONSILLECTOMY AND ADENOIDECTOMY    . WISDOM TOOTH EXTRACTION  2017     reports that she has never smoked. She has never used smokeless tobacco. She reports that she does not drink alcohol and does not use drugs.  Allergies  Allergen Reactions  . Ibuprofen Other (See Comments)  GI MD said to not take this anymore  . Oatmeal Rash    Family History  Problem Relation Age of Onset  . Diabetes Mother   . Hypertension Mother   . Obesity Mother   . Asthma Mother   . Allergic rhinitis Mother   . Eczema Mother   . Cervical cancer Mother   . Diabetes Father   . Hypertension Father   . Obesity Father   . Hyperlipidemia Father   . Hypertension Paternal Aunt   . Hypertension Maternal Grandfather   . Colon cancer Maternal Grandfather   . Diabetes Paternal  Grandmother   . Obesity Paternal Grandmother   . Diabetes Paternal Grandfather   . Obesity Paternal Grandfather   . Angioedema Neg Hx   . Immunodeficiency Neg Hx   . Urticaria Neg Hx   . Stomach cancer Neg Hx   . Esophageal cancer Neg Hx      Prior to Admission medications   Medication Sig Start Date End Date Taking? Authorizing Provider  atorvastatin (LIPITOR) 20 MG tablet Take 1 tablet (20 mg total) by mouth daily at 6 PM. 10/05/18   Storm Frisk, MD  bisacodyl (DULCOLAX) 10 MG suppository Place 1 suppository (10 mg total) rectally daily. Patient taking differently: Place 10 mg rectally daily as needed for mild constipation.  12/24/18   Swayze, Ava, DO  busPIRone (BUSPAR) 10 MG tablet Take 10 mg by mouth 3 (three) times daily.  11/19/18   [provider]  [place]way Oil-Levomenthol (FDGARD) 25-20.75 MG CAPS Take 2 capsules by mouth as needed. Take as directed as needed Patient not taking: Reported on 08/05/2019 03/21/19   Benancio Deeds, MD  cycloSPORINE (RESTASIS) 0.05 % ophthalmic emulsion Place 1 drop into both eyes 2 (two) times daily.    [provider]  dicyclomine (BENTYL) 10 MG capsule Take 10 mg by mouth 3 (three) times daily as needed for spasms.  07/05/19   [provider]  hydrOXYzine (ATARAX/VISTARIL) 50 MG tablet Take 1 tablet (50 mg total) by mouth 3 (three) times daily as needed for anxiety. Patient taking differently: Take 50 mg by mouth in the morning and at bedtime.  11/11/18   Armandina Stammer I, NP  insulin aspart (NOVOLOG FLEXPEN) 100 UNIT/ML FlexPen Inject 15 Units into the skin 3 (three) times daily with meals.    [provider]  insulin glargine (LANTUS) 100 UNIT/ML injection Inject 0.6 mLs (60 Units total) into the skin at bedtime. For diabetes management Patient not taking: Reported on 08/05/2019 11/11/18   Armandina Stammer I, NP  lamoTRIgine (LAMICTAL) 25 MG tablet Take 2 tablets (50 mg total) by mouth 2 (two) times daily. For  mood stabilization 11/11/18   Nwoko, Nicole Kindred I, NP  LANTUS SOLOSTAR 100 UNIT/ML Solostar Pen Inject 70 Units into the skin at bedtime.  07/05/19   [provider]  lubiprostone (AMITIZA) 24 MCG capsule Take 1 capsule (24 mcg total) by mouth 2 (two) times daily with a meal. 10/27/18   Esterwood, Amy S, PA-C  mirtazapine (REMERON) 7.5 MG tablet Take 1 tablet (7.5 mg total) by mouth at bedtime. For depression/sleep 11/11/18   Armandina Stammer I, NP  Multiple Vitamin (MULTIVITAMIN WITH MINERALS) TABS tablet Take 1 tablet by mouth daily. 09/18/18   Clapacs, Jackquline Denmark, MD  Olopatadine HCl (PAZEO) 0.7 % SOLN Place 2 drops into both eyes 2 (two) times daily.     [provider]  pantoprazole (PROTONIX) 40 MG tablet Take 1 tablet (40  mg total) by mouth 2 (two) times daily. For acid reflux 01/29/19   Jae Dire, MD  polyethylene glycol (MIRALAX / GLYCOLAX) 17 g packet Take 17 g by mouth daily. Patient taking differently: Take 17 g by mouth daily as needed for moderate constipation.  12/24/18   Swayze, Ava, DO  prochlorperazine (COMPAZINE) 25 MG suppository Place 1 suppository (25 mg total) rectally every 12 (twelve) hours as needed for nausea or vomiting. 12/05/18   Melene Plan, DO  promethazine (PHENERGAN) 25 MG suppository Place 25 mg rectally every 8 (eight) hours as needed for nausea or vomiting.    [provider]  propranolol (INDERAL) 20 MG tablet Take 1 tablet (20 mg total) by mouth 2 (two) times daily. For anxiety/HTN 12/23/18   Swayze, Ava, DO  Prucalopride Succinate (MOTEGRITY) 2 MG TABS Take 1 tablet (2 mg total) by mouth daily. 03/21/19   Armbruster, Willaim Rayas, MD  QUEtiapine (SEROQUEL) 100 MG tablet Take 300 mg by mouth at bedtime. 07/05/19   [provider]  QUEtiapine (SEROQUEL) 200 MG tablet Take 2 tablets (400 mg total) by mouth at bedtime. Patient not taking: Reported on 08/05/2019 01/15/19   Gwyneth Sprout, MD  sucralfate (CARAFATE) 1 GM/10ML suspension Take 1 g by  mouth 3 (three) times daily. 07/05/19   [provider]  topiramate (TOPAMAX) 25 MG tablet Take 1 tablet (25 mg total) by mouth 2 (two) times daily. For seizure activities 11/11/18   Armandina Stammer I, NP    Physical Exam: Vitals:   08/06/19 1254 08/06/19 1314 08/06/19 1448 08/06/19 1448  BP:  (!) 140/91 (!) 146/81   Pulse: (!) 116 (!) 111  (!) 108  Resp: 21 (!) 26  20  Temp:      TempSrc:      SpO2: 100% 100%  100%    General exam: AAOx3 , NAD, weak appearing. HEENT:Oral mucosa moist, Ear/Nose WNL grossly, dentition normal. Respiratory system: bilaterally clear,no wheezing or crackles,no use of accessory muscle Cardiovascular system: S1 & S2 +, No JVD,. Gastrointestinal system: Abdomen soft, mild diffuse tenderne4ss,ND, BS+ Nervous System:Alert, awake, moving extremities and grossly nonfocal Extremities: No edema, distal peripheral pulses palpable.  Skin: No rashes,no icterus. MSK: Normal muscle bulk,tone, power   Labs on Admission: I have personally reviewed following labs and imaging studies  CBC: Recent Labs  Lab 08/05/19 1430 08/05/19 1742 08/06/19 1220  WBC 12.0*  --  10.3  NEUTROABS  --   --  8.0*  HGB 13.4 12.3 13.8  HCT 43.4 39.1 43.5  MCV 81.1  --  80.6  PLT 313  --  359   Basic Metabolic Panel: Recent Labs  Lab 08/05/19 1430 08/06/19 1220  NA 136 138  K 4.1 4.2  CL 99 98  CO2 23 18*  GLUCOSE 380* 325*  BUN 11 11  CREATININE 0.57 0.61  CALCIUM 9.5 9.7   GFR: Estimated Creatinine Clearance: 123.6 mL/min (by C-G formula based on SCr of 0.61 mg/dL). Liver Function Tests: Recent Labs  Lab 08/05/19 1430 08/06/19 1220  AST 14* 17  ALT 16 14  ALKPHOS 98 103  BILITOT 0.5 1.2  PROT 8.1 8.4*  ALBUMIN 4.7 4.7   Recent Labs  Lab 08/06/19 1220  LIPASE 22   No results for input(s): AMMONIA in the last 168 hours. Coagulation Profile: No results for input(s): INR, PROTIME in the last 168 hours. Cardiac Enzymes: No results for input(s):  CKTOTAL, CKMB, CKMBINDEX, TROPONINI in the last 168 hours. BNP (last  3 results) No results for input(s): PROBNP in the last 8760 hours. HbA1C: No results for input(s): HGBA1C in the last 72 hours. CBG: Recent Labs  Lab 08/05/19 1816 08/06/19 1132 08/06/19 1429 08/06/19 1534  GLUCAP 99 364* 316* 178*   Lipid Profile: No results for input(s): CHOL, HDL, LDLCALC, TRIG, CHOLHDL, LDLDIRECT in the last 72 hours. Thyroid Function Tests: No results for input(s): TSH, T4TOTAL, FREET4, T3FREE, THYROIDAB in the last 72 hours. Anemia Panel: No results for input(s): VITAMINB12, FOLATE, FERRITIN, TIBC, IRON, RETICCTPCT in the last 72 hours. Urine analysis:    Component Value Date/Time   COLORURINE YELLOW 08/06/2019 1413   APPEARANCEUR CLEAR 08/06/2019 1413   LABSPEC 1.035 (H) 08/06/2019 1413   PHURINE 5.0 08/06/2019 1413   GLUCOSEU >=500 (A) 08/06/2019 1413   HGBUR NEGATIVE 08/06/2019 1413   BILIRUBINUR NEGATIVE 08/06/2019 1413   BILIRUBINUR negative 07/13/2018 1651   KETONESUR 80 (A) 08/06/2019 1413   PROTEINUR NEGATIVE 08/06/2019 1413   UROBILINOGEN 0.2 07/13/2018 1651   UROBILINOGEN 1.0 07/09/2017 1324   NITRITE NEGATIVE 08/06/2019 1413   LEUKOCYTESUR NEGATIVE 08/06/2019 1413    Radiological Exams on Admission: No results found.   Assessment/Plan  DKA with poorly controlled insulin-dependent diabetes triggered by patient's intractable nausea and vomiting.  Did not take insulin last night.  Patient has been started on insulin drip will continue same month serial BMP and every hour Accu-Chek as per endocrine and DKA protocol with iv fluid hydration, once anion gap closes, start diet and and switch to long-acting insulin home regimen along with sliding scale/Premeal insulin after making 2-hour overlap with the IV -sq insulin.  Intractable nausea and vomiting has had similar prior presentation and was seen by Dr. Clydene Pugh from GI and had gastric emptying study.  CT preliminary.   Repeat gastric emptying study at some point and patient was outpatient.  We will keep on as needed Zofran, Phenergan for hyperkalemia and a scheduled Reglan, IV Protonix Q12H. Patient reports blood in the vomitus and noted that she has had similar previous episodes. We will avoid Lovenox/heparin- if she has recurrent issues will consult GI, check H&H serially.  Dehydration DKA with tachycardia.Continue IV hydration aggressively as above.  Type 1 diabetes mellitus with ketoacidosis without coma.patient takes 70 units Lantus at bedtime and 15 units Humalog premeal 3 times a day.  last hba1c was 13.4 in February/21. For now cont the patient on insulin drip with DKA protocol as #1.  Tremor/shaking episodes multiple times as none sedation/versus seizure.  Has had similar previous presentation.  We will resume her home Lamictal/Topamax.  Anxiety/depression-resume home meds  Noncompliance/positive for THC and opiates in the urine.   Morbid obesity with BMI 38, will benefit with weight loss and healthy lifestyle.  Hypertension: Blood pressure on higher side resume home meds  There is no height or weight on file to calculate BMI.   Severity of Illness: * I certify that at the point of admission it is my clinical judgment that the patient will require inpatient hospital care spanning less than 2 midnights from the point of admission due to high intensity of service, high risk for further deterioration and high frequency of surveillance required.*    DVT prophylaxis: SCDs Start: 08/06/19 1503 Code Status:   Code Status: Full Code  Family Communication: Admission, patients condition and plan of care including tests being ordered have been discussed with the patient  who indicate understanding and agree with the plan and Code Status.  Consults called:  Lanae Boast MD [facility]  If 7PM-7AM, please contact night-coverage [email or web address]  08/06/2019, 4:08 PM

## 2019-08-06 NOTE — ED Provider Notes (Addendum)
[facility]-EMERGENCY DEPT Provider Note   CSN: [id number] Arrival date & time: 08/06/19  1113     History Chief Complaint  Patient presents with  . Seizures    Nancy [name] is a 19 y.o. female.  The history is provided by the patient and medical records. No language interpreter was used.  Seizures    19 year old female with history of seizure currently on Lamictal and Topamax, insulin-dependent diabetes, CHF, anxiety, conversion disorder with pseudoseizures brought here via EMS from home for evaluation of seizure-like activity.  Patient states she was recently seen in the ED yesterday for complaints of abdominal pain with exertion nausea vomiting diarrhea.  She mention receiving medication to help her with the symptoms and subsequently at discharge felt better.  However within about an hour after being discharged and when she got home, symptoms returned.  She endorsed persistent nausea, has vomited copious amount of emesis and now reported vomiting of blood.  She quantifies as several cups of bloody emesis and not streaks.  She endorsed having loose stools, reports diffuse abdominal discomfort which she described as like a cramping sensation has been persistent.  She mention her mom has noticed her having seizure-like activities was shaking and tremors at least 20 separate episodes throughout the day.  She tries taking the medication and try to hydrate herself without relief prompting this ER visit.  Patient mention she is aware that her blood sugar is elevated because she did not take her insulin yesterday.  She also denies alcohol use or marijuana use.  Patient states she does follow-up with a GI specialist, Dr. Lanetta Inch and has had numerous test performed without any definitive diagnosis of her recurrent nausea and vomiting.  She mention she does not think she has gastroparesis as she has undergone several gastric emptying test that was normal.  She denies any  recent antibiotic use.  Patient mention she has been on multiple antinausea medication including Protonix, Phenergan, Reglan, Zofran, and another suppository.  She mentions symptoms has not helped her much.  Past Medical History:  Diagnosis Date  . Acanthosis nigricans   . Anxiety   . CHF (congestive heart failure) (HCC)   . Chronic lower back pain   . Depression   . DKA, type 1 (HCC) 09/13/2018  . Dyspepsia   . Obesity   . Ovarian cyst    pt is not aware of this hx (11/24/2017)  . 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 Premature baby   . Seizures (HCC)   . Type II diabetes mellitus (HCC)    insulin dependant    Patient Active Problem List   Diagnosis Date Noted  . Cellulitis   . DKA (diabetic ketoacidoses) (HCC) 01/27/2019  . Leukocytosis 12/19/2018  . Dehydration   . Pseudoseizures   . Type 1 diabetes mellitus with ketoacidosis without coma (HCC) 11/28/2018  . Hyperglycemia due to diabetes mellitus (HCC) 11/27/2018  . 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  . MDD (major depressive disorder), recurrent episode, severe (HCC) 11/06/2018  . Nonspecific abnormal electrocardiogram (ECG) (EKG)   . Chest pain of uncertain etiology   . Hypertensive urgency 10/08/2018  . Conversion disorder with attacks or seizures, acute episode, with psychological stressor 09/16/2018  . MDD (major depressive disorder), recurrent severe, without psychosis (HCC)   . AKI (acute kidney injury) (HCC) 08/04/2018  . Seizures (HCC) 08/03/2018  . Depression 07/25/2018  . Syncope 01/30/2018  . Orthostatic hypotension 01/24/2018  . Tachycardia 12/28/2017  .  Chronic abdominal pain 12/24/2017  . Chest pain 12/19/2017  . Nausea and vomiting 08/21/2017  . Generalized abdominal pain 08/21/2017  . Non compliance with medical treatment 01/27/2012  . Adjustment disorder 09/16/2011  . Acanthosis nigricans   . Goiter   . Obesity 06/14/2010  . Hypertension 06/14/2010    Past  Surgical History:  Procedure Laterality Date  . ABDOMINAL HERNIA REPAIR     "I was a baby"  . BIOPSY  10/12/2018   Procedure: BIOPSY;  Surgeon: Lynann Bologna, MD;  Location: [facility] ENDOSCOPY;  Service: Endoscopy;;  . ESOPHAGOGASTRODUODENOSCOPY (EGD) WITH PROPOFOL N/A 10/12/2018   Procedure: ESOPHAGOGASTRODUODENOSCOPY (EGD) WITH PROPOFOL;  Surgeon: Lynann Bologna, MD;  Location: [facility] ENDOSCOPY;  Service: Endoscopy;  Laterality: N/A;  . HERNIA REPAIR    . LEFT HEART CATH AND CORONARY ANGIOGRAPHY N/A 10/13/2018   Procedure: LEFT HEART CATH AND CORONARY ANGIOGRAPHY;  Surgeon: Kathleene Hazel, MD;  Location: [facility] INVASIVE CV LAB;  Service: Cardiovascular;  Laterality: N/A;  . TONSILLECTOMY AND ADENOIDECTOMY    . WISDOM TOOTH EXTRACTION  2017     OB History   No obstetric history on file.     Family History  Problem Relation Age of Onset  . Diabetes Mother   . Hypertension Mother   . Obesity Mother   . Asthma Mother   . Allergic rhinitis Mother   . Eczema Mother   . Cervical cancer Mother   . Diabetes Father   . Hypertension Father   . Obesity Father   . Hyperlipidemia Father   . Hypertension Paternal Aunt   . Hypertension Maternal Grandfather   . Colon cancer Maternal Grandfather   . Diabetes Paternal Grandmother   . Obesity Paternal Grandmother   . Diabetes Paternal Grandfather   . Obesity Paternal Grandfather   . Angioedema Neg Hx   . Immunodeficiency Neg Hx   . Urticaria Neg Hx   . Stomach cancer Neg Hx   . Esophageal cancer Neg Hx     Social History   Tobacco Use  . Smoking status: Never Smoker  . Smokeless tobacco: Never Used  Vaping Use  . Vaping Use: Never used  Substance Use Topics  . Alcohol use: No    Alcohol/week: 0.0 standard drinks  . Drug use: No    Home Medications Prior to Admission medications   Medication Sig Start Date End Date Taking? Authorizing Provider  atorvastatin (LIPITOR) 20 MG tablet Take 1 tablet (20 mg total) by mouth daily at 6 PM.  10/05/18   Storm Frisk, MD  bisacodyl (DULCOLAX) 10 MG suppository Place 1 suppository (10 mg total) rectally daily. Patient taking differently: Place 10 mg rectally daily as needed for mild constipation.  12/24/18   Swayze, Ava, DO  busPIRone (BUSPAR) 10 MG tablet Take 10 mg by mouth 3 (three) times daily.  11/19/18   [provider]  [place] Oil-Levomenthol (FDGARD) 25-20.75 MG CAPS Take 2 capsules by mouth as needed. Take as directed as needed Patient not taking: Reported on 08/05/2019 03/21/19   Benancio Deeds, MD  cycloSPORINE (RESTASIS) 0.05 % ophthalmic emulsion Place 1 drop into both eyes 2 (two) times daily.    [provider]  dicyclomine (BENTYL) 10 MG capsule Take 10 mg by mouth 3 (three) times daily as needed for spasms.  07/05/19   [provider]  hydrOXYzine (ATARAX/VISTARIL) 50 MG tablet Take 1 tablet (50 mg total) by mouth 3 (three) times daily as needed for anxiety. Patient taking differently: Take  50 mg by mouth in the morning and at bedtime.  11/11/18   Armandina Stammer I, NP  insulin aspart (NOVOLOG FLEXPEN) 100 UNIT/ML FlexPen Inject 15 Units into the skin 3 (three) times daily with meals.    [provider]  insulin glargine (LANTUS) 100 UNIT/ML injection Inject 0.6 mLs (60 Units total) into the skin at bedtime. For diabetes management Patient not taking: Reported on 08/05/2019 11/11/18   Armandina Stammer I, NP  lamoTRIgine (LAMICTAL) 25 MG tablet Take 2 tablets (50 mg total) by mouth 2 (two) times daily. For mood stabilization 11/11/18   Nwoko, Nicole Kindred I, NP  LANTUS SOLOSTAR 100 UNIT/ML Solostar Pen Inject 70 Units into the skin at bedtime.  07/05/19   [provider]  lubiprostone (AMITIZA) 24 MCG capsule Take 1 capsule (24 mcg total) by mouth 2 (two) times daily with a meal. 10/27/18   Esterwood, Amy S, PA-C  mirtazapine (REMERON) 7.5 MG tablet Take 1 tablet (7.5 mg total) by mouth at bedtime. For depression/sleep 11/11/18   Armandina Stammer  I, NP  Multiple Vitamin (MULTIVITAMIN WITH MINERALS) TABS tablet Take 1 tablet by mouth daily. 09/18/18   Clapacs, Jackquline Denmark, MD  Olopatadine HCl (PAZEO) 0.7 % SOLN Place 2 drops into both eyes 2 (two) times daily.     [provider]  pantoprazole (PROTONIX) 40 MG tablet Take 1 tablet (40 mg total) by mouth 2 (two) times daily. For acid reflux 01/29/19   Jae Dire, MD  polyethylene glycol (MIRALAX / GLYCOLAX) 17 g packet Take 17 g by mouth daily. Patient taking differently: Take 17 g by mouth daily as needed for moderate constipation.  12/24/18   Swayze, Ava, DO  prochlorperazine (COMPAZINE) 25 MG suppository Place 1 suppository (25 mg total) rectally every 12 (twelve) hours as needed for nausea or vomiting. 12/05/18   Melene Plan, DO  promethazine (PHENERGAN) 25 MG suppository Place 25 mg rectally every 8 (eight) hours as needed for nausea or vomiting.    [provider]  propranolol (INDERAL) 20 MG tablet Take 1 tablet (20 mg total) by mouth 2 (two) times daily. For anxiety/HTN 12/23/18   Swayze, Ava, DO  Prucalopride Succinate (MOTEGRITY) 2 MG TABS Take 1 tablet (2 mg total) by mouth daily. 03/21/19   Armbruster, Willaim Rayas, MD  QUEtiapine (SEROQUEL) 100 MG tablet Take 300 mg by mouth at bedtime. 07/05/19   [provider]  QUEtiapine (SEROQUEL) 200 MG tablet Take 2 tablets (400 mg total) by mouth at bedtime. Patient not taking: Reported on 08/05/2019 01/15/19   Gwyneth Sprout, MD  sucralfate (CARAFATE) 1 GM/10ML suspension Take 1 g by mouth 3 (three) times daily. 07/05/19   [provider]  topiramate (TOPAMAX) 25 MG tablet Take 1 tablet (25 mg total) by mouth 2 (two) times daily. For seizure activities 11/11/18   Armandina Stammer I, NP    Allergies    Ibuprofen and Oatmeal  Review of Systems   Review of Systems  Neurological: Positive for seizures.  All other systems reviewed and are negative.   Physical Exam Updated Vital Signs BP (!) 136/92   Pulse (!)  116   Temp 98.2 F (36.8 C)   Resp 21   LMP 07/20/2019 (Approximate)   SpO2 100%   Physical Exam Vitals and nursing note reviewed.  Constitutional:      Appearance: She is well-developed. She is obese.     Comments: Obese female sitting upright, appears uncomfortable, dry heaving  HENT:  Head: Atraumatic.     Mouth/Throat:     Comments: Poor dentition. Eyes:     Conjunctiva/sclera: Conjunctivae normal.  Cardiovascular:     Rate and Rhythm: Tachycardia present.     Pulses: Normal pulses.     Heart sounds: Normal heart sounds.  Pulmonary:     Effort: Pulmonary effort is normal.     Breath sounds: Normal breath sounds.  Abdominal:     Palpations: Abdomen is soft.     Tenderness: There is abdominal tenderness (Tenderness to epigastric without guarding or rebound tenderness.).  Musculoskeletal:     Cervical back: Neck supple.     Comments: Able to move all 4 extremities with equal strength.  Skin:    General: Skin is warm.     Findings: No rash.  Neurological:     Mental Status: She is alert and oriented to person, place, and time.  Psychiatric:        Mood and Affect: Mood normal.     ED Results / Procedures / Treatments   Labs (all labs ordered are listed, but only abnormal results are displayed) Labs Reviewed  CBC WITH DIFFERENTIAL/PLATELET - Abnormal; Notable for the following components:      Result Value   RBC 5.40 (*)    MCH 25.6 (*)    nRBC 0.6 (*)    Neutro Abs 8.0 (*)    Abs Immature Granulocytes 0.56 (*)    All other components within normal limits  COMPREHENSIVE METABOLIC PANEL - Abnormal; Notable for the following components:   CO2 18 (*)    Glucose, Bld 325 (*)    Total Protein 8.4 (*)    Anion gap 22 (*)    All other components within normal limits  URINALYSIS, ROUTINE W REFLEX MICROSCOPIC - Abnormal; Notable for the following components:   Specific Gravity, Urine 1.035 (*)    Glucose, UA >=500 (*)    Ketones, ur 80 (*)    All other  components within normal limits  RAPID URINE DRUG SCREEN, HOSP PERFORMED - Abnormal; Notable for the following components:   Opiates POSITIVE (*)    Tetrahydrocannabinol POSITIVE (*)    All other components within normal limits  BETA-HYDROXYBUTYRIC ACID - Abnormal; Notable for the following components:   Beta-Hydroxybutyric Acid 4.27 (*)    All other components within normal limits  BLOOD GAS, VENOUS - Abnormal; Notable for the following components:   pCO2, Ven 40.6 (*)    Acid-base deficit 4.4 (*)    All other components within normal limits  CBG MONITORING, ED - Abnormal; Notable for the following components:   Glucose-Capillary 364 (*)    All other components within normal limits  CBG MONITORING, ED - Abnormal; Notable for the following components:   Glucose-Capillary 316 (*)    All other components within normal limits  SARS CORONAVIRUS 2 BY RT PCR (HOSPITAL ORDER, PERFORMED IN [facility])  LIPASE, BLOOD  BETA-HYDROXYBUTYRIC ACID  BASIC METABOLIC PANEL  BASIC METABOLIC PANEL  BASIC METABOLIC PANEL  BASIC METABOLIC PANEL  HEMOGLOBIN A1C  HEMOGLOBIN AND HEMATOCRIT, BLOOD  HEMOGLOBIN AND HEMATOCRIT, BLOOD  I-STAT BETA HCG BLOOD, ED ([facility], [facility], [facility] ONLY)  CBG MONITORING, ED  CBG MONITORING, ED    EKG None  ED ECG REPORT   Date: 08/06/2019  Rate: 123  Rhythm: sinus tachycardia  QRS Axis: normal  Intervals: normal  ST/T Wave abnormalities: nonspecific ST changes  Conduction Disutrbances:none  Narrative Interpretation:   Old EKG Reviewed: unchanged  I have personally reviewed the EKG tracing and agree with the computerized printout as noted.   Radiology No results found.  Procedures .Critical Care Performed by: Fayrene Helper, PA-C Authorized by: Fayrene Helper, PA-C   Critical care provider statement:    Critical care time (minutes):  32   Critical care was time spent personally by me on the following activities:  Discussions with consultants, evaluation  of patient's response to treatment, examination of patient, ordering and performing treatments and interventions, ordering and review of laboratory studies, ordering and review of radiographic studies, pulse oximetry, re-evaluation of patient's condition, obtaining history from patient or surrogate and review of old charts   (including critical care time)  Medications Ordered in ED Medications  insulin regular, human (MYXREDLIN) 100 units/ 100 mL infusion (13 Units/hr Intravenous New Bag/Given 08/06/19 1447)  0.9 %  sodium chloride infusion (has no administration in time range)  dextrose 5 %-0.45 % sodium chloride infusion (has no administration in time range)  dextrose 50 % solution 0-50 mL (has no administration in time range)  sodium chloride 0.9 % bolus 1,000 mL (1,000 mLs Intravenous New Bag/Given 08/06/19 1439)  potassium chloride 10 mEq in 100 mL IVPB (10 mEq Intravenous New Bag/Given 08/06/19 1444)  pantoprazole (PROTONIX) injection 40 mg (has no administration in time range)  metoCLOPramide (REGLAN) injection 10 mg (has no administration in time range)  ondansetron (ZOFRAN) injection 4 mg (has no administration in time range)  promethazine (PHENERGAN) injection 6.25 mg (has no administration in time range)  atorvastatin (LIPITOR) tablet 20 mg (has no administration in time range)  busPIRone (BUSPAR) tablet 10 mg (has no administration in time range)  cycloSPORINE (RESTASIS) 0.05 % ophthalmic emulsion 1 drop (has no administration in time range)  dicyclomine (BENTYL) capsule 10 mg (has no administration in time range)  hydrOXYzine (ATARAX/VISTARIL) tablet 50 mg (has no administration in time range)  mirtazapine (REMERON) tablet 7.5 mg (has no administration in time range)  lamoTRIgine (LAMICTAL) tablet 50 mg (has no administration in time range)  propranolol (INDERAL) tablet 20 mg (has no administration in time range)  Prucalopride Succinate TABS 2 mg (has no administration in time  range)  QUEtiapine (SEROQUEL) tablet 300 mg (has no administration in time range)  sucralfate (CARAFATE) 1 GM/10ML suspension 1 g (has no administration in time range)  topiramate (TOPAMAX) tablet 25 mg (has no administration in time range)  sodium chloride 0.9 % bolus 1,000 mL (0 mLs Intravenous Stopped 08/06/19 1400)  morphine 4 MG/ML injection 4 mg (4 mg Intravenous Given 08/06/19 1223)  pantoprazole (PROTONIX) injection 40 mg (40 mg Intravenous Given 08/06/19 1224)  promethazine (PHENERGAN) injection 25 mg (25 mg Intravenous Given 08/06/19 1229)  haloperidol lactate (HALDOL) injection 5 mg (5 mg Intravenous Given 08/06/19 1439)  metoCLOPramide (REGLAN) injection 10 mg (10 mg Intravenous Given 08/06/19 1439)    ED Course  I have reviewed the triage vital signs and the nursing notes.  Pertinent labs & imaging results that were available during my care of the patient were reviewed by me and considered in my medical decision making (see chart for details).    MDM Rules/Calculators/A&P                          BP (!) 136/92   Pulse (!) 116   Temp 98.2 F (36.8 C)   Resp 21   LMP 07/20/2019 (Approximate)   SpO2 100%   Final Clinical Impression(s) / ED Diagnoses  Final diagnoses:  Diabetic ketoacidosis without coma associated with type 1 diabetes mellitus (HCC)  Intractable vomiting with nausea    Rx / DC Orders ED Discharge Orders    None     11:42 AM Patient with multiple ER visits for complaints of recurrent emesis as well as history of seizure-like disorder.  She was last seen in the ED yesterday for similar complaints of nausea vomiting diarrhea.  Today she also endorsed having multiple episodes of seizure-like activity throughout the day in addition to her persistent nausea.  She has been seen and evaluated multiple times for her condition.  I have reviewed her previous charts, including note from GI specialist Dr. Lanetta Inch.  Patient has had EGD in the past that was normal.   Her last gastric emptying study in 2019 was negative.  She has had multiple advanced imaging without concerning feature.  She also denies marijuana use.  Most recent note from Dr. Adela Lank is on 03/21/2019 does summarize a lot of the symptoms that she is currently experienced.  She has had normal gastric emptying study in the past but she may benefit from repeat gastric emptying study.  He recommend Phenergan for nausea.  As far as her seizure-like activities, and is unlikely that symptom is reflective of epileptic seizure.  She is currently mentating appropriately and answer questions appropriately.  Will defer further examination in that regard.  1:16 PM Lab is consistent with DKA which include anion gap of 22, bicarb of 18, 80 ketones in urine, and a CBG of 325.  Patient denies marijuana use however UDS is positive for tetrahydrocannabinol.  Patient was given Haldol as well as IV fluid and glucose stabilizer for her DKA.  Appreciate consultation from [facility] hospitalist, Dr. Jonathon Bellows who agrees to admit pt for intractable vomiting 2/2 to DKA, and possibly cannabinoid hyperemesis syndrome.    MEILAH DELROSARIO was evaluated in Emergency Department on 08/06/2019 for the symptoms described in the history of present illness. She was evaluated in the context of the global COVID-19 pandemic, which necessitated consideration that the patient might be at risk for infection with the SARS-CoV-2 virus that causes COVID-19. Institutional protocols and algorithms that pertain to the evaluation of patients at risk for COVID-19 are in a state of rapid change based on information released by regulatory bodies including the CDC and federal and state organizations. These policies and algorithms were followed during the patient's care in the ED.      Fayrene Helper, PA-C 08/06/19 1511    Fayrene Helper, PA-C 08/06/19 1511    Cathren Laine, MD 08/07/19 [phone]

## 2019-08-07 ENCOUNTER — Encounter (HOSPITAL_COMMUNITY): Payer: Self-pay | Admitting: Internal Medicine

## 2019-08-07 DIAGNOSIS — Z833 Family history of diabetes mellitus: Secondary | ICD-10-CM | POA: Diagnosis not present

## 2019-08-07 DIAGNOSIS — E876 Hypokalemia: Secondary | ICD-10-CM | POA: Diagnosis present

## 2019-08-07 DIAGNOSIS — K5909 Other constipation: Secondary | ICD-10-CM | POA: Diagnosis present

## 2019-08-07 DIAGNOSIS — Z8249 Family history of ischemic heart disease and other diseases of the circulatory system: Secondary | ICD-10-CM | POA: Diagnosis not present

## 2019-08-07 DIAGNOSIS — E860 Dehydration: Secondary | ICD-10-CM | POA: Diagnosis present

## 2019-08-07 DIAGNOSIS — F338 Other recurrent depressive disorders: Secondary | ICD-10-CM | POA: Diagnosis present

## 2019-08-07 DIAGNOSIS — D72829 Elevated white blood cell count, unspecified: Secondary | ICD-10-CM | POA: Diagnosis present

## 2019-08-07 DIAGNOSIS — E875 Hyperkalemia: Secondary | ICD-10-CM | POA: Diagnosis present

## 2019-08-07 DIAGNOSIS — Z6838 Body mass index (BMI) 38.0-38.9, adult: Secondary | ICD-10-CM | POA: Diagnosis not present

## 2019-08-07 DIAGNOSIS — F1290 Cannabis use, unspecified, uncomplicated: Secondary | ICD-10-CM | POA: Diagnosis present

## 2019-08-07 DIAGNOSIS — F449 Dissociative and conversion disorder, unspecified: Secondary | ICD-10-CM | POA: Diagnosis present

## 2019-08-07 DIAGNOSIS — Z20822 Contact with and (suspected) exposure to covid-19: Secondary | ICD-10-CM | POA: Diagnosis present

## 2019-08-07 DIAGNOSIS — F1190 Opioid use, unspecified, uncomplicated: Secondary | ICD-10-CM | POA: Diagnosis present

## 2019-08-07 DIAGNOSIS — R112 Nausea with vomiting, unspecified: Secondary | ICD-10-CM | POA: Diagnosis present

## 2019-08-07 DIAGNOSIS — I10 Essential (primary) hypertension: Secondary | ICD-10-CM | POA: Diagnosis present

## 2019-08-07 DIAGNOSIS — F419 Anxiety disorder, unspecified: Secondary | ICD-10-CM | POA: Diagnosis present

## 2019-08-07 DIAGNOSIS — Z794 Long term (current) use of insulin: Secondary | ICD-10-CM | POA: Diagnosis not present

## 2019-08-07 DIAGNOSIS — D649 Anemia, unspecified: Secondary | ICD-10-CM | POA: Diagnosis present

## 2019-08-07 DIAGNOSIS — Z886 Allergy status to analgesic agent status: Secondary | ICD-10-CM | POA: Diagnosis not present

## 2019-08-07 DIAGNOSIS — R569 Unspecified convulsions: Secondary | ICD-10-CM | POA: Diagnosis present

## 2019-08-07 DIAGNOSIS — F445 Conversion disorder with seizures or convulsions: Secondary | ICD-10-CM | POA: Diagnosis not present

## 2019-08-07 DIAGNOSIS — Z79899 Other long term (current) drug therapy: Secondary | ICD-10-CM | POA: Diagnosis not present

## 2019-08-07 DIAGNOSIS — G8929 Other chronic pain: Secondary | ICD-10-CM | POA: Diagnosis present

## 2019-08-07 DIAGNOSIS — E1010 Type 1 diabetes mellitus with ketoacidosis without coma: Secondary | ICD-10-CM | POA: Diagnosis present

## 2019-08-07 DIAGNOSIS — Z9114 Patient's other noncompliance with medication regimen: Secondary | ICD-10-CM | POA: Diagnosis not present

## 2019-08-07 LAB — BASIC METABOLIC PANEL
Anion gap: 11 (ref 5–15)
Anion gap: 12 (ref 5–15)
Anion gap: 9 (ref 5–15)
BUN: 10 mg/dL (ref 6–20)
BUN: 10 mg/dL (ref 6–20)
BUN: 9 mg/dL (ref 6–20)
CO2: 22 mmol/L (ref 22–32)
CO2: 22 mmol/L (ref 22–32)
CO2: 23 mmol/L (ref 22–32)
Calcium: 8.3 mg/dL — ABNORMAL LOW (ref 8.9–10.3)
Calcium: 8.5 mg/dL — ABNORMAL LOW (ref 8.9–10.3)
Calcium: 9 mg/dL (ref 8.9–10.3)
Chloride: 106 mmol/L (ref 98–111)
Chloride: 106 mmol/L (ref 98–111)
Chloride: 106 mmol/L (ref 98–111)
Creatinine, Ser: 0.49 mg/dL (ref 0.44–1.00)
Creatinine, Ser: 0.5 mg/dL (ref 0.44–1.00)
Creatinine, Ser: 0.51 mg/dL (ref 0.44–1.00)
GFR calc Af Amer: >60 mL/min (ref >60)
GFR calc Af Amer: >60 mL/min (ref >60)
GFR calc Af Amer: >60 mL/min (ref >60)
GFR calc non Af Amer: >60 mL/min (ref >60)
GFR calc non Af Amer: >60 mL/min (ref >60)
GFR calc non Af Amer: >60 mL/min (ref >60)
Glucose, Bld: 126 mg/dL — ABNORMAL HIGH (ref 70–99)
Glucose, Bld: 157 mg/dL — ABNORMAL HIGH (ref 70–99)
Glucose, Bld: 193 mg/dL — ABNORMAL HIGH (ref 70–99)
Potassium: 3.4 mmol/L — ABNORMAL LOW (ref 3.5–5.1)
Potassium: 3.4 mmol/L — ABNORMAL LOW (ref 3.5–5.1)
Potassium: 3.6 mmol/L (ref 3.5–5.1)
Sodium: 138 mmol/L (ref 135–145)
Sodium: 139 mmol/L (ref 135–145)
Sodium: 140 mmol/L (ref 135–145)

## 2019-08-07 LAB — CBG MONITORING, ED
Glucose-Capillary: 142 mg/dL — ABNORMAL HIGH (ref 70–99)
Glucose-Capillary: 151 mg/dL — ABNORMAL HIGH (ref 70–99)
Glucose-Capillary: 133 mg/dL — ABNORMAL HIGH (ref 70–99)
Glucose-Capillary: 183 mg/dL — ABNORMAL HIGH (ref 70–99)

## 2019-08-07 LAB — BETA-HYDROXYBUTYRIC ACID
Beta-Hydroxybutyric Acid: 0.53 mmol/L — ABNORMAL HIGH (ref 0.05–0.27)
Beta-Hydroxybutyric Acid: 0.74 mmol/L — ABNORMAL HIGH (ref 0.05–0.27)

## 2019-08-07 LAB — HEMOGLOBIN AND HEMATOCRIT, BLOOD
HCT: 35.8 % — ABNORMAL LOW (ref 36.0–46.0)
HCT: 38.7 % (ref 36.0–46.0)
Hemoglobin: 11.2 g/dL — ABNORMAL LOW (ref 12.0–15.0)
Hemoglobin: 12.2 g/dL (ref 12.0–15.0)

## 2019-08-07 MED ORDER — TRAMADOL HCL 50 MG PO TABS: 50.0000 mg | ORAL_TABLET | Freq: Once | ORAL | Status: DC

## 2019-08-07 MED ORDER — PROMETHAZINE HCL 25 MG/ML IJ SOLN
25.0000 mg | Freq: Once | INTRAMUSCULAR | Status: AC
  Administered 2019-08-07: 25 mg via INTRAVENOUS

## 2019-08-07 MED ORDER — MORPHINE SULFATE (PF) 2 MG/ML IV SOLN
2.0000 mg | Freq: Once | INTRAVENOUS | Status: AC
  Administered 2019-08-07: 2 mg via INTRAVENOUS
  Filled 2019-08-07: qty 1

## 2019-08-07 MED ORDER — POTASSIUM CHLORIDE CRYS ER 20 MEQ PO TBCR
40.0000 meq | EXTENDED_RELEASE_TABLET | Freq: Once | ORAL | Status: AC
  Administered 2019-08-07: 40 meq via ORAL
  Filled 2019-08-07: qty 2

## 2019-08-07 MED ORDER — TRAMADOL HCL 50 MG PO TABS
50.0000 mg | ORAL_TABLET | Freq: Once | ORAL | Status: AC
  Administered 2019-08-07: 50 mg via ORAL
  Filled 2019-08-07: qty 1

## 2019-08-07 MED ORDER — MORPHINE SULFATE (PF) 2 MG/ML IV SOLN
1.0000 mg | Freq: Once | INTRAVENOUS | Status: AC
  Administered 2019-08-07: 1 mg via INTRAVENOUS
  Filled 2019-08-07: qty 1

## 2019-08-07 MED ORDER — HYDRALAZINE HCL 20 MG/ML IJ SOLN
10.0000 mg | Freq: Four times a day (QID) | INTRAMUSCULAR | Status: DC | PRN
  Administered 2019-08-07: 10 mg via INTRAVENOUS
  Filled 2019-08-07: qty 1

## 2019-08-07 MED ORDER — POTASSIUM CHLORIDE 20 MEQ PO PACK
40.0000 meq | PACK | Freq: Once | ORAL | Status: AC
  Administered 2019-08-07: 40 meq via ORAL
  Filled 2019-08-07: qty 2

## 2019-08-07 NOTE — ED Notes (Signed)
Manuela Schwartz NP messaged regarding pt abd pain/discomfort and downgrading pt due to dc of insulin drip, robaxin ordered and pt to be changed to med-surg bed

## 2019-08-07 NOTE — ED Notes (Signed)
Cliffton Asters NP messaged regarding potassium of 3.4

## 2019-08-07 NOTE — Progress Notes (Signed)
PROGRESS NOTE    Nancy [name]  ZOX:[id number] DOB: 2000-12-26 DOA: 08/06/2019   PCP: Fleet Contras, MD    Brief Narrative:  Nancy [name] is a 19 y.o. 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 Patient was seen in the ED yesterday with abdominal pain, nausea, vomiting and was discharged home.  After within an hour returning as her symptoms return at home with persistent nausea vomiting.  Blood in vomitus several cups of emesis no streaks.  Her mom also noticed seizure-like activity with shaking and tremors at least 20 separate episodes throughout the day.  Patient is unable to take her medication or hydrate herself so came to the ER. She did not take her insulin yesterday, she reports seeing Dr. Adela Lank from GI for recurrent nausea and vomiting and had gastric emptying test in the past.  She was Hypertensive,  tachycardic up to 140s, saturating well on room air.  Blood work shows pH 7.3, anion gap 22 with blood sugar 325, bicarb 18, normal LFTs and lipase, elevated beta hydroxybutyric acid at 4.2, abnormal UA with ketones 80, blood sugar more than 500, positive for opiates and THC.  Patient was given 2 L bolus normal saline and also received Reglan 10 mg, Haldol 5 mg, morphine 4 mg, Protonix 40 mg, Phenergan 25 mg symptoms persisted and admission was requested for management of DKA.  She is admitted for diabetic ketoacidosis, anion gap closed, insulin drip discontinued and started on subcu insulin.  She continues to remain nauseous and throwing up.  Assessment & Plan:   Principal Problem:   Type 1 diabetes mellitus with ketoacidosis without coma (HCC) Active Problems:   Obesity   Hypertension   Non compliance with medical treatment   Pseudoseizures   Dehydration   DKA  (diabetic ketoacidoses) (HCC)  DKA with poorly controlled insulin-dependent diabetes: triggered by patient's intractable nausea and vomiting.  Did not take insulin last night.  Patient has been started on insulin drip,  will continue same , serial BMP and every hour Accu-Chek as per endocrine and DKA protocol with iv fluid hydration, once anion gap closes, start diet and and switch to long-acting insulin home regimen along with sliding scale/Premeal insulin after making 2-hour overlap with the IV -sq insulin. Anion gap closed, transitioned to subcu insulin.  Intractable nausea and vomiting:  has had similar prior presentation and was seen by Dr. Clydene Pugh from GI and had gastric emptying study.  CT preliminary.  Repeat gastric emptying study at some point and patient was outpatient.  We will keep on as needed Zofran, Phenergan  and a scheduled Reglan, IV Protonix Q12H. Patient reports blood in the vomitus and noted that she has had similar previous episodes. We will avoid Lovenox/heparin- if she has recurrent issues will consult GI, check H&H serially.  Dehydration DKA with tachycardia.Continue IV hydration aggressively as above.  Type 1 diabetes mellitus with ketoacidosis without coma : Patient takes 70 units Lantus at bedtime and 15 units Humalog premeal 3 times a day.  last hba1c was 13.4 in February/21. For now continue the patient on insulin drip with DKA protocol as #1.  Tremor/shaking episodes multiple times as none sedation/versus seizure.  Has had similar previous presentation.  We will resume her home Lamictal/Topamax.  Anxiety/depression-resume home meds.  Noncompliance/positive for THC and opiates in the urine.   Morbid obesity with BMI  38, will benefit with weight loss and healthy lifestyle.  Hypertension: Blood pressure on higher side resume home meds   DVT prophylaxis: SCDs, She reports intermittent episodes of blood in the vomitus. Code Status: Full Family  Communication: No one at bed side. Disposition Plan: Anticipated discharge when she is out of DKA nausea and vomiting improved.  She is not medically clear due to the ongoing intractable nausea and vomiting.  Consultants:   None.  Procedures: None.  Antimicrobials:  Anti-infectives (From admission, onward)   None     Subjective: Patient was seen and examined at bedside.  She continues to be remain nauseous and throwing up.  She also reports having abdominal pain.   Overnight events noted.  Objective: Vitals:   08/07/19 1459 08/07/19 1507 08/07/19 1509 08/07/19 1517  BP: (!) 170/119     Pulse: 85 81 87 82  Resp:      Temp:      TempSrc:      SpO2: 100% 100% 100% 100%    Intake/Output Summary (Last 24 hours) at 08/07/2019 1518 Last data filed at 08/07/2019 0009 Gross per 24 hour  Intake 661.17 ml  Output --  Net 661.17 ml   There were no vitals filed for this visit.  Examination:  General exam: Appears calm and comfortable  Respiratory system: Clear to auscultation. Respiratory effort normal. Cardiovascular system: S1 & S2 heard, RRR. No JVD, murmurs, rubs, gallops or clicks. No pedal edema. Gastrointestinal system: Abdomen is nondistended, soft and nontender. No organomegaly or masses felt. Normal bowel sounds heard. Central nervous system: Alert and oriented. No focal neurological deficits. Extremities: No swelling, No cyanosis. Skin: No rashes, lesions or ulcers Psychiatry: Judgement and insight appear normal. Mood & affect appropriate.     Data Reviewed: I have personally reviewed following labs and imaging studies.  CBC: Recent Labs  Lab 08/05/19 1430 08/05/19 1430 08/05/19 1742 08/06/19 1220 08/06/19 1904 08/07/19 0304 08/07/19 1330  WBC 12.0*  --   --  10.3  --   --   --   NEUTROABS  --   --   --  8.0*  --   --   --   HGB 13.4   < > 12.3 13.8 11.8* 11.2* 12.2  HCT 43.4   < > 39.1 43.5 38.3 35.8* 38.7  MCV 81.1  --   --  80.6  --   --   --   PLT  313  --   --  359  --   --   --    < > = values in this interval not displayed.   Basic Metabolic Panel: Recent Labs  Lab 08/06/19 1710 08/06/19 1904 08/06/19 2345 08/07/19 0304 08/07/19 1330  NA 140 141 139 138 140  K 4.4 3.8 3.4* 3.4* 3.6  CL 112* 109 106 106 106  CO2 22 21* 22 23 22   GLUCOSE 167* 154* 157* 193* 126*  BUN 10 9 9 10 10   CREATININE 0.47 0.43* 0.50 0.49 0.51  CALCIUM 8.0* 8.4* 8.3* 8.5* 9.0   GFR: Estimated Creatinine Clearance: 123.6 mL/min (by C-G formula based on SCr of 0.51 mg/dL). Liver Function Tests: Recent Labs  Lab 08/05/19 1430 08/06/19 1220  AST 14* 17  ALT 16 14  ALKPHOS 98 103  BILITOT 0.5 1.2  PROT 8.1 8.4*  ALBUMIN 4.7 4.7   Recent Labs  Lab 08/06/19 1220  LIPASE 22   No results for input(s): AMMONIA in the last 168 hours. Coagulation Profile:  No results for input(s): INR, PROTIME in the last 168 hours. Cardiac Enzymes: No results for input(s): CKTOTAL, CKMB, CKMBINDEX, TROPONINI in the last 168 hours. BNP (last 3 results) No results for input(s): PROBNP in the last 8760 hours. HbA1C: Recent Labs    08/06/19 1710  HGBA1C 11.8*   CBG: Recent Labs  Lab 08/06/19 2142 08/06/19 2343 08/07/19 0751 08/07/19 0828 08/07/19 1210  GLUCAP 134* 160* 142* 151* 133*   Lipid Profile: No results for input(s): CHOL, HDL, LDLCALC, TRIG, CHOLHDL, LDLDIRECT in the last 72 hours. Thyroid Function Tests: No results for input(s): TSH, T4TOTAL, FREET4, T3FREE, THYROIDAB in the last 72 hours. Anemia Panel: No results for input(s): VITAMINB12, FOLATE, FERRITIN, TIBC, IRON, RETICCTPCT in the last 72 hours. Sepsis Labs: No results for input(s): PROCALCITON, LATICACIDVEN in the last 168 hours.  Recent Results (from the past 240 hour(s))  SARS Coronavirus 2 by RT PCR (hospital order, performed in [facility] hospital lab) Nasopharyngeal Nasopharyngeal Swab     Status: None   Collection Time: 08/06/19  2:52 PM   Specimen: Nasopharyngeal Swab    Result Value Ref Range Status   SARS Coronavirus 2 NEGATIVE NEGATIVE Final    Comment: (NOTE) SARS-CoV-2 target nucleic acids are NOT DETECTED.  The SARS-CoV-2 RNA is generally detectable in upper and lower respiratory specimens during the acute phase of infection. The lowest concentration of SARS-CoV-2 viral copies this assay can detect is 250 copies / mL. A negative result does not preclude SARS-CoV-2 infection and should not be used as the sole basis for treatment or other patient management decisions.  A negative result may occur with improper specimen collection / handling, submission of specimen other than nasopharyngeal swab, presence of viral mutation(s) within the areas targeted by this assay, and inadequate number of viral copies (<250 copies / mL). A negative result must be combined with clinical observations, patient history, and epidemiological information.  Fact Sheet for Patients:   BoilerBrush.com.cy  Fact Sheet for Healthcare Providers: https://pope.com/  This test is not yet approved or  cleared by the [place] FDA and has been authorized for detection and/or diagnosis of SARS-CoV-2 by FDA under an Emergency Use Authorization (EUA).  This EUA will remain in effect (meaning this test can be used) for the duration of the COVID-19 declaration under Section 564(b)(1) of the Act, 21 U.S.C. section 360bbb-3(b)(1), unless the authorization is terminated or revoked sooner.  Performed at [facility], [address], [place], [place] [address]     Radiology Studies: No results found.   Scheduled Meds: . atorvastatin  20 mg Oral q1800  . busPIRone  10 mg Oral TID  . cycloSPORINE  1 drop Both Eyes BID  . insulin aspart  0-15 Units Subcutaneous TID WC  . insulin aspart  0-5 Units Subcutaneous QHS  . insulin aspart  10 Units Subcutaneous TID WC  . insulin glargine  50 Units Subcutaneous QHS  .  lamoTRIgine  50 mg Oral BID  . metoCLOPramide (REGLAN) injection  10 mg Intravenous Q6H  . mirtazapine  7.5 mg Oral QHS  .  morphine injection  2 mg Intravenous Once  . pantoprazole (PROTONIX) IV  40 mg Intravenous Q12H  . propranolol  20 mg Oral BID  . Prucalopride Succinate  2 mg Oral Daily  . QUEtiapine  300 mg Oral QHS  . sucralfate  1 g Oral TID  . topiramate  25 mg Oral BID   Continuous Infusions: . sodium chloride Stopped (08/06/19 2337)  .  dextrose 5 % and 0.45% NaCl Stopped (08/06/19 2210)     LOS: 0 days    Time spent: 25 mins.    Cipriano Bunker, MD [facility]   If 7PM-7AM, please contact night-coverage

## 2019-08-07 NOTE — ED Notes (Signed)
Pt ambulated to bathroom without assistance 

## 2019-08-07 NOTE — ED Notes (Signed)
She is resting quietly. 

## 2019-08-07 NOTE — ED Notes (Signed)
I have just called report to Carollee Herter, RN on [address]. Will transport shortly.

## 2019-08-07 NOTE — ED Notes (Signed)
Pt. C/o nausea and "indigestion". I am instructed by the hospitalist, who has just seen her, to give a now IV dose of Protonix and an IV dose of Phenergan, which I do.

## 2019-08-07 NOTE — Progress Notes (Addendum)
Seen at Dr. Remus Blake request for concern for seizures Pt with hx diabetes, seizure vs pseudoseizure on lamictal/topamax, anxiety, depression who was admitted with DKA.  She was transferred to floor today.  Per nurse, concern for generalized shaking/seizure like activities.   Doesn't sound like there was clear postictal period. She c/o abdominal pain, present since admission.  She knew she'd had generalized shaking, but couldn't tell me much about it.  Evaluated at bedside, pt alert and oriented, no focal deficits.  Abdominal exam with mild discomfort, nondistended, soft.  No LEE.    Nancy [name]/P: evaluated after episode of generalized shaking, concern for non epileptic seizure vs seizure. Suspect non epileptic seizure as no post ictal period Will follow EEG - consider neurology c/s if abnormal or recurrent seizures Check POC BG Continue lamictal and topamax as ordered Will need seizure precautions at discharge (no driving)

## 2019-08-07 NOTE — ED Notes (Signed)
She has vomited several times and tells me her abd. Is very uncomfortable. I notify Dr. Lucianne Muss per her request and he tells [place] he will come to see her.

## 2019-08-08 ENCOUNTER — Inpatient Hospital Stay (HOSPITAL_COMMUNITY)
Admit: 2019-08-08 | Discharge: 2019-08-08 | Disposition: A | Discharge status: Home / Self Care | Payer: Medicaid Other | Attending: Family Medicine | Admitting: Family Medicine

## 2019-08-08 DIAGNOSIS — E1010 Type 1 diabetes mellitus with ketoacidosis without coma: Principal | ICD-10-CM

## 2019-08-08 DIAGNOSIS — R569 Unspecified convulsions: Secondary | ICD-10-CM

## 2019-08-08 DIAGNOSIS — F445 Conversion disorder with seizures or convulsions: Secondary | ICD-10-CM

## 2019-08-08 LAB — COMPREHENSIVE METABOLIC PANEL
ALT: 13 U/L (ref 0–44)
AST: 11 U/L — ABNORMAL LOW (ref 15–41)
Albumin: 3.4 g/dL — ABNORMAL LOW (ref 3.5–5.0)
Alkaline Phosphatase: 71 U/L (ref 38–126)
Anion gap: 9 (ref 5–15)
BUN: 9 mg/dL (ref 6–20)
CO2: 23 mmol/L (ref 22–32)
Calcium: 8.5 mg/dL — ABNORMAL LOW (ref 8.9–10.3)
Chloride: 105 mmol/L (ref 98–111)
Creatinine, Ser: 0.53 mg/dL (ref 0.44–1.00)
GFR calc Af Amer: >60 mL/min (ref >60)
GFR calc non Af Amer: >60 mL/min (ref >60)
Glucose, Bld: 163 mg/dL — ABNORMAL HIGH (ref 70–99)
Potassium: 3 mmol/L — ABNORMAL LOW (ref 3.5–5.1)
Sodium: 137 mmol/L (ref 135–145)
Total Bilirubin: 0.8 mg/dL (ref 0.3–1.2)
Total Protein: 6 g/dL — ABNORMAL LOW (ref 6.5–8.1)

## 2019-08-08 LAB — CBC WITH DIFFERENTIAL/PLATELET
Abs Immature Granulocytes: 0.04 10*3/uL (ref 0.00–0.07)
Basophils Absolute: 0 10*3/uL (ref 0.0–0.1)
Basophils Relative: 0 %
Eosinophils Absolute: 0.1 10*3/uL (ref 0.0–0.5)
Eosinophils Relative: 1 %
HCT: 37.2 % (ref 36.0–46.0)
Hemoglobin: 11.8 g/dL — ABNORMAL LOW (ref 12.0–15.0)
Immature Granulocytes: 0 %
Lymphocytes Relative: 32 %
Lymphs Abs: 4 10*3/uL (ref 0.7–4.0)
MCH: 25.7 pg — ABNORMAL LOW (ref 26.0–34.0)
MCHC: 31.7 g/dL (ref 30.0–36.0)
MCV: 80.9 fL (ref 80.0–100.0)
Monocytes Absolute: 1.4 10*3/uL — ABNORMAL HIGH (ref 0.1–1.0)
Monocytes Relative: 11 %
Neutro Abs: 6.7 10*3/uL (ref 1.7–7.7)
Neutrophils Relative %: 56 %
Platelets: 316 10*3/uL (ref 150–400)
RBC: 4.6 MIL/uL (ref 3.87–5.11)
RDW: 15.3 % (ref 11.5–15.5)
WBC: 12.2 10*3/uL — ABNORMAL HIGH (ref 4.0–10.5)
nRBC: 0 % (ref 0.0–0.2)

## 2019-08-08 LAB — GLUCOSE, CAPILLARY
Glucose-Capillary: 155 mg/dL — ABNORMAL HIGH (ref 70–99)
Glucose-Capillary: 193 mg/dL — ABNORMAL HIGH (ref 70–99)
Glucose-Capillary: 226 mg/dL — ABNORMAL HIGH (ref 70–99)

## 2019-08-08 MED ORDER — POTASSIUM CHLORIDE 20 MEQ PO PACK
40.0000 meq | PACK | Freq: Once | ORAL | Status: AC
  Administered 2019-08-08: 40 meq via ORAL
  Filled 2019-08-08: qty 2

## 2019-08-08 MED ORDER — FENTANYL CITRATE (PF) 100 MCG/2ML IJ SOLN
25.0000 ug | Freq: Once | INTRAMUSCULAR | Status: AC
  Administered 2019-08-08: 25 ug via INTRAVENOUS
  Filled 2019-08-08: qty 2

## 2019-08-08 MED ORDER — POTASSIUM CHLORIDE 10 MEQ/100ML IV SOLN
10.0000 meq | INTRAVENOUS | Status: AC
  Administered 2019-08-08 (×3): 10 meq via INTRAVENOUS
  Filled 2019-08-08 (×3): qty 100

## 2019-08-08 MED ORDER — MORPHINE SULFATE (PF) 2 MG/ML IV SOLN
1.0000 mg | Freq: Once | INTRAVENOUS | Status: AC
  Administered 2019-08-08: 1 mg via INTRAVENOUS
  Filled 2019-08-08: qty 1

## 2019-08-08 MED ORDER — MORPHINE SULFATE (PF) 2 MG/ML IV SOLN
2.0000 mg | INTRAVENOUS | Status: DC | PRN
  Administered 2019-08-08 – 2019-08-09 (×5): 2 mg via INTRAVENOUS
  Filled 2019-08-08 (×5): qty 1

## 2019-08-08 MED ORDER — POTASSIUM CHLORIDE 20 MEQ PO PACK
40.0000 meq | PACK | Freq: Once | ORAL | Status: AC
  Administered 2019-08-08: 10:00:00 40 meq via ORAL
  Filled 2019-08-08: qty 2

## 2019-08-08 NOTE — TOC Initial Note (Signed)
Transition of Care [facility]) - Initial/Assessment Note    Patient Details  Name: Nancy [name] MRN: [id number] Date of Birth: 01-29-2000  [facility]) CM/SW Contact:    Lennart Pall, LCSW Phone Number: 08/08/2019, 4:00 PM  Clinical Narrative:                 Met with pt today to complete high risk readmission assessment.  Pt confirms that she is living with her mother and was completely independent PTA.  Working at [employer] location).  Is active with [facility] for primary care and reports no issues with getting her medications.  She does get her mental health meds via [facility].  Reports she was referred there after [facility] admission ~ 8 months ago, however, notes she has never received any counseling support and is interested in this.  Have left a message with Beverly Sessions to contact me to see if we can get something set up for her prior to her d/c.  No other needs identified at this point.  Expected Discharge Plan: Home/Self Care Barriers to Discharge: Continued Medical Work up   Patient Goals and CMS Choice Patient states their goals for this hospitalization and ongoing recovery are:: return home      Expected Discharge Plan and Services Expected Discharge Plan: Home/Self Care       Living arrangements for the past 2 months: Single Family Home                                      Prior Living Arrangements/Services Living arrangements for the past 2 months: Single Family Home Lives with:: Parents Patient language and need for interpreter reviewed:: Yes Do you feel safe going back to the place where you live?: Yes      Need for Family Participation in Patient Care: Yes (Comment) Care giver support system in place?: Yes (comment)   Criminal Activity/Legal Involvement Pertinent to Current Situation/Hospitalization: No - Comment as needed  Activities of Daily Living Home Assistive Devices/Equipment: None ADL Screening (condition at time of admission) Patient's  cognitive ability adequate to safely complete daily activities?: Yes Is the patient deaf or have difficulty hearing?: No Does the patient have difficulty seeing, even when wearing glasses/contacts?: No Does the patient have difficulty concentrating, remembering, or making decisions?: No Patient able to express need for assistance with ADLs?: Yes Does the patient have difficulty dressing or bathing?: No Independently performs ADLs?: Yes (appropriate for developmental age) Does the patient have difficulty walking or climbing stairs?: No Weakness of Legs: None Weakness of Arms/Hands: None  Permission Sought/Granted Permission sought to share information with : Other (comment) ([facility]) Permission granted to share information with : Yes, Verbal Permission Granted     Permission granted to share info w AGENCY: [facility]        Emotional Assessment Appearance:: Appears stated age Attitude/Demeanor/Rapport: Gracious, Engaged Affect (typically observed): Accepting, Pleasant Orientation: : Oriented to Place, Oriented to  Time, Oriented to Situation, Oriented to Self Alcohol / Substance Use: Not Applicable Psych Involvement: No (comment)  Admission diagnosis:  DKA (diabetic ketoacidoses) ([place]) [E11.10] Diabetic ketoacidosis without coma associated with type 1 diabetes mellitus ([place]) [E10.10] Intractable vomiting with nausea [R11.2] Patient Active Problem List   Diagnosis Date Noted  . Cellulitis   . DKA (diabetic ketoacidoses) ([place]) 01/27/2019  . Leukocytosis 12/19/2018  . Dehydration   . Pseudoseizures   . Type  1 diabetes mellitus with ketoacidosis without coma ([place]) 11/28/2018  . Hyperglycemia due to diabetes mellitus ([place]) 11/27/2018  . Severe recurrent major depression without psychotic features ([place]) 11/08/2018  . MDD (major depressive disorder), recurrent episode, severe ([place]) 11/06/2018  . Nonspecific abnormal electrocardiogram (ECG) (EKG)   . Chest pain of  uncertain etiology   . Hypertensive urgency 10/08/2018  . Conversion disorder with attacks or seizures, acute episode, with psychological stressor 09/16/2018  . MDD (major depressive disorder), recurrent severe, without psychosis ([place])   . AKI (acute kidney injury) ([place]) 08/04/2018  . Seizures ([place]) 08/03/2018  . Depression 07/25/2018  . Syncope 01/30/2018  . Orthostatic hypotension 01/24/2018  . Tachycardia 12/28/2017  . Chronic abdominal pain 12/24/2017  . Chest pain 12/19/2017  . Nausea and vomiting 08/21/2017  . Generalized abdominal pain 08/21/2017  . Non compliance with medical treatment 01/27/2012  . Adjustment disorder 09/16/2011  . Acanthosis nigricans   . Goiter   . Obesity 06/14/2010  . Hypertension 06/14/2010   PCP:  Nolene Ebbs, MD Pharmacy:   [id number] [place], [address] [id number]: [phone]: [address]Phone: [phone]: [phone]    Social Determinants of Health (SDOH) Interventions    Readmission Risk Interventions Readmission Risk Prevention Plan 08/08/2019 12/21/2018 12/01/2018  Transportation Screening Complete Complete Complete  PCP or Specialist Appt within 3-5 Days - - -  Social Work Consult for [place] not completed comments - - -  Palliative Care Screening - - -  Medication Review (Press photographer Complete Complete Referral to Pharmacy  PCP or Specialist appointment within 3-5 days of discharge Complete Complete Complete  HRI or Home Care Consult Complete Complete Complete  SW Recovery Care/Counseling Consult Complete Complete Complete  Palliative Care Screening Not Applicable Not Applicable Not [place] Point[place] Applicable Not Applicable Not  Applicable  Some recent data might be hidden

## 2019-08-08 NOTE — Procedures (Signed)
Patient Name: Nancy [name]  MRN: [id number]  Epilepsy Attending: Charlsie Quest  Referring Physician/Provider: Dr. Lacretia Nicks Date: 08/08/2019 Duration: 24.36 mins  Patient history: 19 year old female with history of seizures/nonepileptic events.  EEG to assess for seizures.  Level of alertness: Awake, asleep  AEDs during EEG study: Lamotrigine, topiramate  Technical aspects: This EEG study was done with scalp electrodes positioned according to the 10-20 International system of electrode placement. Electrical activity was acquired at a sampling rate of 500Hz  and reviewed with a high frequency filter of 70Hz  and a low frequency filter of 1Hz . EEG data were recorded continuously and digitally stored.   Description: The posterior dominant rhythm consists of 9 Hz activity of moderate voltage (25-35 uV) seen predominantly in posterior head regions, symmetric and reactive to eye opening and eye closing. Sleep was characterized by vertex waves, sleep spindles (12 to 14 Hz), maximal frontocentral region. Hyperventilation and photic stimulation were not performed.     IMPRESSION: This study is within normal limits. No seizures or epileptiform discharges were seen throughout the recording.  Jamaine Quintin 

## 2019-08-08 NOTE — Progress Notes (Addendum)
Inpatient Diabetes Program Recommendations  AACE/ADA: New Consensus Statement on Inpatient Glycemic Control (2015)  Target Ranges:  Prepandial:   less than 140 mg/dL      Peak postprandial:   less than 180 mg/dL (1-2 hours)      Critically ill patients:  140 - 180 mg/dL   Lab Results  Component Value Date   GLUCAP 155 (H) 08/08/2019   HGBA1C 11.8 (H) 08/06/2019    Review of Glycemic Control  Results for SAMUELLA, RASOOL (MRN [id number]) as of 08/08/2019 10:19  Ref. Range 08/07/2019 07:51 08/07/2019 08:28 08/07/2019 12:10 08/07/2019 16:34 08/08/2019 08:12  Glucose-Capillary Latest Ref Range: 70 - 99 mg/dL 400 (H) 867 (H) 619 (H) 183 (H) 155 (H)   Diabetes history: DM 1 Outpatient Diabetes medications:  Lantus 70 units daily, Novolog 15 units tid with meals Current orders for Inpatient glycemic control: Lantus 50 units q HS, Novolog 10 units tid with meals, Novolog moderate tid with meals and HS Inpatient Diabetes Program Recommendations:    Referral received.  Patient admitted with Nausea and Vomiting and did not take her insulin the day prior to admit.  Blood sugars are currently well controlled in the hospital and A1C is actually improved from March 2021.  Needs to make sure she is following sick day rules/ DKA prevention.    Spoke with patient at bedside. She states that she did take her Lantus when sick but was not taking the Novolog.  We discussed that it is importance to check blood sugars more often when sick with nausea/vomitting.  Also discussed importance of potential need for Novolog q 4 hours if unable to eat and blood sugars elevated.  May need sick day Novolog scale??  Will attach sick day rules to d/c summary.  Discussed with patient also that A1C is improved however not yet at goal of 7%.   Need close f/u with PCP and possible f/u with endocrinologist.  Thanks  Beryl Meager, RN, BC-ADM Inpatient Diabetes Coordinator Pager [phone] (8a-5p)

## 2019-08-08 NOTE — Consult Note (Addendum)
Referring Provider: Dr. Lucianne Muss  Primary Care Physician:  Fleet Contras, MD Primary Gastroenterologist:  Dr. Adela Lank  Reason for Consultation:   Nausea and vomiting   HPI: Nancy [name] is a 19 y.o. female with a past medical history of morbid obesity, anxiety, depression, seizure disorder,  DM type I with frequent ER visits and admissions to the hospital for DKA, nausea, vomiting and abdominal pain.   She was admitted to [facility] on 08/05/2019 with generalized abdominal pain, nausea with hematemesis x 3 days. Prior to her admission she reported  taking a Compazine suppository alternating with a Phenergan suppository Q 6 hrs PRN without improvement. Labs in the ED showed a glucose level of 380.  BUN 11. Cr. 0.57. Normal LFTs. WBC 12.0.  Hg 13.4. She received Protonix, Zofran and Hydromorphone and her symptoms improved therefore she was discharged home.  However, within an hour of returning home her symptoms recurred.  She reported vomiting a moderate amount of red blood without bilious secretions or partially digested food. Repeat labs in the ED showed a pH 7.3.  Anion gap 22.  Glucose 325.  Bicarb 18.  Normal LFTs. Lipase 22. WBC 10.3. Urinalysis with + ketones 80.  Urine positive for opiates and THC.  She received 2 L normal saline IV 2 L, Reglan 10 mg, Haldol 5 mg, Morphine 4 mg, Protonix 40 mg and Phenergan 25 mg with persistent symptoms therefore she was admitted for further management of DKA.  GI consult was requested due to persistent nausea and vomiting with hematemesis.  She was last seen in our office by Dr. Adela Lank on 03/21/2019. At that time she continued to have chronic nausea with vomiting every other day.  She also reported losing 36 pounds over the past 3 to 4 months at that point.  Her cyclic nausea vomiting regimen including Compazine suppositories, Phenergan suppositories, Reglan, Protonix 40 mg twice daily, Pepcid as needed, Remeron 7.5 mg nightly and Buspirone 10  mg 3 times daily.  She noted having hematemesis periodically, she has had 2 EGDs in the past due to to this issue without significant findings.  Reglan and Zofran were discontinued as she stated these medications were no longer effective. Domperidone was discussed but she was reluctant to try it.  She was advised to continue Phenergan, Buspirone, Remeron and FD guard. She underwent an EGD 10/12/2018 which showed candidiasis esophagitis which was treated with Diflucan and mild gastritis. Retained food was identified without evidence of an outlet obstruction. No evidence of a Mallory Weiss tear of etiology for hematemesis. A gastric emptying study was done 04/05/2019 which was normal, no evidence of gastroparesis.  Currently, she continues to feel nauseous.  She was able to eat a solid breakfast this morning without vomiting. I have observed her eating lunch without vomiting. She complains of having upper and lower abdominal pain. She has chronic constipation for which she takes Amitiza tid and Motility bid which results in passing a formed to mushy stool once every 2 weeks. No rectal bleeding or melena. She denies NSAID use. I discussed her urine tox screen was + for THC. She reported taking 2 CBD gummies on one occasion 2 or 3 weeks ago. She adamantly denies smoking marijuana. No other drug use. No alcohol use. History of a seizure disorder on Lamictal and Topamax. Her RN witnessed seizure activity described as generalized shakiness/seizure like activity on 8/1. She was alert and in NAD when the hospitalist was called to her bed side. Maternal  grandfather with history of colon cancer diagnosed in his 53's. Mother with history of ovarian cancer.   Gastric empty study 04/05/2019:  Expected location of the stomach in the left upper quadrant. Ingested meal empties the stomach gradually over the course of the study. 25.2% emptied at 1 hr ( normal >= 10%)  72.8% emptied at 2 hr ( normal >= 40%)  91.2% emptied  at 3 hr ( normal >= 70%)  Normal emptying of greater than 90% after 4 hours post radiotracer administration.  IMPRESSION: Normal gastric emptying study  EGD 10/12/2018 by Dr. Chales Abrahams:  -Mild Candida esophagitis. Treated with Diflucan  po x 1 then  po daily x 2 weeks.  -Mild gastritis. -Some retained food without mechanical outlet obstruction (likely due to recent p.o. intake)  ECHO 12/01/2018: 1. Left ventricular ejection fraction, by visual estimation, is 65 to 70%. The left ventricle has hyperdynamic function. There is mildly increased left ventricular hypertrophy. 2. Left ventricular diastolic parameters are indeterminate, howevere medial annular tissue Doppler suggests normal diastolic function. 3. Global right ventricle has normal systolic function.The right ventricular size is normal. No increase in right ventricular wall thickness.    Past Medical History:  Diagnosis Date  . Acanthosis nigricans   . Anxiety   . CHF (congestive heart failure) (HCC)   . Chronic lower back pain   . Depression   . DKA, type 1 (HCC) 09/13/2018  . Dyspepsia   . Obesity   . Ovarian cyst    pt is not aware of this hx (11/24/2017)  . 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 Premature baby   . Seizures (HCC)   . Type II diabetes mellitus (HCC)    insulin dependant    Past Surgical History:  Procedure Laterality Date  . ABDOMINAL HERNIA REPAIR     "I was a baby"  . BIOPSY  10/12/2018   Procedure: BIOPSY;  Surgeon: Lynann Bologna, MD;  Location: [facility] ENDOSCOPY;  Service: Endoscopy;;  . ESOPHAGOGASTRODUODENOSCOPY (EGD) WITH PROPOFOL N/A 10/12/2018   Procedure: ESOPHAGOGASTRODUODENOSCOPY (EGD) WITH PROPOFOL;  Surgeon: Lynann Bologna, MD;  Location[facility] ENDOSCOPY;  Service: Endoscopy;  Laterality: N/A;  . HERNIA REPAIR    . LEFT HEART CATH AND CORONARY ANGIOGRAPHY N/A 10/13/2018   Procedure: LEFT HEART CATH AND CORONARY ANGIOGRAPHY;  Surgeon: Kathleene Hazel, MD;  Location: [facility] INVASIVE CV  LAB;  Service: Cardiovascular;  Laterality: N/A;  . TONSILLECTOMY AND ADENOIDECTOMY    . WISDOM TOOTH EXTRACTION  2017    Prior to Admission medications   Medication Sig Start Date End Date Taking? Authorizing Provider  atorvastatin (LIPITOR) 20 MG tablet Take 1 tablet (20 mg total) by mouth daily at 6 PM. 10/05/18  Yes Storm Frisk, MD  bisacodyl (DULCOLAX) 10 MG suppository Place 1 suppository (10 mg total) rectally daily. Patient taking differently: Place 10 mg rectally daily as needed for mild constipation.  12/24/18  Yes Swayze, Ava, DO  busPIRone (BUSPAR) 10 MG tablet Take 10 mg by mouth 3 (three) times daily.  11/19/18  Yes [provider]  cycloSPORINE (RESTASIS) 0.05 % ophthalmic emulsion Place 1 drop into both eyes 2 (two) times daily.   Yes [provider]  dicyclomine (BENTYL) 10 MG capsule Take 10 mg by mouth 3 (three) times daily as needed for spasms.  07/05/19  Yes [provider]  hydrOXYzine (ATARAX/VISTARIL) 50 MG tablet Take 1 tablet (50 mg total) by mouth 3 (three) times daily as needed for anxiety. Patient taking differently: Take 50  mg by mouth in the morning and at bedtime.  11/11/18  Yes Nwoko, Nicole Kindred I, NP  insulin aspart (NOVOLOG FLEXPEN) 100 UNIT/ML FlexPen Inject 15 Units into the skin 3 (three) times daily with meals.   Yes [provider]  lamoTRIgine (LAMICTAL) 25 MG tablet Take 2 tablets (50 mg total) by mouth 2 (two) times daily. For mood stabilization 11/11/18  Yes Nwoko, Agnes I, NP  LANTUS SOLOSTAR 100 UNIT/ML Solostar Pen Inject 70 Units into the skin at bedtime.  07/05/19  Yes [provider]  lubiprostone (AMITIZA) 24 MCG capsule Take 1 capsule (24 mcg total) by mouth 2 (two) times daily with a meal. 10/27/18  Yes Esterwood, Amy S, PA-C  mirtazapine (REMERON) 7.5 MG tablet Take 1 tablet (7.5 mg total) by mouth at bedtime. For depression/sleep 11/11/18  Yes Armandina Stammer I, NP  Multiple Vitamin (MULTIVITAMIN WITH  MINERALS) TABS tablet Take 1 tablet by mouth daily. 09/18/18  Yes Clapacs, Jackquline Denmark, MD  Olopatadine HCl (PAZEO) 0.7 % SOLN Place 2 drops into both eyes 2 (two) times daily.    Yes [provider]  pantoprazole (PROTONIX) 40 MG tablet Take 1 tablet (40 mg total) by mouth 2 (two) times daily. For acid reflux 01/29/19  Yes Jae Dire, MD  polyethylene glycol (MIRALAX / GLYCOLAX) 17 g packet Take 17 g by mouth daily. Patient taking differently: Take 17 g by mouth daily as needed for moderate constipation.  12/24/18  Yes Swayze, Ava, DO  prochlorperazine (COMPAZINE) 25 MG suppository Place 1 suppository (25 mg total) rectally every 12 (twelve) hours as needed for nausea or vomiting. 12/05/18  Yes Melene Plan, DO  promethazine (PHENERGAN) 25 MG suppository Place 25 mg rectally every 8 (eight) hours as needed for nausea or vomiting.   Yes [provider]  propranolol (INDERAL) 20 MG tablet Take 1 tablet (20 mg total) by mouth 2 (two) times daily. For anxiety/HTN 12/23/18  Yes Swayze, Ava, DO  Prucalopride Succinate (MOTEGRITY) 2 MG TABS Take 1 tablet (2 mg total) by mouth daily. 03/21/19  Yes Armbruster, Willaim Rayas, MD  QUEtiapine (SEROQUEL) 100 MG tablet Take 300 mg by mouth at bedtime. 07/05/19  Yes [provider]  sucralfate (CARAFATE) 1 GM/10ML suspension Take 1 g by mouth 3 (three) times daily. 07/05/19  Yes [provider]  topiramate (TOPAMAX) 25 MG tablet Take 1 tablet (25 mg total) by mouth 2 (two) times daily. For seizure activities 11/11/18  Yes Nwoko, Nicole Kindred I, NP  Caraway Oil-Levomenthol (FDGARD) 25-20.75 MG CAPS Take 2 capsules by mouth as needed. Take as directed as needed Patient not taking: Reported on 08/05/2019 03/21/19   Benancio Deeds, MD  insulin glargine (LANTUS) 100 UNIT/ML injection Inject 0.6 mLs (60 Units total) into the skin at bedtime. For diabetes management Patient not taking: Reported on 08/05/2019 11/11/18   Armandina Stammer I, NP    Current  Facility-Administered Medications  Medication Dose Route Frequency Provider Last Rate Last Admin  . 0.9 %  sodium chloride infusion   Intravenous Continuous Fayrene Helper, PA-C 75 mL/hr at 08/08/19 0641 New Bag at 08/08/19 0641  . atorvastatin (LIPITOR) tablet 20 mg  20 mg Oral q1800 Kc, Ramesh, MD      . busPIRone (BUSPAR) tablet 10 mg  10 mg Oral TID Lanae Boast, MD   10 mg at 08/08/19 1018  . cycloSPORINE (RESTASIS) 0.05 % ophthalmic emulsion 1 drop  1 drop Both Eyes BID Kc, Ramesh, MD   1 drop  at 08/08/19 1017  . dextrose 5 %-0.45 % sodium chloride infusion   Intravenous Continuous Fayrene Helper, PA-C   Stopped at 08/06/19 2210  . dextrose 50 % solution 0-50 mL  0-50 mL Intravenous PRN Fayrene Helper, PA-C      . dicyclomine (BENTYL) capsule 10 mg  10 mg Oral TID PRN Lanae Boast, MD   10 mg at 08/07/19 0934  . hydrALAZINE (APRESOLINE) injection 10 mg  10 mg Intravenous Q6H PRN Cipriano Bunker, MD   10 mg at 08/07/19 1530  . hydrOXYzine (ATARAX/VISTARIL) tablet 50 mg  50 mg Oral BID PRN Kc, Dayna Barker, MD      . insulin aspart (novoLOG) injection 0-15 Units  0-15 Units Subcutaneous TID WC Lanae Boast, MD   3 Units at 08/08/19 1610  . insulin aspart (novoLOG) injection 0-5 Units  0-5 Units Subcutaneous QHS Kc, Ramesh, MD      . insulin aspart (novoLOG) injection 10 Units  10 Units Subcutaneous TID WC Lanae Boast, MD   10 Units at 08/08/19 0834  . insulin glargine (LANTUS) injection 50 Units  50 Units Subcutaneous QHS Lanae Boast, MD   50 Units at 08/06/19 2048  . lamoTRIgine (LAMICTAL) tablet 50 mg  50 mg Oral BID Kc, Ramesh, MD   50 mg at 08/08/19 1018  . metoCLOPramide (REGLAN) injection 10 mg  10 mg Intravenous Q6H Kc, Ramesh, MD   10 mg at 08/08/19 0531  . mirtazapine (REMERON) tablet 7.5 mg  7.5 mg Oral QHS Kc, Ramesh, MD   7.5 mg at 08/07/19 2152  . ondansetron (ZOFRAN) injection 4 mg  4 mg Intravenous Q6H PRN Kc, Ramesh, MD   4 mg at 08/07/19 1328  . pantoprazole (PROTONIX) injection 40 mg  40 mg  Intravenous Q12H Kc, Ramesh, MD   40 mg at 08/08/19 1016  . potassium chloride 10 mEq in 100 mL IVPB  10 mEq Intravenous Q1 Hr x 3 Cipriano Bunker, MD 100 mL/hr at 08/08/19 1021 10 mEq at 08/08/19 1021  . promethazine (PHENERGAN) injection 6.25 mg  6.25 mg Intravenous Q6H PRN Kc, Ramesh, MD   6.25 mg at 08/07/19 1714  . propranolol (INDERAL) tablet 20 mg  20 mg Oral BID Kc, Ramesh, MD   20 mg at 08/08/19 1018  . Prucalopride Succinate TABS 2 mg  2 mg Oral Daily Kc, Ramesh, MD      . QUEtiapine (SEROQUEL) tablet 300 mg  300 mg Oral QHS Kc, Ramesh, MD   300 mg at 08/07/19 2258  . sucralfate (CARAFATE) 1 GM/10ML suspension 1 g  1 g Oral TID Lanae Boast, MD   1 g at 08/08/19 0836  . topiramate (TOPAMAX) tablet 25 mg  25 mg Oral BID Kc, Ramesh, MD   25 mg at 08/08/19 1018    Allergies as of 08/06/2019 - Review Complete 08/06/2019  Allergen Reaction Noted  . Ibuprofen Other (See Comments) 01/16/2018  . Oatmeal Rash 11/06/2018    Family History  Problem Relation Age of Onset  . Diabetes Mother   . Hypertension Mother   . Obesity Mother   . Asthma Mother   . Allergic rhinitis Mother   . Eczema Mother   . Cervical cancer Mother   . Diabetes Father   . Hypertension Father   . Obesity Father   . Hyperlipidemia Father   . Hypertension Paternal Aunt   . Hypertension Maternal Grandfather   . Colon cancer Maternal Grandfather   . Diabetes Paternal Grandmother   . Obesity  Paternal Grandmother   . Diabetes Paternal Grandfather   . Obesity Paternal Grandfather   . Angioedema Neg Hx   . Immunodeficiency Neg Hx   . Urticaria Neg Hx   . Stomach cancer Neg Hx   . Esophageal cancer Neg Hx     Social History   Socioeconomic History  . Marital status: Single    Spouse name: Not on file  . Number of children: 0  . Years of education: Not on file  . Highest education level: Not on file  Occupational History  . Occupation: Admission  Tobacco Use  . Smoking status: Never Smoker  . Smokeless  tobacco: Never Used  Vaping Use  . Vaping Use: Never used  Substance and Sexual Activity  . Alcohol use: No    Alcohol/week: 0.0 standard drinks  . Drug use: No  . Sexual activity: Not Currently  Other Topics Concern  . Not on file  Social History Narrative   Lives with mom and mom's girlfriend.   No EtOH, tobacco, Drugs   Social Determinants of Health   Financial Resource Strain:   . Difficulty of Paying Living Expenses:   Food Insecurity:   . Worried About Programme researcher, broadcasting/film/video in the Last Year:   . Barista in the Last Year:   Transportation Needs:   . Freight forwarder (Medical):   Marland Kitchen Lack of Transportation (Non-Medical):   Physical Activity:   . Days of Exercise per Week:   . Minutes of Exercise per Session:   Stress:   . Feeling of Stress :   Social Connections:   . Frequency of Communication with Friends and Family:   . Frequency of Social Gatherings with Friends and Family:   . Attends Religious Services:   . Active Member of Clubs or Organizations:   . Attends Banker Meetings:   Marland Kitchen Marital Status:   Intimate Partner Violence:   . Fear of Current or Ex-Partner:   . Emotionally Abused:   Marland Kitchen Physically Abused:   . Sexually Abused:     Review of Systems:  See HPI, all other systems reviewed and are negative   Physical Exam: Vital signs in last 24 hours: Temp:  [97.7 F (36.5 C)-99.6 F (37.6 C)] 98.1 F (36.7 C) (08/02 0911) Pulse Rate:  [37-124] 104 (08/02 0911) Resp:  [11-50] 14 (08/02 0911) BP: (103-175)/(47-141) 130/82 (08/02 0911) SpO2:  [93 %-100 %] 100 % (08/02 0911) Weight:  [97.5 kg] 97.5 kg (08/01 1817) Last BM Date: 08/10/19 General:  Alert obese 19 year old female in NAD. Head:  Normocephalic and atraumatic. Eyes:  No scleral icterus. Conjunctiva pink. Ears:  Normal auditory acuity. Nose:  No deformity, discharge or lesions. Mouth:  Dentition intact. No ulcers or lesions.  Neck:  Supple. No lymphadenopathy or  thyromegaly.  Lungs:  Breath sounds clear throughout.  Heart:  RRR, no murmur.  Abdomen:  Soft, mild tenderness LUQ, RUQ and throughout the lower abdomen without rebound or guarding.  Rectal: Deferred. Musculoskeletal:  Symmetrical without gross deformities.  Pulses:  Normal pulses noted. Extremities:  Without clubbing or edema. Neurologic:  Alert and  oriented x4. No focal deficits.  Skin:  Intact without significant lesions or rashes. Psych:  Alert and cooperative. Normal mood and affect.  Intake/Output from previous day: 08/01 0701 - 08/02 0700 In: 1033.4 [P.O.:120; I.V.:913.4] Out: 500 [Urine:500] Intake/Output this shift: No intake/output data recorded.  Lab Results: Recent Labs    08/05/19 1430 08/05/19  1742 08/06/19 1220 08/06/19 1904 08/07/19 0304 08/07/19 1330 08/08/19 0601  WBC 12.0*  --  10.3  --   --   --  12.2*  HGB 13.4   < > 13.8   < > 11.2* 12.2 11.8*  HCT 43.4   < > 43.5   < > 35.8* 38.7 37.2  PLT 313  --  359  --   --   --  316   < > = values in this interval not displayed.   BMET Recent Labs    08/07/19 0304 08/07/19 1330 08/08/19 0601  NA 138 140 137  K 3.4* 3.6 3.0*  CL 106 106 105  CO2 23 22 23   GLUCOSE 193* 126* 163*  BUN 10 10 9   CREATININE 0.49 0.51 0.53  CALCIUM 8.5* 9.0 8.5*   LFT Recent Labs    08/08/19 0601  PROT 6.0*  ALBUMIN 3.4*  AST 11*  ALT 13  ALKPHOS 71  BILITOT 0.8   PT/INR No results for input(s): LABPROT, INR in the last 72 hours. Hepatitis Panel No results for input(s): HEPBSAG, HCVAB, HEPAIGM, HEPBIGM in the last 72 hours.    Studies/Results: No results found.  IMPRESSION/PLAN:  34. 19 year old female with poorly controlled DM Type I with recurrent N/V and generalized abdominal pain.  Urine THC +.  -Continue Reglan 10mg  IV Q 6 hours for now as nausea/vomiting is well controlled at this time.  -Continue Zofran 4mg  IV Q 6 hrs PRN. -Continue Pantoprazole 40mg  IV bid and Caragate 1 gm po tid -Continue  Dicyclomine 10mg  po tid PRN -Continue IVF @ 75 cc/hr -No THC products advised. She is adamant that she does not smoke marijuana. She digested CBD gummies x 1 about 3 weeks ago.   2. Hypokalemia -KCL replacement per the hospitalist   3. Mild leukocytosis. WBC 12.2. She is afebrile.   4. Mild anemia, most likely dilutional. Admission Hg 13.4 -> 11.8. LMP 08/08/2019.   5. History of seizure disorder. Patient exhibited seizure activity witnessed by her RN on 08/07/2019. -Consider neuro consult during hospitalization  6. Chronic consitpation -Well need outpatient follow up with Dr.   08/08/2019, 11:00 AM   ________________________________________________________________________  GI MD note:  I personally examined the patient, reviewed the data and agree with the assessment and plan described above.  She has recurrent nausea, vomiting and abd pains.  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   I don't believe any of the above tests have really shown much.  I recommend against any further testing and certainly she does not need further GI testing at least.    Probably her most serious medical issue is her very poorly controlled DM and her obesity.    Would treat her N/V and abdominal pain empirically.  Will follow along.  10/07/2019, MD [facility] Pager [phone]

## 2019-08-08 NOTE — Consult Note (Addendum)
Neurology Consultation  Reason for Consult: Possible neuro mediated nausea vomiting Referring Physician: Cipriano Bunker, MD  CC: Nausea vomiting  History is obtained from:  HPI: Nancy [name] is a 19 y.o. female with history of diabetes, seizures, obesity, DKA, congestive heart failure.  Patient is well-known to Dr. Melynda Ripple who is the epileptologist working at [facility].  She was last seen on 01/27/2019.  At that time she was also seen in the ED for diabetic ketoacidosis.  Neurology at that time was consulted for seizures.  As stated by Dr. Candi Leash note patient has had a seizure-like episodes for about 2 years.  These episodes were considered whole body shaking.  Patient was on Lamictal and Topamax for her seizures were with no significant change in her seizures.  Per her note she has had about 20 ED visits since October 2020 with multiple routine EEGs without any epileptiform activity and video EEG monitoring in August 2020 during which the events of her body tremor were recorded without any epileptiform discharges.  Patient was brought to the ER on this occasion secondary to nausea vomiting.  Her mom also noticed seizure-like activity with shaking and tremors at least 20 several episodes throughout the day.  While in the ED patient had ketones in her urinalysis, blood sugar was more than 500 and patient was positive for opiates and THC.  Neurology was consulted for evaluation of possible neuro mediated nausea vomiting along with patient's tremors.  Currently patient is sitting up, on the phone with her friend very comfortable in her bed.  When asked if she was having abdominal pain she suddenly states she had severe 10 out of 10 abdominal pain she states that this occurs 4-5 times a week.  She stated nobody seems to find out what the causes.  When asked about her antiepileptic medications to which she has been on for quite a while she states that she has never had side effects and has been  doing well.  Her antiplatelet drugs do not cause any abdominal pain or nausea vomiting.  She stated that the nausea vomiting can occur with her tremors but not always.   Past Medical History:  Diagnosis Date  . Acanthosis nigricans   . Anxiety   . CHF (congestive heart failure) (HCC)   . Chronic lower back pain   . Depression   . DKA, type 1 (HCC) 09/13/2018  . Dyspepsia   . Obesity   . Ovarian cyst    pt is not aware of this hx (11/24/2017)  . 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 Premature baby   . Seizures (HCC)   . Type II diabetes mellitus (HCC)    insulin dependant    Family History  Problem Relation Age of Onset  . Diabetes Mother   . Hypertension Mother   . Obesity Mother   . Asthma Mother   . Allergic rhinitis Mother   . Eczema Mother   . Cervical cancer Mother   . Diabetes Father   . Hypertension Father   . Obesity Father   . Hyperlipidemia Father   . Hypertension Paternal Aunt   . Hypertension Maternal Grandfather   . Colon cancer Maternal Grandfather   . Diabetes Paternal Grandmother   . Obesity Paternal Grandmother   . Diabetes Paternal Grandfather   . Obesity Paternal Grandfather   . Angioedema Neg Hx   . Immunodeficiency Neg Hx   . Urticaria Neg Hx   . Stomach cancer Neg Hx   .  Esophageal cancer Neg Hx    Social History:   reports that she has never smoked. She has never used smokeless tobacco. She reports that she does not drink alcohol and does not use drugs.  Medications  Current Facility-Administered Medications:  .  0.9 %  sodium chloride infusion, , Intravenous, Continuous, Fayrene Helper, PA-C, Last Rate: 75 mL/hr at 08/08/19 0641, New Bag at 08/08/19 0641 .  atorvastatin (LIPITOR) tablet 20 mg, 20 mg, Oral, q1800, Kc, Ramesh, MD .  busPIRone (BUSPAR) tablet 10 mg, 10 mg, Oral, TID, Kc, Ramesh, MD, 10 mg at 08/08/19 1018 .  cycloSPORINE (RESTASIS) 0.05 % ophthalmic emulsion 1 drop, 1 drop, Both Eyes, BID, Kc, Ramesh, MD, 1 drop at 08/08/19 1017 .   dextrose 5 %-0.45 % sodium chloride infusion, , Intravenous, Continuous, Fayrene Helper, PA-C, Stopped at 08/06/19 2210 .  dextrose 50 % solution 0-50 mL, 0-50 mL, Intravenous, PRN, Fayrene Helper, PA-C .  dicyclomine (BENTYL) capsule 10 mg, 10 mg, Oral, TID PRN, Kc, Ramesh, MD, 10 mg at 08/07/19 0934 .  hydrALAZINE (APRESOLINE) injection 10 mg, 10 mg, Intravenous, Q6H PRN, Cipriano Bunker, MD, 10 mg at 08/07/19 1530 .  hydrOXYzine (ATARAX/VISTARIL) tablet 50 mg, 50 mg, Oral, BID PRN, Kc, Ramesh, MD .  insulin aspart (novoLOG) injection 0-15 Units, 0-15 Units, Subcutaneous, TID WC, Kc, Ramesh, MD, 3 Units at 08/08/19 1311 .  insulin aspart (novoLOG) injection 0-5 Units, 0-5 Units, Subcutaneous, QHS, Kc, Ramesh, MD .  insulin aspart (novoLOG) injection 10 Units, 10 Units, Subcutaneous, TID WC, Kc, Ramesh, MD, 10 Units at 08/08/19 1311 .  insulin glargine (LANTUS) injection 50 Units, 50 Units, Subcutaneous, QHS, Kc, Ramesh, MD, 50 Units at 08/06/19 2048 .  lamoTRIgine (LAMICTAL) tablet 50 mg, 50 mg, Oral, BID, Kc, Ramesh, MD, 50 mg at 08/08/19 1018 .  metoCLOPramide (REGLAN) injection 10 mg, 10 mg, Intravenous, Q6H, Kc, Ramesh, MD, 10 mg at 08/08/19 1240 .  mirtazapine (REMERON) tablet 7.5 mg, 7.5 mg, Oral, QHS, Kc, Ramesh, MD, 7.5 mg at 08/07/19 2152 .  morphine 2 MG/ML injection 2 mg, 2 mg, Intravenous, Q4H PRN, Cipriano Bunker, MD, 2 mg at 08/08/19 1307 .  ondansetron (ZOFRAN) injection 4 mg, 4 mg, Intravenous, Q6H PRN, Kc, Ramesh, MD, 4 mg at 08/07/19 1328 .  pantoprazole (PROTONIX) injection 40 mg, 40 mg, Intravenous, Q12H, Kc, Ramesh, MD, 40 mg at 08/08/19 1016 .  potassium chloride (KLOR-CON) packet 40 mEq, 40 mEq, Oral, Once, Cipriano Bunker, MD .  promethazine (PHENERGAN) injection 6.25 mg, 6.25 mg, Intravenous, Q6H PRN, Kc, Ramesh, MD, 6.25 mg at 08/07/19 1714 .  propranolol (INDERAL) tablet 20 mg, 20 mg, Oral, BID, Kc, Ramesh, MD, 20 mg at 08/08/19 1018 .  Prucalopride Succinate TABS 2 mg, 2 mg,  Oral, Daily, Kc, Ramesh, MD .  QUEtiapine (SEROQUEL) tablet 300 mg, 300 mg, Oral, QHS, Kc, Ramesh, MD, 300 mg at 08/07/19 2258 .  sucralfate (CARAFATE) 1 GM/10ML suspension 1 g, 1 g, Oral, TID, Kc, Ramesh, MD, 1 g at 08/08/19 1240 .  topiramate (TOPAMAX) tablet 25 mg, 25 mg, Oral, BID, Kc, Ramesh, MD, 25 mg at 08/08/19 1018  ROS:    General ROS: negative for - chills, fatigue, fever, night sweats, weight gain or weight loss Psychological ROS: negative for - behavioral disorder, hallucinations, memory difficulties, mood swings or suicidal ideation Ophthalmic ROS: negative for - blurry vision, double vision, eye pain or loss of vision ENT ROS: negative for - epistaxis, nasal discharge, oral lesions, sore throat, tinnitus  or vertigo Allergy and Immunology ROS: negative for - hives or itchy/watery eyes Hematological and Lymphatic ROS: negative for - bleeding problems, bruising or swollen lymph nodes Endocrine ROS: negative for - galactorrhea, hair pattern changes, polydipsia/polyuria or temperature intolerance Respiratory ROS: negative for - cough, hemoptysis, shortness of breath or wheezing Cardiovascular ROS: negative for - chest pain, dyspnea on exertion, edema or irregular heartbeat Gastrointestinal ROS: Positive for - abdominal pain,  nausea/vomiting Genito-Urinary ROS: negative for - dysuria, hematuria, incontinence or urinary frequency/urgency Musculoskeletal ROS: negative for - joint swelling or muscular weakness Neurological ROS: as noted in HPI Dermatological ROS: negative for rash and skin lesion changes  Exam: Current vital signs: BP 134/90 (BP Location: Right Arm)   Pulse 100   Temp 98.9 F (37.2 C) (Oral)   Resp 16   Ht 5\' 3"  (1.6 m)   Wt (!) 97.5 kg   LMP 07/20/2019 (Approximate)   SpO2 100%   BMI 38.09 kg/m  Vital signs in last 24 hours: Temp:  [97.7 F (36.5 C)-99.6 F (37.6 C)] 98.9 F (37.2 C) (08/02 1408) Pulse Rate:  [84-116] 100 (08/02 1408) Resp:  [14-20]  16 (08/02 1408) BP: (103-170)/(47-141) 134/90 (08/02 1408) SpO2:  [98 %-100 %] 100 % (08/02 1408) Weight:  [97.5 kg] 97.5 kg (08/01 1817)   Constitutional: Appears well-developed and well-nourished.  Psych: Affect appropriate to situation Eyes: No scleral injection HENT: No OP obstrucion Head: Normocephalic.  Cardiovascular: Normal rate and regular rhythm.  Respiratory: Effort normal, non-labored breathing GI: Soft.  No distension. There is no tenderness.  Skin: WDI  Neuro: Mental Status: Patient is awake, alert, oriented to person, place, month, year, and situation. Speech-clear with no aphasia or dysarthria.  Naming, repeating, comprehension is intact.  Patient can give a clear history.  Can follow commands with no problems. Cranial Nerves: II: Visual Fields are full.  III,IV, VI: EOMI without ptosis or diploplia. Pupils equal, round and reactive to light V: Facial sensation is symmetric to temperature VII: Facial movement is symmetric.  VIII: hearing is intact to voice X: Palat elevates symmetrically XI: Shoulder shrug is symmetric. XII: tongue is midline without atrophy or fasciculations.  Motor: Tone is normal. Bulk is normal. 5/5 strength was present in all four extremities.  Sensory: Sensation is symmetric to light touch and temperature in the arms and legs. DSS Deep Tendon Reflexes: 2+ and symmetric in the biceps and patellae.  Plantars: Toes are downgoing bilaterally.  Cerebellar: FNF and HKS are intact bilaterally   Labs I have reviewed labs in epic and the results pertinent to this consultation are:   CBC    Component Value Date/Time   WBC 12.2 (H) 08/08/2019 0601   RBC 4.60 08/08/2019 0601   HGB 11.8 (L) 08/08/2019 0601   HGB 12.8 10/05/2018 1133   HCT 37.2 08/08/2019 0601   HCT 40.8 10/05/2018 1133   PLT 316 08/08/2019 0601   PLT 329 10/05/2018 1133   MCV 80.9 08/08/2019 0601   MCV 89 10/05/2018 1133   MCH 25.7 (L) 08/08/2019 0601   MCHC 31.7  08/08/2019 0601   RDW 15.3 08/08/2019 0601   RDW 13.1 10/05/2018 1133   LYMPHSABS 4.0 08/08/2019 0601   LYMPHSABS 2.5 10/05/2018 1133   MONOABS 1.4 (H) 08/08/2019 0601   EOSABS 0.1 08/08/2019 0601   EOSABS 0.1 10/05/2018 1133   BASOSABS 0.0 08/08/2019 0601   BASOSABS 0.1 10/05/2018 1133    CMP     Component Value Date/Time   NA 137  08/08/2019 0601   NA 139 10/05/2018 1133   K 3.0 (L) 08/08/2019 0601   CL 105 08/08/2019 0601   CO2 23 08/08/2019 0601   GLUCOSE 163 (H) 08/08/2019 0601   BUN 9 08/08/2019 0601   BUN 13 10/05/2018 1133   CREATININE 0.53 08/08/2019 0601   CREATININE 0.54 09/18/2012 1150   CALCIUM 8.5 (L) 08/08/2019 0601   PROT 6.0 (L) 08/08/2019 0601   PROT 7.2 10/05/2018 1133   ALBUMIN 3.4 (L) 08/08/2019 0601   ALBUMIN 4.7 10/05/2018 1133   AST 11 (L) 08/08/2019 0601   ALT 13 08/08/2019 0601   ALKPHOS 71 08/08/2019 0601   BILITOT 0.8 08/08/2019 0601   BILITOT <0.2 10/05/2018 1133   GFRNONAA >60 08/08/2019 0601   GFRAA >60 08/08/2019 0601    Lipid Panel     Component Value Date/Time   CHOL 95 11/07/2018 1804   CHOL 190 10/05/2018 1133   TRIG 86 11/07/2018 1804   HDL 52 11/07/2018 1804   HDL 84 10/05/2018 1133   CHOLHDL 1.8 11/07/2018 1804   VLDL 17 11/07/2018 1804   LDLCALC 26 11/07/2018 1804   LDLCALC 72 10/05/2018 1133     Imaging EEG-study is within normal limits.  No seizures or epileptiform discharge were seen throughout the recording.  Felicie Morn PA-C [facility] [phone]  M-F  (9:00 am- 5:00 PM)  08/08/2019, 3:20 PM   I have seen the patient and reviewed the above note.  Assessment: This is a 19 year old female who has been seen by Dr. Melynda Ripple multiple times for seizure-like episodes.  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  She has been dealing with abdominal pain at least 4-5 times a week for the past couple years.  She has a normal exam  from a neuro standpoint.  She also states that she has been on her antiepileptic medications for at least 3 years and has had no difficulties or side effects with these medications.    With neuro mediated nausea vomiting, abdominal pain is not common component.  She does not get nausea with her migraines and I think it is very low likelihood that any neurological work-up would yield an answer for this.   Impression: -Abdominal pain  Recommendations: -Continue home doses of antiepileptics. -Continue seizure precautions while in the hospital. -Neurology will be is available on an as-needed basis moving forward.  Ritta Slot, MD [facility] [phone]  If 7pm- 7am, please page neurology on call as listed in AMION.

## 2019-08-08 NOTE — Progress Notes (Signed)
EEG complete - results pending 

## 2019-08-08 NOTE — Progress Notes (Signed)
PROGRESS NOTE    Nancy [name]  OEV:[id number] DOB: 05-05-2000 DOA: 08/06/2019   PCP: Fleet Contras, MD    Brief Narrative:  Nancy [name] is a 19 y.o. 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 Patient was seen in the ED yesterday with abdominal pain, nausea, vomiting and was discharged home.  After within an hour returning as her symptoms return at home with persistent nausea vomiting.  Blood in vomitus several cups of emesis,  no streaks.  Her mom also noticed seizure-like activity with shaking and tremors at least 20 separate episodes throughout the day.  Patient is unable to take her medication or hydrate herself so came to the ER. She did not take her insulin yesterday, she reports seeing Dr. Adela Lank from GI for recurrent nausea and vomiting and had gastric emptying test in the past.  She was Hypertensive,  tachycardic up to 140s, saturating well on room air.  Blood work shows pH 7.3, anion gap 22 with blood sugar 325, bicarb 18, normal LFTs and lipase, elevated beta hydroxybutyric acid at 4.2, abnormal UA with ketones 80, blood sugar more than 500, positive for opiates and THC.   She was admitted for diabetic ketoacidosis, anion gap closed, insulin drip discontinued and started on subcu insulin.  She continues to remain nauseous and throwing up.  Gastroenterology consulted, awaiting recommendation.  Patient has an episode of shaking and tremors, EEG ordered, results pending  Assessment & Plan:   Principal Problem:   Type 1 diabetes mellitus with ketoacidosis without coma (HCC) Active Problems:   Obesity   Hypertension   Non compliance with medical treatment   Pseudoseizures   Dehydration   DKA (diabetic ketoacidoses) (HCC)  DKA with poorly controlled  insulin-dependent diabetes:  >>> Resolved. triggered by patient's intractable nausea and vomiting.  Did not take insulin last night.  Patient has been started on insulin drip,  will continue same , serial BMP and every hour Accu-Chek as per endocrine and DKA protocol with iv fluid hydration, once anion gap closes, start diet and and switch to long-acting insulin home regimen along with sliding scale/Premeal insulin after making 2-hour overlap with the IV -sq insulin. Anion gap closed, transitioned to subcu insulin. Blood glucose improved.  Intractable nausea and vomiting:  She has had similar prior presentation and was seen by Dr. Clydene Pugh from GI and had gastric emptying study. CT preliminary.  Repeat gastric emptying study at some point and patient was outpatient.  We will keep on as needed Zofran, Phenergan  and a scheduled Reglan, IV Protonix Q12H. Patient reports blood in the vomitus and noted that she has had similar previous episodes. We will avoid Lovenox/heparin. She continues to have intractable nausea and vomiting,  not able to tolerate food,  GI consulted and will follow up recommendation.  Dehydration DKA with tachycardia. Continue IV hydration aggressively as above.  Type 1 diabetes mellitus with ketoacidosis without coma : Patient takes 70 units Lantus at bedtime and 15 units Humalog premeal 3 times a day.  last hba1c was 13.4 in February/21.  Continue Lantus and insulin sliding scale.  Tremor/shaking episodes multiple times  Has had similar previous presentation.  Continue Lamictal/Topamax. She had shaking episode and tremors last night EEG is ordered.  Consider neurology if EEG abnormal.  Anxiety/depression-resumed  home meds.  Noncompliance/positive for THC and opiates in the urine.  Counseling completed.  Morbid obesity with BMI 38, will benefit with weight loss and healthy lifestyle.  Hypertension: Blood pressure on higher side,  resumed home meds.   DVT  prophylaxis: SCDs, Ambulatory Code Status: Full Family Communication: No one at bed side. Disposition Plan: Anticipated discharge  Home Once N/V resolves. Anticipated discharge home in 1 to 2 days  She is not medically clear due to the ongoing intractable nausea and vomiting requiring GI evaluation, pending EEG  Consultants:   None.  Procedures: None.  Antimicrobials:  Anti-infectives (From admission, onward)   None     Subjective: Patient was seen and examined at bedside.  She continues to be remain nauseous and throwing up.  She also reports having abdominal pain.   Overnight events noted.  She had an episode of shaking and tremors.  EEG ordered and  pending  Objective: Vitals:   08/07/19 2258 08/08/19 0136 08/08/19 0517 08/08/19 0911  BP: (!) 157/100 (!) 103/53 122/76 (!) 130/82  Pulse: 97 84 90 104  Resp:  15 16 14   Temp:  97.7 F (36.5 C) 98.3 F (36.8 C) 98.1 F (36.7 C)  TempSrc:  Oral Oral Oral  SpO2:  98% 100% 100%  Weight:      Height:        Intake/Output Summary (Last 24 hours) at 08/08/2019 1212 Last data filed at 08/08/2019 0615 Gross per 24 hour  Intake 1033.39 ml  Output 500 ml  Net 533.39 ml   Filed Weights   08/07/19 1817  Weight: (!) 97.5 kg    Examination:  General exam: Appears calm and comfortable  Respiratory system: Clear to auscultation. Respiratory effort normal. Cardiovascular system: S1 & S2 heard, RRR. No JVD, murmurs, rubs, gallops or clicks. No pedal edema. Gastrointestinal system: Abdomen is nondistended, soft and nontender. No organomegaly or masses felt. Normal bowel sounds heard. Central nervous system: Alert and oriented. No focal neurological deficits. Extremities: No swelling, No cyanosis. Skin: No rashes, lesions or ulcers Psychiatry: Judgement and insight appear normal. Mood & affect appropriate.     Data Reviewed: I have personally reviewed following labs and imaging studies.  CBC: Recent Labs  Lab 08/05/19 1430  08/05/19 1742 08/06/19 1220 08/06/19 1904 08/07/19 0304 08/07/19 1330 08/08/19 0601  WBC 12.0*  --  10.3  --   --   --  12.2*  NEUTROABS  --   --  8.0*  --   --   --  6.7  HGB 13.4   < > 13.8 11.8* 11.2* 12.2 11.8*  HCT 43.4   < > 43.5 38.3 35.8* 38.7 37.2  MCV 81.1  --  80.6  --   --   --  80.9  PLT 313  --  359  --   --   --  316   < > = values in this interval not displayed.   Basic Metabolic Panel: Recent Labs  Lab 08/06/19 1904 08/06/19 2345 08/07/19 0304 08/07/19 1330 08/08/19 0601  NA 141 139 138 140 137  K 3.8 3.4* 3.4* 3.6 3.0*  CL 109 106 106 106 105  CO2 21* 22 23 22 23   GLUCOSE 154* 157* 193* 126* 163*  BUN 9 9 10 10 9   CREATININE 0.43* 0.50 0.49 0.51 0.53  CALCIUM 8.4* 8.3* 8.5* 9.0 8.5*   GFR: Estimated Creatinine Clearance: 123.6 mL/min (by C-G formula based on SCr of 0.53 mg/dL). Liver Function Tests: Recent Labs  Lab 08/05/19 1430 08/06/19 1220 08/08/19 0601  AST 14*  17 11*  ALT 16 14 13   ALKPHOS 98 103 71  BILITOT 0.5 1.2 0.8  PROT 8.1 8.4* 6.0*  ALBUMIN 4.7 4.7 3.4*   Recent Labs  Lab 08/06/19 1220  LIPASE 22   No results for input(s): AMMONIA in the last 168 hours. Coagulation Profile: No results for input(s): INR, PROTIME in the last 168 hours. Cardiac Enzymes: No results for input(s): CKTOTAL, CKMB, CKMBINDEX, TROPONINI in the last 168 hours. BNP (last 3 results) No results for input(s): PROBNP in the last 8760 hours. HbA1C: Recent Labs    08/06/19 1710  HGBA1C 11.8*   CBG: Recent Labs  Lab 08/07/19 0751 08/07/19 0828 08/07/19 1210 08/07/19 1634 08/08/19 0812  GLUCAP 142* 151* 133* 183* 155*   Lipid Profile: No results for input(s): CHOL, HDL, LDLCALC, TRIG, CHOLHDL, LDLDIRECT in the last 72 hours. Thyroid Function Tests: No results for input(s): TSH, T4TOTAL, FREET4, T3FREE, THYROIDAB in the last 72 hours. Anemia Panel: No results for input(s): VITAMINB12, FOLATE, FERRITIN, TIBC, IRON, RETICCTPCT in the last 72  hours. Sepsis Labs: No results for input(s): PROCALCITON, LATICACIDVEN in the last 168 hours.  Recent Results (from the past 240 hour(s))  SARS Coronavirus 2 by RT PCR (hospital order, performed in [facility] hospital lab) Nasopharyngeal Nasopharyngeal Swab     Status: None   Collection Time: 08/06/19  2:52 PM   Specimen: Nasopharyngeal Swab  Result Value Ref Range Status   SARS Coronavirus 2 NEGATIVE NEGATIVE Final    Comment: (NOTE) SARS-CoV-2 target nucleic acids are NOT DETECTED.  The SARS-CoV-2 RNA is generally detectable in upper and lower respiratory specimens during the acute phase of infection. The lowest concentration of SARS-CoV-2 viral copies this assay can detect is 250 copies / mL. A negative result does not preclude SARS-CoV-2 infection and should not be used as the sole basis for treatment or other patient management decisions.  A negative result may occur with improper specimen collection / handling, submission of specimen other than nasopharyngeal swab, presence of viral mutation(s) within the areas targeted by this assay, and inadequate number of viral copies (<250 copies / mL). A negative result must be combined with clinical observations, patient history, and epidemiological information.  Fact Sheet for Patients:   08/08/19  Fact Sheet for Healthcare Providers: BoilerBrush.com.cy  This test is not yet approved or  cleared by the https://pope.com/ FDA and has been authorized for detection and/or diagnosis of SARS-CoV-2 by FDA under an Emergency Use Authorization (EUA).  This EUA will remain in effect (meaning this test can be used) for the duration of the COVID-19 declaration under Section 564(b)(1) of the Act, 21 U.S.C. section 360bbb-3(b)(1), unless the authorization is terminated or revoked sooner.  Performed at [facility], [address], [place], [place]       Radiology Studies: No results found.   Scheduled Meds: . atorvastatin  20 mg Oral q1800  . busPIRone  10 mg Oral TID  . cycloSPORINE  1 drop Both Eyes BID  . insulin aspart  0-15 Units Subcutaneous TID WC  . insulin aspart  0-5 Units Subcutaneous QHS  . insulin aspart  10 Units Subcutaneous TID WC  . insulin glargine  50 Units Subcutaneous QHS  . lamoTRIgine  50 mg Oral BID  . metoCLOPramide (REGLAN) injection  10 mg Intravenous Q6H  . mirtazapine  7.5 mg Oral QHS  . pantoprazole (PROTONIX) IV  40 mg Intravenous Q12H  . propranolol  20 mg Oral BID  .  Prucalopride Succinate  2 mg Oral Daily  . QUEtiapine  300 mg Oral QHS  . sucralfate  1 g Oral TID  . topiramate  25 mg Oral BID   Continuous Infusions: . sodium chloride 75 mL/hr at 08/08/19 0641  . dextrose 5 % and 0.45% NaCl Stopped (08/06/19 2210)     LOS: 1 day    Time spent: 25 mins.    Cipriano Bunker, MD [facility]   If 7PM-7AM, please contact night-coverage

## 2019-08-09 LAB — CBC
HCT: 36.6 % (ref 36.0–46.0)
Hemoglobin: 11.7 g/dL — ABNORMAL LOW (ref 12.0–15.0)
MCH: 26 pg (ref 26.0–34.0)
MCHC: 32 g/dL (ref 30.0–36.0)
MCV: 81.3 fL (ref 80.0–100.0)
Platelets: 324 10*3/uL (ref 150–400)
RBC: 4.5 MIL/uL (ref 3.87–5.11)
RDW: 15 % (ref 11.5–15.5)
WBC: 12.2 10*3/uL — ABNORMAL HIGH (ref 4.0–10.5)
nRBC: 0 % (ref 0.0–0.2)

## 2019-08-09 LAB — BASIC METABOLIC PANEL
Anion gap: 7 (ref 5–15)
BUN: 8 mg/dL (ref 6–20)
CO2: 24 mmol/L (ref 22–32)
Calcium: 8.3 mg/dL — ABNORMAL LOW (ref 8.9–10.3)
Chloride: 105 mmol/L (ref 98–111)
Creatinine, Ser: 0.51 mg/dL (ref 0.44–1.00)
GFR calc Af Amer: >60 mL/min (ref >60)
GFR calc non Af Amer: >60 mL/min (ref >60)
Glucose, Bld: 207 mg/dL — ABNORMAL HIGH (ref 70–99)
Potassium: 3.6 mmol/L (ref 3.5–5.1)
Sodium: 136 mmol/L (ref 135–145)

## 2019-08-09 LAB — TSH: TSH: 0.244 u[IU]/mL — ABNORMAL LOW (ref 0.350–4.500)

## 2019-08-09 LAB — GLUCOSE, CAPILLARY
Glucose-Capillary: 480 mg/dL — ABNORMAL HIGH (ref 70–99)
Glucose-Capillary: 480 mg/dL — ABNORMAL HIGH (ref 70–99)

## 2019-08-09 LAB — T4, FREE: Free T4: 1.18 ng/dL — ABNORMAL HIGH (ref 0.61–1.12)

## 2019-08-09 MED ORDER — INSULIN ASPART 100 UNIT/ML ~~LOC~~ SOLN: 0.0000 [IU] | Freq: Three times a day (TID) | SUBCUTANEOUS | Status: DC

## 2019-08-09 MED ORDER — SODIUM CHLORIDE 0.9 % IV SOLN: INTRAVENOUS | Status: DC

## 2019-08-09 MED ORDER — INSULIN ASPART 100 UNIT/ML ~~LOC~~ SOLN
9.0000 [IU] | Freq: Once | SUBCUTANEOUS | Status: AC
  Administered 2019-08-09: 09:00:00 9 [IU] via SUBCUTANEOUS

## 2019-08-09 NOTE — Progress Notes (Signed)
Repeat CBG 181, MD updated.

## 2019-08-09 NOTE — Progress Notes (Addendum)
Progress Note   Subjective  Chief Complaint: Nausea, vomiting, abdominal pain and constipation  Patient tells me that yesterday nothing she ate stayed down, within 5 minutes she was vomiting and continued to do so.  Describes that she ate breakfast though this morning around 830 and has had no vomiting.  Her abdominal pain is constant but this is not new for her.  Discusses her constipation and tells me she may have 1 or 2 bowel movements a month.  This is regardless of her Linzess to 90 mcg daily and Movantik.  She has tried multiple other products in the past.   Objective   Vital signs in last 24 hours: Temp:  [97.4 F (36.3 C)-98.9 F (37.2 C)] 98.1 F (36.7 C) (08/03 0948) Pulse Rate:  [81-100] 91 (08/03 0948) Resp:  [15-16] 16 (08/03 0948) BP: (121-163)/(76-114) 121/76 (08/03 0948) SpO2:  [100 %] 100 % (08/03 0948) Last BM Date: 08/03/19 General:    AA female in NAD Heart:  Regular rate and rhythm; no murmurs Lungs: Respirations even and unlabored, lungs CTA bilaterally Abdomen:  Soft, mild generalized ttp and nondistended. Normal bowel sounds. Extremities:  Without edema. Neurologic:  Alert and oriented,  grossly normal neurologically. Psych:  Cooperative. Normal mood and affect.  Intake/Output from previous day: 08/02 0701 - 08/03 0700 In: 1908.9 [P.O.:420; I.V.:1488.9] Out: -   Lab Results: Recent Labs    08/06/19 1220 08/06/19 1904 08/07/19 1330 08/08/19 0601 08/09/19 0509  WBC 10.3  --   --  12.2* 12.2*  HGB 13.8   < > 12.2 11.8* 11.7*  HCT 43.5   < > 38.7 37.2 36.6  PLT 359  --   --  316 324   < > = values in this interval not displayed.   BMET Recent Labs    08/07/19 1330 08/08/19 0601 08/09/19 0509  NA 140 137 136  K 3.6 3.0* 3.6  CL 106 105 105  CO2 22 23 24   GLUCOSE 126* 163* 207*  BUN 10 9 8   CREATININE 0.51 0.53 0.51  CALCIUM 9.0 8.5* 8.3*   LFT Recent Labs    08/08/19 0601  PROT 6.0*  ALBUMIN 3.4*  AST 11*  ALT 13  ALKPHOS 71   BILITOT 0.8    Studies/Results: EEG  Result Date: 08/08/2019 10/08/19, MD     08/08/2019  4:35 PM Patient Name: Nancy [name] MRN: [id number] Epilepsy Attending: [name] Referring Physician/Provider: Dr. [id number] Date: 08/08/2019 Duration: 24.36 mins Patient history: 19 year old female with history of seizures/nonepileptic events.  EEG to assess for seizures. Level of alertness: Awake, asleep AEDs during EEG study: Lamotrigine, topiramate Technical aspects: This EEG study was done with scalp electrodes positioned according to the 10-20 International system of electrode placement. Electrical activity was acquired at a sampling rate of 500Hz  and reviewed with a high frequency filter of 70Hz  and a low frequency filter of 1Hz . EEG data were recorded continuously and digitally stored. Description: The posterior dominant rhythm consists of 9 Hz activity of moderate voltage (25-35 uV) seen predominantly in posterior head regions, symmetric and reactive to eye opening and eye closing. Sleep was characterized by vertex waves, sleep spindles (12 to 14 Hz), maximal frontocentral region. Hyperventilation and photic stimulation were not performed.   IMPRESSION: This study is within normal limits. No seizures or epileptiform discharges were seen throughout the recording. Nancy [name] 10/08/2019    Assessment / Plan:   Assessment: 1.  Recurrent nausea/vomiting and  generalized abdominal pain: Uncertain etiology, but has had extensive work-up; recommendations for better diabetic control and decrease in weight 2.  Chronic constipation: 1-2 bowel movements a month per patient, regardless of a lot of different products 3.  History of a seizure disorder 4.  Hypokalemia: Corrected  Plan: 1.  As previously discussed by Dr. 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  It is not felt that she would benefit from further GI testing at this time. 2.  Patient was able to tolerate breakfast this morning.  Continue current supportive measures. 3.  Please await any further recommendations from Dr. Christella Hartigan later today, we will likely sign off.  Thank you for your kind consultation.   LOS: 2 days   Unk Lightning  08/09/2019, 10:57 AM  ________________________________________________________________________  [facility] MD note:  I personally reviewed the data and agree with the assessment and plan described above. She had already left the hospital before I was able to see her on rounds this afternoon (2pm now).     Rob Bunting, MD [facility] Pager [phone]

## 2019-08-10 LAB — T3: T3, Total: 78 ng/dL (ref 71–180)

## 2019-08-10 NOTE — Hospital Course (Signed)
Nancy [name] is a 19 y.o. 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   Patient was seen in the ED with abdominal pain, nausea, vomiting and was discharged home.  She developed recurrent nausea and vomiting upon returning home and presented back to the ER.   Her mom also noticed seizure-like activity with shaking and tremors at least 20 separate episodes throughout the day.  Patient is unable to take her medication or hydrate herself so came to the ER. She did not take her insulin yesterday, she reports seeing Dr. Adela Lank from GI for recurrent nausea and vomiting and had gastric emptying test in the past.  She was Hypertensive,  tachycardic up to 140s, saturating well on room air.  Blood work shows pH 7.3, anion gap 22 with blood sugar 325, bicarb 18, normal LFTs and lipase, elevated beta hydroxybutyric acid at 4.2, abnormal UA with ketones 80, blood sugar more than 500, positive for opiates and THC.   She was admitted for diabetic ketoacidosis. Her anion gap closed, insulin drip discontinued and started on subcu insulin. Neurology was also consulted on admission and patient underwent EEG which was considered normal with no seizure activity noted.  She was recommended to continue on her home dose of antiepileptics.

## 2019-08-10 NOTE — Discharge Summary (Signed)
Physician Discharge Summary  Nancy [name] WVP:[id number] DOB: 03/10/2000 DOA: 08/06/2019  PCP: Fleet Contras, MD  Admit date: 08/06/2019 Discharge date: 08/10/2019  Admitted From: Home Disposition: Home Discharging physician: Lewie Chamber, MD  Recommendations for Outpatient Follow-up:  1. Consider referral to endocrinology  Patient discharged to home in Discharge Condition: stable CODE STATUS: Full Diet recommendation:  Diet Orders (From admission, onward)    Start     Ordered   08/09/19 0000  Diet Carb Modified        08/09/19 1311          Hospital Course: Nancy [name] is a 19 y.o. 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   Patient was seen in the ED with abdominal pain, nausea, vomiting and was discharged home.  She developed recurrent nausea and vomiting upon returning home and presented back to the ER.   Her mom also noticed seizure-like activity with shaking and tremors at least 20 separate episodes throughout the day.  Patient is unable to take her medication or hydrate herself so came to the ER. She did not take her insulin yesterday, she reports seeing Dr. Adela Lank from GI for recurrent nausea and vomiting and had gastric emptying test in the past.  She was Hypertensive,  tachycardic up to 140s, saturating well on room air.  Blood work shows pH 7.3, anion gap 22 with blood sugar 325, bicarb 18, normal LFTs and lipase, elevated beta hydroxybutyric acid at 4.2, abnormal UA with ketones 80, blood sugar more than 500, positive for opiates and THC.   She was admitted for diabetic ketoacidosis. Her anion gap closed, insulin drip discontinued and started on subcu insulin. Neurology was also consulted on admission and patient underwent EEG which was  considered normal with no seizure activity noted.  She was recommended to continue on her home dose of antiepileptics.   The patient's chronic medical conditions were treated accordingly per the patient's home medication regimen except as noted.  On day of discharge, patient was felt deemed stable for discharge. Patient/family member advised to call PCP or come back to ER if needed.   Discharge Diagnoses:   Principal Diagnosis: Type 1 diabetes mellitus with ketoacidosis without coma [facility])  Active Hospital Problems   Diagnosis Date Noted  . Type 1 diabetes mellitus with ketoacidosis without coma (HCC) 11/28/2018  . DKA (diabetic ketoacidoses) (HCC) 01/27/2019  . Dehydration   . Pseudoseizures   . Non compliance with medical treatment 01/27/2012  . Obesity 06/14/2010  . Hypertension 06/14/2010    Resolved Hospital Problems  No resolved problems to display.    Discharge Instructions    Diet Carb Modified   Complete by: As directed    Increase activity slowly   Complete by: As directed      Allergies as of 08/09/2019      Reactions   Ibuprofen Other (See Comments)   GI MD said to not take this anymore   Oatmeal Rash      Medication List    TAKE these medications   atorvastatin 20 MG tablet Commonly known as: LIPITOR Take 1 tablet (20 mg total) by mouth daily at 6 PM.   bisacodyl 10 MG suppository Commonly known as: DULCOLAX Place 1 suppository (10 mg total) rectally daily. What changed:   when to take this  reasons to take this  busPIRone 10 MG tablet Commonly known as: BUSPAR Take 10 mg by mouth 3 (three) times daily.   cycloSPORINE 0.05 % ophthalmic emulsion Commonly known as: RESTASIS Place 1 drop into both eyes 2 (two) times daily.   dicyclomine 10 MG capsule Commonly known as: BENTYL Take 10 mg by mouth 3 (three) times daily as needed for spasms.   FDgard 25-20.75 MG Caps Generic drug: [place] Oil-Levomenthol Take 2 capsules by mouth as needed. Take  as directed as needed   hydrOXYzine 50 MG tablet Commonly known as: ATARAX/VISTARIL Take 1 tablet (50 mg total) by mouth 3 (three) times daily as needed for anxiety. What changed: when to take this   lamoTRIgine 25 MG tablet Commonly known as: LAMICTAL Take 2 tablets (50 mg total) by mouth 2 (two) times daily. For mood stabilization   Lantus SoloStar 100 UNIT/ML Solostar Pen Generic drug: insulin glargine Inject 70 Units into the skin at bedtime. What changed: Another medication with the same name was removed. Continue taking this medication, and follow the directions you see here.   lubiprostone 24 MCG capsule Commonly known as: AMITIZA Take 1 capsule (24 mcg total) by mouth 2 (two) times daily with a meal.   mirtazapine 7.5 MG tablet Commonly known as: REMERON Take 1 tablet (7.5 mg total) by mouth at bedtime. For depression/sleep   Motegrity 2 MG Tabs Generic drug: Prucalopride Succinate Take 1 tablet (2 mg total) by mouth daily.   multivitamin with minerals Tabs tablet Take 1 tablet by mouth daily.   NovoLOG FlexPen 100 UNIT/ML FlexPen Generic drug: insulin aspart Inject 15 Units into the skin 3 (three) times daily with meals.   pantoprazole 40 MG tablet Commonly known as: PROTONIX Take 1 tablet (40 mg total) by mouth 2 (two) times daily. For acid reflux   Pazeo 0.7 % Soln Generic drug: Olopatadine HCl Place 2 drops into both eyes 2 (two) times daily.   polyethylene glycol 17 g packet Commonly known as: MIRALAX / GLYCOLAX Take 17 g by mouth daily. What changed:   when to take this  reasons to take this   prochlorperazine 25 MG suppository Commonly known as: COMPAZINE Place 1 suppository (25 mg total) rectally every 12 (twelve) hours as needed for nausea or vomiting.   promethazine 25 MG suppository Commonly known as: PHENERGAN Place 25 mg rectally every 8 (eight) hours as needed for nausea or vomiting.   propranolol 20 MG tablet Commonly known as:  INDERAL Take 1 tablet (20 mg total) by mouth 2 (two) times daily. For anxiety/HTN   QUEtiapine 100 MG tablet Commonly known as: SEROQUEL Take 300 mg by mouth at bedtime.   sucralfate 1 GM/10ML suspension Commonly known as: CARAFATE Take 1 g by mouth 3 (three) times daily.   topiramate 25 MG tablet Commonly known as: TOPAMAX Take 1 tablet (25 mg total) by mouth 2 (two) times daily. For seizure activities       Allergies  Allergen Reactions  . Ibuprofen Other (See Comments)    GI MD said to not take this anymore  . Oatmeal Rash    Discharge Exam: BP 121/76 (BP Location: Right Arm)   Pulse 91   Temp 98.1 F (36.7 C)   Resp 16   Ht  (1.6 m)   Wt (!) 97.5 kg   LMP 07/20/2019 (Approximate)   SpO2 100%   BMI 38.09 kg/m  General appearance: alert, cooperative and no distress Head: Normocephalic, without obvious abnormality, atraumatic Eyes: EOMI Lungs: clear  to auscultation bilaterally Heart: regular rate and rhythm and S1, S2 normal Abdomen: normal findings: bowel sounds normal and soft, non-tender Extremities: no edema Skin: mobility and turgor normal Neurologic: Grossly normal   The results of significant diagnostics from this hospitalization (including imaging, microbiology, ancillary and laboratory) are listed below for reference.   Microbiology: Recent Results (from the past 240 hour(s))  SARS Coronavirus 2 by RT PCR (hospital order, performed in [facility] hospital lab) Nasopharyngeal Nasopharyngeal Swab     Status: None   Collection Time: 08/06/19  2:52 PM   Specimen: Nasopharyngeal Swab  Result Value Ref Range Status   SARS Coronavirus 2 NEGATIVE NEGATIVE Final    Comment: (NOTE) SARS-CoV-2 target nucleic acids are NOT DETECTED.  The SARS-CoV-2 RNA is generally detectable in upper and lower respiratory specimens during the acute phase of infection. The lowest concentration of SARS-CoV-2 viral copies this assay can detect is 250 copies / mL. A  negative result does not preclude SARS-CoV-2 infection and should not be used as the sole basis for treatment or other patient management decisions.  A negative result may occur with improper specimen collection / handling, submission of specimen other than nasopharyngeal swab, presence of viral mutation(s) within the areas targeted by this assay, and inadequate number of viral copies (<250 copies / mL). A negative result must be combined with clinical observations, patient history, and epidemiological information.  Fact Sheet for Patients:   BoilerBrush.com.cy  Fact Sheet for Healthcare Providers: https://pope.com/  This test is not yet approved or  cleared by the [place] FDA and has been authorized for detection and/or diagnosis of SARS-CoV-2 by FDA under an Emergency Use Authorization (EUA).  This EUA will remain in effect (meaning this test can be used) for the duration of the COVID-19 declaration under Section 564(b)(1) of the Act, 21 U.S.C. section 360bbb-3(b)(1), unless the authorization is terminated or revoked sooner.  Performed at [facility], [address] [place], [place] [address]      Labs: BNP (last 3 results) Recent Labs    09/10/18 1424 09/12/18 2244  BNP 13.1 13.8   Basic Metabolic Panel: Recent Labs  Lab 08/06/19 2345 08/07/19 0304 08/07/19 1330 08/08/19 0601 08/09/19 0509  NA 139 138 140 137 136  K 3.4* 3.4* 3.6 3.0* 3.6  CL 106 106 106 105 105  CO2 22 23 22 23 24   GLUCOSE 157* 193* 126* 163* 207*  BUN 9 10 10 9 8   CREATININE 0.50 0.49 0.51 0.53 0.51  CALCIUM 8.3* 8.5* 9.0 8.5* 8.3*   Liver Function Tests: Recent Labs  Lab 08/05/19 1430 08/06/19 1220 08/08/19 0601  AST 14* 17 11*  ALT 16 14 13   ALKPHOS 98 103 71  BILITOT 0.5 1.2 0.8  PROT 8.1 8.4* 6.0*  ALBUMIN 4.7 4.7 3.4*   Recent Labs  Lab 08/06/19 1220  LIPASE 22   No results for input(s): AMMONIA  in the last 168 hours. CBC: Recent Labs  Lab 08/05/19 1430 08/05/19 1742 08/06/19 1220 08/06/19 1220 08/06/19 1904 08/07/19 0304 08/07/19 1330 08/08/19 0601 08/09/19 0509  WBC 12.0*  --  10.3  --   --   --   --  12.2* 12.2*  NEUTROABS  --   --  8.0*  --   --   --   --  6.7  --   HGB 13.4   < > 13.8   < > 11.8* 11.2* 12.2 11.8* 11.7*  HCT 43.4   < >  43.5   < > 38.3 35.8* 38.7 37.2 36.6  MCV 81.1  --  80.6  --   --   --   --  80.9 81.3  PLT 313  --  359  --   --   --   --  316 324   < > = values in this interval not displayed.   Cardiac Enzymes: No results for input(s): CKTOTAL, CKMB, CKMBINDEX, TROPONINI in the last 168 hours. BNP: Invalid input(s): POCBNP CBG: Recent Labs  Lab 08/07/19 1634 08/08/19 0812 08/08/19 1631 08/08/19 1837 08/09/19 0732  GLUCAP 183* 155* 193* 226* 480*  480*   D-Dimer No results for input(s): DDIMER in the last 72 hours. Hgb A1c No results for input(s): HGBA1C in the last 72 hours. Lipid Profile No results for input(s): CHOL, HDL, LDLCALC, TRIG, CHOLHDL, LDLDIRECT in the last 72 hours. Thyroid function studies Recent Labs    08/09/19 0509  TSH 0.244*   Anemia work up No results for input(s): VITAMINB12, FOLATE, FERRITIN, TIBC, IRON, RETICCTPCT in the last 72 hours. Urinalysis    Component Value Date/Time   COLORURINE YELLOW 08/06/2019 1413   APPEARANCEUR CLEAR 08/06/2019 1413   LABSPEC 1.035 (H) 08/06/2019 1413   PHURINE 5.0 08/06/2019 1413   GLUCOSEU >=500 (A) 08/06/2019 1413   HGBUR NEGATIVE 08/06/2019 1413   BILIRUBINUR NEGATIVE 08/06/2019 1413   BILIRUBINUR negative 07/13/2018 1651   KETONESUR 80 (A) 08/06/2019 1413   PROTEINUR NEGATIVE 08/06/2019 1413   UROBILINOGEN 0.2 07/13/2018 1651   UROBILINOGEN 1.0 07/09/2017 1324   NITRITE NEGATIVE 08/06/2019 1413   LEUKOCYTESUR NEGATIVE 08/06/2019 1413   Sepsis Labs Invalid input(s): PROCALCITONIN,  WBC,  LACTICIDVEN Microbiology Recent Results (from the past 240 hour(s))   SARS Coronavirus 2 by RT PCR (hospital order, performed in [facility] hospital lab) Nasopharyngeal Nasopharyngeal Swab     Status: None   Collection Time: 08/06/19  2:52 PM   Specimen: Nasopharyngeal Swab  Result Value Ref Range Status   SARS Coronavirus 2 NEGATIVE NEGATIVE Final    Comment: (NOTE) SARS-CoV-2 target nucleic acids are NOT DETECTED.  The SARS-CoV-2 RNA is generally detectable in upper and lower respiratory specimens during the acute phase of infection. The lowest concentration of SARS-CoV-2 viral copies this assay can detect is 250 copies / mL. A negative result does not preclude SARS-CoV-2 infection and should not be used as the sole basis for treatment or other patient management decisions.  A negative result may occur with improper specimen collection / handling, submission of specimen other than nasopharyngeal swab, presence of viral mutation(s) within the areas targeted by this assay, and inadequate number of viral copies (<250 copies / mL). A negative result must be combined with clinical observations, patient history, and epidemiological information.  Fact Sheet for Patients:   BoilerBrush.com.cy  Fact Sheet for Healthcare Providers: https://pope.com/  This test is not yet approved or  cleared by the [place] FDA and has been authorized for detection and/or diagnosis of SARS-CoV-2 by FDA under an Emergency Use Authorization (EUA).  This EUA will remain in effect (meaning this test can be used) for the duration of the COVID-19 declaration under Section 564(b)(1) of the Act, 21 U.S.C. section 360bbb-3(b)(1), unless the authorization is terminated or revoked sooner.  Performed at [facility], [address] [place], [place] [address]     Procedures/Studies: EEG  Result Date: 08/08/2019 Charlsie Quest, MD     08/08/2019  4:35 PM Patient Name: Nancy [name]  MRN: [id number] Epilepsy  Attending: Charlsie Quest Referring Physician/Provider: Dr. Lacretia Nicks Date: 08/08/2019 Duration: 24.36 mins Patient history: 19 year old female with history of seizures/nonepileptic events.  EEG to assess for seizures. Level of alertness: Awake, asleep AEDs during EEG study: Lamotrigine, topiramate Technical aspects: This EEG study was done with scalp electrodes positioned according to the 10-20 International system of electrode placement. Electrical activity was acquired at a sampling rate of 500Hz  and reviewed with a high frequency filter of 70Hz  and a low frequency filter of 1Hz . EEG data were recorded continuously and digitally stored. Description: The posterior dominant rhythm consists of 9 Hz activity of moderate voltage (25-35 uV) seen predominantly in posterior head regions, symmetric and reactive to eye opening and eye closing. Sleep was characterized by vertex waves, sleep spindles (12 to 14 Hz), maximal frontocentral region. Hyperventilation and photic stimulation were not performed.   IMPRESSION: This study is within normal limits. No seizures or epileptiform discharges were seen throughout the recording.     Time coordinating discharge: Over 30 minutes    , MD  [facility] 08/10/2019, 5:47 PM Pager: Secure chat  If 7PM-7AM, please contact night-coverage [email or web address] Password [email or web address]

## 2019-08-15 LAB — GLUCOSE, CAPILLARY
Glucose-Capillary: 149 mg/dL — ABNORMAL HIGH (ref 70–99)
Glucose-Capillary: 105 mg/dL — ABNORMAL HIGH (ref 70–99)
Glucose-Capillary: 171 mg/dL — ABNORMAL HIGH (ref 70–99)
Glucose-Capillary: 166 mg/dL — ABNORMAL HIGH (ref 70–99)
Glucose-Capillary: 181 mg/dL — ABNORMAL HIGH (ref 70–99)

## 2019-08-23 ENCOUNTER — Other Ambulatory Visit: Payer: Self-pay

## 2019-08-23 ENCOUNTER — Encounter (HOSPITAL_COMMUNITY): Payer: Self-pay | Admitting: Emergency Medicine

## 2019-08-23 ENCOUNTER — Inpatient Hospital Stay (HOSPITAL_COMMUNITY): Payer: Medicaid Other

## 2019-08-23 ENCOUNTER — Emergency Department (HOSPITAL_COMMUNITY): Payer: Medicaid Other

## 2019-08-23 ENCOUNTER — Inpatient Hospital Stay (HOSPITAL_COMMUNITY)
Admission: EM | Admit: 2019-08-23 | Discharge: 2019-09-04 | DRG: 639 | Disposition: A | Discharge status: Home / Self Care | Payer: Medicaid Other | Attending: Internal Medicine | Admitting: Internal Medicine

## 2019-08-23 DIAGNOSIS — F4322 Adjustment disorder with anxiety: Secondary | ICD-10-CM | POA: Diagnosis present

## 2019-08-23 DIAGNOSIS — Z794 Long term (current) use of insulin: Secondary | ICD-10-CM | POA: Diagnosis not present

## 2019-08-23 DIAGNOSIS — K5909 Other constipation: Secondary | ICD-10-CM | POA: Diagnosis present

## 2019-08-23 DIAGNOSIS — Z6838 Body mass index (BMI) 38.0-38.9, adult: Secondary | ICD-10-CM

## 2019-08-23 DIAGNOSIS — F329 Major depressive disorder, single episode, unspecified: Secondary | ICD-10-CM | POA: Diagnosis present

## 2019-08-23 DIAGNOSIS — E1110 Type 2 diabetes mellitus with ketoacidosis without coma: Secondary | ICD-10-CM | POA: Diagnosis present

## 2019-08-23 DIAGNOSIS — E1165 Type 2 diabetes mellitus with hyperglycemia: Secondary | ICD-10-CM | POA: Diagnosis not present

## 2019-08-23 DIAGNOSIS — Z886 Allergy status to analgesic agent status: Secondary | ICD-10-CM

## 2019-08-23 DIAGNOSIS — K5900 Constipation, unspecified: Secondary | ICD-10-CM

## 2019-08-23 DIAGNOSIS — Z83438 Family history of other disorder of lipoprotein metabolism and other lipidemia: Secondary | ICD-10-CM

## 2019-08-23 DIAGNOSIS — Z800 Family history of malignant neoplasm of digestive organs: Secondary | ICD-10-CM | POA: Diagnosis not present

## 2019-08-23 DIAGNOSIS — E1010 Type 1 diabetes mellitus with ketoacidosis without coma: Principal | ICD-10-CM | POA: Diagnosis present

## 2019-08-23 DIAGNOSIS — Z4659 Encounter for fitting and adjustment of other gastrointestinal appliance and device: Secondary | ICD-10-CM

## 2019-08-23 DIAGNOSIS — R569 Unspecified convulsions: Secondary | ICD-10-CM | POA: Diagnosis not present

## 2019-08-23 DIAGNOSIS — R112 Nausea with vomiting, unspecified: Secondary | ICD-10-CM

## 2019-08-23 DIAGNOSIS — Z833 Family history of diabetes mellitus: Secondary | ICD-10-CM

## 2019-08-23 DIAGNOSIS — G40909 Epilepsy, unspecified, not intractable, without status epilepticus: Secondary | ICD-10-CM | POA: Diagnosis present

## 2019-08-23 DIAGNOSIS — R109 Unspecified abdominal pain: Secondary | ICD-10-CM | POA: Diagnosis not present

## 2019-08-23 DIAGNOSIS — Z825 Family history of asthma and other chronic lower respiratory diseases: Secondary | ICD-10-CM | POA: Diagnosis not present

## 2019-08-23 DIAGNOSIS — Z91018 Allergy to other foods: Secondary | ICD-10-CM | POA: Diagnosis not present

## 2019-08-23 DIAGNOSIS — Z8049 Family history of malignant neoplasm of other genital organs: Secondary | ICD-10-CM | POA: Diagnosis not present

## 2019-08-23 DIAGNOSIS — I509 Heart failure, unspecified: Secondary | ICD-10-CM | POA: Diagnosis present

## 2019-08-23 DIAGNOSIS — Z79899 Other long term (current) drug therapy: Secondary | ICD-10-CM

## 2019-08-23 DIAGNOSIS — I110 Hypertensive heart disease with heart failure: Secondary | ICD-10-CM | POA: Diagnosis present

## 2019-08-23 DIAGNOSIS — R1084 Generalized abdominal pain: Secondary | ICD-10-CM | POA: Diagnosis present

## 2019-08-23 DIAGNOSIS — Z20822 Contact with and (suspected) exposure to covid-19: Secondary | ICD-10-CM | POA: Diagnosis present

## 2019-08-23 DIAGNOSIS — G8929 Other chronic pain: Secondary | ICD-10-CM | POA: Diagnosis present

## 2019-08-23 DIAGNOSIS — Z8249 Family history of ischemic heart disease and other diseases of the circulatory system: Secondary | ICD-10-CM

## 2019-08-23 DIAGNOSIS — R739 Hyperglycemia, unspecified: Secondary | ICD-10-CM | POA: Diagnosis present

## 2019-08-23 DIAGNOSIS — Z9119 Patient's noncompliance with other medical treatment and regimen: Secondary | ICD-10-CM

## 2019-08-23 DIAGNOSIS — R55 Syncope and collapse: Secondary | ICD-10-CM

## 2019-08-23 LAB — BASIC METABOLIC PANEL
Anion gap: 17 — ABNORMAL HIGH (ref 5–15)
Anion gap: 9 (ref 5–15)
BUN: 14 mg/dL (ref 6–20)
BUN: 11 mg/dL (ref 6–20)
CO2: 23 mmol/L (ref 22–32)
CO2: 24 mmol/L (ref 22–32)
Calcium: 8.9 mg/dL (ref 8.9–10.3)
Calcium: 8.3 mg/dL — ABNORMAL LOW (ref 8.9–10.3)
Chloride: 94 mmol/L — ABNORMAL LOW (ref 98–111)
Chloride: 102 mmol/L (ref 98–111)
Creatinine, Ser: 0.89 mg/dL (ref 0.44–1.00)
Creatinine, Ser: 0.57 mg/dL (ref 0.44–1.00)
GFR calc Af Amer: >60 mL/min (ref >60)
GFR calc Af Amer: >60 mL/min (ref >60)
GFR calc non Af Amer: >60 mL/min (ref >60)
GFR calc non Af Amer: >60 mL/min (ref >60)
Glucose, Bld: 384 mg/dL — ABNORMAL HIGH (ref 70–99)
Glucose, Bld: 185 mg/dL — ABNORMAL HIGH (ref 70–99)
Potassium: 3.9 mmol/L (ref 3.5–5.1)
Potassium: 3.3 mmol/L — ABNORMAL LOW (ref 3.5–5.1)
Sodium: 134 mmol/L — ABNORMAL LOW (ref 135–145)
Sodium: 135 mmol/L (ref 135–145)

## 2019-08-23 LAB — COMPREHENSIVE METABOLIC PANEL
ALT: 20 U/L (ref 0–44)
AST: 12 U/L — ABNORMAL LOW (ref 15–41)
Albumin: 4.4 g/dL (ref 3.5–5.0)
Alkaline Phosphatase: 102 U/L (ref 38–126)
Anion gap: 17 — ABNORMAL HIGH (ref 5–15)
BUN: 13 mg/dL (ref 6–20)
CO2: 24 mmol/L (ref 22–32)
Calcium: 9.8 mg/dL (ref 8.9–10.3)
Chloride: 92 mmol/L — ABNORMAL LOW (ref 98–111)
Creatinine, Ser: 0.76 mg/dL (ref 0.44–1.00)
GFR calc Af Amer: >60 mL/min (ref >60)
GFR calc non Af Amer: >60 mL/min (ref >60)
Glucose, Bld: 428 mg/dL — ABNORMAL HIGH (ref 70–99)
Potassium: 4 mmol/L (ref 3.5–5.1)
Sodium: 133 mmol/L — ABNORMAL LOW (ref 135–145)
Total Bilirubin: 1.2 mg/dL (ref 0.3–1.2)
Total Protein: 8.1 g/dL (ref 6.5–8.1)

## 2019-08-23 LAB — URINALYSIS, ROUTINE W REFLEX MICROSCOPIC
Bilirubin Urine: NEGATIVE
Glucose, UA: >=500 mg/dL — AB
Ketones, ur: 80 mg/dL — AB
Leukocytes,Ua: NEGATIVE
Nitrite: NEGATIVE
Protein, ur: NEGATIVE mg/dL
Specific Gravity, Urine: 1.038 — ABNORMAL HIGH (ref 1.005–1.030)
pH: 6 (ref 5.0–8.0)

## 2019-08-23 LAB — LIPASE, BLOOD: Lipase: 19 U/L (ref 11–51)

## 2019-08-23 LAB — CBC
HCT: 45.3 % (ref 36.0–46.0)
Hemoglobin: 14.2 g/dL (ref 12.0–15.0)
MCH: 25.5 pg — ABNORMAL LOW (ref 26.0–34.0)
MCHC: 31.3 g/dL (ref 30.0–36.0)
MCV: 81.3 fL (ref 80.0–100.0)
Platelets: 391 10*3/uL (ref 150–400)
RBC: 5.57 MIL/uL — ABNORMAL HIGH (ref 3.87–5.11)
RDW: 15.3 % (ref 11.5–15.5)
WBC: 18.4 10*3/uL — ABNORMAL HIGH (ref 4.0–10.5)
nRBC: 0 % (ref 0.0–0.2)

## 2019-08-23 LAB — TYPE AND SCREEN
ABO/RH(D): A POS
Antibody Screen: NEGATIVE

## 2019-08-23 LAB — BETA-HYDROXYBUTYRIC ACID
Beta-Hydroxybutyric Acid: 2.2 mmol/L — ABNORMAL HIGH (ref 0.05–0.27)
Beta-Hydroxybutyric Acid: 0.19 mmol/L (ref 0.05–0.27)

## 2019-08-23 LAB — I-STAT VENOUS BLOOD GAS, ED
Acid-Base Excess: 4 mmol/L — ABNORMAL HIGH (ref 0.0–2.0)
Bicarbonate: 28.7 mmol/L — ABNORMAL HIGH (ref 20.0–28.0)
Calcium, Ion: 1.04 mmol/L — ABNORMAL LOW (ref 1.15–1.40)
HCT: 47 % — ABNORMAL HIGH (ref 36.0–46.0)
Hemoglobin: 16 g/dL — ABNORMAL HIGH (ref 12.0–15.0)
O2 Saturation: 62 %
Potassium: 3.9 mmol/L (ref 3.5–5.1)
Sodium: 133 mmol/L — ABNORMAL LOW (ref 135–145)
TCO2: 30 mmol/L (ref 22–32)
pCO2, Ven: 41.7 mmHg — ABNORMAL LOW (ref 44.0–60.0)
pH, Ven: 7.446 — ABNORMAL HIGH (ref 7.250–7.430)
pO2, Ven: 31 mmHg — CL (ref 32.0–45.0)

## 2019-08-23 LAB — SARS CORONAVIRUS 2 BY RT PCR (HOSPITAL ORDER, PERFORMED IN ~~LOC~~ HOSPITAL LAB): SARS Coronavirus 2: NEGATIVE

## 2019-08-23 LAB — CBG MONITORING, ED
Glucose-Capillary: 455 mg/dL — ABNORMAL HIGH (ref 70–99)
Glucose-Capillary: 370 mg/dL — ABNORMAL HIGH (ref 70–99)
Glucose-Capillary: 237 mg/dL — ABNORMAL HIGH (ref 70–99)
Glucose-Capillary: 215 mg/dL — ABNORMAL HIGH (ref 70–99)
Glucose-Capillary: 176 mg/dL — ABNORMAL HIGH (ref 70–99)
Glucose-Capillary: 187 mg/dL — ABNORMAL HIGH (ref 70–99)
Glucose-Capillary: 167 mg/dL — ABNORMAL HIGH (ref 70–99)
Glucose-Capillary: 167 mg/dL — ABNORMAL HIGH (ref 70–99)

## 2019-08-23 LAB — PROTIME-INR
INR: 1.1 (ref 0.8–1.2)
Prothrombin Time: 13.3 seconds (ref 11.4–15.2)

## 2019-08-23 LAB — TROPONIN I (HIGH SENSITIVITY): Troponin I (High Sensitivity): 5 ng/L (ref <18)

## 2019-08-23 LAB — I-STAT BETA HCG BLOOD, ED (MC, WL, AP ONLY): I-stat hCG, quantitative: <5 m[IU]/mL

## 2019-08-23 LAB — APTT: aPTT: 26 s (ref 24–36)

## 2019-08-23 LAB — LACTIC ACID, PLASMA: Lactic Acid, Venous: 2.4 mmol/L (ref 0.5–1.9)

## 2019-08-23 MED ORDER — ATORVASTATIN CALCIUM 10 MG PO TABS
20.0000 mg | ORAL_TABLET | Freq: Every day | ORAL | Status: DC
  Administered 2019-08-23 – 2019-09-03 (×12): 20 mg via ORAL
  Filled 2019-08-23 (×12): qty 2

## 2019-08-23 MED ORDER — LUBIPROSTONE 24 MCG PO CAPS
24.0000 ug | ORAL_CAPSULE | Freq: Two times a day (BID) | ORAL | Status: DC
  Administered 2019-08-23 – 2019-08-28 (×9): 24 ug via ORAL
  Filled 2019-08-23 (×11): qty 1

## 2019-08-23 MED ORDER — ENOXAPARIN SODIUM 40 MG/0.4ML ~~LOC~~ SOLN
40.0000 mg | SUBCUTANEOUS | Status: DC
  Administered 2019-08-23 – 2019-09-03 (×12): 40 mg via SUBCUTANEOUS
  Filled 2019-08-23 (×12): qty 0.4

## 2019-08-23 MED ORDER — HYDROXYZINE HCL 25 MG PO TABS
50.0000 mg | ORAL_TABLET | Freq: Three times a day (TID) | ORAL | Status: DC | PRN
  Administered 2019-08-24 – 2019-08-26 (×3): 50 mg via ORAL
  Filled 2019-08-23 (×3): qty 2

## 2019-08-23 MED ORDER — PROPRANOLOL HCL 20 MG PO TABS
20.0000 mg | ORAL_TABLET | Freq: Two times a day (BID) | ORAL | Status: DC
  Administered 2019-08-23 – 2019-09-03 (×21): 20 mg via ORAL
  Filled 2019-08-23 (×24): qty 1

## 2019-08-23 MED ORDER — SODIUM CHLORIDE 0.9 % IV BOLUS
1000.0000 mL | Freq: Once | INTRAVENOUS | Status: AC
  Administered 2019-08-23: 1000 mL via INTRAVENOUS

## 2019-08-23 MED ORDER — INSULIN ASPART 100 UNIT/ML ~~LOC~~ SOLN
0.0000 [IU] | Freq: Three times a day (TID) | SUBCUTANEOUS | Status: DC
  Administered 2019-08-24: 3 [IU] via SUBCUTANEOUS
  Administered 2019-08-24: 8 [IU] via SUBCUTANEOUS
  Administered 2019-08-24: 4 [IU] via SUBCUTANEOUS
  Administered 2019-08-24 (×2): 3 [IU] via SUBCUTANEOUS
  Administered 2019-08-25: 5 [IU] via SUBCUTANEOUS
  Administered 2019-08-25: 11 [IU] via SUBCUTANEOUS
  Administered 2019-08-25 – 2019-08-26 (×3): 5 [IU] via SUBCUTANEOUS
  Administered 2019-08-26: 3 [IU] via SUBCUTANEOUS
  Administered 2019-08-27: 2 [IU] via SUBCUTANEOUS
  Administered 2019-08-27: 5 [IU] via SUBCUTANEOUS
  Administered 2019-08-28 (×2): 2 [IU] via SUBCUTANEOUS
  Administered 2019-08-28: 8 [IU] via SUBCUTANEOUS
  Administered 2019-08-28 (×2): 3 [IU] via SUBCUTANEOUS
  Administered 2019-08-29: 5 [IU] via SUBCUTANEOUS
  Administered 2019-08-29: 8 [IU] via SUBCUTANEOUS

## 2019-08-23 MED ORDER — LACTATED RINGERS IV SOLN: INTRAVENOUS | Status: DC

## 2019-08-23 MED ORDER — INSULIN REGULAR(HUMAN) IN NACL 100-0.9 UT/100ML-% IV SOLN
INTRAVENOUS | Status: DC
  Administered 2019-08-23: 13 [IU]/h via INTRAVENOUS
  Filled 2019-08-23: qty 100

## 2019-08-23 MED ORDER — SUCRALFATE 1 GM/10ML PO SUSP
1.0000 g | Freq: Three times a day (TID) | ORAL | Status: DC
  Administered 2019-08-23 – 2019-08-28 (×13): 1 g via ORAL
  Filled 2019-08-23 (×15): qty 10

## 2019-08-23 MED ORDER — DEXTROSE 50 % IV SOLN: 0.0000 mL | INTRAVENOUS | Status: DC | PRN

## 2019-08-23 MED ORDER — TOPIRAMATE 25 MG PO TABS
25.0000 mg | ORAL_TABLET | Freq: Two times a day (BID) | ORAL | Status: DC
  Administered 2019-08-23 – 2019-09-03 (×22): 25 mg via ORAL
  Filled 2019-08-23 (×24): qty 1

## 2019-08-23 MED ORDER — OLOPATADINE HCL 0.7 % OP SOLN: 2.0000 [drp] | Freq: Two times a day (BID) | OPHTHALMIC | Status: DC

## 2019-08-23 MED ORDER — POTASSIUM CHLORIDE 10 MEQ/100ML IV SOLN
10.0000 meq | INTRAVENOUS | Status: AC
  Administered 2019-08-23 (×2): 10 meq via INTRAVENOUS
  Filled 2019-08-23 (×2): qty 100

## 2019-08-23 MED ORDER — DEXTROSE IN LACTATED RINGERS 5 % IV SOLN: INTRAVENOUS | Status: DC

## 2019-08-23 MED ORDER — INSULIN GLARGINE 100 UNIT/ML ~~LOC~~ SOLN: 35.0000 [IU] | SUBCUTANEOUS | Status: DC

## 2019-08-23 MED ORDER — LAMOTRIGINE 25 MG PO TABS
50.0000 mg | ORAL_TABLET | Freq: Two times a day (BID) | ORAL | Status: DC
  Filled 2019-08-23: qty 2

## 2019-08-23 MED ORDER — MIRTAZAPINE 15 MG PO TABS
7.5000 mg | ORAL_TABLET | Freq: Every day | ORAL | Status: DC
  Administered 2019-08-23 – 2019-09-03 (×12): 7.5 mg via ORAL
  Filled 2019-08-23 (×14): qty 1

## 2019-08-23 MED ORDER — ONDANSETRON HCL 4 MG/2ML IJ SOLN
4.0000 mg | Freq: Once | INTRAMUSCULAR | Status: AC
  Administered 2019-08-23: 4 mg via INTRAVENOUS
  Filled 2019-08-23: qty 2

## 2019-08-23 MED ORDER — HYDROMORPHONE HCL 1 MG/ML IJ SOLN
0.5000 mg | Freq: Once | INTRAMUSCULAR | Status: AC
  Administered 2019-08-23: 0.5 mg via INTRAVENOUS
  Filled 2019-08-23: qty 1

## 2019-08-23 MED ORDER — PANTOPRAZOLE SODIUM 40 MG PO TBEC
40.0000 mg | DELAYED_RELEASE_TABLET | Freq: Two times a day (BID) | ORAL | Status: DC
  Administered 2019-08-23 – 2019-09-03 (×23): 40 mg via ORAL
  Filled 2019-08-23 (×24): qty 1

## 2019-08-23 MED ORDER — BUSPIRONE HCL 10 MG PO TABS
10.0000 mg | ORAL_TABLET | Freq: Three times a day (TID) | ORAL | Status: DC
  Administered 2019-08-23 – 2019-09-03 (×33): 10 mg via ORAL
  Filled 2019-08-23 (×34): qty 1

## 2019-08-23 MED ORDER — QUETIAPINE FUMARATE 100 MG PO TABS
300.0000 mg | ORAL_TABLET | Freq: Every day | ORAL | Status: DC
  Administered 2019-08-23 – 2019-09-03 (×12): 300 mg via ORAL
  Filled 2019-08-23 (×4): qty 3
  Filled 2019-08-23: qty 1
  Filled 2019-08-23 (×7): qty 3
  Filled 2019-08-23: qty 1

## 2019-08-23 MED ORDER — DICYCLOMINE HCL 10 MG PO CAPS
10.0000 mg | ORAL_CAPSULE | Freq: Three times a day (TID) | ORAL | Status: DC | PRN
  Administered 2019-08-24 – 2019-08-29 (×5): 10 mg via ORAL
  Filled 2019-08-23 (×6): qty 1

## 2019-08-23 MED ORDER — LAMOTRIGINE 100 MG PO TABS
50.0000 mg | ORAL_TABLET | Freq: Three times a day (TID) | ORAL | Status: DC
  Administered 2019-08-23 – 2019-09-03 (×34): 50 mg via ORAL
  Filled 2019-08-23 (×2): qty 2
  Filled 2019-08-23 (×3): qty 1
  Filled 2019-08-23: qty 2
  Filled 2019-08-23 (×10): qty 1
  Filled 2019-08-23: qty 2
  Filled 2019-08-23 (×5): qty 1
  Filled 2019-08-23: qty 2
  Filled 2019-08-23 (×13): qty 1

## 2019-08-23 MED ORDER — LACTATED RINGERS IV BOLUS: 20.0000 mL/kg | Freq: Once | INTRAVENOUS | Status: DC

## 2019-08-23 MED ORDER — PROCHLORPERAZINE 25 MG RE SUPP
25.0000 mg | Freq: Two times a day (BID) | RECTAL | Status: DC | PRN
  Filled 2019-08-23: qty 1

## 2019-08-23 MED ORDER — ONDANSETRON HCL 4 MG/2ML IJ SOLN
4.0000 mg | Freq: Four times a day (QID) | INTRAMUSCULAR | Status: DC | PRN
  Administered 2019-08-24 (×2): 4 mg via INTRAVENOUS
  Filled 2019-08-23 (×2): qty 2

## 2019-08-23 MED ORDER — INSULIN GLARGINE 100 UNIT/ML ~~LOC~~ SOLN
50.0000 [IU] | SUBCUTANEOUS | Status: DC
  Administered 2019-08-23 – 2019-08-30 (×8): 50 [IU] via SUBCUTANEOUS
  Filled 2019-08-23 (×11): qty 0.5

## 2019-08-23 MED ORDER — FAMOTIDINE IN NACL 20-0.9 MG/50ML-% IV SOLN
20.0000 mg | Freq: Once | INTRAVENOUS | Status: AC
  Administered 2019-08-23: 20 mg via INTRAVENOUS
  Filled 2019-08-23: qty 50

## 2019-08-23 MED ORDER — MORPHINE SULFATE (PF) 4 MG/ML IV SOLN
4.0000 mg | INTRAVENOUS | Status: AC | PRN
  Administered 2019-08-23 – 2019-08-24 (×4): 4 mg via INTRAVENOUS
  Filled 2019-08-23 (×4): qty 1

## 2019-08-23 MED ORDER — CYCLOSPORINE 0.05 % OP EMUL: 1.0000 [drp] | Freq: Two times a day (BID) | OPHTHALMIC | Status: DC

## 2019-08-23 NOTE — H&P (Signed)
History and Physical    Nancy [name] PXT:[id number] DOB: 31-Jul-2000 DOA: 08/23/2019  PCP: Fleet Contras, MD (Confirm with patient/family/NH records and if not entered, this has to be entered at [facility] point of entry) Patient coming from: Home  I have personally briefly reviewed patient's old medical records in [facility]  Chief Complaint: Feel sick  HPI: Nancy [name] is a 19 y.o. 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    Patient was admitted in the hospital 3 weeks ago for similar presentations.  This time patient started to have diffuse abdominal pain cramping like 5-6 over 10 at the beginning, since 6 days ago.  She took Bentyl with partial relief.   Then 2 days later she developed watery diarrhea about 10 bouts a day watery with brown and greenish stuff no blood no mucus, no tenesmus.  Severe feeling of nauseous and multiple episodes of vomiting and unable to eat or drink.  2 days ago diarrhea resolved.  However the abdominal pain persisted.  Vomiting became dry hives since yesterday and she vomited 2 times of streak of bright blood, but resolved. Her mom also noticed seizure-like activity with shaking and tremors yesterday, with urinary incontinence.  Patient however does not recall any of those episodes.  She reported that she was unable to take long-acting insulin since yesterday but not the short acting because she basically not able to resume eating.patient denied sick contacts or recent traveling, and none of the family member has similar diarrhea during this time. ED Course: Elevation of glucose more than 450, anion gap 17, tachycardia.  Review of Systems: As per HPI otherwise 14 point review  of systems negative.    Past Medical History:  Diagnosis Date  . Acanthosis nigricans   . Anxiety   . CHF (congestive heart failure) (HCC)   . Chronic lower back pain   . Depression   . DKA, type 1 (HCC) 09/13/2018  . Dyspepsia   . Obesity   . Ovarian cyst    pt is not aware of this hx (11/24/2017)  . 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 Premature baby   . Seizures (HCC)   . Type II diabetes mellitus (HCC)    insulin dependant    Past Surgical History:  Procedure Laterality Date  . ABDOMINAL HERNIA REPAIR     "I was a baby"  . BIOPSY  10/12/2018   Procedure: BIOPSY;  Surgeon: Lynann Bologna, MD;  Location: [facility] ENDOSCOPY;  Service: Endoscopy;;  . ESOPHAGOGASTRODUODENOSCOPY (EGD) WITH PROPOFOL N/A 10/12/2018   Procedure: ESOPHAGOGASTRODUODENOSCOPY (EGD) WITH PROPOFOL;  Surgeon: Lynann Bologna, MD;  Location: [facility] ENDOSCOPY;  Service: Endoscopy;  Laterality: N/A;  . HERNIA REPAIR    . LEFT HEART CATH AND CORONARY ANGIOGRAPHY N/A 10/13/2018   Procedure: LEFT HEART CATH AND CORONARY ANGIOGRAPHY;  Surgeon: Kathleene Hazel, MD;  Location: [facility] INVASIVE CV LAB;  Service: Cardiovascular;  Laterality: N/A;  . TONSILLECTOMY AND ADENOIDECTOMY    . WISDOM TOOTH EXTRACTION  2017     reports that she has never smoked. She has never used smokeless tobacco. She reports that she does not drink alcohol and does not use drugs.  Allergies  Allergen Reactions  . Ibuprofen Other (See Comments)    GI MD said to not  take this anymore  . Oatmeal Rash    Family History  Problem Relation Age of Onset  . Diabetes Mother   . Hypertension Mother   . Obesity Mother   . Asthma Mother   . Allergic rhinitis Mother   . Eczema Mother   . Cervical cancer Mother   . Diabetes Father   . Hypertension Father   . Obesity Father   . Hyperlipidemia Father   . Hypertension Paternal Aunt   . Hypertension Maternal Grandfather   . Colon cancer Maternal Grandfather   . Diabetes Paternal Grandmother   . Obesity  Paternal Grandmother   . Diabetes Paternal Grandfather   . Obesity Paternal Grandfather   . Angioedema Neg Hx   . Immunodeficiency Neg Hx   . Urticaria Neg Hx   . Stomach cancer Neg Hx   . Esophageal cancer Neg Hx      Prior to Admission medications   Medication Sig Start Date End Date Taking? Authorizing Provider  atorvastatin (LIPITOR) 20 MG tablet Take 1 tablet (20 mg total) by mouth daily at 6 PM. 10/05/18   Storm Frisk, MD  bisacodyl (DULCOLAX) 10 MG suppository Place 1 suppository (10 mg total) rectally daily. Patient taking differently: Place 10 mg rectally daily as needed for mild constipation.  12/24/18   Swayze, Ava, DO  busPIRone (BUSPAR) 10 MG tablet Take 10 mg by mouth 3 (three) times daily.  11/19/18   [provider]  [place]way Oil-Levomenthol (FDGARD) 25-20.75 MG CAPS Take 2 capsules by mouth as needed. Take as directed as needed Patient not taking: Reported on 08/05/2019 03/21/19   Benancio Deeds, MD  cycloSPORINE (RESTASIS) 0.05 % ophthalmic emulsion Place 1 drop into both eyes 2 (two) times daily.    [provider]  dicyclomine (BENTYL) 10 MG capsule Take 10 mg by mouth 3 (three) times daily as needed for spasms.  07/05/19   [provider]  hydrOXYzine (ATARAX/VISTARIL) 50 MG tablet Take 1 tablet (50 mg total) by mouth 3 (three) times daily as needed for anxiety. Patient taking differently: Take 50 mg by mouth in the morning and at bedtime.  11/11/18   Armandina Stammer I, NP  insulin aspart (NOVOLOG FLEXPEN) 100 UNIT/ML FlexPen Inject 15 Units into the skin 3 (three) times daily with meals.    [provider]  lamoTRIgine (LAMICTAL) 25 MG tablet Take 2 tablets (50 mg total) by mouth 2 (two) times daily. For mood stabilization 11/11/18   Nwoko, Nicole Kindred I, NP  LANTUS SOLOSTAR 100 UNIT/ML Solostar Pen Inject 70 Units into the skin at bedtime.  07/05/19   [provider]  lubiprostone (AMITIZA) 24 MCG capsule Take 1 capsule (24 mcg  total) by mouth 2 (two) times daily with a meal. 10/27/18   Esterwood, Amy S, PA-C  mirtazapine (REMERON) 7.5 MG tablet Take 1 tablet (7.5 mg total) by mouth at bedtime. For depression/sleep 11/11/18   Armandina Stammer I, NP  Multiple Vitamin (MULTIVITAMIN WITH MINERALS) TABS tablet Take 1 tablet by mouth daily. 09/18/18   Clapacs, Jackquline Denmark, MD  Olopatadine HCl (PAZEO) 0.7 % SOLN Place 2 drops into both eyes 2 (two) times daily.     [provider]  pantoprazole (PROTONIX) 40 MG tablet Take 1 tablet (40 mg total) by mouth 2 (two) times daily. For acid reflux 01/29/19   Jae Dire, MD  polyethylene glycol (MIRALAX / GLYCOLAX) 17 g packet Take 17 g by mouth daily. Patient taking differently: Take 17 g  by mouth daily as needed for moderate constipation.  12/24/18   Swayze, Ava, DO  prochlorperazine (COMPAZINE) 25 MG suppository Place 1 suppository (25 mg total) rectally every 12 (twelve) hours as needed for nausea or vomiting. 12/05/18   Melene Plan, DO  promethazine (PHENERGAN) 25 MG suppository Place 25 mg rectally every 8 (eight) hours as needed for nausea or vomiting.    [provider]  propranolol (INDERAL) 20 MG tablet Take 1 tablet (20 mg total) by mouth 2 (two) times daily. For anxiety/HTN 12/23/18   Swayze, Ava, DO  Prucalopride Succinate (MOTEGRITY) 2 MG TABS Take 1 tablet (2 mg total) by mouth daily. 03/21/19   Armbruster, Willaim Rayas, MD  QUEtiapine (SEROQUEL) 100 MG tablet Take 300 mg by mouth at bedtime. 07/05/19   [provider]  sucralfate (CARAFATE) 1 GM/10ML suspension Take 1 g by mouth 3 (three) times daily. 07/05/19   [provider]  topiramate (TOPAMAX) 25 MG tablet Take 1 tablet (25 mg total) by mouth 2 (two) times daily. For seizure activities 11/11/18   Armandina Stammer I, NP    Physical Exam: Vitals:   08/23/19 0934 08/23/19 1000 08/23/19 1110 08/23/19 1200  BP: 129/78 110/75 115/79 (!) 111/94  Pulse: (!) 119 (!) 122 (!) 122 (!) 117  Resp: 16 16 20  (!)  24  Temp: 99.1 F (37.3 C)     TempSrc: Oral     SpO2: 100% 100% 98% 100%    Constitutional: NAD, calm, comfortable Vitals:   08/23/19 0934 08/23/19 1000 08/23/19 1110 08/23/19 1200  BP: 129/78 110/75 115/79 (!) 111/94  Pulse: (!) 119 (!) 122 (!) 122 (!) 117  Resp: 16 16 20  (!) 24  Temp: 99.1 F (37.3 C)     TempSrc: Oral     SpO2: 100% 100% 98% 100%   Eyes: PERRL, lids and conjunctivae normal ENMT: Mucous membranes are dry. Posterior pharynx clear of any exudate or lesions.Normal dentition.  Neck: normal, supple, no masses, no thyromegaly Respiratory: clear to auscultation bilaterally, no wheezing, no crackles. Normal respiratory effort. No accessory muscle use.  Cardiovascular: Regular rate and rhythm, no murmurs / rubs / gallops. No extremity edema. 2+ pedal pulses. No carotid bruits.  Abdomen: mild tenderness on deep palpation on all quadrants without rebound or guarding, no masses palpated. No hepatosplenomegaly. Bowel sounds positive.  Musculoskeletal: no clubbing / cyanosis. No joint deformity upper and lower extremities. Good ROM, no contractures. Normal muscle tone.  Skin: no rashes, lesions, ulcers. No induration Neurologic: CN 2-12 grossly intact. Sensation intact, DTR normal. Strength 5/5 in all 4.  Psychiatric: Normal judgment and insight. Alert and oriented x 3. Normal mood.     Labs on Admission: I have personally reviewed following labs and imaging studies  CBC: Recent Labs  Lab 08/23/19 1020 08/23/19 1027  WBC 18.4*  --   HGB 14.2 16.0*  HCT 45.3 47.0*  MCV 81.3  --   PLT 391  --    Basic Metabolic Panel: Recent Labs  Lab 08/23/19 1020 08/23/19 1027  NA 133* 133*  K 4.0 3.9  CL 92*  --   CO2 24  --   GLUCOSE 428*  --   BUN 13  --   CREATININE 0.76  --   CALCIUM 9.8  --    GFR: CrCl cannot be calculated (Unknown ideal weight.). Liver Function Tests: Recent Labs  Lab 08/23/19 1020  AST 12*  ALT 20  ALKPHOS 102  BILITOT 1.2  PROT 8.1  ALBUMIN 4.4   Recent Labs  Lab 08/23/19 1020  LIPASE 19   No results for input(s): AMMONIA in the last 168 hours. Coagulation Profile: Recent Labs  Lab 08/23/19 1020  INR 1.1   Cardiac Enzymes: No results for input(s): CKTOTAL, CKMB, CKMBINDEX, TROPONINI in the last 168 hours. BNP (last 3 results) No results for input(s): PROBNP in the last 8760 hours. HbA1C: No results for input(s): HGBA1C in the last 72 hours. CBG: Recent Labs  Lab 08/23/19 0936  GLUCAP 455*   Lipid Profile: No results for input(s): CHOL, HDL, LDLCALC, TRIG, CHOLHDL, LDLDIRECT in the last 72 hours. Thyroid Function Tests: No results for input(s): TSH, T4TOTAL, FREET4, T3FREE, THYROIDAB in the last 72 hours. Anemia Panel: No results for input(s): VITAMINB12, FOLATE, FERRITIN, TIBC, IRON, RETICCTPCT in the last 72 hours. Urine analysis:    Component Value Date/Time   COLORURINE YELLOW 08/06/2019 1413   APPEARANCEUR CLEAR 08/06/2019 1413   LABSPEC 1.035 (H) 08/06/2019 1413   PHURINE 5.0 08/06/2019 1413   GLUCOSEU >=500 (A) 08/06/2019 1413   HGBUR NEGATIVE 08/06/2019 1413   BILIRUBINUR NEGATIVE 08/06/2019 1413   BILIRUBINUR negative 07/13/2018 1651   KETONESUR 80 (A) 08/06/2019 1413   PROTEINUR NEGATIVE 08/06/2019 1413   UROBILINOGEN 0.2 07/13/2018 1651   UROBILINOGEN 1.0 07/09/2017 1324   NITRITE NEGATIVE 08/06/2019 1413   LEUKOCYTESUR NEGATIVE 08/06/2019 1413    Radiological Exams on Admission: DG Chest Port 1 View  Result Date: 08/23/2019 CLINICAL DATA:  Hematemesis. EXAM: PORTABLE CHEST 1 VIEW COMPARISON:  02/26/2019. FINDINGS: Mediastinum hilar structures normal. Low lung volumes. No focal infiltrate. No pleural effusion or pneumothorax. Heart size normal. No acute bony abnormality. IMPRESSION: Low lung volumes.  No acute cardiopulmonary disease identified. Electronically Signed   By: Maisie Fus  Register   On: 08/23/2019 10:05    EKG: Independently reviewed.  Sinus  tachycardia  Assessment/Plan Active Problems:   DKA (diabetic ketoacidoses) (HCC)  (please populate well all problems here in Problem List. (For example, if patient is on BP meds at home and you resume or decide to hold them, it is a problem that needs to be her. Same for CAD, COPD, HLD and so on)  DKA -Received 2 L of boluses, still looks dry we will give 1 more IV boluses and continue maintenance IV fluid -Insulin drip started with Endo tool -Frequent labs  Intractable nauseous vomiting abdominal pain -Also clinical resolution likely viral gastroenteritis however patient also has been taking marijuana since July on a weekly basis.  Education about marijuana use and cyclic vomiting syndrome -Gastric emptying study in 02/2019 negative for gastroparesis -Lipase is within normal limits, will check abdomen x-ray -Continue Bentyl, hold all stool softener and laxative -Recommend she follows with GI for colonoscopy  Seizure versus pseudoseizure -Suspect patient noncompliant with her seizure medication check Lamictal level -Continue her home seizure meds -Seizure precautions  Leukocytosis -Probably reactive, monitor off antibiotics  Hypertension -Fairly controlled, resume home BP meds when tolerates p.o.  Morbid obesity -Lifestyle modification    DVT prophylaxis: Lovenox Code Status: Full code Family Communication: None at bedside Disposition Plan: Expect discharge in 1 to 2 days after DKA resolved Consults called: None Admission status: Progressive care   Emeline General MD [facility] Pager [phone]  08/23/2019, 1:00 PM

## 2019-08-23 NOTE — Significant Event (Signed)
HOSPITAL MEDICINE OVERNIGHT EVENT NOTE    Notified by nursing that recent labs reveal anion gap has closed.  Beta hydroxybutyrate has normalized.    I have initiated the orderset to transition patient from an insulin drip to her home regimen of Lantus QHS.  While patient typically takes 70 units QHS, admission note states that oral intake is tenuous with nausea/vomting so therefore we will start with 50 units.  Marinda Elk  MD [facility]

## 2019-08-23 NOTE — Progress Notes (Signed)
Inpatient Diabetes Program Recommendations  AACE/ADA: New Consensus Statement on Inpatient Glycemic Control (2015)  Target Ranges:  Prepandial:   less than 140 mg/dL      Peak postprandial:   less than 180 mg/dL (1-2 hours)      Critically ill patients:  140 - 180 mg/dL   Results for Nancy [name], Nancy [name] (MRN [id number]) as of 08/23/2019 14:14  Ref. Range 08/23/2019 09:36 08/23/2019 13:41  Glucose-Capillary Latest Ref Range: 70 - 99 mg/dL 662 (H) 947 (H)  IV Insulin Drip Started   Results for Nancy [name], Nancy [name] (MRN [id number]) as of 08/23/2019 14:14  Ref. Range 07/13/2018 16:16 11/07/2018 18:04 12/19/2018 00:20 02/27/2019 02:10 08/06/2019 17:10  Hemoglobin A1C Latest Ref Range: 4.8 - 5.6 % 12.9 (A) 10.2 (H) 11.3 (H) 13.4 (H) 11.8 (H)    Admit with: DKA  History: DM  Home DM Meds: Lantus 70 units QHS       Novolog 15 units TID  Current Orders: IV Insulin Drip    Patient well known to the Inpatient Diabetes Team  This is pt's 4th admission since January 2021  Was counseled by the Diabetes Coordinator in both February and August of this year.  Consistently elevated A1cs noted despite attempts at counseling the pt on the importance of good CBG control.  Agree with initiation of the IV Insulin Drip.  Will follow and assist with insulin as needed.    --Will follow patient during hospitalization--  Ambrose Finland RN, MSN, CDE Diabetes Coordinator Inpatient Glycemic Control Team Team Pager: [phone] (8a-5p)

## 2019-08-23 NOTE — Progress Notes (Signed)
Arrived to place PIV per consult, RN states that patient just went to xray.  Requested that she re enter consult when patient has returned to room.

## 2019-08-23 NOTE — ED Triage Notes (Signed)
Pt has had several (pseudo) seizures yesterday and today witnessed by mother. Pt was incontinent of urine during most recent sx. Mom reports lasted about a minute. Bright red emesis this AM.  CBG 508, took lantus last night

## 2019-08-23 NOTE — ED Provider Notes (Signed)
[facility]-EMERGENCY DEPT [facility] Emergency Department Provider Note MRN:  [id number]  Arrival date & time: 08/23/19     Chief Complaint   Seizures   History of Present Illness   Nancy [name] is a 19 y.o. year-old female with a history of diabetes, seizure disorder, CHF presenting to the ED with chief complaint of seizures.  Multiple seizures over the past 24 hours.  Patient does not recall the seizures, her mom says that she was shaking all over and confused afterwards.  She is also been having chest pain and upper abdominal pain and has been having some nausea and vomiting with bright red blood in the vomit.  Denies diarrhea, no fever, no cough, no burning with urination.  Feels generally unwell, nauseated at this time.  Symptoms are constant, no exacerbating relieving factors.  Review of Systems  A complete 10 system review of systems was obtained and all systems are negative except as noted in the HPI and PMH.   Patient's Health History    Past Medical History:  Diagnosis Date  . Acanthosis nigricans   . Anxiety   . CHF (congestive heart failure) (HCC)   . Chronic lower back pain   . Depression   . DKA, type 1 (HCC) 09/13/2018  . Dyspepsia   . Obesity   . Ovarian cyst    pt is not aware of this hx (11/24/2017)  . 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 Premature baby   . Seizures (HCC)   . Type II diabetes mellitus (HCC)    insulin dependant    Past Surgical History:  Procedure Laterality Date  . ABDOMINAL HERNIA REPAIR     "I was a baby"  . BIOPSY  10/12/2018   Procedure: BIOPSY;  Surgeon: Lynann Bologna, MD;  Location: [facility] ENDOSCOPY;  Service: Endoscopy;;  . ESOPHAGOGASTRODUODENOSCOPY (EGD) WITH PROPOFOL N/A 10/12/2018   Procedure: ESOPHAGOGASTRODUODENOSCOPY (EGD) WITH PROPOFOL;  Surgeon: Lynann Bologna, MD;  Location: [facility] ENDOSCOPY;  Service: Endoscopy;  Laterality: N/A;  . HERNIA REPAIR    . LEFT HEART CATH AND CORONARY ANGIOGRAPHY N/A 10/13/2018   Procedure: LEFT HEART  CATH AND CORONARY ANGIOGRAPHY;  Surgeon: Kathleene Hazel, MD;  Location[facility] INVASIVE CV LAB;  Service: Cardiovascular;  Laterality: N/A;  . TONSILLECTOMY AND ADENOIDECTOMY    . WISDOM TOOTH EXTRACTION  2017    Family History  Problem Relation Age of Onset  . Diabetes Mother   . Hypertension Mother   . Obesity Mother   . Asthma Mother   . Allergic rhinitis Mother   . Eczema Mother   . Cervical cancer Mother   . Diabetes Father   . Hypertension Father   . Obesity Father   . Hyperlipidemia Father   . Hypertension Paternal Aunt   . Hypertension Maternal Grandfather   . Colon cancer Maternal Grandfather   . Diabetes Paternal Grandmother   . Obesity Paternal Grandmother   . Diabetes Paternal Grandfather   . Obesity Paternal Grandfather   . Angioedema Neg Hx   . Immunodeficiency Neg Hx   . Urticaria Neg Hx   . Stomach cancer Neg Hx   . Esophageal cancer Neg Hx     Social History   Socioeconomic History  . Marital status: Single    Spouse name: Not on file  . Number of children: 0  . Years of education: Not on file  . Highest education level: Not on file  Occupational History  . Occupation: Admission  Tobacco Use  . Smoking  status: Never Smoker  . Smokeless tobacco: Never Used  Vaping Use  . Vaping Use: Never used  Substance and Sexual Activity  . Alcohol use: No    Alcohol/week: 0.0 standard drinks  . Drug use: No  . Sexual activity: Not Currently  Other Topics Concern  . Not on file  Social History Narrative   Lives with mom and mom's girlfriend.   No EtOH, tobacco, Drugs   Social Determinants of Health   Financial Resource Strain:   . Difficulty of Paying Living Expenses:   Food Insecurity:   . Worried About Programme researcher, broadcasting/film/video in the Last Year:   . Barista in the Last Year:   Transportation Needs:   . Freight forwarder (Medical):   Marland Kitchen Lack of Transportation (Non-Medical):   Physical Activity:   . Days of Exercise per Week:   .  Minutes of Exercise per Session:   Stress:   . Feeling of Stress :   Social Connections:   . Frequency of Communication with Friends and Family:   . Frequency of Social Gatherings with Friends and Family:   . Attends Religious Services:   . Active Member of Clubs or Organizations:   . Attends Banker Meetings:   Marland Kitchen Marital Status:   Intimate Partner Violence:   . Fear of Current or Ex-Partner:   . Emotionally Abused:   Marland Kitchen Physically Abused:   . Sexually Abused:      Physical Exam   Vitals:   08/23/19 1000 08/23/19 1110  BP: 110/75 115/79  Pulse: (!) 122 (!) 122  Resp: 16 20  Temp:    SpO2: 100% 98%    CONSTITUTIONAL: Well-appearing, NAD NEURO:  Alert and oriented x 3, no focal deficits EYES:  eyes equal and reactive ENT/NECK:  no LAD, no JVD CARDIO: Tachycardic rate, well-perfused, normal S1 and S2 PULM:  CTAB no wheezing or rhonchi GI/GU:  normal bowel sounds, non-distended, non-tender MSK/SPINE:  No gross deformities, no edema SKIN:  no rash, atraumatic PSYCH:  Appropriate speech and behavior  *Additional and/or pertinent findings included in MDM below  Diagnostic and Interventional Summary    EKG Interpretation  Date/Time:  Tuesday August 23 2019 09:34:11 EDT Ventricular Rate:  119 PR Interval:    QRS Duration: 83 QT Interval:  319 QTC Calculation: 449 R Axis:   73 Text Interpretation: Sinus tachycardia Ventricular premature complex Consider right atrial enlargement ST elev, probable normal early repol pattern Confirmed by Kennis Carina [phone]) on 08/23/2019 9:59:37 AM      Labs Reviewed  CBC - Abnormal; Notable for the following components:      Result Value   WBC 18.4 (*)    RBC 5.57 (*)    MCH 25.5 (*)    All other components within normal limits  COMPREHENSIVE METABOLIC PANEL - Abnormal; Notable for the following components:   Sodium 133 (*)    Chloride 92 (*)    Glucose, Bld 428 (*)    AST 12 (*)    Anion gap 17 (*)    All other  components within normal limits  LACTIC ACID, PLASMA - Abnormal; Notable for the following components:   Lactic Acid, Venous 2.4 (*)    All other components within normal limits  CBG MONITORING, ED - Abnormal; Notable for the following components:   Glucose-Capillary 455 (*)    All other components within normal limits  I-STAT VENOUS BLOOD GAS, ED - Abnormal; Notable for the following  components:   pH, Ven 7.446 (*)    pCO2, Ven 41.7 (*)    pO2, Ven 31.0 (*)    Bicarbonate 28.7 (*)    Acid-Base Excess 4.0 (*)    Sodium 133 (*)    Calcium, Ion 1.04 (*)    HCT 47.0 (*)    Hemoglobin 16.0 (*)    All other components within normal limits  PROTIME-INR  APTT  LIPASE, BLOOD  URINALYSIS, ROUTINE W REFLEX MICROSCOPIC  BETA-HYDROXYBUTYRIC ACID  BETA-HYDROXYBUTYRIC ACID  I-STAT BETA HCG BLOOD, ED ([facility], [facility], [facility] ONLY)  TYPE AND SCREEN  TROPONIN I (HIGH SENSITIVITY)  TROPONIN I (HIGH SENSITIVITY)    DG Chest Port 1 View  Final Result      Medications  insulin regular, human (MYXREDLIN) 100 units/ 100 mL infusion (has no administration in time range)  lactated ringers infusion (has no administration in time range)  dextrose 5 % in lactated ringers infusion (has no administration in time range)  dextrose 50 % solution 0-50 mL (has no administration in time range)  potassium chloride 10 mEq in 100 mL IVPB (has no administration in time range)  sodium chloride 0.9 % bolus 1,000 mL (1,000 mLs Intravenous New Bag/Given 08/23/19 1105)  sodium chloride 0.9 % bolus 1,000 mL (1,000 mLs Intravenous New Bag/Given 08/23/19 1104)  ondansetron (ZOFRAN) injection 4 mg (4 mg Intravenous Given 08/23/19 1103)  HYDROmorphone (DILAUDID) injection 0.5 mg (0.5 mg Intravenous Given 08/23/19 1103)  famotidine (PEPCID) IVPB 20 mg premix (0 mg Intravenous Stopped 08/23/19 1138)     Procedures  /  Critical Care .Critical Care Performed by: Sabas Sous, MD Authorized by: Sabas Sous, MD   Critical care  provider statement:    Critical care time (minutes):  38   Critical care was necessary to treat or prevent imminent or life-threatening deterioration of the following conditions:  Metabolic crisis (Diabetic ketoacidosis)   Critical care was time spent personally by me on the following activities:  Discussions with consultants, evaluation of patient's response to treatment, examination of patient, ordering and performing treatments and interventions, ordering and review of laboratory studies, ordering and review of radiographic studies, pulse oximetry, re-evaluation of patient's condition, obtaining history from patient or surrogate and review of old charts    ED Course and Medical Decision Making  I have reviewed the triage vital signs, the nursing notes, and pertinent available records from the EMR.  Listed above are laboratory and imaging tests that I personally ordered, reviewed, and interpreted and then considered in my medical decision making (see below for details).  Considering DKA, metabolic disarray, Mallory-Weiss tear, upper GI bleed, seizure versus pseudoseizure seems the patient has a history of both.  Will monitor closely, awaiting laboratory assessment.     Labs reveal evidence of DKA, patient remains tachycardic and in pain will admit to medicine.  Elmer Sow. Pilar Plate, MD [facility] Emergency Medicine [facility] [email or web address]  Final Clinical Impressions(s) / ED Diagnoses     ICD-10-CM   1. Seizure-like activity (HCC)  R56.9   2. Diabetic ketoacidosis without coma associated with type 2 diabetes mellitus (HCC)  E11.10     ED Discharge Orders    None       Discharge Instructions Discussed with and Provided to Patient:   Discharge Instructions   None       Sabas Sous, MD 08/23/19 1230

## 2019-08-24 ENCOUNTER — Inpatient Hospital Stay (HOSPITAL_COMMUNITY): Payer: Medicaid Other

## 2019-08-24 DIAGNOSIS — E1165 Type 2 diabetes mellitus with hyperglycemia: Secondary | ICD-10-CM

## 2019-08-24 LAB — GLUCOSE, CAPILLARY
Glucose-Capillary: 281 mg/dL — ABNORMAL HIGH (ref 70–99)
Glucose-Capillary: 196 mg/dL — ABNORMAL HIGH (ref 70–99)

## 2019-08-24 LAB — BASIC METABOLIC PANEL
Anion gap: 8 (ref 5–15)
BUN: 9 mg/dL (ref 6–20)
CO2: 25 mmol/L (ref 22–32)
Calcium: 8.5 mg/dL — ABNORMAL LOW (ref 8.9–10.3)
Chloride: 104 mmol/L (ref 98–111)
Creatinine, Ser: 0.55 mg/dL (ref 0.44–1.00)
GFR calc Af Amer: >60 mL/min (ref >60)
GFR calc non Af Amer: >60 mL/min (ref >60)
Glucose, Bld: 220 mg/dL — ABNORMAL HIGH (ref 70–99)
Potassium: 3.7 mmol/L (ref 3.5–5.1)
Sodium: 137 mmol/L (ref 135–145)

## 2019-08-24 LAB — CBC
HCT: 39.5 % (ref 36.0–46.0)
Hemoglobin: 12.4 g/dL (ref 12.0–15.0)
MCH: 25.6 pg — ABNORMAL LOW (ref 26.0–34.0)
MCHC: 31.4 g/dL (ref 30.0–36.0)
MCV: 81.4 fL (ref 80.0–100.0)
Platelets: 313 10*3/uL (ref 150–400)
RBC: 4.85 MIL/uL (ref 3.87–5.11)
RDW: 15.7 % — ABNORMAL HIGH (ref 11.5–15.5)
WBC: 10.2 10*3/uL (ref 4.0–10.5)
nRBC: 0 % (ref 0.0–0.2)

## 2019-08-24 LAB — CBG MONITORING, ED
Glucose-Capillary: 151 mg/dL — ABNORMAL HIGH (ref 70–99)
Glucose-Capillary: 174 mg/dL — ABNORMAL HIGH (ref 70–99)
Glucose-Capillary: 209 mg/dL — ABNORMAL HIGH (ref 70–99)

## 2019-08-24 MED ORDER — INSULIN ASPART 100 UNIT/ML ~~LOC~~ SOLN
10.0000 [IU] | Freq: Three times a day (TID) | SUBCUTANEOUS | Status: DC
  Administered 2019-08-25 – 2019-08-29 (×4): 10 [IU] via SUBCUTANEOUS

## 2019-08-24 MED ORDER — MAGNESIUM CITRATE PO SOLN
1.0000 | Freq: Once | ORAL | Status: AC
  Administered 2019-08-24: 1 via ORAL
  Filled 2019-08-24: qty 296

## 2019-08-24 MED ORDER — OXYCODONE-ACETAMINOPHEN 5-325 MG PO TABS
1.0000 | ORAL_TABLET | Freq: Four times a day (QID) | ORAL | Status: DC | PRN
  Administered 2019-08-24 – 2019-08-29 (×15): 2 via ORAL
  Filled 2019-08-24 (×15): qty 2

## 2019-08-24 MED ORDER — PROMETHAZINE HCL 25 MG/ML IJ SOLN
12.5000 mg | Freq: Four times a day (QID) | INTRAMUSCULAR | Status: AC | PRN
  Administered 2019-08-24 – 2019-08-25 (×2): 12.5 mg via INTRAVENOUS
  Filled 2019-08-24 (×2): qty 1

## 2019-08-24 NOTE — ED Notes (Signed)
Attempted to call report to 3E; unsuccessful.

## 2019-08-24 NOTE — Progress Notes (Signed)
PROGRESS NOTE    Nancy [name]  YIF:[id number] DOB: [birth year] DOA: 08/23/2019 PCP: Fleet Contras, MD   Brief Narrative: Nancy [name] is a 19 y.o. 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 Patient presented secondary to nausea/vomiting and admitted initially for DKA. No acidemia/acidosis noted but treated with insulin drip for elevated anion gap and beta-hydroxybutyric acid likely in setting of persistent vomiting.   Assessment & Plan:   Active Problems:   Nausea and vomiting   Generalized abdominal pain   Chronic abdominal pain   Seizure-like activity (HCC)   Hyperglycemia due to diabetes mellitus (HCC)   Type 1 diabetes mellitus with ketoacidosis without coma (HCC)   Diabetes mellitus, type 1 Hyperglycemia Patient did not meet criteria for DKA. Uncontrolled. Hemoglobin A1C of 11.8%. Patient takes Lantus 70 units daily and Novolog 15 units TID with meals. Patient presented with a glucose of 428, pH of 7.446, CO2 of 24, Beta-hydroxybutyric acid of 2.2 and anion gap of 17. Patient managed initially insulin drip until acidosis resolved and anion gap closed and was transitioned to Lantus 50 units daily to account for vomiting and hospital diet.  -Continue Lantus 50 units and Novolog SSI -Add Novolog 10 units TID with meals if able to tolerate  Abdominal pain Chronic issue. No specific etiology but x-ray did show constipation. Unsure if this is the etiology but will manage with bowel movements -Percocet prn -Mag citrate  Nausea/vomiting Patient states she vomited this morning. -Continue antiemetic and diet  Seizures/pseudoseizures Patient with recurrent seizures per report. Appears to be secondary to patient's inability to take medication secondary to vomiting. -Continue Topmax  Adjustment  disorder -Continue Buspar, Lamictal and Seroquel   DVT prophylaxis: Lovenox Code Status:   Code Status: Full Code Family Communication: None at bedside Disposition Plan: Discharge in 24 hours if no longer vomiting and able to tolerate diet   Consultants:   None  Procedures:   None  Antimicrobials:  None    Subjective: Abdominal pain, nausea, vomiting this morning.   Objective: Vitals:   08/24/19 0400 08/24/19 0625 08/24/19 0842 08/24/19 1200  BP: 111/78 122/82 117/72 119/83  Pulse: 95 (!) 106 (!) 112 (!) 101  Resp: 16 18 (!) 22 20  Temp:      TempSrc:      SpO2: 97% 100% 100% 98%    Intake/Output Summary (Last 24 hours) at 08/24/2019 1219 Last data filed at 08/23/2019 1748 Gross per 24 hour  Intake 3200 ml  Output --  Net 3200 ml   There were no vitals filed for this visit.  Examination:  General exam: Appears calm and comfortable Respiratory system: Clear to auscultation. Respiratory effort normal. Cardiovascular system: S1 & S2 heard, RRR. No murmurs, rubs, gallops or clicks. Gastrointestinal system: Abdomen is nondistended, soft and tender. No organomegaly or masses felt. Normal bowel sounds heard. Central nervous system: Alert and oriented. No focal neurological deficits. Musculoskeletal: No edema. No calf tenderness Skin: No cyanosis. No rashes Psychiatry: Judgement and insight appear normal. Mood & affect appropriate.     Data Reviewed: I have personally reviewed following labs and imaging studies  CBC Lab Results  Component Value Date   WBC 10.2 08/24/2019   RBC 4.85 08/24/2019   HGB 12.4 08/24/2019   HCT 39.5 08/24/2019   MCV 81.4 08/24/2019   MCH 25.6 (L) 08/24/2019   PLT 313 08/24/2019   MCHC  31.4 08/24/2019   RDW 15.7 (H) 08/24/2019   LYMPHSABS 4.0 08/08/2019   MONOABS 1.4 (H) 08/08/2019   EOSABS 0.1 08/08/2019   BASOSABS 0.0 08/08/2019     Last metabolic panel Lab Results  Component Value Date   NA 137 08/24/2019   K 3.7  08/24/2019   CL 104 08/24/2019   CO2 25 08/24/2019   BUN 9 08/24/2019   CREATININE 0.55 08/24/2019   GLUCOSE 220 (H) 08/24/2019   GFRNONAA >60 08/24/2019   GFRAA >60 08/24/2019   CALCIUM 8.5 (L) 08/24/2019   PHOS 3.6 12/19/2018   PROT 8.1 08/23/2019   ALBUMIN 4.4 08/23/2019   LABGLOB 2.5 10/05/2018   AGRATIO 1.9 10/05/2018   BILITOT 1.2 08/23/2019   ALKPHOS 102 08/23/2019   AST 12 (L) 08/23/2019   ALT 20 08/23/2019   ANIONGAP 8 08/24/2019    CBG (last 3)  Recent Labs    08/23/19 2206 08/24/19 0030 08/24/19 0715  GLUCAP 167* 151* 174*     GFR: CrCl cannot be calculated (Unknown ideal weight.).  Coagulation Profile: Recent Labs  Lab 08/23/19 1020  INR 1.1    Recent Results (from the past 240 hour(s))  SARS Coronavirus 2 by RT PCR (hospital order, performed in [facility] hospital lab) Nasopharyngeal Nasopharyngeal Swab     Status: None   Collection Time: 08/23/19 12:32 PM   Specimen: Nasopharyngeal Swab  Result Value Ref Range Status   SARS Coronavirus 2 NEGATIVE NEGATIVE Final    Comment: (NOTE) SARS-CoV-2 target nucleic acids are NOT DETECTED.  The SARS-CoV-2 RNA is generally detectable in upper and lower respiratory specimens during the acute phase of infection. The lowest concentration of SARS-CoV-2 viral copies this assay can detect is 250 copies / mL. A negative result does not preclude SARS-CoV-2 infection and should not be used as the sole basis for treatment or other patient management decisions.  A negative result may occur with improper specimen collection / handling, submission of specimen other than nasopharyngeal swab, presence of viral mutation(s) within the areas targeted by this assay, and inadequate number of viral copies (<250 copies / mL). A negative result must be combined with clinical observations, patient history, and epidemiological information.  Fact Sheet for Patients:   BoilerBrush.com.cy  Fact Sheet for  Healthcare Providers: https://pope.com/  This test is not yet approved or  cleared by the [place] FDA and has been authorized for detection and/or diagnosis of SARS-CoV-2 by FDA under an Emergency Use Authorization (EUA).  This EUA will remain in effect (meaning this test can be used) for the duration of the COVID-19 declaration under Section 564(b)(1) of the Act, 21 U.S.C. section 360bbb-3(b)(1), unless the authorization is terminated or revoked sooner.  Performed at [facility], [address], [place], [place] [address]         Radiology Studies: DG Abd 1 View  Result Date: 08/23/2019 CLINICAL DATA:  Abdominal pain.  Nausea vomiting.  Diarrhea. EXAM: ABDOMEN - 1 VIEW COMPARISON:  CT 02/26/2019. FINDINGS: Soft tissue structures are unremarkable. No bowel distention. Stool noted throughout the colon. No free air identified. Hemidiaphragms not imaged. No acute bony abnormality identified. IMPRESSION: No acute abnormality identified. No bowel distention noted. Stool noted throughout colon. Electronically Signed   By: Maisie Fus  Register   On: 08/23/2019 13:25   DG Chest Port 1 View  Result Date: 08/23/2019 CLINICAL DATA:  Hematemesis. EXAM: PORTABLE CHEST 1 VIEW COMPARISON:  02/26/2019. FINDINGS: Mediastinum hilar structures normal. Low lung volumes. No  focal infiltrate. No pleural effusion or pneumothorax. Heart size normal. No acute bony abnormality. IMPRESSION: Low lung volumes.  No acute cardiopulmonary disease identified. Electronically Signed   By: Maisie Fus  Register   On: 08/23/2019 10:05        Scheduled Meds: . atorvastatin  20 mg Oral q1800  . busPIRone  10 mg Oral TID  . enoxaparin (LOVENOX) injection  40 mg Subcutaneous Q24H  . insulin aspart  0-15 Units Subcutaneous TID AC & HS  . insulin glargine  50 Units Subcutaneous Q24H  . lamoTRIgine  50 mg Oral TID  . lubiprostone  24 mcg Oral BID WC  . mirtazapine  7.5 mg Oral QHS  .  pantoprazole  40 mg Oral BID  . propranolol  20 mg Oral BID  . QUEtiapine  300 mg Oral QHS  . sucralfate  1 g Oral TID  . topiramate  25 mg Oral BID   Continuous Infusions: . dextrose 5% lactated ringers Stopped (08/24/19 0032)  . lactated ringers       LOS: 1 day     Jacquelin Hawking, MD [facility] 08/24/2019, 12:19 PM  If 7PM-7AM, please contact night-coverage [email or web address]

## 2019-08-24 NOTE — ED Notes (Signed)
Breakfast Ordered 

## 2019-08-24 NOTE — Progress Notes (Signed)
Inpatient Diabetes Program Recommendations  AACE/ADA: New Consensus Statement on Inpatient Glycemic Control (2015)  Target Ranges:  Prepandial:   less than 140 mg/dL      Peak postprandial:   less than 180 mg/dL (1-2 hours)      Critically ill patients:  140 - 180 mg/dL   Lab Results  Component Value Date   GLUCAP 174 (H) 08/24/2019   HGBA1C 11.8 (H) 08/06/2019    Review of Glycemic Control Results for Nancy [name], Nancy [name] (MRN [id number]) as of 08/24/2019 08:57  Ref. Range 08/23/2019 19:11 08/23/2019 20:50 08/23/2019 22:06 08/24/2019 00:30 08/24/2019 07:15  Glucose-Capillary Latest Ref Range: 70 - 99 mg/dL 628 (H) 315 (H) 176 (H) 151 (H) 174 (H)   Diabetes history:  DM1(does not make insulin.  Needs correction, basal and meal coverage)  Outpatient Diabetes medications:  Lantus 50 units QHS Novolog 10 units tid with meals if eats at least 50%   Current orders for Inpatient glycemic control:  Lantus 50 units QHS Novolog 0-15 tid & hs  Inpatient Diabetes Program Recommendations:    Novolog 10 units tid with meals if patient eats at least 50% of meal  Will continue to follow while inpatient.  Thank you, Dulce Sellar, RN, BSN Diabetes Coordinator Inpatient Diabetes Program [phone] (team pager from 8a-5p)

## 2019-08-25 LAB — COMPREHENSIVE METABOLIC PANEL
ALT: 14 U/L (ref 0–44)
AST: 17 U/L (ref 15–41)
Albumin: 3.3 g/dL — ABNORMAL LOW (ref 3.5–5.0)
Alkaline Phosphatase: 73 U/L (ref 38–126)
Anion gap: 9 (ref 5–15)
BUN: 7 mg/dL (ref 6–20)
CO2: 24 mmol/L (ref 22–32)
Calcium: 8.6 mg/dL — ABNORMAL LOW (ref 8.9–10.3)
Chloride: 101 mmol/L (ref 98–111)
Creatinine, Ser: 0.86 mg/dL (ref 0.44–1.00)
GFR calc Af Amer: >60 mL/min (ref >60)
GFR calc non Af Amer: >60 mL/min (ref >60)
Glucose, Bld: 200 mg/dL — ABNORMAL HIGH (ref 70–99)
Potassium: 3.6 mmol/L (ref 3.5–5.1)
Sodium: 134 mmol/L — ABNORMAL LOW (ref 135–145)
Total Bilirubin: 0.3 mg/dL (ref 0.3–1.2)
Total Protein: 5.9 g/dL — ABNORMAL LOW (ref 6.5–8.1)

## 2019-08-25 LAB — GLUCOSE, CAPILLARY
Glucose-Capillary: 204 mg/dL — ABNORMAL HIGH (ref 70–99)
Glucose-Capillary: 222 mg/dL — ABNORMAL HIGH (ref 70–99)
Glucose-Capillary: 307 mg/dL — ABNORMAL HIGH (ref 70–99)
Glucose-Capillary: 229 mg/dL — ABNORMAL HIGH (ref 70–99)

## 2019-08-25 MED ORDER — POLYETHYLENE GLYCOL 3350 17 G PO PACK
17.0000 g | PACK | Freq: Two times a day (BID) | ORAL | Status: DC
  Administered 2019-08-25 – 2019-08-30 (×11): 17 g via ORAL
  Filled 2019-08-25 (×12): qty 1

## 2019-08-25 MED ORDER — ONDANSETRON HCL 4 MG/2ML IJ SOLN
4.0000 mg | Freq: Four times a day (QID) | INTRAMUSCULAR | Status: DC | PRN
  Administered 2019-08-25 – 2019-09-03 (×9): 4 mg via INTRAVENOUS
  Filled 2019-08-25 (×9): qty 2

## 2019-08-25 MED ORDER — SODIUM CHLORIDE 0.45 % IV SOLN: INTRAVENOUS | Status: AC

## 2019-08-25 MED ORDER — BISACODYL 10 MG RE SUPP
10.0000 mg | Freq: Once | RECTAL | Status: AC
  Administered 2019-08-25: 10 mg via RECTAL
  Filled 2019-08-25: qty 1

## 2019-08-25 MED ORDER — DOCUSATE SODIUM 100 MG PO CAPS
100.0000 mg | ORAL_CAPSULE | Freq: Two times a day (BID) | ORAL | Status: DC
  Administered 2019-08-25 – 2019-08-28 (×7): 100 mg via ORAL
  Filled 2019-08-25 (×7): qty 1

## 2019-08-25 NOTE — Progress Notes (Signed)
PROGRESS NOTE    Nancy [name]  JAS:[id number] DOB: 06-Oct-2000 DOA: 08/23/2019 PCP: Fleet Contras, MD   Brief Narrative: Nancy [name] is a 18 y.o. 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 Patient presented secondary to nausea/vomiting and admitted initially for DKA. No acidemia/acidosis noted but treated with insulin drip for elevated anion gap and beta-hydroxybutyric acid likely in setting of persistent vomiting.   Assessment & Plan:   Active Problems:   Nausea and vomiting   Generalized abdominal pain   Chronic abdominal pain   Seizure-like activity (HCC)   Hyperglycemia due to diabetes mellitus (HCC)   Type 1 diabetes mellitus with ketoacidosis without coma (HCC)   Diabetes mellitus, type 1 Hyperglycemia Patient did not meet criteria for DKA. Uncontrolled. Hemoglobin A1C of 11.8%. Patient takes Lantus 70 units daily and Novolog 15 units TID with meals. Patient presented with a glucose of 428, pH of 7.446, CO2 of 24, Beta-hydroxybutyric acid of 2.2 and anion gap of 17. Patient managed initially insulin drip until acidosis resolved and anion gap closed and was transitioned to Lantus 50 units daily to account for vomiting and hospital diet.  -Continue Lantus 50 units and Novolog SSI -Continue Novolog 10 units TID with meals if able to tolerate  Abdominal pain Chronic issue. No specific etiology but x-ray did show constipation. Unsure if this is the etiology but will manage with bowel movements. Repeat x-ray without evidence of obstruction/perforation. -Percocet prn -Miralax, Colace scheduled  Nausea/vomiting Patient states she vomited this morning. 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 Appears to be secondary to  patient's inability to take medication secondary to vomiting. -Continue Topmax  Adjustment disorder -Continue Buspar, Lamictal and Seroquel   DVT prophylaxis: Lovenox Code Status:   Code Status: Full Code Family Communication: None at bedside Disposition Plan: Discharge home in 1-3 days pending improvement of abdominal pain, improvement of nausea/vomiting, ability to consistently tolerate adequate caloric intake   Consultants:   None  Procedures:   None  Antimicrobials:  None    Subjective: Continued abdominal pain. Diffuse in location. Also had some vomiting last night and continued nausea this morning. Has not eaten her breakfast for fear of vomiting.  Objective: Vitals:   08/24/19 1927 08/25/19 0015 08/25/19 0419 08/25/19 0859  BP: (!) 135/100 (!) 132/99 (!) 100/57 109/71  Pulse: 100 100 86 100  Resp:  19 19 18   Temp: 98.9 F (37.2 C) 98.9 F (37.2 C) 97.9 F (36.6 C) 98.3 F (36.8 C)  TempSrc: Oral  Oral Oral  SpO2: 100% 100% 100% 100%  Weight:   92.5 kg   Height:        Intake/Output Summary (Last 24 hours) at 08/25/2019 0950 Last data filed at 08/25/2019 0300 Gross per 24 hour  Intake 278.4 ml  Output --  Net 278.4 ml   Filed Weights   08/24/19 1602 08/25/19 0419  Weight: 93.6 kg 92.5 kg    Examination:  General exam: Appears calm and comfortable Respiratory system: Clear to auscultation. Respiratory effort normal. Cardiovascular system: S1 & S2 heard, RRR. No murmurs, rubs, gallops or clicks. Gastrointestinal system: Abdomen is nondistended, soft and generally tender. No organomegaly or masses felt. Normal bowel sounds heard. Central nervous system: Alert and oriented. No focal neurological deficits. Musculoskeletal: No edema. No calf tenderness Skin: No cyanosis. No rashes Psychiatry: Judgement and  insight appear normal. Mood & affect appropriate.      Data Reviewed: I have personally reviewed following labs and imaging studies  CBC Lab  Results  Component Value Date   WBC 10.2 08/24/2019   RBC 4.85 08/24/2019   HGB 12.4 08/24/2019   HCT 39.5 08/24/2019   MCV 81.4 08/24/2019   MCH 25.6 (L) 08/24/2019   PLT 313 08/24/2019   MCHC 31.4 08/24/2019   RDW 15.7 (H) 08/24/2019   LYMPHSABS 4.0 08/08/2019   MONOABS 1.4 (H) 08/08/2019   EOSABS 0.1 08/08/2019   BASOSABS 0.0 08/08/2019     Last metabolic panel Lab Results  Component Value Date   NA 137 08/24/2019   K 3.7 08/24/2019   CL 104 08/24/2019   CO2 25 08/24/2019   BUN 9 08/24/2019   CREATININE 0.55 08/24/2019   GLUCOSE 220 (H) 08/24/2019   GFRNONAA >60 08/24/2019   GFRAA >60 08/24/2019   CALCIUM 8.5 (L) 08/24/2019   PHOS 3.6 12/19/2018   PROT 8.1 08/23/2019   ALBUMIN 4.4 08/23/2019   LABGLOB 2.5 10/05/2018   AGRATIO 1.9 10/05/2018   BILITOT 1.2 08/23/2019   ALKPHOS 102 08/23/2019   AST 12 (L) 08/23/2019   ALT 20 08/23/2019   ANIONGAP 8 08/24/2019    CBG (last 3)  Recent Labs    08/24/19 1602 08/24/19 2108 08/25/19 0608  GLUCAP 281* 196* 204*     GFR: Estimated Creatinine Clearance: 120.1 mL/min (by C-G formula based on SCr of 0.55 mg/dL).  Coagulation Profile: Recent Labs  Lab 08/23/19 1020  INR 1.1    Recent Results (from the past 240 hour(s))  SARS Coronavirus 2 by RT PCR (hospital order, performed in [facility] hospital lab) Nasopharyngeal Nasopharyngeal Swab     Status: None   Collection Time: 08/23/19 12:32 PM   Specimen: Nasopharyngeal Swab  Result Value Ref Range Status   SARS Coronavirus 2 NEGATIVE NEGATIVE Final    Comment: (NOTE) SARS-CoV-2 target nucleic acids are NOT DETECTED.  The SARS-CoV-2 RNA is generally detectable in upper and lower respiratory specimens during the acute phase of infection. The lowest concentration of SARS-CoV-2 viral copies this assay can detect is 250 copies / mL. A negative result does not preclude SARS-CoV-2 infection and should not be used as the sole basis for treatment or other patient  management decisions.  A negative result may occur with improper specimen collection / handling, submission of specimen other than nasopharyngeal swab, presence of viral mutation(s) within the areas targeted by this assay, and inadequate number of viral copies (<250 copies / mL). A negative result must be combined with clinical observations, patient history, and epidemiological information.  Fact Sheet for Patients:   BoilerBrush.com.cy  Fact Sheet for Healthcare Providers: https://pope.com/  This test is not yet approved or  cleared by the [place] FDA and has been authorized for detection and/or diagnosis of SARS-CoV-2 by FDA under an Emergency Use Authorization (EUA).  This EUA will remain in effect (meaning this test can be used) for the duration of the COVID-19 declaration under Section 564(b)(1) of the Act, 21 U.S.C. section 360bbb-3(b)(1), unless the authorization is terminated or revoked sooner.  Performed at [facility], [address], [place], [place] [address]         Radiology Studies: DG Abd 1 View  Result Date: 08/23/2019 CLINICAL DATA:  Abdominal pain.  Nausea vomiting.  Diarrhea. EXAM: ABDOMEN - 1 VIEW COMPARISON:  CT 02/26/2019. FINDINGS: Soft tissue structures are unremarkable. No  bowel distention. Stool noted throughout the colon. No free air identified. Hemidiaphragms not imaged. No acute bony abnormality identified. IMPRESSION: No acute abnormality identified. No bowel distention noted. Stool noted throughout colon. Electronically Signed   By: Maisie Fus  Register   On: 08/23/2019 13:25   DG Chest Port 1 View  Result Date: 08/23/2019 CLINICAL DATA:  Hematemesis. EXAM: PORTABLE CHEST 1 VIEW COMPARISON:  02/26/2019. FINDINGS: Mediastinum hilar structures normal. Low lung volumes. No focal infiltrate. No pleural effusion or pneumothorax. Heart size normal. No acute bony abnormality. IMPRESSION: Low lung  volumes.  No acute cardiopulmonary disease identified. Electronically Signed   By: Maisie Fus  Register   On: 08/23/2019 10:05   DG ABD ACUTE 2+V W 1V CHEST  Result Date: 08/24/2019 CLINICAL DATA:  Generalized abdominal pain with nausea and vomiting. EXAM: DG ABDOMEN ACUTE W/ 1V CHEST COMPARISON:  August 23, 2019 FINDINGS: There is no evidence of dilated bowel loops or free intraperitoneal air. A large amount of stool is seen throughout the colon. No radiopaque calculi or other significant radiographic abnormality is seen. Heart size and mediastinal contours are within normal limits. Both lungs are clear. IMPRESSION: Negative abdominal radiographs.  No acute cardiopulmonary disease. Electronically Signed   By: Aram Candela M.D.   On: 08/24/2019 19:42        Scheduled Meds: . atorvastatin  20 mg Oral q1800  . busPIRone  10 mg Oral TID  . enoxaparin (LOVENOX) injection  40 mg Subcutaneous Q24H  . insulin aspart  0-15 Units Subcutaneous TID AC & HS  . insulin aspart  10 Units Subcutaneous TID WC  . insulin glargine  50 Units Subcutaneous Q24H  . lamoTRIgine  50 mg Oral TID  . lubiprostone  24 mcg Oral BID WC  . mirtazapine  7.5 mg Oral QHS  . pantoprazole  40 mg Oral BID  . propranolol  20 mg Oral BID  . QUEtiapine  300 mg Oral QHS  . sucralfate  1 g Oral TID  . topiramate  25 mg Oral BID   Continuous Infusions: . dextrose 5% lactated ringers Stopped (08/24/19 0032)  . lactated ringers       LOS: 2 days     Jacquelin Hawking, MD [facility] 08/25/2019, 9:50 AM  If 7PM-7AM, please contact night-coverage [email or web address]

## 2019-08-25 NOTE — Progress Notes (Signed)
   08/25/19 1305  Assess: MEWS Score  Temp 98.7 F (37.1 C)  BP 99/68  Pulse Rate 90  Resp 18  Level of Consciousness Alert  SpO2 100 %  O2 Device Room Air  Patient Activity (if Appropriate) In bed  O2 Flow Rate (L/min) 0 L/min  Assess: MEWS Score  MEWS Temp 0  MEWS Systolic 1  MEWS Pulse 0  MEWS RR 0  MEWS LOC 0  MEWS Score 1  MEWS Score Color Green  Assess: if the MEWS score is Yellow or Red  Were vital signs taken at a resting state? Yes  Focused Assessment Change from prior assessment (see assessment flowsheet)  Early Detection of Sepsis Score *See Row Information* Low  MEWS guidelines implemented *See Row Information* No, vital signs rechecked  Treat  MEWS Interventions Other (Comment) (Vitals were taken at a resting state)  Pain Scale 0-10  Pain Score 0   Patient is alert and oriented x 4 and is no apparent distress. Patient's current vitals at resting state.

## 2019-08-25 NOTE — Progress Notes (Signed)
   08/25/19 1227  Assess: MEWS Score  Temp 98.7 F (37.1 C)  BP 95/69  Pulse Rate 91  Resp (!) 24 (Patient was up walking )  Level of Consciousness Alert  SpO2 100 %  O2 Device Room Air  Assess: MEWS Score  MEWS Temp 0  MEWS Systolic 1  MEWS Pulse 0  MEWS RR 1  MEWS LOC 0  MEWS Score 2  MEWS Score Color Yellow   Patient was ambulating from the bed to the sink to take a bath.

## 2019-08-26 ENCOUNTER — Inpatient Hospital Stay (HOSPITAL_COMMUNITY): Payer: Medicaid Other

## 2019-08-26 LAB — GLUCOSE, CAPILLARY
Glucose-Capillary: 149 mg/dL — ABNORMAL HIGH (ref 70–99)
Glucose-Capillary: 205 mg/dL — ABNORMAL HIGH (ref 70–99)
Glucose-Capillary: 178 mg/dL — ABNORMAL HIGH (ref 70–99)
Glucose-Capillary: 243 mg/dL — ABNORMAL HIGH (ref 70–99)

## 2019-08-26 MED ORDER — IOHEXOL 9 MG/ML PO SOLN
500.0000 mL | ORAL | Status: AC
  Administered 2019-08-26 (×2): 500 mL via ORAL

## 2019-08-26 MED ORDER — SORBITOL 70 % SOLN
960.0000 mL | TOPICAL_OIL | Freq: Once | ORAL | Status: AC
  Administered 2019-08-27: 960 mL via RECTAL
  Filled 2019-08-26: qty 473

## 2019-08-26 NOTE — Progress Notes (Signed)
PROGRESS NOTE   Nancy [name]  TDV:[id number] DOB: 07-18-2000 DOA: 08/23/2019 PCP: Fleet Contras, MD   Brief Narrative: Nancy [name] is a 19 y.o. 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 Patient presented secondary to nausea/vomiting and admitted initially for DKA. No acidemia/acidosis noted but treated with insulin drip for elevated anion gap and beta-hydroxybutyric acid likely in setting of persistent vomiting.   Assessment & Plan:   Active Problems:   Nausea and vomiting   Generalized abdominal pain   Chronic abdominal pain   Seizure-like activity (HCC)   Hyperglycemia due to diabetes mellitus (HCC)   Type 1 diabetes mellitus with ketoacidosis without coma (HCC)   Diabetes mellitus, type 1 Hyperglycemia Patient did not meet criteria for DKA. Uncontrolled. Hemoglobin A1C of 11.8%. Patient takes Lantus 70 units daily and Novolog 15 units TID with meals. Patient presented with a glucose of 428, pH of 7.446, CO2 of 24, Beta-hydroxybutyric acid of 2.2 and anion gap of 17. Patient managed initially insulin drip until acidosis resolved and anion gap closed and was transitioned to Lantus 50 units daily to account for vomiting and hospital diet.  -Continue Lantus 50 units and Novolog SSI -Continue Novolog 10 units TID with meals if able to tolerate  Abdominal pain Chronic issue. No specific etiology but x-ray did show constipation. Unsure if this is the etiology but will manage with bowel movements. Repeat x-ray without evidence of obstruction/perforation. Patient is not passing gas with continued vomiting -Percocet prn -CT abdomen/pelvis; if negative for obstruction, will give a SMOG enema -Continue Miralax, Colace  Nausea/vomiting Patient states she vomited this morning. 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 Appears to be secondary to patient's inability to take medication secondary to vomiting. -Continue Topmax  Adjustment disorder -Continue Buspar, Lamictal and Seroquel   DVT prophylaxis: Lovenox Code Status:   Code Status: Full Code Family Communication: None at bedside Disposition Plan: Discharge home in 1-3 days pending improvement of abdominal pain, improvement of nausea/vomiting, ability to consistently tolerate adequate caloric intake   Consultants:   None  Procedures:   None  Antimicrobials:  None    Subjective: Continues to vomit. Last vomited 600 mL of previously ingested fluids.  Objective: Vitals:   08/25/19 1305 08/25/19 2035 08/26/19 0300 08/26/19 0436  BP: 99/68 117/64  (!) 99/53  Pulse: 90 (!) 103  84  Resp: 18 20  20   Temp: 98.7 F (37.1 C) 98.4 F (36.9 C)  98.3 F (36.8 C)  TempSrc: Oral Oral  Oral  SpO2: 100% 100%  100%  Weight:   95.2 kg   Height:        Intake/Output Summary (Last 24 hours) at 08/26/2019 0925 Last data filed at 08/26/2019 0801 Gross per 24 hour  Intake 2319.21 ml  Output 600 ml  Net 1719.21 ml   Filed Weights   08/24/19 1602 08/25/19 0419 08/26/19 0300  Weight: 93.6 kg 92.5 kg 95.2 kg    Examination:  General exam: Appears calm and comfortable Respiratory system: Clear to auscultation. Respiratory effort normal. Cardiovascular system: S1 & S2 heard, RRR. No murmurs, rubs, gallops or clicks. Gastrointestinal system: Abdomen is nondistended, soft and generally tender. No organomegaly or masses felt. Decreased bowel sounds heard. Central nervous system: Alert and oriented. No focal neurological deficits. Musculoskeletal: No edema. No calf tenderness Skin: No cyanosis. No  rashes Psychiatry: Judgement and insight appear normal. Mood & affect appropriate.       Data Reviewed: I have personally reviewed following labs and  imaging studies  CBC Lab Results  Component Value Date   WBC 10.2 08/24/2019   RBC 4.85 08/24/2019   HGB 12.4 08/24/2019   HCT 39.5 08/24/2019   MCV 81.4 08/24/2019   MCH 25.6 (L) 08/24/2019   PLT 313 08/24/2019   MCHC 31.4 08/24/2019   RDW 15.7 (H) 08/24/2019   LYMPHSABS 4.0 08/08/2019   MONOABS 1.4 (H) 08/08/2019   EOSABS 0.1 08/08/2019   BASOSABS 0.0 08/08/2019     Last metabolic panel Lab Results  Component Value Date   NA 134 (L) 08/25/2019   K 3.6 08/25/2019   CL 101 08/25/2019   CO2 24 08/25/2019   BUN 7 08/25/2019   CREATININE 0.86 08/25/2019   GLUCOSE 200 (H) 08/25/2019   GFRNONAA >60 08/25/2019   GFRAA >60 08/25/2019   CALCIUM 8.6 (L) 08/25/2019   PHOS 3.6 12/19/2018   PROT 5.9 (L) 08/25/2019   ALBUMIN 3.3 (L) 08/25/2019   LABGLOB 2.5 10/05/2018   AGRATIO 1.9 10/05/2018   BILITOT 0.3 08/25/2019   ALKPHOS 73 08/25/2019   AST 17 08/25/2019   ALT 14 08/25/2019   ANIONGAP 9 08/25/2019    CBG (last 3)  Recent Labs    08/25/19 1723 08/25/19 2053 08/26/19 0614  GLUCAP 307* 229* 149*     GFR: Estimated Creatinine Clearance: 113.5 mL/min (by C-G formula based on SCr of 0.86 mg/dL).  Coagulation Profile: Recent Labs  Lab 08/23/19 1020  INR 1.1    Recent Results (from the past 240 hour(s))  SARS Coronavirus 2 by RT PCR (hospital order, performed in [facility] hospital lab) Nasopharyngeal Nasopharyngeal Swab     Status: None   Collection Time: 08/23/19 12:32 PM   Specimen: Nasopharyngeal Swab  Result Value Ref Range Status   SARS Coronavirus 2 NEGATIVE NEGATIVE Final    Comment: (NOTE) SARS-CoV-2 target nucleic acids are NOT DETECTED.  The SARS-CoV-2 RNA is generally detectable in upper and lower respiratory specimens during the acute phase of infection. The lowest concentration of SARS-CoV-2 viral copies this assay can detect is 250 copies / mL. A negative result does not preclude SARS-CoV-2 infection and should not be used as the sole  basis for treatment or other patient management decisions.  A negative result may occur with improper specimen collection / handling, submission of specimen other than nasopharyngeal swab, presence of viral mutation(s) within the areas targeted by this assay, and inadequate number of viral copies (<250 copies / mL). A negative result must be combined with clinical observations, patient history, and epidemiological information.  Fact Sheet for Patients:   BoilerBrush.com.cy  Fact Sheet for Healthcare Providers: https://pope.com/  This test is not yet approved or  cleared by the [place] FDA and has been authorized for detection and/or diagnosis of SARS-CoV-2 by FDA under an Emergency Use Authorization (EUA).  This EUA will remain in effect (meaning this test can be used) for the duration of the COVID-19 declaration under Section 564(b)(1) of the Act, 21 U.S.C. section 360bbb-3(b)(1), unless the authorization is terminated or revoked sooner.  Performed at [facility], [address], [place], [place] [address]         Radiology Studies: DG ABD ACUTE 2+V W 1V CHEST  Result Date: 08/24/2019 CLINICAL DATA:  Generalized abdominal pain with nausea and vomiting. EXAM: DG ABDOMEN ACUTE W/ 1V  CHEST COMPARISON:  August 23, 2019 FINDINGS: There is no evidence of dilated bowel loops or free intraperitoneal air. A large amount of stool is seen throughout the colon. No radiopaque calculi or other significant radiographic abnormality is seen. Heart size and mediastinal contours are within normal limits. Both lungs are clear. IMPRESSION: Negative abdominal radiographs.  No acute cardiopulmonary disease. Electronically Signed   By: Aram Candela M.D.   On: 08/24/2019 19:42        Scheduled Meds:  atorvastatin  20 mg Oral q1800   busPIRone  10 mg Oral TID   docusate sodium  100 mg Oral BID   enoxaparin (LOVENOX) injection  40  mg Subcutaneous Q24H   insulin aspart  0-15 Units Subcutaneous TID AC & HS   insulin aspart  10 Units Subcutaneous TID WC   insulin glargine  50 Units Subcutaneous Q24H   lamoTRIgine  50 mg Oral TID   lubiprostone  24 mcg Oral BID WC   mirtazapine  7.5 mg Oral QHS   pantoprazole  40 mg Oral BID   polyethylene glycol  17 g Oral BID   propranolol  20 mg Oral BID   QUEtiapine  300 mg Oral QHS   sucralfate  1 g Oral TID   topiramate  25 mg Oral BID   Continuous Infusions:  sodium chloride 100 mL/hr at 08/26/19 0300   dextrose 5% lactated ringers Stopped (08/24/19 0032)   lactated ringers       LOS: 3 days     Jacquelin Hawking, MD [facility] 08/26/2019, 9:25 AM  If 7PM-7AM, please contact night-coverage [email or web address]

## 2019-08-27 LAB — BASIC METABOLIC PANEL
Anion gap: 7 (ref 5–15)
BUN: 16 mg/dL (ref 6–20)
CO2: 27 mmol/L (ref 22–32)
Calcium: 8.8 mg/dL — ABNORMAL LOW (ref 8.9–10.3)
Chloride: 101 mmol/L (ref 98–111)
Creatinine, Ser: 0.57 mg/dL (ref 0.44–1.00)
GFR calc Af Amer: >60 mL/min (ref >60)
GFR calc non Af Amer: >60 mL/min (ref >60)
Glucose, Bld: 131 mg/dL — ABNORMAL HIGH (ref 70–99)
Potassium: 4.3 mmol/L (ref 3.5–5.1)
Sodium: 135 mmol/L (ref 135–145)

## 2019-08-27 LAB — GLUCOSE, CAPILLARY
Glucose-Capillary: 135 mg/dL — ABNORMAL HIGH (ref 70–99)
Glucose-Capillary: 112 mg/dL — ABNORMAL HIGH (ref 70–99)
Glucose-Capillary: 224 mg/dL — ABNORMAL HIGH (ref 70–99)
Glucose-Capillary: 237 mg/dL — ABNORMAL HIGH (ref 70–99)

## 2019-08-27 LAB — MAGNESIUM: Magnesium: 2.2 mg/dL (ref 1.7–2.4)

## 2019-08-27 NOTE — Progress Notes (Signed)
Administered approximately 700 ml of 960 ml dose of SMOG enema.  Pt not able to tolerate any more.  Pt rechecked for BM.  Pt states that she did not pass a drop of anything. This RN will make MD aware. Pt resting with call bell within reach.  Will continue to monitor.  Thomas Hoff, RN

## 2019-08-27 NOTE — Progress Notes (Signed)
PROGRESS NOTE   TANIYA DASHER  ZOX:[id number] DOB: September 21, 2000 DOA: 08/23/2019 PCP: Fleet Contras, MD   Brief Narrative: LORELY BUBB is a 19 y.o. 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 Patient presented secondary to nausea/vomiting and admitted initially for DKA. No acidemia/acidosis noted but treated with insulin drip for elevated anion gap and beta-hydroxybutyric acid likely in setting of persistent vomiting.   Assessment & Plan:   Active Problems:   Nausea and vomiting   Generalized abdominal pain   Chronic abdominal pain   Seizure-like activity (HCC)   Hyperglycemia due to diabetes mellitus (HCC)   Type 1 diabetes mellitus with ketoacidosis without coma (HCC)   Diabetes mellitus, type 1 Hyperglycemia Patient did not meet criteria for DKA. Uncontrolled. Hemoglobin A1C of 11.8%. Patient takes Lantus 70 units daily and Novolog 15 units TID with meals. Patient presented with a glucose of 428, pH of 7.446, CO2 of 24, Beta-hydroxybutyric acid of 2.2 and anion gap of 17. Patient managed initially insulin drip until acidosis resolved and anion gap closed and was transitioned to Lantus 50 units daily to account for vomiting and hospital diet.  -Continue Lantus 50 units and Novolog SSI -Continue Novolog 10 units TID with meals if able to tolerate  Abdominal pain Chronic issue. No specific etiology but x-ray did show constipation. Unsure if this is the etiology but will manage with bowel movements. Repeat x-ray without evidence of obstruction/perforation. Patient is not passing gas with continued vomiting. CT scan unremarkable for etiology. 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 Appears to be secondary to patient's inability to take medication secondary to vomiting. -Continue Topmax  Adjustment disorder -Continue Buspar, Lamictal and Seroquel   DVT prophylaxis: Lovenox Code Status:   Code Status: Full Code Family Communication: None at bedside Disposition Plan: Discharge home in 1-3 days pending improvement of abdominal pain, improvement of nausea/vomiting, ability to consistently tolerate adequate caloric intake   Consultants:   None  Procedures:   None  Antimicrobials:  None    Subjective: Continued vomiting today. Refused enema yesterday secondary to wanting to administer it herself.  Objective: Vitals:   08/26/19 0436 08/26/19 1115 08/26/19 1948 08/27/19 0526  BP: (!) 99/53 (!) 96/54 123/78 99/61  Pulse: 84 87 98 85  Resp: 20 17 16 16   Temp: 98.3 F (36.8 C) 98.5 F (36.9 C) 98.7 F (37.1 C) 97.8 F (36.6 C)  TempSrc: Oral Oral Oral Oral  SpO2: 100% 100% 100% 100%  Weight:    96.6 kg  Height:        Intake/Output Summary (Last 24 hours) at 08/27/2019 0943 Last data filed at 08/27/2019 0700 Gross per 24 hour  Intake 360 ml  Output 1700 ml  Net -1340 ml   Filed Weights   08/25/19 0419 08/26/19 0300 08/27/19 0526  Weight: 92.5 kg 95.2 kg 96.6 kg    Examination:  General exam: Appears calm and comfortable. Appears unwell. Respiratory system: Clear to auscultation. Respiratory effort normal. Cardiovascular system: S1 & S2 heard, RRR. No murmurs, rubs, gallops or clicks. Gastrointestinal system: Abdomen is nondistended, soft and is generally mildly tender. No organomegaly or masses felt. Normal bowel sounds heard. Central nervous system: Alert and oriented.  No focal neurological deficits. Musculoskeletal: No edema. No calf tenderness Skin: No cyanosis. No rashes Psychiatry: Judgement and insight appear normal.  Mood & affect appropriate.        Data Reviewed: I have personally reviewed following labs and imaging studies  CBC Lab Results  Component Value Date   WBC 10.2 08/24/2019   RBC 4.85 08/24/2019   HGB 12.4 08/24/2019   HCT 39.5 08/24/2019   MCV 81.4 08/24/2019   MCH 25.6 (L) 08/24/2019   PLT 313 08/24/2019   MCHC 31.4 08/24/2019   RDW 15.7 (H) 08/24/2019   LYMPHSABS 4.0 08/08/2019   MONOABS 1.4 (H) 08/08/2019   EOSABS 0.1 08/08/2019   BASOSABS 0.0 08/08/2019     Last metabolic panel Lab Results  Component Value Date   NA 134 (L) 08/25/2019   K 3.6 08/25/2019   CL 101 08/25/2019   CO2 24 08/25/2019   BUN 7 08/25/2019   CREATININE 0.86 08/25/2019   GLUCOSE 200 (H) 08/25/2019   GFRNONAA >60 08/25/2019   GFRAA >60 08/25/2019   CALCIUM 8.6 (L) 08/25/2019   PHOS 3.6 12/19/2018   PROT 5.9 (L) 08/25/2019   ALBUMIN 3.3 (L) 08/25/2019   LABGLOB 2.5 10/05/2018   AGRATIO 1.9 10/05/2018   BILITOT 0.3 08/25/2019   ALKPHOS 73 08/25/2019   AST 17 08/25/2019   ALT 14 08/25/2019   ANIONGAP 9 08/25/2019    CBG (last 3)  Recent Labs    08/26/19 1643 08/26/19 2115 08/27/19 0702  GLUCAP 178* 243* 135*     GFR: Estimated Creatinine Clearance: 114.5 mL/min (by C-G formula based on SCr of 0.86 mg/dL).  Coagulation Profile: Recent Labs  Lab 08/23/19 1020  INR 1.1    Recent Results (from the past 240 hour(s))  SARS Coronavirus 2 by RT PCR (hospital order, performed in [facility] hospital lab) Nasopharyngeal Nasopharyngeal Swab     Status: None   Collection Time: 08/23/19 12:32 PM   Specimen: Nasopharyngeal Swab  Result Value Ref Range Status   SARS Coronavirus 2 NEGATIVE NEGATIVE Final    Comment: (NOTE) SARS-CoV-2 target nucleic acids are NOT DETECTED.  The SARS-CoV-2 RNA is generally detectable in upper and lower respiratory specimens during the acute phase of infection. The lowest concentration of SARS-CoV-2 viral copies this assay can detect is 250 copies /  mL. A negative result does not preclude SARS-CoV-2 infection and should not be used as the sole basis for treatment or other patient management decisions.  A negative result may occur with improper specimen collection / handling, submission of specimen other than nasopharyngeal swab, presence of viral mutation(s) within the areas targeted by this assay, and inadequate number of viral copies (<250 copies / mL). A negative result must be combined with clinical observations, patient history, and epidemiological information.  Fact Sheet for Patients:   BoilerBrush.com.cy  Fact Sheet for Healthcare Providers: https://pope.com/  This test is not yet approved or  cleared by the [place] FDA and has been authorized for detection and/or diagnosis of SARS-CoV-2 by FDA under an Emergency Use Authorization (EUA).  This EUA will remain in effect (meaning this test can be used) for the duration of the COVID-19 declaration under Section 564(b)(1) of the Act, 21 U.S.C. section 360bbb-3(b)(1), unless the authorization is terminated or revoked sooner.  Performed at [facility], [address], [place], [place] [address]         Radiology Studies: CT ABDOMEN PELVIS WO CONTRAST  Result Date: 08/26/2019 CLINICAL DATA:  Diffuse abdominal pain, nausea, vomiting, diarrhea EXAM: CT ABDOMEN AND PELVIS WITHOUT CONTRAST TECHNIQUE: Multidetector CT imaging of the abdomen and pelvis was performed following the standard protocol without IV contrast. COMPARISON:  08/24/2019, 02/26/2019 FINDINGS: Lower chest: No acute pleural or parenchymal lung disease. Hepatobiliary: No focal liver abnormality is seen. No gallstones, gallbladder wall thickening, or biliary dilatation. Pancreas: Unremarkable. No pancreatic ductal dilatation or surrounding inflammatory changes. Spleen: Normal in size without focal abnormality. Adrenals/Urinary Tract: Adrenal glands are  unremarkable. Kidneys are normal, without renal calculi, focal lesion, or hydronephrosis. Bladder is unremarkable. Stomach/Bowel: No bowel obstruction or ileus. No wall thickening or inflammatory change. Normal appendix right lower quadrant. Vascular/Lymphatic: No significant vascular findings are present. No enlarged abdominal or pelvic lymph nodes. Reproductive: Uterus and bilateral adnexa are unremarkable. Other: No free fluid or free gas.  No abdominal wall hernia. Musculoskeletal: No acute or destructive bony lesions. Reconstructed images demonstrate no additional findings. IMPRESSION: 1. No acute intra-abdominal or intrapelvic process. Electronically Signed   By: Sharlet Salina M.D.   On: 08/26/2019 15:05        Scheduled Meds: . atorvastatin  20 mg Oral q1800  . busPIRone  10 mg Oral TID  . docusate sodium  100 mg Oral BID  . enoxaparin (LOVENOX) injection  40 mg Subcutaneous Q24H  . insulin aspart  0-15 Units Subcutaneous TID AC & HS  . insulin aspart  10 Units Subcutaneous TID WC  . insulin glargine  50 Units Subcutaneous Q24H  . lamoTRIgine  50 mg Oral TID  . lubiprostone  24 mcg Oral BID WC  . mirtazapine  7.5 mg Oral QHS  . pantoprazole  40 mg Oral BID  . polyethylene glycol  17 g Oral BID  . propranolol  20 mg Oral BID  . QUEtiapine  300 mg Oral QHS  . sorbitol, milk of mag, mineral oil, glycerin (SMOG) enema  960 mL Rectal Once  . sucralfate  1 g Oral TID  . topiramate  25 mg Oral BID   Continuous Infusions: . dextrose 5% lactated ringers Stopped (08/24/19 0032)  . lactated ringers       LOS: 4 days     Jacquelin Hawking, MD [facility] 08/27/2019, 9:43 AM  If 7PM-7AM, please contact night-coverage [email or web address]

## 2019-08-28 DIAGNOSIS — K5900 Constipation, unspecified: Secondary | ICD-10-CM

## 2019-08-28 DIAGNOSIS — R1084 Generalized abdominal pain: Secondary | ICD-10-CM

## 2019-08-28 DIAGNOSIS — R109 Unspecified abdominal pain: Secondary | ICD-10-CM

## 2019-08-28 DIAGNOSIS — G8929 Other chronic pain: Secondary | ICD-10-CM

## 2019-08-28 DIAGNOSIS — R112 Nausea with vomiting, unspecified: Secondary | ICD-10-CM

## 2019-08-28 LAB — GLUCOSE, CAPILLARY
Glucose-Capillary: 129 mg/dL — ABNORMAL HIGH (ref 70–99)
Glucose-Capillary: 130 mg/dL — ABNORMAL HIGH (ref 70–99)
Glucose-Capillary: 171 mg/dL — ABNORMAL HIGH (ref 70–99)
Glucose-Capillary: 195 mg/dL — ABNORMAL HIGH (ref 70–99)
Glucose-Capillary: 261 mg/dL — ABNORMAL HIGH (ref 70–99)
Glucose-Capillary: 299 mg/dL — ABNORMAL HIGH (ref 70–99)

## 2019-08-28 LAB — BASIC METABOLIC PANEL
Anion gap: 9 (ref 5–15)
BUN: 11 mg/dL (ref 6–20)
CO2: 25 mmol/L (ref 22–32)
Calcium: 9.2 mg/dL (ref 8.9–10.3)
Chloride: 104 mmol/L (ref 98–111)
Creatinine, Ser: 0.72 mg/dL (ref 0.44–1.00)
GFR calc Af Amer: >60 mL/min (ref >60)
GFR calc non Af Amer: >60 mL/min (ref >60)
Glucose, Bld: 140 mg/dL — ABNORMAL HIGH (ref 70–99)
Potassium: 4 mmol/L (ref 3.5–5.1)
Sodium: 138 mmol/L (ref 135–145)

## 2019-08-28 LAB — MAGNESIUM: Magnesium: 2.1 mg/dL (ref 1.7–2.4)

## 2019-08-28 MED ORDER — SODIUM CHLORIDE 0.9 % IV SOLN
300.0000 mg | Freq: Three times a day (TID) | INTRAVENOUS | Status: DC
  Administered 2019-08-28 – 2019-08-30 (×7): 300 mg via INTRAVENOUS
  Filled 2019-08-28 (×9): qty 6

## 2019-08-28 MED ORDER — METOCLOPRAMIDE HCL 5 MG/ML IJ SOLN
10.0000 mg | Freq: Three times a day (TID) | INTRAMUSCULAR | Status: DC
  Administered 2019-08-28 – 2019-09-04 (×21): 10 mg via INTRAVENOUS
  Filled 2019-08-28 (×21): qty 2

## 2019-08-28 MED ORDER — PROMETHAZINE HCL 25 MG/ML IJ SOLN
12.5000 mg | Freq: Four times a day (QID) | INTRAMUSCULAR | Status: DC | PRN
  Administered 2019-08-28 – 2019-08-31 (×7): 12.5 mg via INTRAVENOUS
  Filled 2019-08-28 (×4): qty 1

## 2019-08-28 MED ORDER — PROMETHAZINE HCL 25 MG/ML IJ SOLN: 12.5000 mg | Freq: Four times a day (QID) | INTRAMUSCULAR | Status: DC | PRN

## 2019-08-28 NOTE — Progress Notes (Signed)
   08/28/19 1057  Assess: MEWS Score  Temp 99 F (37.2 C)  BP 105/69  Pulse Rate (!) 117  Resp 16  SpO2 100 %  O2 Device Room Air  Assess: MEWS Score  MEWS Temp 0  MEWS Systolic 0  MEWS Pulse 2  MEWS RR 0  MEWS LOC 0  MEWS Score 2  MEWS Score Color Yellow  Assess: if the MEWS score is Yellow or Red  Were vital signs taken at a resting state? Yes  Focused Assessment No change from prior assessment  Early Detection of Sepsis Score *See Row Information* Low  MEWS guidelines implemented *See Row Information* Yes  Treat  MEWS Interventions Administered scheduled meds/treatments;Administered prn meds/treatments  Pain Scale 0-10  Pain Score 5  Pain Type Chronic pain  Pain Location Abdomen  Pain Onset On-going  Pain Intervention(s) Medication (See eMAR)  Multiple Pain Sites No  Complains of Nausea /  Vomiting  Interventions Medication (see MAR)  Nausea relieved by Antiemetic  Take Vital Signs  Increase Vital Sign Frequency  Yellow: Q 2hr X 2 then Q 4hr X 2, if remains yellow, continue Q 4hrs  Escalate  MEWS: Escalate Yellow: discuss with charge nurse/RN and consider discussing with provider and RRT  Notify: Charge Nurse/RN  Name of Charge Nurse/RN Notified Monette  Date Charge Nurse/RN Notified 08/28/19  Time Charge Nurse/RN Notified 1128  Document  Patient Outcome Stabilized after interventions  Progress note created (see row info) Yes   Patient continues to have multiple episodes of N/V. GI consulted and medications adjusted. Scheduled betablocker also given to help with HR. Will increase vital signs and continue to monitor

## 2019-08-28 NOTE — Progress Notes (Signed)
PROGRESS NOTE   Nancy [name]  HYI:[id number] DOB: [birth year] DOA: 08/23/2019 PCP: Fleet Contras, MD   Brief Narrative: Nancy [name] is a 19 y.o. 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 Patient presented secondary to nausea/vomiting and admitted initially for DKA. No acidemia/acidosis noted but treated with insulin drip for elevated anion gap and beta-hydroxybutyric acid likely in setting of persistent vomiting.   Assessment & Plan:   Active Problems:   Nausea and vomiting   Generalized abdominal pain   Chronic abdominal pain   Seizure-like activity (HCC)   Hyperglycemia due to diabetes mellitus (HCC)   Type 1 diabetes mellitus with ketoacidosis without coma (HCC)   Diabetes mellitus, type 1 Hyperglycemia Patient did not meet criteria for DKA. Uncontrolled. Hemoglobin A1C of 11.8%. Patient takes Lantus 70 units daily and Novolog 15 units TID with meals. Patient presented with a glucose of 428, pH of 7.446, CO2 of 24, Beta-hydroxybutyric acid of 2.2 and anion gap of 17. Patient managed initially insulin drip until acidosis resolved and anion gap closed and was transitioned to Lantus 50 units daily to account for vomiting and hospital diet.  -Continue Lantus 50 units and Novolog SSI -Continue Novolog 10 units TID with meals if able to tolerate  Abdominal pain Chronic issue. No specific etiology but x-ray did show constipation. Unsure if this is the etiology but will manage with bowel movements. Repeat x-ray without evidence of obstruction/perforation. Patient is not passing gas with continued vomiting. CT scan unremarkable for etiology. SMOG enema did not produce a bowel movement -Percocet prn -Continue Miralax, Colace -GI consulted. Recommendations.  Nausea/vomiting Continues to vomit. Patient has had a  normal gastric emptying study from 03/2019 and multiple EGDs without etiology. 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 Appears to be secondary to patient's inability to take medication secondary to vomiting. -Continue Topmax  Adjustment disorder -Continue Buspar, Lamictal and Seroquel   DVT prophylaxis: Lovenox Code Status:   Code Status: Full Code Family Communication: None at bedside Disposition Plan: Discharge home in 1-3 days pending improvement of abdominal pain, improvement of nausea/vomiting, ability to consistently tolerate adequate caloric intake   Consultants:   Gastroenterology  Procedures:   None  Antimicrobials:  None    Subjective: Continued vomiting. No bowel movement  Objective: Vitals:   08/27/19 2000 08/27/19 2335 08/28/19 1057 08/28/19 1304  BP: 119/77 126/79 105/69 98/64  Pulse: 92 100 (!) 117 96  Resp: 18 16 16 18   Temp: 98.8 F (37.1 C) 98 F (36.7 C) 99 F (37.2 C) 98.7 F (37.1 C)  TempSrc: Oral Oral Oral Oral  SpO2: 100% 100% 100% 100%  Weight:      Height:        Intake/Output Summary (Last 24 hours) at 08/28/2019 1527 Last data filed at 08/28/2019 1500 Gross per 24 hour  Intake 1020 ml  Output 400 ml  Net 620 ml   Filed Weights   08/25/19 0419 08/26/19 0300 08/27/19 0526  Weight: 92.5 kg 95.2 kg 96.6 kg    Examination:  General exam: Appears calm and comfortable Respiratory system: Clear to auscultation. Respiratory effort normal. Cardiovascular system: S1 & S2 heard, RRR. No murmurs, rubs, gallops or clicks. Gastrointestinal system: Abdomen is nondistended, soft and tender. No organomegaly or masses felt. Normal bowel sounds heard. Central nervous system: Alert and oriented. No  focal neurological deficits. Musculoskeletal: No edema. No calf tenderness Skin: No cyanosis. No rashes Psychiatry: Judgement and insight appear  normal. Mood & affect appropriate.         Data Reviewed: I have personally reviewed following labs and imaging studies  CBC Lab Results  Component Value Date   WBC 10.2 08/24/2019   RBC 4.85 08/24/2019   HGB 12.4 08/24/2019   HCT 39.5 08/24/2019   MCV 81.4 08/24/2019   MCH 25.6 (L) 08/24/2019   PLT 313 08/24/2019   MCHC 31.4 08/24/2019   RDW 15.7 (H) 08/24/2019   LYMPHSABS 4.0 08/08/2019   MONOABS 1.4 (H) 08/08/2019   EOSABS 0.1 08/08/2019   BASOSABS 0.0 08/08/2019     Last metabolic panel Lab Results  Component Value Date   NA 138 08/28/2019   K 4.0 08/28/2019   CL 104 08/28/2019   CO2 25 08/28/2019   BUN 11 08/28/2019   CREATININE 0.72 08/28/2019   GLUCOSE 140 (H) 08/28/2019   GFRNONAA >60 08/28/2019   GFRAA >60 08/28/2019   CALCIUM 9.2 08/28/2019   PHOS 3.6 12/19/2018   PROT 5.9 (L) 08/25/2019   ALBUMIN 3.3 (L) 08/25/2019   LABGLOB 2.5 10/05/2018   AGRATIO 1.9 10/05/2018   BILITOT 0.3 08/25/2019   ALKPHOS 73 08/25/2019   AST 17 08/25/2019   ALT 14 08/25/2019   ANIONGAP 9 08/28/2019    CBG (last 3)  Recent Labs    08/28/19 0005 08/28/19 0607 08/28/19 1232  GLUCAP 261* 129* 171*     GFR: Estimated Creatinine Clearance: 123.1 mL/min (by C-G formula based on SCr of 0.72 mg/dL).  Coagulation Profile: Recent Labs  Lab 08/23/19 1020  INR 1.1    Recent Results (from the past 240 hour(s))  SARS Coronavirus 2 by RT PCR (hospital order, performed in [facility] hospital lab) Nasopharyngeal Nasopharyngeal Swab     Status: None   Collection Time: 08/23/19 12:32 PM   Specimen: Nasopharyngeal Swab  Result Value Ref Range Status   SARS Coronavirus 2 NEGATIVE NEGATIVE Final    Comment: (NOTE) SARS-CoV-2 target nucleic acids are NOT DETECTED.  The SARS-CoV-2 RNA is generally detectable in upper and lower respiratory specimens during the acute phase of infection. The lowest concentration of SARS-CoV-2 viral copies this assay can detect is 250 copies /  mL. A negative result does not preclude SARS-CoV-2 infection and should not be used as the sole basis for treatment or other patient management decisions.  A negative result may occur with improper specimen collection / handling, submission of specimen other than nasopharyngeal swab, presence of viral mutation(s) within the areas targeted by this assay, and inadequate number of viral copies (<250 copies / mL). A negative result must be combined with clinical observations, patient history, and epidemiological information.  Fact Sheet for Patients:   BoilerBrush.com.cy  Fact Sheet for Healthcare Providers: https://pope.com/  This test is not yet approved or  cleared by the [place] FDA and has been authorized for detection and/or diagnosis of SARS-CoV-2 by FDA under an Emergency Use Authorization (EUA).  This EUA will remain in effect (meaning this test can be used) for the duration of the COVID-19 declaration under Section 564(b)(1) of the Act, 21 U.S.C. section 360bbb-3(b)(1), unless the authorization is terminated or revoked sooner.  Performed at [facility], [address] [place], [place] [address]         Radiology Studies: No results found.      Scheduled Meds: . atorvastatin  20  mg Oral q1800  . busPIRone  10 mg Oral TID  . enoxaparin (LOVENOX) injection  40 mg Subcutaneous Q24H  . insulin aspart  0-15 Units Subcutaneous TID AC & HS  . insulin aspart  10 Units Subcutaneous TID WC  . insulin glargine  50 Units Subcutaneous Q24H  . lamoTRIgine  50 mg Oral TID  . metoCLOPramide (REGLAN) injection  10 mg Intravenous Q8H  . mirtazapine  7.5 mg Oral QHS  . pantoprazole  40 mg Oral BID  . polyethylene glycol  17 g Oral BID  . propranolol  20 mg Oral BID  . QUEtiapine  300 mg Oral QHS  . topiramate  25 mg Oral BID   Continuous Infusions: . dextrose 5% lactated ringers Stopped (08/24/19 0032)  .  erythromycin 300 mg (08/28/19 1159)  . lactated ringers       LOS: 5 days     Jacquelin Hawking, MD [facility] 08/28/2019, 3:27 PM  If 7PM-7AM, please contact night-coverage [email or web address]

## 2019-08-28 NOTE — Consult Note (Signed)
Referring Provider:  Dr. Tish Frederickson, [facility]         Primary Care Physician:  Fleet Contras, MD Primary Gastroenterologist: Armbruster            Reason for Consultation: n/v, constipation                  ASSESSMENT /  PLAN   19 year old female with a history of uncontrolled insulin-dependent diabetes, chronic nausea with vomiting, chronic constipation and chronic abdominal pain, hypertension, seizure disorder and anxiety with depression admitted with hyperglycemia, nausea, vomiting and abdominal pain  1. N/V --she has had imaging and prior upper endoscopy, all unrevealing.  Her gastric emptying scan was normal in March but I still have suspicion that she has impaired gastric emptying related to her uncontrolled diabetes.  This is likely intermittent and certainly having exacerbation now.  Narcotic pain medications are definitely not helping her nausea, vomiting and constipation. --We will try IV Reglan --Begin liquid erythromycin /kg IV every 8 hrs for gastric stimulation/gastroparesis flare --Can alternate ondansetron with promethazine, IV if not tolerating p.o. --Avoid narcotics if possible --No plan to repeat EGD at this time  2. Constipation --unable to tolerate significant p.o. at present due to vomiting.  Enema was unsuccessful.  My plan is to try to control nausea and vomiting first before we consider additional medications to stimulate bowel movement.  I am going to hold Colace, sucralfate and Amitiza for now given vomiting (trying to minimize oral meds until vomiting controlled)    HPI:     Nancy [name] is a 19 y.o. female with a past medical history of uncontrolled insulin-dependent diabetes with multiple admissions for DKA, history of chronic nausea and vomiting, chronic constipation and abdominal pain.  She also has a listed history of hypertension, seizures, conversion disorder and depression.  She was last seen in our office by Dr. Adela Lank on  03/21/2019.  At that time he addressed her chronic nausea with vomiting, constipation abdominal pain.  It was felt that her uncontrolled diabetes was likely driving a significant portion of her gastrointestinal symptoms including contributing to probable gastroparesis.  Though she did have a normal gastric emptying study in March 2021.  At that visit her Reglan was stopped because it was not helpful.  Zofran was stopped because it was not helpful.  She was continued on promethazine, buspirone and Remeron.  Amitiza was stopped due to lack of efficacy and he tried her on Motegrity.  She has had multiple CT scans of her abdomen pelvis and most recently 2 days ago.  This most recent CT was unremarkable.  Abdominal x-ray 2 days before the recent CT +1 view chest was also normal.  She had an EGD performed in the hospital setting in October 2020 for nausea vomiting and abdominal pain.  There were small white plaques in the distal esophagus but biopsies showed no histologic abnormality.  The fungal stain was negative.  There was some retained food in the stomach and mild gastritis.  Stomach biopsies were normal without H. pylori.  Small bowel appeared normal and biopsies were normal as well.  She was admitted on 08/23/2019 with 6 days of generalized abdominal pain, nausea and vomiting.  There was also question of seizure activity at home.  On admission she did not meet criteria for DKA though she was initially managed with insulin drip until acidosis resolved and anion gap was closed.  She has now been resumed on Lantus and NovoLog insulin.  Smog enema was attempted yesterday but reportedly without benefit.  She is receiving MiraLAX and Colace.  She is also receiving Bentyl 10 mg 3 times daily, buspirone 10 mg 3 times daily, Amitiza 24 mcg twice daily, pantoprazole 40 mg twice daily and sucralfate 1 g 3 times daily.  She currently is also receiving oxycodone-acetaminophen 5 mg 1 to 2 tablets every 6 hours as  needed.  She reports now her biggest issue continues to be vomiting.  She has near constant nausea but will vomit within 3 minutes of trying to eat anything solid.  She had recently vomited I was with her.  Emesis nonbloody.  She has diffuse abdominal pain and intermittent abdominal spasms.  She reports having no result with smog enema.  She reports Amitiza has not been helpful at all nor was Motegrity.  She has tried MiraLAX, stool softeners, colonoscopy preparations has not had meaningful bowel movement.  She reports normally she will only have a bowel movement about twice per month.  She was having loose stools 1 week ago prior to hospitalization.  Stools were nonbloody and nonmelenic.    Past Medical History:  Diagnosis Date  . Acanthosis nigricans   . Anxiety   . CHF (congestive heart failure) (HCC)   . Chronic lower back pain   . Depression   . DKA, type 1 (HCC) 09/13/2018  . Dyspepsia   . Obesity   . Ovarian cyst    pt is not aware of this hx (11/24/2017)  . 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 Premature baby   . Seizures (HCC)   . Type II diabetes mellitus (HCC)    insulin dependant    Past Surgical History:  Procedure Laterality Date  . ABDOMINAL HERNIA REPAIR     "I was a baby"  . BIOPSY  10/12/2018   Procedure: BIOPSY;  Surgeon: Lynann Bologna, MD;  Location: [facility] ENDOSCOPY;  Service: Endoscopy;;  . ESOPHAGOGASTRODUODENOSCOPY (EGD) WITH PROPOFOL N/A 10/12/2018   Procedure: ESOPHAGOGASTRODUODENOSCOPY (EGD) WITH PROPOFOL;  Surgeon: Lynann Bologna, MD;  Location: [facility] ENDOSCOPY;  Service: Endoscopy;  Laterality: N/A;  . HERNIA REPAIR    . LEFT HEART CATH AND CORONARY ANGIOGRAPHY N/A 10/13/2018   Procedure: LEFT HEART CATH AND CORONARY ANGIOGRAPHY;  Surgeon: Kathleene Hazel, MD;  Location: [facility] INVASIVE CV LAB;  Service: Cardiovascular;  Laterality: N/A;  . TONSILLECTOMY AND ADENOIDECTOMY    . WISDOM TOOTH EXTRACTION  2017    Prior to Admission medications   Medication Sig Start  Date End Date Taking? Authorizing Provider  ARIPiprazole (ABILIFY) 10 MG tablet Take 20 mg by mouth in the morning and at bedtime.   Yes [provider]  atorvastatin (LIPITOR) 20 MG tablet Take 1 tablet (20 mg total) by mouth daily at 6 PM. Patient taking differently: Take 20 mg by mouth in the morning and at bedtime.  10/05/18  Yes Storm Frisk, MD  busPIRone (BUSPAR) 10 MG tablet Take 10 mg by mouth 3 (three) times daily.  11/19/18  Yes [provider]  Caraway Oil-Levomenthol (FDGARD) 25-20.75 MG CAPS Take 2 capsules by mouth as needed. Take as directed as needed Patient taking differently: Take 2 capsules by mouth in the morning and at bedtime.  03/21/19  Yes Armbruster, Willaim Rayas, MD  Diclofenac Sodium (VOLTAREN EX) Apply 1 application topically 2 (two) times daily as needed (chest pains).   Yes [provider]  dicyclomine (BENTYL) 10 MG capsule Take 10 mg by mouth in the morning, at noon,  and at bedtime.  07/05/19  Yes [provider]  famotidine (PEPCID) 40 MG tablet Take 40 mg by mouth 2 (two) times daily.   Yes [provider]  hydrOXYzine (ATARAX/VISTARIL) 50 MG tablet Take 1 tablet (50 mg total) by mouth 3 (three) times daily as needed for anxiety. Patient taking differently: Take 50 mg by mouth in the morning, at noon, and at bedtime.  11/11/18  Yes Nwoko, Nicole Kindred I, NP  insulin aspart (NOVOLOG FLEXPEN) 100 UNIT/ML FlexPen Inject 15 Units into the skin daily.    Yes [provider]  lamoTRIgine (LAMICTAL) 25 MG tablet Take 2 tablets (50 mg total) by mouth 2 (two) times daily. For mood stabilization Patient taking differently: Take 50 mg by mouth in the morning, at noon, and at bedtime. For mood stabilization 11/11/18  Yes Nwoko, Agnes I, NP  LANTUS SOLOSTAR 100 UNIT/ML Solostar Pen Inject 70 Units into the skin at bedtime.  07/05/19  Yes [provider]  lidocaine (XYLOCAINE) 2 % solution 3 mLs as needed for mouth pain.   Yes  [provider]  lubiprostone (AMITIZA) 24 MCG capsule Take 1 capsule (24 mcg total) by mouth 2 (two) times daily with a meal. Patient taking differently: Take 24 mcg by mouth in the morning, at noon, and at bedtime.  10/27/18  Yes Esterwood, Amy S, PA-C  mirtazapine (REMERON) 7.5 MG tablet Take 1 tablet (7.5 mg total) by mouth at bedtime. For depression/sleep 11/11/18  Yes Armandina Stammer I, NP  Multiple Vitamin (MULTIVITAMIN WITH MINERALS) TABS tablet Take 1 tablet by mouth daily. 09/18/18  Yes Clapacs, Jackquline Denmark, MD  pantoprazole (PROTONIX) 40 MG tablet Take 1 tablet (40 mg total) by mouth 2 (two) times daily. For acid reflux 01/29/19  Yes Jae Dire, MD  polyethylene glycol (MIRALAX / GLYCOLAX) 17 g packet Take 17 g by mouth daily. Patient taking differently: Take 17 g by mouth daily as needed for moderate constipation.  12/24/18  Yes Swayze, Ava, DO  potassium chloride SA (KLOR-CON) 20 MEQ tablet Take 40 mEq by mouth 2 (two) times daily.   Yes [provider]  prochlorperazine (COMPAZINE) 25 MG suppository Place 1 suppository (25 mg total) rectally every 12 (twelve) hours as needed for nausea or vomiting. 12/05/18  Yes Melene Plan, DO  promethazine (PHENERGAN) 25 MG suppository Place 25 mg rectally every 8 (eight) hours as needed for nausea or vomiting.   Yes [provider]  propranolol (INDERAL) 20 MG tablet Take 1 tablet (20 mg total) by mouth 2 (two) times daily. For anxiety/HTN Patient taking differently: Take 20 mg by mouth 3 (three) times daily. For anxiety/HTN 12/23/18  Yes Swayze, Ava, DO  QUEtiapine (SEROQUEL) 100 MG tablet Take 300 mg by mouth at bedtime. 07/05/19  Yes [provider]  QUEtiapine (SEROQUEL) 50 MG tablet Take 50 mg by mouth 2 (two) times daily.   Yes [provider]  sucralfate (CARAFATE) 1 GM/10ML suspension Take 1 g by mouth 2 (two) times daily.  07/05/19  Yes [provider]  topiramate (TOPAMAX) 25 MG tablet Take 1  tablet (25 mg total) by mouth 2 (two) times daily. For seizure activities 11/11/18  Yes Armandina Stammer I, NP  bisacodyl (DULCOLAX) 10 MG suppository Place 1 suppository (10 mg total) rectally daily. Patient not taking: Reported on 08/23/2019 12/24/18   Swayze, Ava, DO  Prucalopride Succinate (MOTEGRITY) 2 MG TABS Take 1 tablet (2 mg total) by mouth daily. Patient not taking: Reported on 08/23/2019  03/21/19   Armbruster, Willaim Rayas, MD    Current Facility-Administered Medications  Medication Dose Route Frequency Provider Last Rate Last Admin  . atorvastatin (LIPITOR) tablet 20 mg  20 mg Oral q1800 Mikey College T, MD   20 mg at 08/27/19 1916  . busPIRone (BUSPAR) tablet 10 mg  10 mg Oral TID Emeline General, MD   10 mg at 08/28/19 2992  . dextrose 5 % in lactated ringers infusion   Intravenous Continuous Sabas Sous, MD   Stopped at 08/24/19 0032  . dextrose 50 % solution 0-50 mL  0-50 mL Intravenous PRN Sabas Sous, MD      . dicyclomine (BENTYL) capsule 10 mg  10 mg Oral TID PRN Emeline General, MD   10 mg at 08/26/19 1702  . docusate sodium (COLACE) capsule 100 mg  100 mg Oral BID Narda Bonds, MD   100 mg at 08/28/19 4268  . enoxaparin (LOVENOX) injection 40 mg  40 mg Subcutaneous Q24H Mikey College T, MD   40 mg at 08/27/19 1356  . hydrOXYzine (ATARAX/VISTARIL) tablet 50 mg  50 mg Oral TID PRN Emeline General, MD   50 mg at 08/26/19 1701  . insulin aspart (novoLOG) injection 0-15 Units  0-15 Units Subcutaneous TID AC & HS Shalhoub, Deno Lunger, MD   2 Units at 08/28/19 [phone]  . insulin aspart (novoLOG) injection 10 Units  10 Units Subcutaneous TID WC Narda Bonds, MD   10 Units at 08/28/19 [phone]  . insulin glargine (LANTUS) injection 50 Units  50 Units Subcutaneous Q24H Marinda Elk, MD   50 Units at 08/27/19 2140  . lactated ringers infusion   Intravenous Continuous Sabas Sous, MD      . lamoTRIgine (LAMICTAL) tablet 50 mg  50 mg Oral TID Emeline General, MD   50 mg at 08/28/19 0839  .  lubiprostone (AMITIZA) capsule 24 mcg  24 mcg Oral BID WC Emeline General, MD   24 mcg at 08/28/19 [phone]  . mirtazapine (REMERON) tablet 7.5 mg  7.5 mg Oral QHS Mikey College T, MD   7.5 mg at 08/27/19 2344  . ondansetron (ZOFRAN) injection 4 mg  4 mg Intravenous Q6H PRN Narda Bonds, MD   4 mg at 08/28/19 0553  . oxyCODONE-acetaminophen (PERCOCET/ROXICET) 5-325 MG per tablet 1-2 tablet  1-2 tablet Oral Q6H PRN Narda Bonds, MD   2 tablet at 08/28/19 0735  . pantoprazole (PROTONIX) EC tablet 40 mg  40 mg Oral BID Mikey College T, MD   40 mg at 08/28/19 [phone]  . polyethylene glycol (MIRALAX / GLYCOLAX) packet 17 g  17 g Oral BID Narda Bonds, MD   17 g at 08/28/19 9417  . propranolol (INDERAL) tablet 20 mg  20 mg Oral BID Mikey College T, MD   20 mg at 08/27/19 2232  . QUEtiapine (SEROQUEL) tablet 300 mg  300 mg Oral QHS Mikey College T, MD   300 mg at 08/27/19 2345  . sucralfate (CARAFATE) 1 GM/10ML suspension 1 g  1 g Oral TID Mikey College T, MD   1 g at 08/28/19 [phone]  . topiramate (TOPAMAX) tablet 25 mg  25 mg Oral BID Mikey College T, MD   25 mg at 08/28/19 0840    Allergies as of 08/23/2019 - Review Complete 08/23/2019  Allergen Reaction Noted  . Ibuprofen Other (See Comments) 01/16/2018  . Oatmeal Rash 11/06/2018    Family History  Problem Relation Age of Onset  . Diabetes Mother   . Hypertension Mother   . Obesity Mother   . Asthma Mother   . Allergic rhinitis Mother   . Eczema Mother   . Cervical cancer Mother   . Diabetes Father   . Hypertension Father   . Obesity Father   . Hyperlipidemia Father   . Hypertension Paternal Aunt   . Hypertension Maternal Grandfather   . Colon cancer Maternal Grandfather   . Diabetes Paternal Grandmother   . Obesity Paternal Grandmother   . Diabetes Paternal Grandfather   . Obesity Paternal Grandfather   . Angioedema Neg Hx   . Immunodeficiency Neg Hx   . Urticaria Neg Hx   . Stomach cancer Neg Hx   . Esophageal cancer Neg Hx     Social  History   Socioeconomic History  . Marital status: Single    Spouse name: Not on file  . Number of children: 0  . Years of education: Not on file  . Highest education level: Not on file  Occupational History  . Occupation: Admission  Tobacco Use  . Smoking status: Never Smoker  . Smokeless tobacco: Never Used  Vaping Use  . Vaping Use: Never used  Substance and Sexual Activity  . Alcohol use: No    Alcohol/week: 0.0 standard drinks  . Drug use: No  . Sexual activity: Not Currently  Other Topics Concern  . Not on file  Social History Narrative   Lives with mom and mom's girlfriend.   No EtOH, tobacco, Drugs      Review of Systems: All systems reviewed and negative except where noted in HPI.  Physical Exam: Vital signs in last 24 hours: Temp:  [98 F (36.7 C)-98.8 F (37.1 C)] 98 F (36.7 C) (08/21 2335) Pulse Rate:  [92-100] 100 (08/21 2335) Resp:  [16-18] 16 (08/21 2335) BP: (119-126)/(77-79) 126/79 (08/21 2335) SpO2:  [100 %] 100 % (08/21 2335) Last BM Date: 08/21/19 (pt has had numerous meds to help with this, but sitll no BM) General:   Awake, alert, NAD, sitting on the side of the bed holding an emesis bag Psych:  Pleasant, cooperative.     Neck:  Supple Lungs:  Clear throughout to auscultation.   No wheezes, crackles, or rhonchi.  Heart: Mild tachycardia, regular, no MRG  Abdomen:  Soft, obese, diffusely tender without rebound or guarding, bowel sounds present   Rectal:  Deferred  Msk:  Symmetrical without gross deformities. . Neurologic:  Alert and  oriented x4;  grossly normal neurologically. Skin:  Intact without significant lesions or rashes.   Intake/Output from previous day: 08/21 0701 - 08/22 0700 In: 920 [P.O.:920] Out: 400 [Urine:400] Intake/Output this shift: No intake/output data recorded.  Lab Results: No results for input(s): WBC, HGB, HCT, PLT in the last 72 hours. BMET Recent Labs    08/25/19 1029 08/27/19 1031 08/28/19 0543   NA 134* 135 138  K 3.6 4.3 4.0  CL 101 101 104  CO2 24 27 25   GLUCOSE 200* 131* 140*  BUN 7 16 11   CREATININE 0.86 0.57 0.72  CALCIUM 8.6* 8.8* 9.2   LFT Recent Labs    08/25/19 1029  PROT 5.9*  ALBUMIN 3.3*  AST 17  ALT 14  ALKPHOS 73  BILITOT 0.3   . CBC Latest Ref Rng & Units 08/24/2019 08/23/2019 08/23/2019  WBC 4.0 - 10.5 K/uL 10.2 - 18.4(H)  Hemoglobin 12.0 - 15.0 g/dL 08/25/2019 16.0(H) 14.2  Hematocrit  36 - 46 % 39.5 47.0(H) 45.3  Platelets 150 - 400 K/uL 313 - 391    . CMP Latest Ref Rng & Units 08/28/2019 08/27/2019 08/25/2019  Glucose 70 - 99 mg/dL 161(W) 960(A) 540(J)  BUN 6 - 20 mg/dL Creatinine 0.44 - 1.00 mg/dL 8.11 9.14 7.82  Sodium 135 - 145 mmol/L 138 135 134(L)  Potassium 3.5 - 5.1 mmol/L 4.0 4.3 3.6  Chloride 98 - 111 mmol/L 104 101 101  CO2 22 - 32 mmol/L Calcium 8.9 - 10.3 mg/dL 9.2 9.5(A) 2.1(H)  Total Protein 6.5 - 8.1 g/dL - - 5.9(L)  Total Bilirubin 0.3 - 1.2 mg/dL - - 0.3  Alkaline Phos 38 - 126 U/L - - 73  AST 15 - 41 U/L - - 17  ALT 0 - 44 U/L - - 14   Studies/Results: CT ABDOMEN PELVIS WO CONTRAST  Result Date: 08/26/2019 CLINICAL DATA:  Diffuse abdominal pain, nausea, vomiting, diarrhea EXAM: CT ABDOMEN AND PELVIS WITHOUT CONTRAST TECHNIQUE: Multidetector CT imaging of the abdomen and pelvis was performed following the standard protocol without IV contrast. COMPARISON:  08/24/2019, 02/26/2019 FINDINGS: Lower chest: No acute pleural or parenchymal lung disease. Hepatobiliary: No focal liver abnormality is seen. No gallstones, gallbladder wall thickening, or biliary dilatation. Pancreas: Unremarkable. No pancreatic ductal dilatation or surrounding inflammatory changes. Spleen: Normal in size without focal abnormality. Adrenals/Urinary Tract: Adrenal glands are unremarkable. Kidneys are normal, without renal calculi, focal lesion, or hydronephrosis. Bladder is unremarkable. Stomach/Bowel: No bowel obstruction or ileus. No wall  thickening or inflammatory change. Normal appendix right lower quadrant. Vascular/Lymphatic: No significant vascular findings are present. No enlarged abdominal or pelvic lymph nodes. Reproductive: Uterus and bilateral adnexa are unremarkable. Other: No free fluid or free gas.  No abdominal wall hernia. Musculoskeletal: No acute or destructive bony lesions. Reconstructed images demonstrate no additional findings. IMPRESSION: 1. No acute intra-abdominal or intrapelvic process. Electronically Signed   By: Sharlet Salina M.D.   On: 08/26/2019 15:05    Active Problems:   Nausea and vomiting   Generalized abdominal pain   Chronic abdominal pain   Seizure-like activity (HCC)   Hyperglycemia due to diabetes mellitus (HCC)   Type 1 diabetes mellitus with ketoacidosis without coma (HCC)    Carie Caddy. Shatori Bertucci, M.D. @  08/28/2019, 9:35 AM

## 2019-08-29 LAB — GLUCOSE, CAPILLARY
Glucose-Capillary: 78 mg/dL (ref 70–99)
Glucose-Capillary: 228 mg/dL — ABNORMAL HIGH (ref 70–99)
Glucose-Capillary: 268 mg/dL — ABNORMAL HIGH (ref 70–99)
Glucose-Capillary: 97 mg/dL (ref 70–99)

## 2019-08-29 LAB — BASIC METABOLIC PANEL
Anion gap: 9 (ref 5–15)
BUN: 16 mg/dL (ref 6–20)
CO2: 25 mmol/L (ref 22–32)
Calcium: 9 mg/dL (ref 8.9–10.3)
Chloride: 103 mmol/L (ref 98–111)
Creatinine, Ser: 0.78 mg/dL (ref 0.44–1.00)
GFR calc Af Amer: >60 mL/min (ref >60)
GFR calc non Af Amer: >60 mL/min (ref >60)
Glucose, Bld: 77 mg/dL (ref 70–99)
Potassium: 3.9 mmol/L (ref 3.5–5.1)
Sodium: 137 mmol/L (ref 135–145)

## 2019-08-29 LAB — MAGNESIUM: Magnesium: 2.1 mg/dL (ref 1.7–2.4)

## 2019-08-29 MED ORDER — DEXTROSE-NACL 5-0.45 % IV SOLN: INTRAVENOUS | Status: DC

## 2019-08-29 MED ORDER — INSULIN ASPART 100 UNIT/ML ~~LOC~~ SOLN
10.0000 [IU] | Freq: Three times a day (TID) | SUBCUTANEOUS | Status: DC
  Administered 2019-08-31 – 2019-09-04 (×11): 10 [IU] via SUBCUTANEOUS

## 2019-08-29 MED ORDER — PROMETHAZINE HCL 25 MG/ML IJ SOLN
12.5000 mg | Freq: Four times a day (QID) | INTRAMUSCULAR | Status: DC
  Administered 2019-08-29 – 2019-08-31 (×7): 12.5 mg via INTRAVENOUS
  Filled 2019-08-29 (×10): qty 1

## 2019-08-29 MED ORDER — INSULIN ASPART 100 UNIT/ML ~~LOC~~ SOLN
0.0000 [IU] | Freq: Three times a day (TID) | SUBCUTANEOUS | Status: DC
  Administered 2019-08-30: 3 [IU] via SUBCUTANEOUS
  Administered 2019-08-30: 2 [IU] via SUBCUTANEOUS
  Administered 2019-08-30 – 2019-08-31 (×2): 3 [IU] via SUBCUTANEOUS

## 2019-08-29 MED ORDER — MORPHINE SULFATE (PF) 2 MG/ML IV SOLN
2.0000 mg | INTRAVENOUS | Status: DC | PRN
  Administered 2019-08-29 – 2019-08-31 (×7): 2 mg via INTRAVENOUS
  Filled 2019-08-29 (×8): qty 1

## 2019-08-29 NOTE — Progress Notes (Addendum)
Daily Rounding Note  08/29/2019, 11:13 AM  LOS: 6 days   SUBJECTIVE:   Chief complaint: abd pain, nausea, bilious emesis    Diffuse abd pain continues, worse than her usual.  Yellow bilious emesis continues.  No BM's or flatus.   No improvement on this day 6 of admission. Pt tells me her PMD is planning endocrine and pain mgt referrals. Does not use SSI, just scheduled insulin at home.     OBJECTIVE:         Vital signs in last 24 hours:    Temp:  [98.2 F (36.8 C)-99.5 F (37.5 C)] 98.2 F (36.8 C) (08/23 0439) Pulse Rate:  [96-103] 98 (08/23 0439) Resp:  [18-20] 20 (08/23 0439) BP: (98-109)/(60-77) 100/60 (08/23 0439) SpO2:  [100 %] 100 % (08/23 0439) Weight:  [95.1 kg] 95.1 kg (08/23 0439) Last BM Date: 08/21/19 Filed Weights   08/26/19 0300 08/27/19 0526 08/29/19 0439  Weight: 95.2 kg 96.6 kg 95.1 kg   General: uncomfortable, alert.  Not acutely ill looking   Heart: RRR Chest: clear bil.   Abdomen: soft, diffusely tender w/o G/R, nondistended Extremities: no CCE Neuro/Psych:  Alert.  Oriented x 3.  No tremors or gross deficits.    Intake/Output from previous day: 08/22 0701 - 08/23 0700 In: 1020 [P.O.:820; IV Piggyback:200] Out: 151 [Urine:150; Emesis/NG output:1]  Intake/Output this shift: No intake/output data recorded.  Lab Results: No results for input(s): WBC, HGB, HCT, PLT in the last 72 hours. BMET Recent Labs    08/27/19 1031 08/28/19 0543 08/29/19 0547  NA 135 138 137  K 4.3 4.0 3.9  CL 101 104 103  CO2 27 25 25   GLUCOSE 131* 140* 77  BUN 16 11 16   CREATININE 0.57 0.72 0.78  CALCIUM 8.8* 9.2 9.0    Studies/Results: No results found.   Scheduled Meds: . atorvastatin  20 mg Oral q1800  . busPIRone  10 mg Oral TID  . enoxaparin (LOVENOX) injection  40 mg Subcutaneous Q24H  . insulin aspart  0-15 Units Subcutaneous TID AC & HS  . insulin aspart  10 Units Subcutaneous TID WC  .  insulin glargine  50 Units Subcutaneous Q24H  . lamoTRIgine  50 mg Oral TID  . metoCLOPramide (REGLAN) injection  10 mg Intravenous Q8H  . mirtazapine  7.5 mg Oral QHS  . pantoprazole  40 mg Oral BID  . polyethylene glycol  17 g Oral BID  . propranolol  20 mg Oral BID  . QUEtiapine  300 mg Oral QHS  . topiramate  25 mg Oral BID   Continuous Infusions: . dextrose 5 % and 0.45% NaCl 100 mL/hr at 08/29/19 1004  . erythromycin 300 mg (08/29/19 08/31/19)  . lactated ringers     PRN Meds:.dextrose, dicyclomine, hydrOXYzine, morphine injection, ondansetron (ZOFRAN) IV, oxyCODONE-acetaminophen, promethazine   ASSESMENT:   *  Acute on chronic N/V, abd pain in pt w uncontrolled IDDM.   Previous outpt oral Reglan, Zofran stopped in March as it was of no benefit.   10/2018 EGD: Mild Candida esophagitis, mild gastritis, some retained food without mechanical outlet obstruction.   03/2019 GES: Normal 4 hour study.   CTAPs 08/26/19, 02/2019, 12/2018,  10/2018 all unrevealing/no GI pathology.  Ultrasound abd 01/2019: Normal.   Scheduled IV Reglan, erythromycin, daily po PPI in place SMOG enema not helpful on 8/21 Wt 88.5 kg in mid 03/2019. 95.1 kg currently.   No pain meds in  use at home, including narcotics, NSAIDs, APAP.    *   Chronic constipation.  Report BM's just 2 x per month.   Amitiza, colace on hold.  Amitiza, Motegrity, stool softeners, Miralax not helpful in outpt setting.    Several outpt meds may be contributing (propranolol for anxiety and htn, Seroquel, Remeron,  Abilify).  However not likely she can get off these.    *   Uncontrolled IDDM, DKA.  Driving her GI sxs.  A1 c on 11.8 last month and running 13.4 - 12.9 previous 12 months.     PLAN   *   Per MD.  Diet as tolerated.   Adding scheduled Phenergan 12.5 q 6 hours x 10 doses.      Jennye Moccasin  08/29/2019, 11:13 AM Phone [phone]     Attending Physician Note   I have taken an interval history, reviewed the chart and  examined the patient. I agree with the Advanced Practitioner's note, impression and recommendations.   Nausea/vomiting persists. Chronic abdominal pain. Continue metoclopramide IV, pantoprazole PO, and erythromycin PO. Adding scheduled promethazine IV for 10 doses. Avoid or at least minimize narcotics. Advance diet as tolerated.  Uncontrolled DM driving her GI symptoms. Per primary service.   Constipation. Continue Miralax and stool softeners. Consider Linzess or Trulance when taking PO.   Claudette Head, MD [facility]

## 2019-08-29 NOTE — Progress Notes (Signed)
PROGRESS NOTE   Nancy [name]  OHY:[id number] DOB: 01/10/2000 DOA: 08/23/2019 PCP: Fleet Contras, MD   Brief Narrative: Nancy [name] is a 19 y.o. 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 Patient presented secondary to nausea/vomiting and admitted initially for DKA. No acidemia/acidosis noted but treated with insulin drip for elevated anion gap and beta-hydroxybutyric acid likely in setting of persistent vomiting.   Assessment & Plan:   Active Problems:   Nausea and vomiting   Generalized abdominal pain   Chronic abdominal pain   Seizure-like activity (HCC)   Hyperglycemia due to diabetes mellitus (HCC)   Type 1 diabetes mellitus with ketoacidosis without coma (HCC)   Diabetes mellitus, type 1 Hyperglycemia Patient did not meet criteria for DKA. Uncontrolled. Hemoglobin A1C of 11.8%. Patient takes Lantus 70 units daily and Novolog 15 units TID with meals. Patient presented with a glucose of 428, pH of 7.446, CO2 of 24, Beta-hydroxybutyric acid of 2.2 and anion gap of 17. Patient managed initially insulin drip until acidosis resolved and anion gap closed and was transitioned to Lantus 50 units daily to account for vomiting and hospital diet.  -Continue Lantus 50 units and Novolog SSI -Continue Novolog 10 units TID with meals if able to tolerate  Abdominal pain Chronic issue. No specific etiology but x-ray did show constipation. Unsure if this is the etiology but will manage with bowel movements. Repeat x-ray without evidence of obstruction/perforation. Patient is not passing gas with continued vomiting. CT scan unremarkable for etiology. SMOG enema did not produce a bowel movement -Percocet prn -Morphine IV prn since patient has significant emesis  Chronic constipation Patient has not had a bowel movement  while on colace/Miralax (complicated by vomiting), dulcolax suppository/SMOG enema -GI recommendations: plan to control nausea prior to managing constipation  Nausea/vomiting Continues to vomit. Patient has had a normal gastric emptying study from 03/2019 and multiple EGDs without etiology. She failed management with Reglan as an outpatient. Discussed the need to consider alternate forms of nutrition if patient continues to be unable to tolerate oral intake. 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 Appears to be secondary to patient's inability to take medication secondary to vomiting. -Continue Topmax  Adjustment disorder -Continue Buspar, Lamictal and Seroquel   DVT prophylaxis: Lovenox Code Status:   Code Status: Full Code Family Communication: None at bedside Disposition Plan: Discharge home in 2-3 days pending improvement of abdominal pain, improvement of nausea/vomiting, ability to consistently tolerate adequate caloric intake   Consultants:   Gastroenterology  Procedures:   None  Antimicrobials:  None    Subjective: Continues to vomit. No bowel movement. Continues abdominal pain.  Objective: Vitals:   08/28/19 1057 08/28/19 1304 08/28/19 1952 08/29/19 0439  BP: 105/69 98/64 109/77 100/60  Pulse: (!) 117 96 (!) 103 98  Resp: 16 18 18 20   Temp: 99 F (37.2 C) 98.7 F (37.1 C) 99.5 F (37.5 C) 98.2 F (36.8 C)  TempSrc: Oral Oral Oral Oral  SpO2: 100% 100% 100% 100%  Weight:    95.1 kg  Height:        Intake/Output Summary (Last 24 hours) at 08/29/2019 0719 Last data filed at 08/29/2019 0300 Gross per 24 hour  Intake 1020 ml  Output 151 ml  Net 869 ml  Filed Weights   08/26/19 0300 08/27/19 0526 08/29/19 0439  Weight: 95.2 kg 96.6 kg 95.1 kg    Examination:  General exam: Appears calm  and slightly uncomfortable Respiratory system: Clear to auscultation. Respiratory effort normal. Cardiovascular system: S1 & S2 heard, RRR. No murmurs, rubs, gallops or clicks. Gastrointestinal system: Abdomen is nondistended, soft and tender. No organomegaly or masses felt. Normal bowel sounds heard. Central nervous system: Alert and oriented. No focal neurological deficits. Musculoskeletal: No edema. No calf tenderness Skin: No cyanosis. No rashes Psychiatry: Judgement and insight appear normal. Mood & affect appropriate.         Data Reviewed: I have personally reviewed following labs and imaging studies  CBC Lab Results  Component Value Date   WBC 10.2 08/24/2019   RBC 4.85 08/24/2019   HGB 12.4 08/24/2019   HCT 39.5 08/24/2019   MCV 81.4 08/24/2019   MCH 25.6 (L) 08/24/2019   PLT 313 08/24/2019   MCHC 31.4 08/24/2019   RDW 15.7 (H) 08/24/2019   LYMPHSABS 4.0 08/08/2019   MONOABS 1.4 (H) 08/08/2019   EOSABS 0.1 08/08/2019   BASOSABS 0.0 08/08/2019     Last metabolic panel Lab Results  Component Value Date   NA 138 08/28/2019   K 4.0 08/28/2019   CL 104 08/28/2019   CO2 25 08/28/2019   BUN 11 08/28/2019   CREATININE 0.72 08/28/2019   GLUCOSE 140 (H) 08/28/2019   GFRNONAA >60 08/28/2019   GFRAA >60 08/28/2019   CALCIUM 9.2 08/28/2019   PHOS 3.6 12/19/2018   PROT 5.9 (L) 08/25/2019   ALBUMIN 3.3 (L) 08/25/2019   LABGLOB 2.5 10/05/2018   AGRATIO 1.9 10/05/2018   BILITOT 0.3 08/25/2019   ALKPHOS 73 08/25/2019   AST 17 08/25/2019   ALT 14 08/25/2019   ANIONGAP 9 08/28/2019    CBG (last 3)  Recent Labs    08/28/19 2012 08/28/19 2350 08/29/19 0616  GLUCAP 195* 299* 78     GFR: Estimated Creatinine Clearance: 122 mL/min (by C-G formula based on SCr of 0.72 mg/dL).  Coagulation Profile: Recent Labs  Lab 08/23/19 1020  INR 1.1    Recent Results (from the past 240 hour(s))  SARS Coronavirus 2 by RT PCR (hospital order, performed in [facility]  hospital lab) Nasopharyngeal Nasopharyngeal Swab     Status: None   Collection Time: 08/23/19 12:32 PM   Specimen: Nasopharyngeal Swab  Result Value Ref Range Status   SARS Coronavirus 2 NEGATIVE NEGATIVE Final    Comment: (NOTE) SARS-CoV-2 target nucleic acids are NOT DETECTED.  The SARS-CoV-2 RNA is generally detectable in upper and lower respiratory specimens during the acute phase of infection. The lowest concentration of SARS-CoV-2 viral copies this assay can detect is 250 copies / mL. A negative result does not preclude SARS-CoV-2 infection and should not be used as the sole basis for treatment or other patient management decisions.  A negative result may occur with improper specimen collection / handling, submission of specimen other than nasopharyngeal swab, presence of viral mutation(s) within the areas targeted by this assay, and inadequate number of viral copies (<250 copies / mL). A negative result must be combined with clinical observations, patient history, and epidemiological information.  Fact Sheet for Patients:   BoilerBrush.com.cy  Fact Sheet for Healthcare Providers: https://pope.com/  This test is not yet approved or  cleared by the [place] FDA and has been authorized for detection and/or diagnosis of SARS-CoV-2 by FDA under an Emergency Use Authorization (EUA).  This EUA will remain in effect (meaning this test can be used) for the duration of the COVID-19 declaration under Section 564(b)(1) of the Act, 21 U.S.C. section 360bbb-3(b)(1), unless the authorization is terminated or revoked sooner.  Performed at [facility], [address], [place], [place] [address]         Radiology Studies: No results found.      Scheduled Meds: . atorvastatin  20 mg Oral q1800  . busPIRone  10 mg Oral TID  . enoxaparin (LOVENOX) injection  40 mg Subcutaneous Q24H  . insulin aspart  0-15 Units  Subcutaneous TID AC & HS  . insulin aspart  10 Units Subcutaneous TID WC  . insulin glargine  50 Units Subcutaneous Q24H  . lamoTRIgine  50 mg Oral TID  . metoCLOPramide (REGLAN) injection  10 mg Intravenous Q8H  . mirtazapine  7.5 mg Oral QHS  . pantoprazole  40 mg Oral BID  . polyethylene glycol  17 g Oral BID  . propranolol  20 mg Oral BID  . QUEtiapine  300 mg Oral QHS  . topiramate  25 mg Oral BID   Continuous Infusions: . dextrose 5% lactated ringers Stopped (08/24/19 0032)  . erythromycin 300 mg (08/29/19 1324)  . lactated ringers       LOS: 6 days     Jacquelin Hawking, MD [facility] 08/29/2019, 7:19 AM  If 7PM-7AM, please contact night-coverage [email or web address]

## 2019-08-29 NOTE — Plan of Care (Signed)
  Problem: Clinical Measurements: Goal: Will remain free from infection Outcome: Completed/Met   Problem: Activity: Goal: Risk for activity intolerance will decrease Outcome: Completed/Met   Problem: Elimination: Goal: Will not experience complications related to urinary retention Outcome: Completed/Met   Problem: Safety: Goal: Ability to remain free from injury will improve Outcome: Completed/Met   Problem: Skin Integrity: Goal: Risk for impaired skin integrity will decrease Outcome: Completed/Met

## 2019-08-29 NOTE — Progress Notes (Signed)
Patient reported that she only had BM's 2x per month. This RN went to pts room, her rentire room  Smells like she just had BM   but when asked she said  She has not had any.   Received a call from service response that pt has been ordering meal tray 4-5 times and has been telling them that she doesn't like the food but when I asked her  she said she ate her dinner and saw her throwing the disposable tray in the trash. Will monitor accordingly.

## 2019-08-30 ENCOUNTER — Inpatient Hospital Stay (HOSPITAL_COMMUNITY): Payer: Medicaid Other

## 2019-08-30 DIAGNOSIS — E1010 Type 1 diabetes mellitus with ketoacidosis without coma: Principal | ICD-10-CM

## 2019-08-30 LAB — HEPATIC FUNCTION PANEL
ALT: 48 U/L — ABNORMAL HIGH (ref 0–44)
AST: 38 U/L (ref 15–41)
Albumin: 3.3 g/dL — ABNORMAL LOW (ref 3.5–5.0)
Alkaline Phosphatase: 87 U/L (ref 38–126)
Bilirubin, Direct: 0.1 mg/dL (ref 0.0–0.2)
Indirect Bilirubin: 0.4 mg/dL (ref 0.3–0.9)
Total Bilirubin: 0.5 mg/dL (ref 0.3–1.2)
Total Protein: 6.6 g/dL (ref 6.5–8.1)

## 2019-08-30 LAB — CBC
HCT: 37.2 % (ref 36.0–46.0)
Hemoglobin: 11.7 g/dL — ABNORMAL LOW (ref 12.0–15.0)
MCH: 25.1 pg — ABNORMAL LOW (ref 26.0–34.0)
MCHC: 31.5 g/dL (ref 30.0–36.0)
MCV: 79.8 fL — ABNORMAL LOW (ref 80.0–100.0)
Platelets: 238 10*3/uL (ref 150–400)
RBC: 4.66 MIL/uL (ref 3.87–5.11)
RDW: 15.8 % — ABNORMAL HIGH (ref 11.5–15.5)
WBC: 8.6 10*3/uL (ref 4.0–10.5)
nRBC: 0 % (ref 0.0–0.2)

## 2019-08-30 LAB — GLUCOSE, CAPILLARY
Glucose-Capillary: 284 mg/dL — ABNORMAL HIGH (ref 70–99)
Glucose-Capillary: 191 mg/dL — ABNORMAL HIGH (ref 70–99)
Glucose-Capillary: 184 mg/dL — ABNORMAL HIGH (ref 70–99)
Glucose-Capillary: 133 mg/dL — ABNORMAL HIGH (ref 70–99)
Glucose-Capillary: 126 mg/dL — ABNORMAL HIGH (ref 70–99)

## 2019-08-30 LAB — BASIC METABOLIC PANEL
Anion gap: 11 (ref 5–15)
BUN: 6 mg/dL (ref 6–20)
CO2: 20 mmol/L — ABNORMAL LOW (ref 22–32)
Calcium: 8.8 mg/dL — ABNORMAL LOW (ref 8.9–10.3)
Chloride: 101 mmol/L (ref 98–111)
Creatinine, Ser: 0.74 mg/dL (ref 0.44–1.00)
GFR calc Af Amer: >60 mL/min (ref >60)
GFR calc non Af Amer: >60 mL/min (ref >60)
Glucose, Bld: 192 mg/dL — ABNORMAL HIGH (ref 70–99)
Potassium: 3.8 mmol/L (ref 3.5–5.1)
Sodium: 132 mmol/L — ABNORMAL LOW (ref 135–145)

## 2019-08-30 LAB — MAGNESIUM: Magnesium: 1.8 mg/dL (ref 1.7–2.4)

## 2019-08-30 NOTE — Progress Notes (Addendum)
Daily Rounding Note  08/30/2019, 10:40 AM  LOS: 7 days   SUBJECTIVE:   Chief complaint: Acute on chronic abdominal pain, nausea, bilious emesis. Clear, foamy emesis persists.  Many episodes already today.  Abdominal pain persists.  Now ordered n.p.o. by attending MD. Still has not had any bowel movements and not passing much in the way of flatus Used 4 mg morphine, no oxycodone yesterday. IV morphine helps a little bit, unable to say whether the oxycodone is working because she vomits it frequently.  Describes her attempts to hold down 2 separate jobs, one at the [employer] at [employer], the other in the warehouse at [employer] but medical problems and hospitalizations have jeopardize her employment  OBJECTIVE:         Vital signs in last 24 hours:    Temp:  [97.3 F (36.3 C)-100.4 F (38 C)] 100.4 F (38 C) (08/24 0847) Pulse Rate:  [96-123] 105 (08/24 0847) Resp:  [16-20] 20 (08/24 0650) BP: (102-111)/(55-77) 105/61 (08/24 0847) SpO2:  [98 %-100 %] 100 % (08/24 0847) Weight:  [95.7 kg] 95.7 kg (08/24 0511) Last BM Date: 08/28/19 (per pt report) Filed Weights   08/27/19 0526 08/29/19 0439 08/30/19 0511  Weight: 96.6 kg 95.1 kg 95.7 kg   General: Pleasant, calm, currently comfortable.  Sitting at the side of the bed with emesis bag in her hand Heart: RRR Chest: Clear bilaterally without labored breathing Abdomen: Obese, soft, bowel sounds hypoactive.  Diffusely tender without guarding or rebound. Extremities: No CCE. Neuro/Psych: Alert.  Oriented fully.  No tremors.  Fluid speech.  Seems in better spirits today than yesterday  Intake/Output from previous day: 08/23 0701 - 08/24 0700 In: 2010.5 [P.O.:940; I.V.:623.2; IV Piggyback:447.3] Out: -   Intake/Output this shift: No intake/output data recorded.  Lab Results: No results for input(s): WBC, HGB, HCT, PLT in the last 72 hours. BMET Recent Labs     08/28/19 0543 08/29/19 0547 08/30/19 0622  NA 138 137 132*  K 4.0 3.9 3.8  CL 104 103 101  CO2 25 25 20*  GLUCOSE 140* 77 192*  BUN 11 16 6   CREATININE 0.72 0.78 0.74  CALCIUM 9.2 9.0 8.8*   Scheduled Meds: . atorvastatin  20 mg Oral q1800  . busPIRone  10 mg Oral TID  . enoxaparin (LOVENOX) injection  40 mg Subcutaneous Q24H  . insulin aspart  0-15 Units Subcutaneous TID AC & HS  . insulin aspart  10 Units Subcutaneous TID WC  . insulin glargine  50 Units Subcutaneous Q24H  . lamoTRIgine  50 mg Oral TID  . metoCLOPramide (REGLAN) injection  10 mg Intravenous Q8H  . mirtazapine  7.5 mg Oral QHS  . pantoprazole  40 mg Oral BID  . polyethylene glycol  17 g Oral BID  . promethazine  12.5 mg Intravenous Q6H  . propranolol  20 mg Oral BID  . QUEtiapine  300 mg Oral QHS  . topiramate  25 mg Oral BID   Continuous Infusions: . dextrose 5 % and 0.45% NaCl 100 mL/hr at 08/30/19 0014  . erythromycin 300 mg (08/30/19 0504)  . lactated ringers     PRN Meds:.dextrose, dicyclomine, hydrOXYzine, morphine injection, ondansetron (ZOFRAN) IV, oxyCODONE-acetaminophen, promethazine   ASSESMENT:   *  Acute on chronic N/V, abd pain in pt w uncontrolled IDDM.   Previous outpt oral Reglan, Zofran stopped in March as it was of no benefit.   10/2018 EGD: Mild  Candida esophagitis, mild gastritis, some retained food without mechanical outlet obstruction.   03/2019 GES: Normal 4 hour study.   CTAPs 08/26/19, 02/2019, 12/2018,  10/2018 all unrevealing/no GI pathology.  Ultrasound abd 01/2019: Normal.   Scheduled IV Reglan, erythromycin, daily po PPI in place SMOG enema not helpful on 8/21 Wt 88.5 kg in mid 03/2019. 95.1 kg currently.   No pain meds in use at home, including narcotics, NSAIDs, APAP.  Currently on scheduled IV Phenergan and IV Reglan, p.o. erythromycin if she is able to keep it down. Complicating current but not home picture is her use of oral and IV narcotics.    *   Chronic  constipation.  Report BM's just 2 x per month.   Amitiza, colace on hold.  Amitiza, Motegrity, stool softeners, Miralax not helpful in outpt setting.    Several outpt meds may be contributing (propranolol for anxiety and htn, Seroquel, Remeron,  Abilify).  However not likely she can get off these.    *   Uncontrolled IDDM, DKA.  Driving her GI sxs.  A1 c on 11.8 last month and running 13.4 - 12.9 previous 12 months.      PLAN   *   Trial of Linzess or Trulance when reliably taking p.o.  *   Is it possible to stop or decelerate dosing of narcotics?     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     Jennye Moccasin  08/30/2019, 10:40 AM   I have discussed the case with the PA. I personally interviewed and examined the patient.  She is laying comfortably in bed on her phone watching television when I enter the room. She says that within moments after eating and drinking anything she feels terribly nauseated and violently sick.  She has tenderness to light palpation of the entire abdominal wall.  Refractory generalized abdominal pain with nausea and vomiting of setting of poorly controlled diabetes.  2 days of around-the-clock metoclopramide and erythromycin have not been helpful.  As previously noted, this is exacerbated by ongoing use of opioids which should be avoided as much as possible.  I understand she has pain but the opioids are probably making this worse.  We have little else to offer for medicines at this point.  In my experience, if there is a significant element of your chronic anxiety or acute related to the severity of symptoms, sometimes Ativan can be helpful.  I would have to leave this to discretion of the hospitalist, especially given the patient's other psychiatric medications.  I agree I would not start any oral therapy for the constipation given her persistent  vomiting.  In the meantime, I am discontinuing erythromycin and dicyclomine because they have not been helpful.   25 minutes were spent on this encounter (including chart review, history/exam, counseling/coordination of care, and documentation)   Charlie Pitter III Office: [phone]  Phone [phone]

## 2019-08-30 NOTE — Progress Notes (Signed)
Pt. vomited 2x with light yellow color emesis with undigested food but mostly liquid. Pt complain of abdominal pin. Per pt she feels like she is hungry at the same time she is not.Pt is NPO at this time. MD made aware.

## 2019-08-30 NOTE — Progress Notes (Signed)
Pt states she had diarrhea this past Sunday (08/28/19), but usually only 1-2 BMs per month.

## 2019-08-30 NOTE — Progress Notes (Signed)
PROGRESS NOTE   Nancy [name]  XVQ:[id number] DOB: [birth year] DOA: 08/23/2019 PCP: Fleet Contras, MD   Brief Narrative: Nancy [name] is a 19 y.o. 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 Patient presented secondary to nausea/vomiting and admitted initially for DKA. No acidemia/acidosis noted but treated with insulin drip for elevated anion gap and beta-hydroxybutyric acid likely in setting of persistent vomiting.   Assessment & Plan:   Active Problems:   Nausea and vomiting   Generalized abdominal pain   Chronic abdominal pain   Seizure-like activity (HCC)   Hyperglycemia due to diabetes mellitus (HCC)   Type 1 diabetes mellitus with ketoacidosis without coma (HCC)   Diabetes mellitus, type 1 Hyperglycemia Patient did not meet criteria for DKA. Uncontrolled. Hemoglobin A1C of 11.8%. Patient takes Lantus 70 units daily and Novolog 15 units TID with meals. Patient presented with a glucose of 428, pH of 7.446, CO2 of 24, Beta-hydroxybutyric acid of 2.2 and anion gap of 17. Patient managed initially insulin drip until acidosis resolved and anion gap closed and was transitioned to Lantus 50 units daily to account for vomiting and hospital diet. CO2 trended down slightly. -Continue Lantus 50 units and Novolog SSI -Continue Novolog 10 units TID with meals if able to tolerate -Daily BMP while NPO -Continue D5 1/2 NS IV fluids  Abdominal pain Chronic issue. No specific etiology but x-ray did show constipation. Unsure if this is the etiology but will manage with bowel movements. Repeat x-ray without evidence of obstruction/perforation. Patient is not passing gas with continued vomiting. CT scan unremarkable for etiology. Abdominal pain appears to be improved. -Percocet prn -Morphine IV prn since patient has  significant emesis  Chronic constipation Patient has not had a bowel movement while on colace/Miralax (complicated by vomiting), dulcolax suppository/SMOG enema -GI recommendations: plan to control nausea prior to managing constipation  Nausea/vomiting Continues to vomit. Patient has had a normal gastric emptying study from 03/2019 and multiple EGDs without etiology. She failed management with Reglan as an outpatient. Discussed the need to consider alternate forms of nutrition if patient continues to be unable to tolerate oral intake. 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 Appears to be secondary to patient's inability to take medication secondary to vomiting. -Continue Topmax  Adjustment disorder -Continue Buspar, Lamictal and Seroquel   DVT prophylaxis: Lovenox Code Status:   Code Status: Full Code Family Communication: None at bedside Disposition Plan: Discharge home in 2-3 days pending improvement of abdominal pain, improvement of nausea/vomiting, ability to consistently tolerate adequate caloric intake   Consultants:   Gastroenterology  Procedures:   None  Antimicrobials:  None    Subjective: Patient states she is continuing to vomit large amounts. She reports no flatus or bowel movement. She states that she is frustrated that she seems to be getting worse. Nursing notes indicate patient appears to be eating more than previously thought. Patient is ordering multiple trays during the day.  Objective: Vitals:   08/30/19 0431 08/30/19 0502 08/30/19 0511 08/30/19 0650  BP: (!) 102/55 107/63  106/64  Pulse: (!) 122 (!) 123  (!) 123  Resp: 20 16  20   Temp: 99.9 F (37.7 C) 99.5 F (  37.5 C)  99.8 F (37.7  C)  TempSrc: Oral Oral  Oral  SpO2: 100% 98%  100%  Weight:   95.7 kg   Height:        Intake/Output Summary (Last 24 hours) at 08/30/2019 0817 Last data filed at 08/30/2019 0615 Gross per 24 hour  Intake 2010.53 ml  Output --  Net 2010.53 ml   Filed Weights   08/27/19 0526 08/29/19 0439 08/30/19 0511  Weight: 96.6 kg 95.1 kg 95.7 kg    Examination:  General exam: Appears calm and comfortable Respiratory system: Clear to auscultation. Respiratory effort normal. Cardiovascular system: S1 & S2 heard, RRR. No murmurs, rubs, gallops or clicks. Gastrointestinal system: Abdomen is nondistended, soft and nontender. No organomegaly or masses felt. Normal bowel sounds heard. Central nervous system: Alert and oriented. No focal neurological deficits. Musculoskeletal: No edema. No calf tenderness Skin: No cyanosis. No rashes Psychiatry: Judgement and insight appear normal. Mood & affect appropriate.          Data Reviewed: I have personally reviewed following labs and imaging studies  CBC Lab Results  Component Value Date   WBC 10.2 08/24/2019   RBC 4.85 08/24/2019   HGB 12.4 08/24/2019   HCT 39.5 08/24/2019   MCV 81.4 08/24/2019   MCH 25.6 (L) 08/24/2019   PLT 313 08/24/2019   MCHC 31.4 08/24/2019   RDW 15.7 (H) 08/24/2019   LYMPHSABS 4.0 08/08/2019   MONOABS 1.4 (H) 08/08/2019   EOSABS 0.1 08/08/2019   BASOSABS 0.0 08/08/2019     Last metabolic panel Lab Results  Component Value Date   NA 132 (L) 08/30/2019   K 3.8 08/30/2019   CL 101 08/30/2019   CO2 20 (L) 08/30/2019   BUN 6 08/30/2019   CREATININE 0.74 08/30/2019   GLUCOSE 192 (H) 08/30/2019   GFRNONAA >60 08/30/2019   GFRAA >60 08/30/2019   CALCIUM 8.8 (L) 08/30/2019   PHOS 3.6 12/19/2018   PROT 5.9 (L) 08/25/2019   ALBUMIN 3.3 (L) 08/25/2019   LABGLOB 2.5 10/05/2018   AGRATIO 1.9 10/05/2018   BILITOT 0.3 08/25/2019   ALKPHOS 73 08/25/2019   AST 17 08/25/2019   ALT 14 08/25/2019   ANIONGAP 11  08/30/2019    CBG (last 3)  Recent Labs    08/29/19 2129 08/30/19 0130 08/30/19 0621  GLUCAP 97 284* 191*     GFR: Estimated Creatinine Clearance: 122.4 mL/min (by C-G formula based on SCr of 0.74 mg/dL).  Coagulation Profile: Recent Labs  Lab 08/23/19 1020  INR 1.1    Recent Results (from the past 240 hour(s))  SARS Coronavirus 2 by RT PCR (hospital order, performed in [facility] hospital lab) Nasopharyngeal Nasopharyngeal Swab     Status: None   Collection Time: 08/23/19 12:32 PM   Specimen: Nasopharyngeal Swab  Result Value Ref Range Status   SARS Coronavirus 2 NEGATIVE NEGATIVE Final    Comment: (NOTE) SARS-CoV-2 target nucleic acids are NOT DETECTED.  The SARS-CoV-2 RNA is generally detectable in upper and lower respiratory specimens during the acute phase of infection. The lowest concentration of SARS-CoV-2 viral copies this assay can detect is 250 copies / mL. A negative result does not preclude SARS-CoV-2 infection and should not be used as the sole basis for treatment or other patient management decisions.  A negative result may occur with improper specimen collection / handling, submission of specimen other than nasopharyngeal swab, presence of viral mutation(s) within the areas targeted by this assay, and inadequate number of  viral copies (<250 copies / mL). A negative result must be combined with clinical observations, patient history, and epidemiological information.  Fact Sheet for Patients:   BoilerBrush.com.cy  Fact Sheet for Healthcare Providers: https://pope.com/  This test is not yet approved or  cleared by the [place] FDA and has been authorized for detection and/or diagnosis of SARS-CoV-2 by FDA under an Emergency Use Authorization (EUA).  This EUA will remain in effect (meaning this test can be used) for the duration of the COVID-19 declaration under Section 564(b)(1) of the Act, 21  U.S.C. section 360bbb-3(b)(1), unless the authorization is terminated or revoked sooner.  Performed at [facility], [address], [place], [place] [address]         Radiology Studies: No results found.      Scheduled Meds: . atorvastatin  20 mg Oral q1800  . busPIRone  10 mg Oral TID  . enoxaparin (LOVENOX) injection  40 mg Subcutaneous Q24H  . insulin aspart  0-15 Units Subcutaneous TID AC & HS  . insulin aspart  10 Units Subcutaneous TID WC  . insulin glargine  50 Units Subcutaneous Q24H  . lamoTRIgine  50 mg Oral TID  . metoCLOPramide (REGLAN) injection  10 mg Intravenous Q8H  . mirtazapine  7.5 mg Oral QHS  . pantoprazole  40 mg Oral BID  . polyethylene glycol  17 g Oral BID  . promethazine  12.5 mg Intravenous Q6H  . propranolol  20 mg Oral BID  . QUEtiapine  300 mg Oral QHS  . topiramate  25 mg Oral BID   Continuous Infusions: . dextrose 5 % and 0.45% NaCl 100 mL/hr at 08/30/19 0014  . erythromycin 300 mg (08/30/19 0504)  . lactated ringers       LOS: 7 days     Jacquelin Hawking, MD [facility] 08/30/2019, 8:17 AM  If 7PM-7AM, please contact night-coverage [email or web address]

## 2019-08-31 DIAGNOSIS — E1110 Type 2 diabetes mellitus with ketoacidosis without coma: Secondary | ICD-10-CM

## 2019-08-31 LAB — GLUCOSE, CAPILLARY
Glucose-Capillary: 151 mg/dL — ABNORMAL HIGH (ref 70–99)
Glucose-Capillary: 239 mg/dL — ABNORMAL HIGH (ref 70–99)
Glucose-Capillary: 199 mg/dL — ABNORMAL HIGH (ref 70–99)
Glucose-Capillary: 136 mg/dL — ABNORMAL HIGH (ref 70–99)

## 2019-08-31 LAB — BASIC METABOLIC PANEL
Anion gap: 10 (ref 5–15)
Anion gap: 10 (ref 5–15)
BUN: 5 mg/dL — ABNORMAL LOW (ref 6–20)
BUN: 6 mg/dL (ref 6–20)
CO2: 20 mmol/L — ABNORMAL LOW (ref 22–32)
CO2: 19 mmol/L — ABNORMAL LOW (ref 22–32)
Calcium: 9 mg/dL (ref 8.9–10.3)
Calcium: 8.7 mg/dL — ABNORMAL LOW (ref 8.9–10.3)
Chloride: 106 mmol/L (ref 98–111)
Chloride: 106 mmol/L (ref 98–111)
Creatinine, Ser: 0.67 mg/dL (ref 0.44–1.00)
Creatinine, Ser: 0.64 mg/dL (ref 0.44–1.00)
GFR calc Af Amer: >60 mL/min (ref >60)
GFR calc Af Amer: >60 mL/min (ref >60)
GFR calc non Af Amer: >60 mL/min (ref >60)
GFR calc non Af Amer: >60 mL/min (ref >60)
Glucose, Bld: 153 mg/dL — ABNORMAL HIGH (ref 70–99)
Glucose, Bld: 178 mg/dL — ABNORMAL HIGH (ref 70–99)
Potassium: 3.6 mmol/L (ref 3.5–5.1)
Potassium: 3.8 mmol/L (ref 3.5–5.1)
Sodium: 136 mmol/L (ref 135–145)
Sodium: 135 mmol/L (ref 135–145)

## 2019-08-31 LAB — CBC
HCT: 37.7 % (ref 36.0–46.0)
Hemoglobin: 11.8 g/dL — ABNORMAL LOW (ref 12.0–15.0)
MCH: 25.4 pg — ABNORMAL LOW (ref 26.0–34.0)
MCHC: 31.3 g/dL (ref 30.0–36.0)
MCV: 81.1 fL (ref 80.0–100.0)
Platelets: 269 10*3/uL (ref 150–400)
RBC: 4.65 MIL/uL (ref 3.87–5.11)
RDW: 15.9 % — ABNORMAL HIGH (ref 11.5–15.5)
WBC: 8.6 10*3/uL (ref 4.0–10.5)
nRBC: 0 % (ref 0.0–0.2)

## 2019-08-31 LAB — MAGNESIUM: Magnesium: 2.1 mg/dL (ref 1.7–2.4)

## 2019-08-31 MED ORDER — INSULIN ASPART 100 UNIT/ML ~~LOC~~ SOLN
0.0000 [IU] | Freq: Three times a day (TID) | SUBCUTANEOUS | Status: DC
  Administered 2019-08-31 – 2019-09-01 (×3): 5 [IU] via SUBCUTANEOUS
  Administered 2019-09-01: 11 [IU] via SUBCUTANEOUS
  Administered 2019-09-02: 3 [IU] via SUBCUTANEOUS
  Administered 2019-09-02: 5 [IU] via SUBCUTANEOUS
  Administered 2019-09-02: 3 [IU] via SUBCUTANEOUS
  Administered 2019-09-03 (×2): 5 [IU] via SUBCUTANEOUS
  Administered 2019-09-04: 4 [IU] via SUBCUTANEOUS

## 2019-08-31 MED ORDER — SORBITOL 70 % SOLN
30.0000 mL | Freq: Two times a day (BID) | Status: DC
  Administered 2019-08-31 – 2019-09-01 (×2): 30 mL via ORAL
  Filled 2019-08-31 (×2): qty 30

## 2019-08-31 MED ORDER — SORBITOL 70 % SOLN
960.0000 mL | TOPICAL_OIL | Freq: Two times a day (BID) | ORAL | Status: AC
  Administered 2019-08-31: 960 mL via RECTAL
  Filled 2019-08-31 (×2): qty 473

## 2019-08-31 MED ORDER — INSULIN GLARGINE 100 UNIT/ML ~~LOC~~ SOLN
25.0000 [IU] | Freq: Two times a day (BID) | SUBCUTANEOUS | Status: DC
  Administered 2019-08-31 (×2): 25 [IU] via SUBCUTANEOUS
  Filled 2019-08-31 (×5): qty 0.25

## 2019-08-31 MED ORDER — INSULIN ASPART 100 UNIT/ML ~~LOC~~ SOLN
0.0000 [IU] | Freq: Every day | SUBCUTANEOUS | Status: DC
  Administered 2019-09-01: 3 [IU] via SUBCUTANEOUS
  Administered 2019-09-03: 2 [IU] via SUBCUTANEOUS

## 2019-08-31 MED ORDER — ACETAMINOPHEN 325 MG PO TABS
650.0000 mg | ORAL_TABLET | Freq: Four times a day (QID) | ORAL | Status: DC | PRN
  Administered 2019-09-01: 650 mg via ORAL

## 2019-08-31 MED ORDER — INSULIN ASPART 100 UNIT/ML ~~LOC~~ SOLN
0.0000 [IU] | Freq: Three times a day (TID) | SUBCUTANEOUS | Status: DC
  Administered 2019-08-31: 5 [IU] via SUBCUTANEOUS

## 2019-08-31 MED ORDER — INSULIN ASPART 100 UNIT/ML ~~LOC~~ SOLN: 0.0000 [IU] | Freq: Every day | SUBCUTANEOUS | Status: DC

## 2019-08-31 MED ORDER — KETOROLAC TROMETHAMINE 30 MG/ML IJ SOLN
30.0000 mg | Freq: Four times a day (QID) | INTRAMUSCULAR | Status: DC | PRN
  Administered 2019-08-31 – 2019-09-03 (×8): 30 mg via INTRAVENOUS
  Filled 2019-08-31 (×9): qty 1

## 2019-08-31 MED ORDER — POLYETHYLENE GLYCOL 3350 17 G PO PACK
17.0000 g | PACK | Freq: Two times a day (BID) | ORAL | Status: AC
  Administered 2019-08-31 – 2019-09-01 (×4): 17 g via ORAL
  Filled 2019-08-31 (×4): qty 1

## 2019-08-31 NOTE — Progress Notes (Signed)
   08/31/19 0754  Assess: MEWS Score  Temp 99.4 F (37.4 C)  BP (!) 92/56  Pulse Rate (!) 101  Resp 15  Assess: MEWS Score  MEWS Temp 0  MEWS Systolic 1  MEWS Pulse 1  MEWS RR 0  MEWS LOC 0  MEWS Score 2  MEWS Score Color Yellow  Assess: if the MEWS score is Yellow or Red  Were vital signs taken at a resting state? Yes  Focused Assessment No change from prior assessment  Early Detection of Sepsis Score *See Row Information* Low  MEWS guidelines implemented *See Row Information* No, vital signs rechecked  Treat  Pain Scale 0-10  Pain Score 9  Pain Type Chronic pain  Pain Location Abdomen  Pain Orientation Right;Left  Pain Descriptors / Indicators  (Intractible pain? MD discontinued pain meds is aware of c/o.)  Multiple Pain Sites  (MD ordered Toradol non narcotics treat pain c/o's.)  Escalate  MEWS: Escalate Yellow: discuss with charge nurse/RN and consider discussing with provider and RRT  Notify: Charge Nurse/RN  Name of Charge Nurse/RN Notified Barbie,Rn  Date Charge Nurse/RN Notified 08/31/19  Time Charge Nurse/RN Notified 0800  Notify: Provider  Provider Name/Title  (Dr.Ortiz aware)  Date Provider Notified 08/31/19  Time Provider Notified 0800  Notification Type Rounds  Notification Reason Other (Comment)  Response  (no new orders noted patient stable.)  Date of Provider Response 08/31/19  Time of Provider Response 0800  Document  Patient Outcome Other (Comment)  Progress note created (see row info)  (patient stable no interventions this time.)

## 2019-08-31 NOTE — Plan of Care (Signed)
  Problem: Education: Goal: Knowledge of General Education information will improve Description: Including pain rating scale, medication(s)/side effects and non-pharmacologic comfort measures Outcome: Progressing   Problem: Health Behavior/Discharge Planning: Goal: Ability to manage health-related needs will improve Outcome: Progressing   Problem: Clinical Measurements: Goal: Ability to maintain clinical measurements within normal limits will improve Outcome: Progressing Goal: Diagnostic test results will improve Outcome: Progressing Goal: Respiratory complications will improve Outcome: Progressing Goal: Cardiovascular complication will be avoided Outcome: Progressing   Problem: Nutrition: Goal: Adequate nutrition will be maintained Outcome: Progressing   Problem: Coping: Goal: Level of anxiety will decrease Outcome: Progressing   Problem: Elimination: Goal: Will not experience complications related to bowel motility Outcome: Progressing   Problem: Pain Managment: Goal: General experience of comfort will improve Outcome: Progressing   Problem: Education: Goal: Ability to demonstrate management of disease process will improve Outcome: Progressing Goal: Ability to verbalize understanding of medication therapies will improve Outcome: Progressing Goal: Individualized Educational Video(s) Outcome: Progressing   Problem: Activity: Goal: Capacity to carry out activities will improve Outcome: Progressing   Problem: Cardiac: Goal: Ability to achieve and maintain adequate cardiopulmonary perfusion will improve Outcome: Progressing

## 2019-08-31 NOTE — Progress Notes (Addendum)
[facility] PROGRESS NOTE    Progress Note  Nancy [name]  YBW:[id number] DOB: November 20, 2000 DOA: 08/23/2019 PCP: Fleet Contras, MD     Brief Narrative:   Nancy [name] is an 19 y.o. 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  Assessment/Plan:   Hyperglycemia due to noncompliance of insulin type 1 diabetic: Started on IV insulin on admission once her acidosis corrected, she was transitioned to long-acting insulin plus sliding scale. Blood glucose fairly controlled, she is eating about 20 to 50% of her meals. Continue current regimen.  Abdominal pain/intractable nausea vomiting: A chronic issue abdominal x-ray showed large amount of stool burden she received a smog but she did not have a bowel movement. CT scan of the abdomen pelvis was unremarkable. Currently on narcotics which can make her emesis significantly worse.  I will go ahead and DC all of her narcotics use Tylenol for pain. She had a normal gastric emptying study in March 2021 with multiple EGDs that did not show an etiology.  She has failed Reglan. GI was consulted who recommended IV Reglan and erythromycin.  Chronic constipation: Due to her nausea and vomiting will limit oral medication for constipation we will give her a smog x2.  Pseudoseizures: None continue current medication.  Adjustment disorder: Continue BuSpar Lamictal and Seroquel.   DVT prophylaxis: lovenox Family Communication:none Status is: Inpatient  Remains inpatient appropriate because:Hemodynamically unstable   Dispo: The patient is from: Home              Anticipated d/c is to: Home              Anticipated d/c date is: 2 days              Patient currently is not medically stable to d/c.        Code Status:     Code Status Orders  (From admission, onward)         Start      Ordered   08/23/19 1258  Full code  Continuous        08/23/19 1259        Code Status History    Date Active Date Inactive Code Status Order ID Comments User Context   08/06/2019 1505 08/09/2019 1923 Full Code [id number]  Lanae Boast, MD ED   02/26/2019 2248 02/28/2019 1839 Full Code [id number]  Briscoe Deutscher, MD ED   01/27/2019 0430 01/29/2019 2313 Full Code [id number]  Eduard Clos, MD ED   12/19/2018 1439 12/23/2018 2134 Full Code [id number]  Ollen Bowl, MD ED   11/30/2018 2357 12/02/2018 1706 Full Code [id number]  Pearson Grippe, MD ED   11/28/2018 0138 11/30/2018 1802 Full Code [id number]  Briscoe Deutscher, MD ED   11/08/2018 0646 11/11/2018 1530 Full Code [id number]  Jackelyn Poling, NP Inpatient   11/06/2018 1640 11/07/2018 1747 Full Code [id number]  Maryagnes Amos, FNP Inpatient   10/08/2018 1535 10/15/2018 1500 Full Code [id number]  Clydie Braun, MD ED   10/01/2018 1833 10/02/2018 2059 Full Code [id number]  Milagros Loll, MD ED   09/15/2018 1816 09/17/2018 1729 Full Code [id number]  Money, Gerlene Burdock, FNP Inpatient   09/13/2018 0228 09/15/2018 1813 Full Code [id number]  Hillary Bow, DO ED   08/06/2018 1303 08/08/2018 1732 Full Code [id number]  Lahoma Crocker, MD ED  08/03/2018 2023 08/05/2018 1721 Full Code [id number]  Pieter Partridge, MD Inpatient   07/25/2018 0540 07/26/2018 1613 Full Code [id number]  Briscoe Deutscher, MD ED   06/14/2018 2159 06/15/2018 1944 Full Code [id number]  Rometta Emery, MD Inpatient   01/30/2018 0346 02/01/2018 1609 Full Code [id number]  Eduard Clos, MD ED   01/24/2018 1736 01/26/2018 1624 Full Code [id number]  Myrtie Neither, MD ED   12/24/2017 2356 12/26/2017 1612 Full Code [id number]  Briscoe Deutscher, MD ED   12/19/2017 1039 12/20/2017 1437 Full Code [id number]  Myrtie Neither, MD ED   12/16/2017 0358 12/16/2017 1335 Full Code [id number]  Hillary Bow, DO ED   12/14/2017 1517 12/15/2017 1944 Full Code [id number]  Rhetta Mura, MD ED    11/24/2017 0219 11/25/2017 1849 Full Code [id number]  Eduard Clos, MD Inpatient   Advance Care Planning Activity        IV Access:    Peripheral IV   Procedures and diagnostic studies:   DG Abd 2 Views  Result Date: 08/30/2019 CLINICAL DATA:  Persistent nausea, vomiting, abdominal pain EXAM: ABDOMEN - 2 VIEW COMPARISON:  08/24/2019 FINDINGS: Large stool burden throughout the colon. There is normal bowel gas pattern. No free air. No organomegaly or suspicious calcification. No acute bony abnormality. IMPRESSION: Large stool burden.  No acute findings. Electronically Signed   By: Charlett Nose M.D.   On: 08/30/2019 17:21     Medical Consultants:    None.  Anti-Infectives:   none  Subjective:    Nancy [name] she relates she feels worse she has not tolerated anything, continues to have abdominal pain intractable nausea vomiting.  Objective:    Vitals:   08/30/19 2016 08/31/19 0441 08/31/19 0442 08/31/19 0754  BP: 109/74 (!) 109/58  (!) 92/56  Pulse: (!) 116 (!) 105  (!) 101  Resp: 16 18  15   Temp: 100.1 F (37.8 C) 99.4 F (37.4 C)  99.4 F (37.4 C)  TempSrc: Oral Oral  Oral  SpO2: 100% 98%    Weight:   94.2 kg   Height:       SpO2: 98 % O2 Flow Rate (L/min): 0 L/min   Intake/Output Summary (Last 24 hours) at 08/31/2019 0830 Last data filed at 08/31/2019 0500 Gross per 24 hour  Intake 0 ml  Output 1350 ml  Net -1350 ml   Filed Weights   08/29/19 0439 08/30/19 0511 08/31/19 0442  Weight: 95.1 kg 95.7 kg 94.2 kg    Exam: General exam: In no acute distress. Respiratory system: Good air movement and clear to auscultation. Cardiovascular system: S1 & S2 heard, RRR. No JVD.  Gastrointestinal system: Abdomen is nondistended, soft and nontender.  Extremities: No pedal edema. Skin: No rashes, lesions or ulcers Psychiatry: Judgement and insight appear normal. Mood & affect appropriate.    Data Reviewed:    Labs: Basic Metabolic  Panel: Recent Labs  Lab 08/27/19 1031 08/27/19 1031 08/28/19 0543 08/28/19 0543 08/29/19 0547 08/29/19 0547 08/30/19 0622 08/31/19 0531  NA 135  --  138  --  137  --  132* 136  K 4.3   < > 4.0   < > 3.9   < > 3.8 3.6  CL 101  --  104  --  103  --  101 106  CO2 27  --  25  --  25  --  20* 20*  GLUCOSE 131*  --  140*  --  77  --  192* 153*  BUN 16  --  11  --  16  --  6 5*  CREATININE 0.57  --  0.72  --  0.78  --  0.74 0.67  CALCIUM 8.8*  --  9.2  --  9.0  --  8.8* 9.0  MG 2.2  --  2.1  --  2.1  --  1.8 2.1   < > = values in this interval not displayed.   GFR Estimated Creatinine Clearance: 121.3 mL/min (by C-G formula based on SCr of 0.67 mg/dL). Liver Function Tests: Recent Labs  Lab 08/25/19 1029 08/30/19 0646  AST 17 38  ALT 14 48*  ALKPHOS 73 87  BILITOT 0.3 0.5  PROT 5.9* 6.6  ALBUMIN 3.3* 3.3*   No results for input(s): LIPASE, AMYLASE in the last 168 hours. No results for input(s): AMMONIA in the last 168 hours. Coagulation profile No results for input(s): INR, PROTIME in the last 168 hours. COVID-19 Labs  No results for input(s): DDIMER, FERRITIN, LDH, CRP in the last 72 hours.  Lab Results  Component Value Date   SARSCOV2NAA NEGATIVE 08/23/2019   SARSCOV2NAA NEGATIVE 08/06/2019   SARSCOV2NAA NEGATIVE 02/26/2019   SARSCOV2NAA NEGATIVE 01/27/2019    CBC: Recent Labs  Lab 08/24/19 0830 08/30/19 1529 08/31/19 0531  WBC 10.2 8.6 8.6  HGB 12.4 11.7* 11.8*  HCT 39.5 37.2 37.7  MCV 81.4 79.8* 81.1  PLT 313 238 269   Cardiac Enzymes: No results for input(s): CKTOTAL, CKMB, CKMBINDEX, TROPONINI in the last 168 hours. BNP (last 3 results) No results for input(s): PROBNP in the last 8760 hours. CBG: Recent Labs  Lab 08/30/19 0621 08/30/19 1145 08/30/19 1624 08/30/19 2138 08/31/19 0612  GLUCAP 191* 184* 133* 126* 151*   D-Dimer: No results for input(s): DDIMER in the last 72 hours. Hgb A1c: No results for input(s): HGBA1C in the last 72  hours. Lipid Profile: No results for input(s): CHOL, HDL, LDLCALC, TRIG, CHOLHDL, LDLDIRECT in the last 72 hours. Thyroid function studies: No results for input(s): TSH, T4TOTAL, T3FREE, THYROIDAB in the last 72 hours.  Invalid input(s): FREET3 Anemia work up: No results for input(s): VITAMINB12, FOLATE, FERRITIN, TIBC, IRON, RETICCTPCT in the last 72 hours. Sepsis Labs: Recent Labs  Lab 08/24/19 0830 08/30/19 1529 08/31/19 0531  WBC 10.2 8.6 8.6   Microbiology Recent Results (from the past 240 hour(s))  SARS Coronavirus 2 by RT PCR (hospital order, performed in [facility] hospital lab) Nasopharyngeal Nasopharyngeal Swab     Status: None   Collection Time: 08/23/19 12:32 PM   Specimen: Nasopharyngeal Swab  Result Value Ref Range Status   SARS Coronavirus 2 NEGATIVE NEGATIVE Final    Comment: (NOTE) SARS-CoV-2 target nucleic acids are NOT DETECTED.  The SARS-CoV-2 RNA is generally detectable in upper and lower respiratory specimens during the acute phase of infection. The lowest concentration of SARS-CoV-2 viral copies this assay can detect is 250 copies / mL. A negative result does not preclude SARS-CoV-2 infection and should not be used as the sole basis for treatment or other patient management decisions.  A negative result may occur with improper specimen collection / handling, submission of specimen other than nasopharyngeal swab, presence of viral mutation(s) within the areas targeted by this assay, and inadequate number of viral copies (<250 copies / mL). A negative result must be combined with clinical observations, patient history, and epidemiological information.  Fact Sheet for Patients:   BoilerBrush.com.cy  Fact Sheet for Healthcare Providers: https://pope.com/  This  test is not yet approved or  cleared by the [place] and has been authorized for detection and/or diagnosis of SARS-CoV-2 by FDA under an  Emergency Use Authorization (EUA).  This EUA will remain in effect (meaning this test can be used) for the duration of the COVID-19 declaration under Section 564(b)(1) of the Act, 21 U.S.C. section 360bbb-3(b)(1), unless the authorization is terminated or revoked sooner.  Performed at [facility], [address], [place], [place] [address]      Medications:   . atorvastatin  20 mg Oral q1800  . busPIRone  10 mg Oral TID  . enoxaparin (LOVENOX) injection  40 mg Subcutaneous Q24H  . insulin aspart  0-15 Units Subcutaneous TID AC & HS  . insulin aspart  10 Units Subcutaneous TID WC  . insulin glargine  50 Units Subcutaneous Q24H  . lamoTRIgine  50 mg Oral TID  . metoCLOPramide (REGLAN) injection  10 mg Intravenous Q8H  . mirtazapine  7.5 mg Oral QHS  . pantoprazole  40 mg Oral BID  . polyethylene glycol  17 g Oral BID  . promethazine  12.5 mg Intravenous Q6H  . propranolol  20 mg Oral BID  . QUEtiapine  300 mg Oral QHS  . topiramate  25 mg Oral BID   Continuous Infusions: . dextrose 5 % and 0.45% NaCl 100 mL/hr at 08/30/19 2130  . lactated ringers        LOS: 8 days   Marinda Elk  [facility]  08/31/2019, 8:30 AM

## 2019-08-31 NOTE — Progress Notes (Signed)
Writer agrees with students assessment this am.

## 2019-08-31 NOTE — Progress Notes (Addendum)
Daily Rounding Note  08/31/2019, 8:39 AM  LOS: 8 days   SUBJECTIVE:   Chief complaint:    Intractable nausea and vomiting  Used 8 mg Morphine yesterday, 2 mg thus far today.  This was d/c'd at 0830 this AM.   Nausea, non-bloody emesis continues along w abd pain.  No stools or flatus Advised pt I was ordering clear liquids, she asked if she could have full liquids in stead.    ROS: Denies chest pain, dyspnea or dysuria  OBJECTIVE:         Vital signs in last 24 hours:    Temp:  [99.4 F (37.4 C)-100.4 F (38 C)] 99.4 F (37.4 C) (08/25 0754) Pulse Rate:  [101-116] 101 (08/25 0754) Resp:  [15-19] 15 (08/25 0754) BP: (92-111)/(56-78) 92/56 (08/25 0754) SpO2:  [98 %-100 %] 98 % (08/25 0441) Weight:  [94.2 kg] 94.2 kg (08/25 0442) Last BM Date:  (Pt states bowel movements are irregular and not often) Filed Weights   08/29/19 0439 08/30/19 0511 08/31/19 0442  Weight: 95.1 kg 95.7 kg 94.2 kg   General: looks well, comfortable   Heart: RRR Chest: clear bil.  No dyspnea Abdomen: soft, ND, active BS.  Mild general tenderness.    Extremities: no CCE.   Neuro/Psych:  Pleasant, cooperative.  No   Intake/Output from previous day: 08/24 0701 - 08/25 0700 In: 0  Out: 1350 [Emesis/NG output:1350]  Intake/Output this shift: No intake/output data recorded.  Lab Results: Recent Labs    08/30/19 1529 08/31/19 0531  WBC 8.6 8.6  HGB 11.7* 11.8*  HCT 37.2 37.7  PLT 238 269   BMET Recent Labs    08/29/19 0547 08/30/19 0622 08/31/19 0531  NA 137 132* 136  K 3.9 3.8 3.6  CL 103 101 106  CO2 25 20* 20*  GLUCOSE 77 192* 153*  BUN 16 6 5*  CREATININE 0.78 0.74 0.67  CALCIUM 9.0 8.8* 9.0   LFT Recent Labs    08/30/19 0646  PROT 6.6  ALBUMIN 3.3*  AST 38  ALT 48*  ALKPHOS 87  BILITOT 0.5  BILIDIR 0.1  IBILI 0.4   PT/INR No results for input(s): LABPROT, INR in the last 72 hours. Hepatitis Panel No  results for input(s): HEPBSAG, HCVAB, HEPAIGM, HEPBIGM in the last 72 hours.  Studies/Results: DG Abd 2 Views  Result Date: 08/30/2019 CLINICAL DATA:  Persistent nausea, vomiting, abdominal pain EXAM: ABDOMEN - 2 VIEW COMPARISON:  08/24/2019 FINDINGS: Large stool burden throughout the colon. There is normal bowel gas pattern. No free air. No organomegaly or suspicious calcification. No acute bony abnormality. IMPRESSION: Large stool burden.  No acute findings. Electronically Signed   By: Charlett Nose M.D.   On: 08/30/2019 17:21   Scheduled Meds: . atorvastatin  20 mg Oral q1800  . busPIRone  10 mg Oral TID  . enoxaparin (LOVENOX) injection  40 mg Subcutaneous Q24H  . insulin aspart  0-15 Units Subcutaneous TID WC  . insulin aspart  0-5 Units Subcutaneous QHS  . insulin aspart  10 Units Subcutaneous TID WC  . insulin glargine  25 Units Subcutaneous BID  . lamoTRIgine  50 mg Oral TID  . metoCLOPramide (REGLAN) injection  10 mg Intravenous Q8H  . mirtazapine  7.5 mg Oral QHS  . pantoprazole  40 mg Oral BID  . polyethylene glycol  17 g Oral BID  . promethazine  12.5 mg Intravenous Q6H  . propranolol  20 mg Oral BID  . QUEtiapine  300 mg Oral QHS  . sorbitol, milk of mag, mineral oil, glycerin (SMOG) enema  960 mL Rectal BID  . topiramate  25 mg Oral BID   Continuous Infusions: . dextrose 5 % and 0.45% NaCl 100 mL/hr at 08/30/19 2130  . lactated ringers     PRN Meds:.acetaminophen, dextrose, hydrOXYzine, ketorolac, ondansetron (ZOFRAN) IV, promethazine   ASSESMENT:   *   Refractory n/v, generalized abd pain.  Failed treatment w multiple meds. Scheduled Reglan and Phenergan in place  *   Poorly controlled IDDM.    PLAN   *   ? Trial of Ativan?  Defer decision to hospitalist  *   Full diet as tolerated.     *   Minimize use of narcotics.  Note Morphine dc'd this AM.      Jennye Moccasin  08/31/2019, 8:39 AM Phone [phone]  I have discussed the case with the PA, and  that is the plan I formulated. I personally interviewed and examined the patient.  Feels about the same today, with diffuse abdominal pain and constant nausea.  She was able to keep down some clear liquids earlier and would like to try a few bites of a sandwich.  I recommended great caution with that but I am agreeable, since she seems invested in the treatment plan.  Opioids were stopped, and hoping for some symptom improvement after that.  Other medication suggestions were made in yesterday's progress note.  We will follow.  Total time 25 minutes  Charlie Pitter III Office: [phone]

## 2019-09-01 LAB — GLUCOSE, CAPILLARY
Glucose-Capillary: 241 mg/dL — ABNORMAL HIGH (ref 70–99)
Glucose-Capillary: 321 mg/dL — ABNORMAL HIGH (ref 70–99)
Glucose-Capillary: 217 mg/dL — ABNORMAL HIGH (ref 70–99)
Glucose-Capillary: 289 mg/dL — ABNORMAL HIGH (ref 70–99)

## 2019-09-01 LAB — BASIC METABOLIC PANEL
Anion gap: 14 (ref 5–15)
BUN: 6 mg/dL (ref 6–20)
CO2: 17 mmol/L — ABNORMAL LOW (ref 22–32)
Calcium: 8.8 mg/dL — ABNORMAL LOW (ref 8.9–10.3)
Chloride: 104 mmol/L (ref 98–111)
Creatinine, Ser: 0.73 mg/dL (ref 0.44–1.00)
GFR calc Af Amer: >60 mL/min (ref >60)
GFR calc non Af Amer: >60 mL/min (ref >60)
Glucose, Bld: 272 mg/dL — ABNORMAL HIGH (ref 70–99)
Potassium: 3.8 mmol/L (ref 3.5–5.1)
Sodium: 135 mmol/L (ref 135–145)

## 2019-09-01 LAB — MAGNESIUM: Magnesium: 1.9 mg/dL (ref 1.7–2.4)

## 2019-09-01 MED ORDER — BISACODYL 10 MG RE SUPP
10.0000 mg | Freq: Four times a day (QID) | RECTAL | Status: AC
  Administered 2019-09-01 (×2): 10 mg via RECTAL
  Filled 2019-09-01 (×2): qty 1

## 2019-09-01 MED ORDER — SENNOSIDES-DOCUSATE SODIUM 8.6-50 MG PO TABS
3.0000 | ORAL_TABLET | Freq: Two times a day (BID) | ORAL | Status: DC
  Administered 2019-09-01 – 2019-09-03 (×6): 3 via ORAL
  Filled 2019-09-01 (×6): qty 3

## 2019-09-01 MED ORDER — SORBITOL 70 % SOLN: 30.0000 mL | Freq: Once | Status: DC

## 2019-09-01 MED ORDER — INSULIN GLARGINE 100 UNIT/ML ~~LOC~~ SOLN
30.0000 [IU] | Freq: Two times a day (BID) | SUBCUTANEOUS | Status: DC
  Administered 2019-09-01 – 2019-09-03 (×6): 30 [IU] via SUBCUTANEOUS
  Filled 2019-09-01 (×8): qty 0.3

## 2019-09-01 NOTE — Progress Notes (Signed)
[facility] PROGRESS NOTE    Progress Note  Nancy [name]  [facility]:[id number] DOB: 2000/12/07 DOA: 08/23/2019 PCP: Nancy Contras, MD     Brief Narrative:   Nancy [name] is an 19 y.o. 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  Assessment/Plan:   Hyperglycemia due to noncompliance of insulin type 1 diabetic: Started on IV insulin on admission once her acidosis corrected, she was transitioned to long-acting insulin plus sliding scale. She is tolerating her diet, will blood glucose trending up we will increase her long-acting continue sliding scale.  Abdominal pain/intractable nausea vomiting: A chronic issue abdominal x-ray showed large amount of stool burden she received a smog but she did not have a bowel movement. CT scan of the abdomen pelvis was unremarkable. I will go ahead and continue sorbitol MiraLAX she has not had a bowel movement.   No further nausea or vomiting, since narcotics were stopped. GI was consulted who recommended IV Reglan and erythromycin. Avoid benzodiazepines and narcotics.  Chronic constipation: Did not tolerate the smog, she would like to continue to try oral MiraLAX and sorbitol.  Pseudoseizures: None continue current medication.  Adjustment disorder: Continue BuSpar Lamictal and Seroquel.   DVT prophylaxis: lovenox Family Communication:none Status is: Inpatient  Remains inpatient appropriate because:Hemodynamically unstable   Dispo: The patient is from: Home              Anticipated d/c is to: Home              Anticipated d/c date is: 2 days              Patient currently is medically stable to d/c.        Code Status:     Code Status Orders  (From admission, onward)         Start     Ordered   08/23/19 1258  Full code  Continuous        08/23/19 1259        Code  Status History    Date Active Date Inactive Code Status Order ID Comments User Context   08/06/2019 1505 08/09/2019 1923 Full Code [id number]  Lanae Boast, MD ED   02/26/2019 2248 02/28/2019 1839 Full Code [id number]  Briscoe Deutscher, MD ED   01/27/2019 0430 01/29/2019 2313 Full Code [id number]  Eduard Clos, MD ED   12/19/2018 1439 12/23/2018 2134 Full Code [id number]  Ollen Bowl, MD ED   11/30/2018 2357 12/02/2018 1706 Full Code [id number]  Pearson Grippe, MD ED   11/28/2018 0138 11/30/2018 1802 Full Code [id number]  Briscoe Deutscher, MD ED   11/08/2018 0646 11/11/2018 1530 Full Code [id number]  Jackelyn Poling, NP Inpatient   11/06/2018 1640 11/07/2018 1747 Full Code [id number]  Maryagnes Amos, FNP Inpatient   10/08/2018 1535 10/15/2018 1500 Full Code [id number]  Clydie Braun, MD ED   10/01/2018 1833 10/02/2018 2059 Full Code [id number]  Milagros Loll, MD ED   09/15/2018 1816 09/17/2018 1729 Full Code [id number]  Money, Gerlene Burdock, FNP Inpatient   09/13/2018 0228 09/15/2018 1813 Full Code [id number]  Hillary Bow, DO ED   08/06/2018 1303 08/08/2018 1732 Full Code [id number]  Lahoma Crocker, MD ED   08/03/2018 2023 08/05/2018 1721 Full Code [id number]  Pieter Partridge, MD Inpatient   07/25/2018 0540 07/26/2018 1613 Full  Code [id number]  Briscoe Deutscher, MD ED   06/14/2018 2159 06/15/2018 1944 Full Code [id number]  Rometta Emery, MD Inpatient   01/30/2018 0346 02/01/2018 1609 Full Code [id number]  Eduard Clos, MD ED   01/24/2018 1736 01/26/2018 1624 Full Code [id number]  Myrtie Neither, MD ED   12/24/2017 2356 12/26/2017 1612 Full Code [id number]  Briscoe Deutscher, MD ED   12/19/2017 1039 12/20/2017 1437 Full Code [id number]  Myrtie Neither, MD ED   12/16/2017 0358 12/16/2017 1335 Full Code [id number]  Hillary Bow, DO ED   12/14/2017 1517 12/15/2017 1944 Full Code [id number]  Rhetta Mura, MD ED   11/24/2017 0219 11/25/2017 1849 Full Code [id number]  Eduard Clos, MD  Inpatient   Advance Care Planning Activity        IV Access:    Peripheral IV   Procedures and diagnostic studies:   DG Abd 2 Views  Result Date: 08/30/2019 CLINICAL DATA:  Persistent nausea, vomiting, abdominal pain EXAM: ABDOMEN - 2 VIEW COMPARISON:  08/24/2019 FINDINGS: Large stool burden throughout the colon. There is normal bowel gas pattern. No free air. No organomegaly or suspicious calcification. No acute bony abnormality. IMPRESSION: Large stool burden.  No acute findings. Electronically Signed   By: Charlett Nose M.D.   On: 08/30/2019 17:21     Medical Consultants:    None.  Anti-Infectives:   none  Subjective:    Nancy [name] no further nausea tolerating her diet still with very mild abdominal pain.  Objective:    Vitals:   08/31/19 0757 08/31/19 1118 08/31/19 2056 09/01/19 0449  BP: 106/62 107/68 123/67 107/72  Pulse: 100 (!) 108 100 84  Resp: 20 14 16 15   Temp: 99.1 F (37.3 C) 99.7 F (37.6 C) 99.7 F (37.6 C) 98 F (36.7 C)  TempSrc:  Oral Oral Oral  SpO2:  100% 100% 98%  Weight:    95.5 kg  Height:       SpO2: 98 % O2 Flow Rate (L/min): 0 L/min   Intake/Output Summary (Last 24 hours) at 09/01/2019 0812 Last data filed at 09/01/2019 0505 Gross per 24 hour  Intake 240 ml  Output 1100 ml  Net -860 ml   Filed Weights   08/30/19 0511 08/31/19 0442 09/01/19 0449  Weight: 95.7 kg 94.2 kg 95.5 kg    Exam: General exam: In no acute distress. Respiratory system: Good air movement and clear to auscultation. Cardiovascular system: S1 & S2 heard, RRR. No JVD. Gastrointestinal system: Abdomen is nondistended, soft and nontender.  Extremities: No pedal edema. Skin: No rashes, lesions or ulcers Psychiatry: Judgement and insight appear normal. Mood & affect appropriate.   Data Reviewed:    Labs: Basic Metabolic Panel: Recent Labs  Lab 08/28/19 0543 08/28/19 0543 08/29/19 2202 08/29/19 5427 08/30/19 0622 08/30/19 0622  08/31/19 0531 08/31/19 0531 08/31/19 1018 09/01/19 0647  NA 138   < > 137  --  132*  --  136  --  135 135  K 4.0   < > 3.9   < > 3.8   < > 3.6   < > 3.8 3.8  CL 104   < > 103  --  101  --  106  --  106 104  CO2 25   < > 25  --  20*  --  20*  --  19* 17*  GLUCOSE 140*   < > 77  --  192*  --  153*  --  178* 272*  BUN 11   < > 16  --  6  --  5*  --  6 6  CREATININE 0.72   < > 0.78  --  0.74  --  0.67  --  0.64 0.73  CALCIUM 9.2   < > 9.0  --  8.8*  --  9.0  --  8.7* 8.8*  MG 2.1  --  2.1  --  1.8  --  2.1  --   --  1.9   < > = values in this interval not displayed.   GFR Estimated Creatinine Clearance: 122.2 mL/min (by C-G formula based on SCr of 0.73 mg/dL). Liver Function Tests: Recent Labs  Lab 08/25/19 1029 08/30/19 0646  AST 17 38  ALT 14 48*  ALKPHOS 73 87  BILITOT 0.3 0.5  PROT 5.9* 6.6  ALBUMIN 3.3* 3.3*   No results for input(s): LIPASE, AMYLASE in the last 168 hours. No results for input(s): AMMONIA in the last 168 hours. Coagulation profile No results for input(s): INR, PROTIME in the last 168 hours. COVID-19 Labs  No results for input(s): DDIMER, FERRITIN, LDH, CRP in the last 72 hours.  Lab Results  Component Value Date   SARSCOV2NAA NEGATIVE 08/23/2019   SARSCOV2NAA NEGATIVE 08/06/2019   SARSCOV2NAA NEGATIVE 02/26/2019   SARSCOV2NAA NEGATIVE 01/27/2019    CBC: Recent Labs  Lab 08/30/19 1529 08/31/19 0531  WBC 8.6 8.6  HGB 11.7* 11.8*  HCT 37.2 37.7  MCV 79.8* 81.1  PLT 238 269   Cardiac Enzymes: No results for input(s): CKTOTAL, CKMB, CKMBINDEX, TROPONINI in the last 168 hours. BNP (last 3 results) No results for input(s): PROBNP in the last 8760 hours. CBG: Recent Labs  Lab 08/31/19 0612 08/31/19 1115 08/31/19 1657 08/31/19 2058 09/01/19 0624  GLUCAP 151* 239* 199* 136* 241*   D-Dimer: No results for input(s): DDIMER in the last 72 hours. Hgb A1c: No results for input(s): HGBA1C in the last 72 hours. Lipid Profile: No results for  input(s): CHOL, HDL, LDLCALC, TRIG, CHOLHDL, LDLDIRECT in the last 72 hours. Thyroid function studies: No results for input(s): TSH, T4TOTAL, T3FREE, THYROIDAB in the last 72 hours.  Invalid input(s): FREET3 Anemia work up: No results for input(s): VITAMINB12, FOLATE, FERRITIN, TIBC, IRON, RETICCTPCT in the last 72 hours. Sepsis Labs: Recent Labs  Lab 08/30/19 1529 08/31/19 0531  WBC 8.6 8.6   Microbiology Recent Results (from the past 240 hour(s))  SARS Coronavirus 2 by RT PCR (hospital order, performed in [facility] hospital lab) Nasopharyngeal Nasopharyngeal Swab     Status: None   Collection Time: 08/23/19 12:32 PM   Specimen: Nasopharyngeal Swab  Result Value Ref Range Status   SARS Coronavirus 2 NEGATIVE NEGATIVE Final    Comment: (NOTE) SARS-CoV-2 target nucleic acids are NOT DETECTED.  The SARS-CoV-2 RNA is generally detectable in upper and lower respiratory specimens during the acute phase of infection. The lowest concentration of SARS-CoV-2 viral copies this assay can detect is 250 copies / mL. A negative result does not preclude SARS-CoV-2 infection and should not be used as the sole basis for treatment or other patient management decisions.  A negative result may occur with improper specimen collection / handling, submission of specimen other than nasopharyngeal swab, presence of viral mutation(s) within the areas targeted by this assay, and inadequate number of viral copies (<250 copies / mL). A negative result must be combined with clinical observations, patient history, and epidemiological information.  Fact Sheet for Patients:  BoilerBrush.com.cy  Fact Sheet for Healthcare Providers: https://pope.com/  This test is not yet approved or  cleared by the [place] FDA and has been authorized for detection and/or diagnosis of SARS-CoV-2 by FDA under an Emergency Use Authorization (EUA).  This EUA will  remain in effect (meaning this test can be used) for the duration of the COVID-19 declaration under Section 564(b)(1) of the Act, 21 U.S.C. section 360bbb-3(b)(1), unless the authorization is terminated or revoked sooner.  Performed at [facility], [address], [place], [place] [address]      Medications:   . atorvastatin  20 mg Oral q1800  . busPIRone  10 mg Oral TID  . enoxaparin (LOVENOX) injection  40 mg Subcutaneous Q24H  . insulin aspart  0-15 Units Subcutaneous TID WC  . insulin aspart  0-5 Units Subcutaneous QHS  . insulin aspart  10 Units Subcutaneous TID WC  . insulin glargine  30 Units Subcutaneous BID  . lamoTRIgine  50 mg Oral TID  . metoCLOPramide (REGLAN) injection  10 mg Intravenous Q8H  . mirtazapine  7.5 mg Oral QHS  . pantoprazole  40 mg Oral BID  . polyethylene glycol  17 g Oral BID  . propranolol  20 mg Oral BID  . QUEtiapine  300 mg Oral QHS  . sorbitol  30 mL Oral BID  . sorbitol, milk of mag, mineral oil, glycerin (SMOG) enema  960 mL Rectal BID  . topiramate  25 mg Oral BID   Continuous Infusions:     LOS: 9 days   Marinda Elk  [facility]  09/01/2019, 8:12 AM

## 2019-09-01 NOTE — Progress Notes (Addendum)
Daily Rounding Note  09/01/2019, 11:29 AM  LOS: 9 days   SUBJECTIVE:   Chief complaint: n/v, abd pain, constipation    No BM's, attempt at [facility] enema yesterday aborted when staff unable to insert tubing beyond a short distance and encountered stool impeding advancement of tubing, stool seen at tip of tubing.   Still vomiting and having abd pain.  Able to keep grits but not sandwich, eggs or juice down.    OBJECTIVE:         Vital signs in last 24 hours:    Temp:  [98 F (36.7 C)-99.7 F (37.6 C)] 98 F (36.7 C) (08/26 0449) Pulse Rate:  [82-100] 82 (08/26 1002) Resp:  [15-16] 15 (08/26 0449) BP: (107-123)/(67-74) 111/74 (08/26 1002) SpO2:  [98 %-100 %] 98 % (08/26 0449) Weight:  [95.5 kg] 95.5 kg (08/26 0449) Last BM Date:  (Pt states bowel movements are irregular and not often) Filed Weights   08/30/19 0511 08/31/19 0442 09/01/19 0449  Weight: 95.7 kg 94.2 kg 95.5 kg   General:  Looks a bit ill but in no  distress   Heart: RRR Chest: clear Abdomen: obese, soft, BS hypoactive.  Tender w/o guarding  Extremities: no CCE.   Neuro/Psych:  Cooperative, fluid speech.  Fully alert and oriented.   Pt did not want me to perform DRE, so I did not.    Intake/Output from previous day: 08/25 0701 - 08/26 0700 In: 240 [P.O.:240] Out: 1100 [Urine:700; Emesis/NG output:400]  Intake/Output this shift: Total I/O In: 240 [P.O.:240] Out: 200 [Urine:200]  Lab Results: Recent Labs    08/30/19 1529 08/31/19 0531  WBC 8.6 8.6  HGB 11.7* 11.8*  HCT 37.2 37.7  PLT 238 269   BMET Recent Labs    08/31/19 0531 08/31/19 1018 09/01/19 0647  NA 136 135 135  K 3.6 3.8 3.8  CL 106 106 104  CO2 20* 19* 17*  GLUCOSE 153* 178* 272*  BUN 5* 6 6  CREATININE 0.67 0.64 0.73  CALCIUM 9.0 8.7* 8.8*   LFT Recent Labs    08/30/19 0646  PROT 6.6  ALBUMIN 3.3*  AST 38  ALT 48*  ALKPHOS 87  BILITOT 0.5  BILIDIR 0.1  IBILI  0.4   PT/INR No results for input(s): LABPROT, INR in the last 72 hours. Hepatitis Panel No results for input(s): HEPBSAG, HCVAB, HEPAIGM, HEPBIGM in the last 72 hours.  Studies/Results: DG Abd 2 Views  Result Date: 08/30/2019 CLINICAL DATA:  Persistent nausea, vomiting, abdominal pain EXAM: ABDOMEN - 2 VIEW COMPARISON:  08/24/2019 FINDINGS: Large stool burden throughout the colon. There is normal bowel gas pattern. No free air. No organomegaly or suspicious calcification. No acute bony abnormality. IMPRESSION: Large stool burden.  No acute findings. Electronically Signed   By: Charlett Nose M.D.   On: 08/30/2019 17:21    ASSESMENT:   *   Acute on chronic N/V, abdominal pain Refractory sxs, failed mgt with multiple meds.   Constipation, severe.   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  Dx ~ age 17. A1c 11.8 3 to 4 weeks ago.   Glucose 272 today.  On Lantus and novolog w additional SS novolog prn.     PLAN   *  Trail of senokot, 3 po bid.  Stopping Sorbital as it is not working and tends to add to bloat.  *   Pt is on  quite a few psych meds that pt describes as being RXd for pain mgt rather than hx depression.  Wonder if a psychiatry consult would help to d/c unnecessary meds that may be contributing to dysmotility??       Jennye Moccasin  09/01/2019, 11:29 AM Phone [phone]  I have discussed the case with the PA, and that is the plan I formulated. I personally interviewed and examined the patient.  Unfortunately, we are just not making progress with this patient's symptoms.  It is difficult to tell how much his diabetic gastropathy versus obstipation, chronic functional abdominal pain and medication side effect.  In addition to the above, she needs a Dulcolax suppository.  If she does not have significant catharsis from that and the oral Senokot, then my advice is a nasoduodenal tube to administer PEG solution for complete bowel  cleanout and then also possibly for tube feeds if necessary.   25 minutes were spent on this encounter (including chart review, history/exam, counseling/coordination of care, and documentation)  Charlie Pitter III Office: [phone]

## 2019-09-02 DIAGNOSIS — K5909 Other constipation: Secondary | ICD-10-CM

## 2019-09-02 LAB — BASIC METABOLIC PANEL
Anion gap: 9 (ref 5–15)
BUN: 15 mg/dL (ref 6–20)
CO2: 21 mmol/L — ABNORMAL LOW (ref 22–32)
Calcium: 9.1 mg/dL (ref 8.9–10.3)
Chloride: 108 mmol/L (ref 98–111)
Creatinine, Ser: 0.72 mg/dL (ref 0.44–1.00)
GFR calc Af Amer: >60 mL/min (ref >60)
GFR calc non Af Amer: >60 mL/min (ref >60)
Glucose, Bld: 192 mg/dL — ABNORMAL HIGH (ref 70–99)
Potassium: 4.1 mmol/L (ref 3.5–5.1)
Sodium: 138 mmol/L (ref 135–145)

## 2019-09-02 LAB — GLUCOSE, CAPILLARY
Glucose-Capillary: 195 mg/dL — ABNORMAL HIGH (ref 70–99)
Glucose-Capillary: 208 mg/dL — ABNORMAL HIGH (ref 70–99)
Glucose-Capillary: 168 mg/dL — ABNORMAL HIGH (ref 70–99)
Glucose-Capillary: 131 mg/dL — ABNORMAL HIGH (ref 70–99)

## 2019-09-02 LAB — MAGNESIUM: Magnesium: 2 mg/dL (ref 1.7–2.4)

## 2019-09-02 MED ORDER — ARIPIPRAZOLE 5 MG PO TABS
10.0000 mg | ORAL_TABLET | Freq: Two times a day (BID) | ORAL | Status: DC
  Administered 2019-09-02 – 2019-09-03 (×4): 10 mg via ORAL
  Filled 2019-09-02 (×4): qty 2

## 2019-09-02 MED ORDER — PEG 3350-KCL-NA BICARB-NACL 420 G PO SOLR
4000.0000 mL | Freq: Once | ORAL | Status: AC
  Administered 2019-09-02: 4000 mL via ORAL
  Filled 2019-09-02: qty 4000

## 2019-09-02 MED ORDER — SORBITOL 70 % SOLN
960.0000 mL | TOPICAL_OIL | Freq: Once | ORAL | Status: AC
  Administered 2019-09-02: 960 mL via RECTAL
  Filled 2019-09-02: qty 473

## 2019-09-02 NOTE — Progress Notes (Signed)
[facility] PROGRESS NOTE    Progress Note  SOLI ORAVETZ  QIH:[id number] DOB: 14-Jan-2000 DOA: 08/23/2019 PCP: Fleet Contras, MD     Brief Narrative:   Nancy [name] is an 19 y.o. 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  Assessment/Plan:   Hyperglycemia due to noncompliance of insulin type 1 diabetic: Started on IV insulin on admission once her acidosis corrected, she was transitioned to long-acting insulin plus sliding scale. She is tolerating her diet, will blood glucose trending up we will increase her long-acting continue sliding scale.  Abdominal pain/intractable nausea vomiting: A chronic issue abdominal x-ray showed large amount of stool burden she received a smog but she did not have a bowel movement.  CT scan of the abdomen pelvis showed no acute findings. Despite getting senna, docusate MiraLAX and sorbitol she has not had a bowel movement.  Per GI recommendations we will send an NG tube and start her on GoLYTELY. Electrolytes are stable her bicarb is 21 creatinine at baseline potassium is 4.1.  Chronic constipation: See above for further details.  Pseudoseizures: None continue current medication.  Adjustment disorder: Continue BuSpar Lamictal and Seroquel.   DVT prophylaxis: lovenox Family Communication:none Status is: Inpatient  Remains inpatient appropriate because:Hemodynamically unstable   Dispo: The patient is from: Home              Anticipated d/c is to: Home              Anticipated d/c date is: 2 days              Patient currently is medically stable to d/c.      Code Status:     Code Status Orders  (From admission, onward)         Start     Ordered   08/23/19 1258  Full code  Continuous        08/23/19 1259        Code Status History    Date Active Date Inactive Code  Status Order ID Comments User Context   08/06/2019 1505 08/09/2019 1923 Full Code [id number]  Lanae Boast, MD ED   02/26/2019 2248 02/28/2019 1839 Full Code [id number]  Briscoe Deutscher, MD ED   01/27/2019 0430 01/29/2019 2313 Full Code [id number]  Eduard Clos, MD ED   12/19/2018 1439 12/23/2018 2134 Full Code [id number]  Ollen Bowl, MD ED   11/30/2018 2357 12/02/2018 1706 Full Code [id number]  Pearson Grippe, MD ED   11/28/2018 0138 11/30/2018 1802 Full Code [id number]  Briscoe Deutscher, MD ED   11/08/2018 0646 11/11/2018 1530 Full Code [id number]  Jackelyn Poling, NP Inpatient   11/06/2018 1640 11/07/2018 1747 Full Code [id number]  Maryagnes Amos, FNP Inpatient   10/08/2018 1535 10/15/2018 1500 Full Code [id number]  Clydie Braun, MD ED   10/01/2018 1833 10/02/2018 2059 Full Code [id number]  Milagros Loll, MD ED   09/15/2018 1816 09/17/2018 1729 Full Code [id number]  Money, Gerlene Burdock, FNP Inpatient   09/13/2018 0228 09/15/2018 1813 Full Code [id number]  Hillary Bow, DO ED   08/06/2018 1303 08/08/2018 1732 Full Code [id number]  Lahoma Crocker, MD ED   08/03/2018 2023 08/05/2018 1721 Full Code [id number]  Pieter Partridge, MD Inpatient   07/25/2018 0540 07/26/2018 1613 Full Code [id number]  Opyd, Lavone Neri, MD  ED   06/14/2018 2159 06/15/2018 1944 Full Code [id number]  Rometta Emery, MD Inpatient   01/30/2018 0346 02/01/2018 1609 Full Code [id number]  Eduard Clos, MD ED   01/24/2018 1736 01/26/2018 1624 Full Code [id number]  Myrtie Neither, MD ED   12/24/2017 2356 12/26/2017 1612 Full Code [id number]  Briscoe Deutscher, MD ED   12/19/2017 1039 12/20/2017 1437 Full Code [id number]  Myrtie Neither, MD ED   12/16/2017 0358 12/16/2017 1335 Full Code [id number]  Hillary Bow, DO ED   12/14/2017 1517 12/15/2017 1944 Full Code [id number]  Rhetta Mura, MD ED   11/24/2017 0219 11/25/2017 1849 Full Code [id number]  Eduard Clos, MD Inpatient   Advance Care Planning Activity         IV Access:    Peripheral IV   Procedures and diagnostic studies:   No results found.   Medical Consultants:    None.  Anti-Infectives:   none  Subjective:    [name] she continues to have mild abdominal pain only tolerating liquid diet.  Objective:    Vitals:   09/01/19 1035 09/01/19 1316 09/01/19 2009 09/02/19 0412  BP: 98/60 104/61 125/86 111/70  Pulse:  95 (!) 106 94  Resp:  18 16 18   Temp:  98.9 F (37.2 C) 98.9 F (37.2 C) 97.8 F (36.6 C)  TempSrc:  Oral Oral Oral  SpO2:  100% 100% 100%  Weight:    97.2 kg  Height:       SpO2: 100 % O2 Flow Rate (L/min): 0 L/min   Intake/Output Summary (Last 24 hours) at 09/02/2019 0815 Last data filed at 09/01/2019 2010 Gross per 24 hour  Intake 720 ml  Output 900 ml  Net -180 ml   Filed Weights   08/31/19 0442 09/01/19 0449 09/02/19 0412  Weight: 94.2 kg 95.5 kg 97.2 kg    Exam: General exam: In no acute distress. Respiratory system: Good air movement and clear to auscultation. Cardiovascular system: S1 & S2 heard, RRR. No JVD. Gastrointestinal system: Abdomen is nondistended, soft and nontender.  Extremities: No pedal edema. Skin: No rashes, lesions or ulcers Psychiatry: Judgement and insight appear normal. Mood & affect appropriate.   Data Reviewed:    Labs: Basic Metabolic Panel: Recent Labs  Lab 08/29/19 0547 08/29/19 0547 08/30/19 0622 08/30/19 0622 08/31/19 0531 08/31/19 0531 08/31/19 1018 08/31/19 1018 09/01/19 0647 09/02/19 0513  NA 137   < > 132*  --  136  --  135  --  135 138  K 3.9   < > 3.8   < > 3.6   < > 3.8   < > 3.8 4.1  CL 103   < > 101  --  106  --  106  --  104 108  CO2 25   < > 20*  --  20*  --  19*  --  17* 21*  GLUCOSE 77   < > 192*  --  153*  --  178*  --  272* 192*  BUN 16   < > 6  --  5*  --  6  --  6 15  CREATININE 0.78   < > 0.74  --  0.67  --  0.64  --  0.73 0.72  CALCIUM 9.0   < > 8.8*  --  9.0  --  8.7*  --  8.8* 9.1  MG 2.1  --  1.8  --   2.1  --   --   --  1.9 2.0   < > = values in this interval not displayed.   GFR Estimated Creatinine Clearance: 123.5 mL/min (by C-G formula based on SCr of 0.72 mg/dL). Liver Function Tests: Recent Labs  Lab 08/30/19 0646  AST 38  ALT 48*  ALKPHOS 87  BILITOT 0.5  PROT 6.6  ALBUMIN 3.3*   No results for input(s): LIPASE, AMYLASE in the last 168 hours. No results for input(s): AMMONIA in the last 168 hours. Coagulation profile No results for input(s): INR, PROTIME in the last 168 hours. COVID-19 Labs  No results for input(s): DDIMER, FERRITIN, LDH, CRP in the last 72 hours.  Lab Results  Component Value Date   SARSCOV2NAA NEGATIVE 08/23/2019   SARSCOV2NAA NEGATIVE 08/06/2019   SARSCOV2NAA NEGATIVE 02/26/2019   SARSCOV2NAA NEGATIVE 01/27/2019    CBC: Recent Labs  Lab 08/30/19 1529 08/31/19 0531  WBC 8.6 8.6  HGB 11.7* 11.8*  HCT 37.2 37.7  MCV 79.8* 81.1  PLT 238 269   Cardiac Enzymes: No results for input(s): CKTOTAL, CKMB, CKMBINDEX, TROPONINI in the last 168 hours. BNP (last 3 results) No results for input(s): PROBNP in the last 8760 hours. CBG: Recent Labs  Lab 09/01/19 0624 09/01/19 1148 09/01/19 1621 09/01/19 2135 09/02/19 0645  GLUCAP 241* 321* 217* 289* 195*   D-Dimer: No results for input(s): DDIMER in the last 72 hours. Hgb A1c: No results for input(s): HGBA1C in the last 72 hours. Lipid Profile: No results for input(s): CHOL, HDL, LDLCALC, TRIG, CHOLHDL, LDLDIRECT in the last 72 hours. Thyroid function studies: No results for input(s): TSH, T4TOTAL, T3FREE, THYROIDAB in the last 72 hours.  Invalid input(s): FREET3 Anemia work up: No results for input(s): VITAMINB12, FOLATE, FERRITIN, TIBC, IRON, RETICCTPCT in the last 72 hours. Sepsis Labs: Recent Labs  Lab 08/30/19 1529 08/31/19 0531  WBC 8.6 8.6   Microbiology Recent Results (from the past 240 hour(s))  SARS Coronavirus 2 by RT PCR (hospital order, performed in [facility]  hospital lab) Nasopharyngeal Nasopharyngeal Swab     Status: None   Collection Time: 08/23/19 12:32 PM   Specimen: Nasopharyngeal Swab  Result Value Ref Range Status   SARS Coronavirus 2 NEGATIVE NEGATIVE Final    Comment: (NOTE) SARS-CoV-2 target nucleic acids are NOT DETECTED.  The SARS-CoV-2 RNA is generally detectable in upper and lower respiratory specimens during the acute phase of infection. The lowest concentration of SARS-CoV-2 viral copies this assay can detect is 250 copies / mL. A negative result does not preclude SARS-CoV-2 infection and should not be used as the sole basis for treatment or other patient management decisions.  A negative result may occur with improper specimen collection / handling, submission of specimen other than nasopharyngeal swab, presence of viral mutation(s) within the areas targeted by this assay, and inadequate number of viral copies (<250 copies / mL). A negative result must be combined with clinical observations, patient history, and epidemiological information.  Fact Sheet for Patients:   BoilerBrush.com.cy  Fact Sheet for Healthcare Providers: https://pope.com/  This test is not yet approved or  cleared by the [place] FDA and has been authorized for detection and/or diagnosis of SARS-CoV-2 by FDA under an Emergency Use Authorization (EUA).  This EUA will remain in effect (meaning this test can be used) for the duration of the COVID-19 declaration under Section 564(b)(1) of the Act, 21 U.S.C. section 360bbb-3(b)(1), unless the authorization is terminated or revoked sooner.  Performed at [facility], [address] [place], [place]  [address]      Medications:   . atorvastatin  20 mg Oral q1800  . busPIRone  10 mg Oral TID  . enoxaparin (LOVENOX) injection  40 mg Subcutaneous Q24H  . insulin aspart  0-15 Units Subcutaneous TID WC  . insulin aspart  0-5 Units Subcutaneous  QHS  . insulin aspart  10 Units Subcutaneous TID WC  . insulin glargine  30 Units Subcutaneous BID  . lamoTRIgine  50 mg Oral TID  . metoCLOPramide (REGLAN) injection  10 mg Intravenous Q8H  . mirtazapine  7.5 mg Oral QHS  . pantoprazole  40 mg Oral BID  . polyethylene glycol-electrolytes  4,000 mL Oral Once  . propranolol  20 mg Oral BID  . QUEtiapine  300 mg Oral QHS  . senna-docusate  3 tablet Oral BID  . topiramate  25 mg Oral BID   Continuous Infusions:     LOS: 10 days   Marinda Elk  [facility]  09/02/2019, 8:15 AM

## 2019-09-02 NOTE — Progress Notes (Addendum)
          Daily Rounding Note  09/02/2019, 9:22 AM  LOS: 10 days   SUBJECTIVE:   Chief complaint:  Chronic N/V, abd pain.   Still no BM's after multiple meds yest.  Vomiting continues, not as bad.  Tolerating intermittent clears.  Urinating well.    Dr Robb Matar has ordered NGT placed and Golytely administration.   Pt relays dizziness and standing hypotension with walking yesterday.    OBJECTIVE:         Vital signs in last 24 hours:    Temp:  [97.8 F (36.6 C)-98.9 F (37.2 C)] 97.8 F (36.6 C) (08/27 0412) Pulse Rate:  [82-106] 94 (08/27 0412) Resp:  [16-18] 18 (08/27 0412) BP: (98-125)/(60-86) 111/70 (08/27 0412) SpO2:  [100 %] 100 % (08/27 0412) Weight:  [97.2 kg] 97.2 kg (08/27 0412) Last BM Date:  (Pt states bowel movements are irregular and not often) Filed Weights   08/31/19 0442 09/01/19 0449 09/02/19 0412  Weight: 94.2 kg 95.5 kg 97.2 kg   General: pleasant, comfortable, alert.  Does not appear acutely ill   Heart: RRR Chest: clear bil.  No labored breathing Abdomen: soft, mild to mod tenderness diffusely, BS hypoactive.  Extremities: no CCE Neuro/Psych:  Pleasant, cooperative, fluid speech.    Intake/Output from previous day: 08/26 0701 - 08/27 0700 In: 720 [P.O.:720] Out: 900 [Urine:600; Emesis/NG output:300]  Intake/Output this shift: No intake/output data recorded.  Lab Results: Recent Labs    08/30/19 1529 08/31/19 0531  WBC 8.6 8.6  HGB 11.7* 11.8*  HCT 37.2 37.7  PLT 238 269   BMET Recent Labs    08/31/19 1018 09/01/19 0647 09/02/19 0513  NA 135 135 138  K 3.8 3.8 4.1  CL 106 104 108  CO2 19* 17* 21*  GLUCOSE 178* 272* 192*  BUN 6 6 15   CREATININE 0.64 0.73 0.72  CALCIUM 8.7* 8.8* 9.1   ROS: Denies chest pain dyspnea or dysuria  ASSESMENT:   *   Acute on chronic N/V, abdominal pain Refractory sxs, failed mgt with multiple meds.   Constipation, severe.   Whole picture fits with  non-obstructive diabetes related GI dysmotility   *   Poorly controlled IDDM.  Dx ~ age 2. A1c 11.8 3 to 4 weeks ago.   Glucose 192 this AM.  On Lantus and novolog w additional SS novolog prn.     *   Depression, anxiety.  Has not been getting her Abilify which shes taken for ~ 3 years.  12 is her [facility] provider.      PLAN   *   Golytely via NGT.    *   outpt referral to GI motility specialist at [facility]   *   D/c Lyrica.  Restart Abilify 10 mg bid.       [facility]  09/02/2019, 9:22 AM Phone [phone]  I have discussed the case with the PA, and that is the plan I formulated. I personally interviewed and examined the patient.  Multifactorial symptoms as previously noted.  We will try to get her severe constipation relieved.  If she does not tolerate NG tube, then please arrange nasoduodenal tube placement.  Total time 25 minutes   [phone] III Office: [phone]

## 2019-09-03 ENCOUNTER — Inpatient Hospital Stay (HOSPITAL_COMMUNITY): Payer: Medicaid Other

## 2019-09-03 LAB — MAGNESIUM: Magnesium: 2 mg/dL (ref 1.7–2.4)

## 2019-09-03 LAB — GLUCOSE, CAPILLARY
Glucose-Capillary: 217 mg/dL — ABNORMAL HIGH (ref 70–99)
Glucose-Capillary: 99 mg/dL (ref 70–99)
Glucose-Capillary: 223 mg/dL — ABNORMAL HIGH (ref 70–99)
Glucose-Capillary: 209 mg/dL — ABNORMAL HIGH (ref 70–99)

## 2019-09-03 MED ORDER — LIDOCAINE HCL URETHRAL/MUCOSAL 2 % EX GEL
1.0000 "application " | Freq: Once | CUTANEOUS | Status: AC
  Administered 2019-09-03: 1 via INTRATRACHEAL
  Filled 2019-09-03: qty 30

## 2019-09-03 MED ORDER — SODIUM CHLORIDE 0.9 % IV SOLN: INTRAVENOUS | Status: DC

## 2019-09-03 MED ORDER — SORBITOL 70 % SOLN
960.0000 mL | TOPICAL_OIL | Freq: Once | ORAL | Status: DC
  Filled 2019-09-03 (×2): qty 473

## 2019-09-03 MED ORDER — PEG 3350-KCL-NA BICARB-NACL 420 G PO SOLR
4000.0000 mL | Freq: Once | ORAL | Status: AC
  Administered 2019-09-03: 4000 mL via ORAL
  Filled 2019-09-03 (×2): qty 4000

## 2019-09-03 NOTE — Progress Notes (Signed)
Patient with large loose stool in the bathroom. NG tube is still in left nares but has come largely dislodged past the original insertion length. Patient unable to tolerate adjustment of the NG tube and was then removed the rest of the length. Patient with bloody nose since insertion but it is not a large amount. Patient has ~500 ccs of second jug of golytely. Patient agrees to take the remainder by mouth.

## 2019-09-03 NOTE — Progress Notes (Signed)
[facility] PROGRESS NOTE    Progress Note  Nancy [name]  PFX:[id number] DOB: 2000/03/31 DOA: 08/23/2019 PCP: Fleet Contras, MD     Brief Narrative:   Nancy [name] is an 19 y.o. 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  Assessment/Plan:   Hyperglycemia due to noncompliance of insulin type 1 diabetic: Started on IV insulin on admission once her acidosis corrected, she was transitioned to long-acting insulin plus sliding scale. She is tolerating her diet, will blood glucose trending up we will increase her long-acting continue sliding scale.  Abdominal pain/intractable nausea vomiting due to obstipation: A chronic issue abdominal x-ray showed large amount of stool burden as lwell CT scan of the abdomen pelvis showed no acute findings. Despite getting senna, docusate MiraLAX and sorbitol she has not had a bowel movement.   She did get a smog enema which she has refused to receive again, NG tube was attempted several times and was unsuccessful. NG tube placed at bedside, will start giving her GoLYTELY. Her demeanor towards her condition does not seem appropriate.  Pseudoseizures: None continue current medication.  Adjustment disorder: Continue BuSpar Lamictal and Seroquel.   DVT prophylaxis: lovenox Family Communication:none Status is: Inpatient  Remains inpatient appropriate because:Hemodynamically unstable   Dispo: The patient is from: Home              Anticipated d/c is to: Home              Anticipated d/c date is: 2 days              Patient currently is medically stable to d/c.  Code Status:     Code Status Orders  (From admission, onward)         Start     Ordered   08/23/19 1258  Full code  Continuous        08/23/19 1259        Code Status History    Date Active Date Inactive Code Status  Order ID Comments User Context   08/06/2019 1505 08/09/2019 1923 Full Code [id number]  Lanae Boast, MD ED   02/26/2019 2248 02/28/2019 1839 Full Code [id number]  Briscoe Deutscher, MD ED   01/27/2019 0430 01/29/2019 2313 Full Code [id number]  Eduard Clos, MD ED   12/19/2018 1439 12/23/2018 2134 Full Code [id number]  Ollen Bowl, MD ED   11/30/2018 2357 12/02/2018 1706 Full Code [id number]  Pearson Grippe, MD ED   11/28/2018 0138 11/30/2018 1802 Full Code [id number]  Briscoe Deutscher, MD ED   11/08/2018 0646 11/11/2018 1530 Full Code [id number]  Jackelyn Poling, NP Inpatient   11/06/2018 1640 11/07/2018 1747 Full Code [id number]  Maryagnes Amos, FNP Inpatient   10/08/2018 1535 10/15/2018 1500 Full Code [id number]  Clydie Braun, MD ED   10/01/2018 1833 10/02/2018 2059 Full Code [id number]  Milagros Loll, MD ED   09/15/2018 1816 09/17/2018 1729 Full Code [id number]  Money, Gerlene Burdock, FNP Inpatient   09/13/2018 0228 09/15/2018 1813 Full Code [id number]  Hillary Bow, DO ED   08/06/2018 1303 08/08/2018 1732 Full Code [id number]  Lahoma Crocker, MD ED   08/03/2018 2023 08/05/2018 1721 Full Code [id number]  Pieter Partridge, MD Inpatient   07/25/2018 0540 07/26/2018 1613 Full Code [id number]  Briscoe Deutscher, MD ED   06/14/2018 2159  06/15/2018 1944 Full Code [id number]  Rometta Emery, MD Inpatient   01/30/2018 0346 02/01/2018 1609 Full Code [id number]  Eduard Clos, MD ED   01/24/2018 1736 01/26/2018 1624 Full Code [id number]  Myrtie Neither, MD ED   12/24/2017 2356 12/26/2017 1612 Full Code [id number]  Briscoe Deutscher, MD ED   12/19/2017 1039 12/20/2017 1437 Full Code [id number]  Myrtie Neither, MD ED   12/16/2017 0358 12/16/2017 1335 Full Code [id number]  Hillary Bow, DO ED   12/14/2017 1517 12/15/2017 1944 Full Code [id number]  Rhetta Mura, MD ED   11/24/2017 0219 11/25/2017 1849 Full Code [id number]  Eduard Clos, MD Inpatient   Advance Care Planning Activity        IV  Access:    Peripheral IV   Procedures and diagnostic studies:   No results found.   Medical Consultants:    None.  Anti-Infectives:   none  Subjective:    Nancy [name] complaining of some mild abdominal pain tolerating liquids.  Objective:    Vitals:   09/02/19 1124 09/02/19 2155 09/03/19 0427 09/03/19 0742  BP: 118/81 137/79 117/65   Pulse: 88 (!) 106 94   Resp: 17 18 18    Temp: 98.8 F (37.1 C) 99.2 F (37.3 C) 98.8 F (37.1 C)   TempSrc: Oral Oral Oral   SpO2: 100% 100% 100%   Weight:    95.8 kg  Height:       SpO2: 100 % O2 Flow Rate (L/min): 0 L/min   Intake/Output Summary (Last 24 hours) at 09/03/2019 0847 Last data filed at 09/02/2019 1525 Gross per 24 hour  Intake 240 ml  Output 1 ml  Net 239 ml   Filed Weights   09/01/19 0449 09/02/19 0412 09/03/19 0742  Weight: 95.5 kg 97.2 kg 95.8 kg    Exam: General exam: In no acute distress. Respiratory system: Good air movement and clear to auscultation. Cardiovascular system: S1 & S2 heard, RRR. No JVD. Gastrointestinal system: Abdomen is nondistended, soft and nontender.  Extremities: No pedal edema. Skin: No rashes, lesions or ulcers   Data Reviewed:    Labs: Basic Metabolic Panel: Recent Labs  Lab 08/30/19 0622 08/30/19 0622 08/31/19 0531 08/31/19 0531 08/31/19 1018 08/31/19 1018 09/01/19 0647 09/02/19 0513 09/03/19 0243  NA 132*  --  136  --  135  --  135 138  --   K 3.8   < > 3.6   < > 3.8   < > 3.8 4.1  --   CL 101  --  106  --  106  --  104 108  --   CO2 20*  --  20*  --  19*  --  17* 21*  --   GLUCOSE 192*  --  153*  --  178*  --  272* 192*  --   BUN 6  --  5*  --  6  --  6 15  --   CREATININE 0.74  --  0.67  --  0.64  --  0.73 0.72  --   CALCIUM 8.8*  --  9.0  --  8.7*  --  8.8* 9.1  --   MG 1.8  --  2.1  --   --   --  1.9 2.0 2.0   < > = values in this interval not displayed.   GFR Estimated Creatinine Clearance: 122.6 mL/min (by C-G formula based on SCr of 0.72  mg/dL). Liver Function Tests: Recent Labs  Lab 08/30/19 0646  AST 38  ALT 48*  ALKPHOS 87  BILITOT 0.5  PROT 6.6  ALBUMIN 3.3*   No results for input(s): LIPASE, AMYLASE in the last 168 hours. No results for input(s): AMMONIA in the last 168 hours. Coagulation profile No results for input(s): INR, PROTIME in the last 168 hours. COVID-19 Labs  No results for input(s): DDIMER, FERRITIN, LDH, CRP in the last 72 hours.  Lab Results  Component Value Date   SARSCOV2NAA NEGATIVE 08/23/2019   SARSCOV2NAA NEGATIVE 08/06/2019   SARSCOV2NAA NEGATIVE 02/26/2019   SARSCOV2NAA NEGATIVE 01/27/2019    CBC: Recent Labs  Lab 08/30/19 1529 08/31/19 0531  WBC 8.6 8.6  HGB 11.7* 11.8*  HCT 37.2 37.7  MCV 79.8* 81.1  PLT 238 269   Cardiac Enzymes: No results for input(s): CKTOTAL, CKMB, CKMBINDEX, TROPONINI in the last 168 hours. BNP (last 3 results) No results for input(s): PROBNP in the last 8760 hours. CBG: Recent Labs  Lab 09/02/19 0645 09/02/19 1125 09/02/19 1554 09/02/19 2044 09/03/19 0601  GLUCAP 195* 208* 168* 131* 217*   D-Dimer: No results for input(s): DDIMER in the last 72 hours. Hgb A1c: No results for input(s): HGBA1C in the last 72 hours. Lipid Profile: No results for input(s): CHOL, HDL, LDLCALC, TRIG, CHOLHDL, LDLDIRECT in the last 72 hours. Thyroid function studies: No results for input(s): TSH, T4TOTAL, T3FREE, THYROIDAB in the last 72 hours.  Invalid input(s): FREET3 Anemia work up: No results for input(s): VITAMINB12, FOLATE, FERRITIN, TIBC, IRON, RETICCTPCT in the last 72 hours. Sepsis Labs: Recent Labs  Lab 08/30/19 1529 08/31/19 0531  WBC 8.6 8.6   Microbiology No results found for this or any previous visit (from the past 240 hour(s)).   Medications:   . ARIPiprazole  10 mg Oral BID  . atorvastatin  20 mg Oral q1800  . busPIRone  10 mg Oral TID  . enoxaparin (LOVENOX) injection  40 mg Subcutaneous Q24H  . insulin aspart  0-15 Units  Subcutaneous TID WC  . insulin aspart  0-5 Units Subcutaneous QHS  . insulin aspart  10 Units Subcutaneous TID WC  . insulin glargine  30 Units Subcutaneous BID  . lamoTRIgine  50 mg Oral TID  . metoCLOPramide (REGLAN) injection  10 mg Intravenous Q8H  . mirtazapine  7.5 mg Oral QHS  . pantoprazole  40 mg Oral BID  . polyethylene glycol-electrolytes  4,000 mL Oral Once  . propranolol  20 mg Oral BID  . QUEtiapine  300 mg Oral QHS  . senna-docusate  3 tablet Oral BID  . sorbitol, milk of mag, mineral oil, glycerin (SMOG) enema  960 mL Rectal Once  . topiramate  25 mg Oral BID   Continuous Infusions:     LOS: 11 days   Marinda Elk  [facility]  09/03/2019, 8:47 AM

## 2019-09-03 NOTE — Progress Notes (Addendum)
Daily Rounding Note  09/03/2019, 1:30 PM  LOS: 11 days   SUBJECTIVE:   Chief complaint: N/V, abd pain, constipation Still with foamy clear emesis per usual. Yesterday drank ~ 3/4 to 4/5 of golyelty but vomited a lot of it up and had no BMs. NGT now placed and she so far as of ~ noon is tolerating the remaining few 100 ccs (about 2 inches from bottom of jug) of Golyelty plus 2 iters of the new bottle.  Starting to pass flatus but no stool yet.  Finds NGT uncomfortable.  Very concerned she is able to discharge by Monday as she is starting a new job on Tuesday, and had quit her old job.       OBJECTIVE:         Vital signs in last 24 hours:    Temp:  [98.8 F (37.1 C)-99.2 F (37.3 C)] 99.1 F (37.3 C) (08/28 1133) Pulse Rate:  [94-106] 106 (08/28 1133) Resp:  [18] 18 (08/28 1133) BP: (117-137)/(65-81) 126/81 (08/28 1133) SpO2:  [99 %-100 %] 99 % (08/28 1133) Weight:  [95.8 kg] 95.8 kg (08/28 0742) Last BM Date:  (Pt states bowel movements are irregular and not often) Filed Weights   09/01/19 0449 09/02/19 0412 09/03/19 0742  Weight: 95.5 kg 97.2 kg 95.8 kg   General: pleasant, not ill looking   Heart: RRR Chest: clear bil.   Abdomen: soft, minor tenderness, obese.  BS present.    Extremities: no CCE Neuro/Psych:  Oriented x 3.  No tremor.  Fluid speech.  sliggtly anxious.    Intake/Output from previous day: 08/27 0701 - 08/28 0700 In: 540 [P.O.:540] Out: 1 [Urine:1]  Intake/Output this shift: No intake/output data recorded.  Lab Results: No results for input(s): WBC, HGB, HCT, PLT in the last 72 hours. BMET Recent Labs    09/01/19 0647 09/02/19 0513  NA 135 138  K 3.8 4.1  CL 104 108  CO2 17* 21*  GLUCOSE 272* 192*  BUN 6 15  CREATININE 0.73 0.72  CALCIUM 8.8* 9.1    Studies/Results: No results found.   Scheduled Meds: . ARIPiprazole  10 mg Oral BID  . atorvastatin  20 mg Oral q1800  .  busPIRone  10 mg Oral TID  . enoxaparin (LOVENOX) injection  40 mg Subcutaneous Q24H  . insulin aspart  0-15 Units Subcutaneous TID WC  . insulin aspart  0-5 Units Subcutaneous QHS  . insulin aspart  10 Units Subcutaneous TID WC  . insulin glargine  30 Units Subcutaneous BID  . lamoTRIgine  50 mg Oral TID  . metoCLOPramide (REGLAN) injection  10 mg Intravenous Q8H  . mirtazapine  7.5 mg Oral QHS  . pantoprazole  40 mg Oral BID  . propranolol  20 mg Oral BID  . QUEtiapine  300 mg Oral QHS  . senna-docusate  3 tablet Oral BID  . sorbitol, milk of mag, mineral oil, glycerin (SMOG) enema  960 mL Rectal Once  . topiramate  25 mg Oral BID   Continuous Infusions: . sodium chloride 100 mL/hr at 09/03/19 1039   PRN Meds:.acetaminophen, dextrose, hydrOXYzine, ketorolac, ondansetron (ZOFRAN) IV   ASSESMENT:   *   Acute on chronic N/V, abdominal pain Refractory sxs, failed mgt with multiple meds.   Constipation, severe.   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  Dx ~ age 87. A1c 11.8 3 to 4 weeks  ago.   Glucose 272 today.  On Lantus and novolog w additional SS novolog prn.      PLAN   *   Continue golyelty via tube.  If no BMsafter completing the current 4 litre jug, should we give additional Golyelty??     Jennye Moccasin  09/03/2019, 1:30 PM Phone [phone]  I have discussed the case with the PA, and that is the plan I formulated. I personally interviewed and examined the patient.  She is starting to have diarrhea after completing 4 L of GoLYTELY, and the NG tube was removed. She has the same diffuse unrelenting abdominal pain, though sitting on the edge of bed and looks comfortable, is conversational with a somewhat guarded affect.  Seana says she was able to eat a little food yesterday but threw up later in the day.  She was unable to take GoLYTELY by mouth yesterday.   It has been unclear to what extent constipation is  contributing to her abdominal pain or vomiting, and I felt that giving her bowel preparation was the only way to factor that out of this equation.  While she does have longstanding poorly controlled diabetes undoubtedly causing GI tract dysmotility, I also still feel that there is a significant element of chronic pain and anxiety overlay which I do not know how to contend with.  I do not believe we will be able to make her pain-free before leaving the hospital, and we have done everything we can with promotility agents to relieve symptoms.  Her improved diabetic control is clearly a long-term goal.  She seems hopeful for discharge tomorrow, which is something she had not previously expressed.  I am agreeable to that as long as she can keep down a modest amount of food.  I previous suggested a short-term trial of Ativan, which may still be worth considering under the direction of either the hospitalist or psychiatry.  It can sometimes be helpful for refractory nausea and vomiting if we think there is a significant anxiety component.  Discussed plan with the patient's nurse.  Total time 25 minutes Charlie Pitter III Office: [phone]

## 2019-09-04 ENCOUNTER — Inpatient Hospital Stay (HOSPITAL_COMMUNITY): Payer: Medicaid Other

## 2019-09-04 DIAGNOSIS — Z4659 Encounter for fitting and adjustment of other gastrointestinal appliance and device: Secondary | ICD-10-CM

## 2019-09-04 LAB — GLUCOSE, CAPILLARY
Glucose-Capillary: 58 mg/dL — ABNORMAL LOW (ref 70–99)
Glucose-Capillary: 60 mg/dL — ABNORMAL LOW (ref 70–99)
Glucose-Capillary: 81 mg/dL (ref 70–99)
Glucose-Capillary: 187 mg/dL — ABNORMAL HIGH (ref 70–99)

## 2019-09-04 LAB — MAGNESIUM: Magnesium: 1.9 mg/dL (ref 1.7–2.4)

## 2019-09-04 NOTE — Discharge Instructions (Signed)
Follow-up with: [facility] digestive health clinic [facility] [place], [place] [address]  Get Directions Appointments  [phone]  Sheral Flow - Fri: 7 am - 4:30 pm Contact us  [phone]

## 2019-09-04 NOTE — Plan of Care (Signed)
  Problem: Education: Goal: Knowledge of General Education information will improve Description: Including pain rating scale, medication(s)/side effects and non-pharmacologic comfort measures Outcome: Adequate for Discharge   Problem: Health Behavior/Discharge Planning: Goal: Ability to manage health-related needs will improve Outcome: Adequate for Discharge   Problem: Clinical Measurements: Goal: Ability to maintain clinical measurements within normal limits will improve Outcome: Adequate for Discharge Goal: Diagnostic test results will improve Outcome: Adequate for Discharge Goal: Respiratory complications will improve Outcome: Adequate for Discharge Goal: Cardiovascular complication will be avoided Outcome: Adequate for Discharge   Problem: Nutrition: Goal: Adequate nutrition will be maintained Outcome: Adequate for Discharge   Problem: Coping: Goal: Level of anxiety will decrease Outcome: Adequate for Discharge   Problem: Elimination: Goal: Will not experience complications related to bowel motility Outcome: Adequate for Discharge   Problem: Pain Managment: Goal: General experience of comfort will improve Outcome: Adequate for Discharge   Problem: Education: Goal: Ability to demonstrate management of disease process will improve Outcome: Adequate for Discharge Goal: Ability to verbalize understanding of medication therapies will improve Outcome: Adequate for Discharge Goal: Individualized Educational Video(s) Outcome: Adequate for Discharge   Problem: Activity: Goal: Capacity to carry out activities will improve Outcome: Adequate for Discharge   Problem: Cardiac: Goal: Ability to achieve and maintain adequate cardiopulmonary perfusion will improve Outcome: Adequate for Discharge

## 2019-09-04 NOTE — Discharge Summary (Signed)
Physician Discharge Summary  Nancy [name] WGN:[id number] DOB: 20-Jul-2000 DOA: 08/23/2019  PCP: Fleet Contras, MD  Admit date: 08/23/2019 Discharge date: 09/04/2019  Admitted From: Home Disposition:  Home  Recommendations for Outpatient Follow-up:  1. Follow up with PCP [facility] digestive [facility] clinic   Home Health:no Equipment/Devices:none  Discharge Condition:Stable CODE STATUS:Full Diet recommendation: Heart Healthy  Brief/Interim Summary: 19 y.o. 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  Abdominal pain/intractable nausea vomiting with obstipation: This seems to be a chronic issue, and abdominal x-ray was done that showed large amount of stool burden CT scan showed no SBO. She was given several regimens with no improvement in her GI tract her x-ray did show significant stool burden and she relate not having a bowel movement in over 7 days. She was given two bottles via an NG tube as she could not tolerate the MiraLAX, and she had multiple large bowel movements. An abdominal x-ray was done that showed stool burden had resolved. Her demeanor to her her condition does not seem appropriate and will need further evaluation by psych. She is also being referred to the dysmotility clinic over at [facility]. Her medications were reviewed  and her Bentyl was discontinued as this can lead to constipation.  Seizure disorder: None in house.  Adjustment disorder: Continue BuSpar Lamictal and Seroquel.    Discharge Instructions  Discharge Instructions    Diet - low sodium heart healthy   Complete by: As directed    Increase activity slowly   Complete by: As directed      Allergies as of 09/04/2019      Reactions   Ibuprofen Other (See Comments)   GI MD said to not take this anymore   Tomato    Oatmeal Rash      Medication List    STOP taking these medications   bisacodyl 10 MG suppository Commonly known as: DULCOLAX   dicyclomine 10 MG capsule Commonly known as: BENTYL   hydrOXYzine 50 MG tablet Commonly known as: ATARAX/VISTARIL   Motegrity 2 MG Tabs Generic drug: Prucalopride Succinate     TAKE these medications   Abilify 10 MG tablet Generic drug: ARIPiprazole Take 20 mg by mouth in the morning and at bedtime.   atorvastatin 20 MG tablet Commonly known as: LIPITOR Take 1 tablet (20 mg total) by mouth daily at 6 PM. What changed: when to take this   busPIRone 10 MG tablet Commonly known as: BUSPAR Take 10 mg by mouth 3 (three) times daily.   famotidine 40 MG tablet Commonly known as: PEPCID Take 40 mg by mouth 2 (two) times daily.   FDgard 25-20.75 MG Caps Generic drug: Car[place] Oil-Levomenthol Take 2 capsules by mouth as needed. Take as directed as needed What changed:   when to take this  additional instructions   lamoTRIgine 25 MG tablet Commonly known as: LAMICTAL  Take 2 tablets (50 mg total) by mouth 2 (two) times daily. For mood stabilization What changed: when to take this   Lantus SoloStar 100 UNIT/ML Solostar Pen Generic drug: insulin glargine Inject 70 Units into the skin at bedtime.   lidocaine 2 % solution Commonly known as: XYLOCAINE 3 mLs as needed for mouth pain.   lubiprostone 24 MCG capsule Commonly known as: AMITIZA Take 1 capsule (24 mcg total) by mouth  2 (two) times daily with a meal. What changed: when to take this   mirtazapine 7.5 MG tablet Commonly known as: REMERON Take 1 tablet (7.5 mg total) by mouth at bedtime. For depression/sleep   multivitamin with minerals Tabs tablet Take 1 tablet by mouth daily.   NovoLOG FlexPen 100 UNIT/ML FlexPen Generic drug: insulin aspart Inject 15 Units into the skin daily.   pantoprazole 40 MG tablet Commonly known as: PROTONIX Take 1 tablet (40 mg total) by mouth 2 (two) times daily. For acid reflux   polyethylene glycol 17 g packet Commonly known as: MIRALAX / GLYCOLAX Take 17 g by mouth daily. What changed:   when to take this  reasons to take this   potassium chloride SA 20 MEQ tablet Commonly known as: KLOR-CON Take 40 mEq by mouth 2 (two) times daily.   prochlorperazine 25 MG suppository Commonly known as: COMPAZINE Place 1 suppository (25 mg total) rectally every 12 (twelve) hours as needed for nausea or vomiting.   promethazine 25 MG suppository Commonly known as: PHENERGAN Place 25 mg rectally every 8 (eight) hours as needed for nausea or vomiting.   propranolol 20 MG tablet Commonly known as: INDERAL Take 1 tablet (20 mg total) by mouth 2 (two) times daily. For anxiety/HTN What changed: when to take this   QUEtiapine 50 MG tablet Commonly known as: SEROQUEL Take 50 mg by mouth 2 (two) times daily.   QUEtiapine 100 MG tablet Commonly known as: SEROQUEL Take 300 mg by mouth at bedtime.   sucralfate 1 GM/10ML suspension Commonly known as: CARAFATE Take 1 g by mouth 2 (two) times daily.   topiramate 25 MG tablet Commonly known as: TOPAMAX Take 1 tablet (25 mg total) by mouth 2 (two) times daily. For seizure activities   VOLTAREN EX Apply 1 application topically 2 (two) times daily as needed (chest pains).       Allergies  Allergen Reactions  . Ibuprofen Other (See Comments)    GI MD said to not take this anymore  . Tomato   . Oatmeal Rash     Consultations:  Gastroenterology.   Procedures/Studies: EEG  Result Date: 08/08/2019 Nancy Quest, MD     08/08/2019  4:35 PM Patient Name: Nancy [name] MRN: [id number] Epilepsy Attending: Charlsie [name] Referring Physician/Provider: Dr. Lacretia Nicks Date: 08/08/2019 Duration: 24.36 mins Patient history: 19 year old female with history of seizures/nonepileptic events.  EEG to assess for seizures. Level of alertness: Awake, asleep AEDs during EEG study: Lamotrigine, topiramate Technical aspects: This EEG study was done with scalp electrodes positioned according to the 10-20 International system of electrode placement. Electrical activity was acquired at a sampling rate of  and reviewed with a high frequency filter of  and a low frequency filter of . EEG data were recorded continuously and digitally stored. Description: The posterior dominant rhythm consists of 9 Hz activity of moderate voltage (25-35 uV) seen predominantly in posterior head regions, symmetric and reactive to eye opening and eye closing. Sleep was characterized by vertex waves, sleep spindles (  12 to 14 Hz), maximal frontocentral region. Hyperventilation and photic stimulation were not performed.   IMPRESSION: This study is within normal limits. No seizures or epileptiform discharges were seen throughout the recording. Nancy [name]   CT ABDOMEN PELVIS WO CONTRAST  Result Date: 08/26/2019 CLINICAL DATA:  Diffuse abdominal pain, nausea, vomiting, diarrhea EXAM: CT ABDOMEN AND PELVIS WITHOUT CONTRAST TECHNIQUE: Multidetector CT imaging of the abdomen and pelvis was performed following the standard protocol without IV contrast. COMPARISON:  08/24/2019, 02/26/2019 FINDINGS: Lower chest: No acute pleural or parenchymal lung disease. Hepatobiliary: No focal liver abnormality is seen. No gallstones, gallbladder wall thickening, or biliary dilatation. Pancreas: Unremarkable. No pancreatic ductal dilatation or  surrounding inflammatory changes. Spleen: Normal in size without focal abnormality. Adrenals/Urinary Tract: Adrenal glands are unremarkable. Kidneys are normal, without renal calculi, focal lesion, or hydronephrosis. Bladder is unremarkable. Stomach/Bowel: No bowel obstruction or ileus. No wall thickening or inflammatory change. Normal appendix right lower quadrant. Vascular/Lymphatic: No significant vascular findings are present. No enlarged abdominal or pelvic lymph nodes. Reproductive: Uterus and bilateral adnexa are unremarkable. Other: No free fluid or free gas.  No abdominal wall hernia. Musculoskeletal: No acute or destructive bony lesions. Reconstructed images demonstrate no additional findings. IMPRESSION: 1. No acute intra-abdominal or intrapelvic process. Electronically Signed   By: Sharlet Salina M.D.   On: 08/26/2019 15:05   DG Abd 1 View  Result Date: 09/03/2019 CLINICAL DATA:  Evaluate NG tube placement EXAM: ABDOMEN - 1 VIEW COMPARISON:  None. FINDINGS: A new NG tube terminates in the stomach within the left upper quadrant of the abdomen. IMPRESSION: The new NG tube terminates in the left upper quadrant of the abdomen, within the stomach. Electronically Signed   By: Gerome Sam III M.D   On: 09/03/2019 15:59   DG Abd 1 View  Result Date: 08/23/2019 CLINICAL DATA:  Abdominal pain.  Nausea vomiting.  Diarrhea. EXAM: ABDOMEN - 1 VIEW COMPARISON:  CT 02/26/2019. FINDINGS: Soft tissue structures are unremarkable. No bowel distention. Stool noted throughout the colon. No free air identified. Hemidiaphragms not imaged. No acute bony abnormality identified. IMPRESSION: No acute abnormality identified. No bowel distention noted. Stool noted throughout colon. Electronically Signed   By: Maisie Fus  Register   On: 08/23/2019 13:25   DG Chest Port 1 View  Result Date: 08/23/2019 CLINICAL DATA:  Hematemesis. EXAM: PORTABLE CHEST 1 VIEW COMPARISON:  02/26/2019. FINDINGS: Mediastinum hilar structures  normal. Low lung volumes. No focal infiltrate. No pleural effusion or pneumothorax. Heart size normal. No acute bony abnormality. IMPRESSION: Low lung volumes.  No acute cardiopulmonary disease identified. Electronically Signed   By: Maisie Fus  Register   On: 08/23/2019 10:05   DG Abd 2 Views  Result Date: 08/30/2019 CLINICAL DATA:  Persistent nausea, vomiting, abdominal pain EXAM: ABDOMEN - 2 VIEW COMPARISON:  08/24/2019 FINDINGS: Large stool burden throughout the colon. There is normal bowel gas pattern. No free air. No organomegaly or suspicious calcification. No acute bony abnormality. IMPRESSION: Large stool burden.  No acute findings. Electronically Signed   By: Charlett Nose M.D.   On: 08/30/2019 17:21   DG ABD ACUTE 2+V W 1V CHEST  Result Date: 08/24/2019 CLINICAL DATA:  Generalized abdominal pain with nausea and vomiting. EXAM: DG ABDOMEN ACUTE W/ 1V CHEST COMPARISON:  August 23, 2019 FINDINGS: There is no evidence of dilated bowel loops or free intraperitoneal air. A large amount of stool is seen throughout the colon. No radiopaque calculi or other significant radiographic abnormality is seen. Heart size  and mediastinal contours are within normal limits. Both lungs are clear. IMPRESSION: Negative abdominal radiographs.  No acute cardiopulmonary disease. Electronically Signed   By: Aram Candela M.D.   On: 08/24/2019 19:42    (Echo, Carotid, EGD, Colonoscopy, ERCP)    Subjective: Abdominal pain improved.  Discharge Exam: Vitals:   09/03/19 2057 09/04/19 0345  BP: 132/84 103/66  Pulse: 93 81  Resp: 18 18  Temp: 98.3 F (36.8 C) 98.5 F (36.9 C)  SpO2: 99% 98%   Vitals:   09/03/19 0742 09/03/19 1133 09/03/19 2057 09/04/19 0345  BP:  126/81 132/84 103/66  Pulse:  (!) 106 93 81  Resp:  18 18 18   Temp:  99.1 F (37.3 C) 98.3 F (36.8 C) 98.5 F (36.9 C)  TempSrc:  Oral Oral Oral  SpO2:  99% 99% 98%  Weight: 95.8 kg   97.7 kg  Height:        General: Pt is alert, awake,  not in acute distress Cardiovascular: RRR, S1/S2 +, no rubs, no gallops Respiratory: CTA bilaterally, no wheezing, no rhonchi Abdominal: Soft, NT, ND, bowel sounds + Extremities: no edema, no cyanosis    The results of significant diagnostics from this hospitalization (including imaging, microbiology, ancillary and laboratory) are listed below for reference.     Microbiology: No results found for this or any previous visit (from the past 240 hour(s)).   Labs: BNP (last 3 results) Recent Labs    09/10/18 1424 09/12/18 2244  BNP 13.1 13.8   Basic Metabolic Panel: Recent Labs  Lab 08/30/19 0622 08/30/19 0622 08/31/19 0531 08/31/19 1018 09/01/19 0647 09/02/19 0513 09/03/19 0243 09/04/19 0408  NA 132*  --  136 135 135 138  --   --   K 3.8  --  3.6 3.8 3.8 4.1  --   --   CL 101  --  106 106 104 108  --   --   CO2 20*  --  20* 19* 17* 21*  --   --   GLUCOSE 192*  --  153* 178* 272* 192*  --   --   BUN 6  --  5* 6 6 15   --   --   CREATININE 0.74  --  0.67 0.64 0.73 0.72  --   --   CALCIUM 8.8*  --  9.0 8.7* 8.8* 9.1  --   --   MG 1.8   < > 2.1  --  1.9 2.0 2.0 1.9   < > = values in this interval not displayed.   Liver Function Tests: Recent Labs  Lab 08/30/19 0646  AST 38  ALT 48*  ALKPHOS 87  BILITOT 0.5  PROT 6.6  ALBUMIN 3.3*   No results for input(s): LIPASE, AMYLASE in the last 168 hours. No results for input(s): AMMONIA in the last 168 hours. CBC: Recent Labs  Lab 08/30/19 1529 08/31/19 0531  WBC 8.6 8.6  HGB 11.7* 11.8*  HCT 37.2 37.7  MCV 79.8* 81.1  PLT 238 269   Cardiac Enzymes: No results for input(s): CKTOTAL, CKMB, CKMBINDEX, TROPONINI in the last 168 hours. BNP: Invalid input(s): POCBNP CBG: Recent Labs  Lab 09/03/19 2058 09/04/19 0325 09/04/19 0342 09/04/19 0356 09/04/19 0621  GLUCAP 209* 58* 60* 81 187*   D-Dimer No results for input(s): DDIMER in the last 72 hours. Hgb A1c No results for input(s): HGBA1C in the last 72  hours. Lipid Profile No results for input(s): CHOL, HDL, LDLCALC, TRIG, CHOLHDL, LDLDIRECT  in the last 72 hours. Thyroid function studies No results for input(s): TSH, T4TOTAL, T3FREE, THYROIDAB in the last 72 hours.  Invalid input(s): FREET3 Anemia work up No results for input(s): VITAMINB12, FOLATE, FERRITIN, TIBC, IRON, RETICCTPCT in the last 72 hours. Urinalysis    Component Value Date/Time   COLORURINE YELLOW 08/23/2019 0942   APPEARANCEUR HAZY (A) 08/23/2019 0942   LABSPEC 1.038 (H) 08/23/2019 0942   PHURINE 6.0 08/23/2019 0942   GLUCOSEU >=500 (A) 08/23/2019 0942   HGBUR SMALL (A) 08/23/2019 0942   BILIRUBINUR NEGATIVE 08/23/2019 0942   BILIRUBINUR negative 07/13/2018 1651   KETONESUR 80 (A) 08/23/2019 0942   PROTEINUR NEGATIVE 08/23/2019 0942   UROBILINOGEN 0.2 07/13/2018 1651   UROBILINOGEN 1.0 07/09/2017 1324   NITRITE NEGATIVE 08/23/2019 0942   LEUKOCYTESUR NEGATIVE 08/23/2019 0942   Sepsis Labs Invalid input(s): PROCALCITONIN,  WBC,  LACTICIDVEN Microbiology No results found for this or any previous visit (from the past 240 hour(s)).   Time coordinating discharge: Over 30 minutes  SIGNED:   Marinda Elk, MD  [facility] 09/04/2019, 7:05 AM Pager   If 7PM-7AM, please contact night-coverage [email or web address] Password [email or web address]

## 2019-09-04 NOTE — Progress Notes (Signed)
Pt reported not feeling well.  Checked CBG. CBG 58.  Will follow hyperglycemic protocol. Recheck CBG and inform provider.

## 2019-09-04 NOTE — Progress Notes (Signed)
D/C instructions given and reviewed. No questions asked. Encouraged to call with any questions. IV removed, tolerated well.

## 2019-09-10 IMAGING — CT CT ANGIO CHEST
2 of 6 series · 18 of 36 positions shown · IV contrast (omnipaque)
Comparison: Prior CT scan of the heart 01/14/2018

CLINICAL DATA: 18-year-old female with shortness of breath and high
pretest probability for pulmonary embolism.

EXAM:
CT ANGIOGRAPHY CHEST WITH CONTRAST
TECHNIQUE: Multidetector CT imaging of the chest was performed using the
standard protocol during bolus administration of intravenous
contrast. Multiplanar CT image reconstructions and MIPs were
obtained to evaluate the vascular anatomy.
CONTRAST:  100mL OMNIPAQUE IOHEXOL 350 MG/ML SOLN

[Series 8: pe thins · axial · 0.76mm/px · z∈[-217,-7]mm · 17 of 334 slices shown]
[im 17/334  lung]
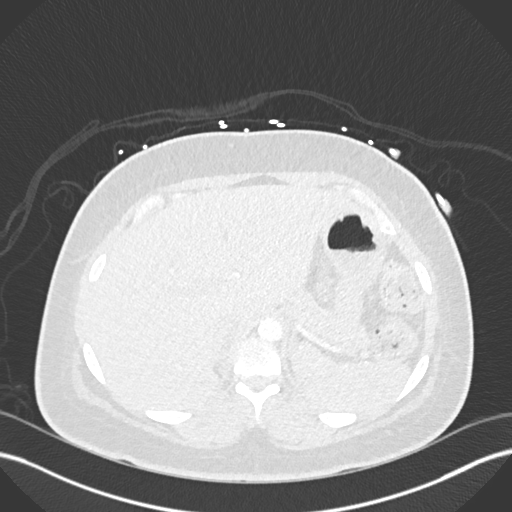
[im 34/334  mediastinal]
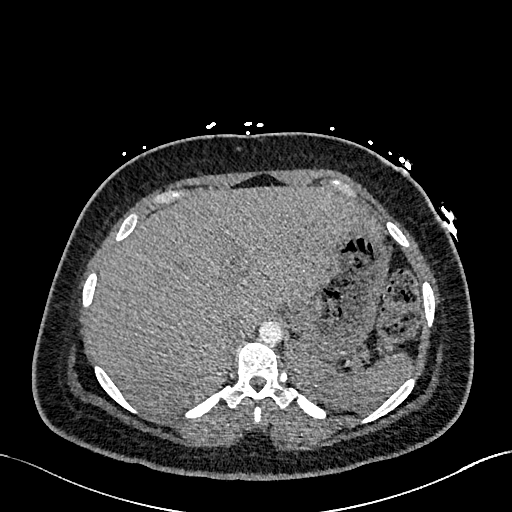
[im 50/334  lung]
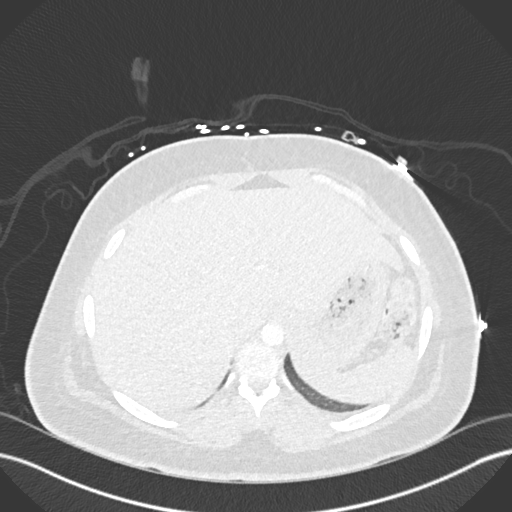
[im 67/334  mediastinal]
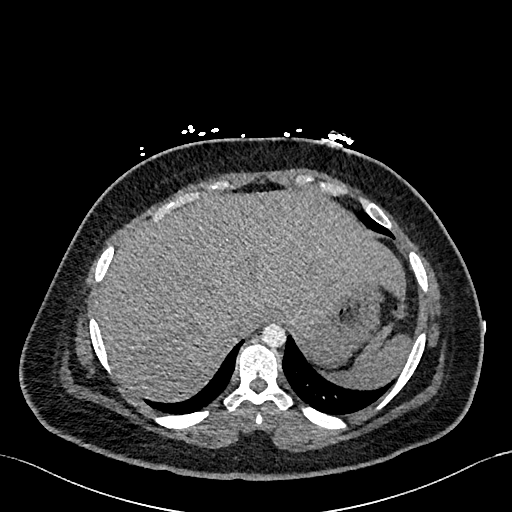
[im 100/334  lung]
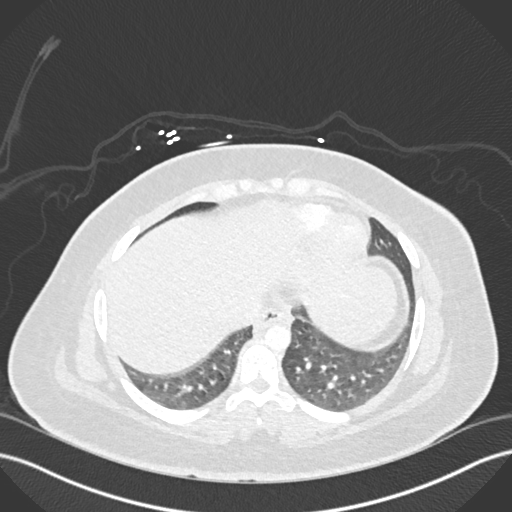
[im 117/334  mediastinal]
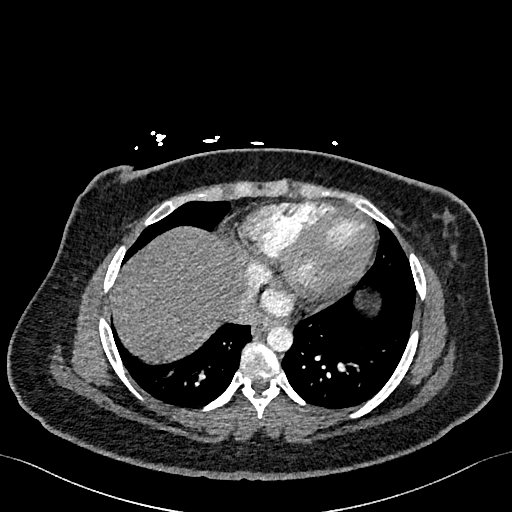
[im 134/334  lung]
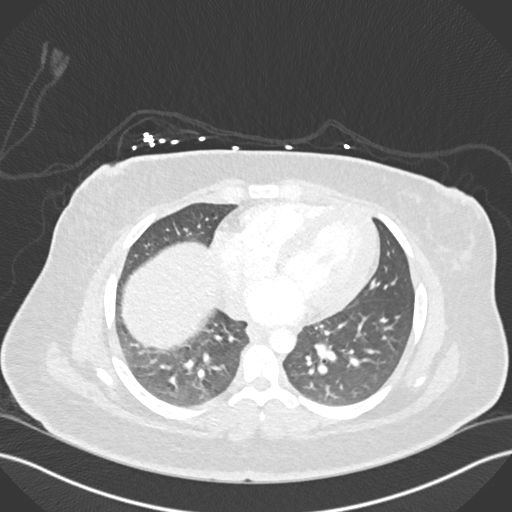
[im 150/334  mediastinal]
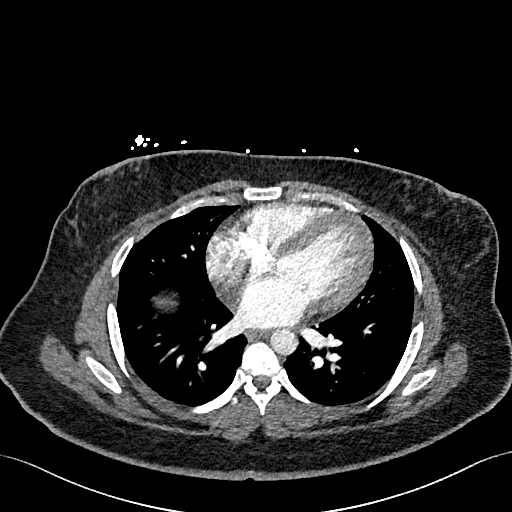
[im 167/334  lung]
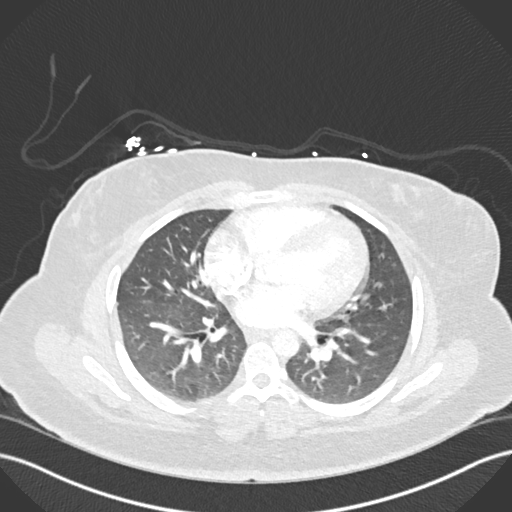
[im 184/334  mediastinal]
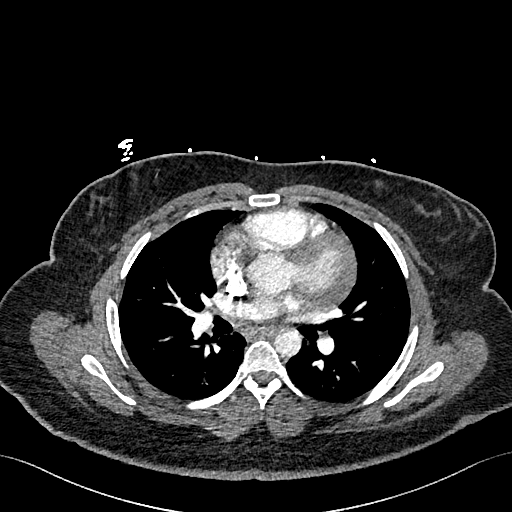
[im 200/334  lung]
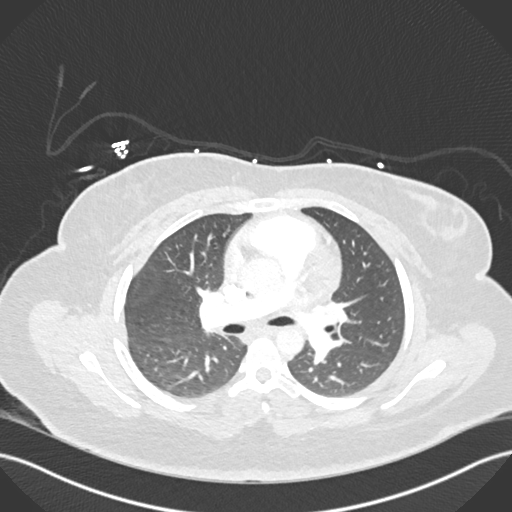
[im 217/334  mediastinal]
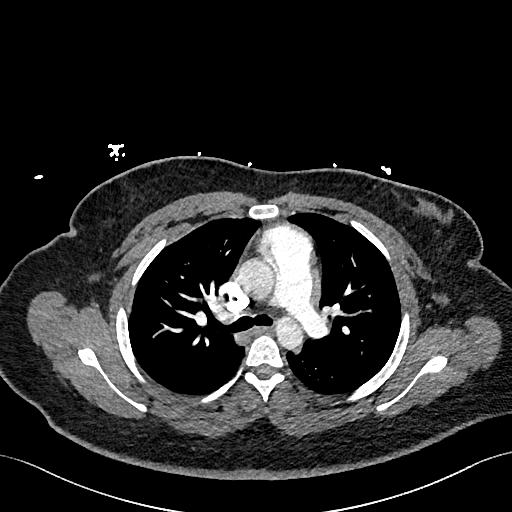
[im 234/334  lung]
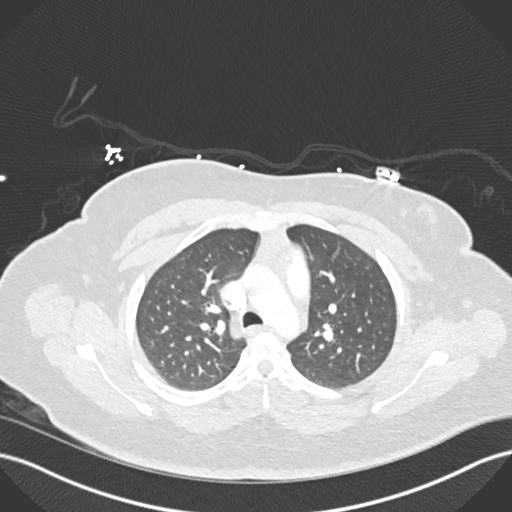
[im 267/334  mediastinal]
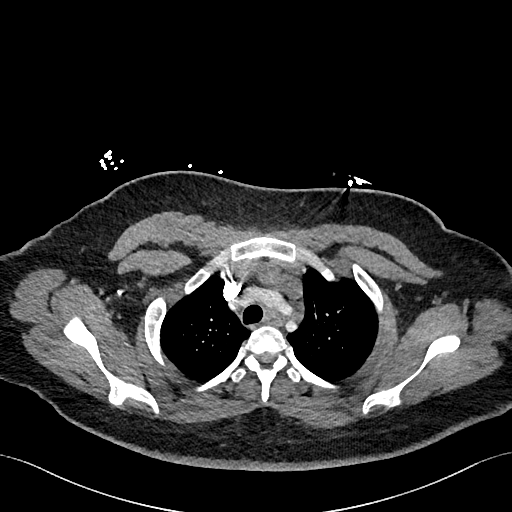
[im 284/334  lung]
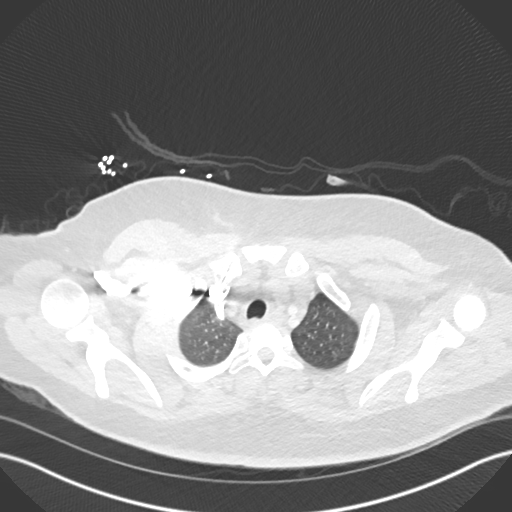
[im 300/334  mediastinal]
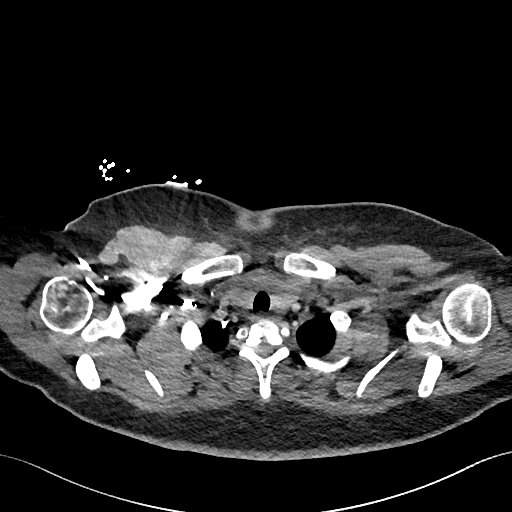
[im 317/334  lung]
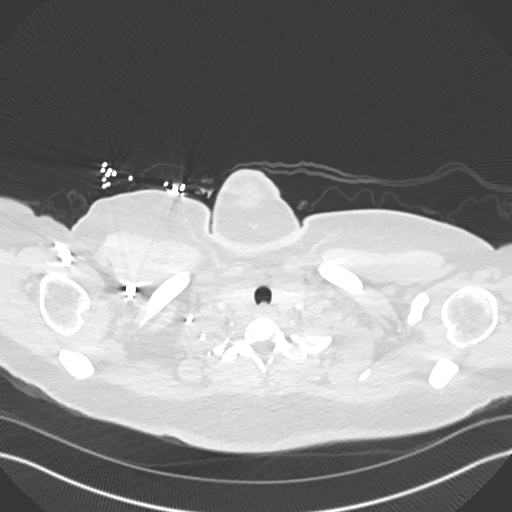

[Series 9: pe 2mm cor · coronal · 0.48mm/px · 1 of 150 slices shown]
[im 75/150  mediastinal]
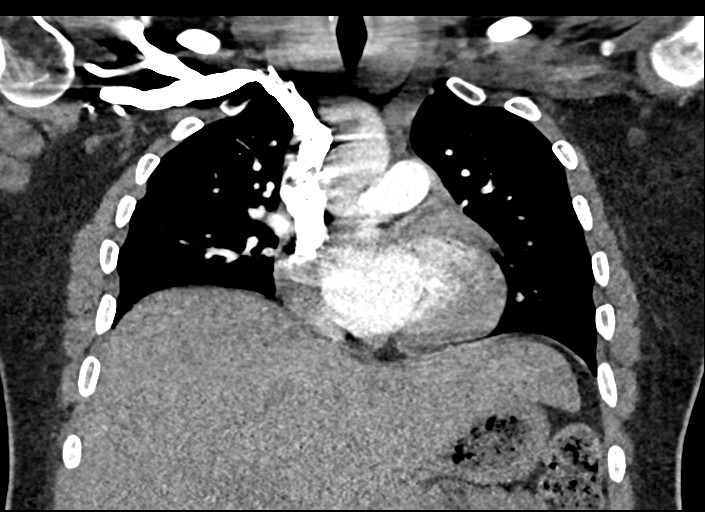

[18 of 36 positions shown; findings below may reference images not displayed]

FINDINGS: Cardiovascular: Adequate opacification of the pulmonary arteries to
the segmental level. No evidence of central filling defect to
suggest acute pulmonary embolus. The main pulmonary artery is normal
in size. 2 vessel aortic arch. The right brachiocephalic and left
common carotid artery share a common origin. No evidence of aneurysm
or dissection. The heart is normal in size. No pericardial effusion.

Mediastinum/Nodes: Unremarkable thyroid gland. Triangular soft
tissue density in the anterior mediastinum consistent with residual
thymus. No suspicious lymphadenopathy. Unremarkable esophagus.

Lungs/Pleura: Lungs are clear. No pleural effusion or pneumothorax.

Upper Abdomen: No acute abnormality.

Musculoskeletal: No acute fracture or aggressive appearing lytic or
blastic osseous lesion.

Review of the MIP images confirms the above findings.
IMPRESSION: 1. Negative for acute pulmonary embolus, pneumonia or other acute
cardiopulmonary process.

## 2019-09-10 IMAGING — CR DG CHEST 2V
2 series · 2 of 2 positions shown · non-contrast
Comparison: 09/03/2018

CLINICAL DATA: Left-sided chest pain with shortness of breath

EXAM:
CHEST - 2 VIEW

[chest pa]
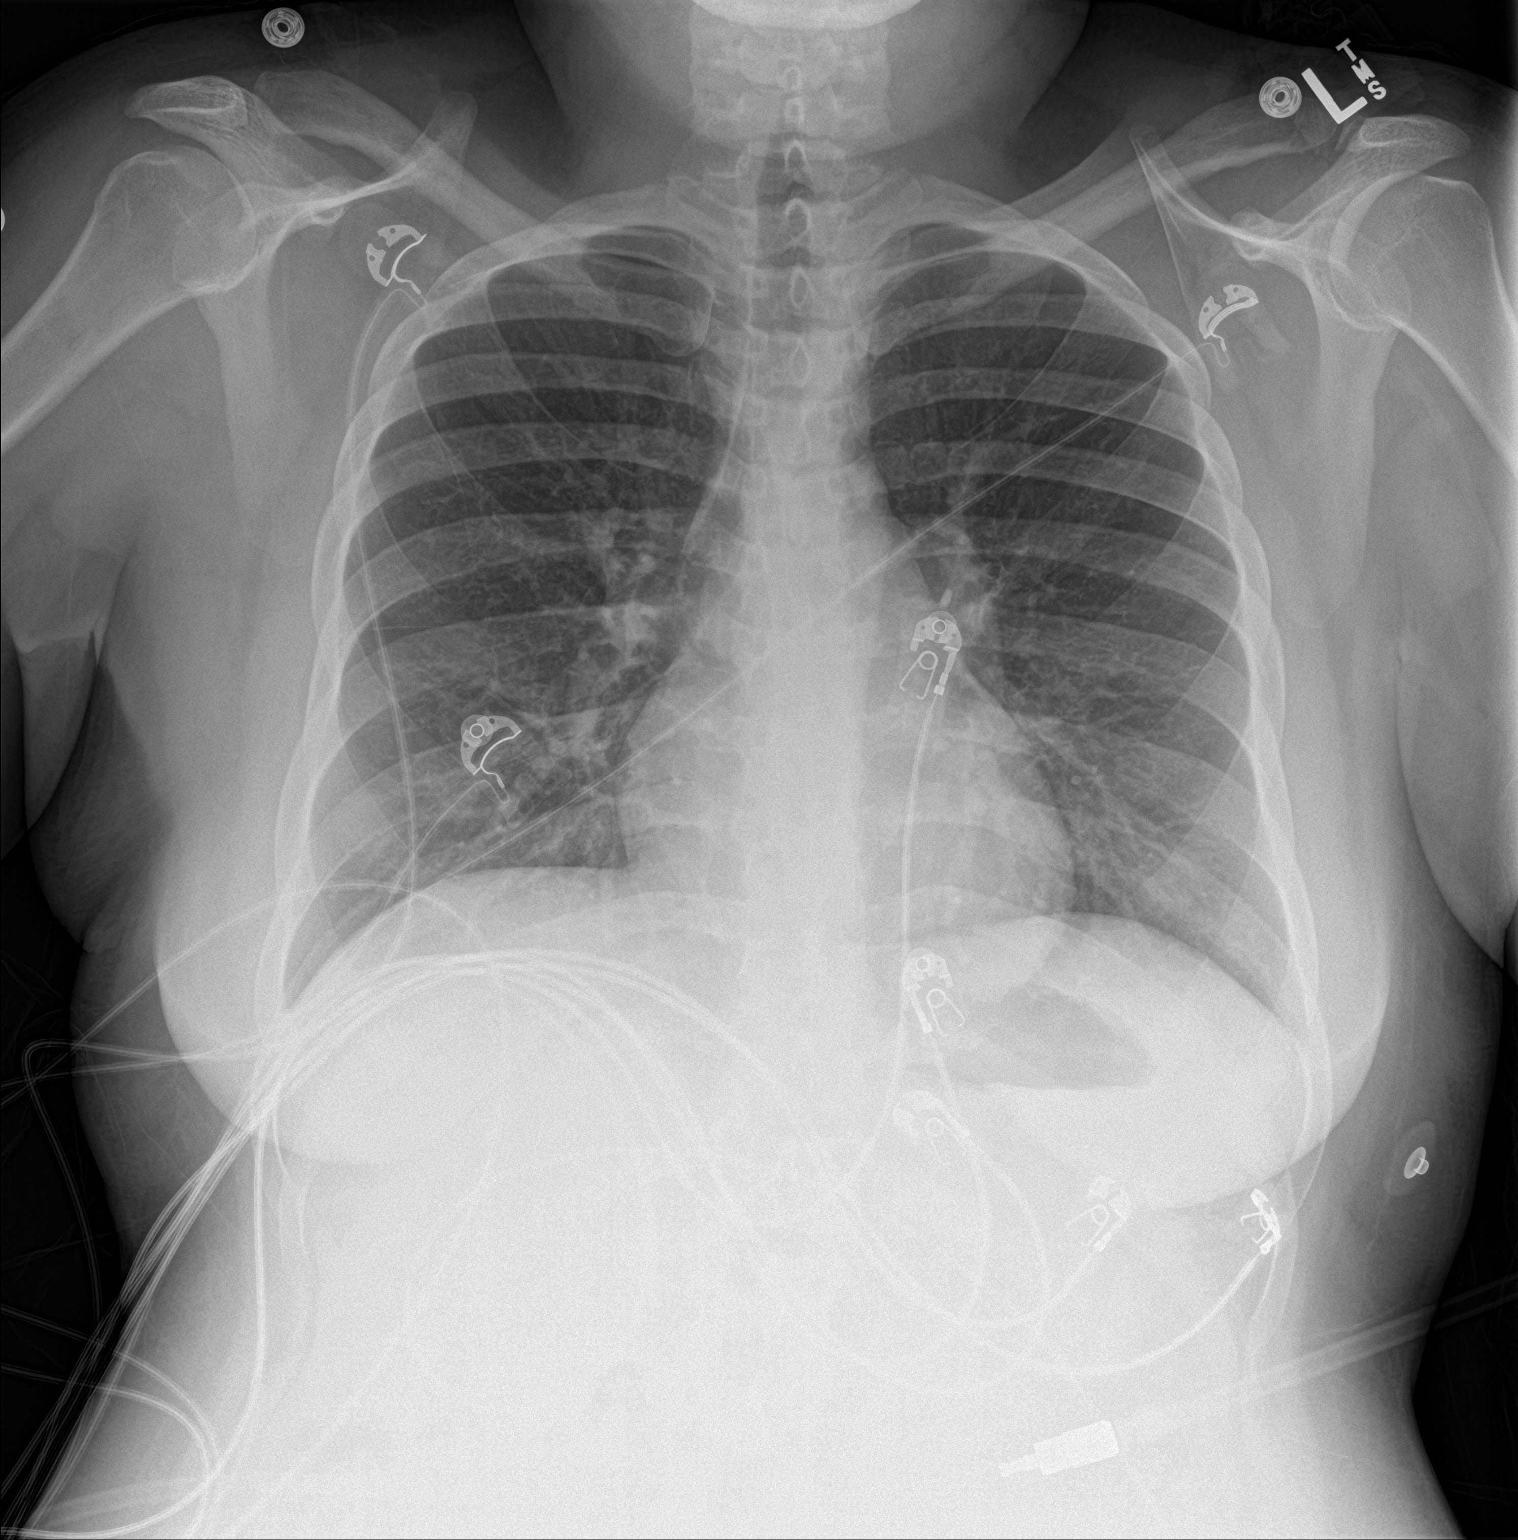

[chest lat]
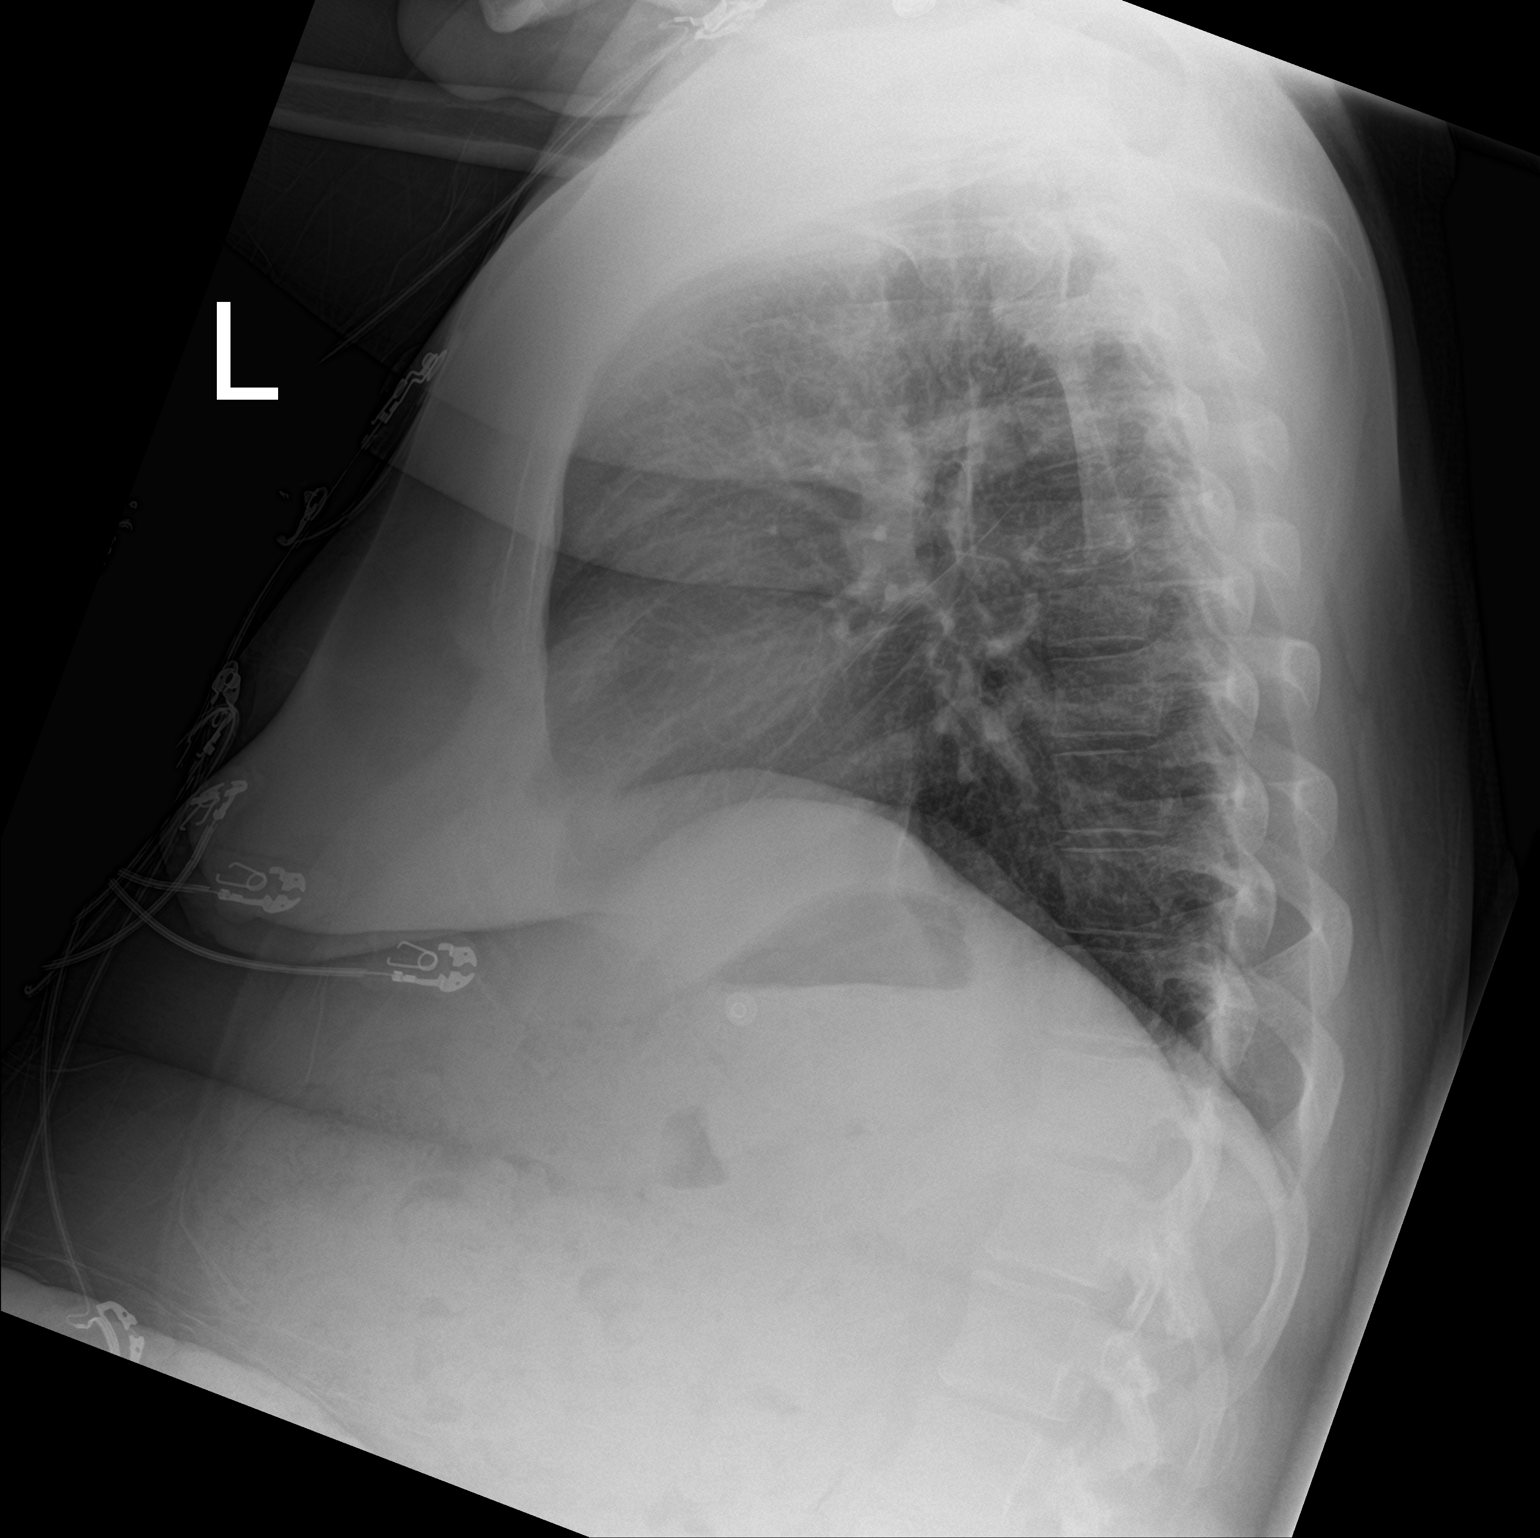

[2 of 2 positions shown; findings below may reference images not displayed]

FINDINGS: The heart size and mediastinal contours are within normal limits.
Both lungs are clear. The visualized skeletal structures are
unremarkable.
IMPRESSION: No active cardiopulmonary disease.

## 2019-09-13 IMAGING — DX DG CHEST 2V
2 series · 2 of 2 positions shown · non-contrast
Comparison: September 10, 2018

CLINICAL DATA: Chest pain

EXAM:
CHEST - 2 VIEW

[chest pa]
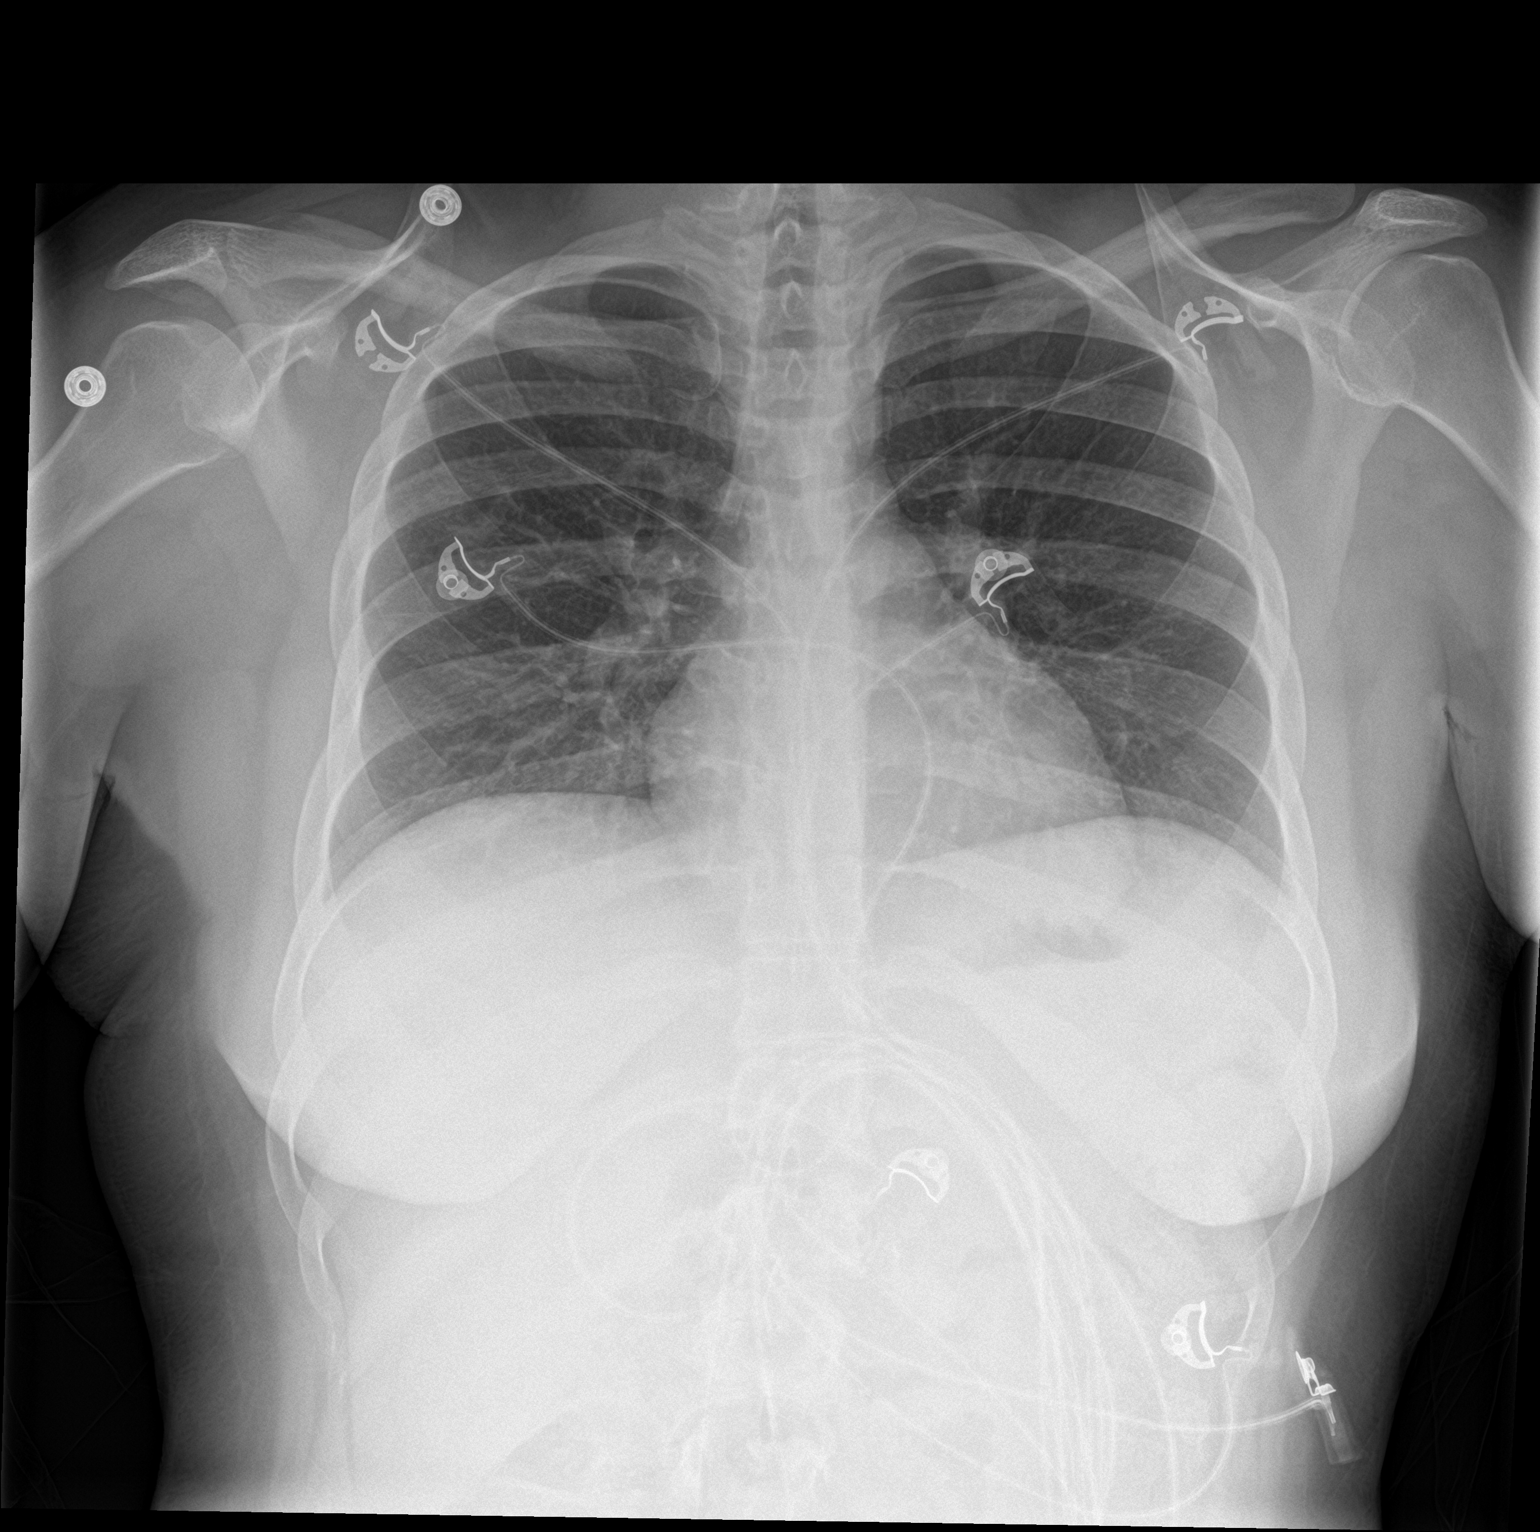

[chest lat]
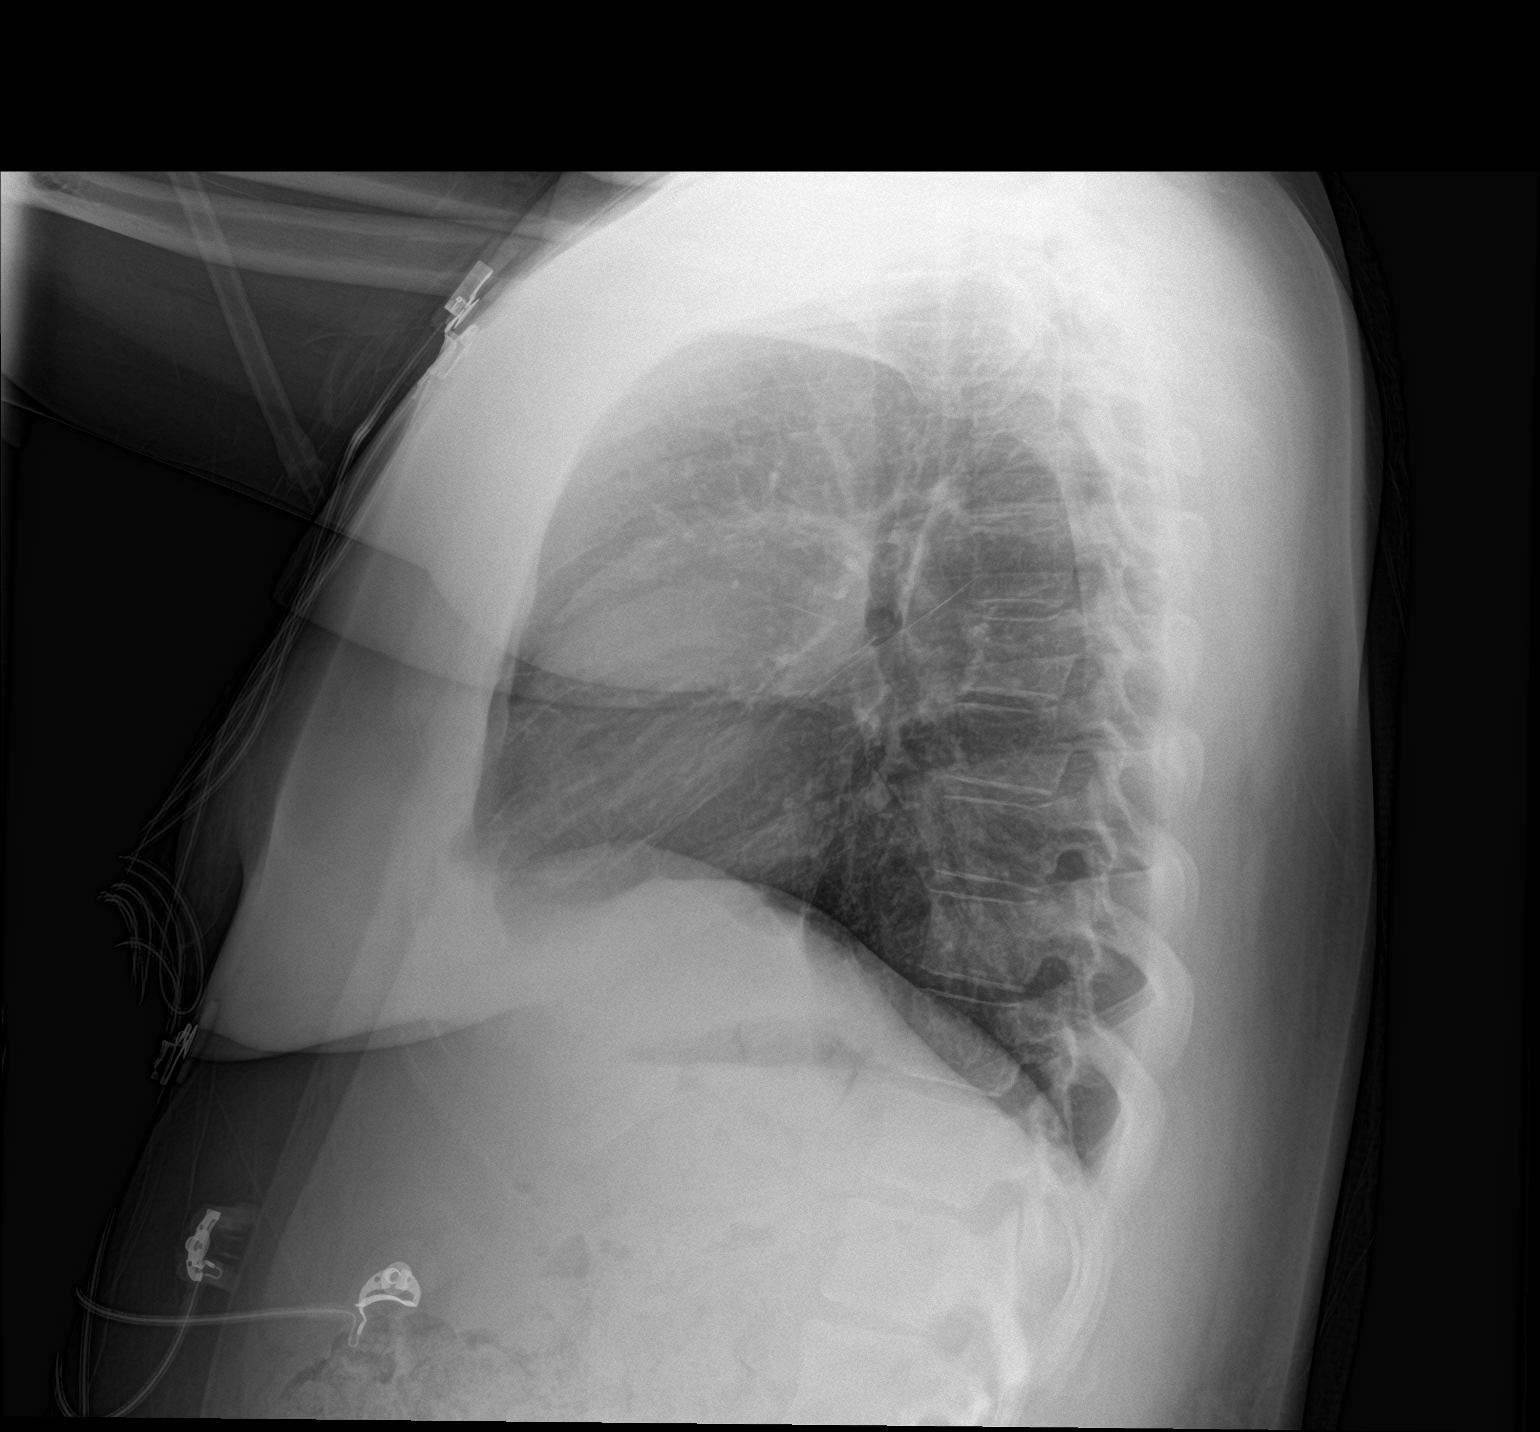

[2 of 2 positions shown; findings below may reference images not displayed]

FINDINGS: The heart size and mediastinal contours are within normal limits.
Both lungs are clear. The visualized skeletal structures are
unremarkable.
IMPRESSION: No acute cardiopulmonary disease.

## 2019-09-30 ENCOUNTER — Emergency Department (HOSPITAL_COMMUNITY): Payer: Medicaid Other

## 2019-09-30 ENCOUNTER — Inpatient Hospital Stay (HOSPITAL_COMMUNITY)
Admission: EM | Admit: 2019-09-30 | Discharge: 2019-10-03 | DRG: 392 | Disposition: A | Discharge status: Home / Self Care | Payer: Medicaid Other | Attending: Internal Medicine | Admitting: Internal Medicine

## 2019-09-30 ENCOUNTER — Encounter (HOSPITAL_COMMUNITY): Payer: Self-pay | Admitting: Emergency Medicine

## 2019-09-30 ENCOUNTER — Other Ambulatory Visit: Payer: Self-pay

## 2019-09-30 ENCOUNTER — Observation Stay (HOSPITAL_COMMUNITY): Payer: Medicaid Other

## 2019-09-30 DIAGNOSIS — I10 Essential (primary) hypertension: Secondary | ICD-10-CM | POA: Diagnosis present

## 2019-09-30 DIAGNOSIS — Z20822 Contact with and (suspected) exposure to covid-19: Secondary | ICD-10-CM | POA: Diagnosis present

## 2019-09-30 DIAGNOSIS — Z79899 Other long term (current) drug therapy: Secondary | ICD-10-CM

## 2019-09-30 DIAGNOSIS — I110 Hypertensive heart disease with heart failure: Secondary | ICD-10-CM | POA: Diagnosis present

## 2019-09-30 DIAGNOSIS — F1290 Cannabis use, unspecified, uncomplicated: Secondary | ICD-10-CM | POA: Diagnosis present

## 2019-09-30 DIAGNOSIS — E860 Dehydration: Secondary | ICD-10-CM | POA: Diagnosis present

## 2019-09-30 DIAGNOSIS — R569 Unspecified convulsions: Secondary | ICD-10-CM | POA: Diagnosis present

## 2019-09-30 DIAGNOSIS — R1115 Cyclical vomiting syndrome unrelated to migraine: Secondary | ICD-10-CM | POA: Diagnosis present

## 2019-09-30 DIAGNOSIS — I152 Hypertension secondary to endocrine disorders: Secondary | ICD-10-CM | POA: Diagnosis present

## 2019-09-30 DIAGNOSIS — Z8049 Family history of malignant neoplasm of other genital organs: Secondary | ICD-10-CM

## 2019-09-30 DIAGNOSIS — Z825 Family history of asthma and other chronic lower respiratory diseases: Secondary | ICD-10-CM

## 2019-09-30 DIAGNOSIS — G8929 Other chronic pain: Secondary | ICD-10-CM | POA: Diagnosis present

## 2019-09-30 DIAGNOSIS — R112 Nausea with vomiting, unspecified: Secondary | ICD-10-CM | POA: Diagnosis not present

## 2019-09-30 DIAGNOSIS — R109 Unspecified abdominal pain: Secondary | ICD-10-CM | POA: Diagnosis present

## 2019-09-30 DIAGNOSIS — Z794 Long term (current) use of insulin: Secondary | ICD-10-CM

## 2019-09-30 DIAGNOSIS — F419 Anxiety disorder, unspecified: Secondary | ICD-10-CM | POA: Diagnosis present

## 2019-09-30 DIAGNOSIS — G894 Chronic pain syndrome: Secondary | ICD-10-CM | POA: Diagnosis present

## 2019-09-30 DIAGNOSIS — F329 Major depressive disorder, single episode, unspecified: Secondary | ICD-10-CM | POA: Diagnosis present

## 2019-09-30 DIAGNOSIS — I5032 Chronic diastolic (congestive) heart failure: Secondary | ICD-10-CM | POA: Diagnosis present

## 2019-09-30 DIAGNOSIS — K047 Periapical abscess without sinus: Secondary | ICD-10-CM | POA: Diagnosis present

## 2019-09-30 DIAGNOSIS — Z800 Family history of malignant neoplasm of digestive organs: Secondary | ICD-10-CM

## 2019-09-30 DIAGNOSIS — Z8249 Family history of ischemic heart disease and other diseases of the circulatory system: Secondary | ICD-10-CM

## 2019-09-30 DIAGNOSIS — F445 Conversion disorder with seizures or convulsions: Secondary | ICD-10-CM | POA: Diagnosis present

## 2019-09-30 DIAGNOSIS — R1110 Vomiting, unspecified: Secondary | ICD-10-CM

## 2019-09-30 DIAGNOSIS — Z6837 Body mass index (BMI) 37.0-37.9, adult: Secondary | ICD-10-CM

## 2019-09-30 DIAGNOSIS — Z83438 Family history of other disorder of lipoprotein metabolism and other lipidemia: Secondary | ICD-10-CM

## 2019-09-30 DIAGNOSIS — Z833 Family history of diabetes mellitus: Secondary | ICD-10-CM

## 2019-09-30 LAB — CBC
HCT: 37.8 % (ref 36.0–46.0)
Hemoglobin: 11.6 g/dL — ABNORMAL LOW (ref 12.0–15.0)
MCH: 24.7 pg — ABNORMAL LOW (ref 26.0–34.0)
MCHC: 30.7 g/dL (ref 30.0–36.0)
MCV: 80.4 fL (ref 80.0–100.0)
Platelets: 303 10*3/uL (ref 150–400)
RBC: 4.7 MIL/uL (ref 3.87–5.11)
RDW: 15 % (ref 11.5–15.5)
WBC: 9.2 10*3/uL (ref 4.0–10.5)
nRBC: 0 % (ref 0.0–0.2)

## 2019-09-30 LAB — BASIC METABOLIC PANEL
Anion gap: 9 (ref 5–15)
BUN: 5 mg/dL — ABNORMAL LOW (ref 6–20)
CO2: 28 mmol/L (ref 22–32)
Calcium: 9.1 mg/dL (ref 8.9–10.3)
Chloride: 100 mmol/L (ref 98–111)
Creatinine, Ser: 0.62 mg/dL (ref 0.44–1.00)
GFR calc Af Amer: >60 mL/min (ref >60)
GFR calc non Af Amer: >60 mL/min (ref >60)
Glucose, Bld: 367 mg/dL — ABNORMAL HIGH (ref 70–99)
Potassium: 4.3 mmol/L (ref 3.5–5.1)
Sodium: 137 mmol/L (ref 135–145)

## 2019-09-30 LAB — I-STAT BETA HCG BLOOD, ED (MC, WL, AP ONLY): I-stat hCG, quantitative: <5 m[IU]/mL (ref <5)

## 2019-09-30 LAB — CBG MONITORING, ED
Glucose-Capillary: 383 mg/dL — ABNORMAL HIGH (ref 70–99)
Glucose-Capillary: 365 mg/dL — ABNORMAL HIGH (ref 70–99)

## 2019-09-30 LAB — RESPIRATORY PANEL BY RT PCR (FLU A&B, COVID)
Influenza A by PCR: NEGATIVE
Influenza B by PCR: NEGATIVE
SARS Coronavirus 2 by RT PCR: NEGATIVE

## 2019-09-30 MED ORDER — ONDANSETRON HCL 4 MG/2ML IJ SOLN
4.0000 mg | Freq: Four times a day (QID) | INTRAMUSCULAR | Status: DC | PRN
  Administered 2019-10-01: 4 mg via INTRAVENOUS
  Filled 2019-09-30 (×2): qty 2

## 2019-09-30 MED ORDER — HALOPERIDOL LACTATE 5 MG/ML IJ SOLN
2.0000 mg | Freq: Once | INTRAMUSCULAR | Status: AC
  Administered 2019-09-30: 2 mg via INTRAVENOUS
  Filled 2019-09-30: qty 1

## 2019-09-30 MED ORDER — DIPHENHYDRAMINE HCL 50 MG/ML IJ SOLN
25.0000 mg | Freq: Once | INTRAMUSCULAR | Status: AC
  Administered 2019-09-30: 25 mg via INTRAVENOUS
  Filled 2019-09-30: qty 1

## 2019-09-30 MED ORDER — ENOXAPARIN SODIUM 40 MG/0.4ML ~~LOC~~ SOLN
40.0000 mg | SUBCUTANEOUS | Status: DC
  Administered 2019-10-01 – 2019-10-03 (×3): 40 mg via SUBCUTANEOUS
  Filled 2019-09-30 (×3): qty 0.4

## 2019-09-30 MED ORDER — LAMOTRIGINE 25 MG PO TABS
25.0000 mg | ORAL_TABLET | Freq: Three times a day (TID) | ORAL | Status: DC
  Administered 2019-10-01 – 2019-10-03 (×7): 25 mg via ORAL
  Filled 2019-09-30 (×9): qty 1

## 2019-09-30 MED ORDER — ONDANSETRON HCL 4 MG/2ML IJ SOLN: 4.0000 mg | Freq: Once | INTRAMUSCULAR | Status: DC

## 2019-09-30 MED ORDER — INSULIN ASPART 100 UNIT/ML ~~LOC~~ SOLN
0.0000 [IU] | Freq: Every day | SUBCUTANEOUS | Status: DC
  Administered 2019-10-01: 5 [IU] via SUBCUTANEOUS

## 2019-09-30 MED ORDER — LORAZEPAM 2 MG/ML IJ SOLN
1.0000 mg | Freq: Once | INTRAMUSCULAR | Status: DC
  Filled 2019-09-30: qty 1

## 2019-09-30 MED ORDER — TOPIRAMATE 25 MG PO TABS
25.0000 mg | ORAL_TABLET | Freq: Two times a day (BID) | ORAL | Status: DC
  Administered 2019-10-01 – 2019-10-03 (×5): 25 mg via ORAL
  Filled 2019-09-30 (×6): qty 1

## 2019-09-30 MED ORDER — IOHEXOL 300 MG/ML  SOLN: 75.0000 mL | Freq: Once | INTRAMUSCULAR | Status: DC | PRN

## 2019-09-30 MED ORDER — LORAZEPAM 2 MG/ML IJ SOLN
0.5000 mg | Freq: Once | INTRAMUSCULAR | Status: AC
  Administered 2019-09-30: 0.5 mg via INTRAVENOUS

## 2019-09-30 MED ORDER — SODIUM CHLORIDE 0.9 % IV SOLN
Freq: Once | INTRAVENOUS | Status: AC
  Administered 2019-09-30: 1000 mL via INTRAVENOUS

## 2019-09-30 MED ORDER — SUCRALFATE 1 GM/10ML PO SUSP
1.0000 g | Freq: Two times a day (BID) | ORAL | Status: DC
  Administered 2019-10-01 – 2019-10-03 (×6): 1 g via ORAL
  Filled 2019-09-30 (×6): qty 10

## 2019-09-30 MED ORDER — HYDROMORPHONE HCL 1 MG/ML IJ SOLN
1.0000 mg | Freq: Once | INTRAMUSCULAR | Status: AC
  Administered 2019-09-30: 1 mg via INTRAVENOUS
  Filled 2019-09-30: qty 1

## 2019-09-30 MED ORDER — ONDANSETRON HCL 4 MG PO TABS
4.0000 mg | ORAL_TABLET | Freq: Four times a day (QID) | ORAL | Status: DC | PRN
  Administered 2019-10-01: 4 mg via ORAL
  Filled 2019-09-30 (×2): qty 1

## 2019-09-30 MED ORDER — IOHEXOL 300 MG/ML  SOLN
75.0000 mL | Freq: Once | INTRAMUSCULAR | Status: AC | PRN
  Administered 2019-09-30: 75 mL via INTRAVENOUS

## 2019-09-30 MED ORDER — INSULIN ASPART 100 UNIT/ML ~~LOC~~ SOLN
0.0000 [IU] | Freq: Three times a day (TID) | SUBCUTANEOUS | Status: DC
  Administered 2019-10-01: 11 [IU] via SUBCUTANEOUS
  Administered 2019-10-01: 7 [IU] via SUBCUTANEOUS
  Administered 2019-10-02: 3 [IU] via SUBCUTANEOUS
  Administered 2019-10-02 (×2): 4 [IU] via SUBCUTANEOUS
  Administered 2019-10-03: 3 [IU] via SUBCUTANEOUS

## 2019-09-30 MED ORDER — FAMOTIDINE 20 MG PO TABS: 40.0000 mg | ORAL_TABLET | Freq: Two times a day (BID) | ORAL | Status: DC

## 2019-09-30 MED ORDER — METOCLOPRAMIDE HCL 5 MG/ML IJ SOLN
10.0000 mg | Freq: Once | INTRAMUSCULAR | Status: AC
  Administered 2019-09-30: 10 mg via INTRAVENOUS
  Filled 2019-09-30: qty 2

## 2019-09-30 MED ORDER — IOHEXOL 300 MG/ML  SOLN
80.0000 mL | Freq: Once | INTRAMUSCULAR | Status: AC | PRN
  Administered 2019-09-30: 80 mL via INTRAVENOUS

## 2019-09-30 MED ORDER — CLINDAMYCIN PHOSPHATE 900 MG/50ML IV SOLN
900.0000 mg | Freq: Once | INTRAVENOUS | Status: AC
  Administered 2019-09-30: 900 mg via INTRAVENOUS
  Filled 2019-09-30: qty 50

## 2019-09-30 MED ORDER — SODIUM CHLORIDE 0.9 % IV SOLN: INTRAVENOUS | Status: DC

## 2019-09-30 MED ORDER — SODIUM CHLORIDE 0.9 % IV BOLUS
1000.0000 mL | Freq: Once | INTRAVENOUS | Status: AC
  Administered 2019-09-30: 1000 mL via INTRAVENOUS

## 2019-09-30 MED ORDER — PANTOPRAZOLE SODIUM 40 MG PO TBEC
40.0000 mg | DELAYED_RELEASE_TABLET | Freq: Two times a day (BID) | ORAL | Status: DC
  Administered 2019-10-01 – 2019-10-03 (×6): 40 mg via ORAL
  Filled 2019-09-30 (×6): qty 1

## 2019-09-30 NOTE — ED Provider Notes (Signed)
  Ultrasound ED Peripheral IV (Provider)  Date/Time: 09/30/2019 2:37 PM Performed by: Anselm Pancoast, PA-C Authorized by: Anselm Pancoast, PA-C   Procedure details:    Indications: multiple failed IV attempts and poor IV access     Skin Prep: chlorhexidine gluconate     Location:  Left AC   Angiocath:  20 G   Bedside Ultrasound Guided: Yes     Images: archived     Patient tolerated procedure without complications: Yes     Dressing applied: Yes   Comments:     Positive flash.  Advanced and flushed without pain, swelling, or other signs of infiltration. One previous unsuccessful attempt in right [facility].      Anselm Pancoast, PA-C 09/30/19 1443    Benjiman Core, MD 09/30/19 1459

## 2019-09-30 NOTE — ED Notes (Signed)
RN attempted to call report; RN to call back; 

## 2019-09-30 NOTE — ED Notes (Signed)
MD notified of patient's HR being 132. Will await further orders. Pt resting with eyes closed with equal chest rise and fall. Bed in low position with side rails up. Call bell within reach.

## 2019-09-30 NOTE — ED Triage Notes (Addendum)
Pt arrives via [facility] from home with c/o seizures, reports almost daily seizures, hx of epilepsy, takes keppra and lamictal, reports she has been taking her meds. Pt reported incontinence and has bite marks to tongue. Pt a/ox4 upon fire and ems arrival, no noted post ictal state. CBG 542 with ems

## 2019-09-30 NOTE — ED Notes (Signed)
RN has spoken with receiving RN on floor; Patient  HR is elevated; RN attempted to message admitting but no  Response as of yet; receiving floor will contact [facility] and asked for update CBG; RN will wait for call back-Monique,RN

## 2019-09-30 NOTE — ED Notes (Signed)
RN spoke with receiving RN and states patient ok to go up-Monique,RN

## 2019-09-30 NOTE — ED Notes (Signed)
Pt found to be drinking flavored water. Educated patient on npo status due to excessive vomiting. Pt stated understanding. Pt to CT at this time. Pt dry heaving and rocking back and forth on stretcher.

## 2019-09-30 NOTE — ED Notes (Signed)
Administered 0.5mg  of ordered Ativan per MD order. Pulled and wasted Ativan in Pyxis.

## 2019-09-30 NOTE — Progress Notes (Signed)
VAST consulted to obtain IV access. Upon arrival at pt's bedside, ER provider had obtained USGIV.

## 2019-09-30 NOTE — H&P (Signed)
History and Physical   Nancy [name] WUJ:[id number] DOB: 07-27-2000 DOA: 09/30/2019  Referring MD/NP/PA: Dr. Renaye Rakers  PCP: Fleet Contras, MD   Outpatient Specialists: None  Patient coming from: Home  Chief Complaint: Intractable nausea vomiting  HPI: Nancy [name] is a 19 y.o. female with medical history significant of recurrent intractable nausea and vomiting, suspected gastroparesis, chronic pain syndrome, diastolic CHF, morbid obesity, CAD seizures, insulin-dependent diabetes and dyspepsia who presented to the ER with intractable nausea vomiting and swelling in her mouth.  Patient has had this over over again.  She was evaluated in the ER.  Found to be mildly dehydrated.  Also found to have left dental abscess.  She apparently saw a dentist before but has been unable to see one lately.  She was not having any fever or chills.  No diarrhea no hematemesis no bright red blood per rectum.  Patient has been tried on symptomatic treatment in the ER and symptoms have persisted.  She is being admitted for observation and control of symptoms prior to discharge home..  ED Course: Temperature 98.9 blood pressure 218/20 5 pulse 140 respiratory 38 oxygen sats 99% room air.  Chemistry showed blood sugar of 367 otherwise within normal CBC all within normal.  Pregnancy test is negative.  CT head without contrast shows stable findings.  CT maxillofacial showed extensive dental caries with abscess.  Patient being admitted for further evaluation.  Review of Systems: As per HPI otherwise 10 point review of systems negative.    Past Medical History:  Diagnosis Date  . Acanthosis nigricans   . Anxiety   . CHF (congestive heart failure) (HCC)   . Chronic lower back pain   . Depression   . DKA, type 1 (HCC) 09/13/2018  . Dyspepsia   . Obesity   . Ovarian cyst    pt is not aware of this hx (11/24/2017)  . 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 Premature baby   . Seizures (HCC)   . Type II diabetes mellitus  (HCC)    insulin dependant    Past Surgical History:  Procedure Laterality Date  . ABDOMINAL HERNIA REPAIR     "I was a baby"  . BIOPSY  10/12/2018   Procedure: BIOPSY;  Surgeon: Lynann Bologna, MD;  Location: [facility] ENDOSCOPY;  Service: Endoscopy;;  . ESOPHAGOGASTRODUODENOSCOPY (EGD) WITH PROPOFOL N/A 10/12/2018   Procedure: ESOPHAGOGASTRODUODENOSCOPY (EGD) WITH PROPOFOL;  Surgeon: Lynann Bologna, MD;  Location: [facility] ENDOSCOPY;  Service: Endoscopy;  Laterality: N/A;  . HERNIA REPAIR    . LEFT HEART CATH AND CORONARY ANGIOGRAPHY N/A 10/13/2018   Procedure: LEFT HEART CATH AND CORONARY ANGIOGRAPHY;  Surgeon: Kathleene Hazel, MD;  Location: [facility] INVASIVE CV LAB;  Service: Cardiovascular;  Laterality: N/A;  . TONSILLECTOMY AND ADENOIDECTOMY    . WISDOM TOOTH EXTRACTION  2017     reports that she has never smoked. She has never used smokeless tobacco. She reports that she does not drink alcohol and does not use drugs.  Allergies  Allergen Reactions  . Ibuprofen Other (See Comments)    GI MD said to not take this anymore  . Tomato   . Oatmeal Rash    Family History  Problem Relation Age of Onset  . Diabetes Mother   . Hypertension Mother   . Obesity Mother   . Asthma Mother   . Allergic rhinitis Mother   . Eczema Mother   . Cervical cancer Mother   . Diabetes Father   . Hypertension Father   .  Obesity Father   . Hyperlipidemia Father   . Hypertension Paternal Aunt   . Hypertension Maternal Grandfather   . Colon cancer Maternal Grandfather   . Diabetes Paternal Grandmother   . Obesity Paternal Grandmother   . Diabetes Paternal Grandfather   . Obesity Paternal Grandfather   . Angioedema Neg Hx   . Immunodeficiency Neg Hx   . Urticaria Neg Hx   . Stomach cancer Neg Hx   . Esophageal cancer Neg Hx      Prior to Admission medications   Medication Sig Start Date End Date Taking? Authorizing Provider  ARIPiprazole (ABILIFY) 10 MG tablet Take 20 mg by mouth in the morning and  at bedtime.    [provider]  atorvastatin (LIPITOR) 20 MG tablet Take 1 tablet (20 mg total) by mouth daily at 6 PM. Patient taking differently: Take 20 mg by mouth in the morning and at bedtime.  10/05/18   Storm Frisk, MD  busPIRone (BUSPAR) 10 MG tablet Take 10 mg by mouth 3 (three) times daily.  11/19/18   [provider]  Caraway Oil-Levomenthol (FDGARD) 25-20.75 MG CAPS Take 2 capsules by mouth as needed. Take as directed as needed Patient taking differently: Take 2 capsules by mouth in the morning and at bedtime.  03/21/19   Armbruster, Willaim Rayas, MD  Diclofenac Sodium (VOLTAREN EX) Apply 1 application topically 2 (two) times daily as needed (chest pains).    [provider]  famotidine (PEPCID) 40 MG tablet Take 40 mg by mouth 2 (two) times daily.    [provider]  insulin aspart (NOVOLOG FLEXPEN) 100 UNIT/ML FlexPen Inject 15 Units into the skin daily.     [provider]  lamoTRIgine (LAMICTAL) 25 MG tablet Take 2 tablets (50 mg total) by mouth 2 (two) times daily. For mood stabilization Patient taking differently: Take 50 mg by mouth in the morning, at noon, and at bedtime. For mood stabilization 11/11/18   Nwoko, Nicole Kindred I, NP  LANTUS SOLOSTAR 100 UNIT/ML Solostar Pen Inject 70 Units into the skin at bedtime.  07/05/19   [provider]  lidocaine (XYLOCAINE) 2 % solution 3 mLs as needed for mouth pain.    [provider]  lubiprostone (AMITIZA) 24 MCG capsule Take 1 capsule (24 mcg total) by mouth 2 (two) times daily with a meal. Patient taking differently: Take 24 mcg by mouth in the morning, at noon, and at bedtime.  10/27/18   Esterwood, Amy S, PA-C  mirtazapine (REMERON) 7.5 MG tablet Take 1 tablet (7.5 mg total) by mouth at bedtime. For depression/sleep 11/11/18   Armandina Stammer I, NP  Multiple Vitamin (MULTIVITAMIN WITH MINERALS) TABS tablet Take 1 tablet by mouth daily. 09/18/18   Clapacs, Jackquline Denmark, MD  pantoprazole  (PROTONIX) 40 MG tablet Take 1 tablet (40 mg total) by mouth 2 (two) times daily. For acid reflux 01/29/19   Jae Dire, MD  polyethylene glycol (MIRALAX / GLYCOLAX) 17 g packet Take 17 g by mouth daily. Patient taking differently: Take 17 g by mouth daily as needed for moderate constipation.  12/24/18   Swayze, Ava, DO  potassium chloride SA (KLOR-CON) 20 MEQ tablet Take 40 mEq by mouth 2 (two) times daily.    [provider]  prochlorperazine (COMPAZINE) 25 MG suppository Place 1 suppository (25 mg total) rectally every 12 (twelve) hours as needed for nausea or vomiting. 12/05/18   Melene Plan, DO  promethazine (PHENERGAN) 25 MG suppository Place 25 mg  rectally every 8 (eight) hours as needed for nausea or vomiting.    [provider]  propranolol (INDERAL) 20 MG tablet Take 1 tablet (20 mg total) by mouth 2 (two) times daily. For anxiety/HTN Patient taking differently: Take 20 mg by mouth 3 (three) times daily. For anxiety/HTN 12/23/18   Swayze, Ava, DO  QUEtiapine (SEROQUEL) 100 MG tablet Take 300 mg by mouth at bedtime. 07/05/19   [provider]  QUEtiapine (SEROQUEL) 50 MG tablet Take 50 mg by mouth 2 (two) times daily.    [provider]  sucralfate (CARAFATE) 1 GM/10ML suspension Take 1 g by mouth 2 (two) times daily.  07/05/19   [provider]  topiramate (TOPAMAX) 25 MG tablet Take 1 tablet (25 mg total) by mouth 2 (two) times daily. For seizure activities 11/11/18   Armandina Stammer I, NP    Physical Exam: Vitals:   09/30/19 1550 09/30/19 1650 09/30/19 1730 09/30/19 2000  BP: (!) 144/82 (!) 152/71 (!) 149/97 (!) 146/75  Pulse: (!) 119 (!) 131 (!) 113 (!) 115  Resp: (!) 24 (!) 24 (!) 38 (!) 31  Temp:      TempSrc:      SpO2: 100% 100% 100% 99%      Constitutional: Obese, very anxious, no distress Vitals:   09/30/19 1550 09/30/19 1650 09/30/19 1730 09/30/19 2000  BP: (!) 144/82 (!) 152/71 (!) 149/97 (!) 146/75  Pulse: (!) 119 (!) 131  (!) 113 (!) 115  Resp: (!) 24 (!) 24 (!) 38 (!) 31  Temp:      TempSrc:      SpO2: 100% 100% 100% 99%   Eyes: PERRL, lids and conjunctivae normal ENMT: Mucous membranes are dry. Posterior pharynx clear of any exudate or lesions.Normal dentition.  Neck: normal, supple, no masses, no thyromegaly Respiratory: clear to auscultation bilaterally, no wheezing, no crackles. Normal respiratory effort. No accessory muscle use.  Cardiovascular: Sinus tachycardia, no murmurs / rubs / gallops. No extremity edema. 2+ pedal pulses. No carotid bruits.  Abdomen: no tenderness, no masses palpated. No hepatosplenomegaly. Bowel sounds positive.  Musculoskeletal: no clubbing / cyanosis. No joint deformity upper and lower extremities. Good ROM, no contractures. Normal muscle tone.  Skin: no rashes, lesions, ulcers. No induration Neurologic: CN 2-12 grossly intact. Sensation intact, DTR normal. Strength 5/5 in all 4.  Psychiatric: Normal judgment and insight. Alert and oriented x 3. Normal mood.     Labs on Admission: I have personally reviewed following labs and imaging studies  CBC: Recent Labs  Lab 09/30/19 1120  WBC 9.2  HGB 11.6*  HCT 37.8  MCV 80.4  PLT 303   Basic Metabolic Panel: Recent Labs  Lab 09/30/19 1120  NA 137  K 4.3  CL 100  CO2 28  GLUCOSE 367*  BUN 5*  CREATININE 0.62  CALCIUM 9.1   GFR: CrCl cannot be calculated (Unknown ideal weight.). Liver Function Tests: No results for input(s): AST, ALT, ALKPHOS, BILITOT, PROT, ALBUMIN in the last 168 hours. No results for input(s): LIPASE, AMYLASE in the last 168 hours. No results for input(s): AMMONIA in the last 168 hours. Coagulation Profile: No results for input(s): INR, PROTIME in the last 168 hours. Cardiac Enzymes: No results for input(s): CKTOTAL, CKMB, CKMBINDEX, TROPONINI in the last 168 hours. BNP (last 3 results) No results for input(s): PROBNP in the last 8760 hours. HbA1C: No results for input(s): HGBA1C in  the last 72 hours. CBG: Recent Labs  Lab 09/30/19 1353  GLUCAP  383*   Lipid Profile: No results for input(s): CHOL, HDL, LDLCALC, TRIG, CHOLHDL, LDLDIRECT in the last 72 hours. Thyroid Function Tests: No results for input(s): TSH, T4TOTAL, FREET4, T3FREE, THYROIDAB in the last 72 hours. Anemia Panel: No results for input(s): VITAMINB12, FOLATE, FERRITIN, TIBC, IRON, RETICCTPCT in the last 72 hours. Urine analysis:    Component Value Date/Time   COLORURINE YELLOW 08/23/2019 0942   APPEARANCEUR HAZY (A) 08/23/2019 0942   LABSPEC 1.038 (H) 08/23/2019 0942   PHURINE 6.0 08/23/2019 0942   GLUCOSEU >=500 (A) 08/23/2019 0942   HGBUR SMALL (A) 08/23/2019 0942   BILIRUBINUR NEGATIVE 08/23/2019 0942   BILIRUBINUR negative 07/13/2018 1651   KETONESUR 80 (A) 08/23/2019 0942   PROTEINUR NEGATIVE 08/23/2019 0942   UROBILINOGEN 0.2 07/13/2018 1651   UROBILINOGEN 1.0 07/09/2017 1324   NITRITE NEGATIVE 08/23/2019 0942   LEUKOCYTESUR NEGATIVE 08/23/2019 0942   Sepsis Labs: @LABRCNTIP (procalcitonin:4,lacticidven:4) )No results found for this or any previous visit (from the past 240 hour(s)).   Radiological Exams on Admission: CT Head Wo Contrast  Result Date: 09/30/2019 CLINICAL DATA:  19 year old female status post multiple recent seizures. Has been biting tongue. Pain and swelling around the jaw. EXAM: CT HEAD WITHOUT CONTRAST TECHNIQUE: Contiguous axial images were obtained from the base of the skull through the vertex without intravenous contrast. COMPARISON:  Brain MRI 12/01/2018. Head CT 01/15/2019. FINDINGS: Brain: Stable cerebral volume. 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 No midline shift, ventriculomegaly, mass effect, evidence of mass lesion, intracranial hemorrhage or evidence of cortically based acute infarction. Gray-white matter differentiation is within normal limits throughout the brain. Vascular: No  suspicious intracranial vascular hyperdensity. Skull: Negative. Sinuses/Orbits: Visualized paranasal sinuses and mastoids are stable and well pneumatized. Other: Visualized orbits and scalp soft tissues are within normal limits. IMPRESSION: Stable and negative noncontrast CT appearance of the brain. Electronically Signed   By: 03/15/2019 M.D.   On: 09/30/2019 16:38   CT Maxillofacial W Contrast  Result Date: 09/30/2019 CLINICAL DATA:  19 year old female status post multiple recent seizures. Has been biting tongue. Pain and swelling around the jaw. EXAM: CT MAXILLOFACIAL WITH CONTRAST TECHNIQUE: Multidetector CT imaging of the maxillofacial structures was performed with intravenous contrast. Multiplanar CT image reconstructions were also generated. CONTRAST:  63mL OMNIPAQUE IOHEXOL 300 MG/ML  SOLN COMPARISON:  Head CT today reported separately FINDINGS: Osseous: Mild motion artifact. Multifocal carious dentition, including the anterior maxillary and mandible dentition. Periapical lucency at the left mandible anterior molar is associated with some lateral cortical breakthrough (series 4, image 34) at the level of asymmetric left facial soft tissue swelling and stranding. See additional soft tissue details below. No mandible or facial fracture identified. Visible calvarium and cervical vertebrae appear intact. Orbits: Orbital walls appear intact.  Negative orbits soft tissues. Sinuses: Somewhat hypoplastic, but clear throughout. Soft tissues: Asymmetric soft tissue swelling and stranding around the body of the left mandible, epicenter at the anterior mandible molar as detailed above. And there is evidence of a small 10 mm subperiosteal abscess seen on coronal series 7, image 40. But no other soft tissue fluid is identified. No soft tissue gas. Mild left lower masticator space involvement. The sublingual space is spared. The submandibular, parotid, parapharyngeal, and retropharyngeal spaces are spared. Negative visible  thyroid, larynx and pharynx. Reactive level 1 lymph nodes measure up to 14 mm short axis (left level 1 B station). No cystic or necrotic nodes. The visible major vascular structures in the neck  and at the skull base are patent. Limited intracranial: Negative, see also head CT today reported separately. IMPRESSION: 1. Extensive dental caries with odontogenic Cellulitis around the body of the left mandible. Small 10 mm subperiosteal abscess overlying the carious left anterior mandible molar. Reactive level 1 lymphadenopathy. 2. No other acute finding.  Mildly degraded by motion. Electronically Signed   By: Odessa Fleming M.D.   On: 09/30/2019 16:44   DG Abd 2 Views  Result Date: 09/30/2019 CLINICAL DATA:  NAUSEA AND VOMITING FOR 4 DAYS EXAM: X-RAY ABDOMEN 2 VIEWS COMPARISON:  09/04/2019 FINDINGS: Supine and upright frontal views of the abdomen and pelvis demonstrate a nonobstructive bowel gas pattern. There is a paucity of bowel gas. Minimal stool within the distal colon. No masses or abnormal calcifications. No free gas in the greater peritoneal sac. IMPRESSION: 1. No evidence of bowel obstruction or ileus.  Paucity of bowel gas. Electronically Signed   By: Sharlet Salina M.D.   On: 09/30/2019 15:37      Assessment/Plan Principal Problem:   Intractable nausea and vomiting Active Problems:   Hypertension   Chronic abdominal pain   Conversion disorder with attacks or seizures, acute episode, with psychological stressor   Pseudoseizures   Dehydration     #1 intractable nausea with vomiting: Patient will be admitted for symptomatic management.  Continue with combination of Zofran Phenergan and Haldol.  #2 malignant hypertension: May be contributing to her nausea with vomiting.  Control using home regimen and IV medications.  #3 diabetes: Sliding scale insulin.  #4 dental abscess: Initial IV antibiotics.  Transition to oral antibiotics.  May need dental surgery in or outpatient depending on  availability.  We will continue clindamycin IV and transition to oral clindamycin.  #5 chronic pain syndrome: Continue home regimen.  Give some morphine due to pain.  #6 anxiety disorder with pseudoseizures: Patient will be discharged as soon as her nausea vomiting resolves.  Continue her home regimen including Seroquel.  Also Topamax.   DVT prophylaxis: Lovenox  Code Status: Full  Family Communication: No family at bedside  Disposition Plan: Home  Consults called: none  Admission status: Observation   Severity of Illness: The appropriate patient status for this patient is OBSERVATION. Observation status is judged to be reasonable and necessary in order to provide the required intensity of service to ensure the patient's safety. The patient's presenting symptoms, physical exam findings, and initial radiographic and laboratory data in the context of their medical condition is felt to place them at decreased risk for further clinical deterioration. Furthermore, it is anticipated that the patient will be medically stable for discharge from the hospital within 2 midnights of admission. The following factors support the patient status of observation.   " The patient's presenting symptoms include NV abdominal pain. " The physical exam findings include None but left Jaw swelling and tenderness. " The initial radiographic and laboratory data are Dental abscess.     Lonia Blood MD [facility] Pager [phone]  If 7PM-7AM, please contact night-coverage [email or web address] Password [facility]  09/30/2019, 9:07 PM

## 2019-09-30 NOTE — ED Notes (Signed)
Patient transported to CT 

## 2019-09-30 NOTE — ED Provider Notes (Signed)
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  Left jaw pain from a fall.  BS 367, no anion gap.  Pending CT scan of head and jaw.  Received reglan, benadryl, IV fluids currently.  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 Melynda Ripple multiple times for seizure-like episodes.  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  She has been dealing with abdominal pain at least 4-5 times a week for the past couple years.  She has a normal exam from a neuro standpoint.  She also states that she has been on her antiepileptic medications for at least 3 years and has had no difficulties or side effects with these medications."  Per last neurology evaluate, patient had documented seizure-like activity during an EEG which showed no epileptiform activity.  She was advised to continue on her AED, which is lamictal currently.  Plan for today to reassess after CT and for improved PO status after IV fluids and antiemetic medications.  7 pm reassessment - patient was able to sleep for 20 minutes after ativan and haldol, but is now awake, complaining she feels restless, complaining primarily of pain in her mouth (which I suspect is her odontogenic infection seen on CT).   I think she will need admission for pain control.  She remains tachycardic which I think is secondary to pain, she is restless in the bed.  Her abdominal exam has no guarding to suspect a peritonitis or perforation.  Her abdominal xray did not show sign of free air or obstruction.  She is hungry now, which lowers my suspicion for an obstruction.  I'd keep her NPO today, manage her pain further with IV dilaudid, and admit  her to the hospitalist.  She'll need antibiotics for her mouth infection as well.  710 pm - I spoke to Dr Mikeal Hawthorne the admitting hospitalist who recommended a CT scan of the abdomen and a UDS.   Terald Sleeper, MD 10/01/19 [phone]

## 2019-09-30 NOTE — ED Notes (Signed)
IV team at bedside placing US guided IV

## 2019-09-30 NOTE — ED Provider Notes (Signed)
[facility] EMERGENCY DEPARTMENT Provider Note   CSN: [id number] Arrival date & time: 09/30/19  1057     History Chief Complaint  Patient presents with  . Seizures    Nancy [name] is a 19 y.o. female.  HPI Patient presents with multiple seizures.  States that she is having multiple seizures for multiple days.  States she is just on Lamictal.  States she is having few seizures today.  States she has bit her tongue.  States she did hit her head and has pain in her left jaw.  States she hurt her jaw.  Also has been vomiting.  States she is having trouble keeping anything down.  History of vomiting with sounds like an unclear cause.  States she had to have be admitted to the hospital for 13 days to get her bowels moving again.  Also states she has had negative gastric emptying studies.  States her neurologist was at [facility] but is retiring.    Past Medical History:  Diagnosis Date  . Acanthosis nigricans   . Anxiety   . CHF (congestive heart failure) (HCC)   . Chronic lower back pain   . Depression   . DKA, type 1 (HCC) 09/13/2018  . Dyspepsia   . Obesity   . Ovarian cyst    pt is not aware of this hx (11/24/2017)  . 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 Premature baby   . Seizures (HCC)   . Type II diabetes mellitus (HCC)    insulin dependant    Patient Active Problem List   Diagnosis Date Noted  . Cellulitis   . DKA (diabetic ketoacidoses) (HCC) 01/27/2019  . Leukocytosis 12/19/2018  . Dehydration   . Pseudoseizures   . Type 1 diabetes mellitus with ketoacidosis without coma (HCC) 11/28/2018  . Hyperglycemia due to diabetes mellitus (HCC) 11/27/2018  . 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  . MDD (major depressive disorder), recurrent episode, severe (HCC) 11/06/2018  . Nonspecific abnormal electrocardiogram (ECG) (EKG)   . Chest pain of uncertain etiology   . Hypertensive urgency 10/08/2018  . Conversion  disorder with attacks or seizures, acute episode, with psychological stressor 09/16/2018  . MDD (major depressive disorder), recurrent severe, without psychosis (HCC)   . AKI (acute kidney injury) (HCC) 08/04/2018  . Seizure-like activity (HCC) 08/03/2018  . Depression 07/25/2018  . Syncope 01/30/2018  . Orthostatic hypotension 01/24/2018  . Tachycardia 12/28/2017  . Chronic abdominal pain 12/24/2017  . Chest pain 12/19/2017  . Nausea and vomiting 08/21/2017  . Generalized abdominal pain 08/21/2017  . Non compliance with medical treatment 01/27/2012  . Adjustment disorder 09/16/2011  . Acanthosis nigricans   . Goiter   . Obesity 06/14/2010  . Hypertension 06/14/2010    Past Surgical History:  Procedure Laterality Date  . ABDOMINAL HERNIA REPAIR     "I was a baby"  . BIOPSY  10/12/2018   Procedure: BIOPSY;  Surgeon: Lynann Bologna, MD;  Location: [facility] ENDOSCOPY;  Service: Endoscopy;;  . ESOPHAGOGASTRODUODENOSCOPY (EGD) WITH PROPOFOL N/A 10/12/2018   Procedure: ESOPHAGOGASTRODUODENOSCOPY (EGD) WITH PROPOFOL;  Surgeon: Lynann Bologna, MD;  Location: [facility] ENDOSCOPY;  Service: Endoscopy;  Laterality: N/A;  . HERNIA REPAIR    . LEFT HEART CATH AND CORONARY ANGIOGRAPHY N/A 10/13/2018   Procedure: LEFT HEART CATH AND CORONARY ANGIOGRAPHY;  Surgeon: Kathleene Hazel, MD;  Location: [facility] INVASIVE CV LAB;  Service: Cardiovascular;  Laterality: N/A;  . TONSILLECTOMY AND ADENOIDECTOMY    . WISDOM  TOOTH EXTRACTION  2017     OB History   No obstetric history on file.     Family History  Problem Relation Age of Onset  . Diabetes Mother   . Hypertension Mother   . Obesity Mother   . Asthma Mother   . Allergic rhinitis Mother   . Eczema Mother   . Cervical cancer Mother   . Diabetes Father   . Hypertension Father   . Obesity Father   . Hyperlipidemia Father   . Hypertension Paternal Aunt   . Hypertension Maternal Grandfather   . Colon cancer Maternal Grandfather   . Diabetes  Paternal Grandmother   . Obesity Paternal Grandmother   . Diabetes Paternal Grandfather   . Obesity Paternal Grandfather   . Angioedema Neg Hx   . Immunodeficiency Neg Hx   . Urticaria Neg Hx   . Stomach cancer Neg Hx   . Esophageal cancer Neg Hx     Social History   Tobacco Use  . Smoking status: Never Smoker  . Smokeless tobacco: Never Used  Vaping Use  . Vaping Use: Never used  Substance Use Topics  . Alcohol use: No    Alcohol/week: 0.0 standard drinks  . Drug use: No    Home Medications Prior to Admission medications   Medication Sig Start Date End Date Taking? Authorizing Provider  ARIPiprazole (ABILIFY) 10 MG tablet Take 20 mg by mouth in the morning and at bedtime.    [provider]  atorvastatin (LIPITOR) 20 MG tablet Take 1 tablet (20 mg total) by mouth daily at 6 PM. Patient taking differently: Take 20 mg by mouth in the morning and at bedtime.  10/05/18   Storm Frisk, MD  busPIRone (BUSPAR) 10 MG tablet Take 10 mg by mouth 3 (three) times daily.  11/19/18   [provider]  Caraway Oil-Levomenthol (FDGARD) 25-20.75 MG CAPS Take 2 capsules by mouth as needed. Take as directed as needed Patient taking differently: Take 2 capsules by mouth in the morning and at bedtime.  03/21/19   Armbruster, Willaim Rayas, MD  Diclofenac Sodium (VOLTAREN EX) Apply 1 application topically 2 (two) times daily as needed (chest pains).    [provider]  famotidine (PEPCID) 40 MG tablet Take 40 mg by mouth 2 (two) times daily.    [provider]  insulin aspart (NOVOLOG FLEXPEN) 100 UNIT/ML FlexPen Inject 15 Units into the skin daily.     [provider]  lamoTRIgine (LAMICTAL) 25 MG tablet Take 2 tablets (50 mg total) by mouth 2 (two) times daily. For mood stabilization Patient taking differently: Take 50 mg by mouth in the morning, at noon, and at bedtime. For mood stabilization 11/11/18   Nwoko, Nicole Kindred I, NP  LANTUS SOLOSTAR 100 UNIT/ML  Solostar Pen Inject 70 Units into the skin at bedtime.  07/05/19   [provider]  lidocaine (XYLOCAINE) 2 % solution 3 mLs as needed for mouth pain.    [provider]  lubiprostone (AMITIZA) 24 MCG capsule Take 1 capsule (24 mcg total) by mouth 2 (two) times daily with a meal. Patient taking differently: Take 24 mcg by mouth in the morning, at noon, and at bedtime.  10/27/18   Esterwood, Amy S, PA-C  mirtazapine (REMERON) 7.5 MG tablet Take 1 tablet (7.5 mg total) by mouth at bedtime. For depression/sleep 11/11/18   Armandina Stammer I, NP  Multiple Vitamin (MULTIVITAMIN WITH MINERALS) TABS tablet Take 1 tablet by mouth daily. 09/18/18  Clapacs, Jackquline Denmark, MD  pantoprazole (PROTONIX) 40 MG tablet Take 1 tablet (40 mg total) by mouth 2 (two) times daily. For acid reflux 01/29/19   Jae Dire, MD  polyethylene glycol (MIRALAX / GLYCOLAX) 17 g packet Take 17 g by mouth daily. Patient taking differently: Take 17 g by mouth daily as needed for moderate constipation.  12/24/18   Swayze, Ava, DO  potassium chloride SA (KLOR-CON) 20 MEQ tablet Take 40 mEq by mouth 2 (two) times daily.    [provider]  prochlorperazine (COMPAZINE) 25 MG suppository Place 1 suppository (25 mg total) rectally every 12 (twelve) hours as needed for nausea or vomiting. 12/05/18   Melene Plan, DO  promethazine (PHENERGAN) 25 MG suppository Place 25 mg rectally every 8 (eight) hours as needed for nausea or vomiting.    [provider]  propranolol (INDERAL) 20 MG tablet Take 1 tablet (20 mg total) by mouth 2 (two) times daily. For anxiety/HTN Patient taking differently: Take 20 mg by mouth 3 (three) times daily. For anxiety/HTN 12/23/18   Swayze, Ava, DO  QUEtiapine (SEROQUEL) 100 MG tablet Take 300 mg by mouth at bedtime. 07/05/19   [provider]  QUEtiapine (SEROQUEL) 50 MG tablet Take 50 mg by mouth 2 (two) times daily.    [provider]  sucralfate (CARAFATE) 1 GM/10ML  suspension Take 1 g by mouth 2 (two) times daily.  07/05/19   [provider]  topiramate (TOPAMAX) 25 MG tablet Take 1 tablet (25 mg total) by mouth 2 (two) times daily. For seizure activities 11/11/18   Armandina Stammer I, NP    Allergies    Ibuprofen, Tomato, and Oatmeal  Review of Systems   Review of Systems  Constitutional: Positive for appetite change. Negative for fever.  HENT:       Jaw pain  Respiratory: Negative for shortness of breath.   Cardiovascular: Negative for chest pain.  Gastrointestinal: Positive for abdominal pain, nausea and vomiting.  Genitourinary: Negative for flank pain.  Musculoskeletal: Positive for back pain.  Skin: Negative for rash.  Neurological: Positive for seizures.  Psychiatric/Behavioral: Negative for confusion.    Physical Exam Updated Vital Signs BP (!) 210/160 (BP Location: Right Arm) Comment: Charge nurse notified  Pulse (!) 140   Temp 97.9 F (36.6 C) (Oral)   Resp (!) 28   SpO2 100%   Physical Exam Vitals and nursing note reviewed.  Constitutional:      Appearance: She is obese.  HENT:     Head:     Comments: Tenderness and swelling on left lower jaw.  Somewhat poor dentition.  Does appear to have a fullness on the left    Mouth/Throat:     Mouth: Mucous membranes are moist.  Eyes:     Pupils: Pupils are equal, round, and reactive to light.  Cardiovascular:     Rate and Rhythm: Regular rhythm.  Pulmonary:     Breath sounds: No rhonchi.  Chest:     Chest wall: No tenderness.  Abdominal:     Tenderness: There is abdominal tenderness.     Comments: Tenderness in upper abdomen.  Loudly vomiting.  Musculoskeletal:        General: No tenderness.     Cervical back: Neck supple.  Skin:    General: Skin is warm.     Capillary Refill: Capillary refill takes less than 2 seconds.  Neurological:     Mental Status: She is alert and oriented to person, place, and  time.     ED Results / Procedures / Treatments   Labs (all  labs ordered are listed, but only abnormal results are displayed) Labs Reviewed  CBC - Abnormal; Notable for the following components:      Result Value   Hemoglobin 11.6 (*)    MCH 24.7 (*)    All other components within normal limits  BASIC METABOLIC PANEL - Abnormal; Notable for the following components:   Glucose, Bld 367 (*)    BUN 5 (*)    All other components within normal limits  I-STAT BETA HCG BLOOD, ED ([facility], [facility], [facility] ONLY)  CBG MONITORING, ED    EKG EKG Interpretation  Date/Time:  Friday September 30 2019 10:54:04 EDT Ventricular Rate:  114 PR Interval:  124 QRS Duration: 80 QT Interval:  332 QTC Calculation: 457 R Axis:   74 Text Interpretation: Sinus tachycardia Otherwise normal ECG Confirmed by Benjiman Core [id number]) on 09/30/2019 1:25:23 PM   Radiology No results found.  Procedures Procedures (including critical care time)  Medications Ordered in ED Medications  metoCLOPramide (REGLAN) injection 10 mg (has no administration in time range)  sodium chloride 0.9 % bolus 1,000 mL (has no administration in time range)    ED Course  I have reviewed the triage vital signs and the nursing notes.  Pertinent labs & imaging results that were available during my care of the patient were reviewed by me and considered in my medical decision making (see chart for details).    MDM Rules/Calculators/A&P                          Patient presents with nausea vomiting and abdominal pain.  Some hyperglycemia.  History of chronic abdominal pain chronic vomiting.  However also states she is had multiple seizures.  Reviewing records possibility of pseudoseizures and conversion disorder but also has been on Keppra and Lamictal.  States her neurologist was low Nechama Guard but at see notes from [facility] neurology.  Has swelling of the left side of her face after trauma.  Also states she has bad teeth here.  Will get CT scan of head and jaw to further evaluate this.  Fluid bolus Reglan  and Benadryl given.  Care will be turned over to oncoming provider. Final Clinical Impression(s) / ED Diagnoses Final diagnoses:  None    Rx / DC Orders ED Discharge Orders    None       Benjiman Core, MD 09/30/19 1447

## 2019-09-30 NOTE — ED Notes (Signed)
Pt found to be thrashing and rolling in the bed. Upon assessment patient stated, "I am in so much pain. I cannot stop vomiting and my jaw is killing me. I need pain medicine now." Emesis bag found to be empty at bedside. Notified MD in regards to patient's condition.

## 2019-10-01 DIAGNOSIS — F329 Major depressive disorder, single episode, unspecified: Secondary | ICD-10-CM | POA: Diagnosis present

## 2019-10-01 DIAGNOSIS — G894 Chronic pain syndrome: Secondary | ICD-10-CM | POA: Diagnosis present

## 2019-10-01 DIAGNOSIS — R109 Unspecified abdominal pain: Secondary | ICD-10-CM | POA: Diagnosis present

## 2019-10-01 DIAGNOSIS — R1115 Cyclical vomiting syndrome unrelated to migraine: Secondary | ICD-10-CM | POA: Diagnosis present

## 2019-10-01 DIAGNOSIS — K047 Periapical abscess without sinus: Secondary | ICD-10-CM | POA: Diagnosis present

## 2019-10-01 DIAGNOSIS — F445 Conversion disorder with seizures or convulsions: Secondary | ICD-10-CM | POA: Diagnosis present

## 2019-10-01 DIAGNOSIS — F1290 Cannabis use, unspecified, uncomplicated: Secondary | ICD-10-CM | POA: Diagnosis present

## 2019-10-01 DIAGNOSIS — Z20822 Contact with and (suspected) exposure to covid-19: Secondary | ICD-10-CM | POA: Diagnosis present

## 2019-10-01 DIAGNOSIS — E860 Dehydration: Secondary | ICD-10-CM | POA: Diagnosis present

## 2019-10-01 DIAGNOSIS — R112 Nausea with vomiting, unspecified: Principal | ICD-10-CM

## 2019-10-01 DIAGNOSIS — I5032 Chronic diastolic (congestive) heart failure: Secondary | ICD-10-CM | POA: Diagnosis present

## 2019-10-01 DIAGNOSIS — Z825 Family history of asthma and other chronic lower respiratory diseases: Secondary | ICD-10-CM | POA: Diagnosis not present

## 2019-10-01 DIAGNOSIS — F419 Anxiety disorder, unspecified: Secondary | ICD-10-CM | POA: Diagnosis present

## 2019-10-01 DIAGNOSIS — Z83438 Family history of other disorder of lipoprotein metabolism and other lipidemia: Secondary | ICD-10-CM | POA: Diagnosis not present

## 2019-10-01 DIAGNOSIS — I10 Essential (primary) hypertension: Secondary | ICD-10-CM | POA: Diagnosis not present

## 2019-10-01 DIAGNOSIS — Z79899 Other long term (current) drug therapy: Secondary | ICD-10-CM | POA: Diagnosis not present

## 2019-10-01 DIAGNOSIS — Z833 Family history of diabetes mellitus: Secondary | ICD-10-CM | POA: Diagnosis not present

## 2019-10-01 DIAGNOSIS — Z6837 Body mass index (BMI) 37.0-37.9, adult: Secondary | ICD-10-CM | POA: Diagnosis not present

## 2019-10-01 DIAGNOSIS — Z8249 Family history of ischemic heart disease and other diseases of the circulatory system: Secondary | ICD-10-CM | POA: Diagnosis not present

## 2019-10-01 DIAGNOSIS — R1110 Vomiting, unspecified: Secondary | ICD-10-CM | POA: Diagnosis present

## 2019-10-01 DIAGNOSIS — Z8049 Family history of malignant neoplasm of other genital organs: Secondary | ICD-10-CM | POA: Diagnosis not present

## 2019-10-01 DIAGNOSIS — Z794 Long term (current) use of insulin: Secondary | ICD-10-CM | POA: Diagnosis not present

## 2019-10-01 DIAGNOSIS — I110 Hypertensive heart disease with heart failure: Secondary | ICD-10-CM | POA: Diagnosis present

## 2019-10-01 DIAGNOSIS — Z800 Family history of malignant neoplasm of digestive organs: Secondary | ICD-10-CM | POA: Diagnosis not present

## 2019-10-01 LAB — COMPREHENSIVE METABOLIC PANEL
ALT: 14 U/L (ref 0–44)
AST: 15 U/L (ref 15–41)
Albumin: 2.8 g/dL — ABNORMAL LOW (ref 3.5–5.0)
Alkaline Phosphatase: 98 U/L (ref 38–126)
Anion gap: 7 (ref 5–15)
BUN: 5 mg/dL — ABNORMAL LOW (ref 6–20)
CO2: 23 mmol/L (ref 22–32)
Calcium: 8 mg/dL — ABNORMAL LOW (ref 8.9–10.3)
Chloride: 105 mmol/L (ref 98–111)
Creatinine, Ser: 0.6 mg/dL (ref 0.44–1.00)
GFR calc Af Amer: >60 mL/min (ref >60)
GFR calc non Af Amer: >60 mL/min (ref >60)
Glucose, Bld: 254 mg/dL — ABNORMAL HIGH (ref 70–99)
Potassium: 3.1 mmol/L — ABNORMAL LOW (ref 3.5–5.1)
Sodium: 135 mmol/L (ref 135–145)
Total Bilirubin: <0.1 mg/dL — ABNORMAL LOW (ref 0.3–1.2)
Total Protein: 5.5 g/dL — ABNORMAL LOW (ref 6.5–8.1)

## 2019-10-01 LAB — URINALYSIS, ROUTINE W REFLEX MICROSCOPIC
Bacteria, UA: NONE SEEN
Bilirubin Urine: NEGATIVE
Glucose, UA: >=500 mg/dL — AB
Hgb urine dipstick: NEGATIVE
Ketones, ur: NEGATIVE mg/dL
Nitrite: NEGATIVE
Protein, ur: NEGATIVE mg/dL
Specific Gravity, Urine: 1.018 (ref 1.005–1.030)
pH: 6 (ref 5.0–8.0)

## 2019-10-01 LAB — CBC
HCT: 31.1 % — ABNORMAL LOW (ref 36.0–46.0)
Hemoglobin: 9.6 g/dL — ABNORMAL LOW (ref 12.0–15.0)
MCH: 24.7 pg — ABNORMAL LOW (ref 26.0–34.0)
MCHC: 30.9 g/dL (ref 30.0–36.0)
MCV: 80.2 fL (ref 80.0–100.0)
Platelets: 294 10*3/uL (ref 150–400)
RBC: 3.88 MIL/uL (ref 3.87–5.11)
RDW: 15.3 % (ref 11.5–15.5)
WBC: 14.2 10*3/uL — ABNORMAL HIGH (ref 4.0–10.5)
nRBC: 0 % (ref 0.0–0.2)

## 2019-10-01 LAB — RAPID URINE DRUG SCREEN, HOSP PERFORMED
Amphetamines: NOT DETECTED
Barbiturates: NOT DETECTED
Benzodiazepines: NOT DETECTED
Cocaine: NOT DETECTED
Opiates: POSITIVE — AB
Tetrahydrocannabinol: POSITIVE — AB

## 2019-10-01 LAB — TSH: TSH: 0.098 u[IU]/mL — ABNORMAL LOW (ref 0.350–4.500)

## 2019-10-01 LAB — GLUCOSE, CAPILLARY
Glucose-Capillary: 259 mg/dL — ABNORMAL HIGH (ref 70–99)
Glucose-Capillary: 108 mg/dL — ABNORMAL HIGH (ref 70–99)
Glucose-Capillary: 208 mg/dL — ABNORMAL HIGH (ref 70–99)
Glucose-Capillary: 163 mg/dL — ABNORMAL HIGH (ref 70–99)

## 2019-10-01 MED ORDER — BUSPIRONE HCL 5 MG PO TABS
10.0000 mg | ORAL_TABLET | Freq: Two times a day (BID) | ORAL | Status: DC
  Administered 2019-10-01 – 2019-10-03 (×6): 10 mg via ORAL
  Filled 2019-10-01 (×6): qty 2

## 2019-10-01 MED ORDER — QUETIAPINE FUMARATE 50 MG PO TABS
50.0000 mg | ORAL_TABLET | Freq: Two times a day (BID) | ORAL | Status: DC
  Administered 2019-10-01 – 2019-10-03 (×5): 50 mg via ORAL
  Filled 2019-10-01 (×5): qty 1

## 2019-10-01 MED ORDER — CAPSAICIN 0.025 % EX CREA
TOPICAL_CREAM | Freq: Two times a day (BID) | CUTANEOUS | Status: DC
  Administered 2019-10-02: 1 via TOPICAL
  Filled 2019-10-01: qty 60

## 2019-10-01 MED ORDER — POTASSIUM CHLORIDE CRYS ER 20 MEQ PO TBCR
40.0000 meq | EXTENDED_RELEASE_TABLET | Freq: Two times a day (BID) | ORAL | Status: AC
  Administered 2019-10-01 (×2): 40 meq via ORAL
  Filled 2019-10-01 (×2): qty 2

## 2019-10-01 MED ORDER — ARIPIPRAZOLE 5 MG PO TABS
10.0000 mg | ORAL_TABLET | Freq: Every day | ORAL | Status: DC
  Administered 2019-10-01 – 2019-10-03 (×4): 10 mg via ORAL
  Filled 2019-10-01 (×4): qty 2

## 2019-10-01 MED ORDER — MORPHINE SULFATE (PF) 2 MG/ML IV SOLN
2.0000 mg | INTRAVENOUS | Status: DC | PRN
  Administered 2019-10-01 – 2019-10-03 (×13): 2 mg via INTRAVENOUS
  Filled 2019-10-01 (×13): qty 1

## 2019-10-01 MED ORDER — MIRTAZAPINE 15 MG PO TABS
7.5000 mg | ORAL_TABLET | Freq: Every day | ORAL | Status: DC
  Administered 2019-10-01 – 2019-10-02 (×3): 7.5 mg via ORAL
  Filled 2019-10-01 (×3): qty 1

## 2019-10-01 MED ORDER — DICLOFENAC SODIUM 1 % EX GEL
2.0000 g | Freq: Two times a day (BID) | CUTANEOUS | Status: DC | PRN
  Administered 2019-10-01: 2 g via TOPICAL
  Filled 2019-10-01: qty 100

## 2019-10-01 MED ORDER — ADULT MULTIVITAMIN W/MINERALS CH
1.0000 | ORAL_TABLET | Freq: Every day | ORAL | Status: DC
  Administered 2019-10-01 – 2019-10-03 (×3): 1 via ORAL
  Filled 2019-10-01 (×3): qty 1

## 2019-10-01 MED ORDER — PROCHLORPERAZINE 25 MG RE SUPP
25.0000 mg | Freq: Two times a day (BID) | RECTAL | Status: DC | PRN
  Filled 2019-10-01 (×3): qty 1

## 2019-10-01 MED ORDER — POLYETHYLENE GLYCOL 3350 17 G PO PACK: 17.0000 g | PACK | Freq: Every day | ORAL | Status: DC | PRN

## 2019-10-01 MED ORDER — INSULIN GLARGINE 100 UNIT/ML ~~LOC~~ SOLN
70.0000 [IU] | Freq: Every day | SUBCUTANEOUS | Status: DC
  Administered 2019-10-01 – 2019-10-02 (×3): 70 [IU] via SUBCUTANEOUS
  Filled 2019-10-01 (×4): qty 0.7

## 2019-10-01 MED ORDER — METOCLOPRAMIDE HCL 5 MG/ML IJ SOLN
5.0000 mg | Freq: Three times a day (TID) | INTRAMUSCULAR | Status: DC | PRN
  Administered 2019-10-01 – 2019-10-02 (×3): 5 mg via INTRAVENOUS
  Filled 2019-10-01 (×3): qty 2

## 2019-10-01 MED ORDER — ATORVASTATIN CALCIUM 10 MG PO TABS
20.0000 mg | ORAL_TABLET | Freq: Two times a day (BID) | ORAL | Status: DC
  Administered 2019-10-01 – 2019-10-03 (×6): 20 mg via ORAL
  Filled 2019-10-01 (×6): qty 2

## 2019-10-01 MED ORDER — LUBIPROSTONE 24 MCG PO CAPS
24.0000 ug | ORAL_CAPSULE | Freq: Two times a day (BID) | ORAL | Status: DC
  Administered 2019-10-01 – 2019-10-03 (×5): 24 ug via ORAL
  Filled 2019-10-01 (×5): qty 1

## 2019-10-01 MED ORDER — VITAMIN B-12 100 MCG PO TABS
100.0000 ug | ORAL_TABLET | Freq: Every day | ORAL | Status: DC
  Administered 2019-10-01 – 2019-10-03 (×3): 100 ug via ORAL
  Filled 2019-10-01 (×3): qty 1

## 2019-10-01 MED ORDER — CLINDAMYCIN PHOSPHATE 900 MG/50ML IV SOLN
900.0000 mg | Freq: Three times a day (TID) | INTRAVENOUS | Status: DC
  Administered 2019-10-01 – 2019-10-03 (×8): 900 mg via INTRAVENOUS
  Filled 2019-10-01 (×9): qty 50

## 2019-10-01 MED ORDER — PROPRANOLOL HCL 20 MG PO TABS
20.0000 mg | ORAL_TABLET | Freq: Three times a day (TID) | ORAL | Status: DC
  Administered 2019-10-01 – 2019-10-03 (×8): 20 mg via ORAL
  Filled 2019-10-01 (×8): qty 1

## 2019-10-01 MED ORDER — PROMETHAZINE HCL 25 MG RE SUPP
25.0000 mg | Freq: Three times a day (TID) | RECTAL | Status: DC | PRN
  Filled 2019-10-01: qty 1

## 2019-10-01 MED ORDER — QUETIAPINE FUMARATE 100 MG PO TABS
300.0000 mg | ORAL_TABLET | Freq: Every day | ORAL | Status: DC
  Administered 2019-10-01 – 2019-10-02 (×3): 300 mg via ORAL
  Filled 2019-10-01 (×4): qty 3

## 2019-10-01 NOTE — Progress Notes (Signed)
PROGRESS NOTE    Nancy [name]  GEX:[id number] DOB: Jun 30, 2000 DOA: 09/30/2019 PCP: Fleet Contras, MD    Brief Narrative:  19 y.o. female with medical history significant of recurrent intractable nausea and vomiting, suspected gastroparesis, chronic pain syndrome, diastolic CHF, morbid obesity, CAD seizures, insulin-dependent diabetes and dyspepsia who presented to the ER with intractable nausea vomiting and swelling in her mouth.  Patient has had this over over again.  She was evaluated in the ER.  Found to be mildly dehydrated.  Also found to have left dental abscess.  She apparently saw a dentist before but has been unable to see one lately.  She was not having any fever or chills.  No diarrhea no hematemesis no bright red blood per rectum.  Patient has been tried on symptomatic treatment in the ER and symptoms have persisted  Assessment & Plan:   Principal Problem:   Intractable nausea and vomiting Active Problems:   Hypertension   Chronic abdominal pain   Conversion disorder with attacks or seizures, acute episode, with psychological stressor   Pseudoseizures   Dehydration   Cyclical vomiting with nausea   #1 intractable nausea with vomiting:  -Decreased tolerance to PO intake, was continued on clears with continued need for IVF and IV anti-emetics. Despite ongoing nausea, pt is asking to try regular diet -Continued with combination of Zofran Phenergan and Haldol. -Pt has long hx of cannabis use, states she eats cannabis gummy bears.  -Will check UDS, prior UDS pos for cannabis  -Will give trial of IV reglan PRN for breakthrough nausea as well as topical capsaicin cream -Repeat lytes in AM  #2 malignant hypertension:  -continued on home regimen -Currently well-controlled  #3 diabetes: Continue with sliding scale insulin as needed  #4 dental abscess:  -Initially started on IV antibiotics.   -Now transitioned to oral antibiotics.  -Recommend f/u with dentist  #5  chronic pain syndrome:  -Continue home regimen.   -Morphine was ordered at time of presentation due to pain.  #6 anxiety disorder with pseudoseizures:  -Continue her home regimen including Seroquel.  Also Topamax. -Seems to be stable at this time  DVT prophylaxis: Lovenox subq Code Status: Full Family Communication: Pt in room, family not at bedside  Status is: Inpatient  Remains inpatient appropriate because:IV treatments appropriate due to intensity of illness or inability to take PO   Dispo: The patient is from: Home              Anticipated d/c is to: Home              Anticipated d/c date is: 2 days              Patient currently is not medically stable to d/c.       Consultants:     Procedures:     Antimicrobials: Anti-infectives (From admission, onward)   Start     Dose/Rate Route Frequency Ordered Stop   10/01/19 0130  clindamycin (CLEOCIN) IVPB 900 mg        900 mg 100 mL/hr over 30 Minutes Intravenous Every 8 hours 10/01/19 0102     09/30/19 1700  clindamycin (CLEOCIN) IVPB 900 mg        900 mg 100 mL/hr over 30 Minutes Intravenous  Once 09/30/19 1652 09/30/19 1813       Subjective: Still feeling nauseated but is asking for a regular diet  Objective: Vitals:   09/30/19 2315 09/30/19 2334 10/01/19 0640 10/01/19 0942  BP:  118/73 130/90 113/77  Pulse: (!) 116 (!) 119 97 97  Resp: 11 (!) 24 18 18   Temp:  98.5 F (36.9 C) 97.9 F (36.6 C) 98.6 F (37 C)  TempSrc:  Oral Oral Oral  SpO2: 100% 99% 99% 99%  Weight:  93 kg    Height:  5\' 3"  (1.6 m)      Intake/Output Summary (Last 24 hours) at 10/01/2019 1443 Last data filed at 10/01/2019 0900 Gross per 24 hour  Intake 2697.5 ml  Output 0 ml  Net 2697.5 ml   Filed Weights   09/30/19 2334  Weight: 93 kg    Examination:  General exam: Appears calm and comfortable  Respiratory system: Clear to auscultation. Respiratory effort normal. Cardiovascular system: S1 & S2 heard,  Regular Gastrointestinal system: Abdomen is nondistended, soft and nontender. No organomegaly or masses felt. Normal bowel sounds heard. Central nervous system: Alert and oriented. No focal neurological deficits. Extremities: Symmetric 5 x 5 power. Skin: No rashes, lesions  Psychiatry: Judgement and insight appear normal. Mood & affect appropriate.   Data Reviewed: I have personally reviewed following labs and imaging studies  CBC: Recent Labs  Lab 09/30/19 1120 10/01/19 0349  WBC 9.2 14.2*  HGB 11.6* 9.6*  HCT 37.8 31.1*  MCV 80.4 80.2  PLT 303 294   Basic Metabolic Panel: Recent Labs  Lab 09/30/19 1120 10/01/19 0349  NA 137 135  K 4.3 3.1*  CL 100 105  CO2 28 23  GLUCOSE 367* 254*  BUN 5* 5*  CREATININE 0.62 0.60  CALCIUM 9.1 8.0*   GFR: Estimated Creatinine Clearance: 120.5 mL/min (by C-G formula based on SCr of 0.6 mg/dL). Liver Function Tests: Recent Labs  Lab 10/01/19 0349  AST 15  ALT 14  ALKPHOS 98  BILITOT <0.1*  PROT 5.5*  ALBUMIN 2.8*   No results for input(s): LIPASE, AMYLASE in the last 168 hours. No results for input(s): AMMONIA in the last 168 hours. Coagulation Profile: No results for input(s): INR, PROTIME in the last 168 hours. Cardiac Enzymes: No results for input(s): CKTOTAL, CKMB, CKMBINDEX, TROPONINI in the last 168 hours. BNP (last 3 results) No results for input(s): PROBNP in the last 8760 hours. HbA1C: No results for input(s): HGBA1C in the last 72 hours. CBG: Recent Labs  Lab 09/30/19 1353 09/30/19 2242 10/01/19 0638 10/01/19 1138  GLUCAP 383* 365* 259* 108*   Lipid Profile: No results for input(s): CHOL, HDL, LDLCALC, TRIG, CHOLHDL, LDLDIRECT in the last 72 hours. Thyroid Function Tests: Recent Labs    10/01/19 0349  TSH 0.098*   Anemia Panel: No results for input(s): VITAMINB12, FOLATE, FERRITIN, TIBC, IRON, RETICCTPCT in the last 72 hours. Sepsis Labs: No results for input(s): PROCALCITON, LATICACIDVEN in the  last 168 hours.  Recent Results (from the past 240 hour(s))  Respiratory Panel by RT PCR (Flu A&B, Covid) - Nasopharyngeal Swab     Status: None   Collection Time: 09/30/19  8:34 PM   Specimen: Nasopharyngeal Swab  Result Value Ref Range Status   SARS Coronavirus 2 by RT PCR NEGATIVE NEGATIVE Final    Comment: (NOTE) SARS-CoV-2 target nucleic acids are NOT DETECTED.  The SARS-CoV-2 RNA is generally detectable in upper respiratoy specimens during the acute phase of infection. The lowest concentration of SARS-CoV-2 viral copies this assay can detect is 131 copies/mL. A negative result does not preclude SARS-Cov-2 infection and should not be used as the sole basis for treatment or other patient management decisions. A negative result  may occur with  improper specimen collection/handling, submission of specimen other than nasopharyngeal swab, presence of viral mutation(s) within the areas targeted by this assay, and inadequate number of viral copies (<131 copies/mL). A negative result must be combined with clinical observations, patient history, and epidemiological information. The expected result is Negative.  Fact Sheet for Patients:  https://www.moore.com/  Fact Sheet for Healthcare Providers:  https://www.young.biz/  This test is no t yet approved or cleared by the [place] FDA and  has been authorized for detection and/or diagnosis of SARS-CoV-2 by FDA under an Emergency Use Authorization (EUA). This EUA will remain  in effect (meaning this test can be used) for the duration of the COVID-19 declaration under Section 564(b)(1) of the Act, 21 U.S.C. section 360bbb-3(b)(1), unless the authorization is terminated or revoked sooner.     Influenza A by PCR NEGATIVE NEGATIVE Final   Influenza B by PCR NEGATIVE NEGATIVE Final    Comment: (NOTE) The Xpert Xpress SARS-CoV-2/FLU/RSV assay is intended as an aid in  the diagnosis of influenza  from Nasopharyngeal swab specimens and  should not be used as a sole basis for treatment. Nasal washings and  aspirates are unacceptable for Xpert Xpress SARS-CoV-2/FLU/RSV  testing.  Fact Sheet for Patients: https://www.moore.com/  Fact Sheet for Healthcare Providers: https://www.young.biz/  This test is not yet approved or cleared by the [place] FDA and  has been authorized for detection and/or diagnosis of SARS-CoV-2 by  FDA under an Emergency Use Authorization (EUA). This EUA will remain  in effect (meaning this test can be used) for the duration of the  Covid-19 declaration under Section 564(b)(1) of the Act, 21  U.S.C. section 360bbb-3(b)(1), unless the authorization is  terminated or revoked. Performed at [facility], [address], [place], [place] [address]      Radiology Studies: CT Head Wo Contrast  Result Date: 09/30/2019 CLINICAL DATA:  19 year old female status post multiple recent seizures. Has been biting tongue. Pain and swelling around the jaw. EXAM: CT HEAD WITHOUT CONTRAST TECHNIQUE: Contiguous axial images were obtained from the base of the skull through the vertex without intravenous contrast. COMPARISON:  Brain MRI 12/01/2018. Head CT 01/15/2019. FINDINGS: Brain: Stable cerebral volume. 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 No midline shift, ventriculomegaly, mass effect, evidence of mass lesion, intracranial hemorrhage or evidence of cortically based acute infarction. Gray-white matter differentiation is within normal limits throughout the brain. Vascular: No suspicious intracranial vascular hyperdensity. Skull: Negative. Sinuses/Orbits: Visualized paranasal sinuses and mastoids are stable and well pneumatized. Other: Visualized orbits and scalp soft tissues are within normal limits. IMPRESSION: Stable and negative noncontrast CT appearance of  the brain. Electronically Signed   By: Odessa Fleming M.D.   On: 09/30/2019 16:38   CT ABDOMEN PELVIS W CONTRAST  Result Date: 09/30/2019 CLINICAL DATA:  Nonlocalized abdominal pain EXAM: CT ABDOMEN AND PELVIS WITH CONTRAST TECHNIQUE: Multidetector CT imaging of the abdomen and pelvis was performed using the standard protocol following bolus administration of intravenous contrast. CONTRAST:  45mL OMNIPAQUE IOHEXOL 300 MG/ML  SOLN COMPARISON:  08/26/2019 FINDINGS: Lower chest: Lung bases are clear. Hepatobiliary: No focal liver abnormality is seen. No gallstones, gallbladder wall thickening, or biliary dilatation. Pancreas: Unremarkable. No pancreatic ductal dilatation or surrounding inflammatory changes. Spleen: Normal in size without focal abnormality. Adrenals/Urinary Tract: 1.4 cm diameter left adrenal gland nodule. Kidneys are symmetrical with homogeneous nephrograms. No hydronephrosis or hydroureter. No focal masses. Bladder is contrast filled  although there is no contrast in the renal collecting systems or ureters. This suggest either previous contrast administration or contrast cystogram recently. No bladder wall thickening or filling defect. Stomach/Bowel: Stomach, small bowel, and colon are not abnormally distended. Colon is diffusely stool-filled. No wall thickening or inflammatory changes are appreciated. The appendix is normal. Vascular/Lymphatic: Scattered retroperitoneal lymph nodes are not pathologically enlarged. Normal caliber abdominal aorta. Reproductive: Uterus and bilateral adnexa are unremarkable. Other: No free air or free fluid in the abdomen. Abdominal wall musculature appears intact. Musculoskeletal: No acute or significant osseous findings. IMPRESSION: 1. No acute process demonstrated in the abdomen or pelvis. No evidence of bowel obstruction or inflammation. 2. 1.4 cm diameter left adrenal gland nodule, nonspecific but likely adenoma. Electronically Signed   By: Burman Nieves M.D.   On:  09/30/2019 22:34   CT Maxillofacial W Contrast  Result Date: 09/30/2019 CLINICAL DATA:  19 year old female status post multiple recent seizures. Has been biting tongue. Pain and swelling around the jaw. EXAM: CT MAXILLOFACIAL WITH CONTRAST TECHNIQUE: Multidetector CT imaging of the maxillofacial structures was performed with intravenous contrast. Multiplanar CT image reconstructions were also generated. CONTRAST:  4mL OMNIPAQUE IOHEXOL 300 MG/ML  SOLN COMPARISON:  Head CT today reported separately FINDINGS: Osseous: Mild motion artifact. Multifocal carious dentition, including the anterior maxillary and mandible dentition. Periapical lucency at the left mandible anterior molar is associated with some lateral cortical breakthrough (series 4, image 34) at the level of asymmetric left facial soft tissue swelling and stranding. See additional soft tissue details below. No mandible or facial fracture identified. Visible calvarium and cervical vertebrae appear intact. Orbits: Orbital walls appear intact.  Negative orbits soft tissues. Sinuses: Somewhat hypoplastic, but clear throughout. Soft tissues: Asymmetric soft tissue swelling and stranding around the body of the left mandible, epicenter at the anterior mandible molar as detailed above. And there is evidence of a small 10 mm subperiosteal abscess seen on coronal series 7, image 40. But no other soft tissue fluid is identified. No soft tissue gas. Mild left lower masticator space involvement. The sublingual space is spared. The submandibular, parotid, parapharyngeal, and retropharyngeal spaces are spared. Negative visible thyroid, larynx and pharynx. Reactive level 1 lymph nodes measure up to 14 mm short axis (left level 1 B station). No cystic or necrotic nodes. The visible major vascular structures in the neck and at the skull base are patent. Limited intracranial: Negative, see also head CT today reported separately. IMPRESSION: 1. Extensive dental caries with  odontogenic Cellulitis around the body of the left mandible. Small 10 mm subperiosteal abscess overlying the carious left anterior mandible molar. Reactive level 1 lymphadenopathy. 2. No other acute finding.  Mildly degraded by motion. Electronically Signed   By: Odessa Fleming M.D.   On: 09/30/2019 16:44   DG Abd 2 Views  Result Date: 09/30/2019 CLINICAL DATA:  NAUSEA AND VOMITING FOR 4 DAYS EXAM: X-RAY ABDOMEN 2 VIEWS COMPARISON:  09/04/2019 FINDINGS: Supine and upright frontal views of the abdomen and pelvis demonstrate a nonobstructive bowel gas pattern. There is a paucity of bowel gas. Minimal stool within the distal colon. No masses or abnormal calcifications. No free gas in the greater peritoneal sac. IMPRESSION: 1. No evidence of bowel obstruction or ileus.  Paucity of bowel gas. Electronically Signed   By: Sharlet Salina M.D.   On: 09/30/2019 15:37    Scheduled Meds: . ARIPiprazole  10 mg Oral Daily  . atorvastatin  20 mg Oral BID  . busPIRone  10  mg Oral BID  . capsaicin   Topical BID  . enoxaparin (LOVENOX) injection  40 mg Subcutaneous Q24H  . insulin aspart  0-20 Units Subcutaneous TID WC  . insulin aspart  0-5 Units Subcutaneous QHS  . insulin glargine  70 Units Subcutaneous QHS  . lamoTRIgine  25 mg Oral TID  . lubiprostone  24 mcg Oral BID WC  . mirtazapine  7.5 mg Oral QHS  . multivitamin with minerals  1 tablet Oral Daily  . pantoprazole  40 mg Oral BID  . potassium chloride  40 mEq Oral BID  . propranolol  20 mg Oral TID  . QUEtiapine  300 mg Oral QHS  . QUEtiapine  50 mg Oral BID  . sucralfate  1 g Oral BID  . topiramate  25 mg Oral BID  . vitamin B-12  100 mcg Oral Daily   Continuous Infusions: . sodium chloride 150 mL/hr at 10/01/19 0956  . clindamycin (CLEOCIN) IV 900 mg (10/01/19 0958)     LOS: 0 days   Rickey Barbara, MD [facility] Pager On Amion  If 7PM-7AM, please contact night-coverage 10/01/2019, 2:43 PM

## 2019-10-02 DIAGNOSIS — R1115 Cyclical vomiting syndrome unrelated to migraine: Secondary | ICD-10-CM

## 2019-10-02 LAB — COMPREHENSIVE METABOLIC PANEL
ALT: 15 U/L (ref 0–44)
AST: 14 U/L — ABNORMAL LOW (ref 15–41)
Albumin: 3.1 g/dL — ABNORMAL LOW (ref 3.5–5.0)
Alkaline Phosphatase: 97 U/L (ref 38–126)
Anion gap: 9 (ref 5–15)
BUN: <5 mg/dL — ABNORMAL LOW (ref 6–20)
CO2: 22 mmol/L (ref 22–32)
Calcium: 8.5 mg/dL — ABNORMAL LOW (ref 8.9–10.3)
Chloride: 105 mmol/L (ref 98–111)
Creatinine, Ser: 0.49 mg/dL (ref 0.44–1.00)
GFR calc Af Amer: >60 mL/min (ref >60)
GFR calc non Af Amer: >60 mL/min (ref >60)
Glucose, Bld: 143 mg/dL — ABNORMAL HIGH (ref 70–99)
Potassium: 3.3 mmol/L — ABNORMAL LOW (ref 3.5–5.1)
Sodium: 136 mmol/L (ref 135–145)
Total Bilirubin: 0.4 mg/dL (ref 0.3–1.2)
Total Protein: 6.2 g/dL — ABNORMAL LOW (ref 6.5–8.1)

## 2019-10-02 LAB — MAGNESIUM: Magnesium: 1.9 mg/dL (ref 1.7–2.4)

## 2019-10-02 LAB — CBC
HCT: 36 % (ref 36.0–46.0)
Hemoglobin: 11.2 g/dL — ABNORMAL LOW (ref 12.0–15.0)
MCH: 24.7 pg — ABNORMAL LOW (ref 26.0–34.0)
MCHC: 31.1 g/dL (ref 30.0–36.0)
MCV: 79.3 fL — ABNORMAL LOW (ref 80.0–100.0)
Platelets: 355 10*3/uL (ref 150–400)
RBC: 4.54 MIL/uL (ref 3.87–5.11)
RDW: 15 % (ref 11.5–15.5)
WBC: 10.3 10*3/uL (ref 4.0–10.5)
nRBC: 0 % (ref 0.0–0.2)

## 2019-10-02 LAB — GLUCOSE, CAPILLARY
Glucose-Capillary: 163 mg/dL — ABNORMAL HIGH (ref 70–99)
Glucose-Capillary: 168 mg/dL — ABNORMAL HIGH (ref 70–99)
Glucose-Capillary: 164 mg/dL — ABNORMAL HIGH (ref 70–99)
Glucose-Capillary: 147 mg/dL — ABNORMAL HIGH (ref 70–99)
Glucose-Capillary: 111 mg/dL — ABNORMAL HIGH (ref 70–99)

## 2019-10-02 MED ORDER — METOCLOPRAMIDE HCL 5 MG/ML IJ SOLN
5.0000 mg | Freq: Three times a day (TID) | INTRAMUSCULAR | Status: DC
  Administered 2019-10-02 – 2019-10-03 (×3): 5 mg via INTRAVENOUS
  Filled 2019-10-02 (×3): qty 2

## 2019-10-02 MED ORDER — BISACODYL 5 MG PO TBEC
5.0000 mg | DELAYED_RELEASE_TABLET | Freq: Once | ORAL | Status: AC
  Administered 2019-10-02: 5 mg via ORAL
  Filled 2019-10-02: qty 1

## 2019-10-02 NOTE — Progress Notes (Signed)
PROGRESS NOTE    Nancy [name]  JOI:[id number] DOB: 12-23-2000 DOA: 09/30/2019 PCP: Fleet Contras, MD    Brief Narrative:  19 y.o. female with medical history significant of recurrent intractable nausea and vomiting, suspected gastroparesis, chronic pain syndrome, diastolic CHF, morbid obesity, CAD seizures, insulin-dependent diabetes and dyspepsia who presented to the ER with intractable nausea vomiting and swelling in her mouth.  Patient has had this over over again.  She was evaluated in the ER.  Found to be mildly dehydrated.  Also found to have left dental abscess.  She apparently saw a dentist before but has been unable to see one lately.  She was not having any fever or chills.  No diarrhea no hematemesis no bright red blood per rectum.  Patient has been tried on symptomatic treatment in the ER and symptoms have persisted  Assessment & Plan:   Principal Problem:   Intractable nausea and vomiting Active Problems:   Hypertension   Chronic abdominal pain   Conversion disorder with attacks or seizures, acute episode, with psychological stressor   Pseudoseizures   Dehydration   Cyclical vomiting with nausea   #1 intractable nausea with vomiting:  -Decreased tolerance to PO intake, was continued on clears with continued need for IVF and IV anti-emetics. Despite ongoing nausea, pt is asking to try regular diet -Continued with combination of Zofran Phenergan and Haldol. -Pt has long hx of cannabis use, states she eats cannabis gummy bears.  -UDS pos for cannabis  -Will give trial of IV reglan PRN for breakthrough nausea as well as topical capsaicin cream -Still having significant abd discomfort and nausea, intolerance to PO intake -Personally reviewed CT abd. Findings of stool throughout colon, extending to rectal vault -patient was unable to recall when she last had a bowel movement, only knows it had been a long time ago -Will give trial of dulcolax with enema. Will also  schedule reglan  #2 malignant hypertension:  -continued on home regimen -Currently controlled at this time  #3 diabetes: Continue with sliding scale insulin as needed  #4 dental abscess:  -Initially started on IV antibiotics.   -Now transitioned to oral antibiotics.  -Recommend f/u with dentist when discharged  #5 chronic pain syndrome:  -Continue home regimen.   -Morphine was ordered at time of presentation due to pain.  #6 anxiety disorder with pseudoseizures:  -Continue her home regimen including Seroquel.  Also Topamax. -Seems to be stable currently  DVT prophylaxis: Lovenox subq Code Status: Full Family Communication: Pt in room, family not at bedside  Status is: Inpatient  Remains inpatient appropriate because:IV treatments appropriate due to intensity of illness or inability to take PO   Dispo: The patient is from: Home              Anticipated d/c is to: Home              Anticipated d/c date is: 2 days              Patient currently is not medically stable to d/c.    Consultants:     Procedures:     Antimicrobials: Anti-infectives (From admission, onward)   Start     Dose/Rate Route Frequency Ordered Stop   10/01/19 0130  clindamycin (CLEOCIN) IVPB 900 mg        900 mg 100 mL/hr over 30 Minutes Intravenous Every 8 hours 10/01/19 0102     09/30/19 1700  clindamycin (CLEOCIN) IVPB 900 mg  900 mg 100 mL/hr over 30 Minutes Intravenous  Once 09/30/19 1652 09/30/19 1813      Subjective: Complaining of abd pain, intolerance to po intake, even clears  Objective: Vitals:   10/01/19 1658 10/01/19 2103 10/02/19 0519 10/02/19 1045  BP: (!) 147/94 (!) 146/112  (!) 131/91  Pulse: (!) 101 92 97 94  Resp: 20 17 17 18   Temp: 97.9 F (36.6 C) 97.9 F (36.6 C) 98 F (36.7 C)   TempSrc: Oral Oral Oral   SpO2: 100% 100% 100%   Weight:  93.2 kg    Height:        Intake/Output Summary (Last 24 hours) at 10/02/2019 1450 Last data filed at  10/02/2019 1300 Gross per 24 hour  Intake 220 ml  Output 2400 ml  Net -2180 ml   Filed Weights   09/30/19 2334 10/01/19 2103  Weight: 93 kg 93.2 kg    Examination: General exam: Awake, sitting in bed, appears acutely  Respiratory system: Normal respiratory effort, no wheezing Cardiovascular system: regular rate, s1, s2 Gastrointestinal system: decreased BS, generally distended Central nervous system: CN2-12 grossly intact, strength intact Extremities: Perfused, no clubbing Skin: Normal skin turgor, no notable skin lesions seen Psychiatry: Mood normal // no visual hallucinations   Data Reviewed: I have personally reviewed following labs and imaging studies  CBC: Recent Labs  Lab 09/30/19 1120 10/01/19 0349  WBC 9.2 14.2*  HGB 11.6* 9.6*  HCT 37.8 31.1*  MCV 80.4 80.2  PLT 303 294   Basic Metabolic Panel: Recent Labs  Lab 09/30/19 1120 10/01/19 0349  NA 137 135  K 4.3 3.1*  CL 100 105  CO2 28 23  GLUCOSE 367* 254*  BUN 5* 5*  CREATININE 0.62 0.60  CALCIUM 9.1 8.0*   GFR: Estimated Creatinine Clearance: 120.6 mL/min (by C-G formula based on SCr of 0.6 mg/dL). Liver Function Tests: Recent Labs  Lab 10/01/19 0349  AST 15  ALT 14  ALKPHOS 98  BILITOT <0.1*  PROT 5.5*  ALBUMIN 2.8*   No results for input(s): LIPASE, AMYLASE in the last 168 hours. No results for input(s): AMMONIA in the last 168 hours. Coagulation Profile: No results for input(s): INR, PROTIME in the last 168 hours. Cardiac Enzymes: No results for input(s): CKTOTAL, CKMB, CKMBINDEX, TROPONINI in the last 168 hours. BNP (last 3 results) No results for input(s): PROBNP in the last 8760 hours. HbA1C: No results for input(s): HGBA1C in the last 72 hours. CBG: Recent Labs  Lab 10/01/19 1701 10/01/19 2107 10/02/19 0633 10/02/19 0835 10/02/19 1150  GLUCAP 208* 163* 163* 168* 164*   Lipid Profile: No results for input(s): CHOL, HDL, LDLCALC, TRIG, CHOLHDL, LDLDIRECT in the last 72  hours. Thyroid Function Tests: Recent Labs    10/01/19 0349  TSH 0.098*   Anemia Panel: No results for input(s): VITAMINB12, FOLATE, FERRITIN, TIBC, IRON, RETICCTPCT in the last 72 hours. Sepsis Labs: No results for input(s): PROCALCITON, LATICACIDVEN in the last 168 hours.  Recent Results (from the past 240 hour(s))  Respiratory Panel by RT PCR (Flu A&B, Covid) - Nasopharyngeal Swab     Status: None   Collection Time: 09/30/19  8:34 PM   Specimen: Nasopharyngeal Swab  Result Value Ref Range Status   SARS Coronavirus 2 by RT PCR NEGATIVE NEGATIVE Final    Comment: (NOTE) SARS-CoV-2 target nucleic acids are NOT DETECTED.  The SARS-CoV-2 RNA is generally detectable in upper respiratoy specimens during the acute phase of infection. The lowest concentration of SARS-CoV-2  viral copies this assay can detect is 131 copies/mL. A negative result does not preclude SARS-Cov-2 infection and should not be used as the sole basis for treatment or other patient management decisions. A negative result may occur with  improper specimen collection/handling, submission of specimen other than nasopharyngeal swab, presence of viral mutation(s) within the areas targeted by this assay, and inadequate number of viral copies (<131 copies/mL). A negative result must be combined with clinical observations, patient history, and epidemiological information. The expected result is Negative.  Fact Sheet for Patients:  https://www.moore.com/  Fact Sheet for Healthcare Providers:  https://www.young.biz/  This test is no t yet approved or cleared by the [place] FDA and  has been authorized for detection and/or diagnosis of SARS-CoV-2 by FDA under an Emergency Use Authorization (EUA). This EUA will remain  in effect (meaning this test can be used) for the duration of the COVID-19 declaration under Section 564(b)(1) of the Act, 21 U.S.C. section 360bbb-3(b)(1),  unless the authorization is terminated or revoked sooner.     Influenza A by PCR NEGATIVE NEGATIVE Final   Influenza B by PCR NEGATIVE NEGATIVE Final    Comment: (NOTE) The Xpert Xpress SARS-CoV-2/FLU/RSV assay is intended as an aid in  the diagnosis of influenza from Nasopharyngeal swab specimens and  should not be used as a sole basis for treatment. Nasal washings and  aspirates are unacceptable for Xpert Xpress SARS-CoV-2/FLU/RSV  testing.  Fact Sheet for Patients: https://www.moore.com/  Fact Sheet for Healthcare Providers: https://www.young.biz/  This test is not yet approved or cleared by the [place] FDA and  has been authorized for detection and/or diagnosis of SARS-CoV-2 by  FDA under an Emergency Use Authorization (EUA). This EUA will remain  in effect (meaning this test can be used) for the duration of the  Covid-19 declaration under Section 564(b)(1) of the Act, 21  U.S.C. section 360bbb-3(b)(1), unless the authorization is  terminated or revoked. Performed at [facility], [address], [place], [place] [address]      Radiology Studies: CT Head Wo Contrast  Result Date: 09/30/2019 CLINICAL DATA:  19 year old female status post multiple recent seizures. Has been biting tongue. Pain and swelling around the jaw. EXAM: CT HEAD WITHOUT CONTRAST TECHNIQUE: Contiguous axial images were obtained from the base of the skull through the vertex without intravenous contrast. COMPARISON:  Brain MRI 12/01/2018. Head CT 01/15/2019. FINDINGS: Brain: Stable cerebral volume. 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 No midline shift, ventriculomegaly, mass effect, evidence of mass lesion, intracranial hemorrhage or evidence of cortically based acute infarction. Gray-white matter differentiation is within normal limits throughout the brain. Vascular: No suspicious  intracranial vascular hyperdensity. Skull: Negative. Sinuses/Orbits: Visualized paranasal sinuses and mastoids are stable and well pneumatized. Other: Visualized orbits and scalp soft tissues are within normal limits. IMPRESSION: Stable and negative noncontrast CT appearance of the brain. Electronically Signed   By: Odessa Fleming M.D.   On: 09/30/2019 16:38   CT ABDOMEN PELVIS W CONTRAST  Result Date: 09/30/2019 CLINICAL DATA:  Nonlocalized abdominal pain EXAM: CT ABDOMEN AND PELVIS WITH CONTRAST TECHNIQUE: Multidetector CT imaging of the abdomen and pelvis was performed using the standard protocol following bolus administration of intravenous contrast. CONTRAST:  83mL OMNIPAQUE IOHEXOL 300 MG/ML  SOLN COMPARISON:  08/26/2019 FINDINGS: Lower chest: Lung bases are clear. Hepatobiliary: No focal liver abnormality is seen. No gallstones, gallbladder wall thickening, or biliary dilatation. Pancreas: Unremarkable. No pancreatic ductal dilatation or  surrounding inflammatory changes. Spleen: Normal in size without focal abnormality. Adrenals/Urinary Tract: 1.4 cm diameter left adrenal gland nodule. Kidneys are symmetrical with homogeneous nephrograms. No hydronephrosis or hydroureter. No focal masses. Bladder is contrast filled although there is no contrast in the renal collecting systems or ureters. This suggest either previous contrast administration or contrast cystogram recently. No bladder wall thickening or filling defect. Stomach/Bowel: Stomach, small bowel, and colon are not abnormally distended. Colon is diffusely stool-filled. No wall thickening or inflammatory changes are appreciated. The appendix is normal. Vascular/Lymphatic: Scattered retroperitoneal lymph nodes are not pathologically enlarged. Normal caliber abdominal aorta. Reproductive: Uterus and bilateral adnexa are unremarkable. Other: No free air or free fluid in the abdomen. Abdominal wall musculature appears intact. Musculoskeletal: No acute or  significant osseous findings. IMPRESSION: 1. No acute process demonstrated in the abdomen or pelvis. No evidence of bowel obstruction or inflammation. 2. 1.4 cm diameter left adrenal gland nodule, nonspecific but likely adenoma. Electronically Signed   By: Burman Nieves M.D.   On: 09/30/2019 22:34   CT Maxillofacial W Contrast  Result Date: 09/30/2019 CLINICAL DATA:  19 year old female status post multiple recent seizures. Has been biting tongue. Pain and swelling around the jaw. EXAM: CT MAXILLOFACIAL WITH CONTRAST TECHNIQUE: Multidetector CT imaging of the maxillofacial structures was performed with intravenous contrast. Multiplanar CT image reconstructions were also generated. CONTRAST:  75mL OMNIPAQUE IOHEXOL 300 MG/ML  SOLN COMPARISON:  Head CT today reported separately FINDINGS: Osseous: Mild motion artifact. Multifocal carious dentition, including the anterior maxillary and mandible dentition. Periapical lucency at the left mandible anterior molar is associated with some lateral cortical breakthrough (series 4, image 34) at the level of asymmetric left facial soft tissue swelling and stranding. See additional soft tissue details below. No mandible or facial fracture identified. Visible calvarium and cervical vertebrae appear intact. Orbits: Orbital walls appear intact.  Negative orbits soft tissues. Sinuses: Somewhat hypoplastic, but clear throughout. Soft tissues: Asymmetric soft tissue swelling and stranding around the body of the left mandible, epicenter at the anterior mandible molar as detailed above. And there is evidence of a small 10 mm subperiosteal abscess seen on coronal series 7, image 40. But no other soft tissue fluid is identified. No soft tissue gas. Mild left lower masticator space involvement. The sublingual space is spared. The submandibular, parotid, parapharyngeal, and retropharyngeal spaces are spared. Negative visible thyroid, larynx and pharynx. Reactive level 1 lymph nodes  measure up to 14 mm short axis (left level 1 B station). No cystic or necrotic nodes. The visible major vascular structures in the neck and at the skull base are patent. Limited intracranial: Negative, see also head CT today reported separately. IMPRESSION: 1. Extensive dental caries with odontogenic Cellulitis around the body of the left mandible. Small 10 mm subperiosteal abscess overlying the carious left anterior mandible molar. Reactive level 1 lymphadenopathy. 2. No other acute finding.  Mildly degraded by motion. Electronically Signed   By: Odessa Fleming M.D.   On: 09/30/2019 16:44   DG Abd 2 Views  Result Date: 09/30/2019 CLINICAL DATA:  NAUSEA AND VOMITING FOR 4 DAYS EXAM: X-RAY ABDOMEN 2 VIEWS COMPARISON:  09/04/2019 FINDINGS: Supine and upright frontal views of the abdomen and pelvis demonstrate a nonobstructive bowel gas pattern. There is a paucity of bowel gas. Minimal stool within the distal colon. No masses or abnormal calcifications. No free gas in the greater peritoneal sac. IMPRESSION: 1. No evidence of bowel obstruction or ileus.  Paucity of bowel gas. Electronically Signed  By: Sharlet Salina M.D.   On: 09/30/2019 15:37    Scheduled Meds: . ARIPiprazole  10 mg Oral Daily  . atorvastatin  20 mg Oral BID  . busPIRone  10 mg Oral BID  . capsaicin   Topical BID  . enoxaparin (LOVENOX) injection  40 mg Subcutaneous Q24H  . insulin aspart  0-20 Units Subcutaneous TID WC  . insulin aspart  0-5 Units Subcutaneous QHS  . insulin glargine  70 Units Subcutaneous QHS  . lamoTRIgine  25 mg Oral TID  . lubiprostone  24 mcg Oral BID WC  . metoCLOPramide (REGLAN) injection  5 mg Intravenous Q8H  . mirtazapine  7.5 mg Oral QHS  . multivitamin with minerals  1 tablet Oral Daily  . pantoprazole  40 mg Oral BID  . propranolol  20 mg Oral TID  . QUEtiapine  300 mg Oral QHS  . QUEtiapine  50 mg Oral BID  . sucralfate  1 g Oral BID  . topiramate  25 mg Oral BID  . vitamin B-12  100 mcg Oral Daily     Continuous Infusions: . sodium chloride 150 mL/hr at 10/02/19 0107  . clindamycin (CLEOCIN) IV 900 mg (10/02/19 0854)     LOS: 1 day   Rickey Barbara, MD [facility] Pager On Amion  If 7PM-7AM, please contact night-coverage 10/02/2019, 2:50 PM

## 2019-10-02 NOTE — Progress Notes (Signed)
Patient has not voided this shift, bladder scan showed 24ml. MD Rhona Leavens made aware. Orders to bladder scan q 4.

## 2019-10-03 DIAGNOSIS — I10 Essential (primary) hypertension: Secondary | ICD-10-CM

## 2019-10-03 LAB — COMPREHENSIVE METABOLIC PANEL
ALT: 14 U/L (ref 0–44)
AST: 12 U/L — ABNORMAL LOW (ref 15–41)
Albumin: 2.9 g/dL — ABNORMAL LOW (ref 3.5–5.0)
Alkaline Phosphatase: 89 U/L (ref 38–126)
Anion gap: 8 (ref 5–15)
BUN: <5 mg/dL — ABNORMAL LOW (ref 6–20)
CO2: 24 mmol/L (ref 22–32)
Calcium: 8.4 mg/dL — ABNORMAL LOW (ref 8.9–10.3)
Chloride: 105 mmol/L (ref 98–111)
Creatinine, Ser: 0.63 mg/dL (ref 0.44–1.00)
GFR calc Af Amer: >60 mL/min (ref >60)
GFR calc non Af Amer: >60 mL/min (ref >60)
Glucose, Bld: 220 mg/dL — ABNORMAL HIGH (ref 70–99)
Potassium: 3.1 mmol/L — ABNORMAL LOW (ref 3.5–5.1)
Sodium: 137 mmol/L (ref 135–145)
Total Bilirubin: 0.4 mg/dL (ref 0.3–1.2)
Total Protein: 5.8 g/dL — ABNORMAL LOW (ref 6.5–8.1)

## 2019-10-03 LAB — CBC
HCT: 34.1 % — ABNORMAL LOW (ref 36.0–46.0)
Hemoglobin: 10.8 g/dL — ABNORMAL LOW (ref 12.0–15.0)
MCH: 25.4 pg — ABNORMAL LOW (ref 26.0–34.0)
MCHC: 31.7 g/dL (ref 30.0–36.0)
MCV: 80.2 fL (ref 80.0–100.0)
Platelets: 344 10*3/uL (ref 150–400)
RBC: 4.25 MIL/uL (ref 3.87–5.11)
RDW: 15.2 % (ref 11.5–15.5)
WBC: 10.7 10*3/uL — ABNORMAL HIGH (ref 4.0–10.5)
nRBC: 0 % (ref 0.0–0.2)

## 2019-10-03 LAB — GLUCOSE, CAPILLARY
Glucose-Capillary: 135 mg/dL — ABNORMAL HIGH (ref 70–99)
Glucose-Capillary: 106 mg/dL — ABNORMAL HIGH (ref 70–99)

## 2019-10-03 MED ORDER — METOCLOPRAMIDE HCL 5 MG PO TABS: 5.0000 mg | ORAL_TABLET | Freq: Three times a day (TID) | ORAL | 0 refills | Status: DC

## 2019-10-03 MED ORDER — AMOXICILLIN-POT CLAVULANATE 875-125 MG PO TABS: 1.0000 | ORAL_TABLET | Freq: Two times a day (BID) | ORAL | 0 refills | Status: DC

## 2019-10-03 MED ORDER — POLYETHYLENE GLYCOL 3350 17 G PO PACK: 17.0000 g | PACK | Freq: Every day | ORAL | 0 refills | Status: DC

## 2019-10-03 MED ORDER — POTASSIUM CHLORIDE CRYS ER 20 MEQ PO TBCR
40.0000 meq | EXTENDED_RELEASE_TABLET | Freq: Two times a day (BID) | ORAL | Status: DC
  Administered 2019-10-03: 40 meq via ORAL
  Filled 2019-10-03: qty 2

## 2019-10-03 MED ORDER — AMOXICILLIN-POT CLAVULANATE 875-125 MG PO TABS: 1.0000 | ORAL_TABLET | Freq: Two times a day (BID) | ORAL | 0 refills | Status: AC

## 2019-10-03 NOTE — Progress Notes (Signed)
Pt has been voiding fine throughout the night. Pt also was very hungry and tolerating chicken broth and jello well. RN received an order to advance diet as tolerated, per pt's request from MD and had a sandwich, which she kept down without any nausea or stomach discomfort. Pt stated she wanted to try some scrambled eggs this am. Pt is now on a soft diet. Pt is in good spirits and seems to be excited to have something other than liquid for breakfast. Will continue to monitor.   Larey Days, RN

## 2019-10-03 NOTE — Discharge Summary (Signed)
Physician Discharge Summary  Nancy [name]:[id number] DOB: 2000/07/24 DOA: 09/30/2019  PCP: Fleet Contras, MD  Admit date: 09/30/2019 Discharge date: 10/03/2019  Admitted From: Home Disposition:  Home  Recommendations for Outpatient Follow-up:  1. Follow up with PCP in 1-2 weeks 2. Follow up with motility clinic as instructed to do so at prior hospitalization  Discharge Condition:Improved CODE STATUS:Full Diet recommendation: Regular   Brief/Interim Summary: 19 y.o.femalewith medical history significant ofrecurrent intractable nausea and vomiting, suspected gastroparesis, chronic pain syndrome, diastolic CHF, morbid obesity, CAD seizures, insulin-dependent diabetes and dyspepsia who presented to the ER with intractable nausea vomiting and swelling in her mouth. Patient has had this over over again. She was evaluated in the ER. Found to be mildly dehydrated. Also found to have left dental abscess. She apparently saw a dentist before but has been unable to see one lately. She was not having any fever or chills. No diarrhea no hematemesis no bright red blood per rectum. Patient has been tried on symptomatic treatment in the ER and symptoms have persisted  Discharge Diagnoses:  Principal Problem:   Intractable nausea and vomiting Active Problems:   Hypertension   Chronic abdominal pain   Conversion disorder with attacks or seizures, acute episode, with psychological stressor   Pseudoseizures   Dehydration   Cyclical vomiting with nausea  #1intractable nausea with vomiting: -Decreased tolerance to PO intake, was continued on clears with continued need for IVF and IV anti-emetics. Despite ongoing nausea, pt is asking to try regular diet -Continued with combination of Zofran Phenergan and Haldol. -Pt has long hx of cannabis use, states she eats cannabis gummy bears.  -UDS pos for cannabis  -Given trial of IV reglan PRN for breakthrough nausea as well as topical  capsaicin cream -Personally reviewed CT abd. Findings of stool throughout colon, extending to rectal vault -patient was unable to recall when she last had a bowel movement, only knows it had been a long time ago -Chart reviewed and pt was recently admitted for symptomatic anemia as well, was referred to motility clinic at that time, however pt did not f/u citing lack of funds -Ultimately, very good results noted with trial of dulcolax with enema and scheduled reglan  #2 malignant hypertension: -continued on home regimen -Currently controlled at this time  #3 diabetes:Continue with sliding scale insulin as needed  #4 dental abscess: -Initially started on IV antibiotics.  -Now transitioned to oral antibiotics.  -Recommend f/u with dentist when discharged  #5 chronic pain syndrome: -Continue home regimen.  -Morphine was ordered at time of presentation due to pain.  #6 anxiety disorder with pseudoseizures: -Continue her home regimen including Seroquel. Also Topamax. -Seems to be stable currently  Discharge Instructions   Allergies as of 10/03/2019      Reactions   Ibuprofen Other (See Comments)   GI MD said to not take this anymore   Tomato    Oatmeal Rash      Medication List    TAKE these medications   Abilify 10 MG tablet Generic drug: ARIPiprazole Take 10 mg by mouth in the morning and at bedtime.   amoxicillin-clavulanate 875-125 MG tablet Commonly known as: Augmentin Take 1 tablet by mouth 2 (two) times daily for 7 days.   atorvastatin 20 MG tablet Commonly known as: LIPITOR Take 1 tablet (20 mg total) by mouth daily at 6 PM. What changed: when to take this   busPIRone 10 MG tablet Commonly known as: BUSPAR Take 10 mg by mouth 2 (  two) times daily.   famotidine 40 MG tablet Commonly known as: PEPCID Take 40 mg by mouth 2 (two) times daily.   FDgard 25-20.75 MG Caps Generic drug: Car[place] Oil-Levomenthol Take 2 capsules by mouth as needed.  Take as directed as needed What changed:   when to take this  additional instructions   lamoTRIgine 25 MG tablet Commonly known as: LAMICTAL Take 2 tablets (50 mg total) by mouth 2 (two) times daily. For mood stabilization What changed:   how much to take  when to take this   Lantus SoloStar 100 UNIT/ML Solostar Pen Generic drug: insulin glargine Inject 70 Units into the skin at bedtime.   lidocaine 2 % solution Commonly known as: XYLOCAINE 3 mLs as needed for mouth pain.   lubiprostone 24 MCG capsule Commonly known as: AMITIZA Take 1 capsule (24 mcg total) by mouth 2 (two) times daily with a meal. What changed: when to take this   metoCLOPramide 5 MG tablet Commonly known as: Reglan Take 1 tablet (5 mg total) by mouth 3 (three) times daily.   mirtazapine 7.5 MG tablet Commonly known as: REMERON Take 1 tablet (7.5 mg total) by mouth at bedtime. For depression/sleep   multivitamin with minerals Tabs tablet Take 1 tablet by mouth daily.   NovoLOG FlexPen 100 UNIT/ML FlexPen Generic drug: insulin aspart Inject 15 Units into the skin 2 (two) times daily with a meal.   pantoprazole 40 MG tablet Commonly known as: PROTONIX Take 1 tablet (40 mg total) by mouth 2 (two) times daily. For acid reflux   polyethylene glycol 17 g packet Commonly known as: MIRALAX / GLYCOLAX Take 17 g by mouth daily. What changed:   when to take this  reasons to take this   prochlorperazine 25 MG suppository Commonly known as: COMPAZINE Place 1 suppository (25 mg total) rectally every 12 (twelve) hours as needed for nausea or vomiting.   promethazine 25 MG suppository Commonly known as: PHENERGAN Place 25 mg rectally every 8 (eight) hours as needed for nausea or vomiting.   propranolol 20 MG tablet Commonly known as: INDERAL Take 1 tablet (20 mg total) by mouth 2 (two) times daily. For anxiety/HTN   QUEtiapine 50 MG tablet Commonly known as: SEROQUEL Take 50 mg by mouth 2 (two)  times daily.   QUEtiapine 100 MG tablet Commonly known as: SEROQUEL Take 300 mg by mouth at bedtime.   sucralfate 1 GM/10ML suspension Commonly known as: CARAFATE Take 1 g by mouth 2 (two) times daily.   topiramate 25 MG tablet Commonly known as: TOPAMAX Take 1 tablet (25 mg total) by mouth 2 (two) times daily. For seizure activities   VITAMIN B-12 PO Take by mouth. Chew one gummy daily   VOLTAREN EX Apply 1 application topically 2 (two) times daily as needed (chest pains).       Follow-up Information    Fleet Contras, MD. Schedule an appointment as soon as possible for a visit in 1 week(s).   Specialty: Internal Medicine Contact information: [address] [place] [address] [phone]        Pricilla Riffle, MD .   Specialty: Cardiology Contact information: [address] [place] [address] [phone]              Allergies  Allergen Reactions  . Ibuprofen Other (See Comments)    GI MD said to not take this anymore  . Tomato   . Oatmeal Rash     Procedures/Studies:  CT Head Wo Contrast  Result Date: 09/30/2019 CLINICAL DATA:  19 year old female status post multiple recent seizures. Has been biting tongue. Pain and swelling around the jaw. EXAM: CT HEAD WITHOUT CONTRAST TECHNIQUE: Contiguous axial images were obtained from the base of the skull through the vertex without intravenous contrast. COMPARISON:  Brain MRI 12/01/2018. Head CT 01/15/2019. FINDINGS: Brain: Stable cerebral volume. 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 No midline shift, ventriculomegaly, mass effect, evidence of mass lesion, intracranial hemorrhage or evidence of cortically based acute infarction. Gray-white matter differentiation is within normal limits throughout the brain. Vascular: No suspicious intracranial vascular hyperdensity. Skull: Negative. Sinuses/Orbits: Visualized paranasal  sinuses and mastoids are stable and well pneumatized. Other: Visualized orbits and scalp soft tissues are within normal limits. IMPRESSION: Stable and negative noncontrast CT appearance of the brain. Electronically Signed   By: Odessa Fleming M.D.   On: 09/30/2019 16:38   CT ABDOMEN PELVIS W CONTRAST  Result Date: 09/30/2019 CLINICAL DATA:  Nonlocalized abdominal pain EXAM: CT ABDOMEN AND PELVIS WITH CONTRAST TECHNIQUE: Multidetector CT imaging of the abdomen and pelvis was performed using the standard protocol following bolus administration of intravenous contrast. CONTRAST:  85mL OMNIPAQUE IOHEXOL 300 MG/ML  SOLN COMPARISON:  08/26/2019 FINDINGS: Lower chest: Lung bases are clear. Hepatobiliary: No focal liver abnormality is seen. No gallstones, gallbladder wall thickening, or biliary dilatation. Pancreas: Unremarkable. No pancreatic ductal dilatation or surrounding inflammatory changes. Spleen: Normal in size without focal abnormality. Adrenals/Urinary Tract: 1.4 cm diameter left adrenal gland nodule. Kidneys are symmetrical with homogeneous nephrograms. No hydronephrosis or hydroureter. No focal masses. Bladder is contrast filled although there is no contrast in the renal collecting systems or ureters. This suggest either previous contrast administration or contrast cystogram recently. No bladder wall thickening or filling defect. Stomach/Bowel: Stomach, small bowel, and colon are not abnormally distended. Colon is diffusely stool-filled. No wall thickening or inflammatory changes are appreciated. The appendix is normal. Vascular/Lymphatic: Scattered retroperitoneal lymph nodes are not pathologically enlarged. Normal caliber abdominal aorta. Reproductive: Uterus and bilateral adnexa are unremarkable. Other: No free air or free fluid in the abdomen. Abdominal wall musculature appears intact. Musculoskeletal: No acute or significant osseous findings. IMPRESSION: 1. No acute process demonstrated in the abdomen or  pelvis. No evidence of bowel obstruction or inflammation. 2. 1.4 cm diameter left adrenal gland nodule, nonspecific but likely adenoma. Electronically Signed   By: Burman Nieves M.D.   On: 09/30/2019 22:34   CT Maxillofacial W Contrast  Result Date: 09/30/2019 CLINICAL DATA:  19 year old female status post multiple recent seizures. Has been biting tongue. Pain and swelling around the jaw. EXAM: CT MAXILLOFACIAL WITH CONTRAST TECHNIQUE: Multidetector CT imaging of the maxillofacial structures was performed with intravenous contrast. Multiplanar CT image reconstructions were also generated. CONTRAST:  75mL OMNIPAQUE IOHEXOL 300 MG/ML  SOLN COMPARISON:  Head CT today reported separately FINDINGS: Osseous: Mild motion artifact. Multifocal carious dentition, including the anterior maxillary and mandible dentition. Periapical lucency at the left mandible anterior molar is associated with some lateral cortical breakthrough (series 4, image 34) at the level of asymmetric left facial soft tissue swelling and stranding. See additional soft tissue details below. No mandible or facial fracture identified. Visible calvarium and cervical vertebrae appear intact. Orbits: Orbital walls appear intact.  Negative orbits soft tissues. Sinuses: Somewhat hypoplastic, but clear throughout. Soft tissues: Asymmetric soft tissue swelling and stranding around the body of the left mandible, epicenter at the anterior mandible molar as detailed  above. And there is evidence of a small 10 mm subperiosteal abscess seen on coronal series 7, image 40. But no other soft tissue fluid is identified. No soft tissue gas. Mild left lower masticator space involvement. The sublingual space is spared. The submandibular, parotid, parapharyngeal, and retropharyngeal spaces are spared. Negative visible thyroid, larynx and pharynx. Reactive level 1 lymph nodes measure up to 14 mm short axis (left level 1 B station). No cystic or necrotic nodes. The visible  major vascular structures in the neck and at the skull base are patent. Limited intracranial: Negative, see also head CT today reported separately. IMPRESSION: 1. Extensive dental caries with odontogenic Cellulitis around the body of the left mandible. Small 10 mm subperiosteal abscess overlying the carious left anterior mandible molar. Reactive level 1 lymphadenopathy. 2. No other acute finding.  Mildly degraded by motion. Electronically Signed   By: Odessa Fleming M.D.   On: 09/30/2019 16:44   DG Abd 2 Views  Result Date: 09/30/2019 CLINICAL DATA:  NAUSEA AND VOMITING FOR 4 DAYS EXAM: X-RAY ABDOMEN 2 VIEWS COMPARISON:  09/04/2019 FINDINGS: Supine and upright frontal views of the abdomen and pelvis demonstrate a nonobstructive bowel gas pattern. There is a paucity of bowel gas. Minimal stool within the distal colon. No masses or abnormal calcifications. No free gas in the greater peritoneal sac. IMPRESSION: 1. No evidence of bowel obstruction or ileus.  Paucity of bowel gas. Electronically Signed   By: Sharlet Salina M.D.   On: 09/30/2019 15:37   DG Abd 2 Views  Result Date: 09/04/2019 CLINICAL DATA:  Nausea, vomiting, and abdominal pain for 2 weeks. EXAM: ABDOMEN - 2 VIEW COMPARISON:  September 03, 2019 and August 30, 2019 FINDINGS: A few air-fluid levels are seen throughout the colon. No small bowel dilatation. No other abnormalities. No free air, portal venous gas, or pneumatosis. No renal or ureteral stones identified. No other acute abnormalities. IMPRESSION: A few air-fluid levels throughout the colon suggest the possibility of diarrhea. No colonic wall thickening or dilatation noted. No obstruction. Electronically Signed   By: Gerome Sam III M.D   On: 09/04/2019 10:37     Subjective: Feeling better. Tolerating soft diet. Eager to go home  Discharge Exam: Vitals:   10/03/19 0529 10/03/19 0837  BP: 118/79 122/71  Pulse: 94 (!) 110  Resp: 17 18  Temp: 97.8 F (36.6 C) 98 F (36.7 C)  SpO2:  100% 100%   Vitals:   10/02/19 1614 10/02/19 2015 10/03/19 0529 10/03/19 0837  BP: (!) 147/105 (!) 146/101 118/79 122/71  Pulse: 87 87 94 (!) 110  Resp: 18 17 17 18   Temp: 98.7 F (37.1 C) 98 F (36.7 C) 97.8 F (36.6 C) 98 F (36.7 C)  TempSrc: Oral  Oral Oral  SpO2: 100% 100% 100% 100%  Weight:  94.9 kg    Height:        General: Pt is alert, awake, not in acute distress Cardiovascular: RRR, S1/S2 +, no rubs, no gallops Respiratory: CTA bilaterally, no wheezing, no rhonchi Abdominal: Soft, NT, ND, bowel sounds + Extremities: no edema, no cyanosis   The results of significant diagnostics from this hospitalization (including imaging, microbiology, ancillary and laboratory) are listed below for reference.     Microbiology: Recent Results (from the past 240 hour(s))  Respiratory Panel by RT PCR (Flu A&B, Covid) - Nasopharyngeal Swab     Status: None   Collection Time: 09/30/19  8:34 PM   Specimen: Nasopharyngeal Swab  Result Value  Ref Range Status   SARS Coronavirus 2 by RT PCR NEGATIVE NEGATIVE Final    Comment: (NOTE) SARS-CoV-2 target nucleic acids are NOT DETECTED.  The SARS-CoV-2 RNA is generally detectable in upper respiratoy specimens during the acute phase of infection. The lowest concentration of SARS-CoV-2 viral copies this assay can detect is 131 copies/mL. A negative result does not preclude SARS-Cov-2 infection and should not be used as the sole basis for treatment or other patient management decisions. A negative result may occur with  improper specimen collection/handling, submission of specimen other than nasopharyngeal swab, presence of viral mutation(s) within the areas targeted by this assay, and inadequate number of viral copies (<131 copies/mL). A negative result must be combined with clinical observations, patient history, and epidemiological information. The expected result is Negative.  Fact Sheet for Patients:   https://www.moore.com/  Fact Sheet for Healthcare Providers:  https://www.young.biz/  This test is no t yet approved or cleared by the [place] FDA and  has been authorized for detection and/or diagnosis of SARS-CoV-2 by FDA under an Emergency Use Authorization (EUA). This EUA will remain  in effect (meaning this test can be used) for the duration of the COVID-19 declaration under Section 564(b)(1) of the Act, 21 U.S.C. section 360bbb-3(b)(1), unless the authorization is terminated or revoked sooner.     Influenza A by PCR NEGATIVE NEGATIVE Final   Influenza B by PCR NEGATIVE NEGATIVE Final    Comment: (NOTE) The Xpert Xpress SARS-CoV-2/FLU/RSV assay is intended as an aid in  the diagnosis of influenza from Nasopharyngeal swab specimens and  should not be used as a sole basis for treatment. Nasal washings and  aspirates are unacceptable for Xpert Xpress SARS-CoV-2/FLU/RSV  testing.  Fact Sheet for Patients: https://www.moore.com/  Fact Sheet for Healthcare Providers: https://www.young.biz/  This test is not yet approved or cleared by the [place] FDA and  has been authorized for detection and/or diagnosis of SARS-CoV-2 by  FDA under an Emergency Use Authorization (EUA). This EUA will remain  in effect (meaning this test can be used) for the duration of the  Covid-19 declaration under Section 564(b)(1) of the Act, 21  U.S.C. section 360bbb-3(b)(1), unless the authorization is  terminated or revoked. Performed at [facility], [address], [place], [place] [address]      Labs: BNP (last 3 results) No results for input(s): BNP in the last 8760 hours. Basic Metabolic Panel: Recent Labs  Lab 09/30/19 1120 10/01/19 0349 10/02/19 1535 10/03/19 0523  NA 137 135 136 137  K 4.3 3.1* 3.3* 3.1*  CL 100 105 105 105  CO2 28 23 22 24   GLUCOSE 367* 254* 143* 220*  BUN 5* 5* <5* <5*   CREATININE 0.62 0.60 0.49 0.63  CALCIUM 9.1 8.0* 8.5* 8.4*  MG  --   --  1.9  --    Liver Function Tests: Recent Labs  Lab 10/01/19 0349 10/02/19 1535 10/03/19 0523  AST 15 14* 12*  ALT 14 15 14   ALKPHOS 98 97 89  BILITOT <0.1* 0.4 0.4  PROT 5.5* 6.2* 5.8*  ALBUMIN 2.8* 3.1* 2.9*   No results for input(s): LIPASE, AMYLASE in the last 168 hours. No results for input(s): AMMONIA in the last 168 hours. CBC: Recent Labs  Lab 09/30/19 1120 10/01/19 0349 10/02/19 1535 10/03/19 0523  WBC 9.2 14.2* 10.3 10.7*  HGB 11.6* 9.6* 11.2* 10.8*  HCT 37.8 31.1* 36.0 34.1*  MCV 80.4 80.2 79.3* 80.2  PLT 303 294 355  344   Cardiac Enzymes: No results for input(s): CKTOTAL, CKMB, CKMBINDEX, TROPONINI in the last 168 hours. BNP: Invalid input(s): POCBNP CBG: Recent Labs  Lab 10/02/19 1150 10/02/19 1616 10/02/19 2027 10/03/19 0653 10/03/19 1113  GLUCAP 164* 147* 111* 135* 106*   D-Dimer No results for input(s): DDIMER in the last 72 hours. Hgb A1c No results for input(s): HGBA1C in the last 72 hours. Lipid Profile No results for input(s): CHOL, HDL, LDLCALC, TRIG, CHOLHDL, LDLDIRECT in the last 72 hours. Thyroid function studies Recent Labs    10/01/19 0349  TSH 0.098*   Anemia work up No results for input(s): VITAMINB12, FOLATE, FERRITIN, TIBC, IRON, RETICCTPCT in the last 72 hours. Urinalysis    Component Value Date/Time   COLORURINE YELLOW 10/01/2019 1411   APPEARANCEUR HAZY (A) 10/01/2019 1411   LABSPEC 1.018 10/01/2019 1411   PHURINE 6.0 10/01/2019 1411   GLUCOSEU >=500 (A) 10/01/2019 1411   HGBUR NEGATIVE 10/01/2019 1411   BILIRUBINUR NEGATIVE 10/01/2019 1411   BILIRUBINUR negative 07/13/2018 1651   KETONESUR NEGATIVE 10/01/2019 1411   PROTEINUR NEGATIVE 10/01/2019 1411   UROBILINOGEN 0.2 07/13/2018 1651   UROBILINOGEN 1.0 07/09/2017 1324   NITRITE NEGATIVE 10/01/2019 1411   LEUKOCYTESUR TRACE (A) 10/01/2019 1411   Sepsis Labs Invalid input(s):  PROCALCITONIN,  WBC,  LACTICIDVEN Microbiology Recent Results (from the past 240 hour(s))  Respiratory Panel by RT PCR (Flu A&B, Covid) - Nasopharyngeal Swab     Status: None   Collection Time: 09/30/19  8:34 PM   Specimen: Nasopharyngeal Swab  Result Value Ref Range Status   SARS Coronavirus 2 by RT PCR NEGATIVE NEGATIVE Final    Comment: (NOTE) SARS-CoV-2 target nucleic acids are NOT DETECTED.  The SARS-CoV-2 RNA is generally detectable in upper respiratoy specimens during the acute phase of infection. The lowest concentration of SARS-CoV-2 viral copies this assay can detect is 131 copies/mL. A negative result does not preclude SARS-Cov-2 infection and should not be used as the sole basis for treatment or other patient management decisions. A negative result may occur with  improper specimen collection/handling, submission of specimen other than nasopharyngeal swab, presence of viral mutation(s) within the areas targeted by this assay, and inadequate number of viral copies (<131 copies/mL). A negative result must be combined with clinical observations, patient history, and epidemiological information. The expected result is Negative.  Fact Sheet for Patients:  https://www.moore.com/  Fact Sheet for Healthcare Providers:  https://www.young.biz/  This test is no t yet approved or cleared by the [place] FDA and  has been authorized for detection and/or diagnosis of SARS-CoV-2 by FDA under an Emergency Use Authorization (EUA). This EUA will remain  in effect (meaning this test can be used) for the duration of the COVID-19 declaration under Section 564(b)(1) of the Act, 21 U.S.C. section 360bbb-3(b)(1), unless the authorization is terminated or revoked sooner.     Influenza A by PCR NEGATIVE NEGATIVE Final   Influenza B by PCR NEGATIVE NEGATIVE Final    Comment: (NOTE) The Xpert Xpress SARS-CoV-2/FLU/RSV assay is intended as an aid in   the diagnosis of influenza from Nasopharyngeal swab specimens and  should not be used as a sole basis for treatment. Nasal washings and  aspirates are unacceptable for Xpert Xpress SARS-CoV-2/FLU/RSV  testing.  Fact Sheet for Patients: https://www.moore.com/  Fact Sheet for Healthcare Providers: https://www.young.biz/  This test is not yet approved or cleared by the [place] FDA and  has been authorized for detection and/or diagnosis of SARS-CoV-2 by  FDA under an Emergency Use Authorization (EUA). This EUA will remain  in effect (meaning this test can be used) for the duration of the  Covid-19 declaration under Section 564(b)(1) of the Act, 21  U.S.C. section 360bbb-3(b)(1), unless the authorization is  terminated or revoked. Performed at [facility], [address] [place], [place] [address]    Time spent: 30 min  SIGNED:   Rickey Barbara, MD  [facility] 10/03/2019, 12:28 PM  If 7PM-7AM, please contact night-coverage

## 2019-10-08 IMAGING — CT CT ABD-PELV W/ CM
2 of 4 series · 16 of 46 positions shown, 18 images · IV contrast (Omni 300)
Comparison: CT abdomen and pelvis 07/25/2018.

CLINICAL DATA: Syncope in general abdominal pain today. The patient
reports coffee ground emesis.

EXAM:
CT ABDOMEN AND PELVIS WITH CONTRAST
TECHNIQUE: Multidetector CT imaging of the abdomen and pelvis was performed
using the standard protocol following bolus administration of
intravenous contrast.
CONTRAST:  100 mL OMNIPAQUE IOHEXOL 300 MG/ML  SOLN

[Series 3: a/p w/ 5mm · axial · 0.82mm/px · z∈[-372,+63]mm · 13 of 95 slices shown, 15 images]
[im 4/95  soft-tissue]
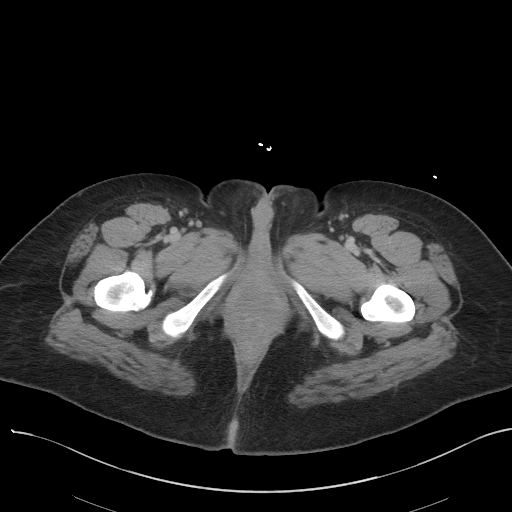
[im 4/95  bone]
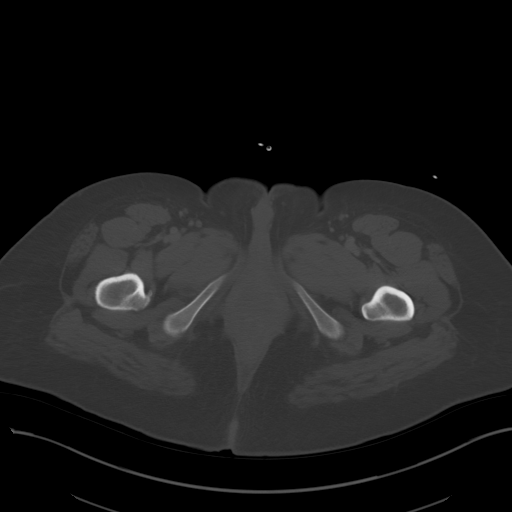
[im 12/95  soft-tissue]
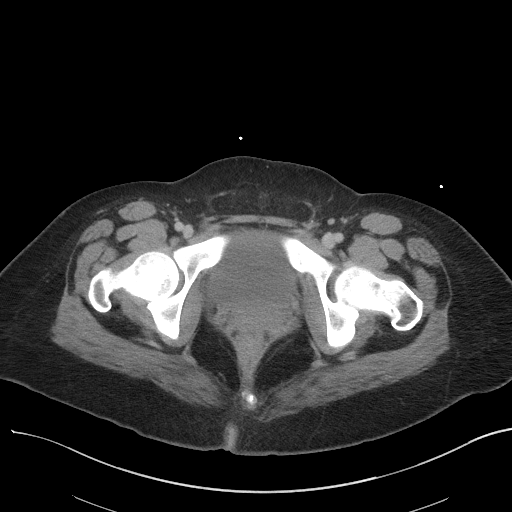
[im 20/95  soft-tissue]
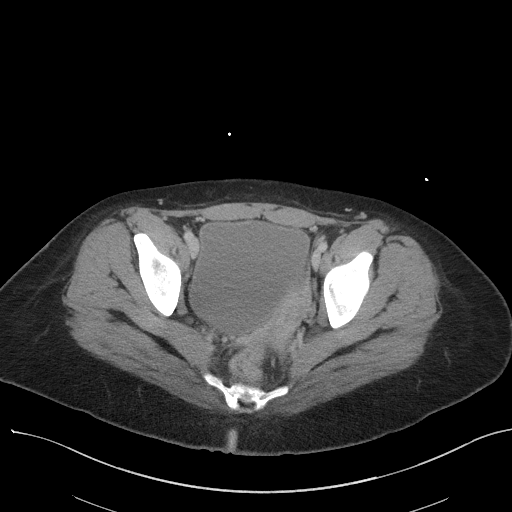
[im 28/95  soft-tissue]
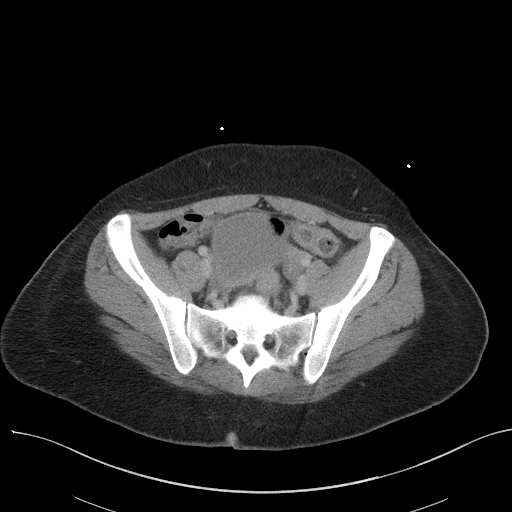
[im 32/95  soft-tissue]
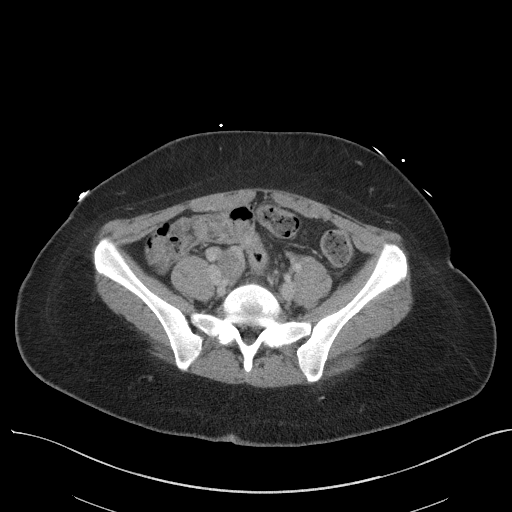
[im 40/95  soft-tissue]
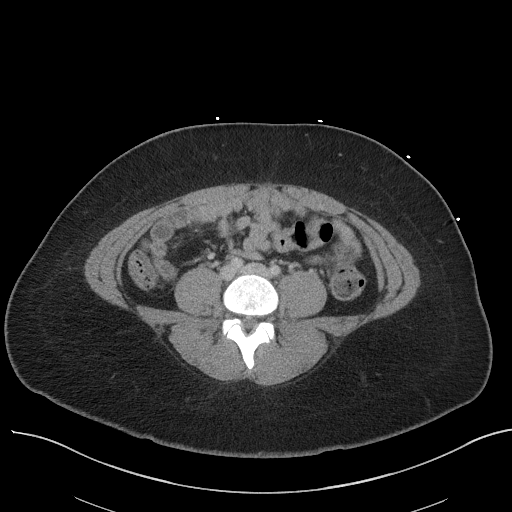
[im 48/95  soft-tissue]
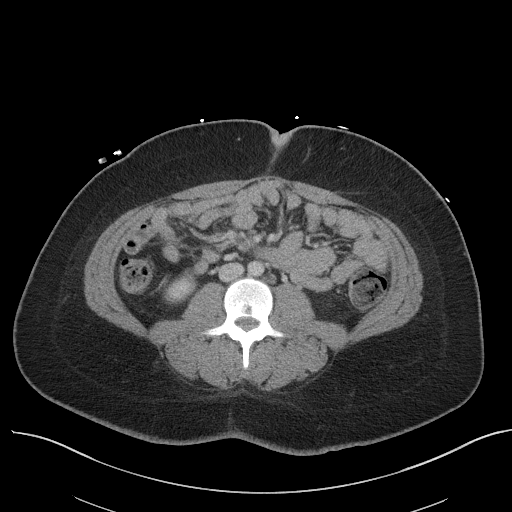
[im 55/95  soft-tissue]
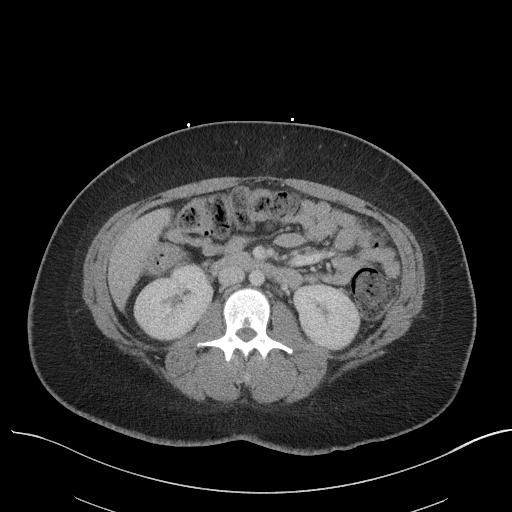
[im 63/95  soft-tissue]
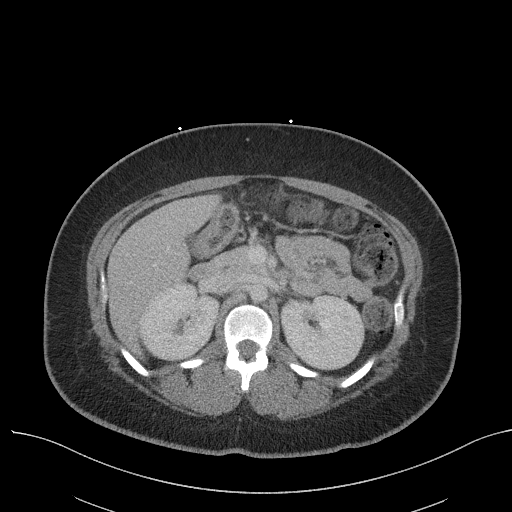
[im 63/95  bone]
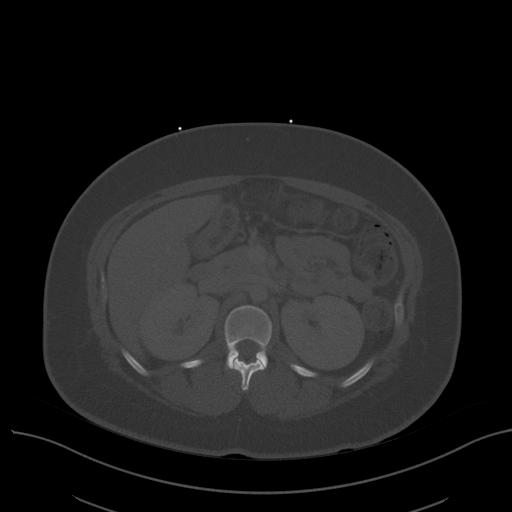
[im 67/95  soft-tissue]
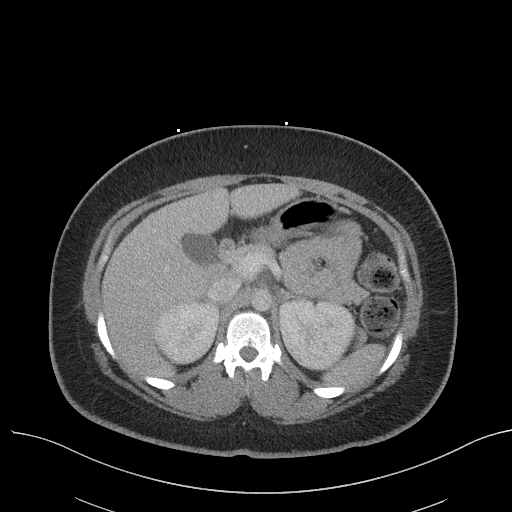
[im 75/95  soft-tissue]
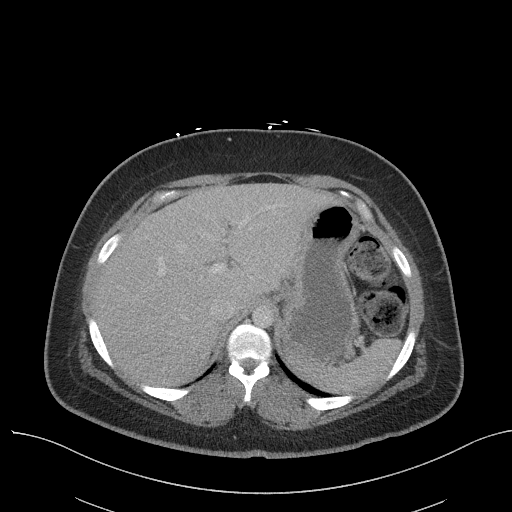
[im 83/95  soft-tissue]
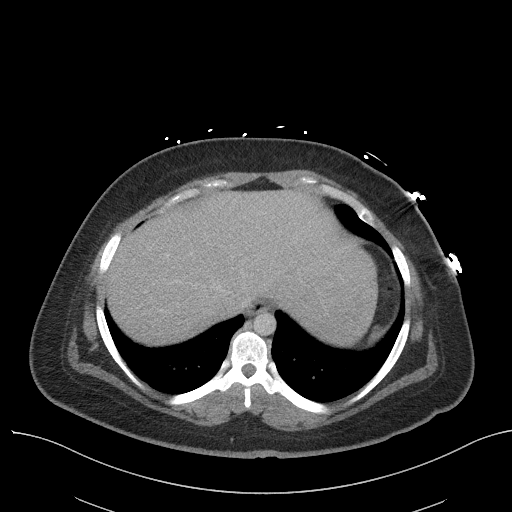
[im 91/95  soft-tissue]
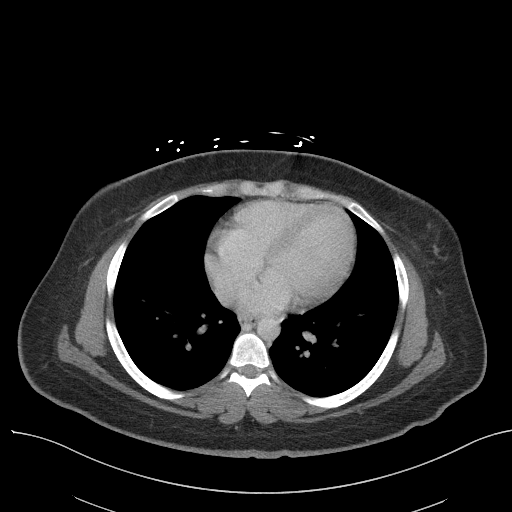

[Series 6: a/p w/ cor · coronal · 0.72mm/px · 3 of 137 slices shown]
[im 46/137  soft-tissue]
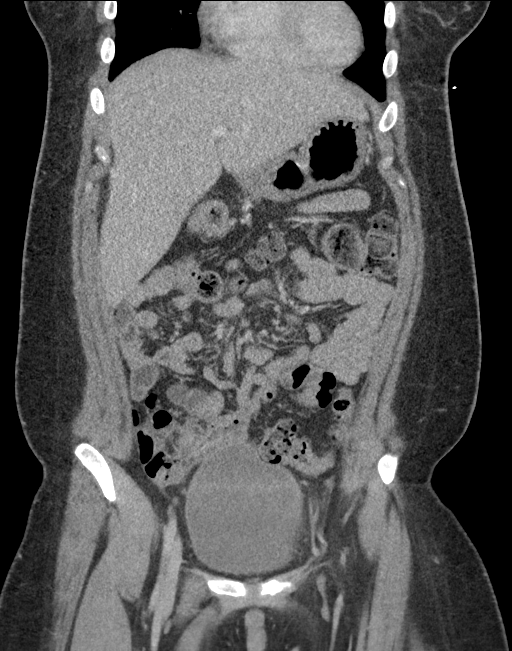
[im 61/137  soft-tissue]
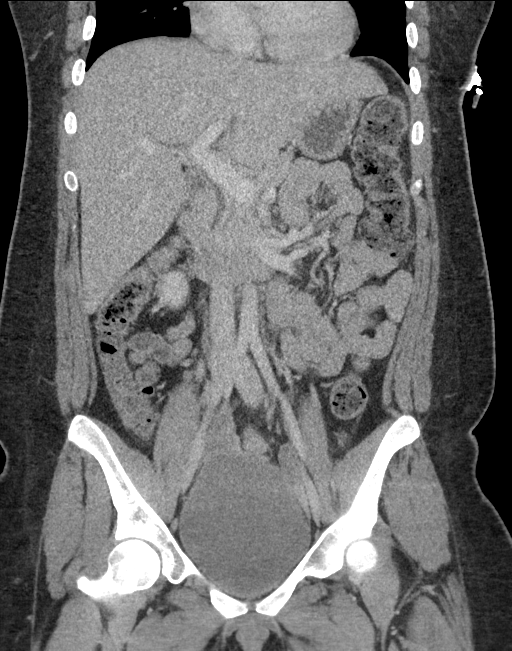
[im 76/137  soft-tissue]
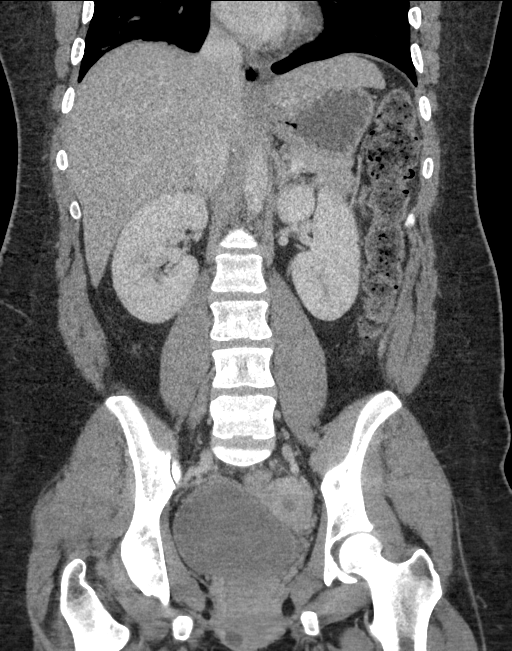

[16 of 46 positions shown; findings below may reference images not displayed]

FINDINGS: Lower chest: Lung bases clear.  No pleural or pericardial effusion.

Hepatobiliary: No focal liver abnormality is seen. No gallstones,
gallbladder wall thickening, or biliary dilatation.

Pancreas: Unremarkable. No pancreatic ductal dilatation or
surrounding inflammatory changes.

Spleen: Normal in size without focal abnormality.

Adrenals/Urinary Tract: Adrenal glands are unremarkable. Kidneys are
normal, without renal calculi, focal lesion, or hydronephrosis.
Bladder is unremarkable.

Stomach/Bowel: Adrenal glands are unremarkable. Kidneys are normal,
without renal calculi, focal lesion, or hydronephrosis. Bladder is
unremarkable.

Vascular/Lymphatic: No significant vascular findings are present. No
enlarged abdominal or pelvic lymph nodes.

Reproductive: Uterus and bilateral adnexa are unremarkable. A cystic
lesion in the superior aspect of the vagina on the right measuring
1.8 cm AP x 1.3 cm transverse x 0.9 cm craniocaudal is unchanged.

Other: None.

Musculoskeletal: Negative.
IMPRESSION: No acute abnormality abdomen or pelvis. No finding to explain the
patient's symptoms.

Small cystic lesion in the right wall of the vagina is likely a
Gartner duct cyst and unchanged.

## 2019-10-19 ENCOUNTER — Emergency Department (HOSPITAL_COMMUNITY)
Admission: EM | Admit: 2019-10-19 | Discharge: 2019-10-19 | Disposition: A | Discharge status: Home / Self Care | Payer: Medicaid Other | Attending: Emergency Medicine | Admitting: Emergency Medicine

## 2019-10-19 ENCOUNTER — Emergency Department (HOSPITAL_COMMUNITY): Payer: Medicaid Other

## 2019-10-19 ENCOUNTER — Encounter (HOSPITAL_COMMUNITY): Payer: Self-pay

## 2019-10-19 DIAGNOSIS — Z794 Long term (current) use of insulin: Secondary | ICD-10-CM | POA: Insufficient documentation

## 2019-10-19 DIAGNOSIS — Z79899 Other long term (current) drug therapy: Secondary | ICD-10-CM | POA: Diagnosis not present

## 2019-10-19 DIAGNOSIS — R569 Unspecified convulsions: Secondary | ICD-10-CM | POA: Diagnosis not present

## 2019-10-19 DIAGNOSIS — I509 Heart failure, unspecified: Secondary | ICD-10-CM | POA: Diagnosis not present

## 2019-10-19 DIAGNOSIS — I110 Hypertensive heart disease with heart failure: Secondary | ICD-10-CM | POA: Diagnosis not present

## 2019-10-19 DIAGNOSIS — R739 Hyperglycemia, unspecified: Secondary | ICD-10-CM

## 2019-10-19 DIAGNOSIS — Z955 Presence of coronary angioplasty implant and graft: Secondary | ICD-10-CM | POA: Insufficient documentation

## 2019-10-19 DIAGNOSIS — N390 Urinary tract infection, site not specified: Secondary | ICD-10-CM | POA: Insufficient documentation

## 2019-10-19 DIAGNOSIS — E1165 Type 2 diabetes mellitus with hyperglycemia: Secondary | ICD-10-CM | POA: Diagnosis not present

## 2019-10-19 DIAGNOSIS — E1140 Type 2 diabetes mellitus with diabetic neuropathy, unspecified: Secondary | ICD-10-CM | POA: Insufficient documentation

## 2019-10-19 DIAGNOSIS — R1084 Generalized abdominal pain: Secondary | ICD-10-CM

## 2019-10-19 LAB — URINALYSIS, ROUTINE W REFLEX MICROSCOPIC
Bilirubin Urine: NEGATIVE
Glucose, UA: >=500 mg/dL — AB
Hgb urine dipstick: NEGATIVE
Ketones, ur: 5 mg/dL — AB
Nitrite: POSITIVE — AB
Protein, ur: NEGATIVE mg/dL
Specific Gravity, Urine: 1.038 — ABNORMAL HIGH (ref 1.005–1.030)
pH: 7 (ref 5.0–8.0)

## 2019-10-19 LAB — MAGNESIUM: Magnesium: 2 mg/dL (ref 1.7–2.4)

## 2019-10-19 LAB — CBG MONITORING, ED
Glucose-Capillary: 437 mg/dL — ABNORMAL HIGH (ref 70–99)
Glucose-Capillary: 294 mg/dL — ABNORMAL HIGH (ref 70–99)

## 2019-10-19 LAB — CBC WITH DIFFERENTIAL/PLATELET
Abs Immature Granulocytes: 0.02 10*3/uL (ref 0.00–0.07)
Basophils Absolute: 0.1 10*3/uL (ref 0.0–0.1)
Basophils Relative: 1 %
Eosinophils Absolute: 0.1 10*3/uL (ref 0.0–0.5)
Eosinophils Relative: 2 %
HCT: 36.4 % (ref 36.0–46.0)
Hemoglobin: 11.6 g/dL — ABNORMAL LOW (ref 12.0–15.0)
Immature Granulocytes: 0 %
Lymphocytes Relative: 28 %
Lymphs Abs: 2 10*3/uL (ref 0.7–4.0)
MCH: 25.7 pg — ABNORMAL LOW (ref 26.0–34.0)
MCHC: 31.9 g/dL (ref 30.0–36.0)
MCV: 80.7 fL (ref 80.0–100.0)
Monocytes Absolute: 0.6 10*3/uL (ref 0.1–1.0)
Monocytes Relative: 9 %
Neutro Abs: 4.3 10*3/uL (ref 1.7–7.7)
Neutrophils Relative %: 60 %
Platelets: 312 10*3/uL (ref 150–400)
RBC: 4.51 MIL/uL (ref 3.87–5.11)
RDW: 14.7 % (ref 11.5–15.5)
WBC: 7.1 10*3/uL (ref 4.0–10.5)
nRBC: 0 % (ref 0.0–0.2)

## 2019-10-19 LAB — COMPREHENSIVE METABOLIC PANEL
ALT: 12 U/L (ref 0–44)
AST: 10 U/L — ABNORMAL LOW (ref 15–41)
Albumin: 3.5 g/dL (ref 3.5–5.0)
Alkaline Phosphatase: 79 U/L (ref 38–126)
Anion gap: 12 (ref 5–15)
BUN: <5 mg/dL — ABNORMAL LOW (ref 6–20)
CO2: 25 mmol/L (ref 22–32)
Calcium: 8.3 mg/dL — ABNORMAL LOW (ref 8.9–10.3)
Chloride: 101 mmol/L (ref 98–111)
Creatinine, Ser: 0.58 mg/dL (ref 0.44–1.00)
GFR, Estimated: >60 mL/min (ref >60)
Glucose, Bld: 399 mg/dL — ABNORMAL HIGH (ref 70–99)
Potassium: 3.4 mmol/L — ABNORMAL LOW (ref 3.5–5.1)
Sodium: 138 mmol/L (ref 135–145)
Total Bilirubin: 0.7 mg/dL (ref 0.3–1.2)
Total Protein: 6.5 g/dL (ref 6.5–8.1)

## 2019-10-19 LAB — LIPASE, BLOOD: Lipase: 21 U/L (ref 11–51)

## 2019-10-19 LAB — I-STAT BETA HCG BLOOD, ED (MC, WL, AP ONLY): I-stat hCG, quantitative: <5 m[IU]/mL (ref <5)

## 2019-10-19 IMAGING — DX DG CHEST 1V PORT
1 series · 1 of 1 positions shown · non-contrast
Comparison: 09/13/2018.

CLINICAL DATA: Chest pain.

EXAM:
PORTABLE CHEST 1 VIEW

[chest ap]
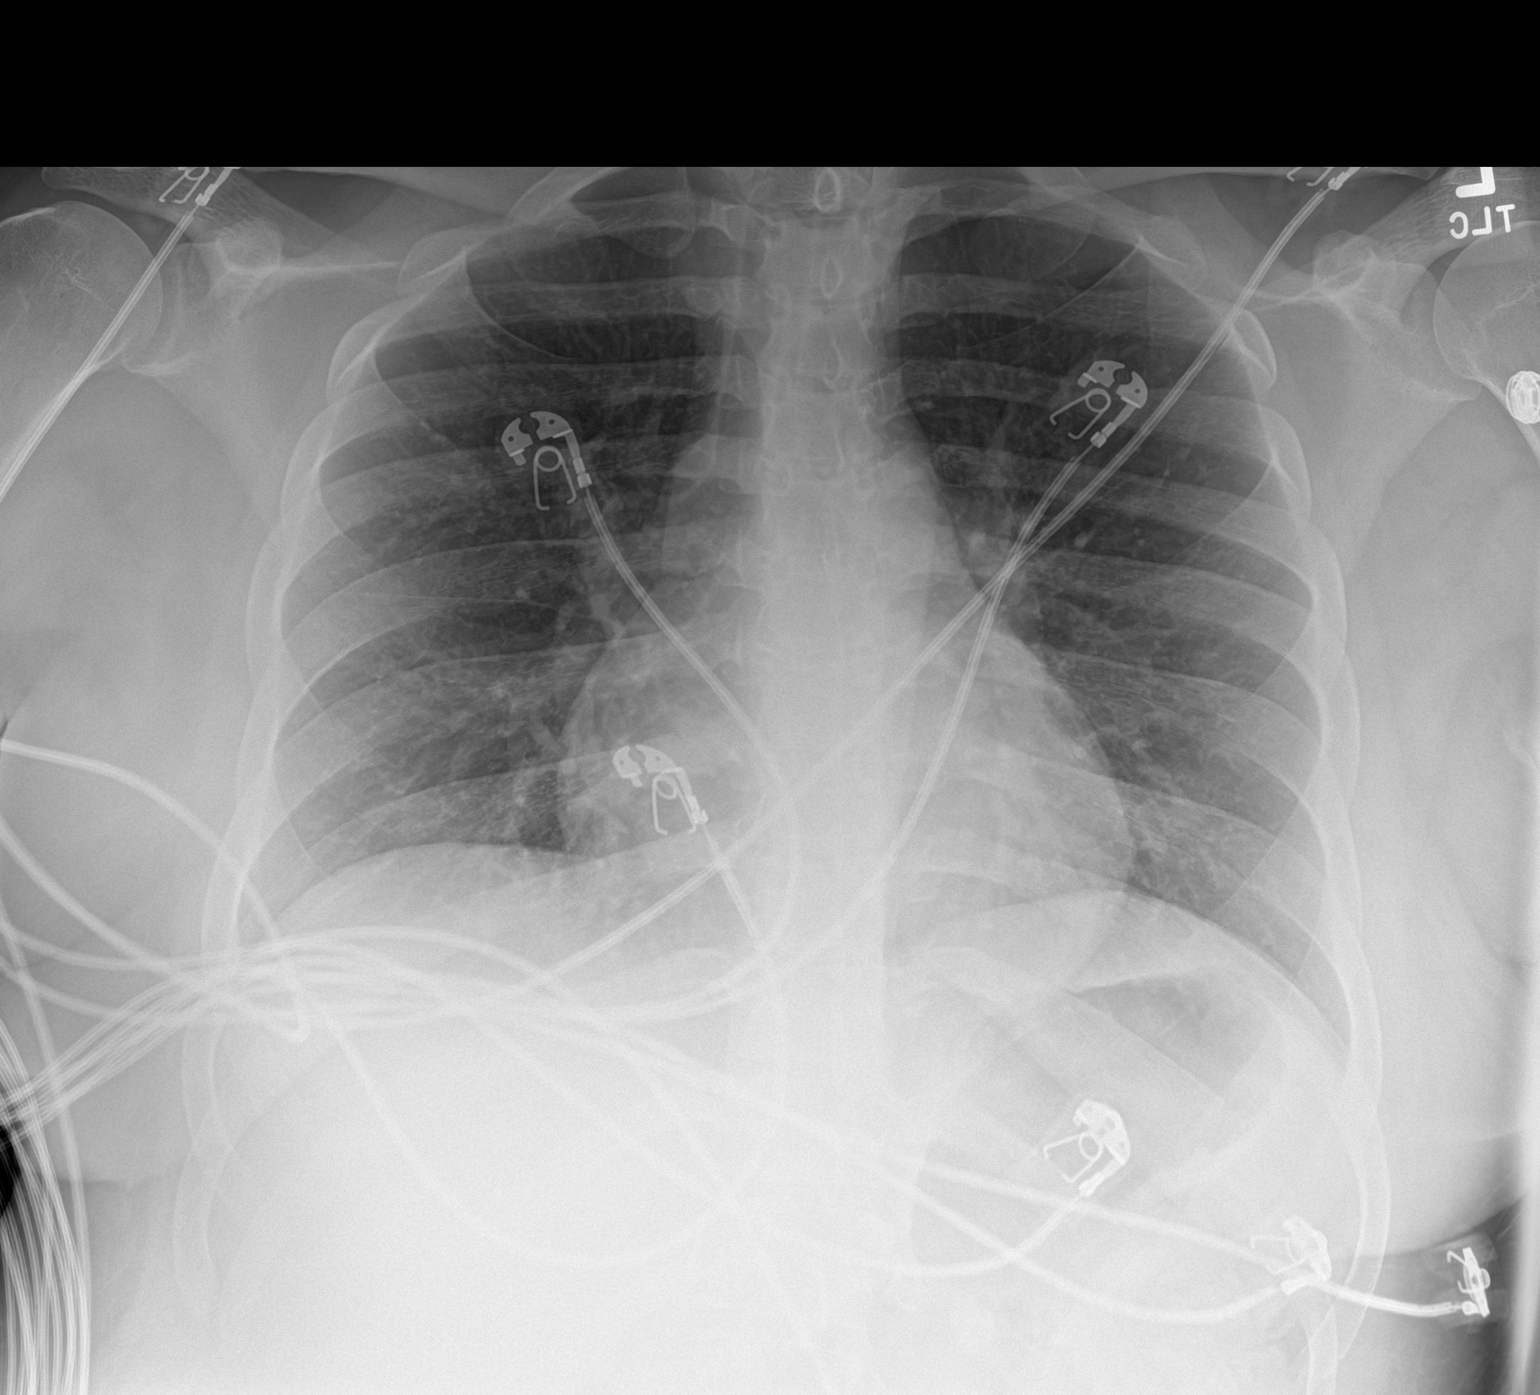

[1 of 1 positions shown; findings below may reference images not displayed]

FINDINGS: Mediastinum and hilar structures normal. Lungs are clear. No pleural
effusion or pneumothorax. Heart size normal. No acute bony
abnormality.
IMPRESSION: No acute cardiopulmonary disease.

## 2019-10-19 MED ORDER — SORBITOL 70 % SOLN
960.0000 mL | TOPICAL_OIL | Freq: Once | ORAL | Status: AC
  Administered 2019-10-19: 960 mL via RECTAL
  Filled 2019-10-19: qty 473

## 2019-10-19 MED ORDER — SODIUM CHLORIDE 0.9 % IV BOLUS
1000.0000 mL | Freq: Once | INTRAVENOUS | Status: AC
  Administered 2019-10-19: 1000 mL via INTRAVENOUS

## 2019-10-19 MED ORDER — MORPHINE SULFATE (PF) 4 MG/ML IV SOLN
4.0000 mg | Freq: Once | INTRAVENOUS | Status: AC
  Administered 2019-10-19: 4 mg via INTRAVENOUS
  Filled 2019-10-19: qty 1

## 2019-10-19 MED ORDER — SODIUM CHLORIDE 0.9 % IV SOLN
1.0000 g | Freq: Once | INTRAVENOUS | Status: AC
  Administered 2019-10-19: 1 g via INTRAVENOUS
  Filled 2019-10-19: qty 10

## 2019-10-19 MED ORDER — FENTANYL CITRATE (PF) 100 MCG/2ML IJ SOLN
50.0000 ug | Freq: Once | INTRAMUSCULAR | Status: AC
  Administered 2019-10-19: 50 ug via INTRAVENOUS
  Filled 2019-10-19: qty 2

## 2019-10-19 NOTE — ED Notes (Signed)
Pt able to ambulate without assistance. Steady gait noted

## 2019-10-19 NOTE — Discharge Instructions (Addendum)
Take your amoxicillin as prescribed.

## 2019-10-19 NOTE — ED Notes (Signed)
Pt tolerated PO fluids with no vomiting

## 2019-10-19 NOTE — ED Notes (Signed)
Pt able to have bowel movement and states that abdominal pain is subsiding

## 2019-10-19 NOTE — ED Triage Notes (Signed)
Pt had 6-7 seizures this am. States has been taking diabetic and seizure meds. Rescue states hyperglycemia 409. CBG 437 on arrival/ N/V?abd pain x 2 days. Pt states emesis contains blood. EMS giave pt Fentanyl en route

## 2019-10-19 NOTE — ED Provider Notes (Signed)
[facility] Note   CSN: [id number] Arrival date & time: 10/19/19  0827     History Chief Complaint  Patient presents with  . Abdominal Pain  . Seizures  . Hyperglycemia    Nancy [name] is a 19 y.o. female.  The history is provided by the patient and medical records.  Abdominal Pain Seizures Hyperglycemia Associated symptoms: abdominal pain    Nancy [name] is a 19 y.o. female who presents to the Emergency Department complaining of abdominal pain, seizures and hyperglycemia. She presents to the emergency department complaining of five episodes of seizures this morning. She states that episodes have occurred while walking and while sitting. She states that she will be doing her routine activities and then she developed generalized shaking activity, urinate on herself. She complains of severe pain to her left jaw after striking it on an object. She also has pain to her left hip and thigh. She takes Lamictal for history of seizure disorder. No missed doses. She reports numerous episodes of emesis since Monday. She describes her emesis is bright red in nature. She reports 20 or greater episodes of emesis daily. No fevers, chest pain, shortness of breath. She has generalized abdominal pain and states that this is chronic. She denies any dysuria or change in her urine quality. She does have constipation and reports her last bowel movement was during her last hospitalization. Symptoms are similar to multiple prior episodes.    Past Medical History:  Diagnosis Date  . Acanthosis nigricans   . Anxiety   . CHF (congestive heart failure) (HCC)   . Chronic lower back pain   . Depression   . DKA, type 1 (HCC) 09/13/2018  . Dyspepsia   . Obesity   . Ovarian cyst    pt is not aware of this hx (11/24/2017)  . 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 Premature baby   . Seizures (HCC)   . Type II diabetes mellitus (HCC)    insulin dependant     Patient Active Problem List   Diagnosis Date Noted  . Cyclical vomiting with nausea 10/01/2019  . Intractable nausea and vomiting 09/30/2019  . Cellulitis   . DKA (diabetic ketoacidoses) 01/27/2019  . Leukocytosis 12/19/2018  . Dehydration   . Pseudoseizures (HCC)   . Type 1 diabetes mellitus with ketoacidosis without coma (HCC) 11/28/2018  . Hyperglycemia due to diabetes mellitus (HCC) 11/27/2018  . 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  . MDD (major depressive disorder), recurrent episode, severe (HCC) 11/06/2018  . Nonspecific abnormal electrocardiogram (ECG) (EKG)   . Chest pain of uncertain etiology   . Hypertensive urgency 10/08/2018  . Conversion disorder with attacks or seizures, acute episode, with psychological stressor 09/16/2018  . MDD (major depressive disorder), recurrent severe, without psychosis (HCC)   . AKI (acute kidney injury) (HCC) 08/04/2018  . Seizure-like activity (HCC) 08/03/2018  . Depression 07/25/2018  . Syncope 01/30/2018  . Orthostatic hypotension 01/24/2018  . Tachycardia 12/28/2017  . Chronic abdominal pain 12/24/2017  . Chest pain 12/19/2017  . Nausea and vomiting 08/21/2017  . Generalized abdominal pain 08/21/2017  . Non compliance with medical treatment 01/27/2012  . Adjustment disorder 09/16/2011  . Acanthosis nigricans   . Goiter   . Obesity 06/14/2010  . Hypertension 06/14/2010    Past Surgical History:  Procedure Laterality Date  . ABDOMINAL HERNIA REPAIR     "I was a baby"  . BIOPSY  10/12/2018  Procedure: BIOPSY;  Surgeon: Lynann Bologna, MD;  Location: [facility] ENDOSCOPY;  Service: Endoscopy;;  . ESOPHAGOGASTRODUODENOSCOPY (EGD) WITH PROPOFOL N/A 10/12/2018   Procedure: ESOPHAGOGASTRODUODENOSCOPY (EGD) WITH PROPOFOL;  Surgeon: Lynann Bologna, MD;  Location[facility] ENDOSCOPY;  Service: Endoscopy;  Laterality: N/A;  . HERNIA REPAIR    . LEFT HEART CATH AND CORONARY ANGIOGRAPHY N/A 10/13/2018    Procedure: LEFT HEART CATH AND CORONARY ANGIOGRAPHY;  Surgeon: Kathleene Hazel, MD;  Location: [facility] INVASIVE CV LAB;  Service: Cardiovascular;  Laterality: N/A;  . TONSILLECTOMY AND ADENOIDECTOMY    . WISDOM TOOTH EXTRACTION  2017     OB History   No obstetric history on file.     Family History  Problem Relation Age of Onset  . Diabetes Mother   . Hypertension Mother   . Obesity Mother   . Asthma Mother   . Allergic rhinitis Mother   . Eczema Mother   . Cervical cancer Mother   . Diabetes Father   . Hypertension Father   . Obesity Father   . Hyperlipidemia Father   . Hypertension Paternal Aunt   . Hypertension Maternal Grandfather   . Colon cancer Maternal Grandfather   . Diabetes Paternal Grandmother   . Obesity Paternal Grandmother   . Diabetes Paternal Grandfather   . Obesity Paternal Grandfather   . Angioedema Neg Hx   . Immunodeficiency Neg Hx   . Urticaria Neg Hx   . Stomach cancer Neg Hx   . Esophageal cancer Neg Hx     Social History   Tobacco Use  . Smoking status: Never Smoker  . Smokeless tobacco: Never Used  Vaping Use  . Vaping Use: Never used  Substance Use Topics  . Alcohol use: No    Alcohol/week: 0.0 standard drinks  . Drug use: No    Home Medications Prior to Admission medications   Medication Sig Start Date End Date Taking? Authorizing Provider  ARIPiprazole (ABILIFY) 10 MG tablet Take 10 mg by mouth in the morning and at bedtime.    Yes [provider]  atorvastatin (LIPITOR) 20 MG tablet Take 1 tablet (20 mg total) by mouth daily at 6 PM. Patient taking differently: Take 20 mg by mouth in the morning and at bedtime.  10/05/18  Yes Storm Frisk, MD  busPIRone (BUSPAR) 10 MG tablet Take 10 mg by mouth 2 (two) times daily.  11/19/18  Yes [provider]  Cyanocobalamin (VITAMIN B-12 PO) Take 1 tablet by mouth daily. Chew one gummy daily    Yes [provider]  Diclofenac Sodium (VOLTAREN EX) Apply 1  application topically 2 (two) times daily as needed (chest pains).   Yes [provider]  dicyclomine (BENTYL) 10 MG capsule Take 10 mg by mouth 4 (four) times daily as needed for spasms.   Yes [provider]  famotidine (PEPCID) 40 MG tablet Take 40 mg by mouth 2 (two) times daily.   Yes [provider]  hydrOXYzine (ATARAX/VISTARIL) 50 MG tablet Take 50 mg by mouth 2 (two) times daily.   Yes [provider]  insulin aspart (NOVOLOG FLEXPEN) 100 UNIT/ML FlexPen Inject 15 Units into the skin daily. Only takes once a day bc she only eats once a day   Yes [provider]  lamoTRIgine (LAMICTAL) 25 MG tablet Take 2 tablets (50 mg total) by mouth 2 (two) times daily. For mood stabilization Patient taking differently: Take 25 mg by mouth in the morning, at noon, and at bedtime. For  mood stabilization 11/11/18  Yes Nwoko, Nicole Kindred I, NP  LANTUS SOLOSTAR 100 UNIT/ML Solostar Pen Inject 70 Units into the skin daily.  07/05/19  Yes [provider]  lidocaine (LIDODERM) 5 % Place 1 patch onto the skin daily as needed (pain). Remove & Discard patch within 12 hours or as directed by MD   Yes [provider]  metoCLOPramide (REGLAN) 5 MG tablet Take 1 tablet (5 mg total) by mouth 3 (three) times daily. 10/03/19  Yes Jerald Kief, MD  mirtazapine (REMERON) 7.5 MG tablet Take 1 tablet (7.5 mg total) by mouth at bedtime. For depression/sleep 11/11/18  Yes Armandina Stammer I, NP  Multiple Vitamin (MULTIVITAMIN WITH MINERALS) TABS tablet Take 1 tablet by mouth daily. 09/18/18  Yes Clapacs, Jackquline Denmark, MD  oxyCODONE (OXYCONTIN) 20 mg 12 hr tablet Take 20 mg by mouth in the morning and at bedtime.    Yes [provider]  pantoprazole (PROTONIX) 40 MG tablet Take 1 tablet (40 mg total) by mouth 2 (two) times daily. For acid reflux 01/29/19  Yes Jae Dire, MD  prochlorperazine (COMPAZINE) 25 MG suppository Place 1 suppository (25 mg total) rectally every 12  (twelve) hours as needed for nausea or vomiting. 12/05/18  Yes Melene Plan, DO  propranolol (INDERAL) 20 MG tablet Take 1 tablet (20 mg total) by mouth 2 (two) times daily. For anxiety/HTN 12/23/18  Yes Swayze, Ava, DO  QUEtiapine (SEROQUEL) 100 MG tablet Take 300 mg by mouth at bedtime. 07/05/19  Yes [provider]  QUEtiapine (SEROQUEL) 50 MG tablet Take 50 mg by mouth daily.    Yes [provider]  sucralfate (CARAFATE) 1 GM/10ML suspension Take 1 g by mouth 4 (four) times daily -  with meals and at bedtime.  07/05/19  Yes [provider]  topiramate (TOPAMAX) 25 MG tablet Take 1 tablet (25 mg total) by mouth 2 (two) times daily. For seizure activities 11/11/18  Yes Nwoko, Nicole Kindred I, NP  Caraway Oil-Levomenthol (FDGARD) 25-20.75 MG CAPS Take 2 capsules by mouth as needed. Take as directed as needed Patient not taking: Reported on 10/19/2019 03/21/19   Armbruster, Willaim Rayas, MD  lubiprostone (AMITIZA) 24 MCG capsule Take 1 capsule (24 mcg total) by mouth 2 (two) times daily with a meal. Patient not taking: Reported on 10/19/2019 10/27/18   Esterwood, Amy S, PA-C  polyethylene glycol (MIRALAX / GLYCOLAX) 17 g packet Take 17 g by mouth daily. Patient not taking: Reported on 10/19/2019 10/03/19   Jerald Kief, MD    Allergies    Ibuprofen, Tomato, and Oatmeal  Review of Systems   Review of Systems  Gastrointestinal: Positive for abdominal pain.  Neurological: Positive for seizures.  All other systems reviewed and are negative.   Physical Exam Updated Vital Signs BP 127/77   Pulse 88   Temp 98 F (36.7 C) (Oral)   Resp 18   LMP 09/16/2019   SpO2 99%   Physical Exam Vitals and nursing note reviewed.  Constitutional:      Appearance: She is well-developed.  HENT:     Head: Normocephalic.     Comments: There is swelling and tenderness to the left mid mandible. There is fullness of the buccal mucosa of the left mid mandible with local tenderness. There is no  fluctuance. No trismus. Cardiovascular:     Rate and Rhythm: Normal rate and regular rhythm.     Heart sounds: No murmur heard.   Pulmonary:     Effort:  Pulmonary effort is normal. No respiratory distress.     Breath sounds: Normal breath sounds.  Abdominal:     Palpations: Abdomen is soft.     Tenderness: There is no guarding or rebound.     Comments: Mild generalized abdominal tenderness  Musculoskeletal:     Comments: There is tenderness to palpation over the left hip and proximal thigh. Range of motion decreased in the left hip secondary to pain.  Skin:    General: Skin is warm and dry.  Neurological:     Mental Status: She is alert and oriented to person, place, and time.     Comments: Five out of five strength in all four extremities with sensation to light touch intact in all four extremities.  Psychiatric:        Behavior: Behavior normal.     ED Results / Procedures / Treatments   Labs (all labs ordered are listed, but only abnormal results are displayed) Labs Reviewed  COMPREHENSIVE METABOLIC PANEL - Abnormal; Notable for the following components:      Result Value   Potassium 3.4 (*)    Glucose, Bld 399 (*)    BUN <5 (*)    Calcium 8.3 (*)    AST 10 (*)    All other components within normal limits  CBC WITH DIFFERENTIAL/PLATELET - Abnormal; Notable for the following components:   Hemoglobin 11.6 (*)    MCH 25.7 (*)    All other components within normal limits  URINALYSIS, ROUTINE W REFLEX MICROSCOPIC - Abnormal; Notable for the following components:   Specific Gravity, Urine 1.038 (*)    Glucose, UA >=500 (*)    Ketones, ur 5 (*)    Nitrite POSITIVE (*)    Leukocytes,Ua TRACE (*)    Bacteria, UA RARE (*)    All other components within normal limits  CBG MONITORING, ED - Abnormal; Notable for the following components:   Glucose-Capillary 437 (*)    All other components within normal limits  CBG MONITORING, ED - Abnormal; Notable for the following  components:   Glucose-Capillary 294 (*)    All other components within normal limits  URINE CULTURE  LIPASE, BLOOD  MAGNESIUM  I-STAT BETA HCG BLOOD, ED ([facility], [facility], [facility] ONLY)  I-STAT VENOUS BLOOD GAS, ED    EKG EKG Interpretation  Date/Time:  Wednesday October 19 2019 08:56:24 EDT Ventricular Rate:  100 PR Interval:    QRS Duration: 86 QT Interval:  356 QTC Calculation: 460 R Axis:   49 Text Interpretation: Sinus tachycardia Baseline wander in lead(s) V3 Confirmed by Tilden Fossa [phone]) on 10/19/2019 9:01:04 AM   Radiology DG Mandible 4 Views  Result Date: 10/19/2019 CLINICAL DATA:  Seizures, fall. EXAM: MANDIBLE - 4+ VIEW COMPARISON:  None. FINDINGS: There is no evidence of fracture or other focal bone lesions. IMPRESSION: Negative. Electronically Signed   By: Lupita Raider M.D.   On: 10/19/2019 10:30   DG Chest 2 View  Result Date: 10/19/2019 CLINICAL DATA:  Seizures. EXAM: CHEST - 2 VIEW COMPARISON:  August 23, 2019. FINDINGS: The heart size and mediastinal contours are within normal limits. Both lungs are clear. No pneumothorax or pleural effusion is noted. The visualized skeletal structures are unremarkable. IMPRESSION: No active cardiopulmonary disease. Electronically Signed   By: Lupita Raider M.D.   On: 10/19/2019 10:26   DG Hip Unilat W or Wo Pelvis 2-3 Views Left  Result Date: 10/19/2019 CLINICAL DATA:  Left hip pain after fall. EXAM: DG  HIP (WITH OR WITHOUT PELVIS) 2-3V LEFT COMPARISON:  None. FINDINGS: There is no evidence of hip fracture or dislocation. There is no evidence of arthropathy or other focal bone abnormality. IMPRESSION: Negative. Electronically Signed   By: Lupita Raider M.D.   On: 10/19/2019 10:27   DG Femur Min 2 Views Left  Result Date: 10/19/2019 CLINICAL DATA:  Seizures. EXAM: LEFT FEMUR 2 VIEWS COMPARISON:  None. FINDINGS: There is no evidence of fracture or other focal bone lesions. Soft tissues are unremarkable. IMPRESSION: Negative.  Electronically Signed   By: Lupita Raider M.D.   On: 10/19/2019 10:28    Procedures Procedures (including critical care time)  Medications Ordered in ED Medications  sodium chloride 0.9 % bolus 1,000 mL (0 mLs Intravenous Stopped 10/19/19 1330)  fentaNYL (SUBLIMAZE) injection 50 mcg (50 mcg Intravenous Given 10/19/19 0915)  sorbitol, milk of mag, mineral oil, glycerin (SMOG) enema (960 mLs Rectal Given 10/19/19 1313)  morphine 4 MG/ML injection 4 mg (4 mg Intravenous Given 10/19/19 1105)  cefTRIAXone (ROCEPHIN) 1 g in sodium chloride 0.9 % 100 mL IVPB (0 g Intravenous Stopped 10/19/19 1330)    ED Course  I have reviewed the triage vital signs and the nursing notes.  Pertinent labs & imaging results that were available during my care of the patient were reviewed by me and considered in my medical decision making (see chart for details).    MDM Rules/Calculators/A&P                         Patient with history of diabetes, chronic pain, pseudo-seizures here for evaluation following five seizure like episodes at home. She is hyperglycemic but non-toxic appearing on evaluation. She is neurologically intact. Labs with hyperglycemia without evidence of DKA. Labs with stable anemia. UA is consistent with UTI, will treat with antibiotics. Abdominal examination is benign and not consistent with acute abdomen. Doubt significant upper G.I. bleed based on patient's history of constipation, no active bleeding visualized in the emergency department and stable hemoglobin. She does have swelling to her left lower jaw, on record review it appears that this is been present prior. No evidence of acute fracture. Exam is not consistent with dental abscess. Discussed dentistry follow-up. Presentation is not consistent with PE. On repeat assessment after fluids and medications in the department she is feeling improved. Plan to discharge home with outpatient follow-up and return precautions.   Final Clinical  Impression(s) / ED Diagnoses Final diagnoses:  Hyperglycemia  Generalized abdominal pain  Acute UTI    Rx / DC Orders ED Discharge Orders    None       Tilden Fossa, MD 10/19/19 1547

## 2019-10-21 LAB — URINE CULTURE: Culture: >=100000 — AB

## 2019-10-22 ENCOUNTER — Telehealth: Payer: Self-pay | Admitting: Emergency Medicine

## 2019-10-22 NOTE — Progress Notes (Signed)
ED Antimicrobial Stewardship Positive Culture Follow Up   Nancy [name] is an 19 y.o. female who presented to [facility] on 10/19/2019 with a chief complaint of: Chief Complaint  Patient presents with  . Abdominal Pain  . Seizures  . Hyperglycemia    Recent Results (from the past 720 hour(s))  Respiratory Panel by RT PCR (Flu A&B, Covid) - Nasopharyngeal Swab     Status: None   Collection Time: 09/30/19  8:34 PM   Specimen: Nasopharyngeal Swab  Result Value Ref Range Status   SARS Coronavirus 2 by RT PCR NEGATIVE NEGATIVE Final    Comment: (NOTE) SARS-CoV-2 target nucleic acids are NOT DETECTED.  The SARS-CoV-2 RNA is generally detectable in upper respiratoy specimens during the acute phase of infection. The lowest concentration of SARS-CoV-2 viral copies this assay can detect is 131 copies/mL. A negative result does not preclude SARS-Cov-2 infection and should not be used as the sole basis for treatment or other patient management decisions. A negative result may occur with  improper specimen collection/handling, submission of specimen other than nasopharyngeal swab, presence of viral mutation(s) within the areas targeted by this assay, and inadequate number of viral copies (<131 copies/mL). A negative result must be combined with clinical observations, patient history, and epidemiological information. The expected result is Negative.  Fact Sheet for Patients:  https://www.moore.com/  Fact Sheet for Healthcare Providers:  https://www.young.biz/  This test is no t yet approved or cleared by the [place] FDA and  has been authorized for detection and/or diagnosis of SARS-CoV-2 by FDA under an Emergency Use Authorization (EUA). This EUA will remain  in effect (meaning this test can be used) for the duration of the COVID-19 declaration under Section 564(b)(1) of the Act, 21 U.S.C. section 360bbb-3(b)(1), unless the authorization  is terminated or revoked sooner.     Influenza A by PCR NEGATIVE NEGATIVE Final   Influenza B by PCR NEGATIVE NEGATIVE Final    Comment: (NOTE) The Xpert Xpress SARS-CoV-2/FLU/RSV assay is intended as an aid in  the diagnosis of influenza from Nasopharyngeal swab specimens and  should not be used as a sole basis for treatment. Nasal washings and  aspirates are unacceptable for Xpert Xpress SARS-CoV-2/FLU/RSV  testing.  Fact Sheet for Patients: https://www.moore.com/  Fact Sheet for Healthcare Providers: https://www.young.biz/  This test is not yet approved or cleared by the [place] FDA and  has been authorized for detection and/or diagnosis of SARS-CoV-2 by  FDA under an Emergency Use Authorization (EUA). This EUA will remain  in effect (meaning this test can be used) for the duration of the  Covid-19 declaration under Section 564(b)(1) of the Act, 21  U.S.C. section 360bbb-3(b)(1), unless the authorization is  terminated or revoked. Performed at [facility], [address], [place], [place] [address]   Urine culture     Status: Abnormal   Collection Time: 10/19/19 12:58 PM   Specimen: Urine  Result Value Ref Range Status   Specimen Description   Final    Urine Performed at [facility], [address] [place], [place] [address]    Special Requests   Final    NONE Performed at [facility], [address] [place], [place] [address]    Culture >=100,000 COLONIES/mL ESCHERICHIA COLI (A)  Final   Report Status 10/21/2019 FINAL  Final   Organism ID, Bacteria ESCHERICHIA COLI (A)  Final      Susceptibility   Escherichia coli - MIC*  AMPICILLIN >=32 RESISTANT Resistant     CEFAZOLIN <=4 SENSITIVE Sensitive     CEFEPIME <=0.12 SENSITIVE Sensitive     CEFTAZIDIME <=1 SENSITIVE Sensitive     CEFTRIAXONE <=0.25 SENSITIVE Sensitive     CIPROFLOXACIN <=0.25 SENSITIVE Sensitive      GENTAMICIN <=1 SENSITIVE Sensitive     IMIPENEM <=0.25 SENSITIVE Sensitive     TRIMETH/SULFA <=20 SENSITIVE Sensitive     AMPICILLIN/SULBACTAM 16 INTERMEDIATE Intermediate     PIP/TAZO <=4 SENSITIVE Sensitive     * >=100,000 COLONIES/mL ESCHERICHIA COLI    [x]  Patient discharged originally without antimicrobial agent and treatment is now indicated  New antibiotic prescription: cephalexin 500mg  PO bid x 5 days  ED Provider: PA-C   10/22/2019, 9:38 AM Clinical Pharmacist [phone]

## 2019-10-22 NOTE — Telephone Encounter (Signed)
Post ED Visit - Positive Culture Follow-up: Unsuccessful Patient Follow-up  Culture assessed and recommendations reviewed by:  []  , Pharm.D. []  Enzo Bi, Pharm.D., BCPS AQ-ID []  , Pharm.D., BCPS []  Celedonio Miyamoto, Pharm.D., BCPS []  [place], Garvin Fila.D., BCPS, AAHIVP []  , Pharm.D., BCPS, AAHIVP [x]  Georgina Pillion, PharmD []  , PharmD, BCPS  Positive urine culture  [x]  Patient discharged without antimicrobial prescription and treatment is now indicated []  Organism is resistant to prescribed ED discharge antimicrobial []  Patient with positive blood cultures   Unable to contact patient after 3 attempts, letter will be sent to address on file  Plan: Keflex 500 mg PO BID x five days [place], [address]  C Madalin Hughart 10/22/2019, 2:27 PM

## 2019-10-30 IMAGING — CT CT CERVICAL SPINE W/O CM
3 of 4 series · 13 of 33 positions shown, 16 images · non-contrast
Comparison: 08/06/2018

CLINICAL DATA: Trauma

EXAM:
CT HEAD WITHOUT CONTRAST
CT CERVICAL SPINE WITHOUT CONTRAST
TECHNIQUE: Multidetector CT imaging of the head and cervical spine was
performed following the standard protocol without intravenous
contrast. Multiplanar CT image reconstructions of the cervical spine
were also generated.

[Series 10: c_spine 2.0 sag bone · sagittal · 0.26mm/px · 5 of 61 slices shown, 6 images]
[im 21/61  bone]
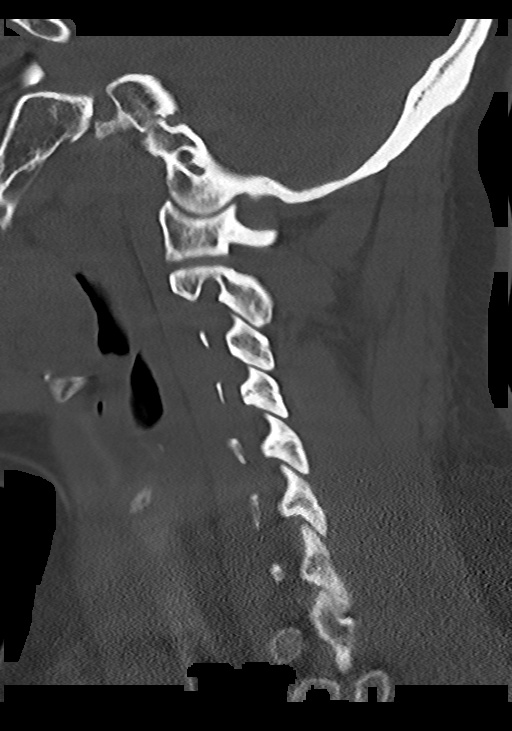
[im 26/61  bone]
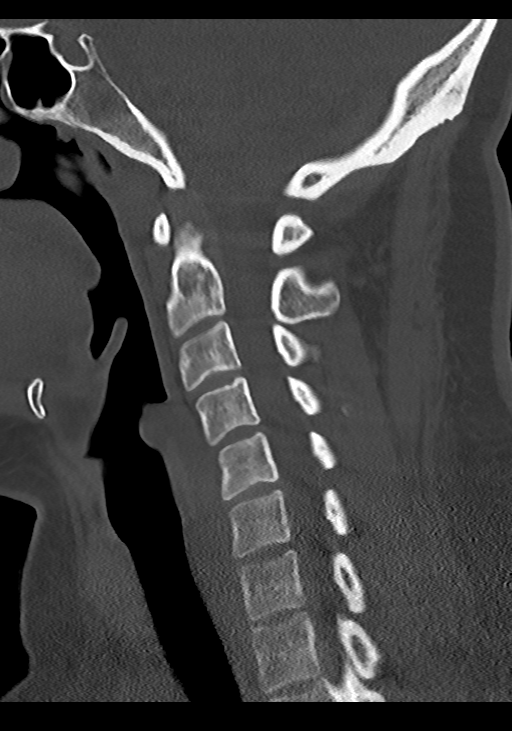
[im 31/61  soft-tissue]
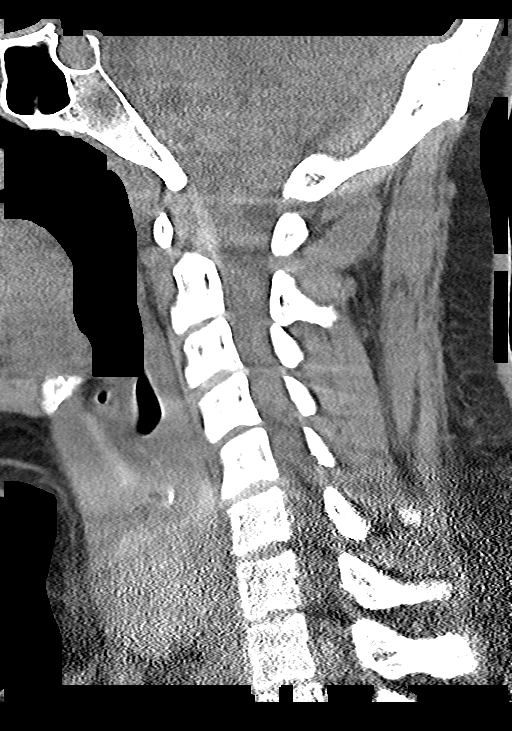
[im 31/61  bone]
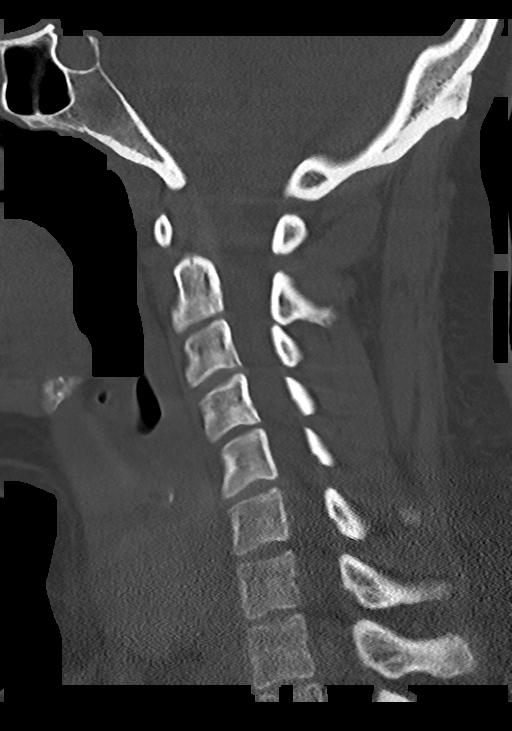
[im 36/61  bone]
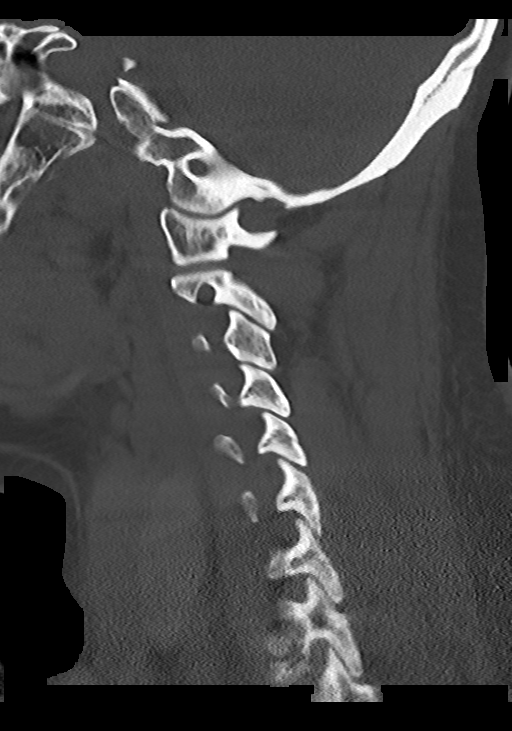
[im 41/61  bone]
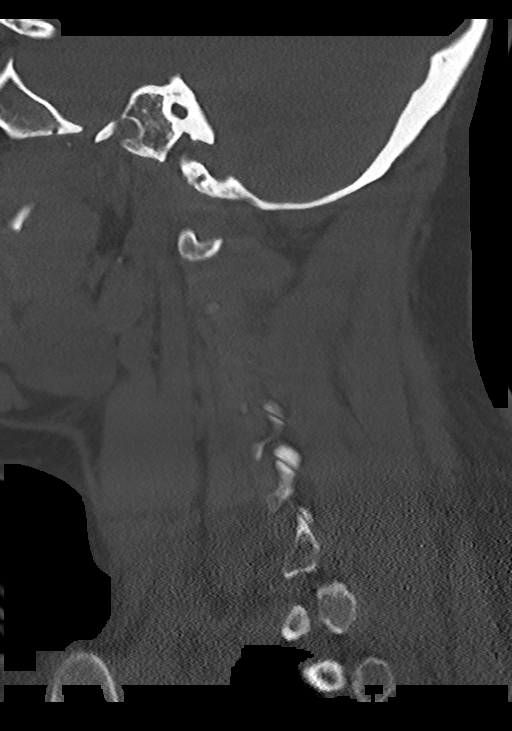

[Series 11: c_spine 2.0 cor bone · coronal · 0.26mm/px · 3 of 61 slices shown]
[im 13/61  bone]
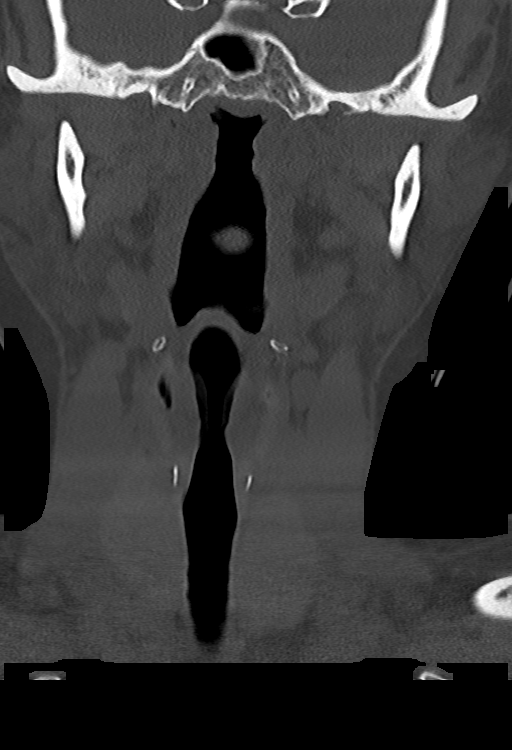
[im 25/61  bone]
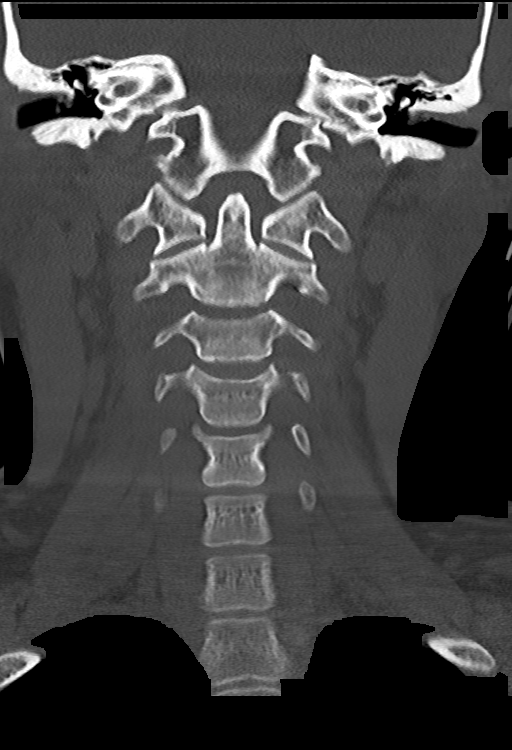
[im 37/61  bone]
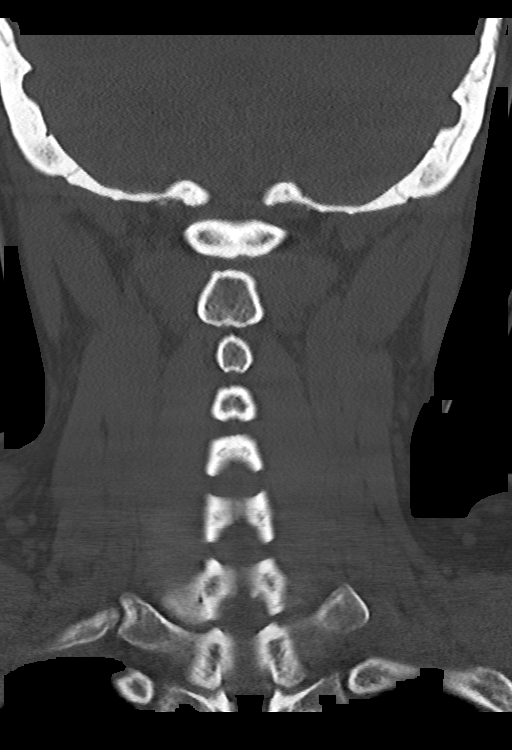

[Series 13: c_spine 1.0 st thins · axial · 0.32mm/px · z∈[-218,-78]mm · 5 of 250 slices shown, 7 images]
[im 25/250  soft-tissue]
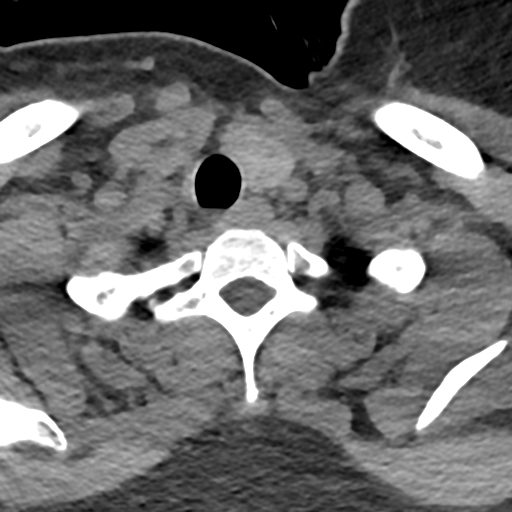
[im 25/250  bone]
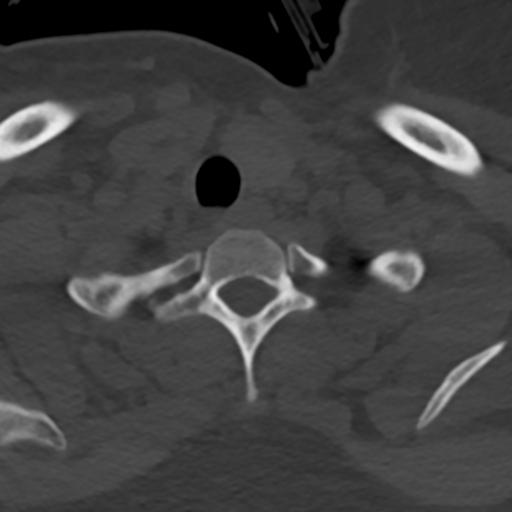
[im 75/250  bone]
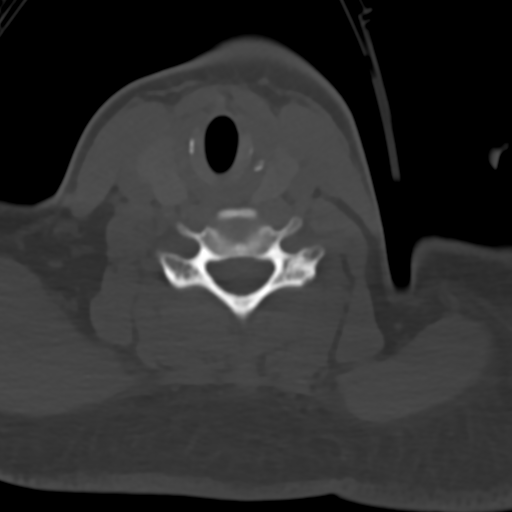
[im 125/250  bone]
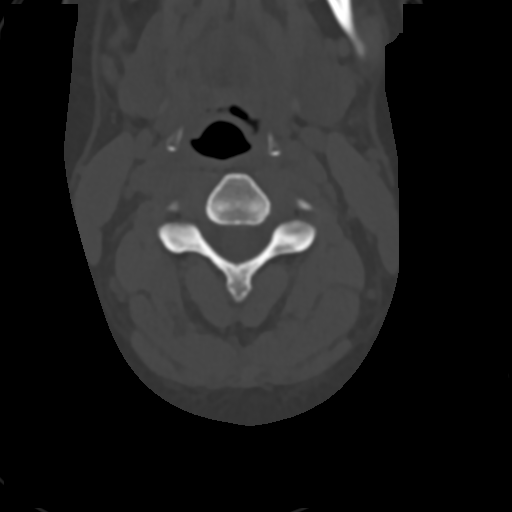
[im 175/250  bone]
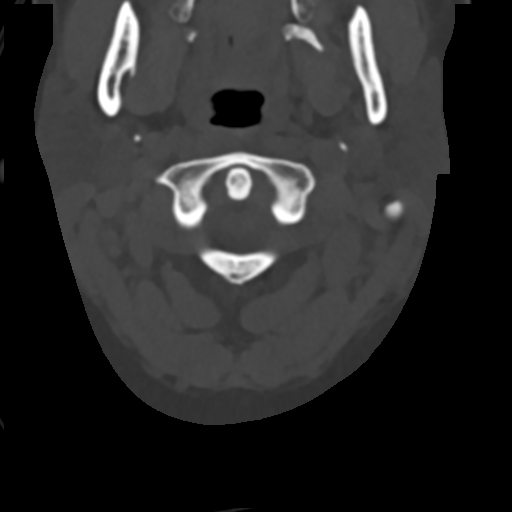
[im 225/250  soft-tissue]
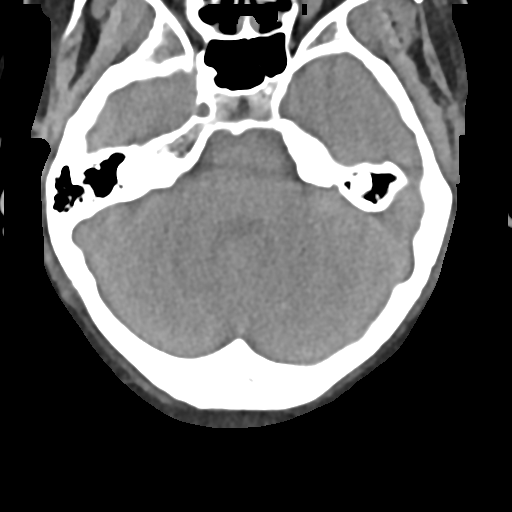
[im 225/250  bone]
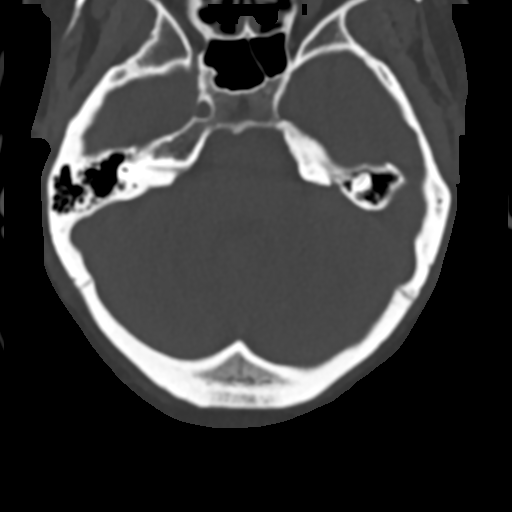

[13 of 33 positions shown; findings below may reference images not displayed]

FINDINGS: CT HEAD FINDINGS

Brain: No evidence of acute infarction, hemorrhage, hydrocephalus,
extra-axial collection or mass lesion/mass effect.

Vascular: No hyperdense vessel or unexpected calcification.

Skull: Normal. Negative for fracture or focal lesion.

Sinuses/Orbits: No acute finding.

Other: None.

CT CERVICAL SPINE FINDINGS

Alignment: Positional straightening of the normal cervical lordosis.

Skull base and vertebrae: No acute fracture. No primary bone lesion
or focal pathologic process.

Soft tissues and spinal canal: No prevertebral fluid or swelling. No
visible canal hematoma.

Disc levels:  Intact.

Upper chest: Negative.

Other: None.
IMPRESSION: 1.  No acute intracranial pathology.

2.  No fracture or static subluxation of the cervical spine.

## 2019-10-30 IMAGING — CT CT HEAD W/O CM
4 of 8 series · 16 of 47 positions shown, 17 images · non-contrast
Comparison: 08/06/2018

CLINICAL DATA: Trauma

EXAM:
CT HEAD WITHOUT CONTRAST
CT CERVICAL SPINE WITHOUT CONTRAST
TECHNIQUE: Multidetector CT imaging of the head and cervical spine was
performed following the standard protocol without intravenous
contrast. Multiplanar CT image reconstructions of the cervical spine
were also generated.

[Series 4: head bone · axial · 0.46mm/px · z∈[-58,+40]mm · 4 of 83 slices shown, 5 images]
[im 17/83  brain]
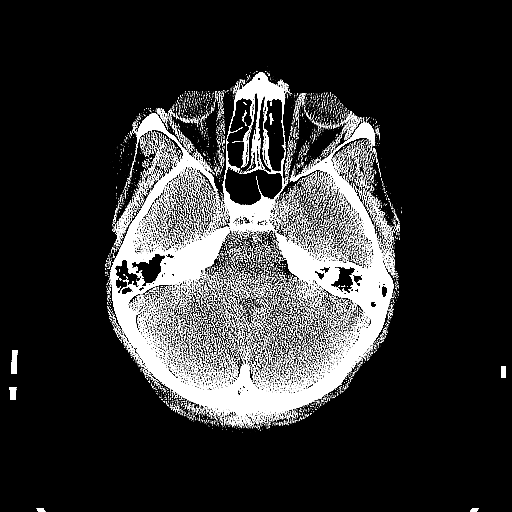
[im 17/83  bone]
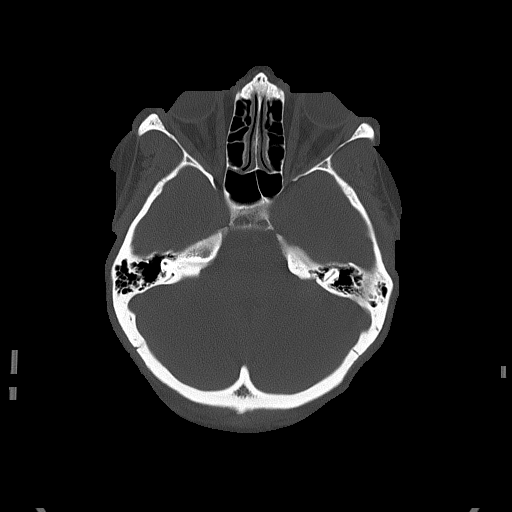
[im 33/83  brain]
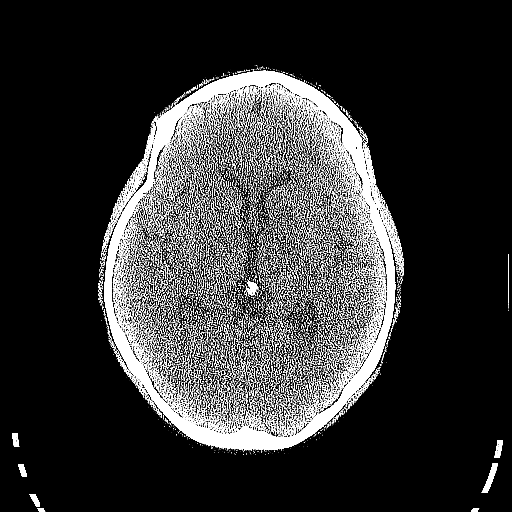
[im 50/83  brain]
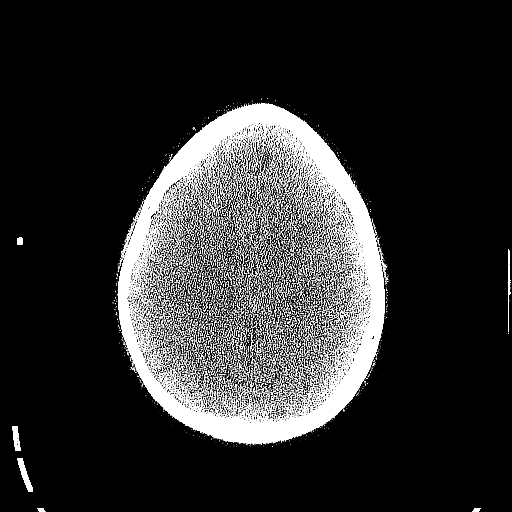
[im 66/83  brain]
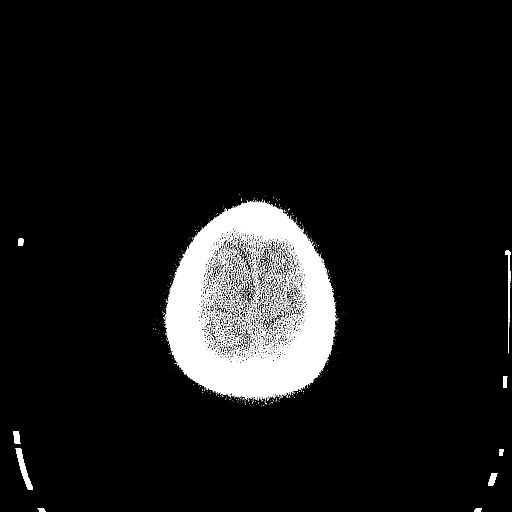

[Series 6: head without sag · sagittal · non-contrast · 0.32mm/px · 2 of 67 slices shown]
[im 23/67  brain]
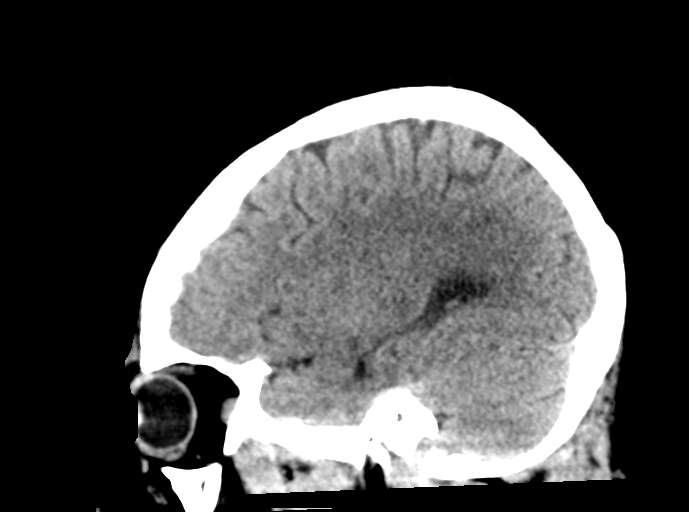
[im 45/67  brain]
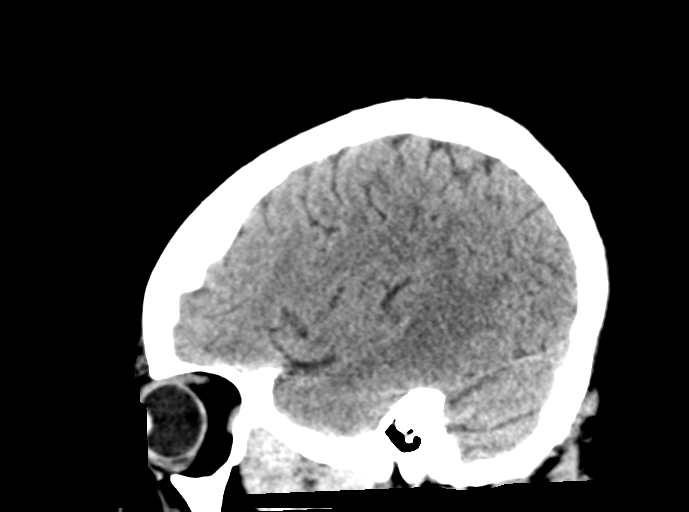

[Series 11: c_spine 2.0 cor bone · coronal · 0.26mm/px · 3 of 61 slices shown]
[im 23/61  brain]
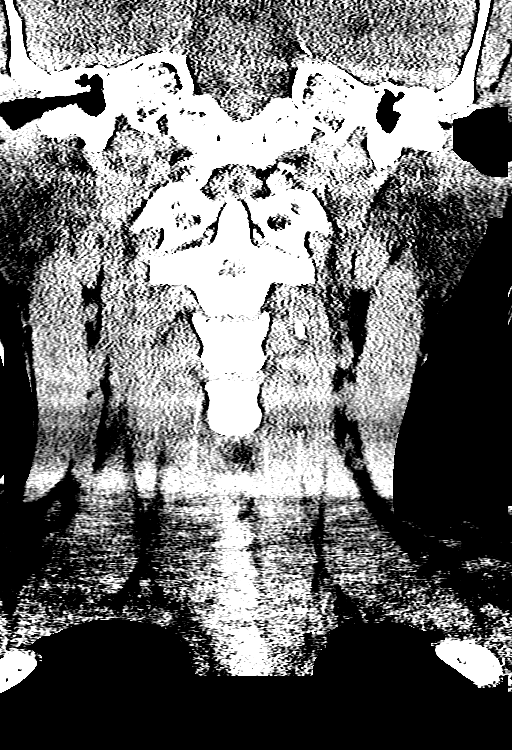
[im 31/61  brain]
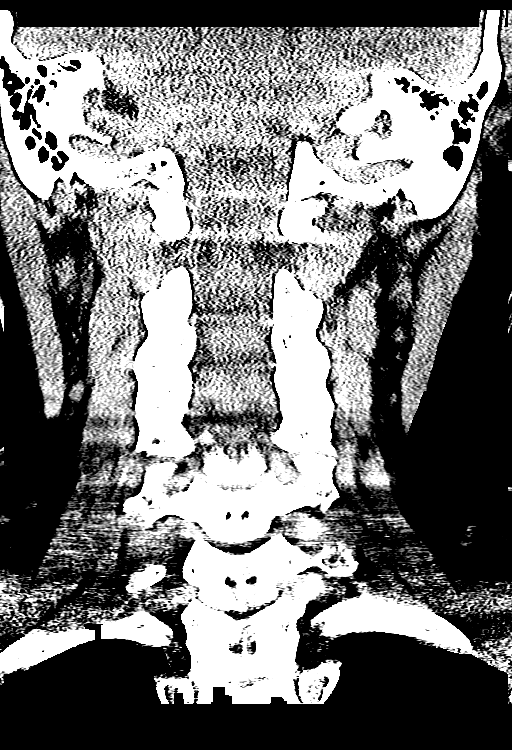
[im 38/61  brain]
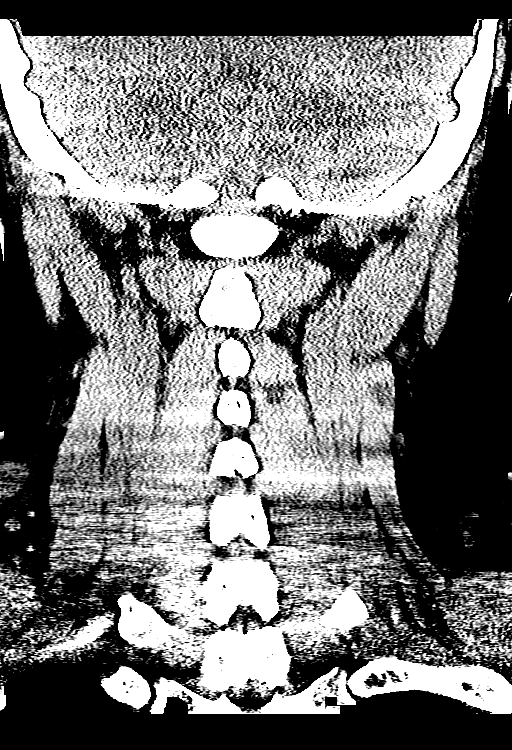

[Series 13: c_spine 1.0 st thins · axial · 0.32mm/px · z∈[-224,-93]mm · 7 of 250 slices shown]
[im 16/250  brain]
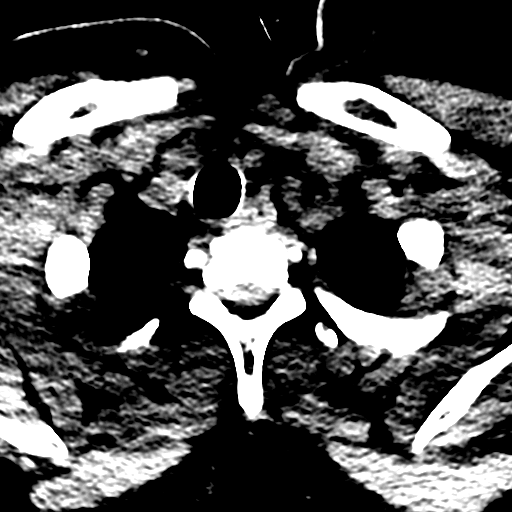
[im 47/250  brain]
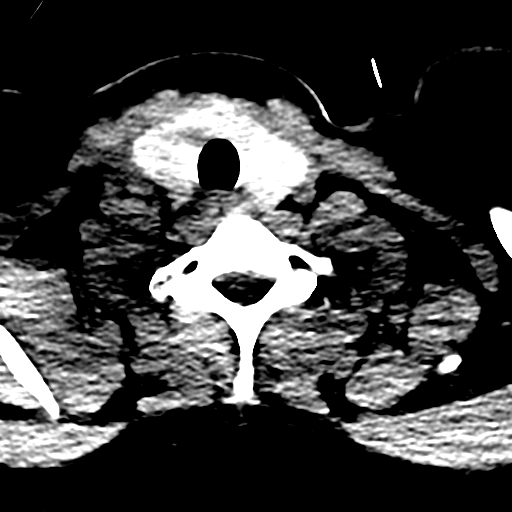
[im 78/250  brain]
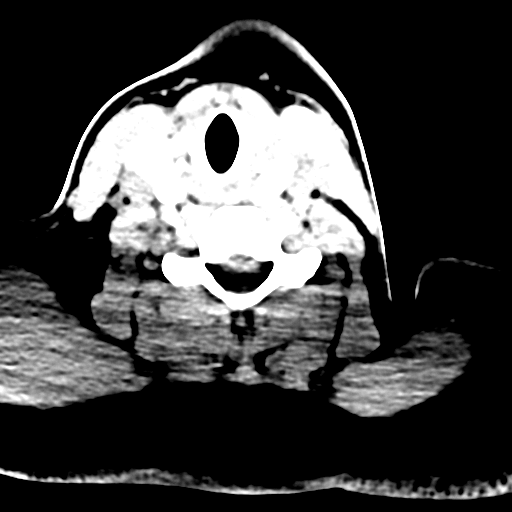
[im 109/250  brain]
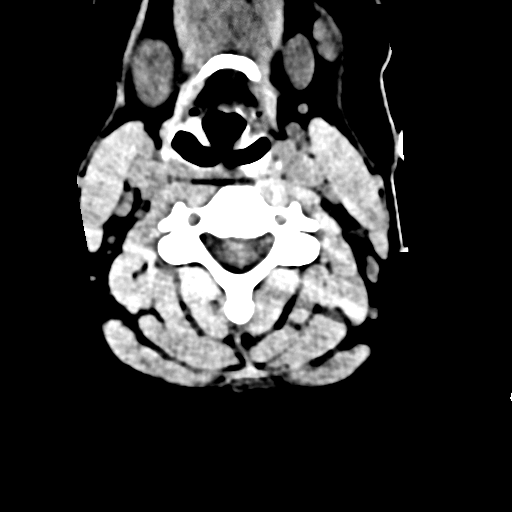
[im 141/250  brain]
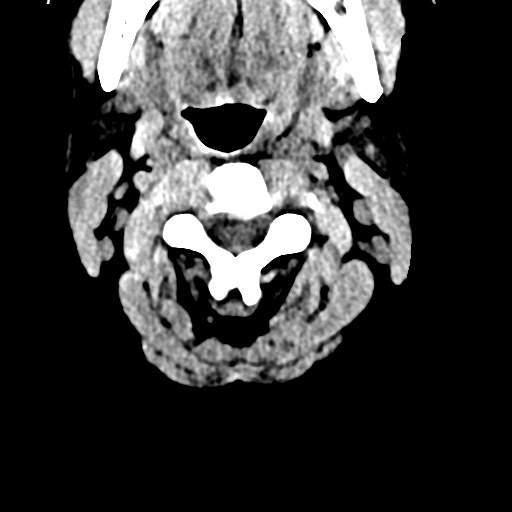
[im 172/250  brain]
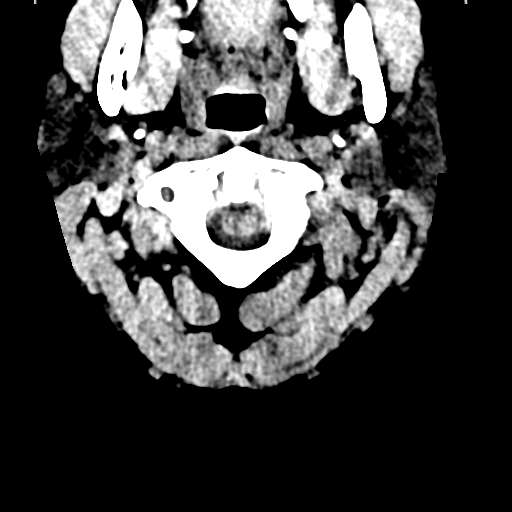
[im 203/250  brain]
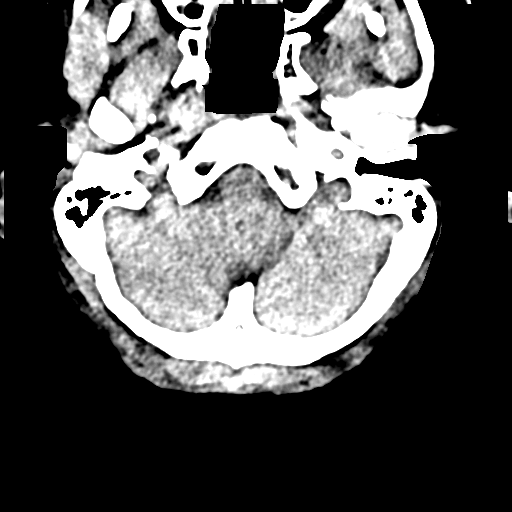

[16 of 47 positions shown; findings below may reference images not displayed]

FINDINGS: CT HEAD FINDINGS

Brain: No evidence of acute infarction, hemorrhage, hydrocephalus,
extra-axial collection or mass lesion/mass effect.

Vascular: No hyperdense vessel or unexpected calcification.

Skull: Normal. Negative for fracture or focal lesion.

Sinuses/Orbits: No acute finding.

Other: None.

CT CERVICAL SPINE FINDINGS

Alignment: Positional straightening of the normal cervical lordosis.

Skull base and vertebrae: No acute fracture. No primary bone lesion
or focal pathologic process.

Soft tissues and spinal canal: No prevertebral fluid or swelling. No
visible canal hematoma.

Disc levels:  Intact.

Upper chest: Negative.

Other: None.
IMPRESSION: 1.  No acute intracranial pathology.

2.  No fracture or static subluxation of the cervical spine.

## 2019-10-30 IMAGING — DX DG THORACIC SPINE 2V
2 series · 2 of 2 positions shown · non-contrast
Comparison: Chest radiograph dated 10/30/2018 and thoracic spine
radiograph dated 08/06/2018

CLINICAL DATA: 18-year-old female with emesis.

EXAM:
THORACIC SPINE 2 VIEWS

[t-spine ap]
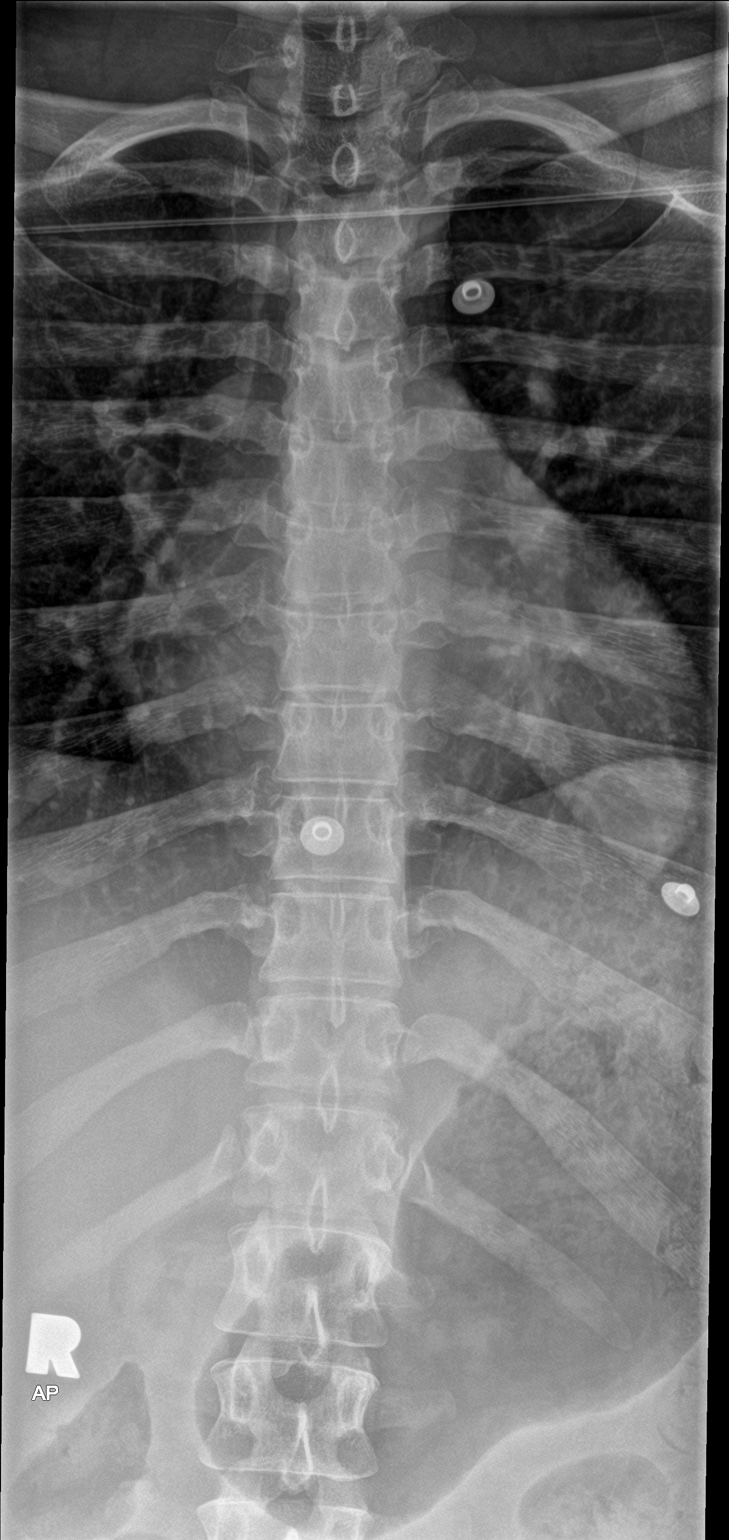

[t-spine lat]
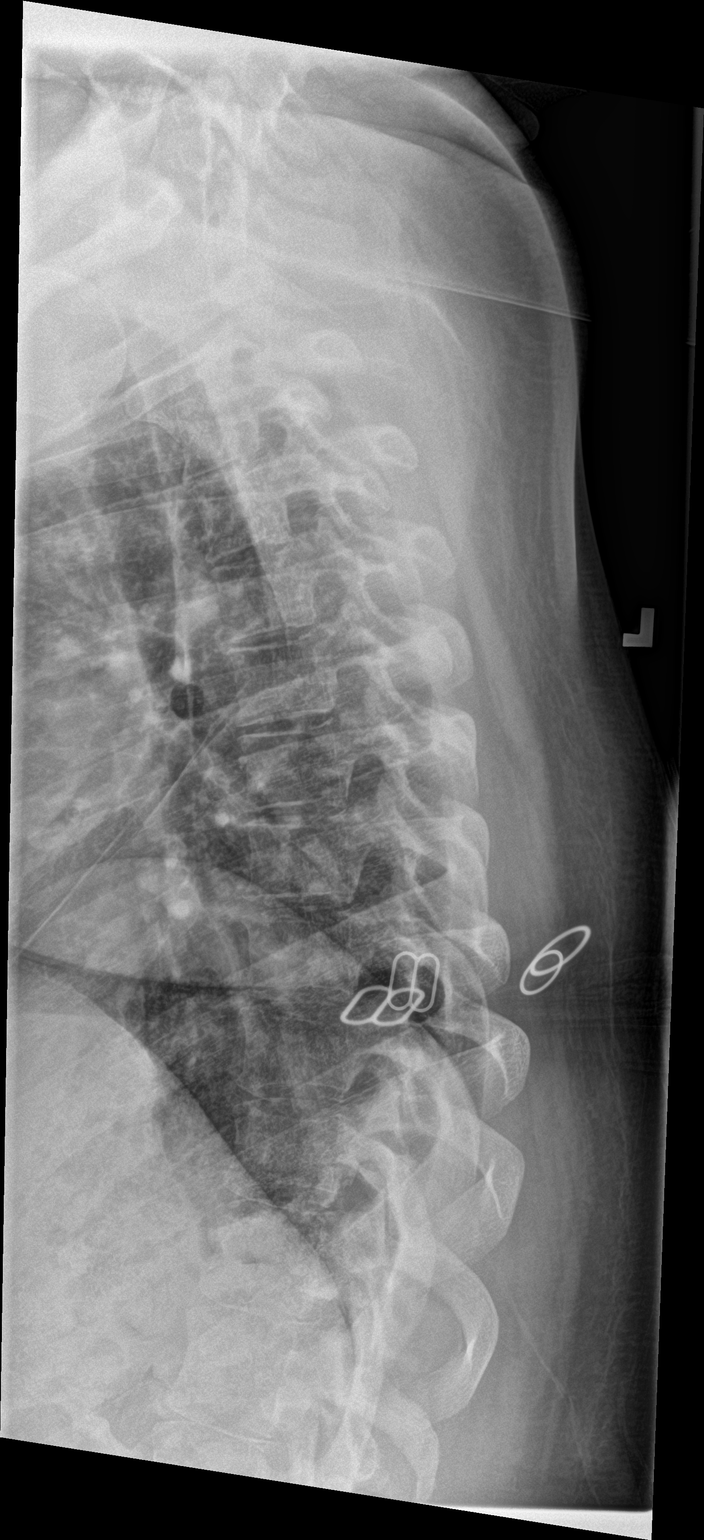

[2 of 2 positions shown; findings below may reference images not displayed]

FINDINGS: There is no acute fracture or subluxation of the thoracic spine. The
vertebral body heights and disc spaces are maintained. The
visualized posterior elements appear intact. The soft tissues are
grossly unremarkable.
IMPRESSION: No acute/traumatic thoracic spine pathology.

## 2019-10-30 IMAGING — DX DG CHEST 1V PORT
1 series · 1 of 1 positions shown · non-contrast
Comparison: 10/19/2018

CLINICAL DATA: Multiple seizures, pain

EXAM:
PORTABLE CHEST 1 VIEW

[chest ap]
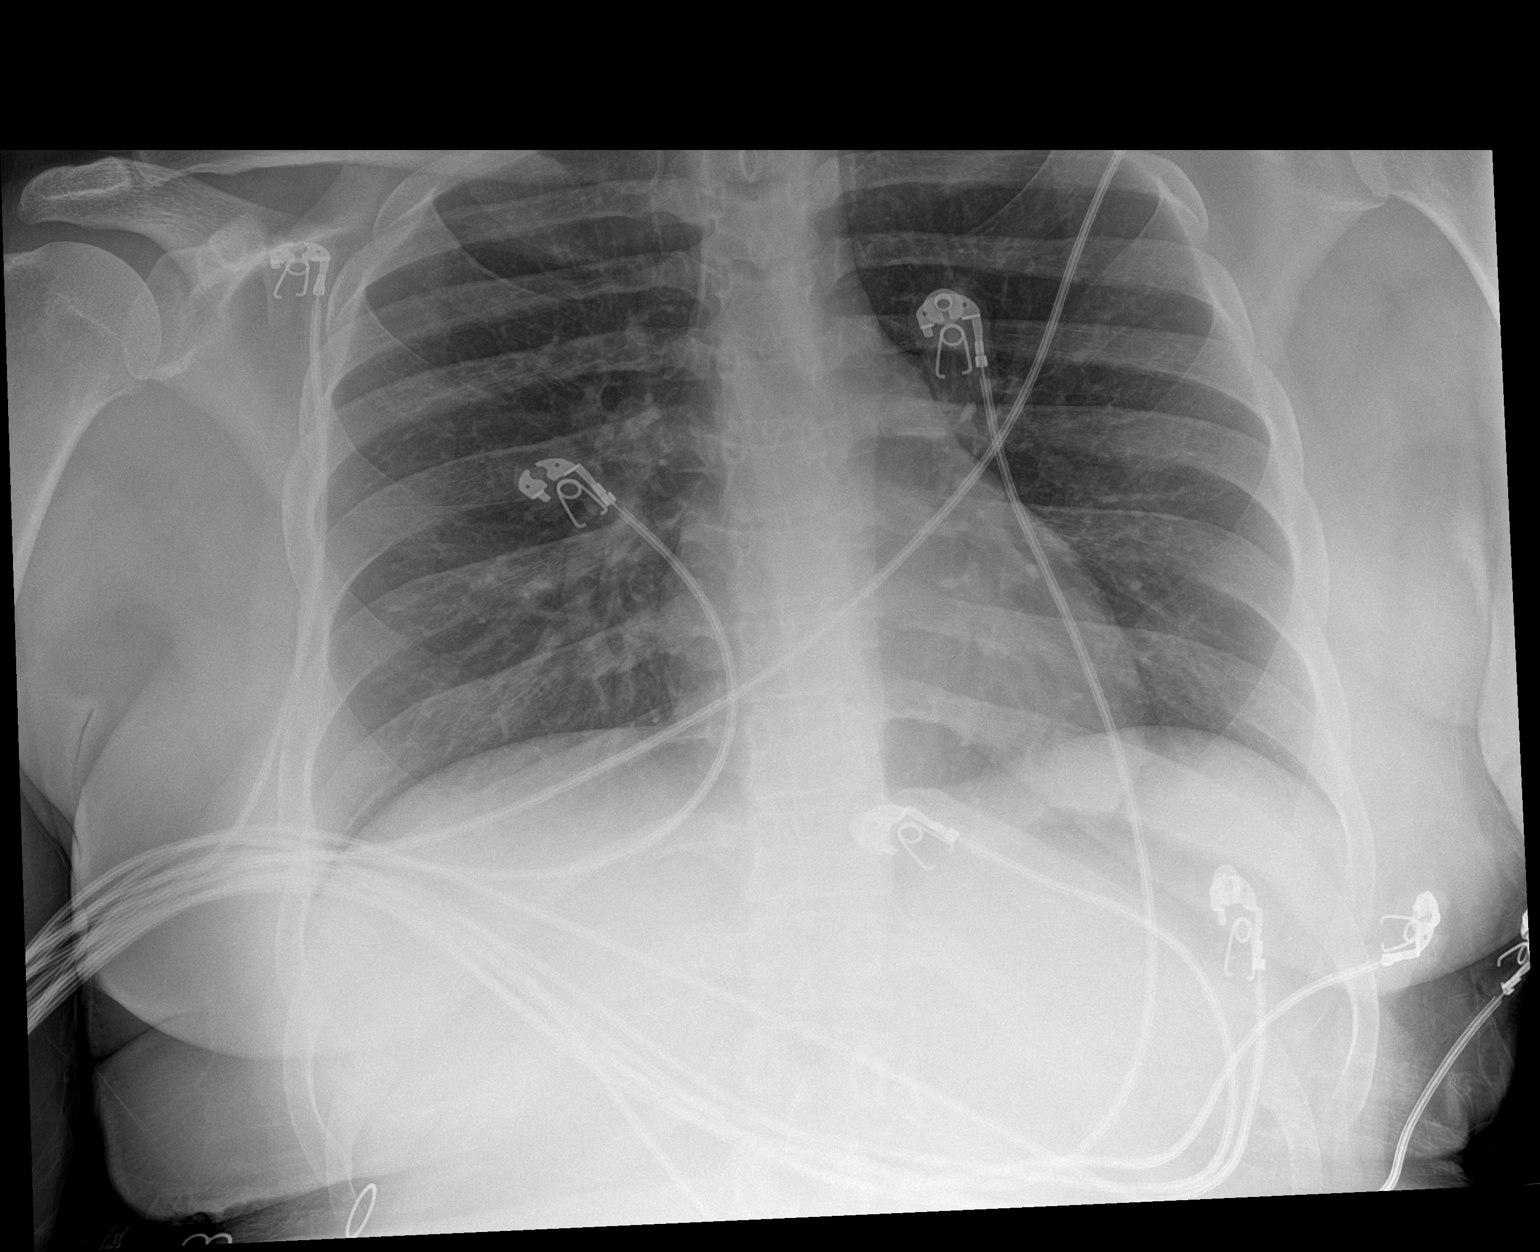

[1 of 1 positions shown; findings below may reference images not displayed]

FINDINGS: The heart size and mediastinal contours are within normal limits.
Both lungs are clear. The visualized skeletal structures are
unremarkable.
IMPRESSION: No active disease.

## 2019-11-01 ENCOUNTER — Other Ambulatory Visit: Payer: Self-pay

## 2019-11-01 ENCOUNTER — Emergency Department (HOSPITAL_COMMUNITY)
Admission: EM | Admit: 2019-11-01 | Discharge: 2019-11-01 | Disposition: A | Discharge status: Home / Self Care | Payer: Medicaid Other | Attending: Emergency Medicine | Admitting: Emergency Medicine

## 2019-11-01 DIAGNOSIS — I110 Hypertensive heart disease with heart failure: Secondary | ICD-10-CM | POA: Diagnosis not present

## 2019-11-01 DIAGNOSIS — I5032 Chronic diastolic (congestive) heart failure: Secondary | ICD-10-CM | POA: Insufficient documentation

## 2019-11-01 DIAGNOSIS — R739 Hyperglycemia, unspecified: Secondary | ICD-10-CM

## 2019-11-01 DIAGNOSIS — E1110 Type 2 diabetes mellitus with ketoacidosis without coma: Secondary | ICD-10-CM | POA: Insufficient documentation

## 2019-11-01 DIAGNOSIS — R569 Unspecified convulsions: Secondary | ICD-10-CM | POA: Diagnosis not present

## 2019-11-01 DIAGNOSIS — E1165 Type 2 diabetes mellitus with hyperglycemia: Secondary | ICD-10-CM | POA: Insufficient documentation

## 2019-11-01 LAB — CBC
HCT: 40.1 % (ref 36.0–46.0)
Hemoglobin: 12.3 g/dL (ref 12.0–15.0)
MCH: 24.6 pg — ABNORMAL LOW (ref 26.0–34.0)
MCHC: 30.7 g/dL (ref 30.0–36.0)
MCV: 80.4 fL (ref 80.0–100.0)
Platelets: 286 10*3/uL (ref 150–400)
RBC: 4.99 MIL/uL (ref 3.87–5.11)
RDW: 14.8 % (ref 11.5–15.5)
WBC: 8.8 10*3/uL (ref 4.0–10.5)
nRBC: 0 % (ref 0.0–0.2)

## 2019-11-01 LAB — BASIC METABOLIC PANEL
Anion gap: 13 (ref 5–15)
BUN: 10 mg/dL (ref 6–20)
CO2: 25 mmol/L (ref 22–32)
Calcium: 8.9 mg/dL (ref 8.9–10.3)
Chloride: 98 mmol/L (ref 98–111)
Creatinine, Ser: 0.73 mg/dL (ref 0.44–1.00)
GFR, Estimated: >60 mL/min (ref >60)
Glucose, Bld: 302 mg/dL — ABNORMAL HIGH (ref 70–99)
Potassium: 3.9 mmol/L (ref 3.5–5.1)
Sodium: 136 mmol/L (ref 135–145)

## 2019-11-01 LAB — CBG MONITORING, ED: Glucose-Capillary: 300 mg/dL — ABNORMAL HIGH (ref 70–99)

## 2019-11-01 IMAGING — CR DG ABDOMEN ACUTE W/ 1V CHEST
3 series · 3 of 3 positions shown · non-contrast
Comparison: 10/08/18

CLINICAL DATA: Chest pain and abdominal pain, worsening

EXAM:
DG ABDOMEN ACUTE W/ 1V CHEST

[w abdomen upright]
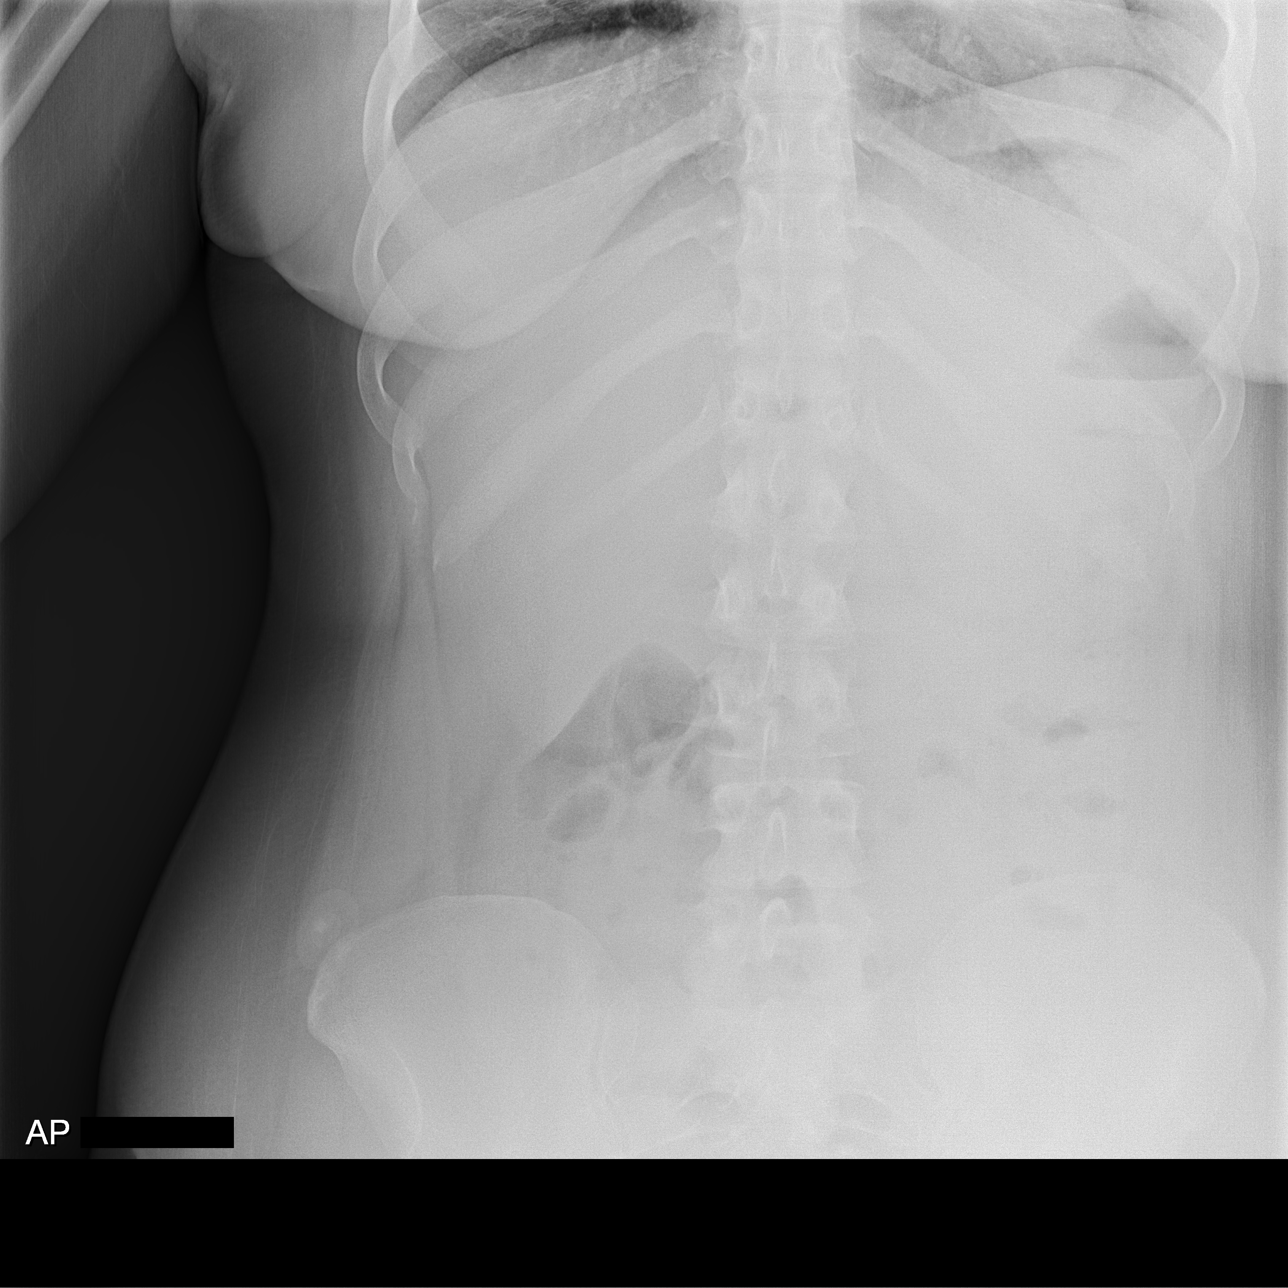

[w chest pa]
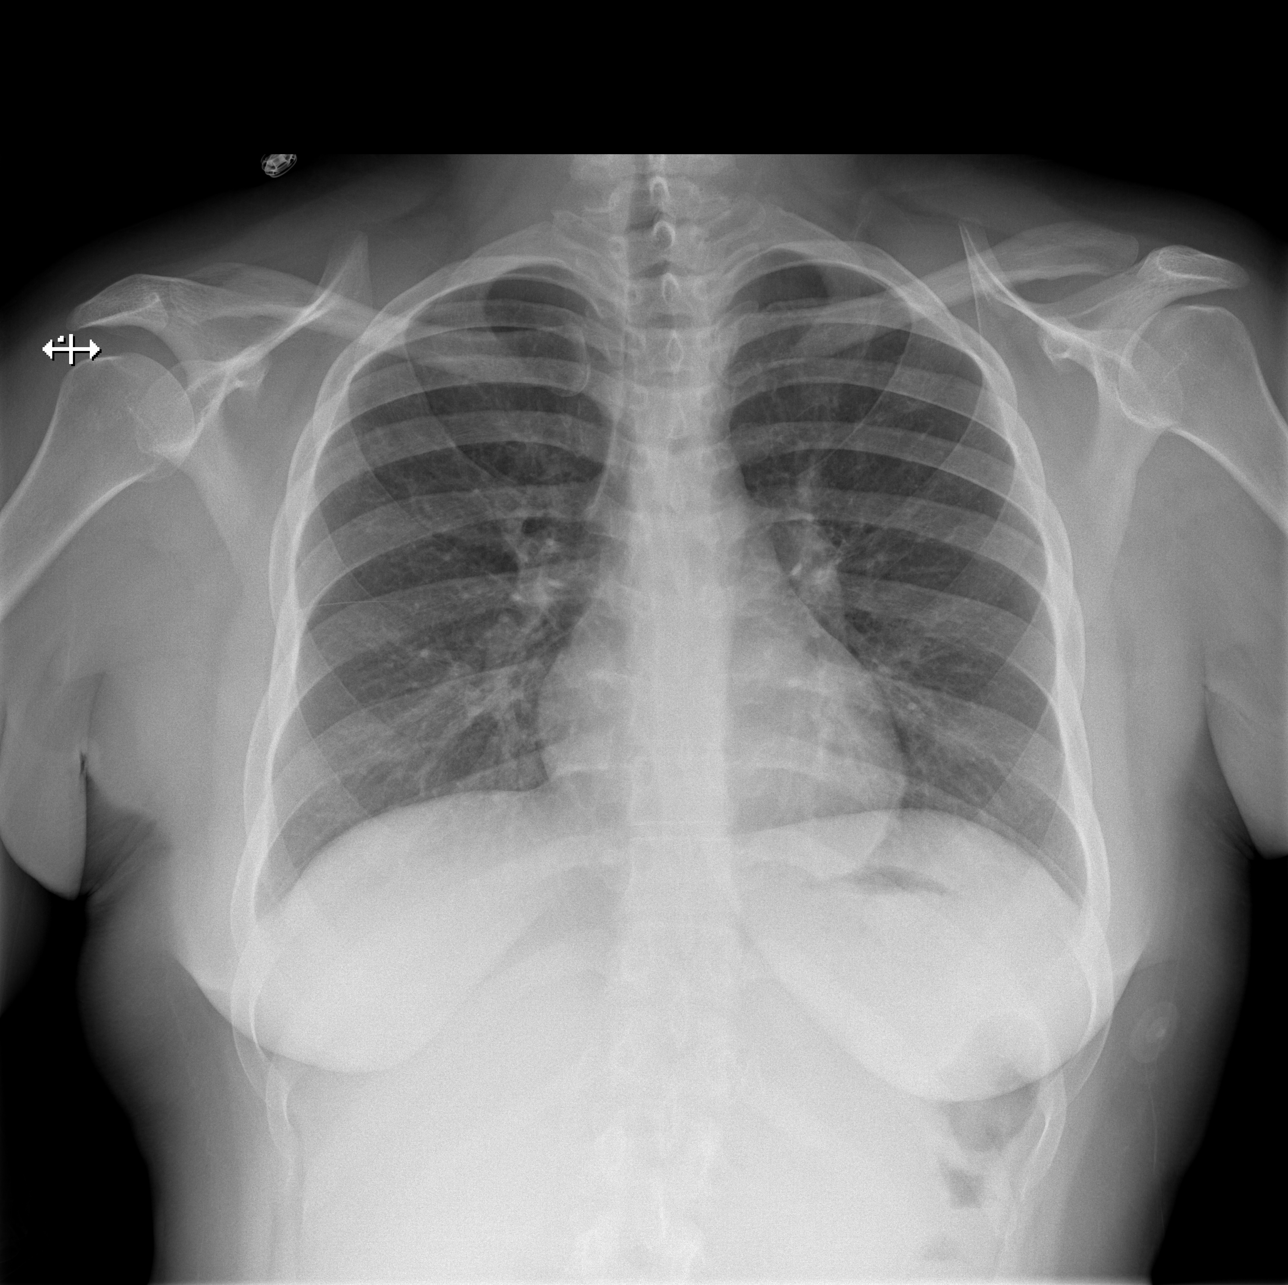

[t abdomen supine]
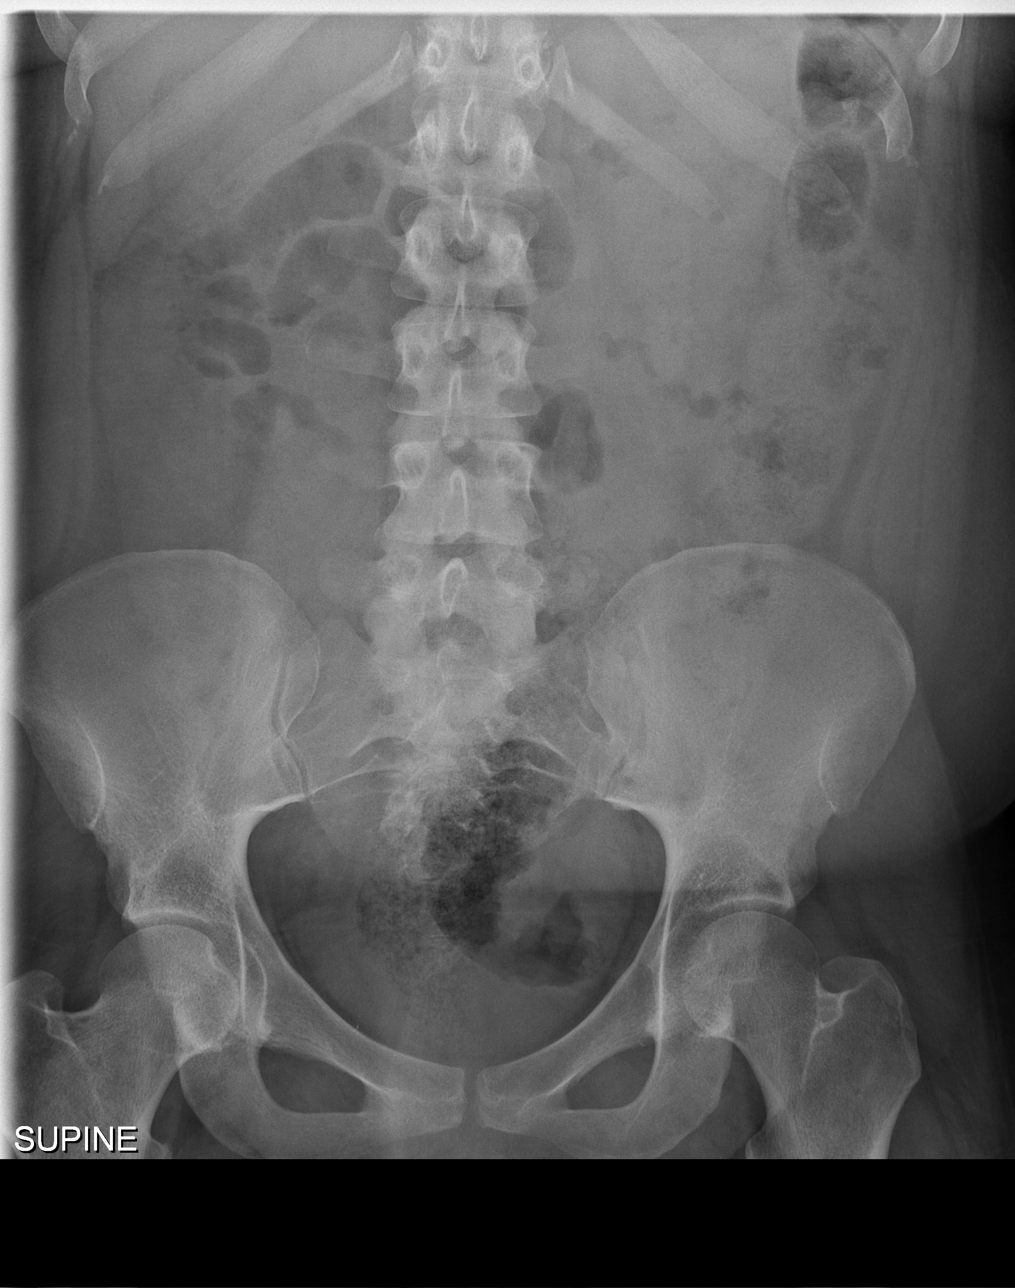

[3 of 3 positions shown; findings below may reference images not displayed]

FINDINGS: Normal heart size and mediastinal contours. No acute infiltrate or
edema. No effusion or pneumothorax. No acute osseous findings.

Normal bowel gas pattern. Mild to moderate stool volume in the left
colon without rectal distention. No concerning mass effect, gas
collection, or calcification.
IMPRESSION: Negative abdominal radiographs.  No acute cardiopulmonary disease.

## 2019-11-01 IMAGING — CT CT ANGIO CHEST-ABD-PELV FOR DISSECTION W/ AND WO/W CM
3 of 7 series · 9 of 46 positions shown, 15 images · IV contrast (omnipaque)
Comparison: October 08, 2018.

CLINICAL DATA: Acute chest and back pain.

EXAM:
CT ANGIOGRAPHY CHEST, ABDOMEN AND PELVIS
TECHNIQUE: Multidetector CT imaging through the chest, abdomen and pelvis was
performed using the standard protocol during bolus administration of
intravenous contrast. Multiplanar reconstructed images and MIPs were
obtained and reviewed to evaluate the vascular anatomy.
CONTRAST:  100mL OMNIPAQUE IOHEXOL 350 MG/ML SOLN

[Series 3: axial pre · axial · non-contrast · 0.71mm/px · z∈[-259,-124]mm · 4 of 47 slices shown, 9 images]
[im 10/47  soft-tissue]
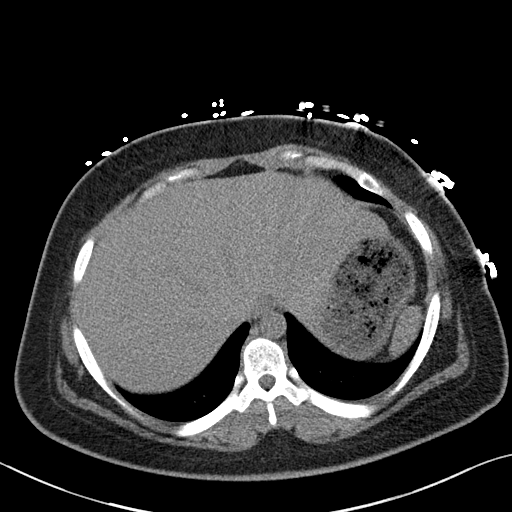
[im 10/47  lung]
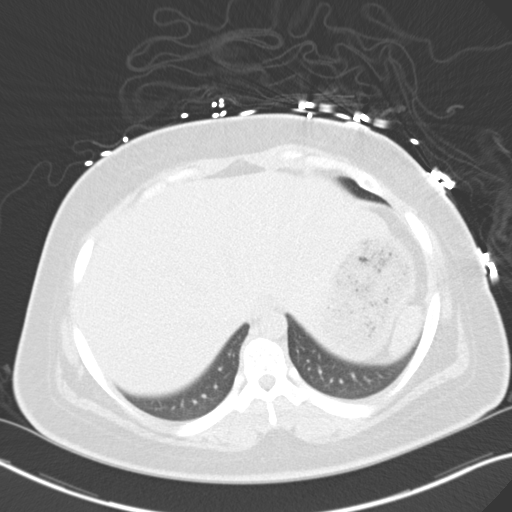
[im 10/47  bone]
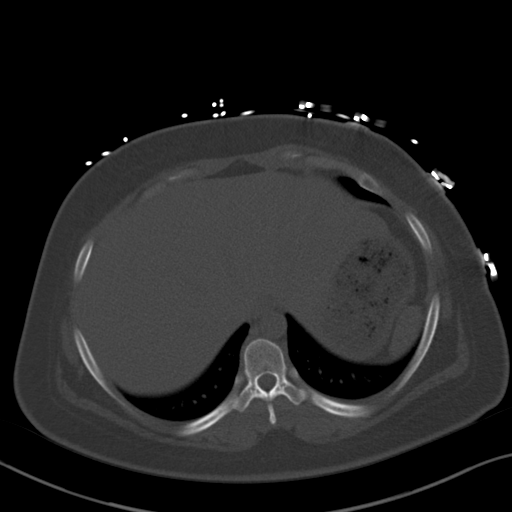
[im 19/47  soft-tissue]
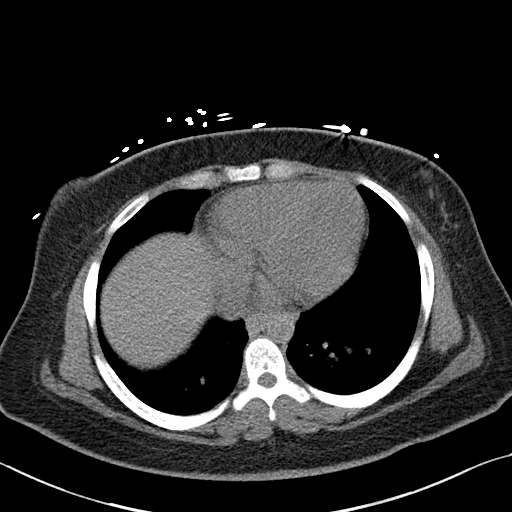
[im 19/47  lung]
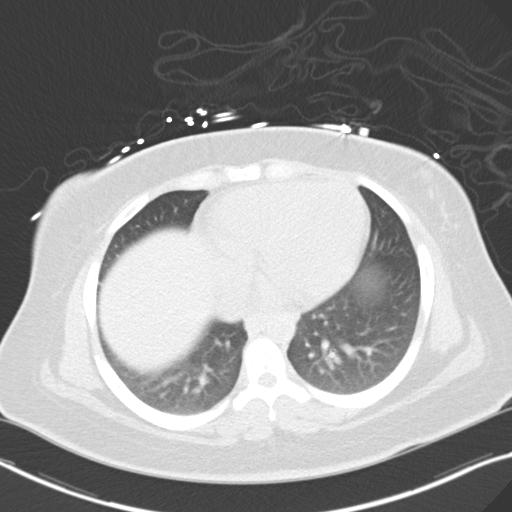
[im 28/47  soft-tissue]
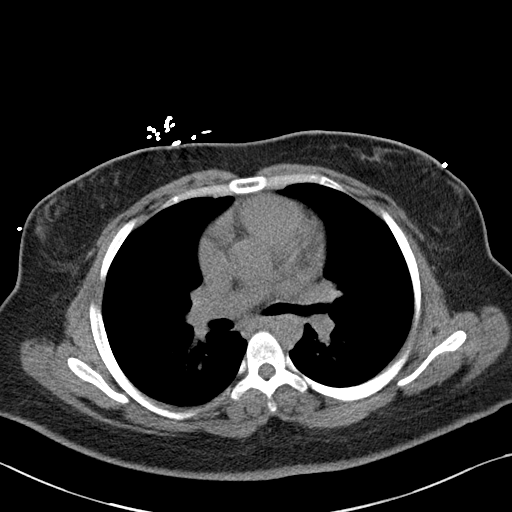
[im 28/47  lung]
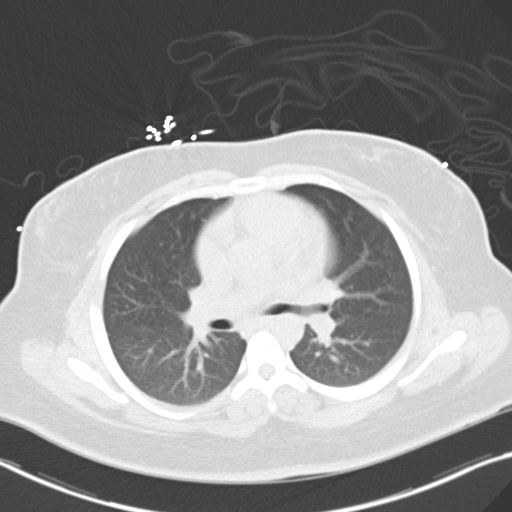
[im 37/47  soft-tissue]
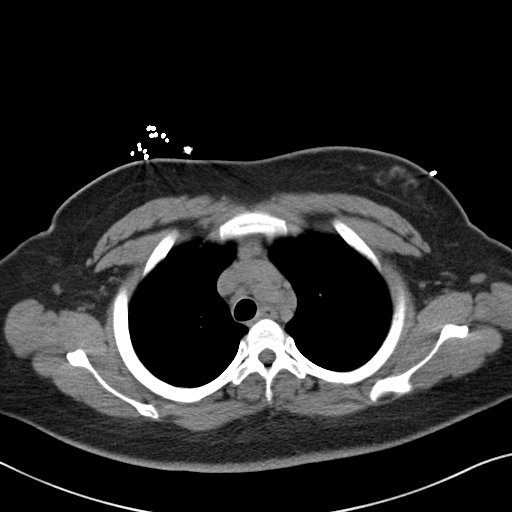
[im 37/47  lung]
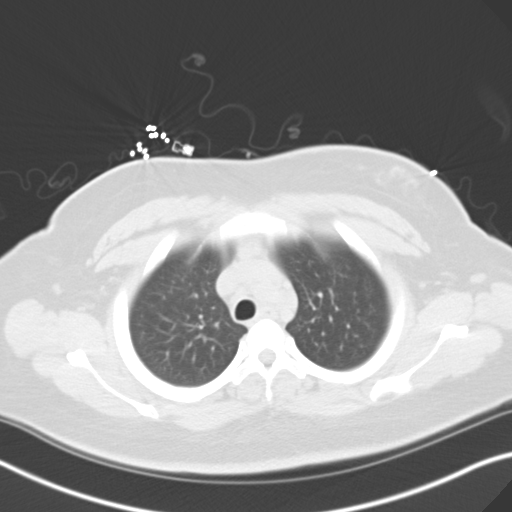

[Series 4: lung pre · axial · non-contrast · 0.71mm/px · z∈[-290,-250]mm · 2 of 118 slices shown]
[im 10/118  bone]
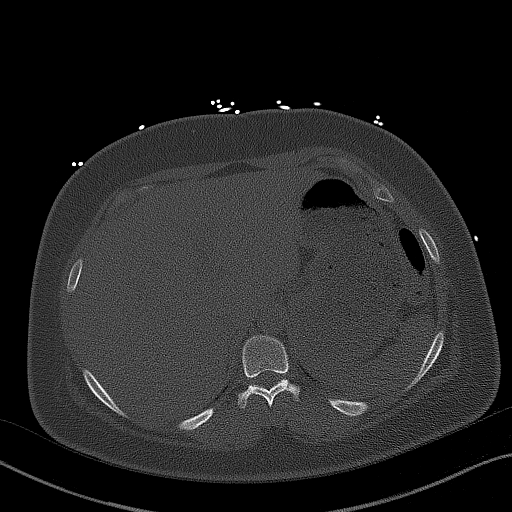
[im 30/118  bone]
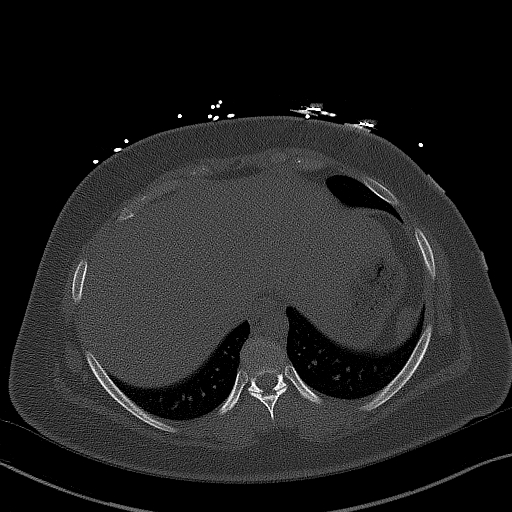

[Series 8: coronals · coronal · 0.68mm/px · 3 of 132 slices shown, 4 images]
[im 33/132  soft-tissue]
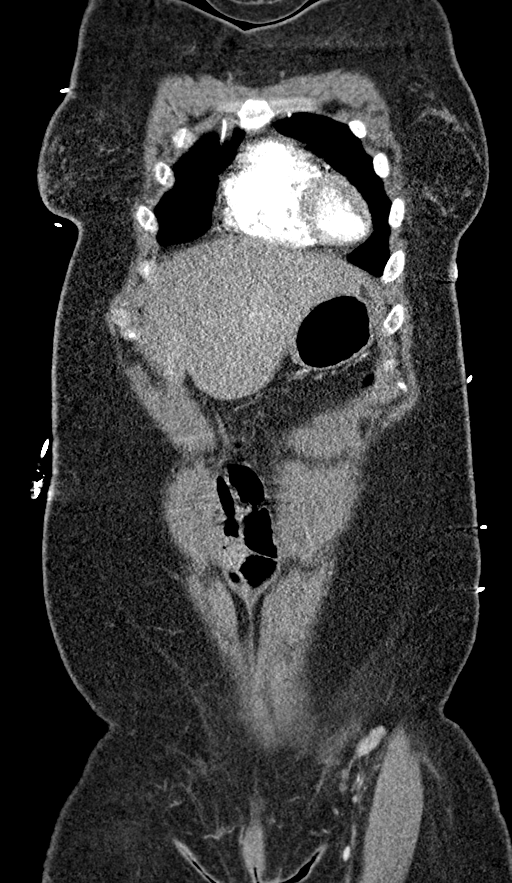
[im 66/132  soft-tissue]
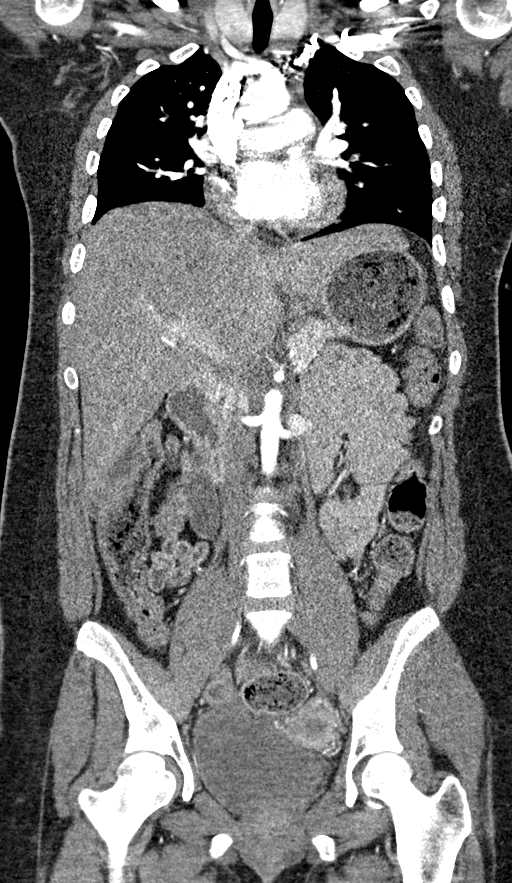
[im 66/132  bone]
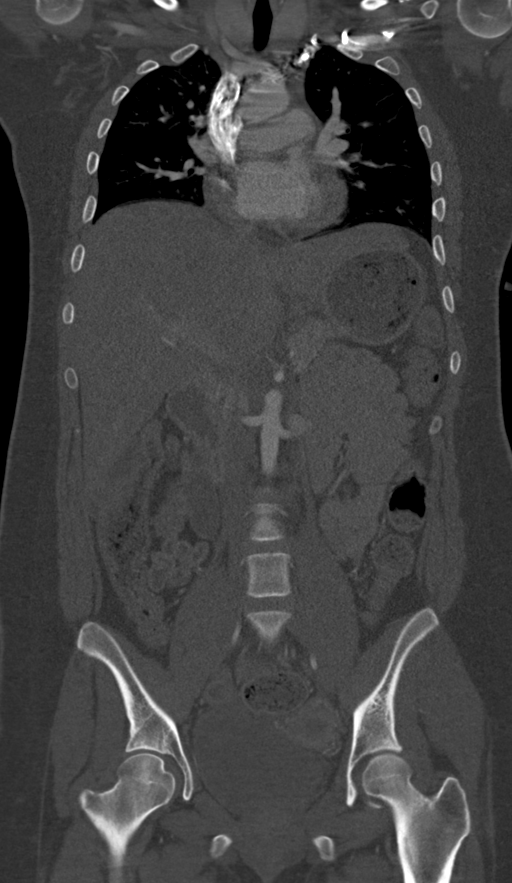
[im 99/132  soft-tissue]
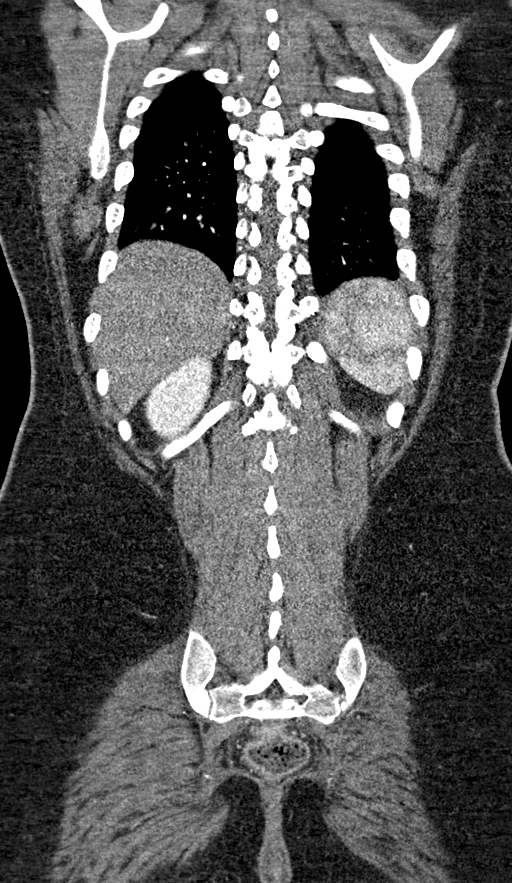

[9 of 46 positions shown; findings below may reference images not displayed]

FINDINGS: CTA CHEST FINDINGS

Cardiovascular: Preferential opacification of the thoracic aorta. No
evidence of thoracic aortic aneurysm or dissection. Normal heart
size. No pericardial effusion.

Mediastinum/Nodes: No enlarged mediastinal, hilar, or axillary lymph
nodes. Thyroid gland, trachea, and esophagus demonstrate no
significant findings.

Lungs/Pleura: Lungs are clear. No pleural effusion or pneumothorax.

Musculoskeletal: No chest wall abnormality. No acute or significant
osseous findings.

Review of the MIP images confirms the above findings.

CTA ABDOMEN AND PELVIS FINDINGS

VASCULAR

Aorta: Normal caliber aorta without aneurysm, dissection, vasculitis
or significant stenosis.

Celiac: Patent without evidence of aneurysm, dissection, vasculitis
or significant stenosis.

SMA: Patent without evidence of aneurysm, dissection, vasculitis or
significant stenosis.

Renals: Both renal arteries are patent without evidence of aneurysm,
dissection, vasculitis, fibromuscular dysplasia or significant
stenosis.

IMA: Patent without evidence of aneurysm, dissection, vasculitis or
significant stenosis.

Inflow: Patent without evidence of aneurysm, dissection, vasculitis
or significant stenosis.

Veins: No obvious venous abnormality within the limitations of this
arterial phase study.

Review of the MIP images confirms the above findings.

NON-VASCULAR

Hepatobiliary: No focal liver abnormality is seen. No gallstones,
gallbladder wall thickening, or biliary dilatation.

Pancreas: Unremarkable. No pancreatic ductal dilatation or
surrounding inflammatory changes.

Spleen: Normal in size without focal abnormality.

Adrenals/Urinary Tract: Adrenal glands are unremarkable. Kidneys are
normal, without renal calculi, focal lesion, or hydronephrosis.
Bladder is unremarkable.

Stomach/Bowel: Stomach is within normal limits. Appendix appears
normal. No evidence of bowel wall thickening, distention, or
inflammatory changes.

Lymphatic: No significant vascular findings are present. No enlarged
abdominal or pelvic lymph nodes.

Reproductive: Uterus and bilateral adnexa are unremarkable.

Other: No abdominal wall hernia or abnormality. No abdominopelvic
ascites.

Musculoskeletal: No acute or significant osseous findings.

Review of the MIP images confirms the above findings.
IMPRESSION: No evidence of thoracic or abdominal aortic dissection or aneurysm.

No significant abnormality seen in the chest, abdomen or pelvis.

## 2019-11-01 MED ORDER — ONDANSETRON 4 MG PO TBDP
4.0000 mg | ORAL_TABLET | Freq: Once | ORAL | Status: AC
  Administered 2019-11-01: 4 mg via ORAL
  Filled 2019-11-01: qty 1

## 2019-11-01 MED ORDER — SODIUM CHLORIDE 0.9 % IV BOLUS
1000.0000 mL | Freq: Once | INTRAVENOUS | Status: AC
  Administered 2019-11-01: 1000 mL via INTRAVENOUS

## 2019-11-01 MED ORDER — MORPHINE SULFATE (PF) 4 MG/ML IV SOLN
4.0000 mg | Freq: Once | INTRAVENOUS | Status: AC
  Administered 2019-11-01: 4 mg via INTRAVENOUS
  Filled 2019-11-01: qty 1

## 2019-11-01 NOTE — ED Provider Notes (Signed)
[facility] EMERGENCY DEPARTMENT Provider Note   CSN: [id number] Arrival date & time: 11/01/19  1572     History Chief Complaint  Patient presents with  . Seizures    Nancy [name] is a 19 y.o. female.  HPI   This patient is a chronically ill 19 year old female who unfortunately has difficult and poorly controlled diabetes, history of seizures, history of chronic pain including neuropathy from her diabetes and a history of congestive heart failure.  She has struggled with her diabetes over time noting that she has this chronic neuropathy which has required pain control.  She has failed gabapentin and Lyrica and has been placed on oxycodone 30 mg every 4 hours as well as sustained release morphine twice a day.  She takes this regularly, she struggles with constipation despite the use of medications to treat opiate related constipation and frequent laxatives.  She also has had seizures for which she takes Lamictal, she was on Keppra in the past but this was discontinued.  Her last admission to the hospital for seizures was in August 2021 during which time she was also noted to be on topiramate for seizure activity as well.  It has been noted that it is not clear whether she has had true seizures or pseudoseizures.  An EEG that was performed August 2 of this year showed no seizure activity, additionally EEGs performed in January of this year and November of last year showed no signs of seizure activity  The patient reportedly had a seizure at home today lasting 3 to 4 minutes, some of the seizure activity was witnessed by the mother.  The patient remembers walking back from her bedroom, she had fallen in the hallway, she does not complain of any specific pain of her head or neck at this time.  There was no postictal state, EMS noted blood sugar to be 340, the patient is notoriously hyperglycemic and uncontrolled despite the use of her medications.  She states she has  chronic nausea which prevents her from eating or drinking that much.  She has taken her Lantus but not her short acting insulin today.  Of note the patient is very quick to tell me that she has not had her morning pain medication is requesting her high-dose oxycodone  Past Medical History:  Diagnosis Date  . Acanthosis nigricans   . Anxiety   . CHF (congestive heart failure) (HCC)   . Chronic lower back pain   . Depression   . DKA, type 1 (HCC) 09/13/2018  . Dyspepsia   . Obesity   . Ovarian cyst    pt is not aware of this hx (11/24/2017)  . 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 Premature baby   . Seizures (HCC)   . Type II diabetes mellitus (HCC)    insulin dependant    Patient Active Problem List   Diagnosis Date Noted  . Cyclical vomiting with nausea 10/01/2019  . Intractable nausea and vomiting 09/30/2019  . Cellulitis   . DKA (diabetic ketoacidoses) 01/27/2019  . Leukocytosis 12/19/2018  . Dehydration   . Pseudoseizures (HCC)   . Type 1 diabetes mellitus with ketoacidosis without coma (HCC) 11/28/2018  . Hyperglycemia due to diabetes mellitus (HCC) 11/27/2018  . 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  . MDD (major depressive disorder), recurrent episode, severe (HCC) 11/06/2018  . Nonspecific abnormal electrocardiogram (ECG) (EKG)   . Chest pain of uncertain etiology   . Hypertensive urgency 10/08/2018  .  Conversion disorder with attacks or seizures, acute episode, with psychological stressor 09/16/2018  . MDD (major depressive disorder), recurrent severe, without psychosis (HCC)   . AKI (acute kidney injury) (HCC) 08/04/2018  . Seizure-like activity (HCC) 08/03/2018  . Depression 07/25/2018  . Syncope 01/30/2018  . Orthostatic hypotension 01/24/2018  . Tachycardia 12/28/2017  . Chronic abdominal pain 12/24/2017  . Chest pain 12/19/2017  . Nausea and vomiting 08/21/2017  . Generalized abdominal pain 08/21/2017  . Non compliance  with medical treatment 01/27/2012  . Adjustment disorder 09/16/2011  . Acanthosis nigricans   . Goiter   . Obesity 06/14/2010  . Hypertension 06/14/2010    Past Surgical History:  Procedure Laterality Date  . ABDOMINAL HERNIA REPAIR     "I was a baby"  . BIOPSY  10/12/2018   Procedure: BIOPSY;  Surgeon: Lynann Bologna, MD;  Location: [facility] ENDOSCOPY;  Service: Endoscopy;;  . ESOPHAGOGASTRODUODENOSCOPY (EGD) WITH PROPOFOL N/A 10/12/2018   Procedure: ESOPHAGOGASTRODUODENOSCOPY (EGD) WITH PROPOFOL;  Surgeon: Lynann Bologna, MD;  Location: [facility] ENDOSCOPY;  Service: Endoscopy;  Laterality: N/A;  . HERNIA REPAIR    . LEFT HEART CATH AND CORONARY ANGIOGRAPHY N/A 10/13/2018   Procedure: LEFT HEART CATH AND CORONARY ANGIOGRAPHY;  Surgeon: Kathleene Hazel, MD;  Location: [facility] INVASIVE CV LAB;  Service: Cardiovascular;  Laterality: N/A;  . TONSILLECTOMY AND ADENOIDECTOMY    . WISDOM TOOTH EXTRACTION  2017     OB History   No obstetric history on file.     Family History  Problem Relation Age of Onset  . Diabetes Mother   . Hypertension Mother   . Obesity Mother   . Asthma Mother   . Allergic rhinitis Mother   . Eczema Mother   . Cervical cancer Mother   . Diabetes Father   . Hypertension Father   . Obesity Father   . Hyperlipidemia Father   . Hypertension Paternal Aunt   . Hypertension Maternal Grandfather   . Colon cancer Maternal Grandfather   . Diabetes Paternal Grandmother   . Obesity Paternal Grandmother   . Diabetes Paternal Grandfather   . Obesity Paternal Grandfather   . Angioedema Neg Hx   . Immunodeficiency Neg Hx   . Urticaria Neg Hx   . Stomach cancer Neg Hx   . Esophageal cancer Neg Hx     Social History   Tobacco Use  . Smoking status: Never Smoker  . Smokeless tobacco: Never Used  Vaping Use  . Vaping Use: Never used  Substance Use Topics  . Alcohol use: No    Alcohol/week: 0.0 standard drinks  . Drug use: No    Home Medications Prior to Admission  medications   Medication Sig Start Date End Date Taking? Authorizing Provider  ARIPiprazole (ABILIFY) 10 MG tablet Take 10 mg by mouth in the morning and at bedtime.     [provider]  atorvastatin (LIPITOR) 20 MG tablet Take 1 tablet (20 mg total) by mouth daily at 6 PM. Patient taking differently: Take 20 mg by mouth in the morning and at bedtime.  10/05/18   Storm Frisk, MD  busPIRone (BUSPAR) 10 MG tablet Take 10 mg by mouth 2 (two) times daily.  11/19/18   [provider]  [place]way Oil-Levomenthol (FDGARD) 25-20.75 MG CAPS Take 2 capsules by mouth as needed. Take as directed as needed Patient not taking: Reported on 10/19/2019 03/21/19   Benancio Deeds, MD  Cyanocobalamin (VITAMIN B-12 PO) Take 1 tablet by mouth daily. Chew one  gummy daily     [provider]  Diclofenac Sodium (VOLTAREN EX) Apply 1 application topically 2 (two) times daily as needed (chest pains).    [provider]  dicyclomine (BENTYL) 10 MG capsule Take 10 mg by mouth 4 (four) times daily as needed for spasms.    [provider]  famotidine (PEPCID) 40 MG tablet Take 40 mg by mouth 2 (two) times daily.    [provider]  hydrOXYzine (ATARAX/VISTARIL) 50 MG tablet Take 50 mg by mouth 2 (two) times daily.    [provider]  insulin aspart (NOVOLOG FLEXPEN) 100 UNIT/ML FlexPen Inject 15 Units into the skin daily. Only takes once a day bc she only eats once a day    [provider]  lamoTRIgine (LAMICTAL) 25 MG tablet Take 2 tablets (50 mg total) by mouth 2 (two) times daily. For mood stabilization Patient taking differently: Take 25 mg by mouth in the morning, at noon, and at bedtime. For mood stabilization 11/11/18   Nwoko, Nicole Kindred I, NP  LANTUS SOLOSTAR 100 UNIT/ML Solostar Pen Inject 70 Units into the skin daily.  07/05/19   [provider]  lidocaine (LIDODERM) 5 % Place 1 patch onto the skin daily as needed (pain). Remove & Discard  patch within 12 hours or as directed by MD    [provider]  lubiprostone (AMITIZA) 24 MCG capsule Take 1 capsule (24 mcg total) by mouth 2 (two) times daily with a meal. Patient not taking: Reported on 10/19/2019 10/27/18   Esterwood, Amy S, PA-C  metoCLOPramide (REGLAN) 5 MG tablet Take 1 tablet (5 mg total) by mouth 3 (three) times daily. 10/03/19   Jerald Kief, MD  mirtazapine (REMERON) 7.5 MG tablet Take 1 tablet (7.5 mg total) by mouth at bedtime. For depression/sleep 11/11/18   Armandina Stammer I, NP  Multiple Vitamin (MULTIVITAMIN WITH MINERALS) TABS tablet Take 1 tablet by mouth daily. 09/18/18   Clapacs, Jackquline Denmark, MD  oxyCODONE (OXYCONTIN) 20 mg 12 hr tablet Take 20 mg by mouth in the morning and at bedtime.     [provider]  pantoprazole (PROTONIX) 40 MG tablet Take 1 tablet (40 mg total) by mouth 2 (two) times daily. For acid reflux 01/29/19   Jae Dire, MD  polyethylene glycol (MIRALAX / GLYCOLAX) 17 g packet Take 17 g by mouth daily. Patient not taking: Reported on 10/19/2019 10/03/19   Jerald Kief, MD  prochlorperazine (COMPAZINE) 25 MG suppository Place 1 suppository (25 mg total) rectally every 12 (twelve) hours as needed for nausea or vomiting. 12/05/18   Melene Plan, DO  propranolol (INDERAL) 20 MG tablet Take 1 tablet (20 mg total) by mouth 2 (two) times daily. For anxiety/HTN 12/23/18   Swayze, Ava, DO  QUEtiapine (SEROQUEL) 100 MG tablet Take 300 mg by mouth at bedtime. 07/05/19   [provider]  QUEtiapine (SEROQUEL) 50 MG tablet Take 50 mg by mouth daily.     [provider]  sucralfate (CARAFATE) 1 GM/10ML suspension Take 1 g by mouth 4 (four) times daily -  with meals and at bedtime.  07/05/19   [provider]  topiramate (TOPAMAX) 25 MG tablet Take 1 tablet (25 mg total) by mouth 2 (two) times daily. For seizure activities 11/11/18   Armandina Stammer I, NP    Allergies    Ibuprofen, Tomato, and Oatmeal  Review of Systems     Review of Systems  All other systems reviewed and are  negative.   Physical Exam Updated Vital Signs BP 131/87   Pulse (!) 104   Resp 17   Ht 1.6 m (5\' 3" )   Wt 95.3 kg   LMP 10/07/2019 (Exact Date)   SpO2 100%   BMI 37.20 kg/m   Physical Exam Vitals and nursing note reviewed.  Constitutional:      General: She is not in acute distress.    Appearance: She is well-developed.  HENT:     Head: Normocephalic and atraumatic.     Mouth/Throat:     Mouth: Mucous membranes are moist.     Pharynx: No oropharyngeal exudate.     Comments: Very poor dentition Eyes:     General: No scleral icterus.       Right eye: No discharge.        Left eye: No discharge.     Conjunctiva/sclera: Conjunctivae normal.     Pupils: Pupils are equal, round, and reactive to light.  Neck:     Thyroid: No thyromegaly.     Vascular: No JVD.  Cardiovascular:     Rate and Rhythm: Normal rate and regular rhythm.     Heart sounds: Normal heart sounds. No murmur heard.  No friction rub. No gallop.   Pulmonary:     Effort: Pulmonary effort is normal. No respiratory distress.     Breath sounds: Normal breath sounds. No wheezing or rales.  Abdominal:     General: Bowel sounds are normal. There is no distension.     Palpations: Abdomen is soft. There is no mass.     Tenderness: There is no abdominal tenderness.  Musculoskeletal:        General: No tenderness. Normal range of motion.     Cervical back: Normal range of motion and neck supple.  Lymphadenopathy:     Cervical: No cervical adenopathy.  Skin:    General: Skin is warm and dry.     Findings: No erythema or rash.  Neurological:     Mental Status: She is alert.     Coordination: Coordination normal.     Comments: The patient is awake alert and able to answer all of my questions appropriately, she speaks clearly without any difficulty.  She is able to move all 4 extremities with normal strength, she has no facial droop, her memory is completely  intact  Psychiatric:        Behavior: Behavior normal.     ED Results / Procedures / Treatments   Labs (all labs ordered are listed, but only abnormal results are displayed) Labs Reviewed  BASIC METABOLIC PANEL - Abnormal; Notable for the following components:      Result Value   Glucose, Bld 302 (*)    All other components within normal limits  CBC - Abnormal; Notable for the following components:   MCH 24.6 (*)    All other components within normal limits  CBG MONITORING, ED - Abnormal; Notable for the following components:   Glucose-Capillary 300 (*)    All other components within normal limits    EKG None  Radiology No results found.  Procedures Procedures (including critical care time)  Medications Ordered in ED Medications  sodium chloride 0.9 % bolus 1,000 mL (0 mLs Intravenous Stopped 11/01/19 1116)  ondansetron (ZOFRAN-ODT) disintegrating tablet 4 mg (4 mg Oral Given 11/01/19 0908)  morphine 4 MG/ML injection 4 mg (4 mg Intravenous Given 11/01/19 0908)    ED Course  I have reviewed the triage vital signs and  the nursing notes.  Pertinent labs & imaging results that were available during my care of the patient were reviewed by me and considered in my medical decision making (see chart for details).    MDM Rules/Calculators/A&P                          This patient unfortunately has had significant difficulties with her diabetes as well as with possible seizures or pseudoseizures.  Of note it is clear that the patient is taking medications that are mood stabilizers as well suggesting that there is more of a pseudoseizure aspect of this is no EEG evidence of seizure activity has been seen on multiple recent EEGs.  There is no postictal state, no incontinence or tongue biting, the patient is totally normal at this time and complains only of her chronic pain.  I am concerned about her blood sugar being elevated, I will make sure she is not in DKA with lab work and  give her some IV fluids and some insulin, the patient is agreeable to the plan, I do not think she needs any acute antiepileptic medications  Labs show no DKA  Labs show no leukocytosis or anemia  Vital signs have remained in a reasonable range and the patient has not had any further seizures or seizure-like activity or altered mental status.  She continues to ask for her home pain medication, she can take when she gets home, discharging now  Final Clinical Impression(s) / ED Diagnoses Final diagnoses:  Seizure-like activity (HCC)  Hyperglycemia    Rx / DC Orders ED Discharge Orders    None       Eber Hong, MD 11/01/19 1122

## 2019-11-01 NOTE — ED Notes (Signed)
Patient noted with shaking motion, when asked was that how she do when she have a seizures she stated no I'm just rocking.

## 2019-11-01 NOTE — Discharge Instructions (Signed)
Please make sure that you are taking your medication such as Lamictal for your seizures as appropriate and prescribed by your doctor.  Also please make sure that you take your insulin, even though you are not eating very much your blood sugar can still creep up.  When you get home you may take your home pain medication, drink plenty of clear liquids and seek medical exam with your doctor for ongoing evaluation or the emergency department for worsening symptoms

## 2019-11-01 NOTE — ED Triage Notes (Signed)
Patient arrived via [facility], stating patient had a witness seizure by mom that lasted about 3-4 minutes. Denies any trauma, incontinence or losing consciousness. Per EMS patient was alert and oriented x 4 the entire time. Patient has a history of seizures. Patient CBG 349 has a history of DM. Took Lantus 70units this morning.  Patient c/o abdominal pain all over and a headache.  Patient believes her stomach pain is coming from the pain pills. Oxycodone and Morphine.

## 2019-11-07 IMAGING — DX DG CHEST 1V PORT
1 series · 1 of 1 positions shown · non-contrast
Comparison: 10/30/2018

CLINICAL DATA: Left chest pain.

EXAM:
PORTABLE CHEST 1 VIEW

[chest ap]
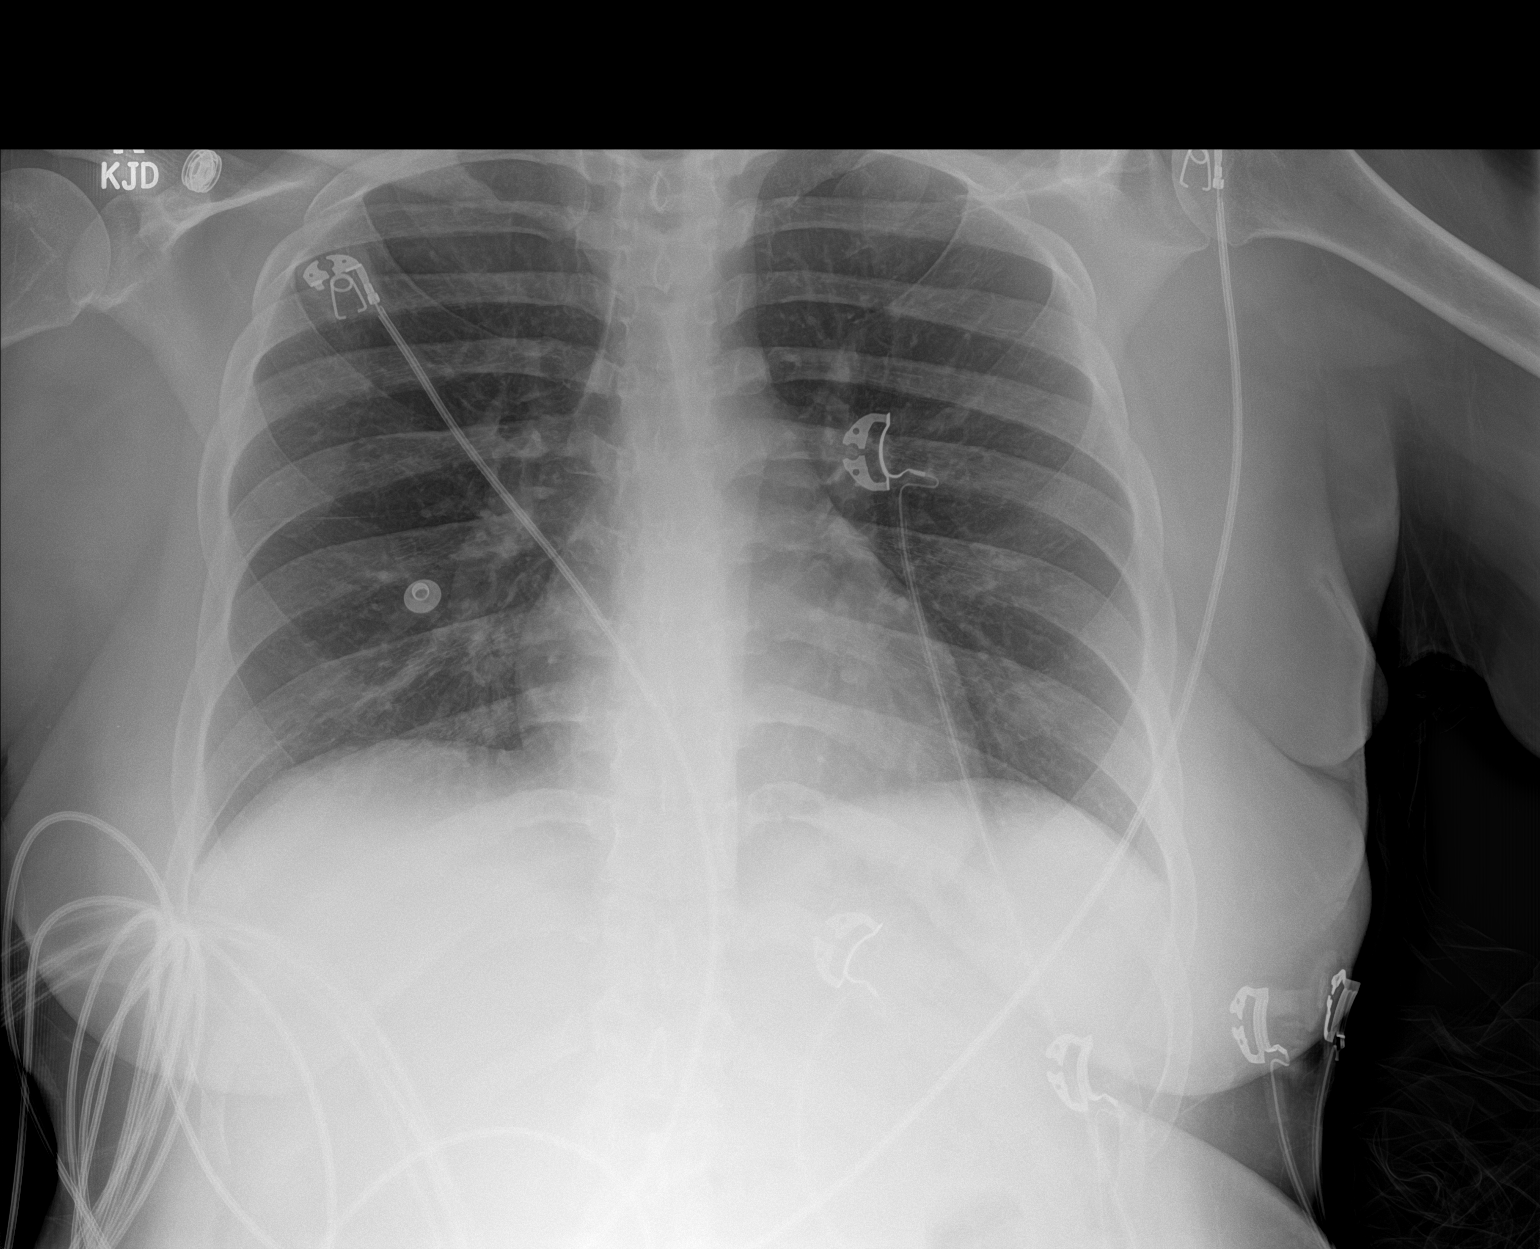

[1 of 1 positions shown; findings below may reference images not displayed]

FINDINGS: The heart size and mediastinal contours are within normal limits.
Both lungs are clear. The visualized skeletal structures are
unremarkable.
IMPRESSION: Normal examination.

## 2019-11-13 ENCOUNTER — Other Ambulatory Visit: Payer: Self-pay

## 2019-11-13 ENCOUNTER — Emergency Department (HOSPITAL_COMMUNITY)
Admission: EM | Admit: 2019-11-13 | Discharge: 2019-11-13 | Disposition: A | Discharge status: Home / Self Care | Payer: Medicaid Other | Attending: Emergency Medicine | Admitting: Emergency Medicine

## 2019-11-13 ENCOUNTER — Emergency Department (HOSPITAL_COMMUNITY): Payer: Medicaid Other

## 2019-11-13 ENCOUNTER — Encounter (HOSPITAL_COMMUNITY): Payer: Self-pay | Admitting: Emergency Medicine

## 2019-11-13 DIAGNOSIS — Z794 Long term (current) use of insulin: Secondary | ICD-10-CM | POA: Insufficient documentation

## 2019-11-13 DIAGNOSIS — Z955 Presence of coronary angioplasty implant and graft: Secondary | ICD-10-CM | POA: Diagnosis not present

## 2019-11-13 DIAGNOSIS — I509 Heart failure, unspecified: Secondary | ICD-10-CM | POA: Diagnosis not present

## 2019-11-13 DIAGNOSIS — E1065 Type 1 diabetes mellitus with hyperglycemia: Secondary | ICD-10-CM | POA: Insufficient documentation

## 2019-11-13 DIAGNOSIS — R569 Unspecified convulsions: Secondary | ICD-10-CM | POA: Diagnosis not present

## 2019-11-13 DIAGNOSIS — R0789 Other chest pain: Secondary | ICD-10-CM | POA: Insufficient documentation

## 2019-11-13 DIAGNOSIS — L723 Sebaceous cyst: Secondary | ICD-10-CM | POA: Diagnosis not present

## 2019-11-13 DIAGNOSIS — E1010 Type 1 diabetes mellitus with ketoacidosis without coma: Secondary | ICD-10-CM | POA: Diagnosis not present

## 2019-11-13 DIAGNOSIS — I110 Hypertensive heart disease with heart failure: Secondary | ICD-10-CM | POA: Diagnosis not present

## 2019-11-13 DIAGNOSIS — Z79899 Other long term (current) drug therapy: Secondary | ICD-10-CM | POA: Diagnosis not present

## 2019-11-13 DIAGNOSIS — L729 Follicular cyst of the skin and subcutaneous tissue, unspecified: Secondary | ICD-10-CM

## 2019-11-13 LAB — BASIC METABOLIC PANEL
Anion gap: 12 (ref 5–15)
BUN: 6 mg/dL (ref 6–20)
CO2: 24 mmol/L (ref 22–32)
Calcium: 9.1 mg/dL (ref 8.9–10.3)
Chloride: 98 mmol/L (ref 98–111)
Creatinine, Ser: 0.65 mg/dL (ref 0.44–1.00)
GFR, Estimated: >60 mL/min (ref >60)
Glucose, Bld: 339 mg/dL — ABNORMAL HIGH (ref 70–99)
Potassium: 3.8 mmol/L (ref 3.5–5.1)
Sodium: 134 mmol/L — ABNORMAL LOW (ref 135–145)

## 2019-11-13 LAB — CBC
HCT: 41.1 % (ref 36.0–46.0)
Hemoglobin: 12.7 g/dL (ref 12.0–15.0)
MCH: 25.1 pg — ABNORMAL LOW (ref 26.0–34.0)
MCHC: 30.9 g/dL (ref 30.0–36.0)
MCV: 81.2 fL (ref 80.0–100.0)
Platelets: 313 10*3/uL (ref 150–400)
RBC: 5.06 MIL/uL (ref 3.87–5.11)
RDW: 15.1 % (ref 11.5–15.5)
WBC: 8.3 10*3/uL (ref 4.0–10.5)
nRBC: 0 % (ref 0.0–0.2)

## 2019-11-13 LAB — TROPONIN I (HIGH SENSITIVITY)
Troponin I (High Sensitivity): <2 ng/L (ref <18)
Troponin I (High Sensitivity): <2 ng/L (ref <18)

## 2019-11-13 LAB — I-STAT BETA HCG BLOOD, ED (MC, WL, AP ONLY): I-stat hCG, quantitative: <5 m[IU]/mL (ref <5)

## 2019-11-13 MED ORDER — CEPHALEXIN 500 MG PO CAPS: 500.0000 mg | ORAL_CAPSULE | Freq: Three times a day (TID) | ORAL | 0 refills | Status: AC

## 2019-11-13 MED ORDER — HYDROCODONE-ACETAMINOPHEN 5-325 MG PO TABS
1.0000 | ORAL_TABLET | Freq: Once | ORAL | Status: DC
  Filled 2019-11-13: qty 1

## 2019-11-13 MED ORDER — OXYCODONE-ACETAMINOPHEN 5-325 MG PO TABS
2.0000 | ORAL_TABLET | Freq: Once | ORAL | Status: AC
  Administered 2019-11-13: 2 via ORAL
  Filled 2019-11-13: qty 2

## 2019-11-13 NOTE — ED Provider Notes (Signed)
[facility] EMERGENCY DEPARTMENT Provider Note   CSN: [id number] Arrival date & time: 11/13/19  1435     History Chief Complaint  Patient presents with  . Chest Pain  . Seizures    Nancy [name] is a 19 y.o. 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  Patient states she typically has about 6 seizure-like episodes daily.  Today however she had about 10 therefore she presents for evaluation.  The quality of the seizure-like episodes is unchanged from her baseline with full body jerking.  She has not missed any doses of daily medications. Last episode today stopped about 5-10 min before EMS arrived. Started 0745 this morning.   She reports left lower jaw swelling with painful "knot" that has been there for some time. She bumped it today during a seizure-like episode and the swelling and pain have worsened.  Patient also endorses left-sided anterior chest wall pain that is mostly under her breast.  This began after her seizures today.  Pain is sharp stabbing pain, worse with deep breathing.  No shortness of breath.  Reports her diabetes has been "better." No recent illnesses.   The history is provided by the patient and medical records.       Past Medical History:  Diagnosis Date  . Acanthosis nigricans   . Anxiety   . CHF (congestive heart failure) (HCC)   . Chronic lower back pain   . Depression   . DKA, type 1 (HCC) 09/13/2018  . Dyspepsia   . Obesity   . Ovarian cyst    pt is not aware of this hx (11/24/2017)  . 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 Premature baby   . Seizures (HCC)   . Type II diabetes mellitus (HCC)    insulin dependant    Patient Active Problem List   Diagnosis Date Noted  . Cyclical vomiting with nausea 10/01/2019  . Intractable nausea and vomiting 09/30/2019  . Cellulitis   . DKA (diabetic ketoacidoses) 01/27/2019  . Leukocytosis 12/19/2018    . Dehydration   . Pseudoseizures (HCC)   . Type 1 diabetes mellitus with ketoacidosis without coma (HCC) 11/28/2018  . Hyperglycemia due to diabetes mellitus (HCC) 11/27/2018  . 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  . MDD (major depressive disorder), recurrent episode, severe (HCC) 11/06/2018  . Nonspecific abnormal electrocardiogram (ECG) (EKG)   . Chest pain of uncertain etiology   . Hypertensive urgency 10/08/2018  . Conversion disorder with attacks or seizures, acute episode, with psychological stressor 09/16/2018  . MDD (major depressive disorder), recurrent severe, without psychosis (HCC)   . AKI (acute kidney injury) (HCC) 08/04/2018  . Seizure-like activity (HCC) 08/03/2018  . Depression 07/25/2018  . Syncope 01/30/2018  . Orthostatic hypotension 01/24/2018  . Tachycardia 12/28/2017  . Chronic abdominal pain 12/24/2017  . Chest pain 12/19/2017  . Nausea and vomiting 08/21/2017  . Generalized abdominal pain 08/21/2017  . Non compliance with medical treatment 01/27/2012  . Adjustment disorder 09/16/2011  . Acanthosis nigricans   . Goiter   . Obesity 06/14/2010  . Hypertension 06/14/2010    Past Surgical History:  Procedure Laterality Date  . ABDOMINAL HERNIA REPAIR     "I was a baby"  . BIOPSY  10/12/2018   Procedure: BIOPSY;  Surgeon: Lynann Bologna, MD;  Location: [facility] ENDOSCOPY;  Service: Endoscopy;;  . ESOPHAGOGASTRODUODENOSCOPY (EGD) WITH PROPOFOL N/A 10/12/2018   Procedure: ESOPHAGOGASTRODUODENOSCOPY (EGD) WITH PROPOFOL;  Surgeon: Lynann Bologna, MD;  Location: [facility] ENDOSCOPY;  Service: Endoscopy;  Laterality: N/A;  . HERNIA REPAIR    . LEFT HEART CATH AND CORONARY ANGIOGRAPHY N/A 10/13/2018   Procedure: LEFT HEART CATH AND CORONARY ANGIOGRAPHY;  Surgeon: Kathleene Hazel, MD;  Location: [facility] INVASIVE CV LAB;  Service: Cardiovascular;  Laterality: N/A;  . TONSILLECTOMY AND ADENOIDECTOMY    . WISDOM TOOTH EXTRACTION  2017      OB History   No obstetric history on file.     Family History  Problem Relation Age of Onset  . Diabetes Mother   . Hypertension Mother   . Obesity Mother   . Asthma Mother   . Allergic rhinitis Mother   . Eczema Mother   . Cervical cancer Mother   . Diabetes Father   . Hypertension Father   . Obesity Father   . Hyperlipidemia Father   . Hypertension Paternal Aunt   . Hypertension Maternal Grandfather   . Colon cancer Maternal Grandfather   . Diabetes Paternal Grandmother   . Obesity Paternal Grandmother   . Diabetes Paternal Grandfather   . Obesity Paternal Grandfather   . Angioedema Neg Hx   . Immunodeficiency Neg Hx   . Urticaria Neg Hx   . Stomach cancer Neg Hx   . Esophageal cancer Neg Hx     Social History   Tobacco Use  . Smoking status: Never Smoker  . Smokeless tobacco: Never Used  Vaping Use  . Vaping Use: Never used  Substance Use Topics  . Alcohol use: No    Alcohol/week: 0.0 standard drinks  . Drug use: No    Home Medications Prior to Admission medications   Medication Sig Start Date End Date Taking? Authorizing Provider  ARIPiprazole (ABILIFY) 10 MG tablet Take 10 mg by mouth in the morning and at bedtime.     [provider]  atorvastatin (LIPITOR) 20 MG tablet Take 1 tablet (20 mg total) by mouth daily at 6 PM. Patient taking differently: Take 20 mg by mouth in the morning and at bedtime.  10/05/18   Storm Frisk, MD  busPIRone (BUSPAR) 10 MG tablet Take 10 mg by mouth 2 (two) times daily.  11/19/18   [provider]  [place]way Oil-Levomenthol (FDGARD) 25-20.75 MG CAPS Take 2 capsules by mouth as needed. Take as directed as needed Patient not taking: Reported on 10/19/2019 03/21/19   Benancio Deeds, MD  cephALEXin (KEFLEX) 500 MG capsule Take 1 capsule (500 mg total) by mouth 3 (three) times daily for 7 days. 11/13/19 11/20/19  Aiva Miskell, [place] N, PA-C  Cyanocobalamin (VITAMIN B-12 PO) Take 1 tablet by mouth daily.  Chew one gummy daily     [provider]  Diclofenac Sodium (VOLTAREN EX) Apply 1 application topically 2 (two) times daily as needed (chest pains).    [provider]  dicyclomine (BENTYL) 10 MG capsule Take 10 mg by mouth 4 (four) times daily as needed for spasms.    [provider]  famotidine (PEPCID) 40 MG tablet Take 40 mg by mouth 2 (two) times daily.    [provider]  hydrOXYzine (ATARAX/VISTARIL) 50 MG tablet Take 50 mg by mouth 2 (two) times daily.    [provider]  insulin aspart (NOVOLOG FLEXPEN) 100 UNIT/ML FlexPen Inject 15 Units into the skin daily. Only takes once a day bc she only eats once a day    [provider]  lamoTRIgine (LAMICTAL) 25 MG tablet Take 2  tablets (50 mg total) by mouth 2 (two) times daily. For mood stabilization Patient taking differently: Take 25 mg by mouth in the morning, at noon, and at bedtime. For mood stabilization 11/11/18   Nwoko, Nicole Kindred I, NP  LANTUS SOLOSTAR 100 UNIT/ML Solostar Pen Inject 70 Units into the skin daily.  07/05/19   [provider]  lidocaine (LIDODERM) 5 % Place 1 patch onto the skin daily as needed (pain). Remove & Discard patch within 12 hours or as directed by MD    [provider]  lubiprostone (AMITIZA) 24 MCG capsule Take 1 capsule (24 mcg total) by mouth 2 (two) times daily with a meal. Patient not taking: Reported on 10/19/2019 10/27/18   Esterwood, Amy S, PA-C  metoCLOPramide (REGLAN) 5 MG tablet Take 1 tablet (5 mg total) by mouth 3 (three) times daily. 10/03/19   Jerald Kief, MD  mirtazapine (REMERON) 7.5 MG tablet Take 1 tablet (7.5 mg total) by mouth at bedtime. For depression/sleep 11/11/18   Armandina Stammer I, NP  Multiple Vitamin (MULTIVITAMIN WITH MINERALS) TABS tablet Take 1 tablet by mouth daily. 09/18/18   Clapacs, Jackquline Denmark, MD  oxyCODONE (OXYCONTIN) 20 mg 12 hr tablet Take 20 mg by mouth in the morning and at bedtime.     [provider]   pantoprazole (PROTONIX) 40 MG tablet Take 1 tablet (40 mg total) by mouth 2 (two) times daily. For acid reflux 01/29/19   Jae Dire, MD  polyethylene glycol (MIRALAX / GLYCOLAX) 17 g packet Take 17 g by mouth daily. Patient not taking: Reported on 10/19/2019 10/03/19   Jerald Kief, MD  prochlorperazine (COMPAZINE) 25 MG suppository Place 1 suppository (25 mg total) rectally every 12 (twelve) hours as needed for nausea or vomiting. 12/05/18   Melene Plan, DO  propranolol (INDERAL) 20 MG tablet Take 1 tablet (20 mg total) by mouth 2 (two) times daily. For anxiety/HTN 12/23/18   Swayze, Ava, DO  QUEtiapine (SEROQUEL) 100 MG tablet Take 300 mg by mouth at bedtime. 07/05/19   [provider]  QUEtiapine (SEROQUEL) 50 MG tablet Take 50 mg by mouth daily.     [provider]  sucralfate (CARAFATE) 1 GM/10ML suspension Take 1 g by mouth 4 (four) times daily -  with meals and at bedtime.  07/05/19   [provider]  topiramate (TOPAMAX) 25 MG tablet Take 1 tablet (25 mg total) by mouth 2 (two) times daily. For seizure activities 11/11/18   Armandina Stammer I, NP    Allergies    Ibuprofen, Tomato, and Oatmeal  Review of Systems   Review of Systems  HENT: Positive for facial swelling.   Musculoskeletal:       Left chest wall pain  Neurological: Positive for seizures and headaches.  All other systems reviewed and are negative.   Physical Exam Updated Vital Signs BP (!) 132/95   Pulse 98   Temp 98.6 F (37 C) (Oral)   Resp 18   Ht 5\' 3"  (1.6 m)   Wt 95.3 kg   LMP 11/06/2019   SpO2 100%   BMI 37.20 kg/m   Physical Exam Vitals and nursing note reviewed.  Constitutional:      General: She is not in acute distress.    Appearance: She is well-developed. She is not ill-appearing.  HENT:     Head: Normocephalic and atraumatic.     Mouth/Throat:     Comments: Left mid mandible with 2 to 3 cm fluctuant mass  with overlying erythema and tenderness.  No significant  surrounding signs of erythema or induration.   poor dentition throughout mouth.  No intraoral abscess noted.  No sublingual edema or tenderness. Eyes:     Conjunctiva/sclera: Conjunctivae normal.  Cardiovascular:     Rate and Rhythm: Normal rate and regular rhythm.  Pulmonary:     Effort: Pulmonary effort is normal. No respiratory distress.     Breath sounds: Normal breath sounds.  Chest:     Chest wall: Tenderness (Anterior left chest wall below the breast.  No skin changes or crepitus.  No deformities) present.  Abdominal:     General: Bowel sounds are normal.     Palpations: Abdomen is soft.     Tenderness: There is no abdominal tenderness.  Skin:    General: Skin is warm.  Neurological:     Mental Status: She is alert.     Comments: Mental Status:  Alert, oriented, thought content appropriate, able to give a coherent history. Speech fluent without evidence of aphasia. Able to follow 2 step commands without difficulty.  Cranial Nerves:  II:  Peripheral visual fields grossly normal, pupils equal, round, reactive to light III,IV, VI: ptosis not present, extra-ocular motions intact bilaterally  V,VII: smile symmetric, facial light touch sensation equal VIII: hearing grossly normal to voice  X: uvula elevates symmetrically  XI: bilateral shoulder shrug symmetric and strong XII: midline tongue extension without fassiculations Motor:  Normal tone. 5/5 strength in upper and lower extremities bilaterally including strong and equal grip strength and dorsiflexion/plantar flexion Sensory: grossly normal in all extremities.  Cerebellar: normal finger-to-nose with bilateral upper extremities CV: distal pulses palpable throughout    Psychiatric:        Behavior: Behavior normal.     ED Results / Procedures / Treatments   Labs (all labs ordered are listed, but only abnormal results are displayed) Labs Reviewed  BASIC METABOLIC PANEL - Abnormal; Notable for the following components:       Result Value   Sodium 134 (*)    Glucose, Bld 339 (*)    All other components within normal limits  CBC - Abnormal; Notable for the following components:   MCH 25.1 (*)    All other components within normal limits  I-STAT BETA HCG BLOOD, ED ([facility], [facility], [facility] ONLY)  TROPONIN I (HIGH SENSITIVITY)  TROPONIN I (HIGH SENSITIVITY)    EKG EKG Interpretation  Date/Time:  Sunday November 13 2019 14:46:08 EST Ventricular Rate:  114 PR Interval:  114 QRS Duration: 84 QT Interval:  328 QTC Calculation: 452 R Axis:   88 Text Interpretation: Sinus tachycardia Abnormal QRS-T angle, consider primary T wave abnormality Abnormal ECG No significant change since last tracing Confirmed by Melene Plan [phone]) on 11/13/2019 3:09:01 PM   Radiology DG Chest 2 View  Result Date: 11/13/2019 CLINICAL DATA:  Chest pain for 2 hours, multiple breakthrough seizures today, shortness of breath EXAM: CHEST - 2 VIEW COMPARISON:  10/19/2019 FINDINGS: Normal heart size, mediastinal contours, and pulmonary vascularity. Lungs clear. No pleural effusion or pneumothorax. Bones unremarkable. IMPRESSION: Normal exam. Electronically Signed   By: Ulyses Southward M.D.   On: 11/13/2019 16:20    Procedures Ultrasound ED Soft Tissue  Date/Time: 11/13/2019 6:23 PM Performed by: Mcgregor Tinnon, [place] N, PA-C Authorized by: Ananias Kolander, [place] N, PA-C   Procedure details:    Indications comment:  Evaluate for abscess   Transverse view:  Visualized   Longitudinal view:  Visualized   Images: archived  Location:    Location: face     Side:  Left Findings:     no abscess present Comments:     Heterogenous appearing subcutaneous mass.  No drainable fluid collection is noted or significant signs of cellulitis.   (including critical care time)  Medications Ordered in ED Medications  oxyCODONE-acetaminophen (PERCOCET/ROXICET) 5-325 MG per tablet 2 tablet (2 tablets Oral Given 11/13/19 1703)    ED Course  I have reviewed the triage  vital signs and the nursing notes.  Pertinent labs & imaging results that were available during my care of the patient were reviewed by me and considered in my medical decision making (see chart for details).    MDM Rules/Calculators/A&P                          Patient presenting for increased frequency of seizure-like activity.  Seizure-like activity has not changed in quality, only quantity today.  No recent illness.  She did hit her left face where she had chronic "knot" to her face which is now more swollen and painful after bumping it today during seizure-like activity.  She also has chest wall pain from injury today.  On exam she is well-appearing and in no distress.  Heart and lung sounds are negative.  She has anterior chest wall tenderness on the left without deformity.  Chest x-ray is negative.  Left face with fluctuant erythematous mass, bedside ultrasound performed which appears to be cystic in nature.  No drainable fluid collection or significant signs of cellulitis.  However given patient's history of uncontrolled diabetes, consider possible developing cellulitis over likely subcutaneous mass, will cover with Keflex and provide dermatology follow-up.  In the meantime she is instructed to follow with PCP for recheck.  Regarding patient's seizure-like activity, per chart review it is suspected she may be having pseudoseizures with multiple normal recent EEGs.  Laboratory work-up today is unremarkable with the exception of hyperglycemia without DKA.  No recurrent episodes since arriving to the ED, no interventions given for seizure-like activity.  No focal neuro deficits noted on exam.  Recommend she follow closely with neurologist and return for any concerning symptoms.  Patient is well-appearing, agreeable to plan, appropriate for discharge.  Patient discussed with Dr. Adela Lank, agrees with work-up and care plan for discharge at this time.  Final Clinical Impression(s) / ED Diagnoses Final  diagnoses:  Seizure-like activity (HCC)  Chest wall pain  Subcutaneous cyst    Rx / DC Orders ED Discharge Orders         Ordered    cephALEXin (KEFLEX) 500 MG capsule  3 times daily        11/13/19 1813           Roanna Reaves, [place] N, PA-C 11/13/19 1852    Melene Plan, DO 11/13/19 1947

## 2019-11-13 NOTE — ED Notes (Signed)
Patient transported to X-ray 

## 2019-11-13 NOTE — Discharge Instructions (Signed)
Please follow with your neurologist regarding your increased frequency of seizure like episodes. Take your home medications as needed for chest wall pain. Continue applying warm compresses to your face. Follow with your primary care provider for follow-up of the cyst on your face.  Take the antibiotic as prescribed.  You can schedule appoint with dermatology.

## 2019-11-13 NOTE — ED Triage Notes (Addendum)
Pt reports having 10 seizures since 7:45am that have been witnessed by her mom.  Takes Lamictal for seizures.  States she normally has break through seizures but today has been worse.  Also c/o pain to center of chest x 2 hours with SOB.  Swelling to L jaw from fall during seizure.

## 2019-11-21 ENCOUNTER — Emergency Department (HOSPITAL_COMMUNITY)
Admission: EM | Admit: 2019-11-21 | Discharge: 2019-11-21 | Disposition: A | Discharge status: Home / Self Care | Payer: Medicaid Other | Attending: Emergency Medicine | Admitting: Emergency Medicine

## 2019-11-21 ENCOUNTER — Emergency Department (HOSPITAL_COMMUNITY): Payer: Medicaid Other

## 2019-11-21 DIAGNOSIS — I110 Hypertensive heart disease with heart failure: Secondary | ICD-10-CM | POA: Insufficient documentation

## 2019-11-21 DIAGNOSIS — R112 Nausea with vomiting, unspecified: Secondary | ICD-10-CM | POA: Insufficient documentation

## 2019-11-21 DIAGNOSIS — Z794 Long term (current) use of insulin: Secondary | ICD-10-CM | POA: Diagnosis not present

## 2019-11-21 DIAGNOSIS — E119 Type 2 diabetes mellitus without complications: Secondary | ICD-10-CM | POA: Diagnosis not present

## 2019-11-21 DIAGNOSIS — I509 Heart failure, unspecified: Secondary | ICD-10-CM | POA: Insufficient documentation

## 2019-11-21 DIAGNOSIS — R109 Unspecified abdominal pain: Secondary | ICD-10-CM

## 2019-11-21 DIAGNOSIS — Z79899 Other long term (current) drug therapy: Secondary | ICD-10-CM | POA: Diagnosis not present

## 2019-11-21 DIAGNOSIS — R1084 Generalized abdominal pain: Secondary | ICD-10-CM | POA: Insufficient documentation

## 2019-11-21 LAB — CBC WITH DIFFERENTIAL/PLATELET
Abs Immature Granulocytes: 0.05 10*3/uL (ref 0.00–0.07)
Basophils Absolute: 0 10*3/uL (ref 0.0–0.1)
Basophils Relative: 0 %
Eosinophils Absolute: 0 10*3/uL (ref 0.0–0.5)
Eosinophils Relative: 0 %
HCT: 43.1 % (ref 36.0–46.0)
Hemoglobin: 13.9 g/dL (ref 12.0–15.0)
Immature Granulocytes: 0 %
Lymphocytes Relative: 16 %
Lymphs Abs: 1.7 10*3/uL (ref 0.7–4.0)
MCH: 25.6 pg — ABNORMAL LOW (ref 26.0–34.0)
MCHC: 32.3 g/dL (ref 30.0–36.0)
MCV: 79.5 fL — ABNORMAL LOW (ref 80.0–100.0)
Monocytes Absolute: 0.7 10*3/uL (ref 0.1–1.0)
Monocytes Relative: 6 %
Neutro Abs: 8.7 10*3/uL — ABNORMAL HIGH (ref 1.7–7.7)
Neutrophils Relative %: 78 %
Platelets: 350 10*3/uL (ref 150–400)
RBC: 5.42 MIL/uL — ABNORMAL HIGH (ref 3.87–5.11)
RDW: 14.9 % (ref 11.5–15.5)
WBC: 11.2 10*3/uL — ABNORMAL HIGH (ref 4.0–10.5)
nRBC: 0 % (ref 0.0–0.2)

## 2019-11-21 LAB — COMPREHENSIVE METABOLIC PANEL
ALT: 13 U/L (ref 0–44)
AST: 24 U/L (ref 15–41)
Albumin: 4.2 g/dL (ref 3.5–5.0)
Alkaline Phosphatase: 85 U/L (ref 38–126)
Anion gap: 20 — ABNORMAL HIGH (ref 5–15)
BUN: 10 mg/dL (ref 6–20)
CO2: 19 mmol/L — ABNORMAL LOW (ref 22–32)
Calcium: 9.4 mg/dL (ref 8.9–10.3)
Chloride: 93 mmol/L — ABNORMAL LOW (ref 98–111)
Creatinine, Ser: 0.8 mg/dL (ref 0.44–1.00)
GFR, Estimated: >60 mL/min (ref >60)
Glucose, Bld: 411 mg/dL — ABNORMAL HIGH (ref 70–99)
Potassium: 5 mmol/L (ref 3.5–5.1)
Sodium: 132 mmol/L — ABNORMAL LOW (ref 135–145)
Total Bilirubin: 1.8 mg/dL — ABNORMAL HIGH (ref 0.3–1.2)
Total Protein: 7.4 g/dL (ref 6.5–8.1)

## 2019-11-21 LAB — I-STAT VENOUS BLOOD GAS, ED
Acid-Base Excess: 2 mmol/L (ref 0.0–2.0)
Bicarbonate: 26 mmol/L (ref 20.0–28.0)
Calcium, Ion: 1.05 mmol/L — ABNORMAL LOW (ref 1.15–1.40)
HCT: 45 % (ref 36.0–46.0)
Hemoglobin: 15.3 g/dL — ABNORMAL HIGH (ref 12.0–15.0)
O2 Saturation: 100 %
Potassium: 5 mmol/L (ref 3.5–5.1)
Sodium: 131 mmol/L — ABNORMAL LOW (ref 135–145)
TCO2: 27 mmol/L (ref 22–32)
pCO2, Ven: 36 mmHg — ABNORMAL LOW (ref 44.0–60.0)
pH, Ven: 7.467 — ABNORMAL HIGH (ref 7.250–7.430)
pO2, Ven: 158 mmHg — ABNORMAL HIGH (ref 32.0–45.0)

## 2019-11-21 LAB — URINALYSIS, ROUTINE W REFLEX MICROSCOPIC
Bilirubin Urine: NEGATIVE
Glucose, UA: >=500 mg/dL — AB
Hgb urine dipstick: NEGATIVE
Ketones, ur: 80 mg/dL — AB
Leukocytes,Ua: NEGATIVE
Nitrite: NEGATIVE
Protein, ur: NEGATIVE mg/dL
Specific Gravity, Urine: 1.035 — ABNORMAL HIGH (ref 1.005–1.030)
pH: 6 (ref 5.0–8.0)

## 2019-11-21 LAB — I-STAT BETA HCG BLOOD, ED (MC, WL, AP ONLY): I-stat hCG, quantitative: <5 m[IU]/mL (ref <5)

## 2019-11-21 LAB — CBG MONITORING, ED: Glucose-Capillary: 383 mg/dL — ABNORMAL HIGH (ref 70–99)

## 2019-11-21 LAB — LIPASE, BLOOD: Lipase: 24 U/L (ref 11–51)

## 2019-11-21 MED ORDER — SODIUM CHLORIDE 0.9 % IV SOLN: INTRAVENOUS | Status: DC

## 2019-11-21 MED ORDER — SODIUM CHLORIDE 0.9 % IV BOLUS
1000.0000 mL | Freq: Once | INTRAVENOUS | Status: AC
  Administered 2019-11-21: 1000 mL via INTRAVENOUS

## 2019-11-21 MED ORDER — PROCHLORPERAZINE EDISYLATE 10 MG/2ML IJ SOLN
10.0000 mg | Freq: Once | INTRAMUSCULAR | Status: AC
  Administered 2019-11-21: 10 mg via INTRAVENOUS
  Filled 2019-11-21: qty 2

## 2019-11-21 MED ORDER — HALOPERIDOL LACTATE 5 MG/ML IJ SOLN
1.0000 mg | Freq: Once | INTRAMUSCULAR | Status: AC
  Administered 2019-11-21: 1 mg via INTRAVENOUS
  Filled 2019-11-21: qty 1

## 2019-11-21 MED ORDER — MORPHINE SULFATE (PF) 4 MG/ML IV SOLN
4.0000 mg | Freq: Once | INTRAVENOUS | Status: AC
  Administered 2019-11-21: 4 mg via INTRAVENOUS
  Filled 2019-11-21: qty 1

## 2019-11-21 MED ORDER — FAMOTIDINE IN NACL 20-0.9 MG/50ML-% IV SOLN
20.0000 mg | Freq: Once | INTRAVENOUS | Status: AC
  Administered 2019-11-21: 20 mg via INTRAVENOUS
  Filled 2019-11-21: qty 50

## 2019-11-21 MED ORDER — INSULIN ASPART 100 UNIT/ML ~~LOC~~ SOLN
8.0000 [IU] | Freq: Once | SUBCUTANEOUS | Status: AC
  Administered 2019-11-21: 8 [IU] via SUBCUTANEOUS

## 2019-11-21 MED ORDER — DIPHENHYDRAMINE HCL 50 MG/ML IJ SOLN
12.5000 mg | Freq: Once | INTRAMUSCULAR | Status: AC
  Administered 2019-11-21: 12.5 mg via INTRAVENOUS
  Filled 2019-11-21: qty 1

## 2019-11-21 MED ORDER — DICYCLOMINE HCL 10 MG PO CAPS: 10.0000 mg | ORAL_CAPSULE | Freq: Four times a day (QID) | ORAL | 0 refills | Status: DC | PRN

## 2019-11-21 NOTE — Discharge Instructions (Signed)
Continue your continue current medications.  Follow-up with your GI doctor for further evaluation.

## 2019-11-21 NOTE — ED Notes (Signed)
Pt d/c home per MD order. Discharge summery reviewed with pt, pt verbalizes understanding. No s/s of acute distress noted. Ambulatory off unit. Reports mom is discharge ride home.

## 2019-11-21 NOTE — ED Triage Notes (Signed)
Pt to ED via EMS from home c/o seizure. Pt reports she had a seizure this morning, witnessed by her mother, lasting aprox 5 mins. Not post ictal with EMS 140/90, HR 120, 99%. cbg 308. Hx:chf, seizure, anxiety. Takes Lamictal, reports compliant with medication regimen. Pt also complains of nausea and stomach pain, over the past 2 days.  #20 LAC, 50 Fentanyl given by EMS,

## 2019-11-21 NOTE — Progress Notes (Signed)
Inpatient Diabetes Program Recommendations  AACE/ADA: New Consensus Statement on Inpatient Glycemic Control (2015)  Target Ranges:  Prepandial:   less than 140 mg/dL      Peak postprandial:   less than 180 mg/dL (1-2 hours)      Critically ill patients:  140 - 180 mg/dL   Lab Results  Component Value Date   GLUCAP 383 (H) 11/21/2019   HGBA1C 11.8 (H) 08/06/2019    Review of Glycemic Control Results for Nancy [name], Nancy [name] (MRN [id number]) as of 11/21/2019 10:18  Ref. Range 11/21/2019 08:04  Glucose-Capillary Latest Ref Range: 70 - 99 mg/dL 854 (H)   Diabetes history: Type 1 DM (requires basal/bolus) Outpatient Diabetes medications: Novolog 15 units TID, Lantus 70 units QD Current orders for Inpatient glycemic control: Novolog 8 units x 1  Inpatient Diabetes Program Recommendations:    Consider:  -Adding Lantus 15 units BID Novolog 4 units TID (assuming patient is consuming >50% of meals) -Adding A1C as last completed in 07/2019  Thanks, Lujean Rave, MSN, RNC-OB Diabetes Coordinator [phone] (8a-5p)

## 2019-11-21 NOTE — ED Provider Notes (Signed)
[facility] EMERGENCY DEPARTMENT Provider Note   CSN: [id number] Arrival date & time: 11/21/19  7673     History Chief Complaint  Patient presents with  . Seizures  . Abdominal Pain  . Nausea    Nancy [name] is a 19 y.o. female.  HPI   Patient presents to the ED for evaluation of abdominal pain and vomiting.  Patient states the symptoms started a couple days ago.  It has been worsening in severity.  Patient states she has been vomiting multiple times.  She denies any trouble with diarrhea.  No dysuria.  No vaginal discharge.  Pain is diffuse and severe.  Nothing seems to help.  Patient does have a history of chronic recurrent abdominal pain.  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  Patient also states he has been having more seizures than usual.  Patient does have a history of frequent seizures.  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  States she has been compliant with her medications.  Normally she will have several per day.  She does feel like the seizure activity is increasing in frequency.  According to the EMS report the patient had a witnessed seizure by her mother this morning.  It lasted about 5 minutes and there was no postictal period.  Past Medical History:  Diagnosis Date  . Acanthosis nigricans   . Anxiety   . CHF (congestive heart failure) (HCC)   . Chronic lower back pain   . Depression   . DKA, type 1 (HCC) 09/13/2018  . Dyspepsia   . Obesity   . Ovarian cyst    pt is not aware of this hx (11/24/2017)  . 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 Premature baby   . Seizures (HCC)   . Type II diabetes mellitus (HCC)    insulin dependant    Patient Active Problem List   Diagnosis Date Noted  . Cyclical vomiting with nausea 10/01/2019  . Intractable nausea and vomiting 09/30/2019  . Cellulitis   . DKA (diabetic ketoacidoses) 01/27/2019  . Leukocytosis 12/19/2018  . Dehydration     . Pseudoseizures (HCC)   . Type 1 diabetes mellitus with ketoacidosis without coma (HCC) 11/28/2018  . Hyperglycemia due to diabetes mellitus (HCC) 11/27/2018  . 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  . MDD (major depressive disorder), recurrent episode, severe (HCC) 11/06/2018  . Nonspecific abnormal electrocardiogram (ECG) (EKG)   . Chest pain of uncertain etiology   . Hypertensive urgency 10/08/2018  . Conversion disorder with attacks or seizures, acute episode, with psychological stressor 09/16/2018  . MDD (major depressive disorder), recurrent severe, without psychosis (HCC)   . AKI (acute kidney injury) (HCC) 08/04/2018  . Seizure-like activity (HCC) 08/03/2018  . Depression 07/25/2018  . Syncope 01/30/2018  . Orthostatic hypotension 01/24/2018  . Tachycardia 12/28/2017  . Chronic abdominal pain 12/24/2017  . Chest pain 12/19/2017  . Nausea and vomiting 08/21/2017  . Generalized abdominal pain 08/21/2017  . Non compliance with medical treatment 01/27/2012  . Adjustment disorder 09/16/2011  . Acanthosis nigricans   . Goiter   . Obesity 06/14/2010  . Hypertension 06/14/2010    Past Surgical History:  Procedure Laterality Date  . ABDOMINAL HERNIA REPAIR     "I was a baby"  . BIOPSY  10/12/2018   Procedure: BIOPSY;  Surgeon: Lynann Bologna, MD;  Location: [facility] ENDOSCOPY;  Service: Endoscopy;;  . ESOPHAGOGASTRODUODENOSCOPY (EGD) WITH PROPOFOL N/A 10/12/2018  Procedure: ESOPHAGOGASTRODUODENOSCOPY (EGD) WITH PROPOFOL;  Surgeon: Lynann Bologna, MD;  Location: [facility] ENDOSCOPY;  Service: Endoscopy;  Laterality: N/A;  . HERNIA REPAIR    . LEFT HEART CATH AND CORONARY ANGIOGRAPHY N/A 10/13/2018   Procedure: LEFT HEART CATH AND CORONARY ANGIOGRAPHY;  Surgeon: Kathleene Hazel, MD;  Location: [facility] INVASIVE CV LAB;  Service: Cardiovascular;  Laterality: N/A;  . TONSILLECTOMY AND ADENOIDECTOMY    . WISDOM TOOTH EXTRACTION  2017     OB History    No obstetric history on file.     Family History  Problem Relation Age of Onset  . Diabetes Mother   . Hypertension Mother   . Obesity Mother   . Asthma Mother   . Allergic rhinitis Mother   . Eczema Mother   . Cervical cancer Mother   . Diabetes Father   . Hypertension Father   . Obesity Father   . Hyperlipidemia Father   . Hypertension Paternal Aunt   . Hypertension Maternal Grandfather   . Colon cancer Maternal Grandfather   . Diabetes Paternal Grandmother   . Obesity Paternal Grandmother   . Diabetes Paternal Grandfather   . Obesity Paternal Grandfather   . Angioedema Neg Hx   . Immunodeficiency Neg Hx   . Urticaria Neg Hx   . Stomach cancer Neg Hx   . Esophageal cancer Neg Hx     Social History   Tobacco Use  . Smoking status: Never Smoker  . Smokeless tobacco: Never Used  Vaping Use  . Vaping Use: Never used  Substance Use Topics  . Alcohol use: No    Alcohol/week: 0.0 standard drinks  . Drug use: No    Home Medications Prior to Admission medications   Medication Sig Start Date End Date Taking? Authorizing Provider  ARIPiprazole (ABILIFY) 10 MG tablet Take 10 mg by mouth in the morning and at bedtime.    Yes [provider]  atorvastatin (LIPITOR) 20 MG tablet Take 1 tablet (20 mg total) by mouth daily at 6 PM. 10/05/18  Yes Storm Frisk, MD  busPIRone (BUSPAR) 10 MG tablet Take 10 mg by mouth 2 (two) times daily.  11/19/18  Yes [provider]  Cyanocobalamin (VITAMIN B-12 PO) Take 1 tablet by mouth daily. Chew one gummy daily    Yes [provider]  Diclofenac Sodium (VOLTAREN EX) Apply 1 application topically 2 (two) times daily as needed (chest pains).   Yes [provider]  famotidine (PEPCID) 40 MG tablet Take 40 mg by mouth 2 (two) times daily.   Yes [provider]  hydrOXYzine (ATARAX/VISTARIL) 50 MG tablet Take 50 mg by mouth 2 (two) times daily.   Yes [provider]  insulin aspart  (NOVOLOG FLEXPEN) 100 UNIT/ML FlexPen Inject 15 Units into the skin 2 (two) times daily with a meal. Only takes once a day bc she only eats once a day   Yes [provider]  lamoTRIgine (LAMICTAL) 25 MG tablet Take 2 tablets (50 mg total) by mouth 2 (two) times daily. For mood stabilization Patient taking differently: Take 25 mg by mouth in the morning, at noon, and at bedtime. For mood stabilization 11/11/18  Yes Nwoko, Agnes I, NP  LANTUS SOLOSTAR 100 UNIT/ML Solostar Pen Inject 70 Units into the skin at bedtime.  07/05/19  Yes [provider]  lidocaine (LIDODERM) 5 % Place 1 patch onto the skin daily as needed (pain). Remove & Discard patch within 12 hours or as directed by MD  Yes [provider]  metoCLOPramide (REGLAN) 5 MG tablet Take 1 tablet (5 mg total) by mouth 3 (three) times daily. Patient taking differently: Take 5 mg by mouth 3 (three) times daily as needed for nausea.  10/03/19  Yes Jerald Kief, MD  mirtazapine (REMERON) 7.5 MG tablet Take 1 tablet (7.5 mg total) by mouth at bedtime. For depression/sleep 11/11/18  Yes Armandina Stammer I, NP  morphine (MS CONTIN) 60 MG 12 hr tablet Take 60 mg by mouth every 12 (twelve) hours.   Yes [provider]  Multiple Vitamin (MULTIVITAMIN WITH MINERALS) TABS tablet Take 1 tablet by mouth daily. 09/18/18  Yes Clapacs, Jackquline Denmark, MD  Olopatadine HCl (PAZEO) 0.7 % SOLN Place 3 drops into both eyes in the morning and at bedtime.   Yes [provider]  oxycodone (ROXICODONE) 30 MG immediate release tablet Take 30 mg by mouth every 4 (four) hours.   Yes [provider]  pantoprazole (PROTONIX) 40 MG tablet Take 1 tablet (40 mg total) by mouth 2 (two) times daily. For acid reflux Patient taking differently: Take 40 mg by mouth 2 (two) times daily as needed. For acid reflux 01/29/19  Yes Jae Dire, MD  prochlorperazine (COMPAZINE) 25 MG suppository Place 1 suppository (25 mg total) rectally every 12  (twelve) hours as needed for nausea or vomiting. 12/05/18  Yes Melene Plan, DO  propranolol (INDERAL) 20 MG tablet Take 1 tablet (20 mg total) by mouth 2 (two) times daily. For anxiety/HTN 12/23/18  Yes Swayze, Ava, DO  QUEtiapine (SEROQUEL) 100 MG tablet Take 300 mg by mouth at bedtime. 07/05/19  Yes [provider]  QUEtiapine (SEROQUEL) 50 MG tablet Take 50 mg by mouth daily.    Yes [provider]  sucralfate (CARAFATE) 1 GM/10ML suspension Take 1 g by mouth 4 (four) times daily -  with meals and at bedtime.  07/05/19  Yes [provider]  topiramate (TOPAMAX) 25 MG tablet Take 1 tablet (25 mg total) by mouth 2 (two) times daily. For seizure activities 11/11/18  Yes Nwoko, Nicole Kindred I, NP  Caraway Oil-Levomenthol (FDGARD) 25-20.75 MG CAPS Take 2 capsules by mouth as needed. Take as directed as needed Patient not taking: Reported on 10/19/2019 03/21/19   Armbruster, Willaim Rayas, MD  dicyclomine (BENTYL) 10 MG capsule Take 1 capsule (10 mg total) by mouth 4 (four) times daily as needed for spasms. 11/21/19   Linwood Dibbles, MD  lubiprostone (AMITIZA) 24 MCG capsule Take 1 capsule (24 mcg total) by mouth 2 (two) times daily with a meal. Patient not taking: Reported on 10/19/2019 10/27/18   Esterwood, Amy S, PA-C  polyethylene glycol (MIRALAX / GLYCOLAX) 17 g packet Take 17 g by mouth daily. Patient not taking: Reported on 10/19/2019 10/03/19   Jerald Kief, MD    Allergies    Tomato, Ibuprofen, and Oatmeal  Review of Systems   Review of Systems  All other systems reviewed and are negative.   Physical Exam Updated Vital Signs BP 126/81   Pulse (!) 110   Temp 98.2 F (36.8 C) (Oral)   Resp 16   Ht 1.6 m (5\' 3" )   Wt 95.3 kg   LMP 11/06/2019   SpO2 100%   BMI 37.20 kg/m   Physical Exam Vitals and nursing note reviewed.  Constitutional:      Appearance: She is well-developed. She is ill-appearing.  HENT:     Head: Normocephalic and atraumatic.     Right Ear:  External ear normal.     Left Ear: External ear normal.  Eyes:     General: No scleral icterus.       Right eye: No discharge.        Left eye: No discharge.     Conjunctiva/sclera: Conjunctivae normal.  Neck:     Trachea: No tracheal deviation.  Cardiovascular:     Rate and Rhythm: Normal rate and regular rhythm.  Pulmonary:     Effort: Pulmonary effort is normal. No respiratory distress.     Breath sounds: Normal breath sounds. No stridor. No wheezing or rales.  Abdominal:     General: Bowel sounds are normal. There is no distension.     Palpations: Abdomen is soft.     Tenderness: There is generalized abdominal tenderness. There is no guarding or rebound.  Musculoskeletal:        General: No tenderness.     Cervical back: Neck supple.  Skin:    General: Skin is warm and dry.     Findings: No rash.  Neurological:     Mental Status: She is alert and oriented to person, place, and time.     Cranial Nerves: No cranial nerve deficit (no facial droop, extraocular movements intact, no slurred speech).     Sensory: No sensory deficit.     Motor: No abnormal muscle tone or seizure activity.     Coordination: Coordination normal.     ED Results / Procedures / Treatments   Labs (all labs ordered are listed, but only abnormal results are displayed) Labs Reviewed  COMPREHENSIVE METABOLIC PANEL - Abnormal; Notable for the following components:      Result Value   Sodium 132 (*)    Chloride 93 (*)    CO2 19 (*)    Glucose, Bld 411 (*)    Total Bilirubin 1.8 (*)    Anion gap 20 (*)    All other components within normal limits  CBC WITH DIFFERENTIAL/PLATELET - Abnormal; Notable for the following components:   WBC 11.2 (*)    RBC 5.42 (*)    MCV 79.5 (*)    MCH 25.6 (*)    Neutro Abs 8.7 (*)    All other components within normal limits  URINALYSIS, ROUTINE W REFLEX MICROSCOPIC - Abnormal; Notable for the following components:   Color, Urine STRAW (*)    Specific Gravity, Urine  1.035 (*)    Glucose, UA >=500 (*)    Ketones, ur 80 (*)    Bacteria, UA RARE (*)    All other components within normal limits  I-STAT VENOUS BLOOD GAS, ED - Abnormal; Notable for the following components:   pH, Ven 7.467 (*)    pCO2, Ven 36.0 (*)    pO2, Ven 158.0 (*)    Sodium 131 (*)    Calcium, Ion 1.05 (*)    Hemoglobin 15.3 (*)    All other components within normal limits  CBG MONITORING, ED - Abnormal; Notable for the following components:   Glucose-Capillary 383 (*)    All other components within normal limits  LIPASE, BLOOD  I-STAT BETA HCG BLOOD, ED ([facility], [facility], [facility] ONLY)    EKG None  Radiology DG Abd Acute W/Chest  Result Date: 11/21/2019 CLINICAL DATA:  19 year old with abdominal pain and vomiting. EXAM: DG ABDOMEN ACUTE WITH 1 VIEW CHEST COMPARISON:  Chest radiograph 11/13/2019 FINDINGS: Both lungs are clear. Heart size is normal. Negative for free air. Nonobstructive bowel gas pattern. Small to moderate amount  of stool in the abdomen. No large abdominal or pelvic calcifications. Bone structures are unremarkable. IMPRESSION: Negative abdominal radiographs.  No acute cardiopulmonary disease. Electronically Signed   By: Richarda Overlie M.D.   On: 11/21/2019 08:49    Procedures Procedures (including critical care time)  Medications Ordered in ED Medications  sodium chloride 0.9 % bolus 1,000 mL (0 mLs Intravenous Stopped 11/21/19 1000)    And  0.9 %  sodium chloride infusion ( Intravenous New Bag/Given 11/21/19 1019)  famotidine (PEPCID) IVPB 20 mg premix (0 mg Intravenous Stopped 11/21/19 0905)  prochlorperazine (COMPAZINE) injection 10 mg (10 mg Intravenous Given 11/21/19 0805)  diphenhydrAMINE (BENADRYL) injection 12.5 mg (12.5 mg Intravenous Given 11/21/19 0802)  morphine 4 MG/ML injection 4 mg (4 mg Intravenous Given 11/21/19 0804)  insulin aspart (novoLOG) injection 8 Units (8 Units Subcutaneous Given 11/21/19 1016)  haloperidol lactate (HALDOL) injection 1 mg (1 mg  Intravenous Given 11/21/19 1016)    ED Course  I have reviewed the triage vital signs and the nursing notes.  Pertinent labs & imaging results that were available during my care of the patient were reviewed by me and considered in my medical decision making (see chart for details).  Clinical Course as of Nov 21 1326  Mon Nov 21, 2019  3734 Labs reviewed.  Blood sugar elevated at 383.  Venous blood gas does not show any evidence of acidosis   [JK]  0922 X-rays without acute findings   [JK]  0922 LFTs and lipase normal   [JK]  0925 Pt still having some pain.  Would like to try and drink something   [JK]  1136 Sx improving after haldol   [JK]  1322 CT scan from September of this year reviewed.  No acute findings noted   [JK]    Clinical Course User Index [JK] Linwood Dibbles, MD   MDM Rules/Calculators/A&P                         Prior records reviewed.  Patient has had 8 ED visits in the last 6 months.  Previous visits for DKA nausea vomiting, cyclic vomiting as well as seizure-like activity.  Prior records indicate patient has had prior EEGs that did not show seizure-like activity.  Seizure activity:  At mental baseline.  No seizure acitivity in the ED.  Will check labs.    Abd pain:  DDX pancreatitis, obstruction, hepatitis, cholecystitis, cycling vomiting syndrome, gastroparesis, dka.  Will check labs, xrays   Treat symptomatically.  Patient's ED work-up overall is reassuring.  No signs of DKA.  Doubt pancreatitis or cholecystitis.  X-rays did not show any signs of obstruction.  Patient has had recurrent abdominal pain and this seems to be similar to previous bouts.  Do not feel that CT scan imaging is necessary considering chronicity of her symptoms and prior imaging tests.  Regarding patient's seizures no evidence of seizure activity here.  According to the records patient has history of having multiple episodes of daily.  Question whether these are nonepileptic type seizures.   Recommend outpatient follow-up with PCP.  Pharmacy notified me that patient has MS Contin and oxycodone amongst her medications.  She does have pill bottles but they were unable to see the name on the bottle.  The [employer] PDMP website does not show any opiate prescriptions.  Final Clinical Impression(s) / ED Diagnoses Final diagnoses:  Abdominal pain, unspecified abdominal location  Nausea and vomiting, intractability of vomiting not  specified, unspecified vomiting type    Rx / DC Orders ED Discharge Orders         Ordered    dicyclomine (BENTYL) 10 MG capsule  4 times daily PRN        11/21/19 1326           Linwood Dibbles, MD 11/21/19 1329

## 2019-11-21 NOTE — ED Notes (Signed)
Pt transported to xray 

## 2019-11-24 ENCOUNTER — Encounter (HOSPITAL_COMMUNITY): Payer: Self-pay | Admitting: Emergency Medicine

## 2019-11-24 ENCOUNTER — Inpatient Hospital Stay (HOSPITAL_COMMUNITY)
Admission: EM | Admit: 2019-11-24 | Discharge: 2019-11-27 | DRG: 638 | Disposition: A | Discharge status: Home / Self Care | Payer: Medicaid Other | Attending: Internal Medicine | Admitting: Internal Medicine

## 2019-11-24 ENCOUNTER — Emergency Department (HOSPITAL_COMMUNITY): Payer: Medicaid Other

## 2019-11-24 ENCOUNTER — Other Ambulatory Visit: Payer: Self-pay

## 2019-11-24 DIAGNOSIS — Z20822 Contact with and (suspected) exposure to covid-19: Secondary | ICD-10-CM | POA: Diagnosis present

## 2019-11-24 DIAGNOSIS — I10 Essential (primary) hypertension: Secondary | ICD-10-CM | POA: Diagnosis present

## 2019-11-24 DIAGNOSIS — F32A Depression, unspecified: Secondary | ICD-10-CM | POA: Diagnosis present

## 2019-11-24 DIAGNOSIS — F1910 Other psychoactive substance abuse, uncomplicated: Secondary | ICD-10-CM

## 2019-11-24 DIAGNOSIS — R569 Unspecified convulsions: Secondary | ICD-10-CM

## 2019-11-24 DIAGNOSIS — Z794 Long term (current) use of insulin: Secondary | ICD-10-CM

## 2019-11-24 DIAGNOSIS — E1010 Type 1 diabetes mellitus with ketoacidosis without coma: Principal | ICD-10-CM | POA: Diagnosis present

## 2019-11-24 DIAGNOSIS — F449 Dissociative and conversion disorder, unspecified: Secondary | ICD-10-CM | POA: Diagnosis present

## 2019-11-24 DIAGNOSIS — E1043 Type 1 diabetes mellitus with diabetic autonomic (poly)neuropathy: Secondary | ICD-10-CM | POA: Diagnosis present

## 2019-11-24 DIAGNOSIS — Z91199 Patient's noncompliance with other medical treatment and regimen due to unspecified reason: Secondary | ICD-10-CM

## 2019-11-24 DIAGNOSIS — F419 Anxiety disorder, unspecified: Secondary | ICD-10-CM | POA: Diagnosis present

## 2019-11-24 DIAGNOSIS — I509 Heart failure, unspecified: Secondary | ICD-10-CM | POA: Diagnosis present

## 2019-11-24 DIAGNOSIS — G40909 Epilepsy, unspecified, not intractable, without status epilepticus: Secondary | ICD-10-CM | POA: Diagnosis present

## 2019-11-24 DIAGNOSIS — K2970 Gastritis, unspecified, without bleeding: Secondary | ICD-10-CM | POA: Diagnosis present

## 2019-11-24 DIAGNOSIS — E876 Hypokalemia: Secondary | ICD-10-CM | POA: Diagnosis present

## 2019-11-24 DIAGNOSIS — F332 Major depressive disorder, recurrent severe without psychotic features: Secondary | ICD-10-CM | POA: Diagnosis present

## 2019-11-24 DIAGNOSIS — K3184 Gastroparesis: Secondary | ICD-10-CM | POA: Diagnosis present

## 2019-11-24 DIAGNOSIS — M545 Low back pain, unspecified: Secondary | ICD-10-CM | POA: Diagnosis present

## 2019-11-24 DIAGNOSIS — Z886 Allergy status to analgesic agent status: Secondary | ICD-10-CM

## 2019-11-24 DIAGNOSIS — E860 Dehydration: Secondary | ICD-10-CM | POA: Diagnosis present

## 2019-11-24 DIAGNOSIS — E785 Hyperlipidemia, unspecified: Secondary | ICD-10-CM | POA: Diagnosis present

## 2019-11-24 DIAGNOSIS — R1110 Vomiting, unspecified: Secondary | ICD-10-CM

## 2019-11-24 DIAGNOSIS — Z91018 Allergy to other foods: Secondary | ICD-10-CM

## 2019-11-24 DIAGNOSIS — F445 Conversion disorder with seizures or convulsions: Secondary | ICD-10-CM

## 2019-11-24 DIAGNOSIS — G8929 Other chronic pain: Secondary | ICD-10-CM | POA: Diagnosis present

## 2019-11-24 DIAGNOSIS — Z9119 Patient's noncompliance with other medical treatment and regimen: Secondary | ICD-10-CM

## 2019-11-24 DIAGNOSIS — E1143 Type 2 diabetes mellitus with diabetic autonomic (poly)neuropathy: Secondary | ICD-10-CM

## 2019-11-24 DIAGNOSIS — Z833 Family history of diabetes mellitus: Secondary | ICD-10-CM

## 2019-11-24 DIAGNOSIS — I110 Hypertensive heart disease with heart failure: Secondary | ICD-10-CM | POA: Diagnosis present

## 2019-11-24 DIAGNOSIS — I152 Hypertension secondary to endocrine disorders: Secondary | ICD-10-CM | POA: Diagnosis present

## 2019-11-24 DIAGNOSIS — Z87892 Personal history of anaphylaxis: Secondary | ICD-10-CM

## 2019-11-24 LAB — RAPID URINE DRUG SCREEN, HOSP PERFORMED
Amphetamines: NOT DETECTED
Barbiturates: NOT DETECTED
Benzodiazepines: NOT DETECTED
Cocaine: NOT DETECTED
Opiates: POSITIVE — AB
Tetrahydrocannabinol: POSITIVE — AB

## 2019-11-24 LAB — I-STAT VENOUS BLOOD GAS, ED
Acid-Base Excess: 3 mmol/L — ABNORMAL HIGH (ref 0.0–2.0)
Bicarbonate: 22.6 mmol/L (ref 20.0–28.0)
Calcium, Ion: 1.12 mmol/L — ABNORMAL LOW (ref 1.15–1.40)
HCT: 43 % (ref 36.0–46.0)
Hemoglobin: 14.6 g/dL (ref 12.0–15.0)
O2 Saturation: 100 %
Potassium: 4 mmol/L (ref 3.5–5.1)
Sodium: 137 mmol/L (ref 135–145)
TCO2: 23 mmol/L (ref 22–32)
pCO2, Ven: 23.4 mmHg — ABNORMAL LOW (ref 44.0–60.0)
pH, Ven: 7.591 — ABNORMAL HIGH (ref 7.250–7.430)
pO2, Ven: 188 mmHg — ABNORMAL HIGH (ref 32.0–45.0)

## 2019-11-24 LAB — MAGNESIUM: Magnesium: 1.8 mg/dL (ref 1.7–2.4)

## 2019-11-24 LAB — CBC
HCT: 42.2 % (ref 36.0–46.0)
Hemoglobin: 13.6 g/dL (ref 12.0–15.0)
MCH: 25.3 pg — ABNORMAL LOW (ref 26.0–34.0)
MCHC: 32.2 g/dL (ref 30.0–36.0)
MCV: 78.6 fL — ABNORMAL LOW (ref 80.0–100.0)
Platelets: 393 10*3/uL (ref 150–400)
RBC: 5.37 MIL/uL — ABNORMAL HIGH (ref 3.87–5.11)
RDW: 15.1 % (ref 11.5–15.5)
WBC: 9.1 10*3/uL (ref 4.0–10.5)
nRBC: 0 % (ref 0.0–0.2)

## 2019-11-24 LAB — HEPATIC FUNCTION PANEL
ALT: 13 U/L (ref 0–44)
AST: 12 U/L — ABNORMAL LOW (ref 15–41)
Albumin: 3.8 g/dL (ref 3.5–5.0)
Alkaline Phosphatase: 80 U/L (ref 38–126)
Bilirubin, Direct: 0.1 mg/dL (ref 0.0–0.2)
Indirect Bilirubin: 0.6 mg/dL (ref 0.3–0.9)
Total Bilirubin: 0.7 mg/dL (ref 0.3–1.2)
Total Protein: 6.6 g/dL (ref 6.5–8.1)

## 2019-11-24 LAB — BASIC METABOLIC PANEL
Anion gap: 18 — ABNORMAL HIGH (ref 5–15)
BUN: 7 mg/dL (ref 6–20)
CO2: 18 mmol/L — ABNORMAL LOW (ref 22–32)
Calcium: 10.1 mg/dL (ref 8.9–10.3)
Chloride: 101 mmol/L (ref 98–111)
Creatinine, Ser: 0.73 mg/dL (ref 0.44–1.00)
GFR, Estimated: >60 mL/min (ref >60)
Glucose, Bld: 395 mg/dL — ABNORMAL HIGH (ref 70–99)
Potassium: 3.7 mmol/L (ref 3.5–5.1)
Sodium: 137 mmol/L (ref 135–145)

## 2019-11-24 LAB — I-STAT BETA HCG BLOOD, ED (MC, WL, AP ONLY): I-stat hCG, quantitative: <5 m[IU]/mL (ref <5)

## 2019-11-24 LAB — LIPASE, BLOOD: Lipase: 25 U/L (ref 11–51)

## 2019-11-24 LAB — I-STAT CHEM 8, ED
BUN: 7 mg/dL (ref 6–20)
Calcium, Ion: 1.12 mmol/L — ABNORMAL LOW (ref 1.15–1.40)
Chloride: 103 mmol/L (ref 98–111)
Creatinine, Ser: 0.4 mg/dL — ABNORMAL LOW (ref 0.44–1.00)
Glucose, Bld: 390 mg/dL — ABNORMAL HIGH (ref 70–99)
HCT: 44 % (ref 36.0–46.0)
Hemoglobin: 15 g/dL (ref 12.0–15.0)
Potassium: 4 mmol/L (ref 3.5–5.1)
Sodium: 138 mmol/L (ref 135–145)
TCO2: 21 mmol/L — ABNORMAL LOW (ref 22–32)

## 2019-11-24 LAB — CBG MONITORING, ED
Glucose-Capillary: 420 mg/dL — ABNORMAL HIGH (ref 70–99)
Glucose-Capillary: 281 mg/dL — ABNORMAL HIGH (ref 70–99)
Glucose-Capillary: 233 mg/dL — ABNORMAL HIGH (ref 70–99)

## 2019-11-24 LAB — TROPONIN I (HIGH SENSITIVITY)
Troponin I (High Sensitivity): <2 ng/L (ref <18)
Troponin I (High Sensitivity): <2 ng/L (ref <18)

## 2019-11-24 LAB — BETA-HYDROXYBUTYRIC ACID: Beta-Hydroxybutyric Acid: 2.55 mmol/L — ABNORMAL HIGH (ref 0.05–0.27)

## 2019-11-24 LAB — ETHANOL: Alcohol, Ethyl (B): <10 mg/dL (ref <10)

## 2019-11-24 MED ORDER — PROMETHAZINE HCL 25 MG/ML IJ SOLN
12.5000 mg | Freq: Once | INTRAMUSCULAR | Status: AC
  Administered 2019-11-24: 12.5 mg via INTRAVENOUS
  Filled 2019-11-24: qty 1

## 2019-11-24 MED ORDER — DICYCLOMINE HCL 10 MG PO CAPS
10.0000 mg | ORAL_CAPSULE | Freq: Four times a day (QID) | ORAL | Status: DC | PRN
  Filled 2019-11-24: qty 1

## 2019-11-24 MED ORDER — METOPROLOL TARTRATE 5 MG/5ML IV SOLN: 2.5000 mg | Freq: Four times a day (QID) | INTRAVENOUS | Status: DC | PRN

## 2019-11-24 MED ORDER — ARIPIPRAZOLE 10 MG PO TABS
10.0000 mg | ORAL_TABLET | Freq: Every day | ORAL | Status: DC
  Administered 2019-11-25 – 2019-11-27 (×3): 10 mg via ORAL
  Filled 2019-11-24 (×3): qty 1

## 2019-11-24 MED ORDER — FAMOTIDINE 20 MG PO TABS
40.0000 mg | ORAL_TABLET | Freq: Two times a day (BID) | ORAL | Status: DC
  Administered 2019-11-24 – 2019-11-27 (×6): 40 mg via ORAL
  Filled 2019-11-24 (×6): qty 2

## 2019-11-24 MED ORDER — SODIUM CHLORIDE 0.9 % IV BOLUS
1000.0000 mL | Freq: Once | INTRAVENOUS | Status: AC
  Administered 2019-11-24: 1000 mL via INTRAVENOUS

## 2019-11-24 MED ORDER — LAMOTRIGINE 25 MG PO TABS
25.0000 mg | ORAL_TABLET | Freq: Two times a day (BID) | ORAL | Status: DC
  Administered 2019-11-24 – 2019-11-27 (×6): 25 mg via ORAL
  Filled 2019-11-24 (×6): qty 1

## 2019-11-24 MED ORDER — LACTATED RINGERS IV SOLN: INTRAVENOUS | Status: DC

## 2019-11-24 MED ORDER — LORAZEPAM 2 MG/ML IJ SOLN
INTRAMUSCULAR | Status: AC
  Administered 2019-11-24: 1 mg via INTRAVENOUS
  Filled 2019-11-24: qty 1

## 2019-11-24 MED ORDER — BUSPIRONE HCL 10 MG PO TABS
10.0000 mg | ORAL_TABLET | Freq: Two times a day (BID) | ORAL | Status: DC
  Administered 2019-11-24 – 2019-11-27 (×6): 10 mg via ORAL
  Filled 2019-11-24 (×6): qty 1

## 2019-11-24 MED ORDER — SUCRALFATE 1 GM/10ML PO SUSP
1.0000 g | Freq: Three times a day (TID) | ORAL | Status: DC
  Administered 2019-11-24 – 2019-11-27 (×10): 1 g via ORAL
  Filled 2019-11-24 (×12): qty 10

## 2019-11-24 MED ORDER — HYDROXYZINE HCL 25 MG PO TABS
50.0000 mg | ORAL_TABLET | Freq: Two times a day (BID) | ORAL | Status: DC
  Administered 2019-11-24 – 2019-11-27 (×6): 50 mg via ORAL
  Filled 2019-11-24 (×6): qty 2

## 2019-11-24 MED ORDER — PANTOPRAZOLE SODIUM 40 MG IV SOLR
40.0000 mg | Freq: Once | INTRAVENOUS | Status: AC
  Administered 2019-11-24: 40 mg via INTRAVENOUS
  Filled 2019-11-24: qty 40

## 2019-11-24 MED ORDER — VITAMIN B-12 1000 MCG PO TABS
500.0000 ug | ORAL_TABLET | Freq: Every day | ORAL | Status: DC
  Administered 2019-11-25 – 2019-11-27 (×3): 500 ug via ORAL
  Filled 2019-11-24 (×3): qty 1

## 2019-11-24 MED ORDER — LORAZEPAM 2 MG/ML IJ SOLN: 1.0000 mg | Freq: Once | INTRAMUSCULAR | Status: DC

## 2019-11-24 MED ORDER — PROPRANOLOL HCL 40 MG PO TABS
20.0000 mg | ORAL_TABLET | Freq: Two times a day (BID) | ORAL | Status: DC
  Administered 2019-11-24 – 2019-11-27 (×6): 20 mg via ORAL
  Filled 2019-11-24 (×6): qty 1

## 2019-11-24 MED ORDER — HALOPERIDOL LACTATE 5 MG/ML IJ SOLN
5.0000 mg | Freq: Once | INTRAMUSCULAR | Status: AC
  Administered 2019-11-24: 5 mg via INTRAVENOUS
  Filled 2019-11-24: qty 1

## 2019-11-24 MED ORDER — DEXTROSE 50 % IV SOLN: 0.0000 mL | INTRAVENOUS | Status: DC | PRN

## 2019-11-24 MED ORDER — INSULIN REGULAR(HUMAN) IN NACL 100-0.9 UT/100ML-% IV SOLN
INTRAVENOUS | Status: DC
  Administered 2019-11-24: 11.5 [IU]/h via INTRAVENOUS
  Filled 2019-11-24: qty 100

## 2019-11-24 MED ORDER — DEXTROSE IN LACTATED RINGERS 5 % IV SOLN: INTRAVENOUS | Status: DC

## 2019-11-24 MED ORDER — LORAZEPAM 2 MG/ML IJ SOLN: 1.0000 mg | Freq: Once | INTRAMUSCULAR | Status: AC

## 2019-11-24 MED ORDER — ATORVASTATIN CALCIUM 10 MG PO TABS
20.0000 mg | ORAL_TABLET | Freq: Every day | ORAL | Status: DC
  Administered 2019-11-25 – 2019-11-26 (×2): 20 mg via ORAL
  Filled 2019-11-24 (×2): qty 2

## 2019-11-24 NOTE — ED Notes (Signed)
No response from pt in waiting room. Called x2.

## 2019-11-24 NOTE — H&P (Addendum)
History and Physical  Nancy [name] LNL:[id number] DOB: 2000-04-02 DOA: 11/24/2019  Referring physician: Jeraldine Loots, PA PCP: Fleet Contras, MD  Outpatient Specialists: GI, Dr. Adela Lank Patient coming from: Home.  Chief Complaint: Nausea and vomiting, unable to keep anything down.  HPI: Nancy [name] is a 19 y.o. female with medical history significant for type 1 diabetes, DKA, mild Candida esophagitis and gastritis seen on EGD done on 10/12/2018, polysubstance abuse including THC, gastroparesis, seizure disorder, conversion disorder, GERD, essential hypertension, hyperlipidemia, chronic anxiety/depression, who presented to [facility] due to nausea vomiting unable to keep any oral intake x 4 days.  Associated with epigastric burning discomfort and diffuse abdominal pain.  Denies any fever.  Admits to chills in the last 24 hours.  No diarrhea.  +Polydipsia and polyuria.  Per EDP, she has presented multiple times to the ED for the same in the last 2 months.  Usually symptoms would get better with Haldol.  This time, her nausea and vomiting persisted in the ED.  She received IV Haldol, IV Ativan and IV Protonix with no significant improvement.  While in the x-ray patient had an episode of seizure-like activity.  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  Patient is not postictal.  Work-up in the ED revealed mild DKA type I with beta hydroxybutyric acid 2.55, serum glucose 398, serum bicarb of 18 anion gap of 18 with VBG pH of 7.5.  She was started on DKA protocol, IV fluids and IV insulin.  TRH, hospitalist team, asked to admit.  ED Course: Uncontrolled hypertension BP 149/133 with tachycardia heart rate 145 and tachypnea 51.  These numbers improved with the above interventions.  Abdominal x-ray negative for any acute findings.  Review of Systems: Review of systems as noted in the HPI. All other systems reviewed and are negative.   Past  Medical History:  Diagnosis Date  . Acanthosis nigricans   . Anxiety   . CHF (congestive heart failure) (HCC)   . Chronic lower back pain   . Depression   . DKA, type 1 (HCC) 09/13/2018  . Dyspepsia   . Obesity   . Ovarian cyst    pt is not aware of this hx (11/24/2017)  . 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 Premature baby   . Seizures (HCC)   . Type II diabetes mellitus (HCC)    insulin dependant   Past Surgical History:  Procedure Laterality Date  . ABDOMINAL HERNIA REPAIR     "I was a baby"  . BIOPSY  10/12/2018   Procedure: BIOPSY;  Surgeon: Lynann Bologna, MD;  Location: [facility] ENDOSCOPY;  Service: Endoscopy;;  . ESOPHAGOGASTRODUODENOSCOPY (EGD) WITH PROPOFOL N/A 10/12/2018   Procedure: ESOPHAGOGASTRODUODENOSCOPY (EGD) WITH PROPOFOL;  Surgeon: Lynann Bologna, MD;  Location: [facility] ENDOSCOPY;  Service: Endoscopy;  Laterality: N/A;  . HERNIA REPAIR    . LEFT HEART CATH AND CORONARY ANGIOGRAPHY N/A 10/13/2018   Procedure: LEFT HEART CATH AND CORONARY ANGIOGRAPHY;  Surgeon: Kathleene Hazel, MD;  Location: [facility] INVASIVE CV LAB;  Service: Cardiovascular;  Laterality: N/A;  . TONSILLECTOMY AND ADENOIDECTOMY    . WISDOM TOOTH EXTRACTION  2017    Social History:  reports that she has never smoked. She has never used smokeless tobacco. She reports that she does not drink alcohol and does not use drugs.   Allergies  Allergen Reactions  . Tomato Anaphylaxis  . Ibuprofen Other (See Comments)    GI MD said to not take this  anymore  . Oatmeal Rash    Family History  Problem Relation Age of Onset  . Diabetes Mother   . Hypertension Mother   . Obesity Mother   . Asthma Mother   . Allergic rhinitis Mother   . Eczema Mother   . Cervical cancer Mother   . Diabetes Father   . Hypertension Father   . Obesity Father   . Hyperlipidemia Father   . Hypertension Paternal Aunt   . Hypertension Maternal Grandfather   . Colon cancer Maternal Grandfather   . Diabetes Paternal Grandmother   .  Obesity Paternal Grandmother   . Diabetes Paternal Grandfather   . Obesity Paternal Grandfather   . Angioedema Neg Hx   . Immunodeficiency Neg Hx   . Urticaria Neg Hx   . Stomach cancer Neg Hx   . Esophageal cancer Neg Hx       Prior to Admission medications   Medication Sig Start Date End Date Taking? Authorizing Provider  ARIPiprazole (ABILIFY) 10 MG tablet Take 10 mg by mouth in the morning and at bedtime.     [provider]  atorvastatin (LIPITOR) 20 MG tablet Take 1 tablet (20 mg total) by mouth daily at 6 PM. 10/05/18   Storm Frisk, MD  busPIRone (BUSPAR) 10 MG tablet Take 10 mg by mouth 2 (two) times daily.  11/19/18   [provider]  Caraway Oil-Levomenthol (FDGARD) 25-20.75 MG CAPS Take 2 capsules by mouth as needed. Take as directed as needed Patient not taking: Reported on 10/19/2019 03/21/19   Benancio Deeds, MD  Cyanocobalamin (VITAMIN B-12 PO) Take 1 tablet by mouth daily. Chew one gummy daily     [provider]  Diclofenac Sodium (VOLTAREN EX) Apply 1 application topically 2 (two) times daily as needed (chest pains).    [provider]  dicyclomine (BENTYL) 10 MG capsule Take 1 capsule (10 mg total) by mouth 4 (four) times daily as needed for spasms. 11/21/19   Linwood Dibbles, MD  famotidine (PEPCID) 40 MG tablet Take 40 mg by mouth 2 (two) times daily.    [provider]  hydrOXYzine (ATARAX/VISTARIL) 50 MG tablet Take 50 mg by mouth 2 (two) times daily.    [provider]  insulin aspart (NOVOLOG FLEXPEN) 100 UNIT/ML FlexPen Inject 15 Units into the skin 2 (two) times daily with a meal. Only takes once a day bc she only eats once a day    [provider]  lamoTRIgine (LAMICTAL) 25 MG tablet Take 2 tablets (50 mg total) by mouth 2 (two) times daily. For mood stabilization Patient taking differently: Take 25 mg by mouth in the morning, at noon, and at bedtime. For mood stabilization 11/11/18   Nwoko, Nicole Kindred  I, NP  LANTUS SOLOSTAR 100 UNIT/ML Solostar Pen Inject 70 Units into the skin at bedtime.  07/05/19   [provider]  lidocaine (LIDODERM) 5 % Place 1 patch onto the skin daily as needed (pain). Remove & Discard patch within 12 hours or as directed by MD    [provider]  lubiprostone (AMITIZA) 24 MCG capsule Take 1 capsule (24 mcg total) by mouth 2 (two) times daily with a meal. Patient not taking: Reported on 10/19/2019 10/27/18   Esterwood, Amy S, PA-C  metoCLOPramide (REGLAN) 5 MG tablet Take 1 tablet (5 mg total) by mouth 3 (three) times daily. Patient taking differently: Take 5 mg by mouth 3 (three) times daily as needed for nausea.  10/03/19  Jerald Kief, MD  mirtazapine (REMERON) 7.5 MG tablet Take 1 tablet (7.5 mg total) by mouth at bedtime. For depression/sleep 11/11/18   Armandina Stammer I, NP  morphine (MS CONTIN) 60 MG 12 hr tablet Take 60 mg by mouth every 12 (twelve) hours.    [provider]  Multiple Vitamin (MULTIVITAMIN WITH MINERALS) TABS tablet Take 1 tablet by mouth daily. 09/18/18   Clapacs, Jackquline Denmark, MD  Olopatadine HCl (PAZEO) 0.7 % SOLN Place 3 drops into both eyes in the morning and at bedtime.    [provider]  oxycodone (ROXICODONE) 30 MG immediate release tablet Take 30 mg by mouth every 4 (four) hours.    [provider]  pantoprazole (PROTONIX) 40 MG tablet Take 1 tablet (40 mg total) by mouth 2 (two) times daily. For acid reflux Patient taking differently: Take 40 mg by mouth 2 (two) times daily as needed. For acid reflux 01/29/19   Jae Dire, MD  polyethylene glycol (MIRALAX / GLYCOLAX) 17 g packet Take 17 g by mouth daily. Patient not taking: Reported on 10/19/2019 10/03/19   Jerald Kief, MD  prochlorperazine (COMPAZINE) 25 MG suppository Place 1 suppository (25 mg total) rectally every 12 (twelve) hours as needed for nausea or vomiting. 12/05/18   Melene Plan, DO  propranolol (INDERAL) 20 MG tablet Take 1 tablet  (20 mg total) by mouth 2 (two) times daily. For anxiety/HTN 12/23/18   Swayze, Ava, DO  QUEtiapine (SEROQUEL) 100 MG tablet Take 300 mg by mouth at bedtime. 07/05/19   [provider]  QUEtiapine (SEROQUEL) 50 MG tablet Take 50 mg by mouth daily.     [provider]  sucralfate (CARAFATE) 1 GM/10ML suspension Take 1 g by mouth 4 (four) times daily -  with meals and at bedtime.  07/05/19   [provider]  topiramate (TOPAMAX) 25 MG tablet Take 1 tablet (25 mg total) by mouth 2 (two) times daily. For seizure activities 11/11/18   Armandina Stammer I, NP    Physical Exam: BP 127/62   Pulse (!) 106   Temp 98.2 F (36.8 C) (Oral)   Resp (!) 24   Ht  (1.6 m)   Wt 95.3 kg   LMP 11/06/2019   SpO2 100%   BMI 37.20 kg/m   . General: 19 y.o. year-old female well developed well nourished in no acute distress.  Alert and oriented x3. . Cardiovascular: Regular rate and rhythm with no rubs or gallops.  No thyromegaly or JVD noted.  No lower extremity edema. 2/4 pulses in all 4 extremities. Marland Kitchen Respiratory: Clear to auscultation with no wheezes or rales. Good inspiratory effort. . Abdomen: Soft diffuse tenderness with palpation, worse in the epigastric region. Bowel sounds present.  . Muskuloskeletal: No cyanosis, clubbing or edema noted bilaterally . Neuro: CN II-XII intact, strength, sensation, reflexes . Skin: Skin lesion left cheek.  Marland Kitchen Psychiatry: Judgement and insight appear normal. Mood is appropriate for condition and setting          Labs on Admission:  Basic Metabolic Panel: Recent Labs  Lab 11/21/19 0737 11/21/19 0802 11/24/19 1515 11/24/19 1634 11/24/19 1709 11/24/19 1800  NA 132* 131* 137 138 137  --   K 5.0 5.0 3.7 4.0 4.0  --   CL 93*  --  101 103  --   --   CO2 19*  --  18*  --   --   --   GLUCOSE 411*  --  395* 390*  --   --   BUN 10  --  7 7  --   --   CREATININE 0.80  --  0.73 0.40*  --   --   CALCIUM 9.4  --  10.1  --   --   --   MG  --   --    --   --   --  1.8   Liver Function Tests: Recent Labs  Lab 11/21/19 0737 11/24/19 1800  AST 24 12*  ALT 13 13  ALKPHOS 85 80  BILITOT 1.8* 0.7  PROT 7.4 6.6  ALBUMIN 4.2 3.8   Recent Labs  Lab 11/21/19 0737 11/24/19 1800  LIPASE 24 25   No results for input(s): AMMONIA in the last 168 hours. CBC: Recent Labs  Lab 11/21/19 0737 11/21/19 0802 11/24/19 1515 11/24/19 1634 11/24/19 1709  WBC 11.2*  --  9.1  --   --   NEUTROABS 8.7*  --   --   --   --   HGB 13.9 15.3* 13.6 15.0 14.6  HCT 43.1 45.0 42.2 44.0 43.0  MCV 79.5*  --  78.6*  --   --   PLT 350  --  393  --   --    Cardiac Enzymes: No results for input(s): CKTOTAL, CKMB, CKMBINDEX, TROPONINI in the last 168 hours.  BNP (last 3 results) No results for input(s): BNP in the last 8760 hours.  ProBNP (last 3 results) No results for input(s): PROBNP in the last 8760 hours.  CBG: Recent Labs  Lab 11/21/19 0804 11/24/19 1627  GLUCAP 383* 420*    Radiological Exams on Admission: DG Chest Portable 1 View  Result Date: 11/24/2019 CLINICAL DATA:  19 year old female with abdominal pain and seizure. EXAM: PORTABLE CHEST 1 VIEW COMPARISON:  Chest radiograph dated 11/13/2019. FINDINGS: The heart size and mediastinal contours are within normal limits. Both lungs are clear. The visualized skeletal structures are unremarkable. IMPRESSION: No active disease. Electronically Signed   By: Elgie Collard M.D.   On: 11/24/2019 17:28   DG Abd 2 Views  Result Date: 11/24/2019 CLINICAL DATA:  Intractable vomiting EXAM: ABDOMEN - 2 VIEW COMPARISON:  None. FINDINGS: The bowel gas pattern is normal. There is no evidence of free air. No radio-opaque calculi or other significant radiographic abnormality is seen. IMPRESSION: Negative. Electronically Signed   By: Charlett Nose M.D.   On: 11/24/2019 20:10    EKG: I independently viewed the EKG done and my findings are as followed: Sinus tachycardia rate of 104 nonspecific ST-T  changes.  Assessment/Plan Present on Admission: . DKA, type 1 (HCC)  Active Problems:   DKA, type 1 (HCC)  Mild DKA, type I, suspect noncompliance Presented with beta hydroxybutyrate acid 2.55, serum glucose 398, serum bicarb of 18 anion gap of 18 with VBG pH of 7.5.  She was started on DKA protocol, IV fluids and IV insulin.  N.p.o. except for ice chips, sips with meds. Obtain hemoglobin A1c Repeat BMP every 4 hours Transition to subcu insulin once anion gap is closed and acidosis has resolved. Continue IV fluids  Intractable nausea and vomiting in the setting of DKA type I Possibly a component of gastroparesis and sequelae of THC use IV antiemetics as needed IV fluids hydration Lipase level and LFTs were normal.  Diffuse abdominal pain, worse at epigastric region Abdominal x-ray unrevealing Lipase level normal She follows with GI She has a history of mild candida esophagitis and gastritis, this was  seen on EGD done in 2020. She states she has a follow-up appointment with GI in December 2021 If no improvement in the morning consider consulting GI Avoid opiates  Mild anion gap metabolic acidosis in the setting of DKA type I Presented with serum bicarb 18 anion gap of 18 Continue aggressive IV fluid Repeat BMP to monitor serum bicarb and anion gap.  Essential hypertension BP is not at goal Resume home oral antihypertensives Add IV antihypertensive with parameters Continue to monitor vital signs  Sinus tachycardia likely secondary to dehydration from polyuria and DKA Last TSH was 0.098 on 10/01/2019 Last free T4 1.18 on 08/09/2019. Repeat TSH and free T4 once acute illness has resolved. Continue to monitor on telemetry Continue aggressive IV fluid hydration  History of gastroparesis Resume home regimen  Chronic anxiety/depression Resume home regimen  Seizure disorder/conversion disorder Non postictal Resume home regimen Monitor  Hyperlipidemia Resume home  regimen      DVT prophylaxis: Subcu Lovenox daily  Code Status: Full code as per patient herself.  Family Communication: None at bedside. Patient declined that I call her mother.  Disposition Plan: Admit to progressive unit for IV insulin.  Consults called: None.  Admission status: Observation status.   Status is: Observation    Dispo:  Patient From: Home  Planned Disposition: Home  Expected discharge date: 11/25/19  Medically stable for discharge: No, ongoing management of DKA type I, intractable nausea vomiting and abdominal pain.   Darlin Drop MD [facility] Pager [phone]  If 7PM-7AM, please contact night-coverage [email or web address] Password [email or web address]  11/24/2019, 8:59 PM

## 2019-11-24 NOTE — ED Notes (Signed)
Called pt x2 in [place]. No response.

## 2019-11-24 NOTE — ED Notes (Signed)
Ok to run insulin with LR per pharmacy.

## 2019-11-24 NOTE — ED Triage Notes (Signed)
Pt arrives via EMS with reports of N/V for 3 days. Endorses abd and CP. Pt anxious in triage.

## 2019-11-24 NOTE — ED Provider Notes (Signed)
[facility] EMERGENCY DEPARTMENT Provider Note   CSN: [id number] Arrival date & time: 11/24/19  1459     History Chief Complaint  Patient presents with  . Emesis    Nancy [name] is a 19 y.o. female with a past medical history of DM 1, seizures and pseudoseizures, depression, conversion disorder, hypertension, chronic abdominal pain, who presents today for evaluation of abdominal and chest pain.  Level 5 caveat for altered mental status, patient acuity of condition.  Patient reportedly has had nausea and vomiting for 3 days with pain in her abdomen and chest.  Patient reports that this is her usual abdominal pain however is much more severe than usual.  She states the last time it was this severe was many months ago.    Patient was seen 3 days ago, at that point she noted worsening severity of abdominal pain and vomiting, no diarrhea, in addition to frequent seizures with increasing frequency.  HPI     Past Medical History:  Diagnosis Date  . Acanthosis nigricans   . Anxiety   . CHF (congestive heart failure) (HCC)   . Chronic lower back pain   . Depression   . DKA, type 1 (HCC) 09/13/2018  . Dyspepsia   . Obesity   . Ovarian cyst    pt is not aware of this hx (11/24/2017)  . 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 Premature baby   . Seizures (HCC)   . Type II diabetes mellitus (HCC)    insulin dependant    Patient Active Problem List   Diagnosis Date Noted  . DKA, type 1 (HCC) 11/24/2019  . Cyclical vomiting with nausea 10/01/2019  . Intractable nausea and vomiting 09/30/2019  . Cellulitis   . DKA (diabetic ketoacidoses) 01/27/2019  . Leukocytosis 12/19/2018  . Dehydration   . Pseudoseizures (HCC)   . Type 1 diabetes mellitus with ketoacidosis without coma (HCC) 11/28/2018  . Hyperglycemia due to diabetes mellitus (HCC) 11/27/2018  . 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  . MDD (major depressive disorder),  recurrent episode, severe (HCC) 11/06/2018  . Nonspecific abnormal electrocardiogram (ECG) (EKG)   . Chest pain of uncertain etiology   . Hypertensive urgency 10/08/2018  . Conversion disorder with attacks or seizures, acute episode, with psychological stressor 09/16/2018  . MDD (major depressive disorder), recurrent severe, without psychosis (HCC)   . AKI (acute kidney injury) (HCC) 08/04/2018  . Seizure-like activity (HCC) 08/03/2018  . Depression 07/25/2018  . Syncope 01/30/2018  . Orthostatic hypotension 01/24/2018  . Tachycardia 12/28/2017  . Chronic abdominal pain 12/24/2017  . Chest pain 12/19/2017  . Nausea and vomiting 08/21/2017  . Generalized abdominal pain 08/21/2017  . Non compliance with medical treatment 01/27/2012  . Adjustment disorder 09/16/2011  . Acanthosis nigricans   . Goiter   . Obesity 06/14/2010  . Hypertension 06/14/2010    Past Surgical History:  Procedure Laterality Date  . ABDOMINAL HERNIA REPAIR     "I was a baby"  . BIOPSY  10/12/2018   Procedure: BIOPSY;  Surgeon: Lynann Bologna, MD;  Location: [facility] ENDOSCOPY;  Service: Endoscopy;;  . ESOPHAGOGASTRODUODENOSCOPY (EGD) WITH PROPOFOL N/A 10/12/2018   Procedure: ESOPHAGOGASTRODUODENOSCOPY (EGD) WITH PROPOFOL;  Surgeon: Lynann Bologna, MD;  Location: [facility] ENDOSCOPY;  Service: Endoscopy;  Laterality: N/A;  . HERNIA REPAIR    . LEFT HEART CATH AND CORONARY ANGIOGRAPHY N/A 10/13/2018   Procedure: LEFT HEART CATH AND CORONARY ANGIOGRAPHY;  Surgeon: Kathleene Hazel, MD;  Location:  [facility] INVASIVE CV LAB;  Service: Cardiovascular;  Laterality: N/A;  . TONSILLECTOMY AND ADENOIDECTOMY    . WISDOM TOOTH EXTRACTION  2017     OB History   No obstetric history on file.     Family History  Problem Relation Age of Onset  . Diabetes Mother   . Hypertension Mother   . Obesity Mother   . Asthma Mother   . Allergic rhinitis Mother   . Eczema Mother   . Cervical cancer Mother   . Diabetes Father   .  Hypertension Father   . Obesity Father   . Hyperlipidemia Father   . Hypertension Paternal Aunt   . Hypertension Maternal Grandfather   . Colon cancer Maternal Grandfather   . Diabetes Paternal Grandmother   . Obesity Paternal Grandmother   . Diabetes Paternal Grandfather   . Obesity Paternal Grandfather   . Angioedema Neg Hx   . Immunodeficiency Neg Hx   . Urticaria Neg Hx   . Stomach cancer Neg Hx   . Esophageal cancer Neg Hx     Social History   Tobacco Use  . Smoking status: Never Smoker  . Smokeless tobacco: Never Used  Vaping Use  . Vaping Use: Never used  Substance Use Topics  . Alcohol use: No    Alcohol/week: 0.0 standard drinks  . Drug use: No    Home Medications Prior to Admission medications   Medication Sig Start Date End Date Taking? Authorizing Provider  ARIPiprazole (ABILIFY) 10 MG tablet Take 10 mg by mouth in the morning and at bedtime.    Yes [provider]  atorvastatin (LIPITOR) 20 MG tablet Take 1 tablet (20 mg total) by mouth daily at 6 PM. 10/05/18  Yes Storm Frisk, MD  busPIRone (BUSPAR) 10 MG tablet Take 10 mg by mouth 2 (two) times daily.  11/19/18  Yes [provider]  Cyanocobalamin (VITAMIN B-12 PO) Take 1 tablet by mouth daily. Chew one gummy daily    Yes [provider]  diclofenac Sodium (VOLTAREN) 1 % GEL Apply 1 application topically 2 (two) times daily as needed (chest pains).    Yes [provider]  dicyclomine (BENTYL) 10 MG capsule Take 1 capsule (10 mg total) by mouth 4 (four) times daily as needed for spasms. 11/21/19  Yes Linwood Dibbles, MD  famotidine (PEPCID) 40 MG tablet Take 40 mg by mouth 2 (two) times daily.   Yes [provider]  hydrOXYzine (ATARAX/VISTARIL) 50 MG tablet Take 50 mg by mouth 2 (two) times daily.   Yes [provider]  insulin aspart (NOVOLOG FLEXPEN) 100 UNIT/ML FlexPen Inject 15 Units into the skin 2 (two) times daily with a meal. Only takes once a day bc  she only eats once a day   Yes [provider]  lamoTRIgine (LAMICTAL) 25 MG tablet Take 2 tablets (50 mg total) by mouth 2 (two) times daily. For mood stabilization Patient taking differently: Take 25 mg by mouth in the morning, at noon, and at bedtime. For mood stabilization 11/11/18  Yes Nwoko, Agnes I, NP  LANTUS SOLOSTAR 100 UNIT/ML Solostar Pen Inject 70 Units into the skin at bedtime.  07/05/19  Yes [provider]  lidocaine (LIDODERM) 5 % Place 1 patch onto the skin daily as needed (pain). Remove & Discard patch within 12 hours or as directed by MD   Yes [provider]  metoCLOPramide (REGLAN) 5 MG tablet Take 1 tablet (5 mg total) by mouth 3 (three)  times daily. Patient taking differently: Take 5 mg by mouth 3 (three) times daily as needed for nausea.  10/03/19  Yes Jerald Kief, MD  mirtazapine (REMERON) 7.5 MG tablet Take 1 tablet (7.5 mg total) by mouth at bedtime. For depression/sleep 11/11/18  Yes Armandina Stammer I, NP  Multiple Vitamin (MULTIVITAMIN WITH MINERALS) TABS tablet Take 1 tablet by mouth daily. 09/18/18  Yes Clapacs, Jackquline Denmark, MD  Olopatadine HCl (PAZEO) 0.7 % SOLN Place 3 drops into both eyes in the morning and at bedtime.   Yes [provider]  pantoprazole (PROTONIX) 40 MG tablet Take 1 tablet (40 mg total) by mouth 2 (two) times daily. For acid reflux Patient taking differently: Take 40 mg by mouth 2 (two) times daily as needed. For acid reflux 01/29/19  Yes Jae Dire, MD  prochlorperazine (COMPAZINE) 25 MG suppository Place 1 suppository (25 mg total) rectally every 12 (twelve) hours as needed for nausea or vomiting. 12/05/18  Yes Melene Plan, DO  propranolol (INDERAL) 20 MG tablet Take 1 tablet (20 mg total) by mouth 2 (two) times daily. For anxiety/HTN 12/23/18  Yes Swayze, Ava, DO  QUEtiapine (SEROQUEL) 100 MG tablet Take 300 mg by mouth at bedtime. 07/05/19  Yes [provider]  QUEtiapine (SEROQUEL) 50 MG tablet Take 50 mg  by mouth daily.    Yes [provider]  sucralfate (CARAFATE) 1 GM/10ML suspension Take 1 g by mouth 4 (four) times daily -  with meals and at bedtime.  07/05/19  Yes [provider]  topiramate (TOPAMAX) 25 MG tablet Take 1 tablet (25 mg total) by mouth 2 (two) times daily. For seizure activities 11/11/18  Yes Nwoko, Nicole Kindred I, NP  Caraway Oil-Levomenthol (FDGARD) 25-20.75 MG CAPS Take 2 capsules by mouth as needed. Take as directed as needed Patient not taking: Reported on 10/19/2019 03/21/19   Armbruster, Willaim Rayas, MD  lubiprostone (AMITIZA) 24 MCG capsule Take 1 capsule (24 mcg total) by mouth 2 (two) times daily with a meal. Patient not taking: Reported on 10/19/2019 10/27/18   Esterwood, Amy S, PA-C  polyethylene glycol (MIRALAX / GLYCOLAX) 17 g packet Take 17 g by mouth daily. Patient not taking: Reported on 10/19/2019 10/03/19   Jerald Kief, MD    Allergies    Tomato, Ibuprofen, and Oatmeal  Review of Systems   Review of Systems  Unable to perform ROS: Acuity of condition    Physical Exam Updated Vital Signs BP 123/88   Pulse (!) 115   Temp 98.2 F (36.8 C) (Oral)   Resp 18   Ht 5\' 3"  (1.6 m)   Wt 95.3 kg   LMP 11/06/2019   SpO2 100%   BMI 37.20 kg/m   Physical Exam Vitals and nursing note reviewed.  Constitutional:      Appearance: She is well-developed. She is ill-appearing.     Comments: Dry heaving into vomit bag.   HENT:     Head: Normocephalic and atraumatic.  Eyes:     Conjunctiva/sclera: Conjunctivae normal.  Cardiovascular:     Rate and Rhythm: Regular rhythm. Tachycardia present.     Pulses: Normal pulses.     Heart sounds: No murmur heard.   Pulmonary:     Effort: Pulmonary effort is normal. No respiratory distress.     Breath sounds: Normal breath sounds.  Abdominal:     Palpations: Abdomen is soft.     Tenderness: There is abdominal tenderness (Diffuse). There is guarding.  Musculoskeletal:  Cervical back: Normal range of  motion and neck supple.     Right lower leg: No edema.     Left lower leg: No edema.  Skin:    General: Skin is warm and dry.  Neurological:     Mental Status: She is alert.     Comments: Patient is awake.  She is not oriented to person, place, or time.  Spontaneous movement of all 4 extremities.  No clear seizure-like activity.  Patient movements are symmetric.  She follows commands.  Spontaneous movement of bilateral arms and legs.  Pulls are equal round reactive to light.  Psychiatric:     Comments: Anxious     ED Results / Procedures / Treatments   Labs (all labs ordered are listed, but only abnormal results are displayed) Labs Reviewed  BASIC METABOLIC PANEL - Abnormal; Notable for the following components:      Result Value   CO2 18 (*)    Glucose, Bld 395 (*)    Anion gap 18 (*)    All other components within normal limits  CBC - Abnormal; Notable for the following components:   RBC 5.37 (*)    MCV 78.6 (*)    MCH 25.3 (*)    All other components within normal limits  RAPID URINE DRUG SCREEN, HOSP PERFORMED - Abnormal; Notable for the following components:   Opiates POSITIVE (*)    Tetrahydrocannabinol POSITIVE (*)    All other components within normal limits  BETA-HYDROXYBUTYRIC ACID - Abnormal; Notable for the following components:   Beta-Hydroxybutyric Acid 2.55 (*)    All other components within normal limits  HEPATIC FUNCTION PANEL - Abnormal; Notable for the following components:   AST 12 (*)    All other components within normal limits  CBG MONITORING, ED - Abnormal; Notable for the following components:   Glucose-Capillary 420 (*)    All other components within normal limits  I-STAT CHEM 8, ED - Abnormal; Notable for the following components:   Creatinine, Ser 0.40 (*)    Glucose, Bld 390 (*)    Calcium, Ion 1.12 (*)    TCO2 21 (*)    All other components within normal limits  I-STAT VENOUS BLOOD GAS, ED - Abnormal; Notable for the following components:     pH, Ven 7.591 (*)    pCO2, Ven 23.4 (*)    pO2, Ven 188.0 (*)    Acid-Base Excess 3.0 (*)    Calcium, Ion 1.12 (*)    All other components within normal limits  CBG MONITORING, ED - Abnormal; Notable for the following components:   Glucose-Capillary 281 (*)    All other components within normal limits  CBG MONITORING, ED - Abnormal; Notable for the following components:   Glucose-Capillary 233 (*)    All other components within normal limits  ETHANOL  LIPASE, BLOOD  MAGNESIUM  LAMOTRIGINE LEVEL  URINALYSIS, COMPLETE (UACMP) WITH MICROSCOPIC  URINALYSIS, ROUTINE W REFLEX MICROSCOPIC  BASIC METABOLIC PANEL  BASIC METABOLIC PANEL  BASIC METABOLIC PANEL  HEMOGLOBIN A1C  BASIC METABOLIC PANEL  I-STAT BETA HCG BLOOD, ED ([facility], [facility], [facility] ONLY)  TROPONIN I (HIGH SENSITIVITY)  TROPONIN I (HIGH SENSITIVITY)    EKG EKG Interpretation  Date/Time:  Thursday November 24 2019 16:50:49 EST Ventricular Rate:  100 PR Interval:  116 QRS Duration: 94 QT Interval:  360 QTC Calculation: 465 R Axis:   73 Text Interpretation: Sinus tachycardia Consider right atrial enlargement No STEMI Confirmed by Alona Bene [phone]) on 11/24/2019  4:56:07 PM   Radiology DG Chest Portable 1 View  Result Date: 11/24/2019 CLINICAL DATA:  19 year old female with abdominal pain and seizure. EXAM: PORTABLE CHEST 1 VIEW COMPARISON:  Chest radiograph dated 11/13/2019. FINDINGS: The heart size and mediastinal contours are within normal limits. Both lungs are clear. The visualized skeletal structures are unremarkable. IMPRESSION: No active disease. Electronically Signed   By: Elgie Collard M.D.   On: 11/24/2019 17:28   DG Abd 2 Views  Result Date: 11/24/2019 CLINICAL DATA:  Intractable vomiting EXAM: ABDOMEN - 2 VIEW COMPARISON:  None. FINDINGS: The bowel gas pattern is normal. There is no evidence of free air. No radio-opaque calculi or other significant radiographic abnormality is seen. IMPRESSION: Negative.  Electronically Signed   By: Charlett Nose M.D.   On: 11/24/2019 20:10    Procedures .Critical Care Performed by: Cristina Gong, PA-C Authorized by: Cristina Gong, PA-C   Critical care provider statement:    Critical care time (minutes):  45   Critical care was necessary to treat or prevent imminent or life-threatening deterioration of the following conditions:  Endocrine crisis and metabolic crisis   Critical care was time spent personally by me on the following activities:  Discussions with consultants, evaluation of patient's response to treatment, examination of patient, ordering and performing treatments and interventions, ordering and review of laboratory studies, ordering and review of radiographic studies, pulse oximetry, re-evaluation of patient's condition, obtaining history from patient or surrogate and review of old charts   (including critical care time)  Medications Ordered in ED Medications  insulin regular, human (MYXREDLIN) 100 units/ 100 mL infusion (11.5 Units/hr Intravenous New Bag/Given 11/24/19 2228)  lactated ringers infusion ( Intravenous New Bag/Given 11/24/19 2229)  dextrose 5 % in lactated ringers infusion (has no administration in time range)  dextrose 50 % solution 0-50 mL (has no administration in time range)                        sodium chloride 0.9 % bolus 1,000 mL (1,000 mLs Intravenous New Bag/Given 11/24/19 1639)  LORazepam (ATIVAN) injection 1 mg (1 mg Intravenous Given 11/24/19 1638)  pantoprazole (PROTONIX) injection 40 mg (40 mg Intravenous Given 11/24/19 1748)  haloperidol lactate (HALDOL) injection 5 mg (5 mg Intravenous Given 11/24/19 1749)  promethazine (PHENERGAN) injection 12.5 mg (12.5 mg Intravenous Given 11/24/19 2217)    ED Course  I have reviewed the triage vital signs and the nursing notes.  Pertinent labs & imaging results that were available during my care of the patient were reviewed by me and considered in  my medical decision making (see chart for details).  Clinical Course as of Nov 23 2337  Thu Nov 24, 2019  1630 I was asked to evaluate patient.  She reportedly had a seizure in x-ray.  She did not fall.     [EH]  1829 Tetrahydrocannabinol(!): POSITIVE [EH]  2048 I spoke with hospitalist who will see patient for admission.    [EH]    Clinical Course User Index [EH] Norman Clay   MDM Rules/Calculators/A&P                         Patient is a 19 year old woman who presents today for evaluation of abdominal pain and chest pain.  She had a possible seizure versus pseudoseizure-like activity while getting her chest x-ray.  On my evaluation patient has significant abdominal tenderness.  Review shows that  she has many frequent ED visits for similar including 3 days ago with cyclic vomiting syndrome.  Suspect she has a combination of cannabinoid hyperemesis and diabetic gastroparesis.  She received IV Ativan for anxiety after her pseudoseizure/seizure episode while in the department.  She previously had her symptoms resolved after IV Haldol, here she was given IV Protonix, and IV Haldol without improvement in her symptoms.  Labs are obtained and reviewed, no significant leukocytosis.  BMP shows hyperglycemia at 395, CO2 is low at 18 with an anion gap of 18.  VBG shows alkalosis which I suspect is secondary to vomiting at 7.591. Her beta hydroxybutyric acid is elevated.  UDS is positive for opioids and THC.  Base is not elevated.  Chart review shows she has multiple prior abdominal CT scans and chronic abdominal pain.  Chest x-ray and two-view abdomen were obtained.  Chest x-ray does not show acute abnormalities.  Abdominal x-rays do not show obstructive bowel gas pattern, free air, or other significant acute abnormalities.   IV fluids, and IV insulin with endotool ordered.   Patient failed to improve in the ER.    Patient will require admission into the hospital, I spoke with Dr. Margo Aye who  will see her for admission.   Note: Portions of this report may have been transcribed using voice recognition software. Every effort was made to ensure accuracy; however, inadvertent computerized transcription errors may be present  Final Clinical Impression(s) / ED Diagnoses Final diagnoses:  Vomiting    Rx / DC Orders ED Discharge Orders    None       Cristina Gong, PA-C 11/24/19 2346    Maia Plan, MD 11/25/19 1052

## 2019-11-25 DIAGNOSIS — G40909 Epilepsy, unspecified, not intractable, without status epilepticus: Secondary | ICD-10-CM | POA: Diagnosis present

## 2019-11-25 DIAGNOSIS — G8929 Other chronic pain: Secondary | ICD-10-CM | POA: Diagnosis present

## 2019-11-25 DIAGNOSIS — E1010 Type 1 diabetes mellitus with ketoacidosis without coma: Secondary | ICD-10-CM | POA: Diagnosis not present

## 2019-11-25 DIAGNOSIS — F419 Anxiety disorder, unspecified: Secondary | ICD-10-CM | POA: Diagnosis present

## 2019-11-25 DIAGNOSIS — I10 Essential (primary) hypertension: Secondary | ICD-10-CM | POA: Diagnosis not present

## 2019-11-25 DIAGNOSIS — R109 Unspecified abdominal pain: Secondary | ICD-10-CM | POA: Diagnosis not present

## 2019-11-25 DIAGNOSIS — F339 Major depressive disorder, recurrent, unspecified: Secondary | ICD-10-CM | POA: Diagnosis not present

## 2019-11-25 DIAGNOSIS — K2970 Gastritis, unspecified, without bleeding: Secondary | ICD-10-CM | POA: Diagnosis present

## 2019-11-25 DIAGNOSIS — M545 Low back pain, unspecified: Secondary | ICD-10-CM | POA: Diagnosis present

## 2019-11-25 DIAGNOSIS — Z20822 Contact with and (suspected) exposure to covid-19: Secondary | ICD-10-CM | POA: Diagnosis present

## 2019-11-25 DIAGNOSIS — E876 Hypokalemia: Secondary | ICD-10-CM | POA: Diagnosis present

## 2019-11-25 DIAGNOSIS — Z91018 Allergy to other foods: Secondary | ICD-10-CM | POA: Diagnosis not present

## 2019-11-25 DIAGNOSIS — Z87892 Personal history of anaphylaxis: Secondary | ICD-10-CM | POA: Diagnosis not present

## 2019-11-25 DIAGNOSIS — R1110 Vomiting, unspecified: Secondary | ICD-10-CM | POA: Diagnosis present

## 2019-11-25 DIAGNOSIS — E860 Dehydration: Secondary | ICD-10-CM | POA: Diagnosis present

## 2019-11-25 DIAGNOSIS — Z794 Long term (current) use of insulin: Secondary | ICD-10-CM | POA: Diagnosis not present

## 2019-11-25 DIAGNOSIS — E1143 Type 2 diabetes mellitus with diabetic autonomic (poly)neuropathy: Secondary | ICD-10-CM

## 2019-11-25 DIAGNOSIS — Z9119 Patient's noncompliance with other medical treatment and regimen: Secondary | ICD-10-CM | POA: Diagnosis not present

## 2019-11-25 DIAGNOSIS — F1910 Other psychoactive substance abuse, uncomplicated: Secondary | ICD-10-CM

## 2019-11-25 DIAGNOSIS — E1043 Type 1 diabetes mellitus with diabetic autonomic (poly)neuropathy: Secondary | ICD-10-CM | POA: Diagnosis present

## 2019-11-25 DIAGNOSIS — F449 Dissociative and conversion disorder, unspecified: Secondary | ICD-10-CM | POA: Diagnosis present

## 2019-11-25 DIAGNOSIS — Z833 Family history of diabetes mellitus: Secondary | ICD-10-CM | POA: Diagnosis not present

## 2019-11-25 DIAGNOSIS — R569 Unspecified convulsions: Secondary | ICD-10-CM

## 2019-11-25 DIAGNOSIS — I509 Heart failure, unspecified: Secondary | ICD-10-CM | POA: Diagnosis present

## 2019-11-25 DIAGNOSIS — K3184 Gastroparesis: Secondary | ICD-10-CM | POA: Diagnosis present

## 2019-11-25 DIAGNOSIS — E785 Hyperlipidemia, unspecified: Secondary | ICD-10-CM | POA: Diagnosis present

## 2019-11-25 DIAGNOSIS — F332 Major depressive disorder, recurrent severe without psychotic features: Secondary | ICD-10-CM | POA: Diagnosis present

## 2019-11-25 DIAGNOSIS — I110 Hypertensive heart disease with heart failure: Secondary | ICD-10-CM | POA: Diagnosis present

## 2019-11-25 DIAGNOSIS — Z886 Allergy status to analgesic agent status: Secondary | ICD-10-CM | POA: Diagnosis not present

## 2019-11-25 LAB — BASIC METABOLIC PANEL
Anion gap: 14 (ref 5–15)
Anion gap: 11 (ref 5–15)
BUN: 7 mg/dL (ref 6–20)
BUN: 7 mg/dL (ref 6–20)
CO2: 22 mmol/L (ref 22–32)
CO2: 22 mmol/L (ref 22–32)
Calcium: 9.4 mg/dL (ref 8.9–10.3)
Calcium: 8.7 mg/dL — ABNORMAL LOW (ref 8.9–10.3)
Chloride: 102 mmol/L (ref 98–111)
Chloride: 104 mmol/L (ref 98–111)
Creatinine, Ser: 0.61 mg/dL (ref 0.44–1.00)
Creatinine, Ser: 0.58 mg/dL (ref 0.44–1.00)
GFR, Estimated: >60 mL/min (ref >60)
GFR, Estimated: >60 mL/min (ref >60)
Glucose, Bld: 129 mg/dL — ABNORMAL HIGH (ref 70–99)
Glucose, Bld: 220 mg/dL — ABNORMAL HIGH (ref 70–99)
Potassium: 3.2 mmol/L — ABNORMAL LOW (ref 3.5–5.1)
Potassium: 3.1 mmol/L — ABNORMAL LOW (ref 3.5–5.1)
Sodium: 138 mmol/L (ref 135–145)
Sodium: 137 mmol/L (ref 135–145)

## 2019-11-25 LAB — GLUCOSE, CAPILLARY
Glucose-Capillary: 122 mg/dL — ABNORMAL HIGH (ref 70–99)
Glucose-Capillary: 149 mg/dL — ABNORMAL HIGH (ref 70–99)
Glucose-Capillary: 156 mg/dL — ABNORMAL HIGH (ref 70–99)
Glucose-Capillary: 189 mg/dL — ABNORMAL HIGH (ref 70–99)
Glucose-Capillary: 194 mg/dL — ABNORMAL HIGH (ref 70–99)
Glucose-Capillary: 198 mg/dL — ABNORMAL HIGH (ref 70–99)
Glucose-Capillary: 198 mg/dL — ABNORMAL HIGH (ref 70–99)
Glucose-Capillary: 233 mg/dL — ABNORMAL HIGH (ref 70–99)
Glucose-Capillary: 287 mg/dL — ABNORMAL HIGH (ref 70–99)
Glucose-Capillary: 92 mg/dL (ref 70–99)

## 2019-11-25 LAB — LAMOTRIGINE LEVEL: Lamotrigine Lvl: <1 ug/mL — ABNORMAL LOW (ref 2.0–20.0)

## 2019-11-25 LAB — HEMOGLOBIN A1C
Hgb A1c MFr Bld: 11.7 % — ABNORMAL HIGH (ref 4.8–5.6)
Mean Plasma Glucose: 289.09 mg/dL

## 2019-11-25 LAB — RESPIRATORY PANEL BY RT PCR (FLU A&B, COVID)
Influenza A by PCR: NEGATIVE
Influenza B by PCR: NEGATIVE
SARS Coronavirus 2 by RT PCR: NEGATIVE

## 2019-11-25 MED ORDER — METOCLOPRAMIDE HCL 5 MG PO TABS
5.0000 mg | ORAL_TABLET | Freq: Three times a day (TID) | ORAL | Status: DC
  Administered 2019-11-25 – 2019-11-27 (×6): 5 mg via ORAL
  Filled 2019-11-25 (×6): qty 1

## 2019-11-25 MED ORDER — INSULIN ASPART 100 UNIT/ML ~~LOC~~ SOLN
0.0000 [IU] | Freq: Three times a day (TID) | SUBCUTANEOUS | Status: DC
  Administered 2019-11-25: 8 [IU] via SUBCUTANEOUS
  Administered 2019-11-25: 5 [IU] via SUBCUTANEOUS
  Administered 2019-11-25 – 2019-11-26 (×2): 3 [IU] via SUBCUTANEOUS
  Administered 2019-11-26: 5 [IU] via SUBCUTANEOUS
  Administered 2019-11-27: 2 [IU] via SUBCUTANEOUS

## 2019-11-25 MED ORDER — INSULIN ASPART 100 UNIT/ML ~~LOC~~ SOLN: 0.0000 [IU] | Freq: Every day | SUBCUTANEOUS | Status: DC

## 2019-11-25 MED ORDER — POTASSIUM CHLORIDE CRYS ER 20 MEQ PO TBCR
40.0000 meq | EXTENDED_RELEASE_TABLET | ORAL | Status: AC
  Administered 2019-11-25: 40 meq via ORAL
  Filled 2019-11-25 (×2): qty 2

## 2019-11-25 MED ORDER — MIRTAZAPINE 15 MG PO TABS
7.5000 mg | ORAL_TABLET | Freq: Every day | ORAL | Status: DC
  Administered 2019-11-25 – 2019-11-26 (×2): 7.5 mg via ORAL
  Filled 2019-11-25 (×2): qty 1

## 2019-11-25 MED ORDER — LACTATED RINGERS IV SOLN: INTRAVENOUS | Status: DC

## 2019-11-25 MED ORDER — PROCHLORPERAZINE 25 MG RE SUPP
25.0000 mg | Freq: Two times a day (BID) | RECTAL | Status: DC | PRN
  Administered 2019-11-25: 25 mg via RECTAL
  Filled 2019-11-25: qty 1

## 2019-11-25 MED ORDER — PANTOPRAZOLE SODIUM 40 MG PO TBEC
40.0000 mg | DELAYED_RELEASE_TABLET | Freq: Two times a day (BID) | ORAL | Status: DC
  Administered 2019-11-25 – 2019-11-27 (×4): 40 mg via ORAL
  Filled 2019-11-25 (×5): qty 1

## 2019-11-25 MED ORDER — INSULIN GLARGINE 100 UNIT/ML ~~LOC~~ SOLN
45.0000 [IU] | Freq: Every day | SUBCUTANEOUS | Status: DC
  Administered 2019-11-25 – 2019-11-26 (×3): 45 [IU] via SUBCUTANEOUS
  Filled 2019-11-25 (×4): qty 0.45

## 2019-11-25 MED ORDER — ONDANSETRON HCL 4 MG/2ML IJ SOLN
4.0000 mg | INTRAMUSCULAR | Status: DC | PRN
  Administered 2019-11-25 – 2019-11-27 (×4): 4 mg via INTRAVENOUS
  Filled 2019-11-25 (×4): qty 2

## 2019-11-25 MED ORDER — ENOXAPARIN SODIUM 40 MG/0.4ML ~~LOC~~ SOLN
40.0000 mg | Freq: Every day | SUBCUTANEOUS | Status: DC
  Administered 2019-11-25 – 2019-11-27 (×3): 40 mg via SUBCUTANEOUS
  Filled 2019-11-25 (×3): qty 0.4

## 2019-11-25 MED ORDER — QUETIAPINE FUMARATE 50 MG PO TABS
50.0000 mg | ORAL_TABLET | Freq: Every day | ORAL | Status: DC
  Administered 2019-11-26 – 2019-11-27 (×2): 50 mg via ORAL
  Filled 2019-11-25 (×3): qty 1

## 2019-11-25 MED ORDER — ADULT MULTIVITAMIN W/MINERALS CH
1.0000 | ORAL_TABLET | Freq: Every day | ORAL | Status: DC
  Administered 2019-11-26: 1 via ORAL
  Filled 2019-11-25 (×3): qty 1

## 2019-11-25 MED ORDER — ENSURE ENLIVE PO LIQD
237.0000 mL | Freq: Two times a day (BID) | ORAL | Status: DC
  Administered 2019-11-25 – 2019-11-26 (×2): 237 mL via ORAL
  Filled 2019-11-25 (×2): qty 237

## 2019-11-25 MED ORDER — INSULIN ASPART 100 UNIT/ML ~~LOC~~ SOLN
4.0000 [IU] | Freq: Three times a day (TID) | SUBCUTANEOUS | Status: DC
  Administered 2019-11-25 – 2019-11-27 (×7): 4 [IU] via SUBCUTANEOUS

## 2019-11-25 MED ORDER — POTASSIUM CHLORIDE 10 MEQ/100ML IV SOLN
10.0000 meq | INTRAVENOUS | Status: AC
  Administered 2019-11-25 (×4): 10 meq via INTRAVENOUS
  Filled 2019-11-25 (×4): qty 100

## 2019-11-25 MED ORDER — MORPHINE SULFATE (PF) 2 MG/ML IV SOLN
2.0000 mg | INTRAVENOUS | Status: DC | PRN
  Administered 2019-11-25 – 2019-11-27 (×8): 2 mg via INTRAVENOUS
  Filled 2019-11-25 (×8): qty 1

## 2019-11-25 MED ORDER — OXYCODONE HCL 5 MG PO TABS
5.0000 mg | ORAL_TABLET | ORAL | Status: DC | PRN
  Administered 2019-11-25 – 2019-11-27 (×8): 5 mg via ORAL
  Filled 2019-11-25 (×8): qty 1

## 2019-11-25 MED ORDER — OLOPATADINE HCL 0.1 % OP SOLN
1.0000 [drp] | Freq: Two times a day (BID) | OPHTHALMIC | Status: DC
  Administered 2019-11-25 – 2019-11-26 (×2): 1 [drp] via OPHTHALMIC
  Filled 2019-11-25: qty 5

## 2019-11-25 MED ORDER — MORPHINE SULFATE (PF) 2 MG/ML IV SOLN
1.0000 mg | INTRAVENOUS | Status: DC | PRN
  Administered 2019-11-25 (×3): 1 mg via INTRAVENOUS
  Filled 2019-11-25 (×3): qty 1

## 2019-11-25 MED ORDER — QUETIAPINE FUMARATE 100 MG PO TABS
300.0000 mg | ORAL_TABLET | Freq: Every day | ORAL | Status: DC
  Administered 2019-11-25 – 2019-11-26 (×3): 300 mg via ORAL
  Filled 2019-11-25 (×3): qty 3

## 2019-11-25 NOTE — Plan of Care (Signed)
  Problem: Education: Goal: Ability to describe self-care measures that may prevent or decrease complications (Diabetes Survival Skills Education) will improve Outcome: Progressing Goal: Individualized Educational Video(s) Outcome: Progressing   Problem: Coping: Goal: Ability to adjust to condition or change in health will improve Outcome: Progressing   Problem: Fluid Volume: Goal: Ability to maintain a balanced intake and output will improve Outcome: Progressing   Problem: Health Behavior/Discharge Planning: Goal: Ability to identify and utilize available resources and services will improve Outcome: Progressing Goal: Ability to manage health-related needs will improve Outcome: Progressing   Problem: Metabolic: Goal: Ability to maintain appropriate glucose levels will improve Outcome: Progressing   Problem: Nutritional: Goal: Maintenance of adequate nutrition will improve Outcome: Progressing Goal: Progress toward achieving an optimal weight will improve Outcome: Progressing   Problem: Skin Integrity: Goal: Risk for impaired skin integrity will decrease Outcome: Progressing   Problem: Tissue Perfusion: Goal: Adequacy of tissue perfusion will improve Outcome: Progressing   Problem: Education: Goal: Expressions of having a comfortable level of knowledge regarding the disease process will increase Outcome: Progressing   Problem: Coping: Goal: Ability to adjust to condition or change in health will improve Outcome: Progressing Goal: Ability to identify appropriate support needs will improve Outcome: Progressing   Problem: Health Behavior/Discharge Planning: Goal: Compliance with prescribed medication regimen will improve Outcome: Progressing   Problem: Medication: Goal: Risk for medication side effects will decrease Outcome: Progressing   Problem: Clinical Measurements: Goal: Complications related to the disease process, condition or treatment will be avoided or  minimized Outcome: Progressing Goal: Diagnostic test results will improve Outcome: Progressing   Problem: Safety: Goal: Verbalization of understanding the information provided will improve Outcome: Progressing   Problem: Self-Concept: Goal: Level of anxiety will decrease Outcome: Progressing Goal: Ability to verbalize feelings about condition will improve Outcome: Progressing   

## 2019-11-25 NOTE — Progress Notes (Signed)
Notified Dr. Antionette Char, Hospitalist on call regarding patient blood sugar trend, request for pain medication and request for sleeping medication. Orders were entered by Dr. Antionette Char.

## 2019-11-25 NOTE — Progress Notes (Signed)
PROGRESS NOTE    Nancy [name]  YCX:[id number] DOB: December 29, 2000 DOA: 11/24/2019 PCP: Fleet Contras, MD    Brief Narrative:  Nancy [name] was admitted to the hospital with a working diagnosis of diabetes ketoacidosis.  19 year old female past medical history for type 1 diabetes mellitus, Candida esophagitis, gastritis, gastroparesis, hypertension, dyslipidemia, polysubstance abuse, seizure disorder, conversion disorder, anxiety, and depression.  Patient reported 4 days of intractable nausea and vomiting, unable to take any p.o.  Symptoms associated with epigastric abdominal pain, and chills.  Positive polydipsia and polyuria.  Her symptoms were refractive to outpatient management in the ED with intravenous Haldol, lorazepam and pantoprazole.  While on radiology she had a seizure-like activity.  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  On her initial physical examination blood pressure 127/62, heart rate 106, temperature 98.2, respirate 24, oxygen saturation 100%, her lungs are clear to auscultation bilaterally, heart S1-S2, present and rhythmic, her abdomen had diffuse tenderness to palpation, more in the epigastric region, no lower extremity edema. Sodium 137, potassium 3.7, chloride 101, bicarb 18, glucose 395, BUN 7, creatinine 0.73, anion gap 18, white count 9.1, hemoglobin 13.6, hematocrit 42.2, platelets 393.  Toxicology screen positive for opiates and tetrahydrocannabinol. Chest film with no infiltrates.  EKG, 115 bpm, normal axis, normal intervals, sinus rhythm, J-point elevation V1-V3, no ST segment or T wave changes.   Assessment & Plan:   Principal Problem:   DKA, type 1 (HCC) Active Problems:   Hypertension   Non compliance with medical treatment   Chronic abdominal pain   Depression   MDD (major depressive disorder), recurrent severe, without psychosis (HCC)   Pseudoseizures (HCC)   Diabetic gastroparesis (HCC)   Polysubstance abuse (HCC)   1. Diabetic  ketoacidosis/ T1DM (uncontrolled)/ dyslpidemia. Patient with improvement in po intake but not yet back to baseline, continue with nausea.   Anion gap this am is 11 and fasting glucose 220.   She has been transitioned to sq insulin with 45 units, pre-meal 4 units and insulin sliding scale. Continue glucose monitoring, patient not fully tolerating po.  Continue with atorvastatin.   2. Intractable nausea and vomiting. CT of the abdomen and pelvis on 09/21 with no acute findings. Resume metoclopramide before meals, continue antiacid therapy with pantoprazole and sucralfate.  Patient has chronic abdominal pian and hx of gastroparesis. She reports being on opiates at home, I called her pharmacy [facility] and no record of narcotic medications.   Continue pain control with IV morphine and oral oxycodone.  Add gentle IV fluids with balanced electrolyte solutions.   3. Polysubstance abuse. Toxicology screen positive for THC and opiates.   4. Depression, anxiety. Continue with mirtazapine, lamotrigine, hydroxyzine, buspirone and aripirazole.    Patient continue to be at high risk for worsening nausea and vomiting.   5. Hypokalemia. K this am is down to 3,1 with bicarbonate at 22 and serum cr at 0,58  Add 80 meq Kcl in 2 divided doses and follow up on renal function in am.   Status is: Observation  The patient will require care spanning > 2 midnights and should be moved to inpatient because: IV treatments appropriate due to intensity of illness or inability to take PO  Dispo:  Patient From: Home  Planned Disposition: Home  Expected discharge date: 11/25/19  Medically stable for discharge: No   DVT prophylaxis: enoxaparin   Code Status:   full  Family Communication:  No family at the bedside     Subjective: Patient  continue to have nausea, mild improvement in po intake but not yet back to baseline, continue to have abdominal pain.   Objective: Vitals:   11/25/19 0000 11/25/19 0200  11/25/19 0455 11/25/19 0819  BP:  108/66 (!) 105/55 (!) 131/97  Pulse:  78 94 (!) 101  Resp:  20 20 16   Temp:  98 F (36.7 C) 98.1 F (36.7 C) 98.2 F (36.8 C)  TempSrc:  Oral Oral Oral  SpO2:  100% 100% 100%  Weight: 87.3 kg     Height: 5\' 3"  (1.6 m)       Intake/Output Summary (Last 24 hours) at 11/25/2019 0923 Last data filed at 11/25/2019 11/27/2019 Gross per 24 hour  Intake 1487.5 ml  Output --  Net 1487.5 ml   Filed Weights   11/24/19 1501 11/25/19 0000  Weight: 95.3 kg 87.3 kg    Examination:   General: Not in pain or dyspnea, deconditioned  Neurology: Awake and alert, non focal  E ENT: no pallor, no icterus, oral mucosa moist Cardiovascular: No JVD. S1-S2 present, rhythmic, no gallops, rubs, or murmurs. No lower extremity edema. Pulmonary: positive breath sounds bilaterally, adequate air movement, no wheezing, rhonchi or rales. Gastrointestinal. Abdomen soft, mild tender to superficial palpation, no rebound.  Skin. No rashes Musculoskeletal: no joint deformities     Data Reviewed: I have personally reviewed following labs and imaging studies  CBC: Recent Labs  Lab 11/21/19 0737 11/21/19 0802 11/24/19 1515 11/24/19 1634 11/24/19 1709  WBC 11.2*  --  9.1  --   --   NEUTROABS 8.7*  --   --   --   --   HGB 13.9 15.3* 13.6 15.0 14.6  HCT 43.1 45.0 42.2 44.0 43.0  MCV 79.5*  --  78.6*  --   --   PLT 350  --  393  --   --    Basic Metabolic Panel: Recent Labs  Lab 11/21/19 0737 11/21/19 0802 11/24/19 1515 11/24/19 1634 11/24/19 1709 11/24/19 1800 11/25/19 0042 11/25/19 0459  NA 132*   < > 137 138 137  --  138 137  K 5.0   < > 3.7 4.0 4.0  --  3.2* 3.1*  CL 93*  --  101 103  --   --  102 104  CO2 19*  --  18*  --   --   --  22 22  GLUCOSE 411*  --  395* 390*  --   --  129* 220*  BUN 10  --  7 7  --   --  7 7  CREATININE 0.80  --  0.73 0.40*  --   --  0.61 0.58  CALCIUM 9.4  --  10.1  --   --   --  9.4 8.7*  MG  --   --   --   --   --  1.8  --   --     < > = values in this interval not displayed.   GFR: Estimated Creatinine Clearance: 116.6 mL/min (by C-G formula based on SCr of 0.58 mg/dL). Liver Function Tests: Recent Labs  Lab 11/21/19 0737 11/24/19 1800  AST 24 12*  ALT 13 13  ALKPHOS 85 80  BILITOT 1.8* 0.7  PROT 7.4 6.6  ALBUMIN 4.2 3.8   Recent Labs  Lab 11/21/19 0737 11/24/19 1800  LIPASE 24 25   No results for input(s): AMMONIA in the last 168 hours. Coagulation Profile: No results for input(s): INR, PROTIME in  the last 168 hours. Cardiac Enzymes: No results for input(s): CKTOTAL, CKMB, CKMBINDEX, TROPONINI in the last 168 hours. BNP (last 3 results) No results for input(s): PROBNP in the last 8760 hours. HbA1C: Recent Labs    11/25/19 0042  HGBA1C 11.7*   CBG: Recent Labs  Lab 11/25/19 0154 11/25/19 0232 11/25/19 0335 11/25/19 0451 11/25/19 0713  GLUCAP 122* 149* 198* 198* 287*   Lipid Profile: No results for input(s): CHOL, HDL, LDLCALC, TRIG, CHOLHDL, LDLDIRECT in the last 72 hours. Thyroid Function Tests: No results for input(s): TSH, T4TOTAL, FREET4, T3FREE, THYROIDAB in the last 72 hours. Anemia Panel: No results for input(s): VITAMINB12, FOLATE, FERRITIN, TIBC, IRON, RETICCTPCT in the last 72 hours.    Radiology Studies: I have reviewed all of the imaging during this hospital visit personally     Scheduled Meds: . ARIPiprazole  10 mg Oral Daily  . atorvastatin  20 mg Oral q1800  . busPIRone  10 mg Oral BID  . enoxaparin (LOVENOX) injection  40 mg Subcutaneous Daily  . famotidine  40 mg Oral BID  . feeding supplement  237 mL Oral BID BM  . hydrOXYzine  50 mg Oral BID  . insulin aspart  0-15 Units Subcutaneous TID WC  . insulin aspart  0-5 Units Subcutaneous QHS  . insulin aspart  4 Units Subcutaneous TID WC  . insulin glargine  45 Units Subcutaneous QHS  . lamoTRIgine  25 mg Oral BID  . propranolol  20 mg Oral BID  . QUEtiapine  300 mg Oral QHS  . sucralfate  1 g Oral TID  WC & HS  . vitamin B-12  500 mcg Oral Daily   Continuous Infusions: . dextrose 5% lactated ringers 125 mL/hr at 11/25/19 9417  . potassium chloride 10 mEq (11/25/19 0819)     LOS: 0 days        Raaga Maeder Annett Gula, MD

## 2019-11-25 NOTE — Progress Notes (Signed)
Unable to reach [facility]/Dr Tye Savoy office for hospital discharge appt.  No answer, unable to leave message. Daleen Squibb, MSW, LCSW 11/19/20212:57 PM

## 2019-11-25 NOTE — Plan of Care (Signed)
  Problem: Education: Goal: Ability to describe self-care measures that may prevent or decrease complications (Diabetes Survival Skills Education) will improve Outcome: Progressing Goal: Individualized Educational Video(s) Outcome: Progressing   Problem: Coping: Goal: Ability to adjust to condition or change in health will improve Outcome: Progressing   Problem: Fluid Volume: Goal: Ability to maintain a balanced intake and output will improve Outcome: Progressing   Problem: Health Behavior/Discharge Planning: Goal: Ability to identify and utilize available resources and services will improve Outcome: Progressing Goal: Ability to manage health-related needs will improve Outcome: Progressing   Problem: Metabolic: Goal: Ability to maintain appropriate glucose levels will improve Outcome: Progressing   Problem: Nutritional: Goal: Maintenance of adequate nutrition will improve Outcome: Progressing Goal: Progress toward achieving an optimal weight will improve Outcome: Progressing   Problem: Skin Integrity: Goal: Risk for impaired skin integrity will decrease Outcome: Progressing   Problem: Tissue Perfusion: Goal: Adequacy of tissue perfusion will improve Outcome: Progressing   Problem: Education: Goal: Expressions of having a comfortable level of knowledge regarding the disease process will increase Outcome: Progressing   Problem: Coping: Goal: Ability to adjust to condition or change in health will improve Outcome: Progressing Goal: Ability to identify appropriate support needs will improve Outcome: Progressing   Problem: Health Behavior/Discharge Planning: Goal: Compliance with prescribed medication regimen will improve Outcome: Progressing   Problem: Medication: Goal: Risk for medication side effects will decrease Outcome: Progressing   Problem: Clinical Measurements: Goal: Complications related to the disease process, condition or treatment will be avoided or  minimized Outcome: Progressing Goal: Diagnostic test results will improve Outcome: Progressing   Problem: Safety: Goal: Verbalization of understanding the information provided will improve Outcome: Progressing   Problem: Self-Concept: Goal: Level of anxiety will decrease Outcome: Progressing Goal: Ability to verbalize feelings about condition will improve Outcome: Progressing

## 2019-11-25 NOTE — Progress Notes (Signed)
Initial Nutrition Assessment  DOCUMENTATION CODES:   Not applicable  INTERVENTION:    Ensure Enlive po BID, each supplement provides 350 kcal and 20 grams of protein  MVI daily   NUTRITION DIAGNOSIS:   Inadequate oral intake related to nausea, vomiting as evidenced by per patient/family report.  GOAL:   Patient will meet greater than or equal to 90% of their needs  MONITOR:   PO intake, Supplement acceptance, Labs, I & O's, Weight trends  REASON FOR ASSESSMENT:   Malnutrition Screening Tool    ASSESSMENT:   Patient with PMH significant for type 1 DM, candida esophagitis, gastroparesis, HTN, dyslipidemia, polysubstance use, and seizure disorder. Presents this admission with DKA.   Pt endorses a loss in appetite over the last 4 days due to ongoing nausea/vomiting. States during this time she consumed one meal that consisted of yogurt/fruit and snacks on peanut butter crackers. Pt states during her last GI visit they ruled out gastroparesis so she is unsure why she continues to feel nauseous. Has lunch tray at bedside but does not feel well enough to eat. Discussed the importance of protein intake for preservation of lean body mass. Pt willing to continue Ensure.   Pt reports a UBW of 215 lb and a recent wt loss of 15 lb in the last week. Records show pt weighed 215 lb on 8/29 and 192 lb this admission (10.6% wt loss in 3 months, significant for time frame). Will need NFPE to assess for malnutrition.   Drips: LR @ 75 ml/hr  Medications: SS novolog, lantus, 5 mg reglan TID, remeron, vitamin B12 Labs: K 3.1 (L) CBG 149-287  Diet Order:   Diet Order            Diet Carb Modified Fluid consistency: Thin; Room service appropriate? Yes  Diet effective now                 EDUCATION NEEDS:   Not appropriate for education at this time  Skin:  Skin Assessment: Skin Integrity Issues: Skin Integrity Issues:: Incisions Incisions: R jaw  Last BM:  11/18  Height:   Ht  Readings from Last 1 Encounters:  11/25/19 5\' 3"  (1.6 m)    Weight:   Wt Readings from Last 1 Encounters:  11/25/19 87.3 kg   BMI:  Body mass index is 34.09 kg/m.  Estimated Nutritional Needs:   Kcal:  1800-2000 kcal  Protein:  80-95 grams  Fluid:  >/= 1.8 L/day  11/27/19 RD, LDN Clinical Nutrition Pager listed in [facility]

## 2019-11-25 NOTE — Progress Notes (Addendum)
Inpatient Diabetes Program Recommendations  AACE/ADA: New Consensus Statement on Inpatient Glycemic Control (2015)  Target Ranges:  Prepandial:   less than 140 mg/dL      Peak postprandial:   less than 180 mg/dL (1-2 hours)      Critically ill patients:  140 - 180 mg/dL   Results for CRICKETT, ABBETT (MRN [id number]) as of 11/25/2019 10:19  Ref. Range 11/24/2019 16:27 11/24/2019 21:36 11/24/2019 23:12 11/25/2019 00:15 11/25/2019 01:11 11/25/2019 01:54 11/25/2019 02:32 11/25/2019 03:35 11/25/2019 04:51 11/25/2019 07:13  Glucose-Capillary Latest Ref Range: 70 - 99 mg/dL 420 (H) 281 (H)  IV Insulin drip started 10:30pm 233 (H) 156 (H) 92 122 (H) 149 (H) 198 (H)  45 units LANTUS given 198 (H)  IV Insulin drip stopped 287 (H)  12 units NOVOLOG    Results for INETTE, DOUBRAVA (MRN [id number]) as of 11/25/2019 05:38  Ref. Range 12/19/2018 00:20 02/27/2019 02:10 08/06/2019 17:10 11/25/2019 00:42  Hemoglobin A1C Latest Ref Range: 4.8 - 5.6 % 11.3 (H) 13.4 (H) 11.8 (H) 11.7 (H)  (289 mg/dl)     Admit: Mild DKA, suspect noncompliance/ Intractable nausea and vomiting in the setting of DKA type I  History: Diabetes, Polysubstance Abuse, CHF  Home DM Meds: Lantus 70 units QHS       Novolog 15 units BID (only takes once per day b/c pt states she only eats once per day  Current Orders: Lantus 45 units QHS      Novolog 0-15 units ac/hs      Novolog 4 units TID with meals for meal coverage      This is pt's 6th admission since January 2021 with multiple ED visits as well.  Was counseled by the Diabetes Coordinator in both February and August of this year.  Consistently elevated A1cs noted despite attempts at counseling the pt on the importance of good CBG control.  IV Insulin Drip ran from 10:30pm until 5am today--45 units Lantus administered at 3:30am  Novolog SSI and Novolog meal coverage both to start this AM    Addendum 12:10pm--Met w/ pt at bedside to discuss long-standing  issues with elevated CBGs at home as evidenced by A1c levels >11% since December 2020.  Pt verified home insulins as above.  States she has access to insulin and has Rxs at home.  Takes insulin as prescribed.  Reviewed with pt how we transitioned her to SQ Insulin this AM and that we will monitor her CBGs frequently and that the MDs will adjust her insulin doses in the hospital as needed to try to achieve good CBG control.  Shared my concern with pt that her A1c levels are much higher than goal.  Pt told me she is currently working with her PCP to get into see an Endocrinologist again.  Had some temporary issues with her insurance but stated to me her insurance issues are fixed and that she has contacted ENDO with [facility] to schedule an appt.  Currently can't be seen until February 2022 (per pt), however, pt told me she has asked to be on the cancellation list so the MD will call her sooner if possible  Strongly encouraged pt to follow with an ENDO given her risk for serious complications from her uncontrolled CBGs.  Also strongly encouraged pt to check her CBGs at least TID before meals at home so she can have plenty of CBG data to take with her to her ENDO appt.  Pt told me she has an interest in getting  an insulin pump.  Reminded pt she will need to vastly improve her CBG control prior to getting an insulin pump.    --Will follow patient during hospitalization--  Wyn Quaker RN, MSN, CDE Diabetes Coordinator Inpatient Glycemic Control Team Team Pager: [phone] (8a-5p)

## 2019-11-26 DIAGNOSIS — F332 Major depressive disorder, recurrent severe without psychotic features: Secondary | ICD-10-CM

## 2019-11-26 DIAGNOSIS — K2970 Gastritis, unspecified, without bleeding: Secondary | ICD-10-CM

## 2019-11-26 DIAGNOSIS — R109 Unspecified abdominal pain: Secondary | ICD-10-CM | POA: Diagnosis not present

## 2019-11-26 DIAGNOSIS — E1010 Type 1 diabetes mellitus with ketoacidosis without coma: Secondary | ICD-10-CM | POA: Diagnosis not present

## 2019-11-26 DIAGNOSIS — F339 Major depressive disorder, recurrent, unspecified: Secondary | ICD-10-CM | POA: Diagnosis not present

## 2019-11-26 LAB — BASIC METABOLIC PANEL
Anion gap: 8 (ref 5–15)
BUN: 7 mg/dL (ref 6–20)
CO2: 25 mmol/L (ref 22–32)
Calcium: 9 mg/dL (ref 8.9–10.3)
Chloride: 103 mmol/L (ref 98–111)
Creatinine, Ser: 0.98 mg/dL (ref 0.44–1.00)
GFR, Estimated: >60 mL/min (ref >60)
Glucose, Bld: 287 mg/dL — ABNORMAL HIGH (ref 70–99)
Potassium: 3.5 mmol/L (ref 3.5–5.1)
Sodium: 136 mmol/L (ref 135–145)

## 2019-11-26 LAB — GLUCOSE, CAPILLARY
Glucose-Capillary: 227 mg/dL — ABNORMAL HIGH (ref 70–99)
Glucose-Capillary: 128 mg/dL — ABNORMAL HIGH (ref 70–99)
Glucose-Capillary: 167 mg/dL — ABNORMAL HIGH (ref 70–99)
Glucose-Capillary: 166 mg/dL — ABNORMAL HIGH (ref 70–99)

## 2019-11-26 MED ORDER — PROMETHAZINE HCL 25 MG/ML IJ SOLN
12.5000 mg | Freq: Four times a day (QID) | INTRAMUSCULAR | Status: DC | PRN
  Administered 2019-11-26 – 2019-11-27 (×2): 12.5 mg via INTRAVENOUS
  Filled 2019-11-26 (×3): qty 1

## 2019-11-26 MED ORDER — POTASSIUM CHLORIDE CRYS ER 20 MEQ PO TBCR
40.0000 meq | EXTENDED_RELEASE_TABLET | Freq: Once | ORAL | Status: AC
  Administered 2019-11-26: 40 meq via ORAL
  Filled 2019-11-26: qty 2

## 2019-11-26 NOTE — Progress Notes (Signed)
Patient is on her period at this time.

## 2019-11-26 NOTE — Evaluation (Signed)
Physical Therapy Evaluation & Discharge Patient Details Name: Nancy [name] MRN: [id number] DOB: [birth year] Today's Date: 11/26/2019   History of Present Illness  Pt is a 19 y/o female admitted secondary to intractable n/v. Pt found to be in Diabetic ketoacidosis. PMH including but not limited to DM type 1, CHF and seizures.    Clinical Impression  Pt presented supine in bed with HOB elevated, awake and willing to participate in therapy session. Prior to admission, pt reported that she was independent with all functional mobility and ADLs. Pt lives with her mother in a single level home with a level entry. At the time of evaluation, pt overall at modified independent to supervision level with all functional mobility. She reported that she feels she is at her baseline in regards to mobility. No further acute or follow-up PT services indicated at this time. PT signing off.     Follow Up Recommendations No PT follow up    Equipment Recommendations  None recommended by PT    Recommendations for Other Services       Precautions / Restrictions Precautions Precautions: Fall Restrictions Weight Bearing Restrictions: No      Mobility  Bed Mobility Overal bed mobility: Independent                  Transfers Overall transfer level: Independent                  Ambulation/Gait Ambulation/Gait assistance: Supervision Gait Distance (Feet): 100 Feet Assistive device: IV Pole Gait Pattern/deviations: Step-through pattern Gait velocity: decreased   General Gait Details: no instability or LOB, pt pushing IV pole but not reliant on it for support  Stairs            Wheelchair Mobility    Modified Rankin (Stroke Patients Only)       Balance Overall balance assessment: Independent                                           Pertinent Vitals/Pain Pain Assessment: Faces Faces Pain Scale: Hurts little more Pain Location: stomach Pain  Descriptors / Indicators: Guarding Pain Intervention(s): Monitored during session;Repositioned    Home Living Family/patient expects to be discharged to:: Private residence Living Arrangements: Parent Available Help at Discharge: Family;Available 24 hours/day   Home Access: Level entry     Home Layout: One level Home Equipment: Shower seat      Prior Function Level of Independence: Independent         Comments: works for [employer]        Extremity/Trunk Assessment   Upper Extremity Assessment Upper Extremity Assessment: Overall WFL for tasks assessed    Lower Extremity Assessment Lower Extremity Assessment: Overall WFL for tasks assessed    Cervical / Trunk Assessment Cervical / Trunk Assessment: Normal  Communication   Communication: No difficulties  Cognition Arousal/Alertness: Awake/alert Behavior During Therapy: WFL for tasks assessed/performed Overall Cognitive Status: Within Functional Limits for tasks assessed                                        General Comments      Exercises     Assessment/Plan    PT Assessment Patent does not need any further PT services  PT Problem List         PT Treatment Interventions      PT Goals (Current goals can be found in the Care Plan section)  Acute Rehab PT Goals Patient Stated Goal: "home tomorrow" PT Goal Formulation: All assessment and education complete, DC therapy    Frequency     Barriers to discharge        Co-evaluation               AM-PAC PT "6 Clicks" Mobility  Outcome Measure Help needed turning from your back to your side while in a flat bed without using bedrails?: None Help needed moving from lying on your back to sitting on the side of a flat bed without using bedrails?: None Help needed moving to and from a bed to a chair (including a wheelchair)?: None Help needed standing up from a chair using your arms (e.g., wheelchair or bedside  chair)?: None Help needed to walk in hospital room?: None Help needed climbing 3-5 steps with a railing? : None 6 Click Score: 24    End of Session   Activity Tolerance: Patient tolerated treatment well Patient left: in bed;with call bell/phone within reach Nurse Communication: Mobility status PT Visit Diagnosis: Other abnormalities of gait and mobility (R26.89)    Time: 8099-8338 PT Time Calculation (min) (ACUTE ONLY): 12 min   Charges:   PT Evaluation $PT Eval Low Complexity: 1 Low          Ginette Pitman, PT, DPT  Acute Rehabilitation Services Office [phone]    Alessandra Bevels Bladimir Auman 11/26/2019, 3:04 PM

## 2019-11-26 NOTE — Progress Notes (Signed)
Message sent to Dr. Antionette Char on call for patient complaint of abdominal pain and vomited moderate amount of undigested food. Stating that pain medication and Zofran is not working.

## 2019-11-26 NOTE — Progress Notes (Signed)
PROGRESS NOTE    HARLEM THRESHER  EHU:[id number] DOB: 10-29-2000 DOA: 11/24/2019 PCP: Fleet Contras, MD    Brief Narrative:  Mrs. Nancy [name] was admitted to the hospital with a working diagnosis of diabetes ketoacidosis.  19 year old female past medical history for type 1 diabetes mellitus, Candida esophagitis, gastritis, gastroparesis, hypertension, dyslipidemia, polysubstance abuse, seizure disorder, conversion disorder, anxiety, and depression.  Patient reported 4 days of intractable nausea and vomiting, unable to take any p.o.  Symptoms associated with epigastric abdominal pain, and chills.  Positive polydipsia and polyuria.  Her symptoms were refractive to outpatient management in the ED with intravenous Haldol, lorazepam and pantoprazole.  While on radiology she had a seizure-like activity.  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  On her initial physical examination blood pressure 127/62, heart rate 106, temperature 98.2, respirate 24, oxygen saturation 100%, her lungs are clear to auscultation bilaterally, heart S1-S2, present and rhythmic, her abdomen had diffuse tenderness to palpation, more in the epigastric region, no lower extremity edema. Sodium 137, potassium 3.7, chloride 101, bicarb 18, glucose 395, BUN 7, creatinine 0.73, anion gap 18, white count 9.1, hemoglobin 13.6, hematocrit 42.2, platelets 393.  Toxicology screen positive for opiates and tetrahydrocannabinol. Chest film with no infiltrates.  EKG, 115 bpm, normal axis, normal intervals, sinus rhythm, J-point elevation V1-V3, no ST segment or T wave changes.  Ketoacidosis has resolved but patient continue with nausea and vomiting, not able to tolerate po.    She reports being on opiates at home, I called her pharmacy [facility] and no record of narcotic medications.    Assessment & Plan:   Principal Problem:   DKA, type 1 (HCC) Active Problems:   Hypertension   Non compliance with medical treatment   Chronic  abdominal pain   Depression   MDD (major depressive disorder), recurrent severe, without psychosis (HCC)   Pseudoseizures (HCC)   Diabetic gastroparesis (HCC)   Polysubstance abuse (HCC)   Gastritis    1. Diabetic ketoacidosis/ T1DM (uncontrolled)/ dyslpidemia. anion gap this am is 8, fasting glucose 287.  Patient with nausea and vomiting, still not fully tolerating po well.    Continue with current insulin regimen with sq insulin with 45 units, pre-meal 4 units and insulin sliding scale. Close glucose monitoring.   On atorvastatin.   2. Intractable nausea and vomiting. CT of the abdomen and pelvis on 09/21 with no acute findings. Patient has chronic abdominal pian and hx of gastroparesis.  Continue with metoclopramide before meals, pantoprazole and sucralfate. Pain control with morphine and oxycodone, continue with IV fluids while patient not able to tolerate po fully.   Out of bed to chair tid with meals, PT and OT evaluation, ambulate in the hallway.   3. Polysubstance abuse. Toxicology screen positive for THC and opiates. No clinical signs of acute withdrawal.,   4. Depression, anxiety. On mirtazapine, quetiapine, lamotrigine, hydroxyzine, buspirone and aripiprazole with good toleration.   5. Hypokalemia. Stable renal function with serum cr at 0,98, K is 3,5 and bicarbonate is 25.  Continue K correction with Kcl 40 meq, follow up on renal function in am.   6. HTN. Continue blood pressure control with propranolol.   Patient continue to be at high risk for worsening nausea, vomiting and recurrent ketoacidosis.   Status is: Inpatient  Remains inpatient appropriate because:IV treatments appropriate due to intensity of illness or inability to take PO   Dispo:  Patient From: Home  Planned Disposition: Home  Expected discharge date: 11/27/19  Medically stable for discharge: No    DVT prophylaxis: Enoxaparin   Code Status:   full  Family Communication:  No  family at the bedside      Nutrition Status: Nutrition Problem: Inadequate oral intake Etiology: nausea, vomiting Signs/Symptoms: per patient/family report Interventions: Glucerna shake        Subjective: Patient with persistent nausea and vomiting, not fully tolerating po well, continue to have abdominal pain. Reports mild improvement from yesterday but not yet back to baseline. No bowel movement in the hospital,.   Objective: Vitals:   11/25/19 1452 11/25/19 1453 11/25/19 2124 11/26/19 0517  BP: (!) 158/110  124/87 113/79  Pulse: 95  85 83  Resp: 18 18 18 20   Temp: 98 F (36.7 C)  98.1 F (36.7 C) 98.2 F (36.8 C)  TempSrc: Oral  Oral Oral  SpO2: 100%  100% 100%  Weight:      Height:        Intake/Output Summary (Last 24 hours) at 11/26/2019 0951 Last data filed at 11/26/2019 11/28/2019 Gross per 24 hour  Intake 1348.22 ml  Output --  Net 1348.22 ml   Filed Weights   11/24/19 1501 11/25/19 0000  Weight: 95.3 kg 87.3 kg    Examination:   General: Not in pain or dyspnea, deconditioned  Neurology: Awake and alert, non focal  E ENT: no pallor, no icterus, oral mucosa moist Cardiovascular: No JVD. S1-S2 present, rhythmic, no gallops, rubs, or murmurs. No lower extremity edema. Pulmonary: positive breath sounds bilaterally, with no wheezing, rhonchi or rales. Gastrointestinal. Abdomen mild distended and tender to deep palpation, no rebound or guarding  Skin. No rashes Musculoskeletal: no joint deformities     Data Reviewed: I have personally reviewed following labs and imaging studies  CBC: Recent Labs  Lab 11/21/19 0737 11/21/19 0802 11/24/19 1515 11/24/19 1634 11/24/19 1709  WBC 11.2*  --  9.1  --   --   NEUTROABS 8.7*  --   --   --   --   HGB 13.9 15.3* 13.6 15.0 14.6  HCT 43.1 45.0 42.2 44.0 43.0  MCV 79.5*  --  78.6*  --   --   PLT 350  --  393  --   --    Basic Metabolic Panel: Recent Labs  Lab 11/21/19 0737 11/21/19 0802 11/24/19 1515  11/24/19 1515 11/24/19 1634 11/24/19 1709 11/24/19 1800 11/25/19 0042 11/25/19 0459 11/26/19 0342  NA 132*   < > 137   < > 138 137  --  138 137 136  K 5.0   < > 3.7   < > 4.0 4.0  --  3.2* 3.1* 3.5  CL 93*   < > 101  --  103  --   --  102 104 103  CO2 19*  --  18*  --   --   --   --  22 22 25   GLUCOSE 411*   < > 395*  --  390*  --   --  129* 220* 287*  BUN 10   < > 7  --  7  --   --  7 7 7   CREATININE 0.80   < > 0.73  --  0.40*  --   --  0.61 0.58 0.98  CALCIUM 9.4  --  10.1  --   --   --   --  9.4 8.7* 9.0  MG  --   --   --   --   --   --  1.8  --   --   --    < > = values in this interval not displayed.   GFR: Estimated Creatinine Clearance: 95.2 mL/min (by C-G formula based on SCr of 0.98 mg/dL). Liver Function Tests: Recent Labs  Lab 11/21/19 0737 11/24/19 1800  AST 24 12*  ALT 13 13  ALKPHOS 85 80  BILITOT 1.8* 0.7  PROT 7.4 6.6  ALBUMIN 4.2 3.8   Recent Labs  Lab 11/21/19 0737 11/24/19 1800  LIPASE 24 25   No results for input(s): AMMONIA in the last 168 hours. Coagulation Profile: No results for input(s): INR, PROTIME in the last 168 hours. Cardiac Enzymes: No results for input(s): CKTOTAL, CKMB, CKMBINDEX, TROPONINI in the last 168 hours. BNP (last 3 results) No results for input(s): PROBNP in the last 8760 hours. HbA1C: Recent Labs    11/25/19 0042  HGBA1C 11.7*   CBG: Recent Labs  Lab 11/25/19 0713 11/25/19 1133 11/25/19 1632 11/25/19 2113 11/26/19 0730  GLUCAP 287* 233* 194* 189* 227*   Lipid Profile: No results for input(s): CHOL, HDL, LDLCALC, TRIG, CHOLHDL, LDLDIRECT in the last 72 hours. Thyroid Function Tests: No results for input(s): TSH, T4TOTAL, FREET4, T3FREE, THYROIDAB in the last 72 hours. Anemia Panel: No results for input(s): VITAMINB12, FOLATE, FERRITIN, TIBC, IRON, RETICCTPCT in the last 72 hours.    Radiology Studies: I have reviewed all of the imaging during this hospital visit personally     Scheduled Meds: .  ARIPiprazole  10 mg Oral Daily  . atorvastatin  20 mg Oral q1800  . busPIRone  10 mg Oral BID  . enoxaparin (LOVENOX) injection  40 mg Subcutaneous Daily  . famotidine  40 mg Oral BID  . feeding supplement  237 mL Oral BID BM  . hydrOXYzine  50 mg Oral BID  . insulin aspart  0-15 Units Subcutaneous TID WC  . insulin aspart  0-5 Units Subcutaneous QHS  . insulin aspart  4 Units Subcutaneous TID WC  . insulin glargine  45 Units Subcutaneous QHS  . lamoTRIgine  25 mg Oral BID  . metoCLOPramide  5 mg Oral TID  . mirtazapine  7.5 mg Oral QHS  . multivitamin with minerals  1 tablet Oral Daily  . olopatadine  1 drop Both Eyes BID  . pantoprazole  40 mg Oral BID  . propranolol  20 mg Oral BID  . QUEtiapine  300 mg Oral QHS  . QUEtiapine  50 mg Oral Daily  . sucralfate  1 g Oral TID WC & HS  . vitamin B-12  500 mcg Oral Daily   Continuous Infusions: . lactated ringers 75 mL/hr at 11/26/19 0306     LOS: 1 day        Sanora Cunanan Annett Gula, MD

## 2019-11-26 NOTE — Plan of Care (Signed)
°  Problem: Education: Goal: Ability to describe self-care measures that may prevent or decrease complications (Diabetes Survival Skills Education) will improve Outcome: Progressing Goal: Individualized Educational Video(s) Outcome: Progressing   Problem: Coping: Goal: Ability to adjust to condition or change in health will improve Outcome: Progressing   Problem: Fluid Volume: Goal: Ability to maintain a balanced intake and output will improve Outcome: Progressing   Problem: Health Behavior/Discharge Planning: Goal: Ability to identify and utilize available resources and services will improve Outcome: Progressing Goal: Ability to manage health-related needs will improve Outcome: Progressing   Problem: Metabolic: Goal: Ability to maintain appropriate glucose levels will improve Outcome: Progressing   Problem: Nutritional: Goal: Maintenance of adequate nutrition will improve Outcome: Progressing Goal: Progress toward achieving an optimal weight will improve Outcome: Progressing   Problem: Skin Integrity: Goal: Risk for impaired skin integrity will decrease Outcome: Progressing   Problem: Tissue Perfusion: Goal: Adequacy of tissue perfusion will improve Outcome: Progressing   Problem: Education: Goal: Expressions of having a comfortable level of knowledge regarding the disease process will increase Outcome: Progressing   Problem: Coping: Goal: Ability to adjust to condition or change in health will improve Outcome: Progressing Goal: Ability to identify appropriate support needs will improve Outcome: Progressing   Problem: Health Behavior/Discharge Planning: Goal: Compliance with prescribed medication regimen will improve Outcome: Progressing   Problem: Medication: Goal: Risk for medication side effects will decrease Outcome: Progressing   Problem: Clinical Measurements: Goal: Complications related to the disease process, condition or treatment will be avoided or  minimized Outcome: Progressing Goal: Diagnostic test results will improve Outcome: Progressing   Problem: Safety: Goal: Verbalization of understanding the information provided will improve Outcome: Progressing   Problem: Self-Concept: Goal: Level of anxiety will decrease Outcome: Progressing Goal: Ability to verbalize feelings about condition will improve Outcome: Progressing   

## 2019-11-27 DIAGNOSIS — R109 Unspecified abdominal pain: Secondary | ICD-10-CM | POA: Diagnosis not present

## 2019-11-27 DIAGNOSIS — E1010 Type 1 diabetes mellitus with ketoacidosis without coma: Secondary | ICD-10-CM | POA: Diagnosis not present

## 2019-11-27 DIAGNOSIS — K2970 Gastritis, unspecified, without bleeding: Secondary | ICD-10-CM | POA: Diagnosis not present

## 2019-11-27 DIAGNOSIS — F339 Major depressive disorder, recurrent, unspecified: Secondary | ICD-10-CM | POA: Diagnosis not present

## 2019-11-27 LAB — BASIC METABOLIC PANEL
Anion gap: 8 (ref 5–15)
BUN: 6 mg/dL (ref 6–20)
CO2: 27 mmol/L (ref 22–32)
Calcium: 9.1 mg/dL (ref 8.9–10.3)
Chloride: 101 mmol/L (ref 98–111)
Creatinine, Ser: 0.5 mg/dL (ref 0.44–1.00)
GFR, Estimated: >60 mL/min (ref >60)
Glucose, Bld: 157 mg/dL — ABNORMAL HIGH (ref 70–99)
Potassium: 3.5 mmol/L (ref 3.5–5.1)
Sodium: 136 mmol/L (ref 135–145)

## 2019-11-27 LAB — MAGNESIUM: Magnesium: 1.7 mg/dL (ref 1.7–2.4)

## 2019-11-27 LAB — GLUCOSE, CAPILLARY
Glucose-Capillary: 130 mg/dL — ABNORMAL HIGH (ref 70–99)
Glucose-Capillary: 104 mg/dL — ABNORMAL HIGH (ref 70–99)

## 2019-11-27 MED ORDER — POLYETHYLENE GLYCOL 3350 17 G PO PACK
17.0000 g | PACK | Freq: Two times a day (BID) | ORAL | Status: DC
  Administered 2019-11-27: 17 g via ORAL
  Filled 2019-11-27: qty 1

## 2019-11-27 MED ORDER — ENSURE ENLIVE PO LIQD: 237.0000 mL | Freq: Two times a day (BID) | ORAL | 0 refills | Status: AC

## 2019-11-27 NOTE — Discharge Summary (Signed)
Physician Discharge Summary  Nancy [name] JFH:[id number] DOB: February 28, 2000 DOA: 11/24/2019  PCP: Fleet Contras, MD  Admit date: 11/24/2019 Discharge date: 11/27/2019  Admitted From: Home  Disposition:  Home   Recommendations for Outpatient Follow-up and new medication changes:  1. Follow up with Dr. Concepcion Elk in 7 days.  2. Continue as needed antiemetics.  3. Add ensure for nutritional supplements.   Home Health: no   Equipment/Devices: no    Discharge Condition: stable  CODE STATUS: full  Diet recommendation: heart healthy and diabetic prudent, small portions.   Brief/Interim Summary: Mrs. Freund admitted to the hospital with a working diagnosis of diabetes ketoacidosis, complicated with gastroparesis and intractable nausea and vomiting.  19 year old female past medical history for type 1 diabetes mellitus, Candida esophagitis, gastritis, gastroparesis, hypertension, dyslipidemia,polysubstance abuse, seizure disorder, conversion disorder, anxiety, and depression.  Patient reported 4 days of intractable nausea and vomiting, unable to take any p.o. Symptoms associated with epigastric abdominal pain, and chills. Positive polydipsia and polyuria. Her symptoms were refractive to outpatient management in the ED with intravenous Haldol, lorazepam and pantoprazole. While on radiologyshe had a seizure-like activity. 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 On her initial physical examination blood pressure 127/62, heart rate 106, temperature 98.2, respirate 24, oxygen saturation 100%, her lungs were clear to auscultation bilaterally, heart S1-S2, present and rhythmic, her abdomen had diffuse tenderness to palpation, more in the epigastric region, no lower extremity edema. Sodium 137, potassium 3.7, chloride 101, bicarb 18, glucose 395, BUN 7, creatinine 0.73, anion gap 18, white count 9.1, hemoglobin 13.6, hematocrit 42.2, platelets 393.Toxicology screen positive for  opiates and tetrahydrocannabinol. Chest film with no infiltrates. EKG, 115 bpm, normal axis, normal intervals, sinus rhythm, J-point elevation V1-V3, no ST segment or T wave changes.  Patient received IV insulin, IV fluids, antiacids, as needed antiemetics and analgesics.     Ketoacidosis resolved but patient continue with nausea and vomiting, not able to tolerate po.    She reported being on opiates at home, I called her pharmacy [facility] and no record of narcotic medications.   Her symptoms slowly improved, and patient was deemed sable for discharge home with outpatient follow up.   1.  Diabetes ketoacidosis, type 1 diabetes mellitus, dyslipidemia.  Patient responded well to medical therapy with intravenous insulin, she was successfully transitioned to regimen with good toleration. At discharge patient is able to tolerate p.o. diet adequately.  Her fasting glucose at discharge is 157.  Continue insulin regimen with Lantus 70 units at bedtime and insulin sliding scale. Continue atorvastatin.  2.  Acute on chronic gastroparesis.  Patient had a CT of the abdomen September 2021 with no acute findings.  Patient was placed on supportive medical therapy including intravenous fluids, antiacids, antiemetics and analgesics.  Her p.o. intake has improved, she continued to have occasional vomiting but able to maintain appropriate nutrition.  She will have close follow-up as an outpatient.  Patient is on multiple psychotropic agents.  History of conversion disorder.  Recommend outpatient follow-up with psychiatry.  3.  Polysubstance abuse.  Her toxicology screen was positive for THC and opiates.  She had no significant withdrawals during her hospitalization.  4.  Depression/anxiety.  Patient was continued on mirtazapine, quetiapine, lamotrigine, hydroxyzine, buspirone and aripiprazole.   5.  Hypokalemia.  Patient received potassium chloride for potassium correction, her kidney function remained  stable.   Discharge Diagnoses:  Principal Problem:   DKA, type 1 (HCC) Active Problems:   Hypertension  Non compliance with medical treatment   Chronic abdominal pain   Depression   MDD (major depressive disorder), recurrent severe, without psychosis (HCC)   Pseudoseizures (HCC)   Diabetic gastroparesis (HCC)   Polysubstance abuse (HCC)   Gastritis    Discharge Instructions   Allergies as of 11/27/2019      Reactions   Tomato Anaphylaxis   Ibuprofen Other (See Comments)   GI MD said to not take this anymore   Oatmeal Rash      Medication List    TAKE these medications   Abilify 10 MG tablet Generic drug: ARIPiprazole Take 10 mg by mouth in the morning and at bedtime.   atorvastatin 20 MG tablet Commonly known as: LIPITOR Take 1 tablet (20 mg total) by mouth daily at 6 PM.   busPIRone 10 MG tablet Commonly known as: BUSPAR Take 10 mg by mouth 2 (two) times daily.   dicyclomine 10 MG capsule Commonly known as: BENTYL Take 1 capsule (10 mg total) by mouth 4 (four) times daily as needed for spasms.   famotidine 40 MG tablet Commonly known as: PEPCID Take 40 mg by mouth 2 (two) times daily.   FDgard 25-20.75 MG Caps Generic drug: [place] Oil-Levomenthol Take 2 capsules by mouth as needed. Take as directed as needed   feeding supplement Liqd Take 237 mLs by mouth 2 (two) times daily between meals.   hydrOXYzine 50 MG tablet Commonly known as: ATARAX/VISTARIL Take 50 mg by mouth 2 (two) times daily.   lamoTRIgine 25 MG tablet Commonly known as: LAMICTAL Take 2 tablets (50 mg total) by mouth 2 (two) times daily. For mood stabilization What changed:   how much to take  when to take this   Lantus SoloStar 100 UNIT/ML Solostar Pen Generic drug: insulin glargine Inject 70 Units into the skin at bedtime.   lidocaine 5 % Commonly known as: LIDODERM Place 1 patch onto the skin daily as needed (pain). Remove & Discard patch within 12 hours or as  directed by MD   lubiprostone 24 MCG capsule Commonly known as: AMITIZA Take 1 capsule (24 mcg total) by mouth 2 (two) times daily with a meal.   metoCLOPramide 5 MG tablet Commonly known as: Reglan Take 1 tablet (5 mg total) by mouth 3 (three) times daily. What changed:   when to take this  reasons to take this   mirtazapine 7.5 MG tablet Commonly known as: REMERON Take 1 tablet (7.5 mg total) by mouth at bedtime. For depression/sleep   multivitamin with minerals Tabs tablet Take 1 tablet by mouth daily.   NovoLOG FlexPen 100 UNIT/ML FlexPen Generic drug: insulin aspart Inject 15 Units into the skin 2 (two) times daily with a meal. Only takes once a day bc she only eats once a day   pantoprazole 40 MG tablet Commonly known as: PROTONIX Take 1 tablet (40 mg total) by mouth 2 (two) times daily. For acid reflux What changed:   when to take this  reasons to take this   Pazeo 0.7 % Soln Generic drug: Olopatadine HCl Place 3 drops into both eyes in the morning and at bedtime.   polyethylene glycol 17 g packet Commonly known as: MIRALAX / GLYCOLAX Take 17 g by mouth daily.   prochlorperazine 25 MG suppository Commonly known as: COMPAZINE Place 1 suppository (25 mg total) rectally every 12 (twelve) hours as needed for nausea or vomiting.   propranolol 20 MG tablet Commonly known as: INDERAL Take 1 tablet (20 mg  total) by mouth 2 (two) times daily. For anxiety/HTN   QUEtiapine 50 MG tablet Commonly known as: SEROQUEL Take 50 mg by mouth daily.   QUEtiapine 100 MG tablet Commonly known as: SEROQUEL Take 300 mg by mouth at bedtime.   sucralfate 1 GM/10ML suspension Commonly known as: CARAFATE Take 1 g by mouth 4 (four) times daily -  with meals and at bedtime.   topiramate 25 MG tablet Commonly known as: TOPAMAX Take 1 tablet (25 mg total) by mouth 2 (two) times daily. For seizure activities   VITAMIN B-12 PO Take 1 tablet by mouth daily. Chew one gummy  daily   Voltaren 1 % Gel Generic drug: diclofenac Sodium Apply 1 application topically 2 (two) times daily as needed (chest pains).       Allergies  Allergen Reactions  . Tomato Anaphylaxis  . Ibuprofen Other (See Comments)    GI MD said to not take this anymore  . Oatmeal Rash       Procedures/Studies: DG Chest 2 View  Result Date: 11/13/2019 CLINICAL DATA:  Chest pain for 2 hours, multiple breakthrough seizures today, shortness of breath EXAM: CHEST - 2 VIEW COMPARISON:  10/19/2019 FINDINGS: Normal heart size, mediastinal contours, and pulmonary vascularity. Lungs clear. No pleural effusion or pneumothorax. Bones unremarkable. IMPRESSION: Normal exam. Electronically Signed   By: Ulyses Southward M.D.   On: 11/13/2019 16:20   DG Chest Portable 1 View  Result Date: 11/24/2019 CLINICAL DATA:  19 year old female with abdominal pain and seizure. EXAM: PORTABLE CHEST 1 VIEW COMPARISON:  Chest radiograph dated 11/13/2019. FINDINGS: The heart size and mediastinal contours are within normal limits. Both lungs are clear. The visualized skeletal structures are unremarkable. IMPRESSION: No active disease. Electronically Signed   By: Elgie Collard M.D.   On: 11/24/2019 17:28   DG Abd 2 Views  Result Date: 11/24/2019 CLINICAL DATA:  Intractable vomiting EXAM: ABDOMEN - 2 VIEW COMPARISON:  None. FINDINGS: The bowel gas pattern is normal. There is no evidence of free air. No radio-opaque calculi or other significant radiographic abnormality is seen. IMPRESSION: Negative. Electronically Signed   By: Charlett Nose M.D.   On: 11/24/2019 20:10   DG Abd Acute W/Chest  Result Date: 11/21/2019 CLINICAL DATA:  19 year old with abdominal pain and vomiting. EXAM: DG ABDOMEN ACUTE WITH 1 VIEW CHEST COMPARISON:  Chest radiograph 11/13/2019 FINDINGS: Both lungs are clear. Heart size is normal. Negative for free air. Nonobstructive bowel gas pattern. Small to moderate amount of stool in the abdomen. No large  abdominal or pelvic calcifications. Bone structures are unremarkable. IMPRESSION: Negative abdominal radiographs.  No acute cardiopulmonary disease. Electronically Signed   By: Richarda Overlie M.D.   On: 11/21/2019 08:49       Subjective: Patient is feeling better, abdominal pain, nausea and vomiting are improving, no chest pain or dyspnea.   Discharge Exam: Vitals:   11/26/19 2145 11/27/19 0524  BP: (!) 149/94 128/90  Pulse: 73 89  Resp: 18 18  Temp: 98.2 F (36.8 C) 98.4 F (36.9 C)  SpO2: 98% 100%   Vitals:   11/26/19 0517 11/26/19 1407 11/26/19 2145 11/27/19 0524  BP: 113/79 (!) 158/110 (!) 149/94 128/90  Pulse: 83 92 73 89  Resp: 20 18 18 18   Temp: 98.2 F (36.8 C) 98.1 F (36.7 C) 98.2 F (36.8 C) 98.4 F (36.9 C)  TempSrc: Oral  Oral Oral  SpO2: 100% 100% 98% 100%  Weight:      Height:  General: Not in pain or dyspnea.  Neurology: Awake and alert, non focal  E ENT: no pallor, no icterus, oral mucosa moist Cardiovascular: No JVD. S1-S2 present, rhythmic, no gallops, rubs, or murmurs. No lower extremity edema. Pulmonary: vesicular breath sounds bilaterally, adequate air movement, no wheezing, rhonchi or rales. Gastrointestinal. Abdomen soft with no guarding, non tender to superficial palpation Skin. No rashes Musculoskeletal: no joint deformities   The results of significant diagnostics from this hospitalization (including imaging, microbiology, ancillary and laboratory) are listed below for reference.     Microbiology: Recent Results (from the past 240 hour(s))  Respiratory Panel by RT PCR (Flu A&B, Covid) - Nasopharyngeal Swab     Status: None   Collection Time: 11/25/19  9:30 AM   Specimen: Nasopharyngeal Swab; Nasopharyngeal(NP) swabs in vial transport medium  Result Value Ref Range Status   SARS Coronavirus 2 by RT PCR NEGATIVE NEGATIVE Final    Comment: (NOTE) SARS-CoV-2 target nucleic acids are NOT DETECTED.  The SARS-CoV-2 RNA is generally  detectable in upper respiratoy specimens during the acute phase of infection. The lowest concentration of SARS-CoV-2 viral copies this assay can detect is 131 copies/mL. A negative result does not preclude SARS-Cov-2 infection and should not be used as the sole basis for treatment or other patient management decisions. A negative result may occur with  improper specimen collection/handling, submission of specimen other than nasopharyngeal swab, presence of viral mutation(s) within the areas targeted by this assay, and inadequate number of viral copies (<131 copies/mL). A negative result must be combined with clinical observations, patient history, and epidemiological information. The expected result is Negative.  Fact Sheet for Patients:  https://www.moore.com/  Fact Sheet for Healthcare Providers:  https://www.young.biz/  This test is no t yet approved or cleared by the [place] FDA and  has been authorized for detection and/or diagnosis of SARS-CoV-2 by FDA under an Emergency Use Authorization (EUA). This EUA will remain  in effect (meaning this test can be used) for the duration of the COVID-19 declaration under Section 564(b)(1) of the Act, 21 U.S.C. section 360bbb-3(b)(1), unless the authorization is terminated or revoked sooner.     Influenza A by PCR NEGATIVE NEGATIVE Final   Influenza B by PCR NEGATIVE NEGATIVE Final    Comment: (NOTE) The Xpert Xpress SARS-CoV-2/FLU/RSV assay is intended as an aid in  the diagnosis of influenza from Nasopharyngeal swab specimens and  should not be used as a sole basis for treatment. Nasal washings and  aspirates are unacceptable for Xpert Xpress SARS-CoV-2/FLU/RSV  testing.  Fact Sheet for Patients: https://www.moore.com/  Fact Sheet for Healthcare Providers: https://www.young.biz/  This test is not yet approved or cleared by the [place] FDA and   has been authorized for detection and/or diagnosis of SARS-CoV-2 by  FDA under an Emergency Use Authorization (EUA). This EUA will remain  in effect (meaning this test can be used) for the duration of the  Covid-19 declaration under Section 564(b)(1) of the Act, 21  U.S.C. section 360bbb-3(b)(1), unless the authorization is  terminated or revoked. Performed at [facility], [address], [place], [place] [address]      Labs: BNP (last 3 results) No results for input(s): BNP in the last 8760 hours. Basic Metabolic Panel: Recent Labs  Lab 11/24/19 1515 11/24/19 1515 11/24/19 1634 11/24/19 1634 11/24/19 1709 11/24/19 1800 11/25/19 0042 11/25/19 0459 11/26/19 0342 11/27/19 0123  NA 137   < > 138   < > 137  --  138 137 136 136  K 3.7   < > 4.0   < > 4.0  --  3.2* 3.1* 3.5 3.5  CL 101   < > 103  --   --   --  102 104 103 101  CO2 18*  --   --   --   --   --  22 22 25 27   GLUCOSE 395*   < > 390*  --   --   --  129* 220* 287* 157*  BUN 7   < > 7  --   --   --  7 7 7 6   CREATININE 0.73   < > 0.40*  --   --   --  0.61 0.58 0.98 0.50  CALCIUM 10.1  --   --   --   --   --  9.4 8.7* 9.0 9.1  MG  --   --   --   --   --  1.8  --   --   --  1.7   < > = values in this interval not displayed.   Liver Function Tests: Recent Labs  Lab 11/21/19 0737 11/24/19 1800  AST 24 12*  ALT 13 13  ALKPHOS 85 80  BILITOT 1.8* 0.7  PROT 7.4 6.6  ALBUMIN 4.2 3.8   Recent Labs  Lab 11/21/19 0737 11/24/19 1800  LIPASE 24 25   No results for input(s): AMMONIA in the last 168 hours. CBC: Recent Labs  Lab 11/21/19 0737 11/21/19 0802 11/24/19 1515 11/24/19 1634 11/24/19 1709  WBC 11.2*  --  9.1  --   --   NEUTROABS 8.7*  --   --   --   --   HGB 13.9 15.3* 13.6 15.0 14.6  HCT 43.1 45.0 42.2 44.0 43.0  MCV 79.5*  --  78.6*  --   --   PLT 350  --  393  --   --    Cardiac Enzymes: No results for input(s): CKTOTAL, CKMB, CKMBINDEX, TROPONINI in the last 168 hours. BNP: Invalid  input(s): POCBNP CBG: Recent Labs  Lab 11/26/19 0730 11/26/19 1206 11/26/19 1602 11/26/19 2134 11/27/19 0730  GLUCAP 227* 128* 167* 166* 130*   D-Dimer No results for input(s): DDIMER in the last 72 hours. Hgb A1c Recent Labs    11/25/19 0042  HGBA1C 11.7*   Lipid Profile No results for input(s): CHOL, HDL, LDLCALC, TRIG, CHOLHDL, LDLDIRECT in the last 72 hours. Thyroid function studies No results for input(s): TSH, T4TOTAL, T3FREE, THYROIDAB in the last 72 hours.  Invalid input(s): FREET3 Anemia work up No results for input(s): VITAMINB12, FOLATE, FERRITIN, TIBC, IRON, RETICCTPCT in the last 72 hours. Urinalysis    Component Value Date/Time   COLORURINE STRAW (A) 11/21/2019 1130   APPEARANCEUR CLEAR 11/21/2019 1130   LABSPEC 1.035 (H) 11/21/2019 1130   PHURINE 6.0 11/21/2019 1130   GLUCOSEU >=500 (A) 11/21/2019 1130   HGBUR NEGATIVE 11/21/2019 1130   BILIRUBINUR NEGATIVE 11/21/2019 1130   BILIRUBINUR negative 07/13/2018 1651   KETONESUR 80 (A) 11/21/2019 1130   PROTEINUR NEGATIVE 11/21/2019 1130   UROBILINOGEN 0.2 07/13/2018 1651   UROBILINOGEN 1.0 07/09/2017 1324   NITRITE NEGATIVE 11/21/2019 1130   LEUKOCYTESUR NEGATIVE 11/21/2019 1130   Sepsis Labs Invalid input(s): PROCALCITONIN,  WBC,  LACTICIDVEN Microbiology Recent Results (from the past 240 hour(s))  Respiratory Panel by RT PCR (Flu A&B, Covid) - Nasopharyngeal Swab     Status: None   Collection Time: 11/25/19  9:30 AM   Specimen: Nasopharyngeal Swab; Nasopharyngeal(NP) swabs in vial transport medium  Result Value Ref Range Status   SARS Coronavirus 2 by RT PCR NEGATIVE NEGATIVE Final    Comment: (NOTE) SARS-CoV-2 target nucleic acids are NOT DETECTED.  The SARS-CoV-2 RNA is generally detectable in upper respiratoy specimens during the acute phase of infection. The lowest concentration of SARS-CoV-2 viral copies this assay can detect is 131 copies/mL. A negative result does not preclude  SARS-Cov-2 infection and should not be used as the sole basis for treatment or other patient management decisions. A negative result may occur with  improper specimen collection/handling, submission of specimen other than nasopharyngeal swab, presence of viral mutation(s) within the areas targeted by this assay, and inadequate number of viral copies (<131 copies/mL). A negative result must be combined with clinical observations, patient history, and epidemiological information. The expected result is Negative.  Fact Sheet for Patients:  https://www.moore.com/  Fact Sheet for Healthcare Providers:  https://www.young.biz/  This test is no t yet approved or cleared by the [place] FDA and  has been authorized for detection and/or diagnosis of SARS-CoV-2 by FDA under an Emergency Use Authorization (EUA). This EUA will remain  in effect (meaning this test can be used) for the duration of the COVID-19 declaration under Section 564(b)(1) of the Act, 21 U.S.C. section 360bbb-3(b)(1), unless the authorization is terminated or revoked sooner.     Influenza A by PCR NEGATIVE NEGATIVE Final   Influenza B by PCR NEGATIVE NEGATIVE Final    Comment: (NOTE) The Xpert Xpress SARS-CoV-2/FLU/RSV assay is intended as an aid in  the diagnosis of influenza from Nasopharyngeal swab specimens and  should not be used as a sole basis for treatment. Nasal washings and  aspirates are unacceptable for Xpert Xpress SARS-CoV-2/FLU/RSV  testing.  Fact Sheet for Patients: https://www.moore.com/  Fact Sheet for Healthcare Providers: https://www.young.biz/  This test is not yet approved or cleared by the [place] FDA and  has been authorized for detection and/or diagnosis of SARS-CoV-2 by  FDA under an Emergency Use Authorization (EUA). This EUA will remain  in effect (meaning this test can be used) for the duration of the   Covid-19 declaration under Section 564(b)(1) of the Act, 21  U.S.C. section 360bbb-3(b)(1), unless the authorization is  terminated or revoked. Performed at [facility], [address], [place], [place] [address]      Time coordinating discharge: 45 minutes  SIGNED:   Coralie Keens, MD  [facility] 11/27/2019, 11:27 AM

## 2019-11-30 IMAGING — CT CT HEAD W/O CM
2 series · 15 of 30 positions shown, 17 images · non-contrast
Comparison: CT brain 10/30/2018

CLINICAL DATA: Syncope, altered LOC

EXAM:
CT HEAD WITHOUT CONTRAST
TECHNIQUE: Contiguous axial images were obtained from the base of the skull
through the vertex without intravenous contrast.

[Series 4: head wo · axial · 0.32mm/px · z∈[-116,-1]mm · 7 of 33 slices shown, 9 images]
[im 5/33  brain]
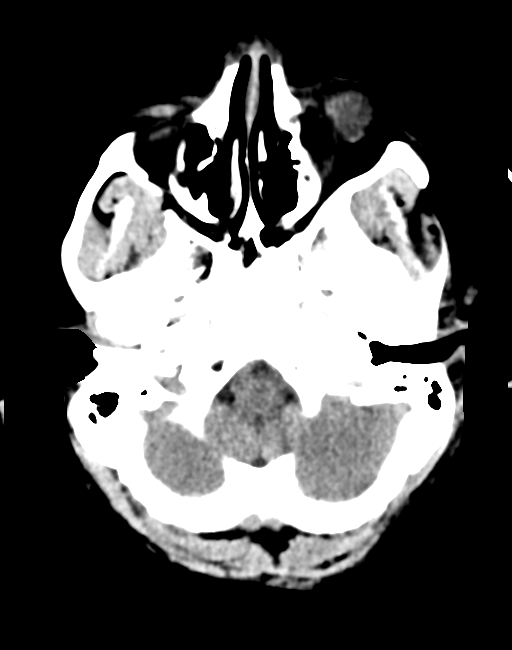
[im 5/33  bone]
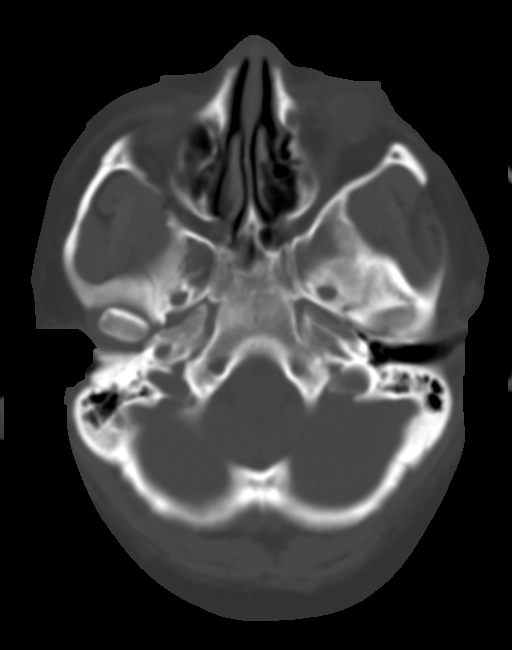
[im 9/33  brain]
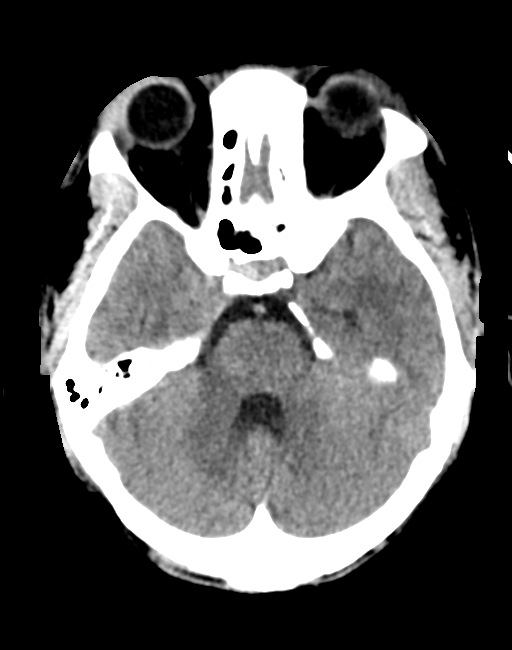
[im 13/33  brain]
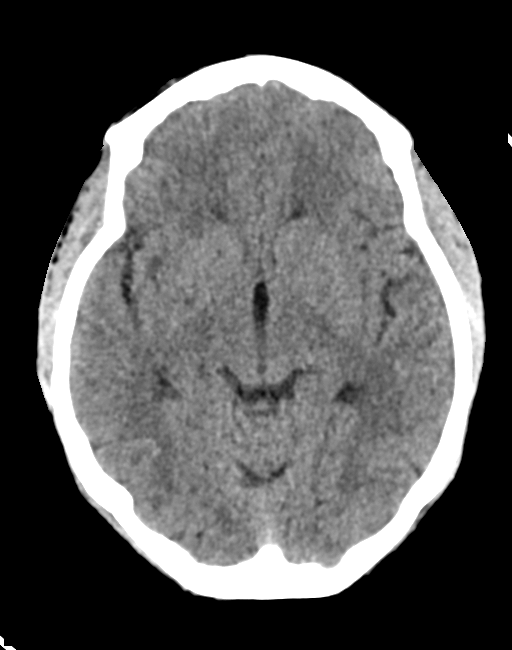
[im 17/33  brain]
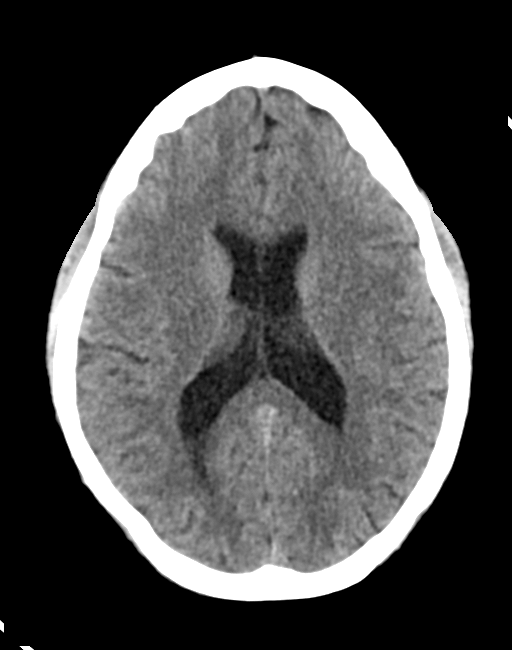
[im 21/33  brain]
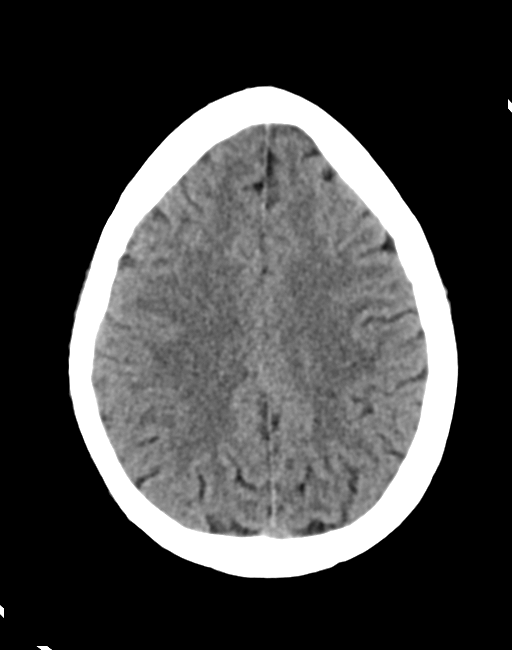
[im 21/33  bone]
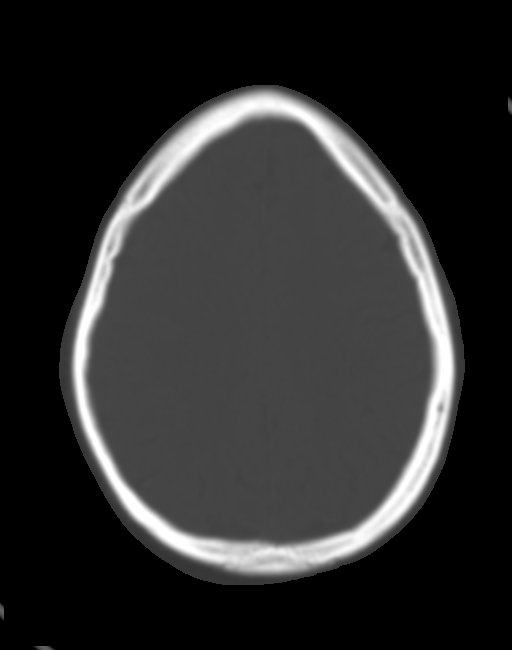
[im 25/33  brain]
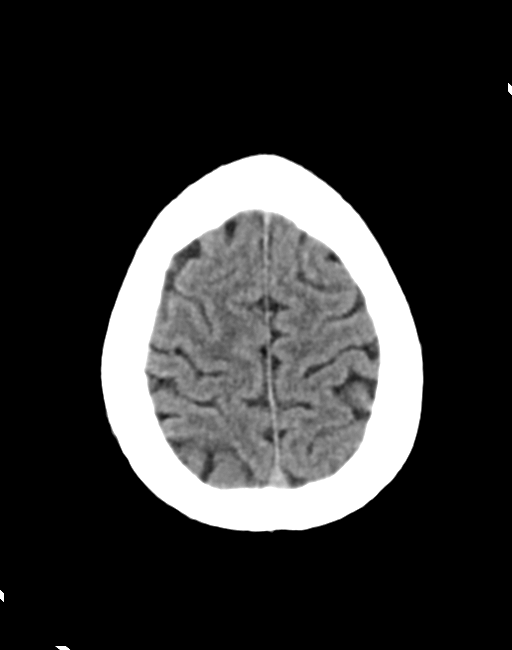
[im 29/33  brain]
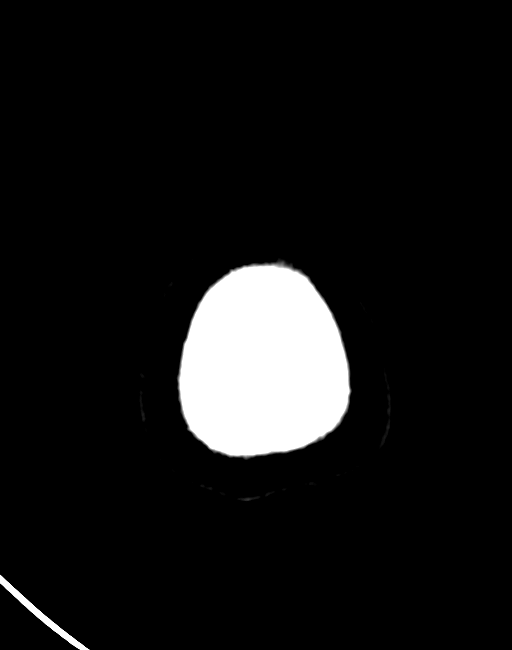

[Series 5: head bone · axial · 0.37mm/px · z∈[-129,-6]mm · 8 of 81 slices shown]
[im 9/81  bone]
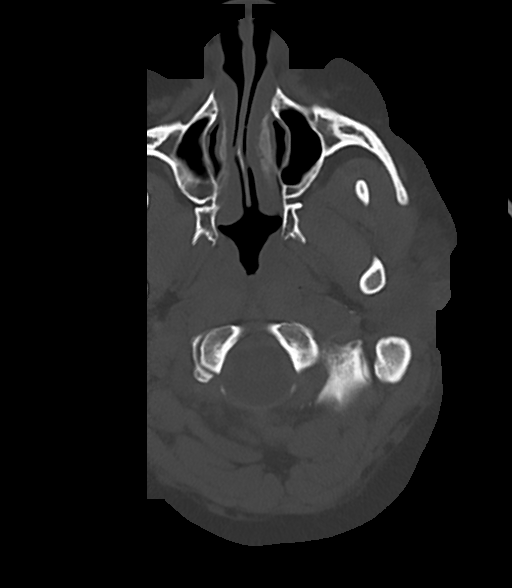
[im 17/81  bone]
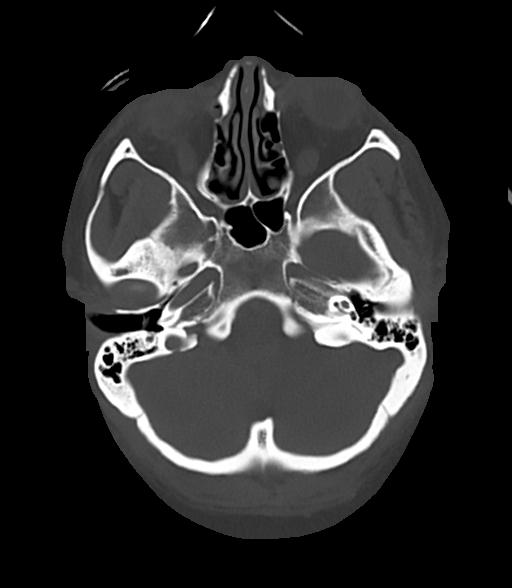
[im 25/81  bone]
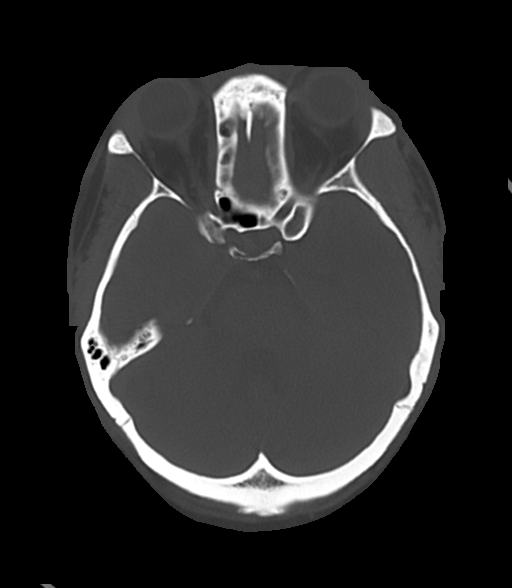
[im 37/81  bone]
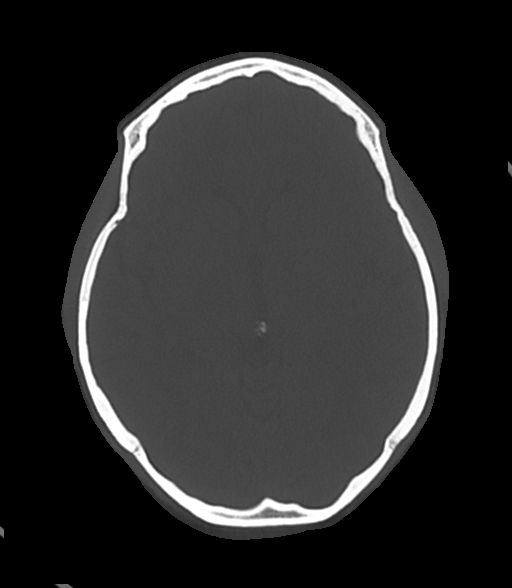
[im 45/81  bone]
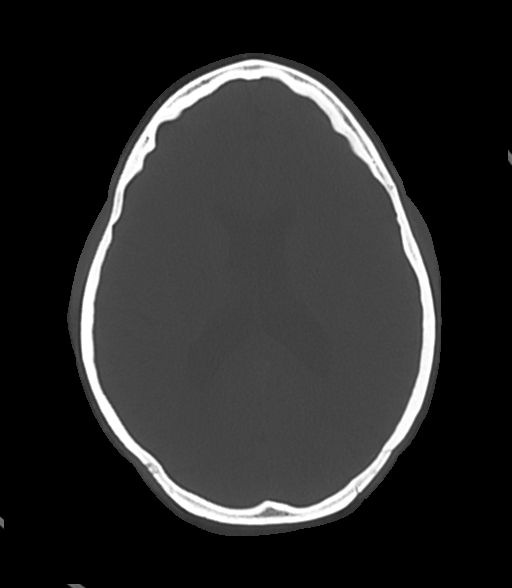
[im 57/81  bone]
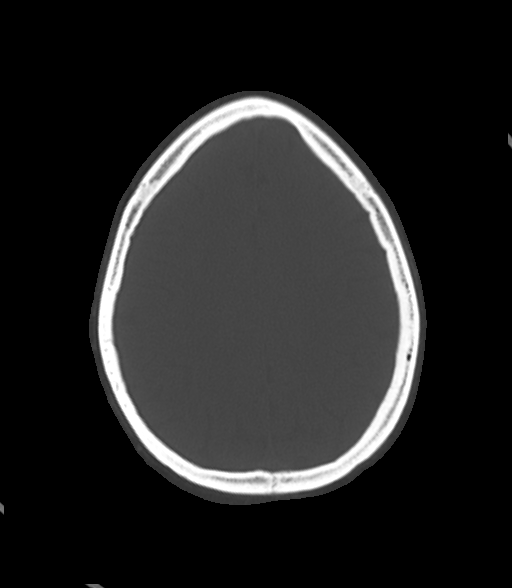
[im 65/81  bone]
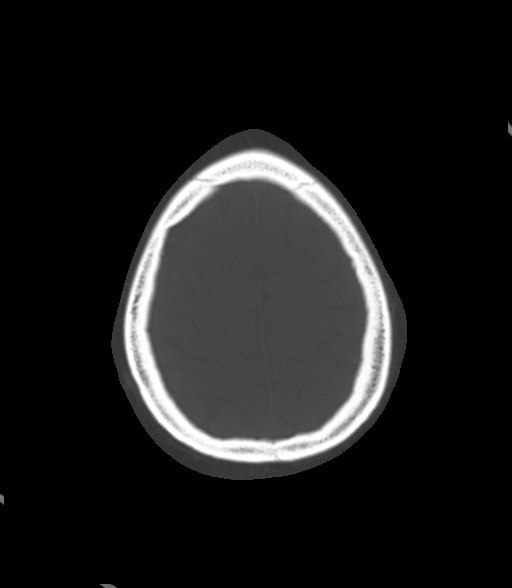
[im 73/81  bone]
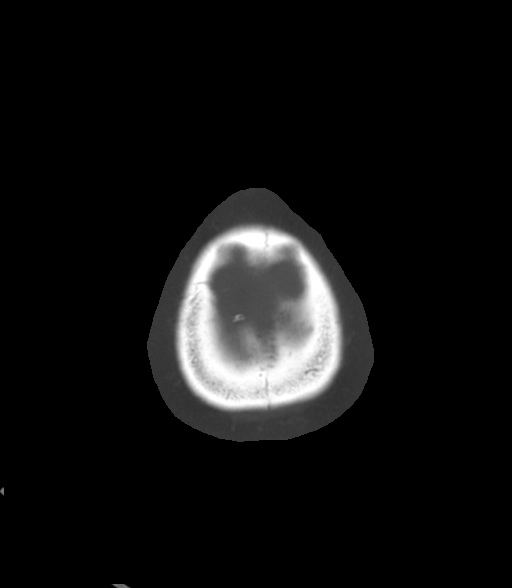

[15 of 30 positions shown; findings below may reference images not displayed]

FINDINGS: Brain: No evidence of acute infarction, hemorrhage, hydrocephalus,
extra-axial collection or mass lesion/mass effect.

Vascular: No hyperdense vessel or unexpected calcification.

Skull: Normal. Negative for fracture or focal lesion.

Sinuses/Orbits: Mild mucosal thickening in the sinuses.

Other: None
IMPRESSION: Negative non contrasted CT appearance of the brain

## 2019-12-01 IMAGING — MR MR HEAD W/O CM
14 of 15 series · 44 of 48 positions shown · non-contrast
Comparison: Head CT without contrast 11/30/2018. Brain MRI
11/24/2017.

CLINICAL DATA: 18-year-old female with altered mental status.
Seizure versus pseudo seizure. Type 1 diabetes with recent
hyperglycemia.

EXAM:
MRI HEAD WITHOUT CONTRAST
TECHNIQUE: Multiplanar, multiecho pulse sequences of the brain and surrounding
structures were obtained without intravenous contrast.

[Series 5: DWI · axial · 3.0mm · 0.88mm/px · z∈[-124,+7]mm · 6 of 92 slices shown (1 of 4)]
[im 1/92]
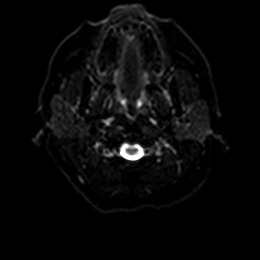
[im 19/92]
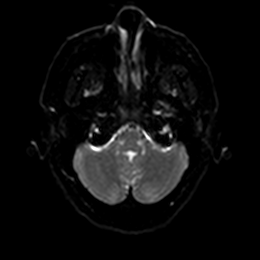
[im 37/92]
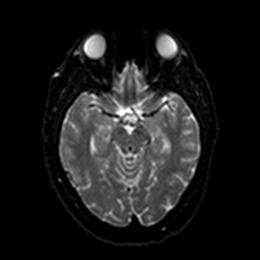
[im 55/92]
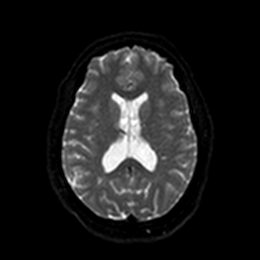
[im 73/92]
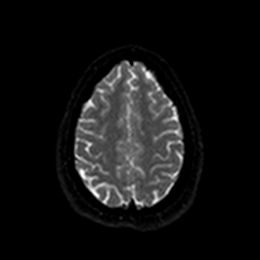
[im 92/92]
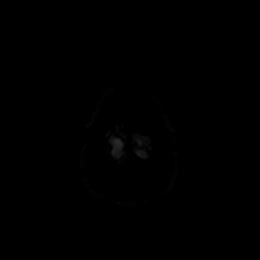

[Series 6: DWI · axial · 3.0mm · 0.88mm/px · z∈[-124,+7]mm · 3 of 46 slices shown (2 of 4)]
[im 1/46]
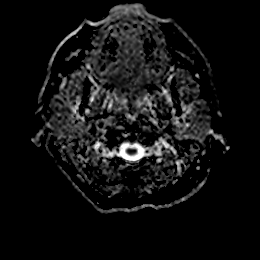
[im 23/46]
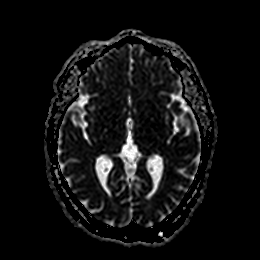
[im 46/46]
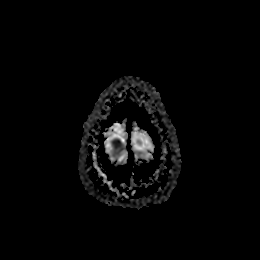

[Series 7: DWI · coronal · 4.0mm · 0.88mm/px · 5 of 68 slices shown (3 of 4)]
[im 1/68]
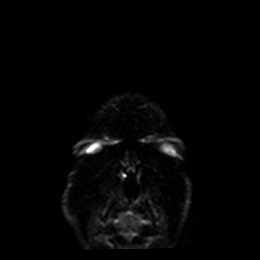
[im 17/68]
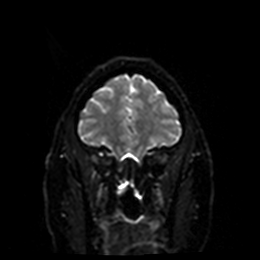
[im 34/68]
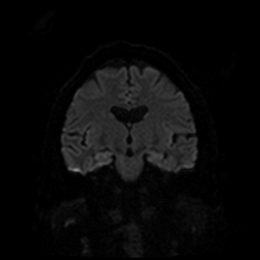
[im 51/68]
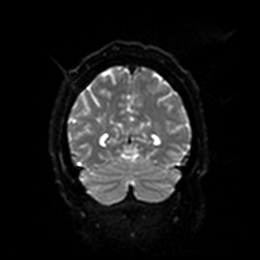
[im 68/68]
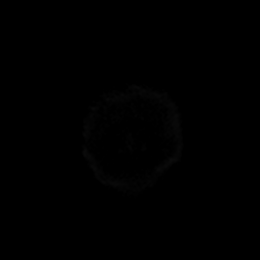

[Series 8: DWI · coronal · 4.0mm · 0.88mm/px · 2 of 34 slices shown (4 of 4)]
[im 1/34]
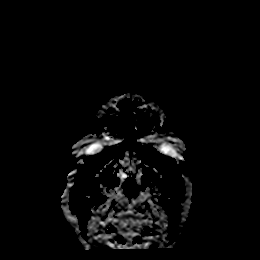
[im 34/34]
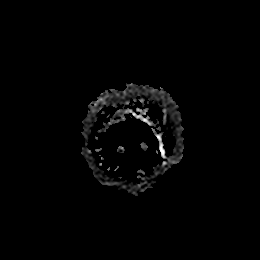

[Series 9: T1 · sagittal · 5.0mm · 0.75mm/px · 2 of 23 slices shown]
[im 1/23]
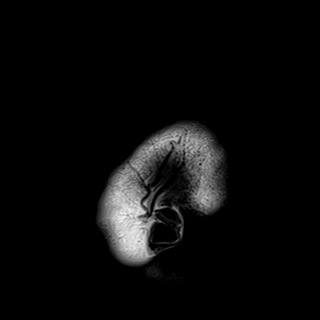
[im 23/23]
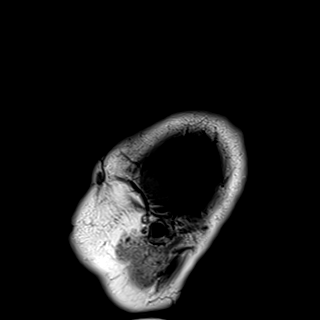

[Series 10: T2 · axial · 5.0mm · 0.72mm/px · z∈[-128,+11]mm · 2 of 25 slices shown (1 of 3)]
[im 1/25]
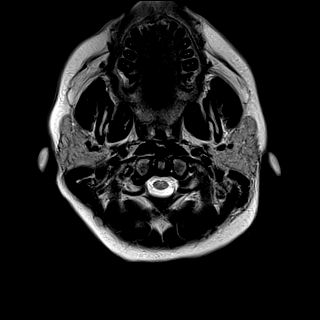
[im 25/25]
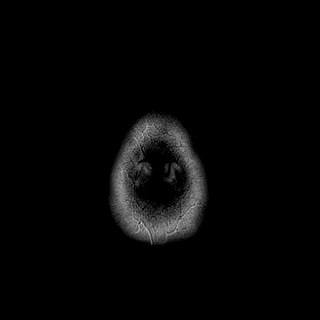

[Series 11: FLAIR · axial · 5.0mm · 0.45mm/px · z∈[-130,+10]mm · 2 of 25 slices shown (1 of 2)]
[im 1/25]
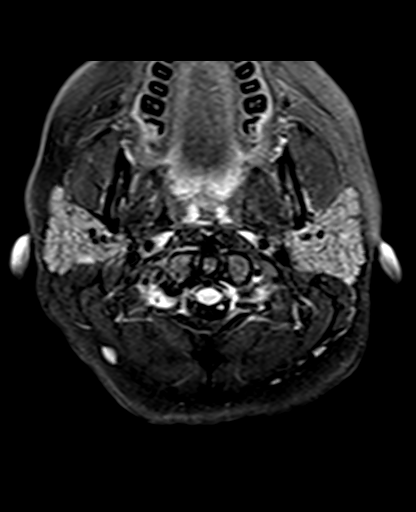
[im 25/25]
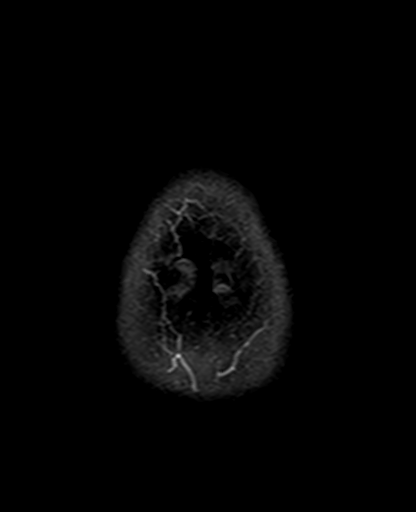

[Series 12: mag_images · axial · 3.0mm · 0.90mm/px · z∈[-139,+32]mm · 4 of 60 slices shown]
[im 1/60]
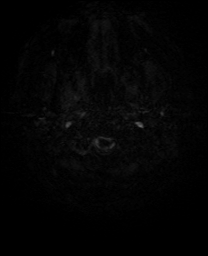
[im 20/60]
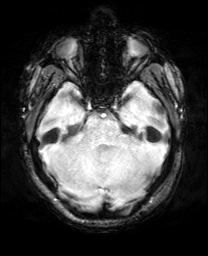
[im 40/60]
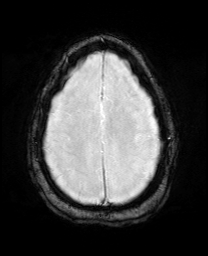
[im 60/60]
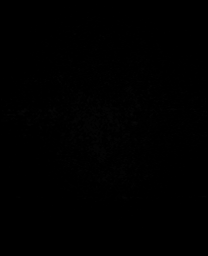

[Series 13: pha_images · axial · 3.0mm · 0.90mm/px · z∈[-139,+24]mm · 4 of 57 slices shown]
[im 1/57]
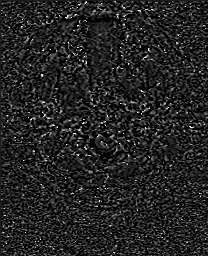
[im 19/57]
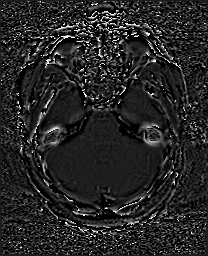
[im 38/57]
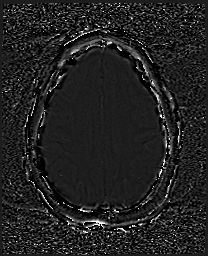
[im 57/57]
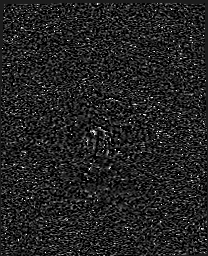

[Series 14: swi_images · axial · 3.0mm · 0.90mm/px · z∈[-139,+32]mm · 4 of 60 slices shown]
[im 1/60]
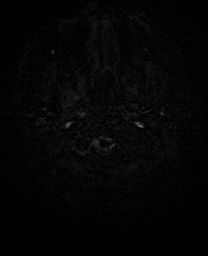
[im 20/60]
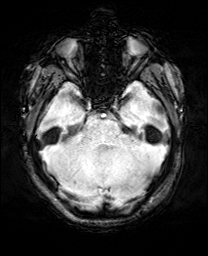
[im 40/60]
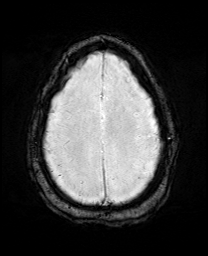
[im 60/60]
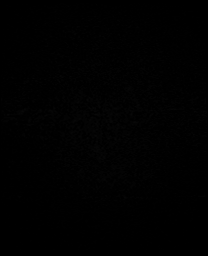

[Series 15: mip_images(sw) · axial · 24.0mm · 0.90mm/px · z∈[-129,+22]mm · 4 of 53 slices shown]
[im 1/53]
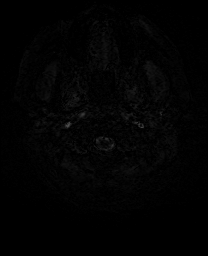
[im 18/53]
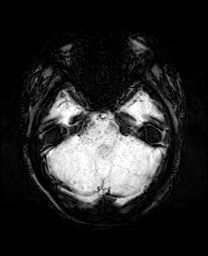
[im 35/53]
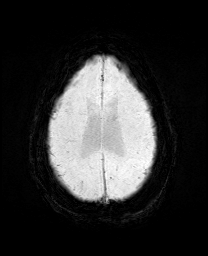
[im 53/53]
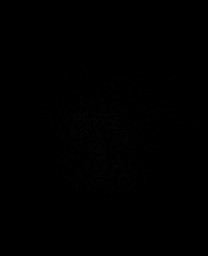

[Series 17: T2 · coronal · 5.0mm · 0.34mm/px · 2 of 29 slices shown (2 of 3)]
[im 1/29]
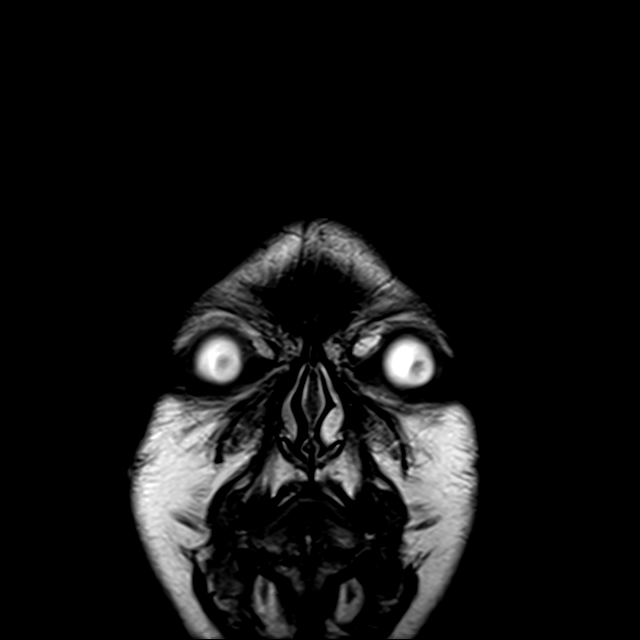
[im 29/29]
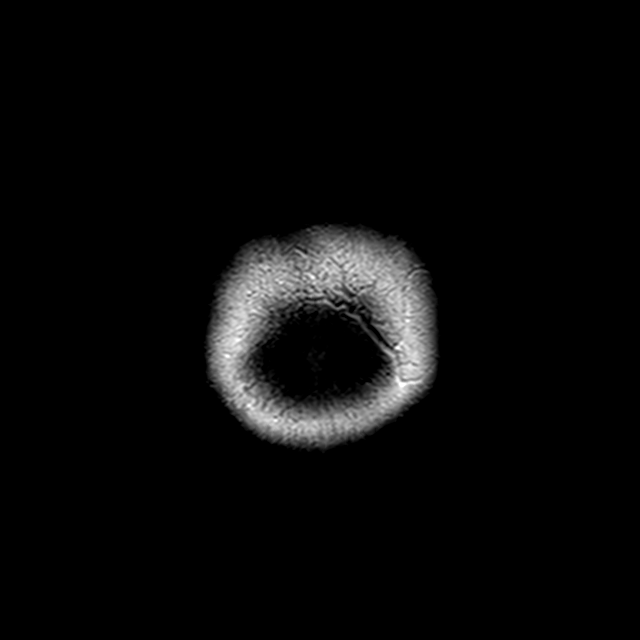

[Series 19: FLAIR · oblique · 3.0mm · 0.56mm/px · 2 of 30 slices shown (2 of 2)]
[im 1/30]
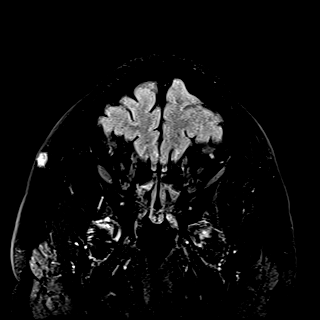
[im 30/30]
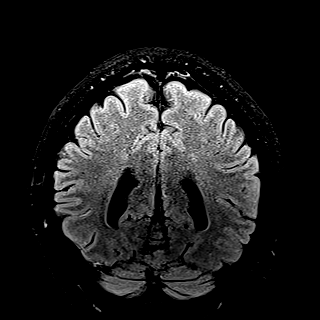

[Series 20: T2 · oblique · 3.0mm · 0.27mm/px · 2 of 30 slices shown (3 of 3)]
[im 1/30]
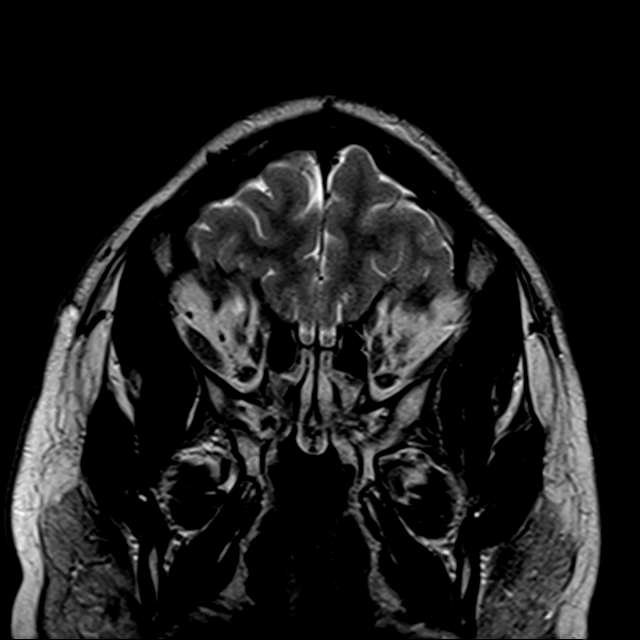
[im 30/30]
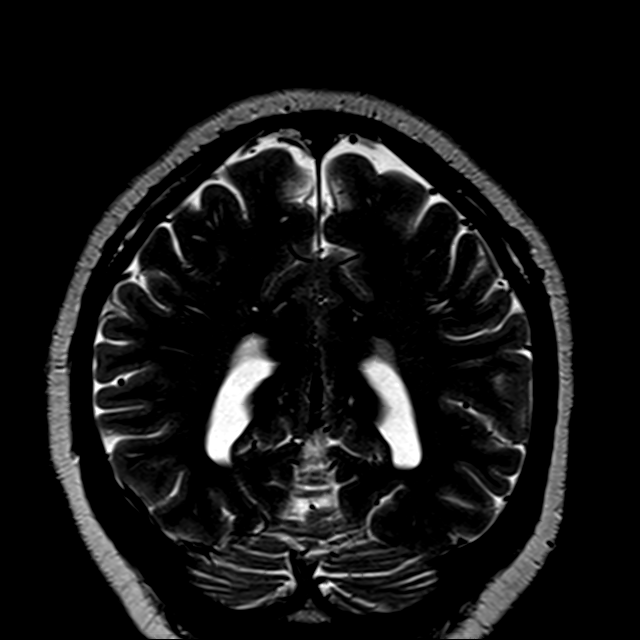

[44 of 48 positions shown; findings below may reference images not displayed]

FINDINGS: Brain: Stable cerebral volume since 3522. Angular appearance of the
ventricles, which can be seen with periventricular leukomalacia (and
this patient has a history of pre term birth per the EMR).

No restricted diffusion to suggest acute infarction. No midline
shift, mass effect, evidence of mass lesion, ventriculomegaly,
extra-axial collection or acute intracranial hemorrhage.
Cervicomedullary junction and pituitary are within normal limits.

Thin slice coronal imaging. The hippocampal formations appear
symmetric and within normal limits. Bilateral temporal lobe signal
elsewhere also appears normal. Gray and white matter signal is
within normal limits throughout the brain. No cortical
encephalomalacia. No chronic cerebral blood products identified.
Deep gray nuclei, brainstem and cerebellum appear normal.

Vascular: Major intracranial vascular flow voids are stable and
appear normal.

Skull and upper cervical spine: Negative visible cervical spine.
Visualized bone marrow signal is within normal limits.

Sinuses/Orbits: Negative orbits, mild motion artifact. Trace
paranasal sinus mucosal thickening is stable since 3522.

Other: Mastoids remain clear. Visible internal auditory structures
appear normal. Scalp and face soft tissues appear negative.
IMPRESSION: 1. No acute intracranial abnormality.
2. Stable since 3522 and negative non-contrast MRI appearance of the
brain aside from suggestion of some perinatal periventricular
leukomalacia.

## 2019-12-19 IMAGING — DX DG CHEST 2V
2 series · 2 of 2 positions shown · non-contrast
Comparison: 11/07/2018

CLINICAL DATA: Chest pain

EXAM:
CHEST - 2 VIEW

[chest lat]
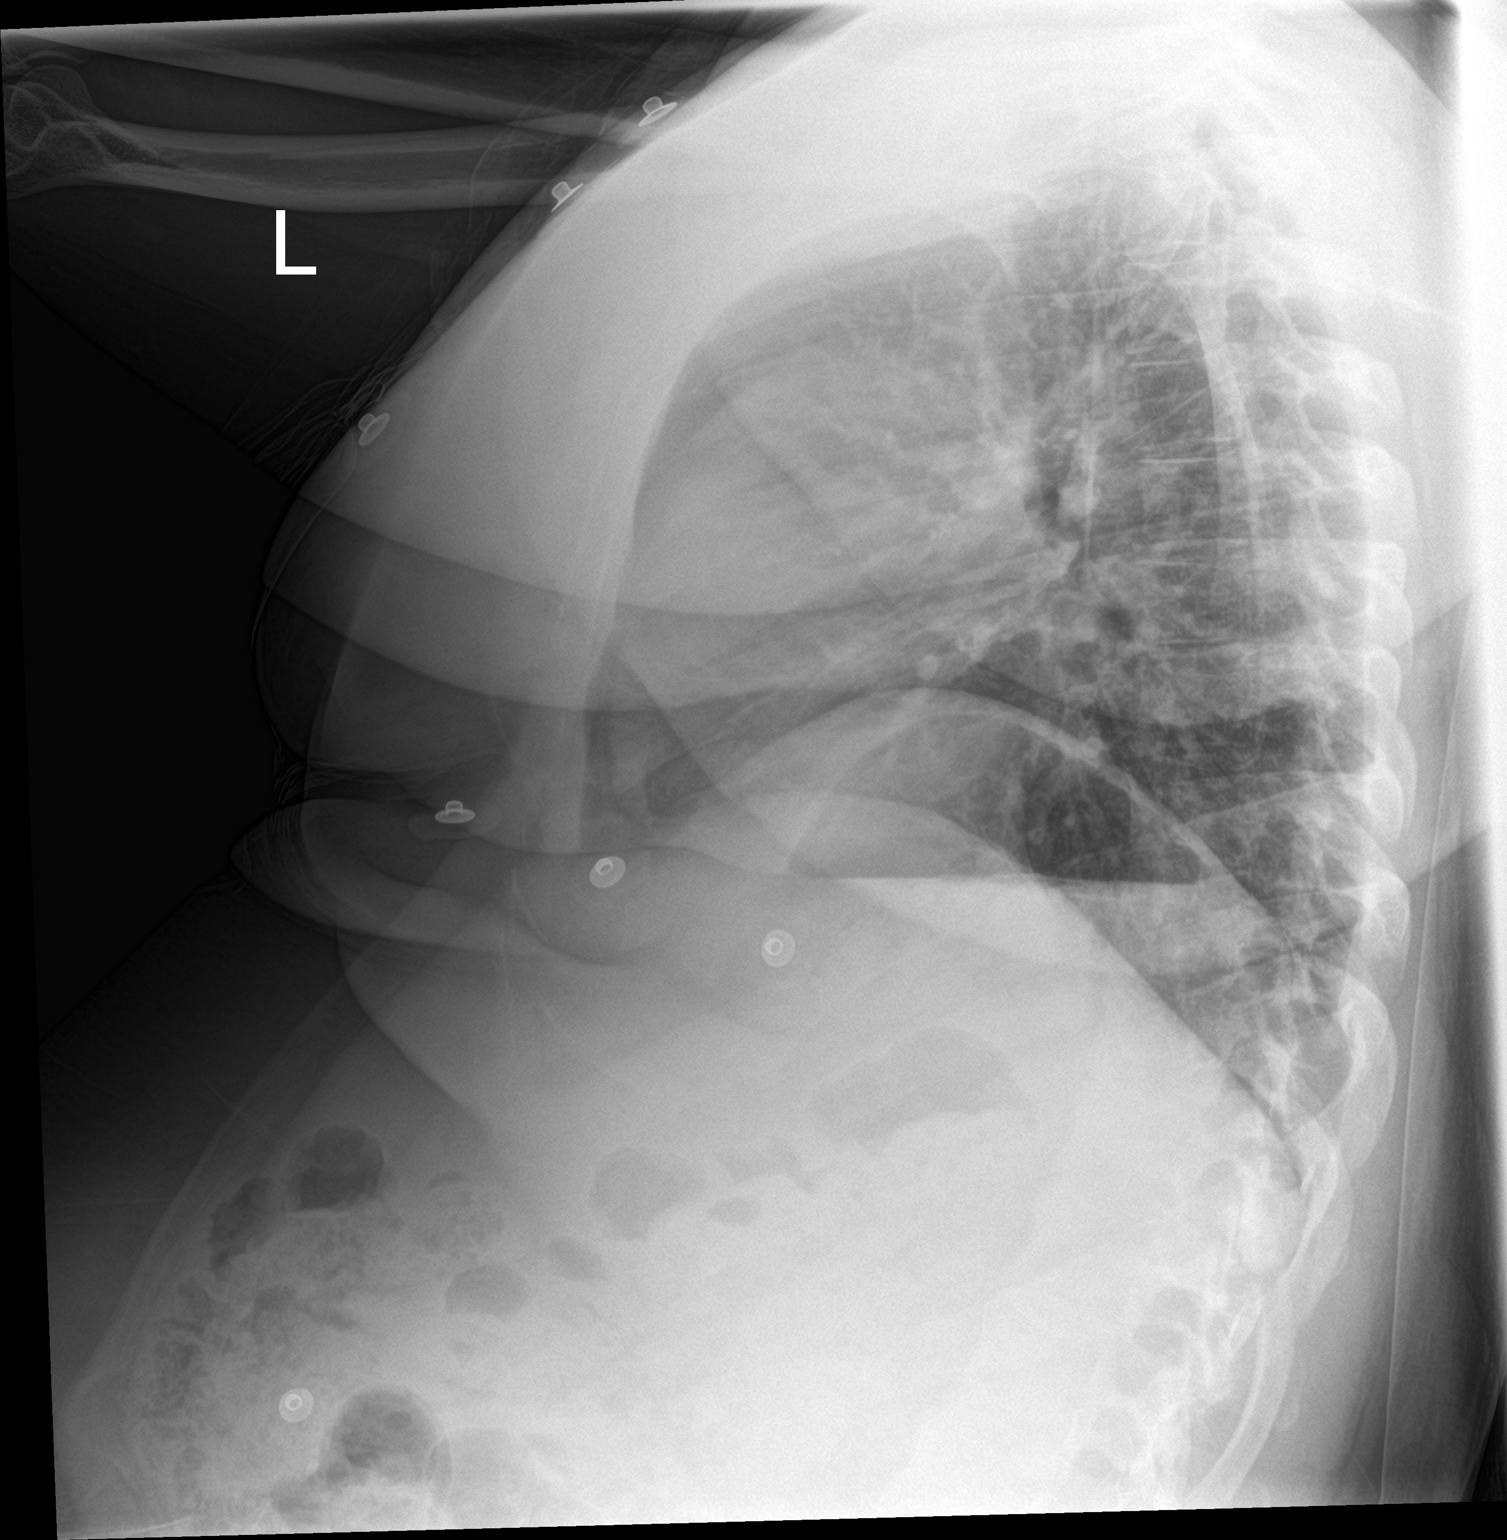

[chest ap]
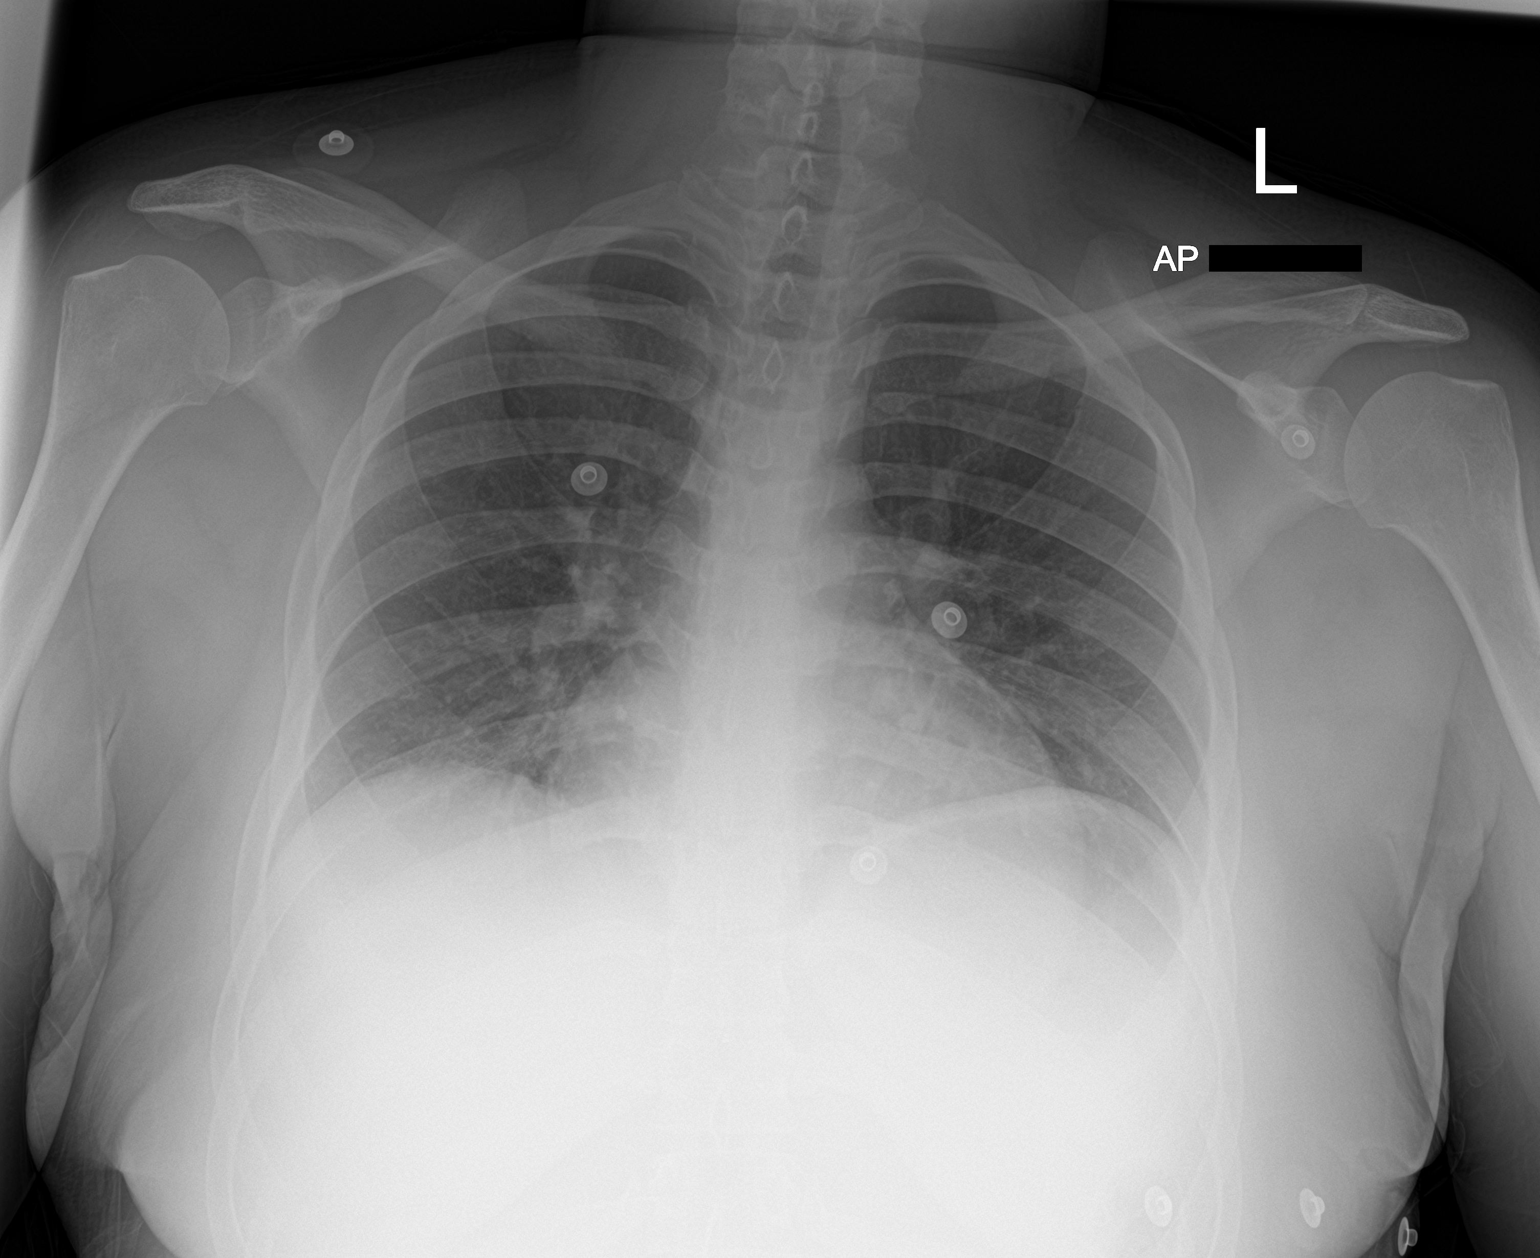

[2 of 2 positions shown; findings below may reference images not displayed]

FINDINGS: Shallow lung inflation. Normal cardiomediastinal contours. No focal
airspace consolidation or pulmonary edema. No pleural effusion or
pneumothorax.
IMPRESSION: No active cardiopulmonary disease.

## 2020-01-01 ENCOUNTER — Other Ambulatory Visit: Payer: Self-pay

## 2020-01-01 ENCOUNTER — Emergency Department (HOSPITAL_COMMUNITY)
Admission: EM | Admit: 2020-01-01 | Discharge: 2020-01-01 | Disposition: A | Discharge status: Home / Self Care | Payer: Medicaid Other | Attending: Emergency Medicine | Admitting: Emergency Medicine

## 2020-01-01 ENCOUNTER — Encounter (HOSPITAL_COMMUNITY): Payer: Self-pay

## 2020-01-01 ENCOUNTER — Emergency Department (HOSPITAL_COMMUNITY): Payer: Medicaid Other

## 2020-01-01 DIAGNOSIS — R32 Unspecified urinary incontinence: Secondary | ICD-10-CM | POA: Insufficient documentation

## 2020-01-01 DIAGNOSIS — R61 Generalized hyperhidrosis: Secondary | ICD-10-CM | POA: Insufficient documentation

## 2020-01-01 DIAGNOSIS — R079 Chest pain, unspecified: Secondary | ICD-10-CM | POA: Insufficient documentation

## 2020-01-01 DIAGNOSIS — E1010 Type 1 diabetes mellitus with ketoacidosis without coma: Secondary | ICD-10-CM | POA: Insufficient documentation

## 2020-01-01 DIAGNOSIS — E1065 Type 1 diabetes mellitus with hyperglycemia: Secondary | ICD-10-CM | POA: Insufficient documentation

## 2020-01-01 DIAGNOSIS — Z79899 Other long term (current) drug therapy: Secondary | ICD-10-CM | POA: Insufficient documentation

## 2020-01-01 DIAGNOSIS — I509 Heart failure, unspecified: Secondary | ICD-10-CM | POA: Diagnosis not present

## 2020-01-01 DIAGNOSIS — I110 Hypertensive heart disease with heart failure: Secondary | ICD-10-CM | POA: Diagnosis not present

## 2020-01-01 DIAGNOSIS — Z20822 Contact with and (suspected) exposure to covid-19: Secondary | ICD-10-CM | POA: Diagnosis not present

## 2020-01-01 DIAGNOSIS — R1084 Generalized abdominal pain: Secondary | ICD-10-CM | POA: Diagnosis not present

## 2020-01-01 DIAGNOSIS — R112 Nausea with vomiting, unspecified: Secondary | ICD-10-CM | POA: Diagnosis not present

## 2020-01-01 DIAGNOSIS — R109 Unspecified abdominal pain: Secondary | ICD-10-CM | POA: Diagnosis present

## 2020-01-01 DIAGNOSIS — R569 Unspecified convulsions: Secondary | ICD-10-CM | POA: Insufficient documentation

## 2020-01-01 DIAGNOSIS — R0600 Dyspnea, unspecified: Secondary | ICD-10-CM | POA: Insufficient documentation

## 2020-01-01 LAB — CBC
HCT: 42.4 % (ref 36.0–46.0)
Hemoglobin: 13.1 g/dL (ref 12.0–15.0)
MCH: 24.3 pg — ABNORMAL LOW (ref 26.0–34.0)
MCHC: 30.9 g/dL (ref 30.0–36.0)
MCV: 78.5 fL — ABNORMAL LOW (ref 80.0–100.0)
Platelets: 372 10*3/uL (ref 150–400)
RBC: 5.4 MIL/uL — ABNORMAL HIGH (ref 3.87–5.11)
RDW: 15 % (ref 11.5–15.5)
WBC: 8.2 10*3/uL (ref 4.0–10.5)
nRBC: 0 % (ref 0.0–0.2)

## 2020-01-01 LAB — HEPATIC FUNCTION PANEL
ALT: 11 U/L (ref 0–44)
AST: 13 U/L — ABNORMAL LOW (ref 15–41)
Albumin: 3.8 g/dL (ref 3.5–5.0)
Alkaline Phosphatase: 89 U/L (ref 38–126)
Bilirubin, Direct: <0.1 mg/dL (ref 0.0–0.2)
Total Bilirubin: 0.4 mg/dL (ref 0.3–1.2)
Total Protein: 7.2 g/dL (ref 6.5–8.1)

## 2020-01-01 LAB — I-STAT BETA HCG BLOOD, ED (MC, WL, AP ONLY): I-stat hCG, quantitative: <5 m[IU]/mL (ref <5)

## 2020-01-01 LAB — BASIC METABOLIC PANEL
Anion gap: 11 (ref 5–15)
BUN: 11 mg/dL (ref 6–20)
CO2: 23 mmol/L (ref 22–32)
Calcium: 9.4 mg/dL (ref 8.9–10.3)
Chloride: 100 mmol/L (ref 98–111)
Creatinine, Ser: 0.57 mg/dL (ref 0.44–1.00)
GFR, Estimated: >60 mL/min (ref >60)
Glucose, Bld: 284 mg/dL — ABNORMAL HIGH (ref 70–99)
Potassium: 4 mmol/L (ref 3.5–5.1)
Sodium: 134 mmol/L — ABNORMAL LOW (ref 135–145)

## 2020-01-01 LAB — TROPONIN I (HIGH SENSITIVITY)
Troponin I (High Sensitivity): <2 ng/L (ref <18)
Troponin I (High Sensitivity): 2 ng/L (ref <18)

## 2020-01-01 LAB — RESP PANEL BY RT-PCR (FLU A&B, COVID) ARPGX2
Influenza A by PCR: NEGATIVE
Influenza B by PCR: NEGATIVE
SARS Coronavirus 2 by RT PCR: NEGATIVE

## 2020-01-01 LAB — LIPASE, BLOOD: Lipase: 23 U/L (ref 11–51)

## 2020-01-01 MED ORDER — LACTATED RINGERS IV BOLUS
1000.0000 mL | Freq: Once | INTRAVENOUS | Status: AC
  Administered 2020-01-01: 21:00:00 1000 mL via INTRAVENOUS

## 2020-01-01 MED ORDER — FAMOTIDINE 20 MG PO TABS: 20.0000 mg | ORAL_TABLET | Freq: Once | ORAL | Status: DC

## 2020-01-01 MED ORDER — HALOPERIDOL LACTATE 5 MG/ML IJ SOLN
5.0000 mg | Freq: Once | INTRAMUSCULAR | Status: AC
  Administered 2020-01-01: 20:00:00 5 mg via INTRAVENOUS
  Filled 2020-01-01: qty 1

## 2020-01-01 MED ORDER — FAMOTIDINE IN NACL 20-0.9 MG/50ML-% IV SOLN
20.0000 mg | Freq: Once | INTRAVENOUS | Status: AC
  Administered 2020-01-01: 20:00:00 20 mg via INTRAVENOUS
  Filled 2020-01-01: qty 50

## 2020-01-01 MED ORDER — ACETAMINOPHEN 500 MG PO TABS
1000.0000 mg | ORAL_TABLET | Freq: Once | ORAL | Status: AC
  Administered 2020-01-01: 20:00:00 1000 mg via ORAL
  Filled 2020-01-01: qty 2

## 2020-01-01 MED ORDER — ONDANSETRON HCL 4 MG PO TABS: 4.0000 mg | ORAL_TABLET | Freq: Once | ORAL | Status: DC

## 2020-01-01 NOTE — Discharge Instructions (Addendum)
Thank you for choosing [facility] for your care today.  To do: Stay away from fried/fatty, spicy, or acidic foods. These can exacerbate abdominal pain due to irritation of the stomach. Drink lots of fluids to help stay hydrated. Pedialyte can be a great choice if you are having difficulty keeping food down. Follow-up with your neurologist as soon as possible to make any needed changes to your seizure prevention regimen. Follow-up with your GI specialist for further evaluation of your abdominal pain, nausea, and vomiting. Please come back to the ER if you have worsening dehydration, if you are unable to keep down fluids, if you have confusion or other mental status changes. Please follow up with your primary care doctor in the next few days. If you do not have a PCP you are established with, you can call [phone]  or [phone]  to access [facility] Find A Doctor service. You can also visit [email or web address] Please come back to the Emergency Department if you have shortness of breath, chest pain, confusion/mental status changes, if you have so much nausea/vomiting that you cannot keep down fluids, or if you have any other symptoms that worry you.  Take care. Hope you start feeling better soon.  Corliss Blacker, MD Emergency Medicine

## 2020-01-01 NOTE — ED Notes (Signed)
Physician at bedside for [place] IV.

## 2020-01-01 NOTE — ED Provider Notes (Signed)
[facility] EMERGENCY DEPARTMENT Provider Note   CSN: [id number] Arrival date & time: 01/01/20  1127     History No chief complaint on file.   Nancy [name] is a 19 y.o. female.  HPI Patient is a 19 year old female with a history of seizure disorder, T1DM, heart failure, chronic abdominal pain and nausea/vomiting, obesity presents to ED with multiple complaints:  - Seizures: Patient states that her seizure disorder is poorly controlled at baseline, with 5-6 seizures per day normally.  However, starting yesterday she has had increased seizure episodes and today her mother told her she had more than 10 seizure episodes.  Described as generalized shaking with associated urinary incontinence.  She notes that she started with a new neurologist recently and they changed her from Lamictal to Keppra about 1 month ago.  She denies any recent weight gain and in fact endorses about 12 to 13 pounds of unintentional weight loss over the last 2 weeks due to decreased appetite and nausea/vomiting. Denies recent dosage changes to the Keppra. - Chest pain: Patient endorses left upper chest pain intermittently over the last several weeks.  It is nonradiating, described as electric, 8/10 in intensity.  Episodes last 35 to 40 minutes and come on frequently.  Associated diaphoresis when chest pain comes on.  Has also been having dyspnea over approximately the last 2 weeks. - Abdominal pain, nausea/vomiting: States that she has issues with chronic nausea/vomiting and abdominal pain.  However over the last 2 days she has had worse pain than usual and now is experiencing her worst abdominal pain ever.  Pain is located diffusely throughout the abdomen, stabbing.  Currently a 9.5/10 in intensity, constant since it began, with waxing and waning intensity.  No identified alleviating or aggravating factors.  She has tried multiple nausea medicines at home, taking hot showers, using heating pad, without  relief.  Patient denies any sick contacts.    Past Medical History:  Diagnosis Date  . Acanthosis nigricans   . Anxiety   . CHF (congestive heart failure) (HCC)   . Chronic lower back pain   . Depression   . DKA, type 1 (HCC) 09/13/2018  . Dyspepsia   . Obesity   . Ovarian cyst    pt is not aware of this hx (11/24/2017)  . 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 Premature baby   . Seizures (HCC)   . Type II diabetes mellitus (HCC)    insulin dependant    Patient Active Problem List   Diagnosis Date Noted  . Diabetic gastroparesis (HCC) 11/25/2019  . Polysubstance abuse (HCC) 11/25/2019  . Gastritis 11/25/2019  . DKA, type 1 (HCC) 11/24/2019  . Cyclical vomiting with nausea 10/01/2019  . Intractable nausea and vomiting 09/30/2019  . Cellulitis   . DKA (diabetic ketoacidoses) 01/27/2019  . Leukocytosis 12/19/2018  . Dehydration   . Pseudoseizures (HCC)   . Type 1 diabetes mellitus with ketoacidosis without coma (HCC) 11/28/2018  . Hyperglycemia due to diabetes mellitus (HCC) 11/27/2018  . 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  . MDD (major depressive disorder), recurrent episode, severe (HCC) 11/06/2018  . Nonspecific abnormal electrocardiogram (ECG) (EKG)   . Chest pain of uncertain etiology   . Hypertensive urgency 10/08/2018  . Conversion disorder with attacks or seizures, acute episode, with psychological stressor 09/16/2018  . MDD (major depressive disorder), recurrent severe, without psychosis (HCC)   . AKI (acute kidney injury) (HCC) 08/04/2018  . Seizure-like activity (HCC) 08/03/2018  .  Depression 07/25/2018  . Syncope 01/30/2018  . Orthostatic hypotension 01/24/2018  . Tachycardia 12/28/2017  . Chronic abdominal pain 12/24/2017  . Chest pain 12/19/2017  . Nausea and vomiting 08/21/2017  . Generalized abdominal pain 08/21/2017  . Non compliance with medical treatment 01/27/2012  . Adjustment disorder 09/16/2011  .  Acanthosis nigricans   . Goiter   . Obesity 06/14/2010  . Hypertension 06/14/2010    Past Surgical History:  Procedure Laterality Date  . ABDOMINAL HERNIA REPAIR     "I was a baby"  . BIOPSY  10/12/2018   Procedure: BIOPSY;  Surgeon: Lynann Bologna, MD;  Location: [facility] ENDOSCOPY;  Service: Endoscopy;;  . ESOPHAGOGASTRODUODENOSCOPY (EGD) WITH PROPOFOL N/A 10/12/2018   Procedure: ESOPHAGOGASTRODUODENOSCOPY (EGD) WITH PROPOFOL;  Surgeon: Lynann Bologna, MD;  Location: [facility] ENDOSCOPY;  Service: Endoscopy;  Laterality: N/A;  . HERNIA REPAIR    . LEFT HEART CATH AND CORONARY ANGIOGRAPHY N/A 10/13/2018   Procedure: LEFT HEART CATH AND CORONARY ANGIOGRAPHY;  Surgeon: Kathleene Hazel, MD;  Location: [facility] INVASIVE CV LAB;  Service: Cardiovascular;  Laterality: N/A;  . TONSILLECTOMY AND ADENOIDECTOMY    . WISDOM TOOTH EXTRACTION  2017     OB History   No obstetric history on file.     Family History  Problem Relation Age of Onset  . Diabetes Mother   . Hypertension Mother   . Obesity Mother   . Asthma Mother   . Allergic rhinitis Mother   . Eczema Mother   . Cervical cancer Mother   . Diabetes Father   . Hypertension Father   . Obesity Father   . Hyperlipidemia Father   . Hypertension Paternal Aunt   . Hypertension Maternal Grandfather   . Colon cancer Maternal Grandfather   . Diabetes Paternal Grandmother   . Obesity Paternal Grandmother   . Diabetes Paternal Grandfather   . Obesity Paternal Grandfather   . Angioedema Neg Hx   . Immunodeficiency Neg Hx   . Urticaria Neg Hx   . Stomach cancer Neg Hx   . Esophageal cancer Neg Hx     Social History   Tobacco Use  . Smoking status: Never Smoker  . Smokeless tobacco: Never Used  Vaping Use  . Vaping Use: Never used  Substance Use Topics  . Alcohol use: No    Alcohol/week: 0.0 standard drinks  . Drug use: No    Home Medications Prior to Admission medications   Medication Sig Start Date End Date Taking? Authorizing  Provider  ARIPiprazole (ABILIFY) 10 MG tablet Take 10 mg by mouth in the morning and at bedtime.     [provider]  atorvastatin (LIPITOR) 20 MG tablet Take 1 tablet (20 mg total) by mouth daily at 6 PM. 10/05/18   Storm Frisk, MD  busPIRone (BUSPAR) 10 MG tablet Take 10 mg by mouth 2 (two) times daily.  11/19/18   [provider]  Caraway Oil-Levomenthol (FDGARD) 25-20.75 MG CAPS Take 2 capsules by mouth as needed. Take as directed as needed Patient not taking: Reported on 10/19/2019 03/21/19   Benancio Deeds, MD  Cyanocobalamin (VITAMIN B-12 PO) Take 1 tablet by mouth daily. Chew one gummy daily     [provider]  diclofenac Sodium (VOLTAREN) 1 % GEL Apply 1 application topically 2 (two) times daily as needed (chest pains).     [provider]  dicyclomine (BENTYL) 10 MG capsule Take 1 capsule (10 mg total) by mouth 4 (four) times daily as needed  for spasms. 11/21/19   Linwood Dibbles, MD  famotidine (PEPCID) 40 MG tablet Take 40 mg by mouth 2 (two) times daily.    [provider]  hydrOXYzine (ATARAX/VISTARIL) 50 MG tablet Take 50 mg by mouth 2 (two) times daily.    [provider]  insulin aspart (NOVOLOG FLEXPEN) 100 UNIT/ML FlexPen Inject 15 Units into the skin 2 (two) times daily with a meal. Only takes once a day bc she only eats once a day    [provider]  lamoTRIgine (LAMICTAL) 25 MG tablet Take 2 tablets (50 mg total) by mouth 2 (two) times daily. For mood stabilization Patient taking differently: Take 25 mg by mouth in the morning, at noon, and at bedtime. For mood stabilization 11/11/18   Nwoko, Nicole Kindred I, NP  LANTUS SOLOSTAR 100 UNIT/ML Solostar Pen Inject 70 Units into the skin at bedtime.  07/05/19   [provider]  lidocaine (LIDODERM) 5 % Place 1 patch onto the skin daily as needed (pain). Remove & Discard patch within 12 hours or as directed by MD    [provider]  lubiprostone (AMITIZA) 24  MCG capsule Take 1 capsule (24 mcg total) by mouth 2 (two) times daily with a meal. Patient not taking: Reported on 10/19/2019 10/27/18   Esterwood, Amy S, PA-C  metoCLOPramide (REGLAN) 5 MG tablet Take 1 tablet (5 mg total) by mouth 3 (three) times daily. Patient taking differently: Take 5 mg by mouth 3 (three) times daily as needed for nausea.  10/03/19   Jerald Kief, MD  mirtazapine (REMERON) 7.5 MG tablet Take 1 tablet (7.5 mg total) by mouth at bedtime. For depression/sleep 11/11/18   Armandina Stammer I, NP  Multiple Vitamin (MULTIVITAMIN WITH MINERALS) TABS tablet Take 1 tablet by mouth daily. 09/18/18   Clapacs, Jackquline Denmark, MD  Olopatadine HCl (PAZEO) 0.7 % SOLN Place 3 drops into both eyes in the morning and at bedtime.    [provider]  pantoprazole (PROTONIX) 40 MG tablet Take 1 tablet (40 mg total) by mouth 2 (two) times daily. For acid reflux Patient taking differently: Take 40 mg by mouth 2 (two) times daily as needed. For acid reflux 01/29/19   Jae Dire, MD  polyethylene glycol (MIRALAX / GLYCOLAX) 17 g packet Take 17 g by mouth daily. Patient not taking: Reported on 10/19/2019 10/03/19   Jerald Kief, MD  prochlorperazine (COMPAZINE) 25 MG suppository Place 1 suppository (25 mg total) rectally every 12 (twelve) hours as needed for nausea or vomiting. 12/05/18   Melene Plan, DO  propranolol (INDERAL) 20 MG tablet Take 1 tablet (20 mg total) by mouth 2 (two) times daily. For anxiety/HTN 12/23/18   Swayze, Ava, DO  QUEtiapine (SEROQUEL) 100 MG tablet Take 300 mg by mouth at bedtime. 07/05/19   [provider]  QUEtiapine (SEROQUEL) 50 MG tablet Take 50 mg by mouth daily.     [provider]  sucralfate (CARAFATE) 1 GM/10ML suspension Take 1 g by mouth 4 (four) times daily -  with meals and at bedtime.  07/05/19   [provider]  topiramate (TOPAMAX) 25 MG tablet Take 1 tablet (25 mg total) by mouth 2 (two) times daily. For seizure activities 11/11/18    Armandina Stammer I, NP    Allergies    Tomato, Ibuprofen, and Oatmeal  Review of Systems   Review of Systems  Constitutional: Positive for appetite change and unexpected weight change. Negative for fever.  HENT: Negative for  ear pain, rhinorrhea and sore throat.   Eyes: Negative for pain and visual disturbance.  Respiratory: Positive for shortness of breath. Negative for cough and wheezing.   Cardiovascular: Positive for chest pain and leg swelling.  Gastrointestinal: Positive for abdominal pain, nausea and vomiting. Negative for diarrhea.  Genitourinary: Negative for dysuria and hematuria.  Musculoskeletal: Negative for arthralgias and back pain.  Skin: Negative for color change and rash.  Neurological: Positive for seizures.  Psychiatric/Behavioral: Negative for confusion and suicidal ideas.  All other systems reviewed and are negative.  Physical Exam Updated Vital Signs BP 128/79 (BP Location: Right Arm)   Pulse 100   Temp 99.2 F (37.3 C) (Oral)   Resp 18   SpO2 99%   Physical Exam Vitals and nursing note reviewed.  Constitutional:      General: She is not in acute distress.    Appearance: She is well-developed and well-nourished. She is obese. She is not ill-appearing or toxic-appearing.  HENT:     Head: Normocephalic and atraumatic.     Nose: Nose normal.     Mouth/Throat:     Mouth: Mucous membranes are moist.     Pharynx: Oropharynx is clear.  Eyes:     General: No scleral icterus.    Extraocular Movements: Extraocular movements intact.  Cardiovascular:     Rate and Rhythm: Normal rate and regular rhythm.     Pulses: Normal pulses.     Heart sounds: No friction rub. No gallop.   Pulmonary:     Effort: Pulmonary effort is normal. No respiratory distress.     Breath sounds: Normal breath sounds. No wheezing, rhonchi or rales.  Abdominal:     Palpations: Abdomen is soft.     Tenderness: There is abdominal tenderness (Diffuse). There is right CVA tenderness. There  is no left CVA tenderness, guarding or rebound.  Musculoskeletal:        General: No edema.     Cervical back: Neck supple.     Right lower leg: No edema.     Left lower leg: No edema.  Skin:    General: Skin is warm and dry.  Neurological:     Mental Status: She is alert.     Comments: Alert, grossly oriented, moves all extremities spontaneously, answering questions appropriately, following commands  Psychiatric:        Mood and Affect: Mood and affect normal.        Behavior: Behavior normal.    ED Results / Procedures / Treatments   Labs (all labs ordered are listed, but only abnormal results are displayed) Labs Reviewed  BASIC METABOLIC PANEL - Abnormal; Notable for the following components:      Result Value   Sodium 134 (*)    Glucose, Bld 284 (*)    All other components within normal limits  CBC - Abnormal; Notable for the following components:   RBC 5.40 (*)    MCV 78.5 (*)    MCH 24.3 (*)    All other components within normal limits  HEPATIC FUNCTION PANEL - Abnormal; Notable for the following components:   AST 13 (*)    All other components within normal limits  RESP PANEL BY RT-PCR (FLU A&B, COVID) ARPGX2  URINE CULTURE  LIPASE, BLOOD  URINALYSIS, ROUTINE W REFLEX MICROSCOPIC  I-STAT BETA HCG BLOOD, ED ([facility], [facility], [facility] ONLY)  TROPONIN I (HIGH SENSITIVITY)  TROPONIN I (HIGH SENSITIVITY)    EKG EKG Interpretation  Date/Time:  Sunday January 01 2020  12:38:53 EST Ventricular Rate:  112 PR Interval:  112 QRS Duration: 96 QT Interval:  334 QTC Calculation: 455 R Axis:   61 Text Interpretation: Sinus tachycardia Otherwise normal ECG No significant change since last tracing Confirmed by Gwyneth Sprout ([id number]) on 01/01/2020 4:29:36 PM   Radiology DG Chest Portable 1 View  Result Date: 01/01/2020 CLINICAL DATA:  Chest pain EXAM: PORTABLE CHEST 1 VIEW COMPARISON:  November 24, 2019 FINDINGS: The heart size and mediastinal contours are within normal limits.  Both lungs are clear. The visualized skeletal structures are unremarkable. IMPRESSION: No active disease. Electronically Signed   By: Katherine Mantle M.D.   On: 01/01/2020 18:04    Procedures Procedures (including critical care time)  Medications Ordered in ED Medications  acetaminophen (TYLENOL) tablet 1,000 mg (1,000 mg Oral Given 01/01/20 2028)  haloperidol lactate (HALDOL) injection 5 mg (5 mg Intravenous Given 01/01/20 2028)  lactated ringers bolus 1,000 mL (0 mLs Intravenous Stopped 01/01/20 2227)  famotidine (PEPCID) IVPB 20 mg premix (0 mg Intravenous Stopped 01/01/20 2114)    ED Course  I have reviewed the triage vital signs and the nursing notes.  Pertinent labs & imaging results that were available during my care of the patient were reviewed by me and considered in my medical decision making (see chart for details).    MDM Rules/Calculators/A&P                          19 y/o female with chest pain, nausea/vomiting, abdominal pain, increased seizure activity. Pt tachycardic but hemodynamically stable on presentation. She appears to be at her mental status baseline and does not seem post-ictal; pt agrees she is now at her baseline following her seizure activity. LR bolus ordered given recent nausea/vomiting and decreased appetite/poor PO intake. Acetaminophen PO and IV haldol and famotidine ordered for pain and nausea relief.  Differentials considered: ACS, gastritis/PUD, gastroparesis (pt states this has been ruled out by her GI specialist after multiple rounds of testing), early gastroenteritis, inadequate AED dosing possibly due to difficulty keeping down her seizure medications, pseudoseizures, electrolyte derangement, dehydration, UTI. ED provider interpretation of lab results: Mild hyponatremia (134), troponin negative, lipase WNL, i-STAT hCG negative.  Unremarkable hepatic function panel. Negative COVID and flu A/B. ED provider interpretation of imaging results: CXR  without acute abnormality. No pneumomediastinum, pneumoperitoneum, or evidence of pulmonary disease. ED provider interpretation of ECG: Sinus tachycardia with a rate of 112.  PR 112, QRS 96, QTc 455.  There is no STEMI or evidence of acute ischemia/infarct.  Nonspecific intraventricular conduction delay.  MDM: On reassessment following fluids and other medications, patient says she is feeling much better.  Her pain level has reduced to a 6/10 and her abdomen remains soft and now tender primarily over the epigastric region.  She has not had any further episodes of vomiting or seizures during several hours of observation through ED course.  Patient is asking to go home.  Labs and imaging do not suggest significant metabolic abnormality associated with her pain.  There is no evidence on imaging to suggest esophageal perforation or perforated abdominal viscus.  She says that her abdominal pain overall feels exactly like her chronic episodes, just more severe.  Impression is likely exacerbation of chronic abdominal pain with possible gastritis and reduced efficacy of AED due to frequent nausea/vomiting.  We discussed following up closely with her PCP, GI specialist, and neurologist to ensure that her AED regimen is titrated correctly and  she has any needed further evaluation of her chronic abdominal pain.  We also discussed avoiding trigger foods for potential gastritis component of her pain and discussed continuing home anti-emetics as well as Pepcid (which pt says she has at home).  Patient's tachycardia has resolved following fluids.  At this time, pt appears safe for discharge with strict return precautions provided and instructions for follow up given. Pt voiced understanding and is amenable to this plan. Pt discharged in stable condition.  Final Clinical Impression(s) / ED Diagnoses Final diagnoses:  Generalized abdominal pain  Non-intractable vomiting with nausea, unspecified vomiting type      Corliss Blacker, MD 01/02/20 0071    Gwyneth Sprout, MD 01/02/20 2204

## 2020-01-01 NOTE — ED Notes (Addendum)
IV just now established by PA. Tylenol and Haldol for this Pt left with on-coming RN for med admin.

## 2020-01-01 NOTE — ED Triage Notes (Signed)
Patient arived by [facility] with multiple complaints. Seizures hx of same,abdominal pain with nausea/ vomiting/ also complains of CP. Patient alert and oriented

## 2020-01-13 ENCOUNTER — Inpatient Hospital Stay (HOSPITAL_COMMUNITY)
Admission: EM | Admit: 2020-01-13 | Discharge: 2020-01-16 | DRG: 074 | Disposition: A | Discharge status: Home / Self Care | Payer: Medicaid Other | Attending: Internal Medicine | Admitting: Internal Medicine

## 2020-01-13 DIAGNOSIS — F1910 Other psychoactive substance abuse, uncomplicated: Secondary | ICD-10-CM | POA: Diagnosis present

## 2020-01-13 DIAGNOSIS — F32A Depression, unspecified: Secondary | ICD-10-CM | POA: Diagnosis present

## 2020-01-13 DIAGNOSIS — F419 Anxiety disorder, unspecified: Secondary | ICD-10-CM | POA: Diagnosis present

## 2020-01-13 DIAGNOSIS — R109 Unspecified abdominal pain: Secondary | ICD-10-CM

## 2020-01-13 DIAGNOSIS — Z8249 Family history of ischemic heart disease and other diseases of the circulatory system: Secondary | ICD-10-CM

## 2020-01-13 DIAGNOSIS — Z6835 Body mass index (BMI) 35.0-35.9, adult: Secondary | ICD-10-CM

## 2020-01-13 DIAGNOSIS — Z79899 Other long term (current) drug therapy: Secondary | ICD-10-CM

## 2020-01-13 DIAGNOSIS — E1165 Type 2 diabetes mellitus with hyperglycemia: Secondary | ICD-10-CM | POA: Diagnosis present

## 2020-01-13 DIAGNOSIS — I10 Essential (primary) hypertension: Secondary | ICD-10-CM | POA: Diagnosis present

## 2020-01-13 DIAGNOSIS — Z20822 Contact with and (suspected) exposure to covid-19: Secondary | ICD-10-CM | POA: Diagnosis present

## 2020-01-13 DIAGNOSIS — R6510 Systemic inflammatory response syndrome (SIRS) of non-infectious origin without acute organ dysfunction: Secondary | ICD-10-CM | POA: Diagnosis present

## 2020-01-13 DIAGNOSIS — K5909 Other constipation: Secondary | ICD-10-CM | POA: Diagnosis present

## 2020-01-13 DIAGNOSIS — M545 Low back pain, unspecified: Secondary | ICD-10-CM | POA: Diagnosis present

## 2020-01-13 DIAGNOSIS — I152 Hypertension secondary to endocrine disorders: Secondary | ICD-10-CM | POA: Diagnosis present

## 2020-01-13 DIAGNOSIS — E669 Obesity, unspecified: Secondary | ICD-10-CM | POA: Diagnosis present

## 2020-01-13 DIAGNOSIS — R1084 Generalized abdominal pain: Secondary | ICD-10-CM | POA: Diagnosis present

## 2020-01-13 DIAGNOSIS — Z800 Family history of malignant neoplasm of digestive organs: Secondary | ICD-10-CM

## 2020-01-13 DIAGNOSIS — E1043 Type 1 diabetes mellitus with diabetic autonomic (poly)neuropathy: Principal | ICD-10-CM | POA: Diagnosis present

## 2020-01-13 DIAGNOSIS — F17210 Nicotine dependence, cigarettes, uncomplicated: Secondary | ICD-10-CM | POA: Diagnosis present

## 2020-01-13 DIAGNOSIS — K3184 Gastroparesis: Secondary | ICD-10-CM | POA: Diagnosis present

## 2020-01-13 DIAGNOSIS — R739 Hyperglycemia, unspecified: Secondary | ICD-10-CM | POA: Diagnosis present

## 2020-01-13 DIAGNOSIS — L83 Acanthosis nigricans: Secondary | ICD-10-CM | POA: Diagnosis present

## 2020-01-13 DIAGNOSIS — E1110 Type 2 diabetes mellitus with ketoacidosis without coma: Secondary | ICD-10-CM | POA: Diagnosis present

## 2020-01-13 DIAGNOSIS — Z825 Family history of asthma and other chronic lower respiratory diseases: Secondary | ICD-10-CM

## 2020-01-13 DIAGNOSIS — R112 Nausea with vomiting, unspecified: Secondary | ICD-10-CM

## 2020-01-13 DIAGNOSIS — K219 Gastro-esophageal reflux disease without esophagitis: Secondary | ICD-10-CM | POA: Diagnosis present

## 2020-01-13 DIAGNOSIS — Z8049 Family history of malignant neoplasm of other genital organs: Secondary | ICD-10-CM

## 2020-01-13 DIAGNOSIS — Z833 Family history of diabetes mellitus: Secondary | ICD-10-CM

## 2020-01-13 DIAGNOSIS — Z83438 Family history of other disorder of lipoprotein metabolism and other lipidemia: Secondary | ICD-10-CM

## 2020-01-13 DIAGNOSIS — G8929 Other chronic pain: Secondary | ICD-10-CM | POA: Diagnosis present

## 2020-01-13 DIAGNOSIS — E1010 Type 1 diabetes mellitus with ketoacidosis without coma: Secondary | ICD-10-CM | POA: Diagnosis present

## 2020-01-13 DIAGNOSIS — Z886 Allergy status to analgesic agent status: Secondary | ICD-10-CM

## 2020-01-13 DIAGNOSIS — Z794 Long term (current) use of insulin: Secondary | ICD-10-CM

## 2020-01-13 DIAGNOSIS — G40909 Epilepsy, unspecified, not intractable, without status epilepticus: Secondary | ICD-10-CM | POA: Diagnosis present

## 2020-01-13 LAB — CBC
HCT: 43.5 % (ref 36.0–46.0)
Hemoglobin: 13.8 g/dL (ref 12.0–15.0)
MCH: 24.5 pg — ABNORMAL LOW (ref 26.0–34.0)
MCHC: 31.7 g/dL (ref 30.0–36.0)
MCV: 77.1 fL — ABNORMAL LOW (ref 80.0–100.0)
Platelets: 362 10*3/uL (ref 150–400)
RBC: 5.64 MIL/uL — ABNORMAL HIGH (ref 3.87–5.11)
RDW: 14.9 % (ref 11.5–15.5)
WBC: 10.6 10*3/uL — ABNORMAL HIGH (ref 4.0–10.5)
nRBC: 0 % (ref 0.0–0.2)

## 2020-01-13 NOTE — ED Triage Notes (Signed)
Pt BIB [facility] for eval of multiple complaints. Multiple seizures today, hx of same. Abdominal pain, N/V x3 days, hyperglycemic for EMS, CBG 478.  Took zofran at approx 2000, no relief.  EMS unable to obtain line for meds

## 2020-01-14 ENCOUNTER — Other Ambulatory Visit: Payer: Self-pay

## 2020-01-14 ENCOUNTER — Observation Stay (HOSPITAL_COMMUNITY): Payer: Medicaid Other

## 2020-01-14 DIAGNOSIS — E1110 Type 2 diabetes mellitus with ketoacidosis without coma: Secondary | ICD-10-CM | POA: Diagnosis present

## 2020-01-14 DIAGNOSIS — R1084 Generalized abdominal pain: Secondary | ICD-10-CM | POA: Diagnosis not present

## 2020-01-14 DIAGNOSIS — E1165 Type 2 diabetes mellitus with hyperglycemia: Secondary | ICD-10-CM | POA: Diagnosis not present

## 2020-01-14 DIAGNOSIS — I10 Essential (primary) hypertension: Secondary | ICD-10-CM | POA: Diagnosis not present

## 2020-01-14 DIAGNOSIS — R112 Nausea with vomiting, unspecified: Secondary | ICD-10-CM | POA: Diagnosis not present

## 2020-01-14 DIAGNOSIS — R6510 Systemic inflammatory response syndrome (SIRS) of non-infectious origin without acute organ dysfunction: Secondary | ICD-10-CM | POA: Diagnosis present

## 2020-01-14 LAB — COMPREHENSIVE METABOLIC PANEL
ALT: 15 U/L (ref 0–44)
AST: 17 U/L (ref 15–41)
Albumin: 4.4 g/dL (ref 3.5–5.0)
Alkaline Phosphatase: 100 U/L (ref 38–126)
Anion gap: 16 — ABNORMAL HIGH (ref 5–15)
BUN: 7 mg/dL (ref 6–20)
CO2: 20 mmol/L — ABNORMAL LOW (ref 22–32)
Calcium: 9.8 mg/dL (ref 8.9–10.3)
Chloride: 99 mmol/L (ref 98–111)
Creatinine, Ser: 0.62 mg/dL (ref 0.44–1.00)
GFR, Estimated: >60 mL/min (ref >60)
Glucose, Bld: 332 mg/dL — ABNORMAL HIGH (ref 70–99)
Potassium: 4 mmol/L (ref 3.5–5.1)
Sodium: 135 mmol/L (ref 135–145)
Total Bilirubin: 0.9 mg/dL (ref 0.3–1.2)
Total Protein: 8.2 g/dL — ABNORMAL HIGH (ref 6.5–8.1)

## 2020-01-14 LAB — BASIC METABOLIC PANEL
Anion gap: 14 (ref 5–15)
BUN: 8 mg/dL (ref 6–20)
CO2: 22 mmol/L (ref 22–32)
Calcium: 9.3 mg/dL (ref 8.9–10.3)
Chloride: 97 mmol/L — ABNORMAL LOW (ref 98–111)
Creatinine, Ser: 0.67 mg/dL (ref 0.44–1.00)
GFR, Estimated: >60 mL/min (ref >60)
Glucose, Bld: 317 mg/dL — ABNORMAL HIGH (ref 70–99)
Potassium: 3.9 mmol/L (ref 3.5–5.1)
Sodium: 133 mmol/L — ABNORMAL LOW (ref 135–145)

## 2020-01-14 LAB — I-STAT BETA HCG BLOOD, ED (MC, WL, AP ONLY): I-stat hCG, quantitative: <5 m[IU]/mL (ref <5)

## 2020-01-14 LAB — I-STAT VENOUS BLOOD GAS, ED
Acid-Base Excess: 1 mmol/L (ref 0.0–2.0)
Bicarbonate: 25.2 mmol/L (ref 20.0–28.0)
Calcium, Ion: 1.14 mmol/L — ABNORMAL LOW (ref 1.15–1.40)
HCT: 44 % (ref 36.0–46.0)
Hemoglobin: 15 g/dL (ref 12.0–15.0)
O2 Saturation: 90 %
Potassium: 3.9 mmol/L (ref 3.5–5.1)
Sodium: 135 mmol/L (ref 135–145)
TCO2: 26 mmol/L (ref 22–32)
pCO2, Ven: 37.7 mmHg — ABNORMAL LOW (ref 44.0–60.0)
pH, Ven: 7.433 — ABNORMAL HIGH (ref 7.250–7.430)
pO2, Ven: 56 mmHg — ABNORMAL HIGH (ref 32.0–45.0)

## 2020-01-14 LAB — RESP PANEL BY RT-PCR (FLU A&B, COVID) ARPGX2
Influenza A by PCR: NEGATIVE
Influenza B by PCR: NEGATIVE
SARS Coronavirus 2 by RT PCR: NEGATIVE

## 2020-01-14 LAB — TROPONIN I (HIGH SENSITIVITY)
Troponin I (High Sensitivity): <2 ng/L (ref <18)
Troponin I (High Sensitivity): 3 ng/L (ref <18)

## 2020-01-14 LAB — CBG MONITORING, ED
Glucose-Capillary: 440 mg/dL — ABNORMAL HIGH (ref 70–99)
Glucose-Capillary: 253 mg/dL — ABNORMAL HIGH (ref 70–99)
Glucose-Capillary: 206 mg/dL — ABNORMAL HIGH (ref 70–99)
Glucose-Capillary: 216 mg/dL — ABNORMAL HIGH (ref 70–99)

## 2020-01-14 LAB — BETA-HYDROXYBUTYRIC ACID: Beta-Hydroxybutyric Acid: 1.9 mmol/L — ABNORMAL HIGH (ref 0.05–0.27)

## 2020-01-14 LAB — LIPASE, BLOOD: Lipase: 20 U/L (ref 11–51)

## 2020-01-14 MED ORDER — SENNA 8.6 MG PO TABS: 1.0000 | ORAL_TABLET | Freq: Two times a day (BID) | ORAL | Status: DC | PRN

## 2020-01-14 MED ORDER — PANTOPRAZOLE SODIUM 40 MG PO TBEC
40.0000 mg | DELAYED_RELEASE_TABLET | Freq: Two times a day (BID) | ORAL | Status: DC
  Administered 2020-01-14 – 2020-01-16 (×4): 40 mg via ORAL
  Filled 2020-01-14 (×4): qty 1

## 2020-01-14 MED ORDER — OLOPATADINE HCL 0.1 % OP SOLN: 1.0000 [drp] | Freq: Two times a day (BID) | OPHTHALMIC | Status: DC | PRN

## 2020-01-14 MED ORDER — SODIUM CHLORIDE 0.9 % IV SOLN: INTRAVENOUS | Status: DC

## 2020-01-14 MED ORDER — SCOPOLAMINE 1 MG/3DAYS TD PT72
1.0000 | MEDICATED_PATCH | TRANSDERMAL | Status: DC
  Administered 2020-01-14: 1.5 mg via TRANSDERMAL
  Filled 2020-01-14: qty 1

## 2020-01-14 MED ORDER — TOPIRAMATE 25 MG PO TABS
25.0000 mg | ORAL_TABLET | Freq: Two times a day (BID) | ORAL | Status: DC
  Administered 2020-01-14 – 2020-01-16 (×4): 25 mg via ORAL
  Filled 2020-01-14 (×5): qty 1

## 2020-01-14 MED ORDER — LACTATED RINGERS IV SOLN: INTRAVENOUS | Status: DC

## 2020-01-14 MED ORDER — QUETIAPINE FUMARATE 25 MG PO TABS
25.0000 mg | ORAL_TABLET | Freq: Every morning | ORAL | Status: DC
  Administered 2020-01-15 – 2020-01-16 (×2): 25 mg via ORAL
  Filled 2020-01-14 (×2): qty 1

## 2020-01-14 MED ORDER — LORAZEPAM 2 MG/ML IJ SOLN: 0.5000 mg | Freq: Once | INTRAMUSCULAR | Status: DC

## 2020-01-14 MED ORDER — SENNOSIDES-DOCUSATE SODIUM 8.6-50 MG PO TABS
1.0000 | ORAL_TABLET | Freq: Two times a day (BID) | ORAL | Status: DC
  Administered 2020-01-14 – 2020-01-16 (×4): 1 via ORAL
  Filled 2020-01-14 (×4): qty 1

## 2020-01-14 MED ORDER — PROPRANOLOL HCL 20 MG PO TABS
20.0000 mg | ORAL_TABLET | Freq: Two times a day (BID) | ORAL | Status: DC
  Administered 2020-01-14 – 2020-01-16 (×4): 20 mg via ORAL
  Filled 2020-01-14 (×5): qty 1

## 2020-01-14 MED ORDER — BUSPIRONE HCL 5 MG PO TABS
10.0000 mg | ORAL_TABLET | Freq: Two times a day (BID) | ORAL | Status: DC
  Administered 2020-01-14 – 2020-01-16 (×4): 10 mg via ORAL
  Filled 2020-01-14: qty 1
  Filled 2020-01-14: qty 2
  Filled 2020-01-14: qty 1
  Filled 2020-01-14: qty 2

## 2020-01-14 MED ORDER — LIDOCAINE 5 % EX PTCH
1.0000 | MEDICATED_PATCH | Freq: Every day | CUTANEOUS | Status: DC | PRN
  Filled 2020-01-14: qty 1

## 2020-01-14 MED ORDER — SODIUM CHLORIDE 0.9 % IV BOLUS
1000.0000 mL | Freq: Once | INTRAVENOUS | Status: AC
  Administered 2020-01-14: 1000 mL via INTRAVENOUS

## 2020-01-14 MED ORDER — HYDROMORPHONE HCL 1 MG/ML IJ SOLN
0.5000 mg | Freq: Once | INTRAMUSCULAR | Status: AC
  Administered 2020-01-14: 0.5 mg via INTRAVENOUS
  Filled 2020-01-14: qty 1

## 2020-01-14 MED ORDER — INSULIN REGULAR(HUMAN) IN NACL 100-0.9 UT/100ML-% IV SOLN
INTRAVENOUS | Status: DC
Start: 2020-01-14 — End: 2020-01-14
  Administered 2020-01-14: 9 [IU]/h via INTRAVENOUS
  Filled 2020-01-14: qty 100

## 2020-01-14 MED ORDER — PROMETHAZINE HCL 25 MG/ML IJ SOLN
25.0000 mg | Freq: Once | INTRAMUSCULAR | Status: AC
  Administered 2020-01-14: 25 mg via INTRAVENOUS

## 2020-01-14 MED ORDER — INSULIN ASPART 100 UNIT/ML ~~LOC~~ SOLN
0.0000 [IU] | Freq: Three times a day (TID) | SUBCUTANEOUS | Status: DC
  Administered 2020-01-15: 7 [IU] via SUBCUTANEOUS
  Administered 2020-01-15 (×2): 4 [IU] via SUBCUTANEOUS
  Administered 2020-01-16 (×2): 7 [IU] via SUBCUTANEOUS

## 2020-01-14 MED ORDER — MORPHINE SULFATE (PF) 2 MG/ML IV SOLN
2.0000 mg | INTRAVENOUS | Status: DC | PRN
  Administered 2020-01-14 – 2020-01-15 (×2): 2 mg via INTRAVENOUS
  Filled 2020-01-14 (×2): qty 1

## 2020-01-14 MED ORDER — SODIUM CHLORIDE 0.9% FLUSH
3.0000 mL | Freq: Two times a day (BID) | INTRAVENOUS | Status: DC
  Administered 2020-01-15 – 2020-01-16 (×2): 3 mL via INTRAVENOUS

## 2020-01-14 MED ORDER — DEXTROSE IN LACTATED RINGERS 5 % IV SOLN: INTRAVENOUS | Status: DC

## 2020-01-14 MED ORDER — INSULIN GLARGINE 100 UNIT/ML ~~LOC~~ SOLN
35.0000 [IU] | SUBCUTANEOUS | Status: AC
  Administered 2020-01-14: 35 [IU] via SUBCUTANEOUS
  Filled 2020-01-14: qty 0.35

## 2020-01-14 MED ORDER — METOCLOPRAMIDE HCL 5 MG PO TABS
5.0000 mg | ORAL_TABLET | Freq: Three times a day (TID) | ORAL | Status: DC
  Administered 2020-01-14 – 2020-01-16 (×4): 5 mg via ORAL
  Filled 2020-01-14 (×5): qty 1

## 2020-01-14 MED ORDER — SUCRALFATE 1 GM/10ML PO SUSP
1.0000 g | Freq: Every evening | ORAL | Status: DC | PRN
  Administered 2020-01-16: 1 g via ORAL
  Filled 2020-01-14 (×2): qty 10

## 2020-01-14 MED ORDER — LUBIPROSTONE 24 MCG PO CAPS
24.0000 ug | ORAL_CAPSULE | Freq: Two times a day (BID) | ORAL | Status: DC
  Administered 2020-01-15 – 2020-01-16 (×3): 24 ug via ORAL
  Filled 2020-01-14 (×4): qty 1

## 2020-01-14 MED ORDER — DEXTROSE 50 % IV SOLN: 0.0000 mL | INTRAVENOUS | Status: DC | PRN

## 2020-01-14 MED ORDER — ALBUTEROL SULFATE (2.5 MG/3ML) 0.083% IN NEBU: 2.5000 mg | INHALATION_SOLUTION | Freq: Four times a day (QID) | RESPIRATORY_TRACT | Status: DC | PRN

## 2020-01-14 MED ORDER — PROMETHAZINE HCL 25 MG/ML IJ SOLN
25.0000 mg | Freq: Once | INTRAMUSCULAR | Status: DC
  Filled 2020-01-14: qty 1

## 2020-01-14 MED ORDER — QUETIAPINE FUMARATE 100 MG PO TABS
300.0000 mg | ORAL_TABLET | Freq: Every day | ORAL | Status: DC
  Administered 2020-01-14 – 2020-01-15 (×2): 300 mg via ORAL
  Filled 2020-01-14: qty 3
  Filled 2020-01-14: qty 1
  Filled 2020-01-14: qty 2

## 2020-01-14 MED ORDER — HYDROXYZINE HCL 25 MG PO TABS
50.0000 mg | ORAL_TABLET | Freq: Every morning | ORAL | Status: DC
  Administered 2020-01-15 – 2020-01-16 (×2): 50 mg via ORAL
  Filled 2020-01-14 (×2): qty 2

## 2020-01-14 MED ORDER — ENOXAPARIN SODIUM 40 MG/0.4ML ~~LOC~~ SOLN
40.0000 mg | SUBCUTANEOUS | Status: DC
  Administered 2020-01-14 – 2020-01-15 (×2): 40 mg via SUBCUTANEOUS
  Filled 2020-01-14 (×3): qty 0.4

## 2020-01-14 MED ORDER — ATORVASTATIN CALCIUM 10 MG PO TABS
20.0000 mg | ORAL_TABLET | Freq: Every day | ORAL | Status: DC
  Administered 2020-01-14 – 2020-01-16 (×3): 20 mg via ORAL
  Filled 2020-01-14 (×3): qty 2

## 2020-01-14 MED ORDER — LAMOTRIGINE 25 MG PO TABS
25.0000 mg | ORAL_TABLET | Freq: Two times a day (BID) | ORAL | Status: DC
  Administered 2020-01-14 – 2020-01-16 (×4): 25 mg via ORAL
  Filled 2020-01-14 (×5): qty 1

## 2020-01-14 MED ORDER — MIRTAZAPINE 15 MG PO TABS
7.5000 mg | ORAL_TABLET | Freq: Every day | ORAL | Status: DC
  Administered 2020-01-14 – 2020-01-15 (×2): 7.5 mg via ORAL
  Filled 2020-01-14 (×3): qty 1

## 2020-01-14 MED ORDER — DICYCLOMINE HCL 10 MG PO CAPS
10.0000 mg | ORAL_CAPSULE | Freq: Four times a day (QID) | ORAL | Status: DC | PRN
  Administered 2020-01-14: 10 mg via ORAL
  Filled 2020-01-14: qty 1

## 2020-01-14 NOTE — ED Provider Notes (Signed)
[facility] EMERGENCY DEPARTMENT Provider Note   CSN: [id number] Arrival date & time: 01/13/20  2248     History Chief Complaint  Patient presents with  . Nausea  . Seizures  . Hyperglycemia    Nancy [name] is a 20 y.o. female.  20 year old female brought in by EMS, history of diabetes and seizures.  Patient states that she been vomiting for the past 3 days, unable to keep her meds down, feels that she is in DKA because she has insomnia.  Reports generalized abdominal discomfort, denies fevers, chills, sick contacts or respiratory complaints.        Past Medical History:  Diagnosis Date  . Acanthosis nigricans   . Anxiety   . CHF (congestive heart failure) (HCC)   . Chronic lower back pain   . Depression   . DKA, type 1 (HCC) 09/13/2018  . Dyspepsia   . Obesity   . Ovarian cyst    pt is not aware of this hx (11/24/2017)  . 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 Premature baby   . Seizures (HCC)   . Type II diabetes mellitus (HCC)    insulin dependant    Patient Active Problem List   Diagnosis Date Noted  . DKA (diabetic ketoacidosis) (HCC) 01/14/2020  . Diabetic gastroparesis (HCC) 11/25/2019  . Polysubstance abuse (HCC) 11/25/2019  . Gastritis 11/25/2019  . DKA, type 1 (HCC) 11/24/2019  . Cyclical vomiting with nausea 10/01/2019  . Intractable nausea and vomiting 09/30/2019  . Cellulitis   . DKA (diabetic ketoacidoses) 01/27/2019  . Leukocytosis 12/19/2018  . Dehydration   . Pseudoseizures (HCC)   . Type 1 diabetes mellitus with ketoacidosis without coma (HCC) 11/28/2018  . Hyperglycemia due to diabetes mellitus (HCC) 11/27/2018  . 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  . MDD (major depressive disorder), recurrent episode, severe (HCC) 11/06/2018  . Nonspecific abnormal electrocardiogram (ECG) (EKG)   . Chest pain of uncertain etiology   . Hypertensive urgency 10/08/2018  . Conversion disorder with  attacks or seizures, acute episode, with psychological stressor 09/16/2018  . MDD (major depressive disorder), recurrent severe, without psychosis (HCC)   . AKI (acute kidney injury) (HCC) 08/04/2018  . Seizure-like activity (HCC) 08/03/2018  . Depression 07/25/2018  . Syncope 01/30/2018  . Orthostatic hypotension 01/24/2018  . Tachycardia 12/28/2017  . Chronic abdominal pain 12/24/2017  . Chest pain 12/19/2017  . Nausea and vomiting 08/21/2017  . Generalized abdominal pain 08/21/2017  . Non compliance with medical treatment 01/27/2012  . Adjustment disorder 09/16/2011  . Acanthosis nigricans   . Goiter   . Obesity 06/14/2010  . Hypertension 06/14/2010    Past Surgical History:  Procedure Laterality Date  . ABDOMINAL HERNIA REPAIR     "I was a baby"  . BIOPSY  10/12/2018   Procedure: BIOPSY;  Surgeon: Lynann Bologna, MD;  Location: [facility] ENDOSCOPY;  Service: Endoscopy;;  . ESOPHAGOGASTRODUODENOSCOPY (EGD) WITH PROPOFOL N/A 10/12/2018   Procedure: ESOPHAGOGASTRODUODENOSCOPY (EGD) WITH PROPOFOL;  Surgeon: Lynann Bologna, MD;  Location: [facility] ENDOSCOPY;  Service: Endoscopy;  Laterality: N/A;  . HERNIA REPAIR    . LEFT HEART CATH AND CORONARY ANGIOGRAPHY N/A 10/13/2018   Procedure: LEFT HEART CATH AND CORONARY ANGIOGRAPHY;  Surgeon: Kathleene Hazel, MD;  Location: [facility] INVASIVE CV LAB;  Service: Cardiovascular;  Laterality: N/A;  . TONSILLECTOMY AND ADENOIDECTOMY    . WISDOM TOOTH EXTRACTION  2017     OB History   No obstetric history on file.  Family History  Problem Relation Age of Onset  . Diabetes Mother   . Hypertension Mother   . Obesity Mother   . Asthma Mother   . Allergic rhinitis Mother   . Eczema Mother   . Cervical cancer Mother   . Diabetes Father   . Hypertension Father   . Obesity Father   . Hyperlipidemia Father   . Hypertension Paternal Aunt   . Hypertension Maternal Grandfather   . Colon cancer Maternal Grandfather   . Diabetes Paternal Grandmother    . Obesity Paternal Grandmother   . Diabetes Paternal Grandfather   . Obesity Paternal Grandfather   . Angioedema Neg Hx   . Immunodeficiency Neg Hx   . Urticaria Neg Hx   . Stomach cancer Neg Hx   . Esophageal cancer Neg Hx     Social History   Tobacco Use  . Smoking status: Never Smoker  . Smokeless tobacco: Never Used  Vaping Use  . Vaping Use: Never used  Substance Use Topics  . Alcohol use: No    Alcohol/week: 0.0 standard drinks  . Drug use: No    Home Medications Prior to Admission medications   Medication Sig Start Date End Date Taking? Authorizing Provider  ARIPiprazole (ABILIFY) 10 MG tablet Take 10 mg by mouth in the morning and at bedtime.    Yes [provider]  atorvastatin (LIPITOR) 20 MG tablet Take 1 tablet (20 mg total) by mouth daily at 6 PM. Patient taking differently: Take 20 mg by mouth daily. 10/05/18  Yes Storm FriskWright, Patrick E, MD  busPIRone (BUSPAR) 10 MG tablet Take 10 mg by mouth 2 (two) times daily.  11/19/18  Yes [provider]  Cyanocobalamin (VITAMIN B-12 PO) Take 1 tablet by mouth daily.   Yes [provider]  diclofenac Sodium (VOLTAREN) 1 % GEL Apply 1 application topically 2 (two) times daily as needed (chest pains).   Yes [provider]  hydrOXYzine (ATARAX/VISTARIL) 50 MG tablet Take 50 mg by mouth every morning.   Yes [provider]  insulin aspart (NOVOLOG FLEXPEN) 100 UNIT/ML FlexPen Inject 15 Units into the skin See admin instructions. Inject 15 units subcutaneously up to three times daily after meals   Yes [provider]  insulin glargine (LANTUS) 100 unit/mL SOPN Inject 70 Units into the skin at bedtime.   Yes [provider]  lamoTRIgine (LAMICTAL) 25 MG tablet Take 2 tablets (50 mg total) by mouth 2 (two) times daily. For mood stabilization Patient taking differently: Take 25 mg by mouth 2 (two) times daily. For mood stabilization 11/11/18  Yes Nwoko, Agnes I, NP  lidocaine  (LIDODERM) 5 % Place 1 patch onto the skin daily as needed (pain). Remove & Discard patch within 12 hours or as directed by MD   Yes [provider]  lubiprostone (AMITIZA) 24 MCG capsule Take 1 capsule (24 mcg total) by mouth 2 (two) times daily with a meal. 10/27/18  Yes Esterwood, Amy S, PA-C  metoCLOPramide (REGLAN) 5 MG tablet Take 1 tablet (5 mg total) by mouth 3 (three) times daily. 10/03/19  Yes Jerald Kiefhiu, Stephen K, MD  mirtazapine (REMERON) 7.5 MG tablet Take 1 tablet (7.5 mg total) by mouth at bedtime. For depression/sleep 11/11/18  Yes Armandina StammerNwoko, Agnes I, NP  Multiple Vitamin (MULTIVITAMIN WITH MINERALS) TABS tablet Take 1 tablet by mouth daily. 09/18/18  Yes Clapacs, Jackquline DenmarkJohn T, MD  Olopatadine HCl (PAZEO) 0.7 % SOLN Place 2 drops into both eyes 2 (two) times  daily as needed (allergies).   Yes [provider]  pantoprazole (PROTONIX) 40 MG tablet Take 1 tablet (40 mg total) by mouth 2 (two) times daily. For acid reflux 01/29/19  Yes Jae Dire, MD  polyethylene glycol (MIRALAX / GLYCOLAX) 17 g packet Take 17 g by mouth daily. 10/03/19  Yes Jerald Kief, MD  prochlorperazine (COMPAZINE) 25 MG suppository Place 1 suppository (25 mg total) rectally every 12 (twelve) hours as needed for nausea or vomiting. 12/05/18  Yes Melene Plan, DO  propranolol (INDERAL) 20 MG tablet Take 1 tablet (20 mg total) by mouth 2 (two) times daily. For anxiety/HTN 12/23/18  Yes Swayze, Ava, DO  QUEtiapine (SEROQUEL) 100 MG tablet Take 300 mg by mouth at bedtime. 07/05/19  Yes [provider]  QUEtiapine (SEROQUEL) 25 MG tablet Take 25 mg by mouth every morning.   Yes [provider]  sucralfate (CARAFATE) 1 GM/10ML suspension Take 1 g by mouth at bedtime as needed (stomach pain). 07/05/19  Yes [provider]  topiramate (TOPAMAX) 25 MG tablet Take 1 tablet (25 mg total) by mouth 2 (two) times daily. For seizure activities 11/11/18  Yes Armandina Stammer I, NP  dicyclomine (BENTYL) 10 MG  capsule Take 1 capsule (10 mg total) by mouth 4 (four) times daily as needed for spasms. Patient not taking: No sig reported 11/21/19   Linwood Dibbles, MD    Allergies    Tomato, Ibuprofen, and Oatmeal  Review of Systems   Review of Systems  Constitutional: Negative for chills and fever.  HENT: Negative for congestion.   Respiratory: Negative for cough and shortness of breath.   Cardiovascular: Negative for chest pain.  Gastrointestinal: Positive for abdominal pain, nausea and vomiting. Negative for constipation and diarrhea.  Genitourinary: Negative for dysuria and frequency.  Musculoskeletal: Negative for arthralgias and myalgias.  Skin: Negative for rash and wound.  Allergic/Immunologic: Positive for immunocompromised state.  Neurological: Positive for seizures. Negative for weakness.  Hematological: Negative for adenopathy.  All other systems reviewed and are negative.   Physical Exam Updated Vital Signs BP 134/74   Pulse (!) 129   Temp 98.6 F (37 C) (Oral)   Resp (!) 25   SpO2 100%   Physical Exam Vitals and nursing note reviewed.  Constitutional:      General: She is not in acute distress.    Appearance: She is well-developed and well-nourished. She is not diaphoretic.  HENT:     Head: Normocephalic and atraumatic.     Mouth/Throat:     Mouth: Mucous membranes are dry.  Cardiovascular:     Rate and Rhythm: Regular rhythm. Tachycardia present.     Heart sounds: Normal heart sounds.  Pulmonary:     Effort: Pulmonary effort is normal.     Breath sounds: Normal breath sounds.  Abdominal:     Palpations: Abdomen is soft.     Tenderness: There is abdominal tenderness.     Comments: Mild generalized tenderness.   Musculoskeletal:     Cervical back: Neck supple.     Right lower leg: No edema.     Left lower leg: No edema.  Skin:    General: Skin is warm and dry.     Findings: No erythema or rash.  Neurological:     Mental Status: She is alert and oriented to  person, place, and time.  Psychiatric:        Mood and Affect: Mood and affect normal.  Behavior: Behavior normal.     ED Results / Procedures / Treatments   Labs (all labs ordered are listed, but only abnormal results are displayed) Labs Reviewed  COMPREHENSIVE METABOLIC PANEL - Abnormal; Notable for the following components:      Result Value   CO2 20 (*)    Glucose, Bld 332 (*)    Total Protein 8.2 (*)    Anion gap 16 (*)    All other components within normal limits  CBC - Abnormal; Notable for the following components:   WBC 10.6 (*)    RBC 5.64 (*)    MCV 77.1 (*)    MCH 24.5 (*)    All other components within normal limits  BASIC METABOLIC PANEL - Abnormal; Notable for the following components:   Sodium 133 (*)    Chloride 97 (*)    Glucose, Bld 317 (*)    All other components within normal limits  BETA-HYDROXYBUTYRIC ACID - Abnormal; Notable for the following components:   Beta-Hydroxybutyric Acid 1.90 (*)    All other components within normal limits  CBG MONITORING, ED - Abnormal; Notable for the following components:   Glucose-Capillary 440 (*)    All other components within normal limits  I-STAT VENOUS BLOOD GAS, ED - Abnormal; Notable for the following components:   pH, Ven 7.433 (*)    pCO2, Ven 37.7 (*)    pO2, Ven 56.0 (*)    Calcium, Ion 1.14 (*)    All other components within normal limits  RESP PANEL BY RT-PCR (FLU A&B, COVID) ARPGX2  LIPASE, BLOOD  URINALYSIS, ROUTINE W REFLEX MICROSCOPIC  LAMOTRIGINE LEVEL  RAPID URINE DRUG SCREEN, HOSP PERFORMED  I-STAT BETA HCG BLOOD, ED ([facility], [facility], [facility] ONLY)  CBG MONITORING, ED  TROPONIN I (HIGH SENSITIVITY)  TROPONIN I (HIGH SENSITIVITY)    EKG EKG Interpretation  Date/Time:  Friday January 13 2020 23:11:51 EST Ventricular Rate:  119 PR Interval:  132 QRS Duration: 80 QT Interval:  330 QTC Calculation: 464 R Axis:   57 Text Interpretation: Sinus tachycardia Right atrial enlargement Borderline ECG  When compared with ECG of 01/01/2020, No significant change was found Confirmed by Dione Booze ([id number]) on 01/14/2020 3:37:27 AM   Radiology No results found.  Procedures .Critical Care Performed by: Jeannie Fend, PA-C Authorized by: Jeannie Fend, PA-C   Critical care provider statement:    Critical care time (minutes):  45   Critical care was time spent personally by me on the following activities:  Discussions with consultants, evaluation of patient's response to treatment, examination of patient, ordering and performing treatments and interventions, ordering and review of laboratory studies, ordering and review of radiographic studies, pulse oximetry, re-evaluation of patient's condition, obtaining history from patient or surrogate and review of old charts   (including critical care time)  Medications Ordered in ED Medications  insulin regular, human (MYXREDLIN) 100 units/ 100 mL infusion (has no administration in time range)  lactated ringers infusion (has no administration in time range)  dextrose 5 % in lactated ringers infusion (has no administration in time range)  dextrose 50 % solution 0-50 mL (has no administration in time range)  scopolamine (TRANSDERM-SCOP) 1 MG/3DAYS 1.5 mg (has no administration in time range)  sodium chloride 0.9 % bolus 1,000 mL (1,000 mLs Intravenous New Bag/Given 01/14/20 1344)  sodium chloride 0.9 % bolus 1,000 mL (1,000 mLs Intravenous New Bag/Given 01/14/20 1345)  promethazine (PHENERGAN) injection 25 mg (25 mg Intravenous Given 01/14/20 1343)  HYDROmorphone (DILAUDID) injection 0.5 mg (0.5 mg Intravenous Given 01/14/20 1437)    ED Course  I have reviewed the triage vital signs and the nursing notes.  Pertinent labs & imaging results that were available during my care of the patient were reviewed by me and considered in my medical decision making (see chart for details).  Clinical Course as of 01/14/20 1601  Sat Jan 14, 2020  1130 Diabetes  insipidus disorder with vomiting x3 days, unable to tolerate.  Patient was given Zofran, continues to have nonbloody, nonbilious emesis.  Found to have mild generalized abdominal tenderness. Labs reviewed from initial presentation in the ED, concern for DKA with blood glucose of 332 with bicarb 20 anion gap of 16.  Ordered repeat labs due to weight and ongoing vomiting.  Plan is for repeat ultrasound, IV fluids with likely insulin, possible admission. [LM]  1304 VBG returns with pH of 7.433. [LM]  1304 Repeat BMP with improvement in CO2 and anion gap (now both WNL) despite ongoing vomiting and prolonged wait in the lobby.  [place] IV obtained. Plan is to give IV fluids and PO challenge, reassess. Discussed with Dr. Julieanne Manson, ER attending, agrees with plan, will hold of on insulin for now.  [LM]  1528 Patient continues to vomit.  Plan is to consult for admission for intractable vomiting with borderline DKA. [LM]  1533 Case discussed with Dr. Katrinka Blazing who will consult for admission, requests order for scopolamine patch. [LM]    Clinical Course User Index [LM] Alden Hipp   MDM Rules/Calculators/A&P                          Final Clinical Impression(s) / ED Diagnoses Final diagnoses:  Diabetic ketoacidosis without coma associated with type 2 diabetes mellitus (HCC)  Intractable vomiting with nausea, unspecified vomiting type    Rx / DC Orders ED Discharge Orders    None       Jeannie Fend, PA-C 01/14/20 1601    Tegeler, Canary Brim, MD 01/14/20 [phone]

## 2020-01-14 NOTE — H&P (Addendum)
History and Physical    Nancy [name]:[id number] DOB: April 04, 2000 DOA: 01/13/2020  Referring MD/NP/PA: Floreen Comber, PA-C PCP: Fleet Contras, MD  Patient coming from: Home  Chief Complaint: Abdominal pain with nausea and vomiting  I have personally briefly reviewed patient's old medical records in [facility]   HPI: Nancy [name] is a 20 y.o. 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  She has been unable to keep any food or medications down or able to sleep due to her symptoms. Complains of chronically having constipation, but had a small bowel movement that was formed yesterday.  She states that she currently smokes cigarettes.  Her previous drug screen came back positive for marijuana because she had been doing edibles, but had not done any since that time.  ED Course: Upon admission to the emergency department patient was seen to be afebrile with tachycardic and tachypneic.  Labs significant for WBC 10.6, CO2 22, glucose 317, beta hydroxybutyrate was 1.9, and anion gap 14.  Venous pH was 7.433.  Influenza and COVID-19 screening were negative.  She is given 2 L of IV fluids, antiemetics, pain medication, and had been started on insulin drip per protocol.  TRH called to admit.  Review of Systems  Constitutional: Positive for malaise/fatigue. Negative for fever.  HENT: Negative for hearing loss and nosebleeds.   Eyes: Negative for photophobia and pain.  Respiratory: Negative for cough and shortness of breath.   Cardiovascular: Positive for chest pain.  Gastrointestinal: Positive for abdominal pain, constipation, nausea and vomiting. Negative for diarrhea.   Genitourinary: Negative for dysuria and hematuria.  Skin: Negative for rash.  Neurological: Negative for focal weakness and loss of consciousness.  Psychiatric/Behavioral: Negative for substance abuse. The patient has insomnia.     Past Medical History:  Diagnosis Date  . Acanthosis nigricans   . Anxiety   . CHF (congestive heart failure) (HCC)   . Chronic lower back pain   . Depression   . DKA, type 1 (HCC) 09/13/2018  . Dyspepsia   . Obesity   . Ovarian cyst    pt is not aware of this hx (11/24/2017)  . 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 Premature baby   . Seizures (HCC)   . Type II diabetes mellitus (HCC)    insulin dependant    Past Surgical History:  Procedure Laterality Date  . ABDOMINAL HERNIA REPAIR     "I was a baby"  . BIOPSY  10/12/2018   Procedure: BIOPSY;  Surgeon: Lynann Bologna, MD;  Location: [facility] [place] ENDOSCOPY;  Service: Endoscopy;;  . ESOPHAGOGASTRODUODENOSCOPY (EGD) WITH PROPOFOL N/A 10/12/2018   Procedure: ESOPHAGOGASTRODUODENOSCOPY (EGD) WITH PROPOFOL;  Surgeon: Lynann Bologna, MD;  Location: [facility] ENDOSCOPY;  Service: Endoscopy;  Laterality: N/A;  . HERNIA REPAIR    . LEFT HEART CATH AND CORONARY ANGIOGRAPHY N/A 10/13/2018   Procedure: LEFT HEART CATH AND CORONARY ANGIOGRAPHY;  Surgeon: Kathleene Hazel, MD;  Location: [facility] INVASIVE CV LAB;  Service: Cardiovascular;  Laterality: N/A;  . TONSILLECTOMY AND ADENOIDECTOMY    . WISDOM TOOTH EXTRACTION  2017     reports that she has never smoked. She has never used smokeless tobacco. She reports that she does not drink alcohol and does not use drugs.  Allergies  Allergen  Reactions  . Tomato Anaphylaxis  . Ibuprofen Other (See Comments)    GI MD said to not take this anymore  . Oatmeal Rash    Family History  Problem Relation Age of Onset  . Diabetes Mother   . Hypertension Mother   . Obesity Mother   . Asthma Mother   . Allergic rhinitis Mother   . Eczema Mother   . Cervical cancer Mother   . Diabetes  Father   . Hypertension Father   . Obesity Father   . Hyperlipidemia Father   . Hypertension Paternal Aunt   . Hypertension Maternal Grandfather   . Colon cancer Maternal Grandfather   . Diabetes Paternal Grandmother   . Obesity Paternal Grandmother   . Diabetes Paternal Grandfather   . Obesity Paternal Grandfather   . Angioedema Neg Hx   . Immunodeficiency Neg Hx   . Urticaria Neg Hx   . Stomach cancer Neg Hx   . Esophageal cancer Neg Hx     Prior to Admission medications   Medication Sig Start Date End Date Taking? Authorizing Provider  ARIPiprazole (ABILIFY) 10 MG tablet Take 10 mg by mouth in the morning and at bedtime.    Yes [provider]  atorvastatin (LIPITOR) 20 MG tablet Take 1 tablet (20 mg total) by mouth daily at 6 PM. Patient taking differently: Take 20 mg by mouth daily. 10/05/18  Yes Storm Frisk, MD  busPIRone (BUSPAR) 10 MG tablet Take 10 mg by mouth 2 (two) times daily.  11/19/18  Yes [provider]  Cyanocobalamin (VITAMIN B-12 PO) Take 1 tablet by mouth daily.   Yes [provider]  diclofenac Sodium (VOLTAREN) 1 % GEL Apply 1 application topically 2 (two) times daily as needed (chest pains).   Yes [provider]  hydrOXYzine (ATARAX/VISTARIL) 50 MG tablet Take 50 mg by mouth every morning.   Yes [provider]  insulin aspart (NOVOLOG FLEXPEN) 100 UNIT/ML FlexPen Inject 15 Units into the skin See admin instructions. Inject 15 units subcutaneously up to three times daily after meals   Yes [provider]  insulin glargine (LANTUS) 100 unit/mL SOPN Inject 70 Units into the skin at bedtime.   Yes [provider]  lamoTRIgine (LAMICTAL) 25 MG tablet Take 2 tablets (50 mg total) by mouth 2 (two) times daily. For mood stabilization Patient taking differently: Take 25 mg by mouth 2 (two) times daily. For mood stabilization 11/11/18  Yes Nwoko, Agnes I, NP  lidocaine (LIDODERM) 5 % Place 1 patch onto  the skin daily as needed (pain). Remove & Discard patch within 12 hours or as directed by MD   Yes [provider]  lubiprostone (AMITIZA) 24 MCG capsule Take 1 capsule (24 mcg total) by mouth 2 (two) times daily with a meal. 10/27/18  Yes Esterwood, Amy S, PA-C  metoCLOPramide (REGLAN) 5 MG tablet Take 1 tablet (5 mg total) by mouth 3 (three) times daily. 10/03/19  Yes Jerald Kief, MD  mirtazapine (REMERON) 7.5 MG tablet Take 1 tablet (7.5 mg total) by mouth at bedtime. For depression/sleep 11/11/18  Yes Armandina Stammer I, NP  Multiple Vitamin (MULTIVITAMIN WITH MINERALS) TABS tablet Take 1 tablet by mouth daily. 09/18/18  Yes Clapacs, Jackquline Denmark, MD  Olopatadine HCl (PAZEO) 0.7 % SOLN Place 2 drops into both eyes 2 (two) times daily as needed (allergies).   Yes [provider]  pantoprazole (PROTONIX) 40 MG tablet Take 1 tablet (40 mg total) by mouth  2 (two) times daily. For acid reflux 01/29/19  Yes Jae Dire, MD  polyethylene glycol (MIRALAX / GLYCOLAX) 17 g packet Take 17 g by mouth daily. 10/03/19  Yes Jerald Kief, MD  prochlorperazine (COMPAZINE) 25 MG suppository Place 1 suppository (25 mg total) rectally every 12 (twelve) hours as needed for nausea or vomiting. 12/05/18  Yes Melene Plan, DO  propranolol (INDERAL) 20 MG tablet Take 1 tablet (20 mg total) by mouth 2 (two) times daily. For anxiety/HTN 12/23/18  Yes Swayze, Ava, DO  QUEtiapine (SEROQUEL) 100 MG tablet Take 300 mg by mouth at bedtime. 07/05/19  Yes [provider]  QUEtiapine (SEROQUEL) 25 MG tablet Take 25 mg by mouth every morning.   Yes [provider]  sucralfate (CARAFATE) 1 GM/10ML suspension Take 1 g by mouth at bedtime as needed (stomach pain). 07/05/19  Yes [provider]  topiramate (TOPAMAX) 25 MG tablet Take 1 tablet (25 mg total) by mouth 2 (two) times daily. For seizure activities 11/11/18  Yes Armandina Stammer I, NP  dicyclomine (BENTYL) 10 MG capsule Take 1 capsule (10 mg  total) by mouth 4 (four) times daily as needed for spasms. Patient not taking: No sig reported 11/21/19   Linwood Dibbles, MD    Physical Exam:  Constitutional: Obese female who has been discomfort Vitals:   01/14/20 0905 01/14/20 1023 01/14/20 1200 01/14/20 1415  BP: (!) 151/93 (!) 118/45 (!) 131/91 134/74  Pulse: (!) 133 (!) 127 (!) 135 (!) 129  Resp: 19 20 20  (!) 25  Temp: 98.7 F (37.1 C) 98.6 F (37 C)    TempSrc: Oral Oral    SpO2: 100% 97% 100% 100%   Eyes: PERRL, lids and conjunctivae normal ENMT: Mucous membranes are moist. Posterior pharynx clear of any exudate or lesions.  Neck: normal, supple, no masses, no thyromegaly Respiratory: Mildly tachypneic.  Clear to auscultation bilaterally, no wheezing, no crackles. Normal respiratory effort. No accessory muscle use.  O2 saturation maintained on room air and patient able to talk in complete sentences. Cardiovascular: Tachycardia, no murmurs / rubs / gallops. No extremity edema. 2+ pedal pulses. No carotid bruits.  Abdomen: Generalized tenderness to palpation Musculoskeletal: no clubbing / cyanosis. No joint deformity upper and lower extremities. Good ROM, no contractures. Normal muscle tone.  Skin: no rashes, lesions, ulcers. No induration Neurologic: CN 2-12 grossly intact. Sensation intact, DTR normal. Strength 5/5 in all 4.  Psychiatric: Normal judgment and insight. Alert and oriented x 3.  Anxious mood.     Labs on Admission: I have personally reviewed following labs and imaging studies  CBC: Recent Labs  Lab 01/13/20 2314 01/14/20 1244  WBC 10.6*  --   HGB 13.8 15.0  HCT 43.5 44.0  MCV 77.1*  --   PLT 362  --    Basic Metabolic Panel: Recent Labs  Lab 01/13/20 2314 01/14/20 1230 01/14/20 1244  NA 135 133* 135  K 4.0 3.9 3.9  CL 99 97*  --   CO2 20* 22  --   GLUCOSE 332* 317*  --   BUN 7 8  --   CREATININE 0.62 0.67  --   CALCIUM 9.8 9.3  --    GFR: CrCl cannot be calculated (Unknown ideal  weight.). Liver Function Tests: Recent Labs  Lab 01/13/20 2314  AST 17  ALT 15  ALKPHOS 100  BILITOT 0.9  PROT 8.2*  ALBUMIN 4.4   Recent Labs  Lab 01/13/20 2314  LIPASE 20  No results for input(s): AMMONIA in the last 168 hours. Coagulation Profile: No results for input(s): INR, PROTIME in the last 168 hours. Cardiac Enzymes: No results for input(s): CKTOTAL, CKMB, CKMBINDEX, TROPONINI in the last 168 hours. BNP (last 3 results) No results for input(s): PROBNP in the last 8760 hours. HbA1C: No results for input(s): HGBA1C in the last 72 hours. CBG: Recent Labs  Lab 01/14/20 0524  GLUCAP 440*   Lipid Profile: No results for input(s): CHOL, HDL, LDLCALC, TRIG, CHOLHDL, LDLDIRECT in the last 72 hours. Thyroid Function Tests: No results for input(s): TSH, T4TOTAL, FREET4, T3FREE, THYROIDAB in the last 72 hours. Anemia Panel: No results for input(s): VITAMINB12, FOLATE, FERRITIN, TIBC, IRON, RETICCTPCT in the last 72 hours. Urine analysis:    Component Value Date/Time   COLORURINE STRAW (A) 11/21/2019 1130   APPEARANCEUR CLEAR 11/21/2019 1130   LABSPEC 1.035 (H) 11/21/2019 1130   PHURINE 6.0 11/21/2019 1130   GLUCOSEU >=500 (A) 11/21/2019 1130   HGBUR NEGATIVE 11/21/2019 1130   BILIRUBINUR NEGATIVE 11/21/2019 1130   BILIRUBINUR negative 07/13/2018 1651   KETONESUR 80 (A) 11/21/2019 1130   PROTEINUR NEGATIVE 11/21/2019 1130   UROBILINOGEN 0.2 07/13/2018 1651   UROBILINOGEN 1.0 07/09/2017 1324   NITRITE NEGATIVE 11/21/2019 1130   LEUKOCYTESUR NEGATIVE 11/21/2019 1130   Sepsis Labs: Recent Results (from the past 240 hour(s))  Resp Panel by RT-PCR (Flu A&B, Covid) Nasopharyngeal Swab     Status: None   Collection Time: 01/14/20 12:42 PM   Specimen: Nasopharyngeal Swab; Nasopharyngeal(NP) swabs in vial transport medium  Result Value Ref Range Status   SARS Coronavirus 2 by RT PCR NEGATIVE NEGATIVE Final    Comment: (NOTE) SARS-CoV-2 target nucleic acids are NOT  DETECTED.  The SARS-CoV-2 RNA is generally detectable in upper respiratory specimens during the acute phase of infection. The lowest concentration of SARS-CoV-2 viral copies this assay can detect is 138 copies/mL. A negative result does not preclude SARS-Cov-2 infection and should not be used as the sole basis for treatment or other patient management decisions. A negative result may occur with  improper specimen collection/handling, submission of specimen other than nasopharyngeal swab, presence of viral mutation(s) within the areas targeted by this assay, and inadequate number of viral copies(<138 copies/mL). A negative result must be combined with clinical observations, patient history, and epidemiological information. The expected result is Negative.  Fact Sheet for Patients:  BloggerCourse.comhttps://www.fda.gov/media/152166/download  Fact Sheet for Healthcare Providers:  SeriousBroker.ithttps://www.fda.gov/media/152162/download  This test is no t yet approved or cleared by the [place] FDA and  has been authorized for detection and/or diagnosis of SARS-CoV-2 by FDA under an Emergency Use Authorization (EUA). This EUA will remain  in effect (meaning this test can be used) for the duration of the COVID-19 declaration under Section 564(b)(1) of the Act, 21 U.S.C.section 360bbb-3(b)(1), unless the authorization is terminated  or revoked sooner.       Influenza A by PCR NEGATIVE NEGATIVE Final   Influenza B by PCR NEGATIVE NEGATIVE Final    Comment: (NOTE) The Xpert Xpress SARS-CoV-2/FLU/RSV plus assay is intended as an aid in the diagnosis of influenza from Nasopharyngeal swab specimens and should not be used as a sole basis for treatment. Nasal washings and aspirates are unacceptable for Xpert Xpress SARS-CoV-2/FLU/RSV testing.  Fact Sheet for Patients: BloggerCourse.comhttps://www.fda.gov/media/152166/download  Fact Sheet for Healthcare Providers: SeriousBroker.ithttps://www.fda.gov/media/152162/download  This test is not yet  approved or cleared by the [place] FDA and has been authorized for detection and/or diagnosis of SARS-CoV-2  by FDA under an Emergency Use Authorization (EUA). This EUA will remain in effect (meaning this test can be used) for the duration of the COVID-19 declaration under Section 564(b)(1) of the Act, 21 U.S.C. section 360bbb-3(b)(1), unless the authorization is terminated or revoked.  Performed at [facility], [address] [place], [place] [address]      Radiological Exams on Admission: No results found.  EKG: Independently reviewed.  Sinus tachycardia 119 beats per  Assessment/Plan Intractable nausea and vomiting  General abdominal pain: Acute on chronic.  Patient presents with generalized abdominal pain with nausea and vomiting.  She does not appear to be in DKA.  Records note previous issues with large amount of stool burden with constipation which patient still complains of. On the differential previously includes gastroparesis and cannabinoid hyperemesis syndrome-Admit to a medical telemetry -Check acute abdominal series -Try a scopolamine patch -Follow-up UDS -Morphine IV as needed abdominal pain(3 doses available overnight) -Senokot-S twice daily twice daily -Reglan 5 mg 3 times daily with meals -Bentyl as needed for pain  Diabetes mellitus with hyperglycemia: Acute. Patient present with glucose elevated up to 317, without elevated anion gap and venous pH relatively within normal limits.  Home insulin regimen includes Lantus 70 units nightly and NovoLog 15 units with meals. -Discontinue insulin drip an endotool -Neuro n.p.o. and advance diet as tolerated to carb modified -Start Lantus 35 units subcu daily now as patient not tolerating p.o. -CBGs before every meal with the resistant SSI -Adjust insulin regimen as needed  SIRS: Patient was initially noted to be tachycardic and tachypneic with initially elevated at 10.6.  Suspect this is reactive in  nature. -Follow-up urinalysis and acute abd series -Recheck CBC in a.m.  Essential hypertension: Patient noted to have blood pressure elevated up to 160/95 with heart rates up into the 130s.  Home medications include propranolol 20 mg twice daily. -Restart propranolol  History of seizure disorder: Patient did not report any complaints of seizure.  -Seizure precaution -Continue regimen of phenytoin Topamax,   Anxiety/depression/history of pseudoseizures -Continue Seroquel, hydroxyzine, and buspirone  GERD -Continue Carafate and Protonix  History of polysubstance use: Patient reports intermittently smoking tobacco and previously had been edibles in the past but denies any recent use. -Continue to counsel on the need of cessation of tobacco and illicit drug   DVT prophylaxis: Lovenox Code Status: Full Family Communication: No family requested to be updated Disposition Plan: No family Consults called: none Admission status: Observation  Clydie Braun MD [facility]   If 7PM-7AM, please contact night-coverage   01/14/2020, 4:01 PM

## 2020-01-14 NOTE — ED Notes (Signed)
Pt was given an ice pack as they were hot. Pt stated they felt as though they were in DKA and we advised we can't determine that and they'd have to be seen by a provider. Pt was advised to not drink anymore soft drinks as they were drinking a soft drink and that felt like it made them worse.

## 2020-01-14 NOTE — ED Notes (Signed)
While attempting to get pt's EKG, pt had lifted there left armrest on the wheelchair and began to lean forward.This writer put the arm rest back down to make sure pt does not fall out. Once this writer turned around to pick up a electrode. Pt laid to the left of the wheelchair on her belly. There was no seizure like activity. Pt was a alert and oriented. There was no incontinence noted and pt was able to assist staff in getting back to her wheelchair.

## 2020-01-14 NOTE — ED Provider Notes (Signed)
EMERGENCY DEPARTMENT  US GUIDANCE EXAM Emergency Ultrasound:  US Guidance for Needle Guidance  INDICATIONS: Difficult vascular access Linear probe used in real-time to visualize location of needle entry through skin.   PERFORMED BY: Myself IMAGES ARCHIVED?: Yes LIMITATIONS: scar tissue VIEWS USED: Transverse INTERPRETATION: Needle visualized within vein, Right arm and Needle gauge 18    Tajae Maiolo, Canary Brim, MD 01/14/20 [phone]

## 2020-01-15 DIAGNOSIS — R1084 Generalized abdominal pain: Secondary | ICD-10-CM

## 2020-01-15 DIAGNOSIS — E1010 Type 1 diabetes mellitus with ketoacidosis without coma: Secondary | ICD-10-CM | POA: Diagnosis not present

## 2020-01-15 DIAGNOSIS — R112 Nausea with vomiting, unspecified: Secondary | ICD-10-CM

## 2020-01-15 DIAGNOSIS — F1910 Other psychoactive substance abuse, uncomplicated: Secondary | ICD-10-CM

## 2020-01-15 DIAGNOSIS — I10 Essential (primary) hypertension: Secondary | ICD-10-CM | POA: Diagnosis not present

## 2020-01-15 DIAGNOSIS — K5901 Slow transit constipation: Secondary | ICD-10-CM

## 2020-01-15 DIAGNOSIS — F1220 Cannabis dependence, uncomplicated: Secondary | ICD-10-CM

## 2020-01-15 DIAGNOSIS — R6510 Systemic inflammatory response syndrome (SIRS) of non-infectious origin without acute organ dysfunction: Secondary | ICD-10-CM

## 2020-01-15 LAB — CBC
HCT: 37.3 % (ref 36.0–46.0)
Hemoglobin: 11.7 g/dL — ABNORMAL LOW (ref 12.0–15.0)
MCH: 24.9 pg — ABNORMAL LOW (ref 26.0–34.0)
MCHC: 31.4 g/dL (ref 30.0–36.0)
MCV: 79.4 fL — ABNORMAL LOW (ref 80.0–100.0)
Platelets: 273 10*3/uL (ref 150–400)
RBC: 4.7 MIL/uL (ref 3.87–5.11)
RDW: 15.3 % (ref 11.5–15.5)
WBC: 10.6 10*3/uL — ABNORMAL HIGH (ref 4.0–10.5)
nRBC: 0 % (ref 0.0–0.2)

## 2020-01-15 LAB — BASIC METABOLIC PANEL
Anion gap: 10 (ref 5–15)
BUN: 8 mg/dL (ref 6–20)
CO2: 21 mmol/L — ABNORMAL LOW (ref 22–32)
Calcium: 8.6 mg/dL — ABNORMAL LOW (ref 8.9–10.3)
Chloride: 104 mmol/L (ref 98–111)
Creatinine, Ser: 0.61 mg/dL (ref 0.44–1.00)
GFR, Estimated: >60 mL/min (ref >60)
Glucose, Bld: 257 mg/dL — ABNORMAL HIGH (ref 70–99)
Potassium: 3.7 mmol/L (ref 3.5–5.1)
Sodium: 135 mmol/L (ref 135–145)

## 2020-01-15 LAB — RAPID URINE DRUG SCREEN, HOSP PERFORMED
Amphetamines: NOT DETECTED
Barbiturates: NOT DETECTED
Benzodiazepines: NOT DETECTED
Cocaine: NOT DETECTED
Opiates: NOT DETECTED
Tetrahydrocannabinol: POSITIVE — AB

## 2020-01-15 LAB — GLUCOSE, CAPILLARY
Glucose-Capillary: 167 mg/dL — ABNORMAL HIGH (ref 70–99)
Glucose-Capillary: 156 mg/dL — ABNORMAL HIGH (ref 70–99)
Glucose-Capillary: 118 mg/dL — ABNORMAL HIGH (ref 70–99)

## 2020-01-15 LAB — CBG MONITORING, ED
Glucose-Capillary: 201 mg/dL — ABNORMAL HIGH (ref 70–99)
Glucose-Capillary: 166 mg/dL — ABNORMAL HIGH (ref 70–99)

## 2020-01-15 LAB — BETA-HYDROXYBUTYRIC ACID: Beta-Hydroxybutyric Acid: 0.33 mmol/L — ABNORMAL HIGH (ref 0.05–0.27)

## 2020-01-15 LAB — HIV ANTIBODY (ROUTINE TESTING W REFLEX): HIV Screen 4th Generation wRfx: NONREACTIVE

## 2020-01-15 IMAGING — CT CT CERVICAL SPINE W/O CM
2 series · 14 of 27 positions shown, 18 images · non-contrast
Comparison: Cervical spine CT October 30, 2018; head CT November 30, 2018; brain MRI December 01, 2018

CLINICAL DATA: Multiple seizures with fall.  Neck pain.

EXAM:
CT HEAD WITHOUT CONTRAST
CT CERVICAL SPINE WITHOUT CONTRAST
TECHNIQUE: Multidetector CT imaging of the head and cervical spine was
performed following the standard protocol without intravenous
contrast. Multiplanar CT image reconstructions of the cervical spine
were also generated.

[Series 3: c spine bone · axial · 0.29mm/px · z∈[-237,-99]mm · 9 of 83 slices shown, 12 images]
[im 7/83  soft-tissue]
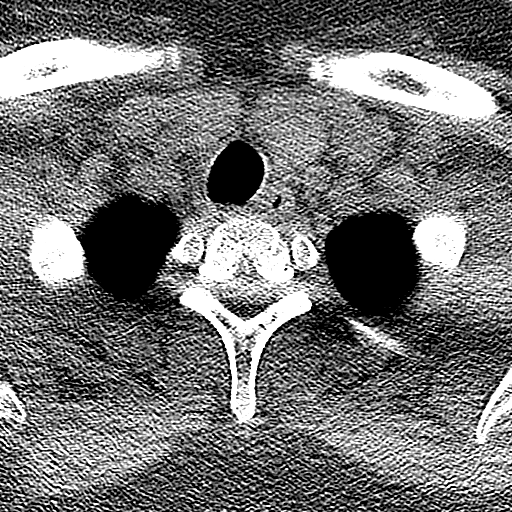
[im 7/83  bone]
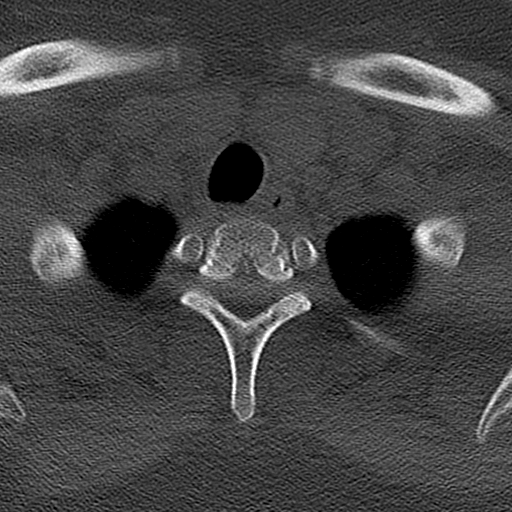
[im 19/83  bone]
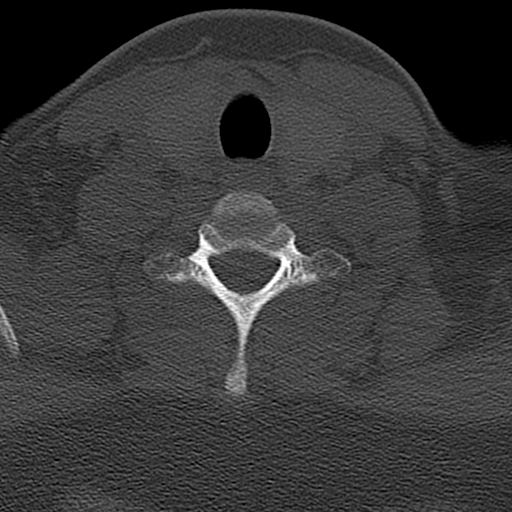
[im 26/83  bone]
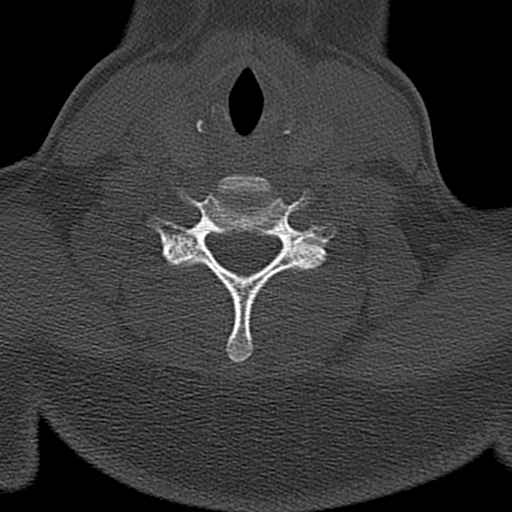
[im 32/83  bone]
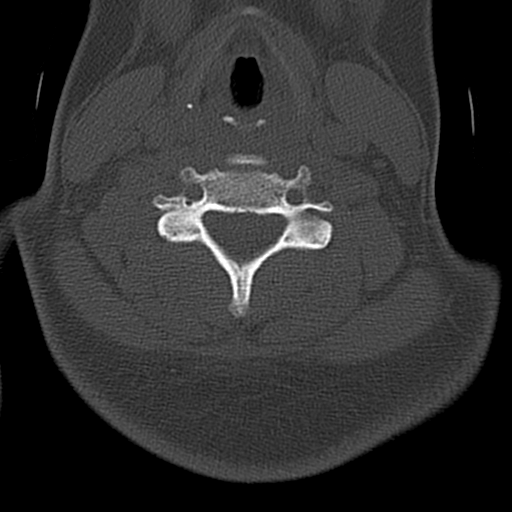
[im 45/83  soft-tissue]
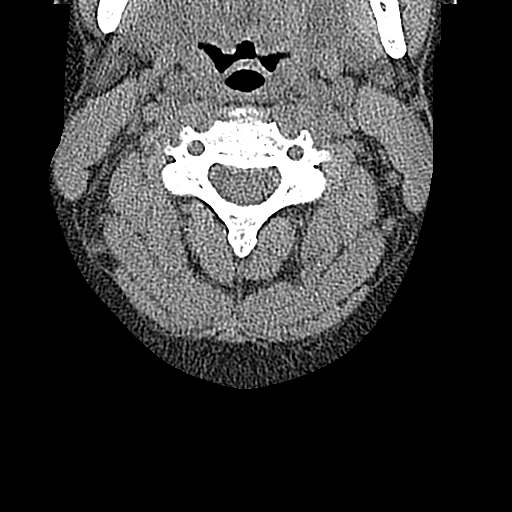
[im 45/83  bone]
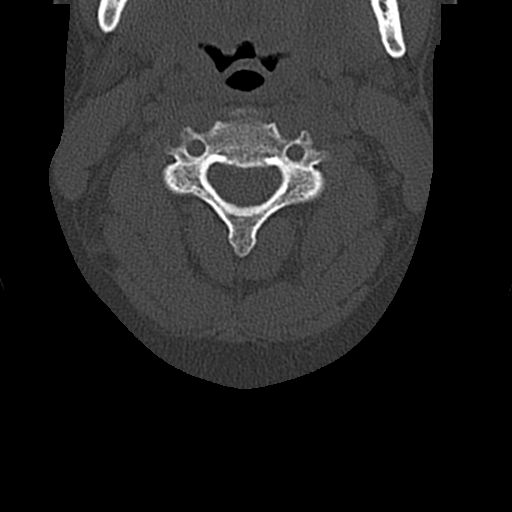
[im 51/83  bone]
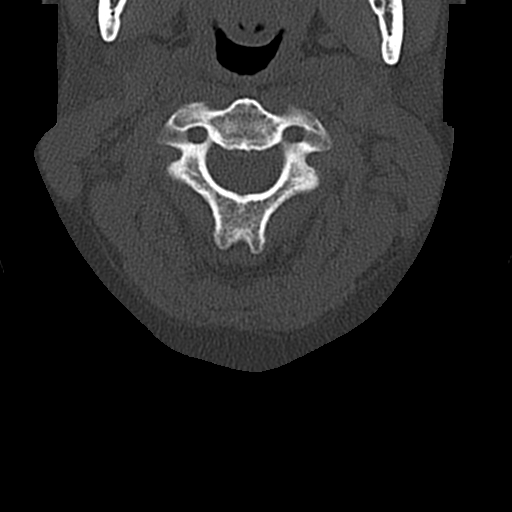
[im 57/83  bone]
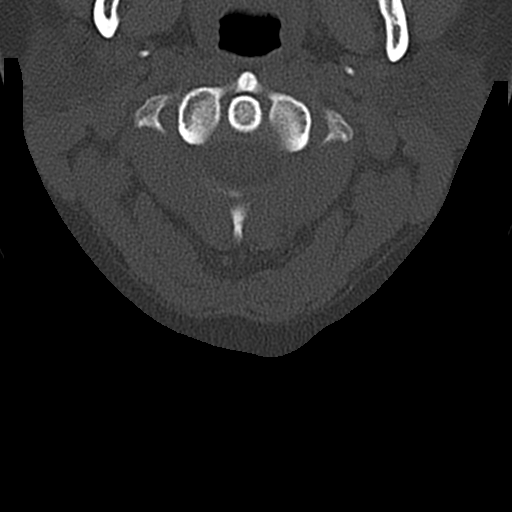
[im 70/83  bone]
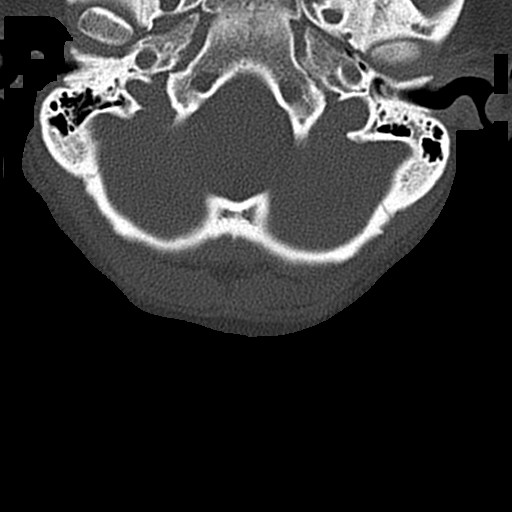
[im 76/83  soft-tissue]
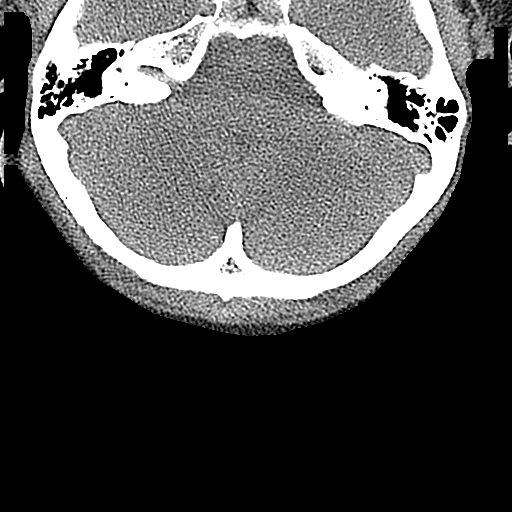
[im 76/83  bone]
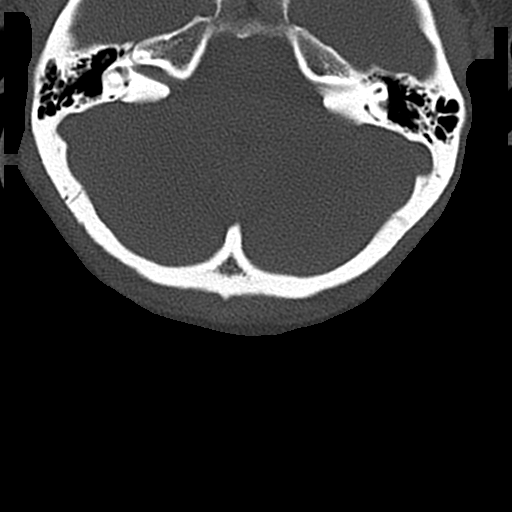

[Series 7: sagittal bone · sagittal · 0.32mm/px · 5 of 52 slices shown, 6 images]
[im 18/52  bone]
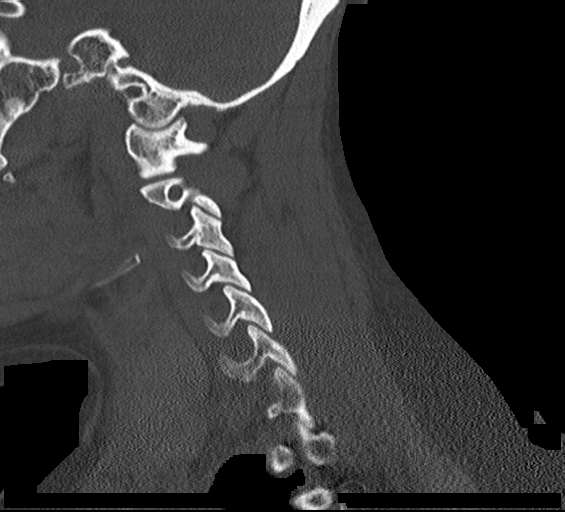
[im 22/52  bone]
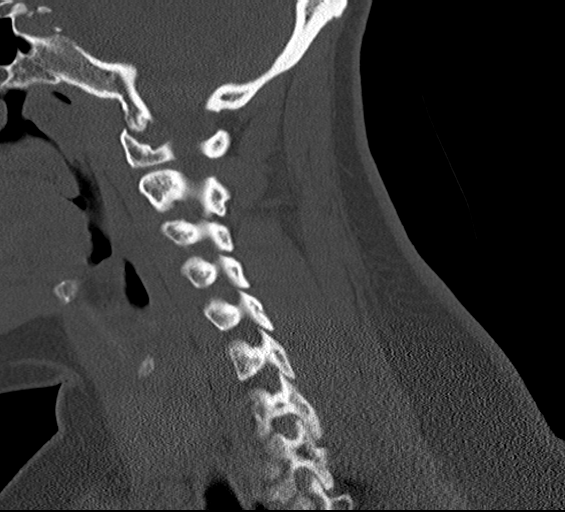
[im 26/52  soft-tissue]
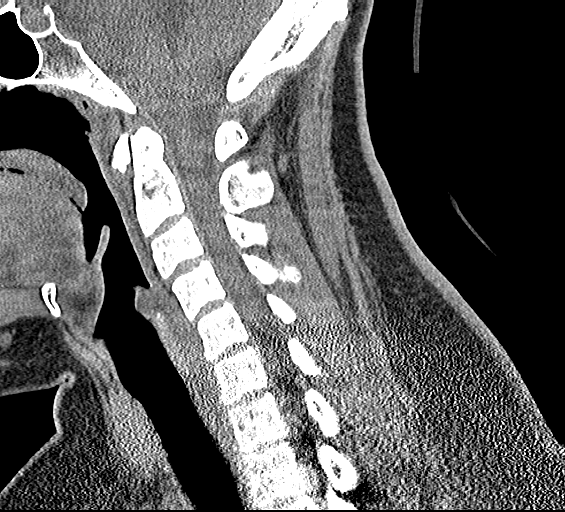
[im 26/52  bone]
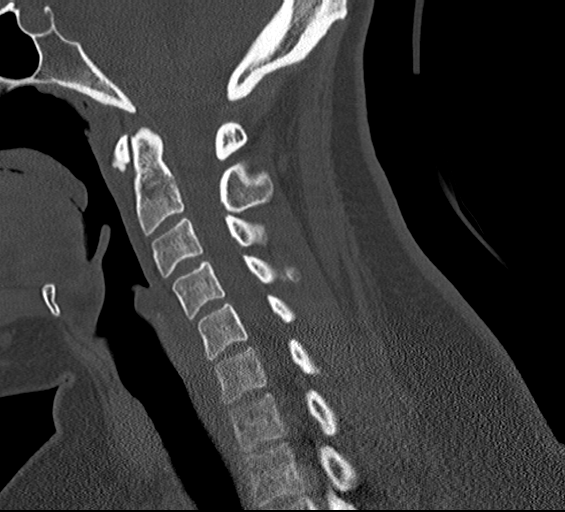
[im 30/52  bone]
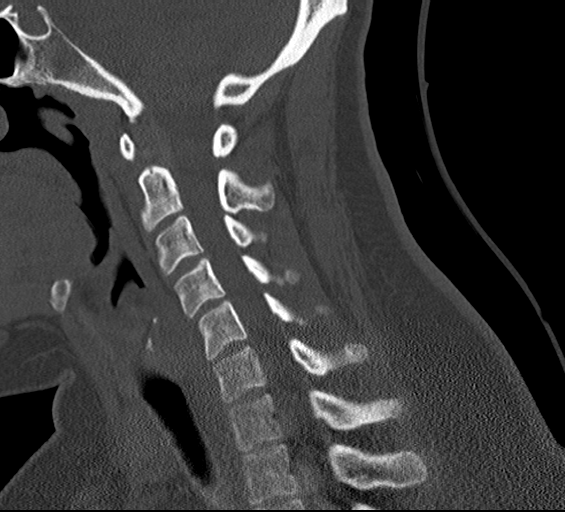
[im 35/52  bone]
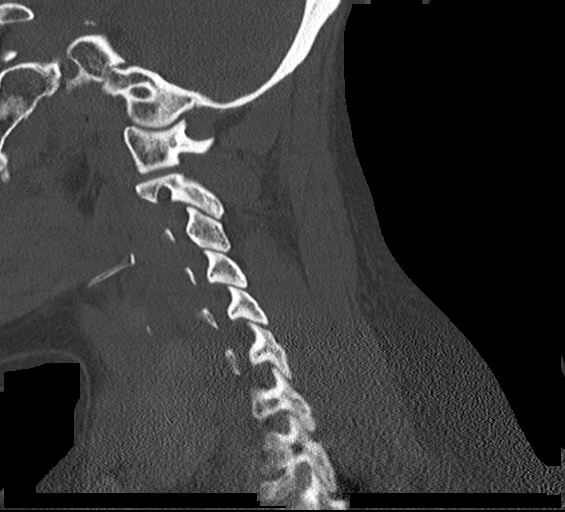

[14 of 27 positions shown; findings below may reference images not displayed]

FINDINGS: CT HEAD FINDINGS

Brain: The ventricles and sulci appear stable and within normal
limits. There is no intracranial mass, hemorrhage, extra-axial fluid
collection or midline shift. The brain parenchyma appears
unremarkable. No demonstrable acute infarct.

Vascular: No hyperdense vessel. No vascular calcifications are
evident.

Skull: The bony calvarium appears intact.

Sinuses/Orbits: There is slight mucosal thickening in several
ethmoid air cells. Other visualized paranasal sinuses are clear.
Frontal sinuses are aplastic. Orbits appear symmetric bilaterally.

Other: Mastoid air cells are clear.

CT CERVICAL SPINE FINDINGS

Alignment: There is no demonstrable spondylolisthesis.

Skull base and vertebrae: Skull base and craniocervical junction
regions appear normal. No evident fracture. No blastic or lytic
lesions.

Soft tissues and spinal canal: Prevertebral soft tissues and
predental space regions are normal. There is no cord or canal
hematoma. No paraspinous lesions are evident.

Disc levels: Disc spaces appear normal. No appreciable facet
arthropathy. No nerve root edema or effacement. No disc extrusion or
stenosis.

Upper chest: Visualized upper lung regions are clear.

Other: None
IMPRESSION: CT head: Slight mucosal thickening in several ethmoid air cells.
Study otherwise unremarkable.

CT cervical spine: No fracture or spondylolisthesis. No appreciable
arthropathy. No nerve root edema or effacement. No disc extrusion or
stenosis.

## 2020-01-15 IMAGING — CT CT HEAD W/O CM
3 series · 14 of 47 positions shown, 16 images · non-contrast
Comparison: Cervical spine CT October 30, 2018; head CT November 30, 2018; brain MRI December 01, 2018

CLINICAL DATA: Multiple seizures with fall.  Neck pain.

EXAM:
CT HEAD WITHOUT CONTRAST
CT CERVICAL SPINE WITHOUT CONTRAST
TECHNIQUE: Multidetector CT imaging of the head and cervical spine was
performed following the standard protocol without intravenous
contrast. Multiplanar CT image reconstructions of the cervical spine
were also generated.

[Series 3: head wo · axial · 0.47mm/px · z∈[-97,+28]mm · 8 of 31 slices shown, 10 images]
[im 3/31  brain]
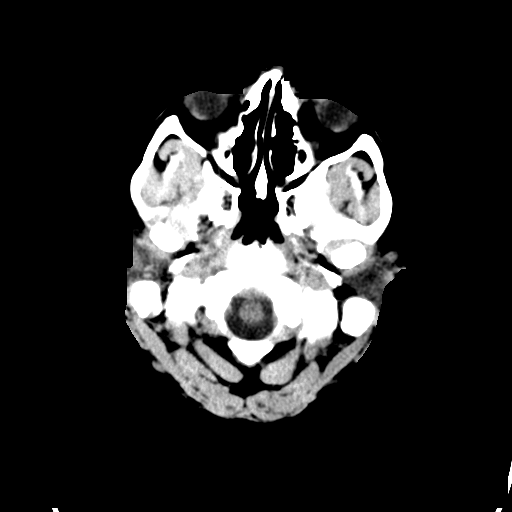
[im 3/31  bone]
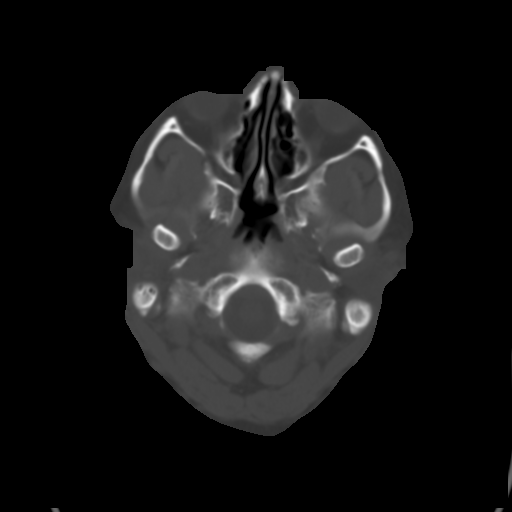
[im 7/31  brain]
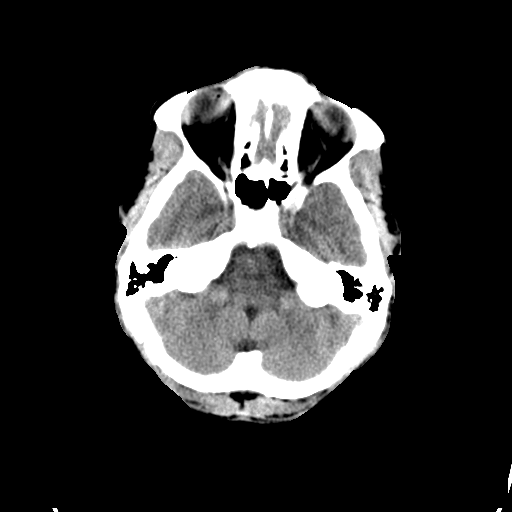
[im 10/31  brain]
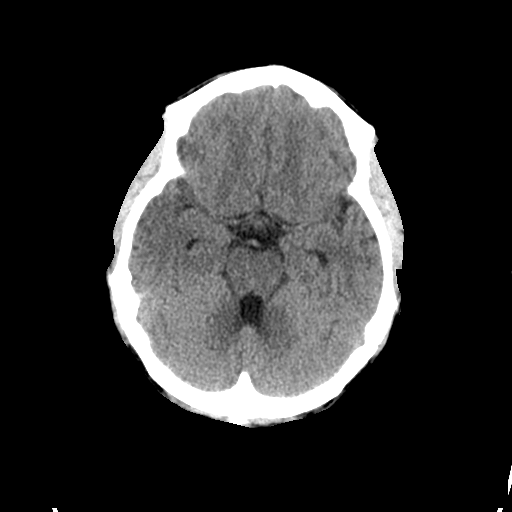
[im 14/31  brain]
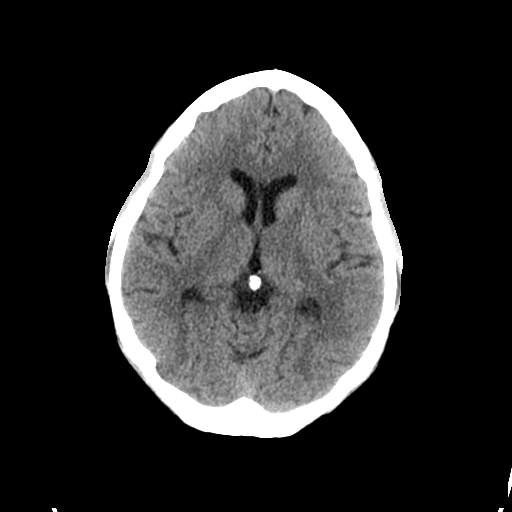
[im 17/31  brain]
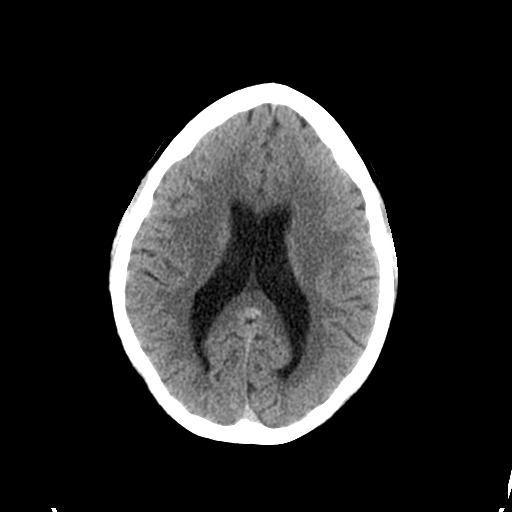
[im 17/31  bone]
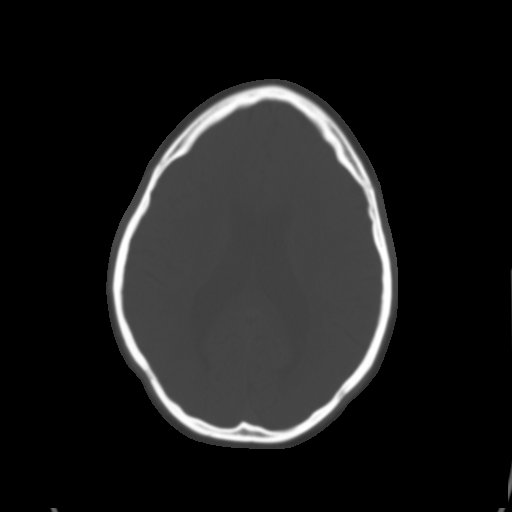
[im 21/31  brain]
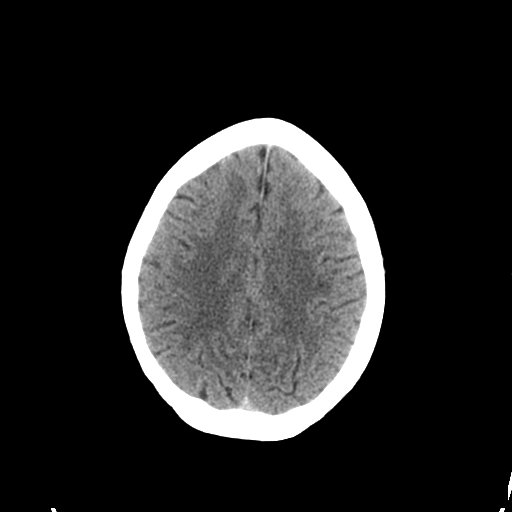
[im 24/31  brain]
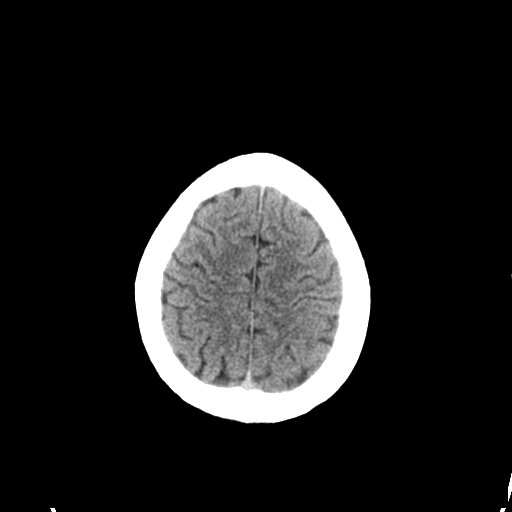
[im 28/31  brain]
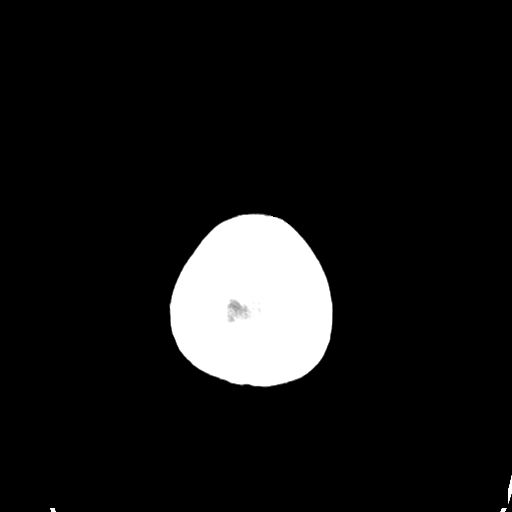

[Series 5: coronal soft tissue · coronal · 0.33mm/px · 3 of 60 slices shown]
[im 20/60  brain]
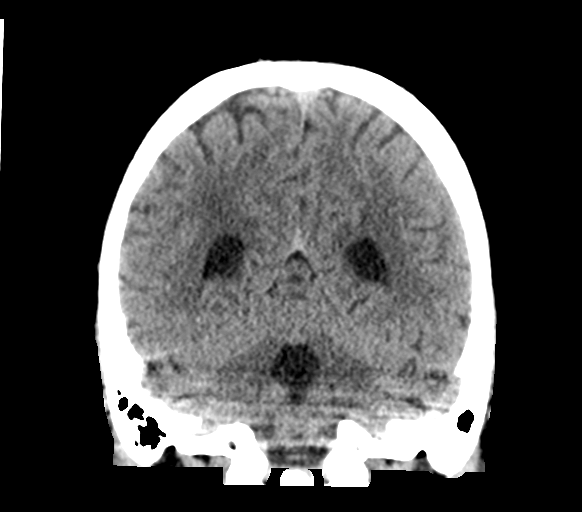
[im 27/60  brain]
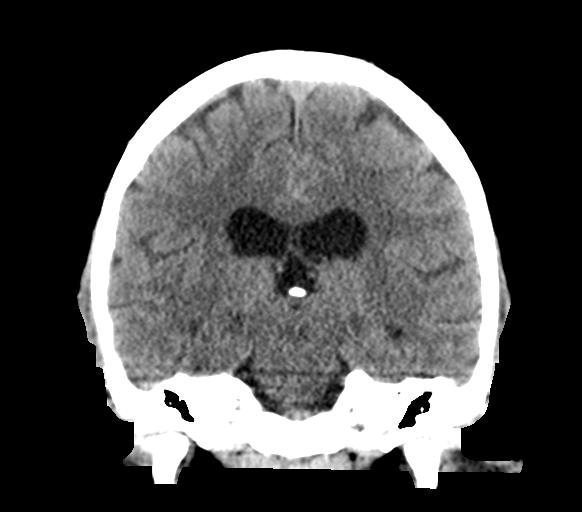
[im 33/60  brain]
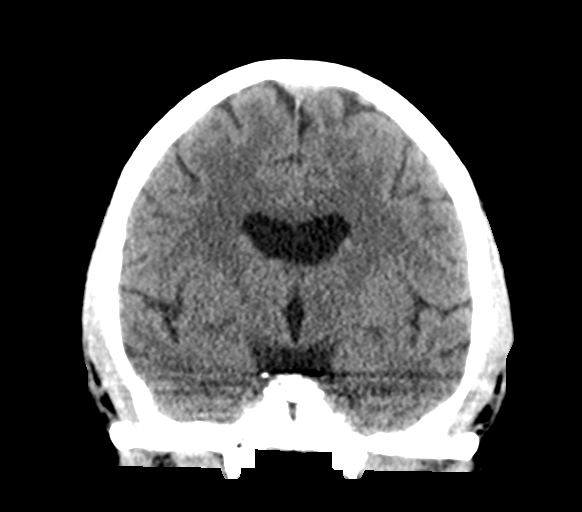

[Series 6: sagittal soft tissue · sagittal · 0.34mm/px · 3 of 46 slices shown]
[im 16/46  brain]
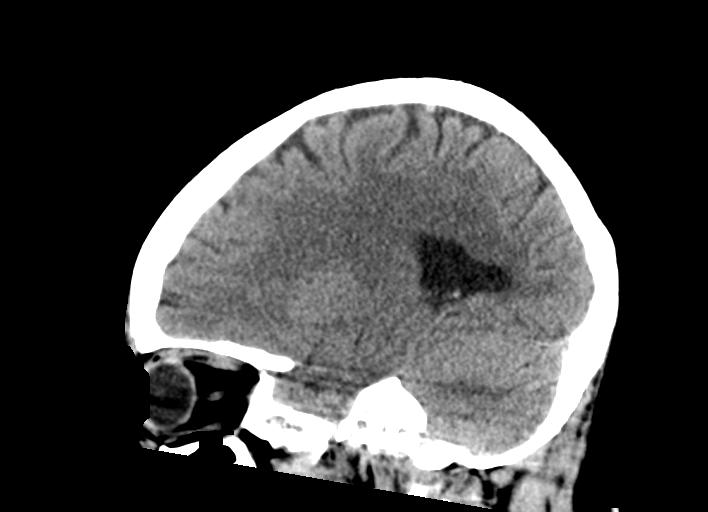
[im 23/46  brain]
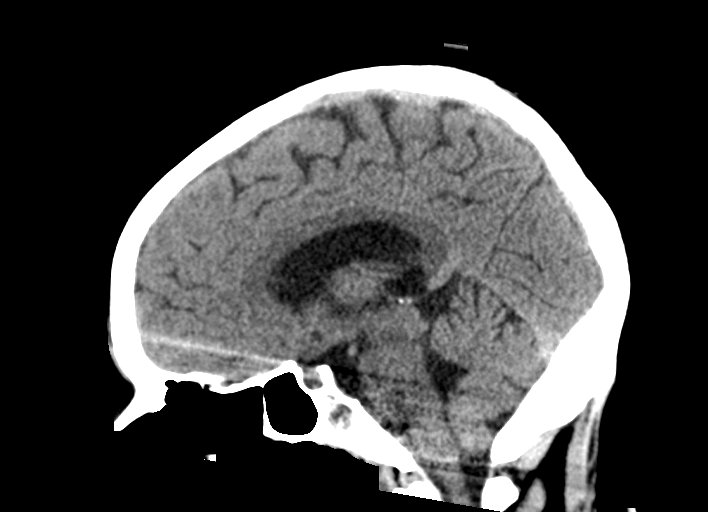
[im 31/46  brain]
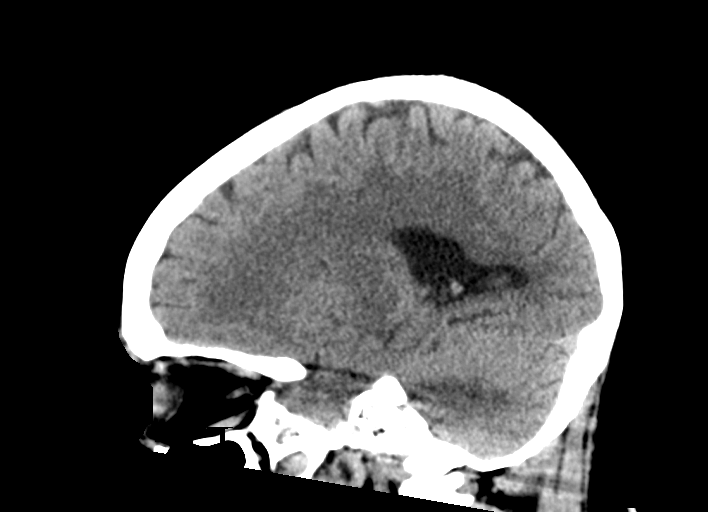

[14 of 47 positions shown; findings below may reference images not displayed]

FINDINGS: CT HEAD FINDINGS

Brain: The ventricles and sulci appear stable and within normal
limits. There is no intracranial mass, hemorrhage, extra-axial fluid
collection or midline shift. The brain parenchyma appears
unremarkable. No demonstrable acute infarct.

Vascular: No hyperdense vessel. No vascular calcifications are
evident.

Skull: The bony calvarium appears intact.

Sinuses/Orbits: There is slight mucosal thickening in several
ethmoid air cells. Other visualized paranasal sinuses are clear.
Frontal sinuses are aplastic. Orbits appear symmetric bilaterally.

Other: Mastoid air cells are clear.

CT CERVICAL SPINE FINDINGS

Alignment: There is no demonstrable spondylolisthesis.

Skull base and vertebrae: Skull base and craniocervical junction
regions appear normal. No evident fracture. No blastic or lytic
lesions.

Soft tissues and spinal canal: Prevertebral soft tissues and
predental space regions are normal. There is no cord or canal
hematoma. No paraspinous lesions are evident.

Disc levels: Disc spaces appear normal. No appreciable facet
arthropathy. No nerve root edema or effacement. No disc extrusion or
stenosis.

Upper chest: Visualized upper lung regions are clear.

Other: None
IMPRESSION: CT head: Slight mucosal thickening in several ethmoid air cells.
Study otherwise unremarkable.

CT cervical spine: No fracture or spondylolisthesis. No appreciable
arthropathy. No nerve root edema or effacement. No disc extrusion or
stenosis.

## 2020-01-15 MED ORDER — DICYCLOMINE HCL 20 MG PO TABS
20.0000 mg | ORAL_TABLET | Freq: Three times a day (TID) | ORAL | Status: DC
Start: 2020-01-15 — End: 2020-01-16
  Administered 2020-01-15 – 2020-01-16 (×4): 20 mg via ORAL
  Filled 2020-01-15 (×4): qty 1

## 2020-01-15 MED ORDER — PROMETHAZINE HCL 25 MG/ML IJ SOLN
12.5000 mg | Freq: Once | INTRAMUSCULAR | Status: AC
  Administered 2020-01-15: 12.5 mg via INTRAVENOUS
  Filled 2020-01-15: qty 1

## 2020-01-15 MED ORDER — METOCLOPRAMIDE HCL 5 MG/ML IJ SOLN
10.0000 mg | Freq: Four times a day (QID) | INTRAMUSCULAR | Status: DC | PRN
  Administered 2020-01-15 – 2020-01-16 (×3): 10 mg via INTRAVENOUS
  Filled 2020-01-15 (×3): qty 2

## 2020-01-15 MED ORDER — INSULIN GLARGINE 100 UNIT/ML ~~LOC~~ SOLN
45.0000 [IU] | Freq: Every day | SUBCUTANEOUS | Status: DC
  Administered 2020-01-15: 45 [IU] via SUBCUTANEOUS
  Filled 2020-01-15 (×2): qty 0.45

## 2020-01-15 MED ORDER — POLYETHYLENE GLYCOL 3350 17 G PO PACK
17.0000 g | PACK | Freq: Every day | ORAL | Status: DC
  Administered 2020-01-15 – 2020-01-16 (×2): 17 g via ORAL
  Filled 2020-01-15 (×2): qty 1

## 2020-01-15 MED ORDER — MORPHINE SULFATE (PF) 4 MG/ML IV SOLN
4.0000 mg | Freq: Once | INTRAVENOUS | Status: AC
Start: 2020-01-15 — End: 2020-01-15
  Administered 2020-01-15: 4 mg via INTRAVENOUS
  Filled 2020-01-15: qty 1

## 2020-01-15 MED ORDER — INSULIN GLARGINE 100 UNIT/ML ~~LOC~~ SOLN
40.0000 [IU] | Freq: Every day | SUBCUTANEOUS | Status: DC
  Filled 2020-01-15: qty 0.4

## 2020-01-15 MED ORDER — LIDOCAINE 5 % EX PTCH
1.0000 | MEDICATED_PATCH | CUTANEOUS | Status: DC
  Administered 2020-01-15 – 2020-01-16 (×2): 1 via TRANSDERMAL
  Filled 2020-01-15: qty 1

## 2020-01-15 MED ORDER — LACTULOSE 10 GM/15ML PO SOLN
20.0000 g | Freq: Three times a day (TID) | ORAL | Status: DC
  Administered 2020-01-15 – 2020-01-16 (×4): 20 g via ORAL
  Filled 2020-01-15 (×8): qty 30

## 2020-01-15 NOTE — Progress Notes (Signed)
PROGRESS NOTE  Nancy [name] DJM:[id number] DOB: 30-Oct-2000 DOA: 01/13/2020 PCP: Nancy Contras, MD  Brief History   Nancy [name] is a 20 y.o. 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  She has been unable to keep any food or medications down or able to sleep due to her symptoms. Complains of chronically having constipation, but had a small bowel movement that was formed yesterday.  She states that she currently smokes cigarettes.  Her previous drug screen came back positive for marijuana because she had been doing edibles, but had not done any since that time.  ED Course: Upon admission to the emergency department patient was seen to be afebrile with tachycardic and tachypneic.  Labs significant for WBC 10.6, CO2 22, glucose 317, beta hydroxybutyrate was 1.9, and anion gap 14.  Venous pH was 7.433.  Influenza and COVID-19 screening were negative.  She is given 2 L of IV fluids, antiemetics, pain medication, and had been started on insulin drip per protocol.  [facility] called to admit.  [facility] were consulted to admit the patient for further evaluation and care.  She was made NPO although it seems that she was fed in the ED. She has continued to vomit and have abdominal pain and nausea.  X-ray of the abdomen and pelvis demonstrated significant stool burden, although the patient states that she had a BM yesterday.  Consultants  . None  Procedures  . None  Antibiotics   Anti-infectives (From admission, onward)   None    .  Subjective  The patient is resting in bed. She continues to complain of nausea and vomiting.  Objective   Vitals:  Vitals:   01/15/20 1200  01/15/20 1400  BP: 135/84 (!) 141/106  Pulse: 97 92  Resp: 18 20  Temp:  99.1 F (37.3 C)  SpO2: 100% 100%   Exam:  Constitutional:  . The patient is awake, alert, and oriented x 3. No acute distress. Respiratory:  . No increased work of breathing. . No wheezes, rales, or rhonchi . No tactile fremitus Cardiovascular:  . Regular rate and rhythm . No murmurs, ectopy, or gallups. . No lateral PMI. No thrills. Abdomen:  . Abdomen is soft, non-tender, non-distended . No hernias, masses, or organomegaly . Hypoactive bowel sounds.  Musculoskeletal:  . No cyanosis, clubbing, or edema Skin:  . No rashes, lesions, ulcers . palpation of skin: no induration or nodules Neurologic:  . CN 2-12 intact . Sensation all 4 extremities intact Psychiatric:  . Mental status o Mood, affect appropriate o Orientation to person, place, time  . judgment and insight appear intact  I have personally reviewed the following:   Today's Data  . Vitals, CBC, BMP, Beta hydroxybutyric acid  Imaging  . X-ray abdomen  Scheduled Meds: . atorvastatin  20 mg Oral Daily  . busPIRone  10 mg Oral BID  . dicyclomine  20 mg Oral TID AC & HS  . enoxaparin (LOVENOX) injection  40 mg Subcutaneous Q24H  . hydrOXYzine  50 mg Oral q morning - 10a  . insulin aspart  0-20 Units Subcutaneous TID WC  . insulin glargine  40 Units Subcutaneous QHS  . lactulose  20 g Oral TID  . lamoTRIgine  25 mg Oral BID  .  lidocaine  1 patch Transdermal Q24H  . lubiprostone  24 mcg Oral BID WC  . metoCLOPramide  5 mg Oral TID  . mirtazapine  7.5 mg Oral QHS  . pantoprazole  40 mg Oral BID  . polyethylene glycol  17 g Oral Daily  . propranolol  20 mg Oral BID  . QUEtiapine  25 mg Oral q morning - 10a  . QUEtiapine  300 mg Oral QHS  . scopolamine  1 patch Transdermal Q72H  . senna-docusate  1 tablet Oral BID  . sodium chloride flush  3 mL Intravenous Q12H  . topiramate  25 mg Oral BID   Continuous Infusions: . sodium  chloride 100 mL/hr at 01/15/20 0402    Active Problems:   Hypertension   Generalized abdominal pain   Hyperglycemia due to diabetes mellitus (HCC)   Intractable nausea and vomiting   Polysubstance abuse (HCC)   SIRS (systemic inflammatory response syndrome) (HCC)   LOS: 0 days   A & P  Intractable nausea and vomiting  General abdominal pain: Acute on chronic.  Patient presents with generalized abdominal pain with nausea and vomiting.  She does not appear to be in DKA.  Records note previous issues with large amount of stool burden with constipation which patient still complains of. On the differential previously includes gastroparesis and cannabinoid hyperemesis syndrome. The patient will be admitted to a medical telemetry bed. She will have a scopolamine patch, IV reglan for nausea and vomiting, bentyl for cramping, and a lidocaine patch for pain. Avoid opiates due to history of gastroparesis.  Constipation: The patient states that she had a BM yesterday. However, she has a significant stool burden visible on the CT of her abdomen. She will receive docusate sodium, miralax, and lactulose to resolve the constipation.  DKA/Diabetes mellitus with hyperglycemia: Acute. Beta Hydroxybutyric acid was weakly positive on admission. This was effectively treated with lantus and SSI.  Home insulin regimen includes Lantus 70 units nightly and NovoLog 15 units with meals. The patient will receive lantus 45 units daily and SSI while here as she is still NPO.   SIRS: Patient was initially noted to be tachycardic and tachypneic with initially elevated at 10.6.  Suspect this is reactive in nature. UA negative.  Essential hypertension: Patient noted to have blood pressure elevated up to 160/95 with heart rates up into the 130s.  Home medications include propranolol 20 mg twice daily. Restart propranolol.  History of seizure disorder: Patient did not report any complaints of seizure. Seizure precautions.  Continue regimen of phenytoin Topamax.  Anxiety/depression/history of pseudoseizures: Continue Seroquel, hydroxyzine, and buspirone.  GERD: Continue Carafate and Protonix  History of polysubstance use: Patient reports intermittently smoking tobacco and previously had been edibles in the past but denies any recent use. Continue to counsel on the need of cessation of tobacco and illicit drug. UDS positive for THC. ? Role of cannabis hyperemesis.  I have seen and examined this patient myself. I have spent 35 minutes in her evaluation and care.  DVT prophylaxis: Lovenox Code Status: Full Family Communication: No family requested to be updated Disposition Plan: No family   Irelyn Perfecto, DO [facility] Direct contact: see [email or web address]  7PM-7AM contact night coverage as above 01/15/2020, 4:41 PM  LOS: 0 days

## 2020-01-15 NOTE — Plan of Care (Signed)

## 2020-01-15 NOTE — Progress Notes (Addendum)
NEW ADMISSION NOTE  Arrival Method: bed Mental Orientation: Alert and oriented x4  Telemetry: yes Assessment: Completed Skin: see notes Iv: right upper arm Pain: 9.5 Tubes: 01Safety Measures: Safety Fall Prevention Plan has been given, discussed and signed Admission: Completed [facility] Orientation: Patient has been orientated to the room, unit and staff.  Family: 0  Orders have been reviewed and implemented. Will continue to monitor the patient. Call light has been placed within reach and bed alarm has been activated.  Patient is unable to answer/provide complete admitting documentation  Patient reported that they have received their COVID vaccination back in 04/2020  Pat Patrick, RN

## 2020-01-15 NOTE — Progress Notes (Signed)
Patient non compliant with using call bell and bed alarm, RN educated the patient and will continue to [facility] r[facility] patient.

## 2020-01-16 DIAGNOSIS — E1165 Type 2 diabetes mellitus with hyperglycemia: Secondary | ICD-10-CM | POA: Diagnosis not present

## 2020-01-16 DIAGNOSIS — Z79899 Other long term (current) drug therapy: Secondary | ICD-10-CM | POA: Diagnosis not present

## 2020-01-16 DIAGNOSIS — E669 Obesity, unspecified: Secondary | ICD-10-CM | POA: Diagnosis present

## 2020-01-16 DIAGNOSIS — Z794 Long term (current) use of insulin: Secondary | ICD-10-CM | POA: Diagnosis not present

## 2020-01-16 DIAGNOSIS — Z8049 Family history of malignant neoplasm of other genital organs: Secondary | ICD-10-CM | POA: Diagnosis not present

## 2020-01-16 DIAGNOSIS — Z83438 Family history of other disorder of lipoprotein metabolism and other lipidemia: Secondary | ICD-10-CM | POA: Diagnosis not present

## 2020-01-16 DIAGNOSIS — R1084 Generalized abdominal pain: Secondary | ICD-10-CM | POA: Diagnosis not present

## 2020-01-16 DIAGNOSIS — K3184 Gastroparesis: Secondary | ICD-10-CM | POA: Diagnosis present

## 2020-01-16 DIAGNOSIS — Z800 Family history of malignant neoplasm of digestive organs: Secondary | ICD-10-CM | POA: Diagnosis not present

## 2020-01-16 DIAGNOSIS — L83 Acanthosis nigricans: Secondary | ICD-10-CM | POA: Diagnosis present

## 2020-01-16 DIAGNOSIS — R6510 Systemic inflammatory response syndrome (SIRS) of non-infectious origin without acute organ dysfunction: Secondary | ICD-10-CM | POA: Diagnosis present

## 2020-01-16 DIAGNOSIS — Z886 Allergy status to analgesic agent status: Secondary | ICD-10-CM | POA: Diagnosis not present

## 2020-01-16 DIAGNOSIS — Z8249 Family history of ischemic heart disease and other diseases of the circulatory system: Secondary | ICD-10-CM | POA: Diagnosis not present

## 2020-01-16 DIAGNOSIS — E1010 Type 1 diabetes mellitus with ketoacidosis without coma: Secondary | ICD-10-CM | POA: Diagnosis present

## 2020-01-16 DIAGNOSIS — K5909 Other constipation: Secondary | ICD-10-CM | POA: Diagnosis present

## 2020-01-16 DIAGNOSIS — Z6835 Body mass index (BMI) 35.0-35.9, adult: Secondary | ICD-10-CM | POA: Diagnosis not present

## 2020-01-16 DIAGNOSIS — G8929 Other chronic pain: Secondary | ICD-10-CM | POA: Diagnosis present

## 2020-01-16 DIAGNOSIS — R112 Nausea with vomiting, unspecified: Secondary | ICD-10-CM | POA: Diagnosis not present

## 2020-01-16 DIAGNOSIS — E1043 Type 1 diabetes mellitus with diabetic autonomic (poly)neuropathy: Secondary | ICD-10-CM | POA: Diagnosis not present

## 2020-01-16 DIAGNOSIS — I10 Essential (primary) hypertension: Secondary | ICD-10-CM | POA: Diagnosis present

## 2020-01-16 DIAGNOSIS — F419 Anxiety disorder, unspecified: Secondary | ICD-10-CM | POA: Diagnosis present

## 2020-01-16 DIAGNOSIS — Z833 Family history of diabetes mellitus: Secondary | ICD-10-CM | POA: Diagnosis not present

## 2020-01-16 DIAGNOSIS — F32A Depression, unspecified: Secondary | ICD-10-CM | POA: Diagnosis present

## 2020-01-16 DIAGNOSIS — M545 Low back pain, unspecified: Secondary | ICD-10-CM | POA: Diagnosis present

## 2020-01-16 DIAGNOSIS — Z20822 Contact with and (suspected) exposure to covid-19: Secondary | ICD-10-CM | POA: Diagnosis present

## 2020-01-16 DIAGNOSIS — G40909 Epilepsy, unspecified, not intractable, without status epilepticus: Secondary | ICD-10-CM | POA: Diagnosis present

## 2020-01-16 DIAGNOSIS — K219 Gastro-esophageal reflux disease without esophagitis: Secondary | ICD-10-CM | POA: Diagnosis present

## 2020-01-16 LAB — CBC WITH DIFFERENTIAL/PLATELET
Abs Immature Granulocytes: 0.02 10*3/uL (ref 0.00–0.07)
Basophils Absolute: 0 10*3/uL (ref 0.0–0.1)
Basophils Relative: 1 %
Eosinophils Absolute: 0 10*3/uL (ref 0.0–0.5)
Eosinophils Relative: 0 %
HCT: 35.9 % — ABNORMAL LOW (ref 36.0–46.0)
Hemoglobin: 11.2 g/dL — ABNORMAL LOW (ref 12.0–15.0)
Immature Granulocytes: 0 %
Lymphocytes Relative: 39 %
Lymphs Abs: 3.4 10*3/uL (ref 0.7–4.0)
MCH: 24.5 pg — ABNORMAL LOW (ref 26.0–34.0)
MCHC: 31.2 g/dL (ref 30.0–36.0)
MCV: 78.4 fL — ABNORMAL LOW (ref 80.0–100.0)
Monocytes Absolute: 0.8 10*3/uL (ref 0.1–1.0)
Monocytes Relative: 9 %
Neutro Abs: 4.4 10*3/uL (ref 1.7–7.7)
Neutrophils Relative %: 51 %
Platelets: 308 10*3/uL (ref 150–400)
RBC: 4.58 MIL/uL (ref 3.87–5.11)
RDW: 15.2 % (ref 11.5–15.5)
WBC: 8.8 10*3/uL (ref 4.0–10.5)
nRBC: 0 % (ref 0.0–0.2)

## 2020-01-16 LAB — GLUCOSE, CAPILLARY
Glucose-Capillary: 236 mg/dL — ABNORMAL HIGH (ref 70–99)
Glucose-Capillary: 86 mg/dL (ref 70–99)
Glucose-Capillary: 234 mg/dL — ABNORMAL HIGH (ref 70–99)

## 2020-01-16 LAB — BASIC METABOLIC PANEL
Anion gap: 8 (ref 5–15)
BUN: 5 mg/dL — ABNORMAL LOW (ref 6–20)
CO2: 22 mmol/L (ref 22–32)
Calcium: 8.4 mg/dL — ABNORMAL LOW (ref 8.9–10.3)
Chloride: 108 mmol/L (ref 98–111)
Creatinine, Ser: 0.49 mg/dL (ref 0.44–1.00)
GFR, Estimated: >60 mL/min (ref >60)
Glucose, Bld: 156 mg/dL — ABNORMAL HIGH (ref 70–99)
Potassium: 3.1 mmol/L — ABNORMAL LOW (ref 3.5–5.1)
Sodium: 138 mmol/L (ref 135–145)

## 2020-01-16 LAB — MAGNESIUM: Magnesium: 2 mg/dL (ref 1.7–2.4)

## 2020-01-16 LAB — LAMOTRIGINE LEVEL: Lamotrigine Lvl: <1 ug/mL — ABNORMAL LOW (ref 2.0–20.0)

## 2020-01-16 MED ORDER — SENNOSIDES-DOCUSATE SODIUM 8.6-50 MG PO TABS: 1.0000 | ORAL_TABLET | Freq: Two times a day (BID) | ORAL | 0 refills | Status: DC

## 2020-01-16 MED ORDER — POTASSIUM CHLORIDE 10 MEQ/100ML IV SOLN
10.0000 meq | INTRAVENOUS | Status: AC
  Administered 2020-01-16 (×4): 10 meq via INTRAVENOUS
  Filled 2020-01-16 (×4): qty 100

## 2020-01-16 MED ORDER — POLYETHYLENE GLYCOL 3350 17 G PO PACK: 17.0000 g | PACK | Freq: Two times a day (BID) | ORAL | 0 refills | Status: DC

## 2020-01-16 NOTE — Plan of Care (Signed)
  Problem: Education: Goal: Knowledge of General Education information will improve Description: Including pain rating scale, medication(s)/side effects and non-pharmacologic comfort measures Outcome: Adequate for Discharge   Problem: Health Behavior/Discharge Planning: Goal: Ability to manage health-related needs will improve 01/16/2020 1720 by Katrina Stack, RN Outcome: Adequate for Discharge 01/16/2020 0824 by Katrina Stack, RN Outcome: Progressing   Problem: Clinical Measurements: Goal: Ability to maintain clinical measurements within normal limits will improve Outcome: Adequate for Discharge Goal: Will remain free from infection Outcome: Adequate for Discharge Goal: Diagnostic test results will improve 01/16/2020 1720 by Katrina Stack, RN Outcome: Adequate for Discharge 01/16/2020 2836 by Katrina Stack, RN Outcome: Progressing Goal: Respiratory complications will improve Outcome: Adequate for Discharge Goal: Cardiovascular complication will be avoided Outcome: Adequate for Discharge   Problem: Activity: Goal: Risk for activity intolerance will decrease Outcome: Adequate for Discharge   Problem: Nutrition: Goal: Adequate nutrition will be maintained Outcome: Adequate for Discharge   Problem: Coping: Goal: Level of anxiety will decrease Outcome: Adequate for Discharge   Problem: Elimination: Goal: Will not experience complications related to bowel motility Outcome: Adequate for Discharge Goal: Will not experience complications related to urinary retention Outcome: Adequate for Discharge   Problem: Pain Managment: Goal: General experience of comfort will improve Outcome: Adequate for Discharge   Problem: Safety: Goal: Ability to remain free from injury will improve Outcome: Adequate for Discharge   Problem: Skin Integrity: Goal: Risk for impaired skin integrity will decrease Outcome: Adequate for Discharge

## 2020-01-16 NOTE — Plan of Care (Signed)
  Problem: Health Behavior/Discharge Planning: Goal: Ability to manage health-related needs will improve Outcome: Progressing   Problem: Clinical Measurements: Goal: Diagnostic test results will improve Outcome: Progressing   

## 2020-01-16 NOTE — Discharge Summary (Signed)
Physician Discharge Summary  Nancy [name] EVO:[id number] DOB: 2000/04/01 DOA: 01/13/2020  PCP: Fleet Contras, MD  Admit date: 01/13/2020 Discharge date: 01/16/2020  Recommendations for Outpatient Follow-up:  1. Discharge to home 2. Follow up with PCP in 7-10 days. 3. Follow up with gastroenterology in 2-4 weeks.  Discharge Diagnoses: Principal diagnosis is #1 1. Intractable nausea and vomiting 2. Diabetic gastroparesis 3. Abdominal pain 4. DKA 5. DM II - uncontrolled  Discharge Condition: Fair  Disposition: Home  Diet recommendation: Modified carbohydrate  Filed Weights   01/15/20 0406 01/15/20 1400  Weight: 89.4 kg 89.9 kg    History of present illness: Nancy [name] is a 20 y.o. 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  She has been unable to keep any food or medications down or able to sleep due to her symptoms. Complains of chronically having constipation, but had a small bowel movement that was formed yesterday.  She states that she currently smokes cigarettes.  Her previous drug screen came back positive for marijuana because she had been doing edibles, but had not done any since that time.  ED Course: Upon admission to the emergency department patient was seen to be afebrile with tachycardic and tachypneic.  Labs significant for WBC 10.6, CO2 22, glucose 317, beta hydroxybutyrate was 1.9, and anion gap 14.  Venous pH was 7.433.  Influenza and COVID-19 screening were negative.  She is given 2 L of IV fluids, antiemetics, pain medication, and had been started on insulin drip per protocol.  TRH called to admit.  Hospital Course:  The patient was kept NPO  initially, although it has been documented that she was eating in the ED anyway. Her glucoses and DKA were managed by continuing a lower dose of lantus than she usually takes at home (30 units vs 70). Her very mild case of DKA resolved by the next am. After she was moved upstairs she was NPO overnight the next day. The following am the patient was complaining that she was hungry and that she was having pain from being hungry. She was advanced to a clear liquid diet which she tolerated without difficulty. This morning her diet was advanced to a soft diet, which she has tolerated well.   She will be discharged to home in fair condition.  Today's assessment: S: The patient is resting comfortably. No new complaints. O: Vitals:  Vitals:   01/16/20 0500 01/16/20 0940  BP: 128/70 130/83  Pulse: 92 (!) 105  Resp: 18 20  Temp: 98.8 F (37.1 C) 98 F (36.7 C)  SpO2: 100% 100%   Exam:  Constitutional:   The patient is awake, alert, and oriented x 3. No acute distress. Respiratory:   No increased work of breathing.  No wheezes, rales, or rhonchi  No tactile fremitus Cardiovascular:   Regular rate and rhythm  No murmurs, ectopy, or gallups.  No lateral PMI. No thrills. Abdomen:   Abdomen is soft, non-tender, non-distended  No hernias, masses, or organomegaly  Normoactive bowel sounds.  Musculoskeletal:   No cyanosis, clubbing, or edema Skin:   No rashes, lesions, ulcers  palpation of skin: no induration or nodules Neurologic:   CN 2-12 intact  Sensation all 4 extremities intact Psychiatric:   Mental status ? Mood, affect  appropriate ? Orientation to person, place, time   judgment and insight appear intact  Discharge Instructions  Discharge Instructions    Activity as tolerated - No restrictions   Complete by: As directed    Call MD for:  persistant nausea and vomiting   Complete by: As directed    Call MD for:  severe uncontrolled pain   Complete by: As  directed    Diet - low sodium heart healthy   Complete by: As directed    Discharge instructions   Complete by: As directed    Discharge to home Follow up with PCP in 7-10 days. Follow up with gastroenterology in 2-4 weeks.   Increase activity slowly   Complete by: As directed      Allergies as of 01/16/2020      Reactions   Tomato Anaphylaxis   Ibuprofen Other (See Comments)   GI MD said to not take this anymore   Oatmeal Rash      Medication List    TAKE these medications   ARIPiprazole 10 MG tablet Commonly known as: ABILIFY Take 10 mg by mouth in the morning and at bedtime.   atorvastatin 20 MG tablet Commonly known as: LIPITOR Take 1 tablet (20 mg total) by mouth daily at 6 PM. What changed: when to take this   busPIRone 10 MG tablet Commonly known as: BUSPAR Take 10 mg by mouth 2 (two) times daily.   diclofenac Sodium 1 % Gel Commonly known as: VOLTAREN Apply 1 application topically 2 (two) times daily as needed (chest pains).   dicyclomine 10 MG capsule Commonly known as: BENTYL Take 1 capsule (10 mg total) by mouth 4 (four) times daily as needed for spasms.   hydrOXYzine 50 MG tablet Commonly known as: ATARAX/VISTARIL Take 50 mg by mouth every morning.   insulin glargine 100 unit/mL Sopn Commonly known as: LANTUS Inject 70 Units into the skin at bedtime.   lamoTRIgine 25 MG tablet Commonly known as: LAMICTAL Take 2 tablets (50 mg total) by mouth 2 (two) times daily. For mood stabilization What changed: how much to take   lidocaine 5 % Commonly known as: LIDODERM Place 1 patch onto the skin daily as needed (pain). Remove & Discard patch within 12 hours or as directed by MD   lubiprostone 24 MCG capsule Commonly known as: AMITIZA Take 1 capsule (24 mcg total) by mouth 2 (two) times daily with a meal.   metoCLOPramide 5 MG tablet Commonly known as: Reglan Take 1 tablet (5 mg total) by mouth 3 (three) times daily.   mirtazapine 7.5 MG  tablet Commonly known as: REMERON Take 1 tablet (7.5 mg total) by mouth at bedtime. For depression/sleep   multivitamin with minerals Tabs tablet Take 1 tablet by mouth daily.   NovoLOG FlexPen 100 UNIT/ML FlexPen Generic drug: insulin aspart Inject 15 Units into the skin See admin instructions. Inject 15 units subcutaneously up to three times daily after meals   pantoprazole 40 MG tablet Commonly known as: PROTONIX Take 1 tablet (40 mg total) by mouth 2 (two) times daily. For acid reflux   Pazeo 0.7 % Soln Generic drug: Olopatadine HCl Place 2 drops into both eyes 2 (two) times daily as needed (allergies).   polyethylene glycol 17 g packet Commonly known as: MIRALAX / GLYCOLAX Take 17 g by mouth 2 (two) times daily. What changed: when to take this   prochlorperazine 25 MG suppository Commonly known as: COMPAZINE Place 1 suppository (25 mg total) rectally  every 12 (twelve) hours as needed for nausea or vomiting.   propranolol 20 MG tablet Commonly known as: INDERAL Take 1 tablet (20 mg total) by mouth 2 (two) times daily. For anxiety/HTN   QUEtiapine 25 MG tablet Commonly known as: SEROQUEL Take 25 mg by mouth every morning.   QUEtiapine 100 MG tablet Commonly known as: SEROQUEL Take 300 mg by mouth at bedtime.   senna-docusate 8.6-50 MG tablet Commonly known as: Senokot-S Take 1 tablet by mouth 2 (two) times daily.   sucralfate 1 GM/10ML suspension Commonly known as: CARAFATE Take 1 g by mouth at bedtime as needed (stomach pain).   topiramate 25 MG tablet Commonly known as: TOPAMAX Take 1 tablet (25 mg total) by mouth 2 (two) times daily. For seizure activities   VITAMIN B-12 PO Take 1 tablet by mouth daily.      Allergies  Allergen Reactions  . Tomato Anaphylaxis  . Ibuprofen Other (See Comments)    GI MD said to not take this anymore  . Oatmeal Rash    The results of significant diagnostics from this hospitalization (including imaging, microbiology,  ancillary and laboratory) are listed below for reference.    Significant Diagnostic Studies: DG Chest Portable 1 View  Result Date: 01/01/2020 CLINICAL DATA:  Chest pain EXAM: PORTABLE CHEST 1 VIEW COMPARISON:  November 24, 2019 FINDINGS: The heart size and mediastinal contours are within normal limits. Both lungs are clear. The visualized skeletal structures are unremarkable. IMPRESSION: No active disease. Electronically Signed   By: Katherine Mantle M.D.   On: 01/01/2020 18:04   DG ABD ACUTE 2+V W 1V CHEST  Result Date: 01/14/2020 CLINICAL DATA:  Abdominal pain and chest pain EXAM: DG ABDOMEN ACUTE WITH 1 VIEW CHEST COMPARISON:  November 24, 2019 FINDINGS: The cardiomediastinal silhouette is normal in contour. No pleural effusion. No pneumothorax. No acute pleuroparenchymal abnormality. Air and stool filled nondilated loops of bowel. Air is visualized in the rectum. Moderate colonic stool burden predominately within the RIGHT hemicolon. No free air. No acute osseous abnormality noted. IMPRESSION: 1. No radiographic evidence of bowel obstruction or free air. If persistent concern, recommend dedicated CT. 2. Moderate colonic stool burden predominately within the RIGHT hemicolon. 3. No acute cardiopulmonary abnormality. Electronically Signed   By: Meda Klinefelter MD   On: 01/14/2020 19:36    Microbiology: Recent Results (from the past 240 hour(s))  Resp Panel by RT-PCR (Flu A&B, Covid) Nasopharyngeal Swab     Status: None   Collection Time: 01/14/20 12:42 PM   Specimen: Nasopharyngeal Swab; Nasopharyngeal(NP) swabs in vial transport medium  Result Value Ref Range Status   SARS Coronavirus 2 by RT PCR NEGATIVE NEGATIVE Final    Comment: (NOTE) SARS-CoV-2 target nucleic acids are NOT DETECTED.  The SARS-CoV-2 RNA is generally detectable in upper respiratory specimens during the acute phase of infection. The lowest concentration of SARS-CoV-2 viral copies this assay can detect is 138  copies/mL. A negative result does not preclude SARS-Cov-2 infection and should not be used as the sole basis for treatment or other patient management decisions. A negative result may occur with  improper specimen collection/handling, submission of specimen other than nasopharyngeal swab, presence of viral mutation(s) within the areas targeted by this assay, and inadequate number of viral copies(<138 copies/mL). A negative result must be combined with clinical observations, patient history, and epidemiological information. The expected result is Negative.  Fact Sheet for Patients:  BloggerCourse.com  Fact Sheet for Healthcare Providers:  SeriousBroker.it  This test  is no t yet approved or cleared by the [place] and  has been authorized for detection and/or diagnosis of SARS-CoV-2 by FDA under an Emergency Use Authorization (EUA). This EUA will remain  in effect (meaning this test can be used) for the duration of the COVID-19 declaration under Section 564(b)(1) of the Act, 21 U.S.C.section 360bbb-3(b)(1), unless the authorization is terminated  or revoked sooner.       Influenza A by PCR NEGATIVE NEGATIVE Final   Influenza B by PCR NEGATIVE NEGATIVE Final    Comment: (NOTE) The Xpert Xpress SARS-CoV-2/FLU/RSV plus assay is intended as an aid in the diagnosis of influenza from Nasopharyngeal swab specimens and should not be used as a sole basis for treatment. Nasal washings and aspirates are unacceptable for Xpert Xpress SARS-CoV-2/FLU/RSV testing.  Fact Sheet for Patients: BloggerCourse.com  Fact Sheet for Healthcare Providers: SeriousBroker.it  This test is not yet approved or cleared by the [place] FDA and has been authorized for detection and/or diagnosis of SARS-CoV-2 by FDA under an Emergency Use Authorization (EUA). This EUA will remain in effect (meaning  this test can be used) for the duration of the COVID-19 declaration under Section 564(b)(1) of the Act, 21 U.S.C. section 360bbb-3(b)(1), unless the authorization is terminated or revoked.  Performed at [facility], [address] [place], [place] [address]      Labs: Basic Metabolic Panel: Recent Labs  Lab 01/13/20 2314 01/14/20 1230 01/14/20 1244 01/15/20 0417 01/16/20 0310  NA 135 133* 135 135 138  K 4.0 3.9 3.9 3.7 3.1*  CL 99 97*  --  104 108  CO2 20* 22  --  21* 22  GLUCOSE 332* 317*  --  257* 156*  BUN 7 8  --  8 5*  CREATININE 0.62 0.67  --  0.61 0.49  CALCIUM 9.8 9.3  --  8.6* 8.4*  MG  --   --   --   --  2.0   Liver Function Tests: Recent Labs  Lab 01/13/20 2314  AST 17  ALT 15  ALKPHOS 100  BILITOT 0.9  PROT 8.2*  ALBUMIN 4.4   Recent Labs  Lab 01/13/20 2314  LIPASE 20   No results for input(s): AMMONIA in the last 168 hours. CBC: Recent Labs  Lab 01/13/20 2314 01/14/20 1244 01/15/20 0417 01/16/20 0310  WBC 10.6*  --  10.6* 8.8  NEUTROABS  --   --   --  4.4  HGB 13.8 15.0 11.7* 11.2*  HCT 43.5 44.0 37.3 35.9*  MCV 77.1*  --  79.4* 78.4*  PLT 362  --  273 308   Cardiac Enzymes: No results for input(s): CKTOTAL, CKMB, CKMBINDEX, TROPONINI in the last 168 hours. BNP: BNP (last 3 results) No results for input(s): BNP in the last 8760 hours.  ProBNP (last 3 results) No results for input(s): PROBNP in the last 8760 hours.  CBG: Recent Labs  Lab 01/15/20 1426 01/15/20 1726 01/15/20 2043 01/16/20 0907 01/16/20 1147  GLUCAP 167* 156* 118* 236* 86    Active Problems:   Hypertension   Nausea and vomiting   Generalized abdominal pain   Hyperglycemia due to diabetes mellitus (HCC)   Intractable nausea and vomiting   Polysubstance abuse (HCC)   SIRS (systemic inflammatory response syndrome) (HCC)   Time coordinating discharge: 38 minutes  Signed:        Janelly Switalski, DO [facility]  01/16/2020, 4:27 PM

## 2020-01-16 NOTE — Progress Notes (Signed)
DISCHARGE NOTE HOME Nancy [name] to be discharged Home per MD order. Discussed prescriptions and follow up appointments with the patient. Prescriptions given to patient; medication list explained in detail. Patient verbalized understanding.  Skin clean, dry and intact without evidence of skin break down, no evidence of skin tears noted. IV catheter discontinued intact. Site without signs and symptoms of complications. Dressing and pressure applied. Pt denies pain at the site currently. No complaints noted.  Patient free of lines, drains, and wounds.   An After Visit Summary (AVS) was printed and given to the patient. Patient ambulated and declined to be wheeled to lobby, and discharged home via private auto.  Katrina Stack, RN

## 2020-01-27 IMAGING — DX DG CHEST 1V PORT
1 series · 1 of 1 positions shown · non-contrast
Comparison: December 19, 2018

CLINICAL DATA: Cough.

EXAM:
PORTABLE CHEST 1 VIEW

[chest ap]
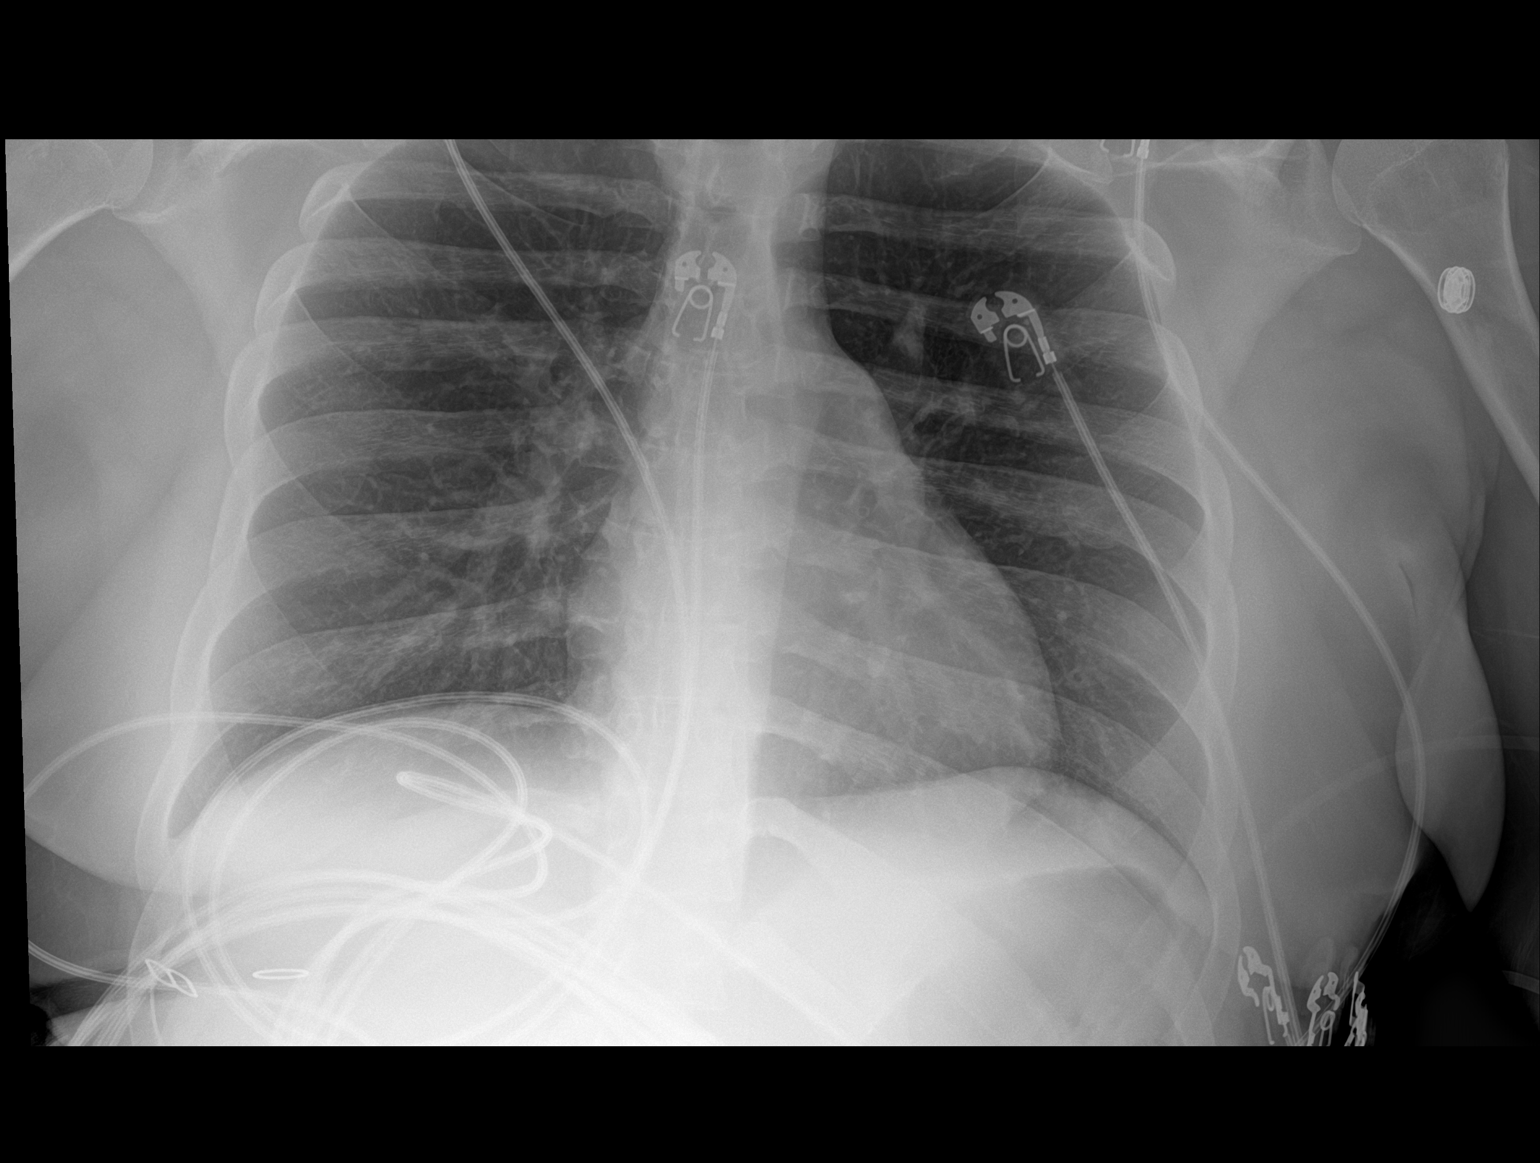

[1 of 1 positions shown; findings below may reference images not displayed]

FINDINGS: The heart size and mediastinal contours are within normal limits.
Both lungs are clear. The visualized skeletal structures are
unremarkable.
IMPRESSION: No active disease.

## 2020-01-29 IMAGING — US US ABDOMEN COMPLETE
1 series · 14 of 25 positions shown · non-contrast
Comparison: Ultrasound 09/06/[address] abdomen 12/19/2018

CLINICAL DATA: 18-year-old with abdominal pain.

EXAM:
ABDOMEN ULTRASOUND COMPLETE

[Series 1: us abdomen complete · 14 of 87 slices shown]
[im 1/87]
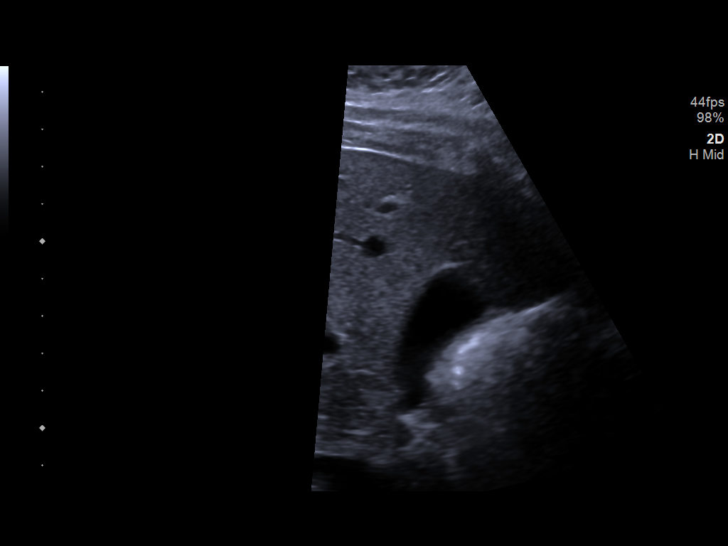
[im 8/87]
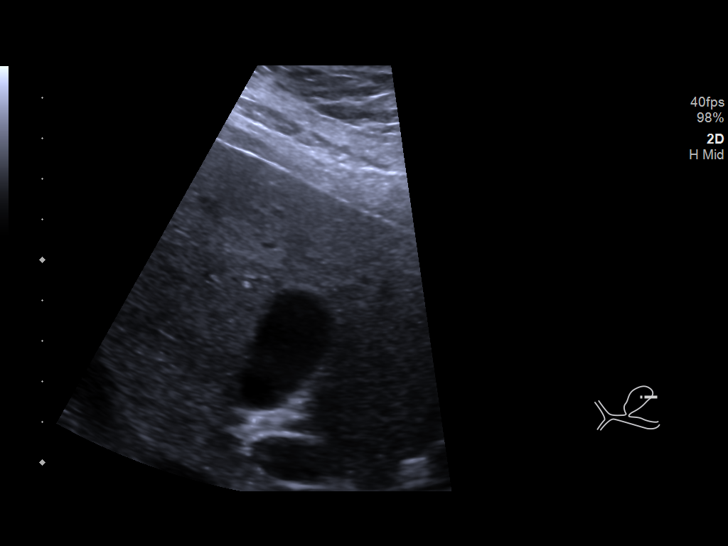
[im 15/87]
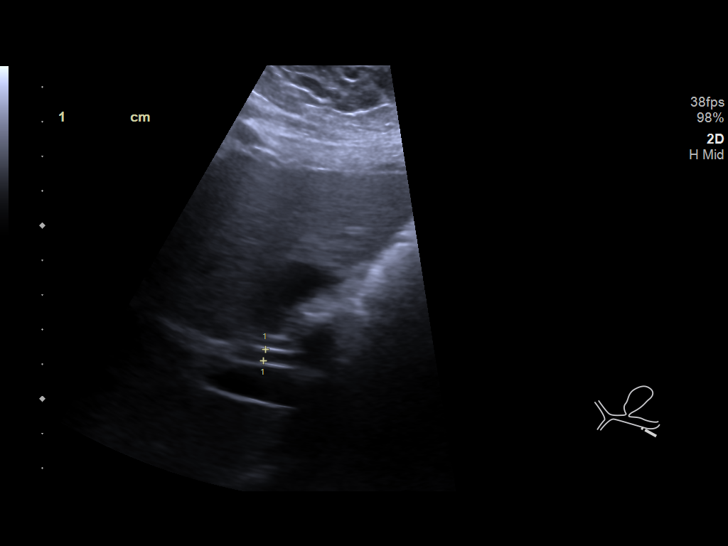
[im 22/87]
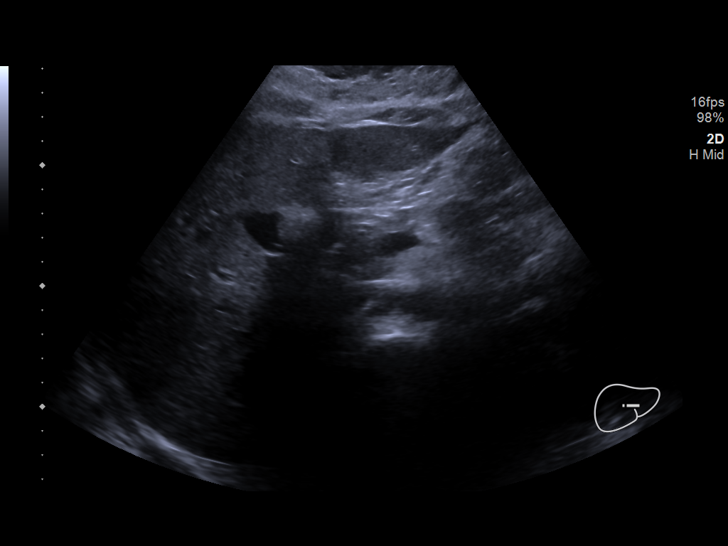
[im 29/87]
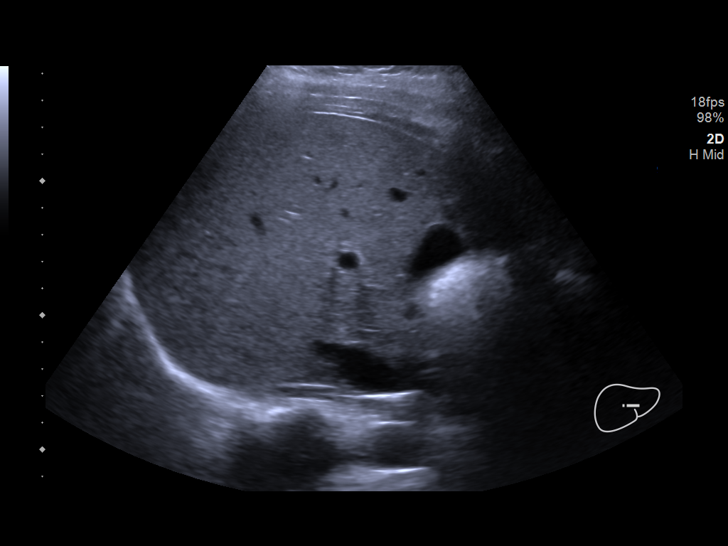
[im 33/87]
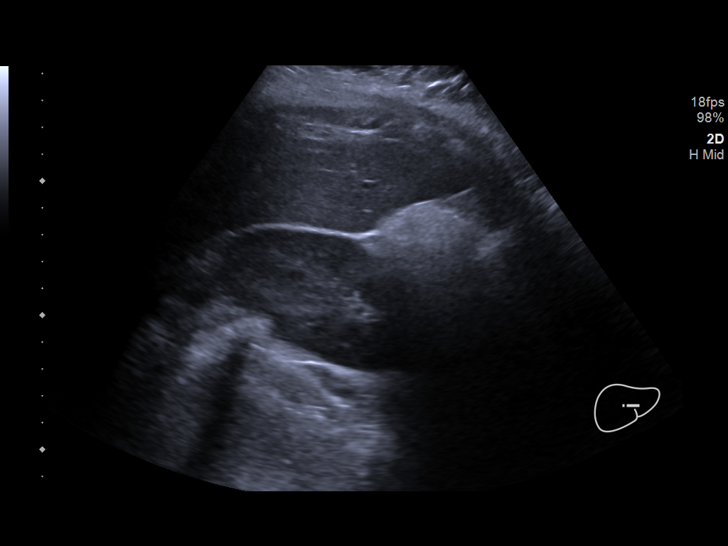
[im 40/87]
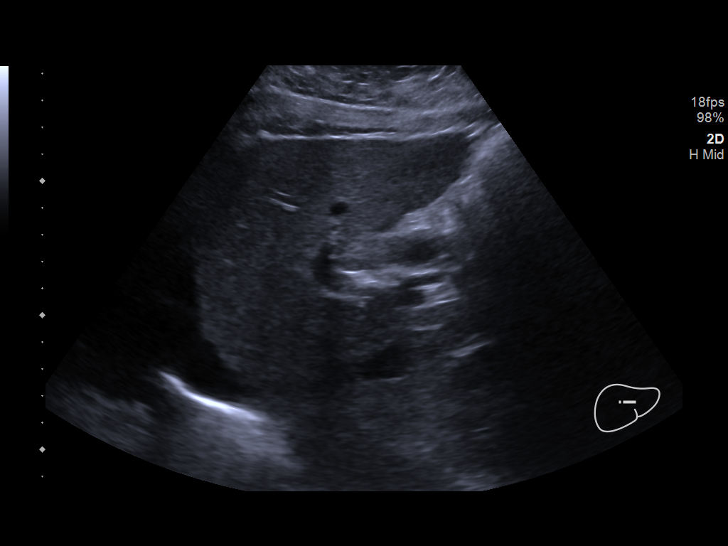
[im 47/87]
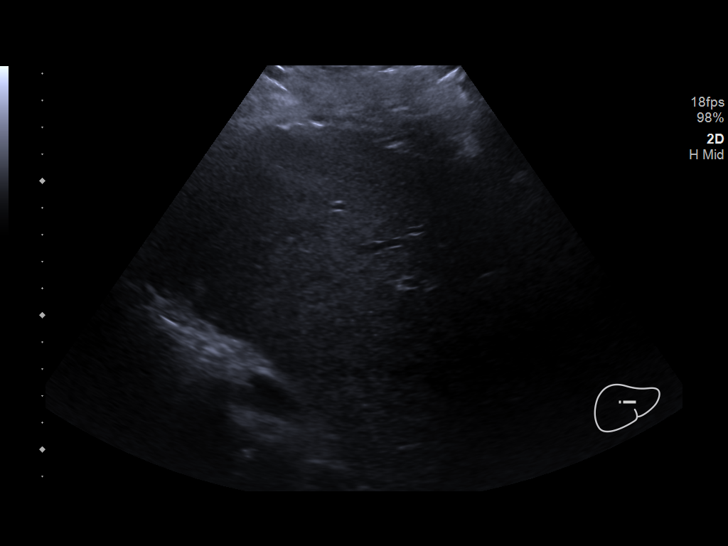
[im 54/87]
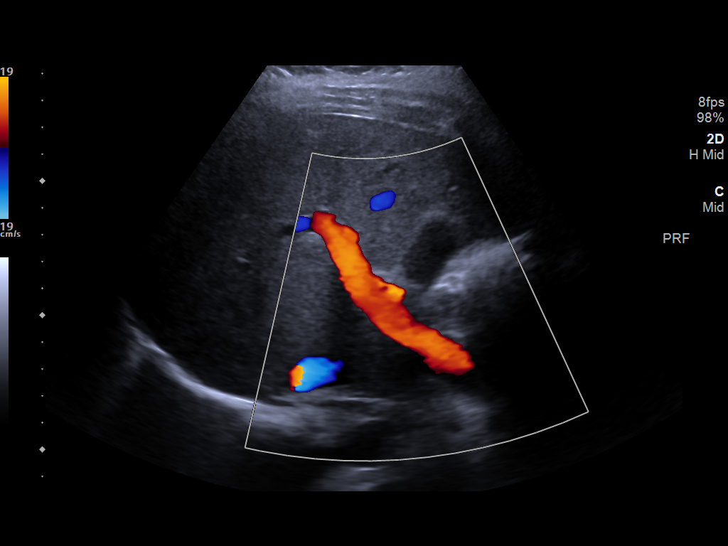
[im 58/87]
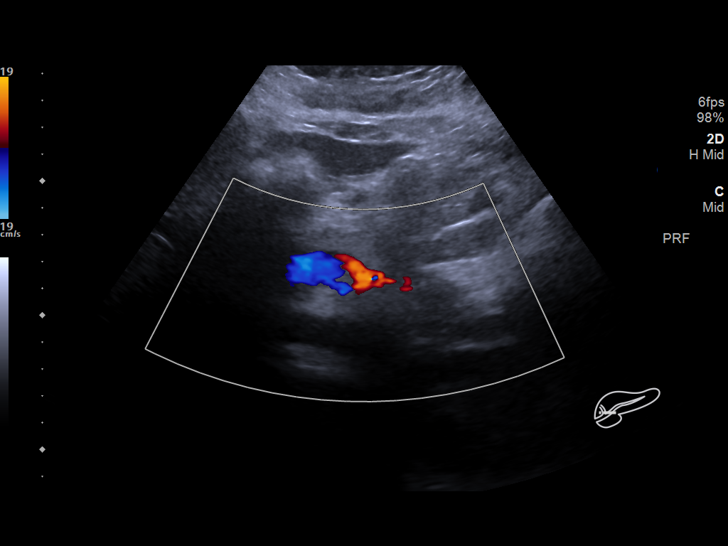
[im 65/87]
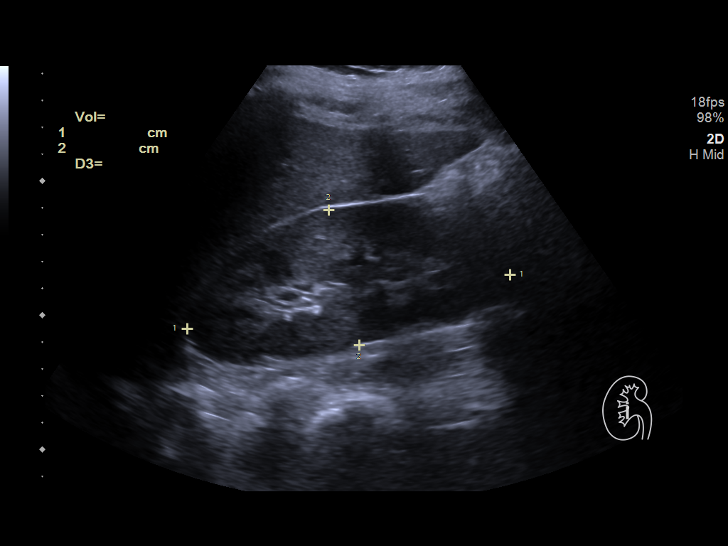
[im 72/87]
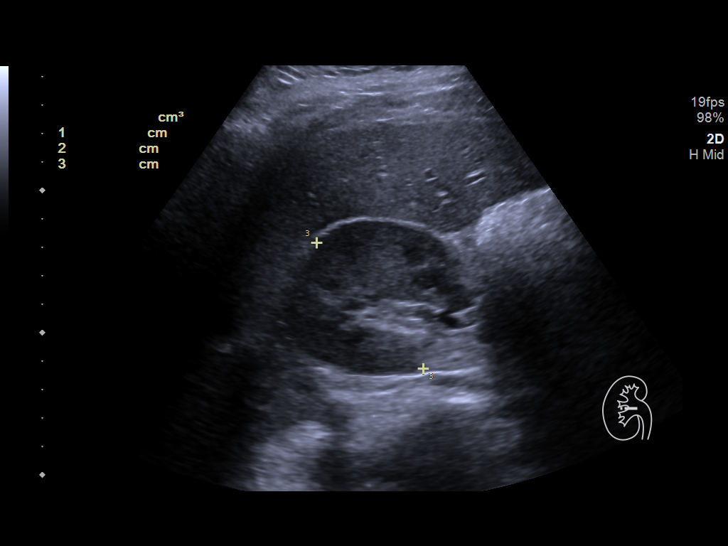
[im 79/87]
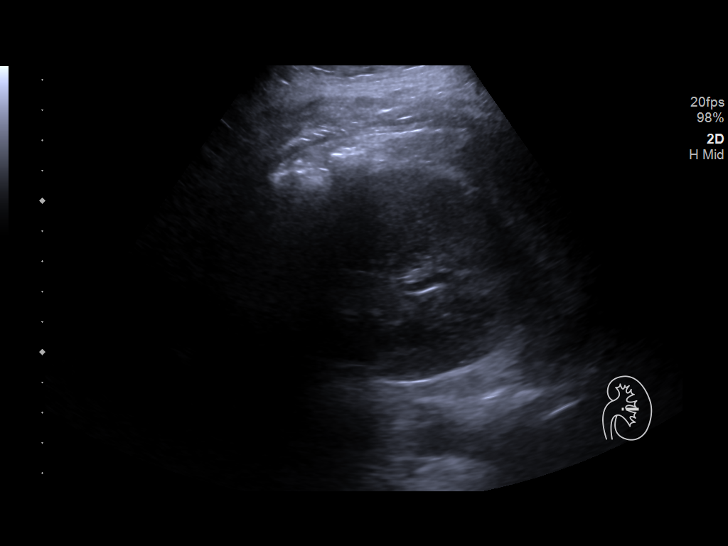
[im 87/87]
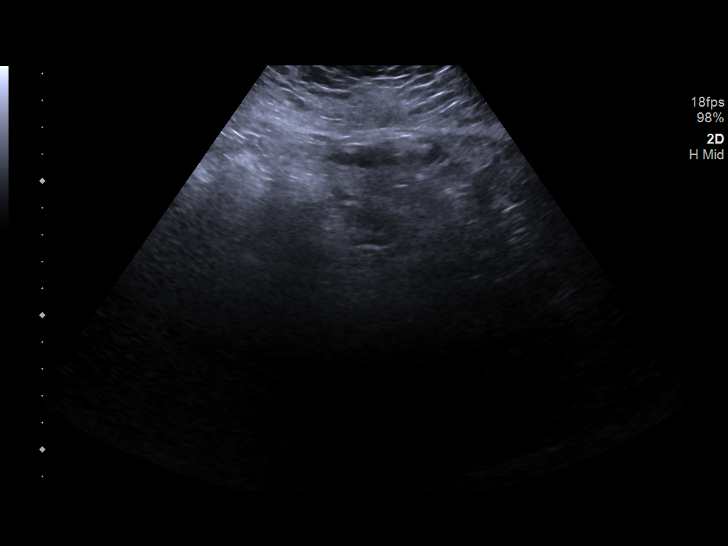

[14 of 25 positions shown; findings below may reference images not displayed]

FINDINGS: Gallbladder: No gallstones or wall thickening visualized. No
sonographic Murphy sign noted by sonographer.

Common bile duct: Diameter: 0.3 cm

Liver: No focal lesion identified. Within normal limits in
parenchymal echogenicity. Portal vein is patent on color Doppler
imaging with normal direction of blood flow towards the liver.

IVC: No abnormality visualized.

Pancreas: Visualized portion unremarkable.

Spleen: Size and appearance within normal limits.

Right Kidney: Length: 12.2 cm. Echogenicity within normal limits. No
mass or hydronephrosis visualized.

Left Kidney: Length: 11.2 cm. Echogenicity within normal limits. No
mass or hydronephrosis visualized.

Abdominal aorta: No aneurysm visualized. Distal abdominal aorta is
obscured by bowel gas.

Other findings: None.
IMPRESSION: Normal abdominal ultrasound.

## 2020-02-15 ENCOUNTER — Emergency Department (HOSPITAL_COMMUNITY)
Admission: EM | Admit: 2020-02-15 | Discharge: 2020-02-15 | Disposition: A | Discharge status: Home / Self Care | Payer: Medicaid Other | Attending: Emergency Medicine | Admitting: Emergency Medicine

## 2020-02-15 ENCOUNTER — Encounter (HOSPITAL_COMMUNITY): Payer: Self-pay | Admitting: *Deleted

## 2020-02-15 ENCOUNTER — Emergency Department (HOSPITAL_COMMUNITY): Payer: Medicaid Other

## 2020-02-15 ENCOUNTER — Other Ambulatory Visit: Payer: Self-pay

## 2020-02-15 DIAGNOSIS — R112 Nausea with vomiting, unspecified: Secondary | ICD-10-CM | POA: Diagnosis present

## 2020-02-15 DIAGNOSIS — E1310 Other specified diabetes mellitus with ketoacidosis without coma: Secondary | ICD-10-CM

## 2020-02-15 DIAGNOSIS — R079 Chest pain, unspecified: Secondary | ICD-10-CM | POA: Diagnosis not present

## 2020-02-15 DIAGNOSIS — R569 Unspecified convulsions: Secondary | ICD-10-CM | POA: Diagnosis not present

## 2020-02-15 DIAGNOSIS — E1110 Type 2 diabetes mellitus with ketoacidosis without coma: Secondary | ICD-10-CM

## 2020-02-15 DIAGNOSIS — Z79899 Other long term (current) drug therapy: Secondary | ICD-10-CM | POA: Insufficient documentation

## 2020-02-15 DIAGNOSIS — I509 Heart failure, unspecified: Secondary | ICD-10-CM | POA: Insufficient documentation

## 2020-02-15 DIAGNOSIS — I110 Hypertensive heart disease with heart failure: Secondary | ICD-10-CM | POA: Insufficient documentation

## 2020-02-15 DIAGNOSIS — Z794 Long term (current) use of insulin: Secondary | ICD-10-CM | POA: Insufficient documentation

## 2020-02-15 LAB — URINALYSIS, ROUTINE W REFLEX MICROSCOPIC
Bilirubin Urine: NEGATIVE
Glucose, UA: >=500 mg/dL — AB
Ketones, ur: 80 mg/dL — AB
Leukocytes,Ua: NEGATIVE
Nitrite: NEGATIVE
Protein, ur: NEGATIVE mg/dL
Specific Gravity, Urine: 1.035 — ABNORMAL HIGH (ref 1.005–1.030)
pH: 5 (ref 5.0–8.0)

## 2020-02-15 LAB — BASIC METABOLIC PANEL
Anion gap: 22 — ABNORMAL HIGH (ref 5–15)
Anion gap: 15 (ref 5–15)
BUN: 13 mg/dL (ref 6–20)
BUN: 13 mg/dL (ref 6–20)
CO2: 19 mmol/L — ABNORMAL LOW (ref 22–32)
CO2: 23 mmol/L (ref 22–32)
Calcium: 9.7 mg/dL (ref 8.9–10.3)
Calcium: 8.7 mg/dL — ABNORMAL LOW (ref 8.9–10.3)
Chloride: 92 mmol/L — ABNORMAL LOW (ref 98–111)
Chloride: 98 mmol/L (ref 98–111)
Creatinine, Ser: 0.78 mg/dL (ref 0.44–1.00)
Creatinine, Ser: 0.67 mg/dL (ref 0.44–1.00)
GFR, Estimated: >60 mL/min (ref >60)
GFR, Estimated: >60 mL/min (ref >60)
Glucose, Bld: 477 mg/dL — ABNORMAL HIGH (ref 70–99)
Glucose, Bld: 309 mg/dL — ABNORMAL HIGH (ref 70–99)
Potassium: 5 mmol/L (ref 3.5–5.1)
Potassium: 5.3 mmol/L — ABNORMAL HIGH (ref 3.5–5.1)
Sodium: 133 mmol/L — ABNORMAL LOW (ref 135–145)
Sodium: 136 mmol/L (ref 135–145)

## 2020-02-15 LAB — I-STAT VENOUS BLOOD GAS, ED
Acid-Base Excess: 7 mmol/L — ABNORMAL HIGH (ref 0.0–2.0)
Bicarbonate: 31.1 mmol/L — ABNORMAL HIGH (ref 20.0–28.0)
Calcium, Ion: 1.09 mmol/L — ABNORMAL LOW (ref 1.15–1.40)
HCT: 44 % (ref 36.0–46.0)
Hemoglobin: 15 g/dL (ref 12.0–15.0)
O2 Saturation: 99 %
Potassium: 4.9 mmol/L (ref 3.5–5.1)
Sodium: 133 mmol/L — ABNORMAL LOW (ref 135–145)
TCO2: 32 mmol/L (ref 22–32)
pCO2, Ven: 40.9 mmHg — ABNORMAL LOW (ref 44.0–60.0)
pH, Ven: 7.489 — ABNORMAL HIGH (ref 7.250–7.430)
pO2, Ven: 130 mmHg — ABNORMAL HIGH (ref 32.0–45.0)

## 2020-02-15 LAB — CBC WITH DIFFERENTIAL/PLATELET
Abs Immature Granulocytes: 0.06 10*3/uL (ref 0.00–0.07)
Basophils Absolute: 0 10*3/uL (ref 0.0–0.1)
Basophils Relative: 0 %
Eosinophils Absolute: 0 10*3/uL (ref 0.0–0.5)
Eosinophils Relative: 0 %
HCT: 43 % (ref 36.0–46.0)
Hemoglobin: 13.4 g/dL (ref 12.0–15.0)
Immature Granulocytes: 0 %
Lymphocytes Relative: 14 %
Lymphs Abs: 2.1 10*3/uL (ref 0.7–4.0)
MCH: 24.1 pg — ABNORMAL LOW (ref 26.0–34.0)
MCHC: 31.2 g/dL (ref 30.0–36.0)
MCV: 77.2 fL — ABNORMAL LOW (ref 80.0–100.0)
Monocytes Absolute: 1.2 10*3/uL — ABNORMAL HIGH (ref 0.1–1.0)
Monocytes Relative: 8 %
Neutro Abs: 11.1 10*3/uL — ABNORMAL HIGH (ref 1.7–7.7)
Neutrophils Relative %: 78 %
Platelets: 497 10*3/uL — ABNORMAL HIGH (ref 150–400)
RBC: 5.57 MIL/uL — ABNORMAL HIGH (ref 3.87–5.11)
RDW: 15.5 % (ref 11.5–15.5)
WBC: 14.6 10*3/uL — ABNORMAL HIGH (ref 4.0–10.5)
nRBC: 0 % (ref 0.0–0.2)

## 2020-02-15 LAB — RAPID URINE DRUG SCREEN, HOSP PERFORMED
Amphetamines: NOT DETECTED
Barbiturates: NOT DETECTED
Benzodiazepines: NOT DETECTED
Cocaine: NOT DETECTED
Opiates: POSITIVE — AB
Tetrahydrocannabinol: POSITIVE — AB

## 2020-02-15 LAB — CBG MONITORING, ED
Glucose-Capillary: 505 mg/dL (ref 70–99)
Glucose-Capillary: 294 mg/dL — ABNORMAL HIGH (ref 70–99)

## 2020-02-15 LAB — I-STAT BETA HCG BLOOD, ED (MC, WL, AP ONLY): I-stat hCG, quantitative: <5 m[IU]/mL (ref <5)

## 2020-02-15 LAB — BETA-HYDROXYBUTYRIC ACID: Beta-Hydroxybutyric Acid: 2.94 mmol/L — ABNORMAL HIGH (ref 0.05–0.27)

## 2020-02-15 MED ORDER — LACTATED RINGERS IV SOLN: INTRAVENOUS | Status: DC

## 2020-02-15 MED ORDER — INSULIN REGULAR(HUMAN) IN NACL 100-0.9 UT/100ML-% IV SOLN
INTRAVENOUS | Status: DC
  Filled 2020-02-15: qty 100

## 2020-02-15 MED ORDER — LACTATED RINGERS IV BOLUS
20.0000 mL/kg | Freq: Once | INTRAVENOUS | Status: DC
Start: 2020-02-15 — End: 2020-02-15

## 2020-02-15 MED ORDER — DEXTROSE IN LACTATED RINGERS 5 % IV SOLN: INTRAVENOUS | Status: DC

## 2020-02-15 MED ORDER — DEXTROSE 50 % IV SOLN: 0.0000 mL | INTRAVENOUS | Status: DC | PRN

## 2020-02-15 MED ORDER — DIPHENHYDRAMINE HCL 50 MG/ML IJ SOLN
12.5000 mg | Freq: Once | INTRAMUSCULAR | Status: AC
  Administered 2020-02-15: 12.5 mg via INTRAVENOUS
  Filled 2020-02-15: qty 1

## 2020-02-15 MED ORDER — MORPHINE SULFATE (PF) 4 MG/ML IV SOLN
6.0000 mg | Freq: Once | INTRAVENOUS | Status: AC
  Administered 2020-02-15: 6 mg via INTRAVENOUS
  Filled 2020-02-15: qty 2

## 2020-02-15 MED ORDER — HALOPERIDOL LACTATE 5 MG/ML IJ SOLN
5.0000 mg | Freq: Once | INTRAMUSCULAR | Status: AC
  Administered 2020-02-15: 5 mg via INTRAVENOUS
  Filled 2020-02-15: qty 1

## 2020-02-15 MED ORDER — SODIUM CHLORIDE 0.9 % IV BOLUS
1000.0000 mL | Freq: Once | INTRAVENOUS | Status: AC
  Administered 2020-02-15: 1000 mL via INTRAVENOUS
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
## 2020-02-15 NOTE — Discharge Instructions (Addendum)
You were seen in the emergency department for nausea, vomiting, abdominal pain and seizures  Initially your glucose and metabolic panel were abnormal consistent with diabetic ketosis however you are not in diabetic ketoacidosis or DKA  After medicines here your metabolic panel normalized  Resume all your medicines.  Stay well-hydrated.  Return to the ED for worsening of your symptoms

## 2020-02-15 NOTE — ED Triage Notes (Signed)
EMS called out for  seizure like activity. Not post ictal on arrival, alert and oriented. Hx of seizures, complaint with medications. 3 days of loss of appetite, nausea, has not ate or drank, has not been able to sleep. Recent admission for DKA. CBG "High". C/o palpitations, nausea. Hr 130-135, ST, RR, 18, 100% RA. Unable to gain IV access. 134/80.

## 2020-02-15 NOTE — Progress Notes (Signed)
Inpatient Diabetes Program Recommendations  AACE/ADA: New Consensus Statement on Inpatient Glycemic Control (2015)  Target Ranges:  Prepandial:   less than 140 mg/dL      Peak postprandial:   less than 180 mg/dL (1-2 hours)      Critically ill patients:  140 - 180 mg/dL   Lab Results  Component Value Date   GLUCAP 505 (HH) 02/15/2020   HGBA1C 11.7 (H) 11/25/2019    Review of Glycemic Control Results for Nancy [name], Nancy [name] (MRN [id number]) as of 02/15/2020 11:09  Ref. Range 02/27/2019 02:10 08/06/2019 17:10 11/25/2019 00:42  Hemoglobin A1C Latest Ref Range: 4.8 - 5.6 % 13.4 (H) 11.8 (H) 11.7 (H)    DKA/Seizures Diabetes history: DM  Outpatient Diabetes medications: Lantus 70 units qhs, Novolog 15 units before meals Current orders for Inpatient glycemic control:  IV insulin   PT well known to our team. A1c 11.7 on 11/25/19. Diabetes Coordinator counseled pt at that time. Was facing insurance issues at that time. Was encouraged to follow up with Endocrinology.  Pt has had 10 ED visits/admissions in the last 6 months.  Currently on IV insulin. glucose 505 on presentation.   Thanks,  Christena Deem RN, MSN, BC-ADM Inpatient Diabetes Coordinator Team Pager [phone] (8a-5p)

## 2020-02-15 NOTE — ED Notes (Signed)
Pt given small cup of Ice chips-- denies nausea at present

## 2020-02-19 ENCOUNTER — Other Ambulatory Visit: Payer: Self-pay

## 2020-02-19 ENCOUNTER — Encounter (HOSPITAL_COMMUNITY): Payer: Self-pay | Admitting: Emergency Medicine

## 2020-02-19 ENCOUNTER — Emergency Department (HOSPITAL_COMMUNITY): Payer: Medicaid Other

## 2020-02-19 ENCOUNTER — Emergency Department (HOSPITAL_COMMUNITY)
Admission: EM | Admit: 2020-02-19 | Discharge: 2020-02-19 | Disposition: A | Discharge status: Home / Self Care | Payer: Medicaid Other | Attending: Emergency Medicine | Admitting: Emergency Medicine

## 2020-02-19 DIAGNOSIS — I509 Heart failure, unspecified: Secondary | ICD-10-CM | POA: Diagnosis not present

## 2020-02-19 DIAGNOSIS — R000 Tachycardia, unspecified: Secondary | ICD-10-CM | POA: Insufficient documentation

## 2020-02-19 DIAGNOSIS — I110 Hypertensive heart disease with heart failure: Secondary | ICD-10-CM | POA: Insufficient documentation

## 2020-02-19 DIAGNOSIS — E1010 Type 1 diabetes mellitus with ketoacidosis without coma: Secondary | ICD-10-CM | POA: Insufficient documentation

## 2020-02-19 DIAGNOSIS — Z794 Long term (current) use of insulin: Secondary | ICD-10-CM | POA: Insufficient documentation

## 2020-02-19 DIAGNOSIS — R1110 Vomiting, unspecified: Secondary | ICD-10-CM

## 2020-02-19 DIAGNOSIS — R739 Hyperglycemia, unspecified: Secondary | ICD-10-CM

## 2020-02-19 DIAGNOSIS — R109 Unspecified abdominal pain: Secondary | ICD-10-CM | POA: Diagnosis not present

## 2020-02-19 DIAGNOSIS — Z79899 Other long term (current) drug therapy: Secondary | ICD-10-CM | POA: Insufficient documentation

## 2020-02-19 DIAGNOSIS — R112 Nausea with vomiting, unspecified: Secondary | ICD-10-CM | POA: Diagnosis present

## 2020-02-19 LAB — I-STAT VENOUS BLOOD GAS, ED
Acid-Base Excess: 5 mmol/L — ABNORMAL HIGH (ref 0.0–2.0)
Bicarbonate: 25.8 mmol/L (ref 20.0–28.0)
Calcium, Ion: 1.05 mmol/L — ABNORMAL LOW (ref 1.15–1.40)
HCT: 42 % (ref 36.0–46.0)
Hemoglobin: 14.3 g/dL (ref 12.0–15.0)
O2 Saturation: 100 %
Potassium: 3.5 mmol/L (ref 3.5–5.1)
Sodium: 134 mmol/L — ABNORMAL LOW (ref 135–145)
TCO2: 27 mmol/L (ref 22–32)
pCO2, Ven: 27.7 mmHg — ABNORMAL LOW (ref 44.0–60.0)
pH, Ven: 7.578 — ABNORMAL HIGH (ref 7.250–7.430)
pO2, Ven: 197 mmHg — ABNORMAL HIGH (ref 32.0–45.0)

## 2020-02-19 LAB — I-STAT CHEM 8, ED
BUN: 6 mg/dL (ref 6–20)
Calcium, Ion: 1.07 mmol/L — ABNORMAL LOW (ref 1.15–1.40)
Chloride: 99 mmol/L (ref 98–111)
Creatinine, Ser: 0.4 mg/dL — ABNORMAL LOW (ref 0.44–1.00)
Glucose, Bld: 449 mg/dL — ABNORMAL HIGH (ref 70–99)
HCT: 41 % (ref 36.0–46.0)
Hemoglobin: 13.9 g/dL (ref 12.0–15.0)
Potassium: 3.4 mmol/L — ABNORMAL LOW (ref 3.5–5.1)
Sodium: 135 mmol/L (ref 135–145)
TCO2: 21 mmol/L — ABNORMAL LOW (ref 22–32)

## 2020-02-19 LAB — BASIC METABOLIC PANEL
Anion gap: 15 (ref 5–15)
Anion gap: 8 (ref 5–15)
BUN: 6 mg/dL (ref 6–20)
BUN: <5 mg/dL — ABNORMAL LOW (ref 6–20)
CO2: 21 mmol/L — ABNORMAL LOW (ref 22–32)
CO2: 25 mmol/L (ref 22–32)
Calcium: 9.5 mg/dL (ref 8.9–10.3)
Calcium: 9 mg/dL (ref 8.9–10.3)
Chloride: 97 mmol/L — ABNORMAL LOW (ref 98–111)
Chloride: 104 mmol/L (ref 98–111)
Creatinine, Ser: 0.66 mg/dL (ref 0.44–1.00)
Creatinine, Ser: 0.54 mg/dL (ref 0.44–1.00)
GFR, Estimated: >60 mL/min (ref >60)
GFR, Estimated: >60 mL/min (ref >60)
Glucose, Bld: 446 mg/dL — ABNORMAL HIGH (ref 70–99)
Glucose, Bld: 206 mg/dL — ABNORMAL HIGH (ref 70–99)
Potassium: 3.4 mmol/L — ABNORMAL LOW (ref 3.5–5.1)
Potassium: 3.8 mmol/L (ref 3.5–5.1)
Sodium: 133 mmol/L — ABNORMAL LOW (ref 135–145)
Sodium: 137 mmol/L (ref 135–145)

## 2020-02-19 LAB — CBC WITH DIFFERENTIAL/PLATELET
Abs Immature Granulocytes: 0.03 10*3/uL (ref 0.00–0.07)
Basophils Absolute: 0 10*3/uL (ref 0.0–0.1)
Basophils Relative: 0 %
Eosinophils Absolute: 0 10*3/uL (ref 0.0–0.5)
Eosinophils Relative: 0 %
HCT: 40.9 % (ref 36.0–46.0)
Hemoglobin: 12.9 g/dL (ref 12.0–15.0)
Immature Granulocytes: 0 %
Lymphocytes Relative: 15 %
Lymphs Abs: 1.5 10*3/uL (ref 0.7–4.0)
MCH: 24.3 pg — ABNORMAL LOW (ref 26.0–34.0)
MCHC: 31.5 g/dL (ref 30.0–36.0)
MCV: 77.2 fL — ABNORMAL LOW (ref 80.0–100.0)
Monocytes Absolute: 0.5 10*3/uL (ref 0.1–1.0)
Monocytes Relative: 5 %
Neutro Abs: 7.9 10*3/uL — ABNORMAL HIGH (ref 1.7–7.7)
Neutrophils Relative %: 80 %
Platelets: 374 10*3/uL (ref 150–400)
RBC: 5.3 MIL/uL — ABNORMAL HIGH (ref 3.87–5.11)
RDW: 15 % (ref 11.5–15.5)
WBC: 9.8 10*3/uL (ref 4.0–10.5)
nRBC: 0 % (ref 0.0–0.2)

## 2020-02-19 LAB — CBG MONITORING, ED
Glucose-Capillary: 436 mg/dL — ABNORMAL HIGH (ref 70–99)
Glucose-Capillary: 399 mg/dL — ABNORMAL HIGH (ref 70–99)
Glucose-Capillary: 314 mg/dL — ABNORMAL HIGH (ref 70–99)
Glucose-Capillary: 188 mg/dL — ABNORMAL HIGH (ref 70–99)
Glucose-Capillary: 233 mg/dL — ABNORMAL HIGH (ref 70–99)

## 2020-02-19 LAB — I-STAT BETA HCG BLOOD, ED (MC, WL, AP ONLY): I-stat hCG, quantitative: <5 m[IU]/mL (ref <5)

## 2020-02-19 LAB — BETA-HYDROXYBUTYRIC ACID: Beta-Hydroxybutyric Acid: 2.66 mmol/L — ABNORMAL HIGH (ref 0.05–0.27)

## 2020-02-19 MED ORDER — LACTATED RINGERS IV SOLN: INTRAVENOUS | Status: DC

## 2020-02-19 MED ORDER — LACTATED RINGERS IV BOLUS
1000.0000 mL | Freq: Once | INTRAVENOUS | Status: AC
  Administered 2020-02-19: 1000 mL via INTRAVENOUS

## 2020-02-19 MED ORDER — SODIUM CHLORIDE 0.9% FLUSH: 10.0000 mL | INTRAVENOUS | Status: DC | PRN

## 2020-02-19 MED ORDER — MORPHINE SULFATE (PF) 2 MG/ML IV SOLN
2.0000 mg | Freq: Once | INTRAVENOUS | Status: AC
Start: 2020-02-19 — End: 2020-02-19
  Administered 2020-02-19: 2 mg via INTRAVENOUS
  Filled 2020-02-19: qty 1

## 2020-02-19 MED ORDER — ONDANSETRON HCL 4 MG/2ML IJ SOLN
4.0000 mg | Freq: Once | INTRAMUSCULAR | Status: AC
  Administered 2020-02-19: 4 mg via INTRAVENOUS
  Filled 2020-02-19: qty 2

## 2020-02-19 MED ORDER — LACTATED RINGERS IV BOLUS
20.0000 mL/kg | Freq: Once | INTRAVENOUS | Status: AC
  Administered 2020-02-19: 1750 mL via INTRAVENOUS

## 2020-02-19 MED ORDER — LIDOCAINE HCL (PF) 1 % IJ SOLN
INTRAMUSCULAR | Status: AC
  Administered 2020-02-19: 5 mL via INTRADERMAL
  Filled 2020-02-19: qty 30

## 2020-02-19 MED ORDER — HYDROMORPHONE HCL 1 MG/ML IJ SOLN
1.0000 mg | Freq: Once | INTRAMUSCULAR | Status: AC
  Administered 2020-02-19: 1 mg via INTRAVENOUS
  Filled 2020-02-19: qty 1

## 2020-02-19 MED ORDER — LIDOCAINE HCL (PF) 1 % IJ SOLN: 5.0000 mL | Freq: Once | INTRAMUSCULAR | Status: AC

## 2020-02-19 MED ORDER — DEXTROSE 50 % IV SOLN: 0.0000 mL | INTRAVENOUS | Status: DC | PRN

## 2020-02-19 MED ORDER — LACTATED RINGERS IV BOLUS: 20.0000 mL/kg | Freq: Once | INTRAVENOUS | Status: DC

## 2020-02-19 MED ORDER — POTASSIUM CHLORIDE 10 MEQ/100ML IV SOLN
10.0000 meq | INTRAVENOUS | Status: AC
  Administered 2020-02-19 (×4): 10 meq via INTRAVENOUS
  Filled 2020-02-19 (×4): qty 100

## 2020-02-19 MED ORDER — DEXTROSE IN LACTATED RINGERS 5 % IV SOLN: INTRAVENOUS | Status: DC

## 2020-02-19 MED ORDER — INSULIN REGULAR(HUMAN) IN NACL 100-0.9 UT/100ML-% IV SOLN
INTRAVENOUS | Status: DC
  Administered 2020-02-19: 13 [IU]/h via INTRAVENOUS
  Filled 2020-02-19: qty 100

## 2020-02-19 NOTE — ED Provider Notes (Signed)
[facility] EMERGENCY DEPARTMENT Provider Note   CSN: [id number] Arrival date & time: 02/19/20  8099     History No chief complaint on file.   Nancy [name] is a 20 y.o. female.  HPI   20 year old female history of type 1 diabetes, DKA, anxiety, CHF, obesity, presents today complaining of nausea and vomiting for several days.  States she has vomited innumerable times.  She denies any bright red blood or coffee-ground emesis.  She is complaining of abdominal pain.  She has had similar episodes in the past.  She denies fever or chills.  She has been taking her medications.  Primary care doctor is Dr. Tyson Dense.  Past Medical History:  Diagnosis Date  . Acanthosis nigricans   . Anxiety   . CHF (congestive heart failure) (HCC)   . Chronic lower back pain   . Depression   . DKA, type 1 (HCC) 09/13/2018  . Dyspepsia   . Obesity   . Ovarian cyst    pt is not aware of this hx (11/24/2017)  . 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 Premature baby   . Seizures (HCC)   . Type II diabetes mellitus (HCC)    insulin dependant    Patient Active Problem List   Diagnosis Date Noted  . SIRS (systemic inflammatory response syndrome) (HCC) 01/14/2020  . Diabetic gastroparesis (HCC) 11/25/2019  . Polysubstance abuse (HCC) 11/25/2019  . Gastritis 11/25/2019  . DKA, type 1 (HCC) 11/24/2019  . Cyclical vomiting with nausea 10/01/2019  . Intractable nausea and vomiting 09/30/2019  . Cellulitis   . DKA (diabetic ketoacidoses) 01/27/2019  . Leukocytosis 12/19/2018  . Dehydration   . Pseudoseizures (HCC)   . Type 1 diabetes mellitus with ketoacidosis without coma (HCC) 11/28/2018  . Hyperglycemia due to diabetes mellitus (HCC) 11/27/2018  . 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  . MDD (major depressive disorder), recurrent episode, severe (HCC) 11/06/2018  . Nonspecific abnormal electrocardiogram (ECG) (EKG)   . Chest pain of uncertain  etiology   . Hypertensive urgency 10/08/2018  . Conversion disorder with attacks or seizures, acute episode, with psychological stressor 09/16/2018  . MDD (major depressive disorder), recurrent severe, without psychosis (HCC)   . AKI (acute kidney injury) (HCC) 08/04/2018  . Seizure-like activity (HCC) 08/03/2018  . Depression 07/25/2018  . Syncope 01/30/2018  . Orthostatic hypotension 01/24/2018  . Tachycardia 12/28/2017  . Chronic abdominal pain 12/24/2017  . Chest pain 12/19/2017  . Nausea and vomiting 08/21/2017  . Generalized abdominal pain 08/21/2017  . Non compliance with medical treatment 01/27/2012  . Adjustment disorder 09/16/2011  . Acanthosis nigricans   . Goiter   . Obesity 06/14/2010  . Hypertension 06/14/2010    Past Surgical History:  Procedure Laterality Date  . ABDOMINAL HERNIA REPAIR     "I was a baby"  . BIOPSY  10/12/2018   Procedure: BIOPSY;  Surgeon: Lynann Bologna, MD;  Location: [facility] ENDOSCOPY;  Service: Endoscopy;;  . ESOPHAGOGASTRODUODENOSCOPY (EGD) WITH PROPOFOL N/A 10/12/2018   Procedure: ESOPHAGOGASTRODUODENOSCOPY (EGD) WITH PROPOFOL;  Surgeon: Lynann Bologna, MD;  Location: [facility] ENDOSCOPY;  Service: Endoscopy;  Laterality: N/A;  . HERNIA REPAIR    . LEFT HEART CATH AND CORONARY ANGIOGRAPHY N/A 10/13/2018   Procedure: LEFT HEART CATH AND CORONARY ANGIOGRAPHY;  Surgeon: Kathleene Hazel, MD;  Location: [facility] INVASIVE CV LAB;  Service: Cardiovascular;  Laterality: N/A;  . TONSILLECTOMY AND ADENOIDECTOMY    . WISDOM TOOTH EXTRACTION  2017  OB History   No obstetric history on file.     Family History  Problem Relation Age of Onset  . Diabetes Mother   . Hypertension Mother   . Obesity Mother   . Asthma Mother   . Allergic rhinitis Mother   . Eczema Mother   . Cervical cancer Mother   . Diabetes Father   . Hypertension Father   . Obesity Father   . Hyperlipidemia Father   . Hypertension Paternal Aunt   . Hypertension Maternal Grandfather    . Colon cancer Maternal Grandfather   . Diabetes Paternal Grandmother   . Obesity Paternal Grandmother   . Diabetes Paternal Grandfather   . Obesity Paternal Grandfather   . Angioedema Neg Hx   . Immunodeficiency Neg Hx   . Urticaria Neg Hx   . Stomach cancer Neg Hx   . Esophageal cancer Neg Hx     Social History   Tobacco Use  . Smoking status: Never Smoker  . Smokeless tobacco: Never Used  Vaping Use  . Vaping Use: Never used  Substance Use Topics  . Alcohol use: No    Alcohol/week: 0.0 standard drinks  . Drug use: No    Home Medications Prior to Admission medications   Medication Sig Start Date End Date Taking? Authorizing Provider  amoxicillin (AMOXIL) 500 MG tablet Take 500 mg by mouth See admin instructions. 02/14/20   [provider]  ARIPiprazole (ABILIFY) 10 MG tablet Take 10 mg by mouth in the morning and at bedtime.     [provider]  atorvastatin (LIPITOR) 20 MG tablet Take 1 tablet (20 mg total) by mouth daily at 6 PM. Patient taking differently: Take 20 mg by mouth daily. 10/05/18   Storm Frisk, MD  busPIRone (BUSPAR) 10 MG tablet Take 10 mg by mouth 2 (two) times daily.  11/19/18   [provider]  Cyanocobalamin (VITAMIN B-12 PO) Take 1 tablet by mouth daily.    [provider]  diclofenac Sodium (VOLTAREN) 1 % GEL Apply 1 application topically 2 (two) times daily as needed (chest pains).    [provider]  dicyclomine (BENTYL) 10 MG capsule Take 1 capsule (10 mg total) by mouth 4 (four) times daily as needed for spasms. 11/21/19   Linwood Dibbles, MD  insulin aspart (NOVOLOG FLEXPEN) 100 UNIT/ML FlexPen Inject 15 Units into the skin See admin instructions. Inject 15 units subcutaneously up to three times daily after meals    [provider]  insulin glargine (LANTUS) 100 unit/mL SOPN Inject 70 Units into the skin at bedtime.    [provider]  lamoTRIgine (LAMICTAL) 25 MG tablet Take 2 tablets  (50 mg total) by mouth 2 (two) times daily. For mood stabilization Patient taking differently: Take 25 mg by mouth 2 (two) times daily. For mood stabilization 11/11/18   Nwoko, Nicole Kindred I, NP  lidocaine (LIDODERM) 5 % Place 1 patch onto the skin daily as needed (pain). Remove & Discard patch within 12 hours or as directed by MD    [provider]  lubiprostone (AMITIZA) 24 MCG capsule Take 1 capsule (24 mcg total) by mouth 2 (two) times daily with a meal. 10/27/18   Esterwood, Amy S, PA-C  metoCLOPramide (REGLAN) 5 MG tablet Take 1 tablet (5 mg total) by mouth 3 (three) times daily. 10/03/19   Jerald Kief, MD  mirtazapine (REMERON) 7.5 MG tablet Take 1 tablet (7.5 mg total) by mouth at bedtime. For depression/sleep 11/11/18  Armandina Stammer I, NP  Multiple Vitamin (MULTIVITAMIN WITH MINERALS) TABS tablet Take 1 tablet by mouth daily. 09/18/18   Clapacs, Jackquline Denmark, MD  Olopatadine HCl (PAZEO) 0.7 % SOLN Place 2 drops into both eyes 2 (two) times daily as needed (allergies).    [provider]  pantoprazole (PROTONIX) 40 MG tablet Take 1 tablet (40 mg total) by mouth 2 (two) times daily. For acid reflux 01/29/19   Jae Dire, MD  polyethylene glycol (MIRALAX / GLYCOLAX) 17 g packet Take 17 g by mouth 2 (two) times daily. 01/16/20   Swayze, Ava, DO  prochlorperazine (COMPAZINE) 25 MG suppository Place 1 suppository (25 mg total) rectally every 12 (twelve) hours as needed for nausea or vomiting. 12/05/18   Melene Plan, DO  propranolol (INDERAL) 20 MG tablet Take 1 tablet (20 mg total) by mouth 2 (two) times daily. For anxiety/HTN 12/23/18   Swayze, Ava, DO  QUEtiapine (SEROQUEL) 100 MG tablet Take 300 mg by mouth at bedtime. 07/05/19   [provider]  QUEtiapine (SEROQUEL) 25 MG tablet Take 25 mg by mouth every morning.    [provider]  senna-docusate (SENOKOT-S) 8.6-50 MG tablet Take 1 tablet by mouth 2 (two) times daily. 01/16/20   Swayze, Ava, DO  sucralfate (CARAFATE) 1  GM/10ML suspension Take 1 g by mouth at bedtime as needed (stomach pain). 07/05/19   [provider]  topiramate (TOPAMAX) 25 MG tablet Take 1 tablet (25 mg total) by mouth 2 (two) times daily. For seizure activities 11/11/18   Armandina Stammer I, NP  ULTRAM 50 MG tablet Take 50 mg by mouth every 6 (six) hours. 02/14/20   [provider]    Allergies    Tomato, Ibuprofen, and Oatmeal  Review of Systems   Review of Systems  Constitutional: Positive for fatigue.  Gastrointestinal: Positive for abdominal pain, nausea and vomiting.  All other systems reviewed and are negative.   Physical Exam Updated Vital Signs BP (!) 152/95   Pulse (!) 107   Temp 98.8 F (37.1 C) (Oral)   Resp 16   Ht 1.6 m (5\' 3" )   Wt 87.5 kg   SpO2 100%   BMI 34.19 kg/m   Physical Exam Vitals reviewed.  Constitutional:      General: She is in acute distress.     Appearance: She is obese. She is ill-appearing.  HENT:     Head: Normocephalic.     Right Ear: External ear normal.     Left Ear: External ear normal.     Nose: Nose normal.     Mouth/Throat:     Mouth: Mucous membranes are dry.  Eyes:     Pupils: Pupils are equal, round, and reactive to light.  Cardiovascular:     Rate and Rhythm: Tachycardia present.     Pulses: Normal pulses.  Pulmonary:     Effort: Pulmonary effort is normal.     Breath sounds: Normal breath sounds.  Abdominal:     General: Abdomen is flat.     Palpations: Abdomen is soft.     Tenderness: There is abdominal tenderness.  Musculoskeletal:        General: Normal range of motion.     Cervical back: Normal range of motion.  Skin:    General: Skin is warm and dry.     Capillary Refill: Capillary refill takes less than 2 seconds.  Neurological:     General: No focal deficit present.     Mental  Status: She is alert.  Psychiatric:        Mood and Affect: Mood normal.     ED Results / Procedures / Treatments   Labs (all labs ordered are listed, but only  abnormal results are displayed) Labs Reviewed  CBG MONITORING, ED - Abnormal; Notable for the following components:      Result Value   Glucose-Capillary 436 (*)    All other components within normal limits  I-STAT CHEM 8, ED    EKG None  Radiology No results found.  Procedures Procedures   Medications Ordered in ED Medications  lactated ringers bolus 1,000 mL (has no administration in time range)    ED Course  I have reviewed the triage vital signs and the nursing notes.  Pertinent labs & imaging results that were available during my care of the patient were reviewed by me and considered in my medical decision making (see chart for details).    MDM Rules/Calculators/A&P                         20 year old female history of type 1 diabetes presents today with hyperglycemia nausea and vomiting.  She does not appear to be in DKA.  pH is 7.57 PCO2 of 27.  She was fluid resuscitated here in the ED and placed on insulin drip.  Her blood sugars have gone down to 188.  Patient feels greatly improved and is taking p.o. without difficulty.  She wishes discharge.  We have discussed return precautions and need for close follow-up and she voiced understanding.  She states she has all her medications at home that she needs including antiemetics. Final Clinical Impression(s) / ED Diagnoses Final diagnoses:  Vomiting  Hyperglycemia    Rx / DC Orders ED Discharge Orders    None       Margarita Grizzle, MD 02/19/20 1356

## 2020-02-19 NOTE — Discharge Instructions (Addendum)
Please use Zofran for nausea. Take all your medications as prescribed Return the emergency department you have worsening nausea vomiting or blood sugars out of control Please follow-up with your doctor as soon as possible

## 2020-02-19 NOTE — ED Triage Notes (Signed)
Pt bib EMS from home for complaints of vomiting and hyperglycemia. Pt states she has thrown up 20 times this morning. Pt has Hx of uncontrolled diabetes. Recently discharged 4 days ago for similar symptoms.  Vitals: 160/110 BP 120 HR 100% RA 20 RR 432 CBG

## 2020-02-19 NOTE — ED Notes (Signed)
Patient verbalizes understanding of discharge instructions. Opportunity for questioning and answers were provided. Armband removed by staff, pt discharged from ED.  

## 2020-02-21 ENCOUNTER — Inpatient Hospital Stay (HOSPITAL_COMMUNITY)
Admission: EM | Admit: 2020-02-21 | Discharge: 2020-02-29 | DRG: 392 | Disposition: A | Discharge status: Home / Self Care | Payer: Medicaid Other | Attending: Internal Medicine | Admitting: Internal Medicine

## 2020-02-21 ENCOUNTER — Emergency Department (HOSPITAL_COMMUNITY): Payer: Medicaid Other

## 2020-02-21 ENCOUNTER — Other Ambulatory Visit: Payer: Self-pay

## 2020-02-21 DIAGNOSIS — M545 Low back pain, unspecified: Secondary | ICD-10-CM | POA: Diagnosis present

## 2020-02-21 DIAGNOSIS — K5900 Constipation, unspecified: Secondary | ICD-10-CM

## 2020-02-21 DIAGNOSIS — E669 Obesity, unspecified: Secondary | ICD-10-CM | POA: Diagnosis present

## 2020-02-21 DIAGNOSIS — Z794 Long term (current) use of insulin: Secondary | ICD-10-CM

## 2020-02-21 DIAGNOSIS — Z20822 Contact with and (suspected) exposure to covid-19: Secondary | ICD-10-CM | POA: Diagnosis present

## 2020-02-21 DIAGNOSIS — E1143 Type 2 diabetes mellitus with diabetic autonomic (poly)neuropathy: Secondary | ICD-10-CM

## 2020-02-21 DIAGNOSIS — R739 Hyperglycemia, unspecified: Secondary | ICD-10-CM

## 2020-02-21 DIAGNOSIS — E1043 Type 1 diabetes mellitus with diabetic autonomic (poly)neuropathy: Secondary | ICD-10-CM | POA: Diagnosis present

## 2020-02-21 DIAGNOSIS — E1065 Type 1 diabetes mellitus with hyperglycemia: Secondary | ICD-10-CM | POA: Diagnosis present

## 2020-02-21 DIAGNOSIS — Z886 Allergy status to analgesic agent status: Secondary | ICD-10-CM

## 2020-02-21 DIAGNOSIS — F449 Dissociative and conversion disorder, unspecified: Secondary | ICD-10-CM | POA: Diagnosis present

## 2020-02-21 DIAGNOSIS — L83 Acanthosis nigricans: Secondary | ICD-10-CM | POA: Diagnosis present

## 2020-02-21 DIAGNOSIS — K5909 Other constipation: Secondary | ICD-10-CM

## 2020-02-21 DIAGNOSIS — Z833 Family history of diabetes mellitus: Secondary | ICD-10-CM

## 2020-02-21 DIAGNOSIS — R000 Tachycardia, unspecified: Secondary | ICD-10-CM | POA: Diagnosis present

## 2020-02-21 DIAGNOSIS — E873 Alkalosis: Secondary | ICD-10-CM | POA: Diagnosis present

## 2020-02-21 DIAGNOSIS — G40909 Epilepsy, unspecified, not intractable, without status epilepticus: Secondary | ICD-10-CM | POA: Diagnosis present

## 2020-02-21 DIAGNOSIS — R1084 Generalized abdominal pain: Secondary | ICD-10-CM

## 2020-02-21 DIAGNOSIS — R8290 Unspecified abnormal findings in urine: Secondary | ICD-10-CM | POA: Diagnosis present

## 2020-02-21 DIAGNOSIS — K5901 Slow transit constipation: Principal | ICD-10-CM | POA: Diagnosis present

## 2020-02-21 DIAGNOSIS — R718 Other abnormality of red blood cells: Secondary | ICD-10-CM | POA: Diagnosis present

## 2020-02-21 DIAGNOSIS — Z8249 Family history of ischemic heart disease and other diseases of the circulatory system: Secondary | ICD-10-CM

## 2020-02-21 DIAGNOSIS — K3184 Gastroparesis: Secondary | ICD-10-CM | POA: Diagnosis present

## 2020-02-21 DIAGNOSIS — Z79899 Other long term (current) drug therapy: Secondary | ICD-10-CM

## 2020-02-21 DIAGNOSIS — E10649 Type 1 diabetes mellitus with hypoglycemia without coma: Secondary | ICD-10-CM | POA: Diagnosis not present

## 2020-02-21 DIAGNOSIS — E785 Hyperlipidemia, unspecified: Secondary | ICD-10-CM | POA: Diagnosis present

## 2020-02-21 DIAGNOSIS — E876 Hypokalemia: Secondary | ICD-10-CM | POA: Diagnosis present

## 2020-02-21 DIAGNOSIS — G8929 Other chronic pain: Secondary | ICD-10-CM | POA: Diagnosis present

## 2020-02-21 DIAGNOSIS — R8271 Bacteriuria: Secondary | ICD-10-CM | POA: Diagnosis present

## 2020-02-21 DIAGNOSIS — F1290 Cannabis use, unspecified, uncomplicated: Secondary | ICD-10-CM | POA: Diagnosis present

## 2020-02-21 DIAGNOSIS — I509 Heart failure, unspecified: Secondary | ICD-10-CM | POA: Diagnosis present

## 2020-02-21 DIAGNOSIS — R079 Chest pain, unspecified: Secondary | ICD-10-CM | POA: Diagnosis present

## 2020-02-21 DIAGNOSIS — Z91018 Allergy to other foods: Secondary | ICD-10-CM

## 2020-02-21 DIAGNOSIS — Z83438 Family history of other disorder of lipoprotein metabolism and other lipidemia: Secondary | ICD-10-CM

## 2020-02-21 DIAGNOSIS — F32A Depression, unspecified: Secondary | ICD-10-CM | POA: Diagnosis present

## 2020-02-21 DIAGNOSIS — R109 Unspecified abdominal pain: Secondary | ICD-10-CM

## 2020-02-21 DIAGNOSIS — Z6834 Body mass index (BMI) 34.0-34.9, adult: Secondary | ICD-10-CM

## 2020-02-21 DIAGNOSIS — F419 Anxiety disorder, unspecified: Secondary | ICD-10-CM | POA: Diagnosis present

## 2020-02-21 DIAGNOSIS — I110 Hypertensive heart disease with heart failure: Secondary | ICD-10-CM | POA: Diagnosis present

## 2020-02-21 DIAGNOSIS — R112 Nausea with vomiting, unspecified: Secondary | ICD-10-CM | POA: Diagnosis present

## 2020-02-21 LAB — I-STAT VENOUS BLOOD GAS, ED
Acid-Base Excess: 8 mmol/L — ABNORMAL HIGH (ref 0.0–2.0)
Bicarbonate: 26.3 mmol/L (ref 20.0–28.0)
Calcium, Ion: 0.96 mmol/L — ABNORMAL LOW (ref 1.15–1.40)
HCT: 42 % (ref 36.0–46.0)
Hemoglobin: 14.3 g/dL (ref 12.0–15.0)
O2 Saturation: 98 %
Potassium: 3.6 mmol/L (ref 3.5–5.1)
Sodium: 136 mmol/L (ref 135–145)
TCO2: 27 mmol/L (ref 22–32)
pCO2, Ven: 21.5 mmHg — ABNORMAL LOW (ref 44.0–60.0)
pH, Ven: 7.697 (ref 7.250–7.430)
pO2, Ven: 73 mmHg — ABNORMAL HIGH (ref 32.0–45.0)

## 2020-02-21 LAB — COMPREHENSIVE METABOLIC PANEL
ALT: 15 U/L (ref 0–44)
AST: 23 U/L (ref 15–41)
Albumin: 4.1 g/dL (ref 3.5–5.0)
Alkaline Phosphatase: 86 U/L (ref 38–126)
Anion gap: 14 (ref 5–15)
BUN: 7 mg/dL (ref 6–20)
CO2: 25 mmol/L (ref 22–32)
Calcium: 9.7 mg/dL (ref 8.9–10.3)
Chloride: 98 mmol/L (ref 98–111)
Creatinine, Ser: 0.71 mg/dL (ref 0.44–1.00)
GFR, Estimated: >60 mL/min (ref >60)
Glucose, Bld: 327 mg/dL — ABNORMAL HIGH (ref 70–99)
Potassium: 3.7 mmol/L (ref 3.5–5.1)
Sodium: 137 mmol/L (ref 135–145)
Total Bilirubin: 1.2 mg/dL (ref 0.3–1.2)
Total Protein: 7 g/dL (ref 6.5–8.1)

## 2020-02-21 LAB — LIPASE, BLOOD: Lipase: 30 U/L (ref 11–51)

## 2020-02-21 LAB — CBC
HCT: 43 % (ref 36.0–46.0)
Hemoglobin: 13.7 g/dL (ref 12.0–15.0)
MCH: 24.7 pg — ABNORMAL LOW (ref 26.0–34.0)
MCHC: 31.9 g/dL (ref 30.0–36.0)
MCV: 77.5 fL — ABNORMAL LOW (ref 80.0–100.0)
Platelets: 403 10*3/uL — ABNORMAL HIGH (ref 150–400)
RBC: 5.55 MIL/uL — ABNORMAL HIGH (ref 3.87–5.11)
RDW: 15.5 % (ref 11.5–15.5)
WBC: 11.1 10*3/uL — ABNORMAL HIGH (ref 4.0–10.5)
nRBC: 0 % (ref 0.0–0.2)

## 2020-02-21 LAB — TROPONIN I (HIGH SENSITIVITY): Troponin I (High Sensitivity): 4 ng/L (ref <18)

## 2020-02-21 LAB — MAGNESIUM: Magnesium: 1.9 mg/dL (ref 1.7–2.4)

## 2020-02-21 LAB — BETA-HYDROXYBUTYRIC ACID: Beta-Hydroxybutyric Acid: 2.62 mmol/L — ABNORMAL HIGH (ref 0.05–0.27)

## 2020-02-21 MED ORDER — FAMOTIDINE IN NACL 20-0.9 MG/50ML-% IV SOLN
20.0000 mg | Freq: Once | INTRAVENOUS | Status: AC
  Administered 2020-02-21: 20 mg via INTRAVENOUS
  Filled 2020-02-21: qty 50

## 2020-02-21 MED ORDER — MORPHINE SULFATE (PF) 4 MG/ML IV SOLN
4.0000 mg | Freq: Once | INTRAVENOUS | Status: AC
  Administered 2020-02-21: 4 mg via INTRAVENOUS
  Filled 2020-02-21: qty 1

## 2020-02-21 MED ORDER — METOCLOPRAMIDE HCL 5 MG/ML IJ SOLN
10.0000 mg | Freq: Once | INTRAMUSCULAR | Status: AC
  Administered 2020-02-21: 10 mg via INTRAVENOUS
  Filled 2020-02-21: qty 2

## 2020-02-21 MED ORDER — INSULIN ASPART 100 UNIT/ML ~~LOC~~ SOLN
7.0000 [IU] | Freq: Once | SUBCUTANEOUS | Status: AC
  Administered 2020-02-22: 7 [IU] via SUBCUTANEOUS

## 2020-02-21 MED ORDER — LACTATED RINGERS IV BOLUS
1000.0000 mL | Freq: Once | INTRAVENOUS | Status: AC
  Administered 2020-02-21: 1000 mL via INTRAVENOUS

## 2020-02-21 MED ORDER — DIPHENHYDRAMINE HCL 50 MG/ML IJ SOLN
12.5000 mg | Freq: Once | INTRAMUSCULAR | Status: AC
  Administered 2020-02-21: 12.5 mg via INTRAVENOUS
  Filled 2020-02-21: qty 1

## 2020-02-21 NOTE — ED Provider Notes (Signed)
[facility] EMERGENCY DEPARTMENT Provider Note   CSN: [id number] Arrival date & time: 02/21/20  1912     History Chief Complaint  Patient presents with  . Abdominal Pain    Nancy [name] is a 20 y.o. female with a past medical history of anxiety, CHF, DM one, diabetic gastroparesis, marijuana use, cyclic vomiting, hypertension, conversion disorder, pseudoseizures, seizures, who presents today for evaluation of abdominal pain.  She was seen here on 2/9 and 2/13 both times of which she was discharged.  She has now been having a worsening of her chronic abdominal pain for about 10 days now.  She states that her blood sugars have been running high in the 3-4 100s.  She reports that she is having increased seizures, and will have seven or eight a day.  She reports compliance with her antiepileptics.    She denies any fevers.  Chart review shows that when she was seen on 2/13 her chest x-ray was abnormal showing a subtle ill-defined airspace opacity in the right lower lung with recommendation of correlation with Covid status.  She reports that her chest hurts.  She describes this as feeling consistent with her chronic and usual abdominal pain however is more severe.  She denies any constipation or diarrhea.  She reports that her seizures have been increased for "a while."  She states that her abdomen hurts the same "all over".   She reports that she feels like she needs to have a good BM.  She reports chronic constipation and no BM for 3 weeks.  This is not abnormal for her but she thinks that may be contributing to her pain.   HPI     Past Medical History:  Diagnosis Date  . Acanthosis nigricans   . Anxiety   . CHF (congestive heart failure) (HCC)   . Chronic lower back pain   . Depression   . DKA, type 1 (HCC) 09/13/2018  . Dyspepsia   . Obesity   . Ovarian cyst    pt is not aware of this hx (11/24/2017)  . 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 Premature baby   .  Seizures (HCC)   . Type II diabetes mellitus (HCC)    insulin dependant    Patient Active Problem List   Diagnosis Date Noted  . SIRS (systemic inflammatory response syndrome) (HCC) 01/14/2020  . Diabetic gastroparesis (HCC) 11/25/2019  . Polysubstance abuse (HCC) 11/25/2019  . Gastritis 11/25/2019  . DKA, type 1 (HCC) 11/24/2019  . Cyclical vomiting with nausea 10/01/2019  . Intractable nausea and vomiting 09/30/2019  . Cellulitis   . DKA (diabetic ketoacidoses) 01/27/2019  . Leukocytosis 12/19/2018  . Dehydration   . Pseudoseizures (HCC)   . Type 1 diabetes mellitus with ketoacidosis without coma (HCC) 11/28/2018  . Hyperglycemia due to diabetes mellitus (HCC) 11/27/2018  . 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  . MDD (major depressive disorder), recurrent episode, severe (HCC) 11/06/2018  . Nonspecific abnormal electrocardiogram (ECG) (EKG)   . Chest pain of uncertain etiology   . Hypertensive urgency 10/08/2018  . Conversion disorder with attacks or seizures, acute episode, with psychological stressor 09/16/2018  . MDD (major depressive disorder), recurrent severe, without psychosis (HCC)   . AKI (acute kidney injury) (HCC) 08/04/2018  . Seizure-like activity (HCC) 08/03/2018  . Depression 07/25/2018  . Syncope 01/30/2018  . Orthostatic hypotension 01/24/2018  . Tachycardia 12/28/2017  . Chronic abdominal pain 12/24/2017  . Chest pain 12/19/2017  .  Nausea and vomiting 08/21/2017  . Generalized abdominal pain 08/21/2017  . Non compliance with medical treatment 01/27/2012  . Adjustment disorder 09/16/2011  . Acanthosis nigricans   . Goiter   . Obesity 06/14/2010  . Hypertension 06/14/2010    Past Surgical History:  Procedure Laterality Date  . ABDOMINAL HERNIA REPAIR     "I was a baby"  . BIOPSY  10/12/2018   Procedure: BIOPSY;  Surgeon: Lynann Bologna, MD;  Location: [facility] ENDOSCOPY;  Service: Endoscopy;;  .  ESOPHAGOGASTRODUODENOSCOPY (EGD) WITH PROPOFOL N/A 10/12/2018   Procedure: ESOPHAGOGASTRODUODENOSCOPY (EGD) WITH PROPOFOL;  Surgeon: Lynann Bologna, MD;  Location: [facility] ENDOSCOPY;  Service: Endoscopy;  Laterality: N/A;  . HERNIA REPAIR    . LEFT HEART CATH AND CORONARY ANGIOGRAPHY N/A 10/13/2018   Procedure: LEFT HEART CATH AND CORONARY ANGIOGRAPHY;  Surgeon: Kathleene Hazel, MD;  Location: [facility] INVASIVE CV LAB;  Service: Cardiovascular;  Laterality: N/A;  . TONSILLECTOMY AND ADENOIDECTOMY    . WISDOM TOOTH EXTRACTION  2017     OB History   No obstetric history on file.     Family History  Problem Relation Age of Onset  . Diabetes Mother   . Hypertension Mother   . Obesity Mother   . Asthma Mother   . Allergic rhinitis Mother   . Eczema Mother   . Cervical cancer Mother   . Diabetes Father   . Hypertension Father   . Obesity Father   . Hyperlipidemia Father   . Hypertension Paternal Aunt   . Hypertension Maternal Grandfather   . Colon cancer Maternal Grandfather   . Diabetes Paternal Grandmother   . Obesity Paternal Grandmother   . Diabetes Paternal Grandfather   . Obesity Paternal Grandfather   . Angioedema Neg Hx   . Immunodeficiency Neg Hx   . Urticaria Neg Hx   . Stomach cancer Neg Hx   . Esophageal cancer Neg Hx     Social History   Tobacco Use  . Smoking status: Never Smoker  . Smokeless tobacco: Never Used  Vaping Use  . Vaping Use: Never used  Substance Use Topics  . Alcohol use: No    Alcohol/week: 0.0 standard drinks  . Drug use: No    Home Medications Prior to Admission medications   Medication Sig Start Date End Date Taking? Authorizing Provider  amoxicillin (AMOXIL) 500 MG tablet Take 500 mg by mouth See admin instructions. 02/14/20   [provider]  ARIPiprazole (ABILIFY) 10 MG tablet Take 10 mg by mouth in the morning and at bedtime.     [provider]  atorvastatin (LIPITOR) 20 MG tablet Take 1 tablet (20 mg total) by mouth  daily at 6 PM. Patient taking differently: Take 20 mg by mouth daily. 10/05/18   Storm Frisk, MD  busPIRone (BUSPAR) 10 MG tablet Take 10 mg by mouth 2 (two) times daily.  11/19/18   [provider]  Cyanocobalamin (VITAMIN B-12 PO) Take 1 tablet by mouth daily.    [provider]  diclofenac Sodium (VOLTAREN) 1 % GEL Apply 1 application topically 2 (two) times daily as needed (chest pains).    [provider]  dicyclomine (BENTYL) 10 MG capsule Take 1 capsule (10 mg total) by mouth 4 (four) times daily as needed for spasms. Patient not taking: Reported on 02/19/2020 11/21/19   Linwood Dibbles, MD  insulin aspart (NOVOLOG FLEXPEN) 100 UNIT/ML FlexPen Inject 15 Units into the skin 2 (two) times daily before a meal.  [provider]  insulin glargine (LANTUS) 100 unit/mL SOPN Inject 70 Units into the skin at bedtime.    [provider]  lamoTRIgine (LAMICTAL) 25 MG tablet Take 2 tablets (50 mg total) by mouth 2 (two) times daily. For mood stabilization 11/11/18   Nwoko, Nicole Kindred I, NP  lidocaine (LIDODERM) 5 % Place 1 patch onto the skin daily as needed (pain). Remove & Discard patch within 12 hours or as directed by MD    [provider]  lubiprostone (AMITIZA) 24 MCG capsule Take 1 capsule (24 mcg total) by mouth 2 (two) times daily with a meal. Patient taking differently: Take 24 mcg by mouth daily with breakfast. 10/27/18   Esterwood, Amy S, PA-C  metoCLOPramide (REGLAN) 5 MG tablet Take 1 tablet (5 mg total) by mouth 3 (three) times daily. 10/03/19   Jerald Kief, MD  mirtazapine (REMERON) 7.5 MG tablet Take 1 tablet (7.5 mg total) by mouth at bedtime. For depression/sleep 11/11/18   Armandina Stammer I, NP  Multiple Vitamin (MULTIVITAMIN WITH MINERALS) TABS tablet Take 1 tablet by mouth daily. 09/18/18   Clapacs, Jackquline Denmark, MD  pantoprazole (PROTONIX) 40 MG tablet Take 1 tablet (40 mg total) by mouth 2 (two) times daily. For acid reflux 01/29/19   Jae Dire, MD  polyethylene glycol (MIRALAX / GLYCOLAX) 17 g packet Take 17 g by mouth 2 (two) times daily. 01/16/20   Swayze, Ava, DO  prochlorperazine (COMPAZINE) 25 MG suppository Place 1 suppository (25 mg total) rectally every 12 (twelve) hours as needed for nausea or vomiting. 12/05/18   Melene Plan, DO  propranolol (INDERAL) 20 MG tablet Take 1 tablet (20 mg total) by mouth 2 (two) times daily. For anxiety/HTN 12/23/18   Swayze, Ava, DO  QUEtiapine (SEROQUEL) 100 MG tablet Take 300 mg by mouth at bedtime. 07/05/19   [provider]  QUEtiapine (SEROQUEL) 25 MG tablet Take 25 mg by mouth every morning.    [provider]  senna-docusate (SENOKOT-S) 8.6-50 MG tablet Take 1 tablet by mouth 2 (two) times daily. 01/16/20   Swayze, Ava, DO  sucralfate (CARAFATE) 1 GM/10ML suspension Take 1 g by mouth at bedtime as needed (stomach pain). 07/05/19   [provider]  topiramate (TOPAMAX) 25 MG tablet Take 1 tablet (25 mg total) by mouth 2 (two) times daily. For seizure activities 11/11/18   Armandina Stammer I, NP  ULTRAM 50 MG tablet Take 50 mg by mouth every 6 (six) hours as needed for moderate pain. 02/14/20   [provider]    Allergies    Tomato, Ibuprofen, and Oatmeal  Review of Systems   Review of Systems  Constitutional: Negative for chills and fever.  Respiratory: Positive for shortness of breath.   Cardiovascular: Positive for chest pain.  Gastrointestinal: Positive for abdominal pain, nausea and vomiting.  Genitourinary: Negative for dysuria.  Musculoskeletal: Negative for back pain and neck pain.  Skin: Negative for color change and rash.  Neurological: Positive for seizures. Negative for weakness and headaches.  Psychiatric/Behavioral: Negative for confusion.  All other systems reviewed and are negative.   Physical Exam Updated Vital Signs BP (!) 133/113 (BP Location: Right Arm)   Pulse (!) 110   Temp 98.8 F (37.1 C) (Oral)   Resp 13   SpO2 99%    Physical Exam Vitals and nursing note reviewed.  Constitutional:      General: She is in acute distress (Appears to be in pain, rolling around on the  bed).     Appearance: She is not diaphoretic.  HENT:     Head: Normocephalic and atraumatic.  Eyes:     General: No scleral icterus.       Right eye: No discharge.        Left eye: No discharge.     Conjunctiva/sclera: Conjunctivae normal.  Cardiovascular:     Rate and Rhythm: Regular rhythm. Tachycardia present.  Pulmonary:     Effort: Pulmonary effort is normal. No respiratory distress.     Breath sounds: No stridor.  Abdominal:     General: There is no distension.     Tenderness: There is generalized abdominal tenderness.  Musculoskeletal:        General: No deformity.     Cervical back: Normal range of motion.  Skin:    General: Skin is warm and dry.  Neurological:     General: No focal deficit present.     Mental Status: She is alert and oriented to person, place, and time.     Motor: No abnormal muscle tone.  Psychiatric:        Mood and Affect: Mood is anxious.        Behavior: Behavior normal.     ED Results / Procedures / Treatments   Labs (all labs ordered are listed, but only abnormal results are displayed) Labs Reviewed  COMPREHENSIVE METABOLIC PANEL - Abnormal; Notable for the following components:      Result Value   Glucose, Bld 327 (*)    All other components within normal limits  CBC - Abnormal; Notable for the following components:   WBC 11.1 (*)    RBC 5.55 (*)    MCV 77.5 (*)    MCH 24.7 (*)    Platelets 403 (*)    All other components within normal limits  BETA-HYDROXYBUTYRIC ACID - Abnormal; Notable for the following components:   Beta-Hydroxybutyric Acid 2.62 (*)    All other components within normal limits  I-STAT VENOUS BLOOD GAS, ED - Abnormal; Notable for the following components:   pH, Ven 7.697 (*)    pCO2, Ven 21.5 (*)    pO2, Ven 73.0 (*)    Acid-Base Excess 8.0 (*)    Calcium,  Ion 0.96 (*)    All other components within normal limits  SARS CORONAVIRUS 2 (TAT 6-24 HRS)  LIPASE, BLOOD  MAGNESIUM  URINALYSIS, ROUTINE W REFLEX MICROSCOPIC  PREGNANCY, URINE  I-STAT BETA HCG BLOOD, ED ([facility], [facility], [facility] ONLY)  CBG MONITORING, ED  TROPONIN I (HIGH SENSITIVITY)    EKG EKG Interpretation  Date/Time:  Tuesday February 21 2020 20:27:09 EST Ventricular Rate:  126 PR Interval:    QRS Duration: 82 QT Interval:  316 QTC Calculation: 458 R Axis:   77 Text Interpretation: Sinus tachycardia ST elev, probable normal early repol pattern No significant change since last tracing Confirmed by Linwood DibblesKnapp, Jon [phone]([id number]) on 02/21/2020 8:40:54 PM   Radiology DG Chest Portable 1 View  Result Date: 02/21/2020 CLINICAL DATA:  Vomiting. EXAM: PORTABLE CHEST 1 VIEW COMPARISON:  February 19, 2020 FINDINGS: The heart size and mediastinal contours are within normal limits. Both lungs are clear. The visualized skeletal structures are unremarkable. IMPRESSION: No active disease. Electronically Signed   By: Aram Candelahaddeus  Houston M.D.   On: 02/21/2020 21:12    Procedures Procedures   Medications Ordered in ED Medications  insulin aspart (novoLOG) injection 7 Units (has no administration in time range)  diphenhydrAMINE (BENADRYL) injection 12.5 mg (12.5 mg Intravenous  Given 02/21/20 2129)  metoCLOPramide (REGLAN) injection 10 mg (10 mg Intravenous Given 02/21/20 2129)  lactated ringers bolus 1,000 mL (0 mLs Intravenous Stopped 02/21/20 2328)  famotidine (PEPCID) IVPB 20 mg premix (0 mg Intravenous Stopped 02/21/20 2328)  morphine 4 MG/ML injection 4 mg (4 mg Intravenous Given 02/21/20 2128)    ED Course  I have reviewed the triage vital signs and the nursing notes.  Pertinent labs & imaging results that were available during my care of the patient were reviewed by me and considered in my medical decision making (see chart for details).  Clinical Course as of 02/22/20 0023  Tue Feb 21, 2020  2149 DG  Chest Portable 1 View No active disease, the previously seen opacity is not noted today. [EH]  2306 Patient reevaluated, she has been able to tolerate ice chips however still feels very nauseous.  She feels like this is possibly related to her constipation noting that she has not had a bowel movement in 3 weeks.  She has chronic constipation.  Will obtain KUB and admit. [EH]    Clinical Course User Index [EH] Norman Clay   MDM Rules/Calculators/A&P                         Patient is a 20 year old woman who presents today for evaluation of acute exacerbation of her worsening chronic abdominal pain.  This is her third visit for the same complaint and 6 days.  On initial exam she appears uncomfortable and is tachycardic.    CBC shows white count is slightly elevated at 11.  She is not anemic.  CMP is unremarkable except for a glucose of 327.  VBG shows alkalosis with a pH of 7.697, PCO2 of 21.5.  Patient has been hyperventilating which I suspect this combined with GI based acid loss is the cause of her alkalosis.  Chest x-ray is obtained given that she had a opacity seen on her past 2 chest x-rays, this 1 is clear without opacity or abnormality noted.  In the emergency room patient is treated with Benadryl, Reglan, morphine, Pepcid, and IV fluids.  After this she felt better however still felt nauseous.  At this point patient said that she feels like she just needs to have a good BM and she has not had one in 3 weeks and asked for an enema.   Given this is her third ED visit in the past 6days similar will obtain KUB.  Given that she has failed outpatient treatment twice in the past 6 days after discussions with patient plan to admit.  I spoke with Dr. Loney Loh who requested we get imaging results then repage her for admission.   Note: Portions of this report may have been transcribed using voice recognition software. Every effort was made to ensure accuracy; however,  inadvertent computerized transcription errors may be present   Final Clinical Impression(s) / ED Diagnoses Final diagnoses:  Constipation  Generalized abdominal pain  Diabetic gastroparesis (HCC)  Intractable nausea and vomiting    Rx / DC Orders ED Discharge Orders    None       Norman Clay 02/22/20 Valentina Lucks    Linwood Dibbles, MD 02/24/20 [phone]

## 2020-02-21 NOTE — ED Triage Notes (Signed)
Pt c/o continued abd pain x1wk, seen/DC here for same 2/13 & 2/9. Hx anxiety, CHF, DM 4mg  zofran IM given en route VSS en route, NAD in triage

## 2020-02-22 ENCOUNTER — Encounter (HOSPITAL_COMMUNITY): Payer: Self-pay | Admitting: Internal Medicine

## 2020-02-22 ENCOUNTER — Other Ambulatory Visit: Payer: Self-pay

## 2020-02-22 ENCOUNTER — Emergency Department (HOSPITAL_COMMUNITY): Payer: Medicaid Other

## 2020-02-22 DIAGNOSIS — R1084 Generalized abdominal pain: Secondary | ICD-10-CM | POA: Diagnosis not present

## 2020-02-22 DIAGNOSIS — R8271 Bacteriuria: Secondary | ICD-10-CM | POA: Diagnosis present

## 2020-02-22 DIAGNOSIS — E873 Alkalosis: Secondary | ICD-10-CM | POA: Diagnosis present

## 2020-02-22 DIAGNOSIS — F1290 Cannabis use, unspecified, uncomplicated: Secondary | ICD-10-CM | POA: Diagnosis present

## 2020-02-22 DIAGNOSIS — I110 Hypertensive heart disease with heart failure: Secondary | ICD-10-CM | POA: Diagnosis present

## 2020-02-22 DIAGNOSIS — F449 Dissociative and conversion disorder, unspecified: Secondary | ICD-10-CM | POA: Diagnosis present

## 2020-02-22 DIAGNOSIS — F419 Anxiety disorder, unspecified: Secondary | ICD-10-CM | POA: Diagnosis present

## 2020-02-22 DIAGNOSIS — F32A Depression, unspecified: Secondary | ICD-10-CM | POA: Diagnosis present

## 2020-02-22 DIAGNOSIS — K3184 Gastroparesis: Secondary | ICD-10-CM

## 2020-02-22 DIAGNOSIS — E1143 Type 2 diabetes mellitus with diabetic autonomic (poly)neuropathy: Secondary | ICD-10-CM

## 2020-02-22 DIAGNOSIS — R718 Other abnormality of red blood cells: Secondary | ICD-10-CM | POA: Diagnosis present

## 2020-02-22 DIAGNOSIS — R000 Tachycardia, unspecified: Secondary | ICD-10-CM | POA: Diagnosis present

## 2020-02-22 DIAGNOSIS — E1065 Type 1 diabetes mellitus with hyperglycemia: Secondary | ICD-10-CM | POA: Diagnosis present

## 2020-02-22 DIAGNOSIS — K5909 Other constipation: Secondary | ICD-10-CM

## 2020-02-22 DIAGNOSIS — R739 Hyperglycemia, unspecified: Secondary | ICD-10-CM | POA: Diagnosis not present

## 2020-02-22 DIAGNOSIS — Z6834 Body mass index (BMI) 34.0-34.9, adult: Secondary | ICD-10-CM | POA: Diagnosis not present

## 2020-02-22 DIAGNOSIS — R112 Nausea with vomiting, unspecified: Secondary | ICD-10-CM | POA: Diagnosis present

## 2020-02-22 DIAGNOSIS — E10649 Type 1 diabetes mellitus with hypoglycemia without coma: Secondary | ICD-10-CM | POA: Diagnosis not present

## 2020-02-22 DIAGNOSIS — K5900 Constipation, unspecified: Secondary | ICD-10-CM | POA: Diagnosis not present

## 2020-02-22 DIAGNOSIS — E876 Hypokalemia: Secondary | ICD-10-CM | POA: Diagnosis present

## 2020-02-22 DIAGNOSIS — M545 Low back pain, unspecified: Secondary | ICD-10-CM | POA: Diagnosis present

## 2020-02-22 DIAGNOSIS — R109 Unspecified abdominal pain: Secondary | ICD-10-CM

## 2020-02-22 DIAGNOSIS — K5901 Slow transit constipation: Secondary | ICD-10-CM | POA: Diagnosis present

## 2020-02-22 DIAGNOSIS — E1043 Type 1 diabetes mellitus with diabetic autonomic (poly)neuropathy: Secondary | ICD-10-CM | POA: Diagnosis present

## 2020-02-22 DIAGNOSIS — E669 Obesity, unspecified: Secondary | ICD-10-CM | POA: Diagnosis present

## 2020-02-22 DIAGNOSIS — I509 Heart failure, unspecified: Secondary | ICD-10-CM | POA: Diagnosis present

## 2020-02-22 DIAGNOSIS — E785 Hyperlipidemia, unspecified: Secondary | ICD-10-CM | POA: Diagnosis present

## 2020-02-22 DIAGNOSIS — G40909 Epilepsy, unspecified, not intractable, without status epilepticus: Secondary | ICD-10-CM | POA: Diagnosis present

## 2020-02-22 DIAGNOSIS — K2289 Other specified disease of esophagus: Secondary | ICD-10-CM | POA: Diagnosis not present

## 2020-02-22 DIAGNOSIS — R8290 Unspecified abnormal findings in urine: Secondary | ICD-10-CM | POA: Diagnosis present

## 2020-02-22 DIAGNOSIS — Z20822 Contact with and (suspected) exposure to covid-19: Secondary | ICD-10-CM | POA: Diagnosis present

## 2020-02-22 DIAGNOSIS — G8929 Other chronic pain: Secondary | ICD-10-CM | POA: Diagnosis present

## 2020-02-22 LAB — CBC
HCT: 36.8 % (ref 36.0–46.0)
Hemoglobin: 11.5 g/dL — ABNORMAL LOW (ref 12.0–15.0)
MCH: 24.9 pg — ABNORMAL LOW (ref 26.0–34.0)
MCHC: 31.3 g/dL (ref 30.0–36.0)
MCV: 79.7 fL — ABNORMAL LOW (ref 80.0–100.0)
Platelets: 302 10*3/uL (ref 150–400)
RBC: 4.62 MIL/uL (ref 3.87–5.11)
RDW: 15.8 % — ABNORMAL HIGH (ref 11.5–15.5)
WBC: 10.3 10*3/uL (ref 4.0–10.5)
nRBC: 0 % (ref 0.0–0.2)

## 2020-02-22 LAB — BASIC METABOLIC PANEL
Anion gap: 11 (ref 5–15)
BUN: 8 mg/dL (ref 6–20)
CO2: 26 mmol/L (ref 22–32)
Calcium: 8.5 mg/dL — ABNORMAL LOW (ref 8.9–10.3)
Chloride: 99 mmol/L (ref 98–111)
Creatinine, Ser: 0.53 mg/dL (ref 0.44–1.00)
GFR, Estimated: >60 mL/min (ref >60)
Glucose, Bld: 201 mg/dL — ABNORMAL HIGH (ref 70–99)
Potassium: 3.1 mmol/L — ABNORMAL LOW (ref 3.5–5.1)
Sodium: 136 mmol/L (ref 135–145)

## 2020-02-22 LAB — CBG MONITORING, ED
Glucose-Capillary: 276 mg/dL — ABNORMAL HIGH (ref 70–99)
Glucose-Capillary: 354 mg/dL — ABNORMAL HIGH (ref 70–99)

## 2020-02-22 LAB — URINALYSIS, ROUTINE W REFLEX MICROSCOPIC
Bilirubin Urine: NEGATIVE
Glucose, UA: >=500 mg/dL — AB
Ketones, ur: 80 mg/dL — AB
Nitrite: NEGATIVE
Protein, ur: NEGATIVE mg/dL
Specific Gravity, Urine: 1.032 — ABNORMAL HIGH (ref 1.005–1.030)
WBC, UA: >50 WBC/hpf — ABNORMAL HIGH (ref 0–5)
pH: 7 (ref 5.0–8.0)

## 2020-02-22 LAB — GLUCOSE, CAPILLARY
Glucose-Capillary: 129 mg/dL — ABNORMAL HIGH (ref 70–99)
Glucose-Capillary: 144 mg/dL — ABNORMAL HIGH (ref 70–99)

## 2020-02-22 LAB — SARS CORONAVIRUS 2 (TAT 6-24 HRS): SARS Coronavirus 2: NEGATIVE

## 2020-02-22 LAB — PREGNANCY, URINE: Preg Test, Ur: NEGATIVE

## 2020-02-22 MED ORDER — PANTOPRAZOLE SODIUM 40 MG IV SOLR
40.0000 mg | Freq: Every day | INTRAVENOUS | Status: DC
  Administered 2020-02-22 – 2020-02-23 (×2): 40 mg via INTRAVENOUS
  Filled 2020-02-22 (×2): qty 40

## 2020-02-22 MED ORDER — MORPHINE SULFATE (PF) 2 MG/ML IV SOLN
2.0000 mg | Freq: Once | INTRAVENOUS | Status: AC
  Administered 2020-02-22: 2 mg via INTRAVENOUS
  Filled 2020-02-22: qty 1

## 2020-02-22 MED ORDER — PROPRANOLOL HCL 20 MG PO TABS
20.0000 mg | ORAL_TABLET | Freq: Two times a day (BID) | ORAL | Status: DC
  Administered 2020-02-22 – 2020-02-29 (×15): 20 mg via ORAL
  Filled 2020-02-22 (×16): qty 1

## 2020-02-22 MED ORDER — ACETAMINOPHEN 325 MG PO TABS
650.0000 mg | ORAL_TABLET | Freq: Four times a day (QID) | ORAL | Status: DC | PRN
  Administered 2020-02-23 – 2020-02-27 (×4): 650 mg via ORAL
  Filled 2020-02-22 (×4): qty 2

## 2020-02-22 MED ORDER — INSULIN GLARGINE 100 UNIT/ML ~~LOC~~ SOLN
45.0000 [IU] | Freq: Every day | SUBCUTANEOUS | Status: DC
  Administered 2020-02-22 – 2020-02-26 (×5): 45 [IU] via SUBCUTANEOUS
  Filled 2020-02-22 (×5): qty 0.45

## 2020-02-22 MED ORDER — SENNOSIDES-DOCUSATE SODIUM 8.6-50 MG PO TABS
2.0000 | ORAL_TABLET | Freq: Every day | ORAL | Status: DC
  Administered 2020-02-22 – 2020-02-28 (×7): 2 via ORAL
  Filled 2020-02-22 (×7): qty 2

## 2020-02-22 MED ORDER — SODIUM CHLORIDE 0.9 % IV SOLN: INTRAVENOUS | Status: AC

## 2020-02-22 MED ORDER — BISACODYL 10 MG RE SUPP
10.0000 mg | Freq: Every day | RECTAL | Status: AC
  Administered 2020-02-22: 10 mg via RECTAL
  Filled 2020-02-22: qty 1

## 2020-02-22 MED ORDER — TOPIRAMATE 25 MG PO TABS
25.0000 mg | ORAL_TABLET | Freq: Two times a day (BID) | ORAL | Status: DC
  Administered 2020-02-22 – 2020-02-29 (×15): 25 mg via ORAL
  Filled 2020-02-22 (×15): qty 1

## 2020-02-22 MED ORDER — MIRTAZAPINE 15 MG PO TABS
7.5000 mg | ORAL_TABLET | Freq: Every day | ORAL | Status: DC
  Administered 2020-02-22 – 2020-02-28 (×7): 7.5 mg via ORAL
  Filled 2020-02-22 (×8): qty 1

## 2020-02-22 MED ORDER — METOCLOPRAMIDE HCL 5 MG/ML IJ SOLN
5.0000 mg | Freq: Three times a day (TID) | INTRAMUSCULAR | Status: DC
  Administered 2020-02-22 – 2020-02-29 (×20): 5 mg via INTRAVENOUS
  Filled 2020-02-22 (×21): qty 2

## 2020-02-22 MED ORDER — POLYETHYLENE GLYCOL 3350 17 G PO PACK
17.0000 g | PACK | Freq: Three times a day (TID) | ORAL | Status: AC
  Administered 2020-02-22 (×2): 17 g via ORAL
  Filled 2020-02-22 (×2): qty 1

## 2020-02-22 MED ORDER — QUETIAPINE FUMARATE 300 MG PO TABS
300.0000 mg | ORAL_TABLET | Freq: Every day | ORAL | Status: DC
Start: 2020-02-22 — End: 2020-02-29
  Administered 2020-02-22 – 2020-02-28 (×7): 300 mg via ORAL
  Filled 2020-02-22 (×4): qty 1
  Filled 2020-02-22 (×5): qty 6

## 2020-02-22 MED ORDER — LUBIPROSTONE 24 MCG PO CAPS
24.0000 ug | ORAL_CAPSULE | Freq: Two times a day (BID) | ORAL | Status: DC
  Administered 2020-02-23 – 2020-02-28 (×12): 24 ug via ORAL
  Filled 2020-02-22 (×14): qty 1

## 2020-02-22 MED ORDER — LIDOCAINE VISCOUS HCL 2 % MT SOLN
15.0000 mL | Freq: Once | OROMUCOSAL | Status: AC
  Administered 2020-02-22: 15 mL via ORAL
  Filled 2020-02-22: qty 15

## 2020-02-22 MED ORDER — ARIPIPRAZOLE 10 MG PO TABS
10.0000 mg | ORAL_TABLET | Freq: Every day | ORAL | Status: DC
  Administered 2020-02-22 – 2020-02-29 (×8): 10 mg via ORAL
  Filled 2020-02-22 (×8): qty 1

## 2020-02-22 MED ORDER — INSULIN GLARGINE 100 UNITS/ML SOLOSTAR PEN: 45.0000 [IU] | PEN_INJECTOR | Freq: Every day | SUBCUTANEOUS | Status: DC

## 2020-02-22 MED ORDER — HALOPERIDOL LACTATE 5 MG/ML IJ SOLN
2.0000 mg | Freq: Once | INTRAMUSCULAR | Status: AC
  Administered 2020-02-22: 2 mg via INTRAVENOUS
  Filled 2020-02-22: qty 1

## 2020-02-22 MED ORDER — CAPSAICIN 0.025 % EX CREA
TOPICAL_CREAM | Freq: Once | CUTANEOUS | Status: AC
  Filled 2020-02-22: qty 60

## 2020-02-22 MED ORDER — BUSPIRONE HCL 10 MG PO TABS
10.0000 mg | ORAL_TABLET | Freq: Two times a day (BID) | ORAL | Status: DC
  Administered 2020-02-22 – 2020-02-29 (×15): 10 mg via ORAL
  Filled 2020-02-22 (×15): qty 1

## 2020-02-22 MED ORDER — QUETIAPINE FUMARATE 25 MG PO TABS
25.0000 mg | ORAL_TABLET | Freq: Every morning | ORAL | Status: DC
  Administered 2020-02-22 – 2020-02-29 (×7): 25 mg via ORAL
  Filled 2020-02-22 (×8): qty 1

## 2020-02-22 MED ORDER — ATORVASTATIN CALCIUM 10 MG PO TABS
20.0000 mg | ORAL_TABLET | Freq: Every day | ORAL | Status: DC
  Administered 2020-02-22 – 2020-02-29 (×8): 20 mg via ORAL
  Filled 2020-02-22 (×8): qty 2

## 2020-02-22 MED ORDER — ALUM & MAG HYDROXIDE-SIMETH 200-200-20 MG/5ML PO SUSP
30.0000 mL | Freq: Once | ORAL | Status: AC
  Administered 2020-02-22: 30 mL via ORAL
  Filled 2020-02-22: qty 30

## 2020-02-22 MED ORDER — ACETAMINOPHEN 650 MG RE SUPP: 650.0000 mg | Freq: Four times a day (QID) | RECTAL | Status: DC | PRN

## 2020-02-22 MED ORDER — LAMOTRIGINE 25 MG PO TABS
50.0000 mg | ORAL_TABLET | Freq: Two times a day (BID) | ORAL | Status: DC
  Administered 2020-02-22 – 2020-02-29 (×15): 50 mg via ORAL
  Filled 2020-02-22 (×16): qty 2

## 2020-02-22 MED ORDER — VITAMIN B-12 1000 MCG PO TABS
1000.0000 ug | ORAL_TABLET | Freq: Every day | ORAL | Status: DC
  Administered 2020-02-22 – 2020-02-29 (×8): 1000 ug via ORAL
  Filled 2020-02-22 (×8): qty 1

## 2020-02-22 MED ORDER — ENOXAPARIN SODIUM 40 MG/0.4ML ~~LOC~~ SOLN
40.0000 mg | Freq: Every day | SUBCUTANEOUS | Status: DC
  Administered 2020-02-22 – 2020-02-28 (×7): 40 mg via SUBCUTANEOUS
  Filled 2020-02-22 (×8): qty 0.4

## 2020-02-22 MED ORDER — INSULIN ASPART 100 UNIT/ML ~~LOC~~ SOLN
0.0000 [IU] | Freq: Three times a day (TID) | SUBCUTANEOUS | Status: DC
  Administered 2020-02-22: 9 [IU] via SUBCUTANEOUS
  Administered 2020-02-22: 1 [IU] via SUBCUTANEOUS
  Administered 2020-02-23: 2 [IU] via SUBCUTANEOUS
  Administered 2020-02-23: 3 [IU] via SUBCUTANEOUS
  Administered 2020-02-23: 7 [IU] via SUBCUTANEOUS
  Administered 2020-02-24 (×2): 2 [IU] via SUBCUTANEOUS
  Administered 2020-02-24: 7 [IU] via SUBCUTANEOUS
  Administered 2020-02-25: 2 [IU] via SUBCUTANEOUS
  Administered 2020-02-25 (×2): 1 [IU] via SUBCUTANEOUS

## 2020-02-22 MED ORDER — ONDANSETRON HCL 4 MG/2ML IJ SOLN
4.0000 mg | Freq: Four times a day (QID) | INTRAMUSCULAR | Status: DC | PRN
  Administered 2020-02-23: 4 mg via INTRAVENOUS
  Filled 2020-02-22: qty 2

## 2020-02-22 NOTE — ED Notes (Signed)
RN attempted report 

## 2020-02-22 NOTE — H&P (Signed)
History and Physical    Nancy [name] RCB:[id number] DOB: December 29, 2000 DOA: 02/21/2020  PCP: Fleet Contras, MD Patient coming from: Home  Chief Complaint: Abdominal pain, nausea, vomiting  HPI: Nancy [name] is a 20 y.o. 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  Patient states this is a chronic problem for her and started again about 1.5 weeks ago.  States she was seen in the emergency room 3 times recently and sent home.  Each time after she went home her symptoms started again and she started having multiple seizures.  She has been drinking a lot of sweet beverages such as Gatorade and sweet tea to keep her self hydrated.  She normally takes Lantus 70 units at bedtime and NovoLog 15 units twice a day with meals as she only eats 2 big meals in a day.  Since she has been vomiting, she has not taken Lantus for the past few days but has continued to take her mealtime insulin as she is consuming sweet beverages.  Reports chronic constipation and states she has not had a bowel movement since January but still passing gas.  She does vape marijuana but has not used it for the past 2 weeks.  Her abdominal pain is generalized.  States every time she comes into the hospital she is told her symptoms are due to diabetic gastroparesis but her gastroenterologist did gastric emptying studies multiple times in the past which were normal.  Patient is fully vaccinated against COVID including booster shot.  She has also had intermittent episodes of chest pain along with abdominal pain for the past 1.5 weeks.  ED Course: Afebrile.  Tachycardic.  WBC 11.1, hemoglobin 13.7, platelet  count 403K.  Sodium 137, potassium 3.7, chloride 98, bicarb 25, anion gap 14, BUN 7, creatinine 0.7, glucose 327.  Lipase and LFTs normal.  Beta hydroxybutyric acid 2.6.  UA with glucosuria, ketones, negative nitrite, small amount of leukocytes, greater than 50 WBCs, and few bacteria.  Magnesium 1.9.  High-sensitivity troponin negative.  Urine pregnancy test negative. VBG with pH 7.69, PCO2 21 and alkalosis was felt to be due to patient hyperventilating in the ED and GI based acid loss.  SARS-CoV-2 PCR test pending.  KUB showing normal bowel gas pattern. Patient was given GI cocktail, Benadryl, Haldol, NovoLog 7 units, IV Reglan, morphine, Pepcid, and 1 L LR bolus.  Review of Systems:  All systems reviewed and apart from history of presenting illness, are negative.  Past Medical History:  Diagnosis Date  . Acanthosis nigricans   . Anxiety   . CHF (congestive heart failure) (HCC)   . Chronic lower back pain   . Depression   . DKA, type 1 (HCC) 09/13/2018  . Dyspepsia   . Obesity   . Ovarian cyst    pt is not aware of this hx (11/24/2017)  . 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 Premature baby   . Seizures (HCC)   . Type II diabetes mellitus (HCC)    insulin dependant    Past Surgical History:  Procedure Laterality Date  . ABDOMINAL HERNIA REPAIR     "I was a baby"  . BIOPSY  10/12/2018   Procedure: BIOPSY;  Surgeon: Lynann Bologna, MD;  Location: [facility] ENDOSCOPY;  Service: Endoscopy;;  .  ESOPHAGOGASTRODUODENOSCOPY (EGD) WITH PROPOFOL N/A 10/12/2018   Procedure: ESOPHAGOGASTRODUODENOSCOPY (EGD) WITH PROPOFOL;  Surgeon: Lynann Bologna, MD;  Location: [facility] ENDOSCOPY;  Service: Endoscopy;  Laterality: N/A;  . HERNIA REPAIR    . LEFT HEART CATH AND CORONARY ANGIOGRAPHY N/A 10/13/2018   Procedure: LEFT HEART CATH AND CORONARY ANGIOGRAPHY;  Surgeon: Kathleene Hazel, MD;  Location: [facility] INVASIVE CV LAB;  Service: Cardiovascular;  Laterality: N/A;  . TONSILLECTOMY AND ADENOIDECTOMY    . WISDOM TOOTH  EXTRACTION  2017     reports that she has never smoked. She has never used smokeless tobacco. She reports that she does not drink alcohol and does not use drugs.  Allergies  Allergen Reactions  . Tomato Anaphylaxis  . Ibuprofen Other (See Comments)    GI MD said to not take this anymore  . Oatmeal Rash    Family History  Problem Relation Age of Onset  . Diabetes Mother   . Hypertension Mother   . Obesity Mother   . Asthma Mother   . Allergic rhinitis Mother   . Eczema Mother   . Cervical cancer Mother   . Diabetes Father   . Hypertension Father   . Obesity Father   . Hyperlipidemia Father   . Hypertension Paternal Aunt   . Hypertension Maternal Grandfather   . Colon cancer Maternal Grandfather   . Diabetes Paternal Grandmother   . Obesity Paternal Grandmother   . Diabetes Paternal Grandfather   . Obesity Paternal Grandfather   . Angioedema Neg Hx   . Immunodeficiency Neg Hx   . Urticaria Neg Hx   . Stomach cancer Neg Hx   . Esophageal cancer Neg Hx     Prior to Admission medications   Medication Sig Start Date End Date Taking? Authorizing Provider  amoxicillin (AMOXIL) 500 MG tablet Take 500 mg by mouth See admin instructions. 02/14/20   [provider]  ARIPiprazole (ABILIFY) 10 MG tablet Take 10 mg by mouth in the morning and at bedtime.     [provider]  atorvastatin (LIPITOR) 20 MG tablet Take 1 tablet (20 mg total) by mouth daily at 6 PM. Patient taking differently: Take 20 mg by mouth daily. 10/05/18   Storm Frisk, MD  busPIRone (BUSPAR) 10 MG tablet Take 10 mg by mouth 2 (two) times daily.  11/19/18   [provider]  Cyanocobalamin (VITAMIN B-12 PO) Take 1 tablet by mouth daily.    [provider]  diclofenac Sodium (VOLTAREN) 1 % GEL Apply 1 application topically 2 (two) times daily as needed (chest pains).    [provider]  dicyclomine (BENTYL) 10 MG capsule Take 1 capsule (10 mg total) by mouth 4 (four)  times daily as needed for spasms. Patient not taking: Reported on 02/19/2020 11/21/19   Linwood Dibbles, MD  insulin aspart (NOVOLOG FLEXPEN) 100 UNIT/ML FlexPen Inject 15 Units into the skin 2 (two) times daily before a meal.    [provider]  insulin glargine (LANTUS) 100 unit/mL SOPN Inject 70 Units into the skin at bedtime.    [provider]  lamoTRIgine (LAMICTAL) 25 MG tablet Take 2 tablets (50 mg total) by mouth 2 (two) times daily. For mood stabilization 11/11/18   Nwoko, Nicole Kindred I, NP  lidocaine (LIDODERM) 5 % Place 1 patch onto the skin daily as needed (pain). Remove & Discard patch within 12 hours or as directed by MD    [provider]  lubiprostone (AMITIZA) 24 MCG capsule Take 1  capsule (24 mcg total) by mouth 2 (two) times daily with a meal. Patient taking differently: Take 24 mcg by mouth daily with breakfast. 10/27/18   Esterwood, Amy S, PA-C  metoCLOPramide (REGLAN) 5 MG tablet Take 1 tablet (5 mg total) by mouth 3 (three) times daily. 10/03/19   Jerald Kief, MD  mirtazapine (REMERON) 7.5 MG tablet Take 1 tablet (7.5 mg total) by mouth at bedtime. For depression/sleep 11/11/18   Armandina Stammer I, NP  Multiple Vitamin (MULTIVITAMIN WITH MINERALS) TABS tablet Take 1 tablet by mouth daily. 09/18/18   Clapacs, Jackquline Denmark, MD  pantoprazole (PROTONIX) 40 MG tablet Take 1 tablet (40 mg total) by mouth 2 (two) times daily. For acid reflux 01/29/19   Jae Dire, MD  polyethylene glycol (MIRALAX / GLYCOLAX) 17 g packet Take 17 g by mouth 2 (two) times daily. 01/16/20   Swayze, Ava, DO  prochlorperazine (COMPAZINE) 25 MG suppository Place 1 suppository (25 mg total) rectally every 12 (twelve) hours as needed for nausea or vomiting. 12/05/18   Melene Plan, DO  propranolol (INDERAL) 20 MG tablet Take 1 tablet (20 mg total) by mouth 2 (two) times daily. For anxiety/HTN 12/23/18   Swayze, Ava, DO  QUEtiapine (SEROQUEL) 100 MG tablet Take 300 mg by mouth at bedtime. 07/05/19    [provider]  QUEtiapine (SEROQUEL) 25 MG tablet Take 25 mg by mouth every morning.    [provider]  senna-docusate (SENOKOT-S) 8.6-50 MG tablet Take 1 tablet by mouth 2 (two) times daily. 01/16/20   Swayze, Ava, DO  sucralfate (CARAFATE) 1 GM/10ML suspension Take 1 g by mouth at bedtime as needed (stomach pain). 07/05/19   [provider]  topiramate (TOPAMAX) 25 MG tablet Take 1 tablet (25 mg total) by mouth 2 (two) times daily. For seizure activities 11/11/18   Armandina Stammer I, NP  ULTRAM 50 MG tablet Take 50 mg by mouth every 6 (six) hours as needed for moderate pain. 02/14/20   [provider]    Physical Exam: Vitals:   02/21/20 1947 02/21/20 2030 02/21/20 2045 02/21/20 2346  BP: (!) 147/101 (!) 154/144 (!) 118/104 (!) 133/113  Pulse: (!) 120 (!) 124  (!) 110  Resp: 16 (!) 21 (!) 32 13  Temp: 98.8 F (37.1 C)     TempSrc: Oral     SpO2: 100% 100%  99%    Physical Exam Constitutional:      General: She is not in acute distress. HENT:     Head: Normocephalic and atraumatic.  Eyes:     Extraocular Movements: Extraocular movements intact.     Conjunctiva/sclera: Conjunctivae normal.  Cardiovascular:     Rate and Rhythm: Regular rhythm. Tachycardia present.     Pulses: Normal pulses.     Comments: Slightly tachycardic Pulmonary:     Effort: Pulmonary effort is normal. No respiratory distress.     Breath sounds: Normal breath sounds. No wheezing or rales.  Abdominal:     General: Bowel sounds are normal. There is no distension.     Palpations: Abdomen is soft.     Tenderness: There is abdominal tenderness. There is no guarding or rebound.     Comments: Generalized tenderness to palpation  Musculoskeletal:        General: No swelling or tenderness.     Cervical back: Normal range of motion and neck supple.  Skin:    General: Skin is warm and dry.  Neurological:     General: No  focal deficit present.     Mental Status: She is alert and  oriented to person, place, and time.     Labs on Admission: I have personally reviewed following labs and imaging studies  CBC: Recent Labs  Lab 02/15/20 1218 02/19/20 0750 02/19/20 0755 02/19/20 0820 02/21/20 1944 02/21/20 2122  WBC 14.6* 9.8  --   --  11.1*  --   NEUTROABS 11.1* 7.9*  --   --   --   --   HGB 13.4 12.9 13.9 14.3 13.7 14.3  HCT 43.0 40.9 41.0 42.0 43.0 42.0  MCV 77.2* 77.2*  --   --  77.5*  --   PLT 497* 374  --   --  403*  --    Basic Metabolic Panel: Recent Labs  Lab 02/15/20 0705 02/15/20 0829 02/15/20 1210 02/19/20 0750 02/19/20 0755 02/19/20 0820 02/19/20 1346 02/21/20 1944 02/21/20 2110 02/21/20 2122  NA 133*   < > 136 133* 135 134* 137 137  --  136  K 5.0   < > 5.3* 3.4* 3.4* 3.5 3.8 3.7  --  3.6  CL 92*  --  98 97* 99  --  104 98  --   --   CO2 19*  --  23 21*  --   --  25 25  --   --   GLUCOSE 477*  --  309* 446* 449*  --  206* 327*  --   --   BUN 13  --  13 6 6   --  <5* 7  --   --   CREATININE 0.78  --  0.67 0.66 0.40*  --  0.54 0.71  --   --   CALCIUM 9.7  --  8.7* 9.5  --   --  9.0 9.7  --   --   MG  --   --   --   --   --   --   --   --  1.9  --    < > = values in this interval not displayed.   GFR: Estimated Creatinine Clearance: 116.6 mL/min (by C-G formula based on SCr of 0.71 mg/dL). Liver Function Tests: Recent Labs  Lab 02/21/20 1944  AST 23  ALT 15  ALKPHOS 86  BILITOT 1.2  PROT 7.0  ALBUMIN 4.1   Recent Labs  Lab 02/21/20 1944  LIPASE 30   No results for input(s): AMMONIA in the last 168 hours. Coagulation Profile: No results for input(s): INR, PROTIME in the last 168 hours. Cardiac Enzymes: No results for input(s): CKTOTAL, CKMB, CKMBINDEX, TROPONINI in the last 168 hours. BNP (last 3 results) No results for input(s): PROBNP in the last 8760 hours. HbA1C: No results for input(s): HGBA1C in the last 72 hours. CBG: Recent Labs  Lab 02/19/20 1023 02/19/20 1058 02/19/20 1225 02/19/20 1426 02/22/20 0036   GLUCAP 399* 314* 188* 233* 276*   Lipid Profile: No results for input(s): CHOL, HDL, LDLCALC, TRIG, CHOLHDL, LDLDIRECT in the last 72 hours. Thyroid Function Tests: No results for input(s): TSH, T4TOTAL, FREET4, T3FREE, THYROIDAB in the last 72 hours. Anemia Panel: No results for input(s): VITAMINB12, FOLATE, FERRITIN, TIBC, IRON, RETICCTPCT in the last 72 hours. Urine analysis:    Component Value Date/Time   COLORURINE YELLOW 02/21/2020 2348   APPEARANCEUR HAZY (A) 02/21/2020 2348   LABSPEC 1.032 (H) 02/21/2020 2348   PHURINE 7.0 02/21/2020 2348   GLUCOSEU >=500 (A) 02/21/2020 2348   HGBUR SMALL (A) 02/21/2020 2348  BILIRUBINUR NEGATIVE 02/21/2020 2348   BILIRUBINUR negative 07/13/2018 1651   KETONESUR 80 (A) 02/21/2020 2348   PROTEINUR NEGATIVE 02/21/2020 2348   UROBILINOGEN 0.2 07/13/2018 1651   UROBILINOGEN 1.0 07/09/2017 1324   NITRITE NEGATIVE 02/21/2020 2348   LEUKOCYTESUR SMALL (A) 02/21/2020 2348    Radiological Exams on Admission: DG Abd 1 View  Result Date: 02/22/2020 CLINICAL DATA:  Abdominal pain EXAM: ABDOMEN - 1 VIEW COMPARISON:  01/14/2020 FINDINGS: The bowel gas pattern is normal. No radio-opaque calculi or other significant radiographic abnormality are seen. IMPRESSION: Negative. Electronically Signed   By: Deatra Robinson M.D.   On: 02/22/2020 00:44   DG Chest Portable 1 View  Result Date: 02/21/2020 CLINICAL DATA:  Vomiting. EXAM: PORTABLE CHEST 1 VIEW COMPARISON:  February 19, 2020 FINDINGS: The heart size and mediastinal contours are within normal limits. Both lungs are clear. The visualized skeletal structures are unremarkable. IMPRESSION: No active disease. Electronically Signed   By: Aram Candela M.D.   On: 02/21/2020 21:12    EKG: Independently reviewed.  Sinus tachycardia, rate increased since prior tracing.  Assessment/Plan Principal Problem:   Intractable nausea and vomiting Active Problems:   Chest pain   Hyperglycemia   Abdominal  pain   Chronic constipation   Intractable nausea and vomiting, generalized abdominal pain: This problem is acute on chronic.  Lipase and LFTs normal.  Urine pregnancy test negative.  KUB showing normal bowel gas pattern.  Differential includes diabetic gastroparesis and cannabinoid hyperemesis syndrome.  Urine drug screens in the past have been consistently positive for THC.  Gastric emptying study done in March 2021 was normal but per prior GI note there is still suspicion that she has impaired gastric emptying related to her uncontrolled diabetes.  EGD done in October 2020 was showing mild Candida esophagitis which was treated with fluconazole and was also showing mild gastritis for which patient was placed on Protonix 40 mg daily. -IV fluid hydration.  Scheduled IV Reglan 5 mg every 8 hours.  IV Protonix 40 mg daily.  Zofran as needed.  Avoid opiates.  UDS ordered.  Patient is currently hungry and requesting food to eat.  Will start with clear liquid diet, advance as tolerated.  Chronic constipation -Medications to stimulate bowel movement can be given after her nausea and vomiting are under control.  Insulin-dependent diabetes with hyperglycemia: A1c 11.7 on 11/25/2019.  Blood glucose elevated at 327 on labs without acidosis or elevated anion gap.  Beta hydroxybutyric acid slightly elevated at 2.6.  Ketones on UA could be due to starvation ketoacidosis in setting of intractable nausea and vomiting.  No acidosis on VBG. Patient was given NovoLog 7 units and 1 L LR bolus in the ED.   -Repeat A1c.  Resume home basal insulin at 1/2 of the usual dose (Lantus 45 units daily).  Order sliding scale insulin sensitive every 4 hours.  Hold scheduled mealtime insulin until her p.o. intake improves.  Abnormal urinalysis: UA with negative nitrite, small amount of leukocytes, greater than 50 WBCs, and few bacteria.  Discussed with the patient.  She is not endorsing any UTI symptoms such as dysuria, urinary  frequency/urgency.  Afebrile and no significant leukocytosis on labs. -Will hold off antibiotics at this time.  Urine culture ordered.  Chest pain: Atypical, ongoing for the past 1.5 weeks in association with abdominal pain.  ACS less likely as EKG without acute ischemic changes and high-sensitivity troponin negative.  Currently chest pain-free and appears comfortable. -Continue to monitor  History  of seizure disorder and pseudoseizures: Patient reports having multiple seizures at home.  No seizure-like activity since she has been in the ED. -Resume home antiepileptics after pharmacy med rec is done.  Consult neurology if there is concern for breakthrough seizures during this hospitalization.  Anxiety, depression, mood disorder -Resume home meds after pharmacy med rec is done  Pharmacy med rec pending.  DVT prophylaxis: Lovenox Code Status: Full code Family Communication: No family available at this time. Disposition Plan: Status is: Observation  The patient remains OBS appropriate and will d/c before 2 midnights.  Dispo: The patient is from: Home              Anticipated d/c is to: Home              Anticipated d/c date is: 2 days              Patient currently is not medically stable to d/c.   Difficult to place patient No   Level of care: Level of care: Telemetry Medical   The medical decision making on this patient was of high complexity and the patient is at high risk for clinical deterioration, therefore this is a level 3 visit.  John Giovanni MD [facility]  If 7PM-7AM, please contact night-coverage [email or web address]  02/22/2020, 4:27 AM

## 2020-02-22 NOTE — ED Notes (Signed)
Tele Breakfast order placed 

## 2020-02-22 NOTE — Progress Notes (Signed)
PROGRESS NOTE        PATIENT DETAILS Name: Nancy [name] Age: 20 y.o. Sex: female Date of Birth: 10/03/2000 Admit Date: 02/21/2020 Admitting Physician John Giovanni, MD OXB:[id number], Dorma Russell, MD  Brief Narrative: Patient is a 19 y.o. female DM-2, HTN, seizure disorder, gastroparesis, cyclic vomiting, marijuana use-multiple ER visits-presenting with abdominal pain/vomiting. See below for further details.  Significant events: 2/15>> presented to ED with abdominal pain/vomiting-admitted for further eval/treatment.  Significant studies: 2/15>> chest x-ray: No active disease 2/16>> x-ray abdomen: No SBO   Antimicrobial therapy: None  Microbiology data: None  Procedures : None  Consults: None  DVT Prophylaxis : enoxaparin (LOVENOX) injection 40 mg Start: 02/22/20 1000   Subjective: No vomiting since last night-abdominal pain stable. Claims has not had a bowel movement in 2 weeks!  Assessment/Plan: Nausea/vomiting/abdominal pain: Chronic issue-benign abdominal exam-x-ray abdomen without SBO. Continue clear liquids-slowly advance diet-remains on IV PPI/Reglan and as needed antiemetics.  Constipation: Claims no BM x2 weeks-have placed on MiraLAX/senna-1 dose of Dulcolax suppository.  Asymptomatic bacteriuria: No role for antibiotics  HLD: Stable-continue statin  HTN: Continue propanolol  History of seizures/pseudoseizures: Stable-have resumed Lamictal and Topamax  History of anxiety/depression: Stable-continue Seroquel, Remeron and BuSpar.  DM-2 (A1c 11.7 on 11/25/2019): CBGs relatively stable-continue Lantus 45 units, SSI-as diet advances-we will slowly uptitrate her insulin regimen.  Recent Labs    02/19/20 1225 02/19/20 1426 02/22/20 0036  GLUCAP 188* 233* 276*    Obesity: Estimated body mass index is 34.19 kg/m as calculated from the following:   Height as of 02/19/20: 5\' 3"  (1.6 m).   Weight as of 02/19/20: 87.5 kg.      Diet: Diet Order            Diet clear liquid Room service appropriate? Yes; Fluid consistency: Thin  Diet effective now                  Code Status: Full code   Family Communication: None at bedside  Disposition Plan: Status is: Observation  The patient remains OBS appropriate and will d/c before 2 midnights.  Dispo: The patient is from: Home              Anticipated d/c is to: Home              Anticipated d/c date is: 1 day              Patient currently is not medically stable to d/c.   Difficult to place patient No   Barriers to Discharge: Presented with intractable vomiting/abdominal pain-diet being advanced-constipation being treated  Antimicrobial agents: Anti-infectives (From admission, onward)   None       Time spent: 25 minutes-Greater than 50% of this time was spent in counseling, explanation of diagnosis, planning of further management, and coordination of care.  MEDICATIONS: Scheduled Meds: . ARIPiprazole  10 mg Oral Daily  . atorvastatin  20 mg Oral Daily  . bisacodyl  10 mg Rectal Daily  . busPIRone  10 mg Oral BID  . enoxaparin (LOVENOX) injection  40 mg Subcutaneous Daily  . insulin aspart  0-9 Units Subcutaneous TID WC  . insulin glargine  45 Units Subcutaneous Daily  . lamoTRIgine  50 mg Oral BID  . lubiprostone  24 mcg Oral BID WC  . metoCLOPramide (REGLAN) injection  5 mg Intravenous Q8H  .  mirtazapine  7.5 mg Oral QHS  . pantoprazole (PROTONIX) IV  40 mg Intravenous Daily  . polyethylene glycol  17 g Oral Q8H  . propranolol  20 mg Oral BID  . QUEtiapine  25 mg Oral q morning  . QUEtiapine  300 mg Oral QHS  . senna-docusate  2 tablet Oral QHS  . topiramate  25 mg Oral BID  . vitamin B-12  1,000 mcg Oral Daily   Continuous Infusions: . sodium chloride 125 mL/hr at 02/22/20 0557   PRN Meds:.acetaminophen **OR** acetaminophen, ondansetron (ZOFRAN) IV   PHYSICAL EXAM: Vital signs: Vitals:   02/21/20 2045 02/21/20 2346  02/22/20 0600 02/22/20 1020  BP: (!) 118/104 (!) 133/113 (!) 97/47 110/77  Pulse:  (!) 110 (!) 106 (!) 102  Resp: (!) 32 13 15 18   Temp:   99.2 F (37.3 C)   TempSrc:   Oral   SpO2:  99% 98% 100%   There were no vitals filed for this visit. There is no height or weight on file to calculate BMI.   Gen Exam:Alert awake-not in any distress HEENT:atraumatic, normocephalic Chest: B/L clear to auscultation anteriorly CVS:S1S2 regular Abdomen:soft non tender, non distended Extremities:no edema Neurology: Non focal Skin: no rash  I have personally reviewed following labs and imaging studies  LABORATORY DATA: CBC: Recent Labs  Lab 02/15/20 1218 02/19/20 0750 02/19/20 0755 02/19/20 0820 02/21/20 1944 02/21/20 2122 02/22/20 0600  WBC 14.6* 9.8  --   --  11.1*  --  10.3  NEUTROABS 11.1* 7.9*  --   --   --   --   --   HGB 13.4 12.9 13.9 14.3 13.7 14.3 11.5*  HCT 43.0 40.9 41.0 42.0 43.0 42.0 36.8  MCV 77.2* 77.2*  --   --  77.5*  --  79.7*  PLT 497* 374  --   --  403*  --  302    Basic Metabolic Panel: Recent Labs  Lab 02/15/20 1210 02/19/20 0750 02/19/20 0755 02/19/20 0820 02/19/20 1346 02/21/20 1944 02/21/20 2110 02/21/20 2122 02/22/20 0600  NA 136 133* 135 134* 137 137  --  136 136  K 5.3* 3.4* 3.4* 3.5 3.8 3.7  --  3.6 3.1*  CL 98 97* 99  --  104 98  --   --  99  CO2 23 21*  --   --  25 25  --   --  26  GLUCOSE 309* 446* 449*  --  206* 327*  --   --  201*  BUN 13 6 6   --  <5* 7  --   --  8  CREATININE 0.67 0.66 0.40*  --  0.54 0.71  --   --  0.53  CALCIUM 8.7* 9.5  --   --  9.0 9.7  --   --  8.5*  MG  --   --   --   --   --   --  1.9  --   --     GFR: Estimated Creatinine Clearance: 116.6 mL/min (by C-G formula based on SCr of 0.53 mg/dL).  Liver Function Tests: Recent Labs  Lab 02/21/20 1944  AST 23  ALT 15  ALKPHOS 86  BILITOT 1.2  PROT 7.0  ALBUMIN 4.1   Recent Labs  Lab 02/21/20 1944  LIPASE 30   No results for input(s): AMMONIA in the  last 168 hours.  Coagulation Profile: No results for input(s): INR, PROTIME in the last 168 hours.  Cardiac Enzymes:  No results for input(s): CKTOTAL, CKMB, CKMBINDEX, TROPONINI in the last 168 hours.  BNP (last 3 results) No results for input(s): PROBNP in the last 8760 hours.  Lipid Profile: No results for input(s): CHOL, HDL, LDLCALC, TRIG, CHOLHDL, LDLDIRECT in the last 72 hours.  Thyroid Function Tests: No results for input(s): TSH, T4TOTAL, FREET4, T3FREE, THYROIDAB in the last 72 hours.  Anemia Panel: No results for input(s): VITAMINB12, FOLATE, FERRITIN, TIBC, IRON, RETICCTPCT in the last 72 hours.  Urine analysis:    Component Value Date/Time   COLORURINE YELLOW 02/21/2020 2348   APPEARANCEUR HAZY (A) 02/21/2020 2348   LABSPEC 1.032 (H) 02/21/2020 2348   PHURINE 7.0 02/21/2020 2348   GLUCOSEU >=500 (A) 02/21/2020 2348   HGBUR SMALL (A) 02/21/2020 2348   BILIRUBINUR NEGATIVE 02/21/2020 2348   BILIRUBINUR negative 07/13/2018 1651   KETONESUR 80 (A) 02/21/2020 2348   PROTEINUR NEGATIVE 02/21/2020 2348   UROBILINOGEN 0.2 07/13/2018 1651   UROBILINOGEN 1.0 07/09/2017 1324   NITRITE NEGATIVE 02/21/2020 2348   LEUKOCYTESUR SMALL (A) 02/21/2020 2348    Sepsis Labs: Lactic Acid, Venous    Component Value Date/Time   LATICACIDVEN 2.4 (HH) 08/23/2019 1020    MICROBIOLOGY: No results found for this or any previous visit (from the past 240 hour(s)).  RADIOLOGY STUDIES/RESULTS: DG Abd 1 View  Result Date: 02/22/2020 CLINICAL DATA:  Abdominal pain EXAM: ABDOMEN - 1 VIEW COMPARISON:  01/14/2020 FINDINGS: The bowel gas pattern is normal. No radio-opaque calculi or other significant radiographic abnormality are seen. IMPRESSION: Negative. Electronically Signed   By: Deatra Robinson M.D.   On: 02/22/2020 00:44   DG Chest Portable 1 View  Result Date: 02/21/2020 CLINICAL DATA:  Vomiting. EXAM: PORTABLE CHEST 1 VIEW COMPARISON:  February 19, 2020 FINDINGS: The heart size and  mediastinal contours are within normal limits. Both lungs are clear. The visualized skeletal structures are unremarkable. IMPRESSION: No active disease. Electronically Signed   By: Aram Candela M.D.   On: 02/21/2020 21:12     LOS: 0 days   Jeoffrey Massed, MD  [facility]    To contact the attending provider between 7A-7P or the covering provider during after hours 7P-7A, please log into the web site [email or web address] and access using universal [place] password for that web site. If you do not have the password, please call the hospital operator.  02/22/2020, 10:25 AM

## 2020-02-23 DIAGNOSIS — E1143 Type 2 diabetes mellitus with diabetic autonomic (poly)neuropathy: Secondary | ICD-10-CM | POA: Diagnosis not present

## 2020-02-23 DIAGNOSIS — R112 Nausea with vomiting, unspecified: Secondary | ICD-10-CM | POA: Diagnosis not present

## 2020-02-23 DIAGNOSIS — K3184 Gastroparesis: Secondary | ICD-10-CM | POA: Diagnosis not present

## 2020-02-23 DIAGNOSIS — K5909 Other constipation: Secondary | ICD-10-CM | POA: Diagnosis not present

## 2020-02-23 LAB — GLUCOSE, CAPILLARY
Glucose-Capillary: 194 mg/dL — ABNORMAL HIGH (ref 70–99)
Glucose-Capillary: 309 mg/dL — ABNORMAL HIGH (ref 70–99)
Glucose-Capillary: 201 mg/dL — ABNORMAL HIGH (ref 70–99)
Glucose-Capillary: 187 mg/dL — ABNORMAL HIGH (ref 70–99)

## 2020-02-23 LAB — BASIC METABOLIC PANEL
Anion gap: 10 (ref 5–15)
BUN: 7 mg/dL (ref 6–20)
CO2: 25 mmol/L (ref 22–32)
Calcium: 8.8 mg/dL — ABNORMAL LOW (ref 8.9–10.3)
Chloride: 102 mmol/L (ref 98–111)
Creatinine, Ser: 0.67 mg/dL (ref 0.44–1.00)
GFR, Estimated: >60 mL/min (ref >60)
Glucose, Bld: 236 mg/dL — ABNORMAL HIGH (ref 70–99)
Potassium: 3.3 mmol/L — ABNORMAL LOW (ref 3.5–5.1)
Sodium: 137 mmol/L (ref 135–145)

## 2020-02-23 LAB — MAGNESIUM: Magnesium: 1.9 mg/dL (ref 1.7–2.4)

## 2020-02-23 MED ORDER — MORPHINE SULFATE (PF) 2 MG/ML IV SOLN
1.0000 mg | Freq: Once | INTRAVENOUS | Status: AC
  Administered 2020-02-23: 1 mg via INTRAVENOUS
  Filled 2020-02-23: qty 1

## 2020-02-23 MED ORDER — SORBITOL 70 % SOLN
960.0000 mL | TOPICAL_OIL | Freq: Once | ORAL | Status: AC
  Administered 2020-02-23: 960 mL via RECTAL
  Filled 2020-02-23: qty 473

## 2020-02-23 MED ORDER — PANTOPRAZOLE SODIUM 40 MG PO TBEC
40.0000 mg | DELAYED_RELEASE_TABLET | Freq: Every day | ORAL | Status: DC
  Administered 2020-02-24 – 2020-02-29 (×6): 40 mg via ORAL
  Filled 2020-02-23 (×6): qty 1

## 2020-02-23 MED ORDER — POLYETHYLENE GLYCOL 3350 17 G PO PACK
17.0000 g | PACK | Freq: Every day | ORAL | Status: DC
  Administered 2020-02-24: 17 g via ORAL
  Filled 2020-02-23: qty 1

## 2020-02-23 MED ORDER — PROMETHAZINE HCL 25 MG/ML IJ SOLN
25.0000 mg | Freq: Four times a day (QID) | INTRAMUSCULAR | Status: DC | PRN
  Administered 2020-02-23 – 2020-02-25 (×3): 25 mg via INTRAVENOUS
  Filled 2020-02-23 (×3): qty 1

## 2020-02-23 MED ORDER — ERYTHROMYCIN ETHYLSUCCINATE 200 MG/5ML PO SUSR
200.0000 mg | Freq: Three times a day (TID) | ORAL | Status: DC
  Administered 2020-02-23 – 2020-02-25 (×5): 200 mg via ORAL
  Filled 2020-02-23 (×7): qty 5

## 2020-02-23 MED ORDER — ONDANSETRON HCL 4 MG/2ML IJ SOLN
4.0000 mg | Freq: Three times a day (TID) | INTRAMUSCULAR | Status: DC
  Administered 2020-02-23 – 2020-02-28 (×8): 4 mg via INTRAVENOUS
  Filled 2020-02-23 (×11): qty 2

## 2020-02-23 MED ORDER — LACTATED RINGERS IV SOLN: INTRAVENOUS | Status: DC

## 2020-02-23 NOTE — Progress Notes (Signed)
PROGRESS NOTE        PATIENT DETAILS Name: Nancy [name] Age: 20 y.o. Sex: female Date of Birth: 2000/05/27 Admit Date: 02/21/2020 Admitting Physician Dewayne Shorter Levora Dredge, MD BOF:[id number], Dorma Russell, MD  Brief Narrative: Patient is a 20 y.o. female DM-2, HTN, seizure disorder, gastroparesis, cyclic vomiting, marijuana use-multiple ER visits-presenting with abdominal pain/vomiting. See below for further details.  Significant events: 2/15>> presented to ED with abdominal pain/vomiting-admitted for further eval/treatment.  Significant studies: 2/15>> chest x-ray: No active disease 2/16>> x-ray abdomen: No SBO   Antimicrobial therapy: None  Microbiology data: None  Procedures : None  Consults: GI  DVT Prophylaxis : enoxaparin (LOVENOX) injection 40 mg Start: 02/22/20 1000   Subjective: Seen earlier this morning when she felt much better-and wanted to go home-as abdominal pain had slowly improved and vomiting had essentially resolved overnight.  Over the course of the day-her abdominal pain has worsened-vomiting has reoccurred.  Assessment/Plan: Nausea/vomiting/abdominal pain: Has some amount of chronic abdominal pain-x-ray on admission was negative for SBO.  Had improved somewhat-but again having recurrent/worsening abdominal pain vomiting.  Pain appears to be somewhat colicky in nature.  Keep n.p.o. and await GI evaluation.  Continue PPI/Reglan and as needed antiemetics.  Minimize narcotics as much as possible-unless has intractable abdominal pain.   Constipation: Claims no BM x2 weeks-no response to MiraLAX/senna/Dulcolax and smog enema.  Await GI input-might end up needing GoLYTELY.  Asymptomatic bacteriuria: No role for antibiotics  HLD: Stable-continue statin  HTN: Continue propanolol  History of seizures/pseudoseizures: Stable-have resumed Lamictal and Topamax  History of anxiety/depression: Stable-continue Seroquel, Remeron and  BuSpar.  DM-2 (A1c 11.7 on 11/25/2019): CBGs relatively stable-continue Lantus 45 units, SSI-as diet advances-we will slowly uptitrate her insulin regimen.  Recent Labs    02/22/20 2059 02/23/20 0611 02/23/20 1107  GLUCAP 144* 194* 309*    Obesity: Estimated body mass index is 34.19 kg/m as calculated from the following:   Height as of 02/19/20: 5\' 3"  (1.6 m).   Weight as of 02/19/20: 87.5 kg.     Diet: Diet Order            Diet heart healthy/carb modified Room service appropriate? Yes; Fluid consistency: Thin  Diet effective now                  Code Status: Full code   Family Communication: None at bedside  Disposition Plan: Status is: Inpatient   Dispo: The patient is from: Home              Anticipated d/c is to: Home              Anticipated d/c date is: 1 day              Patient currently is not medically stable to d/c.   Difficult to place patient No   Barriers to Discharge: Continues to have intractable abdominal pain/vomiting-back to n.p.o. today.  Antimicrobial agents: Anti-infectives (From admission, onward)   None       Time spent: 25 minutes-Greater than 50% of this time was spent in counseling, explanation of diagnosis, planning of further management, and coordination of care.  MEDICATIONS: Scheduled Meds: . ARIPiprazole  10 mg Oral Daily  . atorvastatin  20 mg Oral Daily  . busPIRone  10 mg Oral BID  . enoxaparin (LOVENOX) injection  40 mg  Subcutaneous Daily  . insulin aspart  0-9 Units Subcutaneous TID WC  . insulin glargine  45 Units Subcutaneous Daily  . lamoTRIgine  50 mg Oral BID  . lubiprostone  24 mcg Oral BID WC  . metoCLOPramide (REGLAN) injection  5 mg Intravenous Q8H  . mirtazapine  7.5 mg Oral QHS  .  morphine injection  1 mg Intravenous Once  . [START ON 02/24/2020] pantoprazole  40 mg Oral Daily  . propranolol  20 mg Oral BID  . QUEtiapine  25 mg Oral q morning  . QUEtiapine  300 mg Oral QHS  . senna-docusate  2  tablet Oral QHS  . topiramate  25 mg Oral BID  . vitamin B-12  1,000 mcg Oral Daily   Continuous Infusions:  PRN Meds:.acetaminophen **OR** acetaminophen, ondansetron (ZOFRAN) IV, promethazine   PHYSICAL EXAM: Vital signs: Vitals:   02/22/20 1959 02/22/20 2356 02/23/20 0429 02/23/20 1109  BP: 129/79 125/81 99/62 (!) 128/91  Pulse: 89 85 83 94  Resp: 16 18 19 18   Temp: 98.3 F (36.8 C) 98 F (36.7 C) 97.9 F (36.6 C) 98.5 F (36.9 C)  TempSrc: Oral Oral Oral Oral  SpO2: 100% 100% 100% 100%   There were no vitals filed for this visit. There is no height or weight on file to calculate BMI.   Gen Exam:Alert awake-not in any distress HEENT:atraumatic, normocephalic Chest: B/L clear to auscultation anteriorly CVS:S1S2 regular Abdomen:soft non tender, non distended Extremities:no edema Neurology: Non focal Skin: no rash  I have personally reviewed following labs and imaging studies  LABORATORY DATA: CBC: Recent Labs  Lab 02/19/20 0750 02/19/20 0755 02/19/20 0820 02/21/20 1944 02/21/20 2122 02/22/20 0600  WBC 9.8  --   --  11.1*  --  10.3  NEUTROABS 7.9*  --   --   --   --   --   HGB 12.9 13.9 14.3 13.7 14.3 11.5*  HCT 40.9 41.0 42.0 43.0 42.0 36.8  MCV 77.2*  --   --  77.5*  --  79.7*  PLT 374  --   --  403*  --  302    Basic Metabolic Panel: Recent Labs  Lab 02/19/20 0750 02/19/20 0755 02/19/20 0820 02/19/20 1346 02/21/20 1944 02/21/20 2110 02/21/20 2122 02/22/20 0600 02/23/20 0658  NA 133* 135   < > 137 137  --  136 136 137  K 3.4* 3.4*   < > 3.8 3.7  --  3.6 3.1* 3.3*  CL 97* 99  --  104 98  --   --  99 102  CO2 21*  --   --  25 25  --   --  26 25  GLUCOSE 446* 449*  --  206* 327*  --   --  201* 236*  BUN 6 6  --  <5* 7  --   --  8 7  CREATININE 0.66 0.40*  --  0.54 0.71  --   --  0.53 0.67  CALCIUM 9.5  --   --  9.0 9.7  --   --  8.5* 8.8*  MG  --   --   --   --   --  1.9  --   --  1.9   < > = values in this interval not displayed.     GFR: Estimated Creatinine Clearance: 116.6 mL/min (by C-G formula based on SCr of 0.67 mg/dL).  Liver Function Tests: Recent Labs  Lab 02/21/20 1944  AST 23  ALT 15  ALKPHOS 86  BILITOT 1.2  PROT 7.0  ALBUMIN 4.1   Recent Labs  Lab 02/21/20 1944  LIPASE 30   No results for input(s): AMMONIA in the last 168 hours.  Coagulation Profile: No results for input(s): INR, PROTIME in the last 168 hours.  Cardiac Enzymes: No results for input(s): CKTOTAL, CKMB, CKMBINDEX, TROPONINI in the last 168 hours.  BNP (last 3 results) No results for input(s): PROBNP in the last 8760 hours.  Lipid Profile: No results for input(s): CHOL, HDL, LDLCALC, TRIG, CHOLHDL, LDLDIRECT in the last 72 hours.  Thyroid Function Tests: No results for input(s): TSH, T4TOTAL, FREET4, T3FREE, THYROIDAB in the last 72 hours.  Anemia Panel: No results for input(s): VITAMINB12, FOLATE, FERRITIN, TIBC, IRON, RETICCTPCT in the last 72 hours.  Urine analysis:    Component Value Date/Time   COLORURINE YELLOW 02/21/2020 2348   APPEARANCEUR HAZY (A) 02/21/2020 2348   LABSPEC 1.032 (H) 02/21/2020 2348   PHURINE 7.0 02/21/2020 2348   GLUCOSEU >=500 (A) 02/21/2020 2348   HGBUR SMALL (A) 02/21/2020 2348   BILIRUBINUR NEGATIVE 02/21/2020 2348   BILIRUBINUR negative 07/13/2018 1651   KETONESUR 80 (A) 02/21/2020 2348   PROTEINUR NEGATIVE 02/21/2020 2348   UROBILINOGEN 0.2 07/13/2018 1651   UROBILINOGEN 1.0 07/09/2017 1324   NITRITE NEGATIVE 02/21/2020 2348   LEUKOCYTESUR SMALL (A) 02/21/2020 2348    Sepsis Labs: Lactic Acid, Venous    Component Value Date/Time   LATICACIDVEN 2.4 (HH) 08/23/2019 1020    MICROBIOLOGY: Recent Results (from the past 240 hour(s))  Culture, Urine     Status: Abnormal (Preliminary result)   Collection Time: 02/22/20  4:24 AM   Specimen: Urine, Random  Result Value Ref Range Status   Specimen Description URINE, RANDOM  Final   Special Requests NONE  Final   Culture  (A)  Final    >=100,000 COLONIES/mL ESCHERICHIA COLI SUSCEPTIBILITIES TO FOLLOW Performed at [facility], [address], [place], [place] [address]    Report Status PENDING  Incomplete  SARS CORONAVIRUS 2 (TAT 6-24 HRS) Nasopharyngeal Nasopharyngeal Swab     Status: None   Collection Time: 02/22/20 10:38 AM   Specimen: Nasopharyngeal Swab  Result Value Ref Range Status   SARS Coronavirus 2 NEGATIVE NEGATIVE Final    Comment: (NOTE) SARS-CoV-2 target nucleic acids are NOT DETECTED.  The SARS-CoV-2 RNA is generally detectable in upper and lower respiratory specimens during the acute phase of infection. Negative results do not preclude SARS-CoV-2 infection, do not rule out co-infections with other pathogens, and should not be used as the sole basis for treatment or other patient management decisions. Negative results must be combined with clinical observations, patient history, and epidemiological information. The expected result is Negative.  Fact Sheet for Patients: HairSlick.no  Fact Sheet for Healthcare Providers: quierodirigir.com  This test is not yet approved or cleared by the [place] FDA and  has been authorized for detection and/or diagnosis of SARS-CoV-2 by FDA under an Emergency Use Authorization (EUA). This EUA will remain  in effect (meaning this test can be used) for the duration of the COVID-19 declaration under Se ction 564(b)(1) of the Act, 21 U.S.C. section 360bbb-3(b)(1), unless the authorization is terminated or revoked sooner.  Performed at [facility], [address], [place], [place] [address]     RADIOLOGY STUDIES/RESULTS: DG Abd 1 View  Result Date: 02/22/2020 CLINICAL DATA:  Abdominal pain EXAM: ABDOMEN - 1 VIEW COMPARISON:  01/14/2020 FINDINGS: The  bowel gas pattern is normal. No radio-opaque calculi or other significant radiographic abnormality are seen. IMPRESSION:  Negative. Electronically Signed   By: Deatra Robinson M.D.   On: 02/22/2020 00:44   DG Chest Portable 1 View  Result Date: 02/21/2020 CLINICAL DATA:  Vomiting. EXAM: PORTABLE CHEST 1 VIEW COMPARISON:  February 19, 2020 FINDINGS: The heart size and mediastinal contours are within normal limits. Both lungs are clear. The visualized skeletal structures are unremarkable. IMPRESSION: No active disease. Electronically Signed   By: Aram Candela M.D.   On: 02/21/2020 21:12     LOS: 1 day   Jeoffrey Massed, MD  [facility]    To contact the attending provider between 7A-7P or the covering provider during after hours 7P-7A, please log into the web site [email or web address] and access using universal [place] password for that web site. If you do not have the password, please call the hospital operator.  02/23/2020, 1:43 PM

## 2020-02-23 NOTE — Progress Notes (Signed)
PHARMACIST - PHYSICIAN COMMUNICATION  DR:   Jerral Ralph  CONCERNING: IV to Oral Route Change Policy  RECOMMENDATION: This patient is receiving Protonix by the intravenous route.  Based on criteria approved by the Pharmacy and Therapeutics Committee, the intravenous medication(s) is/are being converted to the equivalent oral dose form(s).   DESCRIPTION: These criteria include:  The patient is eating (either orally or via tube) and/or has been taking other orally administered medications for a least 24 hours  The patient has no evidence of active gastrointestinal bleeding or impaired GI absorption (gastrectomy, short bowel, patient on TNA or NPO).  If you have questions about this conversion, please contact the Pharmacy Department  []   [phone] )  ( [phone] []   [phone] )  [facility] [x]   [phone] )  [facility] []   [phone] )  [facility] []   [phone] )  [facility]   [place], [facility] 02/23/2020 9:20 AM

## 2020-02-23 NOTE — Progress Notes (Signed)
Dr. Jerral Ralph notified of patient still having severe abdominal pain and nausea and vomiting.

## 2020-02-23 NOTE — Progress Notes (Signed)
No output after enema-per RN-she continues to have abdominal pain and nausea-and is now requesting that she see her primary gastroenterologist.  We will ask gastroenterology to consult.

## 2020-02-23 NOTE — Consult Note (Addendum)
Referring Provider:  [facility]         Primary Care Physician:  Fleet Contras, MD Primary Gastroenterologist:   Ileene Patrick, MD           We were asked to see this patient for:  constipation            ASSESSMENT / PLAN:   # 20 yo female with chronic nausea, vomiting, abdominal pain with extensive negative GI workup over last few years.  Previous gastric emptying scans x 2 were normal. EGD x2 unrevealing. Numerous unrevealing CT scan, ultrasouns. She reports 5 episodes of vomiting this am. UDS positiive for THC so could have hyperemesis from cannabis --Trial of Erythromycin.  --Avoid narcotics.  --Continue IV Reglan Q 8 --No plans for repeat endoscopic evaluation    # Chronic constipation. Patient says she averages 2 BMs a month taking TID miralax and BID Amitiza. She failed Motegrity. Her last BM was in January. No results from [facility] this am. Probably has slow transit constipation. KUB yesterday does not show a significant stool burden.      Attending Physician Note   I have taken a history, examined the patient and reviewed the chart. I agree with the Advanced Practitioner's note, impression and recommendations.  Chronic nausea, vomiting, abdominal pain, constipation with an extensive GI evaluation over the past few years. Cannabis hyperemesis is a potential diagnosis or at least a contributing diagnosis. Likely has chronic idiopathic constipation. Abdominal pain is likely functional exacerbated by abdominal wall pain with recurrent vomiting. Constipation may also contribute to abdominal pain.   * Stop all THC use  * Trial of erythromycin tid ac, change ondansetron to tid (not prn), continue metoclopramide tid * Avoid narcotics.  * Continue Amitiza, Miralax and Senokot-S for constipation  * No additional GI evaluation at this time  * Goal is for symptom improvement   Claudette Head, MD [facility] [phone]   HPI:                                                                                                                              Chief Complaint:   Nancy [name] is a 20 y.o. female with diabetes, conversion disorder, seizure vrs pseudoseizures, obesity, polysubstance abuse, anxiety, depression, obesity chronic nausea / vomiting and chronic abdominal pain. She has had frequent ED visits and admissions for DKA, nausea, vomiting and abdominal pain    Patient followed by Dr. Adela Lank for her chronic GI symptoms. She has had 2 EGDs in the past for these issue which have been normal other than retained fluid She has had numerous unrevealing imaging studies for these complaints including numerous CT scan.  She has suspected gastroparesis however her gastric emptying study in 2019.   She has had at least 3 abdominal ultrasounds and no pathology found to account for symptoms.   Patient presented to ED two days ago with generalized abdominal pain, nasuea and vomiting. with KUB  yesterday was negative. LFTs, lipase normal. Ur preg ntest negative. UDS positive for THC. She was given IVF, IV reglan, PPI and Zofran. KUB negative.   At home patient says she takes Reglan 3 times a day before meals and uses Zofran as needed for breakthrough. She had been doing fairly well until a couple of weeks ago when she started vomiting several times a day.  Her symptoms improved inpatient and she was nearing discharged but then developed recurrent symptoms after receiving a SMOG enema today. She has since vomited 5 times with emesis containing particles of breakfast She averages one BM twice a month despite taking Miralax everyday TID and Amitiza BID. She says there was no return from [facility] enema today but she doesn't feel constipated.   PREVIOUS ENDOSCOPIC EVALUATIONS / PERTINENT STUDIES   02/23/20 KUB -negative.   01/29/20 Abdominal US -negative  March 2021 gastric emptying study -normal.   Oct 2020 EGD -Mild Candida esophagitis. -Mild gastritis. -Some retained food  without mechanical outlet obstruction (likely due to recent p.o. intake)  August 2020 RUQ U/S - negative  January 2020 RUQ U/S - negative.  04/27/2017 EGD  - Esophagogastric landmarks were identified: the Z-line was found at 36 cm, the gastroesophageal junction was found at 36 cm and the upper extent of the gastric folds was found at 36 cm from the incisors. Findings: - The exam of the esophagus was otherwise normal. - Excessive retained fluid was found in the gastric body, raising concern for gastroparesis. - The exam of the stomach was otherwise normal. - Biopsies were taken with a cold forceps in the gastric body, at the incisura and in the gastric antrum for Helicobacter pylori testing. - The duodenal bulb and second portion of the duodenum were normal.  May 2019  4 hours gastric emptying --Normal emptying  **Numerous CT scans 2018-2021 for nausea, vomiting and abdominal pain.     Past Medical History:  Diagnosis Date  . Acanthosis nigricans   . Anxiety   . CHF (congestive heart failure) (HCC)   . Chronic lower back pain   . Depression   . DKA, type 1 (HCC) 09/13/2018  . Dyspepsia   . Obesity   . Ovarian cyst    pt is not aware of this hx (11/24/2017)  . 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 Premature baby   . Seizures (HCC)   . Type II diabetes mellitus (HCC)    insulin dependant    Past Surgical History:  Procedure Laterality Date  . ABDOMINAL HERNIA REPAIR     "I was a baby"  . BIOPSY  10/12/2018   Procedure: BIOPSY;  Surgeon: Lynann Bologna, MD;  Location: [facility] ENDOSCOPY;  Service: Endoscopy;;  . ESOPHAGOGASTRODUODENOSCOPY (EGD) WITH PROPOFOL N/A 10/12/2018   Procedure: ESOPHAGOGASTRODUODENOSCOPY (EGD) WITH PROPOFOL;  Surgeon: Lynann Bologna, MD;  Location: [facility] ENDOSCOPY;  Service: Endoscopy;  Laterality: N/A;  . HERNIA REPAIR    . LEFT HEART CATH AND CORONARY ANGIOGRAPHY N/A 10/13/2018   Procedure: LEFT HEART CATH AND CORONARY ANGIOGRAPHY;  Surgeon: Kathleene Hazel, MD;  Location: [facility] INVASIVE CV LAB;  Service: Cardiovascular;  Laterality: N/A;  . TONSILLECTOMY AND ADENOIDECTOMY    . WISDOM TOOTH EXTRACTION  2017    Prior to Admission medications   Medication Sig Start Date End Date Taking? Authorizing Provider  ARIPiprazole (ABILIFY) 10 MG tablet Take 10 mg by mouth in the morning and at bedtime.    Yes [provider]  atorvastatin (LIPITOR) 20 MG tablet  Take 1 tablet (20 mg total) by mouth daily at 6 PM. Patient taking differently: Take 20 mg by mouth daily. 10/05/18  Yes Storm FriskWright, Patrick E, MD  busPIRone (BUSPAR) 10 MG tablet Take 10 mg by mouth 2 (two) times daily.  11/19/18  Yes [provider]  Cyanocobalamin (VITAMIN B-12 PO) Take 1 tablet by mouth daily.   Yes [provider]  diclofenac Sodium (VOLTAREN) 1 % GEL Apply 1 application topically 2 (two) times daily as needed (chest pains).   Yes [provider]  insulin aspart (NOVOLOG FLEXPEN) 100 UNIT/ML FlexPen Inject 15 Units into the skin 2 (two) times daily before a meal.   Yes [provider]  insulin glargine (LANTUS) 100 unit/mL SOPN Inject 70 Units into the skin at bedtime.   Yes [provider]  lamoTRIgine (LAMICTAL) 25 MG tablet Take 2 tablets (50 mg total) by mouth 2 (two) times daily. For mood stabilization 11/11/18  Yes Nwoko, Agnes I, NP  lidocaine (LIDODERM) 5 % Place 1 patch onto the skin daily as needed (pain). Remove & Discard patch within 12 hours or as directed by MD   Yes [provider]  lubiprostone (AMITIZA) 24 MCG capsule Take 1 capsule (24 mcg total) by mouth 2 (two) times daily with a meal. Patient taking differently: Take 24 mcg by mouth daily with breakfast. 10/27/18  Yes Esterwood, Amy S, PA-C  metoCLOPramide (REGLAN) 5 MG tablet Take 1 tablet (5 mg total) by mouth 3 (three) times daily. 10/03/19  Yes Jerald Kiefhiu, Stephen K, MD  mirtazapine (REMERON) 7.5 MG tablet Take 1 tablet (7.5 mg total) by mouth at  bedtime. For depression/sleep 11/11/18  Yes Armandina StammerNwoko, Agnes I, NP  Multiple Vitamin (MULTIVITAMIN WITH MINERALS) TABS tablet Take 1 tablet by mouth daily. 09/18/18  Yes Clapacs, Jackquline DenmarkJohn T, MD  pantoprazole (PROTONIX) 40 MG tablet Take 1 tablet (40 mg total) by mouth 2 (two) times daily. For acid reflux 01/29/19  Yes Jae DireSegal, Jared E, MD  polyethylene glycol (MIRALAX / GLYCOLAX) 17 g packet Take 17 g by mouth 2 (two) times daily. 01/16/20  Yes Swayze, Ava, DO  prochlorperazine (COMPAZINE) 25 MG suppository Place 1 suppository (25 mg total) rectally every 12 (twelve) hours as needed for nausea or vomiting. 12/05/18  Yes Melene PlanFloyd, Dan, DO  propranolol (INDERAL) 20 MG tablet Take 1 tablet (20 mg total) by mouth 2 (two) times daily. For anxiety/HTN 12/23/18  Yes Swayze, Ava, DO  QUEtiapine (SEROQUEL) 100 MG tablet Take 300 mg by mouth at bedtime. 07/05/19  Yes [provider]  QUEtiapine (SEROQUEL) 25 MG tablet Take 25 mg by mouth every morning.   Yes [provider]  sucralfate (CARAFATE) 1 GM/10ML suspension Take 1 g by mouth at bedtime as needed (stomach pain). 07/05/19  Yes [provider]  topiramate (TOPAMAX) 25 MG tablet Take 1 tablet (25 mg total) by mouth 2 (two) times daily. For seizure activities 11/11/18  Yes Armandina StammerNwoko, Agnes I, NP  amoxicillin (AMOXIL) 500 MG tablet Take 500 mg by mouth See admin instructions. Patient not taking: Reported on 02/22/2020 02/14/20   [provider]  dicyclomine (BENTYL) 10 MG capsule Take 1 capsule (10 mg total) by mouth 4 (four) times daily as needed for spasms. Patient not taking: No sig reported 11/21/19   Linwood DibblesKnapp, Jon, MD  senna-docusate (SENOKOT-S) 8.6-50 MG tablet Take 1 tablet by mouth 2 (two) times daily. Patient not taking: Reported on 02/22/2020 01/16/20   Swayze, Ava, DO  ULTRAM 50 MG tablet  Take 50 mg by mouth every 6 (six) hours as needed for moderate pain. Patient not taking: Reported on 02/22/2020 02/14/20   [provider]     Current Facility-Administered Medications  Medication Dose Route Frequency Provider Last Rate Last Admin  . acetaminophen (TYLENOL) tablet 650 mg  650 mg Oral Q6H PRN John Giovanni, MD   650 mg at 02/23/20 0132   Or  . acetaminophen (TYLENOL) suppository 650 mg  650 mg Rectal Q6H PRN John Giovanni, MD      . ARIPiprazole (ABILIFY) tablet 10 mg  10 mg Oral Daily Maretta Bees, MD   10 mg at 02/23/20 0904  . atorvastatin (LIPITOR) tablet 20 mg  20 mg Oral Daily Maretta Bees, MD   20 mg at 02/23/20 0903  . busPIRone (BUSPAR) tablet 10 mg  10 mg Oral BID Maretta Bees, MD   10 mg at 02/23/20 0904  . enoxaparin (LOVENOX) injection 40 mg  40 mg Subcutaneous Daily John Giovanni, MD   40 mg at 02/23/20 0903  . insulin aspart (novoLOG) injection 0-9 Units  0-9 Units Subcutaneous TID WC Maretta Bees, MD   7 Units at 02/23/20 1114  . insulin glargine (LANTUS) injection 45 Units  45 Units Subcutaneous Daily John Giovanni, MD   45 Units at 02/23/20 0904  . lactated ringers infusion   Intravenous Continuous Maretta Bees, MD 100 mL/hr at 02/23/20 1411 New Bag at 02/23/20 1411  . lamoTRIgine (LAMICTAL) tablet 50 mg  50 mg Oral BID Maretta Bees, MD   50 mg at 02/23/20 0903  . lubiprostone (AMITIZA) capsule 24 mcg  24 mcg Oral BID WC Maretta Bees, MD   24 mcg at 02/23/20 0903  . metoCLOPramide (REGLAN) injection 5 mg  5 mg Intravenous Q8H John Giovanni, MD   5 mg at 02/23/20 1340  . mirtazapine (REMERON) tablet 7.5 mg  7.5 mg Oral QHS Maretta Bees, MD   7.5 mg at 02/22/20 2122  . ondansetron (ZOFRAN) injection 4 mg  4 mg Intravenous Q6H PRN John Giovanni, MD   4 mg at 02/23/20 1003  . [START ON 02/24/2020] pantoprazole (PROTONIX) EC tablet 40 mg  40 mg Oral Daily Pham, Minh Q, RPH-CPP      . polyethylene glycol (MIRALAX / GLYCOLAX) packet 17 g  17 g Oral Daily Ghimire, Shanker M, MD      . promethazine (PHENERGAN) injection 25 mg  25  mg Intravenous Q6H PRN Maretta Bees, MD   25 mg at 02/23/20 1339  . propranolol (INDERAL) tablet 20 mg  20 mg Oral BID Maretta Bees, MD   20 mg at 02/23/20 0903  . QUEtiapine (SEROQUEL) tablet 25 mg  25 mg Oral q morning Maretta Bees, MD   25 mg at 02/23/20 0907  . QUEtiapine (SEROQUEL) tablet 300 mg  300 mg Oral QHS Maretta Bees, MD   300 mg at 02/22/20 2121  . senna-docusate (Senokot-S) tablet 2 tablet  2 tablet Oral QHS Maretta Bees, MD   2 tablet at 02/22/20 2121  . topiramate (TOPAMAX) tablet 25 mg  25 mg Oral BID Maretta Bees, MD   25 mg at 02/23/20 0903  . vitamin B-12 (CYANOCOBALAMIN) tablet 1,000 mcg  1,000 mcg Oral Daily Maretta Bees, MD   1,000 mcg at 02/23/20 1610    Allergies as of 02/21/2020 - Review Complete 02/21/2020  Allergen Reaction Noted  . Tomato Anaphylaxis 08/24/2019  .  Ibuprofen Other (See Comments) 01/16/2018  . Oatmeal Rash 11/06/2018    Family History  Problem Relation Age of Onset  . Diabetes Mother   . Hypertension Mother   . Obesity Mother   . Asthma Mother   . Allergic rhinitis Mother   . Eczema Mother   . Cervical cancer Mother   . Diabetes Father   . Hypertension Father   . Obesity Father   . Hyperlipidemia Father   . Hypertension Paternal Aunt   . Hypertension Maternal Grandfather   . Colon cancer Maternal Grandfather   . Diabetes Paternal Grandmother   . Obesity Paternal Grandmother   . Diabetes Paternal Grandfather   . Obesity Paternal Grandfather   . Angioedema Neg Hx   . Immunodeficiency Neg Hx   . Urticaria Neg Hx   . Stomach cancer Neg Hx   . Esophageal cancer Neg Hx     Social History   Socioeconomic History  . Marital status: Single    Spouse name: Not on file  . Number of children: 0  . Years of education: Not on file  . Highest education level: Not on file  Occupational History  . Occupation: Admission  Tobacco Use  . Smoking status: Never Smoker  . Smokeless tobacco: Never  Used  Vaping Use  . Vaping Use: Never used  Substance and Sexual Activity  . Alcohol use: No    Alcohol/week: 0.0 standard drinks  . Drug use: No  . Sexual activity: Not Currently  Other Topics Concern  . Not on file  Social History Narrative   Lives with mom and mom's girlfriend.   No EtOH, tobacco, Drugs   Social Determinants of Health   Financial Resource Strain: Not on file  Food Insecurity: Not on file  Transportation Needs: Not on file  Physical Activity: Not on file  Stress: Not on file  Social Connections: Not on file  Intimate Partner Violence: Not on file    Review of Systems: All systems reviewed and negative except where noted in HPI.  OBJECTIVE:    Physical Exam: Vital signs in last 24 hours: Temp:  [97.9 F (36.6 C)-98.6 F (37 C)] 98.5 F (36.9 C) (02/17 1109) Pulse Rate:  [83-94] 94 (02/17 1109) Resp:  [16-19] 18 (02/17 1109) BP: (99-129)/(62-91) 128/91 (02/17 1109) SpO2:  [100 %] 100 % (02/17 1109) Last BM Date:  (1 month ago per pt. home enema 2/8 per pt report) General:   Alert female in NAD. Holding her stomach and rocking back and forth grimacing Psych:  Cooperative.  Eyes:  Pupils equal, sclera clear, no icterus.   Conjunctiva pink. Ears:  Normal auditory acuity. Nose:  No deformity, discharge,  or lesions. Neck:  Supple; no masses Lungs:  Clear throughout to auscultation.   No wheezes, crackles, or rhonchi.  Heart:  Regular rate and rhythm; no murmurs, no lower extremity edema Abdomen:  Soft, non-distended, generalized tenderness even with light palpation. BS active, no palp mass   Rectal:  Deferred  Msk:  Symmetrical without gross deformities. . Neurologic:  Alert and  oriented x4;  grossly normal neurologically. Skin:  Intact without significant lesions or rashes.  Scheduled inpatient medications . ARIPiprazole  10 mg Oral Daily  . atorvastatin  20 mg Oral Daily  . busPIRone  10 mg Oral BID  . enoxaparin (LOVENOX) injection  40 mg  Subcutaneous Daily  . insulin aspart  0-9 Units Subcutaneous TID WC  . insulin glargine  45 Units Subcutaneous Daily  .  lamoTRIgine  50 mg Oral BID  . lubiprostone  24 mcg Oral BID WC  . metoCLOPramide (REGLAN) injection  5 mg Intravenous Q8H  . mirtazapine  7.5 mg Oral QHS  . [START ON 02/24/2020] pantoprazole  40 mg Oral Daily  . polyethylene glycol  17 g Oral Daily  . propranolol  20 mg Oral BID  . QUEtiapine  25 mg Oral q morning  . QUEtiapine  300 mg Oral QHS  . senna-docusate  2 tablet Oral QHS  . topiramate  25 mg Oral BID  . vitamin B-12  1,000 mcg Oral Daily      Intake/Output from previous day: 02/16 0701 - 02/17 0700 In: 360 [P.O.:360] Out: -  Intake/Output this shift: Total I/O In: 600 [P.O.:600] Out: 400 [Emesis/NG output:400]   Lab Results: Recent Labs    02/21/20 1944 02/21/20 2122 02/22/20 0600  WBC 11.1*  --  10.3  HGB 13.7 14.3 11.5*  HCT 43.0 42.0 36.8  PLT 403*  --  302   BMET Recent Labs    02/21/20 1944 02/21/20 2122 02/22/20 0600 02/23/20 0658  NA 137 136 136 137  K 3.7 3.6 3.1* 3.3*  CL 98  --  99 102  CO2 25  --  26 25  GLUCOSE 327*  --  201* 236*  BUN 7  --  8 7  CREATININE 0.71  --  0.53 0.67  CALCIUM 9.7  --  8.5* 8.8*   LFT Recent Labs    02/21/20 1944  PROT 7.0  ALBUMIN 4.1  AST 23  ALT 15  ALKPHOS 86  BILITOT 1.2   PT/INR No results for input(s): LABPROT, INR in the last 72 hours. Hepatitis Panel No results for input(s): HEPBSAG, HCVAB, HEPAIGM, HEPBIGM in the last 72 hours.   . CBC Latest Ref Rng & Units 02/22/2020 02/21/2020 02/21/2020  WBC 4.0 - 10.5 K/uL 10.3 - 11.1(H)  Hemoglobin 12.0 - 15.0 g/dL 11.5(L) 14.3 13.7  Hematocrit 36.0 - 46.0 % 36.8 42.0 43.0  Platelets 150 - 400 K/uL 302 - 403(H)    . CMP Latest Ref Rng & Units 02/23/2020 02/22/2020 02/21/2020  Glucose 70 - 99 mg/dL 797(K) 820(U) -  BUN 6 - 20 mg/dL 7 8 -  Creatinine 0.15 - 1.00 mg/dL 6.15 3.79 -  Sodium 432 - 145 mmol/L 137 136 136   Potassium 3.5 - 5.1 mmol/L 3.3(L) 3.1(L) 3.6  Chloride 98 - 111 mmol/L 102 99 -  CO2 22 - 32 mmol/L 25 26 -  Calcium 8.9 - 10.3 mg/dL 7.6(D) 4.7(W) -  Total Protein 6.5 - 8.1 g/dL - - -  Total Bilirubin 0.3 - 1.2 mg/dL - - -  Alkaline Phos 38 - 126 U/L - - -  AST 15 - 41 U/L - - -  ALT 0 - 44 U/L - - -   Studies/Results: DG Abd 1 View  Result Date: 02/22/2020 CLINICAL DATA:  Abdominal pain EXAM: ABDOMEN - 1 VIEW COMPARISON:  01/14/2020 FINDINGS: The bowel gas pattern is normal. No radio-opaque calculi or other significant radiographic abnormality are seen. IMPRESSION: Negative. Electronically Signed   By: Deatra Robinson M.D.   On: 02/22/2020 00:44   DG Chest Portable 1 View  Result Date: 02/21/2020 CLINICAL DATA:  Vomiting. EXAM: PORTABLE CHEST 1 VIEW COMPARISON:  February 19, 2020 FINDINGS: The heart size and mediastinal contours are within normal limits. Both lungs are clear. The visualized skeletal structures are unremarkable. IMPRESSION: No active disease. Electronically Signed  By: Aram Candela M.D.   On: 02/21/2020 21:12    Principal Problem:   Intractable nausea and vomiting Active Problems:   Chest pain   Hyperglycemia   Abdominal pain   Chronic constipation   Nausea & vomiting    Willette Cluster, NP-C @  02/23/2020, 3:07 PM

## 2020-02-23 NOTE — Progress Notes (Signed)
She feels much better and is requesting discharge today. No vomiting-tolerating advancement in diet-some intermittent abdominal pain that is chronic for her-claims that the pain is only slightly worse than her baseline. She had no BM following Dulcolax suppository/MiraLAX/senna-plan is to give her a enema today-following which she can likely be discharged home. Abdomen exam continues to be very benign.

## 2020-02-24 ENCOUNTER — Encounter (HOSPITAL_COMMUNITY): Payer: Self-pay | Admitting: Internal Medicine

## 2020-02-24 DIAGNOSIS — R112 Nausea with vomiting, unspecified: Secondary | ICD-10-CM | POA: Diagnosis not present

## 2020-02-24 DIAGNOSIS — R1084 Generalized abdominal pain: Secondary | ICD-10-CM | POA: Diagnosis not present

## 2020-02-24 LAB — RETICULOCYTES
Immature Retic Fract: 6.8 % (ref 2.3–15.9)
RBC.: 4.75 MIL/uL (ref 3.87–5.11)
Retic Count, Absolute: 61.3 K/uL (ref 19.0–186.0)
Retic Ct Pct: 1.3 % (ref 0.4–3.1)

## 2020-02-24 LAB — BASIC METABOLIC PANEL WITH GFR
Anion gap: 8 (ref 5–15)
BUN: 5 mg/dL — ABNORMAL LOW (ref 6–20)
CO2: 23 mmol/L (ref 22–32)
Calcium: 8.6 mg/dL — ABNORMAL LOW (ref 8.9–10.3)
Chloride: 105 mmol/L (ref 98–111)
Creatinine, Ser: 0.58 mg/dL (ref 0.44–1.00)
GFR, Estimated: >60 mL/min (ref >60)
Glucose, Bld: 187 mg/dL — ABNORMAL HIGH (ref 70–99)
Potassium: 3 mmol/L — ABNORMAL LOW (ref 3.5–5.1)
Sodium: 136 mmol/L (ref 135–145)

## 2020-02-24 LAB — URINE CULTURE: Culture: >=100000 — AB

## 2020-02-24 LAB — IRON AND TIBC
Iron: 58 ug/dL (ref 28–170)
Saturation Ratios: 17 % (ref 10.4–31.8)
TIBC: 337 ug/dL (ref 250–450)
UIBC: 279 ug/dL

## 2020-02-24 LAB — HEMOGLOBIN A1C
Hgb A1c MFr Bld: 11.5 % — ABNORMAL HIGH (ref 4.8–5.6)
Mean Plasma Glucose: 283.35 mg/dL

## 2020-02-24 LAB — CBC
HCT: 35.6 % — ABNORMAL LOW (ref 36.0–46.0)
Hemoglobin: 12 g/dL (ref 12.0–15.0)
MCH: 25.6 pg — ABNORMAL LOW (ref 26.0–34.0)
MCHC: 33.7 g/dL (ref 30.0–36.0)
MCV: 75.9 fL — ABNORMAL LOW (ref 80.0–100.0)
Platelets: 305 K/uL (ref 150–400)
RBC: 4.69 MIL/uL (ref 3.87–5.11)
RDW: 15.6 % — ABNORMAL HIGH (ref 11.5–15.5)
WBC: 7.1 K/uL (ref 4.0–10.5)
nRBC: 0 % (ref 0.0–0.2)

## 2020-02-24 LAB — GLUCOSE, CAPILLARY
Glucose-Capillary: 150 mg/dL — ABNORMAL HIGH (ref 70–99)
Glucose-Capillary: 321 mg/dL — ABNORMAL HIGH (ref 70–99)
Glucose-Capillary: 169 mg/dL — ABNORMAL HIGH (ref 70–99)
Glucose-Capillary: 141 mg/dL — ABNORMAL HIGH (ref 70–99)

## 2020-02-24 LAB — MAGNESIUM: Magnesium: 1.9 mg/dL (ref 1.7–2.4)

## 2020-02-24 LAB — FERRITIN: Ferritin: 20 ng/mL (ref 11–307)

## 2020-02-24 MED ORDER — MAGNESIUM SULFATE 2 GM/50ML IV SOLN
2.0000 g | Freq: Once | INTRAVENOUS | Status: AC
  Administered 2020-02-24: 2 g via INTRAVENOUS
  Filled 2020-02-24: qty 50

## 2020-02-24 MED ORDER — POTASSIUM CHLORIDE 10 MEQ/100ML IV SOLN
10.0000 meq | INTRAVENOUS | Status: AC
  Administered 2020-02-24 (×3): 10 meq via INTRAVENOUS
  Filled 2020-02-24 (×3): qty 100

## 2020-02-24 MED ORDER — CAPSAICIN 0.025 % EX CREA
TOPICAL_CREAM | Freq: Two times a day (BID) | CUTANEOUS | Status: DC
  Filled 2020-02-24: qty 60

## 2020-02-24 NOTE — Progress Notes (Signed)
Patient voices that she is hungry and able to eat now. RN apologized that she is currently NPO. Patient denied any nausea and no other vomiting episode during shift. Offer assistance with bath this AM, but patient verbalizes she is independently capable of bathing her self. No other distress or concerns voice. Will continue to monitor.

## 2020-02-24 NOTE — Progress Notes (Addendum)
PROGRESS NOTE        PATIENT DETAILS Name: Nancy [name] Age: 20 y.o. Sex: female Date of Birth: Sep 27, 2000 Admit Date: 02/21/2020 Admitting Physician Dewayne Shorter Levora Dredge, MD CWC:[id number], Dorma Russell, MD  Brief Narrative: Patient is a 20 y.o. female DM-2, HTN, seizure disorder, gastroparesis, cyclic vomiting, marijuana use-multiple ER visits-presenting with abdominal pain/vomiting. See below for further details.  Significant events: 2/15>> presented to ED with abdominal pain/vomiting-admitted for further eval/treatment.  Significant studies: 2/15>> chest x-ray: No active disease 2/16>> x-ray abdomen: No SBO   Antimicrobial therapy: None  Microbiology data: None  Procedures : None  Consults: GI  DVT Prophylaxis : enoxaparin (LOVENOX) injection 40 mg Start: 02/22/20 1000   Subjective: Continues to have some intermittent abdominal pain-no vomiting overnight-tolerated clear liquids-asking for regular food.  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  Extensive GI work-up in the past-including EGD/gastric emptying study have been negative.  Continue to slowly advance diet-change to full liquids this morning.  Avoid narcotics as much as possible.  Have counseled regarding importance of stopping marijuana completely.  Continue Reglan, erythromycin and other supportive care.  Discussed with pharmacy-we will try capsaicin cream for abdominal pain.  Constipation: No BM overnight-unresponsive to smog enema/Dulcolax suppository/MiraLAX/senna.  GI recommends that we continue Amitiza, MiraLAX and senna.  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  Asymptomatic bacteriuria: No role for antibiotics  HLD: Stable-continue statin  HTN: Continue propanolol  Hypokalemia: Replete and recheck.  History of seizures/pseudoseizures:  Stable-continue Lamictal and Topamax  History of anxiety/depression: Stable-continue Seroquel, Remeron and BuSpar.  DM-2 (A1c 11.7 on 11/25/2019): CBGs relatively stable-continue Lantus 45 units, SSI-as diet advances-we will slowly uptitrate her insulin regimen.  Recent Labs    02/23/20 2101 02/24/20 0556 02/24/20 1110  GLUCAP 187* 150* 321*    Obesity: Estimated body mass index is 34.19 kg/m as calculated from the following:   Height as of this encounter: 5\' 3"  (1.6 m).   Weight as of this encounter: 87.5 kg.     Diet: Diet Order            Diet full liquid Room service appropriate? Yes; Fluid consistency: Thin  Diet effective now                  Code Status: Full code   Family Communication: None at bedside  Disposition Plan: Status is: Inpatient   Dispo: The patient is from: Home              Anticipated d/c is to: Home              Anticipated d/c date is: 1 day              Patient currently is not medically stable to d/c.   Difficult to place patient No   Barriers to Discharge: Continues to have intractable abdominal pain/vomiting-back to n.p.o. today.  Antimicrobial agents: Anti-infectives (From admission, onward)   Start     Dose/Rate Route Frequency Ordered Stop   02/23/20 1700  erythromycin ethylsuccinate (EES) 200 MG/5ML suspension 200 mg        200 mg Oral 3 times daily before meals 02/23/20 1607         Time spent: 25 minutes-Greater than 50% of this time was spent in  counseling, explanation of diagnosis, planning of further management, and coordination of care.  MEDICATIONS: Scheduled Meds: . ARIPiprazole  10 mg Oral Daily  . atorvastatin  20 mg Oral Daily  . busPIRone  10 mg Oral BID  . capsaicin   Topical BID  . enoxaparin (LOVENOX) injection  40 mg Subcutaneous Daily  . erythromycin ethylsuccinate  200 mg Oral TID AC  . insulin aspart  0-9 Units Subcutaneous TID WC  . insulin glargine  45 Units Subcutaneous Daily  .  lamoTRIgine  50 mg Oral BID  . lubiprostone  24 mcg Oral BID WC  . metoCLOPramide (REGLAN) injection  5 mg Intravenous Q8H  . mirtazapine  7.5 mg Oral QHS  . ondansetron (ZOFRAN) IV  4 mg Intravenous TID AC  . pantoprazole  40 mg Oral Daily  . polyethylene glycol  17 g Oral Daily  . propranolol  20 mg Oral BID  . QUEtiapine  25 mg Oral q morning  . QUEtiapine  300 mg Oral QHS  . senna-docusate  2 tablet Oral QHS  . topiramate  25 mg Oral BID  . vitamin B-12  1,000 mcg Oral Daily   Continuous Infusions: . lactated ringers 100 mL/hr at 02/24/20 1205   PRN Meds:.acetaminophen **OR** acetaminophen, promethazine   PHYSICAL EXAM: Vital signs: Vitals:   02/23/20 2313 02/24/20 0511 02/24/20 0811 02/24/20 1244  BP: 112/69 (!) 120/92  (!) 152/109  Pulse: 88 92  (!) 107  Resp: 18 16  18   Temp: 98.7 F (37.1 C) 98.1 F (36.7 C)  98 F (36.7 C)  TempSrc: Oral Oral  Oral  SpO2: 100% 100%  98%  Weight:   87.5 kg   Height:   5\' 3"  (1.6 m)    Filed Weights   02/24/20 0811  Weight: 87.5 kg   Body mass index is 34.19 kg/m.   Gen Exam:Alert awake-not in any distress HEENT:atraumatic, normocephalic Chest: B/L clear to auscultation anteriorly CVS:S1S2 regular Abdomen:soft non tender, non distended Extremities:no edema Neurology: Non focal Skin: no rash  I have personally reviewed following labs and imaging studies  LABORATORY DATA: CBC: Recent Labs  Lab 02/19/20 0750 02/19/20 0755 02/19/20 0820 02/21/20 1944 02/21/20 2122 02/22/20 0600 02/24/20 0036  WBC 9.8  --   --  11.1*  --  10.3 7.1  NEUTROABS 7.9*  --   --   --   --   --   --   HGB 12.9   < > 14.3 13.7 14.3 11.5* 12.0  HCT 40.9   < > 42.0 43.0 42.0 36.8 35.6*  MCV 77.2*  --   --  77.5*  --  79.7* 75.9*  PLT 374  --   --  403*  --  302 305   < > = values in this interval not displayed.    Basic Metabolic Panel: Recent Labs  Lab 02/19/20 1346 02/21/20 1944 02/21/20 2110 02/21/20 2122 02/22/20 0600  02/23/20 0658 02/24/20 0036  NA 137 137  --  136 136 137 136  K 3.8 3.7  --  3.6 3.1* 3.3* 3.0*  CL 104 98  --   --  99 102 105  CO2 25 25  --   --  26 25 23   GLUCOSE 206* 327*  --   --  201* 236* 187*  BUN <5* 7  --   --  8 7 5*  CREATININE 0.54 0.71  --   --  0.53 0.67 0.58  CALCIUM 9.0 9.7  --   --  8.5* 8.8* 8.6*  MG  --   --  1.9  --   --  1.9 1.9    GFR: Estimated Creatinine Clearance: 116.6 mL/min (by C-G formula based on SCr of 0.58 mg/dL).  Liver Function Tests: Recent Labs  Lab 02/21/20 1944  AST 23  ALT 15  ALKPHOS 86  BILITOT 1.2  PROT 7.0  ALBUMIN 4.1   Recent Labs  Lab 02/21/20 1944  LIPASE 30   No results for input(s): AMMONIA in the last 168 hours.  Coagulation Profile: No results for input(s): INR, PROTIME in the last 168 hours.  Cardiac Enzymes: No results for input(s): CKTOTAL, CKMB, CKMBINDEX, TROPONINI in the last 168 hours.  BNP (last 3 results) No results for input(s): PROBNP in the last 8760 hours.  Lipid Profile: No results for input(s): CHOL, HDL, LDLCALC, TRIG, CHOLHDL, LDLDIRECT in the last 72 hours.  Thyroid Function Tests: No results for input(s): TSH, T4TOTAL, FREET4, T3FREE, THYROIDAB in the last 72 hours.  Anemia Panel: Recent Labs    02/24/20 1006 02/24/20 1026  FERRITIN  --  20  TIBC  --  337  IRON  --  58  RETICCTPCT 1.3  --     Urine analysis:    Component Value Date/Time   COLORURINE YELLOW 02/21/2020 2348   APPEARANCEUR HAZY (A) 02/21/2020 2348   LABSPEC 1.032 (H) 02/21/2020 2348   PHURINE 7.0 02/21/2020 2348   GLUCOSEU >=500 (A) 02/21/2020 2348   HGBUR SMALL (A) 02/21/2020 2348   BILIRUBINUR NEGATIVE 02/21/2020 2348   BILIRUBINUR negative 07/13/2018 1651   KETONESUR 80 (A) 02/21/2020 2348   PROTEINUR NEGATIVE 02/21/2020 2348   UROBILINOGEN 0.2 07/13/2018 1651   UROBILINOGEN 1.0 07/09/2017 1324   NITRITE NEGATIVE 02/21/2020 2348   LEUKOCYTESUR SMALL (A) 02/21/2020 2348    Sepsis Labs: Lactic Acid,  Venous    Component Value Date/Time   LATICACIDVEN 2.4 (HH) 08/23/2019 1020    MICROBIOLOGY: Recent Results (from the past 240 hour(s))  Culture, Urine     Status: Abnormal   Collection Time: 02/22/20  4:24 AM   Specimen: Urine, Random  Result Value Ref Range Status   Specimen Description URINE, RANDOM  Final   Special Requests   Final    NONE Performed at [facility], [address] [place], [place] [address]    Culture >=100,000 COLONIES/mL ESCHERICHIA COLI (A)  Final   Report Status 02/24/2020 FINAL  Final   Organism ID, Bacteria ESCHERICHIA COLI (A)  Final      Susceptibility   Escherichia coli - MIC*    AMPICILLIN >=32 RESISTANT Resistant     CEFAZOLIN <=4 SENSITIVE Sensitive     CEFEPIME <=0.12 SENSITIVE Sensitive     CEFTRIAXONE <=0.25 SENSITIVE Sensitive     CIPROFLOXACIN <=0.25 SENSITIVE Sensitive     GENTAMICIN <=1 SENSITIVE Sensitive     IMIPENEM <=0.25 SENSITIVE Sensitive     NITROFURANTOIN <=16 SENSITIVE Sensitive     TRIMETH/SULFA <=20 SENSITIVE Sensitive     AMPICILLIN/SULBACTAM >=32 RESISTANT Resistant     PIP/TAZO <=4 SENSITIVE Sensitive     * >=100,000 COLONIES/mL ESCHERICHIA COLI  SARS CORONAVIRUS 2 (TAT 6-24 HRS) Nasopharyngeal Nasopharyngeal Swab     Status: None   Collection Time: 02/22/20 10:38 AM   Specimen: Nasopharyngeal Swab  Result Value Ref Range Status   SARS Coronavirus 2 NEGATIVE NEGATIVE Final    Comment: (NOTE) SARS-CoV-2 target nucleic acids are NOT DETECTED.  The SARS-CoV-2 RNA is generally detectable in upper  and lower respiratory specimens during the acute phase of infection. Negative results do not preclude SARS-CoV-2 infection, do not rule out co-infections with other pathogens, and should not be used as the sole basis for treatment or other patient management decisions. Negative results must be combined with clinical observations, patient history, and epidemiological information. The expected result is Negative.  Fact  Sheet for Patients: HairSlick.no  Fact Sheet for Healthcare Providers: quierodirigir.com  This test is not yet approved or cleared by the [place] FDA and  has been authorized for detection and/or diagnosis of SARS-CoV-2 by FDA under an Emergency Use Authorization (EUA). This EUA will remain  in effect (meaning this test can be used) for the duration of the COVID-19 declaration under Se ction 564(b)(1) of the Act, 21 U.S.C. section 360bbb-3(b)(1), unless the authorization is terminated or revoked sooner.  Performed at [facility], [address], [place], [place] [address]     RADIOLOGY STUDIES/RESULTS: No results found.   LOS: 2 days   Jeoffrey Massed, MD  [facility]    To contact the attending provider between 7A-7P or the covering provider during after hours 7P-7A, please log into the web site [email or web address] and access using universal [place] password for that web site. If you do not have the password, please call the hospital operator.  02/24/2020, 2:18 PM

## 2020-02-24 NOTE — Progress Notes (Addendum)
Daily Rounding Note  02/24/2020, 9:42 AM  LOS: 2 days   SUBJECTIVE:   Chief complaint: Chronic nausea and vomiting.    No nausea so far this morning.  Has kept down clear liquid tray.  Passing flatus.  No stools.  OBJECTIVE:         Vital signs in last 24 hours:    Temp:  [98.1 F (36.7 C)-98.7 F (37.1 C)] 98.1 F (36.7 C) (02/18 0511) Pulse Rate:  [88-94] 92 (02/18 0511) Resp:  [16-20] 16 (02/18 0511) BP: (112-172)/(69-105) 120/92 (02/18 0511) SpO2:  [99 %-100 %] 100 % (02/18 0511) Weight:  [87.5 kg] 87.5 kg (02/18 0811) Last BM Date:  (1 month ago per pt. home enema 2/8 per pt report) Filed Weights   02/24/20 0811  Weight: 87.5 kg   General: Obese, resting comfortably.  Looks somewhat chronically ill/cushingoid. Heart: RRR. Chest: No labored breathing or cough Abdomen: Soft, bowel sounds absent.  Tender to minimal pressure palpation diffusely within abdomen Extremities: No CCE Neuro/Psych: Calm.  Oriented x3.  Moves all 4 limbs.  No tremors.  Intake/Output from previous day: 02/17 0701 - 02/18 0700 In: 2208.3 [P.O.:600; I.V.:1608.3] Out: 400 [Emesis/NG output:400]  Intake/Output this shift: No intake/output data recorded.  Lab Results: Recent Labs    02/21/20 1944 02/21/20 2122 02/22/20 0600 02/24/20 0036  WBC 11.1*  --  10.3 7.1  HGB 13.7 14.3 11.5* 12.0  HCT 43.0 42.0 36.8 35.6*  PLT 403*  --  302 305   BMET Recent Labs    02/22/20 0600 02/23/20 0658 02/24/20 0036  NA 136 137 136  K 3.1* 3.3* 3.0*  CL 99 102 105  CO2 26 25 23   GLUCOSE 201* 236* 187*  BUN 8 7 5*  CREATININE 0.53 0.67 0.58  CALCIUM 8.5* 8.8* 8.6*   LFT Recent Labs    02/21/20 1944  PROT 7.0  ALBUMIN 4.1  AST 23  ALT 15  ALKPHOS 86  BILITOT 1.2   PT/INR No results for input(s): LABPROT, INR in the last 72 hours. Hepatitis Panel No results for input(s): HEPBSAG, HCVAB, HEPAIGM, HEPBIGM in the last 72  hours.  Studies/Results: No results found.   Scheduled Meds: . ARIPiprazole  10 mg Oral Daily  . atorvastatin  20 mg Oral Daily  . busPIRone  10 mg Oral BID  . enoxaparin (LOVENOX) injection  40 mg Subcutaneous Daily  . erythromycin ethylsuccinate  200 mg Oral TID AC  . insulin aspart  0-9 Units Subcutaneous TID WC  . insulin glargine  45 Units Subcutaneous Daily  . lamoTRIgine  50 mg Oral BID  . lubiprostone  24 mcg Oral BID WC  . metoCLOPramide (REGLAN) injection  5 mg Intravenous Q8H  . mirtazapine  7.5 mg Oral QHS  . ondansetron (ZOFRAN) IV  4 mg Intravenous TID AC  . pantoprazole  40 mg Oral Daily  . polyethylene glycol  17 g Oral Daily  . propranolol  20 mg Oral BID  . QUEtiapine  25 mg Oral q morning  . QUEtiapine  300 mg Oral QHS  . senna-docusate  2 tablet Oral QHS  . topiramate  25 mg Oral BID  . vitamin B-12  1,000 mcg Oral Daily   Continuous Infusions: . lactated ringers 100 mL/hr at 02/24/20 0000  . potassium chloride 10 mEq (02/24/20 0929)   PRN Meds:.acetaminophen **OR** acetaminophen, promethazine   ASSESMENT:   *   Chronic recurrent nausea, vomiting,  abdominal pain.  Unremarkable EGD x 2, gastric emptying studies x 2.  UDS positive for THC,? cannabis related hyperemesis. Day two trial erythromycin. Day two IV Reglan. Last, single dose Morphine was 1430 2/17.  *    Constipation, colonic inertia/slow transit..  Reports two BMs monthly despite Amitiza bid, MiraLAX tid.  No results w SMOG enema 2/17.  No significant stool burden on KUB 2/16.  *   Depression, anxiety, conversion disorder, seizures versus pseudoseizures..  On multiple psych/seizure meds.  *     IDDM.  Frequent DKA.  Historically poorly controlled with A1c's 11-13 over previous 2 years.  Last A1c was 3 months ago.  *   Microcytosis w/o anemia.     PLAN   *   No plans for further GI evaluation at this time. *   Continue Reglan 5 mg tid ac, Zofran 4 mg tid ac and erythromycin 200mg  tid  ac. *   Continue current constipation regimen *   Would not administer additional narcotics. *   Iron studies.      02/24/2020, 9:42 AM Phone [phone]    Attending Physician Note   I have taken an interval history, reviewed the chart and examined the patient. I agree with the Advanced Practitioner's note, impression and recommendations.   * Advance diet slowly as tolerated. Can be discharged on full liquids if tolerated   * Stop all THC use * Attempt to avoid narcotics * Continue Reglan 5 mg tid ac, Zofran 4 mg tid ac and erythromycin 200mg  tid ac in hospital and as outpatient * Continue Amitiza, Miralax and Senokot-S for constipation in hospital and as outpatient * Continue pantoprazole 40 mg qd in hospital and as outpatient * Outpatient GI follow up with Dr. [phone], MD [facility] [phone]

## 2020-02-25 DIAGNOSIS — R112 Nausea with vomiting, unspecified: Secondary | ICD-10-CM | POA: Diagnosis not present

## 2020-02-25 DIAGNOSIS — R1084 Generalized abdominal pain: Secondary | ICD-10-CM | POA: Diagnosis not present

## 2020-02-25 LAB — MAGNESIUM: Magnesium: 2.1 mg/dL (ref 1.7–2.4)

## 2020-02-25 LAB — BASIC METABOLIC PANEL
Anion gap: 9 (ref 5–15)
BUN: <5 mg/dL — ABNORMAL LOW (ref 6–20)
CO2: 24 mmol/L (ref 22–32)
Calcium: 8.6 mg/dL — ABNORMAL LOW (ref 8.9–10.3)
Chloride: 107 mmol/L (ref 98–111)
Creatinine, Ser: 0.63 mg/dL (ref 0.44–1.00)
GFR, Estimated: >60 mL/min (ref >60)
Glucose, Bld: 217 mg/dL — ABNORMAL HIGH (ref 70–99)
Potassium: 3.2 mmol/L — ABNORMAL LOW (ref 3.5–5.1)
Sodium: 140 mmol/L (ref 135–145)

## 2020-02-25 LAB — GLUCOSE, CAPILLARY
Glucose-Capillary: 140 mg/dL — ABNORMAL HIGH (ref 70–99)
Glucose-Capillary: 174 mg/dL — ABNORMAL HIGH (ref 70–99)
Glucose-Capillary: 130 mg/dL — ABNORMAL HIGH (ref 70–99)
Glucose-Capillary: 118 mg/dL — ABNORMAL HIGH (ref 70–99)

## 2020-02-25 MED ORDER — ONDANSETRON 4 MG PO TBDP
4.0000 mg | ORAL_TABLET | Freq: Once | ORAL | Status: AC
  Administered 2020-02-25: 4 mg via ORAL
  Filled 2020-02-25: qty 1

## 2020-02-25 MED ORDER — PROMETHAZINE HCL 25 MG/ML IJ SOLN
12.5000 mg | Freq: Four times a day (QID) | INTRAMUSCULAR | Status: DC | PRN
  Administered 2020-02-25 – 2020-02-29 (×8): 12.5 mg via INTRAVENOUS
  Filled 2020-02-25 (×8): qty 1

## 2020-02-25 MED ORDER — POTASSIUM CHLORIDE CRYS ER 20 MEQ PO TBCR
40.0000 meq | EXTENDED_RELEASE_TABLET | Freq: Once | ORAL | Status: AC
  Administered 2020-02-25: 40 meq via ORAL
  Filled 2020-02-25: qty 2

## 2020-02-25 MED ORDER — POLYETHYLENE GLYCOL 3350 17 G PO PACK
17.0000 g | PACK | Freq: Two times a day (BID) | ORAL | Status: DC
  Administered 2020-02-25 – 2020-02-29 (×9): 17 g via ORAL
  Filled 2020-02-25 (×10): qty 1

## 2020-02-25 MED ORDER — KCL-LACTATED RINGERS 20 MEQ/L IV SOLN
INTRAVENOUS | Status: DC
  Filled 2020-02-25: qty 1000

## 2020-02-25 MED ORDER — ONDANSETRON 4 MG PO TBDP: 4.0000 mg | ORAL_TABLET | Freq: Once | ORAL | Status: AC

## 2020-02-25 MED ORDER — SODIUM CHLORIDE 0.9 % IV SOLN
250.0000 mg | Freq: Three times a day (TID) | INTRAVENOUS | Status: DC
  Administered 2020-02-25 – 2020-02-29 (×11): 250 mg via INTRAVENOUS
  Filled 2020-02-25 (×14): qty 5

## 2020-02-25 MED ORDER — POTASSIUM CHLORIDE 2 MEQ/ML IV SOLN
INTRAVENOUS | Status: DC
  Filled 2020-02-25 (×6): qty 1000

## 2020-02-25 NOTE — Progress Notes (Addendum)
Progress Note  Chief Complaint:    Nausea, vomiting and abdominal pain      ASSESSMENT / PLAN:    # Acute on chronic nausea, vomiting and abdominal pain . Unrevealing extensive GI workup over last few years.  --Not improving on scheduled IV Reglan, IV Zofran and oral Erythromycin. Says she has vomited twice this am and emesis contains last night's dinner. There is a small amount of emesis in bad which is clear with scant amount of debris. --Not getting narcotics.  --Elavil can be helpful in patient's with chronic nausea and vomiting.  After recover from acute episode Dr. Adela Lank may consider it. However, it may possibly interfere with some of her other home medications.   --Staff working on inserting another IV, current one has infiltrated. Will give one time dose of ODT Zofran pending IV access   # Chronic constipation. Patient says she averages 2 BMs a month taking TID miralax and BID Amitiza. She failed Motegrity. Her last BM was in January. No results from [facility] this am. Probably has slow transit constipation. KUB two days ago does not show a significant stool burden --Continue BID Miralax, BID Amitiza, Senokot-S     Attending Physician Note   I have taken an interval history, reviewed the chart and examined the patient. I agree with the Advanced Practitioner's note, impression and recommendations. We will plan to follow up with her again on Monday. Please call for questions.  Claudette Head, MD FACG [phone]    SUBJECTIVE:   Two episodes of nausea / vomiting this am.     OBJECTIVE:     Scheduled inpatient medications:  . ARIPiprazole  10 mg Oral Daily  . atorvastatin  20 mg Oral Daily  . busPIRone  10 mg Oral BID  . capsaicin   Topical BID  . enoxaparin (LOVENOX) injection  40 mg Subcutaneous Daily  . erythromycin ethylsuccinate  200 mg Oral TID AC  . insulin aspart  0-9 Units Subcutaneous TID WC  . insulin glargine  45 Units Subcutaneous Daily  .  lamoTRIgine  50 mg Oral BID  . lubiprostone  24 mcg Oral BID WC  . metoCLOPramide (REGLAN) injection  5 mg Intravenous Q8H  . mirtazapine  7.5 mg Oral QHS  . ondansetron (ZOFRAN) IV  4 mg Intravenous TID AC  . pantoprazole  40 mg Oral Daily  . polyethylene glycol  17 g Oral BID  . propranolol  20 mg Oral BID  . QUEtiapine  25 mg Oral q morning  . QUEtiapine  300 mg Oral QHS  . senna-docusate  2 tablet Oral QHS  . topiramate  25 mg Oral BID  . vitamin B-12  1,000 mcg Oral Daily   Continuous inpatient infusions:  . lactated ringers 1,000 mL with potassium chloride 20 mEq infusion 75 mL/hr at 02/25/20 1049   PRN inpatient medications: acetaminophen **OR** acetaminophen, promethazine  Vital signs in last 24 hours: Temp:  [98 F (36.7 C)-98.7 F (37.1 C)] 98 F (36.7 C) (02/19 1219) Pulse Rate:  [89-99] 99 (02/19 1219) Resp:  [16-19] 16 (02/19 1219) BP: (128-151)/(85-100) 148/92 (02/19 1219) SpO2:  [97 %-100 %] 100 % (02/19 1219) Last BM Date:  (1 month ago per pt. home enema 2/8 per pt report)  Intake/Output Summary (Last 24 hours) at 02/25/2020 1313 Last data filed at 02/24/2020 1504 Gross per 24 hour  Intake 758.81 ml  Output --  Net 758.81 ml     Physical Exam:  .  General: Alert female in NAD. She was more talkative today than when I saw her two days ago in consultation.  Marland Kitchen Heart:  Regular rate and rhythm. No lower extremity edema . Pulmonary: Normal respiratory effort . Abdomen: Soft, nondistended, nontender. Normal bowel sounds.  . Neurologic: Alert and oriented . Psych: Pleasant. Cooperative.   Filed Weights   02/24/20 0811  Weight: 87.5 kg    Intake/Output from previous day: 02/18 0701 - 02/19 0700 In: 758.8 [I.V.:758.8] Out: -  Intake/Output this shift: No intake/output data recorded.    Lab Results: Recent Labs    02/24/20 0036  WBC 7.1  HGB 12.0  HCT 35.6*  PLT 305   BMET Recent Labs    02/23/20 0658 02/24/20 0036 02/25/20 0317  NA 137  136 140  K 3.3* 3.0* 3.2*  CL 102 105 107  CO2 25 23 24   GLUCOSE 236* 187* 217*  BUN 7 5* <5*  CREATININE 0.67 0.58 0.63  CALCIUM 8.8* 8.6* 8.6*      Principal Problem:   Intractable nausea and vomiting Active Problems:   Chest pain   Hyperglycemia   Abdominal pain   Chronic constipation   Nausea & vomiting     LOS: 3 days   ,NP 02/25/2020, 1:13 PM

## 2020-02-25 NOTE — Progress Notes (Signed)
Called and spoke to [facility] regarding erythromycin peripheral iv infiltrate. No medical tx available for infiltration. This rn applied warm pack to site.

## 2020-02-25 NOTE — Progress Notes (Signed)
PROGRESS NOTE        PATIENT DETAILS Name: Nancy [name] Age: 20 y.o. Sex: female Date of Birth: [birth year] Admit Date: 02/21/2020 Admitting Physician Dewayne Shorter Levora Dredge, MD [id number], Dorma Russell, MD  Brief Narrative: Patient is a 20 y.o. female DM-2, HTN, seizure disorder, gastroparesis, cyclic vomiting, marijuana use-multiple ER visits-presenting with abdominal pain/vomiting. See below for further details.  Significant events: 2/15>> presented to ED with abdominal pain/vomiting-admitted for further eval/treatment.  Significant studies: 2/15>> chest x-ray: No active disease 2/16>> x-ray abdomen: No SBO   Antimicrobial therapy: None  Microbiology data: None  Procedures : None  Consults: GI  DVT Prophylaxis : enoxaparin (LOVENOX) injection 40 mg Start: 02/22/20 1000   Subjective: Having severe abdominal pain this morning when I saw her. Apparently vomited around five times overnight. Claims that the pain comes and goes-last about 30-45 minutes. P.m. she has not been able to tolerate any oral intake since yesterday.  Assessment/Plan: Intractable nausea/vomiting with acute on chronic abdominal pain: Appears unchanged-due to ongoing vomiting-we will switch back to n.p.o. status-continue IV fluids. 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  Extensive GI work-up in the past-including EGD/gastric emptying study have been negative. Continue to avoid narcotics as much as possible. Extensive counseling done regarding importance of stopping marijuana use completely. Remains on Reglan/erythromycin and other supportive care.  Constipation: Claims to have no BM since admission--unresponsive to smog enema/Dulcolax suppository/MiraLAX/senna.  GI recommends that we continue Amitiza, MiraLAX and senna.  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  Asymptomatic bacteriuria: No role for  antibiotics  HLD: Stable-continue statin  HTN: Continue propanolol  Hypokalemia: Replete and recheck.  History of seizures/pseudoseizures: Stable-continue Lamictal and Topamax  History of anxiety/depression: Stable-continue Seroquel, Remeron and BuSpar.  DM-2 (A1c 11.7 on 11/25/2019): CBGs relatively stable-continue Lantus 45 units, SSI-as diet advances-we will slowly uptitrate her insulin regimen.  Recent Labs    02/24/20 1559 02/24/20 2104 02/25/20 0606  GLUCAP 169* 141* 140*    Obesity: Estimated body mass index is 34.19 kg/m as calculated from the following:   Height as of this encounter: 5\' 3"  (1.6 m).   Weight as of this encounter: 87.5 kg.     Diet: Diet Order            Diet NPO time specified Except for: Sips with Meds, Ice Chips  Diet effective now                  Code Status: Full code   Family Communication: None at bedside  Disposition Plan: Status is: Inpatient   Dispo: The patient is from: Home              Anticipated d/c is to: Home              Anticipated d/c date is: 1 day              Patient currently is not medically stable to d/c.   Difficult to place patient No   Barriers to Discharge: Continues to have intractable abdominal pain/vomiting-back to n.p.o. today.  Antimicrobial agents: Anti-infectives (From admission, onward)   Start     Dose/Rate Route Frequency Ordered Stop   02/23/20 1700  erythromycin ethylsuccinate (EES) 200 MG/5ML suspension 200 mg        200 mg Oral 3 times daily  before meals 02/23/20 1607         Time spent: 25 minutes-Greater than 50% of this time was spent in counseling, explanation of diagnosis, planning of further management, and coordination of care.  MEDICATIONS: Scheduled Meds: . ARIPiprazole  10 mg Oral Daily  . atorvastatin  20 mg Oral Daily  . busPIRone  10 mg Oral BID  . capsaicin   Topical BID  . enoxaparin (LOVENOX) injection  40 mg Subcutaneous Daily  . erythromycin  ethylsuccinate  200 mg Oral TID AC  . insulin aspart  0-9 Units Subcutaneous TID WC  . insulin glargine  45 Units Subcutaneous Daily  . lamoTRIgine  50 mg Oral BID  . lubiprostone  24 mcg Oral BID WC  . metoCLOPramide (REGLAN) injection  5 mg Intravenous Q8H  . mirtazapine  7.5 mg Oral QHS  . ondansetron (ZOFRAN) IV  4 mg Intravenous TID AC  . pantoprazole  40 mg Oral Daily  . polyethylene glycol  17 g Oral BID  . potassium chloride  40 mEq Oral Once  . propranolol  20 mg Oral BID  . QUEtiapine  25 mg Oral q morning  . QUEtiapine  300 mg Oral QHS  . senna-docusate  2 tablet Oral QHS  . topiramate  25 mg Oral BID  . vitamin B-12  1,000 mcg Oral Daily   Continuous Infusions: . lactated ringers 1,000 mL with potassium chloride 20 mEq infusion     PRN Meds:.acetaminophen **OR** acetaminophen, promethazine   PHYSICAL EXAM: Vital signs: Vitals:   02/24/20 1244 02/24/20 1651 02/24/20 2327 02/25/20 0505  BP: (!) 152/109 (!) 128/93 131/85 (!) 151/100  Pulse: (!) 107 89 97 96  Resp: 18 18 18 19   Temp: 98 F (36.7 C) 98.7 F (37.1 C) 98.7 F (37.1 C) 98.6 F (37 C)  TempSrc: Oral Oral Oral Oral  SpO2: 98% 98% 97% 100%  Weight:      Height:       Filed Weights   02/24/20 0811  Weight: 87.5 kg   Body mass index is 34.19 kg/m.   Gen Exam:Alert awake-not in any distress HEENT:atraumatic, normocephalic Chest: B/L clear to auscultation anteriorly CVS:S1S2 regular Abdomen:soft non tender, non distended Extremities:no edema Neurology: Non focal Skin: no rash  I have personally reviewed following labs and imaging studies  LABORATORY DATA: CBC: Recent Labs  Lab 02/19/20 0750 02/19/20 0755 02/19/20 0820 02/21/20 1944 02/21/20 2122 02/22/20 0600 02/24/20 0036  WBC 9.8  --   --  11.1*  --  10.3 7.1  NEUTROABS 7.9*  --   --   --   --   --   --   HGB 12.9   < > 14.3 13.7 14.3 11.5* 12.0  HCT 40.9   < > 42.0 43.0 42.0 36.8 35.6*  MCV 77.2*  --   --  77.5*  --  79.7*  75.9*  PLT 374  --   --  403*  --  302 305   < > = values in this interval not displayed.    Basic Metabolic Panel: Recent Labs  Lab 02/21/20 1944 02/21/20 2110 02/21/20 2122 02/22/20 0600 02/23/20 0658 02/24/20 0036 02/25/20 0317  NA 137  --  136 136 137 136 140  K 3.7  --  3.6 3.1* 3.3* 3.0* 3.2*  CL 98  --   --  99 102 105 107  CO2 25  --   --  26 25 23 24   GLUCOSE 327*  --   --  201* 236* 187* 217*  BUN 7  --   --  8 7 5* <5*  CREATININE 0.71  --   --  0.53 0.67 0.58 0.63  CALCIUM 9.7  --   --  8.5* 8.8* 8.6* 8.6*  MG  --  1.9  --   --  1.9 1.9 2.1    GFR: Estimated Creatinine Clearance: 116.6 mL/min (by C-G formula based on SCr of 0.63 mg/dL).  Liver Function Tests: Recent Labs  Lab 02/21/20 1944  AST 23  ALT 15  ALKPHOS 86  BILITOT 1.2  PROT 7.0  ALBUMIN 4.1   Recent Labs  Lab 02/21/20 1944  LIPASE 30   No results for input(s): AMMONIA in the last 168 hours.  Coagulation Profile: No results for input(s): INR, PROTIME in the last 168 hours.  Cardiac Enzymes: No results for input(s): CKTOTAL, CKMB, CKMBINDEX, TROPONINI in the last 168 hours.  BNP (last 3 results) No results for input(s): PROBNP in the last 8760 hours.  Lipid Profile: No results for input(s): CHOL, HDL, LDLCALC, TRIG, CHOLHDL, LDLDIRECT in the last 72 hours.  Thyroid Function Tests: No results for input(s): TSH, T4TOTAL, FREET4, T3FREE, THYROIDAB in the last 72 hours.  Anemia Panel: Recent Labs    02/24/20 1006 02/24/20 1026  FERRITIN  --  20  TIBC  --  337  IRON  --  58  RETICCTPCT 1.3  --     Urine analysis:    Component Value Date/Time   COLORURINE YELLOW 02/21/2020 2348   APPEARANCEUR HAZY (A) 02/21/2020 2348   LABSPEC 1.032 (H) 02/21/2020 2348   PHURINE 7.0 02/21/2020 2348   GLUCOSEU >=500 (A) 02/21/2020 2348   HGBUR SMALL (A) 02/21/2020 2348   BILIRUBINUR NEGATIVE 02/21/2020 2348   BILIRUBINUR negative 07/13/2018 1651   KETONESUR 80 (A) 02/21/2020 2348    PROTEINUR NEGATIVE 02/21/2020 2348   UROBILINOGEN 0.2 07/13/2018 1651   UROBILINOGEN 1.0 07/09/2017 1324   NITRITE NEGATIVE 02/21/2020 2348   LEUKOCYTESUR SMALL (A) 02/21/2020 2348    Sepsis Labs: Lactic Acid, Venous    Component Value Date/Time   LATICACIDVEN 2.4 (HH) 08/23/2019 1020    MICROBIOLOGY: Recent Results (from the past 240 hour(s))  Culture, Urine     Status: Abnormal   Collection Time: 02/22/20  4:24 AM   Specimen: Urine, Random  Result Value Ref Range Status   Specimen Description URINE, RANDOM  Final   Special Requests   Final    NONE Performed at [facility], [address] [place], [place] [address]    Culture >=100,000 COLONIES/mL ESCHERICHIA COLI (A)  Final   Report Status 02/24/2020 FINAL  Final   Organism ID, Bacteria ESCHERICHIA COLI (A)  Final      Susceptibility   Escherichia coli - MIC*    AMPICILLIN >=32 RESISTANT Resistant     CEFAZOLIN <=4 SENSITIVE Sensitive     CEFEPIME <=0.12 SENSITIVE Sensitive     CEFTRIAXONE <=0.25 SENSITIVE Sensitive     CIPROFLOXACIN <=0.25 SENSITIVE Sensitive     GENTAMICIN <=1 SENSITIVE Sensitive     IMIPENEM <=0.25 SENSITIVE Sensitive     NITROFURANTOIN <=16 SENSITIVE Sensitive     TRIMETH/SULFA <=20 SENSITIVE Sensitive     AMPICILLIN/SULBACTAM >=32 RESISTANT Resistant     PIP/TAZO <=4 SENSITIVE Sensitive     * >=100,000 COLONIES/mL ESCHERICHIA COLI  SARS CORONAVIRUS 2 (TAT 6-24 HRS) Nasopharyngeal Nasopharyngeal Swab     Status: None   Collection Time: 02/22/20 10:38 AM  Specimen: Nasopharyngeal Swab  Result Value Ref Range Status   SARS Coronavirus 2 NEGATIVE NEGATIVE Final    Comment: (NOTE) SARS-CoV-2 target nucleic acids are NOT DETECTED.  The SARS-CoV-2 RNA is generally detectable in upper and lower respiratory specimens during the acute phase of infection. Negative results do not preclude SARS-CoV-2 infection, do not rule out co-infections with other pathogens, and should not be used as  the sole basis for treatment or other patient management decisions. Negative results must be combined with clinical observations, patient history, and epidemiological information. The expected result is Negative.  Fact Sheet for Patients: HairSlick.no  Fact Sheet for Healthcare Providers: quierodirigir.com  This test is not yet approved or cleared by the [place] FDA and  has been authorized for detection and/or diagnosis of SARS-CoV-2 by FDA under an Emergency Use Authorization (EUA). This EUA will remain  in effect (meaning this test can be used) for the duration of the COVID-19 declaration under Se ction 564(b)(1) of the Act, 21 U.S.C. section 360bbb-3(b)(1), unless the authorization is terminated or revoked sooner.  Performed at [facility], [address], [place], [place] [address]     RADIOLOGY STUDIES/RESULTS: No results found.   LOS: 3 days   Jeoffrey Massed, MD  [facility]    To contact the attending provider between 7A-7P or the covering provider during after hours 7P-7A, please log into the web site [email or web address] and access using universal [place] password for that web site. If you do not have the password, please call the hospital operator.  02/25/2020, 9:22 AM

## 2020-02-26 DIAGNOSIS — R112 Nausea with vomiting, unspecified: Secondary | ICD-10-CM | POA: Diagnosis not present

## 2020-02-26 DIAGNOSIS — R1084 Generalized abdominal pain: Secondary | ICD-10-CM | POA: Diagnosis not present

## 2020-02-26 LAB — GLUCOSE, CAPILLARY
Glucose-Capillary: 86 mg/dL (ref 70–99)
Glucose-Capillary: 67 mg/dL — ABNORMAL LOW (ref 70–99)
Glucose-Capillary: 151 mg/dL — ABNORMAL HIGH (ref 70–99)
Glucose-Capillary: 31 mg/dL — CL (ref 70–99)
Glucose-Capillary: 144 mg/dL — ABNORMAL HIGH (ref 70–99)
Glucose-Capillary: 87 mg/dL (ref 70–99)
Glucose-Capillary: 41 mg/dL — CL (ref 70–99)
Glucose-Capillary: 38 mg/dL — CL (ref 70–99)
Glucose-Capillary: 106 mg/dL — ABNORMAL HIGH (ref 70–99)
Glucose-Capillary: 122 mg/dL — ABNORMAL HIGH (ref 70–99)

## 2020-02-26 IMAGING — CT CT ABD-PELV W/ CM
2 of 4 series · 16 of 46 positions shown, 18 images · IV contrast (APPLIED)
Comparison: CT dated December 19, 2018.

CLINICAL DATA: Abdominal pain and hematemesis.

EXAM:
CT ABDOMEN AND PELVIS WITH CONTRAST
TECHNIQUE: Multidetector CT imaging of the abdomen and pelvis was performed
using the standard protocol following bolus administration of
intravenous contrast.
CONTRAST:  100mL OMNIPAQUE IOHEXOL 300 MG/ML  SOLN

[Series 3: abd/ pelvis 5.0 i30f 2 · axial · 0.74mm/px · z∈[-288,+157]mm · 13 of 97 slices shown, 15 images]
[im 4/97  soft-tissue]
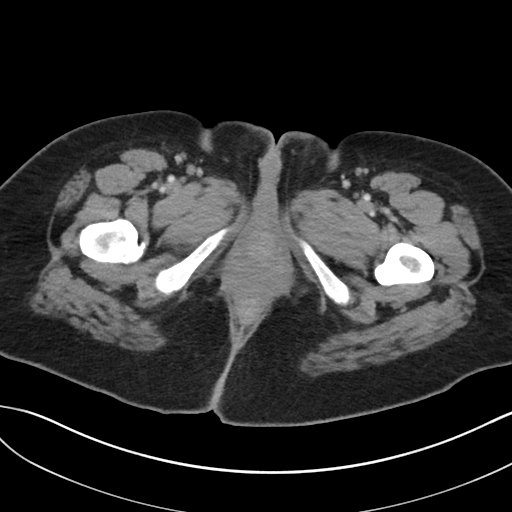
[im 4/97  bone]
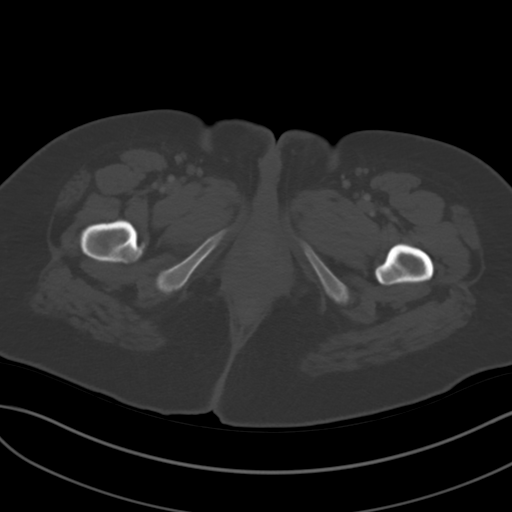
[im 12/97  soft-tissue]
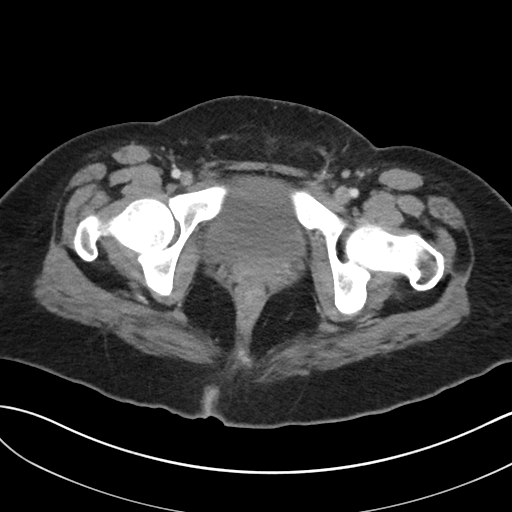
[im 20/97  soft-tissue]
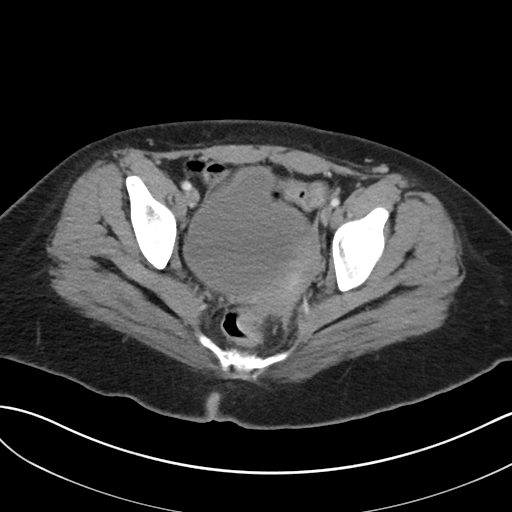
[im 27/97  soft-tissue]
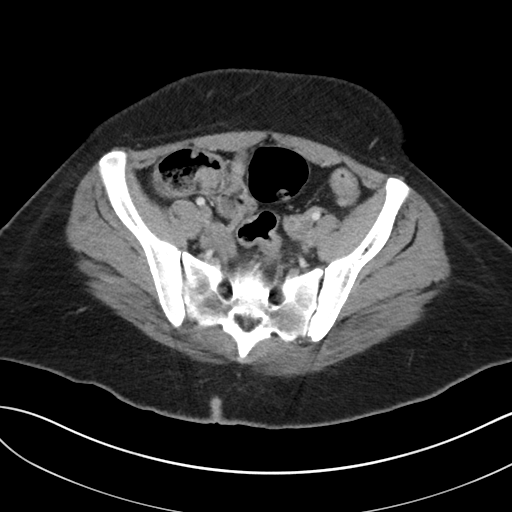
[im 35/97  soft-tissue]
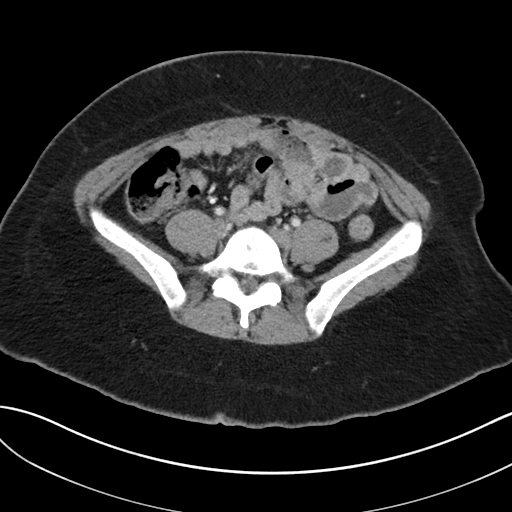
[im 43/97  soft-tissue]
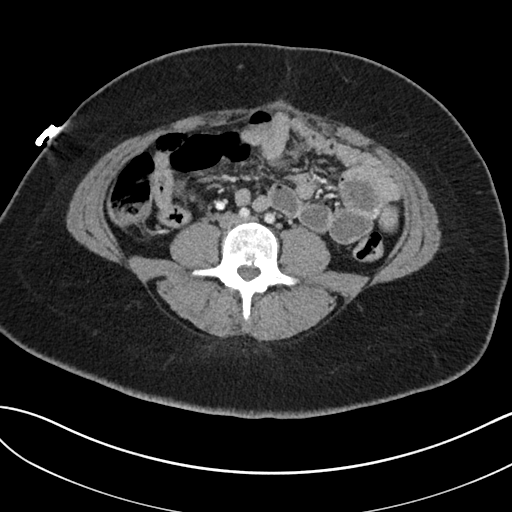
[im 50/97  soft-tissue]
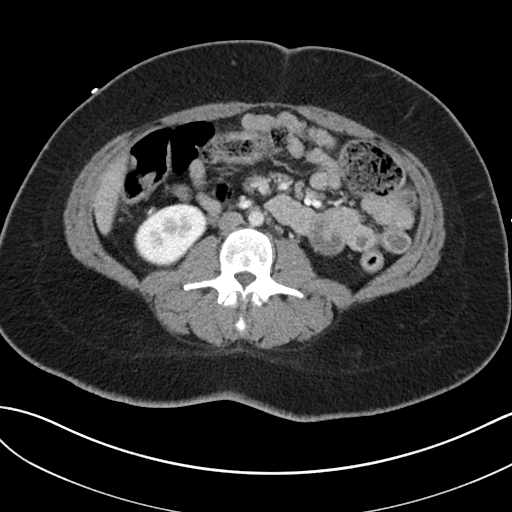
[im 54/97  soft-tissue]
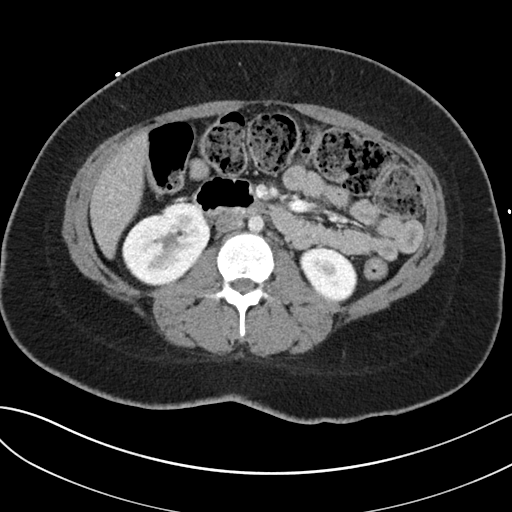
[im 62/97  soft-tissue]
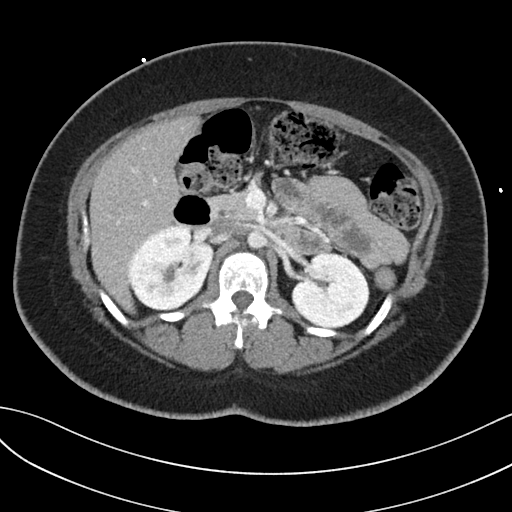
[im 62/97  bone]
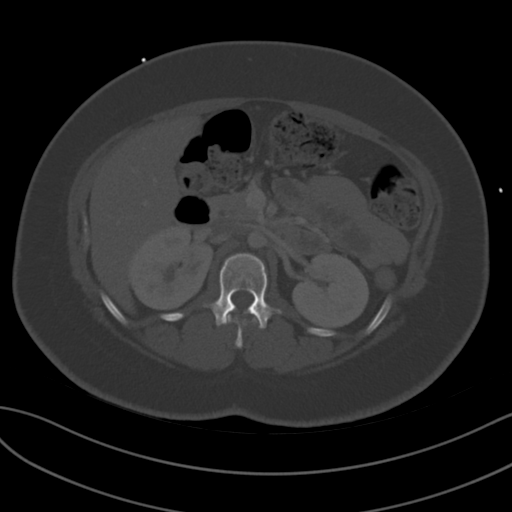
[im 70/97  soft-tissue]
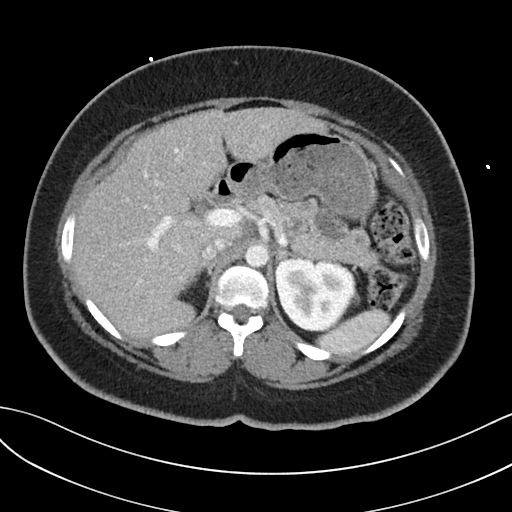
[im 77/97  soft-tissue]
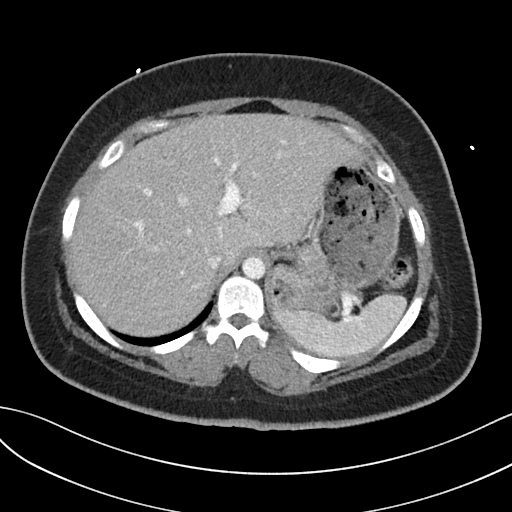
[im 85/97  soft-tissue]
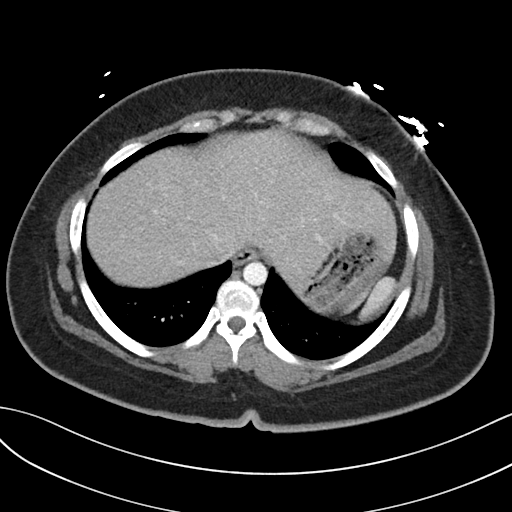
[im 93/97  soft-tissue]
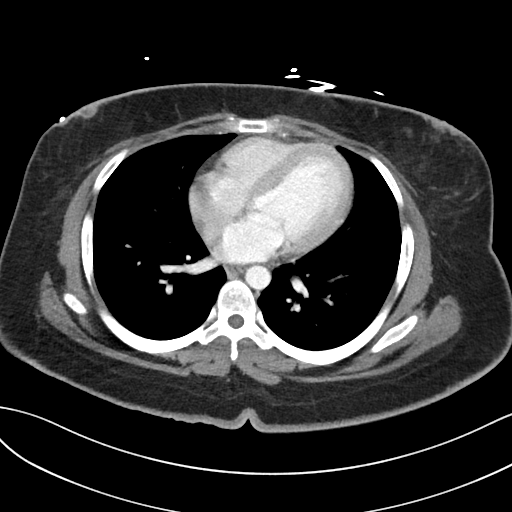

[Series 6: coronal soft tissue · coronal · 0.87mm/px · 3 of 98 slices shown]
[im 33/98  soft-tissue]
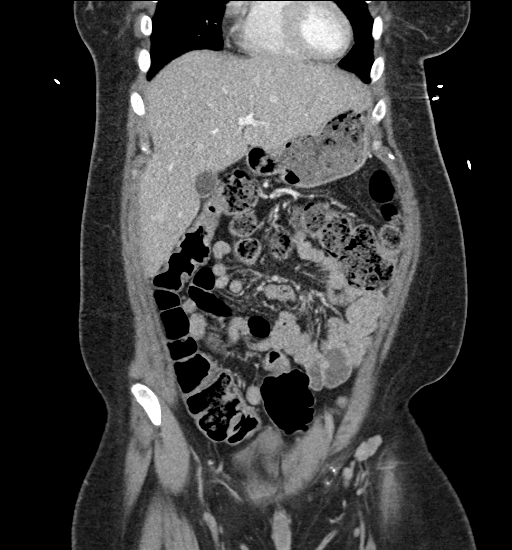
[im 44/98  soft-tissue]
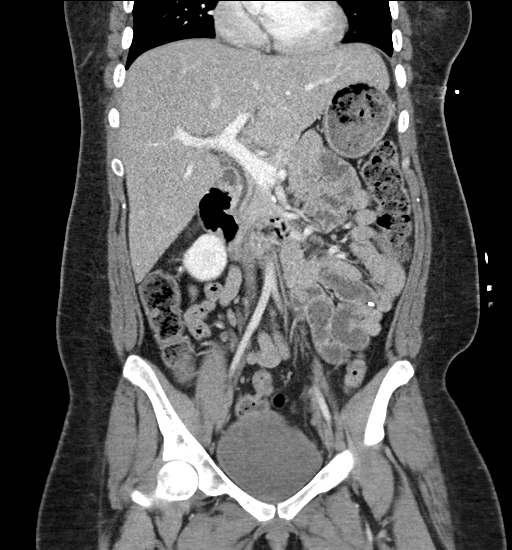
[im 54/98  soft-tissue]
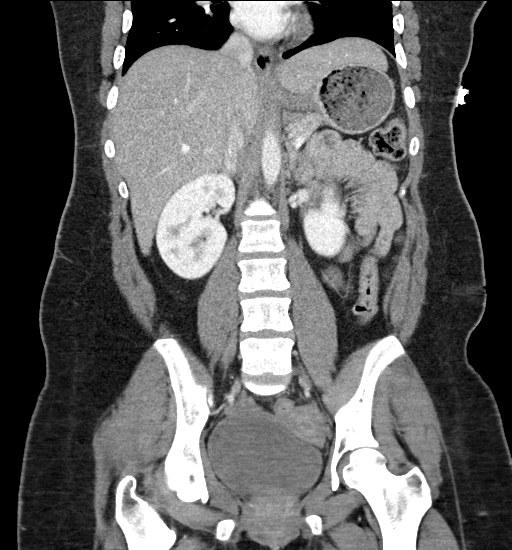

[16 of 46 positions shown; findings below may reference images not displayed]

FINDINGS: Lower chest: The lung bases are clear. The heart size is normal.

Hepatobiliary: The liver is normal. Normal gallbladder.There is no
biliary ductal dilation.

Pancreas: Normal contours without ductal dilatation. No
peripancreatic fluid collection.

Spleen: No splenic laceration or hematoma.

Adrenals/Urinary Tract:

--Adrenal glands: No adrenal hemorrhage.

--Right kidney/ureter: No hydronephrosis or perinephric hematoma.

--Left kidney/ureter: No hydronephrosis or perinephric hematoma.

--Urinary bladder: Unremarkable.

Stomach/Bowel:

--Stomach/Duodenum: No hiatal hernia or other gastric abnormality.
Normal duodenal course and caliber.

--Small bowel: No dilatation or inflammation.

--Colon: No focal abnormality.

--Appendix: Normal.

Vascular/Lymphatic: Normal course and caliber of the major abdominal
vessels.

--No retroperitoneal lymphadenopathy.

--No mesenteric lymphadenopathy.

--No pelvic or inguinal lymphadenopathy.

Reproductive: Superior to the left labia majora there is an area of
skin thickening and subcutaneous fat induration (axial series 3,
image 91).

Other: No ascites or free air. There is a new nodule within the
subcutaneous fat of the anterior abdominal wall on the right
measuring approximately 1.5 cm (axial series 3, image 65). This is
of doubtful clinical significance and may represent an injection
granuloma or small subcutaneous hematoma.

Musculoskeletal. No acute displaced fractures.
IMPRESSION: 1. No CT evidence of acute intra-abdominal or intrapelvic
abnormalities.
2. There is an area of skin thickening and subcutaneous fat
stranding superior to the left labia majora. Findings favored to
represent a small soft tissue infection. Clinical correlation is
recommended.

## 2020-02-26 IMAGING — DX DG CHEST 1V PORT
1 series · 1 of 1 positions shown · non-contrast
Comparison: 01/27/2019

CLINICAL DATA: Chest pain. Seizure.

EXAM:
PORTABLE CHEST 1 VIEW

[chest ap]
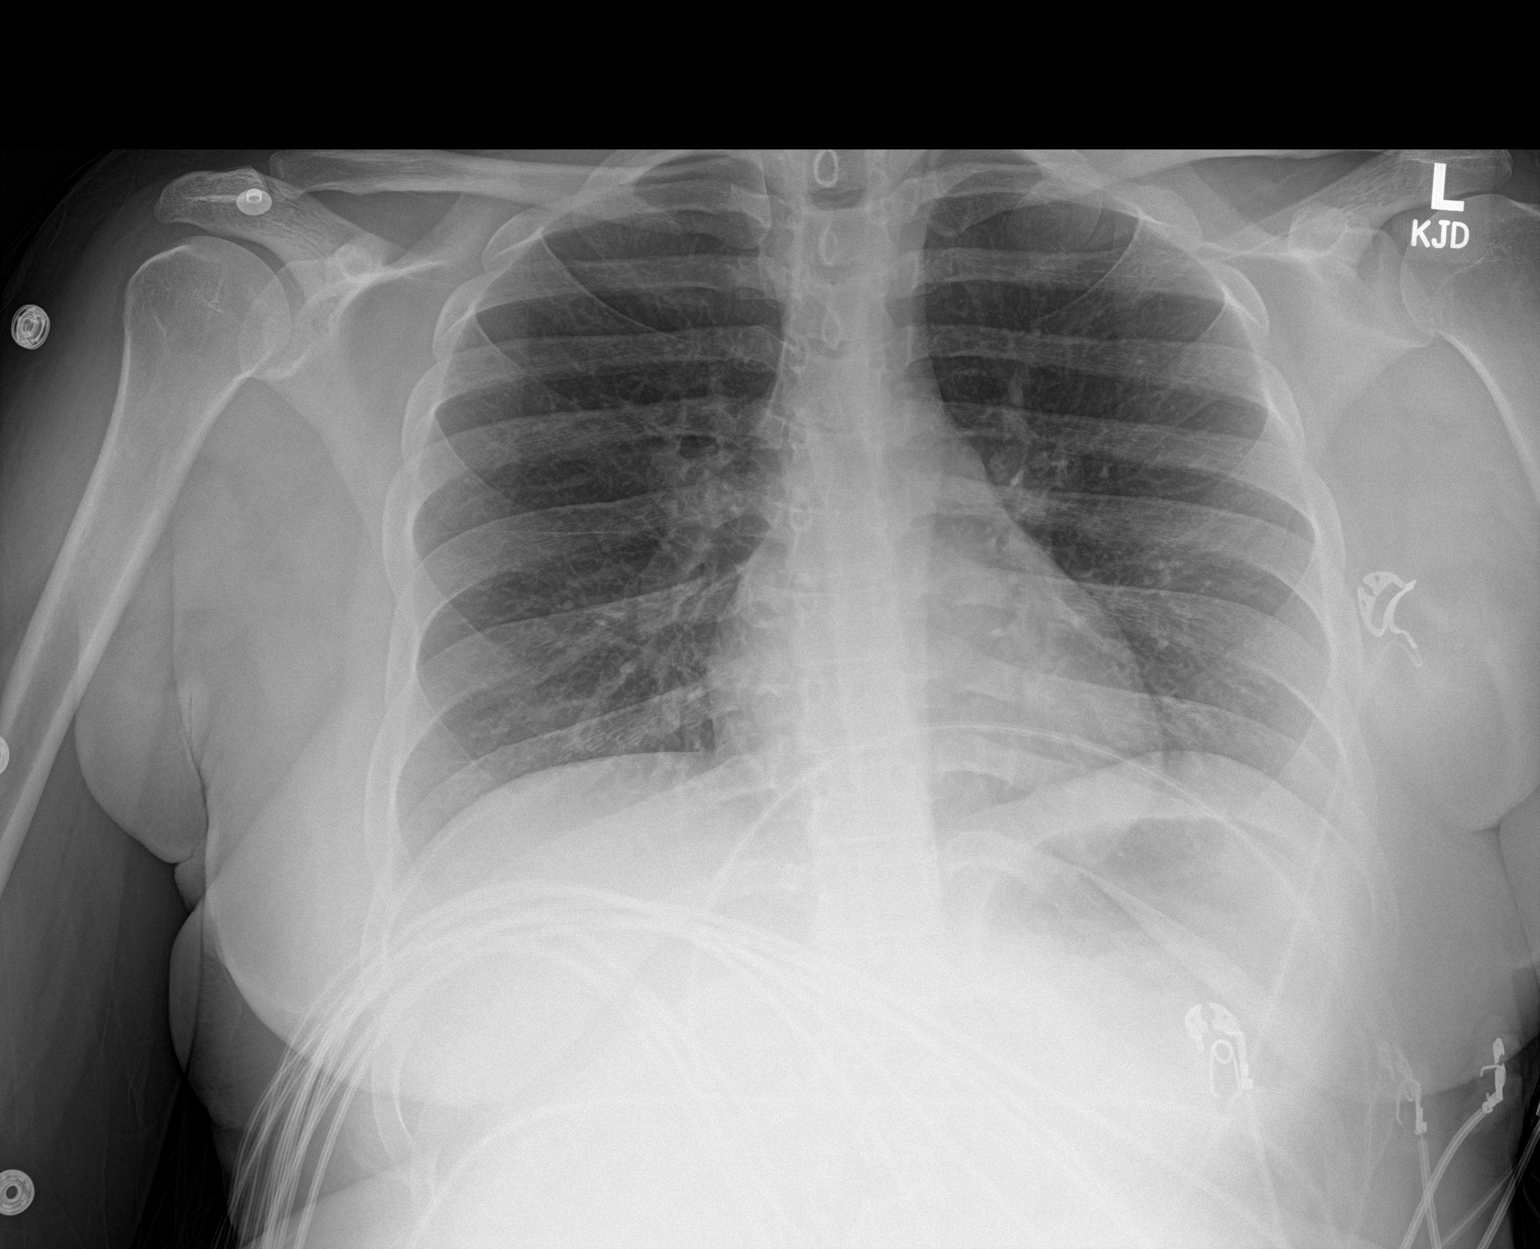

[1 of 1 positions shown; findings below may reference images not displayed]

FINDINGS: The cardiomediastinal contours are normal. The lungs are clear.
Pulmonary vasculature is normal. No consolidation, pleural effusion,
or pneumothorax. No acute osseous abnormalities are seen.
IMPRESSION: Negative chest radiograph.

## 2020-02-26 MED ORDER — INSULIN GLARGINE 100 UNIT/ML ~~LOC~~ SOLN
35.0000 [IU] | Freq: Every day | SUBCUTANEOUS | Status: DC
  Filled 2020-02-26: qty 0.35

## 2020-02-26 MED ORDER — INSULIN GLARGINE 100 UNIT/ML ~~LOC~~ SOLN
20.0000 [IU] | Freq: Every day | SUBCUTANEOUS | Status: DC
  Filled 2020-02-26: qty 0.2

## 2020-02-26 MED ORDER — DEXTROSE 50 % IV SOLN
25.0000 g | INTRAVENOUS | Status: AC
  Administered 2020-02-26: 25 g via INTRAVENOUS
  Filled 2020-02-26: qty 50

## 2020-02-26 MED ORDER — DEXTROSE 50 % IV SOLN
INTRAVENOUS | Status: AC
  Administered 2020-02-26: 50 mL
  Filled 2020-02-26: qty 50

## 2020-02-26 MED ORDER — POTASSIUM CHLORIDE 10 MEQ/100ML IV SOLN
10.0000 meq | INTRAVENOUS | Status: AC
  Administered 2020-02-26 (×2): 10 meq via INTRAVENOUS
  Filled 2020-02-26 (×2): qty 100

## 2020-02-26 NOTE — Progress Notes (Signed)
PROGRESS NOTE        PATIENT DETAILS Name: Nancy [name] Age: 20 y.o. Sex: female Date of Birth: Sep 24, 2000 Admit Date: 02/21/2020 Admitting Physician Dewayne Shorter Levora Dredge, MD [id number], Dorma Russell, MD  Brief Narrative: Patient is a 20 y.o. female DM-2, HTN, seizure disorder, gastroparesis, cyclic vomiting, marijuana use-multiple ER visits-presenting with abdominal pain/vomiting. See below for further details.  Significant events: 2/15>> presented to ED with abdominal pain/vomiting-admitted for further eval/treatment.  Significant studies: 2/15>> chest x-ray: No active disease 2/16>> x-ray abdomen: No SBO   Antimicrobial therapy: None  Microbiology data: None  Procedures : None  Consults: GI  DVT Prophylaxis : enoxaparin (LOVENOX) injection 40 mg Start: 02/22/20 1000   Subjective: Feels slightly better today-wants to try to see if she can eat.  Continues to have pain but somewhat less intense compared to yesterday.  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  Per Doy Hutching had extensive work-up in the past-including EGD/gastric emptying study (all negative).abdominal exam remains benign-restart Eliquis today and see how she does.  Await further recommendations from GI.  She has been extensively counseled regarding stopping marijuana use completely.  Constipation: Claims to have no BM since admission--unresponsive to smog enema/Dulcolax suppository/MiraLAX/senna.  GI recommends that we continue Amitiza, MiraLAX and senna.  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  Await further recommendations from GI.  Asymptomatic bacteriuria: No role for antibiotics  HLD: Stable-continue statin  HTN: Continue propanolol  Hypokalemia: Replete and recheck.  History of  seizures/pseudoseizures: Stable-continue Lamictal and Topamax  History of anxiety/depression: Stable-continue Seroquel, Remeron and BuSpar.  DM-2 (A1c 11.7 on 11/25/2019): CBGs relatively stable-mild hypoglycemic this morning but diet being advanced-continue Lantus 45 units, SSI-as diet advances-we will slowly uptitrate her insulin regimen.  Recent Labs    02/25/20 2121 02/26/20 0614 02/26/20 1100  GLUCAP 118* 86 67*    Obesity: Estimated body mass index is 34.19 kg/m as calculated from the following:   Height as of this encounter: 5\' 3"  (1.6 m).   Weight as of this encounter: 87.5 kg.     Diet: Diet Order            Diet full liquid Room service appropriate? Yes; Fluid consistency: Thin  Diet effective now                  Code Status: Full code   Family Communication: None at bedside  Disposition Plan: Status is: Inpatient   Dispo: The patient is from: Home              Anticipated d/c is to: Home              Anticipated d/c date is: 1 day              Patient currently is not medically stable to d/c.   Difficult to place patient No   Barriers to Discharge: Continues to have intractable abdominal pain/vomiting-back to n.p.o. today.  Antimicrobial agents: Anti-infectives (From admission, onward)   Start     Dose/Rate Route Frequency Ordered Stop   02/25/20 1700  erythromycin 250 mg in sodium chloride 0.9 % 100 mL IVPB        250 mg 100 mL/hr over 60 Minutes Intravenous 3 times daily before  meals 02/25/20 1327     02/23/20 1700  erythromycin ethylsuccinate (EES) 200 MG/5ML suspension 200 mg  Status:  Discontinued        200 mg Oral 3 times daily before meals 02/23/20 1607 02/25/20 1327       Time spent: 25 minutes-Greater than 50% of this time was spent in counseling, explanation of diagnosis, planning of further management, and coordination of care.  MEDICATIONS: Scheduled Meds: . ARIPiprazole  10 mg Oral Daily  . atorvastatin  20 mg Oral Daily   . busPIRone  10 mg Oral BID  . capsaicin   Topical BID  . enoxaparin (LOVENOX) injection  40 mg Subcutaneous Daily  . insulin aspart  0-9 Units Subcutaneous TID WC  . insulin glargine  45 Units Subcutaneous Daily  . lamoTRIgine  50 mg Oral BID  . lubiprostone  24 mcg Oral BID WC  . metoCLOPramide (REGLAN) injection  5 mg Intravenous Q8H  . mirtazapine  7.5 mg Oral QHS  . ondansetron (ZOFRAN) IV  4 mg Intravenous TID AC  . pantoprazole  40 mg Oral Daily  . polyethylene glycol  17 g Oral BID  . propranolol  20 mg Oral BID  . QUEtiapine  25 mg Oral q morning  . QUEtiapine  300 mg Oral QHS  . senna-docusate  2 tablet Oral QHS  . topiramate  25 mg Oral BID  . vitamin B-12  1,000 mcg Oral Daily   Continuous Infusions: . erythromycin 250 mg (02/25/20 1801)  . lactated ringers 1,000 mL with potassium chloride 20 mEq infusion 75 mL/hr at 02/26/20 0945  . potassium chloride 10 mEq (02/26/20 1106)   PRN Meds:.acetaminophen **OR** acetaminophen, promethazine   PHYSICAL EXAM: Vital signs: Vitals:   02/25/20 1219 02/25/20 1825 02/26/20 0036 02/26/20 0532  BP: (!) 148/92 (!) 131/95 (!) 134/95 116/75  Pulse: 99 (!) 109 88 78  Resp: 16 16 18 18   Temp: 98 F (36.7 C) 99 F (37.2 C) 98.2 F (36.8 C) 97.6 F (36.4 C)  TempSrc: Oral Oral Oral Oral  SpO2: 100% 100% 100% 100%  Weight:      Height:       Filed Weights   02/24/20 0811  Weight: 87.5 kg   Body mass index is 34.19 kg/m.   Gen Exam:Alert awake-not in any distress HEENT:atraumatic, normocephalic Chest: B/L clear to auscultation anteriorly CVS:S1S2 regular Abdomen:soft non tender, non distended Extremities:no edema Neurology: Non focal Skin: no rash  I have personally reviewed following labs and imaging studies  LABORATORY DATA: CBC: Recent Labs  Lab 02/21/20 1944 02/21/20 2122 02/22/20 0600 02/24/20 0036  WBC 11.1*  --  10.3 7.1  HGB 13.7 14.3 11.5* 12.0  HCT 43.0 42.0 36.8 35.6*  MCV 77.5*  --  79.7*  75.9*  PLT 403*  --  302 305    Basic Metabolic Panel: Recent Labs  Lab 02/21/20 1944 02/21/20 2110 02/21/20 2122 02/22/20 0600 02/23/20 0658 02/24/20 0036 02/25/20 0317  NA 137  --  136 136 137 136 140  K 3.7  --  3.6 3.1* 3.3* 3.0* 3.2*  CL 98  --   --  99 102 105 107  CO2 25  --   --  26 25 23 24   GLUCOSE 327*  --   --  201* 236* 187* 217*  BUN 7  --   --  8 7 5* <5*  CREATININE 0.71  --   --  0.53 0.67 0.58 0.63  CALCIUM 9.7  --   --  8.5* 8.8* 8.6* 8.6*  MG  --  1.9  --   --  1.9 1.9 2.1    GFR: Estimated Creatinine Clearance: 116.6 mL/min (by C-G formula based on SCr of 0.63 mg/dL).  Liver Function Tests: Recent Labs  Lab 02/21/20 1944  AST 23  ALT 15  ALKPHOS 86  BILITOT 1.2  PROT 7.0  ALBUMIN 4.1   Recent Labs  Lab 02/21/20 1944  LIPASE 30   No results for input(s): AMMONIA in the last 168 hours.  Coagulation Profile: No results for input(s): INR, PROTIME in the last 168 hours.  Cardiac Enzymes: No results for input(s): CKTOTAL, CKMB, CKMBINDEX, TROPONINI in the last 168 hours.  BNP (last 3 results) No results for input(s): PROBNP in the last 8760 hours.  Lipid Profile: No results for input(s): CHOL, HDL, LDLCALC, TRIG, CHOLHDL, LDLDIRECT in the last 72 hours.  Thyroid Function Tests: No results for input(s): TSH, T4TOTAL, FREET4, T3FREE, THYROIDAB in the last 72 hours.  Anemia Panel: Recent Labs    02/24/20 1006 02/24/20 1026  FERRITIN  --  20  TIBC  --  337  IRON  --  58  RETICCTPCT 1.3  --     Urine analysis:    Component Value Date/Time   COLORURINE YELLOW 02/21/2020 2348   APPEARANCEUR HAZY (A) 02/21/2020 2348   LABSPEC 1.032 (H) 02/21/2020 2348   PHURINE 7.0 02/21/2020 2348   GLUCOSEU >=500 (A) 02/21/2020 2348   HGBUR SMALL (A) 02/21/2020 2348   BILIRUBINUR NEGATIVE 02/21/2020 2348   BILIRUBINUR negative 07/13/2018 1651   KETONESUR 80 (A) 02/21/2020 2348   PROTEINUR NEGATIVE 02/21/2020 2348   UROBILINOGEN 0.2  07/13/2018 1651   UROBILINOGEN 1.0 07/09/2017 1324   NITRITE NEGATIVE 02/21/2020 2348   LEUKOCYTESUR SMALL (A) 02/21/2020 2348    Sepsis Labs: Lactic Acid, Venous    Component Value Date/Time   LATICACIDVEN 2.4 (HH) 08/23/2019 1020    MICROBIOLOGY: Recent Results (from the past 240 hour(s))  Culture, Urine     Status: Abnormal   Collection Time: 02/22/20  4:24 AM   Specimen: Urine, Random  Result Value Ref Range Status   Specimen Description URINE, RANDOM  Final   Special Requests   Final    NONE Performed at [facility], [address] [place], [place] [address]    Culture >=100,000 COLONIES/mL ESCHERICHIA COLI (A)  Final   Report Status 02/24/2020 FINAL  Final   Organism ID, Bacteria ESCHERICHIA COLI (A)  Final      Susceptibility   Escherichia coli - MIC*    AMPICILLIN >=32 RESISTANT Resistant     CEFAZOLIN <=4 SENSITIVE Sensitive     CEFEPIME <=0.12 SENSITIVE Sensitive     CEFTRIAXONE <=0.25 SENSITIVE Sensitive     CIPROFLOXACIN <=0.25 SENSITIVE Sensitive     GENTAMICIN <=1 SENSITIVE Sensitive     IMIPENEM <=0.25 SENSITIVE Sensitive     NITROFURANTOIN <=16 SENSITIVE Sensitive     TRIMETH/SULFA <=20 SENSITIVE Sensitive     AMPICILLIN/SULBACTAM >=32 RESISTANT Resistant     PIP/TAZO <=4 SENSITIVE Sensitive     * >=100,000 COLONIES/mL ESCHERICHIA COLI  SARS CORONAVIRUS 2 (TAT 6-24 HRS) Nasopharyngeal Nasopharyngeal Swab     Status: None   Collection Time: 02/22/20 10:38 AM   Specimen: Nasopharyngeal Swab  Result Value Ref Range Status   SARS Coronavirus 2 NEGATIVE NEGATIVE Final    Comment: (NOTE) SARS-CoV-2 target nucleic acids are NOT DETECTED.  The SARS-CoV-2 RNA is generally detectable in upper and  lower respiratory specimens during the acute phase of infection. Negative results do not preclude SARS-CoV-2 infection, do not rule out co-infections with other pathogens, and should not be used as the sole basis for treatment or other patient management  decisions. Negative results must be combined with clinical observations, patient history, and epidemiological information. The expected result is Negative.  Fact Sheet for Patients: HairSlick.no  Fact Sheet for Healthcare Providers: quierodirigir.com  This test is not yet approved or cleared by the [place] FDA and  has been authorized for detection and/or diagnosis of SARS-CoV-2 by FDA under an Emergency Use Authorization (EUA). This EUA will remain  in effect (meaning this test can be used) for the duration of the COVID-19 declaration under Se ction 564(b)(1) of the Act, 21 U.S.C. section 360bbb-3(b)(1), unless the authorization is terminated or revoked sooner.  Performed at [facility], [address], [place], [place] [address]     RADIOLOGY STUDIES/RESULTS: No results found.   LOS: 4 days   Jeoffrey Massed, MD  [facility]    To contact the attending provider between 7A-7P or the covering provider during after hours 7P-7A, please log into the web site [email or web address] and access using universal [place] password for that web site. If you do not have the password, please call the hospital operator.  02/26/2020, 11:14 AM

## 2020-02-26 NOTE — Progress Notes (Signed)
  Hypoglycemic Event  CBG: 42  Treatment: 8oz orange juice, patient ate cake that she left over for lunch  Symptoms: Pt felt clammy  Follow-up CBG: Time:2052 CBG Result:38  Possible Reasons for Event: Decreased oral intake  Comments/MD notified:Dr. Toniann Fail  Continued Treatment: 1 amp D50 IVP (25g)  Symptoms:  Clammy and nauseous  Follow-up CBG: Time:2142 CBG Result:106  Comments/MD notified:Dr. Toniann Fail, provider stated will provide orders to change fluids and increase CBG monitoring   [facility]

## 2020-02-26 NOTE — Progress Notes (Signed)
Hypoglycemic Event  CBG: 31  Treatment: 4 oz orange juice and 1 amp D50 IVP  Symptoms: clammy and diaphoretic  Follow-up CBG: Time: 1433 CBG Result:144  Possible Reasons for Event: decreased oral intake  Comments/MD notified: Dr Reece Agar. Shanker notified. Pt with strong desire for solid food. Md will maintain LR as fluid type and place new insulin and diet orders.     Marjean Donna

## 2020-02-27 DIAGNOSIS — R112 Nausea with vomiting, unspecified: Secondary | ICD-10-CM | POA: Diagnosis not present

## 2020-02-27 DIAGNOSIS — R1084 Generalized abdominal pain: Secondary | ICD-10-CM | POA: Diagnosis not present

## 2020-02-27 LAB — TSH: TSH: 0.997 u[IU]/mL (ref 0.350–4.500)

## 2020-02-27 LAB — GLUCOSE, CAPILLARY
Glucose-Capillary: 161 mg/dL — ABNORMAL HIGH (ref 70–99)
Glucose-Capillary: 216 mg/dL — ABNORMAL HIGH (ref 70–99)
Glucose-Capillary: 242 mg/dL — ABNORMAL HIGH (ref 70–99)
Glucose-Capillary: 279 mg/dL — ABNORMAL HIGH (ref 70–99)
Glucose-Capillary: 157 mg/dL — ABNORMAL HIGH (ref 70–99)
Glucose-Capillary: 174 mg/dL — ABNORMAL HIGH (ref 70–99)
Glucose-Capillary: 205 mg/dL — ABNORMAL HIGH (ref 70–99)

## 2020-02-27 LAB — BASIC METABOLIC PANEL
Anion gap: 9 (ref 5–15)
BUN: 6 mg/dL (ref 6–20)
CO2: 18 mmol/L — ABNORMAL LOW (ref 22–32)
Calcium: 8.7 mg/dL — ABNORMAL LOW (ref 8.9–10.3)
Chloride: 111 mmol/L (ref 98–111)
Creatinine, Ser: 0.75 mg/dL (ref 0.44–1.00)
GFR, Estimated: >60 mL/min (ref >60)
Glucose, Bld: 224 mg/dL — ABNORMAL HIGH (ref 70–99)
Potassium: 4.5 mmol/L (ref 3.5–5.1)
Sodium: 138 mmol/L (ref 135–145)

## 2020-02-27 MED ORDER — INSULIN GLARGINE 100 UNIT/ML ~~LOC~~ SOLN
12.0000 [IU] | Freq: Every day | SUBCUTANEOUS | Status: DC
  Administered 2020-02-27 – 2020-02-28 (×2): 12 [IU] via SUBCUTANEOUS
  Filled 2020-02-27 (×3): qty 0.12

## 2020-02-27 MED ORDER — PEG 3350-KCL-NA BICARB-NACL 420 G PO SOLR
4000.0000 mL | Freq: Once | ORAL | Status: AC
  Administered 2020-02-27: 4000 mL via ORAL
  Filled 2020-02-27: qty 4000

## 2020-02-27 MED ORDER — INSULIN ASPART 100 UNIT/ML ~~LOC~~ SOLN
0.0000 [IU] | Freq: Three times a day (TID) | SUBCUTANEOUS | Status: DC
  Administered 2020-02-27 – 2020-02-28 (×4): 2 [IU] via SUBCUTANEOUS
  Administered 2020-02-28: 0 [IU] via SUBCUTANEOUS
  Administered 2020-02-29: 7 [IU] via SUBCUTANEOUS

## 2020-02-27 MED ORDER — LACTATED RINGERS IV BOLUS
500.0000 mL | Freq: Once | INTRAVENOUS | Status: AC
  Administered 2020-02-27: 500 mL via INTRAVENOUS

## 2020-02-27 NOTE — Progress Notes (Signed)
PROGRESS NOTE        PATIENT DETAILS Name: Nancy [name] Age: 20 y.o. Sex: female Date of Birth: 05-25-2000 Admit Date: 02/21/2020 Admitting Physician Dewayne Shorter Levora Dredge, MD [id number], Dorma Russell, MD  Brief Narrative: Patient is a 20 y.o. female DM-2, HTN, seizure disorder, gastroparesis, cyclic vomiting, marijuana use-multiple ER visits-presenting with abdominal pain/vomiting. See below for further details.  Significant events: 2/15>> presented to ED with abdominal pain/vomiting-admitted for further eval/treatment.  Significant studies: 2/15>> chest x-ray: No active disease 2/16>> x-ray abdomen: No SBO   Antimicrobial therapy: None  Microbiology data: None  Procedures : None  Consults: GI  DVT Prophylaxis : enoxaparin (LOVENOX) injection 40 mg Start: 02/22/20 1000   Subjective: Continues to have abdominal pain-but "less intense"-but has been continuous for the past 12 hours.  Nauseous but no vomiting.  No BM since admission-claims it has been a month since she last had a bowel movement.  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  Abdominal exam is benign this morning-soft without any obvious tenderness.   Per Doy Hutching had extensive work-up in the past-including EGD/gastric emptying study (all negative).  Await further input from GI-May benefit from starting colon prep to see if relieving her constipation improves abdominal pain.  She has been counseled daily on the importance of stopping marijuana use completely.  Constipation: Claims to have no BM since admission--unresponsive to smog enema/Dulcolax suppository/MiraLAX/senna.  Await input from GI today.  Sinus tachycardia: Probably response to stress/pain/vomiting-check TSH-bolus 500 cc of LR.   Monitor on telemetry-follow closely  Asymptomatic bacteriuria: No role for antibiotics  HLD: Stable-continue statin  HTN: Continue propanolol  Hypokalemia: Repleted  History of seizures/pseudoseizures: Stable-continue Lamictal and Topamax  History of anxiety/depression: Stable-continue Seroquel, Remeron and BuSpar.  DM-1 (A1c 11.7 on 11/25/2019) with hypoglycemia: Developed hypoglycemia yesterday-CBGs are stable today-claims she is in DM-1-hence will start Lantus 12 units-sensitive SSI and see how she does.   Recent Labs    02/27/20 0618 02/27/20 0803 02/27/20 1115  GLUCAP 242* 279* 157*    Obesity: Estimated body mass index is 34.19 kg/m as calculated from the following:   Height as of this encounter: 5\' 3"  (1.6 m).   Weight as of this encounter: 87.5 kg.     Diet: Diet Order            DIET SOFT Room service appropriate? Yes; Fluid consistency: Thin  Diet effective now                  Code Status: Full code   Family Communication: None at bedside  Disposition Plan: Status is: Inpatient   Dispo: The patient is from: Home              Anticipated d/c is to: Home              Anticipated d/c date is: 1 day              Patient currently is not medically stable to d/c.   Difficult to place patient No   Barriers to Discharge: Continues to have intractable abdominal pain/vomiting  Antimicrobial agents: Anti-infectives (From admission, onward)   Start     Dose/Rate Route Frequency Ordered Stop   02/25/20 1700  erythromycin 250 mg in sodium chloride 0.9 % 100 mL IVPB        250 mg 100 mL/hr over 60 Minutes Intravenous 3 times daily before meals 02/25/20 1327     02/23/20 1700  erythromycin ethylsuccinate (EES) 200 MG/5ML suspension 200 mg  Status:  Discontinued        200 mg Oral 3 times daily before meals 02/23/20 1607 02/25/20 1327       Time spent: 25 minutes-Greater than 50% of this time was spent in counseling, explanation of diagnosis,  planning of further management, and coordination of care.  MEDICATIONS: Scheduled Meds: . ARIPiprazole  10 mg Oral Daily  . atorvastatin  20 mg Oral Daily  . busPIRone  10 mg Oral BID  . capsaicin   Topical BID  . enoxaparin (LOVENOX) injection  40 mg Subcutaneous Daily  . insulin aspart  0-9 Units Subcutaneous TID WC  . insulin glargine  12 Units Subcutaneous Daily  . lamoTRIgine  50 mg Oral BID  . lubiprostone  24 mcg Oral BID WC  . metoCLOPramide (REGLAN) injection  5 mg Intravenous Q8H  . mirtazapine  7.5 mg Oral QHS  . ondansetron (ZOFRAN) IV  4 mg Intravenous TID AC  . pantoprazole  40 mg Oral Daily  . polyethylene glycol  17 g Oral BID  . propranolol  20 mg Oral BID  . QUEtiapine  25 mg Oral q morning  . QUEtiapine  300 mg Oral QHS  . senna-docusate  2 tablet Oral QHS  . topiramate  25 mg Oral BID  . vitamin B-12  1,000 mcg Oral Daily   Continuous Infusions: . erythromycin 250 mg (02/27/20 0942)  . lactated ringers 1,000 mL with potassium chloride 20 mEq infusion 75 mL/hr at 02/27/20 1115  . lactated ringers     PRN Meds:.acetaminophen **OR** acetaminophen, promethazine   PHYSICAL EXAM: Vital signs: Vitals:   02/27/20 0836 02/27/20 0939 02/27/20 1018 02/27/20 1116  BP: (!) 94/55 109/72 (!) 161/65 118/79  Pulse:  (!) 120 (!) 129 (!) 122  Resp:  16 16 16   Temp:  98.6 F (37 C) 98.4 F (36.9 C) 98.4 F (36.9 C)  TempSrc:  Oral Oral Oral  SpO2:  100% 99% 98%  Weight:      Height:       Filed Weights   02/24/20 0811  Weight: 87.5 kg   Body mass index is 34.19 kg/m.   Gen Exam:Alert awake-not in any distress HEENT:atraumatic, normocephalic Chest: B/L clear to auscultation anteriorly CVS:S1S2 regular Abdomen:soft non tender, non distended Extremities:no edema Neurology: Non focal Skin: no rash  I have personally reviewed following labs and imaging studies  LABORATORY DATA: CBC: Recent Labs  Lab 02/21/20 1944 02/21/20 2122 02/22/20 0600  02/24/20 0036  WBC 11.1*  --  10.3 7.1  HGB 13.7 14.3 11.5* 12.0  HCT 43.0 42.0 36.8 35.6*  MCV 77.5*  --  79.7* 75.9*  PLT 403*  --  302 305    Basic Metabolic Panel: Recent Labs  Lab 02/21/20 2110 02/21/20 2122 02/22/20 0600 02/23/20 0658 02/24/20 0036 02/25/20 0317 02/27/20 0426  NA  --    < > 136 137 136 140 138  K  --    < > 3.1* 3.3* 3.0* 3.2* 4.5  CL  --   --  99 102 105 107 111  CO2  --   --  26 25 23 24  18*  GLUCOSE  --   --  201* 236* 187*  217* 224*  BUN  --   --  8 7 5* <5* 6  CREATININE  --   --  0.53 0.67 0.58 0.63 0.75  CALCIUM  --   --  8.5* 8.8* 8.6* 8.6* 8.7*  MG 1.9  --   --  1.9 1.9 2.1  --    < > = values in this interval not displayed.    GFR: Estimated Creatinine Clearance: 116.6 mL/min (by C-G formula based on SCr of 0.75 mg/dL).  Liver Function Tests: Recent Labs  Lab 02/21/20 1944  AST 23  ALT 15  ALKPHOS 86  BILITOT 1.2  PROT 7.0  ALBUMIN 4.1   Recent Labs  Lab 02/21/20 1944  LIPASE 30   No results for input(s): AMMONIA in the last 168 hours.  Coagulation Profile: No results for input(s): INR, PROTIME in the last 168 hours.  Cardiac Enzymes: No results for input(s): CKTOTAL, CKMB, CKMBINDEX, TROPONINI in the last 168 hours.  BNP (last 3 results) No results for input(s): PROBNP in the last 8760 hours.  Lipid Profile: No results for input(s): CHOL, HDL, LDLCALC, TRIG, CHOLHDL, LDLDIRECT in the last 72 hours.  Thyroid Function Tests: No results for input(s): TSH, T4TOTAL, FREET4, T3FREE, THYROIDAB in the last 72 hours.  Anemia Panel: No results for input(s): VITAMINB12, FOLATE, FERRITIN, TIBC, IRON, RETICCTPCT in the last 72 hours.  Urine analysis:    Component Value Date/Time   COLORURINE YELLOW 02/21/2020 2348   APPEARANCEUR HAZY (A) 02/21/2020 2348   LABSPEC 1.032 (H) 02/21/2020 2348   PHURINE 7.0 02/21/2020 2348   GLUCOSEU >=500 (A) 02/21/2020 2348   HGBUR SMALL (A) 02/21/2020 2348   BILIRUBINUR NEGATIVE  02/21/2020 2348   BILIRUBINUR negative 07/13/2018 1651   KETONESUR 80 (A) 02/21/2020 2348   PROTEINUR NEGATIVE 02/21/2020 2348   UROBILINOGEN 0.2 07/13/2018 1651   UROBILINOGEN 1.0 07/09/2017 1324   NITRITE NEGATIVE 02/21/2020 2348   LEUKOCYTESUR SMALL (A) 02/21/2020 2348    Sepsis Labs: Lactic Acid, Venous    Component Value Date/Time   LATICACIDVEN 2.4 (HH) 08/23/2019 1020    MICROBIOLOGY: Recent Results (from the past 240 hour(s))  Culture, Urine     Status: Abnormal   Collection Time: 02/22/20  4:24 AM   Specimen: Urine, Random  Result Value Ref Range Status   Specimen Description URINE, RANDOM  Final   Special Requests   Final    NONE Performed at [facility], [address], [place], [place] [address]    Culture >=100,000 COLONIES/mL ESCHERICHIA COLI (A)  Final   Report Status 02/24/2020 FINAL  Final   Organism ID, Bacteria ESCHERICHIA COLI (A)  Final      Susceptibility   Escherichia coli - MIC*    AMPICILLIN >=32 RESISTANT Resistant     CEFAZOLIN <=4 SENSITIVE Sensitive     CEFEPIME <=0.12 SENSITIVE Sensitive     CEFTRIAXONE <=0.25 SENSITIVE Sensitive     CIPROFLOXACIN <=0.25 SENSITIVE Sensitive     GENTAMICIN <=1 SENSITIVE Sensitive     IMIPENEM <=0.25 SENSITIVE Sensitive     NITROFURANTOIN <=16 SENSITIVE Sensitive     TRIMETH/SULFA <=20 SENSITIVE Sensitive     AMPICILLIN/SULBACTAM >=32 RESISTANT Resistant     PIP/TAZO <=4 SENSITIVE Sensitive     * >=100,000 COLONIES/mL ESCHERICHIA COLI  SARS CORONAVIRUS 2 (TAT 6-24 HRS) Nasopharyngeal Nasopharyngeal Swab     Status: None   Collection Time: 02/22/20 10:38 AM   Specimen: Nasopharyngeal Swab  Result Value Ref Range Status  SARS Coronavirus 2 NEGATIVE NEGATIVE Final    Comment: (NOTE) SARS-CoV-2 target nucleic acids are NOT DETECTED.  The SARS-CoV-2 RNA is generally detectable in upper and lower respiratory specimens during the acute phase of infection. Negative results do not preclude SARS-CoV-2  infection, do not rule out co-infections with other pathogens, and should not be used as the sole basis for treatment or other patient management decisions. Negative results must be combined with clinical observations, patient history, and epidemiological information. The expected result is Negative.  Fact Sheet for Patients: HairSlick.no  Fact Sheet for Healthcare Providers: quierodirigir.com  This test is not yet approved or cleared by the [place] FDA and  has been authorized for detection and/or diagnosis of SARS-CoV-2 by FDA under an Emergency Use Authorization (EUA). This EUA will remain  in effect (meaning this test can be used) for the duration of the COVID-19 declaration under Se ction 564(b)(1) of the Act, 21 U.S.C. section 360bbb-3(b)(1), unless the authorization is terminated or revoked sooner.  Performed at [facility], [address], [place], [place] [address]     RADIOLOGY STUDIES/RESULTS: No results found.   LOS: 5 days   Jeoffrey Massed, MD  [facility]    To contact the attending provider between 7A-7P or the covering provider during after hours 7P-7A, please log into the web site [email or web address] and access using universal [place] password for that web site. If you do not have the password, please call the hospital operator.  02/27/2020, 11:34 AM

## 2020-02-27 NOTE — Progress Notes (Signed)
Provider paged about CBG 242. Pt tolerating breakfast. Dr. Toniann Fail stated will review chart, continue to monitor CBG.

## 2020-02-27 NOTE — Progress Notes (Signed)
[facility] ROUNDING NOTE   Subjective: No acute events overnight.  Nausea this morning, but reports overall improved.  No emesis.  Still no BM.  Objective: Vital signs in last 24 hours: Temp:  [97.9 F (36.6 C)-98.8 F (37.1 C)] 98 F (36.7 C) (02/21 1240) Pulse Rate:  [84-135] 112 (02/21 1240) Resp:  [16-20] 16 (02/21 1240) BP: (94-161)/(47-91) 127/91 (02/21 1240) SpO2:  [98 %-100 %] 100 % (02/21 1240) Last BM Date:  (mid January) General: NAD Abdomen: Soft, NT, ND, +BS Ext: No c/c/e    Intake/Output from previous day: 02/20 0701 - 02/21 0700 In: 2599.6 [P.O.:240; I.V.:2027.1; IV Piggyback:332.6] Out: -  Intake/Output this shift: Total I/O In: 1265.9 [P.O.:240; I.V.:365; IV Piggyback:660.9] Out: -    Lab Results: No results for input(s): WBC, HGB, PLT, MCV in the last 72 hours. BMET Recent Labs    02/25/20 0317 02/27/20 0426  NA 140 138  K 3.2* 4.5  CL 107 111  CO2 24 18*  GLUCOSE 217* 224*  BUN <5* 6  CREATININE 0.63 0.75  CALCIUM 8.6* 8.7*   LFT No results for input(s): PROT, ALBUMIN, AST, ALT, ALKPHOS, BILITOT, BILIDIR, IBILI in the last 72 hours. PT/INR No results for input(s): INR in the last 72 hours.    Imaging/Other results: No results found.    Assessment and Plan:  1) Chronic, recurrent Nausea/Vomiting 2) Chronic abdominal pain -Has had an extensive work-up in the past to include EGD x2, GES x2, multiple cross-sectional imaging studies, all essentially unrevealing/negative.  Has received intensive inpatient treatment to include IV Reglan, erythromycin, PPI and reports some improvement in last 24 hours or so -While her GES was negative for gastroparesis, I recommended low-fat/low fiber diet.  Would need to take fiber supplement though given underlying constipation -Plan for 6 small meals/day, with a couple meals being liquid based (i.e. Ensure, boost, etc.) -Strongly counseled on complete cessation of all marijuana.   Discussed high suspicion for overlapping cannabinoid hyperemesis syndrome -Strongly counseled on close follow-up with PCM for poorly controlled diabetes  3) Chronic constipation -Reports no BM since hospital admission, but KUB on 2/16 was essentially normal.  Nonetheless looking back through old imaging studies PTA, she does have retained stools -Agree with trial of bowel prep for clear out -Resume MiraLAX, senna, and Amitiza -Reports she previously failed Motegrity, but cannot elaborate on that further today.  Otherwise, Motegrity would actually be a pretty good agent for her given presumed slow transit, as this can also offset gastric motility issues  4) Diabetes -As above, counseled on close follow-up with PCM after discharge (A1c 11.7)    Shellia Cleverly, DO  02/27/2020, 3:51 PM [facility] Pager [phone]

## 2020-02-28 ENCOUNTER — Inpatient Hospital Stay (HOSPITAL_COMMUNITY): Payer: Medicaid Other | Admitting: Certified Registered"

## 2020-02-28 ENCOUNTER — Encounter (HOSPITAL_COMMUNITY): Payer: Self-pay | Admitting: Internal Medicine

## 2020-02-28 ENCOUNTER — Encounter (HOSPITAL_COMMUNITY)
Admission: EM | Disposition: A | Discharge status: Home / Self Care | Payer: Self-pay | Source: Home / Self Care | Attending: Internal Medicine

## 2020-02-28 DIAGNOSIS — K2289 Other specified disease of esophagus: Secondary | ICD-10-CM

## 2020-02-28 DIAGNOSIS — R1084 Generalized abdominal pain: Secondary | ICD-10-CM | POA: Diagnosis not present

## 2020-02-28 DIAGNOSIS — K5900 Constipation, unspecified: Secondary | ICD-10-CM

## 2020-02-28 DIAGNOSIS — R112 Nausea with vomiting, unspecified: Secondary | ICD-10-CM | POA: Diagnosis not present

## 2020-02-28 HISTORY — PX: BIOPSY: SHX5522

## 2020-02-28 HISTORY — PX: ESOPHAGOGASTRODUODENOSCOPY (EGD) WITH PROPOFOL: SHX5813

## 2020-02-28 HISTORY — PX: FLEXIBLE SIGMOIDOSCOPY: SHX5431

## 2020-02-28 LAB — GLUCOSE, CAPILLARY
Glucose-Capillary: 159 mg/dL — ABNORMAL HIGH (ref 70–99)
Glucose-Capillary: 156 mg/dL — ABNORMAL HIGH (ref 70–99)
Glucose-Capillary: 130 mg/dL — ABNORMAL HIGH (ref 70–99)
Glucose-Capillary: 137 mg/dL — ABNORMAL HIGH (ref 70–99)
Glucose-Capillary: 298 mg/dL — ABNORMAL HIGH (ref 70–99)

## 2020-02-28 SURGERY — ESOPHAGOGASTRODUODENOSCOPY (EGD) WITH PROPOFOL
Anesthesia: Monitor Anesthesia Care

## 2020-02-28 MED ORDER — HYOSCYAMINE SULFATE 0.125 MG SL SUBL
0.1250 mg | SUBLINGUAL_TABLET | SUBLINGUAL | Status: DC | PRN
  Filled 2020-02-28: qty 1

## 2020-02-28 MED ORDER — LIDOCAINE 2% (20 MG/ML) 5 ML SYRINGE
INTRAMUSCULAR | Status: DC | PRN
  Administered 2020-02-28: 100 mg via INTRAVENOUS

## 2020-02-28 MED ORDER — METHYLNALTREXONE BROMIDE 12 MG/0.6ML ~~LOC~~ SOLN
12.0000 mg | Freq: Once | SUBCUTANEOUS | Status: AC
  Administered 2020-02-28: 12 mg via SUBCUTANEOUS
  Filled 2020-02-28: qty 0.6

## 2020-02-28 MED ORDER — PROPOFOL 10 MG/ML IV BOLUS
INTRAVENOUS | Status: DC | PRN
  Administered 2020-02-28 (×3): 50 mg via INTRAVENOUS

## 2020-02-28 MED ORDER — PROPOFOL 500 MG/50ML IV EMUL
INTRAVENOUS | Status: DC | PRN
  Administered 2020-02-28: 150 ug/kg/min via INTRAVENOUS

## 2020-02-28 MED ORDER — EPHEDRINE SULFATE-NACL 50-0.9 MG/10ML-% IV SOSY
PREFILLED_SYRINGE | INTRAVENOUS | Status: DC | PRN
  Administered 2020-02-28: 10 mg via INTRAVENOUS

## 2020-02-28 MED ORDER — PHENYLEPHRINE 40 MCG/ML (10ML) SYRINGE FOR IV PUSH (FOR BLOOD PRESSURE SUPPORT)
PREFILLED_SYRINGE | INTRAVENOUS | Status: DC | PRN
  Administered 2020-02-28: 160 ug via INTRAVENOUS
  Administered 2020-02-28 (×2): 120 ug via INTRAVENOUS

## 2020-02-28 MED ORDER — LACTATED RINGERS IV SOLN
INTRAVENOUS | Status: DC
  Administered 2020-02-28: 1000 mL via INTRAVENOUS

## 2020-02-28 MED ORDER — GLYCOPYRROLATE 0.2 MG/ML IJ SOLN
INTRAMUSCULAR | Status: DC | PRN
  Administered 2020-02-28: .1 mg via INTRAVENOUS

## 2020-02-28 SURGICAL SUPPLY — 15 items

## 2020-02-28 NOTE — Transfer of Care (Signed)
Immediate Anesthesia Transfer of Care Note  Patient: Nancy [name]  Procedure(s) Performed: ESOPHAGOGASTRODUODENOSCOPY (EGD) WITH PROPOFOL (N/A ) FLEXIBLE SIGMOIDOSCOPY (N/A ) BIOPSY  Patient Location: Endoscopy Unit  Anesthesia Type:MAC  Level of Consciousness: drowsy and patient cooperative  Airway & Oxygen Therapy: Patient Spontanous Breathing  Post-op Assessment: Report given to RN and Post -op Vital signs reviewed and stable  Post vital signs: Reviewed and stable  Last Vitals:  Vitals Value Taken Time  BP    Temp    Pulse 94 02/28/20 1404  Resp 18 02/28/20 1404  SpO2 100 % 02/28/20 1404  Vitals shown include unvalidated device data.  Last Pain:  Vitals:   02/28/20 1239  TempSrc: Oral  PainSc: 0-No pain         Complications: No complications documented.

## 2020-02-28 NOTE — Anesthesia Preprocedure Evaluation (Addendum)
Anesthesia Evaluation  Patient identified by MRN, date of birth, ID band Patient awake    Reviewed: Allergy & Precautions, H&P , NPO status , Patient's Chart, lab work & pertinent test results  Airway Mallampati: II  TM Distance: >3 FB Neck ROM: Full    Dental no notable dental hx. (+) Teeth Intact, Dental Advisory Given   Pulmonary neg pulmonary ROS,    Pulmonary exam normal breath sounds clear to auscultation       Cardiovascular hypertension, +CHF   Rhythm:Regular Rate:Normal     Neuro/Psych Anxiety Depression negative neurological ROS     GI/Hepatic negative GI ROS, Neg liver ROS,   Endo/Other  diabetes, Insulin Dependent  Renal/GU negative Renal ROS  negative genitourinary   Musculoskeletal   Abdominal   Peds  Hematology negative hematology ROS (+)   Anesthesia Other Findings   Reproductive/Obstetrics negative OB ROS                            Anesthesia Physical Anesthesia Plan  ASA: III  Anesthesia Plan: MAC   Post-op Pain Management:    Induction: Intravenous  PONV Risk Score and Plan: 2 and Propofol infusion and Treatment may vary due to age or medical condition  Airway Management Planned: Simple Face Mask  Additional Equipment:   Intra-op Plan:   Post-operative Plan:   Informed Consent: I have reviewed the patients History and Physical, chart, labs and discussed the procedure including the risks, benefits and alternatives for the proposed anesthesia with the patient or authorized representative who has indicated his/her understanding and acceptance.     Dental advisory given  Plan Discussed with: CRNA  Anesthesia Plan Comments:         Anesthesia Quick Evaluation

## 2020-02-28 NOTE — Anesthesia Postprocedure Evaluation (Signed)
Anesthesia Post Note  Patient: Nancy [name]  Procedure(s) Performed: ESOPHAGOGASTRODUODENOSCOPY (EGD) WITH PROPOFOL (N/A ) FLEXIBLE SIGMOIDOSCOPY (N/A ) BIOPSY     Patient location during evaluation: Endoscopy Anesthesia Type: MAC Level of consciousness: awake and alert Pain management: pain level controlled Vital Signs Assessment: post-procedure vital signs reviewed and stable Respiratory status: spontaneous breathing, nonlabored ventilation and respiratory function stable Cardiovascular status: stable and blood pressure returned to baseline Postop Assessment: no apparent nausea or vomiting Anesthetic complications: no   No complications documented.  Last Vitals:  Vitals:   02/28/20 1406 02/28/20 1426  BP: 125/67 117/69  Pulse: 95 (!) 102  Resp: (!) 21   Temp: 36.5 C   SpO2: 100% 99%    Last Pain:  Vitals:   02/28/20 1426  TempSrc:   PainSc: 0-No pain                 Dakarai Mcglocklin,W. EDMOND

## 2020-02-28 NOTE — Progress Notes (Signed)
PROGRESS NOTE        PATIENT DETAILS Name: Nancy [name] Age: 20 y.o. Sex: female Date of Birth: 2000/06/27 Admit Date: 02/21/2020 Admitting Physician Dewayne Shorter Levora Dredge, MD [id number], Dorma Russell, MD  Brief Narrative: Patient is a 20 y.o. female DM-2, HTN, seizure disorder, gastroparesis, cyclic vomiting, marijuana use-multiple ER visits-presenting with abdominal pain/vomiting. See below for further details.  Significant events: 2/15>> presented to ED with abdominal pain/vomiting-admitted for further eval/treatment.  Significant studies: 2/15>> chest x-ray: No active disease 2/16>> x-ray abdomen: No SBO   Antimicrobial therapy: None  Microbiology data: None  Procedures : None  Consults: GI  DVT Prophylaxis : enoxaparin (LOVENOX) injection 40 mg Start: 02/22/20 1000   Subjective: Vomited few times this morning-continues to have intermittent abdominal pain. 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 Has had extensive work-up in the past-including EGD/gastric emptying study (negative). Some suspicion that constipation may be worsening her chronic abdominal pain--no response to bowel regimen with MiraLAX/senna/Dulcolax/smog enema and GoLYTELY-spoke with GI MD-Dr. Robin Searing she does not have history of narcotic bowel-will try Relistor to see if this helps with moving her bowels/BM.   She has been counseled daily on the importance of stopping marijuana use completely.  Constipation: Claims to have no BM since January-as noted above-no response to Dulcolax/enema/GoLYTELY-trying Relistor today.  Sinus tachycardia: Much  better-physiological response to stress-TSH stable. Continue to monitor in telemetry.  Asymptomatic bacteriuria: No role for antibiotics  HLD: Stable-continue statin  HTN: Continue propanolol  Hypokalemia: Repleted  History of seizures/pseudoseizures: Stable-continue Lamictal and Topamax  History of anxiety/depression: Stable-continue Seroquel, Remeron and BuSpar.  DM-1 (A1c 11.7 on 11/25/2019) with hypoglycemia: No further episodes of hypoglycemia-CBGs currently stable with Lantus 12 units-and SSI-avoid tight glycemic control till vomiting/diet stabilizes.   Recent Labs    02/27/20 1602 02/27/20 2107 02/28/20 0611  GLUCAP 174* 205* 159*    Obesity: Estimated body mass index is 34.19 kg/m as calculated from the following:   Height as of this encounter: 5\' 3"  (1.6 m).   Weight as of this encounter: 87.5 kg.     Diet: Diet Order            DIET SOFT Room service appropriate? Yes; Fluid consistency: Thin  Diet effective now                  Code Status: Full code   Family Communication: None at bedside  Disposition Plan: Status is: Inpatient   Dispo: The patient is from: Home              Anticipated d/c is to: Home              Anticipated d/c date is: 1 day              Patient currently is not medically stable to d/c.   Difficult to place patient No   Barriers to Discharge: Continues to have intractable abdominal pain/vomiting  Antimicrobial agents: Anti-infectives (From admission, onward)   Start     Dose/Rate Route Frequency Ordered Stop   02/25/20 1700  erythromycin 250 mg in sodium chloride 0.9 %  100 mL IVPB        250 mg 100 mL/hr over 60 Minutes Intravenous 3 times daily before meals 02/25/20 1327     02/23/20 1700  erythromycin ethylsuccinate (EES) 200 MG/5ML suspension 200 mg  Status:  Discontinued        200 mg Oral 3 times daily before meals 02/23/20 1607 02/25/20 1327       Time spent: 25 minutes-Greater than 50% of this time was  spent in counseling, explanation of diagnosis, planning of further management, and coordination of care.  MEDICATIONS: Scheduled Meds: . ARIPiprazole  10 mg Oral Daily  . atorvastatin  20 mg Oral Daily  . busPIRone  10 mg Oral BID  . capsaicin   Topical BID  . enoxaparin (LOVENOX) injection  40 mg Subcutaneous Daily  . insulin aspart  0-9 Units Subcutaneous TID WC  . insulin glargine  12 Units Subcutaneous Daily  . lamoTRIgine  50 mg Oral BID  . lubiprostone  24 mcg Oral BID WC  . methylnaltrexone  12 mg Subcutaneous Once  . metoCLOPramide (REGLAN) injection  5 mg Intravenous Q8H  . mirtazapine  7.5 mg Oral QHS  . ondansetron (ZOFRAN) IV  4 mg Intravenous TID AC  . pantoprazole  40 mg Oral Daily  . polyethylene glycol  17 g Oral BID  . propranolol  20 mg Oral BID  . QUEtiapine  25 mg Oral q morning  . QUEtiapine  300 mg Oral QHS  . senna-docusate  2 tablet Oral QHS  . topiramate  25 mg Oral BID  . vitamin B-12  1,000 mcg Oral Daily   Continuous Infusions: . erythromycin 250 mg (02/28/20 0901)  . lactated ringers 1,000 mL with potassium chloride 20 mEq infusion 75 mL/hr at 02/27/20 2259   PRN Meds:.acetaminophen **OR** acetaminophen, promethazine   PHYSICAL EXAM: Vital signs: Vitals:   02/27/20 1725 02/27/20 2000 02/28/20 0008 02/28/20 0444  BP: 127/75 123/85 108/61 136/74  Pulse: (!) 128 (!) 117 (!) 101 (!) 109  Resp: 17 16 18 18   Temp: 98 F (36.7 C) 98.4 F (36.9 C) 98.4 F (36.9 C) 98.5 F (36.9 C)  TempSrc: Oral Oral Oral Oral  SpO2: 100% 100% 100% 100%  Weight:      Height:       Filed Weights   02/24/20 0811  Weight: 87.5 kg   Body mass index is 34.19 kg/m.   Gen Exam:Alert awake-not in any distress HEENT:atraumatic, normocephalic Chest: B/L clear to auscultation anteriorly CVS:S1S2 regular Abdomen:soft non tender, non distended Extremities:no edema Neurology: Non focal Skin: no rash I have personally reviewed following labs and imaging  studies  LABORATORY DATA: CBC: Recent Labs  Lab 02/21/20 1944 02/21/20 2122 02/22/20 0600 02/24/20 0036  WBC 11.1*  --  10.3 7.1  HGB 13.7 14.3 11.5* 12.0  HCT 43.0 42.0 36.8 35.6*  MCV 77.5*  --  79.7* 75.9*  PLT 403*  --  302 305    Basic Metabolic Panel: Recent Labs  Lab 02/21/20 2110 02/21/20 2122 02/22/20 0600 02/23/20 0658 02/24/20 0036 02/25/20 0317 02/27/20 0426  NA  --    < > 136 137 136 140 138  K  --    < > 3.1* 3.3* 3.0* 3.2* 4.5  CL  --   --  99 102 105 107 111  CO2  --   --  26 25 23 24  18*  GLUCOSE  --   --  201* 236* 187* 217* 224*  BUN  --   --  8 7 5* <5* 6  CREATININE  --   --  0.53 0.67 0.58 0.63 0.75  CALCIUM  --   --  8.5* 8.8* 8.6* 8.6* 8.7*  MG 1.9  --   --  1.9 1.9 2.1  --    < > = values in this interval not displayed.    GFR: Estimated Creatinine Clearance: 116.6 mL/min (by C-G formula based on SCr of 0.75 mg/dL).  Liver Function Tests: Recent Labs  Lab 02/21/20 1944  AST 23  ALT 15  ALKPHOS 86  BILITOT 1.2  PROT 7.0  ALBUMIN 4.1   Recent Labs  Lab 02/21/20 1944  LIPASE 30   No results for input(s): AMMONIA in the last 168 hours.  Coagulation Profile: No results for input(s): INR, PROTIME in the last 168 hours.  Cardiac Enzymes: No results for input(s): CKTOTAL, CKMB, CKMBINDEX, TROPONINI in the last 168 hours.  BNP (last 3 results) No results for input(s): PROBNP in the last 8760 hours.  Lipid Profile: No results for input(s): CHOL, HDL, LDLCALC, TRIG, CHOLHDL, LDLDIRECT in the last 72 hours.  Thyroid Function Tests: Recent Labs    02/27/20 1141  TSH 0.997    Anemia Panel: No results for input(s): VITAMINB12, FOLATE, FERRITIN, TIBC, IRON, RETICCTPCT in the last 72 hours.  Urine analysis:    Component Value Date/Time   COLORURINE YELLOW 02/21/2020 2348   APPEARANCEUR HAZY (A) 02/21/2020 2348   LABSPEC 1.032 (H) 02/21/2020 2348   PHURINE 7.0 02/21/2020 2348   GLUCOSEU >=500 (A) 02/21/2020 2348   HGBUR  SMALL (A) 02/21/2020 2348   BILIRUBINUR NEGATIVE 02/21/2020 2348   BILIRUBINUR negative 07/13/2018 1651   KETONESUR 80 (A) 02/21/2020 2348   PROTEINUR NEGATIVE 02/21/2020 2348   UROBILINOGEN 0.2 07/13/2018 1651   UROBILINOGEN 1.0 07/09/2017 1324   NITRITE NEGATIVE 02/21/2020 2348   LEUKOCYTESUR SMALL (A) 02/21/2020 2348    Sepsis Labs: Lactic Acid, Venous    Component Value Date/Time   LATICACIDVEN 2.4 (HH) 08/23/2019 1020    MICROBIOLOGY: Recent Results (from the past 240 hour(s))  Culture, Urine     Status: Abnormal   Collection Time: 02/22/20  4:24 AM   Specimen: Urine, Random  Result Value Ref Range Status   Specimen Description URINE, RANDOM  Final   Special Requests   Final    NONE Performed at [facility], [address], [place], [place] [address]    Culture >=100,000 COLONIES/mL ESCHERICHIA COLI (A)  Final   Report Status 02/24/2020 FINAL  Final   Organism ID, Bacteria ESCHERICHIA COLI (A)  Final      Susceptibility   Escherichia coli - MIC*    AMPICILLIN >=32 RESISTANT Resistant     CEFAZOLIN <=4 SENSITIVE Sensitive     CEFEPIME <=0.12 SENSITIVE Sensitive     CEFTRIAXONE <=0.25 SENSITIVE Sensitive     CIPROFLOXACIN <=0.25 SENSITIVE Sensitive     GENTAMICIN <=1 SENSITIVE Sensitive     IMIPENEM <=0.25 SENSITIVE Sensitive     NITROFURANTOIN <=16 SENSITIVE Sensitive     TRIMETH/SULFA <=20 SENSITIVE Sensitive     AMPICILLIN/SULBACTAM >=32 RESISTANT Resistant     PIP/TAZO <=4 SENSITIVE Sensitive     * >=100,000 COLONIES/mL ESCHERICHIA COLI  SARS CORONAVIRUS 2 (TAT 6-24 HRS) Nasopharyngeal Nasopharyngeal Swab     Status: None   Collection Time: 02/22/20 10:38 AM   Specimen: Nasopharyngeal Swab  Result Value Ref Range Status   SARS Coronavirus 2 NEGATIVE NEGATIVE Final    Comment: (NOTE) SARS-CoV-2  target nucleic acids are NOT DETECTED.  The SARS-CoV-2 RNA is generally detectable in upper and lower respiratory specimens during the acute phase of  infection. Negative results do not preclude SARS-CoV-2 infection, do not rule out co-infections with other pathogens, and should not be used as the sole basis for treatment or other patient management decisions. Negative results must be combined with clinical observations, patient history, and epidemiological information. The expected result is Negative.  Fact Sheet for Patients: HairSlick.no  Fact Sheet for Healthcare Providers: quierodirigir.com  This test is not yet approved or cleared by the [place] FDA and  has been authorized for detection and/or diagnosis of SARS-CoV-2 by FDA under an Emergency Use Authorization (EUA). This EUA will remain  in effect (meaning this test can be used) for the duration of the COVID-19 declaration under Se ction 564(b)(1) of the Act, 21 U.S.C. section 360bbb-3(b)(1), unless the authorization is terminated or revoked sooner.  Performed at [facility], [address], [place], [place] [address]     RADIOLOGY STUDIES/RESULTS: No results found.   LOS: 6 days   Jeoffrey Massed, MD  [facility]    To contact the attending provider between 7A-7P or the covering provider during after hours 7P-7A, please log into the web site [email or web address] and access using universal [place] password for that web site. If you do not have the password, please call the hospital operator.  02/28/2020, 10:47 AM

## 2020-02-28 NOTE — H&P (View-Only) (Signed)
Daily Rounding Note  02/28/2020, 10:12 AM  LOS: 6 days   SUBJECTIVE:   Chief complaint: Nausea, vomiting, abdominal pain.  Overall less frequent and less voluminous bilious vomiting.  Abdominal pain present but not severe.  Did not have any output after drinking, and vomiting some of, 4 L of Nulytely yesterday.  Is passing flatus.  OBJECTIVE:         Vital signs in last 24 hours:    Temp:  [98 F (36.7 C)-98.5 F (36.9 C)] 98.5 F (36.9 C) (02/22 0444) Pulse Rate:  [101-129] 109 (02/22 0444) Resp:  [16-18] 18 (02/22 0444) BP: (108-161)/(61-91) 136/74 (02/22 0444) SpO2:  [98 %-100 %] 100 % (02/22 0444) Last BM Date:  (mid January) Filed Weights   02/24/20 0811  Weight: 87.5 kg   General: Pleasant, calm, looks old for age. Heart: RRR Chest: Clear, no labored breathing Abdomen: Soft, obese.  Bowel sounds consist of rushing liquids coursing through the intestines.  No tinkling or high-pitched sounds.  Diffuse tenderness to mild/moderate pressure. Extremities: No CCE Neuro/Psych: Pleasant, fluid speech.  Fully oriented.  Does not seem depressed, anxious or histrionic.  Intake/Output from previous day: 02/21 0701 - 02/22 0700 In: 1265.9 [P.O.:240; I.V.:365; IV Piggyback:660.9] Out: -   Intake/Output this shift: No intake/output data recorded.  Lab Results: No results for input(s): WBC, HGB, HCT, PLT in the last 72 hours. BMET Recent Labs    02/27/20 0426  NA 138  K 4.5  CL 111  CO2 18*  GLUCOSE 224*  BUN 6  CREATININE 0.75  CALCIUM 8.7*   LFT No results for input(s): PROT, ALBUMIN, AST, ALT, ALKPHOS, BILITOT, BILIDIR, IBILI in the last 72 hours. PT/INR No results for input(s): LABPROT, INR in the last 72 hours. Hepatitis Panel No results for input(s): HEPBSAG, HCVAB, HEPAIGM, HEPBIGM in the last 72 hours.  Studies/Results: No results found.   Scheduled Meds: . ARIPiprazole  10 mg Oral Daily  .  atorvastatin  20 mg Oral Daily  . busPIRone  10 mg Oral BID  . capsaicin   Topical BID  . enoxaparin (LOVENOX) injection  40 mg Subcutaneous Daily  . insulin aspart  0-9 Units Subcutaneous TID WC  . insulin glargine  12 Units Subcutaneous Daily  . lamoTRIgine  50 mg Oral BID  . lubiprostone  24 mcg Oral BID WC  . metoCLOPramide (REGLAN) injection  5 mg Intravenous Q8H  . mirtazapine  7.5 mg Oral QHS  . ondansetron (ZOFRAN) IV  4 mg Intravenous TID AC  . pantoprazole  40 mg Oral Daily  . polyethylene glycol  17 g Oral BID  . propranolol  20 mg Oral BID  . QUEtiapine  25 mg Oral q morning  . QUEtiapine  300 mg Oral QHS  . senna-docusate  2 tablet Oral QHS  . topiramate  25 mg Oral BID  . vitamin B-12  1,000 mcg Oral Daily   Continuous Infusions: . erythromycin 250 mg (02/28/20 0901)  . lactated ringers 1,000 mL with potassium chloride 20 mEq infusion 75 mL/hr at 02/27/20 2259   PRN Meds:.acetaminophen **OR** acetaminophen, promethazine   ASSESMENT:   *   Acute on chronic, long-term nausea and vomiting, abdominal pain.  Multiple endoscopic and radiologic tests performed over the past few years Slightly improved but N/V persists.  This despite multiple antiemetics, promotility agents including erythromycin, Amitiza, scheduled low-dose IV Reglan, as needed Phenergan, Senokot, MiraLAX.   PLAN   *  Not sure what to do in this situation.  Symptoms have improved but not abated. I think as an outpatient she might benefit from referral to specialty clinic for motility disorders, despite the fact that she does not have gastroparesis based on gastric emptying study in 03/2019.  However the fact that she did not have any output after drinking 4 L of GoLYTELY is confounding.     Nancy [name]  02/28/2020, 10:12 AM Phone [phone] 

## 2020-02-28 NOTE — Progress Notes (Signed)
Daily Rounding Note  02/28/2020, 10:12 AM  LOS: 6 days   SUBJECTIVE:   Chief complaint: Nausea, vomiting, abdominal pain.  Overall less frequent and less voluminous bilious vomiting.  Abdominal pain present but not severe.  Did not have any output after drinking, and vomiting some of, 4 L of Nulytely yesterday.  Is passing flatus.  OBJECTIVE:         Vital signs in last 24 hours:    Temp:  [98 F (36.7 C)-98.5 F (36.9 C)] 98.5 F (36.9 C) (02/22 0444) Pulse Rate:  [101-129] 109 (02/22 0444) Resp:  [16-18] 18 (02/22 0444) BP: (108-161)/(61-91) 136/74 (02/22 0444) SpO2:  [98 %-100 %] 100 % (02/22 0444) Last BM Date:  (mid January) Filed Weights   02/24/20 0811  Weight: 87.5 kg   General: Pleasant, calm, looks old for age. Heart: RRR Chest: Clear, no labored breathing Abdomen: Soft, obese.  Bowel sounds consist of rushing liquids coursing through the intestines.  No tinkling or high-pitched sounds.  Diffuse tenderness to mild/moderate pressure. Extremities: No CCE Neuro/Psych: Pleasant, fluid speech.  Fully oriented.  Does not seem depressed, anxious or histrionic.  Intake/Output from previous day: 02/21 0701 - 02/22 0700 In: 1265.9 [P.O.:240; I.V.:365; IV Piggyback:660.9] Out: -   Intake/Output this shift: No intake/output data recorded.  Lab Results: No results for input(s): WBC, HGB, HCT, PLT in the last 72 hours. BMET Recent Labs    02/27/20 0426  NA 138  K 4.5  CL 111  CO2 18*  GLUCOSE 224*  BUN 6  CREATININE 0.75  CALCIUM 8.7*   LFT No results for input(s): PROT, ALBUMIN, AST, ALT, ALKPHOS, BILITOT, BILIDIR, IBILI in the last 72 hours. PT/INR No results for input(s): LABPROT, INR in the last 72 hours. Hepatitis Panel No results for input(s): HEPBSAG, HCVAB, HEPAIGM, HEPBIGM in the last 72 hours.  Studies/Results: No results found.   Scheduled Meds: . ARIPiprazole  10 mg Oral Daily  .  atorvastatin  20 mg Oral Daily  . busPIRone  10 mg Oral BID  . capsaicin   Topical BID  . enoxaparin (LOVENOX) injection  40 mg Subcutaneous Daily  . insulin aspart  0-9 Units Subcutaneous TID WC  . insulin glargine  12 Units Subcutaneous Daily  . lamoTRIgine  50 mg Oral BID  . lubiprostone  24 mcg Oral BID WC  . metoCLOPramide (REGLAN) injection  5 mg Intravenous Q8H  . mirtazapine  7.5 mg Oral QHS  . ondansetron (ZOFRAN) IV  4 mg Intravenous TID AC  . pantoprazole  40 mg Oral Daily  . polyethylene glycol  17 g Oral BID  . propranolol  20 mg Oral BID  . QUEtiapine  25 mg Oral q morning  . QUEtiapine  300 mg Oral QHS  . senna-docusate  2 tablet Oral QHS  . topiramate  25 mg Oral BID  . vitamin B-12  1,000 mcg Oral Daily   Continuous Infusions: . erythromycin 250 mg (02/28/20 0901)  . lactated ringers 1,000 mL with potassium chloride 20 mEq infusion 75 mL/hr at 02/27/20 2259   PRN Meds:.acetaminophen **OR** acetaminophen, promethazine   ASSESMENT:   *   Acute on chronic, long-term nausea and vomiting, abdominal pain.  Multiple endoscopic and radiologic tests performed over the past few years Slightly improved but N/V persists.  This despite multiple antiemetics, promotility agents including erythromycin, Amitiza, scheduled low-dose IV Reglan, as needed Phenergan, Senokot, MiraLAX.   PLAN   *  Not sure what to do in this situation.  Symptoms have improved but not abated. I think as an outpatient she might benefit from referral to specialty clinic for motility disorders, despite the fact that she does not have gastroparesis based on gastric emptying study in 03/2019.  However the fact that she did not have any output after drinking 4 L of GoLYTELY is confounding.     Nancy [name]  02/28/2020, 10:12 AM Phone [phone]

## 2020-02-28 NOTE — Op Note (Signed)
[facility] Patient Name: Nancy [name] Procedure Date : 02/28/2020 MRN: [id number] Attending MD: Doristine Locks , MD Date of Birth: 18-May-2000 CSN: [id number] Age: 20 Admit Type: Inpatient Procedure:                Upper GI endoscopy Indications:              Persistent nausea with vomiting Providers:                Doristine Locks, MD, Shelda Jakes, RN, Beryle Beams, Technician, Rosiland Oz, CRNA Referring MD:              Medicines:                Monitored Anesthesia Care Complications:            No immediate complications. Estimated Blood Loss:     Estimated blood loss was minimal. Procedure:                Pre-Anesthesia Assessment:                           - Prior to the procedure, a History and Physical                            was performed, and patient medications and                            allergies were reviewed. The patient's tolerance of                            previous anesthesia was also reviewed. The risks                            and benefits of the procedure and the sedation                            options and risks were discussed with the patient.                            All questions were answered, and informed consent                            was obtained. Prior Anticoagulants: The patient has                            taken no previous anticoagulant or antiplatelet                            agents. ASA Grade Assessment: III - A patient with                            severe systemic disease. After reviewing the risks  and benefits, the patient was deemed in                            satisfactory condition to undergo the procedure.                           After obtaining informed consent, the endoscope was                            passed under direct vision. Throughout the                            procedure, the patient's blood pressure, pulse, and                             oxygen saturations were monitored continuously. The                            GIF-H190 ([id number]) Olympus gastroscope was                            introduced through the mouth, and advanced to the                            second part of duodenum. The upper GI endoscopy was                            accomplished without difficulty. The patient                            tolerated the procedure well. Scope In: Scope Out: Findings:      Multiple small white plaques were found in the lower third of the       esophagus. Biopsies were taken with a cold forceps for histology.       Estimated blood loss was minimal.      The Z-line was regular and was found 40 cm from the incisors.      The upper third of the esophagus and middle third of the esophagus were       normal.      A small amount of food (residue) was found in the gastric body.      Normal mucosa was found in the entire examined stomach. Biopsies were       taken with a cold forceps for Helicobacter pylori testing. Estimated       blood loss was minimal. The pylorus was patent and easily traversed.      The examined duodenum was normal. Impression:               - Multiple small, white plaques in the lower third                            of the esophagus. Biopsied.                           - Z-line regular, 40 cm from the incisors.                           -  Normal upper third of esophagus and middle third                            of esophagus.                           - A small amount of food (residue) in the stomach.                           - Normal mucosa was found in the entire stomach.                            Biopsied.                           - Normal examined duodenum. Recommendation:           - Return patient to hospital ward for ongoing care                            after completion of endoscopic studies.                           - Full liquid diet to start then advance as                             tolerated to low fat, low fiber diet. Encourage 6                            small meals daily, to include liquid based meals                            for 1-2 (ie, Ensure, Boost, etc).                           - Continue present medications.                           - Await pathology results.                           - Flexible sigmoidoscopy today to evaluate acute on                            chronic diarrhea. Procedure Code(s):        --- Professional ---                           [phone], Esophagogastroduodenoscopy, flexible,                            transoral; with biopsy, single or multiple Diagnosis Code(s):        --- Professional ---                           K22.8, Other specified diseases of esophagus  R11.2, Nausea with vomiting, unspecified CPT copyright 2019 [employer]. All rights reserved. The codes documented in this report are preliminary and upon coder review may  be revised to meet current compliance requirements. Doristine Locks, MD 02/28/2020 2:05:04 PM Number of Addenda: 0

## 2020-02-28 NOTE — Interval H&P Note (Signed)
History and Physical Interval Note:  02/28/2020 1:02 PM  Nancy [name]  has presented today for surgery, with the diagnosis of Nausea, vomiting, severe obstipation, abdominal pain.  The various methods of treatment have been discussed with the patient and family. After consideration of risks, benefits and other options for treatment, the patient has consented to  Procedure(s): ESOPHAGOGASTRODUODENOSCOPY (EGD) WITH PROPOFOL (N/A) FLEXIBLE SIGMOIDOSCOPY (N/A) as a surgical intervention.  The patient's history has been reviewed, patient examined, no change in status, stable for surgery.  I have reviewed the patient's chart and labs.  Questions were answered to the patient's satisfaction.     Verlin Dike Tennile Styles

## 2020-02-28 NOTE — Op Note (Signed)
[facility] Patient Name: Nancy [name] Procedure Date : 02/28/2020 MRN: [id number] Attending MD: Doristine Locks , MD Date of Birth: 11-05-2000 CSN: [id number] Age: 20 Admit Type: Inpatient Procedure:                Flexible Sigmoidoscopy Indications:              Constipation Providers:                Doristine Locks, MD, Shelda Jakes, RN, Beryle Beams, Technician, Rosiland Oz, CRNA Referring MD:              Medicines:                Monitored Anesthesia Care Complications:            No immediate complications. Estimated Blood Loss:     Estimated blood loss: none. Procedure:                Pre-Anesthesia Assessment:                           - Prior to the procedure, a History and Physical                            was performed, and patient medications and                            allergies were reviewed. The patient's tolerance of                            previous anesthesia was also reviewed. The risks                            and benefits of the procedure and the sedation                            options and risks were discussed with the patient.                            All questions were answered, and informed consent                            was obtained. Prior Anticoagulants: The patient has                            taken no previous anticoagulant or antiplatelet                            agents. ASA Grade Assessment: III - A patient with                            severe systemic disease. After reviewing the risks  and benefits, the patient was deemed in                            satisfactory condition to undergo the procedure.                           After obtaining informed consent, the scope was                            passed under direct vision. The GIF-H190 ([id number])                            Olympus gastroscope was introduced through the anus                             and advanced to the the left transverse colon. The                            flexible sigmoidoscopy was accomplished without                            difficulty. The patient tolerated the procedure                            well. The quality of the bowel preparation was                            adequate. Scope In: Scope Out: Findings:      The perianal and digital rectal examinations were normal.      A moderate amount of semi-solid, soft stool was found scattered from the       rectosigmoid colon to the mid transverse colon. The endoscope was easily       maneuvered through to the mid transverse colon (distal extent reached).       No luminal dilation noted. The colon was then extensively lavaged using       copious amounts of tap water, resulting in clearance with adequate       visualization. An extensive amount of stool was removed using continued       lavage and insufflation. The endoscope was then reintroduced to the       distal transverse colon, which appeared significantly better.      The visualized mucosa was otherwise normal appearing from the rectum to       the mid transverse colon. Aside from mild mucosal irritation in the       rectum from the enema, there were no areas of mucosal erythema, edema,       erosions, or ulceration appreciated on this exam. Impression:               - Stool scattered from the rectosigmoid colon                            through to the mid transverse colon. This was  removed using copious irrigation and lavage, with                            significant stool debulking.                           - The visualized mucosa was otherwise normal                            appearing from the rectum to the mid transverse                            colon. Aside from mild mucosal irritation in the                            rectum from the enema, there were no areas of                            mucosal erythema,  edema, erosions, or ulceration                            appreciated.                           - No specimens collected. Recommendation:           - Return patient to hospital ward for possible                            discharge same day.                           - Continue present medications.                           - Continue Miralax twice daily to maintain soft                            stools, along with continued use of Amitiza, and                            Senna prn.                           - Will arrange for follow-up in the GI Clinic                            following hospital discharge, but also need to                            consider referral to an academic center motility                            clinic.                           -  Continue medications for nausea/vomiting as                            prescribed.                           - See EGD note for dietary recommendations. Procedure Code(s):        --- Professional ---                           [phone], Sigmoidoscopy, flexible; diagnostic,                            including collection of specimen(s) by brushing or                            washing, when performed (separate procedure) Diagnosis Code(s):        --- Professional ---                           K59.00, Constipation, unspecified CPT copyright 2019 [employer]. All rights reserved. The codes documented in this report are preliminary and upon coder review may  be revised to meet current compliance requirements. Doristine Locks, MD 02/28/2020 2:16:19 PM Number of Addenda: 0

## 2020-02-29 ENCOUNTER — Telehealth: Payer: Self-pay

## 2020-02-29 ENCOUNTER — Encounter (HOSPITAL_COMMUNITY): Payer: Self-pay | Admitting: Gastroenterology

## 2020-02-29 ENCOUNTER — Other Ambulatory Visit: Payer: Self-pay

## 2020-02-29 ENCOUNTER — Other Ambulatory Visit: Payer: Self-pay | Admitting: Internal Medicine

## 2020-02-29 DIAGNOSIS — R739 Hyperglycemia, unspecified: Secondary | ICD-10-CM

## 2020-02-29 DIAGNOSIS — R1084 Generalized abdominal pain: Secondary | ICD-10-CM | POA: Diagnosis not present

## 2020-02-29 DIAGNOSIS — R112 Nausea with vomiting, unspecified: Secondary | ICD-10-CM | POA: Diagnosis not present

## 2020-02-29 DIAGNOSIS — K5909 Other constipation: Secondary | ICD-10-CM | POA: Diagnosis not present

## 2020-02-29 LAB — COMPREHENSIVE METABOLIC PANEL
ALT: 25 U/L (ref 0–44)
AST: 26 U/L (ref 15–41)
Albumin: 3.2 g/dL — ABNORMAL LOW (ref 3.5–5.0)
Alkaline Phosphatase: 63 U/L (ref 38–126)
Anion gap: 9 (ref 5–15)
BUN: 7 mg/dL (ref 6–20)
CO2: 25 mmol/L (ref 22–32)
Calcium: 9 mg/dL (ref 8.9–10.3)
Chloride: 102 mmol/L (ref 98–111)
Creatinine, Ser: 0.77 mg/dL (ref 0.44–1.00)
GFR, Estimated: >60 mL/min (ref >60)
Glucose, Bld: 191 mg/dL — ABNORMAL HIGH (ref 70–99)
Potassium: 4.3 mmol/L (ref 3.5–5.1)
Sodium: 136 mmol/L (ref 135–145)
Total Bilirubin: 0.3 mg/dL (ref 0.3–1.2)
Total Protein: 6 g/dL — ABNORMAL LOW (ref 6.5–8.1)

## 2020-02-29 LAB — CBC
HCT: 33.3 % — ABNORMAL LOW (ref 36.0–46.0)
Hemoglobin: 11.2 g/dL — ABNORMAL LOW (ref 12.0–15.0)
MCH: 25.8 pg — ABNORMAL LOW (ref 26.0–34.0)
MCHC: 33.6 g/dL (ref 30.0–36.0)
MCV: 76.7 fL — ABNORMAL LOW (ref 80.0–100.0)
Platelets: 272 10*3/uL (ref 150–400)
RBC: 4.34 MIL/uL (ref 3.87–5.11)
RDW: 16.7 % — ABNORMAL HIGH (ref 11.5–15.5)
WBC: 8.7 10*3/uL (ref 4.0–10.5)
nRBC: 0 % (ref 0.0–0.2)

## 2020-02-29 LAB — GLUCOSE, CAPILLARY
Glucose-Capillary: 307 mg/dL — ABNORMAL HIGH (ref 70–99)
Glucose-Capillary: 242 mg/dL — ABNORMAL HIGH (ref 70–99)

## 2020-02-29 LAB — SURGICAL PATHOLOGY

## 2020-02-29 MED ORDER — INSULIN GLARGINE 100 UNIT/ML ~~LOC~~ SOLN
25.0000 [IU] | Freq: Every day | SUBCUTANEOUS | Status: DC
  Administered 2020-02-29: 25 [IU] via SUBCUTANEOUS
  Filled 2020-02-29: qty 0.25

## 2020-02-29 MED ORDER — INSULIN GLARGINE 100 UNITS/ML SOLOSTAR PEN: 30.0000 [IU] | PEN_INJECTOR | Freq: Every day | SUBCUTANEOUS | 0 refills | Status: DC

## 2020-02-29 MED ORDER — PROMETHAZINE HCL 25 MG RE SUPP: 25.0000 mg | Freq: Four times a day (QID) | RECTAL | 0 refills | Status: DC | PRN

## 2020-02-29 MED ORDER — SENNOSIDES-DOCUSATE SODIUM 8.6-50 MG PO TABS: 2.0000 | ORAL_TABLET | Freq: Every day | ORAL | 0 refills | Status: DC

## 2020-02-29 MED ORDER — PROCHLORPERAZINE 25 MG RE SUPP: 25.0000 mg | Freq: Two times a day (BID) | RECTAL | 0 refills | Status: DC | PRN

## 2020-02-29 MED ORDER — FLUCONAZOLE 100 MG PO TABS: ORAL_TABLET | ORAL | 0 refills | Status: AC

## 2020-02-29 MED ORDER — CAPSAICIN 0.025 % EX CREA: TOPICAL_CREAM | Freq: Two times a day (BID) | CUTANEOUS | 0 refills | Status: DC

## 2020-02-29 MED FILL — PROMETHAZINE HCL 25 MG SUPP: 25 | 3 days supply | Qty: 12 | Fill #0

## 2020-02-29 MED FILL — LANTUS SOLOSTAR 100 UNITS/M: 100 | 30 days supply | Qty: 9 | Fill #0

## 2020-02-29 NOTE — Discharge Summary (Addendum)
PATIENT DETAILS Name: Nancy [name] Age: 20 y.o. Sex: female Date of Birth: November 18, 2000 MRN: [id number]. Admitting Physician: Maretta Bees, MD [id number], Dorma Russell, MD  Admit Date: 02/21/2020 Discharge date: 02/29/2020  Recommendations for Outpatient Follow-up:  1. Follow up with PCP in 1-2 weeks 2. Please obtain CMP/CBC in one week 3. Ensure follow-up GI clinic-referral to [facility] motility center being arranged   Admitted From:  Home  Disposition: Home   Home Health: No  Equipment/Devices: None  Discharge Condition: Stable  CODE STATUS: FULL CODE  Diet recommendation:  Diet Order            Diet - low sodium heart healthy           Diet Carb Modified           DIET SOFT Room service appropriate? Yes; Fluid consistency: Thin  Diet effective now                  Brief Narrative: Patient is a 20 y.o. female DM-2, HTN, seizure disorder, gastroparesis, cyclic vomiting, marijuana use-multiple ER visits-presenting with abdominal pain/vomiting. See below for further details.  Significant events: 2/15>> presented to ED with abdominal pain/vomiting-admitted for further eval/treatment.  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  She was placed on a bowel regimen-subsequently given Dulcolax suppository/smog enema without any BM.  She was then given a gallon of GoLYTELY-this also resulted in no BM.  GI then performed a flexible sigmoidoscopy-and removed a significant amount of stools.  EGD was by and large unremarkable-possibly some candidiasis in  the lower esophagus-biopsy pending.  Thankfully-patient has had no further vomiting for the past 24 hours-continues to have some abdominal pain but now it is close to her baseline.  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  Sinus tachycardia: Much better-physiological response to stress-TSH stable. Continue to monitor in telemetry.  Asymptomatic bacteriuria: No role for antibiotics  HLD: Stable-continue statin  HTN: Continue propanolol  Hypokalemia: Repleted  History of seizures/pseudoseizures: Stable-continue Lamictal and Topamax  History of anxiety/depression: Stable-continue Seroquel, Remeron and BuSpar.  DM-1 (A1c 11.7 on 11/25/2019) with hypoglycemia: No further episodes of hypoglycemia-CBGs now on the higher side as vomiting has resolved-and tolerating diet.  Patient tells me that she was having hypoglycemic episodes at home with her home regimen of Lantus 70 units-hence will change Lantus to 30 units-and have her follow-up with PCP for further optimization.  Obesity: Estimated body mass index is 34.19 kg/m as calculated from the following:   Height as of this encounter: 5\' 3"  (1.6 m).   Weight as of this encounter: 87.5 kg.    COVID-19 Labs:  No results for input(s): DDIMER, FERRITIN, LDH, CRP in the last 72 hours.  Lab Results  Component Value Date   SARSCOV2NAA NEGATIVE 02/22/2020   SARSCOV2NAA NEGATIVE 01/14/2020   SARSCOV2NAA NEGATIVE 01/01/2020   SARSCOV2NAA NEGATIVE 11/25/2019     Obesity: Estimated body mass index is 34.38 kg/m as calculated from the following:  Height as of this encounter: $RemoveBefore EID_[id number]$5\' 3" Discharge  Diagnoses:  Principal Problem:   Intractable nausea and vomiting Active Problems:   Chest pain   Hyperglycemia   Abdominal pain   Chronic constipation   Nausea & vomiting   Discharge Instructions:Activity:  As tolerated   Discharge Instructions    Call MD for:  difficulty breathing, headache or visual disturbances   Complete by: As directed    Diet - low sodium heart healthy   Complete by: As directed    Diet Carb Modified   Complete by: As directed    Discharge instructions   Complete by: As directed    Follow with Primary MD  Fleet ContrasAvbuere, Edwin, MD in 1-2 weeks  Please get a complete blood count and chemistry panel checked by your Primary MD at your next visit, and again as instructed by your Primary MD.  Get Medicines reviewed and adjusted: Please take all your medications with you for your next visit with your Primary MD  Laboratory/radiological data: Please request your Primary MD to go over all hospital tests and procedure/radiological results at the follow up, please ask your Primary MD to get all Hospital records sent to his/her office.  In some cases, they will be blood work, cultures and biopsy results pending at the time of your discharge. Please request that your primary care M.D. follows up on these results.  Also Note the following: If you experience worsening of your admission symptoms, develop shortness of breath, life threatening emergency, suicidal or homicidal thoughts you must seek medical attention immediately by calling 911 or calling your MD immediately  if symptoms less severe.  You must read complete instructions/literature along with all the possible adverse reactions/side effects for all the Medicines you take and that have been prescribed to you. Take any new Medicines after you have completely understood and accpet all the possible adverse reactions/side effects.   Do not drive when taking Pain medications or sleeping medications  (Benzodaizepines)  Do not take more than prescribed Pain, Sleep and Anxiety Medications. It is not advisable to combine anxiety,sleep and pain medications without talking with your primary care practitioner  Special Instructions: If you have smoked or chewed Tobacco  in the last 2 yrs please stop smoking, stop any regular Alcohol  and or any Recreational drug use.  Wear Seat belts while driving.  Please note: You were cared for by a hospitalist during your hospital stay. Once you are discharged, your primary care physician will handle any further medical issues. Please note that NO REFILLS for any discharge medications will be authorized once you are discharged, as it is imperative that you return to your primary care physician (or establish a relationship with a primary care physician if you do not have one) for your post hospital discharge needs so that they can reassess your need for medications and monitor your lab values.   1.Continue with 6 small meals/day with low-fat/low fiber diet  2. Complete cessation of all marijuana   Increase activity slowly   Complete by: As directed      Allergies as of 02/29/2020      Reactions   Tomato Anaphylaxis   Ibuprofen Other (See Comments)   GI MD said to not take this anymore   Oatmeal Rash      Medication List    STOP taking these medications   amoxicillin 500 MG tablet Commonly known as:  AMOXIL   Ultram 50 MG tablet Generic drug: traMADol     TAKE these medications   ARIPiprazole 10 MG tablet Commonly known as: ABILIFY Take 10 mg by mouth in the morning and at bedtime.   atorvastatin 20 MG tablet Commonly known as: LIPITOR Take 1 tablet (20 mg total) by mouth daily at 6 PM. What changed: when to take this   busPIRone 10 MG tablet Commonly known as: BUSPAR Take 10 mg by mouth 2 (two) times daily.   capsaicin 0.025 % cream Commonly known as: ZOSTRIX Apply topically 2 (two) times daily.   diclofenac Sodium 1 % Gel Commonly  known as: VOLTAREN Apply 1 application topically 2 (two) times daily as needed (chest pains).   insulin glargine 100 unit/mL Sopn Commonly known as: LANTUS Inject 30 Units into the skin at bedtime. What changed: how much to take   lamoTRIgine 25 MG tablet Commonly known as: LAMICTAL Take 2 tablets (50 mg total) by mouth 2 (two) times daily. For mood stabilization   lidocaine 5 % Commonly known as: LIDODERM Place 1 patch onto the skin daily as needed (pain). Remove & Discard patch within 12 hours or as directed by MD   lubiprostone 24 MCG capsule Commonly known as: AMITIZA Take 1 capsule (24 mcg total) by mouth 2 (two) times daily with a meal. What changed: when to take this   metoCLOPramide 5 MG tablet Commonly known as: Reglan Take 1 tablet (5 mg total) by mouth 3 (three) times daily.   mirtazapine 7.5 MG tablet Commonly known as: REMERON Take 1 tablet (7.5 mg total) by mouth at bedtime. For depression/sleep   multivitamin with minerals Tabs tablet Take 1 tablet by mouth daily.   NovoLOG FlexPen 100 UNIT/ML FlexPen Generic drug: insulin aspart Inject 15 Units into the skin 2 (two) times daily before a meal.   pantoprazole 40 MG tablet Commonly known as: PROTONIX Take 1 tablet (40 mg total) by mouth 2 (two) times daily. For acid reflux   polyethylene glycol 17 g packet Commonly known as: MIRALAX / GLYCOLAX Take 17 g by mouth 2 (two) times daily.   prochlorperazine 25 MG suppository Commonly known as: COMPAZINE Place 1 suppository (25 mg total) rectally every 12 (twelve) hours as needed for nausea or vomiting.   propranolol 20 MG tablet Commonly known as: INDERAL Take 1 tablet (20 mg total) by mouth 2 (two) times daily. For anxiety/HTN   QUEtiapine 25 MG tablet Commonly known as: SEROQUEL Take 25 mg by mouth every morning.   QUEtiapine 100 MG tablet Commonly known as: SEROQUEL Take 300 mg by mouth at bedtime.   senna-docusate 8.6-50 MG tablet Commonly known  as: Senokot-S Take 2 tablets by mouth at bedtime. What changed:   how much to take  when to take this   sucralfate 1 GM/10ML suspension Commonly known as: CARAFATE Take 1 g by mouth at bedtime as needed (stomach pain).   topiramate 25 MG tablet Commonly known as: TOPAMAX Take 1 tablet (25 mg total) by mouth 2 (two) times daily. For seizure activities   VITAMIN B-12 PO Take 1 tablet by mouth daily.       Follow-up Information    Fleet Contras, MD. Schedule an appointment as soon as possible for a visit in 1 week(s).   Specialty: Internal Medicine Contact information: [address] [place] [address] [phone]        Pricilla Riffle, MD Follow up in 1 month(s).   Specialty: Cardiology Contact  information: [address] [place] [address] [phone]              Allergies  Allergen Reactions  . Tomato Anaphylaxis  . Ibuprofen Other (See Comments)    GI MD said to not take this anymore  . Oatmeal Rash     Other Procedures/Studies: DG Chest 1 View  Result Date: 02/19/2020 CLINICAL DATA:  Chest pain and shortness of breath EXAM: CHEST  1 VIEW COMPARISON:  February 15, 2020 FINDINGS: There is subtle ill-defined airspace opacity in the right lower lung region. The lungs elsewhere are clear. Heart size and pulmonary vascular normal. No adenopathy. No bone lesions. IMPRESSION: Subtle ill-defined opacity right lower lung region, concerning for developing pneumonia. This appearance warrants check of COVID-19 status. Lungs elsewhere clear. Heart size normal. Electronically Signed   By: Bretta Bang III M.D.   On: 02/19/2020 08:17   DG Chest 1 View  Result Date: 02/15/2020 CLINICAL DATA:  Chest pain EXAM: CHEST  1 VIEW COMPARISON:  01/01/2020 FINDINGS: Cardiomediastinal silhouette and pulmonary vasculature are within normal limits. Mild patchy opacities at the lung bases favored to be atelectasis. Lungs otherwise clear. IMPRESSION: Mild  bibasilar atelectasis. Electronically Signed   By: Acquanetta Belling M.D.   On: 02/15/2020 07:41   DG Abd 1 View  Result Date: 02/22/2020 CLINICAL DATA:  Abdominal pain EXAM: ABDOMEN - 1 VIEW COMPARISON:  01/14/2020 FINDINGS: The bowel gas pattern is normal. No radio-opaque calculi or other significant radiographic abnormality are seen. IMPRESSION: Negative. Electronically Signed   By: Deatra Robinson M.D.   On: 02/22/2020 00:44   DG Chest Portable 1 View  Result Date: 02/21/2020 CLINICAL DATA:  Vomiting. EXAM: PORTABLE CHEST 1 VIEW COMPARISON:  February 19, 2020 FINDINGS: The heart size and mediastinal contours are within normal limits. Both lungs are clear. The visualized skeletal structures are unremarkable. IMPRESSION: No active disease. Electronically Signed   By: Aram Candela M.D.   On: 02/21/2020 21:12     TODAY-DAY OF DISCHARGE:  Subjective:   Nancy [name] today has no headache,no chest abdominal pain,no new weakness tingling or numbness, feels much better wants to go home today.   Objective:   Blood pressure 115/73, pulse (!) 114, temperature 98.9 F (37.2 C), temperature source Oral, resp. rate 20, height 5\' 3"  (1.6 m), weight 88 kg, SpO2 100 %.  Intake/Output Summary (Last 24 hours) at 02/29/2020 1049 Last data filed at 02/29/2020 0200 Gross per 24 hour  Intake 1477.94 ml  Output --  Net 1477.94 ml   Filed Weights   02/24/20 0811 02/28/20 1239 02/29/20 0511  Weight: 87.5 kg 88.5 kg 88 kg    Exam: Awake Alert, Oriented *3, No new F.N deficits, Normal affect [place].AT,PERRAL Supple Neck,No JVD, No cervical lymphadenopathy appriciated.  Symmetrical Chest wall movement, Good air movement bilaterally, CTAB RRR,No Gallops,Rubs or new Murmurs, No Parasternal Heave +ve B.Sounds, Abd Soft, Non tender, No organomegaly appriciated, No rebound -guarding or rigidity. No Cyanosis, Clubbing or edema, No new Rash or bruise   PERTINENT RADIOLOGIC STUDIES: DG Chest 1 View  Result  Date: 02/19/2020 CLINICAL DATA:  Chest pain and shortness of breath EXAM: CHEST  1 VIEW COMPARISON:  February 15, 2020 FINDINGS: There is subtle ill-defined airspace opacity in the right lower lung region. The lungs elsewhere are clear. Heart size and pulmonary vascular normal. No adenopathy. No bone lesions. IMPRESSION: Subtle ill-defined opacity right lower lung region, concerning for developing pneumonia. This appearance warrants check of  COVID-19 status. Lungs elsewhere clear. Heart size normal. Electronically Signed   By: Bretta Bang III M.D.   On: 02/19/2020 08:17   DG Chest 1 View  Result Date: 02/15/2020 CLINICAL DATA:  Chest pain EXAM: CHEST  1 VIEW COMPARISON:  01/01/2020 FINDINGS: Cardiomediastinal silhouette and pulmonary vasculature are within normal limits. Mild patchy opacities at the lung bases favored to be atelectasis. Lungs otherwise clear. IMPRESSION: Mild bibasilar atelectasis. Electronically Signed   By: Acquanetta Belling M.D.   On: 02/15/2020 07:41   DG Abd 1 View  Result Date: 02/22/2020 CLINICAL DATA:  Abdominal pain EXAM: ABDOMEN - 1 VIEW COMPARISON:  01/14/2020 FINDINGS: The bowel gas pattern is normal. No radio-opaque calculi or other significant radiographic abnormality are seen. IMPRESSION: Negative. Electronically Signed   By: Deatra Robinson M.D.   On: 02/22/2020 00:44   DG Chest Portable 1 View  Result Date: 02/21/2020 CLINICAL DATA:  Vomiting. EXAM: PORTABLE CHEST 1 VIEW COMPARISON:  February 19, 2020 FINDINGS: The heart size and mediastinal contours are within normal limits. Both lungs are clear. The visualized skeletal structures are unremarkable. IMPRESSION: No active disease. Electronically Signed   By: Aram Candela M.D.   On: 02/21/2020 21:12     PERTINENT LAB RESULTS: CBC: Recent Labs    02/29/20 0520  WBC 8.7  HGB 11.2*  HCT 33.3*  PLT 272   CMET CMP     Component Value Date/Time   NA 136 02/29/2020 0520   NA 139 10/05/2018 1133   K 4.3  02/29/2020 0520   CL 102 02/29/2020 0520   CO2 25 02/29/2020 0520   GLUCOSE 191 (H) 02/29/2020 0520   BUN 7 02/29/2020 0520   BUN 13 10/05/2018 1133   CREATININE 0.77 02/29/2020 0520   CREATININE 0.54 09/18/2012 1150   CALCIUM 9.0 02/29/2020 0520   PROT 6.0 (L) 02/29/2020 0520   PROT 7.2 10/05/2018 1133   ALBUMIN 3.2 (L) 02/29/2020 0520   ALBUMIN 4.7 10/05/2018 1133   AST 26 02/29/2020 0520   ALT 25 02/29/2020 0520   ALKPHOS 63 02/29/2020 0520   BILITOT 0.3 02/29/2020 0520   BILITOT <0.2 10/05/2018 1133   GFRNONAA >60 02/29/2020 0520   GFRAA >60 10/03/2019 0523    GFR Estimated Creatinine Clearance: 117 mL/min (by C-G formula based on SCr of 0.77 mg/dL). No results for input(s): LIPASE, AMYLASE in the last 72 hours. No results for input(s): CKTOTAL, CKMB, CKMBINDEX, TROPONINI in the last 72 hours. Invalid input(s): POCBNP No results for input(s): DDIMER in the last 72 hours. No results for input(s): HGBA1C in the last 72 hours. No results for input(s): CHOL, HDL, LDLCALC, TRIG, CHOLHDL, LDLDIRECT in the last 72 hours. Recent Labs    02/27/20 1141  TSH 0.997   No results for input(s): VITAMINB12, FOLATE, FERRITIN, TIBC, IRON, RETICCTPCT in the last 72 hours. Coags: No results for input(s): INR in the last 72 hours.  Invalid input(s): PT Microbiology: Recent Results (from the past 240 hour(s))  Culture, Urine     Status: Abnormal   Collection Time: 02/22/20  4:24 AM   Specimen: Urine, Random  Result Value Ref Range Status   Specimen Description URINE, RANDOM  Final   Special Requests   Final    NONE Performed at [facility], [address], [place], [place] [address]    Culture >=100,000 COLONIES/mL ESCHERICHIA COLI (A)  Final   Report Status 02/24/2020 FINAL  Final   Organism ID, Bacteria ESCHERICHIA COLI (A)  Final      Susceptibility   Escherichia coli - MIC*    AMPICILLIN >=32 RESISTANT Resistant     CEFAZOLIN <=4 SENSITIVE Sensitive     CEFEPIME  <=0.12 SENSITIVE Sensitive     CEFTRIAXONE <=0.25 SENSITIVE Sensitive     CIPROFLOXACIN <=0.25 SENSITIVE Sensitive     GENTAMICIN <=1 SENSITIVE Sensitive     IMIPENEM <=0.25 SENSITIVE Sensitive     NITROFURANTOIN <=16 SENSITIVE Sensitive     TRIMETH/SULFA <=20 SENSITIVE Sensitive     AMPICILLIN/SULBACTAM >=32 RESISTANT Resistant     PIP/TAZO <=4 SENSITIVE Sensitive     * >=100,000 COLONIES/mL ESCHERICHIA COLI  SARS CORONAVIRUS 2 (TAT 6-24 HRS) Nasopharyngeal Nasopharyngeal Swab     Status: None   Collection Time: 02/22/20 10:38 AM   Specimen: Nasopharyngeal Swab  Result Value Ref Range Status   SARS Coronavirus 2 NEGATIVE NEGATIVE Final    Comment: (NOTE) SARS-CoV-2 target nucleic acids are NOT DETECTED.  The SARS-CoV-2 RNA is generally detectable in upper and lower respiratory specimens during the acute phase of infection. Negative results do not preclude SARS-CoV-2 infection, do not rule out co-infections with other pathogens, and should not be used as the sole basis for treatment or other patient management decisions. Negative results must be combined with clinical observations, patient history, and epidemiological information. The expected result is Negative.  Fact Sheet for Patients: HairSlick.no  Fact Sheet for Healthcare Providers: quierodirigir.com  This test is not yet approved or cleared by the [place] FDA and  has been authorized for detection and/or diagnosis of SARS-CoV-2 by FDA under an Emergency Use Authorization (EUA). This EUA will remain  in effect (meaning this test can be used) for the duration of the COVID-19 declaration under Se ction 564(b)(1) of the Act, 21 U.S.C. section 360bbb-3(b)(1), unless the authorization is terminated or revoked sooner.  Performed at [facility], [address] [place], [place] [address]     FURTHER DISCHARGE INSTRUCTIONS:  Get Medicines reviewed and  adjusted: Please take all your medications with you for your next visit with your Primary MD  Laboratory/radiological data: Please request your Primary MD to go over all hospital tests and procedure/radiological results at the follow up, please ask your Primary MD to get all Hospital records sent to his/her office.  In some cases, they will be blood work, cultures and biopsy results pending at the time of your discharge. Please request that your primary care M.D. goes through all the records of your hospital data and follows up on these results.  Also Note the following: If you experience worsening of your admission symptoms, develop shortness of breath, life threatening emergency, suicidal or homicidal thoughts you must seek medical attention immediately by calling 911 or calling your MD immediately  if symptoms less severe.  You must read complete instructions/literature along with all the possible adverse reactions/side effects for all the Medicines you take and that have been prescribed to you. Take any new Medicines after you have completely understood and accpet all the possible adverse reactions/side effects.   Do not drive when taking Pain medications or sleeping medications (Benzodaizepines)  Do not take more than prescribed Pain, Sleep and Anxiety Medications. It is not advisable to combine anxiety,sleep and pain medications without talking with your primary care practitioner  Special Instructions: If you have smoked or chewed Tobacco  in the last 2 yrs please stop smoking, stop any regular Alcohol  and or any Recreational drug use.  Wear Seat belts  while driving.  Please note: You were cared for by a hospitalist during your hospital stay. Once you are discharged, your primary care physician will handle any further medical issues. Please note that NO REFILLS for any discharge medications will be authorized once you are discharged, as it is imperative that you return to your primary  care physician (or establish a relationship with a primary care physician if you do not have one) for your post hospital discharge needs so that they can reassess your need for medications and monitor your lab values.  Total Time spent coordinating discharge including counseling, education and face to face time equals 35 minutes.  SignedJeoffrey Massed 02/29/2020 10:49 AM

## 2020-02-29 NOTE — Progress Notes (Signed)
Nsg Discharge Note  Medications dropped off to patient. Mother providing transportation home. Discharge instructions complete and doctor's note given to patient.  Admit Date:  02/21/2020 Discharge date: 02/29/2020   [name] to be D/C'd Home per MD order.  AVS completed.  Copy for chart, and copy for patient signed, and dated. Patient/caregiver able to verbalize understanding.  Discharge Medication: Allergies as of 02/29/2020      Reactions   Tomato Anaphylaxis   Ibuprofen Other (See Comments)   GI MD said to not take this anymore   Oatmeal Rash      Medication List    STOP taking these medications   amoxicillin 500 MG tablet Commonly known as: AMOXIL   Ultram 50 MG tablet Generic drug: traMADol     TAKE these medications   ARIPiprazole 10 MG tablet Commonly known as: ABILIFY Take 10 mg by mouth in the morning and at bedtime.   atorvastatin 20 MG tablet Commonly known as: LIPITOR Take 1 tablet (20 mg total) by mouth daily at 6 PM. What changed: when to take this   busPIRone 10 MG tablet Commonly known as: BUSPAR Take 10 mg by mouth 2 (two) times daily.   capsaicin 0.025 % cream Commonly known as: ZOSTRIX Apply topically 2 (two) times daily.   diclofenac Sodium 1 % Gel Commonly known as: VOLTAREN Apply 1 application topically 2 (two) times daily as needed (chest pains).   insulin glargine 100 unit/mL Sopn Commonly known as: LANTUS Inject 30 Units into the skin at bedtime. What changed: how much to take   lamoTRIgine 25 MG tablet Commonly known as: LAMICTAL Take 2 tablets (50 mg total) by mouth 2 (two) times daily. For mood stabilization   lidocaine 5 % Commonly known as: LIDODERM Place 1 patch onto the skin daily as needed (pain). Remove & Discard patch within 12 hours or as directed by MD   lubiprostone 24 MCG capsule Commonly known as: AMITIZA Take 1 capsule (24 mcg total) by mouth 2 (two) times daily with a meal. What changed: when to take  this   metoCLOPramide 5 MG tablet Commonly known as: Reglan Take 1 tablet (5 mg total) by mouth 3 (three) times daily.   mirtazapine 7.5 MG tablet Commonly known as: REMERON Take 1 tablet (7.5 mg total) by mouth at bedtime. For depression/sleep   multivitamin with minerals Tabs tablet Take 1 tablet by mouth daily.   NovoLOG FlexPen 100 UNIT/ML FlexPen Generic drug: insulin aspart Inject 15 Units into the skin 2 (two) times daily before a meal.   pantoprazole 40 MG tablet Commonly known as: PROTONIX Take 1 tablet (40 mg total) by mouth 2 (two) times daily. For acid reflux   polyethylene glycol 17 g packet Commonly known as: MIRALAX / GLYCOLAX Take 17 g by mouth 2 (two) times daily.   prochlorperazine 25 MG suppository Commonly known as: COMPAZINE Place 1 suppository (25 mg total) rectally every 12 (twelve) hours as needed for nausea or vomiting.   propranolol 20 MG tablet Commonly known as: INDERAL Take 1 tablet (20 mg total) by mouth 2 (two) times daily. For anxiety/HTN   QUEtiapine 25 MG tablet Commonly known as: SEROQUEL Take 25 mg by mouth every morning.   QUEtiapine 100 MG tablet Commonly known as: SEROQUEL Take 300 mg by mouth at bedtime.   senna-docusate 8.6-50 MG tablet Commonly known as: Senokot-S Take 2 tablets by mouth at bedtime. What changed:   how much to take  when to take  this   sucralfate 1 GM/10ML suspension Commonly known as: CARAFATE Take 1 g by mouth at bedtime as needed (stomach pain).   topiramate 25 MG tablet Commonly known as: TOPAMAX Take 1 tablet (25 mg total) by mouth 2 (two) times daily. For seizure activities   VITAMIN B-12 PO Take 1 tablet by mouth daily.       Discharge Assessment: Vitals:   02/29/20 0511 02/29/20 1033  BP: (!) 142/88 115/73  Pulse: (!) 105 (!) 114  Resp: 20   Temp: 98.9 F (37.2 C)   SpO2: 100%    Skin clean, dry and intact without evidence of skin break down, no evidence of skin tears noted. IV  catheter discontinued intact. Site without signs and symptoms of complications - no redness or edema noted at insertion site, patient denies c/o pain - only slight tenderness at site.  Dressing with slight pressure applied.  D/c Instructions-Education: Discharge instructions given to patient/family with verbalized understanding. D/c education completed with patient/family including follow up instructions, medication list, d/c activities limitations if indicated, with other d/c instructions as indicated by MD - patient able to verbalize understanding, all questions fully answered. Patient instructed to return to ED, call 911, or call MD for any changes in condition.  Patient escorted via WC, and D/C home via private auto.  Boykin Nearing, RN 02/29/2020 11:21 AM  Nsg Discharge Note  Admit Date:  02/21/2020 Discharge date: 02/29/2020   [name] to be D/C'd Home per MD order.  AVS completed.  Copy for chart, and copy for patient signed, and dated. Patient/caregiver able to verbalize understanding.  Discharge Medication: Allergies as of 02/29/2020      Reactions   Tomato Anaphylaxis   Ibuprofen Other (See Comments)   GI MD said to not take this anymore   Oatmeal Rash      Medication List    STOP taking these medications   amoxicillin 500 MG tablet Commonly known as: AMOXIL   Ultram 50 MG tablet Generic drug: traMADol     TAKE these medications   ARIPiprazole 10 MG tablet Commonly known as: ABILIFY Take 10 mg by mouth in the morning and at bedtime.   atorvastatin 20 MG tablet Commonly known as: LIPITOR Take 1 tablet (20 mg total) by mouth daily at 6 PM. What changed: when to take this   busPIRone 10 MG tablet Commonly known as: BUSPAR Take 10 mg by mouth 2 (two) times daily.   capsaicin 0.025 % cream Commonly known as: ZOSTRIX Apply topically 2 (two) times daily.   diclofenac Sodium 1 % Gel Commonly known as: VOLTAREN Apply 1 application topically 2 (two)  times daily as needed (chest pains).   insulin glargine 100 unit/mL Sopn Commonly known as: LANTUS Inject 30 Units into the skin at bedtime. What changed: how much to take   lamoTRIgine 25 MG tablet Commonly known as: LAMICTAL Take 2 tablets (50 mg total) by mouth 2 (two) times daily. For mood stabilization   lidocaine 5 % Commonly known as: LIDODERM Place 1 patch onto the skin daily as needed (pain). Remove & Discard patch within 12 hours or as directed by MD   lubiprostone 24 MCG capsule Commonly known as: AMITIZA Take 1 capsule (24 mcg total) by mouth 2 (two) times daily with a meal. What changed: when to take this   metoCLOPramide 5 MG tablet Commonly known as: Reglan Take 1 tablet (5 mg total) by mouth 3 (three) times daily.   mirtazapine  7.5 MG tablet Commonly known as: REMERON Take 1 tablet (7.5 mg total) by mouth at bedtime. For depression/sleep   multivitamin with minerals Tabs tablet Take 1 tablet by mouth daily.   NovoLOG FlexPen 100 UNIT/ML FlexPen Generic drug: insulin aspart Inject 15 Units into the skin 2 (two) times daily before a meal.   pantoprazole 40 MG tablet Commonly known as: PROTONIX Take 1 tablet (40 mg total) by mouth 2 (two) times daily. For acid reflux   polyethylene glycol 17 g packet Commonly known as: MIRALAX / GLYCOLAX Take 17 g by mouth 2 (two) times daily.   prochlorperazine 25 MG suppository Commonly known as: COMPAZINE Place 1 suppository (25 mg total) rectally every 12 (twelve) hours as needed for nausea or vomiting.   propranolol 20 MG tablet Commonly known as: INDERAL Take 1 tablet (20 mg total) by mouth 2 (two) times daily. For anxiety/HTN   QUEtiapine 25 MG tablet Commonly known as: SEROQUEL Take 25 mg by mouth every morning.   QUEtiapine 100 MG tablet Commonly known as: SEROQUEL Take 300 mg by mouth at bedtime.   senna-docusate 8.6-50 MG tablet Commonly known as: Senokot-S Take 2 tablets by mouth at bedtime. What  changed:   how much to take  when to take this   sucralfate 1 GM/10ML suspension Commonly known as: CARAFATE Take 1 g by mouth at bedtime as needed (stomach pain).   topiramate 25 MG tablet Commonly known as: TOPAMAX Take 1 tablet (25 mg total) by mouth 2 (two) times daily. For seizure activities   VITAMIN B-12 PO Take 1 tablet by mouth daily.       Discharge Assessment: Vitals:   02/29/20 0511 02/29/20 1033  BP: (!) 142/88 115/73  Pulse: (!) 105 (!) 114  Resp: 20   Temp: 98.9 F (37.2 C)   SpO2: 100%    Skin clean, dry and intact without evidence of skin break down, no evidence of skin tears noted. IV catheter discontinued intact. Site without signs and symptoms of complications - no redness or edema noted at insertion site, patient denies c/o pain - only slight tenderness at site.  Dressing with slight pressure applied.  D/c Instructions-Education: Discharge instructions given to patient/family with verbalized understanding. D/c education completed with patient/family including follow up instructions, medication list, d/c activities limitations if indicated, with other d/c instructions as indicated by MD - patient able to verbalize understanding, all questions fully answered. Patient instructed to return to ED, call 911, or call MD for any changes in condition.  Patient escorted via WC, and D/C home via private auto.  Boykin Nearing, RN 02/29/2020 11:21 AM

## 2020-02-29 NOTE — Progress Notes (Signed)
[facility] ROUNDING NOTE   Subjective: EGD/flexible sigmoidoscopy completed yesterday.  No acute events overnight.  Feeling better today and eager for discharge to home.  Tolerating p.o.   Objective: Vital signs in last 24 hours: Temp:  [97.7 F (36.5 C)-98.9 F (37.2 C)] 98.9 F (37.2 C) (02/23 0511) Pulse Rate:  [90-105] 105 (02/23 0511) Resp:  [17-21] 20 (02/23 0511) BP: (104-142)/(54-90) 142/88 (02/23 0511) SpO2:  [98 %-100 %] 100 % (02/23 0511) Weight:  [88 kg-88.5 kg] 88 kg (02/23 0511) Last BM Date:  (mid January) General: NAD   Intake/Output from previous day: 02/22 0701 - 02/23 0700 In: 1477.9 [P.O.:240; I.V.:1237.9] Out: -  Intake/Output this shift: No intake/output data recorded.   Lab Results: Recent Labs    02/29/20 0520  WBC 8.7  HGB 11.2*  PLT 272  MCV 76.7*   BMET Recent Labs    02/27/20 0426 02/29/20 0520  NA 138 136  K 4.5 4.3  CL 111 102  CO2 18* 25  GLUCOSE 224* 191*  BUN 6 7  CREATININE 0.75 0.77  CALCIUM 8.7* 9.0   LFT Recent Labs    02/29/20 0520  PROT 6.0*  ALBUMIN 3.2*  AST 26  ALT 25  ALKPHOS 63  BILITOT 0.3   PT/INR No results for input(s): INR in the last 72 hours.    Imaging/Other results: No results found.    Assessment and Plan:  1) Nausea/Vomiting -Continue with 6 small meals/day with low-fat/low fiber diet -Use boost, Ensure, etc (low/no sugar) as meal replacement -Referral to [facility] -Complete cessation of all marijuana  2) Chronic constipation -Continue MiraLAX daily along with Amitiza -Reports she previously failed Motegrity, but cannot elaborate on that further today.  Otherwise, Motegrity would actually be a pretty good agent for her given presumed slow transit, as this can also offset gastric motility issues  3) Diabetes -Encouraged close follow-up with her PCM (A1c 11.7)  -Okay for discharge today from GI standpoint    Shellia Cleverly, DO   02/29/2020, 10:10 AM [facility] Pager [phone]

## 2020-02-29 NOTE — Telephone Encounter (Signed)
Per staff message from Dr. Adela Lank:   Sharol Harness can you please start a referral to [facility] for refractory nausea / vomiting?  Thanks    - Referral, records, demographic and insurance information faxed to [facility] at [phone] location per pt request). Patient is aware that referral has been faxed and had no concerns at the end of the call.

## 2020-03-06 NOTE — Telephone Encounter (Signed)
Called to follow up on referral, patient has not been scheduled yet but is on the list to be called for scheduling. Will follow up at a later date.

## 2020-03-13 NOTE — Telephone Encounter (Signed)
Spoke with Renee at [facility], patient was offered an appt for 03/21/20 which patient declined. They are currently booked but have her on a recall list to reach out to her when they have more appointments available.

## 2020-04-10 ENCOUNTER — Emergency Department (HOSPITAL_COMMUNITY)
Admission: EM | Admit: 2020-04-10 | Discharge: 2020-04-10 | Disposition: A | Discharge status: Home / Self Care | Payer: Medicaid Other | Attending: Emergency Medicine | Admitting: Emergency Medicine

## 2020-04-10 ENCOUNTER — Emergency Department (HOSPITAL_COMMUNITY): Payer: Medicaid Other

## 2020-04-10 ENCOUNTER — Other Ambulatory Visit: Payer: Self-pay

## 2020-04-10 DIAGNOSIS — I639 Cerebral infarction, unspecified: Secondary | ICD-10-CM | POA: Insufficient documentation

## 2020-04-10 DIAGNOSIS — Z794 Long term (current) use of insulin: Secondary | ICD-10-CM | POA: Diagnosis not present

## 2020-04-10 DIAGNOSIS — I509 Heart failure, unspecified: Secondary | ICD-10-CM | POA: Diagnosis not present

## 2020-04-10 DIAGNOSIS — R4182 Altered mental status, unspecified: Secondary | ICD-10-CM | POA: Insufficient documentation

## 2020-04-10 DIAGNOSIS — R569 Unspecified convulsions: Secondary | ICD-10-CM

## 2020-04-10 DIAGNOSIS — I110 Hypertensive heart disease with heart failure: Secondary | ICD-10-CM | POA: Insufficient documentation

## 2020-04-10 DIAGNOSIS — R109 Unspecified abdominal pain: Secondary | ICD-10-CM | POA: Insufficient documentation

## 2020-04-10 DIAGNOSIS — R4781 Slurred speech: Secondary | ICD-10-CM | POA: Diagnosis not present

## 2020-04-10 DIAGNOSIS — Z20822 Contact with and (suspected) exposure to covid-19: Secondary | ICD-10-CM | POA: Diagnosis not present

## 2020-04-10 DIAGNOSIS — R52 Pain, unspecified: Secondary | ICD-10-CM

## 2020-04-10 DIAGNOSIS — R079 Chest pain, unspecified: Secondary | ICD-10-CM | POA: Diagnosis not present

## 2020-04-10 DIAGNOSIS — R739 Hyperglycemia, unspecified: Secondary | ICD-10-CM

## 2020-04-10 DIAGNOSIS — R519 Headache, unspecified: Secondary | ICD-10-CM | POA: Insufficient documentation

## 2020-04-10 DIAGNOSIS — R4789 Other speech disturbances: Secondary | ICD-10-CM

## 2020-04-10 DIAGNOSIS — R2990 Unspecified symptoms and signs involving the nervous system: Secondary | ICD-10-CM | POA: Diagnosis not present

## 2020-04-10 DIAGNOSIS — E1165 Type 2 diabetes mellitus with hyperglycemia: Secondary | ICD-10-CM | POA: Insufficient documentation

## 2020-04-10 DIAGNOSIS — R464 Slowness and poor responsiveness: Secondary | ICD-10-CM | POA: Diagnosis not present

## 2020-04-10 DIAGNOSIS — R4189 Other symptoms and signs involving cognitive functions and awareness: Secondary | ICD-10-CM

## 2020-04-10 LAB — CBG MONITORING, ED
Glucose-Capillary: 357 mg/dL — ABNORMAL HIGH (ref 70–99)
Glucose-Capillary: 247 mg/dL — ABNORMAL HIGH (ref 70–99)

## 2020-04-10 LAB — CBC
HCT: 41.7 % (ref 36.0–46.0)
Hemoglobin: 13.3 g/dL (ref 12.0–15.0)
MCH: 25 pg — ABNORMAL LOW (ref 26.0–34.0)
MCHC: 31.9 g/dL (ref 30.0–36.0)
MCV: 78.2 fL — ABNORMAL LOW (ref 80.0–100.0)
Platelets: 333 10*3/uL (ref 150–400)
RBC: 5.33 MIL/uL — ABNORMAL HIGH (ref 3.87–5.11)
RDW: 15.7 % — ABNORMAL HIGH (ref 11.5–15.5)
WBC: 13.4 10*3/uL — ABNORMAL HIGH (ref 4.0–10.5)
nRBC: 0 % (ref 0.0–0.2)

## 2020-04-10 LAB — BASIC METABOLIC PANEL
Anion gap: 11 (ref 5–15)
BUN: <5 mg/dL — ABNORMAL LOW (ref 6–20)
CO2: 24 mmol/L (ref 22–32)
Calcium: 9.1 mg/dL (ref 8.9–10.3)
Chloride: 99 mmol/L (ref 98–111)
Creatinine, Ser: 0.43 mg/dL — ABNORMAL LOW (ref 0.44–1.00)
GFR, Estimated: >60 mL/min (ref >60)
Glucose, Bld: 275 mg/dL — ABNORMAL HIGH (ref 70–99)
Potassium: 3.6 mmol/L (ref 3.5–5.1)
Sodium: 134 mmol/L — ABNORMAL LOW (ref 135–145)

## 2020-04-10 LAB — COMPREHENSIVE METABOLIC PANEL
ALT: 13 U/L (ref 0–44)
AST: 15 U/L (ref 15–41)
Albumin: 4 g/dL (ref 3.5–5.0)
Alkaline Phosphatase: 83 U/L (ref 38–126)
Anion gap: 12 (ref 5–15)
BUN: <5 mg/dL — ABNORMAL LOW (ref 6–20)
CO2: 25 mmol/L (ref 22–32)
Calcium: 9.4 mg/dL (ref 8.9–10.3)
Chloride: 95 mmol/L — ABNORMAL LOW (ref 98–111)
Creatinine, Ser: 0.71 mg/dL (ref 0.44–1.00)
GFR, Estimated: >60 mL/min (ref >60)
Glucose, Bld: 391 mg/dL — ABNORMAL HIGH (ref 70–99)
Potassium: 3.4 mmol/L — ABNORMAL LOW (ref 3.5–5.1)
Sodium: 132 mmol/L — ABNORMAL LOW (ref 135–145)
Total Bilirubin: 1 mg/dL (ref 0.3–1.2)
Total Protein: 7.7 g/dL (ref 6.5–8.1)

## 2020-04-10 LAB — DIFFERENTIAL
Abs Immature Granulocytes: 0.05 10*3/uL (ref 0.00–0.07)
Basophils Absolute: 0 10*3/uL (ref 0.0–0.1)
Basophils Relative: 0 %
Eosinophils Absolute: 0 10*3/uL (ref 0.0–0.5)
Eosinophils Relative: 0 %
Immature Granulocytes: 0 %
Lymphocytes Relative: 9 %
Lymphs Abs: 1.2 10*3/uL (ref 0.7–4.0)
Monocytes Absolute: 0.7 10*3/uL (ref 0.1–1.0)
Monocytes Relative: 5 %
Neutro Abs: 11.4 10*3/uL — ABNORMAL HIGH (ref 1.7–7.7)
Neutrophils Relative %: 86 %

## 2020-04-10 LAB — URINALYSIS, ROUTINE W REFLEX MICROSCOPIC
Bacteria, UA: NONE SEEN
Bilirubin Urine: NEGATIVE
Glucose, UA: >=500 mg/dL — AB
Hgb urine dipstick: NEGATIVE
Ketones, ur: 80 mg/dL — AB
Leukocytes,Ua: NEGATIVE
Nitrite: NEGATIVE
Protein, ur: NEGATIVE mg/dL
Specific Gravity, Urine: 1.038 — ABNORMAL HIGH (ref 1.005–1.030)
pH: 6 (ref 5.0–8.0)

## 2020-04-10 LAB — RAPID URINE DRUG SCREEN, HOSP PERFORMED
Amphetamines: NOT DETECTED
Barbiturates: NOT DETECTED
Benzodiazepines: NOT DETECTED
Cocaine: NOT DETECTED
Opiates: POSITIVE — AB
Tetrahydrocannabinol: POSITIVE — AB

## 2020-04-10 LAB — I-STAT BETA HCG BLOOD, ED (MC, WL, AP ONLY): I-stat hCG, quantitative: <5 m[IU]/mL (ref <5)

## 2020-04-10 LAB — PROTIME-INR
INR: 1.1 (ref 0.8–1.2)
Prothrombin Time: 13.8 seconds (ref 11.4–15.2)

## 2020-04-10 LAB — RESP PANEL BY RT-PCR (FLU A&B, COVID) ARPGX2
Influenza A by PCR: NEGATIVE
Influenza B by PCR: NEGATIVE
SARS Coronavirus 2 by RT PCR: NEGATIVE

## 2020-04-10 LAB — APTT: aPTT: 29 seconds (ref 24–36)

## 2020-04-10 LAB — ETHANOL: Alcohol, Ethyl (B): <10 mg/dL (ref <10)

## 2020-04-10 MED ORDER — METOCLOPRAMIDE HCL 5 MG/ML IJ SOLN
10.0000 mg | Freq: Once | INTRAMUSCULAR | Status: AC
  Administered 2020-04-10: 10 mg via INTRAVENOUS
  Filled 2020-04-10: qty 2

## 2020-04-10 MED ORDER — MORPHINE SULFATE (PF) 4 MG/ML IV SOLN
4.0000 mg | Freq: Once | INTRAVENOUS | Status: AC
  Administered 2020-04-10: 4 mg via INTRAVENOUS
  Filled 2020-04-10: qty 1

## 2020-04-10 MED ORDER — INSULIN ASPART 100 UNIT/ML ~~LOC~~ SOLN: 8.0000 [IU] | Freq: Once | SUBCUTANEOUS | Status: DC

## 2020-04-10 MED ORDER — LACTATED RINGERS IV BOLUS
1000.0000 mL | Freq: Once | INTRAVENOUS | Status: AC
  Administered 2020-04-10: 1000 mL via INTRAVENOUS

## 2020-04-10 MED ORDER — DIPHENHYDRAMINE HCL 50 MG/ML IJ SOLN
12.5000 mg | Freq: Once | INTRAMUSCULAR | Status: AC
  Administered 2020-04-10: 12.5 mg via INTRAVENOUS
  Filled 2020-04-10: qty 1

## 2020-04-10 NOTE — Discharge Instructions (Signed)
You were seen in the emergency department for evaluation of garbled speech, right-sided weakness, unresponsive episode, pain on the right side of your body.  You had a CAT scan along with blood work EKG and a neurology consult.  We did not find any acute findings.  They also did an EEG that did not show any signs of active seizure.  It will be important that you follow-up with your primary care doctor and your neurology team.  Continue your regular medications.

## 2020-04-10 NOTE — ED Triage Notes (Signed)
Pt arrives via EMS from home. When EMS arrived pt found unresponsive face down. Pt has history of seizure. Pt states she has been vomiting since 2030 02/11/20.

## 2020-04-10 NOTE — ED Notes (Signed)
Pt ambulatory with steady gait to restroom and attempting to provide urine sample

## 2020-04-10 NOTE — Progress Notes (Signed)
STAT EEG completed; Dr Melynda Ripple notified. Results pending.

## 2020-04-10 NOTE — Procedures (Signed)
Patient Name: Nancy [name]  MRN: [id number]  Epilepsy Attending: Charlsie Quest  Referring Physician/Provider: Lanae Boast, NP Date: 04/10/2020 Duration: 23.11 minutes  Patient history: 20 year old female with whole-body shaking seizure-like episodes.  EEG evaluate for seizures.  Level of alertness: Awake  AEDs during EEG study: Topamax, lamotrigine  Technical aspects: This EEG study was done with scalp electrodes positioned according to the 10-20 International system of electrode placement. Electrical activity was acquired at a sampling rate of 500Hz  and reviewed with a high frequency filter of 70Hz  and a low frequency filter of 1Hz . EEG data were recorded continuously and digitally stored.   Description: The posterior dominant rhythm consists of 8-9 Hz activity of moderate voltage (25-35 uV) seen predominantly in posterior head regions, symmetric and reactive to eye opening and eye closing.  Sleep was characterized by sleep symptoms (12 to 14 Hz) maximal frontocentral region.  Physiologic photic driving was seen during photic stimulation.  Hyperventilation was not performed.  IMPRESSION: This study is within normal limits. No seizures or epileptiform discharges were seen throughout the recording.  Nancy [name] 

## 2020-04-10 NOTE — Consult Note (Signed)
Neurology Consultation  Reason for Consult: Aphasia Referring Physician: Dr. Charm Barges  CC: Right-sided hemibody pain  History is obtained from: Chart review, EDP, Patient  HPI: Nancy [name] is a 20 y.o. 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 History of present illness is not fully clear but Nancy [name] communicated with the EDP via typing on her phone that she had been vomiting blood last night with chest pain and abdominal pain. It was communicated that patient activated EMS this morning by herself but she was found down unresponsive by EMS with garbled speech. On arrival, she was found to have right hemibody weakness versus functional weakness with aphasia (patient would not speak to EDP but would text to communicate). A code stroke was activated and patient was taken to CT without obvious acute intracranial abnormality. With patient's inconsistent physical examination, similar presentations in the past per chart review, and normal initial imaging, the code stroke was cancelled.   On neurology arrival, she was able to state her name and nodded "yes" when asked if she had a seizure. She notes that she has about 30 seizures per month and does follow up with a neurologist outpatient. She endorses taking her medication as prescribed and denies any increases in stress in her home / daily life. Of note patient has had inpatient EEG evaluations on 08/08/2019, 01/29/2019, 01/28/2019, 12/01/2018, 08/07/2018, and 06/15/2018 without evidence of seizures or epileptiform activity identified.    Record review with multiple prior EEGs that have been negative for  seizures.  Also has normal EEGs with full body stiffening episodes concerning for non-epileptic spells.  LKW: Unclear, initially reports feeling at baseline last night at 21:00, when reassessed, states she did not feel normal last night.  tpa given?: no, low suspicion for stroke with current presentation  ROS: Unable to obtain due to altered mental status.   Past Medical History:  Diagnosis Date  . Acanthosis nigricans   . Anxiety   . CHF (congestive heart failure) (HCC)   . Chronic lower back pain   . Depression   . DKA, type 1 (HCC) 09/13/2018  . Dyspepsia   . Obesity   . Ovarian cyst    pt is not aware of this hx (11/24/2017)  . 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 Premature baby   . Seizures (HCC)   . Type II diabetes mellitus (HCC)    insulin dependant   Past Surgical History:  Procedure Laterality Date  . ABDOMINAL HERNIA REPAIR     "I was a baby"  . BIOPSY  10/12/2018   Procedure: BIOPSY;  Surgeon: Lynann Bologna, MD;  Location: [facility] ENDOSCOPY;  Service: Endoscopy;;  . BIOPSY  02/28/2020   Procedure: BIOPSY;  Surgeon: Shellia Cleverly, DO;  Location: [facility] ENDOSCOPY;  Service: Gastroenterology;;  . ESOPHAGOGASTRODUODENOSCOPY (EGD) WITH PROPOFOL N/A 10/12/2018   Procedure: ESOPHAGOGASTRODUODENOSCOPY (EGD) WITH PROPOFOL;  Surgeon: Lynann Bologna, MD;  Location: [facility] ENDOSCOPY;  Service: Endoscopy;  Laterality: N/A;  . ESOPHAGOGASTRODUODENOSCOPY (EGD) WITH PROPOFOL N/A 02/28/2020   Procedure: ESOPHAGOGASTRODUODENOSCOPY (EGD) WITH PROPOFOL;  Surgeon: Shellia Cleverly, DO;  Location: [facility] ENDOSCOPY;  Service: Gastroenterology;  Laterality: N/A;  . FLEXIBLE SIGMOIDOSCOPY N/A 02/28/2020   Procedure: FLEXIBLE SIGMOIDOSCOPY;  Surgeon: Shellia Cleverly, DO;  Location: [facility] ENDOSCOPY;  Service: Gastroenterology;  Laterality: N/A;  . HERNIA REPAIR    . LEFT HEART CATH AND CORONARY ANGIOGRAPHY N/A 10/13/2018   Procedure: LEFT HEART CATH AND CORONARY ANGIOGRAPHY;  Surgeon: Kathleene Hazel, MD;   Location: [facility] INVASIVE CV LAB;  Service: Cardiovascular;  Laterality: N/A;  . TONSILLECTOMY AND ADENOIDECTOMY    . WISDOM TOOTH EXTRACTION  2017   Family History  Problem Relation Age of Onset  . Diabetes Mother   . Hypertension Mother   . Obesity Mother   . Asthma Mother   . Allergic rhinitis Mother   . Eczema Mother   . Cervical cancer Mother   . Diabetes Father   . Hypertension Father   . Obesity Father   . Hyperlipidemia Father   . Hypertension Paternal Aunt   . Hypertension Maternal Grandfather   . Colon cancer Maternal Grandfather   . Diabetes Paternal Grandmother   . Obesity Paternal Grandmother   . Diabetes Paternal Grandfather   . Obesity Paternal Grandfather   . Angioedema Neg Hx   . Immunodeficiency Neg Hx   . Urticaria Neg Hx   . Stomach cancer Neg Hx   . Esophageal cancer Neg Hx    Social History:   reports that she has never smoked. She has never used smokeless tobacco. She reports that she does not drink alcohol and does not use drugs.  Medications No current facility-administered medications for this encounter.  Current Outpatient Medications:  .  ARIPiprazole (ABILIFY) 10 MG tablet, Take 10 mg by mouth in the morning and at bedtime. , Disp: , Rfl:  .  atorvastatin (LIPITOR) 20 MG tablet, Take 1 tablet (20 mg total) by mouth daily at 6 PM. (Patient taking differently: Take 20 mg by mouth daily.), Disp: 30 tablet, Rfl: 1 .  busPIRone (BUSPAR) 10 MG tablet, Take 10 mg by mouth 2 (two) times daily. , Disp: , Rfl:  .  capsaicin (ZOSTRIX) 0.025 % cream, Apply topically 2 (two) times daily., Disp: 60 g, Rfl: 0 .  capsaicin (ZOSTRIX) 0.025 % cream, APPLY TOPICALLY TWO TIMES DAILY., Disp: 60 g, Rfl: 0 .  Cyanocobalamin (VITAMIN B-12 PO), Take 1 tablet by mouth daily., Disp: , Rfl:  .  diclofenac Sodium (VOLTAREN) 1 % GEL, Apply 1 application topically 2 (two) times daily as needed (chest pains)., Disp: , Rfl:  .  insulin aspart (NOVOLOG FLEXPEN) 100 UNIT/ML FlexPen,  Inject 15 Units into the skin 2 (two) times daily before a meal., Disp: , Rfl:  .  insulin glargine (LANTUS) 100 UNIT/ML Solostar Pen, INJECT 30 UNITS INTO THE SKIN AT BEDTIME, Disp: 15 mL, Rfl: 0 .  insulin glargine (LANTUS) 100 unit/mL SOPN, Inject 30 Units into the skin at bedtime., Disp: 15 mL, Rfl: 0 .  lamoTRIgine (LAMICTAL) 25 MG tablet, Take 2 tablets (50 mg total) by mouth 2 (two) times daily. For mood stabilization, Disp: 60 tablet, Rfl: 0 .  lidocaine (LIDODERM) 5 %, Place 1 patch onto the skin daily as needed (pain). Remove & Discard patch within 12 hours or as directed by MD, Disp: , Rfl:  .  lubiprostone (AMITIZA) 24 MCG capsule, Take 1 capsule (24 mcg total) by mouth 2 (two) times daily with a meal. (Patient taking differently: Take 24 mcg by mouth daily with breakfast.), Disp: 60 capsule, Rfl: 6 .  metoCLOPramide (REGLAN) 5 MG tablet, Take 1 tablet (5 mg total) by mouth 3 (three) times daily., Disp: 90  tablet, Rfl: 0 .  mirtazapine (REMERON) 7.5 MG tablet, Take 1 tablet (7.5 mg total) by mouth at bedtime. For depression/sleep, Disp: 30 tablet, Rfl: 0 .  Multiple Vitamin (MULTIVITAMIN WITH MINERALS) TABS tablet, Take 1 tablet by mouth daily., Disp: 30 tablet, Rfl: 1 .  pantoprazole (PROTONIX) 40 MG tablet, Take 1 tablet (40 mg total) by mouth 2 (two) times daily. For acid reflux, Disp: 30 tablet, Rfl: 1 .  polyethylene glycol (MIRALAX / GLYCOLAX) 17 g packet, Take 17 g by mouth 2 (two) times daily., Disp: 14 each, Rfl: 0 .  promethazine (PHENERGAN) 25 MG suppository, PLACE 1 SUPPOSITORY (25 MG TOTAL) RECTALLY EVERY 6 (SIX) HOURS AS NEEDED FOR NAUSEA OR VOMITING., Disp: 12 suppository, Rfl: 0 .  propranolol (INDERAL) 20 MG tablet, Take 1 tablet (20 mg total) by mouth 2 (two) times daily. For anxiety/HTN, Disp: 60 tablet, Rfl: 0 .  QUEtiapine (SEROQUEL) 100 MG tablet, Take 300 mg by mouth at bedtime., Disp: , Rfl:  .  QUEtiapine (SEROQUEL) 25 MG tablet, Take 25 mg by mouth every morning.,  Disp: , Rfl:  .  senna-docusate (SENOKOT-S) 8.6-50 MG tablet, Take 2 tablets by mouth at bedtime., Disp: 60 tablet, Rfl: 0 .  senna-docusate (SENOKOT-S) 8.6-50 MG tablet, TAKE 2 TABLETS BY MOUTH AT BEDTIME., Disp: 60 tablet, Rfl: 0 .  sucralfate (CARAFATE) 1 GM/10ML suspension, Take 1 g by mouth at bedtime as needed (stomach pain)., Disp: , Rfl:  .  topiramate (TOPAMAX) 25 MG tablet, Take 1 tablet (25 mg total) by mouth 2 (two) times daily. For seizure activities, Disp: 60 tablet, Rfl: 0  Exam: Current vital signs: BP (!) 129/103 (BP Location: Right Arm)   Pulse (!) 115   Temp 98.3 F (36.8 C)   Resp 17   Ht 5\' 3"  (1.6 m)   Wt 84.1 kg   SpO2 100%   BMI 32.84 kg/m  Vital signs in last 24 hours: Temp:  [98.3 F (36.8 C)] 98.3 F (36.8 C) (04/05 1050) Pulse Rate:  [115] 115 (04/05 1050) Resp:  [17] 17 (04/05 1050) BP: (129)/(103) 129/103 (04/05 1050) SpO2:  [100 %] 100 % (04/05 1050) Weight:  [84.1 kg] 84.1 kg (04/05 1100)  GENERAL: Awake, alert, laying in ER stretcher, in no acute distress Head: Normocephalic and atraumatic without obvious abnormality EENT: No OP obstruction, normal conjunctivae LUNGS: Normal respiratory effort. Non-labored breathing CV: Tachycardic on cardiac monitor ABDOMEN - Soft, non-distended Ext: warm, well perfused  NEURO:  Mental Status: Awake, alert to self. States that she is 20 years old and that the month is July. Does not answer orientation questions about location. She is unable to give a clear or coherent history.  Patient has an inconsistent speech pattern: initially mute for EDP but able to text to communicate via her cell phone. Initially with neurology she had stuttering speech but with varying speech patterns.  She is not aphasic or dysarthric. No neglect noted.  Cranial Nerves:  II: PERRL, visual fields full, fixates and tracks examiner around room III, IV, VI: EOMI without ptosis V, VII: Face is symmetric resting and smiling.   VIII:  Hearing intact to voice IX, X: Phonation normal XI: Normal sternocleidomastoid and trapezius muscle strength XII: Tongue protrudes midline without fasciculations.   Motor: 5/5 strength is all muscle groups with limited strength assessment due to pain on the right hemibody with questionable functional component contributing. She can lift all extremities against gravity without vertical drift but with lesser degree of arm  and leg raise on the right. When asked she states "pain" and when asked to point to her pain she motions from right face downward to right leg. Tone is normal. Bulk is normal.  Sensation: Reports decreased sensation on the right inconsistently.  Gait- deferred  Labs I have reviewed labs in epic and the results pertinent to this consultation are: CBC    Component Value Date/Time   WBC 13.4 (H) 04/10/2020 1059   RBC 5.33 (H) 04/10/2020 1059   HGB 14.6 04/10/2020 1105   HGB 12.8 10/05/2018 1133   HCT 43.0 04/10/2020 1105   HCT 40.8 10/05/2018 1133   PLT 333 04/10/2020 1059   PLT 329 10/05/2018 1133   MCV 78.2 (L) 04/10/2020 1059   MCV 89 10/05/2018 1133   MCH 25.0 (L) 04/10/2020 1059   MCHC 31.9 04/10/2020 1059   RDW 15.7 (H) 04/10/2020 1059   RDW 13.1 10/05/2018 1133   LYMPHSABS 1.2 04/10/2020 1059   LYMPHSABS 2.5 10/05/2018 1133   MONOABS 0.7 04/10/2020 1059   EOSABS 0.0 04/10/2020 1059   EOSABS 0.1 10/05/2018 1133   BASOSABS 0.0 04/10/2020 1059   BASOSABS 0.1 10/05/2018 1133  CMP     Component Value Date/Time   NA 131 (L) 04/10/2020 1105   NA 139 10/05/2018 1133   K 6.5 (HH) 04/10/2020 1105   CL 96 (L) 04/10/2020 1105   CO2 25 02/29/2020 0520   GLUCOSE 396 (H) 04/10/2020 1105   BUN 6 04/10/2020 1105   BUN 13 10/05/2018 1133   CREATININE 0.40 (L) 04/10/2020 1105   CREATININE 0.54 09/18/2012 1150   CALCIUM 9.0 02/29/2020 0520   PROT 6.0 (L) 02/29/2020 0520   PROT 7.2 10/05/2018 1133   ALBUMIN 3.2 (L) 02/29/2020 0520   ALBUMIN 4.7 10/05/2018 1133    AST 26 02/29/2020 0520   ALT 25 02/29/2020 0520   ALKPHOS 63 02/29/2020 0520   BILITOT 0.3 02/29/2020 0520   BILITOT <0.2 10/05/2018 1133   GFRNONAA >60 02/29/2020 0520   GFRAA >60 10/03/2019 0523  Lipid Panel     Component Value Date/Time   CHOL 95 11/07/2018 1804   CHOL 190 10/05/2018 1133   TRIG 86 11/07/2018 1804   HDL 52 11/07/2018 1804   HDL 84 10/05/2018 1133   CHOLHDL 1.8 11/07/2018 1804   VLDL 17 11/07/2018 1804   LDLCALC 26 11/07/2018 1804   LDLCALC 72 10/05/2018 1133   Imaging I have reviewed the images obtained: CT-scan of the brain: "IMPRESSION: There is no acute intracranial hemorrhage or evidence of acute infarction. ASPECT score is 10."  Routine EEG 04/10/20: "This study is within normal limits. No seizures or epileptiform discharges were seen throughout the recording."  Assessment: 20 year old female with history of seizures versus pseudoseizures who presented from home after reported vomiting blood, abdominal pain, and chest pain last night a self-reported seizure this morning. - Examination reveals patient with inconsistent speech patterns, inconsistent history of present illness, and right sided weakness (pain limited and likely functional weakness). - Previous EEGs without supporting evidence for seizures or epileptiform discharges in the past.  - Reports compliance with home medications and outpatient neurology follow-up.  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 Routine EEG pending for further evaluation.  - Initial work up reveals patient is tachycardic with leukocytosis (WBC 13.4) but afebrile and with a blood glucose of 396 (poorly controlled type 2 diabetes mellitus).  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 Currently on Topamax 25 mg BID and lamotrigine 50 mg BID (for mood stabilization). Would not adjust or increase medications unless abnormalities identified on routine EEG monitoring.  - Infectious work up / hyperglycemia management per primary team: UA, CXR - Inpatient seizure precautions - Even though patient most likely has nonepileptic events, patient needs to continue to follow seizure precautions including do not drive  -- Pt seen by NP/Neuro and later by MD. Note/plan to be edited by MD as needed.  Lanae Boast, AGAC-NP [facility] Pager: [phone]  Attending addendum Patient seen and examined is an acute code stroke in the emergency room I have reviewed the imaging independently.  CT head with no acute changes. Chart review was also independently performed-possible history of seizures versus nonepileptic spells amongst other comorbidities. Examination was inconsistent as documented above and confirmed by me. Spot EEG-repeated again-normal.  Somewhat hyponatremic and hyperkalemic-management per ER. No further inpatient neurologic work-up at this time. Also complains of some headache-can be treated with a migraine cocktail in the ED. Follow-up with outpatient neurology Might need outpatient psychiatry referral as well Discussed with Dr. Charm Barges in the ER.  -- Milon Dikes, MD Neurologist [facility] Pager: [phone]  CRITICAL CARE ATTESTATION Performed by: Milon Dikes, MD Total critical care time: 55 minutes Critical care time was exclusive of separately billable procedures and treating other patients and/or supervising APPs/Residents/Students Critical care was necessary to treat or prevent imminent or life-threatening deterioration due to strokelike symptoms, concern for seizures and status epilepticus. This patient is critically ill and at significant risk for neurological worsening and/or death and care requires constant  monitoring. Critical care was time spent personally by me on the following activities: development of treatment plan with patient and/or surrogate as well as nursing, discussions with consultants, evaluation of patient's response to treatment, examination of patient, obtaining history from patient or surrogate, ordering and performing treatments and interventions, ordering and review of laboratory studies, ordering and review of radiographic studies, pulse oximetry, re-evaluation of patient's condition, participation in multidisciplinary rounds and medical decision making of high complexity in the care of this patient.

## 2020-04-10 NOTE — ED Notes (Signed)
Hooked patient back up to the monitor patient is resting with call bell in reach 

## 2020-04-10 NOTE — ED Provider Notes (Signed)
[facility] EMERGENCY DEPARTMENT Provider Note   CSN: [id number] Arrival date & time: 04/10/20  1046     History Chief Complaint  Patient presents with  . Altered Mental Status    Nancy [name] is a 20 y.o. female.  History limited due to patient's inability to speak.  She is complaining of right-sided headache and garbled speech.  Possibly right arm and leg weakness.  She said she was vomiting last night and had chest and abdominal pain.  Possibly vomiting blood.  Was found face down at her home.  I believe she was the one who notified EMS.  She says her last known well was 8 AM this morning.  She is giving me this history via texting on her phone.  The history is provided by the patient.  Cerebrovascular Accident This is a new problem. The current episode started 3 to 5 hours ago. The problem occurs constantly. The problem has been gradually improving. Associated symptoms include chest pain, abdominal pain and headaches. Pertinent negatives include no shortness of breath. Nothing aggravates the symptoms. Nothing relieves the symptoms. She has tried nothing for the symptoms. The treatment provided no relief.       Past Medical History:  Diagnosis Date  . Acanthosis nigricans   . Anxiety   . CHF (congestive heart failure) (HCC)   . Chronic lower back pain   . Depression   . DKA, type 1 (HCC) 09/13/2018  . Dyspepsia   . Obesity   . Ovarian cyst    pt is not aware of this hx (11/24/2017)  . 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 Premature baby   . Seizures (HCC)   . Type II diabetes mellitus (HCC)    insulin dependant    Patient Active Problem List   Diagnosis Date Noted  . Abdominal pain 02/22/2020  . Chronic constipation 02/22/2020  . Nausea & vomiting 02/22/2020  . SIRS (systemic inflammatory response syndrome) (HCC) 01/14/2020  . Diabetic gastroparesis (HCC) 11/25/2019  . Polysubstance abuse (HCC) 11/25/2019  . Gastritis 11/25/2019  . DKA, type 1 (HCC)  11/24/2019  . Cyclical vomiting with nausea 10/01/2019  . Intractable nausea and vomiting 09/30/2019  . Cellulitis   . DKA (diabetic ketoacidoses) 01/27/2019  . Leukocytosis 12/19/2018  . Dehydration   . Pseudoseizures (HCC)   . Type 1 diabetes mellitus with ketoacidosis without coma (HCC) 11/28/2018  . Hyperglycemia 11/27/2018  . 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  . MDD (major depressive disorder), recurrent episode, severe (HCC) 11/06/2018  . Nonspecific abnormal electrocardiogram (ECG) (EKG)   . Chest pain of uncertain etiology   . Hypertensive urgency 10/08/2018  . Conversion disorder with attacks or seizures, acute episode, with psychological stressor 09/16/2018  . MDD (major depressive disorder), recurrent severe, without psychosis (HCC)   . AKI (acute kidney injury) (HCC) 08/04/2018  . Seizure-like activity (HCC) 08/03/2018  . Depression 07/25/2018  . Syncope 01/30/2018  . Orthostatic hypotension 01/24/2018  . Tachycardia 12/28/2017  . Chronic abdominal pain 12/24/2017  . Chest pain 12/19/2017  . Nausea and vomiting 08/21/2017  . Generalized abdominal pain 08/21/2017  . Non compliance with medical treatment 01/27/2012  . Adjustment disorder 09/16/2011  . Acanthosis nigricans   . Goiter   . Obesity 06/14/2010  . Hypertension 06/14/2010    Past Surgical History:  Procedure Laterality Date  . ABDOMINAL HERNIA REPAIR     "I was a baby"  . BIOPSY  10/12/2018   Procedure: BIOPSY;  Surgeon: Lynann Bologna, MD;  Location: [facility] ENDOSCOPY;  Service: Endoscopy;;  . BIOPSY  02/28/2020   Procedure: BIOPSY;  Surgeon: Shellia Cleverly, DO;  Location: [facility] ENDOSCOPY;  Service: Gastroenterology;;  . ESOPHAGOGASTRODUODENOSCOPY (EGD) WITH PROPOFOL N/A 10/12/2018   Procedure: ESOPHAGOGASTRODUODENOSCOPY (EGD) WITH PROPOFOL;  Surgeon: Lynann Bologna, MD;  Location: [facility] ENDOSCOPY;  Service: Endoscopy;  Laterality: N/A;  . ESOPHAGOGASTRODUODENOSCOPY  (EGD) WITH PROPOFOL N/A 02/28/2020   Procedure: ESOPHAGOGASTRODUODENOSCOPY (EGD) WITH PROPOFOL;  Surgeon: Shellia Cleverly, DO;  Location: [facility] ENDOSCOPY;  Service: Gastroenterology;  Laterality: N/A;  . FLEXIBLE SIGMOIDOSCOPY N/A 02/28/2020   Procedure: FLEXIBLE SIGMOIDOSCOPY;  Surgeon: Shellia Cleverly, DO;  Location: [facility] ENDOSCOPY;  Service: Gastroenterology;  Laterality: N/A;  . HERNIA REPAIR    . LEFT HEART CATH AND CORONARY ANGIOGRAPHY N/A 10/13/2018   Procedure: LEFT HEART CATH AND CORONARY ANGIOGRAPHY;  Surgeon: Kathleene Hazel, MD;  Location: [facility] INVASIVE CV LAB;  Service: Cardiovascular;  Laterality: N/A;  . TONSILLECTOMY AND ADENOIDECTOMY    . WISDOM TOOTH EXTRACTION  2017     OB History   No obstetric history on file.     Family History  Problem Relation Age of Onset  . Diabetes Mother   . Hypertension Mother   . Obesity Mother   . Asthma Mother   . Allergic rhinitis Mother   . Eczema Mother   . Cervical cancer Mother   . Diabetes Father   . Hypertension Father   . Obesity Father   . Hyperlipidemia Father   . Hypertension Paternal Aunt   . Hypertension Maternal Grandfather   . Colon cancer Maternal Grandfather   . Diabetes Paternal Grandmother   . Obesity Paternal Grandmother   . Diabetes Paternal Grandfather   . Obesity Paternal Grandfather   . Angioedema Neg Hx   . Immunodeficiency Neg Hx   . Urticaria Neg Hx   . Stomach cancer Neg Hx   . Esophageal cancer Neg Hx     Social History   Tobacco Use  . Smoking status: Never Smoker  . Smokeless tobacco: Never Used  Vaping Use  . Vaping Use: Never used  Substance Use Topics  . Alcohol use: No    Alcohol/week: 0.0 standard drinks  . Drug use: No    Home Medications Prior to Admission medications   Medication Sig Start Date End Date Taking? Authorizing Provider  ARIPiprazole (ABILIFY) 10 MG tablet Take 10 mg by mouth in the morning and at bedtime.     [provider]  atorvastatin  (LIPITOR) 20 MG tablet Take 1 tablet (20 mg total) by mouth daily at 6 PM. Patient taking differently: Take 20 mg by mouth daily. 10/05/18   Storm Frisk, MD  busPIRone (BUSPAR) 10 MG tablet Take 10 mg by mouth 2 (two) times daily.  11/19/18   [provider]  capsaicin (ZOSTRIX) 0.025 % cream Apply topically 2 (two) times daily. 02/29/20   Ghimire, Werner Lean, MD  capsaicin (ZOSTRIX) 0.025 % cream APPLY TOPICALLY TWO TIMES DAILY. 02/29/20 02/28/21  Ghimire, Werner Lean, MD  Cyanocobalamin (VITAMIN B-12 PO) Take 1 tablet by mouth daily.    [provider]  diclofenac Sodium (VOLTAREN) 1 % GEL Apply 1 application topically 2 (two) times daily as needed (chest pains).    [provider]  insulin aspart (NOVOLOG FLEXPEN) 100 UNIT/ML FlexPen Inject 15 Units into the skin 2 (two) times daily before a meal.    [provider]  insulin glargine (LANTUS) 100 UNIT/ML  Solostar Pen INJECT 30 UNITS INTO THE SKIN AT BEDTIME 02/29/20 02/28/21  Ghimire, Werner Lean, MD  insulin glargine (LANTUS) 100 unit/mL SOPN Inject 30 Units into the skin at bedtime. 02/29/20   Ghimire, Werner Lean, MD  lamoTRIgine (LAMICTAL) 25 MG tablet Take 2 tablets (50 mg total) by mouth 2 (two) times daily. For mood stabilization 11/11/18   Nwoko, Nicole Kindred I, NP  lidocaine (LIDODERM) 5 % Place 1 patch onto the skin daily as needed (pain). Remove & Discard patch within 12 hours or as directed by MD    [provider]  lubiprostone (AMITIZA) 24 MCG capsule Take 1 capsule (24 mcg total) by mouth 2 (two) times daily with a meal. Patient taking differently: Take 24 mcg by mouth daily with breakfast. 10/27/18   Esterwood, Amy S, PA-C  metoCLOPramide (REGLAN) 5 MG tablet Take 1 tablet (5 mg total) by mouth 3 (three) times daily. 10/03/19   Jerald Kief, MD  mirtazapine (REMERON) 7.5 MG tablet Take 1 tablet (7.5 mg total) by mouth at bedtime. For depression/sleep 11/11/18   Armandina Stammer I, NP  Multiple Vitamin  (MULTIVITAMIN WITH MINERALS) TABS tablet Take 1 tablet by mouth daily. 09/18/18   Clapacs, Jackquline Denmark, MD  pantoprazole (PROTONIX) 40 MG tablet Take 1 tablet (40 mg total) by mouth 2 (two) times daily. For acid reflux 01/29/19   Jae Dire, MD  polyethylene glycol (MIRALAX / GLYCOLAX) 17 g packet Take 17 g by mouth 2 (two) times daily. 01/16/20   Swayze, Ava, DO  promethazine (PHENERGAN) 25 MG suppository PLACE 1 SUPPOSITORY (25 MG TOTAL) RECTALLY EVERY 6 (SIX) HOURS AS NEEDED FOR NAUSEA OR VOMITING. 02/29/20 02/28/21  Ghimire, Werner Lean, MD  propranolol (INDERAL) 20 MG tablet Take 1 tablet (20 mg total) by mouth 2 (two) times daily. For anxiety/HTN 12/23/18   Swayze, Ava, DO  QUEtiapine (SEROQUEL) 100 MG tablet Take 300 mg by mouth at bedtime. 07/05/19   [provider]  QUEtiapine (SEROQUEL) 25 MG tablet Take 25 mg by mouth every morning.    [provider]  senna-docusate (SENOKOT-S) 8.6-50 MG tablet Take 2 tablets by mouth at bedtime. 02/29/20   Ghimire, Werner Lean, MD  senna-docusate (SENOKOT-S) 8.6-50 MG tablet TAKE 2 TABLETS BY MOUTH AT BEDTIME. 02/29/20 02/28/21  Ghimire, Werner Lean, MD  sucralfate (CARAFATE) 1 GM/10ML suspension Take 1 g by mouth at bedtime as needed (stomach pain). 07/05/19   [provider]  topiramate (TOPAMAX) 25 MG tablet Take 1 tablet (25 mg total) by mouth 2 (two) times daily. For seizure activities 11/11/18   Armandina Stammer I, NP  prochlorperazine (COMPAZINE) 25 MG suppository PLACE 1 SUPPOSITORY (25 MG TOTAL) RECTALLY EVERY TWELVE HOURS AS NEEDED FOR NAUSEA OR VOMITING. 02/29/20 02/29/20  Ghimire, Werner Lean, MD    Allergies    Tomato, Ibuprofen, and Oatmeal  Review of Systems   Review of Systems  Constitutional: Negative for fever.  HENT: Negative for sore throat.   Eyes: Negative for visual disturbance.  Respiratory: Negative for shortness of breath.   Cardiovascular: Positive for chest pain.  Gastrointestinal: Positive for abdominal pain.   Genitourinary: Negative for dysuria.  Musculoskeletal: Negative for neck pain.  Skin: Negative for rash.  Neurological: Positive for speech difficulty, weakness and headaches.    Physical Exam Updated Vital Signs BP (!) 129/103 (BP Location: Right Arm)   Pulse (!) 115   Temp 98.3 F (36.8 C)   Resp 17   SpO2 100%   Physical Exam  Vitals and nursing note reviewed.  Constitutional:      General: She is not in acute distress.    Appearance: Normal appearance. She is well-developed.  HENT:     Head: Normocephalic and atraumatic.  Eyes:     Conjunctiva/sclera: Conjunctivae normal.  Cardiovascular:     Rate and Rhythm: Normal rate and regular rhythm.     Heart sounds: No murmur heard.   Pulmonary:     Effort: Pulmonary effort is normal. No respiratory distress.     Breath sounds: Normal breath sounds.  Abdominal:     Palpations: Abdomen is soft.     Tenderness: There is no abdominal tenderness.  Musculoskeletal:        General: No deformity or signs of injury. Normal range of motion.     Cervical back: Neck supple.  Skin:    General: Skin is warm and dry.     Capillary Refill: Capillary refill takes less than 2 seconds.  Neurological:     Mental Status: She is alert.     Cranial Nerves: Cranial nerve deficit present.     Motor: Weakness present.     Comments: Patient is awake and alert.  She has garbled speech.  No gross facial asymmetry.  She has weakness in her right arm and right leg although is inconsistent in her exam her speech is also inconsistent.  She is texting on her phone to communicate without any difficulty.     ED Results / Procedures / Treatments   Labs (all labs ordered are listed, but only abnormal results are displayed) Labs Reviewed  CBC - Abnormal; Notable for the following components:      Result Value   WBC 13.4 (*)    RBC 5.33 (*)    MCV 78.2 (*)    MCH 25.0 (*)    RDW 15.7 (*)    All other components within normal limits  DIFFERENTIAL -  Abnormal; Notable for the following components:   Neutro Abs 11.4 (*)    All other components within normal limits  COMPREHENSIVE METABOLIC PANEL - Abnormal; Notable for the following components:   Sodium 132 (*)    Potassium 3.4 (*)    Chloride 95 (*)    Glucose, Bld 391 (*)    BUN <5 (*)    All other components within normal limits  RAPID URINE DRUG SCREEN, HOSP PERFORMED - Abnormal; Notable for the following components:   Opiates POSITIVE (*)    Tetrahydrocannabinol POSITIVE (*)    All other components within normal limits  URINALYSIS, ROUTINE W REFLEX MICROSCOPIC - Abnormal; Notable for the following components:   APPearance HAZY (*)    Specific Gravity, Urine 1.038 (*)    Glucose, UA >=500 (*)    Ketones, ur 80 (*)    All other components within normal limits  BASIC METABOLIC PANEL - Abnormal; Notable for the following components:   Sodium 134 (*)    Glucose, Bld 275 (*)    BUN <5 (*)    Creatinine, Ser 0.43 (*)    All other components within normal limits  I-STAT CHEM 8, ED - Abnormal; Notable for the following components:   Sodium 131 (*)    Potassium 6.5 (*)    Chloride 96 (*)    Creatinine, Ser 0.40 (*)    Glucose, Bld 396 (*)    Calcium, Ion 0.92 (*)    All other components within normal limits  CBG MONITORING, ED - Abnormal; Notable for the following  components:   Glucose-Capillary 357 (*)    All other components within normal limits  CBG MONITORING, ED - Abnormal; Notable for the following components:   Glucose-Capillary 247 (*)    All other components within normal limits  RESP PANEL BY RT-PCR (FLU A&B, COVID) ARPGX2  ETHANOL  PROTIME-INR  APTT  I-STAT BETA HCG BLOOD, ED ([facility], [facility], [facility] ONLY)    EKG EKG Interpretation  Date/Time:  Tuesday April 10 2020 10:51:34 EDT Ventricular Rate:  122 PR Interval:  130 QRS Duration: 88 QT Interval:  328 QTC Calculation: 468 R Axis:   68 Text Interpretation: Sinus tachycardia Borderline repolarization abnormality ST  elevation, consider anterior injury No significant change since prior 2/22 Confirmed by Meridee ScoreButler, Kameron Glazebrook [phone]([id number]) on 04/10/2020 10:59:08 AM   Radiology EEG adult  Result Date: 04/10/2020 Charlsie QuestYadav, Priyanka O, MD     04/10/2020  1:28 PM Patient Name: Denice ParadiseRashonda N [place] MRN: [id number] Epilepsy Attending: Charlsie QuestPriyanka O Yadav Referring Physician/Provider: Lanae BoastStevi Toberman, NP Date: 04/10/2020 Duration: 23.11 minutes  Patient history: 20 year old female with whole-body shaking seizure-like episodes.  EEG evaluate for seizures.  Level of alertness: Awake  AEDs during EEG study: Topamax, lamotrigine  Technical aspects: This EEG study was done with scalp electrodes positioned according to the 10-20 International system of electrode placement. Electrical activity was acquired at a sampling rate of 500Hz  and reviewed with a high frequency filter of 70Hz  and a low frequency filter of 1Hz . EEG data were recorded continuously and digitally stored.  Description: The posterior dominant rhythm consists of 8-9 Hz activity of moderate voltage (25-35 uV) seen predominantly in posterior head regions, symmetric and reactive to eye opening and eye closing.  Sleep was characterized by sleep symptoms (12 to 14 Hz) maximal frontocentral region.  Physiologic photic driving was seen during photic stimulation.  Hyperventilation was not performed. IMPRESSION: This study is within normal limits. No seizures or epileptiform discharges were seen throughout the recording.  Charlsie QuestPriyanka O Yadav   CT HEAD CODE STROKE WO CONTRAST  Result Date: 04/10/2020 CLINICAL DATA:  Code stroke.  Right-sided weakness EXAM: CT HEAD WITHOUT CONTRAST TECHNIQUE: Contiguous axial images were obtained from the base of the skull through the vertex without intravenous contrast. COMPARISON:  September 2021 FINDINGS: Brain: There is no acute intracranial hemorrhage, mass effect, or edema. Gray-white differentiation is preserved. Small foci of low attenuation in the frontoparietal  white matter appear to correspond to mildly prominent perivascular spaces on prior MRI. There is no hydrocephalus. No extra-axial collection. Vascular: No hyperdense vessel. Skull: Unremarkable. Sinuses/Orbits: No acute abnormality. Other: Mastoid air cells are clear. ASPECTS (Alberta Stroke Program Early CT Score) - Ganglionic level infarction (caudate, lentiform nuclei, internal capsule, insula, M1-M3 cortex): 7 - Supraganglionic infarction (M4-M6 cortex): 3 Total score (0-10 with 10 being normal): 10 IMPRESSION: There is no acute intracranial hemorrhage or evidence of acute infarction. ASPECT score is 10. These results were communicated to Dr. Wilford CornerArora at 11:15 am on 04/10/2020 by text page via the [facility] messaging system. Electronically Signed   By: Guadlupe SpanishPraneil  Patel M.D.   On: 04/10/2020 11:19    Procedures Procedures   Medications Ordered in ED Medications  lactated ringers bolus 1,000 mL (0 mLs Intravenous Stopped 04/10/20 1515)  morphine 4 MG/ML injection 4 mg (4 mg Intravenous Given 04/10/20 1152)  metoCLOPramide (REGLAN) injection 10 mg (10 mg Intravenous Given 04/10/20 1156)  diphenhydrAMINE (BENADRYL) injection 12.5 mg (12.5 mg Intravenous Given 04/10/20 1153)    ED Course  I have reviewed the triage  vital signs and the nursing notes.  Pertinent labs & imaging results that were available during my care of the patient were reviewed by me and considered in my medical decision making (see chart for details).  Clinical Course as of 04/10/20 1917  Tue Apr 10, 2020  1103 She is now getting a little bit better effort with her speech.  Still has some right-sided weakness.  She thinks she may have had a seizure. [MB]  1106 Reviewed prior ED visits.  She gets admitted sometimes for DKA and sometimes for intractable nausea vomiting.  There is mention of chronic chest and abdominal pain.  Poorly controlled seizures with multiple seizures today. [MB]  1113 Neurology Dr. Wilford Corner evaluated the patient.  He is also  found other neurology work-ups in the past and her seizures tend to be pseudoseizures.  He is canceling the code stroke. [MB]  1156 I-STAT showing potassium to be critically high at 6.5 but came back low normal on the CMP. [MB]  1221 Reevaluation, patient now with fluent speech and no weakness.  She had some pain medication and states that did not do anything she still has pain on the right side of her body. [MB]  1456 Patient is ambulated in the department without any difficulty.  She has a lot of complaints concerning her chest pain and abdominal pain but these sound much more chronic.  Recommended close follow-up with her PCP and her neurologist. [MB]    Clinical Course User Index [MB] Terrilee Files, MD   MDM Rules/Calculators/A&P                         This patient complains of vomiting of blood, chest pain abdominal pain, unresponsive episode, difficulty with speech and right-sided weakness; this involves an extensive number of treatment Options and is a complaint that carries with it a high risk of complications and Morbidity. The differential includes stroke, bleed, vascular, psychosomatic, dissection  I ordered, reviewed and interpreted labs, which included CBC with elevated white count, stable hemoglobin chemistries with elevated glucose, normal gap, chemistries with elevated ketones, urine tox positive for opiates and THC I ordered medication IV fluids, IV pain medication migraine cocktail I ordered imaging studies which included CT head, and I independently    visualized and interpreted imaging which showed no acute findings Additional history obtained from EMS Previous records obtained and reviewed in epic, multiple prior work-ups for atypical chest and abdominal pain intractable vomiting and neurologic symptoms I consulted neuro hospitalist Dr. Wilford Corner And discussed lab and imaging findings  Critical Interventions: None  After the interventions stated above, I reevaluated the  patient and found patient to be neurologically improved.  She continues to complain of her chronic chest and abdominal pain.  She has an unremarkable exam and stable vital signs.  Commend close follow-up with her treating physicians.   Final Clinical Impression(s) / ED Diagnoses Final diagnoses:  Unresponsive episode  Garbled speech  Pain of right side of body  Hyperglycemia    Rx / DC Orders ED Discharge Orders    None       Terrilee Files, MD 04/10/20 Ernestina Columbia

## 2020-04-10 NOTE — Code Documentation (Signed)
Pt from home. Unsure who called EMS but they found patient unresponsive. She eventually came around. She was taken to [facility] and triaged. A code stroke was activated after MD assessed pt with aphasia, right sided pain (in her head per pt). Stroke team met pt at bedside and she was taken to CT. CT completed. Pt was not very consistent with her assessment. She had an NIHSS of 6 with some right sided weakness (that would come and go), aphasia, and inability to verbalize her answers. She would use her right side with no difficulty to lift her phone and text long answers to our questions. MD does not believe this is a stroke. CT was negative. See results below.  LKW was questionable. Pt texted that she had been "vomiting up blood" and had pain in her head since 2030 last evening. Pt has a hx of seizures. Her CBG was 433 with EMS. MD will order an EEG. Code stroke was canceled. Hand off with Benjamine Mola, RN. Vinnie Bobst, Rande Brunt, RN   "IMPRESSION: There is no acute intracranial hemorrhage or evidence of acute infarction. ASPECT score is 10."   Zebediah Beezley, Rande Brunt, RN  Stroke Response Nurse

## 2020-04-11 LAB — I-STAT CHEM 8, ED
BUN: 6 mg/dL (ref 6–20)
Calcium, Ion: 0.92 mmol/L — ABNORMAL LOW (ref 1.15–1.40)
Chloride: 96 mmol/L — ABNORMAL LOW (ref 98–111)
Creatinine, Ser: 0.4 mg/dL — ABNORMAL LOW (ref 0.44–1.00)
Glucose, Bld: 396 mg/dL — ABNORMAL HIGH (ref 70–99)
HCT: 43 % (ref 36.0–46.0)
Hemoglobin: 14.6 g/dL (ref 12.0–15.0)
Potassium: 6.5 mmol/L (ref 3.5–5.1)
Sodium: 131 mmol/L — ABNORMAL LOW (ref 135–145)
TCO2: 29 mmol/L (ref 22–32)

## 2020-04-15 ENCOUNTER — Emergency Department (HOSPITAL_COMMUNITY)
Admission: EM | Admit: 2020-04-15 | Discharge: 2020-04-16 | Disposition: A | Discharge status: Home / Self Care | Payer: Medicaid Other | Attending: Emergency Medicine | Admitting: Emergency Medicine

## 2020-04-15 ENCOUNTER — Encounter (HOSPITAL_COMMUNITY): Payer: Self-pay

## 2020-04-15 ENCOUNTER — Other Ambulatory Visit: Payer: Self-pay

## 2020-04-15 ENCOUNTER — Emergency Department (HOSPITAL_COMMUNITY): Payer: Medicaid Other

## 2020-04-15 DIAGNOSIS — F1290 Cannabis use, unspecified, uncomplicated: Secondary | ICD-10-CM | POA: Insufficient documentation

## 2020-04-15 DIAGNOSIS — I509 Heart failure, unspecified: Secondary | ICD-10-CM | POA: Diagnosis not present

## 2020-04-15 DIAGNOSIS — E1043 Type 1 diabetes mellitus with diabetic autonomic (poly)neuropathy: Secondary | ICD-10-CM | POA: Diagnosis not present

## 2020-04-15 DIAGNOSIS — F419 Anxiety disorder, unspecified: Secondary | ICD-10-CM | POA: Insufficient documentation

## 2020-04-15 DIAGNOSIS — R569 Unspecified convulsions: Secondary | ICD-10-CM | POA: Insufficient documentation

## 2020-04-15 DIAGNOSIS — R109 Unspecified abdominal pain: Secondary | ICD-10-CM | POA: Diagnosis not present

## 2020-04-15 DIAGNOSIS — E1065 Type 1 diabetes mellitus with hyperglycemia: Secondary | ICD-10-CM | POA: Diagnosis not present

## 2020-04-15 DIAGNOSIS — K3184 Gastroparesis: Secondary | ICD-10-CM | POA: Diagnosis not present

## 2020-04-15 DIAGNOSIS — R112 Nausea with vomiting, unspecified: Secondary | ICD-10-CM | POA: Diagnosis not present

## 2020-04-15 DIAGNOSIS — R000 Tachycardia, unspecified: Secondary | ICD-10-CM | POA: Diagnosis not present

## 2020-04-15 DIAGNOSIS — R739 Hyperglycemia, unspecified: Secondary | ICD-10-CM

## 2020-04-15 DIAGNOSIS — R030 Elevated blood-pressure reading, without diagnosis of hypertension: Secondary | ICD-10-CM | POA: Insufficient documentation

## 2020-04-15 DIAGNOSIS — R079 Chest pain, unspecified: Secondary | ICD-10-CM | POA: Insufficient documentation

## 2020-04-15 DIAGNOSIS — Z9861 Coronary angioplasty status: Secondary | ICD-10-CM | POA: Insufficient documentation

## 2020-04-15 LAB — BASIC METABOLIC PANEL
Anion gap: 16 — ABNORMAL HIGH (ref 5–15)
Anion gap: 10 (ref 5–15)
BUN: 6 mg/dL (ref 6–20)
BUN: 6 mg/dL (ref 6–20)
CO2: 19 mmol/L — ABNORMAL LOW (ref 22–32)
CO2: 23 mmol/L (ref 22–32)
Calcium: 9.7 mg/dL (ref 8.9–10.3)
Calcium: 9.1 mg/dL (ref 8.9–10.3)
Chloride: 100 mmol/L (ref 98–111)
Chloride: 102 mmol/L (ref 98–111)
Creatinine, Ser: 0.72 mg/dL (ref 0.44–1.00)
Creatinine, Ser: 0.61 mg/dL (ref 0.44–1.00)
GFR, Estimated: >60 mL/min (ref >60)
GFR, Estimated: >60 mL/min (ref >60)
Glucose, Bld: 310 mg/dL — ABNORMAL HIGH (ref 70–99)
Glucose, Bld: 284 mg/dL — ABNORMAL HIGH (ref 70–99)
Potassium: 3.5 mmol/L (ref 3.5–5.1)
Potassium: 3.5 mmol/L (ref 3.5–5.1)
Sodium: 135 mmol/L (ref 135–145)
Sodium: 135 mmol/L (ref 135–145)

## 2020-04-15 LAB — CBC
HCT: 43.7 % (ref 36.0–46.0)
Hemoglobin: 14 g/dL (ref 12.0–15.0)
MCH: 24.7 pg — ABNORMAL LOW (ref 26.0–34.0)
MCHC: 32 g/dL (ref 30.0–36.0)
MCV: 77.1 fL — ABNORMAL LOW (ref 80.0–100.0)
Platelets: 340 10*3/uL (ref 150–400)
RBC: 5.67 MIL/uL — ABNORMAL HIGH (ref 3.87–5.11)
RDW: 15.6 % — ABNORMAL HIGH (ref 11.5–15.5)
WBC: 10.6 10*3/uL — ABNORMAL HIGH (ref 4.0–10.5)
nRBC: 0 % (ref 0.0–0.2)

## 2020-04-15 LAB — BETA-HYDROXYBUTYRIC ACID: Beta-Hydroxybutyric Acid: 2.34 mmol/L — ABNORMAL HIGH (ref 0.05–0.27)

## 2020-04-15 LAB — TROPONIN I (HIGH SENSITIVITY): Troponin I (High Sensitivity): 3 ng/L (ref <18)

## 2020-04-15 LAB — I-STAT BETA HCG BLOOD, ED (MC, WL, AP ONLY): I-stat hCG, quantitative: <5 m[IU]/mL (ref <5)

## 2020-04-15 MED ORDER — HALOPERIDOL LACTATE 5 MG/ML IJ SOLN
2.0000 mg | Freq: Once | INTRAMUSCULAR | Status: AC
  Administered 2020-04-16: 2 mg via INTRAVENOUS
  Filled 2020-04-15: qty 1

## 2020-04-15 MED ORDER — ONDANSETRON HCL 4 MG/2ML IJ SOLN
4.0000 mg | Freq: Once | INTRAMUSCULAR | Status: AC
  Administered 2020-04-15: 4 mg via INTRAVENOUS
  Filled 2020-04-15: qty 2

## 2020-04-15 MED ORDER — LORAZEPAM 1 MG PO TABS
1.0000 mg | ORAL_TABLET | Freq: Once | ORAL | Status: AC
  Administered 2020-04-15: 1 mg via ORAL
  Filled 2020-04-15: qty 1

## 2020-04-15 MED ORDER — INSULIN ASPART 100 UNIT/ML ~~LOC~~ SOLN
10.0000 [IU] | Freq: Once | SUBCUTANEOUS | Status: AC
  Administered 2020-04-15: 10 [IU] via SUBCUTANEOUS

## 2020-04-15 MED ORDER — SODIUM CHLORIDE 0.9 % IV BOLUS
1000.0000 mL | Freq: Once | INTRAVENOUS | Status: AC
  Administered 2020-04-15: 1000 mL via INTRAVENOUS

## 2020-04-15 MED ORDER — SODIUM CHLORIDE 0.9 % IV BOLUS
1000.0000 mL | Freq: Once | INTRAVENOUS | Status: AC
  Administered 2020-04-16: 1000 mL via INTRAVENOUS

## 2020-04-15 MED ORDER — HYDROMORPHONE HCL 1 MG/ML IJ SOLN
1.0000 mg | Freq: Once | INTRAMUSCULAR | Status: AC
  Administered 2020-04-15: 1 mg via INTRAVENOUS
  Filled 2020-04-15: qty 1

## 2020-04-15 MED ORDER — METOCLOPRAMIDE HCL 5 MG/ML IJ SOLN
10.0000 mg | Freq: Once | INTRAMUSCULAR | Status: AC
  Administered 2020-04-15: 10 mg via INTRAVENOUS
  Filled 2020-04-15: qty 2

## 2020-04-15 NOTE — ED Provider Notes (Signed)
  Provider Note MRN:  [id number]  Arrival date & time: 04/16/20    ED Course and Medical Decision Making  Assumed care from Dr. Denton Lank at shift change.  Nonepileptic seizures, gastroparesis, attempting symptom management and dc.  On my evaluation patient still feeling unwell, nauseated.  Provided IV Haldol.  On reassessment thereafter, patient is feeling much better, eating a sandwich.  Remains tachycardic, will receive further IV fluids.  1 AM update: Continues to feel well, no further vomiting, heart rate improved to 100.  Suspect we have rehydrated her and she is now safe for discharge.  Has GI follow-up. Procedures  Final Clinical Impressions(s) / ED Diagnoses     ICD-10-CM   1. Hyperglycemia  R73.9   2. Nausea and vomiting in adult  R11.2   3. Pseudoseizures (HCC)  R56.9   4. Gastroparesis  K31.84   5. Cannabinoid hyperemesis syndrome  R11.2    F12.90   6. Elevated blood pressure reading  R03.0     ED Discharge Orders    None        Discharge Instructions     It was our pleasure to provide your ER care today - we hope that you feel better.  Drink plenty of fluids/stay well hydrated. Continue diabetic meds, follow diabetes eating plan, monitor sugars and record values, and follow up with primary care doctor in 2-3 days. Also have your blood pressure rechecked then, as it is high today.   You may take zofran as need for nausea. Note the increasingly we are seeing marijuana use as a cause of a recurrent vomiting and abdominal pain syndrome called 'cannabinoid hyperemesis syndrome' - it is likely that abstaining from use may help prevent recurrent nausea and vomiting.   Return to ER if worse, new symptoms, fevers, new, worsening or severe pain, fainting, chest pain, trouble breathing, or other concern.        Elmer Sow. Pilar Plate, MD [facility] Emergency Medicine [facility] [email or web address]    Sabas Sous, MD 04/16/20 [phone]

## 2020-04-15 NOTE — ED Provider Notes (Signed)
[facility] EMERGENCY DEPARTMENT Provider Note   CSN: [id number] Arrival date & time: 04/15/20  2002     History Chief Complaint  Patient presents with  . Chest Pain    Nancy [name] is a 20 y.o. female.  Patient called EMS stating had seizure a few days ago, and since intermittent pain all over including chest/abd/diffuse body, intermittent nausea/vomiting. Pt poor historian, not cooperative w questions asked - level 5 caveat. EMS notes CBG 360. On arrival pt anxious, alert. No headache. No current chest pain. Denies cough or fever.  The history is provided by the patient and the EMS personnel. The history is limited by the condition of the patient.  Chest Pain Associated symptoms: abdominal pain and nausea   Associated symptoms: no cough, no fever and no headache        Past Medical History:  Diagnosis Date  . Acanthosis nigricans   . Anxiety   . CHF (congestive heart failure) (HCC)   . Chronic lower back pain   . Depression   . DKA, type 1 (HCC) 09/13/2018  . Dyspepsia   . Obesity   . Ovarian cyst    pt is not aware of this hx (11/24/2017)  . 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 Premature baby   . Seizures (HCC)   . Type II diabetes mellitus (HCC)    insulin dependant    Patient Active Problem List   Diagnosis Date Noted  . Abdominal pain 02/22/2020  . Chronic constipation 02/22/2020  . Nausea & vomiting 02/22/2020  . SIRS (systemic inflammatory response syndrome) (HCC) 01/14/2020  . Diabetic gastroparesis (HCC) 11/25/2019  . Polysubstance abuse (HCC) 11/25/2019  . Gastritis 11/25/2019  . DKA, type 1 (HCC) 11/24/2019  . Cyclical vomiting with nausea 10/01/2019  . Intractable nausea and vomiting 09/30/2019  . Cellulitis   . DKA (diabetic ketoacidoses) 01/27/2019  . Leukocytosis 12/19/2018  . Dehydration   . Pseudoseizures (HCC)   . Type 1 diabetes mellitus with ketoacidosis without coma (HCC) 11/28/2018  . Hyperglycemia 11/27/2018  .  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  . MDD (major depressive disorder), recurrent episode, severe (HCC) 11/06/2018  . Nonspecific abnormal electrocardiogram (ECG) (EKG)   . Chest pain of uncertain etiology   . Hypertensive urgency 10/08/2018  . Conversion disorder with attacks or seizures, acute episode, with psychological stressor 09/16/2018  . MDD (major depressive disorder), recurrent severe, without psychosis (HCC)   . AKI (acute kidney injury) (HCC) 08/04/2018  . Seizure-like activity (HCC) 08/03/2018  . Depression 07/25/2018  . Syncope 01/30/2018  . Orthostatic hypotension 01/24/2018  . Tachycardia 12/28/2017  . Chronic abdominal pain 12/24/2017  . Chest pain 12/19/2017  . Nausea and vomiting 08/21/2017  . Generalized abdominal pain 08/21/2017  . Non compliance with medical treatment 01/27/2012  . Adjustment disorder 09/16/2011  . Acanthosis nigricans   . Goiter   . Obesity 06/14/2010  . Hypertension 06/14/2010    Past Surgical History:  Procedure Laterality Date  . ABDOMINAL HERNIA REPAIR     "I was a baby"  . BIOPSY  10/12/2018   Procedure: BIOPSY;  Surgeon: Lynann Bologna, MD;  Location: [facility] ENDOSCOPY;  Service: Endoscopy;;  . BIOPSY  02/28/2020   Procedure: BIOPSY;  Surgeon: Shellia Cleverly, DO;  Location: [facility] ENDOSCOPY;  Service: Gastroenterology;;  . ESOPHAGOGASTRODUODENOSCOPY (EGD) WITH PROPOFOL N/A 10/12/2018   Procedure: ESOPHAGOGASTRODUODENOSCOPY (EGD) WITH PROPOFOL;  Surgeon: Lynann Bologna, MD;  Location: [facility] ENDOSCOPY;  Service: Endoscopy;  Laterality: N/A;  . ESOPHAGOGASTRODUODENOSCOPY (EGD) WITH PROPOFOL N/A 02/28/2020   Procedure: ESOPHAGOGASTRODUODENOSCOPY (EGD) WITH PROPOFOL;  Surgeon: Shellia Cleverly, DO;  Location: [facility] ENDOSCOPY;  Service: Gastroenterology;  Laterality: N/A;  . FLEXIBLE SIGMOIDOSCOPY N/A 02/28/2020   Procedure: FLEXIBLE SIGMOIDOSCOPY;  Surgeon: Shellia Cleverly, DO;  Location: [facility] ENDOSCOPY;   Service: Gastroenterology;  Laterality: N/A;  . HERNIA REPAIR    . LEFT HEART CATH AND CORONARY ANGIOGRAPHY N/A 10/13/2018   Procedure: LEFT HEART CATH AND CORONARY ANGIOGRAPHY;  Surgeon: Kathleene Hazel, MD;  Location: [facility] INVASIVE CV LAB;  Service: Cardiovascular;  Laterality: N/A;  . TONSILLECTOMY AND ADENOIDECTOMY    . WISDOM TOOTH EXTRACTION  2017     OB History   No obstetric history on file.     Family History  Problem Relation Age of Onset  . Diabetes Mother   . Hypertension Mother   . Obesity Mother   . Asthma Mother   . Allergic rhinitis Mother   . Eczema Mother   . Cervical cancer Mother   . Diabetes Father   . Hypertension Father   . Obesity Father   . Hyperlipidemia Father   . Hypertension Paternal Aunt   . Hypertension Maternal Grandfather   . Colon cancer Maternal Grandfather   . Diabetes Paternal Grandmother   . Obesity Paternal Grandmother   . Diabetes Paternal Grandfather   . Obesity Paternal Grandfather   . Angioedema Neg Hx   . Immunodeficiency Neg Hx   . Urticaria Neg Hx   . Stomach cancer Neg Hx   . Esophageal cancer Neg Hx     Social History   Tobacco Use  . Smoking status: Never Smoker  . Smokeless tobacco: Never Used  Vaping Use  . Vaping Use: Never used  Substance Use Topics  . Alcohol use: No    Alcohol/week: 0.0 standard drinks  . Drug use: No    Home Medications Prior to Admission medications   Medication Sig Start Date End Date Taking? Authorizing Provider  ARIPiprazole (ABILIFY) 10 MG tablet Take 10 mg by mouth in the morning and at bedtime.     [provider]  atorvastatin (LIPITOR) 20 MG tablet Take 1 tablet (20 mg total) by mouth daily at 6 PM. Patient taking differently: Take 20 mg by mouth daily. 10/05/18   Storm Frisk, MD  busPIRone (BUSPAR) 10 MG tablet Take 10 mg by mouth 2 (two) times daily.  11/19/18   [provider]  capsaicin (ZOSTRIX) 0.025 % cream Apply topically 2 (two) times  daily. 02/29/20   Ghimire, Werner Lean, MD  capsaicin (ZOSTRIX) 0.025 % cream APPLY TOPICALLY TWO TIMES DAILY. 02/29/20 02/28/21  Ghimire, Werner Lean, MD  Cyanocobalamin (VITAMIN B-12 PO) Take 1 tablet by mouth daily.    [provider]  diclofenac Sodium (VOLTAREN) 1 % GEL Apply 1 application topically 2 (two) times daily as needed (chest pains).    [provider]  insulin aspart (NOVOLOG FLEXPEN) 100 UNIT/ML FlexPen Inject 15 Units into the skin 2 (two) times daily before a meal.    [provider]  insulin glargine (LANTUS) 100 UNIT/ML Solostar Pen INJECT 30 UNITS INTO THE SKIN AT BEDTIME 02/29/20 02/28/21  Ghimire, Werner Lean, MD  insulin glargine (LANTUS) 100 unit/mL SOPN Inject 30 Units into the skin at bedtime. 02/29/20   Ghimire, Werner Lean, MD  lamoTRIgine (LAMICTAL) 25 MG tablet Take 2 tablets (50 mg total) by mouth 2 (two) times daily. For mood stabilization 11/11/18  Armandina Stammer I, NP  lidocaine (LIDODERM) 5 % Place 1 patch onto the skin daily as needed (pain). Remove & Discard patch within 12 hours or as directed by MD    [provider]  lubiprostone (AMITIZA) 24 MCG capsule Take 1 capsule (24 mcg total) by mouth 2 (two) times daily with a meal. Patient taking differently: Take 24 mcg by mouth daily with breakfast. 10/27/18   Esterwood, Amy S, PA-C  metoCLOPramide (REGLAN) 5 MG tablet Take 1 tablet (5 mg total) by mouth 3 (three) times daily. 10/03/19   Jerald Kief, MD  mirtazapine (REMERON) 7.5 MG tablet Take 1 tablet (7.5 mg total) by mouth at bedtime. For depression/sleep 11/11/18   Armandina Stammer I, NP  Multiple Vitamin (MULTIVITAMIN WITH MINERALS) TABS tablet Take 1 tablet by mouth daily. 09/18/18   Clapacs, Jackquline Denmark, MD  pantoprazole (PROTONIX) 40 MG tablet Take 1 tablet (40 mg total) by mouth 2 (two) times daily. For acid reflux 01/29/19   Jae Dire, MD  polyethylene glycol (MIRALAX / GLYCOLAX) 17 g packet Take 17 g by mouth 2 (two) times daily. 01/16/20    Swayze, Ava, DO  promethazine (PHENERGAN) 25 MG suppository PLACE 1 SUPPOSITORY (25 MG TOTAL) RECTALLY EVERY 6 (SIX) HOURS AS NEEDED FOR NAUSEA OR VOMITING. 02/29/20 02/28/21  Ghimire, Werner Lean, MD  propranolol (INDERAL) 20 MG tablet Take 1 tablet (20 mg total) by mouth 2 (two) times daily. For anxiety/HTN 12/23/18   Swayze, Ava, DO  QUEtiapine (SEROQUEL) 100 MG tablet Take 300 mg by mouth at bedtime. 07/05/19   [provider]  QUEtiapine (SEROQUEL) 25 MG tablet Take 25 mg by mouth every morning.    [provider]  senna-docusate (SENOKOT-S) 8.6-50 MG tablet Take 2 tablets by mouth at bedtime. 02/29/20   Ghimire, Werner Lean, MD  senna-docusate (SENOKOT-S) 8.6-50 MG tablet TAKE 2 TABLETS BY MOUTH AT BEDTIME. 02/29/20 02/28/21  Ghimire, Werner Lean, MD  sucralfate (CARAFATE) 1 GM/10ML suspension Take 1 g by mouth at bedtime as needed (stomach pain). 07/05/19   [provider]  topiramate (TOPAMAX) 25 MG tablet Take 1 tablet (25 mg total) by mouth 2 (two) times daily. For seizure activities 11/11/18   Armandina Stammer I, NP  prochlorperazine (COMPAZINE) 25 MG suppository PLACE 1 SUPPOSITORY (25 MG TOTAL) RECTALLY EVERY TWELVE HOURS AS NEEDED FOR NAUSEA OR VOMITING. 02/29/20 02/29/20  Ghimire, Werner Lean, MD    Allergies    Tomato, Ibuprofen, and Oatmeal  Review of Systems   Review of Systems  Constitutional: Negative for fever.  Eyes: Negative for visual disturbance.  Respiratory: Negative for cough.   Cardiovascular: Positive for chest pain.  Gastrointestinal: Positive for abdominal pain and nausea.  Neurological: Negative for headaches.  level 5 caveat - pt poorly cooperative w ros.   Physical Exam Updated Vital Signs BP (!) 161/99 (BP Location: Right Arm)   Pulse (!) 123   Temp 98.4 F (36.9 C) (Oral)   Resp (!) 25   Ht 1.6 m (5\' 3" )   Wt 87.5 kg   SpO2 100%   BMI 34.19 kg/m   Physical Exam Vitals and nursing note reviewed.  Constitutional:      Appearance:  Normal appearance. She is well-developed.  HENT:     Head: Atraumatic.     Nose: Nose normal.     Mouth/Throat:     Mouth: Mucous membranes are moist.  Eyes:     General: No scleral icterus.    Conjunctiva/sclera: Conjunctivae  normal.     Pupils: Pupils are equal, round, and reactive to light.  Neck:     Vascular: No carotid bruit.     Trachea: No tracheal deviation.  Cardiovascular:     Rate and Rhythm: Regular rhythm. Tachycardia present.     Pulses: Normal pulses.     Heart sounds: Normal heart sounds. No murmur heard. No friction rub. No gallop.   Pulmonary:     Effort: Pulmonary effort is normal. No respiratory distress.     Breath sounds: Normal breath sounds.     Comments: Mild chest wall tenderness. No crepitus.  Chest:     Chest wall: Tenderness present.  Abdominal:     General: Bowel sounds are normal. There is no distension.     Palpations: Abdomen is soft.     Tenderness: There is no abdominal tenderness. There is no guarding.  Genitourinary:    Comments: No cva tenderness.  Musculoskeletal:        General: No swelling or tenderness.     Cervical back: Normal range of motion and neck supple. No rigidity. No muscular tenderness.     Right lower leg: No edema.     Left lower leg: No edema.  Skin:    General: Skin is warm and dry.     Findings: No rash.  Neurological:     Mental Status: She is alert.     Comments: Patient arrives to room, appears anxious, hyperventilating. Pt with episode of rolling eyes upward, and shaking - appears c/w pseudoseizure and not true seizure. Pt arouses to voice/command.   Psychiatric:     Comments: Anxious.      ED Results / Procedures / Treatments   Labs (all labs ordered are listed, but only abnormal results are displayed) Results for orders placed or performed during the hospital encounter of 04/15/20  Basic metabolic panel  Result Value Ref Range   Sodium 135 135 - 145 mmol/L   Potassium 3.5 3.5 - 5.1 mmol/L   Chloride  100 98 - 111 mmol/L   CO2 19 (L) 22 - 32 mmol/L   Glucose, Bld 310 (H) 70 - 99 mg/dL   BUN 6 6 - 20 mg/dL   Creatinine, Ser 1.61 0.44 - 1.00 mg/dL   Calcium 9.7 8.9 - 09.6 mg/dL   GFR, Estimated >04 >54 mL/min   Anion gap 16 (H) 5 - 15  CBC  Result Value Ref Range   WBC 10.6 (H) 4.0 - 10.5 K/uL   RBC 5.67 (H) 3.87 - 5.11 MIL/uL   Hemoglobin 14.0 12.0 - 15.0 g/dL   HCT 09.8 11.9 - 14.7 %   MCV 77.1 (L) 80.0 - 100.0 fL   MCH 24.7 (L) 26.0 - 34.0 pg   MCHC 32.0 30.0 - 36.0 g/dL   RDW 82.9 (H) 56.2 - 13.0 %   Platelets 340 150 - 400 K/uL   nRBC 0.0 0.0 - 0.2 %  I-Stat beta hCG blood, ED  Result Value Ref Range   I-stat hCG, quantitative <5.0 <5 mIU/mL   Comment 3          Troponin I (High Sensitivity)  Result Value Ref Range   Troponin I (High Sensitivity) 3 <18 ng/L   DG Chest 2 View  Result Date: 04/15/2020 CLINICAL DATA:  Chest pain EXAM: CHEST - 2 VIEW COMPARISON:  02/21/2020 FINDINGS: The heart size and mediastinal contours are within normal limits. Both lungs are clear. The visualized skeletal structures are unremarkable. IMPRESSION: No  active cardiopulmonary disease. Electronically Signed   By: Jasmine Pang M.D.   On: 04/15/2020 20:59   EEG adult  Result Date: 04/10/2020 Charlsie Quest, MD     04/10/2020  1:28 PM Patient Name: Nancy [name] MRN: [id number] Epilepsy Attending: Charlsie Quest Referring Physician/Provider: Lanae Boast, NP Date: 04/10/2020 Duration: 23.11 minutes  Patient history: 20 year old female with whole-body shaking seizure-like episodes.  EEG evaluate for seizures.  Level of alertness: Awake  AEDs during EEG study: Topamax, lamotrigine  Technical aspects: This EEG study was done with scalp electrodes positioned according to the 10-20 International system of electrode placement. Electrical activity was acquired at a sampling rate of 500Hz  and reviewed with a high frequency filter of 70Hz  and a low frequency filter of 1Hz . EEG data were recorded  continuously and digitally stored.  Description: The posterior dominant rhythm consists of 8-9 Hz activity of moderate voltage (25-35 uV) seen predominantly in posterior head regions, symmetric and reactive to eye opening and eye closing.  Sleep was characterized by sleep symptoms (12 to 14 Hz) maximal frontocentral region.  Physiologic photic driving was seen during photic stimulation.  Hyperventilation was not performed. IMPRESSION: This study is within normal limits. No seizures or epileptiform discharges were seen throughout the recording.  Charlsie Quest   CT HEAD CODE STROKE WO CONTRAST  Result Date: 04/10/2020 CLINICAL DATA:  Code stroke.  Right-sided weakness EXAM: CT HEAD WITHOUT CONTRAST TECHNIQUE: Contiguous axial images were obtained from the base of the skull through the vertex without intravenous contrast. COMPARISON:  September 2021 FINDINGS: Brain: There is no acute intracranial hemorrhage, mass effect, or edema. Gray-white differentiation is preserved. Small foci of low attenuation in the frontoparietal white matter appear to correspond to mildly prominent perivascular spaces on prior MRI. There is no hydrocephalus. No extra-axial collection. Vascular: No hyperdense vessel. Skull: Unremarkable. Sinuses/Orbits: No acute abnormality. Other: Mastoid air cells are clear. ASPECTS (Alberta Stroke Program Early CT Score) - Ganglionic level infarction (caudate, lentiform nuclei, internal capsule, insula, M1-M3 cortex): 7 - Supraganglionic infarction (M4-M6 cortex): 3 Total score (0-10 with 10 being normal): 10 IMPRESSION: There is no acute intracranial hemorrhage or evidence of acute infarction. ASPECT score is 10. These results were communicated to Dr. Wilford Corner at 11:15 am on 04/10/2020 by text page via the [facility] messaging system. Electronically Signed   By: Guadlupe Spanish M.D.   On: 04/10/2020 11:19    EKG EKG Interpretation  Date/Time:  Sunday April 15 2020 20:22:18 EDT Ventricular Rate:   126 PR Interval:  126 QRS Duration: 78 QT Interval:  322 QTC Calculation: 466 R Axis:   92 Text Interpretation: Sinus tachycardia Confirmed by Cathren Laine ([id number]) on 04/15/2020 8:38:05 PM   Radiology No results found.  Procedures Procedures   Medications Ordered in ED Medications  LORazepam (ATIVAN) tablet 1 mg (has no administration in time range)    ED Course  I have reviewed the triage vital signs and the nursing notes.  Pertinent labs & imaging results that were available during my care of the patient were reviewed by me and considered in my medical decision making (see chart for details).    MDM Rules/Calculators/A&P                         Iv ns. Continuous pulse ox and cardiac monitoring. Stat labs.   Reviewed nursing notes and prior charts for additional history. History multiple prior ED visits with functional unresponsiveness and pseudoseizure.  Labs reviewed/interpreted by me - glucose elevated 300. hco3 is slightly low, 19.  novolog sq. Iv ns bolus.   Pt is anxious, notes hx same. Ativan 1 mg po.   On recheck, pt more cooperative, conversant, answering questions appropriately. Patient indicates hx gastroparesis, and recurrent nausea/vomiting, and epigastric pain in the past 1-2 days, c/w her prior symptoms. Pt is also noted with hx chronic thc use, and ?whether cannabinoid hyperemesis also contributing to her recurrent symptoms. reglan iv. Pt requests pain meds. Dilaudid 1 mg iv.   zofran iv. Additional iv ns bolus.   2305, bhba/ua pending, repeat bmet pending, ivf - signed out to Dr Pilar PlateBero to check pending labs when resulted, recheck pt, and dispo appropriately.  Final Clinical Impression(s) / ED Diagnoses Final diagnoses:  None    Rx / DC Orders ED Discharge Orders    None       Cathren LaineSteinl, Tonio Seider, MD 04/15/20 2309

## 2020-04-15 NOTE — ED Triage Notes (Signed)
Pt bib [facility] from home. Pt states she was seen for a seizure a few days ago and has been feeling bad since then. Pt c/o chest pain, abdominal pain, nausea, vomiting, and shortness of breath. Pt moving around in triage, difficult to get VS and EKG. Pt asking for somewhere to lay down. EMS gave zofran 4mg  IM. Pt states she feels like she is going to have a seizure. Pt took 81mg  x3 ASA, reglan, phenergan and zofran at home.   EMS VS: 136/120 HR=126 cbg=360

## 2020-04-15 NOTE — Discharge Instructions (Addendum)
It was our pleasure to provide your ER care today - we hope that you feel better.  Drink plenty of fluids/stay well hydrated. Continue diabetic meds, follow diabetes eating plan, monitor sugars and record values, and follow up with primary care doctor in 2-3 days. Also have your blood pressure rechecked then, as it is high today.   You may take zofran as need for nausea. Note the increasingly we are seeing marijuana use as a cause of a recurrent vomiting and abdominal pain syndrome called 'cannabinoid hyperemesis syndrome' - it is likely that abstaining from use may help prevent recurrent nausea and vomiting.   Return to ER if worse, new symptoms, fevers, new, worsening or severe pain, fainting, chest pain, trouble breathing, or other concern.

## 2020-04-16 NOTE — ED Notes (Signed)
Pt given a sandwich bag and fluids.

## 2020-07-22 ENCOUNTER — Other Ambulatory Visit: Payer: Self-pay

## 2020-07-22 ENCOUNTER — Emergency Department (HOSPITAL_COMMUNITY)
Admission: EM | Admit: 2020-07-22 | Discharge: 2020-07-22 | Disposition: A | Discharge status: Home / Self Care | Payer: Medicaid Other | Attending: Emergency Medicine | Admitting: Emergency Medicine

## 2020-07-22 ENCOUNTER — Encounter (HOSPITAL_COMMUNITY): Payer: Self-pay | Admitting: Emergency Medicine

## 2020-07-22 DIAGNOSIS — I110 Hypertensive heart disease with heart failure: Secondary | ICD-10-CM | POA: Insufficient documentation

## 2020-07-22 DIAGNOSIS — Z79899 Other long term (current) drug therapy: Secondary | ICD-10-CM | POA: Insufficient documentation

## 2020-07-22 DIAGNOSIS — E1010 Type 1 diabetes mellitus with ketoacidosis without coma: Secondary | ICD-10-CM | POA: Diagnosis not present

## 2020-07-22 DIAGNOSIS — Z794 Long term (current) use of insulin: Secondary | ICD-10-CM | POA: Diagnosis not present

## 2020-07-22 DIAGNOSIS — I509 Heart failure, unspecified: Secondary | ICD-10-CM | POA: Insufficient documentation

## 2020-07-22 DIAGNOSIS — R569 Unspecified convulsions: Secondary | ICD-10-CM | POA: Diagnosis not present

## 2020-07-22 DIAGNOSIS — R109 Unspecified abdominal pain: Secondary | ICD-10-CM | POA: Insufficient documentation

## 2020-07-22 DIAGNOSIS — G8929 Other chronic pain: Secondary | ICD-10-CM | POA: Diagnosis not present

## 2020-07-22 DIAGNOSIS — E860 Dehydration: Secondary | ICD-10-CM | POA: Insufficient documentation

## 2020-07-22 LAB — CBC WITH DIFFERENTIAL/PLATELET
Abs Immature Granulocytes: 0.03 10*3/uL (ref 0.00–0.07)
Basophils Absolute: 0 10*3/uL (ref 0.0–0.1)
Basophils Relative: 0 %
Eosinophils Absolute: 0 10*3/uL (ref 0.0–0.5)
Eosinophils Relative: 0 %
HCT: 37.9 % (ref 36.0–46.0)
Hemoglobin: 11.8 g/dL — ABNORMAL LOW (ref 12.0–15.0)
Immature Granulocytes: 0 %
Lymphocytes Relative: 14 %
Lymphs Abs: 1.2 10*3/uL (ref 0.7–4.0)
MCH: 25.5 pg — ABNORMAL LOW (ref 26.0–34.0)
MCHC: 31.1 g/dL (ref 30.0–36.0)
MCV: 82 fL (ref 80.0–100.0)
Monocytes Absolute: 0.4 10*3/uL (ref 0.1–1.0)
Monocytes Relative: 4 %
Neutro Abs: 7.4 10*3/uL (ref 1.7–7.7)
Neutrophils Relative %: 82 %
Platelets: 353 10*3/uL (ref 150–400)
RBC: 4.62 MIL/uL (ref 3.87–5.11)
RDW: 16.5 % — ABNORMAL HIGH (ref 11.5–15.5)
WBC: 9.1 10*3/uL (ref 4.0–10.5)
nRBC: 0 % (ref 0.0–0.2)

## 2020-07-22 LAB — COMPREHENSIVE METABOLIC PANEL
ALT: 14 U/L (ref 0–44)
AST: 20 U/L (ref 15–41)
Albumin: 4.1 g/dL (ref 3.5–5.0)
Alkaline Phosphatase: 81 U/L (ref 38–126)
Anion gap: 11 (ref 5–15)
BUN: 10 mg/dL (ref 6–20)
CO2: 23 mmol/L (ref 22–32)
Calcium: 9.2 mg/dL (ref 8.9–10.3)
Chloride: 102 mmol/L (ref 98–111)
Creatinine, Ser: 0.47 mg/dL (ref 0.44–1.00)
GFR, Estimated: >60 mL/min (ref >60)
Glucose, Bld: 222 mg/dL — ABNORMAL HIGH (ref 70–99)
Potassium: 3.8 mmol/L (ref 3.5–5.1)
Sodium: 136 mmol/L (ref 135–145)
Total Bilirubin: 1.1 mg/dL (ref 0.3–1.2)
Total Protein: 7.8 g/dL (ref 6.5–8.1)

## 2020-07-22 LAB — URINALYSIS, ROUTINE W REFLEX MICROSCOPIC
Bacteria, UA: NONE SEEN
Bilirubin Urine: NEGATIVE
Glucose, UA: >=500 mg/dL — AB
Hgb urine dipstick: NEGATIVE
Ketones, ur: 80 mg/dL — AB
Leukocytes,Ua: NEGATIVE
Nitrite: NEGATIVE
Protein, ur: NEGATIVE mg/dL
Specific Gravity, Urine: 1.02 (ref 1.005–1.030)
pH: 7 (ref 5.0–8.0)

## 2020-07-22 LAB — RAPID URINE DRUG SCREEN, HOSP PERFORMED
Amphetamines: NOT DETECTED
Barbiturates: NOT DETECTED
Benzodiazepines: NOT DETECTED
Cocaine: NOT DETECTED
Opiates: POSITIVE — AB
Tetrahydrocannabinol: POSITIVE — AB

## 2020-07-22 LAB — LIPASE, BLOOD: Lipase: 24 U/L (ref 11–51)

## 2020-07-22 LAB — HCG, QUANTITATIVE, PREGNANCY: hCG, Beta Chain, Quant, S: <1 m[IU]/mL (ref <5)

## 2020-07-22 MED ORDER — LORAZEPAM 2 MG/ML IJ SOLN
1.0000 mg | Freq: Once | INTRAMUSCULAR | Status: AC
  Administered 2020-07-22: 1 mg via INTRAVENOUS
  Filled 2020-07-22: qty 1

## 2020-07-22 MED ORDER — FENTANYL CITRATE (PF) 100 MCG/2ML IJ SOLN
50.0000 ug | Freq: Once | INTRAMUSCULAR | Status: AC
  Administered 2020-07-22: 50 ug via INTRAVENOUS
  Filled 2020-07-22: qty 2

## 2020-07-22 MED ORDER — METOCLOPRAMIDE HCL 5 MG/ML IJ SOLN
5.0000 mg | Freq: Once | INTRAMUSCULAR | Status: AC
  Administered 2020-07-22: 5 mg via INTRAVENOUS
  Filled 2020-07-22: qty 2

## 2020-07-22 MED ORDER — LACTATED RINGERS IV BOLUS
1000.0000 mL | Freq: Once | INTRAVENOUS | Status: AC
  Administered 2020-07-22: 1000 mL via INTRAVENOUS

## 2020-07-22 MED ORDER — HYDROMORPHONE HCL 1 MG/ML IJ SOLN
0.5000 mg | Freq: Once | INTRAMUSCULAR | Status: AC
  Administered 2020-07-22: 0.5 mg via INTRAVENOUS
  Filled 2020-07-22: qty 1

## 2020-07-22 MED ORDER — LACTATED RINGERS IV SOLN: INTRAVENOUS | Status: DC

## 2020-07-22 NOTE — Discharge Instructions (Addendum)
Drink plenty of fluids and follow-up with your doctor as needed. °

## 2020-07-22 NOTE — ED Provider Notes (Signed)
[facility]-EMERGENCY DEPT Provider Note   CSN: [id number] Arrival date & time: 07/22/20  2353     History Chief Complaint  Patient presents with   Seizures    Nancy [name] is a 20 y.o. female.  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  Patient states that she has seizures daily but he had continues to hold a job.  Notes that she takes Lamictal but has been having issues with chronic vomiting for quite some time.  Denies any fever or chills.  Notes diffuse abdominal discomfort with this.  No urinary symptoms.  No vaginal bleeding or discharge.  EMS called and patient given Versed.  Blood sugar was above 100.      Past Medical History:  Diagnosis Date   Acanthosis nigricans    Anxiety    CHF (congestive heart failure) (HCC)    Chronic lower back pain    Depression    DKA, type 1 (HCC) 09/13/2018   Dyspepsia    Obesity    Ovarian cyst    pt is not aware of this hx (11/24/2017)   Precocious adrenarche (HCC)    Premature baby    Seizures (HCC)    Type II diabetes mellitus (HCC)    insulin dependant    Patient Active Problem List   Diagnosis Date Noted   Abdominal pain 02/22/2020   Chronic constipation 02/22/2020   Nausea & vomiting 02/22/2020   SIRS (systemic inflammatory response syndrome) (HCC) 01/14/2020   Diabetic gastroparesis (HCC) 11/25/2019   Polysubstance abuse (HCC) 11/25/2019   Gastritis 11/25/2019   DKA, type 1 (HCC) 11/24/2019   Cyclical vomiting with nausea 10/01/2019   Intractable nausea and vomiting 09/30/2019   Cellulitis    DKA (diabetic ketoacidoses) 01/27/2019   Leukocytosis 12/19/2018   Dehydration    Pseudoseizures (HCC)    Type 1 diabetes mellitus with ketoacidosis without coma (HCC) 11/28/2018   Hyperglycemia 11/27/2018   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   MDD (major depressive disorder), recurrent  episode, severe (HCC) 11/06/2018   Nonspecific abnormal electrocardiogram (ECG) (EKG)    Chest pain of uncertain etiology    Hypertensive urgency 10/08/2018   Conversion disorder with attacks or seizures, acute episode, with psychological stressor 09/16/2018   MDD (major depressive disorder), recurrent severe, without psychosis (HCC)    AKI (acute kidney injury) (HCC) 08/04/2018   Seizure-like activity (HCC) 08/03/2018   Depression 07/25/2018   Syncope 01/30/2018   Orthostatic hypotension 01/24/2018   Tachycardia 12/28/2017   Chronic abdominal pain 12/24/2017   Chest pain 12/19/2017   Nausea and vomiting 08/21/2017   Generalized abdominal pain 08/21/2017   Non compliance with medical treatment 01/27/2012   Adjustment disorder 09/16/2011   Acanthosis nigricans    Goiter    Obesity 06/14/2010   Hypertension 06/14/2010    Past Surgical History:  Procedure Laterality Date   ABDOMINAL HERNIA REPAIR     "I was a baby"   BIOPSY  10/12/2018   Procedure: BIOPSY;  Surgeon: Lynann Bologna, MD;  Location: [facility] ENDOSCOPY;  Service: Endoscopy;;   BIOPSY  02/28/2020   Procedure: BIOPSY;  Surgeon: Shellia Cleverly, DO;  Location: [facility] ENDOSCOPY;  Service: Gastroenterology;;   ESOPHAGOGASTRODUODENOSCOPY (EGD) WITH PROPOFOL N/A 10/12/2018   Procedure: ESOPHAGOGASTRODUODENOSCOPY (EGD) WITH PROPOFOL;  Surgeon: Lynann Bologna, MD;  Location: [facility] ENDOSCOPY;  Service: Endoscopy;  Laterality: N/A;   ESOPHAGOGASTRODUODENOSCOPY (EGD) WITH PROPOFOL N/A 02/28/2020  Procedure: ESOPHAGOGASTRODUODENOSCOPY (EGD) WITH PROPOFOL;  Surgeon: Shellia Cleverly, DO;  Location: [facility] ENDOSCOPY;  Service: Gastroenterology;  Laterality: N/A;   FLEXIBLE SIGMOIDOSCOPY N/A 02/28/2020   Procedure: FLEXIBLE SIGMOIDOSCOPY;  Surgeon: Shellia Cleverly, DO;  Location: [facility] ENDOSCOPY;  Service: Gastroenterology;  Laterality: N/A;   HERNIA REPAIR     LEFT HEART CATH AND CORONARY ANGIOGRAPHY N/A 10/13/2018   Procedure: LEFT HEART CATH AND  CORONARY ANGIOGRAPHY;  Surgeon: Kathleene Hazel, MD;  Location: [facility] INVASIVE CV LAB;  Service: Cardiovascular;  Laterality: N/A;   TONSILLECTOMY AND ADENOIDECTOMY     WISDOM TOOTH EXTRACTION  2017     OB History   No obstetric history on file.     Family History  Problem Relation Age of Onset   Diabetes Mother    Hypertension Mother    Obesity Mother    Asthma Mother    Allergic rhinitis Mother    Eczema Mother    Cervical cancer Mother    Diabetes Father    Hypertension Father    Obesity Father    Hyperlipidemia Father    Hypertension Paternal Aunt    Hypertension Maternal Grandfather    Colon cancer Maternal Grandfather    Diabetes Paternal Grandmother    Obesity Paternal Grandmother    Diabetes Paternal Grandfather    Obesity Paternal Grandfather    Angioedema Neg Hx    Immunodeficiency Neg Hx    Urticaria Neg Hx    Stomach cancer Neg Hx    Esophageal cancer Neg Hx     Social History   Tobacco Use   Smoking status: Never   Smokeless tobacco: Never  Vaping Use   Vaping Use: Never used  Substance Use Topics   Alcohol use: No    Alcohol/week: 0.0 standard drinks   Drug use: No    Home Medications Prior to Admission medications   Medication Sig Start Date End Date Taking? Authorizing Provider  ARIPiprazole (ABILIFY) 10 MG tablet Take 10 mg by mouth in the morning and at bedtime.     [provider]  atorvastatin (LIPITOR) 20 MG tablet Take 1 tablet (20 mg total) by mouth daily at 6 PM. Patient taking differently: Take 20 mg by mouth daily. 10/05/18   Storm Frisk, MD  busPIRone (BUSPAR) 10 MG tablet Take 10 mg by mouth 2 (two) times daily.  11/19/18   [provider]  capsaicin (ZOSTRIX) 0.025 % cream Apply topically 2 (two) times daily. 02/29/20   Ghimire, Werner Lean, MD  capsaicin (ZOSTRIX) 0.025 % cream APPLY TOPICALLY TWO TIMES DAILY. 02/29/20 02/28/21  Ghimire, Werner Lean, MD  Cyanocobalamin (VITAMIN B-12 PO) Take 1 tablet by  mouth daily.    [provider]  diclofenac Sodium (VOLTAREN) 1 % GEL Apply 1 application topically 2 (two) times daily as needed (chest pains).    [provider]  insulin aspart (NOVOLOG FLEXPEN) 100 UNIT/ML FlexPen Inject 15 Units into the skin 2 (two) times daily before a meal.    [provider]  insulin glargine (LANTUS) 100 UNIT/ML Solostar Pen INJECT 30 UNITS INTO THE SKIN AT BEDTIME 02/29/20 02/28/21  Ghimire, Werner Lean, MD  insulin glargine (LANTUS) 100 unit/mL SOPN Inject 30 Units into the skin at bedtime. 02/29/20   Ghimire, Werner Lean, MD  lamoTRIgine (LAMICTAL) 25 MG tablet Take 2 tablets (50 mg total) by mouth 2 (two) times daily. For mood stabilization 11/11/18   Armandina Stammer I, NP  lidocaine (LIDODERM) 5 % Place 1 patch  onto the skin daily as needed (pain). Remove & Discard patch within 12 hours or as directed by MD    [provider]  lubiprostone (AMITIZA) 24 MCG capsule Take 1 capsule (24 mcg total) by mouth 2 (two) times daily with a meal. Patient taking differently: Take 24 mcg by mouth daily with breakfast. 10/27/18   Esterwood, Amy S, PA-C  metoCLOPramide (REGLAN) 5 MG tablet Take 1 tablet (5 mg total) by mouth 3 (three) times daily. 10/03/19   Jerald Kief, MD  mirtazapine (REMERON) 7.5 MG tablet Take 1 tablet (7.5 mg total) by mouth at bedtime. For depression/sleep 11/11/18   Armandina Stammer I, NP  Multiple Vitamin (MULTIVITAMIN WITH MINERALS) TABS tablet Take 1 tablet by mouth daily. 09/18/18   Clapacs, Jackquline Denmark, MD  pantoprazole (PROTONIX) 40 MG tablet Take 1 tablet (40 mg total) by mouth 2 (two) times daily. For acid reflux 01/29/19   Jae Dire, MD  polyethylene glycol (MIRALAX / GLYCOLAX) 17 g packet Take 17 g by mouth 2 (two) times daily. 01/16/20   Swayze, Ava, DO  promethazine (PHENERGAN) 25 MG suppository PLACE 1 SUPPOSITORY (25 MG TOTAL) RECTALLY EVERY 6 (SIX) HOURS AS NEEDED FOR NAUSEA OR VOMITING. 02/29/20 02/28/21  Ghimire, Werner Lean, MD   propranolol (INDERAL) 20 MG tablet Take 1 tablet (20 mg total) by mouth 2 (two) times daily. For anxiety/HTN 12/23/18   Swayze, Ava, DO  QUEtiapine (SEROQUEL) 100 MG tablet Take 300 mg by mouth at bedtime. 07/05/19   [provider]  QUEtiapine (SEROQUEL) 25 MG tablet Take 25 mg by mouth every morning.    [provider]  senna-docusate (SENOKOT-S) 8.6-50 MG tablet Take 2 tablets by mouth at bedtime. 02/29/20   Ghimire, Werner Lean, MD  senna-docusate (SENOKOT-S) 8.6-50 MG tablet TAKE 2 TABLETS BY MOUTH AT BEDTIME. 02/29/20 02/28/21  Ghimire, Werner Lean, MD  sucralfate (CARAFATE) 1 GM/10ML suspension Take 1 g by mouth at bedtime as needed (stomach pain). 07/05/19   [provider]  topiramate (TOPAMAX) 25 MG tablet Take 1 tablet (25 mg total) by mouth 2 (two) times daily. For seizure activities 11/11/18   Armandina Stammer I, NP  prochlorperazine (COMPAZINE) 25 MG suppository PLACE 1 SUPPOSITORY (25 MG TOTAL) RECTALLY EVERY TWELVE HOURS AS NEEDED FOR NAUSEA OR VOMITING. 02/29/20 02/29/20  Ghimire, Werner Lean, MD    Allergies    Tomato, Ibuprofen, and Oatmeal  Review of Systems   Review of Systems  All other systems reviewed and are negative.  Physical Exam Updated Vital Signs SpO2 98%   Physical Exam Vitals and nursing note reviewed.  Constitutional:      General: She is not in acute distress.    Appearance: Normal appearance. She is well-developed. She is not toxic-appearing.  HENT:     Head: Normocephalic and atraumatic.  Eyes:     General: Lids are normal.     Conjunctiva/sclera: Conjunctivae normal.     Pupils: Pupils are equal, round, and reactive to light.  Neck:     Thyroid: No thyroid mass.     Trachea: No tracheal deviation.  Cardiovascular:     Rate and Rhythm: Normal rate and regular rhythm.     Heart sounds: Normal heart sounds. No murmur heard.   No gallop.  Pulmonary:     Effort: Pulmonary effort is normal. No respiratory distress.     Breath sounds:  Normal breath sounds. No stridor. No decreased breath sounds, wheezing, rhonchi or rales.  Abdominal:  General: There is no distension.     Palpations: Abdomen is soft.     Tenderness: There is no abdominal tenderness. There is no rebound.  Musculoskeletal:        General: No tenderness. Normal range of motion.     Cervical back: Normal range of motion and neck supple.  Skin:    General: Skin is warm and dry.     Findings: No abrasion or rash.  Neurological:     General: No focal deficit present.     Mental Status: She is alert and oriented to person, place, and time. Mental status is at baseline.     GCS: GCS eye subscore is 4. GCS verbal subscore is 5. GCS motor subscore is 6.     Cranial Nerves: Cranial nerves are intact. No cranial nerve deficit.     Sensory: No sensory deficit.     Motor: Motor function is intact.  Psychiatric:        Attention and Perception: Attention normal.        Speech: Speech normal.        Behavior: Behavior normal.    ED Results / Procedures / Treatments   Labs (all labs ordered are listed, but only abnormal results are displayed) Labs Reviewed  RAPID URINE DRUG SCREEN, HOSP PERFORMED  CBC WITH DIFFERENTIAL/PLATELET  COMPREHENSIVE METABOLIC PANEL  LIPASE, BLOOD  URINALYSIS, ROUTINE W REFLEX MICROSCOPIC  I-STAT BETA HCG BLOOD, ED ([facility], [facility], [facility] ONLY)    EKG None  Radiology No results found.  Procedures Procedures   Medications Ordered in ED Medications  lactated ringers bolus 1,000 mL (has no administration in time range)  lactated ringers infusion (has no administration in time range)  fentaNYL (SUBLIMAZE) injection 50 mcg (has no administration in time range)  metoCLOPramide (REGLAN) injection 5 mg (has no administration in time range)    ED Course  I have reviewed the triage vital signs and the nursing notes.  Pertinent labs & imaging results that were available during my care of the patient were reviewed by me and considered in  my medical decision making (see chart for details).    MDM Rules/Calculators/A&P                         Labs consistent with dehydration.  She has no evidence of acute abdomen. Patient treated with IV fluids and pain medication.  Old chart reviewed extensively and shows that she has pseudoseizures.  Also has chronic abdominal pain. Final Clinical Impression(s) / ED Diagnoses Final diagnoses:  None    Rx / DC Orders ED Discharge Orders     None        Lorre Nick, MD 07/22/20 1334

## 2020-07-22 NOTE — ED Triage Notes (Signed)
Pt BIBA from work with c/o seizures. EMS reports pt has been feeling bad with diffuse abdominal pain and nausea. Reports she had two seizures at work and 1 in route to the ED. Hx of seizures. EMS gave 5 mg midazolam IM, 4 mg Zofran IM and 50 mcg fentanyl IM   BP- 180/100 HR-92 98% room air RR-20 CBG 245

## 2020-08-20 ENCOUNTER — Emergency Department (HOSPITAL_COMMUNITY)
Admission: EM | Admit: 2020-08-20 | Discharge: 2020-08-21 | Disposition: A | Discharge status: Home / Self Care | Payer: Medicaid Other | Attending: Emergency Medicine | Admitting: Emergency Medicine

## 2020-08-20 DIAGNOSIS — G40909 Epilepsy, unspecified, not intractable, without status epilepticus: Secondary | ICD-10-CM | POA: Diagnosis not present

## 2020-08-20 DIAGNOSIS — E119 Type 2 diabetes mellitus without complications: Secondary | ICD-10-CM | POA: Diagnosis not present

## 2020-08-20 DIAGNOSIS — I10 Essential (primary) hypertension: Secondary | ICD-10-CM | POA: Diagnosis not present

## 2020-08-20 DIAGNOSIS — R569 Unspecified convulsions: Secondary | ICD-10-CM | POA: Diagnosis present

## 2020-08-20 DIAGNOSIS — R519 Headache, unspecified: Secondary | ICD-10-CM | POA: Diagnosis not present

## 2020-08-20 DIAGNOSIS — R112 Nausea with vomiting, unspecified: Secondary | ICD-10-CM | POA: Insufficient documentation

## 2020-08-20 DIAGNOSIS — Z79899 Other long term (current) drug therapy: Secondary | ICD-10-CM | POA: Insufficient documentation

## 2020-08-20 DIAGNOSIS — N3000 Acute cystitis without hematuria: Secondary | ICD-10-CM

## 2020-08-20 DIAGNOSIS — Z794 Long term (current) use of insulin: Secondary | ICD-10-CM | POA: Insufficient documentation

## 2020-08-20 DIAGNOSIS — N9489 Other specified conditions associated with female genital organs and menstrual cycle: Secondary | ICD-10-CM | POA: Insufficient documentation

## 2020-08-20 DIAGNOSIS — R1084 Generalized abdominal pain: Secondary | ICD-10-CM | POA: Insufficient documentation

## 2020-08-20 LAB — URINALYSIS, ROUTINE W REFLEX MICROSCOPIC
Bilirubin Urine: NEGATIVE
Glucose, UA: 150 mg/dL — AB
Hgb urine dipstick: NEGATIVE
Ketones, ur: 80 mg/dL — AB
Nitrite: NEGATIVE
Protein, ur: >=300 mg/dL — AB
Specific Gravity, Urine: 1.032 — ABNORMAL HIGH (ref 1.005–1.030)
pH: 6 (ref 5.0–8.0)

## 2020-08-20 LAB — BETA-HYDROXYBUTYRIC ACID: Beta-Hydroxybutyric Acid: 0.73 mmol/L — ABNORMAL HIGH (ref 0.05–0.27)

## 2020-08-20 LAB — CBC WITH DIFFERENTIAL/PLATELET
Abs Immature Granulocytes: 0.04 10*3/uL (ref 0.00–0.07)
Basophils Absolute: 0 10*3/uL (ref 0.0–0.1)
Basophils Relative: 0 %
Eosinophils Absolute: 0.1 10*3/uL (ref 0.0–0.5)
Eosinophils Relative: 1 %
HCT: 37.1 % (ref 36.0–46.0)
Hemoglobin: 11.6 g/dL — ABNORMAL LOW (ref 12.0–15.0)
Immature Granulocytes: 0 %
Lymphocytes Relative: 16 %
Lymphs Abs: 1.7 10*3/uL (ref 0.7–4.0)
MCH: 25.4 pg — ABNORMAL LOW (ref 26.0–34.0)
MCHC: 31.3 g/dL (ref 30.0–36.0)
MCV: 81.2 fL (ref 80.0–100.0)
Monocytes Absolute: 0.5 10*3/uL (ref 0.1–1.0)
Monocytes Relative: 5 %
Neutro Abs: 8.2 10*3/uL — ABNORMAL HIGH (ref 1.7–7.7)
Neutrophils Relative %: 78 %
Platelets: 333 10*3/uL (ref 150–400)
RBC: 4.57 MIL/uL (ref 3.87–5.11)
RDW: 15.4 % (ref 11.5–15.5)
WBC: 10.6 10*3/uL — ABNORMAL HIGH (ref 4.0–10.5)
nRBC: 0 % (ref 0.0–0.2)

## 2020-08-20 LAB — COMPREHENSIVE METABOLIC PANEL
ALT: 15 U/L (ref 0–44)
AST: 21 U/L (ref 15–41)
Albumin: 4.7 g/dL (ref 3.5–5.0)
Alkaline Phosphatase: 80 U/L (ref 38–126)
Anion gap: 10 (ref 5–15)
BUN: 11 mg/dL (ref 6–20)
CO2: 24 mmol/L (ref 22–32)
Calcium: 9.8 mg/dL (ref 8.9–10.3)
Chloride: 104 mmol/L (ref 98–111)
Creatinine, Ser: 0.48 mg/dL (ref 0.44–1.00)
GFR, Estimated: >60 mL/min (ref >60)
Glucose, Bld: 186 mg/dL — ABNORMAL HIGH (ref 70–99)
Potassium: 3.7 mmol/L (ref 3.5–5.1)
Sodium: 138 mmol/L (ref 135–145)
Total Bilirubin: 0.6 mg/dL (ref 0.3–1.2)
Total Protein: 8.4 g/dL — ABNORMAL HIGH (ref 6.5–8.1)

## 2020-08-20 LAB — BLOOD GAS, VENOUS
Acid-Base Excess: 1.6 mmol/L (ref 0.0–2.0)
Bicarbonate: 25.9 mmol/L (ref 20.0–28.0)
O2 Saturation: 72.3 %
Patient temperature: 98.6
pCO2, Ven: 42.1 mmHg — ABNORMAL LOW (ref 44.0–60.0)
pH, Ven: 7.407 (ref 7.250–7.430)
pO2, Ven: 41.3 mmHg (ref 32.0–45.0)

## 2020-08-20 LAB — LACTIC ACID, PLASMA: Lactic Acid, Venous: 1.8 mmol/L (ref 0.5–1.9)

## 2020-08-20 LAB — LIPASE, BLOOD: Lipase: 28 U/L (ref 11–51)

## 2020-08-20 LAB — HCG, SERUM, QUALITATIVE: Preg, Serum: NEGATIVE

## 2020-08-20 MED ORDER — ONDANSETRON HCL 4 MG/2ML IJ SOLN
4.0000 mg | Freq: Once | INTRAMUSCULAR | Status: AC
  Administered 2020-08-20: 4 mg via INTRAVENOUS
  Filled 2020-08-20: qty 2

## 2020-08-20 MED ORDER — KETOROLAC TROMETHAMINE 15 MG/ML IJ SOLN
15.0000 mg | Freq: Once | INTRAMUSCULAR | Status: AC
  Administered 2020-08-20: 15 mg via INTRAVENOUS
  Filled 2020-08-20: qty 1

## 2020-08-20 MED ORDER — DIPHENHYDRAMINE HCL 50 MG/ML IJ SOLN
50.0000 mg | Freq: Once | INTRAMUSCULAR | Status: AC
  Administered 2020-08-20: 50 mg via INTRAVENOUS
  Filled 2020-08-20: qty 1

## 2020-08-20 MED ORDER — MORPHINE SULFATE (PF) 4 MG/ML IV SOLN
4.0000 mg | Freq: Once | INTRAVENOUS | Status: AC
  Administered 2020-08-20: 4 mg via INTRAVENOUS
  Filled 2020-08-20: qty 1

## 2020-08-20 MED ORDER — METOCLOPRAMIDE HCL 5 MG/ML IJ SOLN
10.0000 mg | Freq: Once | INTRAMUSCULAR | Status: AC
  Administered 2020-08-20: 10 mg via INTRAVENOUS
  Filled 2020-08-20: qty 2

## 2020-08-20 MED ORDER — SODIUM CHLORIDE 0.9 % IV BOLUS
1000.0000 mL | Freq: Once | INTRAVENOUS | Status: AC
  Administered 2020-08-20: 1000 mL via INTRAVENOUS

## 2020-08-20 MED ORDER — LEVETIRACETAM IN NACL 1500 MG/100ML IV SOLN
1500.0000 mg | Freq: Once | INTRAVENOUS | Status: AC
  Administered 2020-08-20: 1500 mg via INTRAVENOUS
  Filled 2020-08-20: qty 100

## 2020-08-20 NOTE — ED Triage Notes (Signed)
Ems brings pt in from work for seizures. Witnessed seizure like activity. History of seizures.

## 2020-08-20 NOTE — ED Notes (Signed)
Unable to provide urine specimen. Placed on cardiac monitor.

## 2020-08-20 NOTE — ED Provider Notes (Signed)
11:14 PM Assumed care from Dr. Wallace Cullens, please see their note for full history, physical and decision making until this point. In brief this is a 20 y.o. year old female who presented to the ED tonight with Seizures     Apparently had breakthrough seizure. Now with intractable nausea. Getting meds/CT and likely discharge if ok.   Appears to have UTI. No further seizures. Pain improved. CT ok. D/c on keflex.  Discharge instructions, including strict return precautions for new or worsening symptoms, given. Patient and/or family verbalized understanding and agreement with the plan as described.   Labs, studies and imaging reviewed by myself and considered in medical decision making if ordered. Imaging interpreted by radiology.  Labs Reviewed  CBC WITH DIFFERENTIAL/PLATELET - Abnormal; Notable for the following components:      Result Value   WBC 10.6 (*)    Hemoglobin 11.6 (*)    MCH 25.4 (*)    Neutro Abs 8.2 (*)    All other components within normal limits  COMPREHENSIVE METABOLIC PANEL - Abnormal; Notable for the following components:   Glucose, Bld 186 (*)    Total Protein 8.4 (*)    All other components within normal limits  BLOOD GAS, VENOUS - Abnormal; Notable for the following components:   pCO2, Ven 42.1 (*)    All other components within normal limits  BETA-HYDROXYBUTYRIC ACID - Abnormal; Notable for the following components:   Beta-Hydroxybutyric Acid 0.73 (*)    All other components within normal limits  LIPASE, BLOOD  HCG, SERUM, QUALITATIVE  LACTIC ACID, PLASMA  URINALYSIS, ROUTINE W REFLEX MICROSCOPIC  LAMOTRIGINE LEVEL    CT ABDOMEN PELVIS W CONTRAST    (Results Pending)    No follow-ups on file.    Demitrios Molyneux, Barbara Cower, MD 08/21/20 [phone]

## 2020-08-20 NOTE — ED Provider Notes (Signed)
[facility]-EMERGENCY DEPT Provider Note   CSN: [id number] Arrival date & time: 08/20/20  1450     History Chief Complaint  Patient presents with   Seizures    Nancy [name] is a 20 y.o. female.  Patient is a 20 yo female with pmh of seizure disorder and DM presenting via EMS for seizure. Patient states she had over six seizures back to back while at work. Admits to falling with blunt head trauma, denies blood thinner use, denies any sensory/motor deficits at this time. Denies spinal pain.  Patient states she sees [facility] and has frequent break through seizures. Compliant with Lamotrigine. Patient states prior to seizures she developed severe generalized abdominal pain with nausea and vomiting this morning. Hx of DM on insulin. Admits to previous hx of DKA.   The history is provided by the patient. No language interpreter was used.  Seizures Seizure activity on arrival: no   Episode characteristics: abnormal movements   Postictal symptoms comment:  Known Return to baseline: yes       Past Medical History:  Diagnosis Date   Acanthosis nigricans    Anxiety    CHF (congestive heart failure) (HCC)    Chronic lower back pain    Depression    DKA, type 1 (HCC) 09/13/2018   Dyspepsia    Obesity    Ovarian cyst    pt is not aware of this hx (11/24/2017)   Precocious adrenarche (HCC)    Premature baby    Seizures (HCC)    Type II diabetes mellitus (HCC)    insulin dependant    Patient Active Problem List   Diagnosis Date Noted   Abdominal pain 02/22/2020   Chronic constipation 02/22/2020   Nausea & vomiting 02/22/2020   SIRS (systemic inflammatory response syndrome) (HCC) 01/14/2020   Diabetic gastroparesis (HCC) 11/25/2019   Polysubstance abuse (HCC) 11/25/2019   Gastritis 11/25/2019   DKA, type 1 (HCC) 11/24/2019   Cyclical vomiting with nausea 10/01/2019   Intractable nausea and vomiting 09/30/2019   Cellulitis    DKA (diabetic  ketoacidoses) 01/27/2019   Leukocytosis 12/19/2018   Dehydration    Pseudoseizures (HCC)    Type 1 diabetes mellitus with ketoacidosis without coma (HCC) 11/28/2018   Hyperglycemia 11/27/2018   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   MDD (major depressive disorder), recurrent episode, severe (HCC) 11/06/2018   Nonspecific abnormal electrocardiogram (ECG) (EKG)    Chest pain of uncertain etiology    Hypertensive urgency 10/08/2018   Conversion disorder with attacks or seizures, acute episode, with psychological stressor 09/16/2018   MDD (major depressive disorder), recurrent severe, without psychosis (HCC)    AKI (acute kidney injury) (HCC) 08/04/2018   Seizure-like activity (HCC) 08/03/2018   Depression 07/25/2018   Syncope 01/30/2018   Orthostatic hypotension 01/24/2018   Tachycardia 12/28/2017   Chronic abdominal pain 12/24/2017   Chest pain 12/19/2017   Nausea and vomiting 08/21/2017   Generalized abdominal pain 08/21/2017   Non compliance with medical treatment 01/27/2012   Adjustment disorder 09/16/2011   Acanthosis nigricans    Goiter    Obesity 06/14/2010   Hypertension 06/14/2010    Past Surgical History:  Procedure Laterality Date   ABDOMINAL HERNIA REPAIR     "I was a baby"   BIOPSY  10/12/2018   Procedure: BIOPSY;  Surgeon: Lynann Bologna, MD;  Location: [facility] ENDOSCOPY;  Service: Endoscopy;;   BIOPSY  02/28/2020   Procedure: BIOPSY;  Surgeon: Barron Alvine,  Verlin Dike, DO;  Location: [facility] ENDOSCOPY;  Service: Gastroenterology;;   ESOPHAGOGASTRODUODENOSCOPY (EGD) WITH PROPOFOL N/A 10/12/2018   Procedure: ESOPHAGOGASTRODUODENOSCOPY (EGD) WITH PROPOFOL;  Surgeon: Lynann Bologna, MD;  Location[facility] ENDOSCOPY;  Service: Endoscopy;  Laterality: N/A;   ESOPHAGOGASTRODUODENOSCOPY (EGD) WITH PROPOFOL N/A 02/28/2020   Procedure: ESOPHAGOGASTRODUODENOSCOPY (EGD) WITH PROPOFOL;  Surgeon: Shellia Cleverly, DO;  Location: [facility] ENDOSCOPY;  Service:  Gastroenterology;  Laterality: N/A;   FLEXIBLE SIGMOIDOSCOPY N/A 02/28/2020   Procedure: FLEXIBLE SIGMOIDOSCOPY;  Surgeon: Shellia Cleverly, DO;  Location: [facility] ENDOSCOPY;  Service: Gastroenterology;  Laterality: N/A;   HERNIA REPAIR     LEFT HEART CATH AND CORONARY ANGIOGRAPHY N/A 10/13/2018   Procedure: LEFT HEART CATH AND CORONARY ANGIOGRAPHY;  Surgeon: Kathleene Hazel, MD;  Location: [facility] INVASIVE CV LAB;  Service: Cardiovascular;  Laterality: N/A;   TONSILLECTOMY AND ADENOIDECTOMY     WISDOM TOOTH EXTRACTION  2017     OB History   No obstetric history on file.     Family History  Problem Relation Age of Onset   Diabetes Mother    Hypertension Mother    Obesity Mother    Asthma Mother    Allergic rhinitis Mother    Eczema Mother    Cervical cancer Mother    Diabetes Father    Hypertension Father    Obesity Father    Hyperlipidemia Father    Hypertension Paternal Aunt    Hypertension Maternal Grandfather    Colon cancer Maternal Grandfather    Diabetes Paternal Grandmother    Obesity Paternal Grandmother    Diabetes Paternal Grandfather    Obesity Paternal Grandfather    Angioedema Neg Hx    Immunodeficiency Neg Hx    Urticaria Neg Hx    Stomach cancer Neg Hx    Esophageal cancer Neg Hx     Social History   Tobacco Use   Smoking status: Never   Smokeless tobacco: Never  Vaping Use   Vaping Use: Never used  Substance Use Topics   Alcohol use: No    Alcohol/week: 0.0 standard drinks   Drug use: No    Home Medications Prior to Admission medications   Medication Sig Start Date End Date Taking? Authorizing Provider  ARIPiprazole (ABILIFY) 10 MG tablet Take 10 mg by mouth in the morning and at bedtime.     [provider]  atorvastatin (LIPITOR) 20 MG tablet Take 1 tablet (20 mg total) by mouth daily at 6 PM. Patient taking differently: Take 20 mg by mouth daily. 10/05/18   Storm Frisk, MD  capsaicin (ZOSTRIX) 0.025 % cream Apply topically 2  (two) times daily. 02/29/20   Ghimire, Werner Lean, MD  capsaicin (ZOSTRIX) 0.025 % cream APPLY TOPICALLY TWO TIMES DAILY. Patient not taking: Reported on 07/22/2020 02/29/20 02/28/21  Maretta Bees, MD  diclofenac Sodium (VOLTAREN) 1 % GEL Apply 1 application topically 2 (two) times daily as needed (chest pains).    [provider]  insulin aspart (NOVOLOG FLEXPEN) 100 UNIT/ML FlexPen Inject 15 Units into the skin 2 (two) times daily before a meal.    [provider]  insulin glargine (LANTUS) 100 UNIT/ML Solostar Pen INJECT 30 UNITS INTO THE SKIN AT BEDTIME Patient taking differently: Inject 60 Units into the skin at bedtime. 02/29/20 02/28/21  Ghimire, Werner Lean, MD  insulin glargine (LANTUS) 100 unit/mL SOPN Inject 30 Units into the skin at bedtime. Patient not taking: Reported on 07/22/2020 02/29/20   Maretta Bees, MD  lamoTRIgine (  LAMICTAL) 25 MG tablet Take 2 tablets (50 mg total) by mouth 2 (two) times daily. For mood stabilization 11/11/18   Armandina Stammer I, NP  lubiprostone (AMITIZA) 24 MCG capsule Take 1 capsule (24 mcg total) by mouth 2 (two) times daily with a meal. Patient taking differently: Take 24 mcg by mouth daily with breakfast. 10/27/18   Esterwood, Amy S, PA-C  metoCLOPramide (REGLAN) 5 MG tablet Take 1 tablet (5 mg total) by mouth 3 (three) times daily. Patient taking differently: Take 5 mg by mouth as needed for nausea. 10/03/19   Jerald Kief, MD  mirtazapine (REMERON) 7.5 MG tablet Take 1 tablet (7.5 mg total) by mouth at bedtime. For depression/sleep 11/11/18   Armandina Stammer I, NP  Multiple Vitamin (MULTIVITAMIN WITH MINERALS) TABS tablet Take 1 tablet by mouth daily. 09/18/18   Clapacs, Jackquline Denmark, MD  pantoprazole (PROTONIX) 40 MG tablet Take 1 tablet (40 mg total) by mouth 2 (two) times daily. For acid reflux 01/29/19   Jae Dire, MD  penicillin v potassium (VEETID) 500 MG tablet Take 500 mg by mouth every 6 (six) hours. 07/10/20   [provider]   polyethylene glycol (MIRALAX / GLYCOLAX) 17 g packet Take 17 g by mouth 2 (two) times daily. Patient taking differently: Take 17 g by mouth daily as needed for mild constipation. 01/16/20   Swayze, Ava, DO  promethazine (PHENERGAN) 25 MG suppository PLACE 1 SUPPOSITORY (25 MG TOTAL) RECTALLY EVERY 6 (SIX) HOURS AS NEEDED FOR NAUSEA OR VOMITING. Patient taking differently: Place 25 mg rectally every 6 (six) hours as needed for nausea or vomiting. 02/29/20 02/28/21  Ghimire, Werner Lean, MD  propranolol (INDERAL) 20 MG tablet Take 1 tablet (20 mg total) by mouth 2 (two) times daily. For anxiety/HTN Patient taking differently: Take 20 mg by mouth daily. For anxiety/HTN 12/23/18   Swayze, Ava, DO  QUEtiapine (SEROQUEL) 100 MG tablet Take 300 mg by mouth at bedtime. 07/05/19   [provider]  QUEtiapine (SEROQUEL) 25 MG tablet Take 50 mg by mouth every morning.    [provider]  senna-docusate (SENOKOT-S) 8.6-50 MG tablet Take 2 tablets by mouth at bedtime. Patient not taking: Reported on 07/22/2020 02/29/20   Maretta Bees, MD  senna-docusate (SENOKOT-S) 8.6-50 MG tablet TAKE 2 TABLETS BY MOUTH AT BEDTIME. Patient not taking: Reported on 07/22/2020 02/29/20 02/28/21  Maretta Bees, MD  topiramate (TOPAMAX) 25 MG tablet Take 1 tablet (25 mg total) by mouth 2 (two) times daily. For seizure activities 11/11/18   Armandina Stammer I, NP  prochlorperazine (COMPAZINE) 25 MG suppository PLACE 1 SUPPOSITORY (25 MG TOTAL) RECTALLY EVERY TWELVE HOURS AS NEEDED FOR NAUSEA OR VOMITING. 02/29/20 02/29/20  Maretta Bees, MD    Allergies    Tomato, Ibuprofen, and Oatmeal  Review of Systems   Review of Systems  Constitutional:  Negative for chills and fever.  HENT:  Negative for ear pain and sore throat.   Eyes:  Negative for pain and visual disturbance.  Respiratory:  Negative for cough and shortness of breath.   Cardiovascular:  Negative for chest pain and palpitations.  Gastrointestinal:   Positive for abdominal pain, nausea and vomiting.  Genitourinary:  Negative for dysuria and hematuria.  Musculoskeletal:  Negative for arthralgias and back pain.  Skin:  Negative for color change and rash.  Neurological:  Positive for seizures. Negative for syncope.  All other systems reviewed and are negative.  Physical Exam Updated Vital Signs SpO2 100%  Physical Exam Vitals and nursing note reviewed.  Constitutional:      General: She is not in acute distress.    Appearance: She is well-developed.  HENT:     Head: Normocephalic and atraumatic.  Eyes:     Conjunctiva/sclera: Conjunctivae normal.  Cardiovascular:     Rate and Rhythm: Normal rate and regular rhythm.     Heart sounds: No murmur heard. Pulmonary:     Effort: Pulmonary effort is normal. No respiratory distress.     Breath sounds: Normal breath sounds.  Abdominal:     Palpations: Abdomen is soft.     Tenderness: There is generalized abdominal tenderness. There is no guarding or rebound.     Comments: Actively vomiting  Musculoskeletal:     Cervical back: Neck supple.  Skin:    General: Skin is warm and dry.  Neurological:     Mental Status: She is alert.     GCS: GCS eye subscore is 4. GCS verbal subscore is 5. GCS motor subscore is 6.     Cranial Nerves: Cranial nerves are intact.     Sensory: Sensation is intact.     Motor: Motor function is intact.     Coordination: Coordination is intact.    ED Results / Procedures / Treatments   Labs (all labs ordered are listed, but only abnormal results are displayed) Labs Reviewed - No data to display  EKG None  Radiology No results found.  Procedures Procedures   Medications Ordered in ED Medications - No data to display  ED Course  I have reviewed the triage vital signs and the nursing notes.  Pertinent labs & imaging results that were available during my care of the patient were reviewed by me and considered in my medical decision making (see  chart for details).    MDM Rules/Calculators/A&P                          3:46 PM 20 yo female with pmh of seizure disorder and DM presenting via EMS for nausea, vomiting, generalized abdominal pain, and and break through seizures. Patient is Aox3, no acute distress, no seizure like active, non postictal-back to baseline, with stable vitals. Paitent has soft abdomen with generalized abdominal pain to palpation  -Pt states she is compliant on home lamotrigine. Lamotrigine level sent. States she had 6 back to back seizures PTA. Loaded with keppra in ED with recommendations for close follow up with her neurologist for management of break through seizures. .   -Reglan, benadryl, iv fluids, and Toradol given for symptomatic management of n/v/abdominal pain. Laboratory studies demonstrates no signs of DKA. No sings/symtpoms of sepsis. Negative serum pregnancy. UA pending. Stable liver profile, lipase, and renal function. On re-evaluation patient admits to continued abdominal pain. CT abdomen pending.  11:05 PM Patient signed out to oncoming physician while awaiting CT abdomen.         Final Clinical Impression(s) / ED Diagnoses Final diagnoses:  Breakthrough Seizure (HCC)  Seizure disorder (HCC)  Generalized abdominal pain  Nausea and vomiting, intractability of vomiting not specified, unspecified vomiting type    Rx / DC Orders ED Discharge Orders     None        Franne Forts, DO 08/20/20 2307

## 2020-08-21 ENCOUNTER — Emergency Department (HOSPITAL_COMMUNITY): Payer: Medicaid Other

## 2020-08-21 MED ORDER — ONDANSETRON 4 MG PO TBDP: ORAL_TABLET | ORAL | 0 refills | Status: DC

## 2020-08-21 MED ORDER — CEPHALEXIN 500 MG PO CAPS: 500.0000 mg | ORAL_CAPSULE | Freq: Four times a day (QID) | ORAL | 0 refills | Status: DC

## 2020-08-21 MED ORDER — IOHEXOL 350 MG/ML SOLN
80.0000 mL | Freq: Once | INTRAVENOUS | Status: AC | PRN
  Administered 2020-08-21: 80 mL via INTRAVENOUS

## 2020-08-21 MED ORDER — SODIUM CHLORIDE 0.9 % IV SOLN
2.0000 g | Freq: Once | INTRAVENOUS | Status: AC
  Administered 2020-08-21: 2 g via INTRAVENOUS
  Filled 2020-08-21: qty 20

## 2020-08-21 MED ORDER — PHENAZOPYRIDINE HCL 200 MG PO TABS: 200.0000 mg | ORAL_TABLET | Freq: Three times a day (TID) | ORAL | 0 refills | Status: DC | PRN

## 2020-08-22 IMAGING — DX DG ABDOMEN 1V
2 series · 2 of 2 positions shown · non-contrast
Comparison: CT 02/26/2019.

CLINICAL DATA: Abdominal pain.  Nausea vomiting.  Diarrhea.

EXAM:
ABDOMEN - 1 VIEW

[abdomen kub (1 of 2)]
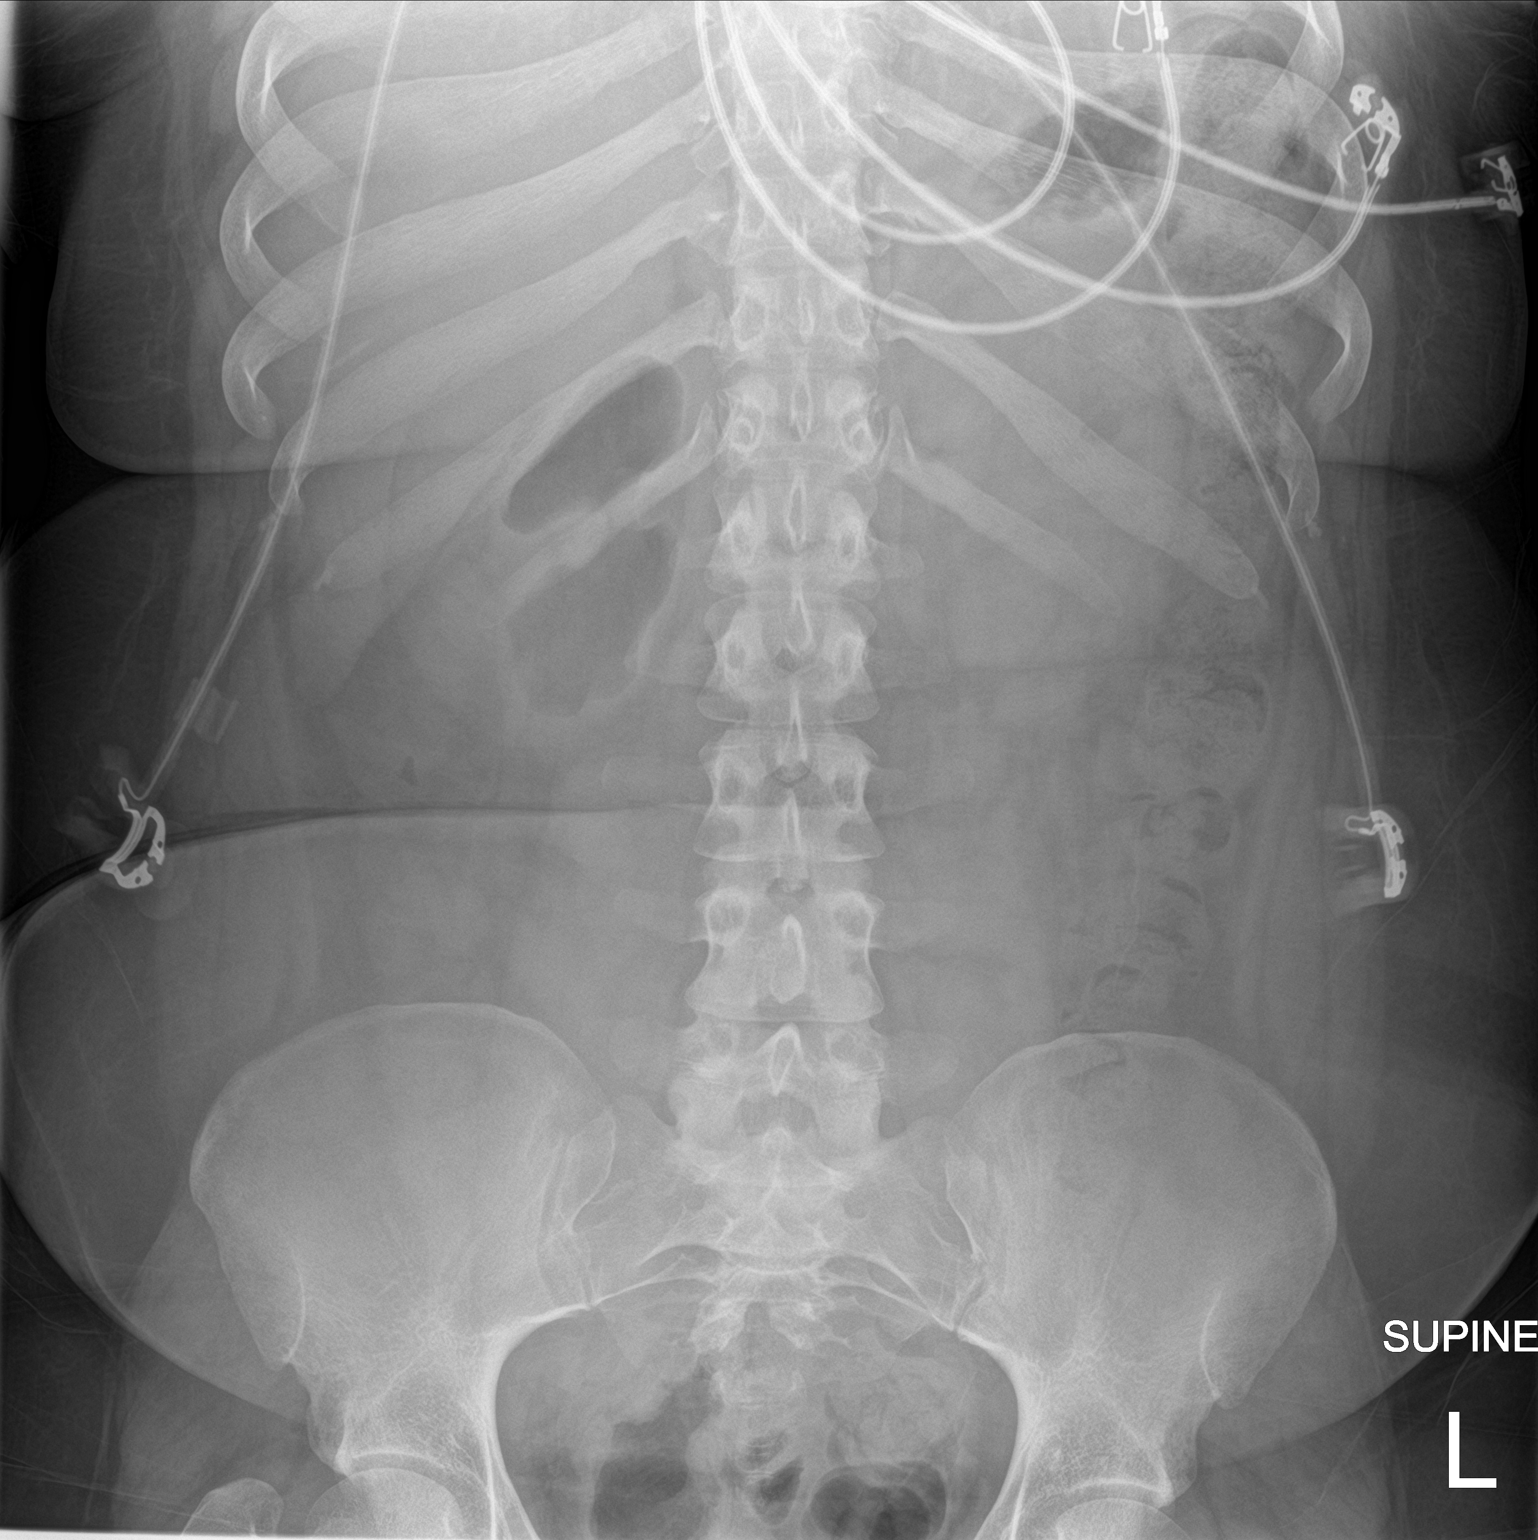

[abdomen kub (2 of 2)]
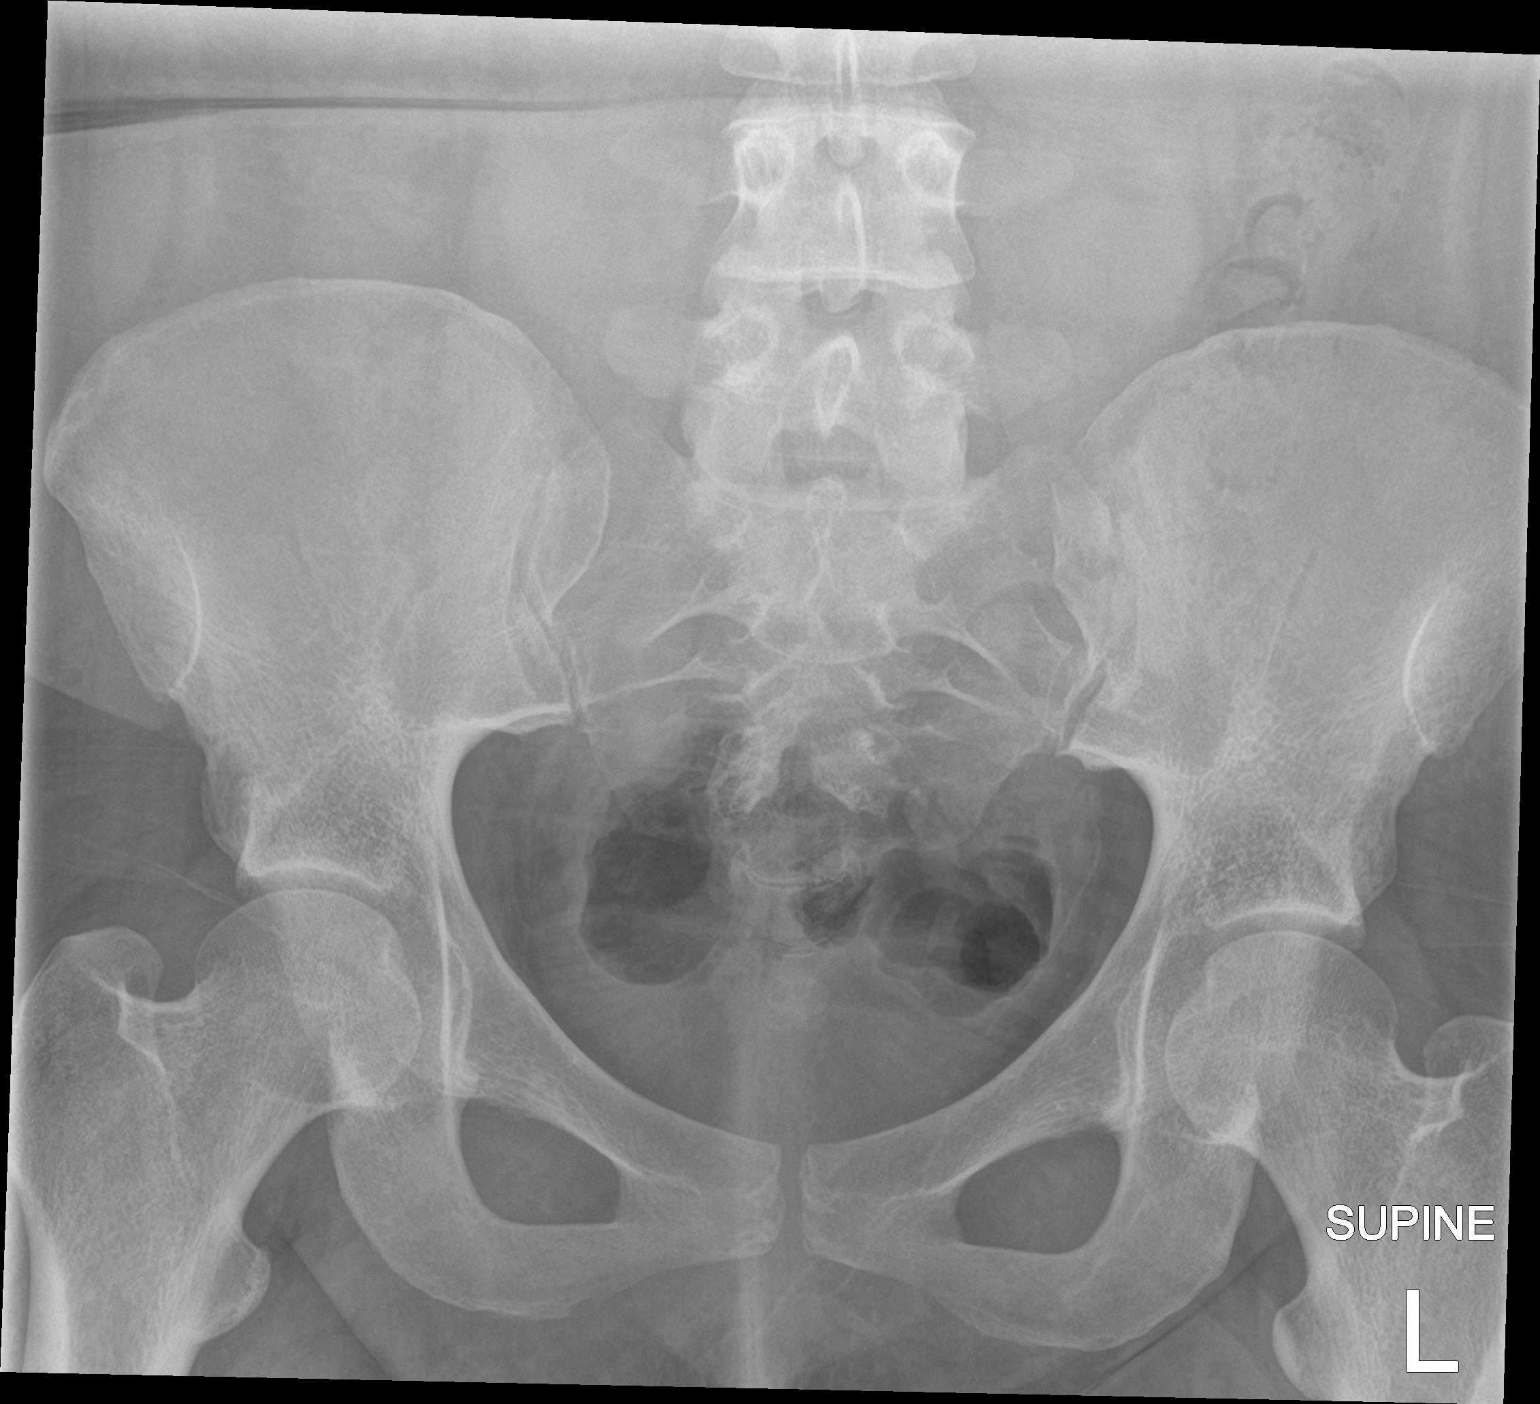

[2 of 2 positions shown; findings below may reference images not displayed]

FINDINGS: Soft tissue structures are unremarkable. No bowel distention. Stool
noted throughout the colon. No free air identified. Hemidiaphragms
not imaged. No acute bony abnormality identified.
IMPRESSION: No acute abnormality identified. No bowel distention noted. Stool
noted throughout colon.

## 2020-08-22 IMAGING — DX DG CHEST 1V PORT
1 series · 1 of 1 positions shown · non-contrast
Comparison: 02/26/2019.

CLINICAL DATA: Hematemesis.

EXAM:
PORTABLE CHEST 1 VIEW

[chest ap]
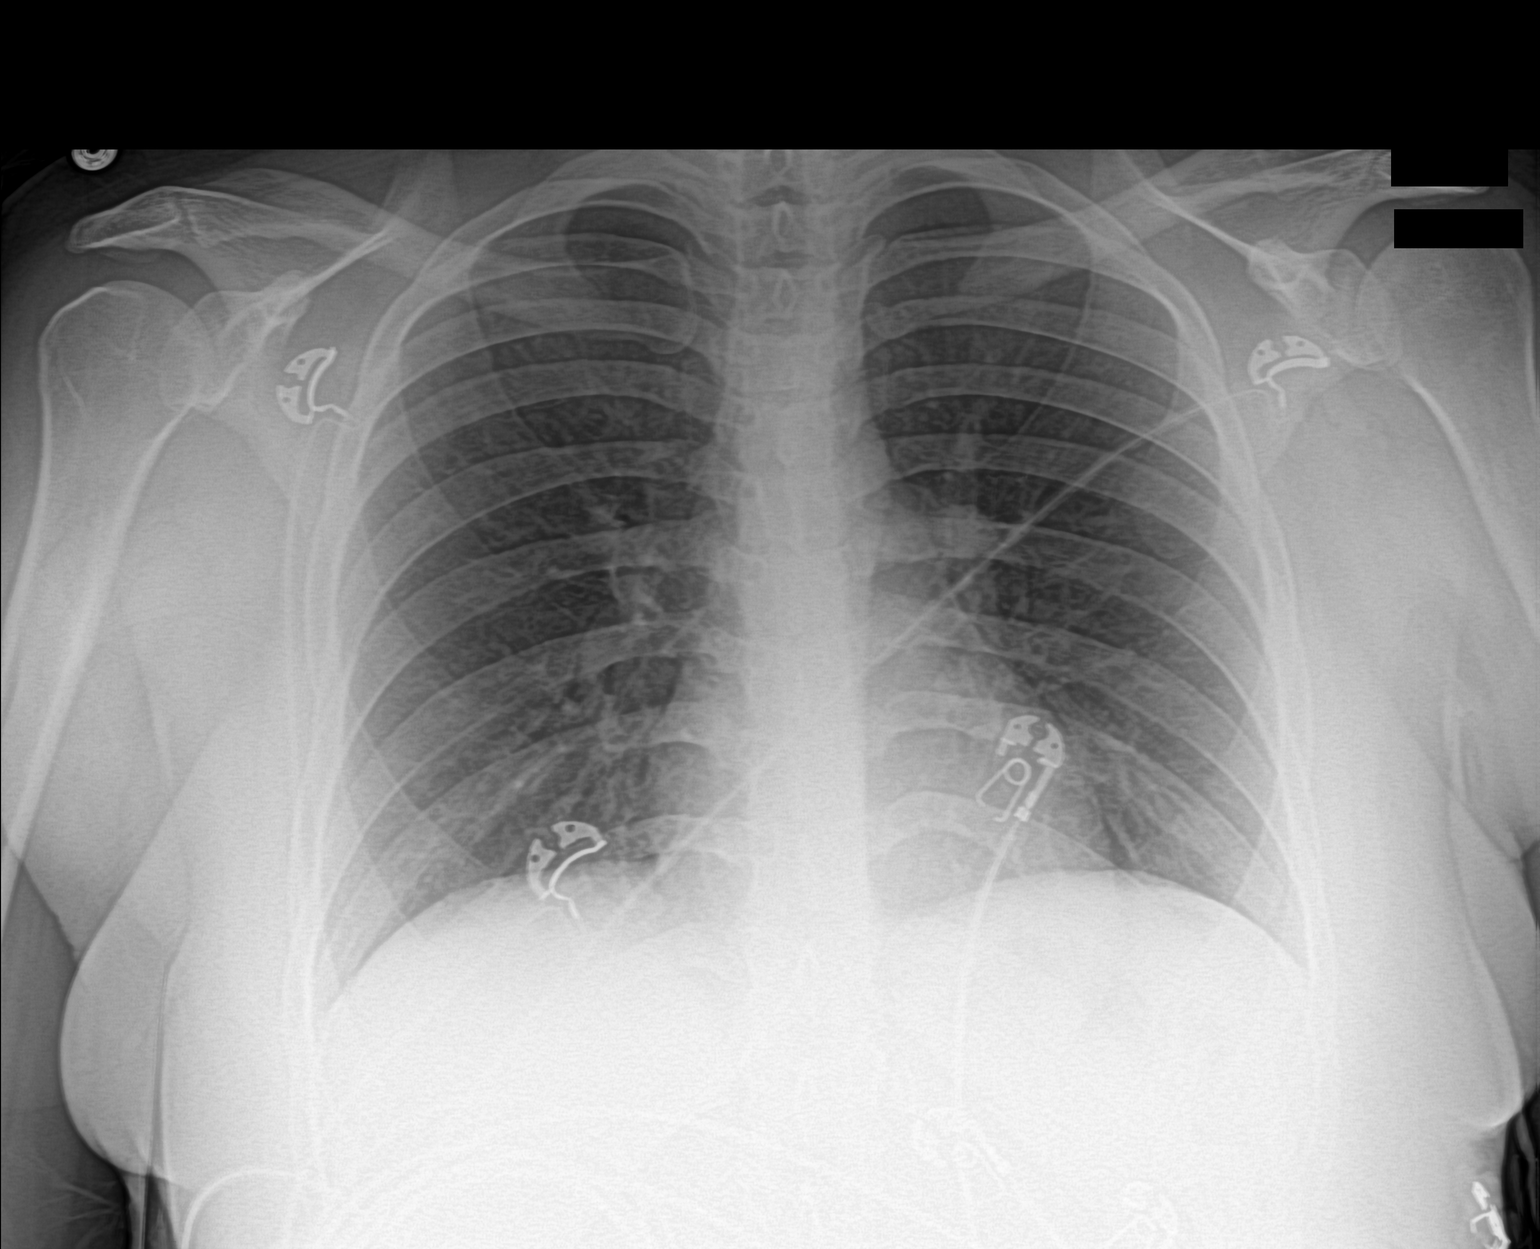

[1 of 1 positions shown; findings below may reference images not displayed]

FINDINGS: Mediastinum hilar structures normal. Low lung volumes. No focal
infiltrate. No pleural effusion or pneumothorax. Heart size normal.
No acute bony abnormality.
IMPRESSION: Low lung volumes.  No acute cardiopulmonary disease identified.

## 2020-08-23 LAB — LAMOTRIGINE LEVEL: Lamotrigine Lvl: <1 ug/mL — ABNORMAL LOW (ref 2.0–20.0)

## 2020-08-23 IMAGING — CR DG ABDOMEN ACUTE W/ 1V CHEST
4 series · 4 of 4 positions shown · non-contrast
Comparison: August 23, 2019

CLINICAL DATA: Generalized abdominal pain with nausea and vomiting.

EXAM:
DG ABDOMEN ACUTE W/ 1V CHEST

[chest pa]
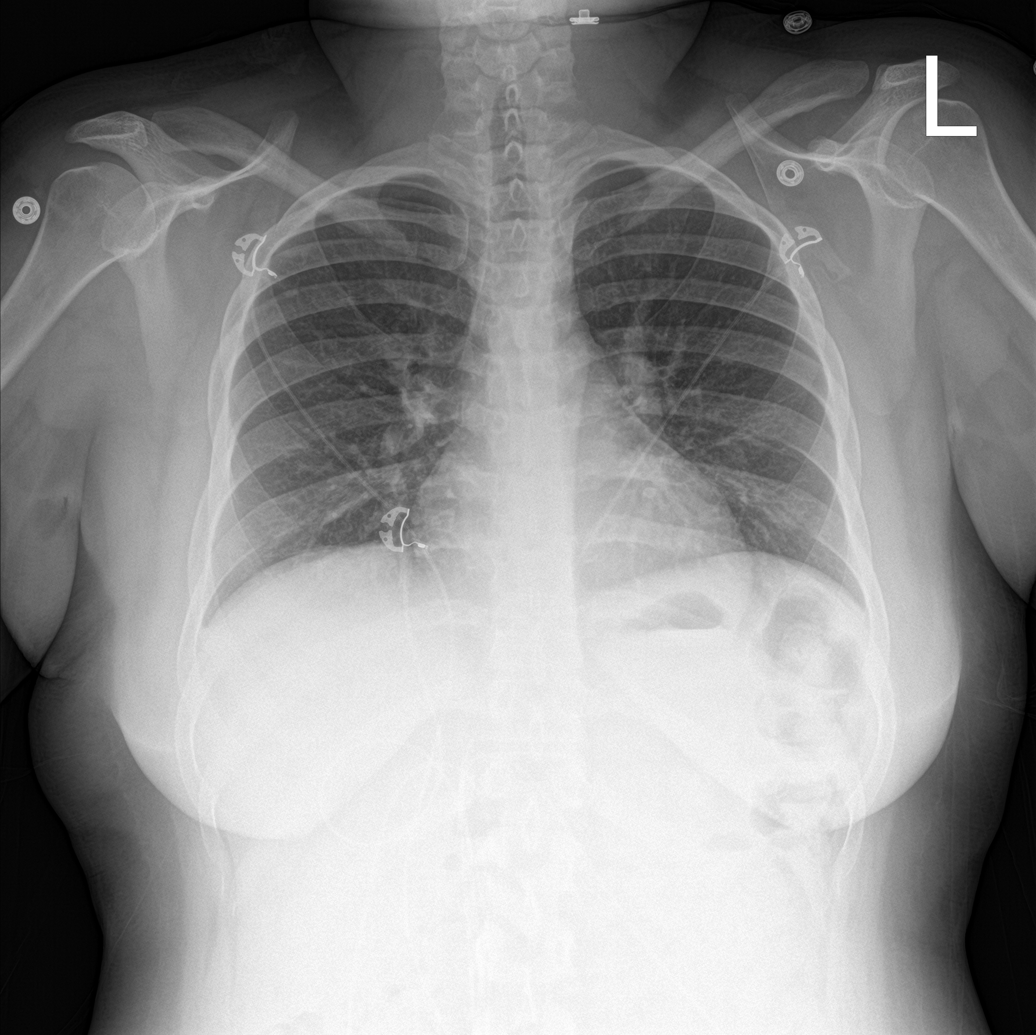

[abdomen erect]
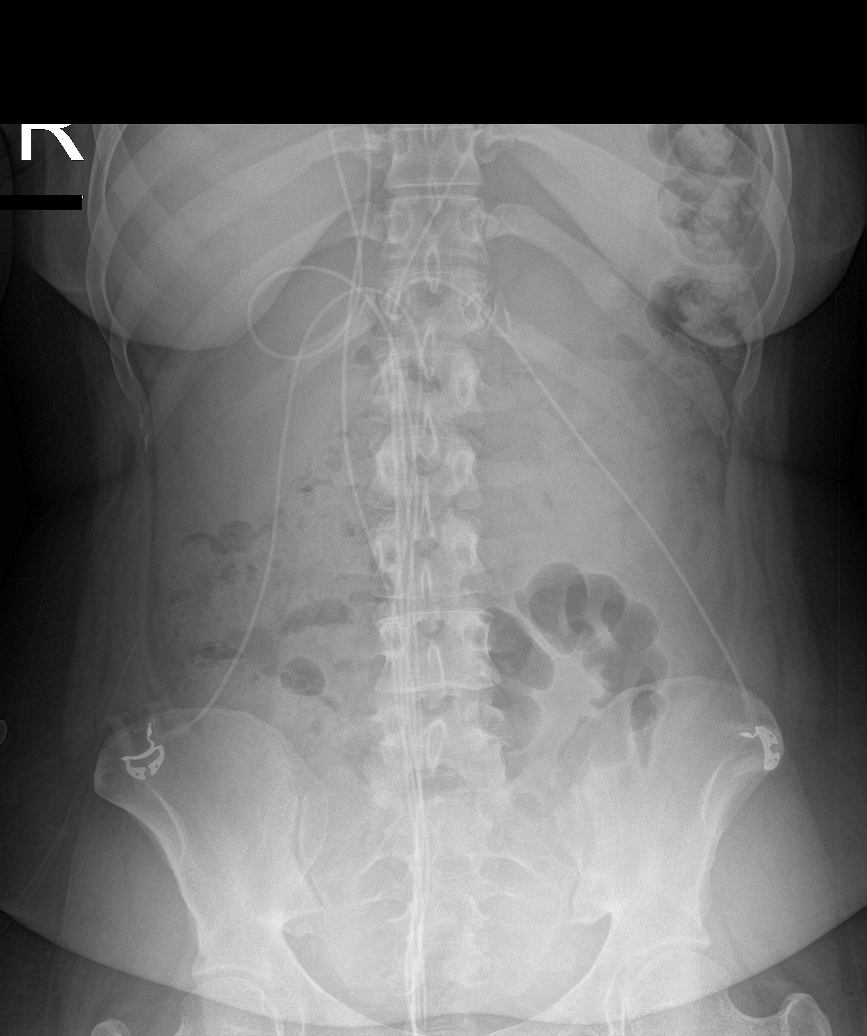

[abdomen supine (1 of 2)]
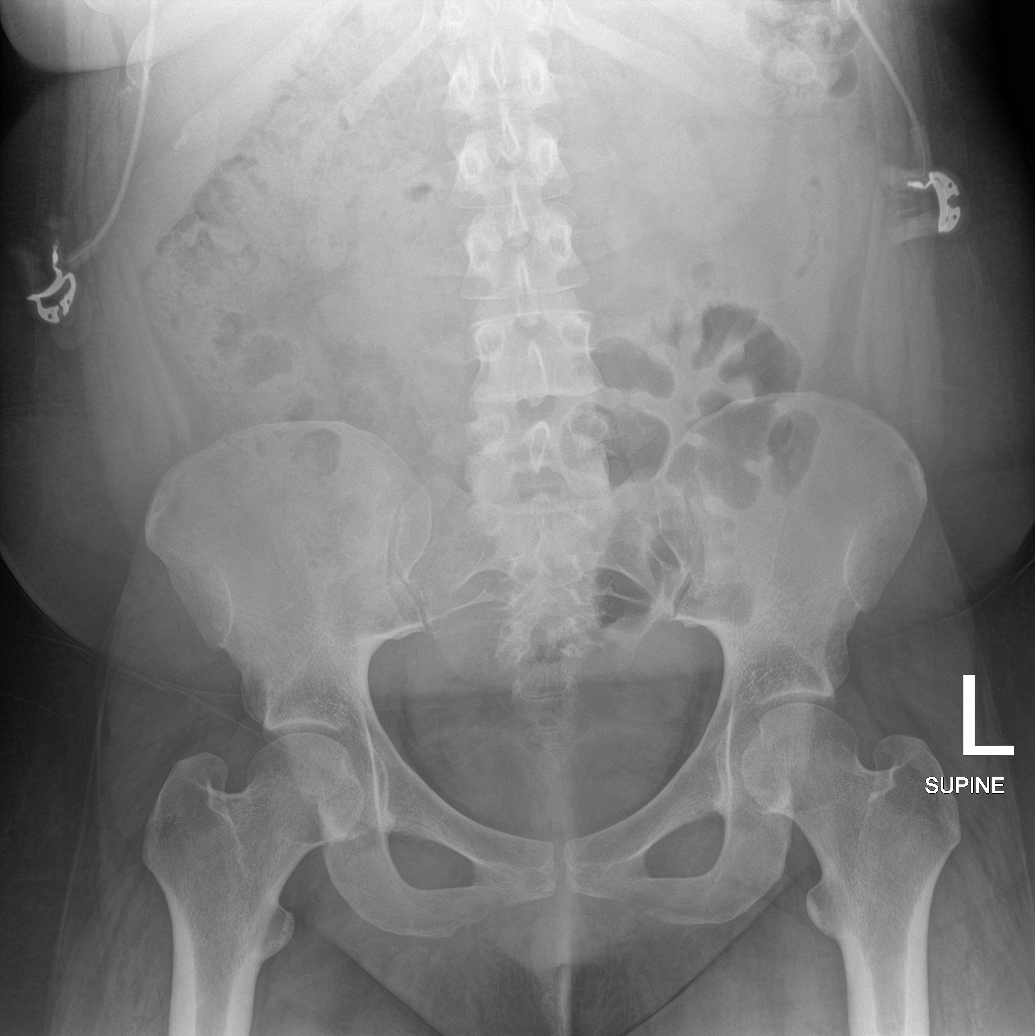

[abdomen supine (2 of 2)]
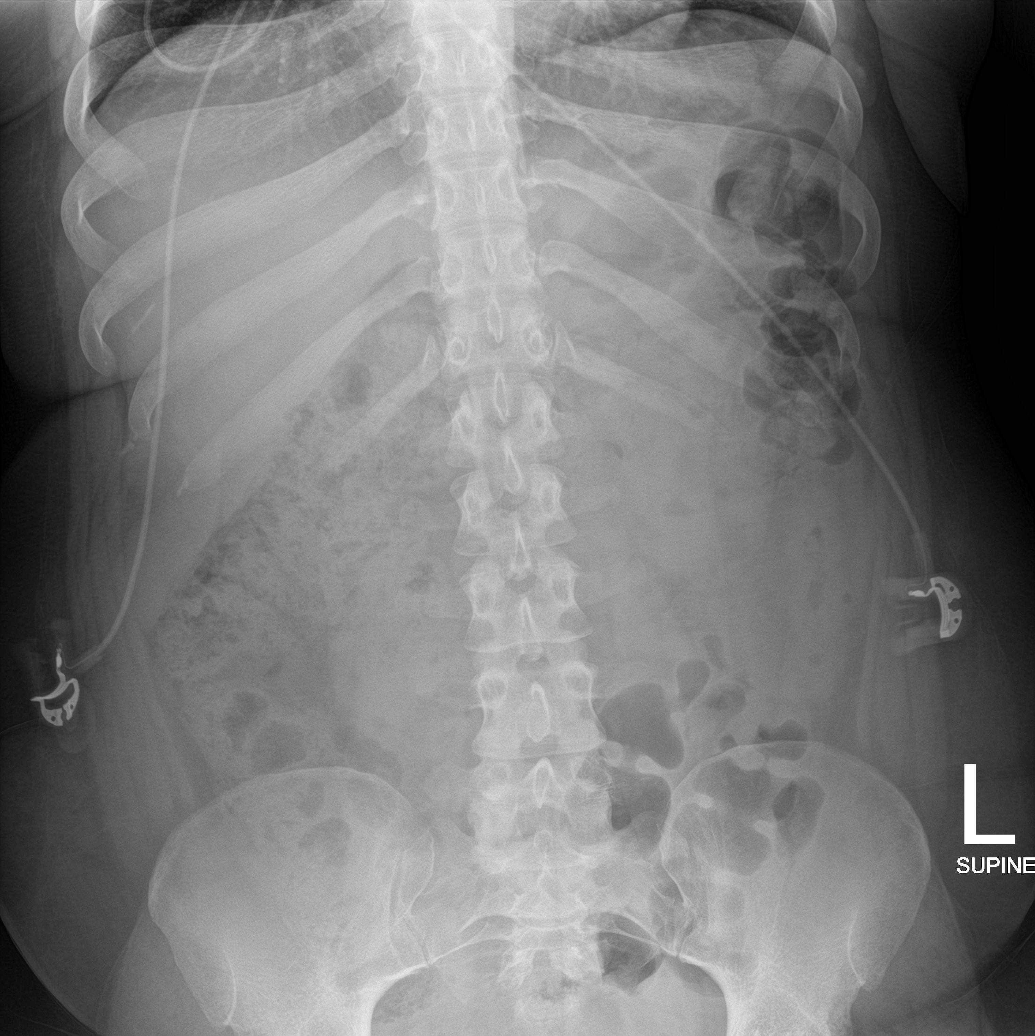

[4 of 4 positions shown; findings below may reference images not displayed]

FINDINGS: There is no evidence of dilated bowel loops or free intraperitoneal
air. A large amount of stool is seen throughout the colon. No
radiopaque calculi or other significant radiographic abnormality is
seen. Heart size and mediastinal contours are within normal limits.
Both lungs are clear.
IMPRESSION: Negative abdominal radiographs.  No acute cardiopulmonary disease.

## 2020-08-25 IMAGING — CT CT ABD-PELV W/O CM
2 of 4 series · 16 of 46 positions shown, 18 images · non-contrast
Comparison: 08/24/2019, 02/26/2019

CLINICAL DATA: Diffuse abdominal pain, nausea, vomiting, diarrhea

EXAM:
CT ABDOMEN AND PELVIS WITHOUT CONTRAST
TECHNIQUE: Multidetector CT imaging of the abdomen and pelvis was performed
following the standard protocol without IV contrast.

[Series 3: abd/ pelvis 5.0 i30f 2 · axial · 0.78mm/px · z∈[+662,+1087]mm · 13 of 93 slices shown, 15 images]
[im 4/93  soft-tissue]
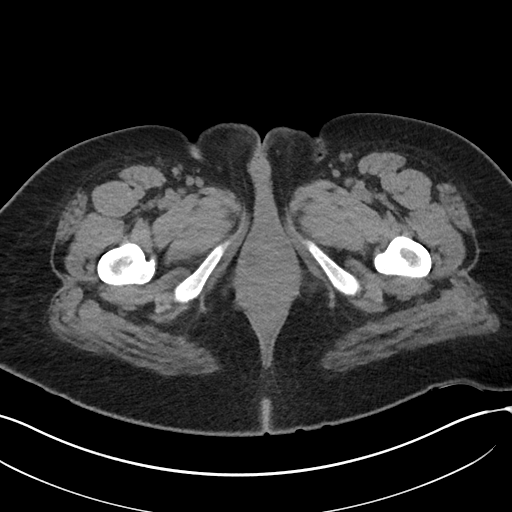
[im 4/93  bone]
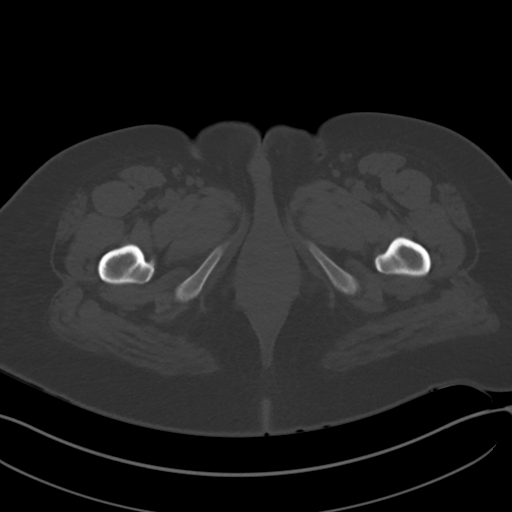
[im 12/93  soft-tissue]
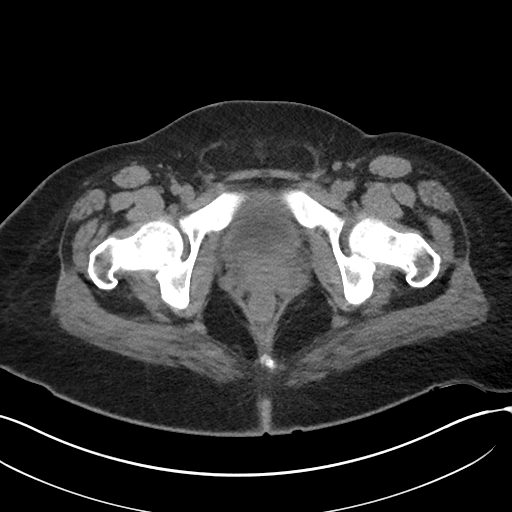
[im 20/93  soft-tissue]
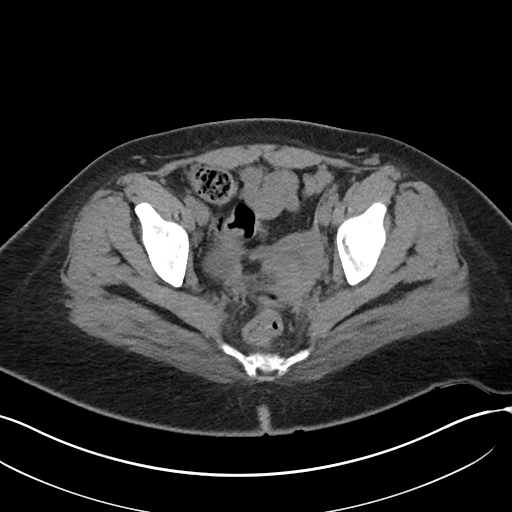
[im 27/93  soft-tissue]
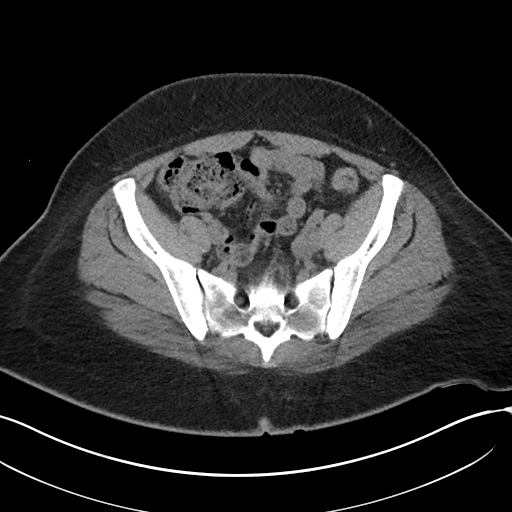
[im 31/93  soft-tissue]
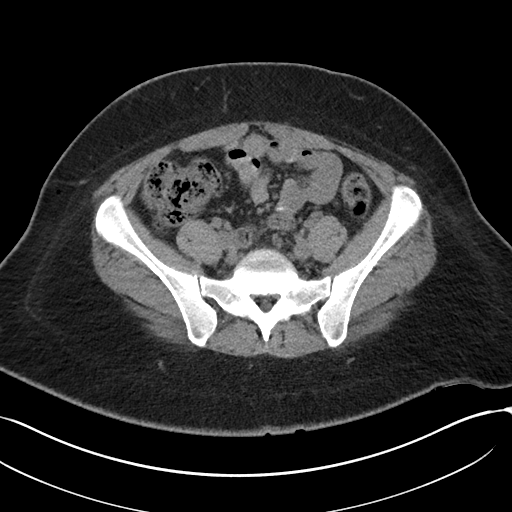
[im 39/93  soft-tissue]
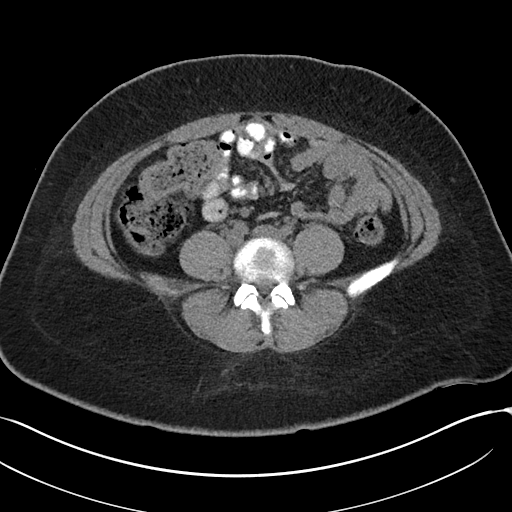
[im 47/93  soft-tissue]
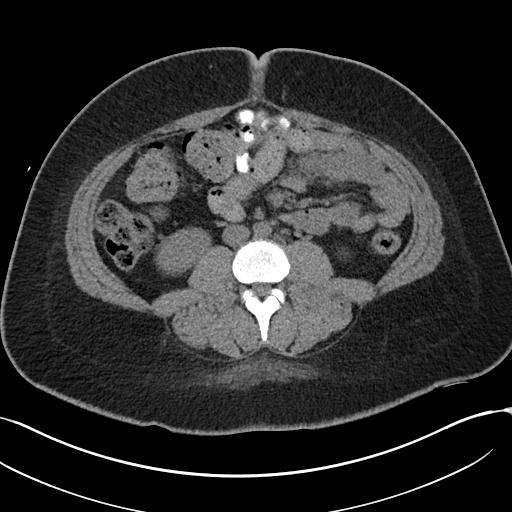
[im 54/93  soft-tissue]
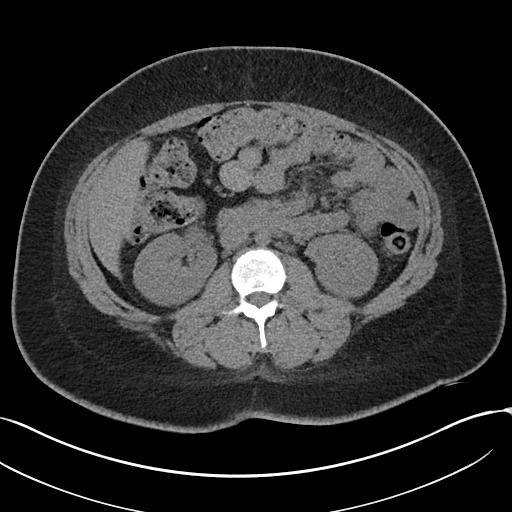
[im 62/93  soft-tissue]
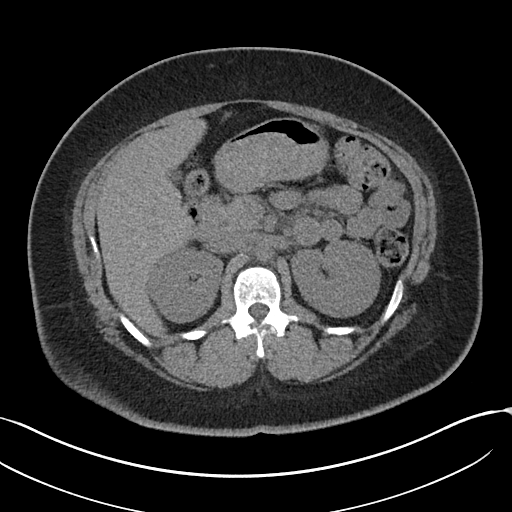
[im 62/93  bone]
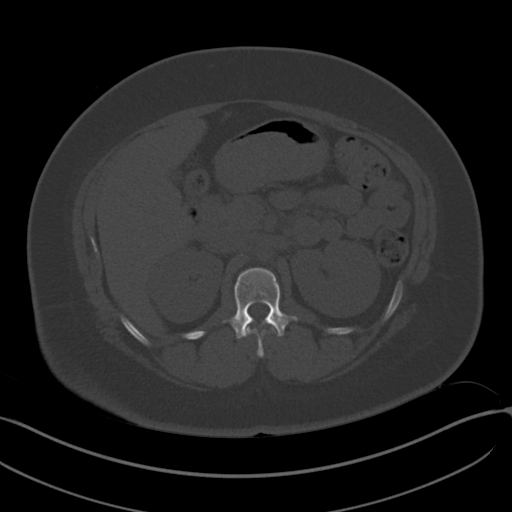
[im 66/93  soft-tissue]
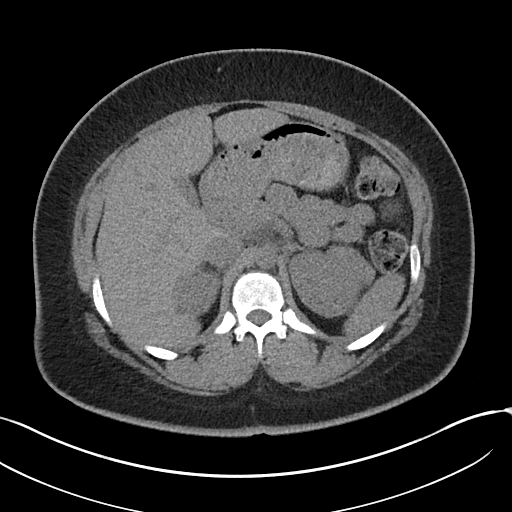
[im 73/93  soft-tissue]
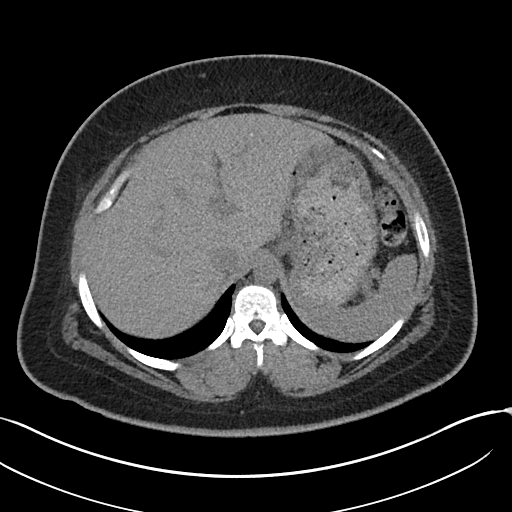
[im 81/93  soft-tissue]
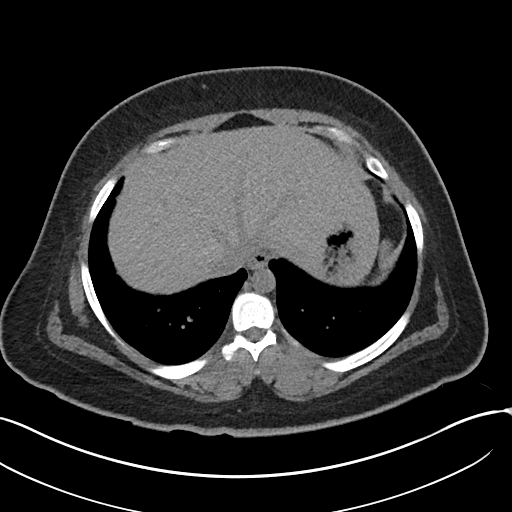
[im 89/93  soft-tissue]
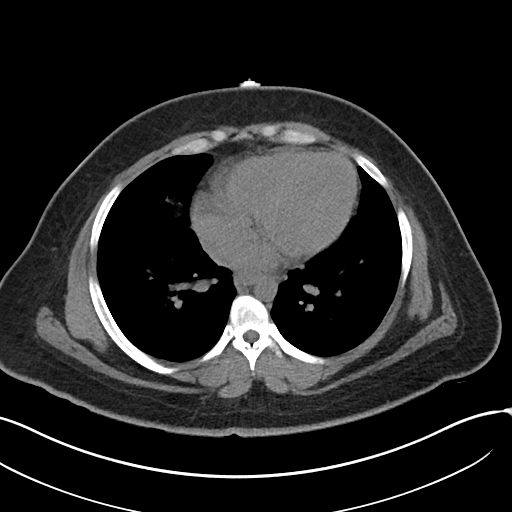

[Series 6: cor st · coronal · 0.70mm/px · 3 of 118 slices shown]
[im 40/118  soft-tissue]
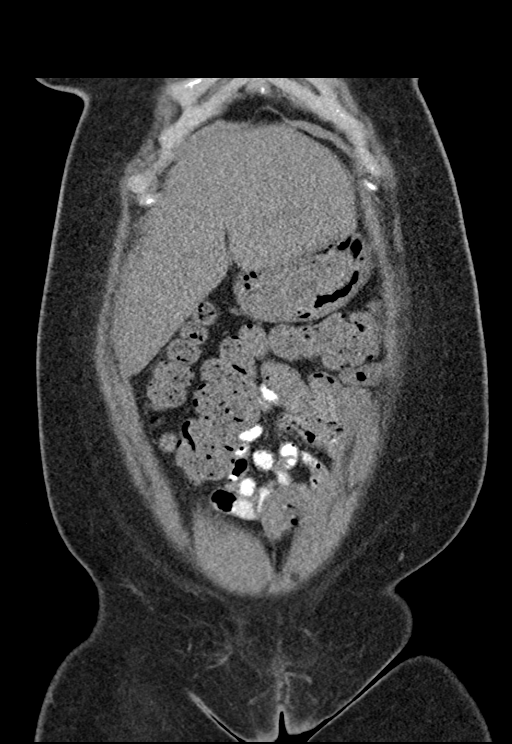
[im 53/118  soft-tissue]
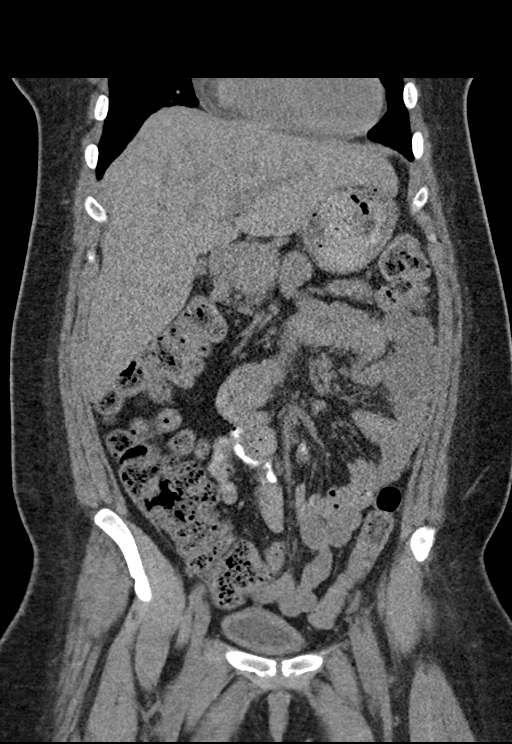
[im 66/118  soft-tissue]
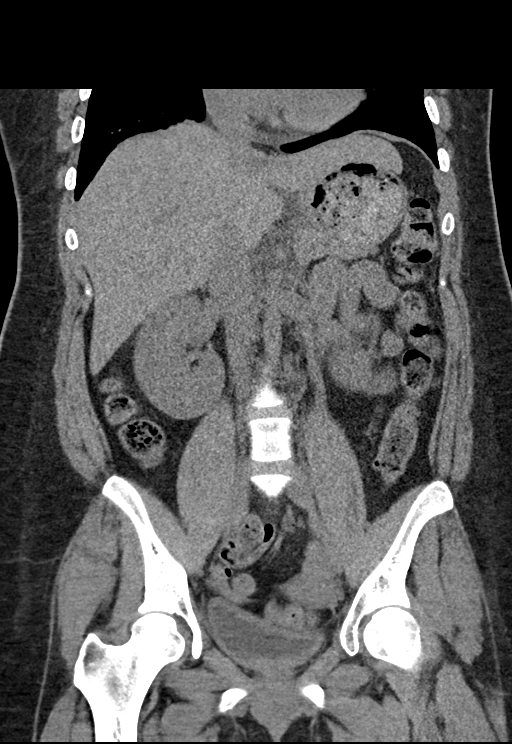

[16 of 46 positions shown; findings below may reference images not displayed]

FINDINGS: Lower chest: No acute pleural or parenchymal lung disease.

Hepatobiliary: No focal liver abnormality is seen. No gallstones,
gallbladder wall thickening, or biliary dilatation.

Pancreas: Unremarkable. No pancreatic ductal dilatation or
surrounding inflammatory changes.

Spleen: Normal in size without focal abnormality.

Adrenals/Urinary Tract: Adrenal glands are unremarkable. Kidneys are
normal, without renal calculi, focal lesion, or hydronephrosis.
Bladder is unremarkable.

Stomach/Bowel: No bowel obstruction or ileus. No wall thickening or
inflammatory change. Normal appendix right lower quadrant.

Vascular/Lymphatic: No significant vascular findings are present. No
enlarged abdominal or pelvic lymph nodes.

Reproductive: Uterus and bilateral adnexa are unremarkable.

Other: No free fluid or free gas.  No abdominal wall hernia.

Musculoskeletal: No acute or destructive bony lesions. Reconstructed
images demonstrate no additional findings.
IMPRESSION: 1. No acute intra-abdominal or intrapelvic process.

## 2020-08-29 IMAGING — DX DG ABDOMEN 2V
2 series · 2 of 2 positions shown · non-contrast
Comparison: 08/24/2019

CLINICAL DATA: Persistent nausea, vomiting, abdominal pain

EXAM:
ABDOMEN - 2 VIEW

[w abdomen upright]
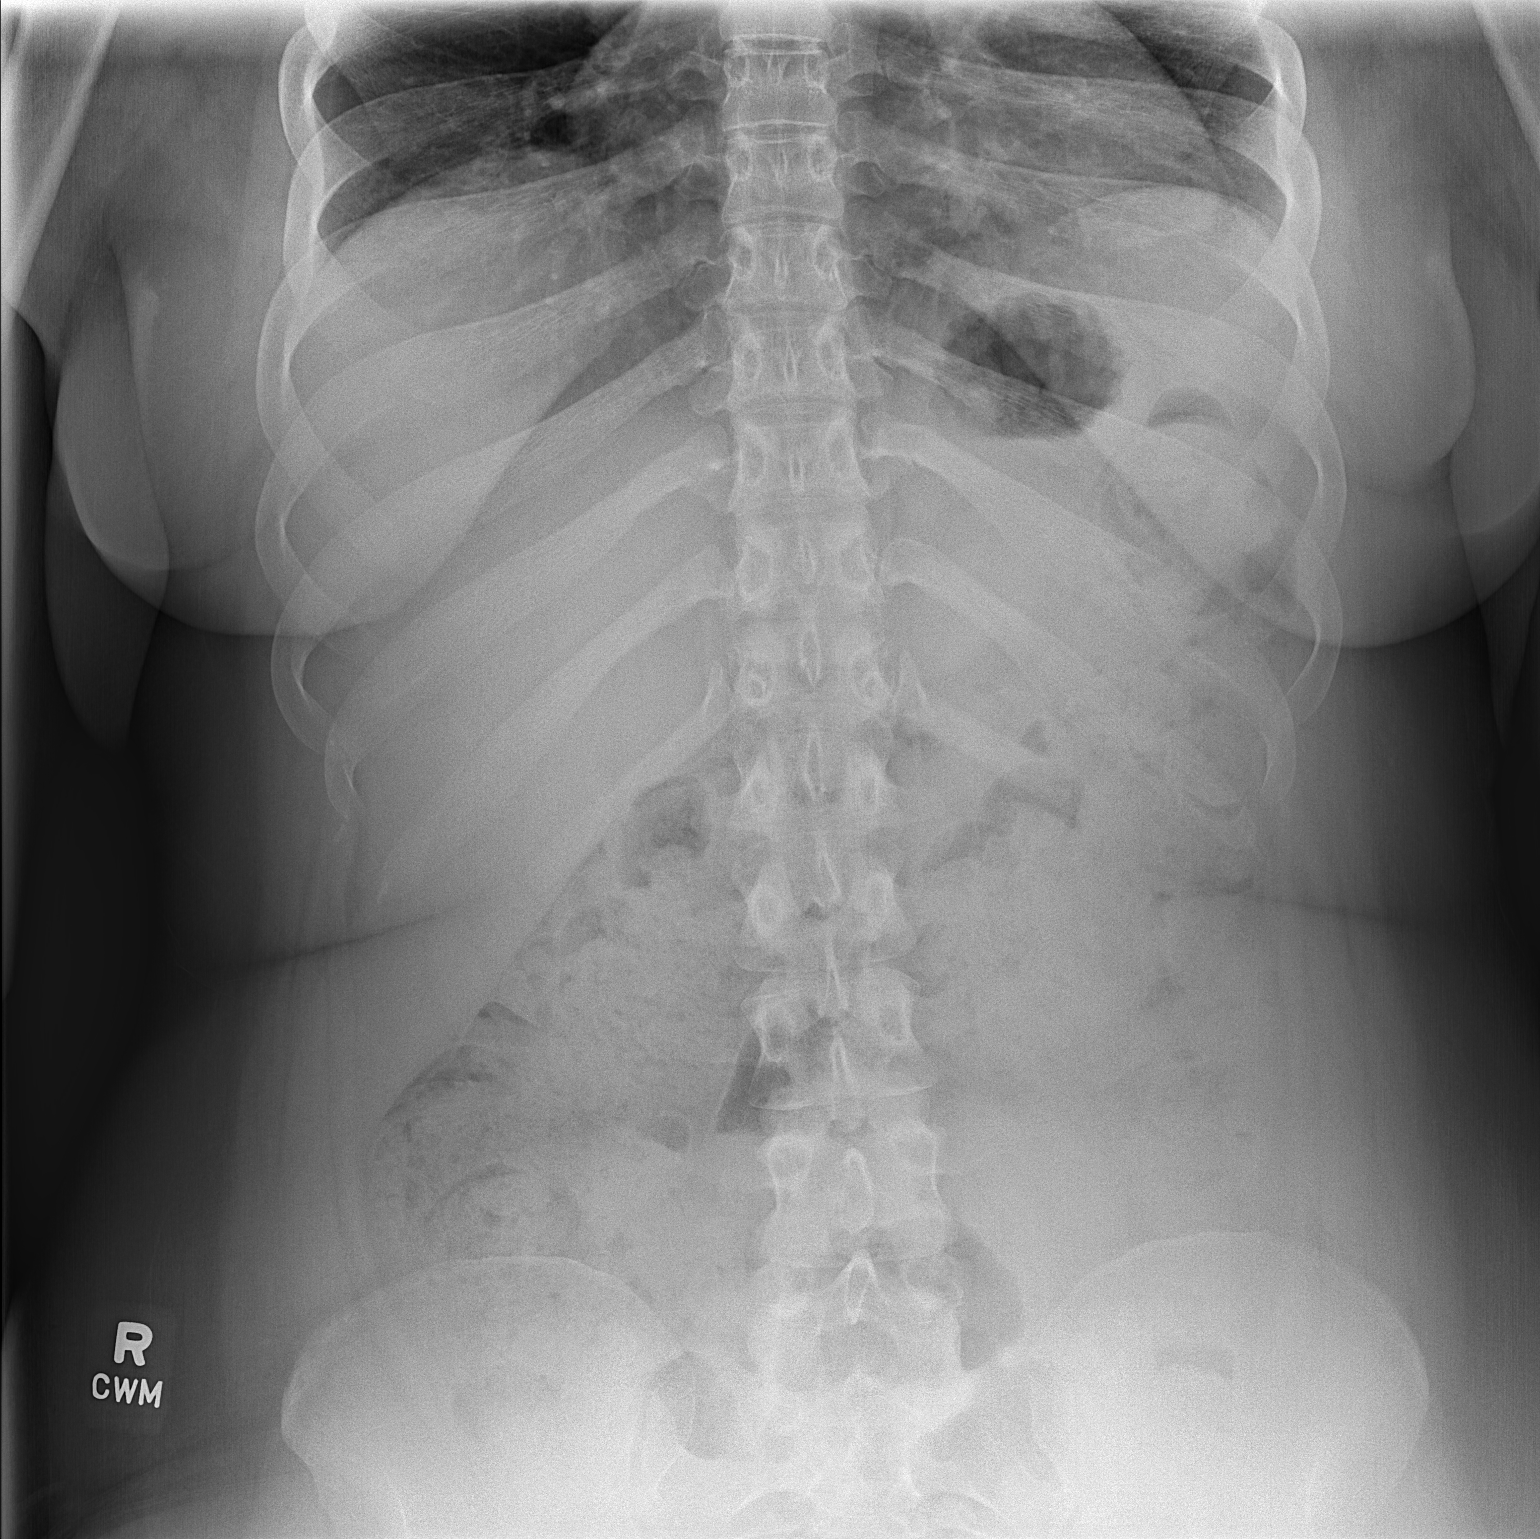

[t abdomen supine]
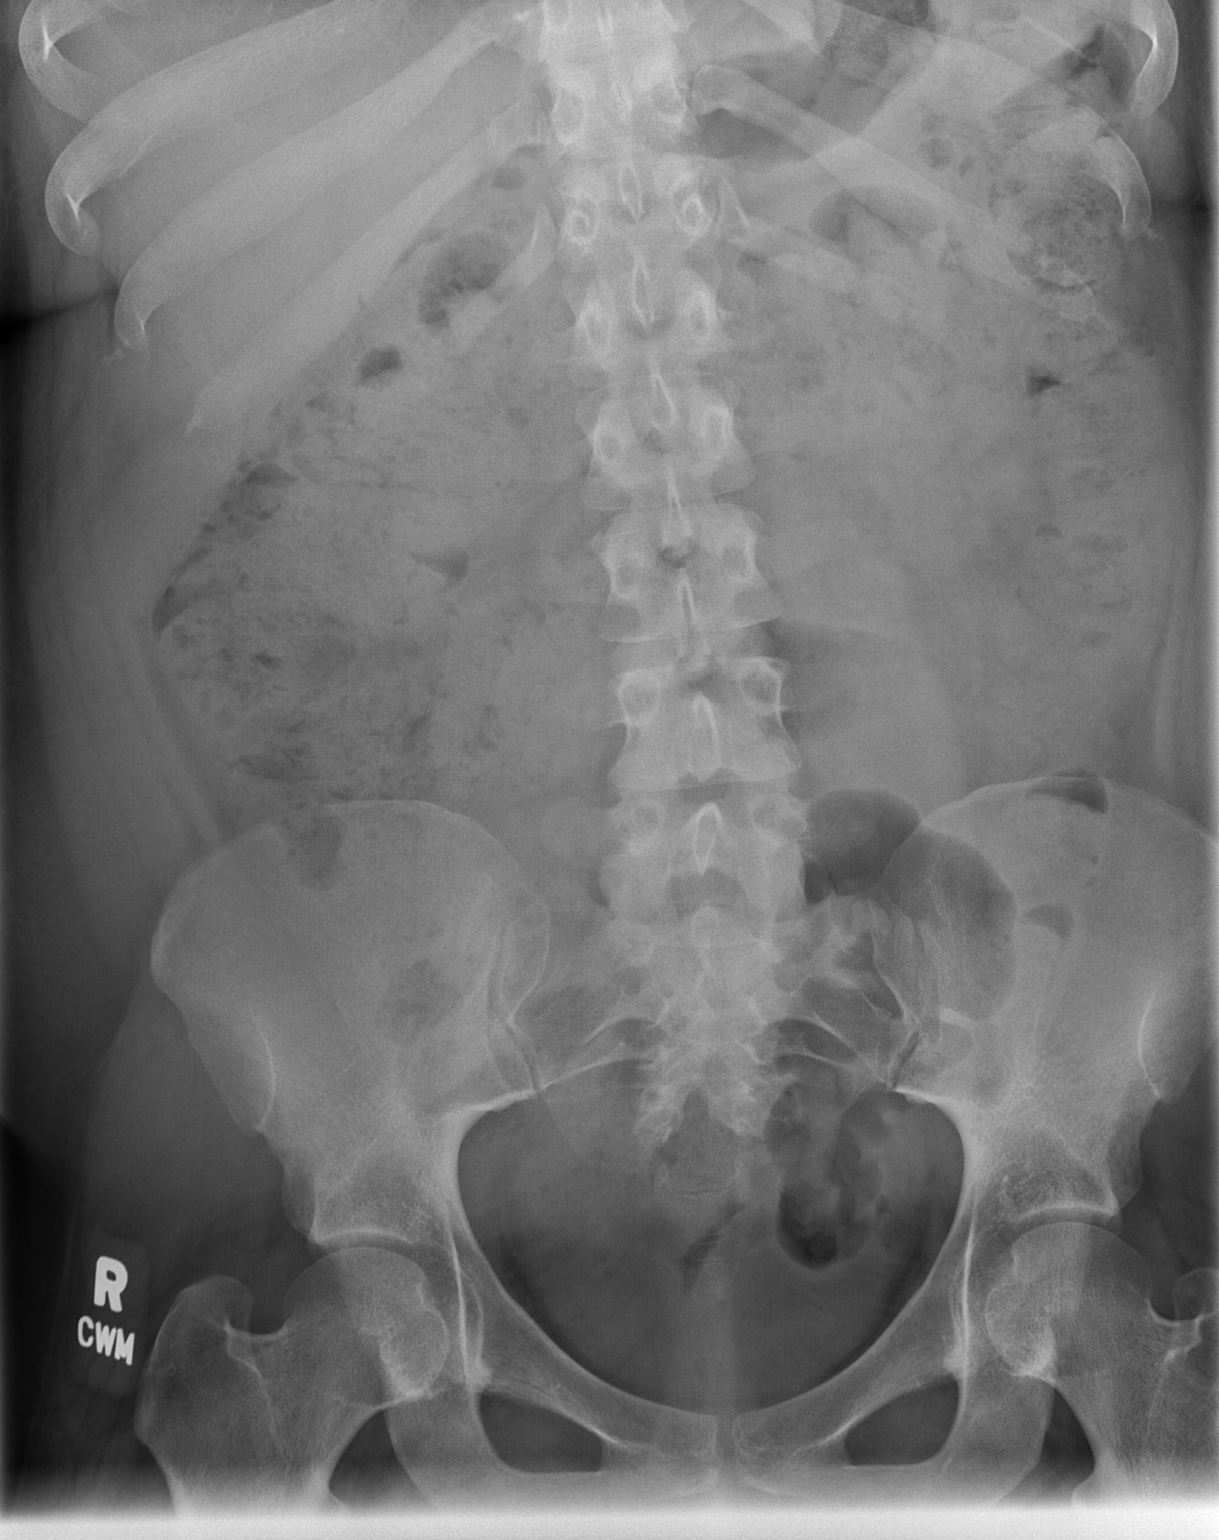

[2 of 2 positions shown; findings below may reference images not displayed]

FINDINGS: Large stool burden throughout the colon. There is normal bowel gas
pattern. No free air. No organomegaly or suspicious calcification.
No acute bony abnormality.
IMPRESSION: Large stool burden.  No acute findings.

## 2020-09-02 IMAGING — DX DG ABDOMEN 1V
1 series · 1 of 1 positions shown · non-contrast
Comparison: None.

CLINICAL DATA: Evaluate NG tube placement

EXAM:
ABDOMEN - 1 VIEW

[abdomen kub]
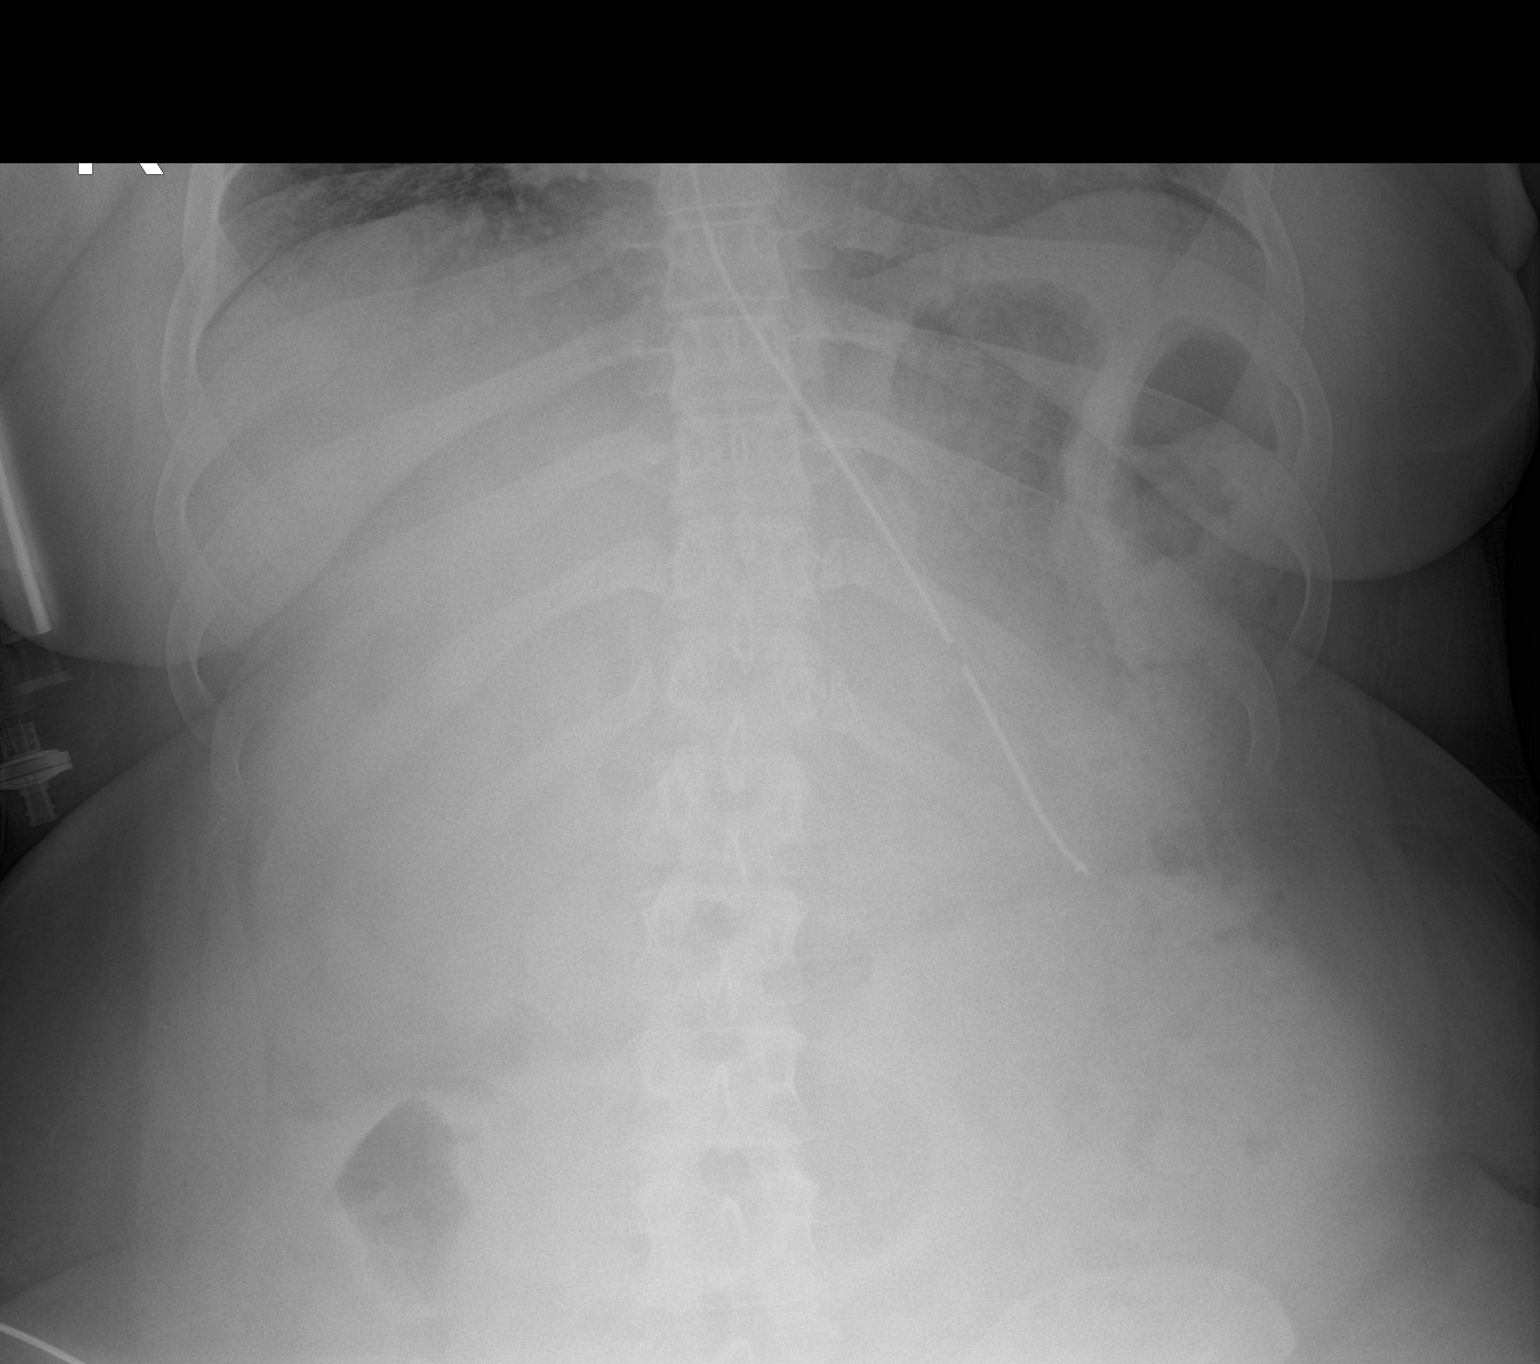

[1 of 1 positions shown; findings below may reference images not displayed]

FINDINGS: A new NG tube terminates in the stomach within the left upper
quadrant of the abdomen.
IMPRESSION: The new NG tube terminates in the left upper quadrant of the
abdomen, within the stomach.

## 2020-09-03 IMAGING — DX DG ABDOMEN 2V
3 series · 3 of 3 positions shown · non-contrast
Comparison: September 03, 2019 and August 30, 2019

CLINICAL DATA: Nausea, vomiting, and abdominal pain for 2 weeks.

EXAM:
ABDOMEN - 2 VIEW

[abdomen erect]
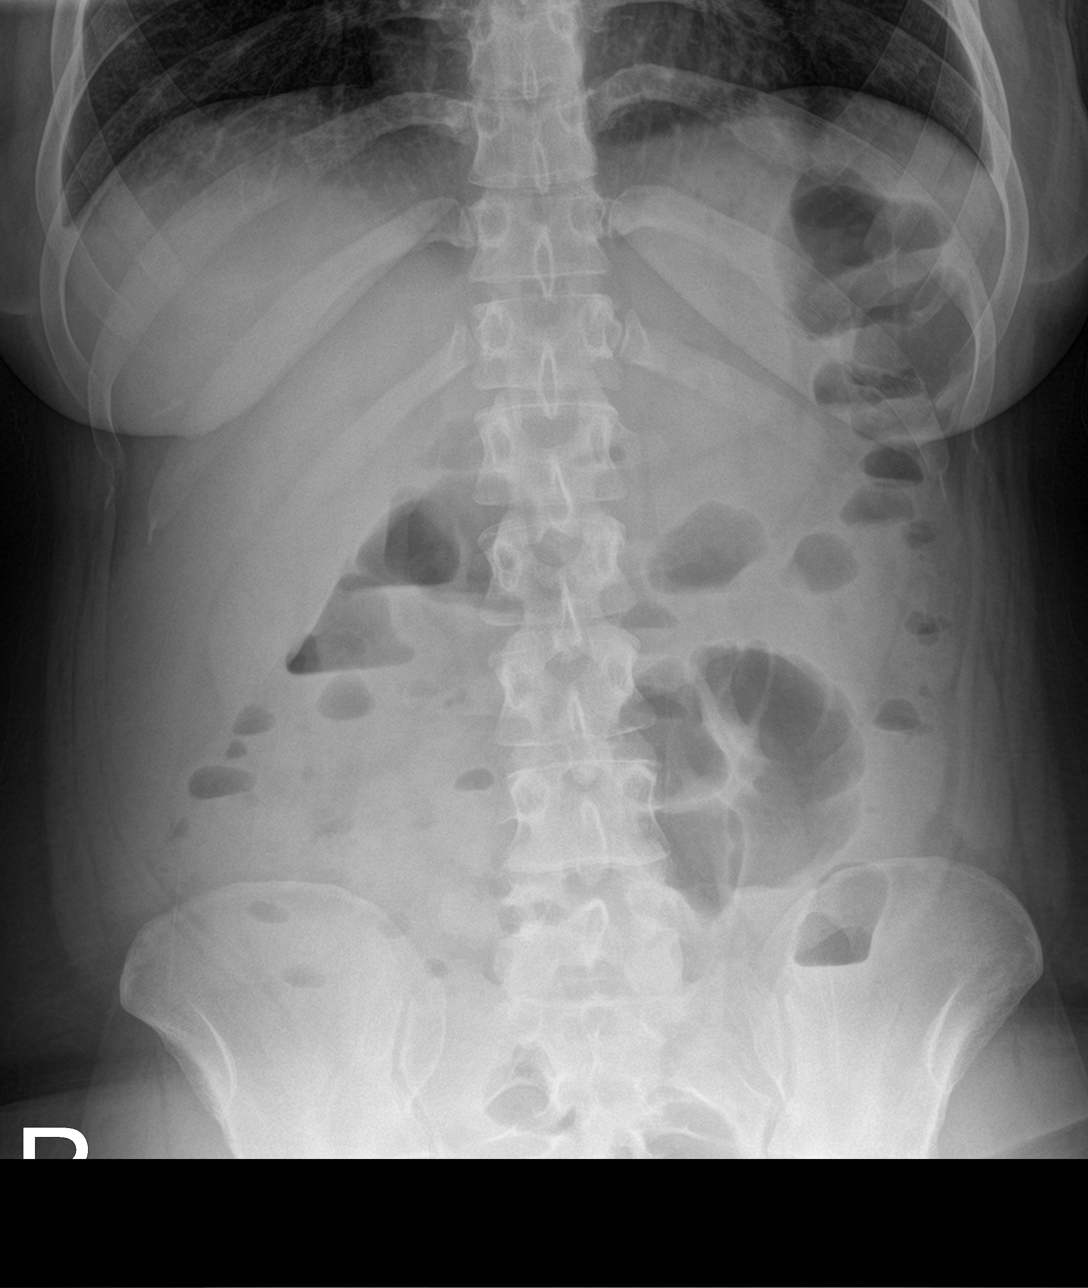

[abdomen supine (1 of 2)]
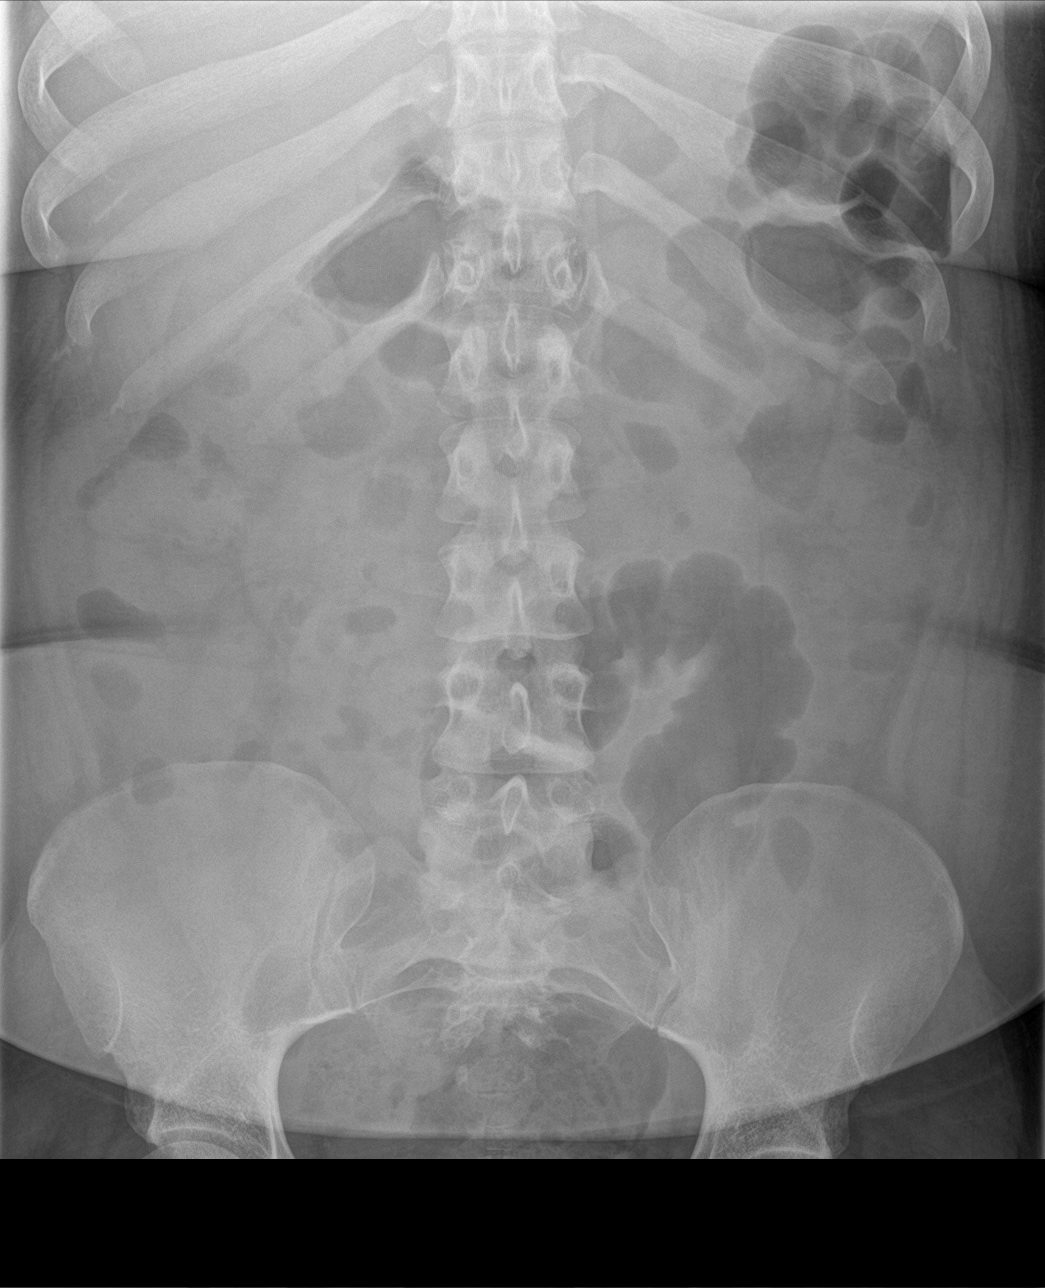

[abdomen supine (2 of 2)]
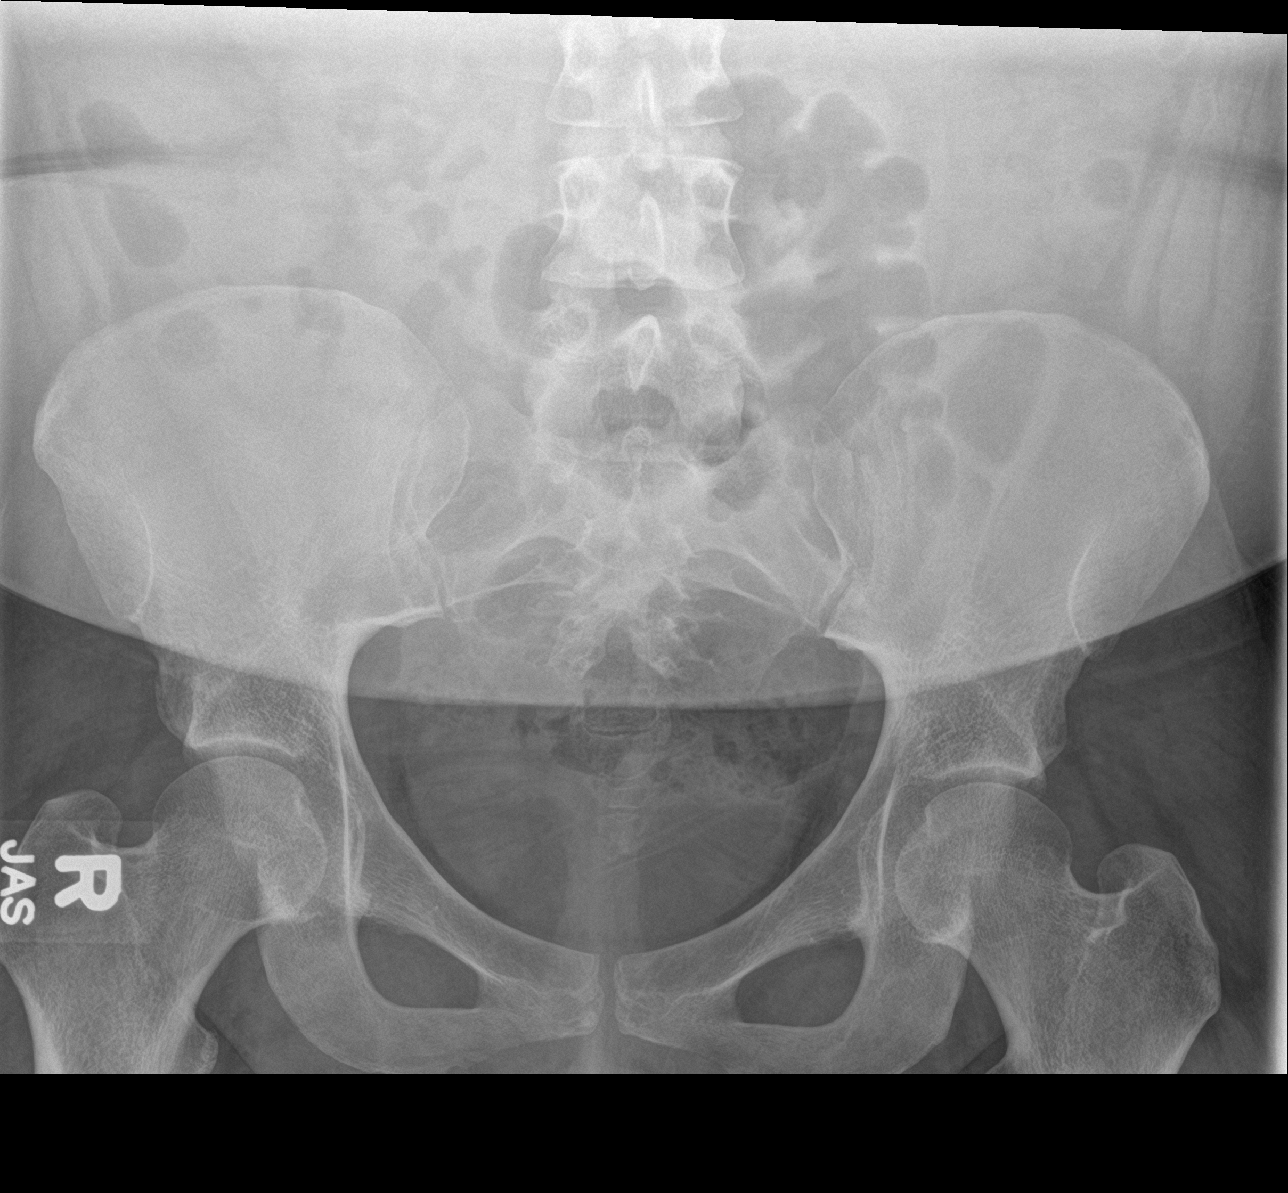

[3 of 3 positions shown; findings below may reference images not displayed]

FINDINGS: A few air-fluid levels are seen throughout the colon. No small bowel
dilatation. No other abnormalities. No free air, portal venous gas,
or pneumatosis. No renal or ureteral stones identified. No other
acute abnormalities.
IMPRESSION: A few air-fluid levels throughout the colon suggest the possibility
of diarrhea. No colonic wall thickening or dilatation noted. No
obstruction.

## 2020-09-29 IMAGING — CT CT MAXILLOFACIAL W/ CM
3 series · 14 of 47 positions shown, 16 images · IV contrast (omnipaque)
Comparison: Head CT today reported separately

CLINICAL DATA: 19-year-old female status post multiple recent
seizures. Has been biting tongue. Pain and swelling around the jaw.

EXAM:
CT MAXILLOFACIAL WITH CONTRAST
TECHNIQUE: Multidetector CT imaging of the maxillofacial structures was
performed with intravenous contrast. Multiplanar CT image
reconstructions were also generated.
CONTRAST:  75mL OMNIPAQUE IOHEXOL 300 MG/ML  SOLN

[Series 3: facialbone 2.0 st · axial · 0.41mm/px · z∈[-284,-116]mm · 8 of 98 slices shown, 10 images]
[im 7/98  brain]
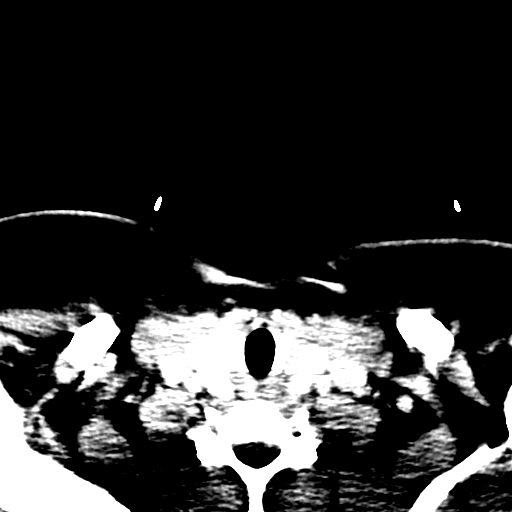
[im 7/98  bone]
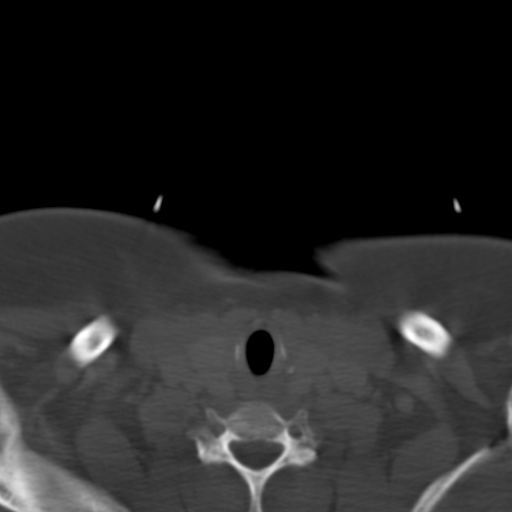
[im 21/98  bone]
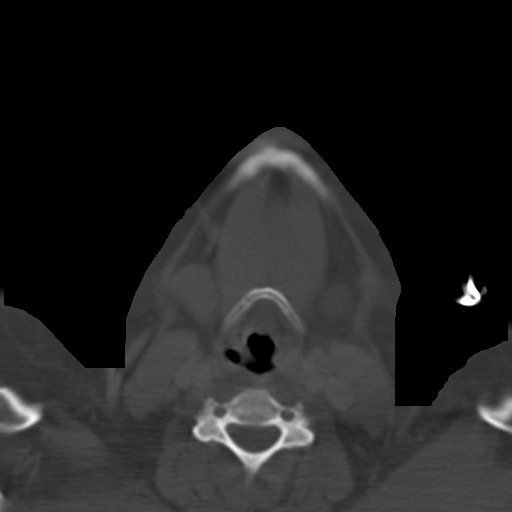
[im 31/98  bone]
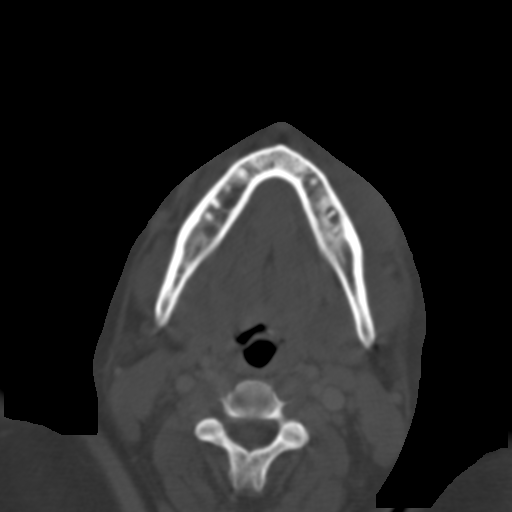
[im 44/98  bone]
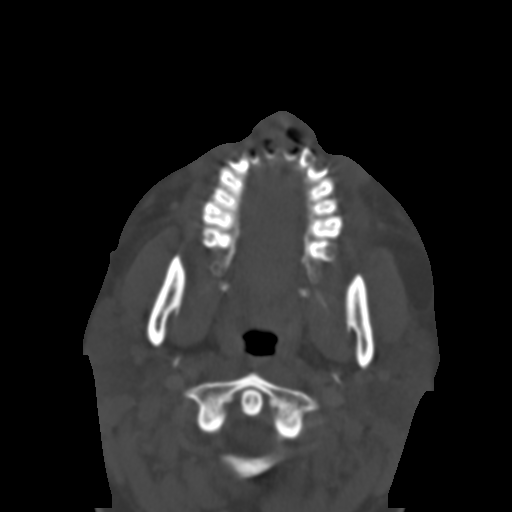
[im 54/98  brain]
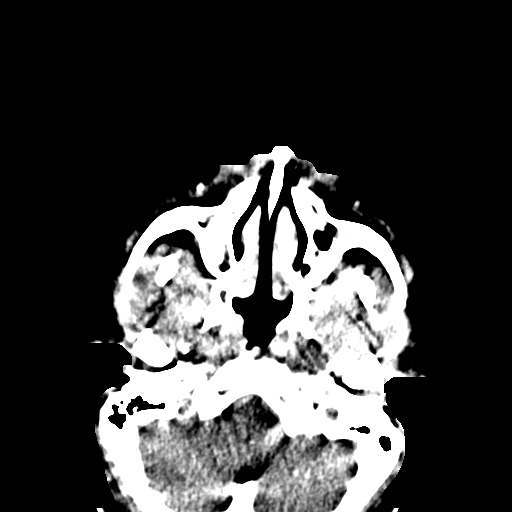
[im 54/98  bone]
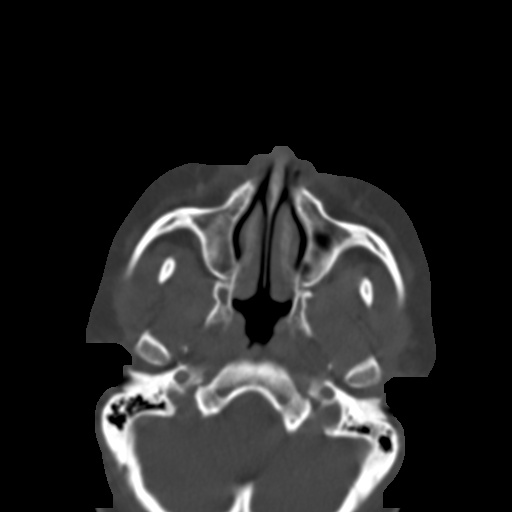
[im 67/98  bone]
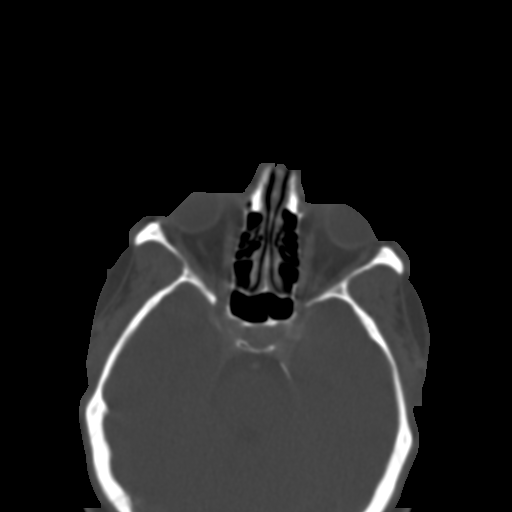
[im 77/98  bone]
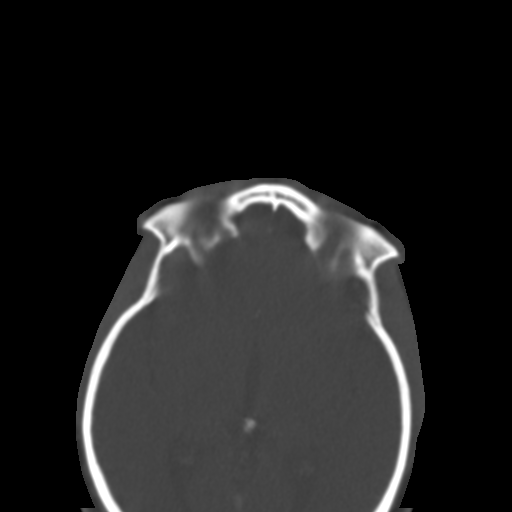
[im 91/98  bone]
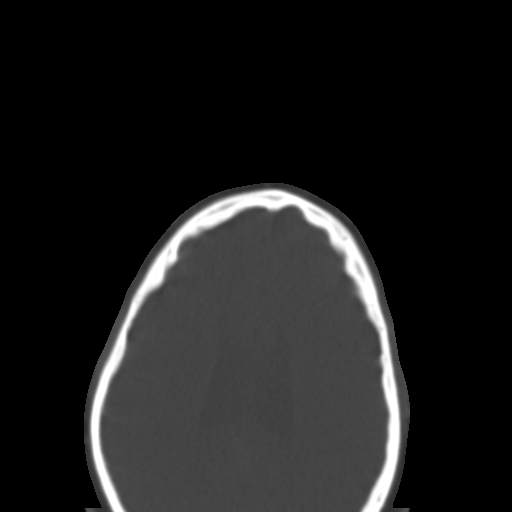

[Series 7: facialbone 2.0 cor st · coronal · 0.39mm/px · 3 of 88 slices shown]
[im 30/88  bone]
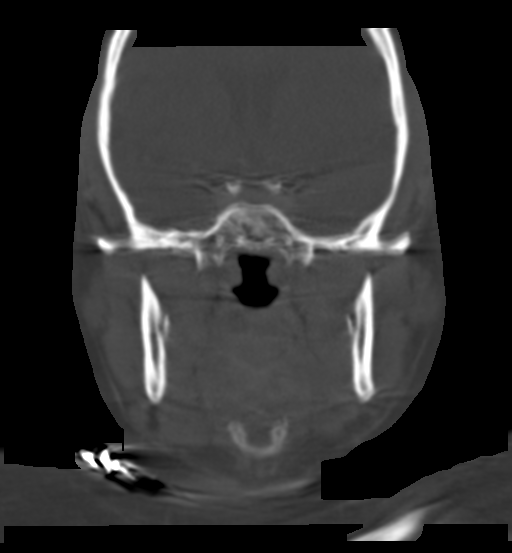
[im 39/88  bone]
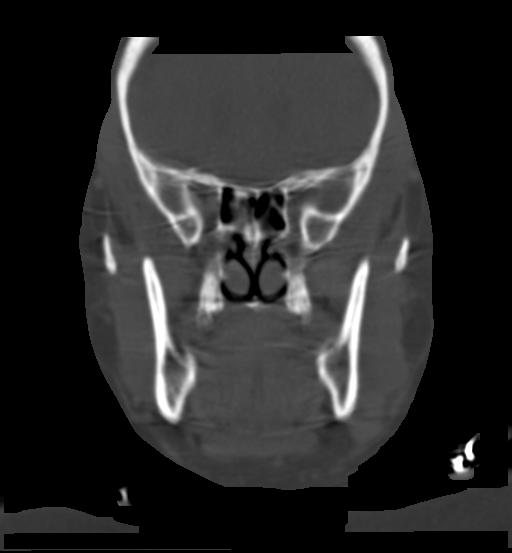
[im 49/88  bone]
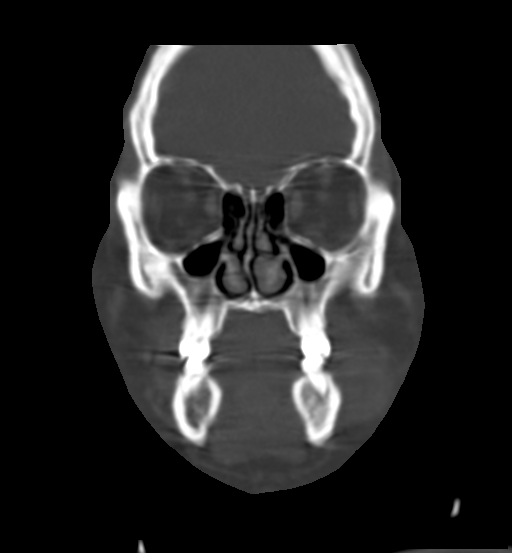

[Series 8: facialbone 2.0 sag st · sagittal · 0.36mm/px · 3 of 103 slices shown]
[im 35/103  bone]
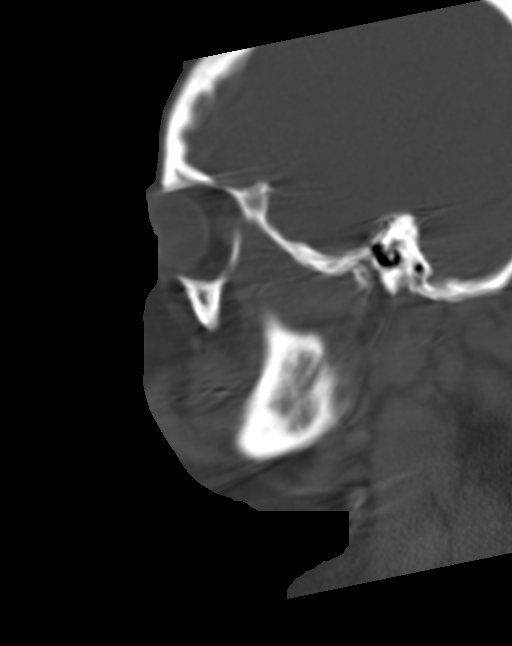
[im 52/103  bone]
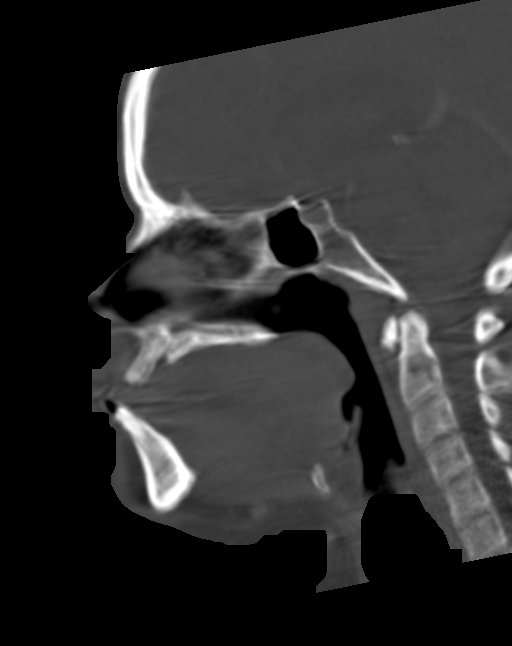
[im 69/103  bone]
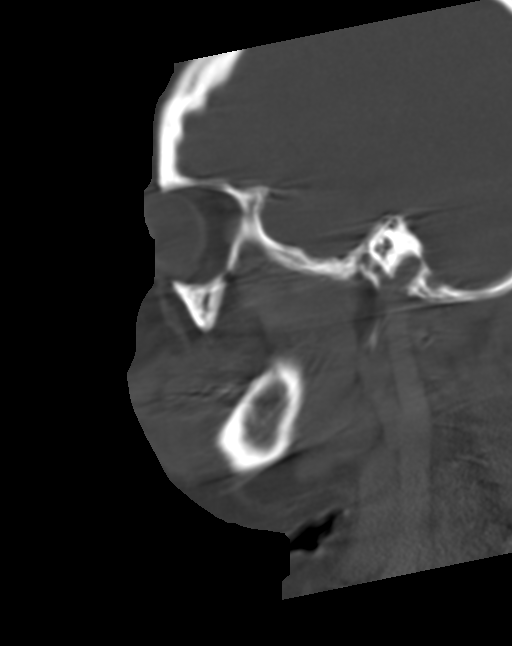

[14 of 47 positions shown; findings below may reference images not displayed]

FINDINGS: Osseous: Mild motion artifact.

Multifocal carious dentition, including the anterior maxillary and
mandible dentition.

Periapical lucency at the left mandible anterior molar is associated
with some lateral cortical breakthrough (series 4, image 34) at the
level of asymmetric left facial soft tissue swelling and stranding.
See additional soft tissue details below.

No mandible or facial fracture identified. Visible calvarium and
cervical vertebrae appear intact.

Orbits: Orbital walls appear intact.  Negative orbits soft tissues.

Sinuses: Somewhat hypoplastic, but clear throughout.

Soft tissues: Asymmetric soft tissue swelling and stranding around
the body of the left mandible, epicenter at the anterior mandible
molar as detailed above. And there is evidence of a small 10 mm
subperiosteal abscess seen on coronal series 7, image 40. But no
other soft tissue fluid is identified. No soft tissue gas.

Mild left lower masticator space involvement. The sublingual space
is spared. The submandibular, parotid, parapharyngeal, and
retropharyngeal spaces are spared. Negative visible thyroid, larynx
and pharynx.

Reactive level 1 lymph nodes measure up to 14 mm short axis (left
level 1 B station). No cystic or necrotic nodes.

The visible major vascular structures in the neck and at the skull
base are patent.

Limited intracranial: Negative, see also head CT today reported
separately.
IMPRESSION: 1. Extensive dental caries with odontogenic Cellulitis around the
body of the left mandible. Small 10 mm subperiosteal abscess
overlying the carious left anterior mandible molar. Reactive level 1
lymphadenopathy.

2. No other acute finding.  Mildly degraded by motion.

## 2020-09-29 IMAGING — CT CT ABD-PELV W/ CM
2 of 5 series · 16 of 46 positions shown, 18 images · IV contrast (Omni 300)
Comparison: 08/26/2019

CLINICAL DATA: Nonlocalized abdominal pain

EXAM:
CT ABDOMEN AND PELVIS WITH CONTRAST
TECHNIQUE: Multidetector CT imaging of the abdomen and pelvis was performed
using the standard protocol following bolus administration of
intravenous contrast.
CONTRAST:  80mL OMNIPAQUE IOHEXOL 300 MG/ML  SOLN

[Series 3: a/p w/ 5mm · axial · 0.91mm/px · z∈[+659,+1099]mm · 13 of 100 slices shown, 15 images]
[im 6/100  soft-tissue]
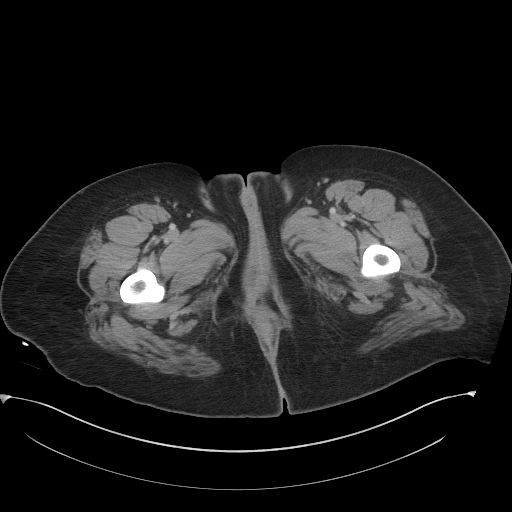
[im 6/100  bone]
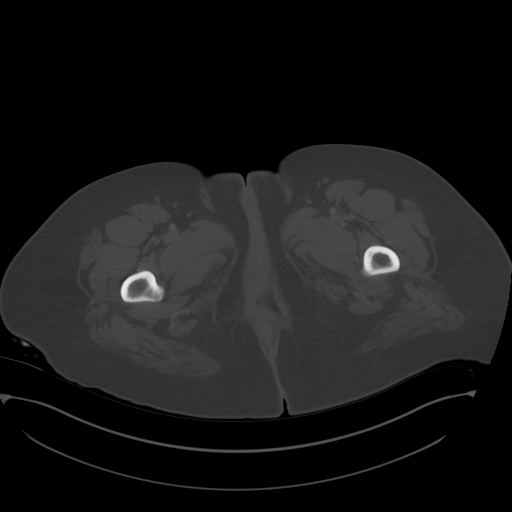
[im 16/100  soft-tissue]
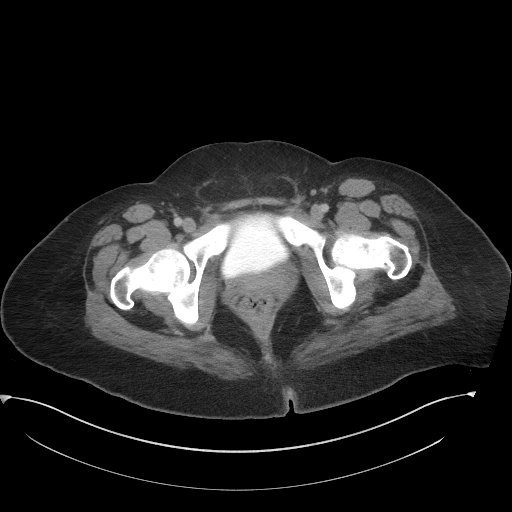
[im 21/100  soft-tissue]
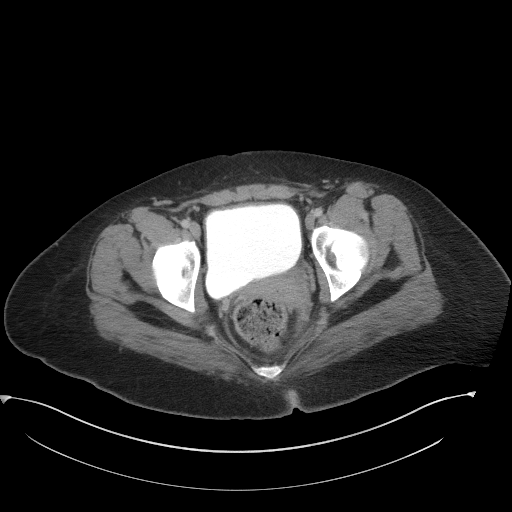
[im 27/100  soft-tissue]
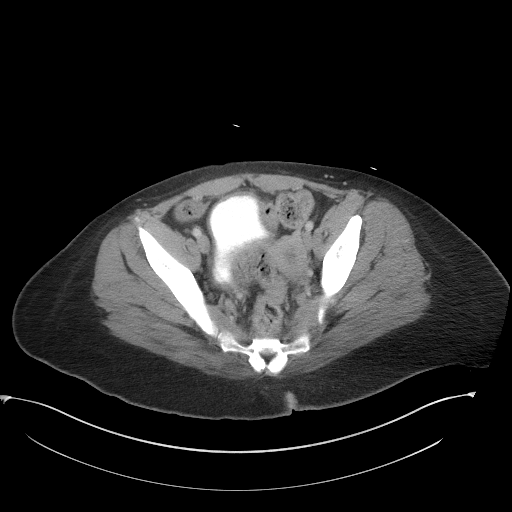
[im 37/100  soft-tissue]
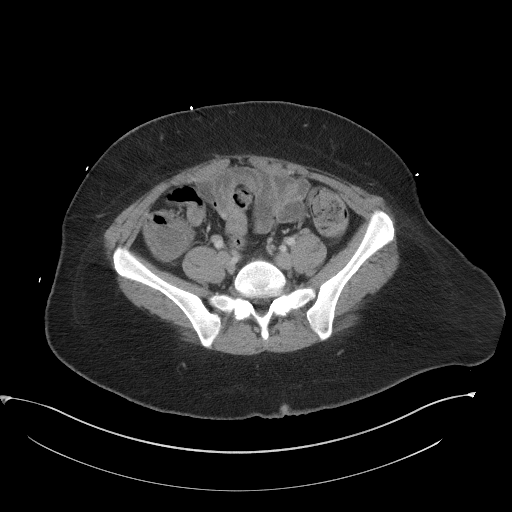
[im 42/100  soft-tissue]
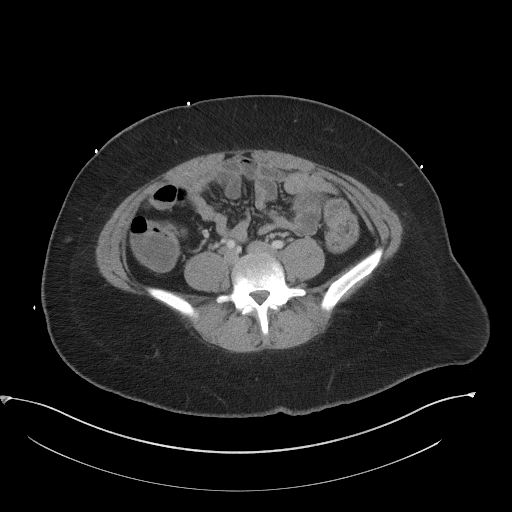
[im 53/100  soft-tissue]
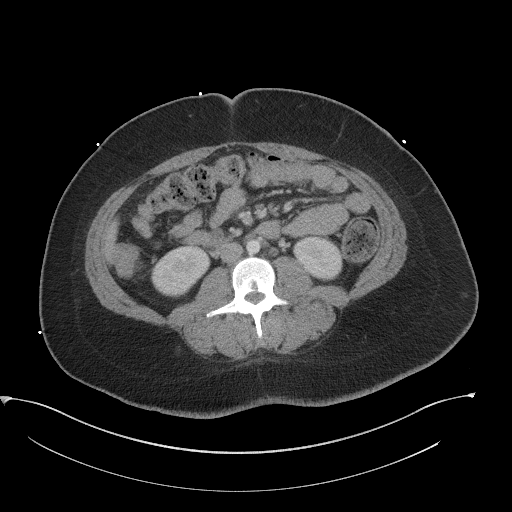
[im 58/100  soft-tissue]
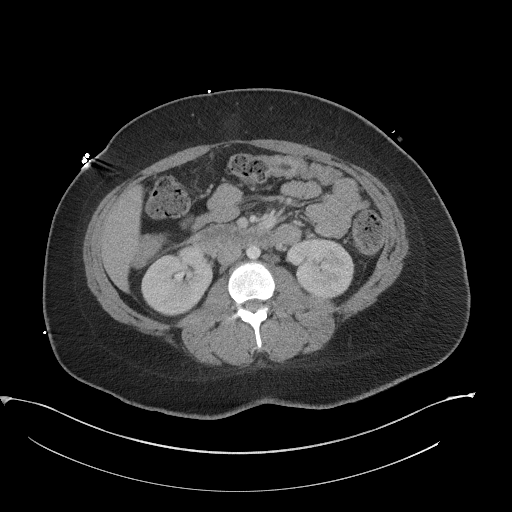
[im 63/100  soft-tissue]
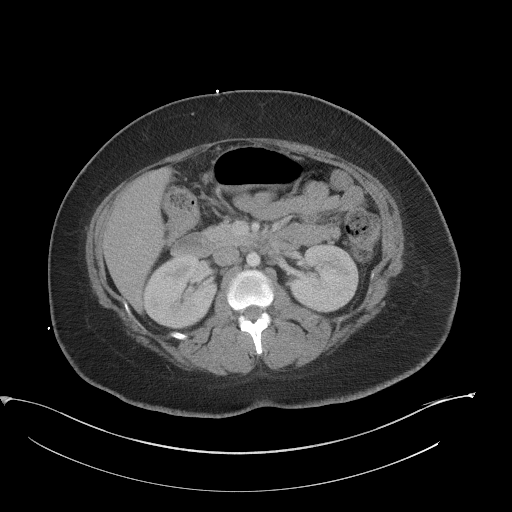
[im 63/100  bone]
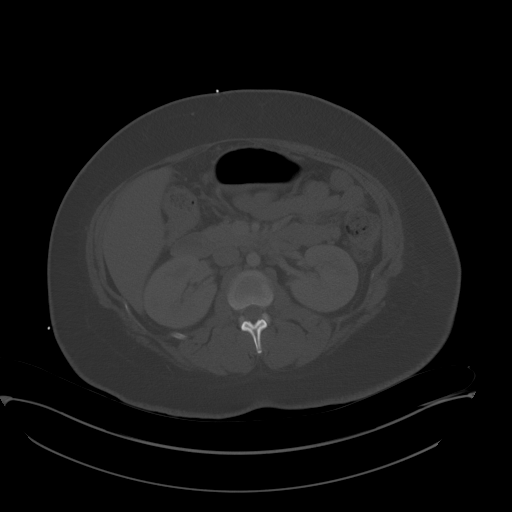
[im 73/100  soft-tissue]
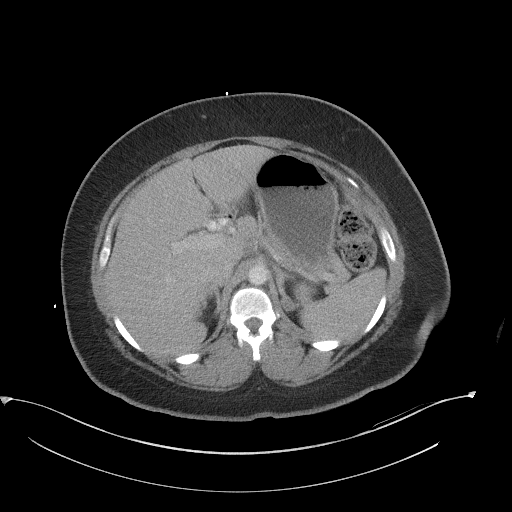
[im 79/100  soft-tissue]
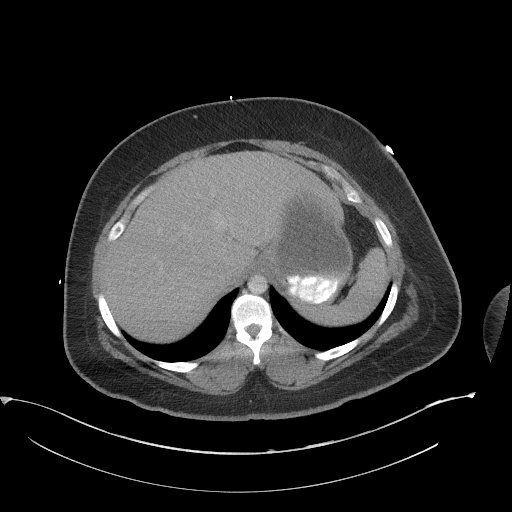
[im 84/100  soft-tissue]
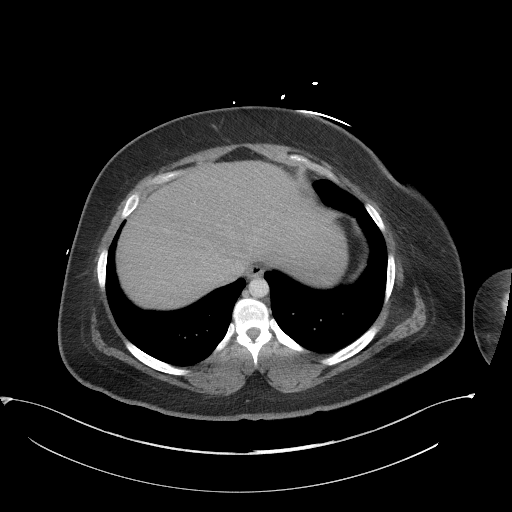
[im 94/100  soft-tissue]
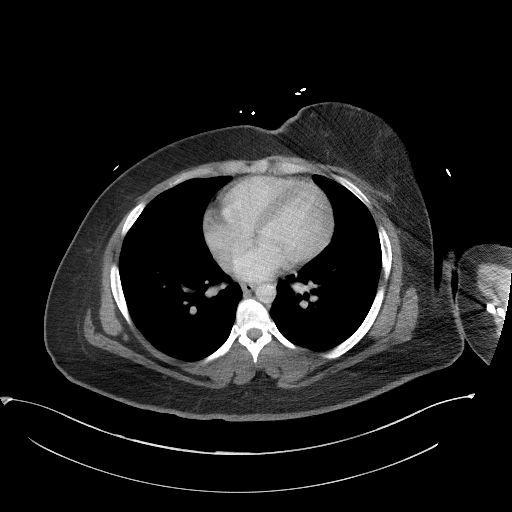

[Series 6: a/p w/ cor · coronal · 0.97mm/px · 3 of 151 slices shown]
[im 51/151  soft-tissue]
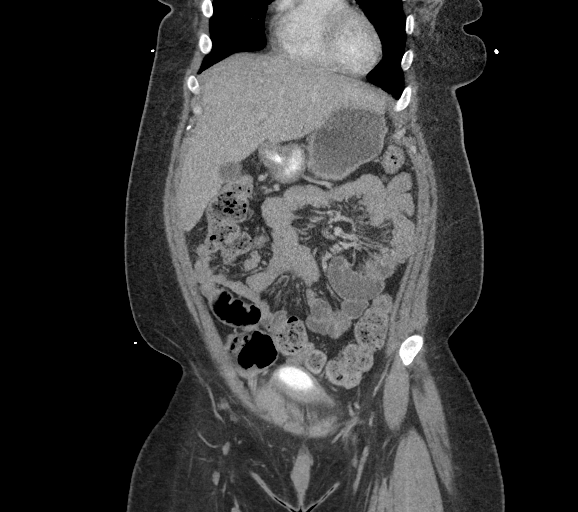
[im 67/151  soft-tissue]
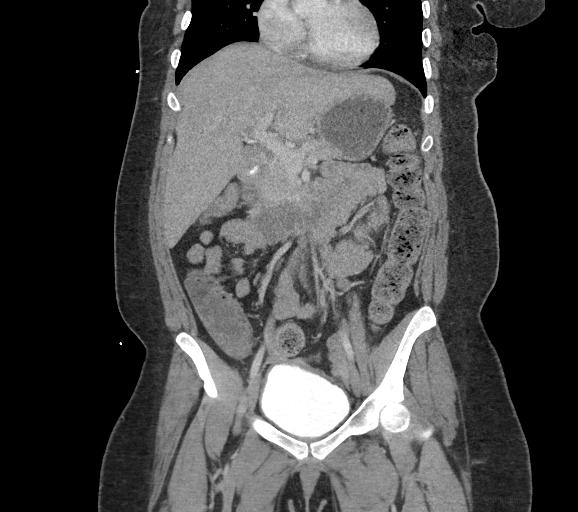
[im 84/151  soft-tissue]
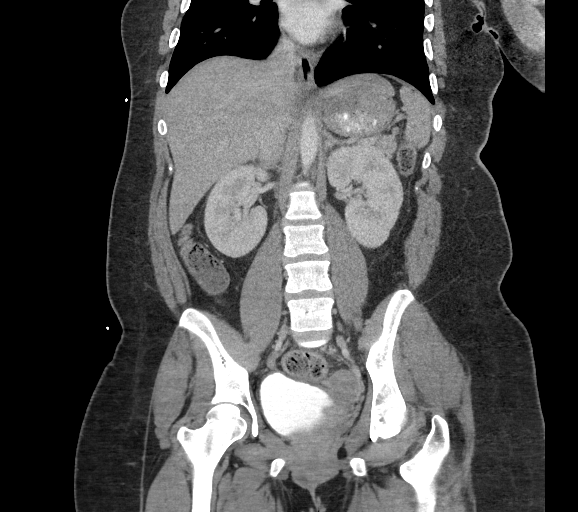

[16 of 46 positions shown; findings below may reference images not displayed]

FINDINGS: Lower chest: Lung bases are clear.

Hepatobiliary: No focal liver abnormality is seen. No gallstones,
gallbladder wall thickening, or biliary dilatation.

Pancreas: Unremarkable. No pancreatic ductal dilatation or
surrounding inflammatory changes.

Spleen: Normal in size without focal abnormality.

Adrenals/Urinary Tract: 1.4 cm diameter left adrenal gland nodule.
Kidneys are symmetrical with homogeneous nephrograms. No
hydronephrosis or hydroureter. No focal masses. Bladder is contrast
filled although there is no contrast in the renal collecting systems
or ureters. This suggest either previous contrast administration or
contrast cystogram recently. No bladder wall thickening or filling
defect.

Stomach/Bowel: Stomach, small bowel, and colon are not abnormally
distended. Colon is diffusely stool-filled. No wall thickening or
inflammatory changes are appreciated. The appendix is normal.

Vascular/Lymphatic: Scattered retroperitoneal lymph nodes are not
pathologically enlarged. Normal caliber abdominal aorta.

Reproductive: Uterus and bilateral adnexa are unremarkable.

Other: No free air or free fluid in the abdomen. Abdominal wall
musculature appears intact.

Musculoskeletal: No acute or significant osseous findings.
IMPRESSION: 1. No acute process demonstrated in the abdomen or pelvis. No
evidence of bowel obstruction or inflammation.
2. 1.4 cm diameter left adrenal gland nodule, nonspecific but likely
adenoma.

## 2020-09-29 IMAGING — DX DG ABDOMEN 2V
3 series · 3 of 3 positions shown · non-contrast
Comparison: 09/04/2019

CLINICAL DATA: NAUSEA AND VOMITING FOR 4 DAYS

EXAM:
X-RAY ABDOMEN 2 VIEWS

[x abdomen erect]
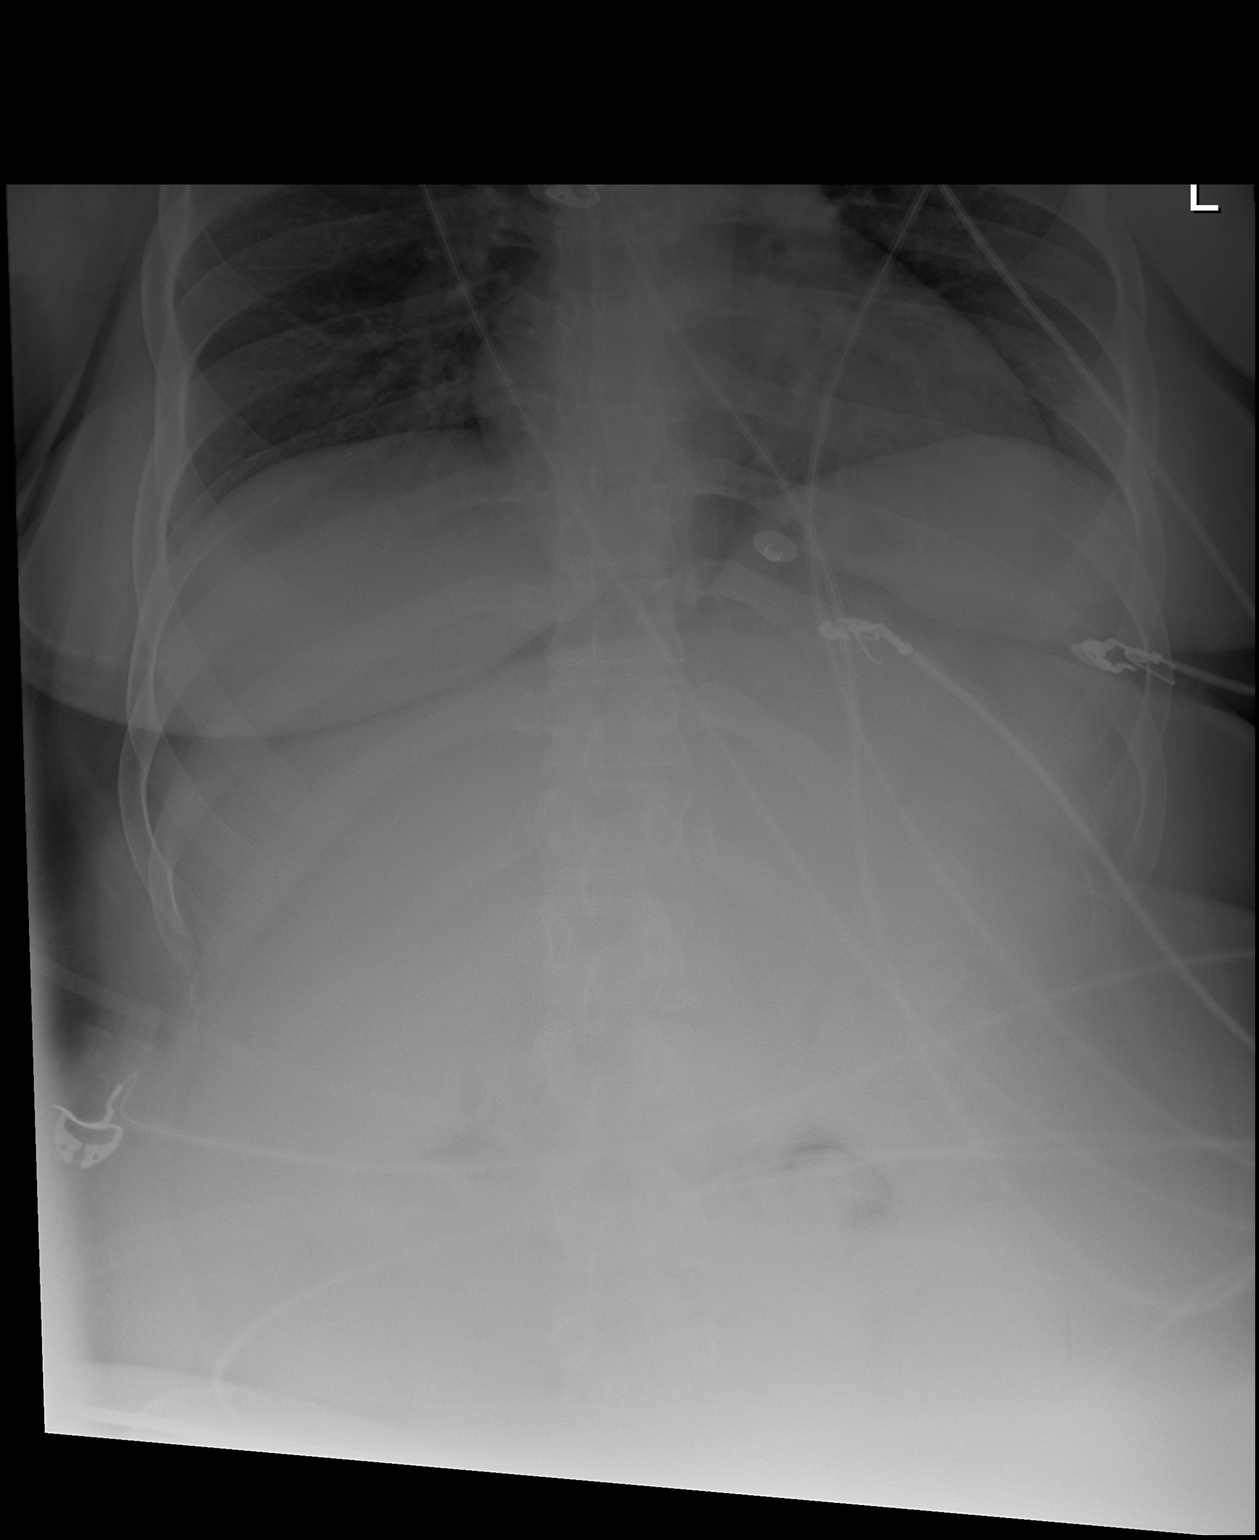

[x abdomen supine (1 of 2)]
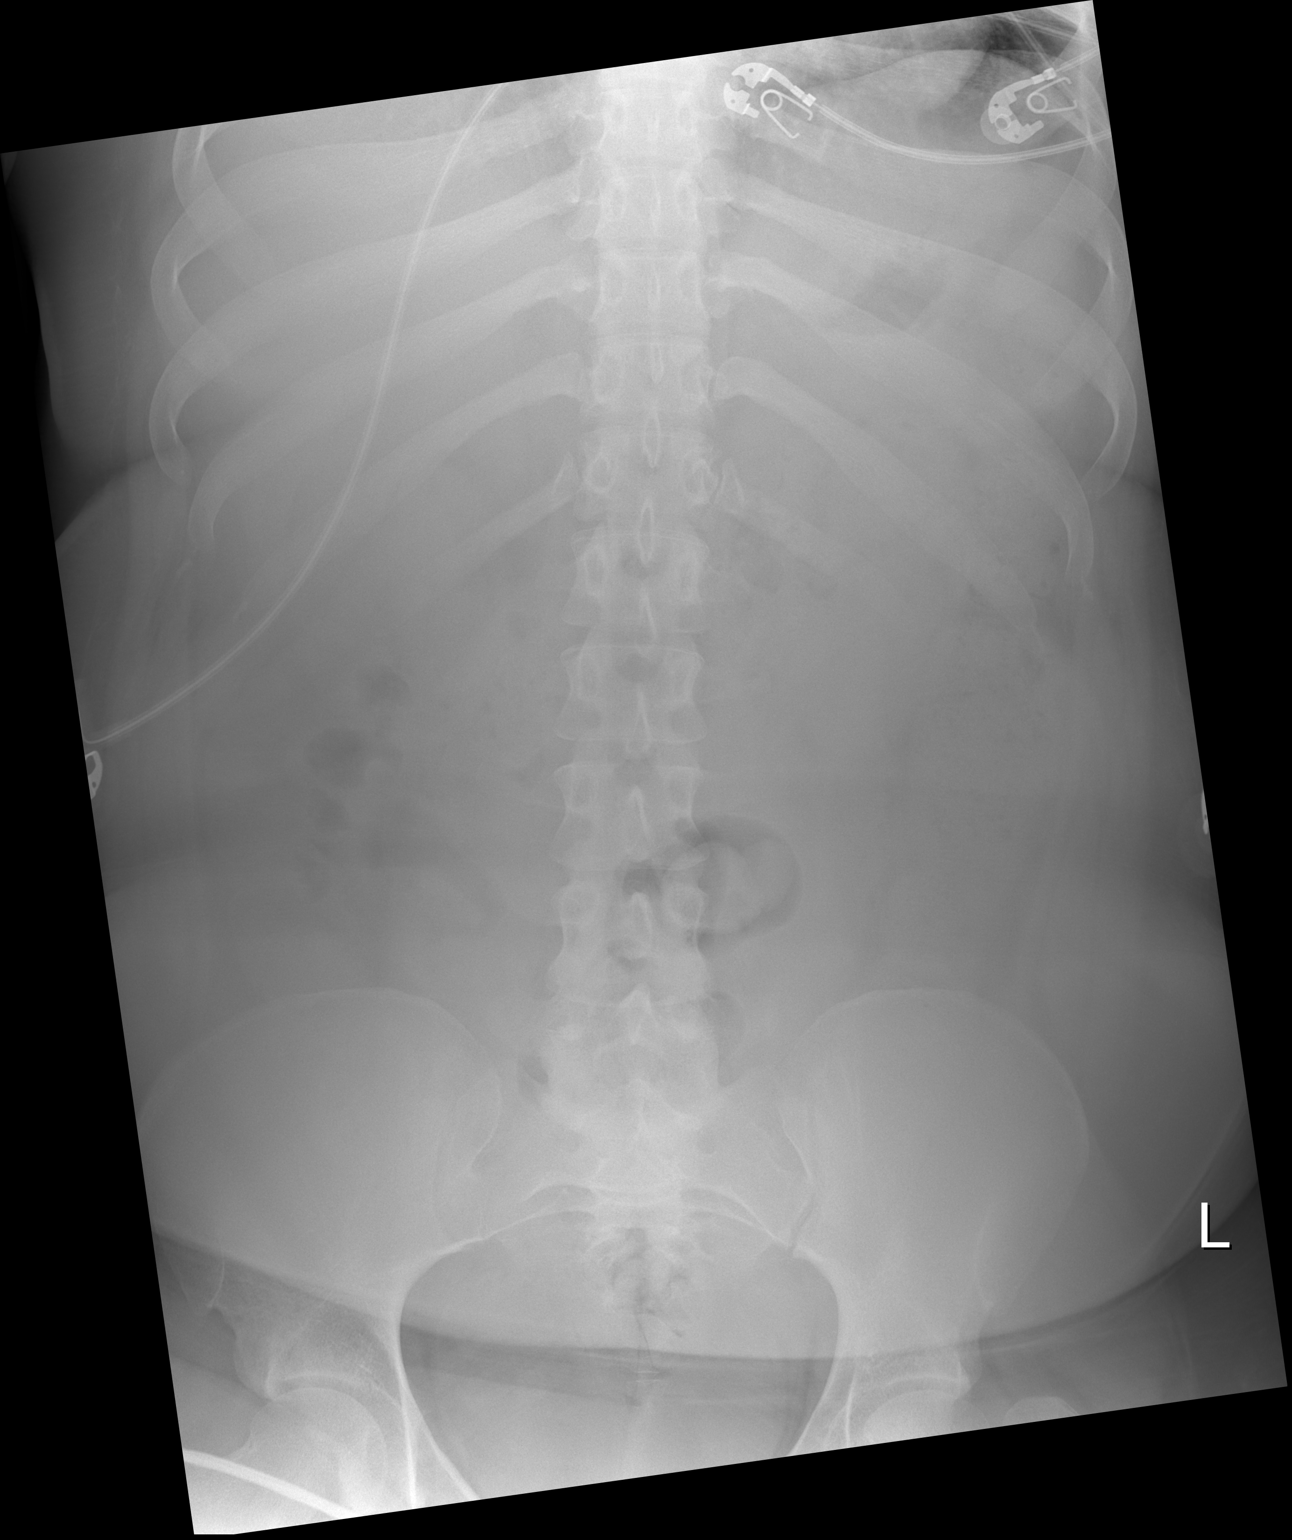

[x abdomen supine (2 of 2)]
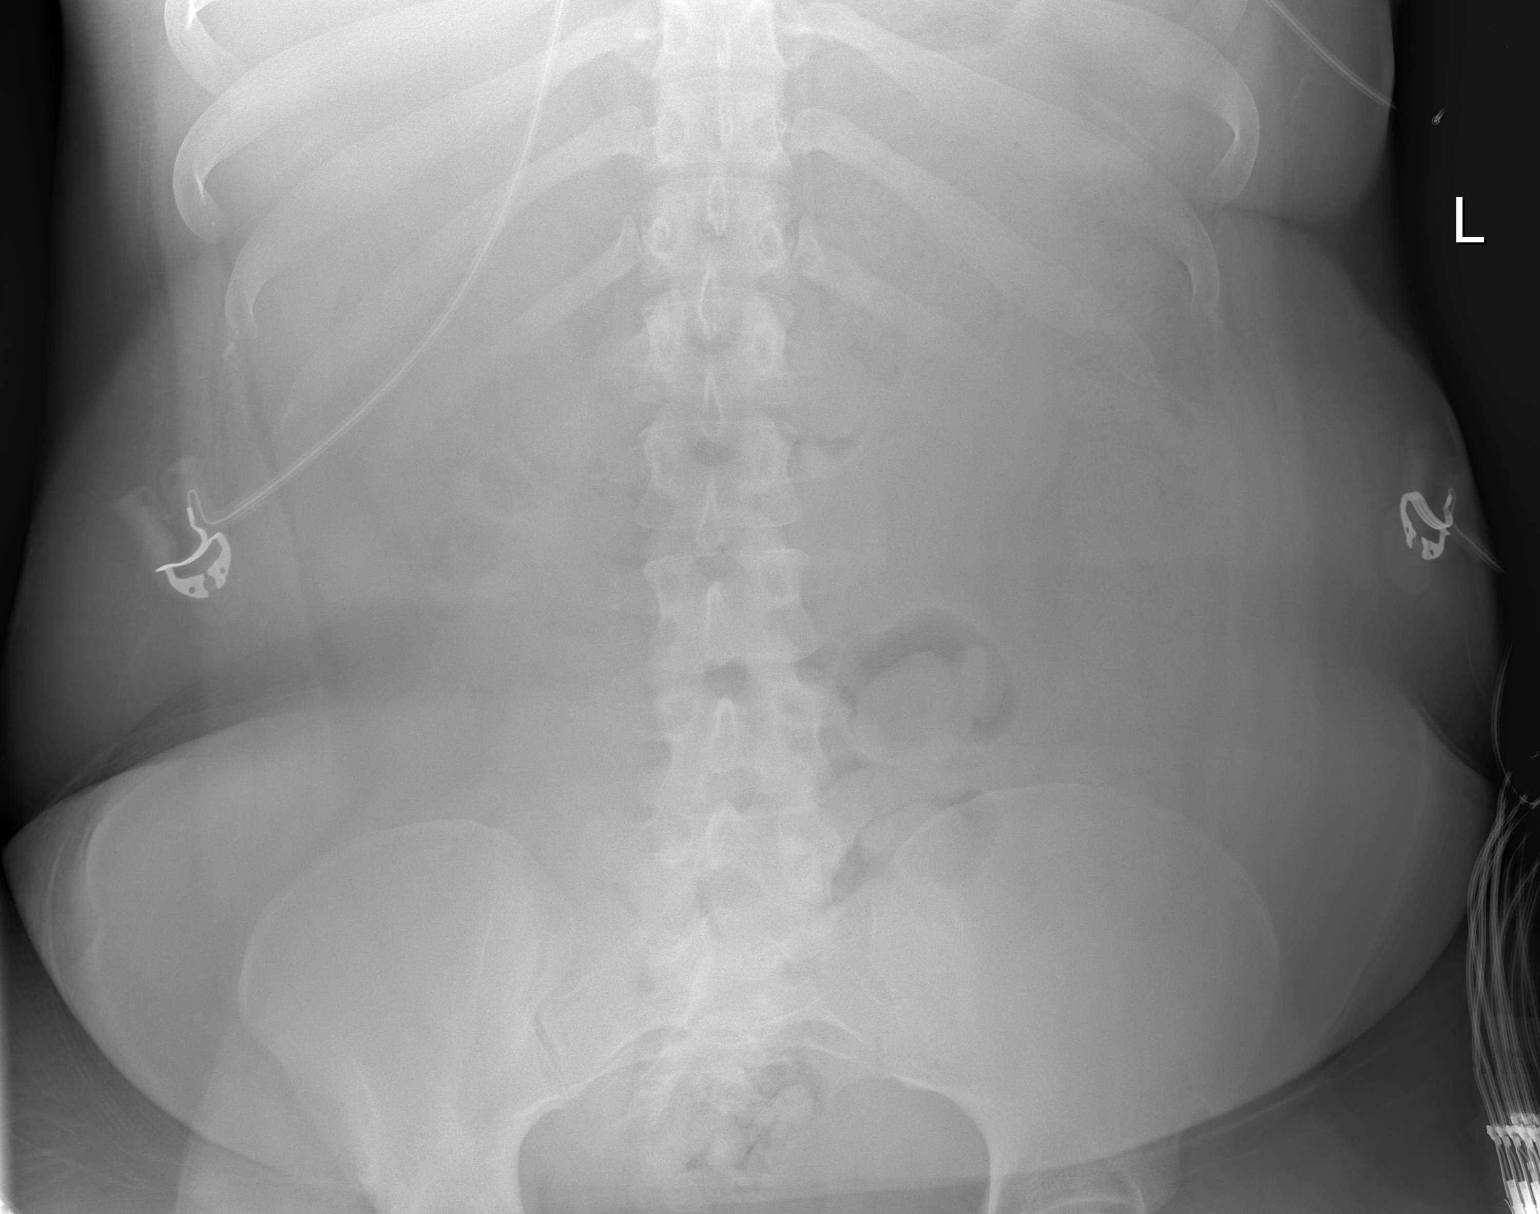

[3 of 3 positions shown; findings below may reference images not displayed]

FINDINGS: Supine and upright frontal views of the abdomen and pelvis
demonstrate a nonobstructive bowel gas pattern. There is a paucity
of bowel gas. Minimal stool within the distal colon. No masses or
abnormal calcifications. No free gas in the greater peritoneal sac.
IMPRESSION: 1. No evidence of bowel obstruction or ileus.  Paucity of bowel gas.

## 2020-10-18 IMAGING — CR DG HIP (WITH OR WITHOUT PELVIS) 2-3V*L*
4 series · 4 of 4 positions shown · non-contrast
Comparison: None.

CLINICAL DATA: Left hip pain after fall.

EXAM:
DG HIP (WITH OR WITHOUT PELVIS) 2-3V LEFT

[t pelvis ap]
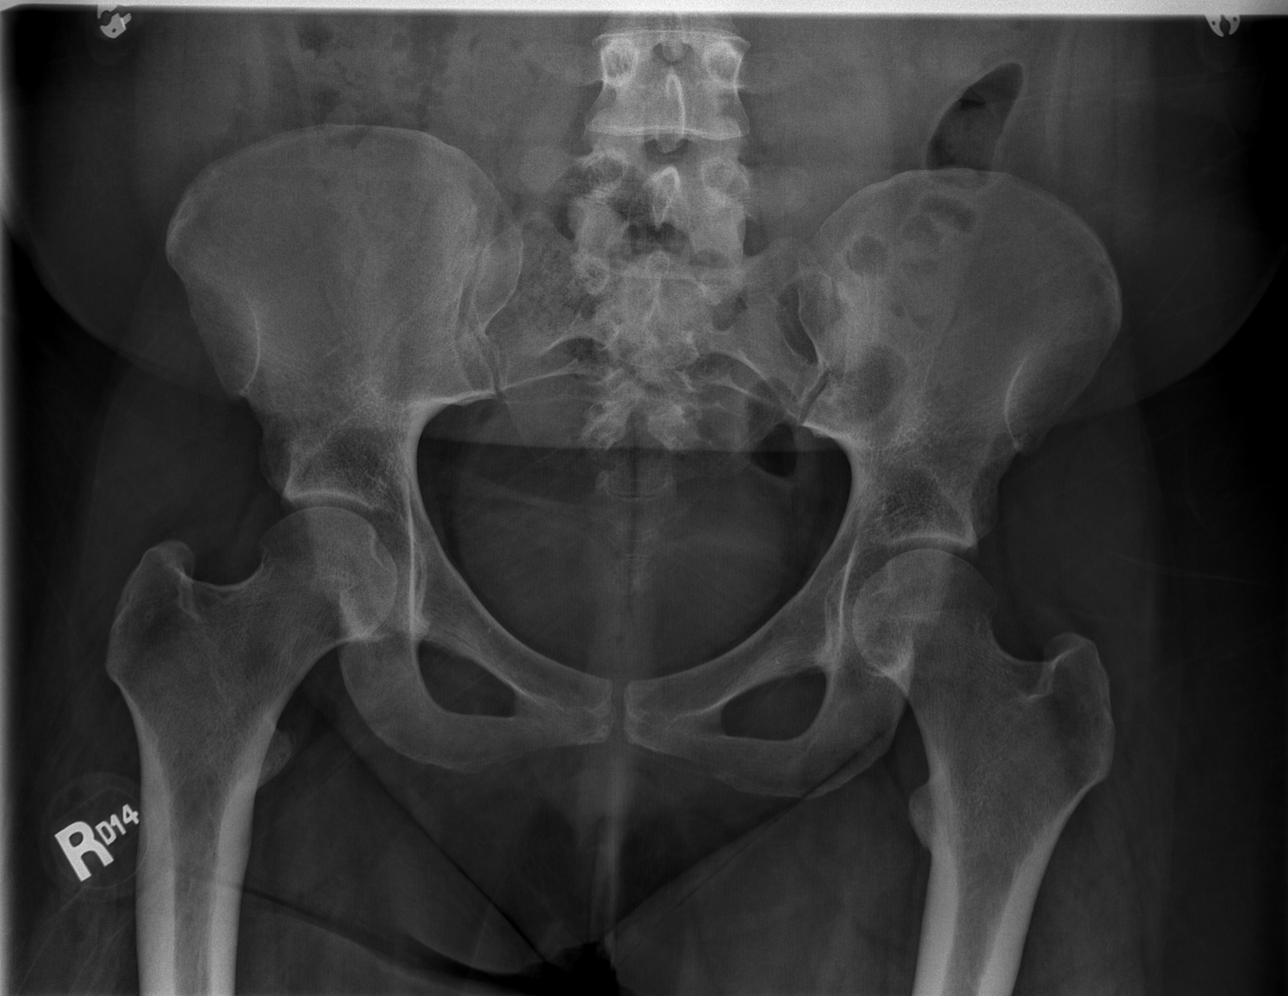

[t hip ap left]
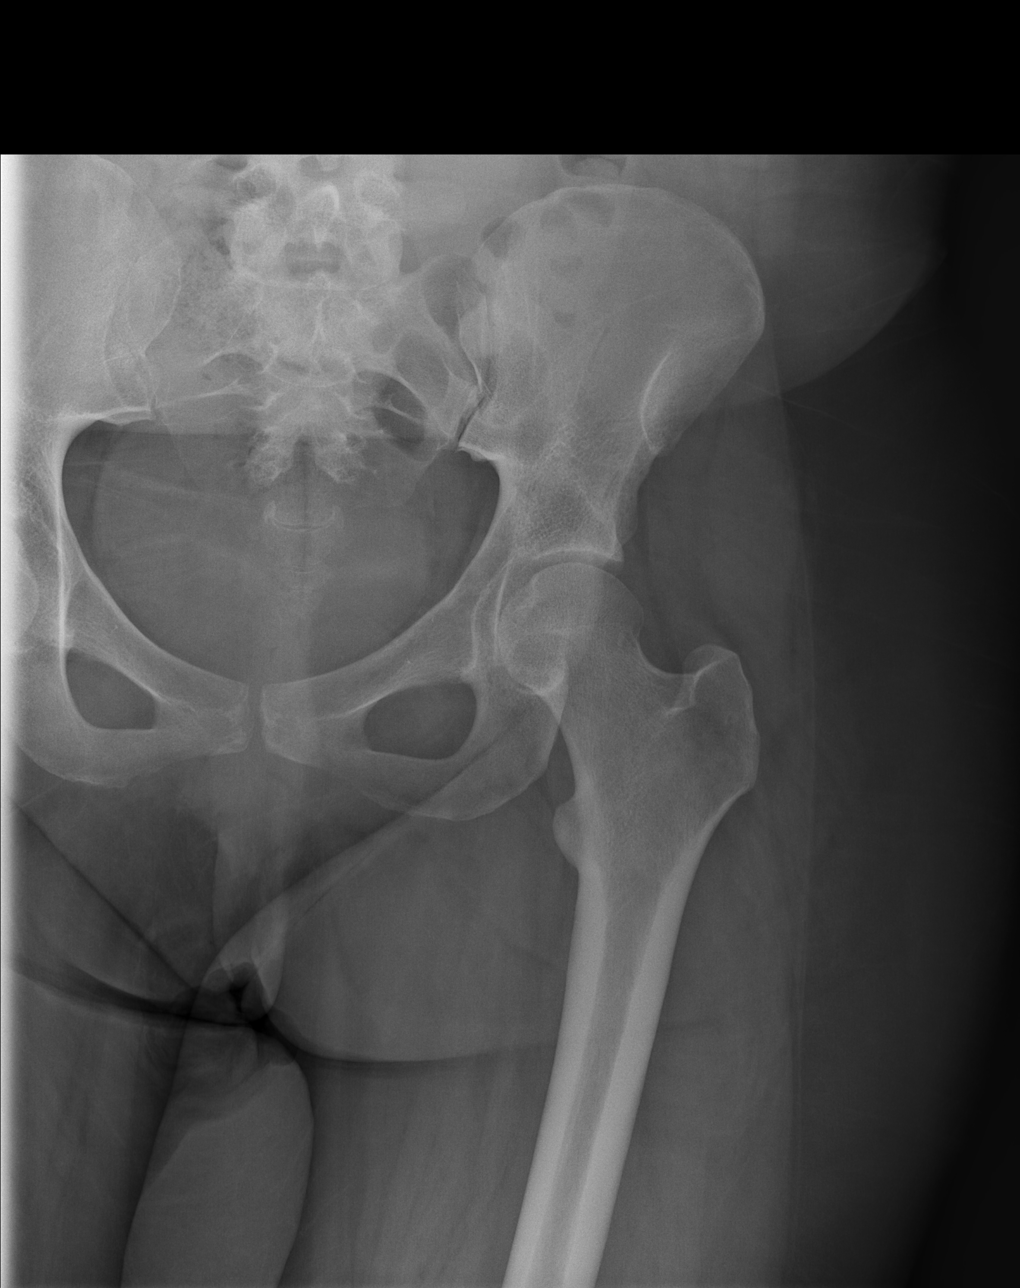

[t hip frog leg left (1 of 2)]
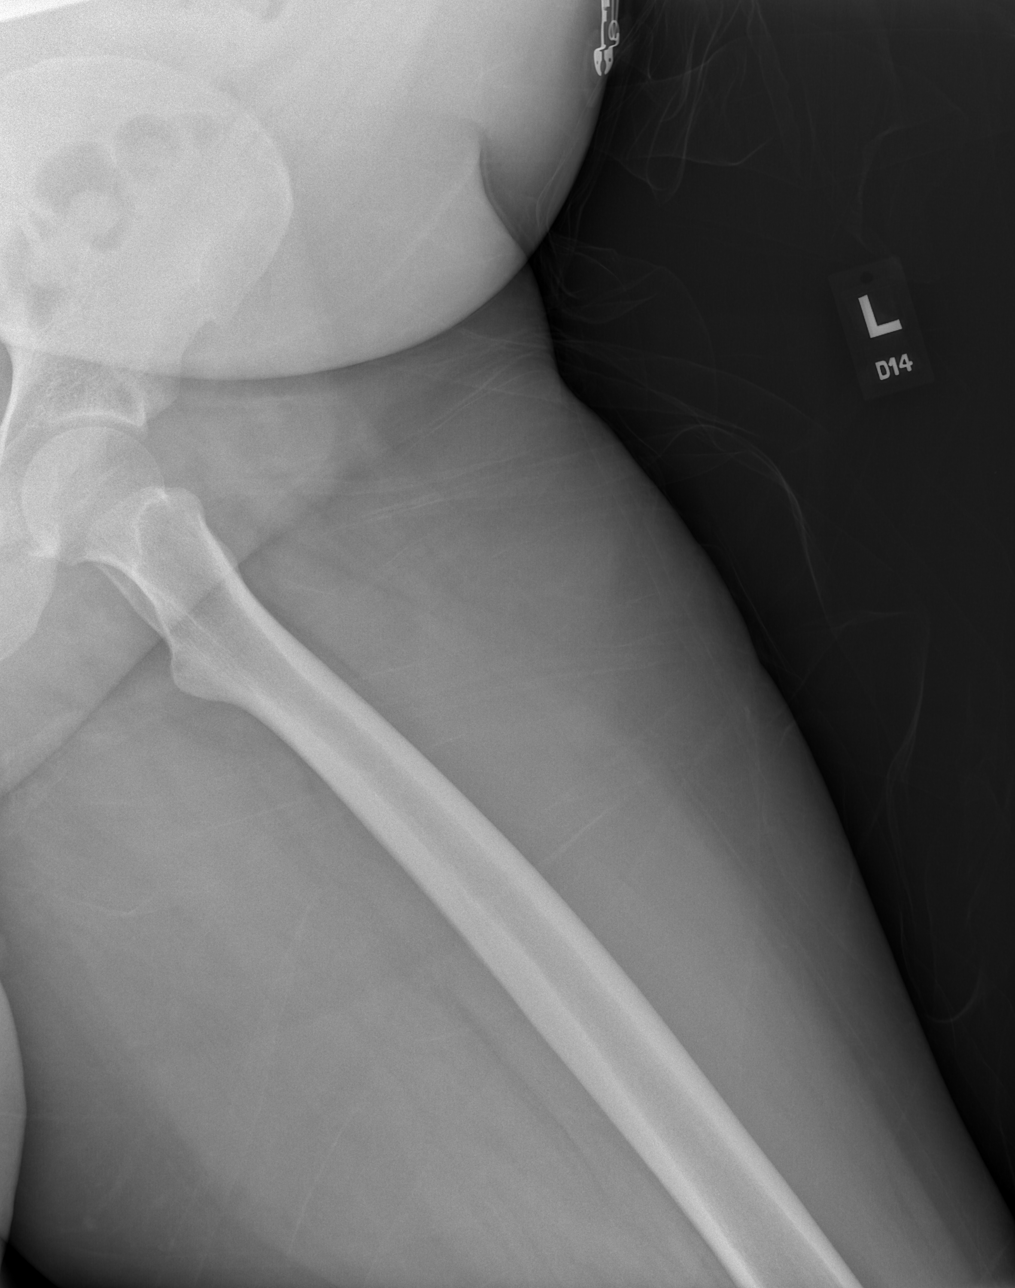

[t hip frog leg left (2 of 2)]
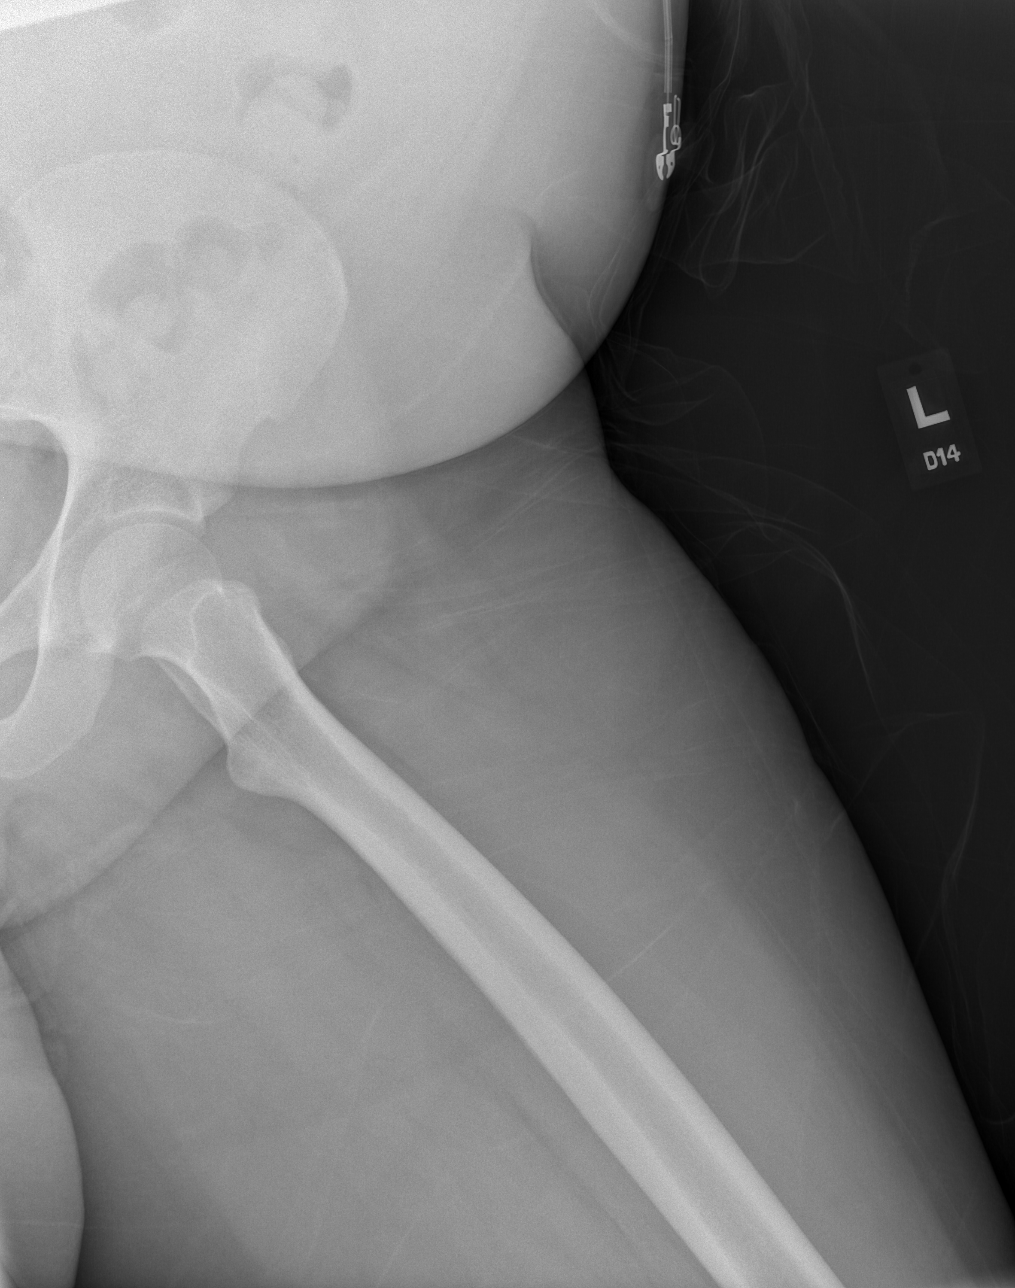

[4 of 4 positions shown; findings below may reference images not displayed]

FINDINGS: There is no evidence of hip fracture or dislocation. There is no
evidence of arthropathy or other focal bone abnormality.
IMPRESSION: Negative.

## 2020-10-18 IMAGING — CR DG CHEST 2V
2 series · 2 of 2 positions shown · non-contrast
Comparison: August 23, 2019.

CLINICAL DATA: Seizures.

EXAM:
CHEST - 2 VIEW

[w chest pa]
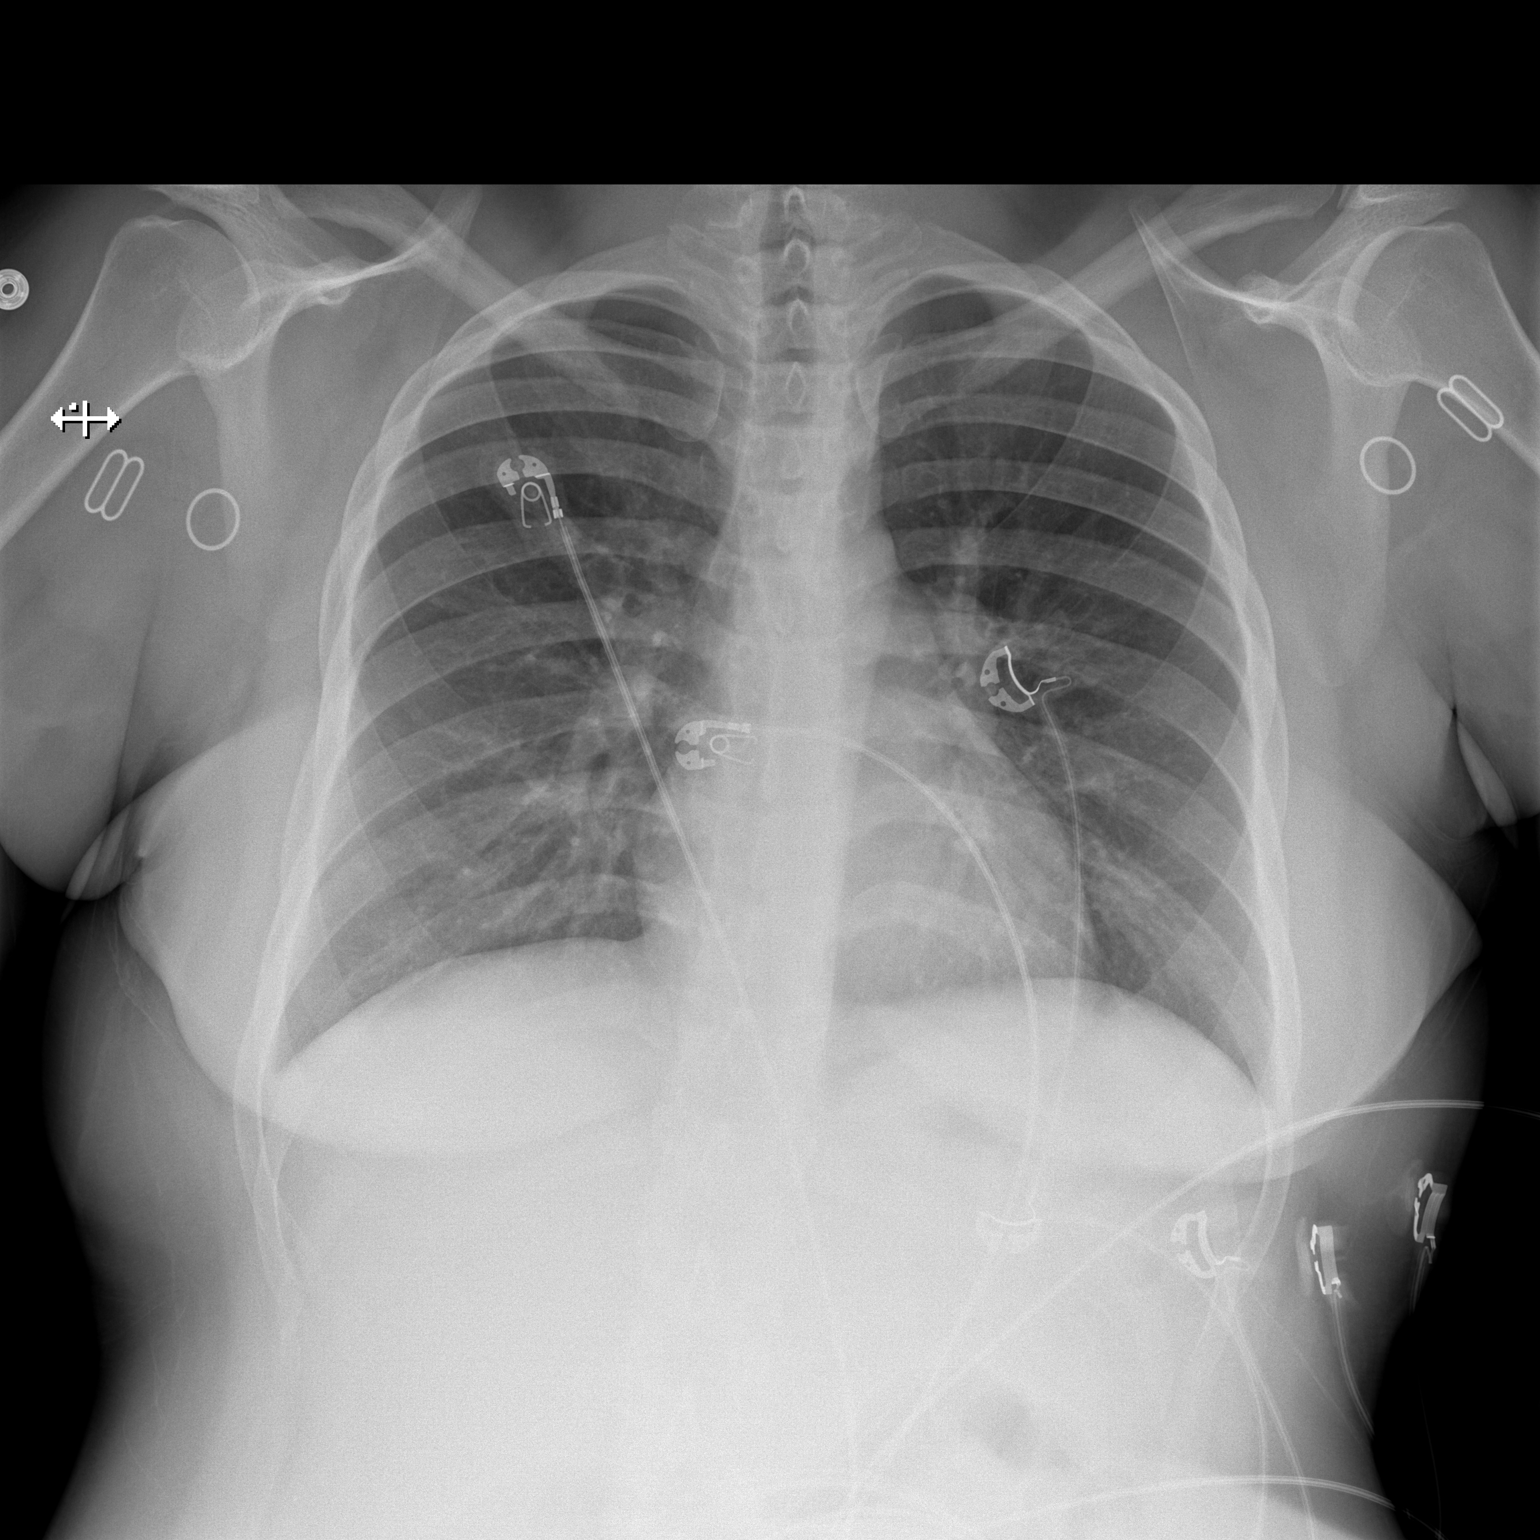

[w chest lat]
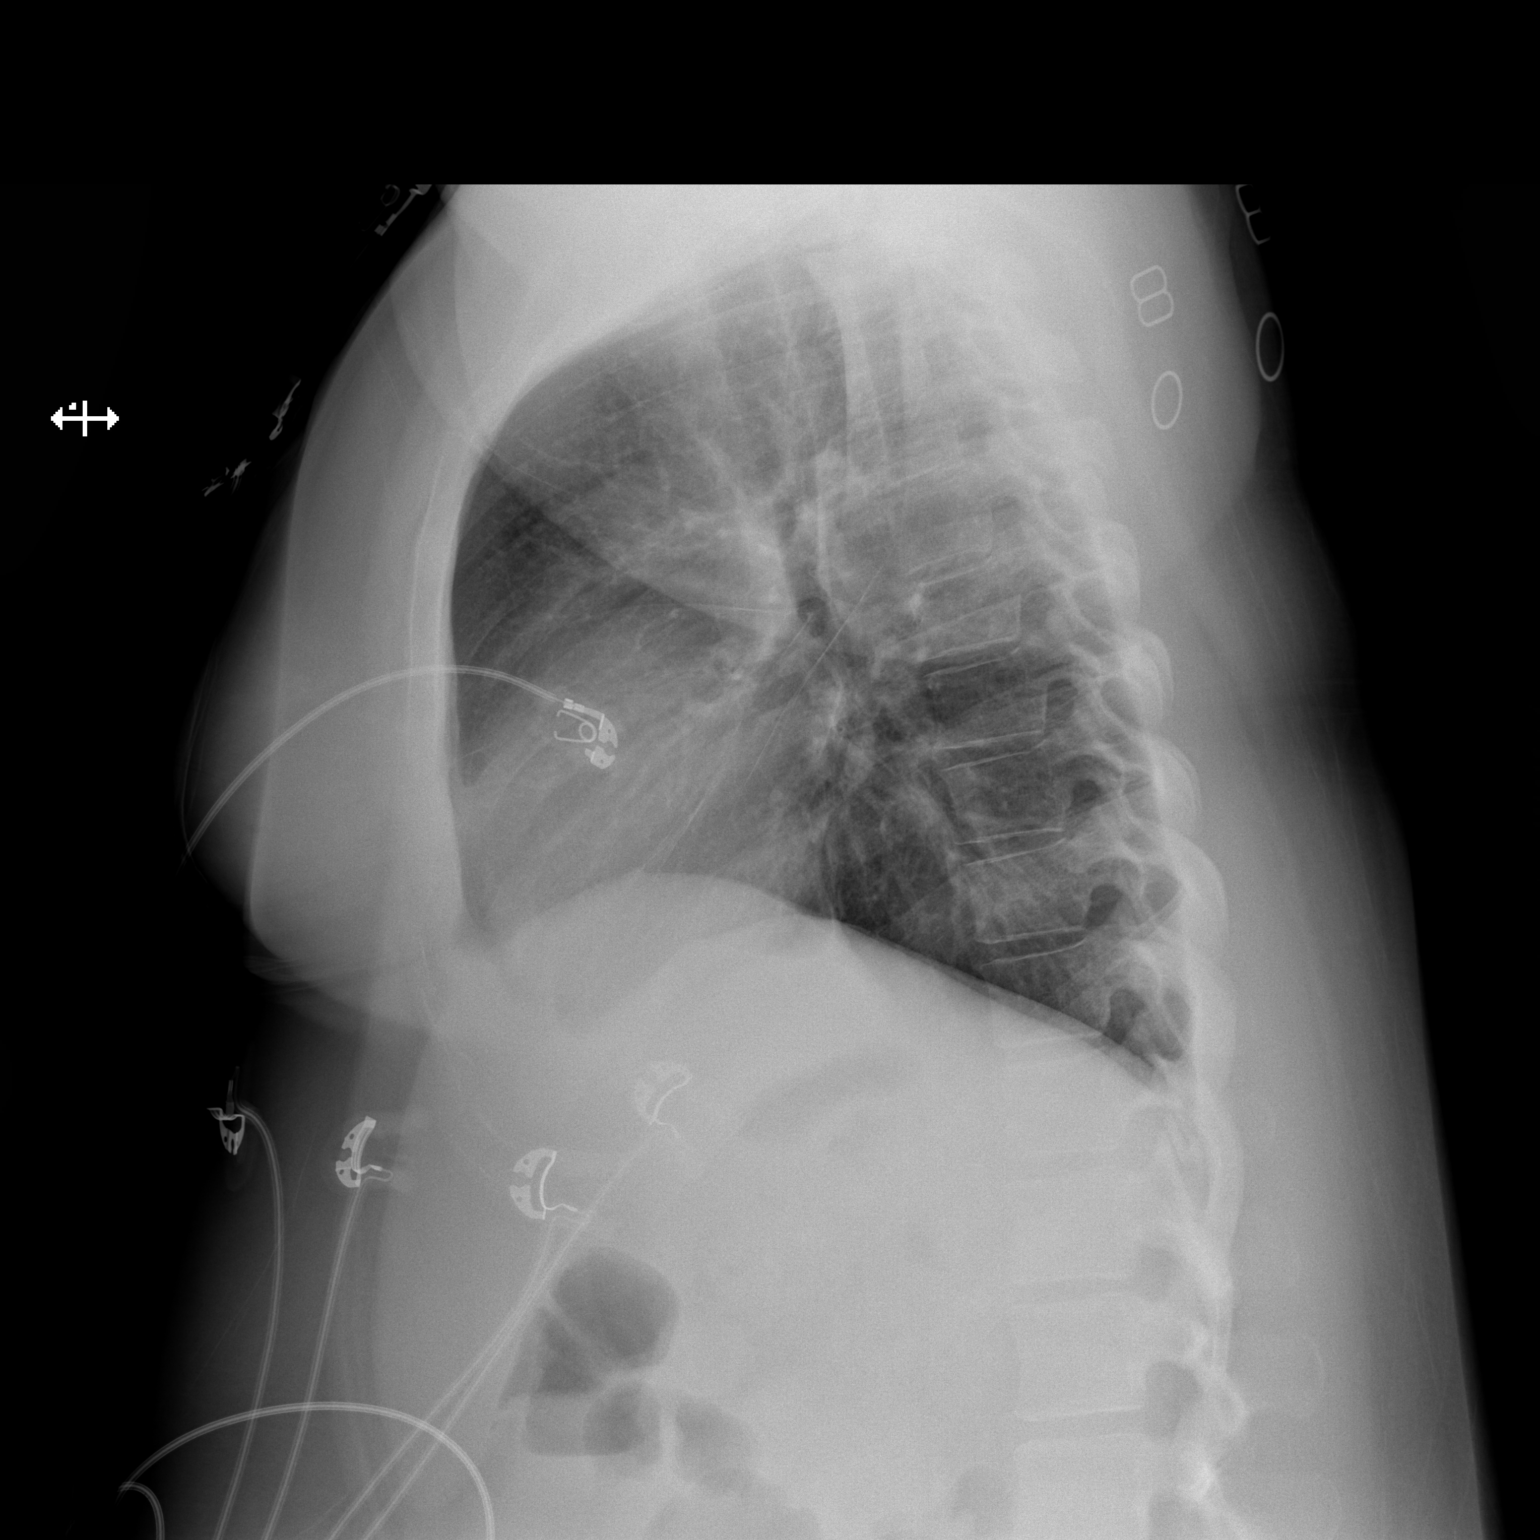

[2 of 2 positions shown; findings below may reference images not displayed]

FINDINGS: The heart size and mediastinal contours are within normal limits.
Both lungs are clear. No pneumothorax or pleural effusion is noted.
The visualized skeletal structures are unremarkable.
IMPRESSION: No active cardiopulmonary disease.

## 2020-10-18 IMAGING — CR DG FEMUR 2+V*L*
4 series · 4 of 4 positions shown · non-contrast
Comparison: None.

CLINICAL DATA: Seizures.

EXAM:
LEFT FEMUR 2 VIEWS

[t hip ap left]
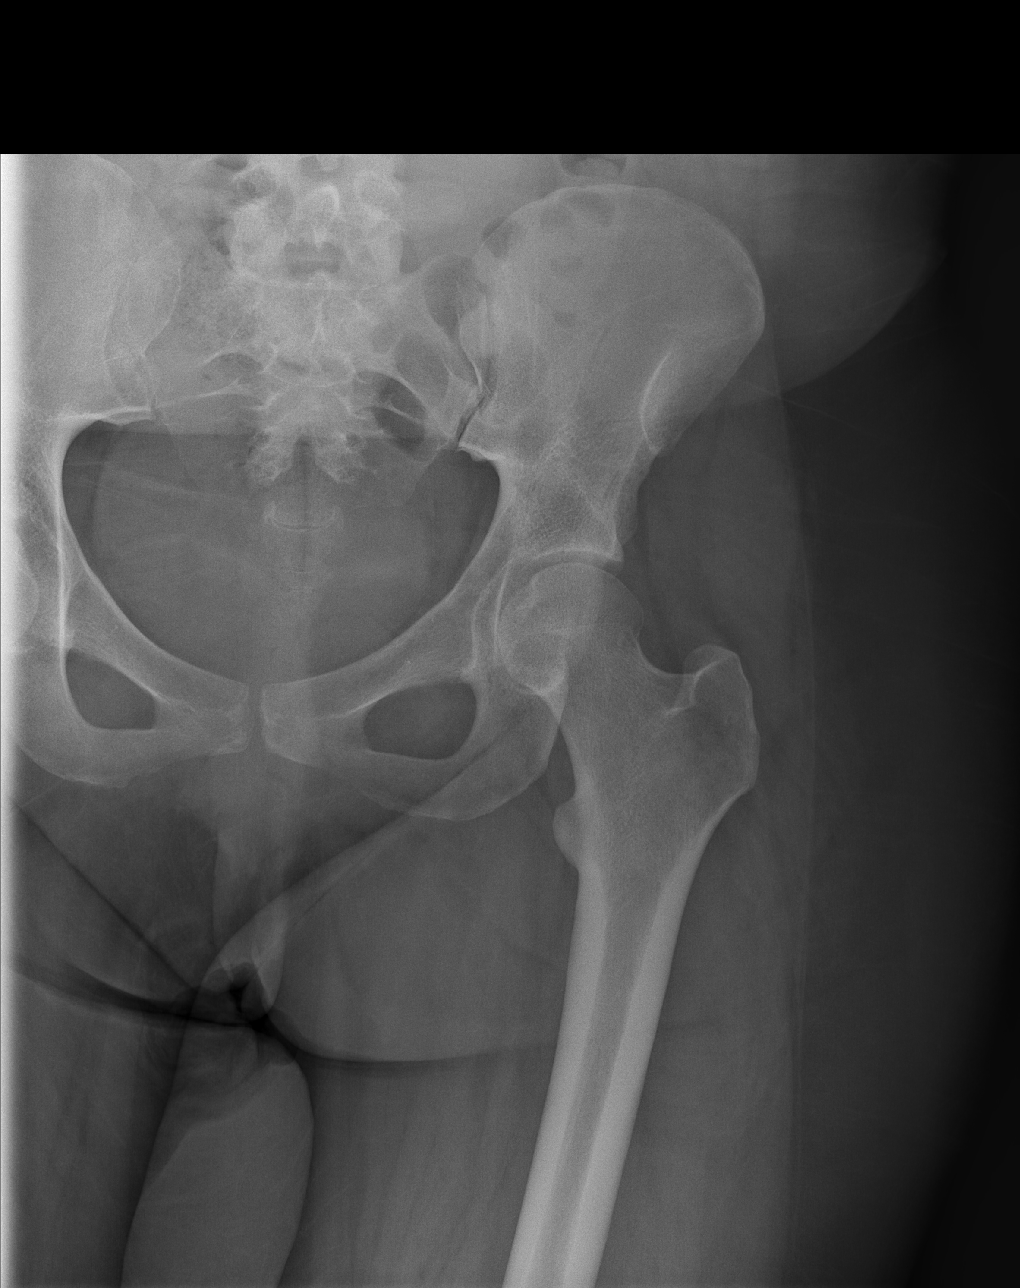

[t femur distal ap left]
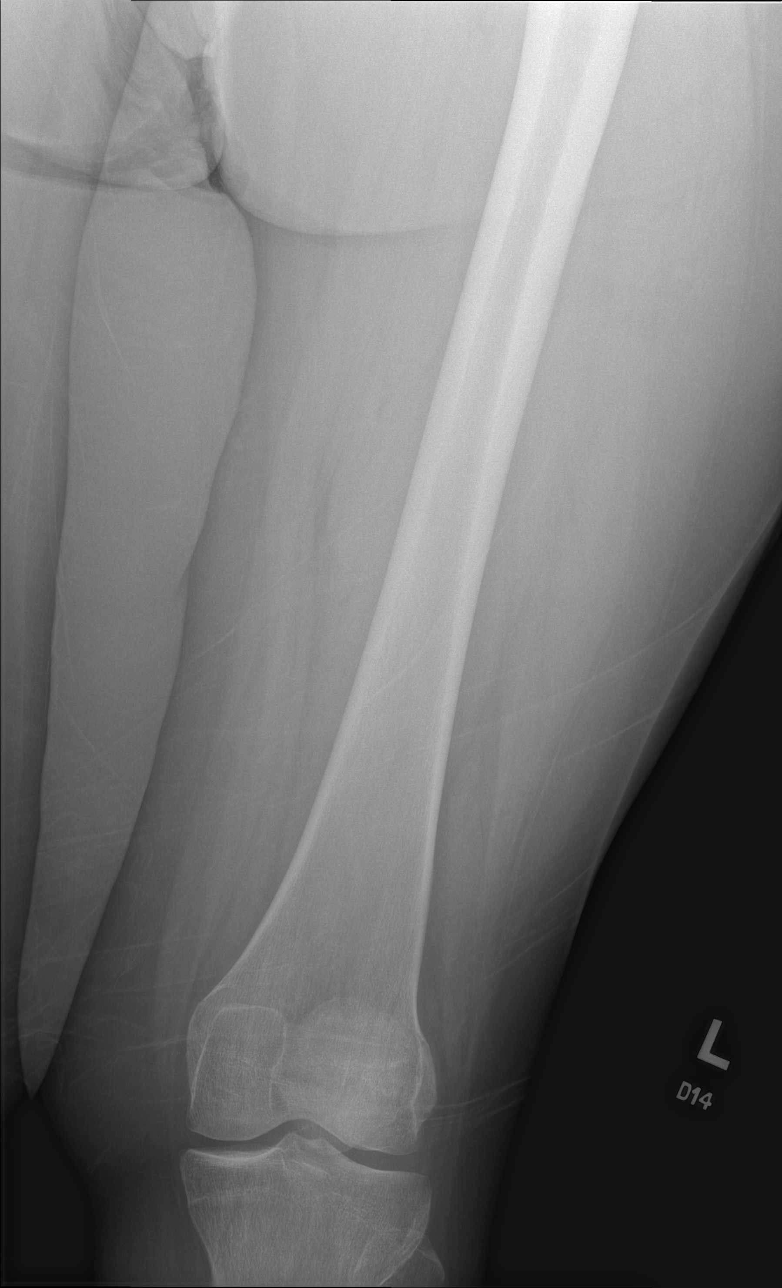

[t femur distal lat left]
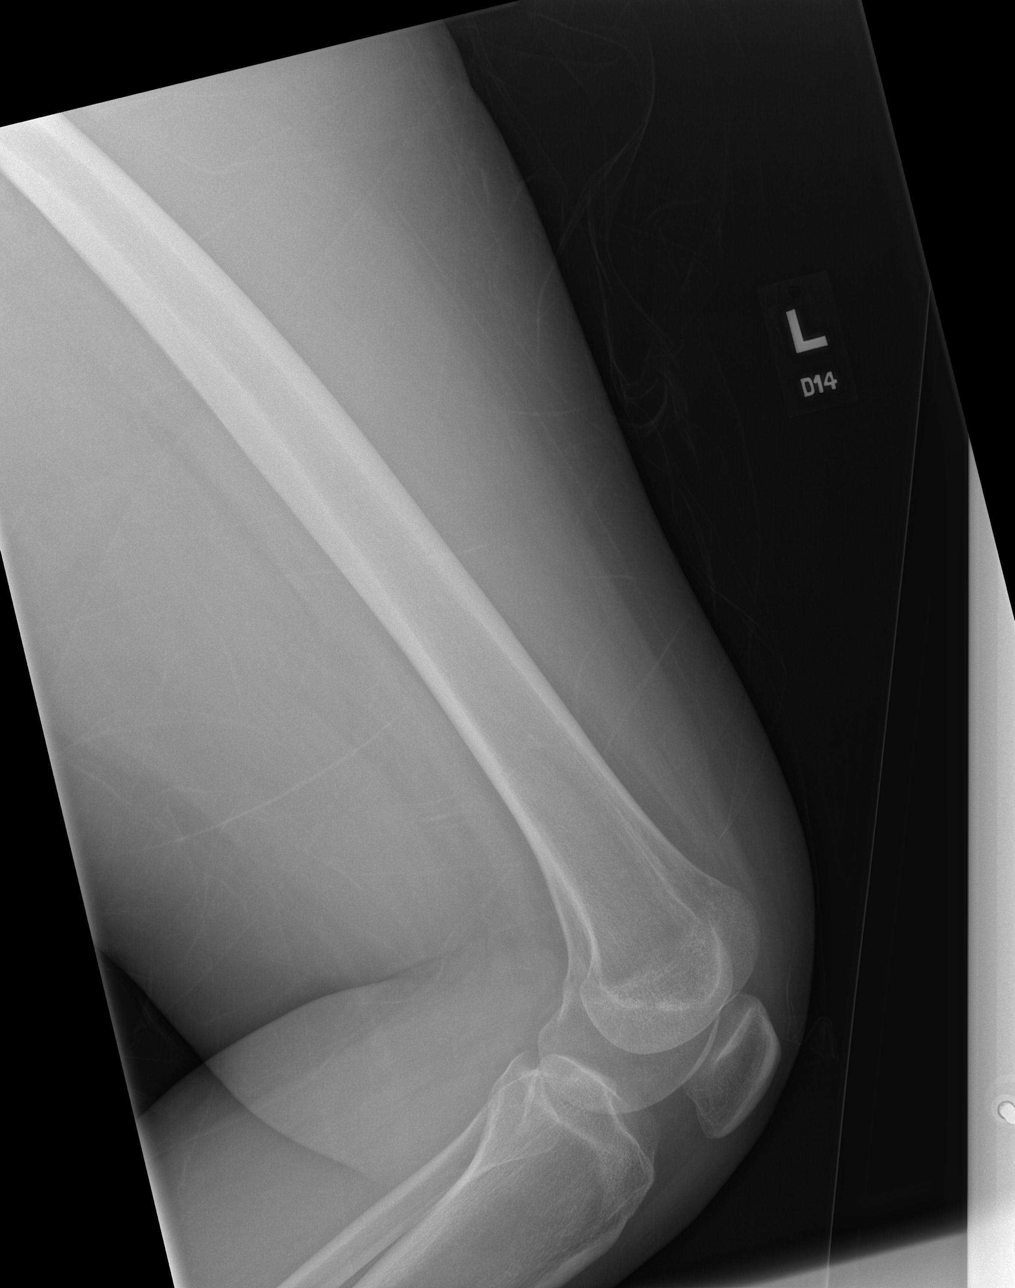

[t hip frog leg left]
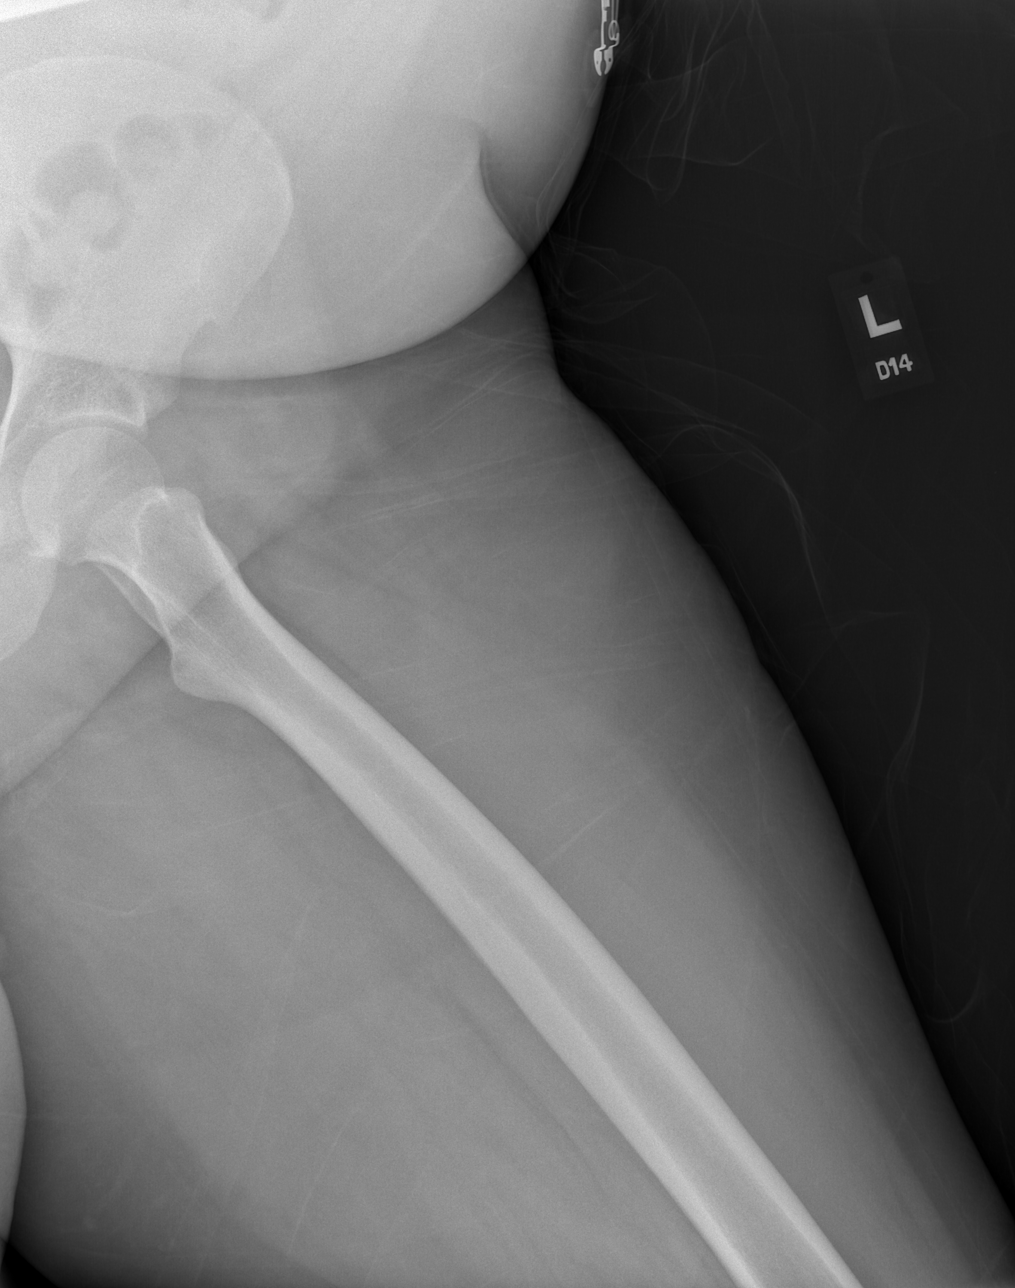

[4 of 4 positions shown; findings below may reference images not displayed]

FINDINGS: There is no evidence of fracture or other focal bone lesions. Soft
tissues are unremarkable.
IMPRESSION: Negative.

## 2020-10-18 IMAGING — CR DG MANDIBLE 4+V
4 series · 4 of 4 positions shown · non-contrast
Comparison: None.

CLINICAL DATA: Seizures, fall.

EXAM:
MANDIBLE - 4+ VIEW

[w mandible obl (1 of 2)]
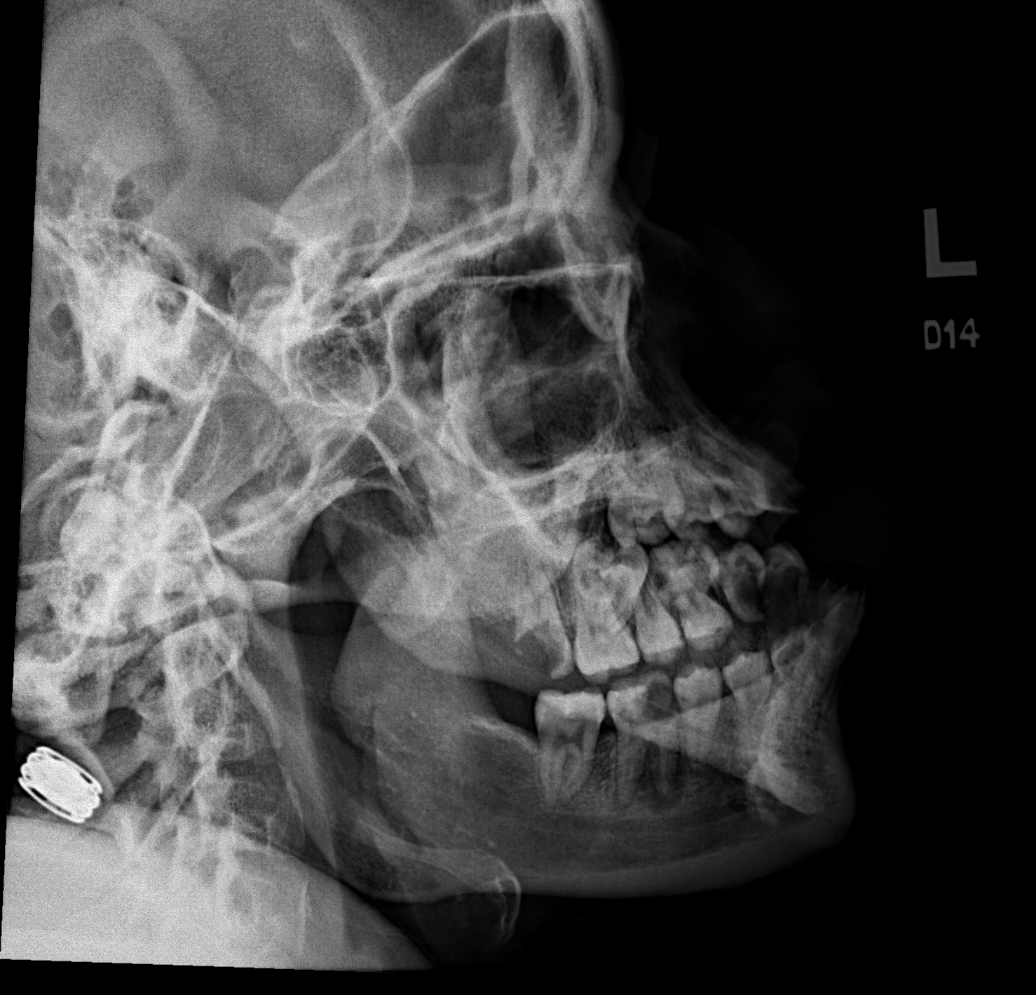

[w mandible pa]
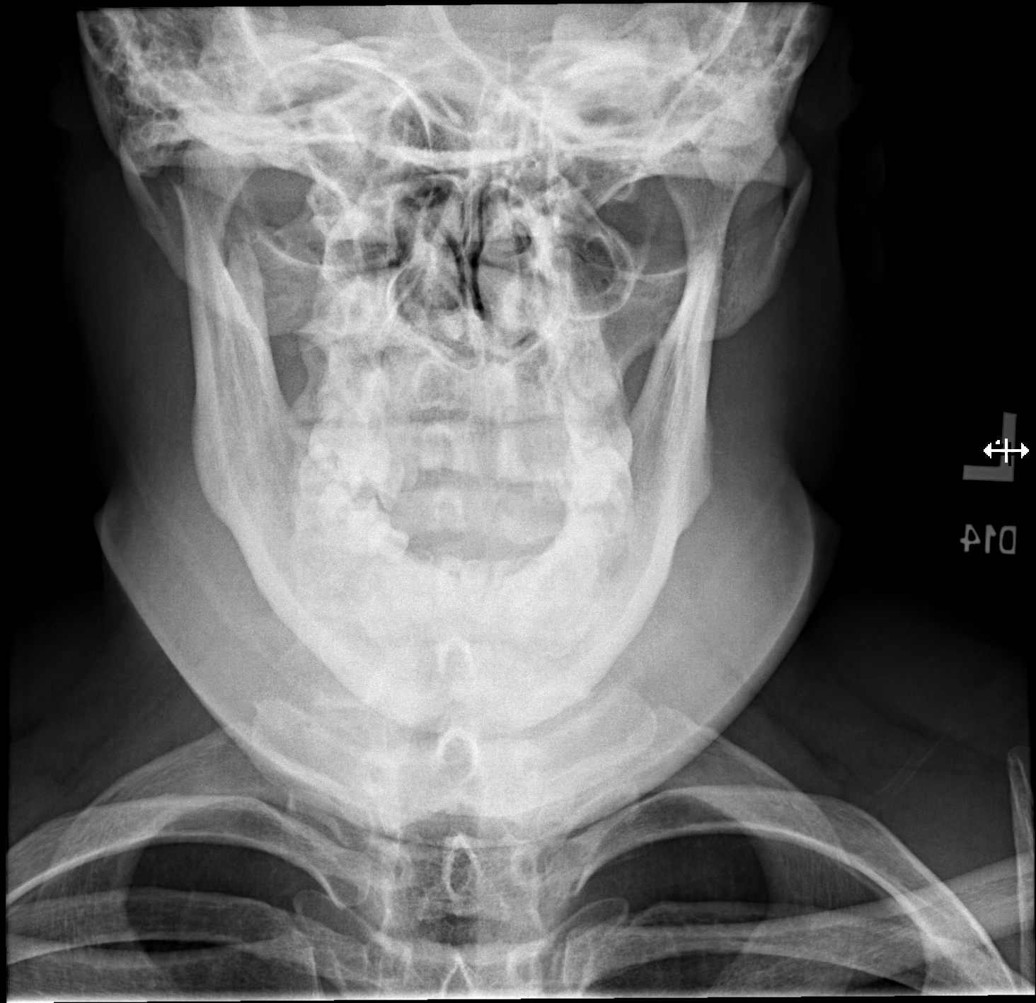

[w mandible obl (2 of 2)]
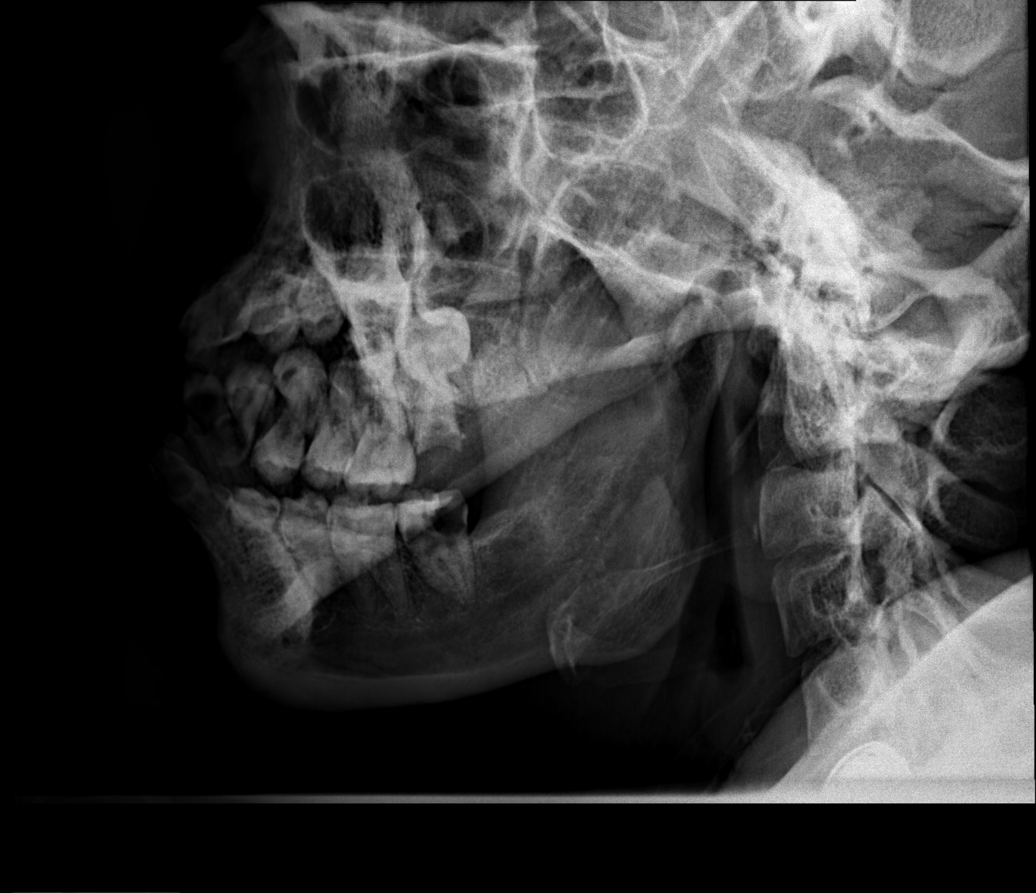

[[person_name]]
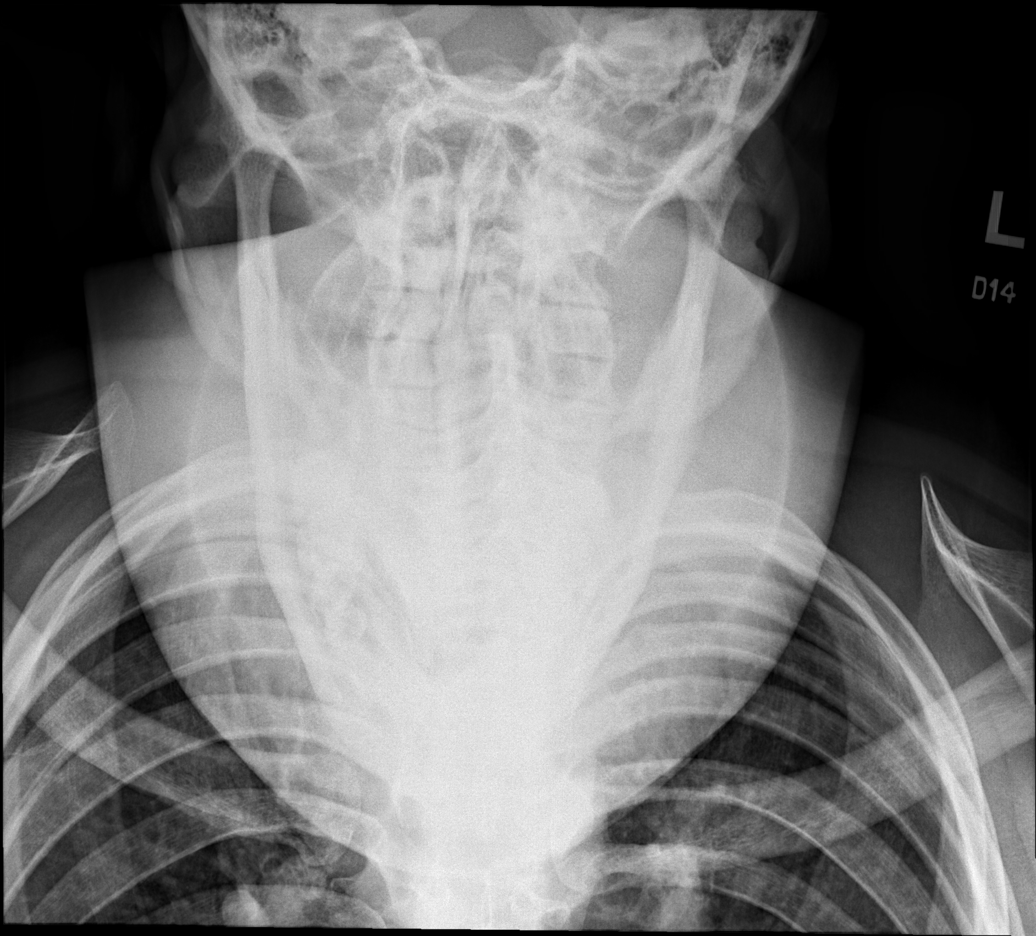

[4 of 4 positions shown; findings below may reference images not displayed]

FINDINGS: There is no evidence of fracture or other focal bone lesions.
IMPRESSION: Negative.

## 2020-10-29 ENCOUNTER — Encounter (HOSPITAL_COMMUNITY): Payer: Self-pay

## 2020-10-29 ENCOUNTER — Other Ambulatory Visit: Payer: Self-pay

## 2020-10-29 ENCOUNTER — Emergency Department (HOSPITAL_COMMUNITY)
Admission: EM | Admit: 2020-10-29 | Discharge: 2020-10-30 | Disposition: A | Discharge status: Home / Self Care | Payer: Medicaid Other | Attending: Student | Admitting: Student

## 2020-10-29 DIAGNOSIS — Z5321 Procedure and treatment not carried out due to patient leaving prior to being seen by health care provider: Secondary | ICD-10-CM | POA: Insufficient documentation

## 2020-10-29 DIAGNOSIS — R569 Unspecified convulsions: Secondary | ICD-10-CM | POA: Diagnosis not present

## 2020-10-29 DIAGNOSIS — R1110 Vomiting, unspecified: Secondary | ICD-10-CM | POA: Insufficient documentation

## 2020-10-29 MED ORDER — ONDANSETRON 4 MG PO TBDP
4.0000 mg | ORAL_TABLET | Freq: Once | ORAL | Status: AC
  Administered 2020-10-29: 4 mg via ORAL
  Filled 2020-10-29: qty 1

## 2020-10-29 NOTE — ED Notes (Signed)
Patient refused lab draw. Triage RN made aware. 

## 2020-10-29 NOTE — ED Triage Notes (Signed)
Pt BIB EMS from work. EMS was called for possible seizure. Pt reports taking seizure medication. Upon EMS arrival pt was A&Ox4. Pt seen shaking in the stretcher while being A&O x4. Pt endorses emesis.

## 2020-10-29 NOTE — ED Provider Notes (Signed)
Emergency Medicine Provider Triage Evaluation Note  Nancy [name] , a 20 y.o. female  was evaluated in triage.  Pt complains of numerous episodes of nonbloody, nonbilious emesis for the past few days.  Patient also admits to having a seizure earlier today.  Has a history of seizure disorder.  Has been compliant with her medications.  Patient admits to having a seizure every other day.  Follows neurology.  Nausea and vomiting associated with generalized abdominal pain.  No urinary or vaginal symptoms.  No fever or chills.  Review of Systems  Positive: Nausea. Vomiting, seizure Negative: fever  Physical Exam  There were no vitals taken for this visit. Gen:   Awake, no distress   Resp:  Normal effort  MSK:   Moves extremities without difficulty  Other:  Generalized abdominal tenderness  Medical Decision Making  Medically screening exam initiated at 4:30 PM.  Appropriate orders placed.  Nancy [name] was informed that the remainder of the evaluation will be completed by another provider, this initial triage assessment does not replace that evaluation, and the importance of remaining in the ED until their evaluation is complete.  Abdominal labs Zofran given in triage   Jesusita Oka 10/29/20 1632    Eber Hong, MD 10/29/20 2025

## 2020-10-31 ENCOUNTER — Encounter: Payer: Self-pay | Admitting: Emergency Medicine

## 2020-10-31 ENCOUNTER — Emergency Department: Payer: Medicaid Other

## 2020-10-31 ENCOUNTER — Other Ambulatory Visit: Payer: Self-pay

## 2020-10-31 DIAGNOSIS — E876 Hypokalemia: Secondary | ICD-10-CM | POA: Diagnosis not present

## 2020-10-31 DIAGNOSIS — Z79899 Other long term (current) drug therapy: Secondary | ICD-10-CM | POA: Insufficient documentation

## 2020-10-31 DIAGNOSIS — R112 Nausea with vomiting, unspecified: Secondary | ICD-10-CM | POA: Diagnosis present

## 2020-10-31 DIAGNOSIS — Z794 Long term (current) use of insulin: Secondary | ICD-10-CM | POA: Diagnosis not present

## 2020-10-31 DIAGNOSIS — R569 Unspecified convulsions: Secondary | ICD-10-CM | POA: Diagnosis not present

## 2020-10-31 DIAGNOSIS — I509 Heart failure, unspecified: Secondary | ICD-10-CM | POA: Diagnosis not present

## 2020-10-31 DIAGNOSIS — R1084 Generalized abdominal pain: Secondary | ICD-10-CM | POA: Insufficient documentation

## 2020-10-31 DIAGNOSIS — R079 Chest pain, unspecified: Secondary | ICD-10-CM | POA: Insufficient documentation

## 2020-10-31 DIAGNOSIS — E109 Type 1 diabetes mellitus without complications: Secondary | ICD-10-CM | POA: Diagnosis not present

## 2020-10-31 DIAGNOSIS — Z20822 Contact with and (suspected) exposure to covid-19: Secondary | ICD-10-CM | POA: Insufficient documentation

## 2020-10-31 DIAGNOSIS — I110 Hypertensive heart disease with heart failure: Secondary | ICD-10-CM | POA: Diagnosis not present

## 2020-10-31 LAB — COMPREHENSIVE METABOLIC PANEL
ALT: 17 U/L (ref 0–44)
AST: 17 U/L (ref 15–41)
Albumin: 4.3 g/dL (ref 3.5–5.0)
Alkaline Phosphatase: 78 U/L (ref 38–126)
Anion gap: 11 (ref 5–15)
BUN: 11 mg/dL (ref 6–20)
CO2: 26 mmol/L (ref 22–32)
Calcium: 9.4 mg/dL (ref 8.9–10.3)
Chloride: 100 mmol/L (ref 98–111)
Creatinine, Ser: 0.61 mg/dL (ref 0.44–1.00)
GFR, Estimated: >60 mL/min (ref >60)
Glucose, Bld: 185 mg/dL — ABNORMAL HIGH (ref 70–99)
Potassium: 3.3 mmol/L — ABNORMAL LOW (ref 3.5–5.1)
Sodium: 137 mmol/L (ref 135–145)
Total Bilirubin: 1.2 mg/dL (ref 0.3–1.2)
Total Protein: 8.6 g/dL — ABNORMAL HIGH (ref 6.5–8.1)

## 2020-10-31 LAB — RESP PANEL BY RT-PCR (FLU A&B, COVID) ARPGX2
Influenza A by PCR: NEGATIVE
Influenza B by PCR: NEGATIVE
SARS Coronavirus 2 by RT PCR: NEGATIVE

## 2020-10-31 LAB — TROPONIN I (HIGH SENSITIVITY)
Troponin I (High Sensitivity): <2 ng/L (ref <18)
Troponin I (High Sensitivity): <2 ng/L (ref <18)

## 2020-10-31 LAB — CBC
HCT: 38.5 % (ref 36.0–46.0)
Hemoglobin: 13 g/dL (ref 12.0–15.0)
MCH: 25.8 pg — ABNORMAL LOW (ref 26.0–34.0)
MCHC: 33.8 g/dL (ref 30.0–36.0)
MCV: 76.5 fL — ABNORMAL LOW (ref 80.0–100.0)
Platelets: 468 10*3/uL — ABNORMAL HIGH (ref 150–400)
RBC: 5.03 MIL/uL (ref 3.87–5.11)
RDW: 15.6 % — ABNORMAL HIGH (ref 11.5–15.5)
WBC: 10.4 10*3/uL (ref 4.0–10.5)
nRBC: 0 % (ref 0.0–0.2)

## 2020-10-31 LAB — LIPASE, BLOOD: Lipase: 29 U/L (ref 11–51)

## 2020-10-31 MED ORDER — ACETAMINOPHEN 325 MG PO TABS
650.0000 mg | ORAL_TABLET | Freq: Once | ORAL | Status: AC | PRN
  Administered 2020-10-31: 650 mg via ORAL
  Filled 2020-10-31: qty 2

## 2020-10-31 MED ORDER — ONDANSETRON 4 MG PO TBDP
4.0000 mg | ORAL_TABLET | Freq: Once | ORAL | Status: AC | PRN
  Administered 2020-10-31: 4 mg via ORAL
  Filled 2020-10-31: qty 1

## 2020-10-31 NOTE — ED Triage Notes (Signed)
[facility], [facility] and [facility], pt states she was at [facility] and [facility] 2 days ago for same thing. PT did have 300cc emesis in emesis bag.  Pt states she has had multiple seizures today, states she has had some since arrival while waiting in the lobby. Pt is not currently post-ictal, she is alert and oriented, speaking in complete sentences no slurred speech, rocking back in forth in triage moaning.  Pt is ambulatory without difficulty, states she takes Lamictal for seizures and states she is unsure if it has stayed down.

## 2020-10-31 NOTE — ED Provider Notes (Signed)
Emergency Medicine Provider Triage Evaluation Note  Nancy [name] , a 20 y.o. female  was evaluated in triage.  Pt complains of multiple medical complaints, consisting of abdominal pain, chest pain, cough, multiple seizures as well as vomiting, diarrhea, fevers chills and body aches.  Review of Systems  Positive: Chest pain abdominal pain nausea vomiting diarrhea, seizures, fevers, chills, body aches, cough Negative: Rash, urinary symptoms  Physical Exam  BP 100/67 (BP Location: Left Arm)   Pulse (!) 123   Temp 98.2 F (36.8 C) (Oral)   Resp 18   Ht 5\' 3"  (1.6 m)   Wt 83.9 kg   LMP 10/03/2020 (Within Days)   SpO2 100%   BMI 32.77 kg/m  Gen:   Awake, moderate distress.  Alert and oriented x3 rocking back and forth.  Patient calms down in conversation and stops writhing in pain Resp:  Normal effort, no respiratory distress MSK:   Moves extremities without difficulty  Other:  Abdomen soft nontender nondistended  Medical Decision Making  Medically screening exam initiated at 8:16 PM.  Appropriate orders placed.  10/05/2020 was informed that the remainder of the evaluation will be completed by another provider, this initial triage assessment does not replace that evaluation, and the importance of remaining in the ED until their evaluation is complete.  Will order blood work, urine, basic blood work along with urinalysis obtained.  Flu/COVID test ordered along with EKG, chest x-ray.   Nancy [name] 10/31/20 2036    2037, MD 10/31/20 2142

## 2020-10-31 NOTE — ED Notes (Signed)
Pt in triage room 2, this RN went to get a covid swab in the main ED area when I returned pt was on the floor with other staff members. It was described that a loud "thump" was heard and pt was on the floor in what appeared to be seizure-like activity, however, pt was able to follow some commands, pt was able to stand up and get to stretcher, pt alert and oriented not post-ictal, Cranston Neighbor PA-C in room as well as Jaquelyn Bitter RN. Pt placed on stretcher, asked for additional blankets and was placed in the main triage area.

## 2020-10-31 NOTE — ED Triage Notes (Signed)
Pt comes into the ED via EMS from home with c/o having a seizure yesterday went to [facility] and [facility], and had 2 more seizures today   115HR 166/111 98%RA [id number] 99.0 temp

## 2020-11-01 ENCOUNTER — Emergency Department
Admission: EM | Admit: 2020-11-01 | Discharge: 2020-11-01 | Disposition: A | Discharge status: Home / Self Care | Payer: Medicaid Other | Attending: Student in an Organized Health Care Education/Training Program | Admitting: Student in an Organized Health Care Education/Training Program

## 2020-11-01 DIAGNOSIS — R112 Nausea with vomiting, unspecified: Secondary | ICD-10-CM

## 2020-11-01 DIAGNOSIS — R569 Unspecified convulsions: Secondary | ICD-10-CM

## 2020-11-01 MED ORDER — LACTATED RINGERS IV BOLUS
1000.0000 mL | Freq: Once | INTRAVENOUS | Status: AC
  Administered 2020-11-01: 1000 mL via INTRAVENOUS

## 2020-11-01 MED ORDER — DROPERIDOL 2.5 MG/ML IJ SOLN
2.5000 mg | Freq: Once | INTRAMUSCULAR | Status: AC
  Administered 2020-11-01: 2.5 mg via INTRAVENOUS
  Filled 2020-11-01: qty 2

## 2020-11-01 NOTE — ED Provider Notes (Signed)
[facility] Emergency Department Provider Note  ____________________________________________   Event Date/Time   First MD Initiated Contact with Patient 11/01/20 0301     (approximate)  I have reviewed the triage vital signs and the nursing notes.   HISTORY  Chief Complaint Chest Pain, Abdominal Pain, and Seizures    HPI Nancy [name] is a 20 y.o. 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  She presents tonight for evaluation of 2 days of persistent nausea and vomiting.  She said that as a result of the nausea and vomiting, which feels similar to prior episodes, she has been having "lots of seizures".  She said that she usually takes Lamictal but she does not think she is keeping it down.  She said that she has a GI doctor who does not know why she keeps having these problems with her nausea and vomiting and abdominal pain, and she also has a neurologist who cannot figure out why she is having the seizures, although she states that her doctor thinks they might be "stress-related".  The patient said her symptoms are severe and feel like they have in the past.  She denies fever, sore throat, and shortness of breath.  She feels like the pain in her abdomen also goes up into her chest.  She denies alcohol use.  She has a history of marijuana use.  Nothing in particular makes her symptoms better or worse.     Past Medical History:  Diagnosis Date   Acanthosis nigricans    Anxiety    CHF (congestive heart failure) (HCC)    Chronic lower back pain    Depression    DKA, type 1 (HCC) 09/13/2018   Dyspepsia    Obesity    Ovarian cyst    pt is not aware of this hx (11/24/2017)   Precocious adrenarche (HCC)    Premature baby    Seizures (HCC)    Type II diabetes mellitus (HCC)    insulin dependant    Patient Active Problem List   Diagnosis Date Noted   Abdominal pain 02/22/2020   Chronic  constipation 02/22/2020   Nausea & vomiting 02/22/2020   SIRS (systemic inflammatory response syndrome) (HCC) 01/14/2020   Diabetic gastroparesis (HCC) 11/25/2019   Polysubstance abuse (HCC) 11/25/2019   Gastritis 11/25/2019   DKA, type 1 (HCC) 11/24/2019   Cyclical vomiting with nausea 10/01/2019   Intractable nausea and vomiting 09/30/2019   Cellulitis    DKA (diabetic ketoacidoses) 01/27/2019   Leukocytosis 12/19/2018   Dehydration    Pseudoseizures (HCC)    Type 1 diabetes mellitus with ketoacidosis without coma (HCC) 11/28/2018   Hyperglycemia 11/27/2018   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   MDD (major depressive disorder), recurrent episode, severe (HCC) 11/06/2018   Nonspecific abnormal electrocardiogram (ECG) (EKG)    Chest pain of uncertain etiology    Hypertensive urgency 10/08/2018   Conversion disorder with attacks or seizures, acute episode, with psychological stressor 09/16/2018   MDD (major depressive disorder), recurrent severe, without psychosis (HCC)    AKI (acute kidney injury) (HCC) 08/04/2018   Seizure-like activity (HCC) 08/03/2018   Depression 07/25/2018   Syncope 01/30/2018   Orthostatic hypotension 01/24/2018   Tachycardia 12/28/2017   Chronic abdominal pain 12/24/2017   Chest pain 12/19/2017   Nausea and vomiting 08/21/2017   Generalized abdominal pain 08/21/2017   Non compliance with medical treatment 01/27/2012   Adjustment disorder  09/16/2011   Acanthosis nigricans    Goiter    Obesity 06/14/2010   Hypertension 06/14/2010    Past Surgical History:  Procedure Laterality Date   ABDOMINAL HERNIA REPAIR     "I was a baby"   BIOPSY  10/12/2018   Procedure: BIOPSY;  Surgeon: Lynann Bologna, MD;  Location: [facility] ENDOSCOPY;  Service: Endoscopy;;   BIOPSY  02/28/2020   Procedure: BIOPSY;  Surgeon: Shellia Cleverly, DO;  Location: [facility] ENDOSCOPY;  Service: Gastroenterology;;   ESOPHAGOGASTRODUODENOSCOPY (EGD)  WITH PROPOFOL N/A 10/12/2018   Procedure: ESOPHAGOGASTRODUODENOSCOPY (EGD) WITH PROPOFOL;  Surgeon: Lynann Bologna, MD;  Location: [facility] ENDOSCOPY;  Service: Endoscopy;  Laterality: N/A;   ESOPHAGOGASTRODUODENOSCOPY (EGD) WITH PROPOFOL N/A 02/28/2020   Procedure: ESOPHAGOGASTRODUODENOSCOPY (EGD) WITH PROPOFOL;  Surgeon: Shellia Cleverly, DO;  Location: [facility] ENDOSCOPY;  Service: Gastroenterology;  Laterality: N/A;   FLEXIBLE SIGMOIDOSCOPY N/A 02/28/2020   Procedure: FLEXIBLE SIGMOIDOSCOPY;  Surgeon: Shellia Cleverly, DO;  Location: [facility] ENDOSCOPY;  Service: Gastroenterology;  Laterality: N/A;   HERNIA REPAIR     LEFT HEART CATH AND CORONARY ANGIOGRAPHY N/A 10/13/2018   Procedure: LEFT HEART CATH AND CORONARY ANGIOGRAPHY;  Surgeon: Kathleene Hazel, MD;  Location: [facility] INVASIVE CV LAB;  Service: Cardiovascular;  Laterality: N/A;   TONSILLECTOMY AND ADENOIDECTOMY     WISDOM TOOTH EXTRACTION  2017    Prior to Admission medications   Medication Sig Start Date End Date Taking? Authorizing Provider  ARIPiprazole (ABILIFY) 10 MG tablet Take 10 mg by mouth in the morning and at bedtime.     [provider]  atorvastatin (LIPITOR) 20 MG tablet Take 1 tablet (20 mg total) by mouth daily at 6 PM. Patient taking differently: Take 20 mg by mouth daily. 10/05/18   Storm Frisk, MD  capsaicin (ZOSTRIX) 0.025 % cream Apply topically 2 (two) times daily. 02/29/20   Ghimire, Werner Lean, MD  capsaicin (ZOSTRIX) 0.025 % cream APPLY TOPICALLY TWO TIMES DAILY. Patient not taking: Reported on 07/22/2020 02/29/20 02/28/21  Maretta Bees, MD  cephALEXin (KEFLEX) 500 MG capsule Take 1 capsule (500 mg total) by mouth 4 (four) times daily. 08/21/20   Mesner, Barbara Cower, MD  diclofenac Sodium (VOLTAREN) 1 % GEL Apply 1 application topically 2 (two) times daily as needed (chest pains).    [provider]  insulin aspart (NOVOLOG FLEXPEN) 100 UNIT/ML FlexPen Inject 15 Units into the skin 2 (two) times daily  before a meal.    [provider]  insulin glargine (LANTUS) 100 UNIT/ML Solostar Pen INJECT 30 UNITS INTO THE SKIN AT BEDTIME Patient taking differently: Inject 60 Units into the skin at bedtime. 02/29/20 02/28/21  Ghimire, Werner Lean, MD  insulin glargine (LANTUS) 100 unit/mL SOPN Inject 30 Units into the skin at bedtime. Patient not taking: Reported on 07/22/2020 02/29/20   Maretta Bees, MD  lamoTRIgine (LAMICTAL) 25 MG tablet Take 2 tablets (50 mg total) by mouth 2 (two) times daily. For mood stabilization 11/11/18   Armandina Stammer I, NP  lubiprostone (AMITIZA) 24 MCG capsule Take 1 capsule (24 mcg total) by mouth 2 (two) times daily with a meal. Patient taking differently: Take 24 mcg by mouth daily with breakfast. 10/27/18   Esterwood, Amy S, PA-C  metoCLOPramide (REGLAN) 5 MG tablet Take 1 tablet (5 mg total) by mouth 3 (three) times daily. Patient taking differently: Take 5 mg by mouth as needed for nausea. 10/03/19   Jerald Kief, MD  mirtazapine (REMERON) 7.5 MG tablet Take 1  tablet (7.5 mg total) by mouth at bedtime. For depression/sleep 11/11/18   Armandina Stammer I, NP  Multiple Vitamin (MULTIVITAMIN WITH MINERALS) TABS tablet Take 1 tablet by mouth daily. 09/18/18   Clapacs, Jackquline Denmark, MD  ondansetron (ZOFRAN ODT) 4 MG disintegrating tablet 4mg  ODT q4 hours prn nausea/vomit 08/21/20   Mesner, 08/23/20, MD  pantoprazole (PROTONIX) 40 MG tablet Take 1 tablet (40 mg total) by mouth 2 (two) times daily. For acid reflux 01/29/19   01/31/19, MD  penicillin v potassium (VEETID) 500 MG tablet Take 500 mg by mouth every 6 (six) hours. 07/10/20   [provider]  phenazopyridine (PYRIDIUM) 200 MG tablet Take 1 tablet (200 mg total) by mouth 3 (three) times daily as needed for pain. 08/21/20   Mesner, 08/23/20, MD  polyethylene glycol (MIRALAX / GLYCOLAX) 17 g packet Take 17 g by mouth 2 (two) times daily. Patient taking differently: Take 17 g by mouth daily as needed for mild constipation.  01/16/20   Swayze, Ava, DO  promethazine (PHENERGAN) 25 MG suppository PLACE 1 SUPPOSITORY (25 MG TOTAL) RECTALLY EVERY 6 (SIX) HOURS AS NEEDED FOR NAUSEA OR VOMITING. Patient taking differently: Place 25 mg rectally every 6 (six) hours as needed for nausea or vomiting. 02/29/20 02/28/21  Ghimire, 03/02/21, MD  propranolol (INDERAL) 20 MG tablet Take 1 tablet (20 mg total) by mouth 2 (two) times daily. For anxiety/HTN Patient taking differently: Take 20 mg by mouth daily. For anxiety/HTN 12/23/18   Swayze, Ava, DO  QUEtiapine (SEROQUEL) 100 MG tablet Take 300 mg by mouth at bedtime. 07/05/19   [provider]  QUEtiapine (SEROQUEL) 25 MG tablet Take 50 mg by mouth every morning.    [provider]  senna-docusate (SENOKOT-S) 8.6-50 MG tablet Take 2 tablets by mouth at bedtime. Patient not taking: Reported on 07/22/2020 02/29/20   03/02/20, MD  senna-docusate (SENOKOT-S) 8.6-50 MG tablet TAKE 2 TABLETS BY MOUTH AT BEDTIME. Patient not taking: Reported on 07/22/2020 02/29/20 02/28/21  03/02/21, MD  topiramate (TOPAMAX) 25 MG tablet Take 1 tablet (25 mg total) by mouth 2 (two) times daily. For seizure activities 11/11/18   13/5/20 I, NP  prochlorperazine (COMPAZINE) 25 MG suppository PLACE 1 SUPPOSITORY (25 MG TOTAL) RECTALLY EVERY TWELVE HOURS AS NEEDED FOR NAUSEA OR VOMITING. 02/29/20 02/29/20  Ghimire, 03/02/20, MD    Allergies Tomato, Ibuprofen, and Oatmeal  Family History  Problem Relation Age of Onset   Diabetes Mother    Hypertension Mother    Obesity Mother    Asthma Mother    Allergic rhinitis Mother    Eczema Mother    Cervical cancer Mother    Diabetes Father    Hypertension Father    Obesity Father    Hyperlipidemia Father    Hypertension Paternal Aunt    Hypertension Maternal Grandfather    Colon cancer Maternal Grandfather    Diabetes Paternal Grandmother    Obesity Paternal Grandmother    Diabetes Paternal Grandfather    Obesity  Paternal Grandfather    Angioedema Neg Hx    Immunodeficiency Neg Hx    Urticaria Neg Hx    Stomach cancer Neg Hx    Esophageal cancer Neg Hx     Social History Social History   Tobacco Use   Smoking status: Never   Smokeless tobacco: Never  Vaping Use   Vaping Use: Never used  Substance Use Topics   Alcohol use: No    Alcohol/week:  0.0 standard drinks   Drug use: No    Review of Systems Constitutional: No fever/chills Eyes: No visual changes. ENT: No sore throat. Cardiovascular: Positive for chest pain. Respiratory: Denies shortness of breath. Gastrointestinal: Positive for persistent nausea and vomiting as well as generalized aching abdominal pain. Genitourinary: Negative for dysuria. Musculoskeletal: Negative for neck pain.  Negative for back pain. Integumentary: Negative for rash. Neurological: Negative for headaches, focal weakness or numbness.   ____________________________________________   PHYSICAL EXAM:  VITAL SIGNS: ED Triage Vitals  Enc Vitals Group     BP 10/31/20 1957 100/67     Pulse Rate 10/31/20 1957 (!) 123     Resp 10/31/20 1957 18     Temp 10/31/20 1957 98.2 F (36.8 C)     Temp Source 10/31/20 1957 Oral     SpO2 10/31/20 1957 100 %     Weight 10/31/20 2000 83.9 kg (185 lb)     Height 10/31/20 2000 1.6 m (5\' 3" )     Head Circumference --      Peak Flow --      Pain Score 10/31/20 1959 9     Pain Loc --      Pain Edu? --      Excl. in GC? --     Constitutional: Alert and oriented.  Does not appear to be in distress.  Initially asleep when I evaluated her, woke up easily to light touch and soft voice, began to grimace and moan shortly after I woke her up and after we started talking about her presentation. Eyes: Conjunctivae are normal.  Head: Atraumatic. Nose: No congestion/rhinnorhea. Mouth/Throat: Patient is wearing a mask. Neck: No stridor.  No meningeal signs.   Cardiovascular: Normal rate, regular rhythm. Good peripheral  circulation. Respiratory: Normal respiratory effort.  No retractions. Gastrointestinal: Soft and nondistended.  Mild diffuse tenderness to palpation throughout the abdomen without any focal tenderness. Musculoskeletal: No lower extremity tenderness nor edema. No gross deformities of extremities. Neurologic:  Normal speech and language. No gross focal neurologic deficits are appreciated.  Skin:  Skin is warm, dry and intact. Psychiatric: Mood and affect are normal. Speech and behavior are normal.  Calm, cooperative, pleasant.  ____________________________________________   LABS (all labs ordered are listed, but only abnormal results are displayed)  Labs Reviewed  COMPREHENSIVE METABOLIC PANEL - Abnormal; Notable for the following components:      Result Value   Potassium 3.3 (*)    Glucose, Bld 185 (*)    Total Protein 8.6 (*)    All other components within normal limits  CBC - Abnormal; Notable for the following components:   MCV 76.5 (*)    MCH 25.8 (*)    RDW 15.6 (*)    Platelets 468 (*)    All other components within normal limits  RESP PANEL BY RT-PCR (FLU A&B, COVID) ARPGX2  LIPASE, BLOOD  URINALYSIS, ROUTINE W REFLEX MICROSCOPIC  POC URINE PREG, ED  TROPONIN I (HIGH SENSITIVITY)  TROPONIN I (HIGH SENSITIVITY)   ____________________________________________  EKG  ED ECG REPORT I, Loleta [name], the attending physician, personally viewed and interpreted this ECG.  Date: 10/31/2020 EKG Time: 20: 02 Rate: 119 Rhythm: Sinus tachycardia QRS Axis: normal Intervals: QTc 447 ms ST/T Wave abnormalities: Non-specific ST segment / T-wave changes, but no clear evidence of acute ischemia. Narrative Interpretation: no definitive evidence of acute ischemia; does not meet STEMI criteria.  ____________________________________________  RADIOLOGY I, Loleta [name], personally viewed and evaluated these images (plain radiographs) as  part of my medical decision making, as well as  reviewing the written report by the radiologist.  ED MD interpretation: No acute abnormalities on chest x-ray  Official radiology report(s): DG Chest 2 View  Result Date: 10/31/2020 CLINICAL DATA:  Chest pain. EXAM: CHEST - 2 VIEW COMPARISON:  04/15/2020 FINDINGS: The cardiac silhouette, mediastinal and hilar contours are normal. The lungs are clear. No pleural effusions. No pulmonary lesions. The bony thorax is intact. IMPRESSION: No acute cardiopulmonary findings. Electronically Signed   By: Rudie Meyer M.D.   On: 10/31/2020 20:46    ____________________________________________   PROCEDURES   Procedure(s) performed (including Critical Care):  Procedures   ____________________________________________   INITIAL IMPRESSION / MDM / ASSESSMENT AND PLAN / ED COURSE  As part of my medical decision making, I reviewed the following data within the electronic MEDICAL RECORD NUMBER Nursing notes reviewed and incorporated, Labs reviewed , EKG interpreted , Old chart reviewed, Radiograph reviewed , and Notes from prior ED visits   Differential diagnosis includes, but is not limited to, cyclic vomiting syndrome, cannabinoid hyperemesis syndrome, medication/drug side effect, pseudoseizures, epileptic seizures, metabolic or electrolyte abnormality, less likely acute infection.  Patient has a well-documented history of pseudoseizures in the medical record and has had similar presentations in the past to emergency departments.  Physical exam is reassuring.  Comprehensive metabolic panel is within normal limits other than a very mild hypokalemia, respiratory viral panel is negative, high-sensitivity troponin is negative x2, CBC is normal, lipase is negative.  I personally reviewed the patient's imaging and agree with the radiologist's interpretation that there are no acute abnormalities on chest x-ray.  Vital signs are stable and within normal limits.  Strongly suspect cannabinoid hyperemesis syndrome  versus cyclic vomiting syndrome.  I believe she is having pseudoseizures as result of the stress her body is experiencing due to her nausea.  No evidence of dehydration nor hemodynamic instability.  No concerning findings on physical exam that would suggest the need for advanced imaging.  I spoke with the patient and recommended we try some medication and fluids.  She agreed with this plan and agreed that this typically makes her feel better.  I am giving her droperidol 2.5 mg IV and 1 L LR IV bolus.  I will then reassess.      Clinical Course as of 11/01/20 0920  Thu Nov 01, 2020  [id number] The patient has been resting comfortably.  No seizure-like activity during 12+ hours she has been in the emergency department.  I woke her up and she says she feels much better and is ready to go home.  She asked how soon she could call for her ride.  I encouraged her to continue taking her regular medications and follow-up with her outpatient doctor.  I gave my usual and customary return precautions. [CF]    Clinical Course User Index [CF] Loleta [name], MD     ____________________________________________  FINAL CLINICAL IMPRESSION(S) / ED DIAGNOSES  Final diagnoses:  Nausea and vomiting, unspecified vomiting type  Seizure-like activity (HCC)     MEDICATIONS GIVEN DURING THIS VISIT:  Medications  ondansetron (ZOFRAN-ODT) disintegrating tablet 4 mg (4 mg Oral Given 10/31/20 2008)  acetaminophen (TYLENOL) tablet 650 mg (650 mg Oral Given 10/31/20 2008)  lactated ringers bolus 1,000 mL (0 mLs Intravenous Stopped 11/01/20 0625)  droperidol (INAPSINE) 2.5 MG/ML injection 2.5 mg (2.5 mg Intravenous Given 11/01/20 0408)     ED Discharge Orders     None  Note:  This document was prepared using Dragon voice recognition software and may include unintentional dictation errors.   Loleta [name], MD 11/01/20 [phone]

## 2020-11-01 NOTE — Discharge Instructions (Signed)
Your workup in the Emergency Department today was reassuring.  We did not find any specific abnormalities.  We recommend you drink plenty of fluids, take your regular medications and/or any new ones prescribed today, and follow up with the doctor(s) listed in these documents as recommended.  Return to the Emergency Department if you develop new or worsening symptoms that concern you.  

## 2020-11-01 NOTE — ED Notes (Signed)
Patient has been sleeping since being medicated No nausea/vomiting Patient is stable Side rails up, will continue to monitor

## 2020-11-12 IMAGING — CR DG CHEST 2V
2 series · 2 of 2 positions shown · non-contrast
Comparison: 10/19/2019

CLINICAL DATA: Chest pain for 2 hours, multiple breakthrough
seizures today, shortness of breath

EXAM:
CHEST - 2 VIEW

[chest pa]
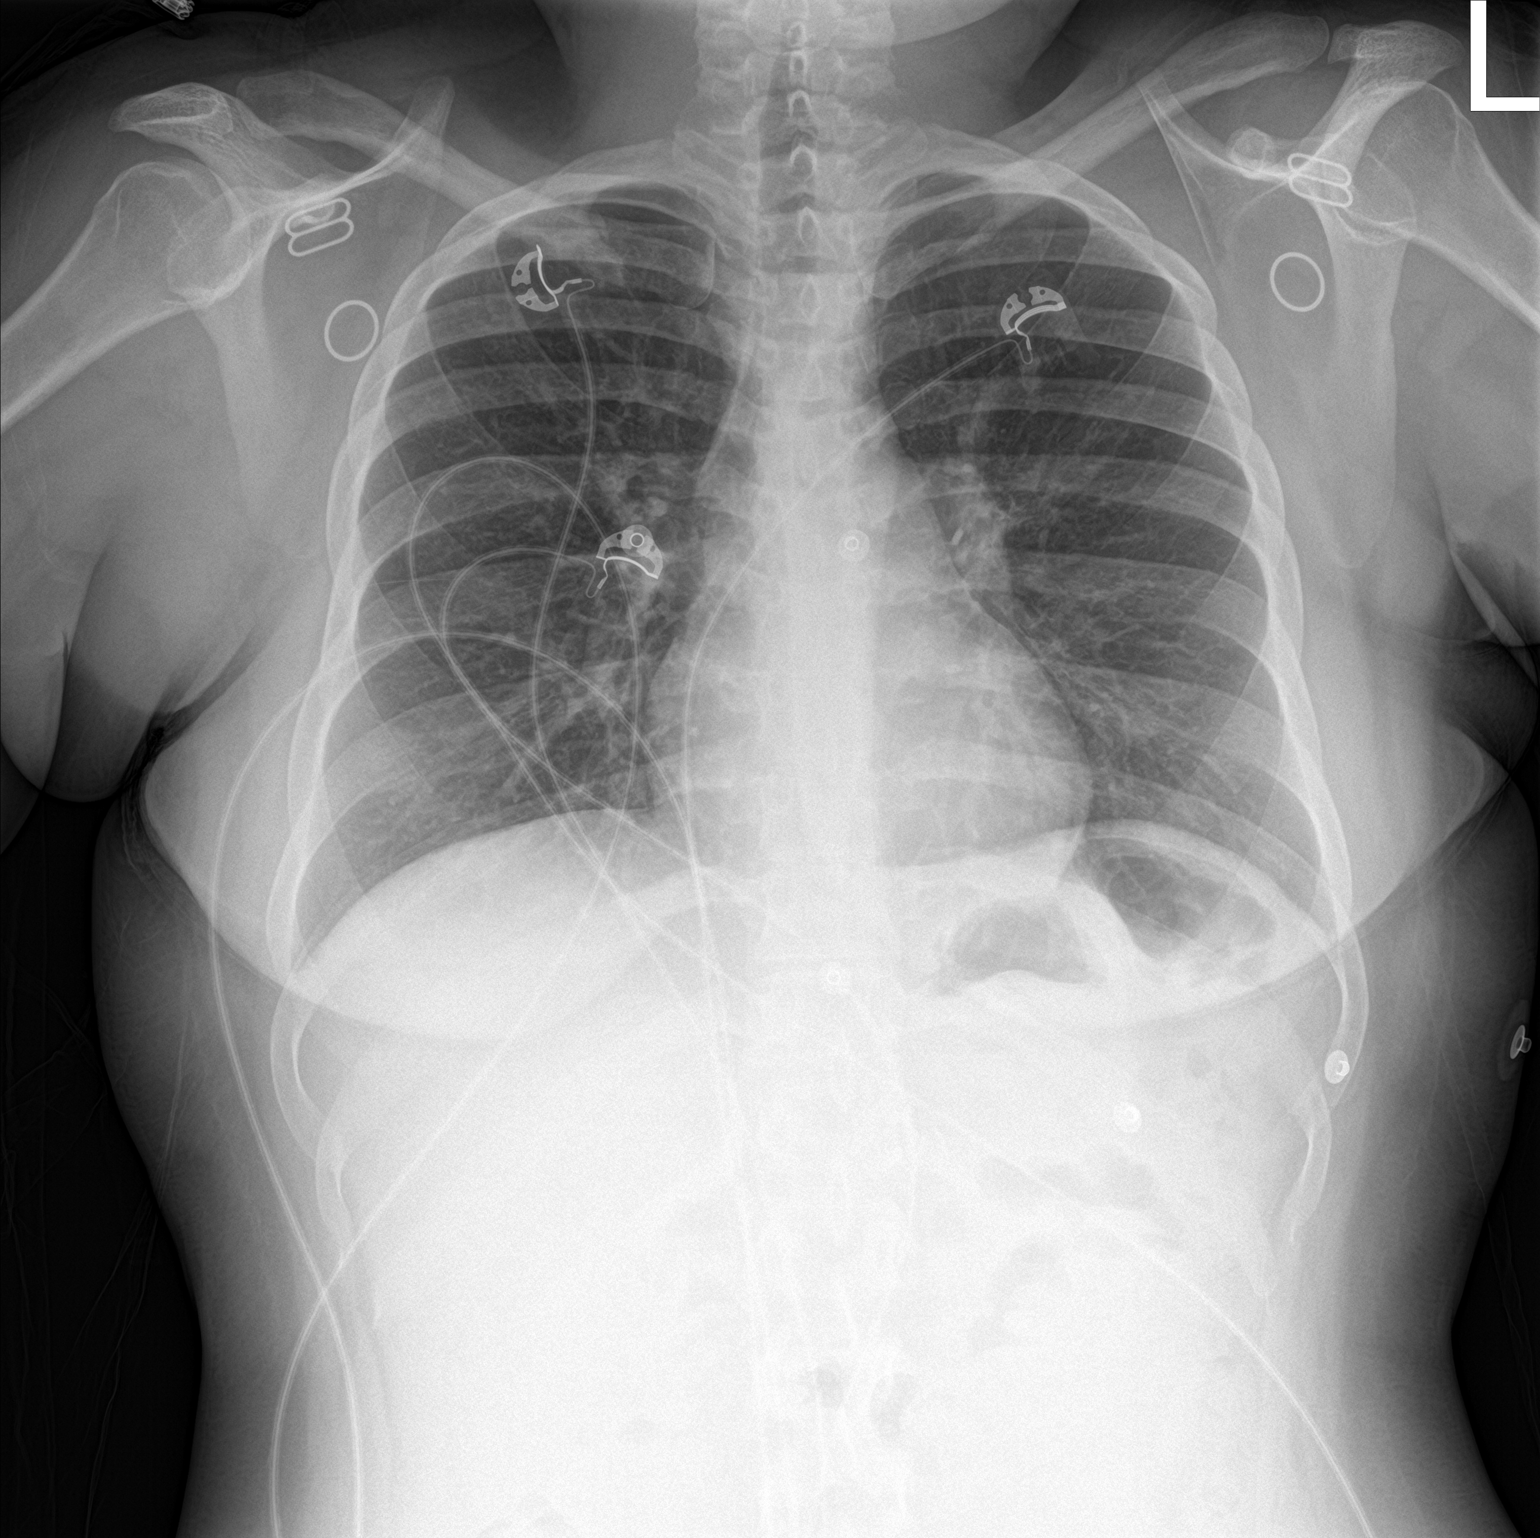

[chest lat]
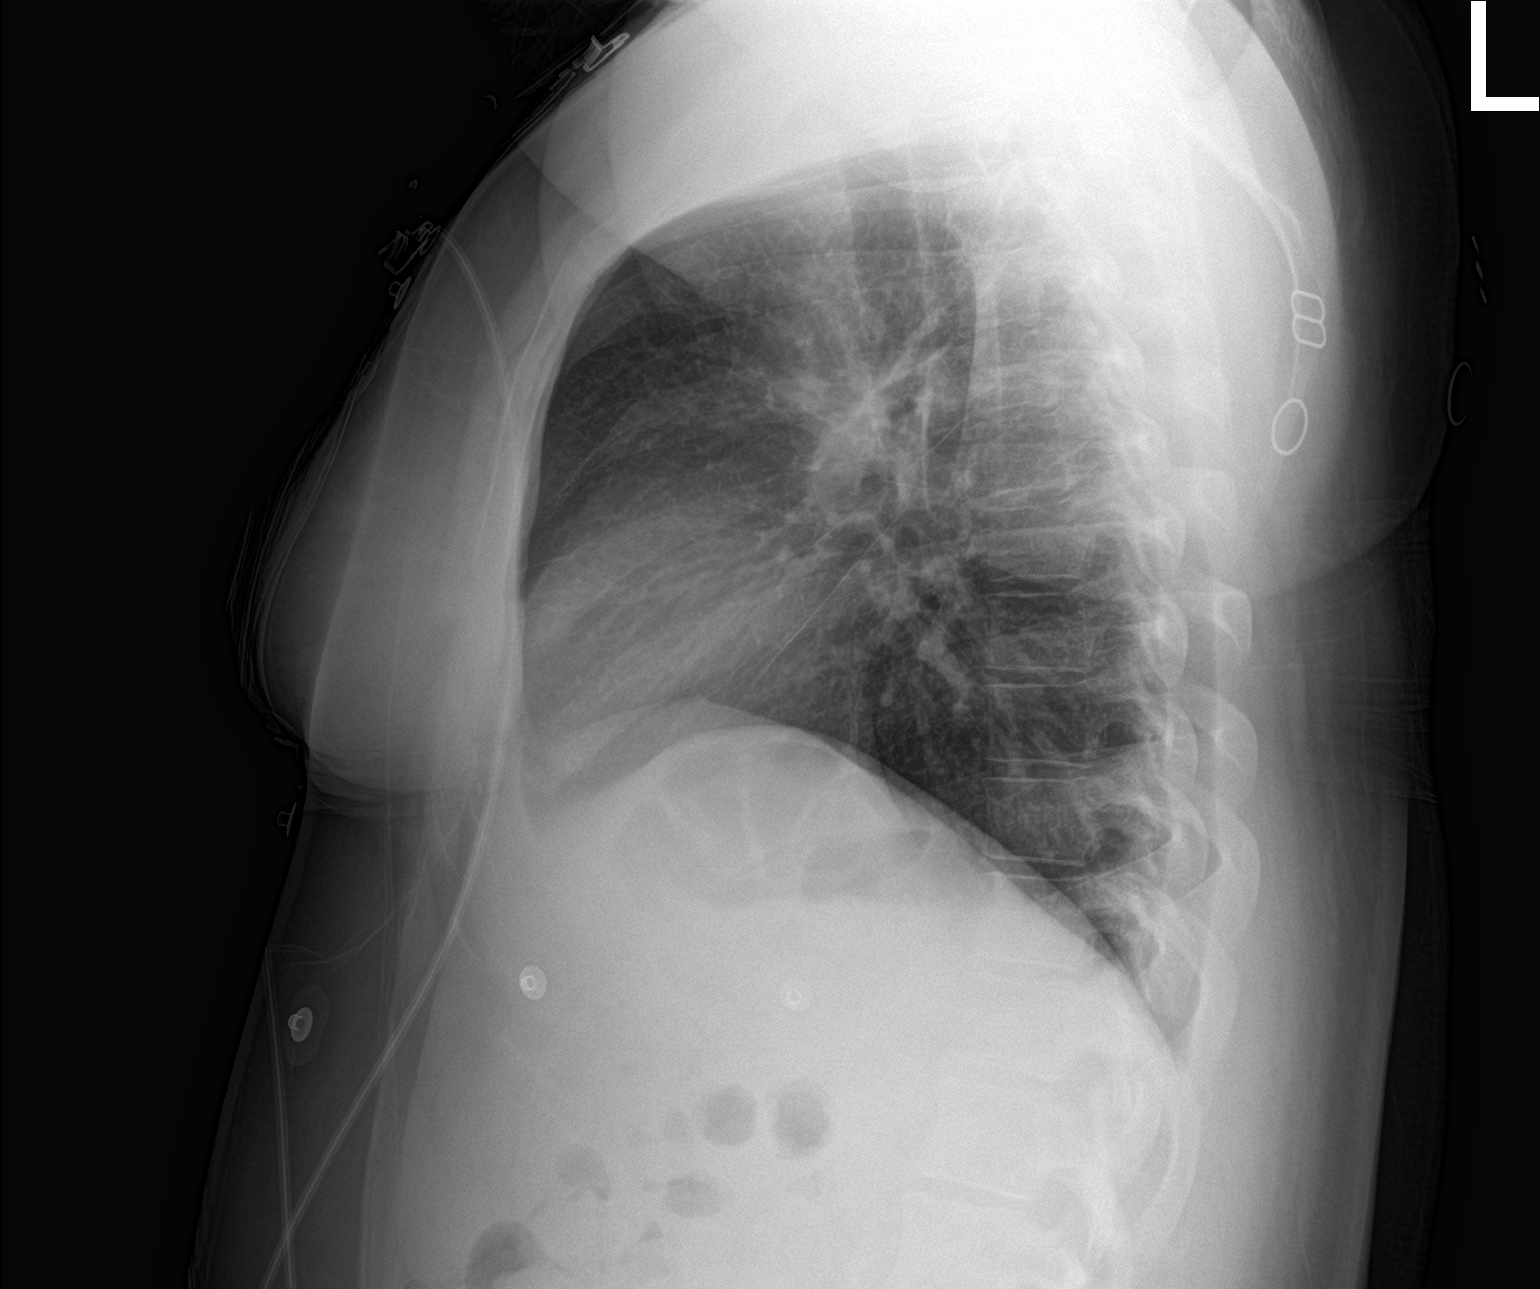

[2 of 2 positions shown; findings below may reference images not displayed]

FINDINGS: Normal heart size, mediastinal contours, and pulmonary vascularity.

Lungs clear.

No pleural effusion or pneumothorax.

Bones unremarkable.
IMPRESSION: Normal exam.

## 2020-11-20 IMAGING — DX DG ABDOMEN ACUTE W/ 1V CHEST
3 series · 3 of 3 positions shown · non-contrast
Comparison: Chest radiograph 11/13/2019

CLINICAL DATA: 19-year-old with abdominal pain and vomiting.

EXAM:
DG ABDOMEN ACUTE WITH 1 VIEW CHEST

[w chest pa]
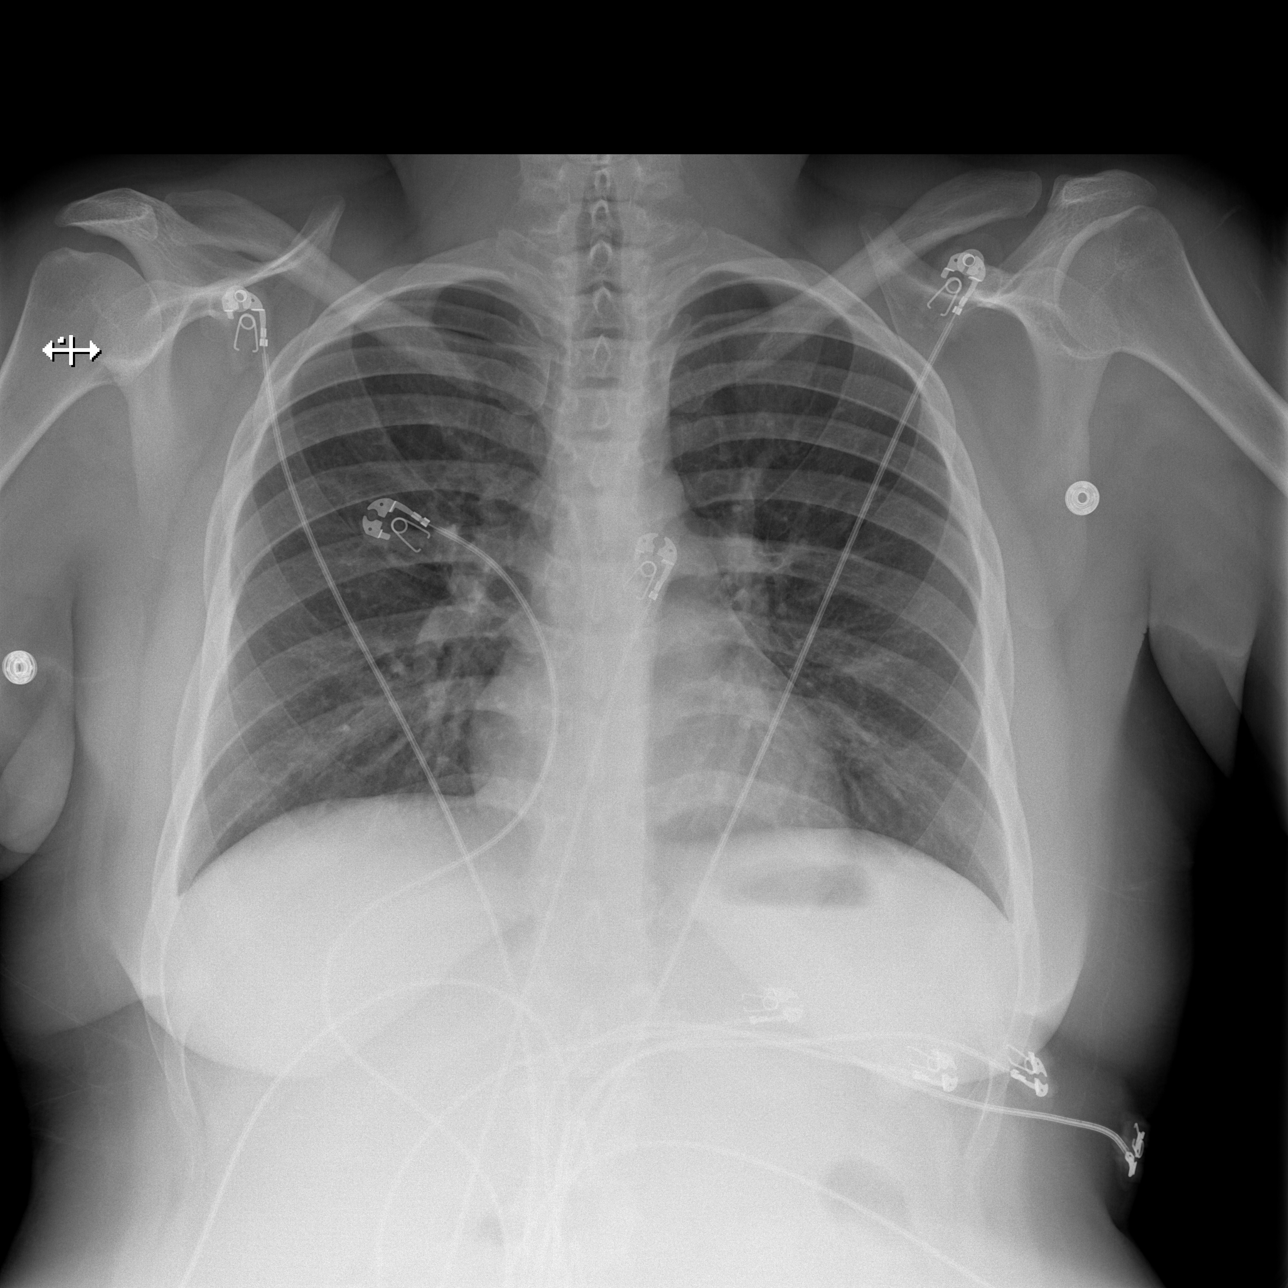

[w abdomen upright]
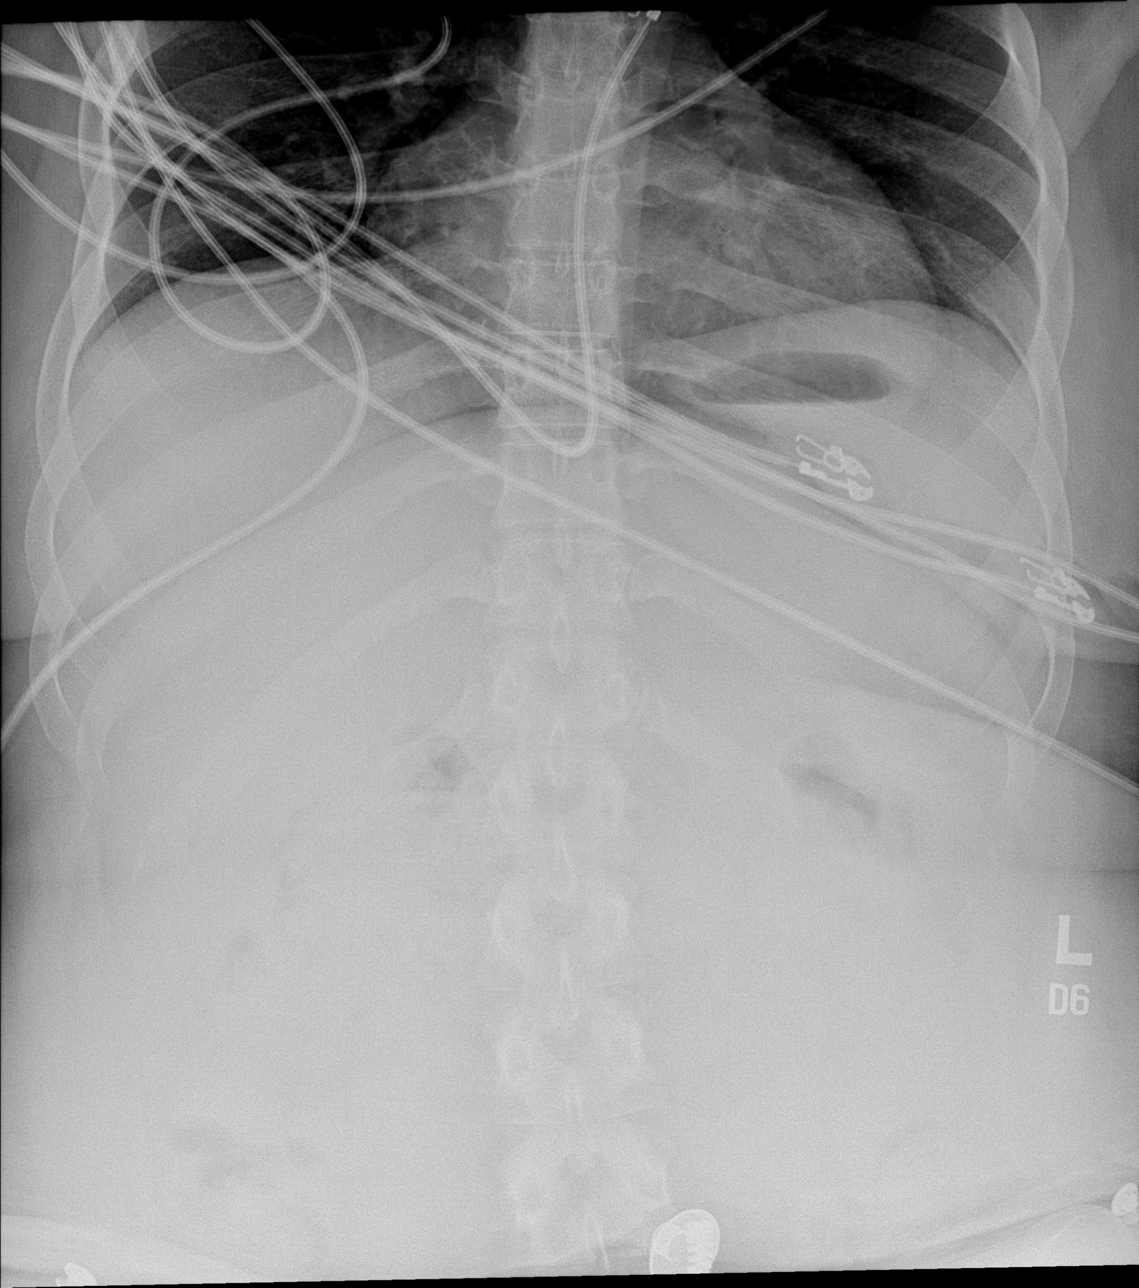

[t abdomen supine]
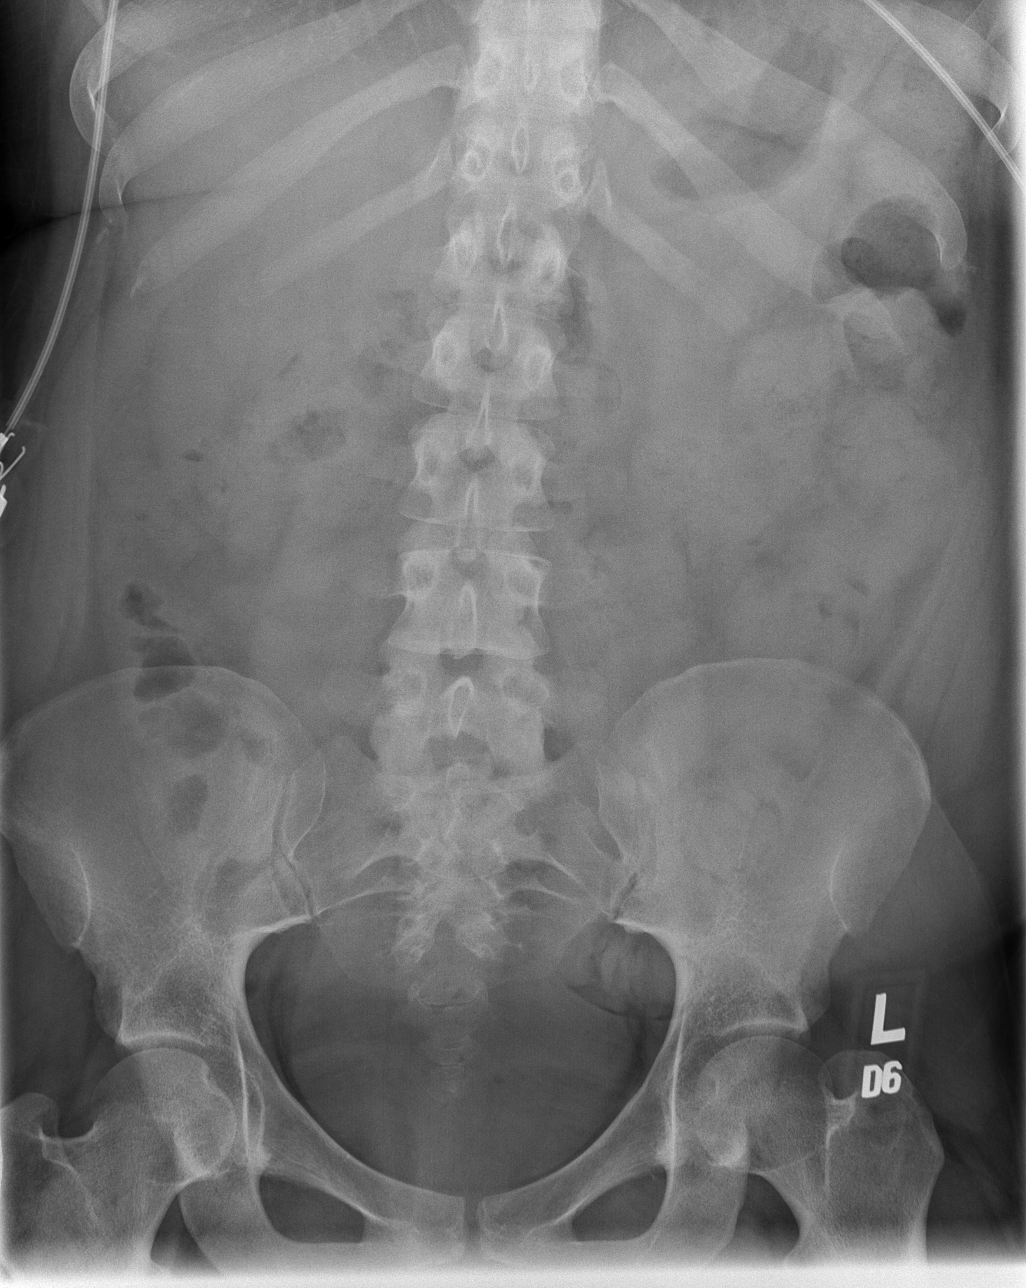

[3 of 3 positions shown; findings below may reference images not displayed]

FINDINGS: Both lungs are clear. Heart size is normal. Negative for free air.
Nonobstructive bowel gas pattern. Small to moderate amount of stool
in the abdomen. No large abdominal or pelvic calcifications. Bone
structures are unremarkable.
IMPRESSION: Negative abdominal radiographs.  No acute cardiopulmonary disease.

## 2020-11-23 IMAGING — CR DG ABDOMEN 2V
2 series · 2 of 2 positions shown · non-contrast
Comparison: None.

CLINICAL DATA: Intractable vomiting

EXAM:
ABDOMEN - 2 VIEW

[abdomen erect]
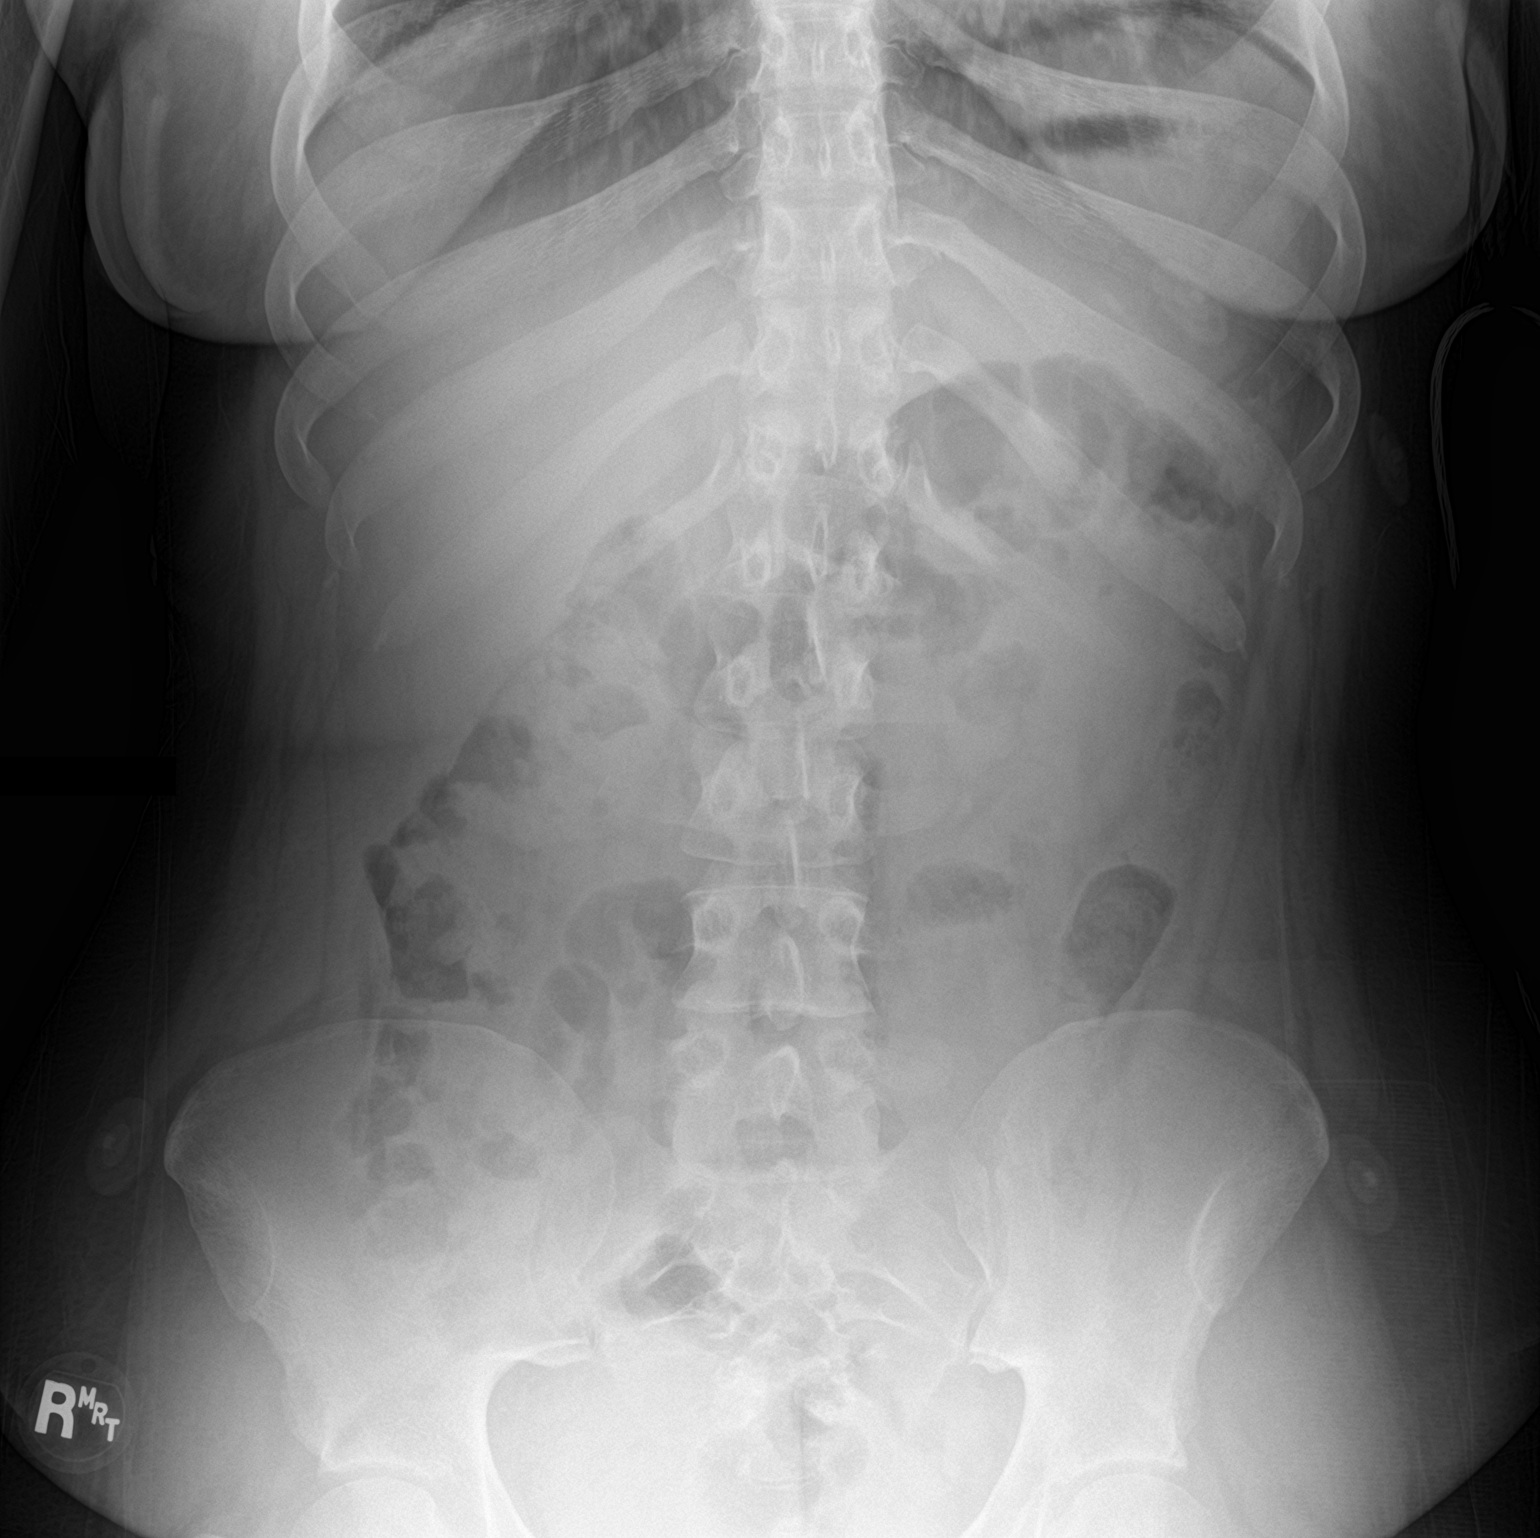

[abdomen supine]
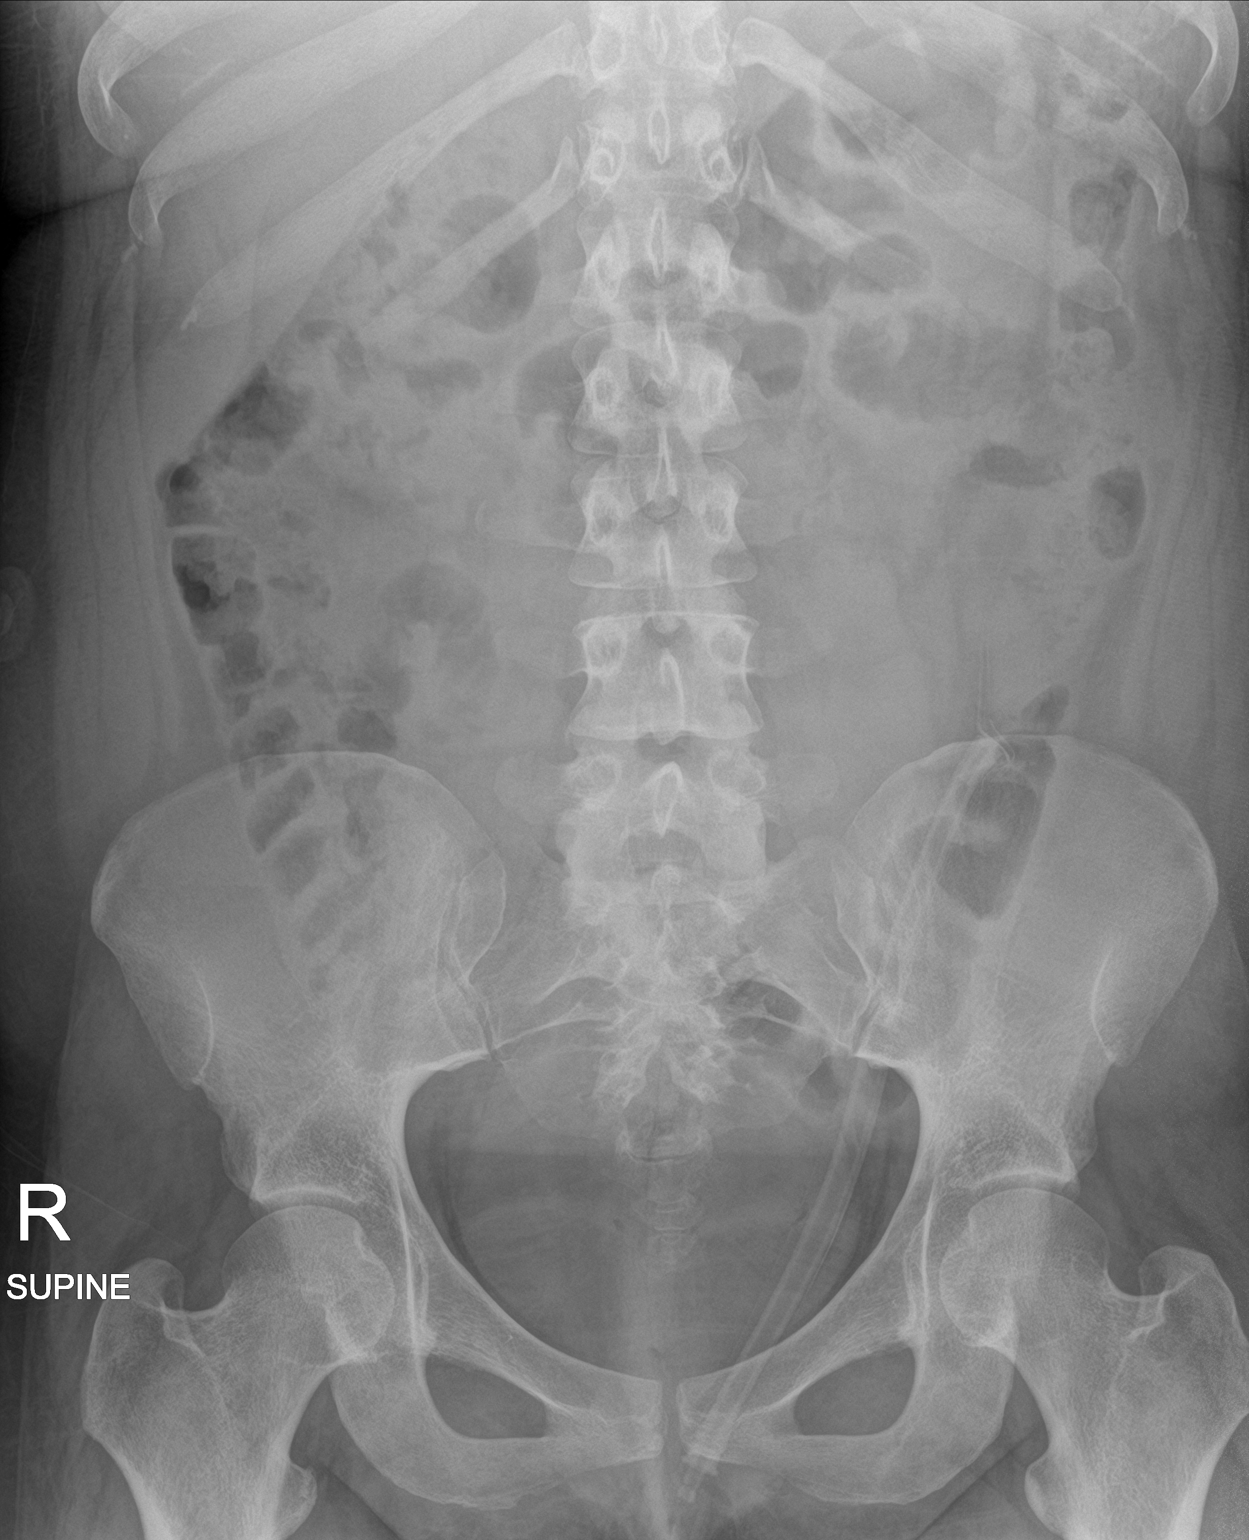

[2 of 2 positions shown; findings below may reference images not displayed]

FINDINGS: The bowel gas pattern is normal. There is no evidence of free air.
No radio-opaque calculi or other significant radiographic
abnormality is seen.
IMPRESSION: Negative.

## 2020-12-04 ENCOUNTER — Emergency Department (HOSPITAL_COMMUNITY)
Admission: EM | Admit: 2020-12-04 | Discharge: 2020-12-04 | Disposition: A | Discharge status: Home / Self Care | Payer: Medicaid Other | Attending: Emergency Medicine | Admitting: Emergency Medicine

## 2020-12-04 ENCOUNTER — Encounter (HOSPITAL_COMMUNITY): Payer: Self-pay

## 2020-12-04 ENCOUNTER — Other Ambulatory Visit: Payer: Self-pay

## 2020-12-04 DIAGNOSIS — E119 Type 2 diabetes mellitus without complications: Secondary | ICD-10-CM | POA: Diagnosis not present

## 2020-12-04 DIAGNOSIS — Z794 Long term (current) use of insulin: Secondary | ICD-10-CM | POA: Diagnosis not present

## 2020-12-04 DIAGNOSIS — I110 Hypertensive heart disease with heart failure: Secondary | ICD-10-CM | POA: Diagnosis not present

## 2020-12-04 DIAGNOSIS — R1084 Generalized abdominal pain: Secondary | ICD-10-CM | POA: Diagnosis present

## 2020-12-04 DIAGNOSIS — Z79899 Other long term (current) drug therapy: Secondary | ICD-10-CM | POA: Insufficient documentation

## 2020-12-04 DIAGNOSIS — I509 Heart failure, unspecified: Secondary | ICD-10-CM | POA: Diagnosis not present

## 2020-12-04 LAB — CBC WITH DIFFERENTIAL/PLATELET
Abs Immature Granulocytes: 0.03 10*3/uL (ref 0.00–0.07)
Basophils Absolute: 0 10*3/uL (ref 0.0–0.1)
Basophils Relative: 0 %
Eosinophils Absolute: 0 10*3/uL (ref 0.0–0.5)
Eosinophils Relative: 0 %
HCT: 37.1 % (ref 36.0–46.0)
Hemoglobin: 11.9 g/dL — ABNORMAL LOW (ref 12.0–15.0)
Immature Granulocytes: 0 %
Lymphocytes Relative: 12 %
Lymphs Abs: 1.1 10*3/uL (ref 0.7–4.0)
MCH: 24.7 pg — ABNORMAL LOW (ref 26.0–34.0)
MCHC: 32.1 g/dL (ref 30.0–36.0)
MCV: 77.1 fL — ABNORMAL LOW (ref 80.0–100.0)
Monocytes Absolute: 0.4 10*3/uL (ref 0.1–1.0)
Monocytes Relative: 4 %
Neutro Abs: 7.8 10*3/uL — ABNORMAL HIGH (ref 1.7–7.7)
Neutrophils Relative %: 84 %
Platelets: 415 10*3/uL — ABNORMAL HIGH (ref 150–400)
RBC: 4.81 MIL/uL (ref 3.87–5.11)
RDW: 16.2 % — ABNORMAL HIGH (ref 11.5–15.5)
WBC: 9.3 10*3/uL (ref 4.0–10.5)
nRBC: 0 % (ref 0.0–0.2)

## 2020-12-04 LAB — COMPREHENSIVE METABOLIC PANEL
ALT: 16 U/L (ref 0–44)
AST: 20 U/L (ref 15–41)
Albumin: 4.3 g/dL (ref 3.5–5.0)
Alkaline Phosphatase: 78 U/L (ref 38–126)
Anion gap: 10 (ref 5–15)
BUN: 12 mg/dL (ref 6–20)
CO2: 27 mmol/L (ref 22–32)
Calcium: 8.9 mg/dL (ref 8.9–10.3)
Chloride: 101 mmol/L (ref 98–111)
Creatinine, Ser: 0.49 mg/dL (ref 0.44–1.00)
GFR, Estimated: >60 mL/min (ref >60)
Glucose, Bld: 188 mg/dL — ABNORMAL HIGH (ref 70–99)
Potassium: 3.6 mmol/L (ref 3.5–5.1)
Sodium: 138 mmol/L (ref 135–145)
Total Bilirubin: 0.6 mg/dL (ref 0.3–1.2)
Total Protein: 7.9 g/dL (ref 6.5–8.1)

## 2020-12-04 LAB — I-STAT BETA HCG BLOOD, ED (MC, WL, AP ONLY): I-stat hCG, quantitative: <5 m[IU]/mL (ref <5)

## 2020-12-04 LAB — LIPASE, BLOOD: Lipase: 28 U/L (ref 11–51)

## 2020-12-04 MED ORDER — LIDOCAINE VISCOUS HCL 2 % MT SOLN
15.0000 mL | Freq: Once | OROMUCOSAL | Status: AC
  Administered 2020-12-04: 15 mL via ORAL
  Filled 2020-12-04: qty 15

## 2020-12-04 MED ORDER — DROPERIDOL 2.5 MG/ML IJ SOLN
1.2500 mg | Freq: Once | INTRAMUSCULAR | Status: AC
  Administered 2020-12-04: 1.25 mg via INTRAVENOUS

## 2020-12-04 MED ORDER — SODIUM CHLORIDE 0.9 % IV BOLUS
1000.0000 mL | Freq: Once | INTRAVENOUS | Status: AC
  Administered 2020-12-04: 1000 mL via INTRAVENOUS

## 2020-12-04 MED ORDER — ALUM & MAG HYDROXIDE-SIMETH 200-200-20 MG/5ML PO SUSP
30.0000 mL | Freq: Once | ORAL | Status: AC
  Administered 2020-12-04: 30 mL via ORAL

## 2020-12-04 NOTE — Discharge Instructions (Signed)
You have been seen and discharged from the emergency department.  Your blood work was normal.  Follow-up with your primary provider for reevaluation and further care. Take home medications as prescribed. If you have any worsening symptoms or further concerns for your health please return to an emergency department for further evaluation. 

## 2020-12-04 NOTE — ED Provider Notes (Signed)
[facility] DEPT Provider Note   CSN: [id number] Arrival date & time: 12/04/20  1353     History Chief Complaint  Patient presents with   Abdominal Pain    Nancy [name] is a 20 y.o. female.  20 year old female with prior medical history as detailed below presents for evaluation.  Patient complains of diffuse abdominal discomfort.  This is associate with nausea vomiting.  Patient reports that her symptoms have been ongoing for at least 2 weeks.  Patient presents today from work where she says that her symptoms became severe enough that she decided to come to the ED for evaluation.  Patient reports a history of stomach cancer.  Patient reports that she was on chemotherapy for same but had to stop secondary to insurance issues.  Patient with multiple prior presentations to the [facility] system facilities.  Patient with documented history of pseudoseizure, hyperemesis, etc.  Patient without documentation of prior oncologic process.  Most recent CT abdomen/pelvis performed in August 2022.  No significant pathology was found.  The history is provided by the patient.  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[place])    Premature baby    Seizures ([place])    Type II diabetes mellitus ([place])    insulin dependant    Patient Active Problem List   Diagnosis Date Noted   Abdominal pain 02/22/2020   Chronic constipation 02/22/2020   Nausea & vomiting 02/22/2020   SIRS  (systemic inflammatory response syndrome) ([place]) 01/14/2020   Diabetic gastroparesis ([place]) 11/25/2019   Polysubstance abuse ([place]) 11/25/2019   Gastritis 11/25/2019   DKA, type 1 ([place]) [id number]   Cyclical vomiting with nausea 10/01/2019   Intractable nausea and vomiting 09/30/2019   Cellulitis    DKA (diabetic ketoacidoses) 01/27/2019   Leukocytosis 12/19/2018   Dehydration    Pseudoseizures ([place])    Type 1 diabetes mellitus with ketoacidosis without coma ([place]) 11/28/2018   Hyperglycemia 11/27/2018   Severe recurrent major depression without psychotic features ([place]) 11/08/2018   MDD (major depressive disorder), recurrent episode, severe ([place]) 11/06/2018   Nonspecific abnormal electrocardiogram (ECG) (EKG)    Chest pain of uncertain etiology    Hypertensive urgency 10/08/2018   Conversion disorder with attacks or seizures, acute episode, with psychological stressor 09/16/2018   MDD (major depressive disorder), recurrent severe, without psychosis ([place])    AKI (acute kidney injury) ([place]) 08/04/2018   Seizure-like activity ([place]) 08/03/2018   Depression 07/25/2018   Syncope 01/30/2018   Orthostatic hypotension 01/24/2018   Tachycardia 12/28/2017   Chronic abdominal pain 12/24/2017   Chest pain 12/19/2017   Nausea and vomiting 08/21/2017   Generalized abdominal pain 08/21/2017   Non compliance with medical treatment 01/27/2012   Adjustment disorder 09/16/2011   Acanthosis nigricans    Goiter    Obesity 06/14/2010   Hypertension 06/14/2010    Past Surgical History:  Procedure Laterality  Date   ABDOMINAL HERNIA REPAIR     "I was a baby"   BIOPSY  10/12/2018   Procedure: BIOPSY;  Surgeon: Jackquline Denmark, MD;  Location: [facility] ENDOSCOPY;  Service: Endoscopy;;   BIOPSY  02/28/2020   Procedure: BIOPSY;  Surgeon: Lavena Bullion, DO;  Location: [facility] ENDOSCOPY;  Service: Gastroenterology;;   ESOPHAGOGASTRODUODENOSCOPY (EGD) WITH PROPOFOL N/A 10/12/2018   Procedure:  ESOPHAGOGASTRODUODENOSCOPY (EGD) WITH PROPOFOL;  Surgeon: Jackquline Denmark, MD;  Location[facility] ENDOSCOPY;  Service: Endoscopy;  Laterality: N/A;   ESOPHAGOGASTRODUODENOSCOPY (EGD) WITH PROPOFOL N/A 02/28/2020   Procedure: ESOPHAGOGASTRODUODENOSCOPY (EGD) WITH PROPOFOL;  Surgeon: Lavena Bullion, DO;  Location: [facility];  Service: Gastroenterology;  Laterality: N/A;   FLEXIBLE SIGMOIDOSCOPY N/A 02/28/2020   Procedure: FLEXIBLE SIGMOIDOSCOPY;  Surgeon: Lavena Bullion, DO;  Location: [facility];  Service: Gastroenterology;  Laterality: N/A;   HERNIA REPAIR     LEFT HEART CATH AND CORONARY ANGIOGRAPHY N/A 10/13/2018   Procedure: LEFT HEART CATH AND CORONARY ANGIOGRAPHY;  Surgeon: Burnell Blanks, MD;  Location: [facility];  Service: Cardiovascular;  Laterality: N/A;   TONSILLECTOMY AND ADENOIDECTOMY     WISDOM TOOTH EXTRACTION  2017     OB History   No obstetric history on file.     Family History  Problem Relation Age of Onset   Diabetes Mother    Hypertension Mother    Obesity Mother    Asthma Mother    Allergic rhinitis Mother    Eczema Mother    Cervical cancer Mother    Diabetes Father    Hypertension Father    Obesity Father    Hyperlipidemia Father    Hypertension Paternal Aunt    Hypertension Maternal Grandfather    Colon cancer Maternal Grandfather    Diabetes Paternal Grandmother    Obesity Paternal Grandmother    Diabetes Paternal Grandfather    Obesity Paternal Grandfather    Angioedema Neg Hx    Immunodeficiency Neg Hx    Urticaria Neg Hx    Stomach cancer Neg Hx    Esophageal cancer Neg Hx     Social History   Tobacco Use   Smoking status: Never   Smokeless tobacco: Never  Vaping Use   Vaping Use: Never used  Substance Use Topics   Alcohol use: No    Alcohol/week: 0.0 standard drinks   Drug use: No    Home Medications Prior to Admission medications   Medication Sig Start Date End Date Taking? Authorizing Provider   ARIPiprazole (ABILIFY) 10 MG tablet Take 10 mg by mouth in the morning and at bedtime.     [provider]  atorvastatin (LIPITOR) 20 MG tablet Take 1 tablet (20 mg total) by mouth daily at 6 PM. Patient taking differently: Take 20 mg by mouth daily. 10/05/18   Elsie Stain, MD  capsaicin (ZOSTRIX) 0.025 % cream Apply topically 2 (two) times daily. 02/29/20   Ghimire, Henreitta Leber, MD  capsaicin (ZOSTRIX) 0.025 % cream APPLY TOPICALLY TWO TIMES DAILY. Patient not taking: Reported on 07/22/2020 02/29/20 02/28/21  Jonetta Osgood, MD  cephALEXin (KEFLEX) 500 MG capsule Take 1 capsule (500 mg total) by mouth 4 (four) times daily. 08/21/20   Mesner, Corene Cornea, MD  diclofenac Sodium (VOLTAREN) 1 % GEL Apply 1 application topically 2 (two) times daily as needed (chest pains).    [provider]  insulin aspart (NOVOLOG FLEXPEN) 100 UNIT/ML FlexPen Inject 15 Units into the skin 2 (two) times daily before a  meal.    [provider]  insulin glargine (LANTUS) 100 UNIT/ML Solostar Pen INJECT 30 UNITS INTO THE SKIN AT BEDTIME Patient taking differently: Inject 60 Units into the skin at bedtime. 02/29/20 02/28/21  Ghimire, Henreitta Leber, MD  insulin glargine (LANTUS) 100 unit/mL SOPN Inject 30 Units into the skin at bedtime. Patient not taking: Reported on 07/22/2020 02/29/20   Jonetta Osgood, MD  lamoTRIgine (LAMICTAL) 25 MG tablet Take 2 tablets (50 mg total) by mouth 2 (two) times daily. For mood stabilization 11/11/18   Lindell Spar I, NP  lubiprostone (AMITIZA) 24 MCG capsule Take 1 capsule (24 mcg total) by mouth 2 (two) times daily with a meal. Patient taking differently: Take 24 mcg by mouth daily with breakfast. 10/27/18   Esterwood, Amy S, PA-C  metoCLOPramide (REGLAN) 5 MG tablet Take 1 tablet (5 mg total) by mouth 3 (three) times daily. Patient taking differently: Take 5 mg by mouth as needed for nausea. 10/03/19   Donne Hazel, MD  mirtazapine (REMERON) 7.5 MG tablet Take 1  tablet (7.5 mg total) by mouth at bedtime. For depression/sleep 11/11/18   Lindell Spar I, NP  Multiple Vitamin (MULTIVITAMIN WITH MINERALS) TABS tablet Take 1 tablet by mouth daily. 09/18/18   Clapacs, Madie Reno, MD  ondansetron (ZOFRAN ODT) 4 MG disintegrating tablet 4mg  ODT q4 hours prn nausea/vomit 08/21/20   Mesner, Corene Cornea, MD  pantoprazole (PROTONIX) 40 MG tablet Take 1 tablet (40 mg total) by mouth 2 (two) times daily. For acid reflux 01/29/19   Harold Hedge, MD  penicillin v potassium (VEETID) 500 MG tablet Take 500 mg by mouth every 6 (six) hours. 07/10/20   [provider]  phenazopyridine (PYRIDIUM) 200 MG tablet Take 1 tablet (200 mg total) by mouth 3 (three) times daily as needed for pain. 08/21/20   Mesner, Corene Cornea, MD  polyethylene glycol (MIRALAX / GLYCOLAX) 17 g packet Take 17 g by mouth 2 (two) times daily. Patient taking differently: Take 17 g by mouth daily as needed for mild constipation. 01/16/20   Swayze, Ava, DO  promethazine (PHENERGAN) 25 MG suppository PLACE 1 SUPPOSITORY (25 MG TOTAL) RECTALLY EVERY 6 (SIX) HOURS AS NEEDED FOR NAUSEA OR VOMITING. Patient taking differently: Place 25 mg rectally every 6 (six) hours as needed for nausea or vomiting. 02/29/20 02/28/21  Ghimire, Henreitta Leber, MD  propranolol (INDERAL) 20 MG tablet Take 1 tablet (20 mg total) by mouth 2 (two) times daily. For anxiety/HTN Patient taking differently: Take 20 mg by mouth daily. For anxiety/HTN 12/23/18   Swayze, Ava, DO  QUEtiapine (SEROQUEL) 100 MG tablet Take 300 mg by mouth at bedtime. 07/05/19   [provider]  QUEtiapine (SEROQUEL) 25 MG tablet Take 50 mg by mouth every morning.    [provider]  senna-docusate (SENOKOT-S) 8.6-50 MG tablet Take 2 tablets by mouth at bedtime. Patient not taking: Reported on 07/22/2020 02/29/20   Jonetta Osgood, MD  senna-docusate (SENOKOT-S) 8.6-50 MG tablet TAKE 2 TABLETS BY MOUTH AT BEDTIME. Patient not taking: Reported on 07/22/2020 02/29/20  02/28/21  Jonetta Osgood, MD  topiramate (TOPAMAX) 25 MG tablet Take 1 tablet (25 mg total) by mouth 2 (two) times daily. For seizure activities 11/11/18   Lindell Spar I, NP  prochlorperazine (COMPAZINE) 25 MG suppository PLACE 1 SUPPOSITORY (25 MG TOTAL) RECTALLY EVERY TWELVE HOURS AS NEEDED FOR NAUSEA OR VOMITING. 02/29/20 02/29/20  Ghimire, Henreitta Leber, MD    Allergies    Tomato, Ibuprofen, and Oatmeal  Review of Systems   Review of Systems  Gastrointestinal:  Positive for abdominal pain.  All other systems reviewed and are negative.  Physical Exam Updated Vital Signs BP (!) 181/109 (BP Location: Left Arm)   Pulse (!) 106   Temp 98.2 F (36.8 C) (Oral)   Resp (!) 23   Ht 5\' 3"  (1.6 m)   Wt 83.9 kg   LMP 12/04/2020 (Exact Date)   SpO2 98%   BMI 32.77 kg/m   Physical Exam Vitals and nursing note reviewed.  Constitutional:      General: She is not in acute distress.    Appearance: Normal appearance. She is well-developed.     Comments: Tearful, distraught  HENT:     Head: Normocephalic and atraumatic.  Eyes:     Conjunctiva/sclera: Conjunctivae normal.     Pupils: Pupils are equal, round, and reactive to light.  Cardiovascular:     Rate and Rhythm: Normal rate and regular rhythm.     Heart sounds: Normal heart sounds.  Pulmonary:     Effort: Pulmonary effort is normal. No respiratory distress.     Breath sounds: Normal breath sounds.  Abdominal:     General: There is no distension.     Palpations: Abdomen is soft.     Tenderness: There is generalized abdominal tenderness.  Musculoskeletal:        General: No deformity. Normal range of motion.     Cervical back: Normal range of motion and neck supple.  Skin:    General: Skin is warm and dry.  Neurological:     General: No focal deficit present.     Mental Status: She is alert and oriented to person, place, and time.    ED Results / Procedures / Treatments   Labs (all labs ordered are listed, but only abnormal  results are displayed) Labs Reviewed  COMPREHENSIVE METABOLIC PANEL  CBC WITH DIFFERENTIAL/PLATELET  LIPASE, BLOOD  URINALYSIS, ROUTINE W REFLEX MICROSCOPIC  RAPID URINE DRUG SCREEN, HOSP PERFORMED  I-STAT BETA HCG BLOOD, ED ([facility], [facility], [facility] ONLY)    EKG None  Radiology No results found.  Procedures Procedures   Medications Ordered in ED Medications  sodium chloride 0.9 % bolus 1,000 mL (has no administration in time range)  droperidol (INAPSINE) 2.5 MG/ML injection 1.25 mg (has no administration in time range)    ED Course  I have reviewed the triage vital signs and the nursing notes.  Pertinent labs & imaging results that were available during my care of the patient were reviewed by me and considered in my medical decision making (see chart for details).    MDM Rules/Calculators/A&P                           MDM  MSE complete  Nancy [name] was evaluated in Emergency Department on 12/04/2020 for the symptoms described in the history of present illness. She was evaluated in the context of the global COVID-19 pandemic, which necessitated consideration that the patient might be at risk for infection with the SARS-CoV-2 virus that causes COVID-19. Institutional protocols and algorithms that pertain to the evaluation of patients at risk for COVID-19 are in a state of rapid change based on information released by regulatory bodies including the CDC and federal and state organizations. These policies and algorithms were followed during the patient's care in the ED.  Patient presents with complaint of abdominal pain.   She is agitated and is  not overly communicative during evaluation.  Chart review shows multiple prior presentations that are similar nature.  Prior work-ups are routinely without discovery of significant abnormality.  Screening labs ordered.  IV fluids and droperidol ordered.  1615 - Re-evaluation - Patient comfortably resting. No distress noted. Labs still  in process.  Oncoming EDP aware of pending labs and disposition.   Final Clinical Impression(s) / ED Diagnoses Final diagnoses:  Generalized abdominal pain    Rx / DC Orders ED Discharge Orders     None        Wynetta Fines, MD 12/05/20 2319

## 2020-12-04 NOTE — ED Triage Notes (Signed)
Pt bib ems for abd pain, n/v x2 weeks.  Pt hx of stomach CA, was on  chemo but had to stop d/t insurance.

## 2020-12-04 NOTE — ED Provider Notes (Signed)
Patient signed out to me by previous provider. Please refer to their note for full HPI.  Briefly this is a 20 year old female with history of abdominal pain who presented with diffuse abdominal pain, nausea/vomiting for 2 weeks.  Patient initially spoke of a history of stomach cancer with chemo however this is not confirmed with chart review. Blood work is reassuring without any acute abnormalities.  Patient gets frequent CT of the abdomen pelvis and at this time we do not feel that there is any indication for emergent imaging.  Patient given a dose of droperidol and on shift changes sleeping. Physical Exam  BP 116/70   Pulse 99   Temp 98.2 F (36.8 C) (Oral)   Resp 13   Ht 5\' 3"  (1.6 m)   Wt 83.9 kg   LMP 12/04/2020 (Exact Date)   SpO2 99%   BMI 32.77 kg/m   Physical Exam Vitals and nursing note reviewed.  Constitutional:      Appearance: Normal appearance. She is not ill-appearing or toxic-appearing.  HENT:     Head: Normocephalic.     Mouth/Throat:     Mouth: Mucous membranes are moist.  Cardiovascular:     Rate and Rhythm: Normal rate.  Pulmonary:     Effort: Pulmonary effort is normal. No respiratory distress.  Abdominal:     Palpations: Abdomen is soft.     Tenderness: There is no abdominal tenderness. There is no guarding or rebound.  Skin:    General: Skin is warm.  Neurological:     Mental Status: She is alert and oriented to person, place, and time. Mental status is at baseline.  Psychiatric:        Mood and Affect: Mood normal.    ED Course/Procedures     Procedures  MDM   On reevaluation patient feels improved, requesting a GI cocktail.  After this she has been able to p.o.  We have been unable to retain urinalysis from the patient.  She denies any specific genitourinary symptoms.  He have given the patient multiple opportunities to provide a urine sample and she has been unable to.  Educated the patient on symptoms of UTI and to have an outpatient UA done.  Patient at this time appears safe and stable for discharge and will be treated as an outpatient.  Discharge plan and strict return to ED precautions discussed, patient verbalizes understanding and agreement.       12/06/2020, DO 12/04/20 2209

## 2020-12-31 IMAGING — CR DG CHEST 1V PORT
1 series · 1 of 1 positions shown · non-contrast
Comparison: November 24, 2019

CLINICAL DATA: Chest pain

EXAM:
PORTABLE CHEST 1 VIEW

[chest ap]
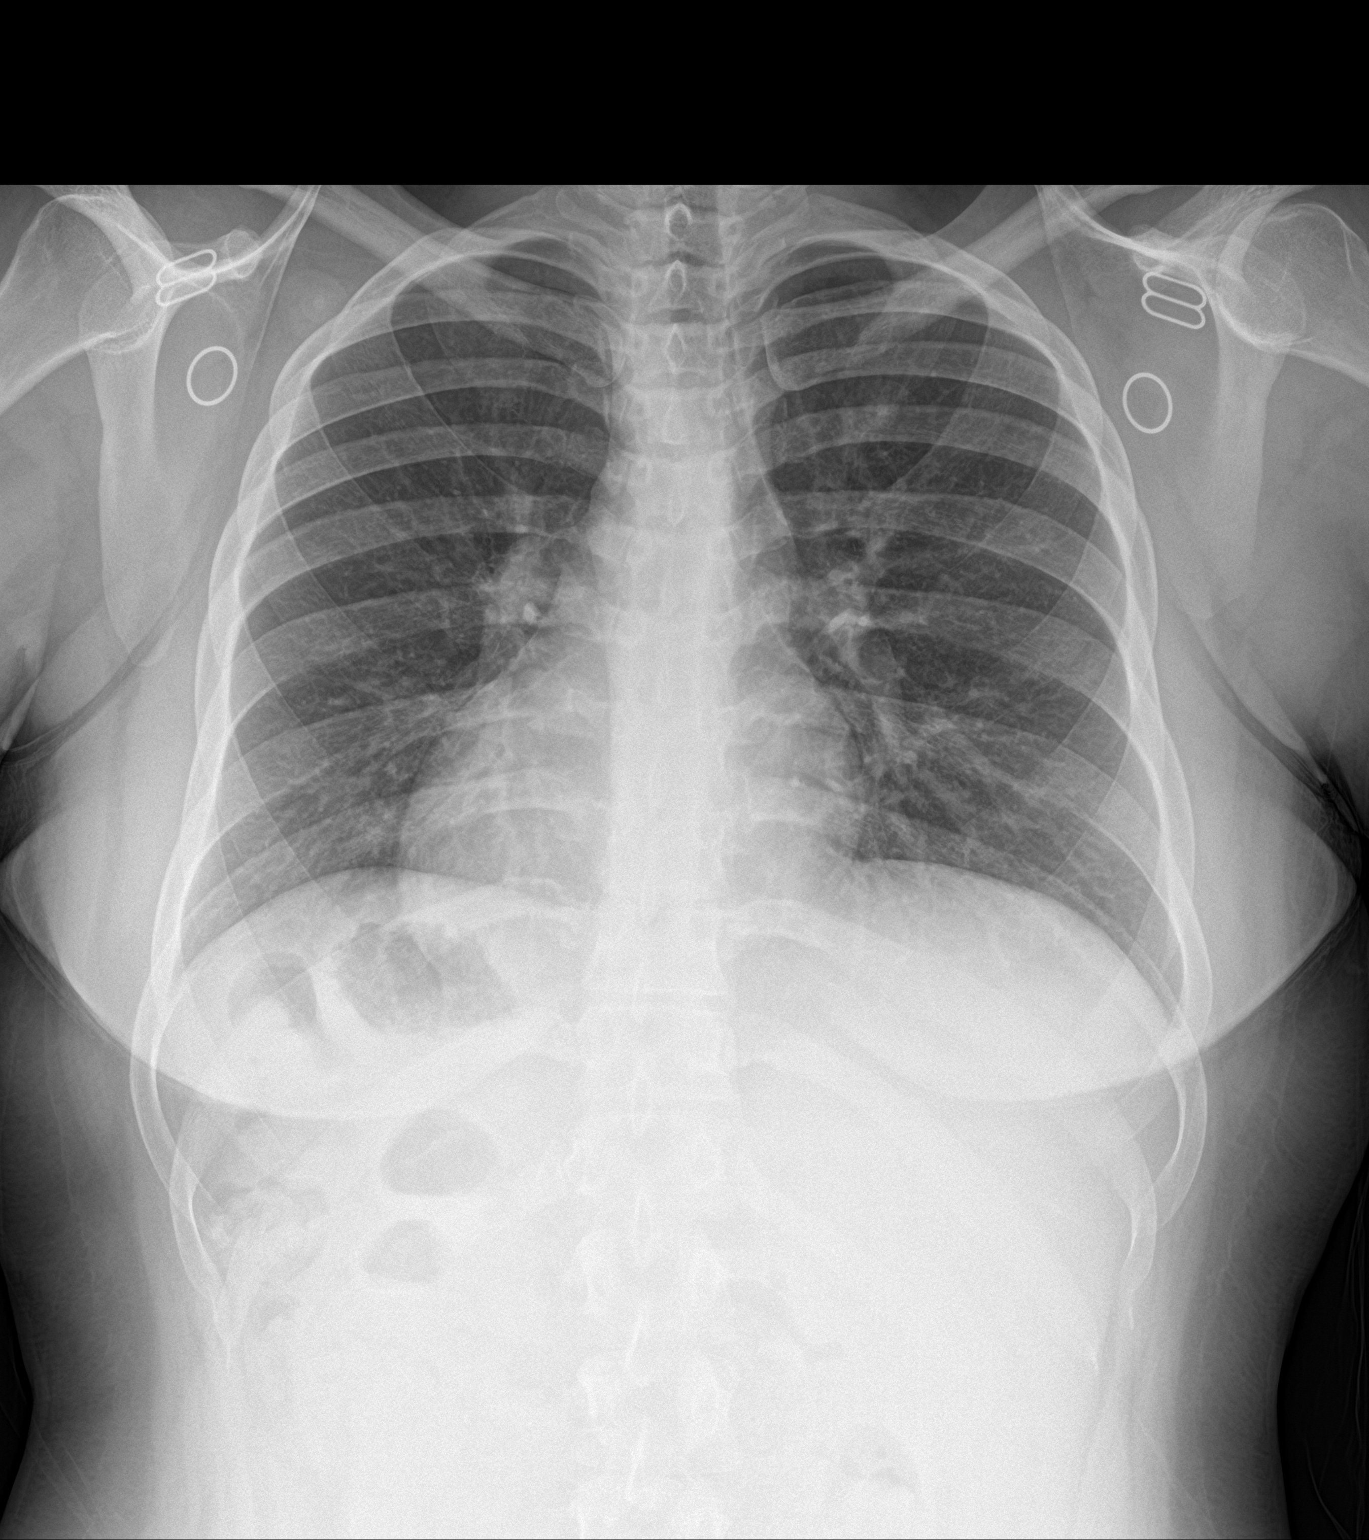

[1 of 1 positions shown; findings below may reference images not displayed]

FINDINGS: The heart size and mediastinal contours are within normal limits.
Both lungs are clear. The visualized skeletal structures are
unremarkable.
IMPRESSION: No active disease.

## 2021-01-13 IMAGING — DX DG ABDOMEN ACUTE W/ 1V CHEST
4 series · 4 of 4 positions shown · non-contrast
Comparison: November 24, 2019

CLINICAL DATA: Abdominal pain and chest pain

EXAM:
DG ABDOMEN ACUTE WITH 1 VIEW CHEST

[chest pa]
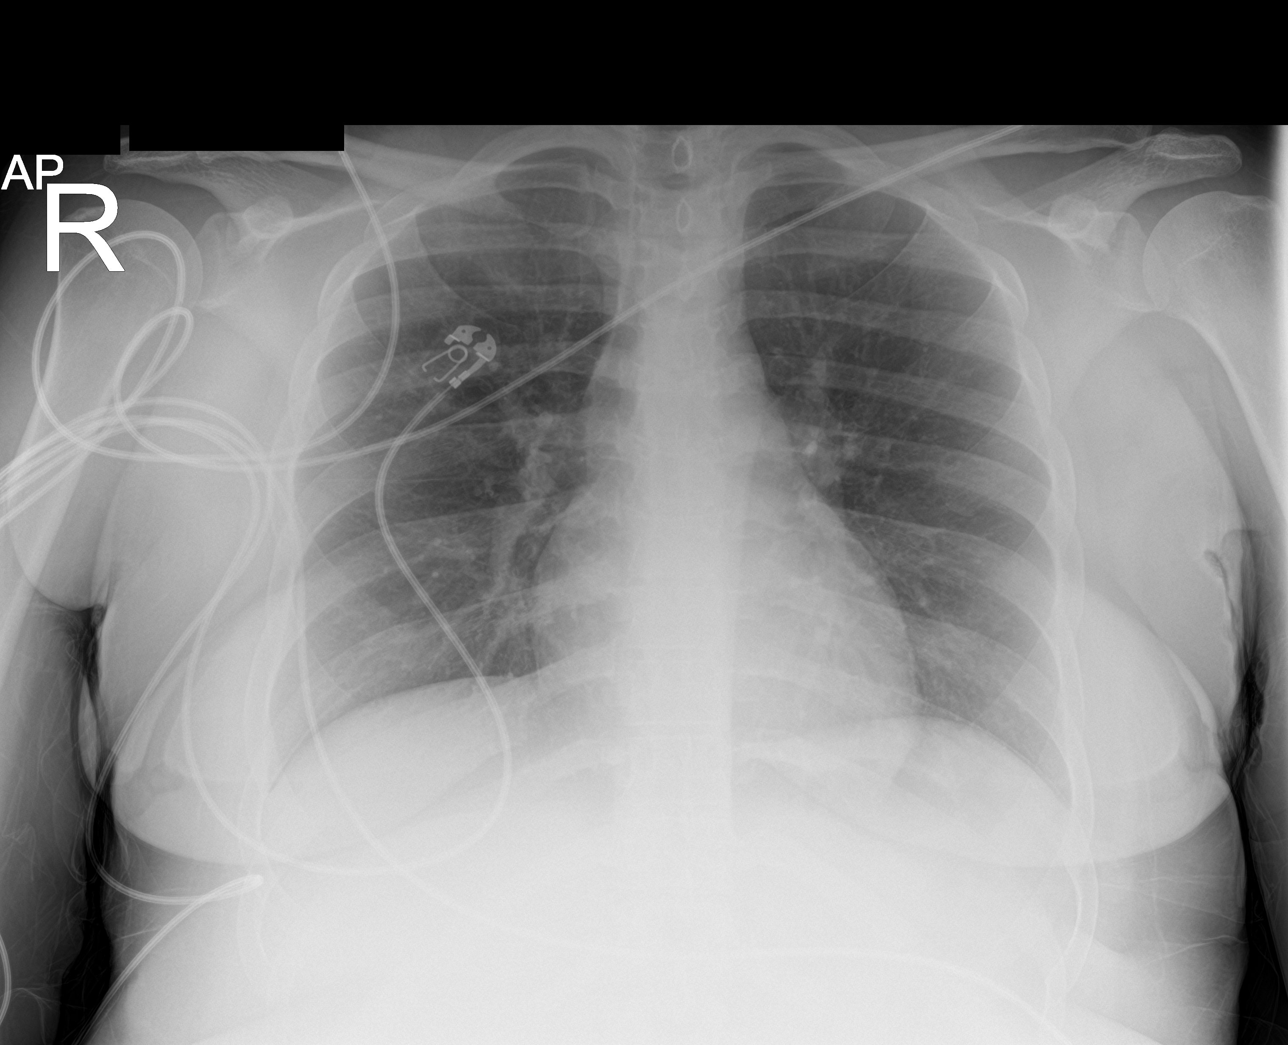

[abdomen erect]
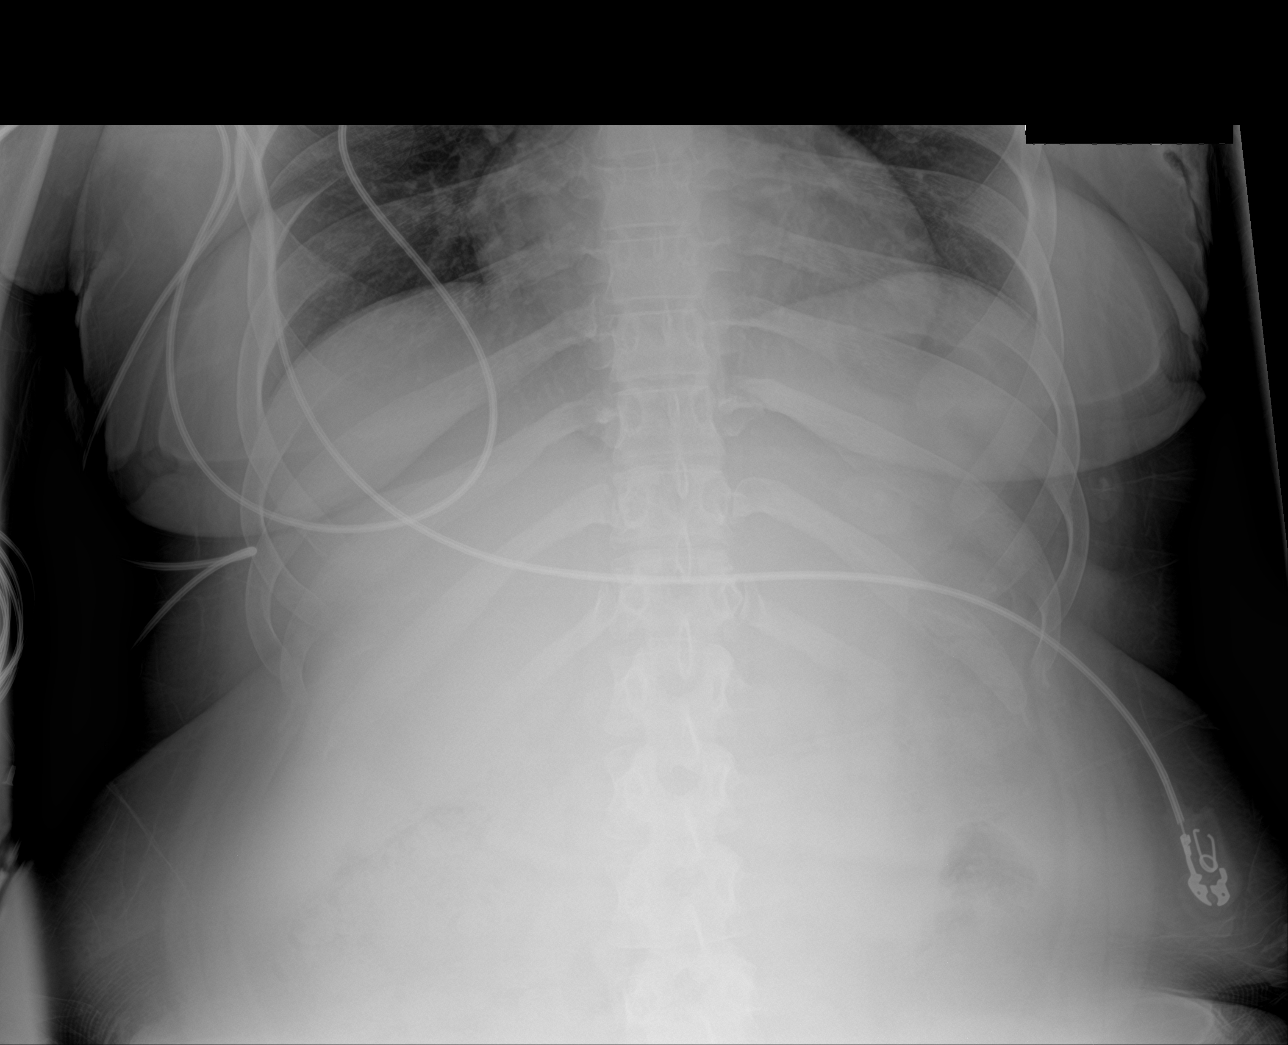

[abdomen supine (1 of 2)]
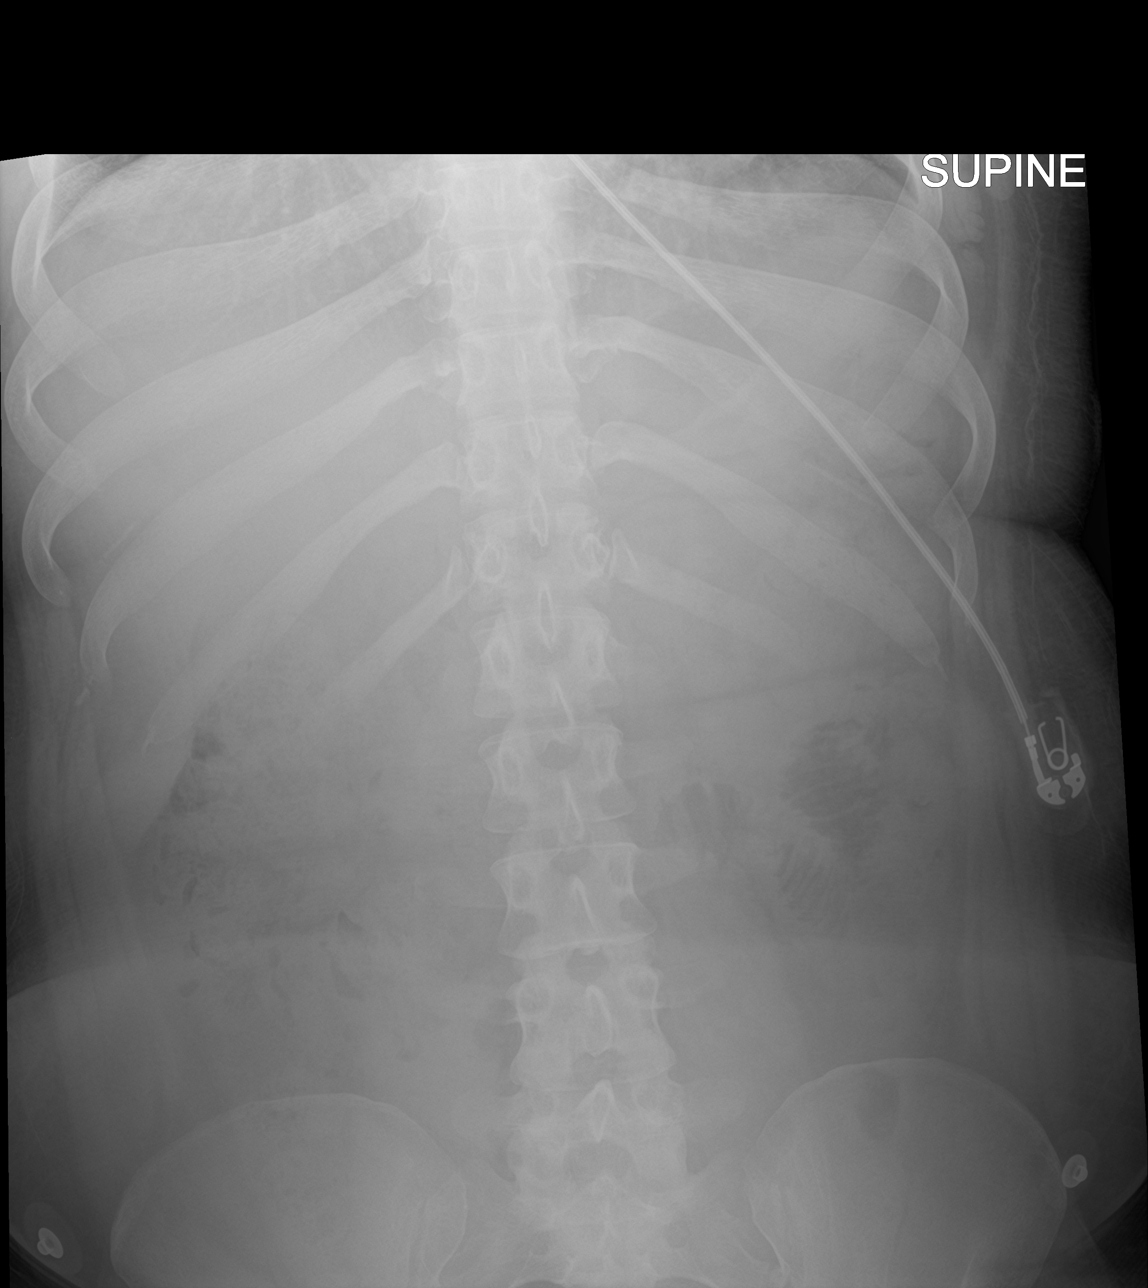

[abdomen supine (2 of 2)]
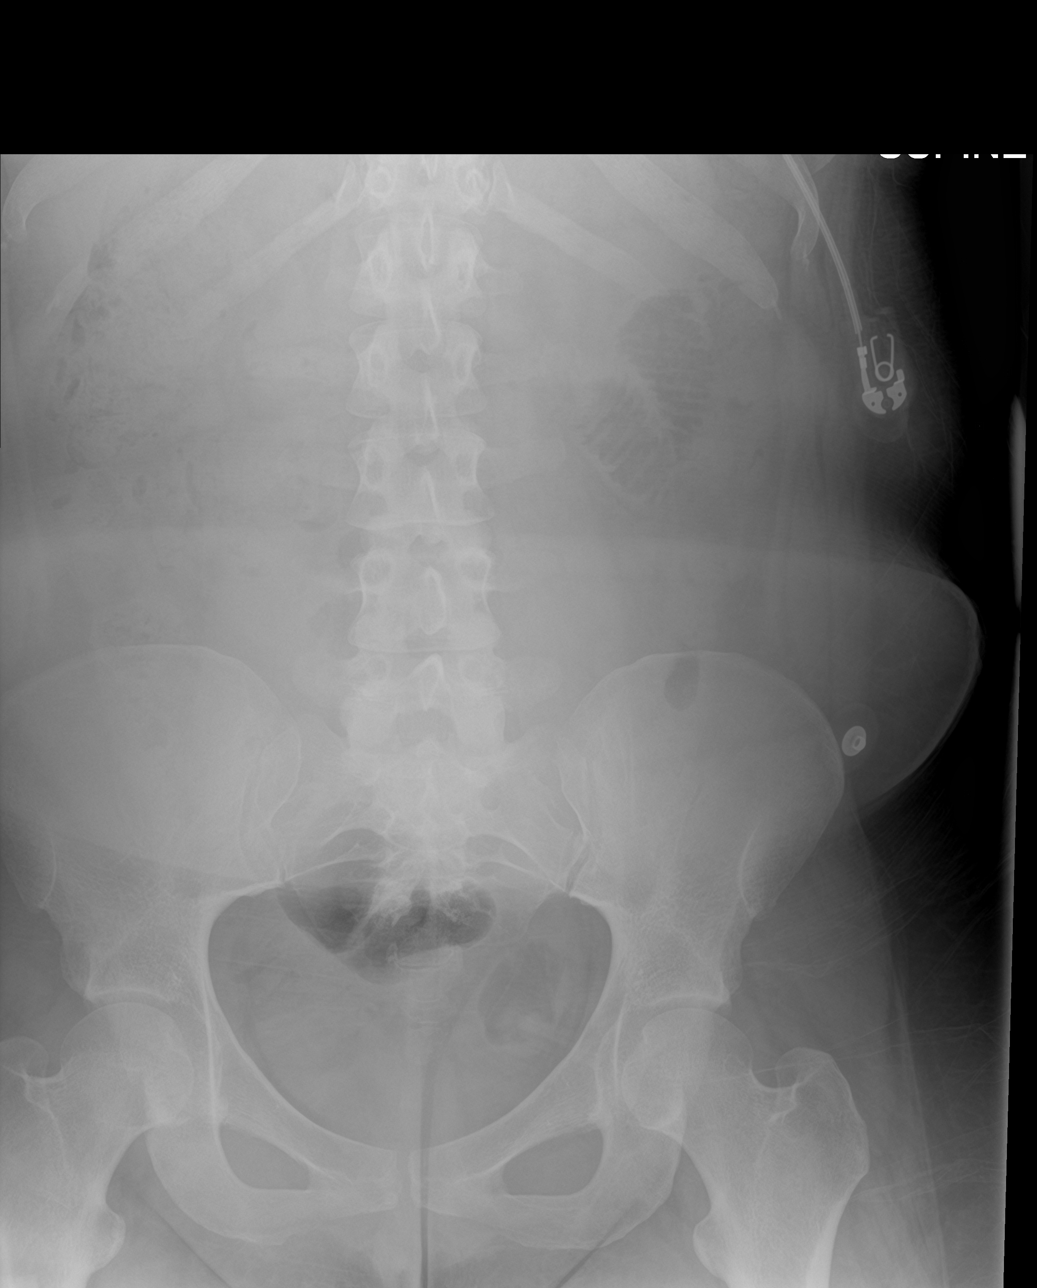

[4 of 4 positions shown; findings below may reference images not displayed]

FINDINGS: The cardiomediastinal silhouette is normal in contour. No pleural
effusion. No pneumothorax. No acute pleuroparenchymal abnormality.

Air and stool filled nondilated loops of bowel. Air is visualized
in the rectum. Moderate colonic stool burden predominately within
the RIGHT hemicolon. No free air. No acute osseous abnormality
noted.
IMPRESSION: 1. No radiographic evidence of bowel obstruction or free air. If
persistent concern, recommend dedicated CT.
2. Moderate colonic stool burden predominately within the RIGHT
hemicolon.
3. No acute cardiopulmonary abnormality.

## 2021-02-08 ENCOUNTER — Other Ambulatory Visit: Payer: Self-pay

## 2021-02-08 ENCOUNTER — Emergency Department
Admission: EM | Admit: 2021-02-08 | Discharge: 2021-02-08 | Disposition: A | Discharge status: Home / Self Care | Payer: Medicaid Other | Attending: Emergency Medicine | Admitting: Emergency Medicine

## 2021-02-08 ENCOUNTER — Emergency Department: Payer: Medicaid Other

## 2021-02-08 DIAGNOSIS — R1111 Vomiting without nausea: Secondary | ICD-10-CM

## 2021-02-08 DIAGNOSIS — J029 Acute pharyngitis, unspecified: Secondary | ICD-10-CM | POA: Diagnosis not present

## 2021-02-08 DIAGNOSIS — E119 Type 2 diabetes mellitus without complications: Secondary | ICD-10-CM | POA: Diagnosis not present

## 2021-02-08 DIAGNOSIS — R112 Nausea with vomiting, unspecified: Secondary | ICD-10-CM | POA: Diagnosis present

## 2021-02-08 DIAGNOSIS — G40909 Epilepsy, unspecified, not intractable, without status epilepticus: Secondary | ICD-10-CM | POA: Diagnosis not present

## 2021-02-08 DIAGNOSIS — R109 Unspecified abdominal pain: Secondary | ICD-10-CM | POA: Insufficient documentation

## 2021-02-08 LAB — COMPREHENSIVE METABOLIC PANEL
ALT: 12 U/L (ref 0–44)
AST: 17 U/L (ref 15–41)
Albumin: 4 g/dL (ref 3.5–5.0)
Alkaline Phosphatase: 81 U/L (ref 38–126)
Anion gap: 8 (ref 5–15)
BUN: 9 mg/dL (ref 6–20)
CO2: 28 mmol/L (ref 22–32)
Calcium: 9 mg/dL (ref 8.9–10.3)
Chloride: 99 mmol/L (ref 98–111)
Creatinine, Ser: 0.59 mg/dL (ref 0.44–1.00)
GFR, Estimated: >60 mL/min (ref >60)
Glucose, Bld: 216 mg/dL — ABNORMAL HIGH (ref 70–99)
Potassium: 3.5 mmol/L (ref 3.5–5.1)
Sodium: 135 mmol/L (ref 135–145)
Total Bilirubin: 0.9 mg/dL (ref 0.3–1.2)
Total Protein: 7.8 g/dL (ref 6.5–8.1)

## 2021-02-08 LAB — CBC
HCT: 37.4 % (ref 36.0–46.0)
Hemoglobin: 11.9 g/dL — ABNORMAL LOW (ref 12.0–15.0)
MCH: 25.2 pg — ABNORMAL LOW (ref 26.0–34.0)
MCHC: 31.8 g/dL (ref 30.0–36.0)
MCV: 79.2 fL — ABNORMAL LOW (ref 80.0–100.0)
Platelets: 338 10*3/uL (ref 150–400)
RBC: 4.72 MIL/uL (ref 3.87–5.11)
RDW: 15.7 % — ABNORMAL HIGH (ref 11.5–15.5)
WBC: 9.4 10*3/uL (ref 4.0–10.5)
nRBC: 0 % (ref 0.0–0.2)

## 2021-02-08 LAB — LIPASE, BLOOD: Lipase: 26 U/L (ref 11–51)

## 2021-02-08 LAB — TROPONIN I (HIGH SENSITIVITY): Troponin I (High Sensitivity): 4 ng/L (ref <18)

## 2021-02-08 MED ORDER — DICYCLOMINE HCL 10 MG PO CAPS
10.0000 mg | ORAL_CAPSULE | Freq: Once | ORAL | Status: AC
  Administered 2021-02-08: 10 mg via ORAL
  Filled 2021-02-08: qty 1

## 2021-02-08 MED ORDER — SODIUM CHLORIDE 0.9 % IV BOLUS
1000.0000 mL | Freq: Once | INTRAVENOUS | Status: AC
  Administered 2021-02-08: 1000 mL via INTRAVENOUS

## 2021-02-08 MED ORDER — PROMETHAZINE HCL 25 MG PO TABS
25.0000 mg | ORAL_TABLET | Freq: Once | ORAL | Status: AC
  Administered 2021-02-08: 25 mg via ORAL
  Filled 2021-02-08: qty 1

## 2021-02-08 MED ORDER — PROMETHAZINE HCL 12.5 MG PO TABS: 12.5000 mg | ORAL_TABLET | Freq: Three times a day (TID) | ORAL | 0 refills | Status: DC | PRN

## 2021-02-08 MED ORDER — DROPERIDOL 2.5 MG/ML IJ SOLN
2.5000 mg | Freq: Once | INTRAMUSCULAR | Status: AC
  Administered 2021-02-08: 2.5 mg via INTRAVENOUS
  Filled 2021-02-08: qty 2

## 2021-02-08 NOTE — ED Notes (Signed)
See triage note. Last meal was Tuesday. Pt has no appetite and states she cant hold down fluids.

## 2021-02-08 NOTE — Discharge Instructions (Addendum)
You can take phenergan for nausea at home.

## 2021-02-08 NOTE — ED Notes (Signed)
E signature pad not working. Pt educated on discharge instructions and verbalized understanding.  

## 2021-02-08 NOTE — ED Triage Notes (Signed)
Pt comes into the ED via [facility] from home c/o seizures.  Pt normally take Keppra with it being controlled.  Pt states that she has had N/V for the past 3-4 days which has prevented her from being able to keep her seizure meds down.    262 CBG 99 oral 96 HR 100% RA 125/75

## 2021-02-08 NOTE — ED Notes (Signed)
Seizure pads placed on bedrails. Pt resting comfortably.

## 2021-02-08 NOTE — ED Notes (Signed)
IV team attempted x2 to find a line on pt with no success.

## 2021-02-08 NOTE — ED Provider Notes (Signed)
[facility] Provider Note  Patient Contact: 4:07 PM (approximate)   History   Seizures and Abdominal Pain   HPI  Nancy [name] is a 21 y.o. female with a history of cannabinoid hyperemesis syndrome, pseudoseizures, cyclic vomiting disorder, seizures and diabetes presents to the emergency department with concern for nausea and vomiting that is occurred for the past 3 to 4 days.  Patient endorses associated allover abdominal discomfort that does not seem to radiate.  Patient states that she has had similar pain multiple times in the past and has been worked up extensively both in the emergency department setting and with GI and states that her GI doc is uncertain what is causing her pain.  She states that she has been prescribed antiemetics which have not been helping her.  She denies associated diarrhea, fever, nasal congestion or cough.  She states that she does have a sore throat but thinks it is associated with her vomiting.  Patient states that she has not been able to tolerate her 150 mg of daily Keppra and her seizure-like activity has increased from 6 daily seizures to 12.  She states that she is well followed by neurology and they recently adjusted her Keppra from 100 to 150 mg as of December.  Neurology is not quite certain why she continues to have as many daily seizures as she does despite reassuring work-up.      Physical Exam   Triage Vital Signs: ED Triage Vitals [02/08/21 1405]  Enc Vitals Group     BP 124/88     Pulse Rate 99     Resp 18     Temp 98.7 F (37.1 C)     Temp Source Oral     SpO2 98 %     Weight 195 lb (88.5 kg)     Height 5\' 3"  (1.6 m)     Head Circumference      Peak Flow      Pain Score 9     Pain Loc      Pain Edu?      Excl. in GC?     Most recent vital signs: Vitals:   02/08/21 1405 02/08/21 1827  BP: 124/88 131/88  Pulse: 99 91  Resp: 18 18  Temp: 98.7 F (37.1 C)   SpO2: 98% 100%     General: Alert and  in no acute distress. Eyes:  PERRL. EOMI. Head: No acute traumatic findings ENT:      Ears:       Nose: No congestion/rhinnorhea.      Mouth/Throat: Mucous membranes are moist.  Neck: No stridor. No cervical spine tenderness to palpation. Cardiovascular:  Good peripheral perfusion Respiratory: Normal respiratory effort without tachypnea or retractions. Lungs CTAB. Good air entry to the bases with no decreased or absent breath sounds. Gastrointestinal: Bowel sounds 4 quadrants. Soft and nontender to palpation. No guarding or rigidity. No palpable masses. No distention. No CVA tenderness. Musculoskeletal: Full range of motion to all extremities.  Neurologic:  No gross focal neurologic deficits are appreciated.  Skin:   No rash noted Other:   ED Results / Procedures / Treatments   Labs (all labs ordered are listed, but only abnormal results are displayed) Labs Reviewed  COMPREHENSIVE METABOLIC PANEL - Abnormal; Notable for the following components:      Result Value   Glucose, Bld 216 (*)    All other components within normal limits  CBC - Abnormal; Notable for the following  components:   Hemoglobin 11.9 (*)    MCV 79.2 (*)    MCH 25.2 (*)    RDW 15.7 (*)    All other components within normal limits  RESP PANEL BY RT-PCR (FLU A&B, COVID) ARPGX2  GROUP A STREP BY PCR  LIPASE, BLOOD  URINALYSIS, ROUTINE W REFLEX MICROSCOPIC  LEVETIRACETAM LEVEL  POC URINE PREG, ED  TROPONIN I (HIGH SENSITIVITY)  TROPONIN I (HIGH SENSITIVITY)     EKG  Normal sinus rhythm without ST segment elevation or other apparent arrhythmia.   RADIOLOGY  I personally viewed and evaluated these images as part of my medical decision making, as well as reviewing the written report by the radiologist.  ED Provider Interpretation: I personally reviewed chest x-ray and there were no consolidations, opacities, infiltrates or evidence of pneumothorax.   PROCEDURES:  Critical Care performed:  No  Procedures   MEDICATIONS ORDERED IN ED: Medications  sodium chloride 0.9 % bolus 1,000 mL (1,000 mLs Intravenous New Bag/Given 02/08/21 1653)  droperidol (INAPSINE) 2.5 MG/ML injection 2.5 mg (2.5 mg Intravenous Given 02/08/21 1659)  promethazine (PHENERGAN) tablet 25 mg (25 mg Oral Given 02/08/21 1800)  dicyclomine (BENTYL) capsule 10 mg (10 mg Oral Given 02/08/21 1800)     IMPRESSION / MDM / ASSESSMENT AND PLAN / ED COURSE  I reviewed the triage vital signs and the nursing notes.                              Differential diagnosis includes, but is not limited to cyclic vomiting disorder, cannabinoid hyperemesis syndrome, seizure-like activity, pseudoseizures, electrolyte abnormality, infection, UTI, gastritis...  Assessment and plan Nausea Vomiting 21 year old female presents to the emergency department complaining of allover abdominal discomfort, nausea and vomiting.  Patient also endorses increased seizure-like activity at home stating that she normally has sick seizures at home daily and is up to 12 according to her mom.  She states that she has not been able to keep her Keppra down due to vomiting.  Vital signs were reassuring at triage.  On physical exam, patient was alert and nontoxic-appearing.  Her mucous membranes appeared moist and she was able to transition easily from a standing to a supine position.  Her abdomen was tender to palpation but not in a specific area and she had no guarding.  Patient had a normal white blood cell count and there were no electrolyte abnormalities on CMP.  Lipase was within reference range.  Troponin and Keppra level in process.  Will obtain group A strep and COVID-19/flu testing, although suspicion for infection is low.  Patient has responded well with similar symptoms in the past with droperidol and supplemental fluids.  Will give normal saline bolus and droperidol and will reassess.  Patient's troponin level was within reference range.  Patient  received Reglan, Bentyl, droperidol and supplemental fluids and stated that she needed to leave the emergency department.  COVID-19, influenza and group A strep testing did not take place due to patient's request for discharge. Return precautions were given to return with new or worsening symptoms.    Clinical Course as of 02/08/21 1829  Fri Feb 08, 2021  1559 RDW(!): 15.7 [JW]    Clinical Course User Index [JW] Orvil Feil, [place]     FINAL CLINICAL IMPRESSION(S) / ED DIAGNOSES   Final diagnoses:  Vomiting without nausea, unspecified vomiting type     Rx / DC Orders   ED Discharge  Orders          Ordered    promethazine (PHENERGAN) 12.5 MG tablet  Every 8 hours PRN        02/08/21 1821             Note:  This document was prepared using Dragon voice recognition software and may include unintentional dictation errors.   Pia Mau [place], PA-C 02/08/21 Thurston Pounds, MD 02/14/21 [phone]

## 2021-02-09 LAB — LEVETIRACETAM LEVEL: Levetiracetam Lvl: <2 ug/mL — ABNORMAL LOW (ref 10.0–40.0)

## 2021-02-12 ENCOUNTER — Encounter: Payer: Self-pay | Admitting: Emergency Medicine

## 2021-02-12 ENCOUNTER — Observation Stay
Admission: EM | Admit: 2021-02-12 | Discharge: 2021-02-13 | Disposition: A | Discharge status: Home / Self Care | Payer: Medicaid Other | Attending: Internal Medicine | Admitting: Internal Medicine

## 2021-02-12 ENCOUNTER — Other Ambulatory Visit: Payer: Self-pay

## 2021-02-12 ENCOUNTER — Emergency Department: Payer: Medicaid Other

## 2021-02-12 DIAGNOSIS — E876 Hypokalemia: Secondary | ICD-10-CM | POA: Diagnosis present

## 2021-02-12 DIAGNOSIS — X58XXXA Exposure to other specified factors, initial encounter: Secondary | ICD-10-CM | POA: Insufficient documentation

## 2021-02-12 DIAGNOSIS — N6452 Nipple discharge: Secondary | ICD-10-CM | POA: Insufficient documentation

## 2021-02-12 DIAGNOSIS — I110 Hypertensive heart disease with heart failure: Secondary | ICD-10-CM | POA: Insufficient documentation

## 2021-02-12 DIAGNOSIS — K5900 Constipation, unspecified: Secondary | ICD-10-CM | POA: Diagnosis not present

## 2021-02-12 DIAGNOSIS — Z20822 Contact with and (suspected) exposure to covid-19: Secondary | ICD-10-CM | POA: Insufficient documentation

## 2021-02-12 DIAGNOSIS — Z79899 Other long term (current) drug therapy: Secondary | ICD-10-CM | POA: Diagnosis not present

## 2021-02-12 DIAGNOSIS — S0990XA Unspecified injury of head, initial encounter: Secondary | ICD-10-CM | POA: Diagnosis not present

## 2021-02-12 DIAGNOSIS — Y92009 Unspecified place in unspecified non-institutional (private) residence as the place of occurrence of the external cause: Secondary | ICD-10-CM | POA: Insufficient documentation

## 2021-02-12 DIAGNOSIS — F17200 Nicotine dependence, unspecified, uncomplicated: Secondary | ICD-10-CM

## 2021-02-12 DIAGNOSIS — E1065 Type 1 diabetes mellitus with hyperglycemia: Secondary | ICD-10-CM | POA: Insufficient documentation

## 2021-02-12 DIAGNOSIS — N3000 Acute cystitis without hematuria: Secondary | ICD-10-CM | POA: Insufficient documentation

## 2021-02-12 DIAGNOSIS — R1084 Generalized abdominal pain: Secondary | ICD-10-CM

## 2021-02-12 DIAGNOSIS — R55 Syncope and collapse: Secondary | ICD-10-CM

## 2021-02-12 DIAGNOSIS — R112 Nausea with vomiting, unspecified: Secondary | ICD-10-CM | POA: Diagnosis not present

## 2021-02-12 DIAGNOSIS — E1143 Type 2 diabetes mellitus with diabetic autonomic (poly)neuropathy: Secondary | ICD-10-CM | POA: Diagnosis present

## 2021-02-12 DIAGNOSIS — K3184 Gastroparesis: Secondary | ICD-10-CM | POA: Diagnosis present

## 2021-02-12 DIAGNOSIS — I152 Hypertension secondary to endocrine disorders: Secondary | ICD-10-CM | POA: Diagnosis present

## 2021-02-12 DIAGNOSIS — I10 Essential (primary) hypertension: Secondary | ICD-10-CM | POA: Diagnosis present

## 2021-02-12 DIAGNOSIS — I509 Heart failure, unspecified: Secondary | ICD-10-CM | POA: Insufficient documentation

## 2021-02-12 DIAGNOSIS — R8271 Bacteriuria: Secondary | ICD-10-CM | POA: Diagnosis present

## 2021-02-12 DIAGNOSIS — D509 Iron deficiency anemia, unspecified: Secondary | ICD-10-CM | POA: Diagnosis present

## 2021-02-12 DIAGNOSIS — R000 Tachycardia, unspecified: Secondary | ICD-10-CM | POA: Diagnosis present

## 2021-02-12 DIAGNOSIS — L0291 Cutaneous abscess, unspecified: Secondary | ICD-10-CM

## 2021-02-12 DIAGNOSIS — R739 Hyperglycemia, unspecified: Secondary | ICD-10-CM

## 2021-02-12 DIAGNOSIS — E1043 Type 1 diabetes mellitus with diabetic autonomic (poly)neuropathy: Secondary | ICD-10-CM | POA: Insufficient documentation

## 2021-02-12 DIAGNOSIS — Z794 Long term (current) use of insulin: Secondary | ICD-10-CM | POA: Diagnosis not present

## 2021-02-12 DIAGNOSIS — L02214 Cutaneous abscess of groin: Secondary | ICD-10-CM | POA: Diagnosis present

## 2021-02-12 DIAGNOSIS — E109 Type 1 diabetes mellitus without complications: Secondary | ICD-10-CM | POA: Diagnosis present

## 2021-02-12 DIAGNOSIS — K5909 Other constipation: Secondary | ICD-10-CM | POA: Diagnosis present

## 2021-02-12 LAB — COMPREHENSIVE METABOLIC PANEL
ALT: 13 U/L (ref 0–44)
AST: 13 U/L — ABNORMAL LOW (ref 15–41)
Albumin: 3.9 g/dL (ref 3.5–5.0)
Alkaline Phosphatase: 70 U/L (ref 38–126)
Anion gap: 8 (ref 5–15)
BUN: 9 mg/dL (ref 6–20)
CO2: 28 mmol/L (ref 22–32)
Calcium: 9.1 mg/dL (ref 8.9–10.3)
Chloride: 99 mmol/L (ref 98–111)
Creatinine, Ser: 0.58 mg/dL (ref 0.44–1.00)
GFR, Estimated: >60 mL/min (ref >60)
Glucose, Bld: 220 mg/dL — ABNORMAL HIGH (ref 70–99)
Potassium: 3.2 mmol/L — ABNORMAL LOW (ref 3.5–5.1)
Sodium: 135 mmol/L (ref 135–145)
Total Bilirubin: 0.8 mg/dL (ref 0.3–1.2)
Total Protein: 7.4 g/dL (ref 6.5–8.1)

## 2021-02-12 LAB — LIPASE, BLOOD: Lipase: 29 U/L (ref 11–51)

## 2021-02-12 LAB — RESP PANEL BY RT-PCR (FLU A&B, COVID) ARPGX2
Influenza A by PCR: NEGATIVE
Influenza B by PCR: NEGATIVE
SARS Coronavirus 2 by RT PCR: NEGATIVE

## 2021-02-12 LAB — URINALYSIS, ROUTINE W REFLEX MICROSCOPIC
Bilirubin Urine: NEGATIVE
Glucose, UA: NEGATIVE mg/dL
Hgb urine dipstick: NEGATIVE
Ketones, ur: 5 mg/dL — AB
Nitrite: NEGATIVE
Protein, ur: 30 mg/dL — AB
Specific Gravity, Urine: 1.021 (ref 1.005–1.030)
pH: 6 (ref 5.0–8.0)

## 2021-02-12 LAB — MAGNESIUM: Magnesium: 2.1 mg/dL (ref 1.7–2.4)

## 2021-02-12 LAB — GLUCOSE, CAPILLARY: Glucose-Capillary: 131 mg/dL — ABNORMAL HIGH (ref 70–99)

## 2021-02-12 LAB — BETA-HYDROXYBUTYRIC ACID: Beta-Hydroxybutyric Acid: 0.29 mmol/L — ABNORMAL HIGH (ref 0.05–0.27)

## 2021-02-12 LAB — CBC
HCT: 36 % (ref 36.0–46.0)
Hemoglobin: 11.7 g/dL — ABNORMAL LOW (ref 12.0–15.0)
MCH: 25.5 pg — ABNORMAL LOW (ref 26.0–34.0)
MCHC: 32.5 g/dL (ref 30.0–36.0)
MCV: 78.6 fL — ABNORMAL LOW (ref 80.0–100.0)
Platelets: 350 10*3/uL (ref 150–400)
RBC: 4.58 MIL/uL (ref 3.87–5.11)
RDW: 15.2 % (ref 11.5–15.5)
WBC: 8.7 10*3/uL (ref 4.0–10.5)
nRBC: 0 % (ref 0.0–0.2)

## 2021-02-12 LAB — HCG, QUANTITATIVE, PREGNANCY: hCG, Beta Chain, Quant, S: <1 m[IU]/mL (ref <5)

## 2021-02-12 LAB — POC URINE PREG, ED: Preg Test, Ur: NEGATIVE

## 2021-02-12 MED ORDER — METOCLOPRAMIDE HCL 5 MG/ML IJ SOLN
10.0000 mg | Freq: Once | INTRAMUSCULAR | Status: AC
Start: 2021-02-12 — End: 2021-02-12
  Administered 2021-02-12: 10 mg via INTRAVENOUS
  Filled 2021-02-12: qty 2

## 2021-02-12 MED ORDER — NICOTINE 14 MG/24HR TD PT24
14.0000 mg | MEDICATED_PATCH | Freq: Every day | TRANSDERMAL | Status: DC
  Administered 2021-02-12 – 2021-02-13 (×2): 14 mg via TRANSDERMAL
  Filled 2021-02-12 (×2): qty 1

## 2021-02-12 MED ORDER — VANCOMYCIN HCL 2000 MG/400ML IV SOLN
2000.0000 mg | Freq: Once | INTRAVENOUS | Status: AC
Start: 2021-02-12 — End: 2021-02-12
  Administered 2021-02-12: 2000 mg via INTRAVENOUS
  Filled 2021-02-12: qty 400

## 2021-02-12 MED ORDER — POTASSIUM CHLORIDE IN NACL 20-0.9 MEQ/L-% IV SOLN
INTRAVENOUS | Status: DC
  Filled 2021-02-12 (×6): qty 1000

## 2021-02-12 MED ORDER — LACTATED RINGERS IV BOLUS
1000.0000 mL | Freq: Once | INTRAVENOUS | Status: AC
  Administered 2021-02-12: 1000 mL via INTRAVENOUS

## 2021-02-12 MED ORDER — IOHEXOL 300 MG/ML  SOLN
100.0000 mL | Freq: Once | INTRAMUSCULAR | Status: AC | PRN
  Administered 2021-02-12: 100 mL via INTRAVENOUS

## 2021-02-12 MED ORDER — VANCOMYCIN HCL 750 MG/150ML IV SOLN
750.0000 mg | Freq: Two times a day (BID) | INTRAVENOUS | Status: DC
  Filled 2021-02-12: qty 150

## 2021-02-12 MED ORDER — CEFTRIAXONE SODIUM 1 G IJ SOLR
1.0000 g | Freq: Once | INTRAMUSCULAR | Status: AC
  Administered 2021-02-12: 1 g via INTRAVENOUS
  Filled 2021-02-12: qty 10

## 2021-02-12 MED ORDER — LACTULOSE ENEMA
300.0000 mL | Freq: Two times a day (BID) | ORAL | Status: DC
  Filled 2021-02-12 (×3): qty 300

## 2021-02-12 MED ORDER — VANCOMYCIN HCL 750 MG/150ML IV SOLN
750.0000 mg | Freq: Two times a day (BID) | INTRAVENOUS | Status: DC
  Administered 2021-02-13: 750 mg via INTRAVENOUS
  Filled 2021-02-12 (×2): qty 150

## 2021-02-12 MED ORDER — INSULIN GLARGINE 100 UNIT/ML SOLOSTAR PEN: 60.0000 [IU] | PEN_INJECTOR | Freq: Every day | SUBCUTANEOUS | Status: DC

## 2021-02-12 MED ORDER — ONDANSETRON HCL 4 MG/2ML IJ SOLN: 4.0000 mg | Freq: Four times a day (QID) | INTRAMUSCULAR | Status: DC | PRN

## 2021-02-12 MED ORDER — POTASSIUM CHLORIDE 10 MEQ/100ML IV SOLN
10.0000 meq | INTRAVENOUS | Status: AC
  Administered 2021-02-12 (×4): 10 meq via INTRAVENOUS
  Filled 2021-02-12 (×3): qty 100

## 2021-02-12 MED ORDER — INSULIN GLARGINE-YFGN 100 UNIT/ML ~~LOC~~ SOLN
60.0000 [IU] | Freq: Every day | SUBCUTANEOUS | Status: DC
  Filled 2021-02-12 (×2): qty 0.6

## 2021-02-12 MED ORDER — ACETAMINOPHEN 650 MG RE SUPP: 650.0000 mg | Freq: Four times a day (QID) | RECTAL | Status: DC | PRN

## 2021-02-12 MED ORDER — METOCLOPRAMIDE HCL 5 MG/ML IJ SOLN
10.0000 mg | Freq: Four times a day (QID) | INTRAMUSCULAR | Status: DC
  Administered 2021-02-12 – 2021-02-13 (×4): 10 mg via INTRAVENOUS
  Filled 2021-02-12 (×4): qty 2

## 2021-02-12 MED ORDER — PANTOPRAZOLE SODIUM 40 MG IV SOLR
40.0000 mg | INTRAVENOUS | Status: DC
  Administered 2021-02-12: 40 mg via INTRAVENOUS
  Filled 2021-02-12: qty 10

## 2021-02-12 MED ORDER — DROPERIDOL 2.5 MG/ML IJ SOLN
2.5000 mg | Freq: Once | INTRAMUSCULAR | Status: AC
  Administered 2021-02-12: 2.5 mg via INTRAVENOUS
  Filled 2021-02-12: qty 2

## 2021-02-12 MED ORDER — METOPROLOL TARTRATE 5 MG/5ML IV SOLN
5.0000 mg | Freq: Three times a day (TID) | INTRAVENOUS | Status: DC
  Administered 2021-02-12 – 2021-02-13 (×4): 5 mg via INTRAVENOUS
  Filled 2021-02-12 (×4): qty 5

## 2021-02-12 MED ORDER — ACETAMINOPHEN 325 MG PO TABS: 650.0000 mg | ORAL_TABLET | Freq: Four times a day (QID) | ORAL | Status: DC | PRN

## 2021-02-12 MED ORDER — ONDANSETRON HCL 4 MG PO TABS: 4.0000 mg | ORAL_TABLET | Freq: Four times a day (QID) | ORAL | Status: DC | PRN

## 2021-02-12 NOTE — Consult Note (Signed)
PHARMACY -  BRIEF ANTIBIOTIC NOTE   Pharmacy has received consult(s) for Vancomycin from an ED provider.  The patient's profile has been reviewed for ht/wt/allergies/indication/available labs.    One time order(s) placed for Vancomycin 2g IV x 1 dose.  Further antibiotics/pharmacy consults should be ordered by admitting physician if indicated.                       Thank you, Bettey Costa 02/12/2021  3:01 PM

## 2021-02-12 NOTE — H&P (Signed)
History and Physical    Patient: Nancy [name] [id number] DOB: 06-08-2000 DOA: 02/12/2021 DOS: the patient was seen and examined on 02/12/2021 PCP: Nolene Ebbs, MD  Patient coming from: Home  Chief Complaint:  Chief Complaint  Patient presents with   Abdominal Pain   HPI: Nancy [name] is a 21 y.o. female with medical history significant of acanthosis nigricans, anxiety, depression, unspecified CHF, type I DM, history of type I DKA, dyspepsia, obesity, ovarian cyst, precocious adrenarche, history of premature delivery, history of pseudoseizures who is coming to the emergency department due to acute on chronic nausea with over 20 episodes of emesis in the past 2 days.  No diarrhea, melena or hematochezia.  She has not had a bowel movement in weeks.  No flank pain, dysuria, frequency or hematuria.  No fever, chills, sore throat, rhinorrhea, wheezing, productive cough, dyspnea or hemoptysis.  No chest pain, palpitations, diaphoresis, PND, orthopnea or pitting edema of the lower extremities.  No polyuria, polydipsia, polyphagia or blurry vision.  ED: Initial vital signs temperature 99.6 F, pulse 97, respirations 16, BP 137/93 mmHg O2 sat 100% on room air.  The patient received 1000 mL of LR bolus, KCl 10 mEq x 4 IVPB, metoclopramide 10 mg IVP, droperidol 2.5 mg IVP, vancomycin 2000 mg IVPB and ceftriaxone 1 g IVPB.  Labwork: Urinalysis showed ketonuria 05 and proteinuria 30 mg/dL.  CBC with a white count of 8.7, hemoglobin 11.7 g/dL platelets 350.  Lipase, magnesium, influenza and COVID-19 PCR were negative.  CMP showed a potassium of 3.2 mmol/L and a glucose of 220 mg/dL.  The rest of the CMP measurements were within expected range  Imaging: CT abdomen/pelvis with contrast show a 30 mm mass superficially in the left groin suspicious for necrotic adenopathy or an abscess.  No intra-abdominal adenopathy or acute inflammatory changes identified.  There is mild bladder wall thickening, likely  due to incomplete distention.  There is a stable small left adrenal adenoma.  Please see images and full radiology report for further details.  Review of Systems: As mentioned in the history of present illness. All other systems reviewed and are negative. Past Medical History:  Diagnosis Date   Acanthosis nigricans    Anxiety    CHF (congestive heart failure) (HCC)    Chronic lower back pain    Depression    DKA, type 1 ([place]) 09/13/2018   Dyspepsia    Obesity    Ovarian cyst    pt is not aware of this hx (11/24/2017)   Precocious adrenarche ([place])    Premature baby    Seizures ([place])    Type II diabetes mellitus ([place])    insulin dependant   Past Surgical History:  Procedure Laterality Date   ABDOMINAL HERNIA REPAIR     "I was a baby"   BIOPSY  10/12/2018   Procedure: BIOPSY;  Surgeon: Jackquline Denmark, MD;  Location: [facility] ENDOSCOPY;  Service: Endoscopy;;   BIOPSY  02/28/2020   Procedure: BIOPSY;  Surgeon: Lavena Bullion, DO;  Location: [facility] ENDOSCOPY;  Service: Gastroenterology;;   ESOPHAGOGASTRODUODENOSCOPY (EGD) WITH PROPOFOL N/A 10/12/2018   Procedure: ESOPHAGOGASTRODUODENOSCOPY (EGD) WITH PROPOFOL;  Surgeon: Jackquline Denmark, MD;  Location: [facility] ENDOSCOPY;  Service: Endoscopy;  Laterality: N/A;   ESOPHAGOGASTRODUODENOSCOPY (EGD) WITH PROPOFOL N/A 02/28/2020   Procedure: ESOPHAGOGASTRODUODENOSCOPY (EGD) WITH PROPOFOL;  Surgeon: Lavena Bullion, DO;  Location: [place];  Service: Gastroenterology;  Laterality: N/A;   FLEXIBLE SIGMOIDOSCOPY N/A 02/28/2020   Procedure: FLEXIBLE SIGMOIDOSCOPY;  Surgeon: Bryan Lemma,  Dominic Pea, DO;  Location: [place] ENDOSCOPY;  Service: Gastroenterology;  Laterality: N/A;   HERNIA REPAIR     LEFT HEART CATH AND CORONARY ANGIOGRAPHY N/A 10/13/2018   Procedure: LEFT HEART CATH AND CORONARY ANGIOGRAPHY;  Surgeon: Burnell Blanks, MD;  Location: [place] CV LAB;  Service: Cardiovascular;  Laterality: N/A;   TONSILLECTOMY AND ADENOIDECTOMY     WISDOM TOOTH  EXTRACTION  2017   Social History:  reports that she has never smoked. She has never used smokeless tobacco. She reports that she does not drink alcohol and does not use drugs.  Allergies  Allergen Reactions   Tomato Anaphylaxis   Ibuprofen Other (See Comments)    GI MD said to not take this anymore   Oatmeal Rash    Family History  Problem Relation Age of Onset   Diabetes Mother    Hypertension Mother    Obesity Mother    Asthma Mother    Allergic rhinitis Mother    Eczema Mother    Cervical cancer Mother    Diabetes Father    Hypertension Father    Obesity Father    Hyperlipidemia Father    Hypertension Paternal Aunt    Hypertension Maternal Grandfather    Colon cancer Maternal Grandfather    Diabetes Paternal Grandmother    Obesity Paternal Grandmother    Diabetes Paternal Grandfather    Obesity Paternal Grandfather    Angioedema Neg Hx    Immunodeficiency Neg Hx    Urticaria Neg Hx    Stomach cancer Neg Hx    Esophageal cancer Neg Hx     Prior to Admission medications   Medication Sig Start Date End Date Taking? Authorizing Provider  ARIPiprazole (ABILIFY) 10 MG tablet Take 10 mg by mouth in the morning and at bedtime.     [provider]  atorvastatin (LIPITOR) 20 MG tablet Take 1 tablet (20 mg total) by mouth daily at 6 PM. Patient taking differently: Take 20 mg by mouth daily. 10/05/18   Elsie Stain, MD  capsaicin (ZOSTRIX) 0.025 % cream Apply topically 2 (two) times daily. 02/29/20   Ghimire, Henreitta Leber, MD  capsaicin (ZOSTRIX) 0.025 % cream APPLY TOPICALLY TWO TIMES DAILY. Patient not taking: Reported on 07/22/2020 02/29/20 02/28/21  Jonetta Osgood, MD  diclofenac Sodium (VOLTAREN) 1 % GEL Apply 1 application topically 2 (two) times daily as needed (chest pains).    [provider]  insulin aspart (NOVOLOG FLEXPEN) 100 UNIT/ML FlexPen Inject 15 Units into the skin 2 (two) times daily before a meal.    [provider]  insulin  glargine (LANTUS) 100 UNIT/ML Solostar Pen INJECT 30 UNITS INTO THE SKIN AT BEDTIME Patient taking differently: Inject 60 Units into the skin at bedtime. 02/29/20 02/28/21  Ghimire, Henreitta Leber, MD  insulin glargine (LANTUS) 100 unit/mL SOPN Inject 30 Units into the skin at bedtime. Patient not taking: Reported on 07/22/2020 02/29/20   Jonetta Osgood, MD  lamoTRIgine (LAMICTAL) 25 MG tablet Take 2 tablets (50 mg total) by mouth 2 (two) times daily. For mood stabilization 11/11/18   Lindell Spar I, NP  lubiprostone (AMITIZA) 24 MCG capsule Take 1 capsule (24 mcg total) by mouth 2 (two) times daily with a meal. Patient taking differently: Take 24 mcg by mouth daily with breakfast. 10/27/18   Esterwood, Amy S, PA-C  metoCLOPramide (REGLAN) 10 MG tablet Take 10 mg by mouth 3 (three) times daily as needed. 08/23/20   [provider]  mirtazapine (  REMERON) 7.5 MG tablet Take 1 tablet (7.5 mg total) by mouth at bedtime. For depression/sleep 11/11/18   Armandina Stammer I, NP  Multiple Vitamin (MULTIVITAMIN WITH MINERALS) TABS tablet Take 1 tablet by mouth daily. 09/18/18   Clapacs, Jackquline Denmark, MD  ondansetron (ZOFRAN ODT) 4 MG disintegrating tablet 4mg  ODT q4 hours prn nausea/vomit 08/21/20   Mesner, Barbara Cower, MD  pantoprazole (PROTONIX) 40 MG tablet Take 1 tablet (40 mg total) by mouth 2 (two) times daily. For acid reflux 01/29/19   Jae Dire, MD  promethazine (PHENERGAN) 12.5 MG tablet Take 1 tablet (12.5 mg total) by mouth every 8 (eight) hours as needed for up to 5 days for nausea or vomiting. 02/08/21 02/13/21  Orvil Feil, PA-C  propranolol (INDERAL) 20 MG tablet Take 1 tablet (20 mg total) by mouth 2 (two) times daily. For anxiety/HTN Patient taking differently: Take 20 mg by mouth daily. For anxiety/HTN 12/23/18   Swayze, Ava, DO  QUEtiapine (SEROQUEL) 100 MG tablet Take 300 mg by mouth at bedtime. 07/05/19   [provider]  QUEtiapine (SEROQUEL) 50 MG tablet Take 50 mg by mouth 2 (two) times  daily. 12/13/20   [provider]  senna-docusate (SENOKOT-S) 8.6-50 MG tablet Take 2 tablets by mouth at bedtime. Patient not taking: Reported on 07/22/2020 02/29/20   Maretta Bees, MD  senna-docusate (SENOKOT-S) 8.6-50 MG tablet TAKE 2 TABLETS BY MOUTH AT BEDTIME. Patient not taking: Reported on 07/22/2020 02/29/20 02/28/21  Maretta Bees, MD  topiramate (TOPAMAX) 25 MG tablet Take 1 tablet (25 mg total) by mouth 2 (two) times daily. For seizure activities 11/11/18   Armandina Stammer I, NP  prochlorperazine (COMPAZINE) 25 MG suppository PLACE 1 SUPPOSITORY (25 MG TOTAL) RECTALLY EVERY TWELVE HOURS AS NEEDED FOR NAUSEA OR VOMITING. 02/29/20 02/29/20  Maretta Bees, MD    Physical Exam: Vitals:   02/12/21 1227 02/12/21 1303 02/12/21 1325 02/12/21 1542  BP:  (!) 134/100  (!) 134/95  Pulse:  (!) 105 (!) 103 (!) 113  Resp:  20 15 17   Temp: 99.6 F (37.6 C)   98.2 F (36.8 C)  TempSrc: Oral   Oral  SpO2:  100% 100%   Weight:      Height:       Physical Exam Vitals and nursing note reviewed.  Constitutional:      Appearance: She is obese.  HENT:     Head: Normocephalic.  Cardiovascular:     Rate and Rhythm: Regular rhythm. Tachycardia present.     Heart sounds: No murmur heard. Pulmonary:     Effort: Pulmonary effort is normal.     Breath sounds: Normal breath sounds.  Abdominal:     General: Bowel sounds are normal. There is no distension.     Palpations: Abdomen is soft.     Tenderness: There is generalized abdominal tenderness.  Skin:    General: Skin is warm and dry.     Comments: Left groin abscess with purulent discharge.  Neurological:     General: No focal deficit present.     Mental Status: She is alert and oriented to person, place, and time.  Psychiatric:        Mood and Affect: Mood normal.     Data Reviewed:  There are no new results to review at this time.  Assessment and Plan: Principal Problem:   Intractable nausea and  vomiting Observation/telemetry. Stated has not been using cannabinoids. Keep n.p.o. for now. Continue IV fluids. Continue  metoclopramide IVP 10 mg q 6 hr. Pantoprazole 40 mg IVPB every 24 hours.  Zofran 4 mg IVP every 6 hours PRN.  Active Problems:   Chronic constipation Optimize electrolytes.    Left inguinal abscess Continue vancomycin per pharmacy. Follow up pending abscess culture result.    Asymptomatic bacteriuria No flank pain, dysuria or hematuria. Will defer further antibiotics at this time.    Tachycardia/Hypertension Has not taken propranolol due to emesis. Metoprolol 5 mg IVP every 8 hours while NPO. Monitor blood pressure and heart rate.    Hypokalemia Replenishing. Follow potassium level.    Diabetic gastroparesis (HCC) Continue metoclopramide 10 mg IV every 6 hours.    Uncontrolled type 1 diabetes mellitus with hyperglycemia,     with long-term current use of insulin (HCC) Currently NPO. Continue basal insulin: Lantus 60 units SQ at bedtime. CBG monitoring every 6 hours. Switch to CBG monitoring with RI SS once cleared for oral intake.    Microcytic anemia Iron studies were normal a year ago. Monitor hematocrit and hemoglobin.    Nicotine dependence She has been using nicotine vaporizer. Begin NicoDerm 14 mg daily.   Advance Care Planning:   Code Status: Full Code   Consults: None.  Family Communication:   Severity of Illness: The appropriate patient status for this patient is OBSERVATION. Observation status is judged to be reasonable and necessary in order to provide the required intensity of service to ensure the patient's safety. The patient's presenting symptoms, physical exam findings, and initial radiographic and laboratory data in the context of their medical condition is felt to place them at decreased risk for further clinical deterioration. Furthermore, it is anticipated that the patient will be medically stable for discharge from the  hospital within 2 midnights of admission.   Author: Reubin Milan, MD 02/12/2021 5:19 PM  For on call review [email or web address].

## 2021-02-12 NOTE — Progress Notes (Signed)
Pharmacy Antibiotic Note  Nancy [name] is a 21 y.o. female admitted on 02/12/2021. Pharmacy has been consulted for vancomycin dosing for cellulitis. Concern for left inguinal abscess.  Plan: Vancomycin 2000 mg given. Follow with vancomycin 750 mg IV q12h Goal AUC 400-550 Expected AUC: 423 SCr used: 0.8   Height: 5\' 3"  (160 cm) Weight: 88.5 kg (195 lb) IBW/kg (Calculated) : 52.4  Temp (24hrs), Avg:98.8 F (37.1 C), Min:98.2 F (36.8 C), Max:99.6 F (37.6 C)  Recent Labs  Lab 02/08/21 1407 02/12/21 1214  WBC 9.4 8.7  CREATININE 0.59 0.58    Estimated Creatinine Clearance: 116.3 mL/min (by C-G formula based on SCr of 0.58 mg/dL).    Allergies  Allergen Reactions   Tomato Anaphylaxis   Ibuprofen Other (See Comments)    GI MD said to not take this anymore   Oatmeal Rash    Antimicrobials this admission: Vancomycin 2/7 >> Ceftriaxone 2/7 x 1   Microbiology results: 2/7 UCx: pending  2/7 Cx (groin): pending  Thank you for allowing pharmacy to be a part of this patients care.  Tawnya Crook, PharmD, BCPS Clinical Pharmacist 02/12/2021 6:21 PM

## 2021-02-12 NOTE — ED Triage Notes (Signed)
Pt to ED via [facility] from home for abdominal pain and "passing out". EMS reports pt having N/V as well. Pt has hx/o DM and has not been table to take her medications due to N/V.

## 2021-02-12 NOTE — ED Notes (Signed)
Patient transported to CT 

## 2021-02-12 NOTE — ED Notes (Signed)
Patient reports green/white/clear discharge from breasts for months. C/o pain on left breast around nipple. Reports inverted right nipple X1 year. MD Terrilee Files made aware

## 2021-02-12 NOTE — ED Provider Notes (Signed)
[facility] Provider Note    Event Date/Time   First MD Initiated Contact with Patient 02/12/21 1206     (approximate)   History   Abdominal Pain   HPI  Nancy [name] is a 21 y.o. female  with a history of cannabinoid hyperemesis syndrome, pseudoseizures, cyclic vomiting disorder, seizures and diabetes presents presents for assessment of approximately 1 week of some acute on chronic abdominal pain associate with increased from baseline nausea and vomiting.  She also states over the last week she has a boil appear in her left groin area and has been draining pus.  She also notes that she has had a little bit of white discharge from her right nipple over the last year but this is not different from usual.  No other new pains or masses around the nipple area.  Patient states she has passed out 12 times in the past week and think she hit her head.  She is denying any neck or back pain, chest pain, cough, fever, shortness of breath, extremity pain, extremity weakness numbness or tingling, urinary symptoms or any other clear associated sick symptoms.  States her last bowel movement was in late December of last year.  She states she is been taking Zofran and Reglan but these not been helping with her nausea and vomiting.  She is on MiraLAX and has been taking magnesium citrate and using over-the-counter enemas as well but this has not helped her have a bowel movement.  She denies EtOH use illicit drug or tobacco abuse.  Denies any vaginal bleeding discharge or any other clear associated sick symptoms.     Past Medical History:  Diagnosis Date   Acanthosis nigricans    Anxiety    CHF (congestive heart failure) (HCC)    Chronic lower back pain    Depression    DKA, type 1 ([place]) 09/13/2018   Dyspepsia    Obesity    Ovarian cyst    pt is not aware of this hx (11/24/2017)   Precocious adrenarche (HCC)    Premature baby    Seizures (HCC)    Type II diabetes mellitus  ([place])    insulin dependant      Physical Exam  Triage Vital Signs: ED Triage Vitals  Enc Vitals Group     BP 02/12/21 1213 (!) 137/93     Pulse Rate 02/12/21 1213 97     Resp 02/12/21 1213 16     Temp --      Temp src --      SpO2 02/12/21 1213 100 %     Weight 02/12/21 1211 195 lb (88.5 kg)     Height 02/12/21 1211 5\' 3"  (1.6 m)     Head Circumference --      Peak Flow --      Pain Score --      Pain Loc --      Pain Edu? --      Excl. in [place]? --     Most recent vital signs: Vitals:   02/12/21 1303 02/12/21 1325  BP: (!) 134/100   Pulse: (!) 105 (!) 103  Resp: 20 15  Temp:    SpO2: 100% 100%    General: Awake, appears mildly uncomfortable. CV:  Good peripheral perfusion.  2+ radial pulses. Resp:  Normal effort.  Clear bilaterally. Abd:  No distention.  Mildly tender throughout. Other:  No tenderness over the C/T/L-spine.  Patient has symmetric strength in bilateral  upper and lower extremities.  No obvious trauma to extremities.  Sensation intact light touch throughout.  Cranial nerves II through XII grossly intact.  On exam of the right breast there appears to be an inverted nipple without any fluctuance or active drainage bleeding or surrounding induration erythema or significant tenderness.  Exam of the left groin there appears to be an open abscess with draining actively purulent fluid.  Some mild surrounding erythema and tenderness.  No significant surrounding changes extending beyond approximately 1 cm oval-shaped margin.   ED Results / Procedures / Treatments  Labs (all labs ordered are listed, but only abnormal results are displayed) Labs Reviewed  COMPREHENSIVE METABOLIC PANEL - Abnormal; Notable for the following components:      Result Value   Potassium 3.2 (*)    Glucose, Bld 220 (*)    AST 13 (*)    All other components within normal limits  CBC - Abnormal; Notable for the following components:   Hemoglobin 11.7 (*)    MCV 78.6 (*)    MCH 25.5  (*)    All other components within normal limits  URINALYSIS, ROUTINE W REFLEX MICROSCOPIC - Abnormal; Notable for the following components:   Color, Urine AMBER (*)    APPearance CLOUDY (*)    Ketones, ur 5 (*)    Protein, ur 30 (*)    Leukocytes,Ua TRACE (*)    Bacteria, UA MANY (*)    All other components within normal limits  AEROBIC/ANAEROBIC CULTURE W GRAM STAIN (SURGICAL/DEEP WOUND)  URINE CULTURE  LIPASE, BLOOD  MAGNESIUM  BETA-HYDROXYBUTYRIC ACID  HCG, QUANTITATIVE, PREGNANCY  POC URINE PREG, ED     EKG  ECG is remarkable sinus tachycardia with a ventricular rate of 105, nonspecific change in lead III without other clear evidence of acute ischemia or significant arrhythmia   RADIOLOGY  CT Noncon head ordered interpreted myself shows no evidence of hemorrhage, skull fracture ischemia or mass effect.  Also reviewed radiology interpretation and agree with the findings.  CT abdomen pelvis shows no evidence of an SBO, appendicitis, diverticulitis, pancreatitis, kidney stone, perinephric stranding or other clear acute abdominal pelvic process.  There is evidence of left groin abscess noted on exam.  Also reviewed radiology interpretation and agree with the findings and he also noted some mild gallbladder thickening and stable small left adrenal adenoma.   PROCEDURES:  Critical Care performed: No  .1-3 Lead EKG Interpretation Performed by: Lucrezia Starch, MD Authorized by: Lucrezia Starch, MD     Interpretation: non-specific     ECG rate assessment: tachycardic     Rhythm: sinus tachycardia     Ectopy: none     Conduction: normal    The patient is on the cardiac monitor to evaluate for evidence of arrhythmia and/or significant heart rate changes.   MEDICATIONS ORDERED IN ED: Medications  potassium chloride 10 mEq in 100 mL IVPB (10 mEq Intravenous New Bag/Given 02/12/21 1513)  vancomycin (VANCOREADY) IVPB 2000 mg/400 mL (has no administration in time range)   cefTRIAXone (ROCEPHIN) 1 g in sodium chloride 0.9 % 100 mL IVPB (has no administration in time range)  0.9 % NaCl with KCl 20 mEq/ L  infusion (has no administration in time range)  lactated ringers bolus 1,000 mL (has no administration in time range)  lactated ringers bolus 1,000 mL (1,000 mLs Intravenous New Bag/Given 02/12/21 1356)  droperidol (INAPSINE) 2.5 MG/ML injection 2.5 mg (2.5 mg Intravenous Given 02/12/21 1353)  iohexol (OMNIPAQUE) 300 MG/ML  solution 100 mL (100 mLs Intravenous Contrast Given 02/12/21 1329)  metoCLOPramide (REGLAN) injection 10 mg (10 mg Intravenous Given 02/12/21 1509)     IMPRESSION / MDM / ASSESSMENT AND PLAN / ED COURSE  I reviewed the triage vital signs and the nursing notes.                              Differential diagnosis includes, but is not limited to abdominal pain related to appendicitis, cholecystitis, constipation, pancreatitis, metabolic derangements, cystitis, kidney stone possible SBO.  Recurrent syncope possibly related to dehydration from GI losses, vasovagal syncope, arrhythmia, anemia, metabolic derangements with a lower suspicion for CVA or PE given absence of any focal neurological deficits any complaint of chest pain or shortness of breath or abnormal vital signs.  Low suspicion for acute cardiomyopathy.  With regard to the right nipple discharge this seems fairly chronic and there is no evidence of an abscess or clear cellulitis on exam.  I think patient can follow this up with her PCP.  CBC shows no leukocytosis and stable anemia with hemoglobin of 11.7 compared to 11.94 days ago.3  ECG is remarkable sinus tachycardia with a ventricular rate of 105, nonspecific change in lead III without other clear evidence of acute ischemia or significant arrhythmia  CT Noncon head ordered interpreted myself shows no evidence of hemorrhage, skull fracture ischemia or mass effect.  Also reviewed radiology interpretation and agree with the findings.  CT  abdomen pelvis shows no evidence of an SBO, appendicitis, diverticulitis, pancreatitis, kidney stone, perinephric stranding or other clear acute abdominal pelvic process.  There is evidence of left groin abscess noted on exam.  Also reviewed radiology interpretation and agree with the findings and he also noted some mild gallbladder thickening and stable small left adrenal adenoma.  This finding on CT matches the findings on exam of 1.  Returning of an abscess of groin.  Offered patient I&D to further enlarge the social declines this preferring to only do antibiotics at this time.  CMP shows a K of 3.2 and a glucose of 220 without any other significant electrolyte or metabolic derangements.  Lipase not consistent with acute pancreatitis.  CBC without leukocytosis or acute anemia.  UA is concerning for possible cystitis despite absence of any burning with many bacteria and 11-20 WBCs although is possible its contamination as there is some squamous epithelial cells noted as well.  Pregnancy test is negative.  After some IV fluids droperidol patient still states she feels too nauseous to attempt a or drink anything.  We will give a dose of Reglan and admit to medicine service for intractable nausea and vomiting and fairly significant constipation which may require whole bowel cleanout as patient appears required this with an NG tube.  I discussed this with the reading provider.  I do not believe patient is septic but will give a dose of IV antibiotics for her left groin abscess and UTI.      FINAL CLINICAL IMPRESSION(S) / ED DIAGNOSES   Final diagnoses:  Syncope, unspecified syncope type  Injury of head, initial encounter  Generalized abdominal pain  Constipation, unspecified constipation type  Hyperglycemia  Abscess  Acute cystitis without hematuria  Nipple discharge  Hypokalemia     Rx / DC Orders   ED Discharge Orders     None        Note:  This document was prepared using Dragon  voice recognition software  and may include unintentional dictation errors.   Lucrezia Starch, MD 02/12/21 [phone]

## 2021-02-12 NOTE — Plan of Care (Signed)

## 2021-02-13 DIAGNOSIS — R112 Nausea with vomiting, unspecified: Secondary | ICD-10-CM | POA: Diagnosis not present

## 2021-02-13 LAB — COMPREHENSIVE METABOLIC PANEL
ALT: 10 U/L (ref 0–44)
AST: 10 U/L — ABNORMAL LOW (ref 15–41)
Albumin: 3.4 g/dL — ABNORMAL LOW (ref 3.5–5.0)
Alkaline Phosphatase: 65 U/L (ref 38–126)
Anion gap: 5 (ref 5–15)
BUN: 7 mg/dL (ref 6–20)
CO2: 24 mmol/L (ref 22–32)
Calcium: 8.5 mg/dL — ABNORMAL LOW (ref 8.9–10.3)
Chloride: 105 mmol/L (ref 98–111)
Creatinine, Ser: 0.57 mg/dL (ref 0.44–1.00)
GFR, Estimated: >60 mL/min (ref >60)
Glucose, Bld: 111 mg/dL — ABNORMAL HIGH (ref 70–99)
Potassium: 3.6 mmol/L (ref 3.5–5.1)
Sodium: 134 mmol/L — ABNORMAL LOW (ref 135–145)
Total Bilirubin: 0.9 mg/dL (ref 0.3–1.2)
Total Protein: 6.3 g/dL — ABNORMAL LOW (ref 6.5–8.1)

## 2021-02-13 LAB — GLUCOSE, CAPILLARY
Glucose-Capillary: 108 mg/dL — ABNORMAL HIGH (ref 70–99)
Glucose-Capillary: 104 mg/dL — ABNORMAL HIGH (ref 70–99)
Glucose-Capillary: 259 mg/dL — ABNORMAL HIGH (ref 70–99)

## 2021-02-13 LAB — CBC
HCT: 33.7 % — ABNORMAL LOW (ref 36.0–46.0)
Hemoglobin: 10.7 g/dL — ABNORMAL LOW (ref 12.0–15.0)
MCH: 24.7 pg — ABNORMAL LOW (ref 26.0–34.0)
MCHC: 31.8 g/dL (ref 30.0–36.0)
MCV: 77.6 fL — ABNORMAL LOW (ref 80.0–100.0)
Platelets: 350 10*3/uL (ref 150–400)
RBC: 4.34 MIL/uL (ref 3.87–5.11)
RDW: 15.4 % (ref 11.5–15.5)
WBC: 10 10*3/uL (ref 4.0–10.5)
nRBC: 0 % (ref 0.0–0.2)

## 2021-02-13 LAB — HEMOGLOBIN A1C
Hgb A1c MFr Bld: 6.7 % — ABNORMAL HIGH (ref 4.8–5.6)
Mean Plasma Glucose: 145.59 mg/dL

## 2021-02-13 LAB — HIV ANTIBODY (ROUTINE TESTING W REFLEX): HIV Screen 4th Generation wRfx: NONREACTIVE

## 2021-02-13 MED ORDER — INSULIN ASPART 100 UNIT/ML IJ SOLN: 0.0000 [IU] | Freq: Every day | INTRAMUSCULAR | Status: DC

## 2021-02-13 MED ORDER — TOPIRAMATE 25 MG PO TABS
25.0000 mg | ORAL_TABLET | Freq: Two times a day (BID) | ORAL | Status: DC
  Administered 2021-02-13: 25 mg via ORAL
  Filled 2021-02-13 (×2): qty 1

## 2021-02-13 MED ORDER — DOXYCYCLINE MONOHYDRATE 100 MG PO CAPS: 100.0000 mg | ORAL_CAPSULE | Freq: Two times a day (BID) | ORAL | 0 refills | Status: AC

## 2021-02-13 MED ORDER — MIRTAZAPINE 15 MG PO TABS: 7.5000 mg | ORAL_TABLET | Freq: Every day | ORAL | Status: DC

## 2021-02-13 MED ORDER — LUBIPROSTONE 24 MCG PO CAPS
24.0000 ug | ORAL_CAPSULE | Freq: Two times a day (BID) | ORAL | Status: DC
  Filled 2021-02-13: qty 1

## 2021-02-13 MED ORDER — METOCLOPRAMIDE HCL 10 MG PO TABS: 10.0000 mg | ORAL_TABLET | Freq: Three times a day (TID) | ORAL | 0 refills | Status: DC

## 2021-02-13 MED ORDER — INSULIN ASPART 100 UNIT/ML IJ SOLN
0.0000 [IU] | Freq: Three times a day (TID) | INTRAMUSCULAR | Status: DC
  Administered 2021-02-13: 8 [IU] via SUBCUTANEOUS
  Filled 2021-02-13: qty 1

## 2021-02-13 MED ORDER — INSULIN ASPART 100 UNIT/ML IJ SOLN: 0.0000 [IU] | Freq: Three times a day (TID) | INTRAMUSCULAR | Status: DC

## 2021-02-13 MED ORDER — ARIPIPRAZOLE 10 MG PO TABS
10.0000 mg | ORAL_TABLET | Freq: Two times a day (BID) | ORAL | Status: DC
  Administered 2021-02-13: 10 mg via ORAL
  Filled 2021-02-13 (×2): qty 1

## 2021-02-13 MED ORDER — QUETIAPINE FUMARATE 25 MG PO TABS: 50.0000 mg | ORAL_TABLET | Freq: Two times a day (BID) | ORAL | Status: DC

## 2021-02-13 MED ORDER — QUETIAPINE FUMARATE 300 MG PO TABS
300.0000 mg | ORAL_TABLET | Freq: Every day | ORAL | Status: DC
  Filled 2021-02-13: qty 1

## 2021-02-13 MED ORDER — INSULIN GLARGINE-YFGN 100 UNIT/ML ~~LOC~~ SOLN
20.0000 [IU] | Freq: Every day | SUBCUTANEOUS | Status: DC
  Filled 2021-02-13: qty 0.2

## 2021-02-13 MED ORDER — LAMOTRIGINE 25 MG PO TABS
50.0000 mg | ORAL_TABLET | Freq: Two times a day (BID) | ORAL | Status: DC
  Administered 2021-02-13: 50 mg via ORAL
  Filled 2021-02-13: qty 2

## 2021-02-13 NOTE — Discharge Summary (Signed)
Physician Discharge Summary  Nancy [name] [id number] DOB: [birth year] DOA: 02/12/2021  PCP: Nolene Ebbs, MD  Admit date: 02/12/2021 Discharge date: 02/13/2021  Admitted From: Home Disposition:  Home  Recommendations for Outpatient Follow-up:  Follow up with PCP in 1-2 weeks Consider follow-up with GI versus endocrinology  Home Health: No Equipment/Devices: None  Discharge Condition: Stable CODE STATUS: Full Diet recommendation: Carb modified  Brief/Interim Summary: 21 y.o. female with medical history significant of acanthosis nigricans, anxiety, depression, unspecified CHF, type I DM, history of type I DKA, dyspepsia, obesity, ovarian cyst, precocious adrenarche, history of premature delivery, history of pseudoseizures who is coming to the emergency department due to acute on chronic nausea with over 20 episodes of emesis in the past 2 days.  No diarrhea, melena or hematochezia.  She has not had a bowel movement in weeks.  No flank pain, dysuria, frequency or hematuria.  No fever, chills, sore throat, rhinorrhea, wheezing, productive cough, dyspnea or hemoptysis.  No chest pain, palpitations, diaphoresis, PND, orthopnea or pitting edema of the lower extremities.  No polyuria, polydipsia, polyphagia or blurry vision.  Admitted to observation status.  Placed on scheduled IV Reglan.  Good result.  Patient tolerating soft diet on day of discharge.  Can discharge home.  Reglan prescription provided at time of DC.  1 month supply.  Outpatient follow-up with PCP versus endocrinology    Discharge Diagnoses:  Principal Problem:   Intractable nausea and vomiting Active Problems:   Hypertension   Tachycardia   Hypokalemia   Diabetic gastroparesis (HCC)   Chronic constipation   Microcytic anemia   Uncontrolled type 1 diabetes mellitus with hyperglycemia, with long-term current use of insulin (HCC)   Left inguinal abscess   Asymptomatic bacteriuria   Nicotine dependence   Intractable  nausea and vomiting Suspect element of diabetic gastroparesis though patient states she has had multiple gastric emptying studies and has been seen by GI multiple times.  Her symptoms did improve on scheduled IV Reglan.  Tolerating p.o. diet at time of discharge. Plan: Discharge home.  1 month supply of Reglan 10 mg p.o. 3 times daily provided.  Follow-up outpatient PCP and endocrinology.    Discharge Instructions  Discharge Instructions     Diet - low sodium heart healthy   Complete by: As directed    Increase activity slowly   Complete by: As directed       Allergies as of 02/13/2021       Reactions   Tomato Anaphylaxis   Ibuprofen Other (See Comments)   GI MD said to not take this anymore   Oatmeal Rash        Medication List     STOP taking these medications    cephALEXin 500 MG capsule Commonly known as: KEFLEX   penicillin v potassium 500 MG tablet Commonly known as: VEETID   phenazopyridine 200 MG tablet Commonly known as: Pyridium       TAKE these medications    ARIPiprazole 10 MG tablet Commonly known as: ABILIFY Take 10 mg by mouth in the morning and at bedtime.   atorvastatin 20 MG tablet Commonly known as: LIPITOR Take 1 tablet (20 mg total) by mouth daily at 6 PM. What changed: when to take this   capsaicin 0.025 % cream Commonly known as: ZOSTRIX Apply topically 2 (two) times daily.   diclofenac Sodium 1 % Gel Commonly known as: VOLTAREN Apply 1 application topically 2 (two) times daily as needed (chest pains).   doxycycline 100  MG capsule Commonly known as: MONODOX Take 1 capsule (100 mg total) by mouth 2 (two) times daily for 5 days.   insulin glargine 100 unit/mL Sopn Commonly known as: LANTUS Inject 30 Units into the skin at bedtime.   lamoTRIgine 25 MG tablet Commonly known as: LAMICTAL Take 2 tablets (50 mg total) by mouth 2 (two) times daily. For mood stabilization   lubiprostone 24 MCG capsule Commonly known as:  AMITIZA Take 1 capsule (24 mcg total) by mouth 2 (two) times daily with a meal. What changed: when to take this   metoCLOPramide 10 MG tablet Commonly known as: REGLAN Take 1 tablet (10 mg total) by mouth 3 (three) times daily before meals. What changed:  when to take this reasons to take this   mirtazapine 7.5 MG tablet Commonly known as: REMERON Take 1 tablet (7.5 mg total) by mouth at bedtime. For depression/sleep   multivitamin with minerals Tabs tablet Take 1 tablet by mouth daily.   NovoLOG FlexPen 100 UNIT/ML FlexPen Generic drug: insulin aspart Inject 15 Units into the skin 2 (two) times daily before a meal.   ondansetron 4 MG disintegrating tablet Commonly known as: Zofran ODT 4mg  ODT q4 hours prn nausea/vomit   propranolol 20 MG tablet Commonly known as: INDERAL Take 1 tablet (20 mg total) by mouth 2 (two) times daily. For anxiety/HTN What changed: when to take this   QUEtiapine 100 MG tablet Commonly known as: SEROQUEL Take 300 mg by mouth at bedtime.   QUEtiapine 50 MG tablet Commonly known as: SEROQUEL Take 50 mg by mouth 2 (two) times daily.   senna-docusate 8.6-50 MG tablet Commonly known as: Senokot-S Take 2 tablets by mouth at bedtime.   topiramate 25 MG tablet Commonly known as: TOPAMAX Take 1 tablet (25 mg total) by mouth 2 (two) times daily. For seizure activities        Allergies  Allergen Reactions   Tomato Anaphylaxis   Ibuprofen Other (See Comments)    GI MD said to not take this anymore   Oatmeal Rash    Consultations: None   Procedures/Studies: DG Chest 2 View  Result Date: 02/08/2021 CLINICAL DATA:  Chest pain EXAM: CHEST - 2 VIEW COMPARISON:  Chest x-ray dated October 31, 2020 FINDINGS: The heart size and mediastinal contours are within normal limits. Both lungs are clear. The visualized skeletal structures are unremarkable. IMPRESSION: No active cardiopulmonary disease. Electronically Signed   By: Yetta Glassman M.D.    On: 02/08/2021 16:39   CT HEAD WO CONTRAST (5MM)  Result Date: 02/12/2021 CLINICAL DATA:  Head injury. EXAM: CT HEAD WITHOUT CONTRAST TECHNIQUE: Contiguous axial images were obtained from the base of the skull through the vertex without intravenous contrast. RADIATION DOSE REDUCTION: This exam was performed according to the departmental dose-optimization program which includes automated exposure control, adjustment of the mA and/or kV according to patient size and/or use of iterative reconstruction technique. COMPARISON:  April 10, 2020. FINDINGS: Brain: No evidence of acute infarction, hemorrhage, hydrocephalus, extra-axial collection or mass lesion/mass effect. Vascular: No hyperdense vessel or unexpected calcification. Skull: Normal. Negative for fracture or focal lesion. Sinuses/Orbits: No acute finding. Other: None. IMPRESSION: No acute intracranial abnormality seen. Electronically Signed   By: Marijo Conception M.D.   On: 02/12/2021 13:46   CT ABDOMEN PELVIS W CONTRAST  Result Date: 02/12/2021 CLINICAL DATA:  Abdominal pain, acute, nonlocalized. Syncopal episode with nausea and vomiting. History of diabetes. EXAM: CT ABDOMEN AND PELVIS WITH CONTRAST TECHNIQUE: Multidetector CT  imaging of the abdomen and pelvis was performed using the standard protocol following bolus administration of intravenous contrast. RADIATION DOSE REDUCTION: This exam was performed according to the departmental dose-optimization program which includes automated exposure control, adjustment of the mA and/or kV according to patient size and/or use of iterative reconstruction technique. CONTRAST:  147mL OMNIPAQUE IOHEXOL 300 MG/ML  SOLN COMPARISON:  Abdominopelvic CT 08/21/2020 FINDINGS: Lower chest: Clear lung bases. No significant pleural or pericardial effusion. There is a small hiatal hernia. Hepatobiliary: The liver is normal in density without suspicious focal abnormality. No evidence of gallstones, gallbladder wall thickening or  biliary dilatation. Pancreas: Unremarkable. No pancreatic ductal dilatation or surrounding inflammatory changes. Spleen: Normal in size without focal abnormality. Adrenals/Urinary Tract: Stable 14 mm left adrenal nodule on image 21/2, consistent with an incidental adenoma based on stability. The right adrenal gland appears normal. Both kidneys appear normal, without evidence of urinary tract calculus, hydronephrosis or focal abnormality. The bladder is incompletely distended and suboptimally evaluated. There is possible bladder wall thickening. Stomach/Bowel: No enteric contrast was administered for this examination. There is high density particulate matter within the lumen of the right colon. The stomach appears unremarkable for its degree of distension. No evidence of bowel wall thickening, distention or surrounding inflammatory change. The appendix appears normal. Vascular/Lymphatic: There are no enlarged lymph nodes within the abdomen or pelvis. There is a heterogeneous mass in the left inguinal region with central low-density, measuring 2.9 x 2.6 cm transverse on image 82/2 and extending 3.9 cm in length on sagittal image 82/6. This extends to the skin surface and could reflect a necrotic lymph node or abscess. No air or foreign body is seen associated with this lesion. There are other mildly prominent left inguinal lymph nodes, measuring up to 1.3 cm short axis on coronal image 27/5. No significant vascular findings are evident. Reproductive: The uterus and ovaries appear unremarkable. No adnexal mass. Right-sided Bartholin's cyst measuring approximately 2.2 cm on image 88/2 is unchanged. Other: No ascites, free air or focal intra-fluid collection. Musculoskeletal: No acute or significant osseous findings. IMPRESSION: 1. Heterogeneous mass superficially in the left groin, suspicious for necrotic adenopathy or an abscess. Correlate clinically and consider tissue sampling as warranted. 2. No intra-abdominal  adenopathy or acute inflammatory changes identified. 3. Mild bladder wall thickening, likely due to incomplete distension. 4. Stable small left adrenal adenoma. Electronically Signed   By: Richardean Sale M.D.   On: 02/12/2021 13:55      Subjective: Seen and examined at time of discharge.  Stable no distress.  Tolerating p.o.  No abdominal pain  Discharge Exam: Vitals:   02/13/21 1244 02/13/21 1246  BP: (!) 133/106 (!) 131/91  Pulse: (!) 103 (!) 103  Resp:  18  Temp:    SpO2: 100% 100%   Vitals:   02/13/21 0402 02/13/21 0845 02/13/21 1244 02/13/21 1246  BP: 120/70 133/84 (!) 133/106 (!) 131/91  Pulse: 95 88 (!) 103 (!) 103  Resp:  18  18  Temp: 98.9 F (37.2 C) 98.3 F (36.8 C)    TempSrc: Oral Oral    SpO2: 100% 100% 100% 100%  Weight:      Height:        General: Pt is alert, awake, not in acute distress Cardiovascular: RRR, S1/S2 +, no rubs, no gallops Respiratory: CTA bilaterally, no wheezing, no rhonchi Abdominal: Soft, NT, ND, bowel sounds + Extremities: no edema, no cyanosis    The results of significant diagnostics from this hospitalization (including  imaging, microbiology, ancillary and laboratory) are listed below for reference.     Microbiology: Recent Results (from the past 240 hour(s))  Aerobic/Anaerobic Culture w Gram Stain (surgical/deep wound)     Status: None (Preliminary result)   Collection Time: 02/12/21  1:12 PM   Specimen: Groin  Result Value Ref Range Status   Specimen Description   Final    GROIN LEFT Performed at [facility], [address] [place], [place] [address]    Special Requests   Final    NONE Performed at [facility], [address] [place], [place] [address]    Gram Stain   Final    RARE WBC PRESENT,BOTH PMN AND MONONUCLEAR RARE GRAM POSITIVE COCCI IN PAIRS RARE GRAM POSITIVE RODS    Culture   Final    CULTURE REINCUBATED FOR BETTER GROWTH Performed at [facility], [place] [address]  [place], [place] [address]    Report Status PENDING  Incomplete  Resp Panel by RT-PCR (Flu A&B, Covid) Nasopharyngeal Swab     Status: None   Collection Time: 02/12/21  3:50 PM   Specimen: Nasopharyngeal Swab; Nasopharyngeal(NP) swabs in vial transport medium  Result Value Ref Range Status   SARS Coronavirus 2 by RT PCR NEGATIVE NEGATIVE Final    Comment: (NOTE) SARS-CoV-2 target nucleic acids are NOT DETECTED.  The SARS-CoV-2 RNA is generally detectable in upper respiratory specimens during the acute phase of infection. The lowest concentration of SARS-CoV-2 viral copies this assay can detect is 138 copies/mL. A negative result does not preclude SARS-Cov-2 infection and should not be used as the sole basis for treatment or other patient management decisions. A negative result may occur with  improper specimen collection/handling, submission of specimen other than nasopharyngeal swab, presence of viral mutation(s) within the areas targeted by this assay, and inadequate number of viral copies(<138 copies/mL). A negative result must be combined with clinical observations, patient history, and epidemiological information. The expected result is Negative.  Fact Sheet for Patients:  EntrepreneurPulse.com.au  Fact Sheet for Healthcare Providers:  IncredibleEmployment.be  This test is no t yet approved or cleared by the [place] FDA and  has been authorized for detection and/or diagnosis of SARS-CoV-2 by FDA under an Emergency Use Authorization (EUA). This EUA will remain  in effect (meaning this test can be used) for the duration of the COVID-19 declaration under Section 564(b)(1) of the Act, 21 U.S.C.section 360bbb-3(b)(1), unless the authorization is terminated  or revoked sooner.       Influenza A by PCR NEGATIVE NEGATIVE Final   Influenza B by PCR NEGATIVE NEGATIVE Final    Comment: (NOTE) The Xpert Xpress SARS-CoV-2/FLU/RSV plus assay is  intended as an aid in the diagnosis of influenza from Nasopharyngeal swab specimens and should not be used as a sole basis for treatment. Nasal washings and aspirates are unacceptable for Xpert Xpress SARS-CoV-2/FLU/RSV testing.  Fact Sheet for Patients: EntrepreneurPulse.com.au  Fact Sheet for Healthcare Providers: IncredibleEmployment.be  This test is not yet approved or cleared by the [place] FDA and has been authorized for detection and/or diagnosis of SARS-CoV-2 by FDA under an Emergency Use Authorization (EUA). This EUA will remain in effect (meaning this test can be used) for the duration of the COVID-19 declaration under Section 564(b)(1) of the Act, 21 U.S.C. section 360bbb-3(b)(1), unless the authorization is terminated or revoked.  Performed at [facility], [address] [place], [place] [address]      Labs: BNP (last  3 results) No results for input(s): BNP in the last 8760 hours. Basic Metabolic Panel: Recent Labs  Lab 02/08/21 1407 02/12/21 1213 02/12/21 1214 02/13/21 0328  NA 135  --  135 134*  K 3.5  --  3.2* 3.6  CL 99  --  99 105  CO2 28  --  28 24  GLUCOSE 216*  --  220* 111*  BUN 9  --  9 7  CREATININE 0.59  --  0.58 0.57  CALCIUM 9.0  --  9.1 8.5*  MG  --  2.1  --   --    Liver Function Tests: Recent Labs  Lab 02/08/21 1407 02/12/21 1214 02/13/21 0328  AST 17 13* 10*  ALT 12 13 10   ALKPHOS 81 70 65  BILITOT 0.9 0.8 0.9  PROT 7.8 7.4 6.3*  ALBUMIN 4.0 3.9 3.4*   Recent Labs  Lab 02/08/21 1407 02/12/21 1214  LIPASE 26 29   No results for input(s): AMMONIA in the last 168 hours. CBC: Recent Labs  Lab 02/08/21 1407 02/12/21 1214 02/13/21 0328  WBC 9.4 8.7 10.0  HGB 11.9* 11.7* 10.7*  HCT 37.4 36.0 33.7*  MCV 79.2* 78.6* 77.6*  PLT 338 350 350   Cardiac Enzymes: No results for input(s): CKTOTAL, CKMB, CKMBINDEX, TROPONINI in the last 168 hours. BNP: Invalid input(s):  POCBNP CBG: Recent Labs  Lab 02/12/21 1842 02/13/21 0138 02/13/21 0646 02/13/21 1130  GLUCAP 131* 108* 104* 259*   D-Dimer No results for input(s): DDIMER in the last 72 hours. Hgb A1c Recent Labs    02/13/21 0328  HGBA1C 6.7*   Lipid Profile No results for input(s): CHOL, HDL, LDLCALC, TRIG, CHOLHDL, LDLDIRECT in the last 72 hours. Thyroid function studies No results for input(s): TSH, T4TOTAL, T3FREE, THYROIDAB in the last 72 hours.  Invalid input(s): FREET3 Anemia work up No results for input(s): VITAMINB12, FOLATE, FERRITIN, TIBC, IRON, RETICCTPCT in the last 72 hours. Urinalysis    Component Value Date/Time   COLORURINE AMBER (A) 02/12/2021 1300   APPEARANCEUR CLOUDY (A) 02/12/2021 1300   LABSPEC 1.021 02/12/2021 1300   PHURINE 6.0 02/12/2021 1300   GLUCOSEU NEGATIVE 02/12/2021 1300   HGBUR NEGATIVE 02/12/2021 1300   BILIRUBINUR NEGATIVE 02/12/2021 1300   BILIRUBINUR negative 07/13/2018 1651   KETONESUR 5 (A) 02/12/2021 1300   PROTEINUR 30 (A) 02/12/2021 1300   UROBILINOGEN 0.2 07/13/2018 1651   UROBILINOGEN 1.0 07/09/2017 1324   NITRITE NEGATIVE 02/12/2021 1300   LEUKOCYTESUR TRACE (A) 02/12/2021 1300   Sepsis Labs Invalid input(s): PROCALCITONIN,  WBC,  LACTICIDVEN Microbiology Recent Results (from the past 240 hour(s))  Aerobic/Anaerobic Culture w Gram Stain (surgical/deep wound)     Status: None (Preliminary result)   Collection Time: 02/12/21  1:12 PM   Specimen: Groin  Result Value Ref Range Status   Specimen Description   Final    GROIN LEFT Performed at [facility], [address] [place], [place] [address]    Special Requests   Final    NONE Performed at [facility], [address] [place], [place] [address]    Gram Stain   Final    RARE WBC PRESENT,BOTH PMN AND MONONUCLEAR RARE GRAM POSITIVE COCCI IN PAIRS RARE GRAM POSITIVE RODS    Culture   Final    CULTURE REINCUBATED FOR BETTER GROWTH Performed at [facility], [place] [address] [place],  [address]    Report Status PENDING  Incomplete  Resp Panel by RT-PCR (Flu  A&B, Covid) Nasopharyngeal Swab     Status: None   Collection Time: 02/12/21  3:50 PM   Specimen: Nasopharyngeal Swab; Nasopharyngeal(NP) swabs in vial transport medium  Result Value Ref Range Status   SARS Coronavirus 2 by RT PCR NEGATIVE NEGATIVE Final    Comment: (NOTE) SARS-CoV-2 target nucleic acids are NOT DETECTED.  The SARS-CoV-2 RNA is generally detectable in upper respiratory specimens during the acute phase of infection. The lowest concentration of SARS-CoV-2 viral copies this assay can detect is 138 copies/mL. A negative result does not preclude SARS-Cov-2 infection and should not be used as the sole basis for treatment or other patient management decisions. A negative result may occur with  improper specimen collection/handling, submission of specimen other than nasopharyngeal swab, presence of viral mutation(s) within the areas targeted by this assay, and inadequate number of viral copies(<138 copies/mL). A negative result must be combined with clinical observations, patient history, and epidemiological information. The expected result is Negative.  Fact Sheet for Patients:  EntrepreneurPulse.com.au  Fact Sheet for Healthcare Providers:  IncredibleEmployment.be  This test is no t yet approved or cleared by the [place] FDA and  has been authorized for detection and/or diagnosis of SARS-CoV-2 by FDA under an Emergency Use Authorization (EUA). This EUA will remain  in effect (meaning this test can be used) for the duration of the COVID-19 declaration under Section 564(b)(1) of the Act, 21 U.S.C.section 360bbb-3(b)(1), unless the authorization is terminated  or revoked sooner.       Influenza A by PCR NEGATIVE NEGATIVE Final   Influenza B by PCR NEGATIVE NEGATIVE Final    Comment: (NOTE) The Xpert Xpress  SARS-CoV-2/FLU/RSV plus assay is intended as an aid in the diagnosis of influenza from Nasopharyngeal swab specimens and should not be used as a sole basis for treatment. Nasal washings and aspirates are unacceptable for Xpert Xpress SARS-CoV-2/FLU/RSV testing.  Fact Sheet for Patients: EntrepreneurPulse.com.au  Fact Sheet for Healthcare Providers: IncredibleEmployment.be  This test is not yet approved or cleared by the [place] FDA and has been authorized for detection and/or diagnosis of SARS-CoV-2 by FDA under an Emergency Use Authorization (EUA). This EUA will remain in effect (meaning this test can be used) for the duration of the COVID-19 declaration under Section 564(b)(1) of the Act, 21 U.S.C. section 360bbb-3(b)(1), unless the authorization is terminated or revoked.  Performed at [facility], [address] [place], [place] [address]      Time coordinating discharge: Over 30 minutes  SIGNED:   Sidney Ace, MD  [facility] 02/13/2021, 4:20 PM Pager   If 7PM-7AM, please contact night-coverage

## 2021-02-13 NOTE — TOC CM/SW Note (Signed)
Patient has orders to discharge home today. Chart reviewed. PCP is Fleet Contras, MD. On room air. No discharge wound care needs. No TOC needs identified. CSW signing off.  Charlynn Court, CSW [phone]

## 2021-02-13 NOTE — Progress Notes (Addendum)
Inpatient Diabetes Program Recommendations  AACE/ADA: New Consensus Statement on Inpatient Glycemic Control  Target Ranges:  Prepandial:   less than 140 mg/dL      Peak postprandial:   less than 180 mg/dL (1-2 hours)      Critically ill patients:  140 - 180 mg/dL    Latest Reference Range & Units 02/12/21 18:42 02/13/21 01:38 02/13/21 06:46 02/13/21 11:30  Glucose-Capillary 70 - 99 mg/dL 939 (H) 030 (H) 092 (H) 259 (H)   Review of Glycemic Control  Diabetes history: DM Outpatient Diabetes medications: Lantus 30 units QHS, Novolog 15 units with meals Current orders for Inpatient glycemic control: Semglee 60 units QHS  Inpatient Diabetes Program Recommendations:    Insulin: Per MAR, Semglee was not given last night (charted as HOLD).  Please decrease Semglee to 20 units QHS, consider ordering Novolog 0-15 units TID with meals, and Novolog 0-5 units QHS.  NOTE: Noted patient has Semglee 60 units QHS ordered for inpatient glycemic control. Spoke with patient over the phone regarding DM outpatient medications. Patient states that she takes Lantus 30 units QHS and Novolog 15 units with meals for DM control. Patient reports that she consistently takes Lantus 30 units QHS and she will sometimes skip or decrease Novolog dose if glucose is running low or if she is not eating. Patient states that she had full liquid diet for breakfast and she now has soft diet ordered for lunch. Patient reports that if she tolerates lunch well then she may be able to be discharged home today. Patient reports that she last took her Lantus 30 units on 02/11/21 at bedtime. Discussed no Lantus/Semglee given yesterday so glucose may be more elevated today. Patient states she would prefer to keep basal insulin on HS regimen. Discussed recommendation of Semglee 20 units QHS and Novolog 0-15 units TID with meals and Novolog 0-5 units QHS. Informed patient I would communicate with attending provider to discuss recommendations.  Patient agreeable and verbalized understanding and has no questions at this time.  Thanks, Orlando Penner, RN, MSN, CDE Diabetes Coordinator Inpatient Diabetes Program [phone] (Team Pager from 8am to 5pm)

## 2021-02-13 NOTE — Progress Notes (Signed)
Nancy [name] to be D/C'd Home per MD order.  Discussed prescriptions and follow up appointments with the patient. Prescriptions given to patient, medication list explained in detail. Pt verbalized understanding.  Allergies as of 02/13/2021       Reactions   Tomato Anaphylaxis   Ibuprofen Other (See Comments)   GI MD said to not take this anymore   Oatmeal Rash        Medication List     STOP taking these medications    cephALEXin 500 MG capsule Commonly known as: KEFLEX   penicillin v potassium 500 MG tablet Commonly known as: VEETID   phenazopyridine 200 MG tablet Commonly known as: Pyridium       TAKE these medications    ARIPiprazole 10 MG tablet Commonly known as: ABILIFY Take 10 mg by mouth in the morning and at bedtime.   atorvastatin 20 MG tablet Commonly known as: LIPITOR Take 1 tablet (20 mg total) by mouth daily at 6 PM. What changed: when to take this   capsaicin 0.025 % cream Commonly known as: ZOSTRIX Apply topically 2 (two) times daily.   diclofenac Sodium 1 % Gel Commonly known as: VOLTAREN Apply 1 application topically 2 (two) times daily as needed (chest pains).   doxycycline 100 MG capsule Commonly known as: MONODOX Take 1 capsule (100 mg total) by mouth 2 (two) times daily for 5 days.   insulin glargine 100 unit/mL Sopn Commonly known as: LANTUS Inject 30 Units into the skin at bedtime.   lamoTRIgine 25 MG tablet Commonly known as: LAMICTAL Take 2 tablets (50 mg total) by mouth 2 (two) times daily. For mood stabilization   lubiprostone 24 MCG capsule Commonly known as: AMITIZA Take 1 capsule (24 mcg total) by mouth 2 (two) times daily with a meal. What changed: when to take this   metoCLOPramide 10 MG tablet Commonly known as: REGLAN Take 1 tablet (10 mg total) by mouth 3 (three) times daily before meals. What changed:  when to take this reasons to take this   mirtazapine 7.5 MG tablet Commonly known as: REMERON Take 1  tablet (7.5 mg total) by mouth at bedtime. For depression/sleep   multivitamin with minerals Tabs tablet Take 1 tablet by mouth daily.   NovoLOG FlexPen 100 UNIT/ML FlexPen Generic drug: insulin aspart Inject 15 Units into the skin 2 (two) times daily before a meal.   ondansetron 4 MG disintegrating tablet Commonly known as: Zofran ODT 4mg  ODT q4 hours prn nausea/vomit   propranolol 20 MG tablet Commonly known as: INDERAL Take 1 tablet (20 mg total) by mouth 2 (two) times daily. For anxiety/HTN What changed: when to take this   QUEtiapine 100 MG tablet Commonly known as: SEROQUEL Take 300 mg by mouth at bedtime.   QUEtiapine 50 MG tablet Commonly known as: SEROQUEL Take 50 mg by mouth 2 (two) times daily.   senna-docusate 8.6-50 MG tablet Commonly known as: Senokot-S Take 2 tablets by mouth at bedtime.   topiramate 25 MG tablet Commonly known as: TOPAMAX Take 1 tablet (25 mg total) by mouth 2 (two) times daily. For seizure activities        Vitals:   02/13/21 1244 02/13/21 1246  BP: (!) 133/106 (!) 131/91  Pulse: (!) 103 (!) 103  Resp:  18  Temp:    SpO2: 100% 100%    Skin clean, dry and intact without evidence of skin break down, no evidence of skin tears noted. IV catheter discontinued intact. Site  without signs and symptoms of complications. Dressing and pressure applied. Pt denies pain at this time. No complaints noted.  An After Visit Summary was printed and given to the patient. Patient escorted via WC, and D/C home via private auto.  Rigoberto Noel

## 2021-02-14 IMAGING — DX DG CHEST 1V
1 series · 1 of 1 positions shown · non-contrast
Comparison: 01/01/2020

CLINICAL DATA: Chest pain

EXAM:
CHEST  1 VIEW

[chest]
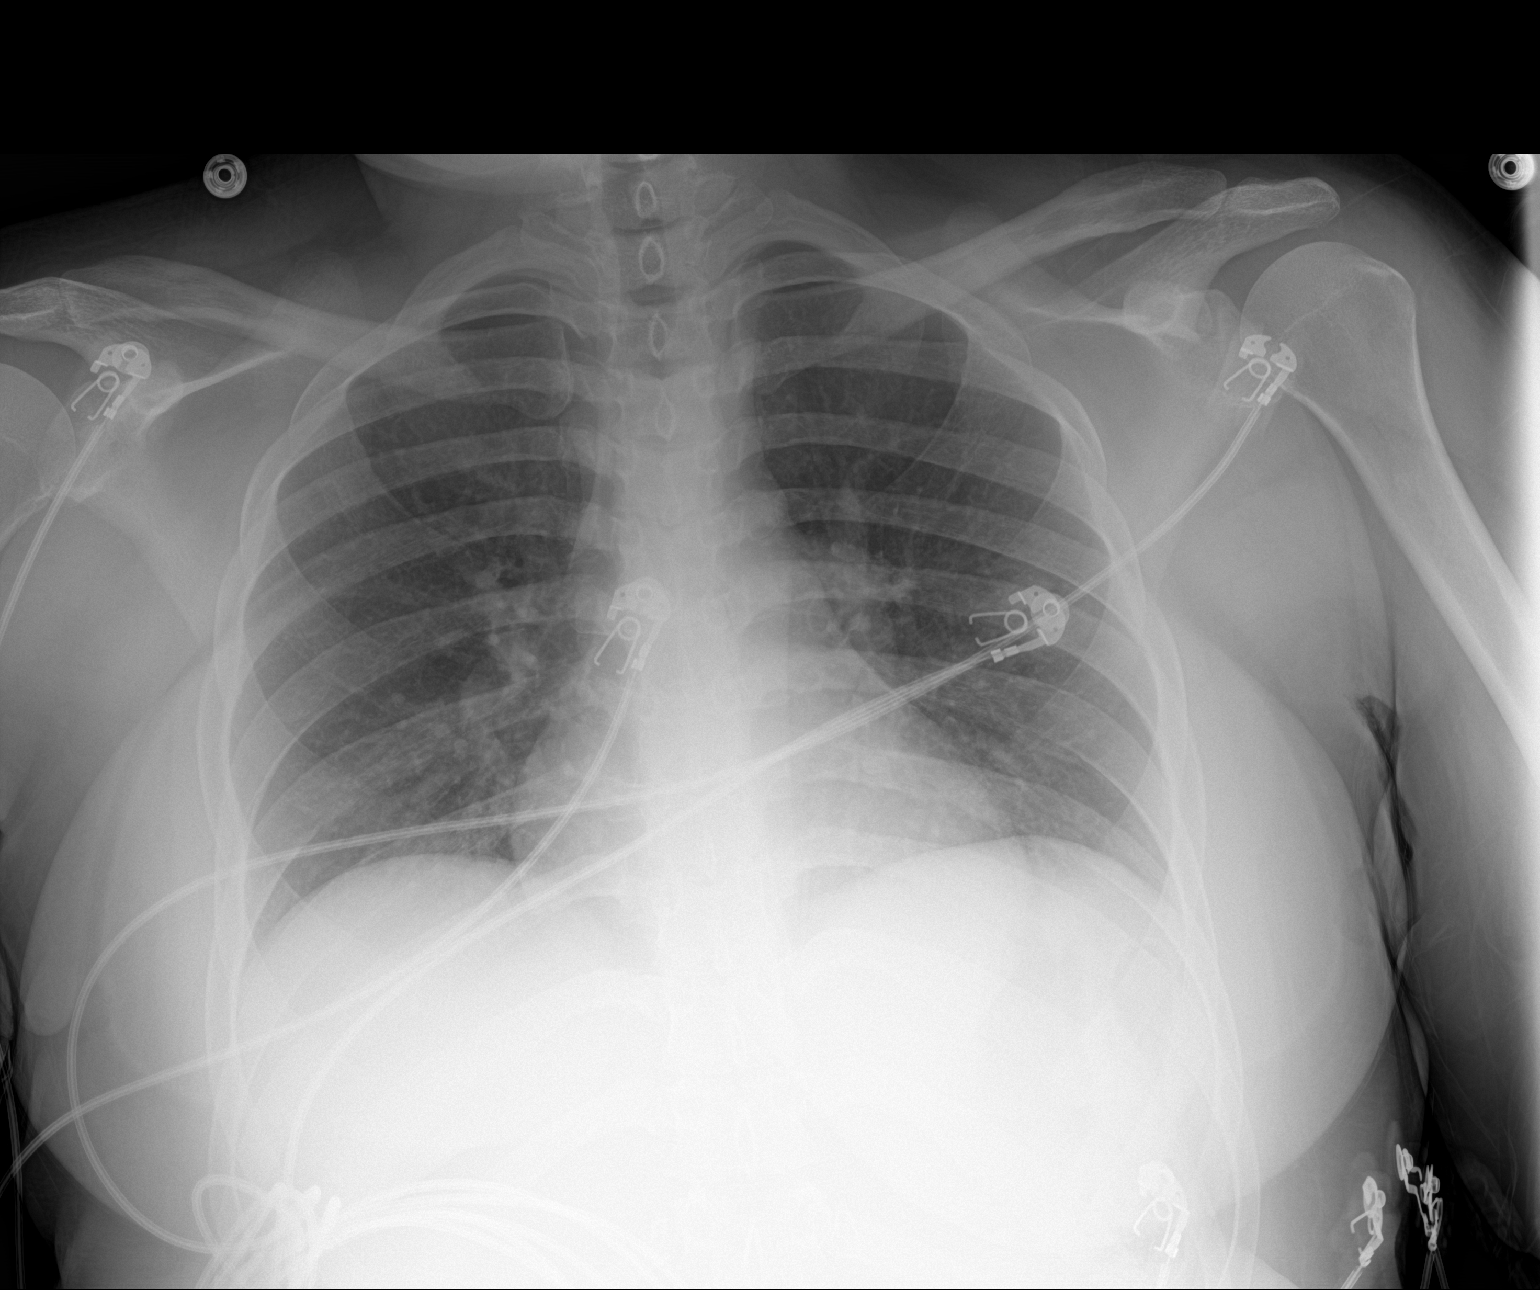

[1 of 1 positions shown; findings below may reference images not displayed]

FINDINGS: Cardiomediastinal silhouette and pulmonary vasculature are within
normal limits.

Mild patchy opacities at the lung bases favored to be atelectasis.
Lungs otherwise clear.
IMPRESSION: Mild bibasilar atelectasis.

## 2021-02-15 LAB — URINE CULTURE: Culture: >=100000 — AB

## 2021-02-17 LAB — AEROBIC/ANAEROBIC CULTURE W GRAM STAIN (SURGICAL/DEEP WOUND)

## 2021-02-18 IMAGING — DX DG CHEST 1V
1 series · 1 of 1 positions shown · non-contrast
Comparison: February 15, 2020

CLINICAL DATA: Chest pain and shortness of breath

EXAM:
CHEST  1 VIEW

[chest ap]
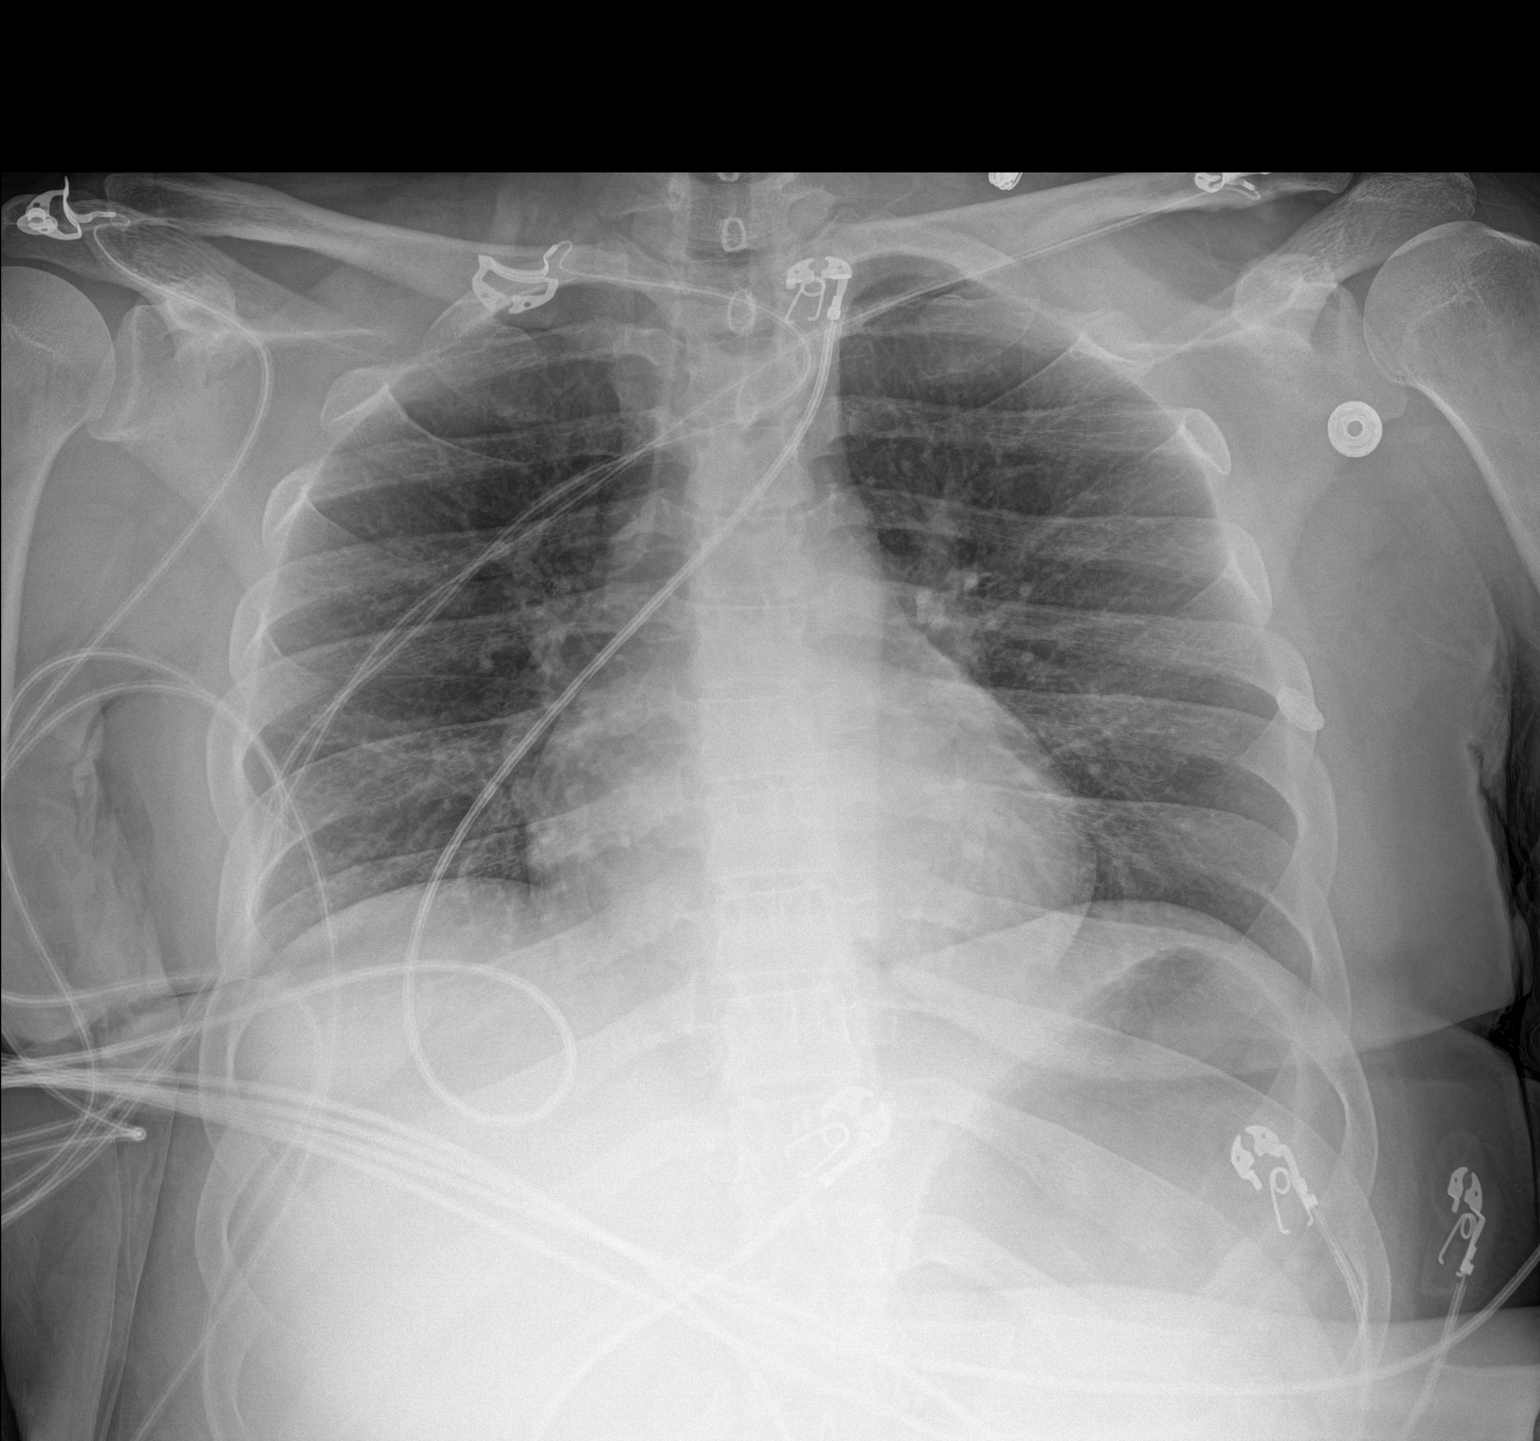

[1 of 1 positions shown; findings below may reference images not displayed]

FINDINGS: There is subtle ill-defined airspace opacity in the right lower lung
region. The lungs elsewhere are clear. Heart size and pulmonary
vascular normal. No adenopathy. No bone lesions.
IMPRESSION: Subtle ill-defined opacity right lower lung region, concerning for
developing pneumonia. This appearance warrants check of [id number]
status. Lungs elsewhere clear. Heart size normal.

## 2021-02-19 ENCOUNTER — Other Ambulatory Visit: Payer: Self-pay

## 2021-02-19 ENCOUNTER — Emergency Department
Admission: EM | Admit: 2021-02-19 | Discharge: 2021-02-19 | Disposition: A | Discharge status: Home / Self Care | Payer: Medicaid Other | Attending: Emergency Medicine | Admitting: Emergency Medicine

## 2021-02-19 ENCOUNTER — Emergency Department: Payer: Medicaid Other

## 2021-02-19 ENCOUNTER — Encounter: Payer: Self-pay | Admitting: Emergency Medicine

## 2021-02-19 DIAGNOSIS — E1065 Type 1 diabetes mellitus with hyperglycemia: Secondary | ICD-10-CM | POA: Diagnosis not present

## 2021-02-19 DIAGNOSIS — I509 Heart failure, unspecified: Secondary | ICD-10-CM | POA: Diagnosis not present

## 2021-02-19 DIAGNOSIS — R109 Unspecified abdominal pain: Secondary | ICD-10-CM | POA: Insufficient documentation

## 2021-02-19 DIAGNOSIS — R112 Nausea with vomiting, unspecified: Secondary | ICD-10-CM | POA: Insufficient documentation

## 2021-02-19 DIAGNOSIS — I110 Hypertensive heart disease with heart failure: Secondary | ICD-10-CM | POA: Insufficient documentation

## 2021-02-19 DIAGNOSIS — R200 Anesthesia of skin: Secondary | ICD-10-CM | POA: Insufficient documentation

## 2021-02-19 LAB — CBC WITH DIFFERENTIAL/PLATELET
Abs Immature Granulocytes: 0.02 10*3/uL (ref 0.00–0.07)
Basophils Absolute: 0.1 10*3/uL (ref 0.0–0.1)
Basophils Relative: 1 %
Eosinophils Absolute: 0 10*3/uL (ref 0.0–0.5)
Eosinophils Relative: 0 %
HCT: 39.2 % (ref 36.0–46.0)
Hemoglobin: 12.7 g/dL (ref 12.0–15.0)
Immature Granulocytes: 0 %
Lymphocytes Relative: 27 %
Lymphs Abs: 2.2 10*3/uL (ref 0.7–4.0)
MCH: 25 pg — ABNORMAL LOW (ref 26.0–34.0)
MCHC: 32.4 g/dL (ref 30.0–36.0)
MCV: 77.2 fL — ABNORMAL LOW (ref 80.0–100.0)
Monocytes Absolute: 0.8 10*3/uL (ref 0.1–1.0)
Monocytes Relative: 10 %
Neutro Abs: 5.1 10*3/uL (ref 1.7–7.7)
Neutrophils Relative %: 62 %
Platelets: 429 10*3/uL — ABNORMAL HIGH (ref 150–400)
RBC: 5.08 MIL/uL (ref 3.87–5.11)
RDW: 15.2 % (ref 11.5–15.5)
WBC: 8.3 10*3/uL (ref 4.0–10.5)
nRBC: 0 % (ref 0.0–0.2)

## 2021-02-19 LAB — BASIC METABOLIC PANEL
Anion gap: 7 (ref 5–15)
BUN: 8 mg/dL (ref 6–20)
CO2: 28 mmol/L (ref 22–32)
Calcium: 9 mg/dL (ref 8.9–10.3)
Chloride: 98 mmol/L (ref 98–111)
Creatinine, Ser: 0.59 mg/dL (ref 0.44–1.00)
GFR, Estimated: >60 mL/min (ref >60)
Glucose, Bld: 181 mg/dL — ABNORMAL HIGH (ref 70–99)
Potassium: 3.6 mmol/L (ref 3.5–5.1)
Sodium: 133 mmol/L — ABNORMAL LOW (ref 135–145)

## 2021-02-19 LAB — HEPATIC FUNCTION PANEL
ALT: 13 U/L (ref 0–44)
AST: 14 U/L — ABNORMAL LOW (ref 15–41)
Albumin: 4.1 g/dL (ref 3.5–5.0)
Alkaline Phosphatase: 69 U/L (ref 38–126)
Bilirubin, Direct: <0.1 mg/dL (ref 0.0–0.2)
Total Bilirubin: 0.7 mg/dL (ref 0.3–1.2)
Total Protein: 7.4 g/dL (ref 6.5–8.1)

## 2021-02-19 LAB — LIPASE, BLOOD: Lipase: 29 U/L (ref 11–51)

## 2021-02-19 MED ORDER — METOCLOPRAMIDE HCL 5 MG/ML IJ SOLN
10.0000 mg | Freq: Once | INTRAMUSCULAR | Status: AC
  Administered 2021-02-19: 10 mg via INTRAVENOUS
  Filled 2021-02-19: qty 2

## 2021-02-19 MED ORDER — ONDANSETRON 4 MG PO TBDP
4.0000 mg | ORAL_TABLET | Freq: Once | ORAL | Status: AC
  Administered 2021-02-19: 4 mg via ORAL
  Filled 2021-02-19: qty 1

## 2021-02-19 MED ORDER — HALOPERIDOL LACTATE 5 MG/ML IJ SOLN
2.0000 mg | Freq: Once | INTRAMUSCULAR | Status: AC
  Administered 2021-02-19: 2 mg via INTRAVENOUS
  Filled 2021-02-19: qty 1

## 2021-02-19 NOTE — ED Triage Notes (Signed)
Pt to ED via [facility] with complaints of having seizures and passing out that is chronic for her. She reports that the passing out and seizures have been getting worse since she was admitted two days ago. She reports they give her nausea medications and fluids she feels better than after they disconnect her fluid she begins to vomit again. No vomiting seen. Mucous membranes moist.

## 2021-02-19 NOTE — Discharge Instructions (Signed)
The MRI of your brain did not show any stroke or any other abnormality.  Your blood work was all reassuring.  Please continue to take the Reglan and follow-up with your gastroenterologist regarding your abdominal pain.

## 2021-02-19 NOTE — ED Provider Triage Note (Signed)
Emergency Medicine Provider Triage Evaluation Note  Nancy [name] , a 21 y.o. female  was evaluated in triage.  Pt complains of anxiety, depression, unspecified CHF, type 1 diabetes, DKA, pseudoseizures, and recent admission for nausea and vomiting presents to the emergency department with blurry vision of the right eye and multiple episodes of syncope as well as continued nausea and vomiting.  Patient was observed overnight for 24 hours and seemed to be tolerating a soft diet well with IV Reglan and fluids during admission. She was discharged with oral Reglan advised to follow up with endocrinology.   Review of Systems  Positive: Patient endorses nausea, vomiting, syncope and seizure-like activity. Negative: No chest pain or abdominal pain.  Physical Exam  LMP 01/30/2021  Gen:   Awake, no distress   Resp:  Normal effort  MSK:   Moves extremities without difficulty  Other:    Medical Decision Making  Medically screening exam initiated at 3:47 PM.  Appropriate orders placed.  Nancy [name] was informed that the remainder of the evaluation will be completed by another provider, this initial triage assessment does not replace that evaluation, and the importance of remaining in the ED until their evaluation is complete.     Vallarie Mare [place], [place] 02/19/21 1551

## 2021-02-19 NOTE — ED Notes (Signed)
Ekg to EDP [facility] in person. Pt given 2 more warm blankets as requested.

## 2021-02-19 NOTE — ED Provider Notes (Signed)
[facility] Provider Note    Event Date/Time   First MD Initiated Contact with Patient 02/19/21 1739     (approximate)   History   Near Syncope and Seizures   HPI  DASHAWN KARPINSKI is a 21 y.o. female past medical history of CHF, pseudoseizures, type 1 diabetes, depression and anxiety who presents with abdominal pain nausea and vomiting.  Patient was recently admitted for abdominal pain rather chronic in nature and vomiting.  She also had a necrotic lymph node that was treated with IV antibiotics.  Patient was given Reglan and ultimately was able to tolerate p.o.'s was discharged.  Patient tells me that she is here because of ongoing abdominal pain and vomiting.  She was doing well but she says a second they took out her IV she had another episode of emesis.  Has been told that it is diabetic gastroparesis in the past.  Patient also endorses numbness and weakness in her right hand as well as decreased vision from the right eye this is new over the last 24 hours.  Denies any other numbness or weakness or difficulty speaking.  She denies chest pain or shortness of breath.  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[place])    Premature baby    Seizures ([place])    Type II diabetes mellitus ([place])    insulin dependant    Patient Active Problem List   Diagnosis Date Noted   Microcytic anemia 02/12/2021   Uncontrolled type 1 diabetes mellitus with hyperglycemia, with long-term current use of insulin ([place]) 02/12/2021   Left inguinal abscess 02/12/2021   Asymptomatic bacteriuria 02/12/2021   Nicotine dependence 02/12/2021   Abdominal pain 02/22/2020   Chronic  constipation 02/22/2020   Nausea & vomiting 02/22/2020   SIRS (systemic inflammatory response syndrome) ([place]) 01/14/2020   Diabetic gastroparesis ([place]) 11/25/2019   Polysubstance abuse ([place]) 11/25/2019   Gastritis 11/25/2019   DKA, type 1 ([place]) [id number]   Cyclical vomiting with nausea 10/01/2019   Intractable nausea and vomiting 09/30/2019   Cellulitis    DKA (diabetic ketoacidoses) 01/27/2019   Leukocytosis 12/19/2018   Dehydration    Pseudoseizures ([place])    Type 1 diabetes mellitus with ketoacidosis without coma ([place]) 11/28/2018   Hyperglycemia 11/27/2018   Severe recurrent major depression without psychotic features ([place]) 11/08/2018   MDD (major depressive disorder), recurrent episode, severe ([place]) 11/06/2018   Nonspecific abnormal electrocardiogram (ECG) (EKG)    Chest pain of uncertain etiology    Hypertensive urgency 10/08/2018   Conversion disorder with attacks or seizures, acute episode, with psychological stressor 09/16/2018   MDD (major depressive disorder), recurrent severe, without psychosis ([place])    Hypokalemia 08/04/2018   AKI (acute kidney injury) ([place]) 08/04/2018   Seizure-like activity ([place]) 08/03/2018   Depression 07/25/2018   Syncope 01/30/2018   Orthostatic hypotension 01/24/2018   Tachycardia 12/28/2017   Chronic abdominal pain 12/24/2017   Chest pain 12/19/2017   Nausea and vomiting 08/21/2017   Generalized abdominal pain 08/21/2017   Non compliance with medical treatment 01/27/2012  Adjustment disorder 09/16/2011   Acanthosis nigricans    Goiter    Obesity 06/14/2010   Hypertension 06/14/2010     Physical Exam  Triage Vital Signs: ED Triage Vitals  Enc Vitals Group     BP 02/19/21 1549 (!) 145/112     Pulse Rate 02/19/21 1549 (!) 104     Resp 02/19/21 1549 16     Temp 02/19/21 1549 98.7 F (37.1 C)     Temp Source 02/19/21 1549 Oral     SpO2 02/19/21 1856 100 %     Weight 02/19/21 1550 195 lb (88.5 kg)     Height 02/19/21 1550 5\' 3"   (1.6 m)     Head Circumference --      Peak Flow --      Pain Score 02/19/21 1550 10     Pain Loc --      Pain Edu? --      Excl. in [place]? --     Most recent vital signs: Vitals:   02/19/21 1952 02/19/21 2115  BP: (!) 154/106   Pulse: (!) 102 90  Resp: 19   Temp:    SpO2: 100%      General: Awake, no distress.  CV:  Good peripheral perfusion.  Resp:  Normal effort.  Abd:  No distention.  Mildly tender in the epigastric region, overall benign Neuro:             Awake, Alert, Oriented x 3  Other:   Aox3, nml speech  PERRL, EOMI, face symmetric, nml tongue movement  Decreased strength with right grip although question effort, no pronator drift, normal sensation bilaterally Sensation grossly intact in the BL upper and lower extremities  Finger-nose-finger intact BL Normal gait    ED Results / Procedures / Treatments  Labs (all labs ordered are listed, but only abnormal results are displayed) Labs Reviewed  CBC WITH DIFFERENTIAL/PLATELET - Abnormal; Notable for the following components:      Result Value   MCV 77.2 (*)    MCH 25.0 (*)    Platelets 429 (*)    All other components within normal limits  BASIC METABOLIC PANEL - Abnormal; Notable for the following components:   Sodium 133 (*)    Glucose, Bld 181 (*)    All other components within normal limits  HEPATIC FUNCTION PANEL - Abnormal; Notable for the following components:   AST 14 (*)    All other components within normal limits  LIPASE, BLOOD     EKG  Sinus tachycardia, normal axis normal intervals no acute ischemic changes, EKG reviewed by myself   RADIOLOGY I reviewed the CT scan of the brain which does not show any acute intracranial process; agree with radiology report     PROCEDURES:   MEDICATIONS ORDERED IN ED: Medications  ondansetron (ZOFRAN-ODT) disintegrating tablet 4 mg (4 mg Oral Given 02/19/21 1554)  haloperidol lactate (HALDOL) injection 2 mg (2 mg Intravenous Given 02/19/21 1945)   metoCLOPramide (REGLAN) injection 10 mg (10 mg Intravenous Given 02/19/21 1943)     IMPRESSION / MDM / ASSESSMENT AND PLAN / ED COURSE  I reviewed the triage vital signs and the nursing notes.                              Differential diagnosis includes, but is not limited to, diabetic gastroparesis, cyclic vomiting, gastritis, pancreatitis, pseudoseizures  Patient is a 21 year old female who presents with ongoing  nausea and vomiting.  On initial evaluation patient is found lying on the floor facedown.  There was no witnessed fall or seizure-like activity.  Upon awakening her she initially is somewhat confused however then when we started asking her questions she is back to baseline and able to say that when she was discharged several days ago she immediately started having vomiting again.  Patient does not appear to be postictal.  Tells me that she has new weakness in her right hand and ongoing vomiting and worsening of her chronic abdominal pain.  She has Reglan at home which has not been working.  On abdominal exam she is mildly tender in the epigastric region.  He had a CT done several days ago with no acute findings other than the chronic lymph nodes so do not feel that repeat imaging is necessary.  All of her blood work today including a CBC CMP and lipase are all reassuring.  Patient telling me she has new weakness in her right hand, neurologic exam she does have decree strength in the right grip however I question whether this is effort dependent.  She has no other neurologic findings does states she has decreased vision in the right eye.  CT head obtained primarily because patient was found on the floor question whether head injury which is negative.  Given this questionable neurologic's finding I did obtain an MRI of the brain without contrast to rule out CVA and this is negative.  Patient was given Haldol, and Zofran for her vomiting.  On reassessment she feels much better is asking to eat and  is comfortable with discharge home.  Given her abdominal pain is chronic and she is now tolerating p.o. I think this is appropriate.   FINAL CLINICAL IMPRESSION(S) / ED DIAGNOSES   Final diagnoses:  Nausea and vomiting, unspecified vomiting type     Rx / DC Orders   ED Discharge Orders     None        Note:  This document was prepared using Dragon voice recognition software and may include unintentional dictation errors.   Rada Hay, MD 02/19/21 2234

## 2021-02-19 NOTE — ED Notes (Signed)
[place] alarm placed under a pt.

## 2021-02-20 IMAGING — DX DG CHEST 1V PORT
1 series · 1 of 1 positions shown · non-contrast
Comparison: February 19, 2020

CLINICAL DATA: Vomiting.

EXAM:
PORTABLE CHEST 1 VIEW

[chest ap]
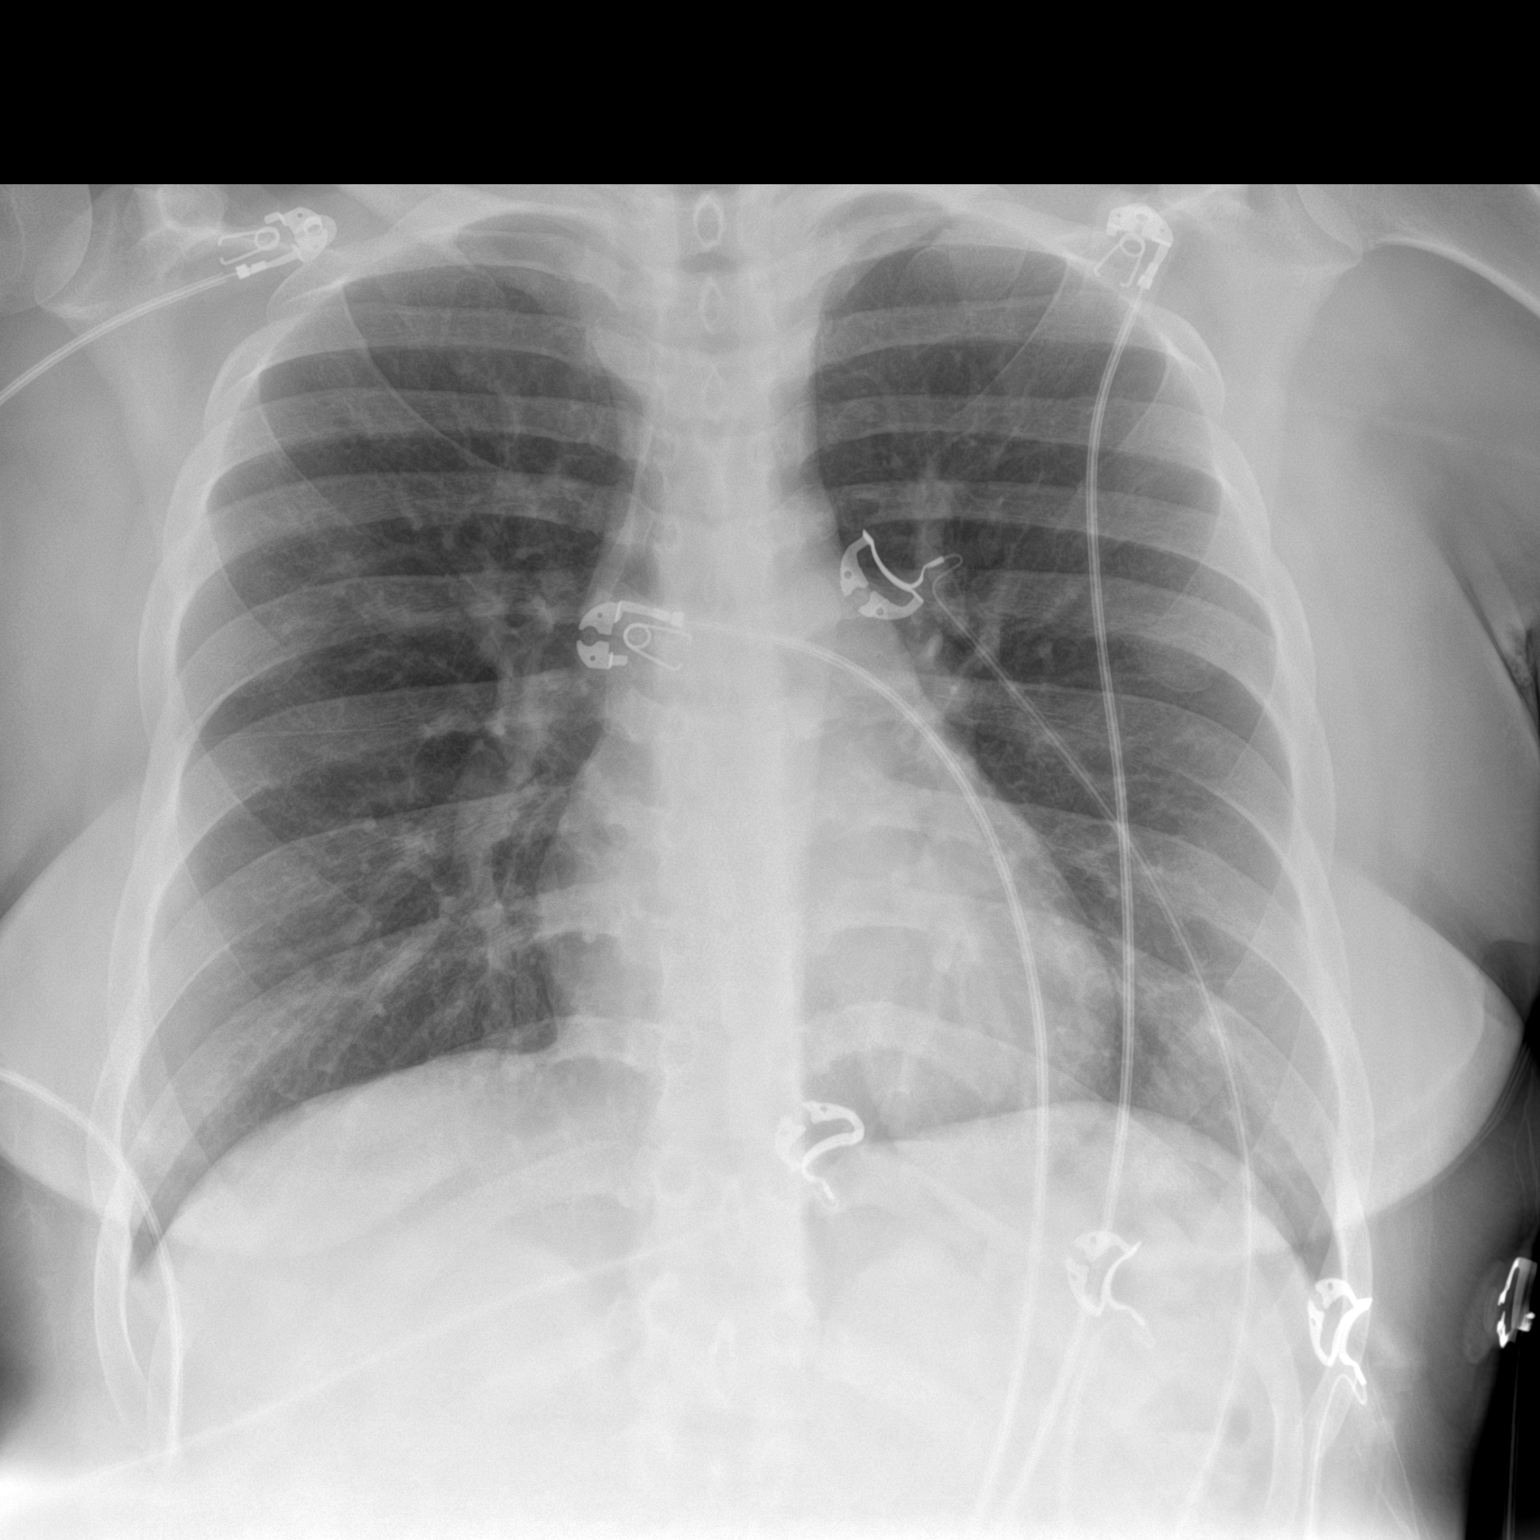

[1 of 1 positions shown; findings below may reference images not displayed]

FINDINGS: The heart size and mediastinal contours are within normal limits.
Both lungs are clear. The visualized skeletal structures are
unremarkable.
IMPRESSION: No active disease.

## 2021-02-21 IMAGING — DX DG ABDOMEN 1V
1 series · 2 of 2 positions shown · non-contrast
Comparison: 01/14/2020

CLINICAL DATA: Abdominal pain

EXAM:
ABDOMEN - 1 VIEW

[Series 1: abdomen · 0.14mm/px · 2 of 2 slices shown]
[im 1/2]
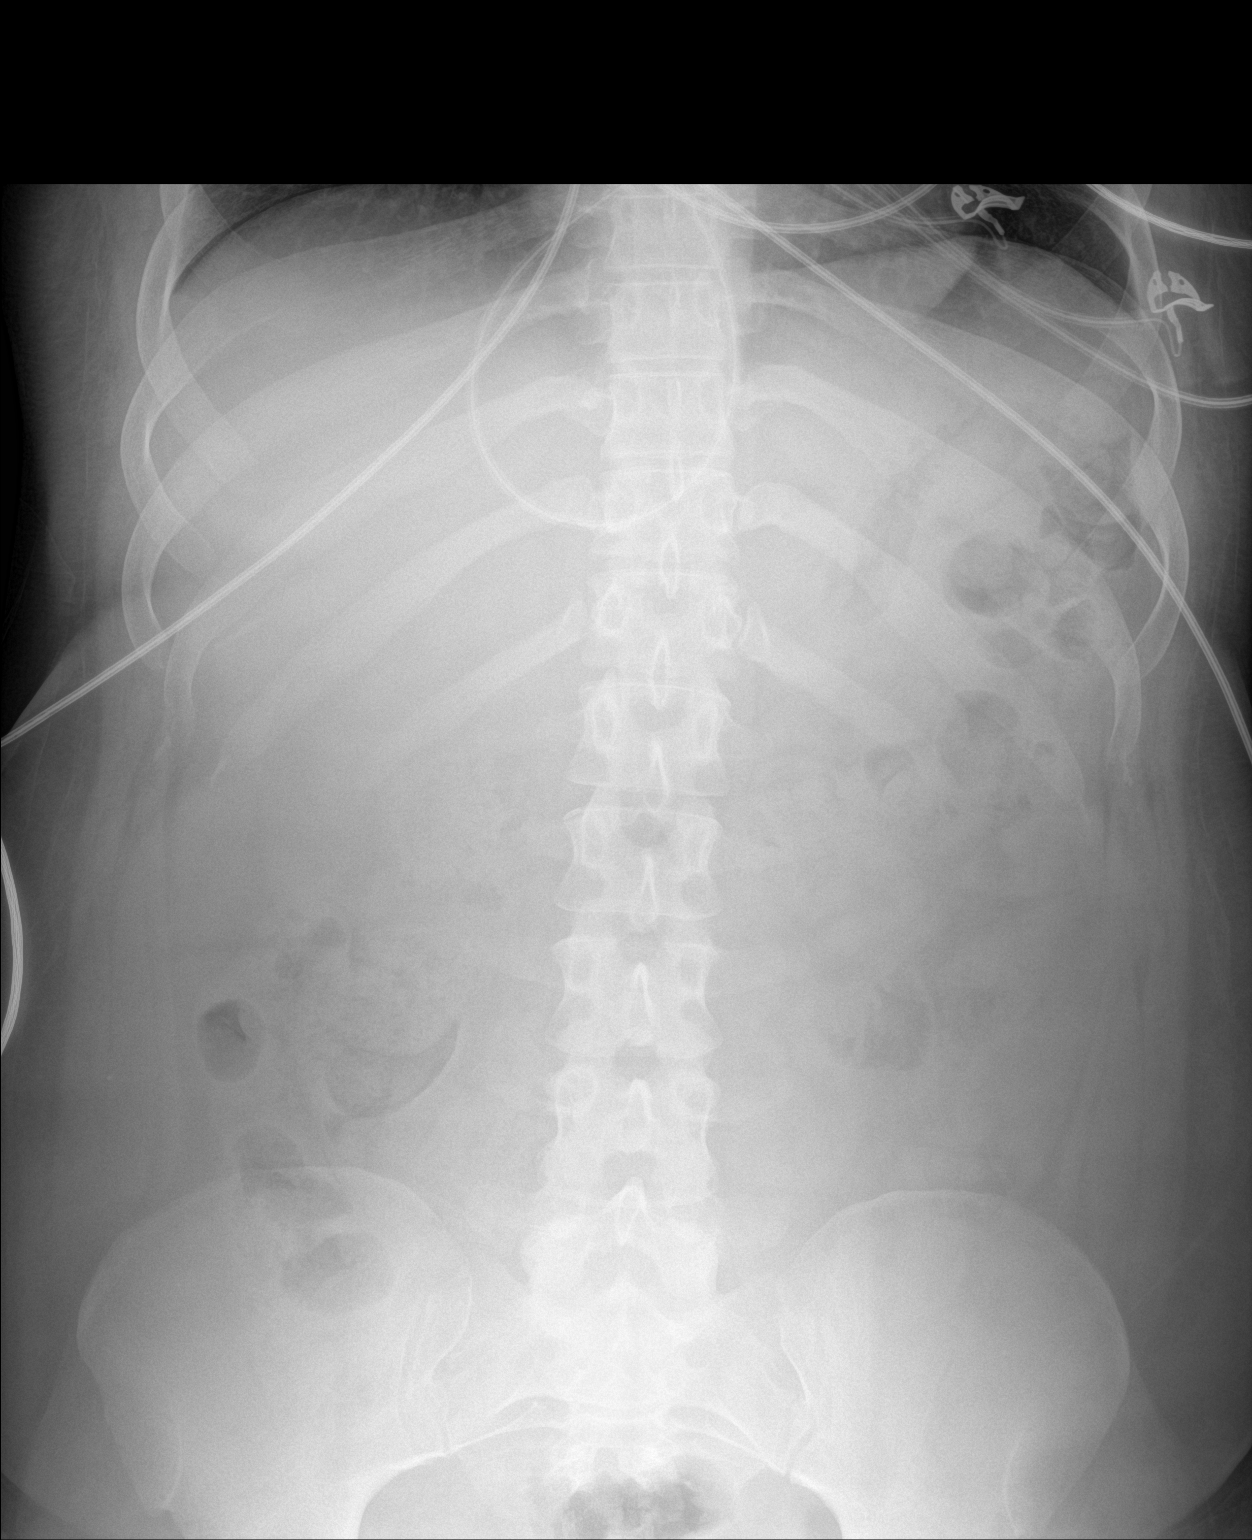
[im 2/2]
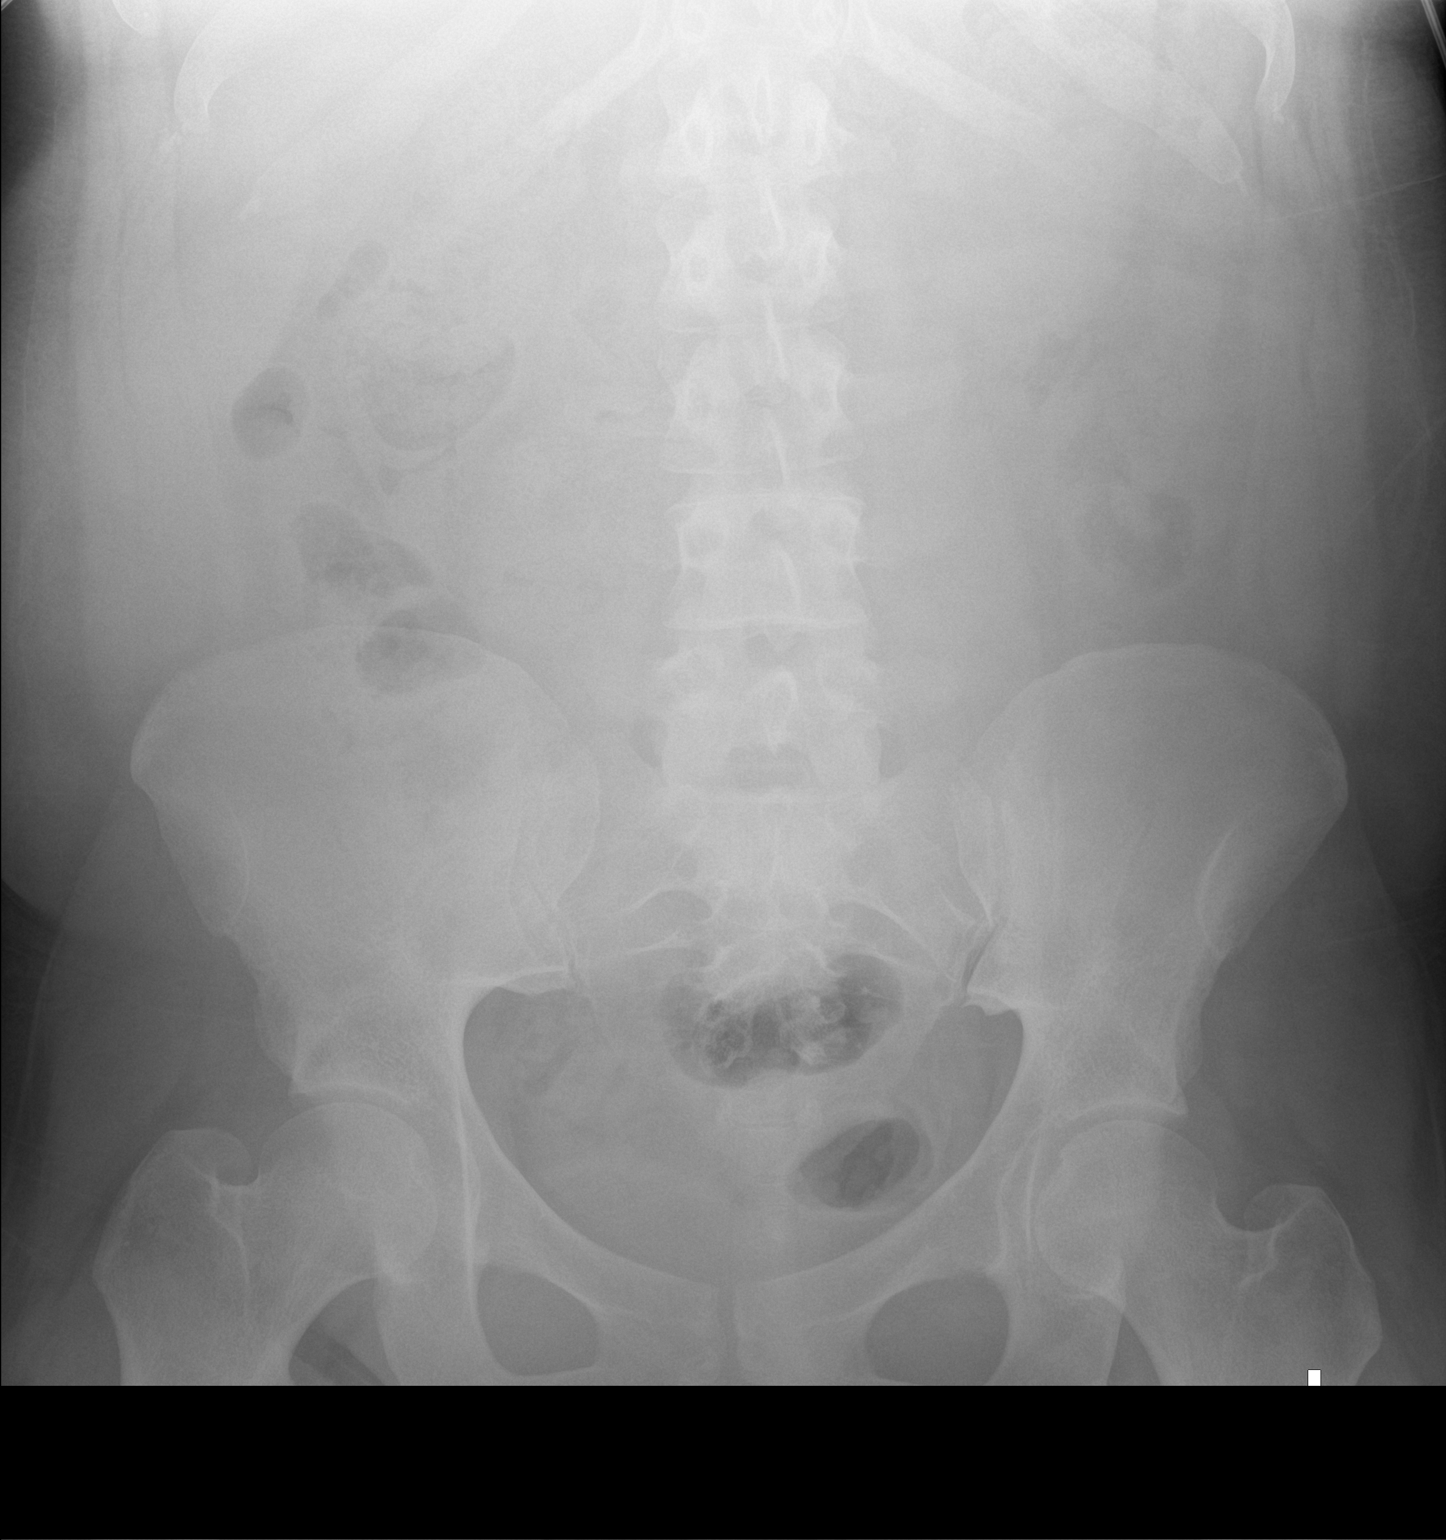

[2 of 2 positions shown; findings below may reference images not displayed]

FINDINGS: The bowel gas pattern is normal. No radio-opaque calculi or other
significant radiographic abnormality are seen.
IMPRESSION: Negative.

## 2021-03-07 ENCOUNTER — Emergency Department: Payer: Medicaid Other

## 2021-03-07 ENCOUNTER — Emergency Department
Admission: EM | Admit: 2021-03-07 | Discharge: 2021-03-07 | Disposition: A | Discharge status: Home / Self Care | Payer: Medicaid Other | Attending: Student in an Organized Health Care Education/Training Program | Admitting: Student in an Organized Health Care Education/Training Program

## 2021-03-07 DIAGNOSIS — R569 Unspecified convulsions: Secondary | ICD-10-CM | POA: Diagnosis present

## 2021-03-07 DIAGNOSIS — R112 Nausea with vomiting, unspecified: Secondary | ICD-10-CM | POA: Insufficient documentation

## 2021-03-07 DIAGNOSIS — E109 Type 1 diabetes mellitus without complications: Secondary | ICD-10-CM | POA: Diagnosis not present

## 2021-03-07 LAB — COMPREHENSIVE METABOLIC PANEL
ALT: 20 U/L (ref 0–44)
AST: 19 U/L (ref 15–41)
Albumin: 4.4 g/dL (ref 3.5–5.0)
Alkaline Phosphatase: 74 U/L (ref 38–126)
Anion gap: 10 (ref 5–15)
BUN: 11 mg/dL (ref 6–20)
CO2: 26 mmol/L (ref 22–32)
Calcium: 9.5 mg/dL (ref 8.9–10.3)
Chloride: 100 mmol/L (ref 98–111)
Creatinine, Ser: 0.71 mg/dL (ref 0.44–1.00)
GFR, Estimated: >60 mL/min (ref >60)
Glucose, Bld: 267 mg/dL — ABNORMAL HIGH (ref 70–99)
Potassium: 3.6 mmol/L (ref 3.5–5.1)
Sodium: 136 mmol/L (ref 135–145)
Total Bilirubin: 1.4 mg/dL — ABNORMAL HIGH (ref 0.3–1.2)
Total Protein: 7.8 g/dL (ref 6.5–8.1)

## 2021-03-07 LAB — CBC WITH DIFFERENTIAL/PLATELET
Abs Immature Granulocytes: 0.02 10*3/uL (ref 0.00–0.07)
Basophils Absolute: 0.1 10*3/uL (ref 0.0–0.1)
Basophils Relative: 1 %
Eosinophils Absolute: 0 10*3/uL (ref 0.0–0.5)
Eosinophils Relative: 0 %
HCT: 40.3 % (ref 36.0–46.0)
Hemoglobin: 12.9 g/dL (ref 12.0–15.0)
Immature Granulocytes: 0 %
Lymphocytes Relative: 24 %
Lymphs Abs: 1.9 10*3/uL (ref 0.7–4.0)
MCH: 24.9 pg — ABNORMAL LOW (ref 26.0–34.0)
MCHC: 32 g/dL (ref 30.0–36.0)
MCV: 77.8 fL — ABNORMAL LOW (ref 80.0–100.0)
Monocytes Absolute: 0.7 10*3/uL (ref 0.1–1.0)
Monocytes Relative: 8 %
Neutro Abs: 5.3 10*3/uL (ref 1.7–7.7)
Neutrophils Relative %: 67 %
Platelets: 285 10*3/uL (ref 150–400)
RBC: 5.18 MIL/uL — ABNORMAL HIGH (ref 3.87–5.11)
RDW: 14.8 % (ref 11.5–15.5)
WBC: 8 10*3/uL (ref 4.0–10.5)
nRBC: 0 % (ref 0.0–0.2)

## 2021-03-07 LAB — CBG MONITORING, ED: Glucose-Capillary: 256 mg/dL — ABNORMAL HIGH (ref 70–99)

## 2021-03-07 LAB — LIPASE, BLOOD: Lipase: 29 U/L (ref 11–51)

## 2021-03-07 MED ORDER — LACTATED RINGERS IV BOLUS
1000.0000 mL | Freq: Once | INTRAVENOUS | Status: AC
Start: 2021-03-07 — End: 2021-03-07
  Administered 2021-03-07: 1000 mL via INTRAVENOUS

## 2021-03-07 MED ORDER — DROPERIDOL 2.5 MG/ML IJ SOLN
2.5000 mg | Freq: Once | INTRAMUSCULAR | Status: AC
  Administered 2021-03-07: 2.5 mg via INTRAVENOUS
  Filled 2021-03-07: qty 2

## 2021-03-07 MED ORDER — LORAZEPAM 2 MG/ML IJ SOLN
INTRAMUSCULAR | Status: AC
  Administered 2021-03-07: 1 mg via INTRAVENOUS
  Filled 2021-03-07: qty 1

## 2021-03-07 MED ORDER — LAMOTRIGINE 25 MG PO TABS
50.0000 mg | ORAL_TABLET | Freq: Two times a day (BID) | ORAL | Status: DC
  Administered 2021-03-07: 50 mg via ORAL
  Filled 2021-03-07: qty 2

## 2021-03-07 MED ORDER — INSULIN ASPART 100 UNIT/ML IJ SOLN
6.0000 [IU] | Freq: Once | INTRAMUSCULAR | Status: AC
  Administered 2021-03-07: 6 [IU] via SUBCUTANEOUS
  Filled 2021-03-07: qty 1

## 2021-03-07 MED ORDER — OXYCODONE-ACETAMINOPHEN 5-325 MG PO TABS
1.0000 | ORAL_TABLET | Freq: Once | ORAL | Status: AC
  Administered 2021-03-07: 1 via ORAL
  Filled 2021-03-07: qty 1

## 2021-03-07 MED ORDER — PROPRANOLOL HCL 20 MG PO TABS
20.0000 mg | ORAL_TABLET | Freq: Two times a day (BID) | ORAL | Status: DC
  Administered 2021-03-07: 20 mg via ORAL
  Filled 2021-03-07: qty 1

## 2021-03-07 MED ORDER — SODIUM CHLORIDE 0.9 % IV BOLUS
1000.0000 mL | Freq: Once | INTRAVENOUS | Status: AC
  Administered 2021-03-07: 1000 mL via INTRAVENOUS

## 2021-03-07 MED ORDER — LEVETIRACETAM IN NACL 1000 MG/100ML IV SOLN
1000.0000 mg | Freq: Once | INTRAVENOUS | Status: AC
  Administered 2021-03-07: 1000 mg via INTRAVENOUS
  Filled 2021-03-07: qty 100

## 2021-03-07 MED ORDER — LORAZEPAM 2 MG/ML IJ SOLN: 1.0000 mg | Freq: Once | INTRAMUSCULAR | Status: AC

## 2021-03-07 NOTE — ED Notes (Signed)
Seizure pads in place. Pt is able to communicate with this RN now. Pt reports head, neck and abdominal pain.  ?

## 2021-03-07 NOTE — ED Notes (Addendum)
RN at bedside due to pt oxygen sats reading 70%. Pt on left side and appeared to be convulsing. Convulsions lasted approximately 1 minute. Pt placed on NRB. Another staff RN received verbal order for ativan. Pt A&Ox4 after episode and taken off NRB. RA sats 95-97%.  ?

## 2021-03-07 NOTE — ED Triage Notes (Addendum)
Pt to ED via [facility] from home. EMS reports pt has had a total of 22 seizures over the past several days. Pt lives at home with family. EMS states family found pt on the floor unresponsive this morning. EMS reports 3 witnessed seizures in route each lasting about 30secs. EMS states pt is usually A&Ox4 after a seizure but is disoriented x4. No medication given PTA. Pt post ictal on arrival and unable to answer questions. CBG 300. Pt with hx epilepsy.  ?

## 2021-03-07 NOTE — ED Notes (Signed)
Still no urine noted in purewick container. Pt asked if she could provide urine sample at this time but states unable to at this time.  ?

## 2021-03-07 NOTE — ED Notes (Signed)
This RN at bedside to encourage pt to void. Pt stating she still feels unable to void. This RN explaining importance of urine specimen collection. RN further explaining in and out cath. Pt refusing in and out cath. Pt stating, "I will try to squeeze something out."  ?

## 2021-03-07 NOTE — ED Provider Notes (Signed)
? ?[facility] ?Provider Note ? ? ? Event Date/Time  ? First MD Initiated Contact with Patient 03/07/21 [phone]   ?  (approximate) ? ? ?History  ? ?Seizures ? ?Level V Caveat:  AMS ? ?HPI ? ?Nancy [name] is a 21 y.o. female presents the ER with a history of type 1 diabetes poorly controlled, pseudoseizure, anxiety depression recently admission did for multiple episodes of nausea vomiting presents to the ER via EMS after reported 22 seizures over the past few days.  Reportedly had 3 episodes in the ambulance.  Patient moaning and moving around in bed not providing much additional history at this time. ?  ? ? ?Physical Exam  ? ?Triage Vital Signs: ?ED Triage Vitals  ?Enc Vitals Group  ?   BP 03/07/21 0945 (!) 148/115  ?   Pulse Rate 03/07/21 0945 (!) 58  ?   Resp 03/07/21 0945 (!) 29  ?   Temp --   ?   Temp src --   ?   SpO2 03/07/21 0945 100 %  ?   Weight 03/07/21 0941 194 lb 0.1 oz (88 kg)  ?   Height 03/07/21 0941 5\' 3"  (1.6 m)  ?   Head Circumference --   ?   Peak Flow --   ?   Pain Score --   ?   Pain Loc --   ?   Pain Edu? --   ?   Excl. in GC? --   ? ? ?Most recent vital signs: ?Vitals:  ? 03/07/21 1330 03/07/21 1500  ?BP: 131/89 (!) 139/94  ?Pulse: (!) 104 99  ?Resp: 18 17  ?Temp:    ?SpO2: 97% 100%  ? ? ? ?Constitutional: Patient protecting airway rolling about in bed very anxious appearing. ?Eyes: Conjunctivae are normal. Pupils 2mm reactic ?Head: Atraumatic. ?Nose: No congestion/rhinnorhea. ?Mouth/Throat: Mucous membranes are moist.   ?Neck: Painless ROM.  ?Cardiovascular:   Good peripheral circulation. Normal heart sounds ?Respiratory: mildly tachypneic ?Gastrointestinal: Soft and nontender.  ?Musculoskeletal:  no deformity ?Neurologic:  MAE spontaneously. No lateralizing findings ?Skin:  Skin is warm, dry and intact. No rash noted. ?Psychiatric: anxious ? ? ? ?ED Results / Procedures / Treatments  ? ?Labs ?(all labs ordered are listed, but only abnormal results are displayed) ?Labs  Reviewed  ?COMPREHENSIVE METABOLIC PANEL - Abnormal; Notable for the following components:  ?    Result Value  ? Glucose, Bld 267 (*)   ? Total Bilirubin 1.4 (*)   ? All other components within normal limits  ?CBC WITH DIFFERENTIAL/PLATELET - Abnormal; Notable for the following components:  ? RBC 5.18 (*)   ? MCV 77.8 (*)   ? MCH 24.9 (*)   ? All other components within normal limits  ?CBG MONITORING, ED - Abnormal; Notable for the following components:  ? Glucose-Capillary 256 (*)   ? All other components within normal limits  ?LIPASE, BLOOD  ?URINE DRUG SCREEN, QUALITATIVE ([facility] ONLY)  ?URINALYSIS, COMPLETE (UACMP) WITH MICROSCOPIC  ?POC URINE PREG, ED  ? ? ? ?EKG ? ?ED ECG REPORT ?I, Willy Eddy, the attending physician, personally viewed and interpreted this ECG. ? ? Date: 03/07/2021 ? EKG Time: 9:53 ? Rate: 110 ? Rhythm: sinus ? Axis: normal ? Intervals: normal ? ST&T Change: no stemi ? ? ? ?RADIOLOGY ?Please see ED Course for my review and interpretation. ? ?I personally reviewed all radiographic images ordered to evaluate for the above acute complaints and reviewed radiology reports and findings.  These findings were personally discussed with the patient.  Please see medical record for radiology report. ? ? ? ?PROCEDURES: ? ?Critical Care performed: No ? ?Procedures ? ? ?MEDICATIONS ORDERED IN ED: ?Medications  ?propranolol (INDERAL) tablet 20 mg (20 mg Oral Given 03/07/21 1344)  ?lamoTRIgine (LAMICTAL) tablet 50 mg (50 mg Oral Given 03/07/21 1411)  ?insulin aspart (novoLOG) injection 6 Units (has no administration in time range)  ?LORazepam (ATIVAN) injection 1 mg (1 mg Intravenous Given 03/07/21 1008)  ?levETIRAcetam (KEPPRA) IVPB 1000 mg/100 mL premix (0 mg Intravenous Stopped 03/07/21 1147)  ?sodium chloride 0.9 % bolus 1,000 mL (1,000 mLs Intravenous New Bag/Given 03/07/21 1101)  ?droperidol (INAPSINE) 2.5 MG/ML injection 2.5 mg (2.5 mg Intravenous Given 03/07/21 1122)  ?lactated ringers bolus 1,000 mL (1,000  mLs Intravenous New Bag/Given 03/07/21 1253)  ?oxyCODONE-acetaminophen (PERCOCET/ROXICET) 5-325 MG per tablet 1 tablet (1 tablet Oral Given 03/07/21 1411)  ? ? ? ?IMPRESSION / MDM / ASSESSMENT AND PLAN / ED COURSE  ?I reviewed the triage vital signs and the nursing notes. ?             ?               ? ?Differential diagnosis includes, but is not limited to, Dehydration, sepsis, pna, uti, hypoglycemia, cva, drug effect, withdrawal,  ? ?Patient presenting with symptoms as described above.  She protecting her airway.  Bizarre behavior very anxious appearing.  Has had similar presentations in the past on review of her medical charts.  Reporting multiple episodes of nausea vomiting.  Will order neuroimaging but her abdominal exam is soft and benign.  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  States that she takes Lamictal for this but per chart she is on this by her psychiatrist for mood stabilization.  States he has not been taking her medications for few days due to nausea.  Has had extensive work-up by GI specialist as well.  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  Given her hyperglycemia will give IV fluids patient was given dose of Ativan given episode of shaking upon arrival as well as a dose of Keppra but on further review I have a lower suspicion that this is seizures or epilepsy.  The patient will be placed on continuous pulse oximetry and telemetry for monitoring.  Laboratory evaluation will be sent to evaluate for the above complaints.   ? ? ?Clinical Course as of 03/07/21 1526  ?Thu Mar 07, 2021  ?1050 CT head my review does not show any evidence of bleed. [PR]  ?1103 Patient shaking in bed feels like she is having a seizure that she is speaking in complete sentences following commands.  States that she is hurting all over and asking for something for pain but states that she has been having these episodes for the past several days and has tried "everything for nausea."  [PR]  ?1103 I will order IV droperidol with IV fluids.  I do not believe this reflects status epilepticus based on her exam at this time.  Especially with her report of "22 seizures over the past several days if these were true epileptic seizures I would have expected more metabolic derangements at this time her CMP is otherwise unremarkable for acidemia or anion gap.  Renal function normal.  Glucose is elevated.  Does have a history of gastroparesis.  Her abdominal exam is benign.  She has had multiple CT imaging for similar presentations in the past given  lack of white count or fever will observe and reassess. [PR]  ?1322 Patient currently resting comfortably after IV droperidol.  Heart rate normal.  We will continue observe. [PR]  ?1404 Patient reassessed.  Still following commands.  States that she is primarily having episodes of nausea vomiting no seizure-like activity.  States that she has been having trouble keeping food down or about gastroparesis.  She feels much improved after IV droperidol.  States that she is not taking her chronic pain management medication in 2 days.  Will give oral Percocet which she states that she takes at home as she may be having some withdrawal symptoms. [PR]  ?  ?Clinical Course User Index ?[PR] Willy Eddy, MD  ? ?Patient still has not provided urine sample and IV fluids are slowly going and she keeps on bending her arm and resting in bed.  No persistent shaking spells or seizure-like activity.  Not consistent with status.  Patient be signed out to oncoming physician pending IV fluids UA.  Anticipate discharge home. ? ?FINAL CLINICAL IMPRESSION(S) / ED DIAGNOSES  ? ?Final diagnoses:  ?Nausea and vomiting, unspecified vomiting type  ?Seizure-like activity (HCC)  ? ? ? ?Rx / DC Orders  ? ?ED Discharge Orders   ? ? None  ? ?  ? ? ? ?Note:  This document was prepared using Dragon voice recognition software and may include unintentional dictation errors. ? ?  ?Willy Eddy, MD ?03/07/21 1526 ? ?

## 2021-03-07 NOTE — ED Provider Notes (Signed)
Vitals:  ? 03/07/21 1530 03/07/21 1730  ?BP: (!) 138/95 115/86  ?Pulse: 92 97  ?Resp: 15 15  ?Temp:    ?SpO2: 96% 100%  ? ? ? ?Patient has not yet provided a urine specimen but she is requesting to be discharged. ? ?Patient is fully alert and oriented she reports she feels much better.  No ongoing nausea or vomiting not any pain.  She would like to receive a work note for today and tomorrow, but feels very comfortable to plan to go home.  Her mother is going to come pick her up ? ?She is awake alert well-oriented reports all symptoms now under control feels much improved.  I did discuss with her she denies that she is pregnant, she reports her last menstrual cycle was about 1 month ago she is due now.  She has had similar symptoms to this in the past.  She has not had any pain burning or symptoms of "urinary tract infection."  I believe at this point we can forego obtaining a urinalysis ? ?Patient provided discharge follow-up instructions and return precautions.  Alert fully oriented no distress mother driving her home ?  ?Sharyn Creamer, MD ?03/07/21 1745 ? ?

## 2021-04-09 ENCOUNTER — Emergency Department
Admission: EM | Admit: 2021-04-09 | Discharge: 2021-04-09 | Disposition: A | Discharge status: Home / Self Care | Payer: Medicaid Other | Attending: Emergency Medicine | Admitting: Emergency Medicine

## 2021-04-09 ENCOUNTER — Other Ambulatory Visit: Payer: Self-pay

## 2021-04-09 DIAGNOSIS — R000 Tachycardia, unspecified: Secondary | ICD-10-CM | POA: Diagnosis not present

## 2021-04-09 DIAGNOSIS — R42 Dizziness and giddiness: Secondary | ICD-10-CM | POA: Insufficient documentation

## 2021-04-09 DIAGNOSIS — I509 Heart failure, unspecified: Secondary | ICD-10-CM | POA: Diagnosis not present

## 2021-04-09 DIAGNOSIS — I110 Hypertensive heart disease with heart failure: Secondary | ICD-10-CM | POA: Insufficient documentation

## 2021-04-09 DIAGNOSIS — R112 Nausea with vomiting, unspecified: Secondary | ICD-10-CM | POA: Diagnosis present

## 2021-04-09 DIAGNOSIS — E1010 Type 1 diabetes mellitus with ketoacidosis without coma: Secondary | ICD-10-CM | POA: Insufficient documentation

## 2021-04-09 DIAGNOSIS — Z794 Long term (current) use of insulin: Secondary | ICD-10-CM | POA: Insufficient documentation

## 2021-04-09 LAB — CBC
HCT: 34.9 % — ABNORMAL LOW (ref 36.0–46.0)
Hemoglobin: 11.1 g/dL — ABNORMAL LOW (ref 12.0–15.0)
MCH: 25.2 pg — ABNORMAL LOW (ref 26.0–34.0)
MCHC: 31.8 g/dL (ref 30.0–36.0)
MCV: 79.1 fL — ABNORMAL LOW (ref 80.0–100.0)
Platelets: 279 10*3/uL (ref 150–400)
RBC: 4.41 MIL/uL (ref 3.87–5.11)
RDW: 15.2 % (ref 11.5–15.5)
WBC: 6.6 10*3/uL (ref 4.0–10.5)
nRBC: 0 % (ref 0.0–0.2)

## 2021-04-09 LAB — BASIC METABOLIC PANEL
Anion gap: 9 (ref 5–15)
BUN: 9 mg/dL (ref 6–20)
CO2: 25 mmol/L (ref 22–32)
Calcium: 8.9 mg/dL (ref 8.9–10.3)
Chloride: 103 mmol/L (ref 98–111)
Creatinine, Ser: 0.52 mg/dL (ref 0.44–1.00)
GFR, Estimated: >60 mL/min (ref >60)
Glucose, Bld: 172 mg/dL — ABNORMAL HIGH (ref 70–99)
Potassium: 4.1 mmol/L (ref 3.5–5.1)
Sodium: 137 mmol/L (ref 135–145)

## 2021-04-09 LAB — HCG, QUANTITATIVE, PREGNANCY: hCG, Beta Chain, Quant, S: <1 m[IU]/mL (ref <5)

## 2021-04-09 MED ORDER — METOCLOPRAMIDE HCL 10 MG PO TABS
10.0000 mg | ORAL_TABLET | Freq: Once | ORAL | Status: AC
  Administered 2021-04-09: 10 mg via ORAL
  Filled 2021-04-09: qty 1

## 2021-04-09 MED ORDER — LAMOTRIGINE 25 MG PO TABS
25.0000 mg | ORAL_TABLET | Freq: Once | ORAL | Status: AC
Start: 2021-04-09 — End: 2021-04-09
  Administered 2021-04-09: 25 mg via ORAL
  Filled 2021-04-09: qty 1

## 2021-04-09 MED ORDER — METOCLOPRAMIDE HCL 10 MG PO TABS: 10.0000 mg | ORAL_TABLET | Freq: Three times a day (TID) | ORAL | 0 refills | Status: DC

## 2021-04-09 NOTE — Discharge Instructions (Addendum)
I sent your prescription for Reglan to your pharmacy. ?

## 2021-04-09 NOTE — ED Triage Notes (Signed)
Pt comes via EMS with c/o syncopal episodes this am. Pt states CP that is stabbing 8/10 pain. Pt takes all prescribed medications. Pt states she has had 10 seizures this am.  ? ?VSS ?CBG-200 ?

## 2021-04-10 NOTE — ED Provider Notes (Signed)
? ?[facility] ?Provider Note ? ? ? Event Date/Time  ? First MD Initiated Contact with Patient 04/09/21 1626   ?  (approximate) ? ? ?History  ? ?Near Syncope ? ? ?HPI ? ?Nancy [name] is a 21 y.o. female   with past medical history of diabetes, chronic nausea and vomiting, seizure disorder presents with vomiting.  Patient notes that she ran out of her Reglan 2 days ago and has since not been able to keep down her Lamictal.  Patient typically takes her Reglan every 8 hours and when she does not have it she has nausea and vomiting.  Denies abdominal pain fevers chills.  Does feel somewhat lightheaded with standing denies chest pain or shortness of breath.  Has had multiple seizures.  Patient tells me she has several seizures a week typically.  Denies any injuries. ? ?  ? ?Past Medical History:  ?Diagnosis Date  ? Acanthosis nigricans   ? Anxiety   ? CHF (congestive heart failure) (HCC)   ? Chronic lower back pain   ? Depression   ? DKA, type 1 (HCC) 09/13/2018  ? Dyspepsia   ? Obesity   ? Ovarian cyst   ? pt is not aware of this hx (11/24/2017)  ? Precocious adrenarche (HCC)   ? Premature baby   ? Seizures (HCC)   ? Type II diabetes mellitus (HCC)   ? insulin dependant  ? ? ?Patient Active Problem List  ? Diagnosis Date Noted  ? Microcytic anemia 02/12/2021  ? Uncontrolled type 1 diabetes mellitus with hyperglycemia, with long-term current use of insulin (HCC) 02/12/2021  ? Left inguinal abscess 02/12/2021  ? Asymptomatic bacteriuria 02/12/2021  ? Nicotine dependence 02/12/2021  ? Abdominal pain 02/22/2020  ? Chronic constipation 02/22/2020  ? Nausea & vomiting 02/22/2020  ? SIRS (systemic inflammatory response syndrome) (HCC) 01/14/2020  ? Diabetic gastroparesis (HCC) 11/25/2019  ? Polysubstance abuse (HCC) 11/25/2019  ? Gastritis 11/25/2019  ? DKA, type 1 (HCC) 11/24/2019  ? Cyclical vomiting with nausea 10/01/2019  ? Intractable nausea and vomiting 09/30/2019  ? Cellulitis   ? DKA (diabetic  ketoacidoses) 01/27/2019  ? Leukocytosis 12/19/2018  ? Dehydration   ? Pseudoseizures (HCC)   ? Type 1 diabetes mellitus with ketoacidosis without coma (HCC) 11/28/2018  ? Hyperglycemia 11/27/2018  ? 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 MDD (major depressive disorder), recurrent episode, severe (HCC) 11/06/2018  ? Nonspecific abnormal electrocardiogram (ECG) (EKG)   ? Chest pain of uncertain etiology   ? Hypertensive urgency 10/08/2018  ? Conversion disorder with attacks or seizures, acute episode, with psychological stressor 09/16/2018  ? MDD (major depressive disorder), recurrent severe, without psychosis (HCC)   ? Hypokalemia 08/04/2018  ? AKI (acute kidney injury) (HCC) 08/04/2018  ? Seizure-like activity (HCC) 08/03/2018  ? Depression 07/25/2018  ? Syncope 01/30/2018  ? Orthostatic hypotension 01/24/2018  ? Tachycardia 12/28/2017  ? Chronic abdominal pain 12/24/2017  ? Chest pain 12/19/2017  ? Nausea and vomiting 08/21/2017  ? Generalized abdominal pain 08/21/2017  ? Non compliance with medical treatment 01/27/2012  ? Adjustment disorder 09/16/2011  ? Acanthosis nigricans   ? Goiter   ? Obesity 06/14/2010  ? Hypertension 06/14/2010  ? ? ? ?Physical Exam  ?Triage Vital Signs: ?ED Triage Vitals  ?Enc Vitals Group  ?   BP 04/09/21 1210 (!) 140/94  ?   Pulse Rate 04/09/21 1210 (!) 102  ?   Resp 04/09/21 1210 16  ?  Temp 04/09/21 1210 99.2 ?F (37.3 ?C)  ?   Temp Source 04/09/21 1210 Oral  ?   SpO2 04/09/21 1210 98 %  ?   Weight --   ?   Height --   ?   Head Circumference --   ?   Peak Flow --   ?   Pain Score 04/09/21 1208 5  ?   Pain Loc --   ?   Pain Edu? --   ?   Excl. in GC? --   ? ? ?Most recent vital signs: ?Vitals:  ? 04/09/21 1630 04/09/21 1737  ?BP: (!) 150/97 (!) 152/99  ?Pulse: (!) 109 (!) 102  ?Resp:  20  ?Temp:    ?SpO2: 99% 99%  ? ? ? ?General: Awake, no distress.  ?CV:  Good peripheral perfusion.  ?Resp:  Normal effort.  ?Abd:  No distention.  Soft and  nontender throughout ?Neuro:             Awake, Alert, Oriented x 3  ?Other:   ? ? ?ED Results / Procedures / Treatments  ?Labs ?(all labs ordered are listed, but only abnormal results are displayed) ?Labs Reviewed  ?BASIC METABOLIC PANEL - Abnormal; Notable for the following components:  ?    Result Value  ? Glucose, Bld 172 (*)   ? All other components within normal limits  ?CBC - Abnormal; Notable for the following components:  ? Hemoglobin 11.1 (*)   ? HCT 34.9 (*)   ? MCV 79.1 (*)   ? MCH 25.2 (*)   ? All other components within normal limits  ?HCG, QUANTITATIVE, PREGNANCY  ?CBG MONITORING, ED  ? ? ? ?EKG ? ?EKG interpreted by myself, sinus tachycardia normal axis normal intervals no acute schema changes ? ? ?RADIOLOGY ? ? ? ?PROCEDURES: ? ?Critical Care performed: No ? ?Procedures ? ?MEDICATIONS ORDERED IN ED: ?Medications  ?metoCLOPramide (REGLAN) tablet 10 mg (10 mg Oral Given 04/09/21 1701)  ?lamoTRIgine (LAMICTAL) tablet 25 mg (25 mg Oral Given 04/09/21 1701)  ? ? ? ?IMPRESSION / MDM / ASSESSMENT AND PLAN / ED COURSE  ?I reviewed the triage vital signs and the nursing notes. ?             ?               ? ?Differential diagnosis includes, but is not limited to, cyclic vomiting, gastroparesis, cannabinol hyperemesis ? ?Patient is a 21 year old female presents with nausea vomiting inability to keep her Lamictal down due to running out of her Reglan.  Typically takes the Reglan daily and without it has significant nausea and vomiting.  She has been out of it for the last 2 days.  Has had multiple seizures apparently due to the fact that she is unable to keep down the Lamictal.  She is feeling mildly lightheaded upon standing but has not had a syncopal episode denies chest pain or shortness of breath.  Vital signs noted for mild tachycardia, EKG without concerning findings.  Patient was given dose of Reglan in the ED and her Lamictal and will prescribe Reglan.  Labs overall reassuring she has no leukocytosis or  significant electrolyte abnormality and hCG is negative.  She is appropriate for discharge. ? ?  ? ? ?FINAL CLINICAL IMPRESSION(S) / ED DIAGNOSES  ? ?Final diagnoses:  ?Nausea and vomiting, unspecified vomiting type  ? ? ? ?Rx / DC Orders  ? ?ED Discharge Orders   ? ?  Ordered  ?  metoCLOPramide (REGLAN) 10 MG tablet  3 times daily with meals       ? 04/09/21 1719  ? ?  ?  ? ?  ? ? ? ?Note:  This document was prepared using Dragon voice recognition software and may include unintentional dictation errors. ?  ?Georga Hacking, MD ?04/10/21 0018 ? ?

## 2021-04-15 IMAGING — DX DG CHEST 2V
2 series · 2 of 2 positions shown · non-contrast
Comparison: 02/21/2020

CLINICAL DATA: Chest pain

EXAM:
CHEST - 2 VIEW

[chest lat]
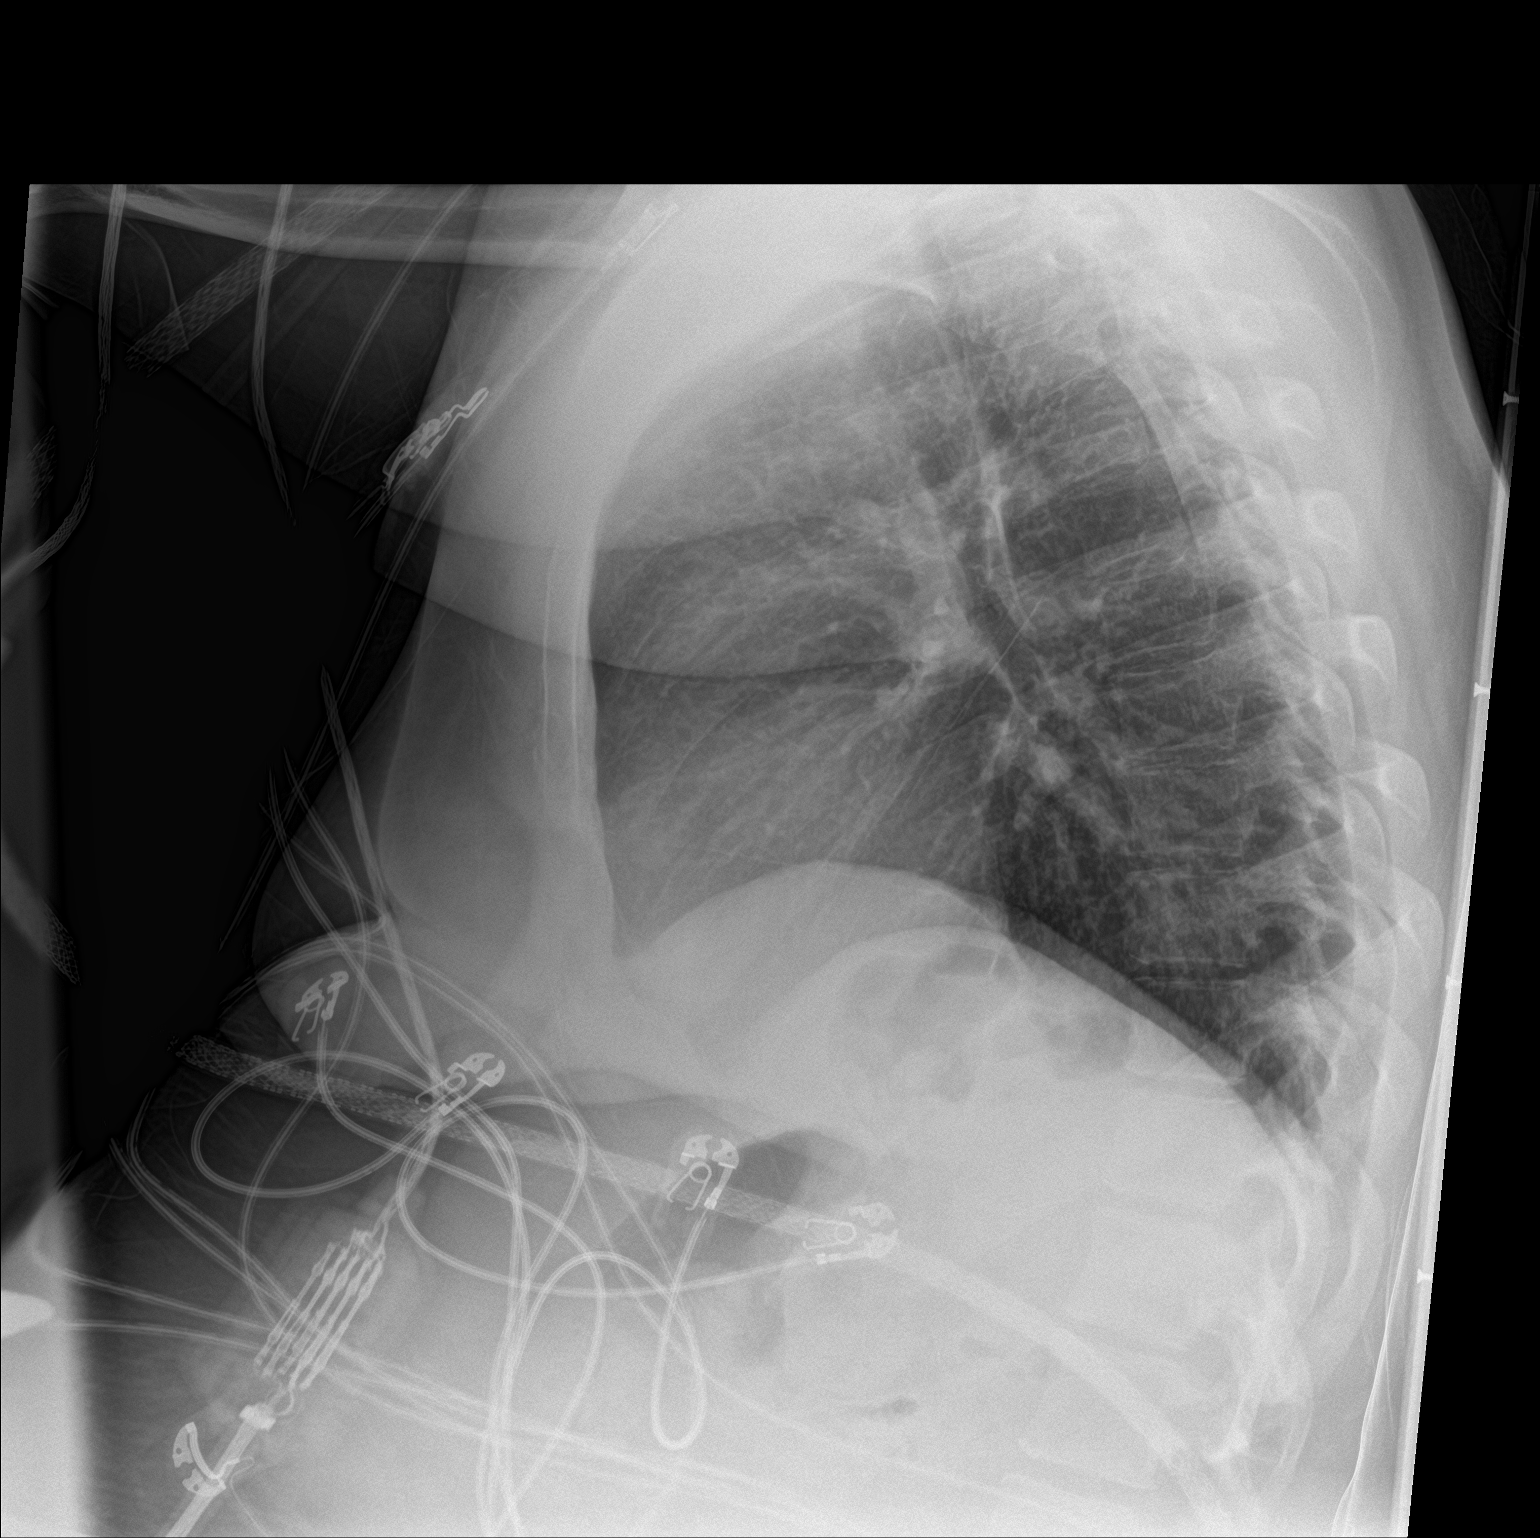

[chest ap]
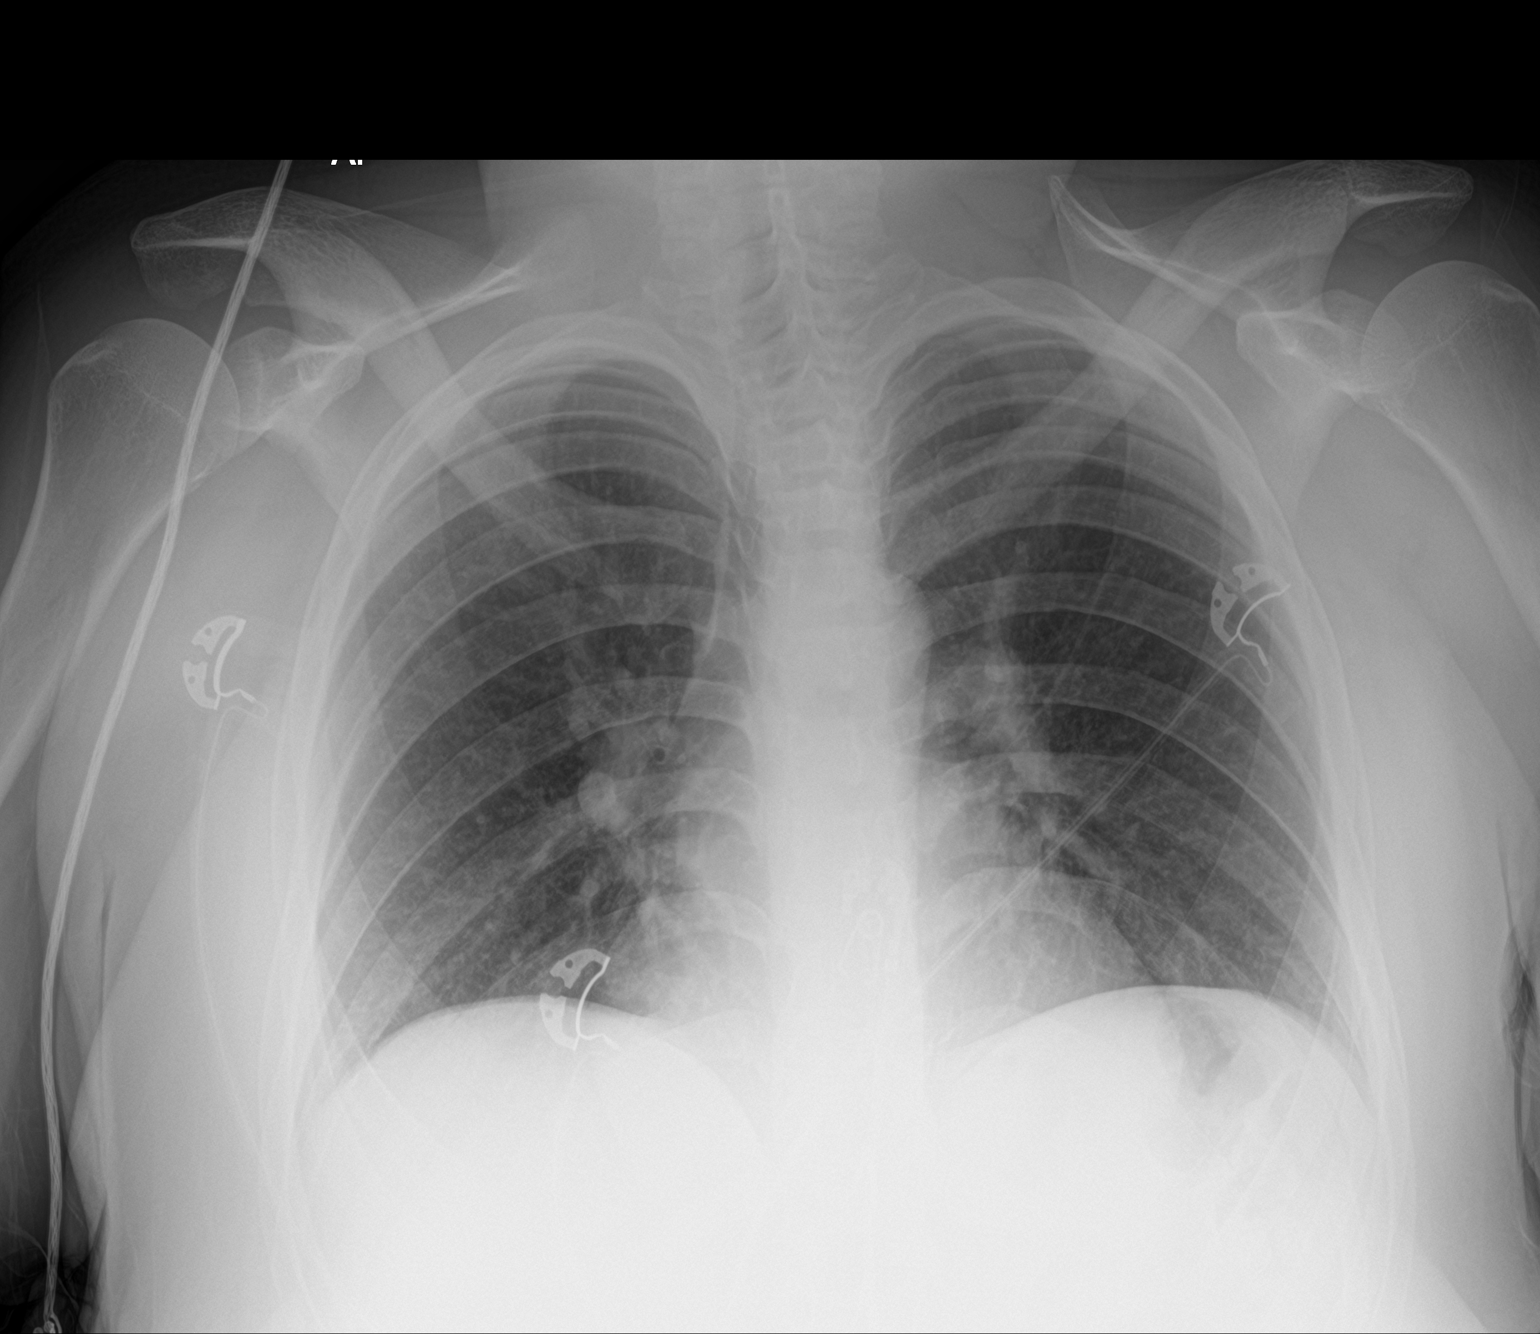

[2 of 2 positions shown; findings below may reference images not displayed]

FINDINGS: The heart size and mediastinal contours are within normal limits.
Both lungs are clear. The visualized skeletal structures are
unremarkable.
IMPRESSION: No active cardiopulmonary disease.

## 2021-05-06 ENCOUNTER — Other Ambulatory Visit: Payer: Self-pay

## 2021-05-06 ENCOUNTER — Emergency Department
Admission: EM | Admit: 2021-05-06 | Discharge: 2021-05-07 | Disposition: A | Discharge status: Home / Self Care | Payer: Medicaid Other | Attending: Emergency Medicine | Admitting: Emergency Medicine

## 2021-05-06 ENCOUNTER — Emergency Department: Payer: Medicaid Other

## 2021-05-06 DIAGNOSIS — E1043 Type 1 diabetes mellitus with diabetic autonomic (poly)neuropathy: Secondary | ICD-10-CM | POA: Insufficient documentation

## 2021-05-06 DIAGNOSIS — R112 Nausea with vomiting, unspecified: Secondary | ICD-10-CM | POA: Diagnosis not present

## 2021-05-06 DIAGNOSIS — R569 Unspecified convulsions: Secondary | ICD-10-CM | POA: Insufficient documentation

## 2021-05-06 DIAGNOSIS — R1084 Generalized abdominal pain: Secondary | ICD-10-CM | POA: Insufficient documentation

## 2021-05-06 DIAGNOSIS — F445 Conversion disorder with seizures or convulsions: Secondary | ICD-10-CM

## 2021-05-06 DIAGNOSIS — I509 Heart failure, unspecified: Secondary | ICD-10-CM | POA: Diagnosis not present

## 2021-05-06 LAB — HEPATIC FUNCTION PANEL
ALT: 19 U/L (ref 0–44)
AST: 26 U/L (ref 15–41)
Albumin: 4.1 g/dL (ref 3.5–5.0)
Alkaline Phosphatase: 70 U/L (ref 38–126)
Bilirubin, Direct: 0.1 mg/dL (ref 0.0–0.2)
Indirect Bilirubin: 0.6 mg/dL (ref 0.3–0.9)
Total Bilirubin: 0.7 mg/dL (ref 0.3–1.2)
Total Protein: 7.7 g/dL (ref 6.5–8.1)

## 2021-05-06 LAB — CBC WITH DIFFERENTIAL/PLATELET
Abs Immature Granulocytes: 0.02 10*3/uL (ref 0.00–0.07)
Basophils Absolute: 0 10*3/uL (ref 0.0–0.1)
Basophils Relative: 0 %
Eosinophils Absolute: 0 10*3/uL (ref 0.0–0.5)
Eosinophils Relative: 0 %
HCT: 36.4 % (ref 36.0–46.0)
Hemoglobin: 11.5 g/dL — ABNORMAL LOW (ref 12.0–15.0)
Immature Granulocytes: 0 %
Lymphocytes Relative: 21 %
Lymphs Abs: 1.6 10*3/uL (ref 0.7–4.0)
MCH: 24.4 pg — ABNORMAL LOW (ref 26.0–34.0)
MCHC: 31.6 g/dL (ref 30.0–36.0)
MCV: 77.3 fL — ABNORMAL LOW (ref 80.0–100.0)
Monocytes Absolute: 0.4 10*3/uL (ref 0.1–1.0)
Monocytes Relative: 5 %
Neutro Abs: 5.7 10*3/uL (ref 1.7–7.7)
Neutrophils Relative %: 74 %
Platelets: 327 10*3/uL (ref 150–400)
RBC: 4.71 MIL/uL (ref 3.87–5.11)
RDW: 14.6 % (ref 11.5–15.5)
WBC: 7.7 10*3/uL (ref 4.0–10.5)
nRBC: 0 % (ref 0.0–0.2)

## 2021-05-06 LAB — BASIC METABOLIC PANEL
Anion gap: 18 — ABNORMAL HIGH (ref 5–15)
BUN: 10 mg/dL (ref 6–20)
CO2: 22 mmol/L (ref 22–32)
Calcium: 9.3 mg/dL (ref 8.9–10.3)
Chloride: 100 mmol/L (ref 98–111)
Creatinine, Ser: 0.49 mg/dL (ref 0.44–1.00)
GFR, Estimated: >60 mL/min (ref >60)
Glucose, Bld: 176 mg/dL — ABNORMAL HIGH (ref 70–99)
Potassium: 3.4 mmol/L — ABNORMAL LOW (ref 3.5–5.1)
Sodium: 140 mmol/L (ref 135–145)

## 2021-05-06 MED ORDER — METOCLOPRAMIDE HCL 5 MG/ML IJ SOLN
10.0000 mg | Freq: Once | INTRAMUSCULAR | Status: AC
  Administered 2021-05-06: 10 mg via INTRAVENOUS
  Filled 2021-05-06: qty 2

## 2021-05-06 MED ORDER — PROCHLORPERAZINE EDISYLATE 10 MG/2ML IJ SOLN
10.0000 mg | INTRAMUSCULAR | Status: AC
  Administered 2021-05-06: 10 mg via INTRAVENOUS
  Filled 2021-05-06: qty 2

## 2021-05-06 MED ORDER — MORPHINE SULFATE (PF) 4 MG/ML IV SOLN: 4.0000 mg | Freq: Once | INTRAVENOUS | Status: DC

## 2021-05-06 MED ORDER — SODIUM CHLORIDE 0.9 % IV BOLUS
1000.0000 mL | Freq: Once | INTRAVENOUS | Status: AC
  Administered 2021-05-06: 1000 mL via INTRAVENOUS

## 2021-05-06 MED ORDER — ONDANSETRON 4 MG PO TBDP
4.0000 mg | ORAL_TABLET | Freq: Once | ORAL | Status: AC
  Administered 2021-05-06: 4 mg via ORAL
  Filled 2021-05-06: qty 1

## 2021-05-06 NOTE — ED Triage Notes (Signed)
First Nurse Note:  ?Pt via EMS from home. Per mom, pt had a seizure and syncopal episode. Per EMS, during these seizure pt is alert and responsive but start convulsing. Pt had 2 of these seizures with EMS, no post ictal phase. Pt has hx of seizure but takes Lamictal. Pt also c/o generalized abd pain. Pt is A&Ox4 and NAD ? ?175 CBG ?150/113 ?96% on RA ?107 HR ?

## 2021-05-06 NOTE — ED Provider Notes (Signed)
? ?[facility] ?Provider Note ? ? ? Event Date/Time  ? First MD Initiated Contact with Patient 05/06/21 2022   ?  (approximate) ? ? ?History  ? ?Seizures ? ? ?HPI ? ?Nancy [name] is a 21 y.o. female who on review of discharge summary from February 8 of this year medical history significant of acanthosis nigricans, anxiety, depression, unspecified CHF, type I DM, history of type I DKA, dyspepsia, obesity, ovarian cyst, precocious adrenarche, history of premature delivery, history of pseudoseizures  ? ?Patient here for evaluation after having episodes of vomiting abdominal pain. ?Patient also reports she has been having seizure-like episodes.  She reports that she has regular seizure-like events, has been diagnosed as seizure-like episodes that occur often under stress ? ?She also reports that she has had multiple times of worse again abdominal pain and vomiting, she is seen physicians and is been evaluated for gastroparesis in the past.  Today she normally takes Reglan but was not able to do so. ? ?She denies any headache no chest pain no trouble breathing.  Reports pain all over her abdomen with intractable nausea and feeling like she wants to vomit.  She has she is not able to take anything by mouth. ? ?No fever. ? ? ?Physical Exam  ? ?Triage Vital Signs: ?ED Triage Vitals  ?Enc Vitals Group  ?   BP 05/06/21 1707 (!) 163/107  ?   Pulse Rate 05/06/21 1707 84  ?   Resp 05/06/21 1707 20  ?   Temp 05/06/21 1707 98.7 ?F (37.1 ?C)  ?   Temp Source 05/06/21 1707 Oral  ?   SpO2 05/06/21 1707 97 %  ?   Weight 05/06/21 1707 190 lb (86.2 kg)  ?   Height 05/06/21 1707 5\' 2"  (1.575 m)  ?   Head Circumference --   ?   Peak Flow --   ?   Pain Score 05/06/21 1716 8  ?   Pain Loc --   ?   Pain Edu? --   ?   Excl. in [place]? --   ? ? ?Most recent vital signs: ?Vitals:  ? 05/06/21 1707 05/06/21 2121  ?BP: (!) 163/107 (!) 160/108  ?Pulse: 84 (!) 108  ?Resp: 20 19  ?Temp: 98.7 ?F (37.1 ?C)   ?SpO2: 97% 100%   ? ? ? ?General: Awake, no distress.  Patient is noted to be laying on her side in a somewhat fetal position.  She has tremulousness in both of her arms and legs but is fully alert conversant and tells me that this is the "seizures" that she will experience.  At the present time her concern is abdominal pain she reports is all over and extremely nauseating ?CV:  Good peripheral perfusion.  Normal rhythm ?Resp:  Normal effort.   ?Abd:  No distention.  Patient is somewhat obese.  Reports abdominal pain throughout on exam, no obvious focality to noted.  Patient had difficulty rolling onto her back for focal abdominal exam reporting pain and nausea causing her to lay on the left lateral position ?Other:   ?Fully alert.  Normal facial expressions.  Moves all extremities without difficulty.  Normal alertness.  No seizure-like convulsions.  The patient seems tremulous in arms and legs but is fully alert and oriented ? ?ED Results / Procedures / Treatments  ? ?Labs ?(all labs ordered are listed, but only abnormal results are displayed) ?Labs Reviewed  ?CBC WITH DIFFERENTIAL/PLATELET - Abnormal; Notable for the following components:  ?  Result Value  ? Hemoglobin 11.5 (*)   ? MCV 77.3 (*)   ? MCH 24.4 (*)   ? All other components within normal limits  ?BASIC METABOLIC PANEL - Abnormal; Notable for the following components:  ? Potassium 3.4 (*)   ? Glucose, Bld 176 (*)   ? Anion gap 18 (*)   ? All other components within normal limits  ?HEPATIC FUNCTION PANEL  ?LAMOTRIGINE LEVEL  ?URINALYSIS, ROUTINE W REFLEX MICROSCOPIC  ?URINE DRUG SCREEN, QUALITATIVE ([facility] ONLY)  ?HCG, QUANTITATIVE, PREGNANCY  ?LIPASE, BLOOD  ?POC URINE PREG, ED  ? ? ? ?EKG ? ?Reviewed and by me at 1715 ?Heart rate 110 ?QRS 99 QTc 465 ?Sinus tachycardia, no acute cardiac abnormalities denoted. ? ? ?RADIOLOGY ?CT abdomen pelvis pending at time of signout Dr. York Cerise to follow-up ? ?CT of the head reviewed negative for acute  finding ? ? ? ?PROCEDURES: ? ?Critical Care performed: No ? ?Procedures ? ? ?MEDICATIONS ORDERED IN ED: ?Medications  ?morphine (PF) 4 MG/ML injection 4 mg (4 mg Intravenous Not Given 05/06/21 2200)  ?ondansetron (ZOFRAN-ODT) disintegrating tablet 4 mg (4 mg Oral Given 05/06/21 1729)  ?metoCLOPramide (REGLAN) injection 10 mg (10 mg Intravenous Given 05/06/21 2104)  ?sodium chloride 0.9 % bolus 1,000 mL (0 mLs Intravenous Stopped 05/06/21 2213)  ?prochlorperazine (COMPAZINE) injection 10 mg (10 mg Intravenous Given 05/06/21 2229)  ? ? ? ?IMPRESSION / MDM / ASSESSMENT AND PLAN / ED COURSE  ?I reviewed the triage vital signs and the nursing notes. ?             ?               ? ?Patient reportedly had seizure-like activity in syncopal episode, however patient reports that her seizure-like shaking is not unusual and clinical evaluation and review of note seems to indicate pseudoseizures without clear evidence of epilepsy.  Her exam today is not consistent with seizure but rather she does exhibit tremulousness in her arms and legs and I suspect this is likely secondary to stress or stress reaction.  CT of the head is negative, her exam is without evidence of acute neurologic deficit.  Also reports possible syncopal-like episode, but no associated palpitations or chest pain.  No evidence of injury or fall.  ?The patient is on the cardiac monitor to evaluate for evidence of arrhythmia and/or significant heart rate changes. ? ?Labs reviewed notable for normal white count.  Very mild anemia.  Normal basic metabolic panel except for minimally elevated anion gap.  Normal hepatic function ? ?Clinical Course as of 05/07/21 0032  ?Mon May 06, 2021  ?2034 Patient reports that symptoms she is having occur frequently.  Usually alleviated by Reglan or Phenergan.  She prefers Phenergan, but noted that pharmacy is flagging Phenergan and is on national backorder and to only use if critically needed.  I will order Reglan which patient also  reports helps with symptoms [MQ]  ?2145 Patient reports abdominal pain, intractable nausea.  No active vomiting noted.  She has had multiple antiemetics at this time, and I have ordered Compazine and morphine.  Given the intractable nature we will also order imaging to evaluate for acute abdominal pathology. [MQ]  ?2145 Patient does report a history of recurrent episodes of unexplained nausea vomiting and abdominal pain similar nature.  My pretest probability for acute intra-abdominal pathology is somewhat low but given the unremitting nature I think it would be prudent to exclude acute intra-abdominal etiology.  Denies pregnancy.  Pregnancy test -3 weeks ago as well [MQ]  ?  ?Clinical Course User Index ?[MQ] Sharyn Creamer, MD  ? ?----------------------------------------- ?12:30 AM on 05/07/2021 ?----------------------------------------- ?Ongoing care assigned to Dr. York Cerise.  Currently awaiting urine pregnancy test and pending CT abdomen pelvis to evaluate for obstructive or acute intra-abdominal pathology given the degree of intractable nausea which the patient is reporting.  Also has tremulousness, but no evidence by exam today that would support clear epileptic like activity, suspect underlying pseudoseizures which she has presented with in the past in similar situations ? ?If patient continues to have intractable nausea would consider admission for further care and management.  If patient's nausea improves symptoms improved and CT imaging reassuring, would consider outpatient treatment and follow-up ? ?FINAL CLINICAL IMPRESSION(S) / ED DIAGNOSES  ? ?Final diagnoses:  ?Intractable nausea and vomiting  ?Psychiatric pseudoseizure  ? ? ? ?Rx / DC Orders  ? ?ED Discharge Orders   ? ? None  ? ?  ? ? ? ?Note:  This document was prepared using Dragon voice recognition software and may include unintentional dictation errors. ?  Sharyn Creamer, MD ?05/07/21 0032 ? ?

## 2021-05-06 NOTE — ED Notes (Signed)
Visitor at the desk, states that this pt was passed out onto the floor. This RN and Steph, RN at pt's side. Pt not responding but pulses present. Sternal rubbed and pt arousable, pt now moaning and groaning at this time. Unknown if pt hit her head. After couple mins pt is back at baseline asked where her purse and belongings were. Pt transported to CT and will place pt in a recliner for better safety. Pt is A&Ox4 at this time.  ?

## 2021-05-06 NOTE — ED Notes (Signed)
Pt is dry heaving in the lobby, offered pt a Zofran at this time.  ?

## 2021-05-06 NOTE — ED Provider Triage Note (Signed)
Emergency Medicine Provider Triage Evaluation Note ? ?Nancy [name] , a 21 y.o. female  was evaluated in triage.  Pt complains of seizure-like activity.  Patient takes Lamictal for seizures. ? ?Review of Systems  ?Positive: Seizure-like activity ?Negative: Fever, chills ? ?Physical Exam  ?BP (!) 163/107 (BP Location: Left Arm)   Pulse 84   Temp 98.7 ?F (37.1 ?C) (Oral)   Resp 20   Ht 5\' 2"  (1.575 m)   Wt 86.2 kg   SpO2 97%   BMI 34.75 kg/m?  ?Gen:   Awake, no distress   ?Resp:  Normal effort  ?MSK:   Moves extremities without difficulty  ?Other:   ? ?Medical Decision Making  ?Medically screening exam initiated at 5:16 PM.  Appropriate orders placed.  was informed that the remainder of the evaluation will be completed by another provider, this initial triage assessment does not replace that evaluation, and the importance of remaining in the ED until their evaluation is complete. ? ?Seizure protocol started by nursing staff.  Added Lamictal level ?  ?Nancy Paradise, PA-C ?05/06/21 1716 ? ?

## 2021-05-07 ENCOUNTER — Emergency Department: Payer: Medicaid Other

## 2021-05-07 LAB — LIPASE, BLOOD: Lipase: 29 U/L (ref 11–51)

## 2021-05-07 LAB — HCG, QUANTITATIVE, PREGNANCY: hCG, Beta Chain, Quant, S: <1 m[IU]/mL (ref <5)

## 2021-05-07 MED ORDER — METOCLOPRAMIDE HCL 10 MG PO TABS: 10.0000 mg | ORAL_TABLET | Freq: Three times a day (TID) | ORAL | 1 refills | Status: DC

## 2021-05-07 NOTE — ED Provider Notes (Addendum)
----------------------------------------- ?  12:02 AM on 05/07/2021 ?----------------------------------------- ? ?Assuming care from Dr. Fanny Bien.  In short, Nancy [name] is a 21 y.o. female with a chief complaint of intractable N/V.  Refer to the original H&P for additional details. ? ?The current plan of care is to follow up CT scan and reassess whether or not symptoms have improved. ? ? ?Clinical Course as of 05/07/21 0112  ?Tue May 07, 2021  ?0103 Patient woke up and said she feels fine and wants to go home.  She says she has been here long enough.  I reassessed her and she is lying on her stomach in no apparent discomfort.  She said that she has no more pain or nausea.  No indication for any additional testing or medication.  I canceled the CT scan given the complete resolution of her symptoms.  I also asked her if she has had similar symptoms before, and she confirmed that she has.  I asked her if she wanted a prescription for nausea medicine and she requested Reglan so I wrote that prescription.  I gave my usual and customary return precautions. [CF]  ?0111 Of note, I reviewed the results of her beta-hCG and it is negative for pregnancy. [CF]  ?  ?Clinical Course User Index ?[CF] Loleta [name], MD ?[MQ] Sharyn Creamer, MD  ? ? ?Final diagnoses:  ?Psychiatric pseudoseizure  ?Nausea and vomiting, unspecified vomiting type  ? ? ?  ?Loleta [name], MD ?05/07/21 0112 ? ?  ?Loleta [name], MD ?05/07/21 6962 ? ?

## 2021-05-07 NOTE — Discharge Instructions (Signed)
You have been seen in the Emergency Department (ED) today for nausea and vomiting.  Your work up today has not shown a clear cause for your symptoms. °You have been prescribed Reglan; please use as prescribed as needed for your nausea. ° °Follow up with your doctor as soon as possible regarding today?s emergent visit and your symptoms of nausea.  ° °Return to the ED if you develop abdominal, bloody vomiting, bloody diarrhea, if you are unable to tolerate fluids due to vomiting, or if you develop other symptoms that concern you. ° °

## 2021-05-08 LAB — LAMOTRIGINE LEVEL: Lamotrigine Lvl: <1 ug/mL — ABNORMAL LOW (ref 2.0–20.0)

## 2021-05-09 ENCOUNTER — Emergency Department
Admission: EM | Admit: 2021-05-09 | Discharge: 2021-05-09 | Disposition: A | Discharge status: Home / Self Care | Payer: Medicaid Other | Attending: Emergency Medicine | Admitting: Emergency Medicine

## 2021-05-09 ENCOUNTER — Encounter: Payer: Self-pay | Admitting: Emergency Medicine

## 2021-05-09 ENCOUNTER — Other Ambulatory Visit: Payer: Self-pay

## 2021-05-09 DIAGNOSIS — R1110 Vomiting, unspecified: Secondary | ICD-10-CM | POA: Diagnosis not present

## 2021-05-09 DIAGNOSIS — F12188 Cannabis abuse with other cannabis-induced disorder: Secondary | ICD-10-CM | POA: Diagnosis not present

## 2021-05-09 DIAGNOSIS — R112 Nausea with vomiting, unspecified: Secondary | ICD-10-CM | POA: Diagnosis present

## 2021-05-09 LAB — CBC
HCT: 36.1 % (ref 36.0–46.0)
Hemoglobin: 11.6 g/dL — ABNORMAL LOW (ref 12.0–15.0)
MCH: 24.6 pg — ABNORMAL LOW (ref 26.0–34.0)
MCHC: 32.1 g/dL (ref 30.0–36.0)
MCV: 76.6 fL — ABNORMAL LOW (ref 80.0–100.0)
Platelets: 334 10*3/uL (ref 150–400)
RBC: 4.71 MIL/uL (ref 3.87–5.11)
RDW: 14.7 % (ref 11.5–15.5)
WBC: 8.1 10*3/uL (ref 4.0–10.5)
nRBC: 0 % (ref 0.0–0.2)

## 2021-05-09 LAB — COMPREHENSIVE METABOLIC PANEL
ALT: 17 U/L (ref 0–44)
AST: 20 U/L (ref 15–41)
Albumin: 4.1 g/dL (ref 3.5–5.0)
Alkaline Phosphatase: 68 U/L (ref 38–126)
Anion gap: 10 (ref 5–15)
BUN: 11 mg/dL (ref 6–20)
CO2: 28 mmol/L (ref 22–32)
Calcium: 9.2 mg/dL (ref 8.9–10.3)
Chloride: 100 mmol/L (ref 98–111)
Creatinine, Ser: 0.5 mg/dL (ref 0.44–1.00)
GFR, Estimated: >60 mL/min (ref >60)
Glucose, Bld: 171 mg/dL — ABNORMAL HIGH (ref 70–99)
Potassium: 3.3 mmol/L — ABNORMAL LOW (ref 3.5–5.1)
Sodium: 138 mmol/L (ref 135–145)
Total Bilirubin: 1 mg/dL (ref 0.3–1.2)
Total Protein: 7.4 g/dL (ref 6.5–8.1)

## 2021-05-09 LAB — URINALYSIS, ROUTINE W REFLEX MICROSCOPIC
Bilirubin Urine: NEGATIVE
Glucose, UA: 150 mg/dL — AB
Hgb urine dipstick: NEGATIVE
Ketones, ur: 5 mg/dL — AB
Leukocytes,Ua: NEGATIVE
Nitrite: NEGATIVE
Protein, ur: 30 mg/dL — AB
Specific Gravity, Urine: 1.017 (ref 1.005–1.030)
pH: 6 (ref 5.0–8.0)

## 2021-05-09 LAB — URINE DRUG SCREEN, QUALITATIVE (ARMC ONLY)
Amphetamines, Ur Screen: NOT DETECTED
Barbiturates, Ur Screen: NOT DETECTED
Benzodiazepine, Ur Scrn: NOT DETECTED
Cannabinoid 50 Ng, Ur ~~LOC~~: POSITIVE — AB
Cocaine Metabolite,Ur ~~LOC~~: NOT DETECTED
MDMA (Ecstasy)Ur Screen: NOT DETECTED
Methadone Scn, Ur: NOT DETECTED
Opiate, Ur Screen: NOT DETECTED
Phencyclidine (PCP) Ur S: NOT DETECTED
Tricyclic, Ur Screen: NOT DETECTED

## 2021-05-09 LAB — LIPASE, BLOOD: Lipase: 30 U/L (ref 11–51)

## 2021-05-09 LAB — POC URINE PREG, ED: Preg Test, Ur: NEGATIVE

## 2021-05-09 MED ORDER — DROPERIDOL 2.5 MG/ML IJ SOLN
2.5000 mg | Freq: Once | INTRAMUSCULAR | Status: AC
  Administered 2021-05-09: 2.5 mg via INTRAVENOUS
  Filled 2021-05-09: qty 2

## 2021-05-09 MED ORDER — SODIUM CHLORIDE 0.9 % IV BOLUS
1000.0000 mL | Freq: Once | INTRAVENOUS | Status: AC
  Administered 2021-05-09: 1000 mL via INTRAVENOUS

## 2021-05-09 MED ORDER — POTASSIUM CHLORIDE IN NACL 20-0.9 MEQ/L-% IV SOLN
Freq: Once | INTRAVENOUS | Status: AC
  Filled 2021-05-09: qty 1000

## 2021-05-09 NOTE — ED Triage Notes (Signed)
Pt arrived via [facility] from home where she reported having c/o abdominal pain that have caused syncope and seizures since 5/1. Pt seen for same on 5/1. Pt A&O x4, denies hitting head.  ?

## 2021-05-09 NOTE — ED Notes (Signed)
D/C and f/up discussed with pt, pt verbalized understanding. NAD noted. Pt ambulatory on D/C.  ? ?Pt given work note per request.  ?

## 2021-05-09 NOTE — ED Notes (Signed)
Pt educated on need for UA, pt verbalized understanding and will call when she has to go. Call bell in reach.  ?

## 2021-05-09 NOTE — ED Provider Notes (Signed)
? ?[facility] ?Provider Note ? ? Event Date/Time  ? First MD Initiated Contact with Patient 05/09/21 [phone]   ?  (approximate) ?History  ?Abdominal Pain ? ?HPI ?Nancy [name] is a 21 y.o. female with a past medical history of cannabis abuse and gastroparesis who presents for nausea/vomiting for the second time in 5 days.  Patient states that she is having 10/10 generalized burning abdominal pain that has caused her to pass out as well as have seizures.  Patient denies any head trauma.  Patient states that she cannot count how many times she has passed out.  Patient currently denies any vision changes, tinnitus, difficulty speaking, facial droop, sore throat, chest pain, shortness of breath, diarrhea, dysuria, or weakness/numbness/paresthesias in any extremity ?Physical Exam  ?Triage Vital Signs: ?ED Triage Vitals  ?Enc Vitals Group  ?   BP 05/09/21 0610 (!) 180/100  ?   Pulse Rate 05/09/21 0610 99  ?   Resp 05/09/21 0610 20  ?   Temp 05/09/21 0610 98.6 ?F (37 ?C)  ?   Temp Source 05/09/21 0610 Oral  ?   SpO2 05/09/21 0610 100 %  ?   Weight --   ?   Height --   ?   Head Circumference --   ?   Peak Flow --   ?   Pain Score 05/09/21 1317 4  ?   Pain Loc --   ?   Pain Edu? --   ?   Excl. in [place]? --   ? ?Most recent vital signs: ?Vitals:  ? 05/09/21 1100 05/09/21 1300  ?BP: 133/88 (!) 137/94  ?Pulse: (!) 107 (!) 108  ?Resp: 15 20  ?Temp:    ?SpO2: 100% 100%  ? ?General: Awake, oriented x4. ?CV:  Good peripheral perfusion.  ?Resp:  Normal effort.  ?Abd:  No distention.  ?Other:  Obese young adult African-American female laying in bed in no distress ?ED Results / Procedures / Treatments  ?Labs ?(all labs ordered are listed, but only abnormal results are displayed) ?Labs Reviewed  ?COMPREHENSIVE METABOLIC PANEL - Abnormal; Notable for the following components:  ?    Result Value  ? Potassium 3.3 (*)   ? Glucose, Bld 171 (*)   ? All other components within normal limits  ?CBC - Abnormal; Notable for  the following components:  ? Hemoglobin 11.6 (*)   ? MCV 76.6 (*)   ? MCH 24.6 (*)   ? All other components within normal limits  ?URINALYSIS, ROUTINE W REFLEX MICROSCOPIC - Abnormal; Notable for the following components:  ? Color, Urine YELLOW (*)   ? APPearance HAZY (*)   ? Glucose, UA 150 (*)   ? Ketones, ur 5 (*)   ? Protein, ur 30 (*)   ? Bacteria, UA RARE (*)   ? All other components within normal limits  ?URINE DRUG SCREEN, QUALITATIVE ([facility] ONLY) - Abnormal; Notable for the following components:  ? Cannabinoid 50 Ng, Ur [place] POSITIVE (*)   ? All other components within normal limits  ?LIPASE, BLOOD  ?POC URINE PREG, ED  ? ?EKG ?ED ECG REPORT ?I, Naaman Plummer, the attending physician, personally viewed and interpreted this ECG. ?Date: 05/09/2021 ?EKG Time: [id number] ?Rate: 104 ?Rhythm: Tachycardic sinus rhythm ?QRS Axis: normal ?Intervals: normal ?ST/T Wave abnormalities: normal ?Narrative Interpretation: Tachycardic sinus rhythm.  No evidence of acute ischemia ?PROCEDURES: ?Critical Care performed: No ?.1-3 Lead EKG Interpretation ?Performed by: Naaman Plummer, MD ?Authorized by: Cheri Fowler,  Vista Lawman, MD  ? ?  Interpretation: normal   ?  ECG rate:  95 ?  ECG rate assessment: normal   ?  Rhythm: sinus rhythm   ?  Ectopy: none   ?  Conduction: normal   ?MEDICATIONS ORDERED IN ED: ?Medications  ?droperidol (INAPSINE) 2.5 MG/ML injection 2.5 mg (2.5 mg Intravenous Given 05/09/21 0725)  ?sodium chloride 0.9 % bolus 1,000 mL (0 mLs Intravenous Stopped 05/09/21 1324)  ?0.9 % NaCl with KCl 20 mEq/ L  infusion (0 mL/hr Intravenous Stopped 05/09/21 1324)  ? ?IMPRESSION / MDM / ASSESSMENT AND PLAN / ED COURSE  ?I reviewed the triage vital signs and the nursing notes. ?             ?               ? ?Patient presents for acute nausea/vomiting ?The cause of the patient?s symptoms is not clear, but the patient is overall well appearing and is suspected to have a transient course of illness. ? ?Given History and Exam there does not  appear to be an emergent cause of the symptoms such as small bowel obstruction, coronary syndrome, bowel ischemia, DKA, pancreatitis, appendicitis, other acute abdomen or other emergent problem. ? ?Reassessment: After treatment, the patient is feeling much better, tolerating PO fluids, and shows no signs of dehydration.  Patient asking to leave that she has a neurology appointment. ? ?Disposition: Discharge home with prompt primary care physician follow up in the next 48 hours. Strict return precautions discussed. ? ?  ?FINAL CLINICAL IMPRESSION(S) / ED DIAGNOSES  ? ?Final diagnoses:  ?Nausea and vomiting, unspecified vomiting type  ?Cannabinoid hyperemesis syndrome  ? ?Rx / DC Orders  ? ?ED Discharge Orders   ? ? None  ? ?  ? ?Note:  This document was prepared using Dragon voice recognition software and may include unintentional dictation errors. ?  ?Naaman Plummer, MD ?05/09/21 1641 ? ?

## 2021-05-09 NOTE — ED Notes (Addendum)
Staff in [facility] bathroom heard pt fall and then seen pt on floor, laying on back. Writer called and arrived. Pt A&O x4, c/o head pain. No bleeding noted at this time. C-Collar applied, Designer, fashion/clothing used backboard to pick pt up and place onto stretcher. Pt taken to room 1 for further evaluation.  ?

## 2021-05-09 NOTE — ED Notes (Signed)
Pt states she was seen here Tuesday for the same thing she is here for today. Pt is A&Ox4. NAD noted. VSS.  ? ? ?

## 2021-05-15 ENCOUNTER — Ambulatory Visit
Admission: EM | Admit: 2021-05-15 | Discharge: 2021-05-15 | Disposition: A | Discharge status: Home / Self Care | Payer: Medicaid Other | Attending: Emergency Medicine | Admitting: Emergency Medicine

## 2021-05-15 DIAGNOSIS — R112 Nausea with vomiting, unspecified: Secondary | ICD-10-CM | POA: Diagnosis not present

## 2021-05-15 DIAGNOSIS — R739 Hyperglycemia, unspecified: Secondary | ICD-10-CM

## 2021-05-15 DIAGNOSIS — R000 Tachycardia, unspecified: Secondary | ICD-10-CM

## 2021-05-15 DIAGNOSIS — E1065 Type 1 diabetes mellitus with hyperglycemia: Secondary | ICD-10-CM

## 2021-05-15 DIAGNOSIS — R197 Diarrhea, unspecified: Secondary | ICD-10-CM | POA: Diagnosis not present

## 2021-05-15 LAB — POCT FASTING CBG KUC MANUAL ENTRY: POCT Glucose (KUC): 197 mg/dL — AB (ref 70–99)

## 2021-05-15 NOTE — ED Notes (Signed)
Patient is being discharged from the Urgent Care and sent to the Emergency Department via personal vehicle . Per Arlana Pouch, NP, patient is in need of higher level of care due to uncontrolled diarrhea and diabetes. Patient IS aware and verbalizes understanding of plan of care.  ?Vitals:  ? 05/15/21 1742  ?BP: 108/70  ?Pulse: (!) 123  ?Resp: 18  ?Temp: 98.2 ?F (36.8 ?C)  ?SpO2: 97%  ?  ?

## 2021-05-15 NOTE — ED Triage Notes (Signed)
Patient presents to Urgent Care with complaints of n/v and diarrhea since 3 days ago. She states she is concerned with food poisoning. Treating symptoms with Reglan. Last BG 174 taken at 1645.  ? ?Denies fever.  ?

## 2021-05-15 NOTE — ED Provider Notes (Signed)
?[facility] ? ? ? ?CSN: [id number] ?Arrival date & time: 05/15/21  1728 ? ? ?  ? ?History   ?Chief Complaint ?Chief Complaint  ?Patient presents with  ? Emesis  ? Nausea  ? Diarrhea  ? ? ?HPI ?Nancy [name] is a 21 y.o. female.  Patient presents with 3-day history of nausea, vomiting, diarrhea.  She reports 6-7 episodes of diarrhea and 2 episodes of vomiting today.  She has been unable to eat foods but is able to drink Gatorade and soda.  She attributes her symptoms to eating possible undercooked chicken.  She has been treating her symptoms with Reglan.  No fever, chills, abdominal pain, dysuria, or other symptoms.  Patient has been seen in the emergency department multiple times in the past few months for similar symptoms.  She was last seen at [place] ED on 05/09/2021; diagnosed with nausea and vomiting, cannabinoid hyperemesis syndrome.  Her medical history includes uncontrolled diabetes, DKA, hypertension, congestive heart failure, seizures, chronic low back pain, anxiety, obesity. ? ?The history is provided by the patient and medical records.  ? ?Past Medical History:  ?Diagnosis Date  ? Acanthosis nigricans   ? Anxiety   ? CHF (congestive heart failure) (HCC)   ? Chronic lower back pain   ? Depression   ? DKA, type 1 (HCC) 09/13/2018  ? Dyspepsia   ? Obesity   ? Ovarian cyst   ? pt is not aware of this hx (11/24/2017)  ? Precocious adrenarche (HCC)   ? Premature baby   ? Seizures (HCC)   ? Type II diabetes mellitus (HCC)   ? insulin dependant  ? ? ?Patient Active Problem List  ? Diagnosis Date Noted  ? Microcytic anemia 02/12/2021  ? Uncontrolled type 1 diabetes mellitus with hyperglycemia, with long-term current use of insulin (HCC) 02/12/2021  ? Left inguinal abscess 02/12/2021  ? Asymptomatic bacteriuria 02/12/2021  ? Nicotine dependence 02/12/2021  ? Abdominal pain 02/22/2020  ? Chronic constipation 02/22/2020  ? Nausea & vomiting 02/22/2020  ? SIRS (systemic inflammatory response syndrome) (HCC)  01/14/2020  ? Diabetic gastroparesis (HCC) 11/25/2019  ? Polysubstance abuse (HCC) 11/25/2019  ? Gastritis 11/25/2019  ? DKA, type 1 (HCC) 11/24/2019  ? Cyclical vomiting with nausea 10/01/2019  ? Intractable nausea and vomiting 09/30/2019  ? Cellulitis   ? DKA (diabetic ketoacidoses) 01/27/2019  ? Leukocytosis 12/19/2018  ? Dehydration   ? Pseudoseizures (HCC)   ? Type 1 diabetes mellitus with ketoacidosis without coma (HCC) 11/28/2018  ? Hyperglycemia 11/27/2018  ? 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 MDD (major depressive disorder), recurrent episode, severe (HCC) 11/06/2018  ? Nonspecific abnormal electrocardiogram (ECG) (EKG)   ? Chest pain of uncertain etiology   ? Hypertensive urgency 10/08/2018  ? Conversion disorder with attacks or seizures, acute episode, with psychological stressor 09/16/2018  ? MDD (major depressive disorder), recurrent severe, without psychosis (HCC)   ? Hypokalemia 08/04/2018  ? AKI (acute kidney injury) (HCC) 08/04/2018  ? Seizure-like activity (HCC) 08/03/2018  ? Depression 07/25/2018  ? Syncope 01/30/2018  ? Orthostatic hypotension 01/24/2018  ? Tachycardia 12/28/2017  ? Chronic abdominal pain 12/24/2017  ? Chest pain 12/19/2017  ? Nausea and vomiting 08/21/2017  ? Generalized abdominal pain 08/21/2017  ? Non compliance with medical treatment 01/27/2012  ? Adjustment disorder 09/16/2011  ? Acanthosis nigricans   ? Goiter   ? Obesity 06/14/2010  ? Hypertension 06/14/2010  ? ? ?Past Surgical History:  ?Procedure Laterality Date  ?  ABDOMINAL HERNIA REPAIR    ? "I was a baby"  ? BIOPSY  10/12/2018  ? Procedure: BIOPSY;  Surgeon: Lynann Bologna, MD;  Location: [facility] ENDOSCOPY;  Service: Endoscopy;;  ? BIOPSY  02/28/2020  ? Procedure: BIOPSY;  Surgeon: Shellia Cleverly, DO;  Location: [facility] ENDOSCOPY;  Service: Gastroenterology;;  ? ESOPHAGOGASTRODUODENOSCOPY (EGD) WITH PROPOFOL N/A 10/12/2018  ? Procedure: ESOPHAGOGASTRODUODENOSCOPY (EGD) WITH  PROPOFOL;  Surgeon: Lynann Bologna, MD;  Location: [facility] ENDOSCOPY;  Service: Endoscopy;  Laterality: N/A;  ? ESOPHAGOGASTRODUODENOSCOPY (EGD) WITH PROPOFOL N/A 02/28/2020  ? Procedure: ESOPHAGOGASTRODUODENOSCOPY (EGD) WITH PROPOFOL;  Surgeon: Shellia Cleverly, DO;  Location: [facility] ENDOSCOPY;  Service: Gastroenterology;  Laterality: N/A;  ? FLEXIBLE SIGMOIDOSCOPY N/A 02/28/2020  ? Procedure: FLEXIBLE SIGMOIDOSCOPY;  Surgeon: Shellia Cleverly, DO;  Location: [facility] ENDOSCOPY;  Service: Gastroenterology;  Laterality: N/A;  ? HERNIA REPAIR    ? LEFT HEART CATH AND CORONARY ANGIOGRAPHY N/A 10/13/2018  ? Procedure: LEFT HEART CATH AND CORONARY ANGIOGRAPHY;  Surgeon: Kathleene Hazel, MD;  Location: [facility] INVASIVE CV LAB;  Service: Cardiovascular;  Laterality: N/A;  ? TONSILLECTOMY AND ADENOIDECTOMY    ? WISDOM TOOTH EXTRACTION  2017  ? ? ?OB History   ?No obstetric history on file. ?  ? ? ? ?Home Medications   ? ?Prior to Admission medications   ?Medication Sig Start Date End Date Taking? Authorizing Provider  ?ARIPiprazole (ABILIFY) 10 MG tablet Take 10 mg by mouth in the morning and at bedtime.     [provider]  ?atorvastatin (LIPITOR) 20 MG tablet Take 1 tablet (20 mg total) by mouth daily at 6 PM. ?Patient taking differently: Take 20 mg by mouth daily. 10/05/18   Storm Frisk, MD  ?capsaicin (ZOSTRIX) 0.025 % cream Apply topically 2 (two) times daily. 02/29/20   Ghimire, Werner Lean, MD  ?diclofenac Sodium (VOLTAREN) 1 % GEL Apply 1 application topically 2 (two) times daily as needed (chest pains).    [provider]  ?insulin aspart (NOVOLOG FLEXPEN) 100 UNIT/ML FlexPen Inject 15 Units into the skin 2 (two) times daily before a meal.    [provider]  ?insulin glargine (LANTUS) 100 unit/mL SOPN Inject 30 Units into the skin at bedtime. 02/29/20   Ghimire, Werner Lean, MD  ?lamoTRIgine (LAMICTAL) 25 MG tablet Take 2 tablets (50 mg total) by mouth 2 (two) times daily. For mood stabilization  11/11/18   Armandina Stammer I, NP  ?lubiprostone (AMITIZA) 24 MCG capsule Take 1 capsule (24 mcg total) by mouth 2 (two) times daily with a meal. ?Patient taking differently: Take 24 mcg by mouth daily with breakfast. 10/27/18   Esterwood, Amy S, PA-C  ?metoCLOPramide (REGLAN) 10 MG tablet Take 1 tablet (10 mg total) by mouth 3 (three) times daily with meals. 05/07/21 05/07/22  Loleta [name], MD  ?mirtazapine (REMERON) 7.5 MG tablet Take 1 tablet (7.5 mg total) by mouth at bedtime. For depression/sleep 11/11/18   Sanjuana Kava, NP  ?Multiple Vitamin (MULTIVITAMIN WITH MINERALS) TABS tablet Take 1 tablet by mouth daily. 09/18/18   Clapacs, Jackquline Denmark, MD  ?ondansetron (ZOFRAN ODT) 4 MG disintegrating tablet 4mg  ODT q4 hours prn nausea/vomit 08/21/20   Mesner, 08/23/20, MD  ?propranolol (INDERAL) 20 MG tablet Take 1 tablet (20 mg total) by mouth 2 (two) times daily. For anxiety/HTN ?Patient taking differently: Take 20 mg by mouth daily. For anxiety/HTN 12/23/18   Swayze, Ava, DO  ?QUEtiapine (SEROQUEL) 100 MG tablet Take 300 mg by mouth at bedtime. 07/05/19  [provider]  ?QUEtiapine (SEROQUEL) 50 MG tablet Take 50 mg by mouth 2 (two) times daily. ?Patient not taking: Reported on 03/07/2021 12/13/20   [provider]  ?senna-docusate (SENOKOT-S) 8.6-50 MG tablet Take 2 tablets by mouth at bedtime. 02/29/20   Ghimire, Werner Lean, MD  ?topiramate (TOPAMAX) 25 MG tablet Take 1 tablet (25 mg total) by mouth 2 (two) times daily. For seizure activities 11/11/18   Armandina Stammer I, NP  ?prochlorperazine (COMPAZINE) 25 MG suppository PLACE 1 SUPPOSITORY (25 MG TOTAL) RECTALLY EVERY TWELVE HOURS AS NEEDED FOR NAUSEA OR VOMITING. 02/29/20 02/29/20  Maretta Bees, MD  ? ? ?Family History ?Family History  ?Problem Relation Age of Onset  ? Diabetes Mother   ? Hypertension Mother   ? Obesity Mother   ? Asthma Mother   ? Allergic rhinitis Mother   ? Eczema Mother   ? Cervical cancer Mother   ? Diabetes Father   ? Hypertension Father    ? Obesity Father   ? Hyperlipidemia Father   ? Hypertension Paternal Aunt   ? Hypertension Maternal Grandfather   ? Colon cancer Maternal Grandfather   ? Diabetes Paternal Grandmother   ? Obesity Paternal [place]

## 2021-05-15 NOTE — Discharge Instructions (Addendum)
Go to the emergency department for evaluation of your nausea, vomiting, diarrhea in the setting of uncontrolled type 1 diabetes. ?

## 2021-06-02 ENCOUNTER — Emergency Department: Payer: Medicaid Other

## 2021-06-02 ENCOUNTER — Other Ambulatory Visit: Payer: Self-pay

## 2021-06-02 ENCOUNTER — Encounter: Payer: Self-pay | Admitting: Emergency Medicine

## 2021-06-02 ENCOUNTER — Emergency Department
Admission: EM | Admit: 2021-06-02 | Discharge: 2021-06-02 | Disposition: A | Discharge status: Home / Self Care | Payer: Medicaid Other | Attending: Emergency Medicine | Admitting: Emergency Medicine

## 2021-06-02 DIAGNOSIS — E1110 Type 2 diabetes mellitus with ketoacidosis without coma: Secondary | ICD-10-CM | POA: Diagnosis not present

## 2021-06-02 DIAGNOSIS — Z79899 Other long term (current) drug therapy: Secondary | ICD-10-CM | POA: Diagnosis not present

## 2021-06-02 DIAGNOSIS — R112 Nausea with vomiting, unspecified: Secondary | ICD-10-CM | POA: Diagnosis not present

## 2021-06-02 DIAGNOSIS — R079 Chest pain, unspecified: Secondary | ICD-10-CM | POA: Diagnosis not present

## 2021-06-02 DIAGNOSIS — R1084 Generalized abdominal pain: Secondary | ICD-10-CM | POA: Diagnosis not present

## 2021-06-02 DIAGNOSIS — I110 Hypertensive heart disease with heart failure: Secondary | ICD-10-CM | POA: Diagnosis not present

## 2021-06-02 DIAGNOSIS — R55 Syncope and collapse: Secondary | ICD-10-CM | POA: Diagnosis not present

## 2021-06-02 DIAGNOSIS — Z794 Long term (current) use of insulin: Secondary | ICD-10-CM | POA: Diagnosis not present

## 2021-06-02 DIAGNOSIS — R000 Tachycardia, unspecified: Secondary | ICD-10-CM | POA: Insufficient documentation

## 2021-06-02 DIAGNOSIS — I509 Heart failure, unspecified: Secondary | ICD-10-CM | POA: Insufficient documentation

## 2021-06-02 DIAGNOSIS — N9489 Other specified conditions associated with female genital organs and menstrual cycle: Secondary | ICD-10-CM | POA: Diagnosis not present

## 2021-06-02 DIAGNOSIS — R42 Dizziness and giddiness: Secondary | ICD-10-CM | POA: Insufficient documentation

## 2021-06-02 DIAGNOSIS — E1165 Type 2 diabetes mellitus with hyperglycemia: Secondary | ICD-10-CM | POA: Diagnosis not present

## 2021-06-02 LAB — URINE DRUG SCREEN, QUALITATIVE (ARMC ONLY)
Amphetamines, Ur Screen: NOT DETECTED
Barbiturates, Ur Screen: NOT DETECTED
Benzodiazepine, Ur Scrn: NOT DETECTED
Cannabinoid 50 Ng, Ur ~~LOC~~: POSITIVE — AB
Cocaine Metabolite,Ur ~~LOC~~: NOT DETECTED
MDMA (Ecstasy)Ur Screen: NOT DETECTED
Methadone Scn, Ur: NOT DETECTED
Opiate, Ur Screen: POSITIVE — AB
Phencyclidine (PCP) Ur S: NOT DETECTED
Tricyclic, Ur Screen: NOT DETECTED

## 2021-06-02 LAB — CBC WITH DIFFERENTIAL/PLATELET
Abs Immature Granulocytes: 0.02 10*3/uL (ref 0.00–0.07)
Basophils Absolute: 0 10*3/uL (ref 0.0–0.1)
Basophils Relative: 0 %
Eosinophils Absolute: 0 10*3/uL (ref 0.0–0.5)
Eosinophils Relative: 0 %
HCT: 38 % (ref 36.0–46.0)
Hemoglobin: 12.3 g/dL (ref 12.0–15.0)
Immature Granulocytes: 0 %
Lymphocytes Relative: 15 %
Lymphs Abs: 1.3 10*3/uL (ref 0.7–4.0)
MCH: 24.5 pg — ABNORMAL LOW (ref 26.0–34.0)
MCHC: 32.4 g/dL (ref 30.0–36.0)
MCV: 75.5 fL — ABNORMAL LOW (ref 80.0–100.0)
Monocytes Absolute: 0.9 10*3/uL (ref 0.1–1.0)
Monocytes Relative: 10 %
Neutro Abs: 6.7 10*3/uL (ref 1.7–7.7)
Neutrophils Relative %: 75 %
Platelets: 399 10*3/uL (ref 150–400)
RBC: 5.03 MIL/uL (ref 3.87–5.11)
RDW: 14.6 % (ref 11.5–15.5)
WBC: 9 10*3/uL (ref 4.0–10.5)
nRBC: 0 % (ref 0.0–0.2)

## 2021-06-02 LAB — URINALYSIS, ROUTINE W REFLEX MICROSCOPIC
Bacteria, UA: NONE SEEN
Bilirubin Urine: NEGATIVE
Glucose, UA: 500 mg/dL — AB
Hgb urine dipstick: NEGATIVE
Ketones, ur: 80 mg/dL — AB
Leukocytes,Ua: NEGATIVE
Nitrite: NEGATIVE
Specific Gravity, Urine: <1.005 — ABNORMAL LOW (ref 1.005–1.030)
pH: 5.5 (ref 5.0–8.0)

## 2021-06-02 LAB — BLOOD GAS, VENOUS
Acid-Base Excess: 3.8 mmol/L — ABNORMAL HIGH (ref 0.0–2.0)
Bicarbonate: 29.2 mmol/L — ABNORMAL HIGH (ref 20.0–28.0)
O2 Saturation: 68.1 %
Patient temperature: 37
pCO2, Ven: 46 mmHg (ref 44–60)
pH, Ven: 7.41 (ref 7.25–7.43)
pO2, Ven: 42 mmHg (ref 32–45)

## 2021-06-02 LAB — COMPREHENSIVE METABOLIC PANEL
ALT: 37 U/L (ref 0–44)
AST: 34 U/L (ref 15–41)
Albumin: 4.2 g/dL (ref 3.5–5.0)
Alkaline Phosphatase: 75 U/L (ref 38–126)
Anion gap: 13 (ref 5–15)
BUN: 15 mg/dL (ref 6–20)
CO2: 26 mmol/L (ref 22–32)
Calcium: 10.1 mg/dL (ref 8.9–10.3)
Chloride: 97 mmol/L — ABNORMAL LOW (ref 98–111)
Creatinine, Ser: 0.45 mg/dL (ref 0.44–1.00)
GFR, Estimated: >60 mL/min (ref >60)
Glucose, Bld: 246 mg/dL — ABNORMAL HIGH (ref 70–99)
Potassium: 3.7 mmol/L (ref 3.5–5.1)
Sodium: 136 mmol/L (ref 135–145)
Total Bilirubin: 1.1 mg/dL (ref 0.3–1.2)
Total Protein: 8.2 g/dL — ABNORMAL HIGH (ref 6.5–8.1)

## 2021-06-02 LAB — ETHANOL: Alcohol, Ethyl (B): <10 mg/dL (ref <10)

## 2021-06-02 LAB — HCG, QUANTITATIVE, PREGNANCY: hCG, Beta Chain, Quant, S: <1 m[IU]/mL (ref <5)

## 2021-06-02 LAB — LIPASE, BLOOD: Lipase: 25 U/L (ref 11–51)

## 2021-06-02 LAB — TROPONIN I (HIGH SENSITIVITY)
Troponin I (High Sensitivity): 8 ng/L (ref <18)
Troponin I (High Sensitivity): 8 ng/L (ref <18)

## 2021-06-02 LAB — BETA-HYDROXYBUTYRIC ACID: Beta-Hydroxybutyric Acid: 1.8 mmol/L — ABNORMAL HIGH (ref 0.05–0.27)

## 2021-06-02 MED ORDER — FAMOTIDINE IN NACL 20-0.9 MG/50ML-% IV SOLN
20.0000 mg | Freq: Once | INTRAVENOUS | Status: AC
  Administered 2021-06-02: 20 mg via INTRAVENOUS
  Filled 2021-06-02: qty 50

## 2021-06-02 MED ORDER — DROPERIDOL 2.5 MG/ML IJ SOLN
2.5000 mg | Freq: Once | INTRAMUSCULAR | Status: AC
  Administered 2021-06-02: 2.5 mg via INTRAVENOUS
  Filled 2021-06-02: qty 2

## 2021-06-02 MED ORDER — SODIUM CHLORIDE 0.9 % IV BOLUS
1000.0000 mL | Freq: Once | INTRAVENOUS | Status: AC
  Administered 2021-06-02: 1000 mL via INTRAVENOUS

## 2021-06-02 MED ORDER — CAPSAICIN 0.075 % EX CREA
TOPICAL_CREAM | Freq: Once | CUTANEOUS | Status: DC
  Filled 2021-06-02: qty 60

## 2021-06-02 MED ORDER — CAPSAICIN 0.025 % EX CREA
TOPICAL_CREAM | Freq: Once | CUTANEOUS | Status: AC
  Filled 2021-06-02: qty 60

## 2021-06-02 MED ORDER — IOHEXOL 300 MG/ML  SOLN
100.0000 mL | Freq: Once | INTRAMUSCULAR | Status: AC | PRN
  Administered 2021-06-02: 100 mL via INTRAVENOUS

## 2021-06-02 NOTE — ED Notes (Signed)
Pt states that nausea has subsided at this time.

## 2021-06-02 NOTE — ED Notes (Signed)
Pt ambulatory to the restroom at this time.  

## 2021-06-02 NOTE — ED Triage Notes (Signed)
Pt arrives via [facility], c/o CP and dizziness w/LOC and hitting head "more than 10" times yesterday and abd pain x 3 days. Hx seizures, CHF, DM.  Pt endorses CP,abd pain, nausea at this time. 324 aspirin given by EMS.  Pt AO4 at this time.

## 2021-06-02 NOTE — ED Provider Notes (Signed)
[facility] Provider Note    Event Date/Time   First MD Initiated Contact with Patient 06/02/21 [phone]     (approximate)   History   Chest Pain   HPI  Nancy [name] is a 21 y.o. female brought to the ED via EMS from home with a chief complaint of abdominal pain, nausea/vomiting, chest pain and dizziness with syncope.  Patient has a history of cannabinoid hyper's emesis syndrome, seizures on Topamax, diabetes with history of DKA who states she has not slept all week and has been having intermittent abdominal pain with nausea, worsening over the past 3 days.  Denies fever, shortness of breath, dysuria or diarrhea.     Past Medical History   Past Medical History:  Diagnosis Date   Acanthosis nigricans    Anxiety    CHF (congestive heart failure) (HCC)    Chronic lower back pain    Depression    DKA, type 1 (HCC) 09/13/2018   Dyspepsia    Obesity    Ovarian cyst    pt is not aware of this hx (11/24/2017)   Precocious adrenarche (HCC)    Premature baby    Seizures (HCC)    Type II diabetes mellitus (HCC)    insulin dependant     Active Problem List   Patient Active Problem List   Diagnosis Date Noted   Microcytic anemia 02/12/2021   Uncontrolled type 1 diabetes mellitus with hyperglycemia, with long-term current use of insulin (HCC) 02/12/2021   Left inguinal abscess 02/12/2021   Asymptomatic bacteriuria 02/12/2021   Nicotine dependence 02/12/2021   Abdominal pain 02/22/2020   Chronic constipation 02/22/2020   Nausea & vomiting 02/22/2020   SIRS (systemic inflammatory response syndrome) (HCC) 01/14/2020   Diabetic gastroparesis (HCC) 11/25/2019   Polysubstance abuse (HCC) 11/25/2019   Gastritis 11/25/2019   DKA, type 1 (HCC) 11/24/2019   Cyclical vomiting with nausea 10/01/2019   Intractable nausea and vomiting 09/30/2019   Cellulitis    DKA (diabetic ketoacidoses) 01/27/2019   Leukocytosis 12/19/2018   Dehydration    Pseudoseizures  (HCC)    Type 1 diabetes mellitus with ketoacidosis without coma (HCC) 11/28/2018   Hyperglycemia 11/27/2018   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   MDD (major depressive disorder), recurrent episode, severe (HCC) 11/06/2018   Nonspecific abnormal electrocardiogram (ECG) (EKG)    Chest pain of uncertain etiology    Hypertensive urgency 10/08/2018   Conversion disorder with attacks or seizures, acute episode, with psychological stressor 09/16/2018   MDD (major depressive disorder), recurrent severe, without psychosis (HCC)    Hypokalemia 08/04/2018   AKI (acute kidney injury) (HCC) 08/04/2018   Seizure-like activity (HCC) 08/03/2018   Depression 07/25/2018   Syncope 01/30/2018   Orthostatic hypotension 01/24/2018   Tachycardia 12/28/2017   Chronic abdominal pain 12/24/2017   Chest pain 12/19/2017   Nausea and vomiting 08/21/2017   Generalized abdominal pain 08/21/2017   Non compliance with medical treatment 01/27/2012   Adjustment disorder 09/16/2011   Acanthosis nigricans    Goiter    Obesity 06/14/2010   Hypertension 06/14/2010     Past Surgical History   Past Surgical History:  Procedure Laterality Date   ABDOMINAL HERNIA REPAIR     "I was a baby"   BIOPSY  10/12/2018   Procedure: BIOPSY;  Surgeon: Lynann Bologna, MD;  Location: [facility] ENDOSCOPY;  Service: Endoscopy;;   BIOPSY  02/28/2020   Procedure: BIOPSY;  Surgeon: Shellia Cleverly, DO;  Location: [facility] ENDOSCOPY;  Service: Gastroenterology;;   ESOPHAGOGASTRODUODENOSCOPY (EGD) WITH PROPOFOL N/A 10/12/2018   Procedure: ESOPHAGOGASTRODUODENOSCOPY (EGD) WITH PROPOFOL;  Surgeon: Lynann Bologna, MD;  Location: [facility] ENDOSCOPY;  Service: Endoscopy;  Laterality: N/A;   ESOPHAGOGASTRODUODENOSCOPY (EGD) WITH PROPOFOL N/A 02/28/2020   Procedure: ESOPHAGOGASTRODUODENOSCOPY (EGD) WITH PROPOFOL;  Surgeon: Shellia Cleverly, DO;  Location: [facility] ENDOSCOPY;  Service: Gastroenterology;  Laterality: N/A;    FLEXIBLE SIGMOIDOSCOPY N/A 02/28/2020   Procedure: FLEXIBLE SIGMOIDOSCOPY;  Surgeon: Shellia Cleverly, DO;  Location: [facility] ENDOSCOPY;  Service: Gastroenterology;  Laterality: N/A;   HERNIA REPAIR     LEFT HEART CATH AND CORONARY ANGIOGRAPHY N/A 10/13/2018   Procedure: LEFT HEART CATH AND CORONARY ANGIOGRAPHY;  Surgeon: Kathleene Hazel, MD;  Location: [facility] INVASIVE CV LAB;  Service: Cardiovascular;  Laterality: N/A;   TONSILLECTOMY AND ADENOIDECTOMY     WISDOM TOOTH EXTRACTION  2017     Home Medications   Prior to Admission medications   Medication Sig Start Date End Date Taking? Authorizing Provider  ARIPiprazole (ABILIFY) 10 MG tablet Take 10 mg by mouth in the morning and at bedtime.     [provider]  atorvastatin (LIPITOR) 20 MG tablet Take 1 tablet (20 mg total) by mouth daily at 6 PM. Patient taking differently: Take 20 mg by mouth daily. 10/05/18   Storm Frisk, MD  capsaicin (ZOSTRIX) 0.025 % cream Apply topically 2 (two) times daily. 02/29/20   Ghimire, Werner Lean, MD  diclofenac Sodium (VOLTAREN) 1 % GEL Apply 1 application topically 2 (two) times daily as needed (chest pains).    [provider]  insulin aspart (NOVOLOG FLEXPEN) 100 UNIT/ML FlexPen Inject 15 Units into the skin 2 (two) times daily before a meal.    [provider]  insulin glargine (LANTUS) 100 unit/mL SOPN Inject 30 Units into the skin at bedtime. 02/29/20   Ghimire, Werner Lean, MD  lamoTRIgine (LAMICTAL) 25 MG tablet Take 2 tablets (50 mg total) by mouth 2 (two) times daily. For mood stabilization 11/11/18   Armandina Stammer I, NP  lubiprostone (AMITIZA) 24 MCG capsule Take 1 capsule (24 mcg total) by mouth 2 (two) times daily with a meal. Patient taking differently: Take 24 mcg by mouth daily with breakfast. 10/27/18   Esterwood, Amy S, PA-C  metoCLOPramide (REGLAN) 10 MG tablet Take 1 tablet (10 mg total) by mouth 3 (three) times daily with meals. 05/07/21 05/07/22  Loleta [name], MD   mirtazapine (REMERON) 7.5 MG tablet Take 1 tablet (7.5 mg total) by mouth at bedtime. For depression/sleep 11/11/18   Armandina Stammer I, NP  Multiple Vitamin (MULTIVITAMIN WITH MINERALS) TABS tablet Take 1 tablet by mouth daily. 09/18/18   Clapacs, Jackquline Denmark, MD  ondansetron (ZOFRAN ODT) 4 MG disintegrating tablet  ODT q4 hours prn nausea/vomit 08/21/20   Mesner, Barbara Cower, MD  propranolol (INDERAL) 20 MG tablet Take 1 tablet (20 mg total) by mouth 2 (two) times daily. For anxiety/HTN Patient taking differently: Take 20 mg by mouth daily. For anxiety/HTN 12/23/18   Swayze, Ava, DO  QUEtiapine (SEROQUEL) 100 MG tablet Take 300 mg by mouth at bedtime. 07/05/19   [provider]  QUEtiapine (SEROQUEL) 50 MG tablet Take 50 mg by mouth 2 (two) times daily. Patient not taking: Reported on 03/07/2021 12/13/20   [provider]  senna-docusate (SENOKOT-S) 8.6-50 MG tablet Take 2 tablets by mouth at bedtime. 02/29/20   Ghimire, Werner Lean, MD  topiramate (TOPAMAX) 25 MG tablet Take 1 tablet (25 mg  total) by mouth 2 (two) times daily. For seizure activities 11/11/18   Armandina Stammer I, NP  prochlorperazine (COMPAZINE) 25 MG suppository PLACE 1 SUPPOSITORY (25 MG TOTAL) RECTALLY EVERY TWELVE HOURS AS NEEDED FOR NAUSEA OR VOMITING. 02/29/20 02/29/20  Maretta Bees, MD     Allergies  Tomato, Ibuprofen, and Oatmeal   Family History   Family History  Problem Relation Age of Onset   Diabetes Mother    Hypertension Mother    Obesity Mother    Asthma Mother    Allergic rhinitis Mother    Eczema Mother    Cervical cancer Mother    Diabetes Father    Hypertension Father    Obesity Father    Hyperlipidemia Father    Hypertension Paternal Aunt    Hypertension Maternal Grandfather    Colon cancer Maternal Grandfather    Diabetes Paternal Grandmother    Obesity Paternal Grandmother    Diabetes Paternal Grandfather    Obesity Paternal Grandfather    Angioedema Neg Hx    Immunodeficiency Neg Hx     Urticaria Neg Hx    Stomach cancer Neg Hx    Esophageal cancer Neg Hx      Physical Exam  Triage Vital Signs: ED Triage Vitals [06/02/21 0444]  Enc Vitals Group     BP      Pulse      Resp      Temp      Temp src      SpO2 100 %     Weight      Height      Head Circumference      Peak Flow      Pain Score      Pain Loc      Pain Edu?      Excl. in GC?     Updated Vital Signs: BP 131/77   Pulse (!) 137   Temp 98.7 F (37.1 C) (Oral)   Resp (!) 24   Ht  (1.575 m)   Wt 83.9 kg   LMP 05/06/2021 (Exact Date)   SpO2 100%   BMI 33.83 kg/m    General: Awake, moderate distress.  CV:  Tachycardic.  Good peripheral perfusion.  Resp:  Normal effort.  CTA B. Abd:  Actively vomiting.  Mild tenderness to epigastrium without rebound or guarding.  No distention.  Other:  Calves are nontender and nonswollen.   ED Results / Procedures / Treatments  Labs (all labs ordered are listed, but only abnormal results are displayed) Labs Reviewed  CBC WITH DIFFERENTIAL/PLATELET - Abnormal; Notable for the following components:      Result Value   MCV 75.5 (*)    MCH 24.5 (*)    All other components within normal limits  COMPREHENSIVE METABOLIC PANEL - Abnormal; Notable for the following components:   Chloride 97 (*)    Glucose, Bld 246 (*)    Total Protein 8.2 (*)    All other components within normal limits  BETA-HYDROXYBUTYRIC ACID - Abnormal; Notable for the following components:   Beta-Hydroxybutyric Acid 1.80 (*)    All other components within normal limits  ETHANOL  LIPASE, BLOOD  HCG, QUANTITATIVE, PREGNANCY  URINE DRUG SCREEN, QUALITATIVE ([facility] ONLY)  URINALYSIS, ROUTINE W REFLEX MICROSCOPIC  BLOOD GAS, VENOUS  TROPONIN I (HIGH SENSITIVITY)  TROPONIN I (HIGH SENSITIVITY)     EKG  ED ECG REPORT I, Montrail Mehrer J, the attending physician, personally viewed and interpreted this ECG.   Date:  06/02/2021  EKG Time: 0457  Rate: 145  Rhythm: sinus  tachycardia  Axis: Normal  Intervals:none  ST&T Change: Nonspecific    RADIOLOGY I have independently visualized and interpreted patient's CTs and chest x-ray as well as noted the radiology interpretation:  CT head: No ICH  CT abdomen pelvis: No acute abnormality   Chest x-ray: No acute process  Official radiology report(s): CT Head Wo Contrast  Result Date: 06/02/2021 CLINICAL DATA:  Dizziness and loss of consciousness EXAM: CT HEAD WITHOUT CONTRAST TECHNIQUE: Contiguous axial images were obtained from the base of the skull through the vertex without intravenous contrast. RADIATION DOSE REDUCTION: This exam was performed according to the departmental dose-optimization program which includes automated exposure control, adjustment of the mA and/or kV according to patient size and/or use of iterative reconstruction technique. COMPARISON:  05/06/2021 FINDINGS: Brain: No evidence of acute infarction, hemorrhage, hydrocephalus, extra-axial collection or mass lesion/mass effect. Vascular: No hyperdense vessel or unexpected calcification. Skull: Normal. Negative for fracture or focal lesion. Sinuses/Orbits: No acute finding. IMPRESSION: Negative head CT. Electronically Signed   By: Tiburcio Pea M.D.   On: 06/02/2021 06:19   CT Abdomen Pelvis W Contrast  Result Date: 06/02/2021 CLINICAL DATA:  Nausea and vomiting with abdominal pain EXAM: CT ABDOMEN AND PELVIS WITH CONTRAST TECHNIQUE: Multidetector CT imaging of the abdomen and pelvis was performed using the standard protocol following bolus administration of intravenous contrast. RADIATION DOSE REDUCTION: This exam was performed according to the departmental dose-optimization program which includes automated exposure control, adjustment of the mA and/or kV according to patient size and/or use of iterative reconstruction technique. CONTRAST:  OMNIPAQUE IOHEXOL 300 MG/ML  SOLN COMPARISON:  02/12/2021 FINDINGS: Lower chest:  No contributory  findings. Hepatobiliary: No focal liver abnormality.No evidence of biliary obstruction or stone. Pancreas: Unremarkable. Spleen: Unremarkable. Adrenals/Urinary Tract: 14 mm left adrenal nodule stable since at least 2021 and consistent with adenoma. No hydronephrosis or stone. Unremarkable bladder. Stomach/Bowel:  No obstruction. No appendicitis. Vascular/Lymphatic: No acute vascular abnormality. No mass or adenopathy. Reproductive:No pathologic findings.  Corpus luteum on the right Other: No ascites or pneumoperitoneum. Resolved left groin inflammation. Musculoskeletal: No acute abnormalities. IMPRESSION: No acute finding. Negative for bowel obstruction or visible inflammation. Electronically Signed   By: Tiburcio Pea M.D.   On: 06/02/2021 06:18   DG Chest Port 1 View  Result Date: 06/02/2021 CLINICAL DATA:  Chest pain EXAM: PORTABLE CHEST 1 VIEW COMPARISON:  03/07/2021 FINDINGS: Artifact from EKG leads. Normal heart size and mediastinal contours. No acute infiltrate or edema. No effusion or pneumothorax. No acute osseous findings. IMPRESSION: Negative portable chest. Electronically Signed   By: Tiburcio Pea M.D.   On: 06/02/2021 05:29     PROCEDURES:  Critical Care performed: Yes, see critical care procedure note(s)  CRITICAL CARE Performed by: Irean Hong   Total critical care time: 45 minutes  Critical care time was exclusive of separately billable procedures and treating other patients.  Critical care was necessary to treat or prevent imminent or life-threatening deterioration.  Critical care was time spent personally by me on the following activities: development of treatment plan with patient and/or surrogate as well as nursing, discussions with consultants, evaluation of patient's response to treatment, examination of patient, obtaining history from patient or surrogate, ordering and performing treatments and interventions, ordering and review of laboratory studies, ordering and  review of radiographic studies, pulse oximetry and re-evaluation of patient's condition.   Marland Kitchen1-3 Lead EKG Interpretation Performed by: Irean Hong, MD  Authorized by: Irean Hong, MD     Interpretation: abnormal     ECG rate:  145   ECG rate assessment: tachycardic     Rhythm: sinus tachycardia     Ectopy: none     Conduction: normal   Comments:     Patient placed on cardiac monitor to evaluate for arrhythmias   MEDICATIONS ORDERED IN ED: Medications  capsaicin (ZOSTRIX) 0.025 % cream (has no administration in time range)  sodium chloride 0.9 % bolus 1,000 mL (has no administration in time range)  sodium chloride 0.9 % bolus 1,000 mL (1,000 mLs Intravenous New Bag/Given 06/02/21 0505)  droperidol (INAPSINE) 2.5 MG/ML injection 2.5 mg (2.5 mg Intravenous Given 06/02/21 0458)  famotidine (PEPCID) IVPB 20 mg premix (0 mg Intravenous Stopped 06/02/21 0558)  iohexol (OMNIPAQUE) 300 MG/ML solution 100 mL (100 mLs Intravenous Contrast Given 06/02/21 0607)     IMPRESSION / MDM / ASSESSMENT AND PLAN / ED COURSE  I reviewed the triage vital signs and the nursing notes.                             21 year old female presenting with chest and abdominal pain associated with nausea/vomiting.  I have identified this patient may potentially have a life-threatening condition. Differential diagnosis includes, but is not limited to, ACS, aortic dissection, pulmonary embolism, cardiac tamponade, pneumothorax, pneumonia, pericarditis, myocarditis, GI-related causes including esophagitis/gastritis, and musculoskeletal chest wall pain.    I have personally reviewed patient's records and see numerous ED visits for nausea/vomiting, most recently seen in our ED on 05/15/2021 for same and diagnosed with cannabinoid hyperemesis.  The patient is on the cardiac monitor to evaluate for evidence of arrhythmia and/or significant heart rate changes.  We will obtain lab work and urine, CT scans of head and  abdomen/pelvis.  Initiate IV fluid resuscitation, IV Droperidol for nausea/vomiting, topical Capsaicin.  Will reassess.  Clinical Course as of 06/02/21 6468  [facility] Jun 02, 2021  0559 Laboratory results demonstrate normal WBC 9, hyperglycemia without elevation of anion gap, normal LFTs/lipase, negative EtOH, negative troponin. Patient resting after administration of IV droperidol.  Going to CT now.  Awaiting results of beta hydroxybutyrate and VBG.  Awaiting urine specimen. [JS]  0636 CT head and abdomen pelvis unremarkable for acute abnormality.  Beta hydroxybutyrate is 1.8; do not feel patient is in DKA as she has no elevation of anion gap.  Likely beta hydroxybutyrate elevated secondary to emesis.  Patient remains tachycardic; will administer second liter IV fluids.  Nausea improved, patient texting on her cell phone.  Care will be transferred to the oncoming provider at change of shift pending repeat troponin, UA, VBG and reassessment. [JS]    Clinical Course User Index [JS] Irean Hong, MD     FINAL CLINICAL IMPRESSION(S) / ED DIAGNOSES   Final diagnoses:  Nonspecific chest pain  Generalized abdominal pain  Nausea and vomiting, unspecified vomiting type  Tachycardia     Rx / DC Orders   ED Discharge Orders     None        Note:  This document was prepared using Dragon voice recognition software and may include unintentional dictation errors.   Irean Hong, MD 06/02/21 [phone]

## 2021-06-02 NOTE — ED Notes (Signed)
MD Bradler verbal ok to dc pt with HR at this time. PT stating they feel much better.

## 2021-06-06 ENCOUNTER — Encounter (HOSPITAL_COMMUNITY): Payer: Self-pay

## 2021-06-06 ENCOUNTER — Emergency Department (HOSPITAL_COMMUNITY)
Admission: EM | Admit: 2021-06-06 | Discharge: 2021-06-06 | Disposition: A | Discharge status: Home / Self Care | Payer: Medicaid Other | Attending: Emergency Medicine | Admitting: Emergency Medicine

## 2021-06-06 DIAGNOSIS — Z5329 Procedure and treatment not carried out because of patient's decision for other reasons: Secondary | ICD-10-CM | POA: Insufficient documentation

## 2021-06-06 DIAGNOSIS — R000 Tachycardia, unspecified: Secondary | ICD-10-CM | POA: Diagnosis not present

## 2021-06-06 DIAGNOSIS — R1110 Vomiting, unspecified: Secondary | ICD-10-CM | POA: Insufficient documentation

## 2021-06-06 LAB — COMPREHENSIVE METABOLIC PANEL
ALT: 28 U/L (ref 0–44)
AST: 21 U/L (ref 15–41)
Albumin: 4.1 g/dL (ref 3.5–5.0)
Alkaline Phosphatase: 64 U/L (ref 38–126)
Anion gap: 10 (ref 5–15)
BUN: 10 mg/dL (ref 6–20)
CO2: 28 mmol/L (ref 22–32)
Calcium: 10 mg/dL (ref 8.9–10.3)
Chloride: 101 mmol/L (ref 98–111)
Creatinine, Ser: 0.41 mg/dL — ABNORMAL LOW (ref 0.44–1.00)
GFR, Estimated: >60 mL/min (ref >60)
Glucose, Bld: 229 mg/dL — ABNORMAL HIGH (ref 70–99)
Potassium: 3.5 mmol/L (ref 3.5–5.1)
Sodium: 139 mmol/L (ref 135–145)
Total Bilirubin: 1.2 mg/dL (ref 0.3–1.2)
Total Protein: 7.7 g/dL (ref 6.5–8.1)

## 2021-06-06 LAB — CBC
HCT: 35.9 % — ABNORMAL LOW (ref 36.0–46.0)
Hemoglobin: 11.6 g/dL — ABNORMAL LOW (ref 12.0–15.0)
MCH: 24.9 pg — ABNORMAL LOW (ref 26.0–34.0)
MCHC: 32.3 g/dL (ref 30.0–36.0)
MCV: 77 fL — ABNORMAL LOW (ref 80.0–100.0)
Platelets: 348 10*3/uL (ref 150–400)
RBC: 4.66 MIL/uL (ref 3.87–5.11)
RDW: 14.3 % (ref 11.5–15.5)
WBC: 6.8 10*3/uL (ref 4.0–10.5)
nRBC: 0 % (ref 0.0–0.2)

## 2021-06-06 LAB — I-STAT BETA HCG BLOOD, ED (MC, WL, AP ONLY): I-stat hCG, quantitative: <5 m[IU]/mL (ref <5)

## 2021-06-06 LAB — URINALYSIS, ROUTINE W REFLEX MICROSCOPIC
Bilirubin Urine: NEGATIVE
Glucose, UA: >=500 mg/dL — AB
Hgb urine dipstick: NEGATIVE
Ketones, ur: 20 mg/dL — AB
Nitrite: NEGATIVE
Protein, ur: 30 mg/dL — AB
Specific Gravity, Urine: 1.027 (ref 1.005–1.030)
pH: 7 (ref 5.0–8.0)

## 2021-06-06 LAB — LIPASE, BLOOD: Lipase: 26 U/L (ref 11–51)

## 2021-06-06 MED ORDER — SODIUM CHLORIDE 0.9 % IV BOLUS: 1000.0000 mL | Freq: Once | INTRAVENOUS | Status: DC

## 2021-06-06 MED ORDER — SODIUM CHLORIDE 0.9 % IV SOLN: Freq: Once | INTRAVENOUS | Status: DC

## 2021-06-06 MED ORDER — METOCLOPRAMIDE HCL 5 MG/ML IJ SOLN
10.0000 mg | Freq: Once | INTRAMUSCULAR | Status: AC
Start: 2021-06-06 — End: 2021-06-06
  Administered 2021-06-06: 10 mg via INTRAVENOUS
  Filled 2021-06-06: qty 2

## 2021-06-06 MED ORDER — CAPSAICIN 0.025 % EX CREA
TOPICAL_CREAM | Freq: Once | CUTANEOUS | Status: DC
  Filled 2021-06-06: qty 60

## 2021-06-06 NOTE — ED Triage Notes (Signed)
Pt arrived via POV, c/o n/v since 5/28. States she is out of nausea medication, was told to follow up with PCP. PCP told pt to come to ED.

## 2021-06-06 NOTE — ED Provider Notes (Signed)
[facility]-EMERGENCY DEPT Provider Note   CSN: [id number] Arrival date & time: 06/06/21  1204     History  Chief Complaint  Patient presents with   Emesis    Nancy [name] is a 21 y.o. female.  HPI Reports recurrent vomiting for the past 4 days.  She reports this is typical.  She reports has had multiple similar episodes with gastroparesis.  Patient reports typically she has Reglan but has run out.  Patient reports that she does not have any abdominal pain.  She reports she plans on seeing her GI doctor soon but would also like a refill for Reglan once her symptoms are improved.  No fever no chills.  No urinary symptoms.  Patient ports blood sugars have been running between 60 and 170.    Home Medications Prior to Admission medications   Medication Sig Start Date End Date Taking? Authorizing Provider  ARIPiprazole (ABILIFY) 10 MG tablet Take 10 mg by mouth in the morning and at bedtime.  Patient not taking: Reported on 06/02/2021    [provider]  atorvastatin (LIPITOR) 20 MG tablet Take 1 tablet (20 mg total) by mouth daily at 6 PM. Patient taking differently: Take 20 mg by mouth daily. 10/05/18   Storm Frisk, MD  capsaicin (ZOSTRIX) 0.025 % cream Apply topically 2 (two) times daily. Patient not taking: Reported on 06/02/2021 02/29/20   Maretta Bees, MD  diclofenac Sodium (VOLTAREN) 1 % GEL Apply 1 application topically 2 (two) times daily as needed (chest pains). Patient not taking: Reported on 06/02/2021    [provider]  insulin aspart (NOVOLOG FLEXPEN) 100 UNIT/ML FlexPen Inject 15 Units into the skin 2 (two) times daily before a meal. Patient not taking: Reported on 06/02/2021    [provider]  insulin glargine (LANTUS) 100 unit/mL SOPN Inject 30 Units into the skin at bedtime. Patient not taking: Reported on 06/02/2021 02/29/20   Maretta Bees, MD  lamoTRIgine (LAMICTAL) 25 MG tablet Take 2 tablets (50 mg  total) by mouth 2 (two) times daily. For mood stabilization Patient not taking: Reported on 06/02/2021 11/11/18   Armandina Stammer I, NP  losartan (COZAAR) 25 MG tablet Take 25 mg by mouth daily. 05/20/21   [provider]  lubiprostone (AMITIZA) 24 MCG capsule Take 1 capsule (24 mcg total) by mouth 2 (two) times daily with a meal. Patient taking differently: Take 24 mcg by mouth daily with breakfast. 10/27/18   Esterwood, Amy S, PA-C  metoCLOPramide (REGLAN) 10 MG tablet Take 1 tablet (10 mg total) by mouth 3 (three) times daily with meals. Patient not taking: Reported on 06/02/2021 05/07/21 05/07/22  Loleta [name], MD  mirtazapine (REMERON) 7.5 MG tablet Take 1 tablet (7.5 mg total) by mouth at bedtime. For depression/sleep Patient not taking: Reported on 06/02/2021 11/11/18   Armandina Stammer I, NP  Multiple Vitamin (MULTIVITAMIN WITH MINERALS) TABS tablet Take 1 tablet by mouth daily. Patient not taking: Reported on 06/02/2021 09/18/18   Clapacs, Jackquline Denmark, MD  ondansetron (ZOFRAN ODT) 4 MG disintegrating tablet 4mg  ODT q4 hours prn nausea/vomit Patient not taking: Reported on 06/02/2021 08/21/20   Mesner, 08/23/20, MD  promethazine (PHENERGAN) 12.5 MG tablet Take 12.5 mg by mouth every 8 (eight) hours as needed. Patient not taking: Reported on 06/02/2021 04/15/21   [provider]  propranolol (INDERAL) 20 MG tablet Take 1 tablet (20 mg total) by mouth 2 (two) times daily. For anxiety/HTN Patient taking differently: Take 20  mg by mouth daily. For anxiety/HTN 12/23/18   Swayze, Ava, DO  QUEtiapine (SEROQUEL) 100 MG tablet Take 300 mg by mouth at bedtime. 07/05/19   [provider]  QUEtiapine (SEROQUEL) 50 MG tablet Take 50 mg by mouth 2 (two) times daily. Patient not taking: Reported on 03/07/2021 12/13/20   [provider]  senna-docusate (SENOKOT-S) 8.6-50 MG tablet Take 2 tablets by mouth at bedtime. 02/29/20   Ghimire, Werner Lean, MD  topiramate (TOPAMAX) 25 MG tablet Take 1 tablet (25  mg total) by mouth 2 (two) times daily. For seizure activities Patient not taking: Reported on 06/02/2021 11/11/18   Armandina Stammer I, NP  prochlorperazine (COMPAZINE) 25 MG suppository PLACE 1 SUPPOSITORY (25 MG TOTAL) RECTALLY EVERY TWELVE HOURS AS NEEDED FOR NAUSEA OR VOMITING. 02/29/20 02/29/20  Maretta Bees, MD      Allergies    Tomato, Ibuprofen, and Oatmeal    Review of Systems   Review of Systems 10 systems reviewed and negative except HPI Physical Exam Updated Vital Signs BP (!) 99/53   Pulse (!) 119   Temp 98 F (36.7 C) (Oral)   Resp (!) 30   SpO2 100%  Physical Exam Constitutional:      Comments: Patient is alert.  She does not appear to be in acute distress.  No respiratory distress.  She is holding emesis bag but no active vomiting at this time.  HENT:     Head: Normocephalic and atraumatic.     Mouth/Throat:     Mouth: Mucous membranes are moist.     Pharynx: Oropharynx is clear.  Eyes:     Extraocular Movements: Extraocular movements intact.  Cardiovascular:     Comments: Borderline tachycardia no appreciable rub murmur gallop. Pulmonary:     Effort: Pulmonary effort is normal.     Breath sounds: Normal breath sounds.  Abdominal:     General: There is no distension.     Palpations: Abdomen is soft.     Tenderness: There is no abdominal tenderness. There is no guarding.  Musculoskeletal:        General: No swelling. Normal range of motion.     Right lower leg: No edema.     Left lower leg: No edema.  Skin:    General: Skin is warm and dry.  Neurological:     General: No focal deficit present.     Mental Status: She is oriented to person, place, and time.     Motor: No weakness.     Coordination: Coordination normal.    ED Results / Procedures / Treatments   Labs (all labs ordered are listed, but only abnormal results are displayed) Labs Reviewed  COMPREHENSIVE METABOLIC PANEL - Abnormal; Notable for the following components:      Result Value    Glucose, Bld 229 (*)    Creatinine, Ser 0.41 (*)    All other components within normal limits  CBC - Abnormal; Notable for the following components:   Hemoglobin 11.6 (*)    HCT 35.9 (*)    MCV 77.0 (*)    MCH 24.9 (*)    All other components within normal limits  URINALYSIS, ROUTINE W REFLEX MICROSCOPIC - Abnormal; Notable for the following components:   APPearance CLOUDY (*)    Glucose, UA >=500 (*)    Ketones, ur 20 (*)    Protein, ur 30 (*)    Leukocytes,Ua TRACE (*)    Bacteria, UA RARE (*)    All other  components within normal limits  LIPASE, BLOOD  I-STAT BETA HCG BLOOD, ED ([facility], [facility], [facility] ONLY)    EKG None  Radiology No results found.  Procedures Procedures    Medications Ordered in ED Medications  sodium chloride 0.9 % bolus 1,000 mL (1,000 mLs Intravenous Patient Refused/Not Given 06/06/21 1434)  0.9 %  sodium chloride infusion ( Intravenous Patient Refused/Not Given 06/06/21 1434)  capsaicin (ZOSTRIX) 0.025 % cream ( Topical Patient Refused/Not Given 06/06/21 1437)  metoCLOPramide (REGLAN) injection 10 mg (10 mg Intravenous Given 06/06/21 1432)    ED Course/ Medical Decision Making/ A&P                           Medical Decision Making Amount and/or Complexity of Data Reviewed Labs: ordered.  Risk OTC drugs. Prescription drug management.   Patient presents as outlined.  Clinically she does not show signs of DKA.  Her mental status is clear.  She does report recurrent vomiting.  She reports blood sugars have been controlled maximum around 170.  At this time lab work pending.  Will initiate rehydration and Reglan.  Patient does not have pain or fever.  Low suspicion for acute infection.  Patient has known history of cannabinol hyperemesis and reports working closely with the GI.  Was not able to reassess the patient after she had gotten Reglan and fluids initiated.  Patient left AMA apparently citing to nursing staff that her ride had arrived.  Based on patient's  clinical appearance, patient was reasonably stable and had capacity for making AMA decision making.  She did not show any signs of confusion.  EM able to review Labs returned and no evidence of DKA.  Her blood sugars at 229 with no anion gap and normal bicarb.  Patient had requested Reglan prescription but has not awaited any discharge instructions, thus I was not able to provide a follow-up plan, return precautions were prescription.        Final Clinical Impression(s) / ED Diagnoses Final diagnoses:  Hyperemesis  Left against medical advice    Rx / DC Orders ED Discharge Orders     None         Arby Barrette, MD 06/06/21 1531

## 2021-06-06 NOTE — ED Notes (Signed)
Patient asked if she could leave after receiving reglan. Donnald Garre, MD made aware

## 2021-06-06 NOTE — ED Provider Triage Note (Cosign Needed)
Emergency Medicine Provider Triage Evaluation Note  [name] , a 21 y.o. female  was evaluated in triage.  Pt complains of nausea, vomiting, abdominal pain.  States her symptoms have been ongoing for many years.  She is followed by GI for this.  States that she was initially seen in the ED on May 28 and discharged in stable conditions.  She states since going home her symptoms returned.  Reports persistent nausea and vomiting.  States abdominal pain is diffuse.  States that her symptoms are so severe that she typically only has 1-2 bowel movements per month.  Denies any urinary complaints.  Physical Exam  BP 111/78 (BP Location: Right Arm)   Pulse (!) 120   Temp 98 F (36.7 C) (Oral)   Resp 16   SpO2 100%  Gen:   Awake, no distress   Resp:  Normal effort  MSK:   Moves extremities without difficulty  Other:    Medical Decision Making  Medically screening exam initiated at 1:02 PM.  Appropriate orders placed.  [name] was informed that the remainder of the evaluation will be completed by another provider, this initial triage assessment does not replace that evaluation, and the importance of remaining in the ED until their evaluation is complete.   Placido Sou, PA-C 06/06/21 1303

## 2021-06-06 NOTE — ED Notes (Signed)
Patient wanting to leave AMA due to ride being here. Pfeiffer,MD made aware. Patient understood the reasoning behind signing AMA forms and did not want to wait for doctor to come by. Patient stated she will follow up with GI doctor.

## 2021-06-24 ENCOUNTER — Emergency Department
Admission: EM | Admit: 2021-06-24 | Discharge: 2021-06-24 | Disposition: A | Discharge status: Home / Self Care | Payer: Medicaid Other | Attending: Emergency Medicine | Admitting: Emergency Medicine

## 2021-06-24 ENCOUNTER — Other Ambulatory Visit: Payer: Self-pay

## 2021-06-24 DIAGNOSIS — R739 Hyperglycemia, unspecified: Secondary | ICD-10-CM

## 2021-06-24 DIAGNOSIS — R1110 Vomiting, unspecified: Secondary | ICD-10-CM

## 2021-06-24 DIAGNOSIS — N9489 Other specified conditions associated with female genital organs and menstrual cycle: Secondary | ICD-10-CM | POA: Insufficient documentation

## 2021-06-24 DIAGNOSIS — R112 Nausea with vomiting, unspecified: Secondary | ICD-10-CM | POA: Insufficient documentation

## 2021-06-24 DIAGNOSIS — E1165 Type 2 diabetes mellitus with hyperglycemia: Secondary | ICD-10-CM | POA: Diagnosis not present

## 2021-06-24 DIAGNOSIS — R1012 Left upper quadrant pain: Secondary | ICD-10-CM | POA: Insufficient documentation

## 2021-06-24 LAB — COMPREHENSIVE METABOLIC PANEL
ALT: 29 U/L (ref 0–44)
AST: 40 U/L (ref 15–41)
Albumin: 3.4 g/dL — ABNORMAL LOW (ref 3.5–5.0)
Alkaline Phosphatase: 88 U/L (ref 38–126)
Anion gap: 10 (ref 5–15)
BUN: 11 mg/dL (ref 6–20)
CO2: 30 mmol/L (ref 22–32)
Calcium: 9.8 mg/dL (ref 8.9–10.3)
Chloride: 98 mmol/L (ref 98–111)
Creatinine, Ser: <0.3 mg/dL — ABNORMAL LOW (ref 0.44–1.00)
Glucose, Bld: 276 mg/dL — ABNORMAL HIGH (ref 70–99)
Potassium: 3.1 mmol/L — ABNORMAL LOW (ref 3.5–5.1)
Sodium: 138 mmol/L (ref 135–145)
Total Bilirubin: 1.9 mg/dL — ABNORMAL HIGH (ref 0.3–1.2)
Total Protein: 7.1 g/dL (ref 6.5–8.1)

## 2021-06-24 LAB — CBC
HCT: 33.8 % — ABNORMAL LOW (ref 36.0–46.0)
Hemoglobin: 10.9 g/dL — ABNORMAL LOW (ref 12.0–15.0)
MCH: 24.5 pg — ABNORMAL LOW (ref 26.0–34.0)
MCHC: 32.2 g/dL (ref 30.0–36.0)
MCV: 76.1 fL — ABNORMAL LOW (ref 80.0–100.0)
Platelets: 239 10*3/uL (ref 150–400)
RBC: 4.44 MIL/uL (ref 3.87–5.11)
RDW: 13.3 % (ref 11.5–15.5)
WBC: 6.4 10*3/uL (ref 4.0–10.5)
nRBC: 0 % (ref 0.0–0.2)

## 2021-06-24 LAB — BETA-HYDROXYBUTYRIC ACID: Beta-Hydroxybutyric Acid: 0.55 mmol/L — ABNORMAL HIGH (ref 0.05–0.27)

## 2021-06-24 LAB — CBG MONITORING, ED: Glucose-Capillary: 288 mg/dL — ABNORMAL HIGH (ref 70–99)

## 2021-06-24 LAB — BLOOD GAS, VENOUS
Acid-Base Excess: 7.1 mmol/L — ABNORMAL HIGH (ref 0.0–2.0)
Bicarbonate: 32 mmol/L — ABNORMAL HIGH (ref 20.0–28.0)
O2 Saturation: 76.2 %
Patient temperature: 37
pCO2, Ven: 45 mmHg (ref 44–60)
pH, Ven: 7.46 — ABNORMAL HIGH (ref 7.25–7.43)
pO2, Ven: 45 mmHg (ref 32–45)

## 2021-06-24 LAB — HCG, QUANTITATIVE, PREGNANCY: hCG, Beta Chain, Quant, S: 1 m[IU]/mL (ref <5)

## 2021-06-24 MED ORDER — PANTOPRAZOLE SODIUM 40 MG IV SOLR
40.0000 mg | Freq: Once | INTRAVENOUS | Status: AC
  Administered 2021-06-24: 40 mg via INTRAVENOUS
  Filled 2021-06-24: qty 10

## 2021-06-24 MED ORDER — METOCLOPRAMIDE HCL 5 MG/ML IJ SOLN: 10.0000 mg | Freq: Once | INTRAMUSCULAR | Status: DC

## 2021-06-24 MED ORDER — SODIUM CHLORIDE 0.9 % IV BOLUS
1000.0000 mL | Freq: Once | INTRAVENOUS | Status: AC
  Administered 2021-06-24: 1000 mL via INTRAVENOUS

## 2021-06-24 MED ORDER — HALOPERIDOL LACTATE 5 MG/ML IJ SOLN: 2.0000 mg | Freq: Once | INTRAMUSCULAR | Status: DC

## 2021-06-24 MED ORDER — HALOPERIDOL LACTATE 5 MG/ML IJ SOLN
2.0000 mg | Freq: Once | INTRAMUSCULAR | Status: AC
  Administered 2021-06-24: 2 mg via INTRAVENOUS
  Filled 2021-06-24: qty 1

## 2021-06-24 NOTE — ED Triage Notes (Signed)
Pt with generalized abd pain for three weeks. Per ems pt with fsbs 325, pt states has been vomiting and not been able to tolerate po for several days.

## 2021-06-24 NOTE — Inpatient Diabetes Management (Signed)
Inpatient Diabetes Program Recommendations  AACE/ADA: New Consensus Statement on Inpatient Glycemic Control   Target Ranges:  Prepandial:   less than 140 mg/dL      Peak postprandial:   less than 180 mg/dL (1-2 hours)      Critically ill patients:  140 - 180 mg/dL    Latest Reference Range & Units 06/24/21 07:01  CO2 22 - 32 mmol/L 30  Glucose 70 - 99 mg/dL 017 (H)  Anion gap 5 - 15  10    Latest Reference Range & Units 06/24/21 06:59  Glucose-Capillary 70 - 99 mg/dL 510 (H)   Review of Glycemic Control  Diabetes history: DM Outpatient Diabetes medications: Lantus 30 units QHS, Novolog 15 units TID with meals Current orders for Inpatient glycemic control: None; in ED  Inpatient Diabetes Program Recommendations:    Insulin: If admitted, please consider ordering CBGs ACHS (or Q4H if NPO), Novolog 0-15 units TID with meals, Novolog 0-5 units QHS (if NPO, Novolog 0-15 units Q4H), and Semglee 15 units QHS.  NOTE: Noted patient in ED with N/V. Patient was inpatient 02/12/21-02/13/21 and inpatient diabetes coordinator spoke with patient on 02/13/21. Noted patient has been to ED or urgent care 8 times since then (today makes 9 ED/urgent care visits) for nausea and vomiting.    Thanks, Orlando Penner, RN, MSN, CDCES Diabetes Coordinator Inpatient Diabetes Program [phone] (Team Pager from 8am to 5pm)

## 2021-06-24 NOTE — Discharge Instructions (Signed)
We recommended that you stay in order to further treat your heart rate and your nausea and vomiting.  We have also not finished your work-up with having a pregnancy test and deciding if any imaging needs to be done but you have elected to want to leave AGAINST MEDICAL ADVICE.  You can return to the ER if you want to undergo further work-up and treatment.

## 2021-06-24 NOTE — ED Provider Notes (Signed)
[facility] Provider Note    Event Date/Time   First MD Initiated Contact with Patient 06/24/21 0809     (approximate)   History   Hyperglycemia   HPI  Nancy [name] is a 21 y.o. female with diabetes, gastroparesis who comes in with concern for abdominal pain.  Patient reports severe upper abdominal pain more in the left upper quadrant over the past 3 weeks.  Associate with nausea vomiting.  No diarrhea.  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  She reports having difficulty tolerating p.o. she denies any vaginal discharge or urinary symptoms.  Physical Exam   Triage Vital Signs: ED Triage Vitals  Enc Vitals Group     BP 06/24/21 0655 (!) 109/94     Pulse Rate 06/24/21 0655 (!) 135     Resp 06/24/21 0655 20     Temp 06/24/21 0658 98.7 F (37.1 C)     Temp Source 06/24/21 0655 Oral     SpO2 06/24/21 0655 100 %     Weight 06/24/21 0656 185 lb (83.9 kg)     Height 06/24/21 0656 5\' 3"  (1.6 m)     Head Circumference --      Peak Flow --      Pain Score 06/24/21 0656 9     Pain Loc --      Pain Edu? --      Excl. in GC? --     Most recent vital signs: Vitals:   06/24/21 0655 06/24/21 0658  BP: (!) 109/94   Pulse: (!) 135   Resp: 20   Temp:  98.7 F (37.1 C)  SpO2: 100%      General: Awake, no distress.  CV:  Good peripheral perfusion.  Resp:  Normal effort.  Abd:  No distention.  Some mild tenderness in the upper abdomen without any rebound or guarding Other:     ED Results / Procedures / Treatments   Labs (all labs ordered are listed, but only abnormal results are displayed) Labs Reviewed  CBC - Abnormal; Notable for the following components:      Result Value   Hemoglobin 10.9 (*)    HCT 33.8 (*)    MCV 76.1 (*)    MCH 24.5 (*)    All other components within normal limits  COMPREHENSIVE METABOLIC PANEL - Abnormal; Notable for the following components:   Potassium 3.1  (*)    Glucose, Bld 276 (*)    Creatinine, Ser <0.30 (*)    Albumin 3.4 (*)    Total Bilirubin 1.9 (*)    All other components within normal limits  BLOOD GAS, VENOUS - Abnormal; Notable for the following components:   pH, Ven 7.46 (*)    Bicarbonate 32.0 (*)    Acid-Base Excess 7.1 (*)    All other components within normal limits  BETA-HYDROXYBUTYRIC ACID - Abnormal; Notable for the following components:   Beta-Hydroxybutyric Acid 0.55 (*)    All other components within normal limits  CBG MONITORING, ED - Abnormal; Notable for the following components:   Glucose-Capillary 288 (*)    All other components within normal limits  URINALYSIS, ROUTINE W REFLEX MICROSCOPIC  CBG MONITORING, ED  POC URINE PREG, ED     EKG  My interpretation of EKG:  Sinus tachycardia rate of 144 without any ST elevation or T wave inversions, normal intervals  RADIOLOGY I have reviewed the CT personally interpreted from  06/02/2021 where patient does still have her gallbladder but no stones were noted.    IMPRESSION: No acute finding. Negative for bowel obstruction or visible inflammation.    PROCEDURES:  Critical Care performed: No  Procedures   MEDICATIONS ORDERED IN ED: Medications  sodium chloride 0.9 % bolus 1,000 mL (has no administration in time range)  haloperidol lactate (HALDOL) injection 2 mg (has no administration in time range)  pantoprazole (PROTONIX) injection 40 mg (has no administration in time range)     IMPRESSION / MDM / ASSESSMENT AND PLAN / ED COURSE  I reviewed the triage vital signs and the nursing notes.   Patient's presentation is most consistent with acute presentation with potential threat to life or bodily function.   Differential includes Suspect gastroparesis, marijuana hyperemesis although she is denying use, DKA, electrolyte abnormalities, AKI.  We will hold off on imaging at this time given she has had multiple ER visits previously we discussed the  harms of recurrent CT imaging unless there is a change in her acute pathology..  Patient is okay with that plan at this time and is requesting to hold off on CT.  Patient's had multiple CT scans that have been negative as well as an ultrasound of her abdomen in 2021 without evidence of cholecystitis.  CBC shows normal white count.  Hemoglobin slightly low but similar to baseline of 11.62 weeks ago. VBG without any evidence of acidosis CMP slightly low potassium at 3.1.  Glucose is 276 with normal anion gap.    Patient comes in significantly tachycardic.  We will give some IV fluids and IV Haldol to help with symptoms.  Patient is requesting to leave AGAINST MEDICAL ADVICE.  Patient reports that she is left AGAINST MEDICAL ADVICE multiple times in the past and that she knows the risk of leaving but she does not want to stay.  Patient is not intoxicated has capacity make this decision able to repeat back to me why want her to stay in order to make sure that she is not having ectopic pregnancy other acute life-threatening pathology.  She understands that this can lead to death or permanent disability patient reports that she is not sexually active and that she actually has a GI appointment at 9 AM.  She reports that she wants to try to make it to this.  She does report that her mom is going to come pick her up.  She does not want to stay for any other medications and is requesting to leave.  She understands that her GI doctor may tell her to come back to the ER due to her tachycardia or if she has worsening symptoms and she states that she be willing to recheck in if they want her to but at this time that she does not want to stay any longer and wants to go to her GI appointment  The patient is on the cardiac monitor to evaluate for evidence of arrhythmia and/or significant heart rate changes.      FINAL CLINICAL IMPRESSION(S) / ED DIAGNOSES   Final diagnoses:  Hyperglycemia  Hyperemesis     Rx  / DC Orders   ED Discharge Orders     None        Note:  This document was prepared using Dragon voice recognition software and may include unintentional dictation errors.   Concha Se, MD 06/24/21 [phone]

## 2021-06-25 LAB — TOPIRAMATE LEVEL: Topiramate Lvl: <1.5 ug/mL — ABNORMAL LOW (ref 2.0–25.0)

## 2021-06-28 ENCOUNTER — Emergency Department: Payer: Medicaid Other

## 2021-06-28 ENCOUNTER — Other Ambulatory Visit: Payer: Self-pay

## 2021-06-28 ENCOUNTER — Emergency Department
Admission: EM | Admit: 2021-06-28 | Discharge: 2021-06-28 | Disposition: A | Discharge status: Home / Self Care | Payer: Medicaid Other | Attending: Emergency Medicine | Admitting: Emergency Medicine

## 2021-06-28 DIAGNOSIS — I509 Heart failure, unspecified: Secondary | ICD-10-CM | POA: Diagnosis not present

## 2021-06-28 DIAGNOSIS — R739 Hyperglycemia, unspecified: Secondary | ICD-10-CM

## 2021-06-28 DIAGNOSIS — R0682 Tachypnea, not elsewhere classified: Secondary | ICD-10-CM | POA: Insufficient documentation

## 2021-06-28 DIAGNOSIS — N764 Abscess of vulva: Secondary | ICD-10-CM | POA: Insufficient documentation

## 2021-06-28 DIAGNOSIS — R112 Nausea with vomiting, unspecified: Secondary | ICD-10-CM | POA: Insufficient documentation

## 2021-06-28 DIAGNOSIS — R000 Tachycardia, unspecified: Secondary | ICD-10-CM | POA: Insufficient documentation

## 2021-06-28 DIAGNOSIS — R109 Unspecified abdominal pain: Secondary | ICD-10-CM | POA: Diagnosis present

## 2021-06-28 DIAGNOSIS — R079 Chest pain, unspecified: Secondary | ICD-10-CM | POA: Insufficient documentation

## 2021-06-28 DIAGNOSIS — E1165 Type 2 diabetes mellitus with hyperglycemia: Secondary | ICD-10-CM | POA: Insufficient documentation

## 2021-06-28 DIAGNOSIS — R1084 Generalized abdominal pain: Secondary | ICD-10-CM | POA: Insufficient documentation

## 2021-06-28 LAB — BLOOD GAS, VENOUS
Acid-Base Excess: 4.2 mmol/L — ABNORMAL HIGH (ref 0.0–2.0)
Bicarbonate: 28.4 mmol/L — ABNORMAL HIGH (ref 20.0–28.0)
O2 Saturation: 91.4 %
Patient temperature: 37
pCO2, Ven: 40 mmHg — ABNORMAL LOW (ref 44–60)
pH, Ven: 7.46 — ABNORMAL HIGH (ref 7.25–7.43)
pO2, Ven: 61 mmHg — ABNORMAL HIGH (ref 32–45)

## 2021-06-28 LAB — COMPREHENSIVE METABOLIC PANEL
ALT: 40 U/L (ref 0–44)
AST: 48 U/L — ABNORMAL HIGH (ref 15–41)
Albumin: 3.5 g/dL (ref 3.5–5.0)
Alkaline Phosphatase: 71 U/L (ref 38–126)
Anion gap: 9 (ref 5–15)
BUN: 8 mg/dL (ref 6–20)
CO2: 28 mmol/L (ref 22–32)
Calcium: 9.8 mg/dL (ref 8.9–10.3)
Chloride: 98 mmol/L (ref 98–111)
Creatinine, Ser: 0.4 mg/dL — ABNORMAL LOW (ref 0.44–1.00)
GFR, Estimated: >60 mL/min (ref >60)
Glucose, Bld: 389 mg/dL — ABNORMAL HIGH (ref 70–99)
Potassium: 3.6 mmol/L (ref 3.5–5.1)
Sodium: 135 mmol/L (ref 135–145)
Total Bilirubin: 1 mg/dL (ref 0.3–1.2)
Total Protein: 6.7 g/dL (ref 6.5–8.1)

## 2021-06-28 LAB — CBC
HCT: 32.3 % — ABNORMAL LOW (ref 36.0–46.0)
Hemoglobin: 10.4 g/dL — ABNORMAL LOW (ref 12.0–15.0)
MCH: 24.3 pg — ABNORMAL LOW (ref 26.0–34.0)
MCHC: 32.2 g/dL (ref 30.0–36.0)
MCV: 75.5 fL — ABNORMAL LOW (ref 80.0–100.0)
Platelets: 239 10*3/uL (ref 150–400)
RBC: 4.28 MIL/uL (ref 3.87–5.11)
RDW: 13.2 % (ref 11.5–15.5)
WBC: 4.6 10*3/uL (ref 4.0–10.5)
nRBC: 0 % (ref 0.0–0.2)

## 2021-06-28 LAB — URINALYSIS, COMPLETE (UACMP) WITH MICROSCOPIC
Bilirubin Urine: NEGATIVE
Glucose, UA: >=500 mg/dL — AB
Hgb urine dipstick: NEGATIVE
Ketones, ur: NEGATIVE mg/dL
Leukocytes,Ua: NEGATIVE
Nitrite: NEGATIVE
Protein, ur: NEGATIVE mg/dL
Specific Gravity, Urine: 1.027 (ref 1.005–1.030)
pH: 8 (ref 5.0–8.0)

## 2021-06-28 LAB — CBG MONITORING, ED: Glucose-Capillary: 187 mg/dL — ABNORMAL HIGH (ref 70–99)

## 2021-06-28 LAB — TROPONIN I (HIGH SENSITIVITY)
Troponin I (High Sensitivity): 4 ng/L (ref <18)
Troponin I (High Sensitivity): 5 ng/L (ref <18)

## 2021-06-28 MED ORDER — INSULIN ASPART 100 UNIT/ML IJ SOLN
10.0000 [IU] | Freq: Once | INTRAMUSCULAR | Status: AC
  Administered 2021-06-28: 10 [IU] via INTRAVENOUS
  Filled 2021-06-28: qty 1

## 2021-06-28 MED ORDER — ONDANSETRON HCL 4 MG/2ML IJ SOLN
4.0000 mg | Freq: Once | INTRAMUSCULAR | Status: AC
  Administered 2021-06-28: 4 mg via INTRAVENOUS
  Filled 2021-06-28: qty 2

## 2021-06-28 MED ORDER — METOCLOPRAMIDE HCL 5 MG/ML IJ SOLN
10.0000 mg | Freq: Once | INTRAMUSCULAR | Status: AC
  Administered 2021-06-28: 10 mg via INTRAVENOUS
  Filled 2021-06-28: qty 2

## 2021-06-28 MED ORDER — MORPHINE SULFATE (PF) 4 MG/ML IV SOLN
4.0000 mg | Freq: Once | INTRAVENOUS | Status: AC
  Administered 2021-06-28: 4 mg via INTRAVENOUS
  Filled 2021-06-28: qty 1

## 2021-06-28 MED ORDER — SODIUM CHLORIDE 0.9 % IV BOLUS
1000.0000 mL | Freq: Once | INTRAVENOUS | Status: AC
  Administered 2021-06-28: 1000 mL via INTRAVENOUS

## 2021-06-28 MED ORDER — LIDOCAINE HCL (PF) 1 % IJ SOLN
5.0000 mL | Freq: Once | INTRAMUSCULAR | Status: AC
  Administered 2021-06-28: 5 mL
  Filled 2021-06-28: qty 5

## 2021-06-28 MED ORDER — IOHEXOL 350 MG/ML SOLN
75.0000 mL | Freq: Once | INTRAVENOUS | Status: AC | PRN
  Administered 2021-06-28: 75 mL via INTRAVENOUS

## 2021-06-28 MED ORDER — DROPERIDOL 2.5 MG/ML IJ SOLN
2.5000 mg | Freq: Once | INTRAMUSCULAR | Status: AC
  Administered 2021-06-28: 2.5 mg via INTRAVENOUS
  Filled 2021-06-28: qty 2

## 2021-06-28 NOTE — ED Notes (Signed)
Pt denies SOB and chest pain at this time.  

## 2021-06-30 ENCOUNTER — Emergency Department
Admission: EM | Admit: 2021-06-30 | Discharge: 2021-06-30 | Disposition: A | Discharge status: Home / Self Care | Payer: Medicaid Other | Attending: Emergency Medicine | Admitting: Emergency Medicine

## 2021-06-30 ENCOUNTER — Other Ambulatory Visit: Payer: Self-pay

## 2021-06-30 DIAGNOSIS — R109 Unspecified abdominal pain: Secondary | ICD-10-CM | POA: Insufficient documentation

## 2021-06-30 DIAGNOSIS — R112 Nausea with vomiting, unspecified: Secondary | ICD-10-CM | POA: Diagnosis present

## 2021-06-30 LAB — CBC
HCT: 34.2 % — ABNORMAL LOW (ref 36.0–46.0)
Hemoglobin: 10.9 g/dL — ABNORMAL LOW (ref 12.0–15.0)
MCH: 24 pg — ABNORMAL LOW (ref 26.0–34.0)
MCHC: 31.9 g/dL (ref 30.0–36.0)
MCV: 75.2 fL — ABNORMAL LOW (ref 80.0–100.0)
Platelets: 239 10*3/uL (ref 150–400)
RBC: 4.55 MIL/uL (ref 3.87–5.11)
RDW: 13.1 % (ref 11.5–15.5)
WBC: 4.1 10*3/uL (ref 4.0–10.5)
nRBC: 0 % (ref 0.0–0.2)

## 2021-06-30 LAB — COMPREHENSIVE METABOLIC PANEL
ALT: 39 U/L (ref 0–44)
AST: 35 U/L (ref 15–41)
Albumin: 3.7 g/dL (ref 3.5–5.0)
Alkaline Phosphatase: 73 U/L (ref 38–126)
Anion gap: 8 (ref 5–15)
BUN: <5 mg/dL — ABNORMAL LOW (ref 6–20)
CO2: 27 mmol/L (ref 22–32)
Calcium: 10 mg/dL (ref 8.9–10.3)
Chloride: 102 mmol/L (ref 98–111)
Creatinine, Ser: 0.37 mg/dL — ABNORMAL LOW (ref 0.44–1.00)
GFR, Estimated: >60 mL/min (ref >60)
Glucose, Bld: 346 mg/dL — ABNORMAL HIGH (ref 70–99)
Potassium: 3.6 mmol/L (ref 3.5–5.1)
Sodium: 137 mmol/L (ref 135–145)
Total Bilirubin: 0.7 mg/dL (ref 0.3–1.2)
Total Protein: 7 g/dL (ref 6.5–8.1)

## 2021-06-30 LAB — LIPASE, BLOOD: Lipase: 30 U/L (ref 11–51)

## 2021-06-30 MED ORDER — PROMETHAZINE HCL 25 MG RE SUPP: 25.0000 mg | Freq: Three times a day (TID) | RECTAL | 1 refills | Status: DC | PRN

## 2021-06-30 MED ORDER — PROMETHAZINE HCL 25 MG RE SUPP
25.0000 mg | Freq: Once | RECTAL | Status: AC
Start: 2021-06-30 — End: 2021-06-30
  Administered 2021-06-30: 25 mg via RECTAL
  Filled 2021-06-30: qty 1

## 2021-07-01 ENCOUNTER — Telehealth: Payer: Self-pay

## 2021-07-01 NOTE — Telephone Encounter (Signed)
Armbruster, Willaim Rayas, MD  Missy Sabins, RN Can you help coordinate a follow up visit in the clinic? Thanks

## 2021-07-12 ENCOUNTER — Emergency Department
Admission: EM | Admit: 2021-07-12 | Discharge: 2021-07-12 | Disposition: A | Discharge status: Home / Self Care | Payer: Medicaid Other | Attending: Emergency Medicine | Admitting: Emergency Medicine

## 2021-07-12 DIAGNOSIS — R109 Unspecified abdominal pain: Secondary | ICD-10-CM | POA: Diagnosis not present

## 2021-07-12 DIAGNOSIS — R569 Unspecified convulsions: Secondary | ICD-10-CM

## 2021-07-12 DIAGNOSIS — G8929 Other chronic pain: Secondary | ICD-10-CM | POA: Insufficient documentation

## 2021-07-12 DIAGNOSIS — G40909 Epilepsy, unspecified, not intractable, without status epilepticus: Secondary | ICD-10-CM | POA: Diagnosis not present

## 2021-07-12 DIAGNOSIS — F449 Dissociative and conversion disorder, unspecified: Secondary | ICD-10-CM | POA: Insufficient documentation

## 2021-07-12 LAB — CBC WITH DIFFERENTIAL/PLATELET
Abs Immature Granulocytes: 0.02 10*3/uL (ref 0.00–0.07)
Basophils Absolute: 0 10*3/uL (ref 0.0–0.1)
Basophils Relative: 0 %
Eosinophils Absolute: 0.1 10*3/uL (ref 0.0–0.5)
Eosinophils Relative: 1 %
HCT: 30.6 % — ABNORMAL LOW (ref 36.0–46.0)
Hemoglobin: 9.7 g/dL — ABNORMAL LOW (ref 12.0–15.0)
Immature Granulocytes: 0 %
Lymphocytes Relative: 28 %
Lymphs Abs: 2.5 10*3/uL (ref 0.7–4.0)
MCH: 23.7 pg — ABNORMAL LOW (ref 26.0–34.0)
MCHC: 31.7 g/dL (ref 30.0–36.0)
MCV: 74.8 fL — ABNORMAL LOW (ref 80.0–100.0)
Monocytes Absolute: 1.4 10*3/uL — ABNORMAL HIGH (ref 0.1–1.0)
Monocytes Relative: 16 %
Neutro Abs: 4.9 10*3/uL (ref 1.7–7.7)
Neutrophils Relative %: 55 %
Platelets: 267 10*3/uL (ref 150–400)
RBC: 4.09 MIL/uL (ref 3.87–5.11)
RDW: 13.3 % (ref 11.5–15.5)
WBC: 8.9 10*3/uL (ref 4.0–10.5)
nRBC: 0 % (ref 0.0–0.2)

## 2021-07-12 LAB — COMPREHENSIVE METABOLIC PANEL
ALT: 53 U/L — ABNORMAL HIGH (ref 0–44)
AST: 64 U/L — ABNORMAL HIGH (ref 15–41)
Albumin: 3.4 g/dL — ABNORMAL LOW (ref 3.5–5.0)
Alkaline Phosphatase: 80 U/L (ref 38–126)
Anion gap: 10 (ref 5–15)
BUN: 11 mg/dL (ref 6–20)
CO2: 30 mmol/L (ref 22–32)
Calcium: 9.7 mg/dL (ref 8.9–10.3)
Chloride: 99 mmol/L (ref 98–111)
Creatinine, Ser: <0.3 mg/dL — ABNORMAL LOW (ref 0.44–1.00)
Glucose, Bld: 216 mg/dL — ABNORMAL HIGH (ref 70–99)
Potassium: 3 mmol/L — ABNORMAL LOW (ref 3.5–5.1)
Sodium: 139 mmol/L (ref 135–145)
Total Bilirubin: 1 mg/dL (ref 0.3–1.2)
Total Protein: 6.4 g/dL — ABNORMAL LOW (ref 6.5–8.1)

## 2021-07-12 LAB — LIPASE, BLOOD: Lipase: 40 U/L (ref 11–51)

## 2021-07-12 LAB — HCG, QUANTITATIVE, PREGNANCY: hCG, Beta Chain, Quant, S: <1 m[IU]/mL (ref <5)

## 2021-07-12 MED ORDER — PANTOPRAZOLE SODIUM 40 MG IV SOLR
40.0000 mg | Freq: Once | INTRAVENOUS | Status: AC
  Administered 2021-07-12: 40 mg via INTRAVENOUS
  Filled 2021-07-12: qty 10

## 2021-07-12 MED ORDER — ALUM & MAG HYDROXIDE-SIMETH 200-200-20 MG/5ML PO SUSP
30.0000 mL | Freq: Once | ORAL | Status: AC
  Administered 2021-07-12: 30 mL via ORAL
  Filled 2021-07-12: qty 30

## 2021-07-12 MED ORDER — KETOROLAC TROMETHAMINE 15 MG/ML IJ SOLN
15.0000 mg | Freq: Once | INTRAMUSCULAR | Status: AC
  Administered 2021-07-12: 15 mg via INTRAVENOUS
  Filled 2021-07-12: qty 1

## 2021-07-12 MED ORDER — SODIUM CHLORIDE 0.9 % IV BOLUS
1000.0000 mL | Freq: Once | INTRAVENOUS | Status: AC
  Administered 2021-07-12: 1000 mL via INTRAVENOUS

## 2021-07-12 MED ORDER — ONDANSETRON 4 MG PO TBDP: 4.0000 mg | ORAL_TABLET | Freq: Three times a day (TID) | ORAL | 0 refills | Status: DC | PRN

## 2021-07-12 MED ORDER — LORAZEPAM 2 MG/ML IJ SOLN
INTRAMUSCULAR | Status: AC
  Administered 2021-07-12: 2 mg
  Filled 2021-07-12: qty 1

## 2021-07-12 MED ORDER — ONDANSETRON HCL 4 MG/2ML IJ SOLN
4.0000 mg | Freq: Once | INTRAMUSCULAR | Status: AC
  Administered 2021-07-12: 4 mg via INTRAVENOUS
  Filled 2021-07-12: qty 2

## 2021-07-12 MED ORDER — FAMOTIDINE 20 MG PO TABS: 20.0000 mg | ORAL_TABLET | Freq: Two times a day (BID) | ORAL | 0 refills | Status: DC

## 2021-07-12 MED ORDER — LEVETIRACETAM IN NACL 1500 MG/100ML IV SOLN
1500.0000 mg | INTRAVENOUS | Status: AC
  Administered 2021-07-12: 1500 mg via INTRAVENOUS
  Filled 2021-07-12: qty 100

## 2021-07-12 NOTE — ED Triage Notes (Addendum)
Patient from home with witnessed seizure per pt's mother. Pt's mom endorses history of seizures but patient does not take any medications for seizures. Patient had additional tonic clonic seizure with EMS lasting approximately 30 seconds. No meds given en route. Patient drowsy on arrival but answering questions.   CBG 221 162/89 ET 49 24 RR 126 HR 100% room air

## 2021-07-12 NOTE — ED Provider Notes (Signed)
[facility] Provider Note    Event Date/Time   First MD Initiated Contact with Patient 07/12/21 0840     (approximate)   History   Chief Complaint: Seizures   HPI  Nancy [name] is a 21 y.o. female with a history of seizure disorder who is brought to the ED by EMS due to a witnessed seizure at home while lying on the couch.  Patient's mom reported to EMS that patient is not compliant with her medications.  EMS reports an additional seizure during transport with postictal phase afterward.  No falls or trauma, no head injury.  No fever.        Physical Exam   Triage Vital Signs: ED Triage Vitals  Enc Vitals Group     BP 07/12/21 0839 (!) 155/94     Pulse Rate 07/12/21 0839 (!) 130     Resp 07/12/21 0839 (!) 22     Temp 07/12/21 0839 99.3 F (37.4 C)     Temp Source 07/12/21 0839 Oral     SpO2 07/12/21 0839 100 %     Weight 07/12/21 0840 168 lb 10.4 oz (76.5 kg)     Height 07/12/21 0840 5\' 3"  (1.6 m)     Head Circumference --      Peak Flow --      Pain Score --      Pain Loc --      Pain Edu? --      Excl. in GC? --     Most recent vital signs: Vitals:   07/12/21 0957 07/12/21 1030  BP:  (!) 153/93  Pulse: (!) 133 (!) 126  Resp: (!) 25 20  Temp:    SpO2: 98% 97%    General: Awake, no distress.  Following commands CV:  Good peripheral perfusion.  Tachycardia heart rate 130 Resp:  Normal effort.  Clear to auscultation bilaterally Abd:  No distention.  Diffuse tenderness.  Soft, no rigidity Other:  No intraoral injury.  All teeth have been previously removed.  No tongue laceration.  No other outward signs of trauma.   ED Results / Procedures / Treatments   Labs (all labs ordered are listed, but only abnormal results are displayed) Labs Reviewed  COMPREHENSIVE METABOLIC PANEL - Abnormal; Notable for the following components:      Result Value   Potassium 3.0 (*)    Glucose, Bld 216 (*)    Creatinine, Ser <0.30 (*)    Total  Protein 6.4 (*)    Albumin 3.4 (*)    AST 64 (*)    ALT 53 (*)    All other components within normal limits  CBC WITH DIFFERENTIAL/PLATELET - Abnormal; Notable for the following components:   Hemoglobin 9.7 (*)    HCT 30.6 (*)    MCV 74.8 (*)    MCH 23.7 (*)    Monocytes Absolute 1.4 (*)    All other components within normal limits  LIPASE, BLOOD  HCG, QUANTITATIVE, PREGNANCY  URINALYSIS, ROUTINE W REFLEX MICROSCOPIC  URINE DRUG SCREEN, QUALITATIVE ([facility] ONLY)     EKG Interpreted by me Sinus tachycardia rate 129.  Normal axis and intervals.  Normal QRS ST segments and T waves.   RADIOLOGY CT abdomen pelvis from June 28, 2021 interpreted by me, unremarkable.  Radiology report reviewed.   PROCEDURES:  .1-3 Lead EKG Interpretation  Performed by: June 30, 2021, MD Authorized by: Sharman Cheek, MD     Interpretation: abnormal     ECG  rate:  130   ECG rate assessment: tachycardic     Rhythm: sinus tachycardia     Ectopy: none     Conduction: normal   Comments:        .Critical Care  Performed by: Sharman Cheek, MD Authorized by: Sharman Cheek, MD   Critical care provider statement:    Critical care time (minutes):  35   Critical care time was exclusive of:  Separately billable procedures and treating other patients   Critical care was necessary to treat or prevent imminent or life-threatening deterioration of the following conditions:  CNS failure or compromise   Critical care was time spent personally by me on the following activities:  Development of treatment plan with patient or surrogate, discussions with consultants, evaluation of patient's response to treatment, examination of patient, obtaining history from patient or surrogate, ordering and performing treatments and interventions, ordering and review of laboratory studies, ordering and review of radiographic studies, pulse oximetry, re-evaluation of patient's condition and review of old charts    Care discussed with: admitting provider      MEDICATIONS ORDERED IN ED: Medications  sodium chloride 0.9 % bolus 1,000 mL (0 mLs Intravenous Stopped 07/12/21 1025)  levETIRAcetam (KEPPRA) IVPB 1500 mg/ 100 mL premix (0 mg Intravenous Stopped 07/12/21 0922)  LORazepam (ATIVAN) 2 MG/ML injection (2 mg  Given by Other 07/12/21 0852)  ondansetron (ZOFRAN) injection 4 mg (4 mg Intravenous Given 07/12/21 0915)  ketorolac (TORADOL) 15 MG/ML injection 15 mg (15 mg Intravenous Given 07/12/21 1026)  pantoprazole (PROTONIX) injection 40 mg (40 mg Intravenous Given 07/12/21 1026)  alum & mag hydroxide-simeth (MAALOX/MYLANTA) 200-200-20 MG/5ML suspension 30 mL (30 mLs Oral Given 07/12/21 1026)     IMPRESSION / MDM / ASSESSMENT AND PLAN / ED COURSE  I reviewed the triage vital signs and the nursing notes.                              Differential diagnosis includes, but is not limited to, decompensated seizure disorder, UTI, pregnancy, biliary disease, pancreatitis, recreational drug toxidrome, alcohol withdrawal  Patient's presentation is most consistent with acute presentation with potential threat to life or bodily function.  Patient presents with seizure, tachycardia, representing generalized abdominal pain.  Will obtain labs, CT abdomen pelvis.  In the ED patient had an additional episode of seizure like activity, was given 2 mg IV Ativan.  She was completely lucid immediately after, not postictal.  No urinary incontinence.  Will load with Keppra as well.   ----------------------------------------- 10:03 AM on 07/12/2021 -----------------------------------------  EMR chart merge reveals patient's past history includes polysubstance abuse, conversion disorder, pseudoseizures, chronic abdominal pain.  She has frequent presentations for abdominal pain nausea vomiting.  She had a CT scan of the abdomen and pelvis 2 weeks ago which was unremarkable.  Given this history, this appears to be a recurrent  presentation of her chronic symptoms.  She is on Lamictal and Topamax as mood stabilizers.  We will give Toradol Protonix and Maalox for symptom relief.  Labs are unremarkable, and I think patient does not require hospitalization if symptoms are improving.   ----------------------------------------- 11:08 AM on 07/12/2021 ----------------------------------------- Patient is asking for food, tolerating oral intake.  Mental status is normal.  Stable for discharge      FINAL CLINICAL IMPRESSION(S) / ED DIAGNOSES   Final diagnoses:  Chronic abdominal pain  Conversion disorder  Seizure-like activity (HCC)     Rx /  DC Orders   ED Discharge Orders          Ordered    famotidine (PEPCID) 20 MG tablet  2 times daily        07/12/21 1108    ondansetron (ZOFRAN-ODT) 4 MG disintegrating tablet  Every 8 hours PRN        07/12/21 1108             Note:  This document was prepared using Dragon voice recognition software and may include unintentional dictation errors.   Sharman Cheek, MD 07/12/21 1110

## 2021-07-12 NOTE — ED Notes (Signed)
Patient had additional witnessed tonic clonic seizure  lasting approximately 1-2 minutes. Dr. Scotty Court notified and 2mg  IV ativan administered at this time. Patient in NAD at this time and airway patent.

## 2021-07-12 NOTE — Discharge Instructions (Signed)
Your lab tests today were all okay.  Continue taking all of your medications at home and follow-up with your primary care doctor.

## 2021-07-15 ENCOUNTER — Other Ambulatory Visit: Payer: Self-pay

## 2021-07-15 ENCOUNTER — Emergency Department
Admission: EM | Admit: 2021-07-15 | Discharge: 2021-07-16 | Disposition: A | Discharge status: Home / Self Care | Payer: Medicaid Other | Attending: Emergency Medicine | Admitting: Emergency Medicine

## 2021-07-15 DIAGNOSIS — R112 Nausea with vomiting, unspecified: Secondary | ICD-10-CM | POA: Diagnosis not present

## 2021-07-15 DIAGNOSIS — R739 Hyperglycemia, unspecified: Secondary | ICD-10-CM | POA: Diagnosis not present

## 2021-07-15 DIAGNOSIS — F12188 Cannabis abuse with other cannabis-induced disorder: Secondary | ICD-10-CM | POA: Diagnosis not present

## 2021-07-15 DIAGNOSIS — R569 Unspecified convulsions: Secondary | ICD-10-CM | POA: Diagnosis not present

## 2021-07-15 NOTE — ED Triage Notes (Signed)
VIA  [facility] 2mg  versed 4 mg zofran, IV 8 seizures earlier, and 2 with EMS,family on scene per ems, 20g LAC, 191/107, last one 114/87, cbg 202; 97 on RA, ST at 137

## 2021-07-15 NOTE — ED Notes (Signed)
Md in room

## 2021-07-16 ENCOUNTER — Emergency Department: Payer: Medicaid Other

## 2021-07-16 LAB — POC URINE PREG, ED: Preg Test, Ur: NEGATIVE

## 2021-07-16 LAB — URINE DRUG SCREEN, QUALITATIVE (ARMC ONLY)
Amphetamines, Ur Screen: NOT DETECTED
Barbiturates, Ur Screen: NOT DETECTED
Benzodiazepine, Ur Scrn: POSITIVE — AB
Cannabinoid 50 Ng, Ur ~~LOC~~: POSITIVE — AB
Cocaine Metabolite,Ur ~~LOC~~: NOT DETECTED
MDMA (Ecstasy)Ur Screen: NOT DETECTED
Methadone Scn, Ur: NOT DETECTED
Opiate, Ur Screen: NOT DETECTED
Phencyclidine (PCP) Ur S: NOT DETECTED
Tricyclic, Ur Screen: NOT DETECTED

## 2021-07-16 LAB — CBC WITH DIFFERENTIAL/PLATELET
Abs Immature Granulocytes: 0.01 10*3/uL (ref 0.00–0.07)
Basophils Absolute: 0 10*3/uL (ref 0.0–0.1)
Basophils Relative: 0 %
Eosinophils Absolute: 0.1 10*3/uL (ref 0.0–0.5)
Eosinophils Relative: 1 %
HCT: 28.4 % — ABNORMAL LOW (ref 36.0–46.0)
Hemoglobin: 9 g/dL — ABNORMAL LOW (ref 12.0–15.0)
Immature Granulocytes: 0 %
Lymphocytes Relative: 30 %
Lymphs Abs: 2.1 10*3/uL (ref 0.7–4.0)
MCH: 24 pg — ABNORMAL LOW (ref 26.0–34.0)
MCHC: 31.7 g/dL (ref 30.0–36.0)
MCV: 75.7 fL — ABNORMAL LOW (ref 80.0–100.0)
Monocytes Absolute: 0.9 10*3/uL (ref 0.1–1.0)
Monocytes Relative: 13 %
Neutro Abs: 3.7 10*3/uL (ref 1.7–7.7)
Neutrophils Relative %: 56 %
Platelets: 236 10*3/uL (ref 150–400)
RBC: 3.75 MIL/uL — ABNORMAL LOW (ref 3.87–5.11)
RDW: 13.7 % (ref 11.5–15.5)
WBC: 6.8 10*3/uL (ref 4.0–10.5)
nRBC: 0 % (ref 0.0–0.2)

## 2021-07-16 LAB — URINALYSIS, ROUTINE W REFLEX MICROSCOPIC
Bilirubin Urine: NEGATIVE
Glucose, UA: 50 mg/dL — AB
Hgb urine dipstick: NEGATIVE
Ketones, ur: 20 mg/dL — AB
Leukocytes,Ua: NEGATIVE
Nitrite: NEGATIVE
Protein, ur: 30 mg/dL — AB
Specific Gravity, Urine: 1.014 (ref 1.005–1.030)
pH: 8 (ref 5.0–8.0)

## 2021-07-16 LAB — COMPREHENSIVE METABOLIC PANEL
ALT: 27 U/L (ref 0–44)
AST: 29 U/L (ref 15–41)
Albumin: 3.3 g/dL — ABNORMAL LOW (ref 3.5–5.0)
Alkaline Phosphatase: 66 U/L (ref 38–126)
Anion gap: 9 (ref 5–15)
BUN: 11 mg/dL (ref 6–20)
CO2: 28 mmol/L (ref 22–32)
Calcium: 9.9 mg/dL (ref 8.9–10.3)
Chloride: 101 mmol/L (ref 98–111)
Creatinine, Ser: 0.38 mg/dL — ABNORMAL LOW (ref 0.44–1.00)
GFR, Estimated: >60 mL/min (ref >60)
Glucose, Bld: 172 mg/dL — ABNORMAL HIGH (ref 70–99)
Potassium: 3.7 mmol/L (ref 3.5–5.1)
Sodium: 138 mmol/L (ref 135–145)
Total Bilirubin: 1.3 mg/dL — ABNORMAL HIGH (ref 0.3–1.2)
Total Protein: 6.1 g/dL — ABNORMAL LOW (ref 6.5–8.1)

## 2021-07-16 LAB — MAGNESIUM: Magnesium: 1.3 mg/dL — ABNORMAL LOW (ref 1.7–2.4)

## 2021-07-16 LAB — LIPASE, BLOOD: Lipase: 34 U/L (ref 11–51)

## 2021-07-16 MED ORDER — IOHEXOL 300 MG/ML  SOLN
100.0000 mL | Freq: Once | INTRAMUSCULAR | Status: AC | PRN
  Administered 2021-07-16: 100 mL via INTRAVENOUS

## 2021-07-16 MED ORDER — ONDANSETRON 4 MG PO TBDP: 4.0000 mg | ORAL_TABLET | Freq: Three times a day (TID) | ORAL | 0 refills | Status: DC | PRN

## 2021-07-16 MED ORDER — MAGNESIUM SULFATE 2 GM/50ML IV SOLN
2.0000 g | Freq: Once | INTRAVENOUS | Status: AC
  Administered 2021-07-16: 2 g via INTRAVENOUS
  Filled 2021-07-16: qty 50

## 2021-07-16 MED ORDER — DROPERIDOL 2.5 MG/ML IJ SOLN
2.5000 mg | Freq: Once | INTRAMUSCULAR | Status: AC
  Administered 2021-07-16: 2.5 mg via INTRAVENOUS
  Filled 2021-07-16: qty 2

## 2021-07-16 MED ORDER — LACTATED RINGERS IV BOLUS
1000.0000 mL | Freq: Once | INTRAVENOUS | Status: AC
  Administered 2021-07-16: 1000 mL via INTRAVENOUS

## 2021-07-16 NOTE — ED Notes (Signed)
Patient asked if she could give a urine specimen at this time the pt stated no, this writer stated if they could not give one after these fluids we may have to in and out cath. Pt agreed at this time with that

## 2021-07-16 NOTE — ED Notes (Signed)
Pt given cracker and water at this time; pt called mother to get a ride home

## 2021-07-16 NOTE — ED Provider Notes (Signed)
[facility] Provider Note    Event Date/Time   First MD Initiated Contact with Patient 07/16/21 0002     (approximate)   History   Seizures   HPI  Nancy [name] is a 21 y.o. female who presents to the ED for evaluation of Seizures   I reviewed recurrent ED visits, 15 in the past 6 months, often for nausea and vomiting associated with cannabis use, hyperglycemia without acidosis.  Has had seizure activities in the past and patient is prescribed Lamictal.  Thought to be pseudoseizure.  Has had admissions for DKA in the past. When reviewing previous ED visits, patient is almost always tachycardic in the 120s/130s.  Patient presents to the ED for the ration of seizure-like activity in the setting of a few days of nausea and vomiting.  She reports she has been sick for few days now with recurrent emesis and aching all over.  Denies any fevers, syncope or falls.  Reports having "a few" seizures today despite compliance with her Lamictal.  No chest pain or headache.  Physical Exam   Triage Vital Signs: ED Triage Vitals  Enc Vitals Group     BP 07/15/21 2357 (!) 186/101     Pulse Rate 07/15/21 2357 (!) 136     Resp 07/15/21 2357 (!) 21     Temp 07/15/21 2351 98.8 F (37.1 C)     Temp Source 07/15/21 2351 Oral     SpO2 07/15/21 2349 96 %     Weight 07/15/21 2352 165 lb 5.5 oz (75 kg)     Height 07/15/21 2352 5\' 3"  (1.6 m)     Head Circumference --      Peak Flow --      Pain Score --      Pain Loc --      Pain Edu? --      Excl. in GC? --     Most recent vital signs: Vitals:   07/16/21 0215 07/16/21 0317  BP: (!) 140/96 139/88  Pulse: (!) 134 (!) 122  Resp: (!) 27 (!) 21  Temp:    SpO2: 100% 100%    General: Awake.  Lying on her side and appears uncomfortable.  Follows commands in all 4 extremities.  No seizure activity. CV:  Good peripheral perfusion.  Tachycardic and regular Resp:  Normal effort.  Abd:  No distention.  Diffuse and mild  tenderness without peritoneal or localizing features. MSK:  No deformity noted.  Neuro:  No focal deficits appreciated. Cranial nerves II through XII intact 5/5 strength and sensation in all 4 extremities Other:     ED Results / Procedures / Treatments   Labs (all labs ordered are listed, but only abnormal results are displayed) Labs Reviewed  COMPREHENSIVE METABOLIC PANEL - Abnormal; Notable for the following components:      Result Value   Glucose, Bld 172 (*)    Creatinine, Ser 0.38 (*)    Total Protein 6.1 (*)    Albumin 3.3 (*)    Total Bilirubin 1.3 (*)    All other components within normal limits  CBC WITH DIFFERENTIAL/PLATELET - Abnormal; Notable for the following components:   RBC 3.75 (*)    Hemoglobin 9.0 (*)    HCT 28.4 (*)    MCV 75.7 (*)    MCH 24.0 (*)    All other components within normal limits  MAGNESIUM - Abnormal; Notable for the following components:   Magnesium 1.3 (*)  All other components within normal limits  URINE DRUG SCREEN, QUALITATIVE ([facility] ONLY) - Abnormal; Notable for the following components:   Cannabinoid 50 Ng, Ur [place] POSITIVE (*)    Benzodiazepine, Ur Scrn POSITIVE (*)    All other components within normal limits  LIPASE, BLOOD  URINALYSIS, ROUTINE W REFLEX MICROSCOPIC  POC URINE PREG, ED    EKG Sinus tachycardia with a rate of 137 bpm.  Normal axis and intervals.  No clear signs of acute ischemia.  RADIOLOGY CT abdomen/pelvis interpreted by me without evidence of acute intra-abdominal pathology.  Official radiology report(s): CT ABDOMEN PELVIS W CONTRAST  Result Date: 07/16/2021 CLINICAL DATA:  Generalized abdominal pain, vomiting EXAM: CT ABDOMEN AND PELVIS WITH CONTRAST TECHNIQUE: Multidetector CT imaging of the abdomen and pelvis was performed using the standard protocol following bolus administration of intravenous contrast. RADIATION DOSE REDUCTION: This exam was performed according to the departmental dose-optimization  program which includes automated exposure control, adjustment of the mA and/or kV according to patient size and/or use of iterative reconstruction technique. CONTRAST:  OMNIPAQUE IOHEXOL 300 MG/ML  SOLN COMPARISON:  06/28/2021 FINDINGS: Lower chest: No acute abnormality Hepatobiliary: No focal hepatic abnormality. Gallbladder unremarkable. Pancreas: No focal abnormality or ductal dilatation. Spleen: No focal abnormality.  Normal size. Adrenals/Urinary Tract: Small nodule in the left adrenal gland measures 13 mm, nonspecific. Right adrenal gland unremarkable. No renal or ureteral stones. No hydronephrosis. Urinary bladder grossly unremarkable. Stomach/Bowel: Normal appendix. Stomach, large and small bowel grossly unremarkable. Vascular/Lymphatic: No evidence of aneurysm or adenopathy. Reproductive: Uterus and adnexa unremarkable.  No mass. Other: No free fluid or free air. Previously seen area of increased density in the left labia has resolved. Musculoskeletal: No acute bony abnormality. IMPRESSION: No acute findings in the abdomen or pelvis. Small left adrenal nodule, nonspecific, most likely adenoma. Electronically Signed   By: Charlett Nose M.D.   On: 07/16/2021 02:17    PROCEDURES and INTERVENTIONS:  .1-3 Lead EKG Interpretation  Performed by: Delton Prairie, MD Authorized by: Delton Prairie, MD     Interpretation: abnormal     ECG rate:  120   ECG rate assessment: tachycardic     Rhythm: sinus tachycardia     Ectopy: none     Conduction: normal     Medications  lactated ringers bolus 1,000 mL (0 mLs Intravenous Stopped 07/16/21 0103)  droperidol (INAPSINE) 2.5 MG/ML injection 2.5 mg (2.5 mg Intravenous Given 07/16/21 0011)  lactated ringers bolus 1,000 mL (0 mLs Intravenous Stopped 07/16/21 0304)  magnesium sulfate IVPB 2 g 50 mL (0 g Intravenous Stopped 07/16/21 0304)  iohexol (OMNIPAQUE) 300 MG/ML solution 100 mL (100 mLs Intravenous Contrast Given 07/16/21 0159)     IMPRESSION / MDM /  ASSESSMENT AND PLAN / ED COURSE  I reviewed the triage vital signs and the nursing notes.  Differential diagnosis includes, but is not limited to, dehydration, DKA, cannabis hyperemesis syndrome, symptomatic anemia, SBO, appendicitis  {Patient presents with symptoms of an acute illness or injury that is potentially life-threatening.  21 year old woman presents to the ED with recurrent emesis and tachycardia, possibly due to regular cannabis use and ultimately suitable for outpatient management.  She presents after having a reported seizure at home, though no seizure activities witnessed here in the ED.  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  Blood work with chronic microcytic anemia.  Minimal hyperglycemia without acidosis or signs of DKA or HHS.  Negative lipase.  CT obtained due to her persistent tachycardia and  without evidence of acute intra-abdominal pathology.  Patient demanding to leave prior to the results of her UA, acknowledging risks of undiagnosed pathology.  Clinical Course as of 07/16/21 0339  Tue Jul 16, 2021  0017 reassessed [DS]  0332 Patient's mother is here and patient is requesting discharge.  The requesting IV taken out so she can get out of here.  She reports feeling better. 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   Delton Prairie, MD 07/16/21 [phone]

## 2021-07-31 ENCOUNTER — Encounter: Payer: Self-pay | Admitting: Emergency Medicine

## 2021-07-31 ENCOUNTER — Emergency Department
Admission: EM | Admit: 2021-07-31 | Discharge: 2021-07-31 | Disposition: A | Discharge status: Home / Self Care | Payer: Medicaid Other | Attending: Emergency Medicine | Admitting: Emergency Medicine

## 2021-07-31 DIAGNOSIS — R569 Unspecified convulsions: Secondary | ICD-10-CM

## 2021-07-31 DIAGNOSIS — E1143 Type 2 diabetes mellitus with diabetic autonomic (poly)neuropathy: Secondary | ICD-10-CM | POA: Insufficient documentation

## 2021-07-31 DIAGNOSIS — I509 Heart failure, unspecified: Secondary | ICD-10-CM | POA: Diagnosis not present

## 2021-07-31 DIAGNOSIS — D649 Anemia, unspecified: Secondary | ICD-10-CM | POA: Insufficient documentation

## 2021-07-31 LAB — COMPREHENSIVE METABOLIC PANEL
ALT: 26 U/L (ref 0–44)
AST: 32 U/L (ref 15–41)
Albumin: 3.3 g/dL — ABNORMAL LOW (ref 3.5–5.0)
Alkaline Phosphatase: 60 U/L (ref 38–126)
Anion gap: 10 (ref 5–15)
BUN: 10 mg/dL (ref 6–20)
CO2: 27 mmol/L (ref 22–32)
Calcium: 9.7 mg/dL (ref 8.9–10.3)
Chloride: 104 mmol/L (ref 98–111)
Creatinine, Ser: 0.4 mg/dL — ABNORMAL LOW (ref 0.44–1.00)
GFR, Estimated: >60 mL/min (ref >60)
Glucose, Bld: 193 mg/dL — ABNORMAL HIGH (ref 70–99)
Potassium: 3.4 mmol/L — ABNORMAL LOW (ref 3.5–5.1)
Sodium: 141 mmol/L (ref 135–145)
Total Bilirubin: 1.9 mg/dL — ABNORMAL HIGH (ref 0.3–1.2)
Total Protein: 6.1 g/dL — ABNORMAL LOW (ref 6.5–8.1)

## 2021-07-31 LAB — CBC
HCT: 26.8 % — ABNORMAL LOW (ref 36.0–46.0)
Hemoglobin: 8.4 g/dL — ABNORMAL LOW (ref 12.0–15.0)
MCH: 23.5 pg — ABNORMAL LOW (ref 26.0–34.0)
MCHC: 31.3 g/dL (ref 30.0–36.0)
MCV: 75.1 fL — ABNORMAL LOW (ref 80.0–100.0)
Platelets: 219 10*3/uL (ref 150–400)
RBC: 3.57 MIL/uL — ABNORMAL LOW (ref 3.87–5.11)
RDW: 14.1 % (ref 11.5–15.5)
WBC: 5.7 10*3/uL (ref 4.0–10.5)
nRBC: 0 % (ref 0.0–0.2)

## 2021-07-31 LAB — LIPASE, BLOOD: Lipase: 25 U/L (ref 11–51)

## 2021-07-31 MED ORDER — SODIUM CHLORIDE 0.9 % IV BOLUS
1000.0000 mL | Freq: Once | INTRAVENOUS | Status: AC
  Administered 2021-07-31: 1000 mL via INTRAVENOUS

## 2021-07-31 MED ORDER — MORPHINE SULFATE (PF) 4 MG/ML IV SOLN
4.0000 mg | Freq: Once | INTRAVENOUS | Status: AC
  Administered 2021-07-31: 4 mg via INTRAVENOUS
  Filled 2021-07-31: qty 1

## 2021-07-31 MED ORDER — LEVETIRACETAM IN NACL 1500 MG/100ML IV SOLN
1500.0000 mg | Freq: Once | INTRAVENOUS | Status: AC
  Administered 2021-07-31: 1500 mg via INTRAVENOUS
  Filled 2021-07-31 (×2): qty 100

## 2021-07-31 MED ORDER — LORAZEPAM 2 MG/ML IJ SOLN: 2.0000 mg | Freq: Once | INTRAMUSCULAR | Status: DC

## 2021-07-31 MED ORDER — ONDANSETRON HCL 4 MG/2ML IJ SOLN
4.0000 mg | Freq: Once | INTRAMUSCULAR | Status: AC
Start: 2021-07-31 — End: 2021-07-31
  Administered 2021-07-31: 4 mg via INTRAVENOUS
  Filled 2021-07-31: qty 2

## 2021-07-31 NOTE — ED Triage Notes (Signed)
By ems for seizure on and off for 30 min at home,  then one witnesses seizure en route.  Was given 2.5 mg versed IM and then was given 2.5 mg IV.  EMS reports postictal after seizure.  She was tachypneic, 150/85, fsbs 289, 18 g right wrist.  Says she has had vomiting since last Thursday and unable to keep the seizure meds down and has generalized lower abdominal pain for few days.

## 2021-07-31 NOTE — Discharge Instructions (Signed)
Please follow-up with your neurologist for recheck/reevaluation and to discuss your ongoing seizure management medication regimen.

## 2021-07-31 NOTE — ED Provider Notes (Signed)
[facility] Provider Note    Event Date/Time   First MD Initiated Contact with Patient 07/31/21 1456     (approximate)  History   Chief Complaint: Seizures  HPI  Nancy [name] is a 21 y.o. female with a past medical history of seizure disorder, CHF, diabetes, chronic pain/gastroparesis presents to the emergency department multiple seizures.  According to the patient she currently takes Lamictal for seizure disorder.  Patient was on Keppra previously but it was not working so her doctor changed her to Lamictal.  Patient states over the past several days she has had significant nausea and vomiting due to her gastroparesis.  Patient has been taking her home nausea medications without relief.  States she has not been able to keep down her Lamictal and had multiple seizures today.  Patient states she has 2 or 3 seizures every day on average but recently she has been having up to 10 seizures per day.  She is currently in talks with her neurologist trying to find a solution to this.  Patient states she has also been experiencing increased abdominal discomfort due to her gastroparesis but ran out of her pain medication at home.  Physical Exam   Triage Vital Signs: ED Triage Vitals [07/31/21 1500]  Enc Vitals Group     BP (!) 137/97     Pulse Rate (!) 117     Resp 20     Temp 99.4 F (37.4 C)     Temp Source Oral     SpO2 98 %     Weight 165 lb 5.5 oz (75 kg)     Height 5\' 3"  (1.6 m)     Head Circumference      Peak Flow      Pain Score 9     Pain Loc      Pain Edu?      Excl. in GC?     Most recent vital signs: Vitals:   07/31/21 1500  BP: (!) 137/97  Pulse: (!) 117  Resp: 20  Temp: 99.4 F (37.4 C)  SpO2: 98%    General: Awake, no distress.  CV:  Good peripheral perfusion.  Regular rate and rhythm  Resp:  Normal effort.  Equal breath sounds bilaterally.  Abd:  No distention.  Soft, nontender.  No rebound or guarding.  ED Results / Procedures  / Treatments   MEDICATIONS ORDERED IN ED: Medications  levETIRAcetam (KEPPRA) IVPB 1500 mg/ 100 mL premix (has no administration in time range)  sodium chloride 0.9 % bolus 1,000 mL (1,000 mLs Intravenous New Bag/Given 07/31/21 1526)  ondansetron (ZOFRAN) injection 4 mg (4 mg Intravenous Given 07/31/21 1527)  morphine (PF) 4 MG/ML injection 4 mg (4 mg Intravenous Given 07/31/21 1527)     IMPRESSION / MDM / ASSESSMENT AND PLAN / ED COURSE  I reviewed the triage vital signs and the nursing notes.  Patient's presentation is most consistent with acute presentation with potential threat to life or bodily function.  Patient presents emergency department for multiple seizures.  Patient states she has been nauseated with multiple episodes of vomiting over the past several days has not been able to keep down her Lamictal.  EMS states that seizure in route to the hospital was given Versed.  Here the patient is awake alert no distress.  She is able to provide a good history does not appear to be postictal at this time.  We will check labs we will treat the patient's  pain or nausea we will IV hydrate.  We will load with IV Keppra while awaiting results.  Patient agreeable to plan of care.  Patient's work-up so far is reassuring.  CBC shows anemia but not significantly changed from 2 weeks ago.  Patient's chemistry is largely nonrevealing.  Lipase normal.  Urinalysis pending.  Patient had an episode in the emergency department of seizure activity.  The seizure activity was very rhythmic.  Patient made purposeful movements throughout, intact corneal reflexes.  Then patient stopped with no postictal state and requested cranberry juice.  Episode did not seem consistent with a true tonic-clonic seizure.  Patient's lab work is reassuring with a normal lipase, reassuring chemistry, unchanged CBC.  Patient states her ride is here and she needs to leave immediately is requesting to be discharged.  We will discharge  from the emergency department.  FINAL CLINICAL IMPRESSION(S) / ED DIAGNOSES   Seizure   Note:  This document was prepared using Dragon voice recognition software and may include unintentional dictation errors.   Minna Antis, MD 07/31/21 [phone]

## 2021-08-06 ENCOUNTER — Emergency Department (HOSPITAL_COMMUNITY)
Admission: EM | Admit: 2021-08-06 | Discharge: 2021-08-06 | Disposition: A | Discharge status: Home / Self Care | Payer: Medicaid Other | Attending: Emergency Medicine | Admitting: Emergency Medicine

## 2021-08-06 ENCOUNTER — Encounter (HOSPITAL_COMMUNITY): Payer: Self-pay

## 2021-08-06 ENCOUNTER — Emergency Department (HOSPITAL_COMMUNITY): Payer: Medicaid Other

## 2021-08-06 ENCOUNTER — Other Ambulatory Visit: Payer: Self-pay

## 2021-08-06 DIAGNOSIS — W2209XA Striking against other stationary object, initial encounter: Secondary | ICD-10-CM | POA: Diagnosis not present

## 2021-08-06 DIAGNOSIS — Z79899 Other long term (current) drug therapy: Secondary | ICD-10-CM | POA: Diagnosis not present

## 2021-08-06 DIAGNOSIS — E119 Type 2 diabetes mellitus without complications: Secondary | ICD-10-CM | POA: Insufficient documentation

## 2021-08-06 DIAGNOSIS — S0990XA Unspecified injury of head, initial encounter: Secondary | ICD-10-CM

## 2021-08-06 DIAGNOSIS — R000 Tachycardia, unspecified: Secondary | ICD-10-CM | POA: Insufficient documentation

## 2021-08-06 DIAGNOSIS — R569 Unspecified convulsions: Secondary | ICD-10-CM | POA: Diagnosis not present

## 2021-08-06 DIAGNOSIS — Z794 Long term (current) use of insulin: Secondary | ICD-10-CM | POA: Insufficient documentation

## 2021-08-06 DIAGNOSIS — R112 Nausea with vomiting, unspecified: Secondary | ICD-10-CM | POA: Diagnosis not present

## 2021-08-06 DIAGNOSIS — R1084 Generalized abdominal pain: Secondary | ICD-10-CM | POA: Insufficient documentation

## 2021-08-06 DIAGNOSIS — M25562 Pain in left knee: Secondary | ICD-10-CM

## 2021-08-06 DIAGNOSIS — I509 Heart failure, unspecified: Secondary | ICD-10-CM | POA: Insufficient documentation

## 2021-08-06 DIAGNOSIS — S0101XA Laceration without foreign body of scalp, initial encounter: Secondary | ICD-10-CM | POA: Diagnosis not present

## 2021-08-06 DIAGNOSIS — G8929 Other chronic pain: Secondary | ICD-10-CM

## 2021-08-06 LAB — COMPREHENSIVE METABOLIC PANEL
ALT: 22 U/L (ref 0–44)
AST: 34 U/L (ref 15–41)
Albumin: 3.2 g/dL — ABNORMAL LOW (ref 3.5–5.0)
Alkaline Phosphatase: 54 U/L (ref 38–126)
Anion gap: 9 (ref 5–15)
BUN: 9 mg/dL (ref 6–20)
CO2: 26 mmol/L (ref 22–32)
Calcium: 10.4 mg/dL — ABNORMAL HIGH (ref 8.9–10.3)
Chloride: 106 mmol/L (ref 98–111)
Creatinine, Ser: <0.3 mg/dL — ABNORMAL LOW (ref 0.44–1.00)
Glucose, Bld: 135 mg/dL — ABNORMAL HIGH (ref 70–99)
Potassium: 3.6 mmol/L (ref 3.5–5.1)
Sodium: 141 mmol/L (ref 135–145)
Total Bilirubin: 1.4 mg/dL — ABNORMAL HIGH (ref 0.3–1.2)
Total Protein: 6.1 g/dL — ABNORMAL LOW (ref 6.5–8.1)

## 2021-08-06 LAB — CBC WITH DIFFERENTIAL/PLATELET
Abs Immature Granulocytes: 0.01 10*3/uL (ref 0.00–0.07)
Basophils Absolute: 0 10*3/uL (ref 0.0–0.1)
Basophils Relative: 0 %
Eosinophils Absolute: 0 10*3/uL (ref 0.0–0.5)
Eosinophils Relative: 1 %
HCT: 27.4 % — ABNORMAL LOW (ref 36.0–46.0)
Hemoglobin: 8.7 g/dL — ABNORMAL LOW (ref 12.0–15.0)
Immature Granulocytes: 0 %
Lymphocytes Relative: 35 %
Lymphs Abs: 1.7 10*3/uL (ref 0.7–4.0)
MCH: 24.2 pg — ABNORMAL LOW (ref 26.0–34.0)
MCHC: 31.8 g/dL (ref 30.0–36.0)
MCV: 76.3 fL — ABNORMAL LOW (ref 80.0–100.0)
Monocytes Absolute: 0.9 10*3/uL (ref 0.1–1.0)
Monocytes Relative: 19 %
Neutro Abs: 2.2 10*3/uL (ref 1.7–7.7)
Neutrophils Relative %: 45 %
Platelets: 208 10*3/uL (ref 150–400)
RBC: 3.59 MIL/uL — ABNORMAL LOW (ref 3.87–5.11)
RDW: 14.5 % (ref 11.5–15.5)
WBC: 4.9 10*3/uL (ref 4.0–10.5)
nRBC: 0 % (ref 0.0–0.2)

## 2021-08-06 LAB — I-STAT BETA HCG BLOOD, ED (MC, WL, AP ONLY): I-stat hCG, quantitative: <5 m[IU]/mL (ref <5)

## 2021-08-06 LAB — LIPASE, BLOOD: Lipase: 26 U/L (ref 11–51)

## 2021-08-06 LAB — CBG MONITORING, ED: Glucose-Capillary: 132 mg/dL — ABNORMAL HIGH (ref 70–99)

## 2021-08-06 MED ORDER — SODIUM CHLORIDE 0.9 % IV SOLN
12.5000 mg | Freq: Four times a day (QID) | INTRAVENOUS | Status: DC | PRN
  Administered 2021-08-06: 12.5 mg via INTRAVENOUS
  Filled 2021-08-06 (×2): qty 0.5

## 2021-08-06 MED ORDER — SODIUM CHLORIDE 0.9 % IV BOLUS
1000.0000 mL | Freq: Once | INTRAVENOUS | Status: AC
  Administered 2021-08-06: 1000 mL via INTRAVENOUS

## 2021-08-06 NOTE — ED Triage Notes (Signed)
Pt BIB EMS from home. Pt had a witnessed grand mal seizure earlier today. Pt fell and hit her head on a chair and has a lac to the top of her head. When fire arrived pt was post-ictal. EMS reports pt had an episode of shaking on the truck. Pt also endorses abdominal pain and N/V x1.5 weeks.

## 2021-08-06 NOTE — ED Provider Notes (Signed)
Signout received on this 21 year old female.  At the time of signout patient is awaiting completion of her fluids, and ambulation trial.  Patient has been able to ambulate without difficulty and is requesting discharge.  Patient's work-up was overall reassuring.  She is tachycardic however this is baseline for her on review of recent visits. Physical Exam  BP (!) 154/79   Pulse (!) 123   Temp 99 F (37.2 C) (Oral)   Resp (!) 26   LMP 07/02/2021 Comment: neg preg test  SpO2 100%     Procedures  Procedures  ED Course / MDM    Medical Decision Making Amount and/or Complexity of Data Reviewed Labs: ordered. Radiology: ordered.   Patient discharged in stable condition.  Follow-up discussed.      Marita Kansas, PA-C 08/06/21 1514    Gerhard Munch, MD 08/06/21 1534

## 2021-08-06 NOTE — ED Provider Notes (Signed)
Nancy [name]   CSN: [id number] Arrival date & time: 08/06/21  1127     History  Chief Complaint  Patient presents with   Seizures   Head Injury    Nancy [name] is a 21 y.o. female with medical history of CHF, diabetes mellitus type 2, pseudoseizures and seizures.  Presents to the emergency department today with seizure, abdominal pain, nausea and vomiting.  EMS reports that patient had a witnessed grand mal seizure earlier today.  Patient fell due to her seizure striking her head on a chair.  EMS reports that fire stated the patient was postictal upon their arrival.  Patient has alert with EMS and she has not had any further seizures.  Patient states that she is currently taking Lamictal 3 times daily for seizures.  Patient has not missed any doses of this medication.  Patient states that her aunt witnessed the seizure.  She reports that on stated the seizure lasted for approximate 7 to 8 minutes and involve full body tonic-clonic seizure.  Patient endorses pain to left knee and had from her fall.  Patient reports pain to left knee is everywhere and states that she cannot bend her knee due to pain.  Patient reports vomiting twice after her head injury.  Patient reports chronic abdominal pain.  He states that today abdomen feels "more tender."  Patient also reports history of persistent nausea and vomiting.  Patient states that she had vomited multiple times prior to her seizure earlier this morning.  Patient reports that she has multi antiemetics at the house including Reglan, Zofran, Compazine, and Phenergan.  Patient reports that she has not tried any of this medication today to help with her nausea and vomiting.  Patient denies any numbness, weakness, saddle anesthesia, bowel/bladder incontinence, dysuria, hematuria, urinary urgency, urinary frequency, vaginal pain, vaginal bleeding, vaginal discharge, hematemesis, coffee-ground emesis,  melena, visual disturbance, facial asymmetry, dysarthria, neck pain, back pain.  Seizures Head Injury Associated symptoms: headache, nausea and vomiting   Associated symptoms: no neck pain, no numbness and no seizures        Home Medications Prior to Admission medications   Medication Sig Start Date End Date Taking? Authorizing Provider  ARIPiprazole (ABILIFY) 10 MG tablet Take 10 mg by mouth in the morning and at bedtime.  Patient not taking: Reported on 06/02/2021    [provider]  atorvastatin (LIPITOR) 20 MG tablet Take 1 tablet (20 mg total) by mouth daily at 6 PM. Patient taking differently: Take 20 mg by mouth daily. 10/05/18   Storm Frisk, MD  capsaicin (ZOSTRIX) 0.025 % cream Apply topically 2 (two) times daily. Patient not taking: Reported on 06/02/2021 02/29/20   Maretta Bees, MD  diclofenac Sodium (VOLTAREN) 1 % GEL Apply 1 application topically 2 (two) times daily as needed (chest pains). Patient not taking: Reported on 06/02/2021    [provider]  famotidine (PEPCID) 20 MG tablet Take 1 tablet (20 mg total) by mouth 2 (two) times daily. 07/12/21   Sharman Cheek, MD  insulin aspart (NOVOLOG FLEXPEN) 100 UNIT/ML FlexPen Inject 15 Units into the skin 2 (two) times daily before a meal. Patient not taking: Reported on 06/02/2021    [provider]  insulin glargine (LANTUS) 100 unit/mL SOPN Inject 30 Units into the skin at bedtime. Patient not taking: Reported on 06/02/2021 02/29/20   Maretta Bees, MD  lamoTRIgine (LAMICTAL) 25 MG tablet Take 2 tablets (50 mg total) by  mouth 2 (two) times daily. For mood stabilization Patient not taking: Reported on 06/02/2021 11/11/18   Armandina Stammer I, NP  losartan (COZAAR) 25 MG tablet Take 25 mg by mouth daily. 05/20/21   [provider]  lubiprostone (AMITIZA) 24 MCG capsule Take 1 capsule (24 mcg total) by mouth 2 (two) times daily with a meal. Patient taking differently: Take 24 mcg by  mouth daily with breakfast. 10/27/18   Esterwood, Amy S, PA-C  metoCLOPramide (REGLAN) 10 MG tablet Take 1 tablet (10 mg total) by mouth 3 (three) times daily with meals. Patient not taking: Reported on 06/02/2021 05/07/21 05/07/22  Loleta [name], MD  mirtazapine (REMERON) 7.5 MG tablet Take 1 tablet (7.5 mg total) by mouth at bedtime. For depression/sleep Patient not taking: Reported on 06/02/2021 11/11/18   Armandina Stammer I, NP  Multiple Vitamin (MULTIVITAMIN WITH MINERALS) TABS tablet Take 1 tablet by mouth daily. Patient not taking: Reported on 06/02/2021 09/18/18   Clapacs, Jackquline Denmark, MD  ondansetron (ZOFRAN ODT) 4 MG disintegrating tablet 4mg  ODT q4 hours prn nausea/vomit Patient not taking: Reported on 06/02/2021 08/21/20   Mesner, Barbara Cower, MD  ondansetron (ZOFRAN-ODT) 4 MG disintegrating tablet Take 1 tablet (4 mg total) by mouth every 8 (eight) hours as needed for nausea or vomiting. 07/12/21   Sharman Cheek, MD  ondansetron (ZOFRAN-ODT) 4 MG disintegrating tablet Take 1 tablet (4 mg total) by mouth every 8 (eight) hours as needed. 07/16/21   Delton Prairie, MD  promethazine (PHENERGAN) 12.5 MG tablet Take 12.5 mg by mouth every 8 (eight) hours as needed. Patient not taking: Reported on 06/02/2021 04/15/21   [provider]  promethazine (PHENERGAN) 25 MG suppository Place 1 suppository (25 mg total) rectally every 8 (eight) hours as needed for nausea or vomiting. 06/30/21   Phineas Semen, MD  propranolol (INDERAL) 20 MG tablet Take 1 tablet (20 mg total) by mouth 2 (two) times daily. For anxiety/HTN Patient taking differently: Take 20 mg by mouth daily. For anxiety/HTN 12/23/18   Swayze, Ava, DO  QUEtiapine (SEROQUEL) 100 MG tablet Take 300 mg by mouth at bedtime. 07/05/19   [provider]  QUEtiapine (SEROQUEL) 50 MG tablet Take 50 mg by mouth 2 (two) times daily. Patient not taking: Reported on 03/07/2021 12/13/20   [provider]  senna-docusate (SENOKOT-S) 8.6-50 MG tablet Take  2 tablets by mouth at bedtime. 02/29/20   Ghimire, Werner Lean, MD  topiramate (TOPAMAX) 25 MG tablet Take 1 tablet (25 mg total) by mouth 2 (two) times daily. For seizure activities Patient not taking: Reported on 06/02/2021 11/11/18   Armandina Stammer I, NP  prochlorperazine (COMPAZINE) 25 MG suppository PLACE 1 SUPPOSITORY (25 MG TOTAL) RECTALLY EVERY TWELVE HOURS AS NEEDED FOR NAUSEA OR VOMITING. 02/29/20 02/29/20  Maretta Bees, MD      Allergies    Tomato, Ibuprofen, and Oatmeal    Review of Systems   Review of Systems  Constitutional:  Negative for chills and fever.  HENT:  Negative for facial swelling.   Eyes:  Negative for visual disturbance.  Respiratory:  Negative for shortness of breath.   Cardiovascular:  Negative for chest pain.  Gastrointestinal:  Positive for abdominal pain, nausea and vomiting. Negative for abdominal distention, anal bleeding, blood in stool, constipation, diarrhea and rectal pain.  Genitourinary:  Negative for difficulty urinating and dysuria.  Musculoskeletal:  Positive for arthralgias. Negative for back pain and neck pain.  Skin:  Negative for color change, pallor, rash and wound.  Neurological:  Positive for headaches. Negative for dizziness, tremors, seizures, syncope, facial asymmetry, speech difficulty, weakness, light-headedness and numbness.  Psychiatric/Behavioral:  Negative for confusion.     Physical Exam Updated Vital Signs BP (!) 176/115 (BP Location: Right Arm)   Pulse (!) 130   Temp 99 F (37.2 C) (Oral)   Resp (!) 29   LMP 07/02/2021   SpO2 100%  Physical Exam Vitals and nursing [name] reviewed.  Constitutional:      General: She is not in acute distress.    Appearance: She is not ill-appearing, toxic-appearing or diaphoretic.  HENT:     Head: Normocephalic. Laceration present. No raccoon eyes, Battle's sign, abrasion, contusion, masses, right periorbital erythema or left periorbital erythema.      Comments: 2 cm laceration to  patient scalp as indicated above.  Wound is extremely shallow Eyes:     General:        Right eye: No discharge.        Left eye: No discharge.     Extraocular Movements: Extraocular movements intact.     Conjunctiva/sclera: Conjunctivae normal.     Pupils: Pupils are equal, round, and reactive to light.  Cardiovascular:     Rate and Rhythm: Normal rate.  Pulmonary:     Effort: Pulmonary effort is normal.  Abdominal:     General: Abdomen is flat. There is no distension. There are no signs of injury.     Palpations: Abdomen is soft. There is no mass or pulsatile mass.     Tenderness: There is generalized abdominal tenderness. There is no guarding or rebound.     Hernia: There is no hernia in the umbilical area or ventral area.     Comments: Mild generalized tenderness throughout entire abdomen  Musculoskeletal:     Cervical back: Normal range of motion and neck supple. No swelling, edema, deformity, erythema, signs of trauma, lacerations, rigidity, spasms, torticollis, tenderness, bony tenderness or crepitus. No pain with movement. Normal range of motion.     Thoracic back: No swelling, edema, deformity, signs of trauma, lacerations, spasms, tenderness or bony tenderness.     Lumbar back: No swelling, edema, deformity, signs of trauma, lacerations, spasms, tenderness or bony tenderness.     Right knee: No swelling, deformity, effusion, erythema, ecchymosis, lacerations, bony tenderness or crepitus. Normal range of motion. No tenderness. Normal alignment.     Left knee: Bony tenderness present. No swelling, deformity, effusion, erythema, ecchymosis, lacerations or crepitus. Decreased range of motion. Tenderness present over the medial joint line. Normal alignment.     Right lower leg: Normal.     Left lower leg: Normal.     Right ankle: No swelling, deformity, ecchymosis or lacerations. No tenderness. Normal range of motion.     Left ankle: No swelling, deformity, ecchymosis or lacerations.  No tenderness. Normal range of motion.     Left foot: Normal range of motion and normal capillary refill. No swelling, deformity, laceration, tenderness, bony tenderness or crepitus. Normal pulse.     Comments: Diffuse tenderness to left knee.  Decreased range of motion secondary to complaints of pain.  No swelling, erythema, warmth, or wound noted to left knee.  No tenderness, bony tenderness, or deformity to bilateral upper and right lower extremity  Skin:    General: Skin is warm and dry.  Neurological:     General: No focal deficit present.     Mental Status: She is alert.     GCS: GCS eye subscore is 4. GCS  verbal subscore is 5. GCS motor subscore is 6.     Cranial Nerves: Cranial nerves 2-12 are intact. No cranial nerve deficit, dysarthria or facial asymmetry.     Sensory: Sensation is intact.     Motor: No weakness, tremor, seizure activity or pronator drift.     Comments: CN II-XII intact, equal grip strength, patient moves bilateral upper and right lower extremity without difficulty.  Difficulty moving left lower extremity due to complaints of pain.  Psychiatric:        Behavior: Behavior is cooperative.     ED Results / Procedures / Treatments   Labs (all labs ordered are listed, but only abnormal results are displayed) Labs Reviewed  CBC WITH DIFFERENTIAL/PLATELET - Abnormal; Notable for the following components:      Result Value   RBC 3.59 (*)    Hemoglobin 8.7 (*)    HCT 27.4 (*)    MCV 76.3 (*)    MCH 24.2 (*)    All other components within normal limits  COMPREHENSIVE METABOLIC PANEL - Abnormal; Notable for the following components:   Glucose, Bld 135 (*)    Creatinine, Ser <0.30 (*)    Calcium 10.4 (*)    Total Protein 6.1 (*)    Albumin 3.2 (*)    Total Bilirubin 1.4 (*)    All other components within normal limits  CBG MONITORING, ED - Abnormal; Notable for the following components:   Glucose-Capillary 132 (*)    All other components within normal limits   LIPASE, BLOOD  LAMOTRIGINE LEVEL  I-STAT BETA HCG BLOOD, ED ([facility], [facility], [facility] ONLY)    EKG None  Radiology CT HEAD WO CONTRAST ( )  Result Date: 08/06/2021 CLINICAL DATA:  Grand mal seizure. EXAM: CT HEAD WITHOUT CONTRAST TECHNIQUE: Contiguous axial images were obtained from the base of the skull through the vertex without intravenous contrast. RADIATION DOSE REDUCTION: This exam was performed according to the departmental dose-optimization program which includes automated exposure control, adjustment of the mA and/or kV according to patient size and/or use of iterative reconstruction technique. COMPARISON:  06/02/2021 FINDINGS: Brain: The ventricles are normal in size and configuration. No extra-axial fluid collections are identified. The gray-white differentiation is maintained. No CT findings for acute hemispheric infarction or intracranial hemorrhage. No mass lesions. The brainstem and cerebellum are normal. Vascular: No hyperdense vessels or obvious aneurysm. Skull: No acute skull fracture. No bone lesion. Sinuses/Orbits: The paranasal sinuses and mastoid air cells are clear. The globes are intact. Other: No scalp lesions, laceration or hematoma. IMPRESSION: Normal head CT. Electronically Signed   By: Rudie Meyer M.D.   On: 08/06/2021 12:49    Procedures Procedures    Medications Ordered in ED Medications  sodium chloride 0.9 % bolus 1,000 mL (has no administration in time range)  promethazine (PHENERGAN) 12.5 mg in sodium chloride 0.9 % 50 mL IVPB (has no administration in time range)    ED Course/ Medical Decision Making/ A&P                           Medical Decision Making Amount and/or Complexity of Data Reviewed Labs: ordered. Radiology: ordered.   Alert 21 year old female in no acute distress, nontoxic-appearing.  Presents to the emerged department complaint of seizure, abdominal pain, nausea and vomiting.  Information was obtained from patient.  I reviewed patient's  medical records including previous provider notes, labs, and imaging.  Patient has medical history as outlined in HPI  which complicates her care.   Per chart review patient was seen at [facility] emergency department on 07/31/2021 for seizure-like activity.  Patient reported noncompliance with Lamictal secondary to nausea and vomiting.  Patient received Versed with EMS.  While in the emergency department patient had an episode of seizure activity however this was described as very rhythmic with purposeful movements throughout and intact corneal reflexes.  Patient stops this incident with no postictal state and was alert.  Episode do not seem consistent with a true clonic clonic seizure.  Patient has history of chronic abdominal pain.  Is followed by Dr. Adela Lank with [facility] gastroenterology.  Most recently patient had CT abdomen pelvis on 07/16/2021 which showed no acute finding in the abdomen or pelvis.  Patient's last tetanus 09/2018  Due to reports of seizure we will take EKG, CBG, Lamictal level, and electrolytes.  Due to reports of abdominal pain will check CMP, lipase, and CBC.  Due to having a head injury with multiple episodes of vomiting afterwards will obtain noncontrast head CT to look for acute intracranial abnormality.  Will obtain x-ray imaging of left knee due to reports of pain.  Patient given fluids and Phenergan.  I personally viewed and interpreted patient's EKG.  Tracing shows sinus tachycardia.  I personally viewed and interpreted patient's lab results.  Pertinent findings include: -Anemia however baseline for patient. -AST, ALT, alk phos and lipase all within normal limits -No large electrolyte abnormality -Total bilirubin elevated 1.4, appears baseline for patient -I-STAT beta-hCG less than 5  I personally viewed and interpreted patient CT imaging.  Agree with radiology interpretation of no acute intracranial abnormality.  I personally viewed and interpreted  patient's x-ray imaging.  Agree with radiology interpretation of no acute osseous abnormality.  Patient has resolution of nausea and vomiting after receiving Phenergan.  Patient is able to tolerate p.o. intake without difficulty.  Patient will need to follow-up with her gastroenterologist Dr. Adela Lank in outpatient setting for further management of her chronic abdominal pain, nausea, and vomiting.  Patient has antiemetics at home to treat her nausea and vomiting.  Patient has no further seizures while in the emergency department.  Patient to follow-up closely with her neurologist for further management of her seizures.  While in the emergency department patient has been noted to be persistently tachycardic.  Patient has documented history of sinus tachycardia on multiple visits.  Suspect that this is patient's baseline and not abnormal for her.  Patient care transferred to PA Osseo at the end of my shift. Patient presentation, ED course, and plan of care discussed with review of all pertinent labs and imaging. Please see his/her [name] for further details regarding further ED course and disposition.           Final Clinical Impression(s) / ED Diagnoses Final diagnoses:  Seizure (HCC)  Injury of head, initial encounter  Laceration of scalp, initial encounter  Chronic abdominal pain  Nausea and vomiting, unspecified vomiting type  Acute pain of left knee    Rx / DC Orders ED Discharge Orders     None         Haskel Schroeder, PA-C 08/06/21 1510    Gwyneth Sprout, MD 08/07/21 [phone]

## 2021-08-06 NOTE — Discharge Instructions (Signed)
You came to the emergency department today to be evaluated after having a seizure.  Your lab results are reassuring.  The CT scan of your head did not show any brain bleeds.  The x-ray of your left knee did not show any broken bones or dislocations.  Please continue to take your Lamictal and follow-up closely with your neurologist.  Please continue to use your antinausea medicine as prescribed.  Follow-up closely with your gastroenterologist.  Get help right away if: You have: A seizure that does not stop after 5 minutes. Several seizures in a row without a complete recovery between seizures. A seizure that makes it harder to breathe. A seizure that leaves you unable to speak or use a part of your body. You do not wake up right away after a seizure. You injure yourself during a seizure. You have confusion or pain right after a seizure. You cannot stop vomiting. Your pain is only in areas of the abdomen, such as the right side or the left lower portion of the abdomen. Pain on the right side could be caused by appendicitis. You have bloody or black stools, or stools that look like tar. You have severe pain, cramping, or bloating in your abdomen. You have signs of dehydration, such as: Dark urine, very little urine, or no urine. Cracked lips. Dry mouth. Sunken eyes. Sleepiness. Weakness. You have trouble breathing or chest pain.

## 2021-08-07 LAB — LAMOTRIGINE LEVEL: Lamotrigine Lvl: <1 ug/mL — ABNORMAL LOW (ref 2.0–20.0)

## 2021-08-09 ENCOUNTER — Emergency Department
Admission: EM | Admit: 2021-08-09 | Discharge: 2021-08-09 | Disposition: A | Discharge status: Home / Self Care | Payer: Medicaid Other | Attending: Emergency Medicine | Admitting: Emergency Medicine

## 2021-08-09 ENCOUNTER — Emergency Department: Payer: Medicaid Other

## 2021-08-09 ENCOUNTER — Other Ambulatory Visit: Payer: Self-pay

## 2021-08-09 DIAGNOSIS — R569 Unspecified convulsions: Secondary | ICD-10-CM | POA: Diagnosis not present

## 2021-08-09 LAB — CBC WITH DIFFERENTIAL/PLATELET
Abs Immature Granulocytes: 0.01 10*3/uL (ref 0.00–0.07)
Basophils Absolute: 0 10*3/uL (ref 0.0–0.1)
Basophils Relative: 0 %
Eosinophils Absolute: 0 10*3/uL (ref 0.0–0.5)
Eosinophils Relative: 0 %
HCT: 25.4 % — ABNORMAL LOW (ref 36.0–46.0)
Hemoglobin: 7.9 g/dL — ABNORMAL LOW (ref 12.0–15.0)
Immature Granulocytes: 0 %
Lymphocytes Relative: 24 %
Lymphs Abs: 0.9 10*3/uL (ref 0.7–4.0)
MCH: 23.5 pg — ABNORMAL LOW (ref 26.0–34.0)
MCHC: 31.1 g/dL (ref 30.0–36.0)
MCV: 75.6 fL — ABNORMAL LOW (ref 80.0–100.0)
Monocytes Absolute: 0.3 10*3/uL (ref 0.1–1.0)
Monocytes Relative: 9 %
Neutro Abs: 2.4 10*3/uL (ref 1.7–7.7)
Neutrophils Relative %: 67 %
Platelets: 148 10*3/uL — ABNORMAL LOW (ref 150–400)
RBC: 3.36 MIL/uL — ABNORMAL LOW (ref 3.87–5.11)
RDW: 14.3 % (ref 11.5–15.5)
WBC: 3.7 10*3/uL — ABNORMAL LOW (ref 4.0–10.5)
nRBC: 0 % (ref 0.0–0.2)

## 2021-08-09 LAB — URINALYSIS, ROUTINE W REFLEX MICROSCOPIC
Bilirubin Urine: NEGATIVE
Glucose, UA: >=500 mg/dL — AB
Hgb urine dipstick: NEGATIVE
Ketones, ur: 20 mg/dL — AB
Leukocytes,Ua: NEGATIVE
Nitrite: NEGATIVE
Protein, ur: NEGATIVE mg/dL
Specific Gravity, Urine: 1.021 (ref 1.005–1.030)
pH: 5 (ref 5.0–8.0)

## 2021-08-09 LAB — BASIC METABOLIC PANEL
Anion gap: 8 (ref 5–15)
BUN: 10 mg/dL (ref 6–20)
CO2: 28 mmol/L (ref 22–32)
Calcium: 10 mg/dL (ref 8.9–10.3)
Chloride: 106 mmol/L (ref 98–111)
Creatinine, Ser: 0.31 mg/dL — ABNORMAL LOW (ref 0.44–1.00)
GFR, Estimated: >60 mL/min (ref >60)
Glucose, Bld: 152 mg/dL — ABNORMAL HIGH (ref 70–99)
Potassium: 3.8 mmol/L (ref 3.5–5.1)
Sodium: 142 mmol/L (ref 135–145)

## 2021-08-09 LAB — TROPONIN I (HIGH SENSITIVITY): Troponin I (High Sensitivity): 13 ng/L (ref <18)

## 2021-08-09 MED ORDER — HALOPERIDOL LACTATE 5 MG/ML IJ SOLN
5.0000 mg | Freq: Once | INTRAMUSCULAR | Status: AC
  Administered 2021-08-09: 5 mg via INTRAVENOUS
  Filled 2021-08-09: qty 1

## 2021-08-09 MED ORDER — SODIUM CHLORIDE 0.9 % IV BOLUS
1000.0000 mL | Freq: Once | INTRAVENOUS | Status: AC
  Administered 2021-08-09: 1000 mL via INTRAVENOUS

## 2021-08-09 NOTE — ED Provider Notes (Signed)
[facility] Provider Note    Event Date/Time   First MD Initiated Contact with Patient 08/09/21 1757     (approximate)   History   Seizures   HPI  Nancy [name] is a 21 y.o. female who presents to the emergency department today after apparent seizure-like activity.  The patient was at home when this happened.  It was preceded by vomiting.  The vomiting has been a somewhat chronic issue for the patient.  She does follow-up with GI.  She says that frequently after the vomiting she will get seizure-like activity.  She also sees neurology and states she has been taking her antiseizure medications.  Patient denies any recent fevers.  Physical Exam   Triage Vital Signs: ED Triage Vitals  Enc Vitals Group     BP 08/09/21 1750 (!) 160/78     Pulse Rate 08/09/21 1750 (!) 121     Resp 08/09/21 1750 (!) 22     Temp 08/09/21 1750 98.3 F (36.8 C)     Temp Source 08/09/21 1750 Oral     SpO2 08/09/21 1750 99 %     Weight 08/09/21 1753 167 lb (75.8 kg)     Height 08/09/21 1753 5\' 2"  (1.575 m)     Head Circumference --      Peak Flow --      Pain Score 08/09/21 1751 9     Pain Loc --      Pain Edu? --      Excl. in GC? --     Most recent vital signs: Vitals:   08/09/21 1750  BP: (!) 160/78  Pulse: (!) 121  Resp: (!) 22  Temp: 98.3 F (36.8 C)  SpO2: 99%    General: Awake, alert, oriented. CV:  Good peripheral perfusion. Tachycardia, regular rhythm Resp:  Normal effort. Lungs clear. Abd:  No distention. Non tender.    ED Results / Procedures / Treatments   Labs (all labs ordered are listed, but only abnormal results are displayed) Labs Reviewed  CBC WITH DIFFERENTIAL/PLATELET - Abnormal; Notable for the following components:      Result Value   WBC 3.7 (*)    RBC 3.36 (*)    Hemoglobin 7.9 (*)    HCT 25.4 (*)    MCV 75.6 (*)    MCH 23.5 (*)    Platelets 148 (*)    All other components within normal limits  BASIC METABOLIC PANEL -  Abnormal; Notable for the following components:   Glucose, Bld 152 (*)    Creatinine, Ser 0.31 (*)    All other components within normal limits  URINALYSIS, ROUTINE W REFLEX MICROSCOPIC - Abnormal; Notable for the following components:   Color, Urine YELLOW (*)    APPearance HAZY (*)    Glucose, UA >=500 (*)    Ketones, ur 20 (*)    Bacteria, UA RARE (*)    All other components within normal limits  TROPONIN I (HIGH SENSITIVITY)     EKG  None   RADIOLOGY None   PROCEDURES:  Critical Care performed: No  Procedures   MEDICATIONS ORDERED IN ED: Medications - No data to display   IMPRESSION / MDM / ASSESSMENT AND PLAN / ED COURSE  I reviewed the triage vital signs and the nursing notes.                              Differential diagnosis  includes, but is not limited to, epilepsy, pseudoseizure, syncope.  Patient's presentation is most consistent with acute presentation with potential threat to life or bodily function.  Patient presented to the emergency department today after concerns for seizure-like activity.  Patient does have history of similar episodes in the past.  She also had some vomiting today which she states is also somewhat chronic issue.  Work-up here in the emergency department does show a slight worsening of her chronic anemia.  Blood work without any concerning electrolyte abnormalities.  Plan was to observe patient for further seizure-like activities or vomiting.  Prior to being able to discuss follow-up plans with patient she eloped from the emergency department.   FINAL CLINICAL IMPRESSION(S) / ED DIAGNOSES   Final diagnoses:  Seizure-like activity (HCC)    Note:  This document was prepared using Dragon voice recognition software and may include unintentional dictation errors.    Phineas Semen, MD 08/09/21 2252

## 2021-08-09 NOTE — ED Triage Notes (Addendum)
Per EMS pt's mother called ems due to seizure like activity. Pt then had two seizures with ems. Was given 5mg  versed and then 2.5mg  versed. Pt arrived A&Ox3 denies N/V/D. Pt states she is taking her medication as prescribed.

## 2021-08-09 NOTE — ED Notes (Signed)
ER provider notified of elevated HR. Pt states she takes amlodipine and denies taking her dose today. RN offered to request medication from ER provider and pt refused.

## 2021-08-09 NOTE — ED Notes (Signed)
RN returned from procedural area and pt no longer in Emergency Department. ER provider notified.

## 2021-08-09 NOTE — ED Notes (Signed)
Pt asking to leave AMA. Remaining labs collected by RN, v/s updated in EHR and ED provider notified. Pt advised that ED provider will be in to see her shortly, pt voiced understanding.

## 2021-08-19 ENCOUNTER — Ambulatory Visit: Payer: Medicaid Other | Admitting: Cardiology

## 2021-08-20 ENCOUNTER — Encounter (HOSPITAL_COMMUNITY): Payer: Self-pay

## 2021-08-20 ENCOUNTER — Encounter: Payer: Self-pay | Admitting: Emergency Medicine

## 2021-08-20 ENCOUNTER — Other Ambulatory Visit: Payer: Self-pay

## 2021-08-20 ENCOUNTER — Inpatient Hospital Stay
Admission: EM | Admit: 2021-08-20 | Discharge: 2021-08-23 | DRG: 643 | Disposition: A | Discharge status: Home / Self Care | Payer: Medicaid Other | Attending: Obstetrics and Gynecology | Admitting: Obstetrics and Gynecology

## 2021-08-20 DIAGNOSIS — G40909 Epilepsy, unspecified, not intractable, without status epilepticus: Secondary | ICD-10-CM | POA: Diagnosis present

## 2021-08-20 DIAGNOSIS — M545 Low back pain, unspecified: Secondary | ICD-10-CM | POA: Diagnosis present

## 2021-08-20 DIAGNOSIS — Z6830 Body mass index (BMI) 30.0-30.9, adult: Secondary | ICD-10-CM

## 2021-08-20 DIAGNOSIS — G8929 Other chronic pain: Secondary | ICD-10-CM | POA: Diagnosis present

## 2021-08-20 DIAGNOSIS — Z8249 Family history of ischemic heart disease and other diseases of the circulatory system: Secondary | ICD-10-CM | POA: Diagnosis not present

## 2021-08-20 DIAGNOSIS — Z833 Family history of diabetes mellitus: Secondary | ICD-10-CM | POA: Diagnosis not present

## 2021-08-20 DIAGNOSIS — F419 Anxiety disorder, unspecified: Secondary | ICD-10-CM | POA: Diagnosis present

## 2021-08-20 DIAGNOSIS — D638 Anemia in other chronic diseases classified elsewhere: Secondary | ICD-10-CM | POA: Diagnosis present

## 2021-08-20 DIAGNOSIS — E10649 Type 1 diabetes mellitus with hypoglycemia without coma: Secondary | ICD-10-CM | POA: Diagnosis present

## 2021-08-20 DIAGNOSIS — E0591 Thyrotoxicosis, unspecified with thyrotoxic crisis or storm: Secondary | ICD-10-CM | POA: Diagnosis not present

## 2021-08-20 DIAGNOSIS — G9341 Metabolic encephalopathy: Secondary | ICD-10-CM | POA: Diagnosis present

## 2021-08-20 DIAGNOSIS — K3184 Gastroparesis: Secondary | ICD-10-CM | POA: Diagnosis present

## 2021-08-20 DIAGNOSIS — F1910 Other psychoactive substance abuse, uncomplicated: Secondary | ICD-10-CM | POA: Diagnosis present

## 2021-08-20 DIAGNOSIS — E0581 Other thyrotoxicosis with thyrotoxic crisis or storm: Secondary | ICD-10-CM

## 2021-08-20 DIAGNOSIS — Z794 Long term (current) use of insulin: Secondary | ICD-10-CM | POA: Diagnosis not present

## 2021-08-20 DIAGNOSIS — E1043 Type 1 diabetes mellitus with diabetic autonomic (poly)neuropathy: Secondary | ICD-10-CM | POA: Diagnosis present

## 2021-08-20 DIAGNOSIS — I10 Essential (primary) hypertension: Secondary | ICD-10-CM | POA: Diagnosis present

## 2021-08-20 DIAGNOSIS — Z79899 Other long term (current) drug therapy: Secondary | ICD-10-CM | POA: Diagnosis not present

## 2021-08-20 DIAGNOSIS — Z91018 Allergy to other foods: Secondary | ICD-10-CM | POA: Diagnosis not present

## 2021-08-20 DIAGNOSIS — Z886 Allergy status to analgesic agent status: Secondary | ICD-10-CM

## 2021-08-20 DIAGNOSIS — F32A Depression, unspecified: Secondary | ICD-10-CM | POA: Diagnosis present

## 2021-08-20 DIAGNOSIS — E1159 Type 2 diabetes mellitus with other circulatory complications: Secondary | ICD-10-CM | POA: Diagnosis present

## 2021-08-20 DIAGNOSIS — E669 Obesity, unspecified: Secondary | ICD-10-CM | POA: Diagnosis present

## 2021-08-20 DIAGNOSIS — E162 Hypoglycemia, unspecified: Principal | ICD-10-CM

## 2021-08-20 DIAGNOSIS — I152 Hypertension secondary to endocrine disorders: Secondary | ICD-10-CM | POA: Diagnosis present

## 2021-08-20 DIAGNOSIS — I361 Nonrheumatic tricuspid (valve) insufficiency: Secondary | ICD-10-CM | POA: Diagnosis not present

## 2021-08-20 LAB — COMPREHENSIVE METABOLIC PANEL
ALT: 22 U/L (ref 0–44)
AST: 36 U/L (ref 15–41)
Albumin: 3.1 g/dL — ABNORMAL LOW (ref 3.5–5.0)
Alkaline Phosphatase: 100 U/L (ref 38–126)
Anion gap: 5 (ref 5–15)
BUN: 11 mg/dL (ref 6–20)
CO2: 26 mmol/L (ref 22–32)
Calcium: 9.5 mg/dL (ref 8.9–10.3)
Chloride: 113 mmol/L — ABNORMAL HIGH (ref 98–111)
Creatinine, Ser: <0.3 mg/dL — ABNORMAL LOW (ref 0.44–1.00)
Glucose, Bld: 58 mg/dL — ABNORMAL LOW (ref 70–99)
Potassium: 3.5 mmol/L (ref 3.5–5.1)
Sodium: 144 mmol/L (ref 135–145)
Total Bilirubin: 0.8 mg/dL (ref 0.3–1.2)
Total Protein: 6.1 g/dL — ABNORMAL LOW (ref 6.5–8.1)

## 2021-08-20 LAB — CBC WITH DIFFERENTIAL/PLATELET
Abs Immature Granulocytes: 0.01 10*3/uL (ref 0.00–0.07)
Basophils Absolute: 0 10*3/uL (ref 0.0–0.1)
Basophils Relative: 0 %
Eosinophils Absolute: 0.1 10*3/uL (ref 0.0–0.5)
Eosinophils Relative: 1 %
HCT: 27.8 % — ABNORMAL LOW (ref 36.0–46.0)
Hemoglobin: 8.5 g/dL — ABNORMAL LOW (ref 12.0–15.0)
Immature Granulocytes: 0 %
Lymphocytes Relative: 21 %
Lymphs Abs: 1.3 10*3/uL (ref 0.7–4.0)
MCH: 23.7 pg — ABNORMAL LOW (ref 26.0–34.0)
MCHC: 30.6 g/dL (ref 30.0–36.0)
MCV: 77.4 fL — ABNORMAL LOW (ref 80.0–100.0)
Monocytes Absolute: 0.6 10*3/uL (ref 0.1–1.0)
Monocytes Relative: 10 %
Neutro Abs: 4.2 10*3/uL (ref 1.7–7.7)
Neutrophils Relative %: 68 %
Platelets: 235 10*3/uL (ref 150–400)
RBC: 3.59 MIL/uL — ABNORMAL LOW (ref 3.87–5.11)
RDW: 14.7 % (ref 11.5–15.5)
WBC: 6.3 10*3/uL (ref 4.0–10.5)
nRBC: 0 % (ref 0.0–0.2)

## 2021-08-20 LAB — URINE DRUG SCREEN, QUALITATIVE (ARMC ONLY)
Amphetamines, Ur Screen: NOT DETECTED
Barbiturates, Ur Screen: NOT DETECTED
Benzodiazepine, Ur Scrn: NOT DETECTED
Cannabinoid 50 Ng, Ur ~~LOC~~: POSITIVE — AB
Cocaine Metabolite,Ur ~~LOC~~: NOT DETECTED
MDMA (Ecstasy)Ur Screen: NOT DETECTED
Methadone Scn, Ur: NOT DETECTED
Opiate, Ur Screen: NOT DETECTED
Phencyclidine (PCP) Ur S: NOT DETECTED
Tricyclic, Ur Screen: POSITIVE — AB

## 2021-08-20 LAB — PREGNANCY, URINE: Preg Test, Ur: NEGATIVE

## 2021-08-20 LAB — TSH: TSH: <0.01 u[IU]/mL — ABNORMAL LOW (ref 0.350–4.500)

## 2021-08-20 LAB — URINALYSIS, ROUTINE W REFLEX MICROSCOPIC
Bilirubin Urine: NEGATIVE
Glucose, UA: NEGATIVE mg/dL
Hgb urine dipstick: NEGATIVE
Ketones, ur: NEGATIVE mg/dL
Leukocytes,Ua: NEGATIVE
Nitrite: NEGATIVE
Protein, ur: NEGATIVE mg/dL
Specific Gravity, Urine: 1.01 (ref 1.005–1.030)
pH: 6 (ref 5.0–8.0)

## 2021-08-20 LAB — T4, FREE: Free T4: >5.5 ng/dL — ABNORMAL HIGH (ref 0.61–1.12)

## 2021-08-20 LAB — CBG MONITORING, ED
Glucose-Capillary: 135 mg/dL — ABNORMAL HIGH (ref 70–99)
Glucose-Capillary: 40 mg/dL — CL (ref 70–99)
Glucose-Capillary: 57 mg/dL — ABNORMAL LOW (ref 70–99)

## 2021-08-20 LAB — MAGNESIUM: Magnesium: 1.7 mg/dL (ref 1.7–2.4)

## 2021-08-20 LAB — HCG, QUANTITATIVE, PREGNANCY: hCG, Beta Chain, Quant, S: <1 m[IU]/mL (ref <5)

## 2021-08-20 MED ORDER — MAGNESIUM SULFATE 2 GM/50ML IV SOLN
2.0000 g | Freq: Once | INTRAVENOUS | Status: AC
  Administered 2021-08-21: 2 g via INTRAVENOUS
  Filled 2021-08-20: qty 50

## 2021-08-20 MED ORDER — PROPYLTHIOURACIL 50 MG PO TABS
400.0000 mg | ORAL_TABLET | Freq: Once | ORAL | Status: AC
  Administered 2021-08-20: 400 mg via ORAL
  Filled 2021-08-20: qty 8

## 2021-08-20 MED ORDER — METOCLOPRAMIDE HCL 5 MG/ML IJ SOLN
10.0000 mg | Freq: Once | INTRAMUSCULAR | Status: AC
  Administered 2021-08-20: 10 mg via INTRAVENOUS
  Filled 2021-08-20: qty 2

## 2021-08-20 MED ORDER — DEXTROSE 50 % IV SOLN
INTRAVENOUS | Status: AC
  Filled 2021-08-20: qty 50

## 2021-08-20 MED ORDER — HYDROCORTISONE SOD SUC (PF) 100 MG IJ SOLR
300.0000 mg | Freq: Once | INTRAMUSCULAR | Status: AC
  Administered 2021-08-21: 300 mg via INTRAVENOUS
  Filled 2021-08-20: qty 6

## 2021-08-20 MED ORDER — PROPYLTHIOURACIL 50 MG PO TABS
500.0000 mg | ORAL_TABLET | Freq: Once | ORAL | Status: DC
Start: 2021-08-20 — End: 2021-08-20
  Filled 2021-08-20: qty 10

## 2021-08-20 MED ORDER — LOPERAMIDE HCL 2 MG PO CAPS
2.0000 mg | ORAL_CAPSULE | Freq: Once | ORAL | Status: AC
  Administered 2021-08-20: 2 mg via ORAL
  Filled 2021-08-20: qty 1

## 2021-08-20 MED ORDER — DOCUSATE SODIUM 100 MG PO CAPS: 100.0000 mg | ORAL_CAPSULE | Freq: Two times a day (BID) | ORAL | Status: DC | PRN

## 2021-08-20 MED ORDER — POLYETHYLENE GLYCOL 3350 17 G PO PACK: 17.0000 g | PACK | Freq: Every day | ORAL | Status: DC | PRN

## 2021-08-20 MED ORDER — PROPRANOLOL HCL 20 MG PO TABS
10.0000 mg | ORAL_TABLET | Freq: Four times a day (QID) | ORAL | Status: DC
  Administered 2021-08-20: 10 mg via ORAL
  Filled 2021-08-20: qty 1

## 2021-08-20 MED ORDER — ENOXAPARIN SODIUM 40 MG/0.4ML IJ SOSY: 40.0000 mg | PREFILLED_SYRINGE | INTRAMUSCULAR | Status: DC

## 2021-08-20 MED ORDER — PROPYLTHIOURACIL 50 MG PO TABS: 250.0000 mg | ORAL_TABLET | Freq: Three times a day (TID) | ORAL | Status: DC

## 2021-08-20 MED ORDER — PROPYLTHIOURACIL 50 MG PO TABS
200.0000 mg | ORAL_TABLET | ORAL | Status: DC
  Administered 2021-08-21 – 2021-08-22 (×10): 200 mg via ORAL
  Filled 2021-08-20 (×11): qty 4

## 2021-08-20 MED ORDER — PROPYLTHIOURACIL 50 MG PO TABS: 200.0000 mg | ORAL_TABLET | Freq: Three times a day (TID) | ORAL | Status: DC

## 2021-08-20 MED ORDER — SODIUM CHLORIDE 0.9 % IV BOLUS
1000.0000 mL | Freq: Once | INTRAVENOUS | Status: AC
  Administered 2021-08-20: 1000 mL via INTRAVENOUS

## 2021-08-20 MED ORDER — PROPYLTHIOURACIL 50 MG PO TABS: 200.0000 mg | ORAL_TABLET | ORAL | Status: DC

## 2021-08-20 MED ORDER — PROPRANOLOL HCL 1 MG/ML IV SOLN
1.0000 mg | Freq: Once | INTRAVENOUS | Status: AC
  Administered 2021-08-20: 1 mg via INTRAVENOUS
  Filled 2021-08-20: qty 1

## 2021-08-20 MED ORDER — PROPRANOLOL HCL 20 MG PO TABS: 60.0000 mg | ORAL_TABLET | Freq: Four times a day (QID) | ORAL | Status: DC

## 2021-08-20 MED ORDER — DEXTROSE 50 % IV SOLN
1.0000 | Freq: Once | INTRAVENOUS | Status: AC
  Administered 2021-08-20: 50 mL via INTRAVENOUS

## 2021-08-20 NOTE — ED Notes (Signed)
Pt called nurse in room and said said that she thought her Blood sugar was low. Pt's blood sugar was 40. Pt was given Dextrose 50% and a meal. Pt given cranberry juice as well. Pt became diaphoretic at that time. After consuming the juice and receiving the dextrose, pt states she is starting to feel better. Pt is alert and oriented.

## 2021-08-20 NOTE — ED Provider Notes (Signed)
[facility] Provider Note    Event Date/Time   First MD Initiated Contact with Patient 08/20/21 1723     (approximate)   History   Hypoglycemia   HPI  Nancy [name] is a 21 y.o. female  with pmh DM, seizure disorder, CHF, depression who presents with an episode of unresponsiveness.  Patient tells me that she woke up today checked her blood sugar and was 57 she did not take insulin.  She ate 2 peanut butter and jelly sandwiches and then took a sleeping pill.  She woke up on the floor with EMS.  Apparently blood sugar was 35 so she received D10, it went up to 116 and her mental status improved.  Did drop again to 65 and received another 150 mL of D10.  Patient tells me that she has not had any insulin in the last 3 days because her blood sugar has been running low.  She has had ongoing issues with nausea and vomiting and has been relatively p.o. intolerant this is an ongoing issue for her.  She has chronic abdominal with pain which is unchanged today.  She denies any new chest pain or shortness of breath has chronic shortness of breath.     Past Medical History:  Diagnosis Date   Acanthosis nigricans    Anxiety    CHF (congestive heart failure) (HCC)    Chronic lower back pain    Depression    DKA, type 1 (HCC) 09/13/2018   Dyspepsia    Obesity    Ovarian cyst    pt is not aware of this hx (11/24/2017)   Precocious adrenarche (HCC)    Premature baby    Seizures (HCC)    Type II diabetes mellitus (HCC)    insulin dependant    Patient Active Problem List   Diagnosis Date Noted   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   Microcytic anemia 02/12/2021   Uncontrolled type 1 diabetes mellitus with hyperglycemia, with long-term current use of insulin (HCC) 02/12/2021   Left inguinal abscess 02/12/2021   Asymptomatic bacteriuria 02/12/2021   Nicotine dependence 02/12/2021   Abdominal pain 02/22/2020   Chronic constipation 02/22/2020   Nausea & vomiting  02/22/2020   SIRS (systemic inflammatory response syndrome) (HCC) 01/14/2020   Diabetic gastroparesis (HCC) 11/25/2019   Polysubstance abuse (HCC) 11/25/2019   Gastritis 11/25/2019   DKA, type 1 (HCC) 11/24/2019   Cyclical vomiting with nausea 10/01/2019   Intractable nausea and vomiting 09/30/2019   Cellulitis    DKA (diabetic ketoacidoses) 01/27/2019   Leukocytosis 12/19/2018   Dehydration    Pseudoseizures (HCC)    Type 1 diabetes mellitus with ketoacidosis without coma (HCC) 11/28/2018   Hyperglycemia 11/27/2018   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   MDD (major depressive disorder), recurrent episode, severe (HCC) 11/06/2018   Nonspecific abnormal electrocardiogram (ECG) (EKG)    Chest pain of uncertain etiology    Hypertensive urgency 10/08/2018   Conversion disorder with attacks or seizures, acute episode, with psychological stressor 09/16/2018   MDD (major depressive disorder), recurrent severe, without psychosis (HCC)    Hypokalemia 08/04/2018   AKI (acute kidney injury) (HCC) 08/04/2018   Seizure-like activity (HCC) 08/03/2018   Depression 07/25/2018   Syncope 01/30/2018   Orthostatic hypotension 01/24/2018   Tachycardia 12/28/2017   Chronic abdominal pain 12/24/2017   Chest pain 12/19/2017   Nausea and vomiting 08/21/2017   Generalized abdominal pain 08/21/2017   Non compliance with medical  treatment 01/27/2012   Adjustment disorder 09/16/2011   Acanthosis nigricans    Goiter    Obesity 06/14/2010   Hypertension 06/14/2010     Physical Exam  Triage Vital Signs: ED Triage Vitals  Enc Vitals Group     BP 08/20/21 1712 (!) 159/85     Pulse Rate 08/20/21 1712 (!) 130     Resp 08/20/21 1712 15     Temp 08/20/21 1712 98.3 F (36.8 C)     Temp src --      SpO2 08/20/21 1712 100 %     Weight --      Height --      Head Circumference --      Peak Flow --      Pain Score 08/20/21 1706 8     Pain Loc --      Pain  Edu? --      Excl. in GC? --     Most recent vital signs: Vitals:   08/20/21 2030 08/20/21 2243  BP: (!) 151/82 (!) 140/60  Pulse: (!) 143 (!) 135  Resp: (!) 24 (!) 24  Temp:  98.3 F (36.8 C)  SpO2: 100% 100%     General: Awake, no distress.  CV:  Good peripheral perfusion.  Resp:  Normal effort.  Abd:  No distention.  Abdomen is tender throughout but soft Neuro:             Awake, Alert, Oriented x 3  Other:  Aox3, nml speech  Patient has fine tremor in her upper extremities PERRL, EOMI, face symmetric, nml tongue movement  5/5 strength in the BL upper and lower extremities  Sensation grossly intact in the BL upper and lower extremities  Finger-nose-finger intact BL    ED Results / Procedures / Treatments  Labs (all labs ordered are listed, but only abnormal results are displayed) Labs Reviewed  URINALYSIS, ROUTINE W REFLEX MICROSCOPIC - Abnormal; Notable for the following components:      Result Value   Color, Urine YELLOW (*)    APPearance CLOUDY (*)    All other components within normal limits  COMPREHENSIVE METABOLIC PANEL - Abnormal; Notable for the following components:   Chloride 113 (*)    Glucose, Bld 58 (*)    Creatinine, Ser <0.30 (*)    Total Protein 6.1 (*)    Albumin 3.1 (*)    All other components within normal limits  CBC WITH DIFFERENTIAL/PLATELET - Abnormal; Notable for the following components:   RBC 3.59 (*)    Hemoglobin 8.5 (*)    HCT 27.8 (*)    MCV 77.4 (*)    MCH 23.7 (*)    All other components within normal limits  TSH - Abnormal; Notable for the following components:   TSH <0.010 (*)    All other components within normal limits  T4, FREE - Abnormal; Notable for the following components:   Free T4 >5.50 (*)    All other components within normal limits  URINE DRUG SCREEN, QUALITATIVE ([facility] ONLY) - Abnormal; Notable for the following components:   Tricyclic, Ur Screen POSITIVE (*)    Cannabinoid 50 Ng, Ur [place] POSITIVE (*)    All  other components within normal limits  CBG MONITORING, ED - Abnormal; Notable for the following components:   Glucose-Capillary 57 (*)    All other components within normal limits  CBG MONITORING, ED - Abnormal; Notable for the following components:   Glucose-Capillary 135 (*)    All  other components within normal limits  CBG MONITORING, ED - Abnormal; Notable for the following components:   Glucose-Capillary 40 (*)    All other components within normal limits  MAGNESIUM  PREGNANCY, URINE  HCG, QUANTITATIVE, PREGNANCY  T3, FREE  URINALYSIS, ROUTINE W REFLEX MICROSCOPIC  CBC  BASIC METABOLIC PANEL  MAGNESIUM  PHOSPHORUS  BRAIN NATRIURETIC PEPTIDE  THYROID ANTIBODIES  CBG MONITORING, ED  POC URINE PREG, ED     EKG  EKG interpretation performed by myself: sinus tach, nml axis, nml intervals, no acute ischemic changes    RADIOLOGY    PROCEDURES:  Critical Care performed: Yes, see critical care procedure note(s)  .1-3 Lead EKG Interpretation  Performed by: Georga Hacking, MD Authorized by: Georga Hacking, MD     Interpretation: normal     ECG rate assessment: tachycardic     Rhythm: sinus tachycardia     Ectopy: none   .Critical Care  Performed by: Georga Hacking, MD Authorized by: Georga Hacking, MD   Critical care provider statement:    Critical care time (minutes):  100   Critical care was time spent personally by me on the following activities:  Development of treatment plan with patient or surrogate, discussions with consultants, evaluation of patient's response to treatment, examination of patient, ordering and review of laboratory studies, ordering and review of radiographic studies, ordering and performing treatments and interventions, pulse oximetry, re-evaluation of patient's condition and review of old charts   The patient is on the cardiac monitor to evaluate for evidence of arrhythmia and/or significant heart rate  changes.   MEDICATIONS ORDERED IN ED: Medications  hydrocortisone sodium succinate (SOLU-CORTEF) 100 MG injection 300 mg (has no administration in time range)  docusate sodium (COLACE) capsule 100 mg (has no administration in time range)  polyethylene glycol (MIRALAX / GLYCOLAX) packet 17 g (has no administration in time range)  enoxaparin (LOVENOX) injection 40 mg (has no administration in time range)  magnesium sulfate IVPB 2 g 50 mL (has no administration in time range)  propranolol (INDERAL) tablet 60 mg (has no administration in time range)  propylthiouracil (PTU) tablet 200 mg (has no administration in time range)  Iodine Strong (Lugols) 5 % solution 0.5 mL (has no administration in time range)  dextrose 50 % solution (has no administration in time range)  sodium chloride 0.9 % bolus 1,000 mL (0 mLs Intravenous Stopped 08/20/21 2253)  metoCLOPramide (REGLAN) injection 10 mg (10 mg Intravenous Given 08/20/21 1909)  propranolol (INDERAL) injection 1 mg (1 mg Intravenous Given 08/20/21 2024)  loperamide (IMODIUM) capsule 2 mg (2 mg Oral Given 08/20/21 2104)  propylthiouracil (PTU) tablet 400 mg (400 mg Oral Given 08/20/21 2250)  dextrose 50 % solution 50 mL (50 mLs Intravenous Given 08/20/21 2340)     IMPRESSION / MDM / ASSESSMENT AND PLAN / ED COURSE  I reviewed the triage vital signs and the nursing notes.                              Patient's presentation is most consistent with acute presentation with potential threat to life or bodily function.  Differential diagnosis includes, but is not limited to, hypoglycemia from malnutrition, hypothyroidism, adrenal insufficiency, medication side effect, seizure  The patient is a 21 year old female with multiple medical comorbidities occluding diabetes and seizures presents after an episode of unresponsiveness.  She woke up and had relatively low blood sugar and ate and  went back to sleep.  Found on the floor minimally responsive.  Per EMS  she was groaning with blood sugar 35 received 100 ml D10 blood sugar improved to 116 but then did drop again to 65 so received another 150 of D10.  Arrival to the ED blood sugar 57.  Patient is notably quite tachycardic up to 145 when I am in the room with her she is hypertensive as well vitals otherwise within normal limits.  She does have a fine tremor but the rest of her neurologic exam is nonfocal.  EKG showing sinus tach.  We will give a liter of fluid and Reglan.  Patient is eating and tolerating p.o.  Check labs including CBC CMP and thyroid studies given significant tachycardia and tremor.  Will need to monitor blood sugar while in the ED.  I suspect patient will need admission unless her vital significantly normalized and she has persistently normal blood glucose.   Labs are notable for significant low TSH and elevated free T4.  Together with her tachycardia tremulousness diarrhea and suspicious for severe hypothyroidism.  She is not hyperthermic and not agitated mentating normally so do not necessarily think this is thyroid storm.  Will initiate propranolol and PTU.  Attempted to reach out to both [facility] and [facility] to speak to an endocrinologist to see if the patient would benefit from transfer unfortunately they are full and I am not able to even talk to endocrine for consult.  There is no endocrine at [facility] either.  [facility] and spoke with the endocrine fellow who says that this is suspicious for impending thyroid storm and recommends treating this as thyroid storm however there is not any bed availability here.  I have thus discussed with Dr. Francine Graven the intensivist at [facility] he will take the patient.  Did have recurrent hypoglycemia with sugars in the 40s.  I am starting 300 mg of Solu-Cortef much should help with the hypoglycemia as well. Clinical Course as of 08/21/21 0029  Tue Aug 20, 2021  1855 Hemoglobin(!): 8.5 Near baseline [KM]    Clinical Course User Index [KM] Georga Hacking, MD     FINAL CLINICAL IMPRESSION(S) / ED DIAGNOSES   Final diagnoses:  Hypoglycemia  Thyrotoxicosis with thyrotoxic crisis, unspecified thyrotoxicosis type     Rx / DC Orders   ED Discharge Orders     None        Note:  This document was prepared using Dragon voice recognition software and may include unintentional dictation errors.   Georga Hacking, MD 08/21/21 [phone]

## 2021-08-20 NOTE — ED Notes (Signed)
Pt wheeled back to rm [facility] at this time. Toileting offered to pt. Pt abel to urinate at this time. Urine sample obtained. Pt placed on cardiac monitor. EKG obtained. Call light within reach. Pt has no further needs at this time.

## 2021-08-20 NOTE — ED Triage Notes (Signed)
Patient arrived by [place] from home. Family found patient face down unresponsive on floor. Hx diabetes, epileptic, and CHF.  EMS reports when they arrived she was moaning and became responsive to CBG of 116.   EMS reports 35 blood sugar that rose to 116 after D10.  Dropped to 65 blood sugar and received D10 and rose to 95.  EMS vitals: 20G L AC 170/90 b/p 99% RA 120 HR

## 2021-08-20 NOTE — H&P (Incomplete)
NAME:  [name], MRN:  [id number], DOB:  [birth year], LOS: 0 ADMISSION DATE:  08/20/2021, CONSULTATION DATE:  08/20/21 REFERRING MD:  Augusto Gamble, MD CHIEF COMPLAINT:  Hypoglycemia  History of Present Illness:  Nancy [name] is a 21 year old woman with DMI, seizure disorder and congestive heart failure who presented to the ER after being found unresponsive at home. She reports low blood sugars over recent days despite not using her insulin. EMS was called after she was found by family. Blood sugar was 35 on arrival and she was started on D10 infusion. She reports abdominal pain, nausea, vomiting and diarrhea. She reports racing heart and diaphoresis. She also has a tremor of her hands.  In the ER her blood glucose continued to require D50 pushes. She was tachycardic into the 140s-150s. Labs showed TSH <0.010 and Free T4 >5.5.   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  Pertinent  Medical History   Past Medical History:  Diagnosis Date   Acanthosis nigricans    Anxiety    CHF (congestive heart failure) (HCC)    Chronic lower back pain    Depression    DKA, type 1 (HCC) 09/13/2018   Dyspepsia    Obesity    Ovarian cyst    pt is not aware of this hx (11/24/2017)   Precocious adrenarche (HCC)    Premature baby    Seizures (HCC)    Type II diabetes mellitus (HCC)    insulin dependant   Significant Hospital Events: Including procedures, antibiotic start and stop dates in addition to other pertinent events   8/16 admitted to ICU for thyroid storm  Interim History / Subjective:   She was given 10mg  propranolol PO in the ER along with 400mg  PTU and 300mg  IV hydrocortisone.   Objective   Blood pressure (!) 140/60, pulse (!) 135, temperature 98.3 F (36.8 C), temperature source Oral, resp. rate (!) 24, last menstrual period 07/02/2021, SpO2 100 %.       No intake or output data in the 24 hours ending 08/20/21 2327 There were no vitals filed for this  visit.  Examination: General: young woman, diaphoretic, moderate distress HENT: [place]/AT, moist mucous membranes, sclera anicteric Lungs: clear to auscultation, no wheezing Cardiovascular: tachcyardic, s1s2, no murmurs Abdomen: soft, non-tender, non-distended Extremities: warm, 2+ pretibial edema  Neuro: alert, oriented, moving all extremities GU: n/a  CXR: cardiomegaly, bilateral pulmonary edema  Resolved Hospital Problem list     Assessment & Plan:   Thyroid Storm Congestive Heart Failure Pulmonary Edema Severe Hypoglycemia Chronic Anemia Acute Metabolic Encephalopathy - improved Diabetes Mellitus  Plan: - She received PTU 400mg  in ER. Continue 200mg  q4hrs. - Start propranolol 60mg  q4hrs, will titrate up to 80 if needed based on HR and BP. - Start Lugol's iodine solution 0.59mL q8hrs (give first dose at least 1 hour after first PTU dose) - Given 300mg  hydrocortisone in ER. Continue decadron 4mg  q8hrs.  - Start D10 at 64mL/hr and PRN pushes of D50 as needed. - Check ECHO and BNP - Check thyroid 2328 - Check thyroid antibodies - Check UA - Blood glucose checks q1hr, may require SSI with steroids  Best Practice (right click and "Reselect all SmartList Selections" daily)   Diet/type: Regular consistency (see orders) DVT prophylaxis: LMWH GI prophylaxis: N/A Lines: N/A Foley:  N/A Code Status:  full code Last date of multidisciplinary goals of care discussion [8/15 spoke with patient]  Labs   CBC: Recent Labs  Lab 08/20/21 1804  WBC 6.3  NEUTROABS 4.2  HGB 8.5*  HCT 27.8*  MCV 77.4*  PLT 235    Basic Metabolic Panel: Recent Labs  Lab 08/20/21 1804  NA 144  K 3.5  CL 113*  CO2 26  GLUCOSE 58*  BUN 11  CREATININE <0.30*  CALCIUM 9.5  MG 1.7   GFR: CrCl cannot be calculated (This lab value cannot be used to calculate CrCl because it is not a number: <0.30). Recent Labs  Lab 08/20/21 1804  WBC 6.3    Liver Function Tests: Recent Labs  Lab  08/20/21 1804  AST 36  ALT 22  ALKPHOS 100  BILITOT 0.8  PROT 6.1*  ALBUMIN 3.1*   No results for input(s): "LIPASE", "AMYLASE" in the last 168 hours. No results for input(s): "AMMONIA" in the last 168 hours.  ABG    Component Value Date/Time   PHART 7.402 12/01/2018 0120   PCO2ART 34.2 12/01/2018 0120   PO2ART 84.0 12/01/2018 0120   HCO3 28.4 (H) 06/28/2021 1117   TCO2 29 04/10/2020 1105   ACIDBASEDEF 4.4 (H) 08/06/2019 1335   O2SAT 91.4 06/28/2021 1117     Coagulation Profile: No results for input(s): "INR", "PROTIME" in the last 168 hours.  Cardiac Enzymes: No results for input(s): "CKTOTAL", "CKMB", "CKMBINDEX", "TROPONINI" in the last 168 hours.  HbA1C: Hgb A1c MFr Bld  Date/Time Value Ref Range Status  02/13/2021 03:28 AM 6.7 (H) 4.8 - 5.6 % Final    Comment:    (NOTE) Pre diabetes:          5.7%-6.4%  Diabetes:              >6.4%  Glycemic control for   <7.0% adults with diabetes   02/24/2020 10:30 AM 11.5 (H) 4.8 - 5.6 % Final    Comment:    (NOTE) Pre diabetes:          5.7%-6.4%  Diabetes:              >6.4%  Glycemic control for   <7.0% adults with diabetes     CBG: Recent Labs  Lab 08/20/21 1706 08/20/21 1917  GLUCAP 57* 135*    Review of Systems:   Review of Systems  Constitutional:  Positive for diaphoresis. Negative for chills, fever, malaise/fatigue and weight loss.  HENT:  Negative for congestion, sinus pain and sore throat.   Eyes: Negative.   Respiratory:  Negative for cough, hemoptysis, sputum production, shortness of breath and wheezing.   Cardiovascular:  Positive for palpitations and leg swelling. Negative for chest pain, orthopnea and claudication.  Gastrointestinal:  Positive for abdominal pain, diarrhea and nausea. Negative for heartburn and vomiting.  Genitourinary: Negative.   Musculoskeletal:  Negative for joint pain and myalgias.  Skin:  Negative for rash.  Neurological:  Positive for tremors. Negative for  weakness.  Endo/Heme/Allergies: Negative.   Psychiatric/Behavioral: Negative.     Past Medical History:  She,  has a past medical history of Acanthosis nigricans, Anxiety, CHF (congestive heart failure) (HCC), Chronic lower back pain, Depression, DKA, type 1 (HCC) (09/13/2018), Dyspepsia, Obesity, Ovarian cyst, Precocious adrenarche (HCC), Premature baby, Seizures (HCC), and Type II diabetes mellitus (HCC).   Surgical History:   Past Surgical History:  Procedure Laterality Date   ABDOMINAL HERNIA REPAIR     "I was a baby"   BIOPSY  10/12/2018   Procedure: BIOPSY;  Surgeon: Lynann Bologna, MD;  Location: [facility] ENDOSCOPY;  Service: Endoscopy;;   BIOPSY  02/28/2020   Procedure:  BIOPSY;  Surgeon: Shellia Cleverly, DO;  Location: [facility] ENDOSCOPY;  Service: Gastroenterology;;   ESOPHAGOGASTRODUODENOSCOPY (EGD) WITH PROPOFOL N/A 10/12/2018   Procedure: ESOPHAGOGASTRODUODENOSCOPY (EGD) WITH PROPOFOL;  Surgeon: Lynann Bologna, MD;  Location[facility] ENDOSCOPY;  Service: Endoscopy;  Laterality: N/A;   ESOPHAGOGASTRODUODENOSCOPY (EGD) WITH PROPOFOL N/A 02/28/2020   Procedure: ESOPHAGOGASTRODUODENOSCOPY (EGD) WITH PROPOFOL;  Surgeon: Shellia Cleverly, DO;  Location: [facility] ENDOSCOPY;  Service: Gastroenterology;  Laterality: N/A;   FLEXIBLE SIGMOIDOSCOPY N/A 02/28/2020   Procedure: FLEXIBLE SIGMOIDOSCOPY;  Surgeon: Shellia Cleverly, DO;  Location: [facility] ENDOSCOPY;  Service: Gastroenterology;  Laterality: N/A;   HERNIA REPAIR     LEFT HEART CATH AND CORONARY ANGIOGRAPHY N/A 10/13/2018   Procedure: LEFT HEART CATH AND CORONARY ANGIOGRAPHY;  Surgeon: Kathleene Hazel, MD;  Location: [facility] INVASIVE CV LAB;  Service: Cardiovascular;  Laterality: N/A;   TONSILLECTOMY AND ADENOIDECTOMY     WISDOM TOOTH EXTRACTION  2017     Social History:   reports that she has never smoked. She has never used smokeless tobacco. She reports that she does not drink alcohol and does not use drugs.   Family History:  Her family history  includes Allergic rhinitis in her mother; Asthma in her mother; Cervical cancer in her mother; Colon cancer in her maternal grandfather; Diabetes in her father, mother, paternal grandfather, and paternal grandmother; Eczema in her mother; Hyperlipidemia in her father; Hypertension in her father, maternal grandfather, mother, and paternal aunt; Obesity in her father, mother, paternal grandfather, and paternal grandmother. There is no history of Angioedema, Immunodeficiency, Urticaria, Stomach cancer, or Esophageal cancer.   Allergies Allergies  Allergen Reactions   Oatmeal Anaphylaxis   Tomato Anaphylaxis   Ibuprofen Other (See Comments)    GI MD said to not take this anymore     Home Medications  Prior to Admission medications   Medication Sig Start Date End Date Taking? Authorizing Provider  metoCLOPramide (REGLAN) 10 MG tablet Take 1 tablet (10 mg total) by mouth 3 (three) times daily with meals. 05/07/21 05/07/22 Yes Loleta [name], MD  ARIPiprazole (ABILIFY) 10 MG tablet Take 10 mg by mouth in the morning and at bedtime.  Patient not taking: Reported on 06/02/2021    [provider]  atorvastatin (LIPITOR) 20 MG tablet Take 1 tablet (20 mg total) by mouth daily at 6 PM. Patient not taking: Reported on 08/20/2021 10/05/18   Storm Frisk, MD  capsaicin (ZOSTRIX) 0.025 % cream Apply topically 2 (two) times daily. Patient not taking: Reported on 06/02/2021 02/29/20   Maretta Bees, MD  diclofenac Sodium (VOLTAREN) 1 % GEL Apply 1 application topically 2 (two) times daily as needed (chest pains). Patient not taking: Reported on 06/02/2021    [provider]  famotidine (PEPCID) 20 MG tablet Take 1 tablet (20 mg total) by mouth 2 (two) times daily. Patient not taking: Reported on 08/20/2021 07/12/21   Sharman Cheek, MD  insulin aspart (NOVOLOG FLEXPEN) 100 UNIT/ML FlexPen Inject 15 Units into the skin 2 (two) times daily before a meal. Patient not taking: Reported on  06/02/2021    [provider]  insulin glargine (LANTUS) 100 unit/mL SOPN Inject 30 Units into the skin at bedtime. Patient not taking: Reported on 06/02/2021 02/29/20   Maretta Bees, MD  losartan (COZAAR) 25 MG tablet Take 25 mg by mouth daily. Patient not taking: Reported on 08/20/2021 05/20/21   [provider]  Multiple Vitamin (MULTIVITAMIN WITH MINERALS) TABS tablet Take 1 tablet by mouth daily.  Patient not taking: Reported on 06/02/2021 09/18/18   Clapacs, Jackquline Denmark, MD  ondansetron (ZOFRAN-ODT) 4 MG disintegrating tablet Take 1 tablet (4 mg total) by mouth every 8 (eight) hours as needed. 07/16/21   Delton Prairie, MD  promethazine (PHENERGAN) 25 MG suppository Place 1 suppository (25 mg total) rectally every 8 (eight) hours as needed for nausea or vomiting. 06/30/21   Phineas Semen, MD  QUEtiapine (SEROQUEL) 100 MG tablet Take 300 mg by mouth at bedtime. Patient not taking: Reported on 08/20/2021 07/05/19   [provider]  QUEtiapine (SEROQUEL) 50 MG tablet Take 50 mg by mouth 2 (two) times daily. Patient not taking: Reported on 03/07/2021 12/13/20   [provider]  prochlorperazine (COMPAZINE) 25 MG suppository PLACE 1 SUPPOSITORY (25 MG TOTAL) RECTALLY EVERY TWELVE HOURS AS NEEDED FOR NAUSEA OR VOMITING. 02/29/20 02/29/20  Maretta Bees, MD     Critical care time: 45 minutes   Melody Comas, MD [place] Office: [phone]   See Amion for personal pager PCCM on call pager [phone] until 7pm. Please call Elink 7p-7a. [phone]

## 2021-08-21 ENCOUNTER — Inpatient Hospital Stay: Payer: Medicaid Other

## 2021-08-21 DIAGNOSIS — E0581 Other thyrotoxicosis with thyrotoxic crisis or storm: Secondary | ICD-10-CM | POA: Diagnosis not present

## 2021-08-21 LAB — BASIC METABOLIC PANEL
Anion gap: 6 (ref 5–15)
BUN: 9 mg/dL (ref 6–20)
CO2: 24 mmol/L (ref 22–32)
Calcium: 9.4 mg/dL (ref 8.9–10.3)
Chloride: 108 mmol/L (ref 98–111)
Creatinine, Ser: <0.3 mg/dL — ABNORMAL LOW (ref 0.44–1.00)
Glucose, Bld: 107 mg/dL — ABNORMAL HIGH (ref 70–99)
Potassium: 3.9 mmol/L (ref 3.5–5.1)
Sodium: 138 mmol/L (ref 135–145)

## 2021-08-21 LAB — CBC
HCT: 25.6 % — ABNORMAL LOW (ref 36.0–46.0)
Hemoglobin: 8.1 g/dL — ABNORMAL LOW (ref 12.0–15.0)
MCH: 23.5 pg — ABNORMAL LOW (ref 26.0–34.0)
MCHC: 31.6 g/dL (ref 30.0–36.0)
MCV: 74.4 fL — ABNORMAL LOW (ref 80.0–100.0)
Platelets: 233 10*3/uL (ref 150–400)
RBC: 3.44 MIL/uL — ABNORMAL LOW (ref 3.87–5.11)
RDW: 14.5 % (ref 11.5–15.5)
WBC: 7.8 10*3/uL (ref 4.0–10.5)
nRBC: 0 % (ref 0.0–0.2)

## 2021-08-21 LAB — GLUCOSE, CAPILLARY
Glucose-Capillary: 100 mg/dL — ABNORMAL HIGH (ref 70–99)
Glucose-Capillary: 120 mg/dL — ABNORMAL HIGH (ref 70–99)
Glucose-Capillary: 122 mg/dL — ABNORMAL HIGH (ref 70–99)
Glucose-Capillary: 146 mg/dL — ABNORMAL HIGH (ref 70–99)
Glucose-Capillary: 200 mg/dL — ABNORMAL HIGH (ref 70–99)
Glucose-Capillary: 216 mg/dL — ABNORMAL HIGH (ref 70–99)
Glucose-Capillary: 223 mg/dL — ABNORMAL HIGH (ref 70–99)
Glucose-Capillary: 289 mg/dL — ABNORMAL HIGH (ref 70–99)
Glucose-Capillary: 293 mg/dL — ABNORMAL HIGH (ref 70–99)
Glucose-Capillary: 327 mg/dL — ABNORMAL HIGH (ref 70–99)
Glucose-Capillary: 327 mg/dL — ABNORMAL HIGH (ref 70–99)
Glucose-Capillary: 56 mg/dL — ABNORMAL LOW (ref 70–99)
Glucose-Capillary: 66 mg/dL — ABNORMAL LOW (ref 70–99)
Glucose-Capillary: 85 mg/dL (ref 70–99)

## 2021-08-21 LAB — HEMOGLOBIN A1C
Hgb A1c MFr Bld: 6.4 % — ABNORMAL HIGH (ref 4.8–5.6)
Mean Plasma Glucose: 136.98 mg/dL

## 2021-08-21 LAB — CBG MONITORING, ED: Glucose-Capillary: 94 mg/dL (ref 70–99)

## 2021-08-21 LAB — LACTIC ACID, PLASMA
Lactic Acid, Venous: 1.8 mmol/L (ref 0.5–1.9)
Lactic Acid, Venous: 2 mmol/L (ref 0.5–1.9)

## 2021-08-21 LAB — MAGNESIUM: Magnesium: 2.1 mg/dL (ref 1.7–2.4)

## 2021-08-21 LAB — PHOSPHORUS: Phosphorus: 4.5 mg/dL (ref 2.5–4.6)

## 2021-08-21 LAB — BRAIN NATRIURETIC PEPTIDE: B Natriuretic Peptide: 425.9 pg/mL — ABNORMAL HIGH (ref 0.0–100.0)

## 2021-08-21 IMAGING — CT CT ABD-PELV W/ CM
2 of 4 series · 17 of 46 positions shown, 19 images · IV contrast (OMNIPAQUE 350)
Comparison: 09/30/2019

CLINICAL DATA: Abdominal pain

EXAM:
CT ABDOMEN AND PELVIS WITH CONTRAST
TECHNIQUE: Multidetector CT imaging of the abdomen and pelvis was performed
using the standard protocol following bolus administration of
intravenous contrast.
CONTRAST:  80mL OMNIPAQUE IOHEXOL 350 MG/ML SOLN

[Series 2: axial st · axial · 0.82mm/px · z∈[-533,-113]mm · 14 of 96 slices shown, 16 images]
[im 6/96  soft-tissue]
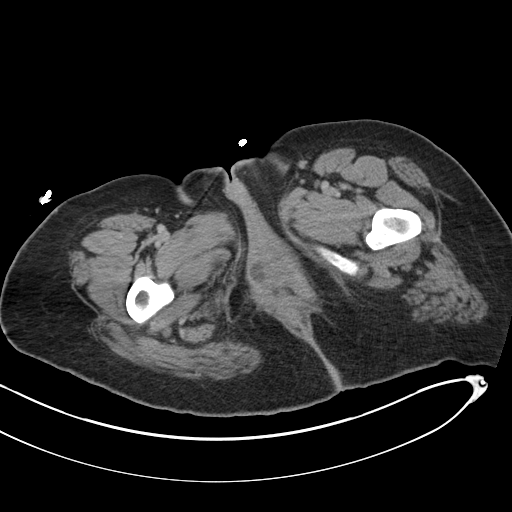
[im 6/96  bone]
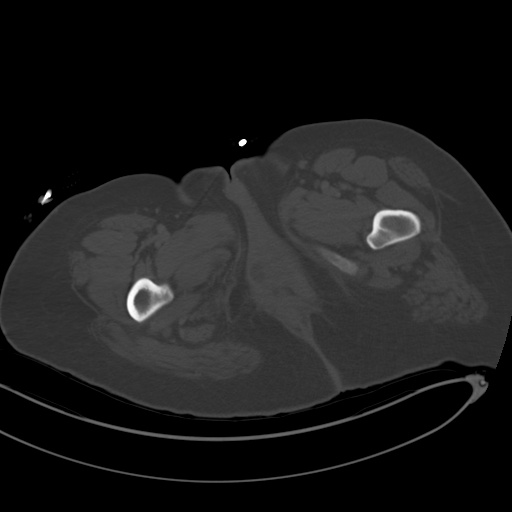
[im 12/96  soft-tissue]
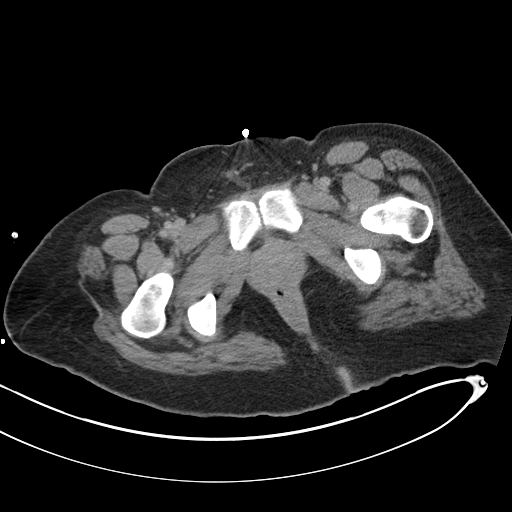
[im 17/96  soft-tissue]
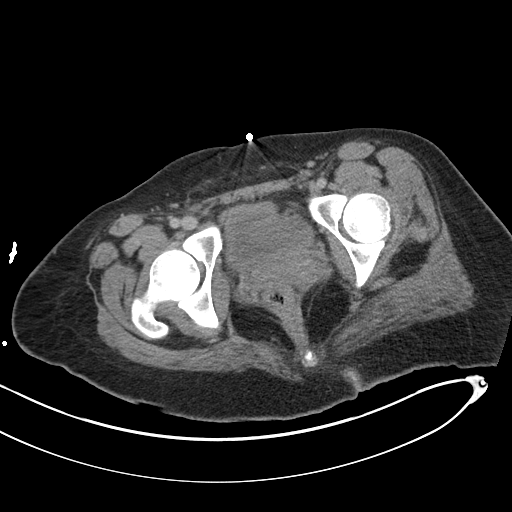
[im 28/96  soft-tissue]
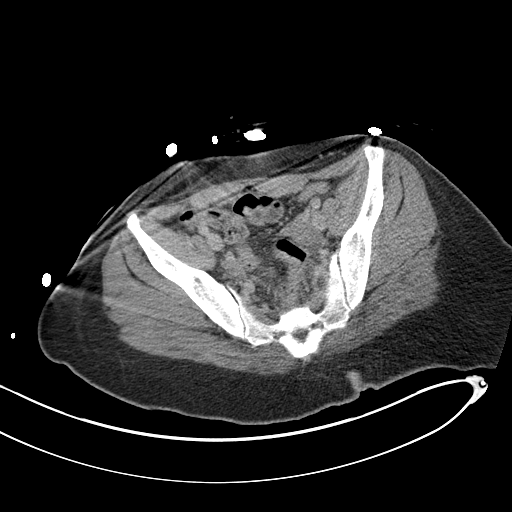
[im 34/96  soft-tissue]
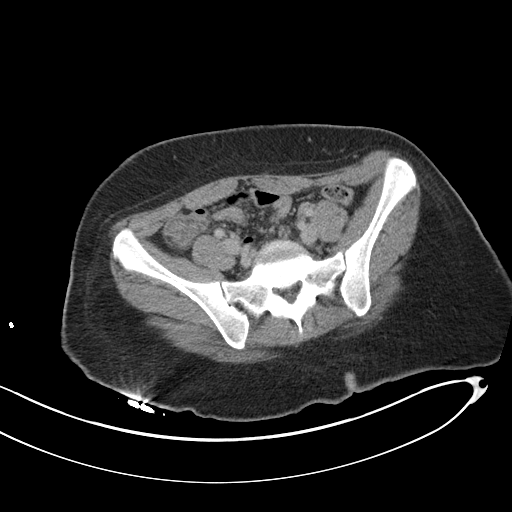
[im 40/96  soft-tissue]
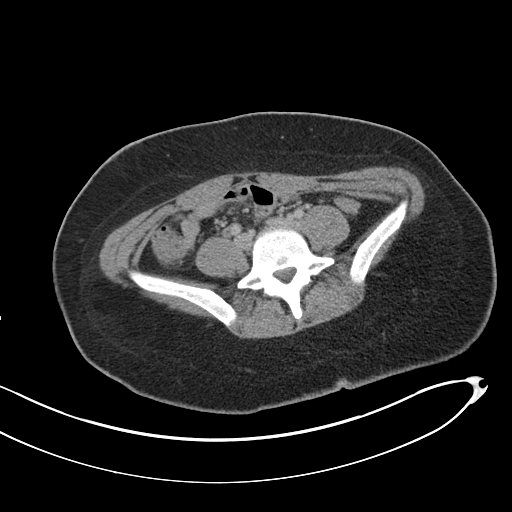
[im 45/96  soft-tissue]
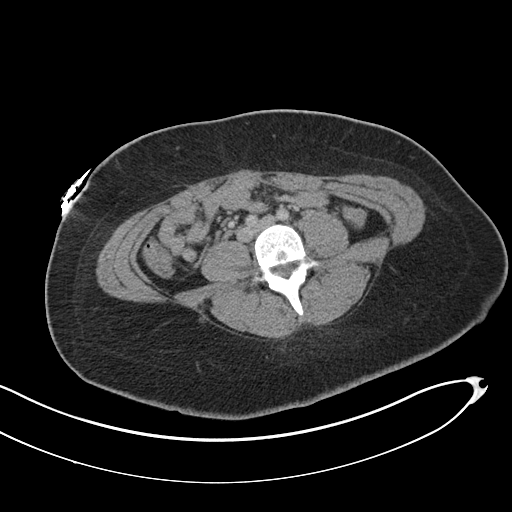
[im 51/96  soft-tissue]
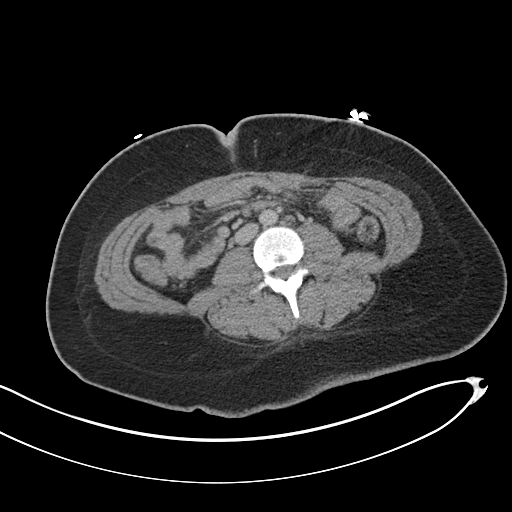
[im 56/96  soft-tissue]
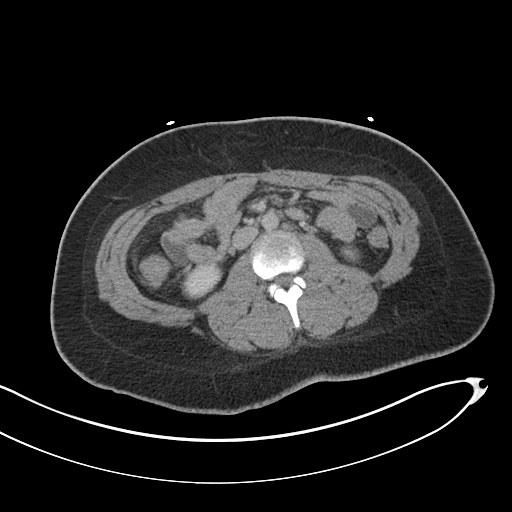
[im 56/96  bone]
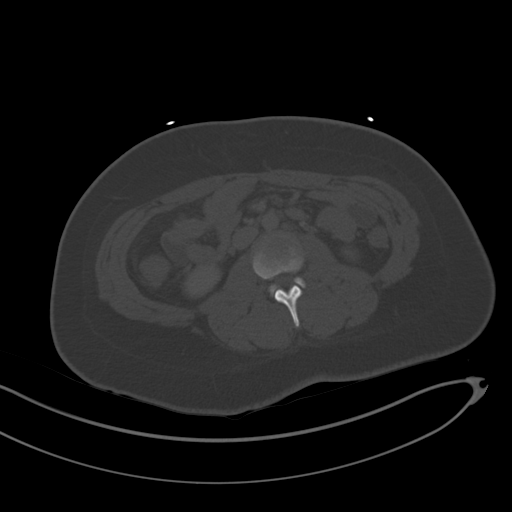
[im 62/96  soft-tissue]
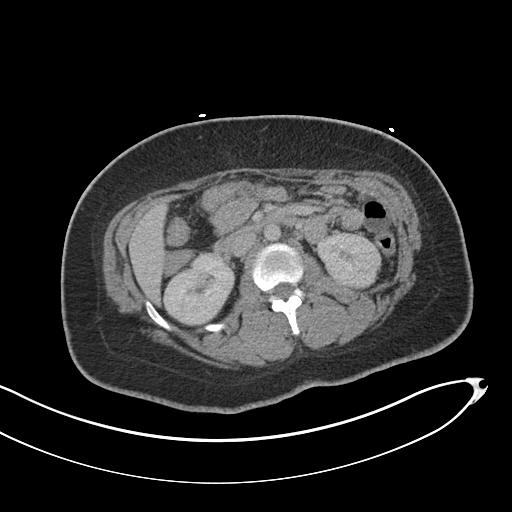
[im 73/96  soft-tissue]
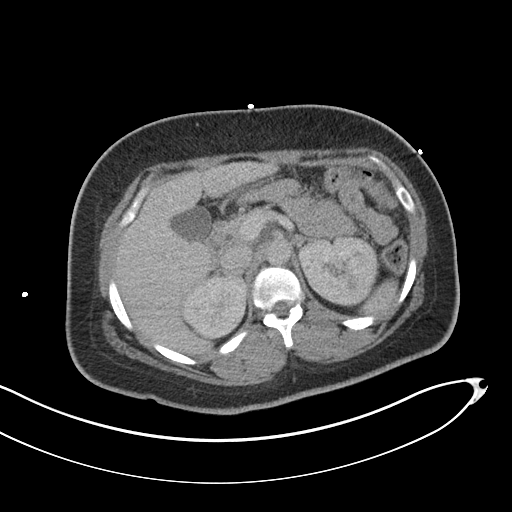
[im 79/96  soft-tissue]
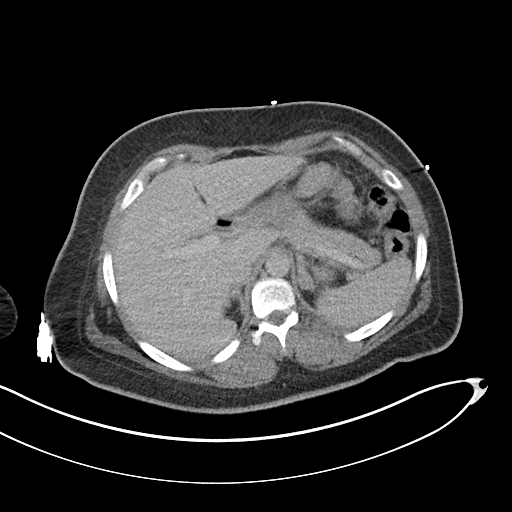
[im 84/96  soft-tissue]
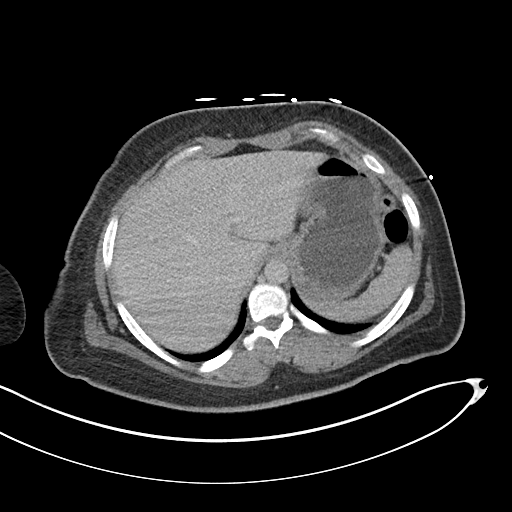
[im 90/96  soft-tissue]
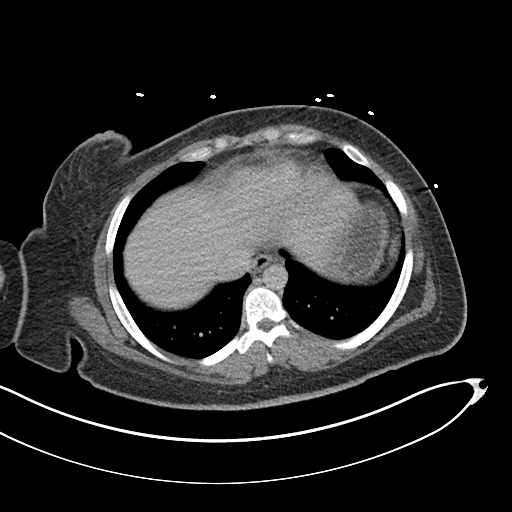

[Series 4: coronal st · coronal · 0.91mm/px · 3 of 123 slices shown]
[im 41/123  soft-tissue]
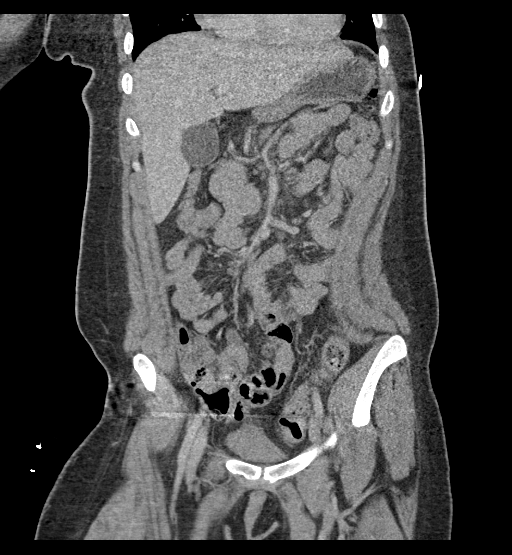
[im 55/123  soft-tissue]
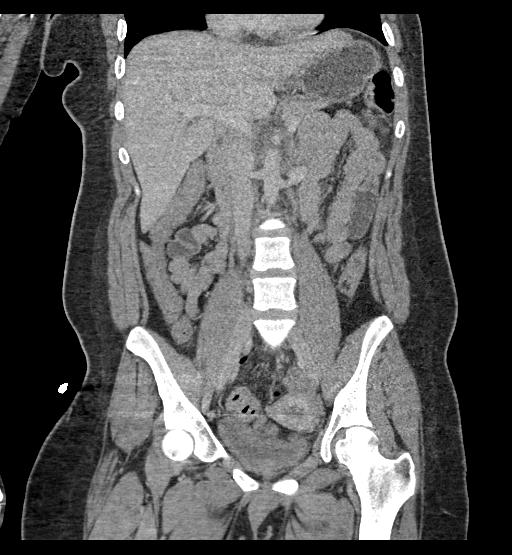
[im 68/123  soft-tissue]
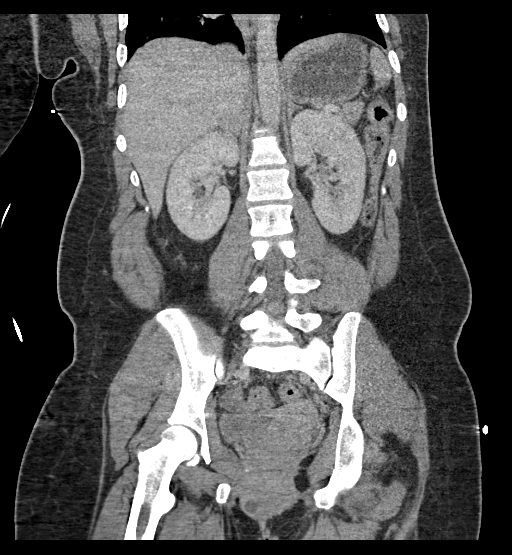

[17 of 46 positions shown; findings below may reference images not displayed]

FINDINGS: Lower chest: Lung bases are clear.

Hepatobiliary: Liver is within normal limits.

Gallbladder is unremarkable. No intrahepatic or extrahepatic ductal
dilatation.

Pancreas: Within normal limits.

Spleen: Within normal limits.

Adrenals/Urinary Tract: 13 mm left adrenal nodule (series 2/image
18), unchanged, likely reflecting a benign adrenal adenoma. Right
adrenal gland is within normal limits.

Kidneys are within normal limits.  No hydronephrosis.

Bladder is underdistended but unremarkable.

Stomach/Bowel: Stomach is within normal limits.

No evidence of bowel obstruction.

Normal appendix (series 2/image 66).

No colonic wall thickening or inflammatory changes.

Vascular/Lymphatic: No evidence of abdominal aortic aneurysm.

No suspicious abdominopelvic lymphadenopathy.

Reproductive: Uterus is within normal limits.

Bilateral ovaries are within normal limits.

Other: No abdominopelvic ascites.

Musculoskeletal: Visualized osseous structures are within normal
limits.
IMPRESSION: Negative CT abdomen/pelvis.

Stable 13 mm left adrenal nodule, likely reflecting a benign adrenal
adenoma.

## 2021-08-21 MED ORDER — ALPRAZOLAM 0.5 MG PO TABS
0.5000 mg | ORAL_TABLET | Freq: Two times a day (BID) | ORAL | Status: DC | PRN
  Administered 2021-08-21 – 2021-08-22 (×4): 0.5 mg via ORAL
  Filled 2021-08-21 (×4): qty 1

## 2021-08-21 MED ORDER — HYDROCORTISONE SOD SUC (PF) 100 MG IJ SOLR
100.0000 mg | Freq: Three times a day (TID) | INTRAMUSCULAR | Status: DC
  Administered 2021-08-21 – 2021-08-22 (×5): 100 mg via INTRAVENOUS
  Filled 2021-08-21 (×5): qty 2

## 2021-08-21 MED ORDER — DEXTROSE-NACL 10-0.45 % IV SOLN: INTRAVENOUS | Status: DC

## 2021-08-21 MED ORDER — IODINE STRONG (LUGOLS) 5 % PO SOLN: 0.4000 mL | Freq: Three times a day (TID) | ORAL | Status: DC

## 2021-08-21 MED ORDER — INSULIN DETEMIR 100 UNIT/ML ~~LOC~~ SOLN
5.0000 [IU] | Freq: Two times a day (BID) | SUBCUTANEOUS | Status: DC
  Filled 2021-08-21: qty 0.05

## 2021-08-21 MED ORDER — INSULIN ASPART 100 UNIT/ML IJ SOLN
0.0000 [IU] | Freq: Every day | INTRAMUSCULAR | Status: DC
  Administered 2021-08-21: 2 [IU] via SUBCUTANEOUS
  Filled 2021-08-21: qty 1

## 2021-08-21 MED ORDER — IODINE STRONG (LUGOLS) 5 % PO SOLN: 0.5000 mL | Freq: Three times a day (TID) | ORAL | Status: DC

## 2021-08-21 MED ORDER — POTASSIUM CHLORIDE CRYS ER 20 MEQ PO TBCR
40.0000 meq | EXTENDED_RELEASE_TABLET | Freq: Every day | ORAL | Status: AC
  Administered 2021-08-21 – 2021-08-23 (×3): 40 meq via ORAL
  Filled 2021-08-21 (×3): qty 2

## 2021-08-21 MED ORDER — PROPRANOLOL HCL 40 MG PO TABS
80.0000 mg | ORAL_TABLET | Freq: Four times a day (QID) | ORAL | Status: DC
  Administered 2021-08-21 – 2021-08-23 (×9): 80 mg via ORAL
  Filled 2021-08-21 (×6): qty 2
  Filled 2021-08-21: qty 4
  Filled 2021-08-21: qty 2
  Filled 2021-08-21: qty 4
  Filled 2021-08-21: qty 2

## 2021-08-21 MED ORDER — HEPARIN SODIUM (PORCINE) 5000 UNIT/ML IJ SOLN
5000.0000 [IU] | Freq: Three times a day (TID) | INTRAMUSCULAR | Status: DC
  Administered 2021-08-21 – 2021-08-23 (×3): 5000 [IU] via SUBCUTANEOUS
  Filled 2021-08-21 (×4): qty 1

## 2021-08-21 MED ORDER — PROPRANOLOL HCL 20 MG PO TABS
60.0000 mg | ORAL_TABLET | Freq: Four times a day (QID) | ORAL | Status: DC
  Administered 2021-08-21 (×2): 60 mg via ORAL
  Filled 2021-08-21 (×2): qty 3

## 2021-08-21 MED ORDER — IODINE STRONG (LUGOLS) 5 % PO SOLN
0.5000 mL | Freq: Three times a day (TID) | ORAL | Status: DC
  Administered 2021-08-21 – 2021-08-22 (×5): 0.5 mL via ORAL
  Filled 2021-08-21 (×8): qty 0.5

## 2021-08-21 MED ORDER — ACETAMINOPHEN 325 MG PO TABS
650.0000 mg | ORAL_TABLET | ORAL | Status: AC | PRN
  Administered 2021-08-21 – 2021-08-22 (×3): 650 mg via ORAL
  Filled 2021-08-21 (×3): qty 2

## 2021-08-21 MED ORDER — DEXTROSE IN LACTATED RINGERS 5 % IV SOLN: INTRAVENOUS | Status: DC

## 2021-08-21 MED ORDER — QUETIAPINE FUMARATE 300 MG PO TABS
300.0000 mg | ORAL_TABLET | Freq: Every day | ORAL | Status: DC
Start: 2021-08-21 — End: 2021-08-23
  Administered 2021-08-21 – 2021-08-22 (×2): 300 mg via ORAL
  Filled 2021-08-21 (×2): qty 1

## 2021-08-21 MED ORDER — ORAL CARE MOUTH RINSE
15.0000 mL | OROMUCOSAL | Status: DC | PRN
Start: 2021-08-21 — End: 2021-08-23

## 2021-08-21 MED ORDER — DEXAMETHASONE SODIUM PHOSPHATE 4 MG/ML IJ SOLN: 4.0000 mg | Freq: Three times a day (TID) | INTRAMUSCULAR | Status: DC

## 2021-08-21 MED ORDER — LOPERAMIDE HCL 2 MG PO CAPS
2.0000 mg | ORAL_CAPSULE | ORAL | Status: DC | PRN
  Administered 2021-08-21 – 2021-08-22 (×5): 2 mg via ORAL
  Filled 2021-08-21 (×4): qty 1

## 2021-08-21 MED ORDER — CHLORHEXIDINE GLUCONATE CLOTH 2 % EX PADS
6.0000 | MEDICATED_PAD | Freq: Every day | CUTANEOUS | Status: DC
  Administered 2021-08-21 – 2021-08-23 (×3): 6 via TOPICAL

## 2021-08-21 MED ORDER — INSULIN ASPART 100 UNIT/ML IJ SOLN
0.0000 [IU] | Freq: Three times a day (TID) | INTRAMUSCULAR | Status: DC
  Administered 2021-08-21: 1 [IU] via SUBCUTANEOUS
  Administered 2021-08-21: 4 [IU] via SUBCUTANEOUS
  Administered 2021-08-22: 5 [IU] via SUBCUTANEOUS
  Filled 2021-08-21 (×3): qty 1

## 2021-08-21 MED ORDER — DEXTROSE-NACL 10-0.45 % IV SOLN
INTRAVENOUS | Status: DC
  Filled 2021-08-21: qty 1000

## 2021-08-21 MED ORDER — DEXTROSE 50 % IV SOLN
1.0000 | INTRAVENOUS | Status: DC | PRN
  Administered 2021-08-21 – 2021-08-23 (×2): 50 mL via INTRAVENOUS
  Filled 2021-08-21 (×3): qty 50

## 2021-08-21 MED ORDER — FUROSEMIDE 10 MG/ML IJ SOLN
40.0000 mg | Freq: Every day | INTRAMUSCULAR | Status: AC
  Administered 2021-08-21 – 2021-08-23 (×3): 40 mg via INTRAVENOUS
  Filled 2021-08-21 (×3): qty 4

## 2021-08-21 MED ORDER — ONDANSETRON HCL 4 MG/2ML IJ SOLN
4.0000 mg | Freq: Once | INTRAMUSCULAR | Status: AC
  Administered 2021-08-21: 4 mg via INTRAVENOUS
  Filled 2021-08-21: qty 2

## 2021-08-21 MED ORDER — INSULIN DETEMIR 100 UNIT/ML ~~LOC~~ SOLN
30.0000 [IU] | Freq: Two times a day (BID) | SUBCUTANEOUS | Status: DC
  Administered 2021-08-21 – 2021-08-22 (×4): 30 [IU] via SUBCUTANEOUS
  Filled 2021-08-21 (×6): qty 0.3

## 2021-08-21 NOTE — Progress Notes (Signed)
Pt arrived on floor via stretcher. Pt is nauseated. Pt has diarrhea. Pt is febrile and tachy. Pt noted on ra. Pt placed on all monitors. MD aware of arrival.

## 2021-08-21 NOTE — ED Notes (Signed)
Report given to [facility], RN in CCU

## 2021-08-21 NOTE — Progress Notes (Signed)
eLink Physician-Brief Progress Note Patient Name: Nancy [name] DOB: Sep 20, 2000 MRN: [id number]   Date of Service  08/21/2021  HPI/Events of Note  Patient asking for a PRN for headache, she also is complaining of some nausea, Blood Sugar  66 mg / dl on current rate of D 10 %.  eICU Interventions  PRN Tylenol ordered, Zofran ordered, D 10 % gtt rate increased to 75 ml / hour.     Intervention Category Intermediate Interventions: Pain - evaluation and management;Other: Minor Interventions: Routine modifications to care plan (e.g. PRN medications for pain, fever)  Naveed Humphres U Chloeann Alfred 08/21/2021, 3:22 AM

## 2021-08-21 NOTE — Progress Notes (Signed)
Anticoagulation monitoring(Lovenox):  21 yo female ordered Lovenox 40 mg Q24h    There were no vitals filed for this visit. BMI 30.6    Lab Results  Component Value Date   CREATININE <0.30 (L) 08/20/2021   CREATININE 0.31 (L) 08/09/2021   CREATININE <0.30 (L) 08/06/2021   CrCl cannot be calculated (This lab value cannot be used to calculate CrCl because it is not a number: <0.30). Hemoglobin & Hematocrit     Component Value Date/Time   HGB 8.5 (L) 08/20/2021 1804   HGB 12.8 10/05/2018 1133   HCT 27.8 (L) 08/20/2021 1804   HCT 40.8 10/05/2018 1133     Unable to calculate CrCl due to low SrCr.  Will change from lovenox 40 mg SQ Q24H to Heparin 5000 units SQ Q8H.

## 2021-08-21 NOTE — Progress Notes (Signed)
Report received from Sarah RN.

## 2021-08-21 NOTE — H&P (Addendum)
This is a progress note    NAME:  Nancy [name], MRN:  [id number], DOB:  May 28, 2000, LOS: 1 ADMISSION DATE:  08/20/2021, CONSULTATION DATE:  08/20/21 REFERRING MD:  Augusto Gamble, MD CHIEF COMPLAINT:  Hypoglycemia  History of Present Illness:  Pinki Rottman is a 21 year old woman with DMI, seizure disorder and congestive heart failure who presented to the ER after being found unresponsive at home. She reports low blood sugars over recent days despite not using her insulin. EMS was called after she was found by family. Blood sugar was 35 on arrival and she was started on D10 infusion. She reports abdominal pain, nausea, vomiting and diarrhea. She reports racing heart and diaphoresis. She also has a tremor of her hands.  In the ER her blood glucose continued to require D50 pushes. She was tachycardic into the 140s-150s. Labs showed TSH <0.010 and Free T4 >5.5.   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  Pertinent  Medical History   Past Medical History:  Diagnosis Date   Acanthosis nigricans    Anxiety    CHF (congestive heart failure) (HCC)    Chronic lower back pain    Depression    DKA, type 1 (HCC) 09/13/2018   Dyspepsia    Obesity    Ovarian cyst    pt is not aware of this hx (11/24/2017)   Precocious adrenarche (HCC)    Premature baby    Seizures (HCC)    Type II diabetes mellitus (HCC)    insulin dependant   Significant Hospital Events: Including procedures, antibiotic start and stop dates in addition to other pertinent events   8/16 admitted to ICU for thyroid storm  Interim History / Subjective:   She was given 10mg  propranolol PO in the ER along with 400mg  PTU and 300mg  IV hydrocortisone.   Objective   Blood pressure (!) 153/104, pulse (!) 113, temperature 100.2 F (37.9 C), temperature source Axillary, resp. rate 18, height 5\' 2"  (1.575 m), weight 76.4 kg, last menstrual period 07/02/2021, SpO2 100 %.        Intake/Output Summary (Last 24 hours) at 08/21/2021  0820 Last data filed at 08/21/2021 0340 Gross per 24 hour  Intake 668.33 ml  Output --  Net 668.33 ml   Filed Weights   08/21/21 0100  Weight: 76.4 kg    Examination: General: young woman, diaphoretic, moderate distress HENT: [place]/AT, moist mucous membranes, sclera anicteric Lungs: clear to auscultation, no wheezing Cardiovascular: tachcyardic, s1s2, no murmurs Abdomen: soft, non-tender, non-distended Extremities: warm, 2+ pretibial edema  Neuro: alert, oriented, moving all extremities GU: n/a  CXR: cardiomegaly, bilateral pulmonary edema  Resolved Hospital Problem list   Acute Metabolic Encephalopathy - improved  Assessment & Plan:   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  Maybe this contributed to her over 20 ER visits in past year. Chronic Anemia Hx seizures vs. pseudoseizures Type 1 DM Canniboid hyperemesis syndrome Cyclic vomiting disorder Mood disorder NOS  Plan: - Continue steroids, propranolol, PTU as ordered - f/u echo - Start standing lasix for next couple days - Wean off D10 drip - Follow uptodate guidelines for workup as pictured below - Follow the following for transition to OP: "Iodine therapy can be discontinued (unless a thyroidectomy is planned in the next 10 to 14 days). ?Beta blockers can be withdrawn, but only after thyroid function tests have returned to normal. ?Glucocorticoids are tapered and discontinued. The pace  of the glucocorticoid taper depends upon the clinical course of the patient; a slower taper is necessary in patients who had a prolonged intensive care unit (ICU) stay with longer duration of glucocorticoid treatment. Some patients may need testing of adrenal function prior to discontinuing glucocorticoids. (See "Diagnosis of adrenal insufficiency in adults".) ?Propylthiouracil (PTU), if given, should be switched to methimazole once the T3  is declining and hospital discharge is anticipated. Methimazole is preferred because of its better safety profile and better compliance rates. When switching to methimazole, the initial dose is generally high, 30 to 40 mg daily in divided doses, depending on the patient's clinical status. The dose is then titrated down to maintain euthyroidism. It is important to provide patient education about the risk of abruptly discontinuing treatment. Monitoring, dosing, and duration of thionamide therapy are reviewed in detail separately. (See "Thionamides in the treatment of Graves' disease", section on 'Monitoring'.)" UTD 08/21/21  If remains off D10 can transition out of ICU    Ref: UTD (08/21/21)  Myrla Halsted MD PCCM

## 2021-08-21 NOTE — Progress Notes (Addendum)
eLink Physician-Brief Progress Note Patient Name: Nancy [name] DOB: 27-Oct-2000 MRN: [id number]   Date of Service  08/21/2021  HPI/Events of Note  Type 1 diabetic admitted with recurrent hypoglycemia requiring D 50 % glucose pushes, blood sugar remains on the low side.  eICU Interventions  New Patient Evaluation.  Dr. Francine Graven has already addressed the hypoglycemia.        [name] 08/21/2021, 2:34 AM

## 2021-08-22 ENCOUNTER — Inpatient Hospital Stay (HOSPITAL_COMMUNITY)
Admit: 2021-08-22 | Discharge: 2021-08-22 | Disposition: A | Discharge status: Home / Self Care | Payer: Medicaid Other | Attending: Obstetrics and Gynecology | Admitting: Obstetrics and Gynecology

## 2021-08-22 DIAGNOSIS — I361 Nonrheumatic tricuspid (valve) insufficiency: Secondary | ICD-10-CM | POA: Diagnosis not present

## 2021-08-22 DIAGNOSIS — E0581 Other thyrotoxicosis with thyrotoxic crisis or storm: Secondary | ICD-10-CM | POA: Diagnosis not present

## 2021-08-22 LAB — GASTROINTESTINAL PANEL BY PCR, STOOL (REPLACES STOOL CULTURE)

## 2021-08-22 LAB — URINALYSIS, COMPLETE (UACMP) WITH MICROSCOPIC
Bacteria, UA: NONE SEEN
Bilirubin Urine: NEGATIVE
Glucose, UA: >=500 mg/dL — AB
Hgb urine dipstick: NEGATIVE
Ketones, ur: NEGATIVE mg/dL
Leukocytes,Ua: NEGATIVE
Nitrite: NEGATIVE
Protein, ur: NEGATIVE mg/dL
Specific Gravity, Urine: 1.013 (ref 1.005–1.030)
pH: 6 (ref 5.0–8.0)

## 2021-08-22 LAB — BASIC METABOLIC PANEL
Anion gap: 6 (ref 5–15)
BUN: 19 mg/dL (ref 6–20)
CO2: 25 mmol/L (ref 22–32)
Calcium: 8.8 mg/dL — ABNORMAL LOW (ref 8.9–10.3)
Chloride: 107 mmol/L (ref 98–111)
Creatinine, Ser: 0.35 mg/dL — ABNORMAL LOW (ref 0.44–1.00)
GFR, Estimated: >60 mL/min (ref >60)
Glucose, Bld: 302 mg/dL — ABNORMAL HIGH (ref 70–99)
Potassium: 3.7 mmol/L (ref 3.5–5.1)
Sodium: 138 mmol/L (ref 135–145)

## 2021-08-22 LAB — T4, FREE: Free T4: >5.5 ng/dL — ABNORMAL HIGH (ref 0.61–1.12)

## 2021-08-22 LAB — GLUCOSE, CAPILLARY
Glucose-Capillary: 338 mg/dL — ABNORMAL HIGH (ref 70–99)
Glucose-Capillary: 258 mg/dL — ABNORMAL HIGH (ref 70–99)
Glucose-Capillary: 232 mg/dL — ABNORMAL HIGH (ref 70–99)
Glucose-Capillary: 357 mg/dL — ABNORMAL HIGH (ref 70–99)

## 2021-08-22 LAB — C DIFFICILE QUICK SCREEN W PCR REFLEX
C Diff antigen: NEGATIVE
C Diff interpretation: NOT DETECTED
C Diff toxin: NEGATIVE

## 2021-08-22 LAB — THYROID ANTIBODIES
Thyroglobulin Antibody: 57.4 IU/mL — ABNORMAL HIGH (ref 0.0–0.9)
Thyroperoxidase Ab SerPl-aCnc: 467 IU/mL — ABNORMAL HIGH (ref 0–34)

## 2021-08-22 LAB — PHOSPHORUS: Phosphorus: 3.4 mg/dL (ref 2.5–4.6)

## 2021-08-22 LAB — MAGNESIUM: Magnesium: 2.1 mg/dL (ref 1.7–2.4)

## 2021-08-22 LAB — TSH: TSH: <0.01 u[IU]/mL — ABNORMAL LOW (ref 0.350–4.500)

## 2021-08-22 LAB — T3, FREE: T3, Free: 30 pg/mL — ABNORMAL HIGH (ref 2.0–4.4)

## 2021-08-22 MED ORDER — BISMUTH SUBSALICYLATE 262 MG/15ML PO SUSP
30.0000 mL | ORAL | Status: DC | PRN
  Administered 2021-08-22: 30 mL via ORAL
  Filled 2021-08-22: qty 118

## 2021-08-22 MED ORDER — INSULIN ASPART 100 UNIT/ML IJ SOLN
0.0000 [IU] | Freq: Every day | INTRAMUSCULAR | Status: DC
  Administered 2021-08-22: 5 [IU] via SUBCUTANEOUS
  Filled 2021-08-22: qty 1

## 2021-08-22 MED ORDER — PROPYLTHIOURACIL 50 MG PO TABS
200.0000 mg | ORAL_TABLET | ORAL | Status: AC
  Administered 2021-08-22 (×2): 200 mg via ORAL
  Filled 2021-08-22 (×2): qty 4

## 2021-08-22 MED ORDER — ONDANSETRON 4 MG PO TBDP
4.0000 mg | ORAL_TABLET | Freq: Four times a day (QID) | ORAL | Status: DC | PRN
  Filled 2021-08-22: qty 1

## 2021-08-22 MED ORDER — INSULIN ASPART 100 UNIT/ML IJ SOLN
5.0000 [IU] | Freq: Three times a day (TID) | INTRAMUSCULAR | Status: DC
  Administered 2021-08-23: 5 [IU] via SUBCUTANEOUS
  Filled 2021-08-22: qty 1

## 2021-08-22 MED ORDER — IODINE STRONG (LUGOLS) 5 % PO SOLN
0.5000 mL | Freq: Three times a day (TID) | ORAL | Status: DC
  Administered 2021-08-22 (×2): 0.5 mL via ORAL
  Filled 2021-08-22 (×2): qty 0.5

## 2021-08-22 MED ORDER — METOCLOPRAMIDE HCL 10 MG PO TABS
10.0000 mg | ORAL_TABLET | Freq: Three times a day (TID) | ORAL | Status: DC
  Administered 2021-08-22 – 2021-08-23 (×3): 10 mg via ORAL
  Filled 2021-08-22 (×3): qty 1

## 2021-08-22 MED ORDER — NIFEDIPINE ER OSMOTIC RELEASE 30 MG PO TB24: 30.0000 mg | ORAL_TABLET | Freq: Two times a day (BID) | ORAL | Status: DC

## 2021-08-22 MED ORDER — INSULIN ASPART 100 UNIT/ML IJ SOLN
0.0000 [IU] | Freq: Three times a day (TID) | INTRAMUSCULAR | Status: DC
  Administered 2021-08-22: 5 [IU] via SUBCUTANEOUS
  Administered 2021-08-22: 8 [IU] via SUBCUTANEOUS
  Administered 2021-08-23: 3 [IU] via SUBCUTANEOUS
  Filled 2021-08-22 (×3): qty 1

## 2021-08-22 MED ORDER — METHIMAZOLE 10 MG PO TABS
20.0000 mg | ORAL_TABLET | Freq: Every day | ORAL | Status: DC
  Administered 2021-08-23: 20 mg via ORAL
  Filled 2021-08-22: qty 2

## 2021-08-22 MED ORDER — LOPERAMIDE HCL 2 MG PO CAPS
4.0000 mg | ORAL_CAPSULE | ORAL | Status: DC | PRN
  Administered 2021-08-22: 4 mg via ORAL
  Filled 2021-08-22: qty 2

## 2021-08-22 NOTE — Progress Notes (Signed)
PHARMACY CONSULT NOTE - FOLLOW UP  Pharmacy Consult for Electrolyte Monitoring and Replacement   Recent Labs: Potassium (mmol/L)  Date Value  08/22/2021 3.7   Magnesium (mg/dL)  Date Value  16/10/9602 2.1   Calcium (mg/dL)  Date Value  54/09/8117 8.8 (L)   Albumin (g/dL)  Date Value  14/78/2956 3.1 (L)  10/05/2018 4.7   Phosphorus (mg/dL)  Date Value  21/30/8657 3.4   Sodium (mmol/L)  Date Value  08/22/2021 138  10/05/2018 139    Assessment: Patient currently on treatment for thyroid storm. Pharmacy CCM for electrolytes and renal dose adjustment initiated yesterday while in intensive care.   Goal of Therapy:  K>/= 4, Mg >/=2, all other electrolytes within normal limits  Plan:  Mg 2.1 - no replacement needed K 3.7, replaced by MD Phos 3.4, no replacement needed Ca (corrected for low albumin) 9.5, no replacement needed Patient now out oc critical care. Will d/c CCM order and follow up as needed.  Tesneem Dufrane Rodriguez-Guzman PharmD, BCPS 08/22/2021 9:49 AM

## 2021-08-22 NOTE — TOC Initial Note (Signed)
Transition of Care [facility]) - Initial/Assessment Note    Patient Details  Name: Nancy [name] MRN: [id number] Date of Birth: 12-May-2000  [facility] of [facility]) CM/SW Contact:    Candie Chroman, LCSW Phone Number: 08/22/2021, 10:33 AM  Clinical Narrative:   Readmission prevention screen complete. CSW met with patient. No supports at bedside. CSW introduced role and explained that discharge planning would be discussed. Patient lives with her mother. PCP is Nolene Ebbs, MD. Her mother drives her to appointments. [facility] [place]. No issues obtaining medications but reports issue with [facility] sensor which she has addressed with the diabetes coordinator. No home health or DME use prior to admission. No further concerns. CSW encouraged patient to contact CSW as needed. CSW will continue to follow patient for support and facilitate return home once stable. Her mother will transport her home at discharge.           Expected Discharge Plan: Home/Self Care Barriers to Discharge: Continued Medical Work up   Patient Goals and CMS Choice        Expected Discharge Plan and Services Expected Discharge Plan: Home/Self Care     Post Acute Care Choice: NA Living arrangements for the past 2 months: Apartment                                      Prior Living Arrangements/Services Living arrangements for the past 2 months: Apartment Lives with:: Parents Patient language and need for interpreter reviewed:: Yes Do you feel safe going back to the place where you live?: Yes      Need for Family Participation in Patient Care: Yes (Comment) Care giver support system in place?: Yes (comment)   Criminal Activity/Legal Involvement Pertinent to Current Situation/Hospitalization: No - Comment as needed  Activities of Daily Living Home Assistive Devices/Equipment: None ADL Screening (condition at time of admission) Patient's cognitive ability adequate to safely complete daily  activities?: Yes Is the patient deaf or have difficulty hearing?: No Does the patient have difficulty seeing, even when wearing glasses/contacts?: No Does the patient have difficulty concentrating, remembering, or making decisions?: No Patient able to express need for assistance with ADLs?: Yes Does the patient have difficulty dressing or bathing?: No Independently performs ADLs?: Yes (appropriate for developmental age) Does the patient have difficulty walking or climbing stairs?: No Weakness of Legs: Both Weakness of Arms/Hands: None  Permission Sought/Granted                  Emotional Assessment Appearance:: Appears stated age Attitude/Demeanor/Rapport: Engaged, Gracious Affect (typically observed): Accepting, Appropriate, Calm, Pleasant Orientation: : Oriented to Self, Oriented to Place, Oriented to  Time, Oriented to Situation Alcohol / Substance Use: Not Applicable Psych Involvement: No (comment)  Admission diagnosis:  Hypoglycemia [E16.2] 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, unspecified thyrotoxicosis type [E05.91] Patient Active Problem List   Diagnosis Date Noted   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   Microcytic anemia 02/12/2021   Uncontrolled type 1 diabetes mellitus with hyperglycemia, with long-term current use of insulin ([place]) 02/12/2021   Left inguinal abscess 02/12/2021   Asymptomatic bacteriuria 02/12/2021   Nicotine dependence 02/12/2021   Abdominal pain 02/22/2020   Chronic constipation 02/22/2020   Nausea & vomiting 02/22/2020   SIRS (systemic inflammatory response syndrome) ([place]) 01/14/2020   Diabetic gastroparesis ([place]) 11/25/2019   Polysubstance abuse ([place]) 11/25/2019   Gastritis 11/25/2019  DKA, type 1 ([place]) 90/38/3338   Cyclical vomiting with nausea 10/01/2019   Intractable nausea and vomiting 09/30/2019   Cellulitis    DKA (diabetic ketoacidoses) 01/27/2019   Leukocytosis 12/19/2018   Dehydration     Pseudoseizures ([place])    Type 1 diabetes mellitus with ketoacidosis without coma ([place]) 11/28/2018   Hyperglycemia 11/27/2018   Severe recurrent major depression without psychotic features ([place]) 11/08/2018   MDD (major depressive disorder), recurrent episode, severe ([place]) 11/06/2018   Nonspecific abnormal electrocardiogram (ECG) (EKG)    Chest pain of uncertain etiology    Hypertensive urgency 10/08/2018   Conversion disorder with attacks or seizures, acute episode, with psychological stressor 09/16/2018   MDD (major depressive disorder), recurrent severe, without psychosis ([place])    Hypokalemia 08/04/2018   AKI (acute kidney injury) ([place]) 08/04/2018   Seizure-like activity ([place]) 08/03/2018   Depression 07/25/2018   Syncope 01/30/2018   Orthostatic hypotension 01/24/2018   Tachycardia 12/28/2017   Chronic abdominal pain 12/24/2017   Chest pain 12/19/2017   Nausea and vomiting 08/21/2017   Generalized abdominal pain 08/21/2017   Non compliance with medical treatment 01/27/2012   Adjustment disorder 09/16/2011   Acanthosis nigricans    Goiter    Obesity 06/14/2010   Hypertension 06/14/2010   PCP:  Nolene Ebbs, MD Pharmacy[facility]   [id number] [place] [address] [id number]: [phone]: [phone]    Social Determinants of Health (SDOH) Interventions    Readmission Risk Interventions    08/22/2021   10:32 AM 11/26/2019   11:12 AM 08/08/2019    3:57 PM  Readmission Risk Prevention Plan  Transportation Screening Complete Complete Complete  Medication Review (RN Care Manager) Complete  Complete  PCP or Specialist appointment within 3-5 days of discharge Complete Not Complete Complete  PCP/Specialist Appt Not Complete comments  Unable to reach Dr ACherre Robinsoffice on Friday.  LM.  Discussed with pt that she should call if goes home on weekend.   [facility] Consult  Complete Complete  SW Recovery Care/Counseling Consult  Complete Complete Complete  Palliative Care Screening Not Applicable Not Applicable Not Applicable  Skilled Nursing Facility Not Applicable Not Applicable Not Applicable

## 2021-08-22 NOTE — Progress Notes (Signed)
*  PRELIMINARY RESULTS* Echocardiogram 2D Echocardiogram has been performed.  Joanette Gula Iman Orourke 08/22/2021, 1:49 PM

## 2021-08-22 NOTE — Inpatient Diabetes Management (Signed)
Inpatient Diabetes Program Recommendations  AACE/ADA: New Consensus Statement on Inpatient Glycemic Control (2015)  Target Ranges:  Prepandial:   less than 140 mg/dL      Peak postprandial:   less than 180 mg/dL (1-2 hours)      Critically ill patients:  140 - 180 mg/dL   Lab Results  Component Value Date   GLUCAP 338 (H) 08/22/2021   HGBA1C 6.4 (H) 08/21/2021   Diabetes history: DM1 (requires basal, meal coverage, and correction Outpatient Diabetes medications: Lantus 60 q hs (has been taking q 3 days due to hypoglycemia), Novolog 15 units tid meal coverage Current orders for Inpatient glycemic control: Levermir 30 units bid, Novolog correction 0-15 units tid, 0-5 units hs, Solucortef 100 mg q 8 hrs.  Inpatient Diabetes Program Recommendations:   Please consider: -Add Novolog 5 units tid meal coverage if eats 50% meals  Spoke with patient regarding diabetes management @ home. Patient states she does not have an endocrinologist but would like to have a referral. Patient shared she has notified her PCP office ? # calls to discuss hypoglycemic events without change in insulin dosing. Patient started only taking her Lantus q 3 days to decrease amount of hypoglycemic events. Reviewed with patient need to discuss with physicians change in daily dose to prevent hypoglycemia.  Patient has not been able to get prescription covered for [facility] sensor but discussed option of getting order for sensor to wear home @ discharge to assist patient in frequent assessment of glucose levels.  Please consider for discharge: Josephine Igo sensor #[id number]  Thank you, Billy Fischer. Sydney Hasten, RN, MSN, CDE  Diabetes Coordinator Inpatient Glycemic Control Team Team Pager [phone] (8am-5pm) 08/22/2021 9:50 AM

## 2021-08-22 NOTE — Progress Notes (Signed)
Notified Mannam, MD of tachypnea, hr dropping into 40's and diarrhea.

## 2021-08-22 NOTE — Progress Notes (Signed)
MEDICATION RELATED CONSULT NOTE  Pharmacy Consult for hx of narcotic use  Allergies  Allergen Reactions   Oatmeal Anaphylaxis   Tomato Anaphylaxis   Ibuprofen Other (See Comments)    GI MD said to not take this anymore   Labs: Recent Labs    08/20/21 1804 08/21/21 0354 08/22/21 0613  WBC 6.3 7.8  --   HGB 8.5* 8.1*  --   HCT 27.8* 25.6*  --   PLT 235 233  --   CREATININE <0.30* <0.30* 0.35*  MG 1.7 2.1 2.1  PHOS  --  4.5 3.4  ALBUMIN 3.1*  --   --   PROT 6.1*  --   --   AST 36  --   --   ALT 22  --   --   ALKPHOS 100  --   --   BILITOT 0.8  --   --    Estimated Creatinine Clearance: 107.7 mL/min (A) (by C-G formula based on SCr of 0.35 mg/dL (L)).   Microbiology: Recent Results (from the past 720 hour(s))  C Difficile Quick Screen w PCR reflex     Status: None   Collection Time: 08/22/21 11:00 AM   Specimen: STOOL  Result Value Ref Range Status   C Diff antigen NEGATIVE NEGATIVE Final   C Diff toxin NEGATIVE NEGATIVE Final   C Diff interpretation No C. difficile detected.  Final    Comment: Performed at [facility], [address], [place], [place] [address]    Patient requesting opoid this admission. Stated she was taking morphine and oxycodone PTA.  Medication reconciliation completed. Multiple pharmacies contacted by phone and PMP Aware [place] verified.  Last RX for pain management was Toradol 50mg  #10 tabs on Feb/2022. Information provided to MD.  No additional intervention from pharmacist needed at this time  Sten Dematteo Rodriguez-Guzman PharmD, BCPS 08/22/2021 12:51 PM

## 2021-08-22 NOTE — Progress Notes (Signed)
PROGRESS NOTE    Nancy [name]  [id number] DOB: 05-30-2000 DOA: 08/20/2021 PCP: Nolene Ebbs, MD     Brief Narrative:   Hx t1dm, seizure disorder, chf, presenting after being found unresponsive at home, blood sugar 35 on arrival. Complaining of abd pain that is chronic, also diarrhea. Tsh found to be very low and t4 very elevated, admitted to ICU with concern for thyroid storm and treated for such.   Assessment & Plan:   Principal Problem:   Thyroid crisis or storm Active Problems:   Obesity   Hypertension   Polysubstance abuse ([place])   # Hyperthyroid # Concern for thyroid storm Abdominal pain, diarrhea, tachycardia. No hyperpyrexia though. Was altered though also hypoglycemic at the time. Treating as though this is thyroid storm. Thyroid u/s with heterogeneous thyroid. Thyroid antibodies elevated. Has hx of chf, appears to be relatively compensated. TFTs this morning remain markedly altered - continue ptu - continue propranolol - continue hydrocortisone - continue logol's iodine - will attempt to discuss w/ endo later today - check urinalysis and blood cultures to eval for occult infection  # CHF? Patient endorses this, most recent echo in our system from 2020 is normal. CXR does show signs fluid overload and cardiac dysfunction common in thyroid storm - tele - TTE - continue lasix - strict I/Os  # T1DM Presented hypoglycemic now resolved, glucose now elevated in setting of steroids - dm educator consulted - continue levemir 30 bid - increase sliding scale  # HTN BP elevated in setting of hyperthyrodism - propranolol as above, will add additional agent if doesn't improve  # Diarrhea Likely consequence of hyperthyroidism - increase loperamide - f/u c diff and gi pathogen panel  # Chronic pain - med rec per pharm - add procardia   DVT prophylaxis: heparin Code Status: full Family Communication: none @ bedside  Level of care: Progressive Status  is: Inpatient Remains inpatient appropriate because: severity of illness    Consultants:  none  Procedures: none  Antimicrobials:  none    Subjective: This morning chronic abd pain, tolerating diet, diarrhea persists  Objective: Vitals:   08/21/21 2335 08/22/21 0416 08/22/21 0448 08/22/21 0800  BP: (!) 121/54 (!) 144/74  (!) 165/72  Pulse: (!) 107 (!) 108    Resp: 18 (!) 23  10  Temp: 98.3 F (36.8 C) 97.8 F (36.6 C)  97.8 F (36.6 C)  TempSrc: Oral   Oral  SpO2: 100% 100%    Weight:   80.8 kg   Height:        Intake/Output Summary (Last 24 hours) at 08/22/2021 0918 Last data filed at 08/22/2021 0500 Gross per 24 hour  Intake 720 ml  Output --  Net 720 ml   Filed Weights   08/21/21 0100 08/22/21 0448  Weight: 76.4 kg 80.8 kg    Examination:  General exam: Appears calm and comfortable  Respiratory system: Clear to auscultation. Respiratory effort normal. Cardiovascular system: S1 & S2 heard, tachycardic. No JVD, murmurs, rubs, gallops or clicks.  Gastrointestinal system: Abdomen is nondistended, soft and nontender. No organomegaly or masses felt. Normal bowel sounds heard. Central nervous system: Alert and oriented. No focal neurological deficits. Extremities: Symmetric 5 x 5 power. Edema to knees Skin: No rashes, lesions or ulcers Psychiatry: Judgement and insight appear normal. Mood & affect appropriate.     Data Reviewed: I have personally reviewed following labs and imaging studies  CBC: Recent Labs  Lab 08/20/21 1804 08/21/21 0354  WBC 6.3  7.8  NEUTROABS 4.2  --   HGB 8.5* 8.1*  HCT 27.8* 25.6*  MCV 77.4* 74.4*  PLT 235 233   Basic Metabolic Panel: Recent Labs  Lab 08/20/21 1804 08/21/21 0354 08/22/21 0613  NA 144 138 138  K 3.5 3.9 3.7  CL 113* 108 107  CO2 26 24 25   GLUCOSE 58* 107* 302*  BUN 11 9 19   CREATININE <0.30* <0.30* 0.35*  CALCIUM 9.5 9.4 8.8*  MG 1.7 2.1 2.1  PHOS  --  4.5 3.4   GFR: Estimated Creatinine  Clearance: 107.7 mL/min (A) (by C-G formula based on SCr of 0.35 mg/dL (L)). Liver Function Tests: Recent Labs  Lab 08/20/21 1804  AST 36  ALT 22  ALKPHOS 100  BILITOT 0.8  PROT 6.1*  ALBUMIN 3.1*   No results for input(s): "LIPASE", "AMYLASE" in the last 168 hours. No results for input(s): "AMMONIA" in the last 168 hours. Coagulation Profile: No results for input(s): "INR", "PROTIME" in the last 168 hours. Cardiac Enzymes: No results for input(s): "CKTOTAL", "CKMB", "CKMBINDEX", "TROPONINI" in the last 168 hours. BNP (last 3 results) No results for input(s): "PROBNP" in the last 8760 hours. HbA1C: Recent Labs    08/21/21 0932  HGBA1C 6.4*   CBG: Recent Labs  Lab 08/21/21 0948 08/21/21 1139 08/21/21 1643 08/21/21 2337 08/22/21 0755  GLUCAP 327* 327* 200* 223* 338*   Lipid Profile: No results for input(s): "CHOL", "HDL", "LDLCALC", "TRIG", "CHOLHDL", "LDLDIRECT" in the last 72 hours. Thyroid Function Tests: Recent Labs    08/20/21 2254 08/22/21 0613  TSH  --  <0.010*  FREET4  --  >5.50*  T3FREE 30.0*  --    Anemia Panel: No results for input(s): "VITAMINB12", "FOLATE", "FERRITIN", "TIBC", "IRON", "RETICCTPCT" in the last 72 hours. Urine analysis:    Component Value Date/Time   COLORURINE YELLOW (A) 08/20/2021 1804   APPEARANCEUR CLOUDY (A) 08/20/2021 1804   LABSPEC 1.010 08/20/2021 1804   PHURINE 6.0 08/20/2021 1804   GLUCOSEU NEGATIVE 08/20/2021 1804   HGBUR NEGATIVE 08/20/2021 1804   BILIRUBINUR NEGATIVE 08/20/2021 1804   BILIRUBINUR negative 07/13/2018 1651   KETONESUR NEGATIVE 08/20/2021 1804   PROTEINUR NEGATIVE 08/20/2021 1804   UROBILINOGEN 0.2 07/13/2018 1651   UROBILINOGEN 1.0 07/09/2017 1324   NITRITE NEGATIVE 08/20/2021 1804   LEUKOCYTESUR NEGATIVE 08/20/2021 1804   Sepsis Labs: @LABRCNTIP (procalcitonin:4,lacticidven:4)  )No results found for this or any previous visit (from the past 240 hour(s)).       Radiology Studies: 08/22/2021  THYROID  Result Date: 08/21/2021 CLINICAL DATA:  Hyperthyroid. EXAM: THYROID ULTRASOUND TECHNIQUE: Ultrasound examination of the thyroid gland and adjacent soft tissues was performed. COMPARISON:  None Available. FINDINGS: Parenchymal Echotexture: Mildly heterogeneous, mild glandular hyperemia Isthmus: 1.6 cm Right lobe: 6.5 x 3.4 x 2.8 cm Left lobe: 6.6 x 3.4 x 2.9 cm ________________________________________________________ Estimated total number of nodules >/= 1 cm: 0 Number of spongiform nodules >/=  2 cm not described below (TR1): 0 Number of mixed cystic and solid nodules >/= 1.5 cm not described below (TR2): 0 _________________________________________________________ No discrete nodules are seen within the thyroid gland. No cervical lymphadenopathy. IMPRESSION: Moderately enlarged, mildly heterogeneous thyroid gland with mild diffuse hyperemia. These findings are nonspecific but could be seen with thyroiditis or autoimmune thyroid disorder. The above is in keeping with the ACR TI-RADS recommendations - J Am Coll Radiol 2017;14:587-595. , MD [facility] Radiology Electronically Signed   By: 08/23/2021 M.D.   On: 08/21/2021  11:30   DG Chest Port 1 View  Result Date: 08/21/2021 CLINICAL DATA:  Found unresponsive on the floor EXAM: PORTABLE CHEST 1 VIEW COMPARISON:  Radiographs 06/28/2021 FINDINGS: New hazy bilateral airspace and interstitial opacities suggestive of edema. Stable mild cardiomegaly. No pleural effusion or pneumothorax. No acute osseous abnormality. IMPRESSION: Mild pulmonary edema.  Cardiomegaly. Electronically Signed   By: Minerva Fester M.D.   On: 08/21/2021 00:18        Scheduled Meds:  Chlorhexidine Gluconate Cloth  6 each Topical Daily   furosemide  40 mg Intravenous Daily   heparin injection (subcutaneous)  5,000 Units Subcutaneous Q8H   hydrocortisone sod succinate (SOLU-CORTEF) inj  100 mg Intravenous Q8H    insulin aspart  0-15 Units Subcutaneous TID WC   insulin aspart  0-5 Units Subcutaneous QHS   insulin detemir  30 Units Subcutaneous BID   Iodine Strong (Lugols)  0.5 mL Oral TID   potassium chloride  40 mEq Oral Daily   propranolol  80 mg Oral QID   propylthiouracil  200 mg Oral Q4H   QUEtiapine  300 mg Oral QHS   Continuous Infusions:   LOS: 2 days     Silvano Bilis, MD [facility]   If 7PM-7AM, please contact night-coverage [email or web address] Password [facility] 08/22/2021, 9:18 AM

## 2021-08-23 ENCOUNTER — Encounter: Payer: Self-pay | Admitting: Pulmonary Disease

## 2021-08-23 DIAGNOSIS — E0581 Other thyrotoxicosis with thyrotoxic crisis or storm: Secondary | ICD-10-CM | POA: Diagnosis not present

## 2021-08-23 LAB — MAGNESIUM: Magnesium: 1.7 mg/dL (ref 1.7–2.4)

## 2021-08-23 LAB — BASIC METABOLIC PANEL
Anion gap: 4 — ABNORMAL LOW (ref 5–15)
Anion gap: 10 (ref 5–15)
BUN: 19 mg/dL (ref 6–20)
BUN: 18 mg/dL (ref 6–20)
CO2: 26 mmol/L (ref 22–32)
CO2: 23 mmol/L (ref 22–32)
Calcium: 9.2 mg/dL (ref 8.9–10.3)
Calcium: 8.9 mg/dL (ref 8.9–10.3)
Chloride: 113 mmol/L — ABNORMAL HIGH (ref 98–111)
Chloride: 108 mmol/L (ref 98–111)
Creatinine, Ser: <0.3 mg/dL — ABNORMAL LOW (ref 0.44–1.00)
Creatinine, Ser: 0.32 mg/dL — ABNORMAL LOW (ref 0.44–1.00)
GFR, Estimated: >60 mL/min (ref >60)
Glucose, Bld: 76 mg/dL (ref 70–99)
Glucose, Bld: 179 mg/dL — ABNORMAL HIGH (ref 70–99)
Potassium: 2.8 mmol/L — ABNORMAL LOW (ref 3.5–5.1)
Potassium: 3.5 mmol/L (ref 3.5–5.1)
Sodium: 143 mmol/L (ref 135–145)
Sodium: 141 mmol/L (ref 135–145)

## 2021-08-23 LAB — ECHOCARDIOGRAM COMPLETE
AR max vel: 2.96 cm2
AV Area VTI: 3.17 cm2
AV Area mean vel: 3.38 cm2
AV Mean grad: 7 mmHg
AV Peak grad: 14.7 mmHg
Ao pk vel: 1.92 m/s
Area-P 1/2: 4.71 cm2
Height: 62 in
S' Lateral: 2.91 cm
Weight: 2850.11 oz

## 2021-08-23 LAB — GLUCOSE, CAPILLARY
Glucose-Capillary: 46 mg/dL — ABNORMAL LOW (ref 70–99)
Glucose-Capillary: 46 mg/dL — ABNORMAL LOW (ref 70–99)
Glucose-Capillary: 150 mg/dL — ABNORMAL HIGH (ref 70–99)
Glucose-Capillary: 192 mg/dL — ABNORMAL HIGH (ref 70–99)
Glucose-Capillary: 94 mg/dL (ref 70–99)

## 2021-08-23 LAB — TSH: TSH: <0.01 u[IU]/mL — ABNORMAL LOW (ref 0.350–4.500)

## 2021-08-23 LAB — T3: T3, Total: 270 ng/dL — ABNORMAL HIGH (ref 71–180)

## 2021-08-23 LAB — T4, FREE: Free T4: 4.97 ng/dL — ABNORMAL HIGH (ref 0.61–1.12)

## 2021-08-23 MED ORDER — POTASSIUM CHLORIDE CRYS ER 20 MEQ PO TBCR
80.0000 meq | EXTENDED_RELEASE_TABLET | Freq: Once | ORAL | Status: AC
  Administered 2021-08-23: 80 meq via ORAL
  Filled 2021-08-23: qty 4

## 2021-08-23 MED ORDER — PROPRANOLOL HCL 80 MG PO TABS: 80.0000 mg | ORAL_TABLET | Freq: Three times a day (TID) | ORAL | 1 refills | Status: DC

## 2021-08-23 MED ORDER — LOSARTAN POTASSIUM 25 MG PO TABS: 25.0000 mg | ORAL_TABLET | Freq: Every day | ORAL | 1 refills | Status: DC

## 2021-08-23 MED ORDER — LOPERAMIDE HCL 2 MG PO CAPS: 4.0000 mg | ORAL_CAPSULE | ORAL | 0 refills | Status: DC | PRN

## 2021-08-23 MED ORDER — INSULIN GLARGINE 100 UNITS/ML SOLOSTAR PEN: 20.0000 [IU] | PEN_INJECTOR | Freq: Two times a day (BID) | SUBCUTANEOUS | 0 refills | Status: DC

## 2021-08-23 MED ORDER — FREESTYLE LIBRE 2 SENSOR MISC: 1 refills | Status: DC

## 2021-08-23 MED ORDER — METHIMAZOLE 10 MG PO TABS: 20.0000 mg | ORAL_TABLET | Freq: Every day | ORAL | 1 refills | Status: DC

## 2021-08-23 MED ORDER — BAQSIMI TWO PACK 3 MG/DOSE NA POWD: 1.0000 | NASAL | 1 refills | Status: DC | PRN

## 2021-08-23 MED ORDER — NOVOLOG FLEXPEN 100 UNIT/ML ~~LOC~~ SOPN: 10.0000 [IU] | PEN_INJECTOR | Freq: Two times a day (BID) | SUBCUTANEOUS | 11 refills | Status: DC

## 2021-08-23 NOTE — Discharge Instructions (Signed)
Please call [facility] Endocr[facility]ology regarding getting established with local Endocrinology. [address] [place], [place] [address] [phone]

## 2021-08-23 NOTE — Inpatient Diabetes Management (Addendum)
Inpatient Diabetes Program Recommendations  AACE/ADA: New Consensus Statement on Inpatient Glycemic Control  Target Ranges:  Prepandial:   less than 140 mg/dL      Peak postprandial:   less than 180 mg/dL (1-2 hours)      Critically ill patients:  140 - 180 mg/dL    Latest Reference Range & Units 08/22/21 07:55 08/22/21 11:30 08/22/21 16:14 08/22/21 20:34 08/23/21 07:52 08/23/21 07:58  Glucose-Capillary 70 - 99 mg/dL 865 (H) 784 (H) 696 (H) 357 (H) 46 (L) 46 (L)   Review of Glycemic Control  Diabetes history: DM Outpatient Diabetes medications: Lantus 60 units every 3 days, Novolog 15 units TID with meals Current orders for Inpatient glycemic control: Levemir 30 units BID, Novolog 0-15 units TID with meals, Novolog 0-5 units QHS, Novolog 5 units TID with meals  Inpatient Diabetes Program Recommendations:    Insulin: Was ordered Hydrocortisone but steroids no longer ordered. Fasting glucose 46 mg/dl today. Please decrease Levemir to 20 units BID and increase meal coverage insulin to Novolog 10 units TID with meals.  Outpatient: Please consider adjusting dose of outpatient Lantus and Novolog dose to prevent reoccurrence of hypoglycemia; perhaps Lantus 20 units BID and Novolog 10 units TID with meals.  Also, patient would like to get Rx for FreeStyle Libre2 CGM [phone]) and Baqsimi (#[id number]) at discharge.  Addendum 08/23/21@11 :05-Spoke with patient about diabetes and home regimen for diabetes control. Patient reports being followed by PCP but she would like to get established with local Endocrinologist. Encouraged patient to call [facility] Endocrinology to see if she can get established with Endocrinologist for assistance with DM management. Patient states that she is having hypoglycemia frequently and she is fearful of hypoglycemia.  Patient states that her mother has had to give her glucagon in the past and that sometimes when her glucose is low she is so shaky and unstable that she is not able  to get to the kitchen to get something to treat hypoglycemia. Patient reports that she has used FreeStyle Libre2 CGM sensors in the past but she has had an issues with insurance approval to cover it. Provided patient with FreeStyle Libre2 CGM sensor and she plans to apply at home and use the app on her phone to read it. Encouraged patient to contact her PCP and ask them to work with insurance if she is told that the [place] is not covered again at the pharmacy.  Patient would like to get Rx for glucagon. Discussed Baqsimi (nasal glucagon) and patient would prefer nasal versus IM glucagon. Will ask attending to provide Rx for Baqsimi.  Inforemd patient that it would be recommended that provider decrease outpatient Lantus and Novolog meal coverage but up to discharging provider to make the changes or not. Discussed that insulin may need to be adjusted outpatient if she continues to have more hypoglycemia at home. Encouraged patient to keep a detailed log of glucose readings, carbohydrates consumed, and insulin taken.  Asked patient to be sure to contact Endocrinology office to get established with Endocrinologist and to be sure to take glucose log and access to [place] app with her to appointments so providers can use the information to continue to make adjustments.  Patient verbalized understanding of information discussed and reports no further questions at this time related to diabetes.  Thanks, Orlando Penner, RN, MSN, CDCES Diabetes Coordinator Inpatient Diabetes Program [phone] (Team Pager from 8am to 5pm)

## 2021-08-23 NOTE — TOC Transition Note (Signed)
Transition of Care [facility]) - CM/SW Discharge Note   Patient Details  Name: Nancy [name] MRN: [id number] Date of Birth: 2000/05/23  [facility] CM/SW Contact:  Margarito Liner, LCSW Phone Number: 08/23/2021, 3:59 PM   Clinical Narrative:  Patient has orders to discharge home today. No further concerns. CSW signing off.   Final next level of care: Home/Self Care Barriers to Discharge: Barriers Resolved   Patient Goals and CMS Choice        Discharge Placement                Patient to be transferred to facility by: Mom   Patient and family notified of of transfer: 08/23/21  Discharge Plan and Services     Post Acute Care Choice: NA                               Social Determinants of Health (SDOH) Interventions     Readmission Risk Interventions    08/22/2021   10:32 AM 11/26/2019   11:12 AM 08/08/2019    3:57 PM  Readmission Risk Prevention Plan  Transportation Screening Complete Complete Complete  Medication Review (RN Care Manager) Complete  Complete  PCP or Specialist appointment within 3-5 days of discharge Complete Not Complete Complete  PCP/Specialist Appt Not Complete comments  Unable to reach Dr Zollie Scale office on Friday.  LM.  Discussed with pt that she should call if goes home on weekend.   HRI or Home Care Consult  Complete Complete  SW Recovery Care/Counseling Consult Complete Complete Complete  Palliative Care Screening Not Applicable Not Applicable Not Applicable  Skilled Nursing Facility Not Applicable Not Applicable Not Applicable

## 2021-08-23 NOTE — Discharge Summary (Signed)
Nancy [name]:[id number] DOB: September 28, 2000 DOA: 08/20/2021  PCP: Fleet Contras, MD  Admit date: 08/20/2021 Discharge date: 08/23/2021  Time spent: 40 minutes  Recommendations for Outpatient Follow-up:  Pcp f/u Endocrinology f/u     Discharge Diagnoses:  Principal Problem:   Thyroid crisis or storm Active Problems:   Obesity   Hypertension   Polysubstance abuse [facility])   Discharge Condition: improved  Diet recommendation: carb modified  Filed Weights   08/21/21 0100 08/22/21 0448  Weight: 76.4 kg 80.8 kg    History of present illness:  From admission h and p Nancy [name] is a 21 year old woman with DMI, seizure disorder and congestive heart failure who presented to the ER after being found unresponsive at home. She reports low blood sugars over recent days despite not using her insulin. EMS was called after she was found by family. Blood sugar was 35 on arrival and she was started on D10 infusion. She reports abdominal pain, nausea, vomiting and diarrhea. She reports racing heart and diaphoresis. She also has a tremor of her hands.   In the ER her blood glucose continued to require D50 pushes. She was tachycardic into the 140s-150s. Labs showed TSH <0.010 and Free T4 >5.5.   Hospital Course:   # Hyperthyroid # Concern for thyroid storm Abdominal pain, diarrhea, tachycardia. No hyperpyrexia though. Was altered though also hypoglycemic at the time. Treating as though this is thyroid storm. Was initially managed in the ICU and then transferred to hospitalist service. Thyroid u/s with heterogeneous thyroid. Thyroid antibodies elevated. Has hx of chf, appears to be relatively compensated and TTE here normal. Treated initially with PTU, propranolol, hydrocortisone, and lugol's iodine. On 8/17 attending provider discussed case w/ Dr. Tedd Sias of [facility] endocrinology. Given normal mentation Dr. Tedd Sias advised patient no longer in storm and advised stopping ptu and hydrocortisone and  lugol's and starting methimazole at full dose of 20, continuing propranolol, and f/u in endocrinology clinic.    # T1DM Presented with severe hypoglycemia now resolved, glucose educator followed, discharged with adjustment to home insulin, CGM, and advising close PCP and endocrinology f/u    # Diarrhea Likely consequence of hyperthyroidism. Gi pathogen panel and c diff negative. Improved with treatment of hyperthyroidism and loperamide   Procedures: none   Consultations: none  Discharge Exam: Vitals:   08/23/21 0801 08/23/21 1210  BP: (!) 144/80 129/60  Pulse: 90 98  Resp: 20 20  Temp: 97.7 F (36.5 C) 97.7 F (36.5 C)  SpO2: 100% 97%    General exam: Appears calm and comfortable  Respiratory system: Clear to auscultation. Respiratory effort normal. Cardiovascular system: S1 & S2 heard, tachycardic. No JVD, murmurs, rubs, gallops or clicks.  Gastrointestinal system: Abdomen is nondistended, soft and nontender. No organomegaly or masses felt. Normal bowel sounds heard. Central nervous system: Alert and oriented. No focal neurological deficits. Extremities: Symmetric 5 x 5 power. Edema to knees Skin: No rashes, lesions or ulcers Psychiatry: Judgement and insight appear normal. Mood & affect appropriate.   Discharge Instructions   Discharge Instructions     Diet Carb Modified   Complete by: As directed    Increase activity slowly   Complete by: As directed       Allergies as of 08/23/2021       Reactions   Oatmeal Anaphylaxis   Tomato Anaphylaxis   Ibuprofen Other (See Comments)   GI MD said to not take this anymore        Medication List  STOP taking these medications    atorvastatin 20 MG tablet Commonly known as: LIPITOR       TAKE these medications    ARIPiprazole 10 MG tablet Commonly known as: ABILIFY Take 10 mg by mouth in the morning and at bedtime.   Baqsimi Two Pack 3 MG/DOSE Powd Generic drug: Glucagon Place 1 spray into the nose  as needed.   capsaicin 0.025 % cream Commonly known as: ZOSTRIX Apply topically 2 (two) times daily.   diclofenac Sodium 1 % Gel Commonly known as: VOLTAREN Apply 1 application topically 2 (two) times daily as needed (chest pains).   famotidine 20 MG tablet Commonly known as: PEPCID Take 1 tablet (20 mg total) by mouth 2 (two) times daily.   FreeStyle Libre 2 Sensor Misc Use as directed   insulin glargine 100 unit/mL Sopn Commonly known as: LANTUS Inject 20 Units into the skin 2 (two) times daily. What changed:  how much to take when to take this   loperamide 2 MG capsule Commonly known as: IMODIUM Take 2 capsules (4 mg total) by mouth as needed for diarrhea or loose stools.   losartan 25 MG tablet Commonly known as: COZAAR Take 1 tablet (25 mg total) by mouth daily.   methimazole 10 MG tablet Commonly known as: TAPAZOLE Take 2 tablets (20 mg total) by mouth daily. Start taking on: August 24, 2021   metoCLOPramide 10 MG tablet Commonly known as: REGLAN Take 1 tablet (10 mg total) by mouth 3 (three) times daily with meals.   multivitamin with minerals Tabs tablet Take 1 tablet by mouth daily.   NovoLOG FlexPen 100 UNIT/ML FlexPen Generic drug: insulin aspart Inject 10 Units into the skin 2 (two) times daily before a meal. What changed: how much to take   ondansetron 4 MG disintegrating tablet Commonly known as: ZOFRAN-ODT Take 1 tablet (4 mg total) by mouth every 8 (eight) hours as needed.   promethazine 25 MG suppository Commonly known as: PHENERGAN Place 1 suppository (25 mg total) rectally every 8 (eight) hours as needed for nausea or vomiting.   propranolol 80 MG tablet Commonly known as: INDERAL Take 1 tablet (80 mg total) by mouth 3 (three) times daily.   QUEtiapine 100 MG tablet Commonly known as: SEROQUEL Take 300 mg by mouth at bedtime. What changed: Another medication with the same name was removed. Continue taking this medication, and follow  the directions you see here.       Allergies  Allergen Reactions   Oatmeal Anaphylaxis   Tomato Anaphylaxis   Ibuprofen Other (See Comments)    GI MD said to not take this anymore    Follow-up Information     Solum, Marlana Salvage, MD Follow up.   Specialty: Endocrinology Contact information: [phone] [facility] [address] [facility] [place] [address] [phone]         Fleet Contras, MD Follow up.   Specialty: Internal Medicine Contact information: [address] [place] [address] [phone]                  The results of significant diagnostics from this hospitalization (including imaging, microbiology, ancillary and laboratory) are listed below for reference.    Significant Diagnostic Studies: ECHOCARDIOGRAM COMPLETE  Result Date: 08/23/2021    ECHOCARDIOGRAM REPORT   Patient Name:   Nancy [name] Date of Exam: 08/22/2021 Medical Rec #:  [id number]        Height:       62.0 in Accession #:  [id number]       Weight:       178.1 lb Date of Birth:  04-20-2000        BSA:          1.820 m Patient Age:    21 years         BP:           156/78 mmHg Patient Gender: F                HR:           112 bpm. Exam Location:  [facility] Procedure: 2D Echo, Color Doppler and Cardiac Doppler Indications:     Thyroid storm  History:         Patient has prior history of Echocardiogram examinations, most                  recent 12/01/2018. CHF; Risk Factors:Diabetes.  Sonographer:     Humphrey Rolls Referring Phys:  [id number] Wilfred Curtis IAXK Diagnosing Phys: Julien Nordmann MD  Sonographer Comments: No subcostal window. IMPRESSIONS  1. Left ventricular ejection fraction, by estimation, is 60 to 65%. The left ventricle has normal function. The left ventricle has no regional wall motion abnormalities. Left ventricular diastolic parameters were normal.  2. Right ventricular systolic function is normal. The right ventricular size is normal. Tricuspid regurgitation signal is inadequate  for assessing PA pressure.  3. The mitral valve is normal in structure. No evidence of mitral valve regurgitation. No evidence of mitral stenosis.  4. The aortic valve is normal in structure. Aortic valve regurgitation is not visualized. No aortic stenosis is present.  5. The inferior vena cava is normal in size with greater than 50% respiratory variability, suggesting right atrial pressure of 3 mmHg. FINDINGS  Left Ventricle: Left ventricular ejection fraction, by estimation, is 60 to 65%. The left ventricle has normal function. The left ventricle has no regional wall motion abnormalities. The left ventricular internal cavity size was normal in size. There is  no left ventricular hypertrophy. Left ventricular diastolic parameters were normal. Right Ventricle: The right ventricular size is normal. No increase in right ventricular wall thickness. Right ventricular systolic function is normal. Tricuspid regurgitation signal is inadequate for assessing PA pressure. Left Atrium: Left atrial size was normal in size. Right Atrium: Right atrial size was normal in size. Pericardium: There is no evidence of pericardial effusion. Mitral Valve: The mitral valve is normal in structure. No evidence of mitral valve regurgitation. No evidence of mitral valve stenosis. Tricuspid Valve: The tricuspid valve is normal in structure. Tricuspid valve regurgitation is mild . No evidence of tricuspid stenosis. Aortic Valve: The aortic valve is normal in structure. Aortic valve regurgitation is not visualized. No aortic stenosis is present. Aortic valve mean gradient measures 7.0 mmHg. Aortic valve peak gradient measures 14.7 mmHg. Aortic valve area, by VTI measures 3.17 cm. Pulmonic Valve: The pulmonic valve was normal in structure. Pulmonic valve regurgitation is not visualized. No evidence of pulmonic stenosis. Aorta: The aortic root is normal in size and structure. Venous: The inferior vena cava is normal in size with greater than 50%  respiratory variability, suggesting right atrial pressure of 3 mmHg. IAS/Shunts: No atrial level shunt detected by color flow Doppler.  LEFT VENTRICLE PLAX 2D LVIDd:         5.17 cm   Diastology LVIDs:         2.91 cm   LV e' medial:    10.20 cm/s LV  PW:         1.00 cm   LV E/e' medial:  14.4 LV IVS:        0.90 cm   LV e' lateral:   13.20 cm/s LVOT diam:     2.10 cm   LV E/e' lateral: 11.1 LV SV:         94 LV SV Index:   51 LVOT Area:     3.46 cm  RIGHT VENTRICLE RV Basal diam:  3.83 cm TAPSE (M-mode): 3.3 cm LEFT ATRIUM             Index        RIGHT ATRIUM           Index LA diam:        4.40 cm 2.42 cm/m   RA Area:     18.50 cm LA Vol (A2C):   63.3 ml 34.78 ml/m  RA Volume:   50.60 ml  27.80 ml/m LA Vol (A4C):   60.6 ml 33.30 ml/m LA Biplane Vol: 62.0 ml 34.07 ml/m  AORTIC VALVE                     PULMONIC VALVE AV Area (Vmax):    2.96 cm      PV Vmax:       1.23 m/s AV Area (Vmean):   3.38 cm      PV Peak grad:  6.1 mmHg AV Area (VTI):     3.17 cm AV Vmax:           192.00 cm/s AV Vmean:          122.000 cm/s AV VTI:            0.295 m AV Peak Grad:      14.7 mmHg AV Mean Grad:      7.0 mmHg LVOT Vmax:         164.00 cm/s LVOT Vmean:        119.000 cm/s LVOT VTI:          0.270 m LVOT/AV VTI ratio: 0.92  AORTA Ao Root diam: 2.70 cm MITRAL VALVE MV Area (PHT): 4.71 cm     SHUNTS MV Decel Time: 161 msec     Systemic VTI:  0.27 m MV E velocity: 147.00 cm/s  Systemic Diam: 2.10 cm MV A velocity: 106.00 cm/s MV E/A ratio:  1.39 Julien Nordmann MD Electronically signed by Julien Nordmann MD Signature Date/Time: 08/23/2021/11:08:22 AM    Final    US THYROID  Result Date: 08/21/2021 CLINICAL DATA:  Hyperthyroid. EXAM: THYROID ULTRASOUND TECHNIQUE: Ultrasound examination of the thyroid gland and adjacent soft tissues was performed. COMPARISON:  None Available. FINDINGS: Parenchymal Echotexture: Mildly heterogeneous, mild glandular hyperemia Isthmus: 1.6 cm Right lobe: 6.5 x 3.4 x 2.8 cm Left lobe: 6.6 x  3.4 x 2.9 cm ________________________________________________________ Estimated total number of nodules >/= 1 cm: 0 Number of spongiform nodules >/=  2 cm not described below (TR1): 0 Number of mixed cystic and solid nodules >/= 1.5 cm not described below (TR2): 0 _________________________________________________________ No discrete nodules are seen within the thyroid gland. No cervical lymphadenopathy. IMPRESSION: Moderately enlarged, mildly heterogeneous thyroid gland with mild diffuse hyperemia. These findings are nonspecific but could be seen with thyroiditis or autoimmune thyroid disorder. The above is in keeping with the ACR TI-RADS recommendations - J Am Coll Radiol 2017;14:587-595. Marliss Coots, MD [facility] Radiology Electronically Signed   By: Domingo Dimes  Suttle M.D.   On: 08/21/2021 11:30   DG Chest Port 1 View  Result Date: 08/21/2021 CLINICAL DATA:  Found unresponsive on the floor EXAM: PORTABLE CHEST 1 VIEW COMPARISON:  Radiographs 06/28/2021 FINDINGS: New hazy bilateral airspace and interstitial opacities suggestive of edema. Stable mild cardiomegaly. No pleural effusion or pneumothorax. No acute osseous abnormality. IMPRESSION: Mild pulmonary edema.  Cardiomegaly. Electronically Signed   By: Minerva Fester M.D.   On: 08/21/2021 00:18   DG Knee Complete 4 Views Left  Result Date: 08/06/2021 CLINICAL DATA:  Pain after fall EXAM: LEFT KNEE - COMPLETE 4+ VIEW COMPARISON:  No prior dedicated knee radiographs, correlation is made with 10/19/2019 femur radiographs FINDINGS: No evidence of fracture, dislocation, or joint effusion. No evidence of arthropathy or other focal bone abnormality. Soft tissues are unremarkable. IMPRESSION: Negative. Electronically Signed   By: Wiliam Ke M.D.   On: 08/06/2021 13:44   CT HEAD WO CONTRAST ( )  Result Date: 08/06/2021 CLINICAL DATA:  Grand mal seizure. EXAM: CT HEAD WITHOUT CONTRAST TECHNIQUE: Contiguous  axial images were obtained from the base of the skull through the vertex without intravenous contrast. RADIATION DOSE REDUCTION: This exam was performed according to the departmental dose-optimization program which includes automated exposure control, adjustment of the mA and/or kV according to patient size and/or use of iterative reconstruction technique. COMPARISON:  06/02/2021 FINDINGS: Brain: The ventricles are normal in size and configuration. No extra-axial fluid collections are identified. The gray-white differentiation is maintained. No CT findings for acute hemispheric infarction or intracranial hemorrhage. No mass lesions. The brainstem and cerebellum are normal. Vascular: No hyperdense vessels or obvious aneurysm. Skull: No acute skull fracture. No bone lesion. Sinuses/Orbits: The paranasal sinuses and mastoid air cells are clear. The globes are intact. Other: No scalp lesions, laceration or hematoma. IMPRESSION: Normal head CT. Electronically Signed   By: Rudie Meyer M.D.   On: 08/06/2021 12:49    Microbiology: Recent Results (from the past 240 hour(s))  Culture, blood (Routine X 2) w Reflex to ID Panel     Status: None (Preliminary result)   Collection Time: 08/22/21  9:40 AM   Specimen: BLOOD  Result Value Ref Range Status   Specimen Description BLOOD RIGHT [facility]  Final   Special Requests   Final    BOTTLES DRAWN AEROBIC ONLY Blood Culture results may not be optimal due to an inadequate volume of blood received in culture bottles   Culture   Final    NO GROWTH < 24 HOURS Performed at [facility], [address] [place], [place] [address]    Report Status PENDING  Incomplete  Culture, blood (Routine X 2) w Reflex to ID Panel     Status: None (Preliminary result)   Collection Time: 08/22/21  9:40 AM   Specimen: BLOOD LEFT HAND  Result Value Ref Range Status   Specimen Description BLOOD LEFT HAND  Final   Special Requests   Final    BOTTLES DRAWN AEROBIC AND ANAEROBIC  Blood Culture adequate volume   Culture   Final    NO GROWTH < 24 HOURS Performed at [facility], [address], [place], [place] [address]    Report Status PENDING  Incomplete  C Difficile Quick Screen w PCR reflex     Status: None   Collection Time: 08/22/21 11:00 AM   Specimen: STOOL  Result Value Ref Range Status   C Diff antigen NEGATIVE NEGATIVE Final   C Diff toxin NEGATIVE NEGATIVE Final  C Diff interpretation No C. difficile detected.  Final    Comment: Performed at [facility], [address] [place], [place] [address]  Gastrointestinal Panel by PCR , Stool     Status: None   Collection Time: 08/22/21 11:00 AM   Specimen: STOOL  Result Value Ref Range Status   Campylobacter species NOT DETECTED NOT DETECTED Final   Plesimonas shigelloides NOT DETECTED NOT DETECTED Final   Salmonella species NOT DETECTED NOT DETECTED Final   Yersinia enterocolitica NOT DETECTED NOT DETECTED Final   Vibrio species NOT DETECTED NOT DETECTED Final   Vibrio cholerae NOT DETECTED NOT DETECTED Final   Enteroaggregative E coli (EAEC) NOT DETECTED NOT DETECTED Final   Enteropathogenic E coli (EPEC) NOT DETECTED NOT DETECTED Final   Enterotoxigenic E coli (ETEC) NOT DETECTED NOT DETECTED Final   Shiga like toxin producing E coli (STEC) NOT DETECTED NOT DETECTED Final   Shigella/Enteroinvasive E coli (EIEC) NOT DETECTED NOT DETECTED Final   Cryptosporidium NOT DETECTED NOT DETECTED Final   Cyclospora cayetanensis NOT DETECTED NOT DETECTED Final   Entamoeba histolytica NOT DETECTED NOT DETECTED Final   Giardia lamblia NOT DETECTED NOT DETECTED Final   Adenovirus F40/41 NOT DETECTED NOT DETECTED Final   Astrovirus NOT DETECTED NOT DETECTED Final   Norovirus GI/GII NOT DETECTED NOT DETECTED Final   Rotavirus A NOT DETECTED NOT DETECTED Final   Sapovirus (I, II, IV, and V) NOT DETECTED NOT DETECTED Final    Comment: Performed at [facility], [address]  [place], [place] [address]     Labs: Basic Metabolic Panel: Recent Labs  Lab 08/20/21 1804 08/21/21 0354 08/22/21 0613 08/23/21 0509 08/23/21 1438  NA 144 138 138 143 141  K 3.5 3.9 3.7 2.8* 3.5  CL 113* 108 107 113* 108  CO2 GLUCOSE 58* 107* 302* 76 179*  BUN CREATININE <0.30* <0.30* 0.35* <0.30* 0.32*  CALCIUM 9.5 9.4 8.8* 9.2 8.9  MG 1.7 2.1 2.1  --  1.7  PHOS  --  4.5 3.4  --   --    Liver Function Tests: Recent Labs  Lab 08/20/21 1804  AST 36  ALT 22  ALKPHOS 100  BILITOT 0.8  PROT 6.1*  ALBUMIN 3.1*   No results for input(s): "LIPASE", "AMYLASE" in the last 168 hours. No results for input(s): "AMMONIA" in the last 168 hours. CBC: Recent Labs  Lab 08/20/21 1804 08/21/21 0354  WBC 6.3 7.8  NEUTROABS 4.2  --   HGB 8.5* 8.1*  HCT 27.8* 25.6*  MCV 77.4* 74.4*  PLT 235 233   Cardiac Enzymes: No results for input(s): "CKTOTAL", "CKMB", "CKMBINDEX", "TROPONINI" in the last 168 hours. BNP: BNP (last 3 results) Recent Labs    08/21/21 0104  BNP 425.9*    ProBNP (last 3 results) No results for input(s): "PROBNP" in the last 8760 hours.  CBG: Recent Labs  Lab 08/22/21 2034 08/23/21 0752 08/23/21 0758 08/23/21 0846 08/23/21 1154  GLUCAP 357* 46* 46* 150* 192*       Signed:  Silvano Bilis MD.  [facility] 08/23/2021, 4:05 PM

## 2021-08-24 LAB — T3: T3, Total: 183 ng/dL — ABNORMAL HIGH (ref 71–180)

## 2021-08-26 ENCOUNTER — Ambulatory Visit: Payer: Medicaid Other | Admitting: Gastroenterology

## 2021-08-28 LAB — CULTURE, BLOOD (ROUTINE X 2)
Culture: NO GROWTH
Culture: NO GROWTH
Special Requests: ADEQUATE

## 2021-10-03 ENCOUNTER — Inpatient Hospital Stay
Admission: EM | Admit: 2021-10-03 | Discharge: 2021-10-05 | DRG: 644 | Disposition: A | Discharge status: Home / Self Care | Payer: Medicaid Other | Attending: Hospitalist | Admitting: Hospitalist

## 2021-10-03 ENCOUNTER — Inpatient Hospital Stay: Payer: Medicaid Other

## 2021-10-03 ENCOUNTER — Other Ambulatory Visit: Payer: Self-pay

## 2021-10-03 ENCOUNTER — Encounter: Payer: Self-pay | Admitting: Emergency Medicine

## 2021-10-03 ENCOUNTER — Emergency Department: Payer: Medicaid Other

## 2021-10-03 DIAGNOSIS — E0590 Thyrotoxicosis, unspecified without thyrotoxic crisis or storm: Principal | ICD-10-CM | POA: Diagnosis present

## 2021-10-03 DIAGNOSIS — E1010 Type 1 diabetes mellitus with ketoacidosis without coma: Secondary | ICD-10-CM

## 2021-10-03 DIAGNOSIS — Z8049 Family history of malignant neoplasm of other genital organs: Secondary | ICD-10-CM | POA: Diagnosis not present

## 2021-10-03 DIAGNOSIS — I5032 Chronic diastolic (congestive) heart failure: Secondary | ICD-10-CM | POA: Diagnosis present

## 2021-10-03 DIAGNOSIS — I110 Hypertensive heart disease with heart failure: Secondary | ICD-10-CM | POA: Diagnosis present

## 2021-10-03 DIAGNOSIS — E109 Type 1 diabetes mellitus without complications: Secondary | ICD-10-CM | POA: Diagnosis present

## 2021-10-03 DIAGNOSIS — Z833 Family history of diabetes mellitus: Secondary | ICD-10-CM

## 2021-10-03 DIAGNOSIS — Z79899 Other long term (current) drug therapy: Secondary | ICD-10-CM

## 2021-10-03 DIAGNOSIS — Z886 Allergy status to analgesic agent status: Secondary | ICD-10-CM | POA: Diagnosis not present

## 2021-10-03 DIAGNOSIS — Z79890 Hormone replacement therapy (postmenopausal): Secondary | ICD-10-CM | POA: Diagnosis not present

## 2021-10-03 DIAGNOSIS — R259 Unspecified abnormal involuntary movements: Secondary | ICD-10-CM

## 2021-10-03 DIAGNOSIS — M545 Low back pain, unspecified: Secondary | ICD-10-CM | POA: Diagnosis present

## 2021-10-03 DIAGNOSIS — R569 Unspecified convulsions: Secondary | ICD-10-CM | POA: Diagnosis present

## 2021-10-03 DIAGNOSIS — Z825 Family history of asthma and other chronic lower respiratory diseases: Secondary | ICD-10-CM

## 2021-10-03 DIAGNOSIS — Z800 Family history of malignant neoplasm of digestive organs: Secondary | ICD-10-CM

## 2021-10-03 DIAGNOSIS — G8929 Other chronic pain: Secondary | ICD-10-CM | POA: Diagnosis present

## 2021-10-03 DIAGNOSIS — Z20822 Contact with and (suspected) exposure to covid-19: Secondary | ICD-10-CM | POA: Diagnosis present

## 2021-10-03 DIAGNOSIS — Z8249 Family history of ischemic heart disease and other diseases of the circulatory system: Secondary | ICD-10-CM

## 2021-10-03 DIAGNOSIS — Z794 Long term (current) use of insulin: Secondary | ICD-10-CM

## 2021-10-03 DIAGNOSIS — Z91018 Allergy to other foods: Secondary | ICD-10-CM

## 2021-10-03 DIAGNOSIS — G9341 Metabolic encephalopathy: Secondary | ICD-10-CM | POA: Diagnosis present

## 2021-10-03 LAB — CBC WITH DIFFERENTIAL/PLATELET
Abs Immature Granulocytes: 0.01 10*3/uL (ref 0.00–0.07)
Basophils Absolute: 0 10*3/uL (ref 0.0–0.1)
Basophils Relative: 0 %
Eosinophils Absolute: 0 10*3/uL (ref 0.0–0.5)
Eosinophils Relative: 0 %
HCT: 31.3 % — ABNORMAL LOW (ref 36.0–46.0)
Hemoglobin: 9.9 g/dL — ABNORMAL LOW (ref 12.0–15.0)
Immature Granulocytes: 0 %
Lymphocytes Relative: 23 %
Lymphs Abs: 1.2 10*3/uL (ref 0.7–4.0)
MCH: 23.8 pg — ABNORMAL LOW (ref 26.0–34.0)
MCHC: 31.6 g/dL (ref 30.0–36.0)
MCV: 75.2 fL — ABNORMAL LOW (ref 80.0–100.0)
Monocytes Absolute: 0.4 10*3/uL (ref 0.1–1.0)
Monocytes Relative: 7 %
Neutro Abs: 3.6 10*3/uL (ref 1.7–7.7)
Neutrophils Relative %: 70 %
Platelets: 181 10*3/uL (ref 150–400)
RBC: 4.16 MIL/uL (ref 3.87–5.11)
RDW: 14.3 % (ref 11.5–15.5)
WBC: 5.1 10*3/uL (ref 4.0–10.5)
nRBC: 0 % (ref 0.0–0.2)

## 2021-10-03 LAB — URINE DRUG SCREEN, QUALITATIVE (ARMC ONLY)
Amphetamines, Ur Screen: NOT DETECTED
Barbiturates, Ur Screen: NOT DETECTED
Benzodiazepine, Ur Scrn: POSITIVE — AB
Cannabinoid 50 Ng, Ur ~~LOC~~: POSITIVE — AB
Cocaine Metabolite,Ur ~~LOC~~: NOT DETECTED
MDMA (Ecstasy)Ur Screen: NOT DETECTED
Methadone Scn, Ur: NOT DETECTED
Opiate, Ur Screen: POSITIVE — AB
Phencyclidine (PCP) Ur S: NOT DETECTED
Tricyclic, Ur Screen: NOT DETECTED

## 2021-10-03 LAB — URINALYSIS, ROUTINE W REFLEX MICROSCOPIC
Bacteria, UA: NONE SEEN
Bilirubin Urine: NEGATIVE
Glucose, UA: 50 mg/dL — AB
Hgb urine dipstick: NEGATIVE
Ketones, ur: 20 mg/dL — AB
Leukocytes,Ua: NEGATIVE
Nitrite: NEGATIVE
Protein, ur: 30 mg/dL — AB
Specific Gravity, Urine: 1.014 (ref 1.005–1.030)
pH: 9 — ABNORMAL HIGH (ref 5.0–8.0)

## 2021-10-03 LAB — COMPREHENSIVE METABOLIC PANEL
ALT: 24 U/L (ref 0–44)
AST: 33 U/L (ref 15–41)
Albumin: 3.8 g/dL (ref 3.5–5.0)
Alkaline Phosphatase: 81 U/L (ref 38–126)
Anion gap: 10 (ref 5–15)
BUN: 12 mg/dL (ref 6–20)
CO2: 28 mmol/L (ref 22–32)
Calcium: 10.7 mg/dL — ABNORMAL HIGH (ref 8.9–10.3)
Chloride: 105 mmol/L (ref 98–111)
Creatinine, Ser: <0.3 mg/dL — ABNORMAL LOW (ref 0.44–1.00)
Glucose, Bld: 139 mg/dL — ABNORMAL HIGH (ref 70–99)
Potassium: 3.4 mmol/L — ABNORMAL LOW (ref 3.5–5.1)
Sodium: 143 mmol/L (ref 135–145)
Total Bilirubin: 1.5 mg/dL — ABNORMAL HIGH (ref 0.3–1.2)
Total Protein: 6.8 g/dL (ref 6.5–8.1)

## 2021-10-03 LAB — POC URINE PREG, ED: Preg Test, Ur: NEGATIVE

## 2021-10-03 LAB — LACTIC ACID, PLASMA
Lactic Acid, Venous: 1.5 mmol/L (ref 0.5–1.9)
Lactic Acid, Venous: 1.1 mmol/L (ref 0.5–1.9)

## 2021-10-03 LAB — D-DIMER, QUANTITATIVE: D-Dimer, Quant: 0.54 ug/mL-FEU — ABNORMAL HIGH (ref 0.00–0.50)

## 2021-10-03 LAB — GLUCOSE, CAPILLARY
Glucose-Capillary: 178 mg/dL — ABNORMAL HIGH (ref 70–99)
Glucose-Capillary: 143 mg/dL — ABNORMAL HIGH (ref 70–99)
Glucose-Capillary: 132 mg/dL — ABNORMAL HIGH (ref 70–99)
Glucose-Capillary: 255 mg/dL — ABNORMAL HIGH (ref 70–99)

## 2021-10-03 LAB — TROPONIN I (HIGH SENSITIVITY)
Troponin I (High Sensitivity): 14 ng/L (ref <18)
Troponin I (High Sensitivity): 17 ng/L (ref <18)

## 2021-10-03 LAB — MAGNESIUM: Magnesium: 1.5 mg/dL — ABNORMAL LOW (ref 1.7–2.4)

## 2021-10-03 LAB — PHOSPHORUS: Phosphorus: 4.7 mg/dL — ABNORMAL HIGH (ref 2.5–4.6)

## 2021-10-03 LAB — PREGNANCY, URINE: Preg Test, Ur: NEGATIVE

## 2021-10-03 LAB — TSH: TSH: <0.01 u[IU]/mL — ABNORMAL LOW (ref 0.350–4.500)

## 2021-10-03 LAB — T4, FREE: Free T4: >5.5 ng/dL — ABNORMAL HIGH (ref 0.61–1.12)

## 2021-10-03 LAB — BRAIN NATRIURETIC PEPTIDE: B Natriuretic Peptide: 959.9 pg/mL — ABNORMAL HIGH (ref 0.0–100.0)

## 2021-10-03 MED ORDER — DOCUSATE SODIUM 100 MG PO CAPS: 100.0000 mg | ORAL_CAPSULE | Freq: Two times a day (BID) | ORAL | Status: DC | PRN

## 2021-10-03 MED ORDER — SODIUM CHLORIDE 0.9 % IV SOLN
12.5000 mg | Freq: Four times a day (QID) | INTRAVENOUS | Status: DC | PRN
  Administered 2021-10-03: 12.5 mg via INTRAVENOUS
  Filled 2021-10-03: qty 12.5

## 2021-10-03 MED ORDER — POTASSIUM CHLORIDE 10 MEQ/100ML IV SOLN
10.0000 meq | INTRAVENOUS | Status: AC
  Administered 2021-10-03 (×3): 10 meq via INTRAVENOUS
  Filled 2021-10-03 (×3): qty 100

## 2021-10-03 MED ORDER — QUETIAPINE FUMARATE 300 MG PO TABS
300.0000 mg | ORAL_TABLET | Freq: Every day | ORAL | Status: DC
  Administered 2021-10-03 – 2021-10-04 (×2): 300 mg via ORAL
  Filled 2021-10-03 (×2): qty 1

## 2021-10-03 MED ORDER — ESMOLOL HCL-SODIUM CHLORIDE 2000 MG/100ML IV SOLN
25.0000 ug/kg/min | INTRAVENOUS | Status: DC
  Filled 2021-10-03: qty 100

## 2021-10-03 MED ORDER — PROPRANOLOL HCL 1 MG/ML IV SOLN
2.0000 mg | Freq: Once | INTRAVENOUS | Status: DC
  Filled 2021-10-03: qty 2

## 2021-10-03 MED ORDER — HYDROCORTISONE SOD SUC (PF) 100 MG IJ SOLR
100.0000 mg | Freq: Three times a day (TID) | INTRAMUSCULAR | Status: DC
  Administered 2021-10-03 – 2021-10-04 (×3): 100 mg via INTRAVENOUS
  Filled 2021-10-03 (×4): qty 2

## 2021-10-03 MED ORDER — PROPRANOLOL HCL 20 MG PO TABS: 60.0000 mg | ORAL_TABLET | Freq: Four times a day (QID) | ORAL | Status: DC

## 2021-10-03 MED ORDER — METHIMAZOLE 10 MG PO TABS
20.0000 mg | ORAL_TABLET | Freq: Once | ORAL | Status: DC
Start: 2021-10-03 — End: 2021-10-03
  Filled 2021-10-03: qty 2

## 2021-10-03 MED ORDER — LACTATED RINGERS IV BOLUS
500.0000 mL | Freq: Once | INTRAVENOUS | Status: AC
  Administered 2021-10-03: 500 mL via INTRAVENOUS

## 2021-10-03 MED ORDER — HYDROCORTISONE SOD SUC (PF) 100 MG IJ SOLR
300.0000 mg | Freq: Once | INTRAMUSCULAR | Status: AC
  Administered 2021-10-03: 300 mg via INTRAVENOUS
  Filled 2021-10-03: qty 6

## 2021-10-03 MED ORDER — MAGNESIUM SULFATE 4 GM/100ML IV SOLN
4.0000 g | Freq: Once | INTRAVENOUS | Status: AC
  Administered 2021-10-03: 4 g via INTRAVENOUS
  Filled 2021-10-03: qty 100

## 2021-10-03 MED ORDER — LORAZEPAM 2 MG/ML IJ SOLN
1.0000 mg | Freq: Once | INTRAMUSCULAR | Status: AC
  Administered 2021-10-03: 1 mg via INTRAVENOUS
  Filled 2021-10-03: qty 1

## 2021-10-03 MED ORDER — IODINE STRONG (LUGOLS) 5 % PO SOLN
0.2000 mL | Freq: Three times a day (TID) | ORAL | Status: DC
  Administered 2021-10-03 – 2021-10-04 (×4): 0.2 mL via ORAL
  Filled 2021-10-03 (×7): qty 0.2

## 2021-10-03 MED ORDER — PROPYLTHIOURACIL 50 MG PO TABS
250.0000 mg | ORAL_TABLET | Freq: Three times a day (TID) | ORAL | Status: DC
  Administered 2021-10-03 – 2021-10-05 (×6): 250 mg via ORAL
  Filled 2021-10-03 (×7): qty 5

## 2021-10-03 MED ORDER — PROPRANOLOL HCL 20 MG PO TABS
80.0000 mg | ORAL_TABLET | Freq: Once | ORAL | Status: AC
  Administered 2021-10-03: 80 mg via ORAL
  Filled 2021-10-03: qty 4

## 2021-10-03 MED ORDER — HYDROCODONE-ACETAMINOPHEN 5-325 MG PO TABS
1.0000 | ORAL_TABLET | Freq: Four times a day (QID) | ORAL | Status: DC | PRN
  Administered 2021-10-03 – 2021-10-05 (×3): 2 via ORAL
  Filled 2021-10-03 (×4): qty 2

## 2021-10-03 MED ORDER — MORPHINE SULFATE (PF) 4 MG/ML IV SOLN
4.0000 mg | Freq: Once | INTRAVENOUS | Status: AC
  Administered 2021-10-03: 4 mg via INTRAVENOUS
  Filled 2021-10-03: qty 1

## 2021-10-03 MED ORDER — PROPYLTHIOURACIL 50 MG PO TABS
600.0000 mg | ORAL_TABLET | Freq: Once | ORAL | Status: AC
  Administered 2021-10-03: 600 mg via ORAL
  Filled 2021-10-03: qty 12

## 2021-10-03 MED ORDER — ENOXAPARIN SODIUM 40 MG/0.4ML IJ SOSY
40.0000 mg | PREFILLED_SYRINGE | Freq: Every day | INTRAMUSCULAR | Status: DC
  Administered 2021-10-03 – 2021-10-04 (×2): 40 mg via SUBCUTANEOUS
  Filled 2021-10-03 (×2): qty 0.4

## 2021-10-03 MED ORDER — IOHEXOL 300 MG/ML  SOLN
100.0000 mL | Freq: Once | INTRAMUSCULAR | Status: AC | PRN
  Administered 2021-10-03: 100 mL via INTRAVENOUS

## 2021-10-03 MED ORDER — HYDROMORPHONE HCL 1 MG/ML IJ SOLN
1.0000 mg | INTRAMUSCULAR | Status: DC | PRN
  Administered 2021-10-03 (×3): 1 mg via INTRAVENOUS
  Filled 2021-10-03 (×3): qty 1

## 2021-10-03 MED ORDER — PROPRANOLOL HCL 1 MG/ML IV SOLN: 2.0000 mg | Freq: Once | INTRAVENOUS | Status: DC

## 2021-10-03 MED ORDER — INSULIN ASPART 100 UNIT/ML IJ SOLN
0.0000 [IU] | INTRAMUSCULAR | Status: DC
  Administered 2021-10-03: 2 [IU] via SUBCUTANEOUS
  Administered 2021-10-03: 8 [IU] via SUBCUTANEOUS
  Administered 2021-10-03: 2 [IU] via SUBCUTANEOUS
  Administered 2021-10-04: 15 [IU] via SUBCUTANEOUS
  Administered 2021-10-04: 11 [IU] via SUBCUTANEOUS
  Administered 2021-10-04: 2 [IU] via SUBCUTANEOUS
  Administered 2021-10-04: 8 [IU] via SUBCUTANEOUS
  Administered 2021-10-04 – 2021-10-05 (×3): 3 [IU] via SUBCUTANEOUS
  Administered 2021-10-05: 2 [IU] via SUBCUTANEOUS
  Filled 2021-10-03 (×11): qty 1

## 2021-10-03 MED ORDER — METOCLOPRAMIDE HCL 5 MG/ML IJ SOLN
10.0000 mg | Freq: Three times a day (TID) | INTRAMUSCULAR | Status: DC
  Administered 2021-10-03 – 2021-10-05 (×6): 10 mg via INTRAVENOUS
  Filled 2021-10-03 (×6): qty 2

## 2021-10-03 MED ORDER — POLYETHYLENE GLYCOL 3350 17 G PO PACK: 17.0000 g | PACK | Freq: Every day | ORAL | Status: DC | PRN

## 2021-10-03 NOTE — ED Notes (Signed)
Registration employee came out of room to alert staff of patient "seizing". RN walked into room and observed patient on right side shaking. When RN opened patient eyelids, patient looked at RN and stopped shaking.

## 2021-10-03 NOTE — ED Provider Notes (Signed)
[facility] Provider Note    Event Date/Time   First MD Initiated Contact with Patient 10/03/21 [phone]     (approximate)   History   Abdominal Pain and Seizures   HPI  Nancy [name] is a 21 y.o. female   Past medical history of seizures on Lamictal, type I diabetic, CHF, recent hospitalization discharged in August 2023 with diagnosed with hyperthyroidism presents with central abdominal pain and multiple seizures today.  Mother reports up to 10 seizures today, brief lasting several seconds at a time, patient reports commonly has about 1 seizure per day and has been compliant with all medications including her antiepileptic medications and thyroid medications.  Before today, was in her regular state of health with no recent illnesses or fever.  P.o. intake has been normal, bowel movements normal, and no dysuria.  He has occasional abdominal pain, chronic and intermittent, but this instance of central abdominal pain is worse than normal and lasting 2 days.  Nausea or vomiting.  She had a brief episode of seizure-like activity where she had tremors throughout her entire body and forced eye closure, lasting seconds witnessed by her nursing staff. When confronted by nursing during these episodes she immediately stops the tremors and is able to be coherent and response appropriately.   History was obtained via the patient and a review of external medical notes including discharge summary dated 08/23/2021      Physical Exam   Triage Vital Signs: ED Triage Vitals  Enc Vitals Group     BP 10/03/21 0342 (!) 214/115     Pulse Rate 10/03/21 0342 (!) 134     Resp 10/03/21 0342 (!) 24     Temp 10/03/21 0342 98.3 F (36.8 C)     Temp Source 10/03/21 0342 Oral     SpO2 10/03/21 0342 100 %     Weight 10/03/21 0341 176 lb 5.9 oz (80 kg)     Height 10/03/21 0341 5\' 2"  (1.575 m)     Head Circumference --      Peak Flow --      Pain Score 10/03/21 0343 10     Pain Loc  --      Pain Edu? --      Excl. in [place]? --     Most recent vital signs: Vitals:   10/03/21 0342 10/03/21 0534  BP: (!) 214/115 (!) 180/107  Pulse: (!) 134 (!) 130  Resp: (!) 24   Temp: 98.3 F (36.8 C)   SpO2: 100%     General: Awake, no distress.  Alert and oriented pleasant and cooperative. CV:  Good peripheral perfusion. Tachycardic and HTN. Resp:  Normal effort.  Lungs are clear to auscultation Abd:  No distention.  Epigastric tenderness without rigidity or guarding Other:  During all extremities, extraocular movements intact, no focal neurologic exam findings   ED Results / Procedures / Treatments   Labs (all labs ordered are listed, but only abnormal results are displayed) Labs Reviewed  COMPREHENSIVE METABOLIC PANEL - Abnormal; Notable for the following components:      Result Value   Potassium 3.4 (*)    Glucose, Bld 139 (*)    Creatinine, Ser <0.30 (*)    Calcium 10.7 (*)    Total Bilirubin 1.5 (*)    All other components within normal limits  BRAIN NATRIURETIC PEPTIDE - Abnormal; Notable for the following components:   B Natriuretic Peptide 959.9 (*)    All other components within  normal limits  CBC WITH DIFFERENTIAL/PLATELET - Abnormal; Notable for the following components:   Hemoglobin 9.9 (*)    HCT 31.3 (*)    MCV 75.2 (*)    MCH 23.8 (*)    All other components within normal limits  TSH - Abnormal; Notable for the following components:   TSH <0.010 (*)    All other components within normal limits  T4, FREE - Abnormal; Notable for the following components:   Free T4 >5.50 (*)    All other components within normal limits  LACTIC ACID, PLASMA  LACTIC ACID, PLASMA  URINALYSIS, ROUTINE W REFLEX MICROSCOPIC  LAMOTRIGINE LEVEL  PREGNANCY, URINE  CBG MONITORING, ED  TROPONIN I (HIGH SENSITIVITY)  TROPONIN I (HIGH SENSITIVITY)     I reviewed labs and they are notable for low TSH and markedly elevated free T4  EKG  ED ECG REPORT I, Pilar Jarvis,  the attending physician, personally viewed and interpreted this ECG.   Date: 10/03/2021  EKG Time: 0347   Rate: 135  Rhythm: sinus tachycardia  Axis: normal  Intervals:none  ST&T Change: early repol no ischemic changes      PROCEDURES:  Critical Care performed: Yes, see critical care procedure note(s)  .Critical Care  Performed by: Pilar Jarvis, MD Authorized by: Pilar Jarvis, MD   Critical care provider statement:    Critical care time (minutes):  30   Critical care was necessary to treat or prevent imminent or life-threatening deterioration of the following conditions:  Endocrine crisis   Critical care was time spent personally by me on the following activities:  Development of treatment plan with patient or surrogate, discussions with consultants, ordering and review of laboratory studies, pulse oximetry, re-evaluation of patient's condition, review of old charts, examination of patient and evaluation of patient's response to treatment    MEDICATIONS ORDERED IN ED: Medications  promethazine (PHENERGAN) 12.5 mg in sodium chloride 0.9 % 50 mL IVPB (0 mg Intravenous Stopped 10/03/21 0545)  Iodine Strong (Lugols) 5 % solution 0.2 mL (has no administration in time range)  lactated ringers bolus 500 mL (500 mLs Intravenous New Bag/Given 10/03/21 0500)  morphine (PF) 4 MG/ML injection 4 mg (4 mg Intravenous Given 10/03/21 0512)  propylthiouracil (PTU) tablet 600 mg (600 mg Oral Given 10/03/21 0613)  propranolol (INDERAL) tablet 80 mg (80 mg Oral Given 10/03/21 0534)  hydrocortisone sodium succinate (SOLU-CORTEF) 100 MG injection 300 mg (300 mg Intravenous Given 10/03/21 0610)  LORazepam (ATIVAN) injection 1 mg (1 mg Intravenous Given 10/03/21 0533)    Consultants:  I spoke with ICU regarding care plan for this patient.   IMPRESSION / MDM / ASSESSMENT AND PLAN / ED COURSE  I reviewed the triage vital signs and the nursing notes.                              Differential diagnosis  includes, but is not limited to, seizures, infection, intra-abdominal infection or obstruction, intracranial bleeding, hypothyroid, metabolic crisis, hypoglycemia or DKA   The patient is on the cardiac monitor to evaluate for evidence of arrhythmia and/or significant heart rate changes.  MDM: Patient with markedly elevated free T4 consistent with her thyroid, she states she has been taking all of her medications as prescribed and only missed 1 dose of methimazole which she ran out of today.  She is alert and oriented and afebrile but does show signs of adrenergic stimulation including hypertension and tachycardia.  I ordered PTU, steroids, propranolol, iodine and gentle fluid hydration given history of CHF.  Patient remains alert and oriented without true seizure activity, seizure activity reported from home is similar to those witnessed in the emergency department and described above in my note and lactic acid was normal.  Still pending CT head and abdomen and pelvis.  I consulted with ICU for admission for hypothyroid concern for thyroid storm   Patient's presentation is most consistent with acute presentation with potential threat to life or bodily function.       FINAL CLINICAL IMPRESSION(S) / ED DIAGNOSES   Final diagnoses:  None     Rx / DC Orders   ED Discharge Orders     None        Note:  This document was prepared using Dragon voice recognition software and may include unintentional dictation errors.    Pilar Jarvis, MD 10/03/21 [phone]

## 2021-10-03 NOTE — Consult Note (Signed)
[place] for Electrolyte Monitoring and Replacement   Recent Labs: Potassium (mmol/L)  Date Value  10/03/2021 3.4 (L)   Magnesium (mg/dL)  Date Value  10/03/2021 1.5 (L)   Calcium (mg/dL)  Date Value  10/03/2021 10.7 (H)   Albumin (g/dL)  Date Value  10/03/2021 3.8  10/05/2018 4.7   Phosphorus (mg/dL)  Date Value  10/03/2021 4.7 (H)   Sodium (mmol/L)  Date Value  10/03/2021 143  10/05/2018 139   Assessment: Patient is a 21 y/o F with medical history including seizures, T1DM, CHF, recent hospitalization in 08/2021 for hyperthyroidism who presented to the ED 9/28 with abdominal pain and seizure like activity. Patient subsequently admitted with thyroid storm. Pharmacy consulted to assist with electrolyte monitoring and replacement as indicated.  Goal of Therapy:  Electrolytes within normal limits  Plan:  --K 3.3, Kcl 10 mEq IV x 3 doses --Mg 1.5, magnesium sulfate 4 g IV x 1 --Follow-up electrolytes with AM labs tomorrow  Benita Gutter 10/03/2021 12:05 PM

## 2021-10-03 NOTE — Procedures (Signed)
Routine EEG Report  Nancy [name] is a 21 y.o. female with a history of spells of abnormal behavior who is undergoing an EEG to evaluate for seizures.  Report: This EEG was acquired with electrodes placed according to the International 10-20 electrode system (including Fp1, Fp2, F3, F4, C3, C4, P3, P4, O1, O2, T3, T4, T5, T6, A1, A2, Fz, Cz, Pz). The following electrodes were missing or displaced: none.  The occipital dominant rhythm was 9-10 Hz. This activity is reactive to stimulation. Drowsiness was manifested by background fragmentation; deeper stages of sleep were identified by K complexes and sleep spindles. There was no focal slowing. There were no interictal epileptiform discharges. There were no electrographic seizures identified. There was no abnormal response to photic stimulation or hyperventilation.   Impression: This EEG was obtained while awake and asleep and is normal.    Clinical Correlation: Normal EEGs, however, do not rule out epilepsy.  Su Monks, MD [facility] [phone]  If 7pm- 7am, please page neurology on call as listed in [place].

## 2021-10-03 NOTE — Progress Notes (Signed)
Eeg done 

## 2021-10-03 NOTE — ED Notes (Signed)
Pt up to restroom without assistance 

## 2021-10-03 NOTE — H&P (Signed)
NAME:  Nancy [name], MRN:  [id number], DOB:  Dec 10, 2000, LOS: 0 ADMISSION DATE:  10/03/2021, CONSULTATION DATE:  10/03/2021 REFERRING MD:  Lucillie Garfinkel  CHIEF COMPLAINT:  Seizures    HPI  21 y.o female  with significant PMH of seizures on Lamictal, type I diabetic, CHF, recent hospitalization discharged in August 2023 diagnosed with hyperthyroidism  who presented to the ED today with chief complaints of central abdominal pain and multiple seizures today.  ED Course: In the emergency department, vital signs showed an elevated blood pressure of 214/117mm Hg, tachycardia of  134 beats per minute, a respiratory rate of 24 breaths per minute, and a temperature of 98.3 F (36.8 C)    Pertinent Labs/Diagnostics Findings: Chemistry:Na+/ K+:  Glucose: BUN/Cr.:Calcium:  AST/ALT: CBC: WBC: Hgb/Hct: Plts:  Other Lab findings:   PCT: negative <0.10 Lactic acid:  COVID PCR: Negative, Troponin:  BNP:   Imaging: CTH> CTA Chest> CT Abd/pelvis> pending Medications received in the ED:PTU, steroids, propranolol, iodine and gentle fluid   Past Medical History   Acanthosis migricans    Anxiety    CHF (congestive heart failure) (HCC)    Chronic lower back pain    Depression    DKA, type 1 ([place]) 09/13/2018  Dyspepsia    Obesity    Ovarian cyst    pt is not aware of this hx (11/24/2017)  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    Procedures:  None  Significant Diagnostic Tests:  9/28: Noncontrast CT head> 9/28: CTA abdomen and pelvis> 9/28: CTA Chest>  Micro Data:  9/28: Blood culture x2> 9/28: Urine Culture> 9/28: MRSA PCR>>   Antimicrobials:  None  OBJECTIVE  Blood pressure (!) 180/107, pulse (!)  130, temperature 98.3 F (36.8 C), temperature source Oral, resp. rate (!) 24, height 5\' 2"  (1.575 m), weight 80 kg, SpO2 100 %.       No intake or output data in the 24 hours ending 10/03/21 0618 Filed Weights   10/03/21 0341  Weight: 80 kg     Physical Examination  GENERAL: 21 year-old critically ill patient lying in the bed with no acute distress.  EYES: Pupils equal, round, reactive to light and accommodation. No scleral icterus. Extraocular muscles intact.  HEENT: Head atraumatic, normocephalic. Oropharynx and nasopharynx clear.  NECK:  Supple, no jugular venous distention. No thyroid enlargement, no tenderness.  LUNGS: Normal breath sounds bilaterally, no wheezing, rales,rhonchi or crepitation. No use of accessory muscles of respiration.  CARDIOVASCULAR: S1, S2 normal. No murmurs, rubs, or gallops.  ABDOMEN: Soft, nontender, nondistended. Bowel sounds present. No organomegaly or mass.  EXTREMITIES: No pedal edema, cyanosis, or clubbing.  NEUROLOGIC: Cranial nerves II through XII are intact.  Muscle strength 5/5 in all extremities. Sensation intact. Gait not checked.  PSYCHIATRIC: The patient is alert and oriented x 3.  SKIN: No obvious rash, lesion, or ulcer.   Labs/imaging that I havepersonally reviewed  (right click and "Reselect all SmartList Selections" daily)     Labs   CBC: Recent Labs  Lab 10/03/21 0408  WBC 5.1  NEUTROABS 3.6  HGB 9.9*  HCT 31.3*  MCV  75.2*  PLT 0000000    Basic Metabolic Panel: Recent Labs  Lab 10/03/21 0408  NA 143  K 3.4*  CL 105  CO2 28  GLUCOSE 139*  BUN 12  CREATININE <0.30*  CALCIUM 10.7*   GFR: CrCl cannot be calculated (This lab value cannot be used to calculate CrCl because it is not a number: <0.30). Recent Labs  Lab 10/03/21 0408  WBC 5.1  LATICACIDVEN 1.5    Liver Function Tests: Recent Labs  Lab 10/03/21 0408  AST 33  ALT 24  ALKPHOS 81  BILITOT 1.5*  PROT 6.8  ALBUMIN 3.8   No results for input(s):  "LIPASE", "AMYLASE" in the last 168 hours. No results for input(s): "AMMONIA" in the last 168 hours.  ABG    Component Value Date/Time   PHART 7.402 12/01/2018 0120   PCO2ART 34.2 12/01/2018 0120   PO2ART 84.0 12/01/2018 0120   HCO3 28.4 (H) 06/28/2021 1117   TCO2 29 04/10/2020 1105   ACIDBASEDEF 4.4 (H) 08/06/2019 1335   O2SAT 91.4 06/28/2021 1117     Coagulation Profile: No results for input(s): "INR", "PROTIME" in the last 168 hours.  Cardiac Enzymes: No results for input(s): "CKTOTAL", "CKMB", "CKMBINDEX", "TROPONINI" in the last 168 hours.  HbA1C: Hgb A1c MFr Bld  Date/Time Value Ref Range Status  08/21/2021 09:32 AM 6.4 (H) 4.8 - 5.6 % Final    Comment:    (NOTE) Pre diabetes:          5.7%-6.4%  Diabetes:              >6.4%  Glycemic control for   <7.0% adults with diabetes   02/13/2021 03:28 AM 6.7 (H) 4.8 - 5.6 % Final    Comment:    (NOTE) Pre diabetes:          5.7%-6.4%  Diabetes:              >6.4%  Glycemic control for   <7.0% adults with diabetes     CBG: No results for input(s): "GLUCAP" in the last 168 hours.  Review of Systems:   Review of Systems  Constitutional:  Positive for diaphoresis.  HENT: Negative.    Eyes: Negative.   Respiratory:  Positive for shortness of breath.   Cardiovascular:  Positive for palpitations.  Gastrointestinal:  Positive for abdominal pain, nausea and vomiting.  Genitourinary: Negative.   Neurological: Negative.   Endo/Heme/Allergies: Negative.   Psychiatric/Behavioral:  Positive for depression. The patient is nervous/anxious.      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   Surgical History    Past Surgical History:   Procedure Laterality Date   ABDOMINAL HERNIA REPAIR     "I was a baby"   BIOPSY  10/12/2018   Procedure: BIOPSY;  Surgeon: Jackquline Denmark, MD;  Location[facility] ENDOSCOPY;  Service: Endoscopy;;   BIOPSY  02/28/2020   Procedure: BIOPSY;  Surgeon: Lavena Bullion, DO;  Location: [facility] ENDOSCOPY;  Service: Gastroenterology;;   ESOPHAGOGASTRODUODENOSCOPY (EGD) WITH PROPOFOL N/A 10/12/2018   Procedure: ESOPHAGOGASTRODUODENOSCOPY (EGD) WITH PROPOFOL;  Surgeon: Jackquline Denmark, MD;  Location: [facility] ENDOSCOPY;  Service: Endoscopy;  Laterality: N/A;   ESOPHAGOGASTRODUODENOSCOPY (EGD) WITH PROPOFOL N/A 02/28/2020   Procedure: ESOPHAGOGASTRODUODENOSCOPY (EGD) WITH PROPOFOL;  Surgeon: Lavena Bullion,  DO;  Location: [facility] [facility];  Service: Gastroenterology;  Laterality: N/A;   FLEXIBLE SIGMOIDOSCOPY N/A 02/28/2020   Procedure: FLEXIBLE SIGMOIDOSCOPY;  Surgeon: Lavena Bullion, DO;  Location: [place];  Service: Gastroenterology;  Laterality: N/A;   HERNIA REPAIR     LEFT HEART CATH AND CORONARY ANGIOGRAPHY N/A 10/13/2018   Procedure: LEFT HEART CATH AND CORONARY ANGIOGRAPHY;  Surgeon: Burnell Blanks, MD;  Location: [facility];  Service: Cardiovascular;  Laterality: N/A;   TONSILLECTOMY AND ADENOIDECTOMY     WISDOM TOOTH EXTRACTION  2017     Social History   reports that she has never smoked. She has never used smokeless tobacco. She reports that she does not drink alcohol and does not use drugs.   Family History   Her family history includes Allergic rhinitis in her mother; Asthma in her mother; Cervical cancer in her mother; Colon cancer in her maternal grandfather; Diabetes in her father, mother, paternal grandfather, and paternal grandmother; Eczema in her mother; Hyperlipidemia in her father; Hypertension in her father, maternal grandfather, mother, and paternal aunt; Obesity in her father, mother, paternal grandfather, and paternal grandmother. There is no history of Angioedema,  Immunodeficiency, Urticaria, Stomach cancer, or Esophageal cancer.   Allergies Allergies  Allergen Reactions   Oatmeal Anaphylaxis   Tomato Anaphylaxis   Ibuprofen Other (See Comments)    GI MD said to not take this anymore     Home Medications  Prior to Admission medications   Medication Sig Start Date End Date Taking? Authorizing Provider  ARIPiprazole (ABILIFY) 10 MG tablet Take 10 mg by mouth in the morning and at bedtime.  Patient not taking: Reported on 06/02/2021    [provider]  capsaicin (ZOSTRIX) 0.025 % cream Apply topically 2 (two) times daily. Patient not taking: Reported on 06/02/2021 02/29/20   Jonetta Osgood, MD  Continuous Blood Gluc Sensor (FREESTYLE LIBRE 2 SENSOR) MISC Use as directed 08/23/21   Wouk, Ailene Rud, MD  diclofenac Sodium (VOLTAREN) 1 % GEL Apply 1 application topically 2 (two) times daily as needed (chest pains). Patient not taking: Reported on 06/02/2021    [provider]  famotidine (PEPCID) 20 MG tablet Take 1 tablet (20 mg total) by mouth 2 (two) times daily. Patient not taking: Reported on 08/20/2021 07/12/21   Carrie Mew, MD  Glucagon [facility] TWO PACK) 3 MG/DOSE POWD Place 1 spray into the nose as needed. 08/23/21   Wouk, Ailene Rud, MD  insulin aspart (NOVOLOG FLEXPEN) 100 UNIT/ML FlexPen Inject 10 Units into the skin 2 (two) times daily before a meal. 08/23/21   Wouk, Ailene Rud, MD  insulin glargine (LANTUS) 100 unit/mL SOPN Inject 20 Units into the skin 2 (two) times daily. 08/23/21   Wouk, Ailene Rud, MD  loperamide (IMODIUM) 2 MG capsule Take 2 capsules (4 mg total) by mouth as needed for diarrhea or loose stools. 08/23/21   Wouk, Ailene Rud, MD  losartan (COZAAR) 25 MG tablet Take 1 tablet (25 mg total) by mouth daily. 08/23/21   Wouk, Ailene Rud, MD  methimazole (TAPAZOLE) 10 MG tablet Take 2 tablets (20 mg total) by mouth daily. 08/24/21   Wouk, Ailene Rud, MD  metoCLOPramide (REGLAN) 10 MG tablet Take 1  tablet (10 mg total) by mouth 3 (three) times daily with meals. 05/07/21 05/07/22  Hinda Kehr, MD  Multiple Vitamin (MULTIVITAMIN WITH MINERALS) TABS tablet Take 1 tablet by mouth daily. Patient not taking: Reported on 06/02/2021 09/18/18   Clapacs, Madie Reno,  MD  ondansetron (ZOFRAN-ODT) 4 MG disintegrating tablet Take 1 tablet (4 mg total) by mouth every 8 (eight) hours as needed. 07/16/21   Vladimir Crofts, MD  promethazine (PHENERGAN) 25 MG suppository Place 1 suppository (25 mg total) rectally every 8 (eight) hours as needed for nausea or vomiting. 06/30/21   Nance Pear, MD  propranolol (INDERAL) 80 MG tablet Take 1 tablet (80 mg total) by mouth 3 (three) times daily. 08/23/21   Wouk, Ailene Rud, MD  QUEtiapine (SEROQUEL) 100 MG tablet Take 300 mg by mouth at bedtime. Patient not taking: Reported on 08/20/2021 07/05/19   [provider]  prochlorperazine (COMPAZINE) 25 MG suppository PLACE 1 SUPPOSITORY (25 MG TOTAL) RECTALLY EVERY TWELVE HOURS AS NEEDED FOR NAUSEA OR VOMITING. 02/29/20 02/29/20  Ghimire, Henreitta Leber, MD  Scheduled Meds:  Iodine Strong (Lugols)  0.2 mL Oral TID   Continuous Infusions:  promethazine (PHENERGAN) injection (IM or IVPB) Stopped (10/03/21 0545)   PRN Meds:.docusate sodium, polyethylene glycol, promethazine (PHENERGAN) injection (IM or IVPB)   Active Hospital Problem list   Thyroid Storm Congestive Heart Failure Chronic Anemia Acute Metabolic Encephalopathy  Diabetes Mellitus  Assessment & Plan:  Thyroid Storm Patient with hx of hyperthyroidism, Graves presenting with diaphoresis, palpitation, n/v, seizures, hypertension  TSH: <0.0, Free T4: >5.50 -CT abdomen/Pelvis and Chest xray with no obvious source of infection -Blood cultures & infectious workup as indicated -Hold abx for now have low suspicion for infectious process -Start PTU 500mg  x 1 dose then 250mg  every 4 hours, will give Lugol's Saturated solution of KI (SSKI), 5 drops (0.25 ml) PO  q6hr. -Hydrocortisone 300 mg IV x 1 dose in the ED. Continue maintenance dose of 100 mg IV Q8hr. -Propanolol 60 mg Q4hr -Due to hx of heart failure, may require permissive tachycardia to achieve adequate perfusion with targeting a heart rate below ~782 b/m  -Avoid salicylates or NSAIDs, may increase free thyroid hormone levels. -Gentle hydration with  for MAP goal>65  Seizure Like Activity Hx of Seizures on Lamictal -Assess EEG  -Initial CT head negative for acute intracranial abnormality -will continue her home Lamictal -Seizure precautions -Neuro consult as appropriate  Acute on chronic HFpEF (recent Echo with EF of 08/2021 60 to 65%) BNP~slightly elevated at although does not appear to be volume overloaded -Continuous cardiac monitoring -Maintain MAP greater than 65 -IV Lasix as blood pressure and renal function permits; currently on Lasix 40 mg IV BID -Continue Losartan   Acute Metabolic Encephalopathy ~ -Provide supportive care -Promote normal sleep/wake cycle -Avoid sedating meds as able -CT Head  negative for acute intracranial abnormality   Diabetes mellitus Type 1 HgbA1c  -CBG's Q2-4Hr; Target range of 140 to 180 -SSI -Follow ICU Hypo/Hyperglycemia protocol   Best practice:  Diet:  NPO Pain/Anxiety/Delirium protocol (if indicated): No VAP protocol (if indicated): Not indicated DVT prophylaxis: Contraindicated GI prophylaxis: PPI Glucose control:  SSI Yes Central venous access:  N/A Arterial line:  N/A Foley:  N/A Mobility:  bed rest  PT consulted: N/A Last date of multidisciplinary goals of care discussion [9/28] Code Status:  full code Disposition: ICU   = Goals of Care = Code Status Order: FULL  Primary Emergency Contact: Route,[place], Home Phone: [phone] Wishes to pursue full aggressive treatment and intervention options, including CPR and intubation, but goals of care will be addressed on going with family if that should become  necessary.  Critical care time: 45 minutes       Rufina Falco, DNP, CCRN, FNP-C, AGACNP-BC  Acute Care Nurse Practitioner [facility]  PCCM on call pager [phone] until 7 am

## 2021-10-03 NOTE — ED Notes (Signed)
Pt had full body shaking, no tonic-clonic movements, pt had eyes closed tightly, with head turned away, when pt stopped shaking she opened eyes wide, did not appear to be post-ictal. Dr. Jacelyn Grip notified.

## 2021-10-03 NOTE — ED Triage Notes (Addendum)
Pt arrived via [facility] from home with reports of seizure-like activity, pt reports multiple seizures today and 1 was witnessed with EMS that lasted about 1 min which they describe as tremors.  Mother reported to EMS that pt had a seizure that lasted 3 minutes today.  Per EMS pt was alert to surroundings after witnessed seizure activity and did not appear to be post-ictal.  Abdominal tenderness noted on assessment

## 2021-10-04 DIAGNOSIS — E0590 Thyrotoxicosis, unspecified without thyrotoxic crisis or storm: Secondary | ICD-10-CM | POA: Diagnosis not present

## 2021-10-04 LAB — CBC
HCT: 29.1 % — ABNORMAL LOW (ref 36.0–46.0)
Hemoglobin: 9.3 g/dL — ABNORMAL LOW (ref 12.0–15.0)
MCH: 24 pg — ABNORMAL LOW (ref 26.0–34.0)
MCHC: 32 g/dL (ref 30.0–36.0)
MCV: 75 fL — ABNORMAL LOW (ref 80.0–100.0)
Platelets: 152 10*3/uL (ref 150–400)
RBC: 3.88 MIL/uL (ref 3.87–5.11)
RDW: 14.7 % (ref 11.5–15.5)
WBC: 9.9 10*3/uL (ref 4.0–10.5)
nRBC: 0 % (ref 0.0–0.2)

## 2021-10-04 LAB — BASIC METABOLIC PANEL
Anion gap: 10 (ref 5–15)
BUN: 19 mg/dL (ref 6–20)
CO2: 26 mmol/L (ref 22–32)
Calcium: 10.2 mg/dL (ref 8.9–10.3)
Chloride: 103 mmol/L (ref 98–111)
Creatinine, Ser: 0.43 mg/dL — ABNORMAL LOW (ref 0.44–1.00)
GFR, Estimated: >60 mL/min (ref >60)
Glucose, Bld: 119 mg/dL — ABNORMAL HIGH (ref 70–99)
Potassium: 3.5 mmol/L (ref 3.5–5.1)
Sodium: 139 mmol/L (ref 135–145)

## 2021-10-04 LAB — GLUCOSE, CAPILLARY
Glucose-Capillary: 310 mg/dL — ABNORMAL HIGH (ref 70–99)
Glucose-Capillary: 197 mg/dL — ABNORMAL HIGH (ref 70–99)
Glucose-Capillary: 371 mg/dL — ABNORMAL HIGH (ref 70–99)
Glucose-Capillary: 142 mg/dL — ABNORMAL HIGH (ref 70–99)
Glucose-Capillary: 264 mg/dL — ABNORMAL HIGH (ref 70–99)
Glucose-Capillary: 164 mg/dL — ABNORMAL HIGH (ref 70–99)

## 2021-10-04 LAB — LAMOTRIGINE LEVEL: Lamotrigine Lvl: <1 ug/mL — ABNORMAL LOW (ref 2.0–20.0)

## 2021-10-04 LAB — MAGNESIUM: Magnesium: 2 mg/dL (ref 1.7–2.4)

## 2021-10-04 LAB — PROLACTIN: Prolactin: 30.3 ng/mL — ABNORMAL HIGH (ref 4.8–23.3)

## 2021-10-04 MED ORDER — PROPRANOLOL HCL 20 MG PO TABS
80.0000 mg | ORAL_TABLET | Freq: Three times a day (TID) | ORAL | Status: DC
  Administered 2021-10-04 – 2021-10-05 (×2): 80 mg via ORAL
  Filled 2021-10-04 (×2): qty 4

## 2021-10-04 MED ORDER — INSULIN GLARGINE-YFGN 100 UNIT/ML ~~LOC~~ SOLN
15.0000 [IU] | Freq: Two times a day (BID) | SUBCUTANEOUS | Status: DC
  Administered 2021-10-04 – 2021-10-05 (×3): 15 [IU] via SUBCUTANEOUS
  Filled 2021-10-04 (×3): qty 0.15

## 2021-10-04 MED ORDER — LAMOTRIGINE 25 MG PO TABS
100.0000 mg | ORAL_TABLET | Freq: Three times a day (TID) | ORAL | Status: DC
  Administered 2021-10-04 – 2021-10-05 (×2): 100 mg via ORAL
  Filled 2021-10-04 (×2): qty 4

## 2021-10-04 MED ORDER — POTASSIUM CHLORIDE CRYS ER 20 MEQ PO TBCR
40.0000 meq | EXTENDED_RELEASE_TABLET | Freq: Once | ORAL | Status: AC
  Administered 2021-10-04: 40 meq via ORAL
  Filled 2021-10-04: qty 2

## 2021-10-04 MED ORDER — CHLORHEXIDINE GLUCONATE CLOTH 2 % EX PADS: 6.0000 | MEDICATED_PAD | Freq: Every day | CUTANEOUS | Status: DC

## 2021-10-04 MED ORDER — HYDROCORTISONE SOD SUC (PF) 100 MG IJ SOLR
100.0000 mg | Freq: Two times a day (BID) | INTRAMUSCULAR | Status: DC
  Filled 2021-10-04: qty 2

## 2021-10-04 NOTE — Progress Notes (Addendum)
  PROGRESS NOTE    Nancy [name]  IRW:[id number] DOB: 09/06/2000 DOA: 10/03/2021 PCP: Nolene Ebbs, MD  160A/160A-AA  LOS: 1 day   Brief hospital course:   Assessment & Plan: 21 y.o female  with significant PMH of seizures on Lamictal, type I diabetic, CHF, recent hospitalization discharged in August 2023 diagnosed with hyperthyroidism  who presented to the ED today with chief complaints of central abdominal pain and multiple seizures today.    Pt was found to be severely hyperthyroid.  She was given 10mg  propranolol PO in the ER along with 400mg  PTU and 300mg  IV hydrocortisone.  Pt was admitted by ICU for concern of thyroid storm.    Thyroid Storm, ruled out  Severe hyperthyroidism  --pt was hospitalized in Aug 2023 for similar and was discharged on methimazole 20 mg daily.  Pt said she was compliant.   --presented elevated BP and tachycardia, but no fever.  current TSH: <0.01, Free T4: >5.50, similar to a month ago. -CT abdomen/Pelvis and Chest xray with no obvious source of infection.  abx was not started. -Start PTU Lugol's Saturated solution and steroid on admission --discussed with endocrine Dr. Gabriel Carina about management.  It may take a few months for thyroid hormone to normalize on methimazole.   Plan: --d/c Lugol's soln and steroid --cont PTU for now --resume home propanolol --outpatient f/u with Dr. Gabriel Carina   Seizure Like Activity Hx of Seizures on Lamictal -Initial CT head negative for acute intracranial abnormality --pt reported taking Lamictal at home but unsure of the dose, and it's not on home med list. --order as Lamictal 100 mg BID for now.   Acute on chronic HFpEF  --recent Echo with EF of 08/2021 60 to 65% --stable   Acute Metabolic Encephalopathy, ruled out  Diabetes mellitus Type 1 --resume long-acting as glargine 15u BID --SSI   DVT prophylaxis: Lovenox SQ Code Status: Full code  Family Communication:  Level of care: Med-Surg Dispo:   The patient  is from: home Anticipated d/c is to: home Anticipated d/c date is: tomorrow   Subjective and Interval History:  Pt reported still having some mild abdominal discomfort, but that's her baseline.  Per RN, pt ate a large amount of food.    Discussed with endocrin Dr. Gabriel Carina over the phone about this pt's management.   Objective: Vitals:   10/04/21 1500 10/04/21 1600 10/04/21 1700 10/04/21 1722  BP: (!) 129/57 92/75 (!) 118/57 (!) 125/56  Pulse: (!) 110 (!) 112 (!) 110 (!) 120  Resp: (!) 27 18  16   Temp:  98.6 F (37 C)  98.1 F (36.7 C)  TempSrc:  Oral    SpO2: 99% 99% 100% 100%  Weight:      Height:        Intake/Output Summary (Last 24 hours) at 10/04/2021 1753 Last data filed at 10/04/2021 1415 Gross per 24 hour  Intake 230 ml  Output 75 ml  Net 155 ml   Filed Weights   10/03/21 0341 10/04/21 0500  Weight: 80 kg 64.8 kg    Examination:   Constitutional: NAD, AAOx3 HEENT: conjunctivae and lids normal, EOMI CV: No cyanosis.   RESP: normal respiratory effort, on RA Neuro: II - XII grossly intact.   Psych: Normal mood and affect.  Appropriate judgement and reason   Data Reviewed: I have personally reviewed labs and imaging studies  Time spent: 50 minutes  Enzo Bi, MD [facility] If 7PM-7AM, please contact night-coverage 10/04/2021, 5:53 PM

## 2021-10-04 NOTE — Progress Notes (Signed)
Patient transferred to room 160 via wheel chair with cardiac monitoring, in stable condition.

## 2021-10-04 NOTE — Inpatient Diabetes Management (Addendum)
Inpatient Diabetes Program Recommendations  AACE/ADA: New Consensus Statement on Inpatient Glycemic Control (2015)  Target Ranges:  Prepandial:   less than 140 mg/dL      Peak postprandial:   less than 180 mg/dL (1-2 hours)      Critically ill patients:  140 - 180 mg/dL    Latest Reference Range & Units 10/03/21 12:42 10/03/21 16:08 10/03/21 19:21 10/03/21 23:24 10/04/21 03:38 10/04/21 07:40  Glucose-Capillary 70 - 99 mg/dL 178 (H) 143 (H)  2 units Novolog  132 (H)  2 units Novolog  255 (H)  8 units Novolog  310 (H)  11 units Novolog  197 (H)  (H): Data is abnormally high    Admit with:  Thyroid Storm Patient with hx of hyperthyroidism, Graves presenting with diaphoresis, palpitation, n/v, seizures, hypertension  History: DM1 (requires basal, meal coverage, and correction), CHF  Home DM Meds: Lantus 60 units BID       Novolog (NOT taking)       Freestyle Libre 2 CGM  Current Orders: Novolog Moderate Correction Scale/ SSI (0-15 units) Q4 hours   Endocrinologist: Dr. Zollie Scale w/ [facility]?? PCP: Dr. Jeanie Cooks  Diabetes Coordinator spoke with pt 08/22/2021 & 08/23/2021  Has she made appt with ENDO yet?   MD- Note pt getting Solucortef 100 mg Q8 hours  Pt takes Basal Insulin at home  Please consider starting weight-based Semglee insulin this AM: Semglee 20 units Daily (0.3 units/kg)    Addendum 10:30am--Met w/ pt at bedside this AM.  Pt A&O and able to answer all my questions.  Reviewed last A1c of 6.4% with pt, however, pt told me she has about 3 episodes of treatable Hypoglycemia at home per week.  Was given Rx for Baqsimi nasal last admission but has not had to use it yet at home.  Was given sample of the Freestyle Libre 2 CGM last admission, but was unable to get refills for the sensors as her Medicaid does not cover the CGM--Has traditional fingerstick CBG meter at home.    Reviewed home insulin regimen with pt.  She stated she is taking Lantus 15 units BID +  Novolog 70 units QHS--I asked her to clarify this and she repeated the same info, however, this dose of Novolog (70 units QHS) seems like a dangerous dose and is likely not correct.  Past admissions have shown pt was taking Novolog 15 units TID with meals.  Pt told me she called [facility] to try to get an appt with this practice (hasn't seen ENDO since childhood), however, [facility] was not taking new pts.  Pt told me she plans to contact [facility] next.  We reviewed s/sxs of Hypoglycemia and pt comfortable treating lows when she has them, but she did state she sometimes has lows where she is unable to treat herself and her Mom has to help her--States she and her Mom know how to use the Baqsimi nasal Rx for lows.  Discussed with pt that we are currently giving her Novolog Q4 hours on a correction scale and that I have asked the MD to add Basal insulin to her in-hospital regimen.  Pt appreciative of visit and did not have any questions for me at this time.   --Will follow patient during hospitalization--  Wyn Quaker RN, MSN, [place] Diabetes Coordinator Inpatient Glycemic Control Team Team Pager: [phone] (8a-5p)

## 2021-10-04 NOTE — Plan of Care (Signed)

## 2021-10-04 NOTE — Plan of Care (Signed)
Continuing with plan of care. 

## 2021-10-04 NOTE — Consult Note (Signed)
[facility] for Electrolyte Monitoring and Replacement   Recent Labs: Potassium (mmol/L)  Date Value  10/04/2021 3.5   Magnesium (mg/dL)  Date Value  10/04/2021 2.0   Calcium (mg/dL)  Date Value  10/04/2021 10.2   Albumin (g/dL)  Date Value  10/03/2021 3.8  10/05/2018 4.7   Phosphorus (mg/dL)  Date Value  10/03/2021 4.7 (H)   Sodium (mmol/L)  Date Value  10/04/2021 139  10/05/2018 139   Assessment: Patient is a 21 y/o F with medical history including seizures, T1DM, CHF, recent hospitalization in 08/2021 for hyperthyroidism who presented to the ED 9/28 with abdominal pain and seizure like activity. Patient subsequently admitted with thyroid storm. Pharmacy consulted to assist with electrolyte monitoring and replacement as indicated.  Goal of Therapy:  Electrolytes within normal limits  Plan:  --K 3.5, will give Kcl 40 mEq PO x 1 dose --Patient care transferred from PCCM to [facility]. Will discontinue electrolyte consult at this time. Defer further ordering of labs and electrolyte replacement to primary team --Pharmacy will continue to follow along peripherally  Benita Gutter 10/04/2021 8:00 AM

## 2021-10-05 LAB — BASIC METABOLIC PANEL
Anion gap: 5 (ref 5–15)
BUN: 24 mg/dL — ABNORMAL HIGH (ref 6–20)
CO2: 27 mmol/L (ref 22–32)
Calcium: 9.3 mg/dL (ref 8.9–10.3)
Chloride: 108 mmol/L (ref 98–111)
Creatinine, Ser: 0.39 mg/dL — ABNORMAL LOW (ref 0.44–1.00)
GFR, Estimated: >60 mL/min (ref >60)
Glucose, Bld: 128 mg/dL — ABNORMAL HIGH (ref 70–99)
Potassium: 3.9 mmol/L (ref 3.5–5.1)
Sodium: 140 mmol/L (ref 135–145)

## 2021-10-05 LAB — GLUCOSE, CAPILLARY
Glucose-Capillary: 187 mg/dL — ABNORMAL HIGH (ref 70–99)
Glucose-Capillary: 132 mg/dL — ABNORMAL HIGH (ref 70–99)

## 2021-10-05 LAB — CBC
HCT: 26.7 % — ABNORMAL LOW (ref 36.0–46.0)
Hemoglobin: 8.4 g/dL — ABNORMAL LOW (ref 12.0–15.0)
MCH: 23.4 pg — ABNORMAL LOW (ref 26.0–34.0)
MCHC: 31.5 g/dL (ref 30.0–36.0)
MCV: 74.4 fL — ABNORMAL LOW (ref 80.0–100.0)
Platelets: 155 10*3/uL (ref 150–400)
RBC: 3.59 MIL/uL — ABNORMAL LOW (ref 3.87–5.11)
RDW: 14.5 % (ref 11.5–15.5)
WBC: 7.2 10*3/uL (ref 4.0–10.5)
nRBC: 0 % (ref 0.0–0.2)

## 2021-10-05 LAB — MAGNESIUM: Magnesium: 1.9 mg/dL (ref 1.7–2.4)

## 2021-10-05 MED ORDER — PROPRANOLOL HCL 80 MG PO TABS: 80.0000 mg | ORAL_TABLET | Freq: Three times a day (TID) | ORAL | 2 refills | Status: DC

## 2021-10-05 MED ORDER — METHIMAZOLE 10 MG PO TABS: 20.0000 mg | ORAL_TABLET | Freq: Two times a day (BID) | ORAL | 2 refills | Status: DC

## 2021-10-05 MED ORDER — LAMOTRIGINE 100 MG PO TABS: 100.0000 mg | ORAL_TABLET | Freq: Three times a day (TID) | ORAL | 2 refills | Status: DC

## 2021-10-05 MED ORDER — LOSARTAN POTASSIUM 25 MG PO TABS: 25.0000 mg | ORAL_TABLET | Freq: Every day | ORAL | Status: DC

## 2021-10-05 NOTE — Discharge Summary (Signed)
Physician Discharge Summary   Nancy [name]  female DOB: 2000/07/22  JHE:[id number]  PCP: Fleet Contras, MD  Admit date: 10/03/2021 Discharge date: 10/05/2021  Admitted From: home Disposition:  home CODE STATUS: Full code  Discharge Instructions     Ambulatory referral to Endocrinology   Complete by: As directed    Diet Carb Modified   Complete by: As directed       Hospital Course:  For full details, please see H&P, progress notes, consult notes and ancillary notes.  Briefly,  21 y.o female  with significant PMH of seizures on Lamictal, type I diabetic, CHF, recent hospitalization discharged in August 2023 diagnosed with hyperthyroidism  who presented to the ED today with chief complaints of central abdominal pain and multiple seizures.     Pt was found to be severely hyperthyroid.  She was given 10mg  propranolol PO in the ER along with 400mg  PTU and 300mg  IV hydrocortisone.  Pt was admitted by ICU for concern of thyroid storm.    Thyroid Storm, ruled out   Severe hyperthyroidism  --pt was hospitalized in Aug 2023 for similar and was discharged on methimazole 20 mg daily.  Pt said she was compliant.   --presented elevated BP and tachycardia, but no fever.  current TSH: <0.01, Free T4: >5.50, similar to a month ago. -CT abdomen/Pelvis and Chest xray with no obvious source of infection.  abx was not started. -Started on PTU Lugol's Saturated solution and steroid on admission, which were not continued. --discussed with endocrine Dr. about management.  It may take a few months for thyroid hormone to normalize on methimazole.   --discharged on methimazole 20 mg BID --resume home propanolol --outpatient f/u with Dr.   Seizure Like Activity Hx of Seizures on Lamictal -Initial CT head negative for acute intracranial abnormality --discussed with oncall neuro, who agreed the seizure-like activities described do not sound like true seizures. --cont home Lamictal  100 mg TID    Acute on chronic HFpEF  --recent Echo with EF of 08/2021 60 to 65% --stable   Acute Metabolic Encephalopathy, ruled out   Diabetes mellitus Type 1 --discharged on home regimen as listed below in med list.  Unless noted above, medications under "STOP" list are ones pt was not taking PTA.  Discharge Diagnoses:  Active Problems:   * No active hospital problems. *   30 Day Unplanned Readmission Risk Score    Flowsheet Row ED to Hosp-Admission (Current) from 10/03/2021 in [facility] (1A)  30 Day Unplanned Readmission Risk Score (%) 60.82 Filed at 10/05/2021 0800       This score is the patient's risk of an unplanned readmission within 30 days of being discharged (0 -100%). The score is based on dignosis, age, lab data, medications, orders, and past utilization.   Low:  0-14.9   Medium: 15-21.9   High: 22-29.9   Extreme: 30 and above         Discharge Instructions:  Allergies as of 10/05/2021       Reactions   Oatmeal Anaphylaxis   Tomato Anaphylaxis   Ibuprofen Other (See Comments)   GI MD said to not take this anymore        Medication List     STOP taking these medications    ARIPiprazole 10 MG tablet Commonly known as: ABILIFY   capsaicin 0.025 % cream Commonly known as: ZOSTRIX   diclofenac Sodium 1 % Gel Commonly known as: VOLTAREN  famotidine 20 MG tablet Commonly known as: PEPCID   multivitamin with minerals Tabs tablet   NovoLOG FlexPen 100 UNIT/ML FlexPen Generic drug: insulin aspart   QUEtiapine 100 MG tablet Commonly known as: SEROQUEL       TAKE these medications    Baqsimi Two Pack 3 MG/DOSE Powd Generic drug: Glucagon Place 1 spray into the nose as needed.   FreeStyle Libre 2 Sensor Misc Use as directed   furosemide 20 MG tablet Commonly known as: LASIX Take 20 mg by mouth daily as needed.   insulin glargine 100 unit/mL Sopn Commonly known as: LANTUS Inject 20 Units into the  skin 2 (two) times daily. What changed: how much to take   lamoTRIgine 100 MG tablet Commonly known as: LAMICTAL Take 1 tablet (100 mg total) by mouth in the morning, at noon, and at bedtime.   loperamide 2 MG capsule Commonly known as: IMODIUM Take 2 capsules (4 mg total) by mouth as needed for diarrhea or loose stools.   losartan 25 MG tablet Commonly known as: COZAAR Take 1 tablet (25 mg total) by mouth daily.   methimazole 10 MG tablet Commonly known as: TAPAZOLE Take 2 tablets (20 mg total) by mouth 2 (two) times daily.   metoCLOPramide 10 MG tablet Commonly known as: REGLAN Take 1 tablet (10 mg total) by mouth 3 (three) times daily with meals.   ondansetron 4 MG disintegrating tablet Commonly known as: ZOFRAN-ODT Take 1 tablet (4 mg total) by mouth every 8 (eight) hours as needed.   promethazine 25 MG suppository Commonly known as: PHENERGAN Place 1 suppository (25 mg total) rectally every 8 (eight) hours as needed for nausea or vomiting.   propranolol 80 MG tablet Commonly known as: INDERAL Take 1 tablet (80 mg total) by mouth 3 (three) times daily.         Follow-up Information     Solum, Marlana Salvage, MD Follow up.   Specialty: Endocrinology Contact information: [phone] [facility] [address] [facility] [place] [address] [phone]         Fleet Contras, MD Follow up in 1 week(s).   Specialty: Internal Medicine Contact information: [address] [place] [address] [phone]         Pricilla Riffle, MD .   Specialty: Cardiology Contact information: [address] [place] [address] [phone]                 Allergies  Allergen Reactions   Oatmeal Anaphylaxis   Tomato Anaphylaxis   Ibuprofen Other (See Comments)    GI MD said to not take this anymore     The results of significant diagnostics from this hospitalization (including imaging, microbiology, ancillary and laboratory) are listed  below for reference.   Consultations:   Procedures/Studies: EEG adult  Result Date: 11/01/2021 Jefferson Fuel, MD     Nov 01, 2021  3:43 PM Routine EEG Report CHANAE SYBESMA is a 21 y.o. female with a history of spells of abnormal behavior who is undergoing an EEG to evaluate for seizures. Report: This EEG was acquired with electrodes placed according to the International 10-20 electrode system (including Fp1, Fp2, F3, F4, C3, C4, P3, P4, O1, O2, T3, T4, T5, T6, A1, A2, Fz, Cz, Pz). The following electrodes were missing or displaced: none. The occipital dominant rhythm was 9-10 Hz. This activity is reactive to stimulation. Drowsiness was manifested by background fragmentation; deeper stages of sleep were identified by K complexes and  sleep spindles. There was no focal slowing. There were no interictal epileptiform discharges. There were no electrographic seizures identified. There was no abnormal response to photic stimulation or hyperventilation. Impression: This EEG was obtained while awake and asleep and is normal.   Clinical Correlation: Normal EEGs, however, do not rule out epilepsy. Bing Neighbors, MD [facility] [phone] If 7pm- 7am, please page neurology on call as listed in AMION.   DG Abd Portable 1V  Result Date: 10/03/2021 CLINICAL DATA:  Provided history: NG tube verification. EXAM: PORTABLE ABDOMEN - 1 VIEW COMPARISON:  CT abdomen/pelvis 10/03/2021. FINDINGS: An enteric tube passes below level of left hemidiaphragm with tip terminating the expected location of the gastric fundus. Nonspecific mildly air distended loop of small bowel within the left hemiabdomen. No dilated loops of small bowel at the imaged levels. IMPRESSION: Enteric tube present with tip terminating in the expected location of the gastric fundus. Electronically Signed   By: Jackey Loge D.O.   On: 10/03/2021 13:23   CT Abdomen Pelvis W Contrast  Result Date: 10/03/2021 CLINICAL DATA:  Abdominal pain,  seizure-like activity EXAM: CT ABDOMEN AND PELVIS WITH CONTRAST TECHNIQUE: Multidetector CT imaging of the abdomen and pelvis was performed using the standard protocol following bolus administration of intravenous contrast. RADIATION DOSE REDUCTION: This exam was performed according to the departmental dose-optimization program which includes automated exposure control, adjustment of the mA and/or kV according to patient size and/or use of iterative reconstruction technique. CONTRAST:  OMNIPAQUE IOHEXOL 300 MG/ML  SOLN COMPARISON:  CT abdomen and pelvis dated July 16, 2021 FINDINGS: Lower chest: No acute abnormality. Hepatobiliary: No focal liver abnormality is seen. No gallstones, gallbladder wall thickening, or biliary dilatation. Pancreas: Unremarkable. No pancreatic ductal dilatation or surrounding inflammatory changes. Spleen: Normal in size without focal abnormality. Adrenals/Urinary Tract: Stable left adrenal gland nodule is consistent with adenoma, no specific follow-up imaging is recommended. Kidneys are normal, without renal calculi, focal lesion, or hydronephrosis. Bladder is unremarkable. Stomach/Bowel: Stomach is within normal limits. Appendix appears normal. No evidence of bowel wall thickening, distention, or inflammatory changes. Vascular/Lymphatic: No significant vascular findings are present. No enlarged abdominal or pelvic lymph nodes. Reproductive: Uterus and bilateral adnexa are unremarkable. Other: No abdominal wall hernia or abnormality. No abdominopelvic ascites. Musculoskeletal: No acute or significant osseous findings. IMPRESSION: No acute findings in the abdomen or pelvis. Electronically Signed   By: Allegra  M.D.   On: 10/03/2021 08:19   CT Head Wo Contrast  Result Date: 10/03/2021 CLINICAL DATA:  Seizures. EXAM: CT HEAD WITHOUT CONTRAST TECHNIQUE: Contiguous axial images were obtained from the base of the skull through the vertex without intravenous contrast. RADIATION  DOSE REDUCTION: This exam was performed according to the departmental dose-optimization program which includes automated exposure control, adjustment of the mA and/or kV according to patient size and/or use of iterative reconstruction technique. COMPARISON:  08/06/2021 FINDINGS: Brain: The ventricles are normal in size and configuration. No extra-axial fluid collections are identified. The gray-white differentiation is maintained. No CT findings for acute hemispheric infarction or intracranial hemorrhage. No mass lesions. The brainstem and cerebellum are normal. Vascular: No hyperdense vessels or obvious aneurysm. Skull: No acute skull fracture.  No bone lesion. Sinuses/Orbits: The paranasal sinuses and mastoid air cells are clear. The globes are intact. Other: No scalp lesions, laceration or hematoma. IMPRESSION: Normal head CT. Electronically Signed   By: Rudie Meyer M.D.   On: 10/03/2021 08:15   DG Chest Port 1 View  Result Date: 10/03/2021  CLINICAL DATA:  21 year old female with history of seizure-like activity. EXAM: PORTABLE CHEST 1 VIEW COMPARISON:  Chest x-ray 08/21/2021. FINDINGS: Lung volumes are normal. No consolidative airspace disease. No pleural effusions. No pneumothorax. No pulmonary nodule or mass noted. Pulmonary vasculature and the cardiomediastinal silhouette are within normal limits. IMPRESSION: No radiographic evidence of acute cardiopulmonary disease. Electronically Signed   By: Vinnie Langton M.D.   On: 10/03/2021 05:12      Labs: BNP (last 3 results) Recent Labs    08/21/21 0104 10/03/21 0408  BNP 425.9* 161.0*   Basic Metabolic Panel: Recent Labs  Lab 10/03/21 0408 10/04/21 0615 10/05/21 0458  NA 143 139 140  K 3.4* 3.5 3.9  CL 105 103 108  CO2 28 26 27   GLUCOSE 139* 119* 128*  BUN 12 19 24*  CREATININE <0.30* 0.43* 0.39*  CALCIUM 10.7* 10.2 9.3  MG 1.5* 2.0 1.9  PHOS 4.7*  --   --    Liver Function Tests: Recent Labs  Lab 10/03/21 0408  AST 33  ALT  24  ALKPHOS 81  BILITOT 1.5*  PROT 6.8  ALBUMIN 3.8   No results for input(s): "LIPASE", "AMYLASE" in the last 168 hours. No results for input(s): "AMMONIA" in the last 168 hours. CBC: Recent Labs  Lab 10/03/21 0408 10/04/21 0615 10/05/21 0458  WBC 5.1 9.9 7.2  NEUTROABS 3.6  --   --   HGB 9.9* 9.3* 8.4*  HCT 31.3* 29.1* 26.7*  MCV 75.2* 75.0* 74.4*  PLT 181 152 155   Cardiac Enzymes: No results for input(s): "CKTOTAL", "CKMB", "CKMBINDEX", "TROPONINI" in the last 168 hours. BNP: Invalid input(s): "POCBNP" CBG: Recent Labs  Lab 10/04/21 1553 10/04/21 1948 10/04/21 2322 10/05/21 0344 10/05/21 0746  GLUCAP 142* 264* 164* 187* 132*   D-Dimer Recent Labs    10/03/21 1040  DDIMER 0.54*   Hgb A1c No results for input(s): "HGBA1C" in the last 72 hours. Lipid Profile No results for input(s): "CHOL", "HDL", "LDLCALC", "TRIG", "CHOLHDL", "LDLDIRECT" in the last 72 hours. Thyroid function studies Recent Labs    10/03/21 0408  TSH <0.010*   Anemia work up No results for input(s): "VITAMINB12", "FOLATE", "FERRITIN", "TIBC", "IRON", "RETICCTPCT" in the last 72 hours. Urinalysis    Component Value Date/Time   COLORURINE YELLOW (A) 10/03/2021 0408   APPEARANCEUR CLOUDY (A) 10/03/2021 0408   LABSPEC 1.014 10/03/2021 0408   PHURINE 9.0 (H) 10/03/2021 0408   GLUCOSEU 50 (A) 10/03/2021 0408   HGBUR NEGATIVE 10/03/2021 0408   BILIRUBINUR NEGATIVE 10/03/2021 0408   BILIRUBINUR negative 07/13/2018 1651   KETONESUR 20 (A) 10/03/2021 0408   PROTEINUR 30 (A) 10/03/2021 0408   UROBILINOGEN 0.2 07/13/2018 1651   UROBILINOGEN 1.0 07/09/2017 1324   NITRITE NEGATIVE 10/03/2021 0408   LEUKOCYTESUR NEGATIVE 10/03/2021 0408   Sepsis Labs Recent Labs  Lab 10/03/21 0408 10/04/21 0615 10/05/21 0458  WBC 5.1 9.9 7.2   Microbiology Recent Results (from the past 240 hour(s))  Culture, blood (Routine X 2) w Reflex to ID Panel     Status: None (Preliminary result)   Collection  Time: 10/03/21  8:55 PM   Specimen: BLOOD  Result Value Ref Range Status   Specimen Description BLOOD BLOOD RIGHT HAND  Final   Special Requests   Final    BOTTLES DRAWN AEROBIC AND ANAEROBIC Blood Culture adequate volume   Culture   Final    NO GROWTH 2 DAYS Performed at [facility], [address] [place], [place] [address]  Report Status PENDING  Incomplete  Culture, blood (Routine X 2) w Reflex to ID Panel     Status: None (Preliminary result)   Collection Time: 10/03/21  8:57 PM   Specimen: BLOOD  Result Value Ref Range Status   Specimen Description BLOOD BLOOD LEFT HAND  Final   Special Requests   Final    BOTTLES DRAWN AEROBIC AND ANAEROBIC Blood Culture adequate volume   Culture   Final    NO GROWTH 2 DAYS Performed at [facility], [address] [place], [place] [address]    Report Status PENDING  Incomplete     Total time spend on discharging this patient, including the last patient exam, discussing the hospital stay, instructions for ongoing care as it relates to all pertinent caregivers, as well as preparing the medical discharge records, prescriptions, and/or referrals as applicable, is 35 minutes.    Darlin Priestly, MD  [facility] 10/05/2021, 9:37 AM

## 2021-10-05 NOTE — Plan of Care (Signed)

## 2021-10-05 NOTE — Progress Notes (Signed)
Discharge instructions (including medications) discussed with and copy provided to patient. Patient stable at time of discharge.

## 2021-10-07 ENCOUNTER — Ambulatory Visit: Payer: Medicaid Other | Attending: Cardiology | Admitting: Cardiology

## 2021-10-08 ENCOUNTER — Encounter: Payer: Self-pay | Admitting: Cardiology

## 2021-10-08 LAB — CULTURE, BLOOD (ROUTINE X 2)
Culture: NO GROWTH
Culture: NO GROWTH
Special Requests: ADEQUATE
Special Requests: ADEQUATE

## 2021-10-10 LAB — ZINC: Zinc: 63 ug/dL (ref 44–115)

## 2021-10-10 LAB — COPPER, SERUM: Copper: 186 ug/dL — ABNORMAL HIGH (ref 80–158)

## 2021-10-27 ENCOUNTER — Inpatient Hospital Stay
Admission: EM | Admit: 2021-10-27 | Discharge: 2021-10-28 | DRG: 644 | Discharge status: Against Medical Advice | Payer: Medicaid Other | Attending: Student in an Organized Health Care Education/Training Program | Admitting: Student in an Organized Health Care Education/Training Program

## 2021-10-27 ENCOUNTER — Other Ambulatory Visit: Payer: Self-pay

## 2021-10-27 ENCOUNTER — Inpatient Hospital Stay: Payer: Medicaid Other

## 2021-10-27 DIAGNOSIS — F32A Depression, unspecified: Secondary | ICD-10-CM | POA: Diagnosis present

## 2021-10-27 DIAGNOSIS — G4700 Insomnia, unspecified: Secondary | ICD-10-CM | POA: Diagnosis present

## 2021-10-27 DIAGNOSIS — F419 Anxiety disorder, unspecified: Secondary | ICD-10-CM | POA: Diagnosis present

## 2021-10-27 DIAGNOSIS — I110 Hypertensive heart disease with heart failure: Secondary | ICD-10-CM | POA: Diagnosis present

## 2021-10-27 DIAGNOSIS — R109 Unspecified abdominal pain: Secondary | ICD-10-CM | POA: Diagnosis present

## 2021-10-27 DIAGNOSIS — Z79899 Other long term (current) drug therapy: Secondary | ICD-10-CM

## 2021-10-27 DIAGNOSIS — Z5329 Procedure and treatment not carried out because of patient's decision for other reasons: Secondary | ICD-10-CM | POA: Diagnosis not present

## 2021-10-27 DIAGNOSIS — Z91148 Patient's other noncompliance with medication regimen for other reason: Secondary | ICD-10-CM | POA: Diagnosis not present

## 2021-10-27 DIAGNOSIS — I5032 Chronic diastolic (congestive) heart failure: Secondary | ICD-10-CM | POA: Diagnosis present

## 2021-10-27 DIAGNOSIS — Z8249 Family history of ischemic heart disease and other diseases of the circulatory system: Secondary | ICD-10-CM

## 2021-10-27 DIAGNOSIS — Z91199 Patient's noncompliance with other medical treatment and regimen due to unspecified reason: Secondary | ICD-10-CM | POA: Diagnosis not present

## 2021-10-27 DIAGNOSIS — M545 Low back pain, unspecified: Secondary | ICD-10-CM | POA: Diagnosis present

## 2021-10-27 DIAGNOSIS — E0590 Thyrotoxicosis, unspecified without thyrotoxic crisis or storm: Secondary | ICD-10-CM | POA: Diagnosis present

## 2021-10-27 DIAGNOSIS — R32 Unspecified urinary incontinence: Secondary | ICD-10-CM | POA: Diagnosis present

## 2021-10-27 DIAGNOSIS — F39 Unspecified mood [affective] disorder: Secondary | ICD-10-CM | POA: Diagnosis present

## 2021-10-27 DIAGNOSIS — Z825 Family history of asthma and other chronic lower respiratory diseases: Secondary | ICD-10-CM | POA: Diagnosis not present

## 2021-10-27 DIAGNOSIS — I152 Hypertension secondary to endocrine disorders: Secondary | ICD-10-CM

## 2021-10-27 DIAGNOSIS — Z833 Family history of diabetes mellitus: Secondary | ICD-10-CM

## 2021-10-27 DIAGNOSIS — G40909 Epilepsy, unspecified, not intractable, without status epilepticus: Secondary | ICD-10-CM

## 2021-10-27 DIAGNOSIS — D709 Neutropenia, unspecified: Secondary | ICD-10-CM | POA: Diagnosis present

## 2021-10-27 DIAGNOSIS — Z794 Long term (current) use of insulin: Secondary | ICD-10-CM | POA: Diagnosis not present

## 2021-10-27 DIAGNOSIS — Z886 Allergy status to analgesic agent status: Secondary | ICD-10-CM

## 2021-10-27 DIAGNOSIS — Z8049 Family history of malignant neoplasm of other genital organs: Secondary | ICD-10-CM

## 2021-10-27 DIAGNOSIS — E0501 Thyrotoxicosis with diffuse goiter with thyrotoxic crisis or storm: Secondary | ICD-10-CM | POA: Diagnosis not present

## 2021-10-27 DIAGNOSIS — L83 Acanthosis nigricans: Secondary | ICD-10-CM | POA: Diagnosis present

## 2021-10-27 DIAGNOSIS — F1910 Other psychoactive substance abuse, uncomplicated: Secondary | ICD-10-CM | POA: Diagnosis present

## 2021-10-27 DIAGNOSIS — G8929 Other chronic pain: Secondary | ICD-10-CM | POA: Diagnosis present

## 2021-10-27 DIAGNOSIS — E109 Type 1 diabetes mellitus without complications: Secondary | ICD-10-CM | POA: Diagnosis present

## 2021-10-27 DIAGNOSIS — E1159 Type 2 diabetes mellitus with other circulatory complications: Secondary | ICD-10-CM | POA: Diagnosis not present

## 2021-10-27 DIAGNOSIS — Z800 Family history of malignant neoplasm of digestive organs: Secondary | ICD-10-CM

## 2021-10-27 DIAGNOSIS — E0591 Thyrotoxicosis, unspecified with thyrotoxic crisis or storm: Secondary | ICD-10-CM | POA: Diagnosis present

## 2021-10-27 DIAGNOSIS — Z91018 Allergy to other foods: Secondary | ICD-10-CM

## 2021-10-27 DIAGNOSIS — R112 Nausea with vomiting, unspecified: Secondary | ICD-10-CM | POA: Diagnosis present

## 2021-10-27 DIAGNOSIS — E669 Obesity, unspecified: Secondary | ICD-10-CM | POA: Diagnosis present

## 2021-10-27 LAB — BRAIN NATRIURETIC PEPTIDE: B Natriuretic Peptide: 715.6 pg/mL — ABNORMAL HIGH (ref 0.0–100.0)

## 2021-10-27 LAB — URINALYSIS, ROUTINE W REFLEX MICROSCOPIC
Bilirubin Urine: NEGATIVE
Glucose, UA: 50 mg/dL — AB
Hgb urine dipstick: NEGATIVE
Ketones, ur: 80 mg/dL — AB
Leukocytes,Ua: NEGATIVE
Nitrite: NEGATIVE
Protein, ur: 100 mg/dL — AB
Specific Gravity, Urine: 1.018 (ref 1.005–1.030)
pH: 9 — ABNORMAL HIGH (ref 5.0–8.0)

## 2021-10-27 LAB — CBC
HCT: 32.9 % — ABNORMAL LOW (ref 36.0–46.0)
Hemoglobin: 10.5 g/dL — ABNORMAL LOW (ref 12.0–15.0)
MCH: 23.5 pg — ABNORMAL LOW (ref 26.0–34.0)
MCHC: 31.9 g/dL (ref 30.0–36.0)
MCV: 73.6 fL — ABNORMAL LOW (ref 80.0–100.0)
Platelets: 183 10*3/uL (ref 150–400)
RBC: 4.47 MIL/uL (ref 3.87–5.11)
RDW: 15.9 % — ABNORMAL HIGH (ref 11.5–15.5)
WBC: 8.1 10*3/uL (ref 4.0–10.5)
nRBC: 0 % (ref 0.0–0.2)

## 2021-10-27 LAB — TSH: TSH: <0.01 u[IU]/mL — ABNORMAL LOW (ref 0.350–4.500)

## 2021-10-27 LAB — URINE DRUG SCREEN, QUALITATIVE (ARMC ONLY)
Amphetamines, Ur Screen: NOT DETECTED
Barbiturates, Ur Screen: NOT DETECTED
Benzodiazepine, Ur Scrn: NOT DETECTED
Cannabinoid 50 Ng, Ur ~~LOC~~: POSITIVE — AB
Cocaine Metabolite,Ur ~~LOC~~: NOT DETECTED
MDMA (Ecstasy)Ur Screen: NOT DETECTED
Methadone Scn, Ur: NOT DETECTED
Opiate, Ur Screen: POSITIVE — AB
Phencyclidine (PCP) Ur S: NOT DETECTED
Tricyclic, Ur Screen: NOT DETECTED

## 2021-10-27 LAB — BASIC METABOLIC PANEL
Anion gap: 13 (ref 5–15)
BUN: 13 mg/dL (ref 6–20)
CO2: 23 mmol/L (ref 22–32)
Calcium: 10.8 mg/dL — ABNORMAL HIGH (ref 8.9–10.3)
Chloride: 104 mmol/L (ref 98–111)
Creatinine, Ser: 0.37 mg/dL — ABNORMAL LOW (ref 0.44–1.00)
GFR, Estimated: >60 mL/min (ref >60)
Glucose, Bld: 141 mg/dL — ABNORMAL HIGH (ref 70–99)
Potassium: 3.2 mmol/L — ABNORMAL LOW (ref 3.5–5.1)
Sodium: 140 mmol/L (ref 135–145)

## 2021-10-27 LAB — T4, FREE: Free T4: >5.5 ng/dL — ABNORMAL HIGH (ref 0.61–1.12)

## 2021-10-27 LAB — HEPATIC FUNCTION PANEL
ALT: 19 U/L (ref 0–44)
AST: 25 U/L (ref 15–41)
Albumin: 3.8 g/dL (ref 3.5–5.0)
Alkaline Phosphatase: 104 U/L (ref 38–126)
Bilirubin, Direct: 0.3 mg/dL — ABNORMAL HIGH (ref 0.0–0.2)
Indirect Bilirubin: 1.3 mg/dL — ABNORMAL HIGH (ref 0.3–0.9)
Total Bilirubin: 1.6 mg/dL — ABNORMAL HIGH (ref 0.3–1.2)
Total Protein: 7.2 g/dL (ref 6.5–8.1)

## 2021-10-27 LAB — LIPASE, BLOOD: Lipase: 27 U/L (ref 11–51)

## 2021-10-27 LAB — LACTIC ACID, PLASMA
Lactic Acid, Venous: 1.5 mmol/L (ref 0.5–1.9)
Lactic Acid, Venous: 1.6 mmol/L (ref 0.5–1.9)

## 2021-10-27 LAB — CBG MONITORING, ED: Glucose-Capillary: 149 mg/dL — ABNORMAL HIGH (ref 70–99)

## 2021-10-27 LAB — MRSA NEXT GEN BY PCR, NASAL: MRSA by PCR Next Gen: NOT DETECTED

## 2021-10-27 LAB — GLUCOSE, CAPILLARY
Glucose-Capillary: 147 mg/dL — ABNORMAL HIGH (ref 70–99)
Glucose-Capillary: 194 mg/dL — ABNORMAL HIGH (ref 70–99)

## 2021-10-27 MED ORDER — LAMOTRIGINE 25 MG PO TABS
100.0000 mg | ORAL_TABLET | Freq: Three times a day (TID) | ORAL | Status: DC
  Administered 2021-10-27: 100 mg via ORAL
  Filled 2021-10-27: qty 4

## 2021-10-27 MED ORDER — INSULIN ASPART 100 UNIT/ML IJ SOLN
0.0000 [IU] | INTRAMUSCULAR | Status: DC
  Administered 2021-10-27: 3 [IU] via SUBCUTANEOUS
  Administered 2021-10-27: 4 [IU] via SUBCUTANEOUS
  Administered 2021-10-28: 7 [IU] via SUBCUTANEOUS
  Administered 2021-10-28: 15 [IU] via SUBCUTANEOUS
  Filled 2021-10-27 (×4): qty 1

## 2021-10-27 MED ORDER — POTASSIUM CHLORIDE 10 MEQ/100ML IV SOLN
10.0000 meq | INTRAVENOUS | Status: DC
  Filled 2021-10-27 (×2): qty 100

## 2021-10-27 MED ORDER — FUROSEMIDE 20 MG PO TABS: 20.0000 mg | ORAL_TABLET | Freq: Every day | ORAL | Status: DC | PRN

## 2021-10-27 MED ORDER — LOSARTAN POTASSIUM 50 MG PO TABS
25.0000 mg | ORAL_TABLET | Freq: Every day | ORAL | Status: DC
  Administered 2021-10-27: 25 mg via ORAL
  Filled 2021-10-27: qty 1

## 2021-10-27 MED ORDER — ONDANSETRON HCL 4 MG/2ML IJ SOLN
4.0000 mg | Freq: Once | INTRAMUSCULAR | Status: AC
  Administered 2021-10-27: 4 mg via INTRAVENOUS
  Filled 2021-10-27: qty 2

## 2021-10-27 MED ORDER — METHIMAZOLE 10 MG PO TABS
20.0000 mg | ORAL_TABLET | Freq: Two times a day (BID) | ORAL | Status: DC
  Filled 2021-10-27: qty 2

## 2021-10-27 MED ORDER — METHIMAZOLE 10 MG PO TABS
20.0000 mg | ORAL_TABLET | Freq: Three times a day (TID) | ORAL | Status: DC
  Administered 2021-10-27: 20 mg via ORAL
  Filled 2021-10-27 (×2): qty 2

## 2021-10-27 MED ORDER — METOCLOPRAMIDE HCL 10 MG PO TABS
10.0000 mg | ORAL_TABLET | Freq: Three times a day (TID) | ORAL | Status: DC
  Administered 2021-10-28: 10 mg via ORAL
  Filled 2021-10-27 (×2): qty 1

## 2021-10-27 MED ORDER — DEXAMETHASONE SODIUM PHOSPHATE 4 MG/ML IJ SOLN
2.0000 mg | Freq: Four times a day (QID) | INTRAMUSCULAR | Status: DC
  Administered 2021-10-27: 2 mg via INTRAVENOUS
  Filled 2021-10-27: qty 1

## 2021-10-27 MED ORDER — ORAL CARE MOUTH RINSE: 15.0000 mL | OROMUCOSAL | Status: DC | PRN

## 2021-10-27 MED ORDER — PROPRANOLOL HCL 1 MG/ML IV SOLN
1.0000 mg | INTRAVENOUS | Status: DC
  Filled 2021-10-27 (×3): qty 1

## 2021-10-27 MED ORDER — POTASSIUM CHLORIDE CRYS ER 20 MEQ PO TBCR
40.0000 meq | EXTENDED_RELEASE_TABLET | Freq: Once | ORAL | Status: AC
  Administered 2021-10-27: 40 meq via ORAL
  Filled 2021-10-27: qty 2

## 2021-10-27 MED ORDER — DOCUSATE SODIUM 100 MG PO CAPS: 100.0000 mg | ORAL_CAPSULE | Freq: Two times a day (BID) | ORAL | Status: DC | PRN

## 2021-10-27 MED ORDER — PROPRANOLOL HCL 20 MG PO TABS
80.0000 mg | ORAL_TABLET | Freq: Three times a day (TID) | ORAL | Status: DC
  Administered 2021-10-27: 80 mg via ORAL
  Filled 2021-10-27: qty 4

## 2021-10-27 MED ORDER — HYDROMORPHONE HCL 1 MG/ML IJ SOLN
0.5000 mg | Freq: Once | INTRAMUSCULAR | Status: AC
  Administered 2021-10-27: 0.5 mg via INTRAVENOUS
  Filled 2021-10-27: qty 1

## 2021-10-27 MED ORDER — HYDROMORPHONE HCL 1 MG/ML IJ SOLN
0.5000 mg | Freq: Once | INTRAMUSCULAR | Status: AC
  Administered 2021-10-27: 0.5 mg via INTRAVENOUS
  Filled 2021-10-27: qty 0.5

## 2021-10-27 MED ORDER — SODIUM CHLORIDE 0.9 % IV BOLUS
1000.0000 mL | Freq: Once | INTRAVENOUS | Status: AC
  Administered 2021-10-27: 1000 mL via INTRAVENOUS

## 2021-10-27 MED ORDER — DEXAMETHASONE SODIUM PHOSPHATE 4 MG/ML IJ SOLN
4.0000 mg | Freq: Three times a day (TID) | INTRAMUSCULAR | Status: DC
  Administered 2021-10-28: 4 mg via INTRAVENOUS
  Filled 2021-10-27: qty 1

## 2021-10-27 MED ORDER — CHLORHEXIDINE GLUCONATE CLOTH 2 % EX PADS
6.0000 | MEDICATED_PAD | Freq: Every day | CUTANEOUS | Status: DC
  Administered 2021-10-27: 6 via TOPICAL

## 2021-10-27 MED ORDER — ENOXAPARIN SODIUM 30 MG/0.3ML IJ SOSY: 30.0000 mg | PREFILLED_SYRINGE | INTRAMUSCULAR | Status: DC

## 2021-10-27 MED ORDER — PROPYLTHIOURACIL 50 MG PO TABS
600.0000 mg | ORAL_TABLET | Freq: Once | ORAL | Status: AC
  Administered 2021-10-27: 600 mg via ORAL
  Filled 2021-10-27: qty 12

## 2021-10-27 MED ORDER — POLYETHYLENE GLYCOL 3350 17 G PO PACK: 17.0000 g | PACK | Freq: Every day | ORAL | Status: DC | PRN

## 2021-10-27 MED ORDER — QUETIAPINE FUMARATE 200 MG PO TABS
300.0000 mg | ORAL_TABLET | Freq: Every day | ORAL | Status: DC
  Administered 2021-10-27: 300 mg via ORAL
  Filled 2021-10-27: qty 1

## 2021-10-27 MED ORDER — ENOXAPARIN SODIUM 40 MG/0.4ML IJ SOSY: 40.0000 mg | PREFILLED_SYRINGE | INTRAMUSCULAR | Status: DC

## 2021-10-27 NOTE — Progress Notes (Signed)
PHARMACIST - PHYSICIAN COMMUNICATION  CONCERNING:  Enoxaparin (Lovenox) for DVT Prophylaxis    RECOMMENDATION: Patient was prescribed enoxaprin 40mg  q24 hours for VTE prophylaxis.   Filed Weights   10/27/21 1516  Weight: 64.8 kg (142 lb 13.7 oz)    Body mass index is 26.13 kg/m.  Estimated Creatinine Clearance: 96.7 mL/min (A) (by C-G formula based on SCr of 0.37 mg/dL (L)).   Patient is candidate for enoxaparin 40mg  every 24 hours based on CrCl >6ml/min   DESCRIPTION: Pharmacy has adjusted enoxaparin dose per [facility] policy.  Patient is now receiving enoxaparin 40 mg every 24 hours    Darrick Penna, PharmD Clinical Pharmacist  10/27/2021 7:31 PM

## 2021-10-27 NOTE — ED Notes (Signed)
Pt noted to have seizure-like activity/generalized shaking, but will respond and remains A&Ox4.  Pt reports generalized abdominal and n/v x2 days and headache after falling this morning.  Pain score 10/10.  Pt reports shaking was taking place prior to fall.    Pt has not taken anything for symptoms.

## 2021-10-27 NOTE — ED Triage Notes (Signed)
Pt presents via EMS c/o possible seizure at home. Has hx of seizures. Also c/o abd pain and headache. Reports has hx of chronic abd pain. A&Ox4.

## 2021-10-27 NOTE — Consult Note (Signed)
PHARMACY CONSULT NOTE - FOLLOW UP  Pharmacy Consult for Electrolyte Monitoring and Replacement   Recent Labs: Potassium (mmol/L)  Date Value  10/27/2021 3.2 (L)   Magnesium (mg/dL)  Date Value  10/05/2021 1.9   Calcium (mg/dL)  Date Value  10/27/2021 10.8 (H)   Albumin (g/dL)  Date Value  10/27/2021 3.8  10/05/2018 4.7   Phosphorus (mg/dL)  Date Value  10/03/2021 4.7 (H)   Sodium (mmol/L)  Date Value  10/27/2021 140  10/05/2018 139     Assessment: 21 y.o. female with history of seizure disorder on Lamictal, type 1 diabetes, CHF, and hypothyroidism who presents with abdominal pain, nausea and vomiting, and headache. Pharmacy consulted for repletion of electrolytes  Goal of Therapy:  Electrolytes WNL  Plan:  K 3.2. Give Kcl 2meq IV q1hr x3 doses Recheck BMP with AM labs  Darrick Penna ,PharmD Clinical Pharmacist 10/27/2021 7:33 PM

## 2021-10-27 NOTE — ED Triage Notes (Signed)
Pt in via EMS from home with c/o having a seizure today. EMS reports per fire, pt was laying prone on the floor upon their arrival. Pt also had a 15 sec episode of seizure activity en route with no post ictal phase. Pt requested pain medication, nausea medication and a blanket with immediate return to consciousness. HR 130's, 164/106, 99% RA, CBG 171

## 2021-10-27 NOTE — ED Provider Notes (Signed)
[facility] Provider Note    Event Date/Time   First MD Initiated Contact with Patient 10/27/21 1634     (approximate)   History   Seizures, Abdominal Pain, and Emesis   HPI  Nancy [name] is a 21 y.o. female with history of seizure disorder on Lamictal, type 1 diabetes, CHF, and hypothyroidism who presents with abdominal pain, nausea and vomiting, and headache.  She also states she has had multiple seizures today.  The patient states that this is similar to her last presentation to the hospital last month but worse.  She states that she has been compliant with all of her seizure and thyroid medications including today.  She denies any diarrhea or fever.  I reviewed the past medical records.  The patient was most recently admitted at the end of last month initially with a complaint of seizures and abdominal pain.  Abdominal imaging was negative.  She was found to be severely hyperthyroid and treated in the ICU for hyperthyroidism.  Previously per the hospitalist discharge summary she was admitted in August after being found unresponsive and was treated for thyroid storm at that time.   Physical Exam   Triage Vital Signs: ED Triage Vitals  Enc Vitals Group     BP 10/27/21 1515 (!) 174/100     Pulse Rate 10/27/21 1515 65     Resp 10/27/21 1515 (!) 22     Temp 10/27/21 1515 98.6 F (37 C)     Temp Source 10/27/21 1515 Oral     SpO2 10/27/21 1515 100 %     Weight 10/27/21 1516 142 lb 13.7 oz (64.8 kg)     Height --      Head Circumference --      Peak Flow --      Pain Score 10/27/21 1515 10     Pain Loc --      Pain Edu? --      Excl. in GC? --     Most recent vital signs: Vitals:   10/27/21 1654 10/27/21 1835  BP: (!) 193/105 (!) 190/98  Pulse: (!) 135 (!) 139  Resp: (!) 24 (!) 22  Temp:    SpO2: 100% 100%     General: Alert and oriented, uncomfortable appearing. CV:  Tachycardic, good peripheral perfusion.  Resp:  Normal effort.   Abd:  Soft with mild diffuse tenderness but no peritoneal signs.  No distention.  Other:  EOMI.  PERRLA.  Motor intact in all extremities.  Normal coordination.  Intermittent tremors/shaking diffusely.   ED Results / Procedures / Treatments   Labs (all labs ordered are listed, but only abnormal results are displayed) Labs Reviewed  CBC - Abnormal; Notable for the following components:      Result Value   Hemoglobin 10.5 (*)    HCT 32.9 (*)    MCV 73.6 (*)    MCH 23.5 (*)    RDW 15.9 (*)    All other components within normal limits  BASIC METABOLIC PANEL - Abnormal; Notable for the following components:   Potassium 3.2 (*)    Glucose, Bld 141 (*)    Creatinine, Ser 0.37 (*)    Calcium 10.8 (*)    All other components within normal limits  TSH - Abnormal; Notable for the following components:   TSH <0.010 (*)    All other components within normal limits  T4, FREE - Abnormal; Notable for the following components:   Free T4 >5.50 (*)  All other components within normal limits  HEPATIC FUNCTION PANEL - Abnormal; Notable for the following components:   Total Bilirubin 1.6 (*)    Bilirubin, Direct 0.3 (*)    Indirect Bilirubin 1.3 (*)    All other components within normal limits  LACTIC ACID, PLASMA  LIPASE, BLOOD  T3, FREE  LACTIC ACID, PLASMA  URINALYSIS, ROUTINE W REFLEX MICROSCOPIC  URINE DRUG SCREEN, QUALITATIVE ([facility] ONLY)  LAMOTRIGINE LEVEL  BRAIN NATRIURETIC PEPTIDE  CBC  BASIC METABOLIC PANEL  MAGNESIUM  PHOSPHORUS     EKG  ED ECG REPORT I, Arta Silence, the attending physician, personally viewed and interpreted this ECG.  Date: 10/27/2021 EKG Time: 1753 Rate: 132 Rhythm: Sinus tachycardia cardia QRS Axis: normal Intervals: normal ST/T Wave abnormalities: normal Narrative Interpretation: no evidence of acute ischemia    RADIOLOGY     PROCEDURES:  Critical Care performed: Yes, see critical care procedure note(s)  .Critical  Care  Performed by: Arta Silence, MD Authorized by: Arta Silence, MD   Critical care provider statement:    Critical care time (minutes):  30   Critical care time was exclusive of:  Separately billable procedures and treating other patients   Critical care was necessary to treat or prevent imminent or life-threatening deterioration of the following conditions:  Endocrine crisis   Critical care was time spent personally by me on the following activities:  Development of treatment plan with patient or surrogate, discussions with consultants, evaluation of patient's response to treatment, examination of patient, ordering and review of laboratory studies, ordering and review of radiographic studies, ordering and performing treatments and interventions, pulse oximetry, re-evaluation of patient's condition, review of old charts and obtaining history from patient or surrogate   Care discussed with: admitting provider      MEDICATIONS ORDERED IN ED: Medications  propranolol (INDERAL) injection 1 mg (has no administration in time range)  propylthiouracil (PTU) tablet 600 mg (has no administration in time range)  dexamethasone (DECADRON) injection 2 mg (has no administration in time range)  docusate sodium (COLACE) capsule 100 mg (has no administration in time range)  polyethylene glycol (MIRALAX / GLYCOLAX) packet 17 g (has no administration in time range)  enoxaparin (LOVENOX) injection 40 mg (has no administration in time range)  potassium chloride 10 mEq in 100 mL IVPB (has no administration in time range)  ondansetron (ZOFRAN) injection 4 mg (4 mg Intravenous Given 10/27/21 1728)  HYDROmorphone (DILAUDID) injection 0.5 mg (0.5 mg Intravenous Given 10/27/21 1728)  sodium chloride 0.9 % bolus 1,000 mL (0 mLs Intravenous Stopped 10/27/21 1834)  HYDROmorphone (DILAUDID) injection 0.5 mg (0.5 mg Intravenous Given 10/27/21 1830)     IMPRESSION / MDM / [place] / ED COURSE   I reviewed the triage vital signs and the nursing notes.  21 year old female with PMH as noted above presents with abdominal pain, nausea and vomiting, headache, and multiple episodes she is describing as seizures.  On exam the patient is uncomfortable appearing, tachycardic, and hypertensive.  There are no focal abdominal findings and she is alert and oriented with a nonfocal neurologic exam.  Differential diagnosis includes, but is not limited to, gastroenteritis, gastritis, other acute intra-abdominal process, cyclical vomiting, recurrent hyperthyroidism/thyroid storm, DKA, other metabolic disturbance, less likely primary CNS cause.  Patient has not demonstrated any seizure activity in the ED so far.  We will obtain lab work-up, give fluids, antiemetic, pain medication, and reassess.  At this time based on the patient's exam there is no indication  for emergent imaging especially given similar presentations with no intra-abdominal findings although I will reassess based on lab results and the patient's clinical status.  Patient's presentation is most consistent with acute presentation with potential threat to life or bodily function.  The patient is on the cardiac monitor to evaluate for evidence of arrhythmia and/or significant heart rate changes.   ----------------------------------------- 7:32 PM on 10/27/2021 -----------------------------------------  Lab work-up is significant for undetectable TSH and T4 above the measurement threshold, consistent with acute thyrotoxicosis.  Electrolytes are unremarkable.  There is no leukocytosis.  EKG is nonischemic.  The patient's abdominal pain is improved.  There is no indication for imaging at this time.  However she remains significantly tachycardic and hypertensive.  I ordered propranolol, PTU, and dexamethasone.  The patient is not altered and there is no evidence of thyroid storm at this time.   I consulted APP Ouma from the ICU and discussed  the patient with her.  She evaluated the patient and recommends ICU admission.     FINAL CLINICAL IMPRESSION(S) / ED DIAGNOSES   Final diagnoses:  Thyrotoxicosis with thyrotoxic crisis, unspecified thyrotoxicosis type     Rx / DC Orders   ED Discharge Orders     None        Note:  This document was prepared using Dragon voice recognition software and may include unintentional dictation errors.    Dionne Bucy, MD 10/27/21 [phone]

## 2021-10-27 NOTE — H&P (Cosign Needed)
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   ED Course: Initial vital signs showed HR of 135 beats/minute, BP 193/105  mm Hg, the RR 24 breaths/minute, and the oxygen saturation 100 % on RA and a temperature of 98.17F (37C).   Pertinent Labs/Diagnostics Findings: Chemistry:Na+/ K+: 140/3.2 Glucose:141  CBC: WBC: 8.1 Hgb/Hct: 10.5/32.9  Other Lab findings: TSH <0.01, Free T3 pending, Free T4 >5.50, BNP:  715 Imaging: CXR. No acute cardiopulmonary process Medications received in the ED:PTU, steroids, propranolol, Dilaudid and gentle fluid   PCCM consulted for admission and further management.  Past Medical History    Acanthosis nigricans     Anxiety     CHF (congestive heart failure) (HCC)     Chronic lower back pain     Depression     DKA, type 1 (HCC) 09/13/2018   Dyspepsia     Obesity     Ovarian cyst      pt is not aware of this hx (11/24/2017)   Precocious adrenarche (HCC)     Premature baby     Seizures (HCC)     Type II diabetes mellitus (HCC)     Significant Hospital Events   10/22:Admit with breakthrough seizures in the setting of hyperthyroid state  Consults:  None  Procedures:  None  Significant Diagnostic Tests:  10/22:Chest Xray>No acute cardiopulmonary process  Micro Data:  None  Antimicrobials:  None  OBJECTIVE  Blood pressure (!) 190/98, pulse (!) 139, temperature 98.5 F (36.9 C), temperature source Oral, resp. rate 20, weight 64.8 kg, SpO2 100 %.       No intake or output data in the 24  hours ending 10/27/21 2042 Filed Weights   10/27/21 1516  Weight: 64.8 kg   Physical Examination  GENERAL:21 year-old critically ill patient lying in the bed with no acute distress.  EYES: Pupils equal, round, reactive to light and accommodation. No scleral icterus. Extraocular muscles intact.  HEENT: Head atraumatic, normocephalic. Oropharynx and nasopharynx clear.  NECK:  Supple, no jugular venous distention. No thyroid enlargement, no tenderness.  LUNGS: Normal breath sounds bilaterally, no wheezing, rales,rhonchi or crepitation. No use of accessory muscles of respiration.  CARDIOVASCULAR: S1, S2 normal. No murmurs, rubs, or gallops.  ABDOMEN: Soft, nontender, nondistended. Bowel sounds present. No organomegaly or mass.  EXTREMITIES: Upper and lower extremities are atraumatic in appearance without tenderness or deformity. No swelling or erythema. Full range of motion is noted to all joints. Muscle strength is 5/5 bilaterally. Tendon function is normal. Capillary refill is less than 3 seconds in all extremities. Pulses palpable.  NEUROLOGIC:The patient is awake, alert and oriented to person, place, and time with normal speech. Motor function is normal with muscle strength 5/5 bilaterally to upper and lower extremities. Sensation is intact bilaterally. Reflexes 2+ bilaterally. Cranial nerves are intact. Cerebellar function is intact. Memory is normal and thought process is intact. Gait not checked.  PSYCHIATRIC: Appropriate mood and affect. The patient is  SKIN: No obvious rash, lesion, or ulcer.   Labs/imaging that I havepersonally reviewed  (right  click and "Reselect all SmartList Selections" daily)     Labs   CBC: Recent Labs  Lab 10/27/21 1530  WBC 8.1  HGB 10.5*  HCT 32.9*  MCV 73.6*  PLT [id number]    Basic Metabolic Panel: Recent Labs  Lab 10/27/21 1530  NA 140  K 3.2*  CL 104  CO2 23  GLUCOSE 141*  BUN 13  CREATININE 0.37*  CALCIUM 10.8*   GFR: Estimated Creatinine  Clearance: 96.7 mL/min (A) (by C-G formula based on SCr of 0.37 mg/dL (L)). Recent Labs  Lab 10/27/21 1530 10/27/21 1720  WBC 8.1  --   LATICACIDVEN  --  1.5    Liver Function Tests: Recent Labs  Lab 10/27/21 1720  AST 25  ALT 19  ALKPHOS 104  BILITOT 1.6*  PROT 7.2  ALBUMIN 3.8   Recent Labs  Lab 10/27/21 1720  LIPASE 27   No results for input(s): "AMMONIA" in the last 168 hours.  ABG    Component Value Date/Time   PHART 7.402 12/01/2018 0120   PCO2ART 34.2 12/01/2018 0120   PO2ART 84.0 12/01/2018 0120   HCO3 28.4 (H) 06/28/2021 1117   TCO2 29 04/10/2020 1105   ACIDBASEDEF 4.4 (H) 08/06/2019 1335   O2SAT 91.4 06/28/2021 1117     Coagulation Profile: No results for input(s): "INR", "PROTIME" in the last 168 hours.  Cardiac Enzymes: No results for input(s): "CKTOTAL", "CKMB", "CKMBINDEX", "TROPONINI" in the last 168 hours.  HbA1C: Hgb A1c MFr Bld  Date/Time Value Ref Range Status  08/21/2021 09:32 AM 6.4 (H) 4.8 - 5.6 % Final    Comment:    (NOTE) Pre diabetes:          5.7%-6.4%  Diabetes:              >6.4%  Glycemic control for   <7.0% adults with diabetes   02/13/2021 03:28 AM 6.7 (H) 4.8 - 5.6 % Final    Comment:    (NOTE) Pre diabetes:          5.7%-6.4%  Diabetes:              >6.4%  Glycemic control for   <7.0% adults with diabetes     CBG: Recent Labs  Lab 10/27/21 2020  GLUCAP 149*    Review of Systems:   UNABLE TO OBTAIN, PATIENT NOT RELIABLE HISTORIAN   Past Medical History  She,  has a past medical history of Acanthosis nigricans, Anxiety, CHF (congestive heart failure) ([place]), Chronic lower back pain, Depression, DKA, type 1 ([place]) (09/13/2018), Dyspepsia, Obesity, Ovarian cyst, Precocious adrenarche ([place]), Premature baby, Seizures ([place]), and Type II diabetes mellitus ([place]).   Surgical History    Past Surgical History:  Procedure Laterality Date   ABDOMINAL HERNIA REPAIR     "I was a baby"   BIOPSY  10/12/2018    Procedure: BIOPSY;  Surgeon: Jackquline Denmark, MD;  Location: [facility] ENDOSCOPY;  Service: Endoscopy;;   BIOPSY  02/28/2020   Procedure: BIOPSY;  Surgeon: Lavena Bullion, DO;  Location: [facility] ENDOSCOPY;  Service: Gastroenterology;;   ESOPHAGOGASTRODUODENOSCOPY (EGD) WITH PROPOFOL N/A 10/12/2018   Procedure: ESOPHAGOGASTRODUODENOSCOPY (EGD) WITH PROPOFOL;  Surgeon: Jackquline Denmark, MD;  Location[facility] ENDOSCOPY;  Service: Endoscopy;  Laterality: N/A;   ESOPHAGOGASTRODUODENOSCOPY (EGD) WITH PROPOFOL N/A 02/28/2020   Procedure: ESOPHAGOGASTRODUODENOSCOPY (EGD) WITH PROPOFOL;  Surgeon: Lavena Bullion, DO;  Location: [facility];  Service: Gastroenterology;  Laterality: N/A;   FLEXIBLE SIGMOIDOSCOPY N/A 02/28/2020   Procedure: FLEXIBLE SIGMOIDOSCOPY;  Surgeon:  Cirigliano, Dominic Pea, DO;  Location: [facility] ENDOSCOPY;  Service: Gastroenterology;  Laterality: N/A;   HERNIA REPAIR     LEFT HEART CATH AND CORONARY ANGIOGRAPHY N/A 10/13/2018   Procedure: LEFT HEART CATH AND CORONARY ANGIOGRAPHY;  Surgeon: Burnell Blanks, MD;  Location: [facility] LAB;  Service: Cardiovascular;  Laterality: N/A;   TONSILLECTOMY AND ADENOIDECTOMY     WISDOM TOOTH EXTRACTION  2017     Social History   reports that she has never smoked. She has never used smokeless tobacco. She reports that she does not drink alcohol and does not use drugs.   Family History   Her family history includes Allergic rhinitis in her mother; Asthma in her mother; Cervical cancer in her mother; Colon cancer in her maternal grandfather; Diabetes in her father, mother, paternal grandfather, and paternal grandmother; Eczema in her mother; Hyperlipidemia in her father; Hypertension in her father, maternal grandfather, mother, and paternal aunt; Obesity in her father, mother, paternal grandfather, and paternal grandmother. There is no history of Angioedema, Immunodeficiency, Urticaria, Stomach cancer, or Esophageal cancer.   Allergies Allergies  Allergen  Reactions   Oatmeal Anaphylaxis   Tomato Anaphylaxis   Ibuprofen Other (See Comments)    GI MD said to not take this anymore     Home Medications  Prior to Admission medications   Medication Sig Start Date End Date Taking? Authorizing Provider  FLUoxetine (PROZAC) 20 MG tablet Take 20 mg by mouth daily.   Yes [provider]  furosemide (LASIX) 20 MG tablet Take 20 mg by mouth daily as needed. 09/04/21  Yes [provider]  insulin glargine (LANTUS) 100 unit/mL SOPN Inject 20 Units into the skin 2 (two) times daily. Patient taking differently: Inject 60 Units into the skin 2 (two) times daily. 08/23/21  Yes Wouk, Ailene Rud, MD  lamoTRIgine (LAMICTAL) 100 MG tablet Take 1 tablet (100 mg total) by mouth in the morning, at noon, and at bedtime. 10/05/21 01/03/22 Yes Enzo Bi, MD  loperamide (IMODIUM) 2 MG capsule Take 2 capsules (4 mg total) by mouth as needed for diarrhea or loose stools. 08/23/21  Yes Wouk, Ailene Rud, MD  lubiprostone (AMITIZA) 8 MCG capsule Take 8 mcg by mouth 2 (two) times daily with a meal.   Yes [provider]  methimazole (TAPAZOLE) 10 MG tablet Take 2 tablets (20 mg total) by mouth 2 (two) times daily. 10/05/21 01/03/22 Yes Enzo Bi, MD  metoCLOPramide (REGLAN) 10 MG tablet Take 1 tablet (10 mg total) by mouth 3 (three) times daily with meals. 05/07/21 05/07/22 Yes Hinda Kehr, MD  ondansetron (ZOFRAN-ODT) 4 MG disintegrating tablet Take 1 tablet (4 mg total) by mouth every 8 (eight) hours as needed. 07/16/21  Yes Vladimir Crofts, MD  propranolol (INDERAL) 80 MG tablet Take 1 tablet (80 mg total) by mouth 3 (three) times daily. 10/05/21 01/03/22 Yes Enzo Bi, MD  QUEtiapine (SEROQUEL) 50 MG tablet Take 50 mg by mouth 2 (two) times daily.   Yes [provider]  Continuous Blood Gluc Sensor (FREESTYLE LIBRE 2 SENSOR) MISC Use as directed 08/23/21   Wouk, Ailene Rud, MD  Glucagon (BAQSIMI TWO PACK) 3 MG/DOSE POWD Place 1 spray into the nose  as needed. 08/23/21   Wouk, Ailene Rud, MD  losartan (COZAAR) 25 MG tablet Take 1 tablet (25 mg total) by mouth daily. Patient not taking: Reported on 10/27/2021 08/23/21   Gwynne Edinger, MD  promethazine (PHENERGAN) 25 MG suppository Place 1 suppository (25 mg  total) rectally every 8 (eight) hours as needed for nausea or vomiting. 06/30/21   Nance Pear, MD  prochlorperazine (COMPAZINE) 25 MG suppository PLACE 1 SUPPOSITORY (25 MG TOTAL) RECTALLY EVERY TWELVE HOURS AS NEEDED FOR NAUSEA OR VOMITING. 02/29/20 02/29/20  Jonetta Osgood, MD  Scheduled Meds:  Chlorhexidine Gluconate Cloth  6 each Topical Daily   [START ON 10/28/2021] dexamethasone (DECADRON) injection  4 mg Intravenous Q8H   [START ON 10/28/2021] enoxaparin (LOVENOX) injection  40 mg Subcutaneous Q24H   insulin aspart  0-20 Units Subcutaneous Q4H   lamoTRIgine  100 mg Oral TID   losartan  25 mg Oral Daily   methimazole  20 mg Oral TID   [START ON 10/28/2021] metoCLOPramide  10 mg Oral TID WC   propranolol  80 mg Oral TID   QUEtiapine  300 mg Oral QHS   Continuous Infusions: PRN Meds:.docusate sodium, [START ON 10/28/2021] furosemide, mouth rinse, polyethylene glycol   Active Hospital Problem list     Assessment & Plan:  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 Continue home Methimazole 20 mg Q4hr - Start propranolol 60mg  q4hrs, will titrate up to 80 if needed based on HR and BP. - Start Lugol's iodine solution 0.26mL q8hrs (give first dose at least 1 hour after first PTU dose) - Given 2 mg decadron in the ER. Continue decadron 4mg  q8hrs.  - Check BNP - thyroid antibodies on 8/16 elevated - Check UA and UDS - Blood glucose checks q1hr, may require SSI with steroids - Follows with Endocrinology on outpatient basis however unclear if she has been compliant with her medication per pharmacy reconciliation records.  Seizure Disorder - Likely breakthrough  seizures Unclear if she has been compliant with her medications - Check Lamictal levels - will continue her home Lamictal - Seizure precautions - Ativan PRN for seizure breathrough   Chronic HFpEF (recent Echo with EF of 08/2021 60 to 65%) BNP~slightly elevated at 715 although does not appear to be volume overloaded - Continuous cardiac monitoring - Maintain MAP greater than 65 - IV Lasix as blood pressure and renal function permits; currently on Lasix 20 mg daily - Continue Losartan   T2DM HgbA1c  - CBG's Q4Hr; Target range of 140 to 180 - SSI - Follow ICU Hypo/Hyperglycemia protocol  Mood Disorder Hx Anxiety and Depression, Insomnia -Continue home Seroquel, Prozac  Best practice:  Diet:  Oral Pain/Anxiety/Delirium protocol (if indicated): No VAP protocol (if indicated): Not indicated DVT prophylaxis: LMWH GI prophylaxis: N/A Glucose control:  SSI Yes Central venous access:  N/A Arterial line:  N/A Foley:  N/A Mobility:  bed rest  PT consulted: N/A Last date of multidisciplinary goals of care discussion [10/22] Code Status:  full code Disposition: ICU   = Goals of Care = Code Status Order: FULL   Primary Emergency Contact: [place], Home Phone: [phone] Wishes to pursue full aggressive treatment and intervention options, including CPR and intubation, but goals of care will be addressed on going with family if that should become necessary.   Critical care time: 45 minutes       Rufina Falco, DNP, CCRN, FNP-C, AGACNP-BC Acute Care Nurse Practitioner [facility]  PCCM on call pager [phone] until 7 am

## 2021-10-27 NOTE — Plan of Care (Signed)
Dicussed with patient plan of care for the evening, pain management and admission questions with some teach back displayed.  Went over some medications and obtained PO order for K+ instead of IV runs.  Patient states she tries to restrict her fluid intake to 2000 ml a day.  Problem: Education: Goal: Ability to describe self-care measures that may prevent or decrease complications (Diabetes Survival Skills Education) will improve Outcome: Progressing   Problem: Coping: Goal: Ability to adjust to condition or change in health will improve Outcome: Progressing   Problem: Fluid Volume: Goal: Ability to maintain a balanced intake and output will improve Outcome: Progressing

## 2021-10-27 NOTE — Progress Notes (Signed)
eLink Physician-Brief Progress Note Patient Name: Nancy [name] DOB: 06-Nov-2000 MRN: [id number]   Date of Service  10/27/2021  HPI/Events of Note  30 F history of seizure disorder, DM, HF and hyperthyroidism presented with nausea, vomiting abdominal pain and headache. Also had multiple seizures prior to presentation. Previous admission for thyroid storm. She reports compliance with medications.  Unable to see patient due to camera issues. But patient is reportedly alert and oriented x 4 and is hemodynamically stable  eICU Interventions  Hyperthyroidism Tapazole resumed. Also on PTU, Inderal, dexamethasone DM on SSI, Monitor glucose closely while on systemic steroids Seizures no recurrence on Lamictal     Intervention Category Evaluation Type: New Patient Evaluation  Judd Lien 10/27/2021, 9:32 PM

## 2021-10-28 ENCOUNTER — Encounter: Payer: Self-pay | Admitting: Internal Medicine

## 2021-10-28 ENCOUNTER — Inpatient Hospital Stay: Payer: Medicaid Other

## 2021-10-28 DIAGNOSIS — G40909 Epilepsy, unspecified, not intractable, without status epilepticus: Secondary | ICD-10-CM

## 2021-10-28 DIAGNOSIS — E0501 Thyrotoxicosis with diffuse goiter with thyrotoxic crisis or storm: Secondary | ICD-10-CM

## 2021-10-28 DIAGNOSIS — I152 Hypertension secondary to endocrine disorders: Secondary | ICD-10-CM

## 2021-10-28 DIAGNOSIS — E0591 Thyrotoxicosis, unspecified with thyrotoxic crisis or storm: Secondary | ICD-10-CM | POA: Diagnosis not present

## 2021-10-28 DIAGNOSIS — E1159 Type 2 diabetes mellitus with other circulatory complications: Secondary | ICD-10-CM

## 2021-10-28 LAB — CBC
HCT: 27.6 % — ABNORMAL LOW (ref 36.0–46.0)
Hemoglobin: 8.8 g/dL — ABNORMAL LOW (ref 12.0–15.0)
MCH: 23.8 pg — ABNORMAL LOW (ref 26.0–34.0)
MCHC: 31.9 g/dL (ref 30.0–36.0)
MCV: 74.6 fL — ABNORMAL LOW (ref 80.0–100.0)
Platelets: 171 10*3/uL (ref 150–400)
RBC: 3.7 MIL/uL — ABNORMAL LOW (ref 3.87–5.11)
RDW: 15.9 % — ABNORMAL HIGH (ref 11.5–15.5)
WBC: 6.3 10*3/uL (ref 4.0–10.5)
nRBC: 0 % (ref 0.0–0.2)

## 2021-10-28 LAB — BRAIN NATRIURETIC PEPTIDE: B Natriuretic Peptide: 532.5 pg/mL — ABNORMAL HIGH (ref 0.0–100.0)

## 2021-10-28 LAB — GLUCOSE, CAPILLARY
Glucose-Capillary: 347 mg/dL — ABNORMAL HIGH (ref 70–99)
Glucose-Capillary: 249 mg/dL — ABNORMAL HIGH (ref 70–99)

## 2021-10-28 LAB — PHOSPHORUS: Phosphorus: 4.8 mg/dL — ABNORMAL HIGH (ref 2.5–4.6)

## 2021-10-28 LAB — HEMOGLOBIN A1C
Hgb A1c MFr Bld: 6.3 % — ABNORMAL HIGH (ref 4.8–5.6)
Mean Plasma Glucose: 134.11 mg/dL

## 2021-10-28 LAB — BASIC METABOLIC PANEL
Anion gap: 9 (ref 5–15)
BUN: 22 mg/dL — ABNORMAL HIGH (ref 6–20)
CO2: 22 mmol/L (ref 22–32)
Calcium: 9.6 mg/dL (ref 8.9–10.3)
Chloride: 105 mmol/L (ref 98–111)
Creatinine, Ser: 0.74 mg/dL (ref 0.44–1.00)
GFR, Estimated: >60 mL/min (ref >60)
Glucose, Bld: 362 mg/dL — ABNORMAL HIGH (ref 70–99)
Potassium: 3.7 mmol/L (ref 3.5–5.1)
Sodium: 136 mmol/L (ref 135–145)

## 2021-10-28 LAB — MAGNESIUM: Magnesium: 1.6 mg/dL — ABNORMAL LOW (ref 1.7–2.4)

## 2021-10-28 LAB — HEPATIC FUNCTION PANEL
ALT: 15 U/L (ref 0–44)
AST: 16 U/L (ref 15–41)
Albumin: 3.6 g/dL (ref 3.5–5.0)
Alkaline Phosphatase: 91 U/L (ref 38–126)
Bilirubin, Direct: 0.1 mg/dL (ref 0.0–0.2)
Indirect Bilirubin: 0.5 mg/dL (ref 0.3–0.9)
Total Bilirubin: 0.6 mg/dL (ref 0.3–1.2)
Total Protein: 6.5 g/dL (ref 6.5–8.1)

## 2021-10-28 MED ORDER — METHIMAZOLE 10 MG PO TABS: 20.0000 mg | ORAL_TABLET | Freq: Four times a day (QID) | ORAL | Status: DC

## 2021-10-28 MED ORDER — LORAZEPAM 2 MG/ML IJ SOLN: 2.0000 mg | INTRAMUSCULAR | Status: DC | PRN

## 2021-10-28 MED ORDER — INSULIN GLARGINE-YFGN 100 UNIT/ML ~~LOC~~ SOLN
20.0000 [IU] | Freq: Two times a day (BID) | SUBCUTANEOUS | Status: DC
  Filled 2021-10-28: qty 0.2

## 2021-10-28 MED ORDER — PROPRANOLOL HCL 20 MG PO TABS: 80.0000 mg | ORAL_TABLET | Freq: Four times a day (QID) | ORAL | Status: DC

## 2021-10-28 MED ORDER — IODINE STRONG (LUGOLS) 5 % PO SOLN
0.2000 mL | Freq: Three times a day (TID) | ORAL | Status: DC
  Filled 2021-10-28: qty 0.2

## 2021-10-28 MED ORDER — MAGNESIUM SULFATE 2 GM/50ML IV SOLN
2.0000 g | Freq: Once | INTRAVENOUS | Status: AC
  Administered 2021-10-28: 2 g via INTRAVENOUS
  Filled 2021-10-28: qty 50

## 2021-10-28 MED ORDER — OXYCODONE-ACETAMINOPHEN 5-325 MG PO TABS: 1.0000 | ORAL_TABLET | Freq: Four times a day (QID) | ORAL | Status: DC | PRN

## 2021-10-28 NOTE — Progress Notes (Signed)
Progress Notes   Notified by RN pt stating she is going to leave AMA because she does not want to miss a job interview today at 11:00 am.  I spoke with Ms. Codd and informed her if she leaves against medical advice she is at risk for Recurrent Seizure Activity and SUDDEN DEATH due to current condition, which she requires continued treatment/hospitalization.  I also spoke with her job Human resources officer regarding via telephone with Ms. Bible permission to inform her Ms. Sposito is hospitalized.  Pts job recruiter stated she would attempt to reschedule the job interview, but there are other applicants.  Ms. Marte stated she understands the risk and is leaving [place]. She informed me she will return to [facility] ER later today or tomorrow for treatment.    Donell Beers, [place] Pager [phone] (please enter 7 digits) PCCM Consult Pager [phone] (please enter 7 digits) a

## 2021-10-28 NOTE — Progress Notes (Signed)
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   ED Course: Initial vital signs showed HR of 135 beats/minute, BP 193/105  mm Hg, the RR 24 breaths/minute, and the oxygen saturation 100 % on RA and a temperature of 98.14F (37C).   Pertinent Labs/Diagnostics Findings: Chemistry:Na+/ K+: 140/3.2 Glucose:141  CBC: WBC: 8.1 Hgb/Hct: 10.5/32.9  Other Lab findings: TSH <0.01, Free T3 pending, Free T4 >5.50, BNP:  715 Imaging: CXR. No acute cardiopulmonary process Medications received in the ED:PTU, steroids, propranolol, Dilaudid and gentle fluid   PCCM consulted for admission and further management. Past Medical History    Acanthosis nigricans     Anxiety     CHF (congestive heart failure) (HCC)     Chronic lower back pain     Depression     DKA, type 1 (HCC) 09/13/2018   Dyspepsia     Obesity     Ovarian cyst      pt is not aware of this hx (11/24/2017)   Precocious adrenarche (HCC)     Premature baby     Seizures (HCC)     Type II diabetes mellitus (HCC)     Significant Hospital Events   10/22:Admit with breakthrough seizures in the setting of hyperthyroid state 10/23: EEG pending  Consults:  None  Procedures:  EEG 10/23:   Significant Diagnostic Tests:  10/22:Chest Xray>No acute cardiopulmonary process  Micro Data:  MRSA PCR 10/22: negative   Antimicrobials:  None  OBJECTIVE  Blood pressure (!) 110/55, pulse (!) 109, temperature 99.3 F (37.4 C), temperature source Oral, resp. rate (!) 25, height 5\' 2"  (1.575 m),  weight 73.8 kg, SpO2 100 %.        Intake/Output Summary (Last 24 hours) at 10/28/2021 0743 Last data filed at 10/28/2021 0742 Gross per 24 hour  Intake 766.39 ml  Output --  Net 766.39 ml   Filed Weights   10/27/21 1516 10/27/21 2330 10/28/21 0334  Weight: 64.8 kg 64.8 kg 73.8 kg   Physical Examination  GENERAL:21 year-old critically ill patient lying in the bed, NAD  HEENT: Head atraumatic, normocephalic. Oropharynx and nasopharynx clear.  LUNGS: Clear throughout, even, non labored  CARDIOVASCULAR: Sinus tachycardia, s1s2, no r/g, 2+ radial/2+ distal pulses, no edema  ABDOMEN: Soft, nontender, nondistended. Bowel sounds present. No organomegaly or mass.  EXTREMITIES: Normal bulk and tone, moves all extremities NEUROLOGIC:Alert and oriented, following commands, no neurological deficits, PERRLA  PSYCHIATRIC: Appropriate mood and affect. The patient is  SKIN: No obvious rash, lesion, or ulcer.   Labs/imaging that I havepersonally reviewed  (right click and "Reselect all SmartList Selections" daily)     Labs   CBC: Recent Labs  Lab 10/27/21 1530 10/28/21 0413  WBC 8.1 6.3  HGB 10.5* 8.8*  HCT 32.9* 27.6*  MCV 73.6* 74.6*  PLT 183 171    Basic Metabolic Panel: Recent Labs  Lab 10/27/21 1530 10/28/21 0413  NA 140 136  K 3.2* 3.7  CL 104 105  CO2 23 22  GLUCOSE 141* 362*  BUN 13 22*  CREATININE 0.37*  0.74  CALCIUM 10.8* 9.6  MG  --  1.6*  PHOS  --  4.8*   GFR: Estimated Creatinine Clearance: 102.9 mL/min (by C-G formula based on SCr of 0.74 mg/dL). Recent Labs  Lab 10/27/21 1530 10/27/21 1720 10/27/21 2045 10/28/21 0413  WBC 8.1  --   --  6.3  LATICACIDVEN  --  1.5 1.6  --     Liver Function Tests: Recent Labs  Lab 10/27/21 1720  AST 25  ALT 19  ALKPHOS 104  BILITOT 1.6*  PROT 7.2  ALBUMIN 3.8   Recent Labs  Lab 10/27/21 1720  LIPASE 27   No results for input(s): "AMMONIA" in the last 168 hours.  ABG    Component Value Date/Time    PHART 7.402 12/01/2018 0120   PCO2ART 34.2 12/01/2018 0120   PO2ART 84.0 12/01/2018 0120   HCO3 28.4 (H) 06/28/2021 1117   TCO2 29 04/10/2020 1105   ACIDBASEDEF 4.4 (H) 08/06/2019 1335   O2SAT 91.4 06/28/2021 1117     Coagulation Profile: No results for input(s): "INR", "PROTIME" in the last 168 hours.  Cardiac Enzymes: No results for input(s): "CKTOTAL", "CKMB", "CKMBINDEX", "TROPONINI" in the last 168 hours.  HbA1C: Hgb A1c MFr Bld  Date/Time Value Ref Range Status  08/21/2021 09:32 AM 6.4 (H) 4.8 - 5.6 % Final    Comment:    (NOTE) Pre diabetes:          5.7%-6.4%  Diabetes:              >6.4%  Glycemic control for   <7.0% adults with diabetes   02/13/2021 03:28 AM 6.7 (H) 4.8 - 5.6 % Final    Comment:    (NOTE) Pre diabetes:          5.7%-6.4%  Diabetes:              >6.4%  Glycemic control for   <7.0% adults with diabetes     CBG: Recent Labs  Lab 10/27/21 2020 10/27/21 2112 10/27/21 2302 10/28/21 0305 10/28/21 0723  GLUCAP 149* 147* 194* 347* 249*    Review of Systems: Positives in BOLD   Gen: Denies fever, chills, weight change, fatigue, night sweats HEENT: Denies blurred vision, double vision, hearing loss, tinnitus, sinus congestion, rhinorrhea, sore throat, neck stiffness, dysphagia PULM: Denies shortness of breath, cough, sputum production, hemoptysis, wheezing CV: Denies chest pain, edema, orthopnea, paroxysmal nocturnal dyspnea, palpitations GI: abdominal pain, nausea, vomiting, diarrhea, hematochezia, melena, constipation, change in bowel habits GU: Denies dysuria, hematuria, polyuria, oliguria, urethral discharge Endocrine: Denies hot or cold intolerance, polyuria, polyphagia or appetite change Derm: Denies rash, dry skin, scaling or peeling skin change Heme: Denies easy bruising, bleeding, bleeding gums Neuro: Denies headache, numbness, weakness, slurred speech, loss of memory or consciousness  Past Medical History  She,  has a past  medical history of Acanthosis nigricans, Anxiety, CHF (congestive heart failure) (HCC), Chronic lower back pain, Depression, DKA, type 1 (HCC) (09/13/2018), Dyspepsia, Obesity, Ovarian cyst, Precocious adrenarche (HCC), Premature baby, Seizures (HCC), and Type II diabetes mellitus (HCC).   Surgical History    Past Surgical History:  Procedure Laterality Date   ABDOMINAL HERNIA REPAIR     "I was a baby"   BIOPSY  10/12/2018   Procedure: BIOPSY;  Surgeon: Lynann Bologna, MD;  [facility] ENDOSCOPY;  Service: Endoscopy;;   BIOPSY  02/28/2020   Procedure: BIOPSY;  Surgeon: Shellia Cleverly, DO;  Location: [facility] ENDOSCOPY;  Service: Gastroenterology;;   ESOPHAGOGASTRODUODENOSCOPY (EGD) WITH  PROPOFOL N/A 10/12/2018   Procedure: ESOPHAGOGASTRODUODENOSCOPY (EGD) WITH PROPOFOL;  Surgeon: Lynann Bologna, MD;  Location[facility] ENDOSCOPY;  Service: Endoscopy;  Laterality: N/A;   ESOPHAGOGASTRODUODENOSCOPY (EGD) WITH PROPOFOL N/A 02/28/2020   Procedure: ESOPHAGOGASTRODUODENOSCOPY (EGD) WITH PROPOFOL;  Surgeon: Shellia Cleverly, DO;  Location: [facility] ENDOSCOPY;  Service: Gastroenterology;  Laterality: N/A;   FLEXIBLE SIGMOIDOSCOPY N/A 02/28/2020   Procedure: FLEXIBLE SIGMOIDOSCOPY;  Surgeon: Shellia Cleverly, DO;  Location: [facility] ENDOSCOPY;  Service: Gastroenterology;  Laterality: N/A;   HERNIA REPAIR     LEFT HEART CATH AND CORONARY ANGIOGRAPHY N/A 10/13/2018   Procedure: LEFT HEART CATH AND CORONARY ANGIOGRAPHY;  Surgeon: Kathleene Hazel, MD;  Location: [facility] INVASIVE CV LAB;  Service: Cardiovascular;  Laterality: N/A;   TONSILLECTOMY AND ADENOIDECTOMY     WISDOM TOOTH EXTRACTION  2017     Social History   reports that she has never smoked. She has never used smokeless tobacco. She reports that she does not drink alcohol and does not use drugs.   Family History   Her family history includes Allergic rhinitis in her mother; Asthma in her mother; Cervical cancer in her mother; Colon cancer in her maternal  grandfather; Diabetes in her father, mother, paternal grandfather, and paternal grandmother; Eczema in her mother; Hyperlipidemia in her father; Hypertension in her father, maternal grandfather, mother, and paternal aunt; Obesity in her father, mother, paternal grandfather, and paternal grandmother. There is no history of Angioedema, Immunodeficiency, Urticaria, Stomach cancer, or Esophageal cancer.   Allergies Allergies  Allergen Reactions   Oatmeal Anaphylaxis   Tomato Anaphylaxis   Ibuprofen Other (See Comments)    GI MD said to not take this anymore     Home Medications  Prior to Admission medications   Medication Sig Start Date End Date Taking? Authorizing Provider  FLUoxetine (PROZAC) 20 MG tablet Take 20 mg by mouth daily.   Yes [provider]  furosemide (LASIX) 20 MG tablet Take 20 mg by mouth daily as needed. 09/04/21  Yes [provider]  insulin glargine (LANTUS) 100 unit/mL SOPN Inject 20 Units into the skin 2 (two) times daily. Patient taking differently: Inject 60 Units into the skin 2 (two) times daily. 08/23/21  Yes Wouk, Wilfred Curtis, MD  lamoTRIgine (LAMICTAL) 100 MG tablet Take 1 tablet (100 mg total) by mouth in the morning, at noon, and at bedtime. 10/05/21 01/03/22 Yes Darlin Priestly, MD  loperamide (IMODIUM) 2 MG capsule Take 2 capsules (4 mg total) by mouth as needed for diarrhea or loose stools. 08/23/21  Yes Wouk, Wilfred Curtis, MD  lubiprostone (AMITIZA) 8 MCG capsule Take 8 mcg by mouth 2 (two) times daily with a meal.   Yes [provider]  methimazole (TAPAZOLE) 10 MG tablet Take 2 tablets (20 mg total) by mouth 2 (two) times daily. 10/05/21 01/03/22 Yes Darlin Priestly, MD  metoCLOPramide (REGLAN) 10 MG tablet Take 1 tablet (10 mg total) by mouth 3 (three) times daily with meals. 05/07/21 05/07/22 Yes Loleta [name], MD  ondansetron (ZOFRAN-ODT) 4 MG disintegrating tablet Take 1 tablet (4 mg total) by mouth every 8 (eight) hours as needed. 07/16/21  Yes  Delton Prairie, MD  propranolol (INDERAL) 80 MG tablet Take 1 tablet (80 mg total) by mouth 3 (three) times daily. 10/05/21 01/03/22 Yes Darlin Priestly, MD  QUEtiapine (SEROQUEL) 50 MG tablet Take 50 mg by mouth 2 (two) times daily.   Yes [provider]  Continuous Blood Gluc Sensor (FREESTYLE LIBRE 2 SENSOR) MISC Use as directed  08/23/21   Wouk, Wilfred Curtis, MD  Glucagon (BAQSIMI TWO PACK) 3 MG/DOSE POWD Place 1 spray into the nose as needed. 08/23/21   Wouk, Wilfred Curtis, MD  losartan (COZAAR) 25 MG tablet Take 1 tablet (25 mg total) by mouth daily. Patient not taking: Reported on 10/27/2021 08/23/21   Wouk, Wilfred Curtis, MD  promethazine (PHENERGAN) 25 MG suppository Place 1 suppository (25 mg total) rectally every 8 (eight) hours as needed for nausea or vomiting. 06/30/21   Phineas Semen, MD  prochlorperazine (COMPAZINE) 25 MG suppository PLACE 1 SUPPOSITORY (25 MG TOTAL) RECTALLY EVERY TWELVE HOURS AS NEEDED FOR NAUSEA OR VOMITING. 02/29/20 02/29/20  Ghimire, Werner Lean, MD  Scheduled Meds:  Chlorhexidine Gluconate Cloth  6 each Topical Daily   dexamethasone (DECADRON) injection  4 mg Intravenous Q8H   enoxaparin (LOVENOX) injection  40 mg Subcutaneous Q24H   insulin aspart  0-20 Units Subcutaneous Q4H   insulin glargine-yfgn  20 Units Subcutaneous BID   Iodine Strong (Lugols)  0.2 mL Oral TID   lamoTRIgine  100 mg Oral TID   losartan  25 mg Oral Daily   methimazole  20 mg Oral TID   metoCLOPramide  10 mg Oral TID WC   propranolol  80 mg Oral TID   QUEtiapine  300 mg Oral QHS   Continuous Infusions: PRN Meds:.docusate sodium, furosemide, LORazepam, mouth rinse, polyethylene glycol   Active Hospital Problem list     Assessment & Plan:  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 These findings are nonspecific but could  be seen with thyroiditis or autoimmune thyroid disorder.   08/21/21: Thyroid antibodies elevated  - She received PTU 600 mg in ER.  - Continue home Methimazole 20 mg Q4hr - Increase propanolol frequency to 80 mg q6hrs  - Continue Lugol's iodine solution 0.45mL tid - Continue decadron 4mg  q8hrs  - Increase methimazole frequency 20 mg q6hrs  - Hepatic function panel and CBC daily  - BNP pending  - Pt has an initial endocrinology appointment scheduled in Dec 2023   Seizure Disorder - Likely breakthrough seizures secondary to medication noncompliance  - Check Lamictal levels - Continue outpatient lamictal  - EEG pending  - Seizure precautions - Ativan PRN for seizure breathrough   Chronic HFpEF (recent Echo with EF of 08/2021 60 to 65%) BNP~slightly elevated at 715 although does not appear to be volume overloaded - Continuous cardiac monitoring - Maintain MAP greater than 65 - IV Lasix as blood pressure and renal function permits; currently on Lasix 20 mg prn daily - Continue Losartan   T2DM~insulin dependant  HgbA1c pending  - CBG's Q4Hr; Target range of 140 to 180 - SSI - Follow ICU Hypo/Hyperglycemia protocol - Diabetes coordinator consulted appreciate input   Abdominal Pain  Diarrhea   KUB 10/23: negative  - Prn percocet for pain management  - Will send off GI panel   Mood Disorder Hx Anxiety and Depression, Insomnia - Continue outpatient seroquel and prozac  Best practice:  Diet:  Oral Pain/Anxiety/Delirium protocol (if indicated): No VAP protocol (if indicated): Not indicated DVT prophylaxis: LMWH GI prophylaxis: N/A Glucose control:  SSI Yes Central venous access:  N/A Arterial line:  N/A Foley:  N/A Mobility:  bed rest PT consulted: N/A Last date of multidisciplinary goals of care discussion  [10/28/21] Code Status:  full code Disposition: ICU  Critical care time:  40 minutes      Donell Beers, [place] Pager [phone] (please  enter 7 digits) PCCM Consult Pager [phone] (please enter 7 digits)

## 2021-10-28 NOTE — Progress Notes (Signed)
Patient provided AMA form and signed with this RN as witness. Form placed in physical chart. PIV removed and guaze applied. Patient removed from telemetry monitor. All belongings returned. Patient ambulated to medical mall entrance.

## 2021-10-28 NOTE — Inpatient Diabetes Management (Signed)
Inpatient Diabetes Program Recommendations  AACE/ADA: New Consensus Statement on Inpatient Glycemic Control   Target Ranges:  Prepandial:   less than 140 mg/dL      Peak postprandial:   less than 180 mg/dL (1-2 hours)      Critically ill patients:  140 - 180 mg/dL    Latest Reference Range & Units 10/28/21 03:05 10/28/21 07:23  Glucose-Capillary 70 - 99 mg/dL 347 (H) 249 (H)    Latest Reference Range & Units 10/27/21 20:20 10/27/21 21:12 10/27/21 23:02  Glucose-Capillary 70 - 99 mg/dL 149 (H) 147 (H) 194 (H)   Review of Glycemic Control  Diabetes history: DM2 Outpatient Diabetes medications: Lantus 60 units BID Current orders for Inpatient glycemic control: Semglee 20 units BID, Novolog 0-20 units Q4H; Decadron 4 mg Q8H  Inpatient Diabetes Program Recommendations:    Insulin: Agree with current insulin orders.  NOTE: Noted consult for diabetes coordinator. Patient known to inpatient diabetes team due to frequent hospital admissions and ED visits. Inpatient diabetes coordinator spoke with patient on 10/04/21 during last admission (10/03/21-10/05/21).  Patient admitted with thyroid storm; currently ordered Decadron which is contributing to hyperglycemia. Semglee just ordered this morning to start at 10:00 am. Will continue to follow along and make further recommendations if needed.  Thanks, Barnie Alderman, RN, MSN, [place] Diabetes Coordinator Inpatient Diabetes Program [phone] (Team Pager from 8am to [place])

## 2021-10-29 LAB — LAMOTRIGINE LEVEL
Lamotrigine Lvl: <1 ug/mL — ABNORMAL LOW (ref 2.0–20.0)
Lamotrigine Lvl: 1.4 ug/mL — ABNORMAL LOW (ref 2.0–20.0)

## 2021-10-29 LAB — T3, FREE: T3, Free: 31.3 pg/mL — ABNORMAL HIGH (ref 2.0–4.4)

## 2021-10-31 IMAGING — CR DG CHEST 2V
1 series · 2 of 2 positions shown · non-contrast
Comparison: 04/15/2020

CLINICAL DATA: Chest pain.

EXAM:
CHEST - 2 VIEW

[Series 1: w chest lat · 0.14mm/px · 2 of 2 slices shown]
[im 1/2]
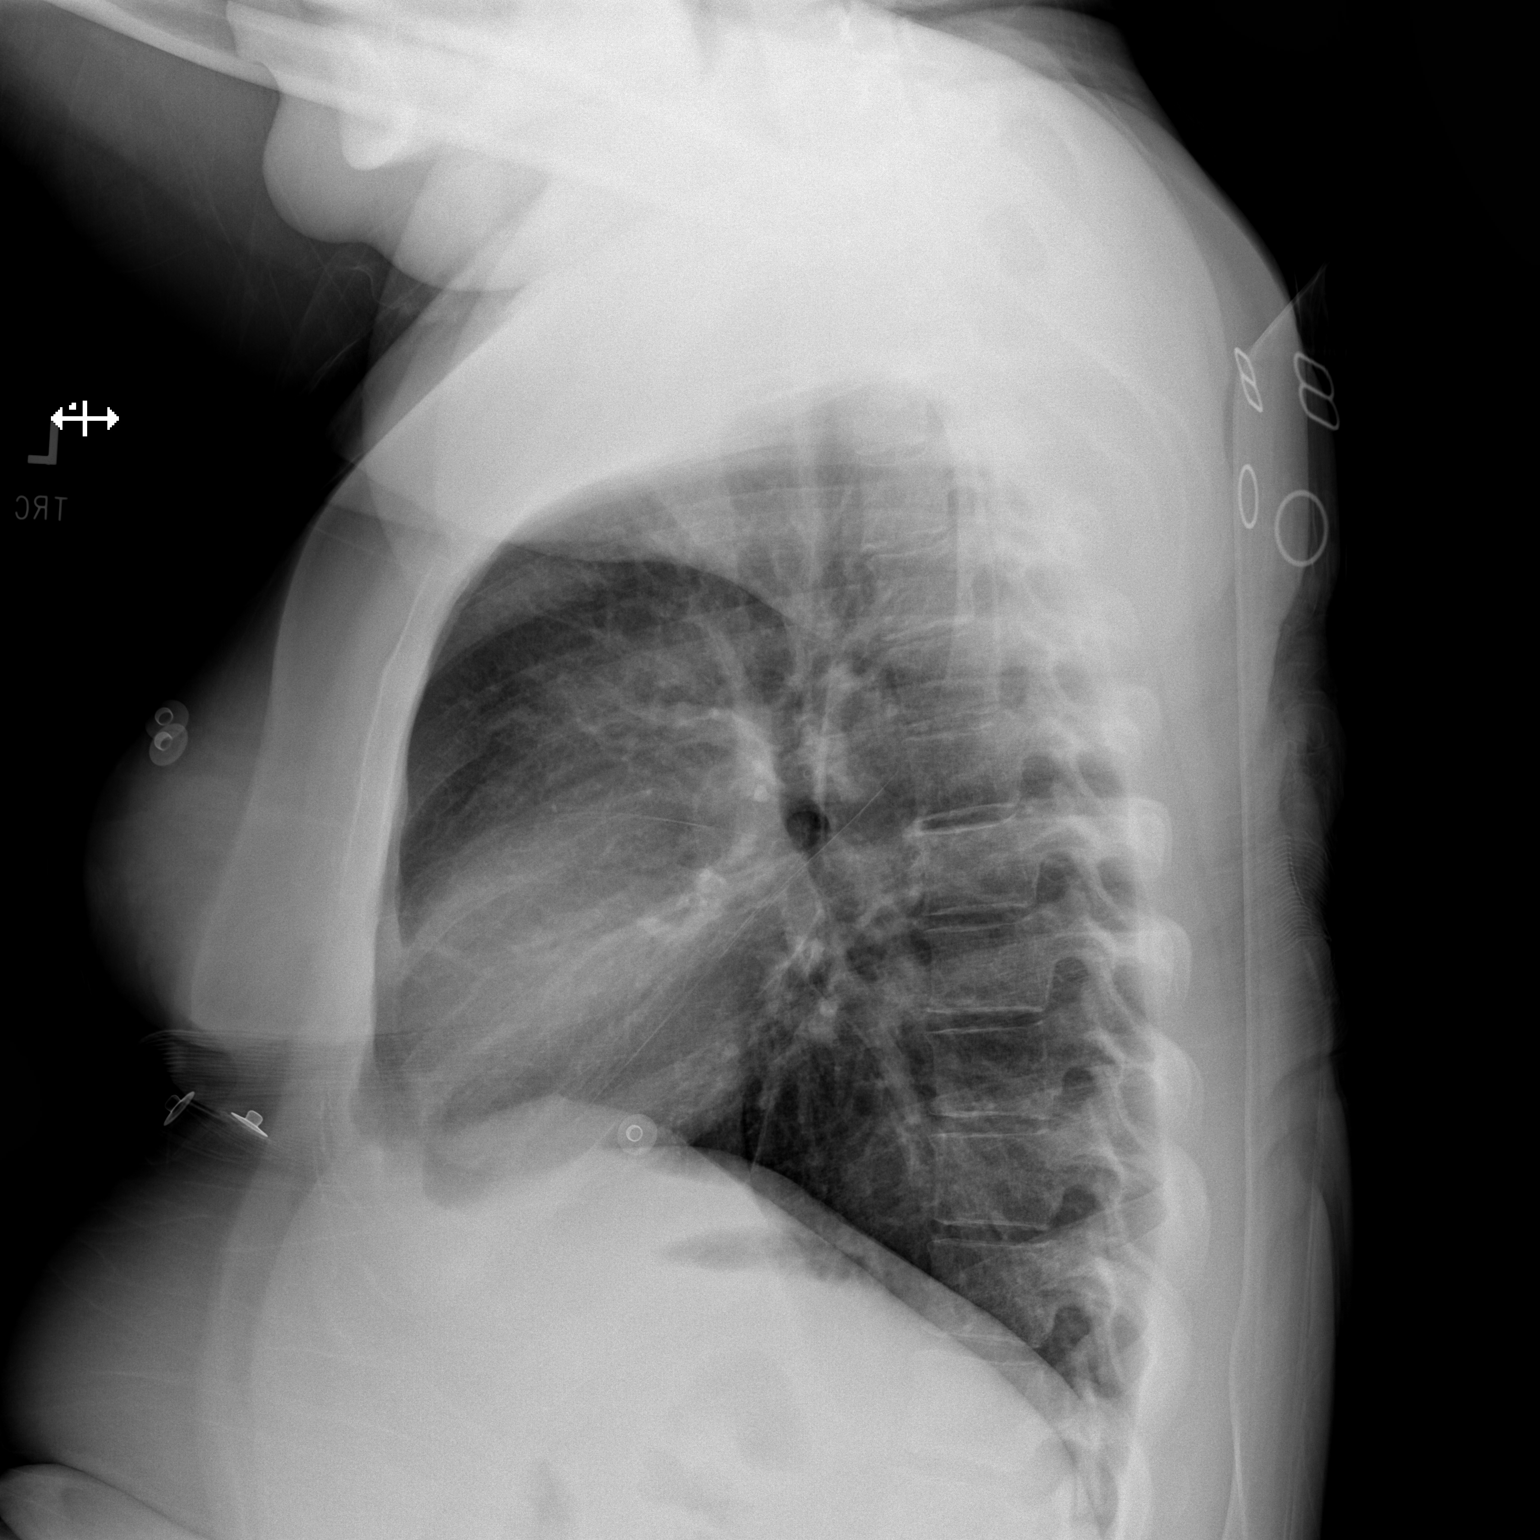
[im 2/2]
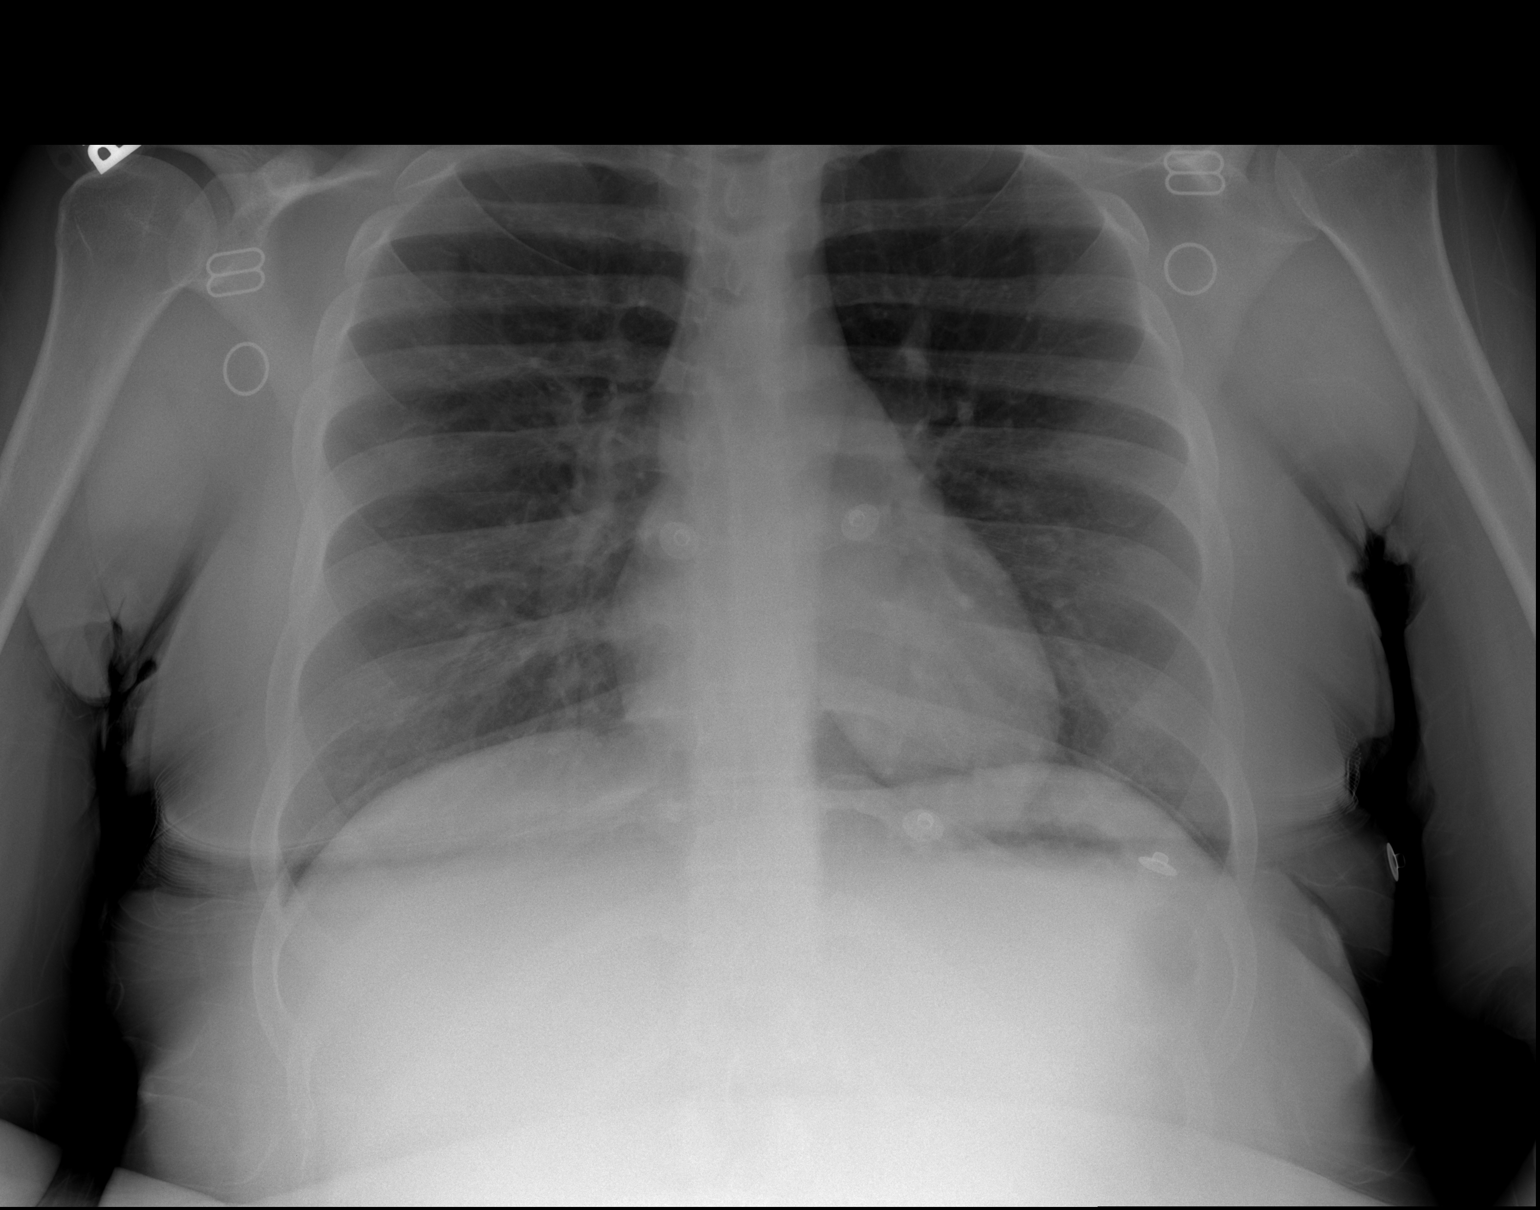

[2 of 2 positions shown; findings below may reference images not displayed]

FINDINGS: The cardiac silhouette, mediastinal and hilar contours are normal.
The lungs are clear. No pleural effusions. No pulmonary lesions. The
bony thorax is intact.
IMPRESSION: No acute cardiopulmonary findings.

## 2021-11-15 ENCOUNTER — Encounter: Payer: Self-pay | Admitting: Cardiology

## 2021-11-15 ENCOUNTER — Ambulatory Visit: Payer: Medicaid Other | Attending: Cardiology | Admitting: Cardiology

## 2021-12-31 ENCOUNTER — Emergency Department: Payer: Medicaid Other

## 2021-12-31 ENCOUNTER — Observation Stay
Admission: EM | Admit: 2021-12-31 | Discharge: 2022-01-01 | Discharge status: Against Medical Advice | Payer: Medicaid Other | Attending: Internal Medicine | Admitting: Internal Medicine

## 2021-12-31 ENCOUNTER — Other Ambulatory Visit: Payer: Self-pay

## 2021-12-31 DIAGNOSIS — E1040 Type 1 diabetes mellitus with diabetic neuropathy, unspecified: Secondary | ICD-10-CM | POA: Insufficient documentation

## 2021-12-31 DIAGNOSIS — E039 Hypothyroidism, unspecified: Secondary | ICD-10-CM | POA: Insufficient documentation

## 2021-12-31 DIAGNOSIS — E876 Hypokalemia: Secondary | ICD-10-CM | POA: Diagnosis not present

## 2021-12-31 DIAGNOSIS — E0521 Thyrotoxicosis with toxic multinodular goiter with thyrotoxic crisis or storm: Secondary | ICD-10-CM | POA: Diagnosis not present

## 2021-12-31 DIAGNOSIS — R569 Unspecified convulsions: Secondary | ICD-10-CM

## 2021-12-31 DIAGNOSIS — D509 Iron deficiency anemia, unspecified: Secondary | ICD-10-CM | POA: Diagnosis not present

## 2021-12-31 DIAGNOSIS — Z79899 Other long term (current) drug therapy: Secondary | ICD-10-CM | POA: Insufficient documentation

## 2021-12-31 DIAGNOSIS — E0590 Thyrotoxicosis, unspecified without thyrotoxic crisis or storm: Principal | ICD-10-CM | POA: Diagnosis present

## 2021-12-31 DIAGNOSIS — I509 Heart failure, unspecified: Secondary | ICD-10-CM | POA: Diagnosis not present

## 2021-12-31 DIAGNOSIS — G40909 Epilepsy, unspecified, not intractable, without status epilepticus: Secondary | ICD-10-CM | POA: Diagnosis present

## 2021-12-31 DIAGNOSIS — Z794 Long term (current) use of insulin: Secondary | ICD-10-CM | POA: Diagnosis not present

## 2021-12-31 LAB — CBC WITH DIFFERENTIAL/PLATELET
Abs Immature Granulocytes: 0.01 10*3/uL (ref 0.00–0.07)
Basophils Absolute: 0 10*3/uL (ref 0.0–0.1)
Basophils Relative: 0 %
Eosinophils Absolute: 0 10*3/uL (ref 0.0–0.5)
Eosinophils Relative: 0 %
HCT: 33.9 % — ABNORMAL LOW (ref 36.0–46.0)
Hemoglobin: 10.8 g/dL — ABNORMAL LOW (ref 12.0–15.0)
Immature Granulocytes: 0 %
Lymphocytes Relative: 20 %
Lymphs Abs: 1 10*3/uL (ref 0.7–4.0)
MCH: 23.6 pg — ABNORMAL LOW (ref 26.0–34.0)
MCHC: 31.9 g/dL (ref 30.0–36.0)
MCV: 74 fL — ABNORMAL LOW (ref 80.0–100.0)
Monocytes Absolute: 1 10*3/uL (ref 0.1–1.0)
Monocytes Relative: 21 %
Neutro Abs: 2.8 10*3/uL (ref 1.7–7.7)
Neutrophils Relative %: 59 %
Platelets: 206 10*3/uL (ref 150–400)
RBC: 4.58 MIL/uL (ref 3.87–5.11)
RDW: 13.5 % (ref 11.5–15.5)
WBC: 4.8 10*3/uL (ref 4.0–10.5)
nRBC: 0 % (ref 0.0–0.2)

## 2021-12-31 LAB — COMPREHENSIVE METABOLIC PANEL
ALT: 52 U/L — ABNORMAL HIGH (ref 0–44)
AST: 87 U/L — ABNORMAL HIGH (ref 15–41)
Albumin: 4 g/dL (ref 3.5–5.0)
Alkaline Phosphatase: 114 U/L (ref 38–126)
Anion gap: 11 (ref 5–15)
BUN: 11 mg/dL (ref 6–20)
CO2: 22 mmol/L (ref 22–32)
Calcium: 9.3 mg/dL (ref 8.9–10.3)
Chloride: 102 mmol/L (ref 98–111)
Creatinine, Ser: 0.34 mg/dL — ABNORMAL LOW (ref 0.44–1.00)
GFR, Estimated: >60 mL/min (ref >60)
Glucose, Bld: 122 mg/dL — ABNORMAL HIGH (ref 70–99)
Potassium: 3.3 mmol/L — ABNORMAL LOW (ref 3.5–5.1)
Sodium: 135 mmol/L (ref 135–145)
Total Bilirubin: 1.6 mg/dL — ABNORMAL HIGH (ref 0.3–1.2)
Total Protein: 7 g/dL (ref 6.5–8.1)

## 2021-12-31 LAB — T4, FREE: Free T4: >5.5 ng/dL — ABNORMAL HIGH (ref 0.61–1.12)

## 2021-12-31 LAB — PROCALCITONIN: Procalcitonin: <0.1 ng/mL

## 2021-12-31 LAB — TSH: TSH: <0.01 u[IU]/mL — ABNORMAL LOW (ref 0.350–4.500)

## 2021-12-31 LAB — TROPONIN I (HIGH SENSITIVITY)
Troponin I (High Sensitivity): 10 ng/L (ref <18)
Troponin I (High Sensitivity): 8 ng/L (ref <18)

## 2021-12-31 MED ORDER — PROPRANOLOL HCL 20 MG PO TABS: 80.0000 mg | ORAL_TABLET | Freq: Three times a day (TID) | ORAL | Status: DC

## 2021-12-31 MED ORDER — SODIUM CHLORIDE 0.9 % IV SOLN: INTRAVENOUS | Status: DC

## 2021-12-31 MED ORDER — LAMOTRIGINE 100 MG PO TABS
200.0000 mg | ORAL_TABLET | Freq: Once | ORAL | Status: AC
  Administered 2021-12-31: 200 mg via ORAL
  Filled 2021-12-31: qty 2

## 2021-12-31 MED ORDER — TRAMADOL HCL 50 MG PO TABS
50.0000 mg | ORAL_TABLET | Freq: Three times a day (TID) | ORAL | Status: DC | PRN
  Administered 2022-01-01: 50 mg via ORAL
  Filled 2021-12-31: qty 1

## 2021-12-31 MED ORDER — ALBUTEROL SULFATE (2.5 MG/3ML) 0.083% IN NEBU: 2.5000 mg | INHALATION_SOLUTION | RESPIRATORY_TRACT | Status: DC | PRN

## 2021-12-31 MED ORDER — ONDANSETRON HCL 4 MG PO TABS: 4.0000 mg | ORAL_TABLET | Freq: Four times a day (QID) | ORAL | Status: DC | PRN

## 2021-12-31 MED ORDER — SODIUM CHLORIDE 0.9 % IV BOLUS: 1000.0000 mL | Freq: Once | INTRAVENOUS | Status: DC

## 2021-12-31 MED ORDER — ACETAMINOPHEN 325 MG PO TABS
650.0000 mg | ORAL_TABLET | Freq: Once | ORAL | Status: DC
  Filled 2021-12-31: qty 2

## 2021-12-31 MED ORDER — METHIMAZOLE 10 MG PO TABS
20.0000 mg | ORAL_TABLET | Freq: Two times a day (BID) | ORAL | Status: DC
  Administered 2022-01-01 (×2): 20 mg via ORAL
  Filled 2021-12-31 (×3): qty 2

## 2021-12-31 MED ORDER — IODINE STRONG (LUGOLS) 5 % PO SOLN
0.4000 mL | Freq: Four times a day (QID) | ORAL | Status: DC
  Administered 2021-12-31 – 2022-01-01 (×2): 0.4 mL via ORAL
  Filled 2021-12-31 (×4): qty 0.4

## 2021-12-31 MED ORDER — QUETIAPINE FUMARATE 25 MG PO TABS: 100.0000 mg | ORAL_TABLET | Freq: Every day | ORAL | Status: DC

## 2021-12-31 MED ORDER — HYDROCORTISONE SOD SUC (PF) 100 MG IJ SOLR
300.0000 mg | Freq: Once | INTRAMUSCULAR | Status: AC
  Administered 2021-12-31: 300 mg via INTRAVENOUS
  Filled 2021-12-31: qty 6

## 2021-12-31 MED ORDER — LORAZEPAM 2 MG/ML IJ SOLN
1.0000 mg | Freq: Once | INTRAMUSCULAR | Status: AC
  Administered 2021-12-31: 1 mg via INTRAVENOUS
  Filled 2021-12-31: qty 1

## 2021-12-31 MED ORDER — PROPRANOLOL HCL 20 MG PO TABS
60.0000 mg | ORAL_TABLET | Freq: Three times a day (TID) | ORAL | Status: DC
  Administered 2021-12-31 – 2022-01-01 (×3): 60 mg via ORAL
  Filled 2021-12-31 (×3): qty 3

## 2021-12-31 MED ORDER — LOPERAMIDE HCL 2 MG PO CAPS: 4.0000 mg | ORAL_CAPSULE | ORAL | Status: DC | PRN

## 2021-12-31 MED ORDER — QUETIAPINE FUMARATE 25 MG PO TABS
50.0000 mg | ORAL_TABLET | Freq: Two times a day (BID) | ORAL | Status: DC
  Administered 2022-01-01: 50 mg via ORAL
  Filled 2021-12-31: qty 2

## 2021-12-31 MED ORDER — QUETIAPINE FUMARATE 300 MG PO TABS
300.0000 mg | ORAL_TABLET | Freq: Every day | ORAL | Status: DC
  Administered 2022-01-01: 300 mg via ORAL
  Filled 2021-12-31: qty 1

## 2021-12-31 MED ORDER — INSULIN GLARGINE-YFGN 100 UNIT/ML ~~LOC~~ SOLN
20.0000 [IU] | Freq: Two times a day (BID) | SUBCUTANEOUS | Status: DC
  Administered 2022-01-01 (×2): 20 [IU] via SUBCUTANEOUS
  Filled 2021-12-31 (×3): qty 0.2

## 2021-12-31 MED ORDER — ENOXAPARIN SODIUM 40 MG/0.4ML IJ SOSY: 40.0000 mg | PREFILLED_SYRINGE | INTRAMUSCULAR | Status: DC

## 2021-12-31 MED ORDER — LOSARTAN POTASSIUM 50 MG PO TABS
25.0000 mg | ORAL_TABLET | Freq: Every day | ORAL | Status: DC
  Administered 2022-01-01: 25 mg via ORAL
  Filled 2021-12-31: qty 1

## 2021-12-31 MED ORDER — METHIMAZOLE 10 MG PO TABS
40.0000 mg | ORAL_TABLET | Freq: Once | ORAL | Status: AC
  Administered 2021-12-31: 40 mg via ORAL
  Filled 2021-12-31 (×2): qty 4

## 2021-12-31 MED ORDER — FLUOXETINE HCL 20 MG PO CAPS
20.0000 mg | ORAL_CAPSULE | Freq: Every day | ORAL | Status: DC
  Administered 2022-01-01: 20 mg via ORAL
  Filled 2021-12-31: qty 1

## 2021-12-31 MED ORDER — LUBIPROSTONE 8 MCG PO CAPS
8.0000 ug | ORAL_CAPSULE | Freq: Two times a day (BID) | ORAL | Status: DC
  Filled 2021-12-31 (×2): qty 1

## 2021-12-31 MED ORDER — INSULIN ASPART 100 UNIT/ML IJ SOLN
0.0000 [IU] | Freq: Three times a day (TID) | INTRAMUSCULAR | Status: DC
  Administered 2022-01-01: 5 [IU] via SUBCUTANEOUS
  Filled 2021-12-31: qty 1

## 2021-12-31 MED ORDER — SODIUM CHLORIDE 0.9 % IV BOLUS
500.0000 mL | Freq: Once | INTRAVENOUS | Status: AC
  Administered 2021-12-31: 500 mL via INTRAVENOUS

## 2021-12-31 MED ORDER — ONDANSETRON HCL 4 MG/2ML IJ SOLN: 4.0000 mg | Freq: Four times a day (QID) | INTRAMUSCULAR | Status: DC | PRN

## 2021-12-31 MED ORDER — LAMOTRIGINE 100 MG PO TABS
100.0000 mg | ORAL_TABLET | Freq: Two times a day (BID) | ORAL | Status: DC
  Administered 2022-01-01: 100 mg via ORAL
  Filled 2021-12-31: qty 1

## 2021-12-31 NOTE — ED Triage Notes (Signed)
Pt comes via EMs from home with c/o 4-5 seizure over last hour. Pt was taken of keppra 6 months ago. Pt is still on lamictal and did take it today.   No seizure with EMs. No line due to heard stick  Pt is A&OX4 and ambulatory. VSS

## 2021-12-31 NOTE — ED Notes (Signed)
Seizure pads placed

## 2021-12-31 NOTE — H&P (Addendum)
History and Physical    Patient: Nancy [name] [id number] DOB: 2000/03/03 DOA: 12/31/2021 DOS: the patient was seen and examined on 12/31/2021 PCP: Nolene Ebbs, MD  Patient coming from: Home  Chief Complaint:  Chief Complaint  Patient presents with   Seizures   HPI: Nancy [name] is a 21 y.o. female with medical history significant of anxiety disorder, hypothyroidism with admissions in the past for concerns of thyroid storm, insulin-dependent diabetes mellitus, hypertension, seizure disorder on Lamictal.  She presents with 4-day history of abdominal discomfort with associated intractable nausea and vomiting.  Despite treatment with Zofran, symptoms did not abate.  She has since felt unwell.  Today she reports having had 4-5 seizures like activity at home.  She also felt tachycardic.  She attributed her symptoms most likely related to her thyroid disease.  She reports compliance with her methimazole.  She had followed up with [facility] clinic and has an upcoming visit at [facility] endocrinology in the coming weeks.  She denies chest pain or palpitations.  Denies any nausea or vomiting at this time.  She admits to episodes of increased diaphoresis and hot spells.  Denies any tremulousness.  No altered mental status.  She admits that due to had nausea, she has been inconsistent with her medications lately over the past 4 days.  Heart rate on admission has been ranging between 109 and 133.  Blood pressure has 154/79.  Oxygen saturation 99%.  Review of Systems: As mentioned in the history of present illness. All other systems reviewed and are negative. 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[place])    Premature baby    Seizures ([place])     Type II diabetes mellitus ([place])    insulin dependant   Past Surgical History:  Procedure Laterality Date   ABDOMINAL HERNIA REPAIR     "I was a baby"   BIOPSY  10/12/2018   Procedure: BIOPSY;  Surgeon: Jackquline Denmark, MD;  Location: [facility] ENDOSCOPY;  Service: Endoscopy;;   BIOPSY  02/28/2020   Procedure: BIOPSY;  Surgeon: Lavena Bullion, DO;  Location: [place] ENDOSCOPY;  Service: Gastroenterology;;   ESOPHAGOGASTRODUODENOSCOPY (EGD) WITH PROPOFOL N/A 10/12/2018   Procedure: ESOPHAGOGASTRODUODENOSCOPY (EGD) WITH PROPOFOL;  Surgeon: Jackquline Denmark, MD;  Location: [facility] ENDOSCOPY;  Service: Endoscopy;  Laterality: N/A;   ESOPHAGOGASTRODUODENOSCOPY (EGD) WITH PROPOFOL N/A 02/28/2020   Procedure: ESOPHAGOGASTRODUODENOSCOPY (EGD) WITH PROPOFOL;  Surgeon: Lavena Bullion, DO;  Location: [place];  Service: Gastroenterology;  Laterality: N/A;   FLEXIBLE SIGMOIDOSCOPY N/A 02/28/2020   Procedure: FLEXIBLE SIGMOIDOSCOPY;  Surgeon: Lavena Bullion, DO;  Location: [place];  Service: Gastroenterology;  Laterality: N/A;   HERNIA REPAIR     LEFT HEART CATH AND CORONARY ANGIOGRAPHY N/A 10/13/2018   Procedure: LEFT HEART CATH AND CORONARY ANGIOGRAPHY;  Surgeon: Burnell Blanks, MD;  Location: [place] CV LAB;  Service: Cardiovascular;  Laterality: N/A;   TONSILLECTOMY AND ADENOIDECTOMY     WISDOM TOOTH EXTRACTION  2017   Social History:  reports that she has never smoked. She has never used smokeless tobacco. She reports that she does not drink alcohol and does not use drugs.  Allergies  Allergen Reactions   Oatmeal Anaphylaxis   Tomato Anaphylaxis   Acetaminophen Other (See Comments)    Avoids due to liver   Ibuprofen Other (See Comments)    GI MD said to not take this anymore    Family History  Problem Relation Age of Onset   Diabetes Mother    Hypertension Mother    Obesity Mother    Asthma Mother    Allergic rhinitis Mother    Eczema Mother    Cervical cancer Mother     Diabetes Father    Hypertension Father    Obesity Father    Hyperlipidemia Father    Hypertension Paternal Aunt    Hypertension Maternal Grandfather    Colon cancer Maternal Grandfather    Diabetes Paternal Grandmother    Obesity Paternal Grandmother    Diabetes Paternal Grandfather    Obesity Paternal Grandfather    Angioedema Neg Hx    Immunodeficiency Neg Hx    Urticaria Neg Hx    Stomach cancer Neg Hx    Esophageal cancer Neg Hx     Prior to Admission medications   Medication Sig Start Date End Date Taking? Authorizing Provider  FLUoxetine (PROZAC) 20 MG tablet Take 20 mg by mouth daily.   Yes [provider]  furosemide (LASIX) 20 MG tablet Take 20 mg by mouth daily as needed. 09/04/21  Yes [provider]  insulin glargine (LANTUS) 100 unit/mL SOPN Inject 20 Units into the skin 2 (two) times daily. Patient taking differently: Inject 60 Units into the skin 2 (two) times daily. 08/23/21  Yes Wouk, Ailene Rud, MD  lamoTRIgine (LAMICTAL) 100 MG tablet Take 1 tablet (100 mg total) by mouth in the morning, at noon, and at bedtime. 10/05/21 01/03/22 Yes Enzo Bi, MD  losartan (COZAAR) 25 MG tablet Take 1 tablet (25 mg total) by mouth daily. 08/23/21  Yes Wouk, Ailene Rud, MD  lubiprostone (AMITIZA) 8 MCG capsule Take 8 mcg by mouth 2 (two) times daily with a meal.   Yes [provider]  methimazole (TAPAZOLE) 10 MG tablet Take 2 tablets (20 mg total) by mouth 2 (two) times daily. 10/05/21 01/03/22 Yes Enzo Bi, MD  propranolol (INDERAL) 80 MG tablet Take 1 tablet (80 mg total) by mouth 3 (three) times daily. 10/05/21 01/03/22 Yes Enzo Bi, MD  QUEtiapine (SEROQUEL) 300 MG tablet Take 300 mg by mouth at bedtime.   Yes [provider]  QUEtiapine (SEROQUEL) 50 MG tablet Take 50 mg by mouth 2 (two) times daily.   Yes [provider]  Glucagon (BAQSIMI TWO PACK) 3 MG/DOSE POWD Place 1 spray into the nose as needed. 08/23/21   Wouk, Ailene Rud,  MD  loperamide (IMODIUM) 2 MG capsule Take 2 capsules (4 mg total) by mouth as needed for diarrhea or loose stools. 08/23/21   Wouk, Ailene Rud, MD  metoCLOPramide (REGLAN) 10 MG tablet Take 1 tablet (10 mg total) by mouth 3 (three) times daily with meals. 05/07/21 05/07/22  Hinda Kehr, MD  ondansetron (ZOFRAN-ODT) 4 MG disintegrating tablet Take 1 tablet (4 mg total) by mouth every 8 (eight) hours as needed. 07/16/21   Vladimir Crofts, MD  promethazine (PHENERGAN) 25 MG suppository Place 1 suppository (25 mg total) rectally every 8 (eight) hours as needed for nausea or vomiting. 06/30/21   Nance Pear, MD  QUEtiapine (SEROQUEL) 100 MG tablet Take 100 mg by mouth at bedtime. Takes 3 tablets (300 mg) at bedtime for sleep  [provider]  prochlorperazine (COMPAZINE) 25 MG suppository PLACE 1 SUPPOSITORY (25 MG TOTAL) RECTALLY EVERY TWELVE HOURS AS NEEDED FOR NAUSEA OR VOMITING. 02/29/20 02/29/20  Maretta Bees, MD    Physical Exam: Vitals:   12/31/21 1110 12/31/21 1514 12/31/21 2048  BP: 119/80 123/70 (!) 154/79  Pulse: (!) 133 (!) 130 (!) 109  Resp: 20 19 16   Temp: 99 F (37.2 C) 98 F (36.7 C) 98.9 F (37.2 C)  TempSrc: Oral  Oral  SpO2: 99% 99% 98%   GENERAL: 21 year-old patient lying in the bed with no acute distress.  EYES: Pupils equal, round, reactive to light and accommodation. No scleral icterus. Extraocular muscles intact.  HEENT: Head atraumatic, normocephalic. Oropharynx and nasopharynx clear.  NECK:  Supple, no jugular venous distention. No thyroid enlargement, no tenderness.  LUNGS: Diminished breath sounds but clinically clear, no wheezing, rales,rhonchi or crepitation. No use of accessory muscles of respiration.  CARDIOVASCULAR: S1, S2 normal. No murmurs, rubs, or gallops.  ABDOMEN: Soft, nontender, nondistended. Bowel sounds present. No organomegaly or mass.  EXTREMITIES: No pedal edema, cyanosis, or clubbing.  NEUROLOGIC: Cranial nerves II through XII are  intact.  Muscle strength 5/5 in all extremities. Sensation intact.  No tremulousness appreciated.   PSYCHIATRIC: The patient is alert and oriented x 3.  SKIN: No obvious rash, lesion, or ulcer.   Data Reviewed: Sodium 135 potassium 3.3, chloride 102, glucose 122, BUN 11, creatinine 0.34 AST 87 ALT 52, WBC 4.8 hemoglobin 10.8 hematocrit 34 MCV 74 platelet count 206  TSH less than 0.010 with T4 greater than 5.50.  Unchanged compared to prior.  CT head: Shows no acute intracranial pathology.  No seizure foci was noted.  MRI from February 2023 reviewed.  Showed normal brain MRI.  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  She was discharged home on methimazole and reports compliance.  She presents to the ED today with chief complaints of central abdominal pain, nausea, vomiting and multiple seizures today.   Severe hyperthyroidism: Abnormal thyroid function tests.  Unchanged compared to prior.  Currently on propranolol 80 mg 3 times daily.  She is also on methimazole.  Was given a double dose of methimazole in the ED.  She currently does not exhibit any signs of thyroid storm except for tachycardia.  She has had similar presentations in the past.  TSH and free T4 is unchanged compared to prior visits.  She has an upcoming endocrinology visit at [facility].  Seizure-like activity: Recurrent admissions in the past for same.  CT scan on this admission was negative for any intracranial abnormality.  Patient is on Lamictal at baseline for seizure disorder.  Patient was resumed on Lamictal 100 mg twice daily per home dose.  Will order for Lamictal levels.  Unclear if patient has been compliant due to intractable nausea and vomiting.  As needed Ativan.  Neurology input.  Intractable nausea and vomiting: Likely due to diabetic gastropathy, chronic and recurrent.   Patient is on multiple medications at home for same.  Type 1 diabetes mellitus: Last A1c was 6.3.  Patient is on insulin glargine at home.  Patient will optimize with insulin sliding scale during the course of stay.  Diabetic neuropathy: Patient will need to be considered for Lyrica or gabapentin for pain management.  Iron deficiency anemia: Will send for anemia workup with iron, TIBC  and ferritin levels.  Hypokalemia: Potassium replacement protocol will be instituted.   Advance Care Planning:   Code Status: Full Code   Consults: Neurology and endocrinology will be consulted if available at [facility].  Family Communication: Discussed plan of care as detailed with patient.  She agrees to this plan as detailed  Severity of Illness: The appropriate patient status for this patient is INPATIENT. Inpatient status is judged to be reasonable and necessary in order to provide the required intensity of service to ensure the patient's safety. The patient's presenting symptoms, physical exam findings, and initial radiographic and laboratory data in the context of their chronic comorbidities is felt to place them at high risk for further clinical deterioration. Furthermore, it is not anticipated that the patient will be medically stable for discharge from the hospital within 2 midnights of admission.   * I certify that at the point of admission it is my clinical judgment that the patient will require inpatient hospital care spanning beyond 2 midnights from the point of admission due to high intensity of service, high risk for further deterioration and high frequency of surveillance required.*  Author: Artist Beach, MD 12/31/2021 10:41 PM  For on call review [email or web address].

## 2021-12-31 NOTE — ED Provider Triage Note (Signed)
  Emergency Medicine Provider Triage Evaluation Note  Nancy [name] , a 21 y.o.female,  was evaluated in triage.  Pt complains of seizures.  She states she has had 4-5 seizures over the past hour.  While involved a fall where she hit her head on the washing machine.  She states that she is here because her doctor is concerned that she may experiencing a thyroid storm, which she has had in the past.  Endorses some chest pain at this time.     Review of Systems  Positive: Chest pain, seizures Negative: Denies fever, chest pain, vomiting  Physical Exam  There were no vitals filed for this visit. Gen:   Awake, no distress   Resp:  Normal effort  MSK:   Moves extremities without difficulty  Other:  Actively nodding her head back-and-forth at this time.  Medical Decision Making  Given the patient's initial medical screening exam, the following diagnostic evaluation has been ordered. The patient will be placed in the appropriate treatment space, once one is available, to complete the evaluation and treatment. I have discussed the plan of care with the patient and I have advised the patient that an ED physician or mid-level practitioner will reevaluate their condition after the test results have been received, as the results may give them additional insight into the type of treatment they may need.    Diagnostics: Labs, EKG, head CT, UA.  Treatments: none immediately   Varney Daily, [place] 12/31/21 1108

## 2021-12-31 NOTE — ED Provider Notes (Signed)
[facility] Provider Note    Event Date/Time   First MD Initiated Contact with Patient 12/31/21 1751     (approximate)   History   Seizures   HPI  Nancy [name] is a 21 y.o. female with past medical history of hypothyroidism, seizures, here with seizures, nausea, vomiting.  The patient states that for the last 3 days she has felt generally fatigued.  She has been nauseous and has had several episodes of emesis.  She has had diarrhea.  She states this happens when she gets hypothyroid.  She believes she has been taking her medications as prescribed but sometimes forgets.  She also had "multiple" seizures throughout the day today.  Of note, the patient has had nonepileptic seizures.  She states that she is somewhat alert during the beginning of these but then does not recall.  No tongue biting.  No focal neurological deficits.  She states she feels fatigued but otherwise back to her baseline.     Physical Exam   Triage Vital Signs: ED Triage Vitals  Enc Vitals Group     BP 12/31/21 1110 119/80     Pulse Rate 12/31/21 1110 (!) 133     Resp 12/31/21 1110 20     Temp 12/31/21 1110 99 F (37.2 C)     Temp Source 12/31/21 1110 Oral     SpO2 12/31/21 1110 99 %     Weight --      Height --      Head Circumference --      Peak Flow --      Pain Score 12/31/21 1108 0     Pain Loc --      Pain Edu? --      Excl. in [place]? --     Most recent vital signs: Vitals:   12/31/21 1514 12/31/21 2048  BP: 123/70 (!) 154/79  Pulse: (!) 130 (!) 109  Resp: 19 16  Temp: 98 F (36.7 C) 98.9 F (37.2 C)  SpO2: 99% 98%     General: Awake, no distress.  CV:  Tachycardic, regular rhythm. Resp:  Normal effort.  Lungs clear to auscultation bilaterally. Abd:  No distention.  Other:  Anxious, mildly tremulous.  Cranial nerves II through XII intact.  Strength out of 5 bilateral upper and lower extremities.  Normal station to light touch.   ED Results / Procedures /  Treatments   Labs (all labs ordered are listed, but only abnormal results are displayed) Labs Reviewed  COMPREHENSIVE METABOLIC PANEL - Abnormal; Notable for the following components:      Result Value   Potassium 3.3 (*)    Glucose, Bld 122 (*)    Creatinine, Ser 0.34 (*)    AST 87 (*)    ALT 52 (*)    Total Bilirubin 1.6 (*)    All other components within normal limits  CBC WITH DIFFERENTIAL/PLATELET - Abnormal; Notable for the following components:   Hemoglobin 10.8 (*)    HCT 33.9 (*)    MCV 74.0 (*)    MCH 23.6 (*)    All other components within normal limits  TSH - Abnormal; Notable for the following components:   TSH <0.010 (*)    All other components within normal limits  T4, FREE - Abnormal; Notable for the following components:   Free T4 >5.50 (*)    All other components within normal limits  CULTURE, BLOOD (SINGLE)  PROCALCITONIN  URINALYSIS, ROUTINE W REFLEX MICROSCOPIC  LAMOTRIGINE LEVEL  CBC  COMPREHENSIVE METABOLIC PANEL  CBC  HEMOGLOBIN A1C  POC URINE PREG, ED  TROPONIN I (HIGH SENSITIVITY)  TROPONIN I (HIGH SENSITIVITY)     EKG Sinus tachycardia, trickle rate 134.  PR 112, cures 70, QTc 439.  No acute ST elevations or depressions.  No acute evidence of acute ischemia or infarct.   RADIOLOGY CT head: No acute intracranial malady   I also independently reviewed and agree with radiologist interpretations.   PROCEDURES:  Critical Care performed: Yes, see critical care procedure note(s)  .Critical Care  Performed by: Duffy Bruce, MD Authorized by: Duffy Bruce, MD   Critical care provider statement:    Critical care time (minutes):  30   Critical care time was exclusive of:  Separately billable procedures and treating other patients   Critical care was necessary to treat or prevent imminent or life-threatening deterioration of the following conditions:  Cardiac failure, circulatory failure and respiratory failure   Critical care was  time spent personally by me on the following activities:  Development of treatment plan with patient or surrogate, discussions with consultants, evaluation of patient's response to treatment, examination of patient, ordering and review of laboratory studies, ordering and review of radiographic studies, ordering and performing treatments and interventions, pulse oximetry, re-evaluation of patient's condition and review of old charts     MEDICATIONS ORDERED IN ED: Medications  propranolol (INDERAL) tablet 60 mg (60 mg Oral Given 12/31/21 2228)  Iodine Strong (Lugols) 5 % solution 0.4 mL (0.4 mLs Oral Given 12/31/21 2224)  acetaminophen (TYLENOL) tablet 650 mg (650 mg Oral Patient Refused/Not Given 12/31/21 2048)  losartan (COZAAR) tablet 25 mg (has no administration in time range)  propranolol (INDERAL) tablet 80 mg (has no administration in time range)  FLUoxetine (PROZAC) capsule 20 mg (has no administration in time range)  QUEtiapine (SEROQUEL) tablet 300 mg (has no administration in time range)  QUEtiapine (SEROQUEL) tablet 50 mg (has no administration in time range)  insulin glargine-yfgn (SEMGLEE) injection 20 Units (has no administration in time range)  methimazole (TAPAZOLE) tablet 20 mg (has no administration in time range)  lubiprostone (AMITIZA) capsule 8 mcg (has no administration in time range)  loperamide (IMODIUM) capsule 4 mg (has no administration in time range)  lamoTRIgine (LAMICTAL) tablet 100 mg (has no administration in time range)  enoxaparin (LOVENOX) injection 40 mg (has no administration in time range)  0.9 %  sodium chloride infusion (has no administration in time range)  traMADol (ULTRAM) tablet 50 mg (has no administration in time range)  ondansetron (ZOFRAN) tablet 4 mg (has no administration in time range)    Or  ondansetron (ZOFRAN) injection 4 mg (has no administration in time range)  albuterol (PROVENTIL) (2.5 MG/3ML) 0.083% nebulizer solution 2.5 mg (has no  administration in time range)  insulin aspart (novoLOG) injection 0-15 Units (has no administration in time range)  LORazepam (ATIVAN) injection 1 mg (1 mg Intravenous Given 12/31/21 1925)  hydrocortisone sodium succinate (SOLU-CORTEF) 100 MG injection 300 mg (300 mg Intravenous Given 12/31/21 1942)  methimazole (TAPAZOLE) tablet 40 mg (40 mg Oral Given 12/31/21 2005)  sodium chloride 0.9 % bolus 500 mL (0 mLs Intravenous Stopped 12/31/21 2135)  lamoTRIgine (LAMICTAL) tablet 200 mg (200 mg Oral Given 12/31/21 1924)     IMPRESSION / MDM / [place] / ED COURSE  I reviewed the triage vital signs and the nursing notes.  Differential diagnosis includes, but is not limited to, thyrotoxicosis with breakthrough seizures, nonepileptic seizures, occult infection causing breakthrough.  Patient's presentation is most consistent with acute presentation with potential threat to life or bodily function.  The patient is on the cardiac monitor to evaluate for evidence of arrhythmia and/or significant heart rate changes.  21 yo F here with seizure like activity, nausea, vomiting. Pt has h/o hyperthyroidism, seizures (though ?non epileptic seizures per chart). Clinically, pt tachycardic but o/w at mental baseline. Labs, imaging reviewed. CT head shows NAICA. CBC shows no abnormality. CMP with mild transaminitis but iso /w unremarkable. Trop negative. Procal negative. TSH undetectable and free T>5.5.   Suspect symptomatic hyperthyroidism with ? PNES. Will give her an additional dose of lamictal, start on tx for hyperthyroidism w/ propranolol, increased methimazole dosage, lugol's, hydrocortisone.Marland Kitchen      FINAL CLINICAL IMPRESSION(S) / ED DIAGNOSES   Final diagnoses:  Hyperthyroidism  Seizure-like activity (HCC)     Rx / DC Orders   ED Discharge Orders     None        Note:  This document was prepared using Dragon voice recognition software and may include  unintentional dictation errors.   Shaune Pollack, MD 12/31/21 [phone]

## 2022-01-01 DIAGNOSIS — E0520 Thyrotoxicosis with toxic multinodular goiter without thyrotoxic crisis or storm: Secondary | ICD-10-CM

## 2022-01-01 LAB — CBC
HCT: 31 % — ABNORMAL LOW (ref 36.0–46.0)
Hemoglobin: 9.7 g/dL — ABNORMAL LOW (ref 12.0–15.0)
MCH: 23.4 pg — ABNORMAL LOW (ref 26.0–34.0)
MCHC: 31.3 g/dL (ref 30.0–36.0)
MCV: 74.9 fL — ABNORMAL LOW (ref 80.0–100.0)
Platelets: 201 10*3/uL (ref 150–400)
RBC: 4.14 MIL/uL (ref 3.87–5.11)
RDW: 13.9 % (ref 11.5–15.5)
WBC: 4.3 10*3/uL (ref 4.0–10.5)
nRBC: 0 % (ref 0.0–0.2)

## 2022-01-01 LAB — COMPREHENSIVE METABOLIC PANEL
ALT: 82 U/L — ABNORMAL HIGH (ref 0–44)
AST: 105 U/L — ABNORMAL HIGH (ref 15–41)
Albumin: 3.4 g/dL — ABNORMAL LOW (ref 3.5–5.0)
Alkaline Phosphatase: 100 U/L (ref 38–126)
Anion gap: 4 — ABNORMAL LOW (ref 5–15)
BUN: 20 mg/dL (ref 6–20)
CO2: 26 mmol/L (ref 22–32)
Calcium: 9 mg/dL (ref 8.9–10.3)
Chloride: 111 mmol/L (ref 98–111)
Creatinine, Ser: 0.42 mg/dL — ABNORMAL LOW (ref 0.44–1.00)
GFR, Estimated: >60 mL/min (ref >60)
Glucose, Bld: 135 mg/dL — ABNORMAL HIGH (ref 70–99)
Potassium: 3.1 mmol/L — ABNORMAL LOW (ref 3.5–5.1)
Sodium: 141 mmol/L (ref 135–145)
Total Bilirubin: 0.7 mg/dL (ref 0.3–1.2)
Total Protein: 6.4 g/dL — ABNORMAL LOW (ref 6.5–8.1)

## 2022-01-01 LAB — IRON AND TIBC
Iron: 49 ug/dL (ref 28–170)
Saturation Ratios: 15 % (ref 10.4–31.8)
TIBC: 323 ug/dL (ref 250–450)
UIBC: 274 ug/dL

## 2022-01-01 LAB — CBG MONITORING, ED
Glucose-Capillary: 344 mg/dL — ABNORMAL HIGH (ref 70–99)
Glucose-Capillary: 250 mg/dL — ABNORMAL HIGH (ref 70–99)
Glucose-Capillary: 113 mg/dL — ABNORMAL HIGH (ref 70–99)

## 2022-01-01 LAB — FOLATE: Folate: 17 ng/mL (ref >5.9)

## 2022-01-01 LAB — FERRITIN: Ferritin: 103 ng/mL (ref 11–307)

## 2022-01-01 MED ORDER — ONDANSETRON 4 MG PO TBDP: 4.0000 mg | ORAL_TABLET | Freq: Three times a day (TID) | ORAL | 0 refills | Status: DC | PRN

## 2022-01-01 MED ORDER — POTASSIUM CHLORIDE CRYS ER 20 MEQ PO TBCR
20.0000 meq | EXTENDED_RELEASE_TABLET | ORAL | Status: AC
  Administered 2022-01-01 (×3): 20 meq via ORAL
  Filled 2022-01-01 (×3): qty 1

## 2022-01-01 NOTE — ED Notes (Signed)
Nancy [name] in mother/baby states pt. Is not appropriate admission.

## 2022-01-01 NOTE — Inpatient Diabetes Management (Signed)
Inpatient Diabetes Program Recommendations  AACE/ADA: New Consensus Statement on Inpatient Glycemic Control   Target Ranges:  Prepandial:   less than 140 mg/dL      Peak postprandial:   less than 180 mg/dL (1-2 hours)      Critically ill patients:  140 - 180 mg/dL    Latest Reference Range & Units 01/01/22 02:00 01/01/22 09:10  Glucose-Capillary 70 - 99 mg/dL 419 (H) 622 (H)   Review of Glycemic Control  Diabetes history: DM2 Outpatient Diabetes medications: Lantus 60 units BID Current orders for Inpatient glycemic control: Semglee 20 units BID, Novolog 0-15 units TID with meals  Inpatient Diabetes Program Recommendations:    Insulin: Please consider ordering Novolog 0-5 units QHS for bedtime correction.  Thanks, Orlando Penner, RN, MSN, CDCES Diabetes Coordinator Inpatient Diabetes Program [phone] (Team Pager from 8am to 5pm)

## 2022-01-01 NOTE — ED Notes (Signed)
MD at bedside discussing with patient that she will be discharged,  pt ambulatory with a steady gait, stating her ride is here and not willing to wait for the completed D/C paperwork,  printed AVS that stated C/C and DX,   IV removed , VSS assessed.

## 2022-01-01 NOTE — Discharge Summary (Signed)
Physician Discharge Summary   Patient: Nancy [name] MRN: [id number] DOB: 09-May-2000  Admit date:     12/31/2021  Discharge date: 01/01/22  Discharge Physician: Lilia Pro   PCP: Fleet Contras, MD   Recommendations at discharge:   Patient left hospital AGAINST MEDICAL ADVICE.  She left prior to provider formally discharging patient. 2.  Compliance with medications for nausea and vomiting was advised.  Prescriptions were sent to her pharmacy in [place], [place] 3.  Patient advised to follow-up with endocrinologist as scheduled with [facility] in the coming weeks.  Compliance with hypothyroidism medications was advised.  Discharge Diagnoses: Principal Problem:   Thyrotoxicosis Seizure-like activity: Unwitnessed.  None during the course of stay. Intractable nausea and vomiting Resolved Problems:   * No resolved hospital problems. 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  She was discharged home on methimazole and reports compliance.  She presents to the ED today with chief complaints of central abdominal pain, nausea, vomiting and ?multiple seizures today.    Assessment and Plan:  Severe hyperthyroidism: Abnormal thyroid function tests.  Unchanged compared to prior.  Patient does not exhibit any signs of thyrotoxicosis at this time.  Currently on propranolol 80 mg 3 times daily with rate adequately controlled.  Compliance with methimazole was advised.  Patient reports, she has an upcoming endocrinology clinic follow-up in the coming days.  Patient was extensively counseled about the need for her to keep her appointment as appropriate.   Seizure-like activity: Recurrent admissions in the past for same.  CT scan on this admission was negative for any intracranial abnormality.  Patient was on Lamictal at baseline for seizure  disorder.  Patient was resumed on Lamictal 100 mg twice daily per home dose.  Lamictal levels were ordered and pending. Neurology was also consulted but patient left before neurology input.    Intractable nausea and vomiting: Likely due to diabetic gastropathy versus irritable bowel syndrome, chronic and recurrent.  Patient is on multiple medications at home for same.  She follows up with gastroenterology as outpatient.  Will defer any further management to outpatient.  Zofran was offered on as outpatient treatment.   Type 1 diabetes mellitus: Last A1c was 6.3.  Patient is on insulin glargine at home.     Diabetic neuropathy: Patient will need to be considered for Lyrica or gabapentin for pain management.   Iron deficiency anemia: Will send for anemia workup with iron, TIBC and ferritin levels.   Hypokalemia: Potassium replacement protocol will be instituted.    Advance Care Planning:   Code Status: Full Code     Consultants: Neurology consulted but patient left AMA Procedures performed: None  Disposition: Home Diet recommendation:  Discharge Diet Orders (From admission, onward)     Start     Ordered   01/01/22 0000  Diet - low sodium heart healthy        01/01/22 1514           Carb modified diet DISCHARGE MEDICATION: Allergies as of 01/01/2022       Reactions   Oatmeal Anaphylaxis   Tomato Anaphylaxis   Acetaminophen Other (See Comments)   Avoids due to liver   Ibuprofen Other (See Comments)   GI MD said to not take this anymore        Medication List     STOP taking these  medications    promethazine 25 MG suppository Commonly known as: PHENERGAN       TAKE these medications    Baqsimi Two Pack 3 MG/DOSE Powd Generic drug: Glucagon Place 1 spray into the nose as needed.   FLUoxetine 20 MG tablet Commonly known as: PROZAC Take 20 mg by mouth daily.   furosemide 20 MG tablet Commonly known as: LASIX Take 20 mg by mouth daily as needed.   insulin  glargine 100 unit/mL Sopn Commonly known as: LANTUS Inject 20 Units into the skin 2 (two) times daily. What changed: how much to take   lamoTRIgine 100 MG tablet Commonly known as: LAMICTAL Take 1 tablet (100 mg total) by mouth in the morning, at noon, and at bedtime.   loperamide 2 MG capsule Commonly known as: IMODIUM Take 2 capsules (4 mg total) by mouth as needed for diarrhea or loose stools.   losartan 25 MG tablet Commonly known as: COZAAR Take 1 tablet (25 mg total) by mouth daily.   lubiprostone 8 MCG capsule Commonly known as: AMITIZA Take 8 mcg by mouth 2 (two) times daily with a meal.   methimazole 10 MG tablet Commonly known as: TAPAZOLE Take 2 tablets (20 mg total) by mouth 2 (two) times daily.   metoCLOPramide 10 MG tablet Commonly known as: REGLAN Take 1 tablet (10 mg total) by mouth 3 (three) times daily with meals.   ondansetron 4 MG disintegrating tablet Commonly known as: ZOFRAN-ODT Take 1 tablet (4 mg total) by mouth every 8 (eight) hours as needed.   propranolol 80 MG tablet Commonly known as: INDERAL Take 1 tablet (80 mg total) by mouth 3 (three) times daily.   QUEtiapine 50 MG tablet Commonly known as: SEROQUEL Take 50 mg by mouth 2 (two) times daily.   QUEtiapine 300 MG tablet Commonly known as: SEROQUEL Take 300 mg by mouth at bedtime.   QUEtiapine 100 MG tablet Commonly known as: SEROQUEL Take 100 mg by mouth at bedtime. Takes 3 tablets (300 mg) at bedtime for sleep        Discharge Exam: There were no vitals filed for this visit.   Condition at discharge: good. Left AMA  The results of significant diagnostics from this hospitalization (including imaging, microbiology, ancillary and laboratory) are listed below for reference.   Imaging Studies: CT Head Wo Contrast  Result Date: 12/31/2021 CLINICAL DATA:  Seizures. EXAM: CT HEAD WITHOUT CONTRAST TECHNIQUE: Contiguous axial images were obtained from the base of the skull through  the vertex without intravenous contrast. RADIATION DOSE REDUCTION: This exam was performed according to the departmental dose-optimization program which includes automated exposure control, adjustment of the mA and/or kV according to patient size and/or use of iterative reconstruction technique. COMPARISON:  CT Head 08/06/21 FINDINGS: Brain: No evidence of acute infarction, hemorrhage, hydrocephalus, extra-axial collection or mass lesion/mass effect. Vascular: No hyperdense vessel or unexpected calcification. Skull: Normal. Negative for fracture or focal lesion. Sinuses/Orbits: No acute finding. Other: None. IMPRESSION: No acute intracranial pathology.  No seizure focus identified. Electronically Signed   By: Lorenza Cambridge M.D.   On: 12/31/2021 12:13    Microbiology: Results for orders placed or performed during the hospital encounter of 10/27/21  MRSA Next Gen by PCR, Nasal     Status: None   Collection Time: 10/27/21  9:32 PM   Specimen: Nasal Mucosa; Nasal Swab  Result Value Ref Range Status   MRSA by PCR Next Gen NOT DETECTED NOT DETECTED Final    Comment: (  NOTE) The GeneXpert MRSA Assay (FDA approved for NASAL specimens only), is one component of a comprehensive MRSA colonization surveillance program. It is not intended to diagnose MRSA infection nor to guide or monitor treatment for MRSA infections. Test performance is not FDA approved in patients less than 63 years old. Performed at [facility], [address] [place], [place] [address]     Labs: CBC: Recent Labs  Lab 12/31/21 1111 01/01/22 1204  WBC 4.8 4.3  NEUTROABS 2.8  --   HGB 10.8* 9.7*  HCT 33.9* 31.0*  MCV 74.0* 74.9*  PLT 206 201   Basic Metabolic Panel: Recent Labs  Lab 12/31/21 1111 01/01/22 1204  NA 135 141  K 3.3* 3.1*  CL 102 111  CO2 22 26  GLUCOSE 122* 135*  BUN 11 20  CREATININE 0.34* 0.42*  CALCIUM 9.3 9.0   Liver Function Tests: Recent Labs  Lab 12/31/21 1111 01/01/22 1204  AST  87* 105*  ALT 52* 82*  ALKPHOS 114 100  BILITOT 1.6* 0.7  PROT 7.0 6.4*  ALBUMIN 4.0 3.4*   CBG: Recent Labs  Lab 01/01/22 0200 01/01/22 0910 01/01/22 1256  GLUCAP 344* 250* 113*    Discharge time spent: less than 30 minutes.  Signed: Lilia Pro, MD [facility] 01/01/2022

## 2022-01-01 NOTE — ED Notes (Signed)
Attempted to call report to mother/baby, no answer

## 2022-01-02 LAB — HEMOGLOBIN A1C
Hgb A1c MFr Bld: 6.5 % — ABNORMAL HIGH (ref 4.8–5.6)
Mean Plasma Glucose: 140 mg/dL

## 2022-01-02 LAB — LAMOTRIGINE LEVEL: Lamotrigine Lvl: 2.1 ug/mL (ref 2.0–20.0)

## 2022-01-06 LAB — CULTURE, BLOOD (SINGLE)
Culture: NO GROWTH
Special Requests: ADEQUATE

## 2022-01-25 ENCOUNTER — Emergency Department: Payer: Medicaid Other

## 2022-01-25 ENCOUNTER — Other Ambulatory Visit: Payer: Self-pay

## 2022-01-25 ENCOUNTER — Inpatient Hospital Stay
Admission: EM | Admit: 2022-01-25 | Discharge: 2022-01-27 | DRG: 644 | Disposition: A | Discharge status: Home / Self Care | Payer: Medicaid Other | Attending: Internal Medicine | Admitting: Internal Medicine

## 2022-01-25 DIAGNOSIS — M545 Low back pain, unspecified: Secondary | ICD-10-CM | POA: Diagnosis present

## 2022-01-25 DIAGNOSIS — E0591 Thyrotoxicosis, unspecified with thyrotoxic crisis or storm: Principal | ICD-10-CM | POA: Diagnosis present

## 2022-01-25 DIAGNOSIS — D509 Iron deficiency anemia, unspecified: Secondary | ICD-10-CM | POA: Diagnosis present

## 2022-01-25 DIAGNOSIS — E1043 Type 1 diabetes mellitus with diabetic autonomic (poly)neuropathy: Secondary | ICD-10-CM | POA: Diagnosis present

## 2022-01-25 DIAGNOSIS — F445 Conversion disorder with seizures or convulsions: Secondary | ICD-10-CM | POA: Diagnosis present

## 2022-01-25 DIAGNOSIS — E669 Obesity, unspecified: Secondary | ICD-10-CM | POA: Diagnosis present

## 2022-01-25 DIAGNOSIS — Z91018 Allergy to other foods: Secondary | ICD-10-CM

## 2022-01-25 DIAGNOSIS — Z886 Allergy status to analgesic agent status: Secondary | ICD-10-CM | POA: Diagnosis not present

## 2022-01-25 DIAGNOSIS — E0590 Thyrotoxicosis, unspecified without thyrotoxic crisis or storm: Secondary | ICD-10-CM | POA: Diagnosis not present

## 2022-01-25 DIAGNOSIS — Z79899 Other long term (current) drug therapy: Secondary | ICD-10-CM

## 2022-01-25 DIAGNOSIS — Z91148 Patient's other noncompliance with medication regimen for other reason: Secondary | ICD-10-CM

## 2022-01-25 DIAGNOSIS — F32A Depression, unspecified: Secondary | ICD-10-CM | POA: Diagnosis present

## 2022-01-25 DIAGNOSIS — Z794 Long term (current) use of insulin: Secondary | ICD-10-CM

## 2022-01-25 DIAGNOSIS — K3184 Gastroparesis: Secondary | ICD-10-CM | POA: Diagnosis present

## 2022-01-25 DIAGNOSIS — Z1152 Encounter for screening for COVID-19: Secondary | ICD-10-CM

## 2022-01-25 DIAGNOSIS — G4700 Insomnia, unspecified: Secondary | ICD-10-CM | POA: Diagnosis present

## 2022-01-25 DIAGNOSIS — Z825 Family history of asthma and other chronic lower respiratory diseases: Secondary | ICD-10-CM

## 2022-01-25 DIAGNOSIS — G8929 Other chronic pain: Secondary | ICD-10-CM | POA: Diagnosis present

## 2022-01-25 DIAGNOSIS — I5032 Chronic diastolic (congestive) heart failure: Secondary | ICD-10-CM | POA: Diagnosis present

## 2022-01-25 DIAGNOSIS — I110 Hypertensive heart disease with heart failure: Secondary | ICD-10-CM | POA: Diagnosis present

## 2022-01-25 DIAGNOSIS — Z833 Family history of diabetes mellitus: Secondary | ICD-10-CM

## 2022-01-25 DIAGNOSIS — Z6830 Body mass index (BMI) 30.0-30.9, adult: Secondary | ICD-10-CM | POA: Diagnosis not present

## 2022-01-25 DIAGNOSIS — Z8249 Family history of ischemic heart disease and other diseases of the circulatory system: Secondary | ICD-10-CM

## 2022-01-25 DIAGNOSIS — R569 Unspecified convulsions: Secondary | ICD-10-CM

## 2022-01-25 DIAGNOSIS — F419 Anxiety disorder, unspecified: Secondary | ICD-10-CM | POA: Diagnosis present

## 2022-01-25 DIAGNOSIS — F1910 Other psychoactive substance abuse, uncomplicated: Secondary | ICD-10-CM | POA: Diagnosis present

## 2022-01-25 LAB — BLOOD GAS, VENOUS
Acid-Base Excess: 1.9 mmol/L (ref 0.0–2.0)
Bicarbonate: 26.6 mmol/L (ref 20.0–28.0)
O2 Saturation: 81 %
Patient temperature: 37
pCO2, Ven: 41 mmHg — ABNORMAL LOW (ref 44–60)
pH, Ven: 7.42 (ref 7.25–7.43)
pO2, Ven: 51 mmHg — ABNORMAL HIGH (ref 32–45)

## 2022-01-25 LAB — COMPREHENSIVE METABOLIC PANEL
ALT: 40 U/L (ref 0–44)
AST: 45 U/L — ABNORMAL HIGH (ref 15–41)
Albumin: 4.1 g/dL (ref 3.5–5.0)
Alkaline Phosphatase: 120 U/L (ref 38–126)
Anion gap: 10 (ref 5–15)
BUN: 16 mg/dL (ref 6–20)
CO2: 24 mmol/L (ref 22–32)
Calcium: 9.7 mg/dL (ref 8.9–10.3)
Chloride: 101 mmol/L (ref 98–111)
Creatinine, Ser: 0.35 mg/dL — ABNORMAL LOW (ref 0.44–1.00)
GFR, Estimated: >60 mL/min (ref >60)
Glucose, Bld: 286 mg/dL — ABNORMAL HIGH (ref 70–99)
Potassium: 3.8 mmol/L (ref 3.5–5.1)
Sodium: 135 mmol/L (ref 135–145)
Total Bilirubin: 1.4 mg/dL — ABNORMAL HIGH (ref 0.3–1.2)
Total Protein: 7.8 g/dL (ref 6.5–8.1)

## 2022-01-25 LAB — HCG, QUANTITATIVE, PREGNANCY: hCG, Beta Chain, Quant, S: 1 m[IU]/mL (ref <5)

## 2022-01-25 LAB — PHOSPHORUS: Phosphorus: 4.4 mg/dL (ref 2.5–4.6)

## 2022-01-25 LAB — CBC WITH DIFFERENTIAL/PLATELET
Abs Immature Granulocytes: 0.03 10*3/uL (ref 0.00–0.07)
Basophils Absolute: 0 10*3/uL (ref 0.0–0.1)
Basophils Relative: 0 %
Eosinophils Absolute: 0 10*3/uL (ref 0.0–0.5)
Eosinophils Relative: 0 %
HCT: 36.1 % (ref 36.0–46.0)
Hemoglobin: 11.5 g/dL — ABNORMAL LOW (ref 12.0–15.0)
Immature Granulocytes: 0 %
Lymphocytes Relative: 20 %
Lymphs Abs: 2.6 10*3/uL (ref 0.7–4.0)
MCH: 23.9 pg — ABNORMAL LOW (ref 26.0–34.0)
MCHC: 31.9 g/dL (ref 30.0–36.0)
MCV: 75.1 fL — ABNORMAL LOW (ref 80.0–100.0)
Monocytes Absolute: 1 10*3/uL (ref 0.1–1.0)
Monocytes Relative: 8 %
Neutro Abs: 9 10*3/uL — ABNORMAL HIGH (ref 1.7–7.7)
Neutrophils Relative %: 72 %
Platelets: 244 10*3/uL (ref 150–400)
RBC: 4.81 MIL/uL (ref 3.87–5.11)
RDW: 15 % (ref 11.5–15.5)
WBC: 12.6 10*3/uL — ABNORMAL HIGH (ref 4.0–10.5)
nRBC: 0 % (ref 0.0–0.2)

## 2022-01-25 LAB — RESP PANEL BY RT-PCR (RSV, FLU A&B, COVID)  RVPGX2
Influenza A by PCR: NEGATIVE
Influenza B by PCR: NEGATIVE
Resp Syncytial Virus by PCR: NEGATIVE
SARS Coronavirus 2 by RT PCR: NEGATIVE

## 2022-01-25 LAB — LACTIC ACID, PLASMA: Lactic Acid, Venous: 1.3 mmol/L (ref 0.5–1.9)

## 2022-01-25 LAB — ETHANOL: Alcohol, Ethyl (B): <10 mg/dL (ref <10)

## 2022-01-25 LAB — CBG MONITORING, ED: Glucose-Capillary: 187 mg/dL — ABNORMAL HIGH (ref 70–99)

## 2022-01-25 LAB — TSH: TSH: <0.01 u[IU]/mL — ABNORMAL LOW (ref 0.350–4.500)

## 2022-01-25 LAB — MAGNESIUM: Magnesium: 2 mg/dL (ref 1.7–2.4)

## 2022-01-25 LAB — T4, FREE: Free T4: 5.22 ng/dL — ABNORMAL HIGH (ref 0.61–1.12)

## 2022-01-25 MED ORDER — LORAZEPAM 2 MG/ML IJ SOLN
INTRAMUSCULAR | Status: AC
  Filled 2022-01-25: qty 1

## 2022-01-25 MED ORDER — METOCLOPRAMIDE HCL 5 MG/ML IJ SOLN
10.0000 mg | Freq: Once | INTRAMUSCULAR | Status: AC
  Administered 2022-01-25: 10 mg via INTRAVENOUS
  Filled 2022-01-25: qty 2

## 2022-01-25 MED ORDER — IODINE STRONG (LUGOLS) 5 % PO SOLN
0.2000 mL | Freq: Three times a day (TID) | ORAL | Status: DC
  Administered 2022-01-25 – 2022-01-26 (×3): 0.2 mL via ORAL
  Filled 2022-01-25 (×5): qty 0.2

## 2022-01-25 MED ORDER — INSULIN ASPART 100 UNIT/ML IJ SOLN
0.0000 [IU] | INTRAMUSCULAR | Status: DC
  Administered 2022-01-25 – 2022-01-26 (×2): 4 [IU] via SUBCUTANEOUS
  Administered 2022-01-26 (×2): 7 [IU] via SUBCUTANEOUS
  Administered 2022-01-26: 20 [IU] via SUBCUTANEOUS
  Administered 2022-01-26: 7 [IU] via SUBCUTANEOUS
  Administered 2022-01-26: 4 [IU] via SUBCUTANEOUS
  Administered 2022-01-27: 7 [IU] via SUBCUTANEOUS
  Filled 2022-01-25 (×9): qty 1

## 2022-01-25 MED ORDER — PROPRANOLOL HCL 20 MG PO TABS
80.0000 mg | ORAL_TABLET | Freq: Three times a day (TID) | ORAL | Status: DC
  Administered 2022-01-25 – 2022-01-26 (×4): 80 mg via ORAL
  Filled 2022-01-25 (×5): qty 4

## 2022-01-25 MED ORDER — PROPRANOLOL HCL 1 MG/ML IV SOLN: 1.0000 mg | Freq: Once | INTRAVENOUS | Status: DC

## 2022-01-25 MED ORDER — FUROSEMIDE 40 MG PO TABS: 20.0000 mg | ORAL_TABLET | Freq: Every day | ORAL | Status: DC | PRN

## 2022-01-25 MED ORDER — SODIUM CHLORIDE 0.9 % IV BOLUS
500.0000 mL | Freq: Once | INTRAVENOUS | Status: AC
  Administered 2022-01-25: 500 mL via INTRAVENOUS

## 2022-01-25 MED ORDER — METHIMAZOLE 10 MG PO TABS
20.0000 mg | ORAL_TABLET | Freq: Once | ORAL | Status: AC
  Administered 2022-01-25: 20 mg via ORAL
  Filled 2022-01-25: qty 2

## 2022-01-25 MED ORDER — DEXAMETHASONE SODIUM PHOSPHATE 4 MG/ML IJ SOLN
4.0000 mg | Freq: Three times a day (TID) | INTRAMUSCULAR | Status: DC
  Administered 2022-01-25 – 2022-01-27 (×5): 4 mg via INTRAVENOUS
  Filled 2022-01-25 (×5): qty 1

## 2022-01-25 MED ORDER — POLYETHYLENE GLYCOL 3350 17 G PO PACK
17.0000 g | PACK | Freq: Every day | ORAL | Status: DC | PRN
  Administered 2022-01-26: 17 g via ORAL
  Filled 2022-01-25: qty 1

## 2022-01-25 MED ORDER — LAMOTRIGINE 100 MG PO TABS
100.0000 mg | ORAL_TABLET | Freq: Three times a day (TID) | ORAL | Status: DC
  Administered 2022-01-25 – 2022-01-26 (×4): 100 mg via ORAL
  Filled 2022-01-25 (×5): qty 1

## 2022-01-25 MED ORDER — QUETIAPINE FUMARATE 300 MG PO TABS
300.0000 mg | ORAL_TABLET | Freq: Every day | ORAL | Status: DC
  Administered 2022-01-25 – 2022-01-26 (×2): 300 mg via ORAL
  Filled 2022-01-25 (×2): qty 1

## 2022-01-25 MED ORDER — METHIMAZOLE 10 MG PO TABS
20.0000 mg | ORAL_TABLET | Freq: Two times a day (BID) | ORAL | Status: DC
  Administered 2022-01-26 (×2): 20 mg via ORAL
  Filled 2022-01-25 (×3): qty 2

## 2022-01-25 MED ORDER — FLUOXETINE HCL 20 MG PO TABS: 20.0000 mg | ORAL_TABLET | Freq: Every day | ORAL | Status: DC

## 2022-01-25 MED ORDER — DOCUSATE SODIUM 100 MG PO CAPS: 100.0000 mg | ORAL_CAPSULE | Freq: Two times a day (BID) | ORAL | Status: DC | PRN

## 2022-01-25 MED ORDER — POTASSIUM CHLORIDE CRYS ER 20 MEQ PO TBCR
20.0000 meq | EXTENDED_RELEASE_TABLET | ORAL | Status: AC
  Administered 2022-01-25 – 2022-01-26 (×3): 20 meq via ORAL
  Filled 2022-01-25 (×3): qty 1

## 2022-01-25 MED ORDER — ONDANSETRON HCL 4 MG/2ML IJ SOLN
4.0000 mg | Freq: Four times a day (QID) | INTRAMUSCULAR | Status: DC | PRN
  Administered 2022-01-26: 4 mg via INTRAVENOUS
  Filled 2022-01-25 (×2): qty 2

## 2022-01-25 MED ORDER — PROPRANOLOL HCL 1 MG/ML IV SOLN
1.0000 mg | Freq: Once | INTRAVENOUS | Status: AC
  Administered 2022-01-25: 1 mg via INTRAVENOUS
  Filled 2022-01-25: qty 1

## 2022-01-25 MED ORDER — METHIMAZOLE 10 MG PO TABS: 20.0000 mg | ORAL_TABLET | Freq: Two times a day (BID) | ORAL | Status: DC

## 2022-01-25 MED ORDER — LEVETIRACETAM IN NACL 1000 MG/100ML IV SOLN
1000.0000 mg | INTRAVENOUS | Status: AC
  Administered 2022-01-25 (×2): 1000 mg via INTRAVENOUS
  Filled 2022-01-25: qty 100

## 2022-01-25 MED ORDER — SODIUM CHLORIDE 0.9 % IV BOLUS
1000.0000 mL | Freq: Once | INTRAVENOUS | Status: AC
  Administered 2022-01-25: 1000 mL via INTRAVENOUS

## 2022-01-25 MED ORDER — METOCLOPRAMIDE HCL 5 MG/ML IJ SOLN
10.0000 mg | Freq: Four times a day (QID) | INTRAMUSCULAR | Status: DC | PRN
  Administered 2022-01-26 (×3): 10 mg via INTRAVENOUS
  Filled 2022-01-25 (×3): qty 2

## 2022-01-25 MED ORDER — POTASSIUM IODIDE (EXPECTORANT) 1 GM/ML PO SOLN
250.0000 mg | Freq: Once | ORAL | Status: AC | PRN
  Administered 2022-01-25: 250 mg via ORAL
  Filled 2022-01-25: qty 0.25

## 2022-01-25 NOTE — ED Provider Notes (Signed)
[facility] Provider Note    Event Date/Time   First MD Initiated Contact with Patient 01/25/22 1511     (approximate)   History   Seizures   HPI  Nancy [name] is a 22 y.o. female   Past medical history of type I diabetes, DKA, hyperthyroidism, seizures, iron deficient anemia who presents to the emergency department with increase in her seizure activities over the last several days.  Approximately 8-10 seizures over the last 2 days.  Typically has 3 to 4/week.  She reports no other acute medical illnesses, no recent respiratory infectious symptoms, abdominal pain, changes in p.o. intake or medication changes, urinary symptoms.  Today she feels nauseous and vomited.  During 1 episode of procedure she feels like she injured her head and has some headache and neck soreness as well as myalgias diffusely.  She offers no other acute medical complaints.  She denies drug or alcohol use.  She has been compliant with her medications.  Independent historian: EMS, reports postictal on arrival to scene, family reporting 10 seizures over the last 1 hour and 2 more seizures witnessed by EMS who gave 5 mg of IM Versed x 2.  External Medical Documents Reviewed: Discharge summary dated 01/01/2022 with discharge diagnoses of hyperparathyroidism, seizures, intractable nausea and vomiting thought likely to be due to diabetic gastropathy versus IBS      Physical Exam   Triage Vital Signs: ED Triage Vitals  Enc Vitals Group     BP 01/25/22 1512 134/72     Pulse Rate 01/25/22 1512 (!) 132     Resp 01/25/22 1512 20     Temp 01/25/22 1512 98.4 F (36.9 C)     Temp Source 01/25/22 1512 Oral     SpO2 01/25/22 1512 99 %     Weight 01/25/22 1514 170 lb (77.1 kg)     Height 01/25/22 1514 5\' 3"  (1.6 m)     Head Circumference --      Peak Flow --      Pain Score 01/25/22 1514 10     Pain Loc --      Pain Edu? --      Excl. in [place]? --     Most recent vital  signs: Vitals:   01/25/22 1630 01/25/22 1700  BP: 131/66 113/67  Pulse: (!) 135 (!) 137  Resp: (!) 23 (!) 27  Temp:    SpO2: 100% 100%    General: Awake, no distress.  CV:  Good peripheral perfusion.  Resp:  Normal effort.  Abd:  No distention.  Other:  Wake alert oriented pleasant, moves all extremities, skin appears warm well-perfused, she has tachycardia 131 and mild hypertension at 140s over 80s, respiratory rate is increased in the 20s to 30s, no hypoxemia and no fever.  There is no proptosis no tremors or other signs of thyrotoxicosis currently.  Abdomen is soft and nontender, she has some mild left-sided paraspinal C-spine tenderness and reports headache.  Lungs clear without focality or wheezing.   ED Results / Procedures / Treatments   Labs (all labs ordered are listed, but only abnormal results are displayed) Labs Reviewed  COMPREHENSIVE METABOLIC PANEL - Abnormal; Notable for the following components:      Result Value   Glucose, Bld 286 (*)    Creatinine, Ser 0.35 (*)    AST 45 (*)    Total Bilirubin 1.4 (*)    All other components within normal limits  CBC WITH DIFFERENTIAL/PLATELET -  Abnormal; Notable for the following components:   WBC 12.6 (*)    Hemoglobin 11.5 (*)    MCV 75.1 (*)    MCH 23.9 (*)    Neutro Abs 9.0 (*)    All other components within normal limits  TSH - Abnormal; Notable for the following components:   TSH <0.010 (*)    All other components within normal limits  T4, FREE - Abnormal; Notable for the following components:   Free T4 5.22 (*)    All other components within normal limits  BLOOD GAS, VENOUS - Abnormal; Notable for the following components:   pCO2, Ven 41 (*)    pO2, Ven 51 (*)    All other components within normal limits  RESP PANEL BY RT-PCR (RSV, FLU A&B, COVID)  RVPGX2  ETHANOL  LACTIC ACID, PLASMA  MAGNESIUM  PHOSPHORUS  HCG, QUANTITATIVE, PREGNANCY  LAMOTRIGINE LEVEL  LACTIC ACID, PLASMA  URINALYSIS, ROUTINE W  REFLEX MICROSCOPIC  URINE DRUG SCREEN, QUALITATIVE ([facility] ONLY)  LEVETIRACETAM LEVEL  POC URINE PREG, ED  CBG MONITORING, ED     I ordered and reviewed the above labs they are notable for normal lactic, mild elevated WBC 12  EKG  ED ECG REPORT I, Pilar Jarvis, the attending physician, personally viewed and interpreted this ECG.   Date: 01/25/2022  EKG Time: 1512  Rate: 136  Rhythm: sinus tachycardia  Axis: nl  Intervals:none  ST&T Change: No acute ischemic changes.    RADIOLOGY I independently reviewed and interpreted this x-ray and see no obvious focalities or pneumothorax.   PROCEDURES:  Critical Care performed: Yes, see critical care procedure note(s)  .Critical Care  Performed by: Pilar Jarvis, MD Authorized by: Pilar Jarvis, MD   Critical care provider statement:    Critical care time (minutes):  30   Critical care was necessary to treat or prevent imminent or life-threatening deterioration of the following conditions:  Endocrine crisis   Critical care was time spent personally by me on the following activities:  Development of treatment plan with patient or surrogate, discussions with consultants, evaluation of patient's response to treatment, examination of patient, ordering and review of laboratory studies, ordering and review of radiographic studies, ordering and performing treatments and interventions, pulse oximetry, re-evaluation of patient's condition and review of old charts    MEDICATIONS ORDERED IN ED: Medications  levETIRAcetam (KEPPRA) IVPB 1000 mg/100 mL premix (1,000 mg Intravenous New Bag/Given 01/25/22 1824)  methimazole (TAPAZOLE) tablet 20 mg (has no administration in time range)  propranolol (INDERAL) injection 1 mg (has no administration in time range)  potassium iodide (SSKI) 1 GM/ML solution 250 mg (has no administration in time range)  metoCLOPramide (REGLAN) injection 10 mg (10 mg Intravenous Given 01/25/22 1715)  sodium chloride 0.9 % bolus  1,000 mL (1,000 mLs Intravenous New Bag/Given 01/25/22 1742)  LORazepam (ATIVAN) 2 MG/ML injection (  Given 01/25/22 1742)    External physician / consultants:  I spoke with ICU Dr. Nicanor Bake, neurologist on-call, hospitalist regarding care plan for this patient.   IMPRESSION / MDM / ASSESSMENT AND PLAN / ED COURSE  I reviewed the triage vital signs and the nursing notes.                                Patient's presentation is most consistent with acute presentation with potential threat to life or bodily function.  Differential diagnosis includes, but is not limited to, breakthrough seizures,  status epilepticus less likely and back to baseline on my assessment in the emergency department, DKA, infection, hyperthyroidism, medication underdosing, metabolic derangements   The patient is on the cardiac monitor to evaluate for evidence of arrhythmia and/or significant heart rate changes.  MDM: This is a patient with multiple medical comorbidities including hypothyroid, DKA, seizures all of which can contribute to her presentation today.  Assess for all these etiologies with thyroid labs, basic labs electrolytes for DKA, and seizure breakthrough workup with seizure medication levels, basic labs, pregnancy test, infectious workup, CT scan of the head and neck for both seizures and her complaints of pain today after a fall after procedure.    Workup reveals elevated T4 5+ tachycardic consistent with hyperthyroid without thyroid storm given no AMS or fever.  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  I spoke with ICU doctor regarding thyroid dysfunction, not ICU level of care.  Spoke with neurologist who does not recommend EEG given review of past medical history and activities today not consistent with true  seizures, does not need transfer to [facility] for continuous EEG and can stay in [place].  Advised to observe for seizure-like activity and not give further benzodiazepines or medication management unless true seizure activity noted greater than 10 minutes or hemodynamic instability persistent.  With hospitalist for admission.  I gave propranolol, methimazole, iodine to be given 1 hour after methimazole for thyroid.        FINAL CLINICAL IMPRESSION(S) / ED DIAGNOSES   Final diagnoses:  None     Rx / DC Orders   ED Discharge Orders     None        Note:  This document was prepared using Dragon voice recognition software and may include unintentional dictation errors.    Lucillie Garfinkel, MD 01/25/22 [id number]

## 2022-01-25 NOTE — ED Triage Notes (Signed)
Pt to ED via [facility] for seizures. Post ictal on arrival to scene. Family reported 10 seizures in the past hour. Pt has hx of seizures. Pt reported to have 2 more seizures with EMS. EMS administered 5mg . Versed IM twice. Pt able to stand and ambulated with assistance.

## 2022-01-25 NOTE — H&P (Addendum)
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 Reports of approximately 8-10 seizures over the last 2 days. Typically has 3 to 4/week   Per EMS, patient's family called EMS for reported 10 seizures in the past hour. On EMS arrival, patient was post ictal. Shortly prior to transport to ED, patient had 2 more witnessed seizures and was given 5 mg versed IM x 2.   ED Course: Initial vital signs showed HR of 132 beats/minute, BP 134/72  mm Hg, the RR 20 breaths/minute, and the oxygen saturation 99 % on RA and a temperature of 98.41F (36.9C). Patient c/o nausea and vomiting, headache and neck soreness.   Pertinent Labs/Diagnostics Findings: Chemistry:Na+/ K+:135/3.8  Glucose:286  BUN/Cr.:16/0.35  AST/ALT:45/40 CBC: WBC: 12.6  Other Lab findings:   PCT: negative <0.10 Lactic acid:1.3  COVID PCR: Negative. TSH:<0.010, Free T4:5.22 VBG result:  pO2 51; pCO2 41; pH 7.42;  HCO3 26.6, %O2 Sat 81.  Imaging: CXR>No active disease.  CTH> No acute intracranial abnormality.  Medications Administered: Keppra 1g loading dose, Methimazole 20 mg, Reglan 10 mg, propanolol 1 mg IV, Potassium Iodide 250 mg oral, IL bolus NS   PCCM consulted for admission and further management.  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 rate (!) 27, height 5\' 3"  (1.6 m), weight 77.1 kg, SpO2 100 %.       No intake or output data in the 24 hours ending 01/25/22 1929 Filed Weights   01/25/22 1514  Weight: 77.1 kg   Physical Examination  GENERAL:22 year-old critically ill patient lying in the bed with no acute distress.  EYES: Pupils equal, round, reactive to light and accommodation. No scleral icterus. Extraocular muscles intact.  HEENT: Head atraumatic, normocephalic. Oropharynx and nasopharynx clear.  NECK:  Supple, no jugular venous distention. No thyroid enlargement, no tenderness.  LUNGS: Normal breath sounds bilaterally, no wheezing, rales,rhonchi or crepitation. No use of accessory muscles of respiration.  CARDIOVASCULAR: S1, S2 normal. No murmurs, rubs, or gallops.  ABDOMEN: Soft, nontender, nondistended. Bowel sounds present. No organomegaly or mass.  EXTREMITIES: Atraumatic without swelling or erythema. Full  range of motion is noted to all joints. Muscle strength is 5/5 bilaterally. Capillary refill > 3 seconds in all extremities. Pulses palpable.  NEUROLOGIC:The patient is awake, alert and oriented to person, place, and time with normal speech. Motor function is normal with muscle strength 5/5 bilaterally to upper and lower extremities. Sensation is intact bilaterally. Reflexes 2+ bilaterally. Cranial nerves are intact.  PSYCHIATRIC: Appropriate mood and affect.  SKIN: No  obvious rash, lesion, or ulcer.   Labs/imaging that I havepersonally reviewed  (right click and "Reselect all SmartList Selections" daily)     Labs   CBC: Recent Labs  Lab 01/25/22 1708  WBC 12.6*  NEUTROABS 9.0*  HGB 11.5*  HCT 36.1  MCV 75.1*  PLT 353    Basic Metabolic Panel: Recent Labs  Lab 01/25/22 1708  NA 135  K 3.8  CL 101  CO2 24  GLUCOSE 286*  BUN 16  CREATININE 0.35*  CALCIUM 9.7  MG 2.0  PHOS 4.4   GFR: Estimated Creatinine Clearance: 107.6 mL/min (A) (by C-G formula based on SCr of 0.35 mg/dL (L)). Recent Labs  Lab 01/25/22 1708  WBC 12.6*  LATICACIDVEN 1.3    Liver Function Tests: Recent Labs  Lab 01/25/22 1708  AST 45*  ALT 40  ALKPHOS 120  BILITOT 1.4*  PROT 7.8  ALBUMIN 4.1   No results for input(s): "LIPASE", "AMYLASE" in the last 168 hours. No results for input(s): "AMMONIA" in the last 168 hours.  ABG    Component Value Date/Time   PHART 7.402 12/01/2018 0120   PCO2ART 34.2 12/01/2018 0120   PO2ART 84.0 12/01/2018 0120   HCO3 26.6 01/25/2022 1520   TCO2 29 04/10/2020 1105   ACIDBASEDEF 4.4 (H) 08/06/2019 1335   O2SAT 81 01/25/2022 1520     Coagulation Profile: No results for input(s): "INR", "PROTIME" in the last 168 hours.  Cardiac Enzymes: No results for input(s): "CKTOTAL", "CKMB", "CKMBINDEX", "TROPONINI" in the last 168 hours.  HbA1C: Hgb A1c MFr Bld  Date/Time Value Ref Range Status  01/01/2022 05:19 AM 6.5 (H) 4.8 - 5.6 % Final    Comment:    (NOTE)         Prediabetes: 5.7 - 6.4         Diabetes: >6.4         Glycemic control for adults with diabetes: <7.0   10/28/2021 08:55 AM 6.3 (H) 4.8 - 5.6 % Final    Comment:    (NOTE) Pre diabetes:          5.7%-6.4%  Diabetes:              >6.4%  Glycemic control for   <7.0% adults with diabetes     CBG: No results for input(s): "GLUCAP" in the last 168 hours.  Review of Systems:   Review of Systems  Constitutional:  Positive for malaise/fatigue.  Negative for chills, diaphoresis, fever and weight loss.  HENT: Negative.    Eyes: Negative.   Respiratory: Negative.    Cardiovascular: Negative.   Gastrointestinal:  Negative for abdominal pain, nausea and vomiting.  Genitourinary: Negative.   Musculoskeletal: Negative.   Skin: Negative.   Neurological: Negative.   Psychiatric/Behavioral:  Positive for depression and substance abuse. The patient is nervous/anxious and has insomnia.    Past Medical History  She,  has a past medical history of Acanthosis nigricans, Anxiety, CHF (congestive heart failure) ([place]), Chronic lower back pain, Depression, DKA, type 1 ([place]) (09/13/2018), Dyspepsia, Obesity, Ovarian cyst, Precocious  adrenarche [facility]), Premature baby, Seizures (HCC), and Type II diabetes mellitus (HCC).   Surgical History    Past Surgical History:  Procedure Laterality Date   ABDOMINAL HERNIA REPAIR     "I was a baby"   BIOPSY  10/12/2018   Procedure: BIOPSY;  Surgeon: Lynann Bologna, MD;  Location[facility] ENDOSCOPY;  Service: Endoscopy;;   BIOPSY  02/28/2020   Procedure: BIOPSY;  Surgeon: Shellia Cleverly, DO;  Location: [facility] ENDOSCOPY;  Service: Gastroenterology;;   ESOPHAGOGASTRODUODENOSCOPY (EGD) WITH PROPOFOL N/A 10/12/2018   Procedure: ESOPHAGOGASTRODUODENOSCOPY (EGD) WITH PROPOFOL;  Surgeon: Lynann Bologna, MD;  Location: [facility] ENDOSCOPY;  Service: Endoscopy;  Laterality: N/A;   ESOPHAGOGASTRODUODENOSCOPY (EGD) WITH PROPOFOL N/A 02/28/2020   Procedure: ESOPHAGOGASTRODUODENOSCOPY (EGD) WITH PROPOFOL;  Surgeon: Shellia Cleverly, DO;  Location: [facility] ENDOSCOPY;  Service: Gastroenterology;  Laterality: N/A;   FLEXIBLE SIGMOIDOSCOPY N/A 02/28/2020   Procedure: FLEXIBLE SIGMOIDOSCOPY;  Surgeon: Shellia Cleverly, DO;  Location: [facility] ENDOSCOPY;  Service: Gastroenterology;  Laterality: N/A;   HERNIA REPAIR     LEFT HEART CATH AND CORONARY ANGIOGRAPHY N/A 10/13/2018   Procedure: LEFT HEART CATH AND CORONARY ANGIOGRAPHY;  Surgeon: Kathleene Hazel, MD;  Location: [facility] INVASIVE CV LAB;  Service: Cardiovascular;  Laterality: N/A;   TONSILLECTOMY AND ADENOIDECTOMY     WISDOM TOOTH EXTRACTION  2017     Social History   reports that she has never smoked. She has never used smokeless tobacco. She reports that she does not drink alcohol and does not use drugs.   Family History   Her family history includes Allergic rhinitis in her mother; Asthma in her mother; Cervical cancer in her mother; Colon cancer in her maternal grandfather; Diabetes in her father, mother, paternal grandfather, and paternal grandmother; Eczema in her mother; Hyperlipidemia in her father; Hypertension in her father, maternal grandfather, mother, and paternal aunt; Obesity in her father, mother, paternal grandfather, and paternal grandmother. There is no history of Angioedema, Immunodeficiency, Urticaria, Stomach cancer, or Esophageal cancer.   Allergies Allergies  Allergen Reactions   Oatmeal Anaphylaxis   Tomato Anaphylaxis   Acetaminophen Other (See Comments)    Avoids due to liver   Ibuprofen Other (See Comments)    GI MD said to not take this anymore     Home Medications  Prior to Admission medications   Medication Sig Start Date End Date Taking? Authorizing Provider  FLUoxetine (PROZAC) 20 MG tablet Take 20 mg by mouth daily.    [provider]  furosemide (LASIX) 20 MG tablet Take 20 mg by mouth daily as needed. 09/04/21   [provider]  Glucagon (BAQSIMI TWO PACK) 3 MG/DOSE POWD Place 1 spray into the nose as needed. 08/23/21   Wouk, Wilfred Curtis, MD  insulin glargine (LANTUS) 100 unit/mL SOPN Inject 20 Units into the skin 2 (two) times daily. Patient taking differently: Inject 60 Units into the skin 2 (two) times daily. 08/23/21   Wouk, Wilfred Curtis, MD  lamoTRIgine (LAMICTAL) 100 MG tablet Take 1 tablet (100 mg total) by mouth in the morning, at noon, and at bedtime. 10/05/21 01/03/22  Darlin Priestly, MD  loperamide (IMODIUM) 2 MG  capsule Take 2 capsules (4 mg total) by mouth as needed for diarrhea or loose stools. 08/23/21   Wouk, Wilfred Curtis, MD  losartan (COZAAR) 25 MG tablet Take 1 tablet (25 mg total) by mouth daily. 08/23/21   Wouk, Wilfred Curtis, MD  lubiprostone (AMITIZA) 8 MCG capsule Take 8 mcg by mouth 2 (two) times  daily with a meal.    [provider]  methimazole (TAPAZOLE) 10 MG tablet Take 2 tablets (20 mg total) by mouth 2 (two) times daily. 10/05/21 01/03/22  Enzo Bi, MD  metoCLOPramide (REGLAN) 10 MG tablet Take 1 tablet (10 mg total) by mouth 3 (three) times daily with meals. 05/07/21 05/07/22  Hinda Kehr, MD  ondansetron (ZOFRAN-ODT) 4 MG disintegrating tablet Take 1 tablet (4 mg total) by mouth every 8 (eight) hours as needed. 01/01/22   Acheampong, Warnell Bureau, MD  propranolol (INDERAL) 80 MG tablet Take 1 tablet (80 mg total) by mouth 3 (three) times daily. 10/05/21 01/03/22  Enzo Bi, MD  QUEtiapine (SEROQUEL) 100 MG tablet Take 100 mg by mouth at bedtime. Takes 3 tablets (300 mg) at bedtime for sleep    [provider]  QUEtiapine (SEROQUEL) 300 MG tablet Take 300 mg by mouth at bedtime.    [provider]  QUEtiapine (SEROQUEL) 50 MG tablet Take 50 mg by mouth 2 (two) times daily.    [provider]  prochlorperazine (COMPAZINE) 25 MG suppository PLACE 1 SUPPOSITORY (25 MG TOTAL) RECTALLY EVERY TWELVE HOURS AS NEEDED FOR NAUSEA OR VOMITING. 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 US  Thyroid 08/20/21: Moderately enlarged, mildly heterogeneous thyroid gland with mild diffuse hyperemia. These findings are nonspecific but could be seen with thyroiditis or autoimmune thyroid disorder.   08/21/21: Thyroid antibodies elevated  - Continue home Methimazole 20 mg BID - Continue propanolol 80 mg q6hrs  - Continue Lugol's iodine solution 0.60mL tid - Continue decadron 4mg  q8hrs  - Monitor Hepatic function panel and CBC daily  - Pt had an initial endocrinology appointment scheduled in Dec 2023 unclear if she followed up with them   #Seizure Disorder  Likely breakthrough seizures secondary to medication noncompliance  Prior work up including CTs, EEG negative, per chart and neurology notes seizures are nonepileptic - CTH negative - Check Lamictal levels - Continue outpatient lamictal 100 mg TID - EEG pending  - Seizure precautions - Ativan PRN for seizure breakthrough - Neurology consult, appreciate input, recommend no significant change to home meds.   #Chronic HFpEF (recent Echo with EF of 08/2021 60 to 65%) Does not appear to be volume overloaded - Continuous cardiac monitoring - Maintain MAP greater than 65 - IV Lasix as blood pressure and renal function permits; currently on Lasix 20 mg prn daily - Continue Losartan   #T2DM~insulin dependant  HgbA1c pending  - CBG's Q4Hr; Target range of 140 to 180 - SSI - Follow ICU Hypo/Hyperglycemia protocol - Diabetes coordinator consulted appreciate input    #Abdominal Pain  #Diarrhea  ?Diabetes Gastroparesis -Check UDS - Prn percocet for pain management  - prn Reglan   #Mood Disorder Hx Anxiety and Depression, Insomnia - Continue outpatient seroquel and prozac    Best practice:  Diet:  Oral Pain/Anxiety/Delirium protocol (if indicated): No VAP protocol (if indicated): Not indicated DVT prophylaxis: SCD GI prophylaxis: PPI Glucose control:  SSI Yes Central venous access:  N/A Arterial line:  N/A Foley:   N/A Mobility:  bed rest  PT consulted: N/A Last date of multidisciplinary goals of care discussion [1/20] Code Status:  full code Disposition: ICU   = Goals of Care = Code Status Order: FULL  Primary Emergency Contact: Kaney,[place], Home Phone: [phone] Wishes to pursue full aggressive treatment and intervention options, including CPR and intubation, but goals of care will be addressed on going with family if that should become necessary.  Critical care time: 96 minutes       Rufina Falco DNP, CCRN, FNP-C, AGACNP-BC Acute Care NP [facility] PCCM on call pager [phone]

## 2022-01-25 NOTE — ED Notes (Signed)
Report received. This RN now assuming care.

## 2022-01-25 NOTE — ED Notes (Signed)
Ouma, NP, made aware of soft BP

## 2022-01-25 NOTE — Progress Notes (Signed)
Patient coming in for seizure like activity, found to have undetectable TSH with Free T4 of 5.22  I am asked to comment on need for continuous EEG, with some c/f for functional aspect to her seizures described such as normal lactate right after generalized shaking activity.   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   Recommend  -Supportive care for her episodes  -Benzos only if lasting >10 min with significant vital sign instability (do not treat transient hypoxia from breath holding for example, or transient tachycardia/hypertension)  -Saline stimulus to the eyes can be used to assess for ability to respond to stimuli and help distinguish from epileptic seizure activity -Medical management per ED / CCM -Do not recommend any acute changes to her outpatient regimen at this time -Neurology available if needed   On last inpatient neurology evaluation 04/10/2020:   HPI: Nancy [name] is a 22 y.o. 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 History of present illness is not fully clear but Ms. Bullard communicated with the EDP via typing on her phone that she had been vomiting blood last night with chest pain and abdominal pain. It was communicated that patient activated EMS this morning by herself but she was found down unresponsive by EMS with garbled speech. On arrival, she was found to have right hemibody weakness versus functional weakness with aphasia (patient would not speak to EDP but would text to communicate). A code stroke was activated and patient was taken to CT without obvious  acute intracranial abnormality. With patient's inconsistent physical examination, similar presentations in the past per chart review, and normal initial imaging, the code stroke was cancelled.    On neurology arrival, she was able to state her name and nodded "yes" when asked if she had a seizure. She notes that she has about 30 seizures per month and does follow up with a neurologist outpatient. She endorses taking her medication as prescribed and denies any increases in stress in her home / daily life. 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   Lab Results  Component Value Date   TSH <0.010 (L) 01/25/2022   T3TOTAL 183 (H) 08/23/2021   Current vital signs: BP 113/67   Pulse (!) 137   Temp 98.4 F (36.9 C) (Oral)   Resp (!) 27  Ht 5\' 3"  (1.6 m)   Wt 77.1 kg   SpO2 100%   BMI 30.11 kg/m  Vital signs in last 24 hours: Temp:  [98.4 F (36.9 C)] 98.4 F (36.9 C) (01/20 1512) Pulse Rate:  [131-137] 137 (01/20 1700) Resp:  [20-32] 27 (01/20 1700) BP: (113-148)/(66-81) 113/67 (01/20 1700) SpO2:  [99 %-100 %] 100 % (01/20 1700) Weight:  [77.1 kg] 77.1 kg (01/20 1514)   Current Outpatient Medications  Medication Instructions   FLUoxetine  (PROZAC) 20 mg, Oral, Daily   furosemide (LASIX) 20 mg, Oral, Daily PRN   Glucagon (BAQSIMI TWO PACK) 3 MG/DOSE POWD 1 spray, Nasal, As needed   insulin glargine (LANTUS) 20 Units, Subcutaneous, 2 times daily   lamoTRIgine (LAMICTAL) 100 mg, Oral, 3 times daily   loperamide (IMODIUM) 4 mg, Oral, As needed   losartan (COZAAR) 25 mg, Oral, Daily   lubiprostone (AMITIZA) 8 mcg, Oral, 2 times daily with meals   methimazole (TAPAZOLE) 20 mg, Oral, 2 times daily   metoCLOPramide (REGLAN) 10 mg, Oral, 3 times daily with meals   ondansetron (ZOFRAN-ODT) 4 mg, Oral, Every 8 hours PRN   propranolol (INDERAL) 80 mg, Oral, 3 times daily   QUEtiapine (SEROQUEL) 50 mg, Oral, 2 times daily   QUEtiapine (SEROQUEL) 300 mg, Oral, Daily at bedtime   QUEtiapine (SEROQUEL) 100 mg, Oral, Daily at bedtime, Takes 3 tablets (300 mg) at bedtime for sleep

## 2022-01-26 DIAGNOSIS — E0590 Thyrotoxicosis, unspecified without thyrotoxic crisis or storm: Secondary | ICD-10-CM | POA: Diagnosis not present

## 2022-01-26 DIAGNOSIS — R569 Unspecified convulsions: Secondary | ICD-10-CM | POA: Diagnosis not present

## 2022-01-26 LAB — BASIC METABOLIC PANEL
Anion gap: 8 (ref 5–15)
BUN: 17 mg/dL (ref 6–20)
CO2: 20 mmol/L — ABNORMAL LOW (ref 22–32)
Calcium: 9.1 mg/dL (ref 8.9–10.3)
Chloride: 108 mmol/L (ref 98–111)
Creatinine, Ser: 0.41 mg/dL — ABNORMAL LOW (ref 0.44–1.00)
GFR, Estimated: >60 mL/min (ref >60)
Glucose, Bld: 354 mg/dL — ABNORMAL HIGH (ref 70–99)
Potassium: 4.2 mmol/L (ref 3.5–5.1)
Sodium: 136 mmol/L (ref 135–145)

## 2022-01-26 LAB — URINALYSIS, ROUTINE W REFLEX MICROSCOPIC
Bilirubin Urine: NEGATIVE
Glucose, UA: >=500 mg/dL — AB
Hgb urine dipstick: NEGATIVE
Ketones, ur: NEGATIVE mg/dL
Leukocytes,Ua: NEGATIVE
Nitrite: NEGATIVE
Protein, ur: NEGATIVE mg/dL
Specific Gravity, Urine: 1.013 (ref 1.005–1.030)
pH: 6 (ref 5.0–8.0)

## 2022-01-26 LAB — PREGNANCY, URINE: Preg Test, Ur: NEGATIVE

## 2022-01-26 LAB — CBC
HCT: 33.5 % — ABNORMAL LOW (ref 36.0–46.0)
Hemoglobin: 10.5 g/dL — ABNORMAL LOW (ref 12.0–15.0)
MCH: 24.2 pg — ABNORMAL LOW (ref 26.0–34.0)
MCHC: 31.3 g/dL (ref 30.0–36.0)
MCV: 77.2 fL — ABNORMAL LOW (ref 80.0–100.0)
Platelets: 198 10*3/uL (ref 150–400)
RBC: 4.34 MIL/uL (ref 3.87–5.11)
RDW: 15 % (ref 11.5–15.5)
WBC: 6.8 10*3/uL (ref 4.0–10.5)
nRBC: 0 % (ref 0.0–0.2)

## 2022-01-26 LAB — PHOSPHORUS: Phosphorus: 3.6 mg/dL (ref 2.5–4.6)

## 2022-01-26 LAB — URINE DRUG SCREEN, QUALITATIVE (ARMC ONLY)
Amphetamines, Ur Screen: NOT DETECTED
Barbiturates, Ur Screen: NOT DETECTED
Benzodiazepine, Ur Scrn: POSITIVE — AB
Cannabinoid 50 Ng, Ur ~~LOC~~: POSITIVE — AB
Cocaine Metabolite,Ur ~~LOC~~: NOT DETECTED
MDMA (Ecstasy)Ur Screen: NOT DETECTED
Methadone Scn, Ur: NOT DETECTED
Opiate, Ur Screen: NOT DETECTED
Phencyclidine (PCP) Ur S: NOT DETECTED
Tricyclic, Ur Screen: NOT DETECTED

## 2022-01-26 LAB — CBG MONITORING, ED
Glucose-Capillary: 183 mg/dL — ABNORMAL HIGH (ref 70–99)
Glucose-Capillary: 188 mg/dL — ABNORMAL HIGH (ref 70–99)
Glucose-Capillary: 203 mg/dL — ABNORMAL HIGH (ref 70–99)
Glucose-Capillary: 219 mg/dL — ABNORMAL HIGH (ref 70–99)
Glucose-Capillary: 221 mg/dL — ABNORMAL HIGH (ref 70–99)
Glucose-Capillary: 352 mg/dL — ABNORMAL HIGH (ref 70–99)

## 2022-01-26 LAB — MAGNESIUM: Magnesium: 1.8 mg/dL (ref 1.7–2.4)

## 2022-01-26 MED ORDER — MAGNESIUM SULFATE 2 GM/50ML IV SOLN
2.0000 g | Freq: Once | INTRAVENOUS | Status: AC
  Administered 2022-01-26: 2 g via INTRAVENOUS
  Filled 2022-01-26: qty 50

## 2022-01-26 NOTE — Consult Note (Signed)
Neurology Consultation Reason for Consult: Seizure-like activity Requesting Physician: Erin Fulling  CC: Increased frequency of seizure-like activity  History is obtained from: Patient, chart review, nurse at bedside  HPI: Nancy [name] is a 22 y.o. female with a past medical history significant for anxiety, congestive heart failure, depression, obesity (BMI 30), poorly controlled diabetes with history of DKA, intractable nausea/vomiting with suspected gastroparesis, polysubstance abuse, documented pseudoseizures no clear evidence of any underlying seizure activity, recurrent admissions for acute on chronic hyperthyroidism.  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  Patient reports she does is not aware of her typical episodes therefore can only give limited history about them but she reports that she usually has generalized shaking lasting 10 to 15 minutes.  Of note patient has had inpatient EEG evaluations on 08/08/2019, 01/29/2019, 01/28/2019, 12/01/2018, 08/07/2018, and 06/15/2018 without evidence of seizures or epileptiform activity identified -- evaluation has included long-term EEG monitoring with multiple spells captured  On review of primary care physician note she is on lamotrigine monotherapy  Appreciate pharmacy assistance with review of her fill history, she last filled her lamotrigine in November 2023 for 30-day supply, this is confirmed at bedside with patient when she pulls up her fill history on her phone by logging into her pharmacy app.  Patient reports that she usually gets 60 or 90-day supplies but that she had leftover lamotrigine from prior refills and therefore has not been out of it recently.    She reports she gets Keppra from a different pharmacy [facility]), she is unable to  show me the full history for that but reports her previous primary care physician was ordering it for her and that her current primary care physician is planning to discontinue it  ROS: Limited due to to her acute abdominal pain, pertinent information as above  Past Medical History:  Diagnosis Date   Acanthosis nigricans    Anxiety    CHF (congestive heart failure) (HCC)    Chronic lower back pain    Depression    DKA, type 1 (HCC) 09/13/2018   Dyspepsia    Obesity    Ovarian cyst    pt is not aware of this hx (11/24/2017)   Precocious adrenarche (HCC)    Premature baby    Seizures (HCC)    Type II diabetes mellitus (HCC)    insulin dependant   Past Surgical History:  Procedure Laterality Date   ABDOMINAL HERNIA REPAIR     "I was a baby"   BIOPSY  10/12/2018   Procedure: BIOPSY;  Surgeon: Lynann Bologna, MD;  Location: [facility] ENDOSCOPY;  Service: Endoscopy;;   BIOPSY  02/28/2020   Procedure: BIOPSY;  Surgeon: Shellia Cleverly, DO;  Location: [facility] ENDOSCOPY;  Service: Gastroenterology;;   ESOPHAGOGASTRODUODENOSCOPY (EGD) WITH PROPOFOL N/A 10/12/2018   Procedure: ESOPHAGOGASTRODUODENOSCOPY (EGD) WITH PROPOFOL;  Surgeon: Lynann Bologna, MD;  Location: [facility] ENDOSCOPY;  Service: Endoscopy;  Laterality: N/A;   ESOPHAGOGASTRODUODENOSCOPY (EGD) WITH PROPOFOL N/A 02/28/2020   Procedure: ESOPHAGOGASTRODUODENOSCOPY (EGD) WITH PROPOFOL;  Surgeon: Shellia Cleverly, DO;  Location: [facility] ENDOSCOPY;  Service: Gastroenterology;  Laterality: N/A;   FLEXIBLE SIGMOIDOSCOPY N/A 02/28/2020   Procedure: FLEXIBLE SIGMOIDOSCOPY;  Surgeon: Shellia Cleverly, DO;  Location: [facility] ENDOSCOPY;  Service: Gastroenterology;  Laterality: N/A;   HERNIA REPAIR     LEFT HEART CATH AND CORONARY  ANGIOGRAPHY N/A 10/13/2018   Procedure: LEFT HEART CATH AND CORONARY ANGIOGRAPHY;  Surgeon: Burnell Blanks, MD;  Location: [facility] [facility];  Service: Cardiovascular;  Laterality: N/A;   TONSILLECTOMY AND ADENOIDECTOMY     WISDOM  TOOTH EXTRACTION  2017   Current Outpatient Medications  Medication Instructions   FLUoxetine (PROZAC) 20 mg, Oral, Daily   furosemide (LASIX) 20 mg, Oral, Daily PRN   Glucagon (BAQSIMI TWO PACK) 3 MG/DOSE POWD 1 spray, Nasal, As needed   insulin glargine (LANTUS) 20 Units, Subcutaneous, 2 times daily   lamoTRIgine (LAMICTAL) 100 mg, Oral, 3 times daily   loperamide (IMODIUM) 4 mg, Oral, As needed   losartan (COZAAR) 25 mg, Oral, Daily   lubiprostone (AMITIZA) 8 mcg, Oral, 2 times daily with meals   methimazole (TAPAZOLE) 20 mg, Oral, 2 times daily   metoCLOPramide (REGLAN) 10 mg, Oral, 3 times daily with meals   ondansetron (ZOFRAN-ODT) 4 mg, Oral, Every 8 hours PRN   propranolol (INDERAL) 80 mg, Oral, 3 times daily   QUEtiapine (SEROQUEL) 50 mg, Oral, 2 times daily   QUEtiapine (SEROQUEL) 300 mg, Oral, Daily at bedtime     Family History  Problem Relation Age of Onset   Diabetes Mother    Hypertension Mother    Obesity Mother    Asthma Mother    Allergic rhinitis Mother    Eczema Mother    Cervical cancer Mother    Diabetes Father    Hypertension Father    Obesity Father    Hyperlipidemia Father    Hypertension Paternal Aunt    Hypertension Maternal Grandfather    Colon cancer Maternal Grandfather    Diabetes Paternal Grandmother    Obesity Paternal Grandmother    Diabetes Paternal Grandfather    Obesity Paternal Grandfather    Angioedema Neg Hx    Immunodeficiency Neg Hx    Urticaria Neg Hx    Stomach cancer Neg Hx    Esophageal cancer Neg Hx     Social History:  reports that she has never smoked. She has never used smokeless tobacco. She reports that she does not drink alcohol and does not use drugs.   Exam: Current vital signs: BP (!) 158/76   Pulse (!) 110   Temp 98.8 F (37.1 C)   Resp (!) 24   Ht 5\' 3"  (1.6 m)   Wt 77.1 kg   SpO2 100%   BMI 30.11 kg/m  Vital signs in last 24 hours: Temp:  [98.4 F (36.9 C)-99 F (37.2 C)] 98.8 F (37.1 C)  (01/21 0957) Pulse Rate:  [101-137] 110 (01/21 1000) Resp:  [15-32] 24 (01/21 1000) BP: (104-181)/(56-93) 158/76 (01/21 1000) SpO2:  [94 %-100 %] 100 % (01/21 1000) Weight:  [77.1 kg] 77.1 kg (01/20 1514)   Physical Exam  Constitutional: Constantly writhing in the bed grabbing her stomach, reporting she is in severe pain, emesis bag in hand Psych: Affect appropriate to situation, distressed and mildly anxious Eyes: No scleral injection HENT: No oropharyngeal obstruction.  MSK: no significant joint deformities.  Cardiovascular: Tachycardic, regular rate and, perfusing extremities well Respiratory: Effort normal, non-labored breathing GI: Soft.  She reports significant pain Skin: Warm dry and intact visible skin  Neuro: Mental Status: Patient is awake, alert, oriented to person, place, month, year, and situation. Patient is able to give a clear and coherent history, able to use her phone well to navigate to her fill history for example No signs of aphasia or neglect Cranial Nerves:  II: Visual Fields are full. Pupils are equal, round, and reactive to light.   III,IV, VI: EOMI without ptosis or diploplia.  V: Facial sensation is symmetric to light touch VII: Facial movement is symmetric.  VIII: hearing is intact to voice X: Uvula elevates symmetrically XII: tongue is midline without atrophy or fasciculations.  Motor: Significantly limited due to abdominal pain but no pronator drift bilaterally, able to lift both legs antigravity briefly Sensory: Equally reactive to touch in all 4 extremities Deep Tendon Reflexes: 2+ and symmetric in the brachioradialis, somewhat difficult to elicit right patella, 2+ left patella Cerebellar: FNF and HKS are intact bilaterally Gait:  Deferred   I have reviewed labs in epic and the results pertinent to this consultation are:  Basic Metabolic Panel: Recent Labs  Lab 01/25/22 1708 01/26/22 0350  NA 135 136  K 3.8 4.2  CL 101 108  CO2 24  20*  GLUCOSE 286* 354*  BUN 16 17  CREATININE 0.35* 0.41*  CALCIUM 9.7 9.1  MG 2.0 1.8  PHOS 4.4 3.6    CBC: Recent Labs  Lab 01/25/22 1708 01/26/22 0350  WBC 12.6* 6.8  NEUTROABS 9.0*  --   HGB 11.5* 10.5*  HCT 36.1 33.5*  MCV 75.1* 77.2*  PLT 244 198    Coagulation Studies: No results for input(s): "LABPROT", "INR" in the last 72 hours.   Lab Results  Component Value Date   TSH <0.010 (L) 01/25/2022   T3TOTAL 183 (H) 08/23/2021   Lab Results  Component Value Date   HGBA1C 6.5 (H) 01/01/2022   Lactate 1.3   Latest Reference Range & Units 08/20/20 16:07 05/06/21 17:17 08/06/21 12:01 10/03/21 04:08 10/27/21 17:20 10/28/21 08:55 12/31/21 20:08  Lamotrigine, Serum 2.0 - 20.0 ug/mL <1.0 (L) <1.0 (L) <1.0 (L) <1.0 (L) <1.0 (L) 1.4 (L) 2.1  (L): Data is abnormally low  I have reviewed the images obtained:  Head CT, CT C-spine personally reviewed, agree with radiology: 1. No acute intracranial abnormality. 2. No evidence of acute cervical spine fracture or traumatic subluxation. 3. Reversal of the normal cervical lordosis, may be related to positioning or muscle spasm.  Impression: Events as described by nurse at bedside consistent with psychogenic nonepileptic seizures.  I am unable to find any evidence of concurrent underlying epilepsy, but would not make changes in the acute setting.  While rarely thyrotoxicosis can be associated with seizure, this is typically in the setting of altered mentation and fortunately the patient's mental status is very reassuring on my examination.  It is possible that her hyperthyroidism exacerbation is leading to anxiety that is exacerbating her psychogenic nonepileptic events  Recommendations: -Please collect a UA and UDS that had been ordered previously -Supportive care for her episodes  -Benzos only if lasting >10 min with significant vital sign instability (do not treat transient hypoxia from breath holding for example, or transient  tachycardia/hypertension)  -Saline stimulus to the eyes can be used to assess for ability to respond to stimuli and help distinguish nonepileptic seizures from epileptic seizure activity -Medical management per ED / CCM -Do not recommend any acute changes to her outpatient regimen at this time;  continue lamotrigine 100 3 times daily  hold Keppra unless levels support that she is currently taking this medication (levels were collected prior to her getting a dose here) -Neurology available if needed, please reengage neurology if there is concern for true epileptic activity -Consider psychiatry consult if she continues to have frequent nonepileptic events   Maizee Reinhold  Nemiah Kissner MD-PhD [facility] [phone] [facility] coverage for [facility] is from 8 AM to 4 AM in-house and 4 PM to 8 PM by telephone/video. 8 PM to 8 AM emergent questions or overnight urgent questions should be addressed to Teleneurology On-call or [facility] neurohospitalist; contact information can be found on AMION  Greater than 80 minutes spent in care of this patient today, greater than 50% at bedside

## 2022-01-26 NOTE — ED Notes (Signed)
Pt reports some nausea at this time.

## 2022-01-26 NOTE — ED Notes (Signed)
Pt diaphoretic; denies this being typical for her at night-time; denies SOB, dizziness, nausea; pt's resp reg/unlabored and calmly laying on stretcher currently. Pt requesting something sweet; educated cannot give anything sweet currently due to high blood glucose.

## 2022-01-26 NOTE — Consult Note (Signed)
PHARMACY CONSULT NOTE - FOLLOW UP  Pharmacy Consult for Electrolyte Monitoring and Replacement   Recent Labs: Potassium (mmol/L)  Date Value  01/26/2022 4.2   Magnesium (mg/dL)  Date Value  01/26/2022 1.8   Calcium (mg/dL)  Date Value  01/26/2022 9.1   Albumin (g/dL)  Date Value  01/25/2022 4.1  10/05/2018 4.7   Phosphorus (mg/dL)  Date Value  01/26/2022 3.6   Sodium (mmol/L)  Date Value  01/26/2022 136  10/05/2018 139     Assessment: 21 y.o with significant PMH of T2DM, IDA, dCHF, Seizure disorder on Lamictal, Depression and Anxiety, Polysubstance abuse, and hyperthyroidism with recurrent admission for 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   Lasix 20 mg PO PRN.   Goal of Therapy:  WNL  Plan:  Mg 2 g IV x 1 F/u with AM labs.   Nancy [name] ,PharmD Clinical Pharmacist 01/26/2022 8:02 AM

## 2022-01-26 NOTE — ED Notes (Addendum)
Pt had another one of her pseudo seizures. Rn went into the room and tried to open her eye lids and she woke up out of it. Provider, Stark Klein, aware

## 2022-01-26 NOTE — H&P (Signed)
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 Reports of approximately 8-10 seizures over the last 2 days. Typically has 3 to 4/week   Per EMS, patient's family called EMS for reported 10 seizures in the past hour. On EMS arrival, patient was post ictal. Shortly prior to transport to ED, patient had 2 more witnessed seizures and was given 5 mg versed IM x 2.   ED Course: Initial vital signs showed HR of 132 beats/minute, BP 134/72  mm Hg, the RR 20 breaths/minute, and the oxygen saturation 99 % on RA and a temperature of 98.61F (36.9C). Patient c/o nausea and vomiting, headache and neck soreness.   Pertinent Labs/Diagnostics Findings: Chemistry:Na+/ K+:135/3.8  Glucose:286  BUN/Cr.:16/0.35  AST/ALT:45/40 CBC: WBC: 12.6  Other Lab findings:   PCT: negative <0.10 Lactic acid:1.3  COVID PCR: Negative. TSH:<0.010, Free T4:5.22 VBG result:  pO2 51; pCO2 41; pH 7.42;  HCO3 26.6, %O2 Sat 81.  Imaging: CXR>[name] active disease.  CTH> [name] acute intracranial abnormality.  Medications Administered: Keppra 1g loading dose, Methimazole 20 mg, Reglan 10 mg, propanolol 1 mg IV, Potassium Iodide 250 mg oral, IL bolus NS   PCCM consulted for admission and further management.  Past Medical History    Acanthosis nigricans     Anxiety     CHF (congestive heart failure) (HCC)     Chronic lower back pain     Depression     DKA, type 1 (HCC) 09/13/2018   Dyspepsia     Obesity     Ovarian cyst      pt is not aware of this hx (11/24/2017)   Precocious adrenarche (HCC)      Premature baby     Seizures (HCC)     Type II diabetes mellitus (HCC) Hyperthyroidism    Significant Hospital Events   1/20; Admit to ICU with breakthrough seizures in the setting of hyperthyroid state 1/21 patient asymptomatic at this time, alert and awake NAD  Consults:  Neurology  Procedures:  None  Significant Diagnostic Tests:  1/20: Chest Xray> 1/20: Noncontrast CT head>  Micro Data:  1/20: SARS-CoV-2 PCR> negative 1/20: Influenza PCR> negative  Antimicrobials:  None     SUBJECTIVE   [name] symptoms at this time [name] CHEST PAIN [name] SOB On meds for Hyperthyroid    OBJECTIVE  Blood pressure (!) 142/77, pulse (!) 104, temperature 98.9 F (37.2 C), resp. rate (!) 24, height 5\' 3"  (1.6 m), weight 77.1 kg, SpO2 99 %.        Intake/Output Summary (Last 24 hours) at 01/26/2022 0816 Last data filed at 01/25/2022 2311 Gross per 24 hour  Intake 500 ml  Output --  Net 500 ml   Filed Weights   01/25/22 1514  Weight: 77.1 kg      Review of Systems: Gen:  Denies  fever, sweats, chills weight loss  HEENT: Denies blurred vision, double vision, ear pain, eye pain, hearing loss, nose bleeds, sore throat Cardiac:  [name] dizziness, chest pain or heaviness, chest tightness,edema, [name] JVD Resp:   [name] cough, -sputum production, -  shortness of breath,-wheezing, -hemoptysis,  Other:  All other systems negative    Physical Examination:   General Appearance: [name] distress  EYES PERRLA, EOM intact.   NECK Supple, [name] JVD Pulmonary: normal breath sounds, [name] wheezing.  CardiovascularNormal S1,S2.  [name] m/r/g.   Abdomen: Benign, Soft, non-tender. ALL OTHER ROS ARE NEGATIVE    Labs/imaging that I havepersonally reviewed  (right click and "Reselect all SmartList Selections" daily)     Labs   CBC: Recent Labs  Lab 01/25/22 1708 01/26/22 0350  WBC 12.6* 6.8  NEUTROABS 9.0*  --   HGB 11.5* 10.5*  HCT 36.1 33.5*  MCV 75.1* 77.2*  PLT 244 198     Basic Metabolic  Panel: Recent Labs  Lab 01/25/22 1708 01/26/22 0350  NA 135 136  K 3.8 4.2  CL 101 108  CO2 24 20*  GLUCOSE 286* 354*  BUN 16 17  CREATININE 0.35* 0.41*  CALCIUM 9.7 9.1  MG 2.0 1.8  PHOS 4.4 3.6    GFR: Estimated Creatinine Clearance: 107.6 mL/min (A) (by C-G formula based on SCr of 0.41 mg/dL (L)). Recent Labs  Lab 01/25/22 1708 01/26/22 0350  WBC 12.6* 6.8  LATICACIDVEN 1.3  --      Liver Function Tests: Recent Labs  Lab 01/25/22 1708  AST 45*  ALT 40  ALKPHOS 120  BILITOT 1.4*  PROT 7.8  ALBUMIN 4.1    [name] results for input(s): "LIPASE", "AMYLASE" in the last 168 hours. [name] results for input(s): "AMMONIA" in the last 168 hours.  ABG    Component Value Date/Time   PHART 7.402 12/01/2018 0120   PCO2ART 34.2 12/01/2018 0120   PO2ART 84.0 12/01/2018 0120   HCO3 26.6 01/25/2022 1520   TCO2 29 04/10/2020 1105   ACIDBASEDEF 4.4 (H) 08/06/2019 1335   O2SAT 81 01/25/2022 1520     Coagulation Profile: [name] results for input(s): "INR", "PROTIME" in the last 168 hours.  Cardiac Enzymes: [name] results for input(s): "CKTOTAL", "CKMB", "CKMBINDEX", "TROPONINI" in the last 168 hours.  HbA1C: Hgb A1c MFr Bld  Date/Time Value Ref Range Status  01/01/2022 05:19 AM 6.5 (H) 4.8 - 5.6 % Final    Comment:    (NOTE)         Prediabetes: 5.7 - 6.4         Diabetes: >6.4         Glycemic control for adults with diabetes: <7.0   10/28/2021 08:55 AM 6.3 (H) 4.8 - 5.6 % Final    Comment:    (NOTE) Pre diabetes:          5.7%-6.4%  Diabetes:              >6.4%  Glycemic control for   <7.0% adults with diabetes     CBG: Recent Labs  Lab 01/25/22 2206 01/26/22 0342 01/26/22 0717  GLUCAP 187* 352* 183*    Surgical History    Past Surgical History:  Procedure Laterality Date   ABDOMINAL HERNIA REPAIR     "I was a baby"   BIOPSY  10/12/2018   Procedure: BIOPSY;  Surgeon: Lynann Bologna, MD;  Location: [facility] ENDOSCOPY;  Service: Endoscopy;;   BIOPSY  02/28/2020    Procedure: BIOPSY;  Surgeon: Shellia Cleverly, DO;  Location: [facility] ENDOSCOPY;  Service: Gastroenterology;;   ESOPHAGOGASTRODUODENOSCOPY (EGD) WITH PROPOFOL N/A 10/12/2018   Procedure: ESOPHAGOGASTRODUODENOSCOPY (EGD) WITH PROPOFOL;  Surgeon: Lynann Bologna, MD;  Location: [facility] ENDOSCOPY;  Service: Endoscopy;  Laterality: N/A;   ESOPHAGOGASTRODUODENOSCOPY (EGD)  WITH PROPOFOL N/A 02/28/2020   Procedure: ESOPHAGOGASTRODUODENOSCOPY (EGD) WITH PROPOFOL;  Surgeon: Lavena Bullion, DO;  Location: [facility];  Service: Gastroenterology;  Laterality: N/A;   FLEXIBLE SIGMOIDOSCOPY N/A 02/28/2020   Procedure: FLEXIBLE SIGMOIDOSCOPY;  Surgeon: Lavena Bullion, DO;  Location: [facility];  Service: Gastroenterology;  Laterality: N/A;   HERNIA REPAIR     LEFT HEART CATH AND CORONARY ANGIOGRAPHY N/A 10/13/2018   Procedure: LEFT HEART CATH AND CORONARY ANGIOGRAPHY;  Surgeon: Burnell Blanks, MD;  Location: [facility];  Service: Cardiovascular;  Laterality: N/A;   TONSILLECTOMY AND ADENOIDECTOMY     WISDOM TOOTH EXTRACTION  2017     Social History   reports that she has never smoked. She has never used smokeless tobacco. She reports that she does not drink alcohol and does not use drugs.   Family History   Her family history includes Allergic rhinitis in her mother; Asthma in her mother; Cervical cancer in her mother; Colon cancer in her maternal grandfather; Diabetes in her father, mother, paternal grandfather, and paternal grandmother; Eczema in her mother; Hyperlipidemia in her father; Hypertension in her father, maternal grandfather, mother, and paternal aunt; Obesity in her father, mother, paternal grandfather, and paternal grandmother. There is [name] history of Angioedema, Immunodeficiency, Urticaria, Stomach cancer, or Esophageal cancer.   Allergies Allergies  Allergen Reactions   Oatmeal Anaphylaxis   Tomato Anaphylaxis   Acetaminophen Other (See Comments)    Avoids due to liver    Ibuprofen Other (See Comments)    GI MD said to not take this anymore     Home Medications  Prior to Admission medications   Medication Sig Start Date End Date Taking? Authorizing Provider  FLUoxetine (PROZAC) 20 MG tablet Take 20 mg by mouth daily.    [provider]  furosemide (LASIX) 20 MG tablet Take 20 mg by mouth daily as needed. 09/04/21   [provider]  Glucagon (BAQSIMI TWO PACK) 3 MG/DOSE POWD Place 1 spray into the nose as needed. 08/23/21   Wouk, Ailene Rud, MD  insulin glargine (LANTUS) 100 unit/mL SOPN Inject 20 Units into the skin 2 (two) times daily. Patient taking differently: Inject 60 Units into the skin 2 (two) times daily. 08/23/21   Wouk, Ailene Rud, MD  lamoTRIgine (LAMICTAL) 100 MG tablet Take 1 tablet (100 mg total) by mouth in the morning, at noon, and at bedtime. 10/05/21 01/03/22  Enzo Bi, MD  loperamide (IMODIUM) 2 MG capsule Take 2 capsules (4 mg total) by mouth as needed for diarrhea or loose stools. 08/23/21   Wouk, Ailene Rud, MD  losartan (COZAAR) 25 MG tablet Take 1 tablet (25 mg total) by mouth daily. 08/23/21   Wouk, Ailene Rud, MD  lubiprostone (AMITIZA) 8 MCG capsule Take 8 mcg by mouth 2 (two) times daily with a meal.    [provider]  methimazole (TAPAZOLE) 10 MG tablet Take 2 tablets (20 mg total) by mouth 2 (two) times daily. 10/05/21 01/03/22  Enzo Bi, MD  metoCLOPramide (REGLAN) 10 MG tablet Take 1 tablet (10 mg total) by mouth 3 (three) times daily with meals. 05/07/21 05/07/22  Hinda Kehr, MD  ondansetron (ZOFRAN-ODT) 4 MG disintegrating tablet Take 1 tablet (4 mg total) by mouth every 8 (eight) hours as needed. 01/01/22   Acheampong, Warnell Bureau, MD  propranolol (INDERAL) 80 MG tablet Take 1 tablet (80 mg total) by mouth 3 (three) times daily. 10/05/21 01/03/22  Enzo Bi, MD  QUEtiapine (SEROQUEL) 100 MG  tablet Take 100 mg by mouth at bedtime. Takes 3 tablets (300 mg) at bedtime for sleep    [provider]   QUEtiapine (SEROQUEL) 300 MG tablet Take 300 mg by mouth at bedtime.    [provider]  QUEtiapine (SEROQUEL) 50 MG tablet Take 50 mg by mouth 2 (two) times daily.    [provider]  prochlorperazine (COMPAZINE) 25 MG suppository PLACE 1 SUPPOSITORY (25 MG TOTAL) RECTALLY EVERY TWELVE HOURS AS NEEDED FOR NAUSEA OR VOMITING. 02/29/20 02/29/20  Ghimire, Henreitta Leber, MD    Scheduled Meds:  dexamethasone (DECADRON) injection  4 mg Intravenous Q8H   insulin aspart  0-20 Units Subcutaneous Q4H   Iodine Strong (Lugols)  0.2 mL Oral TID   lamoTRIgine  100 mg Oral TID   methimazole  20 mg Oral BID   propranolol  80 mg Oral TID   QUEtiapine  300 mg Oral QHS   Continuous Infusions:  magnesium sulfate bolus IVPB     PRN Meds:.docusate sodium, furosemide, metoCLOPramide (REGLAN) injection, ondansetron (ZOFRAN) IV, polyethylene glycol   Active Hospital Problem list     Assessment & Plan:  #HYPERTHYROIDISM  medication noncompliance  Hx of hyperthyroidism with recurrent episodes. US Thyroid 08/20/21: Moderately enlarged, mildly heterogeneous thyroid gland with mild diffuse hyperemia. These findings are nonspecific but could be seen with thyroiditis or autoimmune thyroid disorder.   08/21/21: Thyroid antibodies elevated  - Continue home Methimazole 20 mg BID - Continue propanolol 80 mg q6hrs  - Continue Lugol's iodine solution 0.35mL tid - Continue decadron 4mg  q8hrs  - Monitor Hepatic function panel and CBC daily  - Pt had an initial endocrinology appointment scheduled in Dec 2023 unclear if she followed up with them   #Seizure Disorder  Likely PSEUDO  seizures secondary to medication noncompliance  Prior work up including CTs, EEG negative, per chart and neurology notes seizures are nonepileptic - CTH negative - Seizure precautions - Ativan PRN for seizure breakthrough - Neurology consult, appreciate input, recommend [name] significant change to home meds.  -Supportive care for  her episodes  -Benzos only if lasting >10 min with significant vital sign instability (do not treat transient hypoxia from breath holding for example, or transient tachycardia/hypertension)  -Saline stimulus to the eyes can be used to assess for ability to respond to stimuli and help distinguish from epileptic seizure activity    #Chronic HFpEF (recent Echo with EF of 08/2021 60 to 65%) Does not appear to be volume overloaded - Continuous cardiac monitoring - Maintain MAP greater than 65 - IV Lasix as blood pressure and renal function permits; currently on Lasix 20 mg prn daily - Continue Losartan   #T2DM~insulin dependant  HgbA1c pending  - CBG's Q4Hr; Target range of 140 to 180 - SSI - Follow ICU Hypo/Hyperglycemia protocol - Diabetes coordinator consulted appreciate input     TRANSFER/ADMIT TO PCU  TRANSFER TO TRH    Maretta Bees Patricia Pesa, M.D.  [facility]  Medical Director [place] Director [place] Department

## 2022-01-27 DIAGNOSIS — E0591 Thyrotoxicosis, unspecified with thyrotoxic crisis or storm: Secondary | ICD-10-CM

## 2022-01-27 LAB — BASIC METABOLIC PANEL
Anion gap: 8 (ref 5–15)
BUN: 18 mg/dL (ref 6–20)
CO2: 23 mmol/L (ref 22–32)
Calcium: 9.5 mg/dL (ref 8.9–10.3)
Chloride: 103 mmol/L (ref 98–111)
Creatinine, Ser: 0.56 mg/dL (ref 0.44–1.00)
GFR, Estimated: >60 mL/min (ref >60)
Glucose, Bld: 248 mg/dL — ABNORMAL HIGH (ref 70–99)
Potassium: 4.3 mmol/L (ref 3.5–5.1)
Sodium: 134 mmol/L — ABNORMAL LOW (ref 135–145)

## 2022-01-27 LAB — CBG MONITORING, ED
Glucose-Capillary: 190 mg/dL — ABNORMAL HIGH (ref 70–99)
Glucose-Capillary: 227 mg/dL — ABNORMAL HIGH (ref 70–99)

## 2022-01-27 LAB — LEVETIRACETAM LEVEL: Levetiracetam Lvl: <2 ug/mL — ABNORMAL LOW (ref 10.0–40.0)

## 2022-01-27 LAB — MAGNESIUM: Magnesium: 2.1 mg/dL (ref 1.7–2.4)

## 2022-01-27 MED ORDER — QUETIAPINE FUMARATE 50 MG PO TABS: 50.0000 mg | ORAL_TABLET | Freq: Every day | ORAL | Status: DC

## 2022-01-27 NOTE — ED Notes (Signed)
Pt up to restroom. Steady. In NAD.

## 2022-01-27 NOTE — Discharge Summary (Signed)
Physician Discharge Summary   Patient: Nancy [name] MRN: [id number] DOB: June 14, 2000  Admit date:     01/25/2022  Discharge date: 01/27/22  Discharge Physician: Lurene Shadow   PCP: Lucienne Minks, FNP   Recommendations at discharge:   Follow-up with PCP in 1 week Follow-up with your neurologist and endocrinologist as soon as possible  Discharge Diagnoses: Principal Problem:   Hyperthyroidism with storm Active Problems:   Hyperthyroidism  Resolved Problems:   * No resolved hospital problems. [facility] Course:  [name] is a 22 year old woman with medical history significant for type II DM on insulin, iron deficiency anemia, chronic diastolic CHF, seizure disorder on Lamictal, depression, anxiety, polysubstance use disorder, hypothyroidism with recurrent admission for acute on chronic hypothyroidism with features of tenderness down.  She presented to the hospital with complaints of seizures.  She reported about 8-10 seizures over the 2 days Her family had called EMS because patient had reportedly had about 10 seizures in an hour.    She was admitted to the hospital for thyroid storm/hyperthyroidism under the critical care team.  She was treated with methimazole, propranolol, Lugol's iodine solution and IV dexamethasone.  She was also seen by the neurologist for reported seizures.  CT head and EEG were unremarkable.  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  No changes were made to her home seizure medicines.  Her condition has improved significantly.  She feels she is at baseline.  She is deemed stable for discharge to home today.  We went through her discharge medications together.  The importance of medical adherence was emphasized.         Consultants: Neurologist, intensivist Procedures performed: None Disposition: Home Diet recommendation:  Discharge Diet Orders (From admission, onward)     Start     Ordered    01/27/22 0000  Diet - low sodium heart healthy        01/27/22 0820           Cardiac and Carb modified diet DISCHARGE MEDICATION: Allergies as of 01/27/2022       Reactions   Oatmeal Anaphylaxis   Tomato Anaphylaxis   Acetaminophen Other (See Comments)   Avoids due to liver   Ibuprofen Other (See Comments)   GI MD said to not take this anymore        Medication List     STOP taking these medications    FLUoxetine 20 MG tablet Commonly known as: PROZAC       TAKE these medications    Baqsimi Two Pack 3 MG/DOSE Powd Generic drug: Glucagon Place 1 spray into the nose as needed.   furosemide 20 MG tablet Commonly known as: LASIX Take 20 mg by mouth daily as needed for fluid or edema.   insulin glargine 100 unit/mL Sopn Commonly known as: LANTUS Inject 20 Units into the skin 2 (two) times daily. What changed: how much to take   lamoTRIgine 100 MG tablet Commonly known as: LAMICTAL Take 1 tablet (100 mg total) by mouth in the morning, at noon, and at bedtime.   loperamide 2 MG capsule Commonly known as: IMODIUM Take 2 capsules (4 mg total) by mouth as needed for diarrhea or loose stools.   losartan 25 MG tablet Commonly known as: COZAAR Take 1 tablet (25 mg total) by mouth daily.   lubiprostone 8 MCG capsule Commonly known as: AMITIZA Take 8 mcg by mouth 2 (two) times daily with a meal.   methimazole 10  MG tablet Commonly known as: TAPAZOLE Take 2 tablets (20 mg total) by mouth 2 (two) times daily.   metoCLOPramide 10 MG tablet Commonly known as: REGLAN Take 1 tablet (10 mg total) by mouth 3 (three) times daily with meals.   ondansetron 4 MG disintegrating tablet Commonly known as: ZOFRAN-ODT Take 1 tablet (4 mg total) by mouth every 8 (eight) hours as needed.   propranolol 80 MG tablet Commonly known as: INDERAL Take 1 tablet (80 mg total) by mouth 3 (three) times daily.   QUEtiapine 100 MG tablet Commonly known as: SEROQUEL Take 300 mg by  mouth at bedtime. What changed: Another medication with the same name was changed. Make sure you understand how and when to take each.   QUEtiapine 50 MG tablet Commonly known as: SEROQUEL Take 1 tablet (50 mg total) by mouth daily. What changed: when to take this        Discharge Exam: Filed Weights   01/25/22 1514  Weight: 77.1 kg   GEN: NAD SKIN: Warm and dry EYES: EOMI ENT: MMM CV: RRR PULM: CTA B ABD: soft, obese, NT, +BS CNS: AAO x 3, non focal EXT: No edema or tenderness   Condition at discharge: good  The results of significant diagnostics from this hospitalization (including imaging, microbiology, ancillary and laboratory) are listed below for reference.   Imaging Studies: CT Head Wo Contrast  Result Date: 01/25/2022 CLINICAL DATA:  Altered mental status.  Seizure. EXAM: CT HEAD WITHOUT CONTRAST CT CERVICAL SPINE WITHOUT CONTRAST TECHNIQUE: Multidetector CT imaging of the head and cervical spine was performed following the standard protocol without intravenous contrast. Multiplanar CT image reconstructions of the cervical spine were also generated. RADIATION DOSE REDUCTION: This exam was performed according to the departmental dose-optimization program which includes automated exposure control, adjustment of the mA and/or kV according to patient size and/or use of iterative reconstruction technique. COMPARISON:  Brain CT 12/31/2021 FINDINGS: CT HEAD FINDINGS Brain: No evidence of acute infarction, hemorrhage, hydrocephalus, extra-axial collection or mass lesion/mass effect. Vascular: No hyperdense vessel or unexpected calcification. Skull: Normal. Negative for fracture or focal lesion. Sinuses/Orbits: No acute finding. Other: None. CT CERVICAL SPINE FINDINGS Alignment: Reversal of the normal cervical lordosis. Skull base and vertebrae: No acute fracture. No primary bone lesion or focal pathologic process. Soft tissues and spinal canal: No prevertebral fluid or swelling. No  visible canal hematoma. Disc levels:  No acute fracture Upper chest: Negative. Other: None IMPRESSION: 1. No acute intracranial abnormality. 2. No evidence of acute cervical spine fracture or traumatic subluxation. 3. Reversal of the normal cervical lordosis, may be related to positioning or muscle spasm. Electronically Signed   By: Lovey Newcomer M.D.   On: 01/25/2022 17:58   CT Cervical Spine Wo Contrast  Result Date: 01/25/2022 CLINICAL DATA:  Altered mental status.  Seizure. EXAM: CT HEAD WITHOUT CONTRAST CT CERVICAL SPINE WITHOUT CONTRAST TECHNIQUE: Multidetector CT imaging of the head and cervical spine was performed following the standard protocol without intravenous contrast. Multiplanar CT image reconstructions of the cervical spine were also generated. RADIATION DOSE REDUCTION: This exam was performed according to the departmental dose-optimization program which includes automated exposure control, adjustment of the mA and/or kV according to patient size and/or use of iterative reconstruction technique. COMPARISON:  Brain CT 12/31/2021 FINDINGS: CT HEAD FINDINGS Brain: No evidence of acute infarction, hemorrhage, hydrocephalus, extra-axial collection or mass lesion/mass effect. Vascular: No hyperdense vessel or unexpected calcification. Skull: Normal. Negative for fracture or focal lesion. Sinuses/Orbits: No acute  finding. Other: None. CT CERVICAL SPINE FINDINGS Alignment: Reversal of the normal cervical lordosis. Skull base and vertebrae: No acute fracture. No primary bone lesion or focal pathologic process. Soft tissues and spinal canal: No prevertebral fluid or swelling. No visible canal hematoma. Disc levels:  No acute fracture Upper chest: Negative. Other: None IMPRESSION: 1. No acute intracranial abnormality. 2. No evidence of acute cervical spine fracture or traumatic subluxation. 3. Reversal of the normal cervical lordosis, may be related to positioning or muscle spasm. Electronically Signed   By:  Lovey Newcomer M.D.   On: 01/25/2022 17:58   DG Chest 1 View  Result Date: 01/25/2022 CLINICAL DATA:  sz ams EXAM: CHEST  1 VIEW COMPARISON:  Chest x-ray October 27, 2021. FINDINGS: The heart size and mediastinal contours are within normal limits. Both lungs are clear. No visible pleural effusions or pneumothorax. No acute osseous abnormality. IMPRESSION: No active disease. Electronically Signed   By: Margaretha Sheffield M.D.   On: 01/25/2022 15:49   CT Head Wo Contrast  Result Date: 12/31/2021 CLINICAL DATA:  Seizures. EXAM: CT HEAD WITHOUT CONTRAST TECHNIQUE: Contiguous axial images were obtained from the base of the skull through the vertex without intravenous contrast. RADIATION DOSE REDUCTION: This exam was performed according to the departmental dose-optimization program which includes automated exposure control, adjustment of the mA and/or kV according to patient size and/or use of iterative reconstruction technique. COMPARISON:  CT Head 08/06/21 FINDINGS: Brain: No evidence of acute infarction, hemorrhage, hydrocephalus, extra-axial collection or mass lesion/mass effect. Vascular: No hyperdense vessel or unexpected calcification. Skull: Normal. Negative for fracture or focal lesion. Sinuses/Orbits: No acute finding. Other: None. IMPRESSION: No acute intracranial pathology.  No seizure focus identified. Electronically Signed   By: Marin Roberts M.D.   On: 12/31/2021 12:13    Microbiology: Results for orders placed or performed during the hospital encounter of 01/25/22  Resp panel by RT-PCR (RSV, Flu A&B, Covid) Anterior Nasal Swab     Status: None   Collection Time: 01/25/22  3:26 PM   Specimen: Anterior Nasal Swab  Result Value Ref Range Status   SARS Coronavirus 2 by RT PCR NEGATIVE NEGATIVE Final    Comment: (NOTE) SARS-CoV-2 target nucleic acids are NOT DETECTED.  The SARS-CoV-2 RNA is generally detectable in upper respiratory specimens during the acute phase of infection. The  lowest concentration of SARS-CoV-2 viral copies this assay can detect is 138 copies/mL. A negative result does not preclude SARS-Cov-2 infection and should not be used as the sole basis for treatment or other patient management decisions. A negative result may occur with  improper specimen collection/handling, submission of specimen other than nasopharyngeal swab, presence of viral mutation(s) within the areas targeted by this assay, and inadequate number of viral copies(<138 copies/mL). A negative result must be combined with clinical observations, patient history, and epidemiological information. The expected result is Negative.  Fact Sheet for Patients:  EntrepreneurPulse.com.au  Fact Sheet for Healthcare Providers:  IncredibleEmployment.be  This test is no t yet approved or cleared by the [place] FDA and  has been authorized for detection and/or diagnosis of SARS-CoV-2 by FDA under an Emergency Use Authorization (EUA). This EUA will remain  in effect (meaning this test can be used) for the duration of the COVID-19 declaration under Section 564(b)(1) of the Act, 21 U.S.C.section 360bbb-3(b)(1), unless the authorization is terminated  or revoked sooner.       Influenza A by PCR NEGATIVE NEGATIVE Final   Influenza B by PCR  NEGATIVE NEGATIVE Final    Comment: (NOTE) The Xpert Xpress SARS-CoV-2/FLU/RSV plus assay is intended as an aid in the diagnosis of influenza from Nasopharyngeal swab specimens and should not be used as a sole basis for treatment. Nasal washings and aspirates are unacceptable for Xpert Xpress SARS-CoV-2/FLU/RSV testing.  Fact Sheet for Patients: BloggerCourse.com  Fact Sheet for Healthcare Providers: SeriousBroker.it  This test is not yet approved or cleared by the [place] FDA and has been authorized for detection and/or diagnosis of SARS-CoV-2 by FDA under  an Emergency Use Authorization (EUA). This EUA will remain in effect (meaning this test can be used) for the duration of the COVID-19 declaration under Section 564(b)(1) of the Act, 21 U.S.C. section 360bbb-3(b)(1), unless the authorization is terminated or revoked.     Resp Syncytial Virus by PCR NEGATIVE NEGATIVE Final    Comment: (NOTE) Fact Sheet for Patients: BloggerCourse.com  Fact Sheet for Healthcare Providers: SeriousBroker.it  This test is not yet approved or cleared by the [place] FDA and has been authorized for detection and/or diagnosis of SARS-CoV-2 by FDA under an Emergency Use Authorization (EUA). This EUA will remain in effect (meaning this test can be used) for the duration of the COVID-19 declaration under Section 564(b)(1) of the Act, 21 U.S.C. section 360bbb-3(b)(1), unless the authorization is terminated or revoked.  Performed at [facility], [address] [place], [place] [address]     Labs: CBC: Recent Labs  Lab 01/25/22 1708 01/26/22 0350  WBC 12.6* 6.8  NEUTROABS 9.0*  --   HGB 11.5* 10.5*  HCT 36.1 33.5*  MCV 75.1* 77.2*  PLT 244 198   Basic Metabolic Panel: Recent Labs  Lab 01/25/22 1708 01/26/22 0350  NA 135 136  K 3.8 4.2  CL 101 108  CO2 24 20*  GLUCOSE 286* 354*  BUN 16 17  CREATININE 0.35* 0.41*  CALCIUM 9.7 9.1  MG 2.0 1.8  PHOS 4.4 3.6   Liver Function Tests: Recent Labs  Lab 01/25/22 1708  AST 45*  ALT 40  ALKPHOS 120  BILITOT 1.4*  PROT 7.8  ALBUMIN 4.1   CBG: Recent Labs  Lab 01/26/22 1627 01/26/22 2033 01/26/22 2338 01/27/22 0555 01/27/22 0812  GLUCAP 188* 203* 219* 190* 227*    Discharge time spent: greater than 30 minutes.  Signed: Lurene Shadow, MD [facility] 01/27/2022

## 2022-01-27 NOTE — ED Notes (Signed)
Pt back to bed; bed locked low; rail up; call bell within reach.

## 2022-01-29 LAB — LAMOTRIGINE LEVEL: Lamotrigine Lvl: <1 ug/mL — ABNORMAL LOW (ref 2.0–20.0)

## 2022-02-08 IMAGING — CR DG CHEST 2V
1 series · 2 of 2 positions shown · non-contrast
Comparison: Chest x-ray dated October 31, 2020

CLINICAL DATA: Chest pain

EXAM:
CHEST - 2 VIEW

[Series 1: dg chest 2 view · 0.14mm/px · 2 of 2 slices shown]
[im 1/2]
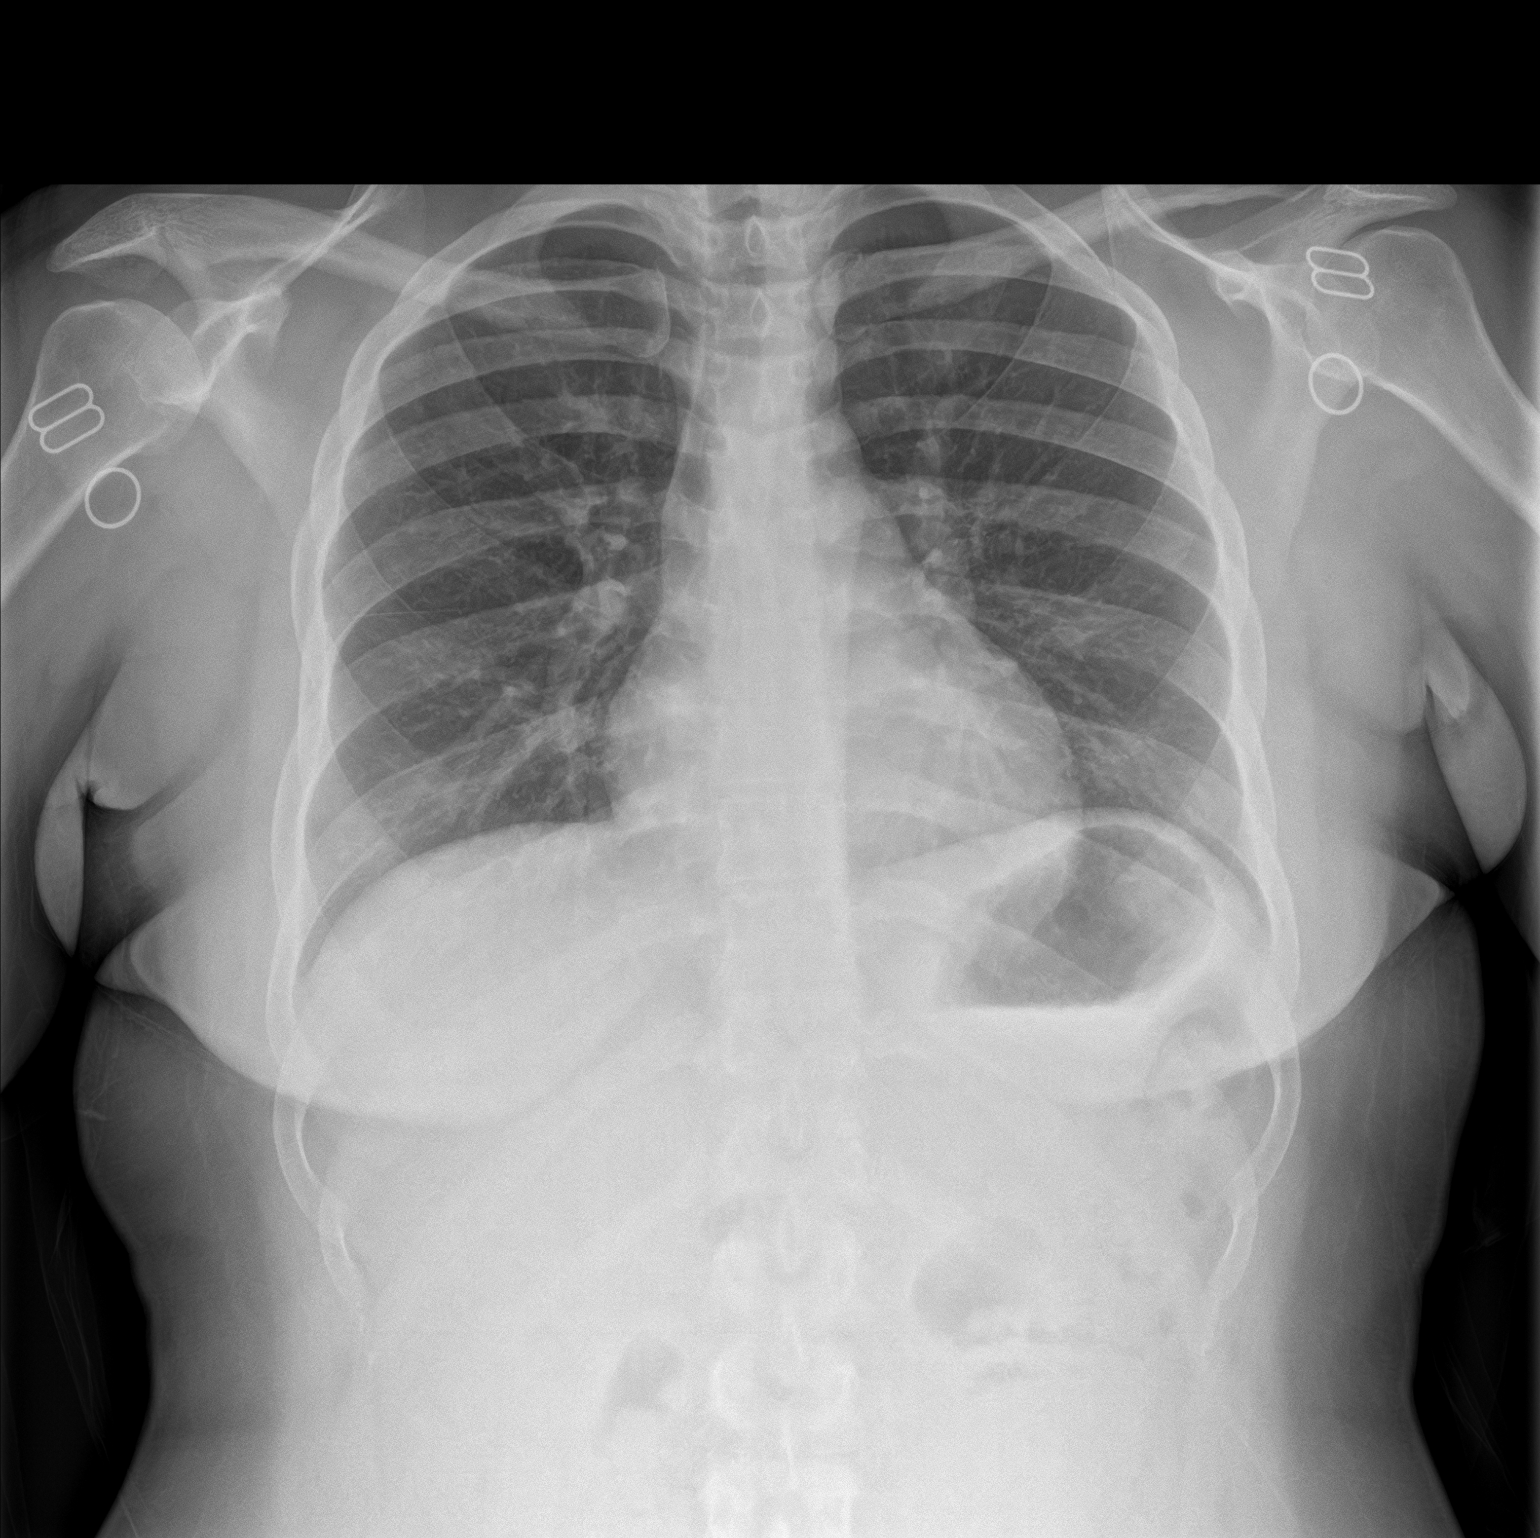
[im 2/2]
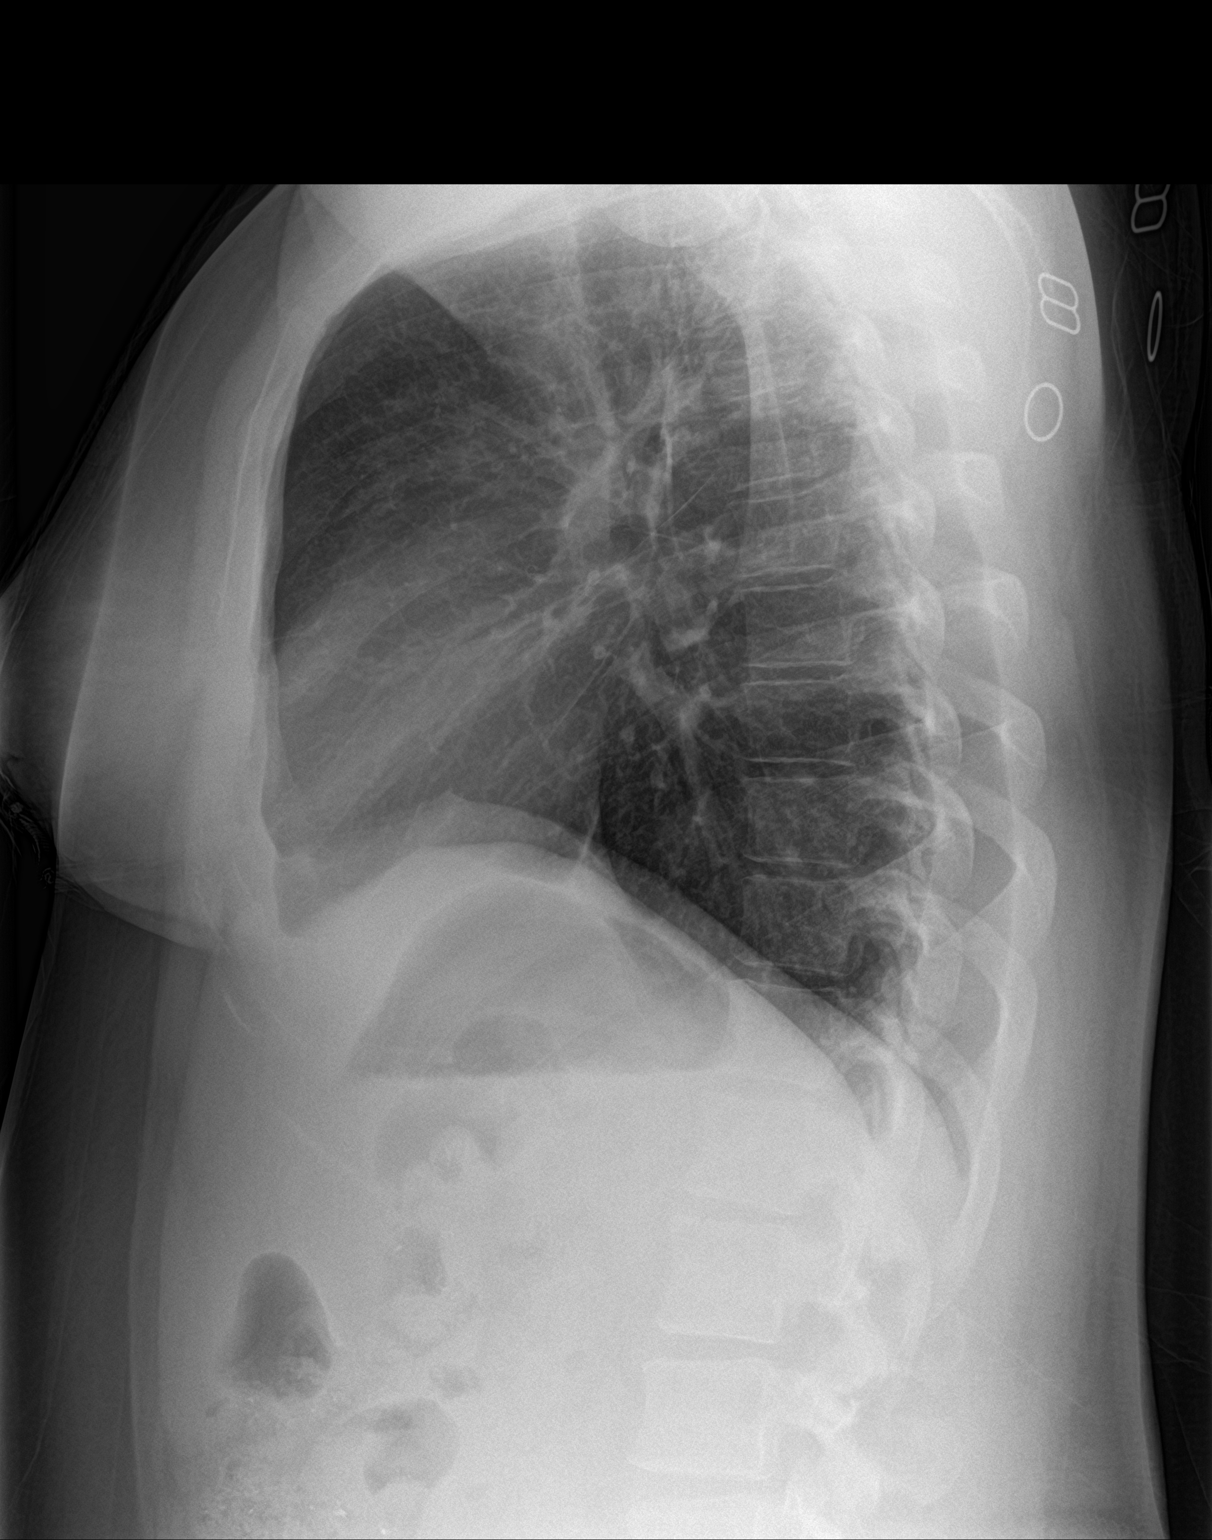

[2 of 2 positions shown; findings below may reference images not displayed]

FINDINGS: The heart size and mediastinal contours are within normal limits.
Both lungs are clear. The visualized skeletal structures are
unremarkable.
IMPRESSION: No active cardiopulmonary disease.

## 2022-02-12 IMAGING — CT CT HEAD W/O CM
4 series · 17 of 47 positions shown, 19 images · non-contrast
Comparison: April 10, 2020.

CLINICAL DATA: Head injury.



[Series 2: head bone · axial · 0.43mm/px · z∈[+300,+350]mm · 4 of 73 slices shown]
[im 8/73  bone]
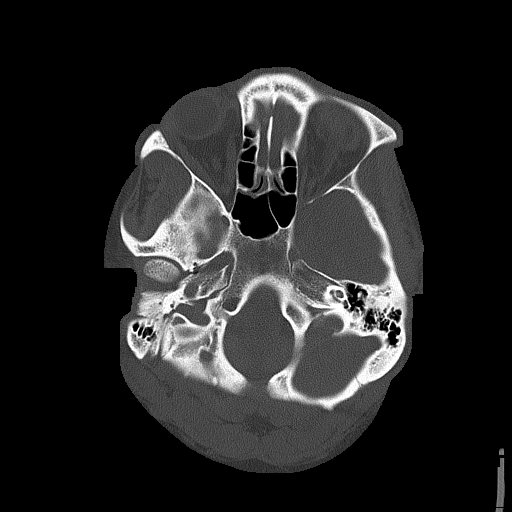
[im 15/73  bone]
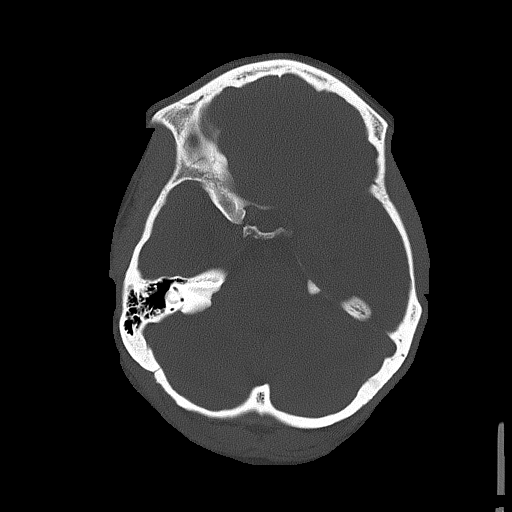
[im 22/73  bone]
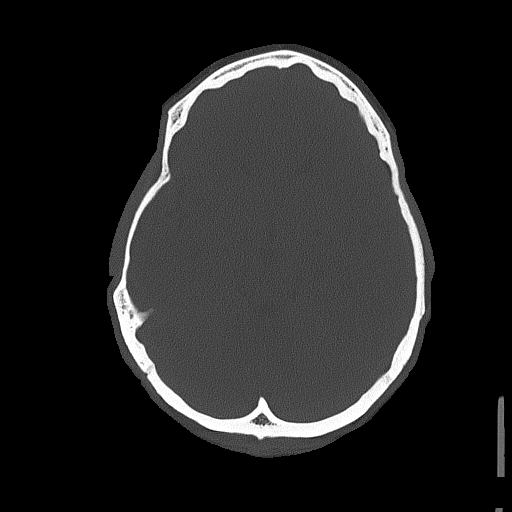
[im 33/73  bone]
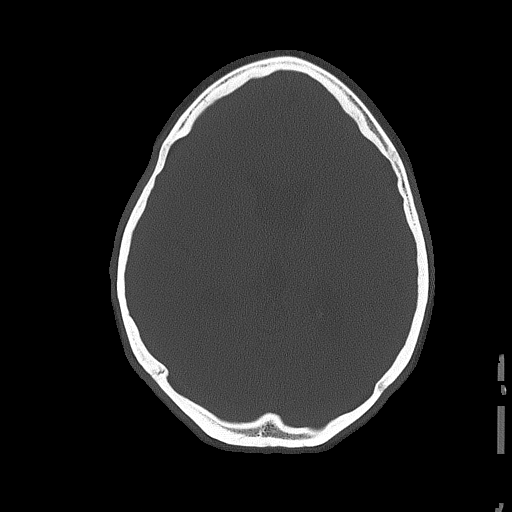

[Series 3: head wo · axial · 0.43mm/px · z∈[+301,+411]mm · 7 of 30 slices shown, 9 images]
[im 4/30  brain]
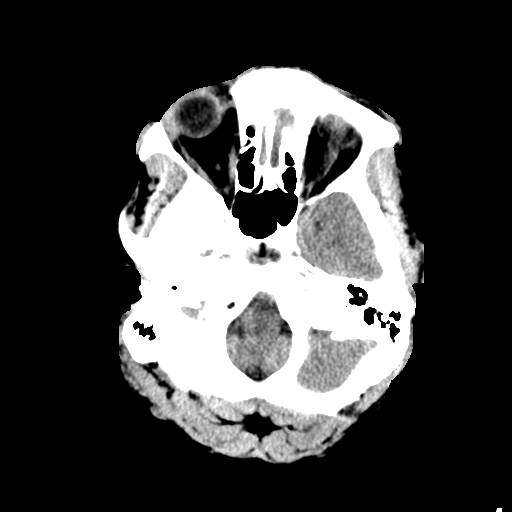
[im 4/30  bone]
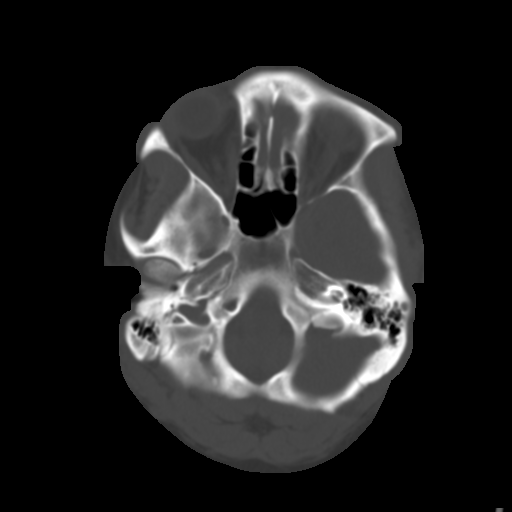
[im 8/30  brain]
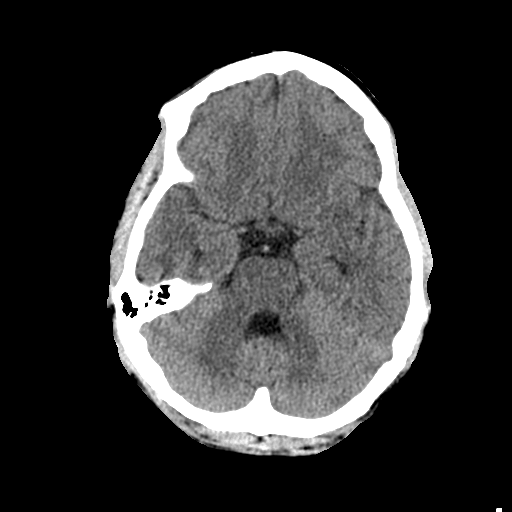
[im 11/30  brain]
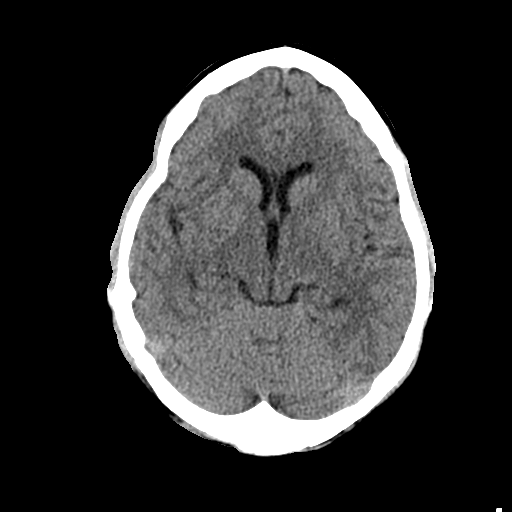
[im 15/30  brain]
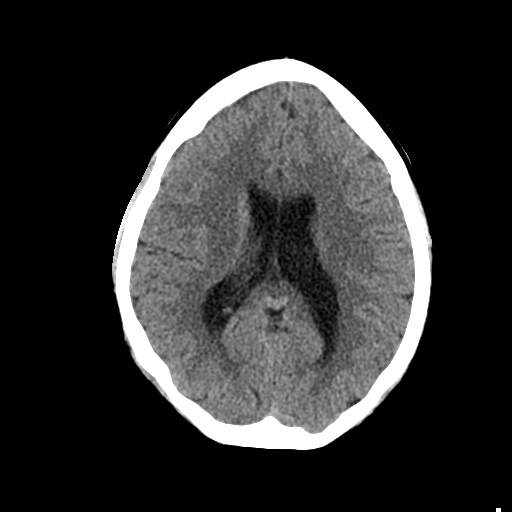
[im 19/30  brain]
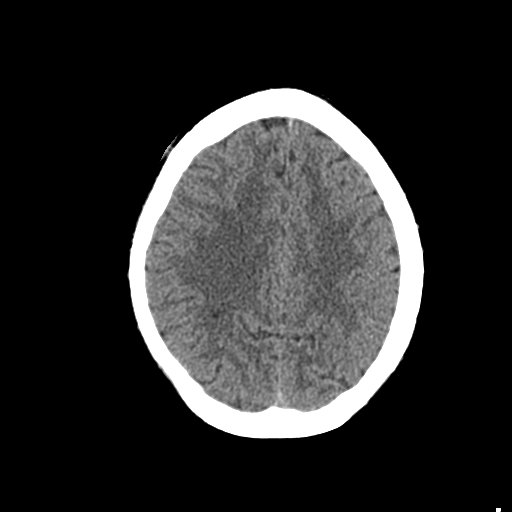
[im 19/30  bone]
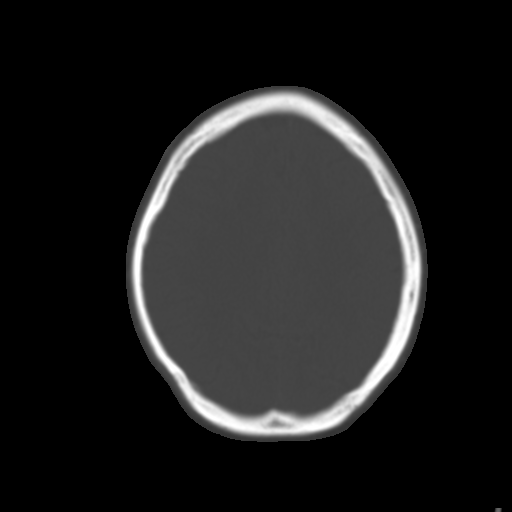
[im 22/30  brain]
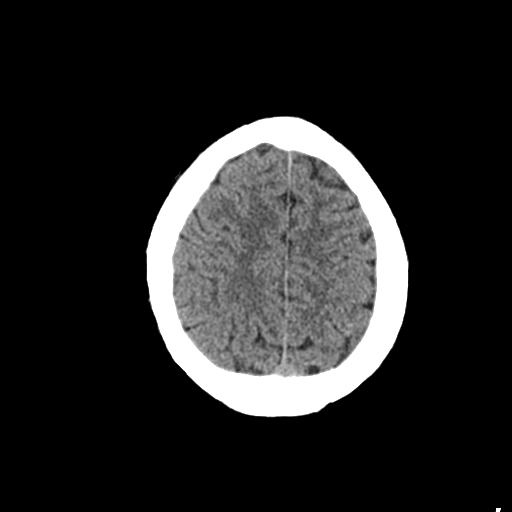
[im 26/30  brain]
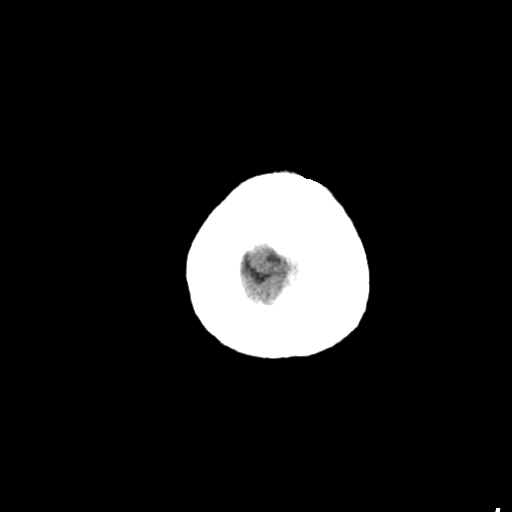

[Series 4: coronal soft tissue · coronal · 0.31mm/px · 3 of 64 slices shown]
[im 22/64  brain]
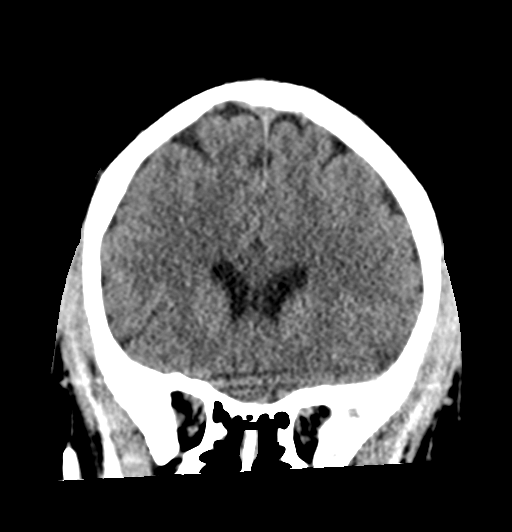
[im 29/64  brain]
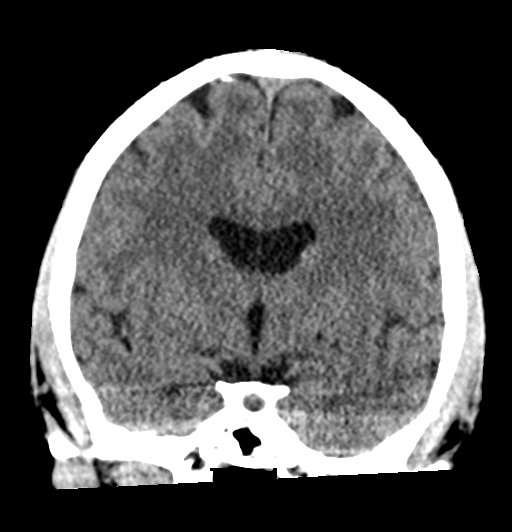
[im 36/64  brain]
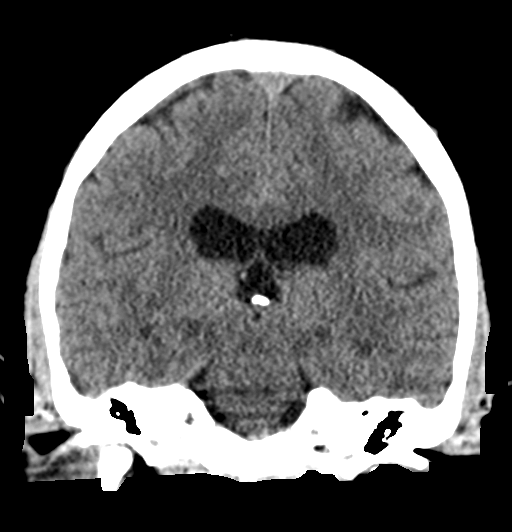

[Series 5: sagittal soft tissue · sagittal · 0.32mm/px · 3 of 53 slices shown]
[im 18/53  brain]
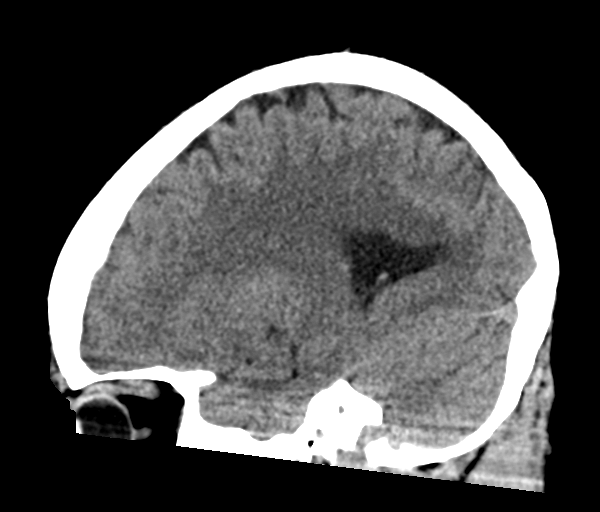
[im 27/53  brain]
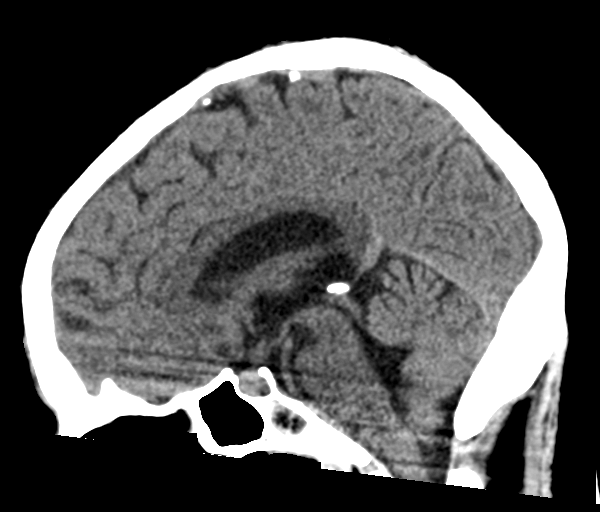
[im 35/53  brain]
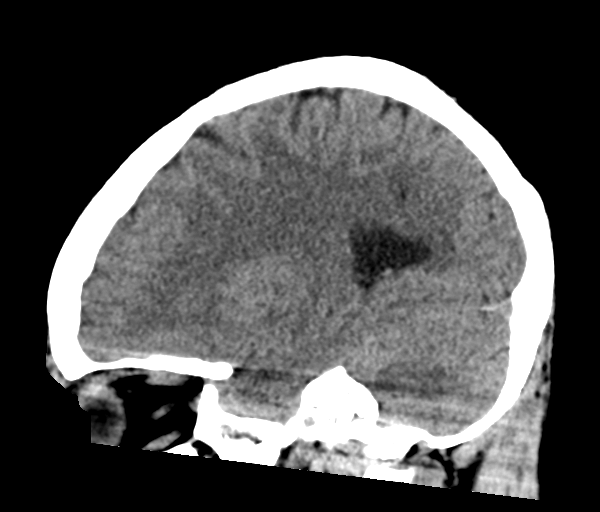

[17 of 47 positions shown; findings below may reference images not displayed]

FINDINGS: Brain: No evidence of acute infarction, hemorrhage, hydrocephalus,
extra-axial collection or mass lesion/mass effect.

Vascular: No hyperdense vessel or unexpected calcification.

Skull: Normal. Negative for fracture or focal lesion.

Sinuses/Orbits: No acute finding.

Other: None.
IMPRESSION: No acute intracranial abnormality seen.

## 2022-02-12 IMAGING — CT CT ABD-PELV W/ CM
2 of 4 series · 15 of 46 positions shown, 17 images · IV contrast (APPLIED)
Comparison: Abdominopelvic CT 08/21/2020

CLINICAL DATA: Abdominal pain, acute, nonlocalized. Syncopal
episode with nausea and vomiting. History of diabetes.

EXAM:
CT ABDOMEN AND PELVIS WITH CONTRAST
TECHNIQUE: Multidetector CT imaging of the abdomen and pelvis was performed
using the standard protocol following bolus administration of
intravenous contrast.

[Series 2: routine abd/pel with · axial · 0.86mm/px · z∈[-381,+24]mm · 12 of 89 slices shown, 14 images]
[im 4/89  soft-tissue]
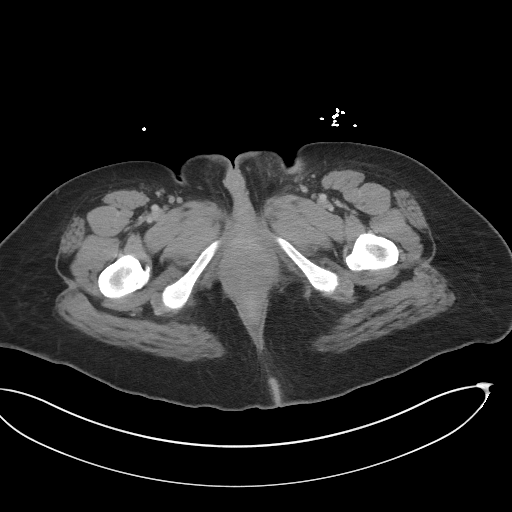
[im 4/89  bone]
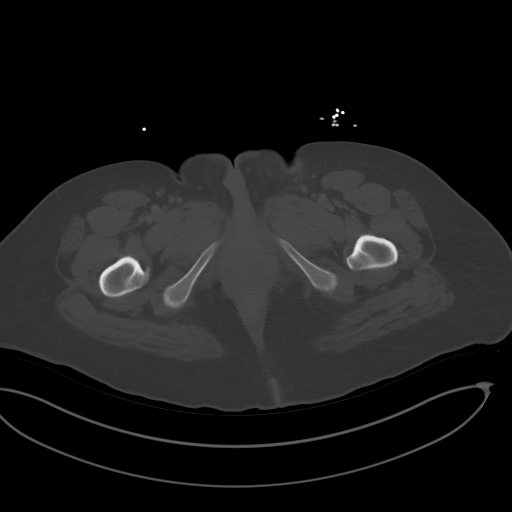
[im 12/89  soft-tissue]
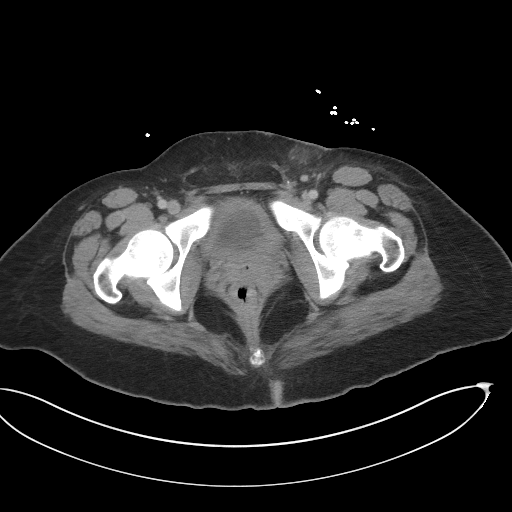
[im 19/89  soft-tissue]
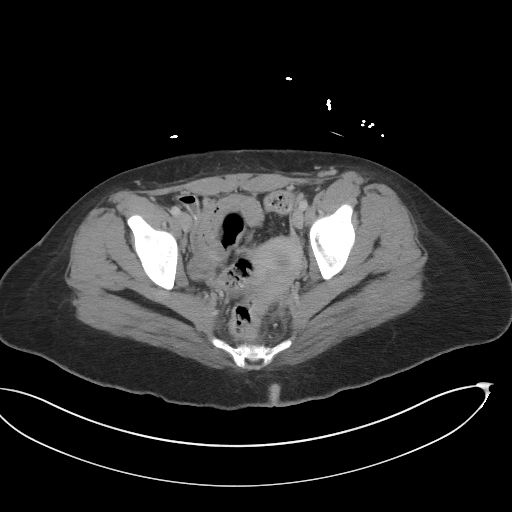
[im 26/89  soft-tissue]
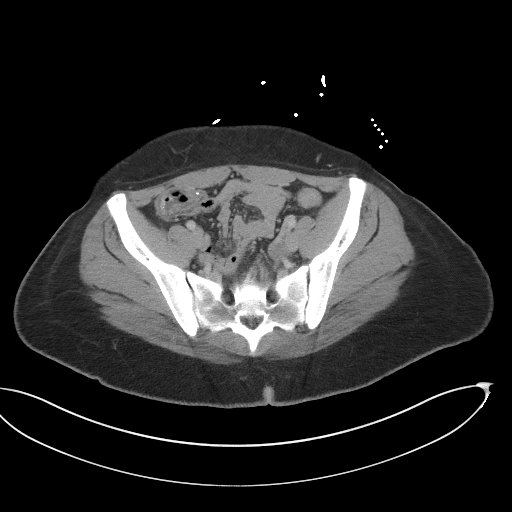
[im 34/89  soft-tissue]
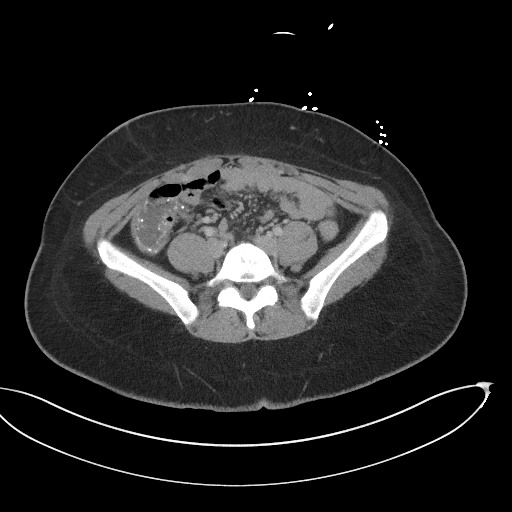
[im 41/89  soft-tissue]
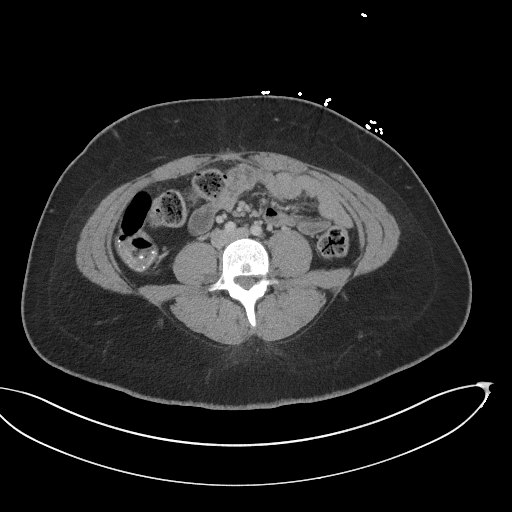
[im 48/89  soft-tissue]
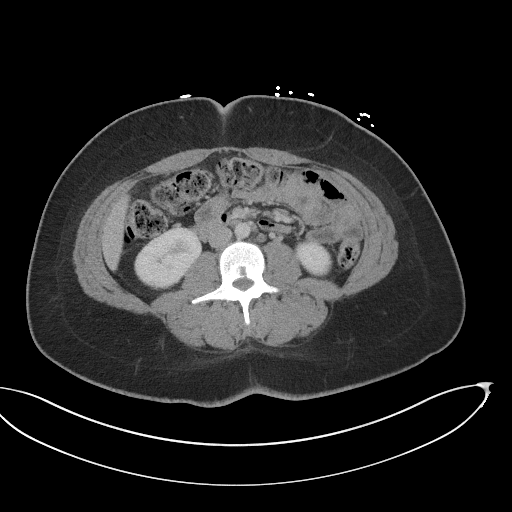
[im 56/89  soft-tissue]
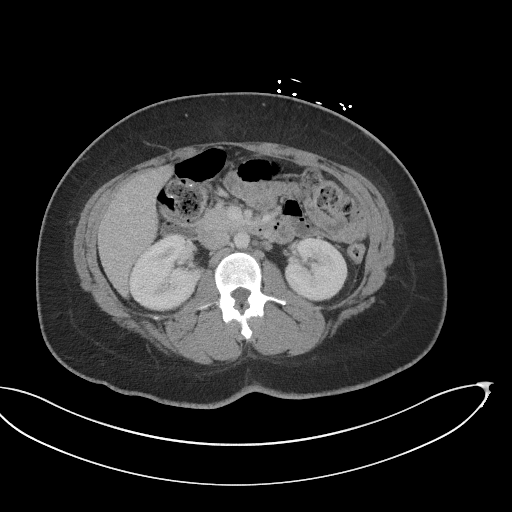
[im 63/89  soft-tissue]
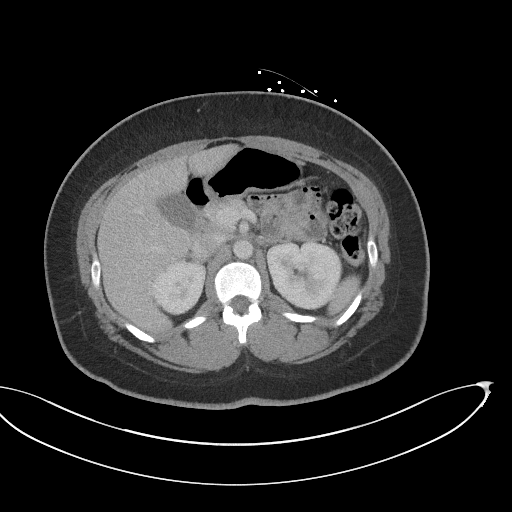
[im 63/89  bone]
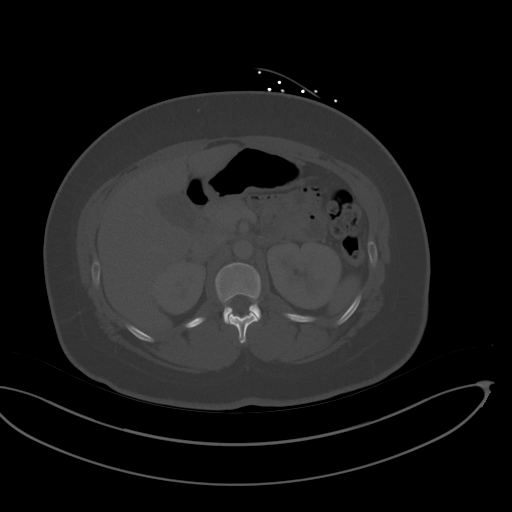
[im 70/89  soft-tissue]
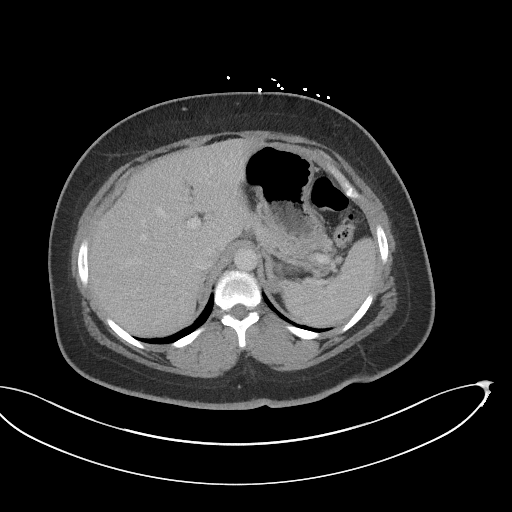
[im 78/89  soft-tissue]
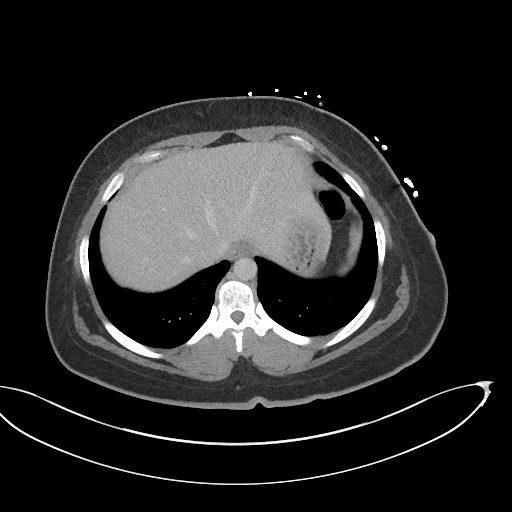
[im 85/89  soft-tissue]
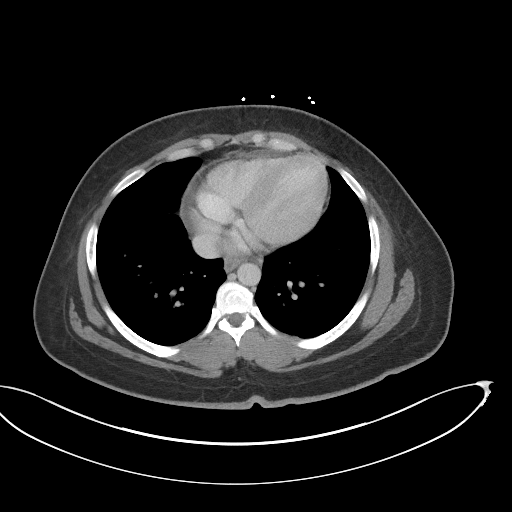

[Series 5: coronal st · coronal · 0.74mm/px · 3 of 94 slices shown]
[im 32/94  soft-tissue]
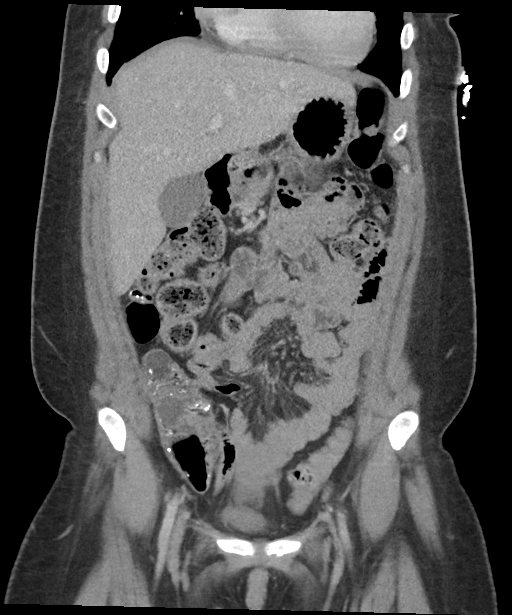
[im 42/94  soft-tissue]
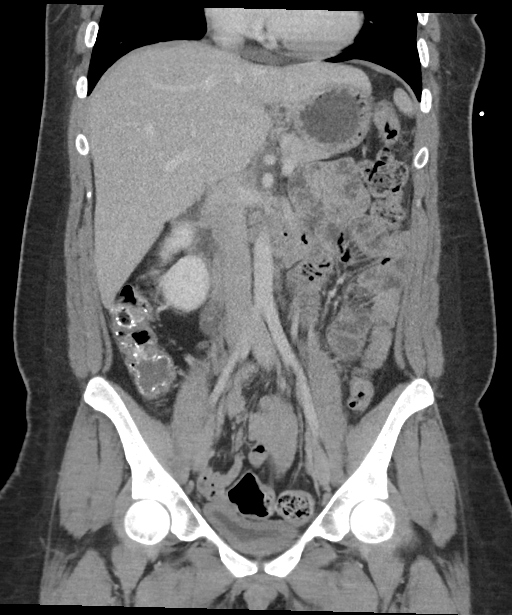
[im 52/94  soft-tissue]
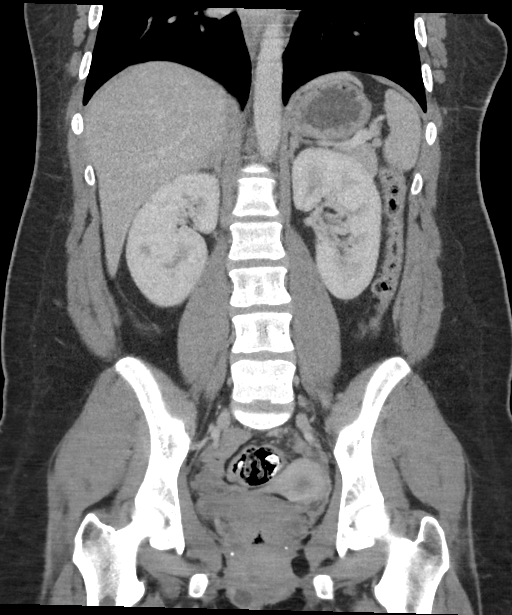

[15 of 46 positions shown; findings below may reference images not displayed]

RADIATION DOSE REDUCTION: This exam was performed according to the
departmental dose-optimization program which includes automated
exposure control, adjustment of the mA and/or kV according to
patient size and/or use of iterative reconstruction technique.

CONTRAST:  100mL OMNIPAQUE IOHEXOL 300 MG/ML  SOLN
FINDINGS: Lower chest: Clear lung bases. No significant pleural or pericardial
effusion. There is a small hiatal hernia.

Hepatobiliary: The liver is normal in density without suspicious
focal abnormality. No evidence of gallstones, gallbladder wall
thickening or biliary dilatation.

Pancreas: Unremarkable. No pancreatic ductal dilatation or
surrounding inflammatory changes.

Spleen: Normal in size without focal abnormality.

Adrenals/Urinary Tract: Stable 14 mm left adrenal nodule on image
[DATE], consistent with an incidental adenoma based on stability. The
right adrenal gland appears normal. Both kidneys appear normal,
without evidence of urinary tract calculus, hydronephrosis or focal
abnormality. The bladder is incompletely distended and suboptimally
evaluated. There is possible bladder wall thickening.

Stomach/Bowel: No enteric contrast was administered for this
examination. There is high density particulate matter within the
lumen of the right colon. The stomach appears unremarkable for its
degree of distension. No evidence of bowel wall thickening,
distention or surrounding inflammatory change. The appendix appears
normal.

Vascular/Lymphatic: There are no enlarged lymph nodes within the
abdomen or pelvis. There is a heterogeneous mass in the left
inguinal region with central low-density, measuring 2.9 x 2.6 cm
transverse on image 82/2 and extending 3.9 cm in length on sagittal
image 82/6. This extends to the skin surface and could reflect a
necrotic lymph node or abscess. No air or foreign body is seen
associated with this lesion. There are other mildly prominent left
inguinal lymph nodes, measuring up to 1.3 cm short axis on coronal
image [DATE]. No significant vascular findings are evident.

Reproductive: The uterus and ovaries appear unremarkable. No adnexal
mass. Right-sided Bartholin's cyst measuring approximately 2.2 cm on
image 88/2 is unchanged.

Other: No ascites, free air or focal intra-fluid collection.

Musculoskeletal: No acute or significant osseous findings.
IMPRESSION: 1. Heterogeneous mass superficially in the left groin, suspicious
for necrotic adenopathy or an abscess. Correlate clinically and
consider tissue sampling as warranted.
2. No intra-abdominal adenopathy or acute inflammatory changes
identified.
3. Mild bladder wall thickening, likely due to incomplete
distension.
4. Stable small left adrenal adenoma.

## 2022-02-19 IMAGING — CT CT HEAD W/O CM
4 of 8 series · 12 of 47 positions shown, 14 images · non-contrast
Comparison: CT February 12, 2021.  MRI November 14, 2017.

CLINICAL DATA: Neuro deficit, acute, stroke suspected



[Series 2: head wo · axial · 0.40mm/px · z∈[+330,+430]mm · 5 of 30 slices shown, 7 images]
[im 5/30  brain]
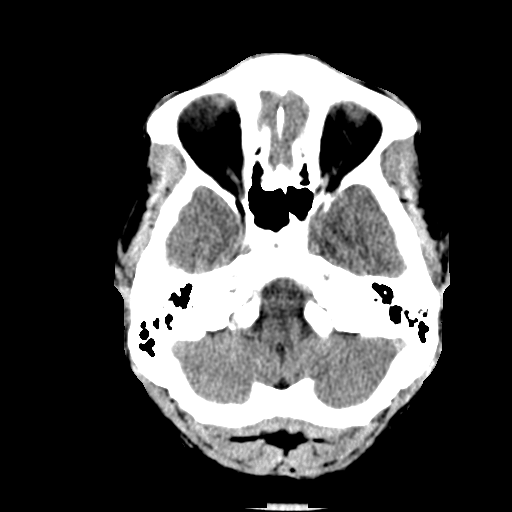
[im 5/30  bone]
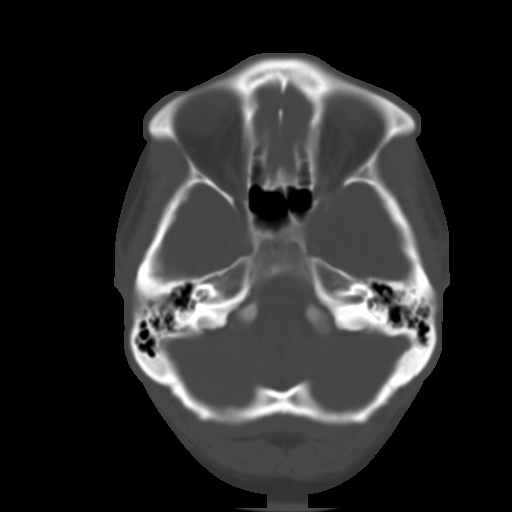
[im 10/30  brain]
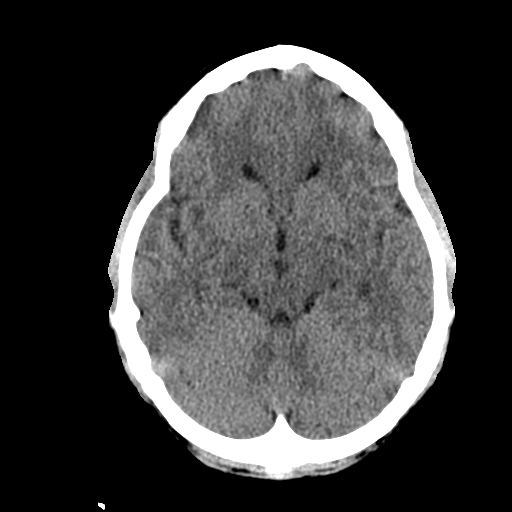
[im 15/30  brain]
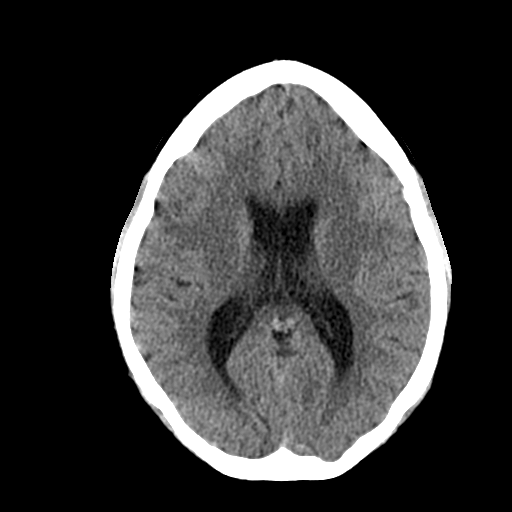
[im 20/30  brain]
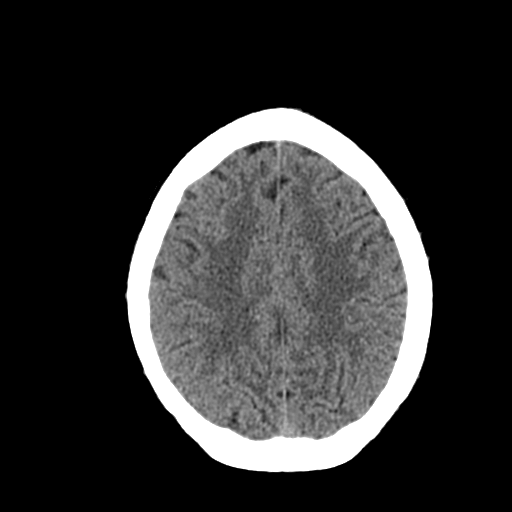
[im 25/30  brain]
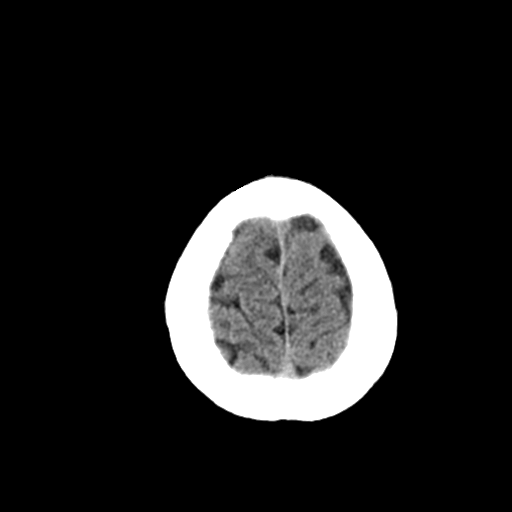
[im 25/30  bone]
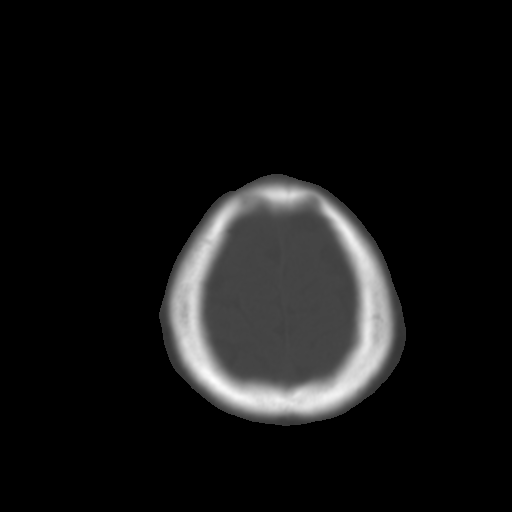

[Series 3: head bone · axial · 0.40mm/px · z∈[+326,+344]mm · 2 of 75 slices shown]
[im 9/75  bone]
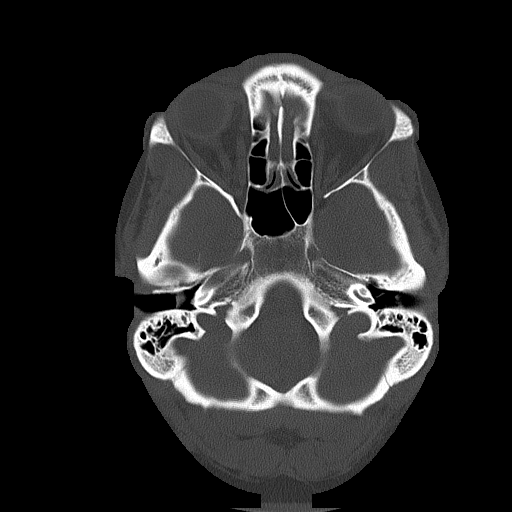
[im 18/75  bone]
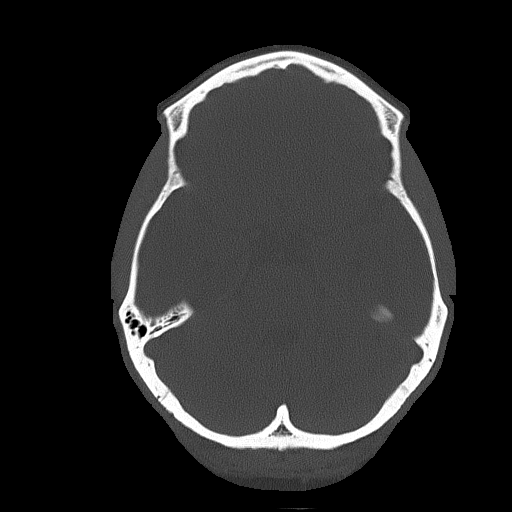

[Series 4: coronal soft tissue · coronal · 0.29mm/px · 3 of 63 slices shown]
[im 16/63  brain]
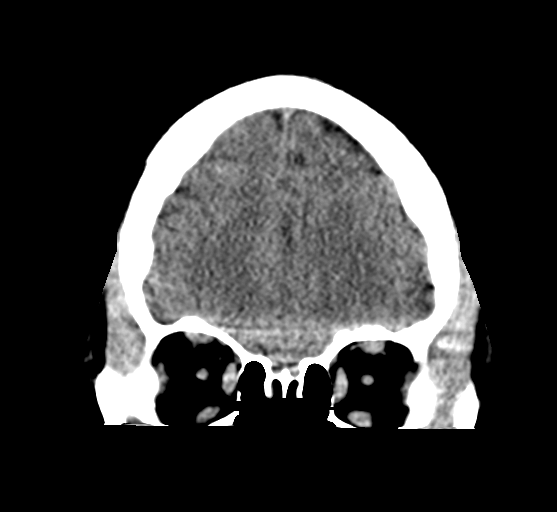
[im 32/63  brain]
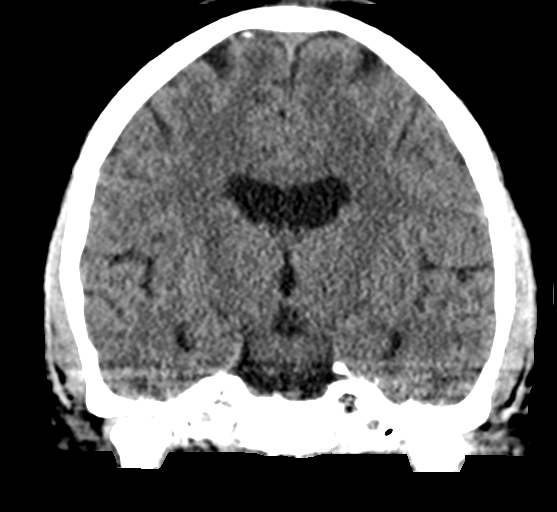
[im 47/63  brain]
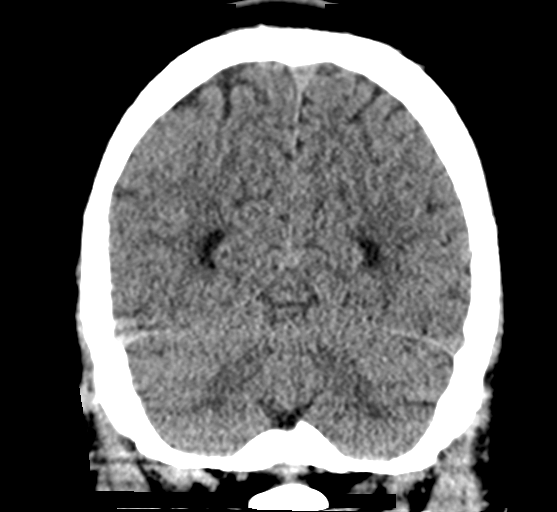

[Series 9: sagittal soft tissue · sagittal · 0.10mm/px · 2 of 52 slices shown]
[im 18/52  brain]
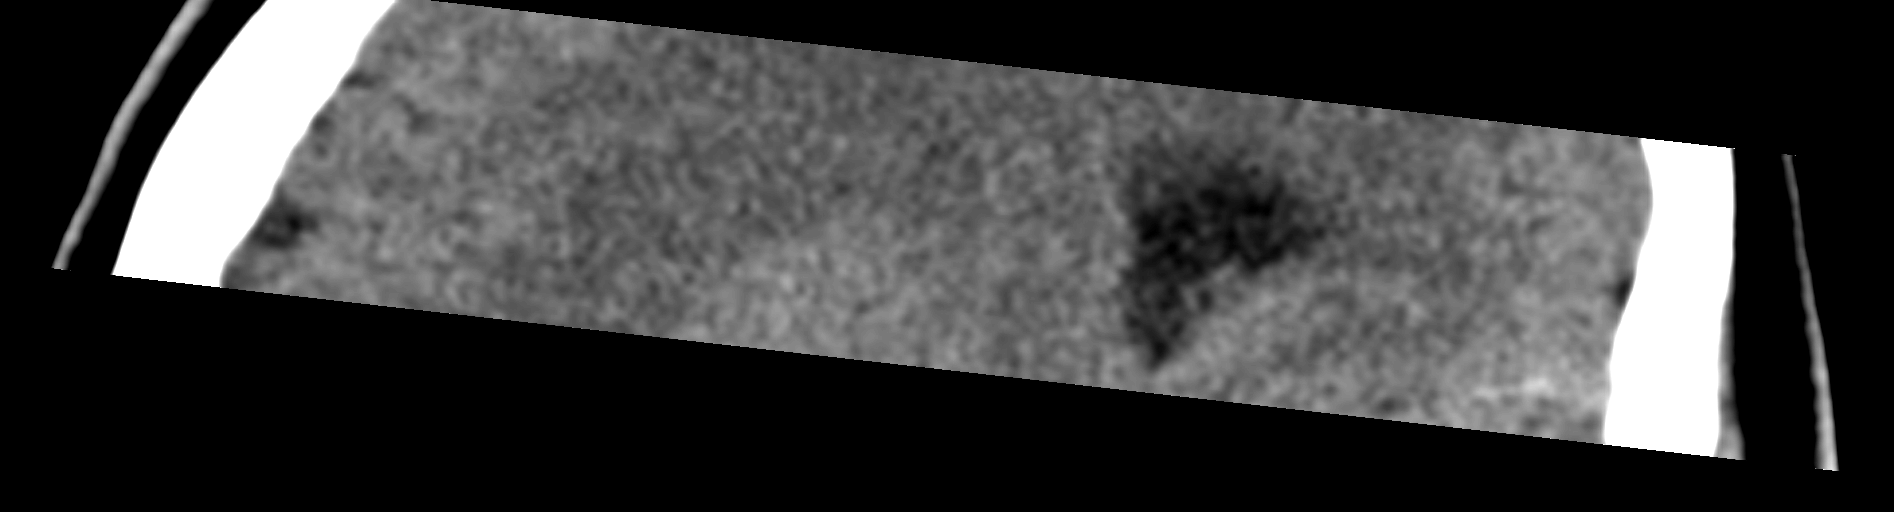
[im 35/52  brain]
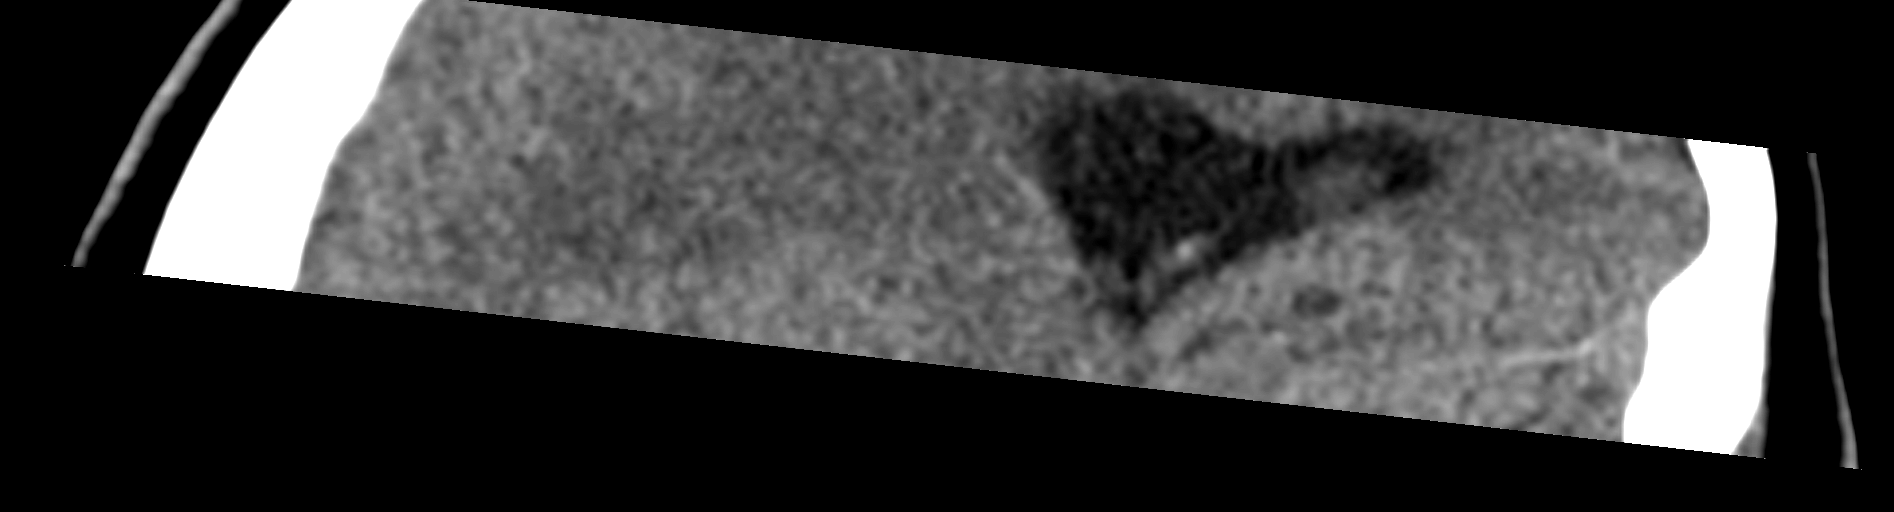

[12 of 47 positions shown; findings below may reference images not displayed]

FINDINGS: Brain: No evidence of acute large vascular territory infarction,
hemorrhage, hydrocephalus, extra-axial collection or mass
lesion/mass effect. The atria of the lateral ventricles are mildly
prominent in size with mildly angular morphology, which is a chronic
finding and possibly the sequela of white matter loss that was
better characterized on MRI from 6850.

Vascular: No hyperdense vessel identified.

Skull: No acute fracture.

Sinuses/Orbits: Visualized sinuses are largely clear. Unremarkable
orbits.

Other: No mastoid effusions.
IMPRESSION: 1. No evidence of acute intracranial abnormality.
2. Possible mild remote white matter injury/volume loss, better
characterized on MRI from November 24, 2017.

## 2022-02-19 IMAGING — MR MR HEAD W/O CM
11 series · 48 of 48 positions shown · non-contrast
Comparison: 12/01/2018

CLINICAL DATA: Acute neurologic deficit

EXAM:
MRI HEAD WITHOUT CONTRAST
TECHNIQUE: Multiplanar, multiecho pulse sequences of the brain and surrounding
structures were obtained without intravenous contrast.

[Series 5: ax dwi_tracew · axial · 3.0mm · 0.65mm/px · z∈[-111,+33]mm · 5 of 48 slices shown]
[im 1/48]
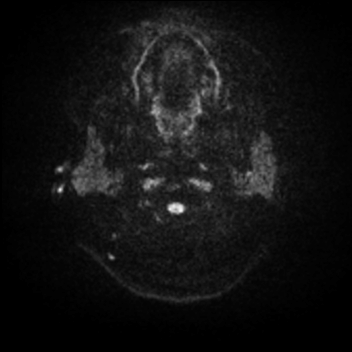
[im 12/48]
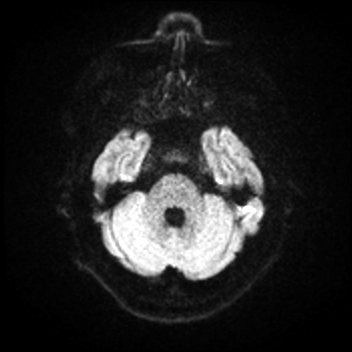
[im 24/48]
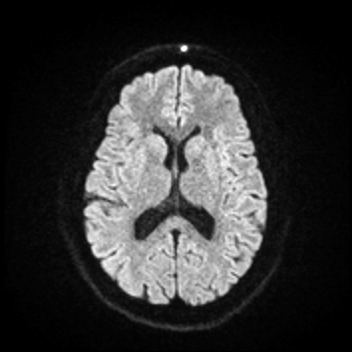
[im 36/48]
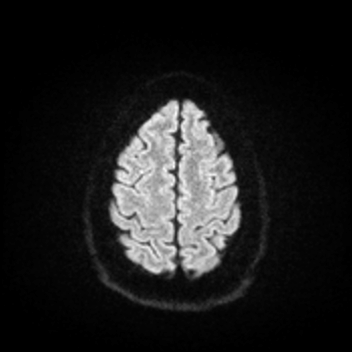
[im 48/48]
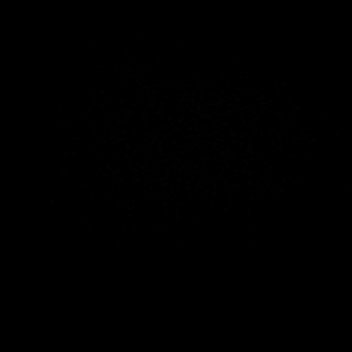

[Series 6: ax dwi_adc · axial · 3.0mm · 0.65mm/px · z∈[-111,+23]mm · 4 of 45 slices shown]
[im 1/45]
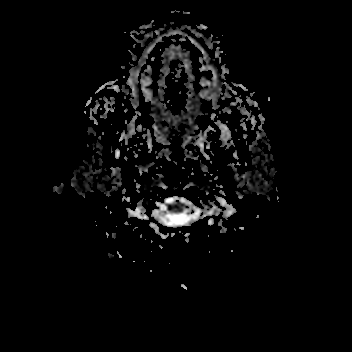
[im 15/45]
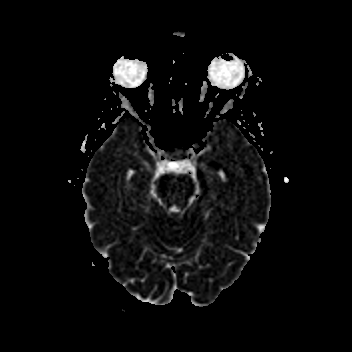
[im 30/45]
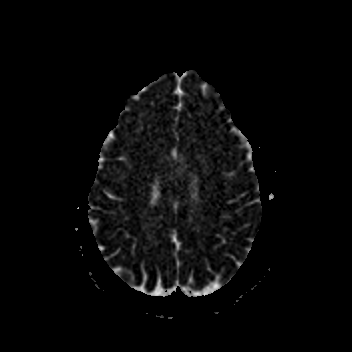
[im 45/45]
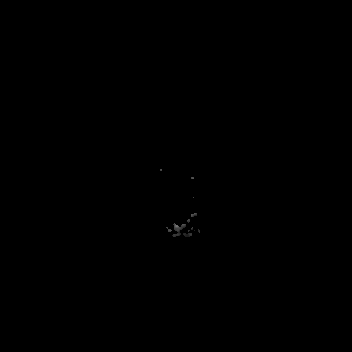

[Series 7: cor dwi_tracew · coronal · 5.0mm · 0.65mm/px · 3 of 40 slices shown]
[im 1/40]
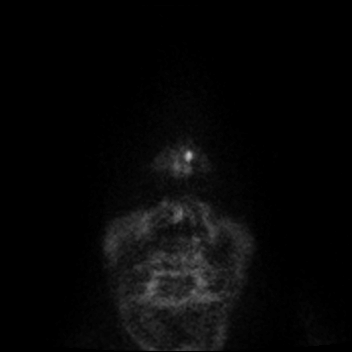
[im 20/40]
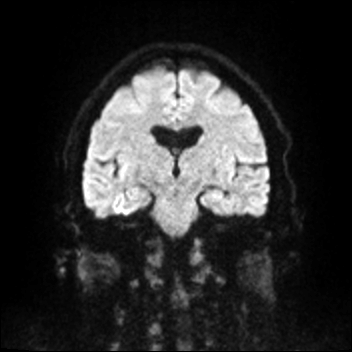
[im 40/40]
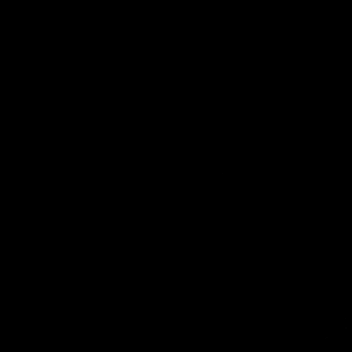

[Series 8: cor dwi_adc · coronal · 5.0mm · 0.65mm/px · 3 of 37 slices shown]
[im 1/37]
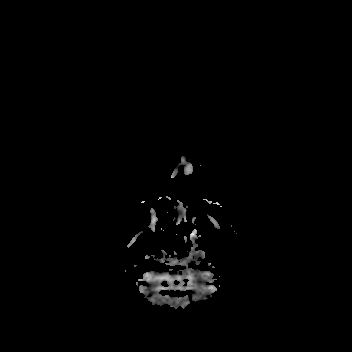
[im 19/37]
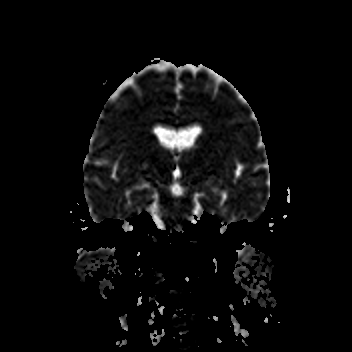
[im 37/37]
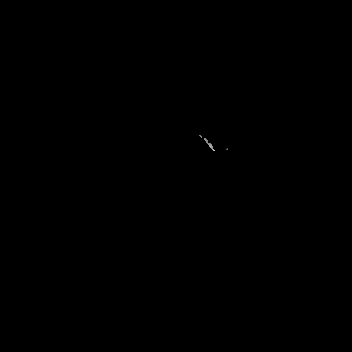

[Series 9: T1 · sagittal · 5.0mm · 0.62mm/px · 2 of 25 slices shown (1 of 2)]
[im 1/25]
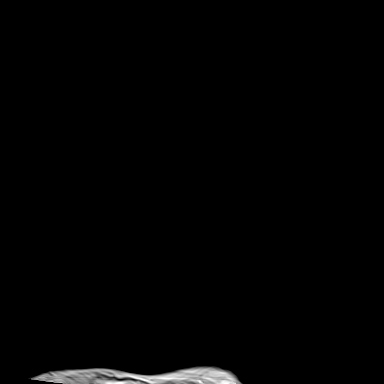
[im 25/25]
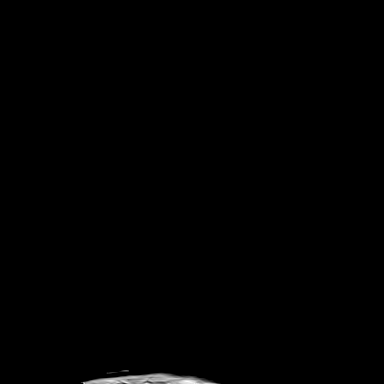

[Series 10: T2 · axial · 5.0mm · 0.53mm/px · z∈[-107,+32]mm · 2 of 25 slices shown (1 of 2)]
[im 1/25]
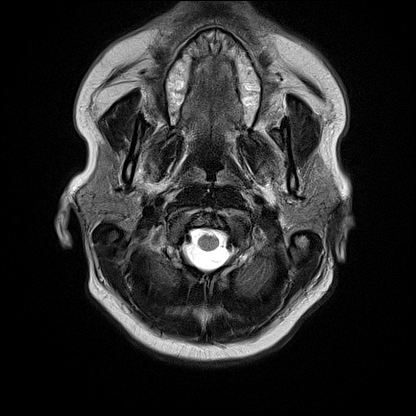
[im 25/25]
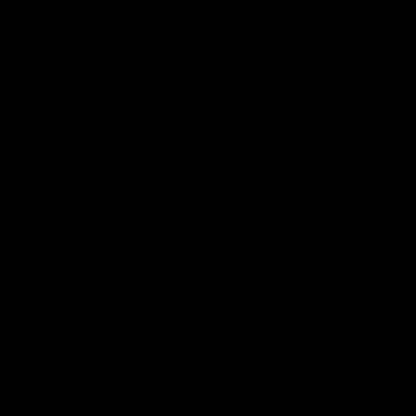

[Series 12: pha_images · axial · 3.0mm · 0.90mm/px · z∈[-122,+37]mm · 4 of 54 slices shown]
[im 1/54]
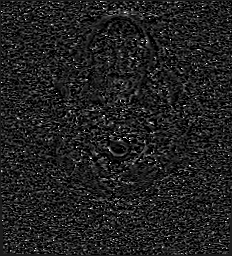
[im 18/54]
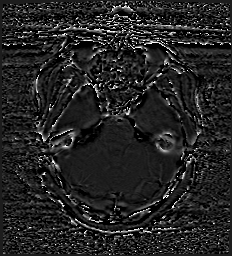
[im 36/54]
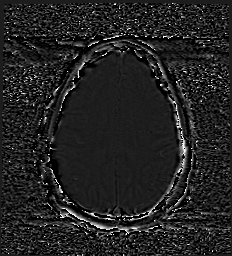
[im 54/54]
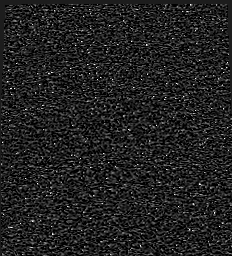

[Series 13: swi_images · axial · 3.0mm · 0.90mm/px · z∈[-122,+43]mm · 5 of 60 slices shown]
[im 1/60]
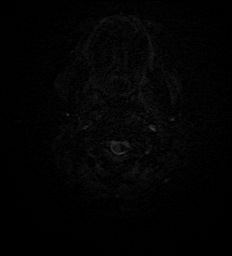
[im 15/60]
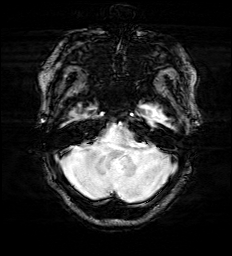
[im 30/60]
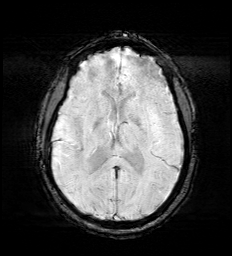
[im 45/60]
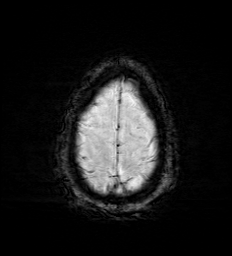
[im 60/60]
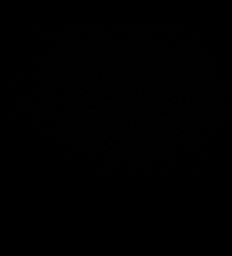

[Series 15: FLAIR · axial · 3.0mm · 0.53mm/px · z∈[-113,+38]mm · 4 of 55 slices shown]
[im 1/55]
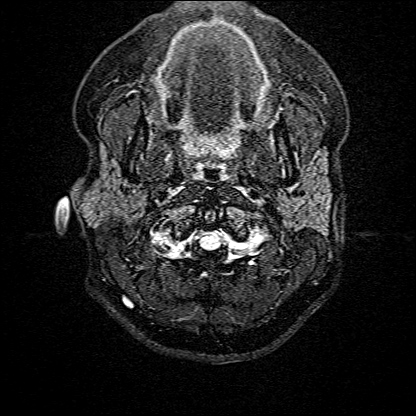
[im 19/55]
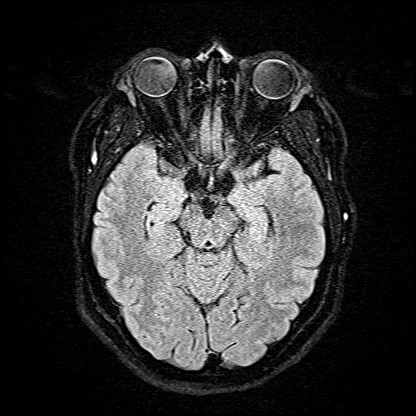
[im 37/55]
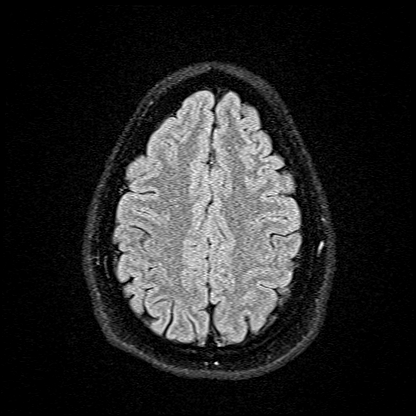
[im 55/55]
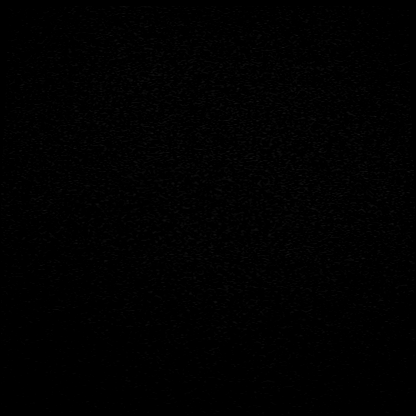

[Series 16: T1 · axial · 1.0mm · 0.98mm/px · z∈[-124,+38]mm · 14 of 174 slices shown (2 of 2)]
[im 1/174]
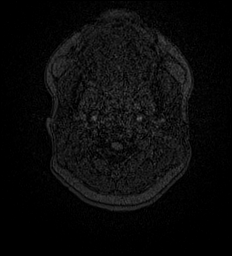
[im 14/174]
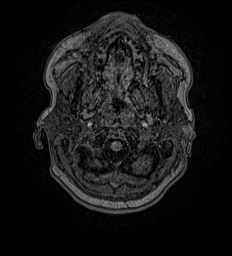
[im 27/174]
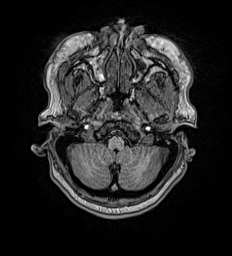
[im 40/174]
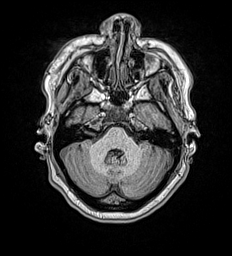
[im 54/174]
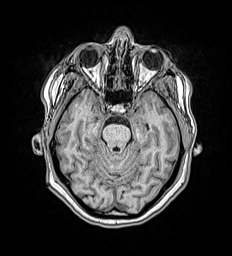
[im 67/174]
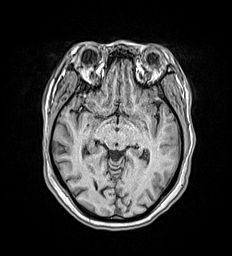
[im 80/174]
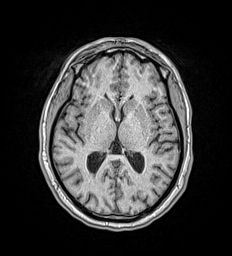
[im 94/174]
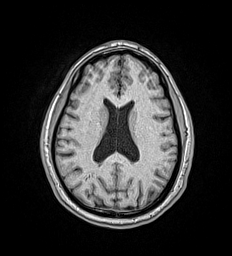
[im 107/174]
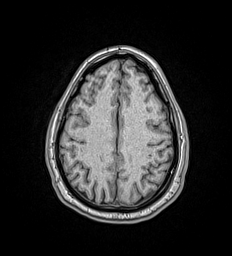
[im 120/174]
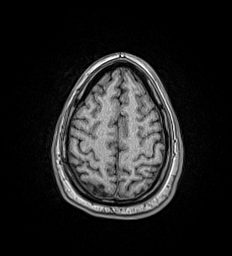
[im 134/174]
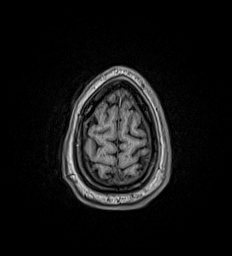
[im 147/174]
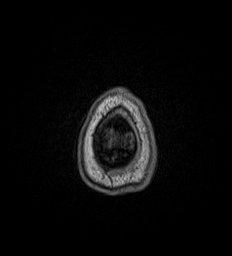
[im 160/174]
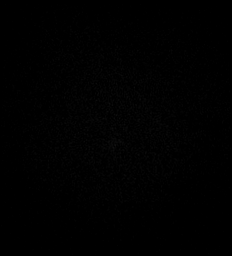
[im 174/174]
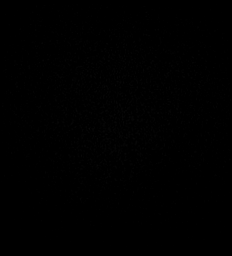

[Series 17: T2 · coronal · 5.0mm · 0.57mm/px · 2 of 29 slices shown (2 of 2)]
[im 1/29]
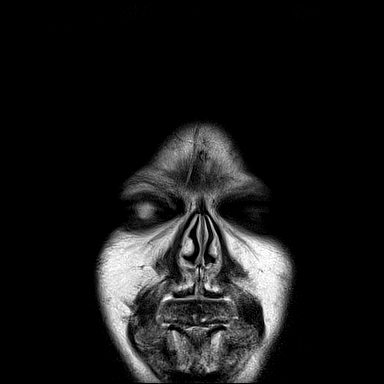
[im 29/29]
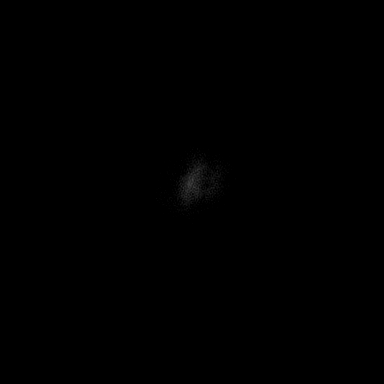

[48 of 48 positions shown; findings below may reference images not displayed]

FINDINGS: Brain: No acute infarct, mass effect or extra-axial collection. No
acute or chronic hemorrhage. Normal white matter signal, parenchymal
volume and CSF spaces. The midline structures are normal.

Vascular: Major flow voids are preserved.

Skull and upper cervical spine: Normal calvarium and skull base.
Visualized upper cervical spine and soft tissues are normal.

Sinuses/Orbits:No paranasal sinus fluid levels or advanced mucosal
thickening. No mastoid or middle ear effusion. Normal orbits.
IMPRESSION: Normal brain MRI.

## 2022-03-07 IMAGING — DX DG CHEST 1V PORT
1 series · 1 of 1 positions shown · non-contrast
Comparison: 02/08/2021

CLINICAL DATA: Emesis. Evaluate for infiltrate. Numerous seizures
over the past several days.

EXAM:
PORTABLE CHEST 1 VIEW

[chest ap]
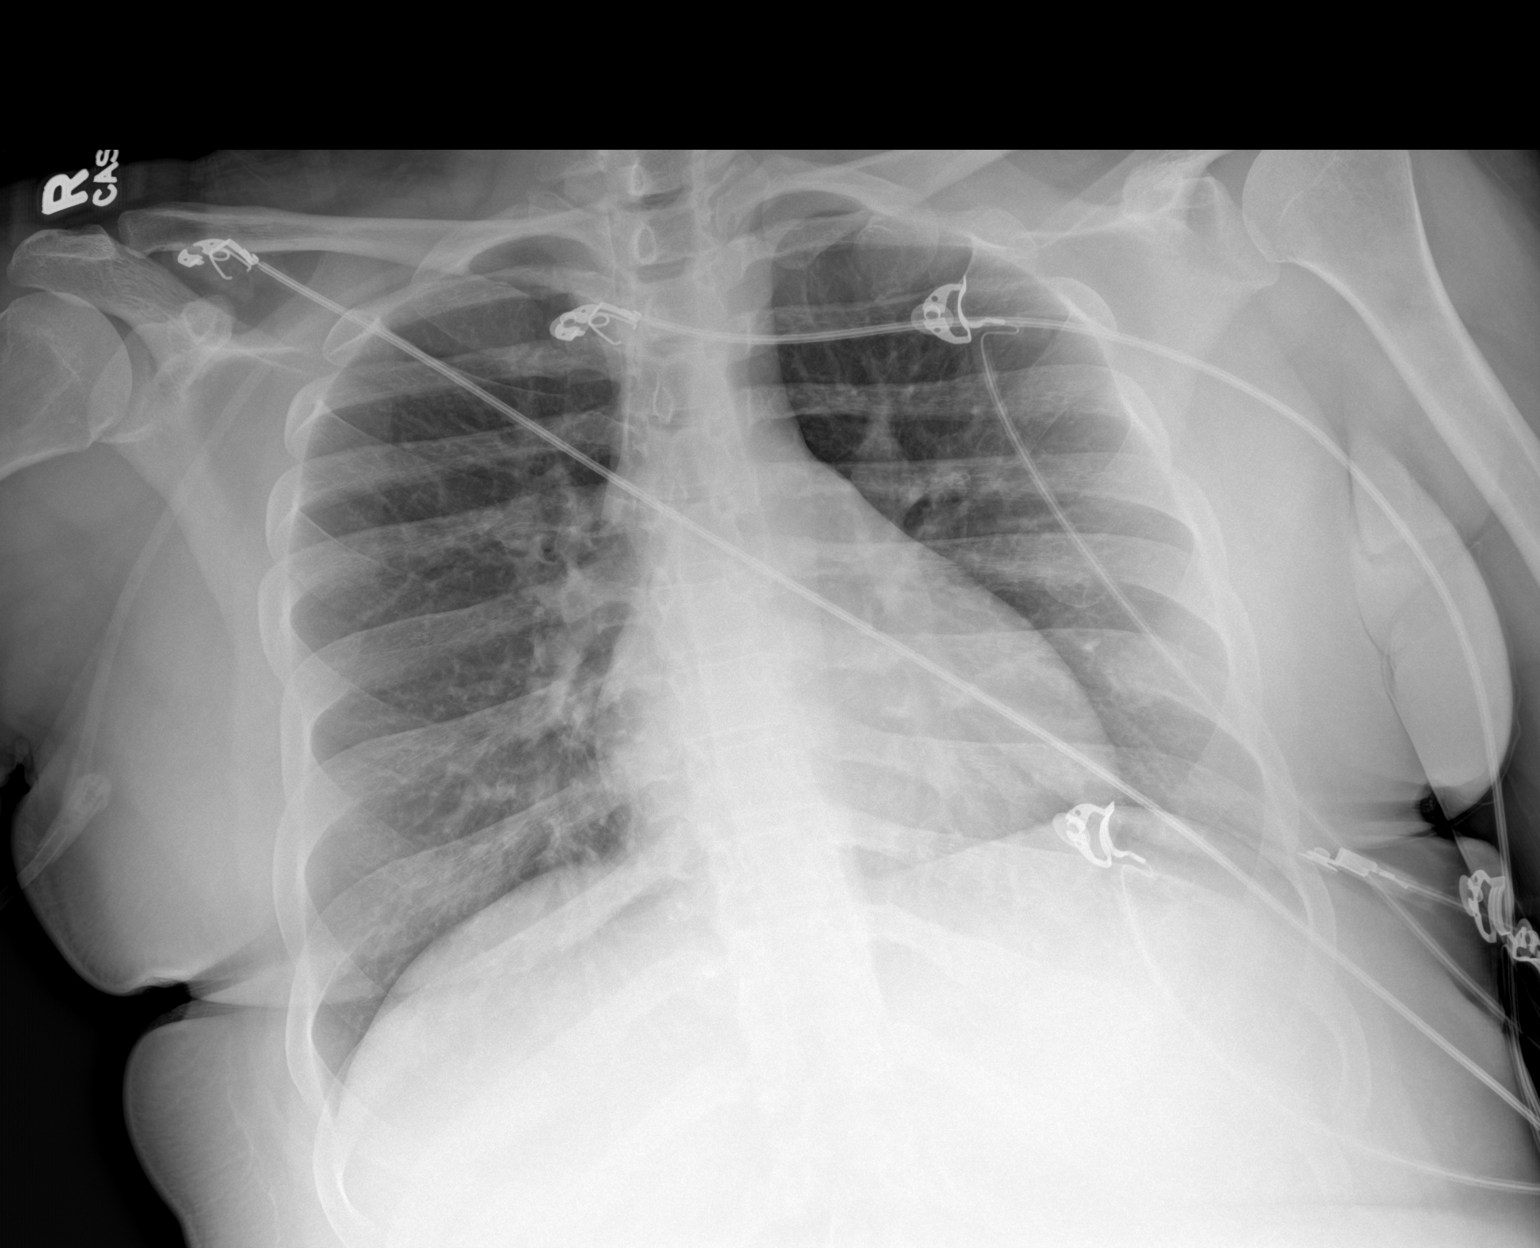

[1 of 1 positions shown; findings below may reference images not displayed]

FINDINGS: Cardiac silhouette and mediastinal contours are within normal
limits. The lungs are clear. No pleural effusion or pneumothorax. No
acute skeletal abnormality.
IMPRESSION: No active disease.

## 2022-03-07 IMAGING — CT CT HEAD W/O CM
4 series · 17 of 47 positions shown, 19 images · non-contrast
Comparison: Brain MRI 02/19/2021. Prior head CT examinations
02/19/2021 and earlier.

CLINICAL DATA: History: Seizure, new onset, no history of trauma.
Additional history provided: EMS reports patient has had a total of
22 seizures over the past several days. Patient found on floor
unresponsive this morning. Three witnessed seizures today lasting
approximately 30 seconds. History of epilepsy.



[Series 2: head wo · axial · 0.45mm/px · z∈[-136,-16]mm · 7 of 34 slices shown, 9 images]
[im 5/34  brain]
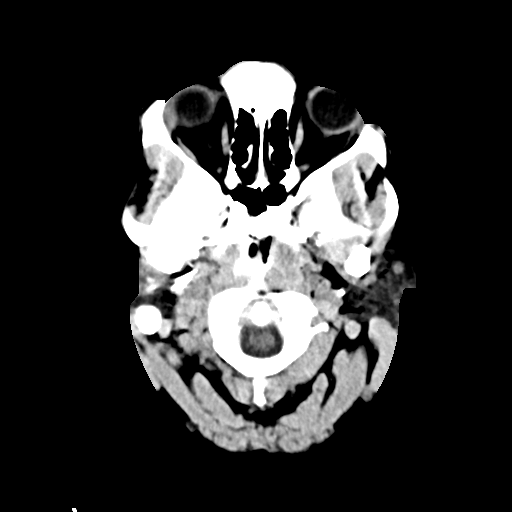
[im 5/34  bone]
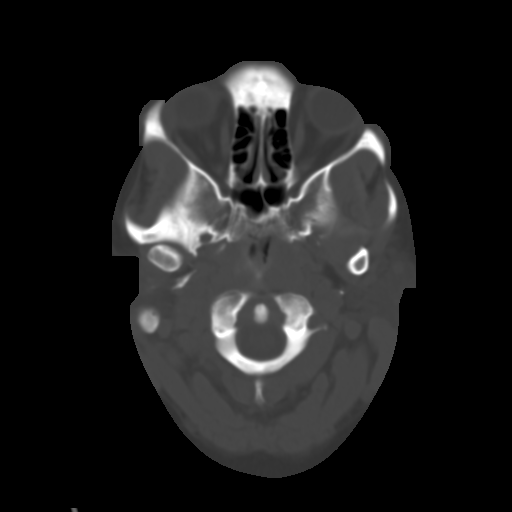
[im 9/34  brain]
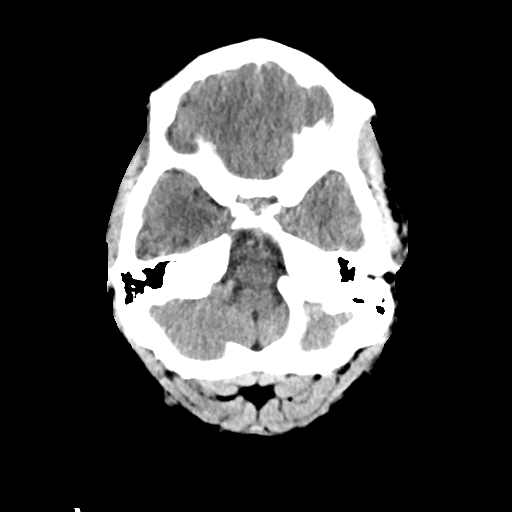
[im 13/34  brain]
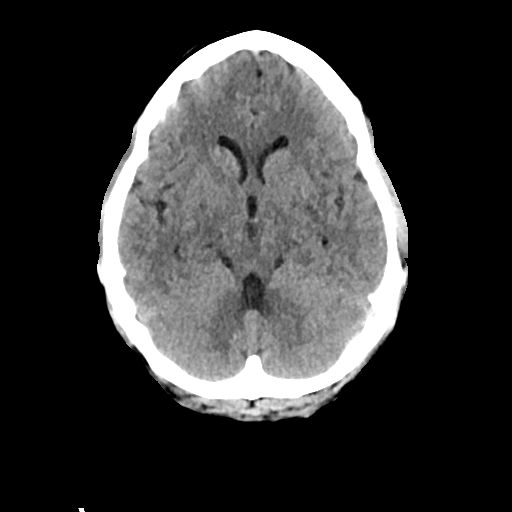
[im 17/34  brain]
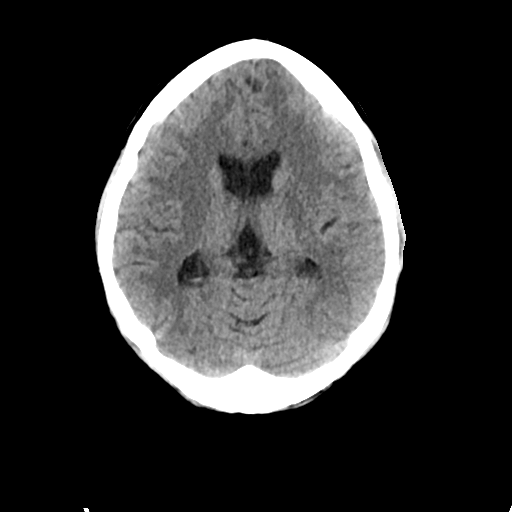
[im 21/34  brain]
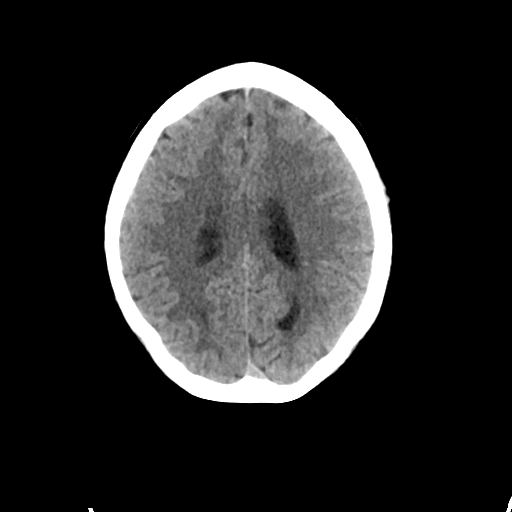
[im 21/34  bone]
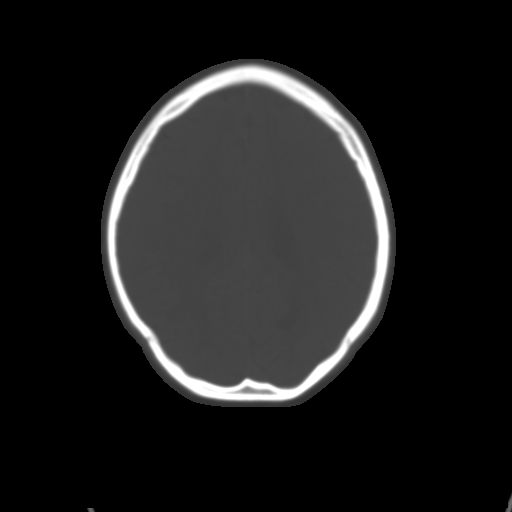
[im 25/34  brain]
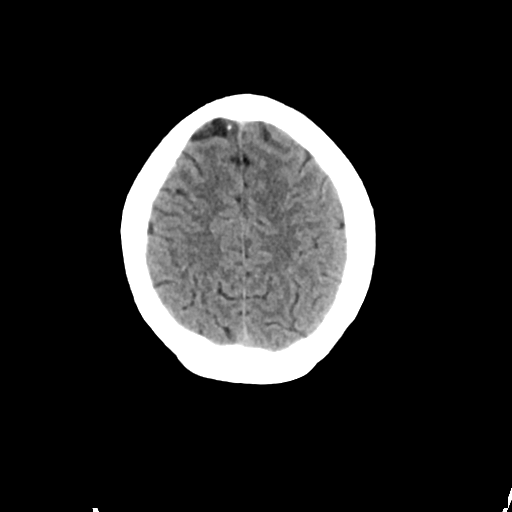
[im 29/34  brain]
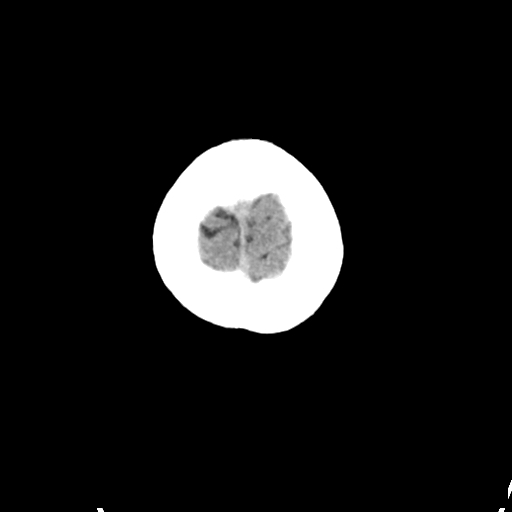

[Series 3: head bone · axial · 0.45mm/px · z∈[-140,-82]mm · 4 of 84 slices shown]
[im 9/84  bone]
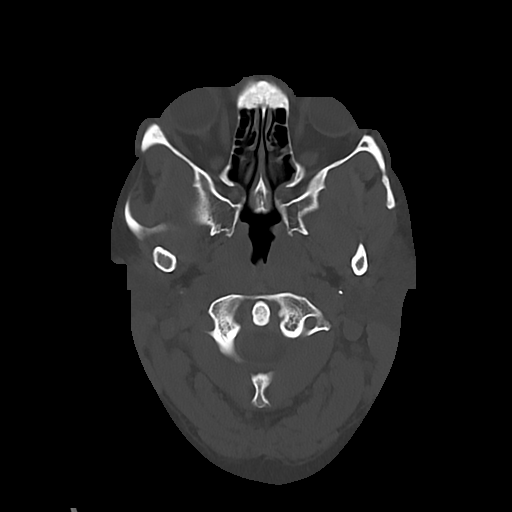
[im 17/84  bone]
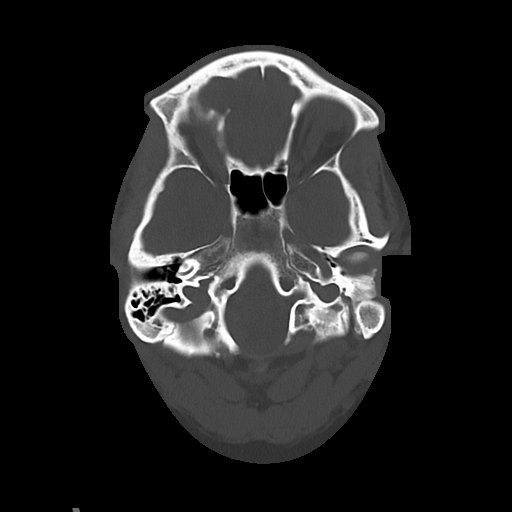
[im 25/84  bone]
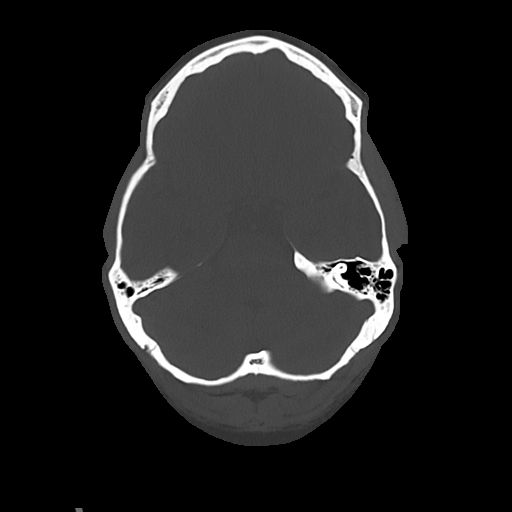
[im 38/84  bone]
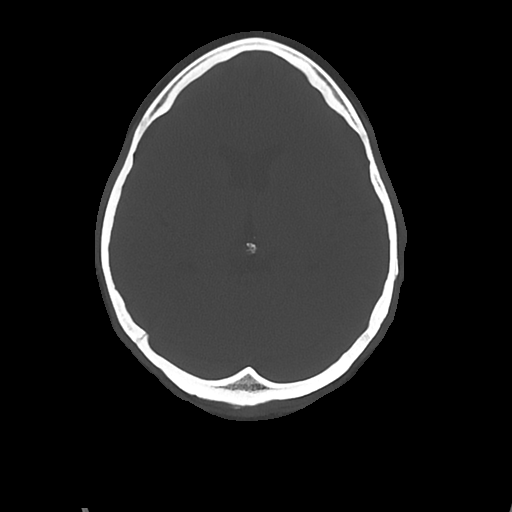

[Series 4: cor soft · coronal · 0.35mm/px · 3 of 72 slices shown]
[im 32/72  brain]
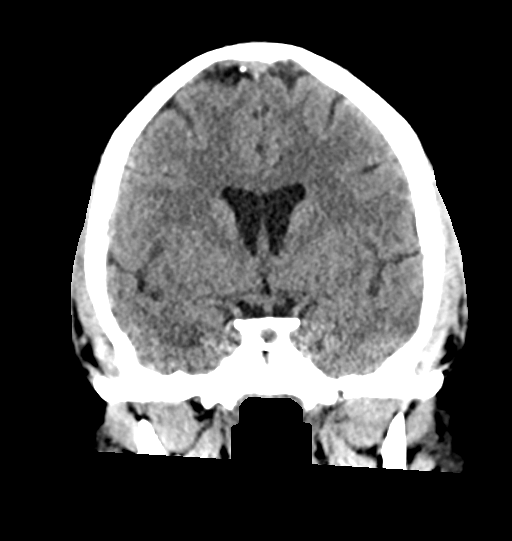
[im 40/72  brain]
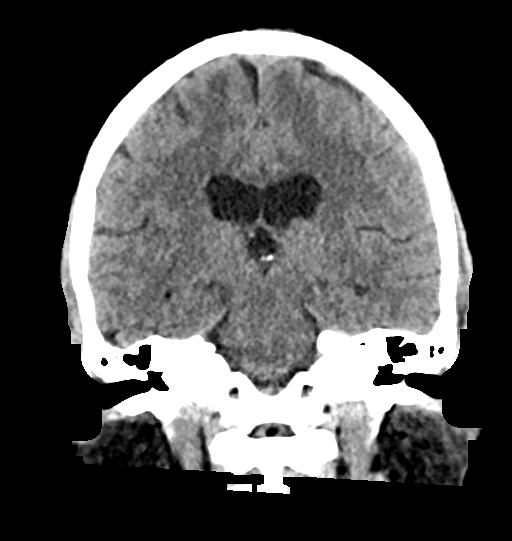
[im 47/72  brain]
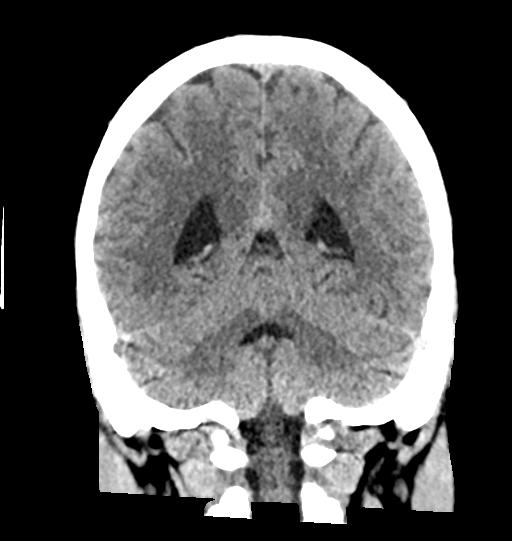

[Series 5: sag soft · sagittal · 0.38mm/px · 3 of 60 slices shown]
[im 20/60  brain]
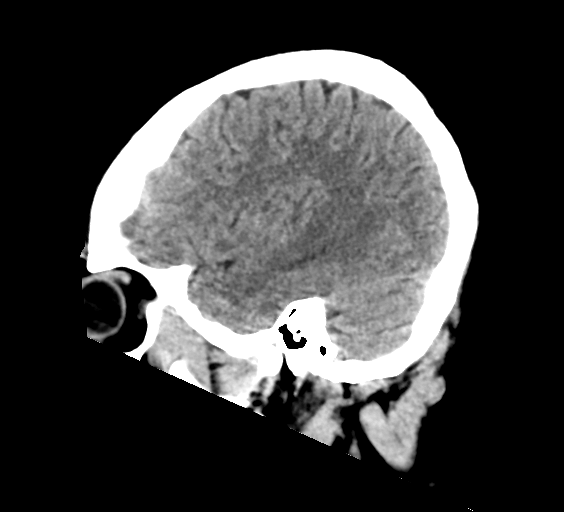
[im 30/60  brain]
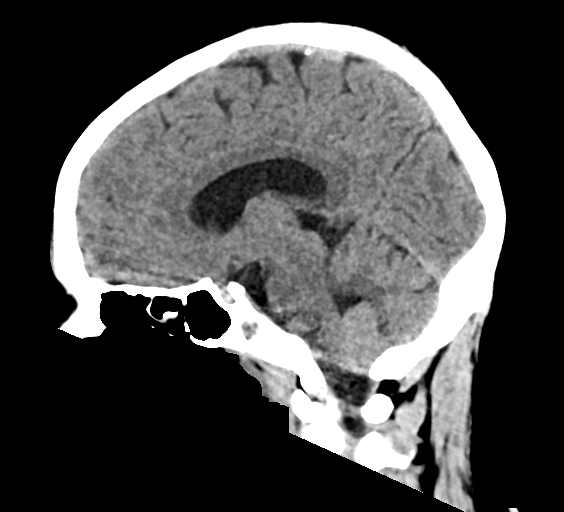
[im 40/60  brain]
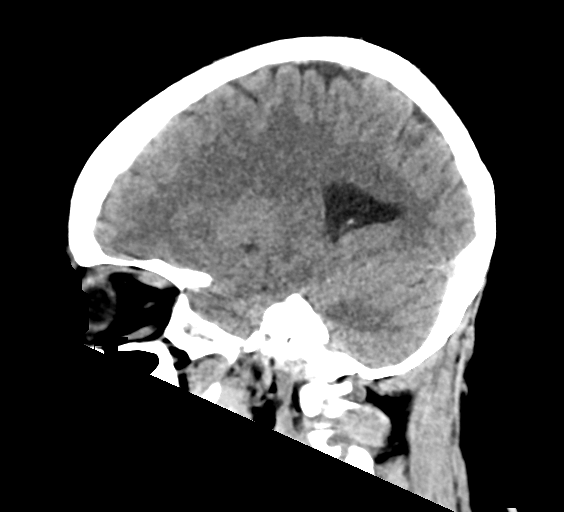

[17 of 47 positions shown; findings below may reference images not displayed]

FINDINGS: Brain:

There is no acute intracranial hemorrhage.

No demarcated cortical infarct.

No extra-axial fluid collection.

No evidence of an intracranial mass.

No midline shift.

Vascular: No hyperdense vessel.

Skull: Normal. Negative for fracture or focal lesion.

Sinuses/Orbits: Visualized orbits show no acute finding. Trace
scattered paranasal sinus mucosal thickening at the imaged levels.
IMPRESSION: No evidence of acute intracranial abnormality.

## 2022-05-06 IMAGING — CT CT HEAD W/O CM
4 of 8 series · 16 of 47 positions shown, 17 images · non-contrast
Comparison: 03/07/2021

CLINICAL DATA: Seizure, new-onset, no history of trauma



[Series 2: head bone · axial · 0.43mm/px · z∈[-108,-8]mm · 7 of 76 slices shown]
[im 7/76  bone]
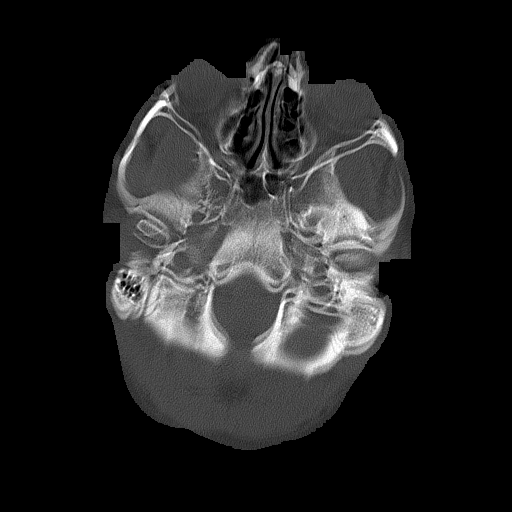
[im 19/76  bone]
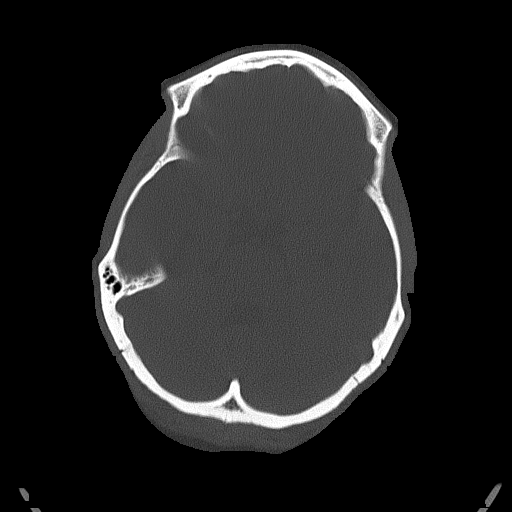
[im 26/76  bone]
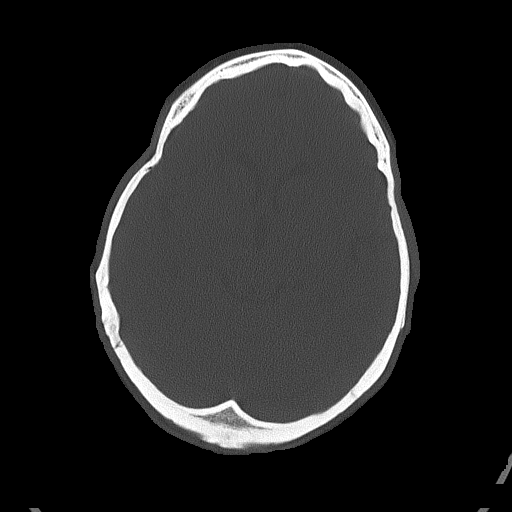
[im 32/76  bone]
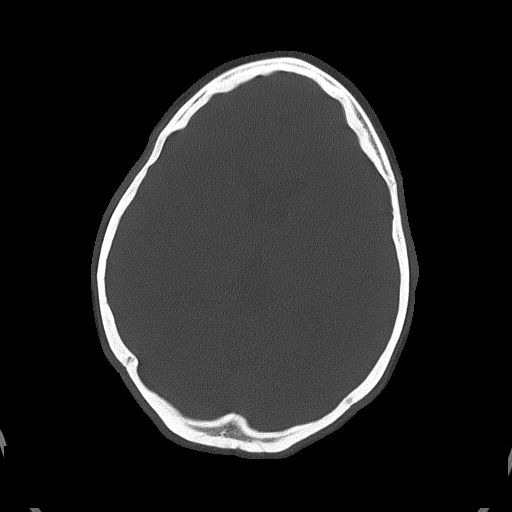
[im 44/76  bone]
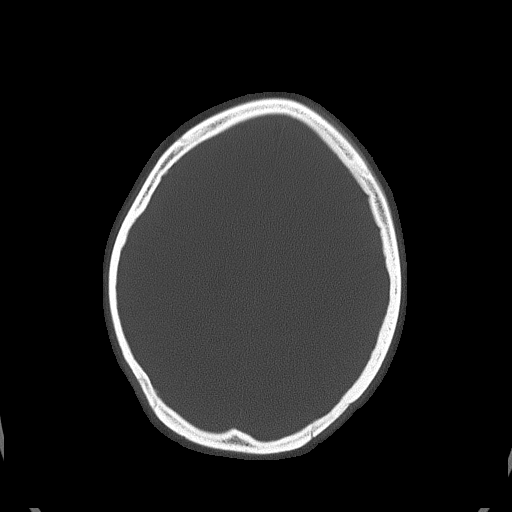
[im 51/76  bone]
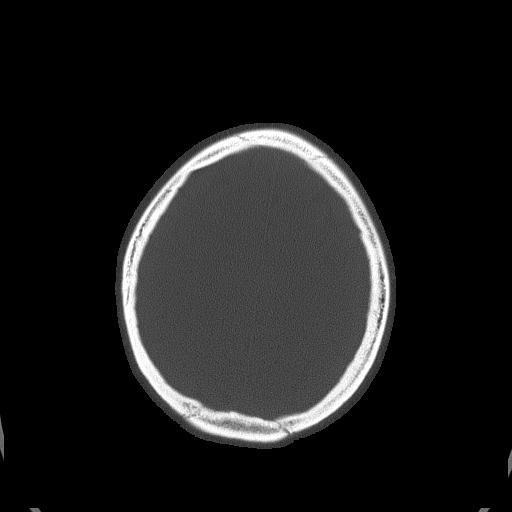
[im 57/76  bone]
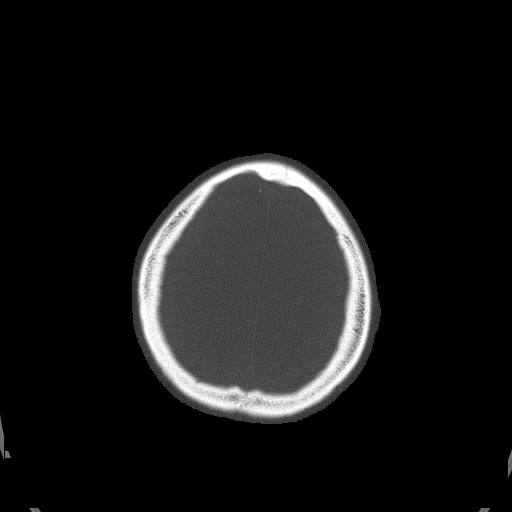

[Series 3: head wo · axial · 0.43mm/px · z∈[-90,+0]mm · 4 of 31 slices shown, 5 images]
[im 7/31  brain]
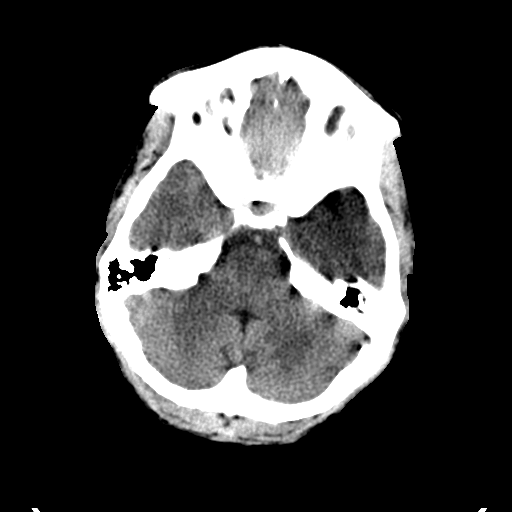
[im 7/31  bone]
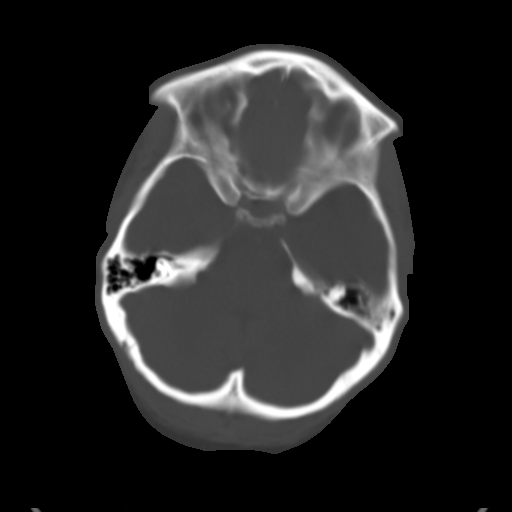
[im 13/31  brain]
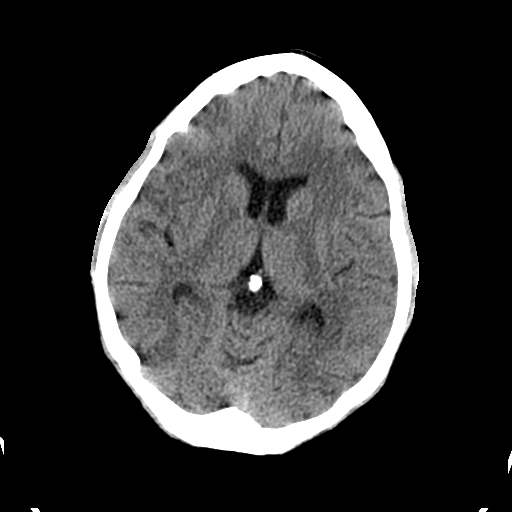
[im 19/31  brain]
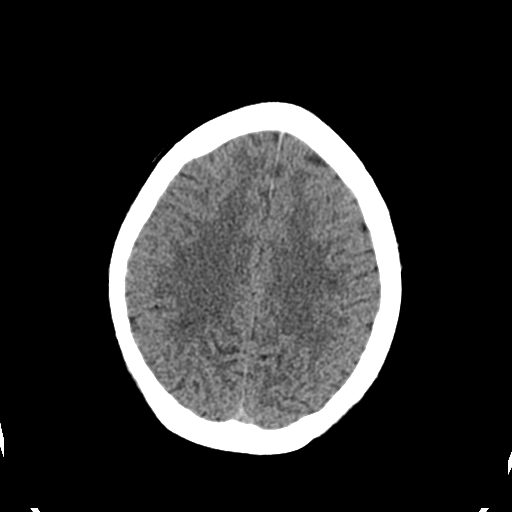
[im 25/31  brain]
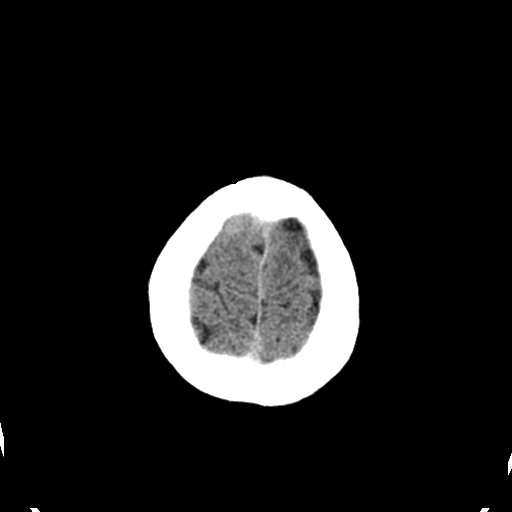

[Series 4: coronal soft tissue · coronal · 0.29mm/px · 3 of 68 slices shown]
[im 23/68  brain]
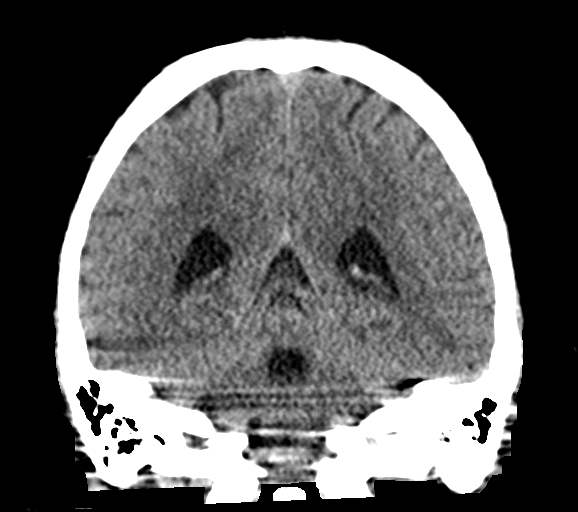
[im 35/68  brain]
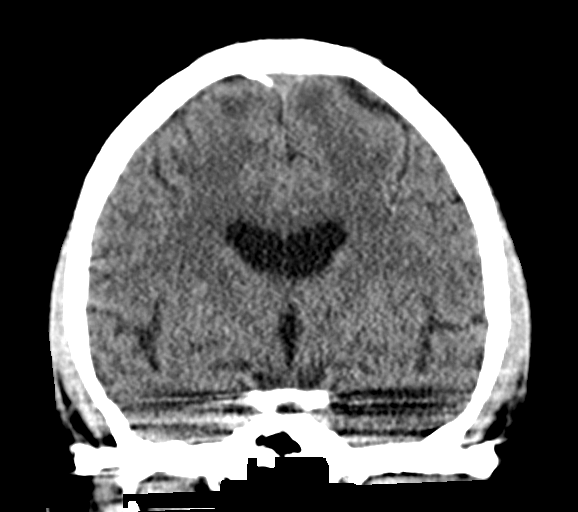
[im 46/68  brain]
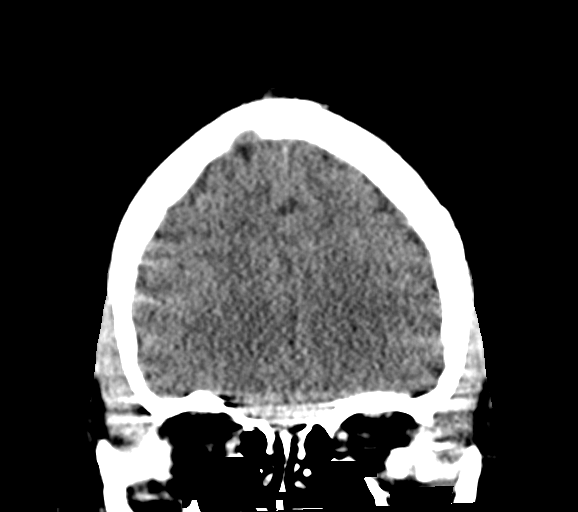

[Series 9: sagittal soft tissue · sagittal · 0.18mm/px · 2 of 57 slices shown]
[im 19/57  brain]
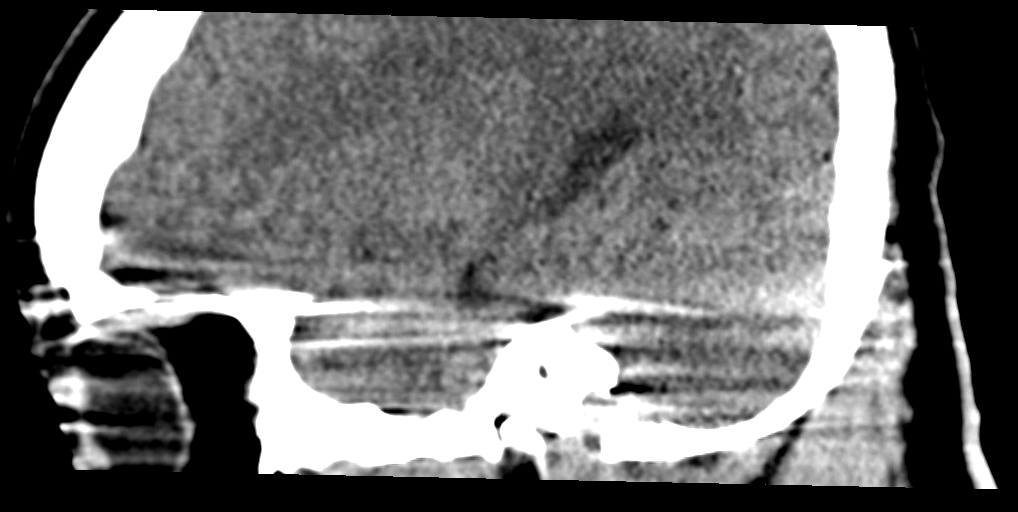
[im 38/57  brain]
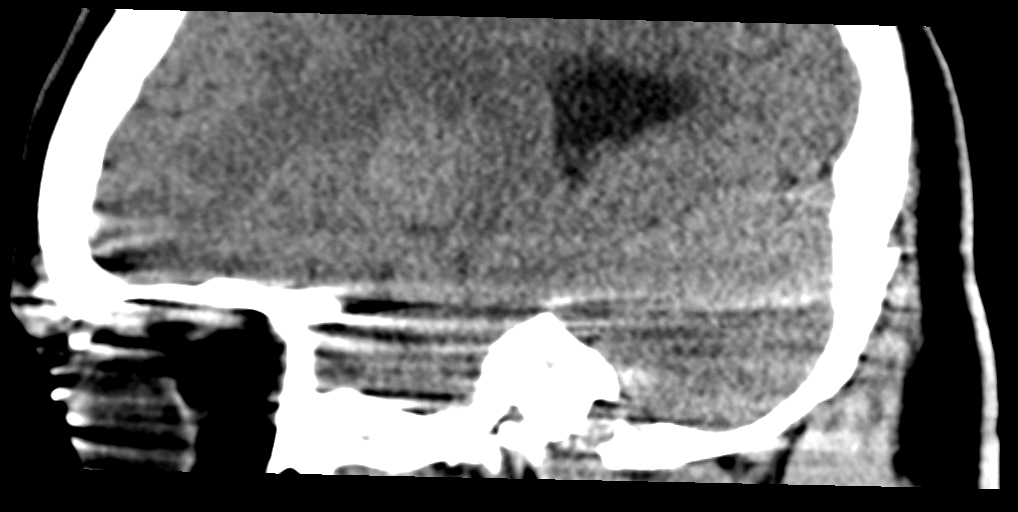

[16 of 47 positions shown; findings below may reference images not displayed]

FINDINGS: Brain: No evidence of acute infarction, hemorrhage, hydrocephalus,
extra-axial collection or mass lesion/mass effect.

Vascular: No hyperdense vessel or unexpected calcification.

Skull: Normal. Negative for fracture or focal lesion.

Sinuses/Orbits: No acute finding.

Other: None.
IMPRESSION: No acute intracranial abnormality.

## 2022-05-25 ENCOUNTER — Emergency Department
Admission: EM | Admit: 2022-05-25 | Discharge: 2022-05-25 | Discharge status: Against Medical Advice | Payer: Medicaid Other | Attending: Emergency Medicine | Admitting: Emergency Medicine

## 2022-05-25 ENCOUNTER — Other Ambulatory Visit: Payer: Self-pay

## 2022-05-25 DIAGNOSIS — R569 Unspecified convulsions: Secondary | ICD-10-CM | POA: Diagnosis present

## 2022-05-25 DIAGNOSIS — Z5321 Procedure and treatment not carried out due to patient leaving prior to being seen by health care provider: Secondary | ICD-10-CM | POA: Diagnosis not present

## 2022-05-25 LAB — BASIC METABOLIC PANEL
Anion gap: 11 (ref 5–15)
BUN: 9 mg/dL (ref 6–20)
CO2: 25 mmol/L (ref 22–32)
Calcium: 9.4 mg/dL (ref 8.9–10.3)
Chloride: 102 mmol/L (ref 98–111)
Creatinine, Ser: 0.37 mg/dL — ABNORMAL LOW (ref 0.44–1.00)
GFR, Estimated: >60 mL/min (ref >60)
Glucose, Bld: 155 mg/dL — ABNORMAL HIGH (ref 70–99)
Potassium: 3.1 mmol/L — ABNORMAL LOW (ref 3.5–5.1)
Sodium: 138 mmol/L (ref 135–145)

## 2022-05-25 LAB — CBC
HCT: 36.7 % (ref 36.0–46.0)
Hemoglobin: 12.2 g/dL (ref 12.0–15.0)
MCH: 24.8 pg — ABNORMAL LOW (ref 26.0–34.0)
MCHC: 33.2 g/dL (ref 30.0–36.0)
MCV: 74.7 fL — ABNORMAL LOW (ref 80.0–100.0)
Platelets: 256 10*3/uL (ref 150–400)
RBC: 4.91 MIL/uL (ref 3.87–5.11)
RDW: 13 % (ref 11.5–15.5)
WBC: 11.1 10*3/uL — ABNORMAL HIGH (ref 4.0–10.5)
nRBC: 0.2 % (ref 0.0–0.2)

## 2022-05-25 NOTE — ED Triage Notes (Signed)
Pt in via EMS from home with c/o multiple seizures. Pt able to speak while having a seizure. Pt with hx of pseudoseizures. HR 128

## 2022-05-25 NOTE — ED Triage Notes (Signed)
Pt presents via EMS c/o seizures since Friday. Pt actively shaking but speaking in full sentences as this time. Pt has a hx of similar episodes in the past.

## 2022-05-25 NOTE — ED Notes (Signed)
Pt no longer seen in recliner, pt was dressed in a hospital gown and had bag of clothes, clothes also not seen.  Pt has been called for room but did not answer in the lobby.

## 2022-06-02 IMAGING — CT CT ABD-PELV W/ CM
2 of 4 series · 16 of 46 positions shown, 18 images · IV contrast (agent unspecified)
Comparison: 02/12/2021

CLINICAL DATA: Nausea and vomiting with abdominal pain

EXAM:
CT ABDOMEN AND PELVIS WITH CONTRAST
TECHNIQUE: Multidetector CT imaging of the abdomen and pelvis was performed
using the standard protocol following bolus administration of
intravenous contrast.

[Series 2: routine abd/pel with · axial · 0.89mm/px · z∈[-757,-337]mm · 13 of 93 slices shown, 15 images]
[im 5/93  soft-tissue]
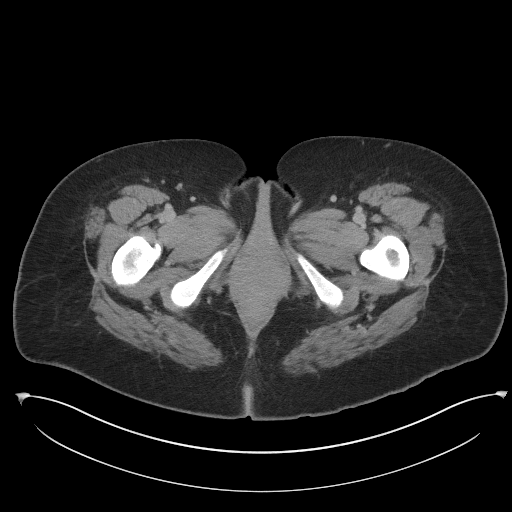
[im 5/93  bone]
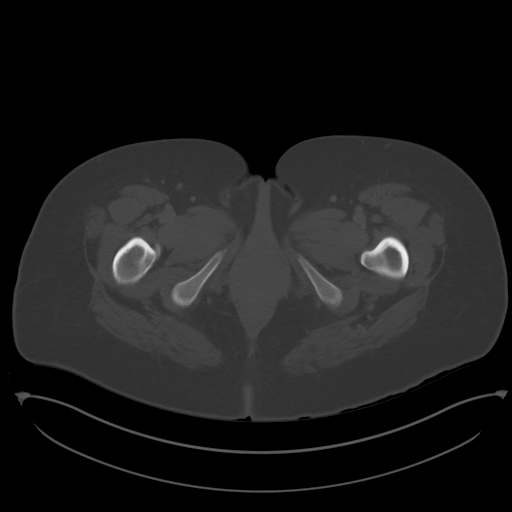
[im 13/93  soft-tissue]
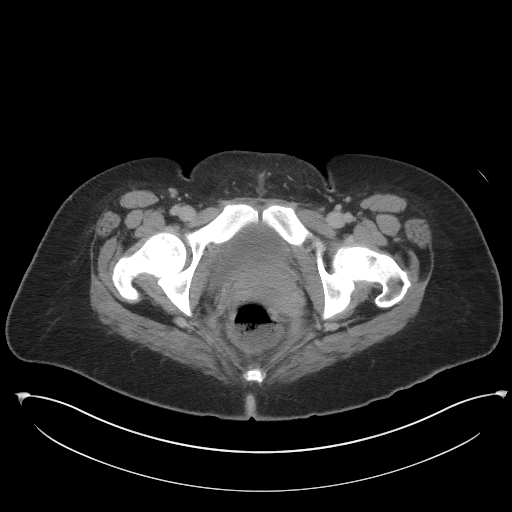
[im 21/93  soft-tissue]
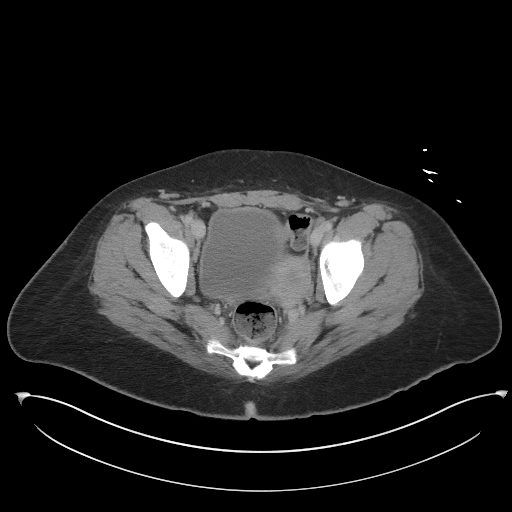
[im 25/93  soft-tissue]
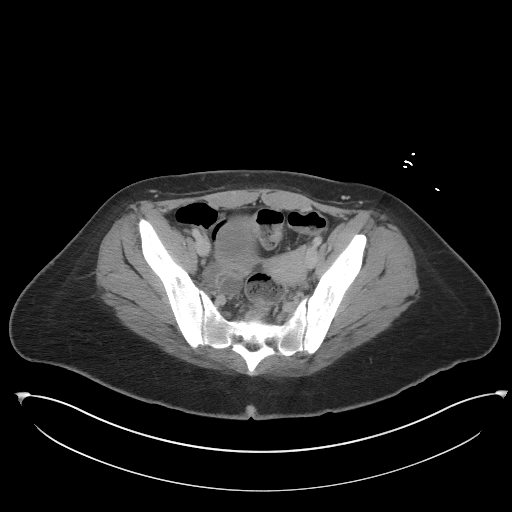
[im 33/93  soft-tissue]
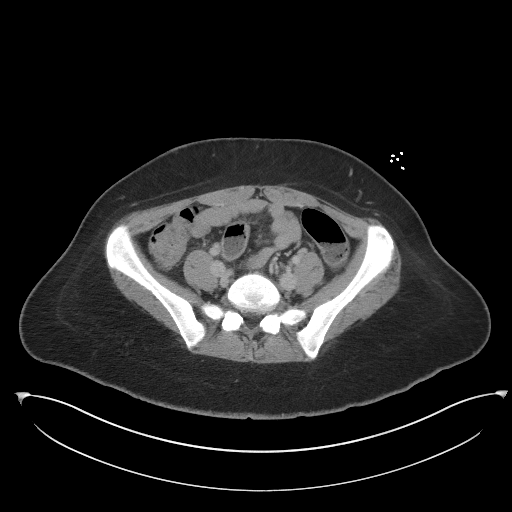
[im 41/93  soft-tissue]
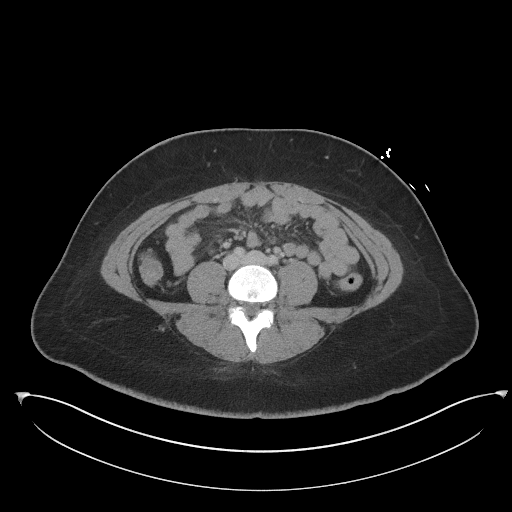
[im 49/93  soft-tissue]
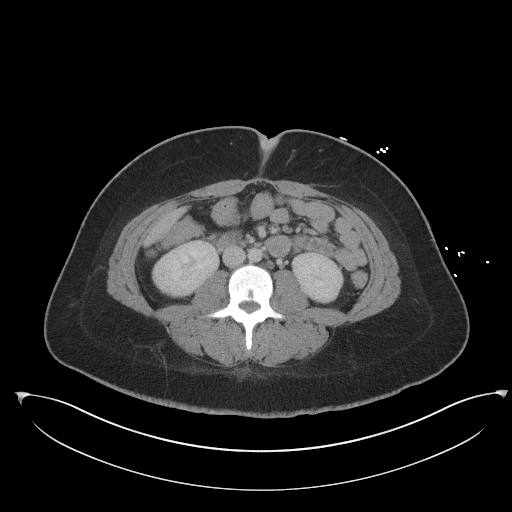
[im 53/93  soft-tissue]
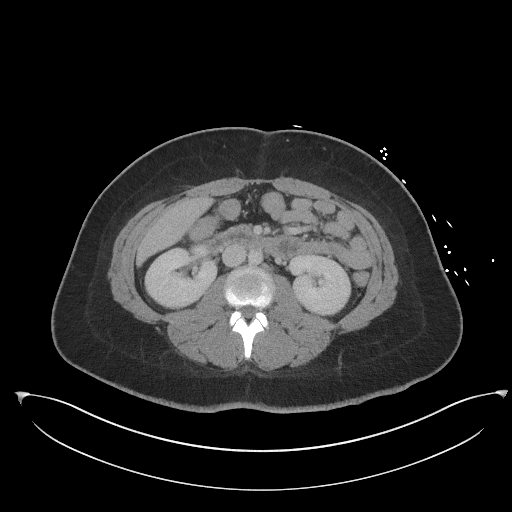
[im 61/93  soft-tissue]
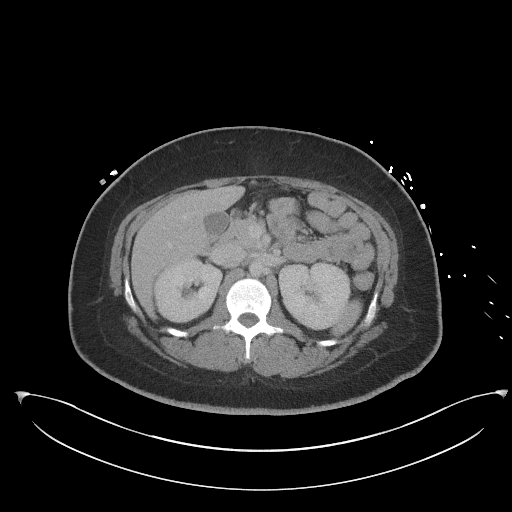
[im 61/93  bone]
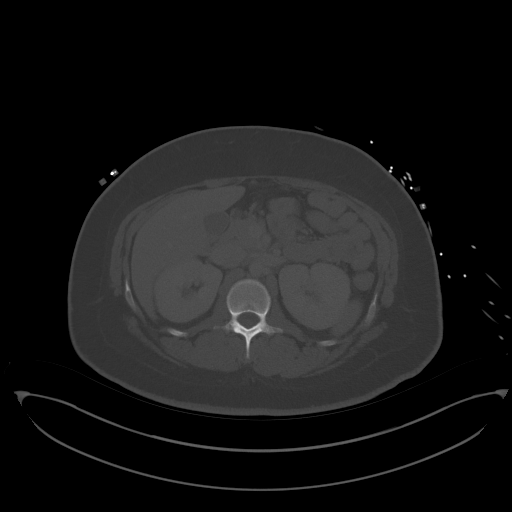
[im 69/93  soft-tissue]
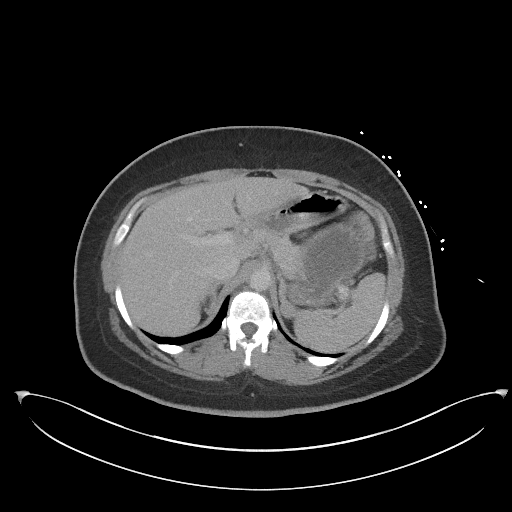
[im 73/93  soft-tissue]
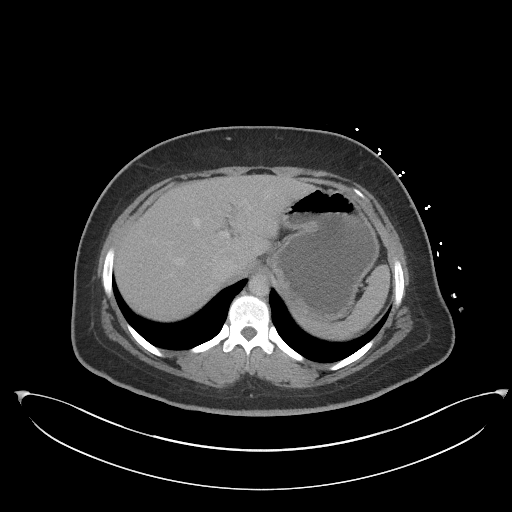
[im 81/93  soft-tissue]
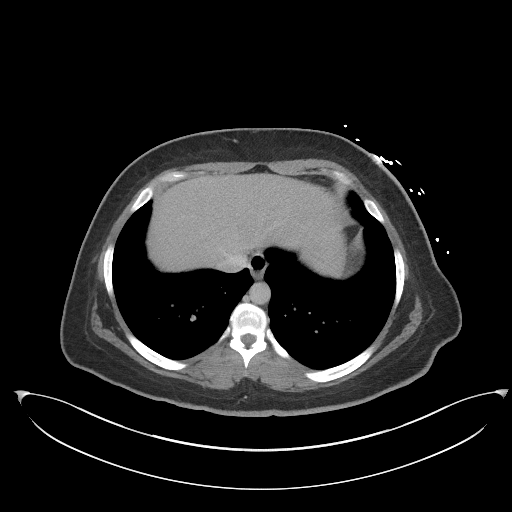
[im 89/93  soft-tissue]
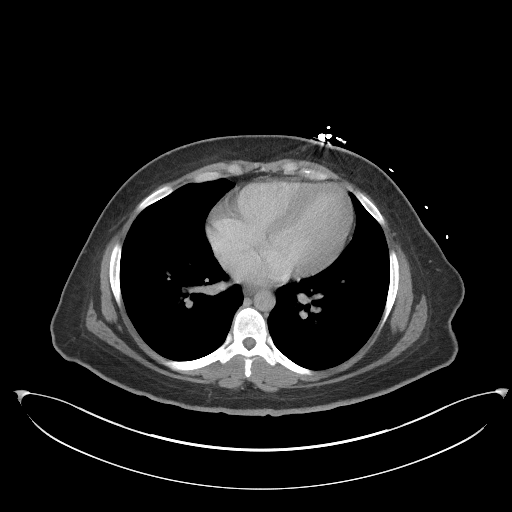

[Series 5: coronal st · coronal · 0.76mm/px · 3 of 88 slices shown]
[im 30/88  soft-tissue]
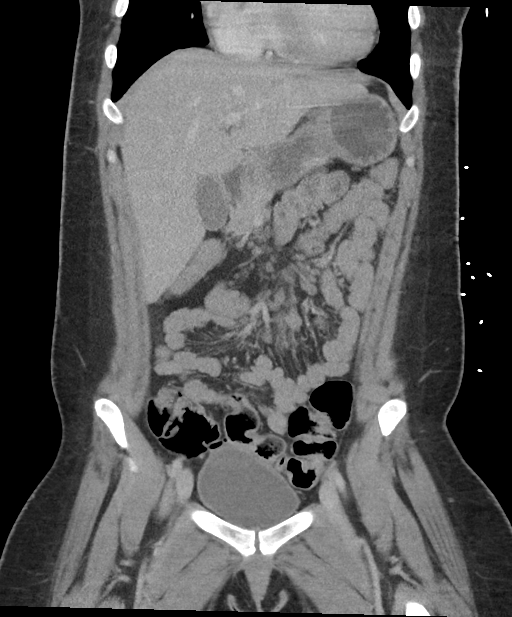
[im 39/88  soft-tissue]
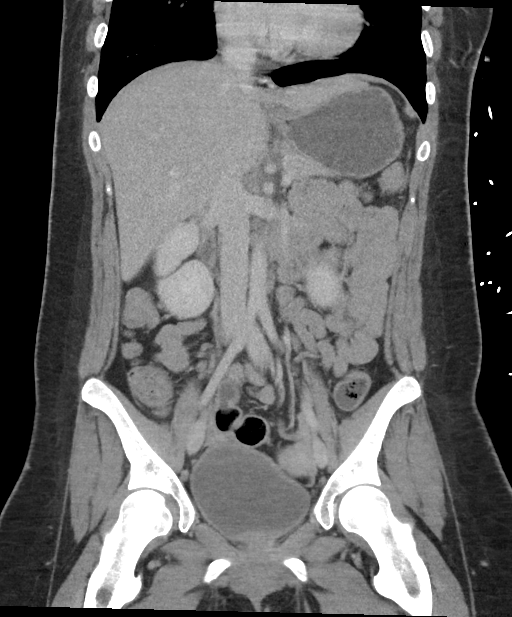
[im 49/88  soft-tissue]
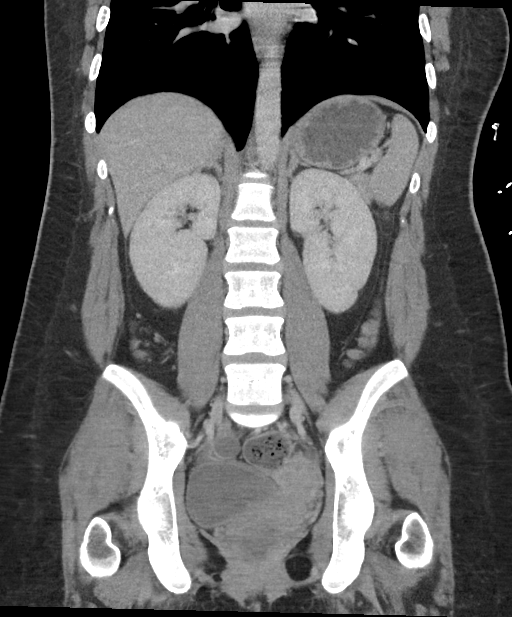

[16 of 46 positions shown; findings below may reference images not displayed]

RADIATION DOSE REDUCTION: This exam was performed according to the
departmental dose-optimization program which includes automated
exposure control, adjustment of the mA and/or kV according to
patient size and/or use of iterative reconstruction technique.

CONTRAST:  100mL OMNIPAQUE IOHEXOL 300 MG/ML  SOLN
FINDINGS: Lower chest:  No contributory findings.

Hepatobiliary: No focal liver abnormality.No evidence of biliary
obstruction or stone.

Pancreas: Unremarkable.

Spleen: Unremarkable.

Adrenals/Urinary Tract: 14 mm left adrenal nodule stable since at
least 6964 and consistent with adenoma. No hydronephrosis or stone.
Unremarkable bladder.

Stomach/Bowel:  No obstruction. No appendicitis.

Vascular/Lymphatic: No acute vascular abnormality. No mass or
adenopathy.

Reproductive:No pathologic findings.  Corpus luteum on the right

Other: No ascites or pneumoperitoneum. Resolved left groin
inflammation.

Musculoskeletal: No acute abnormalities.
IMPRESSION: No acute finding. Negative for bowel obstruction or visible
inflammation.

## 2022-06-02 IMAGING — CT CT HEAD W/O CM
4 series · 17 of 47 positions shown, 19 images · non-contrast
Comparison: 05/06/2021

CLINICAL DATA: Dizziness and loss of consciousness



[Series 2: head wo · axial · 0.40mm/px · z∈[-79,+41]mm · 7 of 33 slices shown, 9 images]
[im 5/33  brain]
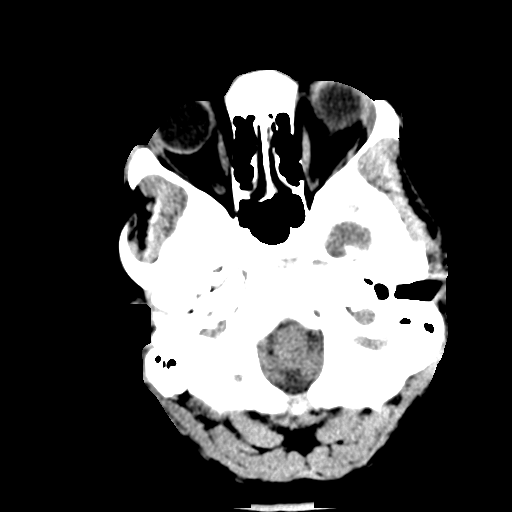
[im 5/33  bone]
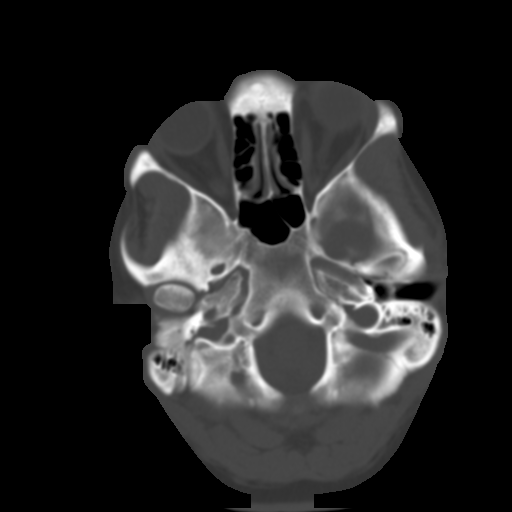
[im 9/33  brain]
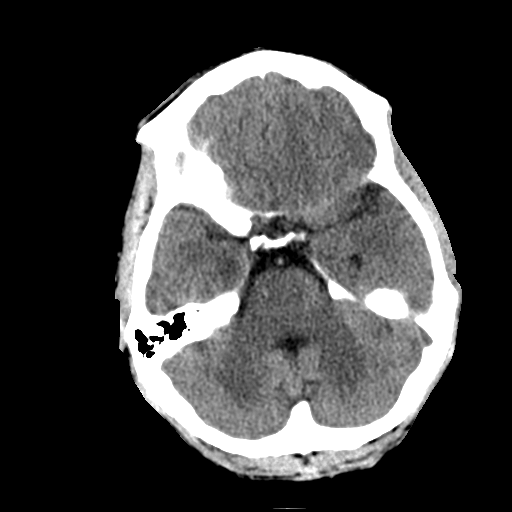
[im 13/33  brain]
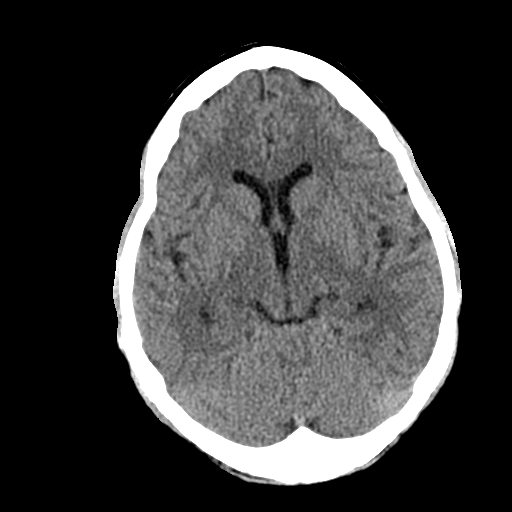
[im 17/33  brain]
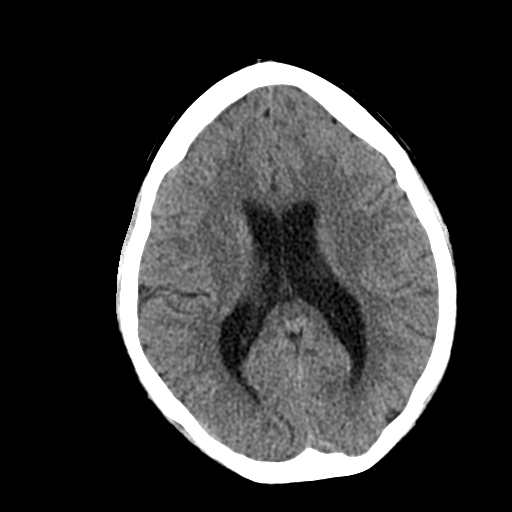
[im 21/33  brain]
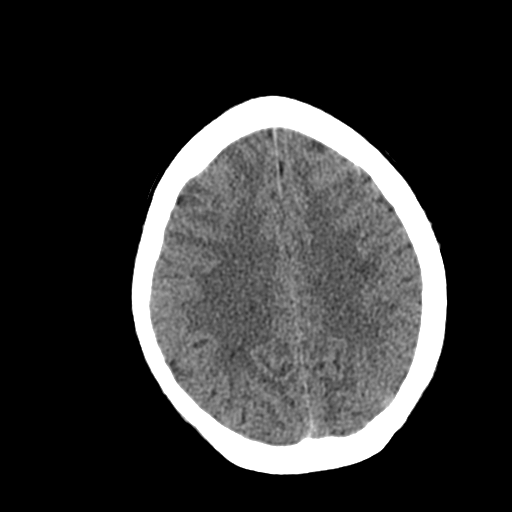
[im 21/33  bone]
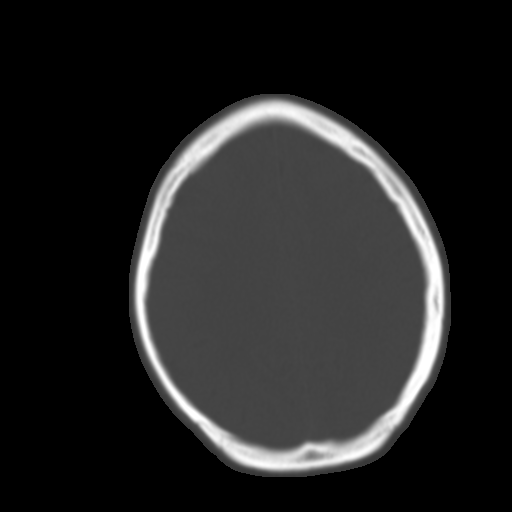
[im 25/33  brain]
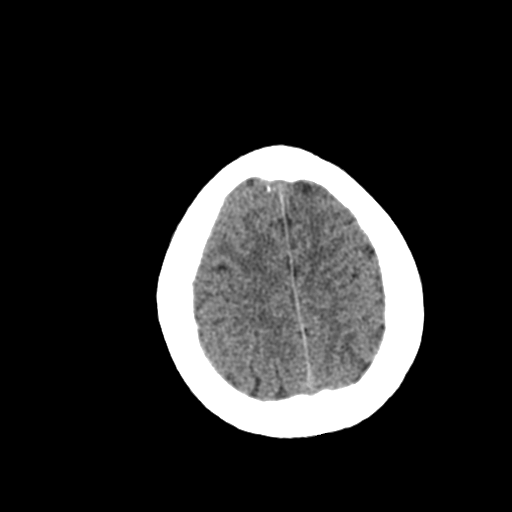
[im 29/33  brain]
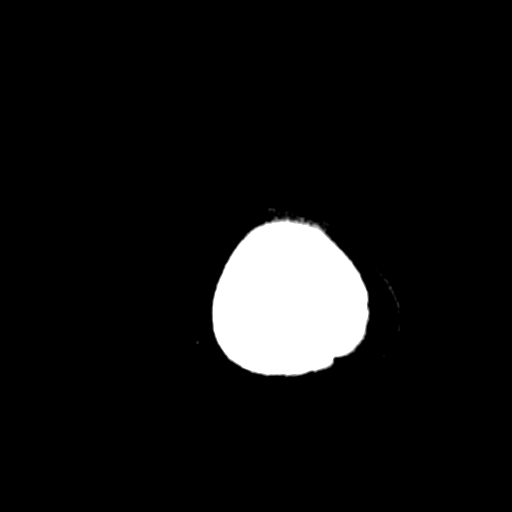

[Series 3: head bone · axial · 0.40mm/px · z∈[-83,-27]mm · 4 of 81 slices shown]
[im 9/81  bone]
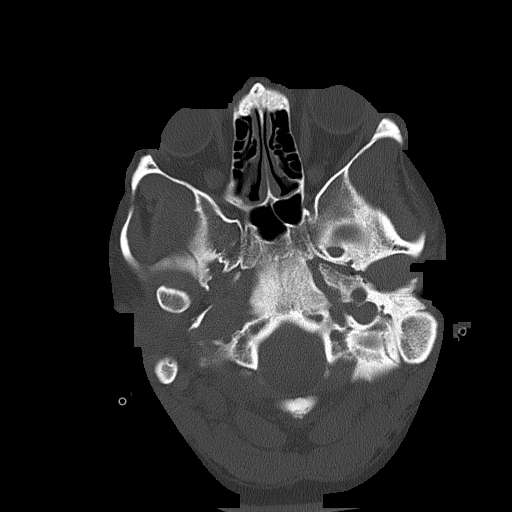
[im 17/81  bone]
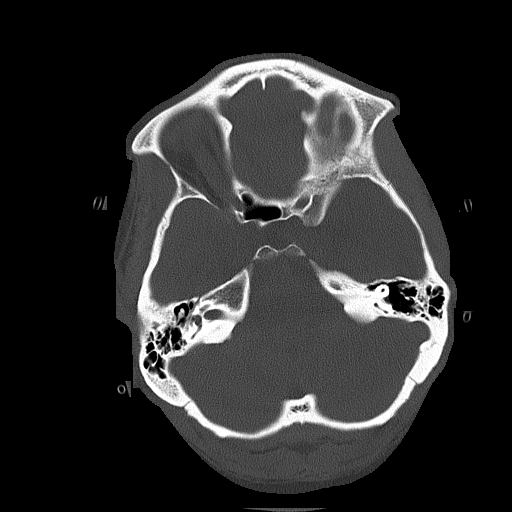
[im 25/81  bone]
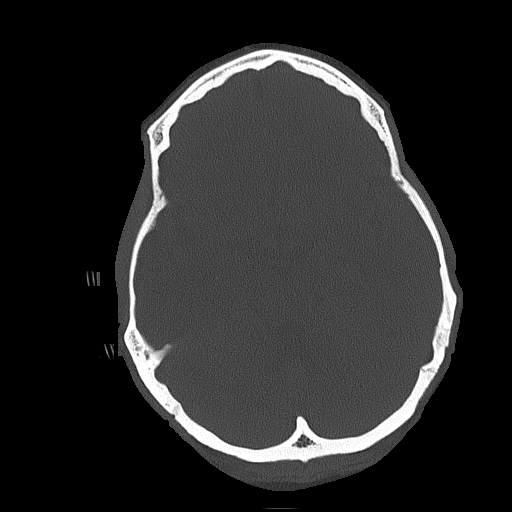
[im 37/81  bone]
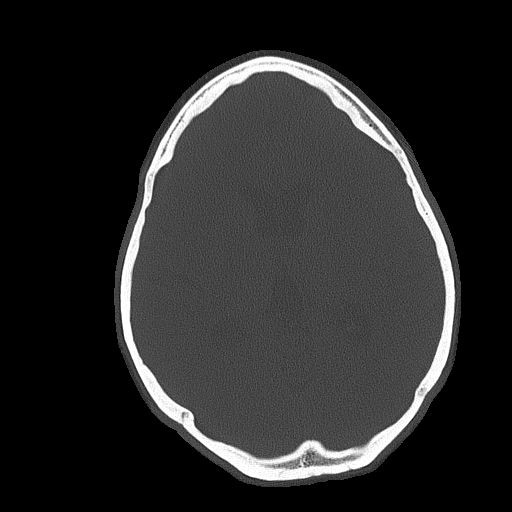

[Series 4: coronal soft tissue · coronal · 0.33mm/px · 3 of 66 slices shown]
[im 22/66  brain]
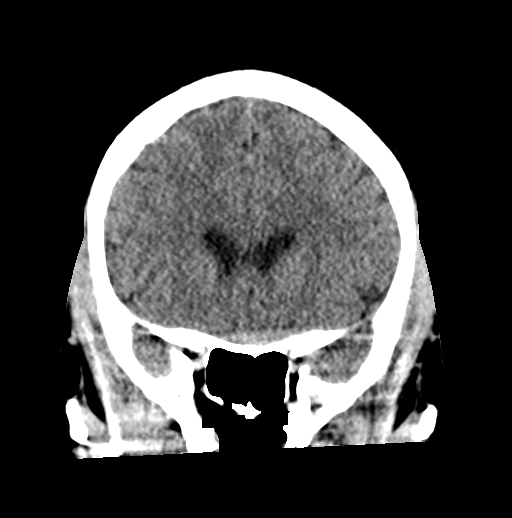
[im 29/66  brain]
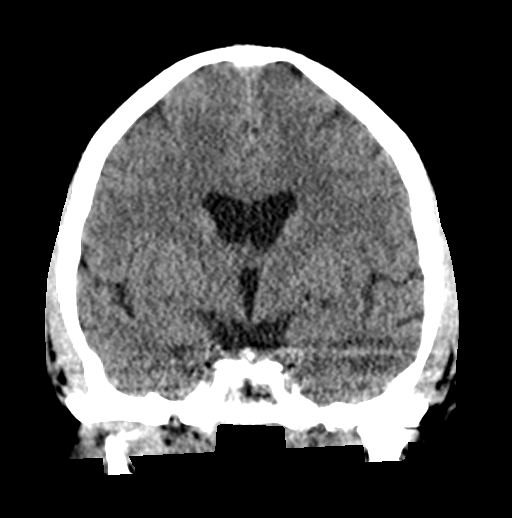
[im 37/66  brain]
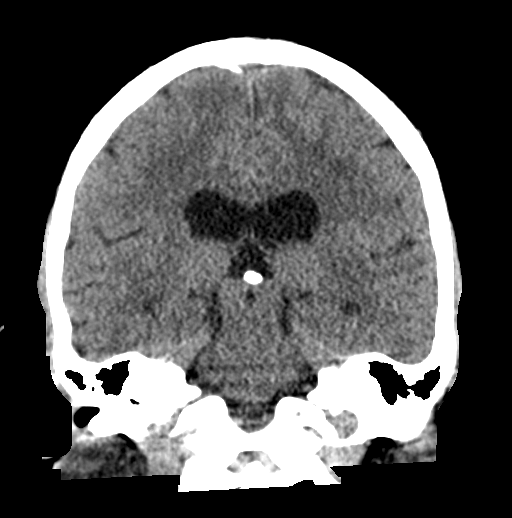

[Series 5: sagittal soft tissue · sagittal · 0.33mm/px · 3 of 55 slices shown]
[im 19/55  brain]
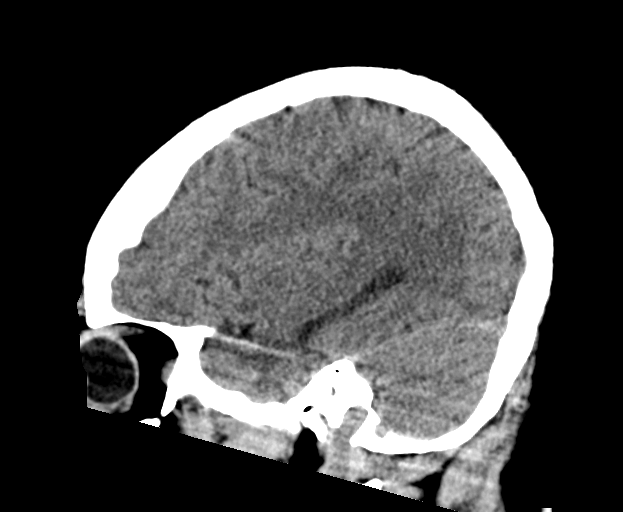
[im 28/55  brain]
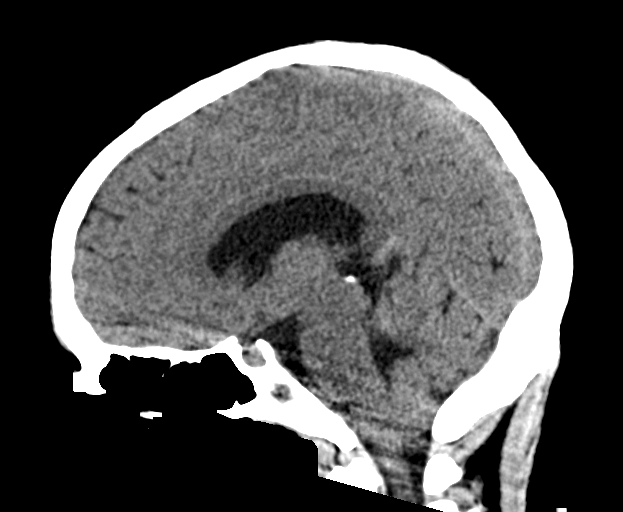
[im 37/55  brain]
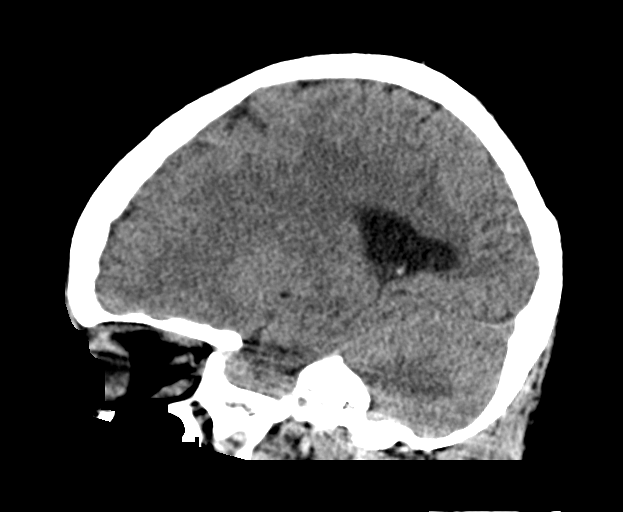

[17 of 47 positions shown; findings below may reference images not displayed]

FINDINGS: Brain: No evidence of acute infarction, hemorrhage, hydrocephalus,
extra-axial collection or mass lesion/mass effect.

Vascular: No hyperdense vessel or unexpected calcification.

Skull: Normal. Negative for fracture or focal lesion.

Sinuses/Orbits: No acute finding.
IMPRESSION: Negative head CT.

## 2022-06-02 IMAGING — DX DG CHEST 1V PORT
1 series · 1 of 1 positions shown · non-contrast
Comparison: 03/07/2021

CLINICAL DATA: Chest pain

EXAM:
PORTABLE CHEST 1 VIEW

[chest ap]
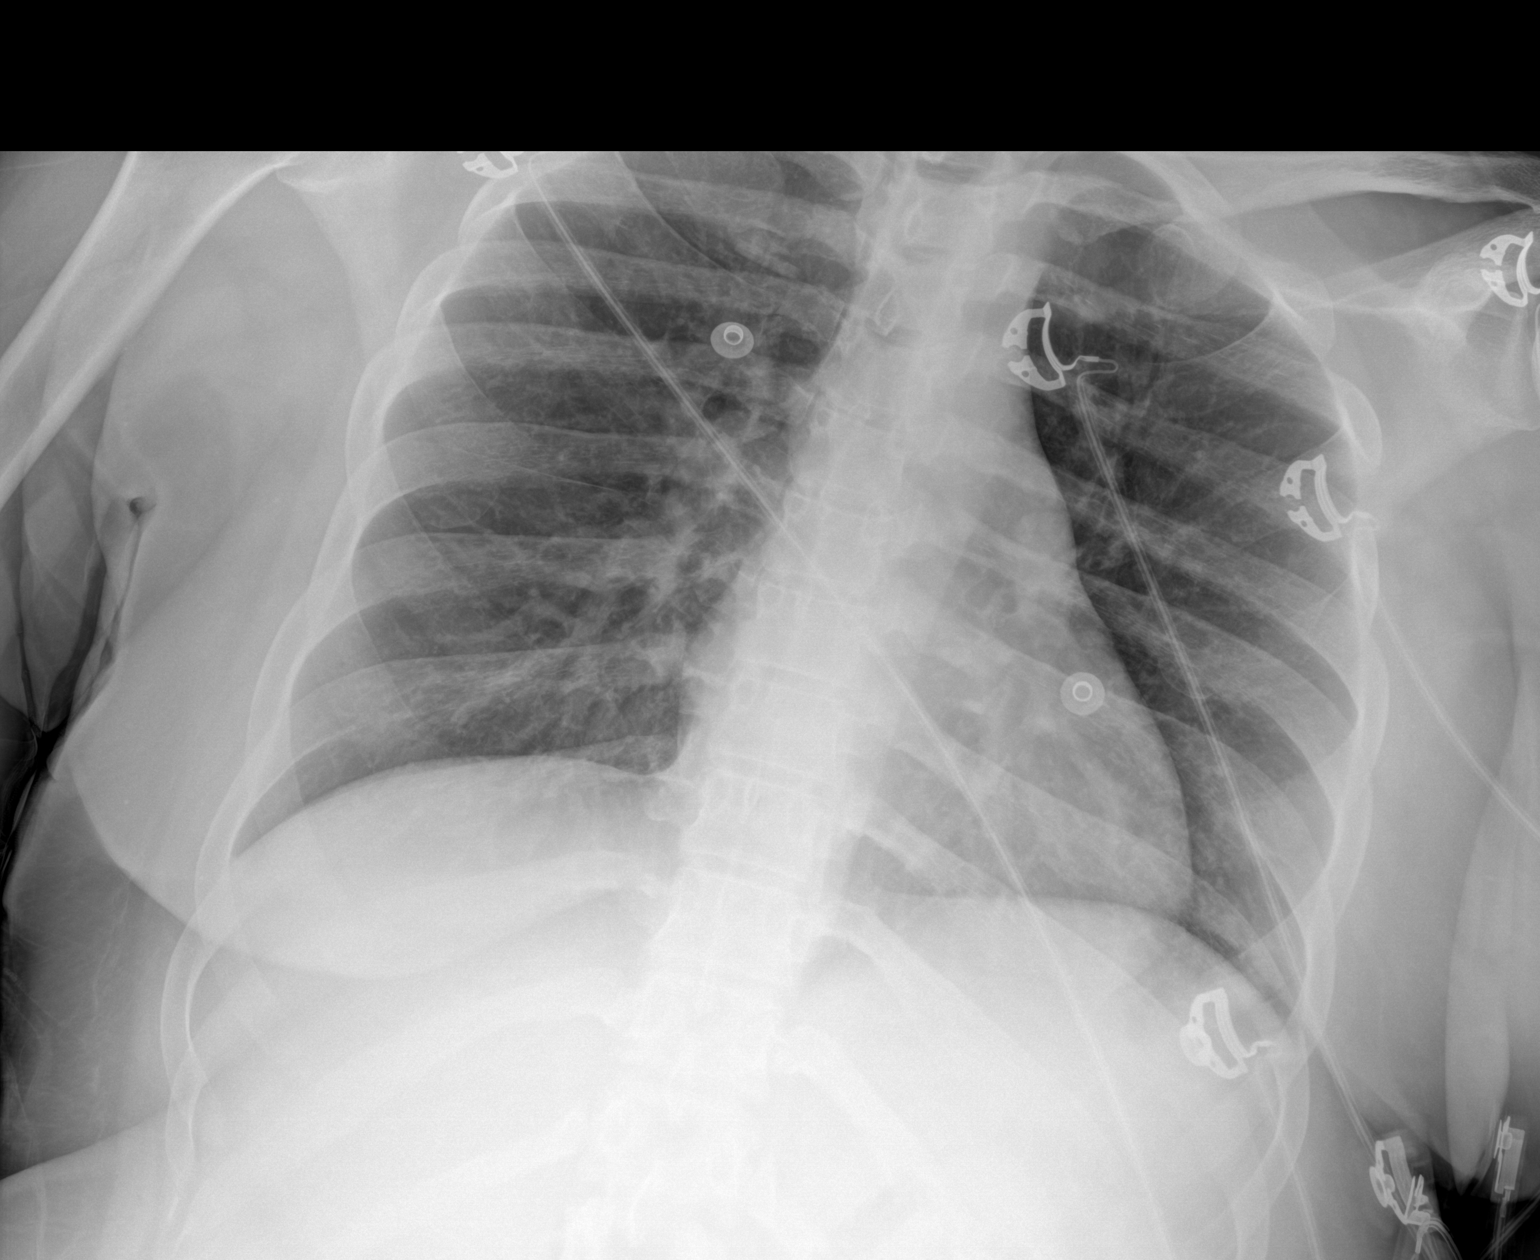

[1 of 1 positions shown; findings below may reference images not displayed]

FINDINGS: Artifact from EKG leads.

Normal heart size and mediastinal contours. No acute infiltrate or
edema. No effusion or pneumothorax. No acute osseous findings.
IMPRESSION: Negative portable chest.

## 2022-06-28 IMAGING — DX DG CHEST 1V PORT
1 series · 1 of 1 positions shown · non-contrast
Comparison: 06/02/2021

CLINICAL DATA: Chest pain

EXAM:
PORTABLE CHEST 1 VIEW

[chest ap]
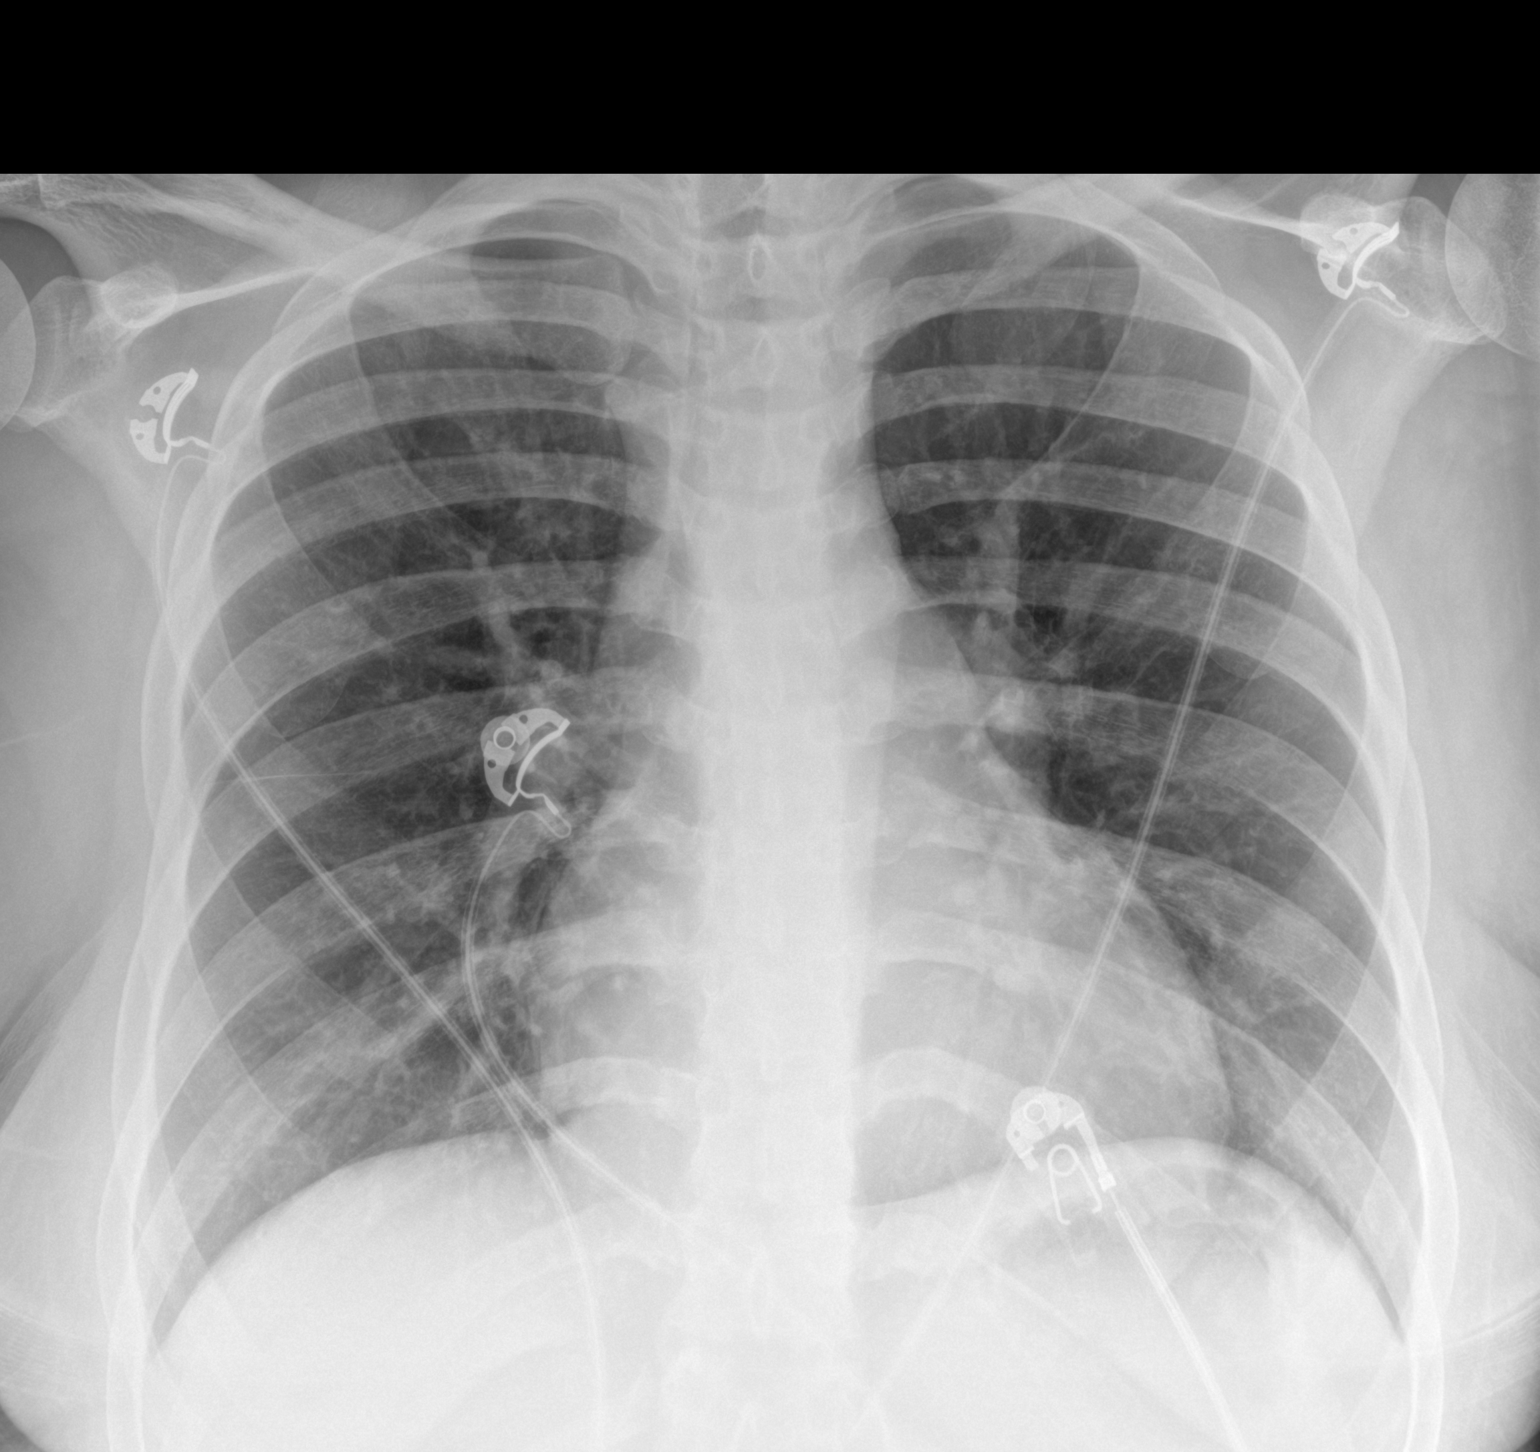

[1 of 1 positions shown; findings below may reference images not displayed]

FINDINGS: The heart size and mediastinal contours are within normal limits.
Both lungs are clear. The visualized skeletal structures are
unremarkable.
IMPRESSION: No active disease.

## 2022-06-28 IMAGING — CT CT ABD-PELV W/ CM
2 of 4 series · 15 of 46 positions shown, 17 images · IV contrast (agent unspecified)
Comparison: 06/02/2021

CLINICAL DATA: Abdominal pain, nausea and dizziness

EXAM:
CT ABDOMEN AND PELVIS WITH CONTRAST
TECHNIQUE: Multidetector CT imaging of the abdomen and pelvis was performed
using the standard protocol following bolus administration of
intravenous contrast.

[Series 2: routine abd/pel with · axial · 0.75mm/px · z∈[-1049,-604]mm · 12 of 99 slices shown, 14 images]
[im 5/99  soft-tissue]
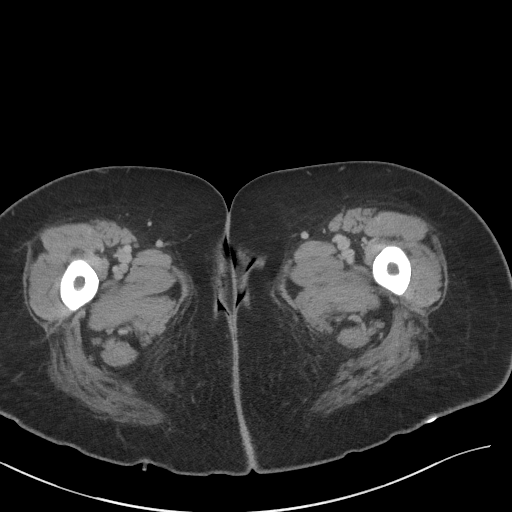
[im 5/99  bone]
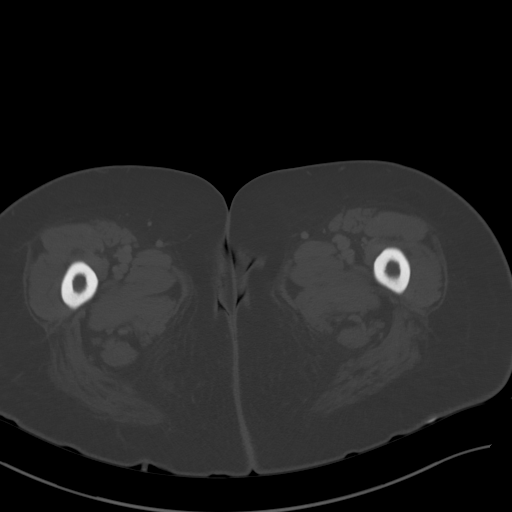
[im 13/99  soft-tissue]
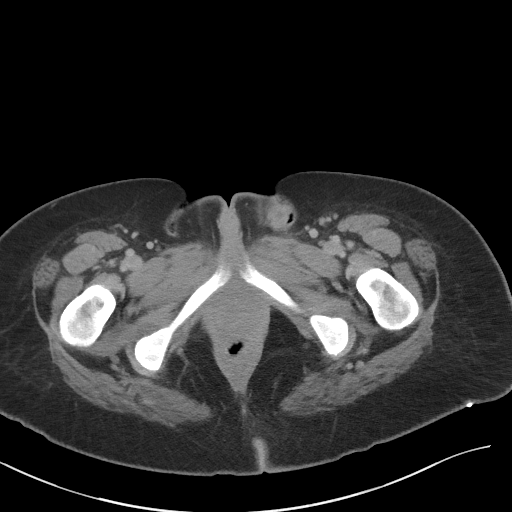
[im 21/99  soft-tissue]
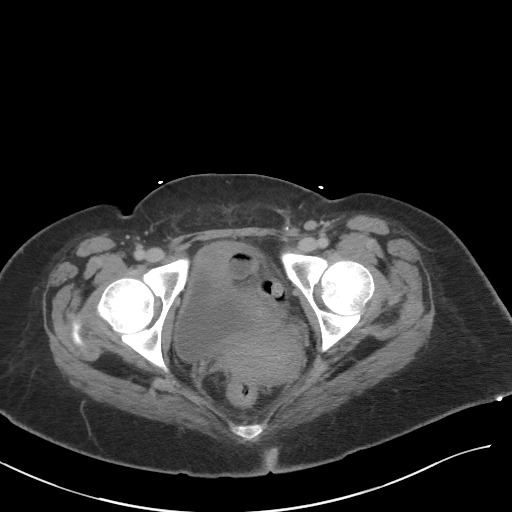
[im 29/99  soft-tissue]
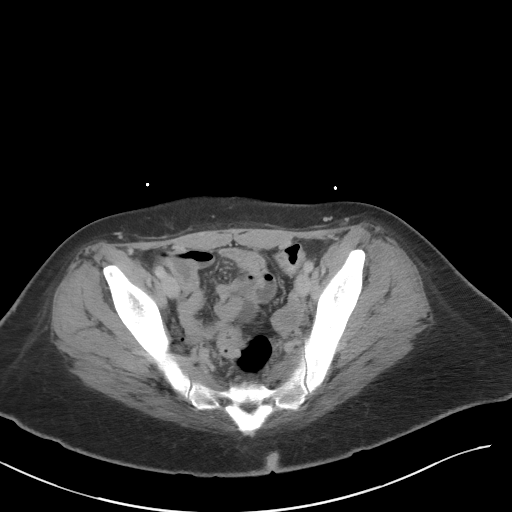
[im 37/99  soft-tissue]
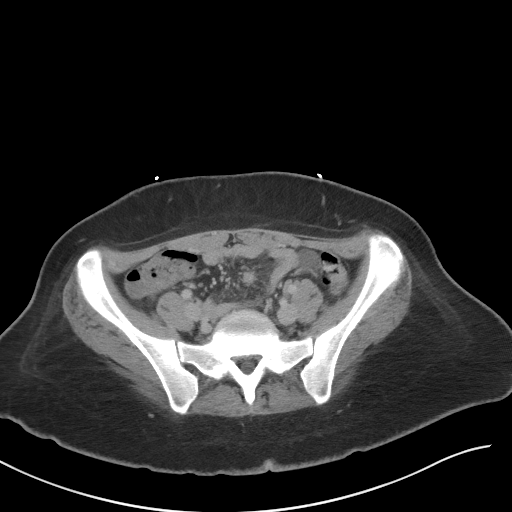
[im 45/99  soft-tissue]
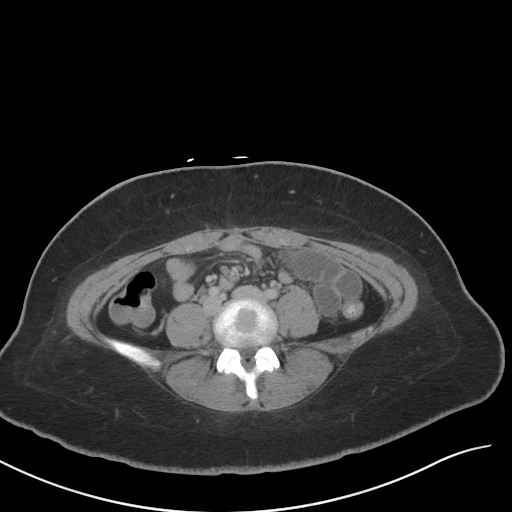
[im 54/99  soft-tissue]
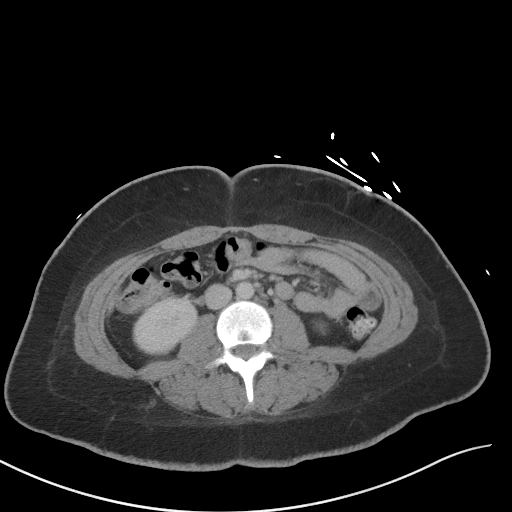
[im 62/99  soft-tissue]
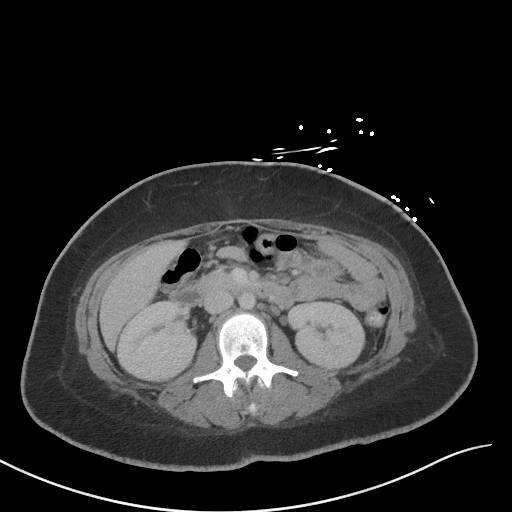
[im 70/99  soft-tissue]
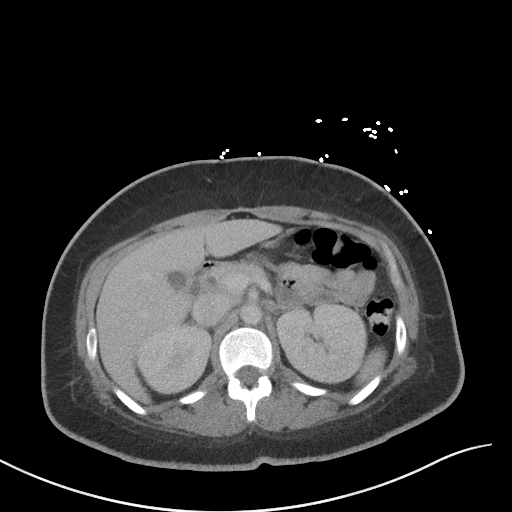
[im 70/99  bone]
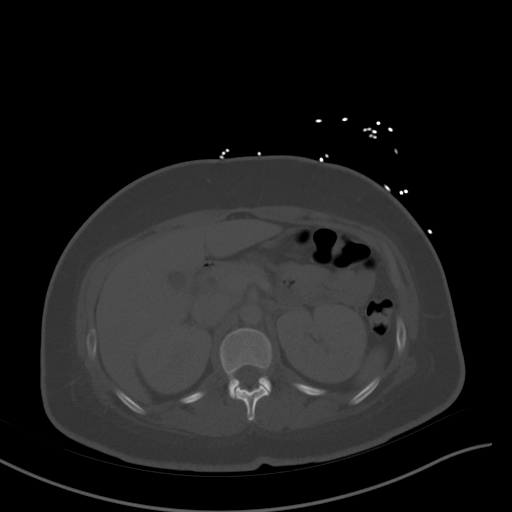
[im 78/99  soft-tissue]
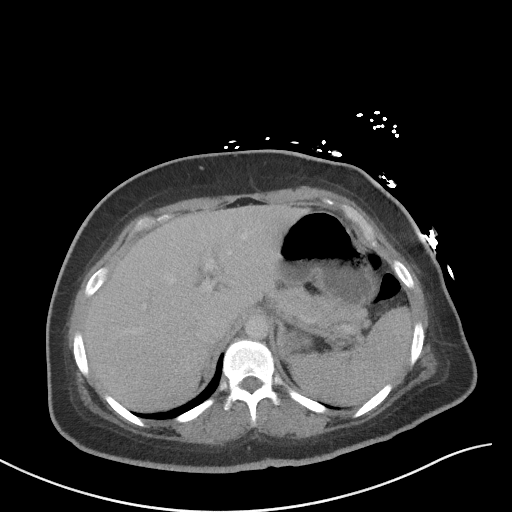
[im 86/99  soft-tissue]
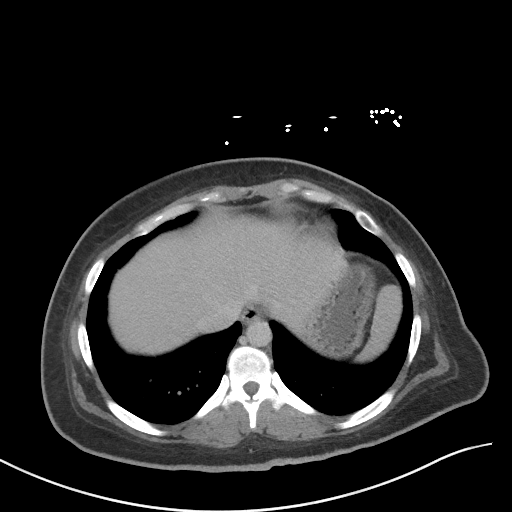
[im 94/99  soft-tissue]
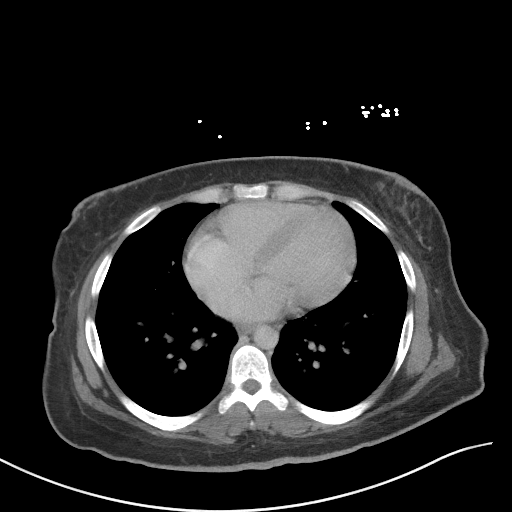

[Series 5: coronal st · coronal · 0.72mm/px · 3 of 85 slices shown]
[im 29/85  soft-tissue]
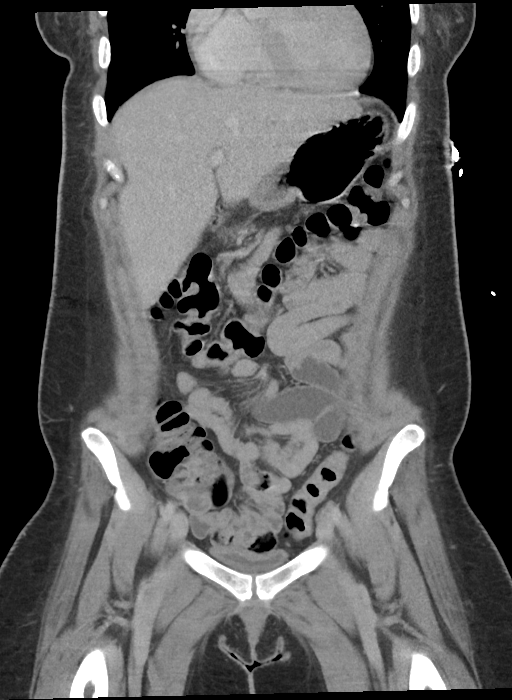
[im 38/85  soft-tissue]
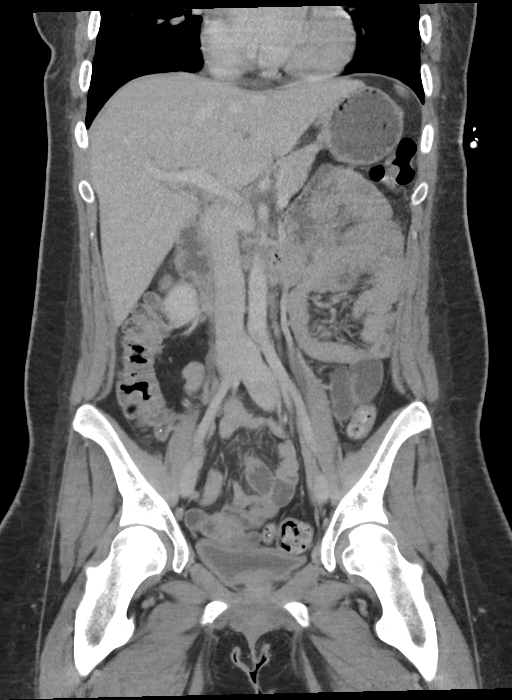
[im 47/85  soft-tissue]
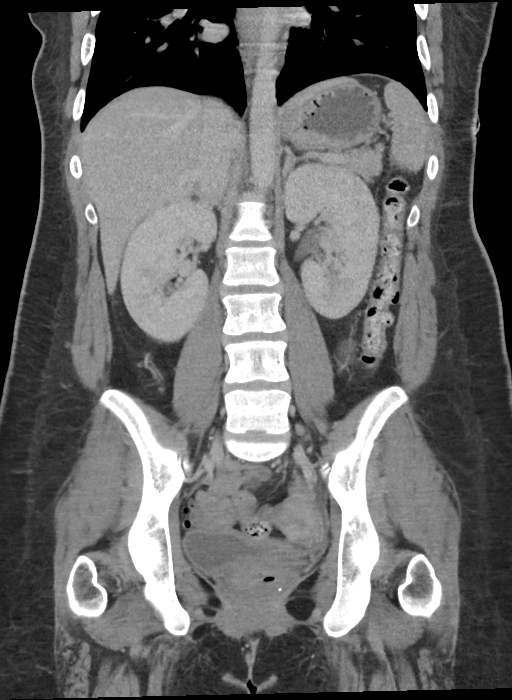

[15 of 46 positions shown; findings below may reference images not displayed]

RADIATION DOSE REDUCTION: This exam was performed according to the
departmental dose-optimization program which includes automated
exposure control, adjustment of the mA and/or kV according to
patient size and/or use of iterative reconstruction technique.

CONTRAST:  75mL OMNIPAQUE IOHEXOL 350 MG/ML SOLN
FINDINGS: Lower chest: Unremarkable.

Hepatobiliary: Liver measures 18.3 cm in length. No focal
abnormality is seen. There is no dilation of bile ducts. Gallbladder
is not distended.

Pancreas: No focal abnormality is seen.

Spleen: Unremarkable.

Adrenals/Urinary Tract: Adrenals are unremarkable. There is no
hydronephrosis. There are no renal or ureteral stones. Urinary
bladder is not distended.

Stomach/Bowel: Stomach is unremarkable. Small bowel loops are not
dilated. Appendix is difficult to visualize. There is a small
caliber tubular structure with air in the lumen in the right pelvic
cavity, possibly normal appendix. There is no pericecal
inflammation. There is no significant wall thickening in colon.
There is no pericolic stranding.

Vascular/Lymphatic: Unremarkable.

Reproductive: Uterus is to the left of midline. There are no
dominant adnexal masses.

Other: There is no ascites or pneumoperitoneum. In the image 86 of
series 2 there is 2.6 cm focal inhomogeneous increased density in
the left labia majora. There is adjacent stranding in the fat
planes. This finding was not evident in the previous study done on
06/02/2021.

Musculoskeletal: Unremarkable.
IMPRESSION: There is 2.6 cm area of inhomogeneous increased density in the left
labia majora. Possibility of active inflammatory or infectious
process should be considered. Please correlate with clinical
physical examination findings.

There is no evidence of intestinal obstruction or pneumoperitoneum.
There is no hydronephrosis.

## 2022-07-27 ENCOUNTER — Inpatient Hospital Stay: Payer: Medicaid Other

## 2022-07-27 ENCOUNTER — Emergency Department: Payer: Medicaid Other

## 2022-07-27 ENCOUNTER — Inpatient Hospital Stay
Admission: EM | Admit: 2022-07-27 | Discharge: 2022-07-28 | DRG: 637 | Disposition: A | Discharge status: Home / Self Care | Payer: Medicaid Other | Attending: Internal Medicine | Admitting: Internal Medicine

## 2022-07-27 DIAGNOSIS — E0591 Thyrotoxicosis, unspecified with thyrotoxic crisis or storm: Secondary | ICD-10-CM | POA: Diagnosis present

## 2022-07-27 DIAGNOSIS — Z79899 Other long term (current) drug therapy: Secondary | ICD-10-CM | POA: Diagnosis not present

## 2022-07-27 DIAGNOSIS — R569 Unspecified convulsions: Secondary | ICD-10-CM | POA: Diagnosis present

## 2022-07-27 DIAGNOSIS — Z794 Long term (current) use of insulin: Secondary | ICD-10-CM

## 2022-07-27 DIAGNOSIS — G9341 Metabolic encephalopathy: Secondary | ICD-10-CM | POA: Diagnosis present

## 2022-07-27 DIAGNOSIS — R55 Syncope and collapse: Secondary | ICD-10-CM | POA: Diagnosis present

## 2022-07-27 DIAGNOSIS — Z8249 Family history of ischemic heart disease and other diseases of the circulatory system: Secondary | ICD-10-CM

## 2022-07-27 DIAGNOSIS — I5032 Chronic diastolic (congestive) heart failure: Secondary | ICD-10-CM | POA: Diagnosis present

## 2022-07-27 DIAGNOSIS — F32A Depression, unspecified: Secondary | ICD-10-CM | POA: Diagnosis present

## 2022-07-27 DIAGNOSIS — M545 Low back pain, unspecified: Secondary | ICD-10-CM | POA: Diagnosis present

## 2022-07-27 DIAGNOSIS — Z91018 Allergy to other foods: Secondary | ICD-10-CM | POA: Diagnosis not present

## 2022-07-27 DIAGNOSIS — G4700 Insomnia, unspecified: Secondary | ICD-10-CM | POA: Diagnosis present

## 2022-07-27 DIAGNOSIS — Z886 Allergy status to analgesic agent status: Secondary | ICD-10-CM

## 2022-07-27 DIAGNOSIS — E1010 Type 1 diabetes mellitus with ketoacidosis without coma: Secondary | ICD-10-CM | POA: Diagnosis present

## 2022-07-27 DIAGNOSIS — E039 Hypothyroidism, unspecified: Secondary | ICD-10-CM | POA: Diagnosis present

## 2022-07-27 DIAGNOSIS — Z1152 Encounter for screening for COVID-19: Secondary | ICD-10-CM

## 2022-07-27 DIAGNOSIS — F39 Unspecified mood [affective] disorder: Secondary | ICD-10-CM | POA: Diagnosis present

## 2022-07-27 DIAGNOSIS — G8929 Other chronic pain: Secondary | ICD-10-CM | POA: Diagnosis present

## 2022-07-27 DIAGNOSIS — E860 Dehydration: Secondary | ICD-10-CM | POA: Diagnosis present

## 2022-07-27 DIAGNOSIS — Z91148 Patient's other noncompliance with medication regimen for other reason: Secondary | ICD-10-CM | POA: Diagnosis not present

## 2022-07-27 DIAGNOSIS — F419 Anxiety disorder, unspecified: Secondary | ICD-10-CM | POA: Diagnosis present

## 2022-07-27 DIAGNOSIS — R739 Hyperglycemia, unspecified: Secondary | ICD-10-CM | POA: Diagnosis not present

## 2022-07-27 LAB — PHOSPHORUS: Phosphorus: 3.6 mg/dL (ref 2.5–4.6)

## 2022-07-27 LAB — BASIC METABOLIC PANEL WITH GFR
Anion gap: 14 (ref 5–15)
BUN: 11 mg/dL (ref 6–20)
CO2: 21 mmol/L — ABNORMAL LOW (ref 22–32)
Calcium: 9.4 mg/dL (ref 8.9–10.3)
Chloride: 95 mmol/L — ABNORMAL LOW (ref 98–111)
Creatinine, Ser: 0.44 mg/dL (ref 0.44–1.00)
GFR, Estimated: >60 mL/min
Glucose, Bld: 311 mg/dL — ABNORMAL HIGH (ref 70–99)
Potassium: 3.3 mmol/L — ABNORMAL LOW (ref 3.5–5.1)
Sodium: 130 mmol/L — ABNORMAL LOW (ref 135–145)

## 2022-07-27 LAB — BASIC METABOLIC PANEL
Anion gap: 8 (ref 5–15)
BUN: 9 mg/dL (ref 6–20)
CO2: 24 mmol/L (ref 22–32)
Calcium: 9.1 mg/dL (ref 8.9–10.3)
Chloride: 103 mmol/L (ref 98–111)
Creatinine, Ser: 0.43 mg/dL — ABNORMAL LOW (ref 0.44–1.00)
GFR, Estimated: >60 mL/min (ref >60)
Glucose, Bld: 238 mg/dL — ABNORMAL HIGH (ref 70–99)
Potassium: 3.6 mmol/L (ref 3.5–5.1)
Sodium: 135 mmol/L (ref 135–145)

## 2022-07-27 LAB — HIV ANTIBODY (ROUTINE TESTING W REFLEX): HIV Screen 4th Generation wRfx: NONREACTIVE

## 2022-07-27 LAB — CBG MONITORING, ED: Glucose-Capillary: 297 mg/dL — ABNORMAL HIGH (ref 70–99)

## 2022-07-27 LAB — GLUCOSE, CAPILLARY
Glucose-Capillary: 242 mg/dL — ABNORMAL HIGH (ref 70–99)
Glucose-Capillary: 252 mg/dL — ABNORMAL HIGH (ref 70–99)
Glucose-Capillary: 155 mg/dL — ABNORMAL HIGH (ref 70–99)

## 2022-07-27 LAB — TROPONIN I (HIGH SENSITIVITY): Troponin I (High Sensitivity): 6 ng/L (ref <18)

## 2022-07-27 LAB — CBC
HCT: 37.3 % (ref 36.0–46.0)
Hemoglobin: 12.6 g/dL (ref 12.0–15.0)
MCH: 24.6 pg — ABNORMAL LOW (ref 26.0–34.0)
MCHC: 33.8 g/dL (ref 30.0–36.0)
MCV: 72.9 fL — ABNORMAL LOW (ref 80.0–100.0)
Platelets: 487 10*3/uL — ABNORMAL HIGH (ref 150–400)
RBC: 5.12 MIL/uL — ABNORMAL HIGH (ref 3.87–5.11)
RDW: 14.1 % (ref 11.5–15.5)
WBC: 16.7 10*3/uL — ABNORMAL HIGH (ref 4.0–10.5)
nRBC: 0 % (ref 0.0–0.2)

## 2022-07-27 LAB — BLOOD GAS, VENOUS
Acid-Base Excess: 5.9 mmol/L — ABNORMAL HIGH (ref 0.0–2.0)
Bicarbonate: 28 mmol/L (ref 20.0–28.0)
O2 Saturation: 91.9 %
Patient temperature: 37
pCO2, Ven: 32 mmHg — ABNORMAL LOW (ref 44–60)
pH, Ven: 7.55 — ABNORMAL HIGH (ref 7.25–7.43)
pO2, Ven: 62 mmHg — ABNORMAL HIGH (ref 32–45)

## 2022-07-27 LAB — HEPATIC FUNCTION PANEL
ALT: 26 U/L (ref 0–44)
AST: 25 U/L (ref 15–41)
Albumin: 4.2 g/dL (ref 3.5–5.0)
Alkaline Phosphatase: 138 U/L — ABNORMAL HIGH (ref 38–126)
Bilirubin, Direct: 0.1 mg/dL (ref 0.0–0.2)
Indirect Bilirubin: 0.9 mg/dL (ref 0.3–0.9)
Total Bilirubin: 1 mg/dL (ref 0.3–1.2)
Total Protein: 8.2 g/dL — ABNORMAL HIGH (ref 6.5–8.1)

## 2022-07-27 LAB — HCG, QUANTITATIVE, PREGNANCY: hCG, Beta Chain, Quant, S: <1 m[IU]/mL (ref <5)

## 2022-07-27 LAB — MAGNESIUM: Magnesium: 1.8 mg/dL (ref 1.7–2.4)

## 2022-07-27 LAB — BETA-HYDROXYBUTYRIC ACID
Beta-Hydroxybutyric Acid: 0.37 mmol/L — ABNORMAL HIGH (ref 0.05–0.27)
Beta-Hydroxybutyric Acid: 0.43 mmol/L — ABNORMAL HIGH (ref 0.05–0.27)

## 2022-07-27 LAB — SARS CORONAVIRUS 2 BY RT PCR: SARS Coronavirus 2 by RT PCR: NEGATIVE

## 2022-07-27 LAB — T4, FREE: Free T4: 3.11 ng/dL — ABNORMAL HIGH (ref 0.61–1.12)

## 2022-07-27 LAB — MRSA NEXT GEN BY PCR, NASAL: MRSA by PCR Next Gen: NOT DETECTED

## 2022-07-27 LAB — LIPASE, BLOOD: Lipase: 24 U/L (ref 11–51)

## 2022-07-27 LAB — TSH: TSH: <0.01 u[IU]/mL — ABNORMAL LOW (ref 0.350–4.500)

## 2022-07-27 MED ORDER — PROPRANOLOL HCL 1 MG/ML IV SOLN: 1.0000 mg | Freq: Once | INTRAVENOUS | Status: DC | PRN

## 2022-07-27 MED ORDER — HYDROCORTISONE SOD SUC (PF) 100 MG IJ SOLR
100.0000 mg | Freq: Once | INTRAMUSCULAR | Status: AC
  Administered 2022-07-27: 100 mg via INTRAVENOUS
  Filled 2022-07-27: qty 2

## 2022-07-27 MED ORDER — PROPRANOLOL HCL 20 MG PO TABS
80.0000 mg | ORAL_TABLET | Freq: Three times a day (TID) | ORAL | Status: DC
  Administered 2022-07-27 – 2022-07-28 (×4): 80 mg via ORAL
  Filled 2022-07-27 (×4): qty 4

## 2022-07-27 MED ORDER — DOCUSATE SODIUM 100 MG PO CAPS: 100.0000 mg | ORAL_CAPSULE | Freq: Two times a day (BID) | ORAL | Status: DC | PRN

## 2022-07-27 MED ORDER — OXYCODONE HCL 5 MG PO TABS
5.0000 mg | ORAL_TABLET | Freq: Four times a day (QID) | ORAL | Status: DC | PRN
  Administered 2022-07-27 – 2022-07-28 (×4): 5 mg via ORAL
  Filled 2022-07-27 (×4): qty 1

## 2022-07-27 MED ORDER — POTASSIUM CHLORIDE CRYS ER 20 MEQ PO TBCR
40.0000 meq | EXTENDED_RELEASE_TABLET | Freq: Once | ORAL | Status: AC
  Administered 2022-07-27: 40 meq via ORAL
  Filled 2022-07-27: qty 2

## 2022-07-27 MED ORDER — METHIMAZOLE 10 MG PO TABS
40.0000 mg | ORAL_TABLET | Freq: Once | ORAL | Status: AC
  Administered 2022-07-27: 40 mg via ORAL
  Filled 2022-07-27: qty 4

## 2022-07-27 MED ORDER — ENOXAPARIN SODIUM 40 MG/0.4ML IJ SOSY: 0.5000 mg/kg | PREFILLED_SYRINGE | INTRAMUSCULAR | Status: DC

## 2022-07-27 MED ORDER — ENOXAPARIN SODIUM 40 MG/0.4ML IJ SOSY
0.5000 mg/kg | PREFILLED_SYRINGE | INTRAMUSCULAR | Status: DC
  Administered 2022-07-27: 42.5 mg via SUBCUTANEOUS
  Administered 2022-07-28: 40 mg via SUBCUTANEOUS
  Filled 2022-07-27 (×2): qty 0.8

## 2022-07-27 MED ORDER — INSULIN ASPART 100 UNIT/ML IJ SOLN: 0.0000 [IU] | Freq: Every day | INTRAMUSCULAR | Status: DC

## 2022-07-27 MED ORDER — DROPERIDOL 2.5 MG/ML IJ SOLN
2.5000 mg | Freq: Once | INTRAMUSCULAR | Status: AC
  Administered 2022-07-27: 2.5 mg via INTRAVENOUS
  Filled 2022-07-27: qty 2

## 2022-07-27 MED ORDER — MORPHINE SULFATE (PF) 2 MG/ML IV SOLN
1.0000 mg | INTRAVENOUS | Status: DC | PRN
  Administered 2022-07-27 – 2022-07-28 (×3): 2 mg via INTRAVENOUS
  Filled 2022-07-27 (×3): qty 1

## 2022-07-27 MED ORDER — INSULIN GLARGINE-YFGN 100 UNIT/ML ~~LOC~~ SOLN
10.0000 [IU] | Freq: Every day | SUBCUTANEOUS | Status: DC
  Administered 2022-07-27: 10 [IU] via SUBCUTANEOUS
  Filled 2022-07-27 (×2): qty 0.1

## 2022-07-27 MED ORDER — SODIUM CHLORIDE 0.9 % IV BOLUS
1000.0000 mL | Freq: Once | INTRAVENOUS | Status: AC
  Administered 2022-07-27: 1000 mL via INTRAVENOUS

## 2022-07-27 MED ORDER — POLYETHYLENE GLYCOL 3350 17 G PO PACK: 17.0000 g | PACK | Freq: Every day | ORAL | Status: DC | PRN

## 2022-07-27 MED ORDER — ONDANSETRON HCL 4 MG/2ML IJ SOLN
4.0000 mg | Freq: Once | INTRAMUSCULAR | Status: AC
  Administered 2022-07-27: 4 mg via INTRAVENOUS
  Filled 2022-07-27: qty 2

## 2022-07-27 MED ORDER — METHIMAZOLE 10 MG PO TABS
20.0000 mg | ORAL_TABLET | Freq: Two times a day (BID) | ORAL | Status: DC
  Administered 2022-07-28: 20 mg via ORAL
  Filled 2022-07-27: qty 2

## 2022-07-27 MED ORDER — INSULIN ASPART 100 UNIT/ML IJ SOLN
0.0000 [IU] | INTRAMUSCULAR | Status: DC
  Administered 2022-07-27: 8 [IU] via SUBCUTANEOUS
  Administered 2022-07-27: 5 [IU] via SUBCUTANEOUS
  Filled 2022-07-27: qty 1

## 2022-07-27 MED ORDER — IOHEXOL 300 MG/ML  SOLN
100.0000 mL | Freq: Once | INTRAMUSCULAR | Status: AC | PRN
  Administered 2022-07-27: 100 mL via INTRAVENOUS

## 2022-07-27 MED ORDER — MORPHINE SULFATE (PF) 4 MG/ML IV SOLN
4.0000 mg | Freq: Once | INTRAVENOUS | Status: AC
  Administered 2022-07-27: 4 mg via INTRAVENOUS
  Filled 2022-07-27: qty 1

## 2022-07-27 MED ORDER — IODINE STRONG (LUGOLS) 5 % PO SOLN
0.2000 mL | Freq: Once | ORAL | Status: DC
  Administered 2022-07-27: 0.2 mL via ORAL
  Filled 2022-07-27: qty 0.2

## 2022-07-27 MED ORDER — CHLORHEXIDINE GLUCONATE CLOTH 2 % EX PADS: 6.0000 | MEDICATED_PAD | Freq: Every day | CUTANEOUS | Status: DC

## 2022-07-27 MED ORDER — QUETIAPINE FUMARATE 100 MG PO TABS
300.0000 mg | ORAL_TABLET | Freq: Every day | ORAL | Status: DC
  Administered 2022-07-27: 300 mg via ORAL
  Filled 2022-07-27: qty 3

## 2022-07-27 MED ORDER — IODINE STRONG (LUGOLS) 5 % PO SOLN
0.2000 mL | Freq: Three times a day (TID) | ORAL | Status: DC
  Administered 2022-07-27 – 2022-07-28 (×3): 0.2 mL via ORAL
  Filled 2022-07-27 (×5): qty 0.2

## 2022-07-27 MED ORDER — DEXAMETHASONE SODIUM PHOSPHATE 4 MG/ML IJ SOLN
4.0000 mg | Freq: Three times a day (TID) | INTRAMUSCULAR | Status: DC
  Administered 2022-07-28: 4 mg via INTRAVENOUS
  Filled 2022-07-27: qty 1

## 2022-07-27 MED ORDER — QUETIAPINE FUMARATE 25 MG PO TABS
50.0000 mg | ORAL_TABLET | Freq: Every day | ORAL | Status: DC
  Administered 2022-07-28: 50 mg via ORAL
  Filled 2022-07-27: qty 2

## 2022-07-27 MED ORDER — ONDANSETRON HCL 4 MG/2ML IJ SOLN
4.0000 mg | Freq: Four times a day (QID) | INTRAMUSCULAR | Status: DC | PRN
  Administered 2022-07-27: 4 mg via INTRAVENOUS
  Filled 2022-07-27: qty 2

## 2022-07-27 MED ORDER — INSULIN ASPART 100 UNIT/ML IJ SOLN
0.0000 [IU] | Freq: Three times a day (TID) | INTRAMUSCULAR | Status: DC
  Administered 2022-07-28: 11 [IU] via SUBCUTANEOUS
  Administered 2022-07-28: 2 [IU] via SUBCUTANEOUS
  Filled 2022-07-27 (×3): qty 1

## 2022-07-27 MED ORDER — LAMOTRIGINE 25 MG PO TABS
100.0000 mg | ORAL_TABLET | Freq: Three times a day (TID) | ORAL | Status: DC
  Administered 2022-07-27 – 2022-07-28 (×5): 100 mg via ORAL
  Filled 2022-07-27 (×5): qty 4

## 2022-07-27 NOTE — ED Triage Notes (Addendum)
Pt arrives via EMS from home. Pt called EMS due to elevated BG. PT also endorses abdominal pain, nausea, and vomiting. Pt reports she also had a seizure and syncopal episode on Friday night. PT currently AxOx4.  BG 324  HR 130 O2 99 RR 16 BP 138/70

## 2022-07-27 NOTE — H&P (Signed)
NAME:  Nancy [name], MRN:  [id number], DOB:  07/18/2000, LOS: 0 ADMISSION DATE:  07/27/2022, CONSULTATION DATE: 07/27/2022 REFERRING MD: Dr. Modesto Charon, CHIEF COMPLAINT: Nausea/Vomiting    History of Present Illness:  This is a 22 yo female who presented to [facility] ER on 07/21 with nausea/vomiting/ poor po intake/abdominal pain/chest pain onset 3 days prior to presentation.  She also reports having presyncope/syncopal episodes several days ago.  She endorses having 3 seizures on 07/20, and seizure activity this morning.  She states she has been compliant with all outpatient medications, but she is concerned she has been vomiting them back up.  Although she reported to EDP she forgot to take her insulin this morning. Upon review of outpatient medications refilled at pharmacy it appears she has not refilled her diabetic medications since last yr.    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  EDP ordered prn ativan.  Vital signs were:  temp 98.1/sbp 102/hr 130's/O2 sats 100%on RA.  Significant lab results were: Na+ 130/K+ 3.3/chloride 95/CO2 21/glucose 311/alk phos 138/wbc 16.7/platelets 487/beta-hydroxy 0.37/TSH <0.010/free T4 3.11.  Pt has a hx of hyperthyroidism and saw her outpatient endocrinologist on 07/12 who recommended the following: increase methimazole from 20 mg daily to bid; continue propanolol 80 mg tid; and referral placed to thyroid surgeon.  In the ER pt received 2.5 mg iv droperidol/100 mg iv solucortef/0.2 mg of lugols 5% solution/40 mg po methimazole/4 mg of iv morphine/4 mg of iv zofran/1L NS bolus.  CT Abd/Pelvis and CXR negative.  PCCM team contacted for ICU admission.   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 rate 20, height 5\' 3"  (1.6 m), weight 83.9 kg, SpO2 99%.       No intake or output data in the 24 hours ending 07/27/22 1001 Filed Weights   07/27/22 0806  Weight: 83.9 kg    Examination: General: Acutely-ill appearing female, NAD on RA  HENT: No JVD Lungs: Clear throughout, even, non labored  Cardiovascular: Sinus tachycardia, s1s2, no m/r/g, 2+ radial/2+ distal pulses, no edema  Abdomen: +BS x4, soft, non tender, non distended  Extremities: Normal bulk and tone, moves all extremities Neuro: Awake, oriented, following commands, PERRLA, no neurological deficits  GU: Deferred   Resolved Hospital Problem list     Assessment & Plan:  #Seizure-like activity  #Acute metabolic encephalopathy  Pt has had multiple EEG's and continuous EEG monitoring findings were no evidence of seizures or epileptiform activity identified captured only psychogenic non-epileptic spells - Stat CT Head pending  - EEG pending  - Will check lamictal level and continue outpatient lamictal  - Seizure precautions  - Urine drug screen pending  - Continue supportive care   #HFpEF without acute exacerbation  - IV lasix as bp and renal function permits if signs of acute exacerbation   #Hyperthyroidism/thyroid storm  - Continuous  telemetry monitoring  - Continue home Methimazole 20 mg BID - Continue propanolol 80 mg tid  - Continue Lugol's iodine solution 0.4mL tid - Continue decadron 4mg  q8hrs  - Monitor hepatic function panel and CBC daily   #Mild DKA secondary to medication noncompliance  -  SSI and CBG's q4hrs for now; will repeat BMP and beta-hydroxybutyric acid if worsening will start insulin gtt  - Follow hypo/hyperglycemic protocol   #Nausea/vomiting  - Prn zofran   #Mood Disorder Hx Anxiety and Depression, Insomnia - Continue outpatient seroquel   Best Practice (right click and "Reselect all SmartList Selections" daily)   Diet/type: NPO DVT prophylaxis: LMWH GI prophylaxis: N/A Lines: N/A Foley:  N/A Code Status:  full code Last date of multidisciplinary goals of care discussion [N/A]  Updated pt regarding current plan of care and all questions were answered  Labs   CBC: Recent Labs  Lab 07/27/22 0809  WBC 16.7*  HGB 12.6  HCT 37.3  MCV 72.9*  PLT 487*    Basic Metabolic Panel: Recent Labs  Lab 07/27/22 0809  NA 130*  K 3.3*  CL 95*  CO2 21*  GLUCOSE 311*  BUN 11  CREATININE 0.44  CALCIUM 9.4  MG 1.8   GFR: Estimated Creatinine Clearance: 111.3 mL/min (by C-G formula based on SCr of 0.44 mg/dL). Recent Labs  Lab 07/27/22 0809  WBC 16.7*    Liver Function Tests: Recent Labs  Lab 07/27/22 0809  AST 25  ALT 26  ALKPHOS 138*  BILITOT 1.0  PROT 8.2*  ALBUMIN 4.2   Recent Labs  Lab 07/27/22 0846  LIPASE 24   No results for input(s): "AMMONIA" in the last 168 hours.  ABG    Component Value Date/Time   PHART 7.402 12/01/2018 0120   PCO2ART 34.2 12/01/2018 0120   PO2ART 84.0 12/01/2018 0120   HCO3 28.0 07/27/2022 0830   TCO2 29 04/10/2020 1105   ACIDBASEDEF 4.4 (H) 08/06/2019 1335   O2SAT 91.9 07/27/2022 0830     Coagulation Profile: No results for input(s): "INR", "PROTIME" in the last 168 hours.  Cardiac Enzymes: No results for input(s): "CKTOTAL", "CKMB", "CKMBINDEX", "TROPONINI" in the last 168 hours.  HbA1C: Hgb A1c MFr Bld  Date/Time Value Ref Range Status  01/01/2022 05:19 AM 6.5 (H) 4.8 - 5.6 % Final    Comment:    (NOTE)         Prediabetes: 5.7 - 6.4         Diabetes: >6.4         Glycemic control  for adults with diabetes: <7.0   10/28/2021 08:55 AM 6.3 (H) 4.8 - 5.6 % Final    Comment:    (NOTE) Pre diabetes:          5.7%-6.4%  Diabetes:              >6.4%  Glycemic control for   <7.0% adults with diabetes     CBG: Recent Labs  Lab 07/27/22 0757  GLUCAP 297*    Review of Systems: Positives in BOLD   Gen: Denies fever, chills, weight change, fatigue, night sweats HEENT: Denies blurred vision, double vision, hearing loss, tinnitus, sinus congestion, rhinorrhea, sore throat, neck stiffness, dysphagia PULM: Denies shortness of breath, cough, sputum production, hemoptysis, wheezing CV: chest pain, edema, orthopnea, paroxysmal nocturnal dyspnea, palpitations GI: abdominal pain, nausea, vomiting, diarrhea, hematochezia, melena, constipation, change in bowel habits GU: Denies dysuria, hematuria, polyuria, oliguria, urethral discharge Endocrine: Denies hot or cold intolerance, polyuria, polyphagia  or appetite change Derm: Denies rash, dry skin, scaling or peeling skin change Heme: Denies easy bruising, bleeding, bleeding gums Neuro: seizure activity, headache, numbness, weakness, slurred speech, loss of memory or consciousness   Past Medical History:  She,  has a past medical history of Acanthosis nigricans, Anxiety, CHF (congestive heart failure) (HCC), Chronic lower back pain, Depression, DKA, type 1 (HCC) (09/13/2018), Dyspepsia, Obesity, Ovarian cyst, Precocious adrenarche (HCC), Premature baby, Seizures (HCC), and Type II diabetes mellitus (HCC).   Surgical History:   Past Surgical History:  Procedure Laterality Date   ABDOMINAL HERNIA REPAIR     "I was a baby"   BIOPSY  10/12/2018   Procedure: BIOPSY;  Surgeon: Lynann Bologna, MD;  Location[facility] ENDOSCOPY;  Service: Endoscopy;;   BIOPSY  02/28/2020   Procedure: BIOPSY;  Surgeon: Shellia Cleverly, DO;  Location: [facility] ENDOSCOPY;  Service: Gastroenterology;;   ESOPHAGOGASTRODUODENOSCOPY (EGD) WITH PROPOFOL N/A 10/12/2018    Procedure: ESOPHAGOGASTRODUODENOSCOPY (EGD) WITH PROPOFOL;  Surgeon: Lynann Bologna, MD;  Location: [facility] ENDOSCOPY;  Service: Endoscopy;  Laterality: N/A;   ESOPHAGOGASTRODUODENOSCOPY (EGD) WITH PROPOFOL N/A 02/28/2020   Procedure: ESOPHAGOGASTRODUODENOSCOPY (EGD) WITH PROPOFOL;  Surgeon: Shellia Cleverly, DO;  Location: [facility] ENDOSCOPY;  Service: Gastroenterology;  Laterality: N/A;   FLEXIBLE SIGMOIDOSCOPY N/A 02/28/2020   Procedure: FLEXIBLE SIGMOIDOSCOPY;  Surgeon: Shellia Cleverly, DO;  Location: [facility] ENDOSCOPY;  Service: Gastroenterology;  Laterality: N/A;   HERNIA REPAIR     LEFT HEART CATH AND CORONARY ANGIOGRAPHY N/A 10/13/2018   Procedure: LEFT HEART CATH AND CORONARY ANGIOGRAPHY;  Surgeon: Kathleene Hazel, MD;  Location: [facility] INVASIVE CV LAB;  Service: Cardiovascular;  Laterality: N/A;   TONSILLECTOMY AND ADENOIDECTOMY     WISDOM TOOTH EXTRACTION  2017     Social History:   reports that she has never smoked. She has never used smokeless tobacco. She reports that she does not drink alcohol and does not use drugs.   Family History:  Her family history includes Allergic rhinitis in her mother; Asthma in her mother; Cervical cancer in her mother; Colon cancer in her maternal grandfather; Diabetes in her father, mother, paternal grandfather, and paternal grandmother; Eczema in her mother; Hyperlipidemia in her father; Hypertension in her father, maternal grandfather, mother, and paternal aunt; Obesity in her father, mother, paternal grandfather, and paternal grandmother. There is no history of Angioedema, Immunodeficiency, Urticaria, Stomach cancer, or Esophageal cancer.   Allergies Allergies  Allergen Reactions   Oatmeal Anaphylaxis   Tomato Anaphylaxis   Acetaminophen Other (See Comments)    Avoids due to liver   Ibuprofen Other (See Comments)    GI MD said to not take this anymore     Home Medications  Prior to Admission medications   Medication Sig Start Date End Date Taking?  Authorizing Provider  furosemide (LASIX) 20 MG tablet Take 20 mg by mouth daily as needed for fluid or edema.    [provider]  Glucagon (BAQSIMI TWO PACK) 3 MG/DOSE POWD Place 1 spray into the nose as needed. 08/23/21   Wouk, Wilfred Curtis, MD  insulin glargine (LANTUS) 100 unit/mL SOPN Inject 20 Units into the skin 2 (two) times daily. Patient taking differently: Inject 60 Units into the skin 2 (two) times daily. 08/23/21   Wouk, Wilfred Curtis, MD  lamoTRIgine (LAMICTAL) 100 MG tablet Take 1 tablet (100 mg total) by mouth in the morning, at noon, and at bedtime. 10/05/21   Darlin Priestly, MD  loperamide (IMODIUM) 2 MG capsule Take 2 capsules (4  mg total) by mouth as needed for diarrhea or loose stools. 08/23/21   Wouk, Wilfred Curtis, MD  losartan (COZAAR) 25 MG tablet Take 1 tablet (25 mg total) by mouth daily. 08/23/21   Wouk, Wilfred Curtis, MD  lubiprostone (AMITIZA) 8 MCG capsule Take 8 mcg by mouth 2 (two) times daily with a meal.    [provider]  methimazole (TAPAZOLE) 10 MG tablet Take 2 tablets (20 mg total) by mouth 2 (two) times daily. 10/05/21   Darlin Priestly, MD  metoCLOPramide (REGLAN) 10 MG tablet Take 1 tablet (10 mg total) by mouth 3 (three) times daily with meals. 05/07/21 05/07/22  Loleta [name], MD  ondansetron (ZOFRAN-ODT) 4 MG disintegrating tablet Take 1 tablet (4 mg total) by mouth every 8 (eight) hours as needed. 01/01/22   Acheampong, Genice Rouge, MD  propranolol (INDERAL) 80 MG tablet Take 1 tablet (80 mg total) by mouth 3 (three) times daily. 10/05/21   Darlin Priestly, MD  QUEtiapine (SEROQUEL) 100 MG tablet Take 300 mg by mouth at bedtime.    [provider]  QUEtiapine (SEROQUEL) 50 MG tablet Take 1 tablet (50 mg total) by mouth daily. 01/27/22   Lurene Shadow, MD  prochlorperazine (COMPAZINE) 25 MG suppository PLACE 1 SUPPOSITORY (25 MG TOTAL) RECTALLY EVERY TWELVE HOURS AS NEEDED FOR NAUSEA OR VOMITING. 02/29/20 02/29/20  Maretta Bees, MD     Critical care time:  60 minutes    Zada Girt, AGNP  Pulmonary/Critical Care Pager [phone] (please enter 7 digits) PCCM Consult Pager [phone] (please enter 7 digits)

## 2022-07-27 NOTE — ED Notes (Signed)
Pt seizing, EDP called to bedside.

## 2022-07-27 NOTE — ED Notes (Signed)
Pt continues to have seizure-like activity. Shaking whole body. Pt does not have tongue injury or incontinence. Seizure pads are on bed. Dr Modesto Charon has been made aware.

## 2022-07-27 NOTE — ED Provider Notes (Addendum)
[facility] Provider Note    Event Date/Time   First MD Initiated Contact with Patient 07/27/22 [phone]     (approximate)   History   Hyperglycemia and Loss of Consciousness   HPI  Nancy [name] is a 22 y.o. female   Past medical history of type II diabetic on insulin, iron deficiency anemia, chronic diastolic CHF, seizure disorder on Lamictal, depression and anxiety, polysubstance use, hypothyroid who presents to the emergency department because she feels unwell.  She states over the last 3 days she has felt nauseated, vomiting, poor p.o. intake, abdominal and chest pain, presyncope and syncopal episodes over the last several days.  She has been taking her medications but does not know whether she has been vomiting them back up.  She denies shortness of breath, cough, fever.  She denies dysuria or frequency. She denies drug or alcohol use.  She has been taking her insulin but forgot this morning only.   External Medical Documents Reviewed: Discharge summary from 01/27/2022 when she was admitted to the ICU for hyperthyroidism with storm, noted at that time to have been seen by neurologist for reported seizures with CT head and EEG unremarkable, neurology suspect that the seizure were psychogenic nonepileptic seizures and no changes were made to her home seizure medications.      Physical Exam   Triage Vital Signs: ED Triage Vitals  Encounter Vitals Group     BP 07/27/22 0806 (!) 165/103     Systolic BP Percentile --      Diastolic BP Percentile --      Pulse Rate 07/27/22 0804 (!) 138     Resp 07/27/22 0804 (!) 23     Temp 07/27/22 0804 98.1 F (36.7 C)     Temp src --      SpO2 07/27/22 0806 100 %     Weight 07/27/22 0806 185 lb (83.9 kg)     Height 07/27/22 0806 5\' 3"  (1.6 m)     Head Circumference --      Peak Flow --      Pain Score 07/27/22 0806 9     Pain Loc --      Pain Education --      Exclude from Growth Chart --     Most recent  vital signs: Vitals:   07/27/22 0830 07/27/22 0845  BP: 102/71   Pulse: (!) 130 (!) 123  Resp: (!) 26 (!) 34  Temp:    SpO2: 100% 99%    General: Awake, no distress.  CV:  Good peripheral perfusion.  Resp:  Normal effort.  Abd:  No distention.  Other:  She appears dehydrated with dry mucous membranes.  She is tachycardic.  Her respirations at 34.  She is borderline low blood pressure 100/70, afebrile.  She has a soft abdomen but complains of pain when I press.  She has clear lungs without focality or wheezing or rales.  She makes several whole body jerking motions lasting seconds at a time clenching her eyes and has pelvic thrusting, no postictal.  No tongue bite no incontinence.   ED Results / Procedures / Treatments   Labs (all labs ordered are listed, but only abnormal results are displayed) Labs Reviewed  BASIC METABOLIC PANEL - Abnormal; Notable for the following components:      Result Value   Sodium 130 (*)    Potassium 3.3 (*)    Chloride 95 (*)    CO2 21 (*)  Glucose, Bld 311 (*)    All other components within normal limits  CBC - Abnormal; Notable for the following components:   WBC 16.7 (*)    RBC 5.12 (*)    MCV 72.9 (*)    MCH 24.6 (*)    Platelets 487 (*)    All other components within normal limits  HEPATIC FUNCTION PANEL - Abnormal; Notable for the following components:   Total Protein 8.2 (*)    Alkaline Phosphatase 138 (*)    All other components within normal limits  BLOOD GAS, VENOUS - Abnormal; Notable for the following components:   pH, Ven 7.55 (*)    pCO2, Ven 32 (*)    pO2, Ven 62 (*)    Acid-Base Excess 5.9 (*)    All other components within normal limits  BETA-HYDROXYBUTYRIC ACID - Abnormal; Notable for the following components:   Beta-Hydroxybutyric Acid 0.37 (*)    All other components within normal limits  TSH - Abnormal; Notable for the following components:   TSH <0.010 (*)    All other components within normal limits  T4, FREE -  Abnormal; Notable for the following components:   Free T4 3.11 (*)    All other components within normal limits  CBG MONITORING, ED - Abnormal; Notable for the following components:   Glucose-Capillary 297 (*)    All other components within normal limits  MAGNESIUM  URINALYSIS, ROUTINE W REFLEX MICROSCOPIC  LAMOTRIGINE LEVEL  HCG, QUANTITATIVE, PREGNANCY  LIPASE, BLOOD  CBG MONITORING, ED  CBG MONITORING, ED  POC URINE PREG, ED  TROPONIN I (HIGH SENSITIVITY)     I ordered and reviewed the above labs they are notable for she is hyperglycemic in the 200s, has a white blood cell count of 16, and has a beta hydroxybutyrate which is 0.37  EKG  ED ECG REPORT I, Pilar Jarvis, the attending physician, personally viewed and interpreted this ECG.   Date: 07/27/2022  EKG Time: 0801  Rate: 135  Rhythm: sinus tachycardia  Axis: nl  Intervals:none  ST&T Change: no stemi     PROCEDURES:  Critical Care performed: Yes, see critical care procedure note(s)  .Critical Care  Performed by: Pilar Jarvis, MD Authorized by: Pilar Jarvis, MD   Critical care provider statement:    Critical care time (minutes):  30   Critical care was time spent personally by me on the following activities:  Development of treatment plan with patient or surrogate, discussions with consultants, evaluation of patient's response to treatment, examination of patient, ordering and review of laboratory studies, ordering and review of radiographic studies, ordering and performing treatments and interventions, pulse oximetry, re-evaluation of patient's condition and review of old charts    MEDICATIONS ORDERED IN ED: Medications  methimazole (TAPAZOLE) tablet 40 mg (has no administration in time range)  hydrocortisone sodium succinate (SOLU-CORTEF) 100 MG injection 100 mg (has no administration in time range)  Iodine Strong (Lugols) 5 % solution 0.2 mL (has no administration in time range)  propranolol (INDERAL)  injection 1 mg (has no administration in time range)  sodium chloride 0.9 % bolus 1,000 mL (1,000 mLs Intravenous New Bag/Given 07/27/22 0854)  ondansetron (ZOFRAN) injection 4 mg (4 mg Intravenous Given 07/27/22 0854)  morphine (PF) 4 MG/ML injection 4 mg (4 mg Intravenous Given 07/27/22 0854)    External physician / consultants:  I spoke with ICU team regarding care plan for this patient.   IMPRESSION / MDM / ASSESSMENT AND PLAN / ED COURSE  I reviewed the triage vital signs and the nursing notes.                                Patient's presentation is most consistent with acute presentation with potential threat to life or bodily function.  Differential diagnosis includes, but is not limited to, DKA, dehydration, electrolyte disturbance, seizures, psychogenic nonepileptic seizures, intra-abdominal infection or obstruction, urinary tract infection, pregnancy related, thyroid dysfunction/hyperthyroid   The patient is on the cardiac monitor to evaluate for evidence of arrhythmia and/or significant heart rate changes.  MDM:    Patient with multiple medical comorbidities who presents with nausea vomiting chest pain and abdominal pain for the last several days.  Elevated blood sugar check remainder of labs for evidence of DKA electrolyte disturbances or AKI.  Appears dehydrated will give IV crystalloid bolus, IV antiemetic.    She has pain in her chest and abdomen and has tenderness to abdominal palpation, check CT abdomen pelvis with IV contrast for surgical pathologies like appendicitis or obstruction.  Give IV morphine.  Check troponin for acs  She is tachycardic may be in the setting of DKA, dehydration.  But also a history of thyroid storm in January which required ICU admission, check thyroid levels.  History of psychogenic nonepileptic seizures with episodes consistent with these as well.  She appears anxious.  These episodes have been frequent and hindering ability to obtain labs at  times, have ordered as needed Ativan IV for anxiety.  --- This is a patient with elevated T4 with concern for thyrotoxicosis with a history of the same requiring ICU admission.  She remains tachycardic 130s, has increased in her psychogenic nonepileptic seizure activity, will treat thyrotoxicosis with IV steroid, methimazole, iodine 60 minutes after, and if tachycardia refractory to IV fluid administration (appears dehydrated with nausea and vomiting over the weekend) then will give propranolol.  When I spoke with her she seemed a awake alert and oriented and answering all questions appropriately, but nursing states during orientation questions she seemed confused about time and situation.  Given symptomatology of confusion, anxiety, tachycardia, n/v, concern for thyroid storm; scores +40 on Burch Wartofsky Thyrotoxicosis Scale ("may have impending thyroid storm") --- ICU consulted.        FINAL CLINICAL IMPRESSION(S) / ED DIAGNOSES   Final diagnoses:  Dehydration  Hyperglycemia  Seizure-like activity (HCC)  Thyrotoxicosis with thyrotoxic crisis, unspecified thyrotoxicosis type     Rx / DC Orders   ED Discharge Orders     None        Note:  This document was prepared using Dragon voice recognition software and may include unintentional dictation errors.    Pilar Jarvis, MD 07/27/22 8295    Pilar Jarvis, MD 07/27/22 [phone]

## 2022-07-27 NOTE — ED Notes (Signed)
Pt seizing, EDP at bedside.

## 2022-07-27 NOTE — ED Notes (Signed)
Pt lying on side with eyes closed. Respirations unlabored.

## 2022-07-28 ENCOUNTER — Ambulatory Visit: Payer: Medicaid Other

## 2022-07-28 DIAGNOSIS — E860 Dehydration: Secondary | ICD-10-CM

## 2022-07-28 DIAGNOSIS — E0591 Thyrotoxicosis, unspecified with thyrotoxic crisis or storm: Secondary | ICD-10-CM

## 2022-07-28 DIAGNOSIS — R569 Unspecified convulsions: Secondary | ICD-10-CM

## 2022-07-28 DIAGNOSIS — R739 Hyperglycemia, unspecified: Secondary | ICD-10-CM | POA: Diagnosis not present

## 2022-07-28 LAB — CBC
HCT: 34.6 % — ABNORMAL LOW (ref 36.0–46.0)
Hemoglobin: 11.5 g/dL — ABNORMAL LOW (ref 12.0–15.0)
MCH: 24.6 pg — ABNORMAL LOW (ref 26.0–34.0)
MCHC: 33.2 g/dL (ref 30.0–36.0)
MCV: 73.9 fL — ABNORMAL LOW (ref 80.0–100.0)
Platelets: 406 10*3/uL — ABNORMAL HIGH (ref 150–400)
RBC: 4.68 MIL/uL (ref 3.87–5.11)
RDW: 14.7 % (ref 11.5–15.5)
WBC: 15.3 10*3/uL — ABNORMAL HIGH (ref 4.0–10.5)
nRBC: 0 % (ref 0.0–0.2)

## 2022-07-28 LAB — GLUCOSE, CAPILLARY
Glucose-Capillary: 144 mg/dL — ABNORMAL HIGH (ref 70–99)
Glucose-Capillary: 316 mg/dL — ABNORMAL HIGH (ref 70–99)
Glucose-Capillary: 208 mg/dL — ABNORMAL HIGH (ref 70–99)

## 2022-07-28 LAB — BASIC METABOLIC PANEL
Anion gap: 7 (ref 5–15)
BUN: 14 mg/dL (ref 6–20)
CO2: 26 mmol/L (ref 22–32)
Calcium: 9.2 mg/dL (ref 8.9–10.3)
Chloride: 104 mmol/L (ref 98–111)
Creatinine, Ser: 0.42 mg/dL — ABNORMAL LOW (ref 0.44–1.00)
GFR, Estimated: >60 mL/min (ref >60)
Glucose, Bld: 154 mg/dL — ABNORMAL HIGH (ref 70–99)
Potassium: 3.7 mmol/L (ref 3.5–5.1)
Sodium: 137 mmol/L (ref 135–145)

## 2022-07-28 LAB — HEMOGLOBIN A1C
Hgb A1c MFr Bld: 6.1 % — ABNORMAL HIGH (ref 4.8–5.6)
Mean Plasma Glucose: 128 mg/dL

## 2022-07-28 LAB — PHOSPHORUS: Phosphorus: 3.9 mg/dL (ref 2.5–4.6)

## 2022-07-28 LAB — LAMOTRIGINE LEVEL: Lamotrigine Lvl: <1 ug/mL — ABNORMAL LOW (ref 2.0–20.0)

## 2022-07-28 LAB — MAGNESIUM: Magnesium: 2 mg/dL (ref 1.7–2.4)

## 2022-07-28 MED ORDER — LOSARTAN POTASSIUM 50 MG PO TABS
25.0000 mg | ORAL_TABLET | Freq: Every day | ORAL | Status: DC
  Administered 2022-07-28: 25 mg via ORAL
  Filled 2022-07-28: qty 1

## 2022-07-28 MED ORDER — INSULIN GLARGINE-YFGN 100 UNIT/ML ~~LOC~~ SOLN
10.0000 [IU] | Freq: Two times a day (BID) | SUBCUTANEOUS | Status: DC
  Filled 2022-07-28: qty 0.1

## 2022-07-28 MED ORDER — INSULIN ASPART 100 UNIT/ML IJ SOLN
5.0000 [IU] | Freq: Three times a day (TID) | INTRAMUSCULAR | Status: DC
  Administered 2022-07-28: 4 [IU] via SUBCUTANEOUS

## 2022-07-28 MED ORDER — ESOMEPRAZOLE MAGNESIUM 20 MG PO CPDR: 20.0000 mg | DELAYED_RELEASE_CAPSULE | Freq: Every day | ORAL | 1 refills | Status: DC

## 2022-07-28 MED ORDER — HYOSCYAMINE SULFATE 0.125 MG SL SUBL
0.2500 mg | SUBLINGUAL_TABLET | Freq: Once | SUBLINGUAL | Status: AC
  Administered 2022-07-28: 0.25 mg via SUBLINGUAL
  Filled 2022-07-28: qty 2

## 2022-07-28 NOTE — Discharge Summary (Signed)
Physician Discharge Summary   Patient: Nancy [name] MRN: [id number] DOB: Feb 21, 2000  Admit date:     07/27/2022  Discharge date: 07/28/22  Discharge Physician: Arnetha Courser   PCP: Lucienne Minks, FNP   Recommendations at discharge:  Please obtain CBC, BMP and TSH during next follow-up visit. Please encourage compliance with her medications, per patient she is compliant but has not filled out his insulin for the past 1 year. Follow-up with primary care provider Follow-up with endocrinology.  Discharge Diagnoses: Principal Problem:   Seizure [facility])   Hospital Course: Taken from H&P.  Nancy [name] is a 22 y.o. 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 intake/abdominal pain/chest pain for the past 3 days.  Also reported presyncope/syncopal episode several days ago.  Apparently had 3 seizures on 7/20 and a seizure-like activity before presenting to the hospital. Per patient she has been compliant with all medications, upon review of her outpatient medications refilled at pharmacy it appears that she has not refilled her diabetic medications since last year.  ED course.  Hemodynamically stable.  Had a seizure-like activity in ED.  Labs pertinent for sodium 130, potassium 3.3, CO2 21, glucose 311, alkaline phosphatase 138, leukocytosis at 16.7, platelet 487, beta hydroxy 0.37, TSH less than 0.010 and free T4 3.11. CT abdomen and pelvis was negative.  Chest x-ray normal.  CT head unremarkable. Initially admitted in ICU.  7/22: Blood pressure mildly elevated 150/86-restarting home Cozaar.  Improving leukocytosis. No further seizure-like activity noted.  Patient has an history of psychogenic seizures, multiple prior EEGs were negative.  Patient now  appears to be at baseline.  Per patient she is compliant with her medications, asking for more pain medication stating that she follow-up in the pain clinic.  No record found.  Patient was asked to contact primary care doctor or physician managing her pain for more pain medications as we will not be able to prescribe any more.  Apparently her methimazole dose was recently increased by her endocrinologist.  Per patient she takes according to the new recommendations.  Patient should continue on current medications and need to have a close follow-up with her providers for further recommendations.   Consultants: PCCM. Procedures performed: None  Disposition: Home Diet recommendation:  Discharge Diet Orders (From admission, onward)     Start     Ordered   07/28/22 0000  Diet - low sodium heart healthy        07/28/22 1349           Cardiac and Carb modified diet DISCHARGE MEDICATION: Allergies as of 07/28/2022       Reactions   Oatmeal Anaphylaxis   Tomato Anaphylaxis   Acetaminophen Other (See Comments)   Avoids due to liver   Ibuprofen Other (See Comments)   GI MD said to not take this anymore        Medication List     STOP taking these medications    metoCLOPramide 10 MG tablet Commonly known as: REGLAN       TAKE these medications    Baqsimi Two Pack 3 MG/DOSE Powd Generic drug: Glucagon Place 1 spray into the nose as needed.   esomeprazole 20 MG capsule Commonly known as: NexIUM Take 1 capsule (20 mg total) by mouth daily.   furosemide 20 MG tablet Commonly  known as: LASIX Take 20 mg by mouth daily as needed for fluid or edema.   insulin glargine 100 unit/mL Sopn Commonly known as: LANTUS Inject 20 Units into the skin 2 (two) times daily. What changed: how much to take   lamoTRIgine 100 MG tablet Commonly known as: LAMICTAL Take 1 tablet (100 mg total) by mouth in the morning, at noon, and at bedtime.   loperamide 2 MG capsule Commonly known as:  IMODIUM Take 2 capsules (4 mg total) by mouth as needed for diarrhea or loose stools.   losartan 25 MG tablet Commonly known as: COZAAR Take 1 tablet (25 mg total) by mouth daily.   lubiprostone 8 MCG capsule Commonly known as: AMITIZA Take 8 mcg by mouth 2 (two) times daily with a meal.   methimazole 10 MG tablet Commonly known as: TAPAZOLE Take 2 tablets (20 mg total) by mouth 2 (two) times daily.   ondansetron 4 MG disintegrating tablet Commonly known as: ZOFRAN-ODT Take 1 tablet (4 mg total) by mouth every 8 (eight) hours as needed.   propranolol 80 MG tablet Commonly known as: INDERAL Take 1 tablet (80 mg total) by mouth 3 (three) times daily.   QUEtiapine 100 MG tablet Commonly known as: SEROQUEL Take 300 mg by mouth at bedtime.   QUEtiapine 50 MG tablet Commonly known as: SEROQUEL Take 1 tablet (50 mg total) by mouth daily.        Discharge Exam: Filed Weights   07/27/22 0806 07/27/22 1115  Weight: 83.9 kg 82.8 kg   General.  Well-developed lady, in no acute distress. Pulmonary.  Lungs clear bilaterally, normal respiratory effort. CV.  Regular rate and rhythm, no JVD, rub or murmur. Abdomen.  Soft, nontender, nondistended, BS positive. CNS.  Alert and oriented .  No focal neurologic deficit. Extremities.  No edema, no cyanosis, pulses intact and symmetrical. Psychiatry.  Judgment and insight appears normal.   Condition at discharge: stable  The results of significant diagnostics from this hospitalization (including imaging, microbiology, ancillary and laboratory) are listed below for reference.   Imaging Studies: CT HEAD WO CONTRAST ( )  Result Date: 07/27/2022 CLINICAL DATA:  Mental status change, unknown cause. Seizure activity. EXAM: CT HEAD WITHOUT CONTRAST TECHNIQUE: Contiguous axial images were obtained from the base of the skull through the vertex without intravenous contrast. RADIATION DOSE REDUCTION: This exam was performed according to the  departmental dose-optimization program which includes automated exposure control, adjustment of the mA and/or kV according to patient size and/or use of iterative reconstruction technique. COMPARISON:  Head CT 01/25/2022 and MRI 02/19/2021 FINDINGS: Brain: There is no evidence of an acute infarct, intracranial hemorrhage, mass, midline shift, or extra-axial fluid collection. The ventricles and sulci are within normal limits. Vascular: No hyperdense vessel. Skull: No acute fracture or suspicious osseous lesion. Sinuses/Orbits: Mild mucosal thickening in the ethmoid and sphenoid sinuses bilaterally. Trace left mastoid fluid. Unremarkable included orbits. Other: None. IMPRESSION: Unremarkable CT appearance of the brain. Electronically Signed   By: Sebastian Ache M.D.   On: 07/27/2022 10:59   CT ABDOMEN PELVIS W CONTRAST  Result Date: 07/27/2022 CLINICAL DATA:  Abdominal pain, acute, nonlocalized. Nausea and vomiting. EXAM: CT ABDOMEN AND PELVIS WITH CONTRAST TECHNIQUE: Multidetector CT imaging of the abdomen and pelvis was performed using the standard protocol following bolus administration of intravenous contrast. RADIATION DOSE REDUCTION: This exam was performed according to the departmental dose-optimization program which includes automated exposure control, adjustment of the mA and/or kV according to patient size and/or  use of iterative reconstruction technique. CONTRAST:  OMNIPAQUE IOHEXOL 300 MG/ML  SOLN COMPARISON:  CT abdomen and pelvis 10/03/2021 FINDINGS: Lower chest: Clear lung bases. Hepatobiliary: No focal liver abnormality is seen. No gallstones, gallbladder wall thickening, or biliary dilatation. Pancreas: Unremarkable. Spleen: Unremarkable. Adrenals/Urinary Tract: Unremarkable right adrenal gland. 13 mm left adrenal nodule is unchanged from at least 08/26/2019, most consistent with a benign adenoma for which no follow-up imaging is recommended. No evidence of a renal mass, calculi, or  hydronephrosis. Unremarkable bladder. Stomach/Bowel: The stomach is unremarkable. There is no evidence of bowel obstruction or inflammation. The appendix is unremarkable. Vascular/Lymphatic: Normal caliber of the abdominal aorta. No enlarged lymph nodes. Reproductive: Unremarkable uterus and adnexa. 1.8 cm right-sided Bartholin's cyst. Other: No ascites or pneumoperitoneum. Musculoskeletal: No acute osseous abnormality or suspicious osseous lesion. IMPRESSION: No acute abnormality identified in the abdomen or pelvis. Electronically Signed   By: Sebastian Ache M.D.   On: 07/27/2022 10:16   DG Chest Port 1 View  Result Date: 07/27/2022 CLINICAL DATA:  Chest pain. EXAM: PORTABLE CHEST 1 VIEW COMPARISON:  01/25/2022 FINDINGS: The lungs are clear without focal pneumonia, edema, pneumothorax or pleural effusion. The cardiopericardial silhouette is within normal limits for size. No acute bony abnormality. Telemetry leads overlie the chest. IMPRESSION: No active disease. Electronically Signed   By: Kennith Center M.D.   On: 07/27/2022 09:36    Microbiology: Results for orders placed or performed during the hospital encounter of 07/27/22  MRSA Next Gen by PCR, Nasal     Status: None   Collection Time: 07/27/22 11:11 AM   Specimen: Nasal Mucosa; Nasal Swab  Result Value Ref Range Status   MRSA by PCR Next Gen NOT DETECTED NOT DETECTED Final    Comment: (NOTE) The GeneXpert MRSA Assay (FDA approved for NASAL specimens only), is one component of a comprehensive MRSA colonization surveillance program. It is not intended to diagnose MRSA infection nor to guide or monitor treatment for MRSA infections. Test performance is not FDA approved in patients less than 24 years old. Performed at [facility], [address] [place], [place] [address]   SARS Coronavirus 2 by RT PCR (hospital order, performed in [facility] hospital lab) *cepheid single result test* Anterior Nasal Swab     Status: None    Collection Time: 07/27/22 11:35 AM   Specimen: Anterior Nasal Swab  Result Value Ref Range Status   SARS Coronavirus 2 by RT PCR NEGATIVE NEGATIVE Final    Comment: (NOTE) SARS-CoV-2 target nucleic acids are NOT DETECTED.  The SARS-CoV-2 RNA is generally detectable in upper and lower respiratory specimens during the acute phase of infection. The lowest concentration of SARS-CoV-2 viral copies this assay can detect is 250 copies / mL. A negative result does not preclude SARS-CoV-2 infection and should not be used as the sole basis for treatment or other patient management decisions.  A negative result may occur with improper specimen collection / handling, submission of specimen other than nasopharyngeal swab, presence of viral mutation(s) within the areas targeted by this assay, and inadequate number of viral copies (<250 copies / mL). A negative result must be combined with clinical observations, patient history, and epidemiological information.  Fact Sheet for Patients:   RoadLapTop.co.za  Fact Sheet for Healthcare Providers: http://kim-miller.com/  This test is not yet approved or  cleared by the [place] FDA and has been authorized for detection and/or diagnosis of SARS-CoV-2 by FDA under an Emergency Use Authorization (EUA).  This EUA will remain in effect (meaning this test can be used) for the duration of the COVID-19 declaration under Section 564(b)(1) of the Act, 21 U.S.C. section 360bbb-3(b)(1), unless the authorization is terminated or revoked sooner.  Performed at [facility], [address] [place], [place] [address]     Labs: CBC: Recent Labs  Lab 07/27/22 0809 07/28/22 0448  WBC 16.7* 15.3*  HGB 12.6 11.5*  HCT 37.3 34.6*  MCV 72.9* 73.9*  PLT 487* 406*   Basic Metabolic Panel: Recent Labs  Lab 07/27/22 0809 07/27/22 1225 07/28/22 0448  NA 130* 135 137  K 3.3* 3.6 3.7  CL 95* 103 104   CO2 21* 24 26  GLUCOSE 311* 238* 154*  BUN 11 9 14   CREATININE 0.44 0.43* 0.42*  CALCIUM 9.4 9.1 9.2  MG 1.8  --  2.0  PHOS  --  3.6 3.9   Liver Function Tests: Recent Labs  Lab 07/27/22 0809  AST 25  ALT 26  ALKPHOS 138*  BILITOT 1.0  PROT 8.2*  ALBUMIN 4.2   CBG: Recent Labs  Lab 07/27/22 1527 07/27/22 2119 07/28/22 0739 07/28/22 1123 07/28/22 1330  GLUCAP 252* 155* 144* 316* 208*    Discharge time spent: greater than 30 minutes.  This record has been created using Conservation officer, historic buildings. Errors have been sought and corrected,but may not always be located. Such creation errors do not reflect on the standard of care.   Signed: Arnetha Courser, MD [facility] 07/28/2022

## 2022-07-28 NOTE — Progress Notes (Signed)
Inpatient Diabetes Program Recommendations  AACE/ADA: New Consensus Statement on Inpatient Glycemic Control (2015)  Target Ranges:  Prepandial:   less than 140 mg/dL      Peak postprandial:   less than 180 mg/dL (1-2 hours)      Critically ill patients:  140 - 180 mg/dL   Lab Results  Component Value Date   GLUCAP 208 (H) 07/28/2022   HGBA1C 6.1 (H) 07/27/2022     Diabetes history:  DM 2 Outpatient Diabetes medications:  Lantus 20 units bid  Inpatient Diabetes Program Recommendations:    Spoke with patient and she states that she is taking Lantus as ordered by her endocrinologist.  She however does not take rapid acting insulin due to "her insurance does not cover". Encouraged close follow up with her PCP and endocrinologist.  A1C =6.1%/  To discharge today.   Thanks,  Beryl Meager, RN, BC-ADM Inpatient Diabetes Coordinator Pager [phone]  (8a-5p)

## 2022-07-28 NOTE — Hospital Course (Addendum)
Taken from H&P.  Nancy [name] is a 22 y.o. 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 intake/abdominal pain/chest pain for the past 3 days.  Also reported presyncope/syncopal episode several days ago.  Apparently had 3 seizures on 7/20 and a seizure-like activity before presenting to the hospital. Per patient she has been compliant with all medications, upon review of her outpatient medications refilled at pharmacy it appears that she has not refilled her diabetic medications since last year.  ED course.  Hemodynamically stable.  Had a seizure-like activity in ED.  Labs pertinent for sodium 130, potassium 3.3, CO2 21, glucose 311, alkaline phosphatase 138, leukocytosis at 16.7, platelet 487, beta hydroxy 0.37, TSH less than 0.010 and free T4 3.11. CT abdomen and pelvis was negative.  Chest x-ray normal.  CT head unremarkable. Initially admitted in ICU.  7/22: Blood pressure mildly elevated 150/86-restarting home Cozaar.  Improving leukocytosis. No further seizure-like activity noted.  Patient has an history of psychogenic seizures, multiple prior EEGs were negative.  Patient now appears to be at baseline.  Per patient she is compliant with her medications, asking for more pain medication stating that she follow-up in the pain clinic.  No record found.  Patient was asked to contact primary care doctor or physician managing her pain for more pain medications as we will not be able to prescribe any more.  Apparently her methimazole dose was recently increased by her endocrinologist.  Per patient she takes according to the new recommendations.  Patient should continue on current medications and need to have a close follow-up with her providers for further  recommendations.

## 2022-07-28 NOTE — TOC Initial Note (Signed)
Transition of Care [facility]) - Initial/Assessment Note    Patient Details  Name: Nancy [name] MRN: [id number] Date of Birth: March 26, 2000  Transition of Care [facility]) CM/SW Contact:    Kreg Shropshire, RN Phone Number: 07/28/2022, 12:53 PM  Clinical Narrative:                  Cm assessed for TOC needs. Pt stated that she lives with mother 2 weeks out of the year in [place]. She has a house in [place] that she lives with her husband. Her husband is currently deployed.   Her mother provides support and will provide transportation when d/c from hospital. All of her provides are at [facility]. No issues getting medications at this time.  Cm will continue to follow for TOC needs. Readmission prevention completed.   Barriers to Discharge: Continued Medical Work up   Patient Goals and CMS Choice            Expected Discharge Plan and Services       Living arrangements for the past 2 months: Single Family Home                                      Prior Living Arrangements/Services Living arrangements for the past 2 months: Single Family Home Lives with:: Self, Parents                   Activities of Daily Living Home Assistive Devices/Equipment: None ADL Screening (condition at time of admission) Patient's cognitive ability adequate to safely complete daily activities?: Yes Is the patient deaf or have difficulty hearing?: No Does the patient have difficulty seeing, even when wearing glasses/contacts?: No Does the patient have difficulty concentrating, remembering, or making decisions?: No Patient able to express need for assistance with ADLs?: Yes Does the patient have difficulty dressing or bathing?: No Independently performs ADLs?: Yes (appropriate for developmental age) Does the patient have difficulty walking or climbing stairs?: No Weakness of Legs: None Weakness of Arms/Hands: None  Permission Sought/Granted                  Emotional Assessment    Attitude/Demeanor/Rapport: Engaged Affect (typically observed): Calm Orientation: : Oriented to Self, Oriented to Place, Oriented to  Time, Oriented to Situation      Admission diagnosis:  Dehydration [E86.0] Seizure (HCC) [R56.9] Hyperglycemia [R73.9] Seizure-like activity (HCC) [R56.9] Thyrotoxicosis with thyrotoxic crisis, unspecified thyrotoxicosis type [E05.91] Patient Active Problem List   Diagnosis Date Noted   Seizure (HCC) 07/27/2022   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   Thyrotoxicosis 12/31/2021   Seizure disorder (HCC)    Hyperthyroidism 10/27/2021   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   Microcytic anemia 02/12/2021   Uncontrolled type 1 diabetes mellitus with hyperglycemia, with long-term current use of insulin (HCC) 02/12/2021   Left inguinal abscess 02/12/2021   Asymptomatic bacteriuria 02/12/2021   Nicotine dependence 02/12/2021   Abdominal pain 02/22/2020   Chronic constipation 02/22/2020   Nausea & vomiting 02/22/2020   SIRS (systemic inflammatory response syndrome) (HCC) 01/14/2020   Diabetic gastroparesis (HCC) 11/25/2019   Polysubstance abuse (HCC) 11/25/2019   Gastritis 11/25/2019   DKA, type 1 (HCC) 11/24/2019   Cyclical vomiting with nausea 10/01/2019   Intractable nausea and vomiting 09/30/2019   Cellulitis    DKA (diabetic ketoacidoses) 01/27/2019   Leukocytosis 12/19/2018   Dehydration    Pseudoseizures (HCC)    Type  1 diabetes mellitus with ketoacidosis without coma (HCC) 11/28/2018   Hyperglycemia 11/27/2018   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   MDD (major depressive disorder), recurrent episode, severe (HCC) 11/06/2018   Nonspecific abnormal electrocardiogram (ECG) (EKG)    Chest pain of uncertain etiology    Hypertensive urgency 10/08/2018   Conversion disorder with attacks or seizures, acute episode, with psychological stressor 09/16/2018   MDD (major depressive disorder), recurrent severe,  without psychosis (HCC)    Hypokalemia 08/04/2018   AKI (acute kidney injury) (HCC) 08/04/2018   Seizure-like activity (HCC) 08/03/2018   Depression 07/25/2018   Syncope 01/30/2018   Orthostatic hypotension 01/24/2018   Tachycardia 12/28/2017   Chronic abdominal pain 12/24/2017   Chest pain 12/19/2017   Nausea and vomiting 08/21/2017   Generalized abdominal pain 08/21/2017   Non compliance with medical treatment 01/27/2012   Adjustment disorder 09/16/2011   Acanthosis nigricans    Goiter    Obesity 06/14/2010   Hypertension associated with diabetes (HCC) 06/14/2010   PCP:  Lucienne Minks, FNP Pharmacy:   [id number] - [place], [place] - [address] [place] [address] Phone: [phone] Fax: [phone]     Social Determinants of Health (SDOH) Social History: SDOH Screenings   Food Insecurity: Food Insecurity Present (07/28/2022)  Housing: Low Risk  (07/28/2022)  Transportation Needs: No Transportation Needs (07/28/2022)  Recent Concern: Transportation Needs - Unmet Transportation Needs (06/13/2022)   Received from [facility]  Utilities: Not At Risk (07/28/2022)  Alcohol Screen: Low Risk  (11/06/2018)  Depression (PHQ2-9): Low Risk  (12/06/2018)  Recent Concern: Depression (PHQ2-9) - Medium Risk (10/30/2018)  Financial Resource Strain: Medium Risk (06/13/2022)   Received from [facility]  Tobacco Use: Low Risk  (07/27/2022)   SDOH Interventions:     Readmission Risk Interventions    08/22/2021   10:32 AM 11/26/2019   11:12 AM  Readmission Risk Prevention Plan  Transportation Screening Complete Complete  Medication Review (RN Care Manager) Complete   PCP or Specialist appointment within 3-5 days of discharge Complete Not Complete  PCP/Specialist Appt Not Complete comments  Unable to reach Dr Zollie Scale office on Friday.  LM.  Discussed with pt that she should call if goes home on weekend.  HRI or Home Care Consult   Complete  SW Recovery Care/Counseling Consult Complete Complete  Palliative Care Screening Not Applicable Not Applicable  Skilled Nursing Facility Not Applicable Not Applicable

## 2022-07-28 NOTE — Progress Notes (Addendum)
1610 Complains of severe abdominal pain. Rocking in bed. Given K-pad as requested for pain.Also medicated for pain. 0900 Patient sleeping when rechecked. 1000 Rymthmic jerking by patient. No loss of consciousness,incontinence or post ictal phase noted. When name called jerking discontinued. Ask about home medications-specifically Morphine and Oxycodone.  1030 Called into patient's room. Shown a picture of a medicine bottle for Oxycodone.  1200 Dr. Nelson Chimes in to see patient. Ordered Remus Loffler for abdominal pain. 1230 Levsin given. Patient attempting to eat lunch. States her abdominal hurts. Explained that Levsin would help with pain. 1300 Sleeping  1400 Rechecked blood sugar. Reported to Dr. Nelson Chimes. Verbal order for additional 4 units of regular insulin to be given before discharge and also give oxycodone 5mg  early before discharge. Both orders done. Patient jumped out of bed and got dressed to go home. Discharge teaching offered but patient was not interested.  1431 Patientl eft floor with belongings and walked down to the ED tomeet her ride home. Patient refused to wait for wheelchair ride to ED.   \

## 2022-07-28 NOTE — Discharge Instructions (Signed)
Follow up with Endocrinologist.

## 2022-07-28 NOTE — Inpatient Diabetes Management (Addendum)
Inpatient Diabetes Program Recommendations  AACE/ADA: New Consensus Statement on Inpatient Glycemic Control (2015)  Target Ranges:  Prepandial:   less than 140 mg/dL      Peak postprandial:   less than 180 mg/dL (1-2 hours)      Critically ill patients:  140 - 180 mg/dL   Lab Results  Component Value Date   GLUCAP 144 (H) 07/28/2022   HGBA1C 6.5 (H) 01/01/2022    Review of Glycemic Control  Latest Reference Range & Units 07/27/22 07:57 07/27/22 11:12 07/27/22 15:27 07/27/22 21:19 07/28/22 07:39  Glucose-Capillary 70 - 99 mg/dL 161 (H) 096 (H) 045 (H) 155 (H) 144 (H)   Diabetes history: DM 2 Outpatient Diabetes medications:  Lantus 60 units bid (however unsure if she was taking prior to admit)  Current orders for Inpatient glycemic control:  Semglee 10 units daily Novolog 0-15 units tid with meals and HS Decadron 4 mg IV q 8 hours  Inpatient Diabetes Program Recommendations:    A1C pending. Unsure if patient was taking insulin prior to admit. Will follow.   Addendum:  Blood sugar up to 316 mg/dL at 12 noon.  Consider adding Novolog meal coverage 4 units tid with meals and increase Semglee to 18 units q HS.    Thanks,  Beryl Meager, RN, BC-ADM Inpatient Diabetes Coordinator Pager [phone]  (8a-5p)

## 2022-10-09 ENCOUNTER — Other Ambulatory Visit: Payer: Self-pay

## 2022-10-09 ENCOUNTER — Emergency Department
Admission: EM | Admit: 2022-10-09 | Discharge: 2022-10-10 | Disposition: A | Discharge status: Home / Self Care | Payer: Medicaid Other | Attending: Emergency Medicine | Admitting: Emergency Medicine

## 2022-10-09 DIAGNOSIS — R112 Nausea with vomiting, unspecified: Secondary | ICD-10-CM

## 2022-10-09 DIAGNOSIS — R569 Unspecified convulsions: Secondary | ICD-10-CM | POA: Diagnosis present

## 2022-10-09 DIAGNOSIS — I5032 Chronic diastolic (congestive) heart failure: Secondary | ICD-10-CM | POA: Diagnosis not present

## 2022-10-09 DIAGNOSIS — R197 Diarrhea, unspecified: Secondary | ICD-10-CM | POA: Diagnosis not present

## 2022-10-09 DIAGNOSIS — Z794 Long term (current) use of insulin: Secondary | ICD-10-CM | POA: Insufficient documentation

## 2022-10-09 DIAGNOSIS — E039 Hypothyroidism, unspecified: Secondary | ICD-10-CM | POA: Insufficient documentation

## 2022-10-09 DIAGNOSIS — G40909 Epilepsy, unspecified, not intractable, without status epilepticus: Secondary | ICD-10-CM | POA: Insufficient documentation

## 2022-10-09 DIAGNOSIS — R109 Unspecified abdominal pain: Secondary | ICD-10-CM | POA: Diagnosis not present

## 2022-10-09 DIAGNOSIS — E119 Type 2 diabetes mellitus without complications: Secondary | ICD-10-CM | POA: Insufficient documentation

## 2022-10-09 LAB — CBC
HCT: 36.8 % (ref 36.0–46.0)
Hemoglobin: 12.2 g/dL (ref 12.0–15.0)
MCH: 26 pg (ref 26.0–34.0)
MCHC: 33.2 g/dL (ref 30.0–36.0)
MCV: 78.3 fL — ABNORMAL LOW (ref 80.0–100.0)
Platelets: 245 10*3/uL (ref 150–400)
RBC: 4.7 MIL/uL (ref 3.87–5.11)
RDW: 14.6 % (ref 11.5–15.5)
WBC: 11.1 10*3/uL — ABNORMAL HIGH (ref 4.0–10.5)
nRBC: 0 % (ref 0.0–0.2)

## 2022-10-09 MED ORDER — KETOROLAC TROMETHAMINE 15 MG/ML IJ SOLN
15.0000 mg | Freq: Once | INTRAMUSCULAR | Status: AC
  Administered 2022-10-10: 15 mg via INTRAVENOUS
  Filled 2022-10-09: qty 1

## 2022-10-09 MED ORDER — SODIUM CHLORIDE 0.9 % IV BOLUS
1000.0000 mL | Freq: Once | INTRAVENOUS | Status: AC
  Administered 2022-10-10: 1000 mL via INTRAVENOUS

## 2022-10-09 MED ORDER — OXYCODONE-ACETAMINOPHEN 5-325 MG PO TABS
1.0000 | ORAL_TABLET | Freq: Once | ORAL | Status: AC
  Administered 2022-10-10: 1 via ORAL
  Filled 2022-10-09: qty 1

## 2022-10-09 MED ORDER — LORAZEPAM 2 MG/ML IJ SOLN
INTRAMUSCULAR | Status: AC
  Administered 2022-10-09: 2 mg via INTRAVENOUS
  Filled 2022-10-09: qty 1

## 2022-10-09 MED ORDER — DROPERIDOL 2.5 MG/ML IJ SOLN
2.5000 mg | Freq: Once | INTRAMUSCULAR | Status: AC
  Administered 2022-10-10: 2.5 mg via INTRAVENOUS
  Filled 2022-10-09: qty 2

## 2022-10-09 MED ORDER — LORAZEPAM 2 MG/ML IJ SOLN: 2.0000 mg | Freq: Once | INTRAMUSCULAR | Status: AC

## 2022-10-09 NOTE — ED Provider Notes (Addendum)
[facility] Provider Note    Event Date/Time   First MD Initiated Contact with Patient 10/09/22 2320     (approximate)   History   Abdominal Pain   HPI  Nancy [name] is a 22 y.o. 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  This is in the setting of nausea vomiting and diarrhea and abdominal cramping pain.  No fevers.  No urinary symptoms.  Has been fully compliant with her diabetic medications thyroid medications and seizure medications.  She denies any trauma.  She denies any shortness of breath or chest pain.  She had a witnessed seizure-like activity but was able to communicate, react, with no postictal period.  She was given 2 mg of Ativan IV prior to my assessment with the patient.   External Medical Documents Reviewed: Discharge summary from July 2024 when she presented with mild hyperthyroid, increase in seizure activity, at that time noted to be asking for pain medication, stating that she has follow-up in pain clinic but no records were found of prescription pain medications per dc summary      Physical Exam   Triage Vital Signs: ED Triage Vitals  Encounter Vitals Group     BP --      Systolic BP Percentile --      Diastolic BP Percentile --      Pulse Rate 10/09/22 2321 96     Resp 10/09/22 2321 11     Temp --      Temp src --      SpO2 10/09/22 2253 95 %     Weight --      Height --      Head Circumference --      Peak Flow --      Pain Score 10/09/22 2306 10     Pain Loc --      Pain Education --      Exclude from Growth Chart --     Most recent vital signs: Vitals:   10/09/22 2321 10/09/22 2326  BP: (!) 165/92   Pulse: 96   Resp: 11   Temp:  99.1 F  (37.3 C)  SpO2: 100%     General: Awake, no distress.  CV:  Good peripheral perfusion.  Resp:  Normal effort.  Abd:  No distention.  Other:  Awake alert pleasant oriented, engaging in full conversation with me with no seizure-like activity.  Moving all extremities with full active range of motion spontaneously, following commands, no signs of head injury, clear lungs soft nontender abdomen to deep palpation all quadrants and appears euvolemic.  Mildly hypertensive 160s over 90s, afebrile, and a pulse rate of 96, no respiratory distress no tachypnea and oxygen saturation is 100% on room air.  No signs of acute alcohol or benzodiazepine withdrawal with no tongue fasciculations or tremors.   ED Results / Procedures / Treatments   Labs (all labs ordered are listed, but only abnormal results are displayed) Labs Reviewed  CBC - Abnormal; Notable for the following components:      Result Value   WBC 11.1 (*)    MCV 78.3 (*)    All other components within normal limits  LIPASE, BLOOD  COMPREHENSIVE METABOLIC PANEL  URINALYSIS, ROUTINE W REFLEX MICROSCOPIC  TSH  T4, FREE  MAGNESIUM  PHOSPHORUS  LACTIC ACID, PLASMA  LACTIC ACID, PLASMA  HCG, QUANTITATIVE, PREGNANCY  POC URINE PREG, ED     I ordered and reviewed the above labs they are notable for white blood cell count is mildly elevated 11.1.  EKG  ED ECG REPORT I, Pilar Jarvis, the attending physician, personally viewed and interpreted this ECG.   Date: 10/09/2022  EKG Time: 2321  Rate: 90  Rhythm: nsr  Axis: nl  Intervals:none  ST&T Change: no stemi    PROCEDURES:  Critical Care performed: No  Procedures   MEDICATIONS ORDERED IN ED: Medications  sodium chloride 0.9 % bolus 1,000 mL (has no administration in time range)  LORazepam (ATIVAN) injection 2 mg (2 mg Intravenous Given 10/09/22 2323)  oxyCODONE-acetaminophen (PERCOCET/ROXICET) 5-325 MG per tablet 1 tablet (1 tablet Oral Given 10/10/22 0003)  ketorolac  (TORADOL) 15 MG/ML injection 15 mg (15 mg Intravenous Given 10/10/22 0006)  droperidol (INAPSINE) 2.5 MG/ML injection 2.5 mg (2.5 mg Intravenous Given 10/10/22 0006)    IMPRESSION / MDM / ASSESSMENT AND PLAN / ED COURSE  I reviewed the triage vital signs and the nursing notes.                                Patient's presentation is most consistent with acute presentation with potential threat to life or bodily function.  Differential diagnosis includes, but is not limited to, psychogenic nonepileptic seizures, seizures, dka, electrolyte derangements, hyperthyroid, viral gastroenteritis, dehydration, pregnancy related, intra-abdominal infection or obstruction  The patient is on the cardiac monitor to evaluate for evidence of arrhythmia and/or significant heart rate changes.  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  She has been fully compliant with medications.    She has a soft nontender benign abdominal exam so rules against surgical abdominal pathologies.    I considered thyroid emergencies but her clinical picture is not consistent with thyroid storm, will check TSH and T4.    Check electrolytes in the setting of viral gastroenteritis and give fluids for likely some mild dehydration.    Check pregnancy, doubt urinary tract infection given no urinary symptoms.    Patient requesting narcotic pain medication stating that she has a prescription for several narcotic pain medications chronically but I do not see any in our PDMP review so we will give a single dose of oxycodone in addition to Toradol, droperidol, fluids here but will defer any chronic pain management scripts to her PMD or pain doctor.  -- Fortunately workup unremarkable, patient resting comfortably, hemodynamics remained  stable, plan is for discharge.       FINAL CLINICAL IMPRESSION(S) / ED DIAGNOSES   Final diagnoses:  Nausea, vomiting and diarrhea  Seizure-like activity (HCC)     Rx / DC Orders   ED Discharge Orders     None        Note:  This document was prepared using Dragon voice recognition software and may include unintentional dictation errors.    Pilar Jarvis, MD 10/10/22 Reola Mosher    Pilar Jarvis, MD 10/10/22 Newton Pigg    Pilar Jarvis, MD 10/10/22 [phone]

## 2022-10-09 NOTE — ED Provider Notes (Incomplete)
[facility] Provider Note    Event Date/Time   First MD Initiated Contact with Patient 10/09/22 2320     (approximate)   History   Abdominal Pain   HPI  Nancy [name] is a 22 y.o. female   Past medical history of ***    Independent Historian contributed to assessment above: ***  External Medical Documents Reviewed: ***      Physical Exam   Triage Vital Signs: ED Triage Vitals  Encounter Vitals Group     BP --      Systolic BP Percentile --      Diastolic BP Percentile --      Pulse Rate 10/09/22 2321 96     Resp 10/09/22 2321 11     Temp --      Temp src --      SpO2 10/09/22 2253 95 %     Weight --      Height --      Head Circumference --      Peak Flow --      Pain Score 10/09/22 2306 10     Pain Loc --      Pain Education --      Exclude from Growth Chart --     Most recent vital signs: Vitals:   10/09/22 2253 10/09/22 2321  Pulse:  96  Resp:  11  SpO2: 95% 100%    General: Awake, no distress. *** CV:  Good peripheral perfusion. *** Resp:  Normal effort. *** Abd:  No distention. *** Other:  ***   ED Results / Procedures / Treatments   Labs (all labs ordered are listed, but only abnormal results are displayed) Labs Reviewed  LIPASE, BLOOD  COMPREHENSIVE METABOLIC PANEL  CBC  URINALYSIS, ROUTINE W REFLEX MICROSCOPIC  TSH  T4, FREE  MAGNESIUM  PHOSPHORUS  LACTIC ACID, PLASMA  LACTIC ACID, PLASMA  POC URINE PREG, ED     I ordered and reviewed the above labs they are notable for ***  EKG  ED ECG REPORT I, Pilar Jarvis, the attending physician, personally viewed and interpreted this ECG.   Date: 10/09/2022  EKG Time: ***  Rate: ***  Rhythm: {ekg findings:315101}  Axis: ***  Intervals:{conduction defects:17367}  ST&T Change: ***    RADIOLOGY I independently reviewed and interpreted *** I also reviewed radiologist's formal read.   PROCEDURES:  Critical Care performed:  {CriticalCareYesNo:19197::"Yes, see critical care procedure note(s)","No"}  Procedures   MEDICATIONS ORDERED IN ED: Medications - No data to display  External physician / consultants:  I spoke with *** regarding care plan for this patient.   IMPRESSION / MDM / ASSESSMENT AND PLAN / ED COURSE  I reviewed the triage vital signs and the nursing notes.                                Patient's presentation is most consistent with {EM COPA:27473}  Differential diagnosis includes, but is not limited to, ***   ***The patient is on the cardiac monitor to evaluate for evidence of arrhythmia and/or significant heart rate changes.  MDM:  ***  I considered hospitalization for admission or observation ***        FINAL CLINICAL IMPRESSION(S) / ED DIAGNOSES   Final diagnoses:  None     Rx / DC Orders   ED Discharge Orders     None  Note:  This document was prepared using Dragon voice recognition software and may include unintentional dictation errors.

## 2022-10-09 NOTE — ED Triage Notes (Signed)
EMS brings pt in from home for c/o abd pain accomp by N/V/D

## 2022-10-09 NOTE — ED Triage Notes (Signed)
Pt presents via EMS from home c/o N/V/D since Tuesday. Reports hx of seizures, reports had "20+ seizures" per her mother pt states. A&Ox4 at this time. Reports generalized abd pain since Tuesday.

## 2022-10-09 NOTE — ED Notes (Signed)
Pt had seizure while in triage. Taken to ED treatment room. Pt has hx of seizures. Unresponsive s/p seizure for apx 3 mins, Pt now alert.

## 2022-10-10 LAB — COMPREHENSIVE METABOLIC PANEL
ALT: 16 U/L (ref 0–44)
AST: 15 U/L (ref 15–41)
Albumin: 4.1 g/dL (ref 3.5–5.0)
Alkaline Phosphatase: 101 U/L (ref 38–126)
Anion gap: 8 (ref 5–15)
BUN: 11 mg/dL (ref 6–20)
CO2: 24 mmol/L (ref 22–32)
Calcium: 8.8 mg/dL — ABNORMAL LOW (ref 8.9–10.3)
Chloride: 104 mmol/L (ref 98–111)
Creatinine, Ser: 0.44 mg/dL (ref 0.44–1.00)
GFR, Estimated: >60 mL/min (ref >60)
Glucose, Bld: 150 mg/dL — ABNORMAL HIGH (ref 70–99)
Potassium: 3.1 mmol/L — ABNORMAL LOW (ref 3.5–5.1)
Sodium: 136 mmol/L (ref 135–145)
Total Bilirubin: 1.1 mg/dL (ref 0.3–1.2)
Total Protein: 7.7 g/dL (ref 6.5–8.1)

## 2022-10-10 LAB — LIPASE, BLOOD: Lipase: 20 U/L (ref 11–51)

## 2022-10-10 LAB — TSH: TSH: <0.01 u[IU]/mL — ABNORMAL LOW (ref 0.350–4.500)

## 2022-10-10 LAB — PHOSPHORUS: Phosphorus: 3.1 mg/dL (ref 2.5–4.6)

## 2022-10-10 LAB — T4, FREE: Free T4: 1.97 ng/dL — ABNORMAL HIGH (ref 0.61–1.12)

## 2022-10-10 LAB — LACTIC ACID, PLASMA: Lactic Acid, Venous: 1 mmol/L (ref 0.5–1.9)

## 2022-10-10 LAB — MAGNESIUM: Magnesium: 2.1 mg/dL (ref 1.7–2.4)

## 2022-10-10 LAB — HCG, QUANTITATIVE, PREGNANCY: hCG, Beta Chain, Quant, S: <1 m[IU]/mL (ref <5)

## 2022-10-10 MED ORDER — ONDANSETRON HCL 4 MG PO TABS: 4.0000 mg | ORAL_TABLET | Freq: Every day | ORAL | 1 refills | Status: DC | PRN

## 2022-10-10 MED ORDER — POTASSIUM CHLORIDE CRYS ER 20 MEQ PO TBCR
40.0000 meq | EXTENDED_RELEASE_TABLET | Freq: Once | ORAL | Status: AC
  Administered 2022-10-10: 40 meq via ORAL
  Filled 2022-10-10: qty 2

## 2022-10-10 NOTE — Discharge Instructions (Addendum)
Drink plenty of fluids to stay well-hydrated.  Find Pedialyte or similar electrolyte rehydration formulas at your local pharmacy.  Take all your medications as prescribed.  Thank you for choosing [place] for your health care today!  Please see your primary doctor this week for a follow up appointment.   If you have any new, worsening, or unexpected symptoms call your doctor right away or come back to the emergency department for reevaluation.  It was my pleasure to care for you today.   Daneil Dan Modesto Charon, MD

## 2023-02-03 ENCOUNTER — Emergency Department
Admission: EM | Admit: 2023-02-03 | Discharge: 2023-02-03 | Disposition: A | Discharge status: Home / Self Care | Payer: Medicaid Other | Attending: Emergency Medicine | Admitting: Emergency Medicine

## 2023-02-03 ENCOUNTER — Other Ambulatory Visit: Payer: Self-pay

## 2023-02-03 ENCOUNTER — Encounter: Payer: Self-pay | Admitting: Intensive Care

## 2023-02-03 ENCOUNTER — Emergency Department: Payer: Medicaid Other

## 2023-02-03 DIAGNOSIS — E119 Type 2 diabetes mellitus without complications: Secondary | ICD-10-CM | POA: Diagnosis not present

## 2023-02-03 DIAGNOSIS — I10 Essential (primary) hypertension: Secondary | ICD-10-CM | POA: Insufficient documentation

## 2023-02-03 DIAGNOSIS — M25551 Pain in right hip: Secondary | ICD-10-CM | POA: Insufficient documentation

## 2023-02-03 DIAGNOSIS — R569 Unspecified convulsions: Secondary | ICD-10-CM | POA: Diagnosis present

## 2023-02-03 LAB — CBC
HCT: 34.8 % — ABNORMAL LOW (ref 36.0–46.0)
Hemoglobin: 11.2 g/dL — ABNORMAL LOW (ref 12.0–15.0)
MCH: 25.3 pg — ABNORMAL LOW (ref 26.0–34.0)
MCHC: 32.2 g/dL (ref 30.0–36.0)
MCV: 78.6 fL — ABNORMAL LOW (ref 80.0–100.0)
Platelets: 235 10*3/uL (ref 150–400)
RBC: 4.43 MIL/uL (ref 3.87–5.11)
RDW: 14.4 % (ref 11.5–15.5)
WBC: 4.9 10*3/uL (ref 4.0–10.5)
nRBC: 0 % (ref 0.0–0.2)

## 2023-02-03 LAB — BASIC METABOLIC PANEL
Anion gap: 7 (ref 5–15)
BUN: 8 mg/dL (ref 6–20)
CO2: 26 mmol/L (ref 22–32)
Calcium: 9 mg/dL (ref 8.9–10.3)
Chloride: 108 mmol/L (ref 98–111)
Creatinine, Ser: 0.43 mg/dL — ABNORMAL LOW (ref 0.44–1.00)
GFR, Estimated: >60 mL/min (ref >60)
Glucose, Bld: 147 mg/dL — ABNORMAL HIGH (ref 70–99)
Potassium: 3.8 mmol/L (ref 3.5–5.1)
Sodium: 141 mmol/L (ref 135–145)

## 2023-02-03 LAB — HCG, QUANTITATIVE, PREGNANCY: hCG, Beta Chain, Quant, S: <1 m[IU]/mL (ref <5)

## 2023-02-03 MED ORDER — LAMOTRIGINE 100 MG PO TABS
100.0000 mg | ORAL_TABLET | Freq: Once | ORAL | Status: AC
  Administered 2023-02-03: 100 mg via ORAL
  Filled 2023-02-03: qty 1

## 2023-02-03 MED ORDER — LIDOCAINE 5 % EX PTCH: 1.0000 | MEDICATED_PATCH | Freq: Two times a day (BID) | CUTANEOUS | 0 refills | Status: DC

## 2023-02-03 MED ORDER — LIDOCAINE 5 % EX PTCH
1.0000 | MEDICATED_PATCH | CUTANEOUS | Status: DC
  Administered 2023-02-03: 1 via TRANSDERMAL
  Filled 2023-02-03: qty 1

## 2023-02-03 MED ORDER — OXYCODONE HCL 5 MG PO TABS
5.0000 mg | ORAL_TABLET | Freq: Once | ORAL | Status: AC
  Administered 2023-02-03: 5 mg via ORAL
  Filled 2023-02-03: qty 1

## 2023-02-03 NOTE — ED Provider Notes (Signed)
[facility] Provider Note    Event Date/Time   First MD Initiated Contact with Patient 02/03/23 1122     (approximate)   History   Chief Complaint Seizures   HPI  Nancy [name] is a 23 y.o. female with past medical history of hypertension, diabetes, Graves' disease, psychogenic nonepileptic seizures, and chronic pain syndrome who presents to the ED complaining of seizure.  Patient reports that she had a seizure earlier this morning that caused her to fall to the ground and hit her right hip.  She states that her mother witnessed the seizure and told her that she had full body shaking, however patient is not sure how long the seizure lasted.  She states she has had similar episodes over the past couple of days despite taking her prescribed Lamictal and Keppra.  She denies any headache or neck pain following the fall.     Physical Exam   Triage Vital Signs: ED Triage Vitals  Encounter Vitals Group     BP 02/03/23 0816 129/76     Systolic BP Percentile --      Diastolic BP Percentile --      Pulse Rate 02/03/23 0816 (!) 108     Resp 02/03/23 0816 16     Temp 02/03/23 0816 98.8 F (37.1 C)     Temp Source 02/03/23 0816 Oral     SpO2 02/03/23 0816 98 %     Weight 02/03/23 0817 189 lb (85.7 kg)     Height 02/03/23 0817 5\' 3"  (1.6 m)     Head Circumference --      Peak Flow --      Pain Score 02/03/23 0817 8     Pain Loc --      Pain Education --      Exclude from Growth Chart --     Most recent vital signs: Vitals:   02/03/23 0816  BP: 129/76  Pulse: (!) 108  Resp: 16  Temp: 98.8 F (37.1 C)  SpO2: 98%    Constitutional: Alert and oriented. Eyes: Conjunctivae are normal. Head: Atraumatic. Nose: No congestion/rhinnorhea. Mouth/Throat: Mucous membranes are moist.  Neck: No midline cervical spine tenderness to palpation. Cardiovascular: Normal rate, regular rhythm. Grossly normal heart sounds.  2+ radial and DP pulses  bilaterally. Respiratory: Normal respiratory effort.  No retractions. Lungs CTAB. Gastrointestinal: Soft and nontender. No distention. Musculoskeletal: Diffuse tenderness to palpation of right hip with no obvious deformity.  No tenderness to palpation at left hip, bilateral knees, or bilateral ankles. Neurologic:  Normal speech and language. No gross focal neurologic deficits are appreciated.    ED Results / Procedures / Treatments   Labs (all labs ordered are listed, but only abnormal results are displayed) Labs Reviewed  BASIC METABOLIC PANEL - Abnormal; Notable for the following components:      Result Value   Glucose, Bld 147 (*)    Creatinine, Ser 0.43 (*)    All other components within normal limits  CBC - Abnormal; Notable for the following components:   Hemoglobin 11.2 (*)    HCT 34.8 (*)    MCV 78.6 (*)    MCH 25.3 (*)    All other components within normal limits  HCG, QUANTITATIVE, PREGNANCY  POC URINE PREG, ED  CBG MONITORING, ED     EKG  ED ECG REPORT I, Chesley Noon, the attending physician, personally viewed and interpreted this ECG.   Date: 02/03/2023  EKG Time: 8:20  Rate:  102  Rhythm: sinus tachycardia  Axis: Normal  Intervals:none  ST&T Change: None  RADIOLOGY Right hip x-ray reviewed and interpreted by me with no fracture or dislocation.  PROCEDURES:  Critical Care performed: No  Procedures   MEDICATIONS ORDERED IN ED: Medications  lidocaine (LIDODERM) 5 % 1 patch (has no administration in time range)  lamoTRIgine (LAMICTAL) tablet 100 mg (100 mg Oral Given 02/03/23 1154)  oxyCODONE (Oxy IR/ROXICODONE) immediate release tablet 5 mg (5 mg Oral Given 02/03/23 1153)     IMPRESSION / MDM / ASSESSMENT AND PLAN / ED COURSE  I reviewed the triage vital signs and the nursing notes.                              23 y.o. female with past medical history of hypertension, diabetes, Graves' disease, psychogenic nonepileptic seizures, and chronic  pain syndrome who presents to the ED complaining of right hip pain after seizure and fall.  Patient's presentation is most consistent with acute presentation with potential threat to life or bodily function.  Differential diagnosis includes, but is not limited to, fracture, dislocation, breakthrough seizure, psychogenic nonepileptic seizures, medication noncompliance, anemia, electrolyte abnormality, AKI.  Patient nontoxic-appearing and in no acute distress, vital signs are unremarkable.  She primarily complains of right hip pain, will further assess with x-ray and treat with dose of oxycodone.  She is neurovascularly intact distally, no evidence of other traumatic injury to her extremities, trunk, head, or neck.  Labs without significant anemia, leukocytosis, lactate abnormality, AKI.  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  We will give her midday dose of Lamictal but do not feel any adjustments in her seizure medication needed at this time.  Hip x-ray is unremarkable, no further seizure episodes here in the ED.  Patient is appropriate for discharge home with outpatient neurology and PCP follow-up.  She was counseled to return to the ED for new or worsening symptoms, patient agrees with plan.      FINAL CLINICAL IMPRESSION(S) / ED DIAGNOSES   Final diagnoses:  Seizure-like activity (HCC)  Right hip pain     Rx / DC Orders   ED Discharge Orders          Ordered    lidocaine (LIDODERM) 5 %  Every 12 hours        02/03/23 1313             Note:  This document was prepared using Dragon voice recognition software and may include unintentional dictation errors.   Chesley Noon, MD 02/03/23 1314

## 2023-02-03 NOTE — ED Triage Notes (Addendum)
Patient arrived by [facility] from home. Patient reports having seizure last night and today. Reports seeing her neurologist on 12/28 who told her to increase her lamictal.  Denies injuries. A&O x4 in triage  History seizures(lamictal, keppra), CHF, and diabetes

## 2023-02-03 NOTE — ED Notes (Signed)
Patient transported to X-ray

## 2023-03-05 ENCOUNTER — Encounter: Payer: Self-pay | Admitting: Emergency Medicine

## 2023-03-05 ENCOUNTER — Emergency Department
Admission: EM | Admit: 2023-03-05 | Discharge: 2023-03-05 | Disposition: A | Discharge status: Home / Self Care | Payer: Medicaid Other | Attending: Emergency Medicine | Admitting: Emergency Medicine

## 2023-03-05 ENCOUNTER — Emergency Department: Payer: Medicaid Other

## 2023-03-05 ENCOUNTER — Other Ambulatory Visit: Payer: Self-pay

## 2023-03-05 DIAGNOSIS — R569 Unspecified convulsions: Secondary | ICD-10-CM | POA: Insufficient documentation

## 2023-03-05 DIAGNOSIS — R519 Headache, unspecified: Secondary | ICD-10-CM | POA: Insufficient documentation

## 2023-03-05 DIAGNOSIS — R000 Tachycardia, unspecified: Secondary | ICD-10-CM | POA: Diagnosis not present

## 2023-03-05 DIAGNOSIS — E119 Type 2 diabetes mellitus without complications: Secondary | ICD-10-CM | POA: Insufficient documentation

## 2023-03-05 LAB — CBC WITH DIFFERENTIAL/PLATELET
Abs Immature Granulocytes: 0.03 10*3/uL (ref 0.00–0.07)
Basophils Absolute: 0 10*3/uL (ref 0.0–0.1)
Basophils Relative: 0 %
Eosinophils Absolute: 0 10*3/uL (ref 0.0–0.5)
Eosinophils Relative: 0 %
HCT: 37.5 % (ref 36.0–46.0)
Hemoglobin: 12.3 g/dL (ref 12.0–15.0)
Immature Granulocytes: 0 %
Lymphocytes Relative: 16 %
Lymphs Abs: 1.8 10*3/uL (ref 0.7–4.0)
MCH: 25.2 pg — ABNORMAL LOW (ref 26.0–34.0)
MCHC: 32.8 g/dL (ref 30.0–36.0)
MCV: 76.8 fL — ABNORMAL LOW (ref 80.0–100.0)
Monocytes Absolute: 1.1 10*3/uL — ABNORMAL HIGH (ref 0.1–1.0)
Monocytes Relative: 10 %
Neutro Abs: 8 10*3/uL — ABNORMAL HIGH (ref 1.7–7.7)
Neutrophils Relative %: 74 %
Platelets: 268 10*3/uL (ref 150–400)
RBC: 4.88 MIL/uL (ref 3.87–5.11)
RDW: 13.9 % (ref 11.5–15.5)
WBC: 11 10*3/uL — ABNORMAL HIGH (ref 4.0–10.5)
nRBC: 0 % (ref 0.0–0.2)

## 2023-03-05 LAB — COMPREHENSIVE METABOLIC PANEL
ALT: 19 U/L (ref 0–44)
AST: 23 U/L (ref 15–41)
Albumin: 3.6 g/dL (ref 3.5–5.0)
Alkaline Phosphatase: 77 U/L (ref 38–126)
Anion gap: 11 (ref 5–15)
BUN: 10 mg/dL (ref 6–20)
CO2: 23 mmol/L (ref 22–32)
Calcium: 8.9 mg/dL (ref 8.9–10.3)
Chloride: 100 mmol/L (ref 98–111)
Creatinine, Ser: 0.48 mg/dL (ref 0.44–1.00)
GFR, Estimated: >60 mL/min (ref >60)
Glucose, Bld: 172 mg/dL — ABNORMAL HIGH (ref 70–99)
Potassium: 3 mmol/L — ABNORMAL LOW (ref 3.5–5.1)
Sodium: 134 mmol/L — ABNORMAL LOW (ref 135–145)
Total Bilirubin: 1 mg/dL (ref 0.0–1.2)
Total Protein: 7.2 g/dL (ref 6.5–8.1)

## 2023-03-05 LAB — HCG, QUANTITATIVE, PREGNANCY: hCG, Beta Chain, Quant, S: <1 m[IU]/mL (ref <5)

## 2023-03-05 LAB — TSH: TSH: <0.01 u[IU]/mL — ABNORMAL LOW (ref 0.350–4.500)

## 2023-03-05 LAB — T4, FREE: Free T4: 3.22 ng/dL — ABNORMAL HIGH (ref 0.61–1.12)

## 2023-03-05 MED ORDER — POTASSIUM CHLORIDE CRYS ER 20 MEQ PO TBCR
40.0000 meq | EXTENDED_RELEASE_TABLET | Freq: Once | ORAL | Status: AC
  Administered 2023-03-05: 40 meq via ORAL
  Filled 2023-03-05: qty 2

## 2023-03-05 MED ORDER — OXYCODONE HCL 5 MG PO TABS
5.0000 mg | ORAL_TABLET | Freq: Once | ORAL | Status: AC
  Administered 2023-03-05: 5 mg via ORAL
  Filled 2023-03-05: qty 1

## 2023-03-05 MED ORDER — HYDROMORPHONE HCL 1 MG/ML IJ SOLN
1.0000 mg | Freq: Once | INTRAMUSCULAR | Status: AC
  Administered 2023-03-05: 1 mg via INTRAVENOUS
  Filled 2023-03-05: qty 1

## 2023-03-05 MED ORDER — SODIUM CHLORIDE 0.9 % IV BOLUS
1000.0000 mL | Freq: Once | INTRAVENOUS | Status: AC
  Administered 2023-03-05: 1000 mL via INTRAVENOUS

## 2023-03-05 NOTE — ED Provider Notes (Signed)
-----------------------------------------   9:17 PM on 03/05/2023 ----------------------------------------- Patient care assumed from Dr. Rosalia Hammers.  CT scan showed possible artifact versus age-indeterminate infarct.  Recommended MRI.  MRI is finally resulted showing no concerning abnormality.  Patient reassured by the workup.  Will discharge home have the patient follow-up with her doctor.  Patient T4 did come back mildly elevated we will have the patient follow-up with her endocrinologist at [facility].   Minna Antis, MD 03/05/23 2119

## 2023-03-05 NOTE — ED Provider Notes (Signed)
 [facility] Provider Note    Event Date/Time   First MD Initiated Contact with Patient 03/05/23 1322     (approximate)   History   Seizures   HPI  Nancy [name] is a 23 year old female with history of T2DM, anemia, psychogenic nonepileptic seizure disorder, chronic pain presenting to the emergency department for evaluation of seizure-like activity.  Per report, mom noted that patient had been having multiple seizure-like episodes overnight.  Is reportedly on Keppra and Lamictal.  EMS reported 3 episodes.  Was given 5 mg of IM Versed and 2 and half milligrams of IM Versed.  Patient awake, complaining of headache at the time of my initial evaluation.     Physical Exam   Triage Vital Signs: ED Triage Vitals  Encounter Vitals Group     BP 03/05/23 1329 139/79     Systolic BP Percentile --      Diastolic BP Percentile --      Pulse Rate 03/05/23 1329 (!) 110     Resp 03/05/23 1329 15     Temp 03/05/23 1332 98.5 F (36.9 C)     Temp Source 03/05/23 1332 Oral     SpO2 03/05/23 1329 100 %     Weight 03/05/23 1330 184 lb 12.8 oz (83.8 kg)     Height 03/05/23 1330 5\' 3"  (1.6 m)     Head Circumference --      Peak Flow --      Pain Score 03/05/23 1329 9     Pain Loc --      Pain Education --      Exclude from Growth Chart --     Most recent vital signs: Vitals:   03/05/23 1329 03/05/23 1332  BP: 139/79   Pulse: (!) 110   Resp: 15   Temp:  98.5 F (36.9 C)  SpO2: 100%      General: Awake, interactive  Head:  Atraumatic CV:  Tachycardic, good peripheral perfusion.  Resp:  Unlabored respirations, lungs clear to auscultation  Abd:  Nondistended Neuro:  No gross facial asymmetry, symmetric weakness of the bilateral upper and lower extremities, fluent speech without appreciable confusion though sometimes slow to respond   ED Results / Procedures / Treatments   Labs (all labs ordered are listed, but only abnormal results are  displayed) Labs Reviewed  CBC WITH DIFFERENTIAL/PLATELET - Abnormal; Notable for the following components:      Result Value   WBC 11.0 (*)    MCV 76.8 (*)    MCH 25.2 (*)    Neutro Abs 8.0 (*)    Monocytes Absolute 1.1 (*)    All other components within normal limits  COMPREHENSIVE METABOLIC PANEL - Abnormal; Notable for the following components:   Sodium 134 (*)    Potassium 3.0 (*)    Glucose, Bld 172 (*)    All other components within normal limits  HCG, QUANTITATIVE, PREGNANCY     EKG EKG independently reviewed interpreted by myself (ER attending) demonstrates:  EKG demonstrates sinus tachycardia at a rate of 109, PR 111, QRS 104, QTc 507, ST changes noted in multiple leads consistent with benign early repolarization, similar to prior from 02/03/2023  RADIOLOGY Imaging independently reviewed and interpreted by myself demonstrates:  CT head pending  PROCEDURES:  Critical Care performed: No  Procedures   MEDICATIONS ORDERED IN ED: Medications  sodium chloride 0.9 % bolus 1,000 mL (1,000 mLs Intravenous New Bag/Given 03/05/23 1425)  potassium chloride SA (KLOR-CON  M) CR tablet 40 mEq (40 mEq Oral Given 03/05/23 1425)  oxyCODONE (Oxy IR/ROXICODONE) immediate release tablet 5 mg (5 mg Oral Given 03/05/23 1427)     IMPRESSION / MDM / ASSESSMENT AND PLAN / ED COURSE  I reviewed the triage vital signs and the nursing notes.  Differential diagnosis includes, but is not limited to, PNES, breakthrough epileptic seizure though I do not see documented history of this, intracranial bleed, electrolyte abnormality  Patient's presentation is most consistent with acute presentation with potential threat to life or bodily function.  23 year old female presenting with seizure-like activity.  Labs with mild hypokalemia, oral repletion ordered.  Otherwise without critical derangements.  Did have another episode of abnormal movements here.  On my evaluation in the room, patient rocking  back and forth, eyes closed not clearly epileptic in nature.  Resolved without intervention.  With head trauma, will obtain CT scan to further evaluate.  If this is normal and patient returns to her baseline, suspect she will be stable for discharge.      FINAL CLINICAL IMPRESSION(S) / ED DIAGNOSES   Final diagnoses:  Seizure-like activity (HCC)     Rx / DC Orders   ED Discharge Orders     None        Note:  This document was prepared using Dragon voice recognition software and may include unintentional dictation errors.   Trinna Post, MD 03/05/23 [phone]

## 2023-03-05 NOTE — ED Notes (Signed)
 Pt to MRI

## 2023-03-05 NOTE — Discharge Instructions (Addendum)
 You were seen in the ER today after a seizure-like episode. Please make sure to take your medication as directed. Let your neurologist know you were seen in the ER and arrange follow-up with them as soon as possible.  Please do not drive, swim or bathe unsupervised, or climb to heights for six months or until otherwise instructed by your doctor.  Return to the ER for new or worsening symptoms.  Your thyroid hormone level did come back mildly elevated.  Please follow-up with your endocrinologist at [facility] for further evaluation.

## 2023-03-05 NOTE — ED Triage Notes (Signed)
 Pt to ED via [facility] from home for seizures. Per EMS pts mother reports that pt has been having seizures all night. Pt has hx/o same and is on Keppra and Lamictal. EMS reports that pt has had 3 witnessed seizures with them. Pt was given 5 mg of IM Versed and 2.5 mg of IV Versed. Pt is alert and speaking on arrival to ED. Pt is c/o Headache.

## 2023-03-06 ENCOUNTER — Encounter: Payer: Self-pay | Admitting: Internal Medicine

## 2023-03-06 ENCOUNTER — Other Ambulatory Visit: Payer: Self-pay

## 2023-03-06 ENCOUNTER — Inpatient Hospital Stay
Admission: EM | Admit: 2023-03-06 | Discharge: 2023-03-09 | DRG: 392 | Disposition: A | Discharge status: Home / Self Care | Payer: Medicaid Other | Attending: Internal Medicine | Admitting: Internal Medicine

## 2023-03-06 DIAGNOSIS — E0590 Thyrotoxicosis, unspecified without thyrotoxic crisis or storm: Secondary | ICD-10-CM | POA: Diagnosis present

## 2023-03-06 DIAGNOSIS — D509 Iron deficiency anemia, unspecified: Secondary | ICD-10-CM | POA: Diagnosis present

## 2023-03-06 DIAGNOSIS — Z800 Family history of malignant neoplasm of digestive organs: Secondary | ICD-10-CM

## 2023-03-06 DIAGNOSIS — F419 Anxiety disorder, unspecified: Secondary | ICD-10-CM | POA: Diagnosis present

## 2023-03-06 DIAGNOSIS — F17290 Nicotine dependence, other tobacco product, uncomplicated: Secondary | ICD-10-CM | POA: Diagnosis present

## 2023-03-06 DIAGNOSIS — Z1152 Encounter for screening for COVID-19: Secondary | ICD-10-CM

## 2023-03-06 DIAGNOSIS — R197 Diarrhea, unspecified: Secondary | ICD-10-CM | POA: Diagnosis present

## 2023-03-06 DIAGNOSIS — Z79899 Other long term (current) drug therapy: Secondary | ICD-10-CM

## 2023-03-06 DIAGNOSIS — R569 Unspecified convulsions: Secondary | ICD-10-CM

## 2023-03-06 DIAGNOSIS — Z886 Allergy status to analgesic agent status: Secondary | ICD-10-CM

## 2023-03-06 DIAGNOSIS — F1910 Other psychoactive substance abuse, uncomplicated: Secondary | ICD-10-CM | POA: Diagnosis present

## 2023-03-06 DIAGNOSIS — R109 Unspecified abdominal pain: Secondary | ICD-10-CM | POA: Diagnosis present

## 2023-03-06 DIAGNOSIS — E876 Hypokalemia: Secondary | ICD-10-CM | POA: Diagnosis present

## 2023-03-06 DIAGNOSIS — R55 Syncope and collapse: Secondary | ICD-10-CM

## 2023-03-06 DIAGNOSIS — F1210 Cannabis abuse, uncomplicated: Secondary | ICD-10-CM | POA: Diagnosis present

## 2023-03-06 DIAGNOSIS — Z833 Family history of diabetes mellitus: Secondary | ICD-10-CM

## 2023-03-06 DIAGNOSIS — G40909 Epilepsy, unspecified, not intractable, without status epilepticus: Principal | ICD-10-CM | POA: Diagnosis present

## 2023-03-06 DIAGNOSIS — E870 Hyperosmolality and hypernatremia: Secondary | ICD-10-CM | POA: Diagnosis not present

## 2023-03-06 DIAGNOSIS — R1115 Cyclical vomiting syndrome unrelated to migraine: Principal | ICD-10-CM | POA: Diagnosis present

## 2023-03-06 DIAGNOSIS — Z87898 Personal history of other specified conditions: Secondary | ICD-10-CM | POA: Insufficient documentation

## 2023-03-06 DIAGNOSIS — Z794 Long term (current) use of insulin: Secondary | ICD-10-CM

## 2023-03-06 DIAGNOSIS — M545 Low back pain, unspecified: Secondary | ICD-10-CM | POA: Diagnosis present

## 2023-03-06 DIAGNOSIS — R112 Nausea with vomiting, unspecified: Principal | ICD-10-CM | POA: Diagnosis present

## 2023-03-06 DIAGNOSIS — G8929 Other chronic pain: Secondary | ICD-10-CM | POA: Diagnosis present

## 2023-03-06 DIAGNOSIS — Z825 Family history of asthma and other chronic lower respiratory diseases: Secondary | ICD-10-CM

## 2023-03-06 DIAGNOSIS — Z8249 Family history of ischemic heart disease and other diseases of the circulatory system: Secondary | ICD-10-CM

## 2023-03-06 DIAGNOSIS — F32A Depression, unspecified: Secondary | ICD-10-CM | POA: Diagnosis present

## 2023-03-06 DIAGNOSIS — Z83438 Family history of other disorder of lipoprotein metabolism and other lipidemia: Secondary | ICD-10-CM

## 2023-03-06 DIAGNOSIS — Z6832 Body mass index (BMI) 32.0-32.9, adult: Secondary | ICD-10-CM

## 2023-03-06 DIAGNOSIS — E109 Type 1 diabetes mellitus without complications: Secondary | ICD-10-CM | POA: Diagnosis present

## 2023-03-06 DIAGNOSIS — I160 Hypertensive urgency: Secondary | ICD-10-CM | POA: Diagnosis present

## 2023-03-06 DIAGNOSIS — E66811 Obesity, class 1: Secondary | ICD-10-CM

## 2023-03-06 DIAGNOSIS — Z8049 Family history of malignant neoplasm of other genital organs: Secondary | ICD-10-CM

## 2023-03-06 DIAGNOSIS — Z9109 Other allergy status, other than to drugs and biological substances: Secondary | ICD-10-CM

## 2023-03-06 DIAGNOSIS — I5032 Chronic diastolic (congestive) heart failure: Secondary | ICD-10-CM | POA: Diagnosis present

## 2023-03-06 DIAGNOSIS — E119 Type 2 diabetes mellitus without complications: Secondary | ICD-10-CM

## 2023-03-06 LAB — BASIC METABOLIC PANEL
Anion gap: 12 (ref 5–15)
BUN: 11 mg/dL (ref 6–20)
CO2: 24 mmol/L (ref 22–32)
Calcium: 9.5 mg/dL (ref 8.9–10.3)
Chloride: 103 mmol/L (ref 98–111)
Creatinine, Ser: 0.5 mg/dL (ref 0.44–1.00)
GFR, Estimated: >60 mL/min (ref >60)
Glucose, Bld: 162 mg/dL — ABNORMAL HIGH (ref 70–99)
Potassium: 3.2 mmol/L — ABNORMAL LOW (ref 3.5–5.1)
Sodium: 139 mmol/L (ref 135–145)

## 2023-03-06 LAB — URINALYSIS, W/ REFLEX TO CULTURE (INFECTION SUSPECTED)
Bacteria, UA: NONE SEEN
Bilirubin Urine: NEGATIVE
Glucose, UA: 50 mg/dL — AB
Hgb urine dipstick: NEGATIVE
Ketones, ur: 20 mg/dL — AB
Leukocytes,Ua: NEGATIVE
Nitrite: NEGATIVE
Protein, ur: 30 mg/dL — AB
Specific Gravity, Urine: 1.025 (ref 1.005–1.030)
pH: 6 (ref 5.0–8.0)

## 2023-03-06 LAB — CBC WITH DIFFERENTIAL/PLATELET
Abs Immature Granulocytes: 0.05 10*3/uL (ref 0.00–0.07)
Basophils Absolute: 0 10*3/uL (ref 0.0–0.1)
Basophils Relative: 0 %
Eosinophils Absolute: 0 10*3/uL (ref 0.0–0.5)
Eosinophils Relative: 0 %
HCT: 42.6 % (ref 36.0–46.0)
Hemoglobin: 13.8 g/dL (ref 12.0–15.0)
Immature Granulocytes: 1 %
Lymphocytes Relative: 24 %
Lymphs Abs: 2.5 10*3/uL (ref 0.7–4.0)
MCH: 24.9 pg — ABNORMAL LOW (ref 26.0–34.0)
MCHC: 32.4 g/dL (ref 30.0–36.0)
MCV: 76.8 fL — ABNORMAL LOW (ref 80.0–100.0)
Monocytes Absolute: 1 10*3/uL (ref 0.1–1.0)
Monocytes Relative: 10 %
Neutro Abs: 6.8 10*3/uL (ref 1.7–7.7)
Neutrophils Relative %: 65 %
Platelets: 297 10*3/uL (ref 150–400)
RBC: 5.55 MIL/uL — ABNORMAL HIGH (ref 3.87–5.11)
RDW: 13.8 % (ref 11.5–15.5)
WBC: 10.4 10*3/uL (ref 4.0–10.5)
nRBC: 0 % (ref 0.0–0.2)

## 2023-03-06 LAB — HEPATIC FUNCTION PANEL
ALT: 20 U/L (ref 0–44)
AST: 21 U/L (ref 15–41)
Albumin: 4.1 g/dL (ref 3.5–5.0)
Alkaline Phosphatase: 88 U/L (ref 38–126)
Bilirubin, Direct: 0.2 mg/dL (ref 0.0–0.2)
Indirect Bilirubin: 0.9 mg/dL (ref 0.3–0.9)
Total Bilirubin: 1.1 mg/dL (ref 0.0–1.2)
Total Protein: 7.9 g/dL (ref 6.5–8.1)

## 2023-03-06 LAB — CBG MONITORING, ED: Glucose-Capillary: 155 mg/dL — ABNORMAL HIGH (ref 70–99)

## 2023-03-06 LAB — LIPASE, BLOOD: Lipase: 24 U/L (ref 11–51)

## 2023-03-06 LAB — HEMOGLOBIN A1C
Hgb A1c MFr Bld: 5.8 % — ABNORMAL HIGH (ref 4.8–5.6)
Mean Plasma Glucose: 119.76 mg/dL

## 2023-03-06 LAB — PREGNANCY, URINE: Preg Test, Ur: NEGATIVE

## 2023-03-06 MED ORDER — HYDROMORPHONE HCL 1 MG/ML IJ SOLN
1.0000 mg | Freq: Once | INTRAMUSCULAR | Status: AC
  Administered 2023-03-06: 1 mg via INTRAVENOUS
  Filled 2023-03-06: qty 1

## 2023-03-06 MED ORDER — SODIUM CHLORIDE 0.9 % IV BOLUS
1000.0000 mL | Freq: Once | INTRAVENOUS | Status: AC
  Administered 2023-03-06: 1000 mL via INTRAVENOUS

## 2023-03-06 MED ORDER — SODIUM CHLORIDE 0.9 % IV SOLN
25.0000 mg | Freq: Once | INTRAVENOUS | Status: AC
  Administered 2023-03-06: 25 mg via INTRAVENOUS
  Filled 2023-03-06: qty 1

## 2023-03-06 MED ORDER — ACETAMINOPHEN 650 MG RE SUPP: 650.0000 mg | Freq: Four times a day (QID) | RECTAL | Status: DC | PRN

## 2023-03-06 MED ORDER — METOCLOPRAMIDE HCL 5 MG/ML IJ SOLN
10.0000 mg | Freq: Four times a day (QID) | INTRAMUSCULAR | Status: DC
  Administered 2023-03-06 – 2023-03-07 (×3): 10 mg via INTRAVENOUS
  Filled 2023-03-06 (×3): qty 2

## 2023-03-06 MED ORDER — INSULIN GLARGINE 100 UNIT/ML ~~LOC~~ SOLN
8.0000 [IU] | Freq: Two times a day (BID) | SUBCUTANEOUS | Status: DC
  Administered 2023-03-07 – 2023-03-08 (×5): 8 [IU] via SUBCUTANEOUS
  Filled 2023-03-06 (×7): qty 0.08

## 2023-03-06 MED ORDER — OXYCODONE HCL 5 MG PO TABS
5.0000 mg | ORAL_TABLET | ORAL | Status: DC | PRN
  Administered 2023-03-07 – 2023-03-09 (×7): 5 mg via ORAL
  Filled 2023-03-06 (×7): qty 1

## 2023-03-06 MED ORDER — DEXTROSE 5 % IN LACTATED RINGERS IV BOLUS
1000.0000 mL | Freq: Once | INTRAVENOUS | Status: AC
  Administered 2023-03-06: 1000 mL via INTRAVENOUS
  Filled 2023-03-06: qty 1000

## 2023-03-06 MED ORDER — KETOROLAC TROMETHAMINE 15 MG/ML IJ SOLN
15.0000 mg | Freq: Once | INTRAMUSCULAR | Status: AC
  Administered 2023-03-06: 15 mg via INTRAVENOUS
  Filled 2023-03-06: qty 1

## 2023-03-06 MED ORDER — INSULIN ASPART 100 UNIT/ML IJ SOLN
0.0000 [IU] | INTRAMUSCULAR | Status: DC
  Administered 2023-03-06: 3 [IU] via SUBCUTANEOUS
  Administered 2023-03-07 – 2023-03-08 (×2): 2 [IU] via SUBCUTANEOUS
  Administered 2023-03-08: 3 [IU] via SUBCUTANEOUS
  Administered 2023-03-09: 2 [IU] via SUBCUTANEOUS
  Filled 2023-03-06 (×5): qty 1

## 2023-03-06 MED ORDER — METOCLOPRAMIDE HCL 5 MG/ML IJ SOLN
10.0000 mg | INTRAMUSCULAR | Status: AC
  Administered 2023-03-06: 10 mg via INTRAVENOUS
  Filled 2023-03-06: qty 2

## 2023-03-06 MED ORDER — ONDANSETRON HCL 4 MG/2ML IJ SOLN
4.0000 mg | Freq: Once | INTRAMUSCULAR | Status: AC
  Administered 2023-03-06: 4 mg via INTRAVENOUS
  Filled 2023-03-06: qty 2

## 2023-03-06 MED ORDER — DEXTROSE 5 % IN LACTATED RINGERS IV BOLUS
1000.0000 mL | Freq: Once | INTRAVENOUS | Status: DC
  Filled 2023-03-06: qty 1000

## 2023-03-06 MED ORDER — LAMOTRIGINE 25 MG PO TABS
100.0000 mg | ORAL_TABLET | Freq: Three times a day (TID) | ORAL | Status: DC
  Administered 2023-03-06 – 2023-03-09 (×8): 100 mg via ORAL
  Filled 2023-03-06 (×8): qty 4

## 2023-03-06 MED ORDER — QUETIAPINE FUMARATE 25 MG PO TABS
50.0000 mg | ORAL_TABLET | Freq: Every day | ORAL | Status: DC
  Administered 2023-03-07 – 2023-03-09 (×3): 50 mg via ORAL
  Filled 2023-03-06 (×3): qty 2

## 2023-03-06 MED ORDER — PROPRANOLOL HCL 20 MG PO TABS
80.0000 mg | ORAL_TABLET | Freq: Three times a day (TID) | ORAL | Status: DC
  Administered 2023-03-06 – 2023-03-09 (×7): 80 mg via ORAL
  Filled 2023-03-06 (×8): qty 4

## 2023-03-06 MED ORDER — SODIUM CHLORIDE 0.9 % IV SOLN: 12.5000 mg | Freq: Four times a day (QID) | INTRAVENOUS | Status: DC | PRN

## 2023-03-06 MED ORDER — LOSARTAN POTASSIUM 25 MG PO TABS
25.0000 mg | ORAL_TABLET | Freq: Every day | ORAL | Status: DC
  Administered 2023-03-07 – 2023-03-09 (×3): 25 mg via ORAL
  Filled 2023-03-06 (×3): qty 1

## 2023-03-06 MED ORDER — DIPHENHYDRAMINE HCL 50 MG/ML IJ SOLN
25.0000 mg | INTRAMUSCULAR | Status: AC
  Administered 2023-03-06: 25 mg via INTRAVENOUS
  Filled 2023-03-06: qty 1

## 2023-03-06 MED ORDER — ACETAMINOPHEN 325 MG PO TABS
650.0000 mg | ORAL_TABLET | Freq: Four times a day (QID) | ORAL | Status: DC | PRN
  Administered 2023-03-08: 650 mg via ORAL
  Filled 2023-03-06: qty 2

## 2023-03-06 MED ORDER — POTASSIUM CHLORIDE 20 MEQ PO PACK
40.0000 meq | PACK | ORAL | Status: AC
Start: 2023-03-06 — End: 2023-03-06
  Administered 2023-03-06: 40 meq via ORAL
  Filled 2023-03-06: qty 2

## 2023-03-06 MED ORDER — QUETIAPINE FUMARATE 300 MG PO TABS
300.0000 mg | ORAL_TABLET | Freq: Every day | ORAL | Status: DC
  Administered 2023-03-06 – 2023-03-08 (×3): 300 mg via ORAL
  Filled 2023-03-06 (×3): qty 1

## 2023-03-06 MED ORDER — PANTOPRAZOLE SODIUM 40 MG PO TBEC
40.0000 mg | DELAYED_RELEASE_TABLET | Freq: Every day | ORAL | Status: DC
  Administered 2023-03-06 – 2023-03-09 (×4): 40 mg via ORAL
  Filled 2023-03-06 (×4): qty 1

## 2023-03-06 MED ORDER — METHIMAZOLE 10 MG PO TABS
20.0000 mg | ORAL_TABLET | Freq: Two times a day (BID) | ORAL | Status: DC
  Administered 2023-03-06 – 2023-03-09 (×6): 20 mg via ORAL
  Filled 2023-03-06 (×6): qty 2

## 2023-03-06 MED ORDER — MORPHINE SULFATE (PF) 4 MG/ML IV SOLN
4.0000 mg | Freq: Once | INTRAVENOUS | Status: AC
  Administered 2023-03-06: 4 mg via INTRAVENOUS
  Filled 2023-03-06: qty 1

## 2023-03-06 MED ORDER — POTASSIUM CHLORIDE CRYS ER 20 MEQ PO TBCR
40.0000 meq | EXTENDED_RELEASE_TABLET | Freq: Once | ORAL | Status: DC
  Filled 2023-03-06: qty 2

## 2023-03-06 MED ORDER — MORPHINE SULFATE (PF) 2 MG/ML IV SOLN
2.0000 mg | INTRAVENOUS | Status: DC | PRN
  Administered 2023-03-06 – 2023-03-09 (×9): 2 mg via INTRAVENOUS
  Filled 2023-03-06 (×9): qty 1

## 2023-03-06 MED ORDER — LABETALOL HCL 5 MG/ML IV SOLN
20.0000 mg | Freq: Once | INTRAVENOUS | Status: AC
  Administered 2023-03-06: 20 mg via INTRAVENOUS
  Filled 2023-03-06: qty 4

## 2023-03-06 NOTE — Assessment & Plan Note (Addendum)
 Was previously diagnosed as type I but ruled out per last endocrinology note Continue insulin coverage with basal and sliding scale. Advance diet as tolerated.  Fasting glucose today is 92

## 2023-03-06 NOTE — Hospital Course (Signed)
 Marland Kitchen

## 2023-03-06 NOTE — ED Provider Notes (Signed)
 [facility] Provider Note    Event Date/Time   First MD Initiated Contact with Patient 03/06/23 1142     (approximate)   History   Chief Complaint: seizure like activity   HPI  Nancy [name] is a 23 y.o. female with a history of diabetes, nonepileptic seizure disorder, cyclic vomiting, who comes to the ED due to seizure-like activity at home.  Mom called EMS.  Takes Lamictal for control of the symptoms.  Patient notes that she has had nausea vomiting for the past week off and on which is interfered with her medication regimen including methimazole for hyperthyroidism.          Physical Exam   Triage Vital Signs: ED Triage Vitals [03/06/23 1150]  Encounter Vitals Group     BP      Systolic BP Percentile      Diastolic BP Percentile      Pulse Rate (!) 107     Resp 16     Temp 99.5 F (37.5 C)     Temp Source Oral     SpO2 100 %     Weight      Height      Head Circumference      Peak Flow      Pain Score      Pain Loc      Pain Education      Exclude from Growth Chart     Most recent vital signs: Vitals:   03/06/23 1330 03/06/23 1400  BP: (!) 158/88 (!) 178/99  Pulse: (!) 109 (!) 104  Resp: (!) 27 (!) 21  Temp:    SpO2: 100% 99%    General: Awake, no distress.  CV:  Good peripheral perfusion.  Tachycardia heart rate 110 Resp:  Normal effort.  Clear to auscultation bilaterally Abd:  No distention.  Soft, mild generalized tenderness, nonfocal Other:  Dry oral mucosa.  Neuro intact.  No intraoral injury/tongue abrasion   ED Results / Procedures / Treatments   Labs (all labs ordered are listed, but only abnormal results are displayed) Labs Reviewed  BASIC METABOLIC PANEL - Abnormal; Notable for the following components:      Result Value   Potassium 3.2 (*)    Glucose, Bld 162 (*)    All other components within normal limits  CBC WITH DIFFERENTIAL/PLATELET - Abnormal; Notable for the following components:   RBC 5.55  (*)    MCV 76.8 (*)    MCH 24.9 (*)    All other components within normal limits  URINALYSIS, W/ REFLEX TO CULTURE (INFECTION SUSPECTED) - Abnormal; Notable for the following components:   Color, Urine YELLOW (*)    APPearance HAZY (*)    Glucose, UA 50 (*)    Ketones, ur 20 (*)    Protein, ur 30 (*)    All other components within normal limits  RESP PANEL BY RT-PCR (RSV, FLU A&B, COVID)  RVPGX2  PREGNANCY, URINE  HEPATIC FUNCTION PANEL  LIPASE, BLOOD     EKG Interpreted by me Sinus tachycardia rate 103.  Normal axis, normal intervals.  Normal QRS ST segments and T waves.   RADIOLOGY    PROCEDURES:  Procedures   MEDICATIONS ORDERED IN ED: Medications  promethazine (PHENERGAN) 25 mg in sodium chloride 0.9 % 50 mL IVPB (has no administration in time range)  labetalol (NORMODYNE) injection 20 mg (has no administration in time range)  HYDROmorphone (DILAUDID) injection 1 mg (has no administration in  time range)  dextrose 5% lactated ringers bolus 1,000 mL (has no administration in time range)  potassium chloride (KLOR-CON) packet 40 mEq (has no administration in time range)  sodium chloride 0.9 % bolus 1,000 mL (0 mLs Intravenous Stopped 03/06/23 1409)  ondansetron (ZOFRAN) injection 4 mg (4 mg Intravenous Given 03/06/23 1204)  ketorolac (TORADOL) 15 MG/ML injection 15 mg (15 mg Intravenous Given 03/06/23 1207)  metoCLOPramide (REGLAN) injection 10 mg (10 mg Intravenous Given 03/06/23 1210)  diphenhydrAMINE (BENADRYL) injection 25 mg (25 mg Intravenous Given 03/06/23 1206)  morphine (PF) 4 MG/ML injection 4 mg (4 mg Intravenous Given 03/06/23 1459)     IMPRESSION / MDM / ASSESSMENT AND PLAN / ED COURSE  I reviewed the triage vital signs and the nursing notes.  DDx: Dehydration, AKI, electrolyte derangement, COVID, influenza, UTI, cyclic vomiting syndrome.  Patient's presentation is most consistent with acute presentation with potential threat to life or bodily  function.  Patient presents with seizure-like episode without altered mental status or tongue abrasion.  Review of chart reveals notations that patient has not had any objectively documented seizure episodes and is not felt to have an epileptic disorder.   ----------------------------------------- 4:22 PM on 03/06/2023 -----------------------------------------  After initial antiemetics, antacids, IV fluids, patient was having some mild improvement in symptoms.  Will give additional fluids, analgesics along with labetalol to help control her tachycardia and hypertension, likely related to her hyperthyroidism.  Not in thyroid storm.  Overall nontoxic.  If additional supportive care does not substantially relieve symptoms and allow her to start tolerating oral intake again, she may need hospitalization for intractable nausea vomiting.       FINAL CLINICAL IMPRESSION(S) / ED DIAGNOSES   Final diagnoses:  Cyclic vomiting syndrome  Hyperthyroidism  Type 2 diabetes mellitus without complication, with long-term current use of insulin (HCC)     Rx / DC Orders   ED Discharge Orders     None        Note:  This document was prepared using Dragon voice recognition software and may include unintentional dictation errors.   Sharman Cheek, MD 03/06/23 [phone]

## 2023-03-06 NOTE — ED Notes (Signed)
 20g PIV rt wrist remains cdi patent no infiltration @ this time infusing IVF without apparent difficulty pt remains aox4 no apparent seizure activity @ this time receiving RN updated pt transported to 148 via stretcher on cardiac pulse ox  monitor with all personal items found in ed room 15 transfer of care complete pt noted to have non labored resps skin appears wdi nad

## 2023-03-06 NOTE — H&P (Signed)
 History and Physical    Patient: Nancy [name] [id number]:[id number] DOB: 2000-09-07 DOA: 03/06/2023 DOS: the patient was seen and examined on 03/06/2023 PCP: Nancy Minks, FNP  Patient coming from: Home  Chief Complaint:  Chief Complaint  Patient presents with   seizure like activity   HPI: Nancy [name] is a 23 y.o. 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  Has been diagnosed with cyclical vomiting in the past .  Was seen a couple days prior on 2/27, in the ED with a complaint of seizures and a fall.  She had a negative trauma workup.  Seizures  were not thought to be true seizures.   Today she again complains of seizure-like activity however since arrival in the ED 6 hours prior she has had no episodes. ED course and data review: Low-grade temp of 99.5 and tachycardic to 107, tachypneic to the low to mid 20s, BP initially elevated,, O2 sats normal on room air Workup mostly unremarkable except for mild hypokalemia of 3.2 and ketones in urine.  CBC, CMP, UA, lipase unremarkable  Patient was hydrated and antiemetics, pain meds and potassium repletion and was also given labetalol for initial elevated blood pressure  She was given trial of clear liquids but started back vomiting.  Admission requested.    Past Medical History:  Diagnosis Date   Acanthosis nigricans    Anxiety    CHF (congestive heart failure) (HCC)    Chronic lower back pain    Depression    DKA, type 1 (HCC) 09/13/2018   Dyspepsia    Obesity    Ovarian cyst    pt is not aware of this hx (11/24/2017)   Precocious adrenarche (HCC)    Premature baby    Seizures (HCC)    Type II diabetes mellitus (HCC)    insulin dependant    Past Surgical History:  Procedure Laterality Date   ABDOMINAL HERNIA REPAIR     "I was a baby"   BIOPSY  10/12/2018   Procedure: BIOPSY;  Surgeon: Nancy Bologna, MD;  Location: [facility] ENDOSCOPY;  Service: Endoscopy;;   BIOPSY  02/28/2020   Procedure: BIOPSY;  Surgeon: Nancy Cleverly, DO;  Location: [facility] ENDOSCOPY;  Service: Gastroenterology;;   ESOPHAGOGASTRODUODENOSCOPY (EGD) WITH PROPOFOL N/A 10/12/2018   Procedure: ESOPHAGOGASTRODUODENOSCOPY (EGD) WITH PROPOFOL;  Surgeon: Nancy Bologna, MD;  Location: [facility] ENDOSCOPY;  Service: Endoscopy;  Laterality: N/A;   ESOPHAGOGASTRODUODENOSCOPY (EGD) WITH PROPOFOL N/A 02/28/2020   Procedure: ESOPHAGOGASTRODUODENOSCOPY (EGD) WITH PROPOFOL;  Surgeon: Nancy Cleverly, DO;  Location: [facility] ENDOSCOPY;  Service: Gastroenterology;  Laterality: N/A;   FLEXIBLE SIGMOIDOSCOPY N/A 02/28/2020   Procedure: FLEXIBLE SIGMOIDOSCOPY;  Surgeon: Nancy Cleverly, DO;  Location: [facility] ENDOSCOPY;  Service: Gastroenterology;  Laterality: N/A;   HERNIA REPAIR     LEFT HEART CATH AND CORONARY ANGIOGRAPHY N/A 10/13/2018   Procedure: LEFT HEART CATH AND CORONARY ANGIOGRAPHY;  Surgeon: Nancy Delvina Mizzell, MD;  Location: [facility] INVASIVE CV LAB;  Service: Cardiovascular;  Laterality: N/A;   TONSILLECTOMY AND ADENOIDECTOMY     WISDOM TOOTH EXTRACTION  2017   Social History:  reports that she has been smoking e-cigarettes. She has never used smokeless tobacco. She reports that she does not drink alcohol and does not use drugs.  Allergies  Allergen Reactions   Oatmeal Anaphylaxis   Tomato Anaphylaxis   Acetaminophen Other (See Comments)    Avoids due to liver   Ibuprofen Other (See Comments)    GI MD said to not take this anymore    Family History  Problem Relation Age of Onset   Diabetes Mother    Hypertension Mother    Obesity Mother    Asthma Mother    Allergic rhinitis Mother    Eczema Mother    Cervical cancer Mother    Diabetes Father    Hypertension Father     Obesity Father    Hyperlipidemia Father    Hypertension Paternal Aunt    Hypertension Maternal Grandfather    Colon cancer Maternal Grandfather    Diabetes Paternal Grandmother    Obesity Paternal Grandmother    Diabetes Paternal Grandfather    Obesity Paternal Grandfather    Angioedema Neg Hx    Immunodeficiency Neg Hx    Urticaria Neg Hx    Stomach cancer Neg Hx    Esophageal cancer Neg Hx     Prior to Admission medications   Medication Sig Start Date End Date Taking? Authorizing Provider  esomeprazole (NEXIUM) 20 MG capsule Take 1 capsule (20 mg total) by mouth daily. 07/28/22 07/28/23  Nancy Courser, MD  furosemide (LASIX) 20 MG tablet Take 20 mg by mouth daily as needed for fluid or edema.    [provider]  Glucagon (BAQSIMI TWO PACK) 3 MG/DOSE POWD Place 1 spray into the nose as needed. 08/23/21   [name], Nancy Curtis, MD  insulin glargine (LANTUS) 100 unit/mL SOPN Inject 20 Units into the skin 2 (two) times daily. Patient taking differently: Inject 60 Units into the skin 2 (two) times daily. 08/23/21   [name], Nancy Curtis, MD  lamoTRIgine (LAMICTAL) 100 MG tablet Take 1 tablet (100 mg total) by mouth in the morning, at [name], and at bedtime. 10/05/21   Nancy Priestly, MD  lidocaine (LIDODERM) 5 % Place 1 patch onto the skin every 12 (twelve) hours. Remove & Discard patch within 12 hours or as directed by MD 02/03/23 02/03/24  Nancy Noon, MD  loperamide (IMODIUM) 2 MG capsule Take 2 capsules (4 mg total) by mouth as needed for diarrhea or loose stools. 08/23/21   [name], Nancy Curtis, MD  losartan (COZAAR) 25 MG tablet Take 1 tablet (25 mg total) by mouth daily. 08/23/21   [name], Nancy Curtis, MD  lubiprostone (AMITIZA) 8 MCG capsule Take 8 mcg by mouth 2 (two) times daily with a meal.    [provider]  methimazole (TAPAZOLE) 10 MG tablet Take 2 tablets (20 mg total) by mouth 2 (two) times daily. 10/05/21   Nancy Priestly, MD  ondansetron (ZOFRAN) 4 MG tablet Take 1 tablet (4 mg  total) by mouth daily as needed for nausea or vomiting. 10/10/22 10/10/23  Nancy Jarvis, MD  propranolol (INDERAL) 80 MG tablet Take 1 tablet (80 mg total) by mouth 3 (three) times daily. 10/05/21   Nancy Priestly, MD  QUEtiapine (SEROQUEL) 100 MG tablet Take 300 mg by mouth at bedtime.    [provider]  QUEtiapine (SEROQUEL) 50 MG tablet Take 1 tablet (50 mg total) by mouth daily. 01/27/22   Lurene Shadow, MD  prochlorperazine (COMPAZINE) 25 MG suppository PLACE 1 SUPPOSITORY (25 MG TOTAL) RECTALLY EVERY TWELVE HOURS AS NEEDED FOR NAUSEA OR VOMITING. 02/29/20 02/29/20  Maretta Bees, MD    Physical Exam: Vitals:   03/06/23 1915 03/06/23 1930 03/06/23  2000 03/06/23 2030  BP:  116/71 127/74 (!) 109/59  Pulse: (!) 105 100 (!) 102 (!) 102  Resp: (!) 22 (!) 26 (!) 24 15  Temp:    98.2 F (36.8 C)  TempSrc:    Oral  SpO2: 100% 100% 100% 100%  Weight:      Height:       Physical Exam Vitals and nursing note reviewed.  Constitutional:      General: She is not in acute distress. HENT:     Head: Normocephalic and atraumatic.  Cardiovascular:     Rate and Rhythm: Normal rate and regular rhythm.     Heart sounds: Normal heart sounds.  Pulmonary:     Effort: Pulmonary effort is normal.     Breath sounds: Normal breath sounds.  Abdominal:     Palpations: Abdomen is soft.     Tenderness: There is no abdominal tenderness.  Neurological:     Mental Status: Mental status is at baseline.     Data Reviewed: Relevant notes from primary care and specialist visits, past discharge summaries as available in EHR, including Care Everywhere. Prior diagnostic testing as pertinent to current admission diagnoses Updated medications and problem lists for reconciliation ED course, including vitals, labs, imaging, treatment and response to treatment Triage notes, nursing and pharmacy notes and ED provider's notes Notable results as noted in HPI   Assessment and Plan: * Intractable vomiting with  nausea Recurrent times years, etiology uncertain-had an essentially negative EGD 22 Ice chips and advance to clear liquid diet as tolerated Antiemetics, pain control  Seizure-like activity (HCC) History of psychogenic antiepileptic seizures Was seen in the ED 2 days prior for seizure-like activity deemed to be nonepileptic Patient has had negative EEGs in the past No similar activity since arrival She is nonetheless maintained on Lamictal which we will continue  Hypertensive urgency Improved with labetalol x 1 in the ED Continue home losartan with small sips Hydralazine as needed IV for additional control  Hypokalemia Received IV repletion in the ED Monitor and correct as needed  Hyperthyroidism 03/05/2023: Elevated free T43.22 And undetectable TSH<0.01 Continue methimazole and propranolol Patient is followed by endocrinology  Insulin dependent type 2 diabetes mellitus (HCC) Was previously diagnosed as type I but ruled out per last endocrinology note Continue glargine Sliding scale insulin coverage      Advance Care Planning:   Code Status: Full Code   Consults: none  Family Communication: none  Severity of Illness: The appropriate patient status for this patient is OBSERVATION. Observation status is judged to be reasonable and necessary in order to provide the required intensity of service to ensure the patient's safety. The patient's presenting symptoms, physical exam findings, and initial radiographic and laboratory data in the context of their medical condition is felt to place them at decreased risk for further clinical deterioration. Furthermore, it is anticipated that the patient will be medically stable for discharge from the hospital within 2 midnights of admission.   Author: Andris Baumann, MD 03/06/2023 9:12 PM  For on call review [email or web address].

## 2023-03-06 NOTE — Assessment & Plan Note (Addendum)
 History of psychogenic antiepileptic seizures Currently with no active seizures. Continue close monitoring.   Patient on lamotrigine.   Depression, continue with quetiapine.

## 2023-03-06 NOTE — Assessment & Plan Note (Addendum)
 Continue with losartan and propranolol.

## 2023-03-06 NOTE — Assessment & Plan Note (Deleted)
 Continue glargine Sliding scale insulin coverage

## 2023-03-06 NOTE — ED Notes (Addendum)
 Pt reports unresolved N/V Admitting service notified

## 2023-03-06 NOTE — Assessment & Plan Note (Addendum)
 Hypernatremia and hypokalemia.   Today renal function with serum cr at 0,56 with K at 3,5 and serum bicarbonate at 24  Na 139 and Mg 2.0   Add 40 meq Kcl po to prevent hypokalemia.  Continue with regular diet as tolerated.

## 2023-03-06 NOTE — ED Triage Notes (Signed)
 Pt arrives via [facility] with c/o seizure like activity. Pt was seen here for the same yesterday. Pt c/o pain in their abdomen. Pt denies missing any of their doses of seizure medicines. Pt states that normally when they are feeling like this their thyroid is out of whack. Pt is A&Ox4.

## 2023-03-06 NOTE — Assessment & Plan Note (Addendum)
 Symptoms have improved, prior hospitalization she tested positive for cannabinoids.   Plan to advance diet to regular and continue monitoring.  Continue with as needed antiemetics and scheduled antiacids.  Out of bed to chair tid with meals.   Continue pain control with analgesics.

## 2023-03-06 NOTE — Assessment & Plan Note (Addendum)
 Continue with methimazole and propranolol, currently she is asymptomatic.

## 2023-03-06 NOTE — ED Provider Notes (Signed)
-----------------------------------------   7:32 PM on 03/06/2023 -----------------------------------------  I took over care of this patient from Dr. Scotty Court.  The patient had been improving and then as he was better.  She tried to eat and drink.  However she started having vomiting again.  She will need admission for further management for intractable vomiting.  I consulted Dr. Para March from the hospitalist service; based on our discussion she agrees to evaluate the patient for admission.   Dionne Bucy, MD 03/06/23 [phone]

## 2023-03-07 DIAGNOSIS — E0590 Thyrotoxicosis, unspecified without thyrotoxic crisis or storm: Secondary | ICD-10-CM

## 2023-03-07 DIAGNOSIS — I160 Hypertensive urgency: Secondary | ICD-10-CM

## 2023-03-07 DIAGNOSIS — E66811 Obesity, class 1: Secondary | ICD-10-CM

## 2023-03-07 DIAGNOSIS — E876 Hypokalemia: Secondary | ICD-10-CM | POA: Diagnosis not present

## 2023-03-07 DIAGNOSIS — Z794 Long term (current) use of insulin: Secondary | ICD-10-CM

## 2023-03-07 DIAGNOSIS — R569 Unspecified convulsions: Secondary | ICD-10-CM

## 2023-03-07 DIAGNOSIS — E119 Type 2 diabetes mellitus without complications: Secondary | ICD-10-CM

## 2023-03-07 DIAGNOSIS — R112 Nausea with vomiting, unspecified: Secondary | ICD-10-CM | POA: Diagnosis not present

## 2023-03-07 LAB — CBC
HCT: 32.6 % — ABNORMAL LOW (ref 36.0–46.0)
Hemoglobin: 10.4 g/dL — ABNORMAL LOW (ref 12.0–15.0)
MCH: 24.8 pg — ABNORMAL LOW (ref 26.0–34.0)
MCHC: 31.9 g/dL (ref 30.0–36.0)
MCV: 77.8 fL — ABNORMAL LOW (ref 80.0–100.0)
Platelets: 243 10*3/uL (ref 150–400)
RBC: 4.19 MIL/uL (ref 3.87–5.11)
RDW: 14.3 % (ref 11.5–15.5)
WBC: 8.2 10*3/uL (ref 4.0–10.5)
nRBC: 0 % (ref 0.0–0.2)

## 2023-03-07 LAB — GLUCOSE, CAPILLARY
Glucose-Capillary: 110 mg/dL — ABNORMAL HIGH (ref 70–99)
Glucose-Capillary: 111 mg/dL — ABNORMAL HIGH (ref 70–99)
Glucose-Capillary: 113 mg/dL — ABNORMAL HIGH (ref 70–99)
Glucose-Capillary: 118 mg/dL — ABNORMAL HIGH (ref 70–99)
Glucose-Capillary: 118 mg/dL — ABNORMAL HIGH (ref 70–99)
Glucose-Capillary: 149 mg/dL — ABNORMAL HIGH (ref 70–99)
Glucose-Capillary: 74 mg/dL (ref 70–99)

## 2023-03-07 LAB — BASIC METABOLIC PANEL
Anion gap: 3 — ABNORMAL LOW (ref 5–15)
BUN: 14 mg/dL (ref 6–20)
CO2: 23 mmol/L (ref 22–32)
Calcium: 8.4 mg/dL — ABNORMAL LOW (ref 8.9–10.3)
Chloride: 120 mmol/L — ABNORMAL HIGH (ref 98–111)
Creatinine, Ser: 0.55 mg/dL (ref 0.44–1.00)
GFR, Estimated: >60 mL/min (ref >60)
Glucose, Bld: 92 mg/dL (ref 70–99)
Potassium: 3.8 mmol/L (ref 3.5–5.1)
Sodium: 146 mmol/L — ABNORMAL HIGH (ref 135–145)

## 2023-03-07 MED ORDER — METOCLOPRAMIDE HCL 5 MG/ML IJ SOLN: 10.0000 mg | Freq: Four times a day (QID) | INTRAMUSCULAR | Status: DC | PRN

## 2023-03-07 NOTE — Plan of Care (Signed)
   Problem: Education: Goal: Ability to describe self-care measures that may prevent or decrease complications (Diabetes Survival Skills Education) will improve Outcome: Progressing Goal: Individualized Educational Video(s) Outcome: Progressing   Problem: Coping: Goal: Ability to adjust to condition or change in health will improve Outcome: Progressing   Problem: Fluid Volume: Goal: Ability to maintain a balanced intake and output will improve Outcome: Progressing   Problem: Health Behavior/Discharge Planning: Goal: Ability to identify and utilize available resources and services will improve Outcome: Progressing Goal: Ability to manage health-related needs will improve Outcome: Progressing   Problem: Metabolic: Goal: Ability to maintain appropriate glucose levels will improve Outcome: Progressing   Problem: Skin Integrity: Goal: Risk for impaired skin integrity will decrease Outcome: Progressing

## 2023-03-07 NOTE — Progress Notes (Signed)
  Progress Note   Patient: Nancy [name] ZOX:[id number] DOB: 15-Mar-2000 DOA: 03/06/2023     0 DOS: the patient was seen and examined on 03/07/2023   Brief hospital course: Nancy [name] was admitted to the hospital with the working diagnosis of intractable nausea and vomiting.   23 yo female with the past medical history of T2DM, iron deficiency anemia, heart failure, depression, polysubstance abuse, psychogenic seizures, and hypothyroidism, who presented with nausea and vomiting, not able to tolerate po intake. Reported seizure like activity.  On her initial physical examination her blood pressure was 116/71, HR 102, RR 24 and 02 saturation 100%, lungs with no wheezing or rhonchi, heart with S1 and S2 present and regular with no gallops, rubs or murmurs, abdomen with no distention and no lower extremity edema.   Assessment and Plan: * Intractable vomiting with nausea Symptoms have improved, prior hospitalization she tested positive for cannabinoids.   Plan to advance diet to regular and continue monitoring.  Continue with as needed antiemetics and scheduled antiacids.  Out of bed to chair tid with meals.   Continue pain control with analgesics.   Seizure-like activity (HCC) History of psychogenic antiepileptic seizures Currently with no active seizures. Continue close monitoring.   Patient on lamotrigine.   Depression, continue with quetiapine.   Hypertensive urgency Blood pressure has improved.  Continue with losartan and propranolol.   Hypokalemia Hypernatremia and hypokalemia.   Renal function with serum cr at 0,5 with K at 3,8 and serum bicarbonate at 23  Na 146   Plan to continue close monitoring of electrolytes, add 40 meq Kcl po to prevent hypokalemia.  Advance diet to regular.   Hyperthyroidism Continue with methimazole and propranolol, currently she is asymptomatic.   Insulin dependent type 2 diabetes mellitus (HCC) Was previously diagnosed as type I but ruled out  per last endocrinology note Continue insulin coverage with basal and sliding scale. Advance diet as tolerated.  Fasting glucose today is 92   Obesity, class 1 Calculated BMI is 32.7        Subjective: Patient is feeling better, nausea and vomiting are improving, she has been able to tolerate clear liquids   Physical Exam: Vitals:   03/06/23 2000 03/06/23 2030 03/07/23 0737 03/07/23 1002  BP: 127/74 (!) 109/59 (!) 115/57 121/63  Pulse: (!) 102 (!) 102 92 81  Resp: (!) 24 15 16 16   Temp:  98.2 F (36.8 C)    TempSrc:  Oral    SpO2: 100% 100% 100%   Weight:      Height:       Neurology awake and alert ENT with no pallor Cardiovascular with S1 and S2 present and regular with no gallops, rubs or murmurs Respiratory with no rales or wheezing Abdomen with mild tenderness to palpation, with no guarding or rebound, no distention  Data Reviewed:    Family Communication: no family at the bedside   Disposition: Status is: Observation The patient remains OBS appropriate and will d/c before 2 midnights.  Planned Discharge Destination: Home      Author: Coralie Keens, MD 03/07/2023 10:17 AM  For on call review [email or web address].

## 2023-03-07 NOTE — Hospital Course (Signed)
 Mrs, Nancy [name] was admitted to the hospital with the working diagnosis of intractable nausea and vomiting.   23 yo female with the past medical history of T2DM, iron deficiency anemia, heart failure, depression, polysubstance abuse, psychogenic seizures, and hypothyroidism, who presented with nausea and vomiting, not able to tolerate po intake. Reported seizure like activity.  On her initial physical examination her blood pressure was 116/71, HR 102, RR 24 and 02 saturation 100%, lungs with no wheezing or rhonchi, heart with S1 and S2 present and regular with no gallops, rubs or murmurs, abdomen with no distention and no lower extremity edema.

## 2023-03-07 NOTE — Assessment & Plan Note (Signed)
Calculated BMI is 32.7  

## 2023-03-07 NOTE — Progress Notes (Signed)
   03/06/23 2030  Vitals  Temp 98.2 F (36.8 C)  Temp Source Oral  BP (!) 109/59  MAP (mmHg) 71  BP Location Left Arm  BP Method Automatic  Patient Position (if appropriate) Sitting  Pulse Rate (!) 102  Pulse Rate Source Dinamap  Resp 15  Level of Consciousness  Level of Consciousness Alert  MEWS COLOR  MEWS Score Color Green  Oxygen Therapy  SpO2 100 %  O2 Device Room Air  MEWS Score  MEWS Temp 0  MEWS Systolic 0  MEWS Pulse 1  MEWS RR 0  MEWS LOC 0  MEWS Score 1   Pt alert and oriented, new admit to unit. Oriented to surroundings, fall and seizure precautions. Pt given prn pain medication for reports of "soreness" to abdomen. Will monitor for effectiveness. Pt educated on call light system and instructed to call if needed.

## 2023-03-08 DIAGNOSIS — R569 Unspecified convulsions: Secondary | ICD-10-CM | POA: Diagnosis not present

## 2023-03-08 DIAGNOSIS — R112 Nausea with vomiting, unspecified: Secondary | ICD-10-CM | POA: Diagnosis present

## 2023-03-08 DIAGNOSIS — Z794 Long term (current) use of insulin: Secondary | ICD-10-CM | POA: Diagnosis not present

## 2023-03-08 DIAGNOSIS — R109 Unspecified abdominal pain: Secondary | ICD-10-CM | POA: Diagnosis present

## 2023-03-08 DIAGNOSIS — F1910 Other psychoactive substance abuse, uncomplicated: Secondary | ICD-10-CM | POA: Diagnosis present

## 2023-03-08 DIAGNOSIS — E66811 Obesity, class 1: Secondary | ICD-10-CM | POA: Diagnosis present

## 2023-03-08 DIAGNOSIS — Z833 Family history of diabetes mellitus: Secondary | ICD-10-CM | POA: Diagnosis not present

## 2023-03-08 DIAGNOSIS — I160 Hypertensive urgency: Secondary | ICD-10-CM | POA: Diagnosis present

## 2023-03-08 DIAGNOSIS — Z825 Family history of asthma and other chronic lower respiratory diseases: Secondary | ICD-10-CM | POA: Diagnosis not present

## 2023-03-08 DIAGNOSIS — F17290 Nicotine dependence, other tobacco product, uncomplicated: Secondary | ICD-10-CM | POA: Diagnosis present

## 2023-03-08 DIAGNOSIS — Z6832 Body mass index (BMI) 32.0-32.9, adult: Secondary | ICD-10-CM | POA: Diagnosis not present

## 2023-03-08 DIAGNOSIS — R1115 Cyclical vomiting syndrome unrelated to migraine: Secondary | ICD-10-CM | POA: Diagnosis present

## 2023-03-08 DIAGNOSIS — Z8249 Family history of ischemic heart disease and other diseases of the circulatory system: Secondary | ICD-10-CM | POA: Diagnosis not present

## 2023-03-08 DIAGNOSIS — F1210 Cannabis abuse, uncomplicated: Secondary | ICD-10-CM | POA: Diagnosis present

## 2023-03-08 DIAGNOSIS — Z8049 Family history of malignant neoplasm of other genital organs: Secondary | ICD-10-CM | POA: Diagnosis not present

## 2023-03-08 DIAGNOSIS — E119 Type 2 diabetes mellitus without complications: Secondary | ICD-10-CM | POA: Diagnosis present

## 2023-03-08 DIAGNOSIS — E876 Hypokalemia: Secondary | ICD-10-CM | POA: Diagnosis present

## 2023-03-08 DIAGNOSIS — D509 Iron deficiency anemia, unspecified: Secondary | ICD-10-CM | POA: Diagnosis present

## 2023-03-08 DIAGNOSIS — E0590 Thyrotoxicosis, unspecified without thyrotoxic crisis or storm: Secondary | ICD-10-CM | POA: Diagnosis present

## 2023-03-08 DIAGNOSIS — Z83438 Family history of other disorder of lipoprotein metabolism and other lipidemia: Secondary | ICD-10-CM | POA: Diagnosis not present

## 2023-03-08 DIAGNOSIS — F32A Depression, unspecified: Secondary | ICD-10-CM | POA: Diagnosis present

## 2023-03-08 DIAGNOSIS — Z1152 Encounter for screening for COVID-19: Secondary | ICD-10-CM | POA: Diagnosis not present

## 2023-03-08 DIAGNOSIS — E870 Hyperosmolality and hypernatremia: Secondary | ICD-10-CM | POA: Diagnosis not present

## 2023-03-08 DIAGNOSIS — I5032 Chronic diastolic (congestive) heart failure: Secondary | ICD-10-CM | POA: Diagnosis present

## 2023-03-08 DIAGNOSIS — Z800 Family history of malignant neoplasm of digestive organs: Secondary | ICD-10-CM | POA: Diagnosis not present

## 2023-03-08 LAB — GLUCOSE, CAPILLARY
Glucose-Capillary: 100 mg/dL — ABNORMAL HIGH (ref 70–99)
Glucose-Capillary: 104 mg/dL — ABNORMAL HIGH (ref 70–99)
Glucose-Capillary: 107 mg/dL — ABNORMAL HIGH (ref 70–99)
Glucose-Capillary: 144 mg/dL — ABNORMAL HIGH (ref 70–99)
Glucose-Capillary: 170 mg/dL — ABNORMAL HIGH (ref 70–99)
Glucose-Capillary: 96 mg/dL (ref 70–99)

## 2023-03-08 LAB — BASIC METABOLIC PANEL
Anion gap: 5 (ref 5–15)
BUN: 17 mg/dL (ref 6–20)
CO2: 24 mmol/L (ref 22–32)
Calcium: 8.6 mg/dL — ABNORMAL LOW (ref 8.9–10.3)
Chloride: 110 mmol/L (ref 98–111)
Creatinine, Ser: 0.56 mg/dL (ref 0.44–1.00)
GFR, Estimated: >60 mL/min (ref >60)
Glucose, Bld: 85 mg/dL (ref 70–99)
Potassium: 3.5 mmol/L (ref 3.5–5.1)
Sodium: 139 mmol/L (ref 135–145)

## 2023-03-08 LAB — MAGNESIUM: Magnesium: 2 mg/dL (ref 1.7–2.4)

## 2023-03-08 MED ORDER — POTASSIUM CHLORIDE CRYS ER 20 MEQ PO TBCR
40.0000 meq | EXTENDED_RELEASE_TABLET | Freq: Once | ORAL | Status: AC
  Administered 2023-03-08: 40 meq via ORAL
  Filled 2023-03-08: qty 2

## 2023-03-08 MED ORDER — SUCRALFATE 1 GM/10ML PO SUSP
1.0000 g | Freq: Three times a day (TID) | ORAL | Status: DC
Start: 2023-03-08 — End: 2023-03-09
  Administered 2023-03-08 – 2023-03-09 (×3): 1 g via ORAL
  Filled 2023-03-08 (×4): qty 10

## 2023-03-08 NOTE — Progress Notes (Signed)
  Progress Note   Patient: Nancy [name] VWU:[id number] DOB: August 01, 2000 DOA: 03/06/2023     0 DOS: the patient was seen and examined on 03/08/2023   Brief hospital course: Nancy [name] was admitted to the hospital with the working diagnosis of intractable nausea and vomiting.   23 yo female with the past medical history of T2DM, iron deficiency anemia, heart failure, depression, polysubstance abuse, psychogenic seizures, and hypothyroidism, who presented with nausea and vomiting, not able to tolerate po intake. Reported seizure like activity.  On her initial physical examination her blood pressure was 116/71, HR 102, RR 24 and 02 saturation 100%, lungs with no wheezing or rhonchi, heart with S1 and S2 present and regular with no gallops, rubs or murmurs, abdomen with no distention and no lower extremity edema.   Assessment and Plan: * Intractable vomiting with nausea Symptoms have improved, prior hospitalization she tested positive for cannabinoids.   Plan to advance diet to regular and continue monitoring.  Continue with as needed antiemetics and scheduled antiacids.  Out of bed to chair tid with meals.   Continue pain control with analgesics.   Seizure-like activity (HCC) History of psychogenic antiepileptic seizures Currently with no active seizures. Continue close monitoring.   Patient on lamotrigine.   Depression, continue with quetiapine.   Hypertensive urgency Blood pressure has improved.  Continue with losartan and propranolol.   Hypokalemia Hypernatremia and hypokalemia.   Renal function with serum cr at 0,5 with K at 3,8 and serum bicarbonate at 23  Na 146   Plan to continue close monitoring of electrolytes, add 40 meq Kcl po to prevent hypokalemia.  Advance diet to regular.   Hyperthyroidism Continue with methimazole and propranolol, currently she is asymptomatic.   Insulin dependent type 2 diabetes mellitus (HCC) Was previously diagnosed as type I but ruled out  per last endocrinology note Continue insulin coverage with basal and sliding scale. Advance diet as tolerated.  Fasting glucose today is 92   Obesity, class 1 Calculated BMI is 32.7         Subjective: Patient today with abdominal pain, burning in nature, positive diarrhea, and poor appetite   Physical Exam: Vitals:   03/07/23 2104 03/07/23 2130 03/07/23 2339 03/08/23 0759  BP: 132/81 111/68 (!) 96/50 (!) 113/57  Pulse: 98 97 99 93  Resp: 17  17 19   Temp: 98.9 F (37.2 C)  98.1 F (36.7 C) 98.9 F (37.2 C)  TempSrc: Oral  Oral   SpO2: 99%  100% 100%  Weight:      Height:       Neurology awake and alert ENT with mild pallor Cardiovascular with S1 and S2 present and regular Respiratory with no rales or wheezing Abdomen with no distention  No lower extremity edema  Data Reviewed:    Family Communication: no family at the bedside   Disposition: Status is: Observation The patient will require care spanning > 2 midnights and should be moved to inpatient because:   Planned Discharge Destination: Home     Author: Coralie Keens, MD 03/08/2023 3:01 PM  For on call review [email or web address].

## 2023-03-08 NOTE — Plan of Care (Signed)

## 2023-03-08 NOTE — Plan of Care (Signed)
   Problem: Coping: Goal: Ability to adjust to condition or change in health will improve Outcome: Progressing   Problem: Fluid Volume: Goal: Ability to maintain a balanced intake and output will improve Outcome: Progressing

## 2023-03-08 NOTE — Progress Notes (Signed)
 Gm! Pt has reported 9/10 diffuse abdominal pain, describes as sharp and "different" from what she has been experiencing. Pt reports that it began earlier yesterday evening prior to dinner. Pt has requested and consumed multiple foods throughout shift despite reports of consistent pain. Received oxycodone x2, morphine IV x3 this shift with little effects. Steward Drone NP notified, however no new orders noted. I have encouraged pt to limit foods due to pain, however pt continues to request multiple items. Secure message sent to Dr. Ella Jubilee for update and further intervention if needed.

## 2023-03-09 DIAGNOSIS — R569 Unspecified convulsions: Secondary | ICD-10-CM | POA: Diagnosis not present

## 2023-03-09 DIAGNOSIS — R112 Nausea with vomiting, unspecified: Secondary | ICD-10-CM | POA: Diagnosis not present

## 2023-03-09 DIAGNOSIS — E876 Hypokalemia: Secondary | ICD-10-CM | POA: Diagnosis not present

## 2023-03-09 DIAGNOSIS — I160 Hypertensive urgency: Secondary | ICD-10-CM | POA: Diagnosis not present

## 2023-03-09 LAB — BASIC METABOLIC PANEL
Anion gap: 5 (ref 5–15)
BUN: 18 mg/dL (ref 6–20)
CO2: 26 mmol/L (ref 22–32)
Calcium: 9 mg/dL (ref 8.9–10.3)
Chloride: 107 mmol/L (ref 98–111)
Creatinine, Ser: 0.5 mg/dL (ref 0.44–1.00)
GFR, Estimated: >60 mL/min (ref >60)
Glucose, Bld: 112 mg/dL — ABNORMAL HIGH (ref 70–99)
Potassium: 4 mmol/L (ref 3.5–5.1)
Sodium: 138 mmol/L (ref 135–145)

## 2023-03-09 LAB — GLUCOSE, CAPILLARY
Glucose-Capillary: 141 mg/dL — ABNORMAL HIGH (ref 70–99)
Glucose-Capillary: 104 mg/dL — ABNORMAL HIGH (ref 70–99)
Glucose-Capillary: 112 mg/dL — ABNORMAL HIGH (ref 70–99)

## 2023-03-09 NOTE — Plan of Care (Signed)

## 2023-03-10 NOTE — Discharge Summary (Signed)
 Physician Discharge Summary   Patient: Nancy [name] MRN: [id number] DOB: 2000/11/16  Admit date:     03/06/2023  Discharge date: 03/09/2023  Discharge Physician: Marcelino Duster   PCP: Lucienne Minks, FNP   Recommendations at discharge:    PCP follow up in 1 week. Pain clinic follow up.  Discharge Diagnoses: Principal Problem:   Intractable vomiting with nausea Active Problems:   Seizure-like activity (HCC)   Hypertensive urgency   Hypokalemia   Hyperthyroidism   Insulin dependent type 2 diabetes mellitus (HCC)   Obesity, class 1   Abdominal pain  Resolved Problems:   * No resolved hospital problems. *  Hospital Course: [name] is a 23 yo female with the past medical history of T2DM, iron deficiency anemia, heart failure, depression, polysubstance abuse, psychogenic seizures, and hypothyroidism, who presented with nausea and vomiting, not able to tolerate po intake. Reported seizure like activity.    Assessment and Plan: * Intractable vomiting with nausea Prior hospitalization she tested positive for cannabinoids.  She required IV morphine for pain control later changed to oral opiates. She got IV antiemetics, sucralfate.  Her pain, N/V improved, able to tolerate diet. Advised to follow up with PCP. Encouraged to quit marijuana. She understands and agrees with discharge plan.  Seizure-like activity (HCC) History of psychogenic antiepileptic seizures Currently with no active seizures. Continue lamotrigine.   Depression, continue with quetiapine.   Hypertensive urgency Continue with losartan and propranolol.   Hypokalemia Resolved with repletion   Hyperthyroidism Continue home dose methimazole and propranolol.  Insulin dependent type 2 diabetes mellitus (HCC) Home insulin regimen restarted upon discharge. Advised to follow with PCP.  Obesity, class 1 Calculated BMI is 32.7  Diet, exercise and weight reduction advised.       Consultants:  none Procedures performed: none  Disposition: Home Diet recommendation:  Discharge Diet Orders (From admission, onward)     Start     Ordered   03/09/23 0000  Diet - low sodium heart healthy        03/09/23 1417           Cardiac and Carb modified diet DISCHARGE MEDICATION: Allergies as of 03/09/2023       Reactions   Oatmeal Anaphylaxis   Tomato Anaphylaxis   Acetaminophen Other (See Comments)   Avoids due to liver   Ibuprofen Other (See Comments)   GI MD said to not take this anymore        Medication List     TAKE these medications    Baqsimi Two Pack 3 MG/DOSE Powd Generic drug: Glucagon Place 1 spray into the nose as needed.   esomeprazole 20 MG capsule Commonly known as: NexIUM Take 1 capsule (20 mg total) by mouth daily.   furosemide 20 MG tablet Commonly known as: LASIX Take 20 mg by mouth daily as needed for fluid or edema.   insulin glargine 100 unit/mL Sopn Commonly known as: LANTUS Inject 20 Units into the skin 2 (two) times daily. What changed: how much to take   lamoTRIgine 100 MG tablet Commonly known as: LAMICTAL Take 1 tablet (100 mg total) by mouth in the morning, at noon, and at bedtime.   lidocaine 5 % Commonly known as: Lidoderm Place 1 patch onto the skin every 12 (twelve) hours. Remove & Discard patch within 12 hours or as directed by MD   loperamide 2 MG capsule Commonly known as: IMODIUM Take 2 capsules (4 mg total) by mouth as needed for diarrhea or  loose stools.   losartan 25 MG tablet Commonly known as: COZAAR Take 1 tablet (25 mg total) by mouth daily.   lubiprostone 8 MCG capsule Commonly known as: AMITIZA Take 8 mcg by mouth 2 (two) times daily with a meal.   methimazole 10 MG tablet Commonly known as: TAPAZOLE Take 2 tablets (20 mg total) by mouth 2 (two) times daily.   ondansetron 4 MG tablet Commonly known as: Zofran Take 1 tablet (4 mg total) by mouth daily as needed for nausea or vomiting.   propranolol  80 MG tablet Commonly known as: INDERAL Take 1 tablet (80 mg total) by mouth 3 (three) times daily.   QUEtiapine 100 MG tablet Commonly known as: SEROQUEL Take 300 mg by mouth at bedtime.   QUEtiapine 50 MG tablet Commonly known as: SEROQUEL Take 1 tablet (50 mg total) by mouth daily.        Follow-up Information     Lucienne Minks, FNP .   Specialty: Family Medicine Contact information: [address] [facility] [address] [place] [address] [phone]                Discharge Exam: Filed Weights   03/06/23 1153  Weight: 83.8 kg      03/09/2023    7:43 AM 03/08/2023    8:23 PM 03/08/2023    4:19 PM  Vitals with BMI  Systolic 123 146 756  Diastolic 80 73 83  Pulse 88 95 86    General - Young  African American female, no apparent distress HEENT - PERRLA, EOMI, atraumatic head, non tender sinuses. Lung - Clear, no rales, rhonchi, wheezes. Heart - S1, S2 heard, no murmurs, rubs, trace pedal edema. Abdomen - Soft, non tender, obese, bowel sounds good Neuro - Alert, awake and oriented x 3, non focal exam. Skin - Warm and dry. Shaking in pain per patient.  Condition at discharge: stable  The results of significant diagnostics from this hospitalization (including imaging, microbiology, ancillary and laboratory) are listed below for reference.   Imaging Studies: MR BRAIN WO CONTRAST Result Date: 03/05/2023 CLINICAL DATA:  Follow-up examination for stroke. EXAM: MRI HEAD WITHOUT CONTRAST TECHNIQUE: Multiplanar, multiecho pulse sequences of the brain and surrounding structures were obtained without intravenous contrast. COMPARISON:  CT from earlier the same day. FINDINGS: Brain: Cerebral volume within normal limits for age. No focal parenchymal signal abnormality. No abnormal foci of restricted diffusion to suggest acute or subacute ischemia. Gray-white matter differentiation well maintained. No encephalomalacia to suggest chronic cortical infarction or other insult. No  foci of susceptibility artifact indicative of acute or chronic intracranial blood products. No mass lesion, midline shift or mass effect. Ventricles normal in size and morphology without hydrocephalus. No extra-axial fluid collection. Pituitary gland and suprasellar region within normal limits. Vascular: Major intracranial vascular flow voids are well maintained. Skull and upper cervical spine: Craniocervical junction within normal limits. Visualized upper cervical spine demonstrates no significant finding. Bone marrow signal intensity within normal limits. No scalp soft tissue abnormality. Sinuses/Orbits: Globes and orbital soft tissues are within normal limits. Paranasal sinuses are largely clear. No significant mastoid effusion. Other: None. IMPRESSION: Normal brain MRI. No acute intracranial abnormality identified. Electronically Signed   By: Rise Mu M.D.   On: 03/05/2023 20:40   CT Head Wo Contrast Result Date: 03/05/2023 CLINICAL DATA:  Provided history: Head trauma, abnormal mental status. Additional history provided: seizures. EXAM: CT HEAD WITHOUT CONTRAST TECHNIQUE: Contiguous axial images were obtained from the base of the skull  through the vertex without intravenous contrast. RADIATION DOSE REDUCTION: This exam was performed according to the departmental dose-optimization program which includes automated exposure control, adjustment of the mA and/or kV according to patient size and/or use of iterative reconstruction technique. COMPARISON:  Head CT 07/27/2022. FINDINGS: Brain: No age-advanced or lobar predominant cerebral atrophy. Small focus of abnormal cortical/subcortical hypodensity within the right occipital lobe (versus artifact) (series 3, image 13). There is no acute intracranial hemorrhage. No extra-axial fluid collection. No evidence of an intracranial mass. No midline shift. Vascular: No hyperdense vessel. Skull: No calvarial fracture or aggressive osseous lesion.  Sinuses/Orbits: No mass or acute finding within the imaged orbits. Mild left ethmoid sinusitis. Minimal mucosal thickening within the right sphenoid sinus. IMPRESSION: 1. Possible small acute cortical/subcortical infarct within the right occipital lobe (versus artifact). Consider a brain MRI for further evaluation. 2. Otherwise unremarkable non-contrast CT appearance of the brain. 3. Minor paranasal sinus disease at the imaged levels. Electronically Signed   By: Jackey Loge D.O.   On: 03/05/2023 16:48    Microbiology: Results for orders placed or performed during the hospital encounter of 07/27/22  MRSA Next Gen by PCR, Nasal     Status: None   Collection Time: 07/27/22 11:11 AM   Specimen: Nasal Mucosa; Nasal Swab  Result Value Ref Range Status   MRSA by PCR Next Gen NOT DETECTED NOT DETECTED Final    Comment: (NOTE) The GeneXpert MRSA Assay (FDA approved for NASAL specimens only), is one component of a comprehensive MRSA colonization surveillance program. It is not intended to diagnose MRSA infection nor to guide or monitor treatment for MRSA infections. Test performance is not FDA approved in patients less than 55 years old. Performed at [facility], [address] [place], [place] [address]   SARS Coronavirus 2 by RT PCR (hospital order, performed in [facility] hospital lab) *cepheid single result test* Anterior Nasal Swab     Status: None   Collection Time: 07/27/22 11:35 AM   Specimen: Anterior Nasal Swab  Result Value Ref Range Status   SARS Coronavirus 2 by RT PCR NEGATIVE NEGATIVE Final    Comment: (NOTE) SARS-CoV-2 target nucleic acids are NOT DETECTED.  The SARS-CoV-2 RNA is generally detectable in upper and lower respiratory specimens during the acute phase of infection. The lowest concentration of SARS-CoV-2 viral copies this assay can detect is 250 copies / mL. A negative result does not preclude SARS-CoV-2 infection and should not be used as the sole basis  for treatment or other patient management decisions.  A negative result may occur with improper specimen collection / handling, submission of specimen other than nasopharyngeal swab, presence of viral mutation(s) within the areas targeted by this assay, and inadequate number of viral copies (<250 copies / mL). A negative result must be combined with clinical observations, patient history, and epidemiological information.  Fact Sheet for Patients:   RoadLapTop.co.za  Fact Sheet for Healthcare Providers: http://kim-miller.com/  This test is not yet approved or  cleared by the [place] FDA and has been authorized for detection and/or diagnosis of SARS-CoV-2 by FDA under an Emergency Use Authorization (EUA).  This EUA will remain in effect (meaning this test can be used) for the duration of the COVID-19 declaration under Section 564(b)(1) of the Act, 21 U.S.C. section 360bbb-3(b)(1), unless the authorization is terminated or revoked sooner.  Performed at [facility], [address] [place], [place] [address]     Labs: CBC: Recent Labs  Lab  03/05/23 1323 03/06/23 1200 03/07/23 0627  WBC 11.0* 10.4 8.2  NEUTROABS 8.0* 6.8  --   HGB 12.3 13.8 10.4*  HCT 37.5 42.6 32.6*  MCV 76.8* 76.8* 77.8*  PLT 268 297 243   Basic Metabolic Panel: Recent Labs  Lab 03/05/23 1323 03/06/23 1200 03/07/23 0627 03/08/23 0503 03/09/23 0156  NA 134* 139 146* 139 138  K 3.0* 3.2* 3.8 3.5 4.0  CL 100 103 120* 110 107  CO2 23 24 23 24 26   GLUCOSE 172* 162* 92 85 112*  BUN 10 11 14 17 18   CREATININE 0.48 0.50 0.55 0.56 0.50  CALCIUM 8.9 9.5 8.4* 8.6* 9.0  MG  --   --   --  2.0  --    Liver Function Tests: Recent Labs  Lab 03/05/23 1323 03/06/23 1200  AST 23 21  ALT 19 20  ALKPHOS 77 88  BILITOT 1.0 1.1  PROT 7.2 7.9  ALBUMIN 3.6 4.1   CBG: Recent Labs  Lab 03/08/23 2019 03/08/23 2343 03/09/23 0522 03/09/23 0822  03/09/23 1206  GLUCAP 170* 96 141* 104* 112*    Discharge time spent: 33 minutes.  Signed: Marcelino Duster, MD [facility] 03/10/2023

## 2023-04-30 ENCOUNTER — Inpatient Hospital Stay

## 2023-04-30 ENCOUNTER — Inpatient Hospital Stay
Admission: EM | Admit: 2023-04-30 | Discharge: 2023-05-02 | DRG: 644 | Discharge status: Against Medical Advice | Attending: Internal Medicine | Admitting: Internal Medicine

## 2023-04-30 ENCOUNTER — Other Ambulatory Visit: Payer: Self-pay

## 2023-04-30 ENCOUNTER — Emergency Department

## 2023-04-30 DIAGNOSIS — E669 Obesity, unspecified: Secondary | ICD-10-CM | POA: Diagnosis present

## 2023-04-30 DIAGNOSIS — M545 Low back pain, unspecified: Secondary | ICD-10-CM | POA: Diagnosis present

## 2023-04-30 DIAGNOSIS — Z8249 Family history of ischemic heart disease and other diseases of the circulatory system: Secondary | ICD-10-CM

## 2023-04-30 DIAGNOSIS — Z8049 Family history of malignant neoplasm of other genital organs: Secondary | ICD-10-CM

## 2023-04-30 DIAGNOSIS — Z794 Long term (current) use of insulin: Secondary | ICD-10-CM | POA: Diagnosis not present

## 2023-04-30 DIAGNOSIS — Z79899 Other long term (current) drug therapy: Secondary | ICD-10-CM

## 2023-04-30 DIAGNOSIS — F32A Depression, unspecified: Secondary | ICD-10-CM | POA: Diagnosis present

## 2023-04-30 DIAGNOSIS — L83 Acanthosis nigricans: Secondary | ICD-10-CM | POA: Diagnosis present

## 2023-04-30 DIAGNOSIS — E876 Hypokalemia: Secondary | ICD-10-CM | POA: Diagnosis present

## 2023-04-30 DIAGNOSIS — Z6830 Body mass index (BMI) 30.0-30.9, adult: Secondary | ICD-10-CM | POA: Diagnosis not present

## 2023-04-30 DIAGNOSIS — E0581 Other thyrotoxicosis with thyrotoxic crisis or storm: Secondary | ICD-10-CM | POA: Diagnosis not present

## 2023-04-30 DIAGNOSIS — R7989 Other specified abnormal findings of blood chemistry: Secondary | ICD-10-CM | POA: Diagnosis not present

## 2023-04-30 DIAGNOSIS — N611 Abscess of the breast and nipple: Secondary | ICD-10-CM | POA: Diagnosis present

## 2023-04-30 DIAGNOSIS — G43909 Migraine, unspecified, not intractable, without status migrainosus: Secondary | ICD-10-CM | POA: Diagnosis present

## 2023-04-30 DIAGNOSIS — Z800 Family history of malignant neoplasm of digestive organs: Secondary | ICD-10-CM

## 2023-04-30 DIAGNOSIS — I5032 Chronic diastolic (congestive) heart failure: Secondary | ICD-10-CM | POA: Diagnosis present

## 2023-04-30 DIAGNOSIS — Z5329 Procedure and treatment not carried out because of patient's decision for other reasons: Secondary | ICD-10-CM | POA: Diagnosis not present

## 2023-04-30 DIAGNOSIS — G8929 Other chronic pain: Secondary | ICD-10-CM | POA: Diagnosis present

## 2023-04-30 DIAGNOSIS — E871 Hypo-osmolality and hyponatremia: Secondary | ICD-10-CM | POA: Diagnosis not present

## 2023-04-30 DIAGNOSIS — F419 Anxiety disorder, unspecified: Secondary | ICD-10-CM | POA: Diagnosis present

## 2023-04-30 DIAGNOSIS — E0591 Thyrotoxicosis, unspecified with thyrotoxic crisis or storm: Principal | ICD-10-CM | POA: Diagnosis present

## 2023-04-30 DIAGNOSIS — E039 Hypothyroidism, unspecified: Secondary | ICD-10-CM | POA: Diagnosis present

## 2023-04-30 DIAGNOSIS — F445 Conversion disorder with seizures or convulsions: Secondary | ICD-10-CM | POA: Diagnosis present

## 2023-04-30 DIAGNOSIS — E872 Acidosis, unspecified: Secondary | ICD-10-CM | POA: Diagnosis present

## 2023-04-30 DIAGNOSIS — I110 Hypertensive heart disease with heart failure: Secondary | ICD-10-CM | POA: Diagnosis present

## 2023-04-30 DIAGNOSIS — E0501 Thyrotoxicosis with diffuse goiter with thyrotoxic crisis or storm: Secondary | ICD-10-CM | POA: Diagnosis present

## 2023-04-30 DIAGNOSIS — R7401 Elevation of levels of liver transaminase levels: Secondary | ICD-10-CM | POA: Diagnosis present

## 2023-04-30 DIAGNOSIS — R296 Repeated falls: Secondary | ICD-10-CM | POA: Diagnosis present

## 2023-04-30 DIAGNOSIS — I2489 Other forms of acute ischemic heart disease: Secondary | ICD-10-CM | POA: Diagnosis present

## 2023-04-30 DIAGNOSIS — F17290 Nicotine dependence, other tobacco product, uncomplicated: Secondary | ICD-10-CM | POA: Diagnosis present

## 2023-04-30 DIAGNOSIS — R000 Tachycardia, unspecified: Secondary | ICD-10-CM | POA: Diagnosis not present

## 2023-04-30 DIAGNOSIS — R569 Unspecified convulsions: Secondary | ICD-10-CM | POA: Diagnosis present

## 2023-04-30 DIAGNOSIS — R9431 Abnormal electrocardiogram [ECG] [EKG]: Secondary | ICD-10-CM | POA: Diagnosis not present

## 2023-04-30 DIAGNOSIS — Z825 Family history of asthma and other chronic lower respiratory diseases: Secondary | ICD-10-CM

## 2023-04-30 DIAGNOSIS — E109 Type 1 diabetes mellitus without complications: Secondary | ICD-10-CM | POA: Diagnosis present

## 2023-04-30 DIAGNOSIS — G629 Polyneuropathy, unspecified: Secondary | ICD-10-CM | POA: Diagnosis present

## 2023-04-30 DIAGNOSIS — Z833 Family history of diabetes mellitus: Secondary | ICD-10-CM

## 2023-04-30 LAB — CBC
HCT: 40.5 % (ref 36.0–46.0)
Hemoglobin: 13.5 g/dL (ref 12.0–15.0)
MCH: 24.2 pg — ABNORMAL LOW (ref 26.0–34.0)
MCHC: 33.3 g/dL (ref 30.0–36.0)
MCV: 72.5 fL — ABNORMAL LOW (ref 80.0–100.0)
Platelets: 255 10*3/uL (ref 150–400)
RBC: 5.59 MIL/uL — ABNORMAL HIGH (ref 3.87–5.11)
RDW: 13.3 % (ref 11.5–15.5)
WBC: 11 10*3/uL — ABNORMAL HIGH (ref 4.0–10.5)
nRBC: 0 % (ref 0.0–0.2)

## 2023-04-30 LAB — HEPATIC FUNCTION PANEL
ALT: 38 U/L (ref 0–44)
AST: 46 U/L — ABNORMAL HIGH (ref 15–41)
Albumin: 3.8 g/dL (ref 3.5–5.0)
Alkaline Phosphatase: 83 U/L (ref 38–126)
Bilirubin, Direct: 0.3 mg/dL — ABNORMAL HIGH (ref 0.0–0.2)
Indirect Bilirubin: 1.4 mg/dL — ABNORMAL HIGH (ref 0.3–0.9)
Total Bilirubin: 1.7 mg/dL — ABNORMAL HIGH (ref 0.0–1.2)
Total Protein: 7.2 g/dL (ref 6.5–8.1)

## 2023-04-30 LAB — BASIC METABOLIC PANEL WITH GFR
Anion gap: 14 (ref 5–15)
BUN: 16 mg/dL (ref 6–20)
CO2: 20 mmol/L — ABNORMAL LOW (ref 22–32)
Calcium: 9.2 mg/dL (ref 8.9–10.3)
Chloride: 102 mmol/L (ref 98–111)
Creatinine, Ser: 0.56 mg/dL (ref 0.44–1.00)
GFR, Estimated: >60 mL/min (ref >60)
Glucose, Bld: 168 mg/dL — ABNORMAL HIGH (ref 70–99)
Potassium: 3 mmol/L — ABNORMAL LOW (ref 3.5–5.1)
Sodium: 136 mmol/L (ref 135–145)

## 2023-04-30 LAB — TSH: TSH: <0.01 u[IU]/mL — ABNORMAL LOW (ref 0.350–4.500)

## 2023-04-30 LAB — TROPONIN I (HIGH SENSITIVITY)
Troponin I (High Sensitivity): 771 ng/L (ref <18)
Troponin I (High Sensitivity): 573 ng/L (ref <18)

## 2023-04-30 LAB — PHOSPHORUS: Phosphorus: 3.9 mg/dL (ref 2.5–4.6)

## 2023-04-30 LAB — D-DIMER, QUANTITATIVE: D-Dimer, Quant: 0.69 ug{FEU}/mL — ABNORMAL HIGH (ref 0.00–0.50)

## 2023-04-30 LAB — PROCALCITONIN: Procalcitonin: <0.1 ng/mL

## 2023-04-30 LAB — CBG MONITORING, ED: Glucose-Capillary: 122 mg/dL — ABNORMAL HIGH (ref 70–99)

## 2023-04-30 LAB — T4, FREE: Free T4: 4.78 ng/dL — ABNORMAL HIGH (ref 0.61–1.12)

## 2023-04-30 LAB — MAGNESIUM: Magnesium: 2 mg/dL (ref 1.7–2.4)

## 2023-04-30 LAB — LACTIC ACID, PLASMA: Lactic Acid, Venous: 1.4 mmol/L (ref 0.5–1.9)

## 2023-04-30 MED ORDER — MORPHINE SULFATE (PF) 4 MG/ML IV SOLN
4.0000 mg | Freq: Once | INTRAVENOUS | Status: AC
  Administered 2023-04-30: 4 mg via INTRAVENOUS
  Filled 2023-04-30: qty 1

## 2023-04-30 MED ORDER — POTASSIUM CHLORIDE 10 MEQ/100ML IV SOLN
10.0000 meq | INTRAVENOUS | Status: AC
  Administered 2023-05-01 (×3): 10 meq via INTRAVENOUS
  Filled 2023-04-30 (×2): qty 100

## 2023-04-30 MED ORDER — DEXAMETHASONE SODIUM PHOSPHATE 4 MG/ML IJ SOLN
2.0000 mg | Freq: Once | INTRAMUSCULAR | Status: AC
  Administered 2023-04-30: 2 mg via INTRAVENOUS
  Filled 2023-04-30: qty 1

## 2023-04-30 MED ORDER — INSULIN GLARGINE-YFGN 100 UNIT/ML ~~LOC~~ SOLN: 6.0000 [IU] | Freq: Every day | SUBCUTANEOUS | Status: DC

## 2023-04-30 MED ORDER — PROPRANOLOL HCL 40 MG PO TABS
80.0000 mg | ORAL_TABLET | Freq: Three times a day (TID) | ORAL | Status: DC
Start: 2023-05-01 — End: 2023-05-02
  Administered 2023-05-01 – 2023-05-02 (×4): 80 mg via ORAL
  Filled 2023-04-30: qty 2
  Filled 2023-04-30 (×2): qty 4
  Filled 2023-04-30 (×2): qty 2

## 2023-04-30 MED ORDER — SODIUM CHLORIDE 0.9 % IV BOLUS
1000.0000 mL | Freq: Once | INTRAVENOUS | Status: AC
  Administered 2023-04-30: 1000 mL via INTRAVENOUS

## 2023-04-30 MED ORDER — DEXAMETHASONE SODIUM PHOSPHATE 4 MG/ML IJ SOLN
4.0000 mg | Freq: Three times a day (TID) | INTRAMUSCULAR | Status: DC
  Administered 2023-05-01 – 2023-05-02 (×5): 4 mg via INTRAVENOUS
  Filled 2023-04-30 (×6): qty 1

## 2023-04-30 MED ORDER — INSULIN ASPART 100 UNIT/ML IJ SOLN: 0.0000 [IU] | INTRAMUSCULAR | Status: DC

## 2023-04-30 MED ORDER — IODINE STRONG (LUGOLS) 5 % PO SOLN
0.2000 mL | Freq: Three times a day (TID) | ORAL | Status: DC
  Administered 2023-05-01 – 2023-05-02 (×5): 0.2 mL via ORAL
  Filled 2023-04-30 (×6): qty 0.2

## 2023-04-30 MED ORDER — PROPYLTHIOURACIL 50 MG PO TABS
250.0000 mg | ORAL_TABLET | ORAL | Status: DC
  Filled 2023-04-30: qty 5

## 2023-04-30 MED ORDER — LAMOTRIGINE 100 MG PO TABS
100.0000 mg | ORAL_TABLET | Freq: Three times a day (TID) | ORAL | Status: DC
  Administered 2023-05-01 – 2023-05-02 (×5): 100 mg via ORAL
  Filled 2023-04-30 (×3): qty 1
  Filled 2023-04-30 (×2): qty 4

## 2023-04-30 MED ORDER — ENOXAPARIN SODIUM 60 MG/0.6ML IJ SOSY
0.5000 mg/kg | PREFILLED_SYRINGE | INTRAMUSCULAR | Status: DC
  Administered 2023-05-01 (×2): 42.5 mg via SUBCUTANEOUS
  Filled 2023-04-30 (×3): qty 0.6

## 2023-04-30 MED ORDER — DOCUSATE SODIUM 100 MG PO CAPS: 100.0000 mg | ORAL_CAPSULE | Freq: Two times a day (BID) | ORAL | Status: DC | PRN

## 2023-04-30 MED ORDER — PROPRANOLOL HCL 20 MG PO TABS
60.0000 mg | ORAL_TABLET | Freq: Once | ORAL | Status: AC
  Administered 2023-04-30: 60 mg via ORAL

## 2023-04-30 MED ORDER — PROPYLTHIOURACIL 50 MG PO TABS
250.0000 mg | ORAL_TABLET | ORAL | Status: AC
  Administered 2023-05-01 (×2): 250 mg via ORAL
  Filled 2023-04-30 (×2): qty 5

## 2023-04-30 MED ORDER — ONDANSETRON HCL 4 MG/2ML IJ SOLN
4.0000 mg | Freq: Once | INTRAMUSCULAR | Status: AC
  Administered 2023-04-30: 4 mg via INTRAVENOUS
  Filled 2023-04-30: qty 2

## 2023-04-30 MED ORDER — POLYETHYLENE GLYCOL 3350 17 G PO PACK: 17.0000 g | PACK | Freq: Every day | ORAL | Status: DC | PRN

## 2023-04-30 MED ORDER — PROPYLTHIOURACIL 50 MG PO TABS: 800.0000 mg | ORAL_TABLET | Freq: Three times a day (TID) | ORAL | Status: DC

## 2023-04-30 MED ORDER — METHIMAZOLE 10 MG PO TABS
20.0000 mg | ORAL_TABLET | Freq: Two times a day (BID) | ORAL | Status: DC
  Administered 2023-05-01 – 2023-05-02 (×3): 20 mg via ORAL
  Filled 2023-04-30 (×3): qty 2

## 2023-04-30 MED ORDER — INSULIN GLARGINE-YFGN 100 UNIT/ML ~~LOC~~ SOLN
6.0000 [IU] | Freq: Every day | SUBCUTANEOUS | Status: DC
  Administered 2023-05-01 (×2): 6 [IU] via SUBCUTANEOUS
  Filled 2023-04-30 (×4): qty 0.06

## 2023-04-30 MED ORDER — PROPYLTHIOURACIL 50 MG PO TABS
500.0000 mg | ORAL_TABLET | Freq: Once | ORAL | Status: AC
  Administered 2023-04-30: 500 mg via ORAL
  Filled 2023-04-30: qty 2
  Filled 2023-04-30: qty 10

## 2023-04-30 MED ORDER — PROPRANOLOL HCL 20 MG PO TABS
80.0000 mg | ORAL_TABLET | Freq: Once | ORAL | Status: DC
  Filled 2023-04-30: qty 4

## 2023-04-30 MED ORDER — INSULIN ASPART 100 UNIT/ML IJ SOLN
0.0000 [IU] | INTRAMUSCULAR | Status: DC
  Administered 2023-05-01 (×3): 2 [IU] via SUBCUTANEOUS
  Administered 2023-05-01: 3 [IU] via SUBCUTANEOUS
  Filled 2023-04-30 (×3): qty 1

## 2023-04-30 MED ORDER — POTASSIUM CHLORIDE CRYS ER 20 MEQ PO TBCR
40.0000 meq | EXTENDED_RELEASE_TABLET | Freq: Once | ORAL | Status: AC
  Administered 2023-05-01: 40 meq via ORAL
  Filled 2023-04-30: qty 2

## 2023-04-30 MED ORDER — LORAZEPAM 2 MG/ML IJ SOLN
1.0000 mg | Freq: Once | INTRAMUSCULAR | Status: AC
Start: 2023-04-30 — End: 2023-04-30
  Administered 2023-04-30: 1 mg via INTRAVENOUS
  Filled 2023-04-30: qty 1

## 2023-04-30 NOTE — ED Triage Notes (Addendum)
 Pt to triage via wheelchair.  Pt brought in via ems from home with seizure like activity. Sx for 2 days.  Pt reports taking rx meds for seizures.  No headache.   Pt diaphoretic in triage, heart rate elevated.   EKG done.  Pt to room 12. Pt alert  speech clear.

## 2023-04-30 NOTE — H&P (Incomplete)
 NAME:  Nancy [name], MRN:  [id number], DOB:  04-Jul-2000, LOS: 0 ADMISSION DATE:  04/30/2023, CONSULTATION DATE:  04/30/23 REFERRING MD:  Dr. Azalee Bolds, CHIEF COMPLAINT:   seizures  History of Present Illness:  Possible hemoptysis/coffe ground emesis  Poor po intake, 15 units with meals and lantus  Bad migraines, hot flashes Dyspnea/ hyperventilation  Neuropathy all extremities and  stomach L breat abscess? Abx? Surgery (2 weeks)  23 yo F presenting to [facility] ED from home on 04/30/23 for evaluation of seizures.  History obtained per patient bedside report and chart review. Patient was in her normal state of health until 04/27/23 when she reports "feeling hot and just not good". On 04/28/23 she began experiencing nausea/ vomiting/ diarrhea, seizures, falls (an estimated 15 times over 4 days) - hitting her head an estimated 5 times. She reports generalized sharp stabbing pain, dyspnea - that she describes as intermittent hyperventilating and headache. She describes knowing her thyroid  was off because when that happens she starts having nausea/ vomiting and seizures.   While getting ready to come to the ED on 04/30/23 the vision in her Right eye "went out". She describes only seeing black, no light and that this was sudden but as not improved. Pupil is reactive to light and she is able to make eye contact without difficulty. She reported previous vision changes similar to this to the EDP, she denied previous vision changes during our discussion.  No other focal deficits reported. She reports chronic neuropathy and migraines & endorses dizziness when changing positions. She denies incontinence, focal weakness, changes to her chronic migraine symptoms, vision changes in Left eye, increased edema, weight changes, chest pain, urinary symptoms, fever/chills, respiratory symptoms.  Of note she has had multiple admissions for uncontrolled hyperthyroidism requiring brief ICU monitoring. Endocrine & PCP saw  the patient in Jan 2025, she was scheduled to meet with a surgeon for a thyroidectomy but missed the appointment. She was also started on antibiotics for suspected breast abscess. Patient stated the left breast mass is still present, however she denies redness/ tenderness/ drainage/ warmth. Reporting she completed her antibiotic course in Jan 2025.  She reports taking medication as prescribed including outpatient regimens of lamictal , methimazole  and propanolol. She admits to occasional ETOH consumption, daily vaping, and THC edibles.   ED course: Upon arrival, patient was tachycardic with a HR of 150. Medications given: *** Initial Vitals: *** Significant labs: (Labs/ Imaging personally reviewed) I, Recardo Canal Rust-Chester, AGACNP-BC, personally viewed and interpreted this ECG. EKG Interpretation: Date: ***, EKG Time: ***, Rate: ***, Rhythm: ***, QRS Axis:  *** Intervals: ***, ST/T Wave abnormalities: ***, Narrative Interpretation: *** Chemistry: Na+:***, K+: ***, BUN/Cr.: ***, Serum CO2/ AG: *** Hematology: WBC: ***, Hgb: ***,  Troponin: ***, BNP: ***, Lactic/ PCT: ***, COVID-19 & Influenza A/B: *** ABG: *** CXR ***: *** CT ***: ***  PCCM consulted for admission due to ***.   Pertinent  Medical History  Grave's Disease with Thyroid  Storm Chronic Lower Back Pain Ovarian Cyst Anxiety and Depression T1DM HFpEF Psychogenic Nonepileptic Seizures on Lamictal  HTN Polysubstance Abuse  Significant Hospital Events: Including procedures, antibiotic start and stop dates in addition to other pertinent events     Interim History / Subjective:  ***  Objective   Blood pressure 101/67, pulse (!) 129, temperature 99.2 F (37.3 C), temperature source Oral, resp. rate (!) 30, height 5\' 3"  (1.6 m), weight 86.2 kg, last menstrual period 04/16/2023, SpO2 100%.       No intake or  output data in the 24 hours ending 04/30/23 2203 Filed Weights   04/30/23 1758  Weight: 86.2 kg     Examination: General: *** HENT: *** Lungs: *** Cardiovascular: *** Abdomen: *** Extremities: *** Neuro: *** GU: ***  Resolved Hospital Problem list   ***  Assessment & Plan:  ***  Best Practice (right click and "Reselect all SmartList Selections" daily)   Diet/type: {diet type:25684} DVT prophylaxis {anticoagulation:25687} Pressure ulcer(s): {pressure ulcer(s):31683} GI prophylaxis: {NW:29562} Lines: {Central Venous Access:25771} Foley:  {Central Venous Access:25691} Code Status:  {Code Status:26939} Last date of multidisciplinary goals of care discussion [***]  Labs   CBC: Recent Labs  Lab 04/30/23 2014  WBC 11.0*  HGB 13.5  HCT 40.5  MCV 72.5*  PLT 255    Basic Metabolic Panel: Recent Labs  Lab 04/30/23 2014  NA 136  K 3.0*  CL 102  CO2 20*  GLUCOSE 168*  BUN 16  CREATININE 0.56  CALCIUM  9.2   GFR: Estimated Creatinine Clearance: 112.8 mL/min (by C-G formula based on SCr of 0.56 mg/dL). Recent Labs  Lab 04/30/23 2014  WBC 11.0*    Liver Function Tests: No results for input(s): "AST", "ALT", "ALKPHOS", "BILITOT", "PROT", "ALBUMIN" in the last 168 hours. No results for input(s): "LIPASE", "AMYLASE" in the last 168 hours. No results for input(s): "AMMONIA" in the last 168 hours.  ABG    Component Value Date/Time   PHART 7.402 12/01/2018 0120   PCO2ART 34.2 12/01/2018 0120   PO2ART 84.0 12/01/2018 0120   HCO3 28.0 07/27/2022 0830   TCO2 29 04/10/2020 1105   ACIDBASEDEF 4.4 (H) 08/06/2019 1335   O2SAT 91.9 07/27/2022 0830     Coagulation Profile: No results for input(s): "INR", "PROTIME" in the last 168 hours.  Cardiac Enzymes: No results for input(s): "CKTOTAL", "CKMB", "CKMBINDEX", "TROPONINI" in the last 168 hours.  HbA1C: Hgb A1c MFr Bld  Date/Time Value Ref Range Status  03/06/2023 08:59 PM 5.8 (H) 4.8 - 5.6 % Final    Comment:    (NOTE) Pre diabetes:          5.7%-6.4%  Diabetes:              >6.4%  Glycemic control  for   <7.0% adults with diabetes   07/27/2022 08:09 AM 6.1 (H) 4.8 - 5.6 % Final    Comment:    (NOTE)         Prediabetes: 5.7 - 6.4         Diabetes: >6.4         Glycemic control for adults with diabetes: <7.0     CBG: No results for input(s): "GLUCAP" in the last 168 hours.  Review of Systems:   ***  Past Medical History:  She,  has a past medical history of Acanthosis nigricans, Anxiety, CHF (congestive heart failure) (HCC), Chronic lower back pain, Depression, DKA, type 1 (HCC) (09/13/2018), Dyspepsia, Obesity, Ovarian cyst, Precocious adrenarche (HCC), Premature baby, Seizures (HCC), and Type II diabetes mellitus (HCC).   Surgical History:   Past Surgical History:  Procedure Laterality Date  . ABDOMINAL HERNIA REPAIR     "I was a baby"  . BIOPSY  10/12/2018   Procedure: BIOPSY;  Surgeon: Lajuan Pila, MD;  Location: [facility] ENDOSCOPY;  Service: Endoscopy;;  . BIOPSY  02/28/2020   Procedure: BIOPSY;  Surgeon: Annis Kinder, DO;  Location: [facility] ENDOSCOPY;  Service: Gastroenterology;;  . ESOPHAGOGASTRODUODENOSCOPY (EGD) WITH PROPOFOL  N/A 10/12/2018   Procedure: ESOPHAGOGASTRODUODENOSCOPY (EGD) WITH PROPOFOL ;  Surgeon:  Lajuan Pila, MD;  Location: [facility] ENDOSCOPY;  Service: Endoscopy;  Laterality: N/A;  . ESOPHAGOGASTRODUODENOSCOPY (EGD) WITH PROPOFOL  N/A 02/28/2020   Procedure: ESOPHAGOGASTRODUODENOSCOPY (EGD) WITH PROPOFOL ;  Surgeon: Annis Kinder, DO;  Location: [facility] ENDOSCOPY;  Service: Gastroenterology;  Laterality: N/A;  . FLEXIBLE SIGMOIDOSCOPY N/A 02/28/2020   Procedure: FLEXIBLE SIGMOIDOSCOPY;  Surgeon: Annis Kinder, DO;  Location: [facility] ENDOSCOPY;  Service: Gastroenterology;  Laterality: N/A;  . HERNIA REPAIR    . LEFT HEART CATH AND CORONARY ANGIOGRAPHY N/A 10/13/2018   Procedure: LEFT HEART CATH AND CORONARY ANGIOGRAPHY;  Surgeon: Odie Benne, MD;  Location: [facility] INVASIVE CV LAB;  Service: Cardiovascular;  Laterality: N/A;  . TONSILLECTOMY AND ADENOIDECTOMY     . WISDOM TOOTH EXTRACTION  2017     Social History:   reports that she has been smoking e-cigarettes. She has never used smokeless tobacco. She reports that she does not drink alcohol and does not use drugs.   Family History:  Her family history includes Allergic rhinitis in her mother; Asthma in her mother; Cervical cancer in her mother; Colon cancer in her maternal grandfather; Diabetes in her father, mother, paternal grandfather, and paternal grandmother; Eczema in her mother; Hyperlipidemia in her father; Hypertension in her father, maternal grandfather, mother, and paternal aunt; Obesity in her father, mother, paternal grandfather, and paternal grandmother. There is no history of Angioedema, Immunodeficiency, Urticaria, Stomach cancer, or Esophageal cancer.   Allergies Allergies  Allergen Reactions  . Oatmeal Anaphylaxis  . Tomato Anaphylaxis  . Acetaminophen  Other (See Comments)    Avoids due to liver  . Ibuprofen  Other (See Comments)    GI MD said to not take this anymore     Home Medications  Prior to Admission medications   Medication Sig Start Date End Date Taking? Authorizing Provider  esomeprazole  (NEXIUM ) 20 MG capsule Take 1 capsule (20 mg total) by mouth daily. 07/28/22 07/28/23  Luna Salinas, MD  furosemide  (LASIX ) 20 MG tablet Take 20 mg by mouth daily as needed for fluid or edema.    [provider]  Glucagon  (BAQSIMI  TWO PACK) 3 MG/DOSE POWD Place 1 spray into the nose as needed. 08/23/21   Wouk, Haynes Lips, MD  insulin  glargine (LANTUS ) 100 unit/mL SOPN Inject 20 Units into the skin 2 (two) times daily. Patient taking differently: Inject 60 Units into the skin 2 (two) times daily. 08/23/21   Wouk, Haynes Lips, MD  lamoTRIgine  (LAMICTAL ) 100 MG tablet Take 1 tablet (100 mg total) by mouth in the morning, at noon, and at bedtime. 10/05/21   Garrison Kanner, MD  lidocaine  (LIDODERM ) 5 % Place 1 patch onto the skin every 12 (twelve) hours. Remove & Discard patch  within 12 hours or as directed by MD 02/03/23 02/03/24  Twilla Galea, MD  loperamide  (IMODIUM ) 2 MG capsule Take 2 capsules (4 mg total) by mouth as needed for diarrhea or loose stools. 08/23/21   Wouk, Haynes Lips, MD  losartan  (COZAAR ) 25 MG tablet Take 1 tablet (25 mg total) by mouth daily. 08/23/21   Wouk, Haynes Lips, MD  lubiprostone  (AMITIZA ) 8 MCG capsule Take 8 mcg by mouth 2 (two) times daily with a meal.    [provider]  methimazole  (TAPAZOLE ) 10 MG tablet Take 2 tablets (20 mg total) by mouth 2 (two) times daily. 10/05/21   Garrison Kanner, MD  ondansetron  (ZOFRAN ) 4 MG tablet Take 1 tablet (4 mg total) by mouth daily as needed for nausea or vomiting. 10/10/22 10/10/23  Margery Sheets,  Kittie Perking, MD  propranolol  (INDERAL ) 80 MG tablet Take 1 tablet (80 mg total) by mouth 3 (three) times daily. 10/05/21   Garrison Kanner, MD  QUEtiapine  (SEROQUEL ) 100 MG tablet Take 300 mg by mouth at bedtime.    [provider]  QUEtiapine  (SEROQUEL ) 50 MG tablet Take 1 tablet (50 mg total) by mouth daily. 01/27/22   Sheril Dines, MD  prochlorperazine  (COMPAZINE ) 25 MG suppository PLACE 1 SUPPOSITORY (25 MG TOTAL) RECTALLY EVERY TWELVE HOURS AS NEEDED FOR NAUSEA OR VOMITING. 02/29/20 02/29/20  Burton Casey, MD     Critical care time: ***       Recardo Canal Rust-Chester, AGACNP-BC Acute Care Nurse Practitioner [facility]   [phone] / [phone] Please see Amion for details.

## 2023-04-30 NOTE — ED Notes (Signed)
 Critical result troponin 771, reported to Dr. Azalee Bolds.

## 2023-04-30 NOTE — Progress Notes (Signed)
 Anticoagulation monitoring(Lovenox ):  23 yo female ordered Lovenox  40 mg Q24h    Filed Weights   04/30/23 1758  Weight: 86.2 kg (190 lb)   BMI 33.7    Lab Results  Component Value Date   CREATININE 0.56 04/30/2023   CREATININE 0.50 03/09/2023   CREATININE 0.56 03/08/2023   Estimated Creatinine Clearance: 112.8 mL/min (by C-G formula based on SCr of 0.56 mg/dL). Hemoglobin & Hematocrit     Component Value Date/Time   HGB 13.5 04/30/2023 2014   HGB 12.8 10/05/2018 1133   HCT 40.5 04/30/2023 2014   HCT 40.8 10/05/2018 1133     Per Protocol for Patient with estCrcl > 30 ml/min and BMI > 30, will transition to Lovenox  42.5 mg Q24h.

## 2023-04-30 NOTE — ED Notes (Signed)
 From xray to exam room. Pt alert, NAD, calm, interactive, resps e/u, speaking in clear complete sentences. EDP at [facility].

## 2023-04-30 NOTE — H&P (Signed)
 NAME:  ICIS BUDREAU, MRN:  [id number], DOB:  [birth year], LOS: 0 ADMISSION DATE:  04/30/2023, CONSULTATION DATE:  04/30/23 REFERRING MD:  Dr. Azalee Bolds, CHIEF COMPLAINT:   seizures  History of Present Illness:  23 yo F presenting to [facility] ED from home via EMS on 04/30/23 for evaluation of seizures.  History obtained per patient bedside report and chart review. Patient was in her normal state of health until 04/27/23 when she reports "feeling hot and just not good". On 04/28/23 she began experiencing nausea/ vomiting/ diarrhea, seizures, falls (an estimated 15 times over 4 days) - hitting her head an estimated 5 times. She reports generalized sharp stabbing pain, dyspnea - that she describes as intermittent hyperventilating and headache. She also endorses poor PO intake. She describes knowing her thyroid  was off because when that happens she starts having nausea/ vomiting and seizures.   While getting ready to come to the ED on 04/30/23 the vision in her Right eye "went out". She describes only seeing black, no light and that this was sudden but as not improved. Pupil is reactive to light and she is able to make eye contact without difficulty. She reported previous vision changes similar to this to the EDP, she denied previous vision changes during our discussion.  No other focal deficits reported. She reports chronic neuropathy and migraines & endorses dizziness when changing positions. She denies incontinence, focal weakness, changes to her chronic migraine symptoms, vision changes in Left eye, increased edema, weight changes, chest pain, urinary symptoms, fever/chills, respiratory symptoms.  Of note she has had multiple admissions for uncontrolled hyperthyroidism requiring brief ICU monitoring. Endocrine & PCP saw the patient in Jan 2025, she was scheduled to meet with a surgeon for a thyroidectomy but missed the appointment. She was also started on antibiotics for suspected breast abscess. Patient  stated the left breast mass is still present, however she denies redness/ tenderness/ drainage/ warmth. Reporting she completed her antibiotic course in Jan 2025.  She reports taking medication as prescribed including outpatient regimens of lamictal , methimazole  and propanolol. She admits to occasional ETOH consumption, daily vaping, and THC edibles.   ED course: Upon arrival, patient alert and responsive with tachycardia & diaphoresis. Labs significant for hypokalemia, mild hyponatremia, mild NAGMA, mild leukocytosis Medications given: decadron  2 mg, ativan  1 mg, morphine  8 mg, propanolol 60 mg, PTU 500 mg, 2 L IVF bolus Initial Vitals: 99.2, 151, 20, 100/74 & 100% on RA Significant labs: (Labs/ Imaging personally reviewed) I, Recardo Canal Rust[place], AGACNP-BC, personally viewed and interpreted this ECG. EKG Interpretation: Date: 04/30/23, EKG Time: 18:00, Rate: 152, Rhythm: ST, QRS Axis: normal, Intervals: prolonged QTc, ST/T Wave abnormalities: none, Narrative Interpretation: ST with prolonged Qtc Chemistry: Na+:136, K+: 3.0, BUN/Cr.: 16/ 0.56, Serum CO2/ AG: 20/ 14, AST: 46, T.Bili: 1.7 Hematology: WBC: 11.0, Hgb: 13.5,  Troponin: 771 > 573, Lactic/ PCT: 1.4 / <0.10,  CXR 04/30/23: no active cardiopulmonary disease CT head wo contrast 04/30/23: negative for acute intracranial abnormality CT cervical spine wo contrast 04/30/23: negative for acute displaced fracture  PCCM consulted for admission due to suspected thyroid  storm secondary to uncontrolled hyperthyroidism requiring close monitoring.  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  Interim History / Subjective:  Patient alert and responsive. Plan of care discussed, all questions and concerns answered at this time.  Objective   Blood pressure 101/67, pulse (!) 129, temperature 99.2 F (37.3 C), temperature source Oral, resp. rate (!) 30, height 5\' 3"  (1.6 m), weight 86.2 kg, last menstrual period 04/16/2023, SpO2 100%.       No intake or output data in the 24 hours ending 04/30/23 2203 Filed Weights   04/30/23 1758  Weight: 86.2 kg    Examination: General: Adult female, acutely ill, lying in bed, NAD HEENT: MM pink/moist, anicteric, atraumatic, neck supple Neuro: A&O x 4, able to follow commands, PERRL +3, MAE CV: s1s2 RRR, ST on monitor, no r/m/g Pulm: Regular, non labored on RA, breath sounds clear-BUL & clear/diminished-BLL GI: soft, rounded, tender to palpation, bs x 4 GU: foley in place with clear yellow urine Skin: palpable mass on Left breast near nipple- non tender, no redness, no drainage Extremities: warm/dry, pulses + 2 R/P, no edema noted  Resolved Hospital Problem list     Assessment & Plan:  Suspected Breakthrough Seizures Right eye vision changes without additional focal deficits Pt has had multiple EEG's and continuous EEG monitoring findings were no evidence of seizures or epileptiform activity identified captured only psychogenic non-epileptic spells. - STAT CT head wo contrast - EEG ordered - ativan  IV PRN for seizure activity - UDS pending - Lamictal  level pending > continue outpatient Lamictal  regimen 100 mg TID - seizure precautions - consult neurology PRN depending on w/u above  Thyroid  Storm secondary to uncontrolled Hyperthyroidism in the setting of Graves Disease Patient loaded with 60 mg of propanolol and 500 mg of PTU in ED - continue 250 mg of PTU Q 4 for 2 more doses - start outpatient regimen of methimazole  30 mg BID tomorrow - Lugol's 0.2 mL TID - decadron  4  mg Q 8  Elevated Troponin secondary to demand ischemia Chronic HFpEF without exacerbation PMHx: HTN - Echocardiogram ordered - Hold Losartan , consider restarting as patient stabilizes  - Continuous cardiac monitoring  - Strict I/O's: alert provider if UOP < 0.5 mL/kg/hr - Daily weights to assess volume status - Diurese with the use of IV lasix  - as renal function and hemodynamics allow - Supplemental oxygen as needed, maintain SpO2 > 90%  Hypokalemia Mild Hyponatremia Mild NAGMA - Daily BMP, replace electrolytes PRN - K+ supplementation - IVF resuscitation  Mildly Elevated AST Nausea/Vomiting - zofran  PRN - trend hepatic function - consider CT abdomen/pelvis PRN  Type 1 Diabetes Mellitus At risk for Steroid Induced Hyperglycemia - Monitor CBG Q 4 hours - SSI moderate dosing - target range while in ICU: 140-180 - follow ICU hyper/hypo-glycemia protocol - continue glargine, reduce to 6 units at bedtime while NPO  Frequent Falls - fall precautions - echo ordered  - STAT CT cervical spine wo contrast  Left Breast Mass PCT negative, suspect mild leukocytosis reactive - trend PCT, monitor WBC/fever curve - US  left breast ordered  Best Practice (right click and "Reselect all SmartList Selections" daily)  Diet/type: NPO w/ oral meds DVT prophylaxis LMWH Pressure ulcer(s): N/A GI prophylaxis: PPI Lines: N/A Foley:  N/A Code Status:  full code Last date of multidisciplinary goals of care discussion [04/30/23]  Labs   CBC: Recent Labs  Lab 04/30/23 2014  WBC 11.0*  HGB 13.5  HCT 40.5  MCV 72.5*  PLT 255    Basic Metabolic Panel: Recent Labs  Lab 04/30/23 2014  NA 136  K 3.0*  CL 102  CO2 20*  GLUCOSE 168*  BUN 16  CREATININE 0.56  CALCIUM  9.2   GFR: Estimated Creatinine Clearance: 112.8 mL/min (by C-G formula based on SCr of 0.56 mg/dL). Recent Labs  Lab 04/30/23 2014  WBC 11.0*    Liver Function Tests: No results for input(s): "AST", "ALT",  "ALKPHOS", "BILITOT", "PROT", "ALBUMIN" in the last 168 hours. No results for input(s): "LIPASE", "AMYLASE" in the last 168 hours. No results for input(s): "AMMONIA" in the last 168 hours.  ABG    Component Value Date/Time   PHART 7.402 12/01/2018 0120   PCO2ART 34.2 12/01/2018 0120   PO2ART 84.0 12/01/2018 0120   HCO3 28.0 07/27/2022 0830   TCO2 29 04/10/2020 1105   ACIDBASEDEF 4.4 (H) 08/06/2019 1335   O2SAT 91.9 07/27/2022 0830     Coagulation Profile: No results for input(s): "INR", "PROTIME" in the last 168 hours.  Cardiac Enzymes: No results for input(s): "CKTOTAL", "CKMB", "CKMBINDEX", "TROPONINI" in the last 168 hours.  HbA1C: Hgb A1c MFr Bld  Date/Time Value Ref Range Status  03/06/2023 08:59 PM 5.8 (H) 4.8 - 5.6 % Final    Comment:    (NOTE) Pre diabetes:          5.7%-6.4%  Diabetes:              >6.4%  Glycemic control for   <7.0% adults with diabetes   07/27/2022 08:09 AM 6.1 (H) 4.8 - 5.6 % Final    Comment:    (NOTE)         Prediabetes: 5.7 - 6.4         Diabetes: >6.4         Glycemic control for adults with diabetes: <7.0     CBG: No results for input(s): "GLUCAP" in the last 168 hours.  Review of Systems: Positives in BOLD  Gen: Denies fever, chills, weight change, fatigue, night sweats HEENT: Denies vision changes R eye, blurred vision, double vision, hearing loss, tinnitus, sinus congestion, rhinorrhea, sore throat, neck stiffness, dysphagia PULM: Denies shortness of breath, cough, sputum production, hemoptysis, wheezing CV: Denies chest pain, edema, orthopnea, paroxysmal nocturnal dyspnea, palpitations GI: Denies abdominal pain, nausea, vomiting, diarrhea, hematochezia, melena, constipation, change in bowel habits GU: Denies dysuria, hematuria, polyuria, oliguria, urethral discharge Endocrine: Denies hot or cold intolerance, polyuria, polyphagia or appetite change Derm: Denies rash, dry skin, scaling or peeling skin change Heme: Denies  easy bruising, bleeding, bleeding gums Neuro: Denies headache, seizures, numbness, weakness, slurred speech, loss of memory or consciousness  Past Medical History:  She,  has a past medical history of Acanthosis nigricans, Anxiety, CHF (congestive heart failure) (HCC), Chronic lower back pain, Depression, DKA, type 1 (HCC) (09/13/2018), Dyspepsia, Obesity, Ovarian cyst, Precocious adrenarche (HCC), Premature baby, Seizures (HCC), and Type II diabetes mellitus (HCC).   Surgical History:   Past Surgical History:  Procedure Laterality Date   ABDOMINAL HERNIA REPAIR     "I was a baby"   BIOPSY  10/12/2018   Procedure: BIOPSY;  Surgeon: Lajuan Pila, MD;  Location: [facility] ENDOSCOPY;  Service: Endoscopy;;   BIOPSY  02/28/2020   Procedure: BIOPSY;  Surgeon: Annis Kinder, DO;  Location: [facility] ENDOSCOPY;  Service: Gastroenterology;;   ESOPHAGOGASTRODUODENOSCOPY (EGD) WITH PROPOFOL  N/A 10/12/2018   Procedure: ESOPHAGOGASTRODUODENOSCOPY (EGD) WITH PROPOFOL ;  Surgeon: Lajuan Pila, MD;  Location: [facility] ENDOSCOPY;  Service: Endoscopy;  Laterality: N/A;   ESOPHAGOGASTRODUODENOSCOPY (EGD) WITH PROPOFOL  N/A 02/28/2020  Procedure: ESOPHAGOGASTRODUODENOSCOPY (EGD) WITH PROPOFOL ;  Surgeon: Annis Kinder, DO;  Location: [facility] ENDOSCOPY;  Service: Gastroenterology;  Laterality: N/A;   FLEXIBLE SIGMOIDOSCOPY N/A 02/28/2020   Procedure: FLEXIBLE SIGMOIDOSCOPY;  Surgeon: Annis Kinder, DO;  Location: [facility] ENDOSCOPY;  Service: Gastroenterology;  Laterality: N/A;   HERNIA REPAIR     LEFT HEART CATH AND CORONARY ANGIOGRAPHY N/A 10/13/2018   Procedure: LEFT HEART CATH AND CORONARY ANGIOGRAPHY;  Surgeon: Odie Benne, MD;  Location: [facility] INVASIVE CV LAB;  Service: Cardiovascular;  Laterality: N/A;   TONSILLECTOMY AND ADENOIDECTOMY     WISDOM TOOTH EXTRACTION  2017     Social History:   reports that she has been smoking e-cigarettes. She has never used smokeless tobacco. She reports that she does not drink  alcohol and does not use drugs.   Family History:  Her family history includes Allergic rhinitis in her mother; Asthma in her mother; Cervical cancer in her mother; Colon cancer in her maternal grandfather; Diabetes in her father, mother, paternal grandfather, and paternal grandmother; Eczema in her mother; Hyperlipidemia in her father; Hypertension in her father, maternal grandfather, mother, and paternal aunt; Obesity in her father, mother, paternal grandfather, and paternal grandmother. There is no history of Angioedema, Immunodeficiency, Urticaria, Stomach cancer, or Esophageal cancer.   Allergies Allergies  Allergen Reactions   Oatmeal Anaphylaxis   Tomato Anaphylaxis   Acetaminophen  Other (See Comments)    Avoids due to liver   Ibuprofen  Other (See Comments)    GI MD said to not take this anymore     Home Medications  Prior to Admission medications   Medication Sig Start Date End Date Taking? Authorizing Provider  esomeprazole  (NEXIUM ) 20 MG capsule Take 1 capsule (20 mg total) by mouth daily. 07/28/22 07/28/23  Amin, Sumayya, MD  furosemide  (LASIX ) 20 MG tablet Take 20 mg by mouth daily as needed for fluid or edema.    [provider]  Glucagon  (BAQSIMI  TWO PACK) 3 MG/DOSE POWD Place 1 spray into the nose as needed. 08/23/21   Wouk, Haynes Lips, MD  insulin  glargine (LANTUS ) 100 unit/mL SOPN Inject 20 Units into the skin 2 (two) times daily. Patient taking differently: Inject 60 Units into the skin 2 (two) times daily. 08/23/21   Wouk, Haynes Lips, MD  lamoTRIgine  (LAMICTAL ) 100 MG tablet Take 1 tablet (100 mg total) by mouth in the morning, at noon, and at bedtime. 10/05/21   Garrison Kanner, MD  lidocaine  (LIDODERM ) 5 % Place 1 patch onto the skin every 12 (twelve) hours. Remove & Discard patch within 12 hours or as directed by MD 02/03/23 02/03/24  Twilla Galea, MD  loperamide  (IMODIUM ) 2 MG capsule Take 2 capsules (4 mg total) by mouth as needed for diarrhea or loose stools.  08/23/21   Wouk, Haynes Lips, MD  losartan  (COZAAR ) 25 MG tablet Take 1 tablet (25 mg total) by mouth daily. 08/23/21   Wouk, Haynes Lips, MD  lubiprostone  (AMITIZA ) 8 MCG capsule Take 8 mcg by mouth 2 (two) times daily with a meal.    [provider]  methimazole  (TAPAZOLE ) 10 MG tablet Take 2 tablets (20 mg total) by mouth 2 (two) times daily. 10/05/21   Garrison Kanner, MD  ondansetron  (ZOFRAN ) 4 MG tablet Take 1 tablet (4 mg total) by mouth daily as needed for nausea or vomiting. 10/10/22 10/10/23  Buell Carmin, MD  propranolol  (INDERAL ) 80 MG tablet Take 1 tablet (80 mg total) by mouth 3 (three) times daily. 10/05/21  Garrison Kanner, MD  QUEtiapine  (SEROQUEL ) 100 MG tablet Take 300 mg by mouth at bedtime.    [provider]  QUEtiapine  (SEROQUEL ) 50 MG tablet Take 1 tablet (50 mg total) by mouth daily. 01/27/22   Sheril Dines, MD  prochlorperazine  (COMPAZINE ) 25 MG suppository PLACE 1 SUPPOSITORY (25 MG TOTAL) RECTALLY EVERY TWELVE HOURS AS NEEDED FOR NAUSEA OR VOMITING. 02/29/20 02/29/20  Burton Casey, MD     Critical care time: 72 minutes       Eliott Guess, AGACNP-BC Acute Care Nurse Practitioner [facility]   [phone] / [phone] Please see Amion for details.

## 2023-04-30 NOTE — ED Provider Notes (Signed)
 [facility] Provider Note    Event Date/Time   First MD Initiated Contact with Patient 04/30/23 1808     (approximate)  History   Chief Complaint: Seizures  HPI  Nancy [name] is a 23 y.o. female with a past medical history of anxiety, CHF, hypothyroidism, diabetes, seizure disorder on Lamictal  and Keppra  who presents to the emergency department after possible seizure at home.  According to the patient for the last 2 or 3 days she has been having a seizure each day including today.  Patient states she has not missed any of her medications.  In triage patient noted to be tachycardic to around 150 bpm with mild diaphoresis.  Patient was brought back to the emergency department room 12 for evaluation by myself.  Physical Exam   Triage Vital Signs: ED Triage Vitals  Encounter Vitals Group     BP 04/30/23 1757 100/74     Systolic BP Percentile --      Diastolic BP Percentile --      Pulse Rate 04/30/23 1757 (!) 151     Resp 04/30/23 1757 20     Temp 04/30/23 1757 99.2 F (37.3 C)     Temp Source 04/30/23 1757 Oral     SpO2 04/30/23 1757 100 %     Weight 04/30/23 1758 190 lb (86.2 kg)     Height 04/30/23 1758 5\' 3"  (1.6 m)     Head Circumference --      Peak Flow --      Pain Score 04/30/23 1758 9     Pain Loc --      Pain Education --      Exclude from Growth Chart --     Most recent vital signs: Vitals:   04/30/23 1757  BP: 100/74  Pulse: (!) 151  Resp: 20  Temp: 99.2 F (37.3 C)  SpO2: 100%    General: Awake, no distress.  Awake alert answering questions appropriately.  Speaking in full sentences. CV:  Good peripheral perfusion.  Regular rhythm rate around 130 to 140 bpm. Resp:  Normal effort.  Equal breath sounds bilaterally.  No wheeze rales or rhonchi Abd:  No distention.  Soft, nontender.  No rebound or guarding.  ED Results / Procedures / Treatments   EKG  EKG viewed and interpreted by myself shows sinus tachycardia 152 bpm  with a narrow QRS, normal axis, slight QTc prolongation otherwise normal intervals nonspecific ST changes.  Do not agree with computer read of ST elevation.  RADIOLOGY  I have reviewed interpret the chest x-ray images.  No consolidation seen on my evaluation. Chest x-ray read as negative.  MEDICATIONS ORDERED IN ED: Medications - No data to display   IMPRESSION / MDM / ASSESSMENT AND PLAN / ED COURSE  I reviewed the triage vital signs and the nursing notes.  Patient's presentation is most consistent with acute presentation with potential threat to life or bodily function.  Patient presents to the emergency department for seizure-like activity including an episode today.  Patient also states a history of hypothyroidism takes methimazole .  Patient noted to be tachycardic around 150 bpm.  Triage noted the patient to be somewhat diaphoretic although not diaphoretic on my evaluation.  Given the patient's history of hypothyroidism we will check labs including TSH T3 and T4 to evaluate for possible thyroid  storm.  Will dose Ativan  and fluids while awaiting results.  We also check labs including CBC chemistry and a troponin given the  patient's significant tachycardia.  Patient agreeable to plan of care and workup.  Patient continues to complain of pain throughout her body no specific area.  Patient is tachycardic around 150 bpm.  Temperature 99.2 respiratory rate remains mildly tachypneic in the mid 20s.  Patient's chemistry shows no concerning findings, CBC is normal.  Concerning only however patient's TSH is undetectable and her free T4 is elevated over 4 times upper limit of normal.  Patient's troponin has resulted elevated at 771, given the patient is elevated troponin elevated heart rate and respiratory rate there is concern that the patient is currently in a thyroid  storm.  I have ordered PTU, propranolol  and dexamethasone  for the patient.  She is receiving IV fluids.  However given the possible  thyroid  storm I believe patient will require more intensive monitoring possible echocardiogram and more extensive medication management.  I have discussed the patient with the intensive care unit who will be accepting to their service.  I discussed the patient with pharmacy for appropriate PTU dosing.  CRITICAL CARE Performed by: Ruth Cove   Total critical care time: 45 minutes  Critical care time was exclusive of separately billable procedures and treating other patients.  Critical care was necessary to treat or prevent imminent or life-threatening deterioration.  Critical care was time spent personally by me on the following activities: development of treatment plan with patient and/or surrogate as well as nursing, discussions with consultants, evaluation of patient's response to treatment, examination of patient, obtaining history from patient or surrogate, ordering and performing treatments and interventions, ordering and review of laboratory studies, ordering and review of radiographic studies, pulse oximetry and re-evaluation of patient's condition.   FINAL CLINICAL IMPRESSION(S) / ED DIAGNOSES   Seizure activity Tachycardia Thyroid  storm   Note:  This document was prepared using Dragon voice recognition software and may include unintentional dictation errors.   Ruth Cove, MD 04/30/23 2225

## 2023-05-01 ENCOUNTER — Inpatient Hospital Stay

## 2023-05-01 ENCOUNTER — Other Ambulatory Visit: Payer: Self-pay

## 2023-05-01 ENCOUNTER — Encounter: Payer: Self-pay | Admitting: Pulmonary Disease

## 2023-05-01 DIAGNOSIS — R000 Tachycardia, unspecified: Secondary | ICD-10-CM

## 2023-05-01 DIAGNOSIS — R569 Unspecified convulsions: Secondary | ICD-10-CM | POA: Diagnosis not present

## 2023-05-01 LAB — TROPONIN I (HIGH SENSITIVITY)
Troponin I (High Sensitivity): 161 ng/L (ref <18)
Troponin I (High Sensitivity): 132 ng/L (ref <18)

## 2023-05-01 LAB — CBC
HCT: 36.7 % (ref 36.0–46.0)
Hemoglobin: 12 g/dL (ref 12.0–15.0)
MCH: 24.1 pg — ABNORMAL LOW (ref 26.0–34.0)
MCHC: 32.7 g/dL (ref 30.0–36.0)
MCV: 73.8 fL — ABNORMAL LOW (ref 80.0–100.0)
Platelets: 219 10*3/uL (ref 150–400)
RBC: 4.97 MIL/uL (ref 3.87–5.11)
RDW: 13.8 % (ref 11.5–15.5)
WBC: 9.8 10*3/uL (ref 4.0–10.5)
nRBC: 0 % (ref 0.0–0.2)

## 2023-05-01 LAB — URINALYSIS, COMPLETE (UACMP) WITH MICROSCOPIC
Bilirubin Urine: NEGATIVE
Glucose, UA: 150 mg/dL — AB
Hgb urine dipstick: NEGATIVE
Ketones, ur: 20 mg/dL — AB
Leukocytes,Ua: NEGATIVE
Nitrite: NEGATIVE
Protein, ur: NEGATIVE mg/dL
Specific Gravity, Urine: 1.029 (ref 1.005–1.030)
pH: 5 (ref 5.0–8.0)

## 2023-05-01 LAB — COMPREHENSIVE METABOLIC PANEL WITH GFR
ALT: 39 U/L (ref 0–44)
AST: 39 U/L (ref 15–41)
Albumin: 3.8 g/dL (ref 3.5–5.0)
Alkaline Phosphatase: 74 U/L (ref 38–126)
Anion gap: 7 (ref 5–15)
BUN: 17 mg/dL (ref 6–20)
CO2: 21 mmol/L — ABNORMAL LOW (ref 22–32)
Calcium: 9.1 mg/dL (ref 8.9–10.3)
Chloride: 108 mmol/L (ref 98–111)
Creatinine, Ser: 0.56 mg/dL (ref 0.44–1.00)
GFR, Estimated: >60 mL/min (ref >60)
Glucose, Bld: 110 mg/dL — ABNORMAL HIGH (ref 70–99)
Potassium: 4 mmol/L (ref 3.5–5.1)
Sodium: 136 mmol/L (ref 135–145)
Total Bilirubin: 1.3 mg/dL — ABNORMAL HIGH (ref 0.0–1.2)
Total Protein: 7.1 g/dL (ref 6.5–8.1)

## 2023-05-01 LAB — URINE DRUG SCREEN, QUALITATIVE (ARMC ONLY)
Amphetamines, Ur Screen: NOT DETECTED
Barbiturates, Ur Screen: NOT DETECTED
Benzodiazepine, Ur Scrn: NOT DETECTED
Cannabinoid 50 Ng, Ur ~~LOC~~: POSITIVE — AB
Cocaine Metabolite,Ur ~~LOC~~: NOT DETECTED
MDMA (Ecstasy)Ur Screen: NOT DETECTED
Methadone Scn, Ur: NOT DETECTED
Opiate, Ur Screen: POSITIVE — AB
Phencyclidine (PCP) Ur S: NOT DETECTED
Tricyclic, Ur Screen: POSITIVE — AB

## 2023-05-01 LAB — GLUCOSE, CAPILLARY
Glucose-Capillary: 125 mg/dL — ABNORMAL HIGH (ref 70–99)
Glucose-Capillary: 127 mg/dL — ABNORMAL HIGH (ref 70–99)
Glucose-Capillary: 190 mg/dL — ABNORMAL HIGH (ref 70–99)
Glucose-Capillary: 186 mg/dL — ABNORMAL HIGH (ref 70–99)
Glucose-Capillary: 318 mg/dL — ABNORMAL HIGH (ref 70–99)

## 2023-05-01 LAB — CBG MONITORING, ED: Glucose-Capillary: 135 mg/dL — ABNORMAL HIGH (ref 70–99)

## 2023-05-01 LAB — PREGNANCY, URINE: Preg Test, Ur: NEGATIVE

## 2023-05-01 LAB — PHOSPHORUS: Phosphorus: 3.1 mg/dL (ref 2.5–4.6)

## 2023-05-01 LAB — MRSA NEXT GEN BY PCR, NASAL: MRSA by PCR Next Gen: NOT DETECTED

## 2023-05-01 LAB — MAGNESIUM: Magnesium: 2.1 mg/dL (ref 1.7–2.4)

## 2023-05-01 MED ORDER — CHLORHEXIDINE GLUCONATE CLOTH 2 % EX PADS
6.0000 | MEDICATED_PAD | Freq: Every day | CUTANEOUS | Status: DC
  Administered 2023-05-01: 6 via TOPICAL

## 2023-05-01 MED ORDER — POTASSIUM CHLORIDE 10 MEQ/100ML IV SOLN
10.0000 meq | INTRAVENOUS | Status: AC
  Administered 2023-05-01 (×2): 10 meq via INTRAVENOUS
  Filled 2023-05-01 (×3): qty 100

## 2023-05-01 MED ORDER — LIDOCAINE HCL (PF) 1 % IJ SOLN
3.0000 mL | Freq: Once | INTRAMUSCULAR | Status: AC
  Administered 2023-05-01: 3 mL via INTRADERMAL

## 2023-05-01 MED ORDER — LORAZEPAM 2 MG/ML IJ SOLN: 2.0000 mg | INTRAMUSCULAR | Status: DC | PRN

## 2023-05-01 MED ORDER — OXYCODONE HCL 5 MG PO TABS
30.0000 mg | ORAL_TABLET | Freq: Four times a day (QID) | ORAL | Status: DC | PRN
  Administered 2023-05-01 – 2023-05-02 (×4): 30 mg via ORAL
  Filled 2023-05-01 (×4): qty 6

## 2023-05-01 MED ORDER — LOPERAMIDE HCL 2 MG PO CAPS
4.0000 mg | ORAL_CAPSULE | ORAL | Status: DC | PRN
  Administered 2023-05-01: 4 mg via ORAL
  Filled 2023-05-01: qty 2

## 2023-05-01 MED ORDER — INSULIN ASPART 100 UNIT/ML IJ SOLN: 0.0000 [IU] | Freq: Three times a day (TID) | INTRAMUSCULAR | Status: DC

## 2023-05-01 MED ORDER — HYDROMORPHONE HCL 1 MG/ML IJ SOLN
0.5000 mg | INTRAMUSCULAR | Status: DC | PRN
  Administered 2023-05-01 (×2): 1 mg via INTRAVENOUS
  Filled 2023-05-01 (×2): qty 1

## 2023-05-01 MED ORDER — QUETIAPINE FUMARATE 25 MG PO TABS
50.0000 mg | ORAL_TABLET | Freq: Two times a day (BID) | ORAL | Status: DC
  Administered 2023-05-01 – 2023-05-02 (×2): 50 mg via ORAL
  Filled 2023-05-01 (×2): qty 2

## 2023-05-01 MED ORDER — ORAL CARE MOUTH RINSE: 15.0000 mL | OROMUCOSAL | Status: DC | PRN

## 2023-05-01 MED ORDER — HYDROMORPHONE HCL 1 MG/ML IJ SOLN
0.5000 mg | Freq: Once | INTRAMUSCULAR | Status: AC
  Administered 2023-05-01: 0.5 mg via INTRAVENOUS
  Filled 2023-05-01: qty 1

## 2023-05-01 MED ORDER — QUETIAPINE FUMARATE 200 MG PO TABS
400.0000 mg | ORAL_TABLET | Freq: Every day | ORAL | Status: DC
  Administered 2023-05-01: 400 mg via ORAL
  Filled 2023-05-01: qty 2

## 2023-05-01 MED ORDER — OXYCODONE HCL ER 20 MG PO T12A
30.0000 mg | EXTENDED_RELEASE_TABLET | Freq: Two times a day (BID) | ORAL | Status: DC
  Administered 2023-05-01 – 2023-05-02 (×2): 30 mg via ORAL
  Filled 2023-05-01 (×2): qty 1

## 2023-05-01 MED ORDER — INSULIN ASPART 100 UNIT/ML IJ SOLN
0.0000 [IU] | Freq: Three times a day (TID) | INTRAMUSCULAR | Status: DC
  Administered 2023-05-01: 3 [IU] via SUBCUTANEOUS
  Administered 2023-05-01: 11 [IU] via SUBCUTANEOUS
  Administered 2023-05-02: 8 [IU] via SUBCUTANEOUS
  Administered 2023-05-02: 2 [IU] via SUBCUTANEOUS
  Filled 2023-05-01 (×4): qty 1

## 2023-05-01 MED ORDER — DIAZEPAM 5 MG/ML IJ SOLN
5.0000 mg | Freq: Once | INTRAMUSCULAR | Status: AC
  Administered 2023-05-01: 5 mg via INTRAVENOUS
  Filled 2023-05-01: qty 2

## 2023-05-01 MED ORDER — DIPHENHYDRAMINE HCL 50 MG/ML IJ SOLN
50.0000 mg | Freq: Three times a day (TID) | INTRAMUSCULAR | Status: DC
Start: 2023-05-01 — End: 2023-05-03
  Administered 2023-05-01 – 2023-05-02 (×3): 50 mg via INTRAVENOUS
  Filled 2023-05-01 (×3): qty 1

## 2023-05-01 MED ORDER — SULFAMETHOXAZOLE-TRIMETHOPRIM 800-160 MG PO TABS
1.0000 | ORAL_TABLET | Freq: Two times a day (BID) | ORAL | Status: DC
  Administered 2023-05-01 – 2023-05-02 (×2): 1 via ORAL
  Filled 2023-05-01 (×2): qty 1

## 2023-05-01 NOTE — Procedures (Signed)
 Interventional Radiology Procedure Note  Procedure:   US  guided left breast abscess aspirate for culture.  ~3cc of purulent fluid aspirated   Complications: None  Recommendations:  - Surgical referral to breast surgeon if no resolution with conservative measures - routine wound care - follow up culture   Signed,  Marciano Settles. Mabel Savage, DO, ABVM, RPVI

## 2023-05-01 NOTE — Progress Notes (Signed)
 eLink Physician-Brief Progress Note Patient Name: Nancy [name] DOB: [birth year] MRN: [id number]   Date of Service  05/01/2023  HPI/Events of Note  Patient with a known history of Graves's disease who was admitted with symptoms concerning for thyroid  storm, work up and treatment are in progress, she also had seizure-like symptoms but she does have a history of psychogenic non-epileptic seizures.  eICU Interventions  New Patient Evaluation.        Sophiea Ueda U Myka Lukins 05/01/2023, 4:11 AM

## 2023-05-01 NOTE — Progress Notes (Signed)
 PHARMACY CONSULT NOTE - FOLLOW UP  Pharmacy Consult for Electrolyte Monitoring and Replacement   Recent Labs: Potassium (mmol/L)  Date Value  05/01/2023 4.0   Magnesium  (mg/dL)  Date Value  40/10/2723 2.1   Calcium  (mg/dL)  Date Value  36/64/4034 9.1   Albumin (g/dL)  Date Value  74/25/9563 3.8  10/05/2018 4.7   Phosphorus (mg/dL)  Date Value  87/56/4332 3.1   Sodium (mmol/L)  Date Value  05/01/2023 136  10/05/2018 139     Assessment: 23 yo female w/ PMH of Grave's Disease, T1DM, Polysubstance Abuse  presenting to [facility] ED from home via EMS on 04/30/23 for evaluation of seizures /  thyroid  storm   Goal of Therapy:  Electrolytes WNL  Plan:  ---no electrolyte replacement warranted for today ---recheck electrolytes in am  Adalberto Acton ,PharmD Clinical Pharmacist 05/01/2023 7:20 AM

## 2023-05-01 NOTE — Progress Notes (Signed)
 Patient requesting "home" pain medications that were not listed on her chart or Care everywhere notes from most recent providers. In attempt to resolve pain medication reconciliation re-addressed with Pharmacy and NP. Upon review of of patient "home" pain prescriptions listed in her Pharmacy phone application, It was note that the prescriptions were addressed not to the patient but to her mother "Anquinette". Finding was verified by asking patient to allow me to review prescription,Name listed on digital copied of prescription verified as "Anquinette" ICU NP notified of finding

## 2023-05-01 NOTE — ED Notes (Signed)
 Report given to Ogan, Charity fundraiser.  All questions answered, pt ready for transport.

## 2023-05-01 NOTE — Plan of Care (Signed)
  Problem: Education: Goal: Ability to describe self-care measures that may prevent or decrease complications (Diabetes Survival Skills Education) will improve Outcome: Progressing Goal: Individualized Educational Video(s) Outcome: Progressing   Problem: Coping: Goal: Ability to adjust to condition or change in health will improve Outcome: Progressing   Problem: Fluid Volume: Goal: Ability to maintain a balanced intake and output will improve Outcome: Progressing   Problem: Health Behavior/Discharge Planning: Goal: Ability to identify and utilize available resources and services will improve Outcome: Progressing Goal: Ability to manage health-related needs will improve Outcome: Progressing   Problem: Nutritional: Goal: Maintenance of adequate nutrition will improve Outcome: Progressing Goal: Progress toward achieving an optimal weight will improve Outcome: Progressing   Problem: Metabolic: Goal: Ability to maintain appropriate glucose levels will improve Outcome: Progressing   Problem: Skin Integrity: Goal: Risk for impaired skin integrity will decrease Outcome: Progressing

## 2023-05-01 NOTE — Progress Notes (Signed)
 NAME:  Nancy [name], MRN:  [id number], DOB:  2000/08/21, LOS: 1 ADMISSION DATE:  04/30/2023, CONSULTATION DATE:  04/30/23 REFERRING MD:  Dr. Azalee Bolds, CHIEF COMPLAINT:   seizures  History of Present Illness:  23 yo F presenting to [facility] ED from home via EMS on 04/30/23 for evaluation of seizures.  History obtained per patient bedside report and chart review. Patient was in her normal state of health until 04/27/23 when she reports "feeling hot and just not good". On 04/28/23 she began experiencing nausea/ vomiting/ diarrhea, seizures, falls (an estimated 15 times over 4 days) - hitting her head an estimated 5 times. She reports generalized sharp stabbing pain, dyspnea - that she describes as intermittent hyperventilating and headache. She also endorses poor PO intake. She describes knowing her thyroid  was off because when that happens she starts having nausea/ vomiting and seizures.   While getting ready to come to the ED on 04/30/23 the vision in her Right eye "went out". She describes only seeing black, no light and that this was sudden but as not improved. Pupil is reactive to light and she is able to make eye contact without difficulty. She reported previous vision changes similar to this to the EDP, she denied previous vision changes during our discussion.  No other focal deficits reported. She reports chronic neuropathy and migraines & endorses dizziness when changing positions. She denies incontinence, focal weakness, changes to her chronic migraine symptoms, vision changes in Left eye, increased edema, weight changes, chest pain, urinary symptoms, fever/chills, respiratory symptoms.  Of note she has had multiple admissions for uncontrolled hyperthyroidism requiring brief ICU monitoring. Endocrine & PCP saw the patient in Jan 2025, she was scheduled to meet with a surgeon for a thyroidectomy but missed the appointment. She was also started on antibiotics for suspected breast abscess. Patient  stated the left breast mass is still present, however she denies redness/ tenderness/ drainage/ warmth. Reporting she completed her antibiotic course in Jan 2025.  She reports taking medication as prescribed including outpatient regimens of lamictal , methimazole  and propanolol. She admits to occasional ETOH consumption, daily vaping, and THC edibles.   ED course: Upon arrival, patient alert and responsive with tachycardia & diaphoresis. Labs significant for hypokalemia, mild hyponatremia, mild NAGMA, mild leukocytosis Medications given: decadron  2 mg, ativan  1 mg, morphine  8 mg, propanolol 60 mg, PTU 500 mg, 2 L IVF bolus Initial Vitals: 99.2, 151, 20, 100/74 & 100% on RA Significant labs: (Labs/ Imaging personally reviewed) I, Recardo Canal Rust[place], AGACNP-BC, personally viewed and interpreted this ECG. EKG Interpretation: Date: 04/30/23, EKG Time: 18:00, Rate: 152, Rhythm: ST, QRS Axis: normal, Intervals: prolonged QTc, ST/T Wave abnormalities: none, Narrative Interpretation: ST with prolonged Qtc Chemistry: Na+:136, K+: 3.0, BUN/Cr.: 16/ 0.56, Serum CO2/ AG: 20/ 14, AST: 46, T.Bili: 1.7 Hematology: WBC: 11.0, Hgb: 13.5,  Troponin: 771 > 573, Lactic/ PCT: 1.4 / <0.10,  CXR 04/30/23: no active cardiopulmonary disease CT head wo contrast 04/30/23: negative for acute intracranial abnormality CT cervical spine wo contrast 04/30/23: negative for acute displaced fracture  PCCM consulted for admission due to suspected thyroid  storm secondary to uncontrolled hyperthyroidism requiring close monitoring.  05/01/23- patient had few episodes today asking for pain medication.  She had breast biopsy done today.  She is improved and is optimized for TRH transfer.   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  Interim History / Subjective:  Patient alert and responsive. Plan of care discussed, all questions and concerns answered at this time.  Objective   Blood pressure 136/80, pulse (!) 101, temperature 98 F (36.7 C), temperature source Oral, resp. rate 20, height 5\' 3"  (1.6 m), weight 82.4 kg, last menstrual period 04/16/2023, SpO2 100%.        Intake/Output Summary (Last 24 hours) at 05/01/2023 1113 Last data filed at 05/01/2023 0800 Gross per 24 hour  Intake 100.47 ml  Output --  Net 100.47 ml   Filed Weights   04/30/23 1758 05/01/23 0141  Weight: 86.2 kg 82.4 kg    Examination: General: Adult female, acutely ill, lying in bed, NAD HEENT: MM pink/moist, anicteric, atraumatic, neck supple Neuro: A&O x 4, able to follow commands, PERRL +3, MAE CV: s1s2 RRR, ST on monitor, no r/m/g Pulm: Regular, non labored on RA, breath sounds clear-BUL & clear/diminished-BLL GI: soft, rounded, tender to palpation, bs x 4 GU: foley in place with clear yellow urine Skin: palpable mass on Left breast near nipple- non tender, no redness, no drainage Extremities: warm/dry, pulses + 2 R/P, no edema noted  Resolved Hospital Problem list     Assessment & Plan:  Suspected Breakthrough Seizures Right eye vision changes without additional focal deficits Pt has had multiple EEG's and continuous EEG monitoring findings were no evidence of seizures or epileptiform activity identified captured only psychogenic non-epileptic spells. - STAT CT head wo contrast - EEG ordered - ativan  IV PRN for seizure activity - UDS pending - Lamictal  level pending > continue outpatient Lamictal  regimen 100 mg TID - seizure precautions - consult neurology PRN depending on w/u above  Thyroid  Storm  secondary to uncontrolled Hyperthyroidism in the setting of Graves Disease Patient loaded with 60 mg of propanolol and 500 mg of PTU in ED - continue 250 mg of PTU Q 4 for 2 more doses - start outpatient regimen of methimazole  30 mg BID tomorrow - Lugol's 0.2 mL TID - decadron  4 mg Q 8  Elevated Troponin secondary to demand ischemia Chronic HFpEF without exacerbation PMHx: HTN - Echocardiogram ordered - Hold Losartan , consider restarting as patient stabilizes  - Continuous cardiac monitoring  - Strict I/O's: alert provider if UOP < 0.5 mL/kg/hr - Daily weights to assess volume status - Diurese with the use of IV lasix  - as renal function and hemodynamics allow - Supplemental oxygen as needed, maintain SpO2 > 90%  Hypokalemia Mild Hyponatremia Mild NAGMA - Daily BMP, replace electrolytes PRN - K+ supplementation - IVF resuscitation  Mildly Elevated AST Nausea/Vomiting - zofran  PRN - trend hepatic function - consider CT abdomen/pelvis PRN  Type 1 Diabetes Mellitus At risk for Steroid Induced Hyperglycemia - Monitor CBG Q 4 hours - SSI moderate dosing - target range while in ICU: 140-180 - follow ICU hyper/hypo-glycemia protocol - continue glargine, reduce to 6 units at bedtime while NPO  Frequent Falls - fall precautions - echo ordered  - STAT CT cervical spine wo contrast  Left Breast Mass PCT negative, suspect mild leukocytosis reactive - trend PCT, monitor WBC/fever curve - US  left breast ordered  Best Practice (right click and "Reselect all SmartList Selections" daily)  Diet/type: NPO w/ oral meds DVT prophylaxis LMWH Pressure ulcer(s): N/A GI prophylaxis: PPI Lines: N/A  Foley:  N/A Code Status:  full code Last date of multidisciplinary goals of care discussion [04/30/23]  Labs   CBC: Recent Labs  Lab 04/30/23 2014 05/01/23 0620  WBC 11.0* 9.8  HGB 13.5 12.0  HCT 40.5 36.7  MCV 72.5* 73.8*  PLT 255 219    Basic Metabolic Panel: Recent Labs   Lab 04/30/23 2014 05/01/23 0620  NA 136 136  K 3.0* 4.0  CL 102 108  CO2 20* 21*  GLUCOSE 168* 110*  BUN 16 17  CREATININE 0.56 0.56  CALCIUM  9.2 9.1  MG 2.0 2.1  PHOS 3.9 3.1   GFR: Estimated Creatinine Clearance: 110.2 mL/min (by C-G formula based on SCr of 0.56 mg/dL). Recent Labs  Lab 04/30/23 2014 04/30/23 2247 05/01/23 0620  PROCALCITON <0.10  --   --   WBC 11.0*  --  9.8  LATICACIDVEN  --  1.4  --     Liver Function Tests: Recent Labs  Lab 04/30/23 2014 05/01/23 0620  AST 46* 39  ALT 38 39  ALKPHOS 83 74  BILITOT 1.7* 1.3*  PROT 7.2 7.1  ALBUMIN 3.8 3.8   No results for input(s): "LIPASE", "AMYLASE" in the last 168 hours. No results for input(s): "AMMONIA" in the last 168 hours.  ABG    Component Value Date/Time   PHART 7.402 12/01/2018 0120   PCO2ART 34.2 12/01/2018 0120   PO2ART 84.0 12/01/2018 0120   HCO3 28.0 07/27/2022 0830   TCO2 29 04/10/2020 1105   ACIDBASEDEF 4.4 (H) 08/06/2019 1335   O2SAT 91.9 07/27/2022 0830     Coagulation Profile: No results for input(s): "INR", "PROTIME" in the last 168 hours.  Cardiac Enzymes: No results for input(s): "CKTOTAL", "CKMB", "CKMBINDEX", "TROPONINI" in the last 168 hours.  HbA1C: Hgb A1c MFr Bld  Date/Time Value Ref Range Status  03/06/2023 08:59 PM 5.8 (H) 4.8 - 5.6 % Final    Comment:    (NOTE) Pre diabetes:          5.7%-6.4%  Diabetes:              >6.4%  Glycemic control for   <7.0% adults with diabetes   07/27/2022 08:09 AM 6.1 (H) 4.8 - 5.6 % Final    Comment:    (NOTE)         Prediabetes: 5.7 - 6.4         Diabetes: >6.4         Glycemic control for adults with diabetes: <7.0     CBG: Recent Labs  Lab 04/30/23 2342 05/01/23 0149 05/01/23 0341 05/01/23 0728  GLUCAP 122* 135* 125* 127*    Review of Systems: Positives in BOLD  Gen: Denies fever, chills, weight change, fatigue, night sweats HEENT: Denies vision changes R eye, blurred vision, double vision, hearing  loss, tinnitus, sinus congestion, rhinorrhea, sore throat, neck stiffness, dysphagia PULM: Denies shortness of breath, cough, sputum production, hemoptysis, wheezing CV: Denies chest pain, edema, orthopnea, paroxysmal nocturnal dyspnea, palpitations GI: Denies abdominal pain, nausea, vomiting, diarrhea, hematochezia, melena, constipation, change in bowel habits GU: Denies dysuria, hematuria, polyuria, oliguria, urethral discharge Endocrine: Denies hot or cold intolerance, polyuria, polyphagia or appetite change Derm: Denies rash, dry skin, scaling or peeling skin change Heme: Denies easy bruising, bleeding, bleeding gums Neuro: Denies headache, seizures, numbness, weakness, slurred speech, loss of memory or consciousness  Past Medical History:  She,  has a past medical history of Acanthosis nigricans, Anxiety, CHF (congestive heart failure) (HCC), Chronic lower back pain,  Depression, DKA, type 1 (HCC) (09/13/2018), Dyspepsia, Obesity, Ovarian cyst, Precocious adrenarche (HCC), Premature baby, Seizures (HCC), and Type II diabetes mellitus (HCC).   Surgical History:   Past Surgical History:  Procedure Laterality Date   ABDOMINAL HERNIA REPAIR     "I was a baby"   BIOPSY  10/12/2018   Procedure: BIOPSY;  Surgeon: Lajuan Pila, MD;  Location: [facility] ENDOSCOPY;  Service: Endoscopy;;   BIOPSY  02/28/2020   Procedure: BIOPSY;  Surgeon: Annis Kinder, DO;  Location: [facility] ENDOSCOPY;  Service: Gastroenterology;;   ESOPHAGOGASTRODUODENOSCOPY (EGD) WITH PROPOFOL  N/A 10/12/2018   Procedure: ESOPHAGOGASTRODUODENOSCOPY (EGD) WITH PROPOFOL ;  Surgeon: Lajuan Pila, MD;  Location: [facility] ENDOSCOPY;  Service: Endoscopy;  Laterality: N/A;   ESOPHAGOGASTRODUODENOSCOPY (EGD) WITH PROPOFOL  N/A 02/28/2020   Procedure: ESOPHAGOGASTRODUODENOSCOPY (EGD) WITH PROPOFOL ;  Surgeon: Annis Kinder, DO;  Location: [facility] ENDOSCOPY;  Service: Gastroenterology;  Laterality: N/A;   FLEXIBLE SIGMOIDOSCOPY N/A 02/28/2020   Procedure:  FLEXIBLE SIGMOIDOSCOPY;  Surgeon: Annis Kinder, DO;  Location: [facility] ENDOSCOPY;  Service: Gastroenterology;  Laterality: N/A;   HERNIA REPAIR     LEFT HEART CATH AND CORONARY ANGIOGRAPHY N/A 10/13/2018   Procedure: LEFT HEART CATH AND CORONARY ANGIOGRAPHY;  Surgeon: Odie Benne, MD;  Location: [facility] INVASIVE CV LAB;  Service: Cardiovascular;  Laterality: N/A;   TONSILLECTOMY AND ADENOIDECTOMY     WISDOM TOOTH EXTRACTION  2017     Social History:   reports that she has been smoking e-cigarettes. She has never used smokeless tobacco. She reports that she does not drink alcohol and does not use drugs.   Family History:  Her family history includes Allergic rhinitis in her mother; Asthma in her mother; Cervical cancer in her mother; Colon cancer in her maternal grandfather; Diabetes in her father, mother, paternal grandfather, and paternal grandmother; Eczema in her mother; Hyperlipidemia in her father; Hypertension in her father, maternal grandfather, mother, and paternal aunt; Obesity in her father, mother, paternal grandfather, and paternal grandmother. There is no history of Angioedema, Immunodeficiency, Urticaria, Stomach cancer, or Esophageal cancer.   Allergies Allergies  Allergen Reactions   Oatmeal Anaphylaxis   Tomato Anaphylaxis   Acetaminophen  Other (See Comments)    Avoids due to liver   Ibuprofen  Other (See Comments)    GI MD said to not take this anymore     Home Medications  Prior to Admission medications   Medication Sig Start Date End Date Taking? Authorizing Provider  esomeprazole  (NEXIUM ) 20 MG capsule Take 1 capsule (20 mg total) by mouth daily. 07/28/22 07/28/23  Amin, Sumayya, MD  furosemide  (LASIX ) 20 MG tablet Take 20 mg by mouth daily as needed for fluid or edema.    [provider]  Glucagon  (BAQSIMI  TWO PACK) 3 MG/DOSE POWD Place 1 spray into the nose as needed. 08/23/21   Wouk, Haynes Lips, MD  insulin  glargine (LANTUS ) 100 unit/mL SOPN Inject  20 Units into the skin 2 (two) times daily. Patient taking differently: Inject 60 Units into the skin 2 (two) times daily. 08/23/21   Wouk, Haynes Lips, MD  lamoTRIgine  (LAMICTAL ) 100 MG tablet Take 1 tablet (100 mg total) by mouth in the morning, at noon, and at bedtime. 10/05/21   Garrison Kanner, MD  lidocaine  (LIDODERM ) 5 % Place 1 patch onto the skin every 12 (twelve) hours. Remove & Discard patch within 12 hours or as directed by MD 02/03/23 02/03/24  Twilla Galea, MD  loperamide  (IMODIUM ) 2 MG capsule Take 2 capsules (4 mg total) by mouth  as needed for diarrhea or loose stools. 08/23/21   Wouk, Haynes Lips, MD  losartan  (COZAAR ) 25 MG tablet Take 1 tablet (25 mg total) by mouth daily. 08/23/21   Wouk, Haynes Lips, MD  lubiprostone  (AMITIZA ) 8 MCG capsule Take 8 mcg by mouth 2 (two) times daily with a meal.    [provider]  methimazole  (TAPAZOLE ) 10 MG tablet Take 2 tablets (20 mg total) by mouth 2 (two) times daily. 10/05/21   Garrison Kanner, MD  ondansetron  (ZOFRAN ) 4 MG tablet Take 1 tablet (4 mg total) by mouth daily as needed for nausea or vomiting. 10/10/22 10/10/23  Buell Carmin, MD  propranolol  (INDERAL ) 80 MG tablet Take 1 tablet (80 mg total) by mouth 3 (three) times daily. 10/05/21   Garrison Kanner, MD  QUEtiapine  (SEROQUEL ) 100 MG tablet Take 300 mg by mouth at bedtime.    [provider]  QUEtiapine  (SEROQUEL ) 50 MG tablet Take 1 tablet (50 mg total) by mouth daily. 01/27/22   Sheril Dines, MD  prochlorperazine  (COMPAZINE ) 25 MG suppository PLACE 1 SUPPOSITORY (25 MG TOTAL) RECTALLY EVERY TWELVE HOURS AS NEEDED FOR NAUSEA OR VOMITING. 02/29/20 02/29/20  Ghimire, Estil Heman, MD     Critical care provider statement:   Total critical care time: 33 minutes   Performed by: Jaclynn Mast MD   Critical care time was exclusive of separately billable procedures and treating other patients.   Critical care was necessary to treat or prevent imminent or life-threatening deterioration.    Critical care was time spent personally by me on the following activities: development of treatment plan with patient and/or surrogate as well as nursing, discussions with consultants, evaluation of patient's response to treatment, examination of patient, obtaining history from patient or surrogate, ordering and performing treatments and interventions, ordering and review of laboratory studies, ordering and review of radiographic studies, pulse oximetry and re-evaluation of patient's condition.    Devarious Pavek, M.D.  Pulmonary & Critical Care Medicine

## 2023-05-01 NOTE — Procedures (Signed)
 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 The following electrodes were missing or displaced: none.   Background: The background consists of intermixed alpha and beta activities. There is a well defined posterior dominant rhythm of 9 Hz that attenuates with eye opening. Sleep is recorded with normal appearing structures.   Photic stimulation: Physiologic driving is not performed  EEG Abnormalities: none  Clinical Interpretation: This normal EEG is recorded in the waking and sleep state. There was no seizure or seizure predisposition recorded on this study. Please note that lack of epileptiform activity on EEG does not preclude the possibility of epilepsy.   Ann Keto, MD [facility]   If 7pm- 7am, please page neurology on call as listed in AMION.

## 2023-05-01 NOTE — Progress Notes (Signed)
 Eeg done

## 2023-05-01 NOTE — Plan of Care (Signed)
  Problem: Metabolic: Goal: Ability to maintain appropriate glucose levels will improve Outcome: Progressing   Problem: Coping: Goal: Ability to adjust to condition or change in health will improve Outcome: Progressing   Problem: Metabolic: Goal: Ability to maintain appropriate glucose levels will improve Outcome: Progressing   Problem: Skin Integrity: Goal: Risk for impaired skin integrity will decrease Outcome: Progressing   Problem: Tissue Perfusion: Goal: Adequacy of tissue perfusion will improve Outcome: Progressing   Problem: Pain Managment: Goal: General experience of comfort will improve and/or be controlled Outcome: Progressing   Problem: Safety: Goal: Ability to remain free from injury will improve Outcome: Progressing   Problem: Skin Integrity: Goal: Risk for impaired skin integrity will decrease Outcome: Progressing

## 2023-05-02 ENCOUNTER — Inpatient Hospital Stay (HOSPITAL_COMMUNITY)
Admit: 2023-05-02 | Discharge: 2023-05-02 | Disposition: A | Discharge status: Home / Self Care | Attending: Pulmonary Disease | Admitting: Pulmonary Disease

## 2023-05-02 DIAGNOSIS — R7989 Other specified abnormal findings of blood chemistry: Secondary | ICD-10-CM

## 2023-05-02 DIAGNOSIS — R9431 Abnormal electrocardiogram [ECG] [EKG]: Secondary | ICD-10-CM

## 2023-05-02 DIAGNOSIS — E0581 Other thyrotoxicosis with thyrotoxic crisis or storm: Secondary | ICD-10-CM | POA: Diagnosis not present

## 2023-05-02 LAB — ECHOCARDIOGRAM COMPLETE
Area-P 1/2: 4.36 cm2
Height: 63 in
S' Lateral: 3.4 cm
Weight: 2809.6 [oz_av]

## 2023-05-02 LAB — RENAL FUNCTION PANEL
Albumin: 3.7 g/dL (ref 3.5–5.0)
Anion gap: 5 (ref 5–15)
BUN: 24 mg/dL — ABNORMAL HIGH (ref 6–20)
CO2: 21 mmol/L — ABNORMAL LOW (ref 22–32)
Calcium: 9 mg/dL (ref 8.9–10.3)
Chloride: 105 mmol/L (ref 98–111)
Creatinine, Ser: 0.74 mg/dL (ref 0.44–1.00)
GFR, Estimated: >60 mL/min (ref >60)
Glucose, Bld: 375 mg/dL — ABNORMAL HIGH (ref 70–99)
Phosphorus: 4.4 mg/dL (ref 2.5–4.6)
Potassium: 4.6 mmol/L (ref 3.5–5.1)
Sodium: 131 mmol/L — ABNORMAL LOW (ref 135–145)

## 2023-05-02 LAB — CBC
HCT: 33.9 % — ABNORMAL LOW (ref 36.0–46.0)
Hemoglobin: 11.3 g/dL — ABNORMAL LOW (ref 12.0–15.0)
MCH: 24.7 pg — ABNORMAL LOW (ref 26.0–34.0)
MCHC: 33.3 g/dL (ref 30.0–36.0)
MCV: 74.2 fL — ABNORMAL LOW (ref 80.0–100.0)
Platelets: 190 10*3/uL (ref 150–400)
RBC: 4.57 MIL/uL (ref 3.87–5.11)
RDW: 13.8 % (ref 11.5–15.5)
WBC: 7.6 10*3/uL (ref 4.0–10.5)
nRBC: 0 % (ref 0.0–0.2)

## 2023-05-02 LAB — T3, FREE: T3, Free: 24.6 pg/mL — ABNORMAL HIGH (ref 2.0–4.4)

## 2023-05-02 LAB — MAGNESIUM: Magnesium: 2.2 mg/dL (ref 1.7–2.4)

## 2023-05-02 LAB — GLUCOSE, CAPILLARY
Glucose-Capillary: 262 mg/dL — ABNORMAL HIGH (ref 70–99)
Glucose-Capillary: 122 mg/dL — ABNORMAL HIGH (ref 70–99)

## 2023-05-02 MED ORDER — KETOROLAC TROMETHAMINE 30 MG/ML IJ SOLN: 30.0000 mg | Freq: Four times a day (QID) | INTRAMUSCULAR | Status: DC | PRN

## 2023-05-02 MED ORDER — ENOXAPARIN SODIUM 40 MG/0.4ML IJ SOSY: 40.0000 mg | PREFILLED_SYRINGE | INTRAMUSCULAR | Status: DC

## 2023-05-02 NOTE — Progress Notes (Signed)
 Progress Note   Patient: Nancy [name]:[id number] DOB: 22-Nov-2000 DOA: 04/30/2023     2 DOS: the patient was seen and examined on 05/02/2023   Brief hospital course: 22yo with DM, seizure d/o, and uncontrolled hyperthyroidism who presented with thyroid  storm.  She was admitted to ICU and started on PTU, decadron , and Lugol's.  She has been transitioned back to PO methimazole .  EEG negative for seizures.  Assessment and Plan:  Suspected Breakthrough Seizures Right eye vision changes without additional focal deficits Pt has had multiple EEG's and continuous EEG monitoring findings were no evidence of seizures or epileptiform activity identified captured only psychogenic non-epileptic spells. Negative head CT Negative EEG Ativan  IV PRN for seizure activity UDS positive for TCA, opiates, and cannabinoid Lamictal  level pending > continue outpatient Lamictal  regimen 100 mg TID Seizure precautions   Thyroid  Storm secondary to uncontrolled Hyperthyroidism in the setting of Graves Disease TSH <0.010, T3 24.6, free T4 4.78; increased from 3.22 on 2/27 - ?medication compliance Patient loaded with 60 mg of propanolol and 500 mg of PTU in ED and then treated with 250 mg of PTU Q 4 for 2 more doses Started outpatient regimen of methimazole  30 mg BID today Lugol's 0.2 mL TID Decadron  4 mg Q 8 Appears quite stable at this time   Elevated Troponin secondary to demand ischemia Chronic HFpEF without exacerbation PMHx: HTN Echocardiogram ordered Hold Losartan , consider restarting as patient stabilizes  Diurese with the use of IV lasix  - as renal function and hemodynamics allow Supplemental oxygen as needed, maintain SpO2 > 90%   Mild Hyponatremia Mild NAGMA Daily BMP, replace electrolytes PRN   Type 1 Diabetes Mellitus At risk for Steroid Induced Hyperglycemia Monitor CBG Q 4 hours SSI moderate dosing Continue glargine, reduce to 6 units at bedtime while NPO   Frequent Falls Fall  precautions Echo ordered  CT cervical spine negative   Left Breast Mass US  left breast aspiration ordered Cultures pending Treating with Bactrim  x 5 days Will need outpatient breast surgeon f/u if no resolution with conservative measures  Chronic pain Patient reports being on Xtampza  for chronic pain She showed 2 screenshots of images that were scanned into [employer], purportedly showing rx by Angelique Barer (who is an NP in [place]) last picked up on 2/6 and 2/26 (27 mg, no dosing instructions); another last picked up on 4/1 for Xtampza  36 mg (same prescriber, no dosing instructions); and a prescription for Oxy IR 30 mg tabs from 4/1 PDMP is completely blank and there is no indication whatsoever that she is being actively prescribed narcotics Will stop the Oxy IR and oxycodone  that were prescribed on admission at this time Pain control with Toradol  as needed      Consultants: PCCM IR Neurology   Procedures: Breast cyst aspiration 4/25 EEG 4/25   Antibiotics: Bactrim  x 5 days  30 Day Unplanned Readmission Risk Score    Flowsheet Row ED to Hosp-Admission (Current) from 04/30/2023 in [facility] PCU  30 Day Unplanned Readmission Risk Score (%) 36.22 Filed at 05/02/2023 1200       This score is the patient's risk of an unplanned readmission within 30 days of being discharged (0 -100%). The score is based on dignosis, age, lab data, medications, orders, and past utilization.   Low:  0-14.9   Medium: 15-21.9   High: 22-29.9   Extreme: 30 and above           Subjective: Reported severe chronic pain from  neuropathy, reports seeing pain clinic for care.  She said the pain was terrible however she did not appear to be uncomfortable and then actually came out of the room shortly after I left to ask me another question and was ambulating without apparent difficulty independently.  Reports that she needs another day of hospitalization.  Reports breast discomfort  and wants to know follow up plan.   Objective: Vitals:   05/02/23 0817 05/02/23 1158  BP: (!) 144/81 137/73  Pulse: 90 86  Resp: 16 16  Temp: (!) 97.4 F (36.3 C) (!) 97.5 F (36.4 C)  SpO2: 100% 100%    Intake/Output Summary (Last 24 hours) at 05/02/2023 1325 Last data filed at 05/02/2023 0900 Gross per 24 hour  Intake 100 ml  Output --  Net 100 ml   Filed Weights   05/01/23 0141 05/01/23 2018 05/02/23 0510  Weight: 82.4 kg 78.1 kg 79.7 kg    Exam:  General:  Appears calm and comfortable and is in NAD Eyes:  EOMI, normal lids, iris ENT:  grossly normal hearing, lips & tongue, mmm Cardiovascular:  RRR, no m/r/g. No LE edema.  Respiratory:   CTA bilaterally with no wheezes/rales/rhonchi.  Normal respiratory effort. Abdomen:  soft, NT, ND Skin:  no rash or induration seen on limited exam Musculoskeletal:  grossly normal tone BUE/BLE, good ROM, no bony abnormality Psychiatric:  grossly normal mood and affect, speech fluent and appropriate, AOx3 Neurologic:  CN 2-12 grossly intact, moves all extremities in coordinated fashion  Data Reviewed: I have reviewed the patient's lab results since admission.  Pertinent labs for today include:   Na++ 131 Glucose 375 WBC 7.6 Hgb 11.3 Breast culture pending, no orgs seen on gram stain     Family Communication: None present  Disposition: Status is: Inpatient Remains inpatient appropriate because: ongoing monitoring     Time spent: 50 minutes  Unresulted Labs (From admission, onward)     Start     Ordered   05/02/23 0500  CBC  Daily,   R      05/01/23 0748   05/02/23 0500  Renal function panel  Daily,   R      05/01/23 0748   04/30/23 2301  Lamotrigine  level  Once,   R        04/30/23 2301             Author: Lorita Rosa, MD 05/02/2023 1:25 PM  For on call review [email or web address].

## 2023-05-02 NOTE — Discharge Summary (Signed)
 Physician Discharge Summary   Patient: Nancy [name] MRN: [id number] DOB: [birth year]  Admit date:     04/30/2023  Discharge date: 05/02/23  Discharge Physician: Lorita Rosa   Discharge Diagnoses:   Principal Problem:   Thyroid  storm   Discharge Condition: patient left AMA  See today's note for details.  In short, admitted for thyroid  storm to ICU.  Transferred to the floor and in review of medications it appears that the patient showed factitious evidence of controlled substance prescriptions to get narcotics.   When called on this, she left AMA without hesitation and very quickly (almost too quickly for us  to pull out her IV).    Significant Diagnostic Studies: US  FINE NEEDLE ASP 1ST LESION Result Date: 05/01/2023 INDICATION: 23 year old female presents for aspiration breast abscess EXAM: ULTRASOUND-GUIDED ASPIRATION BREAST ABSCESS MEDICATIONS: None ANESTHESIA/SEDATION: Moderate (conscious) sedation was not employed during this procedure. COMPLICATIONS: None PROCEDURE: Informed written consent was obtained from the patient after a thorough discussion of the procedural risks, benefits and alternatives. All questions were addressed. Maximal Sterile Barrier Technique was utilized including caps, mask, sterile gowns, sterile gloves, sterile drape, hand hygiene and skin antiseptic. A timeout was performed prior to the initiation of the procedure. Ultrasound survey was performed with images stored and sent to PACs. The left breast was prepped with chlorhexidine  in a sterile fashion, and a sterile drape was applied covering the operative field. A sterile gown and sterile gloves were used for the procedure. Local anesthesia was provided with 1% Lidocaine . Ultrasound guidance was used to infiltrate the region with 1% lidocaine  for local anesthesia. Once local anesthesia was applied, 18 gauge needle was advanced under ultrasound guidance into the abscess. Aspiration proximally 3-4 cc of frankly  purulent material was performed sample sent for culture. The density precluded complete evacuation with the 18 gauge needle. Needle was removed and a final image was stored. Sterile bandage placed. Patient tolerated the procedure well and remained hemodynamically stable throughout. No complications were encountered and no significant blood loss was encounter IMPRESSION: Status post ultrasound-guided aspiration of left breast abscess. Signed, Marciano Settles. Rexine Cater, RPVI [facility] Electronically Signed   By: Myrlene Asper D.O.   On: 05/01/2023 16:17   US  BREAST COMPLETE UNI LEFT INC AXILLA Result Date: 05/01/2023 CLINICAL DATA:  23 year old female with a palpable area of concern in the left breast. She reports history of right breast abscesses, previously treated with antibiotics. EXAM: ULTRASOUND OF THE LEFT BREAST COMPARISON:  None available. FINDINGS: Per performing technologist, on physical exam, there is a superficial raised area along the medial aspect of the left breast, with associated erythema. Targeted left breast ultrasound was performed in the area of palpable concern. At 9 o'clock 2 cm from nipple, there is a superficial heterogeneous fluid collection within the subcuticular soft tissue, measuring 4.3 x 0.8 x 2.7 cm. There is surrounding hyperemia. IMPRESSION: The palpable area of concern in the left breast is most consistent with a superficial skin abscess/cellulitis. Recommend further assessment with ultrasound-guided aspiration, with fluid sent for microbiology. RECOMMENDATION: Left breast ultrasound-guided aspiration. BI-RADS CATEGORY  2: Benign. Electronically Signed   By: Sande Cromer M.D.   On: 05/01/2023 14:24   CT HEAD WO CONTRAST ( ) Result Date: 05/01/2023 CLINICAL DATA:  Headache, neuro deficit; Neck trauma, focal neuro deficit or paresthesia (Age 3-64y) EXAM: CT HEAD WITHOUT CONTRAST CT CERVICAL SPINE WITHOUT  CONTRAST TECHNIQUE: Multidetector CT imaging of the head and cervical spine was performed  following the standard protocol without intravenous contrast. Multiplanar CT image reconstructions of the cervical spine were also generated. RADIATION DOSE REDUCTION: This exam was performed according to the departmental dose-optimization program which includes automated exposure control, adjustment of the mA and/or kV according to patient size and/or use of iterative reconstruction technique. COMPARISON:  CT head C-spine 01/25/2022, ultrasound thyroid  08/21/2021 FINDINGS: CT HEAD FINDINGS Brain: No evidence of large-territorial acute infarction. No parenchymal hemorrhage. No mass lesion. No extra-axial collection. No mass effect or midline shift. No hydrocephalus. Basilar cisterns are patent. Vascular: No hyperdense vessel. Skull: No acute fracture or focal lesion. Sinuses/Orbits: Paranasal sinuses and mastoid air cells are clear. The orbits are unremarkable. Other: None. CT CERVICAL SPINE FINDINGS Alignment: Normal. Skull base and vertebrae: No acute fracture. No aggressive appearing focal osseous lesion or focal pathologic process. Soft tissues and spinal canal: No prevertebral fluid or swelling. No visible canal hematoma. Upper chest: Unremarkable. Other: Prominent thyroid  gland. This has been evaluated on previous imaging. (ref: J Am Coll Radiol. 2015 Feb;12(2): 143-50). IMPRESSION: 1. No acute intracranial abnormality. 2. No acute displaced fracture or traumatic listhesis of the cervical spine. Electronically Signed   By: Morgane  Naveau M.D.   On: 05/01/2023 00:07   CT CERVICAL SPINE WO CONTRAST Result Date: 05/01/2023 CLINICAL DATA:  Headache, neuro deficit; Neck trauma, focal neuro deficit or paresthesia (Age 11-64y) EXAM: CT HEAD WITHOUT CONTRAST CT CERVICAL SPINE WITHOUT CONTRAST TECHNIQUE: Multidetector CT imaging of the head and cervical spine was performed following the standard protocol without intravenous  contrast. Multiplanar CT image reconstructions of the cervical spine were also generated. RADIATION DOSE REDUCTION: This exam was performed according to the departmental dose-optimization program which includes automated exposure control, adjustment of the mA and/or kV according to patient size and/or use of iterative reconstruction technique. COMPARISON:  CT head C-spine 01/25/2022, ultrasound thyroid  08/21/2021 FINDINGS: CT HEAD FINDINGS Brain: No evidence of large-territorial acute infarction. No parenchymal hemorrhage. No mass lesion. No extra-axial collection. No mass effect or midline shift. No hydrocephalus. Basilar cisterns are patent. Vascular: No hyperdense vessel. Skull: No acute fracture or focal lesion. Sinuses/Orbits: Paranasal sinuses and mastoid air cells are clear. The orbits are unremarkable. Other: None. CT CERVICAL SPINE FINDINGS Alignment: Normal. Skull base and vertebrae: No acute fracture. No aggressive appearing focal osseous lesion or focal pathologic process. Soft tissues and spinal canal: No prevertebral fluid or swelling. No visible canal hematoma. Upper chest: Unremarkable. Other: Prominent thyroid  gland. This has been evaluated on previous imaging. (ref: J Am Coll Radiol. 2015 Feb;12(2): 143-50). IMPRESSION: 1. No acute intracranial abnormality. 2. No acute displaced fracture or traumatic listhesis of the cervical spine. Electronically Signed   By: Morgane  Naveau M.D.   On: 05/01/2023 00:07   DG Chest 2 View Result Date: 04/30/2023 CLINICAL DATA:  Seizure, tachycardia. EXAM: CHEST - 2 VIEW COMPARISON:  July 27, 2022. FINDINGS: The heart size and mediastinal contours are within normal limits. Both lungs are clear. The visualized skeletal structures are unremarkable. IMPRESSION: No active cardiopulmonary disease. Electronically Signed   By: Rosalene Colon M.D.   On: 04/30/2023 18:28    Microbiology: Recent Results (from the past 240 hours)  MRSA Next Gen by PCR, Nasal      Status: None   Collection Time: 05/01/23  4:00 AM   Specimen: Nasal Mucosa; Nasal Swab  Result Value Ref Range Status   MRSA by PCR Next Gen NOT DETECTED NOT DETECTED Final    Comment: (NOTE) The GeneXpert MRSA Assay (  FDA approved for NASAL specimens only), is one component of a comprehensive MRSA colonization surveillance program. It is not intended to diagnose MRSA infection nor to guide or monitor treatment for MRSA infections. Test performance is not FDA approved in patients less than 102 years old. Performed at [facility], [address] [place], [place] [address]   Aerobic/Anaerobic Culture w Gram Stain (surgical/deep wound)     Status: None (Preliminary result)   Collection Time: 05/01/23  4:10 PM   Specimen: Abscess  Result Value Ref Range Status   Specimen Description   Final    ABSCESS Performed at [facility], [address] [place], [place] [address]    Special Requests   Final    LEFT BREAST Performed at [facility], [address] [place], [place] [address]    Gram Stain   Final    MODERATE WBC PRESENT,BOTH PMN AND MONONUCLEAR NO ORGANISMS SEEN    Culture   Final    CULTURE REINCUBATED FOR BETTER GROWTH Performed at [facility], [address] [place], [place] [address]    Report Status PENDING  Incomplete     Labs: CBC: Recent Labs  Lab 04/30/23 2014 05/01/23 0620 05/02/23 0430  WBC 11.0* 9.8 7.6  HGB 13.5 12.0 11.3*  HCT 40.5 36.7 33.9*  MCV 72.5* 73.8* 74.2*  PLT 255 219 190   Basic Metabolic Panel: Recent Labs  Lab 04/30/23 2014 05/01/23 0620 05/02/23 0430  NA 136 136 131*  K 3.0* 4.0 4.6  CL 102 108 105  CO2 20* 21* 21*  GLUCOSE 168* 110* 375*  BUN 16 17 24*  CREATININE 0.56 0.56 0.74  CALCIUM  9.2 9.1 9.0  MG 2.0 2.1 2.2  PHOS 3.9 3.1 4.4   Liver Function Tests: Recent Labs  Lab 04/30/23 2014 05/01/23 0620 05/02/23 0430  AST 46* 39  --   ALT 38 39  --   ALKPHOS 83 74  --    BILITOT 1.7* 1.3*  --   PROT 7.2 7.1  --   ALBUMIN 3.8 3.8 3.7   No results for input(s): "LIPASE", "AMYLASE" in the last 168 hours. No results for input(s): "AMMONIA" in the last 168 hours. Cardiac Enzymes: No results for input(s): "CKTOTAL", "CKMB", "CKMBINDEX", "TROPONINI" in the last 168 hours. BNP (last 3 results) No results for input(s): "BNP" in the last 8760 hours. CBG: Recent Labs  Lab 05/01/23 1219 05/01/23 1636 05/01/23 2114 05/02/23 0814 05/02/23 1200  GLUCAP 190* 186* 318* 262* 122*     Signed:  Lorita Rosa  [facility] 05/02/2023, 2:07 PM

## 2023-05-02 NOTE — Progress Notes (Addendum)
 Pt. left AMA. Provider Dr. Murrel Arnt notified. Piv removed and AMA paper sign

## 2023-05-02 NOTE — Hospital Course (Addendum)
 22yo with DM, seizure d/o, and uncontrolled hyperthyroidism who presented with thyroid  storm.  She was admitted to ICU and started on PTU, decadron , and Lugol's.  She has been transitioned back to PO methimazole .  EEG negative for seizures.

## 2023-05-02 NOTE — Progress Notes (Signed)
  Echocardiogram 2D Echocardiogram has been performed.  Nancy [name] 05/02/2023, 1:27 PM

## 2023-05-03 ENCOUNTER — Observation Stay
Admission: EM | Admit: 2023-05-03 | Discharge: 2023-05-04 | Disposition: A | Discharge status: Home / Self Care | Attending: Obstetrics and Gynecology | Admitting: Obstetrics and Gynecology

## 2023-05-03 ENCOUNTER — Other Ambulatory Visit: Payer: Self-pay

## 2023-05-03 ENCOUNTER — Emergency Department

## 2023-05-03 DIAGNOSIS — R55 Syncope and collapse: Secondary | ICD-10-CM

## 2023-05-03 DIAGNOSIS — R29818 Other symptoms and signs involving the nervous system: Principal | ICD-10-CM | POA: Insufficient documentation

## 2023-05-03 DIAGNOSIS — I509 Heart failure, unspecified: Secondary | ICD-10-CM | POA: Insufficient documentation

## 2023-05-03 DIAGNOSIS — Z794 Long term (current) use of insulin: Secondary | ICD-10-CM | POA: Insufficient documentation

## 2023-05-03 DIAGNOSIS — F1910 Other psychoactive substance abuse, uncomplicated: Secondary | ICD-10-CM | POA: Diagnosis not present

## 2023-05-03 DIAGNOSIS — R9431 Abnormal electrocardiogram [ECG] [EKG]: Secondary | ICD-10-CM | POA: Diagnosis not present

## 2023-05-03 DIAGNOSIS — Z87898 Personal history of other specified conditions: Secondary | ICD-10-CM

## 2023-05-03 DIAGNOSIS — Z79899 Other long term (current) drug therapy: Secondary | ICD-10-CM | POA: Diagnosis not present

## 2023-05-03 DIAGNOSIS — E118 Type 2 diabetes mellitus with unspecified complications: Secondary | ICD-10-CM | POA: Diagnosis not present

## 2023-05-03 DIAGNOSIS — R569 Unspecified convulsions: Principal | ICD-10-CM

## 2023-05-03 DIAGNOSIS — F17290 Nicotine dependence, other tobacco product, uncomplicated: Secondary | ICD-10-CM | POA: Insufficient documentation

## 2023-05-03 DIAGNOSIS — G40919 Epilepsy, unspecified, intractable, without status epilepticus: Secondary | ICD-10-CM | POA: Diagnosis present

## 2023-05-03 DIAGNOSIS — H539 Unspecified visual disturbance: Secondary | ICD-10-CM

## 2023-05-03 DIAGNOSIS — G8929 Other chronic pain: Secondary | ICD-10-CM | POA: Diagnosis not present

## 2023-05-03 DIAGNOSIS — E669 Obesity, unspecified: Secondary | ICD-10-CM | POA: Diagnosis present

## 2023-05-03 DIAGNOSIS — E109 Type 1 diabetes mellitus without complications: Secondary | ICD-10-CM | POA: Diagnosis present

## 2023-05-03 DIAGNOSIS — E0590 Thyrotoxicosis, unspecified without thyrotoxic crisis or storm: Secondary | ICD-10-CM | POA: Diagnosis not present

## 2023-05-03 DIAGNOSIS — E119 Type 2 diabetes mellitus without complications: Secondary | ICD-10-CM

## 2023-05-03 LAB — COMPREHENSIVE METABOLIC PANEL WITH GFR
ALT: 28 U/L (ref 0–44)
AST: 21 U/L (ref 15–41)
Albumin: 3.8 g/dL (ref 3.5–5.0)
Alkaline Phosphatase: 65 U/L (ref 38–126)
Anion gap: 9 (ref 5–15)
BUN: 19 mg/dL (ref 6–20)
CO2: 24 mmol/L (ref 22–32)
Calcium: 8.9 mg/dL (ref 8.9–10.3)
Chloride: 105 mmol/L (ref 98–111)
Creatinine, Ser: 0.65 mg/dL (ref 0.44–1.00)
GFR, Estimated: >60 mL/min (ref >60)
Glucose, Bld: 163 mg/dL — ABNORMAL HIGH (ref 70–99)
Potassium: 3.5 mmol/L (ref 3.5–5.1)
Sodium: 138 mmol/L (ref 135–145)
Total Bilirubin: 0.7 mg/dL (ref 0.0–1.2)
Total Protein: 6.7 g/dL (ref 6.5–8.1)

## 2023-05-03 LAB — CBC WITH DIFFERENTIAL/PLATELET
Abs Immature Granulocytes: 0.03 10*3/uL (ref 0.00–0.07)
Basophils Absolute: 0 10*3/uL (ref 0.0–0.1)
Basophils Relative: 0 %
Eosinophils Absolute: 0.1 10*3/uL (ref 0.0–0.5)
Eosinophils Relative: 1 %
HCT: 37.7 % (ref 36.0–46.0)
Hemoglobin: 12.1 g/dL (ref 12.0–15.0)
Immature Granulocytes: 0 %
Lymphocytes Relative: 57 %
Lymphs Abs: 6.6 10*3/uL — ABNORMAL HIGH (ref 0.7–4.0)
MCH: 24.2 pg — ABNORMAL LOW (ref 26.0–34.0)
MCHC: 32.1 g/dL (ref 30.0–36.0)
MCV: 75.2 fL — ABNORMAL LOW (ref 80.0–100.0)
Monocytes Absolute: 1.2 10*3/uL — ABNORMAL HIGH (ref 0.1–1.0)
Monocytes Relative: 10 %
Neutro Abs: 3.6 10*3/uL (ref 1.7–7.7)
Neutrophils Relative %: 32 %
Platelets: 218 10*3/uL (ref 150–400)
RBC: 5.01 MIL/uL (ref 3.87–5.11)
RDW: 13.5 % (ref 11.5–15.5)
Smear Review: NORMAL
WBC: 11.5 10*3/uL — ABNORMAL HIGH (ref 4.0–10.5)
nRBC: 0 % (ref 0.0–0.2)

## 2023-05-03 LAB — T4, FREE: Free T4: 1.87 ng/dL — ABNORMAL HIGH (ref 0.61–1.12)

## 2023-05-03 LAB — TSH: TSH: <0.01 u[IU]/mL — ABNORMAL LOW (ref 0.350–4.500)

## 2023-05-03 LAB — TROPONIN I (HIGH SENSITIVITY): Troponin I (High Sensitivity): 76 ng/L — ABNORMAL HIGH (ref <18)

## 2023-05-03 LAB — HCG, QUANTITATIVE, PREGNANCY: hCG, Beta Chain, Quant, S: <1 m[IU]/mL (ref <5)

## 2023-05-03 MED ORDER — KETOROLAC TROMETHAMINE 30 MG/ML IJ SOLN
15.0000 mg | Freq: Once | INTRAMUSCULAR | Status: AC
  Administered 2023-05-03: 15 mg via INTRAVENOUS
  Filled 2023-05-03: qty 1

## 2023-05-03 NOTE — ED Triage Notes (Signed)
 Per EMS  Seizure Unknown downtime 2. Abdomen pain 3. Headache

## 2023-05-03 NOTE — ED Provider Notes (Signed)
 [facility] Provider Note    Event Date/Time   First MD Initiated Contact with Patient 05/03/23 2143     (approximate)   History   Chief Complaint Seizures, Headache, and Abdominal Pain   HPI  Nancy [name] is a 23 y.o. female with past medical history of hypertension, diabetes, CHF, hypothyroidism, and PNES who presents to the ED complaining of seizures.  Patient reports that she has had multiple seizures at home today despite taking her usually prescribed Lamictal .  She states that she typically has generalized tonic-clonic seizures and is concerned that she fell to the ground and hit her head today.  She complains of some headache and neck pain, denies any vision changes, speech changes, numbness, or weakness in her extremities.     Physical Exam   Triage Vital Signs: ED Triage Vitals  Encounter Vitals Group     BP 05/03/23 2144 (!) 142/88     Systolic BP Percentile --      Diastolic BP Percentile --      Pulse Rate 05/03/23 2144 99     Resp 05/03/23 2144 18     Temp 05/03/23 2147 98.5 F (36.9 C)     Temp src --      SpO2 05/03/23 2144 100 %     Weight 05/03/23 2143 189 lb (85.7 kg)     Height 05/03/23 2143 5\' 3"  (1.6 m)     Head Circumference --      Peak Flow --      Pain Score 05/03/23 2143 10     Pain Loc --      Pain Education --      Exclude from Growth Chart --     Most recent vital signs: Vitals:   05/03/23 2230 05/03/23 2300  BP: 123/83 (!) 116/57  Pulse: (!) 108 91  Resp:  (!) 23  Temp:    SpO2: 100% 100%    Constitutional: Alert and oriented. Eyes: Conjunctivae are normal. Head: Atraumatic. Nose: No congestion/rhinnorhea. Mouth/Throat: Mucous membranes are moist.  Neck: Midline cervical spine tenderness to palpation noted. Cardiovascular: Normal rate, regular rhythm. Grossly normal heart sounds.  2+ radial pulses bilaterally. Respiratory: Normal respiratory effort.  No retractions. Lungs CTAB. Gastrointestinal:  Soft and nontender. No distention. Musculoskeletal: No lower extremity tenderness nor edema.  Neurologic:  Normal speech and language. No gross focal neurologic deficits are appreciated.    ED Results / Procedures / Treatments   Labs (all labs ordered are listed, but only abnormal results are displayed) Labs Reviewed  CBC WITH DIFFERENTIAL/PLATELET - Abnormal; Notable for the following components:      Result Value   WBC 11.5 (*)    MCV 75.2 (*)    MCH 24.2 (*)    Lymphs Abs 6.6 (*)    Monocytes Absolute 1.2 (*)    All other components within normal limits  HCG, QUANTITATIVE, PREGNANCY  COMPREHENSIVE METABOLIC PANEL WITH GFR  TSH  T4, FREE  TROPONIN I (HIGH SENSITIVITY)  TROPONIN I (HIGH SENSITIVITY)     EKG  ED ECG REPORT I, Twilla Galea, the attending physician, personally viewed and interpreted this ECG.   Date: 05/03/2023  EKG Time: 21:47  Rate: 103  Rhythm: sinus tachycardia  Axis: Normal  Intervals: Prolonged QT  ST&T Change: Diffuse mild ST elevation, similar to previous  RADIOLOGY CT head reviewed and interpreted by me with no hemorrhage or midline shift.  PROCEDURES:  Critical Care performed: No  Procedures  MEDICATIONS ORDERED IN ED: Medications  ketorolac  (TORADOL ) 30 MG/ML injection 15 mg (has no administration in time range)     IMPRESSION / MDM / ASSESSMENT AND PLAN / ED COURSE  I reviewed the triage vital signs and the nursing notes.                              23 y.o. female with past medical history of hypertension, diabetes, CHF, hypothyroidism, and PNES who presents to the ED following multiple reported seizure episodes at home.  Patient's presentation is most consistent with acute presentation with potential threat to life or bodily function.  Differential diagnosis includes, but is not limited to, seizure, pseudoseizure, intracranial injury, cervical spine injury, arrhythmia, anemia, electrolyte abnormality, AKI,  hyperthyroidism.  Patient nontoxic-appearing and in no acute distress, vital signs are unremarkable.  EKG shows no evidence of arrhythmia or ischemia, CT head and cervical spine are negative for acute process.  Lab results are pending at this time, patient did leave the hospital [facility] yesterday following admission for thyroid  storm.  We will repeat thyroid  panel in addition to other labs.  Patient did have a seizure-like episode here in the ED that was witnessed by myself, no postictal period noted and I suspect pseudoseizure.  She had a EEG during her most recent admission with no epileptiform activity and patient has documented history of pseudoseizures.  Patient turned over to oncoming provider pending lab results and likely readmission to the hospital.  It does appear patient had issues with pain medication seeking behavior and I have informed her that if she is admitted back to the hospital, we will avoid narcotic pain medication.  Patient agreeable to this and will give dose of IV Toradol  for pain now.      FINAL CLINICAL IMPRESSION(S) / ED DIAGNOSES   Final diagnoses:  Seizure-like activity (HCC)     Rx / DC Orders   ED Discharge Orders     None        Note:  This document was prepared using Dragon voice recognition software and may include unintentional dictation errors.   Twilla Galea, MD 05/03/23 [phone]

## 2023-05-03 NOTE — ED Notes (Signed)
 Notified by a registation member that patient was having a seizure. Dr. Cleora Daft notified. This RN and Dr. Cleora Daft went bedside to evaluate patient. On arrival patient was not having a seizure. Patient speaking freely and answering questions appropriately. VS stable. Pt had soiled the linen and her pants. Helped patient change into another pair of her pants and bed linen changed. Bed pad applied to patient bed. Pt given fresh warm blankets. Pt alert/oriented at this time. Will continue to monitor.

## 2023-05-04 DIAGNOSIS — R569 Unspecified convulsions: Secondary | ICD-10-CM

## 2023-05-04 DIAGNOSIS — G40919 Epilepsy, unspecified, intractable, without status epilepticus: Secondary | ICD-10-CM | POA: Diagnosis present

## 2023-05-04 LAB — CBG MONITORING, ED: Glucose-Capillary: 109 mg/dL — ABNORMAL HIGH (ref 70–99)

## 2023-05-04 LAB — LAMOTRIGINE LEVEL: Lamotrigine Lvl: <1 ug/mL — ABNORMAL LOW (ref 2.0–20.0)

## 2023-05-04 LAB — TROPONIN I (HIGH SENSITIVITY): Troponin I (High Sensitivity): 73 ng/L — ABNORMAL HIGH (ref <18)

## 2023-05-04 LAB — SEDIMENTATION RATE: Sed Rate: 9 mm/h (ref 0–20)

## 2023-05-04 LAB — C-REACTIVE PROTEIN: CRP: 0.6 mg/dL (ref <1.0)

## 2023-05-04 MED ORDER — SULFAMETHOXAZOLE-TRIMETHOPRIM 800-160 MG PO TABS: 2.0000 | ORAL_TABLET | Freq: Two times a day (BID) | ORAL | 0 refills | Status: AC

## 2023-05-04 MED ORDER — QUETIAPINE FUMARATE 200 MG PO TABS
400.0000 mg | ORAL_TABLET | Freq: Every day | ORAL | Status: DC
  Administered 2023-05-04: 400 mg via ORAL
  Filled 2023-05-04: qty 2

## 2023-05-04 MED ORDER — KETOROLAC TROMETHAMINE 30 MG/ML IJ SOLN
30.0000 mg | Freq: Four times a day (QID) | INTRAMUSCULAR | Status: DC | PRN
  Filled 2023-05-04: qty 1

## 2023-05-04 MED ORDER — INSULIN GLARGINE-YFGN 100 UNIT/ML ~~LOC~~ SOLN
8.0000 [IU] | Freq: Two times a day (BID) | SUBCUTANEOUS | Status: DC
  Administered 2023-05-04: 8 [IU] via SUBCUTANEOUS
  Filled 2023-05-04 (×2): qty 0.08

## 2023-05-04 MED ORDER — PROPRANOLOL HCL 20 MG PO TABS
80.0000 mg | ORAL_TABLET | Freq: Three times a day (TID) | ORAL | Status: DC
  Administered 2023-05-04: 80 mg via ORAL
  Filled 2023-05-04: qty 4

## 2023-05-04 MED ORDER — ONDANSETRON HCL 4 MG PO TABS: 4.0000 mg | ORAL_TABLET | Freq: Four times a day (QID) | ORAL | Status: DC | PRN

## 2023-05-04 MED ORDER — ACETAMINOPHEN 650 MG RE SUPP: 650.0000 mg | RECTAL | Status: DC | PRN

## 2023-05-04 MED ORDER — ORAL CARE MOUTH RINSE: 15.0000 mL | OROMUCOSAL | Status: DC | PRN

## 2023-05-04 MED ORDER — OXYCODONE HCL ER 10 MG PO T12A
40.0000 mg | EXTENDED_RELEASE_TABLET | Freq: Two times a day (BID) | ORAL | Status: DC
  Administered 2023-05-04: 40 mg via ORAL
  Filled 2023-05-04: qty 4

## 2023-05-04 MED ORDER — ONDANSETRON HCL 4 MG/2ML IJ SOLN: 4.0000 mg | Freq: Four times a day (QID) | INTRAMUSCULAR | Status: DC | PRN

## 2023-05-04 MED ORDER — INSULIN ASPART 100 UNIT/ML IJ SOLN: 0.0000 [IU] | Freq: Every day | INTRAMUSCULAR | Status: DC

## 2023-05-04 MED ORDER — LAMOTRIGINE 100 MG PO TABS: 100.0000 mg | ORAL_TABLET | Freq: Three times a day (TID) | ORAL | Status: DC

## 2023-05-04 MED ORDER — ACETAMINOPHEN 325 MG PO TABS: 650.0000 mg | ORAL_TABLET | ORAL | Status: DC | PRN

## 2023-05-04 MED ORDER — ORAL CARE MOUTH RINSE
15.0000 mL | OROMUCOSAL | Status: DC
  Filled 2023-05-04 (×10): qty 15

## 2023-05-04 MED ORDER — ENOXAPARIN SODIUM 60 MG/0.6ML IJ SOSY
0.5000 mg/kg | PREFILLED_SYRINGE | INTRAMUSCULAR | Status: DC
  Administered 2023-05-04: 42.5 mg via SUBCUTANEOUS
  Filled 2023-05-04: qty 0.6

## 2023-05-04 MED ORDER — SULFAMETHOXAZOLE-TRIMETHOPRIM 800-160 MG PO TABS
2.0000 | ORAL_TABLET | Freq: Two times a day (BID) | ORAL | Status: DC
  Administered 2023-05-04: 2 via ORAL
  Filled 2023-05-04: qty 2

## 2023-05-04 MED ORDER — METHIMAZOLE 10 MG PO TABS
20.0000 mg | ORAL_TABLET | Freq: Two times a day (BID) | ORAL | Status: DC
  Administered 2023-05-04: 20 mg via ORAL
  Filled 2023-05-04: qty 2

## 2023-05-04 MED ORDER — INSULIN ASPART 100 UNIT/ML IJ SOLN: 0.0000 [IU] | Freq: Three times a day (TID) | INTRAMUSCULAR | Status: DC

## 2023-05-04 MED ORDER — QUETIAPINE FUMARATE 25 MG PO TABS
50.0000 mg | ORAL_TABLET | Freq: Two times a day (BID) | ORAL | Status: DC
  Administered 2023-05-04: 50 mg via ORAL
  Filled 2023-05-04: qty 2

## 2023-05-04 NOTE — H&P (Addendum)
 History and Physical    Patient: Nancy [name] ZOX:[id number] DOB: 12-Mar-2000 DOA: 05/03/2023 DOS: the patient was seen and examined on 05/04/2023 PCP: Nancy Hines, FNP  Patient coming from: Home  Chief Complaint:  Chief Complaint  Patient presents with   Seizures   Headache   Abdominal Pain    HPI: Nancy [name] is a 23 y.o. female with medical history significant for 22yo with type 2 diabetes, nonepileptic seizures, and uncontrolled hyperthyroidism recently admitted from 4/24 to 4/26 with seizures, diagnosed with thyroid  storm, initially treated in the ICU, EEG negative for seizures, ultimately signing out AMA following inquiry into showing "factitious evidence of controlled substance prescriptions to get narcotics", who returns to the ED by EMS reporting a seizure, abdominal pain and headache.  While in the ED during one of the witnessed seizures patient was "speaking freely and answering questions appropriately" ED course and data review: BP 142/88, heart rate in the 90s Labs notable for TSH less than 0.01 and T4 elevated at 1.87 WBC 11.5, glucose 163, troponin 76-->73(down from the 100s during recent admission)  EKG, personally viewed and interpreted showing sinus tachycardia with ST elevation suggesting acute pericarditis (echo 05/02/2023 pericardium: There is no evidence of pericardial effusion)  CT head and C-spine nonacute  Patient treated with Toradol   Hospitalist consulted for admission.     Review of Systems: As mentioned in the history of present illness. All other systems reviewed and are negative.  Past Medical History:  Diagnosis Date   Acanthosis nigricans    Anxiety    CHF (congestive heart failure) (HCC)    Chronic lower back pain    Depression    DKA, type 1 (HCC) 09/13/2018   Dyspepsia    Obesity    Ovarian cyst    pt is not aware of this hx (11/24/2017)   Precocious adrenarche (HCC)    Premature baby    Seizures (HCC)    Type II diabetes  mellitus (HCC)    insulin  dependant   Past Surgical History:  Procedure Laterality Date   ABDOMINAL HERNIA REPAIR     "I was a baby"   BIOPSY  10/12/2018   Procedure: BIOPSY;  Surgeon: Lajuan Pila, MD;  Location: [facility] ENDOSCOPY;  Service: Endoscopy;;   BIOPSY  02/28/2020   Procedure: BIOPSY;  Surgeon: Annis Kinder, DO;  Location: [facility] ENDOSCOPY;  Service: Gastroenterology;;   ESOPHAGOGASTRODUODENOSCOPY (EGD) WITH PROPOFOL  N/A 10/12/2018   Procedure: ESOPHAGOGASTRODUODENOSCOPY (EGD) WITH PROPOFOL ;  Surgeon: Lajuan Pila, MD;  Location: [facility] ENDOSCOPY;  Service: Endoscopy;  Laterality: N/A;   ESOPHAGOGASTRODUODENOSCOPY (EGD) WITH PROPOFOL  N/A 02/28/2020   Procedure: ESOPHAGOGASTRODUODENOSCOPY (EGD) WITH PROPOFOL ;  Surgeon: Annis Kinder, DO;  Location: [facility] ENDOSCOPY;  Service: Gastroenterology;  Laterality: N/A;   FLEXIBLE SIGMOIDOSCOPY N/A 02/28/2020   Procedure: FLEXIBLE SIGMOIDOSCOPY;  Surgeon: Annis Kinder, DO;  Location: [facility] ENDOSCOPY;  Service: Gastroenterology;  Laterality: N/A;   HERNIA REPAIR     LEFT HEART CATH AND CORONARY ANGIOGRAPHY N/A 10/13/2018   Procedure: LEFT HEART CATH AND CORONARY ANGIOGRAPHY;  Surgeon: Odie Benne, MD;  Location: [facility] INVASIVE CV LAB;  Service: Cardiovascular;  Laterality: N/A;   TONSILLECTOMY AND ADENOIDECTOMY     WISDOM TOOTH EXTRACTION  2017   Social History:  reports that she has been smoking e-cigarettes. She has never used smokeless tobacco. She reports that she does not drink alcohol and does not use drugs.  Allergies  Allergen Reactions   Oatmeal Anaphylaxis   Tomato Anaphylaxis  Acetaminophen  Other (See Comments)    Avoids due to liver   Ibuprofen  Other (See Comments)    GI MD said to not take this anymore    Family History  Problem Relation Age of Onset   Diabetes Mother    Hypertension Mother    Obesity Mother    Asthma Mother    Allergic rhinitis Mother    Eczema Mother    Cervical cancer Mother    Diabetes  Father    Hypertension Father    Obesity Father    Hyperlipidemia Father    Hypertension Paternal Aunt    Hypertension Maternal Grandfather    Colon cancer Maternal Grandfather    Diabetes Paternal Grandmother    Obesity Paternal Grandmother    Diabetes Paternal Grandfather    Obesity Paternal Grandfather    Angioedema Neg Hx    Immunodeficiency Neg Hx    Urticaria Neg Hx    Stomach cancer Neg Hx    Esophageal cancer Neg Hx     Prior to Admission medications   Medication Sig Start Date End Date Taking? Authorizing Provider  furosemide  (LASIX ) 20 MG tablet Take 50 mg by mouth daily as needed for fluid or edema.    [provider]  Glucagon  (BAQSIMI  TWO PACK) 3 MG/DOSE POWD Place 1 spray into the nose as needed. 08/23/21   Wouk, Haynes Lips, MD  Insulin  Aspart FlexPen (NOVOLOG ) 100 UNIT/ML Inject 15 Units into the skin as directed. Sliding scale insulin  as follows: BG 150-200  = 2 units BG  200-250 = 4 units BG  250-300 = 6 units BG  300-350 = 8 units BG  350-400 = 10 units 01/09/23   [provider]  insulin  glargine (LANTUS ) 100 unit/mL SOPN Inject 20 Units into the skin 2 (two) times daily. Patient taking differently: Inject 8 Units into the skin 2 (two) times daily. 08/23/21   Wouk, Haynes Lips, MD  lidocaine  (LIDODERM ) 5 % Place 1 patch onto the skin every 12 (twelve) hours. Remove & Discard patch within 12 hours or as directed by MD Patient not taking: Reported on 05/02/2023 02/03/23 02/03/24  Twilla Galea, MD  methimazole  (TAPAZOLE ) 10 MG tablet Take 2 tablets (20 mg total) by mouth 2 (two) times daily. 10/05/21   Garrison Kanner, MD  ondansetron  (ZOFRAN ) 4 MG tablet Take 1 tablet (4 mg total) by mouth daily as needed for nausea or vomiting. 10/10/22 10/10/23  Buell Carmin, MD  oxyCODONE  ER (XTAMPZA  ER) 36 MG C12A Take 36 mg by mouth as directed.    [provider]  promethazine  (PHENERGAN ) 25 MG suppository Place 25 mg rectally every 8 (eight) hours as needed  for nausea or vomiting. 05/23/22   [provider]  propranolol  (INDERAL ) 80 MG tablet Take 1 tablet (80 mg total) by mouth 3 (three) times daily. 10/05/21   Garrison Kanner, MD  QUEtiapine  (SEROQUEL ) 100 MG tablet Take 400 mg by mouth at bedtime.    [provider]  QUEtiapine  (SEROQUEL ) 50 MG tablet Take 50 mg by mouth 2 (two) times daily.    [provider]  prochlorperazine  (COMPAZINE ) 25 MG suppository PLACE 1 SUPPOSITORY (25 MG TOTAL) RECTALLY EVERY TWELVE HOURS AS NEEDED FOR NAUSEA OR VOMITING. 02/29/20 02/29/20  Burton Casey, MD    Physical Exam: Vitals:   05/03/23 2200 05/03/23 2230 05/03/23 2300 05/04/23 0000  BP: (!) 159/92 123/83 (!) 116/57 125/64  Pulse: 96 (!) 108 91 83  Resp: (!) 34  (!) 23  Temp:      SpO2: 100% 100% 100% 100%  Weight:      Height:       Physical Exam Vitals and nursing note reviewed.  Constitutional:      General: She is not in acute distress. HENT:     Head: Normocephalic and atraumatic.  Cardiovascular:     Rate and Rhythm: Regular rhythm. Tachycardia present.     Heart sounds: Normal heart sounds.  Pulmonary:     Effort: Pulmonary effort is normal.     Breath sounds: Normal breath sounds.  Abdominal:     Palpations: Abdomen is soft.     Tenderness: There is no abdominal tenderness.  Neurological:     Mental Status: Mental status is at baseline.     Labs on Admission: I have personally reviewed following labs and imaging studies  CBC: Recent Labs  Lab 04/30/23 2014 05/01/23 0620 05/02/23 0430 05/03/23 2147  WBC 11.0* 9.8 7.6 11.5*  NEUTROABS  --   --   --  3.6  HGB 13.5 12.0 11.3* 12.1  HCT 40.5 36.7 33.9* 37.7  MCV 72.5* 73.8* 74.2* 75.2*  PLT 255 219 190 218   Basic Metabolic Panel: Recent Labs  Lab 04/30/23 2014 05/01/23 0620 05/02/23 0430 05/03/23 2147  NA 136 136 131* 138  K 3.0* 4.0 4.6 3.5  CL 102 108 105 105  CO2 20* 21* 21* 24  GLUCOSE 168* 110* 375* 163*  BUN 16 17 24* 19   CREATININE 0.56 0.56 0.74 0.65  CALCIUM  9.2 9.1 9.0 8.9  MG 2.0 2.1 2.2  --   PHOS 3.9 3.1 4.4  --    GFR: Estimated Creatinine Clearance: 112.5 mL/min (by C-G formula based on SCr of 0.65 mg/dL). Liver Function Tests: Recent Labs  Lab 04/30/23 2014 05/01/23 0620 05/02/23 0430 05/03/23 2147  AST 46* 39  --  21  ALT 38 39  --  28  ALKPHOS 83 74  --  65  BILITOT 1.7* 1.3*  --  0.7  PROT 7.2 7.1  --  6.7  ALBUMIN 3.8 3.8 3.7 3.8   No results for input(s): "LIPASE", "AMYLASE" in the last 168 hours. No results for input(s): "AMMONIA" in the last 168 hours. Coagulation Profile: No results for input(s): "INR", "PROTIME" in the last 168 hours. Cardiac Enzymes: No results for input(s): "CKTOTAL", "CKMB", "CKMBINDEX", "TROPONINI" in the last 168 hours. BNP (last 3 results) No results for input(s): "PROBNP" in the last 8760 hours. HbA1C: No results for input(s): "HGBA1C" in the last 72 hours. CBG: Recent Labs  Lab 05/01/23 1219 05/01/23 1636 05/01/23 2114 05/02/23 0814 05/02/23 1200  GLUCAP 190* 186* 318* 262* 122*   Lipid Profile: No results for input(s): "CHOL", "HDL", "LDLCALC", "TRIG", "CHOLHDL", "LDLDIRECT" in the last 72 hours. Thyroid  Function Tests: Recent Labs    05/03/23 2147  TSH <0.010*  FREET4 1.87*   Anemia Panel: No results for input(s): "VITAMINB12", "FOLATE", "FERRITIN", "TIBC", "IRON", "RETICCTPCT" in the last 72 hours. Urine analysis:    Component Value Date/Time   COLORURINE YELLOW (A) 05/01/2023 1535   APPEARANCEUR CLEAR (A) 05/01/2023 1535   LABSPEC 1.029 05/01/2023 1535   PHURINE 5.0 05/01/2023 1535   GLUCOSEU 150 (A) 05/01/2023 1535   HGBUR NEGATIVE 05/01/2023 1535   BILIRUBINUR NEGATIVE 05/01/2023 1535   BILIRUBINUR negative 07/13/2018 1651   KETONESUR 20 (A) 05/01/2023 1535   PROTEINUR NEGATIVE 05/01/2023 1535   UROBILINOGEN 0.2 07/13/2018 1651   UROBILINOGEN 1.0 07/09/2017 1324  NITRITE NEGATIVE 05/01/2023 1535   LEUKOCYTESUR  NEGATIVE 05/01/2023 1535    Radiological Exams on Admission: CT Cervical Spine Wo Contrast Result Date: 05/03/2023 EXAM: CT CERVICAL SPINE WITHOUT CONTRAST 05/03/2023 10:29:40 PM TECHNIQUE: CT of the cervical spine was performed without the administration of intravenous contrast. Multiplanar reformatted images are provided for review. Automated exposure control, iterative reconstruction, and/or weight based adjustment of the mA/kV was utilized to reduce the radiation dose to as low as reasonably achievable. COMPARISON: 04/30/23 CLINICAL HISTORY: Neck trauma, midline tenderness (Age 51-64y). Notified by a registration member that patient was having a seizure. Dr. Cleora Daft notified. This RN and Dr. Cleora Daft went bedside to evaluate patient. On arrival patient was not having a seizure. Patient speaking freely and answering questions appropriately. VS stable. Patient had soiled the linen and her pants. Helped patient change into another pair of her pants and bed linen changed. Bed pad applied to patient bed. Patient given fresh warm blankets. Patient alert and oriented at this time. Will continue to monitor. FINDINGS: BONES/ALIGNMENT: There is no acute fracture or traumatic malalignment. DEGENERATIVE CHANGES: No significant degenerative changes. SOFT TISSUES: There is no prevertebral soft tissue swelling. IMPRESSION: 1. No acute abnormality of the cervical spine. Electronically signed by: Zadie Herter MD 05/03/2023 10:38 PM EDT RP Workstation: [id number]   CT Head Wo Contrast Result Date: 05/03/2023 EXAM: CT HEAD WITHOUT 05/03/2023 10:29:40 PM TECHNIQUE: CT of the head was performed without the administration of intravenous contrast. Automated exposure control, iterative reconstruction, and/or weight based adjustment of the mA/kV was utilized to reduce the radiation dose to as low as reasonably achievable. COMPARISON: 04/30/2023 CLINICAL HISTORY: Head trauma, abnormal mental status (Age 57-64y). Notified by a  registration member that patient was having a seizure. Dr. Cleora Daft notified. This RN and Dr. Cleora Daft went bedside to evaluate patient. On arrival patient was not having a seizure. Patient speaking freely and answering questions appropriately. VS stable. Pt had soiled the linen and her pants. Helped patient change into another pair of her pants and bed linen changed. Bed pad applied to patient bed. Pt given fresh warm blankets. Pt alert/oriented at this time. Will continue to monitor. FINDINGS: BRAIN AND VENTRICLES: There is no acute intracranial hemorrhage, mass effect or midline shift. No abnormal extra-axial fluid collection. The gray-white differentiation is maintained without evidence of an acute infarct. There is no evidence of hydrocephalus. ORBITS: The visualized portion of the orbits demonstrate no acute abnormality. SINUSES: The visualized paranasal sinuses and mastoid air cells demonstrate no acute abnormality. SOFT TISSUES AND SKULL: No acute abnormality of the visualized skull or soft tissues. IMPRESSION: 1. No acute intracranial abnormality. Electronically signed by: Zadie Herter MD 05/03/2023 10:37 PM EDT RP Workstation: [id number]   ECHOCARDIOGRAM COMPLETE Result Date: 05/02/2023    ECHOCARDIOGRAM REPORT   Patient Name:   TYRIONNA AANENSON Date of Exam: 05/02/2023 Medical Rec #:  [id number]        Height:       63.0 in Accession #:    [id number]       Weight:       175.6 lb Date of Birth:  [birth year]        BSA:          1.830 m Patient Age:    22 years         BP:           137/73 mmHg Patient Gender: F  HR:           84 bpm. Exam Location:  Inpatient Procedure: 2D Echo (Both Spectral and Color Flow Doppler were utilized during            procedure). Indications:     elevated troponin  History:         Patient has prior history of Echocardiogram examinations, most                  recent 08/22/2021. Risk Factors:Hypertension and Diabetes.  Sonographer:     Dione Franks RDCS  Referring Phys:  [id number] BRITTON L R[place] Diagnosing Phys: Constancia Delton MD IMPRESSIONS  1. Left ventricular ejection fraction, by estimation, is 60 to 65%. The left ventricle has normal function. The left ventricle has no regional wall motion abnormalities. Left ventricular diastolic parameters were normal.  2. Right ventricular systolic function is normal. The right ventricular size is normal.  3. The mitral valve is normal in structure. No evidence of mitral valve regurgitation.  4. The aortic valve is tricuspid. Aortic valve regurgitation is not visualized.  5. The inferior vena cava is dilated in size with >50% respiratory variability, suggesting right atrial pressure of 8 mmHg. FINDINGS  Left Ventricle: Left ventricular ejection fraction, by estimation, is 60 to 65%. The left ventricle has normal function. The left ventricle has no regional wall motion abnormalities. The left ventricular internal cavity size was normal in size. There is  no left ventricular hypertrophy. Left ventricular diastolic parameters were normal. Right Ventricle: The right ventricular size is normal. No increase in right ventricular wall thickness. Right ventricular systolic function is normal. Left Atrium: Left atrial size was normal in size. Right Atrium: Right atrial size was normal in size. Pericardium: There is no evidence of pericardial effusion. Mitral Valve: The mitral valve is normal in structure. No evidence of mitral valve regurgitation. Tricuspid Valve: The tricuspid valve is normal in structure. Tricuspid valve regurgitation is mild. Aortic Valve: The aortic valve is tricuspid. Aortic valve regurgitation is not visualized. Pulmonic Valve: The pulmonic valve was normal in structure. Pulmonic valve regurgitation is mild. Aorta: The aortic root and ascending aorta are structurally normal, with no evidence of dilitation. Venous: The inferior vena cava is dilated in size with greater than 50% respiratory variability,  suggesting right atrial pressure of 8 mmHg. IAS/Shunts: No atrial level shunt detected by color flow Doppler.  LEFT VENTRICLE PLAX 2D LVIDd:         5.30 cm   Diastology LVIDs:         3.40 cm   LV e' medial:    9.46 cm/s LV PW:         0.90 cm   LV E/e' medial:  12.9 LV IVS:        0.90 cm   LV e' lateral:   14.50 cm/s LVOT diam:     2.10 cm   LV E/e' lateral: 8.4 LV SV:         76 LV SV Index:   42 LVOT Area:     3.46 cm  RIGHT VENTRICLE             IVC RV Basal diam:  2.90 cm     IVC diam: 2.60 cm RV S prime:     15.70 cm/s TAPSE (M-mode): 2.6 cm LEFT ATRIUM             Index        RIGHT ATRIUM  Index LA diam:        4.40 cm 2.40 cm/m   RA Area:     15.30 cm LA Vol (A2C):   63.6 ml 34.76 ml/m  RA Volume:   35.60 ml  19.46 ml/m LA Vol (A4C):   60.4 ml 33.01 ml/m LA Biplane Vol: 66.5 ml 36.35 ml/m  AORTIC VALVE             PULMONIC VALVE LVOT Vmax:   124.00 cm/s PR End Diast Vel: 8.29 msec LVOT Vmean:  82.600 cm/s LVOT VTI:    0.220 m  AORTA Ao Root diam: 2.90 cm Ao Asc diam:  3.20 cm MITRAL VALVE MV Area (PHT): 4.36 cm     SHUNTS MV Decel Time: 174 msec     Systemic VTI:  0.22 m MV E velocity: 122.00 cm/s  Systemic Diam: 2.10 cm MV A velocity: 56.50 cm/s MV E/A ratio:  2.16 Constancia Delton MD Electronically signed by Constancia Delton MD Signature Date/Time: 05/02/2023/2:26:47 PM    Final    Data Reviewed for HPI: Relevant notes from primary care and specialist visits, past discharge summaries as available in EHR, including Care Everywhere. Prior diagnostic testing as pertinent to current admission diagnoses Updated medications and problem lists for reconciliation ED course, including vitals, labs, imaging, treatment and response to treatment Triage notes, nursing and pharmacy notes and ED provider's notes Notable results as noted above in HPI      Assessment and Plan: * Seizure-like activity (HCC) History of psychogenic nonepileptic seizures Had recent negative EEG Continue  antiepileptic with Ativan  if seizure Seizure precautions Neurology consulted-patient states she is on Lamictal  (pending verification)  Nonspecific abnormal electrocardiogram (ECG) (EKG) Questionable pericarditis though appears unchanged from prior Echocardiogram from 4/26 showed no effusion Received Toradol  in the ED Will treat empirically with toradol  prn Nonnarcotic pain control Get sed rate and CRP Will defer to cardiology for diagnosis and decide on treatment  Hyperthyroidism TSH suppressed to less than 0.01 likely due to noncompliance Resume home methimazole  and propranolol   Insulin  dependent type 2 diabetes mellitus (HCC) Continue basal insulin  Sliding scale coverage  Chronic pain --Patient reports being on Xtampza  for chronic pain --PDMP from 4/28 consistent with no actively prescribed narcotics --Pain control with acetaminophen  with Toradol  as needed for severe or breakthrough pain  OF note: At last admission patient showed 2 screenshots of images that were scanned into Media, showing rx by Angelique Barer (who is an NP in [place]) last picked up on 2/6 and 2/26 (27 mg, no dosing instructions); another last picked up on 4/1 for Xtampza  36 mg (same prescriber, no dosing instructions); and a prescription for Oxy IR 30 mg tabs from 4/1 (per med rec tech- rx probable belonged to someone else)     DVT prophylaxis: Lovenox   Consults: Neurology  Advance Care Planning:   Code Status: Prior   Family Communication: none  Disposition Plan: Back to previous home environment  Severity of Illness: The appropriate patient status for this patient is OBSERVATION. Observation status is judged to be reasonable and necessary in order to provide the required intensity of service to ensure the patient's safety. The patient's presenting symptoms, physical exam findings, and initial radiographic and laboratory data in the context of their medical condition is felt to place them at  decreased risk for further clinical deterioration. Furthermore, it is anticipated that the patient will be medically stable for discharge from the hospital within 2 midnights of admission.   Author: Lanetta Pion,  MD 05/04/2023 1:15 AM  For on call review [email or web address].

## 2023-05-04 NOTE — Assessment & Plan Note (Addendum)
--  Patient reports being on Xtampza  for chronic pain --PDMP from 4/28 consistent with no actively prescribed narcotics --Pain control with acetaminophen  with Toradol  as needed for severe or breakthrough pain  OF note: At last admission patient showed 2 screenshots of images that were scanned into Media, showing rx by Angelique Barer (who is an NP in [place]) last picked up on 2/6 and 2/26 (27 mg, no dosing instructions); another last picked up on 4/1 for Xtampza  36 mg (same prescriber, no dosing instructions); and a prescription for Oxy IR 30 mg tabs from 4/1 (per med rec tech- rx probable belonged to someone else)

## 2023-05-04 NOTE — Consult Note (Signed)
 NEUROLOGY CONSULT NOTE   Date of service: May 04, 2023 Patient Name: Nancy [name] MRN:  [id number] DOB:  Jun 16, 2000 Chief Complaint: "seizure[-like activity]" Requesting Provider: Janeane Mealy, MD  History of Present Illness  Nancy [name] is a 23 y.o. female with a past medical history significant for anxiety, congestive heart failure, depression, obesity (BMI 30), poorly controlled diabetes with history of DKA, intractable nausea/vomiting with suspected gastroparesis, polysubstance abuse, documented pseudoseizures no clear evidence of any underlying seizure activity, recurrent admissions for acute on chronic hyperthyroidism due to non-adherence.   She is being admitted for seizure-like activity; per nursing notes during witnessed seizure-like activity in the ED patient was "speaking freely and answering questions appropriately"   On our last interview January 26, 2022 patient reported she does is not aware of her typical episodes therefore can only give limited history about them but she reports that she usually has generalized shaking lasting 10 to 15 minutes.  Today she reports she typically gets a prodrome of feeling dizzy or lightheaded and has fallen and hit her head on the table several times so she tries to lay down to avoid injury   Of note patient has had inpatient EEG evaluations on 08/08/2019, 01/29/2019, 01/28/2019, 12/01/2018, 08/07/2018, and 06/15/2018 without evidence of seizures or epileptiform activity identified -- evaluation has included long-term EEG monitoring with multiple spells captured. She recently had a routine EEG as well on 05/01/2023 that was negative  Regarding medication adherence   Of note patient provides a variable history to different members of the medical care team.  Her reports of medication adherence a do not match fill history or other available records.  She frequently reports she is taking older left over pills medication, but cannot explain well  how she would have significant supplies of extra medication given her spotty fill history  Today I asked pharmacy to help verify her lamotrigine  fill history.  To them she reported she uses a home delivery med service, however the number provided just goes to voicemail and reports subscriber is unavailable. To me she reported her primary care provider manages her lamotrigine .  However PCP office notes only [facility] [place] [facility] [employer] listed and does not have a record of who manages her lamotrigine  which is noted as simply a historical medication in their records    ROS  Please see pertinent positives and negatives in HPI above  Past History   Past Medical History:  Diagnosis Date   Acanthosis nigricans    Anxiety    CHF (congestive heart failure) (HCC)    Chronic lower back pain    Depression    DKA, type 1 (HCC) 09/13/2018   Dyspepsia    Obesity    Ovarian cyst    pt is not aware of this hx (11/24/2017)   Precocious adrenarche (HCC)    Premature baby    Seizures (HCC)    Type II diabetes mellitus (HCC)    insulin  dependant    Past Surgical History:  Procedure Laterality Date   ABDOMINAL HERNIA REPAIR     "I was a baby"   BIOPSY  10/12/2018   Procedure: BIOPSY;  Surgeon: Lajuan Pila, MD;  Location: [facility] ENDOSCOPY;  Service: Endoscopy;;   BIOPSY  02/28/2020   Procedure: BIOPSY;  Surgeon: Annis Kinder, DO;  Location: [facility] ENDOSCOPY;  Service: Gastroenterology;;   ESOPHAGOGASTRODUODENOSCOPY (EGD) WITH PROPOFOL  N/A 10/12/2018   Procedure: ESOPHAGOGASTRODUODENOSCOPY (EGD) WITH PROPOFOL ;  Surgeon: Lajuan Pila, MD;  Location:  [facility] ENDOSCOPY;  Service: Endoscopy;  Laterality: N/A;   ESOPHAGOGASTRODUODENOSCOPY (EGD) WITH PROPOFOL  N/A 02/28/2020   Procedure: ESOPHAGOGASTRODUODENOSCOPY (EGD) WITH PROPOFOL ;  Surgeon: Annis Kinder, DO;  Location: [facility] ENDOSCOPY;  Service: Gastroenterology;  Laterality: N/A;   FLEXIBLE SIGMOIDOSCOPY N/A 02/28/2020   Procedure: FLEXIBLE  SIGMOIDOSCOPY;  Surgeon: Annis Kinder, DO;  Location: [facility] ENDOSCOPY;  Service: Gastroenterology;  Laterality: N/A;   HERNIA REPAIR     LEFT HEART CATH AND CORONARY ANGIOGRAPHY N/A 10/13/2018   Procedure: LEFT HEART CATH AND CORONARY ANGIOGRAPHY;  Surgeon: Odie Benne, MD;  Location: [facility] INVASIVE CV LAB;  Service: Cardiovascular;  Laterality: N/A;   TONSILLECTOMY AND ADENOIDECTOMY     WISDOM TOOTH EXTRACTION  2017    Family History: Family History  Problem Relation Age of Onset   Diabetes Mother    Hypertension Mother    Obesity Mother    Asthma Mother    Allergic rhinitis Mother    Eczema Mother    Cervical cancer Mother    Diabetes Father    Hypertension Father    Obesity Father    Hyperlipidemia Father    Hypertension Paternal Aunt    Hypertension Maternal Grandfather    Colon cancer Maternal Grandfather    Diabetes Paternal Grandmother    Obesity Paternal Grandmother    Diabetes Paternal Grandfather    Obesity Paternal Grandfather    Angioedema Neg Hx    Immunodeficiency Neg Hx    Urticaria Neg Hx    Stomach cancer Neg Hx    Esophageal cancer Neg Hx     Social History  reports that she has been smoking e-cigarettes. She has never used smokeless tobacco. She reports that she does not drink alcohol and does not use drugs.  Allergies  Allergen Reactions   Oatmeal Anaphylaxis   Tomato Anaphylaxis   Acetaminophen  Other (See Comments)    Avoids due to liver   Ibuprofen  Other (See Comments)    GI MD said to not take this anymore    Medications   Current Facility-Administered Medications:    acetaminophen  (TYLENOL ) tablet 650 mg, 650 mg, Oral, Q4H PRN **OR** acetaminophen  (TYLENOL ) suppository 650 mg, 650 mg, Rectal, Q4H PRN, Lanetta Pion, MD   enoxaparin  (LOVENOX ) injection 42.5 mg, 0.5 mg/kg, Subcutaneous, Q24H, Lanetta Pion, MD   insulin  glargine-yfgn (SEMGLEE ) injection 8 Units, 8 Units, Subcutaneous, BID, Lanetta Pion, MD   ketorolac   (TORADOL ) 30 MG/ML injection 30 mg, 30 mg, Intravenous, Q6H PRN, Lanetta Pion, MD   lamoTRIgine  (LAMICTAL ) tablet 100 mg, 100 mg, Oral, TID, Lanetta Pion, MD   methimazole  (TAPAZOLE ) tablet 20 mg, 20 mg, Oral, BID, Lanetta Pion, MD   ondansetron  (ZOFRAN ) tablet 4 mg, 4 mg, Oral, Q6H PRN **OR** ondansetron  (ZOFRAN ) injection 4 mg, 4 mg, Intravenous, Q6H PRN, Lanetta Pion, MD   Oral care mouth rinse, 15 mL, Mouth Rinse, Q2H, Lanetta Pion, MD   Oral care mouth rinse, 15 mL, Mouth Rinse, PRN, Lanetta Pion, MD   propranolol  (INDERAL ) tablet 80 mg, 80 mg, Oral, TID, Lanetta Pion, MD   QUEtiapine  (SEROQUEL ) tablet 400 mg, 400 mg, Oral, QHS, Brion Cancel V, MD, 400 mg at 05/04/23 0254   QUEtiapine  (SEROQUEL ) tablet 50 mg, 50 mg, Oral, BID, Lanetta Pion, MD  Current Outpatient Medications:    furosemide  (LASIX ) 20 MG tablet, Take 50 mg by mouth daily as needed for fluid or edema., Disp: , Rfl:    Glucagon  (BAQSIMI   TWO PACK) 3 MG/DOSE POWD, Place 1 spray into the nose as needed., Disp: 1 each, Rfl: 1   Insulin  Aspart FlexPen (NOVOLOG ) 100 UNIT/ML, Inject 15 Units into the skin as directed. Sliding scale insulin  as follows: BG 150-200  = 2 units BG  200-250 = 4 units BG  250-300 = 6 units BG  300-350 = 8 units BG  350-400 = 10 units, Disp: , Rfl:    methimazole  (TAPAZOLE ) 10 MG tablet, Take 2 tablets (20 mg total) by mouth 2 (two) times daily., Disp: 120 tablet, Rfl: 2   ondansetron  (ZOFRAN ) 4 MG tablet, Take 1 tablet (4 mg total) by mouth daily as needed for nausea or vomiting., Disp: 30 tablet, Rfl: 1   promethazine  (PHENERGAN ) 25 MG suppository, Place 25 mg rectally every 8 (eight) hours as needed for nausea or vomiting., Disp: , Rfl:    propranolol  (INDERAL ) 80 MG tablet, Take 1 tablet (80 mg total) by mouth 3 (three) times daily., Disp: 90 tablet, Rfl: 2   QUEtiapine  (SEROQUEL ) 100 MG tablet, Take 400 mg by mouth at bedtime., Disp: , Rfl:    QUEtiapine  (SEROQUEL ) 50 MG tablet,  Take 50 mg by mouth 2 (two) times daily., Disp: , Rfl:    insulin  glargine (LANTUS ) 100 unit/mL SOPN, Inject 20 Units into the skin 2 (two) times daily. (Patient taking differently: Inject 8 Units into the skin 2 (two) times daily.), Disp: 15 mL, Rfl: 0   oxyCODONE  ER (XTAMPZA  ER) 36 MG C12A, Take 36 mg by mouth as directed., Disp: , Rfl:   Vitals   Vitals:   05/04/23 0458 05/04/23 0530 05/04/23 0600 05/04/23 0630  BP: 131/69 126/60 120/63 (!) 149/94  Pulse: (!) 115 98 86 93  Resp: 19 (!) 21 17 (!) 25  Temp:  98.5 F (36.9 C)    TempSrc:  Oral    SpO2: 100% 100% 100% 100%  Weight:      Height:        Body mass index is 33.48 kg/m.  Physical Exam   Constitutional: Appears well-developed and well-nourished.  Psych: Affect flat, poor eye contact Eyes: No scleral injection.  HENT: No OP obstruction.  Head: Normocephalic.  Cardiovascular: Normal rate and regular rhythm.  Respiratory: Effort normal, non-labored breathing.  GI: Soft.  No distension.  Skin: No obvious lesions  Neurologic Examination   Mental Status: Patient is awake, alert, oriented to person, place, year, day of the week and situation, including details of recent hospitalization.  But then reports she cannot remember the month Patient is able to give a clear and coherent history. She operates her phone to pull up her fill history No signs of aphasia or neglect Cranial Nerves: II: Pupils are equal, round, and reactive to light.  Poorly tolerates funduscopic examination with frequent eye blinking and poor fixation limiting my ability to perform a bedside examination III,IV, VI: On formal testing initially does not look towards the left or follow my finger well, but when not being formally tested eyes move appropriately in all directions VII: Facial movement is symmetric.  VIII: hearing is intact to voice XI: Shoulder shrug is symmetric. XII: tongue is midline without atrophy or fasciculations.  Motor: Mild  pronation without drift of the bilateral upper extremities.  Some giveaway weakness throughout.  Defers testing of the lower extremities due to reported left knee pain.  However no complaints of pain on testing of ambulation as documented below Sensory: Reports only feeling examiner on the right leg in  a nonphysiological manner, but based on observations sensation intact throughout Deep Tendon Reflexes: 2+ and symmetric in the brachioradialis and patellae (left patellar testing slightly limited by exaggerated movement secondary to reported pain) Cerebellar: Finger-to-nose intact bilaterally Gait:  Able to rise on heels and toes.  No truncal ataxia.  Normal ability to turn.  Astasia-abasia on tandem gait testing    Labs/Imaging/Neurodiagnostic studies   CBC:  Recent Labs  Lab 01-Jun-2023 0430 05/03/23 2147  WBC 7.6 11.5*  NEUTROABS  --  3.6  HGB 11.3* 12.1  HCT 33.9* 37.7  MCV 74.2* 75.2*  PLT 190 218   Basic Metabolic Panel:  Lab Results  Component Value Date   NA 138 05/03/2023   K 3.5 05/03/2023   CO2 24 05/03/2023   GLUCOSE 163 (H) 05/03/2023   BUN 19 05/03/2023   CREATININE 0.65 05/03/2023   CALCIUM  8.9 05/03/2023   GFRNONAA >60 05/03/2023   GFRAA >60 10/03/2019   Lipid Panel:  Lab Results  Component Value Date   LDLCALC 26 11/07/2018   HgbA1c:  Lab Results  Component Value Date   HGBA1C 5.8 (H) 03/06/2023   Urine Drug Screen:     Component Value Date/Time   LABOPIA POSITIVE (A) 05/01/2023 0041   LABOPIA POSITIVE (A) 07/22/2020 1207   COCAINSCRNUR NONE DETECTED 05/01/2023 0041   LABBENZ NONE DETECTED 05/01/2023 0041   LABBENZ NONE DETECTED 07/22/2020 1207   AMPHETMU NONE DETECTED 05/01/2023 0041   AMPHETMU NONE DETECTED 07/22/2020 1207   THCU POSITIVE (A) 05/01/2023 0041   THCU POSITIVE (A) 07/22/2020 1207   LABBARB NONE DETECTED 05/01/2023 0041   LABBARB NONE DETECTED 07/22/2020 1207    Alcohol Level     Component Value Date/Time   ETH <10  01/25/2022 1708   INR  Lab Results  Component Value Date   INR 1.1 04/10/2020   APTT  Lab Results  Component Value Date   APTT 29 04/10/2020   AED levels:  Lab Results  Component Value Date   LAMOTRIGINE  <1.0 (L) 07/27/2022   LEVETIRACETA <2.0 (L) 01/25/2022    CT Head without contrast(Personally reviewed): 1. No acute intracranial abnormality.    ASSESSMENT   LABELLA GOLOB is a 23 y.o. female with past medical history as detailed above presenting with nonepileptic events.  Per description there may be some concern for syncope/presyncope and certainly she has uncontrolled medical issues that could be contributing  RECOMMENDATIONS   Initial recommendations pending full evaluation: - Discontinued lamotrigine  pending verification of current use (high risk of Steven's Johnson's syndrome if not gradually uptitrated)  -Supportive care for her episodes  -Benzos only if lasting >10 min with significant vital sign instability (do not treat transient hypoxia from breath holding for example, or transient tachycardia/hypertension)  -Saline stimulus to the eyes can be used to assess for ability to respond to stimuli and help distinguish nonepileptic seizures from epileptic seizure activity -Medical management per primary team   Final recommendations: Extensively reviewed fact sheet on Dissociative Seizures with patient and provided her a copy for her personal review:   https://www.neurosymptoms.org/wp-content/uploads/2020/12/Factsheet.pdf Information leaflet for patients with Dissociative Seizures designed for use by Neurologists  Prepared by [employer]16th July 2014v2.0 FACTSHEET (NEUROLOGY)  Specifically reviewed management of these events: o take simple steps to prevent you from injuring yourself (e.g. look for any hazards in the immediate environment) o take simple steps to save you from embarrassment if the seizure happens in a public place, (e.g. if you have  a warning  and there's enough time, move you to a quiet more private place; adjust your clothing if necessary). It's best if they don't talk to you during a seizure. This is because if they are feeling  frightened by what is happening you may pick up on this and this can make your  seizure worse. Although you may not remember being in the seizure it's likely that  some part of your brain can take in what's going on. Other people should not try to restrain you during an attack as this could make it  worse.  Additionally as there is concern that some of her symptoms described would correlate with presyncope/syncope rather than seizures, recommend continued care of her medical conditions which certainly places her at increased risk for medical issues, for example arrhythmias  Regarding her reports of blurry vision in her right eye, given no objective findings on my ophthalmologic evaluation today, albeit limited due to nonorganic findings as detailed above, but with risk of eye disease secondary to her untreated hyperthyroidism, I do recommend referral to ophthalmology for full evaluation.  Patient does agree that at this time her symptoms are nondisabling  We discussed no driving precautions until her spells are controlled  Very extensive discussion on the importance of medication adherence and that there is risk Landon Pinion Johnson's syndrome (a very dangerous skin rash) if lamotrigine  is restarted too quickly after a long period of nonadherence  Neurology will sign off at this time.  Please reach out if any other questions or concerns arise ______________________________________________________________________  Greater than 80 minutes spent in care of the patient, majority at bedside Discussed with primary team via secure chat  Baldwin Levee MD-PhD [facility] [phone]

## 2023-05-04 NOTE — ED Notes (Signed)
 Pharmacy technician completed medication rec. Call bell alarming after tech left room. Answered call bell and pt continues to request narcotic pain medication.

## 2023-05-04 NOTE — Discharge Summary (Signed)
 Nancy [name] ZOX:[id number] DOB: 03/01/2000 DOA: 05/03/2023  PCP: Zoila Hines, FNP  Admit date: 05/03/2023 Discharge date: 05/04/2023  Time spent: 35 minutes  Recommendations for Outpatient Follow-up:  Pcp f/u  Endocrinology f/u Ophthalmology f/u    Discharge Diagnoses:  Principal Problem:   Seizure-like activity (HCC) Active Problems:   Nonspecific abnormal electrocardiogram (ECG) (EKG)   History of psychogenic nonepileptic seizure   Hyperthyroidism   T2DM (type 2 diabetes mellitus) (HCC)   Obesity   Chronic pain   Polysubstance abuse (HCC)   Breakthrough seizure (HCC)   Discharge Condition: stable  Diet recommendation: carb modified  Filed Weights   05/03/23 2143  Weight: 85.7 kg    History of present illness:  From admission h and p Nancy [name] is a 23 y.o. female with medical history significant for 22yo with type 2 diabetes, nonepileptic seizures, and uncontrolled hyperthyroidism recently admitted from 4/24 to 4/26 with seizures, diagnosed with thyroid  storm, initially treated in the ICU, EEG negative for seizures, ultimately signing out AMA following inquiry into showing "factitious evidence of controlled substance prescriptions to get narcotics", who returns to the ED by EMS reporting a seizure, abdominal pain and headache.  While in the ED during one of the witnessed seizures patient was "speaking freely and answering questions appropriately"   Hospital Course:  Patient presents with concern for repeat seizure. 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 Patient did relate to neurology a report of blurry vision in the right eye - referral to optho was advised and that order has been placed. Recent hospitalization for thyroid  storm but no signs of that here - advised compliance w/ home methimazole  and close f/u with endocrinology. At recent  hospitalization patient did have aspiration of left breast abscess, growing staph aureus, sensitivities pending. Is not currently on antibiotics, we thus started bactrim . Patient's abdominal pain is resolved and so is her headache, with normal imaging of the brain. Of note patient requested opioids and stated she is prescribed them at home, but after extensive searching by our pharmacy team, no recent opioid or other narcotic prescriptions were identified. Stable for discharge and patient is in agreement.   Procedures: none   Consultations: neurology  Discharge Exam: Vitals:   05/04/23 0859 05/04/23 1111  BP: (!) 153/97 116/61  Pulse:  86  Resp: 17 17  Temp: 98.4 F (36.9 C) 98.3 F (36.8 C)  SpO2: 100% 100%    General: NAD Cardiovascular: RRR Respiratory: CTAB Chest: induration superior to nipple and medial to it left breast, no fluctuance (chaperone utilized)  Discharge Instructions   Discharge Instructions     Ambulatory referral to Ophthalmology   Complete by: As directed    Right eye vision changes   Diet Carb Modified   Complete by: As directed    Increase activity slowly   Complete by: As directed       Allergies as of 05/04/2023       Reactions   Oatmeal Anaphylaxis   Tomato Anaphylaxis   Acetaminophen  Other (See Comments)   Avoids due to liver   Ibuprofen  Other (See Comments)   GI MD said to not take this anymore        Medication List     TAKE these medications    Baqsimi  Two Pack 3 MG/DOSE Powd Generic drug: Glucagon  Place 1 spray into the nose as needed.   furosemide  20 MG  tablet Commonly known as: LASIX  Take 50 mg by mouth daily as needed for fluid or edema.   Insulin  Aspart FlexPen 100 UNIT/ML Commonly known as: NOVOLOG  Inject 15 Units into the skin as directed. Sliding scale insulin  as follows: BG 150-200  = 2 units BG  200-250 = 4 units BG  250-300 = 6 units BG  300-350 = 8 units BG  350-400 = 10 units   insulin  glargine 100  unit/mL Sopn Commonly known as: LANTUS  Inject 20 Units into the skin 2 (two) times daily.   methimazole  10 MG tablet Commonly known as: TAPAZOLE  Take 2 tablets (20 mg total) by mouth 2 (two) times daily.   ondansetron  4 MG tablet Commonly known as: Zofran  Take 1 tablet (4 mg total) by mouth daily as needed for nausea or vomiting.   promethazine  25 MG suppository Commonly known as: PHENERGAN  Place 25 mg rectally every 8 (eight) hours as needed for nausea or vomiting.   propranolol  80 MG tablet Commonly known as: INDERAL  Take 1 tablet (80 mg total) by mouth 3 (three) times daily.   QUEtiapine  100 MG tablet Commonly known as: SEROQUEL  Take 400 mg by mouth at bedtime.   QUEtiapine  50 MG tablet Commonly known as: SEROQUEL  Take 50 mg by mouth 2 (two) times daily.   sulfamethoxazole -trimethoprim  800-160 MG tablet Commonly known as: BACTRIM  DS Take 2 tablets by mouth every 12 (twelve) hours for 10 days.       Allergies  Allergen Reactions   Oatmeal Anaphylaxis   Tomato Anaphylaxis   Acetaminophen  Other (See Comments)    Avoids due to liver   Ibuprofen  Other (See Comments)    GI MD said to not take this anymore    Follow-up Information     Ramji, Amrita, FNP Follow up.   Specialty: Family Medicine Contact information: [address] [facility] [address] [place] [address] [phone]                  The results of significant diagnostics from this hospitalization (including imaging, microbiology, ancillary and laboratory) are listed below for reference.    Significant Diagnostic Studies: CT Cervical Spine Wo Contrast Result Date: 05/03/2023 EXAM: CT CERVICAL SPINE WITHOUT CONTRAST 05/03/2023 10:29:40 PM TECHNIQUE: CT of the cervical spine was performed without the administration of intravenous contrast. Multiplanar reformatted images are provided for review. Automated exposure control, iterative reconstruction, and/or weight based adjustment of the mA/kV  was utilized to reduce the radiation dose to as low as reasonably achievable. COMPARISON: 04/30/23 CLINICAL HISTORY: Neck trauma, midline tenderness (Age 28-64y). Notified by a registration member that patient was having a seizure. Dr. Cleora Daft notified. This RN and Dr. Cleora Daft went bedside to evaluate patient. On arrival patient was not having a seizure. Patient speaking freely and answering questions appropriately. VS stable. Patient had soiled the linen and her pants. Helped patient change into another pair of her pants and bed linen changed. Bed pad applied to patient bed. Patient given fresh warm blankets. Patient alert and oriented at this time. Will continue to monitor. FINDINGS: BONES/ALIGNMENT: There is no acute fracture or traumatic malalignment. DEGENERATIVE CHANGES: No significant degenerative changes. SOFT TISSUES: There is no prevertebral soft tissue swelling. IMPRESSION: 1. No acute abnormality of the cervical spine. Electronically signed by: Zadie Herter MD 05/03/2023 10:38 PM EDT RP Workstation: [id number]   CT Head Wo Contrast Result Date: 05/03/2023 EXAM: CT HEAD WITHOUT 05/03/2023 10:29:40 PM TECHNIQUE: CT of the head was performed without the administration of intravenous contrast.  Automated exposure control, iterative reconstruction, and/or weight based adjustment of the mA/kV was utilized to reduce the radiation dose to as low as reasonably achievable. COMPARISON: 04/30/2023 CLINICAL HISTORY: Head trauma, abnormal mental status (Age 63-64y). Notified by a registration member that patient was having a seizure. Dr. Cleora Daft notified. This RN and Dr. Cleora Daft went bedside to evaluate patient. On arrival patient was not having a seizure. Patient speaking freely and answering questions appropriately. VS stable. Pt had soiled the linen and her pants. Helped patient change into another pair of her pants and bed linen changed. Bed pad applied to patient bed. Pt given fresh warm blankets. Pt  alert/oriented at this time. Will continue to monitor. FINDINGS: BRAIN AND VENTRICLES: There is no acute intracranial hemorrhage, mass effect or midline shift. No abnormal extra-axial fluid collection. The gray-white differentiation is maintained without evidence of an acute infarct. There is no evidence of hydrocephalus. ORBITS: The visualized portion of the orbits demonstrate no acute abnormality. SINUSES: The visualized paranasal sinuses and mastoid air cells demonstrate no acute abnormality. SOFT TISSUES AND SKULL: No acute abnormality of the visualized skull or soft tissues. IMPRESSION: 1. No acute intracranial abnormality. Electronically signed by: Zadie Herter MD 05/03/2023 10:37 PM EDT RP Workstation: [id number]   ECHOCARDIOGRAM COMPLETE Result Date: 05/02/2023    ECHOCARDIOGRAM REPORT   Patient Name:   Nancy [name] Date of Exam: 05/02/2023 Medical Rec #:  [id number]        Height:       63.0 in Accession #:    [id number]       Weight:       175.6 lb Date of Birth:  Jul 02, 2000        BSA:          1.830 m Patient Age:    22 years         BP:           137/73 mmHg Patient Gender: F                HR:           84 bpm. Exam Location:  Inpatient Procedure: 2D Echo (Both Spectral and Color Flow Doppler were utilized during            procedure). Indications:     elevated troponin  History:         Patient has prior history of Echocardiogram examinations, most                  recent 08/22/2021. Risk Factors:Hypertension and Diabetes.  Sonographer:     Dione Franks RDCS Referring Phys:  [id number] BRITTON L R[place] Diagnosing Phys: Constancia Delton MD IMPRESSIONS  1. Left ventricular ejection fraction, by estimation, is 60 to 65%. The left ventricle has normal function. The left ventricle has no regional wall motion abnormalities. Left ventricular diastolic parameters were normal.  2. Right ventricular systolic function is normal. The right ventricular size is normal.  3. The mitral valve is  normal in structure. No evidence of mitral valve regurgitation.  4. The aortic valve is tricuspid. Aortic valve regurgitation is not visualized.  5. The inferior vena cava is dilated in size with >50% respiratory variability, suggesting right atrial pressure of 8 mmHg. FINDINGS  Left Ventricle: Left ventricular ejection fraction, by estimation, is 60 to 65%. The left ventricle has normal function. The left ventricle has no regional wall motion abnormalities. The left ventricular internal cavity size was normal in size. There is  no left ventricular hypertrophy. Left ventricular diastolic parameters were normal. Right Ventricle: The right ventricular size is normal. No increase in right ventricular wall thickness. Right ventricular systolic function is normal. Left Atrium: Left atrial size was normal in size. Right Atrium: Right atrial size was normal in size. Pericardium: There is no evidence of pericardial effusion. Mitral Valve: The mitral valve is normal in structure. No evidence of mitral valve regurgitation. Tricuspid Valve: The tricuspid valve is normal in structure. Tricuspid valve regurgitation is mild. Aortic Valve: The aortic valve is tricuspid. Aortic valve regurgitation is not visualized. Pulmonic Valve: The pulmonic valve was normal in structure. Pulmonic valve regurgitation is mild. Aorta: The aortic root and ascending aorta are structurally normal, with no evidence of dilitation. Venous: The inferior vena cava is dilated in size with greater than 50% respiratory variability, suggesting right atrial pressure of 8 mmHg. IAS/Shunts: No atrial level shunt detected by color flow Doppler.  LEFT VENTRICLE PLAX 2D LVIDd:         5.30 cm   Diastology LVIDs:         3.40 cm   LV e' medial:    9.46 cm/s LV PW:         0.90 cm   LV E/e' medial:  12.9 LV IVS:        0.90 cm   LV e' lateral:   14.50 cm/s LVOT diam:     2.10 cm   LV E/e' lateral: 8.4 LV SV:         76 LV SV Index:   42 LVOT Area:     3.46 cm  RIGHT  VENTRICLE             IVC RV Basal diam:  2.90 cm     IVC diam: 2.60 cm RV S prime:     15.70 cm/s TAPSE (M-mode): 2.6 cm LEFT ATRIUM             Index        RIGHT ATRIUM           Index LA diam:        4.40 cm 2.40 cm/m   RA Area:     15.30 cm LA Vol (A2C):   63.6 ml 34.76 ml/m  RA Volume:   35.60 ml  19.46 ml/m LA Vol (A4C):   60.4 ml 33.01 ml/m LA Biplane Vol: 66.5 ml 36.35 ml/m  AORTIC VALVE             PULMONIC VALVE LVOT Vmax:   124.00 cm/s PR End Diast Vel: 8.29 msec LVOT Vmean:  82.600 cm/s LVOT VTI:    0.220 m  AORTA Ao Root diam: 2.90 cm Ao Asc diam:  3.20 cm MITRAL VALVE MV Area (PHT): 4.36 cm     SHUNTS MV Decel Time: 174 msec     Systemic VTI:  0.22 m MV E velocity: 122.00 cm/s  Systemic Diam: 2.10 cm MV A velocity: 56.50 cm/s MV E/A ratio:  2.16 Constancia Delton MD Electronically signed by Constancia Delton MD Signature Date/Time: 05/02/2023/2:26:47 PM    Final    US  FINE NEEDLE ASP 1ST LESION Result Date: 05/01/2023 INDICATION: 23 year old female presents for aspiration breast abscess EXAM: ULTRASOUND-GUIDED ASPIRATION BREAST ABSCESS MEDICATIONS: None ANESTHESIA/SEDATION: Moderate (conscious) sedation was not employed during this procedure. COMPLICATIONS: None PROCEDURE: Informed written consent was obtained from the patient after a thorough discussion of the procedural risks, benefits and alternatives. All questions were addressed. Maximal Sterile Barrier Technique was  utilized including caps, mask, sterile gowns, sterile gloves, sterile drape, hand hygiene and skin antiseptic. A timeout was performed prior to the initiation of the procedure. Ultrasound survey was performed with images stored and sent to PACs. The left breast was prepped with chlorhexidine  in a sterile fashion, and a sterile drape was applied covering the operative field. A sterile gown and sterile gloves were used for the procedure. Local anesthesia was provided with 1% Lidocaine . Ultrasound guidance was used to  infiltrate the region with 1% lidocaine  for local anesthesia. Once local anesthesia was applied, 18 gauge needle was advanced under ultrasound guidance into the abscess. Aspiration proximally 3-4 cc of frankly purulent material was performed sample sent for culture. The density precluded complete evacuation with the 18 gauge needle. Needle was removed and a final image was stored. Sterile bandage placed. Patient tolerated the procedure well and remained hemodynamically stable throughout. No complications were encountered and no significant blood loss was encounter IMPRESSION: Status post ultrasound-guided aspiration of left breast abscess. Signed, Marciano Settles. Rexine Cater, RPVI [facility] Electronically Signed   By: Myrlene Asper D.O.   On: 05/01/2023 16:17   US  BREAST COMPLETE UNI LEFT INC AXILLA Result Date: 05/01/2023 CLINICAL DATA:  23 year old female with a palpable area of concern in the left breast. She reports history of right breast abscesses, previously treated with antibiotics. EXAM: ULTRASOUND OF THE LEFT BREAST COMPARISON:  None available. FINDINGS: Per performing technologist, on physical exam, there is a superficial raised area along the medial aspect of the left breast, with associated erythema. Targeted left breast ultrasound was performed in the area of palpable concern. At 9 o'clock 2 cm from nipple, there is a superficial heterogeneous fluid collection within the subcuticular soft tissue, measuring 4.3 x 0.8 x 2.7 cm. There is surrounding hyperemia. IMPRESSION: The palpable area of concern in the left breast is most consistent with a superficial skin abscess/cellulitis. Recommend further assessment with ultrasound-guided aspiration, with fluid sent for microbiology. RECOMMENDATION: Left breast ultrasound-guided aspiration. BI-RADS CATEGORY  2: Benign. Electronically Signed   By: Sande Cromer M.D.   On: 05/01/2023 14:24   CT  HEAD WO CONTRAST ( ) Result Date: 05/01/2023 CLINICAL DATA:  Headache, neuro deficit; Neck trauma, focal neuro deficit or paresthesia (Age 25-64y) EXAM: CT HEAD WITHOUT CONTRAST CT CERVICAL SPINE WITHOUT CONTRAST TECHNIQUE: Multidetector CT imaging of the head and cervical spine was performed following the standard protocol without intravenous contrast. Multiplanar CT image reconstructions of the cervical spine were also generated. RADIATION DOSE REDUCTION: This exam was performed according to the departmental dose-optimization program which includes automated exposure control, adjustment of the mA and/or kV according to patient size and/or use of iterative reconstruction technique. COMPARISON:  CT head C-spine 01/25/2022, ultrasound thyroid  08/21/2021 FINDINGS: CT HEAD FINDINGS Brain: No evidence of large-territorial acute infarction. No parenchymal hemorrhage. No mass lesion. No extra-axial collection. No mass effect or midline shift. No hydrocephalus. Basilar cisterns are patent. Vascular: No hyperdense vessel. Skull: No acute fracture or focal lesion. Sinuses/Orbits: Paranasal sinuses and mastoid air cells are clear. The orbits are unremarkable. Other: None. CT CERVICAL SPINE FINDINGS Alignment: Normal. Skull base and vertebrae: No acute fracture. No aggressive appearing focal osseous lesion or focal pathologic process. Soft tissues and spinal canal: No prevertebral fluid or swelling. No visible canal hematoma. Upper chest: Unremarkable. Other: Prominent thyroid  gland. This has been evaluated on previous imaging. (ref: J Am Coll Radiol. 2015 Feb;12(2): 143-50). IMPRESSION: 1. No acute intracranial abnormality.  2. No acute displaced fracture or traumatic listhesis of the cervical spine. Electronically Signed   By: Morgane  Naveau M.D.   On: 05/01/2023 00:07   CT CERVICAL SPINE WO CONTRAST Result Date: 05/01/2023 CLINICAL DATA:  Headache, neuro deficit; Neck trauma, focal neuro deficit or paresthesia (Age  45-64y) EXAM: CT HEAD WITHOUT CONTRAST CT CERVICAL SPINE WITHOUT CONTRAST TECHNIQUE: Multidetector CT imaging of the head and cervical spine was performed following the standard protocol without intravenous contrast. Multiplanar CT image reconstructions of the cervical spine were also generated. RADIATION DOSE REDUCTION: This exam was performed according to the departmental dose-optimization program which includes automated exposure control, adjustment of the mA and/or kV according to patient size and/or use of iterative reconstruction technique. COMPARISON:  CT head C-spine 01/25/2022, ultrasound thyroid  08/21/2021 FINDINGS: CT HEAD FINDINGS Brain: No evidence of large-territorial acute infarction. No parenchymal hemorrhage. No mass lesion. No extra-axial collection. No mass effect or midline shift. No hydrocephalus. Basilar cisterns are patent. Vascular: No hyperdense vessel. Skull: No acute fracture or focal lesion. Sinuses/Orbits: Paranasal sinuses and mastoid air cells are clear. The orbits are unremarkable. Other: None. CT CERVICAL SPINE FINDINGS Alignment: Normal. Skull base and vertebrae: No acute fracture. No aggressive appearing focal osseous lesion or focal pathologic process. Soft tissues and spinal canal: No prevertebral fluid or swelling. No visible canal hematoma. Upper chest: Unremarkable. Other: Prominent thyroid  gland. This has been evaluated on previous imaging. (ref: J Am Coll Radiol. 2015 Feb;12(2): 143-50). IMPRESSION: 1. No acute intracranial abnormality. 2. No acute displaced fracture or traumatic listhesis of the cervical spine. Electronically Signed   By: Morgane  Naveau M.D.   On: 05/01/2023 00:07   DG Chest 2 View Result Date: 04/30/2023 CLINICAL DATA:  Seizure, tachycardia. EXAM: CHEST - 2 VIEW COMPARISON:  July 27, 2022. FINDINGS: The heart size and mediastinal contours are within normal limits. Both lungs are clear. The visualized skeletal structures are unremarkable. IMPRESSION: No  active cardiopulmonary disease. Electronically Signed   By: Rosalene Colon M.D.   On: 04/30/2023 18:28    Microbiology: Recent Results (from the past 240 hours)  MRSA Next Gen by PCR, Nasal     Status: None   Collection Time: 05/01/23  4:00 AM   Specimen: Nasal Mucosa; Nasal Swab  Result Value Ref Range Status   MRSA by PCR Next Gen NOT DETECTED NOT DETECTED Final    Comment: (NOTE) The GeneXpert MRSA Assay (FDA approved for NASAL specimens only), is one component of a comprehensive MRSA colonization surveillance program. It is not intended to diagnose MRSA infection nor to guide or monitor treatment for MRSA infections. Test performance is not FDA approved in patients less than 35 years old. Performed at [facility], [address] [place], [place] [address]   Aerobic/Anaerobic Culture w Gram Stain (surgical/deep wound)     Status: None (Preliminary result)   Collection Time: 05/01/23  4:10 PM   Specimen: Abscess  Result Value Ref Range Status   Specimen Description   Final    ABSCESS Performed at [facility], [address], [place], [place] [address]    Special Requests   Final    LEFT BREAST Performed at [facility], [address] [place], [place] [address]    Gram Stain   Final    MODERATE WBC PRESENT,BOTH PMN AND MONONUCLEAR NO ORGANISMS SEEN Performed at [facility], [address], [place], [place] [address]    Culture   Final  RARE STAPHYLOCOCCUS AUREUS SUSCEPTIBILITIES TO FOLLOW NO ANAEROBES ISOLATED; CULTURE IN PROGRESS FOR 5 DAYS    Report Status PENDING  Incomplete     Labs: Basic Metabolic Panel: Recent Labs  Lab 04/30/23 2014 05/01/23 0620 05/02/23 0430 05/03/23 2147  NA 136 136 131* 138  K 3.0* 4.0 4.6 3.5  CL 102 108 105 105  CO2 20* 21* 21* 24  GLUCOSE 168* 110* 375* 163*  BUN 16 17 24* 19  CREATININE 0.56 0.56 0.74 0.65  CALCIUM  9.2 9.1 9.0 8.9  MG 2.0 2.1 2.2  --   PHOS 3.9 3.1 4.4  --     Liver Function Tests: Recent Labs  Lab 04/30/23 2014 05/01/23 0620 05/02/23 0430 05/03/23 2147  AST 46* 39  --  21  ALT 38 39  --  28  ALKPHOS 83 74  --  65  BILITOT 1.7* 1.3*  --  0.7  PROT 7.2 7.1  --  6.7  ALBUMIN 3.8 3.8 3.7 3.8   No results for input(s): "LIPASE", "AMYLASE" in the last 168 hours. No results for input(s): "AMMONIA" in the last 168 hours. CBC: Recent Labs  Lab 04/30/23 2014 05/01/23 0620 05/02/23 0430 05/03/23 2147  WBC 11.0* 9.8 7.6 11.5*  NEUTROABS  --   --   --  3.6  HGB 13.5 12.0 11.3* 12.1  HCT 40.5 36.7 33.9* 37.7  MCV 72.5* 73.8* 74.2* 75.2*  PLT 255 219 190 218   Cardiac Enzymes: No results for input(s): "CKTOTAL", "CKMB", "CKMBINDEX", "TROPONINI" in the last 168 hours. BNP: BNP (last 3 results) No results for input(s): "BNP" in the last 8760 hours.  ProBNP (last 3 results) No results for input(s): "PROBNP" in the last 8760 hours.  CBG: Recent Labs  Lab 05/01/23 1636 05/01/23 2114 05/02/23 0814 05/02/23 1200 05/04/23 1157  GLUCAP 186* 318* 262* 122* 109*       Signed:  Raymonde Calico MD.  [facility] 05/04/2023, 12:26 PM

## 2023-05-04 NOTE — Progress Notes (Signed)
 Anticoagulation monitoring(Lovenox ):  23 yo female ordered Lovenox  40 mg Q24h    Filed Weights   05/03/23 2143  Weight: 85.7 kg (189 lb)   BMI 33.5   Lab Results  Component Value Date   CREATININE 0.65 05/03/2023   CREATININE 0.74 05/02/2023   CREATININE 0.56 05/01/2023   Estimated Creatinine Clearance: 112.5 mL/min (by C-G formula based on SCr of 0.65 mg/dL). Hemoglobin & Hematocrit     Component Value Date/Time   HGB 12.1 05/03/2023 2147   HGB 12.8 10/05/2018 1133   HCT 37.7 05/03/2023 2147   HCT 40.8 10/05/2018 1133     Per Protocol for Patient with estCrcl > 30 ml/min and BMI > 30, will transition to Lovenox  42.5 mg Q24h.

## 2023-05-04 NOTE — ED Notes (Signed)
 Pt consistently on call bell requesting 30mg  oxycodone  immediate release. Pt asking every staff member that enters room for prescription. Prescription not found in home med list. RN concerned as to circumstances of last discharge. MD messaged. Pt not exhibiting any signs/symptoms of pain. A&Ox4. Ambulating without assistance to bathroom and back to room.

## 2023-05-04 NOTE — Assessment & Plan Note (Addendum)
 Questionable pericarditis though appears unchanged from prior Echocardiogram from 4/26 showed no effusion Received Toradol  in the ED Will treat empirically with toradol  prn Nonnarcotic pain control Get sed rate and CRP Will defer to cardiology for diagnosis and decide on treatment

## 2023-05-04 NOTE — Assessment & Plan Note (Signed)
 Continue basal insulin Sliding scale coverage

## 2023-05-04 NOTE — Assessment & Plan Note (Addendum)
 History of psychogenic nonepileptic seizures Had recent negative EEG Continue antiepileptic with Ativan  if seizure Seizure precautions Neurology consulted-patient states she is on Lamictal  (pending verification)

## 2023-05-04 NOTE — Assessment & Plan Note (Signed)
 TSH suppressed to less than 0.01 likely due to noncompliance Resume home methimazole  and propranolol 

## 2023-05-04 NOTE — ED Notes (Signed)
Pt given a bag lunch per request.

## 2023-05-06 LAB — AEROBIC/ANAEROBIC CULTURE W GRAM STAIN (SURGICAL/DEEP WOUND)

## 2023-05-23 ENCOUNTER — Inpatient Hospital Stay

## 2023-05-23 ENCOUNTER — Emergency Department

## 2023-05-23 ENCOUNTER — Inpatient Hospital Stay
Admission: EM | Admit: 2023-05-23 | Discharge: 2023-05-25 | DRG: 880 | Disposition: A | Discharge status: Home / Self Care | Attending: Osteopathic Medicine | Admitting: Osteopathic Medicine

## 2023-05-23 ENCOUNTER — Other Ambulatory Visit: Payer: Self-pay

## 2023-05-23 DIAGNOSIS — Z87898 Personal history of other specified conditions: Secondary | ICD-10-CM

## 2023-05-23 DIAGNOSIS — E66811 Obesity, class 1: Secondary | ICD-10-CM | POA: Diagnosis present

## 2023-05-23 DIAGNOSIS — R569 Unspecified convulsions: Secondary | ICD-10-CM | POA: Diagnosis not present

## 2023-05-23 DIAGNOSIS — Z886 Allergy status to analgesic agent status: Secondary | ICD-10-CM | POA: Diagnosis not present

## 2023-05-23 DIAGNOSIS — R7989 Other specified abnormal findings of blood chemistry: Principal | ICD-10-CM

## 2023-05-23 DIAGNOSIS — G8929 Other chronic pain: Secondary | ICD-10-CM | POA: Diagnosis present

## 2023-05-23 DIAGNOSIS — Z91148 Patient's other noncompliance with medication regimen for other reason: Secondary | ICD-10-CM | POA: Diagnosis not present

## 2023-05-23 DIAGNOSIS — Z62810 Personal history of physical and sexual abuse in childhood: Secondary | ICD-10-CM | POA: Diagnosis not present

## 2023-05-23 DIAGNOSIS — F1123 Opioid dependence with withdrawal: Secondary | ICD-10-CM | POA: Diagnosis not present

## 2023-05-23 DIAGNOSIS — E0590 Thyrotoxicosis, unspecified without thyrotoxic crisis or storm: Secondary | ICD-10-CM | POA: Diagnosis present

## 2023-05-23 DIAGNOSIS — Z79899 Other long term (current) drug therapy: Secondary | ICD-10-CM | POA: Diagnosis not present

## 2023-05-23 DIAGNOSIS — F17290 Nicotine dependence, other tobacco product, uncomplicated: Secondary | ICD-10-CM | POA: Diagnosis present

## 2023-05-23 DIAGNOSIS — Z8249 Family history of ischemic heart disease and other diseases of the circulatory system: Secondary | ICD-10-CM

## 2023-05-23 DIAGNOSIS — Z794 Long term (current) use of insulin: Secondary | ICD-10-CM

## 2023-05-23 DIAGNOSIS — K2970 Gastritis, unspecified, without bleeding: Secondary | ICD-10-CM | POA: Diagnosis present

## 2023-05-23 DIAGNOSIS — F445 Conversion disorder with seizures or convulsions: Principal | ICD-10-CM | POA: Diagnosis present

## 2023-05-23 DIAGNOSIS — K3184 Gastroparesis: Secondary | ICD-10-CM | POA: Diagnosis present

## 2023-05-23 DIAGNOSIS — F4010 Social phobia, unspecified: Secondary | ICD-10-CM | POA: Diagnosis present

## 2023-05-23 DIAGNOSIS — Z560 Unemployment, unspecified: Secondary | ICD-10-CM

## 2023-05-23 DIAGNOSIS — M549 Dorsalgia, unspecified: Secondary | ICD-10-CM | POA: Insufficient documentation

## 2023-05-23 DIAGNOSIS — F4320 Adjustment disorder, unspecified: Secondary | ICD-10-CM | POA: Diagnosis present

## 2023-05-23 DIAGNOSIS — E876 Hypokalemia: Secondary | ICD-10-CM | POA: Diagnosis present

## 2023-05-23 DIAGNOSIS — E1169 Type 2 diabetes mellitus with other specified complication: Secondary | ICD-10-CM

## 2023-05-23 DIAGNOSIS — F1910 Other psychoactive substance abuse, uncomplicated: Secondary | ICD-10-CM | POA: Diagnosis present

## 2023-05-23 DIAGNOSIS — F411 Generalized anxiety disorder: Secondary | ICD-10-CM | POA: Diagnosis present

## 2023-05-23 DIAGNOSIS — K921 Melena: Secondary | ICD-10-CM | POA: Insufficient documentation

## 2023-05-23 DIAGNOSIS — R000 Tachycardia, unspecified: Secondary | ICD-10-CM | POA: Diagnosis present

## 2023-05-23 DIAGNOSIS — E1143 Type 2 diabetes mellitus with diabetic autonomic (poly)neuropathy: Secondary | ICD-10-CM | POA: Diagnosis present

## 2023-05-23 DIAGNOSIS — E119 Type 2 diabetes mellitus without complications: Secondary | ICD-10-CM

## 2023-05-23 DIAGNOSIS — N61 Mastitis without abscess: Secondary | ICD-10-CM | POA: Diagnosis present

## 2023-05-23 DIAGNOSIS — F332 Major depressive disorder, recurrent severe without psychotic features: Secondary | ICD-10-CM | POA: Diagnosis present

## 2023-05-23 DIAGNOSIS — Z833 Family history of diabetes mellitus: Secondary | ICD-10-CM

## 2023-05-23 DIAGNOSIS — E1043 Type 1 diabetes mellitus with diabetic autonomic (poly)neuropathy: Secondary | ICD-10-CM | POA: Diagnosis present

## 2023-05-23 DIAGNOSIS — R55 Syncope and collapse: Secondary | ICD-10-CM

## 2023-05-23 DIAGNOSIS — Z91018 Allergy to other foods: Secondary | ICD-10-CM

## 2023-05-23 DIAGNOSIS — Z6830 Body mass index (BMI) 30.0-30.9, adult: Secondary | ICD-10-CM

## 2023-05-23 LAB — GLUCOSE, CAPILLARY
Glucose-Capillary: 226 mg/dL — ABNORMAL HIGH (ref 70–99)
Glucose-Capillary: 250 mg/dL — ABNORMAL HIGH (ref 70–99)
Glucose-Capillary: 118 mg/dL — ABNORMAL HIGH (ref 70–99)

## 2023-05-23 LAB — CBC
HCT: 35.6 % — ABNORMAL LOW (ref 36.0–46.0)
Hemoglobin: 11.8 g/dL — ABNORMAL LOW (ref 12.0–15.0)
MCH: 24.4 pg — ABNORMAL LOW (ref 26.0–34.0)
MCHC: 33.1 g/dL (ref 30.0–36.0)
MCV: 73.7 fL — ABNORMAL LOW (ref 80.0–100.0)
Platelets: 211 10*3/uL (ref 150–400)
RBC: 4.83 MIL/uL (ref 3.87–5.11)
RDW: 14.4 % (ref 11.5–15.5)
WBC: 8.7 10*3/uL (ref 4.0–10.5)
nRBC: 0 % (ref 0.0–0.2)

## 2023-05-23 LAB — HCG, QUANTITATIVE, PREGNANCY: hCG, Beta Chain, Quant, S: <1 m[IU]/mL (ref <5)

## 2023-05-23 LAB — BASIC METABOLIC PANEL WITH GFR
Anion gap: 12 (ref 5–15)
BUN: 9 mg/dL (ref 6–20)
CO2: 22 mmol/L (ref 22–32)
Calcium: 9.3 mg/dL (ref 8.9–10.3)
Chloride: 100 mmol/L (ref 98–111)
Creatinine, Ser: 0.4 mg/dL — ABNORMAL LOW (ref 0.44–1.00)
GFR, Estimated: >60 mL/min (ref >60)
Glucose, Bld: 272 mg/dL — ABNORMAL HIGH (ref 70–99)
Potassium: 2.9 mmol/L — ABNORMAL LOW (ref 3.5–5.1)
Sodium: 134 mmol/L — ABNORMAL LOW (ref 135–145)

## 2023-05-23 LAB — CBG MONITORING, ED: Glucose-Capillary: 244 mg/dL — ABNORMAL HIGH (ref 70–99)

## 2023-05-23 LAB — MAGNESIUM: Magnesium: 1.8 mg/dL (ref 1.7–2.4)

## 2023-05-23 LAB — MRSA NEXT GEN BY PCR, NASAL: MRSA by PCR Next Gen: NOT DETECTED

## 2023-05-23 LAB — T4, FREE: Free T4: 3.98 ng/dL — ABNORMAL HIGH (ref 0.61–1.12)

## 2023-05-23 LAB — TSH: TSH: <0.01 u[IU]/mL — ABNORMAL LOW (ref 0.350–4.500)

## 2023-05-23 MED ORDER — CHLORHEXIDINE GLUCONATE CLOTH 2 % EX PADS
6.0000 | MEDICATED_PAD | Freq: Every day | CUTANEOUS | Status: DC
  Administered 2023-05-24: 6 via TOPICAL

## 2023-05-23 MED ORDER — POTASSIUM CHLORIDE CRYS ER 20 MEQ PO TBCR: 20.0000 meq | EXTENDED_RELEASE_TABLET | Freq: Once | ORAL | Status: DC

## 2023-05-23 MED ORDER — LORAZEPAM 2 MG/ML IJ SOLN
1.0000 mg | Freq: Once | INTRAMUSCULAR | Status: AC
  Administered 2023-05-23: 1 mg via INTRAVENOUS
  Filled 2023-05-23: qty 1

## 2023-05-23 MED ORDER — ONDANSETRON HCL 4 MG PO TABS
4.0000 mg | ORAL_TABLET | Freq: Four times a day (QID) | ORAL | Status: DC | PRN
Start: 2023-05-23 — End: 2023-05-28
  Administered 2023-05-23 – 2023-05-25 (×5): 4 mg via ORAL
  Filled 2023-05-23 (×5): qty 1

## 2023-05-23 MED ORDER — METHIMAZOLE 10 MG PO TABS
20.0000 mg | ORAL_TABLET | ORAL | Status: AC
  Administered 2023-05-23: 20 mg via ORAL
  Filled 2023-05-23: qty 2

## 2023-05-23 MED ORDER — POTASSIUM CHLORIDE CRYS ER 20 MEQ PO TBCR
40.0000 meq | EXTENDED_RELEASE_TABLET | Freq: Once | ORAL | Status: AC
  Administered 2023-05-23: 40 meq via ORAL
  Filled 2023-05-23: qty 2

## 2023-05-23 MED ORDER — MORPHINE SULFATE (PF) 4 MG/ML IV SOLN
4.0000 mg | INTRAVENOUS | Status: DC | PRN
  Administered 2023-05-23 – 2023-05-24 (×4): 4 mg via INTRAVENOUS
  Filled 2023-05-23 (×4): qty 1

## 2023-05-23 MED ORDER — METOPROLOL TARTRATE 5 MG/5ML IV SOLN
5.0000 mg | INTRAVENOUS | Status: AC
  Administered 2023-05-23: 5 mg via INTRAVENOUS
  Filled 2023-05-23: qty 5

## 2023-05-23 MED ORDER — ONDANSETRON HCL 4 MG/2ML IJ SOLN
4.0000 mg | Freq: Four times a day (QID) | INTRAMUSCULAR | Status: DC | PRN
  Filled 2023-05-23: qty 2

## 2023-05-23 MED ORDER — INSULIN ASPART 100 UNIT/ML IJ SOLN
0.0000 [IU] | Freq: Three times a day (TID) | INTRAMUSCULAR | Status: DC
  Administered 2023-05-23: 5 [IU] via SUBCUTANEOUS
  Administered 2023-05-24 (×3): 3 [IU] via SUBCUTANEOUS
  Administered 2023-05-25: 5 [IU] via SUBCUTANEOUS
  Filled 2023-05-23 (×5): qty 1

## 2023-05-23 MED ORDER — INSULIN ASPART 100 UNIT/ML IJ SOLN
0.0000 [IU] | Freq: Every day | INTRAMUSCULAR | Status: DC
  Administered 2023-05-24: 2 [IU] via SUBCUTANEOUS
  Filled 2023-05-23: qty 1

## 2023-05-23 MED ORDER — POTASSIUM CITRATE-CITRIC ACID 1100-334 MG/5ML PO SOLN
20.0000 meq | Freq: Once | ORAL | Status: AC
  Administered 2023-05-23: 20 meq via ORAL
  Filled 2023-05-23: qty 10

## 2023-05-23 MED ORDER — LIDOCAINE 5 % EX PTCH
3.0000 | MEDICATED_PATCH | Freq: Every day | CUTANEOUS | Status: DC | PRN
  Administered 2023-05-23: 2 via TRANSDERMAL
  Administered 2023-05-24: 1 via TRANSDERMAL
  Filled 2023-05-23 (×3): qty 3

## 2023-05-23 MED ORDER — MORPHINE SULFATE (PF) 4 MG/ML IV SOLN
4.0000 mg | Freq: Once | INTRAVENOUS | Status: AC
  Administered 2023-05-23: 4 mg via INTRAVENOUS
  Filled 2023-05-23: qty 1

## 2023-05-23 MED ORDER — METHIMAZOLE 10 MG PO TABS
20.0000 mg | ORAL_TABLET | Freq: Two times a day (BID) | ORAL | Status: DC
  Administered 2023-05-23 – 2023-05-25 (×4): 20 mg via ORAL
  Filled 2023-05-23 (×4): qty 2

## 2023-05-23 MED ORDER — LORAZEPAM 2 MG/ML IJ SOLN: 1.0000 mg | INTRAMUSCULAR | Status: DC | PRN

## 2023-05-23 MED ORDER — MELATONIN 5 MG PO TABS
5.0000 mg | ORAL_TABLET | Freq: Once | ORAL | Status: AC
  Administered 2023-05-23: 5 mg via ORAL
  Filled 2023-05-23: qty 1

## 2023-05-23 MED ORDER — PROPRANOLOL HCL 40 MG PO TABS
80.0000 mg | ORAL_TABLET | Freq: Three times a day (TID) | ORAL | Status: DC
  Administered 2023-05-23 – 2023-05-25 (×6): 80 mg via ORAL
  Filled 2023-05-23: qty 2
  Filled 2023-05-23: qty 4
  Filled 2023-05-23 (×3): qty 2
  Filled 2023-05-23: qty 4
  Filled 2023-05-23: qty 2

## 2023-05-23 MED ORDER — HEPARIN SODIUM (PORCINE) 5000 UNIT/ML IJ SOLN
5000.0000 [IU] | Freq: Three times a day (TID) | INTRAMUSCULAR | Status: DC
  Administered 2023-05-24 – 2023-05-25 (×4): 5000 [IU] via SUBCUTANEOUS
  Filled 2023-05-23 (×4): qty 1

## 2023-05-23 MED ORDER — FENTANYL CITRATE PF 50 MCG/ML IJ SOSY
25.0000 ug | PREFILLED_SYRINGE | INTRAMUSCULAR | Status: DC | PRN
  Administered 2023-05-23 – 2023-05-24 (×4): 25 ug via INTRAVENOUS
  Filled 2023-05-23 (×4): qty 1

## 2023-05-23 MED ORDER — ACETAMINOPHEN 325 MG PO TABS
650.0000 mg | ORAL_TABLET | Freq: Four times a day (QID) | ORAL | Status: DC | PRN
  Filled 2023-05-23: qty 2

## 2023-05-23 MED ORDER — ACETAMINOPHEN 650 MG RE SUPP: 650.0000 mg | Freq: Four times a day (QID) | RECTAL | Status: DC | PRN

## 2023-05-23 NOTE — Assessment & Plan Note (Signed)
 Psychiatry service has been consulted by EDP, appreciate their recommendations

## 2023-05-23 NOTE — ED Triage Notes (Signed)
 Pt BIB [facility] from Home for seizures EMS reporting seizing upon their arrival states Fire department reports 4x seizure before EMS arrival, mom reporting pt seizing throughout the night. EMS gave 5mg  versed , pt stopped seizing but had another seizure en route to facility, stopped before more versed  was given.

## 2023-05-23 NOTE — Significant Event (Signed)
       CROSS COVER NOTE  NAME: Nancy [name] MRN: [id number] DOB : Mar 23, 2000 ATTENDING PHYSICIAN: Cox, Amy Neale Bale, DO    Date of Service   05/23/2023   HPI/Events of Note   Request for patient/chart review for transfer per admin Recently admitted by Dr Reinhold Carbine still on service at time She indicated patient could be downgraded  "in a bed crunch and admin asking if we could possibly down grade this patient. Any thought?   Yes   Interventions   Assessment/Plan: Transfer to medsurg        Kip Peon NP [facility] Cross Cover 7pm-7am - check amion for availability Pager [phone]

## 2023-05-23 NOTE — ED Notes (Addendum)
 Pt stating still having, abd/back/neck pain. 7/10. Dr Valetta Gaudy made aware.

## 2023-05-23 NOTE — Assessment & Plan Note (Signed)
 Insulin SSI with at bedtime coverage ordered

## 2023-05-23 NOTE — Assessment & Plan Note (Signed)
 Query Pepto-Bismol use in setting of stomach upset and diarrhea Patient appears to have baseline hemoglobin consistent with female on her menstrual cycle Continue to monitor Low clinical suspicion for GI bleed at this time If patient continues to have tarry stool in the hospital, would recommend a.m. team to consider consultation to GI Repeat CBC in the a.m.

## 2023-05-23 NOTE — Assessment & Plan Note (Addendum)
 Secondary to thyroid  medication nonretention in setting of medication induced vomiting Counsel patient on taking her hyperthyroid medications with food as she is able to keep food down. Patient endorses understanding and compliance Treatment per above

## 2023-05-23 NOTE — ED Notes (Signed)
 Pt returned from CT

## 2023-05-23 NOTE — H&P (Addendum)
 History and Physical   Nancy [name] ZOX:[id number] DOB: [birth year] DOA: 05/23/2023  PCP: Zoila Hines, FNP  Outpatient Specialists: Dr. Jolyne Needs, [facility] Patient coming from: Home via EMS  I have personally briefly reviewed patient's old medical records in [facility] EMR.  Chief Concern: Seizures  HPI: Mr. Nancy [name] is a 23 year old female with history of hyperthyroid, insulin -dependent diabetes mellitus, pseudoseizure, presents emergency department for chief concerns of seizure.  Per report, patient has been having seizures multiple times throughout the night.  Vitals in the ED showed temperature of 99, rr of 18, heart rate 114, blood pressure 151/86, SpO2 100% on room air.  Serum sodium is 134, potassium 2.9, chloride 100, bicarb 22, BUN of 9, serum creatinine 0.40, EGFR greater than 60, nonfasting blood glucose 272, WBC 8.7, hemoglobin 11.8, platelets of 211.  ED treatment: Methimazole  20 mg p.o. one-time dose, metoprolol  5 mg IV one-time dose, morphine  4 mg IV one-time dose, potassium chloride  40 miEq p.o., Ativan  1 mg IV one-time dose. ------------------------------------- At bedside, patient is able to tell me her first and last name, age, occasion, current calendar year.  She reports that she is compliant with medications.  She takes her medications as prescribed however sometimes the medication causes her to have vomiting so she believes that her medications are not being absorbed into her body.  She denies fever, chills, current nausea, chest pain, shortness of breath, dysuria, hematuria, swelling of the lower extremities.  She reports diarrhea that started about 2 days ago and states that her stool has been black tarry in color. She denies taking iron tablets.  She denies NSAID use including over-the-counter BC powder, Goody powder.  She infrequently drinks EtOH, her last alcohol drink was 2 weeks ago.  She reports she is currently on her menstrual  cycle.  Social history: She lives at home with her mother.  She denies tobacco and recreational drug use.  She infrequently uses EtOH.  ROS: Constitutional: no weight change, no fever ENT/Mouth: no sore throat, no rhinorrhea Eyes: no eye pain, no vision changes Cardiovascular: no chest pain, no dyspnea,  no edema, no palpitations Respiratory: no cough, no sputum, no wheezing Gastrointestinal: + nausea, + vomiting, + diarrhea, no constipation Genitourinary: no urinary incontinence, no dysuria, no hematuria Musculoskeletal: no arthralgias, no myalgias Skin: no skin lesions, no pruritus, Neuro: + weakness, no loss of consciousness, no syncope Psych: no anxiety, no depression, + decrease appetite Heme/Lymph: no bruising, no bleeding  ED Course: Discussed with EDP, patient requiring hospitalization for chief concerns of seizure.  Assessment/Plan  Principal Problem:   Seizure-like activity (HCC) Active Problems:   Tachycardia   Hyperthyroidism   T2DM (type 2 diabetes mellitus) (HCC)   History of psychogenic nonepileptic seizure   Hypokalemia   Adjustment disorder   MDD (major depressive disorder), recurrent severe, without psychosis (HCC)   Severe recurrent major depression without psychotic features (HCC)   Psychogenic nonepileptic seizure   Diabetic gastroparesis (HCC)   Polysubstance abuse (HCC)   Gastritis   Musculoskeletal back pain   Tarry stool   Assessment and Plan:  * Seizure-like activity (HCC) Suspect pseudoseizures Ativan  1 mg IV as needed for seizure, 3 doses ordered with instruction to administer as appropriate and let provider know Seizure, aspiration precaution, fall precautions  Hyperthyroidism With medication retention difficulty due to her experiencing frequent nausea and vomiting Patient is not in thyroid  storm at this time Burch Wartofsky scale = 10 Home propranolol  80 mg p.o. 3 times daily, methimazole   20 mg p.o. twice daily were resumed on  admission  Tachycardia Secondary to thyroid  medication nonretention in setting of medication induced vomiting Counsel patient on taking her hyperthyroid medications with food as she is able to keep food down. Patient endorses understanding and compliance Treatment per above  T2DM (type 2 diabetes mellitus) (HCC) Insulin  SSI with at bedtime coverage ordered  Hypokalemia Magnesium  level was within normal limits Status post potassium chloride  40 mE p.o. one-time dose per EDP Polycitra 20 mEq p.o. one-time dose ordered on admission Recheck BMP in a.m.q   Tarry stool Query Pepto-Bismol use in setting of stomach upset and diarrhea Patient appears to have baseline hemoglobin consistent with female on her menstrual cycle Continue to monitor Low clinical suspicion for GI bleed at this time If patient continues to have tarry stool in the hospital, would recommend a.m. team to consider consultation to GI Repeat CBC in the a.m.  Musculoskeletal back pain Per patient, she banged herself by laundry machine during her seizures Lidocaine  patches ordered; morphine  4 mg IV q4h prn for moderate pain; fentanyl  25 mcg q4h prn for severe pain, 20 hours of coverage ordered  Psychogenic nonepileptic seizure Psychiatry service has been consulted by EDP, appreciate their recommendations  Severe recurrent major depression without psychotic features (HCC) Behavior health has been consulted by EDP  Chart reviewed.   DVT prophylaxis: Heparin  5000 units subcutaneous every 8 hours Code Status: Full code Diet: Regular diet Family Communication: No Disposition Plan: Pending clinical course Consults called: None at this time Admission status: Stepdown, inpatient  Past Medical History:  Diagnosis Date   Acanthosis nigricans    Anxiety    CHF (congestive heart failure) (HCC)    Chronic lower back pain    Depression    DKA, type 1 (HCC) 09/13/2018   Dyspepsia    Obesity    Ovarian cyst    pt is not  aware of this hx (11/24/2017)   Precocious adrenarche (HCC)    Premature baby    Seizures (HCC)    Type II diabetes mellitus (HCC)    insulin  dependant   Past Surgical History:  Procedure Laterality Date   ABDOMINAL HERNIA REPAIR     "I was a baby"   BIOPSY  10/12/2018   Procedure: BIOPSY;  Surgeon: Lajuan Pila, MD;  Location: [facility] ENDOSCOPY;  Service: Endoscopy;;   BIOPSY  02/28/2020   Procedure: BIOPSY;  Surgeon: Annis Kinder, DO;  Location: [facility] ENDOSCOPY;  Service: Gastroenterology;;   ESOPHAGOGASTRODUODENOSCOPY (EGD) WITH PROPOFOL  N/A 10/12/2018   Procedure: ESOPHAGOGASTRODUODENOSCOPY (EGD) WITH PROPOFOL ;  Surgeon: Lajuan Pila, MD;  Location: [facility] ENDOSCOPY;  Service: Endoscopy;  Laterality: N/A;   ESOPHAGOGASTRODUODENOSCOPY (EGD) WITH PROPOFOL  N/A 02/28/2020   Procedure: ESOPHAGOGASTRODUODENOSCOPY (EGD) WITH PROPOFOL ;  Surgeon: Annis Kinder, DO;  Location: [facility] ENDOSCOPY;  Service: Gastroenterology;  Laterality: N/A;   FLEXIBLE SIGMOIDOSCOPY N/A 02/28/2020   Procedure: FLEXIBLE SIGMOIDOSCOPY;  Surgeon: Annis Kinder, DO;  Location: [facility] ENDOSCOPY;  Service: Gastroenterology;  Laterality: N/A;   HERNIA REPAIR     LEFT HEART CATH AND CORONARY ANGIOGRAPHY N/A 10/13/2018   Procedure: LEFT HEART CATH AND CORONARY ANGIOGRAPHY;  Surgeon: Odie Benne, MD;  Location: [facility] INVASIVE CV LAB;  Service: Cardiovascular;  Laterality: N/A;   TONSILLECTOMY AND ADENOIDECTOMY     WISDOM TOOTH EXTRACTION  2017   Social History:  reports that she has been smoking e-cigarettes. She has never used smokeless tobacco. She reports that she does not drink alcohol and does not use  drugs.  Allergies  Allergen Reactions   Oatmeal Anaphylaxis   Tomato Anaphylaxis   Acetaminophen  Other (See Comments)    Avoids due to liver   Ibuprofen  Other (See Comments)    GI MD said to not take this anymore   Family History  Problem Relation Age of Onset   Diabetes Mother    Hypertension Mother     Obesity Mother    Asthma Mother    Allergic rhinitis Mother    Eczema Mother    Cervical cancer Mother    Diabetes Father    Hypertension Father    Obesity Father    Hyperlipidemia Father    Hypertension Paternal Aunt    Hypertension Maternal Grandfather    Colon cancer Maternal Grandfather    Diabetes Paternal Grandmother    Obesity Paternal Grandmother    Diabetes Paternal Grandfather    Obesity Paternal Grandfather    Angioedema Neg Hx    Immunodeficiency Neg Hx    Urticaria Neg Hx    Stomach cancer Neg Hx    Esophageal cancer Neg Hx    Family history: Family history reviewed and not pertinent.  Prior to Admission medications   Medication Sig Start Date End Date Taking? Authorizing Provider  Insulin  Aspart FlexPen (NOVOLOG ) 100 UNIT/ML Inject 15 Units into the skin as directed. Sliding scale insulin  as follows: BG 150-200  = 2 units BG  200-250 = 4 units BG  250-300 = 6 units BG  300-350 = 8 units BG  350-400 = 10 units 01/09/23  Yes [provider]  insulin  glargine (LANTUS ) 100 unit/mL SOPN Inject 20 Units into the skin 2 (two) times daily. Patient taking differently: Inject 8 Units into the skin 2 (two) times daily. 08/23/21  Yes Wouk, Haynes Lips, MD  furosemide  (LASIX ) 20 MG tablet Take 50 mg by mouth daily as needed for fluid or edema. Patient not taking: Reported on 05/23/2023    [provider]  Glucagon  (BAQSIMI  TWO PACK) 3 MG/DOSE POWD Place 1 spray into the nose as needed. 08/23/21   Wouk, Haynes Lips, MD  methimazole  (TAPAZOLE ) 10 MG tablet Take 2 tablets (20 mg total) by mouth 2 (two) times daily. 10/05/21   Garrison Kanner, MD  ondansetron  (ZOFRAN ) 4 MG tablet Take 1 tablet (4 mg total) by mouth daily as needed for nausea or vomiting. Patient not taking: Reported on 05/23/2023 10/10/22 10/10/23  Buell Carmin, MD  promethazine  (PHENERGAN ) 25 MG suppository Place 25 mg rectally every 8 (eight) hours as needed for nausea or vomiting. Patient not taking:  Reported on 05/23/2023 05/23/22   [provider]  propranolol  (INDERAL ) 80 MG tablet Take 1 tablet (80 mg total) by mouth 3 (three) times daily. Patient not taking: Reported on 05/23/2023 10/05/21   Garrison Kanner, MD  QUEtiapine  (SEROQUEL ) 100 MG tablet Take 400 mg by mouth at bedtime. Patient not taking: Reported on 05/23/2023    [provider]  QUEtiapine  (SEROQUEL ) 50 MG tablet Take 50 mg by mouth 2 (two) times daily. Patient not taking: Reported on 05/23/2023    [provider]  prochlorperazine  (COMPAZINE ) 25 MG suppository PLACE 1 SUPPOSITORY (25 MG TOTAL) RECTALLY EVERY TWELVE HOURS AS NEEDED FOR NAUSEA OR VOMITING. 02/29/20 02/29/20  Burton Casey, MD   Physical Exam: Vitals:   05/23/23 1300 05/23/23 1338 05/23/23 1406 05/23/23 1500  BP: (!) 145/87 (!) 132/53 138/79 123/72  Pulse: (!) 101 (!) 111  (!) 101  Resp: (!) 23  16 16  Temp:   99 F (37.2 C)   TempSrc:   Oral   SpO2: 100%  98% 99%  Weight:   78 kg   Height:   5\' 3"  (1.6 m)    Constitutional: appears age-appropriate Eyes: PERRL, lids and conjunctivae normal ENMT: Mucous membranes are moist. Posterior pharynx clear of any exudate or lesions. Age-appropriate dentition. Hearing appropriate Neck: normal, supple, no masses, no thyromegaly Respiratory: clear to auscultation bilaterally, no wheezing, no crackles. Normal respiratory effort. No accessory muscle use.  Cardiovascular: Regular rate and rhythm, no murmurs / rubs / gallops. No extremity edema. 2+ pedal pulses. No carotid bruits.  Abdomen: no tenderness, no masses palpated, no hepatosplenomegaly. Bowel sounds positive.  Musculoskeletal: no clubbing / cyanosis. No joint deformity upper and lower extremities. Good ROM, no contractures, no atrophy. Normal muscle tone.  Skin: no rashes, lesions, ulcers. No induration Neurologic: Sensation intact. Strength 5/5 in all 4.  Psychiatric: Normal judgment and insight. Alert and oriented x 3. Normal mood.    EKG: independently reviewed, showing sinus tachycardia with rate of 121, QTc 507  Chest x-ray on Admission: I personally reviewed and I agree with radiologist reading as below.  DG Chest Port 1 View Result Date: 05/23/2023 CLINICAL DATA:  Seizure EXAM: PORTABLE CHEST 1 VIEW COMPARISON:  04/30/2023 FINDINGS: Single frontal view of the chest demonstrates a stable cardiac silhouette. No airspace disease, effusion, or pneumothorax. No acute bony abnormalities. IMPRESSION: 1. No acute intrathoracic process. Electronically Signed   By: Bobbye Burrow M.D.   On: 05/23/2023 14:00   CT Head Wo Contrast Result Date: 05/23/2023 CLINICAL DATA:  Provided history: Seizure, new onset, no history of trauma. Neck trauma. EXAM: CT HEAD WITHOUT CONTRAST CT CERVICAL SPINE WITHOUT CONTRAST TECHNIQUE: Multidetector CT imaging of the head and cervical spine was performed following the standard protocol without intravenous contrast. Multiplanar CT image reconstructions of the cervical spine were also generated. RADIATION DOSE REDUCTION: This exam was performed according to the departmental dose-optimization program which includes automated exposure control, adjustment of the mA and/or kV according to patient size and/or use of iterative reconstruction technique. COMPARISON:  Head CT 05/03/2023. Cervical spine CT 05/03/2023. Thyroid  ultrasound 08/21/2021. FINDINGS: CT HEAD FINDINGS Brain: No age-advanced or lobar predominant cerebral atrophy. There is no acute intracranial hemorrhage. No demarcated cortical infarct. No extra-axial fluid collection. No evidence of an intracranial mass. No midline shift. Vascular: No hyperdense vessel. Skull: No calvarial fracture or aggressive osseous lesion. Sinuses/Orbits: No mass or acute finding within the imaged orbits. No significant paranasal sinus disease at the imaged levels. CT CERVICAL SPINE FINDINGS Alignment: Nonspecific reversal of the expected cervical lordosis. No significant  spondylolisthesis. Skull base and vertebrae: The basion-dental and atlanto-dental intervals are maintained.No evidence of acute fracture to the cervical spine. Soft tissues and spinal canal: No prevertebral fluid or swelling. No visible canal hematoma. Disc levels: No appreciable spinal canal stenosis. No significant bony neural foraminal narrowing. Upper chest: No consolidation within the imaged lung apices. No visible pneumothorax. Other: Enlarged and heterogeneous thyroid  gland, as was demonstrated on the prior thyroid  ultrasound of 08/21/2021. Please refer to this prior examination for further description. IMPRESSION: CT head: No evidence of an acute intracranial abnormality. CT cervical spine: 1. No evidence of an acute cervical spine fracture. 2. Nonspecific reversal of the expected cervical lordosis. Electronically Signed   By: Bascom Lily D.O.   On: 05/23/2023 11:20   CT Cervical Spine Wo Contrast Result Date: 05/23/2023 CLINICAL DATA:  Provided history: Seizure, new  onset, no history of trauma. Neck trauma. EXAM: CT HEAD WITHOUT CONTRAST CT CERVICAL SPINE WITHOUT CONTRAST TECHNIQUE: Multidetector CT imaging of the head and cervical spine was performed following the standard protocol without intravenous contrast. Multiplanar CT image reconstructions of the cervical spine were also generated. RADIATION DOSE REDUCTION: This exam was performed according to the departmental dose-optimization program which includes automated exposure control, adjustment of the mA and/or kV according to patient size and/or use of iterative reconstruction technique. COMPARISON:  Head CT 05/03/2023. Cervical spine CT 05/03/2023. Thyroid  ultrasound 08/21/2021. FINDINGS: CT HEAD FINDINGS Brain: No age-advanced or lobar predominant cerebral atrophy. There is no acute intracranial hemorrhage. No demarcated cortical infarct. No extra-axial fluid collection. No evidence of an intracranial mass. No midline shift. Vascular: No  hyperdense vessel. Skull: No calvarial fracture or aggressive osseous lesion. Sinuses/Orbits: No mass or acute finding within the imaged orbits. No significant paranasal sinus disease at the imaged levels. CT CERVICAL SPINE FINDINGS Alignment: Nonspecific reversal of the expected cervical lordosis. No significant spondylolisthesis. Skull base and vertebrae: The basion-dental and atlanto-dental intervals are maintained.No evidence of acute fracture to the cervical spine. Soft tissues and spinal canal: No prevertebral fluid or swelling. No visible canal hematoma. Disc levels: No appreciable spinal canal stenosis. No significant bony neural foraminal narrowing. Upper chest: No consolidation within the imaged lung apices. No visible pneumothorax. Other: Enlarged and heterogeneous thyroid  gland, as was demonstrated on the prior thyroid  ultrasound of 08/21/2021. Please refer to this prior examination for further description. IMPRESSION: CT head: No evidence of an acute intracranial abnormality. CT cervical spine: 1. No evidence of an acute cervical spine fracture. 2. Nonspecific reversal of the expected cervical lordosis. Electronically Signed   By: Bascom Lily D.O.   On: 05/23/2023 11:20   Labs on Admission: I have personally reviewed following labs  CBC: Recent Labs  Lab 05/23/23 1041  WBC 8.7  HGB 11.8*  HCT 35.6*  MCV 73.7*  PLT 211   Basic Metabolic Panel: Recent Labs  Lab 05/23/23 1041  NA 134*  K 2.9*  CL 100  CO2 22  GLUCOSE 272*  BUN 9  CREATININE 0.40*  CALCIUM  9.3  MG 1.8   GFR: Estimated Creatinine Clearance: 107.2 mL/min (A) (by C-G formula based on SCr of 0.4 mg/dL (L)).  CBG: Recent Labs  Lab 05/23/23 1112 05/23/23 1407  GLUCAP 244* 226*   Thyroid  Function Tests: Recent Labs    05/23/23 1041  TSH <0.010*  FREET4 3.98*   Urine analysis:    Component Value Date/Time   COLORURINE YELLOW (A) 05/01/2023 1535   APPEARANCEUR CLEAR (A) 05/01/2023 1535   LABSPEC  1.029 05/01/2023 1535   PHURINE 5.0 05/01/2023 1535   GLUCOSEU 150 (A) 05/01/2023 1535   HGBUR NEGATIVE 05/01/2023 1535   BILIRUBINUR NEGATIVE 05/01/2023 1535   BILIRUBINUR negative 07/13/2018 1651   KETONESUR 20 (A) 05/01/2023 1535   PROTEINUR NEGATIVE 05/01/2023 1535   UROBILINOGEN 0.2 07/13/2018 1651   UROBILINOGEN 1.0 07/09/2017 1324   NITRITE NEGATIVE 05/01/2023 1535   LEUKOCYTESUR NEGATIVE 05/01/2023 1535   CRITICAL CARE Performed by: Trenton Frock Kiley Solimine  Total critical care time: 32 minutes  Critical care time was exclusive of separately billable procedures and treating other patients.  Critical care was necessary to treat or prevent imminent or life-threatening deterioration.  Critical care was time spent personally by me on the following activities: development of treatment plan with patient as well as nursing, discussions with consultants, evaluation of patient's response to treatment, examination  of patient, obtaining history from patient or surrogate, ordering and performing treatments and interventions, ordering and review of laboratory studies, ordering and review of radiographic studies, pulse oximetry and re-evaluation of patient's condition.  This document was prepared using Dragon Voice Recognition software and may include unintentional dictation errors.  Dr. Reinhold Carbine [facility]  If 7PM-7AM, please contact overnight-coverage provider If 7AM-7PM, please contact day attending provider [email or web address]  05/23/2023, 4:02 PM

## 2023-05-23 NOTE — Hospital Course (Addendum)
 Hospital course / significant events:   HPI: Nancy [name] is a 23 year old female with history of hyperthyroid, insulin -dependent diabetes mellitus, pseudoseizure, presents emergency department for chief concerns of seizure - reports compliance w/ medications but frequent nausea/vomiting and she attributes possible malabsorption to this, reports diarrhea onset 2 days prior w/ black stool.  Records reviewed: Recently d/c from [facility] hospital 05/04/23 w/ seizures evaluated by neurology as nonepileptic, normal brain imaging at that time. Of note, on previous admission right before that (04/24-04/26) pt requested opioids and stated she was prescribed them at home, she even presented what appeared to be screenshot from [facility] documenting these Rx however after extensive searching by [facility] pharmacy team, it was concluded that screenshot was factitious and no recent opioid or other narcotic prescriptions were identified - when patient was asked about this she left AMA.   05/17: to ED, sinus tachy, other VSS. Was given Methimazole  20 mg p.o. one-time dose, metoprolol  5 mg IV one-time dose, morphine  4 mg IV one-time dose, potassium chloride  40 miEq p.o., Ativan  1 mg IV one-time dose. Admitted to hospitalist service  05/18: pt continues to be nauseous w/ abdominal pain she attributes to her chronic neuropathy. She presents screen shots of medications but pharmacy cannot confirm any controlled substance dispenses, pt does not have pill bottles wit her, she cannot name the pharmacy she states delivers these medications. I advised I will not keep her on IV medications or continue high dose opiates I cannot confirm. She also reports use of Seroquel  400 mg at bedtime and 50 mg bid prn, requests restart this medication, pharmacy states no fills on this rx >6 months. Pt and I discussed hyperemesis concerns w/ cannabis use, she frequently takes gummies.  Will work to control Glc and nausea 05/19: nausea improved, still present  but meds now seem to be working better. Some confusion on home seroquel  regimen but she'd like to come off this, she says she takes it 200 mg at bedtime not 400. At any rate, ok for outpatient follow up. 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 Pt and I discussed hyperemesis concerns w/ cannabis use, she frequently takes gummies.  Will work to control Glc and nausea Scopolamine  patch  Reglan  Outpatient followup   Hyperthyroidism Associated w/ tachycardia Question possible medication absorption difficulty due to her experiencing frequent nausea and vomiting Patient is not in thyroid  storm at this time Burch Wartofsky scale = 10 Home propranolol  80 mg p.o. 3 times daily methimazole  20 mg p.o. twice daily  Outpatient followup    T2DM (type 2 diabetes mellitus) Insulin  SSI with at bedtime coverage ordered Outpatient followup    Hypokalemia Magnesium  level was within normal limits Replace as needed Monitor BMP outpatient    Tarry stool Diarrhea Query Pepto-Bismol use in setting of stomach upset and diarrhea Patient appears to have baseline hemoglobin consistent with female on her menstrual cycle. Question opiate withdrawal diarrhea  Continue to monitor Low clinical suspicion for GI bleed at this time  Outpatient followup    Musculoskeletal pain Abdominal pain, chronic pt continues to be nauseous w/ abdominal  pain she attributes to her chronic neuropathy. She presents screen shots of medications but pharmacy cannot confirm any controlled substance dispenses, pt does not have pill bottles with her, she cannot name the pharmacy she states delivers these medications. Outpatient followup    Severe recurrent major depression without psychotic features  She reports use of Seroquel  400 mg at bedtime and 50 mg bid prn, requests restart this medication, pharmacy states no fills on this rx >6 months. Psychiatry service has been consulted by EDP, appreciate their recommendations Taper off seroquel   Outpatient followup   Hx mastitis / "boil" on L breast Significant tenderness on exam Bactrim  Outpatient followup     Class 1 obesity based on BMI: Body mass index is 30.46 kg/m.Nancy [name] Significantly low or high BMI is associated with higher medical risk.  Underweight - under 18  overweight - 25 to 29 obese - 30 or more Class 1 obesity: BMI of 30.0 to 34 Class 2 obesity: BMI of 35.0 to 39 Class 3 obesity: BMI of 40.0 to 49 Super Morbid Obesity: BMI 50-59 Super-super Morbid Obesity: BMI 60+ Healthy nutrition and physical activity advised as adjunct to other disease management and risk reduction treatments    DVT prophylaxis: heparin  IV fluids: no continuous IV fluids  Nutrition: regular Central lines / other devices: none  Code Status: FULL CODE ACP documentation reviewed: none on file in [facility]  TOC needs: TBD, expect no needs Medical barriers to dispo: po intake. Expected medical readiness for discharge tomorrow.

## 2023-05-23 NOTE — ED Provider Notes (Signed)
 [facility] Provider Note   Event Date/Time   First MD Initiated Contact with Patient 05/23/23 1034     (approximate)  History   Seizures  HPI  Nancy [name] is a 23 y.o. female with a complicated history of hypothyroidism, conversion disorder, nonepileptiform seizures, and diabetes  Reviewed prior discharge summary.  "Patient presents with concern for repeat seizure. 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 Patient did relate to neurology a report of blurry vision in the right eye - referral to optho was advised and that order has been placed. Recent hospitalization for thyroid  storm but no signs of that here - advised compliance w/ home methimazole  and close f/u with endocrinology."  Additionally reviewed ICU notes that it is noted that the patient has had multiple EEGs, none of which have ever shown obvious seizure activity but are been attributed to pseudoseizure.      Physical Exam   Triage Vital Signs: ED Triage Vitals  Encounter Vitals Group     BP 05/23/23 1037 136/78     Systolic BP Percentile --      Diastolic BP Percentile --      Pulse --      Resp 05/23/23 1037 18     Temp 05/23/23 1036 99 F (37.2 C)     Temp Source 05/23/23 1036 Oral     SpO2 05/23/23 1036 100 %     Weight 05/23/23 1038 189 lb 9.5 oz (86 kg)     Height 05/23/23 1038 5\' 3"  (1.6 m)     Head Circumference --      Peak Flow --      Pain Score --      Pain Loc --      Pain Education --      Exclude from Growth Chart --     Most recent vital signs: Vitals:   05/23/23 1300 05/23/23 1338  BP: (!) 145/87 (!) 132/53  Pulse: (!) 101 (!) 111  Resp: (!) 23   Temp:    SpO2: 100%      General: Awake, no distress.  Has a poor memory at this time CV:  Good peripheral perfusion.  Slight tachycardia Resp:  Normal effort.  Clear bilateral with normal  effort Abd:  No distention.  Other:  Alert follows all commands.  Pleasant.  Seems slightly disoriented reports she cannot remember her name but is able to tell me the name of her Lamictal  Trace edema in the lower extremities bilateral  ED Results / Procedures / Treatments   Labs (all labs ordered are listed, but only abnormal results are displayed) Labs Reviewed  BASIC METABOLIC PANEL WITH GFR - Abnormal; Notable for the following components:      Result Value   Sodium 134 (*)    Potassium 2.9 (*)    Glucose, Bld 272 (*)    Creatinine, Ser 0.40 (*)    All other components within normal limits  TSH - Abnormal; Notable for the following components:   TSH <0.010 (*)    All other components within normal limits  T4, FREE - Abnormal; Notable for the following components:   Free T4 3.98 (*)    All other components within normal limits  CBC - Abnormal; Notable for the following components:   Hemoglobin 11.8 (*)    HCT 35.6 (*)    MCV 73.7 (*)  MCH 24.4 (*)    All other components within normal limits  CBG MONITORING, ED - Abnormal; Notable for the following components:   Glucose-Capillary 244 (*)    All other components within normal limits  HCG, QUANTITATIVE, PREGNANCY  MAGNESIUM      EKG  And inter by me at 2100 heart rate 120 QRS 100 QTc 500 Sinus tachycardia mild repolarization like abnormality similar morphology to previous ECG.   RADIOLOGY  CT Head Wo Contrast Result Date: 05/23/2023 CLINICAL DATA:  Provided history: Seizure, new onset, no history of trauma. Neck trauma. EXAM: CT HEAD WITHOUT CONTRAST CT CERVICAL SPINE WITHOUT CONTRAST TECHNIQUE: Multidetector CT imaging of the head and cervical spine was performed following the standard protocol without intravenous contrast. Multiplanar CT image reconstructions of the cervical spine were also generated. RADIATION DOSE REDUCTION: This exam was performed according to the departmental dose-optimization program which  includes automated exposure control, adjustment of the mA and/or kV according to patient size and/or use of iterative reconstruction technique. COMPARISON:  Head CT 05/03/2023. Cervical spine CT 05/03/2023. Thyroid  ultrasound 08/21/2021. FINDINGS: CT HEAD FINDINGS Brain: No age-advanced or lobar predominant cerebral atrophy. There is no acute intracranial hemorrhage. No demarcated cortical infarct. No extra-axial fluid collection. No evidence of an intracranial mass. No midline shift. Vascular: No hyperdense vessel. Skull: No calvarial fracture or aggressive osseous lesion. Sinuses/Orbits: No mass or acute finding within the imaged orbits. No significant paranasal sinus disease at the imaged levels. CT CERVICAL SPINE FINDINGS Alignment: Nonspecific reversal of the expected cervical lordosis. No significant spondylolisthesis. Skull base and vertebrae: The basion-dental and atlanto-dental intervals are maintained.No evidence of acute fracture to the cervical spine. Soft tissues and spinal canal: No prevertebral fluid or swelling. No visible canal hematoma. Disc levels: No appreciable spinal canal stenosis. No significant bony neural foraminal narrowing. Upper chest: No consolidation within the imaged lung apices. No visible pneumothorax. Other: Enlarged and heterogeneous thyroid  gland, as was demonstrated on the prior thyroid  ultrasound of 08/21/2021. Please refer to this prior examination for further description. IMPRESSION: CT head: No evidence of an acute intracranial abnormality. CT cervical spine: 1. No evidence of an acute cervical spine fracture. 2. Nonspecific reversal of the expected cervical lordosis. Electronically Signed   By: Bascom Lily D.O.   On: 05/23/2023 11:20   CT Cervical Spine Wo Contrast Result Date: 05/23/2023 CLINICAL DATA:  Provided history: Seizure, new onset, no history of trauma. Neck trauma. EXAM: CT HEAD WITHOUT CONTRAST CT CERVICAL SPINE WITHOUT CONTRAST TECHNIQUE: Multidetector CT  imaging of the head and cervical spine was performed following the standard protocol without intravenous contrast. Multiplanar CT image reconstructions of the cervical spine were also generated. RADIATION DOSE REDUCTION: This exam was performed according to the departmental dose-optimization program which includes automated exposure control, adjustment of the mA and/or kV according to patient size and/or use of iterative reconstruction technique. COMPARISON:  Head CT 05/03/2023. Cervical spine CT 05/03/2023. Thyroid  ultrasound 08/21/2021. FINDINGS: CT HEAD FINDINGS Brain: No age-advanced or lobar predominant cerebral atrophy. There is no acute intracranial hemorrhage. No demarcated cortical infarct. No extra-axial fluid collection. No evidence of an intracranial mass. No midline shift. Vascular: No hyperdense vessel. Skull: No calvarial fracture or aggressive osseous lesion. Sinuses/Orbits: No mass or acute finding within the imaged orbits. No significant paranasal sinus disease at the imaged levels. CT CERVICAL SPINE FINDINGS Alignment: Nonspecific reversal of the expected cervical lordosis. No significant spondylolisthesis. Skull base and vertebrae: The basion-dental and atlanto-dental intervals are maintained.No evidence of acute fracture  to the cervical spine. Soft tissues and spinal canal: No prevertebral fluid or swelling. No visible canal hematoma. Disc levels: No appreciable spinal canal stenosis. No significant bony neural foraminal narrowing. Upper chest: No consolidation within the imaged lung apices. No visible pneumothorax. Other: Enlarged and heterogeneous thyroid  gland, as was demonstrated on the prior thyroid  ultrasound of 08/21/2021. Please refer to this prior examination for further description. IMPRESSION: CT head: No evidence of an acute intracranial abnormality. CT cervical spine: 1. No evidence of an acute cervical spine fracture. 2. Nonspecific reversal of the expected cervical lordosis.  Electronically Signed   By: Bascom Lily D.O.   On: 05/23/2023 11:20      PROCEDURES:  Critical Care performed: No  Procedures   MEDICATIONS ORDERED IN ED: Medications  propranolol  (INDERAL ) tablet 80 mg (80 mg Oral Given 05/23/23 1339)  methimazole  (TAPAZOLE ) tablet 20 mg (has no administration in time range)  acetaminophen  (TYLENOL ) tablet 650 mg (has no administration in time range)    Or  acetaminophen  (TYLENOL ) suppository 650 mg (has no administration in time range)  ondansetron  (ZOFRAN ) tablet 4 mg (4 mg Oral Given 05/23/23 1339)    Or  ondansetron  (ZOFRAN ) injection 4 mg ( Intravenous See Alternative 05/23/23 1339)  heparin  injection 5,000 Units (has no administration in time range)  lidocaine  (LIDODERM ) 5 % 3 patch (has no administration in time range)  morphine  (PF) 4 MG/ML injection 4 mg (has no administration in time range)  fentaNYL  (SUBLIMAZE ) injection 25 mcg (25 mcg Intravenous Given 05/23/23 1338)  insulin  aspart (novoLOG ) injection 0-15 Units (has no administration in time range)  insulin  aspart (novoLOG ) injection 0-5 Units (has no administration in time range)  LORazepam  (ATIVAN ) injection 1 mg (has no administration in time range)  LORazepam  (ATIVAN ) injection 1 mg (1 mg Intravenous Given 05/23/23 1051)  potassium chloride  SA (KLOR-CON  M) CR tablet 40 mEq (40 mEq Oral Given 05/23/23 1218)  morphine  (PF) 4 MG/ML injection 4 mg (4 mg Intravenous Given 05/23/23 1239)  metoprolol  tartrate (LOPRESSOR ) injection 5 mg (5 mg Intravenous Given 05/23/23 1240)  methimazole  (TAPAZOLE ) tablet 20 mg (20 mg Oral Given 05/23/23 1239)     IMPRESSION / MDM / ASSESSMENT AND PLAN / ED COURSE  I reviewed the triage vital signs and the nursing notes.                              Differential diagnosis includes, but is not limited to, possible stress reaction, medication noncompliance, pseudoseizures, less likely felt to be actual seizure-like activity given the previous fairly  extensive workups not demonstrating epileptic like abnormalities, recurrent hypothyroidism, electrolyte abnormality, closed head injury given report of multiple "seizures" etc.    Patient's presentation is most consistent with acute complicated illness / injury requiring diagnostic workup.   The patient is on the cardiac monitor to evaluate for evidence of arrhythmia and/or significant heart rate changes.       multiple normal / negative EEGs since 2020. No clear evidence of true epilepsy based on review of prior work-ups. Eight EEGs since 2019 negative for seizures.   ----------------------------------------- 12:29 PM on 05/23/2023 ----------------------------------------- Patient is now awake alert fully oriented.  Reports poor compliance with her medications, occasionally missing doses of her medications including methimazole  and blood pressure medicines.  She is calm and at this time exhibits no acute psychomotor agitation or further seizure-like activity.  She is however persistently tachycardic with a heart rate about  120-130 and hypertensive blood pressure 150/90.  I am suspicious based on her labs that her thyroid  levels are rising and that she is at risk of redeveloping thyroid  storm given the medication noncompliance and vital sign abnormalities.  Will restart beta-blocker, methimazole , discussed with the patient and she is agreeable to a voluntary psychiatry consult which I think may be helpful, and also amenable to admission.  I believe she would benefit from treatment for risk of thyroid  storm     With holding QT prolonging medications mild prolongation of QT interval, replating potassium.   ----------------------------------------- 1:46 PM on 05/23/2023 ----------------------------------------- Consulted with and patient accepted to hospitalist by Dr. Reinhold Carbine.  Patient understand agree with plan for admission  FINAL CLINICAL IMPRESSION(S) / ED DIAGNOSES   Final diagnoses:   High thyroid  hormone level  Seizure-like activity (HCC)     Rx / DC Orders   ED Discharge Orders     None        Note:  This document was prepared using Dragon voice recognition software and may include unintentional dictation errors.   Iver Marker, MD 05/23/23 [phone]

## 2023-05-23 NOTE — Assessment & Plan Note (Addendum)
 Magnesium  level was within normal limits Status post potassium chloride  40 mE p.o. one-time dose per EDP Polycitra 20 mEq p.o. one-time dose ordered on admission Recheck BMP in a.m.q

## 2023-05-23 NOTE — Assessment & Plan Note (Signed)
 Per patient, she banged herself by laundry machine during her seizures Lidocaine  patches ordered; morphine  4 mg IV q4h prn for moderate pain; fentanyl  25 mcg q4h prn for severe pain, 20 hours of coverage ordered
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
## 2023-05-23 NOTE — ED Notes (Signed)
 Pt taken to CT.

## 2023-05-23 NOTE — Plan of Care (Signed)
  Problem: Coping: Goal: Ability to adjust to condition or change in health will improve Outcome: Progressing   Problem: Fluid Volume: Goal: Ability to maintain a balanced intake and output will improve Outcome: Progressing   Problem: Health Behavior/Discharge Planning: Goal: Ability to identify and utilize available resources and services will improve Outcome: Progressing Goal: Ability to manage health-related needs will improve Outcome: Progressing   Problem: Metabolic: Goal: Ability to maintain appropriate glucose levels will improve Outcome: Progressing   Problem: Nutritional: Goal: Maintenance of adequate nutrition will improve Outcome: Progressing Goal: Progress toward achieving an optimal weight will improve Outcome: Progressing   Problem: Skin Integrity: Goal: Risk for impaired skin integrity will decrease Outcome: Progressing   Problem: Tissue Perfusion: Goal: Adequacy of tissue perfusion will improve Outcome: Progressing   Problem: Education: Goal: Knowledge of General Education information will improve Description: Including pain rating scale, medication(s)/side effects and non-pharmacologic comfort measures Outcome: Progressing   Problem: Health Behavior/Discharge Planning: Goal: Ability to manage health-related needs will improve Outcome: Progressing   Problem: Clinical Measurements: Goal: Ability to maintain clinical measurements within normal limits will improve Outcome: Progressing Goal: Will remain free from infection Outcome: Progressing Goal: Diagnostic test results will improve Outcome: Progressing Goal: Respiratory complications will improve Outcome: Progressing   Problem: Coping: Goal: Level of anxiety will decrease Outcome: Progressing   Problem: Elimination: Goal: Will not experience complications related to bowel motility Outcome: Progressing Goal: Will not experience complications related to urinary retention Outcome: Progressing    Problem: Pain Managment: Goal: General experience of comfort will improve and/or be controlled Outcome: Progressing   Problem: Safety: Goal: Ability to remain free from injury will improve Outcome: Progressing   Problem: Skin Integrity: Goal: Risk for impaired skin integrity will decrease Outcome: Progressing

## 2023-05-23 NOTE — Assessment & Plan Note (Addendum)
 With medication retention difficulty due to her experiencing frequent nausea and vomiting Patient is not in thyroid  storm at this time Burch Wartofsky scale = 10 Home propranolol  80 mg p.o. 3 times daily, methimazole  20 mg p.o. twice daily were resumed on admission

## 2023-05-23 NOTE — ED Notes (Signed)
 Pt calling out stating the morphine  is not helping with pain, will inform Dr Valetta Gaudy.

## 2023-05-23 NOTE — Assessment & Plan Note (Signed)
 Behavior health has been consulted by EDP

## 2023-05-23 NOTE — Progress Notes (Signed)
 Patient transferred to room 126 via bed. Patients belongings transported with patient. Call light placed within reach of patient and patient instructed to call before getting out of bed.

## 2023-05-23 NOTE — ED Notes (Signed)
 Pt up to use restroom, no assistance required, pt steady gait.

## 2023-05-23 NOTE — Assessment & Plan Note (Signed)
 Suspect pseudoseizures Ativan  1 mg IV as needed for seizure, 3 doses ordered with instruction to administer as appropriate and let provider know Seizure, aspiration precaution, fall precautions

## 2023-05-24 DIAGNOSIS — R569 Unspecified convulsions: Secondary | ICD-10-CM | POA: Diagnosis not present

## 2023-05-24 LAB — BASIC METABOLIC PANEL WITH GFR
Anion gap: 10 (ref 5–15)
BUN: 13 mg/dL (ref 6–20)
CO2: 24 mmol/L (ref 22–32)
Calcium: 9.2 mg/dL (ref 8.9–10.3)
Chloride: 101 mmol/L (ref 98–111)
Creatinine, Ser: 0.41 mg/dL — ABNORMAL LOW (ref 0.44–1.00)
GFR, Estimated: >60 mL/min (ref >60)
Glucose, Bld: 163 mg/dL — ABNORMAL HIGH (ref 70–99)
Potassium: 3.4 mmol/L — ABNORMAL LOW (ref 3.5–5.1)
Sodium: 135 mmol/L (ref 135–145)

## 2023-05-24 LAB — CBC
HCT: 36 % (ref 36.0–46.0)
Hemoglobin: 11.8 g/dL — ABNORMAL LOW (ref 12.0–15.0)
MCH: 24.7 pg — ABNORMAL LOW (ref 26.0–34.0)
MCHC: 32.8 g/dL (ref 30.0–36.0)
MCV: 75.3 fL — ABNORMAL LOW (ref 80.0–100.0)
Platelets: 208 10*3/uL (ref 150–400)
RBC: 4.78 MIL/uL (ref 3.87–5.11)
RDW: 14.6 % (ref 11.5–15.5)
WBC: 8.8 10*3/uL (ref 4.0–10.5)
nRBC: 0 % (ref 0.0–0.2)

## 2023-05-24 LAB — GLUCOSE, CAPILLARY
Glucose-Capillary: 187 mg/dL — ABNORMAL HIGH (ref 70–99)
Glucose-Capillary: 182 mg/dL — ABNORMAL HIGH (ref 70–99)
Glucose-Capillary: 173 mg/dL — ABNORMAL HIGH (ref 70–99)
Glucose-Capillary: 227 mg/dL — ABNORMAL HIGH (ref 70–99)

## 2023-05-24 MED ORDER — METOCLOPRAMIDE HCL 10 MG PO TABS
10.0000 mg | ORAL_TABLET | Freq: Three times a day (TID) | ORAL | Status: DC
  Administered 2023-05-24 – 2023-05-25 (×4): 10 mg via ORAL
  Filled 2023-05-24 (×5): qty 1

## 2023-05-24 MED ORDER — HYDROCODONE-ACETAMINOPHEN 5-325 MG PO TABS
1.0000 | ORAL_TABLET | Freq: Four times a day (QID) | ORAL | Status: DC | PRN
  Administered 2023-05-24: 1 via ORAL
  Filled 2023-05-24: qty 1

## 2023-05-24 MED ORDER — HYDROCODONE-ACETAMINOPHEN 5-325 MG PO TABS
1.0000 | ORAL_TABLET | Freq: Four times a day (QID) | ORAL | Status: DC | PRN
  Administered 2023-05-24: 2 via ORAL
  Administered 2023-05-24: 1 via ORAL
  Administered 2023-05-25 (×3): 2 via ORAL
  Filled 2023-05-24 (×4): qty 2
  Filled 2023-05-24: qty 1

## 2023-05-24 MED ORDER — SCOPOLAMINE 1 MG/3DAYS TD PT72
1.0000 | MEDICATED_PATCH | TRANSDERMAL | Status: DC
  Administered 2023-05-24: 1.5 mg via TRANSDERMAL
  Filled 2023-05-24: qty 1

## 2023-05-24 MED ORDER — QUETIAPINE FUMARATE 25 MG PO TABS
50.0000 mg | ORAL_TABLET | Freq: Two times a day (BID) | ORAL | Status: DC | PRN
  Administered 2023-05-24: 50 mg via ORAL
  Filled 2023-05-24: qty 2

## 2023-05-24 MED ORDER — LOPERAMIDE HCL 2 MG PO CAPS
4.0000 mg | ORAL_CAPSULE | ORAL | Status: DC | PRN
  Administered 2023-05-24 – 2023-05-25 (×2): 4 mg via ORAL
  Filled 2023-05-24 (×2): qty 2

## 2023-05-24 MED ORDER — SULFAMETHOXAZOLE-TRIMETHOPRIM 800-160 MG PO TABS
1.0000 | ORAL_TABLET | Freq: Two times a day (BID) | ORAL | Status: DC
  Administered 2023-05-24 – 2023-05-25 (×3): 1 via ORAL
  Filled 2023-05-24 (×3): qty 1

## 2023-05-24 MED ORDER — QUETIAPINE FUMARATE 25 MG PO TABS
200.0000 mg | ORAL_TABLET | Freq: Every day | ORAL | Status: DC
  Administered 2023-05-24: 200 mg via ORAL
  Filled 2023-05-24: qty 8

## 2023-05-24 MED ORDER — QUETIAPINE FUMARATE 25 MG PO TABS: 50.0000 mg | ORAL_TABLET | Freq: Two times a day (BID) | ORAL | Status: DC

## 2023-05-24 MED ORDER — QUETIAPINE FUMARATE 200 MG PO TABS: 400.0000 mg | ORAL_TABLET | Freq: Every day | ORAL | Status: DC

## 2023-05-24 NOTE — Progress Notes (Signed)
 PROGRESS NOTE    Nancy [name]   WJX:[id number] DOB: January 31, 2000  DOA: 05/23/2023 Date of Service: 05/24/23 which is hospital day 1  PCP: Zoila Hines, FNP    Hospital course / significant events:   HPI: Nancy [name] is a 23 year old female with history of hyperthyroid, insulin -dependent diabetes mellitus, pseudoseizure, presents emergency department for chief concerns of seizure - reports compliance w/ medications but frequent nausea/vomiting and she attributes possible malabsorption to this, reports diarrhea onset 2 days prior w/ black stool.  Records reviewed: Recently d/c from [facility] hospital 05/04/23 w/ seizures evaluated by neurology as nonepileptic, normal brain imaging at that time. Of note, on previous admission right before that (04/24-04/26) pt requested opioids and stated she was prescribed them at home, she even presented what appeared to be screenshot from [facility] documenting these Rx however after extensive searching by [facility] pharmacy team, it was concluded that screenshot was factitious and no recent opioid or other narcotic prescriptions were identified - when patient was asked about this she left AMA.   05/17: to ED, sinus tachy, other VSS. Was given Methimazole  20 mg p.o. one-time dose, metoprolol  5 mg IV one-time dose, morphine  4 mg IV one-time dose, potassium chloride  40 miEq p.o., Ativan  1 mg IV one-time dose. Admitted to hospitalist service  05/18: pt continues to be nauseous w/ abdominal pain she attributes to her chronic neuropathy. She presents screen shots of medications but pharmacy cannot confirm any controlled substance dispenses, pt does not have pill bottles wit her, she cannot name the pharmacy she states delivers these medications. I advised I will not keep her on IV medications or continue high dose opiates I cannot confirm. She also reports use of Seroquel  400 mg at bedtime and 50 mg bid prn, requests restart this medication, pharmacy states no fills on this rx >6  months. Pt and I discussed hyperemesis concerns w/ cannabis use, she frequently takes gummies.  Will work to control Glc and nausea     Consultants:  Psychiatry   Procedures/Surgeries: none      ASSESSMENT & PLAN:   Seizure-like activity  Psychogenic nonepileptic seizure Suspect pseudoseizures/nonepileptic Ativan  1 mg IV as needed for seizure, 3 doses ordered with instruction to administer as appropriate and let provider know Seizure, aspiration precaution, fall precautions Previous extensive workup, will not repeat unless change in character of seizure like activity or other concerns.   Hyperemesis Chronic nausea Suspect gastroparesis related to IDDM, cannabinoid hyperemesis, possibly opiate withdrawal Discussion on opiates as below - I advised I will not keep her on IV medications or continue high dose opiates I cannot confirm. Pt and I discussed hyperemesis concerns w/ cannabis use, she frequently takes gummies.  Will work to control Glc and nausea Scopolamine  patch  Reglan  If we can control nausea / tolerate po meds will anticipate discharge, pt reports understanding of this plan    Hyperthyroidism Associated w/ tachycardia Question possible medication absorption difficulty due to her experiencing frequent nausea and vomiting Patient is not in thyroid  storm at this time Burch Wartofsky scale = 10 Home propranolol  80 mg p.o. 3 times daily methimazole  20 mg p.o. twice daily    T2DM (type 2 diabetes mellitus) Insulin  SSI with at bedtime coverage ordered   Hypokalemia Magnesium  level was within normal limits Replace as needed Monitor BMP   Tarry stool Query Pepto-Bismol use in setting of stomach upset and diarrhea Patient appears to have baseline hemoglobin consistent with female on her menstrual cycle Continue to monitor Low clinical  suspicion for GI bleed at this time If patient continues to have tarry stool in the hospital, would recommend a.m. team to consider  consultation to GI Repeat CBC in the a.m. --> stable    Musculoskeletal pain Abdominal pain, chronic pt continues to be nauseous w/ abdominal pain she attributes to her chronic neuropathy. She presents screen shots of medications but pharmacy cannot confirm any controlled substance dispenses, pt does not have pill bottles with her, she cannot name the pharmacy she states delivers these medications.Lidocaine  patches ordered for back pain, heat therapy as desired  dc IV pain medications in absence of fracture/surgery  Po Norco prn, I will not escalate opiates further    Severe recurrent major depression without psychotic features  She reports use of Seroquel  400 mg at bedtime and 50 mg bid prn, requests restart this medication, pharmacy states no fills on this rx >6 months. Psychiatry service has been consulted by EDP, appreciate their recommendations Continue seroquel      Class 1 obesity based on BMI: Body mass index is 30.46 kg/m.[name] Significantly low or high BMI is associated with higher medical risk.  Underweight - under 18  overweight - 25 to 29 obese - 30 or more Class 1 obesity: BMI of 30.0 to 34 Class 2 obesity: BMI of 35.0 to 39 Class 3 obesity: BMI of 40.0 to 49 Super Morbid Obesity: BMI 50-59 Super-super Morbid Obesity: BMI 60+ Healthy nutrition and physical activity advised as adjunct to other disease management and risk reduction treatments    DVT prophylaxis: heparin  IV fluids: no continuous IV fluids  Nutrition: regular Central lines / other devices: none  Code Status: FULL CODE ACP documentation reviewed: none on file in [facility]  TOC needs: TBD, expect no needs Medical barriers to dispo: po intake. Expected medical readiness for discharge tomorrow.              Subjective / Brief ROS:  Patient reports nausea, abd pain, breast pain Denies CP/SOB.  Denies new weakness..  Reports no concerns w/ urination/defecation.   Family Communication: none at this  time    Objective Findings:  Vitals:   05/23/23 2218 05/24/23 0511 05/24/23 0814 05/24/23 1017  BP: (!) 107/58 122/67 124/77 133/73  Pulse: 99 93 93 89  Resp: 18     Temp: 98.2 F (36.8 C) 98 F (36.7 C) (!) 97.4 F (36.3 C)   TempSrc:      SpO2: 100% 95% 99%   Weight:      Height:        Intake/Output Summary (Last 24 hours) at 05/24/2023 1519 Last data filed at 05/24/2023 0900 Gross per 24 hour  Intake 0 ml  Output --  Net 0 ml   Filed Weights   05/23/23 1038 05/23/23 1115 05/23/23 1406  Weight: 86 kg 81.6 kg 78 kg    Examination:  Physical Exam Constitutional:      General: She is not in acute distress.    Appearance: She is not ill-appearing.  Cardiovascular:     Rate and Rhythm: Normal rate and regular rhythm.  Pulmonary:     Effort: Pulmonary effort is normal.     Breath sounds: Normal breath sounds.  Chest:     Comments: Pt reports extreme tenderness to minimal pressure on breast exam in area just medial and just inferior to L nipple. NO erythema, no drainage, no mass/fluctuance palpated Abdominal:     General: Bowel sounds are normal.     Palpations: Abdomen is soft.  Musculoskeletal:     Right lower leg: No edema.     Left lower leg: No edema.  Skin:    General: Skin is warm and dry.  Neurological:     General: No focal deficit present.     Mental Status: She is alert and oriented to person, place, and time.          Scheduled Medications:   Chlorhexidine  Gluconate Cloth  6 each Topical Q0600   heparin   5,000 Units Subcutaneous Q8H   insulin  aspart  0-15 Units Subcutaneous TID WC   insulin  aspart  0-5 Units Subcutaneous QHS   methimazole   20 mg Oral BID   metoCLOPramide   10 mg Oral TID AC & HS   propranolol   80 mg Oral TID   QUEtiapine   200 mg Oral QHS   scopolamine   1 patch Transdermal Q72H   sulfamethoxazole -trimethoprim   1 tablet Oral Q12H    Continuous Infusions:   PRN Medications:  acetaminophen  **OR** acetaminophen ,  HYDROcodone -acetaminophen , lidocaine , LORazepam , ondansetron  **OR** ondansetron  (ZOFRAN ) IV, QUEtiapine   Antimicrobials from admission:  Anti-infectives (From admission, onward)    Start     Dose/Rate Route Frequency Ordered Stop   05/24/23 1145  sulfamethoxazole -trimethoprim  (BACTRIM  DS) 800-160 MG per tablet 1 tablet        1 tablet Oral Every 12 hours 05/24/23 1046             Data Reviewed:  I have personally reviewed the following...  CBC: Recent Labs  Lab 05/23/23 1041 05/24/23 0447  WBC 8.7 8.8  HGB 11.8* 11.8*  HCT 35.6* 36.0  MCV 73.7* 75.3*  PLT 211 208   Basic Metabolic Panel: Recent Labs  Lab 05/23/23 1041 05/24/23 0447  NA 134* 135  K 2.9* 3.4*  CL 100 101  CO2 22 24  GLUCOSE 272* 163*  BUN 9 13  CREATININE 0.40* 0.41*  CALCIUM  9.3 9.2  MG 1.8  --    GFR: Estimated Creatinine Clearance: 107.2 mL/min (A) (by C-G formula based on SCr of 0.41 mg/dL (L)). Liver Function Tests: No results for input(s): "AST", "ALT", "ALKPHOS", "BILITOT", "PROT", "ALBUMIN" in the last 168 hours. No results for input(s): "LIPASE", "AMYLASE" in the last 168 hours. No results for input(s): "AMMONIA" in the last 168 hours. Coagulation Profile: No results for input(s): "INR", "PROTIME" in the last 168 hours. Cardiac Enzymes: No results for input(s): "CKTOTAL", "CKMB", "CKMBINDEX", "TROPONINI" in the last 168 hours. BNP (last 3 results) No results for input(s): "PROBNP" in the last 8760 hours. HbA1C: No results for input(s): "HGBA1C" in the last 72 hours. CBG: Recent Labs  Lab 05/23/23 1407 05/23/23 1821 05/23/23 2115 05/24/23 1001 05/24/23 1226  GLUCAP 226* 250* 118* 187* 182*   Lipid Profile: No results for input(s): "CHOL", "HDL", "LDLCALC", "TRIG", "CHOLHDL", "LDLDIRECT" in the last 72 hours. Thyroid  Function Tests: Recent Labs    05/23/23 1041  TSH <0.010*  FREET4 3.98*   Anemia Panel: No results for input(s): "VITAMINB12", "FOLATE", "FERRITIN",  "TIBC", "IRON", "RETICCTPCT" in the last 72 hours. Most Recent Urinalysis On File:     Component Value Date/Time   COLORURINE YELLOW (A) 05/01/2023 1535   APPEARANCEUR CLEAR (A) 05/01/2023 1535   LABSPEC 1.029 05/01/2023 1535   PHURINE 5.0 05/01/2023 1535   GLUCOSEU 150 (A) 05/01/2023 1535   HGBUR NEGATIVE 05/01/2023 1535   BILIRUBINUR NEGATIVE 05/01/2023 1535   BILIRUBINUR negative 07/13/2018 1651   KETONESUR 20 (A) 05/01/2023 1535   PROTEINUR NEGATIVE 05/01/2023 1535   UROBILINOGEN 0.2  07/13/2018 1651   UROBILINOGEN 1.0 07/09/2017 1324   NITRITE NEGATIVE 05/01/2023 1535   LEUKOCYTESUR NEGATIVE 05/01/2023 1535   Sepsis Labs: @LABRCNTIP (procalcitonin:4,lacticidven:4) Microbiology: Recent Results (from the past 240 hours)  MRSA Next Gen by PCR, Nasal     Status: None   Collection Time: 05/23/23  2:10 PM   Specimen: Nasal Mucosa; Nasal Swab  Result Value Ref Range Status   MRSA by PCR Next Gen NOT DETECTED NOT DETECTED Final    Comment: (NOTE) The GeneXpert MRSA Assay (FDA approved for NASAL specimens only), is one component of a comprehensive MRSA colonization surveillance program. It is not intended to diagnose MRSA infection nor to guide or monitor treatment for MRSA infections. Test performance is not FDA approved in patients less than 21 years old. Performed at [facility], [address], [place], [place] [address]       Radiology Studies last 3 days: [facility] Chest [facility] 1 View Result Date: 05/23/2023 CLINICAL DATA:  Seizure EXAM: PORTABLE CHEST 1 VIEW COMPARISON:  04/30/2023 FINDINGS: Single frontal view of the chest demonstrates a stable cardiac silhouette. No airspace disease, effusion, or pneumothorax. No acute bony abnormalities. IMPRESSION: 1. No acute intrathoracic process. Electronically Signed   By: Bobbye Burrow M.D.   On: 05/23/2023 14:00   CT Head Wo Contrast Result Date: 05/23/2023 CLINICAL DATA:  Provided history: Seizure, new onset, no history of  trauma. Neck trauma. EXAM: CT HEAD WITHOUT CONTRAST CT CERVICAL SPINE WITHOUT CONTRAST TECHNIQUE: Multidetector CT imaging of the head and cervical spine was performed following the standard protocol without intravenous contrast. Multiplanar CT image reconstructions of the cervical spine were also generated. RADIATION DOSE REDUCTION: This exam was performed according to the departmental dose-optimization program which includes automated exposure control, adjustment of the mA and/or kV according to patient size and/or use of iterative reconstruction technique. COMPARISON:  Head CT 05/03/2023. Cervical spine CT 05/03/2023. Thyroid  ultrasound 08/21/2021. FINDINGS: CT HEAD FINDINGS Brain: No age-advanced or lobar predominant cerebral atrophy. There is no acute intracranial hemorrhage. No demarcated cortical infarct. No extra-axial fluid collection. No evidence of an intracranial mass. No midline shift. Vascular: No hyperdense vessel. Skull: No calvarial fracture or aggressive osseous lesion. Sinuses/Orbits: No mass or acute finding within the imaged orbits. No significant paranasal sinus disease at the imaged levels. CT CERVICAL SPINE FINDINGS Alignment: Nonspecific reversal of the expected cervical lordosis. No significant spondylolisthesis. Skull base and vertebrae: The basion-dental and atlanto-dental intervals are maintained.No evidence of acute fracture to the cervical spine. Soft tissues and spinal canal: No prevertebral fluid or swelling. No visible canal hematoma. Disc levels: No appreciable spinal canal stenosis. No significant bony neural foraminal narrowing. Upper chest: No consolidation within the imaged lung apices. No visible pneumothorax. Other: Enlarged and heterogeneous thyroid  gland, as was demonstrated on the prior thyroid  ultrasound of 08/21/2021. Please refer to this prior examination for further description. IMPRESSION: CT head: No evidence of an acute intracranial abnormality. CT cervical spine:  1. No evidence of an acute cervical spine fracture. 2. Nonspecific reversal of the expected cervical lordosis. Electronically Signed   By: Bascom Lily D.O.   On: 05/23/2023 11:20   CT Cervical Spine Wo Contrast Result Date: 05/23/2023 CLINICAL DATA:  Provided history: Seizure, new onset, no history of trauma. Neck trauma. EXAM: CT HEAD WITHOUT CONTRAST CT CERVICAL SPINE WITHOUT CONTRAST TECHNIQUE: Multidetector CT imaging of the head and cervical spine was performed following the standard protocol without intravenous contrast. Multiplanar CT image reconstructions of the cervical spine were  also generated. RADIATION DOSE REDUCTION: This exam was performed according to the departmental dose-optimization program which includes automated exposure control, adjustment of the mA and/or kV according to patient size and/or use of iterative reconstruction technique. COMPARISON:  Head CT 05/03/2023. Cervical spine CT 05/03/2023. Thyroid  ultrasound 08/21/2021. FINDINGS: CT HEAD FINDINGS Brain: No age-advanced or lobar predominant cerebral atrophy. There is no acute intracranial hemorrhage. No demarcated cortical infarct. No extra-axial fluid collection. No evidence of an intracranial mass. No midline shift. Vascular: No hyperdense vessel. Skull: No calvarial fracture or aggressive osseous lesion. Sinuses/Orbits: No mass or acute finding within the imaged orbits. No significant paranasal sinus disease at the imaged levels. CT CERVICAL SPINE FINDINGS Alignment: Nonspecific reversal of the expected cervical lordosis. No significant spondylolisthesis. Skull base and vertebrae: The basion-dental and atlanto-dental intervals are maintained.No evidence of acute fracture to the cervical spine. Soft tissues and spinal canal: No prevertebral fluid or swelling. No visible canal hematoma. Disc levels: No appreciable spinal canal stenosis. No significant bony neural foraminal narrowing. Upper chest: No consolidation within the imaged  lung apices. No visible pneumothorax. Other: Enlarged and heterogeneous thyroid  gland, as was demonstrated on the prior thyroid  ultrasound of 08/21/2021. Please refer to this prior examination for further description. IMPRESSION: CT head: No evidence of an acute intracranial abnormality. CT cervical spine: 1. No evidence of an acute cervical spine fracture. 2. Nonspecific reversal of the expected cervical lordosis. Electronically Signed   By: Bascom Lily D.O.   On: 05/23/2023 11:20       Time spent: 50 min     Jessen Siegman, DO [facility] 05/24/2023, 3:19 PM    Dictation software may have been used to generate the above note. Typos may occur and escape review in typed/dictated notes. Please contact Dr Authur Leghorn directly for clarity if needed.  Staff may message me via secure chat in Epic  but this may not receive an immediate response,  please page me for urgent matters!  If 7PM-7AM, please contact night coverage [email or web address]

## 2023-05-24 NOTE — Plan of Care (Signed)

## 2023-05-24 NOTE — Plan of Care (Addendum)
 Neuro: alert, oriented, appropriate responses Cardiac: 80s-90s, NSR Respiratory: room air GI/GU: regular diet (thin liquids), voids, independent Musculoskeletal: independent, moves all extremities Skin: pt reports boil on left breast, MD notified, no superficial boil noted during exam with provider. Pt restarted on abx per request, possible ultrasound for further eval.   Pain: IV Morphine  discontinued. Pt now has hydrocodone  5-325 mg Q6 ordered. K pad heat therapy provided to patient with positive affect.   Nausea: Zofran , Reglan  PO given, Scopolamine  patch applied. Pt tolerating small amounts off food, emesis x 1 in am with breakfast.    Problem: Education: Goal: Ability to describe self-care measures that may prevent or decrease complications (Diabetes Survival Skills Education) will improve Outcome: Progressing Goal: Individualized Educational Video(s) Outcome: Progressing   Problem: Coping: Goal: Ability to adjust to condition or change in health will improve Outcome: Progressing   Problem: Fluid Volume: Goal: Ability to maintain a balanced intake and output will improve Outcome: Progressing   Problem: Health Behavior/Discharge Planning: Goal: Ability to identify and utilize available resources and services will improve Outcome: Progressing Goal: Ability to manage health-related needs will improve Outcome: Progressing   Problem: Metabolic: Goal: Ability to maintain appropriate glucose levels will improve Outcome: Progressing   Problem: Nutritional: Goal: Maintenance of adequate nutrition will improve Outcome: Progressing Goal: Progress toward achieving an optimal weight will improve Outcome: Progressing   Problem: Skin Integrity: Goal: Risk for impaired skin integrity will decrease Outcome: Progressing   Problem: Tissue Perfusion: Goal: Adequacy of tissue perfusion will improve Outcome: Progressing   Problem: Education: Goal: Knowledge of General Education  information will improve Description: Including pain rating scale, medication(s)/side effects and non-pharmacologic comfort measures Outcome: Progressing   Problem: Health Behavior/Discharge Planning: Goal: Ability to manage health-related needs will improve Outcome: Progressing   Problem: Clinical Measurements: Goal: Ability to maintain clinical measurements within normal limits will improve Outcome: Progressing Goal: Will remain free from infection Outcome: Progressing Goal: Diagnostic test results will improve Outcome: Progressing Goal: Respiratory complications will improve Outcome: Progressing Goal: Cardiovascular complication will be avoided Outcome: Progressing   Problem: Activity: Goal: Risk for activity intolerance will decrease Outcome: Progressing   Problem: Nutrition: Goal: Adequate nutrition will be maintained Outcome: Progressing   Problem: Coping: Goal: Level of anxiety will decrease Outcome: Progressing   Problem: Elimination: Goal: Will not experience complications related to bowel motility Outcome: Progressing Goal: Will not experience complications related to urinary retention Outcome: Progressing   Problem: Pain Managment: Goal: General experience of comfort will improve and/or be controlled Outcome: Progressing   Problem: Safety: Goal: Ability to remain free from injury will improve Outcome: Progressing   Problem: Skin Integrity: Goal: Risk for impaired skin integrity will decrease Outcome: Progressing

## 2023-05-24 NOTE — Progress Notes (Signed)
 Neuro: alert, oriented, appropriate responses Cardiac: 80s-90s, NSR Respiratory: room air GI/GU: normal diet, voids, independent Musculoskeletal: independent, moves all extremities Skin: pt reports boil on left breast, MD notified, no superficial boil noted during exam with provider. Pt restarted on abx per request, possible ultrasound for further eval.

## 2023-05-25 DIAGNOSIS — R569 Unspecified convulsions: Secondary | ICD-10-CM | POA: Diagnosis not present

## 2023-05-25 LAB — GLUCOSE, CAPILLARY
Glucose-Capillary: 244 mg/dL — ABNORMAL HIGH (ref 70–99)
Glucose-Capillary: 87 mg/dL (ref 70–99)

## 2023-05-25 MED ORDER — DIPHENOXYLATE-ATROPINE 2.5-0.025 MG PO TABS
1.0000 | ORAL_TABLET | Freq: Four times a day (QID) | ORAL | Status: DC
  Administered 2023-05-25: 1 via ORAL
  Filled 2023-05-25: qty 1

## 2023-05-25 MED ORDER — SULFAMETHOXAZOLE-TRIMETHOPRIM 800-160 MG PO TABS: 1.0000 | ORAL_TABLET | Freq: Two times a day (BID) | ORAL | 0 refills | Status: AC

## 2023-05-25 MED ORDER — QUETIAPINE FUMARATE 100 MG PO TABS: ORAL_TABLET | ORAL | Status: DC

## 2023-05-25 MED ORDER — INSULIN GLARGINE 100 UNITS/ML SOLOSTAR PEN: 8.0000 [IU] | PEN_INJECTOR | Freq: Two times a day (BID) | SUBCUTANEOUS | Status: AC

## 2023-05-25 MED ORDER — SCOPOLAMINE 1 MG/3DAYS TD PT72: 1.0000 | MEDICATED_PATCH | TRANSDERMAL | 0 refills | Status: DC

## 2023-05-25 MED ORDER — PROPRANOLOL HCL 80 MG PO TABS: 80.0000 mg | ORAL_TABLET | Freq: Three times a day (TID) | ORAL | 0 refills | Status: AC

## 2023-05-25 MED ORDER — DIPHENOXYLATE-ATROPINE 2.5-0.025 MG PO TABS: 1.0000 | ORAL_TABLET | Freq: Four times a day (QID) | ORAL | 0 refills | Status: DC | PRN

## 2023-05-25 MED ORDER — METOCLOPRAMIDE HCL 10 MG PO TABS: 10.0000 mg | ORAL_TABLET | Freq: Three times a day (TID) | ORAL | 0 refills | Status: DC

## 2023-05-25 NOTE — Inpatient Diabetes Management (Signed)
 Inpatient Diabetes Program Recommendations  AACE/ADA: New Consensus Statement on Inpatient Glycemic Control (2015)  Target Ranges:  Prepandial:   less than 140 mg/dL      Peak postprandial:   less than 180 mg/dL (1-2 hours)      Critically ill patients:  140 - 180 mg/dL   Lab Results  Component Value Date   GLUCAP 244 (H) 05/25/2023   HGBA1C 5.8 (H) 03/06/2023    Review of Glycemic Control  Latest Reference Range & Units 05/24/23 10:01 05/24/23 12:26 05/24/23 17:38 05/24/23 21:59 05/25/23 08:32  Glucose-Capillary 70 - 99 mg/dL 161 (H) 096 (H) 045 (H) 227 (H) 244 (H)  (H): Data is abnormally high Diabetes history: Type 1 DM Outpatient Diabetes medications: Lantus  8 units BID, Novolog  0-10 units TID Current orders for Inpatient glycemic control: Novolog  0-15 units TID & HS  Inpatient Diabetes Program Recommendations:    Consider adding Semglee  10 units every day  Thanks, Marjo Sievert, MSN, RNC-OB Diabetes Coordinator [phone] (8a-5p)

## 2023-05-25 NOTE — TOC Transition Note (Signed)
 Transition of Care [facility]) - Discharge Note   Patient Details  Name: Nancy [name] MRN: [id number] Date of Birth: Apr 04, 2000  Transition of Care [facility]) CM/SW Contact:  Crayton Docker, RN 05/25/2023, 4:30 PM   Clinical Narrative:     Alert received from RN Cain Castillo regarding patient request for transportation assistance. CM call to patient's phone: [phone] regarding request for taxi voucher. Per patient will return to address on file. CM call to [facility], to request taxi as soon as possible due to discharge pending. CM spoke to [place], taxi voucher completed, $70.00. Copy of taxi voucher provided to patient. CM alert to RN Cain Castillo regarding taxi arrival by 1700.  Final next level of care: Home/Self Care Barriers to Discharge: No Barriers Identified   Patient Goals and CMS Choice    Home/self care   Discharge Placement    Home/self care             Discharge Plan and Services Additional resources added to the After Visit Summary for      Home/self care              Social Drivers of Health (SDOH) Interventions SDOH Screenings   Food Insecurity: No Food Insecurity (05/24/2023)  Housing: Low Risk  (05/24/2023)  Transportation Needs: No Transportation Needs (05/24/2023)  Utilities: Not At Risk (05/24/2023)  Alcohol Screen: Low Risk  (11/06/2018)  Depression (PHQ2-9): Low Risk  (12/06/2018)  Recent Concern: Depression (PHQ2-9) - Medium Risk (10/30/2018)  Financial Resource Strain: Medium Risk (06/13/2022)   Received from [facility]  Social Connections: Unknown (05/24/2023)  Tobacco Use: High Risk (05/23/2023)     Readmission Risk Interventions    07/28/2022   12:56 PM 08/22/2021   10:32 AM  Readmission Risk Prevention Plan  Transportation Screening Complete Complete  Medication Review (RN Care Manager) Complete Complete  PCP or Specialist appointment within 3-5 days of discharge Complete Complete  HRI or Home Care Consult Complete   SW  Recovery Care/Counseling Consult Complete Complete  Palliative Care Screening Not Applicable Not Applicable  Skilled Nursing Facility Not Applicable Not Applicable

## 2023-05-25 NOTE — Discharge Summary (Signed)
 Physician Discharge Summary   Patient: Nancy [name] MRN: [id number]  DOB: July 25, 2000   Admit:     Date of Admission: 05/23/2023 Admitted from: home   Discharge: Date of discharge: 05/25/23 Disposition: Home Condition at discharge: good  CODE STATUS: FULL CODE     Discharge Physician: Nancy Sprung, DO [facility]     PCP: Nancy Hines, FNP  Recommendations for Outpatient Follow-up:  Follow up with PCP Ramji, Amrita, FNP in 1-2 weeks    Discharge Instructions     Diet Carb Modified   Complete by: As directed    Increase activity slowly   Complete by: As directed          Discharge Diagnoses: Principal Problem:   Seizure-like activity (HCC) Active Problems:   Tachycardia   Hyperthyroidism   T2DM (type 2 diabetes mellitus) (HCC)   History of psychogenic nonepileptic seizure   Hypokalemia   Adjustment disorder   MDD (major depressive disorder), recurrent severe, without psychosis (HCC)   Severe recurrent major depression without psychotic features (HCC)   Psychogenic nonepileptic seizure   Diabetic gastroparesis (HCC)   Polysubstance abuse (HCC)   Gastritis   Musculoskeletal back pain   Tarry stool      Hospital course / significant events:   HPI: Nancy [name] is a 23 year old female with history of hyperthyroid, insulin -dependent diabetes mellitus, pseudoseizure, presents emergency department for chief concerns of seizure - reports compliance w/ medications but frequent nausea/vomiting and she attributes possible malabsorption to this, reports diarrhea onset 2 days prior w/ black stool.  Records reviewed: Recently d/c from [facility] 05/04/23 w/ seizures evaluated by neurology as nonepileptic, normal brain imaging at that time. Of note, on previous admission right before that (04/24-04/26) pt requested opioids and stated she was prescribed them at home, she even presented what appeared to be screenshot from [facility] documenting these  Rx however after extensive searching by [facility] pharmacy team, it was concluded that screenshot was factitious and no recent opioid or other narcotic prescriptions were identified - when patient was asked about this she left AMA.   05/17: to ED, sinus tachy, other VSS. Was given Methimazole  20 mg p.o. one-time dose, metoprolol  5 mg IV one-time dose, morphine  4 mg IV one-time dose, potassium chloride  40 miEq p.o., Ativan  1 mg IV one-time dose. Admitted to hospitalist service  05/18: pt continues to be nauseous w/ abdominal pain she attributes to her chronic neuropathy. She presents screen shots of medications but pharmacy cannot confirm any controlled substance dispenses, pt does not have pill bottles wit her, she cannot name the pharmacy she states delivers these medications. I advised I will not keep her on IV medications or continue high dose opiates I cannot confirm. She also reports use of Seroquel  400 mg at bedtime and 50 mg bid prn, requests restart this medication, pharmacy states no fills on this rx >6 months. Pt and I discussed hyperemesis concerns w/ cannabis use, she frequently takes gummies.  Will work to control Glc and nausea 05/19: nausea improved, still present but meds now seem to be working better. Some confusion on home seroquel  regimen but she'd like to come off this, she says she takes it 200 mg at bedtime not 400. At any rate, ok for outpatient follow up. See med rec for changes / new rx for lomotil , scopolamine , reglan       Consultants:  Psychiatry   Procedures/Surgeries: none      ASSESSMENT & PLAN:   Seizure-like activity  Psychogenic nonepileptic seizure Suspect pseudoseizures/nonepileptic Seizure, aspiration precaution, fall precautions Previous extensive workup, did not repeat  Outpatient followup   Hyperemesis Chronic nausea Suspect gastroparesis related to IDDM, cannabinoid hyperemesis, possibly opiate withdrawal Discussion on opiates as below - I advised I  will not keep her on IV medications or continue high dose opiates I cannot confirm. Pt and I discussed hyperemesis concerns w/ cannabis use, she frequently takes gummies.  Will work to control Glc and nausea Scopolamine  patch  Reglan  Outpatient followup   Hyperthyroidism Associated w/ tachycardia Question possible medication absorption difficulty due to her experiencing frequent nausea and vomiting Patient is not in thyroid  storm at this time Nancy [name] scale = 10 Home propranolol  80 mg p.o. 3 times daily methimazole  20 mg p.o. twice daily  Outpatient followup    T2DM (type 2 diabetes mellitus) Insulin  SSI with at bedtime coverage ordered Outpatient followup    Hypokalemia Magnesium  level was within normal limits Replace as needed Monitor BMP outpatient    Tarry stool Diarrhea Query Pepto-Bismol use in setting of stomach upset and diarrhea Patient appears to have baseline hemoglobin consistent with female on her menstrual cycle. Question opiate withdrawal diarrhea  Continue to monitor Low clinical suspicion for GI bleed at this time Outpatient followup    Musculoskeletal pain Abdominal pain, chronic pt continues to be nauseous w/ abdominal pain she attributes to her chronic neuropathy. She presents screen shots of medications but pharmacy cannot confirm any controlled substance dispenses, pt does not have pill bottles with her, she cannot name the pharmacy she states delivers these medications. Outpatient followup    Severe recurrent major depression without psychotic features  She reports use of Seroquel  400 mg at bedtime and 50 mg bid prn, requests restart this medication, pharmacy states no fills on this rx >6 months. Psychiatry service has been consulted by EDP, appreciate their recommendations Taper off seroquel   Outpatient followup   Hx mastitis / "boil" on L breast Significant tenderness on exam Bactrim  Outpatient followup     Class 1 obesity based on BMI:  Body mass index is 30.46 kg/m.[name] Significantly low or high BMI is associated with higher medical risk.  Underweight - under 18  overweight - 25 to 29 obese - 30 or more Class 1 obesity: BMI of 30.0 to 34 Class 2 obesity: BMI of 35.0 to 39 Class 3 obesity: BMI of 40.0 to 49 Super Morbid Obesity: BMI 50-59 Super-super Morbid Obesity: BMI 60+ Healthy nutrition and physical activity advised as adjunct to other disease management and risk reduction treatments            Discharge Instructions  Allergies as of 05/25/2023       Reactions   Oatmeal Anaphylaxis   Tomato Anaphylaxis   Acetaminophen  Other (See Comments)   Avoids due to liver   Ibuprofen  Other (See Comments)   GI MD said to not take this anymore        Medication List     STOP taking these medications    furosemide  20 MG tablet Commonly known as: LASIX    ondansetron  4 MG tablet Commonly known as: Zofran        TAKE these medications    Baqsimi  Two Pack 3 MG/DOSE Powd Generic drug: Glucagon  Place 1 spray into the nose as needed.   diphenoxylate -atropine  2.5-0.025 MG tablet Commonly known as: LOMOTIL  Take 1 tablet by mouth 4 (four) times daily as needed for diarrhea or loose stools.   Insulin  Aspart FlexPen 100 UNIT/ML Commonly known  as: NOVOLOG  Inject 15 Units into the skin as directed. Sliding scale insulin  as follows: BG 150-200  = 2 units BG  200-250 = 4 units BG  250-300 = 6 units BG  300-350 = 8 units BG  350-400 = 10 units   insulin  glargine 100 unit/mL Sopn Commonly known as: LANTUS  Inject 8 Units into the skin 2 (two) times daily.   methimazole  10 MG tablet Commonly known as: TAPAZOLE  Take 2 tablets (20 mg total) by mouth 2 (two) times daily.   metoCLOPramide  10 MG tablet Commonly known as: REGLAN  Take 1 tablet (10 mg total) by mouth 3 (three) times daily before meals.   promethazine  25 MG suppository Commonly known as: PHENERGAN  Place 25 mg rectally every 8 (eight) hours as  needed for nausea or vomiting.   propranolol  80 MG tablet Commonly known as: INDERAL  Take 1 tablet (80 mg total) by mouth 3 (three) times daily.   QUEtiapine  100 MG tablet Commonly known as: SEROQUEL  Take 2 tablets (200 mg total) by mouth at bedtime for 7 days, THEN 1 tablet (100 mg total) at bedtime for 7 days. Then can stop. Start taking on: May 25, 2023 What changed:  See the new instructions. Another medication with the same name was removed. Continue taking this medication, and follow the directions you see here.   scopolamine  1 MG/3DAYS Commonly known as: TRANSDERM-SCOP Place 1 patch (1.5 mg total) onto the skin every 3 (three) days. Start taking on: May 27, 2023   sulfamethoxazole -trimethoprim  800-160 MG tablet Commonly known as: BACTRIM  DS Take 1 tablet by mouth every 12 (twelve) hours for 5 days.         Follow-up Information     Ramji, Amrita, FNP Follow up.   Specialty: Family Medicine Why: Hospital follow up Contact information: [address] [facility] [address] [place] [address] [phone]                 Allergies  Allergen Reactions   Oatmeal Anaphylaxis   Tomato Anaphylaxis   Acetaminophen  Other (See Comments)    Avoids due to liver   Ibuprofen  Other (See Comments)    GI MD said to not take this anymore     Subjective: reports loose stool but improved w/ lomotil , nausea still present but improved, tolerating diet, she prefers for discharge today if possible    Discharge Exam: BP (!) 104/57 (BP Location: Right Arm)   Pulse 91   Temp 98.4 F (36.9 C) (Oral)   Resp 20   Ht 5\' 3"  (1.6 m)   Wt 78 kg   LMP 04/16/2023   SpO2 100%   BMI 30.46 kg/m  General: Pt is alert, awake, not in acute distress Cardiovascular: RRR, S1/S2 +, no rubs, no gallops Respiratory: CTA bilaterally, no wheezing, no rhonchi Abdominal: Soft, NT, ND, bowel sounds + Extremities: no edema, no cyanosis     The results of significant diagnostics from  this hospitalization (including imaging, microbiology, ancillary and laboratory) are listed below for reference.     Microbiology: Recent Results (from the past 240 hours)  MRSA Next Gen by PCR, Nasal     Status: None   Collection Time: 05/23/23  2:10 PM   Specimen: Nasal Mucosa; Nasal Swab  Result Value Ref Range Status   MRSA by PCR Next Gen NOT DETECTED NOT DETECTED Final    Comment: (NOTE) The GeneXpert MRSA Assay (FDA approved for NASAL specimens only), is one component of a comprehensive MRSA colonization surveillance program.  It is not intended to diagnose MRSA infection nor to guide or monitor treatment for MRSA infections. Test performance is not FDA approved in patients less than 27 years old. Performed at [facility], [address] [place], [place] [address]      Labs: BNP (last 3 results) No results for input(s): "BNP" in the last 8760 hours. Basic Metabolic Panel: Recent Labs  Lab 05/23/23 1041 05/24/23 0447  NA 134* 135  K 2.9* 3.4*  CL 100 101  CO2 22 24  GLUCOSE 272* 163*  BUN 9 13  CREATININE 0.40* 0.41*  CALCIUM  9.3 9.2  MG 1.8  --    Liver Function Tests: No results for input(s): "AST", "ALT", "ALKPHOS", "BILITOT", "PROT", "ALBUMIN" in the last 168 hours. No results for input(s): "LIPASE", "AMYLASE" in the last 168 hours. No results for input(s): "AMMONIA" in the last 168 hours. CBC: Recent Labs  Lab 05/23/23 1041 05/24/23 0447  WBC 8.7 8.8  HGB 11.8* 11.8*  HCT 35.6* 36.0  MCV 73.7* 75.3*  PLT 211 208   Cardiac Enzymes: No results for input(s): "CKTOTAL", "CKMB", "CKMBINDEX", "TROPONINI" in the last 168 hours. BNP: Invalid input(s): "POCBNP" CBG: Recent Labs  Lab 05/24/23 1226 05/24/23 1738 05/24/23 2159 05/25/23 0832 05/25/23 1210  GLUCAP 182* 173* 227* 244* 87   D-Dimer No results for input(s): "DDIMER" in the last 72 hours. Hgb A1c No results for input(s): "HGBA1C" in the last 72 hours. Lipid Profile No  results for input(s): "CHOL", "HDL", "LDLCALC", "TRIG", "CHOLHDL", "LDLDIRECT" in the last 72 hours. Thyroid  function studies Recent Labs    05/23/23 1041  TSH <0.010*   Anemia work up No results for input(s): "VITAMINB12", "FOLATE", "FERRITIN", "TIBC", "IRON", "RETICCTPCT" in the last 72 hours. Urinalysis    Component Value Date/Time   COLORURINE YELLOW (A) 05/01/2023 1535   APPEARANCEUR CLEAR (A) 05/01/2023 1535   LABSPEC 1.029 05/01/2023 1535   PHURINE 5.0 05/01/2023 1535   GLUCOSEU 150 (A) 05/01/2023 1535   HGBUR NEGATIVE 05/01/2023 1535   BILIRUBINUR NEGATIVE 05/01/2023 1535   BILIRUBINUR negative 07/13/2018 1651   KETONESUR 20 (A) 05/01/2023 1535   PROTEINUR NEGATIVE 05/01/2023 1535   UROBILINOGEN 0.2 07/13/2018 1651   UROBILINOGEN 1.0 07/09/2017 1324   NITRITE NEGATIVE 05/01/2023 1535   LEUKOCYTESUR NEGATIVE 05/01/2023 1535   Sepsis Labs Recent Labs  Lab 05/23/23 1041 05/24/23 0447  WBC 8.7 8.8   Microbiology Recent Results (from the past 240 hours)  MRSA Next Gen by PCR, Nasal     Status: None   Collection Time: 05/23/23  2:10 PM   Specimen: Nasal Mucosa; Nasal Swab  Result Value Ref Range Status   MRSA by PCR Next Gen NOT DETECTED NOT DETECTED Final    Comment: (NOTE) The GeneXpert MRSA Assay (FDA approved for NASAL specimens only), is one component of a comprehensive MRSA colonization surveillance program. It is not intended to diagnose MRSA infection nor to guide or monitor treatment for MRSA infections. Test performance is not FDA approved in patients less than 56 years old. Performed at [facility], [address] [place], [place] [address]    Imaging DG Chest [place] 1 View Result Date: 05/23/2023 CLINICAL DATA:  Seizure EXAM: PORTABLE CHEST 1 VIEW COMPARISON:  04/30/2023 FINDINGS: Single frontal view of the chest demonstrates a stable cardiac silhouette. No airspace disease, effusion, or pneumothorax. No acute bony abnormalities.  IMPRESSION: 1. No acute intrathoracic process. Electronically Signed   By: Bobbye Burrow M.D.   On: 05/23/2023  14:00   CT Head Wo Contrast Result Date: 05/23/2023 CLINICAL DATA:  Provided history: Seizure, new onset, no history of trauma. Neck trauma. EXAM: CT HEAD WITHOUT CONTRAST CT CERVICAL SPINE WITHOUT CONTRAST TECHNIQUE: Multidetector CT imaging of the head and cervical spine was performed following the standard protocol without intravenous contrast. Multiplanar CT image reconstructions of the cervical spine were also generated. RADIATION DOSE REDUCTION: This exam was performed according to the departmental dose-optimization program which includes automated exposure control, adjustment of the mA and/or kV according to patient size and/or use of iterative reconstruction technique. COMPARISON:  Head CT 05/03/2023. Cervical spine CT 05/03/2023. Thyroid  ultrasound 08/21/2021. FINDINGS: CT HEAD FINDINGS Brain: No age-advanced or lobar predominant cerebral atrophy. There is no acute intracranial hemorrhage. No demarcated cortical infarct. No extra-axial fluid collection. No evidence of an intracranial mass. No midline shift. Vascular: No hyperdense vessel. Skull: No calvarial fracture or aggressive osseous lesion. Sinuses/Orbits: No mass or acute finding within the imaged orbits. No significant paranasal sinus disease at the imaged levels. CT CERVICAL SPINE FINDINGS Alignment: Nonspecific reversal of the expected cervical lordosis. No significant spondylolisthesis. Skull base and vertebrae: The basion-dental and atlanto-dental intervals are maintained.No evidence of acute fracture to the cervical spine. Soft tissues and spinal canal: No prevertebral fluid or swelling. No visible canal hematoma. Disc levels: No appreciable spinal canal stenosis. No significant bony neural foraminal narrowing. Upper chest: No consolidation within the imaged lung apices. No visible pneumothorax. Other: Enlarged and heterogeneous  thyroid  gland, as was demonstrated on the prior thyroid  ultrasound of 08/21/2021. Please refer to this prior examination for further description. IMPRESSION: CT head: No evidence of an acute intracranial abnormality. CT cervical spine: 1. No evidence of an acute cervical spine fracture. 2. Nonspecific reversal of the expected cervical lordosis. Electronically Signed   By: Bascom Lily D.O.   On: 05/23/2023 11:20   CT Cervical Spine Wo Contrast Result Date: 05/23/2023 CLINICAL DATA:  Provided history: Seizure, new onset, no history of trauma. Neck trauma. EXAM: CT HEAD WITHOUT CONTRAST CT CERVICAL SPINE WITHOUT CONTRAST TECHNIQUE: Multidetector CT imaging of the head and cervical spine was performed following the standard protocol without intravenous contrast. Multiplanar CT image reconstructions of the cervical spine were also generated. RADIATION DOSE REDUCTION: This exam was performed according to the departmental dose-optimization program which includes automated exposure control, adjustment of the mA and/or kV according to patient size and/or use of iterative reconstruction technique. COMPARISON:  Head CT 05/03/2023. Cervical spine CT 05/03/2023. Thyroid  ultrasound 08/21/2021. FINDINGS: CT HEAD FINDINGS Brain: No age-advanced or lobar predominant cerebral atrophy. There is no acute intracranial hemorrhage. No demarcated cortical infarct. No extra-axial fluid collection. No evidence of an intracranial mass. No midline shift. Vascular: No hyperdense vessel. Skull: No calvarial fracture or aggressive osseous lesion. Sinuses/Orbits: No mass or acute finding within the imaged orbits. No significant paranasal sinus disease at the imaged levels. CT CERVICAL SPINE FINDINGS Alignment: Nonspecific reversal of the expected cervical lordosis. No significant spondylolisthesis. Skull base and vertebrae: The basion-dental and atlanto-dental intervals are maintained.No evidence of acute fracture to the cervical spine. Soft  tissues and spinal canal: No prevertebral fluid or swelling. No visible canal hematoma. Disc levels: No appreciable spinal canal stenosis. No significant bony neural foraminal narrowing. Upper chest: No consolidation within the imaged lung apices. No visible pneumothorax. Other: Enlarged and heterogeneous thyroid  gland, as was demonstrated on the prior thyroid  ultrasound of 08/21/2021. Please refer to this prior examination for further description. IMPRESSION: CT head: No evidence of  an acute intracranial abnormality. CT cervical spine: 1. No evidence of an acute cervical spine fracture. 2. Nonspecific reversal of the expected cervical lordosis. Electronically Signed   By: Bascom Lily D.O.   On: 05/23/2023 11:20      Time coordinating discharge: over 30 minutes  SIGNED:  Izabel Chim DO [facility]

## 2023-06-05 ENCOUNTER — Observation Stay: Admission: EM | Admit: 2023-06-05 | Discharge: 2023-06-06 | Disposition: A | Discharge status: Home / Self Care

## 2023-06-05 ENCOUNTER — Other Ambulatory Visit: Payer: Self-pay

## 2023-06-05 ENCOUNTER — Emergency Department

## 2023-06-05 DIAGNOSIS — G8929 Other chronic pain: Secondary | ICD-10-CM | POA: Diagnosis not present

## 2023-06-05 DIAGNOSIS — R112 Nausea with vomiting, unspecified: Principal | ICD-10-CM | POA: Insufficient documentation

## 2023-06-05 DIAGNOSIS — R1010 Upper abdominal pain, unspecified: Principal | ICD-10-CM | POA: Insufficient documentation

## 2023-06-05 DIAGNOSIS — Z794 Long term (current) use of insulin: Secondary | ICD-10-CM | POA: Insufficient documentation

## 2023-06-05 DIAGNOSIS — F32A Depression, unspecified: Secondary | ICD-10-CM | POA: Insufficient documentation

## 2023-06-05 DIAGNOSIS — E1110 Type 2 diabetes mellitus with ketoacidosis without coma: Secondary | ICD-10-CM | POA: Insufficient documentation

## 2023-06-05 DIAGNOSIS — E876 Hypokalemia: Secondary | ICD-10-CM | POA: Diagnosis present

## 2023-06-05 DIAGNOSIS — I509 Heart failure, unspecified: Secondary | ICD-10-CM | POA: Diagnosis not present

## 2023-06-05 DIAGNOSIS — E66811 Obesity, class 1: Secondary | ICD-10-CM | POA: Diagnosis not present

## 2023-06-05 DIAGNOSIS — F419 Anxiety disorder, unspecified: Secondary | ICD-10-CM | POA: Diagnosis not present

## 2023-06-05 DIAGNOSIS — E1143 Type 2 diabetes mellitus with diabetic autonomic (poly)neuropathy: Secondary | ICD-10-CM | POA: Diagnosis not present

## 2023-06-05 DIAGNOSIS — E0590 Thyrotoxicosis, unspecified without thyrotoxic crisis or storm: Secondary | ICD-10-CM | POA: Diagnosis not present

## 2023-06-05 DIAGNOSIS — F17290 Nicotine dependence, other tobacco product, uncomplicated: Secondary | ICD-10-CM | POA: Insufficient documentation

## 2023-06-05 DIAGNOSIS — Z6830 Body mass index (BMI) 30.0-30.9, adult: Secondary | ICD-10-CM | POA: Insufficient documentation

## 2023-06-05 DIAGNOSIS — G40909 Epilepsy, unspecified, not intractable, without status epilepticus: Secondary | ICD-10-CM | POA: Diagnosis not present

## 2023-06-05 DIAGNOSIS — K3184 Gastroparesis: Secondary | ICD-10-CM

## 2023-06-05 DIAGNOSIS — R110 Nausea: Secondary | ICD-10-CM

## 2023-06-05 DIAGNOSIS — E1169 Type 2 diabetes mellitus with other specified complication: Secondary | ICD-10-CM

## 2023-06-05 LAB — CBC WITH DIFFERENTIAL/PLATELET
Abs Immature Granulocytes: 0.01 10*3/uL (ref 0.00–0.07)
Basophils Absolute: 0 10*3/uL (ref 0.0–0.1)
Basophils Relative: 1 %
Eosinophils Absolute: 0 10*3/uL (ref 0.0–0.5)
Eosinophils Relative: 1 %
HCT: 37.9 % (ref 36.0–46.0)
Hemoglobin: 12.2 g/dL (ref 12.0–15.0)
Immature Granulocytes: 0 %
Lymphocytes Relative: 41 %
Lymphs Abs: 2.4 10*3/uL (ref 0.7–4.0)
MCH: 24.4 pg — ABNORMAL LOW (ref 26.0–34.0)
MCHC: 32.2 g/dL (ref 30.0–36.0)
MCV: 75.8 fL — ABNORMAL LOW (ref 80.0–100.0)
Monocytes Absolute: 0.7 10*3/uL (ref 0.1–1.0)
Monocytes Relative: 11 %
Neutro Abs: 2.8 10*3/uL (ref 1.7–7.7)
Neutrophils Relative %: 46 %
Platelets: 330 10*3/uL (ref 150–400)
RBC: 5 MIL/uL (ref 3.87–5.11)
RDW: 14.4 % (ref 11.5–15.5)
WBC: 6 10*3/uL (ref 4.0–10.5)
nRBC: 0 % (ref 0.0–0.2)

## 2023-06-05 LAB — TROPONIN I (HIGH SENSITIVITY): Troponin I (High Sensitivity): 5 ng/L (ref <18)

## 2023-06-05 LAB — COMPREHENSIVE METABOLIC PANEL WITH GFR
ALT: 31 U/L (ref 0–44)
AST: 31 U/L (ref 15–41)
Albumin: 4.2 g/dL (ref 3.5–5.0)
Alkaline Phosphatase: 82 U/L (ref 38–126)
Anion gap: 14 (ref 5–15)
BUN: 12 mg/dL (ref 6–20)
CO2: 22 mmol/L (ref 22–32)
Calcium: 9.2 mg/dL (ref 8.9–10.3)
Chloride: 102 mmol/L (ref 98–111)
Creatinine, Ser: 0.51 mg/dL (ref 0.44–1.00)
GFR, Estimated: >60 mL/min (ref >60)
Glucose, Bld: 177 mg/dL — ABNORMAL HIGH (ref 70–99)
Potassium: 3.1 mmol/L — ABNORMAL LOW (ref 3.5–5.1)
Sodium: 138 mmol/L (ref 135–145)
Total Bilirubin: 1.2 mg/dL (ref 0.0–1.2)
Total Protein: 7.4 g/dL (ref 6.5–8.1)

## 2023-06-05 LAB — RESP PANEL BY RT-PCR (RSV, FLU A&B, COVID)  RVPGX2
Influenza A by PCR: NEGATIVE
Influenza B by PCR: NEGATIVE
Resp Syncytial Virus by PCR: NEGATIVE
SARS Coronavirus 2 by RT PCR: NEGATIVE

## 2023-06-05 LAB — GLUCOSE, CAPILLARY: Glucose-Capillary: 103 mg/dL — ABNORMAL HIGH (ref 70–99)

## 2023-06-05 LAB — HCG, QUANTITATIVE, PREGNANCY: hCG, Beta Chain, Quant, S: <1 m[IU]/mL (ref <5)

## 2023-06-05 MED ORDER — DIPHENOXYLATE-ATROPINE 2.5-0.025 MG PO TABS: 1.0000 | ORAL_TABLET | Freq: Four times a day (QID) | ORAL | Status: DC | PRN

## 2023-06-05 MED ORDER — IOHEXOL 300 MG/ML  SOLN
100.0000 mL | Freq: Once | INTRAMUSCULAR | Status: AC | PRN
  Administered 2023-06-05: 100 mL via INTRAVENOUS

## 2023-06-05 MED ORDER — ONDANSETRON HCL 4 MG PO TABS: 4.0000 mg | ORAL_TABLET | Freq: Four times a day (QID) | ORAL | Status: DC | PRN

## 2023-06-05 MED ORDER — LEVETIRACETAM (KEPPRA) 500 MG/5 ML ADULT IV PUSH
25.0000 mg/kg | Freq: Once | INTRAVENOUS | Status: AC
  Administered 2023-06-05: 2000 mg via INTRAVENOUS
  Filled 2023-06-05: qty 20

## 2023-06-05 MED ORDER — PROPRANOLOL HCL 40 MG PO TABS
80.0000 mg | ORAL_TABLET | Freq: Three times a day (TID) | ORAL | Status: DC
  Administered 2023-06-05 – 2023-06-06 (×2): 80 mg via ORAL
  Filled 2023-06-05 (×3): qty 2

## 2023-06-05 MED ORDER — HALOPERIDOL LACTATE 5 MG/ML IJ SOLN
2.0000 mg | Freq: Once | INTRAMUSCULAR | Status: AC
  Administered 2023-06-05: 2 mg via INTRAVENOUS
  Filled 2023-06-05: qty 1

## 2023-06-05 MED ORDER — POTASSIUM CHLORIDE IN NACL 20-0.9 MEQ/L-% IV SOLN
INTRAVENOUS | Status: DC
  Filled 2023-06-05 (×3): qty 1000

## 2023-06-05 MED ORDER — METOCLOPRAMIDE HCL 5 MG/ML IJ SOLN: 10.0000 mg | Freq: Three times a day (TID) | INTRAMUSCULAR | Status: DC

## 2023-06-05 MED ORDER — MORPHINE SULFATE (PF) 4 MG/ML IV SOLN
4.0000 mg | Freq: Once | INTRAVENOUS | Status: AC
  Administered 2023-06-05: 4 mg via INTRAVENOUS
  Filled 2023-06-05: qty 1

## 2023-06-05 MED ORDER — ONDANSETRON HCL 4 MG/2ML IJ SOLN
4.0000 mg | Freq: Once | INTRAMUSCULAR | Status: AC
  Administered 2023-06-05: 4 mg via INTRAVENOUS
  Filled 2023-06-05: qty 2

## 2023-06-05 MED ORDER — ONDANSETRON HCL 4 MG/2ML IJ SOLN: 4.0000 mg | Freq: Four times a day (QID) | INTRAMUSCULAR | Status: DC | PRN

## 2023-06-05 MED ORDER — TRAZODONE HCL 50 MG PO TABS
25.0000 mg | ORAL_TABLET | Freq: Every evening | ORAL | Status: DC | PRN
  Administered 2023-06-05: 25 mg via ORAL
  Filled 2023-06-05: qty 1

## 2023-06-05 MED ORDER — METOCLOPRAMIDE HCL 5 MG/ML IJ SOLN
10.0000 mg | Freq: Once | INTRAMUSCULAR | Status: AC
  Administered 2023-06-05: 10 mg via INTRAVENOUS
  Filled 2023-06-05: qty 2

## 2023-06-05 MED ORDER — METHIMAZOLE 10 MG PO TABS
20.0000 mg | ORAL_TABLET | Freq: Two times a day (BID) | ORAL | Status: DC
  Administered 2023-06-06: 20 mg via ORAL
  Filled 2023-06-05: qty 2

## 2023-06-05 MED ORDER — INSULIN GLARGINE-YFGN 100 UNIT/ML ~~LOC~~ SOLN
8.0000 [IU] | Freq: Two times a day (BID) | SUBCUTANEOUS | Status: DC
  Administered 2023-06-05 – 2023-06-06 (×2): 8 [IU] via SUBCUTANEOUS
  Filled 2023-06-05 (×3): qty 0.08

## 2023-06-05 MED ORDER — SODIUM CHLORIDE 0.9 % IV BOLUS
1000.0000 mL | Freq: Once | INTRAVENOUS | Status: AC
  Administered 2023-06-05: 1000 mL via INTRAVENOUS

## 2023-06-05 MED ORDER — ENOXAPARIN SODIUM 40 MG/0.4ML IJ SOSY
40.0000 mg | PREFILLED_SYRINGE | INTRAMUSCULAR | Status: DC
  Administered 2023-06-06: 40 mg via SUBCUTANEOUS
  Filled 2023-06-05: qty 0.4

## 2023-06-05 MED ORDER — QUETIAPINE FUMARATE 25 MG PO TABS
100.0000 mg | ORAL_TABLET | Freq: Every day | ORAL | Status: DC
  Administered 2023-06-05: 100 mg via ORAL
  Filled 2023-06-05: qty 4

## 2023-06-05 MED ORDER — MORPHINE SULFATE (PF) 2 MG/ML IV SOLN
2.0000 mg | Freq: Once | INTRAVENOUS | Status: AC
  Administered 2023-06-05: 2 mg via INTRAVENOUS
  Filled 2023-06-05: qty 1

## 2023-06-05 MED ORDER — MAGNESIUM HYDROXIDE 400 MG/5ML PO SUSP: 30.0000 mL | Freq: Every day | ORAL | Status: DC | PRN

## 2023-06-05 MED ORDER — POTASSIUM CHLORIDE 20 MEQ PO PACK
40.0000 meq | PACK | Freq: Once | ORAL | Status: AC
  Administered 2023-06-06: 40 meq via ORAL
  Filled 2023-06-05: qty 2

## 2023-06-05 NOTE — ED Notes (Signed)
 MD made aware pt is still in 10 out of 10 pain.

## 2023-06-05 NOTE — ED Notes (Signed)
 This IT consultant at bedside. Pt begins to have full body jerking but once told to hand us  her arm she follows commands.

## 2023-06-05 NOTE — ED Notes (Signed)
 Pt answering all questions appropriately at this time. GCS 15.

## 2023-06-05 NOTE — ED Triage Notes (Addendum)
 Pt to ED via [facility] from home for seizures. Pt has known hx of seizures and takes medication. Unknown what meds pt takes. Per EMS, pt had four seizures prior to EMS arrival. Per EMS, pt has been seizing since their arrival with 30 seconds- 1 minute of seizing followed by 30 seconds of post ictal  and then pt would continue to seize. Per EMS, they gave a total of 10mg  Versed  in route. Per EMS pt was able to maintain airway and is 100% on RA on arrival.    EMS vitals BP 160/110 SPO2 100% RA  20g IV

## 2023-06-05 NOTE — ED Provider Notes (Signed)
 [facility] Provider Note    Event Date/Time   First MD Initiated Contact with Patient 06/05/23 1506     (approximate)   History   Seizures  Pt to ED via [facility] from home for seizures. Pt has known hx of seizures and takes medication. Unknown what meds pt takes. Per EMS, pt had four seizures prior to EMS arrival. Per EMS, pt has been seizing since their arrival with 30 seconds- 1 minute of seizing followed by 30 seconds of post ictal  and then pt would continue to seize. Per EMS, they gave a total of 10mg  Versed  in route. Per EMS pt was able to maintain airway and is 100% on RA on arrival.    EMS vitals BP 160/110 SPO2 100% RA  20g IV    HPI Nancy [name] is a 23 y.o. female PMH T2DM complicated by DKA, polysubstance use, diabetic gastroparesis, seizure-like episodes/nonepileptic seizures, hypothyroidism presents for evaluation of seizure-like activity - Patient reportedly had multiple seizure-like episodes at home as well as with EMS.  Received 10 mg Versed  en route. - I was initially called to room for seizure-like episode shortly after patient arrival, did have some full body rhythmic movements though this immediately aborted with mild noxious stimuli to left hand.  Patient immediately oriented, able to provide full history.  Appears she has been having some episodes of abdominal pain for the past 2-3 days accompanied with a few episodes of nonbloody vomiting.  No diarrhea.  Pain primarily localizes to mid and upper abdomen.  Reports remote history of hernia repair as a child, no other abdominal surgical history.  No vaginal bleeding or discharge.  No urinary symptoms.  Per chart review, patient has had extensive seizure workup in the past, felt to be nonepileptic seizures by neurology.    Physical Exam   Triage Vital Signs: ED Triage Vitals  Encounter Vitals Group     BP 06/05/23 1513 (!) 181/115     Systolic BP Percentile --      Diastolic BP  Percentile --      Pulse Rate 06/05/23 1513 (!) 128     Resp 06/05/23 1513 (!) 22     Temp 06/05/23 1513 98.2 F (36.8 C)     Temp Source 06/05/23 1513 Oral     SpO2 06/05/23 1513 100 %     Weight 06/05/23 1514 171 lb 15.3 oz (78 kg)     Height 06/05/23 1514 5\' 3"  (1.6 m)     Head Circumference --      Peak Flow --      Pain Score 06/05/23 1514 9     Pain Loc --      Pain Education --      Exclude from Growth Chart --     Most recent vital signs: Vitals:   06/05/23 2130 06/05/23 2230  BP:  116/67  Pulse: 95 (!) 108  Resp: 19 16  Temp:    SpO2: 99% 99%     General: Awake, no distress.  CV:  Good peripheral perfusion. RRR, RP 2+ Resp:  Normal effort. CTAB Abd:  No distention.  Notably tender in epigastrium, mid upper abdomen, right upper quadrant.  No lower abdominal tenderness.  No CVA tenderness bilaterally. Neuro: AO x 4, moving all extremity spontaneously, no focal motor deficit appreciated   ED Results / Procedures / Treatments   Labs (all labs ordered are listed, but only abnormal results are displayed) Labs Reviewed  COMPREHENSIVE  METABOLIC PANEL WITH GFR - Abnormal; Notable for the following components:      Result Value   Potassium 3.1 (*)    Glucose, Bld 177 (*)    All other components within normal limits  CBC WITH DIFFERENTIAL/PLATELET - Abnormal; Notable for the following components:   MCV 75.8 (*)    MCH 24.4 (*)    All other components within normal limits  GLUCOSE, CAPILLARY - Abnormal; Notable for the following components:   Glucose-Capillary 103 (*)    All other components within normal limits  RESP PANEL BY RT-PCR (RSV, FLU A&B, COVID)  RVPGX2  HCG, QUANTITATIVE, PREGNANCY  URINALYSIS, W/ REFLEX TO CULTURE (INFECTION SUSPECTED)  BASIC METABOLIC PANEL WITH GFR  CBC  TROPONIN I (HIGH SENSITIVITY)     EKG  See ED course below   RADIOLOGY Radiology interpreted by myself and radiology reports reviewed.    PROCEDURES:  Critical Care  performed: No  Procedures   MEDICATIONS ORDERED IN ED: Medications  propranolol  (INDERAL ) tablet 80 mg (has no administration in time range)  QUEtiapine  (SEROQUEL ) tablet 100 mg (has no administration in time range)  insulin  glargine-yfgn (SEMGLEE ) injection 8 Units (has no administration in time range)  methimazole  (TAPAZOLE ) tablet 20 mg (has no administration in time range)  diphenoxylate -atropine  (LOMOTIL ) 2.5-0.025 MG per tablet 1 tablet (has no administration in time range)  metoCLOPramide  (REGLAN ) injection 10 mg (has no administration in time range)  enoxaparin  (LOVENOX ) injection 40 mg (has no administration in time range)  0.9 % NaCl with KCl 20 mEq/ L  infusion ( Intravenous New Bag/Given 06/05/23 2237)  traZODone  (DESYREL ) tablet 25 mg (has no administration in time range)  magnesium  hydroxide (MILK OF MAGNESIA) suspension 30 mL (has no administration in time range)  ondansetron  (ZOFRAN ) tablet 4 mg (has no administration in time range)    Or  ondansetron  (ZOFRAN ) injection 4 mg (has no administration in time range)  levETIRAcetam  (KEPPRA ) undiluted injection 2,000 mg (2,000 mg Intravenous Given 06/05/23 1530)  morphine  (PF) 2 MG/ML injection 2 mg (2 mg Intravenous Given 06/05/23 1614)  ondansetron  (ZOFRAN ) injection 4 mg (4 mg Intravenous Given 06/05/23 1614)  morphine  (PF) 4 MG/ML injection 4 mg (4 mg Intravenous Given 06/05/23 1633)  iohexol  (OMNIPAQUE ) 300 MG/ML solution 100 mL (100 mLs Intravenous Contrast Given 06/05/23 1658)  haloperidol  lactate (HALDOL ) injection 2 mg (2 mg Intravenous Given 06/05/23 1925)  sodium chloride  0.9 % bolus 1,000 mL (0 mLs Intravenous Stopped 06/05/23 2231)  morphine  (PF) 4 MG/ML injection 4 mg (4 mg Intravenous Given 06/05/23 2232)  metoCLOPramide  (REGLAN ) injection 10 mg (10 mg Intravenous Given 06/05/23 2231)     IMPRESSION / MDM / ASSESSMENT AND PLAN / ED COURSE  I reviewed the triage vital signs and the nursing notes.                               DDX/MDM/AP: Differential diagnosis includes, but is not limited to, suspect nonepileptic seizures given no apparent postictal state here no patient was appropriately treated for possible status epilepticus on arrival with a load of Keppra .  Doubt acute intracranial pathology or status epilepticus at this time.  Appears primary complaint is actually abdominal pain, known history of gastroparesis and gastritis, suspect this is likely etiology though consider other intra-abdominal pathology including cholecystitis, pancreatitis, less likely appendicitis or diverticulitis, considered but doubt perforated viscus.  Also consider viral syndrome such as COVID-19 or influenza causing gastroenteritis.  Doubt urinary pathology.  Plan: - Keppra  loaded on arrival - Labs - CT head, CT abdomen pelvis - Pain control, antiemetics - EKG - Reassess  Patient's presentation is most consistent with acute presentation with potential threat to life or bodily function.  The patient is on the cardiac monitor to evaluate for evidence of arrhythmia and/or significant heart rate changes.  ED course below.  Workup unremarkable including CT abdomen pelvis and CT head.  Patient did have multiple episodes of rhythmic body movements though these immediately stopped with either noxious stimuli or speaking to patient and then she resumes full conversation with me--not consistent with seizures.  Does have persistent abdominal discomfort and nausea despite multiple rounds of antiemetics and pain medications.  Overall suspect likely recurrence of her presumed diabetic gastroparesis for which she has been admitted multiple times in the past.  Admitted to hospitalist service.  Clinical Course as of 06/05/23 2317  Fri Jun 05, 2023  1528 Called to bedside for seizure-like activity. Some rhythmic movement but immediately aborted with noxious stimuli to fingertip.  Immediately ANO x 4, able to cooperate with full history. [MM]   1546 Ecg = sinus tachycardia, rate 127, no gross ST elevation or depression, no significant repolarization abnormality, normal axis, normal intervals.  No evidence of ischemia nor arrhythmia on my interpretation. [MM]  1905 CTAP: IMPRESSION: No acute localizing findings in the abdomen or pelvis.   [MM]  1922 Still complaining of abdominal pain.  History diabetic gastroparesis.  Has already received multiple rounds of pain medications and Zofran .  Will trial Haldol  and give further liter IV fluid due to persistent tachycardia. [MM]  2059 Reevaluated, still complaining of abdominal discomfort.  Has required serial rounds of pain medications.  I wonder if diabetic gastroparesis may be contributing to presentation [MM]  2103 Patient reevaluated, still complaining of abdominal pain and remains tachycardic.  Does appear to have some baseline tachycardia on prior chart review.  Will give further pain medication here.  I do not believe we are missing any acute underlying intra-abdominal pathology but she is requiring serial pain dosing here.  Will discuss with hospitalist for possible admission, consult order placed [MM]    Clinical Course User Index [MM] Collis Deaner, MD     FINAL CLINICAL IMPRESSION(S) / ED DIAGNOSES   Final diagnoses:  Pain of upper abdomen  Hypokalemia  Nausea     Rx / DC Orders   ED Discharge Orders     None        Note:  This document was prepared using Dragon voice recognition software and may include unintentional dictation errors.   Collis Deaner, MD 06/05/23 [phone]

## 2023-06-05 NOTE — ED Notes (Signed)
 Upon checking patient it was found that she had no vital sign leads still connected.  She says that she is not removing them, but that she is rolling around in the bed due to pain, and she cannot help it.  Patient was reattached, and informed that she has had lots of pain medication, and was asked where it hurts.  She said her belly hurts.  Patient then began writhing around and gyrating on the bed.  When speaking, she is able to stop all movement, and speak calmly and clearly.  When this nurse left the room, she began moaning loudly so that she is audible from down the hall.  Dr. Cam Cava has been informed of reported pain level.

## 2023-06-05 NOTE — ED Notes (Addendum)
 Pt having jerking motion and MD at bedside due to seizure like activity. Pt becomes responsive after noxious stimuli by MD. Pt stating to this RN "it's like I'm in a deep sleep" and pt stating that it's because of her pain and she is focused on her pain. Pt stating she is unable to stay comfortable. Pt moving around in bed and not staying still. Pt educated that she is removing all the monitoring equipment. Pt continues to move around in bed and remove leads.

## 2023-06-05 NOTE — Assessment & Plan Note (Addendum)
-   Will continue methimazole  and propranolol ..

## 2023-06-05 NOTE — Assessment & Plan Note (Signed)
-   Will continue Seroquel.

## 2023-06-05 NOTE — H&P (Addendum)
 [place]   PATIENT NAME: Nancy [name]    MR#:  [id number]  DATE OF BIRTH:  2000-12-07  DATE OF ADMISSION:  06/05/2023  PRIMARY CARE PHYSICIAN: Ramji, Amrita, FNP   Patient is coming from: Home  REQUESTING/REFERRING PHYSICIAN: Collis Deaner, MD   CHIEF COMPLAINT:   Chief Complaint  Patient presents with   Seizures    HISTORY OF PRESENT ILLNESS:  Nancy [name] is a 23 y.o. African-American female with medical history significant for seizures, CHF, anxiety, depression and type 2 diabetes mellitus, who presented to the emergency room with acute onset of intractable nonbilious and nonbloody nausea and vomiting over the last 3 days.  She felt slightly better yesterday however symptoms recurred this afternoon.  She denied any diarrhea.  She admitted to generalized abdominal pain with her symptoms.  She was shaking all over and was initially thought to have seizures.  No melena or bright red bleeding per rectum.  She has been having chills without reported fever.  No dysuria, oliguria or hematuria or flank pain.  No other bleeding diathesis.  ED Course: When the patient came to the ER, BP was 144/102 with heart rate of 121 and otherwise normal vital signs.  Labs revealed hypokalemia 3.1 and blood glucose of 177 with otherwise unremarkable CMP.  CBC showed microcytosis.  Serum pregnancy test was negative.  Respiratory panel came back negative. EKG as reviewed by me : EKG showed sinus tachycardia with a rate of 127 with PVCs, right atrial enlargement. Imaging: Noncontrast head CT scan revealed no acute intracranial normalities.  Abdominal and pelvic CT scan with contrast revealed no acute abnormalities.  Patient was given one 1 L bolus of IV normal saline and 4 mg of IV Zofran , 4 mg of IV morphine  sulfate twice and 2 mg prior to that.  She was also given 10 mg of IV Reglan  and 2 mg of IV Haldol .  For suspected initial seizures/pseudoseizures she was given loading with 2 g of IV  Keppra . PAST MEDICAL HISTORY:   Past Medical History:  Diagnosis Date   Acanthosis nigricans    Anxiety    CHF (congestive heart failure) (HCC)    Chronic lower back pain    Depression    DKA, type 1 (HCC) 09/13/2018   Dyspepsia    Obesity    Ovarian cyst    pt is not aware of this hx (11/24/2017)   Precocious adrenarche (HCC)    Premature baby    Seizures (HCC)    Type II diabetes mellitus (HCC)    insulin  dependant    PAST SURGICAL HISTORY:   Past Surgical History:  Procedure Laterality Date   ABDOMINAL HERNIA REPAIR     "I was a baby"   BIOPSY  10/12/2018   Procedure: BIOPSY;  Surgeon: Lajuan Pila, MD;  Location: [facility] ENDOSCOPY;  Service: Endoscopy;;   BIOPSY  02/28/2020   Procedure: BIOPSY;  Surgeon: Annis Kinder, DO;  Location: [facility] ENDOSCOPY;  Service: Gastroenterology;;   ESOPHAGOGASTRODUODENOSCOPY (EGD) WITH PROPOFOL  N/A 10/12/2018   Procedure: ESOPHAGOGASTRODUODENOSCOPY (EGD) WITH PROPOFOL ;  Surgeon: Lajuan Pila, MD;  Location: [facility] ENDOSCOPY;  Service: Endoscopy;  Laterality: N/A;   ESOPHAGOGASTRODUODENOSCOPY (EGD) WITH PROPOFOL  N/A 02/28/2020   Procedure: ESOPHAGOGASTRODUODENOSCOPY (EGD) WITH PROPOFOL ;  Surgeon: Annis Kinder, DO;  Location: [facility] ENDOSCOPY;  Service: Gastroenterology;  Laterality: N/A;   FLEXIBLE SIGMOIDOSCOPY N/A 02/28/2020   Procedure: FLEXIBLE SIGMOIDOSCOPY;  Surgeon: Annis Kinder, DO;  Location: [facility] ENDOSCOPY;  Service:  Gastroenterology;  Laterality: N/A;   HERNIA REPAIR     LEFT HEART CATH AND CORONARY ANGIOGRAPHY N/A 10/13/2018   Procedure: LEFT HEART CATH AND CORONARY ANGIOGRAPHY;  Surgeon: Odie Benne, MD;  Location: [facility] INVASIVE CV LAB;  Service: Cardiovascular;  Laterality: N/A;   TONSILLECTOMY AND ADENOIDECTOMY     WISDOM TOOTH EXTRACTION  2017    SOCIAL HISTORY:   Social History   Tobacco Use   Smoking status: Every Day    Types: E-cigarettes   Smokeless tobacco: Never  Substance Use Topics   Alcohol use: No     Alcohol/week: 0.0 standard drinks of alcohol    FAMILY HISTORY:   Family History  Problem Relation Age of Onset   Diabetes Mother    Hypertension Mother    Obesity Mother    Asthma Mother    Allergic rhinitis Mother    Eczema Mother    Cervical cancer Mother    Diabetes Father    Hypertension Father    Obesity Father    Hyperlipidemia Father    Hypertension Paternal Aunt    Hypertension Maternal Grandfather    Colon cancer Maternal Grandfather    Diabetes Paternal Grandmother    Obesity Paternal Grandmother    Diabetes Paternal Grandfather    Obesity Paternal Grandfather    Angioedema Neg Hx    Immunodeficiency Neg Hx    Urticaria Neg Hx    Stomach cancer Neg Hx    Esophageal cancer Neg Hx     DRUG ALLERGIES:   Allergies  Allergen Reactions   Oatmeal Anaphylaxis   Tomato Anaphylaxis   Acetaminophen  Other (See Comments)    Avoids due to liver   Ibuprofen  Other (See Comments)    GI MD said to not take this anymore    REVIEW OF SYSTEMS:   ROS As per history of present illness. All pertinent systems were reviewed above. Constitutional, HEENT, cardiovascular, respiratory, GI, GU, musculoskeletal, neuro, psychiatric, endocrine, integumentary and hematologic systems were reviewed and are otherwise negative/unremarkable except for positive findings mentioned above in the HPI.   MEDICATIONS AT HOME:   Prior to Admission medications   Medication Sig Start Date End Date Taking? Authorizing Provider  diphenoxylate -atropine  (LOMOTIL ) 2.5-0.025 MG tablet Take 1 tablet by mouth 4 (four) times daily as needed for diarrhea or loose stools. 05/25/23   Alexander, Natalie, DO  Glucagon  (BAQSIMI  TWO PACK) 3 MG/DOSE POWD Place 1 spray into the nose as needed. 08/23/21   Wouk, Haynes Lips, MD  Insulin  Aspart FlexPen (NOVOLOG ) 100 UNIT/ML Inject 15 Units into the skin as directed. Sliding scale insulin  as follows: BG 150-200  = 2 units BG  200-250 = 4 units BG  250-300 = 6  units BG  300-350 = 8 units BG  350-400 = 10 units 01/09/23   [provider]  insulin  glargine (LANTUS ) 100 unit/mL SOPN Inject 8 Units into the skin 2 (two) times daily. 05/25/23   Alexander, Natalie, DO  methimazole  (TAPAZOLE ) 10 MG tablet Take 2 tablets (20 mg total) by mouth 2 (two) times daily. 10/05/21   Garrison Kanner, MD  metoCLOPramide  (REGLAN ) 10 MG tablet Take 1 tablet (10 mg total) by mouth 3 (three) times daily before meals. 05/25/23 06/24/23  Alexander, Natalie, DO  promethazine  (PHENERGAN ) 25 MG suppository Place 25 mg rectally every 8 (eight) hours as needed for nausea or vomiting. Patient not taking: Reported on 05/23/2023 05/23/22   [provider]  propranolol  (INDERAL ) 80 MG tablet Take 1 tablet (80  mg total) by mouth 3 (three) times daily. 05/25/23   Alexander, Natalie, DO  QUEtiapine  (SEROQUEL ) 100 MG tablet Take 2 tablets (200 mg total) by mouth at bedtime for 7 days, THEN 1 tablet (100 mg total) at bedtime for 7 days. Then can stop. 05/25/23 06/08/23  Alexander, Natalie, DO  scopolamine  (TRANSDERM-SCOP) 1 MG/3DAYS Place 1 patch (1.5 mg total) onto the skin every 3 (three) days. 05/27/23   Alexander, Natalie, DO  prochlorperazine  (COMPAZINE ) 25 MG suppository PLACE 1 SUPPOSITORY (25 MG TOTAL) RECTALLY EVERY TWELVE HOURS AS NEEDED FOR NAUSEA OR VOMITING. 02/29/20 02/29/20  Ghimire, Estil Heman, MD      VITAL SIGNS:  Blood pressure (!) 143/95, pulse (!) 114, temperature 97.7 F (36.5 C), temperature source Oral, resp. rate 20, height 5\' 3"  (1.6 m), weight 78 kg, last menstrual period 05/07/2023, SpO2 100%.  PHYSICAL EXAMINATION:  Physical Exam  GENERAL:  23 y.o.-year-old African-American female patient lying in the bed with no acute distress.  EYES: Pupils equal, round, reactive to light and accommodation. No scleral icterus. Extraocular muscles intact.  HEENT: Head atraumatic, normocephalic. Oropharynx and nasopharynx clear.  NECK:  Supple, no jugular venous distention. No  thyroid  enlargement, no tenderness.  LUNGS: Normal breath sounds bilaterally, no wheezing, rales,rhonchi or crepitation. No use of accessory muscles of respiration.  CARDIOVASCULAR: Regular rate and rhythm, S1, S2 normal. No murmurs, rubs, or gallops.  ABDOMEN: Soft, nondistended, nontender. Bowel sounds present. No organomegaly or mass.  EXTREMITIES: No pedal edema, cyanosis, or clubbing.  NEUROLOGIC: Cranial nerves II through XII are intact. Muscle strength 5/5 in all extremities. Sensation intact. Gait not checked.  PSYCHIATRIC: The patient is alert and oriented x 3.  Normal affect and good eye contact. SKIN: No obvious rash, lesion, or ulcer.   LABORATORY PANEL:   CBC Recent Labs  Lab 06/05/23 1518  WBC 6.0  HGB 12.2  HCT 37.9  PLT 330   ------------------------------------------------------------------------------------------------------------------  Chemistries  Recent Labs  Lab 06/05/23 1518  NA 138  K 3.1*  CL 102  CO2 22  GLUCOSE 177*  BUN 12  CREATININE 0.51  CALCIUM  9.2  AST 31  ALT 31  ALKPHOS 82  BILITOT 1.2   ------------------------------------------------------------------------------------------------------------------  Cardiac Enzymes No results for input(s): "TROPONINI" in the last 168 hours. ------------------------------------------------------------------------------------------------------------------  RADIOLOGY:  CT Head Wo Contrast Result Date: 06/05/2023 CLINICAL DATA:  seizures EXAM: CT HEAD WITHOUT CONTRAST TECHNIQUE: Contiguous axial images were obtained from the base of the skull through the vertex without intravenous contrast. RADIATION DOSE REDUCTION: This exam was performed according to the departmental dose-optimization program which includes automated exposure control, adjustment of the mA and/or kV according to patient size and/or use of iterative reconstruction technique. COMPARISON:  May 23, 2023, May 03, 2023 FINDINGS: Brain: The  ventricles appear age appropriate. No mass effect or midline shift. Gray-white differentiation is preserved without focal attenuation abnormality.No evidence of acute territorial infarction, extra-axial fluid collection, hemorrhage, or mass lesion. The basilar cisterns are patent without downward herniation. The cerebellar hemispheres and vermis are well formed without mass lesion or focal attenuation abnormality. Vascular: No hyperdense vessel. Skull: Normal. Negative for fracture or focal lesion. Sinuses/Orbits: The paranasal sinuses and mastoids are clear. The globes appear intact. No retrobulbar hematoma. Other: None. IMPRESSION: No acute intracranial abnormality, specifically, no acute hemorrhage, territorial infarction, or intracranial mass. Electronically Signed   By: Rance Burrows M.D.   On: 06/05/2023 19:04   CT ABDOMEN PELVIS W CONTRAST Result Date: 06/05/2023 CLINICAL DATA:  Abdominal  pain.  Vomiting. EXAM: CT ABDOMEN AND PELVIS WITH CONTRAST TECHNIQUE: Multidetector CT imaging of the abdomen and pelvis was performed using the standard protocol following bolus administration of intravenous contrast. RADIATION DOSE REDUCTION: This exam was performed according to the departmental dose-optimization program which includes automated exposure control, adjustment of the mA and/or kV according to patient size and/or use of iterative reconstruction technique. CONTRAST:  OMNIPAQUE  IOHEXOL  300 MG/ML  SOLN COMPARISON:  07/27/2022. FINDINGS: Lower chest: No acute abnormality. Hepatobiliary: No focal liver abnormality is seen. No gallstones, gallbladder wall thickening, or biliary dilatation. Pancreas: Unremarkable. No pancreatic ductal dilatation or surrounding inflammatory changes. Spleen: Normal in size without focal abnormality. Adrenals/Urinary Tract: Stable 13 mm left adrenal nodule, unchanged since 08/26/2019, most compatible with a benign adenoma, for which no follow-up imaging is necessary. Right  adrenal gland is unremarkable. No suspicious focal lesion. No urolithiasis or hydronephrosis. Bladder is unremarkable. Stomach/Bowel: Stomach is within normal limits. A few mildly distended fluid-filled loops of mid to distal small bowel. No evidence of obstruction or inflammatory changes. Appendix appears normal. Vascular/Lymphatic: No significant vascular findings are present. No enlarged abdominal or pelvic lymph nodes. Reproductive: Uterus and bilateral adnexa are unremarkable. Other: No abdominopelvic ascites.  No free air. Musculoskeletal: No acute or significant osseous findings. IMPRESSION: No acute localizing findings in the abdomen or pelvis. Electronically Signed   By: Mannie Seek M.D.   On: 06/05/2023 18:53      IMPRESSION AND PLAN:  Assessment and Plan: * Intractable nausea and vomiting - The patient be admitted to an observation medical telemetry bed. - She be placed on schedule IV Reglan . - Will continue hydration with IV normal saline with added potassium chloride  given her hypokalemia. - Will check magnesium  level. - Will place on clear liquids and advance the diet as tolerated.  Diabetic gastroparesis associated with type 2 diabetes mellitus (HCC) - Dislike the culprit for a #1. - Will place the patient on supplemental coverage with NovoLog . - Will continue basal coverage.  Hyperthyroidism - Will continue methimazole  and propranolol ..  Hypokalemia - Potassium will be replaced and magnesium  level will be checked.  Anxiety and depression - Will continue Seroquel .   DVT prophylaxis: Lovenox .  Advanced Care Planning:  Code Status: full code.  Family Communication:  The plan of care was discussed in details with the patient (and family). I answered all questions. The patient agreed to proceed with the above mentioned plan. Further management will depend upon hospital course. Disposition Plan: Back to previous home environment Consults called: none.  All the  records are reviewed and case discussed with ED provider.  Status is: Observation  I certify that at the time of admission, it is my clinical judgment that the patient will require hospital care extending less than 2 midnights.                            Dispo: The patient is from: Home              Anticipated d/c is to: Home              Patient currently is not medically stable to d/c.              Difficult to place patient: No  Virgene Griffin M.D on 06/05/2023 at 11:38 PM  [facility]   From 7 PM-7 AM, contact night-coverage [email or web address]  CC: Primary care physician; Ramji, Amrita, FNP

## 2023-06-05 NOTE — ED Notes (Addendum)
 Pt begins to have the full body jerking and not responding to noxious stimuli. MD made aware. Pt now has spontaneous eye opening and answering questions appropriately. MD at bedside.

## 2023-06-05 NOTE — Assessment & Plan Note (Signed)
-   The patient be admitted to an observation medical telemetry bed. - She be placed on schedule IV Reglan . - Will continue hydration with IV normal saline with added potassium chloride  given her hypokalemia. - Will check magnesium  level. - Will place on clear liquids and advance the diet as tolerated.

## 2023-06-05 NOTE — Assessment & Plan Note (Signed)
-   Dislike the culprit for a #1. - Will place the patient on supplemental coverage with NovoLog . - Will continue basal coverage.

## 2023-06-05 NOTE — ED Notes (Signed)
 Pt bed alarm going off- this tech to bedside to find pt rocking back and forth in bed, once this tech got to bedside, pt stops moving and states "its hot" and then lays her head back down. RN Johnnette Nakayama made aware.

## 2023-06-05 NOTE — Assessment & Plan Note (Signed)
-   Potassium will be replaced and magnesium level will be checked. ?

## 2023-06-06 DIAGNOSIS — R112 Nausea with vomiting, unspecified: Secondary | ICD-10-CM | POA: Diagnosis not present

## 2023-06-06 LAB — BASIC METABOLIC PANEL WITH GFR
Anion gap: 6 (ref 5–15)
BUN: 9 mg/dL (ref 6–20)
CO2: 24 mmol/L (ref 22–32)
Calcium: 8.6 mg/dL — ABNORMAL LOW (ref 8.9–10.3)
Chloride: 110 mmol/L (ref 98–111)
Creatinine, Ser: 0.31 mg/dL — ABNORMAL LOW (ref 0.44–1.00)
GFR, Estimated: >60 mL/min (ref >60)
Glucose, Bld: 118 mg/dL — ABNORMAL HIGH (ref 70–99)
Potassium: 3.8 mmol/L (ref 3.5–5.1)
Sodium: 140 mmol/L (ref 135–145)

## 2023-06-06 LAB — MAGNESIUM: Magnesium: 1.6 mg/dL — ABNORMAL LOW (ref 1.7–2.4)

## 2023-06-06 LAB — CBC
HCT: 32.2 % — ABNORMAL LOW (ref 36.0–46.0)
Hemoglobin: 10.3 g/dL — ABNORMAL LOW (ref 12.0–15.0)
MCH: 24.2 pg — ABNORMAL LOW (ref 26.0–34.0)
MCHC: 32 g/dL (ref 30.0–36.0)
MCV: 75.6 fL — ABNORMAL LOW (ref 80.0–100.0)
Platelets: 263 10*3/uL (ref 150–400)
RBC: 4.26 MIL/uL (ref 3.87–5.11)
RDW: 14.6 % (ref 11.5–15.5)
WBC: 7.9 10*3/uL (ref 4.0–10.5)
nRBC: 0 % (ref 0.0–0.2)

## 2023-06-06 LAB — GLUCOSE, CAPILLARY
Glucose-Capillary: 124 mg/dL — ABNORMAL HIGH (ref 70–99)
Glucose-Capillary: 218 mg/dL — ABNORMAL HIGH (ref 70–99)

## 2023-06-06 MED ORDER — INSULIN ASPART 100 UNIT/ML IJ SOLN
0.0000 [IU] | Freq: Three times a day (TID) | INTRAMUSCULAR | Status: DC
  Administered 2023-06-06: 5 [IU] via SUBCUTANEOUS
  Filled 2023-06-06: qty 1

## 2023-06-06 MED ORDER — INSULIN ASPART 100 UNIT/ML IJ SOLN: 0.0000 [IU] | Freq: Every day | INTRAMUSCULAR | Status: DC

## 2023-06-06 MED ORDER — METOCLOPRAMIDE HCL 10 MG PO TABS: 10.0000 mg | ORAL_TABLET | Freq: Three times a day (TID) | ORAL | 0 refills | Status: AC

## 2023-06-06 MED ORDER — INSULIN ASPART 100 UNIT/ML IJ SOLN: 0.0000 [IU] | Freq: Three times a day (TID) | INTRAMUSCULAR | Status: DC

## 2023-06-06 MED ORDER — METOCLOPRAMIDE HCL 10 MG PO TABS
20.0000 mg | ORAL_TABLET | Freq: Three times a day (TID) | ORAL | Status: DC
  Administered 2023-06-06: 20 mg via ORAL
  Filled 2023-06-06 (×3): qty 2

## 2023-06-06 MED ORDER — QUETIAPINE FUMARATE 100 MG PO TABS: 100.0000 mg | ORAL_TABLET | Freq: Every evening | ORAL | Status: AC | PRN

## 2023-06-06 NOTE — Progress Notes (Incomplete)
 PROGRESS NOTE    Nancy [name]   ZOX:[id number] DOB: [birth year]  DOA: 06/05/2023 Date of Service: 06/06/23 which is hospital day 0  PCP: Zoila Hines, FNP    Hospital course / significant events:   HPI: ***  05/30: to ED, tachycardic/tachypneic (SIRS), mild hypokalemia, given IV fluids and antiemetics, also IV keppra , admitted to hospitalist  05/31: metabolic abn have normalized, *** po intake,      Consultants:  ***  Procedures/Surgeries: ***      ASSESSMENT & PLAN:   Intractable nausea and vomiting - The patient be admitted to an observation medical telemetry bed. - She be placed on schedule IV Reglan . - Will continue hydration with IV normal saline with added potassium chloride  given her hypokalemia. - Will check magnesium  level. - Will place on clear liquids and advance the diet as tolerated.   Diabetic gastroparesis associated with type 2 diabetes mellitus (HCC) - Dislike the culprit for a #1. - Will place the patient on supplemental coverage with NovoLog . - Will continue basal coverage.   Hyperthyroidism - Will continue methimazole  and propranolol ..   Hypokalemia - Potassium will be replaced and magnesium  level will be checked.   Anxiety and depression - Will continue Seroquel .      *** based on BMI: Body mass index is 30.46 kg/m.[name] Significantly low or high BMI is associated with higher medical risk.  Underweight - under 18  overweight - 25 to 29 obese - 30 or more Class 1 obesity: BMI of 30.0 to 34 Class 2 obesity: BMI of 35.0 to 39 Class 3 obesity: BMI of 40.0 to 49 Super Morbid Obesity: BMI 50-59 Super-super Morbid Obesity: BMI 60+ Healthy nutrition and physical activity advised as adjunct to other disease management and risk reduction treatments    DVT prophylaxis: *** IV fluids: *** continuous IV fluids  Nutrition: *** Central lines / other devices: ***  Code Status: *** ACP documentation reviewed: *** none on file in  [facility]  TOC needs: *** Medical barriers to dispo: ***. Expected medical readiness for discharge ***.              Subjective / Brief ROS:  Patient reports *** Denies CP/SOB.  Pain controlled.  Denies new weakness.  Tolerating diet ***.  Reports no concerns w/ urination/defecation.   Family Communication: ***    Objective Findings:  Vitals:   06/05/23 2230 06/05/23 2321 06/06/23 0116 06/06/23 0521  BP: 116/67 (!) 143/95 (!) 103/57 (!) 102/55  Pulse: (!) 108 (!) 114 87 82  Resp: 16 20 18 17   Temp:  97.7 F (36.5 C) 98.2 F (36.8 C) 97.6 F (36.4 C)  TempSrc:  Oral  Oral  SpO2: 99% 100% 98% 100%  Weight:      Height:        Intake/Output Summary (Last 24 hours) at 06/06/2023 0752 Last data filed at 06/06/2023 0300 Gross per 24 hour  Intake 437.31 ml  Output --  Net 437.31 ml   Filed Weights   06/05/23 1514  Weight: 78 kg    Examination:  Physical Exam       Scheduled Medications:   enoxaparin  (LOVENOX ) injection  40 mg Subcutaneous Q24H   insulin  glargine-yfgn  8 Units Subcutaneous BID   methimazole   20 mg Oral BID   metoCLOPramide   20 mg Oral TID AC & HS   propranolol   80 mg Oral TID   QUEtiapine   100 mg Oral QHS    Continuous Infusions:  0.9 % NaCl with  KCl 20 mEq / L 100 mL/hr at 06/05/23 2237    PRN Medications:  diphenoxylate -atropine , magnesium  hydroxide, ondansetron  **OR** ondansetron  (ZOFRAN ) IV, traZODone   Antimicrobials from admission:  Anti-infectives (From admission, onward)    None           Data Reviewed:  I have personally reviewed the following...  CBC: Recent Labs  Lab 06/05/23 1518 06/06/23 0424  WBC 6.0 7.9  NEUTROABS 2.8  --   HGB 12.2 10.3*  HCT 37.9 32.2*  MCV 75.8* 75.6*  PLT 330 263   Basic Metabolic Panel: Recent Labs  Lab 06/05/23 1518 06/06/23 0424  NA 138 140  K 3.1* 3.8  CL 102 110  CO2 22 24  GLUCOSE 177* 118*  BUN 12 9  CREATININE 0.51 0.31*  CALCIUM  9.2 8.6*  MG 1.6*  --     GFR: Estimated Creatinine Clearance: 107.2 mL/min (A) (by C-G formula based on SCr of 0.31 mg/dL (L)). Liver Function Tests: Recent Labs  Lab 06/05/23 1518  AST 31  ALT 31  ALKPHOS 82  BILITOT 1.2  PROT 7.4  ALBUMIN 4.2   No results for input(s): "LIPASE", "AMYLASE" in the last 168 hours. No results for input(s): "AMMONIA" in the last 168 hours. Coagulation Profile: No results for input(s): "INR", "PROTIME" in the last 168 hours. Cardiac Enzymes: No results for input(s): "CKTOTAL", "CKMB", "CKMBINDEX", "TROPONINI" in the last 168 hours. BNP (last 3 results) No results for input(s): "PROBNP" in the last 8760 hours. HbA1C: No results for input(s): "HGBA1C" in the last 72 hours. CBG: Recent Labs  Lab 06/05/23 2312  GLUCAP 103*   Lipid Profile: No results for input(s): "CHOL", "HDL", "LDLCALC", "TRIG", "CHOLHDL", "LDLDIRECT" in the last 72 hours. Thyroid  Function Tests: No results for input(s): "TSH", "T4TOTAL", "FREET4", "T3FREE", "THYROIDAB" in the last 72 hours. Anemia Panel: No results for input(s): "VITAMINB12", "FOLATE", "FERRITIN", "TIBC", "IRON", "RETICCTPCT" in the last 72 hours. Most Recent Urinalysis On File:     Component Value Date/Time   COLORURINE YELLOW (A) 05/01/2023 1535   APPEARANCEUR CLEAR (A) 05/01/2023 1535   LABSPEC 1.029 05/01/2023 1535   PHURINE 5.0 05/01/2023 1535   GLUCOSEU 150 (A) 05/01/2023 1535   HGBUR NEGATIVE 05/01/2023 1535   BILIRUBINUR NEGATIVE 05/01/2023 1535   BILIRUBINUR negative 07/13/2018 1651   KETONESUR 20 (A) 05/01/2023 1535   PROTEINUR NEGATIVE 05/01/2023 1535   UROBILINOGEN 0.2 07/13/2018 1651   UROBILINOGEN 1.0 07/09/2017 1324   NITRITE NEGATIVE 05/01/2023 1535   LEUKOCYTESUR NEGATIVE 05/01/2023 1535   Sepsis Labs: @LABRCNTIP (procalcitonin:4,lacticidven:4) Microbiology: Recent Results (from the past 240 hours)  Resp panel by RT-PCR (RSV, Flu A&B, Covid) Anterior Nasal Swab     Status: None   Collection Time:  06/05/23  4:22 PM   Specimen: Anterior Nasal Swab  Result Value Ref Range Status   SARS Coronavirus 2 by RT PCR NEGATIVE NEGATIVE Final    Comment: (NOTE) SARS-CoV-2 target nucleic acids are NOT DETECTED.  The SARS-CoV-2 RNA is generally detectable in upper respiratory specimens during the acute phase of infection. The lowest concentration of SARS-CoV-2 viral copies this assay can detect is 138 copies/mL. A negative result does not preclude SARS-Cov-2 infection and should not be used as the sole basis for treatment or other patient management decisions. A negative result may occur with  improper specimen collection/handling, submission of specimen other than nasopharyngeal swab, presence of viral mutation(s) within the areas targeted by this assay, and inadequate number of viral copies(<138 copies/mL). A negative result must be  combined with clinical observations, patient history, and epidemiological information. The expected result is Negative.  Fact Sheet for Patients:  BloggerCourse.com  Fact Sheet for Healthcare Providers:  SeriousBroker.it  This test is no t yet approved or cleared by the [place]  FDA and  has been authorized for detection and/or diagnosis of SARS-CoV-2 by FDA under an Emergency Use Authorization (EUA). This EUA will remain  in effect (meaning this test can be used) for the duration of the COVID-19 declaration under Section 564(b)(1) of the Act, 21 U.S.C.section 360bbb-3(b)(1), unless the authorization is terminated  or revoked sooner.       Influenza A by PCR NEGATIVE NEGATIVE Final   Influenza B by PCR NEGATIVE NEGATIVE Final    Comment: (NOTE) The Xpert Xpress SARS-CoV-2/FLU/RSV plus assay is intended as an aid in the diagnosis of influenza from Nasopharyngeal swab specimens and should not be used as a sole basis for treatment. Nasal washings and aspirates are unacceptable for Xpert Xpress  SARS-CoV-2/FLU/RSV testing.  Fact Sheet for Patients: BloggerCourse.com  Fact Sheet for Healthcare Providers: SeriousBroker.it  This test is not yet approved or cleared by the [place]  FDA and has been authorized for detection and/or diagnosis of SARS-CoV-2 by FDA under an Emergency Use Authorization (EUA). This EUA will remain in effect (meaning this test can be used) for the duration of the COVID-19 declaration under Section 564(b)(1) of the Act, 21 U.S.C. section 360bbb-3(b)(1), unless the authorization is terminated or revoked.     Resp Syncytial Virus by PCR NEGATIVE NEGATIVE Final    Comment: (NOTE) Fact Sheet for Patients: BloggerCourse.com  Fact Sheet for Healthcare Providers: SeriousBroker.it  This test is not yet approved or cleared by the [place]  FDA and has been authorized for detection and/or diagnosis of SARS-CoV-2 by FDA under an Emergency Use Authorization (EUA). This EUA will remain in effect (meaning this test can be used) for the duration of the COVID-19 declaration under Section 564(b)(1) of the Act, 21 U.S.C. section 360bbb-3(b)(1), unless the authorization is terminated or revoked.  Performed at [facility], [address], [place], [place] [address]       Radiology Studies last 3 days: CT Head Wo Contrast Result Date: 06/05/2023 CLINICAL DATA:  seizures EXAM: CT HEAD WITHOUT CONTRAST TECHNIQUE: Contiguous axial images were obtained from the base of the skull through the vertex without intravenous contrast. RADIATION DOSE REDUCTION: This exam was performed according to the departmental dose-optimization program which includes automated exposure control, adjustment of the mA and/or kV according to patient size and/or use of iterative reconstruction technique. COMPARISON:  May 23, 2023, May 03, 2023 FINDINGS: Brain: The ventricles  appear age appropriate. No mass effect or midline shift. Gray-white differentiation is preserved without focal attenuation abnormality.No evidence of acute territorial infarction, extra-axial fluid collection, hemorrhage, or mass lesion. The basilar cisterns are patent without downward herniation. The cerebellar hemispheres and vermis are well formed without mass lesion or focal attenuation abnormality. Vascular: No hyperdense vessel. Skull: Normal. Negative for fracture or focal lesion. Sinuses/Orbits: The paranasal sinuses and mastoids are clear. The globes appear intact. No retrobulbar hematoma. Other: None. IMPRESSION: No acute intracranial abnormality, specifically, no acute hemorrhage, territorial infarction, or intracranial mass. Electronically Signed   By: Rance Burrows M.D.   On: 06/05/2023 19:04   CT ABDOMEN PELVIS W CONTRAST Result Date: 06/05/2023 CLINICAL DATA:  Abdominal pain.  Vomiting. EXAM: CT ABDOMEN AND PELVIS WITH CONTRAST TECHNIQUE: Multidetector CT imaging of the abdomen and pelvis was performed using the  standard protocol following bolus administration of intravenous contrast. RADIATION DOSE REDUCTION: This exam was performed according to the departmental dose-optimization program which includes automated exposure control, adjustment of the mA and/or kV according to patient size and/or use of iterative reconstruction technique. CONTRAST:  OMNIPAQUE  IOHEXOL  300 MG/ML  SOLN COMPARISON:  07/27/2022. FINDINGS: Lower chest: No acute abnormality. Hepatobiliary: No focal liver abnormality is seen. No gallstones, gallbladder wall thickening, or biliary dilatation. Pancreas: Unremarkable. No pancreatic ductal dilatation or surrounding inflammatory changes. Spleen: Normal in size without focal abnormality. Adrenals/Urinary Tract: Stable 13 mm left adrenal nodule, unchanged since 08/26/2019, most compatible with a benign adenoma, for which no follow-up imaging is necessary. Right adrenal gland  is unremarkable. No suspicious focal lesion. No urolithiasis or hydronephrosis. Bladder is unremarkable. Stomach/Bowel: Stomach is within normal limits. A few mildly distended fluid-filled loops of mid to distal small bowel. No evidence of obstruction or inflammatory changes. Appendix appears normal. Vascular/Lymphatic: No significant vascular findings are present. No enlarged abdominal or pelvic lymph nodes. Reproductive: Uterus and bilateral adnexa are unremarkable. Other: No abdominopelvic ascites.  No free air. Musculoskeletal: No acute or significant osseous findings. IMPRESSION: No acute localizing findings in the abdomen or pelvis. Electronically Signed   By: Mannie Seek M.D.   On: 06/05/2023 18:53       Time spent: ***    Akshay Spang, DO [facility] 06/06/2023, 7:52 AM    Dictation software may have been used to generate the above note. Typos may occur and escape review in typed/dictated notes. Please contact Dr Authur Leghorn directly for clarity if needed.  Staff may message me via secure chat in Epic  but this may not receive an immediate response,  please page me for urgent matters!  If 7PM-7AM, please contact night coverage [email or web address]

## 2023-06-06 NOTE — Hospital Course (Signed)
 Hospital course / significant events:   HPI: ***  05/30: to ED, tachycardic/tachypneic (SIRS), mild hypokalemia, given IV fluids and antiemetics, also IV keppra , admitted to hospitalist  05/31: metabolic abn have normalized, *** po intake,      Consultants:  ***  Procedures/Surgeries: ***      ASSESSMENT & PLAN:   Intractable nausea and vomiting - The patient be admitted to an observation medical telemetry bed. - She be placed on schedule IV Reglan . - Will continue hydration with IV normal saline with added potassium chloride  given her hypokalemia. - Will check magnesium  level. - Will place on clear liquids and advance the diet as tolerated.   Diabetic gastroparesis associated with type 2 diabetes mellitus (HCC) - Dislike the culprit for a #1. - Will place the patient on supplemental coverage with NovoLog . - Will continue basal coverage.   Hyperthyroidism - Will continue methimazole  and propranolol ..   Hypokalemia - Potassium will be replaced and magnesium  level will be checked.   Anxiety and depression - Will continue Seroquel .      *** based on BMI: Body mass index is 30.46 kg/m.[name] Significantly low or high BMI is associated with higher medical risk.  Underweight - under 18  overweight - 25 to 29 obese - 30 or more Class 1 obesity: BMI of 30.0 to 34 Class 2 obesity: BMI of 35.0 to 39 Class 3 obesity: BMI of 40.0 to 49 Super Morbid Obesity: BMI 50-59 Super-super Morbid Obesity: BMI 60+ Healthy nutrition and physical activity advised as adjunct to other disease management and risk reduction treatments    DVT prophylaxis: *** IV fluids: *** continuous IV fluids  Nutrition: *** Central lines / other devices: ***  Code Status: *** ACP documentation reviewed: *** none on file in [facility]  TOC needs: *** Medical barriers to dispo: ***. Expected medical readiness for discharge ***.

## 2023-06-06 NOTE — TOC Transition Note (Signed)
 Transition of Care [facility]) - Discharge Note   Patient Details  Name: Nancy [name] MRN: [id number] Date of Birth: 19-Jun-2000  Transition of Care [facility]) CM/SW Contact:  Alexandra Ice, RN Phone Number: 06/06/2023, 1:28 PM   Clinical Narrative:    Patient to discharge today, home. Received message from bedside nurse patient is needing assistance with transportation. TOC provided cab voucher for patient to return home.    Final next level of care: Home/Self Care Barriers to Discharge: Barriers Resolved   Patient Goals and CMS Choice            Discharge Placement                Patient to be transferred to facility by: Cab services   Patient and family notified of of transfer: 06/06/23  Discharge Plan and Services Additional resources added to the After Visit Summary for                    DME Agency: NA       HH Arranged: NA          Social Drivers of Health (SDOH) Interventions SDOH Screenings   Food Insecurity: No Food Insecurity (06/05/2023)  Housing: Low Risk  (06/05/2023)  Transportation Needs: No Transportation Needs (06/05/2023)  Utilities: Not At Risk (06/05/2023)  Alcohol Screen: Low Risk  (11/06/2018)  Depression (PHQ2-9): Low Risk  (12/06/2018)  Recent Concern: Depression (PHQ2-9) - Medium Risk (10/30/2018)  Financial Resource Strain: Medium Risk (06/13/2022)   Received from [facility]  Social Connections: Unknown (05/24/2023)  Tobacco Use: High Risk (06/05/2023)     Readmission Risk Interventions    07/28/2022   12:56 PM 08/22/2021   10:32 AM  Readmission Risk Prevention Plan  Transportation Screening Complete Complete  Medication Review (RN Care Manager) Complete Complete  PCP or Specialist appointment within 3-5 days of discharge Complete Complete  HRI or Home Care Consult Complete   SW Recovery Care/Counseling Consult Complete Complete  Palliative Care Screening Not Applicable Not Applicable  Skilled Nursing  Facility Not Applicable Not Applicable

## 2023-06-06 NOTE — TOC Initial Note (Signed)
 Transition of Care [facility]) - Initial/Assessment Note    Patient Details  Name: Nancy [name] MRN: [id number] Date of Birth: 2000/10/19  [facility]) CM/SW Contact:    Alexandra Ice, RN Phone Number: 06/06/2023, 8:24 AM  Clinical Narrative:                 Patient lives with mother. Independent with ADLs. She has PCP in the community. No TOC needs identified at this time, will continue to follow.         Patient Goals and CMS Choice            Expected Discharge Plan and Services                                              Prior Living Arrangements/Services                       Activities of Daily Living   ADL Screening (condition at time of admission) Independently performs ADLs?: Yes (appropriate for developmental age) Is the patient deaf or have difficulty hearing?: No Does the patient have difficulty seeing, even when wearing glasses/contacts?: No Does the patient have difficulty concentrating, remembering, or making decisions?: No  Permission Sought/Granted                  Emotional Assessment              Admission diagnosis:  Intractable nausea and vomiting [R11.2] Patient Active Problem List   Diagnosis Date Noted   Diabetic gastroparesis associated with type 2 diabetes mellitus (HCC) 06/05/2023   Anxiety and depression 06/05/2023   Musculoskeletal back pain 05/23/2023   Tarry stool 05/23/2023   Breakthrough seizure (HCC) 05/04/2023   Thyroid  storm 04/30/2023   Obesity, 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   Intractable vomiting with nausea 03/06/2023   History of psychogenic nonepileptic seizure 03/06/2023   T2DM (type 2 diabetes mellitus) (HCC) 03/06/2023   Seizure (HCC) 07/27/2022   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   Thyrotoxicosis 12/31/2021   Seizure disorder (HCC)    Hyperthyroidism 10/27/2021   Thyroid  crisis or storm 08/20/2021   Microcytic anemia 02/12/2021   Left inguinal abscess 02/12/2021    Asymptomatic bacteriuria 02/12/2021   Nicotine  dependence 02/12/2021   Abdominal pain 02/22/2020   Chronic constipation 02/22/2020   Nausea & vomiting 02/22/2020   SIRS (systemic inflammatory response syndrome) (HCC) 01/14/2020   Diabetic gastroparesis (HCC) 11/25/2019   Polysubstance abuse (HCC) 11/25/2019   Gastritis 11/25/2019   DKA, type 1 (HCC) 11/24/2019   Cyclical vomiting with nausea 10/01/2019   Intractable nausea and vomiting 09/30/2019   Cellulitis    DKA (diabetic ketoacidosis) (HCC) 01/27/2019   Leukocytosis 12/19/2018   Dehydration    Psychogenic nonepileptic seizure    Type 1 diabetes mellitus with ketoacidosis without coma (HCC) 11/28/2018   Hyperglycemia 11/27/2018   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   MDD (major depressive disorder), recurrent episode, severe (HCC) 11/06/2018   Nonspecific abnormal electrocardiogram (ECG) (EKG)    Chest pain of uncertain etiology    Hypertensive urgency 10/08/2018   Conversion disorder with attacks or seizures, acute episode, with psychological stressor 09/16/2018   MDD (major depressive disorder), recurrent severe, without psychosis (HCC)    Hypokalemia 08/04/2018   AKI (acute kidney injury) (HCC) 08/04/2018   Seizure-like activity (  HCC) 08/03/2018   Depression 07/25/2018   Syncope 01/30/2018   Orthostatic hypotension 01/24/2018   Tachycardia 12/28/2017   Chronic pain 12/24/2017   Chest pain 12/19/2017   Nausea and vomiting 08/21/2017   Generalized abdominal pain 08/21/2017   Non compliance with medical treatment 01/27/2012   Adjustment disorder 09/16/2011   Acanthosis nigricans    Goiter    Obesity 06/14/2010   Hypertension associated with diabetes (HCC) 06/14/2010   PCP:  Ramji, Amrita, FNP Pharmacy:   [phone] - [place], [place] - [address] [place] [address] Phone: [phone] Fax: [phone]     Social Drivers of Health (SDOH) Social  History: SDOH Screenings   Food Insecurity: No Food Insecurity (06/05/2023)  Housing: Low Risk  (06/05/2023)  Transportation Needs: No Transportation Needs (06/05/2023)  Utilities: Not At Risk (06/05/2023)  Alcohol Screen: Low Risk  (11/06/2018)  Depression (PHQ2-9): Low Risk  (12/06/2018)  Recent Concern: Depression (PHQ2-9) - Medium Risk (10/30/2018)  Financial Resource Strain: Medium Risk (06/13/2022)   Received from [facility]  Social Connections: Unknown (05/24/2023)  Tobacco Use: High Risk (06/05/2023)   SDOH Interventions:     Readmission Risk Interventions    07/28/2022   12:56 PM 08/22/2021   10:32 AM  Readmission Risk Prevention Plan  Transportation Screening Complete Complete  Medication Review (RN Care Manager) Complete Complete  PCP or Specialist appointment within 3-5 days of discharge Complete Complete  HRI or Home Care Consult Complete   SW Recovery Care/Counseling Consult Complete Complete  Palliative Care Screening Not Applicable Not Applicable  Skilled Nursing Facility Not Applicable Not Applicable

## 2023-06-06 NOTE — Plan of Care (Signed)

## 2023-06-06 NOTE — Discharge Summary (Signed)
 Physician Discharge Summary   Patient: Nancy [name] MRN: [id number]  DOB: Dec 25, 2000   Admit:     Date of Admission: 06/05/2023 Admitted from: home   Discharge: Date of discharge: 06/06/23 Disposition: Home Condition at discharge: good  CODE STATUS: FULL CODE     Discharge Physician: Melodi Sprung, DO [facility]     PCP: Zoila Hines, FNP  Recommendations for Outpatient Follow-up:  Follow up with PCP Ramji, Amrita, FNP in 1-2 weeks    Discharge Instructions     Diet Carb Modified   Complete by: As directed    Increase activity slowly   Complete by: As directed          Discharge Diagnoses: Principal Problem:   Intractable nausea and vomiting Active Problems:   Diabetic gastroparesis associated with type 2 diabetes mellitus (HCC)   Hypokalemia   Hyperthyroidism   Anxiety and depression       Hospital course / significant events:   HPI: Nancy [name] is a 23 y.o. African-American female with medical history significant for seizures, CHF, anxiety, depression and type 2 diabetes mellitus, who presented to the emergency room with acute onset of intractable nonbilious and nonbloody nausea and vomiting over the last 3 days.  She felt slightly better yesterday however symptoms recurred this afternoon.  She denied any diarrhea.  She admitted to generalized abdominal pain with her symptoms.   05/30: to ED, tachycardic/tachypneic (SIRS), mild hypokalemia, given IV fluids and antiemetics, also IV keppra , admitted to hospitalist  05/31: metabolic abn have normalized, tolerating solid po intake,      Consultants:  none  Procedures/Surgeries: none      ASSESSMENT & PLAN:   Intractable nausea and vomiting Pt reports reglan  works well - 30 days supply Rx sent, f/u w/ PCP/GI   Diabetic gastroparesis associated with type 2 diabetes mellitus (HCC) Resume home regimen   Hyperthyroidism continue methimazole  and propranolol ..    Hypokalemia - resolved BMP prn outpatient   Anxiety and depression Pt takes Seroquel  as needed for insomnia       Class 1 obesity based on BMI: Body mass index is 30.46 kg/m.[name] Significantly low or high BMI is associated with higher medical risk.  Underweight - under 18  overweight - 25 to 29 obese - 30 or more Class 1 obesity: BMI of 30.0 to 34 Class 2 obesity: BMI of 35.0 to 39 Class 3 obesity: BMI of 40.0 to 49 Super Morbid Obesity: BMI 50-59 Super-super Morbid Obesity: BMI 60+ Healthy nutrition and physical activity advised as adjunct to other disease management and risk reduction treatments       Discharge Instructions  Allergies as of 06/06/2023       Reactions   Oatmeal Anaphylaxis   Tomato Anaphylaxis   Acetaminophen  Other (See Comments)   Avoids due to liver   Ibuprofen  Other (See Comments)   GI MD said to not take this anymore        Medication List     TAKE these medications    Baqsimi  Two Pack 3 MG/DOSE Powd Generic drug: Glucagon  Place 1 spray into the nose as needed.   diphenoxylate -atropine  2.5-0.025 MG tablet Commonly known as: LOMOTIL  Take 1 tablet by mouth 4 (four) times daily as needed for diarrhea or loose stools.   Insulin  Aspart FlexPen 100 UNIT/ML Commonly known as: NOVOLOG  Inject 15 Units into the skin as directed. Sliding scale insulin  as follows: BG 150-200  = 2 units BG  200-250 =  4 units BG  250-300 = 6 units BG  300-350 = 8 units BG  350-400 = 10 units   insulin  glargine 100 unit/mL Sopn Commonly known as: LANTUS  Inject 8 Units into the skin 2 (two) times daily.   methimazole  10 MG tablet Commonly known as: TAPAZOLE  Take 2 tablets (20 mg total) by mouth 2 (two) times daily.   metoCLOPramide  10 MG tablet Commonly known as: REGLAN  Take 1 tablet (10 mg total) by mouth 3 (three) times daily before meals.   propranolol  80 MG tablet Commonly known as: INDERAL  Take 1 tablet (80 mg total) by mouth 3 (three) times  daily.   QUEtiapine  100 MG tablet Commonly known as: SEROQUEL  Take 1 tablet (100 mg total) by mouth at bedtime as needed (insomnia). What changed: See the new instructions.         Follow-up Information     Ramji, Amrita, FNP Follow up.   Specialty: Family Medicine Why: Hospital follow up Contact information: [address] [facility] [address] [place] [address] [phone]                 Allergies  Allergen Reactions   Oatmeal Anaphylaxis   Tomato Anaphylaxis   Acetaminophen  Other (See Comments)    Avoids due to liver   Ibuprofen  Other (See Comments)    GI MD said to not take this anymore     Subjective: pt reports tolerting solid po intake for lunch , abd pain is improved, pt eager for discharge home   Discharge Exam: BP 112/67 (BP Location: Left Arm)   Pulse (!) 101   Temp 98.6 F (37 C) (Oral)   Resp 18   Ht 5\' 3"  (1.6 m)   Wt 78 kg   LMP 05/07/2023   SpO2 100%   BMI 30.46 kg/m  General: Pt is alert, awake, not in acute distress Cardiovascular: RRR, S1/S2 +, no rubs, no gallops Respiratory: CTA bilaterally, no wheezing, no rhonchi Abdominal: Soft, NT, ND, bowel sounds + Extremities: no edema, no cyanosis     The results of significant diagnostics from this hospitalization (including imaging, microbiology, ancillary and laboratory) are listed below for reference.     Microbiology: Recent Results (from the past 240 hours)  Resp panel by RT-PCR (RSV, Flu A&B, Covid) Anterior Nasal Swab     Status: None   Collection Time: 06/05/23  4:22 PM   Specimen: Anterior Nasal Swab  Result Value Ref Range Status   SARS Coronavirus 2 by RT PCR NEGATIVE NEGATIVE Final    Comment: (NOTE) SARS-CoV-2 target nucleic acids are NOT DETECTED.  The SARS-CoV-2 RNA is generally detectable in upper respiratory specimens during the acute phase of infection. The lowest concentration of SARS-CoV-2 viral copies this assay can detect is 138 copies/mL. A negative  result does not preclude SARS-Cov-2 infection and should not be used as the sole basis for treatment or other patient management decisions. A negative result may occur with  improper specimen collection/handling, submission of specimen other than nasopharyngeal swab, presence of viral mutation(s) within the areas targeted by this assay, and inadequate number of viral copies(<138 copies/mL). A negative result must be combined with clinical observations, patient history, and epidemiological information. The expected result is Negative.  Fact Sheet for Patients:  BloggerCourse.com  Fact Sheet for Healthcare Providers:  SeriousBroker.it  This test is no t yet approved or cleared by the [place]  FDA and  has been authorized for detection and/or diagnosis of SARS-CoV-2 by FDA under an  Emergency Use Authorization (EUA). This EUA will remain  in effect (meaning this test can be used) for the duration of the COVID-19 declaration under Section 564(b)(1) of the Act, 21 U.S.C.section 360bbb-3(b)(1), unless the authorization is terminated  or revoked sooner.       Influenza A by PCR NEGATIVE NEGATIVE Final   Influenza B by PCR NEGATIVE NEGATIVE Final    Comment: (NOTE) The Xpert Xpress SARS-CoV-2/FLU/RSV plus assay is intended as an aid in the diagnosis of influenza from Nasopharyngeal swab specimens and should not be used as a sole basis for treatment. Nasal washings and aspirates are unacceptable for Xpert Xpress SARS-CoV-2/FLU/RSV testing.  Fact Sheet for Patients: BloggerCourse.com  Fact Sheet for Healthcare Providers: SeriousBroker.it  This test is not yet approved or cleared by the [place]  FDA and has been authorized for detection and/or diagnosis of SARS-CoV-2 by FDA under an Emergency Use Authorization (EUA). This EUA will remain in effect (meaning this test can be used)  for the duration of the COVID-19 declaration under Section 564(b)(1) of the Act, 21 U.S.C. section 360bbb-3(b)(1), unless the authorization is terminated or revoked.     Resp Syncytial Virus by PCR NEGATIVE NEGATIVE Final    Comment: (NOTE) Fact Sheet for Patients: BloggerCourse.com  Fact Sheet for Healthcare Providers: SeriousBroker.it  This test is not yet approved or cleared by the [place]  FDA and has been authorized for detection and/or diagnosis of SARS-CoV-2 by FDA under an Emergency Use Authorization (EUA). This EUA will remain in effect (meaning this test can be used) for the duration of the COVID-19 declaration under Section 564(b)(1) of the Act, 21 U.S.C. section 360bbb-3(b)(1), unless the authorization is terminated or revoked.  Performed at [facility], [address] [place], [place] [address]      Labs: BNP (last 3 results) No results for input(s): "BNP" in the last 8760 hours. Basic Metabolic Panel: Recent Labs  Lab 06/05/23 1518 06/06/23 0424  NA 138 140  K 3.1* 3.8  CL 102 110  CO2 22 24  GLUCOSE 177* 118*  BUN 12 9  CREATININE 0.51 0.31*  CALCIUM  9.2 8.6*  MG 1.6*  --    Liver Function Tests: Recent Labs  Lab 06/05/23 1518  AST 31  ALT 31  ALKPHOS 82  BILITOT 1.2  PROT 7.4  ALBUMIN 4.2   No results for input(s): "LIPASE", "AMYLASE" in the last 168 hours. No results for input(s): "AMMONIA" in the last 168 hours. CBC: Recent Labs  Lab 06/05/23 1518 06/06/23 0424  WBC 6.0 7.9  NEUTROABS 2.8  --   HGB 12.2 10.3*  HCT 37.9 32.2*  MCV 75.8* 75.6*  PLT 330 263   Cardiac Enzymes: No results for input(s): "CKTOTAL", "CKMB", "CKMBINDEX", "TROPONINI" in the last 168 hours. BNP: Invalid input(s): "POCBNP" CBG: Recent Labs  Lab 06/05/23 2312 06/06/23 0820 06/06/23 1114  GLUCAP 103* 124* 218*   D-Dimer No results for input(s): "DDIMER" in the last 72 hours. Hgb  A1c No results for input(s): "HGBA1C" in the last 72 hours. Lipid Profile No results for input(s): "CHOL", "HDL", "LDLCALC", "TRIG", "CHOLHDL", "LDLDIRECT" in the last 72 hours. Thyroid  function studies No results for input(s): "TSH", "T4TOTAL", "T3FREE", "THYROIDAB" in the last 72 hours.  Invalid input(s): "FREET3" Anemia work up No results for input(s): "VITAMINB12", "FOLATE", "FERRITIN", "TIBC", "IRON", "RETICCTPCT" in the last 72 hours. Urinalysis    Component Value Date/Time   COLORURINE YELLOW (A) 05/01/2023 1535   APPEARANCEUR CLEAR (A) 05/01/2023 1535  LABSPEC 1.029 05/01/2023 1535   PHURINE 5.0 05/01/2023 1535   GLUCOSEU 150 (A) 05/01/2023 1535   HGBUR NEGATIVE 05/01/2023 1535   BILIRUBINUR NEGATIVE 05/01/2023 1535   BILIRUBINUR negative 07/13/2018 1651   KETONESUR 20 (A) 05/01/2023 1535   PROTEINUR NEGATIVE 05/01/2023 1535   UROBILINOGEN 0.2 07/13/2018 1651   UROBILINOGEN 1.0 07/09/2017 1324   NITRITE NEGATIVE 05/01/2023 1535   LEUKOCYTESUR NEGATIVE 05/01/2023 1535   Sepsis Labs Recent Labs  Lab 06/05/23 1518 06/06/23 0424  WBC 6.0 7.9   Microbiology Recent Results (from the past 240 hours)  Resp panel by RT-PCR (RSV, Flu A&B, Covid) Anterior Nasal Swab     Status: None   Collection Time: 06/05/23  4:22 PM   Specimen: Anterior Nasal Swab  Result Value Ref Range Status   SARS Coronavirus 2 by RT PCR NEGATIVE NEGATIVE Final    Comment: (NOTE) SARS-CoV-2 target nucleic acids are NOT DETECTED.  The SARS-CoV-2 RNA is generally detectable in upper respiratory specimens during the acute phase of infection. The lowest concentration of SARS-CoV-2 viral copies this assay can detect is 138 copies/mL. A negative result does not preclude SARS-Cov-2 infection and should not be used as the sole basis for treatment or other patient management decisions. A negative result may occur with  improper specimen collection/handling, submission of specimen other than  nasopharyngeal swab, presence of viral mutation(s) within the areas targeted by this assay, and inadequate number of viral copies(<138 copies/mL). A negative result must be combined with clinical observations, patient history, and epidemiological information. The expected result is Negative.  Fact Sheet for Patients:  BloggerCourse.com  Fact Sheet for Healthcare Providers:  SeriousBroker.it  This test is no t yet approved or cleared by the [place]  FDA and  has been authorized for detection and/or diagnosis of SARS-CoV-2 by FDA under an Emergency Use Authorization (EUA). This EUA will remain  in effect (meaning this test can be used) for the duration of the COVID-19 declaration under Section 564(b)(1) of the Act, 21 U.S.C.section 360bbb-3(b)(1), unless the authorization is terminated  or revoked sooner.       Influenza A by PCR NEGATIVE NEGATIVE Final   Influenza B by PCR NEGATIVE NEGATIVE Final    Comment: (NOTE) The Xpert Xpress SARS-CoV-2/FLU/RSV plus assay is intended as an aid in the diagnosis of influenza from Nasopharyngeal swab specimens and should not be used as a sole basis for treatment. Nasal washings and aspirates are unacceptable for Xpert Xpress SARS-CoV-2/FLU/RSV testing.  Fact Sheet for Patients: BloggerCourse.com  Fact Sheet for Healthcare Providers: SeriousBroker.it  This test is not yet approved or cleared by the [place]  FDA and has been authorized for detection and/or diagnosis of SARS-CoV-2 by FDA under an Emergency Use Authorization (EUA). This EUA will remain in effect (meaning this test can be used) for the duration of the COVID-19 declaration under Section 564(b)(1) of the Act, 21 U.S.C. section 360bbb-3(b)(1), unless the authorization is terminated or revoked.     Resp Syncytial Virus by PCR NEGATIVE NEGATIVE Final    Comment:  (NOTE) Fact Sheet for Patients: BloggerCourse.com  Fact Sheet for Healthcare Providers: SeriousBroker.it  This test is not yet approved or cleared by the [place]  FDA and has been authorized for detection and/or diagnosis of SARS-CoV-2 by FDA under an Emergency Use Authorization (EUA). This EUA will remain in effect (meaning this test can be used) for the duration of the COVID-19 declaration under Section 564(b)(1) of the Act, 21 U.S.C. section 360bbb-3(b)(1), unless the authorization  is terminated or revoked.  Performed at [facility], [address] [place], [place] [address]    Imaging CT Head Wo Contrast Result Date: 06/05/2023 CLINICAL DATA:  seizures EXAM: CT HEAD WITHOUT CONTRAST TECHNIQUE: Contiguous axial images were obtained from the base of the skull through the vertex without intravenous contrast. RADIATION DOSE REDUCTION: This exam was performed according to the departmental dose-optimization program which includes automated exposure control, adjustment of the mA and/or kV according to patient size and/or use of iterative reconstruction technique. COMPARISON:  May 23, 2023, May 03, 2023 FINDINGS: Brain: The ventricles appear age appropriate. No mass effect or midline shift. Gray-white differentiation is preserved without focal attenuation abnormality.No evidence of acute territorial infarction, extra-axial fluid collection, hemorrhage, or mass lesion. The basilar cisterns are patent without downward herniation. The cerebellar hemispheres and vermis are well formed without mass lesion or focal attenuation abnormality. Vascular: No hyperdense vessel. Skull: Normal. Negative for fracture or focal lesion. Sinuses/Orbits: The paranasal sinuses and mastoids are clear. The globes appear intact. No retrobulbar hematoma. Other: None. IMPRESSION: No acute intracranial abnormality, specifically, no acute hemorrhage,  territorial infarction, or intracranial mass. Electronically Signed   By: Rance Burrows M.D.   On: 06/05/2023 19:04   CT ABDOMEN PELVIS W CONTRAST Result Date: 06/05/2023 CLINICAL DATA:  Abdominal pain.  Vomiting. EXAM: CT ABDOMEN AND PELVIS WITH CONTRAST TECHNIQUE: Multidetector CT imaging of the abdomen and pelvis was performed using the standard protocol following bolus administration of intravenous contrast. RADIATION DOSE REDUCTION: This exam was performed according to the departmental dose-optimization program which includes automated exposure control, adjustment of the mA and/or kV according to patient size and/or use of iterative reconstruction technique. CONTRAST:  OMNIPAQUE  IOHEXOL  300 MG/ML  SOLN COMPARISON:  07/27/2022. FINDINGS: Lower chest: No acute abnormality. Hepatobiliary: No focal liver abnormality is seen. No gallstones, gallbladder wall thickening, or biliary dilatation. Pancreas: Unremarkable. No pancreatic ductal dilatation or surrounding inflammatory changes. Spleen: Normal in size without focal abnormality. Adrenals/Urinary Tract: Stable 13 mm left adrenal nodule, unchanged since 08/26/2019, most compatible with a benign adenoma, for which no follow-up imaging is necessary. Right adrenal gland is unremarkable. No suspicious focal lesion. No urolithiasis or hydronephrosis. Bladder is unremarkable. Stomach/Bowel: Stomach is within normal limits. A few mildly distended fluid-filled loops of mid to distal small bowel. No evidence of obstruction or inflammatory changes. Appendix appears normal. Vascular/Lymphatic: No significant vascular findings are present. No enlarged abdominal or pelvic lymph nodes. Reproductive: Uterus and bilateral adnexa are unremarkable. Other: No abdominopelvic ascites.  No free air. Musculoskeletal: No acute or significant osseous findings. IMPRESSION: No acute localizing findings in the abdomen or pelvis. Electronically Signed   By: Mannie Seek M.D.    On: 06/05/2023 18:53      Time coordinating discharge: over 30 minutes  SIGNED:  Lovelee Forner DO [facility]

## 2023-06-30 ENCOUNTER — Emergency Department

## 2023-06-30 ENCOUNTER — Encounter: Payer: Self-pay | Admitting: Emergency Medicine

## 2023-06-30 ENCOUNTER — Other Ambulatory Visit: Payer: Self-pay

## 2023-06-30 ENCOUNTER — Observation Stay
Admission: EM | Admit: 2023-06-30 | Discharge: 2023-07-02 | Disposition: A | Discharge status: Home / Self Care | Attending: Internal Medicine | Admitting: Internal Medicine

## 2023-06-30 DIAGNOSIS — R000 Tachycardia, unspecified: Secondary | ICD-10-CM | POA: Diagnosis not present

## 2023-06-30 DIAGNOSIS — E876 Hypokalemia: Secondary | ICD-10-CM | POA: Insufficient documentation

## 2023-06-30 DIAGNOSIS — Z8669 Personal history of other diseases of the nervous system and sense organs: Secondary | ICD-10-CM | POA: Diagnosis not present

## 2023-06-30 DIAGNOSIS — E66811 Obesity, class 1: Secondary | ICD-10-CM | POA: Insufficient documentation

## 2023-06-30 DIAGNOSIS — Z794 Long term (current) use of insulin: Secondary | ICD-10-CM | POA: Insufficient documentation

## 2023-06-30 DIAGNOSIS — E119 Type 2 diabetes mellitus without complications: Secondary | ICD-10-CM

## 2023-06-30 DIAGNOSIS — E0590 Thyrotoxicosis, unspecified without thyrotoxic crisis or storm: Secondary | ICD-10-CM | POA: Diagnosis not present

## 2023-06-30 DIAGNOSIS — R1010 Upper abdominal pain, unspecified: Principal | ICD-10-CM | POA: Insufficient documentation

## 2023-06-30 DIAGNOSIS — K3184 Gastroparesis: Secondary | ICD-10-CM

## 2023-06-30 DIAGNOSIS — R112 Nausea with vomiting, unspecified: Secondary | ICD-10-CM | POA: Diagnosis present

## 2023-06-30 DIAGNOSIS — K5909 Other constipation: Secondary | ICD-10-CM | POA: Insufficient documentation

## 2023-06-30 DIAGNOSIS — I509 Heart failure, unspecified: Secondary | ICD-10-CM | POA: Diagnosis not present

## 2023-06-30 DIAGNOSIS — Z6833 Body mass index (BMI) 33.0-33.9, adult: Secondary | ICD-10-CM | POA: Insufficient documentation

## 2023-06-30 DIAGNOSIS — F17290 Nicotine dependence, other tobacco product, uncomplicated: Secondary | ICD-10-CM | POA: Insufficient documentation

## 2023-06-30 DIAGNOSIS — E1110 Type 2 diabetes mellitus with ketoacidosis without coma: Secondary | ICD-10-CM | POA: Diagnosis not present

## 2023-06-30 DIAGNOSIS — Z87898 Personal history of other specified conditions: Secondary | ICD-10-CM

## 2023-06-30 DIAGNOSIS — E1143 Type 2 diabetes mellitus with diabetic autonomic (poly)neuropathy: Secondary | ICD-10-CM | POA: Diagnosis not present

## 2023-06-30 DIAGNOSIS — R1110 Vomiting, unspecified: Secondary | ICD-10-CM

## 2023-06-30 LAB — CBC WITH DIFFERENTIAL/PLATELET
Abs Immature Granulocytes: 0.01 10*3/uL (ref 0.00–0.07)
Basophils Absolute: 0 10*3/uL (ref 0.0–0.1)
Basophils Relative: 1 %
Eosinophils Absolute: 0 10*3/uL (ref 0.0–0.5)
Eosinophils Relative: 0 %
HCT: 36.3 % (ref 36.0–46.0)
Hemoglobin: 12.1 g/dL (ref 12.0–15.0)
Immature Granulocytes: 0 %
Lymphocytes Relative: 32 %
Lymphs Abs: 1.7 10*3/uL (ref 0.7–4.0)
MCH: 24.9 pg — ABNORMAL LOW (ref 26.0–34.0)
MCHC: 33.3 g/dL (ref 30.0–36.0)
MCV: 74.7 fL — ABNORMAL LOW (ref 80.0–100.0)
Monocytes Absolute: 0.4 10*3/uL (ref 0.1–1.0)
Monocytes Relative: 7 %
Neutro Abs: 3.2 10*3/uL (ref 1.7–7.7)
Neutrophils Relative %: 60 %
Platelets: 252 10*3/uL (ref 150–400)
RBC: 4.86 MIL/uL (ref 3.87–5.11)
RDW: 14.8 % (ref 11.5–15.5)
WBC: 5.3 10*3/uL (ref 4.0–10.5)
nRBC: 0 % (ref 0.0–0.2)

## 2023-06-30 LAB — COMPREHENSIVE METABOLIC PANEL WITH GFR
ALT: 22 U/L (ref 0–44)
AST: 35 U/L (ref 15–41)
Albumin: 4.4 g/dL (ref 3.5–5.0)
Alkaline Phosphatase: 85 U/L (ref 38–126)
Anion gap: 17 — ABNORMAL HIGH (ref 5–15)
BUN: 8 mg/dL (ref 6–20)
CO2: 19 mmol/L — ABNORMAL LOW (ref 22–32)
Calcium: 9.8 mg/dL (ref 8.9–10.3)
Chloride: 102 mmol/L (ref 98–111)
Creatinine, Ser: 0.45 mg/dL (ref 0.44–1.00)
GFR, Estimated: >60 mL/min (ref >60)
Glucose, Bld: 165 mg/dL — ABNORMAL HIGH (ref 70–99)
Potassium: 3 mmol/L — ABNORMAL LOW (ref 3.5–5.1)
Sodium: 138 mmol/L (ref 135–145)
Total Bilirubin: 1.3 mg/dL — ABNORMAL HIGH (ref 0.0–1.2)
Total Protein: 7.5 g/dL (ref 6.5–8.1)

## 2023-06-30 LAB — HCG, QUANTITATIVE, PREGNANCY: hCG, Beta Chain, Quant, S: <1 m[IU]/mL (ref <5)

## 2023-06-30 LAB — LIPASE, BLOOD: Lipase: 23 U/L (ref 11–51)

## 2023-06-30 MED ORDER — DIPHENHYDRAMINE HCL 50 MG/ML IJ SOLN
25.0000 mg | Freq: Once | INTRAMUSCULAR | Status: AC
  Administered 2023-06-30: 25 mg via INTRAVENOUS
  Filled 2023-06-30: qty 1

## 2023-06-30 MED ORDER — ONDANSETRON HCL 4 MG/2ML IJ SOLN
4.0000 mg | Freq: Once | INTRAMUSCULAR | Status: AC
  Administered 2023-06-30: 4 mg via INTRAVENOUS
  Filled 2023-06-30: qty 2

## 2023-06-30 MED ORDER — HALOPERIDOL LACTATE 5 MG/ML IJ SOLN
2.0000 mg | Freq: Once | INTRAMUSCULAR | Status: AC
  Administered 2023-06-30: 2 mg via INTRAVENOUS
  Filled 2023-06-30: qty 1

## 2023-06-30 MED ORDER — SODIUM CHLORIDE 0.9 % IV BOLUS
500.0000 mL | Freq: Once | INTRAVENOUS | Status: AC
  Administered 2023-06-30: 500 mL via INTRAVENOUS

## 2023-06-30 MED ORDER — MORPHINE SULFATE (PF) 4 MG/ML IV SOLN
4.0000 mg | Freq: Once | INTRAVENOUS | Status: AC
  Administered 2023-06-30: 4 mg via INTRAVENOUS
  Filled 2023-06-30: qty 1

## 2023-06-30 MED ORDER — POTASSIUM CHLORIDE 20 MEQ PO PACK
40.0000 meq | PACK | Freq: Two times a day (BID) | ORAL | Status: DC
  Administered 2023-06-30: 40 meq via ORAL
  Filled 2023-06-30: qty 2

## 2023-06-30 MED ORDER — METOCLOPRAMIDE HCL 5 MG/ML IJ SOLN
10.0000 mg | Freq: Once | INTRAMUSCULAR | Status: AC
  Administered 2023-06-30: 10 mg via INTRAVENOUS
  Filled 2023-06-30: qty 2

## 2023-06-30 MED ORDER — IOHEXOL 350 MG/ML SOLN
100.0000 mL | Freq: Once | INTRAVENOUS | Status: AC | PRN
  Administered 2023-06-30: 100 mL via INTRAVENOUS

## 2023-06-30 NOTE — ED Notes (Signed)
 Blanket and ice pack given per request

## 2023-06-30 NOTE — ED Provider Notes (Signed)
 [facility] Provider Note    Event Date/Time   First MD Initiated Contact with Patient 06/30/23 ARTEMUS     (approximate)   History   Seizures and Abdominal Pain  BIB ems from home   Ems called out for seizure like activity and abdominal pain.  Per ems when she has a seizure she will begin shaking and flopping around and once she comes out of it she will immediately begin talking with you and can tell you everything that has been said during the seizure. This happens when she has abdominal pain.  Pt complaining of abdominal pain during triage     HPI Nancy [name] is a 23 y.o. female PMH diabetes, diabetic gastroparesis, cyclic vomiting, seizure-like activity, conversion disorder, chronic generalized abdominal pain, CHF presents for evaluation of abdominal discomfort -I personally evaluated patient on 06/05/2023, admitted at that time for very similar presentation.  Unremarkable abdominal imaging on that date.  Per chart review, multiple prior presentations for similar. -Today, patient presents complaining of vague diffuse abdominal pain that appears to be worse in upper abdomen.  Contrary to triage note, she is not having any seizure-like activity.  She does do a rhythmic rocking movement when she has this type of pain which is similar to my last evaluation of her. -Otherwise limited historian but is conversing with me     Physical Exam   Triage Vital Signs: BP (!) 168/92   Pulse (!) 113   Temp 98.4 F (36.9 C) (Oral)   Resp 20   Ht 5' 3 (1.6 m)   Wt 85.7 kg   LMP 05/07/2023   SpO2 98%   BMI 33.48 kg/m     Most recent vital signs: Vitals:   06/30/23 1930 06/30/23 2130  BP:    Pulse: (!) 111 (!) 113  Resp:    Temp:    SpO2: 100% 98%     General: Awake, nontoxic-appearing but is moaning in discomfort CV:  Good peripheral perfusion. RRR, RP 2+ Resp:  Normal effort. CTAB Abd:  No distention.  Moderate tenderness throughout that  appears worse in epigastrium, nonperitoneal exam    ED Results / Procedures / Treatments   Labs (all labs ordered are listed, but only abnormal results are displayed) Labs Reviewed  COMPREHENSIVE METABOLIC PANEL WITH GFR - Abnormal; Notable for the following components:      Result Value   Potassium 3.0 (*)    CO2 19 (*)    Glucose, Bld 165 (*)    Total Bilirubin 1.3 (*)    Anion gap 17 (*)    All other components within normal limits  CBC WITH DIFFERENTIAL/PLATELET - Abnormal; Notable for the following components:   MCV 74.7 (*)    MCH 24.9 (*)    All other components within normal limits  LIPASE, BLOOD  HCG, QUANTITATIVE, PREGNANCY     EKG  Ecg = Sinus tachycardia, rate 127, no ST elevation or depression, no significant repolarization abnormality, normal axis, normal intervals.  No clear evidence of ischemia nor arrhythmia on my interpretation.   RADIOLOGY Etiology interpreted by myself and radiology report reviewed.  Negative for acute pathology.    PROCEDURES:  Critical Care performed: No  Procedures   MEDICATIONS ORDERED IN ED: Medications  potassium chloride  (KLOR-CON ) packet 40 mEq (40 mEq Oral Given 06/30/23 2207)  metoCLOPramide  (REGLAN ) injection 10 mg (10 mg Intravenous Given 06/30/23 1932)  diphenhydrAMINE  (BENADRYL ) injection 25 mg (25 mg Intravenous Given 06/30/23 1932)  morphine  (PF) 4 MG/ML injection 4 mg (4 mg Intravenous Given 06/30/23 1932)  sodium chloride  0.9 % bolus 500 mL (0 mLs Intravenous Stopped 06/30/23 2047)  haloperidol  lactate (HALDOL ) injection 2 mg (2 mg Intravenous Given 06/30/23 2049)  morphine  (PF) 4 MG/ML injection 4 mg (4 mg Intravenous Given 06/30/23 2316)  ondansetron  (ZOFRAN ) injection 4 mg (4 mg Intravenous Given 06/30/23 2316)  iohexol  (OMNIPAQUE ) 350 MG/ML injection 100 mL (100 mLs Intravenous Contrast Given 06/30/23 2318)     IMPRESSION / MDM / ASSESSMENT AND PLAN / ED COURSE  I reviewed the triage vital signs and the  nursing notes.                              DDX/MDM/AP: Differential diagnosis includes, but is not limited to, exacerbation of known gastroparesis/cyclic vomiting, consider but doubt acute underlying intra-abdominal pathology at this time--will screen labs, treat initial symptoms, and reassess.  Plan: - Labs - Reglan , Benadryl , morphine  - IV fluid - No indication for emergent imaging at this time - Reassess  Patient's presentation is most consistent with acute presentation with potential threat to life or bodily function.  The patient is on the cardiac monitor to evaluate for evidence of arrhythmia and/or significant heart rate changes.  ED course below.  Patient required multiple rounds of antiemetics and pain medications here but intermittently complains of severe worsening of pain.  Escalate to CT abdomen pelvis, unremarkable.  Signed out to oncoming ED provider pending reevaluation for ultimate disposition.  Overall suspect exacerbation of patient's known gastroparesis.  Clinical Course as of 07/01/23 0059  Tue Jun 30, 2023  2034 CBC, CMP reviewed, overall unremarkable.  Mild hypokalemia, replete p.o. [MM]  2058 Hcg neg [MM]    Clinical Course User Index [MM] Clarine Ozell LABOR, MD     FINAL CLINICAL IMPRESSION(S) / ED DIAGNOSES   Final diagnoses:  Pain of upper abdomen  Nausea and vomiting, unspecified vomiting type     Rx / DC Orders   ED Discharge Orders     None        Note:  This document was prepared using Dragon voice recognition software and may include unintentional dictation errors.   Clarine Ozell LABOR, MD 07/01/23 [phone]

## 2023-06-30 NOTE — ED Triage Notes (Signed)
 BIB ems from home   Ems called out for seizure like activity and abdominal pain.  Per ems when she has a seizure she will begin shaking and flopping around and once she comes out of it she will immediately begin talking with you and can tell you everything that has been said during the seizure. This happens when she has abdominal pain.  Pt complaining of abdominal pain during triage

## 2023-07-01 DIAGNOSIS — R112 Nausea with vomiting, unspecified: Secondary | ICD-10-CM | POA: Diagnosis not present

## 2023-07-01 LAB — GLUCOSE, CAPILLARY
Glucose-Capillary: 205 mg/dL — ABNORMAL HIGH (ref 70–99)
Glucose-Capillary: 191 mg/dL — ABNORMAL HIGH (ref 70–99)
Glucose-Capillary: 110 mg/dL — ABNORMAL HIGH (ref 70–99)
Glucose-Capillary: 173 mg/dL — ABNORMAL HIGH (ref 70–99)
Glucose-Capillary: 200 mg/dL — ABNORMAL HIGH (ref 70–99)

## 2023-07-01 LAB — CBC
HCT: 34.5 % — ABNORMAL LOW (ref 36.0–46.0)
Hemoglobin: 11 g/dL — ABNORMAL LOW (ref 12.0–15.0)
MCH: 24.4 pg — ABNORMAL LOW (ref 26.0–34.0)
MCHC: 31.9 g/dL (ref 30.0–36.0)
MCV: 76.7 fL — ABNORMAL LOW (ref 80.0–100.0)
Platelets: 242 10*3/uL (ref 150–400)
RBC: 4.5 MIL/uL (ref 3.87–5.11)
RDW: 14.8 % (ref 11.5–15.5)
WBC: 8.3 10*3/uL (ref 4.0–10.5)
nRBC: 0 % (ref 0.0–0.2)

## 2023-07-01 LAB — BASIC METABOLIC PANEL WITH GFR
Anion gap: 12 (ref 5–15)
BUN: 7 mg/dL (ref 6–20)
CO2: 20 mmol/L — ABNORMAL LOW (ref 22–32)
Calcium: 8.9 mg/dL (ref 8.9–10.3)
Chloride: 105 mmol/L (ref 98–111)
Creatinine, Ser: 0.4 mg/dL — ABNORMAL LOW (ref 0.44–1.00)
GFR, Estimated: >60 mL/min (ref >60)
Glucose, Bld: 193 mg/dL — ABNORMAL HIGH (ref 70–99)
Potassium: 3.3 mmol/L — ABNORMAL LOW (ref 3.5–5.1)
Sodium: 137 mmol/L (ref 135–145)

## 2023-07-01 LAB — MAGNESIUM: Magnesium: 1.8 mg/dL (ref 1.7–2.4)

## 2023-07-01 LAB — T4, FREE: Free T4: 3.75 ng/dL — ABNORMAL HIGH (ref 0.61–1.12)

## 2023-07-01 LAB — TSH: TSH: <0.01 u[IU]/mL — ABNORMAL LOW (ref 0.350–4.500)

## 2023-07-01 MED ORDER — ONDANSETRON HCL 4 MG PO TABS
4.0000 mg | ORAL_TABLET | Freq: Four times a day (QID) | ORAL | Status: DC | PRN
Start: 2023-07-01 — End: 2023-07-02

## 2023-07-01 MED ORDER — ACETAMINOPHEN 325 MG PO TABS
650.0000 mg | ORAL_TABLET | Freq: Four times a day (QID) | ORAL | Status: DC | PRN
Start: 2023-07-01 — End: 2023-07-02

## 2023-07-01 MED ORDER — SODIUM CHLORIDE 0.9 % IV BOLUS
1000.0000 mL | Freq: Once | INTRAVENOUS | Status: AC
  Administered 2023-07-01: 1000 mL via INTRAVENOUS

## 2023-07-01 MED ORDER — LACTATED RINGERS IV SOLN: INTRAVENOUS | Status: DC

## 2023-07-01 MED ORDER — PANTOPRAZOLE SODIUM 40 MG IV SOLR
40.0000 mg | Freq: Every day | INTRAVENOUS | Status: DC
  Administered 2023-07-01 – 2023-07-02 (×2): 40 mg via INTRAVENOUS
  Filled 2023-07-01 (×2): qty 10

## 2023-07-01 MED ORDER — POLYETHYLENE GLYCOL 3350 17 G PO PACK
17.0000 g | PACK | Freq: Every day | ORAL | Status: DC
  Administered 2023-07-02: 17 g via ORAL
  Filled 2023-07-01: qty 1

## 2023-07-01 MED ORDER — INSULIN ASPART 100 UNIT/ML IJ SOLN
0.0000 [IU] | Freq: Three times a day (TID) | INTRAMUSCULAR | Status: DC
  Administered 2023-07-01 – 2023-07-02 (×3): 3 [IU] via SUBCUTANEOUS
  Filled 2023-07-01 (×3): qty 1

## 2023-07-01 MED ORDER — ACETAMINOPHEN 650 MG RE SUPP: 650.0000 mg | Freq: Four times a day (QID) | RECTAL | Status: DC | PRN

## 2023-07-01 MED ORDER — QUETIAPINE FUMARATE 25 MG PO TABS
100.0000 mg | ORAL_TABLET | Freq: Every evening | ORAL | Status: DC | PRN
  Administered 2023-07-01: 100 mg via ORAL
  Filled 2023-07-01: qty 4

## 2023-07-01 MED ORDER — ONDANSETRON HCL 4 MG/2ML IJ SOLN
4.0000 mg | Freq: Four times a day (QID) | INTRAMUSCULAR | Status: DC | PRN
  Administered 2023-07-01 – 2023-07-02 (×2): 4 mg via INTRAVENOUS
  Filled 2023-07-01 (×2): qty 2

## 2023-07-01 MED ORDER — POTASSIUM CHLORIDE 20 MEQ PO PACK
40.0000 meq | PACK | ORAL | Status: AC
  Administered 2023-07-01 (×2): 40 meq via ORAL
  Filled 2023-07-01 (×2): qty 2

## 2023-07-01 MED ORDER — INSULIN ASPART 100 UNIT/ML IJ SOLN: 0.0000 [IU] | Freq: Every day | INTRAMUSCULAR | Status: DC

## 2023-07-01 MED ORDER — OXYCODONE-ACETAMINOPHEN 5-325 MG PO TABS
1.0000 | ORAL_TABLET | ORAL | Status: DC | PRN
  Administered 2023-07-01 – 2023-07-02 (×8): 2 via ORAL
  Filled 2023-07-01 (×8): qty 2

## 2023-07-01 MED ORDER — HYDROCODONE-ACETAMINOPHEN 5-325 MG PO TABS
1.0000 | ORAL_TABLET | ORAL | Status: DC | PRN
  Administered 2023-07-01: 1 via ORAL
  Filled 2023-07-01: qty 1

## 2023-07-01 MED ORDER — METHIMAZOLE 10 MG PO TABS
20.0000 mg | ORAL_TABLET | Freq: Two times a day (BID) | ORAL | Status: DC
  Administered 2023-07-01 – 2023-07-02 (×3): 20 mg via ORAL
  Filled 2023-07-01 (×3): qty 2

## 2023-07-01 MED ORDER — ENOXAPARIN SODIUM 40 MG/0.4ML IJ SOSY
40.0000 mg | PREFILLED_SYRINGE | INTRAMUSCULAR | Status: DC
  Administered 2023-07-01 – 2023-07-02 (×2): 40 mg via SUBCUTANEOUS
  Filled 2023-07-01 (×2): qty 0.4

## 2023-07-01 MED ORDER — ORAL CARE MOUTH RINSE: 15.0000 mL | OROMUCOSAL | Status: DC | PRN

## 2023-07-01 MED ORDER — PROPRANOLOL HCL 40 MG PO TABS
80.0000 mg | ORAL_TABLET | Freq: Three times a day (TID) | ORAL | Status: DC
  Administered 2023-07-01 – 2023-07-02 (×3): 80 mg via ORAL
  Filled 2023-07-01 (×5): qty 2

## 2023-07-01 MED ORDER — SENNOSIDES-DOCUSATE SODIUM 8.6-50 MG PO TABS
2.0000 | ORAL_TABLET | Freq: Two times a day (BID) | ORAL | Status: DC
  Administered 2023-07-01 – 2023-07-02 (×3): 2 via ORAL
  Filled 2023-07-01 (×3): qty 2

## 2023-07-01 MED ORDER — LACTULOSE 10 GM/15ML PO SOLN
20.0000 g | Freq: Once | ORAL | Status: AC
  Administered 2023-07-01: 20 g via ORAL
  Filled 2023-07-01: qty 30

## 2023-07-01 NOTE — H&P (Addendum)
 History and Physical    Patient: Nancy [name] FMW:[id number] DOB: Sep 03, 2000 DOA: 06/30/2023 DOS: the patient was seen and examined on 07/01/2023 PCP: Shelley Lax, FNP  Patient coming from: Home  Chief Complaint:  Chief Complaint  Patient presents with   Seizures   Abdominal Pain    HPI: Nancy [name] is a 23 y.o. 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   In the ED tachycardic to the 130s, mildly elevated BP but otherwise normal vitals.  Labs notable for potassium of 3 but otherwise unremarkable with normal lipase and LFTs and overall normal CBC.  EKG showing sinus tachycardia of 130.  CT abdomen and pelvis nonacute.  Patient treated with small NS bolus given morphine  and potassium repletion as well as a dose of Haldol .  Admission requested     Review of Systems: As mentioned in the history of present illness. All other systems reviewed and are negative.  Past Medical History:  Diagnosis Date   Acanthosis nigricans    Anxiety    CHF (congestive heart failure) (HCC)    Chronic lower back pain    Depression    DKA, type 1 (HCC) 09/13/2018   Dyspepsia    Obesity    Ovarian cyst    pt is not aware of this hx (11/24/2017)   Precocious adrenarche (HCC)    Premature baby    Seizures (HCC)    Type II diabetes mellitus (HCC)    insulin  dependant   Past Surgical History:  Procedure Laterality Date   ABDOMINAL HERNIA REPAIR     I was a baby   BIOPSY  10/12/2018   Procedure: BIOPSY;  Surgeon: Charlanne Groom, MD;  Location: [facility] ENDOSCOPY;  Service: Endoscopy;;   BIOPSY  02/28/2020   Procedure: BIOPSY;  Surgeon: San Sandor GAILS, DO;  Location: [facility] ENDOSCOPY;  Service: Gastroenterology;;   ESOPHAGOGASTRODUODENOSCOPY (EGD) WITH  PROPOFOL  N/A 10/12/2018   Procedure: ESOPHAGOGASTRODUODENOSCOPY (EGD) WITH PROPOFOL ;  Surgeon: Charlanne Groom, MD;  Location: [facility] ENDOSCOPY;  Service: Endoscopy;  Laterality: N/A;   ESOPHAGOGASTRODUODENOSCOPY (EGD) WITH PROPOFOL  N/A 02/28/2020   Procedure: ESOPHAGOGASTRODUODENOSCOPY (EGD) WITH PROPOFOL ;  Surgeon: San Sandor GAILS, DO;  Location: [facility] ENDOSCOPY;  Service: Gastroenterology;  Laterality: N/A;   FLEXIBLE SIGMOIDOSCOPY N/A 02/28/2020   Procedure: FLEXIBLE SIGMOIDOSCOPY;  Surgeon: San Sandor GAILS, DO;  Location: [facility] ENDOSCOPY;  Service: Gastroenterology;  Laterality: N/A;   HERNIA REPAIR     LEFT HEART CATH AND CORONARY ANGIOGRAPHY N/A 10/13/2018   Procedure: LEFT HEART CATH AND CORONARY ANGIOGRAPHY;  Surgeon: Verlin Lonni BIRCH, MD;  Location: [facility] INVASIVE CV LAB;  Service: Cardiovascular;  Laterality: N/A;   TONSILLECTOMY AND ADENOIDECTOMY     WISDOM TOOTH EXTRACTION  2017   Social History:  reports that she has been smoking e-cigarettes. She has never used smokeless tobacco. She reports that she does not drink alcohol and does not use drugs.  Allergies  Allergen Reactions   Oatmeal Anaphylaxis   Tomato Anaphylaxis   Acetaminophen  Other (See Comments)    Avoids due to liver   Ibuprofen  Other (See Comments)    GI MD said to not take this anymore    Family History  Problem Relation Age of Onset   Diabetes Mother    Hypertension Mother    Obesity Mother    Asthma Mother  Allergic rhinitis Mother    Eczema Mother    Cervical cancer Mother    Diabetes Father    Hypertension Father    Obesity Father    Hyperlipidemia Father    Hypertension Paternal Aunt    Hypertension Maternal Grandfather    Colon cancer Maternal Grandfather    Diabetes Paternal Grandmother    Obesity Paternal Grandmother    Diabetes Paternal Grandfather    Obesity Paternal Grandfather    Angioedema Neg Hx    Immunodeficiency Neg Hx    Urticaria Neg Hx    Stomach cancer Neg Hx    Esophageal  cancer Neg Hx     Prior to Admission medications   Medication Sig Start Date End Date Taking? Authorizing Provider  diphenoxylate -atropine  (LOMOTIL ) 2.5-0.025 MG tablet Take 1 tablet by mouth 4 (four) times daily as needed for diarrhea or loose stools. Patient not taking: Reported on 06/05/2023 05/25/23   Alexander, Natalie, DO  Glucagon  (BAQSIMI  TWO PACK) 3 MG/DOSE POWD Place 1 spray into the nose as needed. Patient not taking: Reported on 06/05/2023 08/23/21   Wouk, Devaughn Sayres, MD  Insulin  Aspart FlexPen (NOVOLOG ) 100 UNIT/ML Inject 15 Units into the skin as directed. Sliding scale insulin  as follows: BG 150-200  = 2 units BG  200-250 = 4 units BG  250-300 = 6 units BG  300-350 = 8 units BG  350-400 = 10 units 01/09/23   [provider]  insulin  glargine (LANTUS ) 100 unit/mL SOPN Inject 8 Units into the skin 2 (two) times daily. 05/25/23   Alexander, Natalie, DO  methimazole  (TAPAZOLE ) 10 MG tablet Take 2 tablets (20 mg total) by mouth 2 (two) times daily. 10/05/21   Awanda [place], MD  metoCLOPramide  (REGLAN ) 10 MG tablet Take 1 tablet (10 mg total) by mouth 3 (three) times daily before meals. 06/06/23 07/06/23  Alexander, Natalie, DO  propranolol  (INDERAL ) 80 MG tablet Take 1 tablet (80 mg total) by mouth 3 (three) times daily. 05/25/23   Alexander, Natalie, DO  QUEtiapine  (SEROQUEL ) 100 MG tablet Take 1 tablet (100 mg total) by mouth at bedtime as needed (insomnia). 06/06/23   Alexander, Natalie, DO  prochlorperazine  (COMPAZINE ) 25 MG suppository PLACE 1 SUPPOSITORY (25 MG TOTAL) RECTALLY EVERY TWELVE HOURS AS NEEDED FOR NAUSEA OR VOMITING. 02/29/20 02/29/20  Raenelle Donalda HERO, MD    Physical Exam: Vitals:   07/01/23 0218 07/01/23 0255 07/01/23 0324 07/01/23 0327  BP: (!) 184/93   133/61  Pulse: (!) 131 (!) 128 (!) 117 (!) 119  Resp: 20 (!) 21  20  Temp: 99.3 F (37.4 C)   98.7 F (37.1 C)  TempSrc: Oral   Oral  SpO2: 100% 100% 100% 100%  Weight:      Height:       Physical  Exam Vitals and nursing note reviewed.  Constitutional:      General: She is not in acute distress. HENT:     Head: Normocephalic and atraumatic.   Cardiovascular:     Rate and Rhythm: Regular rhythm. Tachycardia present.     Heart sounds: Normal heart sounds.  Pulmonary:     Effort: Pulmonary effort is normal.     Breath sounds: Normal breath sounds.  Abdominal:     Palpations: Abdomen is soft.     Tenderness: There is no abdominal tenderness.   Neurological:     Mental Status: Mental status is at baseline.     Labs on Admission: I have personally reviewed following labs and imaging  studies  CBC: Recent Labs  Lab 06/30/23 1928  WBC 5.3  NEUTROABS 3.2  HGB 12.1  HCT 36.3  MCV 74.7*  PLT 252   Basic Metabolic Panel: Recent Labs  Lab 06/30/23 1928  NA 138  K 3.0*  CL 102  CO2 19*  GLUCOSE 165*  BUN 8  CREATININE 0.45  CALCIUM  9.8   GFR: Estimated Creatinine Clearance: 112.5 mL/min (by C-G formula based on SCr of 0.45 mg/dL). Liver Function Tests: Recent Labs  Lab 06/30/23 1928  AST 35  ALT 22  ALKPHOS 85  BILITOT 1.3*  PROT 7.5  ALBUMIN 4.4   Recent Labs  Lab 06/30/23 1928  LIPASE 23   No results for input(s): AMMONIA in the last 168 hours. Coagulation Profile: No results for input(s): INR, PROTIME in the last 168 hours. Cardiac Enzymes: No results for input(s): CKTOTAL, CKMB, CKMBINDEX, TROPONINI in the last 168 hours. BNP (last 3 results) No results for input(s): PROBNP in the last 8760 hours. HbA1C: No results for input(s): HGBA1C in the last 72 hours. CBG: No results for input(s): GLUCAP in the last 168 hours. Lipid Profile: No results for input(s): CHOL, HDL, LDLCALC, TRIG, CHOLHDL, LDLDIRECT in the last 72 hours. Thyroid  Function Tests: No results for input(s): TSH, T4TOTAL, FREET4, T3FREE, THYROIDAB in the last 72 hours. Anemia Panel: No results for input(s): VITAMINB12, FOLATE,  FERRITIN, TIBC, IRON, RETICCTPCT in the last 72 hours. Urine analysis:    Component Value Date/Time   COLORURINE YELLOW (A) 05/01/2023 1535   APPEARANCEUR CLEAR (A) 05/01/2023 1535   LABSPEC 1.029 05/01/2023 1535   PHURINE 5.0 05/01/2023 1535   GLUCOSEU 150 (A) 05/01/2023 1535   HGBUR NEGATIVE 05/01/2023 1535   BILIRUBINUR NEGATIVE 05/01/2023 1535   BILIRUBINUR negative 07/13/2018 1651   KETONESUR 20 (A) 05/01/2023 1535   PROTEINUR NEGATIVE 05/01/2023 1535   UROBILINOGEN 0.2 07/13/2018 1651   UROBILINOGEN 1.0 07/09/2017 1324   NITRITE NEGATIVE 05/01/2023 1535   LEUKOCYTESUR NEGATIVE 05/01/2023 1535    Radiological Exams on Admission: CT ABDOMEN PELVIS W CONTRAST Result Date: 06/30/2023 CLINICAL DATA:  Unspecified abdominal pain EXAM: CT ABDOMEN AND PELVIS WITH CONTRAST TECHNIQUE: Multidetector CT imaging of the abdomen and pelvis was performed using the standard protocol following bolus administration of intravenous contrast. RADIATION DOSE REDUCTION: This exam was performed according to the departmental dose-optimization program which includes automated exposure control, adjustment of the mA and/or kV according to patient size and/or use of iterative reconstruction technique. CONTRAST:  OMNIPAQUE  IOHEXOL  350 MG/ML SOLN COMPARISON:  None Available. FINDINGS: Lower chest: No acute abnormality. Hepatobiliary: No focal liver abnormality is seen. No gallstones, gallbladder wall thickening, or biliary dilatation. Pancreas: Unremarkable Spleen: Unremarkable Adrenals/Urinary Tract: The right adrenal gland is unremarkable. Stable 15 mm nodule within the left adrenal gland, indeterminate, but stable since remote prior examination of 08/26/2019 and most in keeping with a benign adrenal adenoma. No follow-up imaging is recommended. The kidneys are unremarkable. The bladder is unremarkable. Stomach/Bowel: Stomach is within normal limits. Appendix appears normal. No evidence of bowel wall  thickening, distention, or inflammatory changes. Vascular/Lymphatic: Aortic atherosclerosis. No enlarged abdominal or pelvic lymph nodes. Reproductive: Uterus and bilateral adnexa are unremarkable. Other: No abdominal wall hernia or abnormality. No abdominopelvic ascites. Musculoskeletal: No acute or significant osseous findings. IMPRESSION: No acute intra-abdominal pathology identified. Stable 15 mm probable benign left adrenal adenoma for which no follow-up imaging is recommended. Electronically Signed   By: Dorethia Molt M.D.   On: 06/30/2023 23:57  Data Reviewed for HPI: Relevant notes from primary care and specialist visits, past discharge summaries as available in EHR, including Care Everywhere. Prior diagnostic testing as pertinent to current admission diagnoses Updated medications and problem lists for reconciliation ED course, including vitals, labs, imaging, treatment and response to treatment Triage notes, nursing and pharmacy notes and ED provider's notes Notable results as noted above in HPI      Assessment and Plan: * Intractable vomiting with nausea Diabetic gastroparesis with abdominal pain Labs and imaging studies unremarkable IV antiemetics, IV Protonix , pain meds Clear liquid diet  Hypokalemia Likely secondary to GI losses Oral repletion was given in the ED Continue to monitor and replete as necessary  Sinus tachycardia Hyperthyroidism with history of ICU admission for thyroid  storm 04/2023 Tachycardic to the 130s IV bolus and reassess Thyroid  function panel ordered Resume propranolol  and methimazole   Diabetes mellitus without complication (HCC) Sliding scale insulin  coverage  History of psychogenic nonepileptic seizure Pseudoseizure was mentioned in triage note-patient has known history of same Per ED provider note, no activity observed Quetiapine  resumed     DVT prophylaxis: Lovenox   Consults: none  Advance Care Planning:   Code Status: Prior    Family Communication: none  Disposition Plan: Back to previous home environment  Severity of Illness: The appropriate patient status for this patient is OBSERVATION. Observation status is judged to be reasonable and necessary in order to provide the required intensity of service to ensure the patient's safety. The patient's presenting symptoms, physical exam findings, and initial radiographic and laboratory data in the context of their medical condition is felt to place them at decreased risk for further clinical deterioration. Furthermore, it is anticipated that the patient will be medically stable for discharge from the hospital within 2 midnights of admission.   Author: Delayne LULLA Solian, MD 07/01/2023 3:53 AM  For on call review [email or web address].

## 2023-07-01 NOTE — Progress Notes (Signed)
   07/01/23 0230  Spiritual Encounters  Type of Visit Initial  Care provided to: Pt not available  Referral source Other (comment) (Rapid Response)  Reason for visit Urgent spiritual support  OnCall Visit No   Chaplain responded to a rapid response.  Patient attended to by the medical team. No family present. If a chaplain is requested someone will respond.   Carley [place] [facility] [phone]

## 2023-07-01 NOTE — Progress Notes (Signed)
  Progress Note   Patient: Nancy [name] FMW:[id number] DOB: 05/14/2000 DOA: 06/30/2023     0 DOS: the patient was seen and examined on 07/01/2023   Brief hospital course: Nancy [name] is a 23 y.o. 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     Principal Problem:   Intractable vomiting with nausea Active Problems:   Gastroparesis due to DM (HCC)   Hypokalemia   Sinus tachycardia   Hyperthyroidism   Diabetes mellitus without complication (HCC)   Class 1 obesity   History of psychogenic nonepileptic seizure   Assessment and Plan: * Intractable vomiting with nausea Diabetic gastroparesis with abdominal pain Chronic constipation. Labs and imaging studies unremarkable Patient also has a chronic constipation, she normally has a bowel movement every month.  Last bowel movement was 6/19. Urine hCG negative. There is a component of gastroparesis, but chronic constipation may also played a role. Continue symptomatic treatment, continue IV fluids until patient can tolerate a diet. Patient feels nauseous better today, still has a poor appetite.  I have changed diet to regular.  Continue to follow for another day.  Hypokalemia Repleted potassium again today, magnesium  level normal.  Sinus tachycardia Hyperthyroidism with history of ICU admission for thyroid  storm 04/2023 Still has significant elevation in free T4 and a decreased TSH. But the patient does not seem to be in thyroid  storm.  Heart rate gradually coming down after IV fluids.  Continue home treatment.  Diabetes mellitus without complication (HCC) Sliding scale insulin  coverage  History of psychogenic nonepileptic seizure Pseudoseizure was mentioned in triage note-patient has  known history of same Per ED provider note, no activity observed Quetiapine  resumed  Class I obesity with BMI 33.48     Subjective:  Patient still has a poor appetite, but no additional vomiting.  Some intermittent abdominal cramping pain, last bowel movement 6/19.  Physical Exam: Vitals:   07/01/23 0345 07/01/23 0421 07/01/23 0825 07/01/23 1240  BP:  132/67 119/63 113/68  Pulse: (!) 109 (!) 118 (!) 114 93  Resp: (!) 28 20 17 17   Temp:  98.2 F (36.8 C) 97.8 F (36.6 C) 97.7 F (36.5 C)  TempSrc:      SpO2: 96% 100% 100% 100%  Weight:      Height:       General exam: Appears calm and comfortable  Respiratory system: Clear to auscultation. Respiratory effort normal. Cardiovascular system: S1 & S2 heard, RRR. No JVD, murmurs, rubs, gallops or clicks. No pedal edema. Gastrointestinal system: Abdomen is nondistended, soft and nontender. No organomegaly or masses felt. Normal bowel sounds heard. Central nervous system: Alert and oriented. No focal neurological deficits. Extremities: Symmetric 5 x 5 power. Skin: No rashes, lesions or ulcers Psychiatry: Judgement and insight appear normal. Mood & affect appropriate.    Data Reviewed:  CT scan the lab results reviewed.  Family Communication: None  Disposition: Status is: Observation      Time spent: No charge minutes  Author: Murvin Mana, MD 07/01/2023 4:25 PM  For on call review [email or web address].

## 2023-07-01 NOTE — Progress Notes (Signed)
 Current vs T 98, BP 99/53, P 90, R 16 O2 sats 100% on RA, she has scheduled propanolol 80mg  TID, contacted Delayne Solian MD, ok to hold 2200 dose.

## 2023-07-01 NOTE — Plan of Care (Signed)

## 2023-07-01 NOTE — Inpatient Diabetes Management (Signed)
 Inpatient Diabetes Program Recommendations  AACE/ADA: New Consensus Statement on Inpatient Glycemic Control (2015)  Target Ranges:  Prepandial:   less than 140 mg/dL      Peak postprandial:   less than 180 mg/dL (1-2 hours)      Critically ill patients:  140 - 180 mg/dL    Latest Reference Range & Units 03/06/23 20:59  Hemoglobin A1C 4.8 - 5.6 % 5.8 (H)  (H): Data is abnormally high  Latest Reference Range & Units 07/01/23 04:23 07/01/23 08:07  Glucose-Capillary 70 - 99 mg/dL 794 (H) 808 (H)  3 units Novolog    (H): Data is abnormally high    Admit with:  Intractable vomiting with nausea Diabetic gastroparesis with abdominal pain Hypokalemia  History: Diabetes (per ENDO notes, LADA--managed as Type 1)  Home DM Meds: Lantus  8 units BID (ENDO notes from Jan 2025 state Lantus  8 units at HS)       Novolog  2-12 units TID per SSI  Current Orders: Novolog  Moderate Correction Scale/ SSI (0-15 units) TID AC + HS    MD- Please consider adding Semglee  8 units daily  Per ENDO notes pt has LADA--managed as Type 1     ENDO: [facility] Last Seen 01/09/2023 Was told to take the following: Reduce Lantus  to 8 units at bedtime Novolog  with meals:  If blood sugar is: Take: <70 ----------------------------> none and correct for low blood sugar 70-150 ------------------------> none 151-200 ----------------------> 2 units 201-250 ----------------------> 4 units 251-300 ----------------------> 6 units 301-350 ----------------------> 8 units (drink plenty of water and check urine ketones) 351-400 ----------------------> 10 units (drink plenty of water and check urine ketones) >400 --------------------------> 12 units (drink plenty of water, check urine ketones, and notify provider)     --Will follow patient during hospitalization--  Adina Rudolpho Arrow RN, MSN, CDCES Diabetes Coordinator Inpatient Glycemic Control Team Team Pager: [phone] (8a-5p)

## 2023-07-01 NOTE — Assessment & Plan Note (Signed)
 Sliding scale insulin coverage

## 2023-07-01 NOTE — Assessment & Plan Note (Addendum)
 Hyperthyroidism with history of ICU admission for thyroid  storm 04/2023 Tachycardic to the 130s IV bolus and reassess Thyroid  function panel ordered Resume propranolol  and methimazole 

## 2023-07-01 NOTE — Assessment & Plan Note (Addendum)
 Pseudoseizure was mentioned in triage note-patient has known history of same Per ED provider note, no activity observed Quetiapine  resumed

## 2023-07-01 NOTE — ED Notes (Addendum)
 Provider Viviann, MD made aware of pts VS, repeat EKG ordered, done, and given to provider

## 2023-07-01 NOTE — Assessment & Plan Note (Signed)
 Likely secondary to GI losses Oral repletion was given in the ED Continue to monitor and replete as necessary

## 2023-07-01 NOTE — Hospital Course (Signed)
 SABRA

## 2023-07-01 NOTE — Assessment & Plan Note (Signed)
 Diabetic gastroparesis with abdominal pain Labs and imaging studies unremarkable IV antiemetics, IV Protonix , pain meds Clear liquid diet

## 2023-07-01 NOTE — Hospital Course (Signed)
 Nancy [name] is a 23 y.o. 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   Patient is treated with IV fluids, symptomatic treatment.  She also received laxative as she has significant constipation.  Condition had improved, she is tolerating regular diet, had 2 large watery stools today.  Medically stable for discharge.

## 2023-07-02 ENCOUNTER — Other Ambulatory Visit: Payer: Self-pay

## 2023-07-02 DIAGNOSIS — E1143 Type 2 diabetes mellitus with diabetic autonomic (poly)neuropathy: Secondary | ICD-10-CM | POA: Diagnosis not present

## 2023-07-02 DIAGNOSIS — R112 Nausea with vomiting, unspecified: Secondary | ICD-10-CM | POA: Diagnosis not present

## 2023-07-02 DIAGNOSIS — E876 Hypokalemia: Secondary | ICD-10-CM

## 2023-07-02 DIAGNOSIS — K3184 Gastroparesis: Secondary | ICD-10-CM | POA: Diagnosis not present

## 2023-07-02 LAB — MAGNESIUM: Magnesium: 1.9 mg/dL (ref 1.7–2.4)

## 2023-07-02 LAB — BASIC METABOLIC PANEL WITH GFR
Anion gap: 4 — ABNORMAL LOW (ref 5–15)
BUN: 12 mg/dL (ref 6–20)
CO2: 25 mmol/L (ref 22–32)
Calcium: 9 mg/dL (ref 8.9–10.3)
Chloride: 108 mmol/L (ref 98–111)
Creatinine, Ser: 0.47 mg/dL (ref 0.44–1.00)
GFR, Estimated: >60 mL/min (ref >60)
Glucose, Bld: 168 mg/dL — ABNORMAL HIGH (ref 70–99)
Potassium: 3.8 mmol/L (ref 3.5–5.1)
Sodium: 137 mmol/L (ref 135–145)

## 2023-07-02 LAB — PHOSPHORUS: Phosphorus: 4.4 mg/dL (ref 2.5–4.6)

## 2023-07-02 LAB — GLUCOSE, CAPILLARY
Glucose-Capillary: 190 mg/dL — ABNORMAL HIGH (ref 70–99)
Glucose-Capillary: 109 mg/dL — ABNORMAL HIGH (ref 70–99)

## 2023-07-02 LAB — THYROID PANEL WITH TSH
Free Thyroxine Index: 13.6 — ABNORMAL HIGH (ref 1.2–4.9)
T3 Uptake Ratio: 55 % — ABNORMAL HIGH (ref 24–39)
T4, Total: 24.8 ug/dL — ABNORMAL HIGH (ref 4.5–12.0)
TSH: <0.005 u[IU]/mL — ABNORMAL LOW (ref 0.450–4.500)

## 2023-07-02 MED ORDER — LACTULOSE 10 GM/15ML PO SOLN
40.0000 g | Freq: Once | ORAL | Status: AC
  Administered 2023-07-02: 40 g via ORAL
  Filled 2023-07-02: qty 60

## 2023-07-02 MED ORDER — POLYETHYLENE GLYCOL 3350 17 GM/SCOOP PO POWD
17.0000 g | Freq: Two times a day (BID) | ORAL | 0 refills | Status: DC | PRN
Start: 2023-07-02 — End: 2023-11-23
  Filled 2023-07-02: qty 238, 7d supply, fill #0

## 2023-07-02 MED ORDER — SENNOSIDES-DOCUSATE SODIUM 8.6-50 MG PO TABS
2.0000 | ORAL_TABLET | Freq: Two times a day (BID) | ORAL | 0 refills | Status: AC
  Filled 2023-07-02: qty 100, 25d supply, fill #0

## 2023-07-02 MED ORDER — LACTULOSE 20 GM/30ML PO SOLN
20.0000 g | Freq: Two times a day (BID) | ORAL | 0 refills | Status: DC | PRN
  Filled 2023-07-02: qty 450, 8d supply, fill #0

## 2023-07-02 MED ORDER — FLEET ENEMA RE ENEM: 1.0000 | ENEMA | Freq: Once | RECTAL | Status: DC

## 2023-07-02 NOTE — Plan of Care (Signed)

## 2023-07-02 NOTE — Discharge Summary (Signed)
 Physician Discharge Summary   Patient: Nancy [name] MRN: [id number] DOB: [birth year]  Admit date:     06/30/2023  Discharge date: 07/02/23  Discharge Physician: Murvin Mana   PCP: Ramji, Amrita, FNP   Recommendations at discharge:   Follow-up with PCP as outpatient.  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 *  Hospital Course: Nancy [name] is a 23 y.o. 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   Patient is treated with IV fluids, symptomatic treatment.  She also received laxative as she has significant constipation.  Condition had improved, she is tolerating regular diet, had 2 large watery stools today.  Medically stable for discharge.  Assessment and Plan:  Intractable vomiting with nausea Diabetic gastroparesis with abdominal pain Chronic constipation. Labs and imaging studies unremarkable Patient also has a chronic constipation, she normally has a bowel movement every month.  Last bowel movement was 6/19. Urine hCG negative. There is a component of gastroparesis, but chronic constipation may also played a role. Was given fluids, she also received multiple doses of laxatives, she had 2 large watery stools today, nausea vomiting has resolved.  She was able to tolerate heart healthy diet.  Currently medically stable for discharge.   Hypokalemia Mild metabolic acidosis resolved. Potassium normalized.   Sinus  tachycardia Hyperthyroidism with history of ICU admission for thyroid  storm 04/2023 Still has significant elevation in free T4 and a decreased still low. But the patient does not seem to be in thyroid  storm.  Heart rate gradually coming down after IV fluids.  She no longer has any tachycardia today.  However, her free T4 still elevated and TSH very low. He needs to go back to her family doctor to adjust the medication, or refer to ENT for radiation iodine .   Diabetes mellitus without complication (HCC) Resume home treatment.   History of psychogenic nonepileptic seizure Pseudoseizure was mentioned in triage note-patient has known history of same Per ED provider note, no activity observed Quetiapine  resumed   Class I obesity with BMI 33.48       Consultants: None Procedures performed: None  Disposition: Home Diet recommendation:  Discharge Diet Orders (From admission, onward)     Start     Ordered   07/02/23 0000  Diet - low sodium heart healthy        07/02/23 1511           Cardiac diet DISCHARGE MEDICATION: Allergies as of 07/02/2023       Reactions   Tomato Anaphylaxis   Acetaminophen  Other (See Comments)   Avoids due to liver   Ibuprofen  Other (See Comments)   GI MD said to not take this anymore        Medication List     STOP taking these medications    diphenoxylate -atropine  2.5-0.025 MG tablet Commonly known as: LOMOTIL        TAKE these medications    Baqsimi  Two Pack 3 MG/DOSE Powd Generic drug: Glucagon  Place 1 spray into the nose as needed.  Insulin  Aspart FlexPen 100 UNIT/ML Commonly known as: NOVOLOG  Inject 15 Units into the skin as directed. Sliding scale insulin  as follows: BG 150-200  = 2 units BG  200-250 = 4 units BG  250-300 = 6 units BG  300-350 = 8 units BG  350-400 = 10 units   insulin  glargine 100 unit/mL Sopn Commonly known as: LANTUS  Inject 8 Units into the skin 2 (two) times daily.   Lactulose  20 GM/30ML Soln Take  30 mLs (20 g total) by mouth 2 (two) times daily as needed.   methimazole  10 MG tablet Commonly known as: TAPAZOLE  Take 2 tablets (20 mg total) by mouth 2 (two) times daily.   metoCLOPramide  10 MG tablet Commonly known as: REGLAN  Take 1 tablet (10 mg total) by mouth 3 (three) times daily before meals.   polyethylene glycol 17 g packet Commonly known as: MIRALAX  / GLYCOLAX  Take 17 g by mouth 2 (two) times daily as needed.   propranolol  80 MG tablet Commonly known as: INDERAL  Take 1 tablet (80 mg total) by mouth 3 (three) times daily.   QUEtiapine  100 MG tablet Commonly known as: SEROQUEL  Take 1 tablet (100 mg total) by mouth at bedtime as needed (insomnia).   senna-docusate 8.6-50 MG tablet Commonly known as: Senokot-S Take 2 tablets by mouth 2 (two) times daily.        Follow-up Information     Ramji, Amrita, FNP Follow up in 1 week(s).   Specialty: Family Medicine Why: Hospital follow up Contact information: [address] [facility] [address] [place] [address] [phone]                Discharge Exam: Nancy [name]   06/30/23 1926  Weight: 85.7 kg   General exam: Appears calm and comfortable  Respiratory system: Clear to auscultation. Respiratory effort normal. Cardiovascular system: S1 & S2 heard, RRR. No JVD, murmurs, rubs, gallops or clicks. No pedal edema. Gastrointestinal system: Abdomen is nondistended, soft and nontender. No organomegaly or masses felt. Normal bowel sounds heard. Central nervous system: Alert and oriented. No focal neurological deficits. Extremities: Symmetric 5 x 5 power. Skin: No rashes, lesions or ulcers Psychiatry: Judgement and insight appear normal. Mood & affect appropriate.    Condition at discharge: good  The results of significant diagnostics from this hospitalization (including imaging, microbiology, ancillary and laboratory) are listed below for reference.   Imaging Studies: CT ABDOMEN PELVIS W  CONTRAST Result Date: 06/30/2023 CLINICAL DATA:  Unspecified abdominal pain EXAM: CT ABDOMEN AND PELVIS WITH CONTRAST TECHNIQUE: Multidetector CT imaging of the abdomen and pelvis was performed using the standard protocol following bolus administration of intravenous contrast. RADIATION DOSE REDUCTION: This exam was performed according to the departmental dose-optimization program which includes automated exposure control, adjustment of the mA and/or kV according to patient size and/or use of iterative reconstruction technique. CONTRAST:  OMNIPAQUE  IOHEXOL  350 MG/ML SOLN COMPARISON:  None Available. FINDINGS: Lower chest: No acute abnormality. Hepatobiliary: No focal liver abnormality is seen. No gallstones, gallbladder wall thickening, or biliary dilatation. Pancreas: Unremarkable Spleen: Unremarkable Adrenals/Urinary Tract: The right adrenal gland is unremarkable. Stable 15 mm nodule within the left adrenal gland, indeterminate, but stable since remote prior examination of 08/26/2019 and most in keeping with a benign adrenal adenoma. No follow-up imaging is recommended. The kidneys are unremarkable. The bladder is unremarkable. Stomach/Bowel: Stomach is within normal limits. Appendix appears normal. No evidence of bowel wall thickening, distention, or inflammatory changes. Vascular/Lymphatic: Aortic atherosclerosis. No enlarged abdominal or pelvic  lymph nodes. Reproductive: Uterus and bilateral adnexa are unremarkable. Other: No abdominal wall hernia or abnormality. No abdominopelvic ascites. Musculoskeletal: No acute or significant osseous findings. IMPRESSION: No acute intra-abdominal pathology identified. Stable 15 mm probable benign left adrenal adenoma for which no follow-up imaging is recommended. Electronically Signed   By: Dorethia Molt M.D.   On: 06/30/2023 23:57   CT Head Wo Contrast Result Date: 06/05/2023 CLINICAL DATA:  seizures EXAM: CT HEAD WITHOUT CONTRAST TECHNIQUE: Contiguous axial  images were obtained from the base of the skull through the vertex without intravenous contrast. RADIATION DOSE REDUCTION: This exam was performed according to the departmental dose-optimization program which includes automated exposure control, adjustment of the mA and/or kV according to patient size and/or use of iterative reconstruction technique. COMPARISON:  May 23, 2023, May 03, 2023 FINDINGS: Brain: The ventricles appear age appropriate. No mass effect or midline shift. Gray-white differentiation is preserved without focal attenuation abnormality.No evidence of acute territorial infarction, extra-axial fluid collection, hemorrhage, or mass lesion. The basilar cisterns are patent without downward herniation. The cerebellar hemispheres and vermis are well formed without mass lesion or focal attenuation abnormality. Vascular: No hyperdense vessel. Skull: Normal. Negative for fracture or focal lesion. Sinuses/Orbits: The paranasal sinuses and mastoids are clear. The globes appear intact. No retrobulbar hematoma. Other: None. IMPRESSION: No acute intracranial abnormality, specifically, no acute hemorrhage, territorial infarction, or intracranial mass. Electronically Signed   By: Rogelia Myers M.D.   On: 06/05/2023 19:04   CT ABDOMEN PELVIS W CONTRAST Result Date: 06/05/2023 CLINICAL DATA:  Abdominal pain.  Vomiting. EXAM: CT ABDOMEN AND PELVIS WITH CONTRAST TECHNIQUE: Multidetector CT imaging of the abdomen and pelvis was performed using the standard protocol following bolus administration of intravenous contrast. RADIATION DOSE REDUCTION: This exam was performed according to the departmental dose-optimization program which includes automated exposure control, adjustment of the mA and/or kV according to patient size and/or use of iterative reconstruction technique. CONTRAST:  OMNIPAQUE  IOHEXOL  300 MG/ML  SOLN COMPARISON:  07/27/2022. FINDINGS: Lower chest: No acute abnormality. Hepatobiliary: No focal  liver abnormality is seen. No gallstones, gallbladder wall thickening, or biliary dilatation. Pancreas: Unremarkable. No pancreatic ductal dilatation or surrounding inflammatory changes. Spleen: Normal in size without focal abnormality. Adrenals/Urinary Tract: Stable 13 mm left adrenal nodule, unchanged since 08/26/2019, most compatible with a benign adenoma, for which no follow-up imaging is necessary. Right adrenal gland is unremarkable. No suspicious focal lesion. No urolithiasis or hydronephrosis. Bladder is unremarkable. Stomach/Bowel: Stomach is within normal limits. A few mildly distended fluid-filled loops of mid to distal small bowel. No evidence of obstruction or inflammatory changes. Appendix appears normal. Vascular/Lymphatic: No significant vascular findings are present. No enlarged abdominal or pelvic lymph nodes. Reproductive: Uterus and bilateral adnexa are unremarkable. Other: No abdominopelvic ascites.  No free air. Musculoskeletal: No acute or significant osseous findings. IMPRESSION: No acute localizing findings in the abdomen or pelvis. Electronically Signed   By: Harrietta Sherry M.D.   On: 06/05/2023 18:53    Microbiology: Results for orders placed or performed during the hospital encounter of 06/05/23  Resp panel by RT-PCR (RSV, Flu A&B, Covid) Anterior Nasal Swab     Status: None   Collection Time: 06/05/23  4:22 PM   Specimen: Anterior Nasal Swab  Result Value Ref Range Status   SARS Coronavirus 2 by RT PCR NEGATIVE NEGATIVE Final    Comment: (NOTE) SARS-CoV-2 target nucleic acids are NOT DETECTED.  The SARS-CoV-2 RNA is generally detectable in upper respiratory specimens during  the acute phase of infection. The lowest concentration of SARS-CoV-2 viral copies this assay can detect is 138 copies/mL. A negative result does not preclude SARS-Cov-2 infection and should not be used as the sole basis for treatment or other patient management decisions. A negative result may  occur with  improper specimen collection/handling, submission of specimen other than nasopharyngeal swab, presence of viral mutation(s) within the areas targeted by this assay, and inadequate number of viral copies(<138 copies/mL). A negative result must be combined with clinical observations, patient history, and epidemiological information. The expected result is Negative.  Fact Sheet for Patients:  BloggerCourse.com  Fact Sheet for Healthcare Providers:  SeriousBroker.it  This test is no t yet approved or cleared by the [place]  FDA and  has been authorized for detection and/or diagnosis of SARS-CoV-2 by FDA under an Emergency Use Authorization (EUA). This EUA will remain  in effect (meaning this test can be used) for the duration of the COVID-19 declaration under Section 564(b)(1) of the Act, 21 U.S.C.section 360bbb-3(b)(1), unless the authorization is terminated  or revoked sooner.       Influenza A by PCR NEGATIVE NEGATIVE Final   Influenza B by PCR NEGATIVE NEGATIVE Final    Comment: (NOTE) The Xpert Xpress SARS-CoV-2/FLU/RSV plus assay is intended as an aid in the diagnosis of influenza from Nasopharyngeal swab specimens and should not be used as a sole basis for treatment. Nasal washings and aspirates are unacceptable for Xpert Xpress SARS-CoV-2/FLU/RSV testing.  Fact Sheet for Patients: BloggerCourse.com  Fact Sheet for Healthcare Providers: SeriousBroker.it  This test is not yet approved or cleared by the [place]  FDA and has been authorized for detection and/or diagnosis of SARS-CoV-2 by FDA under an Emergency Use Authorization (EUA). This EUA will remain in effect (meaning this test can be used) for the duration of the COVID-19 declaration under Section 564(b)(1) of the Act, 21 U.S.C. section 360bbb-3(b)(1), unless the authorization is terminated  or revoked.     Resp Syncytial Virus by PCR NEGATIVE NEGATIVE Final    Comment: (NOTE) Fact Sheet for Patients: BloggerCourse.com  Fact Sheet for Healthcare Providers: SeriousBroker.it  This test is not yet approved or cleared by the [place]  FDA and has been authorized for detection and/or diagnosis of SARS-CoV-2 by FDA under an Emergency Use Authorization (EUA). This EUA will remain in effect (meaning this test can be used) for the duration of the COVID-19 declaration under Section 564(b)(1) of the Act, 21 U.S.C. section 360bbb-3(b)(1), unless the authorization is terminated or revoked.  Performed at [facility], [address] [place], [place] [address]     Labs: CBC: Recent Labs  Lab 06/30/23 1928 07/01/23 0456  WBC 5.3 8.3  NEUTROABS 3.2  --   HGB 12.1 11.0*  HCT 36.3 34.5*  MCV 74.7* 76.7*  PLT 252 242   Basic Metabolic Panel: Recent Labs  Lab 06/30/23 1928 07/01/23 0456 07/02/23 0403  NA 138 137 137  K 3.0* 3.3* 3.8  CL 102 105 108  CO2 19* 20* 25  GLUCOSE 165* 193* 168*  BUN 8 7 12   CREATININE 0.45 0.40* 0.47  CALCIUM  9.8 8.9 9.0  MG  --  1.8 1.9  PHOS  --   --  4.4   Liver Function Tests: Recent Labs  Lab 06/30/23 1928  AST 35  ALT 22  ALKPHOS 85  BILITOT 1.3*  PROT 7.5  ALBUMIN 4.4   CBG: Recent Labs  Lab 07/01/23 1215 07/01/23 1641 07/01/23 2038 07/02/23  0803 07/02/23 1211  GLUCAP 110* 173* 200* 190* 109*    Discharge time spent: 35 minutes  Signed: Murvin Mana, MD [facility] 07/02/2023

## 2023-07-02 NOTE — Inpatient Diabetes Management (Signed)
 Inpatient Diabetes Program Recommendations  AACE/ADA: New Consensus Statement on Inpatient Glycemic Control (2015)  Target Ranges:  Prepandial:   less than 140 mg/dL      Peak postprandial:   less than 180 mg/dL (1-2 hours)      Critically ill patients:  140 - 180 mg/dL    Latest Reference Range & Units 07/01/23 08:07 07/01/23 12:15 07/01/23 16:41 07/01/23 20:38  Glucose-Capillary 70 - 99 mg/dL 808 (H)  3 units Novolog   110 (H) 173 (H)  3 units Novolog   200 (H)  (H): Data is abnormally high  Latest Reference Range & Units 07/02/23 08:03  Glucose-Capillary 70 - 99 mg/dL 809 (H)  (H): Data is abnormally high   History: Diabetes (per ENDO notes, LADA--managed as Type 1)   Home DM Meds: Lantus  8 units BID (ENDO notes from Jan 2025 state Lantus  8 units at HS)                             Novolog  2-12 units TID per SSI   Current Orders: Novolog  Moderate Correction Scale/ SSI (0-15 units) TID AC + HS       MD- Please consider adding Semglee  8 units daily  Per ENDO notes pt has LADA--managed as Type 1         ENDO: [facility] Last Seen 01/09/2023 Was told to take the following: Reduce Lantus  to 8 units at bedtime Novolog  with meals:  If blood sugar is: Take: <70 ----------------------------> none and correct for low blood sugar 70-150 ------------------------> none 151-200 ----------------------> 2 units 201-250 ----------------------> 4 units 251-300 ----------------------> 6 units 301-350 ----------------------> 8 units (drink plenty of water and check urine ketones) 351-400 ----------------------> 10 units (drink plenty of water and check urine ketones) >400 --------------------------> 12 units (drink plenty of water, check urine ketones, and notify provider)    --Will follow patient during hospitalization--  Adina Rudolpho Arrow RN, MSN, CDCES Diabetes Coordinator Inpatient Glycemic Control Team Team Pager: [phone] (8a-5p)

## 2023-09-14 ENCOUNTER — Other Ambulatory Visit: Payer: Self-pay

## 2023-09-14 ENCOUNTER — Observation Stay
Admission: EM | Admit: 2023-09-14 | Discharge: 2023-09-15 | Disposition: A | Discharge status: Home / Self Care | Attending: Internal Medicine | Admitting: Internal Medicine

## 2023-09-14 ENCOUNTER — Observation Stay

## 2023-09-14 DIAGNOSIS — K3184 Gastroparesis: Secondary | ICD-10-CM | POA: Diagnosis not present

## 2023-09-14 DIAGNOSIS — E0591 Thyrotoxicosis, unspecified with thyrotoxic crisis or storm: Principal | ICD-10-CM

## 2023-09-14 DIAGNOSIS — R569 Unspecified convulsions: Secondary | ICD-10-CM

## 2023-09-14 DIAGNOSIS — E876 Hypokalemia: Secondary | ICD-10-CM | POA: Insufficient documentation

## 2023-09-14 DIAGNOSIS — E1143 Type 2 diabetes mellitus with diabetic autonomic (poly)neuropathy: Secondary | ICD-10-CM | POA: Diagnosis present

## 2023-09-14 DIAGNOSIS — E0500 Thyrotoxicosis with diffuse goiter without thyrotoxic crisis or storm: Secondary | ICD-10-CM | POA: Diagnosis not present

## 2023-09-14 DIAGNOSIS — I509 Heart failure, unspecified: Secondary | ICD-10-CM | POA: Insufficient documentation

## 2023-09-14 DIAGNOSIS — E0590 Thyrotoxicosis, unspecified without thyrotoxic crisis or storm: Secondary | ICD-10-CM | POA: Diagnosis present

## 2023-09-14 DIAGNOSIS — F1910 Other psychoactive substance abuse, uncomplicated: Secondary | ICD-10-CM | POA: Diagnosis present

## 2023-09-14 DIAGNOSIS — F17210 Nicotine dependence, cigarettes, uncomplicated: Secondary | ICD-10-CM | POA: Insufficient documentation

## 2023-09-14 DIAGNOSIS — F445 Conversion disorder with seizures or convulsions: Secondary | ICD-10-CM | POA: Diagnosis not present

## 2023-09-14 DIAGNOSIS — R000 Tachycardia, unspecified: Secondary | ICD-10-CM

## 2023-09-14 DIAGNOSIS — E1169 Type 2 diabetes mellitus with other specified complication: Secondary | ICD-10-CM

## 2023-09-14 DIAGNOSIS — Z794 Long term (current) use of insulin: Secondary | ICD-10-CM | POA: Insufficient documentation

## 2023-09-14 DIAGNOSIS — R55 Syncope and collapse: Secondary | ICD-10-CM

## 2023-09-14 LAB — CBC WITH DIFFERENTIAL/PLATELET
Abs Immature Granulocytes: 0.05 K/uL (ref 0.00–0.07)
Basophils Absolute: 0 K/uL (ref 0.0–0.1)
Basophils Relative: 0 %
Eosinophils Absolute: 0 K/uL (ref 0.0–0.5)
Eosinophils Relative: 0 %
HCT: 39.4 % (ref 36.0–46.0)
Hemoglobin: 12.8 g/dL (ref 12.0–15.0)
Immature Granulocytes: 0 %
Lymphocytes Relative: 9 %
Lymphs Abs: 1.4 K/uL (ref 0.7–4.0)
MCH: 24.2 pg — ABNORMAL LOW (ref 26.0–34.0)
MCHC: 32.5 g/dL (ref 30.0–36.0)
MCV: 74.3 fL — ABNORMAL LOW (ref 80.0–100.0)
Monocytes Absolute: 1.2 K/uL — ABNORMAL HIGH (ref 0.1–1.0)
Monocytes Relative: 8 %
Neutro Abs: 12.5 K/uL — ABNORMAL HIGH (ref 1.7–7.7)
Neutrophils Relative %: 83 %
Platelets: 302 K/uL (ref 150–400)
RBC: 5.3 MIL/uL — ABNORMAL HIGH (ref 3.87–5.11)
RDW: 13.2 % (ref 11.5–15.5)
WBC: 15.2 K/uL — ABNORMAL HIGH (ref 4.0–10.5)
nRBC: 0 % (ref 0.0–0.2)

## 2023-09-14 LAB — URINE DRUG SCREEN, QUALITATIVE (ARMC ONLY)
Amphetamines, Ur Screen: NOT DETECTED
Barbiturates, Ur Screen: NOT DETECTED
Benzodiazepine, Ur Scrn: POSITIVE — AB
Cannabinoid 50 Ng, Ur ~~LOC~~: POSITIVE — AB
Cocaine Metabolite,Ur ~~LOC~~: NOT DETECTED
MDMA (Ecstasy)Ur Screen: NOT DETECTED
Methadone Scn, Ur: NOT DETECTED
Opiate, Ur Screen: NOT DETECTED
Phencyclidine (PCP) Ur S: NOT DETECTED
Tricyclic, Ur Screen: NOT DETECTED

## 2023-09-14 LAB — BASIC METABOLIC PANEL WITH GFR
Anion gap: 13 (ref 5–15)
BUN: 7 mg/dL (ref 6–20)
CO2: 24 mmol/L (ref 22–32)
Calcium: 9.4 mg/dL (ref 8.9–10.3)
Chloride: 98 mmol/L (ref 98–111)
Creatinine, Ser: 0.41 mg/dL — ABNORMAL LOW (ref 0.44–1.00)
GFR, Estimated: >60 mL/min (ref >60)
Glucose, Bld: 276 mg/dL — ABNORMAL HIGH (ref 70–99)
Potassium: 2.9 mmol/L — ABNORMAL LOW (ref 3.5–5.1)
Sodium: 135 mmol/L (ref 135–145)

## 2023-09-14 LAB — URINALYSIS, W/ REFLEX TO CULTURE (INFECTION SUSPECTED)
Bilirubin Urine: NEGATIVE
Glucose, UA: >=500 mg/dL — AB
Hgb urine dipstick: NEGATIVE
Ketones, ur: 5 mg/dL — AB
Leukocytes,Ua: NEGATIVE
Nitrite: NEGATIVE
Protein, ur: 30 mg/dL — AB
RBC / HPF: 0 RBC/hpf (ref 0–5)
Specific Gravity, Urine: 1.007 (ref 1.005–1.030)
pH: 6 (ref 5.0–8.0)

## 2023-09-14 LAB — BLOOD GAS, VENOUS
Acid-Base Excess: 1 mmol/L (ref 0.0–2.0)
Bicarbonate: 25.2 mmol/L (ref 20.0–28.0)
O2 Saturation: 96.6 %
Patient temperature: 37
pCO2, Ven: 38 mmHg — ABNORMAL LOW (ref 44–60)
pH, Ven: 7.43 (ref 7.25–7.43)
pO2, Ven: 72 mmHg — ABNORMAL HIGH (ref 32–45)

## 2023-09-14 LAB — HEMOGLOBIN A1C
Hgb A1c MFr Bld: 6 % — ABNORMAL HIGH (ref 4.8–5.6)
Mean Plasma Glucose: 125.5 mg/dL

## 2023-09-14 LAB — GLUCOSE, CAPILLARY
Glucose-Capillary: 146 mg/dL — ABNORMAL HIGH (ref 70–99)
Glucose-Capillary: 207 mg/dL — ABNORMAL HIGH (ref 70–99)

## 2023-09-14 LAB — T4, FREE: Free T4: 4.05 ng/dL — ABNORMAL HIGH (ref 0.61–1.12)

## 2023-09-14 LAB — TSH: TSH: <0.1 u[IU]/mL — ABNORMAL LOW (ref 0.350–4.500)

## 2023-09-14 LAB — HEPATIC FUNCTION PANEL
ALT: 22 U/L (ref 0–44)
AST: 24 U/L (ref 15–41)
Albumin: 4 g/dL (ref 3.5–5.0)
Alkaline Phosphatase: 84 U/L (ref 38–126)
Bilirubin, Direct: 0.2 mg/dL (ref 0.0–0.2)
Indirect Bilirubin: 1.4 mg/dL — ABNORMAL HIGH (ref 0.3–0.9)
Total Bilirubin: 1.6 mg/dL — ABNORMAL HIGH (ref 0.0–1.2)
Total Protein: 7.3 g/dL (ref 6.5–8.1)

## 2023-09-14 LAB — CBG MONITORING, ED: Glucose-Capillary: 241 mg/dL — ABNORMAL HIGH (ref 70–99)

## 2023-09-14 LAB — MAGNESIUM: Magnesium: 2 mg/dL (ref 1.7–2.4)

## 2023-09-14 MED ORDER — SENNOSIDES-DOCUSATE SODIUM 8.6-50 MG PO TABS
2.0000 | ORAL_TABLET | Freq: Two times a day (BID) | ORAL | Status: DC
  Administered 2023-09-14 – 2023-09-15 (×2): 2 via ORAL
  Filled 2023-09-14 (×3): qty 2

## 2023-09-14 MED ORDER — INSULIN ASPART 100 UNIT/ML IJ SOLN
0.0000 [IU] | Freq: Three times a day (TID) | INTRAMUSCULAR | Status: DC
  Administered 2023-09-14 – 2023-09-15 (×2): 1 [IU] via SUBCUTANEOUS
  Filled 2023-09-14 (×2): qty 1

## 2023-09-14 MED ORDER — SODIUM CHLORIDE 0.9 % IV BOLUS
500.0000 mL | Freq: Once | INTRAVENOUS | Status: AC
  Administered 2023-09-14: 500 mL via INTRAVENOUS

## 2023-09-14 MED ORDER — POTASSIUM CHLORIDE CRYS ER 20 MEQ PO TBCR
40.0000 meq | EXTENDED_RELEASE_TABLET | Freq: Once | ORAL | Status: AC
  Administered 2023-09-14: 40 meq via ORAL
  Filled 2023-09-14: qty 2

## 2023-09-14 MED ORDER — METHIMAZOLE 10 MG PO TABS
20.0000 mg | ORAL_TABLET | Freq: Two times a day (BID) | ORAL | Status: DC
  Administered 2023-09-14: 20 mg via ORAL
  Administered 2023-09-15: 10 mg via ORAL
  Filled 2023-09-14 (×2): qty 2
  Filled 2023-09-14: qty 1
  Filled 2023-09-14: qty 2

## 2023-09-14 MED ORDER — POTASSIUM CHLORIDE CRYS ER 20 MEQ PO TBCR
10.0000 meq | EXTENDED_RELEASE_TABLET | Freq: Once | ORAL | Status: AC
  Administered 2023-09-14: 10 meq via ORAL
  Filled 2023-09-14: qty 1

## 2023-09-14 MED ORDER — INSULIN ASPART 100 UNIT/ML IJ SOLN
0.0000 [IU] | Freq: Every day | INTRAMUSCULAR | Status: DC
  Administered 2023-09-14: 2 [IU] via SUBCUTANEOUS
  Filled 2023-09-14: qty 1

## 2023-09-14 MED ORDER — METOCLOPRAMIDE HCL 10 MG PO TABS
10.0000 mg | ORAL_TABLET | Freq: Three times a day (TID) | ORAL | Status: DC
  Administered 2023-09-14 – 2023-09-15 (×3): 10 mg via ORAL
  Filled 2023-09-14 (×4): qty 1

## 2023-09-14 MED ORDER — PROPRANOLOL HCL 1 MG/ML IV SOLN
1.0000 mg | Freq: Once | INTRAVENOUS | Status: AC
  Administered 2023-09-14: 1 mg via INTRAVENOUS
  Filled 2023-09-14: qty 1

## 2023-09-14 MED ORDER — LACTATED RINGERS IV SOLN: INTRAVENOUS | Status: AC

## 2023-09-14 MED ORDER — ONDANSETRON HCL 4 MG PO TABS: 4.0000 mg | ORAL_TABLET | Freq: Four times a day (QID) | ORAL | Status: DC | PRN

## 2023-09-14 MED ORDER — SODIUM CHLORIDE 0.9 % IV BOLUS
1000.0000 mL | Freq: Once | INTRAVENOUS | Status: AC
  Administered 2023-09-14: 1000 mL via INTRAVENOUS

## 2023-09-14 MED ORDER — DROPERIDOL 2.5 MG/ML IJ SOLN
1.2500 mg | Freq: Once | INTRAMUSCULAR | Status: AC
  Administered 2023-09-14: 1.25 mg via INTRAVENOUS
  Filled 2023-09-14: qty 2

## 2023-09-14 MED ORDER — ONDANSETRON HCL 4 MG/2ML IJ SOLN: 4.0000 mg | Freq: Four times a day (QID) | INTRAMUSCULAR | Status: DC | PRN

## 2023-09-14 MED ORDER — INSULIN GLARGINE 100 UNIT/ML ~~LOC~~ SOLN
8.0000 [IU] | Freq: Two times a day (BID) | SUBCUTANEOUS | Status: DC
  Administered 2023-09-14 – 2023-09-15 (×3): 8 [IU] via SUBCUTANEOUS
  Filled 2023-09-14 (×5): qty 0.08

## 2023-09-14 MED ORDER — PROPRANOLOL HCL 40 MG PO TABS
80.0000 mg | ORAL_TABLET | Freq: Three times a day (TID) | ORAL | Status: DC
  Administered 2023-09-14 – 2023-09-15 (×3): 80 mg via ORAL
  Filled 2023-09-14 (×4): qty 2

## 2023-09-14 MED ORDER — PROPRANOLOL HCL 20 MG PO TABS
80.0000 mg | ORAL_TABLET | Freq: Once | ORAL | Status: DC
  Filled 2023-09-14: qty 4

## 2023-09-14 MED ORDER — POLYETHYLENE GLYCOL 3350 17 GM/SCOOP PO POWD: 17.0000 g | Freq: Two times a day (BID) | ORAL | Status: DC | PRN

## 2023-09-14 MED ORDER — OXYCODONE HCL 5 MG PO TABS
5.0000 mg | ORAL_TABLET | ORAL | Status: DC | PRN
  Administered 2023-09-14 – 2023-09-15 (×5): 5 mg via ORAL
  Filled 2023-09-14 (×5): qty 1

## 2023-09-14 MED ORDER — METHIMAZOLE 10 MG PO TABS
10.0000 mg | ORAL_TABLET | Freq: Once | ORAL | Status: DC
  Filled 2023-09-14: qty 1

## 2023-09-14 MED ORDER — ENOXAPARIN SODIUM 40 MG/0.4ML IJ SOSY
40.0000 mg | PREFILLED_SYRINGE | INTRAMUSCULAR | Status: DC
  Administered 2023-09-14: 40 mg via SUBCUTANEOUS
  Filled 2023-09-14: qty 0.4

## 2023-09-14 MED ORDER — MORPHINE SULFATE (PF) 4 MG/ML IV SOLN
4.0000 mg | INTRAVENOUS | Status: DC | PRN
  Administered 2023-09-14 – 2023-09-15 (×3): 4 mg via INTRAVENOUS
  Filled 2023-09-14 (×3): qty 1

## 2023-09-14 MED ORDER — LORAZEPAM 2 MG/ML IJ SOLN: 4.0000 mg | INTRAMUSCULAR | Status: DC | PRN

## 2023-09-14 NOTE — ED Triage Notes (Signed)
 Pt arrives from home via [facility] for persistent seizures despite her home Lamictal  and 5mg  versed . EMS reports 3 witnessed seizures lasting approximately 2 minutes each. Seizures present as tonic clonic, patient is able to maintain her airway and did not have bladder/bowel incontinence. Pt shaking upon arrival initially, but stopped. Pt was then able to tell me she was at a hospital but was unable to tell me her name. No respiratory distress upon arrival.

## 2023-09-14 NOTE — ED Provider Notes (Signed)
 [facility] Provider Note    Event Date/Time   First MD Initiated Contact with Patient 09/14/23 0840     (approximate)   History   Seizures   HPI  Nancy [name] is a 23 y.o. female with medical history significant for type 2 diabetes with gastroparesis, conversion disorder, PNES, chronic abdominal pain, known nonepileptic seizures, and hypothyroidism (thyroid  storm admission 04/2023) who presents to the emergency department with concern for seizure-like activity in the setting of several days of abdominal pain.  History initially obtained by EMS who reports that patient had seizure-like activity upon their arrival and en route.  She received 5 mg of Versed  which resolved the symptoms.  She was not hypoxic and had no urinary or bowel incontinence      Physical Exam   Triage Vital Signs: ED Triage Vitals  Encounter Vitals Group     BP      Girls Systolic BP Percentile      Girls Diastolic BP Percentile      Boys Systolic BP Percentile      Boys Diastolic BP Percentile      Pulse      Resp      Temp      Temp src      SpO2      Weight      Height      Head Circumference      Peak Flow      Pain Score      Pain Loc      Pain Education      Exclude from Growth Chart     Most recent vital signs: Vitals:   09/14/23 1302 09/14/23 1534  BP:  130/89  Pulse:  (!) 125  Resp:  18  Temp: 98 F (36.7 C) 98.4 F (36.9 C)  SpO2:  100%    Nursing Triage Note reviewed. Vital signs reviewed and patients oxygen saturation is normoxic  General: Patient is well nourished, well developed, awake and alert, resting comfortably in no acute distress Head: Normocephalic and atraumatic Eyes: Normal inspection, extraocular muscles intact, no conjunctival pallor Ear, nose, throat: Normal external exam Neck: Normal range of motion Respiratory: Patient is in no respiratory distress, lungs CTAB Cardiovascular: Patient is not tachycardic, RRR without murmur  appreciated GI: Abd SNT with no guarding or rebound  Back: Normal inspection of the back with good strength and range of motion throughout all ext Extremities: pulses intact with good cap refills, no LE pitting edema or calf tenderness Neuro: The patient is alert and oriented to person, place, and time but lethargic, appropriately conversive, with 5/5 bilat UE/LE strength, no gross motor or sensory defects noted. Coordination appears to be adequate. Skin: Warm, dry, and intact Psych: normal mood and affect, no SI or HI  ED Results / Procedures / Treatments   Labs (all labs ordered are listed, but only abnormal results are displayed) Labs Reviewed  CBC WITH DIFFERENTIAL/PLATELET - Abnormal; Notable for the following components:      Result Value   WBC 15.2 (*)    RBC 5.30 (*)    MCV 74.3 (*)    MCH 24.2 (*)    Neutro Abs 12.5 (*)    Monocytes Absolute 1.2 (*)    All other components within normal limits  BLOOD GAS, VENOUS - Abnormal; Notable for the following components:   pCO2, Ven 38 (*)    pO2, Ven 72 (*)    All other components  within normal limits  BASIC METABOLIC PANEL WITH GFR - Abnormal; Notable for the following components:   Potassium 2.9 (*)    Glucose, Bld 276 (*)    Creatinine, Ser 0.41 (*)    All other components within normal limits  TSH - Abnormal; Notable for the following components:   TSH <0.100 (*)    All other components within normal limits  T4, FREE - Abnormal; Notable for the following components:   Free T4 4.05 (*)    All other components within normal limits  HEPATIC FUNCTION PANEL - Abnormal; Notable for the following components:   Total Bilirubin 1.6 (*)    Indirect Bilirubin 1.4 (*)    All other components within normal limits  GLUCOSE, CAPILLARY - Abnormal; Notable for the following components:   Glucose-Capillary 146 (*)    All other components within normal limits  CBG MONITORING, ED - Abnormal; Notable for the following components:    Glucose-Capillary 241 (*)    All other components within normal limits  MAGNESIUM   URINE DRUG SCREEN, QUALITATIVE ([facility] ONLY)  HEMOGLOBIN A1C  T3, FREE  URINALYSIS, W/ REFLEX TO CULTURE (INFECTION SUSPECTED)  HIV ANTIBODY (ROUTINE TESTING W REFLEX)     EKG EKG and rhythm strip are interpreted by myself:   EKG: [Tachycardic sinus rhythm] at heart rate of 144 on, normal QRS duration, QTc 489, nonspecific ST segments and T waves no ectopy EKG not consistent with Acute STEMI Rhythm strip: Tachycardic sinus rhythm in lead II   RADIOLOGY None    PROCEDURES:  Critical Care performed: No  Procedures   MEDICATIONS ORDERED IN ED: Medications  propranolol  (INDERAL ) tablet 80 mg (80 mg Oral Given 09/14/23 1602)  methimazole  (TAPAZOLE ) tablet 20 mg (20 mg Oral Not Given 09/14/23 1516)  insulin  glargine (LANTUS ) injection 8 Units (has no administration in time range)  senna-docusate (Senokot-S) tablet 2 tablet (2 tablets Oral Not Given 09/14/23 1515)  polyethylene glycol powder (GLYCOLAX /MIRALAX ) container 17 g (has no administration in time range)  metoCLOPramide  (REGLAN ) tablet 10 mg (10 mg Oral Given 09/14/23 1602)  insulin  aspart (novoLOG ) injection 0-9 Units (has no administration in time range)  insulin  aspart (novoLOG ) injection 0-5 Units (has no administration in time range)  LORazepam  (ATIVAN ) injection 4 mg (has no administration in time range)  ondansetron  (ZOFRAN ) tablet 4 mg (has no administration in time range)    Or  ondansetron  (ZOFRAN ) injection 4 mg (has no administration in time range)  enoxaparin  (LOVENOX ) injection 40 mg (40 mg Subcutaneous Given 09/14/23 1603)  lactated ringers  infusion ( Intravenous New Bag/Given 09/14/23 1552)  oxyCODONE  (Oxy IR/ROXICODONE ) immediate release tablet 5 mg (5 mg Oral Given 09/14/23 1601)  sodium chloride  0.9 % bolus 1,000 mL (0 mLs Intravenous Stopped 09/14/23 1007)  sodium chloride  0.9 % bolus 500 mL (0 mLs Intravenous Stopped 09/14/23 1008)   potassium chloride  SA (KLOR-CON  M) CR tablet 10 mEq (10 mEq Oral Given 09/14/23 1005)  potassium chloride  SA (KLOR-CON  M) CR tablet 40 mEq (40 mEq Oral Given 09/14/23 1005)  droperidol  (INAPSINE ) 2.5 MG/ML injection 1.25 mg (1.25 mg Intravenous Given 09/14/23 1040)  propranolol  (INDERAL ) injection 1 mg (1 mg Intravenous Given 09/14/23 1109)     IMPRESSION / MDM / ASSESSMENT AND PLAN / ED COURSE                                Differential diagnosis includes, but is not limited to, PNES,  arrhythmia, electrolyte derangement, gastroparesis, thyroid  storm   ED course: Patient initially presented with shaking-like activity on arrival that does not seem consistent with seizure-like activity.  This resolved without intervention however patient declined to answer questions and stated that she just wanted to sleep.  She was reassessed and had no focal neurological deficits.  She remained tachycardic and although initially amenable to pills declined to take her home propranolol .  Blood work notable for worsening thyroid  function and given ongoing tachycardia case discussed with hospitalist for admission.  Of note patient did request something for abdominal pain and options limited given prolonged QT and the need to avoid further benzos, I did give a very small dose of droperidol  which seemed to help her symptoms   Clinical Course as of 09/14/23 1613  Mon Sep 14, 2023  0918 pH, Ven: 7.43 No significant acidosis [HD]  0957 Potassium(!): 2.9 Will replete [HD]  0957 Glucose-Capillary(!): 241 Elevated however patient has no anion gap and is receiving IV fluids expect this to downtrend [HD]  0958 WBC(!): 15.2 Possibly reactive but will need to reassess [HD]  1016 Patient reevaluated she awakes easily and tells me that she has been compliant with her thyroid  medication.  She remains very tachycardic despite this and complains of abdominal pain.  Her tachycardia does appear to be sinus however.  I will add on  liver function tests and treat the patient symptomatically [HD]  1020 Upon review, patient is supposed to be on propranolol  80 mg 3 times daily.  She is likely due for her propranolol  and will order such as well [HD]  1047 I was notified that patient is declining to take her propranolol  IV.  Blood pressure is currently 148/87 and heart rate is 142.  Unfortunately there is no good p.o. to IV conversion.  Will start with 1 mg of IV and monitor [HD]  1141 T4,Free(Direct)(!): 4.05 Seems to be more elevated than normal we discussed with hospitalist [HD]  1148 T4, free(!) [HD]  1213 Case discussed with hospitalist for admission [HD]    Clinical Course User Index [HD] Nicholaus Rolland BRAVO, MD     FINAL CLINICAL IMPRESSION(S) / ED DIAGNOSES   Final diagnoses:  Thyrotoxicosis with thyrotoxic crisis, unspecified thyrotoxicosis type  Tachycardia  Psychogenic nonepileptic seizure  Hypokalemia     Rx / DC Orders   ED Discharge Orders     None        Note:  This document was prepared using Dragon voice recognition software and may include unintentional dictation errors.   Nicholaus Rolland BRAVO, MD 09/14/23 [phone]

## 2023-09-14 NOTE — H&P (Addendum)
 History and Physical    Patient: Nancy [name] FMW:[id number] DOB: May 14, 2000 DOA: 09/14/2023 DOS: the patient was seen and examined on 09/14/2023 PCP: Shelley Lax, FNP  Patient coming from: Home  Chief Complaint:  Chief Complaint  Patient presents with   Seizures   HPI: Nancy [name] is a 23 y.o. female with medical history significant of thyrotoxicity(graves disease) ,  Psychogenic non-epileptic seizures,  insulin  dependent T2DM, gastroparesis, polysubstance abuse, chronic abdominal pain, conversion disorder, brought in for evaluation given seizures at home.  EMS reports they witnessed 3 tonic clonic seizures lasting approximately 2 minutes each en route, no bladder or bowel incontinence or tongue biting.  She was given versed  and droperidol .  Patient was lethargic during my encounter, falling asleep in between questions limiting her ability to provide history. She did report having around 10 seizures throughout the night at today. She also reported abdominal pain and headache in between falling asleep.    In the ED, she was in sinus tachycardia, otherwise afebrile, normotensive. WBC 15. TSH <0.11 with free T4 4.05.   Admission requested for observation   Addendum: Following admission, was called to bedside for new severe low back pain.   On my reassessment, patient was alert and oriented x 3, much more interactive than when seen previously.  She was in no distress and reports back pain is from  hitting her back on a washing machine while seizing.  On exam there is no red flag symptoms, no external evidence of trauma, some pain on palpation to the L-spine, negative straight leg raise bilaterally.  Will check L-spine x-ray.  Patient requesting IV pain medication.      Review of Systems: Unable to review all systems due to lack of cooperation from patient. Past Medical History:  Diagnosis Date   Acanthosis nigricans    Anxiety    CHF (congestive heart failure) (HCC)    Chronic  lower back pain    Depression    DKA, type 1 (HCC) 09/13/2018   Dyspepsia    Obesity    Ovarian cyst    pt is not aware of this hx (11/24/2017)   Precocious adrenarche (HCC)    Premature baby    Seizures (HCC)    Type II diabetes mellitus (HCC)    insulin  dependant   Past Surgical History:  Procedure Laterality Date   ABDOMINAL HERNIA REPAIR     I was a baby   BIOPSY  10/12/2018   Procedure: BIOPSY;  Surgeon: Charlanne Groom, MD;  Location: [facility] ENDOSCOPY;  Service: Endoscopy;;   BIOPSY  02/28/2020   Procedure: BIOPSY;  Surgeon: San Sandor GAILS, DO;  Location: [facility] ENDOSCOPY;  Service: Gastroenterology;;   ESOPHAGOGASTRODUODENOSCOPY (EGD) WITH PROPOFOL  N/A 10/12/2018   Procedure: ESOPHAGOGASTRODUODENOSCOPY (EGD) WITH PROPOFOL ;  Surgeon: Charlanne Groom, MD;  [facility] ENDOSCOPY;  Service: Endoscopy;  Laterality: N/A;   ESOPHAGOGASTRODUODENOSCOPY (EGD) WITH PROPOFOL  N/A 02/28/2020   Procedure: ESOPHAGOGASTRODUODENOSCOPY (EGD) WITH PROPOFOL ;  Surgeon: San Sandor GAILS, DO;  Location: [facility] ENDOSCOPY;  Service: Gastroenterology;  Laterality: N/A;   FLEXIBLE SIGMOIDOSCOPY N/A 02/28/2020   Procedure: FLEXIBLE SIGMOIDOSCOPY;  Surgeon: San Sandor GAILS, DO;  Location: [facility] ENDOSCOPY;  Service: Gastroenterology;  Laterality: N/A;   HERNIA REPAIR     LEFT HEART CATH AND CORONARY ANGIOGRAPHY N/A 10/13/2018   Procedure: LEFT HEART CATH AND CORONARY ANGIOGRAPHY;  Surgeon: Verlin Lonni BIRCH, MD;  Location: [facility] INVASIVE CV LAB;  Service: Cardiovascular;  Laterality: N/A;   TONSILLECTOMY AND ADENOIDECTOMY  WISDOM TOOTH EXTRACTION  2017   Social History:  reports that she has been smoking e-cigarettes. She has never used smokeless tobacco. She reports that she does not drink alcohol and does not use drugs.  Allergies  Allergen Reactions   Tomato Anaphylaxis   Acetaminophen  Other (See Comments)    Avoids due to liver   Ibuprofen  Other (See Comments)    GI MD said to not take this anymore     Family History  Problem Relation Age of Onset   Diabetes Mother    Hypertension Mother    Obesity Mother    Asthma Mother    Allergic rhinitis Mother    Eczema Mother    Cervical cancer Mother    Diabetes Father    Hypertension Father    Obesity Father    Hyperlipidemia Father    Hypertension Paternal Aunt    Hypertension Maternal Grandfather    Colon cancer Maternal Grandfather    Diabetes Paternal Grandmother    Obesity Paternal Grandmother    Diabetes Paternal Grandfather    Obesity Paternal Grandfather    Angioedema Neg Hx    Immunodeficiency Neg Hx    Urticaria Neg Hx    Stomach cancer Neg Hx    Esophageal cancer Neg Hx     Prior to Admission medications   Medication Sig Start Date End Date Taking? Authorizing Provider  Glucagon  (BAQSIMI  TWO PACK) 3 MG/DOSE POWD Place 1 spray into the nose as needed. 08/23/21   Wouk, Devaughn Sayres, MD  Insulin  Aspart FlexPen (NOVOLOG ) 100 UNIT/ML Inject 15 Units into the skin as directed. Sliding scale insulin  as follows: BG 150-200  = 2 units BG  200-250 = 4 units BG  250-300 = 6 units BG  300-350 = 8 units BG  350-400 = 10 units 01/09/23   [provider]  insulin  glargine (LANTUS ) 100 unit/mL SOPN Inject 8 Units into the skin 2 (two) times daily. 05/25/23   Alexander, Natalie, DO  Lactulose  20 GM/30ML SOLN Take 30 mLs (20 g total) by mouth 2 (two) times daily as needed. 07/02/23   Laurita Pillion, MD  methimazole  (TAPAZOLE ) 10 MG tablet Take 2 tablets (20 mg total) by mouth 2 (two) times daily. 10/05/21   Awanda City, MD  metoCLOPramide  (REGLAN ) 10 MG tablet Take 1 tablet (10 mg total) by mouth 3 (three) times daily before meals. 06/06/23 07/06/23  Alexander, Natalie, DO  polyethylene glycol powder (GLYCOLAX /MIRALAX ) 17 GM/SCOOP powder Take 17 g by mouth 2 (two) times daily as needed. 07/02/23   Laurita Pillion, MD  propranolol  (INDERAL ) 80 MG tablet Take 1 tablet (80 mg total) by mouth 3 (three) times daily. 05/25/23   Alexander,  Natalie, DO  QUEtiapine  (SEROQUEL ) 100 MG tablet Take 1 tablet (100 mg total) by mouth at bedtime as needed (insomnia). 06/06/23   Alexander, Natalie, DO  senna-docusate (SENOKOT-S) 8.6-50 MG tablet Take 2 tablets by mouth 2 (two) times daily. 07/02/23   Laurita Pillion, MD  prochlorperazine  (COMPAZINE ) 25 MG suppository PLACE 1 SUPPOSITORY (25 MG TOTAL) RECTALLY EVERY TWELVE HOURS AS NEEDED FOR NAUSEA OR VOMITING. 02/29/20 02/29/20  Raenelle Donalda HERO, MD    Physical Exam: Vitals:   09/14/23 1200 09/14/23 1230 09/14/23 1300 09/14/23 1302  BP: 133/69 (!) 116/58 131/76   Pulse: (!) 111 (!) 107    Resp: (!) 21     Temp:    98 F (36.7 C)  TempSrc:    Axillary  SpO2: 100% 100%    Weight:  Height:       Physical Exam Constitutional:      Comments: Drowsy but arousable    HENT:     Head: Normocephalic and atraumatic.     Mouth/Throat:     Mouth: Mucous membranes are moist.  Eyes:     General: Scleral icterus present.     Extraocular Movements: Extraocular movements intact.     Pupils: Pupils are equal, round, and reactive to light.  Cardiovascular:     Rate and Rhythm: Regular rhythm. Tachycardia present.  Abdominal:     General: Bowel sounds are normal. There is no distension.     Tenderness: There is no abdominal tenderness.  Musculoskeletal:        General: No signs of injury.     Cervical back: Neck supple.  Skin:    General: Skin is warm and dry.     Capillary Refill: Capillary refill takes less than 2 seconds.  Neurological:     Mental Status: She is oriented to person, place, and time. She is lethargic.     GCS: GCS eye subscore is 4. GCS verbal subscore is 5. GCS motor subscore is 6.     Cranial Nerves: Cranial nerves 2-12 are intact. No dysarthria or facial asymmetry.     Motor: No atrophy.  Psychiatric:     Comments: Unable to assess      Data Reviewed:   Labs on Admission: I have personally reviewed following labs and imaging studies  CBC: Recent Labs   Lab 09/14/23 0849  WBC 15.2*  NEUTROABS 12.5*  HGB 12.8  HCT 39.4  MCV 74.3*  PLT 302   Basic Metabolic Panel: Recent Labs  Lab 09/14/23 0849  NA 135  K 2.9*  CL 98  CO2 24  GLUCOSE 276*  BUN 7  CREATININE 0.41*  CALCIUM  9.4  MG 2.0   GFR: Estimated Creatinine Clearance: 105.6 mL/min (A) (by C-G formula based on SCr of 0.41 mg/dL (L)). Liver Function Tests: Recent Labs  Lab 09/14/23 0849  AST 24  ALT 22  ALKPHOS 84  BILITOT 1.6*  PROT 7.3  ALBUMIN 4.0   No results for input(s): LIPASE, AMYLASE in the last 168 hours. No results for input(s): AMMONIA in the last 168 hours. Coagulation Profile: No results for input(s): INR, PROTIME in the last 168 hours. Cardiac Enzymes: No results for input(s): CKTOTAL, CKMB, CKMBINDEX, TROPONINI in the last 168 hours. BNP (last 3 results) No results for input(s): PROBNP in the last 8760 hours. HbA1C: No results for input(s): HGBA1C in the last 72 hours. CBG: Recent Labs  Lab 09/14/23 0951  GLUCAP 241*   Lipid Profile: No results for input(s): CHOL, HDL, LDLCALC, TRIG, CHOLHDL, LDLDIRECT in the last 72 hours. Thyroid  Function Tests: Recent Labs    09/14/23 0849  TSH <0.100*  FREET4 4.05*   Anemia Panel: No results for input(s): VITAMINB12, FOLATE, FERRITIN, TIBC, IRON, RETICCTPCT in the last 72 hours. Urine analysis:    Component Value Date/Time   COLORURINE YELLOW (A) 05/01/2023 1535   APPEARANCEUR CLEAR (A) 05/01/2023 1535   LABSPEC 1.029 05/01/2023 1535   PHURINE 5.0 05/01/2023 1535   GLUCOSEU 150 (A) 05/01/2023 1535   HGBUR NEGATIVE 05/01/2023 1535   BILIRUBINUR NEGATIVE 05/01/2023 1535   BILIRUBINUR negative 07/13/2018 1651   KETONESUR 20 (A) 05/01/2023 1535   PROTEINUR NEGATIVE 05/01/2023 1535   UROBILINOGEN 0.2 07/13/2018 1651   UROBILINOGEN 1.0 07/09/2017 1324   NITRITE NEGATIVE 05/01/2023 1535   LEUKOCYTESUR NEGATIVE 05/01/2023  1535    Radiological  Exams on Admission: No results found.     Assessment and Plan: No notes have been filed under this hospital service. Service: Hospitalist   23 y.o. female with medical history significant of thyrotoxicity(graves disease) , PNES,  insulin  dependent T2DM, gastroparesis, polysubstance abuse, chronic abdominal pain, conversion disorder, brought in for evaluation given seizures at home.  With sinus tachycardia, TSH 0.10 and FT4 elevated on presentation and hx non-compliance with propranolol  and methimazole .    Seizure activity  PNES  - Difficult to discern if related to possible thyrotoxicosis at this time  given her history of non-epileptic seizures . Drowsy but arousable with no focal neuro deficit on exam.  - seizure precautions, neuro checks -Check Lamictal  level, patient states she has been compliant however has been throwing up -Continue Lamictal  when reconciled - PRN ativan   - check EEG, UDS.  - resume seroquel     Sinus tachycardia  Graves disease  - TSH 0.1, which is up from 0.005 06/30/23.  Hx medication non-compliance, cyclical nausea/vomiting, and sinus tachycardia fairly chronic. Presentation suspicious for thyroid  storm although multiple confounding factors present.  Pt protecting airway and HD stable.  - neurochecks  - f/u UDS - resume home propranolol  and methimazole   - f/u Endocrinology  - telemetry  - will check UA and blood cultures given SIRS criteria met with tachycardia and leukocytosis (although low concern for sepsis)   T2DM Gastroparesis  - continue bowel regimen and PRN antiemetics  - lantus  8u BID  - SSI   Monitor/replace electrolytes  LR@100   Lovenox   Carb modified diet    Advance Care Planning:   Code Status: Full Code      Severity of Illness: The appropriate patient status for this patient is OBSERVATION. Observation status is judged to be reasonable and necessary in order to provide the required intensity of service to ensure the patient's  safety. The patient's presenting symptoms, physical exam findings, and initial radiographic and laboratory data in the context of their medical condition is felt to place them at decreased risk for further clinical deterioration. Furthermore, it is anticipated that the patient will be medically stable for discharge from the hospital within 2 midnights of admission.   Author: Daved JAYSON Pump, DO 09/14/2023 1:46 PM  For on call review [email or web address].

## 2023-09-14 NOTE — Progress Notes (Signed)
   09/14/23 1534  Assess: MEWS Score  Temp 98.4 F (36.9 C)  BP 130/89  MAP (mmHg) 98  Pulse Rate (!) 125  Resp 18  SpO2 100 %  O2 Device Room Air  Assess: MEWS Score  MEWS Temp 0  MEWS Systolic 0  MEWS Pulse 2  MEWS RR 0  MEWS LOC 1  MEWS Score 3  MEWS Score Color Yellow  Assess: if the MEWS score is Yellow or Red  Were vital signs accurate and taken at a resting state? Yes  Does the patient meet 2 or more of the SIRS criteria? No  MEWS guidelines implemented  Yes, yellow  Treat  MEWS Interventions Considered administering scheduled or prn medications/treatments as ordered  Take Vital Signs  Increase Vital Sign Frequency  Yellow: Q2hr x1, continue Q4hrs until patient remains green for 12hrs  Escalate  MEWS: Escalate Yellow: Discuss with charge nurse and consider notifying provider and/or RRT  Notify: Charge Nurse/RN  Name of Charge Nurse/RN Notified Health visitor  Provider Notification  Provider Name/Title Dr Daved Pump  Date Provider Notified 09/14/23  Time Provider Notified 1613  Method of Notification  (secure chat)  Notification Reason Change in status (elevated HR)  Provider response No new orders  Date of Provider Response 09/14/23  Time of Provider Response 1615  Assess: SIRS CRITERIA  SIRS Temperature  0  SIRS Respirations  0  SIRS Pulse 1  SIRS WBC 0  SIRS Score Sum  1

## 2023-09-15 ENCOUNTER — Observation Stay

## 2023-09-15 DIAGNOSIS — F444 Conversion disorder with motor symptom or deficit: Secondary | ICD-10-CM | POA: Diagnosis not present

## 2023-09-15 LAB — COMPREHENSIVE METABOLIC PANEL WITH GFR
ALT: 33 U/L (ref 0–44)
AST: 40 U/L (ref 15–41)
Albumin: 3.7 g/dL (ref 3.5–5.0)
Alkaline Phosphatase: 73 U/L (ref 38–126)
Anion gap: 10 (ref 5–15)
BUN: 11 mg/dL (ref 6–20)
CO2: 24 mmol/L (ref 22–32)
Calcium: 9.3 mg/dL (ref 8.9–10.3)
Chloride: 104 mmol/L (ref 98–111)
Creatinine, Ser: 0.41 mg/dL — ABNORMAL LOW (ref 0.44–1.00)
GFR, Estimated: >60 mL/min (ref >60)
Glucose, Bld: 132 mg/dL — ABNORMAL HIGH (ref 70–99)
Potassium: 3.1 mmol/L — ABNORMAL LOW (ref 3.5–5.1)
Sodium: 138 mmol/L (ref 135–145)
Total Bilirubin: 1.3 mg/dL — ABNORMAL HIGH (ref 0.0–1.2)
Total Protein: 6.7 g/dL (ref 6.5–8.1)

## 2023-09-15 LAB — CBC
HCT: 37.2 % (ref 36.0–46.0)
Hemoglobin: 12 g/dL (ref 12.0–15.0)
MCH: 24.5 pg — ABNORMAL LOW (ref 26.0–34.0)
MCHC: 32.3 g/dL (ref 30.0–36.0)
MCV: 76.1 fL — ABNORMAL LOW (ref 80.0–100.0)
Platelets: 275 K/uL (ref 150–400)
RBC: 4.89 MIL/uL (ref 3.87–5.11)
RDW: 13.7 % (ref 11.5–15.5)
WBC: 12.8 K/uL — ABNORMAL HIGH (ref 4.0–10.5)
nRBC: 0 % (ref 0.0–0.2)

## 2023-09-15 LAB — PROTIME-INR
INR: 1.2 (ref 0.8–1.2)
Prothrombin Time: 15.6 s — ABNORMAL HIGH (ref 11.4–15.2)

## 2023-09-15 LAB — HIV ANTIBODY (ROUTINE TESTING W REFLEX): HIV Screen 4th Generation wRfx: NONREACTIVE

## 2023-09-15 LAB — T3, FREE: T3, Free: 18.2 pg/mL — ABNORMAL HIGH (ref 2.0–4.4)

## 2023-09-15 LAB — GLUCOSE, CAPILLARY
Glucose-Capillary: 103 mg/dL — ABNORMAL HIGH (ref 70–99)
Glucose-Capillary: 132 mg/dL — ABNORMAL HIGH (ref 70–99)

## 2023-09-15 MED ORDER — OXYCODONE HCL 5 MG PO TABS: 5.0000 mg | ORAL_TABLET | Freq: Four times a day (QID) | ORAL | 0 refills | Status: AC | PRN

## 2023-09-15 MED ORDER — POTASSIUM CHLORIDE CRYS ER 20 MEQ PO TBCR
40.0000 meq | EXTENDED_RELEASE_TABLET | Freq: Two times a day (BID) | ORAL | Status: DC
  Administered 2023-09-15: 40 meq via ORAL
  Filled 2023-09-15: qty 2

## 2023-09-15 NOTE — Progress Notes (Signed)
 MD order received in [facility] to discharge pt home today; verbally reviewed AVS with pt, Rx for Oxycodone  escribed to the [facility] in [place], [place]; no questions voiced at this time; pt's discharge pending arrival of her ride home; verbally instructed pt to use her nurse callbell to advise when her ride is here in order to get a wheelchair for pt discharge

## 2023-09-15 NOTE — Plan of Care (Signed)
  Problem: Pain Managment: Goal: General experience of comfort will improve and/or be controlled Outcome: Progressing   Problem: Safety: Goal: Ability to remain free from injury will improve Outcome: Progressing

## 2023-09-15 NOTE — Plan of Care (Signed)
  Problem: Education: Goal: Ability to describe self-care measures that may prevent or decrease complications (Diabetes Survival Skills Education) will improve Outcome: Adequate for Discharge Goal: Individualized Educational Video(s) Outcome: Adequate for Discharge   Problem: Coping: Goal: Ability to adjust to condition or change in health will improve Outcome: Adequate for Discharge   Problem: Fluid Volume: Goal: Ability to maintain a balanced intake and output will improve Outcome: Adequate for Discharge   Problem: Health Behavior/Discharge Planning: Goal: Ability to identify and utilize available resources and services will improve Outcome: Adequate for Discharge Goal: Ability to manage health-related needs will improve Outcome: Adequate for Discharge   Problem: Metabolic: Goal: Ability to maintain appropriate glucose levels will improve Outcome: Adequate for Discharge   Problem: Nutritional: Goal: Maintenance of adequate nutrition will improve Outcome: Adequate for Discharge Goal: Progress toward achieving an optimal weight will improve Outcome: Adequate for Discharge   Problem: Skin Integrity: Goal: Risk for impaired skin integrity will decrease Outcome: Adequate for Discharge   Problem: Tissue Perfusion: Goal: Adequacy of tissue perfusion will improve Outcome: Adequate for Discharge   Problem: Education: Goal: Knowledge of General Education information will improve Description: Including pain rating scale, medication(s)/side effects and non-pharmacologic comfort measures Outcome: Adequate for Discharge   Problem: Health Behavior/Discharge Planning: Goal: Ability to manage health-related needs will improve Outcome: Adequate for Discharge   Problem: Clinical Measurements: Goal: Ability to maintain clinical measurements within normal limits will improve Outcome: Adequate for Discharge Goal: Will remain free from infection Outcome: Adequate for Discharge Goal:  Diagnostic test results will improve Outcome: Adequate for Discharge Goal: Respiratory complications will improve Outcome: Adequate for Discharge Goal: Cardiovascular complication will be avoided Outcome: Adequate for Discharge   Problem: Activity: Goal: Risk for activity intolerance will decrease Outcome: Adequate for Discharge   Problem: Nutrition: Goal: Adequate nutrition will be maintained Outcome: Adequate for Discharge   Problem: Coping: Goal: Level of anxiety will decrease Outcome: Adequate for Discharge   Problem: Elimination: Goal: Will not experience complications related to bowel motility Outcome: Adequate for Discharge Goal: Will not experience complications related to urinary retention Outcome: Adequate for Discharge   Problem: Pain Managment: Goal: General experience of comfort will improve and/or be controlled Outcome: Adequate for Discharge   Problem: Safety: Goal: Ability to remain free from injury will improve Outcome: Adequate for Discharge   Problem: Skin Integrity: Goal: Risk for impaired skin integrity will decrease Outcome: Adequate for Discharge   Problem: Education: Goal: Expressions of having a comfortable level of knowledge regarding the disease process will increase Outcome: Adequate for Discharge   Problem: Coping: Goal: Ability to adjust to condition or change in health will improve Outcome: Adequate for Discharge Goal: Ability to identify appropriate support needs will improve Outcome: Adequate for Discharge   Problem: Health Behavior/Discharge Planning: Goal: Compliance with prescribed medication regimen will improve Outcome: Adequate for Discharge   Problem: Medication: Goal: Risk for medication side effects will decrease Outcome: Adequate for Discharge   Problem: Clinical Measurements: Goal: Complications related to the disease process, condition or treatment will be avoided or minimized Outcome: Adequate for Discharge Goal:  Diagnostic test results will improve Outcome: Adequate for Discharge   Problem: Safety: Goal: Verbalization of understanding the information provided will improve Outcome: Adequate for Discharge   Problem: Self-Concept: Goal: Level of anxiety will decrease Outcome: Adequate for Discharge Goal: Ability to verbalize feelings about condition will improve Outcome: Adequate for Discharge

## 2023-09-15 NOTE — Discharge Summary (Addendum)
 Physician Discharge Summary  Nancy [name] FMW:[id number] DOB: 2000-03-15 DOA: 09/14/2023  PCP: Shelley Lax, FNP  Admit date: 09/14/2023 Discharge date: 09/15/2023  Admitted From: home  Disposition:  home  Recommendations for Outpatient Follow-up:  Follow up with PCP in 1-2 weeks F/u w/ psychology/therapist ASAP F/u w/ endocrinology within 1 week  Home Health: no  Equipment/Devices:  Discharge Condition: stable  CODE STATUS: full  Diet recommendation: Carb Modified   Brief/Interim Summary: HPI was taken from Dr. Collie: Nancy [name] is a 23 y.o. female with medical history significant of thyrotoxicity(graves disease) ,  Psychogenic non-epileptic seizures,  insulin  dependent T2DM, gastroparesis, polysubstance abuse, chronic abdominal pain, conversion disorder, brought in for evaluation given seizures at home.  EMS reports they witnessed 3 tonic clonic seizures lasting approximately 2 minutes each en route, no bladder or bowel incontinence or tongue biting.  She was given versed  and droperidol .  Patient was lethargic during my encounter, falling asleep in between questions limiting her ability to provide history. She did report having around 10 seizures throughout the night at today. She also reported abdominal pain and headache in between falling asleep.     In the ED, she was in sinus tachycardia, otherwise afebrile, normotensive. WBC 15. TSH <0.11 with free T4 4.05.    Admission requested for observation    Addendum: Following admission, was called to bedside for new severe low back pain.   On my reassessment, patient was alert and oriented x 3, much more interactive than when seen previously.  She was in no distress and reports back pain is from  hitting her back on a washing machine while seizing.  On exam there is no red flag symptoms, no external evidence of trauma, some pain on palpation to the L-spine, negative straight leg raise bilaterally.  Will check L-spine x-ray.   Patient requesting IV pain medication.   Discharge Diagnoses:  Principal Problem:   Seizure-like activity (HCC) Active Problems:   Diabetic gastroparesis associated with type 2 diabetes mellitus (HCC)   Hyperthyroidism   Psychogenic nonepileptic seizure   Polysubstance abuse (HCC)  Functional neurological disorder: w/ hx of psychogenic non-epileptic seizures. Pt refused EEG. Will need to see psychology outpatient & pt was evidently seeing a therapist 3 times a month as per pt.    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 Will need f/u outpatient w/ endo. Continue on home dose of propranolol , methimazole . Will need to repeat thyroid  function tests outpatient   Marijuana use: urine drug screen was positive for marijuana and benzos. Received cessation counseling    DM2: well controlled, HbA1c 6.0. Continue on glargine, SSI w/ accuchecks   Hypokalemia: potassium given   Hx of gastroparesis: continue on reglan    Obesity: BMI 30.0. Would benefit from weight loss   Discharge Instructions  Discharge Instructions     Diet Carb Modified   Complete by: As directed    Discharge instructions   Complete by: As directed    F/u w/ psychologist/therapist as soon as possible. F/u w/ PCP in 1-2 weeks. F/u w/ endocrinology within 1 week.   Increase activity slowly   Complete by: As directed       Allergies as of 09/15/2023       Reactions   Acetaminophen  Other (See Comments)   Avoids due to liver   Ibuprofen  Other (See Comments)   GI MD said to not take this anymore        Medication List     TAKE these  medications    Baqsimi  Two Pack 3 MG/DOSE Powd Generic drug: Glucagon  Place 1 spray into the nose as needed.   Insulin  Aspart FlexPen 100 UNIT/ML Commonly known as: NOVOLOG  Inject 15 Units into the skin as directed. Sliding scale insulin  as follows: BG 150-200  = 2 units BG  200-250 = 4 units BG  250-300 = 6 units BG  300-350 = 8 units BG  350-400 = 10 units    insulin  glargine 100 unit/mL Sopn Commonly known as: LANTUS  Inject 8 Units into the skin 2 (two) times daily.   Lactulose  20 GM/30ML Soln Take 30 mLs (20 g total) by mouth 2 (two) times daily as needed.   methimazole  10 MG tablet Commonly known as: TAPAZOLE  Take 2 tablets (20 mg total) by mouth 2 (two) times daily.   metoCLOPramide  10 MG tablet Commonly known as: REGLAN  Take 1 tablet (10 mg total) by mouth 3 (three) times daily before meals.   oxyCODONE  5 MG immediate release tablet Commonly known as: Oxy IR/ROXICODONE  Take 1 tablet (5 mg total) by mouth every 6 (six) hours as needed for up to 3 days for severe pain (pain score 7-10) or moderate pain (pain score 4-6).   polyethylene glycol powder 17 GM/SCOOP powder Commonly known as: GLYCOLAX /MIRALAX  Take 17 g by mouth 2 (two) times daily as needed.   propranolol  80 MG tablet Commonly known as: INDERAL  Take 1 tablet (80 mg total) by mouth 3 (three) times daily.   QUEtiapine  100 MG tablet Commonly known as: SEROQUEL  Take 1 tablet (100 mg total) by mouth at bedtime as needed (insomnia).   senna-docusate 8.6-50 MG tablet Commonly known as: Senokot-S Take 2 tablets by mouth 2 (two) times daily.        Follow-up Information     Ramji, Amrita, FNP Follow up.   Specialty: Family Medicine Why: hospital follow up Contact information: [address] [facility] [address] [place] [address] [phone]                Allergies  Allergen Reactions   Acetaminophen  Other (See Comments)    Avoids due to liver   Ibuprofen  Other (See Comments)    GI MD said to not take this anymore    Consultations:    Procedures/Studies: DG Lumbar Spine 2-3 Views Result Date: 09/14/2023 CLINICAL DATA:  Back pain EXAM: LUMBAR SPINE - 2-3 VIEW COMPARISON:  None Available. FINDINGS: There is no evidence of lumbar spine fracture. Alignment is normal. Intervertebral disc spaces are maintained. IMPRESSION: Negative. Electronically  Signed   By: Franky Crease M.D.   On: 09/14/2023 19:32   (Echo, Carotid, EGD, Colonoscopy, ERCP)    Subjective: Pt c/o fatigue   Discharge Exam: Vitals:   09/15/23 0818 09/15/23 1216  BP: (!) 147/85 130/78  Pulse: 89 91  Resp: 18 18  Temp: (!) 97.5 F (36.4 C) 98 F (36.7 C)  SpO2: 100% 99%   Vitals:   09/15/23 0012 09/15/23 0416 09/15/23 0818 09/15/23 1216  BP: 97/81 130/76 (!) 147/85 130/78  Pulse: 95 97 89 91  Resp: 20 20 18 18   Temp: 98.4 F (36.9 C) 98.1 F (36.7 C) (!) 97.5 F (36.4 C) 98 F (36.7 C)  TempSrc:   Oral   SpO2: 100% 100% 100% 99%  Weight:      Height:        General: Pt is alert, awake, not in acute distress Cardiovascular:  S1/S2 +, no rubs, no gallops Respiratory: CTA bilaterally, no wheezing,  no rhonchi Abdominal: Soft, NT, ND, bowel sounds + Extremities:  no cyanosis    The results of significant diagnostics from this hospitalization (including imaging, microbiology, ancillary and laboratory) are listed below for reference.     Microbiology: No results found for this or any previous visit (from the past 240 hours).   Labs: BNP (last 3 results) No results for input(s): BNP in the last 8760 hours. Basic Metabolic Panel: Recent Labs  Lab 09/14/23 0849 09/15/23 0404  NA 135 138  K 2.9* 3.1*  CL 98 104  CO2 24 24  GLUCOSE 276* 132*  BUN 7 11  CREATININE 0.41* 0.41*  CALCIUM  9.4 9.3  MG 2.0  --    Liver Function Tests: Recent Labs  Lab 09/14/23 0849 09/15/23 0404  AST 24 40  ALT 22 33  ALKPHOS 84 73  BILITOT 1.6* 1.3*  PROT 7.3 6.7  ALBUMIN 4.0 3.7   No results for input(s): LIPASE, AMYLASE in the last 168 hours. No results for input(s): AMMONIA in the last 168 hours. CBC: Recent Labs  Lab 09/14/23 0849 09/15/23 0404  WBC 15.2* 12.8*  NEUTROABS 12.5*  --   HGB 12.8 12.0  HCT 39.4 37.2  MCV 74.3* 76.1*  PLT 302 275   Cardiac Enzymes: No results for input(s): CKTOTAL, CKMB, CKMBINDEX,  TROPONINI in the last 168 hours. BNP: Invalid input(s): POCBNP CBG: Recent Labs  Lab 09/14/23 0951 09/14/23 1534 09/14/23 2020 09/15/23 0819 09/15/23 1155  GLUCAP 241* 146* 207* 103* 132*   D-Dimer No results for input(s): DDIMER in the last 72 hours. Hgb A1c Recent Labs    09/14/23 0849  HGBA1C 6.0*   Lipid Profile No results for input(s): CHOL, HDL, LDLCALC, TRIG, CHOLHDL, LDLDIRECT in the last 72 hours. Thyroid  function studies Recent Labs    09/14/23 0849 09/14/23 1541  TSH <0.100*  --   T3FREE  --  18.2*   Anemia work up No results for input(s): VITAMINB12, FOLATE, FERRITIN, TIBC, IRON, RETICCTPCT in the last 72 hours. Urinalysis    Component Value Date/Time   COLORURINE YELLOW (A) 09/14/2023 1600   APPEARANCEUR HAZY (A) 09/14/2023 1600   LABSPEC 1.007 09/14/2023 1600   PHURINE 6.0 09/14/2023 1600   GLUCOSEU >=500 (A) 09/14/2023 1600   HGBUR NEGATIVE 09/14/2023 1600   BILIRUBINUR NEGATIVE 09/14/2023 1600   BILIRUBINUR negative 07/13/2018 1651   KETONESUR 5 (A) 09/14/2023 1600   PROTEINUR 30 (A) 09/14/2023 1600   UROBILINOGEN 0.2 07/13/2018 1651   UROBILINOGEN 1.0 07/09/2017 1324   NITRITE NEGATIVE 09/14/2023 1600   LEUKOCYTESUR NEGATIVE 09/14/2023 1600   Sepsis Labs Recent Labs  Lab 09/14/23 0849 09/15/23 0404  WBC 15.2* 12.8*   Microbiology No results found for this or any previous visit (from the past 240 hours).   Time coordinating discharge: 36 minutes  SIGNED:   Anthony CHRISTELLA Pouch, MD  [facility] 09/15/2023, 1:26 PM Pager   If 7PM-7AM, please contact night-coverage [email or web address]

## 2023-09-15 NOTE — Progress Notes (Signed)
 Pt refused EEG, documenting pt refusal in chart per MD request.

## 2023-09-15 NOTE — Procedures (Signed)
 Pt refused eeg

## 2023-09-15 NOTE — Plan of Care (Signed)
  Problem: Education: Goal: Ability to describe self-care measures that may prevent or decrease complications (Diabetes Survival Skills Education) will improve Outcome: Progressing Goal: Individualized Educational Video(s) Outcome: Progressing   Problem: Coping: Goal: Ability to adjust to condition or change in health will improve Outcome: Progressing   Problem: Fluid Volume: Goal: Ability to maintain a balanced intake and output will improve Outcome: Progressing   Problem: Metabolic: Goal: Ability to maintain appropriate glucose levels will improve Outcome: Progressing   Problem: Nutritional: Goal: Maintenance of adequate nutrition will improve Outcome: Progressing Goal: Progress toward achieving an optimal weight will improve Outcome: Progressing   Problem: Skin Integrity: Goal: Risk for impaired skin integrity will decrease Outcome: Progressing   Problem: Tissue Perfusion: Goal: Adequacy of tissue perfusion will improve Outcome: Progressing   Problem: Education: Goal: Knowledge of General Education information will improve Description: Including pain rating scale, medication(s)/side effects and non-pharmacologic comfort measures Outcome: Progressing   Problem: Health Behavior/Discharge Planning: Goal: Ability to manage health-related needs will improve Outcome: Progressing   Problem: Clinical Measurements: Goal: Ability to maintain clinical measurements within normal limits will improve Outcome: Progressing Goal: Will remain free from infection Outcome: Progressing Goal: Diagnostic test results will improve Outcome: Progressing Goal: Respiratory complications will improve Outcome: Progressing Goal: Cardiovascular complication will be avoided Outcome: Progressing   Problem: Nutrition: Goal: Adequate nutrition will be maintained Outcome: Progressing   Problem: Coping: Goal: Level of anxiety will decrease Outcome: Progressing   Problem: Elimination: Goal:  Will not experience complications related to bowel motility Outcome: Progressing Goal: Will not experience complications related to urinary retention Outcome: Progressing   Problem: Skin Integrity: Goal: Risk for impaired skin integrity will decrease Outcome: Progressing   Problem: Education: Goal: Expressions of having a comfortable level of knowledge regarding the disease process will increase Outcome: Progressing   Problem: Coping: Goal: Ability to adjust to condition or change in health will improve Outcome: Progressing Goal: Ability to identify appropriate support needs will improve Outcome: Progressing

## 2023-09-16 LAB — LAMOTRIGINE LEVEL: Lamotrigine Lvl: <1 ug/mL — ABNORMAL LOW (ref 2.0–20.0)

## 2023-11-13 ENCOUNTER — Encounter: Payer: Self-pay | Admitting: Emergency Medicine

## 2023-11-13 ENCOUNTER — Other Ambulatory Visit: Payer: Self-pay

## 2023-11-13 ENCOUNTER — Emergency Department: Admission: EM | Admit: 2023-11-13 | Discharge: 2023-11-13 | Disposition: A | Discharge status: Home / Self Care

## 2023-11-13 DIAGNOSIS — R569 Unspecified convulsions: Secondary | ICD-10-CM | POA: Insufficient documentation

## 2023-11-13 DIAGNOSIS — E119 Type 2 diabetes mellitus without complications: Secondary | ICD-10-CM | POA: Insufficient documentation

## 2023-11-13 DIAGNOSIS — R718 Other abnormality of red blood cells: Secondary | ICD-10-CM | POA: Insufficient documentation

## 2023-11-13 LAB — BLOOD GAS, VENOUS
Acid-Base Excess: 1.9 mmol/L (ref 0.0–2.0)
Bicarbonate: 26.6 mmol/L (ref 20.0–28.0)
O2 Saturation: 84.4 %
Patient temperature: 37
pCO2, Ven: 41 mmHg — ABNORMAL LOW (ref 44–60)
pH, Ven: 7.42 (ref 7.25–7.43)
pO2, Ven: 52 mmHg — ABNORMAL HIGH (ref 32–45)

## 2023-11-13 LAB — CBC WITH DIFFERENTIAL/PLATELET
Abs Immature Granulocytes: 0.01 K/uL (ref 0.00–0.07)
Basophils Absolute: 0 K/uL (ref 0.0–0.1)
Basophils Relative: 0 %
Eosinophils Absolute: 0.1 K/uL (ref 0.0–0.5)
Eosinophils Relative: 2 %
HCT: 33.5 % — ABNORMAL LOW (ref 36.0–46.0)
Hemoglobin: 10.8 g/dL — ABNORMAL LOW (ref 12.0–15.0)
Immature Granulocytes: 0 %
Lymphocytes Relative: 45 %
Lymphs Abs: 2.6 K/uL (ref 0.7–4.0)
MCH: 24.8 pg — ABNORMAL LOW (ref 26.0–34.0)
MCHC: 32.2 g/dL (ref 30.0–36.0)
MCV: 77 fL — ABNORMAL LOW (ref 80.0–100.0)
Monocytes Absolute: 0.8 K/uL (ref 0.1–1.0)
Monocytes Relative: 13 %
Neutro Abs: 2.3 K/uL (ref 1.7–7.7)
Neutrophils Relative %: 40 %
Platelets: 235 K/uL (ref 150–400)
RBC: 4.35 MIL/uL (ref 3.87–5.11)
RDW: 13.5 % (ref 11.5–15.5)
WBC: 5.7 K/uL (ref 4.0–10.5)
nRBC: 0 % (ref 0.0–0.2)

## 2023-11-13 LAB — LIPASE, BLOOD: Lipase: 28 U/L (ref 11–51)

## 2023-11-13 LAB — COMPREHENSIVE METABOLIC PANEL WITH GFR
ALT: 25 U/L (ref 0–44)
AST: 27 U/L (ref 15–41)
Albumin: 3.5 g/dL (ref 3.5–5.0)
Alkaline Phosphatase: 81 U/L (ref 38–126)
Anion gap: 12 (ref 5–15)
BUN: 9 mg/dL (ref 6–20)
CO2: 23 mmol/L (ref 22–32)
Calcium: 8.9 mg/dL (ref 8.9–10.3)
Chloride: 108 mmol/L (ref 98–111)
Creatinine, Ser: 0.39 mg/dL — ABNORMAL LOW (ref 0.44–1.00)
GFR, Estimated: >60 mL/min (ref >60)
Glucose, Bld: 168 mg/dL — ABNORMAL HIGH (ref 70–99)
Potassium: 3.7 mmol/L (ref 3.5–5.1)
Sodium: 143 mmol/L (ref 135–145)
Total Bilirubin: 0.5 mg/dL (ref 0.0–1.2)
Total Protein: 6.8 g/dL (ref 6.5–8.1)

## 2023-11-13 LAB — T4, FREE: Free T4: 2.6 ng/dL — ABNORMAL HIGH (ref 0.61–1.12)

## 2023-11-13 LAB — TSH: TSH: <0.1 u[IU]/mL — ABNORMAL LOW (ref 0.350–4.500)

## 2023-11-13 LAB — ETHANOL: Alcohol, Ethyl (B): <15 mg/dL (ref <15)

## 2023-11-13 MED ORDER — DROPERIDOL 2.5 MG/ML IJ SOLN
1.2500 mg | Freq: Once | INTRAMUSCULAR | Status: AC
  Administered 2023-11-13: 1.25 mg via INTRAVENOUS
  Filled 2023-11-13: qty 2

## 2023-11-13 MED ORDER — FAMOTIDINE IN NACL 20-0.9 MG/50ML-% IV SOLN
20.0000 mg | Freq: Once | INTRAVENOUS | Status: AC
  Administered 2023-11-13: 20 mg via INTRAVENOUS
  Filled 2023-11-13: qty 50

## 2023-11-13 MED ORDER — SODIUM CHLORIDE 0.9 % IV BOLUS
1000.0000 mL | Freq: Once | INTRAVENOUS | Status: AC
  Administered 2023-11-13: 1000 mL via INTRAVENOUS

## 2023-11-13 MED ORDER — LORAZEPAM 1 MG PO TABS
1.0000 mg | ORAL_TABLET | Freq: Once | ORAL | Status: AC
  Administered 2023-11-13: 1 mg via ORAL
  Filled 2023-11-13: qty 1

## 2023-11-13 MED ORDER — SUCRALFATE 1 G PO TABS
1.0000 g | ORAL_TABLET | Freq: Once | ORAL | Status: AC
  Administered 2023-11-13: 1 g via ORAL
  Filled 2023-11-13: qty 1

## 2023-11-13 MED ORDER — PROPRANOLOL HCL 20 MG PO TABS
80.0000 mg | ORAL_TABLET | Freq: Once | ORAL | Status: AC
  Administered 2023-11-13: 80 mg via ORAL
  Filled 2023-11-13: qty 4

## 2023-11-13 NOTE — ED Triage Notes (Signed)
 Pt to ER via EMS from home with reports of multiple seizures this AM.  Pt reports taking medications this AM.  Per EMS pt had brief shaking episode en route and has been sleepy since that time, pt arrive alert and oriented.  Dr. Nicholaus to room for assessment.

## 2023-11-13 NOTE — Discharge Instructions (Signed)
 Continue to take your regular medications.  Please follow-up with your primary care physician as soon as possible.  Return if any acutely worsening symptoms or any other emergency. -- RETURN PRECAUTIONS & AFTERCARE: (ENGLISH) RETURN PRECAUTIONS: Return immediately to the emergency department or see/call your doctor if you feel worse, weak or have changes in speech or vision, are short of breath, have fever, vomiting, pain, bleeding or dark stool, trouble urinating or any new issues. Return here or see/call your doctor if not improving as expected for your suspected condition. FOLLOW-UP CARE: Call your doctor and/or any doctors we referred you to for more advice and to make an appointment. Do this today, tomorrow or after the weekend. Some doctors only take PPO insurance so if you have HMO insurance you may want to contact your HMO or your regular doctor for referral to a specialist within your plan. Either way tell the doctor's office that it was a referral from the emergency department so you get the soonest possible appointment.  YOUR TEST RESULTS: Take result reports of any blood or urine tests, imaging tests and EKG's to your doctor and any referral doctor. Have any abnormal tests repeated. Your doctor or a referral doctor can let you know when this should be done. Also make sure your doctor contacts this hospital to get any test results that are not currently available such as cultures or special tests for infection and final imaging reports, which are often not available at the time you leave the ER but which may list additional important findings that are not documented on the preliminary report. BLOOD PRESSURE: If your blood pressure was greater than 120/80 have your blood pressure rechecked within 1 to 2 weeks. MEDICATION SIDE EFFECTS: Do not drive, walk, bike, take the bus, etc. if you have received or are being prescribed any sedating medications such as those for pain or anxiety or certain  antihistamines like Benadryl . If you have been give one of these here get a taxi home or have a friend drive you home. Ask your pharmacist to counsel you on potential side effects of any new medication

## 2023-11-13 NOTE — ED Notes (Addendum)
 Pt stated her ride was here and pt was walked out to front door of ED lobby. Pt stated she did not want a wheelchair and was okay with walking out.

## 2023-11-13 NOTE — ED Provider Notes (Signed)
 [facility] Provider Note    Event Date/Time   First MD Initiated Contact with Patient 11/13/23 [phone]     (approximate)   History   Seizures   HPI  Nancy [name] is a 23 y.o. female  with pmh of insulin  dependent T2DM, gastroparesis, polysubstance abuse, chronic abdominal pain, conversion disorder, brought in for evaluation given seizures at home.  Patient states that she has had multiple seizure-like events over the past several days.  Denies any head strike.  Denies any headache.  She reports worsening abdominal pain but is unchanged from previous presentations.  She tells me that she has compliance with all of her medications.  Denies any fevers or chills cough congestion or chest pain.  No SI HI or AVHs.  Patient requests a work note     Physical Exam   Triage Vital Signs: ED Triage Vitals  Encounter Vitals Group     BP      Girls Systolic BP Percentile      Girls Diastolic BP Percentile      Boys Systolic BP Percentile      Boys Diastolic BP Percentile      Pulse      Resp      Temp      Temp src      SpO2      Weight      Height      Head Circumference      Peak Flow      Pain Score      Pain Loc      Pain Education      Exclude from Growth Chart     Most recent vital signs: Vitals:   11/13/23 0855 11/13/23 1126  BP:  134/76  Pulse:  (!) 122  Resp:  20  Temp: 97.9 F (36.6 C)   SpO2:  100%    Nursing Triage Note reviewed. Vital signs reviewed and patients oxygen saturation is normoxic  General: Patient is well nourished, well developed, awake and alert, resting comfortably in no acute distress Head: Normocephalic and atraumatic Eyes: Normal inspection, extraocular muscles intact, no conjunctival pallor Ear, nose, throat: Normal external exam Neck: Normal range of motion Respiratory: Patient is in no respiratory distress, lungs CTAB Cardiovascular: Patient is not tachycardic, RRR without murmur appreciated GI: Abd SNT  with no guarding or rebound  Back: Normal inspection of the back with good strength and range of motion throughout all ext Extremities: pulses intact with good cap refills, no LE pitting edema or calf tenderness Neuro: The patient is alert and oriented to person, place, and time, appropriately conversive, with 5/5 bilat UE/LE strength, no gross motor or sensory defects noted. Coordination appears to be adequate. Skin: Warm, dry, and intact Psych: normal mood and affect, no SI or HI  ED Results / Procedures / Treatments   Labs (all labs ordered are listed, but only abnormal results are displayed) Labs Reviewed  CBC WITH DIFFERENTIAL/PLATELET - Abnormal; Notable for the following components:      Result Value   Hemoglobin 10.8 (*)    HCT 33.5 (*)    MCV 77.0 (*)    MCH 24.8 (*)    All other components within normal limits  COMPREHENSIVE METABOLIC PANEL WITH GFR - Abnormal; Notable for the following components:   Glucose, Bld 168 (*)    Creatinine, Ser 0.39 (*)    All other components within normal limits  BLOOD GAS, VENOUS - Abnormal; Notable  for the following components:   pCO2, Ven 41 (*)    pO2, Ven 52 (*)    All other components within normal limits  TSH - Abnormal; Notable for the following components:   TSH <0.100 (*)    All other components within normal limits  T4, FREE - Abnormal; Notable for the following components:   Free T4 2.60 (*)    All other components within normal limits  LIPASE, BLOOD  ETHANOL  URINALYSIS, COMPLETE (UACMP) WITH MICROSCOPIC  URINE DRUG SCREEN, QUALITATIVE ([facility] ONLY)     EKG EKG and rhythm strip are interpreted by myself:   EKG: tachycardic sinus rhythm at heart rate of 106, normal QRS duration, QTc 456, nonspecific ST segments and T waves no ectopy EKG not consistent with Acute STEMI Rhythm strip: tachycardic sinus in lead II   RADIOLOGY None    PROCEDURES:  Critical Care performed: No  Procedures   MEDICATIONS ORDERED IN  ED: Medications  sodium chloride  0.9 % bolus 1,000 mL (0 mLs Intravenous Stopped 11/13/23 1140)  propranolol  (INDERAL ) tablet 80 mg (80 mg Oral Given 11/13/23 0838)  sucralfate  (CARAFATE ) tablet 1 g (1 g Oral Given by Other 11/13/23 1022)  famotidine  (PEPCID ) IVPB 20 mg premix (0 mg Intravenous Stopped 11/13/23 1024)  droperidol  (INAPSINE ) 2.5 MG/ML injection 1.25 mg (1.25 mg Intravenous Given 11/13/23 1023)  LORazepam  (ATIVAN ) tablet 1 mg (1 mg Oral Given 11/13/23 1022)     IMPRESSION / MDM / ASSESSMENT AND PLAN / ED COURSE                                Differential diagnosis includes, but is not limited to: Nonepileptic seizure events, medicine noncompliance, thyroid  abnormality, electrolyte derangement anemia pregnancy alcohol use   ED course: Patient presents and she is tachycardic however she is always tachycardic per chart review.  I did administer her home propranolol  which she reportedly did not take this morning.  She had no acidosis no leukocytosis or electrolyte derangements.  She had no elevation of her LFTs.  Her thyroid  stimulating number was less than 0.100 however her free T4 was 2.60.  This is within her baseline and I do not think she is thyrotoxic today.  I did attempt to get a urine pregnancy and urine drug screen and urinalysis on the patient however she did urinate without catching it; and I was suddenly not hold her heel to obtain another 1.  She is advised to follow-up with her primary care physician.  She feels comfortable returning home.  I did provide the work note that she requested and went over seizure precautions including no driving and operating machinery   Clinical Course as of 11/13/23 1601  Fri Nov 13, 2023  0905 CBC with Differential(!) No leukocytosis [HD]  0906 HCT(!): 33.5 Slightly less than usual but has been this way in the past [HD]  0906 Blood gas, venous(!) No acidosis [HD]  0939 Comprehensive metabolic panel(!) No elevation of liver function  tests or lipase [HD]  0943 Anion gap: 12 No anion gap [HD]  1018 Patient previously walking holding her stomach per nursing when I went and patient was sitting upright texting.  I reviewed her workup and she requests a work note today.  Awaiting urine.  Will order her a little bit of medication for her abdominal pain [HD]  1108 Patient urinated but it was not corrected.  Would not hold her heel.  She was ambulating taking p.o. feels comfortable returning home [HD]    Clinical Course User Index [HD] Nicholaus Rolland BRAVO, MD   At time of discharge there is no evidence of acute life, limb, vision, or fertility threat. Patient has stable vital signs, pain is well controlled, patient is ambulatory and p.o. tolerant.  Discharge instructions were completed using the EPIC system. I would refer you to those at this time. All warnings prescriptions follow-up etc. were discussed in detail with the patient. Patient indicates understanding and is agreeable with this plan. All questions answered.  Patient is made aware that they may return to the emergency department for any worsening or new condition or for any other emergency.  -- Risk: 5 This patient has a high risk of morbidity due to further diagnostic testing or treatment. Rationale: This patient's evaluation and management involve a high risk of morbidity due to the potential severity of presenting symptoms, need for diagnostic testing, and/or initiation of treatment that may require close monitoring. The differential includes conditions with potential for significant deterioration or requiring escalation of care. Treatment decisions in the ED, including medication administration, procedural interventions, or disposition planning, reflect this level of risk. COPA: 5 The patient has the following acute or chronic illness/injury that poses a possible threat to life or bodily function: [X] : The patient has a potentially serious acute condition or an acute  exacerbation of a chronic illness requiring urgent evaluation and management in the Emergency Department. The clinical presentation necessitates immediate consideration of life-threatening or function-threatening diagnoses, even if they are ultimately ruled out.   FINAL CLINICAL IMPRESSION(S) / ED DIAGNOSES   Final diagnoses:  Seizure-like activity (HCC)     Rx / DC Orders   ED Discharge Orders     None        Note:  This document was prepared using Dragon voice recognition software and may include unintentional dictation errors.   Nicholaus Rolland BRAVO, MD 11/13/23 [phone]

## 2023-11-20 ENCOUNTER — Inpatient Hospital Stay

## 2023-11-20 ENCOUNTER — Emergency Department

## 2023-11-20 ENCOUNTER — Other Ambulatory Visit: Payer: Self-pay

## 2023-11-20 ENCOUNTER — Inpatient Hospital Stay
Admission: EM | Admit: 2023-11-20 | Discharge: 2023-11-23 | DRG: 644 | Disposition: A | Discharge status: Home / Self Care | Attending: Internal Medicine | Admitting: Internal Medicine

## 2023-11-20 DIAGNOSIS — E0581 Other thyrotoxicosis with thyrotoxic crisis or storm: Principal | ICD-10-CM | POA: Diagnosis present

## 2023-11-20 DIAGNOSIS — E0590 Thyrotoxicosis, unspecified without thyrotoxic crisis or storm: Secondary | ICD-10-CM | POA: Diagnosis present

## 2023-11-20 DIAGNOSIS — G8929 Other chronic pain: Secondary | ICD-10-CM | POA: Diagnosis present

## 2023-11-20 DIAGNOSIS — Z8049 Family history of malignant neoplasm of other genital organs: Secondary | ICD-10-CM | POA: Diagnosis not present

## 2023-11-20 DIAGNOSIS — I2489 Other forms of acute ischemic heart disease: Secondary | ICD-10-CM | POA: Diagnosis present

## 2023-11-20 DIAGNOSIS — E1169 Type 2 diabetes mellitus with other specified complication: Secondary | ICD-10-CM

## 2023-11-20 DIAGNOSIS — Z888 Allergy status to other drugs, medicaments and biological substances status: Secondary | ICD-10-CM

## 2023-11-20 DIAGNOSIS — Z825 Family history of asthma and other chronic lower respiratory diseases: Secondary | ICD-10-CM | POA: Diagnosis not present

## 2023-11-20 DIAGNOSIS — I509 Heart failure, unspecified: Secondary | ICD-10-CM | POA: Diagnosis present

## 2023-11-20 DIAGNOSIS — Z6830 Body mass index (BMI) 30.0-30.9, adult: Secondary | ICD-10-CM | POA: Diagnosis not present

## 2023-11-20 DIAGNOSIS — Z79899 Other long term (current) drug therapy: Secondary | ICD-10-CM

## 2023-11-20 DIAGNOSIS — Z794 Long term (current) use of insulin: Secondary | ICD-10-CM | POA: Diagnosis not present

## 2023-11-20 DIAGNOSIS — Z8249 Family history of ischemic heart disease and other diseases of the circulatory system: Secondary | ICD-10-CM

## 2023-11-20 DIAGNOSIS — E1043 Type 1 diabetes mellitus with diabetic autonomic (poly)neuropathy: Principal | ICD-10-CM | POA: Diagnosis present

## 2023-11-20 DIAGNOSIS — K3184 Gastroparesis: Secondary | ICD-10-CM | POA: Diagnosis present

## 2023-11-20 DIAGNOSIS — R55 Syncope and collapse: Secondary | ICD-10-CM | POA: Diagnosis present

## 2023-11-20 DIAGNOSIS — Z833 Family history of diabetes mellitus: Secondary | ICD-10-CM

## 2023-11-20 DIAGNOSIS — Z91148 Patient's other noncompliance with medication regimen for other reason: Secondary | ICD-10-CM | POA: Diagnosis not present

## 2023-11-20 DIAGNOSIS — R569 Unspecified convulsions: Secondary | ICD-10-CM | POA: Diagnosis present

## 2023-11-20 DIAGNOSIS — Z800 Family history of malignant neoplasm of digestive organs: Secondary | ICD-10-CM | POA: Diagnosis not present

## 2023-11-20 DIAGNOSIS — F17290 Nicotine dependence, other tobacco product, uncomplicated: Secondary | ICD-10-CM | POA: Diagnosis present

## 2023-11-20 DIAGNOSIS — F419 Anxiety disorder, unspecified: Secondary | ICD-10-CM | POA: Diagnosis present

## 2023-11-20 DIAGNOSIS — E669 Obesity, unspecified: Secondary | ICD-10-CM | POA: Diagnosis present

## 2023-11-20 DIAGNOSIS — Z7982 Long term (current) use of aspirin: Secondary | ICD-10-CM | POA: Diagnosis not present

## 2023-11-20 DIAGNOSIS — D72829 Elevated white blood cell count, unspecified: Secondary | ICD-10-CM | POA: Diagnosis present

## 2023-11-20 DIAGNOSIS — F32A Depression, unspecified: Secondary | ICD-10-CM | POA: Diagnosis present

## 2023-11-20 DIAGNOSIS — Z83438 Family history of other disorder of lipoprotein metabolism and other lipidemia: Secondary | ICD-10-CM | POA: Diagnosis not present

## 2023-11-20 DIAGNOSIS — I5A Non-ischemic myocardial injury (non-traumatic): Secondary | ICD-10-CM | POA: Diagnosis present

## 2023-11-20 DIAGNOSIS — Z1152 Encounter for screening for COVID-19: Secondary | ICD-10-CM

## 2023-11-20 DIAGNOSIS — E119 Type 2 diabetes mellitus without complications: Secondary | ICD-10-CM | POA: Diagnosis not present

## 2023-11-20 DIAGNOSIS — E1143 Type 2 diabetes mellitus with diabetic autonomic (poly)neuropathy: Secondary | ICD-10-CM | POA: Diagnosis present

## 2023-11-20 LAB — CBC
HCT: 40 % (ref 36.0–46.0)
Hemoglobin: 13.4 g/dL (ref 12.0–15.0)
MCH: 25 pg — ABNORMAL LOW (ref 26.0–34.0)
MCHC: 33.5 g/dL (ref 30.0–36.0)
MCV: 74.6 fL — ABNORMAL LOW (ref 80.0–100.0)
Platelets: 251 K/uL (ref 150–400)
RBC: 5.36 MIL/uL — ABNORMAL HIGH (ref 3.87–5.11)
RDW: 13.3 % (ref 11.5–15.5)
WBC: 12.4 K/uL — ABNORMAL HIGH (ref 4.0–10.5)
nRBC: 0 % (ref 0.0–0.2)

## 2023-11-20 LAB — RESP PANEL BY RT-PCR (RSV, FLU A&B, COVID)  RVPGX2
Influenza A by PCR: NEGATIVE
Influenza B by PCR: NEGATIVE
Resp Syncytial Virus by PCR: NEGATIVE
SARS Coronavirus 2 by RT PCR: NEGATIVE

## 2023-11-20 LAB — BASIC METABOLIC PANEL WITH GFR
Anion gap: 11 (ref 5–15)
BUN: 10 mg/dL (ref 6–20)
CO2: 25 mmol/L (ref 22–32)
Calcium: 9.4 mg/dL (ref 8.9–10.3)
Chloride: 100 mmol/L (ref 98–111)
Creatinine, Ser: 0.58 mg/dL (ref 0.44–1.00)
GFR, Estimated: >60 mL/min (ref >60)
Glucose, Bld: 227 mg/dL — ABNORMAL HIGH (ref 70–99)
Potassium: 3.5 mmol/L (ref 3.5–5.1)
Sodium: 136 mmol/L (ref 135–145)

## 2023-11-20 LAB — TROPONIN T, HIGH SENSITIVITY
Troponin T High Sensitivity: 85 ng/L — ABNORMAL HIGH (ref 0–19)
Troponin T High Sensitivity: 70 ng/L — ABNORMAL HIGH (ref 0–19)

## 2023-11-20 LAB — T4, FREE: Free T4: 4.61 ng/dL — ABNORMAL HIGH (ref 0.61–1.12)

## 2023-11-20 LAB — TSH: TSH: <0.1 u[IU]/mL — ABNORMAL LOW (ref 0.350–4.500)

## 2023-11-20 MED ORDER — PROPRANOLOL HCL 20 MG PO TABS: 40.0000 mg | ORAL_TABLET | Freq: Once | ORAL | Status: DC

## 2023-11-20 MED ORDER — HYDRALAZINE HCL 20 MG/ML IJ SOLN
5.0000 mg | INTRAMUSCULAR | Status: DC | PRN
  Administered 2023-11-22 – 2023-11-23 (×2): 5 mg via INTRAVENOUS
  Filled 2023-11-20 (×2): qty 1

## 2023-11-20 MED ORDER — DICYCLOMINE HCL 10 MG PO CAPS
10.0000 mg | ORAL_CAPSULE | Freq: Once | ORAL | Status: AC
  Administered 2023-11-20: 10 mg via ORAL
  Filled 2023-11-20: qty 1

## 2023-11-20 MED ORDER — SODIUM CHLORIDE 0.9 % IV BOLUS
1000.0000 mL | Freq: Once | INTRAVENOUS | Status: AC
  Administered 2023-11-20: 1000 mL via INTRAVENOUS

## 2023-11-20 MED ORDER — METHIMAZOLE 10 MG PO TABS
40.0000 mg | ORAL_TABLET | Freq: Two times a day (BID) | ORAL | Status: DC
  Administered 2023-11-21 – 2023-11-23 (×5): 40 mg via ORAL
  Filled 2023-11-20 (×5): qty 4

## 2023-11-20 MED ORDER — ONDANSETRON HCL 4 MG/2ML IJ SOLN: 4.0000 mg | Freq: Three times a day (TID) | INTRAMUSCULAR | Status: DC | PRN

## 2023-11-20 MED ORDER — METHIMAZOLE 10 MG PO TABS
30.0000 mg | ORAL_TABLET | Freq: Once | ORAL | Status: AC
  Administered 2023-11-20: 30 mg via ORAL
  Filled 2023-11-20: qty 3

## 2023-11-20 MED ORDER — PROPRANOLOL HCL 40 MG PO TABS
80.0000 mg | ORAL_TABLET | Freq: Three times a day (TID) | ORAL | Status: DC
  Administered 2023-11-21 – 2023-11-23 (×8): 80 mg via ORAL
  Filled 2023-11-20 (×2): qty 2
  Filled 2023-11-20: qty 4
  Filled 2023-11-20 (×2): qty 2
  Filled 2023-11-20: qty 4
  Filled 2023-11-20 (×3): qty 2

## 2023-11-20 MED ORDER — SODIUM CHLORIDE 0.9 % IV BOLUS: 2000.0000 mL | Freq: Once | INTRAVENOUS | Status: DC

## 2023-11-20 MED ORDER — KETOROLAC TROMETHAMINE 30 MG/ML IJ SOLN
15.0000 mg | Freq: Once | INTRAMUSCULAR | Status: AC
  Administered 2023-11-20: 15 mg via INTRAVENOUS
  Filled 2023-11-20: qty 1

## 2023-11-20 MED ORDER — PROPRANOLOL HCL 20 MG PO TABS
40.0000 mg | ORAL_TABLET | Freq: Once | ORAL | Status: AC
  Administered 2023-11-20: 40 mg via ORAL
  Filled 2023-11-20: qty 2

## 2023-11-20 MED ORDER — SODIUM CHLORIDE 0.9 % IV SOLN: INTRAVENOUS | Status: DC

## 2023-11-20 MED ORDER — LACTATED RINGERS IV BOLUS
1000.0000 mL | Freq: Once | INTRAVENOUS | Status: AC
  Administered 2023-11-20: 1000 mL via INTRAVENOUS

## 2023-11-20 MED ORDER — DROPERIDOL 2.5 MG/ML IJ SOLN
2.5000 mg | Freq: Once | INTRAMUSCULAR | Status: AC
  Administered 2023-11-20: 2.5 mg via INTRAVENOUS
  Filled 2023-11-20: qty 2

## 2023-11-20 MED ORDER — HYDROCORTISONE SOD SUC (PF) 100 MG IJ SOLR
100.0000 mg | Freq: Two times a day (BID) | INTRAMUSCULAR | Status: DC
  Administered 2023-11-20 – 2023-11-21 (×2): 100 mg via INTRAVENOUS
  Filled 2023-11-20 (×2): qty 2

## 2023-11-20 MED ORDER — METHIMAZOLE 10 MG PO TABS
10.0000 mg | ORAL_TABLET | Freq: Once | ORAL | Status: AC
  Administered 2023-11-20: 10 mg via ORAL
  Filled 2023-11-20: qty 1

## 2023-11-20 NOTE — H&P (Incomplete)
 History and Physical    PRETTY WELTMAN FMW:[id number] DOB: November 17, 2000 DOA: 11/20/2023  Referring MD/NP/PA:   PCP: Shelley Lax, FNP   Patient coming from:  The patient is coming from home.     Chief Complaint: Near syncope  HPI: Nancy [name] is a 23 y.o. female with medical history significant of  thyrotoxicity(graves disease) ,  psychogenic non-epileptic seizures,  insulin  dependent T2DM, gastroparesis, polysubstance abuse, chronic abdominal pain, conversion disorder, who presents with near syncope.   Pt states that she has had several episodes of near syncope in the past several days,  feeling that she is going to pass out, but she is not very sure if she truly passed out or not. No unilateral numbness or tingling in extremities.  No facial droop or slurred speech.  She has front chest discomfort and palpitation.  Her heart rate is up to 140s in ED.  No SOB.  She has nasal congestion and a mild dry cough.  No fever or chills.  She has nausea and chronic diffuse abdominal pain, no vomiting or diarrhea.  No symptoms of UTI.  She states that she is taking her thyroid  medication, but admited missing some doses.  Data reviewed independently and ED Course: pt was found to have TSH < 0.100, Free T4=4.61, WBC 12.4, GFR> 60, negative PCR for COVID, flu and RSV, troponin 85- > 70, chest x-ray negative.  Patient is admitted to stepdown as inpatient.  EKG: I have personally reviewed.  Sinus rhythm, QTc 456, heart rate of 149, LAE.   Review of Systems:   General: no fevers, chills, no body weight gain, has fatigue HEENT: no blurry vision, hearing changes or sore throat Respiratory: no dyspnea, has coughing, no wheezing CV: has chest discomfort and palpitations GI: Has nausea and abdominal pain, no vomiting or diarrhea. GU: no dysuria, burning on urination, increased urinary frequency, hematuria  Ext: no leg edema Neuro: no unilateral weakness, numbness, or tingling, no vision change or  hearing loss Skin: no rash, no skin tear. MSK: No muscle spasm, no deformity, no limitation of range of movement in spin Heme: No easy bruising.  Travel history: No recent long distant travel.   Allergy:  Allergies  Allergen Reactions  . Acetaminophen  Other (See Comments)    Avoids due to liver  . Ibuprofen  Other (See Comments)    GI MD said to not take this anymore    Past Medical History:  Diagnosis Date  . Acanthosis nigricans   . Anxiety   . CHF (congestive heart failure) (HCC)   . Chronic lower back pain   . Depression   . DKA, type 1 (HCC) 09/13/2018  . Dyspepsia   . Obesity   . Ovarian cyst    pt is not aware of this hx (11/24/2017)  . Precocious adrenarche   . Premature baby   . Seizures (HCC)   . Type II diabetes mellitus (HCC)    insulin  dependant    Past Surgical History:  Procedure Laterality Date  . ABDOMINAL HERNIA REPAIR     I was a baby  . BIOPSY  10/12/2018   Procedure: BIOPSY;  Surgeon: Charlanne Groom, MD;  Location: [facility] ENDOSCOPY;  Service: Endoscopy;;  . BIOPSY  02/28/2020   Procedure: BIOPSY;  Surgeon: San Sandor GAILS, DO;  Location: [facility] ENDOSCOPY;  Service: Gastroenterology;;  . ESOPHAGOGASTRODUODENOSCOPY (EGD) WITH PROPOFOL  N/A 10/12/2018   Procedure: ESOPHAGOGASTRODUODENOSCOPY (EGD) WITH PROPOFOL ;  Surgeon: Charlanne Groom, MD;  Location: [facility] ENDOSCOPY;  Service: Endoscopy;  Laterality: N/A;  . ESOPHAGOGASTRODUODENOSCOPY (EGD) WITH PROPOFOL  N/A 02/28/2020   Procedure: ESOPHAGOGASTRODUODENOSCOPY (EGD) WITH PROPOFOL ;  Surgeon: San Sandor GAILS, DO;  Location: [facility] ENDOSCOPY;  Service: Gastroenterology;  Laterality: N/A;  . FLEXIBLE SIGMOIDOSCOPY N/A 02/28/2020   Procedure: FLEXIBLE SIGMOIDOSCOPY;  Surgeon: San Sandor GAILS, DO;  Location: [facility] ENDOSCOPY;  Service: Gastroenterology;  Laterality: N/A;  . HERNIA REPAIR    . LEFT HEART CATH AND CORONARY ANGIOGRAPHY N/A 10/13/2018   Procedure: LEFT HEART CATH AND CORONARY ANGIOGRAPHY;  Surgeon: Verlin Lonni BIRCH, MD;  Location: [facility] INVASIVE CV LAB;  Service: Cardiovascular;  Laterality: N/A;  . TONSILLECTOMY AND ADENOIDECTOMY    . WISDOM TOOTH EXTRACTION  2017    Social History:  reports that she has been smoking e-cigarettes. She has never used smokeless tobacco. She reports that she does not drink alcohol and does not use drugs.  Family History:  Family History  Problem Relation Age of Onset  . Diabetes Mother   . Hypertension Mother   . Obesity Mother   . Asthma Mother   . Allergic rhinitis Mother   . Eczema Mother   . Cervical cancer Mother   . Diabetes Father   . Hypertension Father   . Obesity Father   . Hyperlipidemia Father   . Hypertension Paternal Aunt   . Hypertension Maternal Grandfather   . Colon cancer Maternal Grandfather   . Diabetes Paternal Grandmother   . Obesity Paternal Grandmother   . Diabetes Paternal Grandfather   . Obesity Paternal Grandfather   . Angioedema Neg Hx   . Immunodeficiency Neg Hx   . Urticaria Neg Hx   . Stomach cancer Neg Hx   . Esophageal cancer Neg Hx      Prior to Admission medications   Medication Sig Start Date End Date Taking? Authorizing Provider  Glucagon  (BAQSIMI  TWO PACK) 3 MG/DOSE POWD Place 1 spray into the nose as needed. Patient not taking: Reported on 09/14/2023 08/23/21   Wouk, Devaughn Sayres, MD  Insulin  Aspart FlexPen (NOVOLOG ) 100 UNIT/ML Inject 15 Units into the skin as directed. Sliding scale insulin  as follows: BG 150-200  = 2 units BG  200-250 = 4 units BG  250-300 = 6 units BG  300-350 = 8 units BG  350-400 = 10 units Patient not taking: Reported on 09/14/2023 01/09/23   [provider]  insulin  glargine (LANTUS ) 100 unit/mL SOPN Inject 8 Units into the skin 2 (two) times daily. Patient not taking: Reported on 09/14/2023 05/25/23   Alexander, Natalie, DO  Lactulose  20 GM/30ML SOLN Take 30 mLs (20 g total) by mouth 2 (two) times daily as needed. Patient not taking: Reported on 09/14/2023 07/02/23   Laurita Pillion, MD  methimazole  (TAPAZOLE ) 10 MG tablet Take 2 tablets (20 mg total) by mouth 2 (two) times daily. Patient not taking: Reported on 09/14/2023 10/05/21   Awanda City, MD  metoCLOPramide  (REGLAN ) 10 MG tablet Take 1 tablet (10 mg total) by mouth 3 (three) times daily before meals. 06/06/23 07/06/23  Alexander, Natalie, DO  polyethylene glycol powder (GLYCOLAX /MIRALAX ) 17 GM/SCOOP powder Take 17 g by mouth 2 (two) times daily as needed. Patient not taking: Reported on 09/14/2023 07/02/23   Laurita Pillion, MD  propranolol  (INDERAL ) 80 MG tablet Take 1 tablet (80 mg total) by mouth 3 (three) times daily. Patient not taking: Reported on 09/14/2023 05/25/23   Alexander, Natalie, DO  QUEtiapine  (SEROQUEL ) 100 MG tablet Take 1 tablet (100 mg total) by mouth at bedtime as needed (  insomnia). Patient not taking: Reported on 09/14/2023 06/06/23   Alexander, Natalie, DO  senna-docusate (SENOKOT-S) 8.6-50 MG tablet Take 2 tablets by mouth 2 (two) times daily. Patient not taking: Reported on 09/14/2023 07/02/23   Laurita Pillion, MD  prochlorperazine  (COMPAZINE ) 25 MG suppository PLACE 1 SUPPOSITORY (25 MG TOTAL) RECTALLY EVERY TWELVE HOURS AS NEEDED FOR NAUSEA OR VOMITING. 02/29/20 02/29/20  Raenelle Donalda HERO, MD    Physical Exam: Vitals:   11/20/23 2100 11/20/23 2130 11/20/23 2200 11/20/23 2310  BP: 124/83 111/78 123/69 (!) 101/56  Pulse: (!) 138 (!) 126 (!) 131 (!) 107  Resp: 17 (!) 28 (!) 25 20  Temp:      TempSrc:      SpO2: 100% 100% 100% 100%  Weight:      Height:       General: Not in acute distress HEENT:       Eyes: PERRL, EOMI, no jaundice       ENT: No discharge from the ears and nose, no pharynx injection, no tonsillar enlargement.        Neck: No JVD, no bruit, no mass felt. Heme: No neck lymph node enlargement. Cardiac: S1/S2, RRR, tachycardia, no murmurs, No gallops or rubs. Respiratory: No rales, wheezing, rhonchi or rubs. GI: Soft, nondistended, has mild diffused abdominal tenderness, no rebound  pain, no organomegaly, BS present. GU: No hematuria Ext: No pitting leg edema bilaterally. 1+DP/PT pulse bilaterally. Musculoskeletal: No joint deformities, No joint redness or warmth, no limitation of ROM in spin. Skin: No rashes.  Neuro: Lethargic, easily arousable, oriented X3, cranial nerves II-XII grossly intact, moves all extremities normally. Psych: Patient is not psychotic, no suicidal or hemocidal ideation.  Labs on Admission: I have personally reviewed following labs and imaging studies  CBC: Recent Labs  Lab 11/20/23 1939  WBC 12.4*  HGB 13.4  HCT 40.0  MCV 74.6*  PLT 251   Basic Metabolic Panel: Recent Labs  Lab 11/20/23 1939  NA 136  K 3.5  CL 100  CO2 25  GLUCOSE 227*  BUN 10  CREATININE 0.58  CALCIUM  9.4   GFR: Estimated Creatinine Clearance: 106.2 mL/min (by C-G formula based on SCr of 0.58 mg/dL). Liver Function Tests: No results for input(s): AST, ALT, ALKPHOS, BILITOT, PROT, ALBUMIN in the last 168 hours. No results for input(s): LIPASE, AMYLASE in the last 168 hours. No results for input(s): AMMONIA in the last 168 hours. Coagulation Profile: No results for input(s): INR, PROTIME in the last 168 hours. Cardiac Enzymes: No results for input(s): CKTOTAL, CKMB, CKMBINDEX, TROPONINI in the last 168 hours. BNP (last 3 results) No results for input(s): PROBNP in the last 8760 hours. HbA1C: No results for input(s): HGBA1C in the last 72 hours. CBG: No results for input(s): GLUCAP in the last 168 hours. Lipid Profile: No results for input(s): CHOL, HDL, LDLCALC, TRIG, CHOLHDL, LDLDIRECT in the last 72 hours. Thyroid  Function Tests: Recent Labs    11/20/23 1939  TSH <0.100*  FREET4 4.61*   Anemia Panel: No results for input(s): VITAMINB12, FOLATE, FERRITIN, TIBC, IRON, RETICCTPCT in the last 72 hours. Urine analysis:    Component Value Date/Time   COLORURINE YELLOW (A) 09/14/2023 1600    APPEARANCEUR HAZY (A) 09/14/2023 1600   LABSPEC 1.007 09/14/2023 1600   PHURINE 6.0 09/14/2023 1600   GLUCOSEU >=500 (A) 09/14/2023 1600   HGBUR NEGATIVE 09/14/2023 1600   BILIRUBINUR NEGATIVE 09/14/2023 1600   BILIRUBINUR negative 07/13/2018 1651   KETONESUR 5 (A) 09/14/2023 1600  PROTEINUR 30 (A) 09/14/2023 1600   UROBILINOGEN 0.2 07/13/2018 1651   UROBILINOGEN 1.0 07/09/2017 1324   NITRITE NEGATIVE 09/14/2023 1600   LEUKOCYTESUR NEGATIVE 09/14/2023 1600   Sepsis Labs: @LABRCNTIP (procalcitonin:4,lacticidven:4) ) Recent Results (from the past 240 hours)  Resp panel by RT-PCR (RSV, Flu A&B, Covid) Anterior Nasal Swab     Status: None   Collection Time: 11/20/23  7:46 PM   Specimen: Anterior Nasal Swab  Result Value Ref Range Status   SARS Coronavirus 2 by RT PCR NEGATIVE NEGATIVE Final    Comment: (NOTE) SARS-CoV-2 target nucleic acids are NOT DETECTED.  The SARS-CoV-2 RNA is generally detectable in upper respiratory specimens during the acute phase of infection. The lowest concentration of SARS-CoV-2 viral copies this assay can detect is 138 copies/mL. A negative result does not preclude SARS-Cov-2 infection and should not be used as the sole basis for treatment or other patient management decisions. A negative result may occur with  improper specimen collection/handling, submission of specimen other than nasopharyngeal swab, presence of viral mutation(s) within the areas targeted by this assay, and inadequate number of viral copies(<138 copies/mL). A negative result must be combined with clinical observations, patient history, and epidemiological information. The expected result is Negative.  Fact Sheet for Patients:  bloggercourse.com  Fact Sheet for Healthcare Providers:  seriousbroker.it  This test is no t yet approved or cleared by the [place]  FDA and  has been authorized for detection and/or diagnosis of  SARS-CoV-2 by FDA under an Emergency Use Authorization (EUA). This EUA will remain  in effect (meaning this test can be used) for the duration of the COVID-19 declaration under Section 564(b)(1) of the Act, 21 U.S.C.section 360bbb-3(b)(1), unless the authorization is terminated  or revoked sooner.       Influenza A by PCR NEGATIVE NEGATIVE Final   Influenza B by PCR NEGATIVE NEGATIVE Final    Comment: (NOTE) The Xpert Xpress SARS-CoV-2/FLU/RSV plus assay is intended as an aid in the diagnosis of influenza from Nasopharyngeal swab specimens and should not be used as a sole basis for treatment. Nasal washings and aspirates are unacceptable for Xpert Xpress SARS-CoV-2/FLU/RSV testing.  Fact Sheet for Patients: bloggercourse.com  Fact Sheet for Healthcare Providers: seriousbroker.it  This test is not yet approved or cleared by the [place]  FDA and has been authorized for detection and/or diagnosis of SARS-CoV-2 by FDA under an Emergency Use Authorization (EUA). This EUA will remain in effect (meaning this test can be used) for the duration of the COVID-19 declaration under Section 564(b)(1) of the Act, 21 U.S.C. section 360bbb-3(b)(1), unless the authorization is terminated or revoked.     Resp Syncytial Virus by PCR NEGATIVE NEGATIVE Final    Comment: (NOTE) Fact Sheet for Patients: bloggercourse.com  Fact Sheet for Healthcare Providers: seriousbroker.it  This test is not yet approved or cleared by the [place]  FDA and has been authorized for detection and/or diagnosis of SARS-CoV-2 by FDA under an Emergency Use Authorization (EUA). This EUA will remain in effect (meaning this test can be used) for the duration of the COVID-19 declaration under Section 564(b)(1) of the Act, 21 U.S.C. section 360bbb-3(b)(1), unless the authorization is terminated  or revoked.  Performed at [facility], [address] [place], [place] [address]      Radiological Exams on Admission:   Assessment/Plan Principal Problem:   Thyrotoxicosis Active Problems:   Near syncope   Myocardial injury   Gastroparesis due to DM [facility])   Diabetes mellitus without  complication (HCC)   Leukocytosis   Anxiety and depression   Obesity (BMI 30-39.9)   Assessment and Plan:  Thyrotoxicosis: TSH < 0.100, Free T4=4.61.  Heart rate up to 149 by EKG.  Likely due to medication noncompliance.  Patient states that she is still taking propranolol  and methimazole , but admitted having missed some doses.  ED physician consulted ICU NP, Ouma  -will admit to stepdown as inpatient -will Solu-Cortef  100 mg twice daily - IV fluid: 1 L LR and 1 L normal saline, then 100 cc/h of normal saline - Propranolol  80 mg 3 times daily (patient received 40 mg in ED, will give another 40 mg tonight) - Methimazole  40 mg twice daily (patient received 10 mg in the ED, will give another 30 mg tonight)  Near syncope: Unclear etiology.  No focal neurodeficit on physical examination.  Patient has history of conversion disorder and psychogenic seizure.  Currently no seizure activity.  May be due to dehydration and severe tachycardia. - Frequent neurocheck - Fall precaution - Follow-up CT of head - IV fluid as above  Myocardial injury: Troponin 85 --> 70, likely demand ischemia. -Aspirin  81 mg daily - Check UDS - Trend troponin  Gastroparesis due to DM [facility]): Patient has nausea and a diffuse mild abdominal pain.  No diarrhea or vomiting. - As needed Zofran   Diabetes mellitus without complication (HCC): -SSI - Currently insulin  80 units twice daily     Leukocytosis   Anxiety and depression   Obesity (BMI 30-39.9)      DVT ppx:   SQ Lovenox   Code Status: Full code   Family Communication:     not done, no family member is at bed side.    Disposition Plan:   Anticipate discharge back to previous environment  Consults called: EDP consulted ICU NP, Ouma  Admission status and Level of care: Stepdown: as inpt        Dispo: The patient is from: Home              Anticipated d/c is to: Home              Anticipated d/c date is: 2 days              Patient currently is not medically stable to d/c.    Severity of Illness:  The appropriate patient status for this patient is INPATIENT. Inpatient status is judged to be reasonable and necessary in order to provide the required intensity of service to ensure the patient's safety. The patient's presenting symptoms, physical exam findings, and initial radiographic and laboratory data in the context of their chronic comorbidities is felt to place them at high risk for further clinical deterioration. Furthermore, it is not anticipated that the patient will be medically stable for discharge from the hospital within 2 midnights of admission.   * I certify that at the point of admission it is my clinical judgment that the patient will require inpatient hospital care spanning beyond 2 midnights from the point of admission due to high intensity of service, high risk for further deterioration and high frequency of surveillance required.*       Date of Service 11/20/2023    Caleb Exon [facility]   If 7PM-7AM, please contact night-coverage [email or web address] 11/20/2023, 11:59 PM

## 2023-11-20 NOTE — ED Notes (Signed)
 Seizure pads applied to bed, warm blankets provided.

## 2023-11-20 NOTE — ED Provider Notes (Addendum)
 [facility] Provider Note    Event Date/Time   First MD Initiated Contact with Patient 11/20/23 2026     (approximate)   History   Nasal Congestion and Seizures   HPI  Nancy [name] is a 23 y.o. female who presents to the ED for evaluation of Nasal Congestion and Seizures   I reviewed medical DC summary from 2 months ago.  Obese patient with history of Graves' disease and thyrotoxicosis, PNES, DM, polysubstance abuse  Patient presents to the ED for evaluation of I keep passing out.  She reports multiple episodes of syncope in the past couple days.  Reports compliance with her medications.  Reports chronic abdominal pain and a couple days of upper respiratory congestion   Physical Exam   Triage Vital Signs: ED Triage Vitals  Encounter Vitals Group     BP 11/20/23 1936 (!) 108/56     Girls Systolic BP Percentile --      Girls Diastolic BP Percentile --      Boys Systolic BP Percentile --      Boys Diastolic BP Percentile --      Pulse Rate 11/20/23 1936 (!) 147     Resp 11/20/23 1936 18     Temp 11/20/23 1936 98.5 F (36.9 C)     Temp Source 11/20/23 1936 Oral     SpO2 11/20/23 1927 100 %     Weight 11/20/23 1937 171 lb 15.3 oz (78 kg)     Height 11/20/23 1937 5' 3 (1.6 m)     Head Circumference --      Peak Flow --      Pain Score 11/20/23 1937 9     Pain Loc --      Pain Education --      Exclude from Growth Chart --     Most recent vital signs: Vitals:   11/20/23 2200 11/20/23 2310  BP: 123/69 (!) 101/56  Pulse: (!) 131 (!) 107  Resp: (!) 25 20  Temp:    SpO2: 100% 100%    General: Awake, no distress.  CV:  Good peripheral perfusion.  Tachycardic Resp:  Normal effort.  Abd:  No distention.  Mild and diffuse tenderness MSK:  No deformity noted.  Neuro:  No focal deficits appreciated. Other:     ED Results / Procedures / Treatments   Labs (all labs ordered are listed, but only abnormal results are displayed) Labs  Reviewed  BASIC METABOLIC PANEL WITH GFR - Abnormal; Notable for the following components:      Result Value   Glucose, Bld 227 (*)    All other components within normal limits  CBC - Abnormal; Notable for the following components:   WBC 12.4 (*)    RBC 5.36 (*)    MCV 74.6 (*)    MCH 25.0 (*)    All other components within normal limits  TSH - Abnormal; Notable for the following components:   TSH <0.100 (*)    All other components within normal limits  T4, FREE - Abnormal; Notable for the following components:   Free T4 4.61 (*)    All other components within normal limits  TROPONIN T, HIGH SENSITIVITY - Abnormal; Notable for the following components:   Troponin T High Sensitivity 85 (*)    All other components within normal limits  TROPONIN T, HIGH SENSITIVITY - Abnormal; Notable for the following components:   Troponin T High Sensitivity 70 (*)    All  other components within normal limits  RESP PANEL BY RT-PCR (RSV, FLU A&B, COVID)  [id number]  MAGNESIUM   POC URINE PREG, ED    EKG Sinus tachycardia with a rate of 149 bpm.  No STEMI  RADIOLOGY CXR interpreted by me without evidence of acute cardiopulmonary pathology.  Official radiology report(s): DG Chest 2 View Result Date: 11/20/2023 CLINICAL DATA:  Possible seizure-like activity. EXAM: CHEST - 2 VIEW COMPARISON:  May 23, 2023 FINDINGS: The heart size and mediastinal contours are within normal limits. Both lungs are clear. The visualized skeletal structures are unremarkable. IMPRESSION: No active cardiopulmonary disease. Electronically Signed   By: Suzen Dials M.D.   On: 11/20/2023 20:10    PROCEDURES and INTERVENTIONS:  .Critical Care  Performed by: Claudene Rover, MD Authorized by: Claudene Rover, MD   Critical care provider statement:    Critical care time (minutes):  30   Critical care time was exclusive of:  Separately billable procedures and treating other patients   Critical care was necessary to treat or  prevent imminent or life-threatening deterioration of the following conditions:  Cardiac failure   Critical care was time spent personally by me on the following activities:  Development of treatment plan with patient or surrogate, discussions with consultants, evaluation of patient's response to treatment, examination of patient, ordering and review of laboratory studies, ordering and review of radiographic studies, ordering and performing treatments and interventions, pulse oximetry, re-evaluation of patient's condition and review of old charts .1-3 Lead EKG Interpretation  Performed by: Claudene Rover, MD Authorized by: Claudene Rover, MD     Interpretation: abnormal     ECG rate:  145   ECG rate assessment: tachycardic     Rhythm: sinus tachycardia     Ectopy: none     Conduction: normal     Medications  propranolol  (INDERAL ) tablet 80 mg (has no administration in time range)  methimazole  (TAPAZOLE ) tablet 30 mg (has no administration in time range)  methimazole  (TAPAZOLE ) tablet 40 mg (has no administration in time range)  hydrocortisone  sodium succinate  (SOLU-CORTEF ) 100 MG injection 100 mg (has no administration in time range)  sodium chloride  0.9 % bolus 1,000 mL (has no administration in time range)  0.9 %  sodium chloride  infusion (has no administration in time range)  lactated ringers  bolus 1,000 mL (1,000 mLs Intravenous New Bag/Given 11/20/23 2142)  droperidol  (INAPSINE ) 2.5 MG/ML injection 2.5 mg (2.5 mg Intravenous Given 11/20/23 2149)  propranolol  (INDERAL ) tablet 40 mg (40 mg Oral Given 11/20/23 2140)  methimazole  (TAPAZOLE ) tablet 10 mg (10 mg Oral Given 11/20/23 2149)  ketorolac  (TORADOL ) 30 MG/ML injection 15 mg (15 mg Intravenous Given 11/20/23 2142)  dicyclomine  (BENTYL ) capsule 10 mg (10 mg Oral Given 11/20/23 2140)     IMPRESSION / MDM / ASSESSMENT AND PLAN / ED COURSE  I reviewed the triage vital signs and the nursing notes.  Differential diagnosis includes, but  is not limited to, cardiac dysrhythmia, psychogenic seizures, epileptic seizures,  {Patient presents with symptoms of an acute illness or injury that is potentially life-threatening.  Patient history of Graves' disease and PNES presents with recurrent syncope.  Quite tachycardic, sinus tach in the 140s without ventricular dysrhythmia.  Signs of thyrotoxicosis with undetectable TSH, free T4 at 4.6.  Troponins mildly elevated and likely rate related.  Negative viral swabs, normal electrolytes, mild hyperglycemia without acidosis.  Due to her associated syncope and risk for dysrhythmias in the setting of poorly controlled thyroid  disease, consult medicine for admission and  observation.  Clinical Course as of 11/20/23 2329  [place] Nov 20, 2023  2234 Reassessed, comfortably asleep [DS]  2328 I consult with medicine who agrees to admit hesitantly asking I consult with ICU as he will admit to stepdown unit.  I consult ICU NP [DS]    Clinical Course User Index [DS] Claudene Rover, MD     FINAL CLINICAL IMPRESSION(S) / ED DIAGNOSES   Final diagnoses:  Other thyrotoxicosis with thyrotoxic crisis or storm  Syncope and collapse  Seizure-like activity (HCC)     Rx / DC Orders   ED Discharge Orders     None        Note:  This document was prepared using Dragon voice recognition software and may include unintentional dictation errors.   Claudene Rover, MD 11/20/23 BLANE    Claudene Rover, MD 11/20/23 [phone]

## 2023-11-20 NOTE — ED Triage Notes (Signed)
 First Nurse report from EMS:  Patient brought in via [facility] from home with complaints of nasal congestion. Family reported possible seizure like activity, patient does have hx of seizures. Not post ictal or lethargic on arrival to ER. They reported HR in the 140s, EKG not completed PTA. BGL 282 PTA they are unsure whether patient is diabetic.

## 2023-11-20 NOTE — H&P (Addendum)
 History and Physical    Nancy [name] FMW:[id number] DOB: Feb 05, 2000 DOA: 11/20/2023  Referring MD/NP/PA:   PCP: Shelley Lax, FNP   Patient coming from:  The patient is coming from home.     Chief Complaint: Near syncope  HPI: Nancy [name] is a 23 y.o. female with medical history significant of  thyrotoxicity(graves disease) ,  psychogenic non-epileptic seizures,  insulin  dependent T2DM, gastroparesis, polysubstance abuse, chronic abdominal pain, conversion disorder, who presents with near syncope.   Pt states that she has had several episodes of near syncope in the past several days,  feeling that she is going to pass out, but she is not very sure if she truly passed out or not. No unilateral numbness or tingling in extremities.  No facial droop or slurred speech.  She has front chest discomfort and palpitation.  Her heart rate is up to 140s in ED.  No SOB.  She has nasal congestion and a mild dry cough.  No fever or chills.  She has nausea and chronic diffuse abdominal pain, no vomiting or diarrhea.  No symptoms of UTI.  She states that she is taking her thyroid  medication, but admited missing some doses.  Data reviewed independently and ED Course: pt was found to have TSH < 0.100, Free T4=4.61, WBC 12.4, GFR> 60, negative PCR for COVID, flu and RSV, troponin 85- > 70, chest x-ray negative.  Patient is admitted to stepdown as inpatient.  EKG: I have personally reviewed.  Sinus rhythm, QTc 456, heart rate of 149, LAE.   Review of Systems:   General: no fevers, chills, no body weight gain, has fatigue HEENT: no blurry vision, hearing changes or sore throat Respiratory: no dyspnea, has coughing, no wheezing CV: has chest discomfort and palpitations GI: Has nausea and abdominal pain, no vomiting or diarrhea. GU: no dysuria, burning on urination, increased urinary frequency, hematuria  Ext: no leg edema Neuro: no unilateral weakness, numbness, or tingling, no vision change or  hearing loss Skin: no rash, no skin tear. MSK: No muscle spasm, no deformity, no limitation of range of movement in spin Heme: No easy bruising.  Travel history: No recent long distant travel.   Allergy:  Allergies  Allergen Reactions   Acetaminophen  Other (See Comments)    Avoids due to liver   Ibuprofen  Other (See Comments)    GI MD said to not take this anymore    Past Medical History:  Diagnosis Date   Acanthosis nigricans    Anxiety    CHF (congestive heart failure) (HCC)    Chronic lower back pain    Depression    DKA, type 1 (HCC) 09/13/2018   Dyspepsia    Obesity    Ovarian cyst    pt is not aware of this hx (11/24/2017)   Precocious adrenarche    Premature baby    Seizures (HCC)    Type II diabetes mellitus (HCC)    insulin  dependant    Past Surgical History:  Procedure Laterality Date   ABDOMINAL HERNIA REPAIR     I was a baby   BIOPSY  10/12/2018   Procedure: BIOPSY;  Surgeon: Charlanne Groom, MD;  Location: [facility] ENDOSCOPY;  Service: Endoscopy;;   BIOPSY  02/28/2020   Procedure: BIOPSY;  Surgeon: San Sandor GAILS, DO;  Location: [facility] ENDOSCOPY;  Service: Gastroenterology;;   ESOPHAGOGASTRODUODENOSCOPY (EGD) WITH PROPOFOL  N/A 10/12/2018   Procedure: ESOPHAGOGASTRODUODENOSCOPY (EGD) WITH PROPOFOL ;  Surgeon: Charlanne Groom, MD;  Location: [facility] ENDOSCOPY;  Service: Endoscopy;  Laterality: N/A;   ESOPHAGOGASTRODUODENOSCOPY (EGD) WITH PROPOFOL  N/A 02/28/2020   Procedure: ESOPHAGOGASTRODUODENOSCOPY (EGD) WITH PROPOFOL ;  Surgeon: San Sandor GAILS, DO;  Location: [facility] ENDOSCOPY;  Service: Gastroenterology;  Laterality: N/A;   FLEXIBLE SIGMOIDOSCOPY N/A 02/28/2020   Procedure: FLEXIBLE SIGMOIDOSCOPY;  Surgeon: San Sandor GAILS, DO;  Location: [facility] ENDOSCOPY;  Service: Gastroenterology;  Laterality: N/A;   HERNIA REPAIR     LEFT HEART CATH AND CORONARY ANGIOGRAPHY N/A 10/13/2018   Procedure: LEFT HEART CATH AND CORONARY ANGIOGRAPHY;  Surgeon: Verlin Lonni BIRCH, MD;   Location: [facility] INVASIVE CV LAB;  Service: Cardiovascular;  Laterality: N/A;   TONSILLECTOMY AND ADENOIDECTOMY     WISDOM TOOTH EXTRACTION  2017    Social History:  reports that she has been smoking e-cigarettes. She has never used smokeless tobacco. She reports that she does not drink alcohol and does not use drugs.  Family History:  Family History  Problem Relation Age of Onset   Diabetes Mother    Hypertension Mother    Obesity Mother    Asthma Mother    Allergic rhinitis Mother    Eczema Mother    Cervical cancer Mother    Diabetes Father    Hypertension Father    Obesity Father    Hyperlipidemia Father    Hypertension Paternal Aunt    Hypertension Maternal Grandfather    Colon cancer Maternal Grandfather    Diabetes Paternal Grandmother    Obesity Paternal Grandmother    Diabetes Paternal Grandfather    Obesity Paternal Grandfather    Angioedema Neg Hx    Immunodeficiency Neg Hx    Urticaria Neg Hx    Stomach cancer Neg Hx    Esophageal cancer Neg Hx      Prior to Admission medications   Medication Sig Start Date End Date Taking? Authorizing Provider  Glucagon  (BAQSIMI  TWO PACK) 3 MG/DOSE POWD Place 1 spray into the nose as needed. Patient not taking: Reported on 09/14/2023 08/23/21   Kandis Devaughn Sayres, MD  Insulin  Aspart FlexPen (NOVOLOG ) 100 UNIT/ML Inject 15 Units into the skin as directed. Sliding scale insulin  as follows: BG 150-200  = 2 units BG  200-250 = 4 units BG  250-300 = 6 units BG  300-350 = 8 units BG  350-400 = 10 units Patient not taking: Reported on 09/14/2023 01/09/23   [provider]  insulin  glargine (LANTUS ) 100 unit/mL SOPN Inject 8 Units into the skin 2 (two) times daily. Patient not taking: Reported on 09/14/2023 05/25/23   Alexander, Natalie, DO  Lactulose  20 GM/30ML SOLN Take 30 mLs (20 g total) by mouth 2 (two) times daily as needed. Patient not taking: Reported on 09/14/2023 07/02/23   Laurita Pillion, MD  methimazole  (TAPAZOLE ) 10 MG tablet  Take 2 tablets (20 mg total) by mouth 2 (two) times daily. Patient not taking: Reported on 09/14/2023 10/05/21   Awanda City, MD  metoCLOPramide  (REGLAN ) 10 MG tablet Take 1 tablet (10 mg total) by mouth 3 (three) times daily before meals. 06/06/23 07/06/23  Alexander, Natalie, DO  polyethylene glycol powder (GLYCOLAX /MIRALAX ) 17 GM/SCOOP powder Take 17 g by mouth 2 (two) times daily as needed. Patient not taking: Reported on 09/14/2023 07/02/23   Laurita Pillion, MD  propranolol  (INDERAL ) 80 MG tablet Take 1 tablet (80 mg total) by mouth 3 (three) times daily. Patient not taking: Reported on 09/14/2023 05/25/23   Alexander, Natalie, DO  QUEtiapine  (SEROQUEL ) 100 MG tablet Take 1 tablet (100 mg total) by mouth at bedtime as needed (  insomnia). Patient not taking: Reported on 09/14/2023 06/06/23   Alexander, Natalie, DO  senna-docusate (SENOKOT-S) 8.6-50 MG tablet Take 2 tablets by mouth 2 (two) times daily. Patient not taking: Reported on 09/14/2023 07/02/23   Laurita Pillion, MD  prochlorperazine  (COMPAZINE ) 25 MG suppository PLACE 1 SUPPOSITORY (25 MG TOTAL) RECTALLY EVERY TWELVE HOURS AS NEEDED FOR NAUSEA OR VOMITING. 02/29/20 02/29/20  Raenelle Donalda HERO, MD    Physical Exam: Vitals:   11/20/23 2310 11/21/23 0015 11/21/23 0021 11/21/23 0030  BP: (!) 101/56 113/70  109/61  Pulse: (!) 107 98  94  Resp: 20 16  (!) 22  Temp:   98.6 F (37 C)   TempSrc:   Oral   SpO2: 100% 100%  100%  Weight:      Height:       General: Not in acute distress HEENT:       Eyes: PERRL, EOMI, no jaundice       ENT: No discharge from the ears and nose, no pharynx injection, no tonsillar enlargement.        Neck: No JVD, no bruit, no mass felt. Heme: No neck lymph node enlargement. Cardiac: S1/S2, RRR, tachycardia, no murmurs, No gallops or rubs. Respiratory: No rales, wheezing, rhonchi or rubs. GI: Soft, nondistended, has mild diffused abdominal tenderness, no rebound pain, no organomegaly, BS present. GU: No hematuria Ext: No  pitting leg edema bilaterally. 1+DP/PT pulse bilaterally. Musculoskeletal: No joint deformities, No joint redness or warmth, no limitation of ROM in spin. Skin: No rashes.  Neuro: Lethargic, easily arousable, oriented X3, cranial nerves II-XII grossly intact, moves all extremities normally. Psych: Patient is not psychotic, no suicidal or hemocidal ideation.  Labs on Admission: I have personally reviewed following labs and imaging studies  CBC: Recent Labs  Lab 11/20/23 1939  WBC 12.4*  HGB 13.4  HCT 40.0  MCV 74.6*  PLT 251   Basic Metabolic Panel: Recent Labs  Lab 11/20/23 1939  NA 136  K 3.5  CL 100  CO2 25  GLUCOSE 227*  BUN 10  CREATININE 0.58  CALCIUM  9.4   GFR: Estimated Creatinine Clearance: 106.2 mL/min (by C-G formula based on SCr of 0.58 mg/dL). Liver Function Tests: No results for input(s): AST, ALT, ALKPHOS, BILITOT, PROT, ALBUMIN in the last 168 hours. No results for input(s): LIPASE, AMYLASE in the last 168 hours. No results for input(s): AMMONIA in the last 168 hours. Coagulation Profile: No results for input(s): INR, PROTIME in the last 168 hours. Cardiac Enzymes: No results for input(s): CKTOTAL, CKMB, CKMBINDEX, TROPONINI in the last 168 hours. BNP (last 3 results) No results for input(s): PROBNP in the last 8760 hours. HbA1C: No results for input(s): HGBA1C in the last 72 hours. CBG: No results for input(s): GLUCAP in the last 168 hours. Lipid Profile: No results for input(s): CHOL, HDL, LDLCALC, TRIG, CHOLHDL, LDLDIRECT in the last 72 hours. Thyroid  Function Tests: Recent Labs    11/20/23 1939  TSH <0.100*  FREET4 4.61*   Anemia Panel: No results for input(s): VITAMINB12, FOLATE, FERRITIN, TIBC, IRON, RETICCTPCT in the last 72 hours. Urine analysis:    Component Value Date/Time   COLORURINE YELLOW (A) 09/14/2023 1600   APPEARANCEUR HAZY (A) 09/14/2023 1600   LABSPEC 1.007  09/14/2023 1600   PHURINE 6.0 09/14/2023 1600   GLUCOSEU >=500 (A) 09/14/2023 1600   HGBUR NEGATIVE 09/14/2023 1600   BILIRUBINUR NEGATIVE 09/14/2023 1600   BILIRUBINUR negative 07/13/2018 1651   KETONESUR 5 (A) 09/14/2023 1600  PROTEINUR 30 (A) 09/14/2023 1600   UROBILINOGEN 0.2 07/13/2018 1651   UROBILINOGEN 1.0 07/09/2017 1324   NITRITE NEGATIVE 09/14/2023 1600   LEUKOCYTESUR NEGATIVE 09/14/2023 1600   Sepsis Labs: @LABRCNTIP (procalcitonin:4,lacticidven:4) ) Recent Results (from the past 240 hours)  Resp panel by RT-PCR (RSV, Flu A&B, Covid) Anterior Nasal Swab     Status: None   Collection Time: 11/20/23  7:46 PM   Specimen: Anterior Nasal Swab  Result Value Ref Range Status   SARS Coronavirus 2 by RT PCR NEGATIVE NEGATIVE Final    Comment: (NOTE) SARS-CoV-2 target nucleic acids are NOT DETECTED.  The SARS-CoV-2 RNA is generally detectable in upper respiratory specimens during the acute phase of infection. The lowest concentration of SARS-CoV-2 viral copies this assay can detect is 138 copies/mL. A negative result does not preclude SARS-Cov-2 infection and should not be used as the sole basis for treatment or other patient management decisions. A negative result may occur with  improper specimen collection/handling, submission of specimen other than nasopharyngeal swab, presence of viral mutation(s) within the areas targeted by this assay, and inadequate number of viral copies(<138 copies/mL). A negative result must be combined with clinical observations, patient history, and epidemiological information. The expected result is Negative.  Fact Sheet for Patients:  bloggercourse.com  Fact Sheet for Healthcare Providers:  seriousbroker.it  This test is no t yet approved or cleared by the [place]  FDA and  has been authorized for detection and/or diagnosis of SARS-CoV-2 by FDA under an Emergency Use Authorization  (EUA). This EUA will remain  in effect (meaning this test can be used) for the duration of the COVID-19 declaration under Section 564(b)(1) of the Act, 21 U.S.C.section 360bbb-3(b)(1), unless the authorization is terminated  or revoked sooner.       Influenza A by PCR NEGATIVE NEGATIVE Final   Influenza B by PCR NEGATIVE NEGATIVE Final    Comment: (NOTE) The Xpert Xpress SARS-CoV-2/FLU/RSV plus assay is intended as an aid in the diagnosis of influenza from Nasopharyngeal swab specimens and should not be used as a sole basis for treatment. Nasal washings and aspirates are unacceptable for Xpert Xpress SARS-CoV-2/FLU/RSV testing.  Fact Sheet for Patients: bloggercourse.com  Fact Sheet for Healthcare Providers: seriousbroker.it  This test is not yet approved or cleared by the [place]  FDA and has been authorized for detection and/or diagnosis of SARS-CoV-2 by FDA under an Emergency Use Authorization (EUA). This EUA will remain in effect (meaning this test can be used) for the duration of the COVID-19 declaration under Section 564(b)(1) of the Act, 21 U.S.C. section 360bbb-3(b)(1), unless the authorization is terminated or revoked.     Resp Syncytial Virus by PCR NEGATIVE NEGATIVE Final    Comment: (NOTE) Fact Sheet for Patients: bloggercourse.com  Fact Sheet for Healthcare Providers: seriousbroker.it  This test is not yet approved or cleared by the [place]  FDA and has been authorized for detection and/or diagnosis of SARS-CoV-2 by FDA under an Emergency Use Authorization (EUA). This EUA will remain in effect (meaning this test can be used) for the duration of the COVID-19 declaration under Section 564(b)(1) of the Act, 21 U.S.C. section 360bbb-3(b)(1), unless the authorization is terminated or revoked.  Performed at [facility], [address]  [place], [place] [address]      Radiological Exams on Admission:   Assessment/Plan Principal Problem:   Thyrotoxicosis Active Problems:   Near syncope   Myocardial injury   Gastroparesis due to DM [facility])   Diabetes mellitus without  complication (HCC)   Leukocytosis   Anxiety and depression   Obesity (BMI 30-39.9)   Assessment and Plan:  Thyrotoxicosis: TSH < 0.100, Free T4=4.61.  Heart rate up to 149 by EKG.  Likely due to medication noncompliance.  Patient states that she is taking propranolol  and methimazole , but admitted having missed some doses.  ED physician consulted ICU NP, Ouma  -will admit to stepdown as inpatient --> changed to PCU bed after improvement. -will Solu-Cortef  100 mg twice daily - IV fluid: 1 L LR and 1 L normal saline, then 100 cc/h of normal saline - Propranolol  80 mg 3 times daily (patient received 40 mg in ED, will give another 40 mg tonight) - Methimazole  40 mg twice daily (patient received 10 mg in the ED, will give another 30 mg tonight)    Near syncope: Unclear etiology.  No focal neurodeficit on physical examination.  Patient has history of conversion disorder and psychogenic seizure.  Currently no seizure activity.  May be due to dehydration and severe tachycardia. - Frequent neurocheck - Fall precaution - Follow-up CT of head - IV fluid as above -IVF as above -UDS  Myocardial injury: Troponin 85 --> 70, likely demand ischemia. -Aspirin  81 mg daily - Check UDS - Trend troponin  Gastroparesis due to DM [facility]): Patient has nausea and a diffuse mild abdominal pain.  No diarrhea or vomiting. - As needed Zofran   Diabetes mellitus without complication [facility]): Recent A1c 6.0, well-controlled.  Patient is on NovoLog  and glargine insulin  8 units twice daily at home -SSI - Currently insulin  8 units twice daily  Leukocytosis: WBC 12.4, no fever, no source of infection identified.  Likely reactive. - Follow-up with CBC  Anxiety and depression:  Patient is not taking medications currently. - Observe closely  Obesity (BMI 30-39.9): Patient has Obesity Class 78, with body weight  Kg and BMI 30.46 kg/m2.  - Encourage losing weight - Exercise and healthy diet        DVT ppx:   SQ Lovenox   Code Status: Full code   Family Communication:     not done, no family member is at bed side.    Disposition Plan:  Anticipate discharge back to previous environment  Consults called: EDP consulted ICU NP, Ouma  Admission status and Level of care: Progressive: as inpt        Dispo: The patient is from: Home              Anticipated d/c is to: Home              Anticipated d/c date is: 2 days              Patient currently is not medically stable to d/c.    Severity of Illness:  The appropriate patient status for this patient is INPATIENT. Inpatient status is judged to be reasonable and necessary in order to provide the required intensity of service to ensure the patient's safety. The patient's presenting symptoms, physical exam findings, and initial radiographic and laboratory data in the context of their chronic comorbidities is felt to place them at high risk for further clinical deterioration. Furthermore, it is not anticipated that the patient will be medically stable for discharge from the hospital within 2 midnights of admission.   * I certify that at the point of admission it is my clinical judgment that the patient will require inpatient hospital care spanning beyond 2 midnights from the point of admission due to high intensity of  service, high risk for further deterioration and high frequency of surveillance required.*       Date of Service 11/21/2023    Caleb Exon [facility]   If 7PM-7AM, please contact night-coverage [email or web address] 11/21/2023, 1:38 AM

## 2023-11-20 NOTE — ED Notes (Signed)
 Patient transported to CT

## 2023-11-21 ENCOUNTER — Other Ambulatory Visit: Payer: Self-pay

## 2023-11-21 ENCOUNTER — Inpatient Hospital Stay

## 2023-11-21 DIAGNOSIS — E0590 Thyrotoxicosis, unspecified without thyrotoxic crisis or storm: Secondary | ICD-10-CM | POA: Diagnosis not present

## 2023-11-21 LAB — CBC
HCT: 34 % — ABNORMAL LOW (ref 36.0–46.0)
Hemoglobin: 11.3 g/dL — ABNORMAL LOW (ref 12.0–15.0)
MCH: 24.9 pg — ABNORMAL LOW (ref 26.0–34.0)
MCHC: 33.2 g/dL (ref 30.0–36.0)
MCV: 74.9 fL — ABNORMAL LOW (ref 80.0–100.0)
Platelets: 204 K/uL (ref 150–400)
RBC: 4.54 MIL/uL (ref 3.87–5.11)
RDW: 13.5 % (ref 11.5–15.5)
WBC: 8.4 K/uL (ref 4.0–10.5)
nRBC: 0 % (ref 0.0–0.2)

## 2023-11-21 LAB — BASIC METABOLIC PANEL WITH GFR
Anion gap: 9 (ref 5–15)
BUN: 13 mg/dL (ref 6–20)
CO2: 25 mmol/L (ref 22–32)
Calcium: 8.6 mg/dL — ABNORMAL LOW (ref 8.9–10.3)
Chloride: 105 mmol/L (ref 98–111)
Creatinine, Ser: 0.46 mg/dL (ref 0.44–1.00)
GFR, Estimated: >60 mL/min (ref >60)
Glucose, Bld: 225 mg/dL — ABNORMAL HIGH (ref 70–99)
Potassium: 3.8 mmol/L (ref 3.5–5.1)
Sodium: 140 mmol/L (ref 135–145)

## 2023-11-21 LAB — URINE DRUG SCREEN
Amphetamines: NEGATIVE
Barbiturates: NEGATIVE
Benzodiazepines: NEGATIVE
Cocaine: NEGATIVE
Fentanyl: NEGATIVE
Methadone Scn, Ur: NEGATIVE
Opiates: NEGATIVE
Tetrahydrocannabinol: POSITIVE — AB

## 2023-11-21 LAB — TROPONIN T, HIGH SENSITIVITY
Troponin T High Sensitivity: 31 ng/L — ABNORMAL HIGH (ref 0–19)
Troponin T High Sensitivity: 34 ng/L — ABNORMAL HIGH (ref 0–19)
Troponin T High Sensitivity: 53 ng/L — ABNORMAL HIGH (ref 0–19)

## 2023-11-21 LAB — GLUCOSE, CAPILLARY: Glucose-Capillary: 243 mg/dL — ABNORMAL HIGH (ref 70–99)

## 2023-11-21 LAB — PREGNANCY, URINE: Preg Test, Ur: NEGATIVE

## 2023-11-21 LAB — MAGNESIUM: Magnesium: 2 mg/dL (ref 1.7–2.4)

## 2023-11-21 LAB — CBG MONITORING, ED: Glucose-Capillary: 208 mg/dL — ABNORMAL HIGH (ref 70–99)

## 2023-11-21 MED ORDER — OXYCODONE HCL 5 MG PO TABS
5.0000 mg | ORAL_TABLET | Freq: Four times a day (QID) | ORAL | Status: DC | PRN
  Administered 2023-11-21 – 2023-11-22 (×5): 5 mg via ORAL
  Filled 2023-11-21 (×5): qty 1

## 2023-11-21 MED ORDER — DOXYCYCLINE HYCLATE 100 MG PO TABS
100.0000 mg | ORAL_TABLET | Freq: Two times a day (BID) | ORAL | Status: DC
  Administered 2023-11-21 – 2023-11-23 (×4): 100 mg via ORAL
  Filled 2023-11-21 (×4): qty 1

## 2023-11-21 MED ORDER — MORPHINE SULFATE (PF) 2 MG/ML IV SOLN
1.0000 mg | INTRAVENOUS | Status: DC | PRN
  Administered 2023-11-21 – 2023-11-23 (×5): 1 mg via INTRAVENOUS
  Filled 2023-11-21 (×5): qty 1

## 2023-11-21 MED ORDER — SCOPOLAMINE 1 MG/3DAYS TD PT72
1.0000 | MEDICATED_PATCH | TRANSDERMAL | Status: DC
  Administered 2023-11-21: 1 mg via TRANSDERMAL
  Filled 2023-11-21: qty 1

## 2023-11-21 MED ORDER — INSULIN GLARGINE-YFGN 100 UNIT/ML ~~LOC~~ SOLN: 8.0000 [IU] | Freq: Two times a day (BID) | SUBCUTANEOUS | Status: DC

## 2023-11-21 MED ORDER — INSULIN GLARGINE-YFGN 100 UNIT/ML ~~LOC~~ SOLN
6.0000 [IU] | Freq: Two times a day (BID) | SUBCUTANEOUS | Status: DC
  Administered 2023-11-21 – 2023-11-23 (×6): 6 [IU] via SUBCUTANEOUS
  Filled 2023-11-21 (×7): qty 0.06

## 2023-11-21 MED ORDER — ASPIRIN 81 MG PO TBEC
81.0000 mg | DELAYED_RELEASE_TABLET | Freq: Every day | ORAL | Status: DC
  Administered 2023-11-21 – 2023-11-23 (×3): 81 mg via ORAL
  Filled 2023-11-21 (×3): qty 1

## 2023-11-21 MED ORDER — HYDROCORTISONE SOD SUC (PF) 100 MG IJ SOLR
50.0000 mg | Freq: Two times a day (BID) | INTRAMUSCULAR | Status: DC
  Administered 2023-11-21 – 2023-11-22 (×3): 50 mg via INTRAVENOUS
  Filled 2023-11-21 (×4): qty 1

## 2023-11-21 MED ORDER — AMOXICILLIN-POT CLAVULANATE 875-125 MG PO TABS
1.0000 | ORAL_TABLET | Freq: Two times a day (BID) | ORAL | Status: DC
  Administered 2023-11-21 – 2023-11-23 (×4): 1 via ORAL
  Filled 2023-11-21 (×4): qty 1

## 2023-11-21 NOTE — ED Notes (Signed)
 Patient requesting her oxycodone  IR 5 mg that she takes daily for her neuropathy. Pharmacy notified and MD messaged. Patient in room crying at this time.

## 2023-11-21 NOTE — Progress Notes (Signed)
 PROGRESS NOTE    Nancy [name]  FMW:[id number] DOB: Jul 08, 2000 DOA: 11/20/2023 PCP: Shelley Lax, FNP   Assessment & Plan:   Principal Problem:   Thyrotoxicosis Active Problems:   Near syncope   Myocardial injury   Gastroparesis due to DM (HCC)   Diabetes mellitus without complication (HCC)   Leukocytosis   Anxiety and depression   Obesity (BMI 30-39.9)  Assessment and Plan: Thyrotoxicosis: TSH < 0.100, Free T4=4.61. Likely due to medication noncompliance. Taking propranolol  and methimazole , but admitted having missed some doses. Continue on solu-cortef , propranolol , methimazole    Near syncope: Unclear etiology.  No focal neurodeficit on physical examination. Hx of conversion disorder and psychogenic seizure.   Possible hidradenitis suppurativa: of b/l breasts. See US  of L& R breast. Will start augmentin  and doxy. Will need f/u outpatient w/ dermatology    Myocardial injury: Troponin 85 --> 70, likely demand ischemia.   Gastroparesis: secondary to DM2:   No diarrhea or vomiting. Zofran  prn. Scopolamine  patch    DM2: well controlled, HbA1c 6.0. Continue on glargine, SSI w/ accuchecks   Leukocytosis: resolved    Depression: severity unknown. Not taking any meds    Obesity: BMI 30.4. Would benefit from weight       DVT prophylaxis: lovenox   Code Status: full  Family Communication:  Disposition Plan: likely d/c back home   Level of care: Progressive  Status is: Inpatient Remains inpatient appropriate because: severity of illness    Consultants:    Procedures:  Antimicrobials: augmentin , doxy   Subjective: Pt c/o nerve pain   Objective: Vitals:   11/21/23 0700 11/21/23 0800 11/21/23 0900 11/21/23 0947  BP: 136/82 (!) 147/94 136/86   Pulse: 100 (!) 101 100 99  Resp: (!) 21 (!) 31 (!) 38   Temp:      TempSrc:      SpO2: 100% 100% 100%   Weight:      Height:        Intake/Output Summary (Last 24 hours) at 11/21/2023 0950 Last data filed at  11/21/2023 0108 Gross per 24 hour  Intake 2000 ml  Output --  Net 2000 ml   Filed Weights   11/20/23 1937  Weight: 78 kg    Examination:  General exam: Appears calm and comfortable  Respiratory system: Clear to auscultation. Respiratory effort normal. Cardiovascular system: S1 & S2+. No rubs, gallops or clicks.  Gastrointestinal system: Abdomen is nondistended, soft and nontender. Normal bowel sounds heard. Central nervous system: Alert and oriented. Moves all extremities  Psychiatry: Judgement and insight appear normal. Flat mood and affect    Data Reviewed: I have personally reviewed following labs and imaging studies  CBC: Recent Labs  Lab 11/20/23 1939 11/21/23 0443  WBC 12.4* 8.4  HGB 13.4 11.3*  HCT 40.0 34.0*  MCV 74.6* 74.9*  PLT 251 204   Basic Metabolic Panel: Recent Labs  Lab 11/20/23 1939 11/21/23 0443  NA 136 140  K 3.5 3.8  CL 100 105  CO2 25 25  GLUCOSE 227* 225*  BUN 10 13  CREATININE 0.58 0.46  CALCIUM  9.4 8.6*  MG  --  2.0   GFR: Estimated Creatinine Clearance: 106.2 mL/min (by C-G formula based on SCr of 0.46 mg/dL). Liver Function Tests: No results for input(s): AST, ALT, ALKPHOS, BILITOT, PROT, ALBUMIN in the last 168 hours. No results for input(s): LIPASE, AMYLASE in the last 168 hours. No results for input(s): AMMONIA in the last 168 hours. Coagulation Profile: No results for input(s): INR,  PROTIME in the last 168 hours. Cardiac Enzymes: No results for input(s): CKTOTAL, CKMB, CKMBINDEX, TROPONINI in the last 168 hours. BNP (last 3 results) No results for input(s): PROBNP in the last 8760 hours. HbA1C: No results for input(s): HGBA1C in the last 72 hours. CBG: Recent Labs  Lab 11/21/23 0934  GLUCAP 208*   Lipid Profile: No results for input(s): CHOL, HDL, LDLCALC, TRIG, CHOLHDL, LDLDIRECT in the last 72 hours. Thyroid  Function Tests: Recent Labs    11/20/23 1939  TSH  <0.100*  FREET4 4.61*   Anemia Panel: No results for input(s): VITAMINB12, FOLATE, FERRITIN, TIBC, IRON, RETICCTPCT in the last 72 hours. Sepsis Labs: No results for input(s): PROCALCITON, LATICACIDVEN in the last 168 hours.  Recent Results (from the past 240 hours)  Resp panel by RT-PCR (RSV, Flu A&B, Covid) Anterior Nasal Swab     Status: None   Collection Time: 11/20/23  7:46 PM   Specimen: Anterior Nasal Swab  Result Value Ref Range Status   SARS Coronavirus 2 by RT PCR NEGATIVE NEGATIVE Final    Comment: (NOTE) SARS-CoV-2 target nucleic acids are NOT DETECTED.  The SARS-CoV-2 RNA is generally detectable in upper respiratory specimens during the acute phase of infection. The lowest concentration of SARS-CoV-2 viral copies this assay can detect is 138 copies/mL. A negative result does not preclude SARS-Cov-2 infection and should not be used as the sole basis for treatment or other patient management decisions. A negative result may occur with  improper specimen collection/handling, submission of specimen other than nasopharyngeal swab, presence of viral mutation(s) within the areas targeted by this assay, and inadequate number of viral copies(<138 copies/mL). A negative result must be combined with clinical observations, patient history, and epidemiological information. The expected result is Negative.  Fact Sheet for Patients:  bloggercourse.com  Fact Sheet for Healthcare Providers:  seriousbroker.it  This test is no t yet approved or cleared by the [place]  FDA and  has been authorized for detection and/or diagnosis of SARS-CoV-2 by FDA under an Emergency Use Authorization (EUA). This EUA will remain  in effect (meaning this test can be used) for the duration of the COVID-19 declaration under Section 564(b)(1) of the Act, 21 U.S.C.section 360bbb-3(b)(1), unless the authorization is terminated  or  revoked sooner.       Influenza A by PCR NEGATIVE NEGATIVE Final   Influenza B by PCR NEGATIVE NEGATIVE Final    Comment: (NOTE) The Xpert Xpress SARS-CoV-2/FLU/RSV plus assay is intended as an aid in the diagnosis of influenza from Nasopharyngeal swab specimens and should not be used as a sole basis for treatment. Nasal washings and aspirates are unacceptable for Xpert Xpress SARS-CoV-2/FLU/RSV testing.  Fact Sheet for Patients: bloggercourse.com  Fact Sheet for Healthcare Providers: seriousbroker.it  This test is not yet approved or cleared by the [place]  FDA and has been authorized for detection and/or diagnosis of SARS-CoV-2 by FDA under an Emergency Use Authorization (EUA). This EUA will remain in effect (meaning this test can be used) for the duration of the COVID-19 declaration under Section 564(b)(1) of the Act, 21 U.S.C. section 360bbb-3(b)(1), unless the authorization is terminated or revoked.     Resp Syncytial Virus by PCR NEGATIVE NEGATIVE Final    Comment: (NOTE) Fact Sheet for Patients: bloggercourse.com  Fact Sheet for Healthcare Providers: seriousbroker.it  This test is not yet approved or cleared by the [place]  FDA and has been authorized for detection and/or diagnosis of SARS-CoV-2 by FDA under an Emergency Use Authorization (  EUA). This EUA will remain in effect (meaning this test can be used) for the duration of the COVID-19 declaration under Section 564(b)(1) of the Act, 21 U.S.C. section 360bbb-3(b)(1), unless the authorization is terminated or revoked.  Performed at [facility], [address] [place], [place] [address]          Radiology Studies: CT HEAD WO CONTRAST ( ) Result Date: 11/21/2023 EXAM: CT HEAD WITHOUT CONTRAST 11/21/2023 12:10:41 AM TECHNIQUE: CT of the head was performed without the administration of  intravenous contrast. Automated exposure control, iterative reconstruction, and/or weight based adjustment of the mA/kV was utilized to reduce the radiation dose to as low as reasonably achievable. COMPARISON: CT head 06/05/2023 CLINICAL HISTORY: Syncope/presyncope, cerebrovascular cause suspected FINDINGS: BRAIN AND VENTRICLES: No acute hemorrhage. No evidence of acute infarct. No hydrocephalus. No extra-axial collection. No mass effect or midline shift. ORBITS: No acute abnormality. SINUSES: Bilaterally trace ethmoid, sphenoid, maxillary sinus mucosal thickening. The mastoid air cells are clear. SOFT TISSUES AND SKULL: No acute soft tissue abnormality. No skull fracture. IMPRESSION: 1. No acute intracranial abnormality. Electronically signed by: Morgane Naveau MD 11/21/2023 12:20 AM EST RP Workstation: [id number]   DG Chest 2 View Result Date: 11/20/2023 CLINICAL DATA:  Possible seizure-like activity. EXAM: CHEST - 2 VIEW COMPARISON:  May 23, 2023 FINDINGS: The heart size and mediastinal contours are within normal limits. Both lungs are clear. The visualized skeletal structures are unremarkable. IMPRESSION: No active cardiopulmonary disease. Electronically Signed   By: Suzen Dials M.D.   On: 11/20/2023 20:10        Scheduled Meds:  aspirin  EC  81 mg Oral Daily   hydrocortisone  sod succinate (SOLU-CORTEF ) inj  100 mg Intravenous Q12H   insulin  glargine-yfgn  6 Units Subcutaneous BID   methIMAzole   40 mg Oral BID   propranolol   80 mg Oral TID   Continuous Infusions:  sodium chloride  100 mL/hr at 11/21/23 0108     LOS: 1 day      Anthony CHRISTELLA Pouch, MD [facility] Pager [phone]  If 7PM-7AM, please contact night-coverage [email or web address] 11/21/2023, 9:50 AM

## 2023-11-21 NOTE — ED Notes (Signed)
 This EDT checked cbg for this pt, 208. RN aware.

## 2023-11-21 NOTE — Plan of Care (Signed)

## 2023-11-22 ENCOUNTER — Other Ambulatory Visit: Payer: Self-pay

## 2023-11-22 DIAGNOSIS — E0590 Thyrotoxicosis, unspecified without thyrotoxic crisis or storm: Secondary | ICD-10-CM | POA: Diagnosis not present

## 2023-11-22 LAB — IRON AND TIBC
Iron: 17 ug/dL — ABNORMAL LOW (ref 28–170)
Saturation Ratios: 6 % — ABNORMAL LOW (ref 10.4–31.8)
TIBC: 263 ug/dL (ref 250–450)
UIBC: 246 ug/dL

## 2023-11-22 LAB — TROPONIN T, HIGH SENSITIVITY
Troponin T High Sensitivity: 25 ng/L — ABNORMAL HIGH (ref 0–19)
Troponin T High Sensitivity: 24 ng/L — ABNORMAL HIGH (ref 0–19)
Troponin T High Sensitivity: 28 ng/L — ABNORMAL HIGH (ref 0–19)
Troponin T High Sensitivity: 29 ng/L — ABNORMAL HIGH (ref 0–19)

## 2023-11-22 LAB — GLUCOSE, CAPILLARY
Glucose-Capillary: 179 mg/dL — ABNORMAL HIGH (ref 70–99)
Glucose-Capillary: 259 mg/dL — ABNORMAL HIGH (ref 70–99)
Glucose-Capillary: 124 mg/dL — ABNORMAL HIGH (ref 70–99)
Glucose-Capillary: 151 mg/dL — ABNORMAL HIGH (ref 70–99)

## 2023-11-22 LAB — FERRITIN: Ferritin: 80 ng/mL (ref 11–307)

## 2023-11-22 MED ORDER — FENTANYL CITRATE (PF) 50 MCG/ML IJ SOSY
25.0000 ug | PREFILLED_SYRINGE | INTRAMUSCULAR | Status: DC | PRN
  Administered 2023-11-22 – 2023-11-23 (×3): 25 ug via INTRAVENOUS
  Filled 2023-11-22 (×4): qty 1

## 2023-11-22 MED ORDER — FENTANYL 12 MCG/HR TD PT72
1.0000 | MEDICATED_PATCH | TRANSDERMAL | Status: DC
  Administered 2023-11-22: 1 via TRANSDERMAL
  Filled 2023-11-22: qty 1

## 2023-11-22 MED ORDER — SODIUM CHLORIDE 0.9 % IV BOLUS
500.0000 mL | Freq: Once | INTRAVENOUS | Status: AC
  Administered 2023-11-22: 500 mL via INTRAVENOUS

## 2023-11-22 MED ORDER — NITROGLYCERIN 0.4 MG SL SUBL
0.4000 mg | SUBLINGUAL_TABLET | SUBLINGUAL | Status: DC | PRN
  Administered 2023-11-22 (×2): 0.4 mg via SUBLINGUAL
  Filled 2023-11-22: qty 1

## 2023-11-22 MED ORDER — FERROUS SULFATE 325 (65 FE) MG PO TABS
325.0000 mg | ORAL_TABLET | Freq: Two times a day (BID) | ORAL | Status: DC
  Administered 2023-11-22 – 2023-11-23 (×2): 325 mg via ORAL
  Filled 2023-11-22 (×2): qty 1

## 2023-11-22 MED ORDER — KETOROLAC TROMETHAMINE 15 MG/ML IJ SOLN
15.0000 mg | Freq: Four times a day (QID) | INTRAMUSCULAR | Status: DC | PRN
  Filled 2023-11-22 (×2): qty 1

## 2023-11-22 MED ORDER — LORAZEPAM 2 MG/ML IJ SOLN: 1.0000 mg | INTRAMUSCULAR | Status: DC | PRN

## 2023-11-22 MED ORDER — QUETIAPINE FUMARATE 25 MG PO TABS
100.0000 mg | ORAL_TABLET | Freq: Every day | ORAL | Status: DC
  Administered 2023-11-22: 100 mg via ORAL
  Filled 2023-11-22: qty 4

## 2023-11-22 MED ORDER — INSULIN ASPART 100 UNIT/ML IJ SOLN
0.0000 [IU] | Freq: Three times a day (TID) | INTRAMUSCULAR | Status: DC
  Administered 2023-11-22: 5 [IU] via SUBCUTANEOUS
  Administered 2023-11-22: 1 [IU] via SUBCUTANEOUS
  Administered 2023-11-23: 2 [IU] via SUBCUTANEOUS
  Administered 2023-11-23: 3 [IU] via SUBCUTANEOUS
  Filled 2023-11-22: qty 5
  Filled 2023-11-22: qty 3
  Filled 2023-11-22: qty 1
  Filled 2023-11-22: qty 2

## 2023-11-22 MED ORDER — ALUM & MAG HYDROXIDE-SIMETH 200-200-20 MG/5ML PO SUSP: 30.0000 mL | ORAL | Status: DC | PRN

## 2023-11-22 NOTE — Progress Notes (Signed)
Transition of Care Department (TOC) has reviewed patient and no TOC needs have been identified at this time. We will continue to monitor patient advancement through interdisciplinary progression rounds. If new patient transition needs arise, please place a TOC consult. 

## 2023-11-22 NOTE — Plan of Care (Signed)

## 2023-11-22 NOTE — Plan of Care (Signed)

## 2023-11-22 NOTE — Progress Notes (Signed)
   11/22/23 0800  Spiritual Encounters  Type of Visit Initial  Care provided to: Patient  Referral source Nurse (RN/NT/LPN)  Reason for visit Advance directives  OnCall Visit Yes   Chaplain responded to consult for AD. Chaplain explained AD paperwork to patient and provided compassionate presence. Chaplain is available for follow up as needed.

## 2023-11-22 NOTE — Progress Notes (Signed)
 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 Likely due to medication noncompliance. Taking propranolol  and methimazole , but admitted having missed some doses. Continue on steroids, methimazole , propranolol    Near syncope: Unclear etiology.  No focal neurodeficit on physical examination. Hx of conversion disorder and psychogenic seizure.   Possible hidradenitis suppurativa: of b/l breasts. See US  of L& R breast. Continue on augmentin , doxy. Will need f/u outpatient w/ dermatology    Myocardial injury: Troponin 85 --> 70, likely demand ischemia.   Gastroparesis: secondary to DM2:   No diarrhea or vomiting. Zofran  prn. Scopolamine  patch    DM2: well controlled, HbA1c 6.0. Continue on glargine, SSI w/ accuchecks    Leukocytosis: resolved    Depression: severity unknown. Not taking any meds    Obesity: BMI 30.4. Would benefit from weight loss       DVT prophylaxis: lovenox   Code Status: full  Family Communication:  Disposition Plan: likely d/c back home   Level of care: Med-Surg  Status is: Inpatient Remains inpatient appropriate because: severity of illness    Consultants:    Procedures:  Antimicrobials: augmentin , doxy   Subjective: Pt c/o intermittent chest pain   Objective: Vitals:   11/21/23 2354 11/22/23 0328 11/22/23 0332 11/22/23 0430  BP: (!) 124/55 (!) 119/44 96/61 125/63  Pulse: (!) 103 (!) 102 98 95  Resp: 18   18  Temp: 98 F (36.7 C)   98.6 F (37 C)  TempSrc: Oral   Oral  SpO2: 100%   100%  Weight:      Height:        Intake/Output Summary (Last 24  hours) at 11/22/2023 0828 Last data filed at 11/21/2023 1916 Gross per 24 hour  Intake 535 ml  Output --  Net 535 ml   Filed Weights   11/20/23 1937  Weight: 78 kg    Examination:  General exam: Appears comfortable Respiratory system: clear breath sounds b/l  Cardiovascular system: S1/S2+. No rubs or clicks   Gastrointestinal system: Abd is soft, NT, obese & hypoactive bowel sounds Central nervous system: Alert & oriented. Moves all extremities  Psychiatry: Judgement and insight appears at baseline. Flat mood and affect     Data Reviewed: I have personally reviewed following labs and imaging studies  CBC: Recent Labs  Lab 11/20/23 1939 11/21/23 0443  WBC 12.4* 8.4  HGB 13.4 11.3*  HCT 40.0 34.0*  MCV 74.6* 74.9*  PLT 251 204   Basic Metabolic Panel: Recent Labs  Lab 11/20/23 1939 11/21/23 0443  NA 136 140  K 3.5 3.8  CL 100 105  CO2 25 25  GLUCOSE 227* 225*  BUN 10 13  CREATININE 0.58 0.46  CALCIUM  9.4 8.6*  MG  --  2.0   GFR: Estimated Creatinine Clearance: 106.2 mL/min (by C-G formula based on SCr of 0.46 mg/dL). Liver Function Tests: No results for input(s): AST, ALT, ALKPHOS, BILITOT, PROT, ALBUMIN in the last 168 hours. No results for input(s): LIPASE, AMYLASE in the last 168 hours. No results for input(s): AMMONIA in the last 168 hours. Coagulation Profile: No results for  input(s): INR, PROTIME in the last 168 hours. Cardiac Enzymes: No results for input(s): CKTOTAL, CKMB, CKMBINDEX, TROPONINI in the last 168 hours. BNP (last 3 results) No results for input(s): PROBNP in the last 8760 hours. HbA1C: No results for input(s): HGBA1C in the last 72 hours. CBG: Recent Labs  Lab 11/21/23 0934 11/21/23 1726  GLUCAP 208* 243*   Lipid Profile: No results for input(s): CHOL, HDL, LDLCALC, TRIG, CHOLHDL, LDLDIRECT in the last 72 hours. Thyroid  Function Tests: Recent Labs    11/20/23 1939  TSH  <0.100*  FREET4 4.61*   Anemia Panel: Recent Labs    11/22/23 0402  FERRITIN 80  TIBC 263  IRON 17*   Sepsis Labs: No results for input(s): PROCALCITON, LATICACIDVEN in the last 168 hours.  Recent Results (from the past 240 hours)  Resp panel by RT-PCR (RSV, Flu A&B, Covid) Anterior Nasal Swab     Status: None   Collection Time: 11/20/23  7:46 PM   Specimen: Anterior Nasal Swab  Result Value Ref Range Status   SARS Coronavirus 2 by RT PCR NEGATIVE NEGATIVE Final    Comment: (NOTE) SARS-CoV-2 target nucleic acids are NOT DETECTED.  The SARS-CoV-2 RNA is generally detectable in upper respiratory specimens during the acute phase of infection. The lowest concentration of SARS-CoV-2 viral copies this assay can detect is 138 copies/mL. A negative result does not preclude SARS-Cov-2 infection and should not be used as the sole basis for treatment or other patient management decisions. A negative result may occur with  improper specimen collection/handling, submission of specimen other than nasopharyngeal swab, presence of viral mutation(s) within the areas targeted by this assay, and inadequate number of viral copies(<138 copies/mL). A negative result must be combined with clinical observations, patient history, and epidemiological information. The expected result is Negative.  Fact Sheet for Patients:  bloggercourse.com  Fact Sheet for Healthcare Providers:  seriousbroker.it  This test is no t yet approved or cleared by the [place]  FDA and  has been authorized for detection and/or diagnosis of SARS-CoV-2 by FDA under an Emergency Use Authorization (EUA). This EUA will remain  in effect (meaning this test can be used) for the duration of the COVID-19 declaration under Section 564(b)(1) of the Act, 21 U.S.C.section 360bbb-3(b)(1), unless the authorization is terminated  or revoked sooner.       Influenza A by PCR  NEGATIVE NEGATIVE Final   Influenza B by PCR NEGATIVE NEGATIVE Final    Comment: (NOTE) The Xpert Xpress SARS-CoV-2/FLU/RSV plus assay is intended as an aid in the diagnosis of influenza from Nasopharyngeal swab specimens and should not be used as a sole basis for treatment. Nasal washings and aspirates are unacceptable for Xpert Xpress SARS-CoV-2/FLU/RSV testing.  Fact Sheet for Patients: bloggercourse.com  Fact Sheet for Healthcare Providers: seriousbroker.it  This test is not yet approved or cleared by the [place]  FDA and has been authorized for detection and/or diagnosis of SARS-CoV-2 by FDA under an Emergency Use Authorization (EUA). This EUA will remain in effect (meaning this test can be used) for the duration of the COVID-19 declaration under Section 564(b)(1) of the Act, 21 U.S.C. section 360bbb-3(b)(1), unless the authorization is terminated or revoked.     Resp Syncytial Virus by PCR NEGATIVE NEGATIVE Final    Comment: (NOTE) Fact Sheet for Patients: bloggercourse.com  Fact Sheet for Healthcare Providers: seriousbroker.it  This test is not yet approved or cleared by the [place]  FDA and has been authorized for detection and/or diagnosis of  SARS-CoV-2 by FDA under an Emergency Use Authorization (EUA). This EUA will remain in effect (meaning this test can be used) for the duration of the COVID-19 declaration under Section 564(b)(1) of the Act, 21 U.S.C. section 360bbb-3(b)(1), unless the authorization is terminated or revoked.  Performed at [facility], [address], [place], [place] [address]          Radiology Studies: US  LIMITED ULTRASOUND INCLUDING AXILLA LEFT BREAST  Result Date: 11/21/2023 CLINICAL DATA:  [id number] Breast abscess [id number] EXAM: ULTRASOUND OF THE LEFT BREAST ULTRASOUND OF THE RIGHT BREAST COMPARISON:  May 01, 2023  FINDINGS: Targeted ultrasound was performed of the site of concern in the RIGHT breast. Within the dermis there is a heterogeneous predominantly anechoic fluid collection at 12 o'clock 1 cm from the nipple. This measures 11 x 4 x 7 mm. No internal blood flow. Targeted ultrasound was performed of the site of concern in the LEFT breast. At 9 o'clock 2 cm from the nipple there is a superficial planar heterogeneous fluid collection. This measures 2.2 x 2.7 x 0.3 cm. No internal blood flow. IMPRESSION: 1. At the site of palpable concern in the RIGHT breast, there is a benign appearing dermal fluid collection measuring 11 mm. Differential considerations include inflamed epidermal inclusion cyst or pustule. 2. At the site of palpable concern in the LEFT breast, there is a benign appearing 2.7 cm dermal fluid collection. Differential considerations include sequela of and inflamed epidermal inclusion cyst or developing pustule. RECOMMENDATION: Recommend conservative clinical management of bilateral intradermal small fluid collections with clinical follow-up to resolution. Should symptoms worsen or persist after conservative management, follow-up ultrasound with potential ultrasound-guided aspiration for bacterial speciation at a dedicated breast center could be considered on a nonemergent basis. BI-RADS CATEGORY  2: Benign. Electronically Signed   By: Corean Salter M.D.   On: 11/21/2023 12:23   US  LIMITED ULTRASOUND INCLUDING AXILLA RIGHT BREAST Result Date: 11/21/2023 CLINICAL DATA:  [id number] Breast abscess [id number] EXAM: ULTRASOUND OF THE LEFT BREAST ULTRASOUND OF THE RIGHT BREAST COMPARISON:  May 01, 2023 FINDINGS: Targeted ultrasound was performed of the site of concern in the RIGHT breast. Within the dermis there is a heterogeneous predominantly anechoic fluid collection at 12 o'clock 1 cm from the nipple. This measures 11 x 4 x 7 mm. No internal blood flow. Targeted ultrasound was performed of the site of  concern in the LEFT breast. At 9 o'clock 2 cm from the nipple there is a superficial planar heterogeneous fluid collection. This measures 2.2 x 2.7 x 0.3 cm. No internal blood flow. IMPRESSION: 1. At the site of palpable concern in the RIGHT breast, there is a benign appearing dermal fluid collection measuring 11 mm. Differential considerations include inflamed epidermal inclusion cyst or pustule. 2. At the site of palpable concern in the LEFT breast, there is a benign appearing 2.7 cm dermal fluid collection. Differential considerations include sequela of and inflamed epidermal inclusion cyst or developing pustule. RECOMMENDATION: Recommend conservative clinical management of bilateral intradermal small fluid collections with clinical follow-up to resolution. Should symptoms worsen or persist after conservative management, follow-up ultrasound with potential ultrasound-guided aspiration for bacterial speciation at a dedicated breast center could be considered on a nonemergent basis. BI-RADS CATEGORY  2: Benign. Electronically Signed   By: Corean Salter M.D.   On: 11/21/2023 12:23   DG Abd Portable 1V Result Date: 11/21/2023 CLINICAL DATA:  Abdominal pain. EXAM: PORTABLE ABDOMEN - 1 VIEW COMPARISON:  10/28/2021 FINDINGS: No gaseous bowel dilatation  to suggest obstruction. Gas is seen scattered through nondilated small bowel and along the course of a nondilated colon. No substantial stool volume. No unexpected abdominopelvic calcification. Visualized bony anatomy unremarkable. IMPRESSION: Nonobstructive bowel gas pattern. Electronically Signed   By: Camellia Candle M.D.   On: 11/21/2023 11:01   CT HEAD WO CONTRAST ( ) Result Date: 11/21/2023 EXAM: CT HEAD WITHOUT CONTRAST 11/21/2023 12:10:41 AM TECHNIQUE: CT of the head was performed without the administration of intravenous contrast. Automated exposure control, iterative reconstruction, and/or weight based adjustment of the mA/kV was utilized to reduce  the radiation dose to as low as reasonably achievable. COMPARISON: CT head 06/05/2023 CLINICAL HISTORY: Syncope/presyncope, cerebrovascular cause suspected FINDINGS: BRAIN AND VENTRICLES: No acute hemorrhage. No evidence of acute infarct. No hydrocephalus. No extra-axial collection. No mass effect or midline shift. ORBITS: No acute abnormality. SINUSES: Bilaterally trace ethmoid, sphenoid, maxillary sinus mucosal thickening. The mastoid air cells are clear. SOFT TISSUES AND SKULL: No acute soft tissue abnormality. No skull fracture. IMPRESSION: 1. No acute intracranial abnormality. Electronically signed by: Morgane Naveau MD 11/21/2023 12:20 AM EST RP Workstation: [id number]   DG Chest 2 View Result Date: 11/20/2023 CLINICAL DATA:  Possible seizure-like activity. EXAM: CHEST - 2 VIEW COMPARISON:  May 23, 2023 FINDINGS: The heart size and mediastinal contours are within normal limits. Both lungs are clear. The visualized skeletal structures are unremarkable. IMPRESSION: No active cardiopulmonary disease. Electronically Signed   By: Suzen Dials M.D.   On: 11/20/2023 20:10        Scheduled Meds:  amoxicillin -clavulanate  1 tablet Oral Q12H   aspirin  EC  81 mg Oral Daily   doxycycline   100 mg Oral Q12H   ferrous sulfate  325 mg Oral BID WC   hydrocortisone  sod succinate (SOLU-CORTEF ) inj  50 mg Intravenous Q12H   insulin  aspart  0-9 Units Subcutaneous TID WC   insulin  glargine-yfgn  6 Units Subcutaneous BID   methIMAzole   40 mg Oral BID   propranolol   80 mg Oral TID   scopolamine   1 patch Transdermal Q72H   Continuous Infusions:  sodium chloride  75 mL/hr at 11/21/23 1332     LOS: 2 days      Anthony CHRISTELLA Pouch, MD [facility] Pager [phone]  If 7PM-7AM, please contact night-coverage [email or web address] 11/22/2023, 8:28 AM

## 2023-11-23 LAB — GLUCOSE, CAPILLARY
Glucose-Capillary: 186 mg/dL — ABNORMAL HIGH (ref 70–99)
Glucose-Capillary: 216 mg/dL — ABNORMAL HIGH (ref 70–99)

## 2023-11-23 MED ORDER — METHIMAZOLE 10 MG PO TABS: 40.0000 mg | ORAL_TABLET | Freq: Two times a day (BID) | ORAL | 0 refills | Status: AC

## 2023-11-23 MED ORDER — FLUTICASONE PROPIONATE 50 MCG/ACT NA SUSP
2.0000 | Freq: Every day | NASAL | Status: DC
  Filled 2023-11-23: qty 16

## 2023-11-23 MED ORDER — DOXYCYCLINE HYCLATE 100 MG PO TABS: 100.0000 mg | ORAL_TABLET | Freq: Two times a day (BID) | ORAL | 0 refills | Status: AC

## 2023-11-23 MED ORDER — FERROUS SULFATE 325 (65 FE) MG PO TABS: 325.0000 mg | ORAL_TABLET | Freq: Two times a day (BID) | ORAL | 0 refills | Status: AC

## 2023-11-23 MED ORDER — LORATADINE 10 MG PO TABS: 10.0000 mg | ORAL_TABLET | Freq: Every day | ORAL | Status: DC

## 2023-11-23 MED ORDER — AMOXICILLIN-POT CLAVULANATE 875-125 MG PO TABS: 1.0000 | ORAL_TABLET | Freq: Two times a day (BID) | ORAL | 0 refills | Status: AC

## 2023-11-23 NOTE — Plan of Care (Signed)
  Problem: Education: Goal: Knowledge of General Education information will improve Description: Including pain rating scale, medication(s)/side effects and non-pharmacologic comfort measures Outcome: Progressing   Problem: Health Behavior/Discharge Planning: Goal: Ability to manage health-related needs will improve Outcome: Progressing   Problem: Clinical Measurements: Goal: Diagnostic test results will improve Outcome: Progressing   Problem: Activity: Goal: Risk for activity intolerance will decrease Outcome: Progressing   Problem: Nutrition: Goal: Adequate nutrition will be maintained Outcome: Progressing   Problem: Coping: Goal: Level of anxiety will decrease Outcome: Progressing   Problem: Elimination: Goal: Will not experience complications related to bowel motility Outcome: Progressing   Problem: Skin Integrity: Goal: Risk for impaired skin integrity will decrease Outcome: Progressing   Problem: Education: Goal: Ability to describe self-care measures that may prevent or decrease complications (Diabetes Survival Skills Education) will improve Outcome: Progressing   Problem: Coping: Goal: Ability to adjust to condition or change in health will improve Outcome: Progressing   Problem: Fluid Volume: Goal: Ability to maintain a balanced intake and output will improve Outcome: Progressing   Problem: Metabolic: Goal: Ability to maintain appropriate glucose levels will improve Outcome: Progressing   Problem: Nutritional: Goal: Progress toward achieving an optimal weight will improve Outcome: Progressing

## 2023-11-23 NOTE — Discharge Summary (Signed)
 Physician Discharge Summary  Nancy [name] FMW:[id number] DOB: 06/21/2000 DOA: 11/20/2023  PCP: Shelley Lax, FNP  Admit date: 11/20/2023 Discharge date: 11/23/2023  Admitted From: home Disposition:  home   Recommendations for Outpatient Follow-up:  Follow up with PCP in 1-2 weeks F/u w/ endo within 1 week Will need referral from PCP for dermatology to evaluate for possible hidradenitis suppurativa   Home Health: no  Equipment/Devices:  Discharge Condition: stable  CODE STATUS: full  Diet recommendation: Heart Healthy / Carb Modified    Brief/Interim Summary: HPI was taken from Dr. Hilma: Fidencio [name] is a 23 y.o. female with medical history significant of  thyrotoxicity(graves disease) ,  psychogenic non-epileptic seizures,  insulin  dependent T2DM, gastroparesis, polysubstance abuse, chronic abdominal pain, conversion disorder, who presents with near syncope.    Pt states that she has had several episodes of near syncope in the past several days,  feeling that she is going to pass out, but she is not very sure if she truly passed out or not. No unilateral numbness or tingling in extremities.  No facial droop or slurred speech.  She has front chest discomfort and palpitation.  Her heart rate is up to 140s in ED.  No SOB.  She has nasal congestion and a mild dry cough.  No fever or chills.  She has nausea and chronic diffuse abdominal pain, no vomiting or diarrhea.  No symptoms of UTI.  She states that she is taking her thyroid  medication, but admited missing some doses.   Data reviewed independently and ED Course: pt was found to have TSH < 0.100, Free T4=4.61, WBC 12.4, GFR> 60, negative PCR for COVID, flu and RSV, troponin 85- > 70, chest x-ray negative.  Patient is admitted to stepdown as inpatient.  Discharge Diagnoses:  Principal Problem:   Thyrotoxicosis Active Problems:   Near syncope   Myocardial injury   Gastroparesis due to DM (HCC)   Diabetes mellitus without  complication (HCC)   Leukocytosis   Anxiety and depression   Obesity (BMI 30-39.9)  Thyrotoxicosis: TSH < 0.100, Free T4=4.61. Likely due to medication noncompliance. Taking propranolol  and methimazole , but admitted having missed some doses. D/c steroids. Continue on methimazole , propranolol . Will need to f/u outpatient w/ endo   Near syncope: Unclear etiology.  No focal neurodeficit on physical examination. Hx of conversion disorder and psychogenic seizure.   Possible hidradenitis suppurativa: of b/l breasts. See US  of L& R breast. Continue on augmentin , doxy x 3 days more. Will need f/u outpatient w/ dermatology    Myocardial injury: Troponin 85 --> 70, likely demand ischemia.   Gastroparesis: secondary to DM2:   No diarrhea or vomiting. Zofran  prn. Scopolamine  patch    DM2: well controlled, HbA1c 6.0. Continue on glargine, SSI w/ accuchecks    Leukocytosis: resolved    Depression: severity unknown. Not taking any meds    Obesity: BMI 30.4. Would benefit from weight loss   Discharge Instructions  Discharge Instructions     Diet - low sodium heart healthy   Complete by: As directed    Diet Carb Modified   Complete by: As directed    Discharge instructions   Complete by: As directed    F/u w/ PCP in 1-2 weeks. F/u w/ endocrinology within 1 week. Need to get a referral from your PCP to see dermatology for evaluation for possible hidradenitis suppurativa.   Increase activity slowly   Complete by: As directed       Allergies as of  11/23/2023       Reactions   Acetaminophen  Other (See Comments)   Avoids due to liver   Ibuprofen  Other (See Comments)   GI MD said to not take this anymore        Medication List     STOP taking these medications    Baqsimi  Two Pack 3 MG/DOSE Powd Generic drug: Glucagon    Lactulose  20 GM/30ML Soln   polyethylene glycol powder 17 GM/SCOOP powder Commonly known as: GLYCOLAX /MIRALAX        TAKE these medications     amoxicillin -clavulanate 875-125 MG tablet Commonly known as: AUGMENTIN  Take 1 tablet by mouth every 12 (twelve) hours for 3 days.   doxycycline  100 MG tablet Commonly known as: VIBRA -TABS Take 1 tablet (100 mg total) by mouth every 12 (twelve) hours for 3 days.   ferrous sulfate 325 (65 FE) MG tablet Take 1 tablet (325 mg total) by mouth 2 (two) times daily with a meal.   Insulin  Aspart FlexPen 100 UNIT/ML Commonly known as: NOVOLOG  Inject 15 Units into the skin as directed. Sliding scale insulin  as follows: BG 150-200  = 2 units BG  200-250 = 4 units BG  250-300 = 6 units BG  300-350 = 8 units BG  350-400 = 10 units   insulin  glargine 100 unit/mL Sopn Commonly known as: LANTUS  Inject 8 Units into the skin 2 (two) times daily.   methimazole  10 MG tablet Commonly known as: TAPAZOLE  Take 4 tablets (40 mg total) by mouth 2 (two) times daily. What changed: how much to take   metoCLOPramide  10 MG tablet Commonly known as: REGLAN  Take 1 tablet (10 mg total) by mouth 3 (three) times daily before meals.   oxyCODONE  5 MG immediate release tablet Commonly known as: Oxy IR/ROXICODONE  Take 5 mg by mouth every 6 (six) hours as needed for severe pain (pain score 7-10).   propranolol  80 MG tablet Commonly known as: INDERAL  Take 1 tablet (80 mg total) by mouth 3 (three) times daily.   QUEtiapine  100 MG tablet Commonly known as: SEROQUEL  Take 1 tablet (100 mg total) by mouth at bedtime as needed (insomnia).   senna-docusate 8.6-50 MG tablet Commonly known as: Senokot-S Take 2 tablets by mouth 2 (two) times daily.        Allergies  Allergen Reactions   Acetaminophen  Other (See Comments)    Avoids due to liver   Ibuprofen  Other (See Comments)    GI MD said to not take this anymore    Consultations:    Procedures/Studies: US  LIMITED ULTRASOUND INCLUDING AXILLA LEFT BREAST  Result Date: 11/21/2023 CLINICAL DATA:  [id number] Breast abscess [id number] EXAM: ULTRASOUND OF THE  LEFT BREAST ULTRASOUND OF THE RIGHT BREAST COMPARISON:  May 01, 2023 FINDINGS: Targeted ultrasound was performed of the site of concern in the RIGHT breast. Within the dermis there is a heterogeneous predominantly anechoic fluid collection at 12 o'clock 1 cm from the nipple. This measures 11 x 4 x 7 mm. No internal blood flow. Targeted ultrasound was performed of the site of concern in the LEFT breast. At 9 o'clock 2 cm from the nipple there is a superficial planar heterogeneous fluid collection. This measures 2.2 x 2.7 x 0.3 cm. No internal blood flow. IMPRESSION: 1. At the site of palpable concern in the RIGHT breast, there is a benign appearing dermal fluid collection measuring 11 mm. Differential considerations include inflamed epidermal inclusion cyst or pustule. 2. At the site of palpable concern in the LEFT breast, there  is a benign appearing 2.7 cm dermal fluid collection. Differential considerations include sequela of and inflamed epidermal inclusion cyst or developing pustule. RECOMMENDATION: Recommend conservative clinical management of bilateral intradermal small fluid collections with clinical follow-up to resolution. Should symptoms worsen or persist after conservative management, follow-up ultrasound with potential ultrasound-guided aspiration for bacterial speciation at a dedicated breast center could be considered on a nonemergent basis. BI-RADS CATEGORY  2: Benign. Electronically Signed   By: Corean Salter M.D.   On: 11/21/2023 12:23   US  LIMITED ULTRASOUND INCLUDING AXILLA RIGHT BREAST Result Date: 11/21/2023 CLINICAL DATA:  [id number] Breast abscess [id number] EXAM: ULTRASOUND OF THE LEFT BREAST ULTRASOUND OF THE RIGHT BREAST COMPARISON:  May 01, 2023 FINDINGS: Targeted ultrasound was performed of the site of concern in the RIGHT breast. Within the dermis there is a heterogeneous predominantly anechoic fluid collection at 12 o'clock 1 cm from the nipple. This measures 11 x 4 x 7 mm. No  internal blood flow. Targeted ultrasound was performed of the site of concern in the LEFT breast. At 9 o'clock 2 cm from the nipple there is a superficial planar heterogeneous fluid collection. This measures 2.2 x 2.7 x 0.3 cm. No internal blood flow. IMPRESSION: 1. At the site of palpable concern in the RIGHT breast, there is a benign appearing dermal fluid collection measuring 11 mm. Differential considerations include inflamed epidermal inclusion cyst or pustule. 2. At the site of palpable concern in the LEFT breast, there is a benign appearing 2.7 cm dermal fluid collection. Differential considerations include sequela of and inflamed epidermal inclusion cyst or developing pustule. RECOMMENDATION: Recommend conservative clinical management of bilateral intradermal small fluid collections with clinical follow-up to resolution. Should symptoms worsen or persist after conservative management, follow-up ultrasound with potential ultrasound-guided aspiration for bacterial speciation at a dedicated breast center could be considered on a nonemergent basis. BI-RADS CATEGORY  2: Benign. Electronically Signed   By: Corean Salter M.D.   On: 11/21/2023 12:23   DG Abd Portable 1V Result Date: 11/21/2023 CLINICAL DATA:  Abdominal pain. EXAM: PORTABLE ABDOMEN - 1 VIEW COMPARISON:  10/28/2021 FINDINGS: No gaseous bowel dilatation to suggest obstruction. Gas is seen scattered through nondilated small bowel and along the course of a nondilated colon. No substantial stool volume. No unexpected abdominopelvic calcification. Visualized bony anatomy unremarkable. IMPRESSION: Nonobstructive bowel gas pattern. Electronically Signed   By: Camellia Candle M.D.   On: 11/21/2023 11:01   CT HEAD WO CONTRAST ( ) Result Date: 11/21/2023 EXAM: CT HEAD WITHOUT CONTRAST 11/21/2023 12:10:41 AM TECHNIQUE: CT of the head was performed without the administration of intravenous contrast. Automated exposure control, iterative  reconstruction, and/or weight based adjustment of the mA/kV was utilized to reduce the radiation dose to as low as reasonably achievable. COMPARISON: CT head 06/05/2023 CLINICAL HISTORY: Syncope/presyncope, cerebrovascular cause suspected FINDINGS: BRAIN AND VENTRICLES: No acute hemorrhage. No evidence of acute infarct. No hydrocephalus. No extra-axial collection. No mass effect or midline shift. ORBITS: No acute abnormality. SINUSES: Bilaterally trace ethmoid, sphenoid, maxillary sinus mucosal thickening. The mastoid air cells are clear. SOFT TISSUES AND SKULL: No acute soft tissue abnormality. No skull fracture. IMPRESSION: 1. No acute intracranial abnormality. Electronically signed by: Morgane Naveau MD 11/21/2023 12:20 AM EST RP Workstation: [id number]   DG Chest 2 View Result Date: 11/20/2023 CLINICAL DATA:  Possible seizure-like activity. EXAM: CHEST - 2 VIEW COMPARISON:  May 23, 2023 FINDINGS: The heart size and mediastinal contours are within normal limits. Both lungs are clear. The visualized skeletal structures  are unremarkable. IMPRESSION: No active cardiopulmonary disease. Electronically Signed   By: Suzen Dials M.D.   On: 11/20/2023 20:10   (Echo, Carotid, EGD, Colonoscopy, ERCP)    Subjective: Pt c/o runny nose   Discharge Exam: Vitals:   11/23/23 0810 11/23/23 1202  BP: (!) 168/94 131/62  Pulse: 84 91  Resp: 16 18  Temp: 98.5 F (36.9 C) 99.3 F (37.4 C)  SpO2: 100% 100%   Vitals:   11/22/23 2059 11/23/23 0417 11/23/23 0810 11/23/23 1202  BP: 136/67 (!) 149/79 (!) 168/94 131/62  Pulse: 93 81 84 91  Resp: 18 20 16 18   Temp: 97.8 F (36.6 C) 98.1 F (36.7 C) 98.5 F (36.9 C) 99.3 F (37.4 C)  TempSrc:   Oral Oral  SpO2: 100% 100% 100% 100%  Weight:      Height:        General: Pt is alert, awake, not in acute distress Cardiovascular: S1/S2 +, no rubs, no gallops Respiratory: CTA bilaterally, no wheezing, no rhonchi Abdominal: Soft, NT,obese, bowel sounds  + Extremities:  no cyanosis    The results of significant diagnostics from this hospitalization (including imaging, microbiology, ancillary and laboratory) are listed below for reference.     Microbiology: Recent Results (from the past 240 hours)  Resp panel by RT-PCR (RSV, Flu A&B, Covid) Anterior Nasal Swab     Status: None   Collection Time: 11/20/23  7:46 PM   Specimen: Anterior Nasal Swab  Result Value Ref Range Status   SARS Coronavirus 2 by RT PCR NEGATIVE NEGATIVE Final    Comment: (NOTE) SARS-CoV-2 target nucleic acids are NOT DETECTED.  The SARS-CoV-2 RNA is generally detectable in upper respiratory specimens during the acute phase of infection. The lowest concentration of SARS-CoV-2 viral copies this assay can detect is 138 copies/mL. A negative result does not preclude SARS-Cov-2 infection and should not be used as the sole basis for treatment or other patient management decisions. A negative result may occur with  improper specimen collection/handling, submission of specimen other than nasopharyngeal swab, presence of viral mutation(s) within the areas targeted by this assay, and inadequate number of viral copies(<138 copies/mL). A negative result must be combined with clinical observations, patient history, and epidemiological information. The expected result is Negative.  Fact Sheet for Patients:  bloggercourse.com  Fact Sheet for Healthcare Providers:  seriousbroker.it  This test is no t yet approved or cleared by the [place]  FDA and  has been authorized for detection and/or diagnosis of SARS-CoV-2 by FDA under an Emergency Use Authorization (EUA). This EUA will remain  in effect (meaning this test can be used) for the duration of the COVID-19 declaration under Section 564(b)(1) of the Act, 21 U.S.C.section 360bbb-3(b)(1), unless the authorization is terminated  or revoked sooner.       Influenza A  by PCR NEGATIVE NEGATIVE Final   Influenza B by PCR NEGATIVE NEGATIVE Final    Comment: (NOTE) The Xpert Xpress SARS-CoV-2/FLU/RSV plus assay is intended as an aid in the diagnosis of influenza from Nasopharyngeal swab specimens and should not be used as a sole basis for treatment. Nasal washings and aspirates are unacceptable for Xpert Xpress SARS-CoV-2/FLU/RSV testing.  Fact Sheet for Patients: bloggercourse.com  Fact Sheet for Healthcare Providers: seriousbroker.it  This test is not yet approved or cleared by the [place]  FDA and has been authorized for detection and/or diagnosis of SARS-CoV-2 by FDA under an Emergency Use Authorization (EUA). This EUA will remain in effect (meaning this  test can be used) for the duration of the COVID-19 declaration under Section 564(b)(1) of the Act, 21 U.S.C. section 360bbb-3(b)(1), unless the authorization is terminated or revoked.     Resp Syncytial Virus by PCR NEGATIVE NEGATIVE Final    Comment: (NOTE) Fact Sheet for Patients: bloggercourse.com  Fact Sheet for Healthcare Providers: seriousbroker.it  This test is not yet approved or cleared by the [place]  FDA and has been authorized for detection and/or diagnosis of SARS-CoV-2 by FDA under an Emergency Use Authorization (EUA). This EUA will remain in effect (meaning this test can be used) for the duration of the COVID-19 declaration under Section 564(b)(1) of the Act, 21 U.S.C. section 360bbb-3(b)(1), unless the authorization is terminated or revoked.  Performed at [facility], [address] [place], [place] [address]      Labs: BNP (last 3 results) No results for input(s): BNP in the last 8760 hours. Basic Metabolic Panel: Recent Labs  Lab 11/20/23 1939 11/21/23 0443  NA 136 140  K 3.5 3.8  CL 100 105  CO2 25 25  GLUCOSE 227* 225*  BUN 10 13   CREATININE 0.58 0.46  CALCIUM  9.4 8.6*  MG  --  2.0   Liver Function Tests: No results for input(s): AST, ALT, ALKPHOS, BILITOT, PROT, ALBUMIN in the last 168 hours. No results for input(s): LIPASE, AMYLASE in the last 168 hours. No results for input(s): AMMONIA in the last 168 hours. CBC: Recent Labs  Lab 11/20/23 1939 11/21/23 0443  WBC 12.4* 8.4  HGB 13.4 11.3*  HCT 40.0 34.0*  MCV 74.6* 74.9*  PLT 251 204   Cardiac Enzymes: No results for input(s): CKTOTAL, CKMB, CKMBINDEX, TROPONINI in the last 168 hours. BNP: Invalid input(s): POCBNP CBG: Recent Labs  Lab 11/22/23 1233 11/22/23 1623 11/22/23 2059 11/23/23 0750 11/23/23 1205  GLUCAP 259* 124* 151* 186* 216*   D-Dimer No results for input(s): DDIMER in the last 72 hours. Hgb A1c No results for input(s): HGBA1C in the last 72 hours. Lipid Profile No results for input(s): CHOL, HDL, LDLCALC, TRIG, CHOLHDL, LDLDIRECT in the last 72 hours. Thyroid  function studies Recent Labs    11/20/23 1939  TSH <0.100*   Anemia work up Recent Labs    11/22/23 0402  FERRITIN 80  TIBC 263  IRON 17*   Urinalysis    Component Value Date/Time   COLORURINE YELLOW (A) 09/14/2023 1600   APPEARANCEUR HAZY (A) 09/14/2023 1600   LABSPEC 1.007 09/14/2023 1600   PHURINE 6.0 09/14/2023 1600   GLUCOSEU >=500 (A) 09/14/2023 1600   HGBUR NEGATIVE 09/14/2023 1600   BILIRUBINUR NEGATIVE 09/14/2023 1600   BILIRUBINUR negative 07/13/2018 1651   KETONESUR 5 (A) 09/14/2023 1600   PROTEINUR 30 (A) 09/14/2023 1600   UROBILINOGEN 0.2 07/13/2018 1651   UROBILINOGEN 1.0 07/09/2017 1324   NITRITE NEGATIVE 09/14/2023 1600   LEUKOCYTESUR NEGATIVE 09/14/2023 1600   Sepsis Labs Recent Labs  Lab 11/20/23 1939 11/21/23 0443  WBC 12.4* 8.4   Microbiology Recent Results (from the past 240 hours)  Resp panel by RT-PCR (RSV, Flu A&B, Covid) Anterior Nasal Swab     Status: None   Collection  Time: 11/20/23  7:46 PM   Specimen: Anterior Nasal Swab  Result Value Ref Range Status   SARS Coronavirus 2 by RT PCR NEGATIVE NEGATIVE Final    Comment: (NOTE) SARS-CoV-2 target nucleic acids are NOT DETECTED.  The SARS-CoV-2 RNA is generally detectable in upper respiratory specimens during the acute phase of infection.  The lowest concentration of SARS-CoV-2 viral copies this assay can detect is 138 copies/mL. A negative result does not preclude SARS-Cov-2 infection and should not be used as the sole basis for treatment or other patient management decisions. A negative result may occur with  improper specimen collection/handling, submission of specimen other than nasopharyngeal swab, presence of viral mutation(s) within the areas targeted by this assay, and inadequate number of viral copies(<138 copies/mL). A negative result must be combined with clinical observations, patient history, and epidemiological information. The expected result is Negative.  Fact Sheet for Patients:  bloggercourse.com  Fact Sheet for Healthcare Providers:  seriousbroker.it  This test is no t yet approved or cleared by the [place]  FDA and  has been authorized for detection and/or diagnosis of SARS-CoV-2 by FDA under an Emergency Use Authorization (EUA). This EUA will remain  in effect (meaning this test can be used) for the duration of the COVID-19 declaration under Section 564(b)(1) of the Act, 21 U.S.C.section 360bbb-3(b)(1), unless the authorization is terminated  or revoked sooner.       Influenza A by PCR NEGATIVE NEGATIVE Final   Influenza B by PCR NEGATIVE NEGATIVE Final    Comment: (NOTE) The Xpert Xpress SARS-CoV-2/FLU/RSV plus assay is intended as an aid in the diagnosis of influenza from Nasopharyngeal swab specimens and should not be used as a sole basis for treatment. Nasal washings and aspirates are unacceptable for Xpert Xpress  SARS-CoV-2/FLU/RSV testing.  Fact Sheet for Patients: bloggercourse.com  Fact Sheet for Healthcare Providers: seriousbroker.it  This test is not yet approved or cleared by the [place]  FDA and has been authorized for detection and/or diagnosis of SARS-CoV-2 by FDA under an Emergency Use Authorization (EUA). This EUA will remain in effect (meaning this test can be used) for the duration of the COVID-19 declaration under Section 564(b)(1) of the Act, 21 U.S.C. section 360bbb-3(b)(1), unless the authorization is terminated or revoked.     Resp Syncytial Virus by PCR NEGATIVE NEGATIVE Final    Comment: (NOTE) Fact Sheet for Patients: bloggercourse.com  Fact Sheet for Healthcare Providers: seriousbroker.it  This test is not yet approved or cleared by the [place]  FDA and has been authorized for detection and/or diagnosis of SARS-CoV-2 by FDA under an Emergency Use Authorization (EUA). This EUA will remain in effect (meaning this test can be used) for the duration of the COVID-19 declaration under Section 564(b)(1) of the Act, 21 U.S.C. section 360bbb-3(b)(1), unless the authorization is terminated or revoked.  Performed at [facility], [address], [place], [place] [address]      Time coordinating discharge: 37 minutes  SIGNED:   Anthony CHRISTELLA Pouch, MD  [facility] 11/23/2023, 12:55 PM Pager   If 7PM-7AM, please contact night-coverage [email or web address]

## 2023-11-23 NOTE — Inpatient Diabetes Management (Signed)
 Inpatient Diabetes Program Recommendations  AACE/ADA: New Consensus Statement on Inpatient Glycemic Control (2015)  Target Ranges:  Prepandial:   less than 140 mg/dL      Peak postprandial:   less than 180 mg/dL (1-2 hours)      Critically ill patients:  140 - 180 mg/dL   Lab Results  Component Value Date   GLUCAP 186 (H) 11/23/2023   HGBA1C 6.0 (H) 09/14/2023    Review of Glycemic Control  Latest Reference Range & Units 11/22/23 08:39 11/22/23 12:33 11/22/23 16:23 11/22/23 20:59 11/23/23 07:50  Glucose-Capillary 70 - 99 mg/dL 820 (H) 740 (H) 875 (H) 151 (H) 186 (H)   Diabetes history: Per Endo notes/ LADA managed as Type 1 Outpatient Diabetes medications:  Novolog  2-10 units tid with meals  Lantus  8 units bid (per patient she was taking sometimes up to Lantus  65 units daily) Current orders for Inpatient glycemic control:  Novolog  0-9 units tid with meals  Semglee  6 units bid  Inpatient Diabetes Program Recommendations:    CBG's currently well controlled.   Spoke with patient by phone to clarify home doses of Lantus .  She states that she sometimes takes Lantus  65 units daily if blood sugars are running high. I told patient that may be too much for her and that her last visit with Endocrinology only ordered Lantus  8 units once daily with Novolog  scale at meals.  She states that PCP told her she could increase dose up or reduce dose down based on blood sugars.  Explained that basal insulin  should be adjusted based on fasting blood sugar (without food).  Recommended prompt follow-up with endocrinology and for her to follow what the discharge summary says for insulin  at discharge.  Told her that Lantus  65 units sounds like it could be too much based on her trends in the hospital and MD orders from endocrinology.  She does wear a Dexcom CGM and states that her highest blood sugars are right after she eats (190's).  Discussed A1C results and that goal CBG's should be between 80-150 mg/dL.   Patient appreciative of information.    Thanks,  Randall Bullocks, RN, BC-ADM Inpatient Diabetes Coordinator Pager [phone]  (8a-5p)  -  Thanks,  Randall Bullocks, RN, BC-ADM Inpatient Diabetes Coordinator Pager [phone]  (8a-5p)
